# Patient Record
Sex: Male | Born: 1945 | Race: White | Hispanic: No | Marital: Married | Smoking: Never smoker
Health system: Southern US, Community
[De-identification: ages and names within clinical notes are randomized; demographics above are authoritative.]

## PROBLEM LIST (undated history)

## (undated) DIAGNOSIS — Z87898 Personal history of other specified conditions: Secondary | ICD-10-CM

## (undated) DIAGNOSIS — E785 Hyperlipidemia, unspecified: Secondary | ICD-10-CM

## (undated) DIAGNOSIS — Z9861 Coronary angioplasty status: Secondary | ICD-10-CM

## (undated) DIAGNOSIS — Z9889 Other specified postprocedural states: Secondary | ICD-10-CM

## (undated) DIAGNOSIS — R011 Cardiac murmur, unspecified: Secondary | ICD-10-CM

## (undated) DIAGNOSIS — R112 Nausea with vomiting, unspecified: Secondary | ICD-10-CM

## (undated) DIAGNOSIS — I10 Essential (primary) hypertension: Secondary | ICD-10-CM

## (undated) DIAGNOSIS — Z955 Presence of coronary angioplasty implant and graft: Secondary | ICD-10-CM

## (undated) DIAGNOSIS — R7303 Prediabetes: Secondary | ICD-10-CM

## (undated) DIAGNOSIS — I2119 ST elevation (STEMI) myocardial infarction involving other coronary artery of inferior wall: Secondary | ICD-10-CM

## (undated) DIAGNOSIS — K469 Unspecified abdominal hernia without obstruction or gangrene: Secondary | ICD-10-CM

## (undated) DIAGNOSIS — G4733 Obstructive sleep apnea (adult) (pediatric): Secondary | ICD-10-CM

## (undated) DIAGNOSIS — Z889 Allergy status to unspecified drugs, medicaments and biological substances status: Secondary | ICD-10-CM

## (undated) DIAGNOSIS — N401 Enlarged prostate with lower urinary tract symptoms: Secondary | ICD-10-CM

## (undated) DIAGNOSIS — E119 Type 2 diabetes mellitus without complications: Secondary | ICD-10-CM

## (undated) DIAGNOSIS — I2510 Atherosclerotic heart disease of native coronary artery without angina pectoris: Secondary | ICD-10-CM

## (undated) DIAGNOSIS — M1990 Unspecified osteoarthritis, unspecified site: Secondary | ICD-10-CM

## (undated) HISTORY — DX: Unspecified osteoarthritis, unspecified site: M19.90

## (undated) HISTORY — DX: Hyperlipidemia, unspecified: E78.5

## (undated) HISTORY — PX: INGUINAL HERNIA REPAIR: SUR1180

## (undated) HISTORY — DX: Atherosclerotic heart disease of native coronary artery without angina pectoris: I25.10

## (undated) HISTORY — DX: ST elevation (STEMI) myocardial infarction involving other coronary artery of inferior wall: I21.19

## (undated) HISTORY — DX: Essential (primary) hypertension: I10

## (undated) HISTORY — DX: Atherosclerotic heart disease of native coronary artery without angina pectoris: Z98.61

## (undated) HISTORY — DX: Allergy status to unspecified drugs, medicaments and biological substances: Z88.9

---

## 1980-05-25 HISTORY — PX: LUMBAR DISC SURGERY: SHX700

## 1999-09-01 ENCOUNTER — Encounter: Payer: Self-pay | Admitting: General Surgery

## 1999-09-02 ENCOUNTER — Ambulatory Visit (HOSPITAL_COMMUNITY): Admission: RE | Admit: 1999-09-02 | Discharge: 1999-09-02 | Payer: Self-pay | Admitting: General Surgery

## 1999-09-02 HISTORY — PX: INGUINAL HERNIA REPAIR: SHX194

## 1999-11-23 ENCOUNTER — Ambulatory Visit (HOSPITAL_COMMUNITY): Admission: RE | Admit: 1999-11-23 | Discharge: 1999-11-23 | Payer: Self-pay | Admitting: Specialist

## 1999-11-23 ENCOUNTER — Encounter: Payer: Self-pay | Admitting: Specialist

## 1999-12-19 ENCOUNTER — Encounter: Payer: Self-pay | Admitting: Specialist

## 1999-12-19 ENCOUNTER — Ambulatory Visit (HOSPITAL_COMMUNITY): Admission: RE | Admit: 1999-12-19 | Discharge: 1999-12-19 | Payer: Self-pay | Admitting: Specialist

## 2004-05-22 ENCOUNTER — Encounter: Admission: RE | Admit: 2004-05-22 | Discharge: 2004-05-22 | Payer: Self-pay | Admitting: Cardiology

## 2004-05-25 DIAGNOSIS — I2510 Atherosclerotic heart disease of native coronary artery without angina pectoris: Secondary | ICD-10-CM

## 2004-05-25 HISTORY — DX: Atherosclerotic heart disease of native coronary artery without angina pectoris: I25.10

## 2004-05-25 HISTORY — PX: CORONARY ANGIOPLASTY WITH STENT PLACEMENT: SHX49

## 2004-05-27 HISTORY — PX: CARDIAC CATHETERIZATION: SHX172

## 2004-06-18 ENCOUNTER — Ambulatory Visit (HOSPITAL_COMMUNITY): Admission: RE | Admit: 2004-06-18 | Discharge: 2004-06-19 | Payer: Self-pay | Admitting: Cardiology

## 2004-06-18 DIAGNOSIS — I2510 Atherosclerotic heart disease of native coronary artery without angina pectoris: Secondary | ICD-10-CM | POA: Insufficient documentation

## 2004-06-18 DIAGNOSIS — Z9861 Coronary angioplasty status: Secondary | ICD-10-CM

## 2004-06-18 HISTORY — PX: CORONARY ANGIOPLASTY WITH STENT PLACEMENT: SHX49

## 2005-12-18 HISTORY — PX: KNEE ARTHROSCOPY W/ MENISCAL REPAIR: SHX1877

## 2006-12-09 ENCOUNTER — Inpatient Hospital Stay (HOSPITAL_COMMUNITY): Admission: EM | Admit: 2006-12-09 | Discharge: 2006-12-15 | Payer: Self-pay | Admitting: Emergency Medicine

## 2006-12-22 ENCOUNTER — Encounter: Admission: RE | Admit: 2006-12-22 | Discharge: 2006-12-22 | Payer: Self-pay | Admitting: Orthopedic Surgery

## 2008-08-24 IMAGING — CR DG THORACIC SPINE 2V
3 series · 3 of 3 positions shown · non-contrast
Comparison: None.

Exam: Thoracic spine, 2 views.

HISTORY: Fell from ladder.

[t t-spine a.p.]
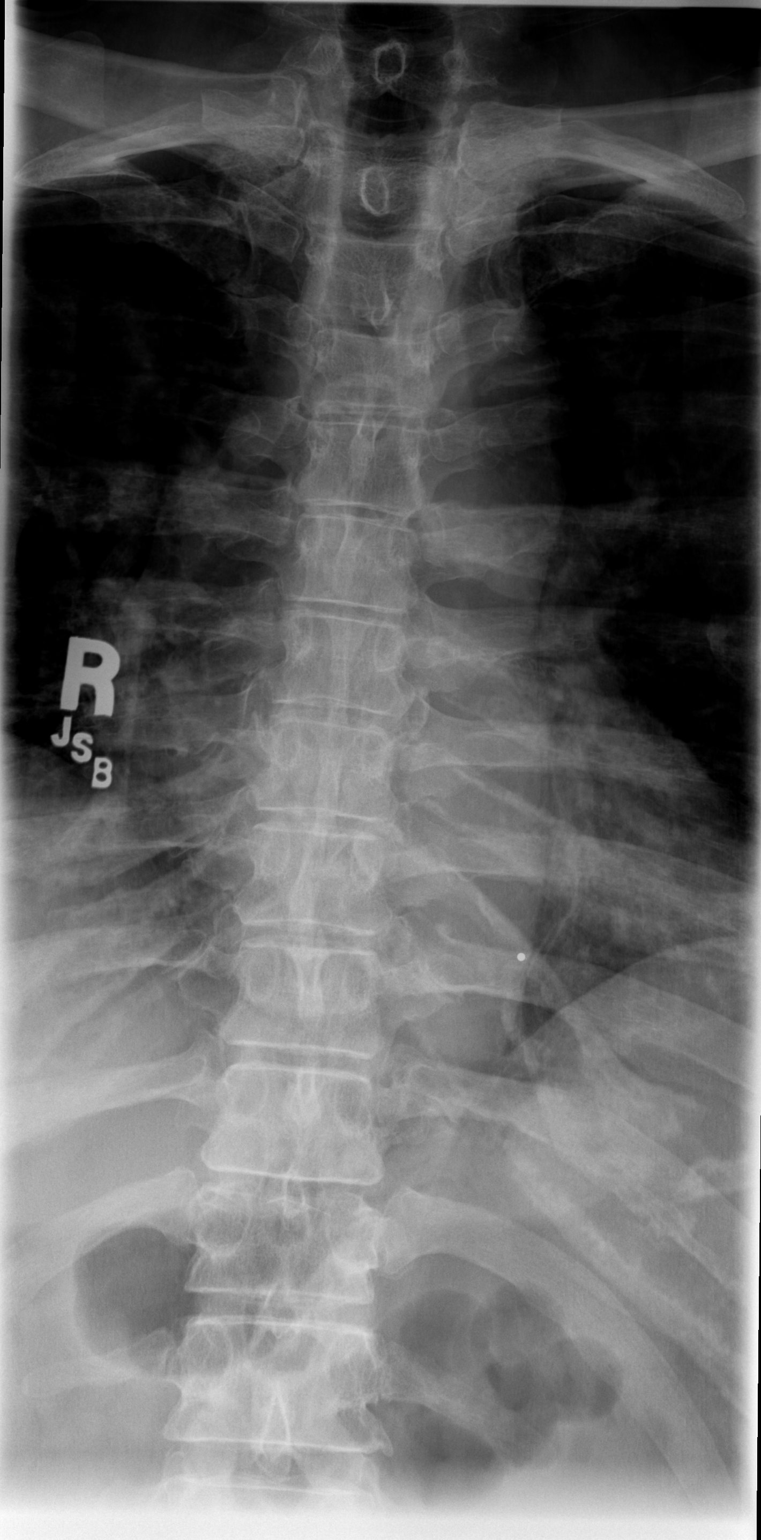

[t t-spine lat]
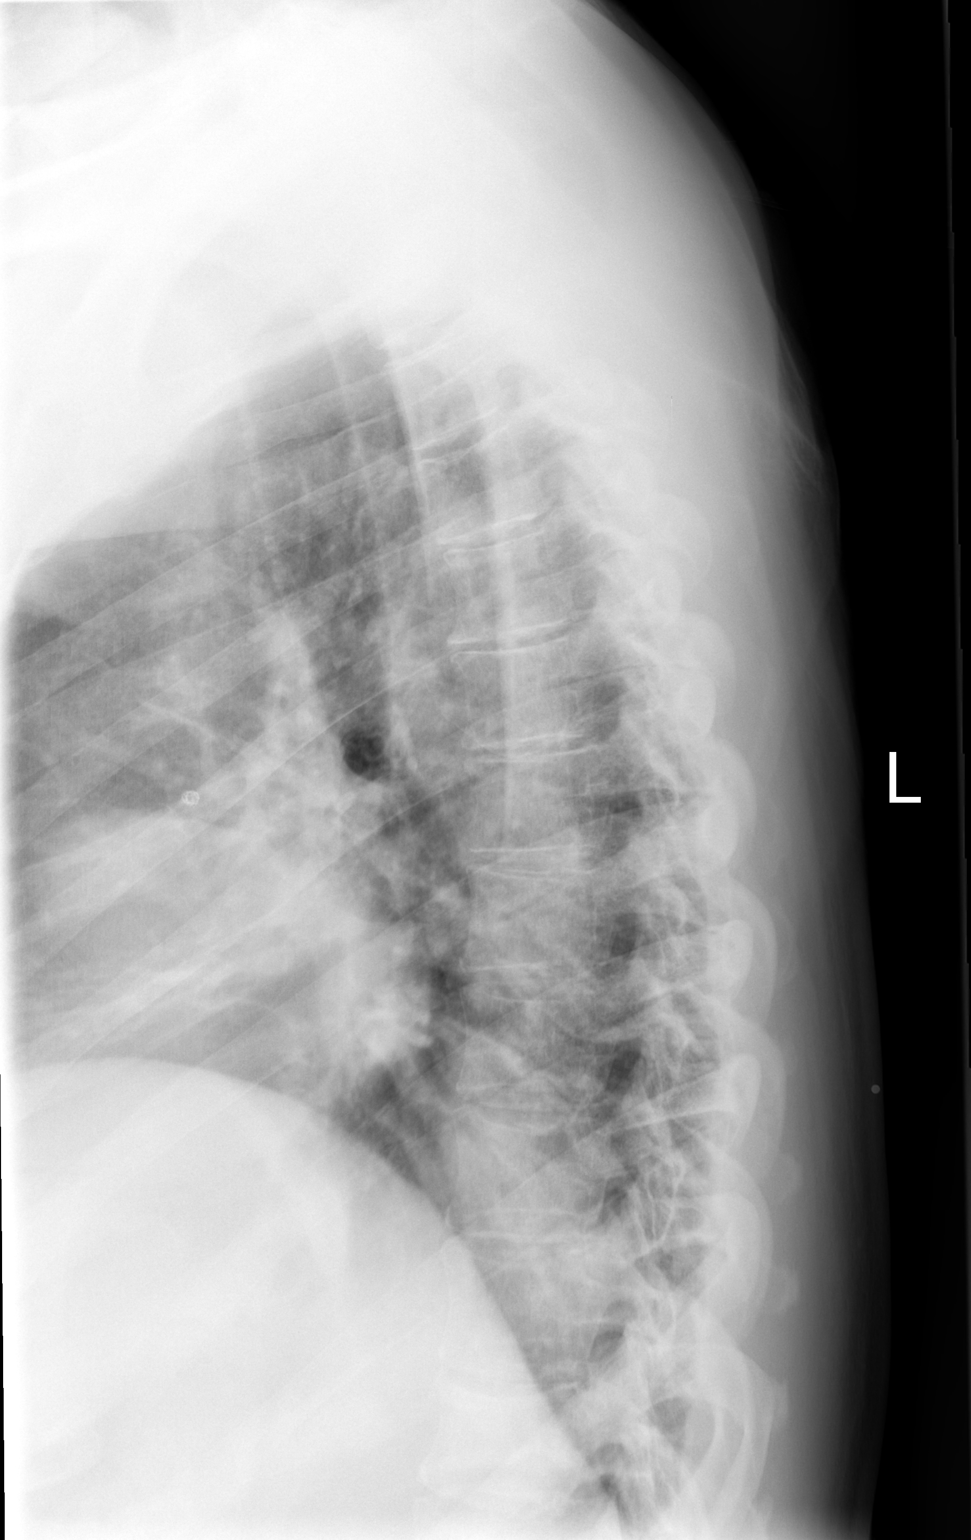

[t swimmers]
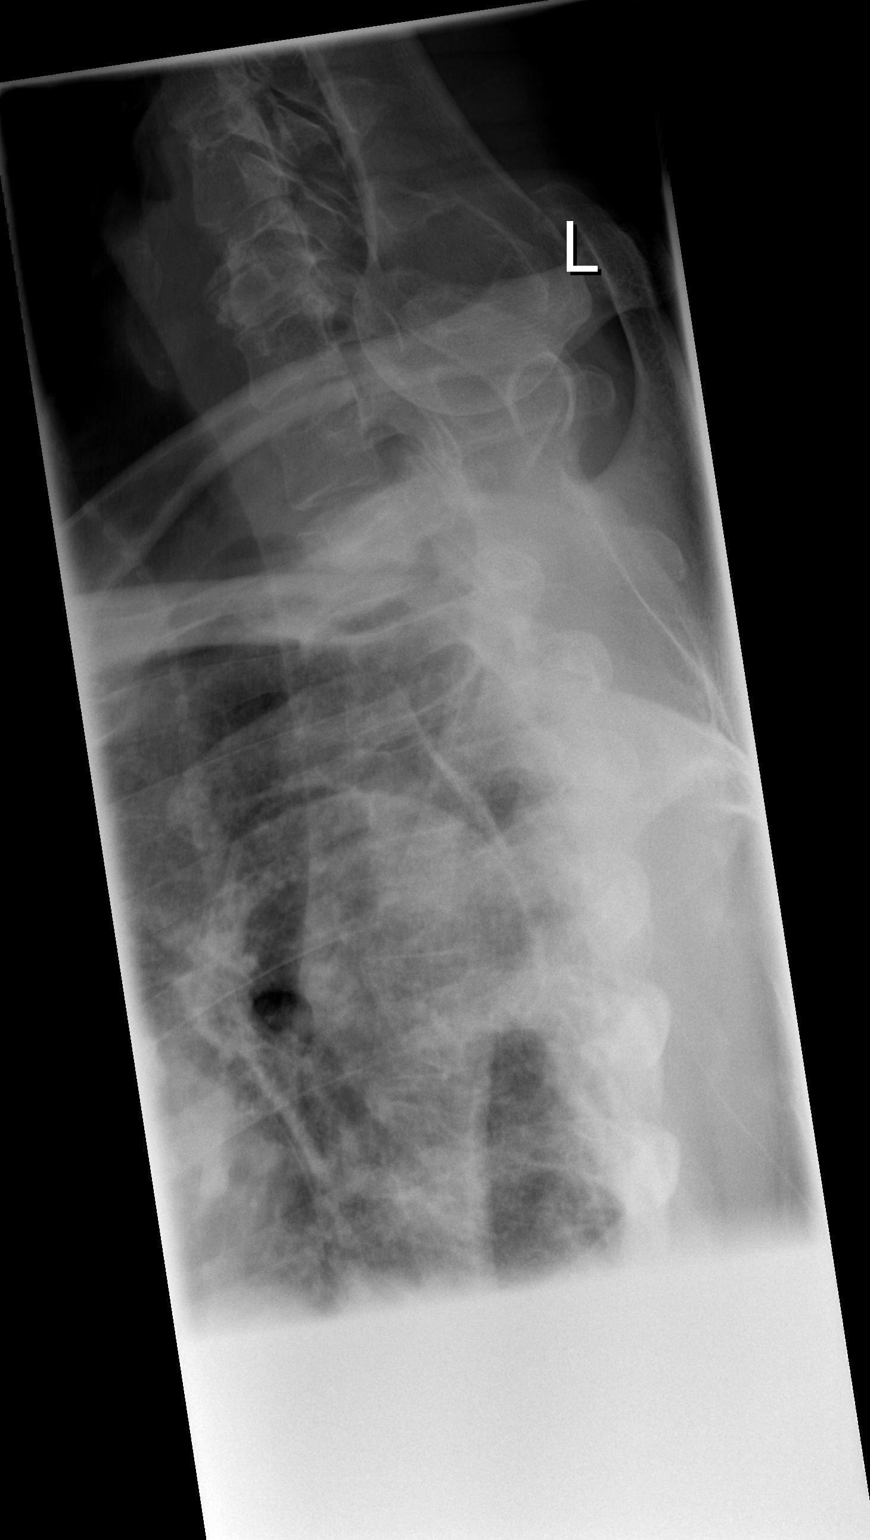

[3 of 3 positions shown; findings below may reference images not displayed]

FINDINGS: The alignment of the thoracic spine is normal.

The vertebral body heights and disc spaces are well-preserved.

The pedicles and spinous processes appear intact.
IMPRESSION: 1. No acute findings.

## 2008-08-24 IMAGING — CR DG PELVIS 3+V JUDET
3 series · 3 of 3 positions shown · non-contrast
Comparison: CT 12/08/2006

CLINICAL DATA: 61-year-old, transforaminal sacral fracture on the right.
 PELVIS - 3 VIEW:

[t pelvis a.p.]
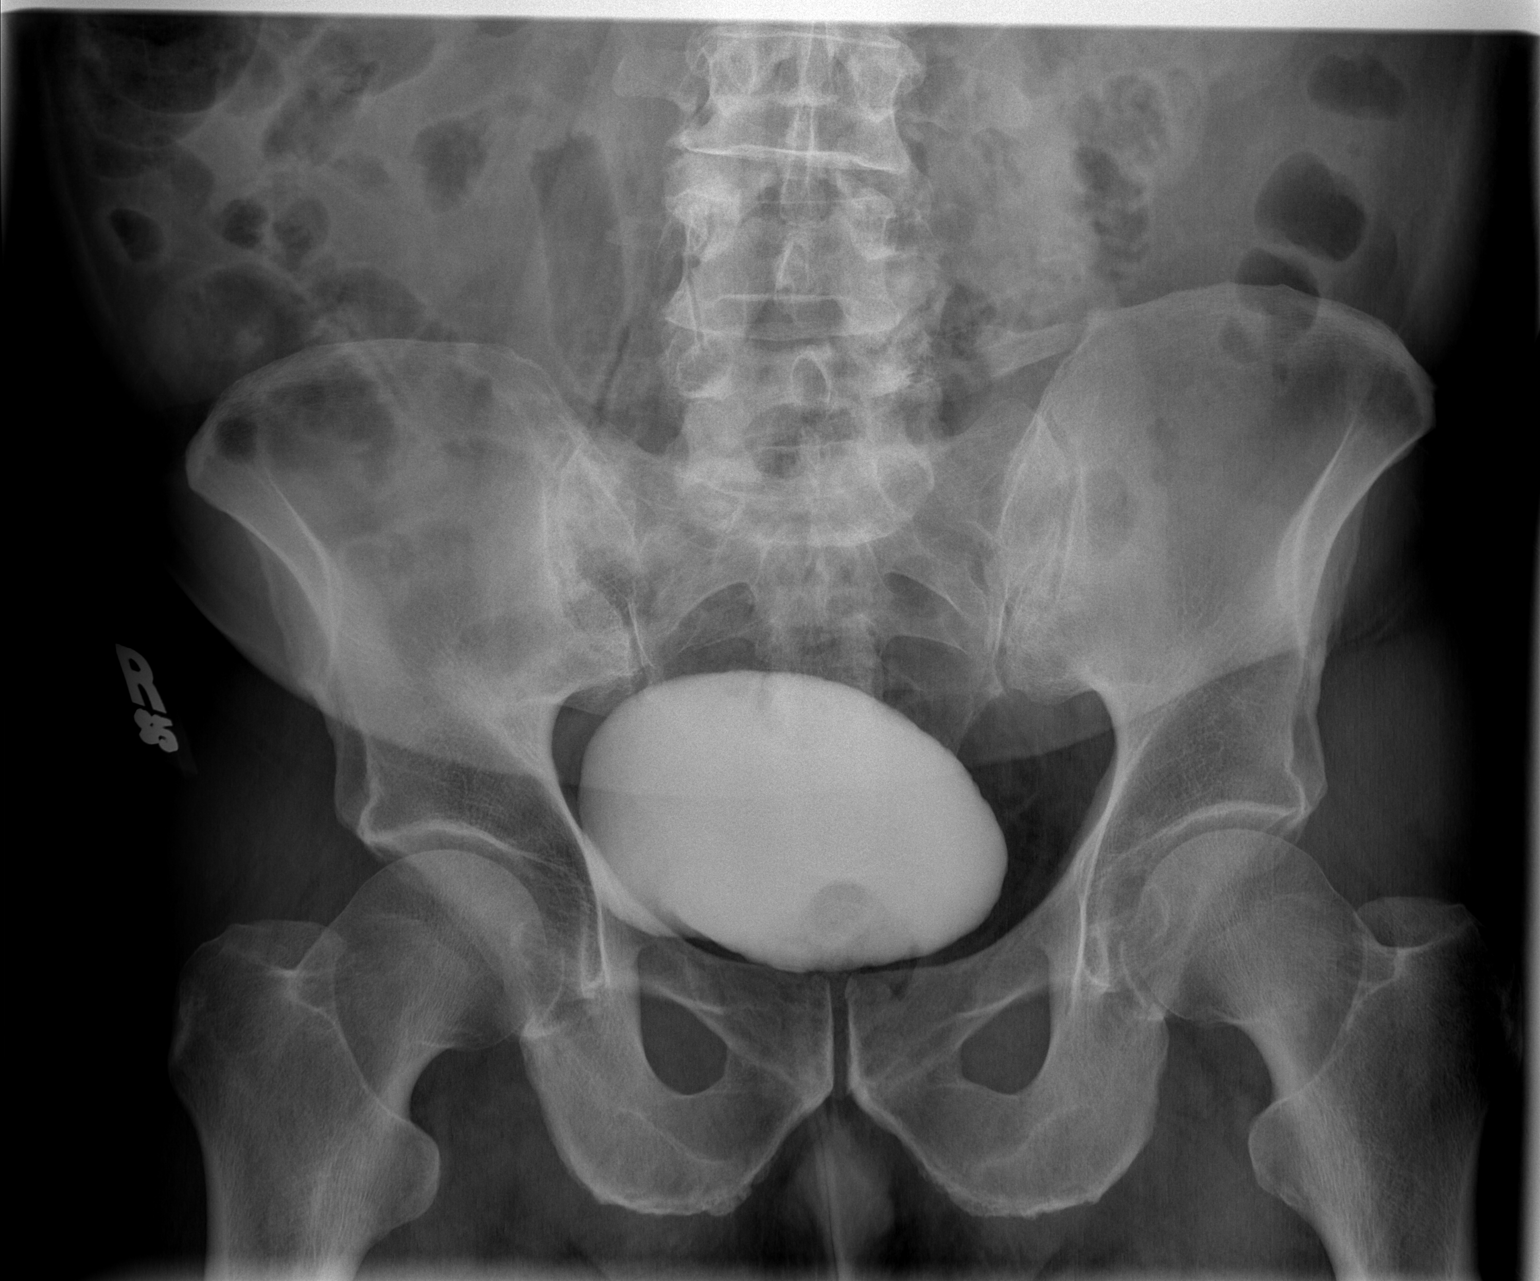

[t judet (1 of 2)]
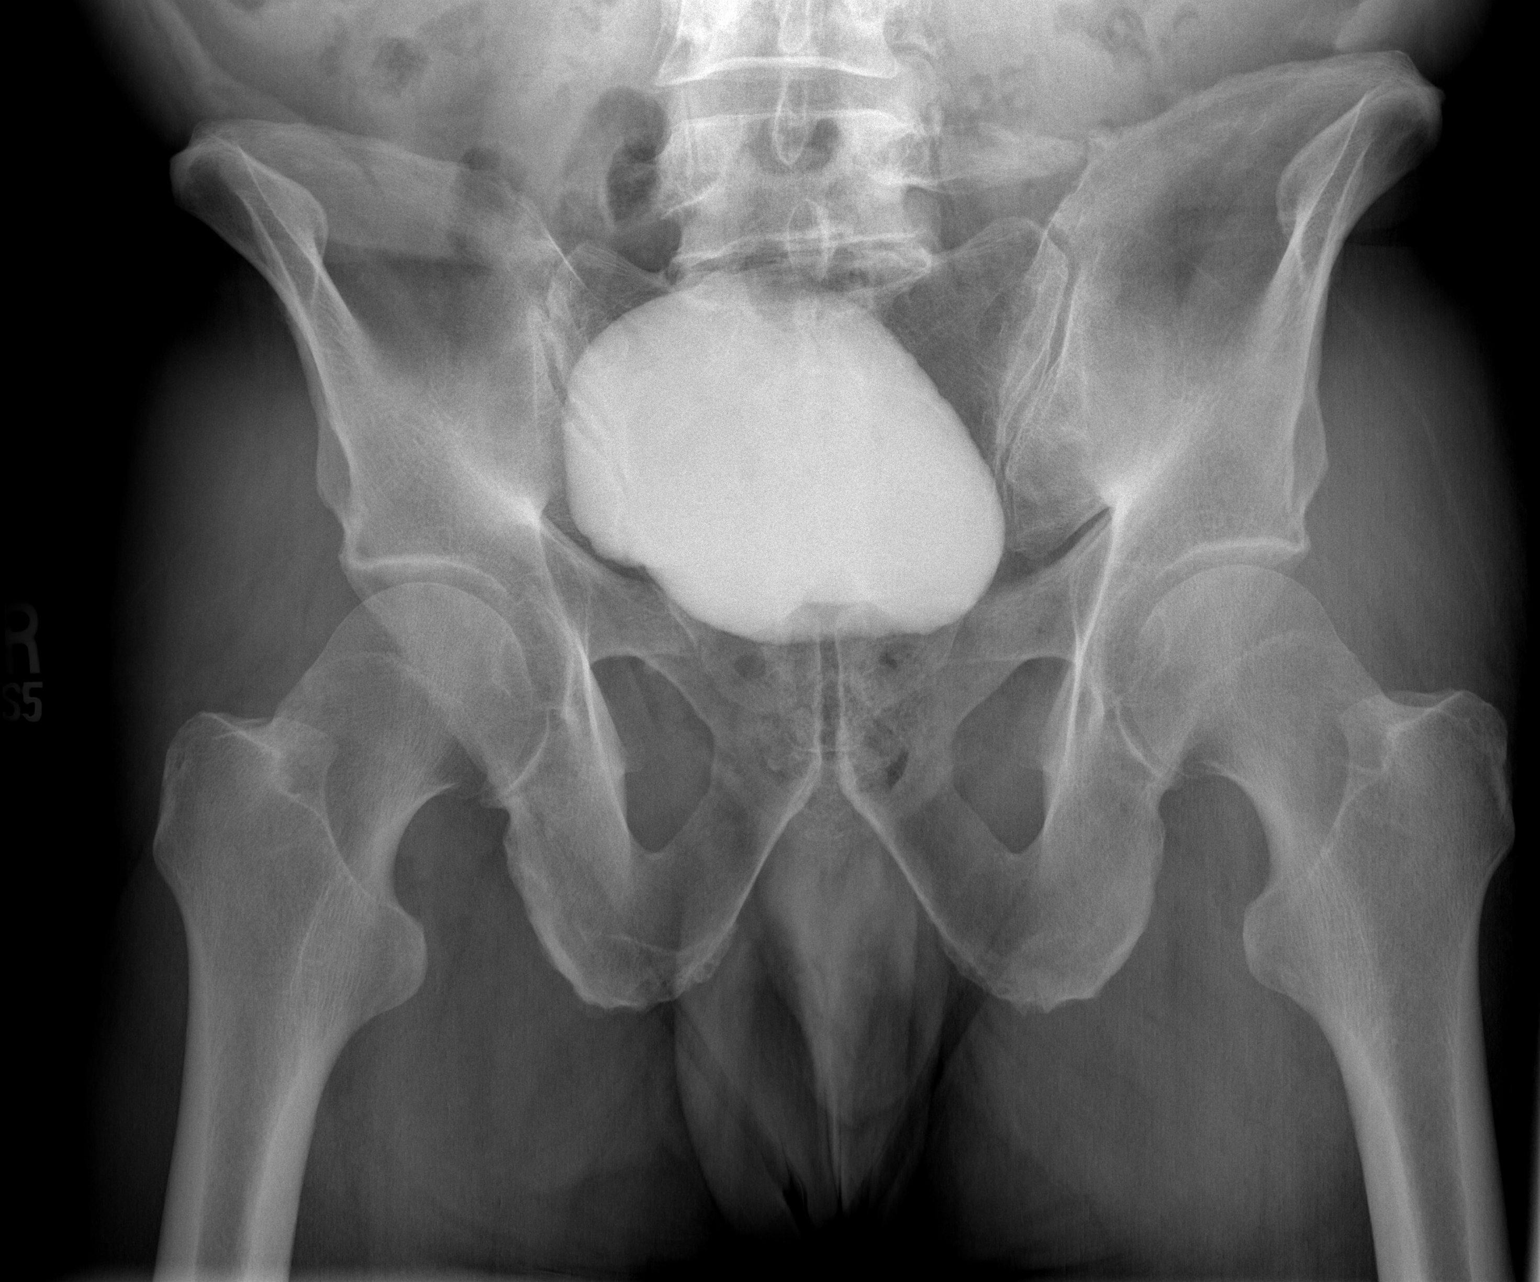

[t judet (2 of 2)]
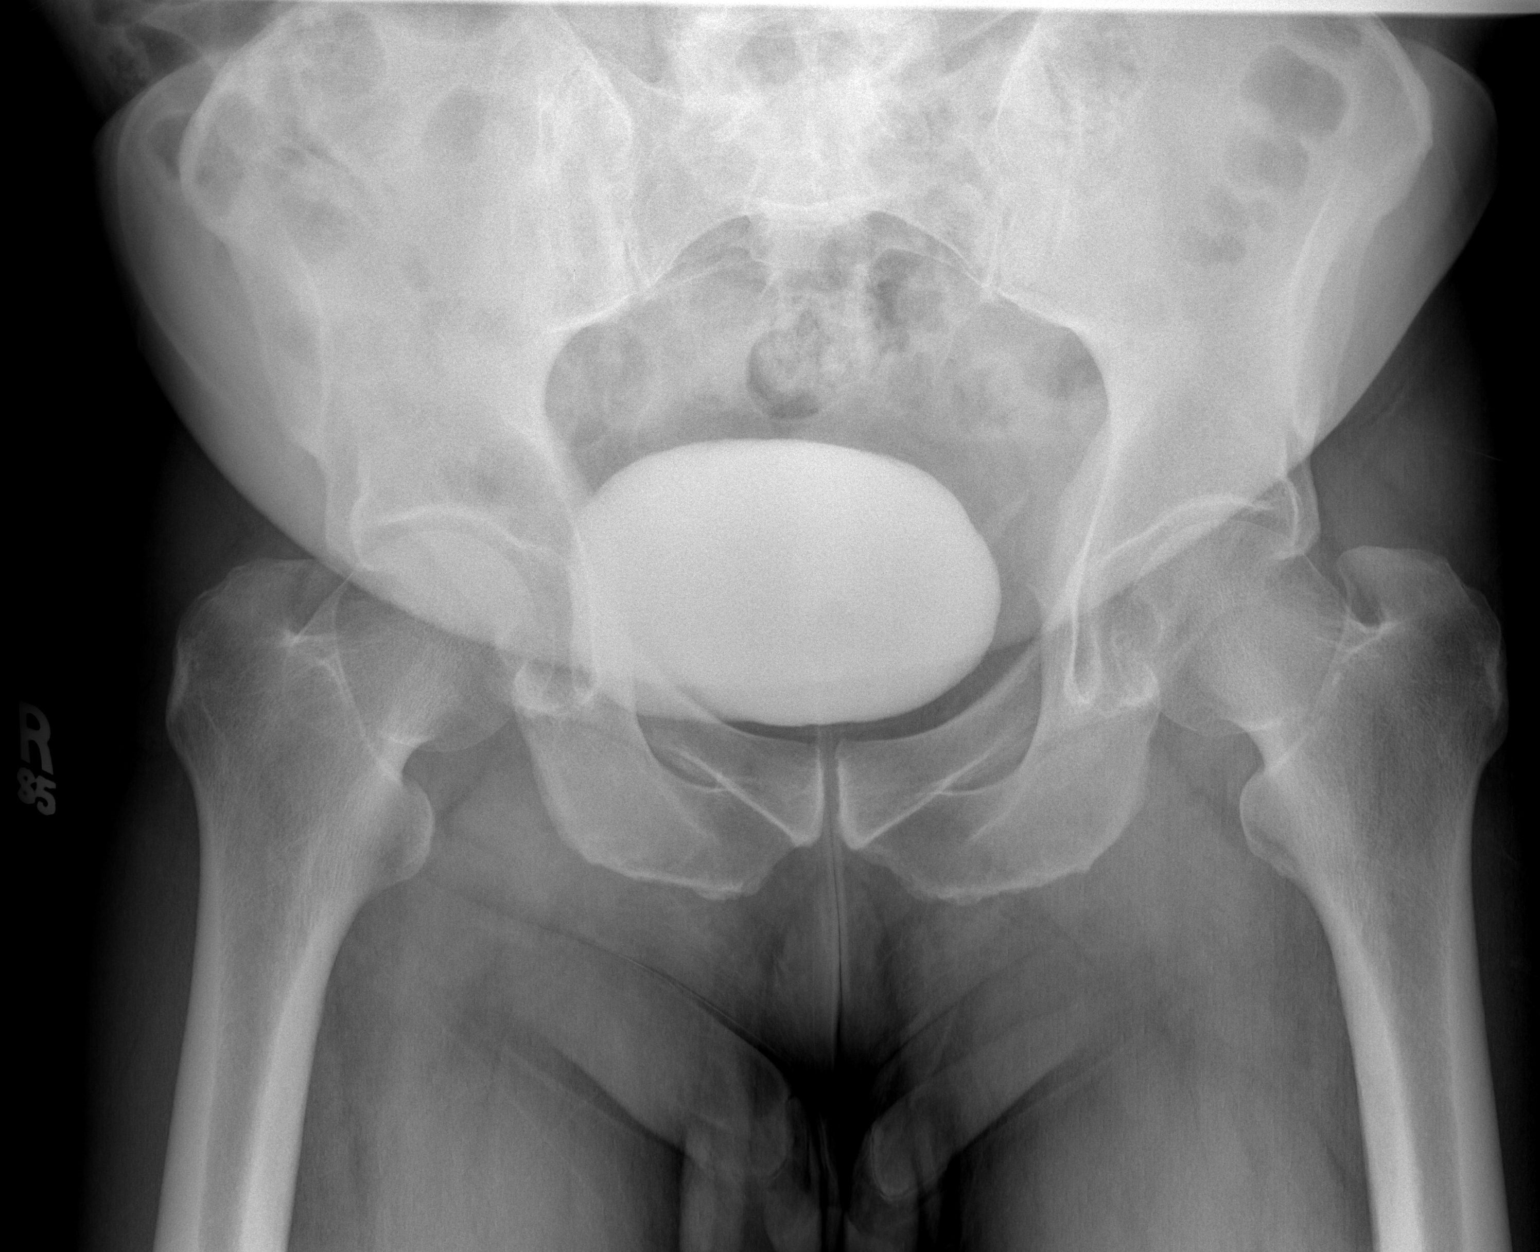

[3 of 3 positions shown; findings below may reference images not displayed]

FINDINGS: SI joints and pubic symphysis are intact.  The right sacral fractures are not demonstrated.  Both hips are located.  Contrast noted in the bladder.
IMPRESSION: Right sacral fractures seen on CT are not demonstrated on plain films.

## 2008-08-24 IMAGING — CT CT PELVIS W/ CM
2 of 5 series · 16 of 46 positions shown, 18 images · IV contrast (75ML OMNI 300)
Comparison: None

ABDOMEN CT WITH CONTRAST

CLINICAL DATA: Fall
TECHNIQUE: Multidetector CT imaging of the abdomen and pelvis was performed
following the standard protocol during bolus administration of intravenous
contrast.

Contrast:  75 cc Omnipaque 300

[Series 2: routine abdomen · axial · 0.89mm/px · z∈[-535,-45]mm · 13 of 110 slices shown, 15 images]
[im 6/110  soft-tissue]
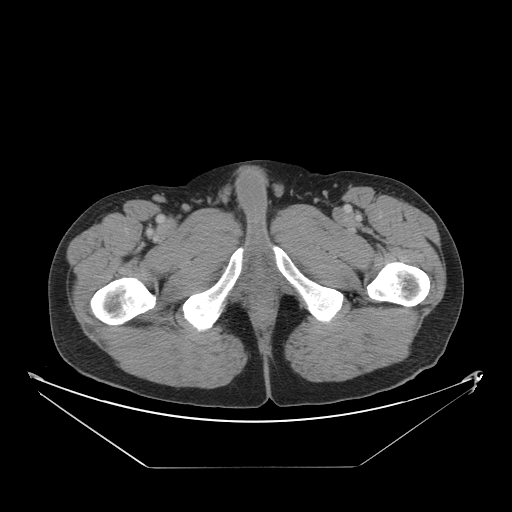
[im 6/110  bone]
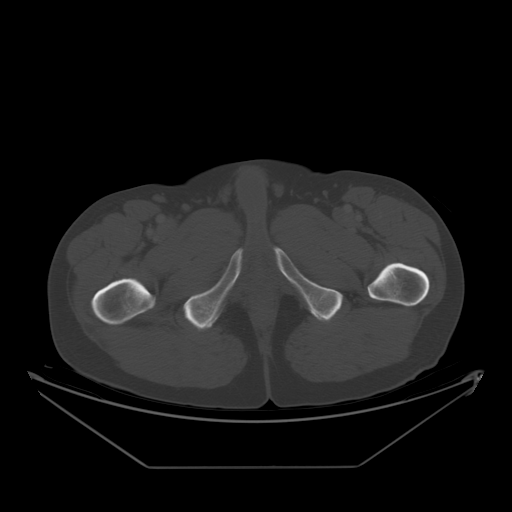
[im 17/110  soft-tissue]
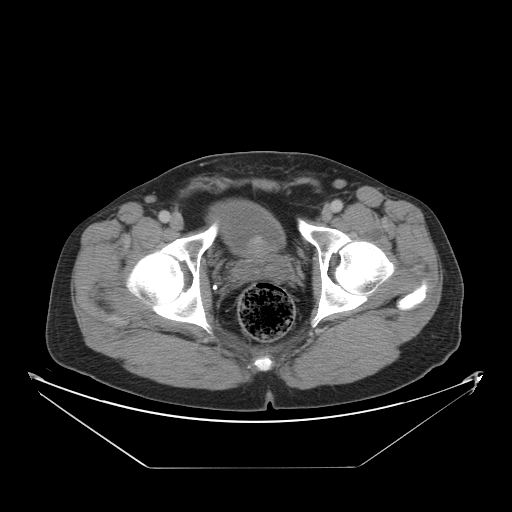
[im 22/110  soft-tissue]
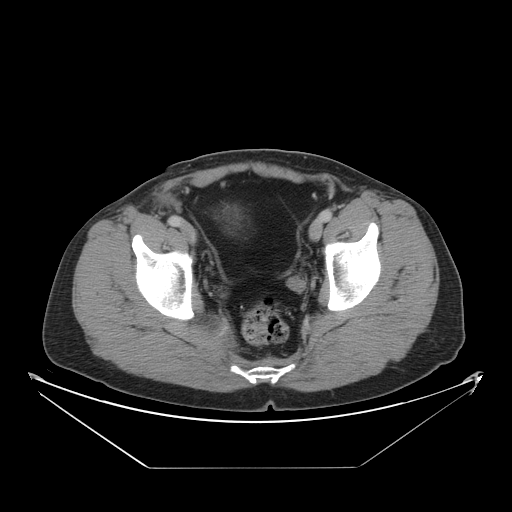
[im 33/110  soft-tissue]
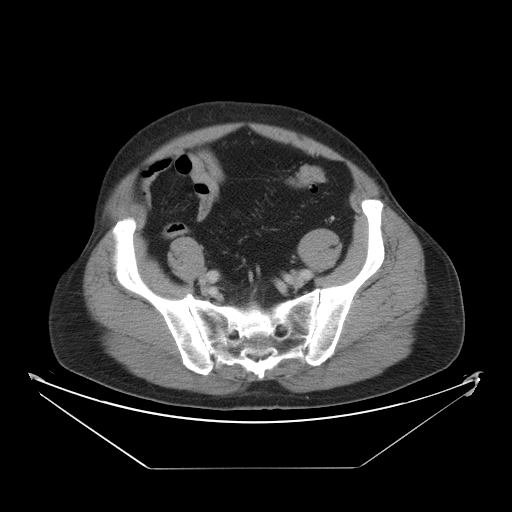
[im 39/110  soft-tissue]
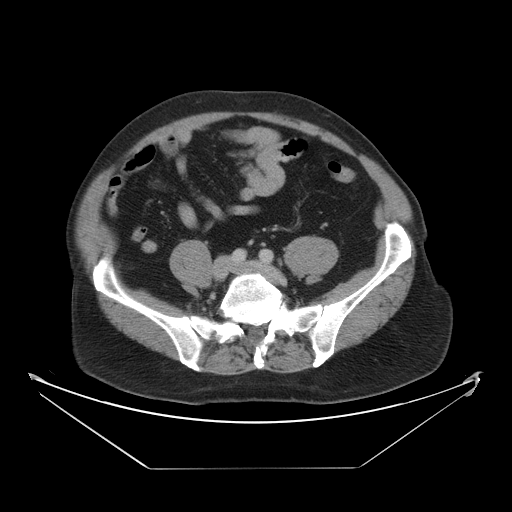
[im 50/110  soft-tissue]
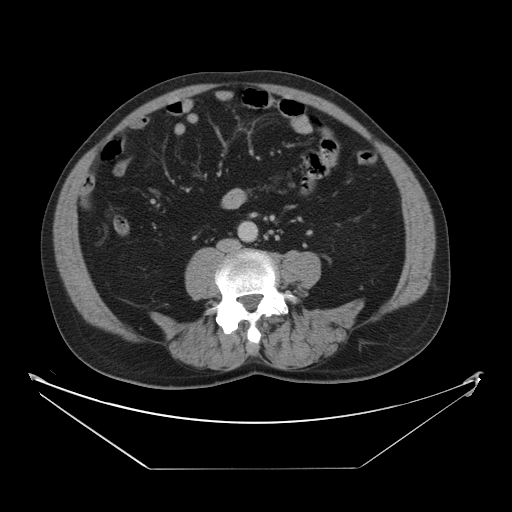
[im 55/110  soft-tissue]
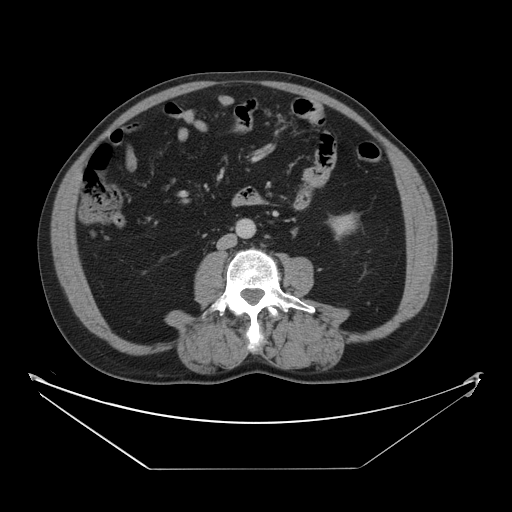
[im 60/110  soft-tissue]
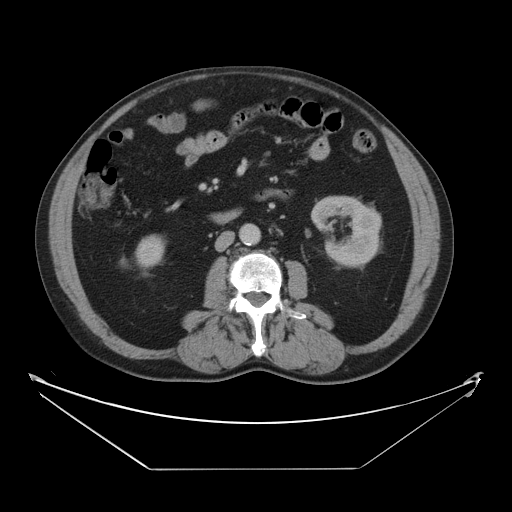
[im 71/110  soft-tissue]
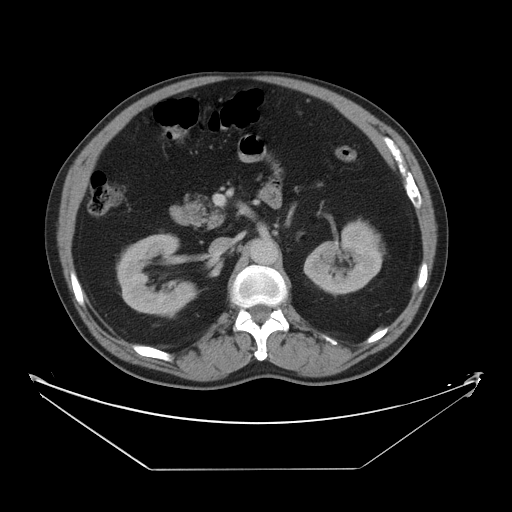
[im 71/110  bone]
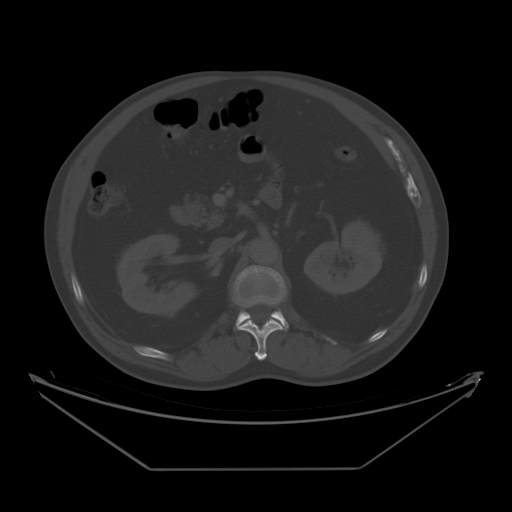
[im 77/110  soft-tissue]
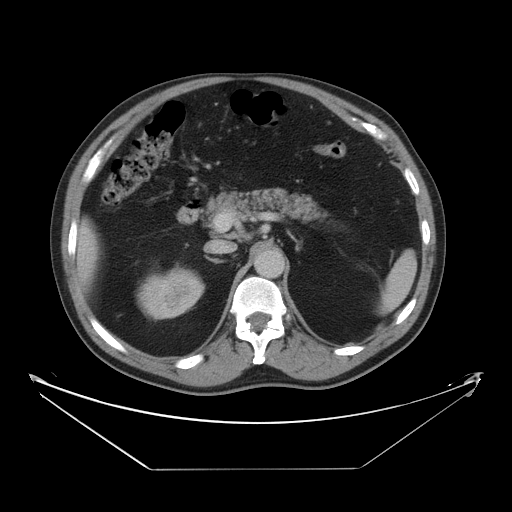
[im 88/110  soft-tissue]
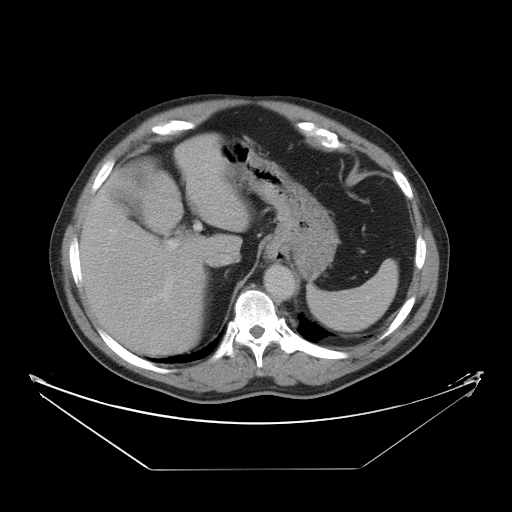
[im 93/110  soft-tissue]
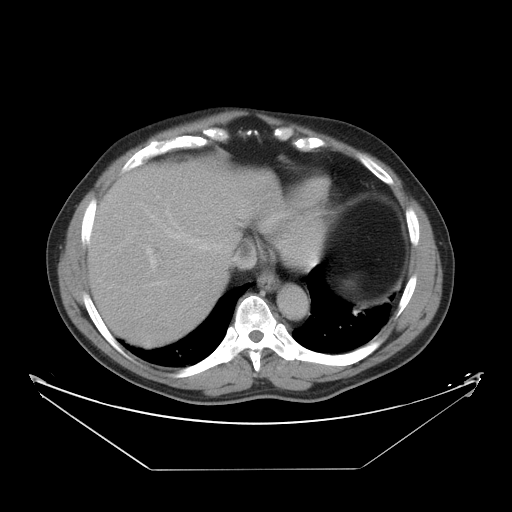
[im 104/110  soft-tissue]
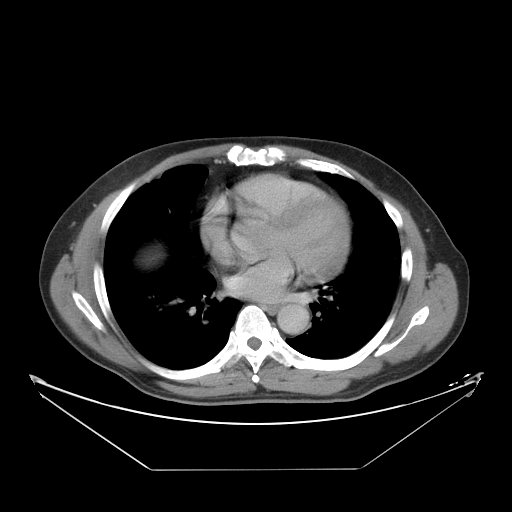

[Series 401: reformatted · coronal · 1.09mm/px · 3 of 151 slices shown]
[im 51/151  soft-tissue]
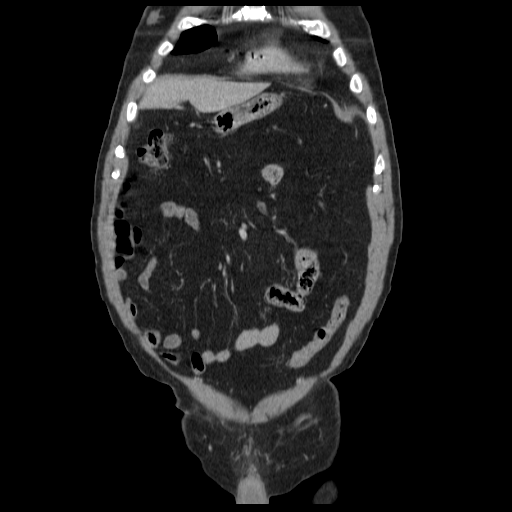
[im 67/151  soft-tissue]
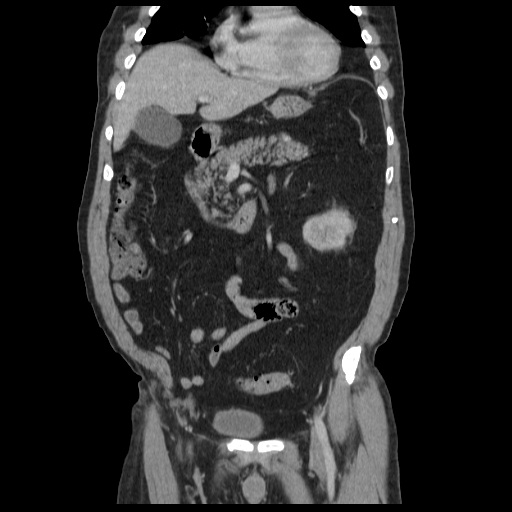
[im 84/151  soft-tissue]
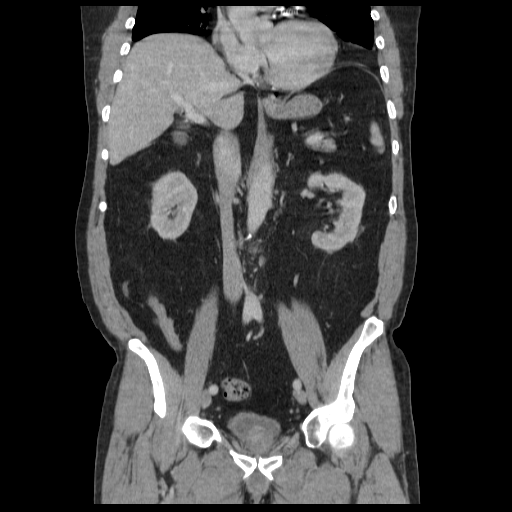

[16 of 46 positions shown; findings below may reference images not displayed]

FINDINGS: Atelectasis within the right middle lobe and both lower lobes is
noted.

The liver is normal in attenuation and morphology.

Gallbladder negative.

Pancreas negative.

Adrenal glands are negative.

Fluid density nodule within the right kidney measures 11 mm, image 42. This
likely represents a simple cyst. There is a cyst within a cortex of the upper
pole of the right kidney, image 36. Small cyst arises from the upper pole of the
left kidney. There is no evidence for renal contusion.

Spleen is intact.

No free fluid.

Negative for a large retroperitoneal, or small bowel mesenteric adenopathy.

The visualized bowel loops of the upper abdomen are negative. 

There is a thin fracture involving the right anterior lower rib. 

There is a fracture which extends through the anterior and superior aspect of
T11 vertebral body. There is no loss of vertebral body height. No retropulsion
of fracture fragments into the spinal canal noted. 

There is a fracture which extends from the right SI joint through the right
sacral wing.

IMPRESSION

1. No evidence for solid organ injury.
2. Nondisplaced fractures involve the right ~10th rib, T11 vertebral body, and
the right sacral wing fracture.

PELVIS CT WITH CONTRAST
FINDINGS: Within the base of the urinary bladder there is a focal area of high
density measuring 2.3 x 2.2 cm. This appears to extend from the prostate gland.
This may represent prostate gland hypertrophy. However given its high density
measurements I cannot rule out blood clot. Careful clinical correlation is
recommended. 

There is no free fluid could negative for adenopathy. 

Small fat containing right inguinal hernia is noted. 

Pelvic bowel loops are significant for diverticulosis.
IMPRESSION: 1.  Focal area of high density within the base of the bladder to  appears to
extend from the prostate gland. I suspect this likely represents prostate gland
hypertrophy. Blood clot is not excluded. Careful clinical correlation
recommended.

## 2008-12-17 ENCOUNTER — Observation Stay (HOSPITAL_COMMUNITY): Admission: AD | Admit: 2008-12-17 | Discharge: 2008-12-19 | Payer: Self-pay | Admitting: Family Medicine

## 2008-12-17 ENCOUNTER — Ambulatory Visit: Payer: Self-pay | Admitting: Family Medicine

## 2009-01-23 HISTORY — PX: CAROTID STENT: SHX1301

## 2009-01-23 HISTORY — PX: CORONARY ANGIOPLASTY WITH STENT PLACEMENT: SHX49

## 2009-02-16 ENCOUNTER — Ambulatory Visit: Payer: Self-pay | Admitting: Cardiovascular Disease

## 2009-02-16 ENCOUNTER — Inpatient Hospital Stay (HOSPITAL_COMMUNITY): Admission: RE | Admit: 2009-02-16 | Discharge: 2009-02-19 | Payer: Self-pay | Admitting: Cardiology

## 2009-02-16 DIAGNOSIS — I2101 ST elevation (STEMI) myocardial infarction involving left main coronary artery: Secondary | ICD-10-CM | POA: Insufficient documentation

## 2009-02-16 DIAGNOSIS — I214 Non-ST elevation (NSTEMI) myocardial infarction: Secondary | ICD-10-CM | POA: Insufficient documentation

## 2009-02-16 DIAGNOSIS — I2102 ST elevation (STEMI) myocardial infarction involving left anterior descending coronary artery: Secondary | ICD-10-CM | POA: Insufficient documentation

## 2009-02-16 DIAGNOSIS — I2119 ST elevation (STEMI) myocardial infarction involving other coronary artery of inferior wall: Secondary | ICD-10-CM | POA: Insufficient documentation

## 2009-02-16 DIAGNOSIS — I252 Old myocardial infarction: Secondary | ICD-10-CM

## 2009-02-16 HISTORY — DX: ST elevation (STEMI) myocardial infarction involving other coronary artery of inferior wall: I21.19

## 2009-02-16 HISTORY — PX: CORONARY ANGIOPLASTY WITH STENT PLACEMENT: SHX49

## 2009-02-16 HISTORY — DX: Old myocardial infarction: I25.2

## 2009-02-16 HISTORY — PX: ERCP W/ METAL STENT PLACEMENT: SHX1521

## 2010-03-12 ENCOUNTER — Ambulatory Visit (HOSPITAL_COMMUNITY): Admission: RE | Admit: 2010-03-12 | Discharge: 2010-03-12 | Payer: Self-pay | Admitting: Orthopedic Surgery

## 2010-05-22 ENCOUNTER — Encounter
Admission: RE | Admit: 2010-05-22 | Discharge: 2010-05-22 | Payer: Self-pay | Source: Home / Self Care | Attending: Neurology | Admitting: Neurology

## 2010-08-29 LAB — URINALYSIS, ROUTINE W REFLEX MICROSCOPIC
Ketones, ur: NEGATIVE mg/dL
Nitrite: NEGATIVE
Protein, ur: NEGATIVE mg/dL
Urobilinogen, UA: 0.2 mg/dL (ref 0.0–1.0)

## 2010-08-29 LAB — COMPREHENSIVE METABOLIC PANEL
ALT: 23 U/L (ref 0–53)
AST: 47 U/L — ABNORMAL HIGH (ref 0–37)
AST: 50 U/L — ABNORMAL HIGH (ref 0–37)
Albumin: 3.1 g/dL — ABNORMAL LOW (ref 3.5–5.2)
Albumin: 3.4 g/dL — ABNORMAL LOW (ref 3.5–5.2)
Alkaline Phosphatase: 59 U/L (ref 39–117)
Alkaline Phosphatase: 65 U/L (ref 39–117)
CO2: 23 mEq/L (ref 19–32)
Calcium: 8.7 mg/dL (ref 8.4–10.5)
Chloride: 108 mEq/L (ref 96–112)
GFR calc Af Amer: >60 mL/min (ref >60)
GFR calc Af Amer: >60 mL/min (ref >60)
Glucose, Bld: 100 mg/dL — ABNORMAL HIGH (ref 70–99)
Potassium: 3.7 mEq/L (ref 3.5–5.1)
Potassium: 3.8 mEq/L (ref 3.5–5.1)
Sodium: 136 mEq/L (ref 135–145)
Total Bilirubin: 0.5 mg/dL (ref 0.3–1.2)
Total Protein: 6.2 g/dL (ref 6.0–8.3)

## 2010-08-29 LAB — BASIC METABOLIC PANEL
BUN: 11 mg/dL (ref 6–23)
Calcium: 8.9 mg/dL (ref 8.4–10.5)
Creatinine, Ser: 0.74 mg/dL (ref 0.4–1.5)
GFR calc Af Amer: >60 mL/min (ref >60)
GFR calc non Af Amer: >60 mL/min (ref >60)

## 2010-08-29 LAB — POCT I-STAT, CHEM 8
HCT: 31 % — ABNORMAL LOW (ref 39.0–52.0)
Hemoglobin: 10.5 g/dL — ABNORMAL LOW (ref 13.0–17.0)
Potassium: 2.8 mEq/L — ABNORMAL LOW (ref 3.5–5.1)
Sodium: 149 mEq/L — ABNORMAL HIGH (ref 135–145)
TCO2: 17 mmol/L (ref 0–100)

## 2010-08-29 LAB — CBC
HCT: 36.4 % — ABNORMAL LOW (ref 39.0–52.0)
Hemoglobin: 13.3 g/dL (ref 13.0–17.0)
MCHC: 34 g/dL (ref 30.0–36.0)
MCHC: 34.2 g/dL (ref 30.0–36.0)
MCV: 93 fL (ref 78.0–100.0)
MCV: 94.1 fL (ref 78.0–100.0)
Platelets: 227 10*3/uL (ref 150–400)
Platelets: 248 10*3/uL (ref 150–400)
RBC: 3.87 MIL/uL — ABNORMAL LOW (ref 4.22–5.81)
RBC: 4.16 MIL/uL — ABNORMAL LOW (ref 4.22–5.81)
RBC: 4.2 MIL/uL — ABNORMAL LOW (ref 4.22–5.81)
RBC: 4.29 MIL/uL (ref 4.22–5.81)
RDW: 13.5 % (ref 11.5–15.5)
RDW: 13.6 % (ref 11.5–15.5)
WBC: 8.2 10*3/uL (ref 4.0–10.5)
WBC: 8.9 10*3/uL (ref 4.0–10.5)

## 2010-08-29 LAB — LIPID PANEL
Cholesterol: 88 mg/dL (ref 0–200)
HDL: 29 mg/dL — ABNORMAL LOW (ref >39)
HDL: 33 mg/dL — ABNORMAL LOW (ref >39)
Total CHOL/HDL Ratio: 2.7 RATIO
Total CHOL/HDL Ratio: 2.8 RATIO
Triglycerides: 49 mg/dL (ref <150)
VLDL: 10 mg/dL (ref 0–40)
VLDL: 13 mg/dL (ref 0–40)

## 2010-08-29 LAB — CARDIAC PANEL(CRET KIN+CKTOT+MB+TROPI)
CK, MB: 63.8 ng/mL — ABNORMAL HIGH (ref 0.3–4.0)
Relative Index: 15.4 — ABNORMAL HIGH (ref 0.0–2.5)
Relative Index: 17.7 — ABNORMAL HIGH (ref 0.0–2.5)
Relative Index: 20.9 — ABNORMAL HIGH (ref 0.0–2.5)
Relative Index: INVALID (ref 0.0–2.5)
Total CK: 413 U/L — ABNORMAL HIGH (ref 7–232)
Total CK: 514 U/L — ABNORMAL HIGH (ref 7–232)
Total CK: 64 U/L (ref 7–232)
Troponin I: 0.03 ng/mL (ref 0.00–0.06)
Troponin I: 16.84 ng/mL (ref 0.00–0.06)

## 2010-08-29 LAB — DIFFERENTIAL
Basophils Absolute: 0 10*3/uL (ref 0.0–0.1)
Basophils Relative: 0 % (ref 0–1)
Eosinophils Absolute: 0 10*3/uL (ref 0.0–0.7)
Monocytes Relative: 7 % (ref 3–12)
Neutro Abs: 6.9 10*3/uL (ref 1.7–7.7)
Neutrophils Relative %: 75 % (ref 43–77)

## 2010-08-29 LAB — HEMOGLOBIN A1C
Hgb A1c MFr Bld: 5.8 % (ref 4.6–6.1)
Mean Plasma Glucose: 120 mg/dL
Mean Plasma Glucose: 123 mg/dL

## 2010-08-31 LAB — BASIC METABOLIC PANEL
BUN: 18 mg/dL (ref 6–23)
Calcium: 8.2 mg/dL — ABNORMAL LOW (ref 8.4–10.5)
Chloride: 108 mEq/L (ref 96–112)
Creatinine, Ser: 0.82 mg/dL (ref 0.4–1.5)
GFR calc Af Amer: >60 mL/min (ref >60)

## 2010-08-31 LAB — DIFFERENTIAL
Basophils Relative: 0 % (ref 0–1)
Eosinophils Absolute: 0.1 10*3/uL (ref 0.0–0.7)
Lymphs Abs: 1.1 10*3/uL (ref 0.7–4.0)
Neutro Abs: 10.3 10*3/uL — ABNORMAL HIGH (ref 1.7–7.7)
Neutrophils Relative %: 83 % — ABNORMAL HIGH (ref 43–77)

## 2010-08-31 LAB — CBC
MCHC: 33.8 g/dL (ref 30.0–36.0)
MCHC: 33.9 g/dL (ref 30.0–36.0)
MCV: 94.3 fL (ref 78.0–100.0)
MCV: 94.5 fL (ref 78.0–100.0)
Platelets: 223 10*3/uL (ref 150–400)
Platelets: 259 10*3/uL (ref 150–400)
Platelets: 273 10*3/uL (ref 150–400)
RBC: 3.88 MIL/uL — ABNORMAL LOW (ref 4.22–5.81)
RBC: 4.01 MIL/uL — ABNORMAL LOW (ref 4.22–5.81)
RDW: 13.2 % (ref 11.5–15.5)
WBC: 12.4 10*3/uL — ABNORMAL HIGH (ref 4.0–10.5)
WBC: 6.5 10*3/uL (ref 4.0–10.5)

## 2010-08-31 LAB — CULTURE, BLOOD (ROUTINE X 2): Culture: NO GROWTH

## 2010-10-07 NOTE — Consult Note (Signed)
NAME:  NAJAE, RATHERT NO.:  [id number]   MEDICAL RECORD NO.:  [id number]          PATIENT TYPE:  EMS   LOCATION:  MAJO                         FACILITY:  [facility]   PHYSICIAN:  Doralee Albino. Carola Frost, M.D. DATE OF BIRTH:  July 22, 1945   DATE OF CONSULTATION:  12/08/2006  DATE OF DISCHARGE:                                 CONSULTATION   REASON FOR CONSULTATION:  Pelvic ring fracture.   BRIEF HISTORY AND PRESENTATION:  Chase Combs is a 65 year old white  male who fell about 14 feet sustaining sacral pain.  He was subsequently  found to have hematuria.  We are asked to consult regarding his pelvic  ring injury.  He denied motor loss in his foot and ankle but did report  some slight increase in difficulty lifting his right leg which, he  thought, may be secondary to pain.  He denied frank numbness or tingling  in his leg, but he states that toward the end of the day, his legs do  deep tend to go numb bilaterally.  His pain has been fairly constant but  mild to moderate.  No associated dysuria.   PAST MEDICAL HISTORY:  Hypertension, CAD.   PAST SURGICAL HISTORY:  Cardiac stent x2, coccyx removal, knee surgery,  bilateral inguinal hernia repairs.   SOCIAL HISTORY:  No tobacco.  No alcohol.   MEDICATIONS:  Plavix, Diovan, Norvasc, enteric-coated aspirin, vitamins,  Crestor.   ALLERGIES:  CODEINE.   REVIEW OF SYSTEMS:  Notable, again, for back pain, paresthesias in the  lower extremity toward the end of the day and now some mild paresthesia  in the right thumb after placement of the IV line.   PHYSICAL EXAMINATION:  GENERAL:  Mr. Roggenkamp is quite pleasant.  He  appears older than stated age.  He is not in any acute distress.  He is  conversant, alert and oriented x4.  MUSCULOSKELETAL:  Upper extremities, shoulders, elbows, wrists and hands  notable for the absence of focal tenderness, ecchymosis or crepitus.  Radial, median, ulnar sensory motor function is intact  bilaterally.  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  No  abnormality of the pulses appreciated.  Pelvis is minimally tender to  compression.  There is no instability.  He has absolutely no anterior  tenderness of his pelvic ring.  No major ecchymosis.  ABDOMEN:  His abdomen is protuberant but nontender.  LOWER EXTREMITIES:  Examination of the lower extremities is notable for  the absence of focal crepitus or ecchymosis or tenderness involving the  hips, knees, ankles and feet.  Superficial peroneal, deep peroneal and  tibial nerve sensory motor function are intact and symmetric  bilaterally.  L3 through S1 intact and symmetric bilaterally.  Dorsalis  pedis pulses palpable bilaterally.  Posterior tib 2+ bilaterally.  EHL,  ankle extension, flexion and eversion all 5/5 bilaterally.   A CT scan was reviewed of the pelvis which demonstrates a right-sided  transforaminal fracture.  No anterior ring injury is appreciated.  There  is no significant change in appearance of the  foramina and no  contralateral injury is visualized either.  There is no vertical or  other type of displacement.   ASSESSMENT:  Right-sided transforaminal sacral fracture with apparent  ring stability.   PLAN:  Mr. Fason should be able to weight bear as tolerated with PT and  OT.  I would like to obtain a baseline AP and inlet/outlet x-rays to  assess his fracture position and then these should be repeated after  mobilization for 1-2 days.  Remainder of his workup is deferred to  trauma and urology.  Incidental note of course was made of the renal  cyst and abdominal fluid collections.      Doralee Albino. Carola Frost, M.D.  Electronically Signed     MHH/MEDQ  D:  12/08/2006  T:  12/09/2006  Job:  [id number]

## 2010-10-07 NOTE — Consult Note (Signed)
NAME:  Chase Combs, Chase Combs NO.:  [id number]   MEDICAL RECORD NO.:  [id number]          PATIENT TYPE:  INP   LOCATION:  5007                         FACILITY:  [facility]   PHYSICIAN:  Cherylynn Ridges, M.D.    DATE OF BIRTH:  [birth year]   DATE OF CONSULTATION:  12/18/2008  DATE OF DISCHARGE:                                 CONSULTATION   HISTORY OF PRESENT ILLNESS:  We were asked to consult on this 65-year-  old white male who developed cellulitis on his right arm.  The patient  states that approximately a week ago, he has developed small red bump on  his right forearm.  He went to the [facility], subsequently  received p.o. antibiotics, and had an incision and drainage with  culture.  Apparently, the culture came back, a staph aureus that was  sensitive to both doxycycline and Bactrim.  The patient was placed on  p.o. antibiotics.  However, the patient's erythema, tenderness, and  drainage has picked up, and he subsequently returned to the urgent care  center yesterday.  They felt that the patient needed admission for IV  antibiotics, subsequently consulted the Hospitalist Service.  The  patient was admitted yesterday with right forearm abscess, status post  I&D and proximal ascending cellulitis.  The patient was admitted to a  monitor bed and placed on IV antibiotics including ceftriaxone and  Ancef.  His cultures showed sensitivity.  We have been consulted for the  possibility of further incision and drainage and debridement of this  forearm abscess.  The patient denies any significant fevers.  He has  felt well otherwise and feels as though there actually has been some  improvement in the arm since his admission yesterday.   PAST MEDICAL HISTORY:  Significant for coronary artery disease with  history of MI, status post 2 coronary stents in 2006.  He remains on  Plavix for that.  He also has a history of hypertension and  hyperlipidemia for which he takes  medications.   PAST SURGICAL HISTORY:  Pertinent for right knee arthroscopy and  bilateral inguinal hernia surgery.   SOCIAL HISTORY:  The patient lives with his wife and has 2 children.  He  is self-employed.  He denies any history of alcohol, tobacco, or illicit  drug use.   FAMILY HISTORY:  Not pertinent to this current case.   REVIEW OF SYSTEMS:  The patient denies any fevers, chills, sweats, or  weight changes.  Denies any chest pain, shortness of breath.  Denies any  nausea, vomiting, abdominal pain, changes in bowel habits.  Denies  myalgias, headaches, or fatigue.   PHYSICAL EXAMINATION:  VITAL SIGNS:  The patient's vitals are stable.  T-  max is 98.2, pulse 83, respirations 20, blood pressure 131/74.  GENERAL:  A 65 year old male in no acute distress.  ENT:  Unremarkable.  NECK:  No cervical or supraclavicular lymphadenopathy.  Neck is supple.  HEART:  Regular rate and rhythm.  No murmurs, gallops, or rubs.  LUNGS:  Clear to auscultation.  No wheezes, rhonchi, or rales.  ABDOMEN:  Soft, nontender.  No mass effect or organomegaly.  EXTREMITIES:  The right upper extremity demonstrates 1+ edema with  confluent erythema extending from proximal forearm up to the proximal  upper arm.  There is no extension of erythema into the axilla, however,  there is minimally present but nontender lymphadenopathy.  On the dorsal  aspect of the proximal forearm approximately 6 cm away from the  olecranon process, is a punctate opening at the site of the previous  incision and drainage.  On compression and manipulation of this area, I  was able to express purulent material, however, no deep abscess pocket  was identified.  The area is minimally tender to palpation.  The patient  demonstrates full range of motion both active and passive without  discomfort.  At the elbow, he has no tenderness over the olecranon  bursa.  The patient has a palpable radial and ulnar pulse and normal  capillary  refill.  Remainder of the extremity exam is within normal  limits.  NEUROLOGIC:  The patient is grossly intact without deficits.   DIAGNOSTIC STUDIES:  The patient's white blood cell count was 12.4, upon  admission it is currently 11.4.  Remainder of labs are within normal  limits.   ASSESSMENT:  1. Right forearm and upper arm cellulitis with underlying forearm      abscess, status post incision and drainage.  2. Coronary artery disease.  3. Hypertension.  4. Hyperlipidemia.   PLAN:  Agreed with the patient's plan for IV antibiotics at present with  local heat to the area and dry dressing to absorb any drainage at  present time.  I feel at the present time since the patient is noting  clinical improvement, we will continue to monitor this without further  need for surgical intervention.  We will follow him closely as certainly  progressing for worsening abscess and cellulitis as a possibility and  the patient may need further surgical intervention.      Brayton El, PA-C      Cherylynn Ridges, M.D.  Electronically Signed    KB/MEDQ  D:  12/18/2008  T:  12/18/2008  Job:  [id number]

## 2010-10-07 NOTE — H&P (Signed)
NAME:  Chase Combs, Chase Combs NO.:  [id number]   MEDICAL RECORD NO.:  [id number]          PATIENT TYPE:  INP   LOCATION:  5007                         FACILITY:  [facility]   PHYSICIAN:  Paula Compton, MD        DATE OF BIRTH:  1945-09-26   DATE OF ADMISSION:  12/17/2008  DATE OF DISCHARGE:                              HISTORY & PHYSICAL   PRIMARY CARE PHYSICIAN:  [place].   CHIEF COMPLAINT:  Cellulitis.   HISTORY OF PRESENT ILLNESS:  This is a 65 year old male with a 1-week  history of progressive swelling, redness, and purulent discharge in the  right arm.  Patient states that his symptoms began 1 week ago with a  bump on the distal elbow.  Patient states that over the next 3 to 4  days, the area became progressively more red and swollen.  Patient  states that on December 14, 2008, the arm was extremely tight with  difficulty moving the fingers and flexing it as well as extending the  right arm.  The patient then went to [facility], where he was  given ceftriaxone, Bactrim, and doxycycline over a 3-day period with  some relief in the redness and swelling.  Patient noticed seepage of  purulent fluid from the bumps from which the patient subsequently  squeezed them up to release approximately an adult palm-sized amount of  purulent fluid.  Patient was seen earlier today at [facility]  and was sent to [facility] for inpatient management of  the infection.  Of note, cultures were obtained at [facility],  indicating Staphylococcus aureus, penicillin-resistant, cefazolin-  sensitive, Bactrim-sensitive, tetracycline-sensitive.  Patient denies  any rashes, fevers, worsening headache, chest pain, abdominal or joint  pain throughout the progression of the infection.   PAST MEDICAL HISTORY:  1. MI, status post 2 stents.  2. Coronary artery disease.  3. Hypertension.   PAST SURGICAL HISTORY:  1. Right knee arthroscopy.  2. Bilateral groin  hernia surgeries.   ALLERGIES:  None.   MEDICATIONS:  1. Crestor 10 mg p.o. daily.  2. Plavix 75 mg daily.  3. Diovan 320 mg p.o. daily.  4. Amlodipine 10 mg p.o. daily.  5. Aspirin 325 mg p.o. daily.   SOCIAL HISTORY:  Patient lives with his wife as well as his son and his  daughter.  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  Patient denies any tobacco,  alcohol, or illegal drug use.   FAMILY HISTORY:  Mother died of lung cancer.  Father had COPD as well as  emphysema with a positive smoking history.  Brother had COPD as well as  emphysema with a positive smoking history.  Sister with no significant  past medical history.   REVIEW OF SYSTEMS:  Negative except as noted above in the HPI.   PHYSICAL EXAMINATION:  Temperature 97.6, pulse 95, respirations 20,  blood pressure 130/77.  Patient is satting at greater than 96% on room  air.  GENERAL:  Alert and oriented x3 in no acute distress.  HEENT:  Normocephalic and atraumatic.  Extraocular movements are intact.  NECK:  Supple without lymphadenopathy.  CARDIOVASCULAR:  Regular rate and rhythm without murmurs, rubs or  gallops.  LUNGS:  Clear to auscultation  bilaterally without rales, rhonchi or  wheezes.  ABDOMEN:  Soft, nontender, moderately distended.  EXTREMITIES:  Positive redness extending from the right mid forearm to  the level of the biceps in the right arm in a diffuse pattern, swelling  is moderate in the left arm.  Some swelling is moderate in the right arm  versus comparison to the left arm.  Extremities are otherwise normal.  NEURO:  Cranial nerves II-XII are grossly intact.  Overall, neurological  exam is grossly normal without motor or sensory deficits noted in the  right arm.  MUSCULOSKELETAL:  Normal strength throughout.   LABS/STUDIES:  CBC, BMET, as well as blood cultures x2 are  pending upon  direct admission.   ASSESSMENT/PLAN:  This is a 65 year old with upper extremity abscess as  well as cellulitis.  1. Infection:  This is a Staphylococcus aureus cellulitis and abscess      by [facility] cultures.  This was I and D'd at [facility] with      drainage of purulent yellow material.  Patient states that the      symptomatology was improving prior to admission.  The patient was      on ceftriaxone in the outpatient setting.  Sensitivities from      [facility] indicates sensitivities to ceftriaxone as well      as Cefazolin.  We will continue ceftriaxone IV, and when the      redness and swelling improve, we will transition the patient to      p.o. oral antibiotics.  Per [place] culture, Staphylococcus aureus      is sensitive to doxycycline and Bactrim.  We will also check a CBC      for leukocytosis as well as blood cultures x2 for systemic      infection.  2. Hypertension:  This is stable.  We will continue the patient on his      current home medical regimen.  We will also check a BMET for any      type of abnormalities noted.  3. Coronary artery disease:  Stable.  We will continue the patient on      Crestor, Plavix, and aspirin.  4. Prophylaxis:  There is no current indication for gastrointestinal      prophylaxis.  We will place the patient on      DVT prophylaxis with heparin t.i.d. prophylactic dose per pharmacy.  5. Disposition:  Patient is likely to have a 24 to 48 hour      hospitalization with IV antibiotics.  We will transition the      patient to p.o. antibiotic coverage when erythema improves.      Doree Albee, MD  Electronically Signed      Paula Compton, MD  Electronically Signed    SN/MEDQ  D:  12/17/2008  T:  12/18/2008  Job:  [id number]

## 2010-10-07 NOTE — Discharge Summary (Signed)
NAME:  Chase Combs, EADS NO.:  [id number]   MEDICAL RECORD NO.:  [id number]          PATIENT TYPE:  INP   LOCATION:  5702                         FACILITY:  [facility]   PHYSICIAN:  Thornton Park. Daphine Deutscher, MD  DATE OF BIRTH:  01/20/1946   DATE OF ADMISSION:  12/09/2006  DATE OF DISCHARGE:  12/15/2006                               DISCHARGE SUMMARY   DISCHARGE DIAGNOSES:  1. Fall.  2. Sacral fracture.  3. T11 fracture.  4. Obesity.  5. Coronary artery disease.  6. Hypertension.  7. Rib fracture.   CONSULTANTS:  Dr. Newell Coral for neurosurgery and Dr. Carola Frost for orthopedic  surgery.   PROCEDURES:  NONE.   HISTORY OF PRESENT ILLNESS:  This is a 65 year old white male who fell  about 14 feet.  He came into the ER by private vehicle and was a non-  trauma code.  He complained of back pain and pelvic pain.  There was no  head trauma associated with this fall.  Workup demonstrated the pelvic  fracture and the T11 compression fracture.  Neurosurgery and orthopedic  surgery were consulted, and the patient was admitted for pain control  and specialist treatment.   HOSPITAL COURSE:  The patient was initially on bedrest for 4 days per  neurosurgery because of his T11 fracture, then he was allowed to  mobilize.  He initially was weightbearing as tolerated.  However, there  is some question on his x-ray when he was upright, and so he was made  non-weightbearing on his right lower extremity, but was still able to  mobilize well without restriction and was at home in good condition in  the care of his wife.   DISCHARGE MEDICATIONS:  1. Norco 10/325, take 1 to 2 p.o. q.4 h. p.r.n. pain #60 with one      refill.  2. Robaxin 5 mg tablets, take 1 to 2 p.o. q.6 h p.r.n. muscle spasm      #100 with no refill.  In addition, he is to resume all of his home medications which include  Plavix, Crestor, Diovan, amlodipine, aspirin and a multivitamin as  directed.   FOLLOW UP:  The patient  will follow-up with Dr. Carola Frost and with Dr.  Newell Coral and will call their offices for appointments.  If he has any  questions or concerns other than that, he may call trauma.  Otherwise,  followup with [place] will be on an as-needed basis.      Earney Hamburg, P.A.      Thornton Park Daphine Deutscher, MD  Electronically Signed    MJ/MEDQ  D:  12/15/2006  T:  12/15/2006  Job:  [id number]   cc:   Hewitt Shorts, M.D.  Doralee Albino. Carola Frost, M.D.

## 2010-10-07 NOTE — Discharge Summary (Signed)
NAME:  Chase Combs, Chase Combs NO.:  [id number]   MEDICAL RECORD NO.:  [id number]          PATIENT TYPE:  INP   LOCATION:  5007                         FACILITY:  [facility]   PHYSICIAN:  Paula Compton, MD        DATE OF BIRTH:  1945-08-16   DATE OF ADMISSION:  12/17/2008  DATE OF DISCHARGE:  12/19/2008                               DISCHARGE SUMMARY   PRIMARY CARE PHYSICIAN:  Dr. Hal Hope at [facility] urgent care practice.   DISCHARGE DIAGNOSES:  1. Abscess.  2. Cellulitis.   DISCHARGE MEDICATIONS:  1. Crestor 10 mg p.o. daily.  2. Plavix 75 mg p.o. daily.  3. Norvasc 10 mg p.o. daily.  4. Aspirin 325 mg p.o. daily.  5. Diovan 320 mg p.o. daily.  6. Doxycycline 100 mg p.o. b.i.d. x7 days.   CONSULTATIONS:  General surgery.   PROCEDURES:  None.   LABS:  CBC - white count 12.4, hemoglobin 12.3, hematocrit 36.5,  platelet count 223.  BMET - sodium 136, potassium 3.4, chloride 108, CO2  22, BUN 18, creatinine 0.82, glucose 166.  Labs at discharge, CBC -  white count 6.5, hemoglobin 13.1, hematocrit 38.4, platelet count of  273.  Blood cultures negative x2.   BRIEF HOSPITAL COURSE:  This is a 65 year old male with a 1 week history  of progressive swelling, redness and purulent discharge in the right arm  which was found to be cellulitis and treated at [facility] urgent care where  outpatient treatment failed and was admitted for inpatient management of  cellulitis and abscess of the right arm.  1. Infection/abscess/cellulitis.  This is a Staphylococcus aureus      cellulitis that was diagnosed by culture from [facility] healthcare.      Sensitivities from cultures from [facility] urgent care showed      sensitivity to ceftriaxone as well as cefazolin.  The patient was      treated with cefazolin 1 g t.i.d. x2 days in inpatient treatment      with subsequent resolution of redness, swelling as well as      discharge.  General surgery was also contacted given the nature of      the  presentation being that there was excessive purulent discharge      with I and D at [facility] urgent care before admission.  Surgery      evaluated the patient and saw no need for surgical intervention at      the time with subsequent followup.  At the time of discharge the      patient's cellulitic wound showed marked decrease in erythema,      swelling as well as fluid drainage. The patient states that the      area of inflammation felt markedly better.  There was also      resolution of the white count being that the patient presented with      a white count of 12.4 on admission with a white count upon      discharge of 6.5.  2. Hypertension.  This is stable.  The patient was  continued on his      home medication regimen.  BMETs were also checked without any      abnormalities found during hospitalization.  3. Coronary artery disease.  This is clinically stable.  The patient      was continued on his home Crestor, Plavix and aspirin regimen      during hospitalization.   DISCHARGE INSTRUCTIONS:  The patient will be placed on p.o. doxycycline  100 mg p.o. b.i.d. x7 days given that the culture showed sensitivities  to doxycycline as well as the patient being prescribed doxycycline from  [place] urgent care.  This will need to be continued at home to complete  the antibiotic course.   FOLLOWUP APPOINTMENT:  The patient will return to Dr. Hal Hope at urgent  care [place] on July 30 at 2:15 with a number that can be reached at [phone]  [phone].   DISCHARGE CONDITION:  The patient was discharged to home in stable  medical condition.      Doree Albee, MD  Electronically Signed      Paula Compton, MD  Electronically Signed    SN/MEDQ  D:  12/19/2008  T:  12/19/2008  Job:  [id number]   cc:   Paula Compton, MD

## 2010-10-07 NOTE — Consult Note (Signed)
NAME:  Chase Combs, Chase Combs NO.:  [id number]   MEDICAL RECORD NO.:  [id number]          PATIENT TYPE:  INP   LOCATION:  5702                         FACILITY:  [facility]   PHYSICIAN:  Hewitt Shorts, M.D.DATE OF BIRTH:  Apr 14, 1946   DATE OF CONSULTATION:  12/08/2006  DATE OF DISCHARGE:                                 CONSULTATION   HISTORY OF PRESENT ILLNESS:  The patient is a 65 year old white male who  was working replacing a portion of a roof.  He was standing on a freezer  and stepped off onto what turned out to be a ceiling tile and fell  through the ceiling.  He was brought to [facility]  emergency room apparently by private vehicle and was evaluated by the  emergency room physician, Dr. Benjiman Core.  Extensive trauma workup  was performed and he was thought to have sustained a multiple trauma.  The patient is being admitted to the trauma surgical service by Dr. Marcille Blanco.  He has been seen in orthopedic trauma surgery consultation by  Dr. Carola Frost for a right transforaminal sacral fracture.  He was also found  to have a T11 compression fracture and neurosurgical consultation was  requested.   Symptomatically, the patient does complain of lower thoracic back pain.  He denies any radicular pain, numbness, paresthesias, or weakness.   PAST MEDICAL HISTORY:  Notable for a history of hypertension, as well as  atherosclerotic coronary vascular disease and myocardial infarction.  He  is followed by Dr. Chanda Busing who treats him with amlodipine,  aspirin, Crestor, Diovan, and Plavix.  His stenting was over 2 years  ago.  He denies any history of cancer, stroke, diabetes, peptic ulcer  disease, or lung disease.   PREVIOUS SURGERY:  Includes bilateral inguinal herniorrhaphies, left  knee surgery, and a coccygectomy.   ALLERGIES:  HE HAS AN ALLERGY TO CODEINE.   CURRENT MEDICATIONS:  Do include amlodipine, aspirin, Crestor, Diovan,  and  Plavix.   FAMILY HISTORY:  Parents both died of cancer.   SOCIAL HISTORY:  The patient works as a Product/process development scientist.  He does not  smoke.   REVIEW OF SYSTEMS:  Notable for those symptoms described in the history  of present illness and past medical history.  He does complain of some  lower thoracic discomfort.  Other review of systems unremarkable from a  neurosurgical perspective.   PHYSICAL EXAMINATION:  GENERAL:  The patient is a well-developed, well-  nourished white male in no acute distress.  VITAL SIGNS:  Temperature 97.1, pulse 84, blood pressure 135/81.  MUSCULOSKELETAL EXAMINATION:  Shows moderate discomfort on mobility,  particularly rolling from side to side.  However, he does have  tenderness to palpation in the lower thoracic region.  NEUROLOGIC EXAMINATION:  Shows 5/5 strength of the lower extremities.  However, testing in the proximal lower extremities is difficult due to  pain, particularly with the proximal right lower extremity.  However,  the strength seems to be 5/5 in the iliopsoas, quadriceps, dorsiflexor,  extensor hallucis longus, and plantar flexor bilaterally.  Sensation  is  intact to pinprick of the lower extremities.  Reflexes are 1 in the  quadriceps, absent in the gastrocnemius, they are all  symmetrical.  Toes are downgoing bilaterally.  Gait and stance were not tested.   IMPRESSION:  Multiple trauma with T11 compression fracture as well as  other fractures and injuries being attended to by the trauma surgical  service and orthopedic service.   RECOMMENDATIONS:  From a neurosurgical perspective include admission to  the trauma surgical service.  Bed rest for 4 days with the head of bed  flat.  Then mobilize in a Jewett brace from [employer] [phone].  The  patient will need to follow up in my office in about 1 month with x-  rays.  I have spoken with the patient and his wife regarding calcium and  vitamin D.  I have stressed to them the importance of  wearing his brace  and that the patient and his wife will need to learn how to don and doff  the brace while in brace while in bed, and he needs to be wearing it  whenever he is up and about.  I explained to them the concerns about  progressive deformity with kyphosis and possible eventual need for  surgery, but at this time, there is no will for surgery.  I have  stressed to them that he needs to be started on Caltrate with vitamin D,  1 tablet b.i.d. and needs to be consuming milk products on a regular  basis.  They understand all recommendations.      Hewitt Shorts, M.D.  Electronically Signed     RWN/MEDQ  D:  12/08/2006  T:  12/09/2006  Job:  [id number]

## 2010-10-10 NOTE — Op Note (Signed)
[facility]  Patient:    Chase Combs, Chase Combs                       MRN: [id number] Proc. Date: 12/19/99 Adm. Date:  [id number] Disc. Date: [id number] Attending:  Montez Morita, Philips John                           Operative Report  PREOPERATIVE DIAGNOSIS:  Torn medial meniscus.  POSTOPERATIVE DIAGNOSIS:  Torn medial meniscus.  PROCEDURE:  Partial medial meniscectomy.  DESCRIPTION OF PROCEDURE:  After suitable general anesthesia, the leg was prepped and draped routinely and an upper thigh tourniquet inflated to 400 mmHg. Arthroscopy was carried through anterolateral portal after lateral parapatellar inflow portal was created. Instruments were brought in through an anteromedial portal. Pertinent pathology confirmed the medial joint side where there was a large flap tear which was trimmed out with a rotary meniscotome nice smooth meniscal border on the left. Routine postoperative closure. 0.5% Marcaine in the knee at the end. He goes to recovery in good condition. Tourniquet up at 30 minutes. DD:  12/19/99 TD:  12/22/99 Job: [id number]

## 2010-10-10 NOTE — Cardiovascular Report (Signed)
NAME:  Chase Combs, REBSTOCK NO.:  [id number]   MEDICAL RECORD NO.:  [id number]          PATIENT TYPE:  OIB   LOCATION:  2899                         FACILITY:  [facility]   PHYSICIAN:  Madaline Savage, M.D.DATE OF BIRTH:  [birth year]   DATE OF PROCEDURE:  06/18/2004  DATE OF DISCHARGE:                              CARDIAC CATHETERIZATION   PROCEDURES PERFORMED:  1.  Left coronary arteriography in multiple views.  2.  Percutaneous coronary stenting of the proximal portion of the      intermediate ramus branch to the left main coronary artery.   COMPLICATIONS:  None.   ENTRY SITE:  Right femoral.   DYE USED:  Omnipaque.   CATHETERS USED:  6-French sheath, 6-French guide catheter which was a Cordis  6-French XB 3.5 without side holes.   GUIDE WIRES USED:  The Forte wire was used to size the vessel. The [employer]ahi  soft was used to deliver the stent.  ACT achieved during procedure 270  seconds.   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 Tresa Endo, on  May 27, 2004, showing a stenosis in the proximal intermediate ramus  branch of 70%.  Other vessels showed trivial luminal irregularities but no  significant lesions.  LV function was normal.  The patient had undergone a  stress Cardiolite test previously which showed lateral wall ischemia in a  patient with risk factors including treated but uncontrolled hypertension,  family history and, of course, his abnormal Cardiolite.  Today, we brought  the patient to the cath lab after giving him pretreatment with Plavix for  the last 2 weeks, 75 mg daily and proceeded with this intended stenting of  his intermediate ramus branch.   The nurses at [facility] randomized the patient into the CoStar II  trial, and we kept with the study protocol during the  procedure.   The patient underwent left coronary angiography in multiple views including  orthogonal angles which showed that there was a widely patent left main  coronary artery, a normal circumflex, a normal LAD, and there was a  proximally stenosed intermediate ramus branch, medium in size with a  bifurcating distal branch.  There was no ostial stenosis in this vessel.  No  further views were obtained since the patient had previously had right  coronary angiography within the last several weeks.   The guide catheter was situated.  A measurement was made with the Forte  wire, and then the [place] wire was used to direct the stent after the Forte  wire had developed some crimping in the tip of the wire.   The patient's ACT was 270.  A predilatation of the eccentric 80% stenosis in  the proximal ramus was dilated with a 2.5 x 12 mm Maverick balloon taken up  to 6 atmospheres of pressure, just enough to reduce the dimpling or dumb-  belling of the lesion.   I then proceeded with the  CoStar II study stent which was a 2.5 x 16 mm  stent placed strategically in the lesion and placed centrally over the area  of previous tight stenosis. There was some difficulty in achieving that  result, but continued attempts at near perfect positioning proved to be  worthwhile.  My first inflation was to 2 atmospheres for approximately 5-10  seconds, 4 atmospheres for 5-10 seconds, and then I saw a dumbbell appear in  the stent balloon contour at approximately 6 atmospheres.  I took it up to  12 atmospheres and left it there for 1 minute and then after deflation, we  confirmed that there had been an excellent result with a very mild step-up  into the stented portion of the vessel and then a step down into the outflow  portion of the stent.  A second balloon inflation was made, again to 12  atmospheres, and preservation of TIMI III flow was noted.  No complications  occurred including any hemodynamic rhythm  or bleeding complications.  The  patient did report mild chest discomfort during balloon inflations, but this  discomfort was gone by time of cath lab exit.   FINAL IMPRESSIONS:  This lesion of 80% was reduced to 0% residual with  preservation of TIMI III flow with a CoStar II study stent which was 2.5 mm  in diameter and 16 mm in length.   DIAGNOSIS:  1.  Coronary artery disease with positive Cardiolite.  2.  An 80% proximal intermediate ramus branch stenosis.  3.  Successful percutaneous stenting with reduction of 80% lesion to 0%      residual.      WHG/MEDQ  D:  06/18/2004  T:  06/18/2004  Job:  [id number]   cc:   [employer]   [facility]   Madaline Savage, M.D.  [address]  [place]  [place] [address]  Fax: [phone]

## 2010-10-10 NOTE — Op Note (Signed)
[place]  Patient:    Chase Combs, Chase Combs                       MRN: [id number] Proc. Date: 09/02/99 Adm. Date:  [id number] Disc. Date: [id number] Attending:  Arlis Porta CC:         Fayne Norrie, M.D.                           Operative Report  PREOPERATIVE DIAGNOSIS:  Recurrent incarcerated right inguinal hernia.  POSTOPERATIVE DIAGNOSIS:  Recurrent incarcerated right inguinal hernia.  PROCEDURE:  Repair of recurrent incarcerated right inguinal hernia with plug and mesh.  SURGEON:  Adolph Pollack, M.D.  ASSISTANT:  Michael Litter, PA-student.  ANESTHESIA:  General.  INDICATIONS:  This is a 65 year old man, who underwent a bilateral inguinal hernia repair approximately 7 or 8 years ago.  He said postoperatively, he had some severe vomiting and he noted pain immediately in the recovery room.  About one year ago, he noticed a bulge in the right inguinal area that was enlarging and then became acutely worse.  I saw him in the office and he was not in acute distress at that time, but I could not reduce the hernia and so he has been setup for repair. We discussed a laparoscopic procedure if I could reduce it after he had been anesthetized versus the open procedure.  TECHNIQUE:  He was placed supine Chase Combs the operating table and a general anesthetic was administered.  After he was anesthetized, I attempted to gently reduce the hernia and was unsuccessful.  So, I decided to proceed with the open repair.  I  marked the previous incision and the groin area was shaved and sterilely prepped and draped.  I reincised the right groin scar and carried this down to the level of what looked like the external oblique aponeurosis.  Subsequently made a small incision in this and noted a firm mass that was not reducible underneath it.  I  divided the external aponeurosis in direction of its fibers, being careful not injure underlying  cord structures or ilioinguinal nerve.  Once I had divided the external oblique aponeurosis, I exposed the internal oblique aponeurosis using sharp and blunt dissection.  I subsequently attempted to expose the shelving edge of inguinal ligament with some difficulty.  I then brought the hernia contents p into the incision and it looked like incarcerated preperitoneal fat.  I separated the cord structures away from this.  There was just a vein, artery and the vas deferens.  Once I opened up the sac and this appeared to be an indirect defect, as I traced it back towards the spermatic cord.  Once I had opened up the sac, I noticed this was incarcerated preperitoneal fat and I excised it.  I then closed the sac with a 2-0 silk suture.  Next, I brought a 3 x 6 inch piece of mesh into the field and anchored it 1 cm medial to the pubic tubercle.  I then anchored the inferior edge of the mesh to the shelving edge of the inguinal ligament with a running 2-0 Prolene suture. Before I had placed the mesh patch in, there was such a patulous defect in the internal ring, that I put a mesh plug in that area.  I cut two tails in the mesh and wrapped them around the spermatic cord.  I anchored the  superior aspect of the esh up to the internal oblique aponeurosis and muscle with interrupted 2-0 Vicryl sutures.  I then crossed the tails of the mesh creating a new internal ring and  anchored into the shelving edge of the inguinal ligament with a single 2-0 Prolene suture.  I subsequently closed the external oblique aponeurosis over the mesh with a running 2-0 Vicryl suture.  Scarpas fascia was approximated with a running 3-0 Vicryl suture.  The skin was closed with 4-0 Monocryl subcuticular stitch, followed by  Steri-Strips and sterile dressings.  At this time, the right testicle remained own in the scrotum.  He tolerated the procedure well without any apparent complications and was taken  to the recovery room in satisfactory condition. DD:  09/02/99 TD:  09/03/99 Job: [id number]

## 2011-03-09 LAB — URINE MICROSCOPIC-ADD ON

## 2011-03-09 LAB — SAMPLE TO BLOOD BANK

## 2011-03-09 LAB — URINALYSIS, ROUTINE W REFLEX MICROSCOPIC
Ketones, ur: 15 — AB
Nitrite: NEGATIVE
Specific Gravity, Urine: 1.027
pH: 6

## 2011-03-09 LAB — BASIC METABOLIC PANEL
CO2: 25
Calcium: 8.8
Creatinine, Ser: 1.18
GFR calc Af Amer: >60
Glucose, Bld: 130 — ABNORMAL HIGH

## 2011-03-09 LAB — I-STAT 8, (EC8 V) (CONVERTED LAB)
Chloride: 107
Glucose, Bld: 92
Hemoglobin: 16.7
Potassium: 4.5
Sodium: 139
TCO2: 27

## 2011-03-09 LAB — EXPECTORATED SPUTUM ASSESSMENT W GRAM STAIN, RFLX TO RESP C

## 2011-03-09 LAB — CBC
MCHC: 34.7
RBC: 4.17 — ABNORMAL LOW
RDW: 12.7

## 2011-03-09 LAB — CULTURE, RESPIRATORY W GRAM STAIN

## 2012-03-15 HISTORY — PX: NM MYOVIEW LTD: HXRAD82

## 2012-06-01 ENCOUNTER — Ambulatory Visit (INDEPENDENT_AMBULATORY_CARE_PROVIDER_SITE_OTHER): Payer: BC Managed Care – PPO | Admitting: Family Medicine

## 2012-06-01 VITALS — BP 135/85 | HR 70 | Temp 97.4°F | Resp 16 | Ht 71.0 in | Wt 207.0 lb

## 2012-06-01 DIAGNOSIS — R972 Elevated prostate specific antigen [PSA]: Secondary | ICD-10-CM

## 2012-06-01 DIAGNOSIS — I213 ST elevation (STEMI) myocardial infarction of unspecified site: Secondary | ICD-10-CM

## 2012-06-01 DIAGNOSIS — I2510 Atherosclerotic heart disease of native coronary artery without angina pectoris: Secondary | ICD-10-CM

## 2012-06-01 DIAGNOSIS — Z0000 Encounter for general adult medical examination without abnormal findings: Secondary | ICD-10-CM

## 2012-06-01 LAB — POCT CBC
Granulocyte percent: 59.6 %G (ref 37–80)
MID (cbc): 0.6 (ref 0–0.9)
MPV: 9.4 fL (ref 0–99.8)
POC MID %: 8.3 %M (ref 0–12)
Platelet Count, POC: 395 10*3/uL (ref 142–424)
RBC: 5.13 M/uL (ref 4.69–6.13)

## 2012-06-01 LAB — PSA: PSA: 4.65 ng/mL — ABNORMAL HIGH (ref <=4.00)

## 2012-06-01 LAB — COMPREHENSIVE METABOLIC PANEL
AST: 25 U/L (ref 0–37)
Albumin: 4.4 g/dL (ref 3.5–5.2)
Alkaline Phosphatase: 66 U/L (ref 39–117)
Potassium: 4.5 mEq/L (ref 3.5–5.3)
Sodium: 138 mEq/L (ref 135–145)
Total Protein: 7.1 g/dL (ref 6.0–8.3)

## 2012-06-01 LAB — LIPID PANEL: LDL Cholesterol: 80 mg/dL (ref 0–99)

## 2012-06-01 NOTE — Patient Instructions (Addendum)
I will be in touch with the rest of your labs.  Try to find the date of your last colonoscopy to be sure you are not due- generally repeated every 10 years if normal.    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

## 2012-06-01 NOTE — Progress Notes (Signed)
[facility] [address], [place] [address] [phone]  Date:  06/01/2012   Name:  Chase Combs   DOB:  1945/07/22   MRN:  [id number]  PCP:  No primary provider on file.    Chief Complaint: Annual Exam   History of Present Illness:  Chase Combs is a 67 y.o. 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  He has a history of CAD- he had 3 stents in 2007 and 2010.  He is a pt at [facility]- he had some CP a few months ago that turned out to be non- cardiac, he had a negative stress test recently. Reviews these notes and stress test report- EF is over 50%. He is no longer having any CP.  He feels sure that his last tetanus shot was within 10 years, as was his colonoscopy.  He declines a flu shot today  He is fasting today  There is no problem list on file for this patient.   Past Medical History  Diagnosis Date  . Allergy   . Arthritis   . CAD (coronary artery disease) 2010, 2007    3 stents.  [facility]    History reviewed. No pertinent past surgical history.  History  Substance Use Topics  . Smoking status: Never Smoker   . Smokeless tobacco: Not on file  . Alcohol Use: No    Family History  Problem Relation Age of Onset  . Cancer Mother   . Cancer Father     Allergies not on file  Medication list has been reviewed and updated.  Current Outpatient Prescriptions on File Prior to Visit  Medication Sig Dispense Refill  . amLODipine (NORVASC) 10 MG tablet Take 10 mg by mouth daily.      . metoprolol succinate (TOPROL-XL) 25 MG 24 hr tablet Take 25 mg by mouth daily.        Review of Systems:  As per HPI- otherwise negative.   Physical Examination: Filed Vitals:   06/01/12 1029  BP: 135/85  Pulse:   Temp:   Resp:    Filed Vitals:   06/01/12 1011  Height: 5\' 11"  (1.803 m)  Weight: 207 lb (93.895 kg)   Body mass index is 28.87 kg/(m^2). Ideal Body Weight: Weight in (lb) to have BMI  = 25: 178.9   GEN: WDWN, NAD, Non-toxic, A & O x 3 HEENT: Atraumatic, Normocephalic. Neck supple. No masses, No LAD. Bilateral TM wnl, oropharynx normal.  PEERL,EOMI.   Ears and Nose: No external deformity. CV: RRR, No M/G/R. No JVD. No thrill. No extra heart sounds. PULM: CTA B, no wheezes, crackles, rhonchi. No retractions. No resp. distress. No accessory muscle use. ABD: S, NT, ND, +BS. No rebound. No HSM. EXTR: No c/c/e NEURO Normal gait.  PSYCH: Normally interactive. Conversant. Not depressed or anxious appearing.  Calm demeanor.  GU: no masses or inguinal hernia.   Rectal: normal, no prostate enlargement or masses noted  Results for orders placed in visit on 06/01/12  POCT CBC      Component Value Range   WBC 7.4  4.6 - 10.2 K/uL   Lymph, poc 2.4  0.6 - 3.4   POC LYMPH PERCENT 32.1  10 - 50 %L   MID (cbc) 0.6  0 - 0.9   POC MID % 8.3  0 - 12 %M   POC Granulocyte 4.4  2 - 6.9  Granulocyte percent 59.6  37 - 80 %G   RBC 5.13  4.69 - 6.13 M/uL   Hemoglobin 15.3  14.1 - 18.1 g/dL   HCT, POC 16.1  09.6 - 53.7 %   MCV 95.5  80 - 97 fL   MCH, POC 29.8  27 - 31.2 pg   MCHC 31.2 (*) 31.8 - 35.4 g/dL   RDW, POC 04.5     Platelet Count, POC 395  142 - 424 K/uL   MPV 9.4  0 - 99.8 fL  IFOBT (OCCULT BLOOD)      Component Value Range   IFOBT Negative      Assessment and Plan: 1. Physical exam, annual  POCT CBC, IFOBT POC (occult bld, rslt in office), Comprehensive metabolic panel, Lipid panel, PSA  2. CAD (coronary artery disease)    3. 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   Paper rx or zostavax to have at his convenience.    Will plan further follow- up pending labs.   Abbe Amsterdam, MD

## 2012-06-02 ENCOUNTER — Encounter: Payer: Self-pay | Admitting: Family Medicine

## 2012-06-02 NOTE — Addendum Note (Signed)
Addended by: Abbe Amsterdam C on: 06/02/2012 08:37 AM   Modules accepted: Orders

## 2012-06-03 NOTE — Progress Notes (Signed)
Reviewed paper chart- no old PSA values found

## 2012-10-13 ENCOUNTER — Other Ambulatory Visit: Payer: Self-pay | Admitting: Cardiology

## 2012-10-13 NOTE — Telephone Encounter (Signed)
Sent refill

## 2012-10-18 ENCOUNTER — Other Ambulatory Visit: Payer: Self-pay | Admitting: Cardiology

## 2012-10-20 NOTE — Telephone Encounter (Signed)
Refills sent to pharmacy. 

## 2013-02-17 ENCOUNTER — Other Ambulatory Visit: Payer: Self-pay | Admitting: Cardiology

## 2013-02-17 NOTE — Telephone Encounter (Signed)
Rx was sent to pharmacy electronically. 

## 2013-02-24 ENCOUNTER — Encounter: Payer: Self-pay | Admitting: Cardiology

## 2013-02-24 DIAGNOSIS — I10 Essential (primary) hypertension: Secondary | ICD-10-CM | POA: Insufficient documentation

## 2013-03-03 ENCOUNTER — Ambulatory Visit (INDEPENDENT_AMBULATORY_CARE_PROVIDER_SITE_OTHER): Payer: Medicare Other | Admitting: Cardiology

## 2013-03-03 ENCOUNTER — Encounter: Payer: Self-pay | Admitting: Cardiology

## 2013-03-03 VITALS — BP 140/88 | HR 57 | Ht 72.0 in | Wt 204.9 lb

## 2013-03-03 DIAGNOSIS — E782 Mixed hyperlipidemia: Secondary | ICD-10-CM

## 2013-03-03 DIAGNOSIS — I2510 Atherosclerotic heart disease of native coronary artery without angina pectoris: Secondary | ICD-10-CM

## 2013-03-03 DIAGNOSIS — Z79899 Other long term (current) drug therapy: Secondary | ICD-10-CM

## 2013-03-03 DIAGNOSIS — Z9861 Coronary angioplasty status: Secondary | ICD-10-CM

## 2013-03-03 DIAGNOSIS — I2119 ST elevation (STEMI) myocardial infarction involving other coronary artery of inferior wall: Secondary | ICD-10-CM

## 2013-03-03 DIAGNOSIS — I10 Essential (primary) hypertension: Secondary | ICD-10-CM

## 2013-03-03 DIAGNOSIS — E785 Hyperlipidemia, unspecified: Secondary | ICD-10-CM | POA: Insufficient documentation

## 2013-03-03 DIAGNOSIS — G4700 Insomnia, unspecified: Secondary | ICD-10-CM | POA: Insufficient documentation

## 2013-03-03 DIAGNOSIS — E1169 Type 2 diabetes mellitus with other specified complication: Secondary | ICD-10-CM | POA: Insufficient documentation

## 2013-03-03 MED ORDER — ROSUVASTATIN CALCIUM 20 MG PO TABS: ORAL_TABLET | ORAL | Status: DC

## 2013-03-03 NOTE — Progress Notes (Signed)
PATIENT: Chase Combs MRN: [id number]  DOB: 03/24/1946   DOV:03/05/2013 PCP: No PCP Per Patient  Clinic Note: Chief Complaint  Patient presents with  . Annual Exam    No chest pain, SOB, edema, dizziness or claudication symptoms.  He does complain of pain in the bottom of his feet after sitting for a while over the past 1 1/2 months.   HPI: Chase Combs is a 67 y.o. male with a PMH below who presents today for one-year followup. He is a very pleasant gentleman who I last saw in November of 2013. He has stress test performed which is negative for ischemia. Certainly doing fairly well with no real significant chest discomfort symptoms. No chest pain with rest or exertion. No shortness of breath with exertion. He is very active with his poor cardiac company. He denies any heart failure symptoms of PND, orthopnea or edema. No lightheadedness no dizziness no weakness or syncope/near syncope no TIA or amaurosis fugax symptoms.  The remainder of Cardiovascular ROS: no chest pain or dyspnea on exertion negative for - edema, irregular heartbeat, loss of consciousness, murmur, orthopnea, palpitations, paroxysmal nocturnal dyspnea, rapid heart rate or shortness of breath: he does occasionally get short of breath when lying down -- states that he was told he has OSA & was supposed to be on CPAP. Additional cardiac review of systems: Lightheadedness - no, dizziness - no, syncope/near-syncope - no; TIA/amaurosis fugax - no Melena - no, hematochezia no; hematuria - no; nosebleeds - no; claudication - no  Past Medical History  Diagnosis Date  . CAD S/P percutaneous coronary angioplasty 06/18/2004    CoStar Study Stent 2.5 mm x 16 mm - prox RI  . S/p ST elevation myocardial infarction (STEMI) of inferior wall 02/16/2009    Occluded RCA --> PCI after thrombectomy 2.5 mm x 20 mm vision BMs as (4.0 mm)  . History of nuclear stress test 03/16/2012    [place]: 7:20, 8 METS;; Mild to moderate scar with HK in  basal-mid inferior and basal inferolateral wall.;  No ischemia; EF 51%  . Hypertension   . Hyperlipidemia   . Multiple allergies   . Arthritis    Prior Cardiac Evaluation and Past Surgical History: Past Surgical History  Procedure Laterality Date  . Coronary angioplasty with stent placement  January 2006    PCI of the proximal Ramus Intermedius -- CoStar study stent 2.5 mm x 16 mm  . Coronary angioplasty with stent placement  September 2010    Inferior STEMI: Proximal RCA 3.5 mm 23 mm vision BMS (4.0 mm)    Allergies  Allergen Reactions  . Codeine Nausea And Vomiting    Current Outpatient Prescriptions  Medication Sig Dispense Refill  . amLODipine (NORVASC) 10 MG tablet Take 1 tablet (10 mg total) by mouth daily.  30 tablet  2  . aspirin 325 MG tablet Take 325 mg by mouth daily.      . clopidogrel (PLAVIX) 75 MG tablet Take 75 mg by mouth daily.      . metoprolol succinate (TOPROL-XL) 25 MG 24 hr tablet Take 25 mg by mouth daily.      . Multiple Vitamin (MULTIVITAMIN) tablet Take 1 tablet by mouth daily.      . niacin (NIASPAN) 500 MG CR tablet Take 500 mg by mouth at bedtime.      . rosuvastatin (CRESTOR) 20 MG tablet take 1 tablet by mouth once daily  90 tablet  3  . valsartan (DIOVAN) 320  MG tablet Take 320 mg by mouth daily.       No current facility-administered medications for this visit.   Has not been taking Crestor - Rx ran out & not refilled.  History   Social History Narrative   He is married, father of 3, grandfather of 5. Never smoked. Does not drink.    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    He does lots of lifting, pulling with ropes, using upper extremities.     ROS: A comprehensive Review of Systems - Negative except GERD, bilateral feet & hip pain -- ever since his fractured pelvis. Exercise is limited by foot pain with ambulation.  PHYSICAL EXAM BP 140/88   Pulse 57  Ht 6' (1.829 m)  Wt 204 lb 14.4 oz (92.942 kg)  BMI 27.78 kg/m2 General appearance: alert, cooperative, appears stated age, no distress and borderline obese Neck: no adenopathy, no carotid bruit and no JVD Lungs: clear to auscultation bilaterally, normal percussion bilaterally and non-labored Heart: regular rate and rhythm, S1, S2 normal, no murmur, click, rub or gallo; non-displaced PMI Abdomen: soft, non-tender; bowel sounds normal; no masses,  no organomegaly; mild HJR; protuberant abdomen with truncal obesity Extremities: extremities normal, atraumatic, no cyanosis, and no edema Pulses: 2+ and symmetric;  Neurologic: Mental status: Alert, oriented, thought content appropriate Cranial nerves: normal (II-XII grossly intact) HEENT: [place]/AT, EOMI, MMM, anicteric sclera  [id number] today: Yes Rate:57 , Rhythm: S Bradycardia; LAD - no significnat change (just no PACs)  Recent Labs: None since January (Reviewed on Epic)  ASSESSMENT / PLAN: We need to keep an eye on your cholesterol levels & BP.   Please check your BP every now & then -- 2-3 x per week -- we are looking for readings > 140/90 mmHg to see if we may need to increase your BP medications.  You were supposed to be on Crestor - the prescription may not have been properly filled.  We will restart @ 20 mg daily.    Check Fasting Cholesterol Levels in ~56months (January) to see how the numbers look   CAD S/P percutaneous coronary angioplasty No active angina or heart failure symptoms.negative Myoview 1 year ago.  Plan: Continue DAPT (dual antiplatelet therapy) with aspirin plus Plavix as he is not having any significant symptoms. The Plavix can be stopped for any procedures if need be. Continue beta blocker, ARB and statin. Also on calcium channel blocker for blood pressure and it angina effect.  S/p ST elevation myocardial infarction (STEMI) of inferior wall (September 2010) No untoward symptoms. He did have mild  reduction in EF of 45-50 %, but no heart failure symptoms. On stable regimen as noted above.   Hypertension Borderline pressures today. I did recheck his blood pressure is lower at 135/80 mmHg, but still you can see running at the higher end of his blood pressure range. He does not routinely check blood pressures at home, I have asked that he check it couple times a week off and on the morning and evening this to see how his blood pressure control actually is. He is on pretty good doses of this current medications, and we went at an additional medication for better control if needed.   He is on low-dose Toprol, with a low heart rate, we can't increase that much.  Dyslipidemia, goal LDL below 70 Currently not taking his statin, do to  some confusion as his prescription. Last lab check was quite good, and should be fine for annual followup.  Plan: Recheck fasting lipid panel and chemistry panel in January. Refill prescription for Crestor 20 mg daily  Insomnia I think the main reason for his fatigue and feeling tired is because he is not getting enough sleep at night. I suggested he try something like a simple over-the-counter Tylenol PM or Benadryl at night onthat evening when he does not have to go to work the next day, to avoidthe possibility of feeling overly groggy. I think heis able to get a good night sleep for at least a couple nights out of the week, he should feel much better as far as energy level goes.    Orders Placed This Encounter  Procedures  . Lipid panel    Order Specific Question:  Has the patient fasted?    Answer:  Yes  . Comprehensive metabolic panel    Order Specific Question:  Has the patient fasted?    Answer:  Yes  . EKG 12-Lead  . EKG 12-Lead    This order was created through External Result Entry   Meds ordered this encounter  Medications  . valsartan (DIOVAN) 320 MG tablet    Sig: Take 320 mg by mouth daily.  . niacin (NIASPAN) 500 MG CR tablet    Sig: Take  500 mg by mouth at bedtime.  . rosuvastatin (CRESTOR) 20 MG tablet    Sig: take 1 tablet by mouth once daily    Dispense:  90 tablet    Refill:  3   Followup: 1 yr follow-up  DAVID W. Herbie Baltimore, M.D., M.S. [facility] [address] [place], [place]  [address]  [phone] Pager # [phone]

## 2013-03-03 NOTE — Patient Instructions (Signed)
You seem to be doing well.  We need to keep an eye on your cholesterol levels & BP.   Please check your BP every now & then -- 2-3 x per week -- we are looking for readings > 140/90 mmHg to see if we may need to increase your BP medications.  You were supposed to be on Crestor - the prescription may not have been properly filled.  We will restart @ 20 mg daily.    Check Fasting Cholesterol Levels in ~73months (January) to see how the numbers look   Otherwise, I think you are fine for 1 yr follow-up.  Marykay Lex, MD  Your physician wants you to follow-up in: 1 yr. You will receive a reminder letter in the mail two months in advance. If you don't receive a letter, please call our office to schedule the follow-up appointment.

## 2013-03-05 NOTE — Assessment & Plan Note (Signed)
I think the main reason for his fatigue and feeling tired is because he is not getting enough sleep at night. I suggested he try something like a simple over-the-counter Tylenol PM or Benadryl at night onthat evening when he does not have to go to work the next day, to avoidthe possibility of feeling overly groggy. I think heis able to get a good night sleep for at least a couple nights out of the week, he should feel much better as far as energy level goes.

## 2013-03-05 NOTE — Assessment & Plan Note (Signed)
Borderline pressures today. I did recheck his blood pressure is lower at 135/80 mmHg, but still you can see running at the higher end of his blood pressure range. He does not routinely check blood pressures at home, I have asked that he check it couple times a week off and on the morning and evening this to see how his blood pressure control actually is. He is on pretty good doses of this current medications, and we went at an additional medication for better control if needed.   He is on low-dose Toprol, with a low heart rate, we can't increase that much.

## 2013-03-05 NOTE — Assessment & Plan Note (Signed)
Currently not taking his statin, do to some confusion as his prescription. Last lab check was quite good, and should be fine for annual followup.  Plan: Recheck fasting lipid panel and chemistry panel in January. Refill prescription for Crestor 20 mg daily

## 2013-03-05 NOTE — Assessment & Plan Note (Signed)
No untoward symptoms. He did have mild reduction in EF of 45-50 %, but no heart failure symptoms. On stable regimen as noted above.

## 2013-03-05 NOTE — Assessment & Plan Note (Addendum)
No active angina or heart failure symptoms.negative Myoview 1 year ago.  Plan: Continue DAPT (dual antiplatelet therapy) with aspirin plus Plavix as he is not having any significant symptoms. The Plavix can be stopped for any procedures if need be. Continue beta blocker, ARB and statin. Also on calcium channel blocker for blood pressure and it angina effect.

## 2013-05-03 ENCOUNTER — Other Ambulatory Visit: Payer: Self-pay | Admitting: Cardiology

## 2013-05-03 NOTE — Telephone Encounter (Signed)
Rx was sent to pharmacy electronically. 

## 2013-05-15 ENCOUNTER — Other Ambulatory Visit: Payer: Self-pay | Admitting: Cardiology

## 2013-05-15 NOTE — Telephone Encounter (Signed)
Rx was sent to pharmacy electronically. 

## 2013-05-29 ENCOUNTER — Telehealth: Payer: Self-pay | Admitting: Cardiology

## 2013-05-29 NOTE — Telephone Encounter (Signed)
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

## 2013-05-29 NOTE — Telephone Encounter (Signed)
Message forwarded to Dr. Sharlyn Bologna, RN.

## 2013-05-29 NOTE — Telephone Encounter (Signed)
Returned call and spoke w/ Jon Billings.  Informed Dr. Ellyn Hack will work on letter on Thursday.  Verbalized understanding.

## 2013-05-29 NOTE — Telephone Encounter (Signed)
Will work on it on my 1/2 day this week.  Leonie Man, MD

## 2013-06-01 ENCOUNTER — Encounter: Payer: Self-pay | Admitting: Cardiology

## 2013-06-01 ENCOUNTER — Telehealth: Payer: Self-pay | Admitting: Cardiology

## 2013-06-01 NOTE — Telephone Encounter (Signed)
Spoke to [employer] that Jon Billings is not at office at the present time.   Informed Joelene Millin at [facility] - the letter is available concerning Chase Combs (01-31-1946)  Per Lenise Herald via fax [phone] , she will place on [name] desk.  Letter faxed.

## 2013-06-01 NOTE — Telephone Encounter (Signed)
Spoke to wife and informed her that the letter has been faxed to [employer].  Wife verbalized understanding.

## 2013-06-01 NOTE — Telephone Encounter (Signed)
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 Would like you to call her when letter has been completed.

## 2013-06-01 NOTE — Telephone Encounter (Signed)
Letter has been completed.  Trixie Dredge has signed copy.  Leonie Man, MD

## 2013-06-16 ENCOUNTER — Other Ambulatory Visit: Payer: Self-pay | Admitting: *Deleted

## 2013-06-22 LAB — LIPID PANEL
Cholesterol: 118 mg/dL (ref 0–200)
HDL: 42 mg/dL (ref >39)
LDL Cholesterol: 62 mg/dL (ref 0–99)
Total CHOL/HDL Ratio: 2.8 Ratio
Triglycerides: 72 mg/dL (ref <150)
VLDL: 14 mg/dL (ref 0–40)

## 2013-06-22 LAB — COMPREHENSIVE METABOLIC PANEL
ALT: 26 U/L (ref 0–53)
AST: 22 U/L (ref 0–37)
Albumin: 4.2 g/dL (ref 3.5–5.2)
Alkaline Phosphatase: 62 U/L (ref 39–117)
BUN: 17 mg/dL (ref 6–23)
CO2: 28 mEq/L (ref 19–32)
Calcium: 9.4 mg/dL (ref 8.4–10.5)
Chloride: 104 mEq/L (ref 96–112)
Creat: 0.71 mg/dL (ref 0.50–1.35)
Glucose, Bld: 111 mg/dL — ABNORMAL HIGH (ref 70–99)
Potassium: 4.9 mEq/L (ref 3.5–5.3)
Sodium: 140 mEq/L (ref 135–145)
Total Bilirubin: 0.6 mg/dL (ref 0.2–1.2)
Total Protein: 7 g/dL (ref 6.0–8.3)

## 2013-06-23 ENCOUNTER — Other Ambulatory Visit: Payer: Self-pay

## 2013-06-23 MED ORDER — METOPROLOL TARTRATE 25 MG PO TABS: 25.0000 mg | ORAL_TABLET | Freq: Two times a day (BID) | ORAL | Status: DC

## 2013-06-26 ENCOUNTER — Telehealth: Payer: Self-pay | Admitting: *Deleted

## 2013-06-26 NOTE — Telephone Encounter (Signed)
Spoke to patient.CMP,LIPIDS Result given. RN informed patient to watch out for diet - carbs (rice  Bread, potatoes,sugars)  Follow up with ;Primary  Verbalized understanding.

## 2013-06-26 NOTE — Telephone Encounter (Signed)
Message copied by Raiford Simmonds on Mon Jun 26, 2013  9:08 AM ------      Message from: Leonie Man      Created: Fri Jun 23, 2013  7:27 PM       Excellent lipid control.  HDL looks better, now at goal. LDL it is now at goal. Chemistry panel is good with the exception of glucose being elevated.            Leonie Man, MD       ------

## 2013-08-19 ENCOUNTER — Other Ambulatory Visit: Payer: Self-pay | Admitting: Cardiology

## 2014-01-30 ENCOUNTER — Telehealth: Payer: Self-pay | Admitting: Cardiology

## 2014-01-30 NOTE — Telephone Encounter (Signed)
Please call about his Amlodipine Besylaye 10 mg. The one he is getting from [facility] is making him sick.

## 2014-01-31 NOTE — Telephone Encounter (Signed)
Wife called:  Pharmacy switched manufacturers for Amlodipine to [employer] about 3 weeks ago.  Since Mr. Gorka has had stomach upset, diarrhea and occas. Abdominal pain.  He stopped the Amlodipine 3 days ago and still has symptoms.  Suggested they contact his PCP - sounds like he has something else going on and to check his BP at home and restart Amlodipine ASAP.  Wife voiced understanding.

## 2014-03-03 ENCOUNTER — Other Ambulatory Visit: Payer: Self-pay | Admitting: Cardiology

## 2014-03-05 NOTE — Telephone Encounter (Signed)
Rx was sent to pharmacy electronically. 

## 2014-03-06 ENCOUNTER — Other Ambulatory Visit: Payer: Self-pay | Admitting: Cardiology

## 2014-03-14 ENCOUNTER — Other Ambulatory Visit: Payer: Self-pay | Admitting: Cardiology

## 2014-03-14 NOTE — Telephone Encounter (Signed)
E-SENT PHARMACY  

## 2014-04-02 ENCOUNTER — Other Ambulatory Visit: Payer: Self-pay | Admitting: Cardiology

## 2014-04-03 NOTE — Telephone Encounter (Signed)
Rx refill sent to patient pharmacy   

## 2014-05-03 ENCOUNTER — Other Ambulatory Visit: Payer: Self-pay | Admitting: Cardiology

## 2014-05-03 NOTE — Telephone Encounter (Signed)
Rx was sent to pharmacy electronically. OV 06/08/14

## 2014-05-15 ENCOUNTER — Other Ambulatory Visit: Payer: Self-pay | Admitting: Cardiology

## 2014-05-15 NOTE — Telephone Encounter (Signed)
Rx refill sent to patient pharmacy   

## 2014-05-20 ENCOUNTER — Other Ambulatory Visit: Payer: Self-pay | Admitting: Cardiology

## 2014-05-21 NOTE — Telephone Encounter (Signed)
Rx refill denied to patient pharmacy  Rx requested too soon

## 2014-05-28 ENCOUNTER — Other Ambulatory Visit: Payer: Self-pay | Admitting: Cardiology

## 2014-05-28 NOTE — Telephone Encounter (Signed)
Rx(s) sent to pharmacy electronically. OV 06/08/14

## 2014-05-29 ENCOUNTER — Other Ambulatory Visit: Payer: Self-pay | Admitting: Cardiology

## 2014-06-08 ENCOUNTER — Ambulatory Visit (INDEPENDENT_AMBULATORY_CARE_PROVIDER_SITE_OTHER): Payer: Medicare Other | Admitting: Cardiology

## 2014-06-08 VITALS — BP 160/82 | HR 56 | Ht 72.0 in | Wt 208.7 lb

## 2014-06-08 DIAGNOSIS — I2119 ST elevation (STEMI) myocardial infarction involving other coronary artery of inferior wall: Secondary | ICD-10-CM

## 2014-06-08 DIAGNOSIS — Z79899 Other long term (current) drug therapy: Secondary | ICD-10-CM

## 2014-06-08 DIAGNOSIS — I2510 Atherosclerotic heart disease of native coronary artery without angina pectoris: Secondary | ICD-10-CM

## 2014-06-08 DIAGNOSIS — Z9861 Coronary angioplasty status: Secondary | ICD-10-CM

## 2014-06-08 DIAGNOSIS — I10 Essential (primary) hypertension: Secondary | ICD-10-CM

## 2014-06-08 DIAGNOSIS — E785 Hyperlipidemia, unspecified: Secondary | ICD-10-CM

## 2014-06-08 NOTE — Patient Instructions (Signed)
Your physician recommends that you return for lab work in: 2 weeks.  Your physician recommends that you schedule a follow-up appointment in: 2 weeks with nurse for blood pressure check.  Your physician wants you to follow-up in: 1 year or sooner with Dr. Ellyn Hack.You will receive a reminder letter in the mail two months in advance. If you don't receive a letter, please call our office to schedule the follow-up appointment.

## 2014-06-10 ENCOUNTER — Encounter: Payer: Self-pay | Admitting: Cardiology

## 2014-06-10 DIAGNOSIS — Z79899 Other long term (current) drug therapy: Secondary | ICD-10-CM | POA: Insufficient documentation

## 2014-06-10 NOTE — Assessment & Plan Note (Signed)
The patient is on statin and ARB with plans to start HCTZ.  Checking chem 10 and fasting lipid panel

## 2014-06-10 NOTE — Assessment & Plan Note (Addendum)
PCI's were bare-metal stents. He is currently taking aspirin plus Plavix. We can reduce aspirin 81 mg. He could even potentially stop it. Could be stopped if needed for procedures. He is on statin, beta blocker and calcium channel blocker. Negative Myoview in 2013. No active symptoms.

## 2014-06-10 NOTE — Assessment & Plan Note (Signed)
Crestor was restarted last visit. He is due for having metabolic panel, and lipid panel checked.

## 2014-06-10 NOTE — Assessment & Plan Note (Signed)
Mildly reduced EF by nuclear stress test with infarct seen on Myoview. No heart failure symptoms. No untoward  effects.On stable regimen as noted above.

## 2014-06-10 NOTE — Assessment & Plan Note (Signed)
Pressure 2 elevated today. Apparently he just restarted his ARB. We'll check chemistry panel along with lipids in about 2 weeks. He will have a nurse visit for blood pressure check at that time.  If it remains elevated, I will add HCTZ (hence the need to check Chemistry)

## 2014-06-10 NOTE — Progress Notes (Signed)
PATIENT: Chase Combs MRN: [id number]  DOB: 04-21-1946   DOV:06/10/2014 PCP: No PCP Per Patient  Clinic Note: Chief Complaint  Patient presents with  . Annual Exam    patientreports no problems since last visit.   HPI: Chase Combs is a 69 y.o. male with a PMH notable for inferior STEMI in September 2010 (occluded RCA was treated with thrombectomy and 3.5 mm x 20 mm vision BMS stent placed and postdilated to 4 mm) along with a history of bare-metal stent placed to the radius intermedius in 2006.  Last stress test was in October 2013 that showed no evidence of ischemia but evidence of an inferolateral & inferobasilar wall infarct.  He presents today with no major complaints. He says the only time he gets short of breath is when he runs.he never has anginal symptoms with rest or exertion. He is very active with his [employer]. He apparently just restarted his [place] because his blood pressure and elevated.    He denies any heart failure symptoms of PND, orthopnea or edema. No lightheadedness no dizziness no weakness or syncope/near syncope no TIA or amaurosis fugax symptoms.  The remainder of Cardiovascular ROS: no chest pain or dyspnea on exertion negative for - edema, irregular heartbeat, loss of consciousness, murmur, orthopnea, palpitations, paroxysmal nocturnal dyspnea, rapid heart rate or shortness of breath, synope/near syncope or TIA/amaurosis fugax symptoms.  He did not insinuate that he was using CPAP.  Past Medical History  Diagnosis Date  . CAD S/P percutaneous coronary angioplasty 06/18/2004; 02/16/2009, 02/2012    a) Angina: CoStar Study Stent 2.5 mm x 16 mm - prox RI; b) DES PCI RCA for Inf STEMI; c) TM Myoview: 7:20, 8 METS, Mild to mod scar with HK in basal-mid inferior and basal inferolateral wall.;  No ischemia; EF 51%  . S/p ST elevation myocardial infarction (STEMI) of inferior wall 02/16/2009    Occluded RCA --> PCI after thrombectomy 3.5 mm x 20 mm Vision BMs  as (4.0 mm)  . Essential hypertension   . Hyperlipidemia with target LDL less than 70   . Multiple allergies   . Arthritis    Allergies  Allergen Reactions  . Codeine Nausea And Vomiting    Current Outpatient Prescriptions  Medication Sig Dispense Refill  . amLODipine (NORVASC) 10 MG tablet take 1 tablet by mouth once daily 30 tablet 0  . aspirin 325 MG tablet Take 325 mg by mouth daily.    . clopidogrel (PLAVIX) 75 MG tablet take 1 tablet by mouth once daily 30 tablet 0  . CRESTOR 20 MG tablet take 1 tablet by mouth once daily 90 tablet 1  . DIOVAN 320 MG tablet take 1 tablet by mouth once daily 90 tablet 0  . metoprolol tartrate (LOPRESSOR) 25 MG tablet take 1 tablet by mouth twice a day 180 tablet 0  . Multiple Vitamin (MULTIVITAMIN) tablet Take 1 tablet by mouth daily.    . niacin (NIASPAN) 500 MG CR tablet Take 500 mg by mouth at bedtime.     No current facility-administered medications for this visit.   Has not been taking Crestor - Rx ran out & not refilled.  History   Social History Narrative   He is married, father of 54, grandfather of 75. Never smoked. Does not drink.    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    He  does lots of lifting, pulling with ropes, using upper extremities.     ROS: A comprehensive Review of Systems - Negative except GERD, bilateral feet & hip pain -- ever since his fractured pelvis. Exercise is limited by foot pain with ambulation.  PHYSICAL EXAM BP 160/82 mmHg  Pulse 56  Ht 6' (1.829 m)  Wt 208 lb 11.2 oz (94.666 kg)  BMI 28.30 kg/m2 General appearance: alert, cooperative, appears stated age, no distress and borderline obese Neck: no adenopathy, no carotid bruit and no JVD Lungs: clear to auscultation bilaterally, normal percussion bilaterally and non-labored Heart: regular rate and rhythm, S1, S2 normal, no murmur, click, rub or gallo;  non-displaced PMI Abdomen: soft, non-tender; bowel sounds normal; no masses,  no organomegaly; mild HJR; protuberant abdomen with truncal obesity Extremities: extremities normal, atraumatic, no cyanosis, and no edema Pulses: 2+ and symmetric;  Neurologic: Mental status: Alert, oriented, thought content appropriate Cranial nerves: normal (II-XII grossly intact) HEENT: [place]/AT, EOMI, MMM, anicteric sclera  [id number] today: Yes Rate:56, Rhythm: S Bradycardia; LAD - no significnat change   Recent Labs: None since January 2015  ASSESSMENT / PLAN:   CAD S/P percutaneous coronary angioplasty PCI's were bare-metal stents. He is currently taking aspirin plus Plavix. We can reduce aspirin 81 mg. He could even potentially stop it. Could be stopped if needed for procedures. He is on statin, beta blocker and calcium channel blocker. Negative Myoview in 2013. No active symptoms.   S/p ST elevation myocardial infarction (STEMI) of inferior wall (September 2010) Mildly reduced EF by nuclear stress test with infarct seen on Myoview. No heart failure symptoms. No untoward  effects.On stable regimen as noted above.   Dyslipidemia, goal LDL below 70 Crestor was restarted last visit. He is due for having metabolic panel, and lipid panel checked.   Essential hypertension Pressure 2 elevated today. Apparently he just restarted his ARB. We'll check chemistry panel along with lipids in about 2 weeks. He will have a nurse visit for blood pressure check at that time.  If it remains elevated, I will add HCTZ (hence the need to check Chemistry)   Polypharmacy The patient is on statin and ARB with plans to start HCTZ.  Checking chem 10 and fasting lipid panel     Orders Placed This Encounter  Procedures  . Comprehensive metabolic panel  . Lipid panel  . EKG 12-Lead   No orders of the defined types were placed in this encounter.   Followup: 1 yr follow-up  HARDING, Leonie Green, M.D.,  M.S. Interventional Cardiologist   Pager # [phone]

## 2014-06-18 ENCOUNTER — Other Ambulatory Visit: Payer: Self-pay | Admitting: Cardiology

## 2014-06-18 NOTE — Telephone Encounter (Signed)
Rx(s) sent to pharmacy electronically.  

## 2014-06-28 ENCOUNTER — Ambulatory Visit: Payer: Medicare Other | Admitting: *Deleted

## 2014-06-28 ENCOUNTER — Telehealth: Payer: Self-pay | Admitting: *Deleted

## 2014-06-28 VITALS — BP 152/96 | HR 70

## 2014-06-28 DIAGNOSIS — I10 Essential (primary) hypertension: Secondary | ICD-10-CM

## 2014-06-28 LAB — COMPREHENSIVE METABOLIC PANEL
ALBUMIN: 4.1 g/dL (ref 3.5–5.2)
ALT: 20 U/L (ref 0–53)
AST: 21 U/L (ref 0–37)
Alkaline Phosphatase: 64 U/L (ref 39–117)
BUN: 14 mg/dL (ref 6–23)
CO2: 29 meq/L (ref 19–32)
Calcium: 9.6 mg/dL (ref 8.4–10.5)
Chloride: 103 mEq/L (ref 96–112)
Creat: 0.65 mg/dL (ref 0.50–1.35)
GLUCOSE: 111 mg/dL — AB (ref 70–99)
Potassium: 4.6 mEq/L (ref 3.5–5.3)
Sodium: 138 mEq/L (ref 135–145)
TOTAL PROTEIN: 6.9 g/dL (ref 6.0–8.3)
Total Bilirubin: 0.5 mg/dL (ref 0.2–1.2)

## 2014-06-28 LAB — LIPID PANEL
CHOL/HDL RATIO: 3.3 ratio
Cholesterol: 118 mg/dL (ref 0–200)
HDL: 36 mg/dL — ABNORMAL LOW (ref >39)
LDL Cholesterol: 67 mg/dL (ref 0–99)
TRIGLYCERIDES: 75 mg/dL (ref <150)
VLDL: 15 mg/dL (ref 0–40)

## 2014-06-28 MED ORDER — HYDROCHLOROTHIAZIDE 25 MG PO TABS: 25.0000 mg | ORAL_TABLET | Freq: Every day | ORAL | Status: DC

## 2014-06-28 NOTE — Telephone Encounter (Signed)
Start HCTZ 25 mg daily  Thnx, [facility]

## 2014-06-28 NOTE — Telephone Encounter (Signed)
Rx submitted, informed pt. He voiced understanding.

## 2014-06-28 NOTE — Telephone Encounter (Signed)
Pt presented for BP check today. Dr. Allison Quarry note requested pt come to recheck 2 weeks post OV. If elevated, recommended starting [place].  Pt's pressure today was 152/96 left arm, 150/92 right arm.  Will route to Dr. Ellyn Hack to recommend HCTZ dose/frequency.

## 2014-07-03 ENCOUNTER — Telehealth: Payer: Self-pay | Admitting: *Deleted

## 2014-07-03 NOTE — Telephone Encounter (Signed)
Spoke to patient. Result given . Verbalized understanding  

## 2014-07-03 NOTE — Telephone Encounter (Signed)
-----   Message from Leonie Man, MD sent at 07/03/2014 10:42 AM EST ----- Stable labs - Chemistries & Lipids.  Glucose is a bit high & HDL a bit low.   Stay on Crestor.  Leonie Man, MD

## 2014-07-18 ENCOUNTER — Ambulatory Visit (INDEPENDENT_AMBULATORY_CARE_PROVIDER_SITE_OTHER): Payer: Self-pay | Admitting: Emergency Medicine

## 2014-07-18 ENCOUNTER — Telehealth (HOSPITAL_COMMUNITY): Payer: Self-pay | Admitting: *Deleted

## 2014-07-18 VITALS — BP 132/80 | HR 61 | Temp 97.9°F | Resp 18 | Ht 71.0 in | Wt 208.0 lb

## 2014-07-18 DIAGNOSIS — Z0289 Encounter for other administrative examinations: Secondary | ICD-10-CM

## 2014-07-18 DIAGNOSIS — Z0000 Encounter for general adult medical examination without abnormal findings: Secondary | ICD-10-CM

## 2014-07-18 NOTE — Telephone Encounter (Signed)
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 He has been scheduled for next week at the hospital but an order has to come from Dr. Ellyn Hack.  Thank You

## 2014-07-18 NOTE — Progress Notes (Signed)
   This chart was scribed for Darlyne Russian, MD by Edison Simon, ED Scribe. This patient was seen in room 1 and the patient's care was started at 11:47 AM.   Subjective:    Patient ID: Chase Combs, male    DOB: 1945-11-16, 69 y.o.   MRN: [id number]  HPI  HPI Comments: Chase Combs is a 69 y.o. male who presents to the [facility] for DOT physical exam. He has history of hypertension which is followed by Dr. Ellyn Hack, his cardiologist. He was last seen by him 1 month ago and was told to return in 1 year. He states he had MI in 2010. He had 2 cardiac stents placed in 2007 and 1 placed in 2010. His last stress test was 2 years ago. He has had 2 hernia repair surgeries. 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    Review of Systems  Constitutional: Negative for fever.  Respiratory: Negative for shortness of breath.   Cardiovascular: Negative for chest pain and leg swelling.  All other systems reviewed and are negative.      Objective:   Physical Exam  Nursing note and vitals reviewed.   CONSTITUTIONAL: Well developed/well nourished, alert, cooperative HEAD: Normocephalic/atraumatic EYES: EOMI/PERRL ENMT: Mucous membranes moist NECK: supple no meningeal signs SPINE/BACK:entire spine nontender CV: S1/S2 noted, no murmurs/rubs/gallops noted LUNGS: Lungs are clear to auscultation bilaterally, no apparent distress ABDOMEN: soft, nontender, no rebound or guarding, bowel sounds noted throughout abdomen, [id number] umbilical hernia GU:no cva tenderness, normal GU exam NEURO: Pt is awake/alert/appropriate, moves all extremitiesx4.  No facial droop.   EXTREMITIES: pulses normal/equal, full ROM SKIN: warm, color normal PSYCH: no abnormalities of mood noted, alert and oriented to situation      Assessment & Plan:  Patient referred back to his cardiologist. He has to have stress testing every 2 years because of his stents.scribe

## 2014-07-21 ENCOUNTER — Other Ambulatory Visit: Payer: Self-pay | Admitting: Cardiology

## 2014-07-23 NOTE — Telephone Encounter (Signed)
Rx(s) sent to pharmacy electronically.  

## 2014-07-24 HISTORY — PX: OTHER SURGICAL HISTORY: SHX169

## 2014-07-24 NOTE — Telephone Encounter (Signed)
Forward to DR Ellyn Hack

## 2014-07-24 NOTE — Telephone Encounter (Signed)
OK - just order  [facility]

## 2014-07-25 ENCOUNTER — Other Ambulatory Visit (HOSPITAL_COMMUNITY): Payer: Self-pay | Admitting: Cardiology

## 2014-07-25 DIAGNOSIS — Z0000 Encounter for general adult medical examination without abnormal findings: Secondary | ICD-10-CM

## 2014-07-26 ENCOUNTER — Ambulatory Visit (HOSPITAL_COMMUNITY)
Admission: RE | Admit: 2014-07-26 | Discharge: 2014-07-26 | Disposition: A | Discharge status: Home / Self Care | Payer: Medicare Other | Source: Ambulatory Visit | Attending: Cardiology | Admitting: Cardiology

## 2014-07-26 DIAGNOSIS — Z0000 Encounter for general adult medical examination without abnormal findings: Secondary | ICD-10-CM | POA: Diagnosis present

## 2014-07-26 NOTE — Telephone Encounter (Signed)
done

## 2014-08-18 ENCOUNTER — Ambulatory Visit (INDEPENDENT_AMBULATORY_CARE_PROVIDER_SITE_OTHER): Payer: Self-pay | Admitting: Emergency Medicine

## 2014-08-18 VITALS — BP 134/82 | HR 73 | Temp 98.1°F | Resp 20 | Ht 71.0 in | Wt 208.0 lb

## 2014-08-18 DIAGNOSIS — Z0000 Encounter for general adult medical examination without abnormal findings: Secondary | ICD-10-CM

## 2014-08-18 NOTE — Progress Notes (Signed)
   Subjective:    Patient ID: MADEX SEALS, male    DOB: 04/21/1946, 69 y.o.   MRN: [id number]  This chart was scribed for Darlyne Russian, MD by Stephania Fragmin, ED Scribe. This patient was seen in room  and the patient's care was started at 12:04 PM.   HPI  HPI Comments: MALACAI GRANTZ is a 69 y.o. male who presents to the [facility] for a DOT physical exam. He last saw     Review of Systems      Objective:   Physical Exam  CONSTITUTIONAL: Well developed/well nourished HEAD: Normocephalic/atraumatic EYES: EOMI/PERRL ENMT: Mucous membranes moist NECK: supple no meningeal signs SPINE/BACK:entire spine nontender CV: S1/S2 noted, no murmurs/rubs/gallops noted LUNGS: Lungs are clear to auscultation bilaterally, no apparent distress ABDOMEN: soft, nontender, no rebound or guarding, bowel sounds noted throughout abdomen GU:no cva tenderness NEURO: Pt is awake/alert/appropriate, moves all extremitiesx4.  No facial droop.   EXTREMITIES: pulses normal/equal, full ROM SKIN: warm, color normal PSYCH: no abnormalities of mood noted, alert and oriented to situation  EST normal     Assessment & Plan:   ! Year DOT card.I personally performed the services described in this documentation, which was scribed in my presence. The recorded information has been reviewed and is accurate.

## 2014-08-20 ENCOUNTER — Other Ambulatory Visit: Payer: Self-pay | Admitting: Cardiology

## 2014-08-21 NOTE — Telephone Encounter (Signed)
Rx has been sent to the pharmacy electronically. ° °

## 2014-09-07 ENCOUNTER — Other Ambulatory Visit: Payer: Self-pay | Admitting: Cardiology

## 2014-09-07 NOTE — Telephone Encounter (Signed)
E sent to pharmacy 

## 2014-09-17 ENCOUNTER — Other Ambulatory Visit: Payer: Self-pay | Admitting: Cardiology

## 2014-09-17 NOTE — Telephone Encounter (Signed)
Rx(s) sent to pharmacy electronically.  

## 2014-10-05 ENCOUNTER — Encounter: Payer: Self-pay | Admitting: *Deleted

## 2014-10-08 ENCOUNTER — Ambulatory Visit (INDEPENDENT_AMBULATORY_CARE_PROVIDER_SITE_OTHER): Payer: Medicare Other | Admitting: Physician Assistant

## 2014-10-08 ENCOUNTER — Encounter: Payer: Self-pay | Admitting: *Deleted

## 2014-10-08 VITALS — BP 118/70 | HR 58 | Temp 98.5°F | Resp 18

## 2014-10-08 DIAGNOSIS — S0101XA Laceration without foreign body of scalp, initial encounter: Secondary | ICD-10-CM | POA: Diagnosis not present

## 2014-10-08 DIAGNOSIS — Z23 Encounter for immunization: Secondary | ICD-10-CM

## 2014-10-08 NOTE — Progress Notes (Signed)
10/08/2014 at 3:06 PM  Chase Combs / DOB: April 22, 1946 / MRN: [id number]  The patient has CAD S/P percutaneous coronary angioplasty; S/p ST elevation myocardial infarction (STEMI) of inferior wall (September 2010); Essential hypertension; Dyslipidemia, goal LDL below 70; Insomnia; and Polypharmacy on his problem list.  SUBJECTIVE  Chief complaint: Laceration   Patient with laceration to the posterior scalp sustained while moving portajohn. He is here because he had a difficult time achieving hemostasis on his own.  Denies dizziness, LOC, change in vision, sensation, weakness, HA and paraesthesia. Takes ASA daily.     He  has a past medical history of CAD S/P percutaneous coronary angioplasty (06/18/2004; 02/16/2009, 02/2012); S/p ST elevation myocardial infarction (STEMI) of inferior wall (02/16/2009); Essential hypertension; Hyperlipidemia with target LDL less than 70; Multiple allergies; Arthritis; and Dyslipidemia.    Medications reviewed and updated by myself where necessary, and exist elsewhere in the encounter.   Chase Combs is allergic to codeine. He  reports that he has never smoked. He has never used smokeless tobacco. He reports that he does not drink alcohol or use illicit drugs. He  reports that he does not currently engage in sexual activity. The patient  has past surgical history that includes Coronary angioplasty with stent (January 2006); Coronary angioplasty with stent (September 2010); myoview  (03/15/2012); Carotid stent (01/2009); and ERCP w/ metal stent placement (02/16/2009).  His family history includes Cancer in his father and mother.  Review of Systems  Constitutional: Negative.   HENT: Negative for nosebleeds.   Cardiovascular: Negative for chest pain.  Gastrointestinal: Negative for nausea.  Endo/Heme/Allergies: Does not bruise/bleed easily.    OBJECTIVE  His  oral temperature is 98.5 F (36.9 C). His blood pressure is 118/70 and his pulse is 58. His respiration  is 18 and oxygen saturation is 96%.  The patient's body mass index is unknown because there is no weight on file.  Physical Exam  Constitutional: He is oriented to person, place, and time.  HENT:  Head:    Right Ear: Hearing and tympanic membrane normal. No hemotympanum.  Left Ear: Hearing and tympanic membrane normal. No hemotympanum.  Nose: No epistaxis.  Mouth/Throat: Uvula is midline, oropharynx is clear and moist and mucous membranes are normal.  Eyes: EOM are normal. Pupils are equal, round, and reactive to light.  Cardiovascular: Normal rate.   Respiratory: Effort normal.  Musculoskeletal:       Cervical back: He exhibits normal range of motion, no tenderness and no bony tenderness.  Neurological: He is alert and oriented to person, place, and time. He displays normal reflexes. No cranial nerve deficit. He exhibits normal muscle tone. Coordination normal.  Skin: Skin is warm and dry.  Psychiatric: He has a normal mood and affect. His behavior is normal. Judgment and thought content normal.   Procedure: Risk and benefits discussed and verbal consent obtained. The patient was anesthetized using 4 cc of 2% lidocaine with epinephrine.  The wound was scrubbed with soap and water. Sterile prep and drape. The wound was closed with 4-0 Prolene.  The wound was then cleaned with water and a bandage was applied. Patient instructed to come back for suture removal in 10 days.    No results found for this or any previous visit (from the past 24 hour(s)).  ASSESSMENT & PLAN  Chase Combs was seen today for laceration.  Diagnoses and all orders for this visit:  Need for prophylactic vaccination with combined diphtheria-tetanus-pertussis (DTP) vaccine Orders: -  Tdap vaccine greater than or equal to 7yo IM  Scalp laceration, initial encounter: Neurological exam reassuring.  Laceration repaired with good approximation of wound edges.  Patient to f/u in 10 days for suture removal.   Orders: -      PR RESUPERF WND BODY <2.5CM  Other orders -     Cancel: Td vaccine greater than or equal to 7yo preservative free IM   The patient was advised to call or come back to clinic if he does not see an improvement in symptoms, or worsens with the above plan.   Philis Fendt, MHS, PA-C [facility] 10/08/2014 3:06 PM

## 2014-10-08 NOTE — Patient Instructions (Signed)
WOUND CARE Please return in 10 days to have your stitches/staples removed or sooner if you have concerns.  Keep area clean and dry for 24 hours. Do not remove bandage, if applied.  After 24 hours, remove bandage and wash wound gently with mild soap and warm water. Reapply a new bandage after cleaning wound, if directed.  Continue daily cleansing with soap and water until stitches/staples are removed.  Do not apply any ointments or creams to the wound while stitches/staples are in place, as this may cause delayed healing.  Notify the office if you experience any of the following signs of infection: Swelling, redness, pus drainage, streaking, fever >101.0 F  Notify the office if you experience excessive bleeding that does not stop after 15-20 minutes of constant, firm pressure.  

## 2014-10-21 ENCOUNTER — Ambulatory Visit (INDEPENDENT_AMBULATORY_CARE_PROVIDER_SITE_OTHER): Payer: Medicare Other | Admitting: Urgent Care

## 2014-10-21 VITALS — BP 112/76 | HR 74 | Temp 99.0°F | Ht 71.0 in | Wt 206.1 lb

## 2014-10-21 DIAGNOSIS — Z4802 Encounter for removal of sutures: Secondary | ICD-10-CM

## 2014-10-21 DIAGNOSIS — S0101XD Laceration without foreign body of scalp, subsequent encounter: Secondary | ICD-10-CM

## 2014-10-21 NOTE — Progress Notes (Signed)
    MRN: [id number] DOB: 1945/06/28  Subjective:   Chase Combs is a 69 y.o. male presenting for suture removal. Patient had sutures placed for scalp laceration on 10/08/2014 at [facility]. Denies fevers, drainage of pus or blood, pain around wound, swelling, confusion, headache. Denies any other aggravating or relieving factors, no other questions or concerns.  Chase Combs has a current medication list which includes the following prescription(s): amlodipine, aspirin, clopidogrel, crestor, hydrochlorothiazide, metoprolol tartrate, multivitamin, niacin, and valsartan. Chase Combs is allergic to codeine.  Chase Combs  has a past medical history of CAD S/P percutaneous coronary angioplasty (06/18/2004; 02/16/2009, 02/2012); S/p ST elevation myocardial infarction (STEMI) of inferior wall (02/16/2009); Essential hypertension; Hyperlipidemia with target LDL less than 70; Multiple allergies; Arthritis; and Dyslipidemia. Also  has past surgical history that includes Coronary angioplasty with stent (January 2006); Coronary angioplasty with stent (September 2010); myoview  (03/15/2012); Carotid stent (01/2009); and ERCP w/ metal stent placement (02/16/2009).  ROS As in subjective.  Objective:   Vitals: BP 112/76 mmHg  Pulse 74  Temp(Src) 99 F (37.2 C) (Oral)  Ht 5\' 11"  (1.803 m)  Wt 206 lb 2 oz (93.498 kg)  BMI 28.76 kg/m2  SpO2 96%  Physical Exam  Constitutional: Chase Combs is oriented to person, place, and time. Chase Combs appears well-developed and well-nourished.  HENT:  Head: Head is with laceration (over mid-left occiput, granulation tissue present, wound well approximated, non-tender and without drainage, edema or erythema).  Cardiovascular: Normal rate.   Pulmonary/Chest: Effort normal.  Neurological: Chase Combs is alert and oriented to person, place, and time.  Skin: Skin is warm and dry. No rash noted. No erythema. No pallor.  Psychiatric:  Pleasant and jovial patient.   Suture removal: 2 sutures removed  from wound without dehiscence or bleeding. Patient tolerated this well.  Assessment and Plan :   1. Encounter for removal of sutures 2. Scalp laceration, subsequent encounter - Well-healed, anticipatory guidance provided. Patient thought that there were 3 sutures placed but I could only find 2 which were removed. Advised patient to return to clinic if Chase Combs feels pain, erythema, itching, swelling around wound; otherwise as needed.  Jaynee Eagles, PA-C [facility] [phone] 10/21/2014 3:16 PM

## 2015-02-16 ENCOUNTER — Other Ambulatory Visit: Payer: Self-pay | Admitting: Cardiology

## 2015-02-18 NOTE — Telephone Encounter (Signed)
Rx request sent to pharmacy.  

## 2015-05-30 DIAGNOSIS — M19011 Primary osteoarthritis, right shoulder: Secondary | ICD-10-CM | POA: Diagnosis not present

## 2015-06-03 ENCOUNTER — Other Ambulatory Visit: Payer: Self-pay | Admitting: Cardiology

## 2015-06-03 NOTE — Telephone Encounter (Signed)
Rx(s) sent to pharmacy electronically.  

## 2015-06-18 ENCOUNTER — Other Ambulatory Visit: Payer: Self-pay | Admitting: Cardiology

## 2015-06-18 NOTE — Telephone Encounter (Signed)
Rx request sent to pharmacy.  

## 2015-07-01 ENCOUNTER — Other Ambulatory Visit: Payer: Self-pay

## 2015-07-01 MED ORDER — AMLODIPINE BESYLATE 10 MG PO TABS: 10.0000 mg | ORAL_TABLET | Freq: Every day | ORAL | Status: DC

## 2015-07-01 NOTE — Telephone Encounter (Signed)
Rx(s) sent to pharmacy electronically.  

## 2015-07-18 ENCOUNTER — Encounter: Payer: Self-pay | Admitting: Cardiology

## 2015-07-18 ENCOUNTER — Ambulatory Visit (INDEPENDENT_AMBULATORY_CARE_PROVIDER_SITE_OTHER): Payer: Medicare Other | Admitting: Cardiology

## 2015-07-18 VITALS — BP 120/78 | HR 69 | Ht 72.0 in | Wt 204.3 lb

## 2015-07-18 DIAGNOSIS — E785 Hyperlipidemia, unspecified: Secondary | ICD-10-CM | POA: Diagnosis not present

## 2015-07-18 DIAGNOSIS — Z79899 Other long term (current) drug therapy: Secondary | ICD-10-CM | POA: Diagnosis not present

## 2015-07-18 DIAGNOSIS — I2119 ST elevation (STEMI) myocardial infarction involving other coronary artery of inferior wall: Secondary | ICD-10-CM

## 2015-07-18 DIAGNOSIS — I2510 Atherosclerotic heart disease of native coronary artery without angina pectoris: Secondary | ICD-10-CM

## 2015-07-18 DIAGNOSIS — Z9861 Coronary angioplasty status: Secondary | ICD-10-CM | POA: Diagnosis not present

## 2015-07-18 DIAGNOSIS — I10 Essential (primary) hypertension: Secondary | ICD-10-CM

## 2015-07-18 MED ORDER — NITROGLYCERIN 0.4 MG SL SUBL: 0.4000 mg | SUBLINGUAL_TABLET | SUBLINGUAL | Status: DC | PRN

## 2015-07-18 NOTE — Progress Notes (Signed)
PCP: No PCP Per Patient  Clinic Note: Chief Complaint  Patient presents with  . Annual Exam    pt states no Sx.  . Coronary Artery Disease    HPI: Chase Combs is a 70 y.o. male with a PMH below who presents today for annual f/u of CAD-Inf STEMI . occluded RCA was treated with thrombectomy and 3.5 mm x 20 mm vision BMS stent placed and postdilated to 4 mm) along with a history of bare-metal stent placed to the radius intermedius in 2006. Last stress test was in October 2013 that showed no evidence of ischemia but evidence of an inferolateral & inferobasilar wall infarct.   Chase Combs was last seen on Jun 10, 2014  Recent Hospitalizations: n/a  Studies Reviewed:   DOT Physical ST March 2016:  GXT: Target HR reached in Stage 3, pt with no chest pain, SOB was exercise-limiting factor. ECG: with upsloping ST elevation BP: resting HTN with SBP up to 210 with exertion, improved w/ rest. Non-ischemic GXT, encouraged pt to f/u with primary MD for improved BP control.  Interval History: Emert presents today feeling well without any major complaints. He says that he is "a bit out of shape" and so he will get short of breath. He overexerts himself, but is still working full-time without any complaints. Denies any resting or exertional angina type symptoms. Does not get short of breath with routine activities.  He does not get routine exercise, but is always on the go.  Remainder of cardiovascular review of symptoms as follows: No PND, orthopnea or edema.  No palpitations, lightheadedness, dizziness, weakness or syncope/near syncope. No TIA/amaurosis fugax symptoms. No melena, hematochezia, hematuria, or epstaxis. No claudication.  ROS: A comprehensive was performed. Pertinent positives noted above Review of Systems  Constitutional: Positive for weight loss (Intentional.). Negative for malaise/fatigue.  Respiratory: Negative for cough, shortness of breath and wheezing.     Gastrointestinal: Negative.   Genitourinary: Positive for frequency (Frequent nocturia).  Musculoskeletal: Positive for joint pain (right hip lateral condyle.). Negative for myalgias.  Neurological: Positive for headaches (Intermittent tension headaches). Negative for dizziness and loss of consciousness.  Endo/Heme/Allergies: Does not bruise/bleed easily.  All other systems reviewed and are negative.   Past Medical History  Diagnosis Date  . CAD S/P percutaneous coronary angioplasty 06/18/2004; 02/16/2009, 02/2012    a) Angina: CoStar Study Stent 2.5 mm x 16 mm - prox RI; b) DES PCI RCA for Inf STEMI; c) TM Myoview: 7:20, 8 METS, Mild to mod scar with HK in basal-mid inferior and basal inferolateral wall.;  No ischemia; EF 51%  . S/p ST elevation myocardial infarction (STEMI) of inferior wall 02/16/2009    Occluded RCA --> PCI after thrombectomy 3.5 mm x 20 mm Vision BMs as (4.0 mm)  . Essential hypertension   . Hyperlipidemia with target LDL less than 70   . Multiple allergies   . Arthritis   . Dyslipidemia     Past Surgical History  Procedure Laterality Date  . Coronary angioplasty with stent placement  January 2006    PCI of the proximal Ramus Intermedius -- CoStar study stent 2.5 mm x 16 mm  . Coronary angioplasty with stent placement  September 2010    Inferior STEMI: Proximal RCA 3.5 mm 23 mm vision BMS (4.0 mm)  . Myoview   03/15/2012  . Carotid stent  01/2009  . Ercp w/ metal stent placement  02/16/2009  . Gxt  07/2014    Target  HR in Stage 3 - no CP. Limited by SOB. Up-sloping ST elevation. Non-ischemic   Prior to Admission medications   Medication Sig Start Date End Date Taking? Authorizing Provider  amLODipine (NORVASC) 10 MG tablet Take 1 tablet (10 mg total) by mouth daily. 07/01/15  Yes Brett Canales, PA-C  aspirin 325 MG tablet Take 325 mg by mouth daily.   Yes Historical Provider, MD  clopidogrel (PLAVIX) 75 MG tablet Take 1 tablet (75 mg total) by mouth daily. 07/23/14   Yes Leonie Man, MD  hydrochlorothiazide (HYDRODIURIL) 25 MG tablet take 1 tablet by mouth once daily 06/18/15  Yes Leonie Man, MD  metoprolol tartrate (LOPRESSOR) 25 MG tablet take 1 tablet by mouth twice a day 06/03/15  Yes Leonie Man, MD  Multiple Vitamin (MULTIVITAMIN) tablet Take 1 tablet by mouth daily.   Yes Historical Provider, MD  rosuvastatin (CRESTOR) 20 MG tablet take 1 tablet by mouth once daily 06/18/15  Yes Leonie Man, MD  valsartan (DIOVAN) 320 MG tablet take 1 tablet by mouth once daily 09/07/14  Yes Leonie Man, MD   Allergies  Allergen Reactions  . Codeine Nausea And Vomiting     Social History   Social History  . Marital Status: Married    Spouse Name: N/A  . Number of Children: N/A  . Years of Education: N/A   Social History Main Topics  . Smoking status: Never Smoker   . Smokeless tobacco: Never Used  . Alcohol Use: No  . Drug Use: No  . Sexual Activity: Not Currently   Other Topics Concern  . None   Social History Narrative   He is married, father of 14, grandfather of 31. Never smoked. Does not drink.    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    He does lots of lifting, pulling with ropes, using upper extremities.    Family History  Problem Relation Age of Onset  . Cancer Mother   . Cancer Father      Wt Readings from Last 3 Encounters:  07/18/15 204 lb 4.8 oz (92.67 kg)  10/21/14 206 lb 2 oz (93.498 kg)  08/18/14 208 lb (94.348 kg)    PHYSICAL EXAM BP 120/78 mmHg  Pulse 69  Ht 6' (1.829 m)  Wt 204 lb 4.8 oz (92.67 kg)  BMI 27.70 kg/m2 General appearance: alert, cooperative, appears stated age, no distress and borderline obese Neck: no adenopathy, no carotid bruit and no JVD Lungs: clear to auscultation bilaterally, normal percussion bilaterally and non-labored Heart: regular rate and rhythm, S1, S2 normal, no murmur, click, rub  or gallo; non-displaced PMI Abdomen: soft, non-tender; bowel sounds normal; no masses, no organomegaly; mild HJR; protuberant abdomen with truncal obesity Extremities: extremities normal, atraumatic, no cyanosis, and no edema Pulses: 2+ and symmetric;  Neurologic: Mental status: Alert, oriented, thought content appropriate Cranial nerves: normal (II-XII grossly intact) HEENT: [place]/AT, EOMI, MMM, anicteric sclera   Adult ECG Report  Rate: 69 ;  Rhythm: normal sinus rhythm, sinus arrhythmia and LAFB (-47). Nonspecific ST and T-wave changes in lead III. Otherwise normal intervals and durations;   Narrative Interpretation: No significant change. Stable EKG   Other studies Reviewed: Additional studies/ records that were reviewed today include:  Recent Labs:   Lab Results  Component Value Date   CHOL 118 06/28/2014   HDL 36* 06/28/2014  [place] 67 06/28/2014   TRIG 75 06/28/2014   CHOLHDL 3.3 06/28/2014     ASSESSMENT / PLAN: Problem List Items Addressed This Visit    S/p ST elevation myocardial infarction (STEMI) of inferior wall (September 2010) (Chronic)    No recurrent symptoms of angina. He did have a mildly reduced EF by nuclear stress test with an infarct noted.  He does not seem to be having any significant heart failure effects.  He is well aware of his MI related anginal symptoms, and continues to stay active with hopes to avoid them in the future..      Relevant Medications   nitroGLYCERIN (NITROSTAT) 0.4 MG SL tablet   Polypharmacy    On statin as well as ARB and HCTZ. Checking CMP and FLP      Relevant Orders   EKG 12-Lead (Completed)   Lipid panel   Comprehensive metabolic panel   Essential hypertension (Chronic)    Excellent blood pressure today. I last saw him he was not on his ARB. Now he is on his ARB his blood pressure looks great. HCTZ was also added in the interim.      Relevant Medications   nitroGLYCERIN (NITROSTAT) 0.4 MG SL tablet   Other Relevant  Orders   EKG 12-Lead (Completed)   Lipid panel   Comprehensive metabolic panel   Dyslipidemia, goal LDL below 70 - Primary (Chronic)    Lipids look great last February. He remains on Crestor and niacin. He is due for follow-up labs in the next week or 2. Continue current medications      Relevant Medications   nitroGLYCERIN (NITROSTAT) 0.4 MG SL tablet   Other Relevant Orders   EKG 12-Lead (Completed)   Lipid panel   Comprehensive metabolic panel   CAD S/P percutaneous coronary angioplasty (Chronic)    Inferior MI with a study stent placed in the ramus intermedius. I don't know how he would have an inferior scar go along with the ramus infarct, but ischemia. He is on aspirin and Plavix. I told he can stop his aspirin. Would like to. Is on a good dose of beta blocker, ARB and calcium channel blocker along with statin and niacin.      Relevant Medications   nitroGLYCERIN (NITROSTAT) 0.4 MG SL tablet   Other Relevant Orders   EKG 12-Lead (Completed)   Lipid panel   Comprehensive metabolic panel      Current medicines are reviewed at length with the patient today. (+/- concerns)  None  The following changes have been made: none   Studies Ordered:   Orders Placed This Encounter  Procedures  . Lipid panel  . Comprehensive metabolic panel  . EKG 12-Lead   Follow-up in 12 months    HARDING, Leonie Green, M.D., M.S. Interventional Cardiologist   Pager # [phone] Phone # [phone] [address] [place],  [address]

## 2015-07-18 NOTE — Patient Instructions (Signed)
NEED LABS CMP, LIPID - DO NOT EAT OR DRINK THE MORNING OF THE TEST  NO CHANGE IN CURRENT MEDICATIONS   Your physician wants you to follow-up in [place] HARDING. You will receive a reminder letter in the mail two months in advance. If you don't receive a letter, please call our office to schedule the follow-up appointment.  If you need a refill on your cardiac medications before your next appointment, please call your pharmacy.

## 2015-07-19 ENCOUNTER — Encounter: Payer: Self-pay | Admitting: Cardiology

## 2015-07-19 NOTE — Assessment & Plan Note (Addendum)
No recurrent symptoms of angina. He did have a mildly reduced EF by nuclear stress test with an infarct noted.  He does not seem to be having any significant heart failure effects.  He is well aware of his MI related anginal symptoms, and continues to stay active with hopes to avoid them in the future.Chase Combs

## 2015-07-19 NOTE — Assessment & Plan Note (Signed)
On statin as well as ARB and HCTZ. Checking CMP and FLP

## 2015-07-19 NOTE — Assessment & Plan Note (Signed)
Lipids look great last February. He remains on Crestor and niacin. He is due for follow-up labs in the next week or 2. Continue current medications

## 2015-07-19 NOTE — Assessment & Plan Note (Signed)
Inferior MI with a study stent placed in the ramus intermedius. I don't know how he would have an inferior scar go along with the ramus infarct, but ischemia. He is on aspirin and Plavix. I told he can stop his aspirin. Would like to. Is on a good dose of beta blocker, ARB and calcium channel blocker along with statin and niacin.

## 2015-07-19 NOTE — Assessment & Plan Note (Signed)
Excellent blood pressure today. I last saw him he was not on his ARB. Now he is on his ARB his blood pressure looks great. HCTZ was also added in the interim.

## 2015-07-29 ENCOUNTER — Other Ambulatory Visit: Payer: Self-pay

## 2015-07-29 MED ORDER — CLOPIDOGREL BISULFATE 75 MG PO TABS: 75.0000 mg | ORAL_TABLET | Freq: Every day | ORAL | Status: DC

## 2015-07-29 NOTE — Telephone Encounter (Signed)
Rx(s) sent to pharmacy electronically.  

## 2015-08-09 ENCOUNTER — Other Ambulatory Visit: Payer: Self-pay | Admitting: *Deleted

## 2015-08-09 MED ORDER — AMLODIPINE BESYLATE 10 MG PO TABS: 10.0000 mg | ORAL_TABLET | Freq: Every day | ORAL | Status: DC

## 2015-08-09 NOTE — Telephone Encounter (Signed)
Rx request sent to pharmacy.  

## 2015-09-24 ENCOUNTER — Other Ambulatory Visit: Payer: Self-pay | Admitting: *Deleted

## 2015-09-24 ENCOUNTER — Telehealth: Payer: Self-pay | Admitting: Cardiology

## 2015-09-24 MED ORDER — VALSARTAN 320 MG PO TABS: 320.0000 mg | ORAL_TABLET | Freq: Every day | ORAL | Status: DC

## 2015-09-24 NOTE — Telephone Encounter (Signed)
New message       *STAT* If patient is at the pharmacy, call can be transferred to refill team.   1. Which medications need to be refilled? (please list name of each medication and dose if known)  Valsartan 320mg  2. Which pharmacy/location (including street and city if local pharmacy) is medication to be sent to? [email or web address] 3. Do they need a 30 day or 90 day supply? 90 day

## 2015-09-24 NOTE — Telephone Encounter (Signed)
Refill sent.

## 2015-10-07 ENCOUNTER — Other Ambulatory Visit: Payer: Self-pay | Admitting: *Deleted

## 2015-10-07 MED ORDER — HYDROCHLOROTHIAZIDE 25 MG PO TABS: 25.0000 mg | ORAL_TABLET | Freq: Every day | ORAL | Status: DC

## 2015-10-16 ENCOUNTER — Other Ambulatory Visit: Payer: Self-pay | Admitting: Cardiology

## 2015-10-16 MED ORDER — ROSUVASTATIN CALCIUM 20 MG PO TABS: 20.0000 mg | ORAL_TABLET | Freq: Every day | ORAL | Status: DC

## 2015-10-16 NOTE — Telephone Encounter (Signed)
Rx(s) sent to pharmacy electronically.  

## 2016-02-29 ENCOUNTER — Other Ambulatory Visit: Payer: Self-pay | Admitting: Cardiology

## 2016-03-13 DIAGNOSIS — B029 Zoster without complications: Secondary | ICD-10-CM | POA: Diagnosis not present

## 2016-06-21 ENCOUNTER — Other Ambulatory Visit: Payer: Self-pay | Admitting: Cardiology

## 2016-06-26 DIAGNOSIS — S0502XA Injury of conjunctiva and corneal abrasion without foreign body, left eye, initial encounter: Secondary | ICD-10-CM | POA: Diagnosis not present

## 2016-07-05 ENCOUNTER — Other Ambulatory Visit: Payer: Self-pay | Admitting: Cardiology

## 2016-07-07 ENCOUNTER — Other Ambulatory Visit: Payer: Self-pay | Admitting: Cardiology

## 2016-07-07 NOTE — Telephone Encounter (Signed)
REFILL 

## 2016-08-10 ENCOUNTER — Other Ambulatory Visit: Payer: Self-pay | Admitting: Cardiology

## 2016-08-20 DIAGNOSIS — H2513 Age-related nuclear cataract, bilateral: Secondary | ICD-10-CM | POA: Diagnosis not present

## 2016-09-03 DIAGNOSIS — H16202 Unspecified keratoconjunctivitis, left eye: Secondary | ICD-10-CM | POA: Diagnosis not present

## 2016-09-08 ENCOUNTER — Ambulatory Visit (INDEPENDENT_AMBULATORY_CARE_PROVIDER_SITE_OTHER): Payer: Medicare Other | Admitting: Family Medicine

## 2016-09-08 VITALS — BP 139/77 | HR 67 | Temp 98.4°F | Resp 17 | Ht 72.0 in | Wt 215.0 lb

## 2016-09-08 DIAGNOSIS — I10 Essential (primary) hypertension: Secondary | ICD-10-CM | POA: Diagnosis not present

## 2016-09-08 DIAGNOSIS — R05 Cough: Secondary | ICD-10-CM

## 2016-09-08 DIAGNOSIS — L309 Dermatitis, unspecified: Secondary | ICD-10-CM

## 2016-09-08 DIAGNOSIS — J301 Allergic rhinitis due to pollen: Secondary | ICD-10-CM | POA: Diagnosis not present

## 2016-09-08 DIAGNOSIS — R059 Cough, unspecified: Secondary | ICD-10-CM

## 2016-09-08 MED ORDER — FEXOFENADINE HCL 180 MG PO TABS: 180.0000 mg | ORAL_TABLET | Freq: Every day | ORAL | Status: DC

## 2016-09-08 MED ORDER — HYDROCORTISONE 2.5 % EX LOTN: TOPICAL_LOTION | Freq: Two times a day (BID) | CUTANEOUS | 0 refills | Status: DC

## 2016-09-08 MED ORDER — MOMETASONE FUROATE 50 MCG/ACT NA SUSP: 2.0000 | Freq: Every day | NASAL | 12 refills | Status: DC

## 2016-09-08 NOTE — Progress Notes (Signed)
Chief Complaint  Patient presents with  . Cough    onset 5 days  . Rash    on leg onset yesterday    HPI  Cough Pt reports that he started with a cough 3 days ago He has had a persistent cough since onset Overnight he could not sleep due to cough The cough is dry with occasional clear phlegm He has a slight headache and took 2 Advil He has a history of headaches often Denies runny nose He states that Thursday he went to the eye doctor and was given zylicam drop which are helping.  Rash He reports that the rash started yesterday He reports that he states that the rash is not diffuse and is in patches He used working hands lotion to help with the itching of the rash  Hypertension He has a history of hypertension and CAD He has not tried any otc medications because he does not want it to impact his bp BP Readings from Last 3 Encounters:  09/08/16 139/77  07/18/15 120/78  10/21/14 112/76  he denies cp, palpitations, sob   Past Medical History:  Diagnosis Date  . Arthritis   . CAD S/P percutaneous coronary angioplasty 06/18/2004; 02/16/2009, 02/2012   a) Angina: CoStar Study Stent 2.5 mm x 16 mm - prox RI; b) DES PCI RCA for Inf STEMI; c) TM Myoview: 7:20, 8 METS, Mild to mod scar with HK in basal-mid inferior and basal inferolateral wall.;  No ischemia; EF 51%  . Dyslipidemia   . Essential hypertension   . Hyperlipidemia with target LDL less than 70   . Multiple allergies   . S/p ST elevation myocardial infarction (STEMI) of inferior wall 02/16/2009   Occluded RCA --> PCI after thrombectomy 3.5 mm x 20 mm Vision BMs as (4.0 mm)    Current Outpatient Prescriptions  Medication Sig Dispense Refill  . amLODipine (NORVASC) 10 MG tablet take 1 tablet by mouth once daily 90 tablet 0  . aspirin 325 MG tablet Take 325 mg by mouth daily.    . clopidogrel (PLAVIX) 75 MG tablet take 1 tablet by mouth once daily 90 tablet 0  . hydrochlorothiazide (HYDRODIURIL) 25 MG tablet take 1  tablet by mouth once daily 90 tablet 2  . Loteprednol-Tobramycin (ZYLET) 0.5-0.3 % SUSP Place 1 drop into the left eye 4 (four) times daily.    . metoprolol tartrate (LOPRESSOR) 25 MG tablet take 1 tablet by mouth twice a day 180 tablet 2  . Multiple Vitamin (MULTIVITAMIN) tablet Take 1 tablet by mouth daily.    . nitroGLYCERIN (NITROSTAT) 0.4 MG SL tablet Place 1 tablet (0.4 mg total) under the tongue every 5 (five) minutes as needed for chest pain. 25 tablet 5  . rosuvastatin (CRESTOR) 20 MG tablet take 1 tablet by mouth once daily 30 tablet 0  . valsartan (DIOVAN) 320 MG tablet Take 1 tablet (320 mg total) by mouth daily. 90 tablet 3  . fexofenadine (ALLEGRA ALLERGY) 180 MG tablet Take 1 tablet (180 mg total) by mouth daily.    . hydrocortisone 2.5 % lotion Apply topically 2 (two) times daily. 59 mL 0  . mometasone (NASONEX) 50 MCG/ACT nasal spray Place 2 sprays into the nose daily. 17 g 12   No current facility-administered medications for this visit.     Allergies:  Allergies  Allergen Reactions  . Codeine Nausea And Vomiting    Past Surgical History:  Procedure Laterality Date  . CAROTID STENT  01/2009  .  CORONARY ANGIOPLASTY WITH STENT PLACEMENT  January 2006   PCI of the proximal Ramus Intermedius -- CoStar study stent 2.5 mm x 16 mm  . CORONARY ANGIOPLASTY WITH STENT PLACEMENT  September 2010   Inferior STEMI: Proximal RCA 3.5 mm 23 mm vision BMS (4.0 mm)  . ERCP W/ METAL STENT PLACEMENT  02/16/2009  . GXT  07/2014   Target HR in Stage 3 - no CP. Limited by SOB. Up-sloping ST elevation. Non-ischemic  . myoview   03/15/2012    Social History   Social History  . Marital status: Married    Spouse name: N/A  . Number of children: N/A  . Years of education: N/A   Social History Main Topics  . Smoking status: Never Smoker  . Smokeless tobacco: Never Used  . Alcohol use No  . Drug use: No  . Sexual activity: Not Currently   Other Topics Concern  . None   Social  History Narrative   He is married, father of 69, grandfather of 38. Never smoked. Does not drink.    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    He does lots of lifting, pulling with ropes, using upper extremities.     ROS  Objective: Vitals:   09/08/16 0810 09/08/16 0817  BP:  139/77  Pulse:  67  Resp: 17 17  Temp:  98.4 F (36.9 C)  TempSrc:  Oral  SpO2:  95%  Weight:  215 lb (97.5 kg)  Height: 6' (1.829 m) 6' (1.829 m)    Physical Exam General: alert, oriented, in NAD Head: normocephalic, atraumatic, no sinus tenderness Eyes: EOM intact, no scleral icterus or conjunctival injection Ears: TM clear bilaterally Nose: mucosa nonerythematous, nonedematous Throat: no pharyngeal exudate or erythema Lymph: no posterior auricular, submental or cervical lymph adenopathy Heart: normal rate, normal sinus rhythm, no murmurs Lungs: clear to auscultation bilaterally, no wheezing Skin: discrete erythematous flat papular lesions on each thigh starting from the groin down each leg in 3 discrete symmetric spots    Assessment and Plan Chase Combs was seen today for cough and rash.  Diagnoses and all orders for this visit:  Dermatitis- trial of hydrocortisone Lesions are concerning for insect bite  Cough- discussed that since lungs are clear and his symptoms are mild would recommend treating with nasonex and allegra  Especially since he works outdoors and the cough started acutely over the weekend when the pollen count was extremely high  Seasonal allergic rhinitis due to pollen- nasonex and allegra daily  Other orders -     mometasone (NASONEX) 50 MCG/ACT nasal spray; Place 2 sprays into the nose daily. -     fexofenadine (ALLEGRA ALLERGY) 180 MG tablet; Take 1 tablet (180 mg total) by mouth daily. -     hydrocortisone 2.5 % lotion; Apply topically 2 (two) times daily.  Hypertension- continue current  bp medications Avoid any medications with [place]

## 2016-09-08 NOTE — Patient Instructions (Addendum)
If you skin rash is not any better let me know. If it is spreading and itching more at night it might be work trying permethrin shampoo as a one time treatment   IF you received an x-ray today, you will receive an invoice from [facility]. Please contact [facility] at [phone] with questions or concerns regarding your invoice.   IF you received labwork today, you will receive an invoice from [facility]. Please contact [facility] at [phone] with questions or concerns regarding your invoice.   Our billing staff will not be able to assist you with questions regarding bills from these companies.  You will be contacted with the lab results as soon as they are available. The fastest way to get your results is to activate your My Chart account. Instructions are located on the last page of this paperwork. If you have not heard from [place] regarding the results in 2 weeks, please contact this office.    Allergic Rhinitis Allergic rhinitis is when the mucous membranes in the nose respond to allergens. Allergens are particles in the air that cause your body to have an allergic reaction. This causes you to release allergic antibodies. Through a chain of events, these eventually cause you to release histamine into the blood stream. Although meant to protect the body, it is this release of histamine that causes your discomfort, such as frequent sneezing, congestion, and an itchy, runny nose. What are the causes? Seasonal allergic rhinitis (hay fever) is caused by pollen allergens that may come from grasses, trees, and weeds. Year-round allergic rhinitis (perennial allergic rhinitis) is caused by allergens such as house dust mites, pet dander, and mold spores. What are the signs or symptoms?  Nasal stuffiness (congestion).  Itchy, runny nose with sneezing and tearing of the eyes. How is this diagnosed? Your health care provider can help you determine the allergen or allergens that  trigger your symptoms. If you and your health care provider are unable to determine the allergen, skin or blood testing may be used. Your health care provider will diagnose your condition after taking your health history and performing a physical exam. Your health care provider may assess you for other related conditions, such as asthma, pink eye, or an ear infection. How is this treated? Allergic rhinitis does not have a cure, but it can be controlled by:  Medicines that block allergy symptoms. These may include allergy shots, nasal sprays, and oral antihistamines.  Avoiding the allergen. Hay fever may often be treated with antihistamines in pill or nasal spray forms. Antihistamines block the effects of histamine. There are over-the-counter medicines that may help with nasal congestion and swelling around the eyes. Check with your health care provider before taking or giving this medicine. If avoiding the allergen or the medicine prescribed do not work, there are many new medicines your health care provider can prescribe. Stronger medicine may be used if initial measures are ineffective. Desensitizing injections can be used if medicine and avoidance does not work. Desensitization is when a patient is given ongoing shots until the body becomes less sensitive to the allergen. Make sure you follow up with your health care provider if problems continue. Follow these instructions at home: It is not possible to completely avoid allergens, but you can reduce your symptoms by taking steps to limit your exposure to them. It helps to know exactly what you are allergic to so that you can avoid your specific triggers. Contact a health care provider if:  You have  a fever.  You develop a cough that does not stop easily (persistent).  You have shortness of breath.  You start wheezing.  Symptoms interfere with normal daily activities. This information is not intended to replace advice given to you by your  health care provider. Make sure you discuss any questions you have with your health care provider. Document Released: 02/03/2001 Document Revised: 01/10/2016 Document Reviewed: 01/16/2013 Elsevier Interactive Patient Education  2017 [employer].

## 2016-09-11 DIAGNOSIS — J069 Acute upper respiratory infection, unspecified: Secondary | ICD-10-CM | POA: Diagnosis not present

## 2016-09-16 ENCOUNTER — Other Ambulatory Visit: Payer: Self-pay | Admitting: Cardiology

## 2016-09-18 ENCOUNTER — Other Ambulatory Visit: Payer: Self-pay | Admitting: Cardiology

## 2016-09-21 ENCOUNTER — Other Ambulatory Visit: Payer: Self-pay | Admitting: Cardiology

## 2016-09-21 NOTE — Telephone Encounter (Signed)
Pt overdue for follow up appt, please call and schedule appt.

## 2016-09-24 ENCOUNTER — Ambulatory Visit (INDEPENDENT_AMBULATORY_CARE_PROVIDER_SITE_OTHER): Payer: Medicare Other | Admitting: Cardiology

## 2016-09-24 ENCOUNTER — Encounter: Payer: Self-pay | Admitting: Cardiology

## 2016-09-24 VITALS — BP 142/90 | HR 60 | Ht 72.0 in | Wt 206.4 lb

## 2016-09-24 DIAGNOSIS — I2119 ST elevation (STEMI) myocardial infarction involving other coronary artery of inferior wall: Secondary | ICD-10-CM | POA: Diagnosis not present

## 2016-09-24 DIAGNOSIS — I10 Essential (primary) hypertension: Secondary | ICD-10-CM | POA: Diagnosis not present

## 2016-09-24 DIAGNOSIS — I2510 Atherosclerotic heart disease of native coronary artery without angina pectoris: Secondary | ICD-10-CM | POA: Diagnosis not present

## 2016-09-24 DIAGNOSIS — E785 Hyperlipidemia, unspecified: Secondary | ICD-10-CM

## 2016-09-24 DIAGNOSIS — Z79899 Other long term (current) drug therapy: Secondary | ICD-10-CM

## 2016-09-24 DIAGNOSIS — Z9861 Coronary angioplasty status: Secondary | ICD-10-CM

## 2016-09-24 NOTE — Patient Instructions (Addendum)
PLEASE HAVE LABS DONE AT [place] 104 LIPIDS CMP DO NOT EAT OR DRINK THE MORNING OF THE TEST   NO OTHER CHANGES   Your physician wants you to follow-up in [place] DR HARDING. You will receive a reminder letter in the mail two months in advance. If you don't receive a letter, please call our office to schedule the follow-up appointment.   If you need a refill on your cardiac medications before your next appointment, please call your pharmacy.

## 2016-09-24 NOTE — Progress Notes (Signed)
PCP: Patient, No Pcp Per  Clinic Note: Chief Complaint  Patient presents with  . Follow-up    Pt.states no complaints  . Coronary Artery Disease    Status post PCI    HPI: Chase Combs is a 70 y.o. male with a PMH below who presents Combs for Delayed annual follow-up for CAD-inferior STEMI in 2006. CAD - .occluded RCA was treated with thrombectomy and 3.5 mm x 20 mm vision BMS stent placed and postdilated to 4 mm) along with a history of bare-metal stent placed to the radius intermedius in 2006. Last stress test was in October 2013 that showed no evidence of ischemia but evidence of an inferolateral & inferobasilar wall infarct. 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  Chase Combs was last seen on 07/18/2015  Recent Hospitalizations: None  Studies Personally Reviewed - (if available, images/films reviewed: From Epic Chart or Care Everywhere) - none  Interval History: Chase Combs overall doing quite well. He has no major complaints. He says he still remains active with his work. He does lots of heavy lifting and exertion. He does note that he can't get out and do the same things he was able to do several years ago, but denies any of his MI related anginal symptoms. He does have some exertional dyspnea if he gets out of overexerts himself, but not with routine activity. Often times, his hips full bother him more than breathing if he is trying to walk too fast.  Cardiac Review of Symptoms As Follows: No chest pain / pressure with rest or exertion.  May get a bit SOB with heavy exertion No PND, orthopnea or edema. No palpitations, lightheadedness, dizziness, weakness or syncope/near syncope. No TIA/amaurosis fugax symptoms. No melena, hematochezia, hematuria, or epstaxis. No claudication.  ROS: A comprehensive was performed. Review of Systems  Constitutional: Negative for malaise/fatigue (a little less Energy than a few years ago).  HENT:  Positive for congestion (sinus congestion). Negative for nosebleeds.   Eyes:       Needs glasses  Respiratory: Positive for cough (nagging cough from sinus drainage).   Cardiovascular: Negative for chest pain, palpitations, orthopnea, claudication, leg swelling and PND.  Gastrointestinal: Negative for blood in stool and melena.  Genitourinary: Negative for hematuria.  Musculoskeletal: Positive for joint pain (hips bother him if walking a lot (since pelvic fracture in 2010)).  Neurological: Negative for dizziness, focal weakness and headaches.  Endo/Heme/Allergies: Positive for environmental allergies. Bruises/bleeds easily.  Psychiatric/Behavioral: Negative for depression and memory loss. The patient is not nervous/anxious and does not have insomnia.   All other systems reviewed and are negative.  I have reviewed and (if needed) personally updated the patient's problem list, medications, allergies, past medical and surgical history, social and family history.   Past Medical History:  Diagnosis Date  . Arthritis   . CAD S/P percutaneous coronary angioplasty 06/18/2004; 02/16/2009, 02/2012   a) Angina: CoStar Study Stent 2.5 mm x 16 mm - prox RI; b) DES PCI RCA for Inf STEMI; c) TM Myoview: 7:20, 8 METS, Mild to mod scar with HK in basal-mid inferior and basal inferolateral wall.;  No ischemia; EF 51%  . Dyslipidemia   . Essential hypertension   . Hyperlipidemia with target LDL less than 70   . Multiple allergies   . S/p ST elevation myocardial infarction (STEMI) of inferior wall 02/16/2009   Occluded RCA --> PCI after thrombectomy 3.5 mm x 20 mm Vision  BMs as (4.0 mm)    Past Surgical History:  Procedure Laterality Date  . CAROTID STENT  01/2009  . CORONARY ANGIOPLASTY WITH STENT PLACEMENT  January 2006   PCI of the proximal Ramus Intermedius -- CoStar study stent 2.5 mm x 16 mm  . CORONARY ANGIOPLASTY WITH STENT PLACEMENT  September 2010   Inferior STEMI: Proximal RCA 3.5 mm 23 mm  vision BMS (4.0 mm)  . ERCP W/ METAL STENT PLACEMENT  02/16/2009  . GXT  07/2014   Target HR in Stage 3 - no CP. Limited by SOB. Up-sloping ST elevation. Non-ischemic  . NM MYOVIEW LTD  03/15/2012   7:20, 8 METS, Mild to mod scar with HK in basal-mid inferior and basal inferolateral wall.;  No ischemia; EF 51%    Current Meds  Medication Sig  . amLODipine (NORVASC) 10 MG tablet take 1 tablet by mouth once daily  . aspirin 325 MG tablet Take 325 mg by mouth daily.  . clopidogrel (PLAVIX) 75 MG tablet take 1 tablet by mouth once daily  . hydrochlorothiazide (HYDRODIURIL) 25 MG tablet take 1 tablet by mouth once daily  . hydrocortisone 2.5 % lotion Apply topically 2 (two) times daily.  . metoprolol tartrate (LOPRESSOR) 25 MG tablet take 1 tablet by mouth twice a day  . Multiple Vitamin (MULTIVITAMIN) tablet Take 1 tablet by mouth daily.  . rosuvastatin (CRESTOR) 20 MG tablet take 1 tablet by mouth once daily    Allergies  Allergen Reactions  . Codeine Nausea And Vomiting    Social History   Social History  . Marital status: Married    Spouse name: N/A  . Number of children: N/A  . Years of education: N/A   Social History Main Topics  . Smoking status: Never Smoker  . Smokeless tobacco: Never Used  . Alcohol use No  . Drug use: No  . Sexual activity: Not Currently   Other Topics Concern  . None   Social History Narrative   He is married, father of 32, grandfather of 38. Never smoked. Does not drink.    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    He does lots of lifting, pulling with ropes, using upper extremities.     family history includes Cancer in his father and mother.  Wt Readings from Last 3 Encounters:  09/24/16 206 lb 6.4 oz (93.6 kg)  09/08/16 215 lb (97.5 kg)  07/18/15 204 lb 4.8 oz (92.7 kg)    PHYSICAL EXAM BP (!) 142/90   Pulse 60   Ht 6' (1.829 m)   Wt 206 lb  6.4 oz (93.6 kg)   BMI 27.99 kg/m  General appearance: alert, cooperative, appears stated age, no distress and healthy appearing.  Well groomed Neck: no adenopathy, no carotid bruit and no JVD Lungs: clear to auscultation bilaterally, normal percussion bilaterally and non-labored Heart: regular rate and rhythm, S1 & S2 normal, no murmur, click, rub or gallop; non-displaced PMI Abdomen: soft, non-tender; bowel sounds normal; no masses,  no organomegaly; truncal obesity Extremities: extremities normal, atraumatic, no cyanosis, or edema  Pulses: 2+ and symmetric;  Skin: mobility and turgor normal, no edema, no evidence of bleeding or bruising, no lesions noted, temperature normal and texture normal  Neurologic: Mental status: Alert, oriented, thought content appropriate; pleasant mood & affect   Adult ECG Report n/a  Other studies Reviewed: Additional studies/ records  that were reviewed Combs include:  Recent Labs:   Lab Results  Component Value Date   CHOL 118 06/28/2014   HDL 36 (L) 06/28/2014   LDLCALC 67 06/28/2014   TRIG 75 06/28/2014   CHOLHDL 3.3 06/28/2014     ASSESSMENT / PLAN: Problem List Items Addressed This Visit    CAD S/P percutaneous coronary angioplasty (Chronic)    He has had PCI to both the ramus intermedius as well as the RCA. I'm not sure what type of stent the costar study stent was, but the RCA stent is bare-metal.  No further angina. He continues to be on aspirin and Plavix. He has been on 325 aspirin which we have tried to cut down.  He is on statin as well as beta blocker and amlodipine with no anginal symptoms.  Plan: DC aspirin and continue other medications.       Relevant Orders   Comprehensive metabolic panel   Dyslipidemia, goal LDL below 70 - Primary (Chronic)    I don't have any recent labs since February 2016. At that time his labs look good. He is on stable dose of rosuvastatin without any adverse symptoms.  Plan: Continue statin and check  lipid panel --> further adjustments based on results.      Relevant Orders   Lipid panel   Comprehensive metabolic panel   Essential hypertension (Chronic)    His blood pressure is borderline Combs. But his PCPs office was normal just recently. He is on relatively low-dose of beta blocker and HCTZ. He is also on max dose of amlodipine.  Plan for now will be to monitor his pressures through his PCP indeterminate if there is actually a trend toward higher pressures. If so we would consider either an ACE inhibitor or ARB.      S/p ST elevation myocardial infarction (STEMI) of inferior wall (September 2010) (Chronic)    No recurrent symptoms of angina or heart failure. Nuclear stress test showed slightly reduced EF with basal to mid inferior and inferolateral infarct. No ischemia.  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 His last GXT was in 2016       Other Visit Diagnoses    Medication management       Relevant Orders   Comprehensive metabolic panel      Current medicines are reviewed at length with the patient Combs. (+/- concerns) n/a The following changes have been made: n/a  Patient Instructions  [place] 104 LIPIDS CMP DO NOT EAT OR DRINK THE MORNING OF THE TEST   NO OTHER CHANGES   Your physician wants you to follow-up in 12 MONTHS WITH DR HARDING. You will receive a reminder letter in the mail two months in advance. If you don't receive a letter, please call our office to schedule the follow-up appointment.   If you need a refill on your cardiac medications before your next appointment, please call your pharmacy.    Studies Ordered:   Orders Placed This Encounter  Procedures  . Lipid panel  . Comprehensive metabolic panel      Glenetta Hew, M.D., M.S. Interventional Cardiologist   Pager # [phone] Phone # [phone] [address] [place], [place] [address]

## 2016-09-26 ENCOUNTER — Encounter: Payer: Self-pay | Admitting: Cardiology

## 2016-09-26 NOTE — Assessment & Plan Note (Addendum)
He has had PCI to both the ramus intermedius as well as the RCA. I'm not sure what type of stent the costar study stent was, but the RCA stent is bare-metal.  No further angina. He continues to be on aspirin and Plavix. He has been on 325 aspirin which we have tried to cut down.  He is on statin as well as beta blocker and amlodipine with no anginal symptoms.  Plan: DC aspirin and continue other medications.

## 2016-09-26 NOTE — Assessment & Plan Note (Signed)
I don't have any recent labs since February 2016. At that time his labs look good. He is on stable dose of rosuvastatin without any adverse symptoms.  Plan: Continue statin and check lipid panel --> further adjustments based on results.

## 2016-09-26 NOTE — Assessment & Plan Note (Signed)
His blood pressure is borderline today. But his PCPs office was normal just recently. He is on relatively low-dose of beta blocker and HCTZ. He is also on max dose of amlodipine.  Plan for now will be to monitor his pressures through his PCP indeterminate if there is actually a trend toward higher pressures. If so we would consider either an ACE inhibitor or ARB.

## 2016-09-26 NOTE — Assessment & Plan Note (Signed)
No recurrent symptoms of angina or heart failure. Nuclear stress test showed slightly reduced EF with basal to mid inferior and inferolateral infarct. No ischemia.  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 His last GXT was in 2016

## 2016-10-06 ENCOUNTER — Other Ambulatory Visit: Payer: Self-pay | Admitting: Cardiology

## 2016-10-14 ENCOUNTER — Other Ambulatory Visit: Payer: Self-pay | Admitting: Cardiology

## 2016-10-14 NOTE — Telephone Encounter (Signed)
REFILL 

## 2016-10-29 DIAGNOSIS — I2510 Atherosclerotic heart disease of native coronary artery without angina pectoris: Secondary | ICD-10-CM | POA: Diagnosis not present

## 2016-10-29 DIAGNOSIS — Z79899 Other long term (current) drug therapy: Secondary | ICD-10-CM | POA: Diagnosis not present

## 2016-10-29 DIAGNOSIS — Z9861 Coronary angioplasty status: Secondary | ICD-10-CM | POA: Diagnosis not present

## 2016-10-29 DIAGNOSIS — E785 Hyperlipidemia, unspecified: Secondary | ICD-10-CM | POA: Diagnosis not present

## 2016-10-30 LAB — LIPID PANEL
Chol/HDL Ratio: 3 ratio (ref 0.0–5.0)
Cholesterol, Total: 114 mg/dL (ref 100–199)
HDL: 38 mg/dL — ABNORMAL LOW (ref >39)
LDL Calculated: 63 mg/dL (ref 0–99)
Triglycerides: 65 mg/dL (ref 0–149)
VLDL Cholesterol Cal: 13 mg/dL (ref 5–40)

## 2016-10-30 LAB — COMPREHENSIVE METABOLIC PANEL
ALBUMIN: 4.2 g/dL (ref 3.6–4.8)
ALT: 44 IU/L (ref 0–44)
AST: 27 IU/L (ref 0–40)
Albumin/Globulin Ratio: 1.8 (ref 1.2–2.2)
Alkaline Phosphatase: 71 IU/L (ref 39–117)
BUN/Creatinine Ratio: 27 — ABNORMAL HIGH (ref 10–24)
BUN: 20 mg/dL (ref 8–27)
Bilirubin Total: 0.5 mg/dL (ref 0.0–1.2)
CALCIUM: 9.1 mg/dL (ref 8.6–10.2)
CO2: 23 mmol/L (ref 18–29)
Chloride: 102 mmol/L (ref 96–106)
Creatinine, Ser: 0.75 mg/dL — ABNORMAL LOW (ref 0.76–1.27)
GFR calc Af Amer: 108 mL/min/{1.73_m2} (ref >59)
GFR, EST NON AFRICAN AMERICAN: 94 mL/min/{1.73_m2} (ref >59)
Globulin, Total: 2.4 g/dL (ref 1.5–4.5)
Glucose: 109 mg/dL — ABNORMAL HIGH (ref 65–99)
Potassium: 4.1 mmol/L (ref 3.5–5.2)
Sodium: 141 mmol/L (ref 134–144)
Total Protein: 6.6 g/dL (ref 6.0–8.5)

## 2016-11-23 ENCOUNTER — Telehealth: Payer: Self-pay | Admitting: *Deleted

## 2016-11-23 NOTE — Telephone Encounter (Signed)
-----   Message from Leonie Man, MD sent at 11/19/2016  8:05 PM EDT ----- Lab results look good. Liver and kidney function are stable along with electrolytes/salts Cholesterol panel looks good also. Stable from 2 years ago: Total cholesterol 114, triglycerides 65, HDL 38 and LDL of 63. All in goal. Continue current dose of Crestor.  Glenetta Hew, MD

## 2016-11-23 NOTE — Telephone Encounter (Signed)
Spoke to patien'ts wife. Result given . Verbalized understanding  

## 2016-11-27 ENCOUNTER — Other Ambulatory Visit: Payer: Self-pay | Admitting: Cardiology

## 2017-01-11 ENCOUNTER — Other Ambulatory Visit: Payer: Self-pay | Admitting: Cardiology

## 2017-02-21 ENCOUNTER — Other Ambulatory Visit: Payer: Self-pay | Admitting: Cardiology

## 2017-02-22 NOTE — Telephone Encounter (Signed)
REFILL 

## 2017-03-12 ENCOUNTER — Other Ambulatory Visit: Payer: Self-pay | Admitting: Cardiology

## 2017-03-16 DIAGNOSIS — S0502XA Injury of conjunctiva and corneal abrasion without foreign body, left eye, initial encounter: Secondary | ICD-10-CM | POA: Diagnosis not present

## 2017-04-30 DIAGNOSIS — R972 Elevated prostate specific antigen [PSA]: Secondary | ICD-10-CM | POA: Diagnosis not present

## 2017-04-30 DIAGNOSIS — N401 Enlarged prostate with lower urinary tract symptoms: Secondary | ICD-10-CM | POA: Diagnosis not present

## 2017-05-29 DIAGNOSIS — S61212A Laceration without foreign body of right middle finger without damage to nail, initial encounter: Secondary | ICD-10-CM | POA: Diagnosis not present

## 2017-05-29 DIAGNOSIS — S61210A Laceration without foreign body of right index finger without damage to nail, initial encounter: Secondary | ICD-10-CM | POA: Diagnosis not present

## 2017-06-24 ENCOUNTER — Other Ambulatory Visit: Payer: Self-pay | Admitting: Cardiology

## 2017-07-09 ENCOUNTER — Other Ambulatory Visit: Payer: Self-pay | Admitting: Cardiology

## 2017-07-09 NOTE — Telephone Encounter (Signed)
REFILL 

## 2017-09-23 ENCOUNTER — Other Ambulatory Visit: Payer: Self-pay | Admitting: Cardiology

## 2017-09-23 NOTE — Telephone Encounter (Signed)
REFILL 

## 2017-09-24 ENCOUNTER — Encounter: Payer: Self-pay | Admitting: Cardiology

## 2017-09-24 ENCOUNTER — Ambulatory Visit: Payer: Medicare Other | Admitting: Cardiology

## 2017-09-24 VITALS — BP 132/72 | HR 68 | Ht 72.0 in | Wt 209.6 lb

## 2017-09-24 DIAGNOSIS — Z9861 Coronary angioplasty status: Secondary | ICD-10-CM | POA: Diagnosis not present

## 2017-09-24 DIAGNOSIS — I10 Essential (primary) hypertension: Secondary | ICD-10-CM | POA: Diagnosis not present

## 2017-09-24 DIAGNOSIS — R5383 Other fatigue: Secondary | ICD-10-CM

## 2017-09-24 DIAGNOSIS — E785 Hyperlipidemia, unspecified: Secondary | ICD-10-CM

## 2017-09-24 DIAGNOSIS — I2510 Atherosclerotic heart disease of native coronary artery without angina pectoris: Secondary | ICD-10-CM

## 2017-09-24 DIAGNOSIS — I2119 ST elevation (STEMI) myocardial infarction involving other coronary artery of inferior wall: Secondary | ICD-10-CM

## 2017-09-24 DIAGNOSIS — Z0289 Encounter for other administrative examinations: Secondary | ICD-10-CM | POA: Insufficient documentation

## 2017-09-24 MED ORDER — ASPIRIN EC 81 MG PO TBEC: 81.0000 mg | DELAYED_RELEASE_TABLET | Freq: Every day | ORAL | 3 refills | Status: DC

## 2017-09-24 MED ORDER — METOPROLOL SUCCINATE ER 25 MG PO TB24: 25.0000 mg | ORAL_TABLET | Freq: Every day | ORAL | 3 refills | Status: DC

## 2017-09-24 NOTE — Progress Notes (Signed)
PCP: Patient, No Pcp Per  Clinic Note: Chief Complaint  Patient presents with  . Follow-up    annual  . Coronary Artery Disease    no Angina    HPI: Chase Combs is a 72 y.o. male with a PMH below who presents today for routine annual follow-up for CAD-inferior STEMI back in 2006.Marland Kitchen   CAD ->  history BMS PCI of RI w/ Co-Star BMS (2.5 x 16)  in 2006 - Dr. Melvern Banker.   Inf STEMI 01/2009 -->  occluded RCA was treated with thrombectomy and 3.5 mm x 20 mm vision BMS stent placed and postdilated to 4 mm) (Dr. Angelena Form)  Last stress test was in October 2013 that showed no evidence of ischemia but evidence of an inferolateral & inferobasilar wall infarct.  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  Chase Combs was last seen on 09/29/2016 -he was doing quite well at that time without any major cardiac complaints.  Just noted that he "tires out easily than he used to".  Recent Hospitalizations: n/a  Studies Personally Reviewed - (if available, images/films reviewed: From Epic Chart or Care Everywhere)  n/a  Interval History: Chase Combs returns today overall doing quite well.  He really denies any cardiac symptoms to speak of.  He occasionally has some indigestion and gas type symptoms, and if really pushes it, he may notice some exertional dyspnea.  He states that he does not have as much energy as he used to have.  I am in the day working, he is ready to stop now (of course he does still work a 14-hour workday).  He used to be able to go on until 9-10 PM, but now is ready to stop at 5-6 PM.  Otherwise he is essentially negative cardiac review of symptoms as follows: No chest pain or shortness of breath with rest or exertion. No PND, orthopnea or edema. No palpitations, lightheadedness, dizziness, weakness or syncope/near syncope. No TIA/amaurosis fugax symptoms.  No claudication.  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  ROS: A comprehensive was performed. Review of Systems  Constitutional: Negative for chills, fever and malaise/fatigue (Does not have much energy as he had 10 years ago).  HENT: Negative for congestion and nosebleeds.   Eyes: Negative.   Respiratory: Negative for cough, shortness of breath and wheezing.   Cardiovascular:       Per HPI  Gastrointestinal: Positive for heartburn. Negative for abdominal pain, blood in stool and melena.  Genitourinary: Negative for hematuria.  Musculoskeletal: Positive for back pain and joint pain (Hips and knees). Negative for falls.  Neurological: Negative for dizziness, focal weakness and weakness.  Endo/Heme/Allergies: Negative for environmental allergies. Does not bruise/bleed easily.  All other systems reviewed and are negative.   I have reviewed and (if needed) personally updated the patient's problem list, medications, allergies, past medical and surgical history, social and family history.   Past Medical History:  Diagnosis Date  . Arthritis   . CAD S/P percutaneous coronary angioplasty 06/18/2004; 02/16/2009, 02/2012   a) Angina: CoStar Study Stent 2.5 mm x 16 mm - prox RI; b) DES PCI RCA for Inf STEMI; c) TM Myoview: 7:20, 8 METS, Mild to mod scar with HK in basal-mid inferior and basal inferolateral wall.;  No ischemia;  EF 51%  . Dyslipidemia   . Essential hypertension   . Hyperlipidemia with target LDL less than 70   . Multiple allergies   . S/p ST elevation myocardial infarction (STEMI) of inferior wall 02/16/2009   Occluded RCA --> PCI after thrombectomy 3.5 mm x 20 mm Vision BMs as (4.0 mm)    Past Surgical History:  Procedure Laterality Date  . CAROTID STENT  01/2009  . CORONARY ANGIOPLASTY WITH STENT PLACEMENT  January 2006   PCI of the proximal Ramus Intermedius -- CoStar study stent 2.5 mm x 16 mm  . CORONARY ANGIOPLASTY WITH STENT PLACEMENT   September 2010   Inferior STEMI: Proximal RCA 3.5 mm 23 mm vision BMS (4.0 mm)  . ERCP W/ METAL STENT PLACEMENT  02/16/2009  . GXT  07/2014   Target HR in Stage 3 - no CP. Limited by SOB. Up-sloping ST elevation. Non-ischemic  . NM MYOVIEW LTD  03/15/2012   7:20, 8 METS, Mild to mod scar with HK in basal-mid inferior and basal inferolateral wall.;  No ischemia; EF 51%    Current Meds  Medication Sig  . amLODipine (NORVASC) 10 MG tablet take 1 tablet by mouth once daily  . finasteride (PROSCAR) 5 MG tablet Take 1 tablet by mouth daily.  . hydrochlorothiazide (HYDRODIURIL) 25 MG tablet take 1 tablet by mouth once daily  . hydrocortisone 2.5 % lotion Apply topically 2 (two) times daily.  . Multiple Vitamin (MULTIVITAMIN) tablet Take 1 tablet by mouth daily.  . rosuvastatin (CRESTOR) 20 MG tablet Take 1 tablet (20 mg total) by mouth daily. KEEP OV.  . tamsulosin (FLOMAX) 0.4 MG CAPS capsule Take 1 capsule by mouth daily.  . valsartan (DIOVAN) 320 MG tablet take 1 tablet by mouth once daily (KEEP APPOINTMENT FOR REFILLS)  . [DISCONTINUED] aspirin 325 MG tablet Take 325 mg by mouth daily.  . [DISCONTINUED] clopidogrel (PLAVIX) 75 MG tablet take 1 tablet by mouth once daily  . [DISCONTINUED] metoprolol tartrate (LOPRESSOR) 25 MG tablet take 1 tablet by mouth twice a day    Allergies  Allergen Reactions  . Codeine Nausea And Vomiting    Social History   Tobacco Use  . Smoking status: Never Smoker  . Smokeless tobacco: Never Used  Substance Use Topics  . Alcohol use: No    Alcohol/week: 0.0 oz  . Drug use: No   Social History   Social History Narrative   He is married, father of 38, grandfather of 56. Never smoked. Does not drink.    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    He does lots of lifting, pulling with ropes, using upper extremities.     family history includes Cancer in his father  and mother.  Wt Readings from Last 3 Encounters:  09/24/17 209 lb 9.6 oz (95.1 kg)  09/24/16 206 lb 6.4 oz (93.6 kg)  09/08/16 215 lb (97.5 kg)    PHYSICAL EXAM BP 132/72   Pulse 68   Ht 6' (1.829 m)   Wt 209 lb 9.6 oz (95.1 kg)   BMI 28.43 kg/m  Physical Exam  Constitutional: He is oriented to person, place, and time. He appears well-developed and well-nourished. No distress.  Healthy-appearing.  Well-groomed  HENT:  Head: Normocephalic and atraumatic.  Mouth/Throat: No oropharyngeal exudate.  Eyes: EOM are normal.  Neck: Normal range of motion. Neck supple. No hepatojugular reflux  and no JVD present. Carotid bruit is not present. No thyromegaly present.  Cardiovascular: Normal rate, regular rhythm, normal heart sounds, intact distal pulses and normal pulses.  No extrasystoles are present. PMI is not displaced. Exam reveals no gallop and no friction rub.  No murmur heard. Pulmonary/Chest: Effort normal and breath sounds normal. No respiratory distress. He has no wheezes. He has no rales.  Abdominal: Soft. Bowel sounds are normal. He exhibits no distension. There is no tenderness. There is no rebound.  No HSM  Musculoskeletal: Normal range of motion. He exhibits no edema (Trivial).  Neurological: He is alert and oriented to person, place, and time.  Psychiatric: He has a normal mood and affect. His behavior is normal. Judgment and thought content normal.  Vitals reviewed.    Adult ECG Report  Rate: 68 ;  Rhythm: normal sinus rhythm, sinus arrhythmia and Left axis deviation (-36 deg). IVCD.  ; Otherwise normal intervals and durations  Narrative Interpretation: Stable   Other studies Reviewed: Additional studies/ records that were reviewed today include:  Recent Labs:  Lab Results  Component Value Date   CHOL 114 10/29/2016   HDL 38 (L) 10/29/2016   LDLCALC 63 10/29/2016   TRIG 65 10/29/2016   CHOLHDL 3.0 10/29/2016   Lab Results  Component Value Date   CREATININE  0.75 (L) 10/29/2016   BUN 20 10/29/2016   NA 141 10/29/2016   K 4.1 10/29/2016   CL 102 10/29/2016   CO2 23 10/29/2016    ASSESSMENT / PLAN: Problem List Items Addressed This Visit    S/p ST elevation myocardial infarction (STEMI) of inferior wall (September 2010) - Primary (Chronic)    History of inferior MI with evidence of infarct noted on Myoview.  However there is no ischemia on Myoview, and therefore is not had any further evaluation.  He did have a GXT in 2016 which was negative for ischemia. 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 In the absence of any ongoing symptoms, I do not see any reason to do any further stress test.      Relevant Medications   aspirin EC 81 MG tablet   metoprolol succinate (TOPROL XL) 25 MG 24 hr tablet   Other Relevant Orders   EKG 12-Lead (Completed)   Comprehensive metabolic panel   Fatigue due to treatment    Switch Crestor to p.m.  Also switch beta-blocker to Toprol 25 mg nightly. Check CBC & TSH.       Relevant Orders   EKG 12-Lead (Completed)   CBC   TSH   Essential hypertension (Chronic)    Well-controlled with current meds.  He is on max dose amlodipine and Diovan as well as HCTZ with low-dose beta-blocker.      Relevant Medications   aspirin EC 81 MG tablet   metoprolol succinate (TOPROL XL) 25 MG 24 hr tablet   Dyslipidemia, goal LDL below 70 (Chronic)    Have not seen labs in some time now.  He remains on Crestor 20 mg daily which I did recommend that he take in the evening to avoid fatigue.  Plan: Check labs, continue current dose of Crestor  until we see results..      Relevant Medications   aspirin EC 81 MG tablet   metoprolol succinate (TOPROL XL) 25 MG 24 hr tablet   Other Relevant Orders   Lipid panel   Comprehensive metabolic panel   CAD S/P percutaneous coronary  angioplasty (Chronic)    Basically has had BMS stents to both the ramus intermedius and RCA with negative  ischemic evaluations in follow-up.  He remains on aspirin and Plavix which I think is probably more than he needs.  He can probably stop the Plavix and go to baby aspirin 81 mg. I recommend that he switch taking Crestor to the evening since he is noticing a lot of fatigue during the day.  I will also switch his beta-blocker to once daily Toprol 25 mg because he is actually taking 1 tablet of metoprolol tartrate 25 mg daily.  Otherwise he is also on ARB and amlodipine with no active angina.      Relevant Medications   aspirin EC 81 MG tablet   metoprolol succinate (TOPROL XL) 25 MG 24 hr tablet   Other Relevant Orders   EKG 12-Lead (Completed)   Comprehensive metabolic panel   CBC   TSH       I spent a total of 25 minutes with the patient and chart review. >  50% of the time was spent in direct patient consultation.   Current medicines are reviewed at length with the patient today.  (+/- concerns) n/a The following changes have been made:  n/a   Patient Instructions  MEDICATION  INSTRUCTIONS  STOP CLOPIDOGREL  STOP METOPROLOL TARTRATE  TWICE A DAY   START ASPIRIN 81 MG ONE TABELT A DAY  START TOPROL XL  ( METOPROLOL SUCCINATE) 25 MG ON TABLET AT BEDTIME    AND TAKE CRESTOR ( ROSUVASTATIN) AT BEDTIME    LABS   CMP  CBC  TSH LIPID     Your physician wants you to follow-up in [place] DR Kaely Hollan. You will receive a reminder letter in the mail two months in advance. If you don't receive a letter, please call our office to schedule the follow-up appointment.   If you need a refill on your cardiac medications before your next appointment, please call your pharmacy.    Studies Ordered:   Orders Placed This Encounter  Procedures  . Lipid panel  . Comprehensive metabolic panel  . CBC  . TSH  . EKG 12-Lead      Glenetta Hew, M.D., M.S. Interventional Cardiologist   Pager # [phone] Phone # [phone] [address] [place], [place] [address]   Thank you for choosing [facility] at [facility]!!

## 2017-09-24 NOTE — Patient Instructions (Addendum)
MEDICATION  INSTRUCTIONS  STOP CLOPIDOGREL  STOP METOPROLOL TARTRATE  TWICE A DAY   START ASPIRIN 81 MG ONE TABELT A DAY  START TOPROL XL  ( METOPROLOL SUCCINATE) 25 MG ON TABLET AT BEDTIME    AND TAKE CRESTOR ( ROSUVASTATIN) AT BEDTIME    LABS   CMP  CBC  TSH LIPID     Your physician wants you to follow-up in [place] DR HARDING. You will receive a reminder letter in the mail two months in advance. If you don't receive a letter, please call our office to schedule the follow-up appointment.   If you need a refill on your cardiac medications before your next appointment, please call your pharmacy.

## 2017-09-26 ENCOUNTER — Encounter: Payer: Self-pay | Admitting: Cardiology

## 2017-09-26 NOTE — Assessment & Plan Note (Signed)
History of inferior MI with evidence of infarct noted on Myoview.  However there is no ischemia on Myoview, and therefore is not had any further evaluation.  He did have a GXT in 2016 which was negative for ischemia. 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 In the absence of any ongoing symptoms, I do not see any reason to do any further stress test.

## 2017-09-26 NOTE — Assessment & Plan Note (Signed)
Have not seen labs in some time now.  He remains on Crestor 20 mg daily which I did recommend that he take in the evening to avoid fatigue.  Plan: Check labs, continue current dose of Crestor  until we see results.Chase Combs

## 2017-09-26 NOTE — Assessment & Plan Note (Signed)
Well-controlled with current meds.  He is on max dose amlodipine and Diovan as well as HCTZ with low-dose beta-blocker.

## 2017-09-26 NOTE — Assessment & Plan Note (Addendum)
Switch Crestor to p.m.  Also switch beta-blocker to Toprol 25 mg nightly. Check CBC & TSH.

## 2017-09-26 NOTE — Assessment & Plan Note (Signed)
Basically has had BMS stents to both the ramus intermedius and RCA with negative ischemic evaluations in follow-up.  He remains on aspirin and Plavix which I think is probably more than he needs.  He can probably stop the Plavix and go to baby aspirin 81 mg. I recommend that he switch taking Crestor to the evening since he is noticing a lot of fatigue during the day.  I will also switch his beta-blocker to once daily Toprol 25 mg because he is actually taking 1 tablet of metoprolol tartrate 25 mg daily.  Otherwise he is also on ARB and amlodipine with no active angina.

## 2017-10-22 ENCOUNTER — Other Ambulatory Visit: Payer: Self-pay | Admitting: Cardiology

## 2017-10-22 NOTE — Telephone Encounter (Signed)
Rx sent to pharmacy   

## 2017-11-12 DIAGNOSIS — H25813 Combined forms of age-related cataract, bilateral: Secondary | ICD-10-CM | POA: Diagnosis not present

## 2017-11-22 ENCOUNTER — Other Ambulatory Visit: Payer: Self-pay | Admitting: Cardiology

## 2017-11-22 NOTE — Telephone Encounter (Signed)
Rx(s) sent to pharmacy electronically.  

## 2017-12-03 DIAGNOSIS — R5383 Other fatigue: Secondary | ICD-10-CM | POA: Diagnosis not present

## 2017-12-03 DIAGNOSIS — I2510 Atherosclerotic heart disease of native coronary artery without angina pectoris: Secondary | ICD-10-CM | POA: Diagnosis not present

## 2017-12-03 DIAGNOSIS — I2119 ST elevation (STEMI) myocardial infarction involving other coronary artery of inferior wall: Secondary | ICD-10-CM | POA: Diagnosis not present

## 2017-12-03 DIAGNOSIS — E785 Hyperlipidemia, unspecified: Secondary | ICD-10-CM | POA: Diagnosis not present

## 2017-12-03 DIAGNOSIS — Z9861 Coronary angioplasty status: Secondary | ICD-10-CM | POA: Diagnosis not present

## 2017-12-03 LAB — COMPREHENSIVE METABOLIC PANEL
A/G RATIO: 1.8 (ref 1.2–2.2)
ALT: 25 IU/L (ref 0–44)
AST: 24 IU/L (ref 0–40)
Albumin: 4.4 g/dL (ref 3.5–4.8)
Alkaline Phosphatase: 75 IU/L (ref 39–117)
BUN/Creatinine Ratio: 30 — ABNORMAL HIGH (ref 10–24)
BUN: 21 mg/dL (ref 8–27)
Bilirubin Total: 0.4 mg/dL (ref 0.0–1.2)
CALCIUM: 9.5 mg/dL (ref 8.6–10.2)
CO2: 25 mmol/L (ref 20–29)
CREATININE: 0.69 mg/dL — AB (ref 0.76–1.27)
Chloride: 100 mmol/L (ref 96–106)
GFR, EST AFRICAN AMERICAN: 111 mL/min/{1.73_m2} (ref >59)
GFR, EST NON AFRICAN AMERICAN: 96 mL/min/{1.73_m2} (ref >59)
Globulin, Total: 2.5 g/dL (ref 1.5–4.5)
Glucose: 104 mg/dL — ABNORMAL HIGH (ref 65–99)
Potassium: 4.3 mmol/L (ref 3.5–5.2)
Sodium: 141 mmol/L (ref 134–144)
TOTAL PROTEIN: 6.9 g/dL (ref 6.0–8.5)

## 2017-12-03 LAB — LIPID PANEL
CHOL/HDL RATIO: 3.3 ratio (ref 0.0–5.0)
Cholesterol, Total: 117 mg/dL (ref 100–199)
HDL: 36 mg/dL — ABNORMAL LOW (ref >39)
LDL Calculated: 65 mg/dL (ref 0–99)
TRIGLYCERIDES: 80 mg/dL (ref 0–149)
VLDL Cholesterol Cal: 16 mg/dL (ref 5–40)

## 2017-12-03 LAB — CBC
HEMOGLOBIN: 15.1 g/dL (ref 13.0–17.7)
Hematocrit: 43.9 % (ref 37.5–51.0)
MCH: 31.3 pg (ref 26.6–33.0)
MCHC: 34.4 g/dL (ref 31.5–35.7)
MCV: 91 fL (ref 79–97)
Platelets: 337 10*3/uL (ref 150–450)
RBC: 4.83 x10E6/uL (ref 4.14–5.80)
RDW: 13.9 % (ref 12.3–15.4)
WBC: 7.1 10*3/uL (ref 3.4–10.8)

## 2017-12-03 LAB — TSH: TSH: 2.71 u[IU]/mL (ref 0.450–4.500)

## 2017-12-06 ENCOUNTER — Encounter: Payer: Self-pay | Admitting: *Deleted

## 2018-01-09 ENCOUNTER — Other Ambulatory Visit: Payer: Self-pay | Admitting: Cardiology

## 2018-01-10 NOTE — Telephone Encounter (Signed)
Rx sent to pharmacy   

## 2018-01-11 ENCOUNTER — Telehealth: Payer: Self-pay | Admitting: Cardiology

## 2018-01-11 MED ORDER — HYDROCHLOROTHIAZIDE 25 MG PO TABS: 25.0000 mg | ORAL_TABLET | Freq: Every day | ORAL | 3 refills | Status: DC

## 2018-01-11 NOTE — Telephone Encounter (Signed)
Spoke with pt's wife who states a refill request for HCTZ was denied. Inform that per last OV on 09/24/17 but is to continue taking medication. New prescription sent over to pharmacy.

## 2018-01-11 NOTE — Telephone Encounter (Signed)
New Message       Pt c/o medication issue:  1. Name of Medication: hydrochlorothiazide (HYDRODIURIL) 25 MG tablet  2. How are you currently taking this medication (dosage and times per day)? Once a day  3. Are you having a reaction (difficulty breathing--STAT)? No   4. What is your medication issue? Medication was denied

## 2018-03-04 ENCOUNTER — Other Ambulatory Visit: Payer: Self-pay | Admitting: Cardiology

## 2018-03-04 MED ORDER — VALSARTAN 320 MG PO TABS: ORAL_TABLET | ORAL | 1 refills | Status: DC

## 2018-03-04 NOTE — Telephone Encounter (Signed)
Spoke with patient's wife, Baker Janus, who is requesting a refill of valsartan 320 mg daily be sent to [facility] in [place]. Refill sent.

## 2018-03-04 NOTE — Telephone Encounter (Signed)
Pt c/o medication issue:  1. Name of Medication: Valsartan-Called Pharmacist and they her to contact the office  2. How are you currently taking this medication (dosage and times per day)?  3. Are you having a reaction (difficulty breathing--STAT)?  4. What is your medication issue?

## 2018-04-08 ENCOUNTER — Other Ambulatory Visit: Payer: Self-pay | Admitting: Cardiology

## 2018-04-18 ENCOUNTER — Other Ambulatory Visit: Payer: Self-pay | Admitting: Cardiology

## 2018-07-08 ENCOUNTER — Other Ambulatory Visit: Payer: Self-pay | Admitting: Cardiology

## 2018-08-30 ENCOUNTER — Other Ambulatory Visit: Payer: Self-pay | Admitting: Cardiology

## 2018-08-30 NOTE — Telephone Encounter (Signed)
Valsartan refilled.

## 2018-09-11 ENCOUNTER — Other Ambulatory Visit: Payer: Self-pay | Admitting: Cardiology

## 2018-09-12 NOTE — Telephone Encounter (Signed)
Rosuvastatin 20 mg refilled  

## 2018-09-18 ENCOUNTER — Other Ambulatory Visit: Payer: Self-pay | Admitting: Cardiology

## 2018-09-19 NOTE — Telephone Encounter (Signed)
Metoprolol succ 25 mg refilled. 

## 2018-09-20 ENCOUNTER — Telehealth: Payer: Self-pay | Admitting: *Deleted

## 2018-09-20 NOTE — Telephone Encounter (Signed)
09/20/18 LMOM @ 0956 am,re: follow appointment.

## 2018-10-07 ENCOUNTER — Other Ambulatory Visit: Payer: Self-pay | Admitting: Cardiology

## 2018-12-16 ENCOUNTER — Other Ambulatory Visit: Payer: Self-pay | Admitting: Cardiology

## 2018-12-16 NOTE — Telephone Encounter (Signed)
Rx(s) sent to pharmacy electronically.  

## 2019-01-04 ENCOUNTER — Other Ambulatory Visit: Payer: Self-pay | Admitting: Cardiology

## 2019-01-24 ENCOUNTER — Telehealth: Payer: Self-pay | Admitting: *Deleted

## 2019-01-24 NOTE — Telephone Encounter (Signed)
Spoke to patient - to switch to virtual appt 01/27/19.  patient decline. Patient prefer to  Have an in office appointment with dr harding. Patient wanted to know why the changes. RN explained due to covid  Pandemic and needing to social distance for appointments. Patient was in agreement to switch to 02/10/19 at 2:40 pm  request a reminder about appointment.

## 2019-01-27 ENCOUNTER — Ambulatory Visit: Payer: Medicare Other | Admitting: Cardiology

## 2019-02-10 ENCOUNTER — Other Ambulatory Visit: Payer: Self-pay

## 2019-02-10 ENCOUNTER — Ambulatory Visit (INDEPENDENT_AMBULATORY_CARE_PROVIDER_SITE_OTHER): Payer: Medicare Other | Admitting: Cardiology

## 2019-02-10 VITALS — BP 113/68 | HR 61 | Temp 97.9°F | Ht 67.0 in | Wt 206.2 lb

## 2019-02-10 DIAGNOSIS — E785 Hyperlipidemia, unspecified: Secondary | ICD-10-CM

## 2019-02-10 DIAGNOSIS — I10 Essential (primary) hypertension: Secondary | ICD-10-CM

## 2019-02-10 DIAGNOSIS — R5383 Other fatigue: Secondary | ICD-10-CM

## 2019-02-10 DIAGNOSIS — I2510 Atherosclerotic heart disease of native coronary artery without angina pectoris: Secondary | ICD-10-CM | POA: Diagnosis not present

## 2019-02-10 DIAGNOSIS — I2119 ST elevation (STEMI) myocardial infarction involving other coronary artery of inferior wall: Secondary | ICD-10-CM | POA: Diagnosis not present

## 2019-02-10 DIAGNOSIS — Z9861 Coronary angioplasty status: Secondary | ICD-10-CM

## 2019-02-10 MED ORDER — METOPROLOL SUCCINATE ER 25 MG PO TB24: 12.5000 mg | ORAL_TABLET | Freq: Every day | ORAL | 3 refills | Status: DC

## 2019-02-10 NOTE — Patient Instructions (Addendum)
Medication Instructions:  DECREASE  TOPROL  TO 1/2 TABLET  IN THE MORNING ALONG WITH TAKING AMLODIPINE  TAKE VALSARTAN AND HCT Z IN THE EVENING   If you need a refill on your cardiac medications before your next appointment, please call your pharmacy.   Lab work: Sometime this month CMP LIPID [id number]-- FASTING   If you have labs (blood work) drawn today and your tests are completely normal, you will receive your results only by: Marland Kitchen MyChart Message (if you have MyChart) OR . A paper copy in the mail If you have any lab test that is abnormal or we need to change your treatment, we will call you to review the results.  Testing/Procedures:  NOT NEEDED  Follow-Up: At [facility], you and your health needs are our priority.  As part of our continuing mission to provide you with exceptional heart care, we have created designated Provider Care Teams.  These Care Teams include your primary Cardiologist (physician) and Advanced Practice Providers (APPs -  Physician Assistants and Nurse Practitioners) who all work together to provide you with the care you need, when you need it. You will need a follow up appointment in 12  Months- Sept 2021.  Please call our office 2 months in advance to schedule this appointment.  You may see Glenetta Hew, MD  Any Other Special Instructions Will Be Listed Below (If Applicable).

## 2019-02-10 NOTE — Progress Notes (Signed)
PCP: Patient, No Pcp Per  Clinic Note: Chief Complaint  Patient presents with  . Annual Exam    No major issues  . Coronary Artery Disease    No angina    HPI: Chase Combs is a 73 y.o. male with a PMH of CAD-Inf STEMI - BMS PCI  RCA (2006) who presents today for delayed annual cardiology follow-up.  Delayed due to COVID-19 scheduled visit.   CAD ->  history BMS PCI of RI w/ Co-Star BMS (2.5 x 16)  in 2006 - Dr. Melvern Banker.   Inf STEMI 01/2009 -->  occluded RCA was treated with thrombectomy and 3.5 mm x 20 mm vision BMS stent placed and postdilated to 4 mm) (Dr. Angelena Form)  Last stress test was in October 2013 that showed no evidence of ischemia but evidence of an inferolateral & inferobasilar wall infarct.  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  Chase Combs was last seen on 09/24/2017 --> doing well.  Just noticing less energy than usual.  Exertional dyspnea if he overdoes it.  Tired at the end of the day.  Recent Hospitalizations: N/A  Studies Personally Reviewed - (if available, images/films reviewed: From Epic Chart or Care Everywhere)  n/a  Interval History: Chase Combs returns today for his delayed annual cardiac evaluation stating that he is feeling pretty much at his baseline.  He is able to exercise to do what he wants to do.  He may get short of breath if he goes up more than 2 flights of steps, or overdoes it carrying something up to steps.  Sometimes walking up a hill fast will make him a bit short of breath.  But otherwise no chest pain or pressure or dyspnea with routine activity.  Has not had any angina since his PCI.  He still indicates that his energy levels little bit down.  He has abnormal sleep patterns, and does not always get a good night sleep which contributes to his daytime fatigue.  He does feel tired at the end of the day of work, but has not got to the point where he has to stop what he is doing.  He is not having any issues with PND or  orthopnea.  Mild edema when he is on his feet all day long.  A little bit of orthostatic dizziness.  No rapid irregular heartbeats or palpitations.  No syncope/near syncope or TIA/amaurosis fugax.  No claudication.  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  ROS: A comprehensive was performed. Review of Systems  Constitutional: Negative for chills, fever and malaise/fatigue (Sleeps poorly at night.  Sometimes sleeps during the day.).  HENT: Negative for congestion and nosebleeds.   Eyes: Negative.   Respiratory: Positive for shortness of breath (Notes exertional dyspnea with increased activity at least 2 flights of stairs.). Negative for cough and wheezing.   Cardiovascular:       Per HPI  Gastrointestinal: Positive for heartburn. Negative for abdominal pain, blood in stool and melena.  Genitourinary: Negative for dysuria and hematuria.  Musculoskeletal: Positive for back pain and joint pain (Hips and knees -> knees are the worst.). Negative for falls.  Neurological: Negative for dizziness, focal weakness and weakness.  Endo/Heme/Allergies: Negative for environmental allergies. Does not bruise/bleed easily.  Psychiatric/Behavioral: Negative for memory loss. The patient does not have insomnia (Not really insomnia, but oftentimes wakes up to urinate and then go back to sleep very easily.).   All other systems reviewed and are  negative.   I have reviewed and (if needed) personally updated the patient's problem list, medications, allergies, past medical and surgical history, social and family history.   Past Medical History:  Diagnosis Date  . Arthritis   . CAD S/P percutaneous coronary angioplasty 06/18/2004; 02/16/2009, 02/2012   a) Angina: CoStar Study Stent 2.5 mm x 16 mm - prox RI; b) DES PCI RCA for Inf STEMI; c) TM Myoview: 7:20, 8 METS, Mild to mod scar with HK in basal-mid inferior and basal inferolateral wall.;  No ischemia; EF 51%  . Dyslipidemia   . Essential hypertension   .  Hyperlipidemia with target LDL less than 70   . Multiple allergies   . S/p ST elevation myocardial infarction (STEMI) of inferior wall 02/16/2009   Occluded RCA --> PCI after thrombectomy 3.5 mm x 20 mm Vision BMs as (4.0 mm)    Past Surgical History:  Procedure Laterality Date  . CAROTID STENT  01/2009  . CORONARY ANGIOPLASTY WITH STENT PLACEMENT  January 2006   PCI of the proximal Ramus Intermedius -- CoStar study stent 2.5 mm x 16 mm  . CORONARY ANGIOPLASTY WITH STENT PLACEMENT  September 2010   Inferior STEMI: Proximal RCA 3.5 mm 23 mm vision BMS (4.0 mm)  . ERCP W/ METAL STENT PLACEMENT  02/16/2009  . GXT  07/2014   Target HR in Stage 3 - no CP. Limited by SOB. Up-sloping ST elevation. Non-ischemic  . NM MYOVIEW LTD  03/15/2012   7:20, 8 METS, Mild to mod scar with HK in basal-mid inferior and basal inferolateral wall.;  No ischemia; EF 51%    Current Meds  Medication Sig  . amLODipine (NORVASC) 10 MG tablet Take 1 tablet (10 mg total) by mouth daily. KEEP OV.  Marland Kitchen aspirin EC 81 MG tablet Take 1 tablet (81 mg total) by mouth daily.  . finasteride (PROSCAR) 5 MG tablet Take 1 tablet by mouth daily.  . hydrochlorothiazide (HYDRODIURIL) 25 MG tablet Take 1 tablet (25 mg total) by mouth daily. NEED OV.  . hydrocortisone 2.5 % lotion Apply topically 2 (two) times daily.  . metoprolol succinate (TOPROL-XL) 25 MG 24 hr tablet Take 0.5 tablets (12.5 mg total) by mouth daily.  . Multiple Vitamin (MULTIVITAMIN) tablet Take 1 tablet by mouth daily.  . rosuvastatin (CRESTOR) 20 MG tablet TAKE 1 TABLET BY MOUTH DAILY  . valsartan (DIOVAN) 320 MG tablet TAKE 1 TABLET BY MOUTH EVERY DAY  . [DISCONTINUED] metoprolol succinate (TOPROL-XL) 25 MG 24 hr tablet Take 1 tablet (25 mg total) by mouth daily. MUST KEEP APPOINTMENT 01/27/19 WITH DR Minsa Weddington FOR FUTURE REFILLS  . [DISCONTINUED] rosuvastatin (CRESTOR) 20 MG tablet TAKE 1 TABLET BY MOUTH ONCE DAILY  . [DISCONTINUED] tamsulosin (FLOMAX) 0.4 MG CAPS  capsule Take 1 capsule by mouth daily.    Allergies  Allergen Reactions  . Codeine Nausea And Vomiting    Social History   Tobacco Use  . Smoking status: Never Smoker  . Smokeless tobacco: Never Used  Substance Use Topics  . Alcohol use: No    Alcohol/week: 0.0 standard drinks  . Drug use: No   Social History   Social History Narrative   He is married, father of 53, grandfather of 65. Never smoked. Does not drink.    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    He does lots of lifting, pulling with ropes,  using upper extremities.       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    family history includes Cancer in his father and mother.  Wt Readings from Last 3 Encounters:  02/10/19 206 lb 3.2 oz (93.5 kg)  09/24/17 209 lb 9.6 oz (95.1 kg)  09/24/16 206 lb 6.4 oz (93.6 kg)    PHYSICAL EXAM BP 113/68   Pulse 61   Temp 97.9 F (36.6 C)   Ht 5\' 7"  (1.702 m)   Wt 206 lb 3.2 oz (93.5 kg)   SpO2 94%   BMI 32.30 kg/m  Physical Exam  Constitutional: He is oriented to person, place, and time. He appears well-developed and well-nourished. No distress.  Healthy-appearing.  Well-groomed  HENT:  Head: Normocephalic and atraumatic.  Mouth/Throat: No oropharyngeal exudate.  Eyes: EOM are normal.  Neck: Normal range of motion. Neck supple. No hepatojugular reflux and no JVD present. Carotid bruit is not present. No thyromegaly present.  Cardiovascular: Normal rate, regular rhythm, normal heart sounds, intact distal pulses and normal pulses.  Occasional extrasystoles are present. PMI is not displaced. Exam reveals no gallop and no friction rub.  No murmur heard. CRO split S2  Pulmonary/Chest: Effort normal and breath sounds normal. No respiratory distress. He has no wheezes. He has no  rales.  Abdominal: Soft. Bowel sounds are normal. He exhibits no distension. There is no abdominal tenderness. There is no rebound.  No HSM  Musculoskeletal: Normal range of motion.        General: No edema (Trivial).  Neurological: He is alert and oriented to person, place, and time.  Psychiatric: He has a normal mood and affect. His behavior is normal. Judgment and thought content normal.  Vitals reviewed.    Adult ECG Report  Rate: 71;  Rhythm: normal sinus rhythm, sinus arrhythmia and PVCs; Has marked sinus arrhythmia with occasional PVCs.  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   Other studies Reviewed: Additional studies/ records that were reviewed today include:  Recent Labs:  Lab Results  Component Value Date   CHOL 117 12/03/2017   HDL 36 (L) 12/03/2017   LDLCALC 65 12/03/2017   TRIG 80 12/03/2017   CHOLHDL 3.3 12/03/2017   Lab Results  Component Value Date   CREATININE 0.69 (L) 12/03/2017   BUN 21 12/03/2017   NA 141 12/03/2017   K 4.3 12/03/2017   CL 100 12/03/2017   CO2 25 12/03/2017    ASSESSMENT / PLAN: Problem List Items Addressed This Visit    CAD S/P percutaneous coronary angioplasty - Primary (Chronic)    He had a bare-metal stent in 2006 in the setting of a angina episode.  Had been a inferior STEMI in 2010 with a second bare-metal stent placed in the RCA.  Most recent stress test was in 2013, negative.  Had mild scar.  He is doing well without any anginal symptoms.  He has with exertional dyspnea that is pretty stable.  At this point, he is only on aspirin along with beta-blocker ARB and statin.  I plan to reduce his beta-blocker dose and change dosing interval to taking it at nighttime.  Hopefully this will help some of his issues with sleep and daytime fatigue  Relevant  Medications   metoprolol succinate (TOPROL-XL) 25 MG 24 hr tablet   Other Relevant Orders   EKG 12-Lead   Lipid panel   Comprehensive metabolic panel   Hemoglobin A1c   S/p ST elevation myocardial infarction (STEMI) of inferior wall (September 2010) (Chronic)   Relevant Medications   metoprolol succinate (TOPROL-XL) 25 MG 24 hr tablet   Other Relevant Orders   EKG 12-Lead   Lipid panel   Comprehensive metabolic panel   Hemoglobin A1c   Essential hypertension (Chronic)    Blood pressure actually looks pretty good a little leery of having some dizziness. Plan will be to switch amlodipine and Toprol to PN and take the valsartan-HCTZ in the morning.      Relevant Medications   metoprolol succinate (TOPROL-XL) 25 MG 24 hr tablet   Other Relevant Orders   EKG 12-Lead   Lipid panel   Comprehensive metabolic panel   Hemoglobin A1c   Dyslipidemia, goal LDL below 70 (Chronic)    Lipids are pretty well controlled as of last July.  Is due for annual follow-up labs now. LDL was 65 last year on current dose of rosuvastatin.  We will plan to continue on this current dose      Relevant Medications   metoprolol succinate (TOPROL-XL) 25 MG 24 hr tablet   Other Relevant Orders   EKG 12-Lead   Lipid panel   Comprehensive metabolic panel   Hemoglobin A1c   Fatigue due to treatment    Last year I did recommend that he switch his Toprol to evening, but he did not.  About a switch to evening now and reduced by one half dose.          I spent a total of 25 minutes with the patient and chart review. >  50% of the time was spent in direct patient consultation.   Current medicines are reviewed at length with the patient today.  (+/- concerns) n/a The following changes have been made:  n/a   Patient Instructions  Medication Instructions:  DECREASE  TOPROL  TO 1/2 TABLET  IN THE MORNING ALONG WITH TAKING AMLODIPINE  TAKE VALSARTAN AND HCT Z IN THE EVENING   If you need a refill on your  cardiac medications before your next appointment, please call your pharmacy.   Lab work: Sometime this month CMP LIPID HGBA1C-- FASTING   If you have labs (blood work) drawn today and your tests are completely normal, you will receive your results only by: Marland Kitchen MyChart Message (if you have MyChart) OR . A paper copy in the mail If you have any lab test that is abnormal or we need to change your treatment, we will call you to review the results.  Testing/Procedures:  NOT NEEDED  Follow-Up: At [facility], you and your health needs are our priority.  As part of our continuing mission to provide you with exceptional heart care, we have created designated Provider Care Teams.  These Care Teams include your primary Cardiologist (physician) and Advanced Practice Providers (APPs -  Physician Assistants and Nurse Practitioners) who all work together to provide you with the care you need, when you need it. You will need a follow up appointment in 12  Months- Sept 2021.  Please call our office 2 months in advance to schedule this appointment.  You may see Glenetta Hew, MD  Any Other Special Instructions Will Be Listed Below (If Applicable).  Studies Ordered:   Orders Placed This Encounter  Procedures  . Lipid panel  . Comprehensive metabolic panel  . Hemoglobin A1c  . EKG 12-Lead      Glenetta Hew, M.D., M.S. Interventional Cardiologist   Pager # [phone] Phone # [phone] [address] [place], [place] [address]   Thank you for choosing Heartcare at [facility]!!

## 2019-02-11 DIAGNOSIS — H6123 Impacted cerumen, bilateral: Secondary | ICD-10-CM | POA: Diagnosis not present

## 2019-02-12 ENCOUNTER — Encounter: Payer: Self-pay | Admitting: Cardiology

## 2019-02-12 NOTE — Assessment & Plan Note (Signed)
Last year I did recommend that he switch his Toprol to evening, but he did not.  About a switch to evening now and reduced by one half dose.

## 2019-02-12 NOTE — Assessment & Plan Note (Signed)
Lipids are pretty well controlled as of last July.  Is due for annual follow-up labs now. LDL was 65 last year on current dose of rosuvastatin.  We will plan to continue on this current dose

## 2019-02-12 NOTE — Assessment & Plan Note (Signed)
Blood pressure actually looks pretty good a little leery of having some dizziness. Plan will be to switch amlodipine and Toprol to PN and take the valsartan-HCTZ in the morning.

## 2019-02-12 NOTE — Assessment & Plan Note (Signed)
He had a bare-metal stent in 2006 in the setting of a angina episode.  Had been a inferior STEMI in 2010 with a second bare-metal stent placed in the RCA.  Most recent stress test was in 2013, negative.  Had mild scar.  He is doing well without any anginal symptoms.  He has with exertional dyspnea that is pretty stable.  At this point, he is only on aspirin along with beta-blocker ARB and statin.  I plan to reduce his beta-blocker dose and change dosing interval to taking it at nighttime.  Hopefully this will help some of his issues with sleep and daytime fatigue

## 2019-02-22 ENCOUNTER — Other Ambulatory Visit: Payer: Self-pay | Admitting: Cardiology

## 2019-03-16 ENCOUNTER — Other Ambulatory Visit: Payer: Self-pay | Admitting: Cardiology

## 2019-03-28 ENCOUNTER — Other Ambulatory Visit: Payer: Self-pay | Admitting: Urology

## 2019-03-28 DIAGNOSIS — R972 Elevated prostate specific antigen [PSA]: Secondary | ICD-10-CM

## 2019-04-04 ENCOUNTER — Other Ambulatory Visit: Payer: Self-pay | Admitting: Cardiology

## 2019-05-24 DIAGNOSIS — E785 Hyperlipidemia, unspecified: Secondary | ICD-10-CM | POA: Diagnosis not present

## 2019-05-24 DIAGNOSIS — I2119 ST elevation (STEMI) myocardial infarction involving other coronary artery of inferior wall: Secondary | ICD-10-CM | POA: Diagnosis not present

## 2019-05-24 DIAGNOSIS — Z9861 Coronary angioplasty status: Secondary | ICD-10-CM | POA: Diagnosis not present

## 2019-05-24 DIAGNOSIS — I10 Essential (primary) hypertension: Secondary | ICD-10-CM | POA: Diagnosis not present

## 2019-05-24 DIAGNOSIS — I2510 Atherosclerotic heart disease of native coronary artery without angina pectoris: Secondary | ICD-10-CM | POA: Diagnosis not present

## 2019-05-25 LAB — COMPREHENSIVE METABOLIC PANEL WITH GFR
ALT: 33 IU/L (ref 0–44)
AST: 27 IU/L (ref 0–40)
Albumin/Globulin Ratio: 2 (ref 1.2–2.2)
Albumin: 4.6 g/dL (ref 3.7–4.7)
Alkaline Phosphatase: 77 IU/L (ref 39–117)
BUN/Creatinine Ratio: 27 — ABNORMAL HIGH (ref 10–24)
BUN: 20 mg/dL (ref 8–27)
Bilirubin Total: 0.5 mg/dL (ref 0.0–1.2)
CO2: 26 mmol/L (ref 20–29)
Calcium: 9.5 mg/dL (ref 8.6–10.2)
Chloride: 100 mmol/L (ref 96–106)
Creatinine, Ser: 0.73 mg/dL — ABNORMAL LOW (ref 0.76–1.27)
GFR calc Af Amer: 108 mL/min/1.73
GFR calc non Af Amer: 93 mL/min/1.73
Globulin, Total: 2.3 g/dL (ref 1.5–4.5)
Glucose: 114 mg/dL — ABNORMAL HIGH (ref 65–99)
Potassium: 4.4 mmol/L (ref 3.5–5.2)
Sodium: 139 mmol/L (ref 134–144)
Total Protein: 6.9 g/dL (ref 6.0–8.5)

## 2019-05-25 LAB — HEMOGLOBIN A1C
Est. average glucose Bld gHb Est-mCnc: 128 mg/dL
Hgb A1c MFr Bld: 6.1 % — ABNORMAL HIGH (ref 4.8–5.6)

## 2019-05-25 LAB — LIPID PANEL
Chol/HDL Ratio: 3.3 ratio (ref 0.0–5.0)
Cholesterol, Total: 129 mg/dL (ref 100–199)
HDL: 39 mg/dL — ABNORMAL LOW (ref >39)
LDL Chol Calc (NIH): 67 mg/dL (ref 0–99)
Triglycerides: 127 mg/dL (ref 0–149)
VLDL Cholesterol Cal: 23 mg/dL (ref 5–40)

## 2019-06-07 ENCOUNTER — Telehealth: Payer: Self-pay | Admitting: *Deleted

## 2019-06-07 NOTE — Telephone Encounter (Signed)
The patient has been notified of the result and verbalized understanding.  All questions (if any) were answered. Letter mailed  Raiford Simmonds, RN 06/07/2019 11:20 AM

## 2019-06-07 NOTE — Telephone Encounter (Signed)
-----   Message from Leonie Man, MD sent at 05/29/2019  8:05 AM EST ----- Overall labs look pretty stable.  LDL is 67.  Would like it to be a little lower than that but still is less than 70.  Otherwise kidney function was normal and electrolytes all look good.  Would like to see if he can increase his Crestor dose to 40 mg for at least 2 days out of the week and recheck in 6 months.  Glenetta Hew, MD

## 2019-06-08 ENCOUNTER — Other Ambulatory Visit: Payer: Self-pay | Admitting: Cardiology

## 2019-06-15 ENCOUNTER — Ambulatory Visit
Admission: RE | Admit: 2019-06-15 | Discharge: 2019-06-15 | Disposition: A | Discharge status: Home / Self Care | Payer: Medicare Other | Source: Ambulatory Visit | Attending: Urology | Admitting: Urology

## 2019-06-15 ENCOUNTER — Other Ambulatory Visit: Payer: Self-pay

## 2019-06-15 DIAGNOSIS — R972 Elevated prostate specific antigen [PSA]: Secondary | ICD-10-CM

## 2019-06-15 MED ORDER — GADOBENATE DIMEGLUMINE 529 MG/ML IV SOLN
19.0000 mL | Freq: Once | INTRAVENOUS | Status: AC | PRN
  Administered 2019-06-15: 19 mL via INTRAVENOUS

## 2019-07-06 ENCOUNTER — Other Ambulatory Visit: Payer: Self-pay | Admitting: Cardiology

## 2019-08-20 ENCOUNTER — Other Ambulatory Visit: Payer: Self-pay | Admitting: Cardiology

## 2019-11-18 ENCOUNTER — Other Ambulatory Visit: Payer: Self-pay | Admitting: Cardiology

## 2019-12-04 ENCOUNTER — Other Ambulatory Visit: Payer: Self-pay | Admitting: Cardiology

## 2020-01-02 ENCOUNTER — Telehealth: Payer: Self-pay | Admitting: *Deleted

## 2020-01-02 NOTE — Telephone Encounter (Signed)
A message was left, re: his follow up visit with Dr.Harding.

## 2020-01-03 ENCOUNTER — Other Ambulatory Visit: Payer: Self-pay | Admitting: Cardiology

## 2020-02-15 ENCOUNTER — Other Ambulatory Visit: Payer: Self-pay

## 2020-02-15 ENCOUNTER — Ambulatory Visit: Payer: Medicare Other | Admitting: Cardiology

## 2020-02-15 ENCOUNTER — Encounter: Payer: Self-pay | Admitting: Cardiology

## 2020-02-15 VITALS — BP 132/68 | HR 81 | Ht 72.0 in | Wt 211.0 lb

## 2020-02-15 DIAGNOSIS — I10 Essential (primary) hypertension: Secondary | ICD-10-CM | POA: Diagnosis not present

## 2020-02-15 DIAGNOSIS — E785 Hyperlipidemia, unspecified: Secondary | ICD-10-CM

## 2020-02-15 DIAGNOSIS — I2119 ST elevation (STEMI) myocardial infarction involving other coronary artery of inferior wall: Secondary | ICD-10-CM | POA: Diagnosis not present

## 2020-02-15 DIAGNOSIS — Z9861 Coronary angioplasty status: Secondary | ICD-10-CM

## 2020-02-15 DIAGNOSIS — R739 Hyperglycemia, unspecified: Secondary | ICD-10-CM | POA: Insufficient documentation

## 2020-02-15 DIAGNOSIS — R5383 Other fatigue: Secondary | ICD-10-CM

## 2020-02-15 DIAGNOSIS — I2510 Atherosclerotic heart disease of native coronary artery without angina pectoris: Secondary | ICD-10-CM

## 2020-02-15 NOTE — Patient Instructions (Addendum)
  Lab Work: Your physician recommends that you return for lab work.  If you have labs (blood work) drawn today and your tests are completely normal, you will receive your results only by: Marland Kitchen MyChart Message (if you have MyChart) OR . A paper copy in the mail If you have any lab test that is abnormal or we need to change your treatment, we will call you to review the results.    Follow-Up: At [facility], you and your health needs are our priority.  As part of our continuing mission to provide you with exceptional heart care, we have created designated Provider Care Teams.  These Care Teams include your primary Cardiologist (physician) and Advanced Practice Providers (APPs -  Physician Assistants and Nurse Practitioners) who all work together to provide you with the care you need, when you need it.  We recommend signing up for the patient portal called "MyChart".  Sign up information is provided on this After Visit Summary.  MyChart is used to connect with patients for Virtual Visits (Telemedicine).  Patients are able to view lab/test results, encounter notes, upcoming appointments, etc.  Non-urgent messages can be sent to your provider as well.   To learn more about what you can do with MyChart, go to [email or web address].    Your next appointment:   12 month(s)  The format for your next appointment:   In Person  Provider:   You may see DR. DAVID HARDING or one of the following Advanced Practice Providers on your designated Care Team:    Rosaria Ferries, PA-C  Jory Sims, DNP, ANP

## 2020-02-15 NOTE — Progress Notes (Signed)
Primary Care Provider: Patient, No Pcp Per Cardiologist: No primary care provider on file. Electrophysiologist: None  Clinic Note: Chief Complaint  Patient presents with  . Follow-up    Annual  . Coronary Artery Disease    No angina    HPI:    Chase Combs is a 74 y.o. male with a CAD history noted below who presents today for annual follow-up.   CAD HISTORY  2006 (chest pain) --> BMS PCI of RI w/ Co-Star BMS (2.5 x 16)  (Dr. Melvern Banker).   Inf STEMI 01/2009 -->  occluded RCA was treated with thrombectomy and 3.5 mm x 20 mm vision BMS stent placed and postdilated to 4 mm) (Dr. Angelena Form)  Last stress test was in October 2013 that showed no evidence of ischemia but evidence of an inferolateral & inferobasilar wall infarct.  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  Chase Combs was last seen on February 10, 2019 for delayed annual follow-up due to COVID-19.  He was pretty much exercising to the extent that he would like with no issues.  He gets short of breath if he goes up at least 2 flights of steps or overdoes it by carrying something up steps.  If he walks fast up a hill he may have to stop to catch his breath.  No chest pain or pressure.  Noted a little bit decrease in energy level.  But also partly related to abnormal sleep patterns--not sleeping well; finds it hard to go back to sleep after waking up at night for nocturia..  Feels tired at the end of the day of work. --> Exercise somewhat limited by back and joint pain in both the hips and knees.  Toprol dose was decreased to 1/2 tablet in the morning along with amlodipine and valsartan-HCTZ was switched to the evening.  Recent Hospitalizations: None  Reviewed  CV studies:    The following studies were reviewed today: (if available, images/films reviewed: From Epic Chart or Care Everywhere) . None:   Interval History:   Chase Combs  returns here today for annual follow-up pretty well.  Much like last year, he may get short of breath if he goes up stairs too fast there carry something but does get short of breath on flat ground He had a hard time with CPAP tolerating at nighttime.  When he does work, he does sleep better, but is having to get up all the time to go to the bathroom.  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  40 notes sometimes at work is that if he bends over he gets dizzy with bending over not necessarily with splenomegaly.  Otherwise no syncope or near syncope. He has not had any anginal chest pain or pressure with rest or exertion.  No heart failure symptoms..  CV Review of Symptoms (Summary): no chest pain or dyspnea on exertion positive for - irregular heartbeat, palpitations, rapid heart rate and Very infrequent skipped beats, but sometimes that will go up a bit faster than usual, when he is exercising. negative for - chest pain, edema, orthopnea, palpitations, rapid heart rate or shortness of breath; syncope/near syncope or TIA/amaurosis fugax,  claudication  The patient does not have symptoms concerning for COVID-19 infection (fever, chills, cough, or  new shortness of breath).   REVIEWED OF SYSTEMS   Review of Systems  Constitutional: Negative for malaise/fatigue and weight loss.  HENT: Negative for nosebleeds and sinus pain.   Respiratory: Negative for cough and shortness of breath (Only with vigorous exertion, or carrying something up steps.).   Cardiovascular: Negative for leg swelling.  Gastrointestinal: Negative for blood in stool and melena.  Genitourinary: Negative for dysuria and hematuria.  Musculoskeletal: Positive for joint pain (Mostly nights -> worse with left knee pain). Negative for back pain and falls.  Neurological: Positive for dizziness (Bending over) and weakness (Still not  sleeping well).  Psychiatric/Behavioral: The patient is nervous/anxious and has insomnia.    I have reviewed and (if needed) personally updated the patient's problem list, medications, allergies, past medical and surgical history, social and family history.   PAST MEDICAL HISTORY   Past Medical History:  Diagnosis Date  . Arthritis   . CAD S/P percutaneous coronary angioplasty 06/18/2004; 02/16/2009, 02/2012   a) Angina: CoStar Study Stent 2.5 mm x 16 mm - prox RI; b) DES PCI RCA for Inf STEMI; c) TM Myoview: 7:20, 8 METS, Mild to mod scar with HK in basal-mid inferior and basal inferolateral wall.;  No ischemia; EF 51%  . Dyslipidemia   . Essential hypertension   . Hyperlipidemia with target LDL less than 70   . Multiple allergies   . S/p ST elevation myocardial infarction (STEMI) of inferior wall 02/16/2009   Occluded RCA --> PCI after thrombectomy 3.5 mm x 20 mm Vision BMs as (4.0 mm)    PAST SURGICAL HISTORY   Past Surgical History:  Procedure Laterality Date  . CAROTID STENT  01/2009  . CORONARY ANGIOPLASTY WITH STENT PLACEMENT  January 2006   PCI of the proximal Ramus Intermedius -- CoStar study stent 2.5 mm x 16 mm  . CORONARY ANGIOPLASTY WITH STENT PLACEMENT  September 2010   Inferior STEMI: Proximal RCA 3.5 mm 23 mm vision BMS (4.0 mm)  . ERCP W/ METAL STENT PLACEMENT  02/16/2009  . GXT  07/2014   Target HR in Stage 3 - no CP. Limited by SOB. Up-sloping ST elevation. Non-ischemic  . NM MYOVIEW LTD  03/15/2012   7:20, 8 METS, Mild to mod scar with HK in basal-mid inferior and basal inferolateral wall.;  No ischemia; EF 51%    Immunization History  Administered Date(s) Administered  . Tdap 10/08/2014    MEDICATIONS/ALLERGIES   Current Meds  Medication Sig  . amLODipine (NORVASC) 10 MG tablet TAKE 1 TABLET BY MOUTH EVERY DAY  . amoxicillin (AMOXIL) 500 MG capsule TK 4 CS PO 1 HR PRIOR TO DENTAL APPOINTMENT  . aspirin EC 81 MG tablet Take 1 tablet (81 mg total) by mouth  daily.  . finasteride (PROSCAR) 5 MG tablet Take 1 tablet by mouth daily.  . hydrochlorothiazide (HYDRODIURIL) 25 MG tablet TAKE 1 TABLET(25 MG) BY MOUTH DAILY  . hydrocortisone 2.5 % lotion Apply topically 2 (two) times daily.  . metoprolol succinate (TOPROL-XL) 25 MG 24 hr tablet Take 0.5 tablets (12.5 mg total) by mouth daily.  . Multiple Vitamin (MULTIVITAMIN) tablet Take 1 tablet by mouth daily.  . rosuvastatin (CRESTOR) 20 MG tablet TAKE 1 TABLET(20 MG) BY MOUTH DAILY  . [DISCONTINUED] valsartan (DIOVAN) 320 MG tablet TAKE 1 TABLET BY MOUTH EVERY DAY    Allergies  Allergen Reactions  . Codeine Nausea And Vomiting    SOCIAL HISTORY/FAMILY HISTORY   Reviewed in Epic:  Pertinent findings: No  new findings.  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 Social History   Tobacco Use  . Smoking status: Never Smoker  . Smokeless tobacco: Never Used  Substance Use Topics  . Alcohol use: No    Alcohol/week: 0.0 standard drinks  . Drug use: No   Social History   Social History Narrative   He is married, father of 72, grandfather of 61. Never smoked. Does not drink.    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    He does lots of lifting, pulling with ropes, using upper extremities.       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     OBJCTIVE -PE, EKG, labs   Wt Readings from Last 3 Encounters:  02/15/20 211 lb (95.7 kg)  02/10/19 206 lb 3.2 oz (93.5 kg)  09/24/17 209 lb 9.6 oz (95.1 kg)    Physical Exam: BP 132/68   Pulse 81   Ht 6' (1.829 m)   Wt 211 lb (95.7 kg)   SpO2 98%   BMI 28.62 kg/m  Physical Exam Constitutional:      General: He is not in acute distress.    Appearance: Normal appearance. He is normal weight. He  is not ill-appearing or toxic-appearing.  HENT:     Head: Normocephalic and atraumatic.  Cardiovascular:     Rate and Rhythm: Normal rate and regular rhythm.     Pulses: Normal pulses.     Heart sounds: Normal heart sounds.  Pulmonary:     Effort: Pulmonary effort is normal. No respiratory distress.     Breath sounds: Normal breath sounds.  Chest:     Chest wall: No tenderness.  Abdominal:     Palpations: There is no mass.  Musculoskeletal:        General: No swelling. Normal range of motion.  Neurological:     General: No focal deficit present.     Mental Status: He is alert and oriented to person, place, and time.  Psychiatric:        Mood and Affect: Mood normal.        Thought Content: Thought content normal.        Judgment: Judgment normal.     Adult ECG Report  Rate: 81;  Rhythm: normal sinus rhythm, premature ventricular contractions (PVC) and And fusion beats; RBBB, LAFB-bifascicular block.  Narrative Interpretation: Relatively stable  Recent Labs:    Lab Results  Component Value Date   CHOL 129 05/24/2019   HDL 39 (L) 05/24/2019   LDLCALC 67 05/24/2019   TRIG 127 05/24/2019   CHOLHDL 3.3 05/24/2019   Lab Results  Component Value Date   CREATININE 0.73 (L) 05/24/2019   BUN 20 05/24/2019   NA 139 05/24/2019   K 4.4 05/24/2019   CL 100 05/24/2019   CO2 26 05/24/2019   Lab Results  Component Value Date   TSH 2.710 12/03/2017    ASSESSMENT/PLAN    Problem List Items Addressed This Visit    CAD S/P percutaneous coronary angioplasty - Primary (Chronic)    Bare-metal RCA stent in 2006 followed by second bare-metal stent to the occluded RCA for inferior STEMI 2010.  No anginal symptoms since.  Last Myoview was in 2013 and treadmill stress test was 2016.  He is not having any active anginal symptoms.  I will see any reason for retesting.  No active anginal symptoms.  Remains on amlodipine and Toprol along with losartan.  He is on aspirin alone without  other antiplatelet agents.  Relatively stable. On rosuvastatin, and his lipids have been very well controlled.       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  No further active angina or heart failure symptoms. He is on beta-blocker amlodipine and statin along with aspirin.      Relevant Orders   EKG 12-Lead (Completed)   Comprehensive Metabolic Panel (CMET)   CBC   Lipid Profile   TSH   HgB A1c   Essential hypertension (Chronic)    Stable blood pressures, borderline elevated on current dose of amlodipine and Toprol along with valsartan plus HCTZ.  Monitor for hypotension, and bradycardia, but may need to titrate up beta-blocker.      Relevant Orders   EKG 12-Lead (Completed)   Comprehensive Metabolic Panel (CMET)   CBC   Lipid Profile   TSH   HgB A1c   Dyslipidemia, goal LDL below 70 (Chronic)    Lipids were pretty well controlled as of 2020.  Should be due to have labs checked in the next couple months. Continue current dose of rosuvastatin for now.      Relevant Orders   EKG 12-Lead (Completed)   Comprehensive Metabolic Panel (CMET)   CBC   Lipid Profile   TSH   HgB A1c   Fatigue due to treatment (Chronic)    Recommend that he take his Toprol half dose and change the evening.  The he did not do that so I recommended again.  We will check TSH and CBC just to make sure no other abnormalities.      Relevant Orders   EKG 12-Lead (Completed)   Comprehensive Metabolic Panel (CMET)   CBC   Lipid Profile   TSH   HgB A1c   Hyperglycemia, unspecified    Has had intermittent fasting glucose levels that are elevated, will check an A1c along with CBC and CMP.      Relevant Orders   EKG 12-Lead (Completed)    Comprehensive Metabolic Panel (CMET)   CBC   Lipid Profile   TSH   HgB A1c      COVID-19 Education: The signs and symptoms of COVID-19 were discussed with the patient and how to seek care for testing (follow up with PCP or arrange E-visit).   The importance of social distancing and COVID-19 vaccination was discussed today. 4 min The patient is practicing social distancing & Masking.   I spent a total of 25 minutes with the patient spent in direct patient consultation.  Additional time spent with chart review  / charting (studies, outside notes, etc): 8 Total Time: 32 min   Current medicines are reviewed at length with the patient today.  (+/- concerns) none  Notice: This dictation was prepared with Dragon dictation along with smaller phrase technology. Any transcriptional errors that result from this process are unintentional and may not be corrected upon review.  Patient Instructions / Medication  Changes & Studies & Tests Ordered   Patient Instructions   Lab Work: Your physician recommends that you return for lab work.  If you have labs (blood work) drawn today and your tests are completely normal, you will receive your results only by: [facility] MyChart Message (if you have MyChart) OR . A paper copy in the mail If you have any lab test that is abnormal or we need to change your treatment, we will call you to review the results.    Follow-Up: At [facility], you and your health needs are our priority.  As part of our continuing mission to provide you with exceptional heart care, we have created designated Provider Care Teams.  These Care Teams include your primary Cardiologist (physician) and Advanced Practice Providers (APPs -  Physician Assistants and Nurse Practitioners) who all work together to provide you with the care you need, when you need it.  We recommend signing up for the patient portal called "MyChart".  Sign up information is provided on this After Visit Summary.   MyChart is used to connect with patients for Virtual Visits (Telemedicine).  Patients are able to view lab/test results, encounter notes, upcoming appointments, etc.  Non-urgent messages can be sent to your provider as well.   To learn more about what you can do with MyChart, go to [email or web address].    Your next appointment:   12 month(s)  The format for your next appointment:   In Person  Provider:   You may see DR. Alonnie Bieker or one of the following Advanced Practice Providers on your designated Care Team:    Rosaria Ferries, PA-C  Jory Sims, DNP, ANP      Studies Ordered:   Orders Placed This Encounter  Procedures  . Comprehensive Metabolic Panel (CMET)  . CBC  . Lipid Profile  . TSH  . HgB A1c  . EKG 12-Lead     Glenetta Hew, M.D., M.S. Interventional Cardiologist   Pager # [phone] Phone # [phone] [address] [place], [place] [address]   Thank you for choosing Heartcare at [facility]!!

## 2020-02-16 ENCOUNTER — Other Ambulatory Visit: Payer: Self-pay | Admitting: Cardiology

## 2020-02-22 ENCOUNTER — Encounter: Payer: Self-pay | Admitting: Cardiology

## 2020-02-22 NOTE — Assessment & Plan Note (Addendum)
Mild to moderate inferior scar noted on Myoview with mildly reduced EF.  No further active angina or heart failure symptoms. He is on beta-blocker amlodipine and statin along with aspirin.

## 2020-02-22 NOTE — Assessment & Plan Note (Signed)
Bare-metal RCA stent in 2006 followed by second bare-metal stent to the occluded RCA for inferior STEMI 2010.  No anginal symptoms since.  Last Myoview was in 2013 and treadmill stress test was 2016.  He is not having any active anginal symptoms.  I will see any reason for retesting.  No active anginal symptoms.  Remains on amlodipine and Toprol along with losartan.  He is on aspirin alone without other antiplatelet agents.  Relatively stable. On rosuvastatin, and his lipids have been very well controlled.

## 2020-02-22 NOTE — Assessment & Plan Note (Signed)
Has had intermittent fasting glucose levels that are elevated, will check an A1c along with CBC and CMP.

## 2020-02-22 NOTE — Assessment & Plan Note (Signed)
Stable blood pressures, borderline elevated on current dose of amlodipine and Toprol along with valsartan plus HCTZ.  Monitor for hypotension, and bradycardia, but may need to titrate up beta-blocker.

## 2020-02-22 NOTE — Assessment & Plan Note (Signed)
Lipids were pretty well controlled as of 2020.  Should be due to have labs checked in the next couple months. Continue current dose of rosuvastatin for now.

## 2020-02-22 NOTE — Assessment & Plan Note (Signed)
Recommend that he take his Toprol half dose and change the evening.  The he did not do that so I recommended again.  We will check TSH and CBC just to make sure no other abnormalities.

## 2020-03-02 LAB — COMPREHENSIVE METABOLIC PANEL
ALT: 25 IU/L (ref 0–44)
AST: 19 IU/L (ref 0–40)
Albumin/Globulin Ratio: 1.9 (ref 1.2–2.2)
Albumin: 4.3 g/dL (ref 3.7–4.7)
Alkaline Phosphatase: 76 IU/L (ref 44–121)
BUN/Creatinine Ratio: 26 — ABNORMAL HIGH (ref 10–24)
BUN: 20 mg/dL (ref 8–27)
Bilirubin Total: 0.4 mg/dL (ref 0.0–1.2)
CO2: 25 mmol/L (ref 20–29)
Calcium: 9.4 mg/dL (ref 8.6–10.2)
Chloride: 101 mmol/L (ref 96–106)
Creatinine, Ser: 0.76 mg/dL (ref 0.76–1.27)
GFR calc Af Amer: 105 mL/min/{1.73_m2} (ref >59)
GFR calc non Af Amer: 91 mL/min/{1.73_m2} (ref >59)
Globulin, Total: 2.3 g/dL (ref 1.5–4.5)
Glucose: 132 mg/dL — ABNORMAL HIGH (ref 65–99)
Potassium: 4.6 mmol/L (ref 3.5–5.2)
Sodium: 139 mmol/L (ref 134–144)
Total Protein: 6.6 g/dL (ref 6.0–8.5)

## 2020-03-02 LAB — TSH: TSH: 2.06 u[IU]/mL (ref 0.450–4.500)

## 2020-03-02 LAB — LIPID PANEL
Chol/HDL Ratio: 3.2 ratio (ref 0.0–5.0)
Cholesterol, Total: 111 mg/dL (ref 100–199)
HDL: 35 mg/dL — ABNORMAL LOW (ref >39)
LDL Chol Calc (NIH): 59 mg/dL (ref 0–99)
Triglycerides: 85 mg/dL (ref 0–149)
VLDL Cholesterol Cal: 17 mg/dL (ref 5–40)

## 2020-03-02 LAB — CBC
Hematocrit: 45.5 % (ref 37.5–51.0)
Hemoglobin: 15.3 g/dL (ref 13.0–17.7)
MCH: 31.2 pg (ref 26.6–33.0)
MCHC: 33.6 g/dL (ref 31.5–35.7)
MCV: 93 fL (ref 79–97)
Platelets: 282 10*3/uL (ref 150–450)
RBC: 4.91 x10E6/uL (ref 4.14–5.80)
RDW: 12.7 % (ref 11.6–15.4)
WBC: 6.2 10*3/uL (ref 3.4–10.8)

## 2020-03-02 LAB — HEMOGLOBIN A1C
Est. average glucose Bld gHb Est-mCnc: 151 mg/dL
Hgb A1c MFr Bld: 6.9 % — ABNORMAL HIGH (ref 4.8–5.6)

## 2020-03-06 ENCOUNTER — Telehealth: Payer: Self-pay | Admitting: *Deleted

## 2020-03-06 DIAGNOSIS — E119 Type 2 diabetes mellitus without complications: Secondary | ICD-10-CM

## 2020-03-06 DIAGNOSIS — R739 Hyperglycemia, unspecified: Secondary | ICD-10-CM

## 2020-03-06 MED ORDER — METFORMIN HCL 500 MG PO TABS: 500.0000 mg | ORAL_TABLET | Freq: Two times a day (BID) | ORAL | 4 refills | Status: DC

## 2020-03-06 NOTE — Telephone Encounter (Signed)
Called  Left message on voicemail to call back in regards to labs

## 2020-03-06 NOTE — Telephone Encounter (Signed)
Patient returning call.

## 2020-03-06 NOTE — Telephone Encounter (Signed)
The patient has been notified of the result and verbalized understanding.  All questions (if any) were answered. Patient states he does not have a primary doctor. Will send referral to [place]  [facility] . Patient states he had went there in the past. Medication e-sent pharmacy.  patient verbalized understanding   hgbA1c 6.9 Raiford Simmonds, [place] 03/06/2020 4:11 PM

## 2020-03-06 NOTE — Telephone Encounter (Signed)
-----   Message from Leonie Man, MD sent at 03/02/2020  1:22 PM EDT ----- Chemistry panel shows glucose level still elevated--need to evaluate and treat possible Diabetes. == > Hemoglobin A1c is 6.9 which does meet criteria for diabetes.  Really need to make sure that he has a primary care physician.  Otherwise normal chemistry levels normal kidney and liver function. Totally normal CBC-blood counts. Cholesterol panel also looks great.  Cholesterol 111, LDL 59.  HDL is a little on the low side at 35, but this is probably because the total cholesterol is so low.  TSH is normal.  New diagnosis of diabetes, I would recommend we start Metformin 500 mg twice daily.  He needs to establish primary care doctor to evaluate and treat this further.  Glenetta Hew, MD

## 2020-03-11 ENCOUNTER — Other Ambulatory Visit: Payer: Self-pay | Admitting: Cardiology

## 2020-03-21 ENCOUNTER — Telehealth: Payer: Self-pay | Admitting: Cardiology

## 2020-03-21 NOTE — Telephone Encounter (Signed)
Called [facility] to schedule patient's appointment to establish with a primary care provider.  Office states the patient will need to call to schedule his appointment.  Left message for patient to call me so I can relay this information.

## 2020-03-21 NOTE — Telephone Encounter (Signed)
Patient returned my call and states he is going to try and establish with his wife's PCP---it he cannot---he will call [facility] to schedule

## 2020-05-25 DIAGNOSIS — Z8616 Personal history of COVID-19: Secondary | ICD-10-CM

## 2020-05-25 HISTORY — DX: Personal history of COVID-19: Z86.16

## 2020-06-02 ENCOUNTER — Other Ambulatory Visit: Payer: Self-pay | Admitting: Cardiology

## 2020-06-30 ENCOUNTER — Other Ambulatory Visit: Payer: Self-pay | Admitting: Cardiology

## 2020-07-01 ENCOUNTER — Other Ambulatory Visit: Payer: Self-pay | Admitting: Cardiology

## 2020-08-02 ENCOUNTER — Other Ambulatory Visit: Payer: Self-pay | Admitting: Cardiology

## 2020-09-29 ENCOUNTER — Other Ambulatory Visit: Payer: Self-pay | Admitting: Cardiology

## 2020-10-10 ENCOUNTER — Other Ambulatory Visit: Payer: Self-pay | Admitting: Cardiology

## 2020-11-29 ENCOUNTER — Other Ambulatory Visit: Payer: Self-pay | Admitting: Cardiology

## 2021-01-05 ENCOUNTER — Other Ambulatory Visit: Payer: Self-pay | Admitting: Cardiology

## 2021-01-06 ENCOUNTER — Other Ambulatory Visit: Payer: Self-pay | Admitting: Cardiology

## 2021-01-08 ENCOUNTER — Other Ambulatory Visit: Payer: Self-pay | Admitting: Cardiology

## 2021-01-08 NOTE — Telephone Encounter (Signed)
Rx(s) sent to pharmacy electronically.  

## 2021-02-09 ENCOUNTER — Other Ambulatory Visit: Payer: Self-pay | Admitting: Cardiology

## 2021-02-10 ENCOUNTER — Other Ambulatory Visit: Payer: Self-pay | Admitting: Cardiology

## 2021-02-11 ENCOUNTER — Other Ambulatory Visit: Payer: Self-pay | Admitting: Cardiology

## 2021-03-13 ENCOUNTER — Other Ambulatory Visit: Payer: Self-pay | Admitting: Cardiology

## 2021-04-24 DIAGNOSIS — C61 Malignant neoplasm of prostate: Secondary | ICD-10-CM

## 2021-04-24 HISTORY — DX: Malignant neoplasm of prostate: C61

## 2021-05-12 ENCOUNTER — Other Ambulatory Visit: Payer: Self-pay | Admitting: Cardiology

## 2021-05-18 ENCOUNTER — Other Ambulatory Visit: Payer: Self-pay | Admitting: Cardiology

## 2021-06-04 ENCOUNTER — Other Ambulatory Visit (HOSPITAL_COMMUNITY): Payer: Self-pay | Admitting: Urology

## 2021-06-04 DIAGNOSIS — C61 Malignant neoplasm of prostate: Secondary | ICD-10-CM

## 2021-06-06 ENCOUNTER — Other Ambulatory Visit: Payer: Self-pay

## 2021-06-06 ENCOUNTER — Ambulatory Visit: Payer: Medicare Other | Admitting: Cardiology

## 2021-06-06 ENCOUNTER — Encounter: Payer: Self-pay | Admitting: Cardiology

## 2021-06-06 VITALS — BP 132/64 | HR 88 | Ht 72.0 in | Wt 208.0 lb

## 2021-06-06 DIAGNOSIS — I2510 Atherosclerotic heart disease of native coronary artery without angina pectoris: Secondary | ICD-10-CM | POA: Diagnosis not present

## 2021-06-06 DIAGNOSIS — E785 Hyperlipidemia, unspecified: Secondary | ICD-10-CM

## 2021-06-06 DIAGNOSIS — I2119 ST elevation (STEMI) myocardial infarction involving other coronary artery of inferior wall: Secondary | ICD-10-CM

## 2021-06-06 DIAGNOSIS — Z9861 Coronary angioplasty status: Secondary | ICD-10-CM

## 2021-06-06 DIAGNOSIS — R739 Hyperglycemia, unspecified: Secondary | ICD-10-CM

## 2021-06-06 DIAGNOSIS — R5383 Other fatigue: Secondary | ICD-10-CM

## 2021-06-06 DIAGNOSIS — I10 Essential (primary) hypertension: Secondary | ICD-10-CM | POA: Diagnosis not present

## 2021-06-06 NOTE — Patient Instructions (Signed)
Medication Instructions:  NO CHANGES  *If you need a refill on your cardiac medications before your next appointment, please call your pharmacy*   Lab Work: CMP Lipid fasting If you have labs (blood work) drawn today and your tests are completely normal, you will receive your results only by: [place] (if you have MyChart) OR A paper copy in the mail If you have any lab test that is abnormal or we need to change your treatment, we will call you to review the results.   Testing/Procedures:  NOT NEEDED  Follow-Up: At [facility], you and your health needs are our priority.  As part of our continuing mission to provide you with exceptional heart care, we have created designated Provider Care Teams.  These Care Teams include your primary Cardiologist (physician) and Advanced Practice Providers (APPs -  Physician Assistants and Nurse Practitioners) who all work together to provide you with the care you need, when you need it.     Your next appointment:   12 month(s)  The format for your next appointment:   In Person  Provider:   None

## 2021-06-06 NOTE — Progress Notes (Signed)
Primary Care Provider: Maude Leriche, PA-C Cardiologist: None Electrophysiologist: None  Clinic Note: Chief Complaint  Patient presents with   Follow-up    Delayed annual follow-up   Coronary Artery Disease    No active angina.   ===================================  ASSESSMENT/PLAN   Problem List Items Addressed This Visit       Cardiology Problems   CAD S/P percutaneous coronary angioplasty - Primary (Chronic)    First cardiac episode was in 2006 and then an inferior STEMI in 2010.  Most recent Myoview was in October 2013 which was negative for symptomatic standpoint and no ischemia.  But there was a significant inferior-inferolateral infarct.  This was previously noted as diaphragmatic attenuation, probably was infarct.  No active angina symptoms.  Stable only notes some mild fatigue which is 1 reason for not increasing beta-blocker dose. Able to achieve more than 5-6 METS with only mild exertional dyspnea but no chest pain.  Plan: For now follow-up from the infarct he is only on aspirin without Thienopyridine.   Would be okay to hold for procedures or surgeries. He is on low-dose beta-blocker which is high as he can can tolerate due to his fatigue issues. If he becomes symptomatic with the PVCs and PACs noted on exam, would have a low threshold to increase Toprol dose. On ARB and amlodipine for afterload reduction antianginal benefit. Excellent control lipids on statin..      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  Mild to moderate inferior scar noted on Myoview Allernix EF but no further angina or heart failure symptoms. He does have PVCs on EKG that have been present for a while now.  He is on a beta-blocker.  Has been doing great with no major  symptoms.      Relevant Orders   EKG 12-Lead (Completed)   Lipid panel (Completed)   Comprehensive metabolic panel (Completed)   Essential hypertension (Chronic)    Blood pressure is well controlled current meds.  No changes. Plan: Continue combination of valsartan HCTZ in the morning along with Toprol 12.5 mg every morning along with amlodipine 10 mg.  Has been fearful of titrating beta-blocker because of issues of fatigue and bradycardia with higher doses.      Relevant Orders   Comprehensive metabolic panel (Completed)   Dyslipidemia, goal LDL below 70 (Chronic)    He is fasting today.  We checked labs and LDL was 66.  Well-controlled on current dose of rosuvastatin 20 mg daily.  No myalgias.  No change.  Okay to recheck in a year.      Relevant Orders   EKG 12-Lead (Completed)   Lipid panel (Completed)   Comprehensive metabolic panel (Completed)     Other   Hyperglycemia, unspecified    Blood sugars been high on screening lab work.  A1c as of October 2021 was 6.9.  I do not see that he has been treated by.  I suspect that he probably should be on at a minimum metformin which had been listed as of August of last year, but is not listed today.  Defer to PCP for now, but will consider SGLT2 inhibitor for additional cardiovascular benefit.      Fatigue due to treatment (Chronic)    Fatigue is better with lower dose of  beta-blocker, but still present.  Was not anemic during last check.  Thyroid levels normal.       ===================================  HPI:    Chase Combs is a 76 y.o. male with a CAD History below who presents today for delayed annual follow-up.  CAD HISTORY 2006 (chest pain) --> BMS PCI of RI w/ Co-Star BMS (2.5 x 16)  (Dr. Melvern Combs).  Inf STEMI 01/2009 -->  occluded RCA was treated with thrombectomy and 3.5 mm x 20 mm vision BMS stent placed and postdilated to 4 mm) (Dr. Angelena Combs) Last stress test was in October 2013 that showed no evidence of  ischemia but evidence of an inferolateral & inferobasilar wall infarct.  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  Chase Combs was last seen on February 15, 2020: He was doing fairly well.  Noted that he gets short of breath because of stairs rapidly, or carrying something.  But not on flat ground.  Still having difficulty using CPAP at night.  If he uses it, he is able to sleep okay, but just has a hard time with comfort.  He has to get up to go to the bathroom and makes it very difficult.  Still working 40 to 50 hours a week at with his [employer] business.  No longer using CDL license.  Dizzy bending over.  Still has occasional irregular heartbeats and palpitations with very infrequent rapid heart rates.  No heart failure symptoms.  Recent Hospitalizations: None  Reviewed  CV studies:    The following studies were reviewed today: (if available, images/films reviewed: From Epic Chart or Care Everywhere) None:  Interval History:   Chase Combs presents for delayed annual follow-up overall doing okay.  He still limited more by his knee pain.  No longer make any big trips he still has a his [employer], but not as active as he used to be.  He says he feels tired a lot, has less get up and go, but this is pretty much stable.  Maybe a little better with reduced dose of beta-blocker.  He says he can get short of breath if he walks longer distances or walks too fast.  He is quite deconditioned.  He has not had any chest pain or pressure with rest or exertion.  No PND orthopnea and only trivial edema. He still has his dizziness when he bends over, with occasional indigestion and gas symptoms.  Limited sleep because of frequent nocturia.  CV Review of Symptoms (Summary) Cardiovascular ROS: positive for - dyspnea on exertion, irregular heartbeat, palpitations, and some positional dizziness-bending over  (chronic) negative for - chest pain, edema, orthopnea, paroxysmal nocturnal dyspnea, rapid heart rate, shortness of breath, or lightheadedness or dizziness unless bending over; syncope/near syncope or TIA/amaurosis fugax, claudication.  REVIEWED OF SYSTEMS   Review of Systems  Constitutional:  Positive for malaise/fatigue (He gets tired a lot.  Less get up and go that he used to have.  This has been a gradual thing that is been happening over the last couple years.). Negative for weight loss.  HENT:  Negative for congestion and nosebleeds.   Respiratory:  Positive for shortness of breath (Only exertional). Negative for cough and wheezing.   Cardiovascular:        Per HPI  Gastrointestinal:  Positive for heartburn (Occasional gas/indigestion and bloating.). Negative for abdominal pain, blood in stool and melena.  Genitourinary:  Positive for frequency (Frequent nocturia.  Makes it hard for him to sleep.). Negative for dysuria and hematuria.  Musculoskeletal:  Positive for joint pain (Bilateral knees). Negative for myalgias.  Neurological:  Positive for dizziness (With bending over.) and headaches. Negative for tingling, speech change, seizures, loss of consciousness and weakness.  Psychiatric/Behavioral:  Negative for depression and memory loss. The patient has insomnia. The patient is not nervous/anxious.   -> Recently diagnosed with prostate cancer with treatment plan pending.  I have reviewed and (if needed) personally updated the patient's problem list, medications, allergies, past medical and surgical history, social and family history.   PAST MEDICAL HISTORY   Past Medical History:  Diagnosis Date   Arthritis    CAD S/P percutaneous coronary angioplasty 06/18/2004; 02/16/2009, 02/2012   a) Angina: CoStar Study Stent 2.5 mm x 16 mm - prox RI; b) DES PCI RCA for Inf STEMI; c) TM Myoview: 7:20, 8 METS, Mild to mod scar with HK in basal-mid inferior and basal inferolateral wall.;  No  ischemia; EF 51%   Dyslipidemia    Essential hypertension    Hyperlipidemia with target LDL less than 70    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([place]) 06/17/2021   Multiple allergies    S/p ST elevation myocardial infarction (STEMI) of inferior wall 02/16/2009   Occluded RCA --> PCI after thrombectomy 3.5 mm x 20 mm Vision BMs as (4.0 mm)    PAST SURGICAL HISTORY   Past Surgical History:  Procedure Laterality Date   CAROTID STENT  01/2009   CORONARY ANGIOPLASTY WITH STENT PLACEMENT  January 2006   PCI of the proximal Ramus Intermedius -- CoStar study stent 2.5 mm x 16 mm   CORONARY ANGIOPLASTY WITH STENT PLACEMENT  September 2010   Inferior STEMI: Proximal RCA 3.5 mm 23 mm vision BMS (4.0 mm)   ERCP W/ METAL STENT PLACEMENT  02/16/2009   GXT  07/2014   Target HR in Stage 3 - no CP. Limited by SOB. Up-sloping ST elevation. Non-ischemic   NM MYOVIEW LTD  03/15/2012   7:20, 8 METS, Mild to mod scar with HK in basal-mid inferior and basal inferolateral wall.;  No ischemia; EF 51%    Immunization History  Administered Date(s) Administered   Tdap 10/08/2014    MEDICATIONS/ALLERGIES   Current Meds  Medication Sig   amLODipine (NORVASC) 10 MG tablet TAKE 1 TABLET BY MOUTH EVERY DAY   amoxicillin (AMOXIL) 500 MG capsule TK 4 CS PO 1 HR PRIOR TO DENTAL APPOINTMENT (Patient not taking: Reported on 06/17/2021)   aspirin EC 81 MG tablet Take 1 tablet (81 mg total) by mouth daily.   finasteride (PROSCAR) 5 MG tablet Take 1 tablet by mouth daily.   hydrochlorothiazide (HYDRODIURIL) 25 MG tablet TAKE 1 TABLET(25 MG) BY MOUTH DAILY   hydrocortisone 2.5 % lotion Apply topically 2 (two) times daily.   metoprolol succinate (TOPROL-XL) 25 MG 24 hr tablet TAKE 1/2 TABLET BY MOUTH EVERY DAY   Multiple Vitamin (MULTIVITAMIN) tablet Take 1 tablet by mouth daily.   rosuvastatin (CRESTOR) 20 MG tablet TAKE 1 TABLET(20 MG) BY MOUTH DAILY   valsartan (DIOVAN) 320 MG tablet TAKE 1 TABLET BY MOUTH EVERY DAY     Allergies  Allergen Reactions   Codeine Nausea And Vomiting    SOCIAL HISTORY/FAMILY HISTORY   Reviewed in Epic:  Pertinent findings:  Social History   Tobacco Use   Smoking status: Never   Smokeless tobacco: Never  Substance Use Topics  Alcohol use: No    Alcohol/week: 0.0 standard drinks   Drug use: No   Social History   Social History Narrative   He is married, father of 24, grandfather of 4. Never smoked. Does not drink.    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    He does lots of lifting, pulling with ropes, using upper extremities.       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    OBJCTIVE -PE, EKG, labs   Wt Readings from Last 3 Encounters:  06/17/21 208 lb 12.8 oz (94.7 kg)  06/06/21 208 lb (94.3 kg)  02/15/20 211 lb (95.7 kg)    Physical Exam: BP 132/64    Pulse 88    Ht 6' (1.829 m)    Wt 208 lb (94.3 kg)    SpO2 96%    BMI 28.21 kg/m  Physical Exam Constitutional:      General: He is not in acute distress.    Appearance: Normal appearance. He is normal weight. He is not toxic-appearing.     Comments: Well-nourished and well-groomed.  Mildly overweight.  Otherwise healthy-appearing.  HENT:     Head: Normocephalic and atraumatic.  Neck:     Vascular: No carotid bruit or JVD.  Cardiovascular:     Rate and Rhythm: Normal rate and regular rhythm. FrequentExtrasystoles are present.    Pulses: Normal pulses.     Heart sounds: Normal heart sounds, S1 normal and S2 normal. No murmur heard.   No friction rub. No gallop.  Pulmonary:     Effort: Pulmonary effort is normal. No respiratory distress.     Breath sounds: Normal breath sounds. No wheezing, rhonchi or rales.  Chest:     Chest wall: No tenderness.  Musculoskeletal:        General: No  swelling. Normal range of motion.     Cervical back: Normal range of motion and neck supple.  Skin:    General: Skin is warm and dry.  Neurological:     General: No focal deficit present.     Mental Status: He is alert and oriented to person, place, and time.  Psychiatric:        Mood and Affect: Mood normal.        Behavior: Behavior normal.        Thought Content: Thought content normal.        Judgment: Judgment normal.     Adult ECG Report  Rate: 88;  Rhythm: normal sinus rhythm, sinus arrhythmia, premature atrial contractions (PAC), premature ventricular contractions (PVC), and biatrial enlargement, fusion/aberrantly conducted PACs versus PVCs. ; LAFB.  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  Recent Labs: Labs ordered on day of visit.  Reviewed Lab Results  Component Value Date   CHOL 122 06/06/2021   HDL 40 06/06/2021   LDLCALC 66 06/06/2021   TRIG 81 06/06/2021   CHOLHDL 3.1 06/06/2021   Lab Results  Component Value Date   CREATININE 0.76 06/06/2021   BUN 16 06/06/2021   NA 142 06/06/2021   K 4.3 06/06/2021   CL 102 06/06/2021   CO2 25 06/06/2021   CBC Latest Ref Rng & Units 03/01/2020 12/03/2017 06/01/2012  WBC 3.4 - 10.8 x10E3/uL  6.2 7.1 7.4  Hemoglobin 13.0 - 17.7 g/dL 15.3 15.1 15.3  Hematocrit 37.5 - 51.0 % 45.5 43.9 49.0  Platelets 150 - 450 x10E3/uL 282 337 -    Lab Results  Component Value Date   HGBA1C 6.9 (H) 03/01/2020   Lab Results  Component Value Date   TSH 2.060 03/01/2020    ==================================================  COVID-19 Education: The signs and symptoms of COVID-19 were discussed with the patient and how to seek care for testing (follow up with PCP or arrange E-visit).    I spent a total of 20 minutes with the patient spent in direct patient consultation.  Additional time spent with chart review  / charting (studies, outside notes, etc): 12 min Total Time: 32 min  Current medicines  are reviewed at length with the patient today.  (+/- concerns) none  This visit occurred during the SARS-CoV-2 public health emergency.  Safety protocols were in place, including screening questions prior to the visit, additional usage of staff PPE, and extensive cleaning of exam room while observing appropriate contact time as indicated for disinfecting solutions.  Notice: This dictation was prepared with Dragon dictation along with smart phrase technology. Any transcriptional errors that result from this process are unintentional and may not be corrected upon review.  Studies Ordered:   Orders Placed This Encounter  Procedures   Lipid panel   Comprehensive metabolic panel   EKG 56-DJSH    Patient Instructions / Medication Changes & Studies & Tests Ordered   Patient Instructions  Medication Instructions:  NO CHANGES  *If you need a refill on your cardiac medications before your next appointment, please call your pharmacy*   Lab Work: CMP Lipid fasting If you have labs (blood work) drawn today and your tests are completely normal, you will receive your results only by: MyChart Message (if you have MyChart) OR A paper copy in the mail If you have any lab test that is abnormal or we need to change your treatment, we will call you to review the results.   Testing/Procedures:  NOT NEEDED  Follow-Up: At [facility], you and your health needs are our priority.  As part of our continuing mission to provide you with exceptional heart care, we have created designated Provider Care Teams.  These Care Teams include your primary Cardiologist (physician) and Advanced Practice Providers (APPs -  Physician Assistants and Nurse Practitioners) who all work together to provide you with the care you need, when you need it.     Your next appointment:   12 month(s)  The format for your next appointment:   In Person  Provider:   None      Glenetta Hew, M.D., M.S. Interventional  Cardiologist   Pager # [phone] Phone # [phone] [address] [place], [place] [address]   Thank you for choosing Heartcare at [facility]!!

## 2021-06-07 LAB — COMPREHENSIVE METABOLIC PANEL
ALT: 27 IU/L (ref 0–44)
AST: 21 IU/L (ref 0–40)
Albumin/Globulin Ratio: 2.1 (ref 1.2–2.2)
Albumin: 4.6 g/dL (ref 3.7–4.7)
Alkaline Phosphatase: 83 IU/L (ref 44–121)
BUN/Creatinine Ratio: 21 (ref 10–24)
BUN: 16 mg/dL (ref 8–27)
Bilirubin Total: 0.5 mg/dL (ref 0.0–1.2)
CO2: 25 mmol/L (ref 20–29)
Calcium: 9.7 mg/dL (ref 8.6–10.2)
Chloride: 102 mmol/L (ref 96–106)
Creatinine, Ser: 0.76 mg/dL (ref 0.76–1.27)
Globulin, Total: 2.2 g/dL (ref 1.5–4.5)
Glucose: 108 mg/dL — ABNORMAL HIGH (ref 70–99)
Potassium: 4.3 mmol/L (ref 3.5–5.2)
Sodium: 142 mmol/L (ref 134–144)
Total Protein: 6.8 g/dL (ref 6.0–8.5)
eGFR: 95 mL/min/{1.73_m2} (ref >59)

## 2021-06-07 LAB — LIPID PANEL
Chol/HDL Ratio: 3.1 ratio (ref 0.0–5.0)
Cholesterol, Total: 122 mg/dL (ref 100–199)
HDL: 40 mg/dL (ref >39)
LDL Chol Calc (NIH): 66 mg/dL (ref 0–99)
Triglycerides: 81 mg/dL (ref 0–149)
VLDL Cholesterol Cal: 16 mg/dL (ref 5–40)

## 2021-06-09 NOTE — Progress Notes (Signed)
I called pt to introduce myself as the Prostate Nurse Navigator and the Coordinator of the Prostate [place].   1. I confirmed with the patient he is aware of his referral to the clinic 1/24, arriving @ 12:30 pm.   2. I discussed the format of the clinic and the physicians he will be seeing that day.   3. I discussed where the clinic is located and how to contact me.   4. I confirmed his address and informed him I would be mailing a packet of information and forms to be completed. I asked him to bring them with him the day of his appointment.    He voiced understanding of the above. I asked him to call me if he has any questions or concerns regarding his appointments or the forms he needs to complete.

## 2021-06-12 ENCOUNTER — Ambulatory Visit (HOSPITAL_COMMUNITY)
Admission: RE | Admit: 2021-06-12 | Discharge: 2021-06-12 | Disposition: A | Discharge status: Home / Self Care | Payer: Medicare Other | Source: Ambulatory Visit | Attending: Urology | Admitting: Urology

## 2021-06-12 ENCOUNTER — Other Ambulatory Visit: Payer: Self-pay

## 2021-06-12 DIAGNOSIS — C61 Malignant neoplasm of prostate: Secondary | ICD-10-CM | POA: Insufficient documentation

## 2021-06-12 MED ORDER — PIFLIFOLASTAT F 18 (PYLARIFY) INJECTION
9.0000 | Freq: Once | INTRAVENOUS | Status: AC
  Administered 2021-06-12: 9.6 via INTRAVENOUS

## 2021-06-16 NOTE — Progress Notes (Signed)
Left message for patient reminding of upcoming PMDC information.  Contact information left for call back if needed.

## 2021-06-16 NOTE — Progress Notes (Addendum)
° °                              Care Plan Summary  Name: Cyan Moultrie DOB: [birth year]   Your Medical Team:   Urologist -  Dr. Raynelle Bring, [facility]  Radiation Oncologist - Dr. Tyler Pita, [facility]   Medical Oncologist - Dr. Zola Button, [facility]  Recommendations: 1) Long Term Androgen Depravation Therapy  2) Radiation  3) Possible systemic therapy (2-3 months after initiation of ADT)   * These recommendations are based on information available as of todays consult.  Recommendations may change depending on the results of further tests or exams.    Next Steps: 1) [facility] will contact you to set up start of Androgen Depravatio n Therapy  2) Dr. Johny Shears office will contact you with next steps in radiation.   When appointments need to be scheduled, you will be contacted by [facility] and/or [facility].  Questions?  Please do not hesitate to call Katheren Puller, BSN, RN at [phone] with any questions or concerns.  Kathlee Nations is your Oncology Nurse Navigator and is available to assist you while youre receiving your medical care at [facility].

## 2021-06-17 ENCOUNTER — Ambulatory Visit
Admission: RE | Admit: 2021-06-17 | Discharge: 2021-06-17 | Disposition: A | Discharge status: Home / Self Care | Payer: Medicare Other | Source: Ambulatory Visit | Attending: Radiation Oncology | Admitting: Radiation Oncology

## 2021-06-17 ENCOUNTER — Inpatient Hospital Stay (HOSPITAL_BASED_OUTPATIENT_CLINIC_OR_DEPARTMENT_OTHER): Payer: Medicare Other | Admitting: Oncology

## 2021-06-17 ENCOUNTER — Other Ambulatory Visit: Payer: Self-pay | Admitting: Genetic Counselor

## 2021-06-17 ENCOUNTER — Inpatient Hospital Stay: Payer: Medicare Other | Attending: Oncology

## 2021-06-17 ENCOUNTER — Encounter: Payer: Self-pay | Admitting: Radiation Oncology

## 2021-06-17 ENCOUNTER — Encounter: Payer: Self-pay | Admitting: Genetic Counselor

## 2021-06-17 ENCOUNTER — Ambulatory Visit (HOSPITAL_BASED_OUTPATIENT_CLINIC_OR_DEPARTMENT_OTHER): Payer: Medicare Other | Admitting: Genetic Counselor

## 2021-06-17 ENCOUNTER — Other Ambulatory Visit: Payer: Self-pay

## 2021-06-17 VITALS — BP 110/83 | HR 72 | Temp 97.7°F | Resp 20 | Ht 72.0 in | Wt 208.8 lb

## 2021-06-17 DIAGNOSIS — Z7982 Long term (current) use of aspirin: Secondary | ICD-10-CM | POA: Diagnosis not present

## 2021-06-17 DIAGNOSIS — I10 Essential (primary) hypertension: Secondary | ICD-10-CM | POA: Insufficient documentation

## 2021-06-17 DIAGNOSIS — C61 Malignant neoplasm of prostate: Secondary | ICD-10-CM | POA: Insufficient documentation

## 2021-06-17 DIAGNOSIS — Z1379 Encounter for other screening for genetic and chromosomal anomalies: Secondary | ICD-10-CM

## 2021-06-17 DIAGNOSIS — E876 Hypokalemia: Secondary | ICD-10-CM | POA: Insufficient documentation

## 2021-06-17 DIAGNOSIS — Z79899 Other long term (current) drug therapy: Secondary | ICD-10-CM | POA: Diagnosis not present

## 2021-06-17 HISTORY — DX: Malignant neoplasm of prostate: C61

## 2021-06-17 LAB — GENETIC SCREENING ORDER

## 2021-06-17 NOTE — Progress Notes (Signed)
Radiation Oncology         [phone] ________________________________  Multidisciplinary Prostate Cancer Clinic  Initial Radiation Oncology Consultation  Name: Chase Combs MRN: [id number]  Date: 06/17/2021  DOB: Apr 08, 1946  OI:[id number], Rolland Porter, MD   REFERRING PHYSICIAN: Raynelle Bring, MD  DIAGNOSIS: 76 y.o. 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([place])  [place] ILLNESS::Chase Combs is a 76 y.o. gentleman. He had previously been followed by Dr. Gaynelle Arabian for an elevated PSA. Prior prostate biopsy in 03/2007 was benign. Since that time, he continued with close monitoring of the PSA and eventually switched his care to Dr. Alinda Money with Dr. Arlyn Leak retirement. He has been on finasteride since 04/2017 for management of his LUTS.  His PSA increased to 7.66 in January 2021 so a prostate MRI was performed on 06/15/19 and did not reveal any concerning findings.  His PSA subsequently decreased to 5.92 in April 2021 and 5.39 in August 2021.  More recently, his PSA increased to 7.95 in February 2022 and up to 12.3 (24.6 adjusted for finasteride) in 02/2021; prior PSA had fluctuated but never gotten above 8. The patient proceeded to transrectal ultrasound with 12 biopsies of the prostate on 05/09/21.  The prostate volume measured 94.6 cc.  Out of 12 core biopsies, 5 were positive, all on the left side.  The maximum Gleason score was 4+5, and this was seen in the mid and mid lateral. Additionally, Gleason 4+4 was seen in the base, base lateral, and apex lateral (small focus).           He underwent PSMA scan on 06/12/21 for disease staging and this showed focal intense radiotracer activity within the left gland peripheral zone, consistent with primary prostate adenocarcinoma.  Additionally, two small (5  mm) left common iliac and a single left periaortic lymph node were noted to have increased radiotracer activity but without evidence of visceral or skeletal metastasis.    The patient reviewed the biopsy and imaging results with his urologist and he has kindly been referred today to the multidisciplinary prostate cancer clinic for presentation of pathology and radiology studies in our conference for discussion of potential radiation treatment options and clinical evaluation.  PREVIOUS RADIATION THERAPY: No  PAST MEDICAL HISTORY:  has a past medical history of Arthritis, CAD S/P percutaneous coronary angioplasty (06/18/2004; 02/16/2009, 02/2012), Dyslipidemia, Essential hypertension, Hyperlipidemia with target LDL less than 70, 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([place]) (06/17/2021), Multiple allergies, and S/p ST elevation myocardial infarction (STEMI) of inferior wall (02/16/2009).    PAST SURGICAL HISTORY: Past Surgical History:  Procedure Laterality Date   CAROTID STENT  01/2009   CORONARY ANGIOPLASTY WITH STENT PLACEMENT  January 2006   PCI of the proximal Ramus Intermedius -- CoStar study stent 2.5 mm x 16 mm   CORONARY ANGIOPLASTY WITH STENT PLACEMENT  September 2010   Inferior STEMI: Proximal RCA 3.5 mm 23 mm vision BMS (4.0 mm)   ERCP W/ METAL STENT PLACEMENT  02/16/2009   GXT  07/2014   Target HR in Stage 3 - no CP. Limited by SOB. Up-sloping ST elevation. Non-ischemic   NM MYOVIEW LTD  03/15/2012   7:20, 8 METS, Mild to mod scar with HK in basal-mid inferior and basal inferolateral wall.;  No ischemia; EF 51%  FAMILY HISTORY: family history includes Cancer in his father and mother.  SOCIAL HISTORY:  reports that he has never smoked. He has never used smokeless tobacco. He reports that he does not drink alcohol and does not use drugs.  ALLERGIES: Codeine  MEDICATIONS:  Current Outpatient Medications  Medication Sig Dispense Refill   amLODipine (NORVASC) 10 MG tablet TAKE 1 TABLET BY  MOUTH EVERY DAY 90 tablet 3   amoxicillin (AMOXIL) 500 MG capsule TK 4 CS PO 1 HR PRIOR TO DENTAL APPOINTMENT     aspirin EC 81 MG tablet Take 1 tablet (81 mg total) by mouth daily. 90 tablet 3   finasteride (PROSCAR) 5 MG tablet Take 1 tablet by mouth daily.     hydrochlorothiazide (HYDRODIURIL) 25 MG tablet TAKE 1 TABLET(25 MG) BY MOUTH DAILY 90 tablet 0   hydrocortisone 2.5 % lotion Apply topically 2 (two) times daily. 59 mL 0   metoprolol succinate (TOPROL-XL) 25 MG 24 hr tablet TAKE 1/2 TABLET BY MOUTH EVERY DAY 90 tablet 3   Multiple Vitamin (MULTIVITAMIN) tablet Take 1 tablet by mouth daily.     rosuvastatin (CRESTOR) 20 MG tablet TAKE 1 TABLET(20 MG) BY MOUTH DAILY 90 tablet 1   valsartan (DIOVAN) 320 MG tablet TAKE 1 TABLET BY MOUTH EVERY DAY 90 tablet 3   No current facility-administered medications for this encounter.    REVIEW OF SYSTEMS:  On review of systems, the patient reports that he is doing well overall. He denies any chest pain, shortness of breath, cough, fevers, chills, night sweats, unintended weight changes. He denies any bowel disturbances, and denies abdominal pain, nausea or vomiting. He denies any new musculoskeletal or joint aches or pains. His IPSS was 14, indicating moderate urinary symptoms despite being on the finasteride for several years. His SHIM was 13, indicating he has moderate erectile dysfunction. A complete review of systems is obtained and is otherwise negative.   PHYSICAL EXAM:  Wt Readings from Last 3 Encounters:  06/06/21 208 lb (94.3 kg)  02/15/20 211 lb (95.7 kg)  02/10/19 206 lb 3.2 oz (93.5 kg)   Temp Readings from Last 3 Encounters:  02/10/19 97.9 F (36.6 C)  09/08/16 98.4 F (36.9 C) (Oral)  10/21/14 99 F (37.2 C) (Oral)   BP Readings from Last 3 Encounters:  06/06/21 132/64  02/15/20 132/68  02/10/19 113/68   Pulse Readings from Last 3 Encounters:  06/06/21 88  02/15/20 81  02/10/19 61    /10  In general this is a well  appearing Caucasian male in no acute distress. He's alert and oriented x4 and appropriate throughout the examination. Cardiopulmonary assessment is negative for acute distress and he exhibits normal effort.    KPS = 100  100 - Normal; no complaints; no evidence of disease. 90   - Able to carry on normal activity; minor signs or symptoms of disease. 80   - Normal activity with effort; some signs or symptoms of disease. 40   - Cares for self; unable to carry on normal activity or to do active work. 60   - Requires occasional assistance, but is able to care for most of his personal needs. 50   - Requires considerable assistance and frequent medical care. 25   - Disabled; requires special care and assistance. 70   - Severely disabled; hospital admission is indicated although death not imminent. 42   - Very sick; hospital admission necessary; active supportive treatment necessary. 10   - Moribund; fatal processes  progressing rapidly. 0     - Dead  Karnofsky DA, Abelmann Callaway, Craver LS and Burchenal [facility] [phone]) The use of the nitrogen mustards in the palliative treatment of carcinoma: with particular reference to bronchogenic carcinoma Cancer 1 [id number]   LABORATORY DATA:  Lab Results  Component Value Date   WBC 6.2 03/01/2020   HGB 15.3 03/01/2020   HCT 45.5 03/01/2020   MCV 93 03/01/2020   PLT 282 03/01/2020   Lab Results  Component Value Date   NA 142 06/06/2021   K 4.3 06/06/2021   CL 102 06/06/2021   CO2 25 06/06/2021   Lab Results  Component Value Date   ALT 27 06/06/2021   AST 21 06/06/2021   ALKPHOS 83 06/06/2021   BILITOT 0.5 06/06/2021     RADIOGRAPHY: NM PET (PSMA) SKULL TO MID THIGH  Result Date: 06/13/2021 CLINICAL DATA:  Prostate carcinoma with biochemical recurrence. PSA equal 12.3 EXAM: NUCLEAR MEDICINE PET SKULL BASE TO THIGH TECHNIQUE: 9.6 mCi F18 Piflufolastat (Pylarify) was injected intravenously. Full-ring PET imaging was performed from the skull base to thigh  after the radiotracer. CT data was obtained and used for attenuation correction and anatomic localization. COMPARISON:  None. FINDINGS: NECK No radiotracer activity in neck lymph nodes. Incidental CT finding: None CHEST No radiotracer accumulation within mediastinal or hilar lymph nodes. No suspicious pulmonary nodules on the CT scan. Incidental CT finding: None ABDOMEN/PELVIS Prostate: Within the posterior aspect of the LEFT gland peripheral zone, there is a focus of intense radiotracer activity SUV max 19.4. This lesion measures approximately 2 cm x 1 cm. Lymph nodes: Small radiotracer avid LEFT common iliac lymph node measures only 5 mm (image 181/4) but has significant radiotracer activity for size with SUV max equal 5.6 (image 181). Second adjacent similar node on image 176 closer to the bifurcation with SUV max equal 4.0. Small LEFT periaortic lymph node measuring only 4 mm (image 164/4) has faint radiotracer activity SUV max equal 2.9 on image 164. Liver: No evidence of liver metastasis Incidental CT finding: Benign cyst in the mid RIGHT kidney LEFT colon diverticulosis SKELETON No focal  activity to suggest skeletal metastasis. IMPRESSION: 1. Focal intense radiotracer activity within the LEFT gland peripheral zone consistent primary prostate adenocarcinoma. 2. Two small (5 mm) LEFT common iliac and a single LEFT periaortic lymph node have radiotracer activity and are most consistent with prostate cancernodal metastasis 3. No evidence of visceral metastasis skeletal metastasis. Electronically Signed   By: Suzy Bouchard M.D.   On: 06/13/2021 12:14      IMPRESSION/PLAN: 76 y.o. 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 The patient's T stage, Gleason's score, and PSA put him into the high risk group. Accordingly, he is  eligible for a variety of potential treatment options including prostatectomy or LT-ADT in combination with either 8 weeks of external radiation or 5 weeks of external radiation with an upfront brachytherapy boost. We discussed the available radiation techniques, and focused on the details and logistics of delivery. The patient is not an ideal candidate for brachytherapy boost with a prostate volume of 94.6 cc.  We discussed and outlined the risks, benefits, short and long-term effects associated with radiotherapy and compared and contrasted these with prostatectomy. We discussed the role of SpaceOAR gel in reducing the rectal toxicity associated with radiotherapy.  We also detailed the role of ADT in the treatment of high risk prostate cancer and outlined the associated side effects that could be expected with this therapy. He was encouraged to ask questions that were answered to his/their stated satisfaction.  At the end of the conversation the patient is interested in moving forward with 8 weeks of external beam therapy in combination with ADT. We will share our discussion with Dr. Alinda Money and make arrangements for the start of ADT, first available.we will also coordinate for fiducial markers and SpaceOAR gel placement in April 2023, prior to simulation, to reduce rectal toxicity from radiotherapy. The patient appears to have a good understanding of his disease and our treatment recommendations which are of curative intent and is in agreement with the stated plan.  Therefore, we will move forward with treatment planning accordingly, in anticipation of beginning IMRT approximately 2 months after starting ADT.   We personally spent 60 minutes in this encounter including chart review, reviewing radiological studies, meeting face-to-face with the patient, entering orders and completing documentation.    Nicholos Johns, PA-C    Tyler Pita, MD  [facility] Direct Dial:  [phone]   Fax: [phone] [email or web address]   Skype   LinkedIn   This document serves as a record of services personally performed by Tyler Pita, MD and Freeman Caldron, PA-C. It was created on their behalf by Wilburn Mylar, a trained medical scribe. The creation of this record is based on the scribe's personal observations and the provider's statements to them. This document has been checked and approved by the attending provider.

## 2021-06-17 NOTE — Progress Notes (Signed)
Reason for the request:    Prostate cancer  HPI: I was asked by Dr. Alinda Money   to evaluate [name] for the evaluation of prostate cancer.  He is a 76 year old man with coronary artery disease was found to have elevated PSA in the setting of BPH.  His PSA was up to 7.66 in 2021 and MRI of the prostate at that time did not show any abnormalities but did have an enlarged prostate at that time.  He remained on observation and PSA went down slightly to 5.92.  His PSA was up to 7.95 in 2022 and subsequently underwent a prostate biopsy in December 2022.  At that time he was found to have a Gleason score 4+ 5 = 9 in 2 cores and a Gleason score 8 in 2 other cores.  PSMA PET scan obtained on January 2023.  Showed focal intense uptake in the left gland with 2 small left common iliac and a single left periaortic lymph node and a tracer activity consistent with prostate cancer.  Clinically, he still has some urinary symptoms including urinary frequency and nocturia.  No hematuria or dysuria.  He remains active and attends activities of daily living.  His performance status remains excellent.   He does not report any headaches, blurry vision, syncope or seizures. Does not report any fevers, chills or sweats.  Does not report any cough, wheezing or hemoptysis.  Does not report any chest pain, palpitation, orthopnea or leg edema.  Does not report any nausea, vomiting or abdominal pain.  Does not report any constipation or diarrhea.  Does not report any skeletal complaints.    Does not report frequency, urgency or hematuria.  Does not report any skin rashes or lesions. Does not report any heat or cold intolerance.  Does not report any lymphadenopathy or petechiae.  Does not report any anxiety or depression.  Remaining review of systems is negative.     Past Medical History:  Diagnosis Date   Arthritis    CAD S/P percutaneous coronary angioplasty 06/18/2004; 02/16/2009, 02/2012   a) Angina: CoStar Study Stent 2.5 mm x  16 mm - prox RI; b) DES PCI RCA for Inf STEMI; c) TM Myoview: 7:20, 8 METS, Mild to mod scar with HK in basal-mid inferior and basal inferolateral wall.;  No ischemia; EF 51%   Dyslipidemia    Essential hypertension    Hyperlipidemia with target LDL less than 70    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([place]) 06/17/2021   Multiple allergies    S/p ST elevation myocardial infarction (STEMI) of inferior wall 02/16/2009   Occluded RCA --> PCI after thrombectomy 3.5 mm x 20 mm Vision BMs as (4.0 mm)  :   Past Surgical History:  Procedure Laterality Date   CAROTID STENT  01/2009   CORONARY ANGIOPLASTY WITH STENT PLACEMENT  January 2006   PCI of the proximal Ramus Intermedius -- CoStar study stent 2.5 mm x 16 mm   CORONARY ANGIOPLASTY WITH STENT PLACEMENT  September 2010   Inferior STEMI: Proximal RCA 3.5 mm 23 mm vision BMS (4.0 mm)   ERCP W/ METAL STENT PLACEMENT  02/16/2009   GXT  07/2014   Target HR in Stage 3 - no CP. Limited by SOB. Up-sloping ST elevation. Non-ischemic   NM MYOVIEW LTD  03/15/2012   7:20, 8 METS, Mild to mod scar with HK in basal-mid inferior and basal inferolateral wall.;  No ischemia; EF 51%  :   Current Outpatient Medications:  amLODipine (NORVASC) 10 MG tablet, TAKE 1 TABLET BY MOUTH EVERY DAY, Disp: 90 tablet, Rfl: 3   aspirin EC 81 MG tablet, Take 1 tablet (81 mg total) by mouth daily., Disp: 90 tablet, Rfl: 3   finasteride (PROSCAR) 5 MG tablet, Take 1 tablet by mouth daily., Disp: , Rfl:    hydrochlorothiazide (HYDRODIURIL) 25 MG tablet, TAKE 1 TABLET(25 MG) BY MOUTH DAILY, Disp: 90 tablet, Rfl: 0   hydrocortisone 2.5 % lotion, Apply topically 2 (two) times daily., Disp: 59 mL, Rfl: 0   metoprolol succinate (TOPROL-XL) 25 MG 24 hr tablet, TAKE 1/2 TABLET BY MOUTH EVERY DAY, Disp: 90 tablet, Rfl: 3   Multiple Vitamin (MULTIVITAMIN) tablet, Take 1 tablet by mouth daily., Disp: , Rfl:    rosuvastatin (CRESTOR) 20 MG tablet, TAKE 1 TABLET(20 MG) BY MOUTH DAILY, Disp: 90  tablet, Rfl: 1   valsartan (DIOVAN) 320 MG tablet, TAKE 1 TABLET BY MOUTH EVERY DAY, Disp: 90 tablet, Rfl: 3   amoxicillin (AMOXIL) 500 MG capsule, TK 4 CS PO 1 HR PRIOR TO DENTAL APPOINTMENT (Patient not taking: Reported on 06/17/2021), Disp: , Rfl: :   Allergies  Allergen Reactions   Codeine Nausea And Vomiting  :   Family History  Problem Relation Age of Onset   Cancer Mother    Cancer Father   :   Social History   Socioeconomic History   Marital status: Married    Spouse name: Not on file   Number of children: Not on file   Years of education: Not on file   Highest education level: Not on file  Occupational History   Not on file  Tobacco Use   Smoking status: Never   Smokeless tobacco: Never  Substance and Sexual Activity   Alcohol use: No    Alcohol/week: 0.0 standard drinks   Drug use: No   Sexual activity: Not Currently  Other Topics Concern   Not on file  Social History Narrative   He is married, father of 21, grandfather of 21. Never smoked. Does not drink.    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    He does lots of lifting, pulling with ropes, using upper extremities.       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   Social Determinants of Health   Financial Resource Strain: Not on file  Food Insecurity: Not on file  Transportation Needs: Not on file  Physical Activity: Not on file  Stress: Not on file  Social Connections: Not on file  Intimate Partner Violence: Not on file  :  Pertinent items are noted in HPI.  Exam:  General appearance: alert and cooperative appeared without distress. Head: atraumatic without any abnormalities. Eyes: conjunctivae/corneas clear. PERRL.  Sclera anicteric. Throat: lips, mucosa, and tongue normal; without oral  thrush or ulcers. Resp: clear to auscultation bilaterally without rhonchi, wheezes or dullness to percussion. Cardio: regular rate and rhythm, S1, S2 normal, no murmur, click, rub or gallop GI: soft, non-tender; bowel sounds normal; no masses,  no organomegaly Skin: Skin color, texture, turgor normal. No rashes or lesions Lymph nodes: Cervical, supraclavicular, and axillary nodes normal. Neurologic: Grossly normal without any motor, sensory or deep tendon reflexes. Musculoskeletal: No joint deformity or effusion.   NM  PET (PSMA) SKULL TO MID THIGH  Result Date: 06/13/2021 CLINICAL DATA:  Prostate carcinoma with biochemical recurrence. PSA equal 12.3 EXAM: NUCLEAR MEDICINE PET SKULL BASE TO THIGH TECHNIQUE: 9.6 mCi F18 Piflufolastat (Pylarify) was injected intravenously. Full-ring PET imaging was performed from the skull base to thigh after the radiotracer. CT data was obtained and used for attenuation correction and anatomic localization. COMPARISON:  None. FINDINGS: NECK No radiotracer activity in neck lymph nodes. Incidental CT finding: None CHEST No radiotracer accumulation within mediastinal or hilar lymph nodes. No suspicious pulmonary nodules on the CT scan. Incidental CT finding: None ABDOMEN/PELVIS Prostate: Within the posterior aspect of the LEFT gland peripheral zone, there is a focus of intense radiotracer activity SUV max 19.4. This lesion measures approximately 2 cm x 1 cm. Lymph nodes: Small radiotracer avid LEFT common iliac lymph node measures only 5 mm (image 181/4) but has significant radiotracer activity for size with SUV max equal 5.6 (image 181). Second adjacent similar node on image 176 closer to the bifurcation with SUV max equal 4.0. Small LEFT periaortic lymph node measuring only 4 mm (image 164/4) has faint radiotracer activity SUV max equal 2.9 on image 164. Liver: No evidence of liver metastasis Incidental CT finding: Benign cyst in the mid RIGHT kidney LEFT colon  diverticulosis SKELETON No focal  activity to suggest skeletal metastasis. IMPRESSION: 1. Focal intense radiotracer activity within the LEFT gland peripheral zone consistent primary prostate adenocarcinoma. 2. Two small (5 mm) LEFT common iliac and a single LEFT periaortic lymph node have radiotracer activity and are most consistent with prostate cancernodal metastasis 3. No evidence of visceral metastasis skeletal metastasis. Electronically Signed   By: Suzy Bouchard M.D.   On: 06/13/2021 12:14    Assessment and Plan:   76 year old with prostate cancer diagnosed in December 2022.  He was found to have high risk disease with a Gleason score 4+ 5 = 9 in at least 2 cores this is in the setting of a PSA of 7.95 on finasteride.  Imaging studies including PSMA PET showed uptake in the left gland of the prostate in 2 small left common iliac and single left periaortic lymph node with radiotracer uptake could be consistent with metastatic disease.  His case was discussed today in the prostate cancer multidisciplinary clinic including reviewing his imaging studies with radiology and reviewing pathology results with the reviewing pathologist.  Treatment treatment options including primary surgical therapy versus radiation were debated today with the patient.  Given the high likelihood of advanced disease in these pelvic lymph nodes, I have recommended proceeding with radiation therapy and androgen deprivation as a definitive modality.  Complication associated with androgen deprivation were reviewed including hot flashes, weight gain among others.  The role for therapy escalation including androgen receptor pathway inhibitors in the setting were discussed.  This will be an extrapolation from trials that treated M0 disease with high risk metastasis.  Complication associated with specifically abiraterone including hypertension, edema, hypokalemia among others were reviewed.  He will consider these options and decide  in the near future.  If he decides to proceed with definitive therapy with radiation and androgen deprivation that I would consider adding abiraterone in the next 2 to 3 months.  Duration of therapy is close to 2 years at that time.  All his questions were answered to his satisfaction.  45  minutes were dedicated to this visit. The time was spent on reviewing laboratory data, imaging studies, discussing treatment options,  and answering questions regarding  future plan.     A copy of this consult has been forwarded to the requesting physician.

## 2021-06-17 NOTE — Consult Note (Signed)
[facility]     06/17/2021   --------------------------------------------------------------------------------   Chase Combs  MRN: [id number]  DOB: [birth year], 76 year old Male  SSN: [id number]   PRIMARY CARE:  Jessica Copland  REFERRING:  Luvenia Redden  PROVIDER:  Raynelle Bring, M.D.  LOCATION:  [facility] [phone]     --------------------------------------------------------------------------------   CC/HPI: CC: Prostate Cancer   PCP: Dr. Lamar Blinks  Location of consult: [facility] - Prostate Cancer Multidisciplinary Clinic   Chase Combs is a76 year old gentleman with a history of CAD s/p cardiac stent placement, hypertension, and hyperlipidemia. He has a long standing history of an elevated PSA and underwent a prostate biopsy in November 2008 for an elevated PSA of 6.47 that was benign. His prostate size was 57.1 cc at that time and he did have bothersome LUTS due to BPH. He was started on tamsulosin but had progression of his symptoms and he was started on finasteride in December 2018. His PSA at that time was 4.78. He was eventually able to discontinue his alpha blocker and continued his 5 ARI. His PSA initially decreased to 3.99 in July 2019 but then increased to 4.32 in July 2020. It was checked again 6 months later and further increased to 7.66 prompting a prostate MRI in January 2021. This indicated no concerning lesions but did indicate significant prostate volume increase to 110 cc as a possible explanation for his PSA even when corrected for 5 ARI use. I offered him a biopsy nonetheless but after discussion, we elected to follow his PSA which actually decreased to 5.32 in August 2021 before again increasing again up to 12.3 on 03/15/21. 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   Family history: None.   Imaging  studies: PSMA PET scan (06/12/21): He was noted to have focal but intense activity within the left prostate. In addition, there were 2 small left common iliac nodes and a single left periaortic lymph node that had radiotracer activity and consistent with probable metastatic disease.   PMH: He has a history of CAD s/p cardiac stent placement, hypertension, and hyperlipdemia.  PSH: Hernia repair.   TNM stage: cT1c N0 M0  PSA: 24.6 (corrected for 5 ARI use)  Gleason score: 4+5=9 (GG 5)  Biopsy (05/09/21): /12 cores positive  Left: L lateral apex (5%, 4+4=8), L lateral mid (50%, 4+5=9), L mid (10%, 4+5=9), L lateral base (70%, 4+4=8), L base (70%, 4+4=8)  Right: Benign  Prostate volume: 94.6 cc   Nomogram  OC disease: 5%  EPE: 93%  SVI: 52%  LNI: 48%  PFS (5 year, 10 year): 20%, 12%   Urinary function: IPSS is 14.  Erectile function: SHIM score is 13.     ALLERGIES: Codeine Derivatives    MEDICATIONS: Aspirin  Finasteride 5 mg tablet 1 tablet PO Daily  Finasteride 5 mg tablet 1 tablet PO Daily  Finasteride 5 mg tablet 1 tablet PO Daily  Hydrochlorothiazide 25 mg tablet  Levofloxacin 750 mg tablet Please take one tablet the morning of your biopsy.  Metoprolol Tartrate  Amlodipine Besylate 10 mg tablet Oral  Crestor 5 mg tablet 0 Oral  Multiple Vitamins tablet Oral  Rosuvastatin  Thiazide  Valsartan     GU PSH: Prostate Needle Biopsy - 05/09/2021       PSH Notes: Previous Stent Placement, Knee Surgery, Hernia Repair, Back  Surgery   NON-GU PSH: Back Surgery (Unspecified) Cardiac Stent Placement Hernia Repair - 2008 Surgical Pathology, Gross And Microscopic Examination For Prostate Needle - 05/09/2021     GU PMH: Elevated PSA - 05/09/2021, - 03/28/2021, - 07/26/2020, Elevated prostate specific antigen (PSA), - 2014 BPH w/LUTS - 03/28/2021, - 07/26/2020, Benign prostatic hyperplasia with urinary obstruction, - 2014 Nocturia - 03/28/2021, - 07/26/2020, - 2018 Acute Cystitis/UTI,  Acute Cystitis - 2014 Urinary Retention, Unspec, Acute Urinary Retention - 2014      PMH Notes:   1) Elevated PSA: He was noted to have a PSA of 6.47 in 2008 and underwent a prostate biopsy by Dr. Gaynelle Arabian that was benign.   Nov 2008: 12 core biopsy - benign, Vol 57.1 cc  Jan 2021: MRI (PSA 15.2 corrected) - no concerning lesions, Vol 110 cc   2) BPH/LUTS: He developed urinary retention in August 2008 and was started on alpha blocker therapy.   Current treatment: Finasteride 5 mg (started Dec 2018)  Prior treatment: Tamsulosin (able to stop without any change in his symptoms)   NON-GU PMH: Fracture of unspecified parts of lumbosacral spine and pelvis, initial encounter for closed fracture, Pelvic fracture - 2014 Hypertension, Hypertension - 2014 Arthritis Coronary Artery Disease Hypercholesterolemia Myocardial Infarction Sleep Apnea    FAMILY HISTORY: 2 daughters - Runs in Family 1 son - Runs in Family Chronic Obstructive Pulmonary Disease - Father Death In The Family Father - Father Death In The Family Mother - Father Family Health Status Number - Father Lung Cancer - Mother   SOCIAL HISTORY: Marital Status: Married Preferred Language: English; Ethnicity: Not Hispanic Or Latino; Race: White Current Smoking Status: Patient has never smoked.   Tobacco Use Assessment Completed: Used Tobacco in last 30 days? Does not drink anymore.  Drinks 1 caffeinated drink per day.     Notes: Never A Smoker, Caffeine Use, Tobacco Use, Alcohol Use, Marital History - Currently Married, Occupation:   REVIEW OF SYSTEMS:    GU Review Male:   Patient denies frequent urination, hard to postpone urination, burning/ pain with urination, get up at night to urinate, leakage of urine, stream starts and stops, trouble starting your streams, and have to strain to urinate .  Gastrointestinal (Upper):   Patient denies nausea and vomiting.  Gastrointestinal (Lower):   Patient denies diarrhea and  constipation.  Constitutional:   Patient denies fever, night sweats, weight loss, and fatigue.  Skin:   Patient denies skin rash/ lesion and itching.  Eyes:   Patient denies blurred vision and double vision.  Ears/ Nose/ Throat:   Patient denies sore throat and sinus problems.  Hematologic/Lymphatic:   Patient denies swollen glands and easy bruising.  Cardiovascular:   Patient denies leg swelling and chest pains.  Respiratory:   Patient denies cough and shortness of breath.  Endocrine:   Patient denies excessive thirst.  Musculoskeletal:   Patient denies back pain and joint pain.  Neurological:   Patient denies headaches and dizziness.  Psychologic:   Patient denies depression and anxiety.   VITAL SIGNS: None   MULTI-SYSTEM PHYSICAL EXAMINATION:    Constitutional: Well-nourished. No physical deformities. Normally developed. Good grooming.     Complexity of Data:  Lab Test Review:   PSA  Records Review:   Previous Patient Records  X-Ray Review: PET Scan: Reviewed Films.     03/21/21 07/19/20 12/29/19 09/21/19 06/17/19 12/11/18 12/03/17 04/30/17  PSA  Total PSA 12.30 ng/mL 7.95 ng/mL 5.39 ng/mL 5.92 ng/mL 7.66 ng/mL 4.32  ng/mL 3.99 ng/mL 4.79 ng/mL  Free PSA  1.24 ng/mL 0.60 ng/mL 0.61 ng/mL 0.77 ng/mL 0.58 ng/mL  1.61 ng/mL  % Free PSA  16 % PSA 11 % PSA 10 % PSA 10 % PSA 13 % PSA  34 % PSA   Notes:                     CLINICAL DATA: Prostate carcinoma with biochemical recurrence. PSA  equal 12.3   EXAM:  NUCLEAR MEDICINE PET SKULL BASE TO THIGH   TECHNIQUE:  9.6 mCi F18 Piflufolastat (Pylarify) was injected intravenously.  Full-ring PET imaging was performed from the skull base to thigh  after the radiotracer. CT data was obtained and used for attenuation  correction and anatomic localization.   COMPARISON: None.   FINDINGS:  NECK   No radiotracer activity in neck lymph nodes.   Incidental CT finding: None   CHEST   No radiotracer accumulation within mediastinal  or hilar lymph nodes.  No suspicious pulmonary nodules on the CT scan.   Incidental CT finding: None   ABDOMEN/PELVIS   Prostate: Within the posterior aspect of the LEFT gland peripheral  zone, there is a focus of intense radiotracer activity SUV max 19.4.  This lesion measures approximately 2 cm x 1 cm.   Lymph nodes: Small radiotracer avid LEFT common iliac lymph node  measures only 5 mm (image 181/4) but has significant radiotracer  activity for size with SUV max equal 5.6 (image 181). Second  adjacent similar node on image 176 closer to the bifurcation with  SUV max equal 4.0.   Small LEFT periaortic lymph node measuring only 4 mm (image 164/4)  has faint radiotracer activity SUV max equal 2.9 on image 164.   Liver: No evidence of liver metastasis   Incidental CT finding: Benign cyst in the mid RIGHT kidney LEFT  colon diverticulosis   SKELETON   No focal activity to suggest skeletal metastasis.   IMPRESSION:  1. Focal intense radiotracer activity within the LEFT gland  peripheral zone consistent primary prostate adenocarcinoma.  2. Two small (5 mm) LEFT common iliac and a single LEFT periaortic  lymph node have radiotracer activity and are most consistent with  prostate cancernodal metastasis  3. No evidence of visceral metastasis skeletal metastasis.    Electronically Signed  By: Suzy Bouchard M.D.  On: 06/13/2021 12:14   PROCEDURES: None   ASSESSMENT:      ICD-10 Details  1 GU:   Prostate Cancer - C61    PLAN:           Document Letter(s):  Created for Patient: Clinical Summary         Notes:   1. Prostate cancer: I had a detailed discussion with Chase Combs and his wife today regarding his prostate cancer situation. He has already seen Dr. Alen Blew earlier and is scheduled to see Dr. Tammi Klippel later this afternoon. We discussed his PSMA PET scan indicating possible pelvic and even early retroperitoneal lymph node metastases in the setting of high risk  prostate cancer. The patient was counseled about the natural history of prostate cancer and the standard treatment options that are available for prostate cancer. It was explained to him how his age and life expectancy, clinical stage, Gleason score/prognostic grade group, and PSA (and PSA density) affect his prognosis, the decision to proceed with additional staging studies, as well as how that information influences recommended treatment strategies. We discussed the roles for active surveillance, radiation  therapy, surgical therapy, androgen deprivation, as well as ablative therapy and other investigational options for the treatment of prostate cancer as appropriate to his individual cancer situation. We discussed the risks and benefits of these options with regard to their impact on cancer control and also in terms of potential adverse events, complications, and impact on quality of life particularly related to urinary and sexual function. The patient was encouraged to ask questions throughout the discussion today and all questions were answered to his stated satisfaction. In addition, the patient was provided with and/or directed to appropriate resources and literature for further education about prostate cancer and treatment options.   After reviewing treatment options, it was recommended that he strongly consider external beam radiation therapy along with long-term androgen deprivation. Furthermore, Dr. Alen Blew has discussed the option of therapy escalation with additional androgen directed treatment. I will further discuss this option with Dr. Alen Blew. In the meantime, he will begin androgen deprivation therapy and subsequently be scheduled for external beam radiation therapy in the next couple of months. If he tolerates androgen deprivation therapy well, that may be a time where therapy escalation can be further considered. He also has been counseled and does wish to proceed with genetic testing which will  be arranged.    2. BPH/LUTS: He is currently taking finasteride 5 mg and likely will be able to discontinue this while he is on androgen deprivation therapy assuming that his voiding symptoms remain stable. We will discuss this further at his next appointment.   CC: Dr. Janett Billow Copland  Dr. Zola Button  Dr. Tyler Pita

## 2021-06-17 NOTE — Progress Notes (Signed)
REFERRING PROVIDER: Zola Button, MD [place], [place] [address]  PRIMARY PROVIDER:  Scifres, Earlie Server, PA-C  PRIMARY REASON FOR VISIT:  Encounter Diagnosis  Name Primary?   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([place]) Yes    HISTORY OF PRESENT ILLNESS:   Mr. Vanatta, a 76 y.o. male, was seen for a [place] cancer genetics consultation at the request of Dr. Alen Blew due to a personal history of cancer.  Mr. Kilner presents to clinic today to discuss the possibility of a hereditary predisposition to cancer, to discuss genetic testing, and to further clarify his future cancer risks, as well as potential cancer risks for family members.   In December 2022, at the age of 34, Mr. Hedman was diagnosed with high risk prostate cancer.   CANCER HISTORY:  Oncology History  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([place])  05/09/2021 Cancer Staging   Staging form: Prostate, AJCC 8th Edition - Clinical stage from 05/09/2021: Stage IVA (cT1c, cN1, cM0, PSA: 24.6, Grade Group: 5) - Signed by Freeman Caldron, PA-C on 06/17/2021 Histopathologic type: Adenocarcinoma, NOS Prostate specific antigen (PSA) range: 20 or greater Gleason primary pattern: 4 Gleason secondary pattern: 5 Gleason score: 9 Histologic grading system: 5 grade system Number of biopsy cores examined: 12 Number of biopsy cores positive: 5 Location of positive needle core biopsies: One side    06/17/2021 Initial Diagnosis   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[facility])      Past Medical History:  Diagnosis Date   Arthritis    CAD S/P percutaneous coronary angioplasty 06/18/2004; 02/16/2009, 02/2012   a) Angina: CoStar Study Stent 2.5 mm x 16 mm - prox RI; b) DES PCI RCA for Inf STEMI; c) TM Myoview: 7:20, 8 METS, Mild to mod scar with HK in basal-mid inferior and basal inferolateral wall.;  No ischemia; EF 51%   Dyslipidemia    Essential hypertension    Hyperlipidemia with target LDL less than 70    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([place]) 06/17/2021   Multiple allergies    S/p ST elevation myocardial infarction (STEMI) of inferior wall 02/16/2009   Occluded RCA --> PCI after thrombectomy 3.5 mm x 20 mm Vision BMs as (4.0 mm)    Past Surgical History:  Procedure Laterality Date   CAROTID STENT  01/2009   CORONARY ANGIOPLASTY WITH STENT PLACEMENT  January 2006   PCI of the proximal Ramus Intermedius -- CoStar study stent 2.5 mm x 16 mm   CORONARY ANGIOPLASTY WITH STENT PLACEMENT  September 2010   Inferior STEMI: Proximal RCA 3.5 mm 23 mm vision BMS (4.0 mm)   ERCP W/ METAL STENT PLACEMENT  02/16/2009   GXT  07/2014   Target HR in Stage 3 - no CP. Limited by SOB. Up-sloping ST elevation. Non-ischemic   NM MYOVIEW LTD  03/15/2012   7:20, 8 METS, Mild to mod scar with HK in basal-mid inferior and basal inferolateral wall.;  No ischemia; EF 51%    Social History   Socioeconomic History   Marital status: Married    Spouse name: Not on file   Number of children: Not on file   Years of education: Not on file   Highest education level: Not on file  Occupational History   Not on file  Tobacco Use   Smoking status: Never   Smokeless tobacco: Never  Substance and Sexual Activity   Alcohol use: No    Alcohol/week: 0.0 standard drinks   Drug use: No   Sexual activity: Not Currently  Other Topics Concern   Not on file  Social History Narrative   He is married, father of 1, grandfather of 68. Never smoked. Does not drink.    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    He does lots of lifting, pulling with ropes, using upper extremities.       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   Social Determinants of Health   Financial Resource Strain: Not on file  Food Insecurity: Not on file  Transportation Needs: Not on  file  Physical Activity: Not on file  Stress: Not on file  Social Connections: Not on file     FAMILY HISTORY:  We obtained a detailed, 4-generation family history.  Significant diagnoses are listed below: Family History  Problem Relation Age of Onset   Lung cancer Mother     Mr. Cranshaw reports his mother was diagnosed with lung cancer in her 79s and died at age 61. He is unaware of previous family history of genetic testing for hereditary cancer risks.      GENETIC COUNSELING ASSESSMENT: Mr. Bohnenkamp is a 76 y.o. male with a personal history of cancer which is somewhat suggestive of a hereditary predisposition to cancer given his diagnosis of high risk prostate cancer. We, therefore, discussed and recommended the following at today's visit.   DISCUSSION: We discussed that 5 - 10% of cancer is hereditary, with most cases of prostate cancer associated with BRCA1/2 and HOXB13. There are other genes that can be associated with hereditary prostate cancer syndromes.  We discussed that testing is beneficial for several reasons including knowing how to follow individuals after completing their treatment, identifying whether potential treatment options would be beneficial, and understanding if other family members could be at risk for cancer and allowing them to undergo genetic testing.   We reviewed the characteristics, features and inheritance patterns of hereditary cancer syndromes. We also discussed genetic testing, including the appropriate family members to test, the process of testing, insurance coverage and turn-around-time for results. We discussed the implications of a negative, positive, carrier and/or variant of uncertain significant result. We recommended Mr. Schulke pursue genetic testing for a panel that includes genes associated with prostate cancer.   Mr. Berry elected to have [facility] CancerNext Panel+ the EGFR gene (37 genes). The CancerNext gene panel offered by [facility]  includes sequencing, rearrangement analysis, and RNA analysis for the following 36 genes:   APC, ATM, AXIN2, BARD1, BMPR1A, BRCA1, BRCA2, BRIP1, CDH1, CDK4, CDKN2A, CHEK2, DICER1, HOXB13, EPCAM, GREM1, MLH1, MSH2, MSH3, MSH6, MUTYH, NBN, NF1, NTHL1, PALB2, PMS2, POLD1, POLE, PTEN, RAD51C, RAD51D, RECQL, SMAD4, SMARCA4, STK11, and TP53.   Based on Mr. Filice's personal history of cancer, he meets medical criteria for genetic testing. Despite that he meets criteria, he may still have an out of pocket cost. We discussed that if his out of pocket cost for testing is over $100, the laboratory will call and confirm whether he wants to proceed with testing.  If the out of pocket cost of testing is less than $100 he will be billed by the genetic testing laboratory.   PLAN: After considering the risks, benefits, and limitations, Mr. Achille provided informed consent to pursue genetic testing and the blood sample was sent to  [facility] for analysis of the CustomNext Panel which includes CancerNext+EGFR. Results should be available within approximately 2-3 weeks' time, at which point they will be disclosed by telephone to Mr. Nedeau, as will any additional recommendations warranted by these results. Mr. Neas will receive a summary of his genetic counseling visit and a copy of his results once available. This information will also be available in Epic.   Mr. Mayhall questions were answered to his satisfaction today. Our contact information was provided should additional questions or concerns arise. Thank you for the referral and allowing [place] to share in the care of your patient.   Lucille Passy, MS, [facility] Genetic Counselor [email or web address] (P) [phone]  The patient was seen for a total of 20 minutes in face-to-face genetic counseling.  The patient brought his wife, Baker Janus.  Dr. Alen Blew was available to discuss this case as needed.    _______________________________________________________________________ For Office Staff:  Number of people involved in session: 2 Was an Intern/ student involved with case: no

## 2021-06-23 ENCOUNTER — Encounter: Payer: Self-pay | Admitting: General Practice

## 2021-06-23 NOTE — Progress Notes (Signed)
RN returned call to patient.  Patient inquiring about status of ADT injection.    RN updated patient that we are awaiting authorization/appointment.  Pt verbalized he was informed that this would happen this week.  RN reinforced, as was done in [facility], that authorization/appointment can take a week or two at times.

## 2021-06-23 NOTE — Progress Notes (Signed)
[facility] Psychosocial Distress Screening Spiritual Care  Left voicemail for Mr Freas [facility] to introduce [place] team/resources, reviewing distress screen per protocol and encouraging return call.  The patient scored a 5 on the Psychosocial Distress Thermometer which indicates moderate distress.   ONCBCN DISTRESS SCREENING 06/23/2021  Screening Type Initial Screening  Distress experienced in past week (1-10) 5  Referral to support programs Yes    Follow up needed: Yes.  Encouraged return call, but will try again later in the week if needed.   [place], [place], [facility] Pager [phone] Voicemail [phone]

## 2021-06-24 ENCOUNTER — Encounter: Payer: Self-pay | Admitting: General Practice

## 2021-06-24 NOTE — Progress Notes (Signed)
[facility] Spiritual Care Note  Returned voicemail, encouraging return call.   [place], [place], [facility] Pager [phone] Voicemail [phone]

## 2021-06-27 NOTE — Progress Notes (Signed)
RN called over to [facility] to follow up with status of ADT authorization/injection appointment.  Per Jenny Reichmann, still pending authorization.    RN left voicemail with patient providing update. Will continue to follow up to ensure ADT gets scheduled.

## 2021-07-01 ENCOUNTER — Telehealth: Payer: Self-pay | Admitting: *Deleted

## 2021-07-01 NOTE — Telephone Encounter (Signed)
CALLED PATIENT TO INFORM OF ADT APPT. FOR 07-04-21 @ 8 AM @ [facility], LVM FOR A RETURN CALL

## 2021-07-03 ENCOUNTER — Encounter: Payer: Self-pay | Admitting: Genetic Counselor

## 2021-07-03 ENCOUNTER — Telehealth: Payer: Self-pay | Admitting: *Deleted

## 2021-07-03 ENCOUNTER — Telehealth: Payer: Self-pay | Admitting: Genetic Counselor

## 2021-07-03 DIAGNOSIS — Z01810 Encounter for preprocedural cardiovascular examination: Secondary | ICD-10-CM | POA: Insufficient documentation

## 2021-07-03 DIAGNOSIS — Z1379 Encounter for other screening for genetic and chromosomal anomalies: Secondary | ICD-10-CM | POA: Insufficient documentation

## 2021-07-03 NOTE — Telephone Encounter (Signed)
Called patient to inform that appt. for ADT @ [facility] has changed to - arrival time- 9:15 am for 07-04-21, lvm for a return call

## 2021-07-03 NOTE — Telephone Encounter (Signed)
I contacted Mr. Plemmons to discuss his genetic testing results. No pathogenic variants were identified in the 37 genes analyzed. Detailed clinic note to follow.  The test report has been scanned into EPIC and is located under the Molecular Pathology section of the Results Review tab.  A portion of the result report is included below for reference.   Lucille Passy, MS, [facility] Genetic Counselor [email or web address] (P) [phone]

## 2021-07-09 ENCOUNTER — Ambulatory Visit: Payer: Self-pay | Admitting: Genetic Counselor

## 2021-07-09 DIAGNOSIS — Z1379 Encounter for other screening for genetic and chromosomal anomalies: Secondary | ICD-10-CM

## 2021-07-09 NOTE — Progress Notes (Signed)
HPI:   Mr. Wilhelmsen was previously seen in the [place] clinic due to a personal history of cancer and concerns regarding a hereditary predisposition to cancer. Please refer to our prior cancer genetics clinic note for more information regarding our discussion, assessment and recommendations, at the time. Mr. Smola recent genetic test results were disclosed to him, as were recommendations warranted by these results. These results and recommendations are discussed in more detail below.  CANCER HISTORY:  Oncology History  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([place])  05/09/2021 Cancer Staging   Staging form: Prostate, AJCC 8th Edition - Clinical stage from 05/09/2021: Stage IVA (cT1c, cN1, cM0, PSA: 24.6, Grade Group: 5) - Signed by Freeman Caldron, PA-C on 06/17/2021 Histopathologic type: Adenocarcinoma, NOS Prostate specific antigen (PSA) range: 20 or greater Gleason primary pattern: 4 Gleason secondary pattern: 5 Gleason score: 9 Histologic grading system: 5 grade system Number of biopsy cores examined: 12 Number of biopsy cores positive: 5 Location of positive needle core biopsies: One side    06/17/2021 Initial Diagnosis   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([place])    Genetic Testing   [facility] CustomNext Panel was Negative. Report date is 07/02/2021.  The CustomNext gene panel offered by [employer] includes sequencing, rearrangement analysis, and RNA analysis for the following 37 genes:   APC, ATM, AXIN2, BARD1, BMPR1A, BRCA1, BRCA2, BRIP1, CDH1, CDK4, CDKN2A, CHEK2, DICER1, HOXB13, EPCAM, EGFR, GREM1, MLH1, MSH2, MSH3, MSH6, MUTYH, NBN, NF1, NTHL1, PALB2, PMS2, POLD1, POLE, PTEN, RAD51C, RAD51D, RECQL, SMAD4, SMARCA4, STK11, and TP53.       FAMILY HISTORY:  We obtained a detailed, 4-generation family history.  Significant diagnoses are listed below:      Family History  Problem Relation Age of Onset   Lung cancer Mother        Mr. Altschuler reports his mother was diagnosed  with lung cancer in her 86s and died at age 62. He is unaware of previous family history of genetic testing for hereditary cancer risks.          GENETIC TEST RESULTS:  The [employer] CustomNext Panel found no pathogenic mutations.  The CustomNext gene panel offered by [employer] includes sequencing, rearrangement analysis, and RNA analysis for the following 37 genes:   APC, ATM, AXIN2, BARD1, BMPR1A, BRCA1, BRCA2, BRIP1, CDH1, CDK4, CDKN2A, CHEK2, DICER1, HOXB13, EGFR, EPCAM, GREM1, MLH1, MSH2, MSH3, MSH6, MUTYH, NBN, NF1, NTHL1, PALB2, PMS2, POLD1, POLE, PTEN, RAD51C, RAD51D, RECQL, SMAD4, SMARCA4, STK11, and TP53.     The test report has been scanned into EPIC and is located under the Molecular Pathology section of the Results Review tab.  A portion of the result report is included below for reference. Genetic testing reported out on 07/02/2021.       Even though a pathogenic variant was not identified, possible explanations for his personal history of cancer may include: There may be no hereditary risk for cancer in the family. Mr. Friedlander cancer may be due to other genetic or environmental factors. There may be a gene mutation in one of these genes that current testing methods cannot detect, but that chance is small. There could be another gene that has not yet been discovered, or that we have not yet tested, that is responsible for the cancer diagnoses in the family.   Therefore, it is important to remain in touch with cancer genetics in the future so that we can continue to offer Mr. Gittleman the most up to date genetic testing.  ADDITIONAL GENETIC TESTING:  We discussed with Mr. Maggio that his genetic testing was fairly extensive.  If there are genes identified to increase cancer risk that can be analyzed in the future, we would be happy to discuss and coordinate this testing at that time.    CANCER SCREENING RECOMMENDATIONS:  Mr. Gilkes test result is considered negative  (normal).  This means that we have not identified a hereditary cause for his personal history of cancer at this time. Most cancers happen by chance and this negative test suggests that his cancer may fall into this category.    An individual's cancer risk and medical management are not determined by genetic test results alone. Overall cancer risk assessment incorporates additional factors, including personal medical history, family history, and any available genetic information that may result in a personalized plan for cancer prevention and surveillance. Therefore, it is recommended he continue to follow the cancer management and screening guidelines provided by his oncology and primary healthcare provider.  RECOMMENDATIONS FOR FAMILY MEMBERS:   Since he did not inherit a mutation in a cancer predisposition gene included on this panel, his children could not have inherited a mutation from him in one of these genes.  FOLLOW-UP:  Cancer genetics is a rapidly advancing field and it is possible that new genetic tests will be appropriate for him and/or his family members in the future. We encouraged him to remain in contact with cancer genetics on an annual basis so we can update his personal and family histories and let him know of advances in cancer genetics that may benefit this family.   Our contact number was provided. Mr. Lamba questions were answered to his satisfaction, and he knows he is welcome to call us at anytime with additional questions or concerns.   Lucille Passy, MS, [facility] Genetic Counselor [email or web address] (P) [phone]

## 2021-07-16 ENCOUNTER — Other Ambulatory Visit: Payer: Self-pay | Admitting: Urology

## 2021-07-16 ENCOUNTER — Telehealth: Payer: Self-pay | Admitting: Cardiology

## 2021-07-16 NOTE — Telephone Encounter (Signed)
° ° ° °  Pre-operative Risk Assessment    Patient Name: Chase Combs  DOB: 1946-01-13 MRN: [id number]      Request for Surgical Clearance    Procedure:   fiducial marker placement   Date of Surgery:  Clearance 09/19/21                                 Surgeon:  Dr. Louis Meckel Surgeon's Group or Practice Name:  [facility] Phone number:  [phone] Fax number:  [phone]   Type of Clearance Requested:   - Medical    Type of Anesthesia:  MAC   Additional requests/questions:   pt is on aspirin but Dr. Alinda Money said pt doesn't need to hold the meds   Signed, Selinda Orion   07/16/2021, 3:47 PM

## 2021-07-16 NOTE — Telephone Encounter (Signed)
° °  Patient Name: Chase Combs  DOB: 1946/01/26 MRN: [id number]  Primary Cardiologist: None  Chart reviewed as part of pre-operative protocol coverage. 76 year old male with a history of CAD (s/p BMS-RI in 2006, s/p STEMI/ BMS-RCA in 2010, negative Myoview in 2013 and negative ETT in 2016), hypertension, hyperlipidemia, and prostate cancer.  He was last seen by Dr. Ellyn Hack on 06/06/2021.  AVS from that visit indicates follow-up recommended in 1 year, however, formal recommendations from Dr. Ellyn Hack from this visit are not available for review at this time.  Received surgical clearance request for fiducial marker placement per Dr. Louis Meckel with [facility] scheduled for 08/26/2021.  Dr. Ellyn Hack, please advise on surgical clearance for this patient as recommendations from most recent outpatient visit are not available at this time. Per surgical clearance request no need to hold medications for upcoming procedure.  Thank you.  Lenna Sciara, NP 07/16/2021, 4:53 PM

## 2021-07-18 NOTE — Progress Notes (Signed)
Patient chart reviewed meets [facility] guidelines for 09-19-2021 surgery barring any acute status changes.

## 2021-07-20 ENCOUNTER — Encounter: Payer: Self-pay | Admitting: Cardiology

## 2021-07-20 NOTE — Assessment & Plan Note (Signed)
Blood sugars been high on screening lab work.  A1c as of October 2021 was 6.9.  I do not see that he has been treated by.  I suspect that he probably should be on at a minimum metformin which had been listed as of August of last year, but is not listed today.  Defer to PCP for now, but will consider SGLT2 inhibitor for additional cardiovascular benefit.

## 2021-07-20 NOTE — Assessment & Plan Note (Signed)
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  Mild to moderate inferior scar noted on Myoview Allernix EF but no further angina or heart failure symptoms. He does have PVCs on EKG that have been present for a while now.  He is on a beta-blocker.  Has been doing great with no major symptoms.

## 2021-07-20 NOTE — Assessment & Plan Note (Signed)
Blood pressure is well controlled current meds.  No changes. Plan: Continue combination of valsartan HCTZ in the morning along with Toprol 12.5 mg every morning along with amlodipine 10 mg.  Has been fearful of titrating beta-blocker because of issues of fatigue and bradycardia with higher doses.

## 2021-07-20 NOTE — Assessment & Plan Note (Signed)
He is fasting today.  We checked labs and LDL was 66.  Well-controlled on current dose of rosuvastatin 20 mg daily.  No myalgias.  No change.  Okay to recheck in a year.

## 2021-07-20 NOTE — Assessment & Plan Note (Signed)
Fatigue is better with lower dose of beta-blocker, but still present.  Was not anemic during last check.  Thyroid levels normal.

## 2021-07-20 NOTE — Assessment & Plan Note (Signed)
First cardiac episode was in 2006 and then an inferior STEMI in 2010.  Most recent Myoview was in October 2013 which was negative for symptomatic standpoint and no ischemia.  But there was a significant inferior-inferolateral infarct.  This was previously noted as diaphragmatic attenuation, probably was infarct.  No active angina symptoms.  Stable only notes some mild fatigue which is 1 reason for not increasing beta-blocker dose. Able to achieve more than 5-6 METS with only mild exertional dyspnea but no chest pain.  Plan:  For now follow-up from the infarct he is only on aspirin without Thienopyridine.    Would be okay to hold for procedures or surgeries.  He is on low-dose beta-blocker which is high as he can can tolerate due to his fatigue issues.  If he becomes symptomatic with the PVCs and PACs noted on exam, would have a low threshold to increase Toprol dose.  On ARB and amlodipine for afterload reduction antianginal benefit.  Excellent control lipids on statin.Marland Kitchen

## 2021-07-21 NOTE — Telephone Encounter (Signed)
° °  Primary Cardiologist: Glenetta Hew, MD  Chart reviewed as part of pre-operative protocol coverage. Given past medical history and time since last visit, based on ACC/AHA guidelines, Chase Combs would be at acceptable risk for the planned procedure without further cardiovascular testing.   His aspirin may be held for 5 days prior to his procedure.  Please resume as soon as hemostasis is achieved.  I will route this recommendation to the requesting party via Epic fax function and remove from pre-op pool.  Please call with questions.  Jossie Ng. Joniyah Mallinger NP-C    07/21/2021, 8:20 AM [address] Office [phone] Fax [phone]

## 2021-07-21 NOTE — Telephone Encounter (Signed)
Stable procedure.  No need for any further evaluation.  Was doing well at last visit.  Okay to hold aspirin if necessary for 5 days.  No need for any further testing.  Glenetta Hew, MD

## 2021-07-31 ENCOUNTER — Other Ambulatory Visit: Payer: Self-pay | Admitting: Cardiology

## 2021-08-27 ENCOUNTER — Other Ambulatory Visit: Payer: Self-pay | Admitting: Cardiology

## 2021-09-17 ENCOUNTER — Encounter (HOSPITAL_BASED_OUTPATIENT_CLINIC_OR_DEPARTMENT_OTHER): Payer: Self-pay | Admitting: Urology

## 2021-09-17 ENCOUNTER — Other Ambulatory Visit: Payer: Self-pay

## 2021-09-17 NOTE — Progress Notes (Signed)
Spoke w/ via phone for pre-op interview--- pt ?Lab needs dos----  istat             ?Lab results------ current ekg in epic/  chart ?COVID test -----patient states asymptomatic no test needed ?Arrive at ------- 0730 on 09-19-2021 ?NPO after MN NO Solid Food.  Clear liquids from MN until--- 0630 ?Med rec completed ?Medications to take morning of surgery ----- toprol, norvasc ?Diabetic medication ----- n/a ?Patient instructed no nail polish to be worn day of surgery ?Patient instructed to bring photo id and insurance card day of surgery ?Patient aware to have Driver (ride ) / caregiver  for 24 hours after surgery --wife, gail ?Patient Special Instructions ----- will do one fleet enema morning of surgery ?Pre-Op special Istructions -----pt has telephone cardiac clearance by Coletta Memos NP on 07-21-2021 in epic/ chart ?Patient verbalized understanding of instructions that were given at this phone interview. ?Patient denies shortness of breath, chest pain, fever, cough at this phone interview.  ? ? ?Anesthesia Review: HTN;  CAD w/ PCI BMS to pRI in 01/ 2006 and hx STEMI 09/ 2010 w/ PCI BMS to pRCA;  OSA no cpap;   ?Pt denies any cardiac s&s and sob. ? ?PCP: Maude Leriche PA ?Cardiologist : Dr Ellyn Hack (lov 06-06-2021 epic) ?EKG : 06-06-2021 epic ?Echo : 05-01-2004 epic ?Stress test: nuclear 03-15-2012 epic ?Cardiac Cath :  02-16-2009 epic ?Activity level: denies sob w/ any activity ?Sleep Study/ CPAP : Yes/ no intolerant ?Blood Thinner/ Instructions /Last Dose: NO ?ASA / Instructions/ Last Dose :  ASA '81mg'$ /  pt given cardiac clearance to stop 5 days prior to surgery, however pt stopped sooner, last dose 09-09-2021 ?

## 2021-09-19 ENCOUNTER — Ambulatory Visit (HOSPITAL_BASED_OUTPATIENT_CLINIC_OR_DEPARTMENT_OTHER)
Admission: RE | Admit: 2021-09-19 | Discharge: 2021-09-19 | Disposition: A | Discharge status: Home / Self Care | Payer: Medicare Other | Attending: Urology | Admitting: Urology

## 2021-09-19 ENCOUNTER — Encounter (HOSPITAL_BASED_OUTPATIENT_CLINIC_OR_DEPARTMENT_OTHER)
Admission: RE | Disposition: A | Discharge status: Home / Self Care | Payer: Self-pay | Source: Home / Self Care | Attending: Urology

## 2021-09-19 ENCOUNTER — Ambulatory Visit (HOSPITAL_BASED_OUTPATIENT_CLINIC_OR_DEPARTMENT_OTHER): Payer: Medicare Other | Admitting: Anesthesiology

## 2021-09-19 ENCOUNTER — Encounter (HOSPITAL_BASED_OUTPATIENT_CLINIC_OR_DEPARTMENT_OTHER): Payer: Self-pay | Admitting: Urology

## 2021-09-19 ENCOUNTER — Telehealth: Payer: Self-pay | Admitting: *Deleted

## 2021-09-19 DIAGNOSIS — Z955 Presence of coronary angioplasty implant and graft: Secondary | ICD-10-CM | POA: Insufficient documentation

## 2021-09-19 DIAGNOSIS — I10 Essential (primary) hypertension: Secondary | ICD-10-CM | POA: Diagnosis not present

## 2021-09-19 DIAGNOSIS — C61 Malignant neoplasm of prostate: Secondary | ICD-10-CM | POA: Diagnosis present

## 2021-09-19 DIAGNOSIS — I2510 Atherosclerotic heart disease of native coronary artery without angina pectoris: Secondary | ICD-10-CM | POA: Diagnosis not present

## 2021-09-19 DIAGNOSIS — G4730 Sleep apnea, unspecified: Secondary | ICD-10-CM | POA: Insufficient documentation

## 2021-09-19 DIAGNOSIS — N400 Enlarged prostate without lower urinary tract symptoms: Secondary | ICD-10-CM | POA: Diagnosis not present

## 2021-09-19 DIAGNOSIS — Z79899 Other long term (current) drug therapy: Secondary | ICD-10-CM | POA: Diagnosis not present

## 2021-09-19 DIAGNOSIS — I252 Old myocardial infarction: Secondary | ICD-10-CM | POA: Insufficient documentation

## 2021-09-19 HISTORY — PX: SPACE OAR INSTILLATION: SHX6769

## 2021-09-19 HISTORY — DX: Benign prostatic hyperplasia with lower urinary tract symptoms: N40.1

## 2021-09-19 HISTORY — DX: Unspecified abdominal hernia without obstruction or gangrene: K46.9

## 2021-09-19 HISTORY — DX: Obstructive sleep apnea (adult) (pediatric): G47.33

## 2021-09-19 HISTORY — DX: Presence of coronary angioplasty implant and graft: Z95.5

## 2021-09-19 HISTORY — DX: Personal history of other specified conditions: Z87.898

## 2021-09-19 HISTORY — DX: Prediabetes: R73.03

## 2021-09-19 HISTORY — PX: GOLD SEED IMPLANT: SHX6343

## 2021-09-19 LAB — POCT I-STAT, CHEM 8
BUN: 12 mg/dL (ref 8–23)
Calcium, Ion: 1.25 mmol/L (ref 1.15–1.40)
Chloride: 99 mmol/L (ref 98–111)
Creatinine, Ser: 0.7 mg/dL (ref 0.61–1.24)
Glucose, Bld: 160 mg/dL — ABNORMAL HIGH (ref 70–99)
HCT: 43 % (ref 39.0–52.0)
Hemoglobin: 14.6 g/dL (ref 13.0–17.0)
Potassium: 3.8 mmol/L (ref 3.5–5.1)
Sodium: 140 mmol/L (ref 135–145)
TCO2: 29 mmol/L (ref 22–32)

## 2021-09-19 SURGERY — INSERTION, GOLD SEEDS
Anesthesia: Monitor Anesthesia Care | Site: Prostate

## 2021-09-19 MED ORDER — FENTANYL CITRATE (PF) 100 MCG/2ML IJ SOLN: 25.0000 ug | INTRAMUSCULAR | Status: DC | PRN

## 2021-09-19 MED ORDER — ONDANSETRON HCL 4 MG/2ML IJ SOLN
INTRAMUSCULAR | Status: DC | PRN
  Administered 2021-09-19: 4 mg via INTRAVENOUS

## 2021-09-19 MED ORDER — CEFAZOLIN SODIUM-DEXTROSE 2-4 GM/100ML-% IV SOLN
INTRAVENOUS | Status: AC
Start: 2021-09-19 — End: ?
  Filled 2021-09-19: qty 100

## 2021-09-19 MED ORDER — ACETAMINOPHEN 500 MG PO TABS
1000.0000 mg | ORAL_TABLET | Freq: Once | ORAL | Status: AC
Start: 2021-09-19 — End: 2021-09-19
  Administered 2021-09-19: 1000 mg via ORAL

## 2021-09-19 MED ORDER — ONDANSETRON HCL 4 MG/2ML IJ SOLN: 4.0000 mg | Freq: Once | INTRAMUSCULAR | Status: DC | PRN

## 2021-09-19 MED ORDER — MIDAZOLAM HCL 2 MG/2ML IJ SOLN
INTRAMUSCULAR | Status: AC
  Filled 2021-09-19: qty 2

## 2021-09-19 MED ORDER — EPHEDRINE 5 MG/ML INJ
INTRAVENOUS | Status: AC
  Filled 2021-09-19: qty 5

## 2021-09-19 MED ORDER — CEFAZOLIN SODIUM-DEXTROSE 2-4 GM/100ML-% IV SOLN
2.0000 g | Freq: Once | INTRAVENOUS | Status: AC
  Administered 2021-09-19: 2 g via INTRAVENOUS

## 2021-09-19 MED ORDER — PROPOFOL 1000 MG/100ML IV EMUL
INTRAVENOUS | Status: AC
  Filled 2021-09-19: qty 100

## 2021-09-19 MED ORDER — FENTANYL CITRATE (PF) 100 MCG/2ML IJ SOLN
INTRAMUSCULAR | Status: DC | PRN
  Administered 2021-09-19: 50 ug via INTRAVENOUS

## 2021-09-19 MED ORDER — FLEET ENEMA 7-19 GM/118ML RE ENEM: 1.0000 | ENEMA | Freq: Once | RECTAL | Status: DC

## 2021-09-19 MED ORDER — LACTATED RINGERS IV SOLN: INTRAVENOUS | Status: DC

## 2021-09-19 MED ORDER — AMISULPRIDE (ANTIEMETIC) 5 MG/2ML IV SOLN: 10.0000 mg | Freq: Once | INTRAVENOUS | Status: DC | PRN

## 2021-09-19 MED ORDER — PROPOFOL 500 MG/50ML IV EMUL
INTRAVENOUS | Status: DC | PRN
  Administered 2021-09-19: 200 ug/kg/min via INTRAVENOUS

## 2021-09-19 MED ORDER — LIDOCAINE HCL 1 % IJ SOLN
INTRAMUSCULAR | Status: DC | PRN
  Administered 2021-09-19: 9 mL

## 2021-09-19 MED ORDER — MIDAZOLAM HCL 5 MG/5ML IJ SOLN
INTRAMUSCULAR | Status: DC | PRN
  Administered 2021-09-19: 1 mg via INTRAVENOUS

## 2021-09-19 MED ORDER — SODIUM CHLORIDE (PF) 0.9 % IJ SOLN
INTRAMUSCULAR | Status: DC | PRN
  Administered 2021-09-19: 10 mL via INTRAVENOUS

## 2021-09-19 MED ORDER — FENTANYL CITRATE (PF) 100 MCG/2ML IJ SOLN
INTRAMUSCULAR | Status: AC
  Filled 2021-09-19: qty 2

## 2021-09-19 MED ORDER — ACETAMINOPHEN 500 MG PO TABS
ORAL_TABLET | ORAL | Status: AC
  Filled 2021-09-19: qty 2

## 2021-09-19 SURGICAL SUPPLY — 26 items
BLADE CLIPPER SENSICLIP SURGIC (BLADE) ×2 IMPLANT
CNTNR URN SCR LID CUP LEK RST (MISCELLANEOUS) ×1 IMPLANT
CONT SPEC 4OZ STRL OR WHT (MISCELLANEOUS) ×2
COVER BACK TABLE 60X90IN (DRAPES) ×2 IMPLANT
DRSG IV TEGADERM 3.5X4.5 STRL (GAUZE/BANDAGES/DRESSINGS) ×1 IMPLANT
DRSG TEGADERM 4X4.75 (GAUZE/BANDAGES/DRESSINGS) ×2 IMPLANT
DRSG TEGADERM 8X12 (GAUZE/BANDAGES/DRESSINGS) ×2 IMPLANT
GAUZE SPONGE 4X4 12PLY STRL (GAUZE/BANDAGES/DRESSINGS) ×2 IMPLANT
GAUZE SPONGE 4X4 8PLY NS (GAUZE/BANDAGES/DRESSINGS) ×1 IMPLANT
GLOVE BIO SURGEON STRL SZ7.5 (GLOVE) ×2 IMPLANT
GLOVE SURG ORTHO 8.5 STRL (GLOVE) ×2 IMPLANT
IMPL SPACEOAR VUE SYSTEM (Spacer) ×1 IMPLANT
IMPLANT SPACEOAR VUE SYSTEM (Spacer) ×2 IMPLANT
KIT TURNOVER CYSTO (KITS) ×2 IMPLANT
MARKER GOLD PRELOAD 1.2X3 (Urological Implant) ×1 IMPLANT
MARKER SKIN DUAL TIP RULER LAB (MISCELLANEOUS) ×2 IMPLANT
NDL SPNL 22GX3.5 QUINCKE BK (NEEDLE) ×1 IMPLANT
NEEDLE SPNL 22GX3.5 QUINCKE BK (NEEDLE) ×2 IMPLANT
SEED GOLD PRELOAD 1.2X3 (Urological Implant) ×2 IMPLANT
SHEATH ULTRASOUND LF (SHEATH) IMPLANT
SHEATH ULTRASOUND LTX NONSTRL (SHEATH) ×1 IMPLANT
SURGILUBE 2OZ TUBE FLIPTOP (MISCELLANEOUS) ×2 IMPLANT
SYR 10ML LL (SYRINGE) ×2 IMPLANT
SYR CONTROL 10ML LL (SYRINGE) ×2 IMPLANT
TOWEL OR 17X26 10 PK STRL BLUE (TOWEL DISPOSABLE) ×2 IMPLANT
UNDERPAD 30X36 HEAVY ABSORB (UNDERPADS AND DIAPERS) ×2 IMPLANT

## 2021-09-19 NOTE — Anesthesia Postprocedure Evaluation (Signed)
Anesthesia Post Note ? ?Patient: Chase Combs ? ?Procedure(s) Performed: GOLD SEED IMPLANT (Prostate) ?SPACE OAR INSTILLATION (Prostate) ? ?  ? ?Patient location during evaluation: PACU ?Anesthesia Type: MAC ?Level of consciousness: awake ?Pain management: pain level controlled ?Vital Signs Assessment: post-procedure vital signs reviewed and stable ?Respiratory status: spontaneous breathing, nonlabored ventilation, respiratory function stable and patient connected to nasal cannula oxygen ?Cardiovascular status: stable and blood pressure returned to baseline ?Postop Assessment: no apparent nausea or vomiting ?Anesthetic complications: no ? ? ?No notable events documented. ? ?Last Vitals:  ?Vitals:  ? 09/19/21 0953 09/19/21 1049  ?BP: 109/69 114/79  ?Pulse: 70 66  ?Resp: 16 15  ?Temp: (!) 36.3 ?C 36.6 ?C  ?SpO2: 96% 98%  ?  ?Last Pain:  ?Vitals:  ? 09/19/21 1049  ?TempSrc:   ?PainSc: 0-No pain  ? ? ?  ?  ?  ?  ?  ?  ? ?Jessamine Barcia P Levaeh Vice ? ? ? ? ?

## 2021-09-19 NOTE — Op Note (Signed)
Preoperative diagnosis: Clinically localized adenocarcinoma of the prostate ? ?Postoperative diagnosis: Clinically localized adenocarcinoma of the prostate ? ?Procedure: 1) Placement of fiducial markers into prostate ?                   2) Insertion of SpaceOAR hydrogel  ? ?Surgeon: Louis Meckel, M.D. ? ?Anesthesia: General ? ?EBL: Minimal ? ?Complications: None ? ?Indication: Chase Combs is a 76 y.o. gentleman with clinically localized prostate cancer. After discussing management options for treatment, he elected to proceed with radiotherapy. He presents today for the above procedures. The potential risks, complications, alternative options, and expected recovery course have been discussed in detail with the patient and he has provided informed consent to proceed. ? ?Description of procedure: The patient was administered preoperative antibiotics, placed in the dorsal lithotomy position, and prepped and draped in the usual sterile fashion. Next, transrectal ultrasonography was utilized to visualize the prostate. Three gold fiducial markers were then placed into the prostate via transperineal needles under ultrasound guidance at the right apex, right base, and left mid gland under direct ultrasound guidance. A site in the midline was then selected on the perineum for placement of an 18 g needle with saline. The needle was advanced above the rectum and below Denonvillier's fascia to the mid gland and confirmed to be in the midline on transverse imaging. One cc of saline was injected confirming appropriate expansion of this space. A total of 5 cc of saline was then injected to open the space further bilaterally. The saline syringe was then removed and the SpaceOAR hydrogel was injected with good distribution bilaterally. He tolerated the procedure well and without complications. He was given a voiding trial prior to discharge from the PACU.  ?

## 2021-09-19 NOTE — Transfer of Care (Signed)
Immediate Anesthesia Transfer of Care Note ? ?Patient: Chase Combs ? ?Procedure(s) Performed: GOLD SEED IMPLANT (Prostate) ?SPACE OAR INSTILLATION (Prostate) ? ?Patient Location: PACU ? ?Anesthesia Type:MAC ? ?Level of Consciousness: awake, alert , oriented and patient cooperative ? ?Airway & Oxygen Therapy: Patient Spontanous Breathing and Patient connected to face mask oxygen ? ?Post-op Assessment: Report given to RN and Post -op Vital signs reviewed and stable ? ?Post vital signs: Reviewed and stable ? ?Last Vitals:  ?Vitals Value Taken Time  ?BP 99/86 09/19/21 0930  ?Temp 37.4 ?C 09/19/21 0928  ?Pulse 71 09/19/21 0934  ?Resp 18 09/19/21 0934  ?SpO2 91 % 09/19/21 0934  ?Vitals shown include unvalidated device data. ? ?Last Pain:  ?Vitals:  ? 09/19/21 0928  ?TempSrc:   ?PainSc: 0-No pain  ?   ? ?Patients Stated Pain Goal: 5 (09/19/21 0740) ? ?Complications: No notable events documented. ?

## 2021-09-19 NOTE — Discharge Instructions (Addendum)
For several days the patient:  should increase his fluid intake and limit strenuous activity. he might have mild discomfort at the base of his penis or in his rectum. he might have blood in his urine or blood in his bowel movements.  For 2-3 months he might have blood in his ejaculate (semen).  Call the office immedicately:  for blood clots in the urine or bowel movements,  difficulty urinating,  inability to urinate,  urinary retention,  painful or frequent urination,  fever, chills,  nausea, vomiting, other illness.    [facility]:  [phone]    Post Anesthesia Home Care Instructions  Activity: Get plenty of rest for the remainder of the day. A responsible individual must stay with you for 24 hours following the procedure.  For the next 24 hours, DO NOT: -Drive a car -Operate machinery -Drink alcoholic beverages -Take any medication unless instructed by your physician -Make any legal decisions or sign important papers.  Meals: Start with liquid foods such as gelatin or soup. Progress to regular foods as tolerated. Avoid greasy, spicy, heavy foods. If nausea and/or vomiting occur, drink only clear liquids until the nausea and/or vomiting subsides. Call your physician if vomiting continues.  Special Instructions/Symptoms: Your throat may feel dry or sore from the anesthesia or the breathing tube placed in your throat during surgery. If this causes discomfort, gargle with warm salt water. The discomfort should disappear within 24 hours.       

## 2021-09-19 NOTE — Anesthesia Preprocedure Evaluation (Addendum)
Anesthesia Evaluation  ?Patient identified by MRN, date of birth, ID band ?Patient awake ? ? ? ?Reviewed: ?Allergy & Precautions, NPO status , Patient's Chart, lab work & pertinent test results ? ?Airway ?Mallampati: III ? ?TM Distance: >3 FB ?Neck ROM: Full ? ? ? Dental ? ?(+) Missing ?  ?Pulmonary ?sleep apnea ,  ?  ?Pulmonary exam normal ? ? ? ? ? ? ? Cardiovascular ?hypertension, Pt. on medications and Pt. on home beta blockers ?+ CAD, + Past MI and + Cardiac Stents  ?Normal cardiovascular exam ? ? ?  ?Neuro/Psych ?negative neurological ROS ? negative psych ROS  ? GI/Hepatic ?negative GI ROS, Neg liver ROS,   ?Endo/Other  ?negative endocrine ROS ? Renal/GU ?negative Renal ROS  ? ?  ?Musculoskeletal ? ?(+) Arthritis ,  ? Abdominal ?  ?Peds ? Hematology ?negative hematology ROS ?(+)   ?Anesthesia Other Findings ?PROSTATE CANCER ? Reproductive/Obstetrics ? ?  ? ? ? ? ? ? ? ? ? ? ? ? ? ?  ?  ? ? ? ? ? ? ? ?Anesthesia Physical ?Anesthesia Plan ? ?ASA: 3 ? ?Anesthesia Plan: MAC  ? ?Post-op Pain Management:   ? ?Induction: Intravenous ? ?PONV Risk Score and Plan: 1 and Ondansetron, Dexamethasone, Propofol infusion and Treatment may vary due to age or medical condition ? ?Airway Management Planned: Simple Face Mask ? ?Additional Equipment:  ? ?Intra-op Plan:  ? ?Post-operative Plan:  ? ?Informed Consent: I have reviewed the patients History and Physical, chart, labs and discussed the procedure including the risks, benefits and alternatives for the proposed anesthesia with the patient or authorized representative who has indicated his/her understanding and acceptance.  ? ? ? ?Dental advisory given ? ?Plan Discussed with: CRNA ? ?Anesthesia Plan Comments:   ? ? ? ? ? ?Anesthesia Quick Evaluation ? ?

## 2021-09-19 NOTE — Interval H&P Note (Signed)
History and Physical Interval Note: ? ?09/19/2021 ?8:37 AM ? ?Chase Combs  has presented today for surgery, with the diagnosis of PROSTATE CANCER.  The various methods of treatment have been discussed with the patient and family. After consideration of risks, benefits and other options for treatment, the patient has consented to  Procedure(s) with comments: ?GOLD SEED IMPLANT (N/A) - ONLY NEEDS 30 MIN FOR ALL ?SPACE OAR INSTILLATION (N/A) as a surgical intervention.  The patient's history has been reviewed, patient examined, no change in status, stable for surgery.  I have reviewed the patient's chart and labs.  Questions were answered to the patient's satisfaction.   ? ? ?Ardis Hughs ? ? ?

## 2021-09-19 NOTE — H&P (Signed)
CC: Prostate Cancer  ? ?PCP: Dr. Janett Billow Copland  ?Location of consult: [facility]  ? ?Chase Combs is a47 year old gentleman with a history of CAD s/p cardiac stent placement, hypertension, and hyperlipidemia. He has a long standing history of an elevated PSA and underwent a prostate biopsy in November 2008 for an elevated PSA of 6.47 that was benign. His prostate size was 57.1 cc at that time and he did have bothersome LUTS due to BPH. He was started on tamsulosin but had progression of his symptoms and he was started on finasteride in December 2018. His PSA at that time was 4.78. He was eventually able to discontinue his alpha blocker and continued his 5 ARI. His PSA initially decreased to 3.99 in July 2019 but then increased to 4.32 in July 2020. It was checked again 6 months later and further increased to 7.66 prompting a prostate MRI in January 2021. This indicated no concerning lesions but did indicate significant prostate volume increase to 110 cc as a possible explanation for his PSA even when corrected for 5 ARI use. I offered him a biopsy nonetheless but after discussion, we elected to follow his PSA which actually decreased to 5.32 in August 2021 before again increasing again up to 12.3 on 03/15/21. 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  ? ?Family history: None.  ? ?Imaging studies: PSMA PET scan (06/12/21): He was noted to have focal but intense activity within the left prostate. In addition, there were 2 small left common iliac nodes and a single left periaortic lymph node that had radiotracer activity and consistent with probable metastatic disease.  ? ?PMH: He has a history of CAD s/p cardiac stent placement, hypertension, and hyperlipdemia.  ?PSH: Hernia repair.  ? ?TNM stage: cT1c N0 M0  ?PSA: 24.6 (corrected for 5 ARI use)  ?Gleason score:  4+5=9 (GG 5)  ?Biopsy (05/09/21): /12 cores positive  ?Left: L lateral apex (5%, 4+4=8), L lateral mid (50%, 4+5=9), L mid (10%, 4+5=9), L lateral base (70%, 4+4=8), L base (70%, 4+4=8)  ?Right: Benign  ?Prostate volume: 94.6 cc  ? ?Nomogram  ?OC disease: 5%  ?EPE: 93%  ?SVI: 52%  ?LNI: 48%  ?PFS (5 year, 10 year): 20%, 12%  ? ?Urinary function: IPSS is 14.  ?Erectile function: SHIM score is 13.  ? ?  ?ALLERGIES: Codeine Derivatives ?  ? ?MEDICATIONS: Aspirin  ?Finasteride 5 mg tablet 1 tablet PO Daily  ?Finasteride 5 mg tablet 1 tablet PO Daily  ?Finasteride 5 mg tablet 1 tablet PO Daily  ?Hydrochlorothiazide 25 mg tablet  ?Levofloxacin 750 mg tablet Please take one tablet the morning of your biopsy.  ?Metoprolol Tartrate  ?Amlodipine Besylate 10 mg tablet Oral  ?Crestor 5 mg tablet 0 Oral  ?Multiple Vitamins tablet Oral  ?Rosuvastatin  ?Thiazide  ?Valsartan  ?  ? ?GU PSH: Prostate Needle Biopsy - 05/09/2021 ? ?  ?   ?PSH Notes: Previous Stent Placement, Knee Surgery, Hernia Repair, Back Surgery  ? ?NON-GU PSH: Back Surgery (Unspecified) ?Cardiac Stent Placement ?Hernia Repair - 2008 ?Surgical Pathology, Gross And Microscopic Examination For Prostate Needle - 05/09/2021 ? ?  ? ?GU PMH: Elevated PSA - 05/09/2021, - 03/28/2021, - 07/26/2020, Elevated prostate specific antigen (PSA), - 2014 ?BPH w/LUTS - 03/28/2021, - 07/26/2020, Benign prostatic hyperplasia with urinary obstruction, - 2014 ?Nocturia - 03/28/2021, - 07/26/2020, -  2018 ?Acute Cystitis/UTI, Acute Cystitis - 2014 ?Urinary Retention, Unspec, Acute Urinary Retention - 2014 ?  ?   ?PMH Notes:  ? ?1) Elevated PSA: He was noted to have a PSA of 6.47 in 2008 and underwent a prostate biopsy by Dr. Gaynelle Arabian that was benign.  ? ?Nov 2008: 12 core biopsy - benign, Vol 57.1 cc  ?Jan 2021: MRI (PSA 15.2 corrected) - no concerning lesions, Vol 110 cc  ? ?2) BPH/LUTS: He developed urinary retention in August 2008 and was started on alpha blocker therapy.  ? ?Current  treatment: Finasteride 5 mg (started Dec 2018)  ?Prior treatment: Tamsulosin (able to stop without any change in his symptoms)  ? ?NON-GU PMH: Fracture of unspecified parts of lumbosacral spine and pelvis, initial encounter for closed fracture, Pelvic fracture - 2014 ?Hypertension, Hypertension - 2014 ?Arthritis ?Coronary Artery Disease ?Hypercholesterolemia ?Myocardial Infarction ?Sleep Apnea ?  ? ?FAMILY HISTORY: 2 daughters - Runs in Family ?1 son - Runs in Family ?Chronic Obstructive Pulmonary Disease - Father ?Death In The Family Father - Father ?Death In The Family Mother - Father ?Family Health Status Number - Father ?Lung Cancer - Mother  ? ?SOCIAL HISTORY: Marital Status: Married ?Preferred Language: [place]; Ethnicity: Not Hispanic Or Latino; Race: White ?Current Smoking Status: Patient has never smoked.  ? ?Tobacco Use Assessment Completed: Used Tobacco in last 30 days? ?Does not drink anymore.  ?Drinks 1 caffeinated drink per day. ?  ?  Notes: Never A Smoker, Caffeine Use, Tobacco Use, Alcohol Use, Marital History - Currently Married, Occupation:  ? ?REVIEW OF SYSTEMS:    ?GU Review Male:   Patient denies frequent urination, hard to postpone urination, burning/ pain with urination, get up at night to urinate, leakage of urine, stream starts and stops, trouble starting your streams, and have to strain to urinate .  ?Gastrointestinal (Upper):   Patient denies nausea and vomiting.  ?Gastrointestinal (Lower):   Patient denies diarrhea and constipation.  ?Constitutional:   Patient denies fever, night sweats, weight loss, and fatigue.  ?Skin:   Patient denies skin rash/ lesion and itching.  ?Eyes:   Patient denies blurred vision and double vision.  ?Ears/ Nose/ Throat:   Patient denies sore throat and sinus problems.  ?Hematologic/Lymphatic:   Patient denies swollen glands and easy bruising.  ?Cardiovascular:   Patient denies leg swelling and chest pains.  ?Respiratory:   Patient denies cough and shortness of  breath.  ?Endocrine:   Patient denies excessive thirst.  ?Musculoskeletal:   Patient denies back pain and joint pain.  ?Neurological:   Patient denies headaches and dizziness.  ?Psychologic:   Patient denies depression and anxiety.  ? ?VITAL SIGNS: None  ? ?MULTI-SYSTEM PHYSICAL EXAMINATION:    ?Constitutional: Well-nourished. No physical deformities. Normally developed. Good grooming.  ? ?  ?Complexity of Data:  ?Lab Test Review:   PSA  ?Records Review:   Previous Patient Records  ?X-Ray Review: PET Scan: Reviewed Films.  ?  ? 03/21/21 07/19/20 12/29/19 09/21/19 06/17/19 12/11/18 12/03/17 04/30/17  ?PSA  ?Total PSA 12.30 ng/mL 7.95 ng/mL 5.39 ng/mL 5.92 ng/mL 7.66 ng/mL 4.32 ng/mL 3.99 ng/mL 4.79 ng/mL  ?Free PSA  1.24 ng/mL 0.60 ng/mL 0.61 ng/mL 0.77 ng/mL 0.58 ng/mL  1.61 ng/mL  ?% Free PSA  16 % PSA 11 % PSA 10 % PSA 10 % PSA 13 % PSA  34 % PSA  ? ?Notes:  CLINICAL DATA: Prostate carcinoma with biochemical recurrence. PSA  ?equal 12.3  ? ?EXAM:  ?NUCLEAR MEDICINE PET SKULL BASE TO THIGH  ? ?TECHNIQUE:  ?9.6 mCi F18 Piflufolastat (Pylarify) was injected intravenously.  ?Full-ring PET imaging was performed from the skull base to thigh  ?after the radiotracer. CT data was obtained and used for attenuation  ?correction and anatomic localization.  ? ?COMPARISON: None.  ? ?FINDINGS:  ?NECK  ? ?No radiotracer activity in neck lymph nodes.  ? ?Incidental CT finding: None  ? ?CHEST  ? ?No radiotracer accumulation within mediastinal or hilar lymph nodes.  ?No suspicious pulmonary nodules on the CT scan.  ? ?Incidental CT finding: None  ? ?ABDOMEN/PELVIS  ? ?Prostate: Within the posterior aspect of the LEFT gland peripheral  ?zone, there is a focus of intense radiotracer activity SUV max 19.4.  ?This lesion measures approximately 2 cm x 1 cm.  ? ?Lymph nodes: Small radiotracer avid LEFT common iliac lymph node  ?measures only 5 mm (image 181/4) but has significant radiotracer  ?activity for size with SUV  max equal 5.6 (image 181). Second  ?adjacent similar node on image 176 closer to the bifurcation with  ?SUV max equal 4.0.  ? ?Small LEFT periaortic lymph node measuring only 4 mm (image 164/4)  ?has faint ra

## 2021-09-19 NOTE — Telephone Encounter (Signed)
CALLED PATIENT TO REMIND OF SIM APPT. FOR 09-22-21- ARRIVAL TIME- 1:45 PM @ [facility], INFORMED PATIENT TO ARRIVE WITH A FULL BLADDER AND AN EMPTY BOWEL, SPOKE WITH PATIENT'S WIFE- Victoria  ?

## 2021-09-22 ENCOUNTER — Other Ambulatory Visit: Payer: Self-pay

## 2021-09-22 ENCOUNTER — Ambulatory Visit
Admission: RE | Admit: 2021-09-22 | Discharge: 2021-09-22 | Disposition: A | Discharge status: Home / Self Care | Payer: Medicare Other | Source: Ambulatory Visit | Attending: Radiation Oncology | Admitting: Radiation Oncology

## 2021-09-22 ENCOUNTER — Encounter (HOSPITAL_BASED_OUTPATIENT_CLINIC_OR_DEPARTMENT_OTHER): Payer: Self-pay | Admitting: Urology

## 2021-09-22 DIAGNOSIS — Z510 Encounter for antineoplastic radiation therapy: Secondary | ICD-10-CM | POA: Insufficient documentation

## 2021-09-22 DIAGNOSIS — C61 Malignant neoplasm of prostate: Secondary | ICD-10-CM | POA: Insufficient documentation

## 2021-09-23 NOTE — Progress Notes (Signed)
?  Radiation Oncology         (336) [phone] ?________________________________ ? ?Name: Chase Combs MRN: [id number]  ?Date: 09/22/2021  DOB: 1945-05-27 ? ?SIMULATION AND TREATMENT PLANNING NOTE ? ?  ICD-10-CM   ?1. 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([place])  C61   ?  ? ? ?DIAGNOSIS:  76 y.o. gentleman with stage T1c adenocarcinoma of the prostate with a Gleason's score of 4+5 and a PSA of 24.6, adjusted for finasteride ? ?NARRATIVE:  The patient was brought to the [facility].  Identity was confirmed.  All relevant records and images related to the planned course of therapy were reviewed.  The patient freely provided informed written consent to proceed with treatment after reviewing the details related to the planned course of therapy. The consent form was witnessed and verified by the simulation staff.  Then, the patient was set-up in a stable reproducible supine position for radiation therapy.  A vacuum lock pillow device was custom fabricated to position his legs in a reproducible immobilized position.  Then, I performed a urethrogram under sterile conditions to identify the prostatic bed.  CT images were obtained.  Surface markings were placed.  The CT images were loaded into the planning software.  Then the prostate bed target, pelvic lymph node target and avoidance structures including the rectum, bladder, bowel and hips were contoured.  Treatment planning then occurred.  The radiation prescription was entered and confirmed.  A total of one complex treatment devices were fabricated. I have requested : Intensity Modulated Radiotherapy (IMRT) is medically necessary for this case for the following reason:  Rectal sparing.. ? ?PLAN:  The patient will receive 45 Gy in 25 fractions of 1.8 Gy, followed by a boost to the prostate and PET positive nodes to a total dose of 75 Gy with 15 additional fractions of 2 Gy. ? ? ?________________________________ ? ?Sheral Apley Tammi Klippel, M.D. ? ?

## 2021-09-30 DIAGNOSIS — Z510 Encounter for antineoplastic radiation therapy: Secondary | ICD-10-CM | POA: Diagnosis not present

## 2021-10-02 ENCOUNTER — Other Ambulatory Visit: Payer: Self-pay

## 2021-10-02 ENCOUNTER — Ambulatory Visit
Admission: RE | Admit: 2021-10-02 | Discharge: 2021-10-02 | Disposition: A | Discharge status: Home / Self Care | Payer: Medicare Other | Source: Ambulatory Visit | Attending: Radiation Oncology | Admitting: Radiation Oncology

## 2021-10-02 DIAGNOSIS — Z510 Encounter for antineoplastic radiation therapy: Secondary | ICD-10-CM | POA: Diagnosis not present

## 2021-10-02 LAB — RAD ONC ARIA SESSION SUMMARY
Course Elapsed Days: 0
Plan Fractions Treated to Date: 1
Plan Prescribed Dose Per Fraction: 1.8 Gy
Plan Total Fractions Prescribed: 25
Plan Total Prescribed Dose: 45 Gy
Reference Point Dosage Given to Date: 1.8 Gy
Reference Point Session Dosage Given: 1.8 Gy
Session Number: 1

## 2021-10-03 ENCOUNTER — Ambulatory Visit
Admission: RE | Admit: 2021-10-03 | Discharge: 2021-10-03 | Disposition: A | Discharge status: Home / Self Care | Payer: Medicare Other | Source: Ambulatory Visit | Attending: Radiation Oncology | Admitting: Radiation Oncology

## 2021-10-03 ENCOUNTER — Other Ambulatory Visit: Payer: Self-pay

## 2021-10-03 DIAGNOSIS — Z510 Encounter for antineoplastic radiation therapy: Secondary | ICD-10-CM | POA: Diagnosis not present

## 2021-10-03 LAB — RAD ONC ARIA SESSION SUMMARY
Course Elapsed Days: 1
Plan Fractions Treated to Date: 2
Plan Prescribed Dose Per Fraction: 1.8 Gy
Plan Total Fractions Prescribed: 25
Plan Total Prescribed Dose: 45 Gy
Reference Point Dosage Given to Date: 3.6 Gy
Reference Point Session Dosage Given: 1.8 Gy
Session Number: 2

## 2021-10-06 ENCOUNTER — Ambulatory Visit
Admission: RE | Admit: 2021-10-06 | Discharge: 2021-10-06 | Disposition: A | Discharge status: Home / Self Care | Payer: Medicare Other | Source: Ambulatory Visit | Attending: Radiation Oncology | Admitting: Radiation Oncology

## 2021-10-06 ENCOUNTER — Other Ambulatory Visit: Payer: Self-pay

## 2021-10-06 DIAGNOSIS — Z510 Encounter for antineoplastic radiation therapy: Secondary | ICD-10-CM | POA: Diagnosis not present

## 2021-10-06 LAB — RAD ONC ARIA SESSION SUMMARY
Course Elapsed Days: 4
Plan Fractions Treated to Date: 3
Plan Prescribed Dose Per Fraction: 1.8 Gy
Plan Total Fractions Prescribed: 25
Plan Total Prescribed Dose: 45 Gy
Reference Point Dosage Given to Date: 5.4 Gy
Reference Point Session Dosage Given: 1.8 Gy
Session Number: 3

## 2021-10-07 ENCOUNTER — Other Ambulatory Visit: Payer: Self-pay

## 2021-10-07 ENCOUNTER — Ambulatory Visit
Admission: RE | Admit: 2021-10-07 | Discharge: 2021-10-07 | Disposition: A | Discharge status: Home / Self Care | Payer: Medicare Other | Source: Ambulatory Visit | Attending: Radiation Oncology | Admitting: Radiation Oncology

## 2021-10-07 DIAGNOSIS — Z510 Encounter for antineoplastic radiation therapy: Secondary | ICD-10-CM | POA: Diagnosis not present

## 2021-10-07 LAB — RAD ONC ARIA SESSION SUMMARY
Course Elapsed Days: 5
Plan Fractions Treated to Date: 4
Plan Prescribed Dose Per Fraction: 1.8 Gy
Plan Total Fractions Prescribed: 25
Plan Total Prescribed Dose: 45 Gy
Reference Point Dosage Given to Date: 7.2 Gy
Reference Point Session Dosage Given: 1.8 Gy
Session Number: 4

## 2021-10-08 ENCOUNTER — Other Ambulatory Visit: Payer: Self-pay

## 2021-10-08 ENCOUNTER — Ambulatory Visit
Admission: RE | Admit: 2021-10-08 | Discharge: 2021-10-08 | Disposition: A | Discharge status: Home / Self Care | Payer: Medicare Other | Source: Ambulatory Visit | Attending: Radiation Oncology | Admitting: Radiation Oncology

## 2021-10-08 DIAGNOSIS — Z510 Encounter for antineoplastic radiation therapy: Secondary | ICD-10-CM | POA: Diagnosis not present

## 2021-10-08 LAB — RAD ONC ARIA SESSION SUMMARY
Course Elapsed Days: 6
Plan Fractions Treated to Date: 5
Plan Prescribed Dose Per Fraction: 1.8 Gy
Plan Total Fractions Prescribed: 25
Plan Total Prescribed Dose: 45 Gy
Reference Point Dosage Given to Date: 9 Gy
Reference Point Session Dosage Given: 1.8 Gy
Session Number: 5

## 2021-10-09 ENCOUNTER — Other Ambulatory Visit: Payer: Self-pay

## 2021-10-09 ENCOUNTER — Ambulatory Visit
Admission: RE | Admit: 2021-10-09 | Discharge: 2021-10-09 | Disposition: A | Discharge status: Home / Self Care | Payer: Medicare Other | Source: Ambulatory Visit | Attending: Radiation Oncology | Admitting: Radiation Oncology

## 2021-10-09 DIAGNOSIS — Z510 Encounter for antineoplastic radiation therapy: Secondary | ICD-10-CM | POA: Diagnosis not present

## 2021-10-09 LAB — RAD ONC ARIA SESSION SUMMARY
Course Elapsed Days: 7
Plan Fractions Treated to Date: 6
Plan Prescribed Dose Per Fraction: 1.8 Gy
Plan Total Fractions Prescribed: 25
Plan Total Prescribed Dose: 45 Gy
Reference Point Dosage Given to Date: 10.8 Gy
Reference Point Session Dosage Given: 1.8 Gy
Session Number: 6

## 2021-10-10 ENCOUNTER — Other Ambulatory Visit: Payer: Self-pay

## 2021-10-10 ENCOUNTER — Ambulatory Visit
Admission: RE | Admit: 2021-10-10 | Discharge: 2021-10-10 | Disposition: A | Discharge status: Home / Self Care | Payer: Medicare Other | Source: Ambulatory Visit | Attending: Radiation Oncology | Admitting: Radiation Oncology

## 2021-10-10 DIAGNOSIS — Z510 Encounter for antineoplastic radiation therapy: Secondary | ICD-10-CM | POA: Diagnosis not present

## 2021-10-10 LAB — RAD ONC ARIA SESSION SUMMARY
Course Elapsed Days: 8
Plan Fractions Treated to Date: 7
Plan Prescribed Dose Per Fraction: 1.8 Gy
Plan Total Fractions Prescribed: 25
Plan Total Prescribed Dose: 45 Gy
Reference Point Dosage Given to Date: 12.6 Gy
Reference Point Session Dosage Given: 1.8 Gy
Session Number: 7

## 2021-10-13 ENCOUNTER — Other Ambulatory Visit: Payer: Self-pay

## 2021-10-13 ENCOUNTER — Ambulatory Visit
Admission: RE | Admit: 2021-10-13 | Discharge: 2021-10-13 | Disposition: A | Discharge status: Home / Self Care | Payer: Medicare Other | Source: Ambulatory Visit | Attending: Radiation Oncology | Admitting: Radiation Oncology

## 2021-10-13 DIAGNOSIS — Z510 Encounter for antineoplastic radiation therapy: Secondary | ICD-10-CM | POA: Diagnosis not present

## 2021-10-13 LAB — RAD ONC ARIA SESSION SUMMARY
Course Elapsed Days: 11
Plan Fractions Treated to Date: 8
Plan Prescribed Dose Per Fraction: 1.8 Gy
Plan Total Fractions Prescribed: 25
Plan Total Prescribed Dose: 45 Gy
Reference Point Dosage Given to Date: 14.4 Gy
Reference Point Session Dosage Given: 1.8 Gy
Session Number: 8

## 2021-10-14 ENCOUNTER — Other Ambulatory Visit: Payer: Self-pay

## 2021-10-14 ENCOUNTER — Ambulatory Visit
Admission: RE | Admit: 2021-10-14 | Discharge: 2021-10-14 | Disposition: A | Discharge status: Home / Self Care | Payer: Medicare Other | Source: Ambulatory Visit | Attending: Radiation Oncology | Admitting: Radiation Oncology

## 2021-10-14 DIAGNOSIS — Z510 Encounter for antineoplastic radiation therapy: Secondary | ICD-10-CM | POA: Diagnosis not present

## 2021-10-14 LAB — RAD ONC ARIA SESSION SUMMARY
Course Elapsed Days: 12
Plan Fractions Treated to Date: 9
Plan Prescribed Dose Per Fraction: 1.8 Gy
Plan Total Fractions Prescribed: 25
Plan Total Prescribed Dose: 45 Gy
Reference Point Dosage Given to Date: 16.2 Gy
Reference Point Session Dosage Given: 1.8 Gy
Session Number: 9

## 2021-10-15 ENCOUNTER — Other Ambulatory Visit: Payer: Self-pay

## 2021-10-15 ENCOUNTER — Ambulatory Visit
Admission: RE | Admit: 2021-10-15 | Discharge: 2021-10-15 | Disposition: A | Discharge status: Home / Self Care | Payer: Medicare Other | Source: Ambulatory Visit | Attending: Radiation Oncology | Admitting: Radiation Oncology

## 2021-10-15 DIAGNOSIS — Z510 Encounter for antineoplastic radiation therapy: Secondary | ICD-10-CM | POA: Diagnosis not present

## 2021-10-15 LAB — RAD ONC ARIA SESSION SUMMARY
Course Elapsed Days: 13
Plan Fractions Treated to Date: 10
Plan Prescribed Dose Per Fraction: 1.8 Gy
Plan Total Fractions Prescribed: 25
Plan Total Prescribed Dose: 45 Gy
Reference Point Dosage Given to Date: 18 Gy
Reference Point Session Dosage Given: 1.8 Gy
Session Number: 10

## 2021-10-16 ENCOUNTER — Ambulatory Visit
Admission: RE | Admit: 2021-10-16 | Discharge: 2021-10-16 | Disposition: A | Discharge status: Home / Self Care | Payer: Medicare Other | Source: Ambulatory Visit | Attending: Radiation Oncology | Admitting: Radiation Oncology

## 2021-10-16 ENCOUNTER — Other Ambulatory Visit: Payer: Self-pay

## 2021-10-16 DIAGNOSIS — Z510 Encounter for antineoplastic radiation therapy: Secondary | ICD-10-CM | POA: Diagnosis not present

## 2021-10-16 LAB — RAD ONC ARIA SESSION SUMMARY
Course Elapsed Days: 14
Plan Fractions Treated to Date: 11
Plan Prescribed Dose Per Fraction: 1.8 Gy
Plan Total Fractions Prescribed: 25
Plan Total Prescribed Dose: 45 Gy
Reference Point Dosage Given to Date: 19.8 Gy
Reference Point Session Dosage Given: 1.8 Gy
Session Number: 11

## 2021-10-17 ENCOUNTER — Ambulatory Visit
Admission: RE | Admit: 2021-10-17 | Discharge: 2021-10-17 | Disposition: A | Discharge status: Home / Self Care | Payer: Medicare Other | Source: Ambulatory Visit | Attending: Radiation Oncology | Admitting: Radiation Oncology

## 2021-10-17 ENCOUNTER — Other Ambulatory Visit: Payer: Self-pay

## 2021-10-17 DIAGNOSIS — Z510 Encounter for antineoplastic radiation therapy: Secondary | ICD-10-CM | POA: Diagnosis not present

## 2021-10-17 LAB — RAD ONC ARIA SESSION SUMMARY
Course Elapsed Days: 15
Plan Fractions Treated to Date: 12
Plan Prescribed Dose Per Fraction: 1.8 Gy
Plan Total Fractions Prescribed: 25
Plan Total Prescribed Dose: 45 Gy
Reference Point Dosage Given to Date: 21.6 Gy
Reference Point Session Dosage Given: 1.8 Gy
Session Number: 12

## 2021-10-21 ENCOUNTER — Other Ambulatory Visit: Payer: Self-pay

## 2021-10-21 ENCOUNTER — Ambulatory Visit
Admission: RE | Admit: 2021-10-21 | Discharge: 2021-10-21 | Disposition: A | Discharge status: Home / Self Care | Payer: Medicare Other | Source: Ambulatory Visit | Attending: Radiation Oncology | Admitting: Radiation Oncology

## 2021-10-21 DIAGNOSIS — Z510 Encounter for antineoplastic radiation therapy: Secondary | ICD-10-CM | POA: Diagnosis not present

## 2021-10-21 LAB — RAD ONC ARIA SESSION SUMMARY
Course Elapsed Days: 19
Plan Fractions Treated to Date: 13
Plan Prescribed Dose Per Fraction: 1.8 Gy
Plan Total Fractions Prescribed: 25
Plan Total Prescribed Dose: 45 Gy
Reference Point Dosage Given to Date: 23.4 Gy
Reference Point Session Dosage Given: 1.8 Gy
Session Number: 13

## 2021-10-22 ENCOUNTER — Other Ambulatory Visit: Payer: Self-pay

## 2021-10-22 ENCOUNTER — Ambulatory Visit
Admission: RE | Admit: 2021-10-22 | Discharge: 2021-10-22 | Disposition: A | Discharge status: Home / Self Care | Payer: Medicare Other | Source: Ambulatory Visit | Attending: Radiation Oncology | Admitting: Radiation Oncology

## 2021-10-22 DIAGNOSIS — Z510 Encounter for antineoplastic radiation therapy: Secondary | ICD-10-CM | POA: Diagnosis not present

## 2021-10-22 LAB — RAD ONC ARIA SESSION SUMMARY
Course Elapsed Days: 20
Plan Fractions Treated to Date: 14
Plan Prescribed Dose Per Fraction: 1.8 Gy
Plan Total Fractions Prescribed: 25
Plan Total Prescribed Dose: 45 Gy
Reference Point Dosage Given to Date: 25.2 Gy
Reference Point Session Dosage Given: 1.8 Gy
Session Number: 14

## 2021-10-23 ENCOUNTER — Other Ambulatory Visit: Payer: Self-pay

## 2021-10-23 ENCOUNTER — Ambulatory Visit
Admission: RE | Admit: 2021-10-23 | Discharge: 2021-10-23 | Disposition: A | Discharge status: Home / Self Care | Payer: Medicare Other | Source: Ambulatory Visit | Attending: Radiation Oncology | Admitting: Radiation Oncology

## 2021-10-23 DIAGNOSIS — C61 Malignant neoplasm of prostate: Secondary | ICD-10-CM | POA: Insufficient documentation

## 2021-10-23 DIAGNOSIS — Z510 Encounter for antineoplastic radiation therapy: Secondary | ICD-10-CM | POA: Insufficient documentation

## 2021-10-23 LAB — RAD ONC ARIA SESSION SUMMARY
Course Elapsed Days: 21
Plan Fractions Treated to Date: 15
Plan Prescribed Dose Per Fraction: 1.8 Gy
Plan Total Fractions Prescribed: 25
Plan Total Prescribed Dose: 45 Gy
Reference Point Dosage Given to Date: 27 Gy
Reference Point Session Dosage Given: 1.8 Gy
Session Number: 15

## 2021-10-24 ENCOUNTER — Other Ambulatory Visit: Payer: Self-pay

## 2021-10-24 ENCOUNTER — Ambulatory Visit
Admission: RE | Admit: 2021-10-24 | Discharge: 2021-10-24 | Disposition: A | Discharge status: Home / Self Care | Payer: Medicare Other | Source: Ambulatory Visit | Attending: Radiation Oncology | Admitting: Radiation Oncology

## 2021-10-24 DIAGNOSIS — Z510 Encounter for antineoplastic radiation therapy: Secondary | ICD-10-CM | POA: Diagnosis not present

## 2021-10-24 LAB — RAD ONC ARIA SESSION SUMMARY
Course Elapsed Days: 22
Plan Fractions Treated to Date: 16
Plan Prescribed Dose Per Fraction: 1.8 Gy
Plan Total Fractions Prescribed: 25
Plan Total Prescribed Dose: 45 Gy
Reference Point Dosage Given to Date: 28.8 Gy
Reference Point Session Dosage Given: 1.8 Gy
Session Number: 16

## 2021-10-27 ENCOUNTER — Ambulatory Visit
Admission: RE | Admit: 2021-10-27 | Discharge: 2021-10-27 | Disposition: A | Discharge status: Home / Self Care | Payer: Medicare Other | Source: Ambulatory Visit | Attending: Radiation Oncology | Admitting: Radiation Oncology

## 2021-10-27 ENCOUNTER — Other Ambulatory Visit: Payer: Self-pay

## 2021-10-27 DIAGNOSIS — Z510 Encounter for antineoplastic radiation therapy: Secondary | ICD-10-CM | POA: Diagnosis not present

## 2021-10-27 LAB — RAD ONC ARIA SESSION SUMMARY
Course Elapsed Days: 25
Plan Fractions Treated to Date: 17
Plan Prescribed Dose Per Fraction: 1.8 Gy
Plan Total Fractions Prescribed: 25
Plan Total Prescribed Dose: 45 Gy
Reference Point Dosage Given to Date: 30.6 Gy
Reference Point Session Dosage Given: 1.8 Gy
Session Number: 17

## 2021-10-28 ENCOUNTER — Ambulatory Visit
Admission: RE | Admit: 2021-10-28 | Discharge: 2021-10-28 | Disposition: A | Discharge status: Home / Self Care | Payer: Medicare Other | Source: Ambulatory Visit | Attending: Radiation Oncology | Admitting: Radiation Oncology

## 2021-10-28 ENCOUNTER — Other Ambulatory Visit: Payer: Self-pay

## 2021-10-28 DIAGNOSIS — Z510 Encounter for antineoplastic radiation therapy: Secondary | ICD-10-CM | POA: Diagnosis not present

## 2021-10-28 LAB — RAD ONC ARIA SESSION SUMMARY
Course Elapsed Days: 26
Plan Fractions Treated to Date: 18
Plan Prescribed Dose Per Fraction: 1.8 Gy
Plan Total Fractions Prescribed: 25
Plan Total Prescribed Dose: 45 Gy
Reference Point Dosage Given to Date: 32.4 Gy
Reference Point Session Dosage Given: 1.8 Gy
Session Number: 18

## 2021-10-29 ENCOUNTER — Other Ambulatory Visit: Payer: Self-pay

## 2021-10-29 ENCOUNTER — Ambulatory Visit
Admission: RE | Admit: 2021-10-29 | Discharge: 2021-10-29 | Disposition: A | Discharge status: Home / Self Care | Payer: Medicare Other | Source: Ambulatory Visit | Attending: Radiation Oncology | Admitting: Radiation Oncology

## 2021-10-29 DIAGNOSIS — Z510 Encounter for antineoplastic radiation therapy: Secondary | ICD-10-CM | POA: Diagnosis not present

## 2021-10-29 LAB — RAD ONC ARIA SESSION SUMMARY
Course Elapsed Days: 27
Plan Fractions Treated to Date: 19
Plan Prescribed Dose Per Fraction: 1.8 Gy
Plan Total Fractions Prescribed: 25
Plan Total Prescribed Dose: 45 Gy
Reference Point Dosage Given to Date: 34.2 Gy
Reference Point Session Dosage Given: 1.8 Gy
Session Number: 19

## 2021-10-30 ENCOUNTER — Ambulatory Visit
Admission: RE | Admit: 2021-10-30 | Discharge: 2021-10-30 | Disposition: A | Discharge status: Home / Self Care | Payer: Medicare Other | Source: Ambulatory Visit | Attending: Radiation Oncology | Admitting: Radiation Oncology

## 2021-10-30 ENCOUNTER — Other Ambulatory Visit: Payer: Self-pay

## 2021-10-30 DIAGNOSIS — Z510 Encounter for antineoplastic radiation therapy: Secondary | ICD-10-CM | POA: Diagnosis not present

## 2021-10-30 LAB — RAD ONC ARIA SESSION SUMMARY
Course Elapsed Days: 28
Plan Fractions Treated to Date: 20
Plan Prescribed Dose Per Fraction: 1.8 Gy
Plan Total Fractions Prescribed: 25
Plan Total Prescribed Dose: 45 Gy
Reference Point Dosage Given to Date: 36 Gy
Reference Point Session Dosage Given: 1.8 Gy
Session Number: 20

## 2021-10-31 ENCOUNTER — Ambulatory Visit
Admission: RE | Admit: 2021-10-31 | Discharge: 2021-10-31 | Disposition: A | Discharge status: Home / Self Care | Payer: Medicare Other | Source: Ambulatory Visit | Attending: Radiation Oncology | Admitting: Radiation Oncology

## 2021-10-31 ENCOUNTER — Other Ambulatory Visit: Payer: Self-pay | Admitting: Cardiology

## 2021-10-31 ENCOUNTER — Other Ambulatory Visit: Payer: Self-pay

## 2021-10-31 DIAGNOSIS — Z510 Encounter for antineoplastic radiation therapy: Secondary | ICD-10-CM | POA: Diagnosis not present

## 2021-10-31 LAB — RAD ONC ARIA SESSION SUMMARY
Course Elapsed Days: 29
Plan Fractions Treated to Date: 21
Plan Prescribed Dose Per Fraction: 1.8 Gy
Plan Total Fractions Prescribed: 25
Plan Total Prescribed Dose: 45 Gy
Reference Point Dosage Given to Date: 37.8 Gy
Reference Point Session Dosage Given: 1.8 Gy
Session Number: 21

## 2021-11-03 ENCOUNTER — Other Ambulatory Visit: Payer: Self-pay

## 2021-11-03 ENCOUNTER — Ambulatory Visit
Admission: RE | Admit: 2021-11-03 | Discharge: 2021-11-03 | Disposition: A | Discharge status: Home / Self Care | Payer: Medicare Other | Source: Ambulatory Visit | Attending: Radiation Oncology | Admitting: Radiation Oncology

## 2021-11-03 DIAGNOSIS — Z510 Encounter for antineoplastic radiation therapy: Secondary | ICD-10-CM | POA: Diagnosis not present

## 2021-11-03 LAB — RAD ONC ARIA SESSION SUMMARY
Course Elapsed Days: 32
Plan Fractions Treated to Date: 22
Plan Prescribed Dose Per Fraction: 1.8 Gy
Plan Total Fractions Prescribed: 25
Plan Total Prescribed Dose: 45 Gy
Reference Point Dosage Given to Date: 39.6 Gy
Reference Point Session Dosage Given: 1.8 Gy
Session Number: 22

## 2021-11-04 ENCOUNTER — Ambulatory Visit
Admission: RE | Admit: 2021-11-04 | Discharge: 2021-11-04 | Disposition: A | Discharge status: Home / Self Care | Payer: Medicare Other | Source: Ambulatory Visit | Attending: Radiation Oncology | Admitting: Radiation Oncology

## 2021-11-04 ENCOUNTER — Other Ambulatory Visit: Payer: Self-pay

## 2021-11-04 DIAGNOSIS — Z510 Encounter for antineoplastic radiation therapy: Secondary | ICD-10-CM | POA: Diagnosis not present

## 2021-11-04 LAB — RAD ONC ARIA SESSION SUMMARY
Course Elapsed Days: 33
Plan Fractions Treated to Date: 23
Plan Prescribed Dose Per Fraction: 1.8 Gy
Plan Total Fractions Prescribed: 25
Plan Total Prescribed Dose: 45 Gy
Reference Point Dosage Given to Date: 41.4 Gy
Reference Point Session Dosage Given: 1.8 Gy
Session Number: 23

## 2021-11-05 ENCOUNTER — Ambulatory Visit
Admission: RE | Admit: 2021-11-05 | Discharge: 2021-11-05 | Disposition: A | Discharge status: Home / Self Care | Payer: Medicare Other | Source: Ambulatory Visit | Attending: Radiation Oncology | Admitting: Radiation Oncology

## 2021-11-05 ENCOUNTER — Other Ambulatory Visit: Payer: Self-pay

## 2021-11-05 DIAGNOSIS — Z510 Encounter for antineoplastic radiation therapy: Secondary | ICD-10-CM | POA: Diagnosis not present

## 2021-11-05 LAB — RAD ONC ARIA SESSION SUMMARY
Course Elapsed Days: 34
Plan Fractions Treated to Date: 24
Plan Prescribed Dose Per Fraction: 1.8 Gy
Plan Total Fractions Prescribed: 25
Plan Total Prescribed Dose: 45 Gy
Reference Point Dosage Given to Date: 43.2 Gy
Reference Point Session Dosage Given: 1.8 Gy
Session Number: 24

## 2021-11-06 ENCOUNTER — Ambulatory Visit: Payer: Medicare Other

## 2021-11-06 ENCOUNTER — Ambulatory Visit
Admission: RE | Admit: 2021-11-06 | Discharge: 2021-11-06 | Disposition: A | Discharge status: Home / Self Care | Payer: Medicare Other | Source: Ambulatory Visit | Attending: Radiation Oncology | Admitting: Radiation Oncology

## 2021-11-06 ENCOUNTER — Other Ambulatory Visit: Payer: Self-pay

## 2021-11-06 DIAGNOSIS — Z510 Encounter for antineoplastic radiation therapy: Secondary | ICD-10-CM | POA: Diagnosis not present

## 2021-11-06 LAB — RAD ONC ARIA SESSION SUMMARY
Course Elapsed Days: 35
Plan Fractions Treated to Date: 25
Plan Prescribed Dose Per Fraction: 1.8 Gy
Plan Total Fractions Prescribed: 25
Plan Total Prescribed Dose: 45 Gy
Reference Point Dosage Given to Date: 45 Gy
Reference Point Session Dosage Given: 1.8 Gy
Session Number: 25

## 2021-11-07 ENCOUNTER — Other Ambulatory Visit: Payer: Self-pay

## 2021-11-07 ENCOUNTER — Ambulatory Visit: Payer: Medicare Other

## 2021-11-07 ENCOUNTER — Ambulatory Visit
Admission: RE | Admit: 2021-11-07 | Discharge: 2021-11-07 | Disposition: A | Discharge status: Home / Self Care | Payer: Medicare Other | Source: Ambulatory Visit | Attending: Radiation Oncology | Admitting: Radiation Oncology

## 2021-11-07 DIAGNOSIS — Z510 Encounter for antineoplastic radiation therapy: Secondary | ICD-10-CM | POA: Diagnosis not present

## 2021-11-07 LAB — RAD ONC ARIA SESSION SUMMARY
Course Elapsed Days: 36
Plan Fractions Treated to Date: 1
Plan Prescribed Dose Per Fraction: 2 Gy
Plan Total Fractions Prescribed: 15
Plan Total Prescribed Dose: 30 Gy
Reference Point Dosage Given to Date: 47 Gy
Reference Point Session Dosage Given: 2 Gy
Session Number: 26

## 2021-11-10 ENCOUNTER — Ambulatory Visit
Admission: RE | Admit: 2021-11-10 | Discharge: 2021-11-10 | Disposition: A | Discharge status: Home / Self Care | Payer: Medicare Other | Source: Ambulatory Visit | Attending: Radiation Oncology | Admitting: Radiation Oncology

## 2021-11-10 ENCOUNTER — Ambulatory Visit: Payer: Medicare Other

## 2021-11-10 ENCOUNTER — Other Ambulatory Visit: Payer: Self-pay

## 2021-11-10 DIAGNOSIS — Z510 Encounter for antineoplastic radiation therapy: Secondary | ICD-10-CM | POA: Diagnosis not present

## 2021-11-10 LAB — RAD ONC ARIA SESSION SUMMARY
Course Elapsed Days: 39
Plan Fractions Treated to Date: 2
Plan Prescribed Dose Per Fraction: 2 Gy
Plan Total Fractions Prescribed: 15
Plan Total Prescribed Dose: 30 Gy
Reference Point Dosage Given to Date: 49 Gy
Reference Point Session Dosage Given: 2 Gy
Session Number: 27

## 2021-11-11 ENCOUNTER — Ambulatory Visit
Admission: RE | Admit: 2021-11-11 | Discharge: 2021-11-11 | Disposition: A | Discharge status: Home / Self Care | Payer: Medicare Other | Source: Ambulatory Visit | Attending: Radiation Oncology | Admitting: Radiation Oncology

## 2021-11-11 ENCOUNTER — Other Ambulatory Visit: Payer: Self-pay

## 2021-11-11 DIAGNOSIS — Z510 Encounter for antineoplastic radiation therapy: Secondary | ICD-10-CM | POA: Diagnosis not present

## 2021-11-11 LAB — RAD ONC ARIA SESSION SUMMARY
Course Elapsed Days: 40
Plan Fractions Treated to Date: 3
Plan Prescribed Dose Per Fraction: 2 Gy
Plan Total Fractions Prescribed: 15
Plan Total Prescribed Dose: 30 Gy
Reference Point Dosage Given to Date: 51 Gy
Reference Point Session Dosage Given: 2 Gy
Session Number: 28

## 2021-11-12 ENCOUNTER — Ambulatory Visit
Admission: RE | Admit: 2021-11-12 | Discharge: 2021-11-12 | Disposition: A | Discharge status: Home / Self Care | Payer: Medicare Other | Source: Ambulatory Visit | Attending: Radiation Oncology | Admitting: Radiation Oncology

## 2021-11-12 ENCOUNTER — Other Ambulatory Visit: Payer: Self-pay

## 2021-11-12 ENCOUNTER — Other Ambulatory Visit: Payer: Self-pay | Admitting: Cardiology

## 2021-11-12 DIAGNOSIS — Z510 Encounter for antineoplastic radiation therapy: Secondary | ICD-10-CM | POA: Diagnosis not present

## 2021-11-12 LAB — RAD ONC ARIA SESSION SUMMARY
Course Elapsed Days: 41
Plan Fractions Treated to Date: 4
Plan Prescribed Dose Per Fraction: 2 Gy
Plan Total Fractions Prescribed: 15
Plan Total Prescribed Dose: 30 Gy
Reference Point Dosage Given to Date: 53 Gy
Reference Point Session Dosage Given: 2 Gy
Session Number: 29

## 2021-11-13 ENCOUNTER — Other Ambulatory Visit: Payer: Self-pay

## 2021-11-13 ENCOUNTER — Ambulatory Visit
Admission: RE | Admit: 2021-11-13 | Discharge: 2021-11-13 | Disposition: A | Discharge status: Home / Self Care | Payer: Medicare Other | Source: Ambulatory Visit | Attending: Radiation Oncology | Admitting: Radiation Oncology

## 2021-11-13 DIAGNOSIS — Z510 Encounter for antineoplastic radiation therapy: Secondary | ICD-10-CM | POA: Diagnosis not present

## 2021-11-13 LAB — RAD ONC ARIA SESSION SUMMARY
Course Elapsed Days: 42
Plan Fractions Treated to Date: 5
Plan Prescribed Dose Per Fraction: 2 Gy
Plan Total Fractions Prescribed: 15
Plan Total Prescribed Dose: 30 Gy
Reference Point Dosage Given to Date: 55 Gy
Reference Point Session Dosage Given: 2 Gy
Session Number: 30

## 2021-11-14 ENCOUNTER — Ambulatory Visit
Admission: RE | Admit: 2021-11-14 | Discharge: 2021-11-14 | Disposition: A | Discharge status: Home / Self Care | Payer: Medicare Other | Source: Ambulatory Visit | Attending: Radiation Oncology | Admitting: Radiation Oncology

## 2021-11-14 ENCOUNTER — Other Ambulatory Visit: Payer: Self-pay

## 2021-11-14 DIAGNOSIS — Z510 Encounter for antineoplastic radiation therapy: Secondary | ICD-10-CM | POA: Diagnosis not present

## 2021-11-14 LAB — RAD ONC ARIA SESSION SUMMARY
Course Elapsed Days: 43
Plan Fractions Treated to Date: 6
Plan Prescribed Dose Per Fraction: 2 Gy
Plan Total Fractions Prescribed: 15
Plan Total Prescribed Dose: 30 Gy
Reference Point Dosage Given to Date: 57 Gy
Reference Point Session Dosage Given: 2 Gy
Session Number: 31

## 2021-11-17 ENCOUNTER — Other Ambulatory Visit: Payer: Self-pay | Admitting: Radiation Oncology

## 2021-11-17 ENCOUNTER — Other Ambulatory Visit: Payer: Self-pay

## 2021-11-17 ENCOUNTER — Ambulatory Visit
Admission: RE | Admit: 2021-11-17 | Discharge: 2021-11-17 | Disposition: A | Discharge status: Home / Self Care | Payer: Medicare Other | Source: Ambulatory Visit | Attending: Radiation Oncology | Admitting: Radiation Oncology

## 2021-11-17 DIAGNOSIS — Z510 Encounter for antineoplastic radiation therapy: Secondary | ICD-10-CM | POA: Diagnosis not present

## 2021-11-17 LAB — RAD ONC ARIA SESSION SUMMARY
Course Elapsed Days: 46
Plan Fractions Treated to Date: 7
Plan Prescribed Dose Per Fraction: 2 Gy
Plan Total Fractions Prescribed: 15
Plan Total Prescribed Dose: 30 Gy
Reference Point Dosage Given to Date: 59 Gy
Reference Point Session Dosage Given: 2 Gy
Session Number: 32

## 2021-11-17 MED ORDER — TAMSULOSIN HCL 0.4 MG PO CAPS: 0.4000 mg | ORAL_CAPSULE | Freq: Every day | ORAL | 5 refills | Status: DC

## 2021-11-18 ENCOUNTER — Other Ambulatory Visit: Payer: Self-pay

## 2021-11-18 ENCOUNTER — Ambulatory Visit
Admission: RE | Admit: 2021-11-18 | Discharge: 2021-11-18 | Disposition: A | Discharge status: Home / Self Care | Payer: Medicare Other | Source: Ambulatory Visit | Attending: Radiation Oncology | Admitting: Radiation Oncology

## 2021-11-18 DIAGNOSIS — Z510 Encounter for antineoplastic radiation therapy: Secondary | ICD-10-CM | POA: Diagnosis not present

## 2021-11-18 LAB — RAD ONC ARIA SESSION SUMMARY
Course Elapsed Days: 47
Plan Fractions Treated to Date: 8
Plan Prescribed Dose Per Fraction: 2 Gy
Plan Total Fractions Prescribed: 15
Plan Total Prescribed Dose: 30 Gy
Reference Point Dosage Given to Date: 61 Gy
Reference Point Session Dosage Given: 2 Gy
Session Number: 33

## 2021-11-19 ENCOUNTER — Ambulatory Visit
Admission: RE | Admit: 2021-11-19 | Discharge: 2021-11-19 | Disposition: A | Discharge status: Home / Self Care | Payer: Medicare Other | Source: Ambulatory Visit | Attending: Radiation Oncology | Admitting: Radiation Oncology

## 2021-11-19 ENCOUNTER — Other Ambulatory Visit: Payer: Self-pay

## 2021-11-19 DIAGNOSIS — Z510 Encounter for antineoplastic radiation therapy: Secondary | ICD-10-CM | POA: Diagnosis not present

## 2021-11-19 LAB — RAD ONC ARIA SESSION SUMMARY
Course Elapsed Days: 48
Plan Fractions Treated to Date: 9
Plan Prescribed Dose Per Fraction: 2 Gy
Plan Total Fractions Prescribed: 15
Plan Total Prescribed Dose: 30 Gy
Reference Point Dosage Given to Date: 63 Gy
Reference Point Session Dosage Given: 2 Gy
Session Number: 34

## 2021-11-20 ENCOUNTER — Other Ambulatory Visit: Payer: Self-pay

## 2021-11-20 ENCOUNTER — Ambulatory Visit
Admission: RE | Admit: 2021-11-20 | Discharge: 2021-11-20 | Disposition: A | Discharge status: Home / Self Care | Payer: Medicare Other | Source: Ambulatory Visit | Attending: Radiation Oncology | Admitting: Radiation Oncology

## 2021-11-20 DIAGNOSIS — Z510 Encounter for antineoplastic radiation therapy: Secondary | ICD-10-CM | POA: Diagnosis not present

## 2021-11-20 LAB — RAD ONC ARIA SESSION SUMMARY
Course Elapsed Days: 49
Plan Fractions Treated to Date: 10
Plan Prescribed Dose Per Fraction: 2 Gy
Plan Total Fractions Prescribed: 15
Plan Total Prescribed Dose: 30 Gy
Reference Point Dosage Given to Date: 65 Gy
Reference Point Session Dosage Given: 2 Gy
Session Number: 35

## 2021-11-21 ENCOUNTER — Other Ambulatory Visit: Payer: Self-pay

## 2021-11-21 ENCOUNTER — Ambulatory Visit
Admission: RE | Admit: 2021-11-21 | Discharge: 2021-11-21 | Disposition: A | Discharge status: Home / Self Care | Payer: Medicare Other | Source: Ambulatory Visit | Attending: Radiation Oncology | Admitting: Radiation Oncology

## 2021-11-21 DIAGNOSIS — Z510 Encounter for antineoplastic radiation therapy: Secondary | ICD-10-CM | POA: Diagnosis not present

## 2021-11-21 LAB — RAD ONC ARIA SESSION SUMMARY
Course Elapsed Days: 50
Plan Fractions Treated to Date: 11
Plan Prescribed Dose Per Fraction: 2 Gy
Plan Total Fractions Prescribed: 15
Plan Total Prescribed Dose: 30 Gy
Reference Point Dosage Given to Date: 67 Gy
Reference Point Session Dosage Given: 2 Gy
Session Number: 36

## 2021-11-24 ENCOUNTER — Other Ambulatory Visit: Payer: Self-pay

## 2021-11-24 ENCOUNTER — Ambulatory Visit
Admission: RE | Admit: 2021-11-24 | Discharge: 2021-11-24 | Disposition: A | Discharge status: Home / Self Care | Payer: Medicare Other | Source: Ambulatory Visit | Attending: Radiation Oncology | Admitting: Radiation Oncology

## 2021-11-24 DIAGNOSIS — C61 Malignant neoplasm of prostate: Secondary | ICD-10-CM | POA: Insufficient documentation

## 2021-11-24 DIAGNOSIS — Z510 Encounter for antineoplastic radiation therapy: Secondary | ICD-10-CM | POA: Insufficient documentation

## 2021-11-24 LAB — RAD ONC ARIA SESSION SUMMARY
Course Elapsed Days: 53
Plan Fractions Treated to Date: 12
Plan Prescribed Dose Per Fraction: 2 Gy
Plan Total Fractions Prescribed: 15
Plan Total Prescribed Dose: 30 Gy
Reference Point Dosage Given to Date: 69 Gy
Reference Point Session Dosage Given: 2 Gy
Session Number: 37

## 2021-11-26 ENCOUNTER — Other Ambulatory Visit: Payer: Self-pay

## 2021-11-26 ENCOUNTER — Ambulatory Visit
Admission: RE | Admit: 2021-11-26 | Discharge: 2021-11-26 | Disposition: A | Discharge status: Home / Self Care | Payer: Medicare Other | Source: Ambulatory Visit | Attending: Radiation Oncology | Admitting: Radiation Oncology

## 2021-11-26 DIAGNOSIS — Z510 Encounter for antineoplastic radiation therapy: Secondary | ICD-10-CM | POA: Diagnosis not present

## 2021-11-26 LAB — RAD ONC ARIA SESSION SUMMARY
Course Elapsed Days: 55
Plan Fractions Treated to Date: 13
Plan Prescribed Dose Per Fraction: 2 Gy
Plan Total Fractions Prescribed: 15
Plan Total Prescribed Dose: 30 Gy
Reference Point Dosage Given to Date: 71 Gy
Reference Point Session Dosage Given: 2 Gy
Session Number: 38

## 2021-11-27 ENCOUNTER — Other Ambulatory Visit: Payer: Self-pay

## 2021-11-27 ENCOUNTER — Ambulatory Visit
Admission: RE | Admit: 2021-11-27 | Discharge: 2021-11-27 | Disposition: A | Discharge status: Home / Self Care | Payer: Medicare Other | Source: Ambulatory Visit | Attending: Radiation Oncology | Admitting: Radiation Oncology

## 2021-11-27 DIAGNOSIS — Z510 Encounter for antineoplastic radiation therapy: Secondary | ICD-10-CM | POA: Diagnosis not present

## 2021-11-27 LAB — RAD ONC ARIA SESSION SUMMARY
Course Elapsed Days: 56
Plan Fractions Treated to Date: 14
Plan Prescribed Dose Per Fraction: 2 Gy
Plan Total Fractions Prescribed: 15
Plan Total Prescribed Dose: 30 Gy
Reference Point Dosage Given to Date: 73 Gy
Reference Point Session Dosage Given: 2 Gy
Session Number: 39

## 2021-11-28 ENCOUNTER — Ambulatory Visit
Admission: RE | Admit: 2021-11-28 | Discharge: 2021-11-28 | Disposition: A | Discharge status: Home / Self Care | Payer: Medicare Other | Source: Ambulatory Visit | Attending: Radiation Oncology | Admitting: Radiation Oncology

## 2021-11-28 ENCOUNTER — Other Ambulatory Visit: Payer: Self-pay

## 2021-11-28 ENCOUNTER — Encounter: Payer: Self-pay | Admitting: Urology

## 2021-11-28 DIAGNOSIS — Z510 Encounter for antineoplastic radiation therapy: Secondary | ICD-10-CM | POA: Diagnosis not present

## 2021-11-28 DIAGNOSIS — C61 Malignant neoplasm of prostate: Secondary | ICD-10-CM

## 2021-11-28 LAB — RAD ONC ARIA SESSION SUMMARY
Course Elapsed Days: 57
Plan Fractions Treated to Date: 15
Plan Prescribed Dose Per Fraction: 2 Gy
Plan Total Fractions Prescribed: 15
Plan Total Prescribed Dose: 30 Gy
Reference Point Dosage Given to Date: 75 Gy
Reference Point Session Dosage Given: 2 Gy
Session Number: 40

## 2022-01-08 DIAGNOSIS — M1711 Unilateral primary osteoarthritis, right knee: Secondary | ICD-10-CM | POA: Diagnosis not present

## 2022-01-08 DIAGNOSIS — M25562 Pain in left knee: Secondary | ICD-10-CM | POA: Diagnosis not present

## 2022-01-14 ENCOUNTER — Encounter: Payer: Self-pay | Admitting: Urology

## 2022-01-14 NOTE — Progress Notes (Signed)
Telephone appointment. I verified patient's identity and began nursing interview. Patient reports extreme urinary urgency x every hr, which severely impacts patient's sleep. No other issues reported at this time.  Meaningful use complete. I-PSS score of 11-moderate. Flomax as directed. Urology appt- Aug 2023  Reminded patient of his 9:30am-01/15/22 telephone appointment w/ Ashlyn Bruning PA-C. I left my extension [phone] in case patient needs anything. Patient verbalized understanding.  Patient contact [phone]

## 2022-01-15 ENCOUNTER — Ambulatory Visit
Admission: RE | Admit: 2022-01-15 | Discharge: 2022-01-15 | Disposition: A | Discharge status: Home / Self Care | Payer: Medicare Other | Source: Ambulatory Visit | Attending: Urology | Admitting: Urology

## 2022-01-15 DIAGNOSIS — C61 Malignant neoplasm of prostate: Secondary | ICD-10-CM

## 2022-01-15 NOTE — Progress Notes (Signed)
  Radiation Oncology         (336) [phone] ________________________________  Name: Chase Combs MRN: [id number]  Date: 11/28/2021  DOB: 1945/09/20  End of Treatment Note  Diagnosis:   76 y.o. gentleman with stage T1c adenocarcinoma of the prostate with a Gleason's score of 4+5 and a PSA of 24.6, adjusted for finasteride     Indication for treatment:  Curative, Definitive Radiotherapy       Radiation treatment dates:   10/06/21 - 12/03/21  Site/dose:  1. The prostate, seminal vesicles, and pelvic lymph nodes were initially treated to 45 Gy in 25 fractions of 1.8 Gy  2. The prostate only was boosted to 75 Gy with 15 additional fractions of 2.0 Gy   Beams/energy:  1. The prostate, seminal vesicles, and pelvic lymph nodes were initially treated using VMAT intensity modulated radiotherapy delivering 6 megavolt photons. Image guidance was performed with CB-CT studies prior to each fraction. He was immobilized with a body fix lower extremity mold.  2. the prostate only was boosted using VMAT intensity modulated radiotherapy delivering 6 megavolt photons. Image guidance was performed with CB-CT studies prior to each fraction. He was immobilized with a body fix lower extremity mold.  Narrative: The patient tolerated radiation treatment relatively well with some minor urinary irritation and modest fatigue.  He did experience increased nocturia x5 as well as dysuria but these symptoms were improved with Flomax.  He continued taking Proscar throughout treatment as well.  He specifically denied any gross hematuria, abdominal pain or bowel issues.  Plan: The patient has completed radiation treatment. He will return to radiation oncology clinic for routine followup in one month. I advised him to call or return sooner if he has any questions or concerns related to his recovery or treatment. ________________________________  Sheral Apley. Tammi Klippel, M.D.

## 2022-01-15 NOTE — Progress Notes (Signed)
Radiation Oncology         [phone] ________________________________  Name: Chase Combs MRN: [id number]  Date: 01/15/2022  DOB: Dec 13, 1945  Post Treatment Note  CC: Scifres, Earlie Server, PA-C (Inactive)  Raynelle Bring, MD  Diagnosis:   76 y.o. gentleman with stage T1c adenocarcinoma of the prostate with a Gleason's score of 4+5 and a PSA of 24.6, adjusted for finasteride  Interval Since Last Radiation:  7 weeks  10/06/21 - 12/03/21: (Concurrent with LT-ADT) 1. The prostate, seminal vesicles, and pelvic lymph nodes were initially treated to 45 Gy in 25 fractions of 1.8 Gy  2. The prostate only was boosted to 75 Gy with 15 additional fractions of 2.0 Gy   Narrative:  I spoke with the patient to conduct his routine scheduled 1 month follow up visit via telephone to spare the patient unnecessary potential exposure in the healthcare setting during the current COVID-19 pandemic.  The patient was notified in advance and gave permission to proceed with this visit format.  He tolerated radiation treatment relatively well with some minor urinary irritation and modest fatigue.  He did experience increased nocturia x5 as well as dysuria but these symptoms were improved with Flomax.  He continued taking Proscar throughout treatment as well.  He specifically denied any gross hematuria, abdominal pain or bowel issues.                              On review of systems, the patient states that he is doing very well in general.  He feels like his LUTS are pretty much back to his baseline at this point although he does continue with nocturia every hour and 1/2 to 2 hours.  He specifically denies dysuria, gross hematuria, straining to void, incomplete bladder emptying or incontinence.  He has recently resumed taking the Flomax daily as he does notice improvement in his LUTS when taking.  He reports a healthy appetite and is maintaining his weight.  He denies abdominal pain, nausea, vomiting, diarrhea or  constipation.  He continues to tolerate the ADT fairly well despite some decreased stamina and occasional hot flashes.  He had a follow-up visit with Jiles Crocker, NP on 01/08/2022 and his PSA was noted to have decreased to 0.26, indicating an excellent response to treatment.  He was able to discontinue the daily Proscar and did get another 43-monthEligard injection at that time.  Overall, he is quite pleased with his progress to date.  ALLERGIES:  is allergic to codeine.  Meds: Current Outpatient Medications  Medication Sig Dispense Refill   amLODipine (NORVASC) 10 MG tablet TAKE 1 TABLET BY MOUTH EVERY DAY (Patient taking differently: Take 10 mg by mouth daily.) 90 tablet 3   APPLE CIDER VINEGAR PO Take 1 capsule by mouth daily.     aspirin EC 81 MG tablet Take 1 tablet (81 mg total) by mouth daily. 90 tablet 3   finasteride (PROSCAR) 5 MG tablet Take 1 tablet by mouth at bedtime. (Patient not taking: Reported on 01/14/2022)     hydrochlorothiazide (HYDRODIURIL) 25 MG tablet TAKE 1 TABLET(25 MG) BY MOUTH DAILY 90 tablet 3   metoprolol succinate (TOPROL-XL) 25 MG 24 hr tablet TAKE 1/2 TABLET BY MOUTH EVERY DAY (Patient taking differently: 12.5 mg daily.) 90 tablet 3   Multiple Vitamin (MULTIVITAMIN) tablet Take 1 tablet by mouth daily.     rosuvastatin (CRESTOR) 20 MG tablet TAKE 1 TABLET(20 MG) BY MOUTH  DAILY 90 tablet 1   tamsulosin (FLOMAX) 0.4 MG CAPS capsule Take 1 capsule (0.4 mg total) by mouth daily after supper. 30 capsule 5   valsartan (DIOVAN) 320 MG tablet TAKE 1 TABLET BY MOUTH EVERY DAY (Patient taking differently: Take 320 mg by mouth at bedtime. TAKE 1 TABLET BY MOUTH EVERY DAY) 90 tablet 3   No current facility-administered medications for this encounter.    Physical Findings:  vitals were not taken for this visit.  Pain Assessment Pain Score: 0-No pain/10 Unable to assess due to telephone follow-up visit format.  Lab Findings: Lab Results  Component Value Date   WBC 6.2  03/01/2020   HGB 14.6 09/19/2021   HCT 43.0 09/19/2021   MCV 93 03/01/2020   PLT 282 03/01/2020     Radiographic Findings: No results found.  Impression/Plan: 1. 76 y.o. gentleman with stage T1c adenocarcinoma of the prostate with a Gleason's score of 4+5 and a PSA of 24.6, adjusted for finasteride. He will continue to follow up with urology for ongoing PSA determinations and has an appointment scheduled for repeat labs on 08/07/2022 and will see Dr. Alinda Money the following week, when he will be due for his next ADT injection. He understands what to expect with regards to PSA monitoring going forward. I will look forward to following his response to treatment via correspondence with urology, and would be happy to continue to participate in his care if clinically indicated. I talked to the patient about what to expect in the future, including his risk for erectile dysfunction and rectal bleeding. I encouraged him to call or return to the office if he has any questions regarding his previous radiation or possible radiation side effects. He was comfortable with this plan and will follow up as needed.     Nicholos Johns, PA-C

## 2022-01-16 DIAGNOSIS — H2511 Age-related nuclear cataract, right eye: Secondary | ICD-10-CM | POA: Diagnosis not present

## 2022-01-16 DIAGNOSIS — H2512 Age-related nuclear cataract, left eye: Secondary | ICD-10-CM | POA: Diagnosis not present

## 2022-01-29 ENCOUNTER — Other Ambulatory Visit: Payer: Self-pay | Admitting: Cardiology

## 2022-02-26 ENCOUNTER — Encounter: Payer: Self-pay | Admitting: *Deleted

## 2022-03-02 ENCOUNTER — Encounter: Payer: Self-pay | Admitting: *Deleted

## 2022-03-05 ENCOUNTER — Other Ambulatory Visit: Payer: Self-pay | Admitting: *Deleted

## 2022-03-05 ENCOUNTER — Encounter: Payer: Self-pay | Admitting: *Deleted

## 2022-03-16 ENCOUNTER — Encounter: Payer: Self-pay | Admitting: *Deleted

## 2022-03-19 ENCOUNTER — Inpatient Hospital Stay: Payer: Medicare Other | Attending: Adult Health | Admitting: *Deleted

## 2022-03-19 DIAGNOSIS — C61 Malignant neoplasm of prostate: Secondary | ICD-10-CM

## 2022-03-19 NOTE — Progress Notes (Signed)
2 Identifiers used for verification purposes. No vital signs were taken as this was a telephone visit. Pt rates pain to both knees a 5/10 and to the right shoulder. Pt does still have urinary urgency and frequency. He wears Depends but was doing this before any treatment. He does have some bowel leakage as well. Pt says he gets up to bathroom about every 1-1/2 hours at night. He says it's a little better since he's been on Flomax. We discussed  diet and exercise, although he doesn't get much exercise because of his achy knees. 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 Pt says he has felt more fatigued than usual because of the hours he works and of course the treatments. He is able to get sleep on and off in between getting up to bathroom. He sleeps in a recliner because of his right shoulder pain. Pt has never had a colonoscopy but has had 2 Cologuard tests. Pt has not visited new PCP as of yet. His former doctor left the practice. Overall he feels like he is doing good. He was excited about decreased PSA in August. Pt does not take any vaccines, but Tdap is up-to date. SCP reviewed and completed.

## 2022-03-20 ENCOUNTER — Telehealth: Payer: Medicare Other | Admitting: *Deleted

## 2022-04-30 ENCOUNTER — Other Ambulatory Visit: Payer: Self-pay | Admitting: Cardiology

## 2022-06-09 NOTE — Progress Notes (Signed)
Primary Care Provider: Scifres, Durel Salts (Inactive) [place] Cardiologist: Glenetta Hew, MD Electrophysiologist: None  Clinic Note: Chief Complaint  Patient presents with   Follow-up    Annual.  Doing relatively well.  Stable.   ===================================  ASSESSMENT/PLAN   Problem List Items Addressed This Visit       Cardiology Problems   S/p ST elevation myocardial infarction (STEMI) of inferior wall (September 2010) (Chronic)    Over 13 years out from his most recent MI.  Myoview did show some moderate scar but no evidence of ischemia.  He has had frequent PACs and PVCs for a long time but not able to tolerate higher dose of beta-blocker.  Since they are relatively asymptomatic.  No need to change.  No active heart failure or angina symptoms.      Relevant Orders   EKG 12-Lead (Completed)   CAD S/P percutaneous coronary angioplasty - Primary (Chronic)    Doing well.  No recurrent anginal chest pain or pressure with rest or exertion.  He just has exertional dyspnea which has been stable for years.  He is however limited as far as lumbar activity is able to do, they will be difficult to assess any progression of disease based on his limited exertion.  Plan: Remains on aspirin monotherapy along with stable dose of beta-blocker-unable to titrate beyond 12.5 mg of Toprol. He is on stable dose of Crestor along with ARB. If he were to require knee surgery, would probably need to have a stress test done since he has not been evaluated in quite some time.  He would prefer to try to do the treadmill stress imaging test as opposed to chemical.  He did not enjoy, but stress test last time.      Relevant Orders   EKG 12-Lead (Completed)   Essential hypertension (Chronic)    Stable BP on low-dose beta-blocker and high-dose Diovan plus HCTZ.      Dyslipidemia, goal LDL below 70 (Chronic)    Due for lipid check.  Most recently stable in January 2023.   Labs ordered today.  Continue current dose of rosuvastatin 20 mg.        Other   Episode of dizziness    Intermittent episodes of dizziness that seem to be probably related to dehydration.  Recommend that if he does have these episodes he should increase hydration.  Could also potentially hold HCTZ dose.      Hyperglycemia, unspecified (Chronic)    Had an A1c of 6.9 back in October 2021.  Suspect that he will have his lipids and A1c checked by PCP.      Fatigue due to treatment (Chronic)    Doing better with reduced dose of beta-blocker.      ===================================  HPI:    Chase Combs is a 77 y.o. male with a PMH notable for CAD (BMS PCI RI for Progressive Anging, Inf STEMI -BMS PCI RCA in 01/2009), hypertension, and hyperlipidemia who presents today for annual f/u.   CAD:  2006 - Unstable Angina - BMS PCI RCA (Co-Star 2.5 x 16). (Dr. Melvern Banker) Inf STEMI 01/2009 occluded RCA was treated with thrombectomy and 3.5 mm x 20 mm vision BMS stent placed and postdilated to 4 mm) (Dr. Angelena Form) Last Myoview -. October 2013 that showed no evidence of ischemia but evidence of an inferolateral & inferobasilar wall infarct. His last DOT physical n March 2016 was negative GXT. 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  Chase Combs was last seen on June 06, 2021 for annual follow-up.  Doing well.  Limited by knee pain.  Not as active as he used to be.  Still runs the [employer]. Noted getting tired more easily than usual, but did note improvement after reducing beta-blocker.  Dyspnea with longer walks. No CP, PND/Orthopnea or edema.   Recent Hospitalizations: n/a Prostate Gold Seed Implant - 09/19/21  Prostate Ca - hormone shots.  Does have fatigue.  Started after XRT.   Reviewed  CV studies:    The following studies were reviewed today: (if available, images/films reviewed: From Epic Chart or Care Everywhere) none:  Interval History:    Chase Combs returns today for annual follow-up doing fairly well.  Pretty much still limited by his knee pain.  Both knees and ankles as well as right shoulder bother him but he is not yet ready for surgery. He says he is short of breath all the time, but is worse with exertion.  This is pretty much his baseline dyspnea.  Denies any chest pain or pressure with rest or exertion.  He says he gets tired and short of breath has to stop at about 100 feet more because of knee pain.  Is able to stop from rest that his knees rest and catch his breath and that he can go back to walk.  Despite the knee pain and exertional dyspnea, says that he is quite active and does not bother him.  He just keeps ongoing after taking with rest. No PND orthopnea But does have some mild end of day swelling that goes down when he puts his feet up.  Although he knows he has irregular heartbeats he does not actually feel them.  Not associate with lightheadedness dizziness. Time he feels dizzy as if he bends down and leans up and is only on occasion.  CV Review of Symptoms (Summary): Cardiovascular ROS: positive for - dyspnea on exertion, edema, irregular heartbeat, shortness of breath, and positional dizziness.  DOE more related to deconditioning & knee pain; edema is end of day & mild negative for - chest pain, orthopnea, paroxysmal nocturnal dyspnea, rapid heart rate, or syncope/near syncope, TIA/amaurosis fugax  REVIEWED OF SYSTEMS   Review of Systems  Constitutional:  Positive for malaise/fatigue. Negative for weight loss.  HENT:  Positive for congestion. Negative for nosebleeds.   Respiratory:  Positive for cough (daily - mild), sputum production (mild) and shortness of breath (at baseline - no change).   Gastrointestinal:  Negative for blood in stool, constipation, diarrhea and melena.  Genitourinary:  Positive for frequency (Frequent nocturia-difficult to sleep - every 2-3 hours). Negative for dysuria and  hematuria.  Musculoskeletal:  Positive for joint pain (bilateral knees, R shoulder -- not yet ready to consider surgery). Negative for falls and myalgias.  Neurological:  Positive for dizziness (bending over). Negative for focal weakness, weakness and headaches.  Psychiatric/Behavioral:  Negative for memory loss. The patient is not nervous/anxious and does not have insomnia (just doesn't sleep well 2/2 nocturia).   All other systems reviewed and are negative.  I have reviewed and (if needed) personally updated the patient's problem list, medications, allergies, past medical and surgical history, social and family history.   PAST MEDICAL HISTORY   Past Medical History:  Diagnosis Date   Abdominal hernia    per pt   Arthritis    hands   Benign localized prostatic hyperplasia with lower urinary tract symptoms (LUTS)  CAD (coronary artery disease) 05/2004   cardiologist--- dr Ellyn Hack;  positive stress test , had outpt cardiac cath 05-27-2004 stenosis RI;  06-18-2004 cath w/ PCI and BMS x1 to pRI;   STEMI 02-16-2009  s/p cath w/ PCI thrombectomy for total occluded RCA , BMS x1 to pRCA;  nuclear stress test 03-15-2012 low risk no ischemia but sig inferior-inferolateral infarct , ef 51%   Dyslipidemia    Essential hypertension    History of COVID-19 05/2020   per pt mild symptoms that resolved   History of ST elevation myocardial infarction (STEMI) 02/16/2009   inferior STEMI   cath w/ pci w/ BMS to pRCA   History of urinary retention    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[facility]) 04/2021   urologist--- dr borden/ radiation oncology-- dr Tammi Klippel;  dx 12/ 2022,  Gleason 4+5, PSA 24.6, volume 94.6cc;  plan to start IMRT   OSA (obstructive sleep apnea)    per pt dx approx 2008, no cpap intolerant   Pre-diabetes    S/p bare metal coronary artery stent    01/ 2006  x1 BMS to Center (CoStar study stent);  and 09/ 2010  x1 BMS to pRCA    PAST SURGICAL HISTORY   Past Surgical History:  Procedure  Laterality Date   CARDIAC CATHETERIZATION  05/27/2004   outpatient by dr Claiborne [facility] cardiology) for positive stress test;  80% stensis pRI   CORONARY ANGIOPLASTY WITH STENT PLACEMENT  06/18/2004   '@[facility]'$  by Dr Melvern Banker;  PCI w/ BMS to pRI  (CoStar study stent)   CORONARY ANGIOPLASTY WITH STENT PLACEMENT  02/16/2009   '@[facility]'$  by dr Angelena Form;   Inferior STEMI:  PCI w/ thrombectomy total occluded RCA, BMS x1 to pRCA , residual 40-50% pLAD, ef 45-50%   GOLD SEED IMPLANT N/A 09/19/2021   Procedure: GOLD SEED IMPLANT;  Surgeon: Ardis Hughs, MD;  Location: [facility];  Service: Urology;  Laterality: N/A;  ONLY NEEDS 30 MIN FOR ALL   INGUINAL HERNIA REPAIR Right 09/02/1999   '@[facility]'$ ;   recurrent repair right inguinal hernia;   previous repair approx 1990s   INGUINAL HERNIA REPAIR Left    1990s   KNEE ARTHROSCOPY W/ MENISCAL REPAIR Right 12/18/2005   '@[facility]'$    LUMBAR [place]   L5--S1   SPACE OAR INSTILLATION N/A 09/19/2021   Procedure: SPACE OAR INSTILLATION;  Surgeon: Ardis Hughs, MD;  Location: [facility];  Service: Urology;  Laterality: N/A;    Immunization History  Administered Date(s) Administered   Tdap 10/08/2014    MEDICATIONS/ALLERGIES   Current Meds  Medication Sig   amLODipine (NORVASC) 10 MG tablet TAKE 1 TABLET BY MOUTH EVERY DAY (Patient taking differently: Take 10 mg by mouth daily.)   APPLE CIDER VINEGAR PO Take 1 capsule by mouth once a week.   aspirin EC 81 MG tablet Take 1 tablet (81 mg total) by mouth daily.   hydrochlorothiazide (HYDRODIURIL) 25 MG tablet TAKE 1 TABLET(25 MG) BY MOUTH DAILY   metoprolol succinate (TOPROL-XL) 25 MG 24 hr tablet TAKE 1/2 TABLET BY MOUTH EVERY DAY   Multiple Vitamin (MULTIVITAMIN) tablet Take 1 tablet by mouth daily.   rosuvastatin (CRESTOR) 20 MG tablet TAKE 1 TABLET(20 MG) BY MOUTH DAILY   tamsulosin (FLOMAX) 0.4 MG CAPS capsule Take 1 capsule (0.4 mg total) by mouth daily after supper.    valsartan (DIOVAN) 320 MG tablet TAKE 1 TABLET BY MOUTH EVERY DAY   vitamin B-12 (CYANOCOBALAMIN)  50 MCG tablet Take 50 mcg by mouth daily.    Allergies  Allergen Reactions   Codeine Nausea And Vomiting    SOCIAL HISTORY/FAMILY HISTORY   Reviewed in Epic:  Pertinent findings:  Social History   Tobacco Use   Smoking status: Never   Smokeless tobacco: Never  Vaping Use   Vaping Use: Never used  Substance Use Topics   Alcohol use: No    Alcohol/week: 0.0 standard drinks of alcohol   Drug use: Never   Social History   Social History Narrative   He is married, father of 31, grandfather of 36. Never smoked. Does not drink.    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    He does lots of lifting, pulling with ropes, using upper extremities.       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    OBJCTIVE -PE, EKG, labs   Wt Readings from Last 3 Encounters:  06/10/22 216 lb 9.6 oz (98.2 kg)  09/19/21 203 lb 1.6 oz (92.1 kg)  06/17/21 208 lb 12.8 oz (94.7 kg)    Physical Exam: BP 132/60   Pulse 74   Ht 6' (1.829 m)   Wt 216 lb 9.6 oz (98.2 kg)   SpO2 95%   BMI 29.38 kg/m  Physical Exam Vitals reviewed.  Constitutional:      General: He is not in acute distress.    Appearance: Normal appearance. He is normal weight. He is not ill-appearing or toxic-appearing.     Comments: Well-nourished, well-groomed.  Healthy-appearing.  HENT:     Head: Normocephalic and atraumatic.  Neck:     Vascular: No carotid bruit or JVD.  Cardiovascular:     Rate and Rhythm: Normal rate and regular rhythm. Occasional Extrasystoles are present.    Chest Wall: PMI is not displaced.     Pulses: Normal pulses.     Heart sounds: Normal heart sounds, S1 normal and S2 normal. No murmur heard.    No  friction rub. No gallop.  Pulmonary:     Effort: Pulmonary effort is normal. No respiratory distress.     Breath sounds: Normal breath sounds. No wheezing, rhonchi or rales.  Chest:     Chest wall: No tenderness.  Musculoskeletal:        General: No swelling. Normal range of motion.     Cervical back: Normal range of motion and neck supple.  Skin:    General: Skin is warm and dry.     Coloration: Skin is not jaundiced or pale.  Neurological:     General: No focal deficit present.     Mental Status: He is alert. Mental status is at baseline.  Psychiatric:        Behavior: Behavior normal.        Thought Content: Thought content normal.        Judgment: Judgment normal.     Adult ECG Report  Rate: 74;  Rhythm: normal sinus rhythm, premature atrial contractions (PAC), premature ventricular contractions (PVC), and LAFB. ;   Narrative Interpretation: Essentially stable  Recent Labs:  due for labs  Lab Results  Component Value Date   CHOL 122 06/06/2021   HDL 40 06/06/2021   LDLCALC 66 06/06/2021   TRIG 81  06/06/2021   CHOLHDL 3.1 06/06/2021   Lab Results  Component Value Date   CREATININE 0.70 09/19/2021   BUN 12 09/19/2021   NA 140 09/19/2021   K 3.8 09/19/2021   CL 99 09/19/2021   CO2 25 06/06/2021      Latest Ref Rng & Units 09/19/2021    8:01 AM 03/01/2020    9:31 AM 12/03/2017    8:17 AM  CBC  WBC 3.4 - 10.8 x10E3/uL  6.2  7.1   Hemoglobin 13.0 - 17.0 g/dL 14.6  15.3  15.1   Hematocrit 39.0 - 52.0 % 43.0  45.5  43.9   Platelets 150 - 450 x10E3/uL  282  337     Lab Results  Component Value Date   HGBA1C 6.9 (H) 03/01/2020   Lab Results  Component Value Date   TSH 2.060 03/01/2020    ================================================== I spent a total of 25 minutes with the patient spent in direct patient consultation.  Additional time spent with chart review  / charting (studies, outside notes, etc): 16 min Total Time: 41 min  Current medicines are  reviewed at length with the patient today.  (+/- concerns) N/A  Notice: This dictation was prepared with Dragon dictation along with smart phrase technology. Any transcriptional errors that result from this process are unintentional and may not be corrected upon review.  Studies Ordered:   Orders Placed This Encounter  Procedures   EKG 12-Lead   No orders of the defined types were placed in this encounter.   Patient Instructions / Medication Changes & Studies & Tests Ordered   Patient Instructions  Medication Instructions:  No changes  *If you need a refill on your cardiac medications before your next appointment, please call your pharmacy*  Other Instructions    If you become dizzy - you are probably dehydrated -  -drink  water for hydration  Lab Work:  Not needed   Testing/Procedures: Not needed   Follow-Up: At [facility], you and your health needs are our priority.  As part of our continuing mission to provide you with exceptional heart care, we have created designated Provider Care Teams.  These Care Teams include your primary Cardiologist (physician) and Advanced Practice Providers (APPs -  Physician Assistants and Nurse Practitioners) who all work together to provide you with the care you need, when you need it.     Your next appointment:   12 month(s)  The format for your next appointment:   In Person  Provider:   Glenetta Hew, MD    Other Instructions    If you become dizzy - you are probably dehydrated -  -drink  water for hydration     Leonie Man, MD, MS Glenetta Hew, M.D., M.S. Interventional Cardiologist  [facility]  Pager # [phone] Phone # [phone] [address] [place], [place] [address]   Thank you for choosing [facility]!!

## 2022-06-10 ENCOUNTER — Ambulatory Visit: Payer: Medicare Other | Attending: Cardiology | Admitting: Cardiology

## 2022-06-10 ENCOUNTER — Encounter: Payer: Self-pay | Admitting: Cardiology

## 2022-06-10 VITALS — BP 132/60 | HR 74 | Ht 72.0 in | Wt 216.6 lb

## 2022-06-10 DIAGNOSIS — I2119 ST elevation (STEMI) myocardial infarction involving other coronary artery of inferior wall: Secondary | ICD-10-CM | POA: Diagnosis not present

## 2022-06-10 DIAGNOSIS — R5383 Other fatigue: Secondary | ICD-10-CM

## 2022-06-10 DIAGNOSIS — I10 Essential (primary) hypertension: Secondary | ICD-10-CM

## 2022-06-10 DIAGNOSIS — I2510 Atherosclerotic heart disease of native coronary artery without angina pectoris: Secondary | ICD-10-CM

## 2022-06-10 DIAGNOSIS — Z9861 Coronary angioplasty status: Secondary | ICD-10-CM

## 2022-06-10 DIAGNOSIS — R42 Dizziness and giddiness: Secondary | ICD-10-CM | POA: Diagnosis not present

## 2022-06-10 DIAGNOSIS — R739 Hyperglycemia, unspecified: Secondary | ICD-10-CM | POA: Diagnosis not present

## 2022-06-10 DIAGNOSIS — E785 Hyperlipidemia, unspecified: Secondary | ICD-10-CM | POA: Diagnosis not present

## 2022-06-10 NOTE — Patient Instructions (Signed)
Medication Instructions:  No changes  *If you need a refill on your cardiac medications before your next appointment, please call your pharmacy*  Other Instructions    If you become dizzy - you are probably dehydrated -  -drink  water for hydration  Lab Work:  Not needed   Testing/Procedures: Not needed   Follow-Up: At [facility], you and your health needs are our priority.  As part of our continuing mission to provide you with exceptional heart care, we have created designated Provider Care Teams.  These Care Teams include your primary Cardiologist (physician) and Advanced Practice Providers (APPs -  Physician Assistants and Nurse Practitioners) who all work together to provide you with the care you need, when you need it.     Your next appointment:   12 month(s)  The format for your next appointment:   In Person  Provider:   Glenetta Hew, MD    Other Instructions    If you become dizzy - you are probably dehydrated -  -drink  water for hydration

## 2022-06-13 ENCOUNTER — Encounter: Payer: Self-pay | Admitting: Cardiology

## 2022-06-13 DIAGNOSIS — R42 Dizziness and giddiness: Secondary | ICD-10-CM | POA: Insufficient documentation

## 2022-06-13 NOTE — Assessment & Plan Note (Signed)
Due for lipid check.  Most recently stable in January 2023.  Labs ordered today.  Continue current dose of rosuvastatin 20 mg.

## 2022-06-13 NOTE — Assessment & Plan Note (Signed)
Doing better with reduced dose of beta-blocker.

## 2022-06-13 NOTE — Assessment & Plan Note (Signed)
Over 13 years out from his most recent MI.  Myoview did show some moderate scar but no evidence of ischemia.  He has had frequent PACs and PVCs for a long time but not able to tolerate higher dose of beta-blocker.  Since they are relatively asymptomatic.  No need to change.  No active heart failure or angina symptoms.

## 2022-06-13 NOTE — Assessment & Plan Note (Signed)
Doing well.  No recurrent anginal chest pain or pressure with rest or exertion.  He just has exertional dyspnea which has been stable for years.  He is however limited as far as lumbar activity is able to do, they will be difficult to assess any progression of disease based on his limited exertion.  Plan: Remains on aspirin monotherapy along with stable dose of beta-blocker-unable to titrate beyond 12.5 mg of Toprol. He is on stable dose of Crestor along with ARB. If he were to require knee surgery, would probably need to have a stress test done since he has not been evaluated in quite some time.  He would prefer to try to do the treadmill stress imaging test as opposed to chemical.  He did not enjoy, but stress test last time.

## 2022-06-13 NOTE — Assessment & Plan Note (Signed)
Had an A1c of 6.9 back in October 2021.  Suspect that he will have his lipids and A1c checked by PCP.

## 2022-06-13 NOTE — Assessment & Plan Note (Signed)
Stable BP on low-dose beta-blocker and high-dose Diovan plus HCTZ.

## 2022-06-13 NOTE — Assessment & Plan Note (Signed)
Intermittent episodes of dizziness that seem to be probably related to dehydration.  Recommend that if he does have these episodes he should increase hydration.  Could also potentially hold HCTZ dose.

## 2022-06-29 DIAGNOSIS — R310 Gross hematuria: Secondary | ICD-10-CM | POA: Diagnosis not present

## 2022-07-09 DIAGNOSIS — M170 Bilateral primary osteoarthritis of knee: Secondary | ICD-10-CM | POA: Diagnosis not present

## 2022-07-15 ENCOUNTER — Other Ambulatory Visit: Payer: Self-pay | Admitting: Cardiology

## 2022-07-16 DIAGNOSIS — M170 Bilateral primary osteoarthritis of knee: Secondary | ICD-10-CM | POA: Diagnosis not present

## 2022-07-17 DIAGNOSIS — N3289 Other specified disorders of bladder: Secondary | ICD-10-CM | POA: Diagnosis not present

## 2022-07-17 DIAGNOSIS — R310 Gross hematuria: Secondary | ICD-10-CM | POA: Diagnosis not present

## 2022-07-17 DIAGNOSIS — N200 Calculus of kidney: Secondary | ICD-10-CM | POA: Diagnosis not present

## 2022-07-17 DIAGNOSIS — N281 Cyst of kidney, acquired: Secondary | ICD-10-CM | POA: Diagnosis not present

## 2022-07-22 DIAGNOSIS — R310 Gross hematuria: Secondary | ICD-10-CM | POA: Diagnosis not present

## 2022-07-23 DIAGNOSIS — M170 Bilateral primary osteoarthritis of knee: Secondary | ICD-10-CM | POA: Diagnosis not present

## 2022-07-27 ENCOUNTER — Other Ambulatory Visit: Payer: Self-pay | Admitting: Cardiology

## 2022-07-27 ENCOUNTER — Other Ambulatory Visit: Payer: Self-pay | Admitting: Urology

## 2022-07-27 DIAGNOSIS — D49512 Neoplasm of unspecified behavior of left kidney: Secondary | ICD-10-CM

## 2022-08-20 ENCOUNTER — Ambulatory Visit
Admission: RE | Admit: 2022-08-20 | Discharge: 2022-08-20 | Disposition: A | Discharge status: Home / Self Care | Payer: Medicare Other | Source: Ambulatory Visit | Attending: Urology | Admitting: Urology

## 2022-08-20 DIAGNOSIS — D49512 Neoplasm of unspecified behavior of left kidney: Secondary | ICD-10-CM | POA: Diagnosis not present

## 2022-08-20 MED ORDER — GADOPICLENOL 0.5 MMOL/ML IV SOLN
10.0000 mL | Freq: Once | INTRAVENOUS | Status: AC | PRN
  Administered 2022-08-20: 10 mL via INTRAVENOUS

## 2022-09-02 DIAGNOSIS — D49512 Neoplasm of unspecified behavior of left kidney: Secondary | ICD-10-CM | POA: Diagnosis not present

## 2022-09-17 DIAGNOSIS — H43393 Other vitreous opacities, bilateral: Secondary | ICD-10-CM | POA: Diagnosis not present

## 2022-09-17 DIAGNOSIS — H35033 Hypertensive retinopathy, bilateral: Secondary | ICD-10-CM | POA: Diagnosis not present

## 2022-09-17 DIAGNOSIS — H2513 Age-related nuclear cataract, bilateral: Secondary | ICD-10-CM | POA: Diagnosis not present

## 2022-09-17 DIAGNOSIS — H11153 Pinguecula, bilateral: Secondary | ICD-10-CM | POA: Diagnosis not present

## 2022-10-22 DIAGNOSIS — I10 Essential (primary) hypertension: Secondary | ICD-10-CM | POA: Diagnosis not present

## 2022-10-22 DIAGNOSIS — E119 Type 2 diabetes mellitus without complications: Secondary | ICD-10-CM | POA: Diagnosis not present

## 2022-10-22 DIAGNOSIS — Z9861 Coronary angioplasty status: Secondary | ICD-10-CM | POA: Diagnosis not present

## 2022-10-22 DIAGNOSIS — I25119 Atherosclerotic heart disease of native coronary artery with unspecified angina pectoris: Secondary | ICD-10-CM | POA: Diagnosis not present

## 2022-10-22 DIAGNOSIS — D649 Anemia, unspecified: Secondary | ICD-10-CM | POA: Diagnosis not present

## 2022-10-22 DIAGNOSIS — Z0000 Encounter for general adult medical examination without abnormal findings: Secondary | ICD-10-CM | POA: Diagnosis not present

## 2022-10-22 DIAGNOSIS — Z1211 Encounter for screening for malignant neoplasm of colon: Secondary | ICD-10-CM | POA: Diagnosis not present

## 2022-10-22 DIAGNOSIS — I2510 Atherosclerotic heart disease of native coronary artery without angina pectoris: Secondary | ICD-10-CM | POA: Diagnosis not present

## 2022-11-20 ENCOUNTER — Other Ambulatory Visit: Payer: Self-pay | Admitting: Cardiology

## 2023-01-14 ENCOUNTER — Other Ambulatory Visit: Payer: Self-pay | Admitting: Cardiology

## 2023-03-18 DIAGNOSIS — H2513 Age-related nuclear cataract, bilateral: Secondary | ICD-10-CM | POA: Diagnosis not present

## 2023-03-18 DIAGNOSIS — H43393 Other vitreous opacities, bilateral: Secondary | ICD-10-CM | POA: Diagnosis not present

## 2023-03-18 DIAGNOSIS — H35033 Hypertensive retinopathy, bilateral: Secondary | ICD-10-CM | POA: Diagnosis not present

## 2023-03-18 DIAGNOSIS — H11153 Pinguecula, bilateral: Secondary | ICD-10-CM | POA: Diagnosis not present

## 2023-04-05 DIAGNOSIS — H2512 Age-related nuclear cataract, left eye: Secondary | ICD-10-CM | POA: Diagnosis not present

## 2023-05-03 ENCOUNTER — Other Ambulatory Visit: Payer: Self-pay | Admitting: Cardiology

## 2023-05-04 ENCOUNTER — Other Ambulatory Visit: Payer: Self-pay | Admitting: Cardiology

## 2023-05-07 DIAGNOSIS — I25119 Atherosclerotic heart disease of native coronary artery with unspecified angina pectoris: Secondary | ICD-10-CM | POA: Diagnosis not present

## 2023-05-07 DIAGNOSIS — R0602 Shortness of breath: Secondary | ICD-10-CM | POA: Diagnosis not present

## 2023-05-07 DIAGNOSIS — R600 Localized edema: Secondary | ICD-10-CM | POA: Diagnosis not present

## 2023-05-07 DIAGNOSIS — E7800 Pure hypercholesterolemia, unspecified: Secondary | ICD-10-CM | POA: Diagnosis not present

## 2023-05-07 DIAGNOSIS — E119 Type 2 diabetes mellitus without complications: Secondary | ICD-10-CM | POA: Diagnosis not present

## 2023-05-07 DIAGNOSIS — I10 Essential (primary) hypertension: Secondary | ICD-10-CM | POA: Diagnosis not present

## 2023-05-11 DIAGNOSIS — H2181 Floppy iris syndrome: Secondary | ICD-10-CM | POA: Diagnosis not present

## 2023-05-11 DIAGNOSIS — H268 Other specified cataract: Secondary | ICD-10-CM | POA: Diagnosis not present

## 2023-05-11 DIAGNOSIS — H5703 Miosis: Secondary | ICD-10-CM | POA: Diagnosis not present

## 2023-05-11 DIAGNOSIS — H2512 Age-related nuclear cataract, left eye: Secondary | ICD-10-CM | POA: Diagnosis not present

## 2023-06-08 DIAGNOSIS — M1712 Unilateral primary osteoarthritis, left knee: Secondary | ICD-10-CM | POA: Diagnosis not present

## 2023-06-08 DIAGNOSIS — M25561 Pain in right knee: Secondary | ICD-10-CM | POA: Diagnosis not present

## 2023-06-09 ENCOUNTER — Telehealth: Payer: Self-pay | Admitting: Cardiology

## 2023-06-09 NOTE — Telephone Encounter (Signed)
   Name: Chase Combs  DOB: Jul 01, 1945  MRN: [id number]  Primary Cardiologist: Randene Bustard, MD  Chart reviewed as part of pre-operative protocol coverage. The patient has an upcoming visit scheduled with Dr. Addie Holstein on 1/27/2025at which time clearance can be addressed in case there are any issues that would impact surgical recommendations.  Left total knee arthoplasty Is not scheduled until TBDas below. I added preop FYI to appointment note so that provider is aware to address at time of outpatient visit.  Per office protocol the cardiology provider should forward their finalized clearance decision and recommendations regarding antiplatelet therapy to the requesting party below.    This message will also be routed to pharmacy pool and/or Dr Addie Holstein for input on holding ASA as requested below so that this information is available to the clearing provider at time of patient's appointment.   I will route this message as FYI to requesting party and remove this message from the preop box as separate preop APP input not needed at this time.   Please call with any questions.  Friddie Jetty, NP  06/09/2023, 4:49 PM

## 2023-06-09 NOTE — Telephone Encounter (Signed)
   Pre-operative Risk Assessment    Patient Name: Chase Combs  DOB: 03-17-1946 MRN: [id number]   Date of last office visit: 06/10/2022 Date of next office visit: 06/21/2023   Request for Surgical Clearance    Procedure:   left total knee arthroplasty  Date of Surgery:  Clearance TBD                                Surgeon:  Dr. Marionette Sick Surgeon's Group or Practice Name:  [facility] Phone number:  [phone] Fax number:  [phone]   Type of Clearance Requested:   - Pharmacy:  Hold Aspirin  how many days prior to surgery   Type of Anesthesia:   choice   Additional requests/questions:    [name]   06/09/2023, 10:41 AM

## 2023-06-11 DIAGNOSIS — E119 Type 2 diabetes mellitus without complications: Secondary | ICD-10-CM | POA: Diagnosis not present

## 2023-06-17 ENCOUNTER — Emergency Department (HOSPITAL_BASED_OUTPATIENT_CLINIC_OR_DEPARTMENT_OTHER)
Admission: EM | Admit: 2023-06-17 | Discharge: 2023-06-17 | Disposition: A | Discharge status: Home / Self Care | Payer: Medicare Other | Attending: Emergency Medicine | Admitting: Emergency Medicine

## 2023-06-17 ENCOUNTER — Other Ambulatory Visit: Payer: Self-pay

## 2023-06-17 ENCOUNTER — Emergency Department (HOSPITAL_BASED_OUTPATIENT_CLINIC_OR_DEPARTMENT_OTHER): Payer: Medicare Other | Admitting: Radiology

## 2023-06-17 ENCOUNTER — Encounter (HOSPITAL_BASED_OUTPATIENT_CLINIC_OR_DEPARTMENT_OTHER): Payer: Self-pay | Admitting: Emergency Medicine

## 2023-06-17 DIAGNOSIS — J101 Influenza due to other identified influenza virus with other respiratory manifestations: Secondary | ICD-10-CM | POA: Insufficient documentation

## 2023-06-17 DIAGNOSIS — Z7982 Long term (current) use of aspirin: Secondary | ICD-10-CM | POA: Diagnosis not present

## 2023-06-17 DIAGNOSIS — J111 Influenza due to unidentified influenza virus with other respiratory manifestations: Secondary | ICD-10-CM

## 2023-06-17 DIAGNOSIS — R9389 Abnormal findings on diagnostic imaging of other specified body structures: Secondary | ICD-10-CM | POA: Diagnosis not present

## 2023-06-17 DIAGNOSIS — Z8616 Personal history of COVID-19: Secondary | ICD-10-CM | POA: Insufficient documentation

## 2023-06-17 DIAGNOSIS — M791 Myalgia, unspecified site: Secondary | ICD-10-CM | POA: Diagnosis not present

## 2023-06-17 DIAGNOSIS — R051 Acute cough: Secondary | ICD-10-CM | POA: Diagnosis not present

## 2023-06-17 DIAGNOSIS — R059 Cough, unspecified: Secondary | ICD-10-CM | POA: Diagnosis not present

## 2023-06-17 DIAGNOSIS — I2510 Atherosclerotic heart disease of native coronary artery without angina pectoris: Secondary | ICD-10-CM | POA: Diagnosis not present

## 2023-06-17 DIAGNOSIS — R7989 Other specified abnormal findings of blood chemistry: Secondary | ICD-10-CM | POA: Diagnosis not present

## 2023-06-17 DIAGNOSIS — I10 Essential (primary) hypertension: Secondary | ICD-10-CM | POA: Insufficient documentation

## 2023-06-17 DIAGNOSIS — Z79899 Other long term (current) drug therapy: Secondary | ICD-10-CM | POA: Diagnosis not present

## 2023-06-17 DIAGNOSIS — R0981 Nasal congestion: Secondary | ICD-10-CM | POA: Diagnosis not present

## 2023-06-17 DIAGNOSIS — R778 Other specified abnormalities of plasma proteins: Secondary | ICD-10-CM | POA: Diagnosis not present

## 2023-06-17 DIAGNOSIS — I700 Atherosclerosis of aorta: Secondary | ICD-10-CM | POA: Diagnosis not present

## 2023-06-17 DIAGNOSIS — R509 Fever, unspecified: Secondary | ICD-10-CM | POA: Diagnosis not present

## 2023-06-17 LAB — BASIC METABOLIC PANEL
Anion gap: 11 (ref 5–15)
BUN: 18 mg/dL (ref 8–23)
CO2: 30 mmol/L (ref 22–32)
Calcium: 9.1 mg/dL (ref 8.9–10.3)
Chloride: 98 mmol/L (ref 98–111)
Creatinine, Ser: 0.92 mg/dL (ref 0.61–1.24)
GFR, Estimated: >60 mL/min (ref >60)
Glucose, Bld: 127 mg/dL — ABNORMAL HIGH (ref 70–99)
Potassium: 3.7 mmol/L (ref 3.5–5.1)
Sodium: 139 mmol/L (ref 135–145)

## 2023-06-17 LAB — CBC
HCT: 35.8 % — ABNORMAL LOW (ref 39.0–52.0)
Hemoglobin: 12.1 g/dL — ABNORMAL LOW (ref 13.0–17.0)
MCH: 30.9 pg (ref 26.0–34.0)
MCHC: 33.8 g/dL (ref 30.0–36.0)
MCV: 91.6 fL (ref 80.0–100.0)
Platelets: 221 10*3/uL (ref 150–400)
RBC: 3.91 MIL/uL — ABNORMAL LOW (ref 4.22–5.81)
RDW: 13 % (ref 11.5–15.5)
WBC: 8.5 10*3/uL (ref 4.0–10.5)
nRBC: 0 % (ref 0.0–0.2)

## 2023-06-17 LAB — RESP PANEL BY RT-PCR (RSV, FLU A&B, COVID)  RVPGX2
Influenza A by PCR: POSITIVE — AB
Influenza B by PCR: NEGATIVE
Resp Syncytial Virus by PCR: NEGATIVE
SARS Coronavirus 2 by RT PCR: NEGATIVE

## 2023-06-17 LAB — TROPONIN I (HIGH SENSITIVITY)
Troponin I (High Sensitivity): 43 ng/L — ABNORMAL HIGH (ref <18)
Troponin I (High Sensitivity): 59 ng/L — ABNORMAL HIGH (ref <18)

## 2023-06-17 MED ORDER — ACETAMINOPHEN 325 MG PO TABS
650.0000 mg | ORAL_TABLET | Freq: Once | ORAL | Status: AC
  Administered 2023-06-17: 650 mg via ORAL
  Filled 2023-06-17: qty 2

## 2023-06-17 MED ORDER — OSELTAMIVIR PHOSPHATE 75 MG PO CAPS: 75.0000 mg | ORAL_CAPSULE | Freq: Two times a day (BID) | ORAL | 0 refills | Status: DC

## 2023-06-17 NOTE — ED Provider Notes (Signed)
Chase Combs EMERGENCY DEPARTMENT AT [facility] Provider Note   CSN: [id number] Arrival date & time: 06/17/23  1742     History  Chief Complaint  Patient presents with   Cough    Chase Combs is a 78 y.o. male.   Cough  Patient shortness of breath and cough.  Began yesterday.  Had some mild sputum production.  No definite fevers.  Seen at urgent care and sent here.  Reportedly negative flu and COVID test there on the drive not have access to it.  No chest pain.  Had room air sats of 91%.  Patient does not smoke.  Denies history of COPD or lung issues.   Past Medical History:  Diagnosis Date   Abdominal hernia    per pt   Arthritis    hands   Benign localized prostatic hyperplasia with lower urinary tract symptoms (LUTS)    CAD (coronary artery disease) 05/2004   cardiologist--- dr Herbie [place];  positive stress test , had outpt cardiac cath 05-27-2004 stenosis RI;  06-18-2004 cath w/ PCI and BMS x1 to pRI;   STEMI 02-16-2009  s/p cath w/ PCI thrombectomy for total occluded RCA , BMS x1 to pRCA;  nuclear stress test 03-15-2012 low risk no ischemia but sig inferior-inferolateral infarct , ef 51%   Dyslipidemia    Essential hypertension    History of COVID-19 05/2020   per pt mild symptoms that resolved   History of ST elevation myocardial infarction (STEMI) 02/16/2009   inferior STEMI   cath w/ pci w/ BMS to pRCA   History of urinary retention    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[facility]) 04/2021   urologist--- dr borden/ radiation oncology-- dr Kathrynn Running;  dx 12/ 2022,  Gleason 4+5, PSA 24.6, volume 94.6cc;  plan to start IMRT   OSA (obstructive sleep apnea)    per pt dx approx 2008, no cpap intolerant   Pre-diabetes    S/p bare metal coronary artery stent    01/ 2006  x1 BMS to pRI (CoStar study stent);  and 09/ 2010  x1 BMS to pRCA    Home Medications Prior to Admission medications   Medication Sig Start Date End Date Taking? Authorizing Provider  oseltamivir (TAMIFLU)  75 MG capsule Take 1 capsule (75 mg total) by mouth every 12 (twelve) hours. 06/17/23  Yes Benjiman Core, MD  amLODipine (NORVASC) 10 MG tablet TAKE 1 TABLET BY MOUTH EVERY DAY 05/04/23   Marykay Lex, MD  APPLE CIDER VINEGAR PO Take 1 capsule by mouth once a week.    [provider]  aspirin EC 81 MG tablet Take 1 tablet (81 mg total) by mouth daily. 09/24/17   Marykay Lex, MD  hydrochlorothiazide (HYDRODIURIL) 25 MG tablet TAKE 1 TABLET(25 MG) BY MOUTH DAILY 11/20/22   Marykay Lex, MD  metoprolol succinate (TOPROL-XL) 25 MG 24 hr tablet Take 0.5 tablets (12.5 mg total) by mouth daily. Please keep scheduled appointment for future refills. Thank you. 05/03/23   Marykay Lex, MD  Multiple Vitamin (MULTIVITAMIN) tablet Take 1 tablet by mouth daily.    [provider]  rosuvastatin (CRESTOR) 20 MG tablet Take 1 tablet (20 mg total) by mouth daily. KEEP OV. 05/04/23   Marykay Lex, MD  tamsulosin (FLOMAX) 0.4 MG CAPS capsule Take 1 capsule (0.4 mg total) by mouth daily after supper. 11/17/21   Margaretmary Dys, MD  valsartan (DIOVAN) 320 MG tablet TAKE 1 TABLET BY MOUTH EVERY DAY 01/14/23  Marykay Lex, MD  vitamin B-12 (CYANOCOBALAMIN) 50 MCG tablet Take 50 mcg by mouth daily.    [provider]      Allergies    Codeine    Review of Systems   Review of Systems  Respiratory:  Positive for cough.     Physical Exam Updated Vital Signs BP (!) 116/57   Pulse 89   Temp 100.3 F (37.9 C) (Oral)   Resp (!) 28   SpO2 93%  Physical Exam Vitals and nursing note reviewed.  HENT:     Head: Normocephalic.  Cardiovascular:     Rate and Rhythm: Regular rhythm.  Pulmonary:     Breath sounds: No wheezing or rales.     Comments: Occasional cough. Abdominal:     Tenderness: There is no abdominal tenderness.  Musculoskeletal:     Comments: Trace edema bilateral lower extremities.  Neurological:     Mental Status: He is alert and oriented to  person, place, and time.     ED Results / Procedures / Treatments   Labs (all labs ordered are listed, but only abnormal results are displayed) Labs Reviewed  RESP PANEL BY RT-PCR (RSV, FLU A&B, COVID)  RVPGX2 - Abnormal; Notable for the following components:      Result Value   Influenza A by PCR POSITIVE (*)    All other components within normal limits  BASIC METABOLIC PANEL - Abnormal; Notable for the following components:   Glucose, Bld 127 (*)    All other components within normal limits  CBC - Abnormal; Notable for the following components:   RBC 3.91 (*)    Hemoglobin 12.1 (*)    HCT 35.8 (*)    All other components within normal limits  TROPONIN I (HIGH SENSITIVITY) - Abnormal; Notable for the following components:   Troponin I (High Sensitivity) 43 (*)    All other components within normal limits  TROPONIN I (HIGH SENSITIVITY) - Abnormal; Notable for the following components:   Troponin I (High Sensitivity) 59 (*)    All other components within normal limits    EKG None  Radiology DG Chest 2 View Result Date: 06/17/2023 CLINICAL DATA:  Cough and fever EXAM: CHEST - 2 VIEW COMPARISON:  02/17/2009 FINDINGS: Stable cardiomediastinal silhouette. Aortic atherosclerotic calcification. Left basilar atelectasis. Scarring in the right mid lung. No pleural effusion or pneumothorax. Elevated right hemidiaphragm. IMPRESSION: No active cardiopulmonary disease. Electronically Signed   By: Minerva Fester M.D.   On: 06/17/2023 18:48    Procedures Procedures    Medications Ordered in ED Medications  acetaminophen (TYLENOL) tablet 650 mg (650 mg Oral Given 06/17/23 2052)    ED Course/ Medical Decision Making/ A&P                                 Medical Decision Making Amount and/or Complexity of Data Reviewed Labs: ordered.  Risk OTC drugs. Prescription drug management.   Patient with cough.  Some shortness of breath.  States he has bad knees and is slow to ambulate  normally.  Does not feel much worse than his baseline.  Differential diagnose includes viral syndrome, pneumonia, less likely CHF.  X-ray reassuring.  No pneumonia seen.  Will get basic blood work.  EKG is some nausea changes laterally.  States he does have a dull ache in the chest.  States he thinks it is more from the coughing however.  Initial troponin  slightly elevated.  Will recheck as likely secondary to his respiratory status.  Will check pulse ox with ambulating.  Patient is influenza positive.  Second troponin is slightly increased from prior but still relatively close.  States he is having no chest pain no anginal symptoms.  Was ambulated and sats went down to 90.  I think likely the influenza and pulmonary issues are the reasons of the troponin being elevated.  Appears stable for discharge home.  Has short-term follow-up with cardiology on Monday with today being Thursday.  Will return for worsening symptoms.        Final Clinical Impression(s) / ED Diagnoses Final diagnoses:  Influenza  Elevated troponin    Rx / DC Orders ED Discharge Orders          Ordered    oseltamivir (TAMIFLU) 75 MG capsule  Every 12 hours        06/17/23 2125              Benjiman Core, MD 06/17/23 2127

## 2023-06-17 NOTE — Discharge Instructions (Addendum)
Follow-up with cardiology short-term as planned.  Return for shortness of breath that worsens or chest pain.  Your heart enzymes were little elevated but I think is likely secondary to your influenza.

## 2023-06-17 NOTE — ED Triage Notes (Signed)
Cough since yesterday. Sent by UC sent for concern for PNA- COVID/Flu negative.  HX cardiac stents.

## 2023-06-17 NOTE — Progress Notes (Signed)
Patient ambulated around the department while on room air.  His vitals before the walk were HR-103, SpO2-94%, RR=22.  His vitals at the end of the walk were HR-111, SpO2-90%, and RR=28.

## 2023-06-21 ENCOUNTER — Ambulatory Visit: Payer: Medicare Other | Attending: Cardiology | Admitting: Cardiology

## 2023-06-21 ENCOUNTER — Encounter: Payer: Self-pay | Admitting: Cardiology

## 2023-06-21 VITALS — BP 128/70 | HR 87 | Ht 72.0 in | Wt 216.0 lb

## 2023-06-21 DIAGNOSIS — I2119 ST elevation (STEMI) myocardial infarction involving other coronary artery of inferior wall: Secondary | ICD-10-CM | POA: Diagnosis not present

## 2023-06-21 DIAGNOSIS — I10 Essential (primary) hypertension: Secondary | ICD-10-CM

## 2023-06-21 DIAGNOSIS — I2510 Atherosclerotic heart disease of native coronary artery without angina pectoris: Secondary | ICD-10-CM

## 2023-06-21 DIAGNOSIS — Z01810 Encounter for preprocedural cardiovascular examination: Secondary | ICD-10-CM | POA: Diagnosis not present

## 2023-06-21 DIAGNOSIS — E1169 Type 2 diabetes mellitus with other specified complication: Secondary | ICD-10-CM | POA: Diagnosis not present

## 2023-06-21 DIAGNOSIS — R0609 Other forms of dyspnea: Secondary | ICD-10-CM | POA: Diagnosis not present

## 2023-06-21 DIAGNOSIS — I25118 Atherosclerotic heart disease of native coronary artery with other forms of angina pectoris: Secondary | ICD-10-CM

## 2023-06-21 DIAGNOSIS — I2089 Other forms of angina pectoris: Secondary | ICD-10-CM

## 2023-06-21 DIAGNOSIS — Z9861 Coronary angioplasty status: Secondary | ICD-10-CM

## 2023-06-21 DIAGNOSIS — E785 Hyperlipidemia, unspecified: Secondary | ICD-10-CM

## 2023-06-21 MED ORDER — ROSUVASTATIN CALCIUM 20 MG PO TABS: 20.0000 mg | ORAL_TABLET | Freq: Every day | ORAL | 3 refills | Status: DC

## 2023-06-21 MED ORDER — METOPROLOL SUCCINATE ER 25 MG PO TB24: 12.5000 mg | ORAL_TABLET | Freq: Every day | ORAL | 3 refills | Status: DC

## 2023-06-21 MED ORDER — FUROSEMIDE 20 MG PO TABS: 20.0000 mg | ORAL_TABLET | Freq: Every day | ORAL | 3 refills | Status: DC

## 2023-06-21 NOTE — Progress Notes (Addendum)
 Cardiology Office Note:  .   Date:  07/27/2023  ID:  Chase Combs, DOB Oct 26, 1945, MRN [id number] PCP: Scifres, Peter Minium (Inactive)  [facility] Providers Cardiologist:  Bryan Lemma, MD     Orthopedist: Dr. Ranell Patrick  Chief Complaint  Patient presents with   Follow-up    Annual   Coronary Artery Disease    DOE - no real Chest pain - just bloating discomfort    Patient Profile: .     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 provider found.  CAD:  2006 - Unstable Angina - BMS PCI RCA (Co-Star 2.5 x 16). (Dr. Elsie Lincoln) Inf STEMI 01/2009:  occluded RCA was treated with thrombectomy and 3.5 mm x 20 mm vision BMS stent placed and postdilated to 4 mm) (Dr. Clifton James) Last Myoview -. October 2013 that showed no evidence of ischemia but evidence of an inferolateral & inferobasilar wall infarct. His last DOT physical n March 2016 was negative GXT. 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   Chase Combs was last seen on about a year ago on June 10, 2022 for routine follow-up.  He was doing well overall, but is limited by his knee pain.  Both knees and ankles as well as right shoulder bothering him at this time.  Not yet considering surgery.  He did say that he was short of breath all the time and is worse with exertion.  At this time, he mentioned that that was a pretty much pretty much his baseline but he had not noticed any chest pain or pressure at rest or exertion.  Just noting that he gets short of breath and tired after walking 100 feet.  He was not sure if it was really his knees or true dyspnea that limits him. -He noted daily cough, chronic fatigue and exertional dyspnea.  He noted urinary frequency keeping him up at night,  dizziness bending over but also bilateral knee and shoulder pain.   Subjective  Discussed the use of AI scribe software for clinical note transcription with the patient, who gave verbal consent to proceed.  History of Present Illness   The patient, a 78 year old with a history of coronary artery disease (BMS PCI in 2006 & 2010), presents with increasing shortness of breath and dizziness. The patient reports feeling short-winded, particularly when walking briskly or standing up from a bent position. This has been ongoing but has worsened since a recent bout of influenza. The patient denies chest pain or pressure but occasionally experiences what he describes as gas-like discomfort in the lower chest.  The patient also reports a history of knee problems, which have limited his mobility and may contribute to his deconditioning. He has never smoked but admits to a weight gain of 15-20 pounds since retirement. The patient sleeps in a recliner due to shoulder discomfort and feeling short of breath when lying flat. He denies waking up due to breathlessness but does report frequent nocturia.  The patient also mentions occasional dizziness, particularly when standing up from a bent position. He attributes this to his knee problems, which limit his ability to stand up quickly. The patient denies any chest tightness or pressure, only shortness of breath and a  strange "bloating" sensation in his lower chest.  The patient's dyspnea and dizziness have been ongoing but have worsened since a recent bout of influenza. Despite these symptoms, the patient reports feeling in good shape before retirement, when he was more physically active. The patient has never smoked and has been trying to lose weight. He also reports leg swelling, which sometimes reduces when he elevates his feet. He takes furosemide 4-5 times a week as needed for this symptom.  The patient has a history of heart disease, with two stents placed in 2006  and 2010. He recalls that he mostly noted extreme fatigue with some dyspnea during these 2 episodes but does not really recall true chest pain or pressure.  The patient is also being treated for hyperlipidemia and has a slightly elevated A1C. He denies ever being treated for diabetes.   On review of symptoms from his visit last year - most of these complaints are not new, but he now claims more prominent dyspnea since reitring.  Coincidentally, since retiring, he has been caring for his wife who had been sick becoming more surgery, and then he himself got sick with flu.     Cardiovascular ROS: positive for - dyspnea on exertion, edema, irregular heartbeat, orthopnea, shortness of breath, and exercise intolerance with fatigue and dyspnea.  Some bloating like gas discomfort in his lower chest that occasionally occurs with direction. negative for - palpitations, paroxysmal nocturnal dyspnea, rapid heart rate, or syncope or near syncope, CVA/TIA or amaurosis fugax  claudication  ROS:  Review of Systems - Negative except musculoskeletal pains of shoulder and back and knee pain.  Dizziness bending over.  Chronic dyspnea but now seems to be worse.  Frequent nocturia => at this point, he is taking he is retired and he may want to actually go through with the surgery, and is asking if you be okay for surgery..   Objective   Studies Reviewed: Marland Kitchen   EKG Interpretation Date/Time:  Monday June 21 2023 17:03:59 EST Ventricular Rate:  80 PR Interval:    QRS Duration:  156 QT Interval:  436 QTC Calculation: 502 R Axis:   134  Text Interpretation: Wide QRS rhythm with occasional Premature ventricular complexes Right bundle branch block Left posterior fascicular block Bifascicular block When compared with ECG of 17-Jun-2023 18:56, No significant change since last tracing Confirmed by Bryan Lemma ([id number]) on 06/21/2023 5:24:38 PM    No new studies noted.  LABS Total cholesterol: 109 mg/dL  (62/13/0865) HDL: 37 mg/dL (78/46/9629) LDL: 56 mg/dL (52/84/1324) Triglycerides: 76 mg/dL (40/02/2724) DGU4Q: 0.3% (06/11/2023)  Risk Assessment/Calculations:        Physical Exam:   VS:  BP 128/70 (BP Location: Right Arm, Patient Position: Sitting, Cuff Size: Normal)   Pulse 87   Ht 6' (1.829 m)   Wt 216 lb (98 kg)   SpO2 92%   BMI 29.29 kg/m    Wt Readings from Last 3 Encounters:  06/21/23 216 lb (98 kg)  06/10/22 216 lb 9.6 oz (98.2 kg)  09/19/21 203 lb 1.6 oz (92.1 kg)    GEN: Well nourished, well developed in no acute distress; borderline obese (but notable wgt gain) NECK: No JVD; No carotid bruits CARDIAC: RRR with ectopy., Normal S1, S2; no murmurs, rubs, gallops RESPIRATORY:  Clear to auscultation without rales, wheezing or rhonchi ; nonlabored, good air movement. ABDOMEN: Soft, non-tender, non-distended EXTREMITIES:  No edema; No deformity     ASSESSMENT AND PLAN: .    Problem  List Items Addressed This Visit       Cardiology Problems   Atypical angina (HCC)   Difficult to tell what this bloating sensation is feeling in his chest, but with the progression of his dyspnea it is quite possible atypical angina.  As such we discussed ischemic evaluation.  He has had Myoview stress test in the past that have been nonischemic, simply showing inferior infarct. -Will plan for [employer] (see informed consent) -Continue beta-blocker and calcium channel blocker for antianginal benefit -Convert from HCTZ to daily furosemide for better volume control given possibility of diastolic dysfunction      Relevant Medications   furosemide (LASIX) 20 MG tablet   metoprolol succinate (TOPROL-XL) 25 MG 24 hr tablet   rosuvastatin (CRESTOR) 20 MG tablet   Other Relevant Orders   Cardiac Stress Test: Informed Consent Details: Physician/Practitioner Attestation; Transcribe to consent form and obtain patient signature   MYOCARDIAL PERFUSION IMAGING (Completed)   ECHOCARDIOGRAM  COMPLETE (Completed)   CAD S/P percutaneous coronary angioplasty (Chronic)   He had been pretty stable since his BMS PCI in 2010 and it really has not had any chest pain or pressure with rest or exertion, but is now noting progressively worsening dyspnea on exertion and a bloating sensation in the chest.  Reasonable to consider ischemic evaluation, especially in light of his desire to now go through with knee surgery. Currently managed on low-dose Toprol 25 mg daily along with amlodipine 10 mg daily for antianginal benefit.  This is combined with valsartan 320 mg and 25 mg HCTZ for afterload reduction. Is on stable dose of rosuvastatin 20 mg daily for lipid control which continues to be adequate. He is on aspirin antiplatelet monotherapy  Continue current medications with exception of stopping HCTZ to allow for full-time dosing of furosemide daily Ischemic evaluation with Lexiscan Myoview based on progression of exertional dyspnea, atypical chest discomfort and preop assessment.      Relevant Medications   furosemide (LASIX) 20 MG tablet   metoprolol succinate (TOPROL-XL) 25 MG 24 hr tablet   rosuvastatin (CRESTOR) 20 MG tablet   Other Relevant Orders   Cardiac Stress Test: Informed Consent Details: Physician/Practitioner Attestation; Transcribe to consent form and obtain patient signature   MYOCARDIAL PERFUSION IMAGING (Completed)   ECHOCARDIOGRAM COMPLETE (Completed)   Essential hypertension (Chronic)   BP well-controlled on current doses of valsartan, amlodipine and Toprol. He is also on HCTZ but is having more edema.  As such, we will convert from HCTZ to standing dose furosemide with additional doses PRN. -Continue to recommend foot elevation and support stockings --Discontinue Hydrochlorothiazide and take Furosemide daily.      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 -Continue current management..  A1C 6.9 as of 06/07/2023. -Encourage weight loss and increased physical activity. -Anticipate that he will likely need medication initiated by PCP -- consider SGLT2-Inhibitor      Relevant Medications   furosemide (LASIX) 20 MG tablet   metoprolol succinate (TOPROL-XL) 25 MG 24 hr tablet   rosuvastatin (CRESTOR) 20 MG tablet   S/p ST elevation myocardial infarction (STEMI) of inferior wall (September 2010) (Chronic)   Inferior STEMI back in 2010 with  BMS PCI to the RCA.  Myoview showed some scar but no ischemia in the past.  EF to be preserved.  Would expect to see similar infarct on follow-up Myoview now, but we need to look for reversibility.      Relevant Medications   furosemide (LASIX) 20 MG tablet   metoprolol succinate (TOPROL-XL) 25 MG 24 hr tablet   rosuvastatin (CRESTOR) 20 MG tablet   Other Relevant Orders   Cardiac Stress Test: Informed Consent Details: Physician/Practitioner Attestation; Transcribe to consent form and obtain patient signature   MYOCARDIAL PERFUSION IMAGING (Completed)   ECHOCARDIOGRAM COMPLETE (Completed)     Other   DOE (dyspnea on exertion) - Primary (Chronic)   Seems to be progressing per his report, however reading prior notes, this may just be continued progression of his underlying dyspnea.  He is probably more sedentary and has gained weight which is contributing as well. However, given his history of CAD with BMS stents in 2006 and 2010 with prior infarct, reasonable to reassess for ischemia and change in cardiac function.  Dyspnea is Not New - but seems to have worsened after recent flu (actually since retirement).  No "chest pain or pressure" - but does note a "gas-like bloating" sensation ~Orthopnea -  sleeps in a recliner due to shoulder discomfort.  Wheezing noted on exam. -Order pharmacologic stress test to evaluate for ischemia.  [facility] Myoview) -Order echocardiogram to assess cardiac function and rule out heart failure. -Discontinue Hydrochlorothiazide and take Furosemide daily for leg swelling.      Relevant Orders   Cardiac Stress Test: Informed Consent Details: Physician/Practitioner Attestation; Transcribe to consent form and obtain patient signature   MYOCARDIAL PERFUSION IMAGING (Completed)   ECHOCARDIOGRAM COMPLETE (Completed)   Preop cardiovascular exam   Preoperative Evaluation Patient planning knee replacement surgery in April.-Dr. Devonne Doughty ([facility]) -Complete cardiac workup (stress test and echocardiogram) to assess cardiac risk prior to surgery.        Follow-Up: Return in about 2 months (around 08/19/2023) for Routine Follow-up after testing ~ 1-2 months. After completion of stress test and echocardiogram. If results indicate need for cardiac catheterization, this may delay planned knee surgery.        Informed Consent   Shared Decision Making/Informed Consent The risks [chest pain, shortness of breath, cardiac arrhythmias, dizziness, blood pressure fluctuations, myocardial infarction, stroke/transient ischemic attack, nausea, vomiting, allergic reaction, radiation exposure, metallic taste sensation and life-threatening complications (estimated to be 1 in 10,000)], benefits (risk stratification, diagnosing coronary artery disease, treatment guidance) and alternatives of a nuclear stress test were discussed in detail with Chase Combs and he agrees to proceed.        I spent 48 minutes in the care of Chase Combs today including reviewing outside labs from [facility] (2 min), reviewing studies (5 min), face to face time discussing treatment options (25 min), reviewing records from prior visits & ER  (7 min), 9 min ditating, and documenting in the  encounter.   ADDENDUM:    Chase Combs perioperative risk of a major cardiac event is 0.9% according to the Revised Cardiac Risk Index (RCRI).  His echocardiogram stress test results were reviewed-EF mildly reduced as expected on echocardiogram, Myoview nonischemic. Therefore, he is at low risk for perioperative complications.   His functional capacity is good at 7.25 METs according to the Duke Activity Status Index (DASI). Recommendations: According to ACC/AHA guidelines, no further cardiovascular testing needed.  The patient may proceed to surgery at acceptable risk.   Antiplatelet  and/or Anticoagulation Recommendations: Aspirin can be held for 5 days prior to his surgery.  Please resume Aspirin post operatively when it is felt to be safe from a bleeding standpoint.  He is not on any anticoagulation      Signed, Marykay Lex, MD, MS Bryan Lemma, M.D., M.S. Interventional Cardiologist  [facility]  Pager # [phone] Phone # [phone] [address] [place], [place] [address]

## 2023-06-21 NOTE — Assessment & Plan Note (Addendum)
Difficult to tell what this bloating sensation is feeling in his chest, but with the progression of his dyspnea it is quite possible atypical angina.  As such we discussed ischemic evaluation.  He has had Myoview stress test in the past that have been nonischemic, simply showing inferior infarct. -Will plan for Lexiscan Myoview (see informed consent) -Continue beta-blocker and calcium channel blocker for antianginal benefit -Convert from HCTZ to daily furosemide for better volume control given possibility of diastolic dysfunction

## 2023-06-21 NOTE — Assessment & Plan Note (Signed)
Seems to be progressing per his report, however reading prior notes, this may just be continued progression of his underlying dyspnea.  He is probably more sedentary and has gained weight which is contributing as well. However, given his history of CAD with BMS stents in 2006 and 2010 with prior infarct, reasonable to reassess for ischemia and change in cardiac function.  Dyspnea is Not New - but seems to have worsened after recent flu (actually since retirement).  No "chest pain or pressure" - but does note a "gas-like bloating" sensation ~Orthopnea - sleeps in a recliner due to shoulder discomfort.  Wheezing noted on exam. -Order pharmacologic stress test to evaluate for ischemia.  [facility] Myoview) -Order echocardiogram to assess cardiac function and rule out heart failure. -Discontinue Hydrochlorothiazide and take Furosemide daily for leg swelling.

## 2023-06-21 NOTE — Assessment & Plan Note (Signed)
Preoperative Evaluation Patient planning knee replacement surgery in April.-Dr. Devonne Doughty ([facility]) -Complete cardiac workup (stress test and echocardiogram) to assess cardiac risk prior to surgery.

## 2023-06-21 NOTE — Assessment & Plan Note (Signed)
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  Myoview showed some scar but no ischemia in the past.  EF to be preserved.  Would expect to see similar infarct on follow-up Myoview now, but we need to look for reversibility.

## 2023-06-21 NOTE — Patient Instructions (Addendum)
Medication Instructions:    Stop taking Hydrochlorothiazide   Take furosemide  20 mg daily   *If you need a refill on your cardiac medications before your next appointment, please call your pharmacy*   Lab Work: Not needed    Testing/Procedures:   Will be schedule  at [address] Your physician has requested that you have an echocardiogram. Echocardiography is a painless test that uses sound waves to create images of your heart. It provides your doctor with information about the size and shape of your heart and how well your heart's chambers and valves are working. This procedure takes approximately one hour. There are no restrictions for this procedure. Please do NOT wear cologne, perfume, aftershave, or lotions (deodorant is allowed). Please arrive 15 minutes prior to your appointment time.  Please note: We ask at that you not bring children with you during ultrasound (echo/ vascular) testing. Due to room size and safety concerns, children are not allowed in the ultrasound rooms during exams. Our front office staff cannot provide observation of children in our lobby area while testing is being conducted. An adult accompanying a patient to their appointment will only be allowed in the ultrasound room at the discretion of the ultrasound technician under special circumstances. We apologize for any inconvenience.       Will be schedule  at [address] Your doctor has scheduled you for a Myocardial Perfusion scan on to obtain information about the blood flow to your heart. The test consists of taking pictures of your heart in two phases: while resting and after a stress test.  The stress test part , you will be given a drug intended to have a similar effect on the heart to that of exercise.  The test will take approximately 3 to 4  hours to complete.   How to prepare for your test: Do not eat or drink 2 hours prior to your test Do not consume  products containing caffeine 12 hours prior to your test (examples: coffee (regular OR decaf), chocolate, sodas, tea) Your doctor may need you to hold certain medications prior to the test.  If so, these are listed below and should not be taken for 24 hours prior to the test.  If not listed below, you may take your medications as normal.  You may resume taking held medications on your normal schedule once the test is complete.   Meds to hold:  Metoprolol Succinate 12.5 mg  Do bring a list of your current medications with you.  If you have held any meds in preparation for the test, please bring them, as you may be required to take them once the test is completed. Do wear comfortable clothes and walking shoes.  Do not wear dresses or overalls. Do NOT wear cologne, perfume, aftershave, or fragranced lotions the day of your test (deodorants okay). If these instructions are not followed your test will have to be rescheduled.   A nuclear cardiologist will review your test, prepare a report and send it to your physician.   If you have questions or concerns about your appointment, you can call the Nuclear Cardiology department at [phone] x [phone]. If you cannot keep your appointment, please provide 48 hours notification to avoid a possible $50.00 charge to your account.   Please arrive 15 minutes prior to your appointment time for registration and insurance purposes  Follow-Up: At [facility], you and your health needs are our priority.  As part of our continuing mission to provide you with exceptional heart care, we have created designated Provider Care Teams.  These Care Teams include your primary Cardiologist (physician) and Advanced Practice Providers (APPs -  Physician Assistants and Nurse Practitioners) who all work together to provide you with the care you need, when you need it.     Your next appointment:   2 month(s)  The format for your next appointment:   In Person  Provider:   Bryan Lemma, MD    Other Instructions

## 2023-06-21 NOTE — Assessment & Plan Note (Signed)
Hyperlipidemia Total cholesterol 109, HDL 37, LDL 56, Triglycerides 76 as of 05/06/2033. -Continue current management..  A1C 6.9 as of 06/07/2023. -Encourage weight loss and increased physical activity. -Anticipate that he will likely need medication initiated by PCP -- consider SGLT2-Inhibitor

## 2023-06-21 NOTE — Assessment & Plan Note (Addendum)
He had been pretty stable since his BMS PCI in 2010 and it really has not had any chest pain or pressure with rest or exertion, but is now noting progressively worsening dyspnea on exertion and a bloating sensation in the chest.  Reasonable to consider ischemic evaluation, especially in light of his desire to now go through with knee surgery. Currently managed on low-dose Toprol 25 mg daily along with amlodipine 10 mg daily for antianginal benefit.  This is combined with valsartan 320 mg and 25 mg HCTZ for afterload reduction. Is on stable dose of rosuvastatin 20 mg daily for lipid control which continues to be adequate. He is on aspirin antiplatelet monotherapy  Continue current medications with exception of stopping HCTZ to allow for full-time dosing of furosemide daily Ischemic evaluation with Lexiscan Myoview based on progression of exertional dyspnea, atypical chest discomfort and preop assessment.

## 2023-06-21 NOTE — Assessment & Plan Note (Signed)
BP well-controlled on current doses of valsartan, amlodipine and Toprol. He is also on HCTZ but is having more edema.  As such, we will convert from HCTZ to standing dose furosemide with additional doses PRN. -Continue to recommend foot elevation and support stockings --Discontinue Hydrochlorothiazide and take Furosemide daily.

## 2023-07-05 ENCOUNTER — Telehealth (HOSPITAL_COMMUNITY): Payer: Self-pay

## 2023-07-05 NOTE — Telephone Encounter (Signed)
 Spoke with the patient, detailed instructions given. He stated that he would be here for his test. Asked to call back with any questions. S.Melrose Kearse CCT

## 2023-07-12 ENCOUNTER — Ambulatory Visit (HOSPITAL_COMMUNITY): Payer: Medicare Other | Attending: Cardiology

## 2023-07-12 ENCOUNTER — Ambulatory Visit (HOSPITAL_BASED_OUTPATIENT_CLINIC_OR_DEPARTMENT_OTHER): Payer: Medicare Other

## 2023-07-12 ENCOUNTER — Encounter: Payer: Self-pay | Admitting: Cardiology

## 2023-07-12 DIAGNOSIS — I2119 ST elevation (STEMI) myocardial infarction involving other coronary artery of inferior wall: Secondary | ICD-10-CM | POA: Insufficient documentation

## 2023-07-12 DIAGNOSIS — I25118 Atherosclerotic heart disease of native coronary artery with other forms of angina pectoris: Secondary | ICD-10-CM | POA: Insufficient documentation

## 2023-07-12 DIAGNOSIS — I2510 Atherosclerotic heart disease of native coronary artery without angina pectoris: Secondary | ICD-10-CM | POA: Insufficient documentation

## 2023-07-12 DIAGNOSIS — I2089 Other forms of angina pectoris: Secondary | ICD-10-CM

## 2023-07-12 DIAGNOSIS — Z9861 Coronary angioplasty status: Secondary | ICD-10-CM | POA: Diagnosis not present

## 2023-07-12 DIAGNOSIS — R0609 Other forms of dyspnea: Secondary | ICD-10-CM | POA: Insufficient documentation

## 2023-07-12 LAB — MYOCARDIAL PERFUSION IMAGING
LV dias vol: 213 mL (ref 62–150)
LV sys vol: 147 mL
Nuc Stress EF: 31 %
Peak HR: 96 {beats}/min
Rest HR: 70 {beats}/min
Rest Nuclear Isotope Dose: 10.8 mCi
SDS: 2
SRS: 2
SSS: 4
ST Depression (mm): 0 mm
Stress Nuclear Isotope Dose: 32.7 mCi
TID: 1.1

## 2023-07-12 LAB — ECHOCARDIOGRAM COMPLETE
Area-P 1/2: 5.84 cm2
P 1/2 time: 484 ms
S' Lateral: 5.6 cm

## 2023-07-12 MED ORDER — TECHNETIUM TC 99M TETROFOSMIN IV KIT
10.8000 | PACK | Freq: Once | INTRAVENOUS | Status: AC | PRN
  Administered 2023-07-12: 10.8 via INTRAVENOUS

## 2023-07-12 MED ORDER — TECHNETIUM TC 99M TETROFOSMIN IV KIT
32.7000 | PACK | Freq: Once | INTRAVENOUS | Status: AC | PRN
  Administered 2023-07-12: 32.7 via INTRAVENOUS

## 2023-07-12 MED ORDER — REGADENOSON 0.4 MG/5ML IV SOLN
0.4000 mg | Freq: Once | INTRAVENOUS | Status: AC
  Administered 2023-07-12: 0.4 mg via INTRAVENOUS

## 2023-07-15 ENCOUNTER — Encounter: Payer: Self-pay | Admitting: Cardiology

## 2023-07-16 ENCOUNTER — Other Ambulatory Visit: Payer: Self-pay | Admitting: Urology

## 2023-07-16 DIAGNOSIS — D49512 Neoplasm of unspecified behavior of left kidney: Secondary | ICD-10-CM

## 2023-07-26 ENCOUNTER — Telehealth: Payer: Self-pay | Admitting: Cardiology

## 2023-07-26 NOTE — Telephone Encounter (Signed)
 Patient calling for his stress test results.

## 2023-07-26 NOTE — Telephone Encounter (Signed)
 Spoke to patient recent lexiscan results given.Stated he wanted to know if Dr.Hardening cleared him to have knee replacements.He will call surgeons office and have them fax a clearance.I will send message to Dr.Harding.

## 2023-07-26 NOTE — Telephone Encounter (Signed)
 Patient called stating he was already seen in January for his preop and only needed to have stress test. He doesn't understand why he needs to come in again in April for another clearance visit.

## 2023-07-27 ENCOUNTER — Telehealth: Payer: Self-pay | Admitting: *Deleted

## 2023-07-27 NOTE — Telephone Encounter (Signed)
 Open encounter in error.Chase Combs

## 2023-07-27 NOTE — Telephone Encounter (Signed)
 Called spoke to  patient .  Patient states he is not having any  unusual symptoms.  He states he actually is feeling a little better because he was just  getting over something in Jan 2025. He still   Patient states he would like to get the knee surgery over as soon as possible.    Patient states Dr Ranell Patrick office will not schedule knee surgery -until they have  clearance from cardiology . Patient is aware  will send  message to Dr Herbie Baltimore

## 2023-07-27 NOTE — Telephone Encounter (Signed)
 The reason for scheduling a visit was to be to go over his echocardiogram and stress test result, as well as reviewing his symptoms.  If he is doing better and feeling okay without significant exertional dyspnea or any more of the chest fullness, I think it is fine for him to proceed with his surgery.  His stress test was nonischemic and the echocardiogram showed relatively stable ejection fraction from back in 2005.  We would not need to do any additional testing for him to proceed.   Bryan Lemma, MD

## 2023-07-27 NOTE — Telephone Encounter (Signed)
 Patient is aware, RN  routed cardiac clearance to Dr Ranell Patrick . He is cleared to knee surgery.

## 2023-07-27 NOTE — Telephone Encounter (Signed)
 I addended his clinic note with a cardiac risk assessment.  Okay to proceed with surgery.  I think the surgeons office usually sends the prior authorization which can be seen by our APP's based on this note.

## 2023-07-27 NOTE — Telephone Encounter (Signed)
   Preop Risk Assessment:  Myoview Stress Test 07/12/2023: Read as "high risk due to reduced EF, that does not correlate with echo therefore not high risk.  No evidence of ischemia.  There is evidence of prior infarct previously documented. Echocardiogram 07/12/2023: EF 40 to 45% with moderately reduced EF.  (Relatively stable from 2005)  Chase Combs perioperative risk of a major cardiac event is 0.9% according to the Revised Cardiac Risk Index (RCRI).  His echocardiogram stress test results were reviewed-EF mildly reduced as expected on echocardiogram, Myoview nonischemic. Therefore, he is at low risk for perioperative complications.   His functional capacity is good at 7.25 METs according to the Duke Activity Status Index (DASI). Recommendations: According to ACC/AHA guidelines, no further cardiovascular testing needed.  The patient may proceed to surgery at acceptable risk.   Antiplatelet and/or Anticoagulation Recommendations: Aspirin can be held for 5 days prior to his surgery.  Please resume Aspirin post operatively when it is felt to be safe from a bleeding standpoint.  He is not on any anticoagulation    Bryan Lemma, MD

## 2023-07-27 NOTE — Telephone Encounter (Signed)
 Called spoke to  patient .  Patient states he is not having any  unusual symptoms.  He states he actually is feeling a little better because he was just  getting over something in Jan 2025. He still   Patient states he would like to get the knee surgery over as soon as possible.     Patient states Dr Ranell Patrick office will not schedule knee surgery -until they have  clearance from cardiology . Patient is aware  will send  message to Dr Herbie Baltimore        RN  made another encounter for  the above message/

## 2023-08-23 ENCOUNTER — Ambulatory Visit
Admission: RE | Admit: 2023-08-23 | Discharge: 2023-08-23 | Disposition: A | Discharge status: Home / Self Care | Payer: Medicare Other | Source: Ambulatory Visit | Attending: Urology | Admitting: Urology

## 2023-08-23 ENCOUNTER — Encounter (HOSPITAL_BASED_OUTPATIENT_CLINIC_OR_DEPARTMENT_OTHER): Payer: Self-pay

## 2023-08-23 DIAGNOSIS — N281 Cyst of kidney, acquired: Secondary | ICD-10-CM | POA: Diagnosis not present

## 2023-08-23 DIAGNOSIS — D3501 Benign neoplasm of right adrenal gland: Secondary | ICD-10-CM | POA: Diagnosis not present

## 2023-08-23 DIAGNOSIS — D49512 Neoplasm of unspecified behavior of left kidney: Secondary | ICD-10-CM

## 2023-08-23 MED ORDER — GADOPICLENOL 0.5 MMOL/ML IV SOLN
10.0000 mL | Freq: Once | INTRAVENOUS | Status: AC | PRN
  Administered 2023-08-23: 10 mL via INTRAVENOUS

## 2023-08-24 ENCOUNTER — Encounter: Payer: Self-pay | Admitting: Cardiology

## 2023-08-24 ENCOUNTER — Ambulatory Visit: Payer: Medicare Other | Attending: Cardiology | Admitting: Cardiology

## 2023-08-24 VITALS — BP 136/74 | HR 92 | Ht 69.0 in | Wt 216.0 lb

## 2023-08-24 DIAGNOSIS — I10 Essential (primary) hypertension: Secondary | ICD-10-CM | POA: Diagnosis not present

## 2023-08-24 DIAGNOSIS — Z9861 Coronary angioplasty status: Secondary | ICD-10-CM

## 2023-08-24 DIAGNOSIS — R0609 Other forms of dyspnea: Secondary | ICD-10-CM | POA: Diagnosis not present

## 2023-08-24 DIAGNOSIS — I2510 Atherosclerotic heart disease of native coronary artery without angina pectoris: Secondary | ICD-10-CM

## 2023-08-24 DIAGNOSIS — E785 Hyperlipidemia, unspecified: Secondary | ICD-10-CM | POA: Diagnosis not present

## 2023-08-24 DIAGNOSIS — I2119 ST elevation (STEMI) myocardial infarction involving other coronary artery of inferior wall: Secondary | ICD-10-CM | POA: Diagnosis not present

## 2023-08-24 DIAGNOSIS — E1169 Type 2 diabetes mellitus with other specified complication: Secondary | ICD-10-CM | POA: Diagnosis not present

## 2023-08-24 DIAGNOSIS — Z01810 Encounter for preprocedural cardiovascular examination: Secondary | ICD-10-CM

## 2023-08-24 NOTE — Progress Notes (Signed)
 Cardiology Office Note:  .   Date:  08/28/2023  ID:  TAREN TOOPS, DOB [birth year], MRN [id number] PCP: Scifres, Peter Minium (Inactive)  [facility] Providers Cardiologist:  Bryan Lemma, MD     Chief Complaint  Patient presents with   Follow-up    86-month follow-up to discuss results of the studies   Coronary Artery Disease    No active angina    Patient Profile: .     OLIE DIBERT is a 78 y.o. male with a PMH reviewed below who presents here for 59-month follow-up at the request of No ref. provider found.  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 PMH notable for CAD, HTN and HLD CAD:  2006 - Unstable Angina - BMS PCI RCA (Co-Star 2.5 x 16). (Dr. Elsie Lincoln) Inf STEMI 01/2009:  occluded RCA was treated with thrombectomy and 3.5 mm x 20 mm vision BMS stent placed and postdilated to 4 mm) (Dr. Clifton James) Last Myoview -. October 2013 that showed no evidence of ischemia but evidence of an inferolateral & inferobasilar wall infarct. His last DOT physical n March 2016 was negative GXT. 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    DMONI FORTSON was last seen on June 21, 2023 for routine follow-up, noted some exertional dyspnea and chest discomfort.  Plan was evaluated with Myoview and Echo.  Part of preop evaluation.  Subjective  Discussed the use of AI scribe software for clinical note transcription with the patient, who gave verbal consent to proceed.  History of Present Illness History of Present Illness Espn Zeman Paynter is a 78 year old male with coronary artery disease and prior myocardial infarction who presents for preoperative cardiac evaluation for knee surgery.  He has a history of coronary artery disease and a prior myocardial infarction in 2010. A recent stress test indicated an area of prior heart attack with scar tissue and a low ejection fraction of 31%. An echocardiogram performed on the same day showed a better ejection  fraction of 40-45%, with global hypokinesis and some dilation in the left atrium. His heart condition has been stable since 2013, with no significant changes noted.  He experiences shortness of breath, which he attributes to decreased physical activity due to knee pain. He used to walk 5-10 miles a day for work but has reduced his activity significantly. He feels out of shape and notes that his knees have limited his ability to walk. He experiences shortness of breath when walking but not when lying flat or waking up at night.  He mentions occasional chest discomfort, which he describes as a gas-like pain lasting 20-30 minutes, occurring infrequently, and not associated with exertion.. No active chest pain, squeezing, or heart rhythm issues. He reports extra heartbeats, which he has experienced for years, and no swelling in his legs except for some swelling around the knee, which he attributes to his knee condition.  His current medications include aspirin, metoprolol, atorvastatin, Diovan, and hydrochlorothiazide. He notes that his blood pressure was slightly elevated today, and his A1c levels fluctuate daily. He denies smoking and uses CBD cream for knee pain relief.  He finally retired from truck driving.  He states that he is notably reduced physical activity due to knee pain.  Cardiovascular ROS: positive for - chest pain, dyspnea on exertion, and chest pain is more gas-like pain and exertional dyspnea is stable mostly related to deconditioning and weight gain due to lack of  activity. negative for - edema, irregular heartbeat, orthopnea, palpitations, paroxysmal nocturnal dyspnea, rapid heart rate, shortness of breath, or syncope or near syncope or TIA/CVA/amaurosis fugax/or claudication      Objective    Studies Reviewed: Marland Kitchen        ECHO 07/12/2023: EF 40 to 45%.  Moderate severely dilated LV with global HK.  Only GR 1 DD.  Mild to moderate LA dilation.  Normal RV size and function.  Normal  RVP and RAP.  Aortic root measured 40 mm. Myoview 07/12/2023: Read As High Risk due to presence of fixed Inferior Perfusion Defect with Abnormal Wall Motion Consistent with Prior Infarction-Inferior Scar.  EF estimated at 30-44% (although echo more accurate).  Fixed Inferior Defect with NO ISCHEMIA.  Consistent with known anatomy and prior inferior infarct. => Inferior scar seen on previous Myoview in 2013.   Lipids from PCP via [facility]: (05/07/2023) TC 109, TG 76, HDL 37, LDL 56.  Hgb 12.1.;  (06/11/2023) A1c 6.9 Lab Results  Component Value Date   NA 139 06/17/2023   K 3.7 06/17/2023   CREATININE 0.92 06/17/2023   GFRNONAA >60 06/17/2023   GLUCOSE 127 (H) 06/17/2023    Risk Assessment/Calculations:         Physical Exam:   VS:  BP 136/74   Pulse 92   Ht 5\' 9"  (1.753 m)   Wt 216 lb (98 kg)   SpO2 93%   BMI 31.90 kg/m    Wt Readings from Last 3 Encounters:  08/24/23 216 lb (98 kg)  06/21/23 216 lb (98 kg)  06/10/22 216 lb 9.6 oz (98.2 kg)    GEN: Well nourished, well developed in no acute distress; mildly obese NECK: No JVD; No carotid bruits CARDIAC: Normal S1, S2; RRR  with frequent ectopy; no murmurs, rubs, gallops RESPIRATORY:  Clear to auscultation without rales, wheezing or rhonchi ; nonlabored, good air movement. ABDOMEN: Soft, non-tender, non-distended EXTREMITIES:  No edema; No deformity     ASSESSMENT AND PLAN: .    Problem List Items Addressed This Visit       Cardiology Problems   CAD S/P percutaneous coronary angioplasty (Chronic)   Coronary artery disease with inferior MI due to occluded RCA.  Fixed defect seen on Myoview dating back to 2013 also seen on current Myoview.  Slight reduction in EF on Echo from 45 to 50% 40 and 45% with the most recent echo being debrided back in 2005.  Suspect this just is mild progression of disease. No active anginal chest pain or progressive dyspnea.  Exertional dyspnea is easily explained by deconditioning and weight  gain. Lack of activity secondary to knee pain. - Continue current doses of amlodipine 10 mg daily and Toprol-XL 25 mg daily for antianginal benefit and BP control - Continue HCTZ 25 mg daily and 320 mg Diovan for afterload reduction - Continue rosuvastatin 20 mg daily with excellent lipid control - On aspirin monotherapy with no bleeding issues. Okay to hold aspirin 5 to 7 days preop      Relevant Medications   hydrochlorothiazide (HYDRODIURIL) 25 MG tablet   Essential hypertension (Chronic)   Blood pressure elevated, potentially due to pain or stress from knee issues. On antihypertensive medications: Toprol-XL 12.5 mg daily Diovan 320 mg daily, hydrochlorothiazide 25 mg daily and amlodipine 10 mg daily. - Continue current antihypertensive regimen. - Monitor blood pressure regularly, especially around the time of surgery.      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 -Continue rosuvastatin 20 mg daily  Most recent A1c 6.9 in January 2025.  He is not currently on any medications.  Will defer to PCP but suspect initiation of medication.  Will consider SGLT2 inhibitor but would wait till after his surgery as this will need to be held for surgery.      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  Fixed defect seen on Myoview dating back to 2013 also seen on current Myoview.  Slight reduction in EF on Echo from 45 to 50% 40 and 45% with the most recent echo being debrided back in 2005.  Suspect this just is mild progression of disease. No active anginal chest pain or progressive dyspnea.  Exertional dyspnea is easily explained by  deconditioning and weight gain.      Relevant Medications   hydrochlorothiazide (HYDRODIURIL) 25 MG tablet     Other   DOE (dyspnea on exertion) (Chronic)   Most likely related to deconditioning and lack of activity due to worsening knee pain and lack of mobility.  He also has been less active since retiring from work.  Echo does show mild reduction in EF from 2005 although I suspect that this is probably not that significant for prominent an issue.  He is on stable medications.  Continue to encourage dietary modification and once stable postoperatively gradually increase physical activity to improve conditioning.      Preop cardiovascular exam - Primary     Scheduled for knee replacement surgery due to significant knee pain and reduced mobility, contributing to decreased physical activity and deconditioning.  Mr. Paulding perioperative risk of a major cardiac event is 0.9% according to the Revised Cardiac Risk Index (RCRI).  Therefore, he is at low risk for perioperative complications.   His functional capacity is good at 7.01 METs according to the Duke Activity Status Index (DASI). Recommendations: According to ACC/AHA guidelines, no further cardiovascular testing needed.  The patient may proceed to surgery at acceptable risk.   Antiplatelet and/or Anticoagulation Recommendations: Aspirin can be held for 5 days prior to his surgery.  Please resume Aspirin post operatively when it is felt to be safe from a bleeding standpoint.  Not on anticoagulation   RECOMMENDATION: - Proceed with knee and shoulder surgeries as planned. - Hold aspirin and Advil 5 days prior to surgery. - Hold hydrochlorothiazide on the day of surgery. - Take Diovan the day before but not on the day of surgery. - Encourage gradual increase in physical activity post-surgery to improve conditioning.        Recording duration: 22 minutes Follow-Up: Return in about 9 months (around 05/25/2024) for Routine follow up  with me, [employer].  Total time spent: 22 min spent with patient + 22 min spent charting = 44 min I spent 44 minutes in the care of DAICHI MORIS today including reviewing labs (0.5 min ), reviewing outside labs from [facility]CP[facility] (0.5 min), reviewing studies (7 min reviewing Echo & Myoview comparing findings to prior studies), face to face time discussing treatment options (22), 5 min in clinical decision making - Pre-OP Exam, 9 min dictating, and documenting in the encounter.    Signed, Marykay Lex,  MD, MS Bryan Lemma, M.D., M.S. Interventional Cardiologist  [facility]  Pager # [phone] Phone # [phone] [address] [place], [place] [address]

## 2023-08-24 NOTE — Patient Instructions (Addendum)
 Medication Instructions:   No changes, except  do not  your Valsartan  and Hydrochlorothiazide the day of your knee surgery   *If you need a refill on your cardiac medications before your next appointment, please call your pharmacy*   Lab Work: Not needed    Testing/Procedures:  Not needed  Follow-Up: At [facility], you and your health needs are our priority.  As part of our continuing mission to provide you with exceptional heart care, we have created designated Provider Care Teams.  These Care Teams include your primary Cardiologist (physician) and Advanced Practice Providers (APPs -  Physician Assistants and Nurse Practitioners) who all work together to provide you with the care you need, when you need it.     Your next appointment:   9 month(s)  The format for your next appointment:   In Person  Provider:   Bryan Lemma, MD    Other Instructions   You are cleared to have knee surgery from a cardiac standpoint   s

## 2023-08-28 ENCOUNTER — Encounter: Payer: Self-pay | Admitting: Cardiology

## 2023-08-28 NOTE — Assessment & Plan Note (Signed)
 Coronary artery disease with inferior MI due to occluded RCA treated with PCI in 2010.  Fixed defect seen on Myoview dating back to 2013 also seen on current Myoview.  Slight reduction in EF on Echo from 45 to 50% 40 and 45% with the most recent echo being debrided back in 2005.  Suspect this just is mild progression of disease. No active anginal chest pain or progressive dyspnea.  Exertional dyspnea is easily explained by deconditioning and weight gain.

## 2023-08-28 NOTE — Assessment & Plan Note (Addendum)
 Excellent lipid control with TC 109 and LDL 56. -Continue rosuvastatin 20 mg daily  Most recent A1c 6.9 in January 2025.  He is not currently on any medications.  Will defer to PCP but suspect initiation of medication.  Will consider SGLT2 inhibitor but would wait till after his surgery as this will need to be held for surgery.

## 2023-08-28 NOTE — Assessment & Plan Note (Addendum)
 Most likely related to deconditioning and lack of activity due to worsening knee pain and lack of mobility.  He also has been less active since retiring from work.  Echo does show mild reduction in EF from 2005 although I suspect that this is probably not that significant for prominent an issue.  He is on stable medications.  Continue to encourage dietary modification and once stable postoperatively gradually increase physical activity to improve conditioning.

## 2023-08-28 NOTE — Assessment & Plan Note (Addendum)
 Coronary artery disease with inferior MI due to occluded RCA.  Fixed defect seen on Myoview dating back to 2013 also seen on current Myoview.  Slight reduction in EF on Echo from 45 to 50% 40 and 45% with the most recent echo being debrided back in 2005.  Suspect this just is mild progression of disease. No active anginal chest pain or progressive dyspnea.  Exertional dyspnea is easily explained by deconditioning and weight gain. Lack of activity secondary to knee pain. - Continue current doses of amlodipine 10 mg daily and Toprol-XL 25 mg daily for antianginal benefit and BP control - Continue HCTZ 25 mg daily and 320 mg Diovan for afterload reduction - Continue rosuvastatin 20 mg daily with excellent lipid control - On aspirin monotherapy with no bleeding issues. Okay to hold aspirin 5 to 7 days preop

## 2023-08-28 NOTE — Assessment & Plan Note (Addendum)
 Blood pressure elevated, potentially due to pain or stress from knee issues. On antihypertensive medications: Toprol-XL 12.5 mg daily Diovan 320 mg daily, hydrochlorothiazide 25 mg daily and amlodipine 10 mg daily. - Continue current antihypertensive regimen. - Monitor blood pressure regularly, especially around the time of surgery.

## 2023-08-28 NOTE — Assessment & Plan Note (Addendum)
 Scheduled for knee replacement surgery due to significant knee pain and reduced mobility, contributing to decreased physical activity and deconditioning.  Chase Combs perioperative risk of a major cardiac event is 0.9% according to the Revised Cardiac Risk Index (RCRI).  Therefore, he is at low risk for perioperative complications.   His functional capacity is good at 7.01 METs according to the Duke Activity Status Index (DASI). Recommendations: According to ACC/AHA guidelines, no further cardiovascular testing needed.  The patient may proceed to surgery at acceptable risk.   Antiplatelet and/or Anticoagulation Recommendations: Aspirin can be held for 5 days prior to his surgery.  Please resume Aspirin post operatively when it is felt to be safe from a bleeding standpoint.  Not on anticoagulation   RECOMMENDATION: - Proceed with knee and shoulder surgeries as planned. - Hold aspirin and Advil 5 days prior to surgery. - Hold hydrochlorothiazide on the day of surgery. - Take Diovan the day before but not on the day of surgery. - Encourage gradual increase in physical activity post-surgery to improve conditioning.

## 2023-09-07 NOTE — H&P (Signed)
 Patient's anticipated LOS is less than 2 midnights, meeting these requirements: - Younger than 14 - Lives within 1 hour of care - Has a competent adult at home to recover with post-op recover - NO history of  - Chronic pain requiring opiods  - Diabetes  - Coronary Artery Disease  - Heart failure  - Heart attack  - Stroke  - DVT/VTE  - Cardiac arrhythmia  - Respiratory Failure/COPD  - Renal failure  - Anemia  - Advanced Liver disease     Chase Combs is an 78 y.o. male.    Chief Complaint: left knee pain  HPI: Pt is a 78 y.o. male complaining of left knee pain for multiple years. Pain had continually increased since the beginning. X-rays in the clinic show end-stage arthritic changes of the left knee. Pt has tried various conservative treatments which have failed to alleviate their symptoms, including injections and therapy. Various options are discussed with the patient. Risks, benefits and expectations were discussed with the patient. Patient understand the risks, benefits and expectations and wishes to proceed with surgery.   PCP:  Scifres, Dorothy, PA-C (Inactive)  D/C Plans: Home  PMH: Past Medical History:  Diagnosis Date   Abdominal hernia    per pt   Arthritis    hands   Benign localized prostatic hyperplasia with lower urinary tract symptoms (LUTS)    CAD (coronary artery disease) 05/2004   cardiologist--- dr Herbie [place];  positive stress test , had outpt cardiac cath 05-27-2004 stenosis RI;  06-18-2004 cath w/ PCI and BMS x1 to pRI;   STEMI 02-16-2009  s/p cath w/ PCI thrombectomy for total occluded RCA , BMS x1 to pRCA;  nuclear stress test 03-15-2012 low risk no ischemia but sig inferior-inferolateral infarct , ef 51%   Dyslipidemia    Essential hypertension    History of COVID-19 05/2020   per pt mild symptoms that resolved   History of ST elevation myocardial infarction (STEMI) 02/16/2009   inferior STEMI   cath w/ pci w/ BMS to pRCA   History of urinary  retention    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   urologist--- dr borden/ radiation oncology-- dr Kathrynn Running;  dx 12/ 2022,  Gleason 4+5, PSA 24.6, volume 94.6cc;  plan to start IMRT   OSA (obstructive sleep apnea)    per pt dx approx 2008, no cpap intolerant   Pre-diabetes    S/p bare metal coronary artery stent    01/ 2006  x1 BMS to pRI (CoStar study stent);  and 09/ 2010  x1 BMS to pRCA    PSH: Past Surgical History:  Procedure Laterality Date   CARDIAC CATHETERIZATION  05/27/2004   outpatient by dr Tresa Endo [facility] cardiology) for positive stress test;  80% stensis pRI   CORONARY ANGIOPLASTY WITH STENT PLACEMENT  06/18/2004   @[facility]  by Dr Elsie Lincoln;  PCI w/ BMS to pRI  (CoStar study stent - 2.5 x 16)   CORONARY ANGIOPLASTY WITH STENT PLACEMENT  02/16/2009   @[facility]  by dr Clifton James;   Inferior STEMI:  PCI w/ thrombectomy total occluded RCA, BMS x1 to pRCA (Vision BMS 3.5 x 23 -> 4.0 mm), residual 40-50% pLAD, ef 45-50%   GOLD SEED IMPLANT N/A 09/19/2021   Procedure: GOLD SEED IMPLANT;  Surgeon: Crist Fat, MD;  Location: [facility];  Service: Urology;  Laterality: N/A;  ONLY NEEDS 30 MIN FOR ALL   INGUINAL HERNIA REPAIR Right 09/02/1999   @[facility] ;   recurrent repair  right inguinal hernia;   previous repair approx 1990s   INGUINAL HERNIA REPAIR Left    1990s   KNEE ARTHROSCOPY W/ MENISCAL REPAIR Right 12/18/2005   @[facility]    LUMBAR DISC SURGERY  1982   L5--S1   SPACE OAR INSTILLATION N/A 09/19/2021   Procedure: SPACE OAR INSTILLATION;  Surgeon: Crist Fat, MD;  Location: [facility];  Service: Urology;  Laterality: N/A;    Social History:  reports that he has never smoked. He has never used smokeless tobacco. He reports that he does not drink alcohol and does not use drugs. BMI: Estimated body mass index is 31.9 kg/m as calculated from the following:   Height as of 08/24/23: 5\' 9"  (1.753 m).   Weight as of 08/24/23: 98 kg.  Lab Results   Component Value Date   ALBUMIN 4.6 06/06/2021   Diabetes:   Patient has a diagnosis of diabetes,  Lab Results  Component Value Date   HGBA1C 6.9 (H) 03/01/2020   Smoking Status:   reports that he has never smoked. He has never used smokeless tobacco.    Allergies:  Allergies  Allergen Reactions   Codeine Nausea And Vomiting    Medications: No current facility-administered medications for this encounter.   Current Outpatient Medications  Medication Sig Dispense Refill   amLODipine (NORVASC) 10 MG tablet TAKE 1 TABLET BY MOUTH EVERY DAY 30 tablet 0   APPLE CIDER VINEGAR PO Take 1 capsule by mouth once a week.     aspirin EC 81 MG tablet Take 1 tablet (81 mg total) by mouth daily. 90 tablet 3   furosemide (LASIX) 20 MG tablet Take 1 tablet (20 mg total) by mouth daily. (Patient not taking: Reported on 08/24/2023) 90 tablet 3   hydrochlorothiazide (HYDRODIURIL) 25 MG tablet Take 25 mg by mouth daily.     metoprolol succinate (TOPROL-XL) 25 MG 24 hr tablet Take 0.5 tablets (12.5 mg total) by mouth daily. 45 tablet 3   Multiple Vitamin (MULTIVITAMIN) tablet Take 1 tablet by mouth daily.     oseltamivir (TAMIFLU) 75 MG capsule Take 1 capsule (75 mg total) by mouth every 12 (twelve) hours. 10 capsule 0   rosuvastatin (CRESTOR) 20 MG tablet Take 1 tablet (20 mg total) by mouth daily. 90 tablet 3   tamsulosin (FLOMAX) 0.4 MG CAPS capsule Take 1 capsule (0.4 mg total) by mouth daily after supper. 30 capsule 5   valsartan (DIOVAN) 320 MG tablet TAKE 1 TABLET BY MOUTH EVERY DAY 90 tablet 3   vitamin B-12 (CYANOCOBALAMIN) 50 MCG tablet Take 50 mcg by mouth daily.      No results found for this or any previous visit (from the past 48 hours). No results found.  ROS: Pain with rom of the left lower extremity  Physical Exam: Alert and oriented 78 y.o. male in no acute distress Cranial nerves 2-12 intact Cervical spine: full rom with no tenderness, nv intact distally Chest: active  breath sounds bilaterally, no wheeze rhonchi or rales Heart: regular rate and rhythm, no murmur Abd: non tender non distended with active bowel sounds Hip is stable with rom  Left knee painful rom with crepitus Nv intact distally No rashes or edema distally Antalgic gait   Assessment/Plan Assessment: left knee end stage osteoarthritis  Plan:  Patient will undergo a left total knee by Dr. Ranell Patrick at [place] Risks benefits and expectations were discussed with the patient. Patient understand risks, benefits and expectations and wishes to proceed. Preoperative templating  of the joint replacement has been completed, documented, and submitted to the Operating Room personnel in order to optimize intra-operative equipment management.   Lorina Roosevelt PA-C, MPAS [facility] is now [employer] [address], [place], [place] [address] Phone: [phone] [email or web address] Facebook  [employer]

## 2023-09-15 DIAGNOSIS — D49512 Neoplasm of unspecified behavior of left kidney: Secondary | ICD-10-CM | POA: Diagnosis not present

## 2023-09-21 NOTE — Patient Instructions (Signed)
 SURGICAL WAITING ROOM VISITATION  Patients having surgery or a procedure may have no more than 2 support people in the waiting area - these visitors may rotate.    Children under the age of 40 must have an adult with them who is not the patient.  Due to an increase in RSV and influenza rates and associated hospitalizations, children ages 74 and under may not visit patients in [facility] hospitals.  Visitors with respiratory illnesses are discouraged from visiting and should remain at home.  If the patient needs to stay at the hospital during part of their recovery, the visitor guidelines for inpatient rooms apply. Pre-op nurse will coordinate an appropriate time for 1 support person to accompany patient in pre-op.  This support person may not rotate.    Please refer to the [facility] website for the visitor guidelines for Inpatients (after your surgery is over and you are in a regular room).    Your procedure is scheduled on: 10/01/23   Report to [facility] Main Entrance    Report to admitting at 7:25 AM   Call this number if you have problems the morning of surgery [phone]   Do not eat food :After Midnight.   After Midnight you may have the following liquids until 6:55 AM DAY OF SURGERY  Water Non-Citrus Juices (without pulp, NO RED-Apple, White grape, White cranberry) Black Coffee (NO MILK/CREAM OR CREAMERS, sugar ok)  Clear Tea (NO MILK/CREAM OR CREAMERS, sugar ok) regular and decaf                             Plain Jell-O (NO RED)                                           Fruit ices (not with fruit pulp, NO RED)                                     Popsicles (NO RED)                                                               Sports drinks like Gatorade (NO RED)     The day of surgery:  Drink ONE (1) Pre-Surgery G2 at 6:55 AM the morning of surgery. Drink in one sitting. Do not sip.  This drink was given to you during your hospital  pre-op appointment  visit. Nothing else to drink after completing the  Pre-Surgery G2.          If you have questions, please contact your surgeon's office.   FOLLOW BOWEL PREP AND ANY ADDITIONAL PRE OP INSTRUCTIONS YOU RECEIVED FROM YOUR SURGEON'S OFFICE!!!     Oral Hygiene is also important to reduce your risk of infection.                                    Remember - BRUSH YOUR TEETH THE MORNING OF SURGERY WITH YOUR REGULAR TOOTHPASTE  DENTURES WILL  BE REMOVED PRIOR TO SURGERY PLEASE DO NOT APPLY "Poly grip" OR ADHESIVES!!!   Stop all vitamins and herbal supplements 7 days before surgery.   Take these medicines the morning of surgery with A SIP OF WATER: Amlodipine , Metoprolol                                You may not have any metal on your body including jewelry, and body piercing             Do not wear lotions, powders, cologne, or deodorant              Men may shave face and neck.   Do not bring valuables to the hospital. [facility] IS NOT             RESPONSIBLE   FOR VALUABLES.   Contacts, glasses, dentures or bridgework may not be worn into surgery.  DO NOT BRING YOUR HOME MEDICATIONS TO THE HOSPITAL. PHARMACY WILL DISPENSE MEDICATIONS LISTED ON YOUR MEDICATION LIST TO YOU DURING YOUR ADMISSION IN THE HOSPITAL!    Patients discharged on the day of surgery will not be allowed to drive home.  Someone NEEDS to stay with you for the first 24 hours after anesthesia.              Please read over the following fact sheets you were given: IF YOU HAVE QUESTIONS ABOUT YOUR PRE-OP INSTRUCTIONS PLEASE CALL [name]    If you received a COVID test during your pre-op visit  it is requested that you wear a mask when out in public, stay away from anyone that may not be feeling well and notify your surgeon if you develop symptoms. If you test positive for Covid or have been in contact with anyone that has tested positive in the last 10 days please notify you surgeon.      Pre-operative  5 CHG Bath Instructions   You can play a key role in reducing the risk of infection after surgery. Your skin needs to be as free of germs as possible. You can reduce the number of germs on your skin by washing with CHG (chlorhexidine gluconate) soap before surgery. CHG is an antiseptic soap that kills germs and continues to kill germs even after washing.   DO NOT use if you have an allergy to chlorhexidine/CHG or antibacterial soaps. If your skin becomes reddened or irritated, stop using the CHG and notify one of our RNs at [phone].   Please shower with the CHG soap starting 4 days before surgery using the following schedule:     Please keep in mind the following:  DO NOT shave, including legs and underarms, starting the day of your first shower.   You may shave your face at any point before/day of surgery.  Place clean sheets on your bed the day you start using CHG soap. Use a clean washcloth (not used since being washed) for each shower. DO NOT sleep with pets once you start using the CHG.   CHG Shower Instructions:  If you choose to wash your hair and private area, wash first with your normal shampoo/soap.  After you use shampoo/soap, rinse your hair and body thoroughly to remove shampoo/soap residue.  Turn the water OFF and apply about 3 tablespoons (45 ml) of CHG soap to a CLEAN washcloth.  Apply CHG soap ONLY FROM YOUR NECK DOWN TO YOUR TOES (washing for 3-5  minutes)  DO NOT use CHG soap on face, private areas, open wounds, or sores.  Pay special attention to the area where your surgery is being performed.  If you are having back surgery, having someone wash your back for you may be helpful. Wait 2 minutes after CHG soap is applied, then you may rinse off the CHG soap.  Pat dry with a clean towel  Put on clean clothes/pajamas   If you choose to wear lotion, please use ONLY the CHG-compatible lotions on the back of this paper.     Additional instructions for the day of  surgery: DO NOT APPLY any lotions, deodorants, cologne, or perfumes.   Put on clean/comfortable clothes.  Brush your teeth.  Ask your nurse before applying any prescription medications to the skin.      CHG Compatible Lotions   Aveeno Moisturizing lotion  Cetaphil Moisturizing Cream  Cetaphil Moisturizing Lotion  Clairol Herbal Essence Moisturizing Lotion, Dry Skin  Clairol Herbal Essence Moisturizing Lotion, Extra Dry Skin  Clairol Herbal Essence Moisturizing Lotion, Normal Skin  Curel Age Defying Therapeutic Moisturizing Lotion with Alpha Hydroxy  Curel Extreme Care Body Lotion  Curel Soothing Hands Moisturizing Hand Lotion  Curel Therapeutic Moisturizing Cream, Fragrance-Free  Curel Therapeutic Moisturizing Lotion, Fragrance-Free  Curel Therapeutic Moisturizing Lotion, Original Formula  Eucerin Daily Replenishing Lotion  Eucerin Dry Skin Therapy Plus Alpha Hydroxy Crme  Eucerin Dry Skin Therapy Plus Alpha Hydroxy Lotion  Eucerin Original Crme  Eucerin Original Lotion  Eucerin Plus Crme Eucerin Plus Lotion  Eucerin TriLipid Replenishing Lotion  Keri Anti-Bacterial Hand Lotion  Keri Deep Conditioning Original Lotion Dry Skin Formula Softly Scented  Keri Deep Conditioning Original Lotion, Fragrance Free Sensitive Skin Formula  Keri Lotion Fast Absorbing Fragrance Free Sensitive Skin Formula  Keri Lotion Fast Absorbing Softly Scented Dry Skin Formula  Keri Original Lotion  Keri Skin Renewal Lotion Keri Silky Smooth Lotion  Keri Silky Smooth Sensitive Skin Lotion  Nivea Body Creamy Conditioning Oil  Nivea Body Extra Enriched Lotion  Nivea Body Original Lotion  Nivea Body Sheer Moisturizing Lotion Nivea Crme  Nivea Skin Firming Lotion  NutraDerm 30 Skin Lotion  NutraDerm Skin Lotion  NutraDerm Therapeutic Skin Cream  NutraDerm Therapeutic Skin Lotion  ProShield Protective Hand Cream  Provon moisturizing lotion   View Pre-Surgery Education  Videos:  [email or web address]     Incentive Spirometer  An incentive spirometer is a tool that can help keep your lungs clear and active. This tool measures how well you are filling your lungs with each breath. Taking long deep breaths may help reverse or decrease the chance of developing breathing (pulmonary) problems (especially infection) following: A long period of time when you are unable to move or be active. BEFORE THE PROCEDURE  If the spirometer includes an indicator to show your best effort, your nurse or respiratory therapist will set it to a desired goal. If possible, sit up straight or lean slightly forward. Try not to slouch. Hold the incentive spirometer in an upright position. INSTRUCTIONS FOR USE  Sit on the edge of your bed if possible, or sit up as far as you can in bed or on a chair. Hold the incentive spirometer in an upright position. Breathe out normally. Place the mouthpiece in your mouth and seal your lips tightly around it. Breathe in slowly and as deeply as possible, raising the piston or the ball toward the top of the column. Hold your breath for 3-5 seconds or for as long as possible.  Allow the piston or ball to fall to the bottom of the column. Remove the mouthpiece from your mouth and breathe out normally. Rest for a few seconds and repeat Steps 1 through 7 at least 10 times every 1-2 hours when you are awake. Take your time and take a few normal breaths between deep breaths. The spirometer may include an indicator to show your best effort. Use the indicator as a goal to work toward during each repetition. After each set of 10 deep breaths, practice coughing to be sure your lungs are clear. If you have an incision (the cut made at the time of surgery), support your incision when coughing by placing a pillow or rolled up towels firmly against it. Once you are able to get out of bed, walk around indoors  and cough well. You may stop using the incentive spirometer when instructed by your caregiver.  RISKS AND COMPLICATIONS Take your time so you do not get dizzy or light-headed. If you are in pain, you may need to take or ask for pain medication before doing incentive spirometry. It is harder to take a deep breath if you are having pain. AFTER USE Rest and breathe slowly and easily. It can be helpful to keep track of a log of your progress. Your caregiver can provide you with a simple table to help with this. If you are using the spirometer at home, follow these instructions: SEEK MEDICAL CARE IF:  You are having difficultly using the spirometer. You have trouble using the spirometer as often as instructed. Your pain medication is not giving enough relief while using the spirometer. You develop fever of 100.5 F (38.1 C) or higher. SEEK IMMEDIATE MEDICAL CARE IF:  You cough up bloody sputum that had not been present before. You develop fever of 102 F (38.9 C) or greater. You develop worsening pain at or near the incision site. MAKE SURE YOU:  Understand these instructions. Will watch your condition. Will get help right away if you are not doing well or get worse. Document Released: 09/21/2006 Document Revised: 08/03/2011 Document Reviewed: 11/22/2006 [facility] Patient Information 2014 [place], [place].   ________________________________________________________________________

## 2023-09-21 NOTE — Progress Notes (Addendum)
 COVID Vaccine Completed:  Date of COVID positive in last 90 days:  PCP - Joneen Nelson, PA Cardiologist -  Randene Bustard, MD LOV 08/24/23  Cardiac clearance by Randene Bustard, MD 08/24/23 in Epic   Chest x-ray - 06/17/23 Epic EKG - 06/22/23 Epic Stress Test - 07/12/23 Epic ECHO - 07/12/23 Epic Cardiac Cath - 2006 Pacemaker/ICD device last checked: Spinal Cord Stimulator:  Bowel Prep -   Sleep Study -  CPAP -   Fasting Blood Sugar - DM2? Checks Blood Sugar _____ times a day  Last dose of GLP1 agonist-  N/A GLP1 instructions:  Hold 7 days before surgery    Last dose of SGLT-2 inhibitors-  N/A SGLT-2 instructions:  Hold 3 days before surgery    Blood Thinner Instructions:  Last dose:   Time: Aspirin  Instructions: ASA 81, hold 5-7 days Last Dose:  Activity level:  Can go up a flight of stairs and perform activities of daily living without stopping and without symptoms of chest pain or shortness of breath.  Able to exercise without symptoms  Unable to go up a flight of stairs without symptoms of     Anesthesia review: STEMI, HTN, DM2, DOE, CAD, stent x1  Patient denies shortness of breath, fever, cough and chest pain at PAT appointment  Patient verbalized understanding of instructions that were given to them at the PAT appointment. Patient was also instructed that they will need to review over the PAT instructions again at home before surgery.

## 2023-09-22 ENCOUNTER — Encounter (HOSPITAL_COMMUNITY): Payer: Self-pay

## 2023-09-22 ENCOUNTER — Encounter (HOSPITAL_COMMUNITY)
Admission: RE | Admit: 2023-09-22 | Discharge: 2023-09-22 | Disposition: A | Discharge status: Home / Self Care | Source: Ambulatory Visit | Attending: Orthopedic Surgery | Admitting: Orthopedic Surgery

## 2023-09-22 ENCOUNTER — Other Ambulatory Visit: Payer: Self-pay

## 2023-09-22 VITALS — BP 137/90 | HR 77 | Temp 98.2°F | Resp 16 | Ht 69.0 in | Wt 212.0 lb

## 2023-09-22 DIAGNOSIS — Z01818 Encounter for other preprocedural examination: Secondary | ICD-10-CM | POA: Diagnosis present

## 2023-09-22 DIAGNOSIS — Z8546 Personal history of malignant neoplasm of prostate: Secondary | ICD-10-CM | POA: Diagnosis not present

## 2023-09-22 DIAGNOSIS — M1712 Unilateral primary osteoarthritis, left knee: Secondary | ICD-10-CM | POA: Insufficient documentation

## 2023-09-22 DIAGNOSIS — E119 Type 2 diabetes mellitus without complications: Secondary | ICD-10-CM | POA: Diagnosis not present

## 2023-09-22 DIAGNOSIS — Z955 Presence of coronary angioplasty implant and graft: Secondary | ICD-10-CM | POA: Diagnosis not present

## 2023-09-22 DIAGNOSIS — I10 Essential (primary) hypertension: Secondary | ICD-10-CM

## 2023-09-22 DIAGNOSIS — Z01812 Encounter for preprocedural laboratory examination: Secondary | ICD-10-CM | POA: Insufficient documentation

## 2023-09-22 DIAGNOSIS — I110 Hypertensive heart disease with heart failure: Secondary | ICD-10-CM | POA: Diagnosis not present

## 2023-09-22 DIAGNOSIS — G4733 Obstructive sleep apnea (adult) (pediatric): Secondary | ICD-10-CM | POA: Insufficient documentation

## 2023-09-22 DIAGNOSIS — I2510 Atherosclerotic heart disease of native coronary artery without angina pectoris: Secondary | ICD-10-CM | POA: Insufficient documentation

## 2023-09-22 DIAGNOSIS — I252 Old myocardial infarction: Secondary | ICD-10-CM | POA: Diagnosis not present

## 2023-09-22 DIAGNOSIS — I509 Heart failure, unspecified: Secondary | ICD-10-CM | POA: Diagnosis not present

## 2023-09-22 DIAGNOSIS — R011 Cardiac murmur, unspecified: Secondary | ICD-10-CM | POA: Diagnosis not present

## 2023-09-22 HISTORY — DX: Nausea with vomiting, unspecified: R11.2

## 2023-09-22 HISTORY — DX: Cardiac murmur, unspecified: R01.1

## 2023-09-22 HISTORY — DX: Other specified postprocedural states: Z98.890

## 2023-09-22 LAB — BASIC METABOLIC PANEL WITH GFR
Anion gap: 10 (ref 5–15)
BUN: 14 mg/dL (ref 8–23)
CO2: 27 mmol/L (ref 22–32)
Calcium: 9.4 mg/dL (ref 8.9–10.3)
Chloride: 103 mmol/L (ref 98–111)
Creatinine, Ser: 0.6 mg/dL — ABNORMAL LOW (ref 0.61–1.24)
GFR, Estimated: >60 mL/min (ref >60)
Glucose, Bld: 150 mg/dL — ABNORMAL HIGH (ref 70–99)
Potassium: 3.9 mmol/L (ref 3.5–5.1)
Sodium: 140 mmol/L (ref 135–145)

## 2023-09-22 LAB — HEMOGLOBIN A1C
Hgb A1c MFr Bld: 6.4 % — ABNORMAL HIGH (ref 4.8–5.6)
Mean Plasma Glucose: 136.98 mg/dL

## 2023-09-22 LAB — CBC
HCT: 40 % (ref 39.0–52.0)
Hemoglobin: 13 g/dL (ref 13.0–17.0)
MCH: 30.4 pg (ref 26.0–34.0)
MCHC: 32.5 g/dL (ref 30.0–36.0)
MCV: 93.7 fL (ref 80.0–100.0)
Platelets: 271 10*3/uL (ref 150–400)
RBC: 4.27 MIL/uL (ref 4.22–5.81)
RDW: 14 % (ref 11.5–15.5)
WBC: 6 10*3/uL (ref 4.0–10.5)
nRBC: 0 % (ref 0.0–0.2)

## 2023-09-22 LAB — SURGICAL PCR SCREEN
MRSA, PCR: NEGATIVE
Staphylococcus aureus: NEGATIVE

## 2023-09-23 ENCOUNTER — Encounter (HOSPITAL_COMMUNITY): Payer: Self-pay

## 2023-09-23 NOTE — Anesthesia Preprocedure Evaluation (Addendum)
 Anesthesia Evaluation  Patient identified by MRN, date of birth, ID band Patient awake    Reviewed: Allergy & Precautions, NPO status , Patient's Chart, lab work & pertinent test results  History of Anesthesia Complications (+) PONV and history of anesthetic complications  Airway Mallampati: II  TM Distance: >3 FB Neck ROM: Full    Dental no notable dental hx. (+) Missing, Dental Advisory Given, Partial Upper,    Pulmonary sleep apnea (no cpap)    Pulmonary exam normal breath sounds clear to auscultation       Cardiovascular hypertension, Pt. on medications and Pt. on home beta blockers (-) angina + CAD and + Past MI (2006 2010)  Normal cardiovascular exam Rhythm:Regular Rate:Normal  07/12/2023 Echo  1. Left ventricular ejection fraction, by estimation, is 40 to 45%. The  left ventricle has mildly decreased function. The left ventricle  demonstrates global hypokinesis. The left ventricular internal cavity size  was moderately to severely dilated. There   is mild concentric left ventricular hypertrophy. Left ventricular  diastolic parameters are consistent with Grade I diastolic dysfunction  (impaired relaxation). The average left ventricular global longitudinal  strain is -15.2 %. The global longitudinal  strain is normal.   2. Right ventricular systolic function is normal. The right ventricular  size is normal.   3. Left atrial size was mild to moderately dilated.   4. The mitral valve is normal in structure. Trivial mitral valve  regurgitation. No evidence of mitral stenosis.   5. The aortic valve is normal in structure. Aortic valve regurgitation is  not visualized. No aortic stenosis is present.   6. There is mild dilatation of the aortic root, measuring 40 mm.   7. The inferior vena cava is normal in size with greater than 50%  respiratory variability, suggesting right atrial pressure of 3 mmHg.  07/12/2023 Myoview   Stress test    Findings are consistent with infarction. The study is high risk.   No ST deviation was noted.   LV perfusion is abnormal. There is no evidence of ischemia. There is evidence of infarction. Defect 1: There is a medium defect with moderate reduction in uptake present in the apical to basal inferior and apex location(s) that is fixed. There is abnormal wall motion in the defect area. Consistent with infarction.   Left ventricular function is abnormal. Nuclear stress EF: 31%. The left ventricular ejection fraction is moderately decreased (30-44%). End diastolic cavity size is severely enlarged. End systolic cavity size is severely enlarged.   Prior study available for comparison from 03/05/2012.   Fixed inferior perfusion defect with abnormal wall motion consistent with infarct LVEF 31% High risk study due to systolic dysfunction.  No ischemia     Neuro/Psych    GI/Hepatic negative GI ROS, Neg liver ROS,,,  Endo/Other  neg diabetes    Renal/GU negative Renal ROSLab Results      Component                Value               Date                            K                        3.9                 09/22/2023  CO2                      27                  09/22/2023                BUN                      14                  09/22/2023                CREATININE               0.60 (L)            09/22/2023                GFRNONAA                 >60                 09/22/2023                 GLUCOSE                  150 (H)             09/22/2023                Musculoskeletal  (+) Arthritis , Osteoarthritis,    Abdominal   Peds  Hematology Lab Results      Component                Value               Date                      WBC                      6.0                 09/22/2023                HGB                      13.0                09/22/2023                HCT                      40.0                09/22/2023                MCV                       93.7                09/22/2023                PLT                      271                 09/22/2023  Anesthesia Other Findings All: codeine  Reproductive/Obstetrics                             Anesthesia Physical Anesthesia Plan  ASA: 3  Anesthesia Plan: Spinal and Regional   Post-op Pain Management: Regional block*   Induction:   PONV Risk Score and Plan: 2 and Propofol  infusion, Treatment may vary due to age or medical condition and Ondansetron   Airway Management Planned: Nasal Cannula and Natural Airway  Additional Equipment: None  Intra-op Plan:   Post-operative Plan:   Informed Consent: I have reviewed the patients History and Physical, chart, labs and discussed the procedure including the risks, benefits and alternatives for the proposed anesthesia with the patient or authorized representative who has indicated his/her understanding and acceptance.     Dental advisory given  Plan Discussed with: CRNA and Surgeon  Anesthesia Plan Comments: (See PAT note from 4/30  Spinal w L adductor)        Anesthesia Quick Evaluation

## 2023-09-23 NOTE — Progress Notes (Signed)
 Case: [id number] Date/Time: 10/01/23 0855   Procedure: ARTHROPLASTY, KNEE, TOTAL (Left: Knee)   Anesthesia type: Choice   Diagnosis: Primary osteoarthritis of left knee [M17.12]   Pre-op diagnosis: Left knee osteoarthritis   Location: [facility] ROOM 06 / [facility]   Surgeons: Winston Hawking, MD       DISCUSSION: Damarr Blaske is a 78 yo male who presents to PAT prior to surgery above. PMH of HTN, hx of STEMI and CAD s/p PCI (2006, 2010), CHF, heart murmur, OSA (no CPAP), prediabetes, prostate cancer s/p brachytherapy (2023), arthritis  Prior anesthesia complications include PONV  Patient follows with Cardiology for hx of STEMI and CAD s/p PCI in 2006 and 2010 and CHF. Last seen in clinic on 08/24/23 for pre op visit. He reported chest pain and DOE. He has had this in the past and had a stress test and echo done on 07/12/23 which showed evidence of prior infarction but no ischemia. Was read as high risk due to EF calculated as 33%. Echo done the same day calculated EF to be 40-45% with grade I DD. Symptoms were felt to be due to deconditioning. Per Dr. Addie Holstein patient was cleared for surgery:  "Scheduled for knee replacement surgery due to significant knee pain and reduced mobility, contributing to decreased physical activity and deconditioning. Mr. Krapp perioperative risk of a major cardiac event is 0.9% according to the Revised Cardiac Risk Index (RCRI).  Therefore, he is at low risk for perioperative complications.   His functional capacity is good at 7.01 METs according to the Duke Activity Status Index (DASI). RECOMMENDATION: - Proceed with knee and shoulder surgeries as planned. - Hold aspirin  and Advil 5 days prior to surgery. - Hold hydrochlorothiazide  on the day of surgery. - Take Diovan  the day before but not on the day of surgery. - Encourage gradual increase in physical activity post-surgery to improve conditioning."  VS: BP (!) 137/90   Pulse 77   Temp 36.8 C (Oral)   Resp 16   Ht 5'  9" (1.753 m)   Wt 96.2 kg   SpO2 92%   BMI 31.31 kg/m   PROVIDERS: Turmel, Caleb P, PA   LABS: Labs reviewed: Acceptable for surgery. (all labs ordered are listed, but only abnormal results are displayed)  Labs Reviewed  HEMOGLOBIN A1C - Abnormal; Notable for the following components:      Result Value   Hgb A1c MFr Bld 6.4 (*)    All other components within normal limits  BASIC METABOLIC PANEL WITH GFR - Abnormal; Notable for the following components:   Glucose, Bld 150 (*)    Creatinine, Ser 0.60 (*)    All other components within normal limits  SURGICAL PCR SCREEN  CBC     IMAGES: CXR 06/17/23:  FINDINGS: Stable cardiomediastinal silhouette. Aortic atherosclerotic calcification. Left basilar atelectasis. Scarring in the right mid lung. No pleural effusion or pneumothorax. Elevated right hemidiaphragm.   IMPRESSION: No active cardiopulmonary disease.    EKG 06/17/23 SR, rate 98 Bifasicular block PVCs  CV:  Echo 07/12/23:  IMPRESSIONS     1. Left ventricular ejection fraction, by estimation, is 40 to 45%. The  left ventricle has mildly decreased function. The left ventricle  demonstrates global hypokinesis. The left ventricular internal cavity size  was moderately to severely dilated. There   is mild concentric left ventricular hypertrophy. Left ventricular  diastolic parameters are consistent with Grade I diastolic dysfunction  (impaired relaxation). The average left ventricular global longitudinal  strain  is -15.2 %. The global longitudinal  strain is normal.   2. Right ventricular systolic function is normal. The right ventricular  size is normal.   3. Left atrial size was mild to moderately dilated.   4. The mitral valve is normal in structure. Trivial mitral valve  regurgitation. No evidence of mitral stenosis.   5. The aortic valve is normal in structure. Aortic valve regurgitation is  not visualized. No aortic stenosis is present.   6. There is  mild dilatation of the aortic root, measuring 40 mm.   7. The inferior vena cava is normal in size with greater than 50%  respiratory variability, suggesting right atrial pressure of 3 mmHg.   Stress test 07/12/2023:    Findings are consistent with infarction. The study is high risk.   No ST deviation was noted.   LV perfusion is abnormal. There is no evidence of ischemia. There is evidence of infarction. Defect 1: There is a medium defect with moderate reduction in uptake present in the apical to basal inferior and apex location(s) that is fixed. There is abnormal wall motion in the defect area. Consistent with infarction.   Left ventricular function is abnormal. Nuclear stress EF: 31%. The left ventricular ejection fraction is moderately decreased (30-44%). End diastolic cavity size is severely enlarged. End systolic cavity size is severely enlarged.   Prior study available for comparison from 03/05/2012.   Fixed inferior perfusion defect with abnormal wall motion consistent with infarct LVEF 31% High risk study due to systolic dysfunction.  No ischemia Past Medical History:  Diagnosis Date   Abdominal hernia    per pt   Arthritis    hands   Benign localized prostatic hyperplasia with lower urinary tract symptoms (LUTS)    CAD (coronary artery disease) 05/2004   cardiologist--- dr Addie Holstein;  positive stress test , had outpt cardiac cath 05-27-2004 stenosis RI;  06-18-2004 cath w/ PCI and BMS x1 to pRI;   STEMI 02-16-2009  s/p cath w/ PCI thrombectomy for total occluded RCA , BMS x1 to pRCA;  nuclear stress test 03-15-2012 low risk no ischemia but sig inferior-inferolateral infarct , ef 51%   Dyslipidemia    Essential hypertension    Heart murmur    History of COVID-19 05/2020   per pt mild symptoms that resolved   History of ST elevation myocardial infarction (STEMI) 02/16/2009   inferior STEMI   cath w/ pci w/ BMS to pRCA   History of urinary retention    Malignant neoplasm of  prostate [facility]) 04/2021   urologist--- dr borden/ radiation oncology-- dr Lorri Rota;  dx 12/ 2022,  Gleason 4+5, PSA 24.6, volume 94.6cc;  plan to start IMRT   OSA (obstructive sleep apnea)    per pt dx approx 2008, no cpap intolerant   PONV (postoperative nausea and vomiting)    Pre-diabetes    S/p bare metal coronary artery stent    01/ 2006  x1 BMS to pRI (CoStar study stent);  and 09/ 2010  x1 BMS to [facility]    Past Surgical History:  Procedure Laterality Date   CARDIAC CATHETERIZATION  05/27/2004   outpatient by dr Loetta Ringer [facility] cardiology) for positive stress test;  80% stensis pRI   CORONARY ANGIOPLASTY WITH STENT PLACEMENT  06/18/2004   @[facility]  by Dr Ebbie Goldmann;  PCI w/ BMS to pRI  (CoStar study stent - 2.5 x 16)   CORONARY ANGIOPLASTY WITH STENT PLACEMENT  02/16/2009   @[facility]  by dr Abel Hoe;   Inferior  STEMI:  PCI w/ thrombectomy total occluded RCA, BMS x1 to pRCA (Vision BMS 3.5 x 23 -> 4.0 mm), residual 40-50% pLAD, ef 45-50%   GOLD SEED IMPLANT N/A 09/19/2021   Procedure: GOLD SEED IMPLANT;  Surgeon: Andrez Banker, MD;  Location: [facility];  Service: Urology;  Laterality: N/A;  ONLY NEEDS 30 MIN FOR ALL   INGUINAL HERNIA REPAIR Right 09/02/1999   @[facility] ;   recurrent repair right inguinal hernia;   previous repair approx 1990s   INGUINAL HERNIA REPAIR Left    1990s   KNEE ARTHROSCOPY W/ MENISCAL REPAIR Right 12/18/2005   @[facility]    LUMBAR DISC SURGERY  1982   L5--S1   SPACE OAR INSTILLATION N/A 09/19/2021   Procedure: SPACE OAR INSTILLATION;  Surgeon: Andrez Banker, MD;  Location: [facility];  Service: Urology;  Laterality: N/A;    MEDICATIONS:  amLODipine  (NORVASC ) 10 MG tablet   aspirin  EC 81 MG tablet   Cannabidiol POWD   furosemide  (LASIX ) 20 MG tablet   hydrochlorothiazide  (HYDRODIURIL ) 25 MG tablet   ibuprofen (ADVIL) 200 MG tablet   metoprolol  succinate (TOPROL -XL) 25 MG 24 hr tablet   Multiple Vitamin (MULTIVITAMIN WITH MINERALS)  TABS tablet   rosuvastatin  (CRESTOR ) 20 MG tablet   tamsulosin  (FLOMAX ) 0.4 MG CAPS capsule   valsartan  (DIOVAN ) 320 MG tablet   No current facility-administered medications for this encounter.   Antoinette Kirschner [facility] Surgical Short Stay/Anesthesiology [facility] Phone [phone] 09/23/2023 10:22 AM

## 2023-10-01 ENCOUNTER — Ambulatory Visit (HOSPITAL_COMMUNITY): Payer: Self-pay | Admitting: Anesthesiology

## 2023-10-01 ENCOUNTER — Ambulatory Visit (HOSPITAL_COMMUNITY): Payer: Self-pay | Admitting: Medical

## 2023-10-01 ENCOUNTER — Encounter (HOSPITAL_COMMUNITY)
Admission: RE | Disposition: A | Discharge status: Home / Self Care | Payer: Self-pay | Source: Ambulatory Visit | Attending: Orthopedic Surgery

## 2023-10-01 ENCOUNTER — Encounter (HOSPITAL_COMMUNITY): Payer: Self-pay | Admitting: Orthopedic Surgery

## 2023-10-01 ENCOUNTER — Other Ambulatory Visit: Payer: Self-pay

## 2023-10-01 ENCOUNTER — Observation Stay (HOSPITAL_COMMUNITY)
Admission: RE | Admit: 2023-10-01 | Discharge: 2023-10-04 | Disposition: A | Discharge status: Home / Self Care | Source: Ambulatory Visit | Attending: Orthopedic Surgery | Admitting: Orthopedic Surgery

## 2023-10-01 DIAGNOSIS — M1712 Unilateral primary osteoarthritis, left knee: Secondary | ICD-10-CM

## 2023-10-01 DIAGNOSIS — Z8616 Personal history of COVID-19: Secondary | ICD-10-CM | POA: Insufficient documentation

## 2023-10-01 DIAGNOSIS — E119 Type 2 diabetes mellitus without complications: Secondary | ICD-10-CM

## 2023-10-01 DIAGNOSIS — I2510 Atherosclerotic heart disease of native coronary artery without angina pectoris: Secondary | ICD-10-CM | POA: Diagnosis not present

## 2023-10-01 DIAGNOSIS — I10 Essential (primary) hypertension: Secondary | ICD-10-CM

## 2023-10-01 DIAGNOSIS — Z955 Presence of coronary angioplasty implant and graft: Secondary | ICD-10-CM | POA: Insufficient documentation

## 2023-10-01 DIAGNOSIS — Z7982 Long term (current) use of aspirin: Secondary | ICD-10-CM | POA: Diagnosis not present

## 2023-10-01 DIAGNOSIS — Z96652 Presence of left artificial knee joint: Secondary | ICD-10-CM

## 2023-10-01 DIAGNOSIS — Z8546 Personal history of malignant neoplasm of prostate: Secondary | ICD-10-CM | POA: Insufficient documentation

## 2023-10-01 DIAGNOSIS — G8918 Other acute postprocedural pain: Secondary | ICD-10-CM | POA: Diagnosis not present

## 2023-10-01 DIAGNOSIS — Z79899 Other long term (current) drug therapy: Secondary | ICD-10-CM | POA: Insufficient documentation

## 2023-10-01 HISTORY — PX: TOTAL KNEE ARTHROPLASTY: SHX125

## 2023-10-01 HISTORY — DX: Presence of left artificial knee joint: Z96.652

## 2023-10-01 SURGERY — ARTHROPLASTY, KNEE, TOTAL
Anesthesia: Regional | Site: Knee | Laterality: Left

## 2023-10-01 MED ORDER — PROPOFOL 10 MG/ML IV BOLUS
INTRAVENOUS | Status: DC | PRN
  Administered 2023-10-01 (×2): 20 mg via INTRAVENOUS

## 2023-10-01 MED ORDER — FENTANYL CITRATE PF 50 MCG/ML IJ SOSY
25.0000 ug | PREFILLED_SYRINGE | INTRAMUSCULAR | Status: DC | PRN
  Administered 2023-10-01 (×2): 50 ug via INTRAVENOUS

## 2023-10-01 MED ORDER — FUROSEMIDE 20 MG PO TABS: 20.0000 mg | ORAL_TABLET | Freq: Every day | ORAL | Status: DC

## 2023-10-01 MED ORDER — PHENOL 1.4 % MT LIQD: 1.0000 | OROMUCOSAL | Status: DC | PRN

## 2023-10-01 MED ORDER — ONDANSETRON HCL 4 MG/2ML IJ SOLN
INTRAMUSCULAR | Status: DC | PRN
  Administered 2023-10-01: 4 mg via INTRAVENOUS

## 2023-10-01 MED ORDER — ACETAMINOPHEN 325 MG PO TABS
325.0000 mg | ORAL_TABLET | Freq: Four times a day (QID) | ORAL | Status: DC | PRN
  Administered 2023-10-03 – 2023-10-04 (×2): 650 mg via ORAL
  Filled 2023-10-01 (×2): qty 2

## 2023-10-01 MED ORDER — DEXAMETHASONE SODIUM PHOSPHATE 10 MG/ML IJ SOLN
INTRAMUSCULAR | Status: AC
  Filled 2023-10-01: qty 1

## 2023-10-01 MED ORDER — SODIUM CHLORIDE 0.9% FLUSH: 3.0000 mL | INTRAVENOUS | Status: DC | PRN

## 2023-10-01 MED ORDER — SODIUM CHLORIDE (PF) 0.9 % IJ SOLN
INTRAMUSCULAR | Status: DC | PRN
  Administered 2023-10-01: 80 mL

## 2023-10-01 MED ORDER — PHENYLEPHRINE HCL-NACL 20-0.9 MG/250ML-% IV SOLN
INTRAVENOUS | Status: AC
  Filled 2023-10-01: qty 750

## 2023-10-01 MED ORDER — ACETAMINOPHEN 10 MG/ML IV SOLN: 1000.0000 mg | Freq: Once | INTRAVENOUS | Status: DC | PRN

## 2023-10-01 MED ORDER — PROPOFOL 500 MG/50ML IV EMUL
INTRAVENOUS | Status: AC
  Filled 2023-10-01: qty 50

## 2023-10-01 MED ORDER — METOCLOPRAMIDE HCL 5 MG/ML IJ SOLN: 5.0000 mg | Freq: Three times a day (TID) | INTRAMUSCULAR | Status: DC | PRN

## 2023-10-01 MED ORDER — CEFAZOLIN SODIUM-DEXTROSE 2-4 GM/100ML-% IV SOLN
2.0000 g | Freq: Three times a day (TID) | INTRAVENOUS | Status: DC
  Administered 2023-10-01 – 2023-10-04 (×9): 2 g via INTRAVENOUS
  Filled 2023-10-01 (×9): qty 100

## 2023-10-01 MED ORDER — METHOCARBAMOL 500 MG PO TABS
ORAL_TABLET | ORAL | Status: AC
  Administered 2023-10-01: 500 mg via ORAL
  Filled 2023-10-01: qty 1

## 2023-10-01 MED ORDER — METHOCARBAMOL 1000 MG/10ML IJ SOLN: 500.0000 mg | Freq: Four times a day (QID) | INTRAMUSCULAR | Status: DC | PRN

## 2023-10-01 MED ORDER — METOPROLOL SUCCINATE ER 25 MG PO TB24
12.5000 mg | ORAL_TABLET | Freq: Every day | ORAL | Status: DC
  Administered 2023-10-02 – 2023-10-04 (×3): 12.5 mg via ORAL
  Filled 2023-10-01 (×3): qty 1

## 2023-10-01 MED ORDER — HYDROMORPHONE HCL 1 MG/ML IJ SOLN
0.5000 mg | INTRAMUSCULAR | Status: DC | PRN
  Administered 2023-10-02 (×2): 1 mg via INTRAVENOUS
  Administered 2023-10-02: 0.5 mg via INTRAVENOUS
  Filled 2023-10-01 (×3): qty 1

## 2023-10-01 MED ORDER — PROPOFOL 500 MG/50ML IV EMUL
INTRAVENOUS | Status: DC | PRN
  Administered 2023-10-01: 100 ug/kg/min via INTRAVENOUS

## 2023-10-01 MED ORDER — BUPIVACAINE IN DEXTROSE 0.75-8.25 % IT SOLN
INTRATHECAL | Status: DC | PRN
  Administered 2023-10-01: 12 mg via INTRATHECAL

## 2023-10-01 MED ORDER — SODIUM CHLORIDE 0.9% FLUSH
3.0000 mL | Freq: Two times a day (BID) | INTRAVENOUS | Status: DC
  Administered 2023-10-01: 3 mL via INTRAVENOUS
  Administered 2023-10-02: 5 mL via INTRAVENOUS
  Administered 2023-10-02: 3 mL via INTRAVENOUS
  Administered 2023-10-03 (×2): 10 mL via INTRAVENOUS

## 2023-10-01 MED ORDER — BISACODYL 10 MG RE SUPP: 10.0000 mg | Freq: Every day | RECTAL | Status: DC | PRN

## 2023-10-01 MED ORDER — METHOCARBAMOL 500 MG PO TABS
500.0000 mg | ORAL_TABLET | Freq: Four times a day (QID) | ORAL | Status: DC | PRN
  Administered 2023-10-01 – 2023-10-04 (×6): 500 mg via ORAL
  Filled 2023-10-01 (×6): qty 1

## 2023-10-01 MED ORDER — FENTANYL CITRATE PF 50 MCG/ML IJ SOSY
PREFILLED_SYRINGE | INTRAMUSCULAR | Status: AC
  Filled 2023-10-01: qty 1

## 2023-10-01 MED ORDER — CANNABIDIOL POWD: 1.0000 | Freq: Three times a day (TID) | Status: DC | PRN

## 2023-10-01 MED ORDER — MENTHOL 3 MG MT LOZG: 1.0000 | LOZENGE | OROMUCOSAL | Status: DC | PRN

## 2023-10-01 MED ORDER — ONDANSETRON HCL 4 MG/2ML IJ SOLN
4.0000 mg | Freq: Once | INTRAMUSCULAR | Status: AC | PRN
  Administered 2023-10-01: 4 mg via INTRAVENOUS

## 2023-10-01 MED ORDER — OXYCODONE HCL 5 MG PO TABS: 5.0000 mg | ORAL_TABLET | ORAL | 0 refills | Status: DC | PRN

## 2023-10-01 MED ORDER — ONDANSETRON HCL 4 MG/2ML IJ SOLN
INTRAMUSCULAR | Status: AC
  Filled 2023-10-01: qty 2

## 2023-10-01 MED ORDER — HYDROCHLOROTHIAZIDE 25 MG PO TABS
25.0000 mg | ORAL_TABLET | Freq: Every day | ORAL | Status: DC
  Administered 2023-10-02 – 2023-10-04 (×3): 25 mg via ORAL
  Filled 2023-10-01 (×3): qty 1

## 2023-10-01 MED ORDER — TRANEXAMIC ACID-NACL 1000-0.7 MG/100ML-% IV SOLN
1000.0000 mg | Freq: Once | INTRAVENOUS | Status: AC
  Administered 2023-10-01: 1000 mg via INTRAVENOUS
  Filled 2023-10-01: qty 100

## 2023-10-01 MED ORDER — TAMSULOSIN HCL 0.4 MG PO CAPS
0.4000 mg | ORAL_CAPSULE | Freq: Every day | ORAL | Status: DC
  Administered 2023-10-01 – 2023-10-03 (×3): 0.4 mg via ORAL
  Filled 2023-10-01 (×3): qty 1

## 2023-10-01 MED ORDER — OXYCODONE HCL 5 MG PO TABS
5.0000 mg | ORAL_TABLET | ORAL | Status: DC | PRN
  Administered 2023-10-01 (×2): 10 mg via ORAL
  Administered 2023-10-01: 5 mg via ORAL
  Administered 2023-10-02 – 2023-10-04 (×9): 10 mg via ORAL
  Administered 2023-10-04: 5 mg via ORAL
  Filled 2023-10-01 (×12): qty 2

## 2023-10-01 MED ORDER — WATER FOR IRRIGATION, STERILE IR SOLN
Status: DC | PRN
  Administered 2023-10-01: 1000 mL

## 2023-10-01 MED ORDER — SODIUM CHLORIDE (PF) 0.9 % IJ SOLN
INTRAMUSCULAR | Status: AC
  Filled 2023-10-01: qty 30

## 2023-10-01 MED ORDER — CHLORHEXIDINE GLUCONATE 0.12 % MT SOLN
15.0000 mL | Freq: Once | OROMUCOSAL | Status: AC
  Administered 2023-10-01: 15 mL via OROMUCOSAL

## 2023-10-01 MED ORDER — ROPIVACAINE HCL 5 MG/ML IJ SOLN
INTRAMUSCULAR | Status: DC | PRN
  Administered 2023-10-01: 30 mL via PERINEURAL

## 2023-10-01 MED ORDER — FENTANYL CITRATE PF 50 MCG/ML IJ SOSY
PREFILLED_SYRINGE | INTRAMUSCULAR | Status: AC
  Administered 2023-10-01: 50 ug via INTRAVENOUS
  Filled 2023-10-01: qty 2

## 2023-10-01 MED ORDER — CLONIDINE HCL (ANALGESIA) 100 MCG/ML EP SOLN
EPIDURAL | Status: DC | PRN
  Administered 2023-10-01: 100 ug

## 2023-10-01 MED ORDER — ONDANSETRON HCL 4 MG PO TABS: 4.0000 mg | ORAL_TABLET | Freq: Three times a day (TID) | ORAL | 1 refills | Status: DC | PRN

## 2023-10-01 MED ORDER — ACETAMINOPHEN 10 MG/ML IV SOLN
INTRAVENOUS | Status: AC
  Administered 2023-10-01: 1000 mg via INTRAVENOUS
  Filled 2023-10-01: qty 100

## 2023-10-01 MED ORDER — ASPIRIN 81 MG PO CHEW
81.0000 mg | CHEWABLE_TABLET | Freq: Two times a day (BID) | ORAL | Status: DC
  Administered 2023-10-01 – 2023-10-04 (×6): 81 mg via ORAL
  Filled 2023-10-01 (×6): qty 1

## 2023-10-01 MED ORDER — SODIUM CHLORIDE 0.9 % IR SOLN
Status: DC | PRN
  Administered 2023-10-01: 1000 mL

## 2023-10-01 MED ORDER — OXYCODONE HCL 5 MG PO TABS
ORAL_TABLET | ORAL | Status: AC
  Filled 2023-10-01: qty 1

## 2023-10-01 MED ORDER — DOCUSATE SODIUM 100 MG PO CAPS
100.0000 mg | ORAL_CAPSULE | Freq: Two times a day (BID) | ORAL | Status: DC
  Administered 2023-10-01 – 2023-10-04 (×6): 100 mg via ORAL
  Filled 2023-10-01 (×6): qty 1

## 2023-10-01 MED ORDER — CEFAZOLIN SODIUM-DEXTROSE 2-4 GM/100ML-% IV SOLN
2.0000 g | INTRAVENOUS | Status: AC
  Administered 2023-10-01: 2 g via INTRAVENOUS
  Filled 2023-10-01: qty 100

## 2023-10-01 MED ORDER — TRANEXAMIC ACID-NACL 1000-0.7 MG/100ML-% IV SOLN
1000.0000 mg | INTRAVENOUS | Status: AC
  Administered 2023-10-01: 1000 mg via INTRAVENOUS
  Filled 2023-10-01: qty 100

## 2023-10-01 MED ORDER — BUPIVACAINE-EPINEPHRINE (PF) 0.25% -1:200000 IJ SOLN
INTRAMUSCULAR | Status: AC
  Filled 2023-10-01: qty 30

## 2023-10-01 MED ORDER — METHOCARBAMOL 500 MG PO TABS: 500.0000 mg | ORAL_TABLET | Freq: Three times a day (TID) | ORAL | 1 refills | Status: DC | PRN

## 2023-10-01 MED ORDER — BUPIVACAINE LIPOSOME 1.3 % IJ SUSP
INTRAMUSCULAR | Status: AC
  Filled 2023-10-01: qty 20

## 2023-10-01 MED ORDER — 0.9 % SODIUM CHLORIDE (POUR BTL) OPTIME
TOPICAL | Status: DC | PRN
  Administered 2023-10-01: 1000 mL

## 2023-10-01 MED ORDER — MIDAZOLAM HCL 2 MG/2ML IJ SOLN
1.0000 mg | Freq: Once | INTRAMUSCULAR | Status: AC
  Administered 2023-10-01: 1 mg via INTRAVENOUS
  Filled 2023-10-01: qty 2

## 2023-10-01 MED ORDER — FENTANYL CITRATE PF 50 MCG/ML IJ SOSY
50.0000 ug | PREFILLED_SYRINGE | Freq: Once | INTRAMUSCULAR | Status: AC
  Administered 2023-10-01: 50 ug via INTRAVENOUS
  Filled 2023-10-01: qty 2

## 2023-10-01 MED ORDER — BUPIVACAINE LIPOSOME 1.3 % IJ SUSP: 20.0000 mL | Freq: Once | INTRAMUSCULAR | Status: DC

## 2023-10-01 MED ORDER — ORAL CARE MOUTH RINSE: 15.0000 mL | Freq: Once | OROMUCOSAL | Status: AC

## 2023-10-01 MED ORDER — LACTATED RINGERS IV SOLN: INTRAVENOUS | Status: DC

## 2023-10-01 MED ORDER — POLYETHYLENE GLYCOL 3350 17 G PO PACK: 17.0000 g | PACK | Freq: Every day | ORAL | Status: DC | PRN

## 2023-10-01 MED ORDER — METOCLOPRAMIDE HCL 5 MG PO TABS: 5.0000 mg | ORAL_TABLET | Freq: Three times a day (TID) | ORAL | Status: DC | PRN

## 2023-10-01 MED ORDER — POVIDONE-IODINE 10 % EX SWAB
2.0000 | Freq: Once | CUTANEOUS | Status: AC
  Administered 2023-10-01: 2 via TOPICAL

## 2023-10-01 MED ORDER — PROPOFOL 1000 MG/100ML IV EMUL
INTRAVENOUS | Status: AC
  Filled 2023-10-01: qty 100

## 2023-10-01 MED ORDER — ADULT MULTIVITAMIN W/MINERALS CH
1.0000 | ORAL_TABLET | Freq: Every day | ORAL | Status: DC
Start: 2023-10-02 — End: 2023-10-04
  Administered 2023-10-02 – 2023-10-04 (×3): 1 via ORAL
  Filled 2023-10-01 (×3): qty 1

## 2023-10-01 MED ORDER — IBUPROFEN 400 MG PO TABS: 400.0000 mg | ORAL_TABLET | Freq: Three times a day (TID) | ORAL | Status: DC | PRN

## 2023-10-01 MED ORDER — PHENYLEPHRINE HCL-NACL 20-0.9 MG/250ML-% IV SOLN
INTRAVENOUS | Status: DC | PRN
  Administered 2023-10-01: 30 ug/min via INTRAVENOUS

## 2023-10-01 MED ORDER — ASPIRIN EC 81 MG PO TBEC: 81.0000 mg | DELAYED_RELEASE_TABLET | Freq: Two times a day (BID) | ORAL | Status: AC

## 2023-10-01 MED ORDER — ROSUVASTATIN CALCIUM 20 MG PO TABS
20.0000 mg | ORAL_TABLET | Freq: Every day | ORAL | Status: DC
  Administered 2023-10-01 – 2023-10-04 (×4): 20 mg via ORAL
  Filled 2023-10-01 (×4): qty 1

## 2023-10-01 MED ORDER — IRBESARTAN 150 MG PO TABS
300.0000 mg | ORAL_TABLET | Freq: Every day | ORAL | Status: DC
  Administered 2023-10-02 – 2023-10-04 (×3): 300 mg via ORAL
  Filled 2023-10-01 (×3): qty 2

## 2023-10-01 MED ORDER — AMLODIPINE BESYLATE 10 MG PO TABS
10.0000 mg | ORAL_TABLET | Freq: Every day | ORAL | Status: DC
  Administered 2023-10-02 – 2023-10-04 (×3): 10 mg via ORAL
  Filled 2023-10-01 (×3): qty 1

## 2023-10-01 SURGICAL SUPPLY — 46 items
ATTUNE MED DOME PAT 41 KNEE (Knees) IMPLANT
ATTUNE PS FEM LT SZ 7 CEM KNEE (Femur) IMPLANT
ATTUNE PSRP INSR SZ7 5 KNEE (Insert) IMPLANT
BAG COUNTER SPONGE SURGICOUNT (BAG) IMPLANT
BAG ZIPLOCK 12X15 (MISCELLANEOUS) ×1 IMPLANT
BASE TIBIAL ROT PLAT SZ 7 KNEE (Knees) IMPLANT
BLADE SAG 18X100X1.27 (BLADE) ×1 IMPLANT
BLADE SAW SGTL 13X75X1.27 (BLADE) ×1 IMPLANT
BNDG ELASTIC 6X10 VLCR STRL LF (GAUZE/BANDAGES/DRESSINGS) ×1 IMPLANT
BNDG GAUZE DERMACEA FLUFF 4 (GAUZE/BANDAGES/DRESSINGS) ×1 IMPLANT
BOWL SMART MIX CTS (DISPOSABLE) ×1 IMPLANT
CEMENT HV SMART SET (Cement) ×2 IMPLANT
CLSR STERI-STRIP ANTIMIC 1/2X4 (GAUZE/BANDAGES/DRESSINGS) IMPLANT
COVER SURGICAL LIGHT HANDLE (MISCELLANEOUS) ×1 IMPLANT
CUFF TRNQT CYL 34X4.125X (TOURNIQUET CUFF) ×1 IMPLANT
DRAPE SHEET LG 3/4 BI-LAMINATE (DRAPES) ×1 IMPLANT
DRAPE U-SHAPE 47X51 STRL (DRAPES) ×1 IMPLANT
DRSG ADAPTIC 3X8 NADH LF (GAUZE/BANDAGES/DRESSINGS) ×1 IMPLANT
DURAPREP 26ML APPLICATOR (WOUND CARE) ×1 IMPLANT
ELECT PENCIL ROCKER SW 15FT (MISCELLANEOUS) ×1 IMPLANT
ELECT REM PT RETURN 15FT ADLT (MISCELLANEOUS) ×1 IMPLANT
GAUZE PAD ABD 8X10 STRL (GAUZE/BANDAGES/DRESSINGS) ×1 IMPLANT
GAUZE SPONGE 4X4 12PLY STRL (GAUZE/BANDAGES/DRESSINGS) ×1 IMPLANT
GLOVE BIOGEL PI IND STRL 7.5 (GLOVE) ×1 IMPLANT
GLOVE BIOGEL PI IND STRL 8.5 (GLOVE) ×1 IMPLANT
GLOVE ORTHO TXT STRL SZ7.5 (GLOVE) ×1 IMPLANT
GLOVE SURG ORTHO 8.5 STRL (GLOVE) ×1 IMPLANT
GOWN STRL REUS W/ TWL XL LVL3 (GOWN DISPOSABLE) ×2 IMPLANT
IMMOBILIZER KNEE 20 (SOFTGOODS) ×1 IMPLANT
IMMOBILIZER KNEE 20 THIGH 36 (SOFTGOODS) ×1 IMPLANT
KIT TURNOVER KIT A (KITS) ×1 IMPLANT
MANIFOLD NEPTUNE II (INSTRUMENTS) ×1 IMPLANT
NS IRRIG 1000ML POUR BTL (IV SOLUTION) ×1 IMPLANT
PACK TOTAL KNEE CUSTOM (KITS) ×1 IMPLANT
PIN STEINMAN FIXATION KNEE (PIN) IMPLANT
PROTECTOR NERVE ULNAR (MISCELLANEOUS) ×1 IMPLANT
SET HNDPC FAN SPRY TIP SCT (DISPOSABLE) ×1 IMPLANT
STRIP CLOSURE SKIN 1/2X4 (GAUZE/BANDAGES/DRESSINGS) ×2 IMPLANT
SUT MNCRL AB 3-0 PS2 18 (SUTURE) ×1 IMPLANT
SUT VIC AB 0 CT1 36 (SUTURE) ×1 IMPLANT
SUT VIC AB 1 CT1 36 (SUTURE) ×2 IMPLANT
SUT VIC AB 2-0 CT1 TAPERPNT 27 (SUTURE) ×1 IMPLANT
TOWEL GREEN STERILE FF (TOWEL DISPOSABLE) ×1 IMPLANT
TRAY CATH INTERMITTENT SS 16FR (CATHETERS) ×1 IMPLANT
WATER STERILE IRR 1000ML POUR (IV SOLUTION) ×2 IMPLANT
YANKAUER SUCT BULB TIP NO VENT (SUCTIONS) ×1 IMPLANT

## 2023-10-01 NOTE — Interval H&P Note (Signed)
 History and Physical Interval Note:  10/01/2023 7:31 AM  Chase Combs  has presented today for surgery, with the diagnosis of Left knee osteoarthritis.  The various methods of treatment have been discussed with the patient and family. After consideration of risks, benefits and other options for treatment, the patient has consented to  Procedure(s): ARTHROPLASTY, KNEE, TOTAL (Left) as a surgical intervention.  The patient's history has been reviewed, patient examined, no change in status, stable for surgery.  I have reviewed the patient's chart and labs.  Questions were answered to the patient's satisfaction.     Lorriane Rote

## 2023-10-01 NOTE — Anesthesia Procedure Notes (Signed)
 Spinal  Patient location during procedure: OR Start time: 10/01/2023 9:22 AM End time: 10/01/2023 9:24 AM Reason for block: surgical anesthesia Staffing Performed: resident/CRNA  Anesthesiologist: [name], MD Resident/CRNA: Micky Albee, CRNA Performed by: Micky Albee, CRNA Authorized by: [name], MD   Preanesthetic Checklist Completed: patient identified, IV checked, site marked, risks and benefits discussed, surgical consent, monitors and equipment checked, pre-op evaluation and timeout performed Spinal Block Patient position: sitting Prep: DuraPrep Patient monitoring: heart rate, cardiac monitor, continuous pulse ox and blood pressure Approach: midline Location: L3-4 Needle Needle type: Pencan  Needle gauge: 24 G Needle length: 10 cm Needle insertion depth: 7 cm Assessment Sensory level: T6 Events: CSF return Additional Notes TImeout performed. Patient in sitting position. L3-4 identified. Cleansed with duraprep. SAB without difficulty. To supine positon.

## 2023-10-01 NOTE — Anesthesia Postprocedure Evaluation (Signed)
 Anesthesia Post Note  Patient: Chase Combs  Procedure(s) Performed: ARTHROPLASTY, KNEE, TOTAL (Left: Knee)     Patient location during evaluation: Nursing Unit Anesthesia Type: Regional and Spinal Level of consciousness: oriented and awake and alert Pain management: pain level controlled Vital Signs Assessment: post-procedure vital signs reviewed and stable Respiratory status: spontaneous breathing and respiratory function stable Cardiovascular status: blood pressure returned to baseline and stable Postop Assessment: no headache, no backache, no apparent nausea or vomiting and patient able to bend at knees Anesthetic complications: no  No notable events documented.  Last Vitals:  Vitals:   10/01/23 1145 10/01/23 1148  BP: 105/72   Pulse: (!) 58 (!) 59  Resp: 16 18  Temp: 36.6 C   SpO2: 94% 97%    Last Pain:  Vitals:   10/01/23 1148  TempSrc:   PainSc: 7     LLE Motor Response: Purposeful movement (10/01/23 1145) LLE Sensation: Decreased;Numbness (10/01/23 1145) RLE Motor Response: Purposeful movement (10/01/23 1145) RLE Sensation: Numbness;Decreased (10/01/23 1145) L Sensory Level: L3-Anterior knee, lower leg (10/01/23 1145) R Sensory Level: L3-Anterior knee, lower leg (10/01/23 1145)  [name]

## 2023-10-01 NOTE — Evaluation (Signed)
 Physical Therapy Evaluation Patient Details Name: Chase Combs MRN: [id number] DOB: Oct 10, 1945 Today's Date: 10/01/2023  History of Present Illness  Pt s/p L TKR and with hx of CAD, STEMI, lumbar back surgery, and pre-diabetes  Clinical Impression  Pt s/p L TKR and presents with decreased L LE strength/ROM; post op pain and nausea: and severe back pain limiting functional mobility.  Pt requesting to mobilize from post op gurney 2* unbearable back pain.  Pt requiring significant assist but assist to EOB, to standing and to step pvt with RW to recliner.  Pt states some relief to back pain but still significant and becoming more nauseous - then proceeded to vomit.  RN present.  Spouse also present and reports plan for first OP PT scheduled 10/04/23.      If plan is discharge home, recommend the following: A lot of help with walking and/or transfers;A lot of help with bathing/dressing/bathroom;Assistance with cooking/housework;Assist for transportation;Help with stairs or ramp for entrance   Can travel by private vehicle        Equipment Recommendations None recommended by PT  Recommendations for Other Services       Functional Status Assessment Patient has had a recent decline in their functional status and demonstrates the ability to make significant improvements in function in a reasonable and predictable amount of time.     Precautions / Restrictions Precautions Precautions: Knee;Fall Required Braces or Orthoses: Knee Immobilizer - Left Knee Immobilizer - Left: Discontinue once straight leg raise with < 10 degree lag Restrictions Weight Bearing Restrictions Per Provider Order: Yes RLE Weight Bearing Per Provider Order: Weight bearing as tolerated      Mobility  Bed Mobility Overal bed mobility: Needs Assistance Bed Mobility: Supine to Sit     Supine to sit: Min assist, +2 for physical assistance, +2 for safety/equipment     General bed mobility comments: Assist to manage L  LE and to bring trunk to upright    Transfers Overall transfer level: Needs assistance Equipment used: Rolling walker (2 wheels) Transfers: Sit to/from Stand, Bed to chair/wheelchair/BSC Sit to Stand: Min assist, +2 physical assistance, +2 safety/equipment, From elevated surface   Step pivot transfers: Min assist, Mod assist, +2 safety/equipment, From elevated surface       General transfer comment: cues for LE management and use of UEs to self assist;  Step pvt bed to recliner    Ambulation/Gait               General Gait Details: Step pvt bed to recliner only 2* pain, nausea and low BP  Stairs            Wheelchair Mobility     Tilt Bed    Modified Rankin (Stroke Patients Only)       Balance Overall balance assessment: Needs assistance Sitting-balance support: No upper extremity supported, Feet supported Sitting balance-Leahy Scale: Fair     Standing balance support: Bilateral upper extremity supported Standing balance-Leahy Scale: Poor                               Pertinent Vitals/Pain Pain Assessment Pain Assessment: 0-10 Pain Score: 10-Worst pain ever Pain Location: Back and L knee Pain Descriptors / Indicators: Aching, Grimacing, Guarding, Sore, Throbbing Pain Intervention(s): Limited activity within patient's tolerance, Monitored during session, Premedicated before session, Repositioned    Home Living Family/patient expects to be discharged to:: Private residence Living Arrangements: Spouse/significant other Available  Help at Discharge: Family;Available 24 hours/day Type of Home: House Home Access: Ramped entrance       Home Layout: One level Home Equipment: Agricultural consultant (2 wheels);Rollator (4 wheels);Cane - single point (youth level RW but pt insists its high enough)      Prior Function Prior Level of Function : Independent/Modified Independent             Mobility Comments: cane or walker as needed        Extremity/Trunk Assessment   Upper Extremity Assessment Upper Extremity Assessment: Overall WFL for tasks assessed    Lower Extremity Assessment Lower Extremity Assessment: LLE deficits/detail LLE: Unable to fully assess due to pain       Communication   Communication Communication: Impaired Factors Affecting Communication: Hearing impaired    Cognition Arousal: Alert Behavior During Therapy: WFL for tasks assessed/performed, Impulsive   PT - Cognitive impairments: No apparent impairments                         Following commands: Intact       Cueing Cueing Techniques: Verbal cues, Gestural cues, Tactile cues     General Comments      Exercises     Assessment/Plan    PT Assessment Patient needs continued PT services  PT Problem List Decreased strength;Decreased range of motion;Decreased activity tolerance;Decreased balance;Decreased mobility;Decreased knowledge of use of DME;Pain;Decreased safety awareness       PT Treatment Interventions DME instruction;Gait training;Functional mobility training;Therapeutic activities;Therapeutic exercise;Patient/family education    PT Goals (Current goals can be found in the Care Plan section)  Acute Rehab PT Goals Patient Stated Goal: Less pain PT Goal Formulation: With patient Time For Goal Achievement: 10/15/23 Potential to Achieve Goals: Good    Frequency 7X/week     Co-evaluation               AM-PAC PT "6 Clicks" Mobility  Outcome Measure Help needed turning from your back to your side while in a flat bed without using bedrails?: A Lot Help needed moving from lying on your back to sitting on the side of a flat bed without using bedrails?: A Lot Help needed moving to and from a bed to a chair (including a wheelchair)?: A Lot Help needed standing up from a chair using your arms (e.g., wheelchair or bedside chair)?: A Lot Help needed to walk in hospital room?: Total Help needed climbing 3-5  steps with a railing? : Total 6 Click Score: 10    End of Session Equipment Utilized During Treatment: Gait belt;Left knee immobilizer Activity Tolerance: Patient limited by pain;Other (comment) (nausea) Patient left: in chair;with call bell/phone within reach;with family/visitor present;with nursing/sitter in room Nurse Communication: Mobility status PT Visit Diagnosis: Difficulty in walking, not elsewhere classified (R26.2);Pain Pain - Right/Left: Left Pain - part of body: Knee (and back)    Time: 1610-9604 PT Time Calculation (min) (ACUTE ONLY): 14 min   Charges:   PT Evaluation $PT Eval Low Complexity: 1 Low   PT General Charges $$ ACUTE PT VISIT: 1 Visit         Thedora Finlay PT Acute Rehabilitation Services Pager [phone] Office [phone]   Tanette Chauca 10/01/2023, 2:42 PM

## 2023-10-01 NOTE — Transfer of Care (Signed)
 Immediate Anesthesia Transfer of Care Note  Patient: Chase Combs  Procedure(s) Performed: ARTHROPLASTY, KNEE, TOTAL (Left: Knee)  Patient Location: PACU  Anesthesia Type:Spinal  Level of Consciousness: sedated  Airway & Oxygen Therapy: Patient Spontanous Breathing and Patient connected to face mask oxygen  Post-op Assessment: Report given to RN and Post -op Vital signs reviewed and stable  Post vital signs: Reviewed and stable  Last Vitals:  Vitals Value Taken Time  BP 110/62 10/01/23 1116  Temp    Pulse 68 10/01/23 1117  Resp 20 10/01/23 1117  SpO2 96 % 10/01/23 1117  Vitals shown include unfiled device data.  Last Pain:  Vitals:   10/01/23 0815  TempSrc:   PainSc: 0-No pain         Complications: No notable events documented.

## 2023-10-01 NOTE — Brief Op Note (Signed)
 10/01/2023  11:11 AM  PATIENT:  Chase Combs  78 y.o. male  PRE-OPERATIVE DIAGNOSIS:  Left knee osteoarthritis, end stage  POST-OPERATIVE DIAGNOSIS:  Left knee osteoarthritis, end stage  PROCEDURE:  Procedure(s): ARTHROPLASTY, KNEE, TOTAL (Left)  DePuy Attune  SURGEON:  Surgeons and Role:    Winston Hawking, MD - Primary  PHYSICIAN ASSISTANT:   ASSISTANTS: Pandora Bogaert Dixon,PA-C   ANESTHESIA:   regional and spinal  EBL:  minimal   BLOOD ADMINISTERED:none  DRAINS: none   LOCAL MEDICATIONS USED:  MARCAINE     SPECIMEN:  No Specimen  DISPOSITION OF SPECIMEN:  N/A  COUNTS:  YES  TOURNIQUET:   Total Tourniquet Time Documented: Thigh (Left) - 90 minutes Total: Thigh (Left) - 90 minutes   DICTATION: .Other Dictation: Dictation Number [id number]  PLAN OF CARE: Discharge to home after PACU  PATIENT DISPOSITION:  PACU - hemodynamically stable.   Delay start of Pharmacological VTE agent (>24hrs) due to surgical blood loss or risk of bleeding: no

## 2023-10-01 NOTE — Care Plan (Signed)
 Ortho Bundle Case Management Note  Patient Details  Name: Chase Combs MRN: [id number] Date of Birth: [birth year]  L TKA on 10/01/23  DCP: Home with wife  DME: No needs. Has RW PT: EO 5/12                  DME Arranged:  N/A DME Agency:  NA  HH Arranged:    HH Agency:     Additional Comments: Please contact me with any questions of if this plan should need to change.  Bronwen Canon, CCM [facility] [phone]   10/01/2023, 8:04 AM

## 2023-10-01 NOTE — Discharge Instructions (Signed)
 Ice to the knee constantly.  Keep the incision covered and clean and dry for one week, then ok to get it wet in the shower.  Do exercise as instructed every hour, please to prevent stiffness.    DO NOT prop anything under the knee, it will make your knee stiff.  Prop under the ankle to encourage your knee to go straight.   Use the walker while you are up and around for balance.  Wear your support stockings 24/7 to prevent blood clots and take baby aspirin  twice daily for 30 days also to prevent blood clots  Start back on your Plavix  tomorrow (10/02/23)  Follow up with Dr Brunilda Capra in two weeks in the office, call [phone] for appt  Please call Dr Brunilda Capra (cell) [phone] with any questions or concerns  INSTRUCTIONS AFTER JOINT REPLACEMENT   Remove items at home which could result in a fall. This includes throw rugs or furniture in walking pathways ICE to the affected joint every three hours while awake for 30 minutes at a time, for at least the first 3-5 days, and then as needed for pain and swelling.  Continue to use ice for pain and swelling. You may notice swelling that will progress down to the foot and ankle.  This is normal after surgery.  Elevate your leg when you are not up walking on it.   Continue to use the breathing machine you got in the hospital (incentive spirometer) which will help keep your temperature down.  It is common for your temperature to cycle up and down following surgery, especially at night when you are not up moving around and exerting yourself.  The breathing machine keeps your lungs expanded and your temperature down.   DIET:  As you were doing prior to hospitalization, we recommend a well-balanced diet.  DRESSING / WOUND CARE / SHOWERING  Please keep your Aquacel dressing in place for one week then remove and leave the incision open to air. OK to get the incision wet at that point  ACTIVITY  Increase activity slowly as tolerated, but follow the weight  bearing instructions below.   No driving for 6 weeks or until further direction given by your physician.  You cannot drive while taking narcotics.  No lifting or carrying greater than 10 lbs. until further directed by your surgeon. Avoid periods of inactivity such as sitting longer than an hour when not asleep. This helps prevent blood clots.  You may return to work once you are authorized by your doctor.     WEIGHT BEARING   Weight bearing as tolerated with assist device (walker, cane, etc) as directed, use it as long as suggested by your surgeon or therapist, typically at least 4-6 weeks.   EXERCISES  Results after joint replacement surgery are often greatly improved when you follow the exercise, range of motion and muscle strengthening exercises prescribed by your doctor. Safety measures are also important to protect the joint from further injury. Any time any of these exercises cause you to have increased pain or swelling, decrease what you are doing until you are comfortable again and then slowly increase them. If you have problems or questions, call your caregiver or physical therapist for advice.   Rehabilitation is important following a joint replacement. After just a few days of immobilization, the muscles of the leg can become weakened and shrink (atrophy).  These exercises are designed to build up the tone and strength of the thigh and leg muscles  and to improve motion. Often times heat used for twenty to thirty minutes before working out will loosen up your tissues and help with improving the range of motion but do not use heat for the first two weeks following surgery (sometimes heat can increase post-operative swelling).   These exercises can be done on a training (exercise) mat, on the floor, on a table or on a bed. Use whatever works the best and is most comfortable for you.    Use music or television while you are exercising so that the exercises are a pleasant break in your day.  This will make your life better with the exercises acting as a break in your routine that you can look forward to.   Perform all exercises about fifteen times, three times per day or as directed.  You should exercise both the operative leg and the other leg as well.  Exercises include:   Quad Sets - Tighten up the muscle on the front of the thigh (Quad) and hold for 5-10 seconds.   Straight Leg Raises - With your knee straight (if you were given a brace, keep it on), lift the leg to 60 degrees, hold for 3 seconds, and slowly lower the leg.  Perform this exercise against resistance later as your leg gets stronger.  Leg Slides: Lying on your back, slowly slide your foot toward your buttocks, bending your knee up off the floor (only go as far as is comfortable). Then slowly slide your foot back down until your leg is flat on the floor again.  Angel Wings: Lying on your back spread your legs to the side as far apart as you can without causing discomfort.  Hamstring Strength:  Lying on your back, push your heel against the floor with your leg straight by tightening up the muscles of your buttocks.  Repeat, but this time bend your knee to a comfortable angle, and push your heel against the floor.  You may put a pillow under the heel to make it more comfortable if necessary.   A rehabilitation program following joint replacement surgery can speed recovery and prevent re-injury in the future due to weakened muscles. Contact your doctor or a physical therapist for more information on knee rehabilitation.    CONSTIPATION  Constipation is defined medically as fewer than three stools per week and severe constipation as less than one stool per week.  Even if you have a regular bowel pattern at home, your normal regimen is likely to be disrupted due to multiple reasons following surgery.  Combination of anesthesia, postoperative narcotics, change in appetite and fluid intake all can affect your bowels.   YOU MUST  use at least one of the following options; they are listed in order of increasing strength to get the job done.  They are all available over the counter, and you may need to use some, POSSIBLY even all of these options:    Drink plenty of fluids (prune juice may be helpful) and high fiber foods Colace 100 mg by mouth twice a day  Senokot for constipation as directed and as needed Dulcolax (bisacodyl), take with full glass of water  Miralax (polyethylene glycol) once or twice a day as needed.  If you have tried all these things and are unable to have a bowel movement in the first 3-4 days after surgery call either your surgeon or your primary doctor.    If you experience loose stools or diarrhea, hold the medications until you stool  forms back up.  If your symptoms do not get better within 1 week or if they get worse, check with your doctor.  If you experience "the worst abdominal pain ever" or develop nausea or vomiting, please contact the office immediately for further recommendations for treatment.   ITCHING:  If you experience itching with your medications, try taking only a single pain pill, or even half a pain pill at a time.  You can also use Benadryl over the counter for itching or also to help with sleep.   TED HOSE STOCKINGS:  Use stockings on both legs until for at least 2 weeks or as directed by physician office. They may be removed at night for sleeping.  MEDICATIONS:  See your medication summary on the "After Visit Summary" that nursing will review with you.  You may have some home medications which will be placed on hold until you complete the course of blood thinner medication.  It is important for you to complete the blood thinner medication as prescribed.  PRECAUTIONS:  If you experience chest pain or shortness of breath - call 911 immediately for transfer to the hospital emergency department.   If you develop a fever greater that 101 F, purulent drainage from wound, increased  redness or drainage from wound, foul odor from the wound/dressing, or calf pain - CONTACT YOUR SURGEON.                                                   FOLLOW-UP APPOINTMENTS:  If you do not already have a post-op appointment, please call the office for an appointment to be seen by your surgeon.  Guidelines for how soon to be seen are listed in your "After Visit Summary", but are typically between 1-4 weeks after surgery.  OTHER INSTRUCTIONS:   Knee Replacement:  Do not place pillow under knee, focus on keeping the knee straight while resting. CPM instructions: 0-90 degrees, 2 hours in the morning, 2 hours in the afternoon, and 2 hours in the evening. Place foam block, curve side up under heel at all times except when in CPM or when walking.  DO NOT modify, tear, cut, or change the foam block in any way.  POST-OPERATIVE OPIOID TAPER INSTRUCTIONS: It is important to wean off of your opioid medication as soon as possible. If you do not need pain medication after your surgery it is ok to stop day one. Opioids include: Codeine, Hydrocodone(Norco, Vicodin), Oxycodone(Percocet, oxycontin) and hydromorphone amongst others.  Long term and even short term use of opiods can cause: Increased pain response Dependence Constipation Depression Respiratory depression And more.  Withdrawal symptoms can include Flu like symptoms Nausea, vomiting And more Techniques to manage these symptoms Hydrate well Eat regular healthy meals Stay active Use relaxation techniques(deep breathing, meditating, yoga) Do Not substitute Alcohol to help with tapering If you have been on opioids for less than two weeks and do not have pain than it is ok to stop all together.  Plan to wean off of opioids This plan should start within one week post op of your joint replacement. Maintain the same interval or time between taking each dose and first decrease the dose.  Cut the total daily intake of opioids by one tablet each  day Next start to increase the time between doses. The last dose that should be  eliminated is the evening dose.   MAKE SURE YOU:  Understand these instructions.  Get help right away if you are not doing well or get worse.    Thank you for letting us  be a part of your medical care team.  It is a privilege we respect greatly.  We hope these instructions will help you stay on track for a fast and full recovery!

## 2023-10-01 NOTE — Plan of Care (Signed)
   Problem: Education: Goal: Knowledge of General Education information will improve Description Including pain rating scale, medication(s)/side effects and non-pharmacologic comfort measures Outcome: Progressing

## 2023-10-01 NOTE — Anesthesia Procedure Notes (Addendum)
 Anesthesia Regional Block: Adductor canal block   Pre-Anesthetic Checklist: , timeout performed,  Correct Patient, Correct Site, Correct Laterality,  Correct Procedure, Correct Position, site marked,  Risks and benefits discussed,  Surgical consent,  Pre-op evaluation,  At surgeon's request and post-op pain management  Laterality: Lower and Left  Prep: chloraprep       Needles:  Injection technique: Single-shot  Needle Type: Echogenic Needle     Needle Length: 9cm  Needle Gauge: 22     Additional Needles:   Procedures:,,,, ultrasound used (permanent image in chart),,    Narrative:  Start time: 10/01/2023 8:09 AM End time: 10/01/2023 8:14 AM Injection made incrementally with aspirations every 5 mL.  Performed by: Personally  Anesthesiologist: [name], MD  Additional Notes: Block assessed prior to surgery. Pt tolerated procedure well.

## 2023-10-01 NOTE — Progress Notes (Signed)
 Orthopedic Tech Progress Note Patient Details:  Chase Combs 1945-07-22 [id number]  Ortho Devices Type of Ortho Device: Bone foam zero knee Ortho Device/Splint Location: LLE Ortho Device/Splint Interventions: Application   Post Interventions Patient Tolerated: Well  Chase Combs 10/01/2023, 12:34 PM

## 2023-10-02 ENCOUNTER — Other Ambulatory Visit: Payer: Self-pay

## 2023-10-02 DIAGNOSIS — I2510 Atherosclerotic heart disease of native coronary artery without angina pectoris: Secondary | ICD-10-CM | POA: Diagnosis not present

## 2023-10-02 DIAGNOSIS — I10 Essential (primary) hypertension: Secondary | ICD-10-CM | POA: Diagnosis not present

## 2023-10-02 DIAGNOSIS — Z79899 Other long term (current) drug therapy: Secondary | ICD-10-CM | POA: Diagnosis not present

## 2023-10-02 DIAGNOSIS — Z8616 Personal history of COVID-19: Secondary | ICD-10-CM | POA: Diagnosis not present

## 2023-10-02 DIAGNOSIS — Z955 Presence of coronary angioplasty implant and graft: Secondary | ICD-10-CM | POA: Diagnosis not present

## 2023-10-02 DIAGNOSIS — Z7982 Long term (current) use of aspirin: Secondary | ICD-10-CM | POA: Diagnosis not present

## 2023-10-02 DIAGNOSIS — M1712 Unilateral primary osteoarthritis, left knee: Secondary | ICD-10-CM | POA: Diagnosis not present

## 2023-10-02 NOTE — Progress Notes (Signed)
    Subjective:  Patient reports pain as mild to moderate.  Currently denies N/V/CP/SOB/Abd pain. He had some N/V PO yesterday. Denies tingling in LE bilaterally. Reports pain is doing okay.   Objective:   VITALS:   Vitals:   10/01/23 1710 10/01/23 2241 10/02/23 0108 10/02/23 0543  BP: 121/81 119/67 137/82 120/73  Pulse: 60 74 87 86  Resp: 16 16 16 16   Temp:  98.4 F (36.9 C) 99 F (37.2 C) 97.7 F (36.5 C)  TempSrc:      SpO2: 94% 97% 98% 93%  Weight:      Height:        NAD Neurologically intact ABD soft Neurovascular intact Sensation intact distally Intact pulses distally Dorsiflexion/Plantar flexion intact Incision: dressing C/D/I No cellulitis present Compartment soft Aquacel dressing placed over incision.   Lab Results  Component Value Date   WBC 6.0 09/22/2023   HGB 13.0 09/22/2023   HCT 40.0 09/22/2023   MCV 93.7 09/22/2023   PLT 271 09/22/2023   BMET    Component Value Date/Time   NA 140 09/22/2023 0846   NA 142 06/06/2021 1010   K 3.9 09/22/2023 0846   CL 103 09/22/2023 0846   CO2 27 09/22/2023 0846   GLUCOSE 150 (H) 09/22/2023 0846   BUN 14 09/22/2023 0846   BUN 16 06/06/2021 1010   CREATININE 0.60 (L) 09/22/2023 0846   CREATININE 0.65 06/28/2014 0824   CALCIUM  9.4 09/22/2023 0846   EGFR 95 06/06/2021 1010   GFRNONAA >60 09/22/2023 0846     Assessment/Plan: 1 Day Post-Op   Principal Problem:   Status post total knee replacement, left   WBAT with walker DVT ppx: Aspirin , SCDs, TEDS PO pain control PT/OT: N/V with PT yesterday. Continue PT today.  Dispo:  - D/c home once cleared with PT. - Nurse to place TED stockings on LE bilaterally prior to discharge home.    Harman Lightning 10/02/2023, 8:52 AM   [facility]  [place] [address], [place], [place] [address] Phone: [phone] [email or web address] Facebook  [employer]

## 2023-10-02 NOTE — Progress Notes (Signed)
 PT Cancellation Note  Patient Details Name: Chase Combs MRN: [id number] DOB: 31-Jan-1946   Cancelled Treatment:    Reason Eval/Treat Not Completed: (P) Patient declined, no reason specified: pt electing to nap in recliner, reporting he didn't sleep well last night and would like to rest. We will continue to follow acutely.  Jerrye Mori, PT, DPT [facility] Office: [phone] Jerrye Mori 10/02/2023, 5:19 PM

## 2023-10-02 NOTE — Progress Notes (Signed)
 Physical Therapy Treatment Patient Details Name: Chase Combs MRN: [id number] DOB: [birth year] Today's Date: 10/02/2023   History of Present Illness Pt s/p L TKR and with hx of CAD, STEMI, lumbar back surgery, and pre-diabetes    PT Comments  Chase Combs is a 78 y.o. male POD1 s/p L-TKR, attempted SDDC but limited by nausea/pain/vomiting and requirement of supplemental O2. Patient is now limited by functional impairments (see PT problem list below) and requires minA for bed mobility and for transfers. Patient was able to ambulate 30 feet with RW and CGA+2 level of assist. Patient instructed in exercise to facilitate ROM and circulation to manage edema. Provided incentive spirometer and with Vcs pt able to achieve . Pt's SpO2 at rest on 2LO2viaNC 97% with HR94; post-ambulation SpO2 83% (2L), recovered quickly with cues for pursed lip breathing to 92%. Pt did not require oxygen prior to admission. Communicated oxygen requirement to nurse. Pt adamant he is ready to discharge, provided education that medical team will need to address oxygen requirement. PT will continue to follow acutely and if pt remains acutely will attempt second session.    If plan is discharge home, recommend the following: A lot of help with walking and/or transfers;A lot of help with bathing/dressing/bathroom;Assistance with cooking/housework;Assist for transportation;Help with stairs or ramp for entrance   Can travel by private vehicle        Equipment Recommendations  None recommended by PT    Recommendations for Other Services       Precautions / Restrictions Precautions Precautions: Knee;Fall Required Braces or Orthoses: Knee Immobilizer - Left Knee Immobilizer - Left: Discontinue once straight leg raise with < 10 degree lag Restrictions Weight Bearing Restrictions Per Provider Order: Yes RLE Weight Bearing Per Provider Order: Weight bearing as tolerated LLE Weight Bearing Per Provider Order: Weight  bearing as tolerated     Mobility  Bed Mobility Overal bed mobility: Needs Assistance Bed Mobility: Supine to Sit     Supine to sit: Min assist     General bed mobility comments: Assist to manage L LE and to bring trunk to upright    Transfers Overall transfer level: Needs assistance Equipment used: Rolling walker (2 wheels) Transfers: Sit to/from Stand, Bed to chair/wheelchair/BSC Sit to Stand: Min assist           General transfer comment: cues for LE management and use of UEs to self assist; min assist for lift assistance    Ambulation/Gait Ambulation/Gait assistance: Contact guard assist, +2 safety/equipment Gait Distance (Feet): 30 Feet Assistive device: Rolling walker (2 wheels) Gait Pattern/deviations: Step-to pattern, Antalgic, Trunk flexed, Decreased stance time - left Gait velocity: decreased     General Gait Details: Pt ambulated with RW and CGA with +2 for close recliner follow for safety and managment of O2 tank, 46ft with 1-180deg turn and return to recliner, antalgic gait pattern with standing recovery period provided; pt utilizing youth RW but stating "It's fine we will make it work" and declining PT recommending to switch out.   Stairs             Wheelchair Mobility     Tilt Bed    Modified Rankin (Stroke Patients Only)       Balance Overall balance assessment: Needs assistance Sitting-balance support: No upper extremity supported, Feet supported Sitting balance-Leahy Scale: Fair     Standing balance support: Bilateral upper extremity supported Standing balance-Leahy Scale: Poor  Communication Communication Communication: Impaired Factors Affecting Communication: Hearing impaired  Cognition Arousal: Alert Behavior During Therapy: WFL for tasks assessed/performed, Impulsive   PT - Cognitive impairments: No apparent impairments                         Following commands:  Intact      Cueing Cueing Techniques: Verbal cues, Gestural cues, Tactile cues  Exercises Total Joint Exercises Ankle Circles/Pumps: AROM, Both, 10 reps Quad Sets: AROM, Left, 5 reps Heel Slides: Left, 5 reps, AAROM Hip ABduction/ADduction: AAROM, Left, 5 reps    General Comments        Pertinent Vitals/Pain Pain Assessment Pain Assessment: 0-10 Pain Score: 10-Worst pain ever Pain Location: Back and L knee Pain Descriptors / Indicators: Aching, Grimacing, Guarding, Sore, Throbbing Pain Intervention(s): Monitored during session, Repositioned, Patient requesting pain meds-RN notified, Ice applied    Home Living                          Prior Function            PT Goals (current goals can now be found in the care plan section) Acute Rehab PT Goals Patient Stated Goal: Less pain PT Goal Formulation: With patient Time For Goal Achievement: 10/15/23 Potential to Achieve Goals: Good Progress towards PT goals: Progressing toward goals    Frequency    7X/week      PT Plan      Co-evaluation              AM-PAC PT "6 Clicks" Mobility   Outcome Measure  Help needed turning from your back to your side while in a flat bed without using bedrails?: A Little Help needed moving from lying on your back to sitting on the side of a flat bed without using bedrails?: A Little Help needed moving to and from a bed to a chair (including a wheelchair)?: A Little Help needed standing up from a chair using your arms (e.g., wheelchair or bedside chair)?: A Little Help needed to walk in hospital room?: A Little Help needed climbing 3-5 steps with a railing? : Total 6 Click Score: 16    End of Session Equipment Utilized During Treatment: Gait belt;Left knee immobilizer Activity Tolerance: Patient limited by pain;Other (comment) (nausea) Patient left: in chair;with call bell/phone within reach;with nursing/sitter in room;with chair alarm set Nurse Communication:  Mobility status PT Visit Diagnosis: Difficulty in walking, not elsewhere classified (R26.2);Pain Pain - Right/Left: Left Pain - part of body: Knee (and back)     Time: 8295-6213 PT Time Calculation (min) (ACUTE ONLY): 28 min  Charges:    $Gait Training: 8-22 mins $Therapeutic Exercise: 8-22 mins PT General Charges $$ ACUTE PT VISIT: 1 Visit                     Jerrye Mori, PT, DPT [facility] Office: [phone]   Jerrye Mori 10/02/2023, 10:37 AM

## 2023-10-02 NOTE — Op Note (Unsigned)
 NAME: Chase Combs, Chase Combs MEDICAL RECORD NO: [id number] ACCOUNT NO: [id number] DATE OF BIRTH: [birth year] FACILITY: [facility]ATION: [id number] PHYSICIAN: Loreta Rome. Brunilda Capra, MD  Operative Report   DATE OF PROCEDURE: 10/01/2023  PREOPERATIVE DIAGNOSIS:  Left knee end-stage arthritis.  POSTOPERATIVE DIAGNOSIS:  Left knee end-stage arthritis.  PROCEDURE PERFORMED:  Left total knee arthroplasty using DePuy Attune prosthesis.  ATTENDING SURGEON:  Loreta Rome. Brunilda Capra, MD  ASSISTANT:  Barton Like Dixon, [place], who was scrubbed during the entire procedure, and necessary for satisfactory completion of surgery.  ANESTHESIA:  Spinal anesthesia plus adductor canal block.  ESTIMATED BLOOD LOSS:  Minimal.  FLUID REPLACEMENT:  1000 mL of crystalloid.  INSTRUMENT COUNTS:  Correct.  COMPLICATIONS:  No complications.  ANTIBIOTICS:  Perioperative antibiotics were given.  INDICATIONS:  The patient is a 78 year old male who presents with worsening left knee pain secondary to bone-on-bone end-stage arthritis.  The patient has significant varus deformity and flexion contracture.  He has failed conservative management and  desires operative treatment to eliminate pain and restore function.  Informed consent obtained.  DESCRIPTION OF PROCEDURE:  After an adequate level of spinal anesthesia was achieved plus adductor canal block, the patient was positioned supine on the operating room table.  Nonsterile tourniquet was placed on the left proximal thigh.  The left leg was  sterilely prepped and draped in the usual manner.  A timeout called verifying correct patient and correct side.  The patient had a significant flexion contracture of about 10 degrees, but good flexion to about 120.  He had pseudolaxity to varus and  valgus stress and significant varus malalignment.  After we timed out we then elevated the leg and exsanguinated the limb with an Esmarch.  We then inflated the tourniquet to 275 mmHg.  We placed the knee in  flexion and performed a longitudinal midline  incision with a 10-blade scalpel.  Dissection down through subcutaneous tissues.  We used a fresh 10 blade for the medial parapatellar arthrotomy.  We divided the lateral patellofemoral ligaments.  We then everted the patella exposing the distal femur,  which was devoid of cartilage.  We entered the distal femur with a step-cut drill.  We then placed our intramedullary guide and resected 11 mm off the distal femur, set in 5 degrees of valgus.  With our distal femoral cut done, we sized our femur to a  size 7, anterior down, performing anterior, posterior, and chamfer cuts with a 4-in-1 block.  We then removed ACL, PCL meniscal tissue, subluxing the tibia anteriorly.  We then used our external jig to cut the tibia 90 degrees perpendicular to the long  axis of the tibia with minimal posterior slope for this posterior cruciate substituting prosthesis.  Once we had our tibial cut done, we used a laminar spreader and then removed posterior femoral condyle osteophytes with a curved osteotome.  We then  injected the posterior capsule with a combination of Marcaine, Exparel and saline.  Next, we checked our gaps, which were symmetric at 5 mm.  We then went ahead and completed our tibial preparation for the size 7 tibia with a modular drill and keel  punch, externally rotating the component as much as possible for patellar tracking purposes.  Once we had that completed, we went ahead and cut our box for the 7 left femur.  We then trialed with the 7 left femur in place with a size 5 poly trial.  We  were happy with our ability to get full extension and  had good flexion and stability.  We then resurfaced the patella going from a 27 mm thickness down to a 7 *** mm thickness drilling lug holes for the 41 patellar button.  We then trialed with the 41  patellar button in place and ranged the knee and had excellent patellar tracking with no touch technique.  Next, we went ahead  and removed all trial components.  We irrigated thoroughly.  We then dried the bone well and vacuum mixed high viscosity  cement.  We cemented the components into place, tibia, femur, and patella all in one step.  We placed the knee in extension for compression of the implants with a 5-mm poly while the cement set up we also had a patellar clamp on the patella holding  compression.  When all cement was hard on the back table, we removed excess cement with a quarter-inch curved osteotome.  We then went ahead and infiltrated the anterior capsule with Marcaine, Exparel and saline.  We then selected the real size 7, 5-mm  poly placed on the tibial tray after inspecting the posterior aspect of the knee for any additional cement and then reduced the knee.  We had a nice little pop as that reduced.  Had nice extension, excellent flexion, stability, and good tracking.  We  irrigated thoroughly.  We then went ahead and closed the parapatellar arthrotomy with #1 Vicryl suture followed by 0 and 2-0 layered subcutaneous closure and 4-0 running Monocryl for skin.  Steri-Strips were applied followed by a sterile dressing.  The  patient tolerated the surgery well.   PUS D: 10/01/2023 11:17:59 am T: 10/02/2023 4:02:00 am  JOB: [id number]/ [id number]

## 2023-10-02 NOTE — Progress Notes (Signed)
 SATURATION QUALIFICATIONS: (This note is used to comply with regulatory documentation for home oxygen)  Patient Saturations on Room Air at Rest = 88%  Patient Saturations on Room Air while Ambulating = 82%  Patient Saturations on 2 Liters of oxygen while Ambulating = 94%  Please briefly explain why patient needs home oxygen: pt requiring 2L oxygen while resting and ambulating. Noted dyspnea on exertion during physical activity and standing up.

## 2023-10-02 NOTE — Care Management Obs Status (Signed)
 MEDICARE OBSERVATION STATUS NOTIFICATION   Patient Details  Name: Chase Combs MRN: [id number] Date of Birth: [birth year]   Medicare Observation Status Notification Given:  Yes    Piera Downs I Alwin Baars, LCSW 10/02/2023, 9:22 AM

## 2023-10-02 NOTE — Progress Notes (Signed)
    Subjective:  Patient reports pain as mild to moderate.  Denies N/V/CP/SOB. No c/o  Objective:   VITALS:   Vitals:   10/01/23 1710 10/01/23 2241 10/02/23 0108 10/02/23 0543  BP: 121/81 119/67 137/82 120/73  Pulse: 60 74 87 86  Resp: 16 16 16 16   Temp:  98.4 F (36.9 C) 99 F (37.2 C) 97.7 F (36.5 C)  TempSrc:      SpO2: 94% 97% 98% 93%  Weight:      Height:        NAD ABD soft Sensation intact distally Intact pulses distally Dorsiflexion/Plantar flexion intact Incision: drsg taken down, incis c/d/I, Aquacel placed Compartment soft   Lab Results  Component Value Date   WBC 6.0 09/22/2023   HGB 13.0 09/22/2023   HCT 40.0 09/22/2023   MCV 93.7 09/22/2023   PLT 271 09/22/2023   BMET    Component Value Date/Time   NA 140 09/22/2023 0846   NA 142 06/06/2021 1010   K 3.9 09/22/2023 0846   CL 103 09/22/2023 0846   CO2 27 09/22/2023 0846   GLUCOSE 150 (H) 09/22/2023 0846   BUN 14 09/22/2023 0846   BUN 16 06/06/2021 1010   CREATININE 0.60 (L) 09/22/2023 0846   CREATININE 0.65 06/28/2014 0824   CALCIUM  9.4 09/22/2023 0846   EGFR 95 06/06/2021 1010   GFRNONAA >60 09/22/2023 0846     Assessment/Plan: 1 Day Post-Op   Principal Problem:   Status post total knee replacement, left   WBAT with walker DVT ppx: Aspirin , SCDs, TEDS PO pain control PT/OT Dispo: D/C home with OPPT    Margart Shears Matthew Cina 10/02/2023, 8:53 AM   Adonica Hoose, MD [phone] [facility] is now [facility]  [place] [address], [address], [place], [place] [address] Phone: [phone] [email or web address] Facebook  [employer]

## 2023-10-02 NOTE — Plan of Care (Signed)
   Problem: Education: Goal: Knowledge of General Education information will improve Description Including pain rating scale, medication(s)/side effects and non-pharmacologic comfort measures Outcome: Progressing   Problem: Health Behavior/Discharge Planning: Goal: Ability to manage health-related needs will improve Outcome: Progressing

## 2023-10-03 DIAGNOSIS — Z8616 Personal history of COVID-19: Secondary | ICD-10-CM | POA: Diagnosis not present

## 2023-10-03 DIAGNOSIS — M1712 Unilateral primary osteoarthritis, left knee: Secondary | ICD-10-CM | POA: Diagnosis not present

## 2023-10-03 DIAGNOSIS — Z7982 Long term (current) use of aspirin: Secondary | ICD-10-CM | POA: Diagnosis not present

## 2023-10-03 DIAGNOSIS — I10 Essential (primary) hypertension: Secondary | ICD-10-CM | POA: Diagnosis not present

## 2023-10-03 DIAGNOSIS — Z79899 Other long term (current) drug therapy: Secondary | ICD-10-CM | POA: Diagnosis not present

## 2023-10-03 DIAGNOSIS — I2510 Atherosclerotic heart disease of native coronary artery without angina pectoris: Secondary | ICD-10-CM | POA: Diagnosis not present

## 2023-10-03 DIAGNOSIS — Z955 Presence of coronary angioplasty implant and graft: Secondary | ICD-10-CM | POA: Diagnosis not present

## 2023-10-03 NOTE — Progress Notes (Signed)
 Physical Therapy Treatment Patient Details Name: Chase Combs MRN: [id number] DOB: Jun 11, 1945 Today's Date: 10/03/2023   History of Present Illness patient is a 78 year old male who is s/p L TKR and with hx of CAD, STEMI, lumbar back surgery, and pre-diabetes    PT Comments  Pt continues very cooperative but limited by pain with activity and inability to tolerated WB on L LE despite premed.    If plan is discharge home, recommend the following: A lot of help with walking and/or transfers;A lot of help with bathing/dressing/bathroom;Assistance with cooking/housework;Assist for transportation;Help with stairs or ramp for entrance   Can travel by private vehicle        Equipment Recommendations  None recommended by PT    Recommendations for Other Services       Precautions / Restrictions Precautions Precautions: Knee;Fall Required Braces or Orthoses: Knee Immobilizer - Left Knee Immobilizer - Left: Discontinue once straight leg raise with < 10 degree lag Restrictions Weight Bearing Restrictions Per Provider Order: No RLE Weight Bearing Per Provider Order: Weight bearing as tolerated LLE Weight Bearing Per Provider Order: Weight bearing as tolerated     Mobility  Bed Mobility Overal bed mobility: Needs Assistance Bed Mobility: Sit to Supine       Sit to supine: Mod assist   General bed mobility comments: patient needed physical A to get BLE back into bed.    Transfers Overall transfer level: Needs assistance Equipment used: Rolling walker (2 wheels) Transfers: Sit to/from Stand, Bed to chair/wheelchair/BSC Sit to Stand: Min assist, Mod assist   Step pivot transfers: Min assist, Mod assist, +2 safety/equipment, From elevated surface       General transfer comment: cues for LE management and use of UEs to self assist; min/mod assist to bring wt up and fwd and to balance in standing - pt reports use of lift chair at home.  Step pvt recliner to bedside     Ambulation/Gait Ambulation/Gait assistance: Min assist Gait Distance (Feet): 1 Feet Assistive device: Rolling walker (2 wheels) Gait Pattern/deviations: Step-to pattern, Antalgic, Trunk flexed, Decreased stance time - left Gait velocity: decr     General Gait Details: Ambulation attempted but pt unable to tolerate WB on L LE and unable to compensate with UEs.  Pt took two very small steps and unable to continue 2* pain - performed step pvt but had to pull bed in behind to allow safe transfer to sitting   Stairs             Wheelchair Mobility     Tilt Bed    Modified Rankin (Stroke Patients Only)       Balance Overall balance assessment: Needs assistance Sitting-balance support: No upper extremity supported, Feet supported Sitting balance-Leahy Scale: Good     Standing balance support: Bilateral upper extremity supported Standing balance-Leahy Scale: Poor                              Communication Communication Communication: Impaired Factors Affecting Communication: Hearing impaired  Cognition Arousal: Alert Behavior During Therapy: WFL for tasks assessed/performed, Impulsive   PT - Cognitive impairments: No apparent impairments                         Following commands: Intact      Cueing Cueing Techniques: Verbal cues, Gestural cues, Tactile cues  Exercises Total Joint Exercises Ankle Circles/Pumps: AROM, Both, 10  reps Quad Sets: AROM, Left, 10 reps, Supine Heel Slides: Left, AAROM, 10 reps, Supine Straight Leg Raises: AAROM, Left, 10 reps, Supine Goniometric ROM: L knee AAROM -5 - 20 pain limited    General Comments        Pertinent Vitals/Pain Pain Assessment Pain Assessment: 0-10 Pain Score: 10-Worst pain ever Pain Location: L knee with WB/activity Pain Descriptors / Indicators: Aching, Grimacing, Guarding, Sore, Throbbing Pain Intervention(s): Limited activity within patient's tolerance, Monitored during session,  Premedicated before session, Ice applied    Home Living Family/patient expects to be discharged to:: Private residence Living Arrangements: Spouse/significant other Available Help at Discharge: Family;Available 24 hours/day Type of Home: House Home Access: Ramped entrance       Home Layout: One level Home Equipment: Agricultural consultant (2 wheels);Rollator (4 wheels);Cane - single point      Prior Function            PT Goals (current goals can now be found in the care plan section) Acute Rehab PT Goals Patient Stated Goal: Less pain PT Goal Formulation: With patient Time For Goal Achievement: 10/15/23 Potential to Achieve Goals: Good Progress towards PT goals: Not progressing toward goals - comment (Pt limited by pain and inability to tolerate WB on L LE)    Frequency    7X/week      PT Plan      Co-evaluation PT/OT/SLP Co-Evaluation/Treatment: Yes Reason for Co-Treatment: To address functional/ADL transfers;For patient/therapist safety PT goals addressed during session: Mobility/safety with mobility OT goals addressed during session: ADL's and self-care      AM-PAC PT "6 Clicks" Mobility   Outcome Measure  Help needed turning from your back to your side while in a flat bed without using bedrails?: A Little Help needed moving from lying on your back to sitting on the side of a flat bed without using bedrails?: A Little Help needed moving to and from a bed to a chair (including a wheelchair)?: A Lot Help needed standing up from a chair using your arms (e.g., wheelchair or bedside chair)?: A Little Help needed to walk in hospital room?: Total Help needed climbing 3-5 steps with a railing? : Total 6 Click Score: 13    End of Session Equipment Utilized During Treatment: Gait belt;Left knee immobilizer Activity Tolerance: Patient limited by pain Patient left: in bed;with call bell/phone within reach;with bed alarm set Nurse Communication: Mobility status PT Visit  Diagnosis: Difficulty in walking, not elsewhere classified (R26.2);Pain Pain - Right/Left: Left Pain - part of body: Knee     Time: 1610-9604 PT Time Calculation (min) (ACUTE ONLY): 34 min  Charges:    $Therapeutic Activity: 8-22 mins PT General Charges $$ ACUTE PT VISIT: 1 Visit                     Thedora Finlay PT Acute Rehabilitation Services Pager [phone] Office [phone]    Abrish Erny 10/03/2023, 5:12 PM

## 2023-10-03 NOTE — Progress Notes (Signed)
 Physical Therapy Treatment Patient Details Name: Chase Combs MRN: [id number] DOB: [birth year] Today's Date: 10/03/2023   History of Present Illness Pt s/p L TKR and with hx of CAD, STEMI, lumbar back surgery, and pre-diabetes    PT Comments  Pt is cooperative but requiring increased time and assist for all tasks 2* increased pain level this am - pt premedicated ~90 minutes prior to PT.  This date, pt performed therex program with assist and with multiple rest breaks; and up to ambulate very limited distance in room.    If plan is discharge home, recommend the following: A lot of help with walking and/or transfers;A lot of help with bathing/dressing/bathroom;Assistance with cooking/housework;Assist for transportation;Help with stairs or ramp for entrance   Can travel by private vehicle        Equipment Recommendations  None recommended by PT    Recommendations for Other Services       Precautions / Restrictions Precautions Precautions: Knee;Fall Required Braces or Orthoses: Knee Immobilizer - Left Knee Immobilizer - Left: Discontinue once straight leg raise with < 10 degree lag Restrictions Weight Bearing Restrictions Per Provider Order: No RLE Weight Bearing Per Provider Order: Weight bearing as tolerated LLE Weight Bearing Per Provider Order: Weight bearing as tolerated     Mobility  Bed Mobility               General bed mobility comments: Pt in chair and requests back to same    Transfers Overall transfer level: Needs assistance Equipment used: Rolling walker (2 wheels) Transfers: Sit to/from Stand Sit to Stand: Min assist, Mod assist           General transfer comment: cues for LE management and use of UEs to self assist; min/mod assist to bring wt up and fwd and to balance in standing - pt reports use of lift chair at home    Ambulation/Gait Ambulation/Gait assistance: Min assist Gait Distance (Feet): 8 Feet Assistive device: Rolling walker (2  wheels) Gait Pattern/deviations: Step-to pattern, Antalgic, Trunk flexed, Decreased stance time - left Gait velocity: decr     General Gait Details: Pt ambulated with RW, min assist for stability, and +2 for close recliner follow for safety and managment of O2 tank.  Distance limited by pain   Stairs             Wheelchair Mobility     Tilt Bed    Modified Rankin (Stroke Patients Only)       Balance Overall balance assessment: Needs assistance Sitting-balance support: No upper extremity supported, Feet supported Sitting balance-Leahy Scale: Fair     Standing balance support: Bilateral upper extremity supported Standing balance-Leahy Scale: Poor                              Communication Communication Communication: Impaired Factors Affecting Communication: Hearing impaired  Cognition Arousal: Alert Behavior During Therapy: WFL for tasks assessed/performed, Impulsive   PT - Cognitive impairments: No apparent impairments                         Following commands: Intact      Cueing Cueing Techniques: Verbal cues, Gestural cues, Tactile cues  Exercises Total Joint Exercises Ankle Circles/Pumps: AROM, Both, 10 reps Quad Sets: AROM, Left, 10 reps, Supine Heel Slides: Left, AAROM, 10 reps, Supine Straight Leg Raises: AAROM, Left, 10 reps, Supine Goniometric ROM: L knee AAROM -5 -  20 pain limited    General Comments        Pertinent Vitals/Pain Pain Assessment Pain Assessment: 0-10 Pain Score: 10-Worst pain ever Pain Location: L knee with WB/activity Pain Descriptors / Indicators: Aching, Grimacing, Guarding, Sore, Throbbing Pain Intervention(s): Limited activity within patient's tolerance, Monitored during session, Premedicated before session, Ice applied    Home Living                          Prior Function            PT Goals (current goals can now be found in the care plan section) Acute Rehab PT  Goals Patient Stated Goal: Less pain PT Goal Formulation: With patient Time For Goal Achievement: 10/15/23 Potential to Achieve Goals: Good Progress towards PT goals: Progressing toward goals    Frequency    7X/week      PT Plan      Co-evaluation              AM-PAC PT "6 Clicks" Mobility   Outcome Measure  Help needed turning from your back to your side while in a flat bed without using bedrails?: A Little Help needed moving from lying on your back to sitting on the side of a flat bed without using bedrails?: A Little Help needed moving to and from a bed to a chair (including a wheelchair)?: A Little Help needed standing up from a chair using your arms (e.g., wheelchair or bedside chair)?: A Little Help needed to walk in hospital room?: Total Help needed climbing 3-5 steps with a railing? : Total 6 Click Score: 14    End of Session Equipment Utilized During Treatment: Gait belt;Left knee immobilizer Activity Tolerance: Patient limited by pain Patient left: in chair;with call bell/phone within reach;with nursing/sitter in room;with chair alarm set Nurse Communication: Mobility status PT Visit Diagnosis: Difficulty in walking, not elsewhere classified (R26.2);Pain Pain - Right/Left: Left Pain - part of body: Knee     Time: 1610-9604 PT Time Calculation (min) (ACUTE ONLY): 40 min  Charges:    $Gait Training: 8-22 mins $Therapeutic Exercise: 8-22 mins PT General Charges $$ ACUTE PT VISIT: 1 Visit                     Thedora Finlay PT Acute Rehabilitation Services Pager [phone] Office [phone]    Onesty Clair 10/03/2023, 12:53 PM

## 2023-10-03 NOTE — Progress Notes (Signed)
   Subjective:  Chase Combs is a 78 y.o. male, 2 Days Post-Op   s/p Procedure(s): ARTHROPLASTY, KNEE, TOTAL   Patient reports pain as moderate.  Some dizziness when standing. Issues with 02 requirement with PT. Denies n/t, chest pain.   Objective:   VITALS:   Vitals:   10/02/23 1352 10/02/23 2242 10/03/23 0543 10/03/23 0822  BP: 125/73 (!) 161/79 131/73 132/79  Pulse: 83 (!) 105 92 92  Resp: 19 16 16    Temp: 98.4 F (36.9 C) 99.4 F (37.4 C) 99.6 F (37.6 C)   TempSrc:  Oral Oral   SpO2: 93% 95% 98%   Weight:      Height:       In hospital chair NAD  LLE:  Neurovascular intact Sensation intact distally Intact pulses distally Dorsiflexion/Plantar flexion intact Incision: dressing C/D/I No cellulitis present Compartment soft   Lab Results  Component Value Date   WBC 6.0 09/22/2023   HGB 13.0 09/22/2023   HCT 40.0 09/22/2023   MCV 93.7 09/22/2023   PLT 271 09/22/2023   BMET    Component Value Date/Time   NA 140 09/22/2023 0846   NA 142 06/06/2021 1010   K 3.9 09/22/2023 0846   CL 103 09/22/2023 0846   CO2 27 09/22/2023 0846   GLUCOSE 150 (H) 09/22/2023 0846   BUN 14 09/22/2023 0846   BUN 16 06/06/2021 1010   CREATININE 0.60 (L) 09/22/2023 0846   CREATININE 0.65 06/28/2014 0824   CALCIUM  9.4 09/22/2023 0846   EGFR 95 06/06/2021 1010   GFRNONAA >60 09/22/2023 0846     Assessment/Plan: 2 Days Post-Op   Principal Problem:   Status post total knee replacement, left   Advance diet Up with therapy  Dispo: D/c when ambulating well and 02 sats stabilized. We will see how he does with PT today and gauge from there if further work up needed /more time inpatient  Weightbearing Status: WBAT in [place] and walker DVT Prophylaxis: aspirin    Izetta Marshall 10/03/2023, 8:40 AM  Karyl Paget PA-C  Physician Assistant with Dr. Link Rice [place]

## 2023-10-03 NOTE — Progress Notes (Addendum)
 Occupational Therapy Evaluation Patient Details Name: Chase Combs MRN: [id number] DOB: 05-04-1946 Today's Date: 10/03/2023   History of Present Illness   patient is a 78 year old male who is s/p L TKR and with hx of CAD, STEMI, lumbar back surgery, and pre-diabetes     Clinical Impressions Patient is a 78 year old male who was admitted for above. Patient was living at home with wife prior level. Currently, patient is +2 for transfers with increased pain in LLE preventing weight shifting even with RW. Patient was educated on incentive spirometer use during session with minimal cues needed to complete task. Patient was noted to have decreased functional activity tolerance, decreased endurance, decreased standing balance, decreased safety awareness, and decreased knowledge of AD/AE impacting participation in ADLs. Patient will need 24/7 caregiver support in next level of care. OT to continue to follow.     If plan is discharge home, recommend the following:   Two people to help with walking and/or transfers;A lot of help with bathing/dressing/bathroom;Assistance with cooking/housework;Direct supervision/assist for medications management;Assist for transportation;Help with stairs or ramp for entrance;Direct supervision/assist for financial management     Functional Status Assessment   Patient has had a recent decline in their functional status and demonstrates the ability to make significant improvements in function in a reasonable and predictable amount of time.     Equipment Recommendations   Other (comment) (total hip kit)      Precautions/Restrictions   Precautions Precautions: Knee;Fall Required Braces or Orthoses: Knee Immobilizer - Left Knee Immobilizer - Left: Discontinue once straight leg raise with < 10 degree lag Restrictions Weight Bearing Restrictions Per Provider Order: No RLE Weight Bearing Per Provider Order: Weight bearing as tolerated LLE Weight Bearing  Per Provider Order: Weight bearing as tolerated     Mobility Bed Mobility Overal bed mobility: Needs Assistance Bed Mobility: Sit to Supine       Sit to supine: Mod assist   General bed mobility comments: patient needed physical A to get BLE back into bed.          Balance Overall balance assessment: Needs assistance Sitting-balance support: No upper extremity supported, Feet supported Sitting balance-Leahy Scale: Fair     Standing balance support: Bilateral upper extremity supported Standing balance-Leahy Scale: Poor                             ADL either performed or assessed with clinical judgement   ADL Overall ADL's : Needs assistance/impaired Eating/Feeding: Modified independent;Sitting   Grooming: Sitting;Wash/dry face;Set up   Upper Body Bathing: Sitting;Minimal assistance   Lower Body Bathing: Sitting/lateral leans;Total assistance   Upper Body Dressing : Sitting;Minimal assistance   Lower Body Dressing: Sitting/lateral leans;Total assistance Lower Body Dressing Details (indicate cue type and reason): unable to manage KI himself. Toilet Transfer: Maximal assistance;+2 for safety/equipment;+2 for physical assistance;Rolling walker (2 wheels) Toilet Transfer Details (indicate cue type and reason): decreased ability to engage in SPT to bed with episode of incontinence of urine during movement. Toileting- Clothing Manipulation and Hygiene: Bed level;Moderate assistance Toileting - Clothing Manipulation Details (indicate cue type and reason): patient was able to wash penile area himself at bed level.             Vision Patient Visual Report: No change from baseline              Pertinent Vitals/Pain Pain Assessment Pain Assessment: 0-10 Pain Score: 10-Worst pain ever  Pain Location: L knee with WB/activity Pain Descriptors / Indicators: Aching, Grimacing, Guarding, Sore, Throbbing Pain Intervention(s): Limited activity within patient's  tolerance, Monitored during session, Premedicated before session, Ice applied     Extremity/Trunk Assessment Upper Extremity Assessment Upper Extremity Assessment: RUE deficits/detail RUE Deficits / Details: noted to have reduced ROM from fall off scafolding previous years. patient able to FF to about 60 degrees. hand wrist and elbow WFL RUE Coordination: decreased gross motor           Communication Communication Factors Affecting Communication: Hearing impaired   Cognition Arousal: Alert Behavior During Therapy: WFL for tasks assessed/performed, Impulsive               OT - Cognition Comments: patient self reporting being "hard headed" was not open to too many compensatory strategies on this date.                 Following commands: Intact       Cueing  General Comments   Cueing Techniques: Verbal cues;Gestural cues;Tactile cues              Home Living Family/patient expects to be discharged to:: Private residence Living Arrangements: Spouse/significant other Available Help at Discharge: Family;Available 24 hours/day Type of Home: House Home Access: Ramped entrance     Home Layout: One level               Home Equipment: Agricultural consultant (2 wheels);Rollator (4 wheels);Cane - single point          Prior Functioning/Environment Prior Level of Function : Independent/Modified Independent             Mobility Comments: cane or walker as needed      OT Problem List: Decreased activity tolerance;Impaired balance (sitting and/or standing);Decreased safety awareness;Decreased knowledge of precautions;Decreased knowledge of use of DME or AE;Pain   OT Treatment/Interventions: Self-care/ADL training;Energy conservation;Therapeutic exercise;DME and/or AE instruction;Patient/family education;Therapeutic activities;Balance training      OT Goals(Current goals can be found in the care plan section)   Acute Rehab OT Goals Patient Stated Goal:  to go home OT Goal Formulation: With patient Time For Goal Achievement: 10/17/23 Potential to Achieve Goals: Fair ADL Goals Pt Will Perform Lower Body Dressing: with min assist;with adaptive equipment;sitting/lateral leans Pt Will Transfer to Toilet: regular height toilet;ambulating;with mod assist Pt Will Perform Toileting - Clothing Manipulation and hygiene: with supervision;sitting/lateral leans Additional ADL Goal #1: patient to compelte KI don/doff with supervision to increase independence in ADLs.   OT Frequency:  Min 2X/week    Co-evaluation PT/OT/SLP Co-Evaluation/Treatment: Yes Reason for Co-Treatment: To address functional/ADL transfers PT goals addressed during session: Mobility/safety with mobility OT goals addressed during session: ADL's and self-care      AM-PAC OT "6 Clicks" Daily Activity     Outcome Measure Help from another person eating meals?: None Help from another person taking care of personal grooming?: A Little Help from another person toileting, which includes using toliet, bedpan, or urinal?: Total Help from another person bathing (including washing, rinsing, drying)?: A Lot Help from another person to put on and taking off regular upper body clothing?: A Little Help from another person to put on and taking off regular lower body clothing?: A Lot 6 Click Score: 15   End of Session Equipment Utilized During Treatment: Gait belt;Rolling walker (2 wheels);Left knee immobilizer  Activity Tolerance: Patient limited by fatigue;Patient limited by pain Patient left: in bed;with call bell/phone within reach;with bed alarm set  OT  Visit Diagnosis: Unsteadiness on feet (R26.81);Other abnormalities of gait and mobility (R26.89);Pain Pain - Right/Left: Left Pain - part of body: Knee                Time: 4098-1191 OT Time Calculation (min): 26 min Charges:  OT General Charges $OT Visit: 1 Visit OT Evaluation $OT Eval Moderate Complexity: 1 Mod  Chase Combs OTR/L,  MS Acute Rehabilitation Department Office# [phone]   Jame Maze 10/03/2023, 4:42 PM

## 2023-10-04 DIAGNOSIS — Z8616 Personal history of COVID-19: Secondary | ICD-10-CM | POA: Diagnosis not present

## 2023-10-04 DIAGNOSIS — Z79899 Other long term (current) drug therapy: Secondary | ICD-10-CM | POA: Diagnosis not present

## 2023-10-04 DIAGNOSIS — I2510 Atherosclerotic heart disease of native coronary artery without angina pectoris: Secondary | ICD-10-CM | POA: Diagnosis not present

## 2023-10-04 DIAGNOSIS — Z955 Presence of coronary angioplasty implant and graft: Secondary | ICD-10-CM | POA: Diagnosis not present

## 2023-10-04 DIAGNOSIS — M1712 Unilateral primary osteoarthritis, left knee: Secondary | ICD-10-CM | POA: Diagnosis not present

## 2023-10-04 DIAGNOSIS — I10 Essential (primary) hypertension: Secondary | ICD-10-CM | POA: Diagnosis not present

## 2023-10-04 DIAGNOSIS — Z7982 Long term (current) use of aspirin: Secondary | ICD-10-CM | POA: Diagnosis not present

## 2023-10-04 NOTE — Progress Notes (Signed)
 Physical Therapy Treatment Patient Details Name: Chase Combs MRN: [id number] DOB: Nov 21, 1945 Today's Date: 10/04/2023   History of Present Illness patient is a 78 year old male who is s/p L TKR and with hx of CAD, STEMI, lumbar back surgery, and pre-diabetes    PT Comments  Discussion regarding discharge home.  Wife educated on helping leg in for car transfer and to lower seat back to increase room for clearing the leg.  Patient able to perform seated exercise this pm and walk into hallway.  Some evidence of desaturation with monitor after ambulation though improves with pursed lip breathing.  Patient continues to report he was short of breath at times at baseline.  Feel stable for home though concern for lack of BM and tight abdomen.  Stable from PT standpoint and HEP issued as pt relates will cancel initial outpatient appointments till he feels stronger to get out of the house.      If plan is discharge home, recommend the following: Assistance with cooking/housework;Assist for transportation;Help with stairs or ramp for entrance;A little help with walking and/or transfers;A little help with bathing/dressing/bathroom   Can travel by private vehicle        Equipment Recommendations       Recommendations for Other Services       Precautions / Restrictions Precautions Precautions: Fall;Knee Required Braces or Orthoses: Knee Immobilizer - Left Knee Immobilizer - Left: Discontinue once straight leg raise with < 10 degree lag Restrictions Weight Bearing Restrictions Per Provider Order: No     Mobility  Bed Mobility Overal bed mobility: Needs Assistance Bed Mobility: Supine to Sit     Supine to sit: Min assist     General bed mobility comments: in recliner    Transfers Overall transfer level: Needs assistance Equipment used: Rolling walker (2 wheels) Transfers: Sit to/from Stand Sit to Stand: Contact guard assist           General transfer comment: increased time,  assist for balance    Ambulation/Gait Ambulation/Gait assistance: Contact guard assist, Supervision Gait Distance (Feet): 80 Feet Assistive device: Rolling walker (2 wheels) Gait Pattern/deviations: Decreased stride length, Shuffle, Antalgic, Decreased stance time - left       General Gait Details: stopped to stand to use urinal in the hallway, limited weight tolerance L LE   Stairs             Wheelchair Mobility     Tilt Bed    Modified Rankin (Stroke Patients Only)       Balance Overall balance assessment: Needs assistance Sitting-balance support: No upper extremity supported Sitting balance-Leahy Scale: Good     Standing balance support: Bilateral upper extremity supported Standing balance-Leahy Scale: Poor Standing balance comment: reliant on UE support, though can stand without PT support                            Communication Communication Communication: Impaired Factors Affecting Communication: Hearing impaired  Cognition Arousal: Alert Behavior During Therapy: WFL for tasks assessed/performed   PT - Cognitive impairments: No apparent impairments                         Following commands: Intact      Cueing Cueing Techniques: Verbal cues, Gestural cues, Tactile cues  Exercises Total Joint Exercises Ankle Circles/Pumps: AROM, 10 reps, Supine, Both Quad Sets: AROM, Left, 10 reps, Supine Towel Squeeze: AROM, Both, 10  reps, Supine Short Arc Quad: AROM, Left, 10 reps, Supine Heel Slides: Left, AAROM, 20 reps, Seated (foot on the floor) Hip ABduction/ADduction: 10 reps, AAROM, AROM, Left, Supine Straight Leg Raises: AAROM, Left, 10 reps, Supine Long Arc Quad: AROM, Left, 5 reps, Seated    General Comments General comments (skin integrity, edema, etc.): on RA at rest SpO2 91%, ambulation on RA, spot check 89%, down to 85% after seated from walk, cues for PLB up to 91%      Pertinent Vitals/Pain Pain Assessment Pain  Assessment: Faces Pain Score: 2  Faces Pain Scale: Hurts even more Pain Location: L knee with weight bearing Pain Descriptors / Indicators: Guarding, Grimacing, Sore Pain Intervention(s): Monitored during session, Repositioned    Home Living                          Prior Function            PT Goals (current goals can now be found in the care plan section) Progress towards PT goals: Progressing toward goals    Frequency    7X/week      PT Plan      Co-evaluation              AM-PAC PT "6 Clicks" Mobility   Outcome Measure  Help needed turning from your back to your side while in a flat bed without using bedrails?: A Little Help needed moving from lying on your back to sitting on the side of a flat bed without using bedrails?: A Little Help needed moving to and from a bed to a chair (including a wheelchair)?: A Little Help needed standing up from a chair using your arms (e.g., wheelchair or bedside chair)?: A Little Help needed to walk in hospital room?: A Little Help needed climbing 3-5 steps with a railing? : Total 6 Click Score: 16    End of Session Equipment Utilized During Treatment: Gait belt Activity Tolerance: Patient limited by fatigue Patient left: in chair;with call bell/phone within reach;with family/visitor present   PT Visit Diagnosis: Difficulty in walking, not elsewhere classified (R26.2);Pain Pain - Right/Left: Left Pain - part of body: Knee     Time: 1610-9604 PT Time Calculation (min) (ACUTE ONLY): 29 min  Charges:    $Gait Training: 8-22 mins $Therapeutic Activity: 8-22 mins PT General Charges $$ ACUTE PT VISIT: 1 Visit                     Abigail Hoff, PT Acute Rehabilitation Services Office:[phone] 10/04/2023    Chase Combs 10/04/2023, 4:24 PM

## 2023-10-04 NOTE — Progress Notes (Signed)
 Physical Therapy Treatment Patient Details Name: Chase Combs MRN: [id number] DOB: 1945-08-04 Today's Date: 10/04/2023   History of Present Illness patient is a 78 year old male who is s/p L TKR and with hx of CAD, STEMI, lumbar back surgery, and pre-diabetes    PT Comments  Patient progressing with in room ambulation and able to demonstrate SpO2 >90% while on RA for much of session.  Patient concerned about follow up outpatient PT feeling he will be too fatigued to participate with effort to get out of his home.  LCSW aware and plans to communicate with CM.  Patient with ramped entry and plans to sleep in lift chair.  Feel he does need at least one more session for ensuring no O2 needs for home and for activity progression.  Also with some stomach pain and states no BM since admission.  PT will follow up.   If plan is discharge home, recommend the following: Assistance with cooking/housework;Assist for transportation;Help with stairs or ramp for entrance;A little help with walking and/or transfers;A little help with bathing/dressing/bathroom   Can travel by private vehicle        Equipment Recommendations  None recommended by PT    Recommendations for Other Services       Precautions / Restrictions Precautions Precautions: Fall;Knee Required Braces or Orthoses: Knee Immobilizer - Left Knee Immobilizer - Left: Discontinue once straight leg raise with < 10 degree lag Restrictions Weight Bearing Restrictions Per Provider Order: No     Mobility  Bed Mobility Overal bed mobility: Needs Assistance Bed Mobility: Supine to Sit     Supine to sit: Min assist     General bed mobility comments: lifting help for trunk to sit    Transfers   Equipment used: Rolling walker (2 wheels) Transfers: Sit to/from Stand Sit to Stand: Min assist, From elevated surface           General transfer comment: cues for hand placement, reports has lift reclier at home so elevated bed height     Ambulation/Gait Ambulation/Gait assistance: Contact guard assist Gait Distance (Feet): 25 Feet Assistive device: Rolling walker (2 wheels)         General Gait Details: assist for balance, cues for UE use for off loading painful L LE.  Walked to bathroom then to Medical illustrator Bed    Modified Rankin (Stroke Patients Only)       Balance Overall balance assessment: Needs assistance Sitting-balance support: No upper extremity supported Sitting balance-Leahy Scale: Good     Standing balance support: Bilateral upper extremity supported Standing balance-Leahy Scale: Poor Standing balance comment: reliant on UE support, though can stand without PT support                            Communication Communication Communication: Impaired Factors Affecting Communication: Hearing impaired  Cognition Arousal: Alert Behavior During Therapy: WFL for tasks assessed/performed   PT - Cognitive impairments: No apparent impairments                         Following commands: Intact      Cueing Cueing Techniques: Verbal cues, Gestural cues, Tactile cues  Exercises Total Joint Exercises Ankle Circles/Pumps: AROM, 10 reps, Supine, Both Quad Sets: AROM, Left, 10 reps, Supine Towel Squeeze: AROM, Both, 10  reps, Supine Short Arc Quad: AROM, Left, 10 reps, Supine Heel Slides: Left, AAROM, 10 reps, Supine Hip ABduction/ADduction: 10 reps, AAROM, AROM, Left, Supine Straight Leg Raises: AAROM, Left, 10 reps, Supine    General Comments General comments (skin integrity, edema, etc.): on 2L O2 upon PT entry, SpO2 98%, placed on RA at rest SpO2 94%, cues for breathing throughout therex SpO2 90-96%; ambulated on RA SpO2 spot check 91%, after seated in recliner SpO2 86%, up to 91% with cue for pursed lip breathing, RN aware to check on pt and placed back on continuous monitoring.  Wife in the room.  Discussed with SW pt  concern for outpatient PT as feels he will be too fatigued for getting out of the home.      Pertinent Vitals/Pain Pain Assessment Pain Score: 2  Pain Location: in supine, increased with weight bearing Pain Descriptors / Indicators: Sore Pain Intervention(s): Repositioned, Monitored during session, Ice applied    Home Living                          Prior Function            PT Goals (current goals can now be found in the care plan section) Progress towards PT goals: Progressing toward goals    Frequency    7X/week      PT Plan      Co-evaluation              AM-PAC PT "6 Clicks" Mobility   Outcome Measure  Help needed turning from your back to your side while in a flat bed without using bedrails?: A Little Help needed moving from lying on your back to sitting on the side of a flat bed without using bedrails?: A Little Help needed moving to and from a bed to a chair (including a wheelchair)?: A Little Help needed standing up from a chair using your arms (e.g., wheelchair or bedside chair)?: A Little Help needed to walk in hospital room?: A Little Help needed climbing 3-5 steps with a railing? : Total 6 Click Score: 16    End of Session Equipment Utilized During Treatment: Gait belt;Left knee immobilizer Activity Tolerance: Patient limited by fatigue Patient left: in chair;with call bell/phone within reach;with family/visitor present   PT Visit Diagnosis: Difficulty in walking, not elsewhere classified (R26.2);Pain Pain - Right/Left: Left Pain - part of body: Knee     Time: 1610-9604 PT Time Calculation (min) (ACUTE ONLY): 39 min  Charges:    $Gait Training: 8-22 mins $Therapeutic Exercise: 8-22 mins $Therapeutic Activity: 8-22 mins PT General Charges $$ ACUTE PT VISIT: 1 Visit                     Abigail Hoff, PT Acute Rehabilitation Services Office:[phone] 10/04/2023    Chase Combs 10/04/2023, 11:38 AM

## 2023-10-04 NOTE — Progress Notes (Signed)
   Subjective: 3 Days Post-Op Procedure(s) (LRB): ARTHROPLASTY, KNEE, TOTAL (Left)  Pt doing well this morning, ambulated to restroom this morning Denies any new symptoms or issues Pain remains moderate with ambulation Otherwise doing well Patient reports pain as moderate.  Objective:   VITALS:   Vitals:   10/03/23 1335 10/03/23 2043  BP: 113/73 132/67  Pulse: 91 95  Resp: 16 16  Temp: (!) 100.9 F (38.3 C) 99.6 F (37.6 C)  SpO2: 99% 97%    Left knee: incision healing well Dressing in place Nv intact distally No rashes or edema  No signs of infection or dvt   LABS No results for input(s): "HGB", "HCT", "WBC", "PLT" in the last 72 hours.  No results for input(s): "NA", "K", "BUN", "CREATININE", "GLUCOSE" in the last 72 hours.   Assessment/Plan: 3 Days Post-Op Procedure(s) (LRB): ARTHROPLASTY, KNEE, TOTAL (Left) Continue to encourage mobilization and therapy Pain management If able to pass therapy today, he may be discharged this afternoon vs tomorrow Please call me for discharge order Pulmonary toilet   Brad Anner Kill, MPAS [facility] is now [facility] [employer] [facility] [address], [address], [place], [place] [address] Phone: [phone] [email or web address] Facebook  [employer]

## 2023-10-04 NOTE — Progress Notes (Signed)
 Occupational Therapy Treatment Patient Details Name: Chase Combs MRN: [id number] DOB: Sep 18, 1945 Today's Date: 10/04/2023   History of present illness patient is a 78 year old male who is s/p L TKR and with hx of CAD, STEMI, lumbar back surgery, and pre-diabetes   OT comments  Patient and wife were educated on on compensatory strategies for ADLs LB dressing/and bathing tasks. Patient was not receptive initially to education with wife encouraging patient to listen to it at least. Wife reported that she would help patient with all ADLs at home as needed. Patient did not want to engage in toileting or transfers on this date reporting he would use urinal at home. Patient was reminded that he had increased difficulty with movement and transfers during OT eval. Patient reported that he was better today and did not need to work on it. OT to continue to follow.        If plan is discharge home, recommend the following:  Two people to help with walking and/or transfers;A lot of help with bathing/dressing/bathroom;Assistance with cooking/housework;Direct supervision/assist for medications management;Assist for transportation;Help with stairs or ramp for entrance;Direct supervision/assist for financial management   Equipment Recommendations  Other (comment) (total hip kit)       Precautions / Restrictions Precautions Precautions: Fall;Knee Required Braces or Orthoses: Knee Immobilizer - Left Knee Immobilizer - Left: Discontinue once straight leg raise with < 10 degree lag Restrictions Weight Bearing Restrictions Per Provider Order: No RLE Weight Bearing Per Provider Order: Weight bearing as tolerated LLE Weight Bearing Per Provider Order: Weight bearing as tolerated       Mobility Bed Mobility               General bed mobility comments: patient was in recliner at start of session and returned to the same.                 ADL either performed or assessed with clinical  judgement   ADL Overall ADL's : Needs assistance/impaired               Lower Body Bathing Details (indicate cue type and reason): patient was educated on long handled sponge. patient       Lower Body Dressing Details (indicate cue type and reason): educated ok total hip kit and foot funnel. patient and wife verbalized understanding. patients wife in room reporting that she would complete LB Dressing/bathing tasks for him if needed while also endorsing that she had knee replacement completed in beginning of year herself. patient was able to demonstrate using reacher to doff sock and sock aid to don sock with min cues after education. patient was educated on foot funnel v.s. long handled shoe horn.   Toilet Transfer Details (indicate cue type and reason): declined needing to work on this at this time reporting he was going to use urinal at home.           General ADL Comments: patient was initally not receptive to education on compensatory strategies for ADLs but with increased time and encouragement from therapist and wife patient was agreeable to engage in session.      Cognition Arousal: Alert Behavior During Therapy: WFL for tasks assessed/performed               OT - Cognition Comments: patients wife was present during session. patient continues to have moments of agitation with compensatory strategy education.  Following commands: Intact                      Pertinent Vitals/ Pain       Pain Assessment Pain Assessment: 0-10 Pain Score: 2  Pain Descriptors / Indicators: Grimacing Pain Intervention(s): Monitored during session         Frequency  Min 2X/week        Progress Toward Goals  OT Goals(current goals can now be found in the care plan section)  Progress towards OT goals: Progressing toward goals     Plan         AM-PAC OT "6 Clicks" Daily Activity     Outcome Measure   Help from another person eating meals?:  None Help from another person taking care of personal grooming?: A Little Help from another person toileting, which includes using toliet, bedpan, or urinal?: Total Help from another person bathing (including washing, rinsing, drying)?: A Lot Help from another person to put on and taking off regular upper body clothing?: A Little Help from another person to put on and taking off regular lower body clothing?: A Lot 6 Click Score: 15    End of Session Equipment Utilized During Treatment: Gait belt;Rolling walker (2 wheels);Left knee immobilizer  OT Visit Diagnosis: Unsteadiness on feet (R26.81);Other abnormalities of gait and mobility (R26.89);Pain Pain - Right/Left: Left Pain - part of body: Knee   Activity Tolerance Patient limited by fatigue;Patient limited by pain   Patient Left in bed;with call bell/phone within reach;with bed alarm set   Nurse Communication          Time: 4098-1191 OT Time Calculation (min): 9 min  Charges: OT General Charges $OT Visit: 1 Visit OT Treatments $Self Care/Home Management : 8-22 mins  Wynette Heckler, MS Acute Rehabilitation Department Office# [phone]   Jame Maze 10/04/2023, 4:04 PM

## 2023-10-04 NOTE — TOC Transition Note (Signed)
 Transition of Care [facility]) - Discharge Note   Patient Details  Name: Chase Combs MRN: [id number] Date of Birth: [birth year]  [facility] CM/SW Contact:  Delilah Fend, LCSW Phone Number: 10/04/2023, 2:56 PM   Clinical Narrative:    Met with pt and spouse today and confirming pt has needed DME in the home.  OPPT already arranged with [facility].  No further TOC needs.   Final next level of care: OP Rehab Barriers to Discharge: Barriers Resolved   Patient Goals and CMS Choice Patient states their goals for this hospitalization and ongoing recovery are:: return home          Discharge Placement                       Discharge Plan and Services Additional resources added to the After Visit Summary for                  DME Arranged: N/A DME Agency: NA                  Social Drivers of Health (SDOH) Interventions SDOH Screenings   Food Insecurity: No Food Insecurity (10/01/2023)  Housing: Low Risk  (10/01/2023)  Transportation Needs: No Transportation Needs (10/01/2023)  Utilities: Not At Risk (10/01/2023)  Social Connections: Moderately Integrated (10/01/2023)  Tobacco Use: Low Risk  (10/01/2023)     Readmission Risk Interventions     No data to display

## 2023-10-04 NOTE — Progress Notes (Signed)
 Orthopedics Progress Note  Subjective: Patient feeling better this afternoon.   Objective:  Vitals:   10/04/23 1526 10/04/23 1527  BP:    Pulse:    Resp:    Temp:    SpO2: (!) 85% 96%    General: Awake and alert  Musculoskeletal: Left knee dressing CDI. Moderate swelling. Neg Homans Neurovascularly intact  Lab Results  Component Value Date   WBC 6.0 09/22/2023   HGB 13.0 09/22/2023   HCT 40.0 09/22/2023   MCV 93.7 09/22/2023   PLT 271 09/22/2023       Component Value Date/Time   NA 140 09/22/2023 0846   NA 142 06/06/2021 1010   K 3.9 09/22/2023 0846   CL 103 09/22/2023 0846   CO2 27 09/22/2023 0846   GLUCOSE 150 (H) 09/22/2023 0846   BUN 14 09/22/2023 0846   BUN 16 06/06/2021 1010   CREATININE 0.60 (L) 09/22/2023 0846   CREATININE 0.65 06/28/2014 0824   CALCIUM  9.4 09/22/2023 0846   GFRNONAA >60 09/22/2023 0846   GFRAA 105 03/01/2020 0931    Lab Results  Component Value Date   INR 1.2 02/16/2009    Assessment/Plan: POD #3 s/p Procedure(s): ARTHROPLASTY, KNEE, TOTAL Home this evening as he has cleared therapy BM yesterday.  DVT prophylaxis Follow up in two weeks  Loreta Rome. Brunilda Capra, MD 10/04/2023 4:15 PM  t

## 2023-10-04 NOTE — Progress Notes (Signed)
 Patient ambulated to the bathroom.

## 2023-10-04 NOTE — Discharge Summary (Signed)
 In most cases prophylactic antibiotics for Dental procdeures after total joint surgery are not necessary.  Exceptions are as follows:  1. History of prior total joint infection  2. Severely immunocompromised (Organ Transplant, cancer chemotherapy, Rheumatoid biologic meds such as Humera)  3. Poorly controlled diabetes (A1C &gt; 8.0, blood glucose over 200)  If you have one of these conditions, contact your surgeon for an antibiotic prescription, prior to your dental procedure. Orthopedic Discharge Summary        Physician Discharge Summary  Patient ID: Chase Combs MRN: [id number] DOB/AGE: 1945/08/15 78 y.o.  Admit date: 10/01/2023 Discharge date: 10/04/2023   Procedures:  Procedure(s) (LRB): ARTHROPLASTY, KNEE, TOTAL (Left)  Attending Physician:  Dr. Marionette Sick  Admission Diagnoses:   Left knee end stage OA  Discharge Diagnoses:  same   Past Medical History:  Diagnosis Date   Abdominal hernia    per pt   Arthritis    hands   Benign localized prostatic hyperplasia with lower urinary tract symptoms (LUTS)    CAD (coronary artery disease) 05/2004   cardiologist--- dr Addie Holstein;  positive stress test , had outpt cardiac cath 05-27-2004 stenosis RI;  06-18-2004 cath w/ PCI and BMS x1 to pRI;   STEMI 02-16-2009  s/p cath w/ PCI thrombectomy for total occluded RCA , BMS x1 to pRCA;  nuclear stress test 03-15-2012 low risk no ischemia but sig inferior-inferolateral infarct , ef 51%   Dyslipidemia    Essential hypertension    Heart murmur    History of COVID-19 05/2020   per pt mild symptoms that resolved   History of ST elevation myocardial infarction (STEMI) 02/16/2009   inferior STEMI   cath w/ pci w/ BMS to pRCA   History of urinary retention    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[facility]) 04/2021   urologist--- dr borden/ radiation oncology-- dr Lorri Rota;  dx 12/ 2022,  Gleason 4+5, PSA 24.6, volume 94.6cc;  plan to start IMRT   OSA (obstructive sleep apnea)    per  pt dx approx 2008, no cpap intolerant   PONV (postoperative nausea and vomiting)    Pre-diabetes    S/p bare metal coronary artery stent    01/ 2006  x1 BMS to pRI (CoStar study stent);  and 09/ 2010  x1 BMS to pRCA    PCP: Ilsa Maltese, PA   Discharged Condition: good  Hospital Course:  Patient underwent the above stated procedure on 10/01/2023. Patient tolerated the procedure well and brought to the recovery room in good condition and subsequently to the floor. Patient had an uncomplicated hospital course and was stable for discharge.   Disposition: Discharge disposition: 01-Home or Self Care      with follow up in 2 weeks    Follow-up Information     Belton Boy, PA-C. Go on 10/13/2023.   Specialty: Physician Assistant Why: You are scheduled for a post op appointment on Wednesday 10/13/23 at 3:30pm Contact information: [address] [place] [address] [phone]         Alan All.. Go on 10/04/2023.   Why: You are scheduled for physical therapy appointment on Monday 10/04/23 at 1:00pm; [address] Contact information: [address] [place] [address] [phone]                 Dental Antibiotics:  In most cases prophylactic antibiotics for Dental procdeures after total joint surgery are not necessary.  Exceptions are as follows:  1. History of prior total joint infection  2. Severely immunocompromised (Organ Transplant, cancer chemotherapy, Rheumatoid biologic meds such as Humera)  3. Poorly controlled diabetes (A1C &gt; 8.0, blood glucose over 200)  If you have one of these conditions, contact your surgeon for an antibiotic prescription, prior to your dental procedure.  Discharge Instructions     Call MD / Call 911   Complete by: As directed    If you experience chest pain or shortness of breath, CALL 911 and be transported to the hospital emergency room.  If you develope a fever above  101 F, pus (white drainage) or increased drainage or redness at the wound, or calf pain, call your surgeon's office.   Call MD / Call 911   Complete by: As directed    If you experience chest pain or shortness of breath, CALL 911 and be transported to the hospital emergency room.  If you develope a fever above 101 F, pus (white drainage) or increased drainage or redness at the wound, or calf pain, call your surgeon's office.   Change dressing   Complete by: As directed    Do not remove your dressing.   Constipation Prevention   Complete by: As directed    Drink plenty of fluids.  Prune juice may be helpful.  You may use a stool softener, such as Colace (over the counter) 100 mg twice a day.  Use MiraLax (over the counter) for constipation as needed.   Constipation Prevention   Complete by: As directed    Drink plenty of fluids.  Prune juice may be helpful.  You may use a stool softener, such as Colace (over the counter) 100 mg twice a day.  Use MiraLax (over the counter) for constipation as needed.   Diet - low sodium heart healthy   Complete by: As directed    Diet - low sodium heart healthy   Complete by: As directed    Discharge instructions   Complete by: As directed    Elevate toes above nose. Use cryotherapy as needed for pain and swelling.   Do not put a pillow under the knee. Place it under the heel.   Complete by: As directed    Driving restrictions   Complete by: As directed    No driving for 6 weeks   Increase activity slowly as tolerated   Complete by: As directed    Increase activity slowly as tolerated   Complete by: As directed    Lifting restrictions   Complete by: As directed    No lifting for 6 weeks   Post-operative opioid taper instructions:   Complete by: As directed    POST-OPERATIVE OPIOID TAPER INSTRUCTIONS: It is important to wean off of your opioid medication as soon as possible. If you do not need pain medication after your surgery it is ok to stop day  one. Opioids include: Codeine, Hydrocodone(Norco, Vicodin), Oxycodone(Percocet, oxycontin) and hydromorphone amongst others.  Long term and even short term use of opiods can cause: Increased pain response Dependence Constipation Depression Respiratory depression And more.  Withdrawal symptoms can include Flu like symptoms Nausea, vomiting And more Techniques to manage these symptoms Hydrate well Eat regular healthy meals Stay active Use relaxation techniques(deep breathing, meditating, yoga) Do Not substitute Alcohol to help with tapering If you have been on opioids for less than two weeks and do not have pain than it is ok to stop all together.  Plan to wean off of opioids This plan  should start within one week post op of your joint replacement. Maintain the same interval or time between taking each dose and first decrease the dose.  Cut the total daily intake of opioids by one tablet each day Next start to increase the time between doses. The last dose that should be eliminated is the evening dose.      Post-operative opioid taper instructions:   Complete by: As directed    POST-OPERATIVE OPIOID TAPER INSTRUCTIONS: It is important to wean off of your opioid medication as soon as possible. If you do not need pain medication after your surgery it is ok to stop day one. Opioids include: Codeine, Hydrocodone(Norco, Vicodin), Oxycodone(Percocet, oxycontin) and hydromorphone amongst others.  Long term and even short term use of opiods can cause: Increased pain response Dependence Constipation Depression Respiratory depression And more.  Withdrawal symptoms can include Flu like symptoms Nausea, vomiting And more Techniques to manage these symptoms Hydrate well Eat regular healthy meals Stay active Use relaxation techniques(deep breathing, meditating, yoga) Do Not substitute Alcohol to help with tapering If you have been on opioids for less than two weeks and do not have  pain than it is ok to stop all together.  Plan to wean off of opioids This plan should start within one week post op of your joint replacement. Maintain the same interval or time between taking each dose and first decrease the dose.  Cut the total daily intake of opioids by one tablet each day Next start to increase the time between doses. The last dose that should be eliminated is the evening dose.      TED hose   Complete by: As directed    Use stockings (TED hose) for 2 weeks on both leg(s).  You may remove them at night for sleeping.   Weight bearing as tolerated   Complete by: As directed        Allergies as of 10/04/2023       Reactions   Codeine Nausea And Vomiting   Latex Rash   "Oral blisters when used at dentist"        Medication List     TAKE these medications    amLODipine  10 MG tablet Commonly known as: NORVASC  TAKE 1 TABLET BY MOUTH EVERY DAY   aspirin  EC 81 MG tablet Take 1 tablet (81 mg total) by mouth in the morning and at bedtime. What changed: when to take this   Cannabidiol Powd Apply 1 Application topically 3 (three) times daily as needed (knee pain.). CBD cream   furosemide  20 MG tablet Commonly known as: LASIX  Take 1 tablet (20 mg total) by mouth daily.   hydrochlorothiazide  25 MG tablet Commonly known as: HYDRODIURIL  Take 25 mg by mouth daily.   ibuprofen 200 MG tablet Commonly known as: ADVIL Take 400 mg by mouth every 8 (eight) hours as needed (pain).   methocarbamol 500 MG tablet Commonly known as: ROBAXIN Take 1 tablet (500 mg total) by mouth every 8 (eight) hours as needed for muscle spasms.   metoprolol  succinate 25 MG 24 hr tablet Commonly known as: TOPROL -XL Take 0.5 tablets (12.5 mg total) by mouth daily.   multivitamin with minerals Tabs tablet Take 1 tablet by mouth in the morning.   ondansetron  4 MG tablet Commonly known as: Zofran  Take 1 tablet (4 mg total) by mouth every 8 (eight) hours as needed for nausea,  vomiting or refractory nausea / vomiting.   oxyCODONE 5 MG immediate release tablet Commonly  known as: Roxicodone Take 1-2 tablets (5-10 mg total) by mouth every 4 (four) hours as needed for severe pain (pain score 7-10).   rosuvastatin  20 MG tablet Commonly known as: CRESTOR  Take 1 tablet (20 mg total) by mouth daily. What changed: when to take this   tamsulosin  0.4 MG Caps capsule Commonly known as: Flomax  Take 1 capsule (0.4 mg total) by mouth daily after supper.   valsartan  320 MG tablet Commonly known as: DIOVAN  TAKE 1 TABLET BY MOUTH EVERY DAY               Discharge Care Instructions  (From admission, onward)           Start     Ordered   10/02/23 0000  Weight bearing as tolerated        10/02/23 0900   10/02/23 0000  Change dressing       Comments: Do not remove your dressing.   10/02/23 0900              Signed: Lorriane Rote 10/04/2023, 4:20 PM  [facility] is now [facility] [address], [address], [place], [place] [address] Phone: [phone] Facebook  Instagram  [employer]

## 2023-10-05 ENCOUNTER — Encounter (HOSPITAL_COMMUNITY): Payer: Self-pay | Admitting: Orthopedic Surgery

## 2023-10-06 DIAGNOSIS — I10 Essential (primary) hypertension: Secondary | ICD-10-CM | POA: Diagnosis not present

## 2023-10-06 DIAGNOSIS — M19041 Primary osteoarthritis, right hand: Secondary | ICD-10-CM | POA: Diagnosis not present

## 2023-10-06 DIAGNOSIS — I252 Old myocardial infarction: Secondary | ICD-10-CM | POA: Diagnosis not present

## 2023-10-06 DIAGNOSIS — M19042 Primary osteoarthritis, left hand: Secondary | ICD-10-CM | POA: Diagnosis not present

## 2023-10-06 DIAGNOSIS — Z79891 Long term (current) use of opiate analgesic: Secondary | ICD-10-CM | POA: Diagnosis not present

## 2023-10-06 DIAGNOSIS — I2510 Atherosclerotic heart disease of native coronary artery without angina pectoris: Secondary | ICD-10-CM | POA: Diagnosis not present

## 2023-10-06 DIAGNOSIS — R338 Other retention of urine: Secondary | ICD-10-CM | POA: Diagnosis not present

## 2023-10-06 DIAGNOSIS — I493 Ventricular premature depolarization: Secondary | ICD-10-CM | POA: Diagnosis not present

## 2023-10-06 DIAGNOSIS — N179 Acute kidney failure, unspecified: Secondary | ICD-10-CM | POA: Diagnosis not present

## 2023-10-06 DIAGNOSIS — Z7982 Long term (current) use of aspirin: Secondary | ICD-10-CM | POA: Diagnosis not present

## 2023-10-06 DIAGNOSIS — Z96652 Presence of left artificial knee joint: Secondary | ICD-10-CM | POA: Diagnosis not present

## 2023-10-06 DIAGNOSIS — G4733 Obstructive sleep apnea (adult) (pediatric): Secondary | ICD-10-CM | POA: Diagnosis not present

## 2023-10-06 DIAGNOSIS — E119 Type 2 diabetes mellitus without complications: Secondary | ICD-10-CM | POA: Diagnosis not present

## 2023-10-06 DIAGNOSIS — E785 Hyperlipidemia, unspecified: Secondary | ICD-10-CM | POA: Diagnosis not present

## 2023-10-06 DIAGNOSIS — Z9181 History of falling: Secondary | ICD-10-CM | POA: Diagnosis not present

## 2023-10-06 DIAGNOSIS — Z955 Presence of coronary angioplasty implant and graft: Secondary | ICD-10-CM | POA: Diagnosis not present

## 2023-10-06 DIAGNOSIS — R7303 Prediabetes: Secondary | ICD-10-CM | POA: Diagnosis not present

## 2023-10-06 DIAGNOSIS — Z471 Aftercare following joint replacement surgery: Secondary | ICD-10-CM | POA: Diagnosis not present

## 2023-10-09 ENCOUNTER — Emergency Department (HOSPITAL_BASED_OUTPATIENT_CLINIC_OR_DEPARTMENT_OTHER)

## 2023-10-09 ENCOUNTER — Other Ambulatory Visit: Payer: Self-pay

## 2023-10-09 ENCOUNTER — Encounter (HOSPITAL_BASED_OUTPATIENT_CLINIC_OR_DEPARTMENT_OTHER): Payer: Self-pay

## 2023-10-09 ENCOUNTER — Inpatient Hospital Stay (HOSPITAL_BASED_OUTPATIENT_CLINIC_OR_DEPARTMENT_OTHER)
Admission: EM | Admit: 2023-10-09 | Discharge: 2023-10-27 | DRG: 393 | Disposition: A | Discharge status: Home Health | Attending: Internal Medicine | Admitting: Internal Medicine

## 2023-10-09 DIAGNOSIS — G4700 Insomnia, unspecified: Secondary | ICD-10-CM | POA: Diagnosis not present

## 2023-10-09 DIAGNOSIS — K9189 Other postprocedural complications and disorders of digestive system: Principal | ICD-10-CM

## 2023-10-09 DIAGNOSIS — I472 Ventricular tachycardia, unspecified: Secondary | ICD-10-CM | POA: Diagnosis present

## 2023-10-09 DIAGNOSIS — R933 Abnormal findings on diagnostic imaging of other parts of digestive tract: Secondary | ICD-10-CM

## 2023-10-09 DIAGNOSIS — T85518A Breakdown (mechanical) of other gastrointestinal prosthetic devices, implants and grafts, initial encounter: Secondary | ICD-10-CM | POA: Diagnosis not present

## 2023-10-09 DIAGNOSIS — K567 Ileus, unspecified: Secondary | ICD-10-CM

## 2023-10-09 DIAGNOSIS — D72829 Elevated white blood cell count, unspecified: Secondary | ICD-10-CM | POA: Diagnosis not present

## 2023-10-09 DIAGNOSIS — K6389 Other specified diseases of intestine: Secondary | ICD-10-CM | POA: Diagnosis not present

## 2023-10-09 DIAGNOSIS — R638 Other symptoms and signs concerning food and fluid intake: Secondary | ICD-10-CM | POA: Diagnosis not present

## 2023-10-09 DIAGNOSIS — I252 Old myocardial infarction: Secondary | ICD-10-CM

## 2023-10-09 DIAGNOSIS — Z79899 Other long term (current) drug therapy: Secondary | ICD-10-CM

## 2023-10-09 DIAGNOSIS — K648 Other hemorrhoids: Secondary | ICD-10-CM | POA: Diagnosis present

## 2023-10-09 DIAGNOSIS — J9601 Acute respiratory failure with hypoxia: Secondary | ICD-10-CM | POA: Diagnosis present

## 2023-10-09 DIAGNOSIS — Z96652 Presence of left artificial knee joint: Secondary | ICD-10-CM | POA: Diagnosis present

## 2023-10-09 DIAGNOSIS — N179 Acute kidney failure, unspecified: Secondary | ICD-10-CM | POA: Diagnosis not present

## 2023-10-09 DIAGNOSIS — R0902 Hypoxemia: Secondary | ICD-10-CM | POA: Diagnosis not present

## 2023-10-09 DIAGNOSIS — Z951 Presence of aortocoronary bypass graft: Secondary | ICD-10-CM

## 2023-10-09 DIAGNOSIS — R509 Fever, unspecified: Secondary | ICD-10-CM

## 2023-10-09 DIAGNOSIS — I10 Essential (primary) hypertension: Secondary | ICD-10-CM | POA: Diagnosis not present

## 2023-10-09 DIAGNOSIS — E66811 Obesity, class 1: Secondary | ICD-10-CM | POA: Diagnosis present

## 2023-10-09 DIAGNOSIS — E876 Hypokalemia: Secondary | ICD-10-CM

## 2023-10-09 DIAGNOSIS — J690 Pneumonitis due to inhalation of food and vomit: Secondary | ICD-10-CM | POA: Diagnosis not present

## 2023-10-09 DIAGNOSIS — Z6831 Body mass index (BMI) 31.0-31.9, adult: Secondary | ICD-10-CM

## 2023-10-09 DIAGNOSIS — Z8616 Personal history of COVID-19: Secondary | ICD-10-CM

## 2023-10-09 DIAGNOSIS — R140 Abdominal distension (gaseous): Secondary | ICD-10-CM | POA: Diagnosis not present

## 2023-10-09 DIAGNOSIS — E785 Hyperlipidemia, unspecified: Secondary | ICD-10-CM | POA: Diagnosis present

## 2023-10-09 DIAGNOSIS — E872 Acidosis, unspecified: Secondary | ICD-10-CM | POA: Diagnosis present

## 2023-10-09 DIAGNOSIS — D6489 Other specified anemias: Secondary | ICD-10-CM | POA: Diagnosis present

## 2023-10-09 DIAGNOSIS — R7401 Elevation of levels of liver transaminase levels: Secondary | ICD-10-CM

## 2023-10-09 DIAGNOSIS — E43 Unspecified severe protein-calorie malnutrition: Secondary | ICD-10-CM | POA: Diagnosis not present

## 2023-10-09 DIAGNOSIS — E8809 Other disorders of plasma-protein metabolism, not elsewhere classified: Secondary | ICD-10-CM | POA: Diagnosis not present

## 2023-10-09 DIAGNOSIS — E46 Unspecified protein-calorie malnutrition: Secondary | ICD-10-CM | POA: Diagnosis not present

## 2023-10-09 DIAGNOSIS — I700 Atherosclerosis of aorta: Secondary | ICD-10-CM | POA: Diagnosis not present

## 2023-10-09 DIAGNOSIS — N170 Acute kidney failure with tubular necrosis: Secondary | ICD-10-CM | POA: Diagnosis present

## 2023-10-09 DIAGNOSIS — R112 Nausea with vomiting, unspecified: Secondary | ICD-10-CM | POA: Diagnosis not present

## 2023-10-09 DIAGNOSIS — R000 Tachycardia, unspecified: Secondary | ICD-10-CM | POA: Diagnosis not present

## 2023-10-09 DIAGNOSIS — E871 Hypo-osmolality and hyponatremia: Secondary | ICD-10-CM | POA: Diagnosis present

## 2023-10-09 DIAGNOSIS — Z8546 Personal history of malignant neoplasm of prostate: Secondary | ICD-10-CM

## 2023-10-09 DIAGNOSIS — D649 Anemia, unspecified: Secondary | ICD-10-CM | POA: Diagnosis not present

## 2023-10-09 DIAGNOSIS — R197 Diarrhea, unspecified: Secondary | ICD-10-CM | POA: Diagnosis not present

## 2023-10-09 DIAGNOSIS — R7303 Prediabetes: Secondary | ICD-10-CM | POA: Diagnosis present

## 2023-10-09 DIAGNOSIS — G4733 Obstructive sleep apnea (adult) (pediatric): Secondary | ICD-10-CM | POA: Diagnosis present

## 2023-10-09 DIAGNOSIS — R410 Disorientation, unspecified: Secondary | ICD-10-CM | POA: Diagnosis not present

## 2023-10-09 DIAGNOSIS — Z7982 Long term (current) use of aspirin: Secondary | ICD-10-CM | POA: Diagnosis not present

## 2023-10-09 DIAGNOSIS — J9811 Atelectasis: Secondary | ICD-10-CM | POA: Diagnosis not present

## 2023-10-09 DIAGNOSIS — K529 Noninfective gastroenteritis and colitis, unspecified: Secondary | ICD-10-CM | POA: Diagnosis not present

## 2023-10-09 DIAGNOSIS — K5730 Diverticulosis of large intestine without perforation or abscess without bleeding: Secondary | ICD-10-CM | POA: Diagnosis not present

## 2023-10-09 DIAGNOSIS — Y838 Other surgical procedures as the cause of abnormal reaction of the patient, or of later complication, without mention of misadventure at the time of the procedure: Secondary | ICD-10-CM | POA: Diagnosis present

## 2023-10-09 DIAGNOSIS — I2510 Atherosclerotic heart disease of native coronary artery without angina pectoris: Secondary | ICD-10-CM | POA: Diagnosis not present

## 2023-10-09 DIAGNOSIS — K429 Umbilical hernia without obstruction or gangrene: Secondary | ICD-10-CM | POA: Diagnosis present

## 2023-10-09 DIAGNOSIS — Z955 Presence of coronary angioplasty implant and graft: Secondary | ICD-10-CM

## 2023-10-09 DIAGNOSIS — N400 Enlarged prostate without lower urinary tract symptoms: Secondary | ICD-10-CM | POA: Diagnosis present

## 2023-10-09 DIAGNOSIS — I4729 Other ventricular tachycardia: Secondary | ICD-10-CM | POA: Diagnosis not present

## 2023-10-09 DIAGNOSIS — T402X5A Adverse effect of other opioids, initial encounter: Secondary | ICD-10-CM | POA: Diagnosis present

## 2023-10-09 DIAGNOSIS — R7989 Other specified abnormal findings of blood chemistry: Secondary | ICD-10-CM | POA: Diagnosis not present

## 2023-10-09 DIAGNOSIS — I493 Ventricular premature depolarization: Secondary | ICD-10-CM | POA: Diagnosis present

## 2023-10-09 DIAGNOSIS — A09 Infectious gastroenteritis and colitis, unspecified: Secondary | ICD-10-CM | POA: Diagnosis not present

## 2023-10-09 LAB — COMPREHENSIVE METABOLIC PANEL WITH GFR
ALT: 109 U/L — ABNORMAL HIGH (ref 0–44)
AST: 55 U/L — ABNORMAL HIGH (ref 15–41)
Albumin: 3.8 g/dL (ref 3.5–5.0)
Alkaline Phosphatase: 195 U/L — ABNORMAL HIGH (ref 38–126)
Anion gap: 17 — ABNORMAL HIGH (ref 5–15)
BUN: 40 mg/dL — ABNORMAL HIGH (ref 8–23)
CO2: 30 mmol/L (ref 22–32)
Calcium: 9.5 mg/dL (ref 8.9–10.3)
Chloride: 86 mmol/L — ABNORMAL LOW (ref 98–111)
Creatinine, Ser: 1.45 mg/dL — ABNORMAL HIGH (ref 0.61–1.24)
GFR, Estimated: 50 mL/min — ABNORMAL LOW (ref >60)
Glucose, Bld: 196 mg/dL — ABNORMAL HIGH (ref 70–99)
Potassium: 3.1 mmol/L — ABNORMAL LOW (ref 3.5–5.1)
Sodium: 134 mmol/L — ABNORMAL LOW (ref 135–145)
Total Bilirubin: 1.8 mg/dL — ABNORMAL HIGH (ref 0.0–1.2)
Total Protein: 7.5 g/dL (ref 6.5–8.1)

## 2023-10-09 LAB — RESP PANEL BY RT-PCR (RSV, FLU A&B, COVID)  RVPGX2
Influenza A by PCR: NEGATIVE
Influenza B by PCR: NEGATIVE
Resp Syncytial Virus by PCR: NEGATIVE
SARS Coronavirus 2 by RT PCR: NEGATIVE

## 2023-10-09 LAB — CBC WITH DIFFERENTIAL/PLATELET
Abs Immature Granulocytes: 0.01 10*3/uL (ref 0.00–0.07)
Basophils Absolute: 0 10*3/uL (ref 0.0–0.1)
Basophils Relative: 1 %
Eosinophils Absolute: 0 10*3/uL (ref 0.0–0.5)
Eosinophils Relative: 0 %
HCT: 35.3 % — ABNORMAL LOW (ref 39.0–52.0)
Hemoglobin: 12 g/dL — ABNORMAL LOW (ref 13.0–17.0)
Immature Granulocytes: 0 %
Lymphocytes Relative: 13 %
Lymphs Abs: 0.6 10*3/uL — ABNORMAL LOW (ref 0.7–4.0)
MCH: 29.9 pg (ref 26.0–34.0)
MCHC: 34 g/dL (ref 30.0–36.0)
MCV: 88 fL (ref 80.0–100.0)
Monocytes Absolute: 0.6 10*3/uL (ref 0.1–1.0)
Monocytes Relative: 14 %
Neutro Abs: 3.2 10*3/uL (ref 1.7–7.7)
Neutrophils Relative %: 72 %
Platelets: 551 10*3/uL — ABNORMAL HIGH (ref 150–400)
RBC: 4.01 MIL/uL — ABNORMAL LOW (ref 4.22–5.81)
RDW: 14.3 % (ref 11.5–15.5)
WBC: 4.5 10*3/uL (ref 4.0–10.5)
nRBC: 0 % (ref 0.0–0.2)

## 2023-10-09 LAB — TROPONIN T, HIGH SENSITIVITY: Troponin T High Sensitivity: 54 ng/L — ABNORMAL HIGH (ref <19)

## 2023-10-09 LAB — PROTIME-INR
INR: 1.2 (ref 0.8–1.2)
Prothrombin Time: 15.9 s — ABNORMAL HIGH (ref 11.4–15.2)

## 2023-10-09 LAB — LACTIC ACID, PLASMA: Lactic Acid, Venous: 2.1 mmol/L (ref 0.5–1.9)

## 2023-10-09 MED ORDER — ONDANSETRON HCL 4 MG/2ML IJ SOLN
4.0000 mg | Freq: Once | INTRAMUSCULAR | Status: AC
  Administered 2023-10-09: 4 mg via INTRAVENOUS
  Filled 2023-10-09: qty 2

## 2023-10-09 MED ORDER — LACTATED RINGERS IV BOLUS (SEPSIS)
1000.0000 mL | Freq: Once | INTRAVENOUS | Status: AC
  Administered 2023-10-09: 1000 mL via INTRAVENOUS

## 2023-10-09 MED ORDER — AMIODARONE HCL IN DEXTROSE 360-4.14 MG/200ML-% IV SOLN
60.0000 mg/h | INTRAVENOUS | Status: AC
  Administered 2023-10-09: 60 mg/h via INTRAVENOUS
  Filled 2023-10-09: qty 200

## 2023-10-09 MED ORDER — POTASSIUM CHLORIDE 10 MEQ/100ML IV SOLN
10.0000 meq | Freq: Once | INTRAVENOUS | Status: AC
  Administered 2023-10-09: 10 meq via INTRAVENOUS
  Filled 2023-10-09: qty 100

## 2023-10-09 MED ORDER — LACTATED RINGERS IV BOLUS: 1000.0000 mL | Freq: Once | INTRAVENOUS | Status: DC

## 2023-10-09 MED ORDER — PIPERACILLIN-TAZOBACTAM 3.375 G IVPB 30 MIN
3.3750 g | Freq: Once | INTRAVENOUS | Status: AC
  Administered 2023-10-09: 3.375 g via INTRAVENOUS
  Filled 2023-10-09: qty 50

## 2023-10-09 MED ORDER — LACTATED RINGERS IV BOLUS
1000.0000 mL | Freq: Once | INTRAVENOUS | Status: AC
  Administered 2023-10-09: 1000 mL via INTRAVENOUS

## 2023-10-09 MED ORDER — AMIODARONE HCL IN DEXTROSE 360-4.14 MG/200ML-% IV SOLN
30.0000 mg/h | INTRAVENOUS | Status: DC
  Administered 2023-10-10 – 2023-10-11 (×4): 30 mg/h via INTRAVENOUS
  Filled 2023-10-09 (×4): qty 200

## 2023-10-09 MED ORDER — IOHEXOL 300 MG/ML  SOLN
100.0000 mL | Freq: Once | INTRAMUSCULAR | Status: AC | PRN
  Administered 2023-10-09: 100 mL via INTRAVENOUS

## 2023-10-09 MED ORDER — VANCOMYCIN HCL IN DEXTROSE 1-5 GM/200ML-% IV SOLN
1000.0000 mg | Freq: Once | INTRAVENOUS | Status: AC
  Administered 2023-10-09: 1000 mg via INTRAVENOUS

## 2023-10-09 MED ORDER — LACTATED RINGERS IV BOLUS (SEPSIS): 1000.0000 mL | Freq: Once | INTRAVENOUS | Status: DC

## 2023-10-09 MED ORDER — VANCOMYCIN HCL IN DEXTROSE 1-5 GM/200ML-% IV SOLN
1000.0000 mg | Freq: Once | INTRAVENOUS | Status: DC
  Filled 2023-10-09: qty 200

## 2023-10-09 MED ORDER — LACTATED RINGERS IV SOLN: INTRAVENOUS | Status: AC

## 2023-10-09 MED ORDER — AMIODARONE IV BOLUS ONLY 150 MG/100ML
150.0000 mg | Freq: Once | INTRAVENOUS | Status: AC
  Administered 2023-10-09: 150 mg via INTRAVENOUS
  Filled 2023-10-09: qty 100

## 2023-10-09 MED ORDER — SODIUM CHLORIDE 0.9 % IV SOLN
500.0000 mg | Freq: Once | INTRAVENOUS | Status: AC
  Administered 2023-10-09: 500 mg via INTRAVENOUS

## 2023-10-09 NOTE — ED Notes (Signed)
 X2 sets of blood cultures obtained before any abx started

## 2023-10-09 NOTE — ED Provider Notes (Signed)
 [place] EMERGENCY DEPARTMENT AT [facility] Provider Note   CSN: [id number] Arrival date & time: 10/09/23  2125     History  Chief Complaint  Patient presents with   Fatigue   Emesis    ANGELES PAOLUCCI is a 78 y.o. male.  78 year old male presenting with nausea/vomiting/abdominal distention/cough.  Per patient's wife he has been experiencing nausea and vomiting for 1 day, "he is not acting like himself", he had surgery on his left knee last Friday May 9th, he has not had a solid bowel movement since this time but has had a few episodes of small volume diarrhea.  Patient has history of CAD/STEMI, on aspirin .  Patient complains of chest pain, shortness of breath, it is unknown if patient has been febrile at home.   Emesis      Home Medications Prior to Admission medications   Medication Sig Start Date End Date Taking? Authorizing Provider  amLODipine  (NORVASC ) 10 MG tablet TAKE 1 TABLET BY MOUTH EVERY DAY 05/04/23   Arleen Lacer, MD  aspirin  EC 81 MG tablet Take 1 tablet (81 mg total) by mouth in the morning and at bedtime. 10/01/23 10/31/23  Winston Hawking, MD  Cannabidiol  POWD Apply 1 Application topically 3 (three) times daily as needed (knee pain.). CBD cream    [provider]  furosemide  (LASIX ) 20 MG tablet Take 1 tablet (20 mg total) by mouth daily. Patient not taking: Reported on 08/24/2023 06/21/23   Arleen Lacer, MD  hydrochlorothiazide  (HYDRODIURIL ) 25 MG tablet Take 25 mg by mouth daily.    [provider]  ibuprofen  (ADVIL ) 200 MG tablet Take 400 mg by mouth every 8 (eight) hours as needed (pain).    [provider]  methocarbamol  (ROBAXIN ) 500 MG tablet Take 1 tablet (500 mg total) by mouth every 8 (eight) hours as needed for muscle spasms. 10/01/23   Winston Hawking, MD  metoprolol  succinate (TOPROL -XL) 25 MG 24 hr tablet Take 0.5 tablets (12.5 mg total) by mouth daily. 06/21/23   Arleen Lacer, MD  Multiple Vitamin (MULTIVITAMIN  WITH MINERALS) TABS tablet Take 1 tablet by mouth in the morning.    [provider]  ondansetron  (ZOFRAN ) 4 MG tablet Take 1 tablet (4 mg total) by mouth every 8 (eight) hours as needed for nausea, vomiting or refractory nausea / vomiting. 10/01/23 09/30/24  Winston Hawking, MD  oxyCODONE  (ROXICODONE ) 5 MG immediate release tablet Take 1-2 tablets (5-10 mg total) by mouth every 4 (four) hours as needed for severe pain (pain score 7-10). 10/01/23   Winston Hawking, MD  rosuvastatin  (CRESTOR ) 20 MG tablet Take 1 tablet (20 mg total) by mouth daily. Patient taking differently: Take 20 mg by mouth every evening. 06/21/23   Arleen Lacer, MD  tamsulosin  (FLOMAX ) 0.4 MG CAPS capsule Take 1 capsule (0.4 mg total) by mouth daily after supper. 11/17/21   Kenith Payer, MD  valsartan  (DIOVAN ) 320 MG tablet TAKE 1 TABLET BY MOUTH EVERY DAY 01/14/23   Arleen Lacer, MD      Allergies    Codeine and Latex    Review of Systems   Review of Systems  Gastrointestinal:  Positive for vomiting.    Physical Exam Updated Vital Signs BP 132/84   Pulse (!) 112   Temp 98.6 F (37 C) (Oral)   Resp (!) 26   Wt 94.3 kg   SpO2 93%   BMI 30.72 kg/m  Physical Exam Vitals and nursing note reviewed.  Constitutional:      Appearance: He is ill-appearing and toxic-appearing.  Eyes:     Extraocular Movements: Extraocular movements intact.     Pupils: Pupils are equal, round, and reactive to light.  Cardiovascular:     Rate and Rhythm: Tachycardia present.     Heart sounds: Normal heart sounds.  Pulmonary:     Breath sounds: Normal breath sounds.  Abdominal:     General: There is distension.     Tenderness: There is abdominal tenderness (generalized).     Comments: Severe abdominal distension, umbilical hernia, diffusely tender to palpation  Skin:    General: Skin is warm and dry.     Coloration: Skin is pale.  Neurological:     Mental Status: He is alert and oriented to person, place, and time.      ED Results / Procedures / Treatments   Labs (all labs ordered are listed, but only abnormal results are displayed) Labs Reviewed  COMPREHENSIVE METABOLIC PANEL WITH GFR - Abnormal; Notable for the following components:      Result Value   Sodium 134 (*)    Potassium 3.1 (*)    Chloride 86 (*)    Glucose, Bld 196 (*)    BUN 40 (*)    Creatinine, Ser 1.45 (*)    AST 55 (*)    ALT 109 (*)    Alkaline Phosphatase 195 (*)    Total Bilirubin 1.8 (*)    GFR, Estimated 50 (*)    Anion gap 17 (*)    All other components within normal limits  LACTIC ACID, PLASMA - Abnormal; Notable for the following components:   Lactic Acid, Venous 2.1 (*)    All other components within normal limits  CBC WITH DIFFERENTIAL/PLATELET - Abnormal; Notable for the following components:   RBC 4.01 (*)    Hemoglobin 12.0 (*)    HCT 35.3 (*)    Platelets 551 (*)    Lymphs Abs 0.6 (*)    All other components within normal limits  PROTIME-INR - Abnormal; Notable for the following components:   Prothrombin Time 15.9 (*)    All other components within normal limits  TROPONIN T, HIGH SENSITIVITY - Abnormal; Notable for the following components:   Troponin T High Sensitivity 54 (*)    All other components within normal limits  RESP PANEL BY RT-PCR (RSV, FLU A&B, COVID)  RVPGX2  CULTURE, BLOOD (ROUTINE X 2)  CULTURE, BLOOD (ROUTINE X 2) W REFLEX TO ID PANEL  LACTIC ACID, PLASMA  URINALYSIS, W/ REFLEX TO CULTURE (INFECTION SUSPECTED)  MAGNESIUM  I-STAT VENOUS BLOOD GAS, ED  TROPONIN T, HIGH SENSITIVITY    EKG None  Radiology No results found.  Procedures .Critical Care  Performed by: Kendrick Pax, PA-C Authorized by: Kendrick Pax, PA-C   Critical care provider statement:    Critical care time (minutes):  40   Critical care time was exclusive of:  Separately billable procedures and treating other patients and teaching time   Critical care was necessary to treat or prevent imminent or  life-threatening deterioration of the following conditions:  Renal failure, cardiac failure and dehydration   Critical care was time spent personally by me on the following activities:  Ordering and performing treatments and interventions, development of treatment plan with patient or surrogate, ordering and review of laboratory studies, discussions with consultants, ordering and review of radiographic studies, pulse oximetry, evaluation of patient's response to treatment, re-evaluation of patient's condition and examination of  patient   I assumed direction of critical care for this patient from another provider in my specialty: no     Care discussed with: admitting provider       Medications Ordered in ED Medications  lactated ringers  infusion ( Intravenous New Bag/Given 10/09/23 2220)  lactated ringers  bolus 1,000 mL (0 mLs Intravenous Stopped 10/09/23 2331)    And  lactated ringers  bolus 1,000 mL (0 mLs Intravenous Stopped 10/09/23 2331)    And  lactated ringers  bolus 1,000 mL (1,000 mLs Intravenous Not Given 10/09/23 2332)  vancomycin (VANCOCIN) IVPB 1000 mg/200 mL premix (0 mg Intravenous Stopped 10/09/23 2359)    And  vancomycin (VANCOCIN) 500 mg in sodium chloride  0.9 % 100 mL IVPB (500 mg Intravenous New Bag/Given 10/09/23 2355)  lactated ringers  bolus 1,000 mL (1,000 mLs Intravenous Not Given 10/09/23 2332)  amiodarone (NEXTERONE PREMIX) 360-4.14 MG/200ML-% (1.8 mg/mL) IV infusion (60 mg/hr Intravenous New Bag/Given 10/09/23 2343)  amiodarone (NEXTERONE PREMIX) 360-4.14 MG/200ML-% (1.8 mg/mL) IV infusion (has no administration in time range)  potassium chloride 10 mEq in 100 mL IVPB (10 mEq Intravenous New Bag/Given 10/09/23 2356)  ondansetron  (ZOFRAN ) injection 4 mg (4 mg Intravenous Given 10/09/23 2220)  lactated ringers  bolus 1,000 mL (0 mLs Intravenous Stopped 10/09/23 2330)  piperacillin-tazobactam (ZOSYN) IVPB 3.375 g (0 g Intravenous Stopped 10/09/23 2330)  iohexol (OMNIPAQUE) 300 MG/ML  solution 100 mL (100 mLs Intravenous Contrast Given 10/09/23 2246)  amiodarone (NEXTERONE) 1.5 mg/mL IV bolus only 150 mg (0 mg Intravenous Stopped 10/09/23 2334)  ondansetron  (ZOFRAN ) injection 4 mg (4 mg Intravenous Given 10/09/23 2339)    ED Course/ Medical Decision Making/ A&P                                 Medical Decision Making This patient presents to the ED for concern of cough/nausea/vomiting/abdominal distension, this involves an extensive number of treatment options, and is a complaint that carries with it a high risk of complications and morbidity.  The differential diagnosis includes sepsis, SBO/LBO, pneumonia, UTI, PE, ACS.    Co morbidities that complicate the patient evaluation  History of CAD/STEMI, recent left knee replacement, hypertension/dyslipidemia, T2DM   Additional history obtained:  Additional history obtained from record review External records from outside source obtained and reviewed including recent postoperative discharge summary   Lab Tests:  I Ordered, and personally interpreted labs.  The pertinent results include: Initial troponin of 54, CBC without leukocytosis, notable for thrombocytosis.  PT INR elevated.  CMP notable for mild hyponatremia/hypokalemia, hyperglycemia, elevated creatinine/BUN, transaminitis with ALT>AST , elevated anion gap.  Viral respiratory panel negative.  Initial lactic of 2.1.   Imaging Studies ordered:  I ordered imaging studies including CT chest/abdomen/pelvis  I independently visualized and interpreted imaging which showed   CT of the chest: No acute abnormality noted.   CT of the abdomen and pelvis:   Nonobstructing right renal stone.   Prominent prostate extending into the inferior aspect of the bladder. 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   Diverticulosis without diverticulitis.   Fluid-filled loops of large and small bowel without definitive obstructive change likely related to  a diarrheal state.  I agree with the radiologist interpretation  CTA PE study: pending   Cardiac Monitoring: / EKG:  The patient was maintained on a cardiac monitor.  I personally viewed and interpreted the cardiac monitored which showed an underlying rhythm of: tachycardia  with multiple/frequent PVC's, RBBB and LAFB   Consultations Obtained:  Dr. Florie Husband requested consultation with the intensivist,  and discussed lab and imaging findings as well as pertinent plan - they recommend: Admission to the intensive care unit for further evaluation and management of his condition   Problem List / ED Course / Critical interventions / Medication management  IV LR boluses initiated in line with emerging sepsis protocols I ordered medication including Vanc/Zosyn  for presumed underlying infectious process, Zofran  for nausea, amiodarone initiated by Dr. Florie Husband for PVC's/tachycardia, IV potassium given for hypokalemia Reevaluation of the patient after these medicines showed that the patient stayed the same I have reviewed the patients home medicines and have made adjustments as needed   Test / Admission - Considered:  Patient is septic appearing upon initial examination, notable for abdominal distention, tachycardia, hypoxia, multiple/frequent PVCs noted on cardiac monitor.  He is afebrile.  Given patient's appearance with significant cardiac history (CAD/STEMI) and recent surgery x 1 week ago, initiated septic workup due to concern for underlying infectious state.  Patient was started on vancomycin and Zosyn, as well as rehydrated with LR fluid boluses.  Zofran  given for nausea.  CT chest/abdomen/pelvis as above.  Labs as above.  Discussed case in depth with Dr. Florie Husband who initiated IV amiodarone due to patient's frequent PVCs/tachycardia, concern that patient symptoms are likely cardiac in origin, may be reflective of ventricular tachycardia however this rhythm is not sustained. Dr. Florie Husband spoke  with the intensivist who agrees that this patient is appropriate for ICU admission.  Staffed with Dr. Florie Husband    Amount and/or Complexity of Data Reviewed External Data Reviewed: notes. Labs: ordered. Radiology: ordered. ECG/medicine tests: ordered.  Risk Prescription drug management.           Final Clinical Impression(s) / ED Diagnoses Final diagnoses:  Acute kidney injury (HCC)  Transaminitis  Ventricular tachyarrhythmia (HCC)  Multifocal PVCs  Hypoxia  Lactic acidosis    Rx / DC Orders ED Discharge Orders     None         Kendrick Pax, PA-C 10/10/23 0001    Afton Horse T, DO 10/10/23 0005

## 2023-10-09 NOTE — ED Triage Notes (Addendum)
 Pt POV reporting fatigue, vomiting, and chills since yesterday, knee replacement surgery Friday. Also endorsing SOB since Monday.

## 2023-10-09 NOTE — ED Provider Notes (Incomplete)
 [place] EMERGENCY DEPARTMENT AT [facility] Provider Note   CSN: [id number] Arrival date & time: 10/09/23  2125     History {Add pertinent medical, surgical, social history, OB history to HPI:1} Chief Complaint  Patient presents with  . Fatigue  . Emesis    Chase Combs is a 78 y.o. male.  78 year old male presenting with nausea/vomiting/abdominal distention-cough.  Per patient's wife he has been experiencing nausea and vomiting for 1 day, "he is not acting like himself", he had surgery on his left knee last Friday May 9th, he has not had a solid bowel movement since this time.  Patient has history of CAD/STEMI, on aspirin .  Patient complains of chest pain, shortness of breath, it is unknown if patient has been febrile at home.   Emesis      Home Medications Prior to Admission medications   Medication Sig Start Date End Date Taking? Authorizing Provider  amLODipine  (NORVASC ) 10 MG tablet TAKE 1 TABLET BY MOUTH EVERY DAY 05/04/23   Arleen Lacer, MD  aspirin  EC 81 MG tablet Take 1 tablet (81 mg total) by mouth in the morning and at bedtime. 10/01/23 10/31/23  Winston Hawking, MD  Cannabidiol  POWD Apply 1 Application topically 3 (three) times daily as needed (knee pain.). CBD cream    [provider]  furosemide  (LASIX ) 20 MG tablet Take 1 tablet (20 mg total) by mouth daily. Patient not taking: Reported on 08/24/2023 06/21/23   Arleen Lacer, MD  hydrochlorothiazide  (HYDRODIURIL ) 25 MG tablet Take 25 mg by mouth daily.    [provider]  ibuprofen  (ADVIL ) 200 MG tablet Take 400 mg by mouth every 8 (eight) hours as needed (pain).    [provider]  methocarbamol  (ROBAXIN ) 500 MG tablet Take 1 tablet (500 mg total) by mouth every 8 (eight) hours as needed for muscle spasms. 10/01/23   Winston Hawking, MD  metoprolol  succinate (TOPROL -XL) 25 MG 24 hr tablet Take 0.5 tablets (12.5 mg total) by mouth daily. 06/21/23   Arleen Lacer, MD  Multiple  Vitamin (MULTIVITAMIN WITH MINERALS) TABS tablet Take 1 tablet by mouth in the morning.    [provider]  ondansetron  (ZOFRAN ) 4 MG tablet Take 1 tablet (4 mg total) by mouth every 8 (eight) hours as needed for nausea, vomiting or refractory nausea / vomiting. 10/01/23 09/30/24  Winston Hawking, MD  oxyCODONE  (ROXICODONE ) 5 MG immediate release tablet Take 1-2 tablets (5-10 mg total) by mouth every 4 (four) hours as needed for severe pain (pain score 7-10). 10/01/23   Winston Hawking, MD  rosuvastatin  (CRESTOR ) 20 MG tablet Take 1 tablet (20 mg total) by mouth daily. Patient taking differently: Take 20 mg by mouth every evening. 06/21/23   Arleen Lacer, MD  tamsulosin  (FLOMAX ) 0.4 MG CAPS capsule Take 1 capsule (0.4 mg total) by mouth daily after supper. 11/17/21   Kenith Payer, MD  valsartan  (DIOVAN ) 320 MG tablet TAKE 1 TABLET BY MOUTH EVERY DAY 01/14/23   Arleen Lacer, MD      Allergies    Codeine and Latex    Review of Systems   Review of Systems  Gastrointestinal:  Positive for vomiting.    Physical Exam Updated Vital Signs BP 132/84   Pulse (!) 112   Temp 98.6 F (37 C) (Oral)   Resp (!) 26   Wt 94.3 kg   SpO2 93%   BMI 30.72 kg/m  Physical Exam Vitals and nursing note reviewed.  Constitutional:  Appearance: He is ill-appearing and toxic-appearing.  Eyes:     Extraocular Movements: Extraocular movements intact.     Pupils: Pupils are equal, round, and reactive to light.  Cardiovascular:     Rate and Rhythm: Tachycardia present.     Heart sounds: Normal heart sounds.  Pulmonary:     Breath sounds: Normal breath sounds.  Abdominal:     General: There is distension.     Comments: Severe abdominal distension, umbilical hernia, diffusely tender to palpation  Skin:    General: Skin is warm and dry.     Coloration: Skin is pale.  Neurological:     Mental Status: He is alert and oriented to person, place, and time.     ED Results / Procedures /  Treatments   Labs (all labs ordered are listed, but only abnormal results are displayed) Labs Reviewed  COMPREHENSIVE METABOLIC PANEL WITH GFR - Abnormal; Notable for the following components:      Result Value   Sodium 134 (*)    Potassium 3.1 (*)    Chloride 86 (*)    Glucose, Bld 196 (*)    BUN 40 (*)    Creatinine, Ser 1.45 (*)    AST 55 (*)    ALT 109 (*)    Alkaline Phosphatase 195 (*)    Total Bilirubin 1.8 (*)    GFR, Estimated 50 (*)    Anion gap 17 (*)    All other components within normal limits  CBC WITH DIFFERENTIAL/PLATELET - Abnormal; Notable for the following components:   RBC 4.01 (*)    Hemoglobin 12.0 (*)    HCT 35.3 (*)    Platelets 551 (*)    All other components within normal limits  PROTIME-INR - Abnormal; Notable for the following components:   Prothrombin Time 15.9 (*)    All other components within normal limits  TROPONIN T, HIGH SENSITIVITY - Abnormal; Notable for the following components:   Troponin T High Sensitivity 54 (*)    All other components within normal limits  CULTURE, BLOOD (ROUTINE X 2)  RESP PANEL BY RT-PCR (RSV, FLU A&B, COVID)  RVPGX2  CULTURE, BLOOD (ROUTINE X 2) W REFLEX TO ID PANEL  LACTIC ACID, PLASMA  LACTIC ACID, PLASMA  URINALYSIS, W/ REFLEX TO CULTURE (INFECTION SUSPECTED)    EKG None  Radiology No results found.  Procedures Procedures  {Document cardiac monitor, telemetry assessment procedure when appropriate:1}  Medications Ordered in ED Medications  ondansetron  (ZOFRAN ) injection 4 mg (has no administration in time range)  lactated ringers  bolus 1,000 mL (has no administration in time range)  lactated ringers  infusion (has no administration in time range)  vancomycin (VANCOCIN) IVPB 1000 mg/200 mL premix (has no administration in time range)  piperacillin-tazobactam (ZOSYN) IVPB 3.375 g (has no administration in time range)  lactated ringers  bolus 1,000 mL (has no administration in time range)    And   lactated ringers  bolus 1,000 mL (has no administration in time range)    And  lactated ringers  bolus 1,000 mL (has no administration in time range)    ED Course/ Medical Decision Making/ A&P   {   Click here for ABCD2, HEART and other calculatorsREFRESH Note before signing :1}                              Medical Decision Making This patient presents to the ED for concern of cough/nausea/vomiting/abdominal distension, this involves an  extensive number of treatment options, and is a complaint that carries with it a high risk of complications and morbidity.  The differential diagnosis includes sepsis, SBO/LBO, pneumonia, UTI, PE, ACS.    Co morbidities that complicate the patient evaluation  History of CAD/STEMI, recent left knee replacement, hypertension/dyslipidemia   Additional history obtained:  Additional history obtained from record review External records from outside source obtained and reviewed including recent postoperative discharge summary   Lab Tests:  I Ordered, and personally interpreted labs.  The pertinent results include: Initial troponin of 54, CBC without leukocytosis, notable for thrombocytosis.  PT INR elevated.  CMP notable for mild hyponatremia/hypokalemia, hyperglycemia, elevated creatinine/BUN, transaminitis with ALT>AST , elevated anion gap.   Imaging Studies ordered:  I ordered imaging studies including ***  I independently visualized and interpreted imaging which showed *** I agree with the radiologist interpretation   Cardiac Monitoring: / EKG:  The patient was maintained on a cardiac monitor.  I personally viewed and interpreted the cardiac monitored which showed an underlying rhythm of: ***   Consultations Obtained:  I requested consultation with the ***,  and discussed lab and imaging findings as well as pertinent plan - they recommend: ***   Problem List / ED Course / Critical interventions / Medication management  *** I ordered  medication including ***  for ***  Reevaluation of the patient after these medicines showed that the patient {resolved/improved/worsened:23923::"improved"} I have reviewed the patients home medicines and have made adjustments as needed   Social Determinants of Health:  ***   Test / Admission - Considered:  Patient is septic appearing upon initial examination, notable for abdominal distention, tachycardia, afebrile.   Staffed with Dr. Florie Husband    Amount and/or Complexity of Data Reviewed Labs: ordered. Radiology: ordered.  Risk Prescription drug management.   ***  {Document critical care time when appropriate:1} {Document review of labs and clinical decision tools ie heart score, Chads2Vasc2 etc:1}  {Document your independent review of radiology images, and any outside records:1} {Document your discussion with family members, caretakers, and with consultants:1} {Document social determinants of health affecting pt's care:1} {Document your decision making why or why not admission, treatments were needed:1} Final Clinical Impression(s) / ED Diagnoses Final diagnoses:  None    Rx / DC Orders ED Discharge Orders     None

## 2023-10-10 ENCOUNTER — Emergency Department (HOSPITAL_BASED_OUTPATIENT_CLINIC_OR_DEPARTMENT_OTHER)

## 2023-10-10 DIAGNOSIS — K449 Diaphragmatic hernia without obstruction or gangrene: Secondary | ICD-10-CM | POA: Diagnosis not present

## 2023-10-10 DIAGNOSIS — R410 Disorientation, unspecified: Secondary | ICD-10-CM | POA: Diagnosis not present

## 2023-10-10 DIAGNOSIS — K9189 Other postprocedural complications and disorders of digestive system: Secondary | ICD-10-CM | POA: Diagnosis not present

## 2023-10-10 DIAGNOSIS — R918 Other nonspecific abnormal finding of lung field: Secondary | ICD-10-CM | POA: Diagnosis not present

## 2023-10-10 DIAGNOSIS — E878 Other disorders of electrolyte and fluid balance, not elsewhere classified: Secondary | ICD-10-CM

## 2023-10-10 DIAGNOSIS — J9601 Acute respiratory failure with hypoxia: Secondary | ICD-10-CM

## 2023-10-10 DIAGNOSIS — K429 Umbilical hernia without obstruction or gangrene: Secondary | ICD-10-CM | POA: Diagnosis not present

## 2023-10-10 DIAGNOSIS — E8809 Other disorders of plasma-protein metabolism, not elsewhere classified: Secondary | ICD-10-CM | POA: Diagnosis not present

## 2023-10-10 DIAGNOSIS — E876 Hypokalemia: Secondary | ICD-10-CM | POA: Diagnosis not present

## 2023-10-10 DIAGNOSIS — K648 Other hemorrhoids: Secondary | ICD-10-CM | POA: Diagnosis not present

## 2023-10-10 DIAGNOSIS — T85518A Breakdown (mechanical) of other gastrointestinal prosthetic devices, implants and grafts, initial encounter: Secondary | ICD-10-CM | POA: Diagnosis not present

## 2023-10-10 DIAGNOSIS — Z6831 Body mass index (BMI) 31.0-31.9, adult: Secondary | ICD-10-CM | POA: Diagnosis not present

## 2023-10-10 DIAGNOSIS — R933 Abnormal findings on diagnostic imaging of other parts of digestive tract: Secondary | ICD-10-CM | POA: Diagnosis not present

## 2023-10-10 DIAGNOSIS — Z452 Encounter for adjustment and management of vascular access device: Secondary | ICD-10-CM | POA: Diagnosis not present

## 2023-10-10 DIAGNOSIS — G4700 Insomnia, unspecified: Secondary | ICD-10-CM | POA: Diagnosis present

## 2023-10-10 DIAGNOSIS — K567 Ileus, unspecified: Secondary | ICD-10-CM | POA: Diagnosis not present

## 2023-10-10 DIAGNOSIS — R197 Diarrhea, unspecified: Secondary | ICD-10-CM | POA: Diagnosis not present

## 2023-10-10 DIAGNOSIS — N170 Acute kidney failure with tubular necrosis: Secondary | ICD-10-CM | POA: Diagnosis not present

## 2023-10-10 DIAGNOSIS — D649 Anemia, unspecified: Secondary | ICD-10-CM | POA: Diagnosis not present

## 2023-10-10 DIAGNOSIS — J9811 Atelectasis: Secondary | ICD-10-CM | POA: Diagnosis not present

## 2023-10-10 DIAGNOSIS — E871 Hypo-osmolality and hyponatremia: Secondary | ICD-10-CM | POA: Diagnosis not present

## 2023-10-10 DIAGNOSIS — R112 Nausea with vomiting, unspecified: Secondary | ICD-10-CM | POA: Diagnosis not present

## 2023-10-10 DIAGNOSIS — R7989 Other specified abnormal findings of blood chemistry: Secondary | ICD-10-CM | POA: Diagnosis not present

## 2023-10-10 DIAGNOSIS — I493 Ventricular premature depolarization: Secondary | ICD-10-CM | POA: Diagnosis not present

## 2023-10-10 DIAGNOSIS — Z8616 Personal history of COVID-19: Secondary | ICD-10-CM | POA: Diagnosis not present

## 2023-10-10 DIAGNOSIS — I2510 Atherosclerotic heart disease of native coronary artery without angina pectoris: Secondary | ICD-10-CM | POA: Diagnosis not present

## 2023-10-10 DIAGNOSIS — E785 Hyperlipidemia, unspecified: Secondary | ICD-10-CM | POA: Diagnosis present

## 2023-10-10 DIAGNOSIS — R509 Fever, unspecified: Secondary | ICD-10-CM | POA: Diagnosis not present

## 2023-10-10 DIAGNOSIS — E46 Unspecified protein-calorie malnutrition: Secondary | ICD-10-CM | POA: Diagnosis not present

## 2023-10-10 DIAGNOSIS — I700 Atherosclerosis of aorta: Secondary | ICD-10-CM | POA: Diagnosis not present

## 2023-10-10 DIAGNOSIS — J690 Pneumonitis due to inhalation of food and vomit: Secondary | ICD-10-CM | POA: Diagnosis not present

## 2023-10-10 DIAGNOSIS — Z4682 Encounter for fitting and adjustment of non-vascular catheter: Secondary | ICD-10-CM | POA: Diagnosis not present

## 2023-10-10 DIAGNOSIS — R638 Other symptoms and signs concerning food and fluid intake: Secondary | ICD-10-CM | POA: Diagnosis not present

## 2023-10-10 DIAGNOSIS — A09 Infectious gastroenteritis and colitis, unspecified: Secondary | ICD-10-CM

## 2023-10-10 DIAGNOSIS — K5730 Diverticulosis of large intestine without perforation or abscess without bleeding: Secondary | ICD-10-CM | POA: Diagnosis not present

## 2023-10-10 DIAGNOSIS — N400 Enlarged prostate without lower urinary tract symptoms: Secondary | ICD-10-CM | POA: Diagnosis present

## 2023-10-10 DIAGNOSIS — K529 Noninfective gastroenteritis and colitis, unspecified: Secondary | ICD-10-CM | POA: Diagnosis not present

## 2023-10-10 DIAGNOSIS — I10 Essential (primary) hypertension: Secondary | ICD-10-CM | POA: Diagnosis not present

## 2023-10-10 DIAGNOSIS — R140 Abdominal distension (gaseous): Secondary | ICD-10-CM | POA: Diagnosis not present

## 2023-10-10 DIAGNOSIS — R188 Other ascites: Secondary | ICD-10-CM | POA: Diagnosis not present

## 2023-10-10 DIAGNOSIS — R0902 Hypoxemia: Secondary | ICD-10-CM | POA: Diagnosis not present

## 2023-10-10 DIAGNOSIS — I252 Old myocardial infarction: Secondary | ICD-10-CM | POA: Diagnosis not present

## 2023-10-10 DIAGNOSIS — I472 Ventricular tachycardia, unspecified: Secondary | ICD-10-CM | POA: Diagnosis not present

## 2023-10-10 DIAGNOSIS — D72829 Elevated white blood cell count, unspecified: Secondary | ICD-10-CM | POA: Diagnosis not present

## 2023-10-10 DIAGNOSIS — E43 Unspecified severe protein-calorie malnutrition: Secondary | ICD-10-CM | POA: Diagnosis not present

## 2023-10-10 DIAGNOSIS — E872 Acidosis, unspecified: Secondary | ICD-10-CM | POA: Diagnosis not present

## 2023-10-10 DIAGNOSIS — Y838 Other surgical procedures as the cause of abnormal reaction of the patient, or of later complication, without mention of misadventure at the time of the procedure: Secondary | ICD-10-CM | POA: Diagnosis present

## 2023-10-10 DIAGNOSIS — K560 Paralytic ileus: Secondary | ICD-10-CM | POA: Diagnosis not present

## 2023-10-10 DIAGNOSIS — Z7982 Long term (current) use of aspirin: Secondary | ICD-10-CM | POA: Diagnosis not present

## 2023-10-10 DIAGNOSIS — N179 Acute kidney failure, unspecified: Secondary | ICD-10-CM | POA: Diagnosis not present

## 2023-10-10 LAB — LACTIC ACID, PLASMA: Lactic Acid, Venous: 1.8 mmol/L (ref 0.5–1.9)

## 2023-10-10 LAB — GLUCOSE, CAPILLARY
Glucose-Capillary: 103 mg/dL — ABNORMAL HIGH (ref 70–99)
Glucose-Capillary: 115 mg/dL — ABNORMAL HIGH (ref 70–99)
Glucose-Capillary: 124 mg/dL — ABNORMAL HIGH (ref 70–99)
Glucose-Capillary: 139 mg/dL — ABNORMAL HIGH (ref 70–99)
Glucose-Capillary: 155 mg/dL — ABNORMAL HIGH (ref 70–99)
Glucose-Capillary: 188 mg/dL — ABNORMAL HIGH (ref 70–99)

## 2023-10-10 LAB — URINALYSIS, W/ REFLEX TO CULTURE (INFECTION SUSPECTED)
Bacteria, UA: NONE SEEN
Bilirubin Urine: NEGATIVE
Glucose, UA: NEGATIVE mg/dL
Hgb urine dipstick: NEGATIVE
Ketones, ur: NEGATIVE mg/dL
Leukocytes,Ua: NEGATIVE
Nitrite: NEGATIVE
Protein, ur: 30 mg/dL — AB
Specific Gravity, Urine: >1.046 — ABNORMAL HIGH (ref 1.005–1.030)
pH: 5 (ref 5.0–8.0)

## 2023-10-10 LAB — GASTROINTESTINAL PANEL BY PCR, STOOL (REPLACES STOOL CULTURE)

## 2023-10-10 LAB — HEPATIC FUNCTION PANEL
ALT: 73 U/L — ABNORMAL HIGH (ref 0–44)
AST: 36 U/L (ref 15–41)
Albumin: 2.7 g/dL — ABNORMAL LOW (ref 3.5–5.0)
Alkaline Phosphatase: 116 U/L (ref 38–126)
Bilirubin, Direct: 0.6 mg/dL — ABNORMAL HIGH (ref 0.0–0.2)
Indirect Bilirubin: 0.9 mg/dL (ref 0.3–0.9)
Total Bilirubin: 1.5 mg/dL — ABNORMAL HIGH (ref 0.0–1.2)
Total Protein: 6 g/dL — ABNORMAL LOW (ref 6.5–8.1)

## 2023-10-10 LAB — POCT I-STAT 7, (LYTES, BLD GAS, ICA,H+H)
Acid-Base Excess: 8 mmol/L — ABNORMAL HIGH (ref 0.0–2.0)
Bicarbonate: 30.9 mmol/L — ABNORMAL HIGH (ref 20.0–28.0)
Calcium, Ion: 1.07 mmol/L — ABNORMAL LOW (ref 1.15–1.40)
HCT: 32 % — ABNORMAL LOW (ref 39.0–52.0)
Hemoglobin: 10.9 g/dL — ABNORMAL LOW (ref 13.0–17.0)
O2 Saturation: 100 %
Patient temperature: 99.2
Potassium: 2.5 mmol/L — CL (ref 3.5–5.1)
Sodium: 132 mmol/L — ABNORMAL LOW (ref 135–145)
TCO2: 32 mmol/L (ref 22–32)
pCO2 arterial: 37.2 mmHg (ref 32–48)
pH, Arterial: 7.529 — ABNORMAL HIGH (ref 7.35–7.45)
pO2, Arterial: 160 mmHg — ABNORMAL HIGH (ref 83–108)

## 2023-10-10 LAB — BASIC METABOLIC PANEL WITH GFR
Anion gap: 16 — ABNORMAL HIGH (ref 5–15)
BUN: 36 mg/dL — ABNORMAL HIGH (ref 8–23)
CO2: 28 mmol/L (ref 22–32)
Calcium: 8.3 mg/dL — ABNORMAL LOW (ref 8.9–10.3)
Chloride: 89 mmol/L — ABNORMAL LOW (ref 98–111)
Creatinine, Ser: 1.14 mg/dL (ref 0.61–1.24)
GFR, Estimated: >60 mL/min (ref >60)
Glucose, Bld: 175 mg/dL — ABNORMAL HIGH (ref 70–99)
Potassium: 3 mmol/L — ABNORMAL LOW (ref 3.5–5.1)
Sodium: 133 mmol/L — ABNORMAL LOW (ref 135–145)

## 2023-10-10 LAB — I-STAT VENOUS BLOOD GAS, ED
Acid-Base Excess: 8 mmol/L — ABNORMAL HIGH (ref 0.0–2.0)
Bicarbonate: 31.9 mmol/L — ABNORMAL HIGH (ref 20.0–28.0)
Calcium, Ion: 1.02 mmol/L — ABNORMAL LOW (ref 1.15–1.40)
HCT: 33 % — ABNORMAL LOW (ref 39.0–52.0)
Hemoglobin: 11.2 g/dL — ABNORMAL LOW (ref 13.0–17.0)
O2 Saturation: 61 %
Patient temperature: 98.6
Potassium: 2.8 mmol/L — ABNORMAL LOW (ref 3.5–5.1)
Sodium: 130 mmol/L — ABNORMAL LOW (ref 135–145)
TCO2: 33 mmol/L — ABNORMAL HIGH (ref 22–32)
pCO2, Ven: 42.9 mmHg — ABNORMAL LOW (ref 44–60)
pH, Ven: 7.479 — ABNORMAL HIGH (ref 7.25–7.43)
pO2, Ven: 29 mmHg — CL (ref 32–45)

## 2023-10-10 LAB — TROPONIN T, HIGH SENSITIVITY: Troponin T High Sensitivity: 38 ng/L — ABNORMAL HIGH (ref <19)

## 2023-10-10 LAB — PROCALCITONIN: Procalcitonin: 0.76 ng/mL

## 2023-10-10 LAB — ACETAMINOPHEN LEVEL: Acetaminophen (Tylenol), Serum: <10 ug/mL — ABNORMAL LOW (ref 10–30)

## 2023-10-10 LAB — HEPATITIS PANEL, ACUTE
HCV Ab: NONREACTIVE
Hep A IgM: NONREACTIVE
Hep B C IgM: NONREACTIVE
Hepatitis B Surface Ag: NONREACTIVE

## 2023-10-10 LAB — BRAIN NATRIURETIC PEPTIDE: B Natriuretic Peptide: 373.7 pg/mL — ABNORMAL HIGH (ref 0.0–100.0)

## 2023-10-10 LAB — TROPONIN I (HIGH SENSITIVITY)
Troponin I (High Sensitivity): 55 ng/L — ABNORMAL HIGH (ref <18)
Troponin I (High Sensitivity): 63 ng/L — ABNORMAL HIGH

## 2023-10-10 LAB — C DIFFICILE QUICK SCREEN W PCR REFLEX
C Diff antigen: NEGATIVE
C Diff interpretation: NOT DETECTED
C Diff toxin: NEGATIVE

## 2023-10-10 LAB — MRSA NEXT GEN BY PCR, NASAL: MRSA by PCR Next Gen: NOT DETECTED

## 2023-10-10 LAB — MAGNESIUM: Magnesium: 2.4 mg/dL (ref 1.7–2.4)

## 2023-10-10 LAB — SALICYLATE LEVEL: Salicylate Lvl: <7 mg/dL — ABNORMAL LOW (ref 7.0–30.0)

## 2023-10-10 MED ORDER — DOCUSATE SODIUM 100 MG PO CAPS: 100.0000 mg | ORAL_CAPSULE | Freq: Two times a day (BID) | ORAL | Status: DC | PRN

## 2023-10-10 MED ORDER — ORAL CARE MOUTH RINSE: 15.0000 mL | OROMUCOSAL | Status: DC | PRN

## 2023-10-10 MED ORDER — INSULIN ASPART 100 UNIT/ML IJ SOLN
0.0000 [IU] | INTRAMUSCULAR | Status: DC
  Administered 2023-10-10: 3 [IU] via SUBCUTANEOUS
  Administered 2023-10-10 (×2): 2 [IU] via SUBCUTANEOUS
  Administered 2023-10-10: 3 [IU] via SUBCUTANEOUS
  Administered 2023-10-11 – 2023-10-12 (×3): 2 [IU] via SUBCUTANEOUS
  Administered 2023-10-13: 3 [IU] via SUBCUTANEOUS
  Administered 2023-10-13: 1 [IU] via SUBCUTANEOUS
  Administered 2023-10-13: 3 [IU] via SUBCUTANEOUS
  Administered 2023-10-13: 2 [IU] via SUBCUTANEOUS
  Administered 2023-10-14 (×2): 5 [IU] via SUBCUTANEOUS

## 2023-10-10 MED ORDER — CHLORHEXIDINE GLUCONATE CLOTH 2 % EX PADS
6.0000 | MEDICATED_PAD | Freq: Every day | CUTANEOUS | Status: DC
  Administered 2023-10-10 – 2023-10-13 (×4): 6 via TOPICAL

## 2023-10-10 MED ORDER — ORAL CARE MOUTH RINSE
15.0000 mL | OROMUCOSAL | Status: DC
  Administered 2023-10-10 – 2023-10-13 (×12): 15 mL via OROMUCOSAL

## 2023-10-10 MED ORDER — ONDANSETRON HCL 4 MG/2ML IJ SOLN
4.0000 mg | Freq: Four times a day (QID) | INTRAMUSCULAR | Status: DC | PRN
  Administered 2023-10-10: 4 mg via INTRAVENOUS
  Filled 2023-10-10 (×2): qty 2

## 2023-10-10 MED ORDER — POTASSIUM CHLORIDE 10 MEQ/100ML IV SOLN
10.0000 meq | INTRAVENOUS | Status: AC
  Administered 2023-10-10 (×5): 10 meq via INTRAVENOUS
  Filled 2023-10-10 (×5): qty 100

## 2023-10-10 MED ORDER — POLYETHYLENE GLYCOL 3350 17 G PO PACK: 17.0000 g | PACK | Freq: Every day | ORAL | Status: DC | PRN

## 2023-10-10 MED ORDER — CHLORHEXIDINE GLUCONATE CLOTH 2 % EX PADS: 6.0000 | MEDICATED_PAD | Freq: Every day | CUTANEOUS | Status: DC

## 2023-10-10 MED ORDER — ACETAMINOPHEN 325 MG PO TABS
650.0000 mg | ORAL_TABLET | Freq: Four times a day (QID) | ORAL | Status: DC | PRN
  Administered 2023-10-10 – 2023-10-11 (×4): 650 mg via ORAL
  Filled 2023-10-10 (×4): qty 2

## 2023-10-10 MED ORDER — IOHEXOL 350 MG/ML SOLN
75.0000 mL | Freq: Once | INTRAVENOUS | Status: AC | PRN
  Administered 2023-10-10: 75 mL via INTRAVENOUS

## 2023-10-10 MED ORDER — HEPARIN SODIUM (PORCINE) 5000 UNIT/ML IJ SOLN
5000.0000 [IU] | Freq: Three times a day (TID) | INTRAMUSCULAR | Status: DC
  Administered 2023-10-10 – 2023-10-27 (×51): 5000 [IU] via SUBCUTANEOUS
  Filled 2023-10-10 (×51): qty 1

## 2023-10-10 MED ORDER — ONDANSETRON HCL 4 MG/2ML IJ SOLN
4.0000 mg | Freq: Once | INTRAMUSCULAR | Status: AC
  Administered 2023-10-10: 4 mg via INTRAVENOUS
  Filled 2023-10-10: qty 2

## 2023-10-10 MED ORDER — POTASSIUM CHLORIDE 10 MEQ/100ML IV SOLN
10.0000 meq | INTRAVENOUS | Status: AC
  Administered 2023-10-10 (×4): 10 meq via INTRAVENOUS
  Filled 2023-10-10 (×4): qty 100

## 2023-10-10 MED ORDER — ACETAMINOPHEN 650 MG RE SUPP: 325.0000 mg | Freq: Four times a day (QID) | RECTAL | Status: DC | PRN

## 2023-10-10 MED ORDER — SIMETHICONE 80 MG PO CHEW
80.0000 mg | CHEWABLE_TABLET | Freq: Four times a day (QID) | ORAL | Status: DC | PRN
  Administered 2023-10-11: 80 mg via ORAL
  Filled 2023-10-10 (×2): qty 1

## 2023-10-10 MED ORDER — PIPERACILLIN-TAZOBACTAM 3.375 G IVPB
3.3750 g | Freq: Three times a day (TID) | INTRAVENOUS | Status: DC
  Administered 2023-10-10 – 2023-10-11 (×4): 3.375 g via INTRAVENOUS
  Filled 2023-10-10 (×4): qty 50

## 2023-10-10 MED ORDER — FENTANYL CITRATE PF 50 MCG/ML IJ SOSY
50.0000 ug | PREFILLED_SYRINGE | Freq: Once | INTRAMUSCULAR | Status: AC
  Administered 2023-10-10: 50 ug via INTRAVENOUS
  Filled 2023-10-10: qty 1

## 2023-10-10 NOTE — Progress Notes (Signed)
   10/10/23 0556  Therapy Vitals  Pulse Rate (!) 105  Resp (!) 31  MEWS Score/Color  MEWS Score 3  MEWS Score Color Yellow  Oxygen Therapy/Pulse Ox  O2 Device HHFNC  O2 Therapy Oxygen humidified  O2 Flow Rate (L/min) 45 L/min  FiO2 (%) 60 %  SpO2 100 %   Decreased oxygen per abg results

## 2023-10-10 NOTE — Progress Notes (Signed)
 eLink Physician-Brief Progress Note Patient Name: BLASE BECKNER DOB: 02-27-1946 MRN: [id number]   Date of Service  10/10/2023  HPI/Events of Note  78 year old male with a history of coronary artery disease status post MI who presented to an outside hospital feeling unwell for 1 week since his TKR admitted to the ICU for management of nonsustained VT and PVCs in the setting of acute kidney injury and acute hypoxemia.  Patient noted to be tachypneic, tachycardic, but saturating 99% on heated high flow.  Currently on little dose amiodarone infusion after bolus and status post broad-spectrum antibiotics.  Results consistent with transaminitis, elevated creatinine, and hypokalemia.  eICU Interventions  Continue supportive care for his likely ileus, antiemetics, and continue aggressive electrolyte repletion.  Encourage early mobility and nonopiate analgesia for both his ileus and atelectatic hypoxemia.  C. difficile pending.  DVT prophylaxis with heparin GI prophylaxis not indicated     Intervention Category Evaluation Type: New Patient Evaluation  Jewelz Kobus 10/10/2023, 3:52 AM

## 2023-10-10 NOTE — ED Notes (Signed)
 Pt refused bipap

## 2023-10-10 NOTE — Progress Notes (Signed)
 NAME:  Chase Combs, MRN:  [id number], DOB:  Nov 11, 1945, LOS: 0 ADMISSION DATE:  10/09/2023, CONSULTATION DATE:  10/10/2023 REFERRING MD:  Cris Dollar, PA-C, CHIEF COMPLAINT:  SOB  History of Present Illness:  78 y/o make with PMH significant for Left TKR on 10/01/2023 for osteoarthritis, CAD S/p MI s/p coronary stent x 2, who presented to [facility] with c/o feeling "unwell" x 1 week since his TKR.  Patient noted to have distended abdomen, Tachypnic and Tachycardiac upon arrival to ED and he was hypoxic.  A CT abdomen/pelvis and CTA for Pulmonary emboli were done.  He was noted to have frequent PVCs and possible short runs of V Tach and Amiodarone drip was started.  CT scan abd-no definitive obstruction and CTA no Pulmonary emboli.  Patient noted to have abdominal distension and hypoxia post op as well during several days of recovery with one day where he had some vomiting and PT was canceled.  Pertinent  Medical History  Abdominal hernia, Arthritis, Benign localized prostatic hyperplasia with lower urinary tract symptoms (LUTS), CAD (coronary artery disease) (05/2004), Dyslipidemia, Essential hypertension, Heart murmur, History of COVID-19 (05/2020), History of ST elevation myocardial infarction (STEMI) (02/16/2009), History of urinary retention, 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(HCC) (04/2021), OSA (obstructive sleep apnea), PONV (postoperative nausea and vomiting), Pre-diabetes, and S/p bare metal coronary artery stent.   Significant Hospital Events: Including procedures, antibiotic start and stop dates in addition to other pertinent events   [facility]  Interim History / Subjective:  N/a  Objective    Blood pressure 124/75, pulse 94, temperature 99.2 F (37.3 C), temperature source Oral, resp. rate (!) 22, weight 98.1 kg, SpO2 98%.    FiO2 (%):  [45 %-100 %] 50 %   Intake/Output Summary (Last 24 hours) at 10/10/2023 0805 Last data filed at 10/10/2023  0600 Gross per 24 hour  Intake 3285.58 ml  Output 150 ml  Net 3135.58 ml   Filed Weights   10/09/23 2211 10/10/23 0500  Weight: 94.3 kg 98.1 kg    Examination: Awake, alert, oriented Breathing non labored Breath sounds clear, no wheezes On HHFNC Abdomen distended, nontender, bowel sounds present Tachycardic, regular No peripheral edema  Labs/imaging personally reviewed     Resolved problem list   Assessment and Plan   Acute hypoxemic respiratory failure - CTPE study reviewed, negative for PE no major parenchymal changes - suspect hypoventilation from abdominal distention - will rapidly wean to [place], discussed with RT  Gastroenteritis/diarrheal illness - c. Difficile negative, stool cultures and GI panel pending - continue enteric precautions - zosyn for now  AKI Hypokalemia Hypocalcemia Hyponatremia - secondary to decreased oral intake, diarrhea - replace, repeat BMET - MIVF until able to advance diet with LR  CAD s/p CABG Frequent PVCs and NSVT HTN - exacerbated by electrolyte abnormalities - can likely stop amiodarone gtt - holding lasix , hydrochlorothiazide , valsartan  for now - may be able to fold back in BB to help with PVCs/NSVT once he starts tolerating PO   Best Practice (right click and "Reselect all SmartList Selections" daily)   Diet/type: clear liquids and advance as tolerated DVT prophylaxis prophylactic heparin  Pressure ulcer(s): N/A GI prophylaxis: N/A Lines: N/A Foley:  N/A Code Status:  full code  I spent 50 minutes in total visit time for this patient, with more than 50% spent counseling/coordinating care.  Louie Rover, MD Pulmonary and Critical Care Medicine [facility], [facility], [facility] 10/10/2023 9:22 AM Pager: see AMION  If  no response to pager, please call critical care on call (see AMION) until 7pm After 7:00 pm call Elink

## 2023-10-10 NOTE — ED Notes (Signed)
Report called to receiving RT.  ?

## 2023-10-10 NOTE — ED Provider Notes (Signed)
 Care assumed at shift change pending ICU admission for SOB, weakness, frequent and consecutive PVCs on amiodarone drip. Physical Exam  BP (!) 146/76   Pulse (!) 113   Temp 98.6 F (37 C) (Oral)   Resp (!) 31   Wt 94.3 kg   SpO2 97%   BMI 30.72 kg/m   Physical Exam  Procedures  .Critical Care  Performed by: Charmayne Cooper, MD Authorized by: Charmayne Cooper, MD   Critical care provider statement:    Critical care time (minutes):  35   Critical care time was exclusive of:  Separately billable procedures and treating other patients   Critical care was necessary to treat or prevent imminent or life-threatening deterioration of the following conditions:  Cardiac failure and respiratory failure   I assumed direction of critical care for this patient from another provider in my specialty: yes     ED Course / MDM   Clinical Course as of 10/10/23 0324  Sun Oct 10, 2023  0027 Called to bedside by RN for increased WOB on return from his repeat CT scan. He is not hypoxic but has required additional oxygen, now up to 5L. No stridor or other signs of airway compromise. He has had significant IVF volume for presumed sepsis on arrival so may have some fluid overload. He has refused BiPAP, will begin HFNC and continue to watch closely.  [CS]  0117 Trop and lactic acid improving. I personally viewed the images from radiology studies and agree with radiologist interpretation: CTA neg for PE or pulm edema.  [CS]  0239 RR improved with HFNC. He has had some abdominal discomfort from bloating. Pain meds ordered.  [CS]    Clinical Course User Index [CS] Charmayne Cooper, MD   Medical Decision Making Amount and/or Complexity of Data Reviewed Labs: ordered. Radiology: ordered.  Risk Prescription drug management. Decision regarding hospitalization.          Charmayne Cooper, MD 10/10/23 [phone]

## 2023-10-10 NOTE — H&P (Signed)
 NAME:  Chase Combs, MRN:  [id number], DOB:  12/27/1945, LOS: 0 ADMISSION DATE:  10/09/2023, CONSULTATION DATE:  10/10/2023 REFERRING MD:  Cris Dollar, PA-C, CHIEF COMPLAINT:  SOB  History of Present Illness:  78 y/o make with PMH significant for Left TKR on 10/01/2023 for osteoarthritis, CAD S/p MI s/p coronary stent x 2, who presented to [facility] with c/o feeling "unwell" x 1 week since his TKR.  Patient noted to have distended abdomen, Tachypnic and Tachycardiac upon arrival to ED and he was hypoxic.  A CT abdomen/pelvis and CTA for Pulmonary emboli were done.  He was noted to have frequent PVCs and possible short runs of V Tach and Amiodarone drip was started.  CT scan abd-no definitive obstruction and CTA no Pulmonary emboli.  Patient noted to have abdominal distension and hypoxia post op as well during several days of recovery with one day where he had some vomiting and PT was canceled.  Pertinent  Medical History  Abdominal hernia, Arthritis, Benign localized prostatic hyperplasia with lower urinary tract symptoms (LUTS), CAD (coronary artery disease) (05/2004), Dyslipidemia, Essential hypertension, Heart murmur, History of COVID-19 (05/2020), History of ST elevation myocardial infarction (STEMI) (02/16/2009), History of urinary retention, 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(HCC) (04/2021), OSA (obstructive sleep apnea), PONV (postoperative nausea and vomiting), Pre-diabetes, and S/p bare metal coronary artery stent.   Significant Hospital Events: Including procedures, antibiotic start and stop dates in addition to other pertinent events   [facility] from [facility] to ICU [facility]  Interim History / Subjective:  N/a  Objective    Blood pressure (!) 130/50, pulse (!) 105, temperature 99.2 F (37.3 C), temperature source Oral, resp. rate (!) 25, weight 94.3 kg, SpO2 100%.    FiO2 (%):  [45 %-100 %] 100 %   Intake/Output Summary (Last 24 hours) at 10/10/2023 0442 Last data filed at  10/10/2023 0400 Gross per 24 hour  Intake 3153.44 ml  Output --  Net 3153.44 ml   Filed Weights   10/09/23 2211  Weight: 94.3 kg    Examination: General: Alert and awake, mild resp distress on high flow HENT: PERRLA no icterus EOMI, mmm Lungs: CTA no wheezes no rales no rhonchi Cardiovascular: Reg s1s2 with irregularity, no gallops or rubs, no murmurs Abdomen: distended, BS very sluggish, no guarding Extremities: trace pitting LE edema, Lt>Rt, no cyanosis, clubbing Neuro: AAOx3, CN II to Xii grossly intact   Resolved problem list   Assessment and Plan  Frequent PVCs and possible non-sustained V tach Possibly from electrolyte abnormality such has hypokalemia Will replace potassium Transition off Amiodarone drip once Potassium repleted Troponins and low and flat, not suspecting ACS CTA negative for PE Abdominal distension Possible mild ileus Could also be related to electrolyte abnormalities Again-repletion of electrolytes and IV fluids Having some diarrhea-send for C diff and stool cultures Mild transaminitis-taking 800mg  Tylenol  daily Lactic acid has cleared/Anion Gap Acidosis-repeat Labs, IV fluids Diarrhea Gastroenteritis Replete electrolytes and IV hydration Again send for C diff and stool cultures Electrolyte abnormality Hypokalemia-will replace Hyponatremia Hypocalcemia AKI Repletion of electrolytes IV fluids Monitor Is/O's Monitor serum BUN/Cr Mild ATN Acute hypoxic respiratory failure Secondary to Atelectasis PE ruled out by CTA Wean high flow as possible Also, the abdominal distension playing a role on pushing up diaphragm  Best Practice (right click and "Reselect all SmartList Selections" daily)   Diet/type: NPO w/ oral meds DVT prophylaxis prophylactic heparin  Pressure ulcer(s): N/A GI prophylaxis: N/A Lines: N/A Foley:  N/A Code Status:  full code   Labs   CBC: Recent Labs  Lab 10/09/23 2200 10/10/23 0008  WBC 4.5  --   NEUTROABS  3.2  --   HGB 12.0* 11.2*  HCT 35.3* 33.0*  MCV 88.0  --   PLT 551*  --     Basic Metabolic Panel: Recent Labs  Lab 10/09/23 2200 10/10/23 0008 10/10/23 0010  NA 134* 130*  --   K 3.1* 2.8*  --   CL 86*  --   --   CO2 30  --   --   GLUCOSE 196*  --   --   BUN 40*  --   --   CREATININE 1.45*  --   --   CALCIUM  9.5  --   --   MG  --   --  2.4   GFR: Estimated Creatinine Clearance: 49.9 mL/min (A) (by C-G formula based on SCr of 1.45 mg/dL (H)). Recent Labs  Lab 10/09/23 2142 10/09/23 2200 10/09/23 2351  WBC  --  4.5  --   LATICACIDVEN 2.1*  --  1.8    Liver Function Tests: Recent Labs  Lab 10/09/23 2200  AST 55*  ALT 109*  ALKPHOS 195*  BILITOT 1.8*  PROT 7.5  ALBUMIN 3.8   No results for input(s): "LIPASE", "AMYLASE" in the last 168 hours. No results for input(s): "AMMONIA" in the last 168 hours.  ABG    Component Value Date/Time   HCO3 31.9 (H) 10/10/2023 0008   TCO2 33 (H) 10/10/2023 0008   ACIDBASEDEF 1.0 12/08/2006 1859   O2SAT 61 10/10/2023 0008     Coagulation Profile: Recent Labs  Lab 10/09/23 2200  INR 1.2    Cardiac Enzymes: No results for input(s): "CKTOTAL", "CKMB", "CKMBINDEX", "TROPONINI" in the last 168 hours.  HbA1C: Hgb A1c MFr Bld  Date/Time Value Ref Range Status  09/22/2023 08:46 AM 6.4 (H) 4.8 - 5.6 % Final    Comment:    (NOTE) Pre diabetes:          5.7%-6.4%  Diabetes:              >6.4%  Glycemic control for   <7.0% adults with diabetes   03/01/2020 09:31 AM 6.9 (H) 4.8 - 5.6 % Final    Comment:             Prediabetes: 5.7 - 6.4          Diabetes: >6.4          Glycemic control for adults with diabetes: <7.0     CBG: Recent Labs  Lab 10/10/23 0332  GLUCAP 188*    Review of Systems:   C/o chest pains, abd discomfort, diarrhea and SOB  Past Medical History:  He,  has a past medical history of Abdominal hernia, Arthritis, Benign localized prostatic hyperplasia with lower urinary tract symptoms  (LUTS), CAD (coronary artery disease) (05/2004), Dyslipidemia, Essential hypertension, Heart murmur, History of COVID-19 (05/2020), History of ST elevation myocardial infarction (STEMI) (02/16/2009), History of urinary retention, 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(HCC) (04/2021), OSA (obstructive sleep apnea), PONV (postoperative nausea and vomiting), Pre-diabetes, and S/p bare metal coronary artery stent.   Surgical History:   Past Surgical History:  Procedure Laterality Date   CARDIAC CATHETERIZATION  05/27/2004   outpatient by dr Loetta Ringer [facility] cardiology) for positive stress test;  80% stensis pRI   CORONARY ANGIOPLASTY WITH STENT PLACEMENT  06/18/2004   @[facility]  by Dr Ebbie Goldmann;  PCI w/ BMS to pRI  (CoStar study  stent - 2.5 x 16)   CORONARY ANGIOPLASTY WITH STENT PLACEMENT  02/16/2009   @[facility]  by dr Abel Hoe;   Inferior STEMI:  PCI w/ thrombectomy total occluded RCA, BMS x1 to pRCA (Vision BMS 3.5 x 23 -> 4.0 mm), residual 40-50% pLAD, ef 45-50%   GOLD SEED IMPLANT N/A 09/19/2021   Procedure: GOLD SEED IMPLANT;  Surgeon: Andrez Banker, MD;  Location: [facility];  Service: Urology;  Laterality: N/A;  ONLY NEEDS 30 MIN FOR ALL   INGUINAL HERNIA REPAIR Right 09/02/1999   @[facility] ;   recurrent repair right inguinal hernia;   previous repair approx 1990s   INGUINAL HERNIA REPAIR Left    1990s   KNEE ARTHROSCOPY W/ MENISCAL REPAIR Right 12/18/2005   @[facility]    LUMBAR DISC SURGERY  1982   L5--S1   SPACE OAR INSTILLATION N/A 09/19/2021   Procedure: SPACE OAR INSTILLATION;  Surgeon: Andrez Banker, MD;  Location: [facility];  Service: Urology;  Laterality: N/A;   TOTAL KNEE ARTHROPLASTY Left 10/01/2023   Procedure: ARTHROPLASTY, KNEE, TOTAL;  Surgeon: Winston Hawking, MD;  Location: [facility];  Service: Orthopedics;  Laterality: Left;     Social History:   reports that he has never smoked. He has never used smokeless tobacco. He reports that he does not drink alcohol and  does not use drugs.   Family History:  His family history includes Lung cancer in his mother.   Allergies Allergies  Allergen Reactions   Codeine Nausea And Vomiting   Latex Rash    "Oral blisters when used at dentist"     Home Medications  Prior to Admission medications   Medication Sig Start Date End Date Taking? Authorizing Provider  amLODipine  (NORVASC ) 10 MG tablet TAKE 1 TABLET BY MOUTH EVERY DAY 05/04/23   Arleen Lacer, MD  aspirin  EC 81 MG tablet Take 1 tablet (81 mg total) by mouth in the morning and at bedtime. 10/01/23 10/31/23  Winston Hawking, MD  Cannabidiol  POWD Apply 1 Application topically 3 (three) times daily as needed (knee pain.). CBD cream    [provider]  furosemide  (LASIX ) 20 MG tablet Take 1 tablet (20 mg total) by mouth daily. Patient not taking: Reported on 08/24/2023 06/21/23   Arleen Lacer, MD  hydrochlorothiazide  (HYDRODIURIL ) 25 MG tablet Take 25 mg by mouth daily.    [provider]  ibuprofen  (ADVIL ) 200 MG tablet Take 400 mg by mouth every 8 (eight) hours as needed (pain).    [provider]  methocarbamol  (ROBAXIN ) 500 MG tablet Take 1 tablet (500 mg total) by mouth every 8 (eight) hours as needed for muscle spasms. 10/01/23   Winston Hawking, MD  metoprolol  succinate (TOPROL -XL) 25 MG 24 hr tablet Take 0.5 tablets (12.5 mg total) by mouth daily. 06/21/23   Arleen Lacer, MD  Multiple Vitamin (MULTIVITAMIN WITH MINERALS) TABS tablet Take 1 tablet by mouth in the morning.    [provider]  ondansetron  (ZOFRAN ) 4 MG tablet Take 1 tablet (4 mg total) by mouth every 8 (eight) hours as needed for nausea, vomiting or refractory nausea / vomiting. 10/01/23 09/30/24  Winston Hawking, MD  oxyCODONE  (ROXICODONE ) 5 MG immediate release tablet Take 1-2 tablets (5-10 mg total) by mouth every 4 (four) hours as needed for severe pain (pain score 7-10). 10/01/23   Winston Hawking, MD  rosuvastatin  (CRESTOR ) 20 MG tablet Take 1 tablet (20 mg  total) by mouth daily. Patient taking differently: Take 20  mg by mouth every evening. 06/21/23   Arleen Lacer, MD  tamsulosin  (FLOMAX ) 0.4 MG CAPS capsule Take 1 capsule (0.4 mg total) by mouth daily after supper. 11/17/21   Kenith Payer, MD  valsartan  (DIOVAN ) 320 MG tablet TAKE 1 TABLET BY MOUTH EVERY DAY 01/14/23   Arleen Lacer, MD     Critical care time: 67   The patient is critically ill with multiple organ system failure and requires high complexity decision making for assessment and support, frequent evaluation and titration of therapies, advanced monitoring, review of radiographic studies and interpretation of complex data.   Critical Care Time devoted to patient care services, exclusive of separately billable procedures, described in this note is 38 minutes.   Claven Cumming, MD [facility] See Amion for pager  If no response to pager , please call [phone] until 7pm After 7:00 pm call Elink  [phone] 10/10/2023, 4:43 AM

## 2023-10-10 NOTE — Progress Notes (Addendum)
 eLink Physician-Brief Progress Note Patient Name: Chase Combs DOB: 15-Jul-1945 MRN: [id number]   Date of Service  10/10/2023  HPI/Events of Note  Having on and off abdominal pain, sharp in nature associated with distention and bloating with several bowel movements today concerning for enteric disease.  Enteric precautions in place.  Currently with clear liquid diet and associated nausea.  eICU Interventions  Add simethicone   Maintain acetaminophen /Zofran .  No new intervention is currently indicated.   7062 -still having abdominal pain, hypoactive bowel sounds  in the setting of diverticulosis, one-time tramadol .  Intervention Category Minor Interventions: Clinical assessment - ordering diagnostic tests  Leshia Kope 10/10/2023, 9:19 PM

## 2023-10-10 NOTE — ED Notes (Signed)
 Reports given to inpatient RN

## 2023-10-11 LAB — GLUCOSE, CAPILLARY
Glucose-Capillary: 126 mg/dL — ABNORMAL HIGH (ref 70–99)
Glucose-Capillary: 135 mg/dL — ABNORMAL HIGH (ref 70–99)
Glucose-Capillary: 83 mg/dL (ref 70–99)
Glucose-Capillary: 87 mg/dL (ref 70–99)
Glucose-Capillary: 95 mg/dL (ref 70–99)
Glucose-Capillary: 99 mg/dL (ref 70–99)

## 2023-10-11 LAB — CBC
HCT: 30.5 % — ABNORMAL LOW (ref 39.0–52.0)
Hemoglobin: 10 g/dL — ABNORMAL LOW (ref 13.0–17.0)
MCH: 29.7 pg (ref 26.0–34.0)
MCHC: 32.8 g/dL (ref 30.0–36.0)
MCV: 90.5 fL (ref 80.0–100.0)
Platelets: 473 10*3/uL — ABNORMAL HIGH (ref 150–400)
RBC: 3.37 MIL/uL — ABNORMAL LOW (ref 4.22–5.81)
RDW: 14.5 % (ref 11.5–15.5)
WBC: 3.5 10*3/uL — ABNORMAL LOW (ref 4.0–10.5)
nRBC: 0 % (ref 0.0–0.2)

## 2023-10-11 LAB — BASIC METABOLIC PANEL WITH GFR
Anion gap: 13 (ref 5–15)
Anion gap: 13 (ref 5–15)
BUN: 39 mg/dL — ABNORMAL HIGH (ref 8–23)
BUN: 43 mg/dL — ABNORMAL HIGH (ref 8–23)
CO2: 28 mmol/L (ref 22–32)
CO2: 27 mmol/L (ref 22–32)
Calcium: 8.1 mg/dL — ABNORMAL LOW (ref 8.9–10.3)
Calcium: 8 mg/dL — ABNORMAL LOW (ref 8.9–10.3)
Chloride: 91 mmol/L — ABNORMAL LOW (ref 98–111)
Chloride: 92 mmol/L — ABNORMAL LOW (ref 98–111)
Creatinine, Ser: 1.12 mg/dL (ref 0.61–1.24)
Creatinine, Ser: 1.19 mg/dL (ref 0.61–1.24)
GFR, Estimated: >60 mL/min (ref >60)
GFR, Estimated: >60 mL/min (ref >60)
Glucose, Bld: 119 mg/dL — ABNORMAL HIGH (ref 70–99)
Glucose, Bld: 80 mg/dL (ref 70–99)
Potassium: 2.5 mmol/L — CL (ref 3.5–5.1)
Potassium: 3.1 mmol/L — ABNORMAL LOW (ref 3.5–5.1)
Sodium: 132 mmol/L — ABNORMAL LOW (ref 135–145)
Sodium: 132 mmol/L — ABNORMAL LOW (ref 135–145)

## 2023-10-11 LAB — MAGNESIUM
Magnesium: 2.2 mg/dL (ref 1.7–2.4)
Magnesium: 2.2 mg/dL (ref 1.7–2.4)

## 2023-10-11 MED ORDER — ACETAMINOPHEN 325 MG PO TABS
650.0000 mg | ORAL_TABLET | Freq: Four times a day (QID) | ORAL | Status: AC
  Filled 2023-10-11 (×2): qty 2

## 2023-10-11 MED ORDER — ACETAMINOPHEN 325 MG PO TABS
650.0000 mg | ORAL_TABLET | Freq: Four times a day (QID) | ORAL | Status: DC
  Administered 2023-10-11 (×2): 650 mg via ORAL
  Filled 2023-10-11: qty 2

## 2023-10-11 MED ORDER — POTASSIUM CHLORIDE CRYS ER 20 MEQ PO TBCR
40.0000 meq | EXTENDED_RELEASE_TABLET | ORAL | Status: AC
  Administered 2023-10-11 – 2023-10-12 (×2): 40 meq via ORAL
  Filled 2023-10-11 (×2): qty 2

## 2023-10-11 MED ORDER — POTASSIUM CHLORIDE 10 MEQ/100ML IV SOLN
10.0000 meq | INTRAVENOUS | Status: AC
  Administered 2023-10-11 (×4): 10 meq via INTRAVENOUS
  Filled 2023-10-11 (×4): qty 100

## 2023-10-11 MED ORDER — ACETAMINOPHEN 10 MG/ML IV SOLN
1000.0000 mg | Freq: Four times a day (QID) | INTRAVENOUS | Status: AC
  Administered 2023-10-12 (×4): 1000 mg via INTRAVENOUS
  Filled 2023-10-11 (×4): qty 100

## 2023-10-11 MED ORDER — POTASSIUM CHLORIDE 20 MEQ PO PACK
40.0000 meq | PACK | ORAL | Status: DC
  Administered 2023-10-11 (×2): 40 meq via ORAL
  Filled 2023-10-11 (×3): qty 2

## 2023-10-11 MED ORDER — METHYLNALTREXONE BROMIDE 12 MG/0.6ML ~~LOC~~ SOLN
12.0000 mg | Freq: Once | SUBCUTANEOUS | Status: AC
  Administered 2023-10-11: 12 mg via SUBCUTANEOUS
  Filled 2023-10-11: qty 0.6

## 2023-10-11 MED ORDER — TRAMADOL HCL 50 MG PO TABS
50.0000 mg | ORAL_TABLET | Freq: Once | ORAL | Status: AC
  Administered 2023-10-11: 50 mg via ORAL
  Filled 2023-10-11: qty 1

## 2023-10-11 MED ORDER — LACTATED RINGERS IV SOLN: INTRAVENOUS | Status: AC

## 2023-10-11 MED ORDER — SODIUM CHLORIDE 0.9 % IV SOLN
12.5000 mg | INTRAVENOUS | Status: DC | PRN
  Administered 2023-10-11 – 2023-10-12 (×3): 12.5 mg via INTRAVENOUS
  Filled 2023-10-11 (×2): qty 12.5
  Filled 2023-10-11: qty 0.5

## 2023-10-11 NOTE — Progress Notes (Signed)
 eLink Physician-Brief Progress Note Patient Name: Chase Combs DOB: 06-16-1945 MRN: [id number]   Date of Service  10/11/2023  HPI/Events of Note  Continues to have substantial abdominal distention, nausea, and generalized discomfort in the setting of opiate induced ileus.  Severely hypokalemic.  eICU Interventions  Requesting potential IV Tylenol  in place of p.o. for his discomfort.  For limited number of doses this is not unreasonable.  Maintain Kcl p.o. if he can tolerate it     Intervention Category Minor Interventions: Routine modifications to care plan (e.g. PRN medications for pain, fever)  Kyley Laurel 10/11/2023, 8:16 PM

## 2023-10-11 NOTE — Progress Notes (Signed)
 Readmit. Pt  presents with nonsustained VT and PVCs in the setting of acute kidney injury and acute hypoxemia .Pt s/p Left TKR on 10/01/2023 , hx of CAD S/p MI s/p coronary stent x 2.   10/11/23 0823  TOC Brief Assessment  Insurance and Status Reviewed  Patient has primary care physician Yes  Home environment has been reviewed From home with wife.  Prior level of function: PTA independent with ADL'S. Outpatient therapy with [facility]  Prior/Current Home Services No current home services  Social Drivers of Health Review SDOH reviewed no interventions necessary  Readmission risk has been reviewed Yes  Transition of care needs transition of care needs identified, TOC will continue to follow   [facility] team following and will assist with needs.Carlee Charters RN,BSN,CM

## 2023-10-11 NOTE — Progress Notes (Signed)
 NAME:  Chase Combs, MRN:  [id number], DOB:  November 21, 1945, LOS: 1 ADMISSION DATE:  10/09/2023, CONSULTATION DATE:  10/10/2023 REFERRING MD:  Cris Dollar, PA-C, CHIEF COMPLAINT:  SOB  History of Present Illness:  78 y/o make with PMH significant for Left TKR on 10/01/2023 for osteoarthritis, CAD S/p MI s/p coronary stent x 2, who presented to [facility] with c/o feeling "unwell" x 1 week since his TKR.  Patient noted to have distended abdomen, Tachypnic and Tachycardiac upon arrival to ED and he was hypoxic.  A CT abdomen/pelvis and CTA for Pulmonary emboli were done.  He was noted to have frequent PVCs and possible short runs of V Tach and Amiodarone  drip was started.  CT scan abd-no definitive obstruction and CTA no Pulmonary emboli.  Patient noted to have abdominal distension and hypoxia post op as well during several days of recovery with one day where he had some vomiting and PT was canceled.  Pertinent  Medical History  Abdominal hernia, Arthritis, Benign localized prostatic hyperplasia with lower urinary tract symptoms (LUTS), CAD (coronary artery disease) (05/2004), Dyslipidemia, Essential hypertension, Heart murmur, History of COVID-19 (05/2020), History of ST elevation myocardial infarction (STEMI) (02/16/2009), History of urinary retention, 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(HCC) (04/2021), OSA (obstructive sleep apnea), PONV (postoperative nausea and vomiting), Pre-diabetes, and S/p bare metal coronary artery stent.   Significant Hospital Events: Including procedures, antibiotic start and stop dates in addition to other pertinent events   Transfer [facility] to ICU [facility]  Interim History / Subjective:  N/a  Objective    Blood pressure 132/62, pulse 93, temperature 98.4 F (36.9 C), temperature source Oral, resp. rate (!) 30, weight 99.9 kg, SpO2 99%.        Intake/Output Summary (Last 24 hours) at 10/11/2023 1034 Last data filed at 10/11/2023 0800 Gross per 24 hour  Intake  2758.85 ml  Output 750 ml  Net 2008.85 ml   Filed Weights   10/09/23 2211 10/10/23 0500 10/11/23 0418  Weight: 94.3 kg 98.1 kg 99.9 kg    Examination: Awake, alert, oriented Breathing non labored Breath sounds clear, no wheezes On HHFNC Abdomen distended, nontender, hypoactive bowel sounds Tachycardic, regular No peripheral edema  Labs/imaging personally reviewed     Resolved problem list   Assessment and Plan   Acute hypoxemic respiratory failure Likely in the setting of hypoventilation from markedly distended abdomen. On 3L of [place], will attempt to wean.    Opioid Induced Ileus Driving factor behind all of his symptoms currently. Will treat with methylnaltrexone , and treat mild abdominal pain with tylenol . Allowing bowel rest for now, low threshold for NG tube but will see how he does with methylnaltrexone  first.    Hypokalemia Hypocalcemia Hyponatremia Secondary to decreased oral intake, diarrhea. Repleting electrolytes as needed - replace, repeat BMET - Restarting IVF now with LR  CAD s/p CABG Frequent PVCs and NSVT HTN Likely in the setting of electrolyte abnormalities as stated above. Will continue amio for now until electrolytes are stable. Once electrolytes are stable will need to restart his home medications.   Best Practice (right click and "Reselect all SmartList Selections" daily)   Diet/type: clear liquids and advance as tolerated DVT prophylaxis prophylactic heparin   Pressure ulcer(s): N/A GI prophylaxis: N/A Lines: N/A Foley:  N/A Code Status:  full code  I spent 50 minutes in total visit time for this patient, with more than 50% spent counseling/coordinating care.  Dyamond Tolosa, MD  [facility] Internal Medicine Resident, PGY-2  If no response to pager, please call critical care on call (see AMION) until 7pm After 7:00 pm call Elink

## 2023-10-12 ENCOUNTER — Inpatient Hospital Stay (HOSPITAL_COMMUNITY)

## 2023-10-12 DIAGNOSIS — R140 Abdominal distension (gaseous): Secondary | ICD-10-CM

## 2023-10-12 DIAGNOSIS — K567 Ileus, unspecified: Secondary | ICD-10-CM | POA: Diagnosis not present

## 2023-10-12 DIAGNOSIS — K9189 Other postprocedural complications and disorders of digestive system: Secondary | ICD-10-CM | POA: Diagnosis not present

## 2023-10-12 DIAGNOSIS — E46 Unspecified protein-calorie malnutrition: Secondary | ICD-10-CM

## 2023-10-12 DIAGNOSIS — R638 Other symptoms and signs concerning food and fluid intake: Secondary | ICD-10-CM

## 2023-10-12 LAB — BASIC METABOLIC PANEL WITH GFR
Anion gap: 9 (ref 5–15)
BUN: 45 mg/dL — ABNORMAL HIGH (ref 8–23)
CO2: 29 mmol/L (ref 22–32)
Calcium: 8.1 mg/dL — ABNORMAL LOW (ref 8.9–10.3)
Chloride: 94 mmol/L — ABNORMAL LOW (ref 98–111)
Creatinine, Ser: 1.13 mg/dL (ref 0.61–1.24)
GFR, Estimated: >60 mL/min (ref >60)
Glucose, Bld: 94 mg/dL (ref 70–99)
Potassium: 3.6 mmol/L (ref 3.5–5.1)
Sodium: 132 mmol/L — ABNORMAL LOW (ref 135–145)

## 2023-10-12 LAB — MAGNESIUM: Magnesium: 2.1 mg/dL (ref 1.7–2.4)

## 2023-10-12 LAB — GLUCOSE, CAPILLARY
Glucose-Capillary: 92 mg/dL (ref 70–99)
Glucose-Capillary: 112 mg/dL — ABNORMAL HIGH (ref 70–99)
Glucose-Capillary: 125 mg/dL — ABNORMAL HIGH (ref 70–99)
Glucose-Capillary: 96 mg/dL (ref 70–99)
Glucose-Capillary: 116 mg/dL — ABNORMAL HIGH (ref 70–99)

## 2023-10-12 MED ORDER — POTASSIUM CHLORIDE 10 MEQ/100ML IV SOLN
10.0000 meq | INTRAVENOUS | Status: AC
  Administered 2023-10-12 (×4): 10 meq via INTRAVENOUS
  Filled 2023-10-12 (×3): qty 100

## 2023-10-12 MED ORDER — METHYLNALTREXONE BROMIDE 12 MG/0.6ML ~~LOC~~ SOLN
12.0000 mg | Freq: Once | SUBCUTANEOUS | Status: AC
  Administered 2023-10-12: 12 mg via SUBCUTANEOUS
  Filled 2023-10-12: qty 0.6

## 2023-10-12 MED ORDER — SMOG ENEMA
400.0000 mL | Freq: Once | RECTAL | Status: AC
  Administered 2023-10-12: 400 mL via RECTAL
  Filled 2023-10-12: qty 960

## 2023-10-12 MED ORDER — SODIUM CHLORIDE 0.9 % IV SOLN
2.0000 mg | Freq: Once | INTRAVENOUS | Status: AC
  Administered 2023-10-12: 2 mg via INTRAVENOUS
  Filled 2023-10-12: qty 2

## 2023-10-12 MED ORDER — METHYLNALTREXONE BROMIDE 12 MG/0.6ML ~~LOC~~ SOLN
12.0000 mg | Freq: Once | SUBCUTANEOUS | Status: AC
  Administered 2023-10-13: 12 mg via SUBCUTANEOUS
  Filled 2023-10-12: qty 0.6

## 2023-10-12 MED ORDER — TRAMADOL HCL 50 MG PO TABS
50.0000 mg | ORAL_TABLET | Freq: Once | ORAL | Status: DC
  Filled 2023-10-12 (×2): qty 1

## 2023-10-12 MED ORDER — POTASSIUM CHLORIDE 10 MEQ/100ML IV SOLN
INTRAVENOUS | Status: AC
  Filled 2023-10-12: qty 100

## 2023-10-12 MED ORDER — METOCLOPRAMIDE HCL 5 MG/ML IJ SOLN
10.0000 mg | Freq: Four times a day (QID) | INTRAMUSCULAR | Status: AC
  Administered 2023-10-12 – 2023-10-15 (×14): 10 mg via INTRAVENOUS
  Filled 2023-10-12 (×14): qty 2

## 2023-10-12 MED ORDER — METOCLOPRAMIDE HCL 5 MG/ML IJ SOLN
10.0000 mg | Freq: Three times a day (TID) | INTRAMUSCULAR | Status: DC
  Administered 2023-10-12: 10 mg via INTRAVENOUS
  Filled 2023-10-12: qty 2

## 2023-10-12 NOTE — Consult Note (Addendum)
 Consultation  Referring Provider: CCM/ Agarwala Primary Care Physician:  Ilsa Maltese, PA Primary Gastroenterologist:unassigned  Reason for Consultation:  acute post operative ileus  HPI: Chase Combs is a 78 y.o. male, who underwent left total knee replacement on 10/01/2023.  He has history of coronary artery disease status post previous MI and stents x 2, history of hypertension, BPH, prostate cancer, sleep apnea. Patient and family state that he has not been doing well ever since the surgery and really has not had a bowel movement since surgery.  Patient's wife says he really has been unable to eat much at all since the time of surgery, had been feeling sleepy and lethargic for a couple of days prior to being brought to the ER on 10/09/2023.  Had developed a distended abdomen, nausea and vomiting and small volume diarrheal stools for 2 days prior to admission. Patient does not have any chronic GI issues, no chronic problems with constipation generally very regular. He had been taking oxycodone  postoperatively for knee pain. On arrival to the ER he was noted to be hypoxic and tachypneic somewhat tachycardic. CT of the chest abdomen and pelvis on 10/09/2023 no evidence for pulmonary embolus, nonobstructing right renal stone, fluid-filled loops of large and small bowel without definite obstructive change. Initial labs on admission- WBC 4.5/hemoglobin 12/hematocrit 35.3 Lactate 2.1 Potassium 3.1/BUN 40/creatinine 1.45 T. bili 1.8/AST 55/ALT 109/alk phos 195 INR 1.2 Troponin 54>38  Blood cultures no growth thus far, respiratory panel negative  Magnesium 2.4 C Diff-Quik screen and GI path panel both negative Acute hepatitis panel negative Acetaminophen  and salicylate levels negative  10/10/2023 potassium 2.5 this has been corrected and today potassium 3.6/BUN 45/creatinine 1.13/calcium  8.1  Patient says he does not feel any better since admission, may be worse, abdomen feels about  the same he says he is very uncomfortable very tight, not passing any gas but passing small amounts of liquid stool.  Just had NG tube placed within the past 30 minutes, no output as yet Received Relistor  yesterday and again today-no response thus far  KUB this a.m.-report pending-appears to have significant distention of the right colon, max diameter 10 cm also with dilated loops of small bowel.  His hypoxia has improved, currently still on 4 L nasal cannula Course also complicated by frequent PVCs and short runs of V. tach during this admission now on amiodarone .  Has not had any significant nutrition for several days.    Past Medical History:  Diagnosis Date   Abdominal hernia    per pt   Arthritis    hands   Benign localized prostatic hyperplasia with lower urinary tract symptoms (LUTS)    CAD (coronary artery disease) 05/2004   cardiologist--- dr Addie Holstein;  positive stress test , had outpt cardiac cath 05-27-2004 stenosis RI;  06-18-2004 cath w/ PCI and BMS x1 to pRI;   STEMI 02-16-2009  s/p cath w/ PCI thrombectomy for total occluded RCA , BMS x1 to pRCA;  nuclear stress test 03-15-2012 low risk no ischemia but sig inferior-inferolateral infarct , ef 51%   Dyslipidemia    Essential hypertension    Heart murmur    History of COVID-19 05/2020   per pt mild symptoms that resolved   History of ST elevation myocardial infarction (STEMI) 02/16/2009   inferior STEMI   cath w/ pci w/ BMS to pRCA   History of urinary retention    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[facility]) 04/2021   urologist--- dr borden/ radiation oncology--  dr Lorri Rota;  dx 12/ 2022,  Gleason 4+5, PSA 24.6, volume 94.6cc;  plan to start IMRT   OSA (obstructive sleep apnea)    per pt dx approx 2008, no cpap intolerant   PONV (postoperative nausea and vomiting)    Pre-diabetes    S/p bare metal coronary artery stent    01/ 2006  x1 BMS to pRI (CoStar study stent);  and 09/ 2010  x1 BMS to [facility]    Past Surgical History:   Procedure Laterality Date   CARDIAC CATHETERIZATION  05/27/2004   outpatient by dr Loetta Ringer [facility] cardiology) for positive stress test;  80% stensis pRI   CORONARY ANGIOPLASTY WITH STENT PLACEMENT  06/18/2004   @[facility]  by Dr Ebbie Goldmann;  PCI w/ BMS to pRI  (CoStar study stent - 2.5 x 16)   CORONARY ANGIOPLASTY WITH STENT PLACEMENT  02/16/2009   @[facility]  by dr Abel Hoe;   Inferior STEMI:  PCI w/ thrombectomy total occluded RCA, BMS x1 to pRCA (Vision BMS 3.5 x 23 -> 4.0 mm), residual 40-50% pLAD, ef 45-50%   GOLD SEED IMPLANT N/A 09/19/2021   Procedure: GOLD SEED IMPLANT;  Surgeon: Andrez Banker, MD;  Location: [facility];  Service: Urology;  Laterality: N/A;  ONLY NEEDS 30 MIN FOR ALL   INGUINAL HERNIA REPAIR Right 09/02/1999   @[facility] ;   recurrent repair right inguinal hernia;   previous repair approx 1990s   INGUINAL HERNIA REPAIR Left    1990s   KNEE ARTHROSCOPY W/ MENISCAL REPAIR Right 12/18/2005   @[facility]    LUMBAR DISC SURGERY  1982   L5--S1   SPACE OAR INSTILLATION N/A 09/19/2021   Procedure: SPACE OAR INSTILLATION;  Surgeon: Andrez Banker, MD;  Location: [facility];  Service: Urology;  Laterality: N/A;   TOTAL KNEE ARTHROPLASTY Left 10/01/2023   Procedure: ARTHROPLASTY, KNEE, TOTAL;  Surgeon: Winston Hawking, MD;  Location: [facility];  Service: Orthopedics;  Laterality: Left;    Prior to Admission medications   Medication Sig Start Date End Date Taking? Authorizing Provider  amLODipine  (NORVASC ) 10 MG tablet TAKE 1 TABLET BY MOUTH EVERY DAY 05/04/23  Yes Arleen Lacer, MD  aspirin  EC 81 MG tablet Take 1 tablet (81 mg total) by mouth in the morning and at bedtime. 10/01/23 10/31/23 Yes Winston Hawking, MD  Calcium  Carbonate Antacid (ALKA-SELTZER ANTACID PO) Take 1 tablet by mouth 2 (two) times daily as needed (Indigestion).   Yes [provider]  Cannabidiol  POWD Apply 1 Application topically 3 (three) times daily as needed (knee pain.). CBD cream   Yes  [provider]  furosemide  (LASIX ) 20 MG tablet Take 1 tablet (20 mg total) by mouth daily. 06/21/23  Yes Arleen Lacer, MD  ibuprofen  (ADVIL ) 200 MG tablet Take 400 mg by mouth every 8 (eight) hours as needed (pain).   Yes [provider]  methocarbamol  (ROBAXIN ) 500 MG tablet Take 1 tablet (500 mg total) by mouth every 8 (eight) hours as needed for muscle spasms. 10/01/23  Yes Winston Hawking, MD  metoprolol  succinate (TOPROL -XL) 25 MG 24 hr tablet Take 0.5 tablets (12.5 mg total) by mouth daily. 06/21/23  Yes Arleen Lacer, MD  Multiple Vitamin (MULTIVITAMIN WITH MINERALS) TABS tablet Take 1 tablet by mouth in the morning.   Yes [provider]  ondansetron  (ZOFRAN ) 4 MG tablet Take 1 tablet (4 mg total) by mouth every 8 (eight) hours as needed for nausea, vomiting or refractory nausea / vomiting. 10/01/23 09/30/24 Yes  Winston Hawking, MD  oxyCODONE  (ROXICODONE ) 5 MG immediate release tablet Take 1-2 tablets (5-10 mg total) by mouth every 4 (four) hours as needed for severe pain (pain score 7-10). 10/01/23  Yes Winston Hawking, MD  rosuvastatin  (CRESTOR ) 20 MG tablet Take 1 tablet (20 mg total) by mouth daily. Patient taking differently: Take 20 mg by mouth every evening. 06/21/23  Yes Arleen Lacer, MD  tamsulosin  (FLOMAX ) 0.4 MG CAPS capsule Take 1 capsule (0.4 mg total) by mouth daily after supper. 11/17/21  Yes Kenith Payer, MD  valsartan  (DIOVAN ) 320 MG tablet TAKE 1 TABLET BY MOUTH EVERY DAY 01/14/23  Yes Arleen Lacer, MD    Current Facility-Administered Medications  Medication Dose Route Frequency Provider Last Rate Last Admin   acetaminophen  (TYLENOL ) tablet 650 mg  650 mg Oral Q6H Paliwal, Aditya, MD       Or   acetaminophen  (OFIRMEV ) IV 1,000 mg  1,000 mg Intravenous Q6H Paliwal, Aditya, MD 400 mL/hr at 10/12/23 1352 1,000 mg at 10/12/23 1352   Chlorhexidine  Gluconate Cloth 2 % PADS 6 each  6 each Topical Daily Claven Cumming, MD   6 each at 10/11/23 1458    docusate sodium  (COLACE) capsule 100 mg  100 mg Oral BID PRN Autry, Lauren E, PA-C       heparin  injection 5,000 Units  5,000 Units Subcutaneous Q8H Autry, Lauren E, PA-C   5,000 Units at 10/12/23 1353   insulin  aspart (novoLOG ) injection 0-15 Units  0-15 Units Subcutaneous Q4H Autry, Lauren E, PA-C   2 Units at 10/12/23 1206   metoCLOPramide  (REGLAN ) injection 10 mg  10 mg Intravenous Q6H Agarwala, Ravi, MD       Oral care mouth rinse  15 mL Mouth Rinse 4 times per day Claven Cumming, MD   15 mL at 10/12/23 1207   Oral care mouth rinse  15 mL Mouth Rinse PRN Claven Cumming, MD       polyethylene glycol (MIRALAX  / GLYCOLAX ) packet 17 g  17 g Oral Daily PRN Autry, Lauren E, PA-C       simethicone  (MYLICON) chewable tablet 80 mg  80 mg Oral QID PRN Stacy Eagle, MD   80 mg at 10/11/23 0026    Allergies as of 10/09/2023 - Review Complete 10/09/2023  Allergen Reaction Noted   Codeine Nausea And Vomiting 03/03/2013   Latex Rash 10/01/2023    Family History  Problem Relation Age of Onset   Lung cancer Mother     Social History   Socioeconomic History   Marital status: Married    Spouse name: Not on file   Number of children: Not on file   Years of education: Not on file   Highest education level: Not on file  Occupational History   Not on file  Tobacco Use   Smoking status: Never   Smokeless tobacco: Never  Vaping Use   Vaping status: Never Used  Substance and Sexual Activity   Alcohol use: No    Alcohol/week: 0.0 standard drinks of alcohol   Drug use: Never   Sexual activity: Not on file  Other Topics Concern   Not on file  Social History Narrative   He is married, father of 3, grandfather of 5. Never smoked. Does not drink.    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    He does lots of lifting, pulling with ropes,  using upper extremities.       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   Social Drivers of Corporate investment banker Strain: Not on file  Food Insecurity: Patient Declined (10/11/2023)   Hunger Vital Sign    Worried About Running Out of Food in the Last Year: Patient declined    Ran Out of Food in the Last Year: Patient declined  Transportation Needs: Patient Declined (10/11/2023)   PRAPARE - Administrator, Civil Service (Medical): Patient declined    Lack of Transportation (Non-Medical): Patient declined  Physical Activity: Not on file  Stress: Not on file  Social Connections: Patient Declined (10/11/2023)   Social Connection and Isolation Panel [NHANES]    Frequency of Communication with Friends and Family: Patient declined    Frequency of Social Gatherings with Friends and Family: Patient declined    Attends Religious Services: Patient declined    Database administrator or Organizations: Patient declined    Attends Banker Meetings: Patient declined    Marital Status: Patient declined  Intimate Partner Violence: Unknown (10/11/2023)   Humiliation, Afraid, Rape, and Kick questionnaire    Fear of Current or Ex-Partner: Patient declined    Emotionally Abused: Patient declined    Physically Abused: No    Sexually Abused: Patient declined    Review of Systems: Pertinent positive and negative review of systems were noted in the above HPI section.  All other review of systems was otherwise negative.   Physical Exam: Vital signs in last 24 hours: Temp:  [98.1 F (36.7 C)-100.3 F (37.9 C)] 98.1 F (36.7 C) (05/20 1155) Pulse Rate:  [89-116] 89 (05/20 1400) Resp:  [17-31] 26 (05/20 1400) BP: (102-162)/(56-96) 116/76 (05/20 1400) SpO2:  [80 %-100 %] 96 % (05/20 1400) Weight:  [101.4 kg] 101.4 kg (05/20 0500) Last BM Date : 10/11/23 General:   Alert,  Well-developed, acutely ill-appearing, well-nourished,  pleasant and cooperative in NAD-, NG in place, family at bedside Head:  Normocephalic and atraumatic. Eyes:  Sclera clear, no icterus.   Conjunctiva pink. Ears:  Normal auditory acuity. Nose:  No deformity, discharge,  or lesions. Mouth:  No deformity or lesions.   Neck:  Supple; no masses or thyromegaly. Lungs:  Clear throughout to auscultation.  Decreased breath sounds bilateral bases . Heart:  Regular rate and rhythm; no murmurs, clicks, rubs,  or gallops. Abdomen: Obese, grossly distended tense tight tympanitic bowel sounds very quiet, diffusely tender umbilical hernia protuberant Rectal: Not done Msk:  Symmetrical without gross deformities. . Pulses:  Normal pulses noted. Extremities: Status post very recent left total knee replacement, not examined Neurologic:  Alert and  oriented x4;  grossly normal neurologically. Skin:  Intact without significant lesions or rashes.. Psych:  Alert and cooperative. Normal mood and affect.  Intake/Output from previous day: 05/19 0701 - 05/20 0700 In: 1627.5 [I.V.:1090.6; IV Piggyback:536.8] Out: 350 [Urine:350] Intake/Output this shift: Total I/O In: 2305.7 [I.V.:1950.1; IV Piggyback:355.6] Out: -   Lab Results: Recent Labs    10/09/23 2200 10/10/23 0008 10/10/23 0537 10/11/23 0847  WBC 4.5  --   --  3.5*  HGB 12.0* 11.2* 10.9* 10.0*  HCT 35.3* 33.0* 32.0* 30.5*  PLT 551*  --   --  473*   BMET Recent Labs    10/11/23 0847 10/11/23 1600 10/12/23 0326  NA  132* 132* 132*  K 2.5* 3.1* 3.6  CL 91* 92* 94*  CO2 28 27 29   GLUCOSE 119* 80 94  BUN 39* 43* 45*  CREATININE 1.12 1.19 1.13  CALCIUM  8.1* 8.0* 8.1*   LFT Recent Labs    10/10/23 0619  PROT 6.0*  ALBUMIN 2.7*  AST 36  ALT 73*  ALKPHOS 116  BILITOT 1.5*  BILIDIR 0.6*  IBILI 0.9   PT/INR Recent Labs    10/09/23 2200  LABPROT 15.9*  INR 1.2   Hepatitis Panel Recent Labs    10/10/23 0619  HEPBSAG NON REACTIVE  HCVAB NON REACTIVE  HEPAIGM NON REACTIVE   HEPBIGM NON REACTIVE     IMPRESSION:  #46 78 year old white male status post total left knee replacement 10/01/2023, describes not doing well in general since the knee replacement, then development of nausea and vomiting, inability to have a bowel movement other than very small volume liquid stools, Gross abdominal distention, poor oral intake prior to admission and admitted on 10/09/2023 Had been on hydrocodone postoperatively Tachypneic and hypoxic on admission, and also noted to have recurrent PVCs and short runs of V. tach requiring amiodarone  drip.  Initial CT scan did not show any evidence of obstruction, CTA no evidence of PE.  He was noted to have what appears to be a diffuse ileus with distended loops of small bowel colon/transverse and distended stomach  Most consistent with acute postop diffuse ileus  No response to Relistor  x 2 NG-tube just placed Reglan  initiated today  #2 electrolyte derangement secondary to above -corrected #3 coronary artery disease status post prior CABG-frequent PVCs and NSVT here-he is on amiodarone  #4 Nutrition -patient has not had good oral intake since surgery on 10/01/2023-May need to consider alternative nutrition if unable to get him to the point of taking p.o.'s in the next day or 2  Plan; NG to low continuous suction, n.p.o. except ice chips IV Reglan  10 mg every 6 hours Continue electrolyte replacement as needed, keep potassium level closer to 4 Continue Relistor   daily Patient needs to move side-to-side in bed, if able up to chair etc.  Will give smog enema this afternoon to see if we can stimulate his colon, if he has good result would give again in a.m.  Could consider colonic decompression, however he also has distended loops of small bowel. Consider neostigmine-some hesitancy given he has already been having issues with arrhythmia  Repeat films in am GI will follow with you          Chase Dupuy EsterwoodPA-C  10/12/2023, 2:40 PM

## 2023-10-12 NOTE — Progress Notes (Addendum)
 eLink Physician-Brief Progress Note Patient Name: Chase Combs DOB: Sep 18, 1945 MRN: [id number]   Date of Service  10/12/2023  HPI/Events of Note  78 year old male status post left knee replacement May 9, with ongoing issues of nausea, vomiting, poor nutrition/p.o. intake, admitted with tachypnea and hypoxia and found to have short runs of V. tach. Imaging notable for dilated loops of small bowel as well as right colon. He is being treated for opioid-induced/postop ileus with Relistor . He is passing liquid stool, but his abdominal distention has not improved.   Patient has pain despite scheduled Tylenol .  Not due for additional acetaminophen  due to maximal dosing  eICU Interventions  Tramadol  x 1   0013 -patient had been significantly nauseous but it appears that the NG tube had stopped functioning appropriately.  After increasing suction troubleshooting the tube, the patient has had 1.5 L of output which is substantially improved his underlying nausea and his abdominal discomfort.  One-time Zofran  but seems like things are generally improving 0206 - sore throat from vomiting earlier -> lozenges  Intervention Category Minor Interventions: Routine modifications to care plan (e.g. PRN medications for pain, fever)  Chase Combs 10/12/2023, 8:47 PM

## 2023-10-12 NOTE — Progress Notes (Signed)
 [facility] ADULT ICU REPLACEMENT PROTOCOL   The patient does apply for the [facility] Adult ICU Electrolyte Replacment Protocol based on the criteria listed below:   1.Exclusion criteria: TCTS, ECMO, Dialysis, and Myasthenia Gravis patients 2. Is GFR >/= 30 ml/min? Yes.    Patient's GFR today is >60 3. Is SCr </= 2? Yes.   Patient's SCr is 1.13 mg/dL 4. Did SCr increase >/= 0.5 in 24 hours? No. 5.Pt's weight >40kg  Yes.   6. Abnormal electrolyte(s): K+ 3.6  7. Electrolytes replaced per protocol 8.  Call MD STAT for K+ </= 2.5, Phos </= 1, or Mag </= 1 Physician:  Dr Silva Drone, Doloris Freund 10/12/2023 5:33 AM

## 2023-10-12 NOTE — Progress Notes (Signed)
 NAME:  Chase Combs, MRN:  [id number], DOB:  09-Jun-1945, LOS: 2 ADMISSION DATE:  10/09/2023, CONSULTATION DATE:  10/10/2023 REFERRING MD:  Cris Dollar, PA-C, CHIEF COMPLAINT:  SOB  History of Present Illness:  78 y/o make with PMH significant for Left TKR on 10/01/2023 for osteoarthritis, CAD S/p MI s/p coronary stent x 2, who presented to [facility] with c/o feeling "unwell" x 1 week since his TKR.  Patient noted to have distended abdomen, Tachypnic and Tachycardiac upon arrival to ED and he was hypoxic.  A CT abdomen/pelvis and CTA for Pulmonary emboli were done.  He was noted to have frequent PVCs and possible short runs of V Tach and Amiodarone  drip was started.  CT scan abd-no definitive obstruction and CTA no Pulmonary emboli.  Patient noted to have abdominal distension and hypoxia post op as well during several days of recovery with one day where he had some vomiting and PT was canceled.  Pertinent  Medical History  Abdominal hernia, Arthritis, Benign localized prostatic hyperplasia with lower urinary tract symptoms (LUTS), CAD (coronary artery disease) (05/2004), Dyslipidemia, Essential hypertension, Heart murmur, History of COVID-19 (05/2020), History of ST elevation myocardial infarction (STEMI) (02/16/2009), History of urinary retention, 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(HCC) (04/2021), OSA (obstructive sleep apnea), PONV (postoperative nausea and vomiting), Pre-diabetes, and S/p bare metal coronary artery stent.   Significant Hospital Events: Including procedures, antibiotic start and stop dates in addition to other pertinent events   Transfer from [facility] to ICU [facility]  Interim History / Subjective:  N/a  Objective    Blood pressure 136/65, pulse 99, temperature 98.4 F (36.9 C), temperature source Oral, resp. rate (!) 27, weight 101.4 kg, SpO2 99%.        Intake/Output Summary (Last 24 hours) at 10/12/2023 0941 Last data filed at 10/12/2023 0800 Gross per 24 hour   Intake 3908.1 ml  Output 250 ml  Net 3658.1 ml   Filed Weights   10/10/23 0500 10/11/23 0418 10/12/23 0500  Weight: 98.1 kg 99.9 kg 101.4 kg    Examination: Awake, alert, oriented Breathing non labored Breath sounds clear, no wheezes On HHFNC Abdomen distended, nontender, hypoactive bowel sounds Tachycardic, regular No peripheral edema  Labs/imaging personally reviewed     Resolved problem list   Assessment and Plan   Acute hypoxemic respiratory failure Likely in the setting of hypoventilation from markedly distended abdomen. On 5L of HFNC, will attempt to wean.   Opioid Induced Ileus Driving factor behind all of his symptoms currently. Will treat with methylnaltrexone , and treat mild abdominal pain with tylenol . Allowing bowel rest for now, low threshold for NG tube but will see how he does with methylnaltrexone  first.   Plan:  - Continue methylnaltrexone  today  - Start Reglan  today  - Still having bowel movements with diarrhea - KUB today   Hypokalemia Hypocalcemia Hyponatremia Secondary to decreased oral intake, diarrhea. Repleting electrolytes as needed - replace, repeat BMET - Restarting IVF now with LR  CAD s/p CABG Frequent PVCs and NSVT HTN Likely in the setting of electrolyte abnormalities as stated above. Will continue amio for now until electrolytes are stable. Once electrolytes are stable will need to restart his home medications.   Best Practice (right click and "Reselect all SmartList Selections" daily)   Diet/type: clear liquids and advance as tolerated DVT prophylaxis prophylactic heparin   Pressure ulcer(s): N/A GI prophylaxis: N/A Lines: N/A Foley:  N/A Code Status:  full code  I spent 50 minutes in total visit  time for this patient, with more than 50% spent counseling/coordinating care.  Teran Knittle, MD  [facility] Internal Medicine Resident, PGY-2  If no response to pager, please call critical care on call (see AMION) until  7pm After 7:00 pm call Elink

## 2023-10-13 ENCOUNTER — Other Ambulatory Visit: Payer: Self-pay

## 2023-10-13 ENCOUNTER — Inpatient Hospital Stay (HOSPITAL_COMMUNITY)

## 2023-10-13 DIAGNOSIS — K9189 Other postprocedural complications and disorders of digestive system: Secondary | ICD-10-CM | POA: Diagnosis not present

## 2023-10-13 DIAGNOSIS — K567 Ileus, unspecified: Secondary | ICD-10-CM | POA: Diagnosis not present

## 2023-10-13 DIAGNOSIS — R140 Abdominal distension (gaseous): Secondary | ICD-10-CM | POA: Diagnosis not present

## 2023-10-13 LAB — GLUCOSE, CAPILLARY
Glucose-Capillary: 112 mg/dL — ABNORMAL HIGH (ref 70–99)
Glucose-Capillary: 115 mg/dL — ABNORMAL HIGH (ref 70–99)
Glucose-Capillary: 119 mg/dL — ABNORMAL HIGH (ref 70–99)
Glucose-Capillary: 126 mg/dL — ABNORMAL HIGH (ref 70–99)
Glucose-Capillary: 132 mg/dL — ABNORMAL HIGH (ref 70–99)
Glucose-Capillary: 151 mg/dL — ABNORMAL HIGH (ref 70–99)
Glucose-Capillary: 155 mg/dL — ABNORMAL HIGH (ref 70–99)

## 2023-10-13 LAB — BASIC METABOLIC PANEL WITH GFR
Anion gap: 11 (ref 5–15)
BUN: 44 mg/dL — ABNORMAL HIGH (ref 8–23)
CO2: 31 mmol/L (ref 22–32)
Calcium: 8.7 mg/dL — ABNORMAL LOW (ref 8.9–10.3)
Chloride: 94 mmol/L — ABNORMAL LOW (ref 98–111)
Creatinine, Ser: 0.84 mg/dL (ref 0.61–1.24)
GFR, Estimated: >60 mL/min (ref >60)
Glucose, Bld: 125 mg/dL — ABNORMAL HIGH (ref 70–99)
Potassium: 3.8 mmol/L (ref 3.5–5.1)
Sodium: 136 mmol/L (ref 135–145)

## 2023-10-13 LAB — PHOSPHORUS: Phosphorus: 2.5 mg/dL (ref 2.5–4.6)

## 2023-10-13 LAB — MAGNESIUM: Magnesium: 2.3 mg/dL (ref 1.7–2.4)

## 2023-10-13 MED ORDER — METHYLNALTREXONE BROMIDE 12 MG/0.6ML ~~LOC~~ SOLN
12.0000 mg | Freq: Once | SUBCUTANEOUS | Status: AC
  Administered 2023-10-13: 12 mg via SUBCUTANEOUS
  Filled 2023-10-13: qty 0.6

## 2023-10-13 MED ORDER — LIDOCAINE 5 % EX PTCH
1.0000 | MEDICATED_PATCH | CUTANEOUS | Status: DC
  Administered 2023-10-13 – 2023-10-26 (×14): 1 via TRANSDERMAL
  Filled 2023-10-13 (×14): qty 1

## 2023-10-13 MED ORDER — POTASSIUM PHOSPHATES 15 MMOLE/5ML IV SOLN
15.0000 mmol | Freq: Once | INTRAVENOUS | Status: AC
  Administered 2023-10-13: 15 mmol via INTRAVENOUS
  Filled 2023-10-13: qty 5

## 2023-10-13 MED ORDER — SODIUM CHLORIDE 0.9% FLUSH
10.0000 mL | Freq: Two times a day (BID) | INTRAVENOUS | Status: DC
  Administered 2023-10-13 – 2023-10-27 (×23): 10 mL

## 2023-10-13 MED ORDER — SODIUM CHLORIDE 0.9% FLUSH: 10.0000 mL | INTRAVENOUS | Status: DC | PRN

## 2023-10-13 MED ORDER — POTASSIUM CHLORIDE 10 MEQ/100ML IV SOLN
10.0000 meq | INTRAVENOUS | Status: DC
  Administered 2023-10-13 (×2): 10 meq via INTRAVENOUS
  Filled 2023-10-13 (×4): qty 100

## 2023-10-13 MED ORDER — ONDANSETRON HCL 4 MG/2ML IJ SOLN
4.0000 mg | Freq: Once | INTRAMUSCULAR | Status: DC
  Filled 2023-10-13: qty 2

## 2023-10-13 MED ORDER — MENTHOL 3 MG MT LOZG
1.0000 | LOZENGE | OROMUCOSAL | Status: DC | PRN
  Administered 2023-10-13: 3 mg via ORAL
  Filled 2023-10-13: qty 9

## 2023-10-13 MED ORDER — TRAVASOL 10 % IV SOLN
INTRAVENOUS | Status: AC
Start: 2023-10-13 — End: 2023-10-14
  Filled 2023-10-13: qty 508.8

## 2023-10-13 MED ORDER — TRAVASOL 10 % IV SOLN: INTRAVENOUS | Status: DC

## 2023-10-13 MED ORDER — SMOG ENEMA
400.0000 mL | Freq: Once | RECTAL | Status: AC
  Administered 2023-10-13: 400 mL via RECTAL
  Filled 2023-10-13: qty 960

## 2023-10-13 MED ORDER — POTASSIUM CHLORIDE 10 MEQ/100ML IV SOLN
10.0000 meq | INTRAVENOUS | Status: AC
  Administered 2023-10-13 (×2): 10 meq via INTRAVENOUS

## 2023-10-13 NOTE — Progress Notes (Signed)
 Peripherally Inserted Central Catheter Placement  The IV Nurse has discussed with the patient and/or persons authorized to consent for the patient, the purpose of this procedure and the potential benefits and risks involved with this procedure.  The benefits include less needle sticks, lab draws from the catheter, and the patient may be discharged home with the catheter. Risks include, but not limited to, infection, bleeding, blood clot (thrombus formation), and puncture of an artery; nerve damage and irregular heartbeat and possibility to perform a PICC exchange if needed/ordered by physician.  Alternatives to this procedure were also discussed.  Bard Power PICC patient education guide, fact sheet on infection prevention and patient information card has been provided to patient /or left at bedside.    PICC Placement Documentation  PICC Double Lumen 10/13/23 Left Basilic 50 cm 0 cm (Active)  Indication for Insertion or Continuance of Line Administration of hyperosmolar/irritating solutions (i.e. TPN, Vancomycin , etc.) 10/13/23 1527  Site Assessment Clean, Dry, Intact 10/13/23 1527  Lumen #1 Status Flushed;Blood return noted 10/13/23 1527  Lumen #2 Status Flushed;Saline locked;Blood return noted 10/13/23 1527  Dressing Type Transparent;Securing device 10/13/23 1527  Dressing Status Antimicrobial disc/dressing in place;Clean, Dry, Intact 10/13/23 1527  Line Care Connections checked and tightened 10/13/23 1527  Line Adjustment (NICU/IV Team Only) No 10/13/23 1527  Dressing Intervention Adhesive placed at insertion site (IV team only) 10/13/23 1527  Dressing Change Due 10/20/23 10/13/23 1527       Chase Combs 10/13/2023, 3:35 PM

## 2023-10-13 NOTE — Plan of Care (Signed)
 Pt a+ox4. VSS multifocal PVCs. NGT output:1526ml . UOP: . stool in pouch:141ml + 2 unmeasured occurences(med& large) Weaned to RA then placed on 3-4L [place] d/t desat to 88% when asleep. Enema given with + results. K replaced. Double lumen PICC placed for TPN. Pt moved OOB to chair. Wife visited.   Problem: Education: Goal: Knowledge of General Education information will improve Description: Including pain rating scale, medication(s)/side effects and non-pharmacologic comfort measures Outcome: Progressing   Problem: Health Behavior/Discharge Planning: Goal: Ability to manage health-related needs will improve Outcome: Progressing   Problem: Clinical Measurements: Goal: Ability to maintain clinical measurements within normal limits will improve Outcome: Progressing Goal: Will remain free from infection Outcome: Progressing Goal: Diagnostic test results will improve Outcome: Progressing Goal: Respiratory complications will improve Outcome: Progressing Goal: Cardiovascular complication will be avoided Outcome: Progressing   Problem: Activity: Goal: Risk for activity intolerance will decrease Outcome: Progressing   Problem: Nutrition: Goal: Adequate nutrition will be maintained Outcome: Progressing   Problem: Coping: Goal: Level of anxiety will decrease Outcome: Progressing   Problem: Elimination: Goal: Will not experience complications related to bowel motility Outcome: Progressing Goal: Will not experience complications related to urinary retention Outcome: Progressing   Problem: Pain Managment: Goal: General experience of comfort will improve and/or be controlled Outcome: Progressing   Problem: Safety: Goal: Ability to remain free from injury will improve Outcome: Progressing   Problem: Skin Integrity: Goal: Risk for impaired skin integrity will decrease Outcome: Progressing   Problem: Education: Goal: Ability to describe self-care measures that may prevent or  decrease complications (Diabetes Survival Skills Education) will improve Outcome: Progressing Goal: Individualized Educational Video(s) Outcome: Progressing   Problem: Coping: Goal: Ability to adjust to condition or change in health will improve Outcome: Progressing   Problem: Fluid Volume: Goal: Ability to maintain a balanced intake and output will improve Outcome: Progressing   Problem: Health Behavior/Discharge Planning: Goal: Ability to identify and utilize available resources and services will improve Outcome: Progressing Goal: Ability to manage health-related needs will improve Outcome: Progressing   Problem: Metabolic: Goal: Ability to maintain appropriate glucose levels will improve Outcome: Progressing   Problem: Nutritional: Goal: Maintenance of adequate nutrition will improve Outcome: Progressing Goal: Progress toward achieving an optimal weight will improve Outcome: Progressing   Problem: Skin Integrity: Goal: Risk for impaired skin integrity will decrease Outcome: Progressing   Problem: Tissue Perfusion: Goal: Adequacy of tissue perfusion will improve Outcome: Progressing

## 2023-10-13 NOTE — Progress Notes (Signed)
 PHARMACY - TOTAL PARENTERAL NUTRITION CONSULT NOTE   Indication: Prolonged ileus  Patient Measurements: Weight: 98.3 kg (216 lb 11.4 oz)   Body mass index is 32 kg/m. Usual Weight: 98.3 kg  Assessment:  [name] who presents with abdominal distention in the setting of prolonged ileus. Patient had surgery on his knee on May 9 and was taking oxycodone  at home for pain. Endorses that intake has declined significantly since his procedure. Prior to procedure he was eating 3 meals a day which consisted of coffee and toast for breakfast, a veggie platter and meat for lunch, and a dinner prepared by his wife. Does not remember intake after surgery very well but states he was eating very little as nothing was appealing to him.   Has been receiving Relistor , Reglan , and neostigmine in combination with SMOG enemas, however has persistent bowel dilation. No bowel sounds present on physical exam. Pharmacy has been consulted to initiate TPN in setting of prolonged ileus and minimal intake.   Glucose / Insulin : CBG < 180, mSSI, no history of DM Electrolytes: K 3.8, phos 2.5, Mg 2.3, Cl 94 (low) Renal: Scr 0.84 Hepatic: LFTs WNL Intake / Output; MIVF: 600 mL stool output (diarrhea), 2.7 L NG output 5/20 GI Imaging: KUB 5/21 with progressive adynamic ileus GI Surgeries / Procedures: N/A  Central access: PICC (placed 5/21) TPN start date: 5/21  Nutritional Goals: Goal TPN rate is 90 mL/hr (provides 114.5 g of protein and 2330 kcals per day)  RD Assessment: Estimated Needs Total Energy Estimated Needs: 2200-2400 Total Protein Estimated Needs: 110-130 grams Total Fluid Estimated Needs: >/= 2 L  Current Nutrition:  NPO  Plan:  Start TPN at 40 mL/hr at 1800 Electrolytes in TPN: Na 58mEq/L, K 41mEq/L, Ca 7mEq/L, Mg 62mEq/L, and Phos 15mmol/L. Cl:Ac 2:1 Add standard MVI and trace elements to TPN Continue Moderate q4h SSI and adjust as needed  Monitor TPN labs on Mon/Thurs, daily for the first three  days  Will give 15 mmol of potassium phosphate IV x 1 dose   Chase Combs, PharmD PGY1 Pharmacy Resident 10/13/2023 11:40 AM

## 2023-10-13 NOTE — Progress Notes (Addendum)
 eLink Physician-Brief Progress Note Patient Name: Chase Combs DOB: [birth year] MRN: [id number]   Date of Service  10/13/2023  HPI/Events of Note  c/o back pain despite opiate induced ileus  eICU Interventions  Lidocaine  patch   2202 -Flexi-Seal for copious liquid stool  0527 - K 2.8, Phos 2.3, Cr 0.84. -> kPhos + Kcl  Intervention Category Minor Interventions: Routine modifications to care plan (e.g. PRN medications for pain, fever)  Nakema Fake 10/13/2023, 8:56 PM

## 2023-10-13 NOTE — Evaluation (Signed)
 Physical Therapy Evaluation Patient Details Name: Chase Combs MRN: [id number] DOB: Oct 16, 1945 Today's Date: 10/13/2023  History of Present Illness  Pt is 78 year old presented to [facility] ED 10/09/23 for N/V and abdominal distension. Pt with ileus, short runs of Vtach, and hypoxic respiratory failure.  Pt transferred to [facility] on 10/10/23. Pt with recent lt TKR (10/01/23).  PMH - CAD, coronary stent, obesity, htn, prostate CA, OSA  Clinical Impression  Pt admitted with above diagnosis and presents to PT with functional limitations due to deficits listed below (See PT problem list). Pt needs skilled PT to maximize independence and safety. Pt now rehospitalized after complications following TKR. Able to get up to a chair today but didn't ambulate due to leaking rectal pouch and unable to contain adequately. Expect should make steady progress and hopefully home with wife.           If plan is discharge home, recommend the following: A little help with walking and/or transfers;A little help with bathing/dressing/bathroom;Assistance with cooking/housework;Assist for transportation   Can travel by private vehicle        Equipment Recommendations None recommended by PT  Recommendations for Other Services       Functional Status Assessment Patient has had a recent decline in their functional status and demonstrates the ability to make significant improvements in function in a reasonable and predictable amount of time.     Precautions / Restrictions Precautions Precautions: Fall;Knee;Other (comment) Precaution/Restrictions Comments: NG tube, rectal pouch Restrictions LLE Weight Bearing Per Provider Order: Weight bearing as tolerated      Mobility  Bed Mobility Overal bed mobility: Needs Assistance Bed Mobility: Supine to Sit     Supine to sit: Min assist, HOB elevated     General bed mobility comments: Assist to bring legs off of bed and to pull trunk up into sitting     Transfers Overall transfer level: Needs assistance Equipment used: Rolling walker (2 wheels) Transfers: Sit to/from Stand, Bed to chair/wheelchair/BSC Sit to Stand: Min assist   Step pivot transfers: Min assist       General transfer comment: Assist for stability. Verbal cues for hand placement    Ambulation/Gait               General Gait Details: Did not attempt due to leaking rectal pouch  Stairs            Wheelchair Mobility     Tilt Bed    Modified Rankin (Stroke Patients Only)       Balance Overall balance assessment: Needs assistance Sitting-balance support: No upper extremity supported, Feet supported Sitting balance-Leahy Scale: Good     Standing balance support: Bilateral upper extremity supported Standing balance-Leahy Scale: Poor Standing balance comment: UE support and CGA for static standing                             Pertinent Vitals/Pain Pain Assessment Pain Assessment: No/denies pain    Home Living Family/patient expects to be discharged to:: Private residence Living Arrangements: Spouse/significant other Available Help at Discharge: Family;Available 24 hours/day Type of Home: House Home Access: Ramped entrance       Home Layout: One level Home Equipment: Agricultural consultant (2 wheels);Rollator (4 wheels);Cane - single point      Prior Function Prior Level of Function : Needs assist       Physical Assist : Mobility (physical) Mobility (physical): Bed mobility;Transfers;Gait   Mobility Comments:  Pt was supervision at home with mobility since recent TKR       Extremity/Trunk Assessment   Upper Extremity Assessment Upper Extremity Assessment: Defer to OT evaluation    Lower Extremity Assessment Lower Extremity Assessment: LLE deficits/detail LLE Deficits / Details: Good quad set. Able to straight leg raise with 5 degree lag. AROM knee 0-90       Communication   Communication Communication:  Impaired Factors Affecting Communication: Hearing impaired    Cognition Arousal: Alert Behavior During Therapy: WFL for tasks assessed/performed   PT - Cognitive impairments: No apparent impairments                         Following commands: Intact       Cueing Cueing Techniques: Verbal cues, Tactile cues     General Comments General comments (skin integrity, edema, etc.): VSS on 3L    Exercises Total Joint Exercises Quad Sets: AROM, Left, 10 reps, Supine   Assessment/Plan    PT Assessment Patient needs continued PT services  PT Problem List Decreased strength;Decreased range of motion;Decreased balance;Decreased mobility;Obesity       PT Treatment Interventions DME instruction;Gait training;Functional mobility training;Therapeutic activities;Therapeutic exercise;Patient/family education;Balance training    PT Goals (Current goals can be found in the Care Plan section)  Acute Rehab PT Goals Patient Stated Goal: go home PT Goal Formulation: With patient Time For Goal Achievement: 10/27/23 Potential to Achieve Goals: Good    Frequency Min 2X/week     Co-evaluation               AM-PAC PT "6 Clicks" Mobility  Outcome Measure Help needed turning from your back to your side while in a flat bed without using bedrails?: A Little Help needed moving from lying on your back to sitting on the side of a flat bed without using bedrails?: A Little Help needed moving to and from a bed to a chair (including a wheelchair)?: A Little Help needed standing up from a chair using your arms (e.g., wheelchair or bedside chair)?: A Little Help needed to walk in hospital room?: Total Help needed climbing 3-5 steps with a railing? : Total 6 Click Score: 14    End of Session Equipment Utilized During Treatment: Gait belt;Oxygen Activity Tolerance: Patient tolerated treatment well;Other (comment) (limited by leaking rectal pouch) Patient left: in chair;with call  bell/phone within reach;with family/visitor present Nurse Communication: Mobility status PT Visit Diagnosis: Difficulty in walking, not elsewhere classified (R26.2);Other abnormalities of gait and mobility (R26.89)    Time: 1610-9604 PT Time Calculation (min) (ACUTE ONLY): 30 min   Charges:   PT Evaluation $PT Eval Moderate Complexity: 1 Mod PT Treatments $Therapeutic Activity: 8-22 mins PT General Charges $$ ACUTE PT VISIT: 1 Visit         [facility] PT Acute Rehabilitation Services Office [phone]   [name] [facility] 10/13/2023, 3:17 PM

## 2023-10-13 NOTE — Progress Notes (Signed)
 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  Patient noted to have distended abdomen, Tachypnic and Tachycardiac upon arrival to ED and he was hypoxic.  A CT abdomen/pelvis and CTA for Pulmonary emboli were done.  He was noted to have frequent PVCs and possible short runs of V Tach and Amiodarone  drip was started.  CT scan abd-no definitive obstruction and CTA no Pulmonary emboli.  Patient noted to have abdominal distension and hypoxia post op as well during several days of recovery with one day where he had some vomiting and PT was canceled.  Pertinent  Medical History  Abdominal hernia, Arthritis, Benign localized prostatic hyperplasia with lower urinary tract symptoms (LUTS), CAD (coronary artery disease) (05/2004), Dyslipidemia, Essential hypertension, Heart murmur, History of COVID-19 (05/2020), History of ST elevation myocardial infarction (STEMI) (02/16/2009), History of urinary retention, 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(HCC) (04/2021), OSA (obstructive sleep apnea), PONV (postoperative nausea and vomiting), Pre-diabetes, and S/p bare metal coronary artery stent.   Significant Hospital Events: Including procedures, antibiotic start and stop dates in addition to other pertinent events   Transfer from [facility] to ICU [facility]  Interim History / Subjective:  N/a  Objective    Blood pressure 138/64, pulse 100, temperature 98.2 F (36.8 C), temperature source Oral, resp. rate (!) 24, weight 101.4 kg, SpO2 91%.        Intake/Output Summary (Last 24 hours) at 10/13/2023 8295 Last data filed at 10/13/2023 0600 Gross per 24 hour   Intake 957.79 ml  Output 4600 ml  Net -3642.21 ml   Filed Weights   10/10/23 0500 10/11/23 0418 10/12/23 0500  Weight: 98.1 kg 99.9 kg 101.4 kg    Examination: Awake, alert, oriented Breathing non labored Breath sounds clear, no wheezes On HHFNC Abdomen distended, nontender, hypoactive bowel sounds Tachycardic, regular No peripheral edema  Labs/imaging personally reviewed     Resolved problem list   Assessment and Plan   Acute hypoxemic respiratory failure Likely in the setting of hypoventilation from markedly distended abdomen. On 3L of HFNC, improving  Opioid Induced Ileus Driving factor behind all of his symptoms currently. NG tube placed yesterday with good amount of output, abdominal distension is improving as well. Still passing stools. Placed PT order for ambulation. Treating with reglan  and neostigmine. Discussed about nutrition as well, as he hasn't been eating well since he had surgery on 5/9, will place RD consult as recommendations per GI are currently clear liquid.   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 Will continue amio for now until electrolytes are stable. Once electrolytes are stable will need to restart his home medications.   Best Practice (right click and "Reselect all SmartList Selections" daily)   Diet/type: clear liquids and advance as tolerated DVT prophylaxis prophylactic heparin   Pressure ulcer(s): N/A GI prophylaxis: N/A Lines:  N/A Foley:  N/A Code Status:  full code  I spent 50 minutes in total visit time for this patient, with more than 50% spent counseling/coordinating care.  Frederica Chrestman, MD  [facility]  Internal Medicine Resident, PGY-2  If no response to pager, please call critical care on call (see AMION) until 7pm After 7:00 pm call Elink

## 2023-10-13 NOTE — Progress Notes (Signed)
 [facility] ROUNDING NOTE   Subjective: Patient feeling better this morning.  NG tube had malfunction last night, but is currently working and had 1.5 L of output overnight, 2.7 L total yesterday.  600 mL liquid stool recorded.  Patient received 2 mg of neostigmine yesterday evening, in addition to Relistor  and Reglan .  Plain film this morning showed diffuse dilated loops of small bowel, with some colonic dilation as well   Objective: Vital signs in last 24 hours: Temp:  [98.1 F (36.7 C)-99 F (37.2 C)] 98.2 F (36.8 C) (05/21 0735) Pulse Rate:  [75-107] 100 (05/21 0800) Resp:  [17-36] 24 (05/21 0800) BP: (102-149)/(51-134) 138/64 (05/21 0800) SpO2:  [80 %-98 %] 91 % (05/21 0800) Last BM Date : 10/13/23 General: NAD, pleasant Caucasian male Lungs:  CTA b/l, no w/r/r Heart:  RRR, no m/r/g Abdomen: Distended, but soft, significant improvement from yesterday's exam.  Nontender.  Hypoactive bowel sounds Ext:  No c/c/e    Intake/Output from previous day: 05/20 0701 - 05/21 0700 In: 3280.2 [P.O.:160; I.V.:2014.6; IV Piggyback:705.6] Out: 4600 [Urine:1300; Emesis/NG output:2700; Stool:600] Intake/Output this shift: No intake/output data recorded.   Lab Results: Recent Labs    10/11/23 0847  WBC 3.5*  HGB 10.0*  PLT 473*  MCV 90.5   BMET Recent Labs    10/11/23 1600 10/12/23 0326 10/13/23 0159  NA 132* 132* 136  K 3.1* 3.6 3.8  CL 92* 94* 94*  CO2 27 29 31   GLUCOSE 80 94 125*  BUN 43* 45* 44*  CREATININE 1.19 1.13 0.84  CALCIUM  8.0* 8.1* 8.7*   LFT No results for input(s): "PROT", "ALBUMIN", "AST", "ALT", "ALKPHOS", "BILITOT", "BILIDIR", "IBILI" in the last 72 hours. PT/INR No results for input(s): "INR" in the last 72 hours.    Imaging/Other results: DG Abd 1 View Result Date: 10/13/2023 CLINICAL DATA:  Ileus EXAM: ABDOMEN - 1 VIEW COMPARISON:  None Available. FINDINGS: Multiple gas-filled loops dilated small bowel are seen throughout the  abdomen. Gas is also seen throughout the colon and together the findings favor a progressive adynamic ileus. Nasogastric tube tip overlies the proximal body of the stomach. No gross free intraperitoneal gas. IMPRESSION: 1. Progressive adynamic ileus. 2. Nasogastric tube tip within the proximal body of the stomach. Electronically Signed   By: Worthy Heads M.D.   On: 10/13/2023 06:06   DG Abd Portable 1V Result Date: 10/12/2023 CLINICAL DATA:  Feeding tube placement. EXAM: PORTABLE ABDOMEN - 1 VIEW COMPARISON:  Earlier today. FINDINGS: Interval nasogastric tube with its tip and side hole in the mid stomach. The included portion of the bowel demonstrates a similar pattern of colon and small bowel dilatation. Dense left lower lobe opacity compatible with atelectasis or pneumonia. Lumbar spine degenerative changes. IMPRESSION: 1. Nasogastric tube tip and side hole in the mid stomach. 2. Similar pattern of colon and small bowel probable ileus. 3. Dense left lower lobe atelectasis or pneumonia. Electronically Signed   By: Catherin Closs M.D.   On: 10/12/2023 15:21   DG Abd 1 View Result Date: 10/12/2023 CLINICAL DATA:  Nausea and vomiting with fluid-filled large and small bowel on a recent chest, abdomen and pelvis CT. EXAM: ABDOMEN - 1 VIEW COMPARISON:  Chest, abdomen and pelvis CT dated 10/09/2023 FINDINGS: Multiple mildly dilated small bowel and colon loops with an appearance similar to the amount of dilatation seen on 10/09/2023. Lumbar and lower thoracic spine degenerative changes. IMPRESSION: Similar pattern of probable colon and small bowel ileus. Electronically Signed  By: Catherin Closs M.D.   On: 10/12/2023 15:19      Assessment and Plan:  78 year old male status post total left knee replacement on Oct 01, 2023, with persistent symptoms of nausea, vomiting, constipation following his surgery, admitted on May 17 with tachypnea and hypoxia, noted to have frequent PVCs and short runs of V. tach requiring  amiodarone  drip.  CT scan with dilated loops of small bowel, gastric distention and dilated right colon.  Symptoms minimally improved with Relistor .  Neostigmine was administered 5/20, and his exam today was improved in terms of distention.  Prolonged postop ileus Presentation not consistent with Ogilvie's, and colonic distention limited to right colon with maximal diameter of 10 to 11 cm.  Diffuse small bowel dilation more prominent today -Recommend continue therapy with Relistor  12mg  [place] every day and Reglan  10 mg IV Q6hrs -Given improvement in abdominal distention with neostigmine, would consider an additional dose this evening if his distention worsens over the course of the day - Agree with another smog enema for rectal stimulation - Please continue to maintain normal electrolyte levels, avoid narcotics and anticholinergic medications - Encourage ambulation, out of bed activity is much as possible - Okay with trial of clear liquids    Elois Hair, MD  10/13/2023, 9:01 AM [facility]

## 2023-10-13 NOTE — Progress Notes (Signed)
 Pharmacy Electrolyte Replacement  Recent Labs:  Recent Labs    10/13/23 0159  K 3.8  MG 2.3  CREATININE 0.84    Low Critical Values (K </= 2.5, Phos </= 1, Mg </= 1) Present: None  MD Contacted: N/A  Plan: Give Kcl 10 mEq IV x 4 doses and repeat labs in AM  Juleen Oakland, PharmD PGY1 Pharmacy Resident 10/13/2023 6:31 AM

## 2023-10-13 NOTE — Progress Notes (Signed)
 Initial Nutrition Assessment  DOCUMENTATION CODES:   Not applicable  INTERVENTION:  TPN management to meet 100% of estimated nutritional needs. Management per pharmacy Monitor magnesium, potassium, and phosphorus BID for at least 3 days, MD to replete as needed, as pt is at risk for refeeding syndrome given prolonged poor PO intake. Recommend Thiamine 100 mg daily for 7 days  NUTRITION DIAGNOSIS:   Inadequate oral intake related to inability to eat as evidenced by NPO status.  GOAL:   Patient will meet greater than or equal to 90% of their needs  MONITOR:   Weight trends, Labs, I & O's, Diet advancement  REASON FOR ASSESSMENT:   Consult Assessment of nutrition requirement/status, New TPN/TNA  ASSESSMENT:   78 y.o. male presented to the ED with SOB. 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 PMH includes CAD, HTN, OSA, and malignant neoplasm of prostate. Pt admitted with acute respiratory failure and gastroenteritis.   5/18 - Admitted; diet advanced to clear liquids 5/20 - NPO; NGT - LIWS  Met with pt and wife in room. Reports that appetite and PO intake has been very poor since surgery on knee. Shares that pt has just been really snacking and not eating much, reports appetite was good prior to that. Endorses nausea and vomiting as well at home. Currently had a little bit of nausea. Reports a UBW of 205# and does not think he had any weight loss prior to surgery, suspects maybe a little since 2/2 not eating as well. Reports using a walker at home after surgery and no assistance prior to. Per EMR, pt with no weight loss over the past year.  Plan to start TPN as pt still with output from NGT. RD discussed plan with pt and wife.   Nutrition Related Medications: NovoLog  0-15 units q4h, Reglan , IV potassium chloride  Labs: Sodium 136, Potassium 3.8, BUN 44, Creatinine 0.84, Magnesium 2.3, Hgb A1c 6.4 (09/22/23)  CBG: 96-135 mg/dL x 24 hrs    UOP: 4098 mL x 24 hrs NGT output: 2700  mL x 24 hrs Stool output: 600 mL + 1 unmeasured occurrence x 24 hrs  NUTRITION - FOCUSED PHYSICAL EXAM:  Flowsheet Row Most Recent Value  Orbital Region No depletion  Upper Arm Region No depletion  Thoracic and Lumbar Region No depletion  Buccal Region No depletion  Temple Region No depletion  Clavicle Bone Region Mild depletion  Clavicle and Acromion Bone Region Mild depletion  Scapular Bone Region Mild depletion  Dorsal Hand No depletion  Patellar Region No depletion  Anterior Thigh Region No depletion  Posterior Calf Region No depletion  Edema (RD Assessment) None  Hair Reviewed  Eyes Reviewed  Mouth Reviewed  Skin Reviewed  Nails Reviewed   Diet Order:   Diet Order             Diet NPO time specified Except for: Ice Chips  Diet effective now                   EDUCATION NEEDS:   Education needs have been addressed  Skin:  Skin Assessment: Reviewed RN Assessment  Last BM:  5/21 - Type 7  Height:  Ht Readings from Last 1 Encounters:  10/01/23 5\' 9"  (1.753 m)   Weight:  Wt Readings from Last 1 Encounters:  10/13/23 98.3 kg   Ideal Body Weight:  72.7 kg  BMI:  Body mass index is 32 kg/m.  Estimated Nutritional Needs:  Kcal:  2200-2400 Protein:  110-130 grams Fluid:  >/=  2 L   Doneta Furbish RD, LDN Clinical Dietitian

## 2023-10-14 ENCOUNTER — Inpatient Hospital Stay (HOSPITAL_COMMUNITY)

## 2023-10-14 DIAGNOSIS — E876 Hypokalemia: Secondary | ICD-10-CM

## 2023-10-14 DIAGNOSIS — K567 Ileus, unspecified: Secondary | ICD-10-CM | POA: Diagnosis not present

## 2023-10-14 DIAGNOSIS — R638 Other symptoms and signs concerning food and fluid intake: Secondary | ICD-10-CM | POA: Diagnosis not present

## 2023-10-14 DIAGNOSIS — R140 Abdominal distension (gaseous): Secondary | ICD-10-CM | POA: Diagnosis not present

## 2023-10-14 DIAGNOSIS — K9189 Other postprocedural complications and disorders of digestive system: Secondary | ICD-10-CM | POA: Diagnosis not present

## 2023-10-14 LAB — COMPREHENSIVE METABOLIC PANEL WITH GFR
ALT: 34 U/L (ref 0–44)
AST: 25 U/L (ref 15–41)
Albumin: 2.1 g/dL — ABNORMAL LOW (ref 3.5–5.0)
Alkaline Phosphatase: 71 U/L (ref 38–126)
Anion gap: 12 (ref 5–15)
BUN: 36 mg/dL — ABNORMAL HIGH (ref 8–23)
CO2: 33 mmol/L — ABNORMAL HIGH (ref 22–32)
Calcium: 8.3 mg/dL — ABNORMAL LOW (ref 8.9–10.3)
Chloride: 93 mmol/L — ABNORMAL LOW (ref 98–111)
Creatinine, Ser: 0.84 mg/dL (ref 0.61–1.24)
GFR, Estimated: >60 mL/min (ref >60)
Glucose, Bld: 228 mg/dL — ABNORMAL HIGH (ref 70–99)
Potassium: 2.8 mmol/L — ABNORMAL LOW (ref 3.5–5.1)
Sodium: 138 mmol/L (ref 135–145)
Total Bilirubin: 0.9 mg/dL (ref 0.0–1.2)
Total Protein: 5.1 g/dL — ABNORMAL LOW (ref 6.5–8.1)

## 2023-10-14 LAB — RENAL FUNCTION PANEL
Albumin: 2.1 g/dL — ABNORMAL LOW (ref 3.5–5.0)
Anion gap: 10 (ref 5–15)
BUN: 29 mg/dL — ABNORMAL HIGH (ref 8–23)
CO2: 34 mmol/L — ABNORMAL HIGH (ref 22–32)
Calcium: 8.3 mg/dL — ABNORMAL LOW (ref 8.9–10.3)
Chloride: 96 mmol/L — ABNORMAL LOW (ref 98–111)
Creatinine, Ser: 0.7 mg/dL (ref 0.61–1.24)
GFR, Estimated: >60 mL/min (ref >60)
Glucose, Bld: 201 mg/dL — ABNORMAL HIGH (ref 70–99)
Phosphorus: 2.8 mg/dL (ref 2.5–4.6)
Potassium: 3.3 mmol/L — ABNORMAL LOW (ref 3.5–5.1)
Sodium: 140 mmol/L (ref 135–145)

## 2023-10-14 LAB — MAGNESIUM
Magnesium: 2.4 mg/dL (ref 1.7–2.4)
Magnesium: 2.4 mg/dL (ref 1.7–2.4)

## 2023-10-14 LAB — PHOSPHORUS: Phosphorus: 2.3 mg/dL — ABNORMAL LOW (ref 2.5–4.6)

## 2023-10-14 LAB — GLUCOSE, CAPILLARY
Glucose-Capillary: 218 mg/dL — ABNORMAL HIGH (ref 70–99)
Glucose-Capillary: 244 mg/dL — ABNORMAL HIGH (ref 70–99)
Glucose-Capillary: 211 mg/dL — ABNORMAL HIGH (ref 70–99)
Glucose-Capillary: 199 mg/dL — ABNORMAL HIGH (ref 70–99)
Glucose-Capillary: 179 mg/dL — ABNORMAL HIGH (ref 70–99)

## 2023-10-14 MED ORDER — POTASSIUM CHLORIDE 20 MEQ PO PACK
60.0000 meq | PACK | Freq: Once | ORAL | Status: AC
  Administered 2023-10-14: 60 meq via ORAL
  Filled 2023-10-14: qty 3

## 2023-10-14 MED ORDER — ALTEPLASE 2 MG IJ SOLR
2.0000 mg | Freq: Once | INTRAMUSCULAR | Status: AC
  Administered 2023-10-15: 2 mg
  Filled 2023-10-14: qty 2

## 2023-10-14 MED ORDER — POTASSIUM CHLORIDE 10 MEQ/100ML IV SOLN
10.0000 meq | INTRAVENOUS | Status: AC
  Administered 2023-10-14 (×6): 10 meq via INTRAVENOUS
  Filled 2023-10-14 (×6): qty 100

## 2023-10-14 MED ORDER — CHLORHEXIDINE GLUCONATE CLOTH 2 % EX PADS
6.0000 | MEDICATED_PAD | CUTANEOUS | Status: DC
  Administered 2023-10-14 – 2023-10-26 (×13): 6 via TOPICAL

## 2023-10-14 MED ORDER — METHYLNALTREXONE BROMIDE 12 MG/0.6ML ~~LOC~~ SOLN
12.0000 mg | Freq: Every day | SUBCUTANEOUS | Status: AC
  Administered 2023-10-14 – 2023-10-15 (×2): 12 mg via SUBCUTANEOUS
  Filled 2023-10-14 (×2): qty 0.6

## 2023-10-14 MED ORDER — ALTEPLASE 2 MG IJ SOLR
2.0000 mg | Freq: Once | INTRAMUSCULAR | Status: AC
  Administered 2023-10-14: 2 mg
  Filled 2023-10-14: qty 2

## 2023-10-14 MED ORDER — ORAL CARE MOUTH RINSE: 15.0000 mL | OROMUCOSAL | Status: DC | PRN

## 2023-10-14 MED ORDER — POTASSIUM PHOSPHATES 15 MMOLE/5ML IV SOLN
30.0000 mmol | Freq: Once | INTRAVENOUS | Status: AC
  Administered 2023-10-14: 30 mmol via INTRAVENOUS
  Filled 2023-10-14: qty 10

## 2023-10-14 MED ORDER — TRAVASOL 10 % IV SOLN
INTRAVENOUS | Status: AC
  Filled 2023-10-14: qty 537.6

## 2023-10-14 MED ORDER — INSULIN ASPART 100 UNIT/ML IJ SOLN
0.0000 [IU] | INTRAMUSCULAR | Status: DC
  Administered 2023-10-14 (×2): 4 [IU] via SUBCUTANEOUS
  Administered 2023-10-14: 7 [IU] via SUBCUTANEOUS
  Administered 2023-10-15: 3 [IU] via SUBCUTANEOUS
  Administered 2023-10-15: 4 [IU] via SUBCUTANEOUS
  Administered 2023-10-15: 3 [IU] via SUBCUTANEOUS
  Administered 2023-10-15 (×2): 7 [IU] via SUBCUTANEOUS
  Administered 2023-10-16: 4 [IU] via SUBCUTANEOUS
  Administered 2023-10-16: 3 [IU] via SUBCUTANEOUS
  Administered 2023-10-16: 4 [IU] via SUBCUTANEOUS
  Administered 2023-10-16: 7 [IU] via SUBCUTANEOUS
  Administered 2023-10-16: 4 [IU] via SUBCUTANEOUS
  Administered 2023-10-17 (×5): 3 [IU] via SUBCUTANEOUS
  Administered 2023-10-17: 4 [IU] via SUBCUTANEOUS
  Administered 2023-10-18: 3 [IU] via SUBCUTANEOUS
  Administered 2023-10-18: 4 [IU] via SUBCUTANEOUS
  Administered 2023-10-18: 3 [IU] via SUBCUTANEOUS
  Administered 2023-10-18: 7 [IU] via SUBCUTANEOUS
  Administered 2023-10-18: 3 [IU] via SUBCUTANEOUS
  Administered 2023-10-18 – 2023-10-19 (×5): 4 [IU] via SUBCUTANEOUS
  Administered 2023-10-19 – 2023-10-20 (×4): 3 [IU] via SUBCUTANEOUS
  Administered 2023-10-20: 4 [IU] via SUBCUTANEOUS
  Administered 2023-10-20 (×2): 3 [IU] via SUBCUTANEOUS
  Administered 2023-10-21: 4 [IU] via SUBCUTANEOUS
  Administered 2023-10-21: 3 [IU] via SUBCUTANEOUS

## 2023-10-14 MED ORDER — POTASSIUM CHLORIDE 10 MEQ/50ML IV SOLN
10.0000 meq | INTRAVENOUS | Status: AC
  Administered 2023-10-14 – 2023-10-15 (×6): 10 meq via INTRAVENOUS
  Filled 2023-10-14 (×6): qty 50

## 2023-10-14 MED ORDER — POTASSIUM CHLORIDE 10 MEQ/50ML IV SOLN
10.0000 meq | INTRAVENOUS | Status: DC
  Filled 2023-10-14 (×4): qty 50

## 2023-10-14 NOTE — Progress Notes (Signed)
 Psychosocial Progressive/Outcome: ANOx4, calm and cooperative  Wife at bedside   Pain/Comfort Progression/Outcome: Pt reported that abdomen was sore when palpating   Clinical Progression/Outcome: NPO- except ice chips  Independent in positioning, but needs encouragement  X1 assist w/ walker  L PICC, TPN running  NG tube maintained, low continuous suction  BM to flexi seal  Slept in between care Maintained safety

## 2023-10-14 NOTE — Progress Notes (Signed)
 PHARMACY - TOTAL PARENTERAL NUTRITION CONSULT NOTE   Indication: Prolonged ileus  Patient Measurements: Weight: 96.7 kg (213 lb 3 oz)   Body mass index is 31.48 kg/m. Usual Weight: 98.3 kg  Assessment:  [name] who presents with abdominal distention in the setting of prolonged ileus. Patient had surgery on his knee on May 9 and was taking oxycodone  at home for pain. Endorses that intake has declined significantly since his procedure. Prior to procedure he was eating 3 meals a day which consisted of coffee and toast for breakfast, a veggie platter and meat for lunch, and a dinner prepared by his wife. Does not remember intake after surgery very well but states he was eating very little as nothing was appealing to him.   Has been receiving Relistor , Reglan , and neostigmine in combination with SMOG enemas, however has persistent bowel dilation. No bowel sounds present on physical exam. Pharmacy has been consulted to initiate TPN in setting of prolonged ileus and minimal intake.   Glucose / Insulin : CBGs now elevated, 218-244, 13 units of SSI utilized 5/21 Electrolytes: K 2.8 -- Kcl 10 mEq IV x 6 doses ordered, phos 2.3 -- Kphos 30 mmol IV x 1 dose ordered, Mg 2.1, Cl 93 (low), CO2 33 Renal: Scr 0.84 Hepatic: LFTs WNL Intake / Output; MIVF: 995 mL stool output (diarrhea), 2.75 L NG output 5/21, attempted sips of mountain dew and water  overnight, continued to have nausea/vomiting GI Imaging: KUB 5/21 with progressive adynamic ileus; no new imaging 5/22 GI Surgeries / Procedures: N/A  Central access: PICC (placed 5/21) TPN start date: 5/21  Nutritional Goals: Goal TPN rate is 90 mL/hr (provides 121 g of protein and 78469 kcals per day)  RD Assessment: Estimated Needs Total Energy Estimated Needs: 2200-2400 Total Protein Estimated Needs: 110-130 grams Total Fluid Estimated Needs: >/= 2 L  Current Nutrition:  Clear liquids and TPN, attempted sips of Mountain Dew and water  overnight but  didn't tolerate and continued to have N/V  Plan:  Continue TPN at 40 mL/hr at 1800 -- continue at current rate due to possible refeeding syndrome and also large amounts of loss via NG tube and stool output Electrolytes in TPN: Na 32mEq/L, K 54mEq/L, Ca 25mEq/L, Mg 86mEq/L, and Phos 15mmol/L. Cl:Ac -- maximize chloride Add standard MVI and trace elements to TPN Continue to add thiamine to TPN daily for 7 total days (5/27 stop date) Increase to Resistant q4h SSI and adjust as needed  Monitor TPN labs on Mon/Thurs, electrolytes BID for a few days in setting of losses and possible refeeding syndrome  Juleen Oakland, PharmD PGY1 Pharmacy Resident 10/14/2023 8:14 AM

## 2023-10-14 NOTE — Progress Notes (Addendum)
 NAME:  Chase Combs, MRN:  [id number], DOB:  Oct 26, 1945, LOS: 4 ADMISSION DATE:  10/09/2023, CONSULTATION DATE:  10/10/2023 REFERRING MD:  Cris Dollar, PA-C, CHIEF COMPLAINT:  SOB  History of Present Illness:  78 y/o make with PMH significant for Left TKR on 10/01/2023 for osteoarthritis, CAD S/p MI s/p coronary stent x 2, who presented to [facility] with c/o feeling "unwell" x 1 week since his TKR.  Patient noted to have distended abdomen, Tachypnic and Tachycardiac upon arrival to ED and he was hypoxic.  A CT abdomen/pelvis and CTA for Pulmonary emboli were done.  He was noted to have frequent PVCs and possible short runs of V Tach and Amiodarone  drip was started.  CT scan abd-no definitive obstruction and CTA no Pulmonary emboli.  Patient noted to have abdominal distension and hypoxia post op as well during several days of recovery with one day where he had some vomiting and PT was canceled.  Pertinent  Medical History  Abdominal hernia, Arthritis, Benign localized prostatic hyperplasia with lower urinary tract symptoms (LUTS), CAD (coronary artery disease) (05/2004), Dyslipidemia, Essential hypertension, Heart murmur, History of COVID-19 (05/2020), History of ST elevation myocardial infarction (STEMI) (02/16/2009), History of urinary retention, 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(HCC) (04/2021), OSA (obstructive sleep apnea), PONV (postoperative nausea and vomiting), Pre-diabetes, and S/p bare metal coronary artery stent.   Significant Hospital Events: Including procedures, antibiotic start and stop dates in addition to other pertinent events   Transfer from [facility] to ICU [facility]  Interim History / Subjective:  N/a  Objective    Blood pressure 117/87, pulse (!) 111, temperature 97.7 F (36.5 C), temperature source Oral, resp. rate (!) 26, weight 96.7 kg, SpO2 99%.        Intake/Output Summary (Last 24 hours) at 10/14/2023 0813 Last data filed at 10/14/2023 0700 Gross per 24 hour   Intake 1373.45 ml  Output 5295 ml  Net -3921.55 ml   Filed Weights   10/12/23 0500 10/13/23 8295 10/14/23 0500  Weight: 101.4 kg 98.3 kg 96.7 kg    Examination: Awake, alert, oriented Breathing non labored Breath sounds clear, no wheezes On HHFNC Abdomen distended, improving, nontender, hypoactive bowel sounds Tachycardic, regular No peripheral edema  Labs/imaging personally reviewed     Resolved problem list   Assessment and Plan   Acute hypoxemic respiratory failure Likely in the setting of hypoventilation from markedly distended abdomen. On 2L of HFNC, improving, may be able to wean completely off today.  Opioid Induced Ileus Risk of Refeeding Syndrome Driving factor behind all of his symptoms currently. NG tube placed two days ago still having a lot of output, as well as liquid stools. He has been ambulating and working with physical therapy, and is currently sitting up in his chair. Clinically, he has had improvement in his abdominal distension, with decreased tenderness and I can hear more bowel sounds. He has been tolerating his clear liquid diet well, and has been having increase in his bowel movements which may be due to the enemas, reglan , and neostigmine. PICC line was placed yesterday, will need to monitor electrolytes carefully, as he is at risk for refeeding syndrome.    Plan:  - Appreciate GI recommendations - Continue Relistor  12mg  daily and Reglan  10mg  Q6hrs - Monitor electrolytes carefully, replete as necessary - Stable for transfer out of the ICU   CAD s/p CABG Frequent PVCs and NSVT HTN Likely in the setting of electrolyte abnormalities as stated above. Currently NPO, if is able  to tolerate PO and advancing diet (clear liquid today), may restart his beta blocker.   Best Practice (right click and "Reselect all SmartList Selections" daily)   Diet/type: clear liquids and advance as tolerated DVT prophylaxis prophylactic heparin   Pressure ulcer(s):  N/A GI prophylaxis: N/A Lines: N/A Foley:  N/A Code Status:  full code  I spent 50 minutes in total visit time for this patient, with more than 50% spent counseling/coordinating care.  Addaleigh Nicholls, MD  [facility] Internal Medicine Resident, PGY-2  If no response to pager, please call critical care on call (see AMION) until 7pm After 7:00 pm call Elink

## 2023-10-14 NOTE — Progress Notes (Addendum)
 Arrived to remove alteplase from purple port LUA PICC line. IVF's infusing to purple port. Found dead end cap and label removed. Assessed for blood return. No blood return. Primary RN notified of need to have line cleared by VAST before using when alteplse instilled. Agarwala MD notified for additional dose of alteplase for purple lumen of PICC.

## 2023-10-14 NOTE — Progress Notes (Signed)
 Physical Therapy Treatment Patient Details Name: Chase Combs MRN: [id number] DOB: June 14, 1945 Today's Date: 10/14/2023   History of Present Illness 78 yo male presented to [facility] ED 10/09/23 for N/V and abdominal distension. Pt with ileus, short runs of Vtach, and hypoxic respiratory failure.  10/10/23 transfer to [facility]. PMhx: 10/01/23 Lt TKA, CAD, coronary stent, obesity, HTN, prostate CA, OSA    PT Comments  Pt pleasant and willing to move but limited by leaking flexiseal and fatigue. Pt educated for knee straight at rest and continued progression of HEP and gait daily. Will continue to follow.  HR 110-125 SPO2 95% on 3L    If plan is discharge home, recommend the following: A little help with walking and/or transfers;A little help with bathing/dressing/bathroom;Assistance with cooking/housework;Assist for transportation   Can travel by private vehicle        Equipment Recommendations  None recommended by PT    Recommendations for Other Services       Precautions / Restrictions Precautions Precautions: Fall;Knee;Other (comment) Precaution/Restrictions Comments: NG tube, flexiseal Restrictions LLE Weight Bearing Per Provider Order: Weight bearing as tolerated     Mobility  Bed Mobility Overal bed mobility: Needs Assistance Bed Mobility: Sit to Supine       Sit to supine: Min assist   General bed mobility comments: min assist to lift leg to surface with cues for sequence and safety    Transfers Overall transfer level: Needs assistance   Transfers: Sit to/from Stand Sit to Stand: Min assist           General transfer comment: min assist to rise from recliner x 2 trials with cues for hand placement. Initial stand with incontinent stool around flexiseal requiring seated rest for linen change prior to gait    Ambulation/Gait Ambulation/Gait assistance: Contact guard assist Gait Distance (Feet): 100 Feet Assistive device: Rolling walker (2 wheels) Gait  Pattern/deviations: Step-through pattern, Decreased stride length   Gait velocity interpretation: 1.31 - 2.62 ft/sec, indicative of limited community ambulator   General Gait Details: cues for posture and distance, limited by fatigue   Stairs             Wheelchair Mobility     Tilt Bed    Modified Rankin (Stroke Patients Only)       Balance Overall balance assessment: Needs assistance Sitting-balance support: No upper extremity supported Sitting balance-Leahy Scale: Fair     Standing balance support: Bilateral upper extremity supported, During functional activity, Reliant on assistive device for balance Standing balance-Leahy Scale: Poor Standing balance comment: Rw in standing                            Communication    Cognition Arousal: Alert Behavior During Therapy: WFL for tasks assessed/performed   PT - Cognitive impairments: Safety/Judgement                       PT - Cognition Comments: pt repeatedly grabbing lines despite cues for therapist management Following commands: Intact      Cueing Cueing Techniques: Verbal cues  Exercises Total Joint Exercises Long Arc Quad: AROM, Left, Seated, 20 reps Marching in Standing: AROM, Left, Seated, 20 reps    General Comments        Pertinent Vitals/Pain Pain Assessment Pain Assessment: No/denies pain    Home Living  Prior Function            PT Goals (current goals can now be found in the care plan section) Progress towards PT goals: Progressing toward goals    Frequency    Min 2X/week      PT Plan      Co-evaluation              AM-PAC PT "6 Clicks" Mobility   Outcome Measure  Help needed turning from your back to your side while in a flat bed without using bedrails?: A Little Help needed moving from lying on your back to sitting on the side of a flat bed without using bedrails?: A Little Help needed moving to and from a  bed to a chair (including a wheelchair)?: A Little Help needed standing up from a chair using your arms (e.g., wheelchair or bedside chair)?: A Little Help needed to walk in hospital room?: A Little Help needed climbing 3-5 steps with a railing? : A Lot 6 Click Score: 17    End of Session Equipment Utilized During Treatment: Gait belt;Oxygen Activity Tolerance: Patient tolerated treatment well Patient left: in bed;with call bell/phone within reach;with nursing/sitter in room Nurse Communication: Mobility status;Precautions;Weight bearing status PT Visit Diagnosis: Difficulty in walking, not elsewhere classified (R26.2);Other abnormalities of gait and mobility (R26.89)     Time: 1610-9604 PT Time Calculation (min) (ACUTE ONLY): 25 min  Charges:    $Gait Training: 8-22 mins $Therapeutic Activity: 8-22 mins PT General Charges $$ ACUTE PT VISIT: 1 Visit                     Annis Baseman, PT Acute Rehabilitation Services Office: [phone]    [name] 10/14/2023, 10:39 AM

## 2023-10-14 NOTE — Progress Notes (Addendum)
 Pharmacy Electrolyte Replacement (pt on TPN)  Recent Labs:  Recent Labs    10/14/23 1700  K 3.3*  MG 2.4  PHOS 2.8  CREATININE 0.70    Low Critical Values (K </= 2.5, Phos </= 1, Mg </= 1) Present: None   Plan:  Give KCl 10mEq/50ml q1h x 6 more doses F/u TPN labs in a.m.  Enrigue Harvard, PharmD, BCPS Please see amion for complete clinical pharmacist phone list 10/14/2023 7:49 PM

## 2023-10-14 NOTE — Progress Notes (Signed)
 Patient ID: Chase Combs, male   DOB: 1946/01/05, 78 y.o.   MRN: [id number]    Progress Note   Subjective   Day # 5 CC; post op diffuse ileus  PICC line placed/TPN initiating Reglan  IV every 6 Relistor  12 mg daily  Flexiseal with liquid stool -600 cc out thus fare today  NG to suction -about 800 out today    Labs today-potassium 2.8/glucose 228/BUN 36/creatinine 0.84 Albumin 2.1 LFTs within normal limits  Pt still miserable- says he doesn't feel he is getting better- frustrated as cannot sleep. No Nausea, abdomen does feel better , was up with PT this am.    Objective   Vital signs in last 24 hours: Temp:  [97.7 F (36.5 C)-99 F (37.2 C)] 97.7 F (36.5 C) (05/22 0711) Pulse Rate:  [76-122] 84 (05/22 0900) Resp:  [22-32] 23 (05/22 0900) BP: (114-160)/(46-90) 119/60 (05/22 0900) SpO2:  [88 %-99 %] 95 % (05/22 0900) Weight:  [96.7 kg] 96.7 kg (05/22 0500) Last BM Date : 10/14/23 General:    elderly WM  in NAD fatiqued  Heart:  Regular rate and rhythm; no murmurs Lungs: Respirations even and unlabored, lungs CTA bilaterally Abdomen:  much softer on exam today , still distended but not tense, non tender- BS still very quiet Extremities:  L knee. With bruising Neurologic:  Alert and oriented,  grossly normal neurologically. Psych:  Cooperative. Normal mood and affect.  Intake/Output from previous day: 05/21 0701 - 05/22 0700 In: 1373.5 [I.V.:549.9; NG/GT:60; IV Piggyback:763.6] Out: 5445 [Urine:1300; Emesis/NG output:3150; Stool:995] Intake/Output this shift: Total I/O In: 368.4 [I.V.:79.9; NG/GT:50; IV Piggyback:238.6] Out: 450 [Urine:50; Emesis/NG output:400]  Lab Results: No results for input(s): "WBC", "HGB", "HCT", "PLT" in the last 72 hours. BMET Recent Labs    10/12/23 0326 10/13/23 0159 10/14/23 0323  NA 132* 136 138  K 3.6 3.8 2.8*  CL 94* 94* 93*  CO2 29 31 33*  GLUCOSE 94 125* 228*  BUN 45* 44* 36*  CREATININE 1.13 0.84 0.84  CALCIUM  8.1*  8.7* 8.3*   LFT Recent Labs    10/14/23 0323  PROT 5.1*  ALBUMIN 2.1*  AST 25  ALT 34  ALKPHOS 71  BILITOT 0.9   PT/INR No results for input(s): "LABPROT", "INR" in the last 72 hours.  Studies/Results: US  EKG SITE RITE Result Date: 10/13/2023 If Site Rite image not attached, placement could not be confirmed due to current cardiac rhythm.  DG Abd 1 View Result Date: 10/13/2023 CLINICAL DATA:  Ileus EXAM: ABDOMEN - 1 VIEW COMPARISON:  None Available. FINDINGS: Multiple gas-filled loops dilated small bowel are seen throughout the abdomen. Gas is also seen throughout the colon and together the findings favor a progressive adynamic ileus. Nasogastric tube tip overlies the proximal body of the stomach. No gross free intraperitoneal gas. IMPRESSION: 1. Progressive adynamic ileus. 2. Nasogastric tube tip within the proximal body of the stomach. Electronically Signed   By: Worthy Heads M.D.   On: 10/13/2023 06:06   DG Abd Portable 1V Result Date: 10/12/2023 CLINICAL DATA:  Feeding tube placement. EXAM: PORTABLE ABDOMEN - 1 VIEW COMPARISON:  Earlier today. FINDINGS: Interval nasogastric tube with its tip and side hole in the mid stomach. The included portion of the bowel demonstrates a similar pattern of colon and small bowel dilatation. Dense left lower lobe opacity compatible with atelectasis or pneumonia. Lumbar spine degenerative changes. IMPRESSION: 1. Nasogastric tube tip and side hole in the mid stomach. 2. Similar pattern of colon and  small bowel probable ileus. 3. Dense left lower lobe atelectasis or pneumonia. Electronically Signed   By: Catherin Closs M.D.   On: 10/12/2023 15:21   DG Abd 1 View Result Date: 10/12/2023 CLINICAL DATA:  Nausea and vomiting with fluid-filled large and small bowel on a recent chest, abdomen and pelvis CT. EXAM: ABDOMEN - 1 VIEW COMPARISON:  Chest, abdomen and pelvis CT dated 10/09/2023 FINDINGS: Multiple mildly dilated small bowel and colon loops with an  appearance similar to the amount of dilatation seen on 10/09/2023. Lumbar and lower thoracic spine degenerative changes. IMPRESSION: Similar pattern of probable colon and small bowel ileus. Electronically Signed   By: Catherin Closs M.D.   On: 10/12/2023 15:19       Assessment / Plan:    #34  78 year old white male status post left total knee replacement on 10/01/2023, admitted with nausea vomiting obstipation and on admission noted to have tachypnea, hypoxia, frequent PVCs and short runs of V. tach requiring amiodarone  drip. CT imaging showing diffuse ileus with dilated loops of small bowel, gastric distention and dilated right colon.  He has required NG tube placement, daily Relistor , and neostigmine on 10/12/2023, smog enemas Now on IV Reglan  10 mg every 6 hours  He is making slow progress, stooling via Flexi-Seal with large amount of liquid stool.  Still having significant NG output and bowel sounds remain very quiet  #2 nutrition/malnutrition-PICC line placed yesterday TPN has been started  #3 recurrent hypokalemia-down to 2.8 again today, receiving IV potassium/magnesium pending  Plan; continue efforts with out of bed and ambulation Sips of clears for comfort Continue NG at low suction Repeat abdominal films in a.m. Continue IV Reglan  10 every 6 Will give another dose of Relistor  today and again tomorrow Continue electrolyte replacement, try to keep potassium closer to 4, replace magnesium and phosphorus as indicated     Principal Problem:   Hypoxia     LOS: 4 days   Jazariah Teall EsterwoodPA-C 10/14/2023, 10:10 AM

## 2023-10-15 ENCOUNTER — Inpatient Hospital Stay (HOSPITAL_COMMUNITY)

## 2023-10-15 DIAGNOSIS — R140 Abdominal distension (gaseous): Secondary | ICD-10-CM | POA: Diagnosis not present

## 2023-10-15 DIAGNOSIS — R509 Fever, unspecified: Secondary | ICD-10-CM

## 2023-10-15 DIAGNOSIS — R410 Disorientation, unspecified: Secondary | ICD-10-CM

## 2023-10-15 DIAGNOSIS — K567 Ileus, unspecified: Secondary | ICD-10-CM | POA: Diagnosis not present

## 2023-10-15 DIAGNOSIS — K9189 Other postprocedural complications and disorders of digestive system: Secondary | ICD-10-CM | POA: Diagnosis not present

## 2023-10-15 LAB — CBC
HCT: 31.2 % — ABNORMAL LOW (ref 39.0–52.0)
Hemoglobin: 10.2 g/dL — ABNORMAL LOW (ref 13.0–17.0)
MCH: 29.9 pg (ref 26.0–34.0)
MCHC: 32.7 g/dL (ref 30.0–36.0)
MCV: 91.5 fL (ref 80.0–100.0)
Platelets: 520 10*3/uL — ABNORMAL HIGH (ref 150–400)
RBC: 3.41 MIL/uL — ABNORMAL LOW (ref 4.22–5.81)
RDW: 15.2 % (ref 11.5–15.5)
WBC: 10.2 10*3/uL (ref 4.0–10.5)
nRBC: 0.7 % — ABNORMAL HIGH (ref 0.0–0.2)

## 2023-10-15 LAB — RENAL FUNCTION PANEL
Albumin: 2.1 g/dL — ABNORMAL LOW (ref 3.5–5.0)
Anion gap: 9 (ref 5–15)
BUN: 22 mg/dL (ref 8–23)
CO2: 32 mmol/L (ref 22–32)
Calcium: 8.3 mg/dL — ABNORMAL LOW (ref 8.9–10.3)
Chloride: 99 mmol/L (ref 98–111)
Creatinine, Ser: 0.68 mg/dL (ref 0.61–1.24)
GFR, Estimated: >60 mL/min (ref >60)
Glucose, Bld: 238 mg/dL — ABNORMAL HIGH (ref 70–99)
Phosphorus: 2.7 mg/dL (ref 2.5–4.6)
Potassium: 3.2 mmol/L — ABNORMAL LOW (ref 3.5–5.1)
Sodium: 140 mmol/L (ref 135–145)

## 2023-10-15 LAB — GLUCOSE, CAPILLARY
Glucose-Capillary: 216 mg/dL — ABNORMAL HIGH (ref 70–99)
Glucose-Capillary: 215 mg/dL — ABNORMAL HIGH (ref 70–99)
Glucose-Capillary: 201 mg/dL — ABNORMAL HIGH (ref 70–99)
Glucose-Capillary: 202 mg/dL — ABNORMAL HIGH (ref 70–99)
Glucose-Capillary: 170 mg/dL — ABNORMAL HIGH (ref 70–99)
Glucose-Capillary: 143 mg/dL — ABNORMAL HIGH (ref 70–99)
Glucose-Capillary: 149 mg/dL — ABNORMAL HIGH (ref 70–99)
Glucose-Capillary: 138 mg/dL — ABNORMAL HIGH (ref 70–99)

## 2023-10-15 LAB — CULTURE, BLOOD (ROUTINE X 2)
Culture: NO GROWTH
Culture: NO GROWTH
Special Requests: ADEQUATE
Special Requests: ADEQUATE

## 2023-10-15 LAB — MAGNESIUM: Magnesium: 2.5 mg/dL — ABNORMAL HIGH (ref 1.7–2.4)

## 2023-10-15 MED ORDER — POTASSIUM CHLORIDE 10 MEQ/100ML IV SOLN
10.0000 meq | INTRAVENOUS | Status: AC
  Administered 2023-10-15 – 2023-10-16 (×6): 10 meq via INTRAVENOUS
  Filled 2023-10-15 (×6): qty 100

## 2023-10-15 MED ORDER — DEXTROSE 10 % IV SOLN: INTRAVENOUS | Status: DC

## 2023-10-15 MED ORDER — POTASSIUM CHLORIDE 10 MEQ/100ML IV SOLN: 10.0000 meq | INTRAVENOUS | Status: DC

## 2023-10-15 MED ORDER — POTASSIUM CHLORIDE 10 MEQ/50ML IV SOLN
10.0000 meq | INTRAVENOUS | Status: AC
Start: 2023-10-15 — End: 2023-10-15
  Administered 2023-10-15 (×2): 10 meq via INTRAVENOUS
  Filled 2023-10-15 (×6): qty 50

## 2023-10-15 MED ORDER — TRAVASOL 10 % IV SOLN
INTRAVENOUS | Status: AC
  Filled 2023-10-15: qty 806.4

## 2023-10-15 MED ORDER — METOPROLOL TARTRATE 5 MG/5ML IV SOLN
5.0000 mg | Freq: Four times a day (QID) | INTRAVENOUS | Status: DC
  Administered 2023-10-15 – 2023-10-23 (×32): 5 mg via INTRAVENOUS
  Filled 2023-10-15 (×32): qty 5

## 2023-10-15 NOTE — Plan of Care (Signed)

## 2023-10-15 NOTE — Progress Notes (Signed)
 Picc tip is curled up slightly in the svc, power flush done to attempt to reposition the picc tip . Will need another chest xray to verify picc tip.

## 2023-10-15 NOTE — Progress Notes (Signed)
 Psychosocial Progressive/Outcome: ANOx4, calm and cooperative  Wife and friends at bedside    Pain/Comfort Progression/Outcome: Pt reported that abdomen was sore when palpating    Clinical Progression/Outcome: NPO- except ice chips  Independent in positioning, but needs encouragement  X1 assist w/ walker  L PICC, TPN running  NG tube maintained, tube clamped  BM to flexi seal - changed bag twice during shift  Slept in between care Maintained safety

## 2023-10-15 NOTE — Progress Notes (Signed)
 OT Cancellation Note  Patient Details Name: Chase Combs MRN: [id number] DOB: Oct 01, 1945   Cancelled Treatment:    Reason Eval/Treat Not Completed: Patient declined, no reason specified (Pt respectfully declined, states that "he needs to get over this hump" but reports performing BUE/BLE exercises while being in bed already. OT to follow-up with pt as able)  10/15/2023  AB, OTR/L  Acute Rehabilitation Services  Office: [phone]   Jorene New 10/15/2023, 5:22 PM

## 2023-10-15 NOTE — Progress Notes (Signed)
 Physical Therapy Treatment Patient Details Name: Chase Combs MRN: [id number] DOB: [birth year] Today's Date: 10/15/2023   History of Present Illness 78 yo male presented to [facility] 10/09/23 for N/V and abdominal distension. Pt with ileus, short runs of Vtach, and hypoxic respiratory failure.  10/10/23 transfer to [facility]. PICC placed 5/21. Pt required NG tube placement. PMhx: 10/01/23 Lt TKA, CAD, coronary stent, obesity, HTN, prostate CA, OSA    PT Comments  Pt received in supine, c/o fatigue and defers OOB mobility but agreeable to supine LLE exercises for ROM/strengthening. Pt needing Supervision for transfer to long sitting with use of bed rails but pt defers OOB due to fatigue. Good participation in LLE exercises for strengthening and ROM. Pt continues to benefit from PT services to progress toward functional mobility goals.    If plan is discharge home, recommend the following: A little help with walking and/or transfers;A little help with bathing/dressing/bathroom;Assistance with cooking/housework;Assist for transportation   Can travel by private vehicle        Equipment Recommendations  None recommended by PT    Recommendations for Other Services       Precautions / Restrictions Precautions Precautions: Fall;Knee;Other (comment) Precaution Booklet Issued: No (pt defers and states he has handout at home) Recall of Precautions/Restrictions: Intact Precaution/Restrictions Comments: NG tube, flexiseal, PICC Restrictions Weight Bearing Restrictions Per Provider Order: Yes LLE Weight Bearing Per Provider Order: Weight bearing as tolerated Other Position/Activity Restrictions: no pillow or bolster under bend of knee at rest, OK to have under ankle to promote L knee extension     Mobility  Bed Mobility Overal bed mobility: Needs Assistance Bed Mobility: Supine to Sit     Supine to sit: Supervision, Used rails, HOB elevated     General bed mobility comments: elevated HOB to  long sitting    Transfers Overall transfer level: Needs assistance                 General transfer comment: pt refused due to fatigue    Ambulation/Gait                   Stairs             Wheelchair Mobility     Tilt Bed    Modified Rankin (Stroke Patients Only)       Balance Overall balance assessment: Needs assistance Sitting-balance support: No upper extremity supported Sitting balance-Leahy Scale: Fair Sitting balance - Comments: long sitting in bed       Standing balance comment: defers standing today                            Communication Communication Communication: No apparent difficulties  Cognition Arousal: Alert Behavior During Therapy: WFL for tasks assessed/performed   PT - Cognitive impairments: Safety/Judgement                       PT - Cognition Comments: Pt reluctant to participate in OOB due to pain/fatigue, receptive to instruction and bed-level session only. Following commands: Intact      Cueing Cueing Techniques: Verbal cues  Exercises Total Joint Exercises Ankle Circles/Pumps: AROM, 10 reps, Supine, Both Quad Sets: AROM, Left, 5 reps, Supine Short Arc Quad: AROM, Left, 10 reps, Supine Heel Slides: AROM, Left, 10 reps, Supine Straight Leg Raises: AAROM, Left, 5 reps, Supine Knee Flexion: AROM, Left, 10 reps, Right, Seated (bed chair posture) Goniometric ROM:  (  AA needed to reach TKE with SAQ exercise but appears grossly 0 deg to 5 deg ext) Other Exercises Other Exercises: bed "crunches" x 5 reps to long sitting using bed rails    General Comments General comments (skin integrity, edema, etc.): 3L O2 [address]; reinforced keeping LLE extended at rest; LLE elevated/heel floated for comfort but bed is trended forward, RN notified in case it is a bed malfunction      Pertinent Vitals/Pain Pain Assessment Pain Assessment: Faces Faces Pain Scale: Hurts a little bit Pain Location: L knee wtih  HEP in supine; abdominal/back discomfort Pain Descriptors / Indicators: Discomfort, Grimacing, Sore Pain Intervention(s): Limited activity within patient's tolerance, Monitored during session, Repositioned    Home Living Family/patient expects to be discharged to:: Private residence Living Arrangements: Spouse/significant other Available Help at Discharge: Family;Available 24 hours/day Type of Home: House Home Access: Ramped entrance       Home Layout: One level Home Equipment: Agricultural consultant (2 wheels);Rollator (4 wheels);Cane - single point      Prior Function            PT Goals (current goals can now be found in the care plan section) Acute Rehab PT Goals Patient Stated Goal: go home PT Goal Formulation: With patient Time For Goal Achievement: 10/27/23 Progress towards PT goals: Progressing toward goals (slowly)    Frequency    Min 2X/week      PT Plan      Co-evaluation              AM-PAC PT "6 Clicks" Mobility   Outcome Measure  Help needed turning from your back to your side while in a flat bed without using bedrails?: A Little Help needed moving from lying on your back to sitting on the side of a flat bed without using bedrails?: A Little Help needed moving to and from a bed to a chair (including a wheelchair)?: A Little Help needed standing up from a chair using your arms (e.g., wheelchair or bedside chair)?: A Little Help needed to walk in hospital room?: A Little Help needed climbing 3-5 steps with a railing? : A Lot 6 Click Score: 17    End of Session Equipment Utilized During Treatment: Oxygen Activity Tolerance: Patient limited by fatigue Patient left: in bed;with call bell/phone within reach;with bed alarm set;Other (comment) (LLE elevated for comfort/heel floated) Nurse Communication: Mobility status;Precautions PT Visit Diagnosis: Difficulty in walking, not elsewhere classified (R26.2);Other abnormalities of gait and mobility (R26.89)      Time: 4098-1191 PT Time Calculation (min) (ACUTE ONLY): 13 min  Charges:    $Therapeutic Exercise: 8-22 mins PT General Charges $$ ACUTE PT VISIT: 1 Visit                     Vangie Henthorn P., PTA Acute Rehabilitation Services Secure Chat Preferred 9a-5:30pm Office: [phone]    Mariel Shope [facility] 10/15/2023, 6:14 PM

## 2023-10-15 NOTE — TOC Progression Note (Signed)
 Transition of Care [facility]) - Progression Note    Patient Details  Name: Chase Combs MRN: [id number] Date of Birth: 07-26-1945  Transition of Care [facility]) CM/SW Contact  Jonathan Neighbor, RN Phone Number: 10/15/2023, 3:05 PM  Clinical Narrative:     Not tolerating POs. Was going to [facility] for PT prior to admission per handoff.  TOC following.       Expected Discharge Plan and Services                                               Social Determinants of Health (SDOH) Interventions SDOH Screenings   Food Insecurity: Patient Declined (10/11/2023)  Housing: Patient Declined (10/11/2023)  Transportation Needs: Patient Declined (10/11/2023)  Utilities: Patient Declined (10/11/2023)  Social Connections: Patient Declined (10/11/2023)  Tobacco Use: Low Risk  (10/09/2023)    Readmission Risk Interventions     No data to display

## 2023-10-15 NOTE — Progress Notes (Signed)
  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  Patient noted to have distended abdomen, Tachypnic and Tachycardiac upon arrival to ED and he was hypoxic.  A CT abdomen/pelvis and CTA for Pulmonary emboli were done.  He was noted to have frequent PVCs and possible short runs of V Tach and Amiodarone  drip was started.  CT scan abd-no definitive obstruction and CTA no Pulmonary emboli.  Patient noted to have abdominal distension and hypoxia post op as well during several days of recovery with one day where he had some vomiting and PT was canceled.  Assessment and Plan:  Acute hypoxemic respiratory failure: Resolved Likely in the setting of hypoventilation from markedly distended abdomen.  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 - NG tube placed two days ago still having a lot of output, as well as liquid stools.  Patient has a fecal management system.   - Abdominal distention getting better, having more bowel sounds, Liver still shows quite distended bowel.   - GI following, advised to continue Relistor  and Reglan   - Patient receiving nutrition through TPN  - Correcting electrolyte abnormalities, hypokalemia replenished with 60 mEq of IV potassium with pharmacy.       CAD s/p CABG: Stable no symptoms, IV Lopressor  initiated Frequent PVCs and NSVT HTN: Blood pressure elevated - Patient n.p.o. unable to take p.o. hypertension I started the patient on IV scheduled Lopressor  q6h Once patient is able to tolerate p.o. can initiate p.o. beta-blocker.         Subjective: Patient was seen and examined. Denies any worsening of abdominal pain.  Patient has not stooled through the fecal management system.  Physical Exam: Vitals:   10/14/23 2102 10/15/23 0400 10/15/23 0753 10/15/23 1115  BP: (!) 117/55 137/85 (!) 149/82 (!) 149/89  Pulse: (!) 123 (!) 113 (!) 115 (!) 109  Resp: 18     Temp: 98.1 F (36.7 C) 98.9 F (37.2 C) 98.7 F (37.1 C) 97.7 F (36.5 C)  TempSrc: Oral Oral  Oral  SpO2: 93% 96% 91% 100%  Weight:       Awake, alert, oriented Breathing non labored Breath sounds clear, no wheezes On HHFNC Abdomen distended, improving, nontender, hypoactive bowel sounds Tachycardic, regular No peripheral edema Data Reviewed:    Family Communication: Patient is alert and oriented and family at bedside  Disposition: Status is: Inpatient Remains inpatient appropriate because: Still having ileus  Planned Discharge Destination: TBD    Time spent: 35 minutes  Author: Edwena Graham, MD 10/15/2023 11:36 AM  For on call review [email or web address].

## 2023-10-15 NOTE — Progress Notes (Signed)
 [facility] ROUNDING NOTE   Subjective: Patient was a little disoriented coming back from radiology.  Apparently pulled out his NG tube last night.  He said he was "burning up" but has been afebrile and was not warm to touch.  He denies abdominal pain, nausea/vomiting. Plain film this morning shows continued dilated small bowel, although somewhat improved, with numerous air-fluid levels.  Continues to have large volume stool output.   Objective: Vital signs in last 24 hours: Temp:  [97.8 F (36.6 C)-98.9 F (37.2 C)] 98.7 F (37.1 C) (05/23 0753) Pulse Rate:  [70-123] 115 (05/23 0753) Resp:  [17-31] 18 (05/22 2102) BP: (90-155)/(52-93) 149/82 (05/23 0753) SpO2:  [91 %-99 %] 91 % (05/23 0753) Last BM Date : 10/14/23 General: NAD, elderly white male Lungs:  CTA b/l, bilateral crackles Heart:  RRR, no m/r/g Abdomen:  Soft, NT, notably distended +BS Ext:  No c/c/e    Intake/Output from previous day: 05/22 0701 - 05/23 0700 In: 1432.4 [I.V.:317.5; NG/GT:50; IV Piggyback:1014.8] Out: 3492 [Urine:400; Emesis/NG output:1300; Stool:1792] Intake/Output this shift: No intake/output data recorded.   Lab Results: No results for input(s): "WBC", "HGB", "PLT", "MCV" in the last 72 hours. BMET Recent Labs    10/14/23 0323 10/14/23 1700 10/15/23 0611  NA 138 140 140  K 2.8* 3.3* 3.2*  CL 93* 96* 99  CO2 33* 34* 32  GLUCOSE 228* 201* 238*  BUN 36* 29* 22  CREATININE 0.84 0.70 0.68  CALCIUM  8.3* 8.3* 8.3*   LFT Recent Labs    10/14/23 0323 10/14/23 1700 10/15/23 0611  PROT 5.1*  --   --   ALBUMIN 2.1* 2.1* 2.1*  AST 25  --   --   ALT 34  --   --   ALKPHOS 71  --   --   BILITOT 0.9  --   --    PT/INR No results for input(s): "INR" in the last 72 hours.    Imaging/Other results: DG Abd 1 View Result Date: 10/14/2023 CLINICAL DATA:  NG tube EXAM: ABDOMEN - 1 VIEW COMPARISON:  10/13/2023 FINDINGS: Enteric tube tip over the mid gastric region, side-port in  the region of GE junction. Dilated air-filled bowel in the upper abdomen IMPRESSION: Enteric tube tip over the mid gastric region, side-port in the region of GE junction. Further advancement by about 5 to 10 cm is suggested for more optimal position Electronically Signed   By: Esmeralda Hedge M.D.   On: 10/14/2023 22:27   US  EKG SITE RITE Result Date: 10/13/2023 If Site Rite image not attached, placement could not be confirmed due to current cardiac rhythm.     Assessment and Plan:  78 year old male status post left total knee replacement on 5/9, admitted with nausea, vomiting, obstipation as well as tachypnea and hypoxia with frequent PVCs and short runs of V. tach requiring amiodarone  drip.  Abdominal imaging has shown persistent ileus, involving both small and large intestine.  He has been treated with daily Relistor , Reglan  10 mg IV every 6, and received 1 dose of neostigmine on May 20. He has shown significant improvement in his abdominal distention in the past several days, and today has a minimally distended abdomen with positive bowel sounds. He does seem a little confused today, and is complaining of subjective fevers.  Query whether he may have an underlying infection. C diff tested earlier this admission and was negative.  Prolonged postop ileus secondary to immobility and narcotics Abdominal exam with continued to improvement in terms  of abdominal distention, and now with active bowel sounds today - Clamp NG tube - Continue sips/ice chips, considering advancing to clear liquids today if tolerating with NGT clamped - Continue Relistor  for now; hopefully discontinue tomorrow - Continue Reglan  for now; hopefully discontinue tomorrow - Continue TPN - Continue electrolyte replacement per protocol  Confusion, subjective fevers - Possibly related to prolonged hospitalization and insomnia - Obtain CBC to ensure no new leukocytosis - Continue to monitor for fevers - Consider infectious  workup (cultures, chest x-ray, repeat C diff) based on clinical status today      Elois Hair, MD  10/15/2023, 9:18 AM [facility]

## 2023-10-15 NOTE — Progress Notes (Addendum)
 TPN Consult - PM Follow Up  Unable to hang TPN since unable to use PICC - D10W at 60 ml/hr Cancel PM labs given difficulty with collection KCL x 6 runs F/u AM labs  Dnasia Gauna D. Marikay Show, PharmD, BCPS, BCCCP 10/15/2023, 9:26 PM

## 2023-10-15 NOTE — Progress Notes (Signed)
 PHARMACY - TOTAL PARENTERAL NUTRITION CONSULT NOTE   Indication: Prolonged ileus  Patient Measurements: Weight: 96.7 kg (213 lb 3 oz)  Ht 69 in Body mass index is 31.48 kg/m. Usual Weight: 98.3 kg  Assessment:  [name] who presents with abdominal distention in the setting of prolonged ileus. Patient had surgery on his knee on May 9 and was taking oxycodone  at home for pain. Endorses that intake has declined significantly since his procedure. Prior to procedure he was eating 3 meals a day which consisted of coffee and toast for breakfast, a veggie platter and meat for lunch, and a dinner prepared by his wife. Does not remember intake after surgery very well but states he was eating very little as nothing was appealing to him.   Has been receiving Relistor , Reglan , and neostigmine in combination with SMOG enemas, however has persistent bowel dilation. No bowel sounds present on physical exam. Pharmacy has been consulted to initiate TPN in setting of prolonged ileus and minimal intake.   Glucose / Insulin : CBGs elevated, 179-216, 22 units of SSI utilized past 24 hours Electrolytes: K 3.2. (goal >4 with ileus). Pt requiring lots of K - likely due to losses. Others wnl. Renal: Scr <1 Hepatic: LFTs WNL Intake / Output; MIVF: 1892 mL stool output (diarrhea), 2150 ml NG output. UOP not recorded accurately. GI Imaging: KUB 5/21 with progressive adynamic ileus GI Surgeries / Procedures: N/A  Central access: PICC (placed 5/21) TPN start date: 5/21  Nutritional Goals: Goal TPN rate is 90 mL/hr (provides 121 g of protein and 2200 kcals per day)  RD Assessment: Estimated Needs Total Energy Estimated Needs: 2200-2400 Total Protein Estimated Needs: 110-130 grams Total Fluid Estimated Needs: >/= 2 L  Current Nutrition:  Clear liquids and TPN  Plan:  Increase TPN to 60 mL/hr at 1800 -- advancing to goal slowly with low potassium Electrolytes in TPN: Na 50mEq/L, increase K to 75 mEq/L, Ca  79mEq/L, Mg 51mEq/L, and Phos 15mmol/L. Cl:Ac -- maximize chloride Add standard MVI and trace elements to TPN Continue to add thiamine to TPN daily for 7 total days (5/27 stop date) Will add regular insulin  10 units daily to TPN Continue Resistant q4h SSI and adjust as needed  Monitor TPN labs on Mon/Thur - twice daily today and then daily until TPN at goal rate  Enrigue Harvard, PharmD, BCPS Please see amion for complete clinical pharmacist phone list 10/15/2023 7:33 AM

## 2023-10-16 ENCOUNTER — Inpatient Hospital Stay (HOSPITAL_COMMUNITY)

## 2023-10-16 ENCOUNTER — Other Ambulatory Visit: Payer: Self-pay

## 2023-10-16 DIAGNOSIS — K567 Ileus, unspecified: Secondary | ICD-10-CM | POA: Diagnosis not present

## 2023-10-16 DIAGNOSIS — R0902 Hypoxemia: Secondary | ICD-10-CM | POA: Diagnosis not present

## 2023-10-16 DIAGNOSIS — R509 Fever, unspecified: Secondary | ICD-10-CM

## 2023-10-16 DIAGNOSIS — R140 Abdominal distension (gaseous): Secondary | ICD-10-CM | POA: Diagnosis not present

## 2023-10-16 DIAGNOSIS — N179 Acute kidney failure, unspecified: Secondary | ICD-10-CM

## 2023-10-16 DIAGNOSIS — E876 Hypokalemia: Secondary | ICD-10-CM

## 2023-10-16 DIAGNOSIS — D72829 Elevated white blood cell count, unspecified: Secondary | ICD-10-CM

## 2023-10-16 DIAGNOSIS — K9189 Other postprocedural complications and disorders of digestive system: Secondary | ICD-10-CM | POA: Diagnosis not present

## 2023-10-16 LAB — CBC
HCT: 30.3 % — ABNORMAL LOW (ref 39.0–52.0)
Hemoglobin: 10.1 g/dL — ABNORMAL LOW (ref 13.0–17.0)
MCH: 30 pg (ref 26.0–34.0)
MCHC: 33.3 g/dL (ref 30.0–36.0)
MCV: 89.9 fL (ref 80.0–100.0)
Platelets: 473 10*3/uL — ABNORMAL HIGH (ref 150–400)
RBC: 3.37 MIL/uL — ABNORMAL LOW (ref 4.22–5.81)
RDW: 15 % (ref 11.5–15.5)
WBC: 16.5 10*3/uL — ABNORMAL HIGH (ref 4.0–10.5)
nRBC: 0.5 % — ABNORMAL HIGH (ref 0.0–0.2)

## 2023-10-16 LAB — C DIFFICILE (CDIFF) QUICK SCRN (NO PCR REFLEX)
C Diff antigen: NEGATIVE
C Diff interpretation: NOT DETECTED
C Diff toxin: NEGATIVE

## 2023-10-16 LAB — URINALYSIS, ROUTINE W REFLEX MICROSCOPIC
Bacteria, UA: NONE SEEN
Bilirubin Urine: NEGATIVE
Glucose, UA: NEGATIVE mg/dL
Hgb urine dipstick: NEGATIVE
Ketones, ur: NEGATIVE mg/dL
Leukocytes,Ua: NEGATIVE
Nitrite: NEGATIVE
Protein, ur: 30 mg/dL — AB
Specific Gravity, Urine: 1.024 (ref 1.005–1.030)
pH: 5 (ref 5.0–8.0)

## 2023-10-16 LAB — BASIC METABOLIC PANEL WITH GFR
Anion gap: 10 (ref 5–15)
BUN: 21 mg/dL (ref 8–23)
CO2: 31 mmol/L (ref 22–32)
Calcium: 8.1 mg/dL — ABNORMAL LOW (ref 8.9–10.3)
Chloride: 97 mmol/L — ABNORMAL LOW (ref 98–111)
Creatinine, Ser: 0.99 mg/dL (ref 0.61–1.24)
GFR, Estimated: >60 mL/min (ref >60)
Glucose, Bld: 165 mg/dL — ABNORMAL HIGH (ref 70–99)
Potassium: 3.2 mmol/L — ABNORMAL LOW (ref 3.5–5.1)
Sodium: 138 mmol/L (ref 135–145)

## 2023-10-16 LAB — MAGNESIUM: Magnesium: 2 mg/dL (ref 1.7–2.4)

## 2023-10-16 LAB — GLUCOSE, CAPILLARY
Glucose-Capillary: 111 mg/dL — ABNORMAL HIGH (ref 70–99)
Glucose-Capillary: 125 mg/dL — ABNORMAL HIGH (ref 70–99)
Glucose-Capillary: 158 mg/dL — ABNORMAL HIGH (ref 70–99)
Glucose-Capillary: 167 mg/dL — ABNORMAL HIGH (ref 70–99)
Glucose-Capillary: 189 mg/dL — ABNORMAL HIGH (ref 70–99)
Glucose-Capillary: 203 mg/dL — ABNORMAL HIGH (ref 70–99)

## 2023-10-16 LAB — PHOSPHORUS: Phosphorus: 2.9 mg/dL (ref 2.5–4.6)

## 2023-10-16 MED ORDER — POTASSIUM CHLORIDE 10 MEQ/50ML IV SOLN
10.0000 meq | INTRAVENOUS | Status: AC
  Administered 2023-10-16 (×6): 10 meq via INTRAVENOUS
  Filled 2023-10-16 (×6): qty 50

## 2023-10-16 MED ORDER — ACETAMINOPHEN 325 MG PO TABS
650.0000 mg | ORAL_TABLET | Freq: Four times a day (QID) | ORAL | Status: DC | PRN
  Administered 2023-10-16 – 2023-10-26 (×5): 650 mg via ORAL
  Filled 2023-10-16 (×6): qty 2

## 2023-10-16 MED ORDER — TRAVASOL 10 % IV SOLN
INTRAVENOUS | Status: AC
  Filled 2023-10-16: qty 806.4

## 2023-10-16 NOTE — Plan of Care (Signed)
  Problem: Clinical Measurements: Goal: Ability to maintain clinical measurements within normal limits will improve Outcome: Progressing Goal: Will remain free from infection Outcome: Progressing Goal: Diagnostic test results will improve Outcome: Progressing Goal: Respiratory complications will improve Outcome: Progressing Goal: Cardiovascular complication will be avoided Outcome: Progressing   Problem: Coping: Goal: Level of anxiety will decrease Outcome: Progressing   Problem: Elimination: Goal: Will not experience complications related to urinary retention Outcome: Progressing   Problem: Pain Managment: Goal: General experience of comfort will improve and/or be controlled Outcome: Progressing   Problem: Safety: Goal: Ability to remain free from injury will improve Outcome: Progressing   Problem: Skin Integrity: Goal: Risk for impaired skin integrity will decrease Outcome: Progressing

## 2023-10-16 NOTE — Plan of Care (Signed)
   Problem: Education: Goal: Knowledge of General Education information will improve Description Including pain rating scale, medication(s)/side effects and non-pharmacologic comfort measures Outcome: Progressing   Problem: Health Behavior/Discharge Planning: Goal: Ability to manage health-related needs will improve Outcome: Progressing

## 2023-10-16 NOTE — Progress Notes (Signed)
 PROGRESS NOTE    Chase Combs  ZOX:[id number] DOB: 1945-08-26 DOA: 10/09/2023 PCP: Ilsa Maltese, PA   Brief Narrative:    78 y/o make with PMH significant for Left TKR on 10/01/2023 for osteoarthritis, CAD S/p MI s/p coronary stent x 2, who presented to [facility] with c/o feeling "unwell" x 1 week since his TKR.  Patient noted to have distended abdomen, Tachypnic and Tachycardiac upon arrival to ED and he was hypoxic.  A CT abdomen/pelvis and CTA for Pulmonary emboli were done.  He was noted to have frequent PVCs and possible short runs of V Tach and Amiodarone  drip was started.  CT scan abd-no definitive obstruction and CTA no Pulmonary emboli.  Patient noted to have abdominal distension and hypoxia post op as well during several days of recovery with one day where he had some vomiting and PT was canceled.  He was admitted for acute hypoxemic respiratory failure secondary type of ventilation due to markedly distended abdomen related to opiate induced ileus.  PICC line placed 5/24 and patient noted to have fever.  Assessment & Plan:   Principal Problem:   Hypoxia  Assessment and Plan:   Acute hypoxemic respiratory failure: Resolved Likely in the setting of hypoventilation from markedly distended abdomen.  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 - NG tube placed two days ago still having a lot of output, as well as liquid stools.  Patient has a fecal management system.   - Abdominal distention getting better, having more bowel sounds, Liver still shows quite distended bowel.   - GI following, advised to continue Relistor  and Reglan   - Patient receiving nutrition through TPN, PICC line replaced today  Hypokalemia -Replete and reevaluate in a.m.  Fever -Chest x-ray with atelectasis, but no infiltrate -C. difficile quick screen per GI -Plan to  check urine analysis     CAD s/p CABG: Stable no symptoms, IV Lopressor  initiated Frequent PVCs and NSVT HTN: Blood pressure elevated - Patient n.p.o. unable to take p.o. hypertension I started the patient on IV scheduled Lopressor  q6h Once patient is able to tolerate p.o. can initiate p.o. beta-blocker.   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   Consultants:  GI  Procedures:  None  Antimicrobials:  Anti-infectives (From admission, onward)    Start     Dose/Rate Route Frequency Ordered Stop   10/10/23 0600  piperacillin -tazobactam (ZOSYN ) IVPB 3.375 g  Status:  Discontinued        3.375 g 12.5 mL/hr over 240 Minutes Intravenous Every 8 hours 10/10/23 0445 10/11/23 0951   10/09/23 2345  vancomycin  (VANCOCIN ) 500 mg in sodium chloride  0.9 % 100 mL IVPB       Placed in "And" Linked Group   500 mg 100 mL/hr over 60 Minutes Intravenous  Once 10/09/23 2231 10/10/23 0103   10/09/23 2245  vancomycin  (VANCOCIN ) IVPB 1000 mg/200 mL premix       Placed in "And" Linked Group   1,000 mg 200 mL/hr over 60 Minutes Intravenous  Once 10/09/23 2231 10/09/23 2359   10/09/23 2215  vancomycin  (VANCOCIN ) IVPB 1000 mg/200 mL premix  Status:  Discontinued        1,000 mg 200 mL/hr over 60 Minutes Intravenous  Once 10/09/23 2212 10/09/23 2227   10/09/23 2215  piperacillin -tazobactam (ZOSYN ) IVPB 3.375 g        3.375 g 100 mL/hr over 30 Minutes Intravenous  Once 10/09/23 2212 10/09/23 2330       Subjective: Patient seen and evaluated today with no new acute complaints or concerns. No acute concerns or events noted overnight.  He is noted to have fever of 102.7 Fahrenheit this morning and PICC line has been placed this morning.  He continues to have intermittent abdominal pain along with some vomiting last  night.  Objective: Vitals:   10/16/23 0756 10/16/23 0952 10/16/23 1209 10/16/23 1212  BP: 125/83 (!) 100/57 (!) 116/55 (!) 116/55  Pulse: 64 (!) 58 (!) 104   Resp: (!) 21 (!) 24 (!) 22   Temp: (!) 102.7 F (39.3 C) (!) 101.1 F (38.4 C) 99 F (37.2 C) 99 F (37.2 C)  TempSrc:  Oral Oral   SpO2: 93% 98% 97%   Weight:        Intake/Output Summary (Last 24 hours) at 10/16/2023 1310 Last data filed at 10/16/2023 4098 Gross per 24 hour  Intake 10 ml  Output 700 ml  Net -690 ml   Filed Weights   10/13/23 0712 10/14/23 0500 10/16/23 0500  Weight: 98.3 kg 96.7 kg 95.3 kg    Examination:  General exam: Appears mildly agitated/uncomfortable Respiratory system: Decreased to auscultation, nasal cannula present Cardiovascular system: S1 & S2 heard, RRR.  Gastrointestinal system: Abdomen is distended and tympanic.  NG tube clamped. Central nervous system: Alert and awake Extremities: No edema Skin: No significant lesions noted Psychiatry: Flat affect.    Data Reviewed: I have personally reviewed following labs and imaging studies  CBC: Recent Labs  Lab 10/09/23 2200 10/10/23 0008 10/10/23 0537 10/11/23 0847 10/15/23 2149 10/16/23 1054  WBC 4.5  --   --  3.5* 10.2 16.5*  NEUTROABS 3.2  --   --   --   --   --   HGB 12.0* 11.2* 10.9* 10.0* 10.2* 10.1*  HCT 35.3* 33.0* 32.0* 30.5* 31.2* 30.3*  MCV 88.0  --   --  90.5 91.5 89.9  PLT 551*  --   --  473* 520* 473*   Basic Metabolic Panel: Recent Labs  Lab 10/13/23 0159 10/14/23 0323 10/14/23 1700 10/15/23 0611 10/16/23 1054  NA 136 138 140 140 138  K 3.8 2.8* 3.3* 3.2* 3.2*  CL 94* 93* 96* 99 97*  CO2 31 33* 34* 32 31  GLUCOSE 125* 228* 201* 238* 165*  BUN 44* 36* 29* 22 21  CREATININE 0.84 0.84 0.70 0.68 0.99  CALCIUM  8.7* 8.3* 8.3* 8.3* 8.1*  MG 2.3 2.4 2.4 2.5* 2.0  PHOS 2.5 2.3* 2.8 2.7 2.9   GFR: Estimated Creatinine Clearance: 73.4 mL/min (by C-G formula based on SCr of 0.99 mg/dL). Liver Function  Tests: Recent Labs  Lab 10/09/23 2200 10/10/23 0619 10/14/23 0323 10/14/23 1700 10/15/23 0611  AST 55* 36 25  --   --   ALT 109* 73* 34  --   --   ALKPHOS 195* 116 71  --   --   BILITOT 1.8* 1.5* 0.9  --   --   PROT 7.5 6.0* 5.1*  --   --   ALBUMIN 3.8 2.7* 2.1* 2.1* 2.1*   No results for input(s): "LIPASE", "AMYLASE" in the last 168 hours. No results for input(s): "AMMONIA" in the last 168 hours. Coagulation Profile: Recent Labs  Lab 10/09/23 2200  INR 1.2   Cardiac Enzymes: No results for input(s): "CKTOTAL", "CKMB", "CKMBINDEX", "TROPONINI" in the last 168 hours. BNP (last 3 results) No results for input(s): "PROBNP" in the last 8760 hours. HbA1C: No results for input(s): "HGBA1C" in the last 72 hours. CBG: Recent Labs  Lab 10/15/23 2128 10/16/23 0011 10/16/23 0517 10/16/23 0825 10/16/23 1232  GLUCAP 138* 158* 203* 111* 189*   Lipid Profile: No results for input(s): "CHOL", "HDL", "LDLCALC", "TRIG", "CHOLHDL", "LDLDIRECT" in the last 72 hours. Thyroid  Function Tests: No results for input(s): "TSH", "T4TOTAL", "FREET4", "T3FREE", "THYROIDAB" in the last 72 hours. Anemia Panel: No results for input(s): "VITAMINB12", "FOLATE", "FERRITIN", "TIBC", "IRON", "RETICCTPCT" in the last 72 hours. Sepsis Labs: Recent Labs  Lab 10/09/23 2142 10/09/23 2351 10/10/23 0619  PROCALCITON  --   --  0.76  LATICACIDVEN 2.1* 1.8  --     Recent Results (from the past 240 hours)  Culture, blood (Routine x 2)     Status: None   Collection Time: 10/09/23  9:42 PM   Specimen: BLOOD  Result Value Ref Range Status   Specimen Description   Final    BLOOD RIGHT ANTECUBITAL Performed at [facility], [address], [place], [place] [address]    Special Requests   Final    BOTTLES DRAWN AEROBIC AND ANAEROBIC Blood Culture adequate volume Performed at [facility], [address] [place], [place] [address]    Culture   Final    NO GROWTH  5 DAYS Performed at [facility], [address], [place], [place] [address]    Report Status 10/15/2023 FINAL  Final  Resp panel by RT-PCR (RSV, Flu A&B, Covid) Anterior Nasal Swab     Status: None   Collection Time: 10/09/23 10:07 PM   Specimen: Anterior Nasal Swab  Result Value Ref Range Status   SARS Coronavirus 2 by RT PCR NEGATIVE NEGATIVE Final    Comment: (NOTE) SARS-CoV-2 target nucleic acids are NOT DETECTED.  The SARS-CoV-2 RNA is generally detectable in upper respiratory specimens during the acute phase of infection. The lowest concentration of SARS-CoV-2 viral copies this assay can detect is 138 copies/mL. A negative result does not preclude SARS-Cov-2 infection and should not be used as the sole basis for treatment or other patient management decisions. A negative result may occur with  improper specimen collection/handling, submission of specimen other than nasopharyngeal swab, presence of viral mutation(s) within the areas targeted by this assay, and inadequate number of viral copies(<138 copies/mL). A negative result must be combined with clinical observations, patient history, and epidemiological information. The expected result is Negative.  Fact Sheet for Patients:  BloggerCourse.com  Fact Sheet for Healthcare Providers:  SeriousBroker.it  This test is no t yet approved or cleared by the [place]  FDA and  has been authorized for detection and/or diagnosis of SARS-CoV-2 by FDA under an Emergency Use Authorization (EUA). This EUA will remain  in effect (meaning this test can be used) for the duration of the COVID-19 declaration under Section 564(b)(1) of the Act, 21 U.S.C.section 360bbb-3(b)(1), unless the authorization is terminated  or revoked sooner.       Influenza A by PCR NEGATIVE NEGATIVE Final   Influenza B by PCR NEGATIVE NEGATIVE Final    Comment: (NOTE) The Xpert Xpress  SARS-CoV-2/FLU/RSV plus assay is intended as an aid in the diagnosis of influenza from Nasopharyngeal swab specimens and should not be used as a sole basis for treatment. Nasal washings and aspirates  are unacceptable for Xpert Xpress SARS-CoV-2/FLU/RSV testing.  Fact Sheet for Patients: BloggerCourse.com  Fact Sheet for Healthcare Providers: SeriousBroker.it  This test is not yet approved or cleared by the [place]  FDA and has been authorized for detection and/or diagnosis of SARS-CoV-2 by FDA under an Emergency Use Authorization (EUA). This EUA will remain in effect (meaning this test can be used) for the duration of the COVID-19 declaration under Section 564(b)(1) of the Act, 21 U.S.C. section 360bbb-3(b)(1), unless the authorization is terminated or revoked.     Resp Syncytial Virus by PCR NEGATIVE NEGATIVE Final    Comment: (NOTE) Fact Sheet for Patients: BloggerCourse.com  Fact Sheet for Healthcare Providers: SeriousBroker.it  This test is not yet approved or cleared by the [place]  FDA and has been authorized for detection and/or diagnosis of SARS-CoV-2 by FDA under an Emergency Use Authorization (EUA). This EUA will remain in effect (meaning this test can be used) for the duration of the COVID-19 declaration under Section 564(b)(1) of the Act, 21 U.S.C. section 360bbb-3(b)(1), unless the authorization is terminated or revoked.  Performed at [facility], [address] [place], [place] [address]   Culture, blood (Routine X 2) w Reflex to ID Panel     Status: None   Collection Time: 10/09/23 10:20 PM   Specimen: BLOOD LEFT WRIST  Result Value Ref Range Status   Specimen Description   Final    BLOOD LEFT WRIST Performed at [facility], [address], [place], [place] [address]    Special Requests   Final    BOTTLES DRAWN AEROBIC  AND ANAEROBIC Blood Culture adequate volume Performed at [facility], [address] [place], [place] [address]    Culture   Final    NO GROWTH 5 DAYS Performed at [facility], [address], [place], [place] [address]    Report Status 10/15/2023 FINAL  Final  MRSA Next Gen by PCR, Nasal     Status: None   Collection Time: 10/10/23  3:37 AM   Specimen: Nasal Mucosa; Nasal Swab  Result Value Ref Range Status   MRSA by PCR Next Gen NOT DETECTED NOT DETECTED Final    Comment: (NOTE) The GeneXpert MRSA Assay (FDA approved for NASAL specimens only), is one component of a comprehensive MRSA colonization surveillance program. It is not intended to diagnose MRSA infection nor to guide or monitor treatment for MRSA infections. Test performance is not FDA approved in patients less than 58 years old. Performed at [facility], [address], [place], [place] [address]   C Difficile Quick Screen w PCR reflex     Status: None   Collection Time: 10/10/23  6:41 AM   Specimen: STOOL  Result Value Ref Range Status   C Diff antigen NEGATIVE NEGATIVE Final   C Diff toxin NEGATIVE NEGATIVE Final   C Diff interpretation No C. difficile detected.  Final    Comment: Performed at [facility], [address], [place], [place] [address]  Gastrointestinal Panel by PCR , Stool     Status: None   Collection Time: 10/10/23  6:41 AM   Specimen: STOOL  Result Value Ref Range Status   Campylobacter species NOT DETECTED NOT DETECTED Final   Plesimonas shigelloides NOT DETECTED NOT DETECTED Final   Salmonella species NOT DETECTED NOT DETECTED Final   Yersinia enterocolitica NOT DETECTED NOT DETECTED Final   Vibrio species NOT DETECTED NOT DETECTED Final   Vibrio  cholerae NOT DETECTED NOT DETECTED Final   Enteroaggregative E coli (EAEC) NOT DETECTED NOT DETECTED Final   Enteropathogenic E coli (EPEC) NOT DETECTED NOT DETECTED Final   Enterotoxigenic E coli (ETEC) NOT  DETECTED NOT DETECTED Final   Shiga like toxin producing E coli (STEC) NOT DETECTED NOT DETECTED Final   Shigella/Enteroinvasive E coli (EIEC) NOT DETECTED NOT DETECTED Final   Cryptosporidium NOT DETECTED NOT DETECTED Final   Cyclospora cayetanensis NOT DETECTED NOT DETECTED Final   Entamoeba histolytica NOT DETECTED NOT DETECTED Final   Giardia lamblia NOT DETECTED NOT DETECTED Final   Adenovirus F40/41 NOT DETECTED NOT DETECTED Final   Astrovirus NOT DETECTED NOT DETECTED Final   Norovirus GI/GII NOT DETECTED NOT DETECTED Final   Rotavirus A NOT DETECTED NOT DETECTED Final   Sapovirus (I, II, IV, and V) NOT DETECTED NOT DETECTED Final    Comment: Performed at [facility], [address], [place], [place] [address]         Radiology Studies: DG CHEST PORT 1 VIEW Result Date: 10/16/2023 CLINICAL DATA:  Fever. EXAM: PORTABLE CHEST 1 VIEW COMPARISON:  Chest radiograph yesterday FINDINGS: Stable positioning of left upper extremity PICC which is looped in the region of the SVC azygous confluence. Enteric tube remains in place. Stable heart size and mediastinal contours. Stable appearance of bibasilar atelectasis. No progressive airspace disease. No pneumothorax. Normal pulmonary vasculature. IMPRESSION: 1. Stable positioning of left upper extremity PICC which is looped in the region of the SVC azygous confluence. Repositioning is again recommended. 2. Stable bibasilar atelectasis. Electronically Signed   By: Chadwick Colonel M.D.   On: 10/16/2023 11:31   US  EKG SITE RITE Result Date: 10/16/2023 If Site Rite image not attached, placement could not be confirmed due to current cardiac rhythm.  DG Chest Port 1 View Result Date: 10/15/2023 CLINICAL DATA:  PICC placement EXAM: PORTABLE CHEST 1 VIEW COMPARISON:  X-ray 10/15/2023 FINDINGS: As seen on the previous the left-sided PICC has a tip curled as the catheter reaches the expected location of the SVC azygous region. Recommend  repositioning. Enteric tube in place with tip extending beneath the diaphragm. Underinflation with persistent basilar bandlike opacities. Atelectasis is favored. No pneumothorax, effusion or edema. Stable cardiopericardial silhouette. IMPRESSION: Underinflation with basilar atelectasis.  Enteric tube. Stable appearance to the left-sided PICC with curving of the tip of the catheter. Recommend repositioning as previously stated. Electronically Signed   By: Adrianna Horde M.D.   On: 10/15/2023 20:35   DG CHEST PORT 1 VIEW Result Date: 10/15/2023 CLINICAL DATA:  PICC in place. EXAM: PORTABLE CHEST 1 VIEW COMPARISON:  Chest CTA 10/10/2023 FINDINGS: Left upper extremity PICC in place, there is a loop in the distal catheter with tip in the region of the upper SVC directed superolaterally. Recommend repositioning. The heart is upper normal in size. Aortic atherosclerosis. Subsegmental atelectasis/scarring in both lung bases. No pulmonary edema. No pleural fluid or pneumothorax. IMPRESSION: 1. Left upper extremity PICC in place. There is a loop in the distal catheter with tip in the region of the upper SVC directed superolaterally. Recommend repositioning. 2. Subsegmental atelectasis/scarring in both lung bases. Electronically Signed   By: Chadwick Colonel M.D.   On: 10/15/2023 19:18   DG Abd 2 Views Result Date: 10/15/2023 CLINICAL DATA:  Ileus.  Abdominal distension. EXAM: ABDOMEN - 2 VIEW COMPARISON:  Most recent radiograph yesterday, CT 10/09/2023 FINDINGS: Unchanged diffuse gaseous small bowel distension centrally with air-fluid levels on the upright view. Enteric tube remains  in place with tip and side-port below the diaphragm. Small amount of gas is seen in the colon without significant formed stool. IMPRESSION: Unchanged diffuse gaseous small bowel distension, likely ileus without obstruction on prior CT. Electronically Signed   By: Chadwick Colonel M.D.   On: 10/15/2023 11:17   DG Abd 1 View Result Date:  10/14/2023 CLINICAL DATA:  NG tube EXAM: ABDOMEN - 1 VIEW COMPARISON:  10/13/2023 FINDINGS: Enteric tube tip over the mid gastric region, side-port in the region of GE junction. Dilated air-filled bowel in the upper abdomen IMPRESSION: Enteric tube tip over the mid gastric region, side-port in the region of GE junction. Further advancement by about 5 to 10 cm is suggested for more optimal position Electronically Signed   By: Esmeralda Hedge M.D.   On: 10/14/2023 22:27        Scheduled Meds:  Chlorhexidine  Gluconate Cloth  6 each Topical Daily   heparin   5,000 Units Subcutaneous Q8H   insulin  aspart  0-20 Units Subcutaneous Q4H   lidocaine   1 patch Transdermal Q24H   metoprolol  tartrate  5 mg Intravenous Q6H   ondansetron  (ZOFRAN ) IV  4 mg Intravenous Once   sodium chloride  flush  10-40 mL Intracatheter Q12H   traMADol   50 mg Oral Once   Continuous Infusions:  potassium chloride      TPN ADULT (ION) 60 mL/hr at 10/16/23 0939   TPN ADULT (ION)       LOS: 6 days    Time spent: 55 minutes    Fayetta Sorenson Loran [place], DO [facility]  If 7PM-7AM, please contact night-coverage [email or web address] 10/16/2023, 1:10 PM

## 2023-10-16 NOTE — Progress Notes (Signed)
 Patient ID: Chase Combs, male   DOB: April 24, 1946, 78 y.o.   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  Tmax 102.7 this a.m.  No new labs today-pending Chest x-ray -stable bibasilar atelectasis, PICC looped in the region of the SVC azygous confluence repositioning again recommended  Patient continues to complain of mild abdominal pain off and on, nurse reports Flexi-Seal was leaking this morning had a lot of stool in the bed Patient had some vomiting last night NG remains clamped    Objective   Vital signs in last 24 hours: Temp:  [97.5 F (36.4 C)-102.7 F (39.3 C)] 101.1 F (38.4 C) (05/24 0952) Pulse Rate:  [53-129] 58 (05/24 0952) Resp:  [18-24] 24 (05/24 0952) BP: (100-148)/(57-98) 100/57 (05/24 0952) SpO2:  [93 %-98 %] 98 % (05/24 0952) Weight:  [95.3 kg] 95.3 kg (05/24 0500) Last BM Date : 10/15/23 General:    Elderly white male in NAD, uncomfortable appearing Heart:  Regular rate and rhythm; no murmurs Lungs: Respirations even and unlabored, decreased breath sounds at the bases Abdomen: Remains distended and tympanitic he does have a few bowel sounds no focal tenderness Extremities:  Without edema.  Left knee fresh incisional scar Neurologic:  Alert and oriented,  grossly normal neurologically. Psych:  Cooperative. Normal mood and affect.  Intake/Output from previous day: 05/23 0701 - 05/24 0700 In: -  Out: 700 [Emesis/NG output:200; Stool:500] Intake/Output this shift: Total I/O In: 10 [I.V.:10] Out: -   Lab Results: Recent Labs    10/15/23 2149  WBC 10.2  HGB 10.2*  HCT 31.2*  PLT 520*   BMET Recent Labs    10/14/23 0323 10/14/23 1700 10/15/23 0611  NA 138 140 140  K 2.8* 3.3* 3.2*  CL 93* 96* 99  CO2 33* 34* 32  GLUCOSE 228* 201* 238*  BUN 36* 29* 22  CREATININE 0.84 0.70 0.68  CALCIUM  8.3* 8.3* 8.3*   LFT Recent Labs     10/14/23 0323 10/14/23 1700 10/15/23 0611  PROT 5.1*  --   --   ALBUMIN 2.1*   < > 2.1*  AST 25  --   --   ALT 34  --   --   ALKPHOS 71  --   --   BILITOT 0.9  --   --    < > = values in this interval not displayed.   PT/INR No results for input(s): "LABPROT", "INR" in the last 72 hours.  Studies/Results: US  EKG SITE RITE Result Date: 10/16/2023 If Site Rite image not attached, placement could not be confirmed due to current cardiac rhythm.  DG Chest Port 1 View Result Date: 10/15/2023 CLINICAL DATA:  PICC placement EXAM: PORTABLE CHEST 1 VIEW COMPARISON:  X-ray 10/15/2023 FINDINGS: As seen on the previous the left-sided PICC has a tip curled as the catheter reaches the expected location of the SVC azygous region. Recommend repositioning. Enteric tube in place with tip extending beneath the diaphragm. Underinflation with persistent basilar bandlike opacities. Atelectasis is favored. No pneumothorax, effusion or edema. Stable cardiopericardial silhouette. IMPRESSION: Underinflation with basilar atelectasis.  Enteric tube. Stable appearance to the left-sided PICC with curving of the tip of the catheter. Recommend repositioning as previously stated. Electronically Signed   By: Adrianna Horde M.D.   On: 10/15/2023 20:35   DG CHEST PORT 1 VIEW Result Date: 10/15/2023 CLINICAL  DATA:  PICC in place. EXAM: PORTABLE CHEST 1 VIEW COMPARISON:  Chest CTA 10/10/2023 FINDINGS: Left upper extremity PICC in place, there is a loop in the distal catheter with tip in the region of the upper SVC directed superolaterally. Recommend repositioning. The heart is upper normal in size. Aortic atherosclerosis. Subsegmental atelectasis/scarring in both lung bases. No pulmonary edema. No pleural fluid or pneumothorax. IMPRESSION: 1. Left upper extremity PICC in place. There is a loop in the distal catheter with tip in the region of the upper SVC directed superolaterally. Recommend repositioning. 2. Subsegmental  atelectasis/scarring in both lung bases. Electronically Signed   By: Chadwick Colonel M.D.   On: 10/15/2023 19:18   DG Abd 2 Views Result Date: 10/15/2023 CLINICAL DATA:  Ileus.  Abdominal distension. EXAM: ABDOMEN - 2 VIEW COMPARISON:  Most recent radiograph yesterday, CT 10/09/2023 FINDINGS: Unchanged diffuse gaseous small bowel distension centrally with air-fluid levels on the upright view. Enteric tube remains in place with tip and side-port below the diaphragm. Small amount of gas is seen in the colon without significant formed stool. IMPRESSION: Unchanged diffuse gaseous small bowel distension, likely ileus without obstruction on prior CT. Electronically Signed   By: Chadwick Colonel M.D.   On: 10/15/2023 11:17   DG Abd 1 View Result Date: 10/14/2023 CLINICAL DATA:  NG tube EXAM: ABDOMEN - 1 VIEW COMPARISON:  10/13/2023 FINDINGS: Enteric tube tip over the mid gastric region, side-port in the region of GE junction. Dilated air-filled bowel in the upper abdomen IMPRESSION: Enteric tube tip over the mid gastric region, side-port in the region of GE junction. Further advancement by about 5 to 10 cm is suggested for more optimal position Electronically Signed   By: Esmeralda Hedge M.D.   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 Difficile-have added C. difficile quick screen  #3 hypokalemia continue to aggressively replace #3 acute kidney injury #4 malnutrition-receiving TPN  Plan; repeat  abdominal film portable-if any increasing gaseous distention on films will need to reconnect NG to low intermittent suction Stool for C. difficile quick screen Leave Flexi-Seal in place Continue IV Reglan  Hold on further Relistor   today      Principal Problem:   Hypoxia     LOS: 6 days   Lakima Dona EsterwoodPA-C  10/16/2023, 11:29 AM

## 2023-10-16 NOTE — Progress Notes (Signed)
 PHARMACY - TOTAL PARENTERAL NUTRITION CONSULT NOTE   Indication: Prolonged ileus  Patient Measurements: Weight: 95.3 kg (210 lb)  Ht 69 in Body mass index is 31.01 kg/m. Usual Weight: 98.3 kg  Assessment:  [name] who presents with abdominal distention in the setting of prolonged ileus. Patient had surgery on his knee on May 9 and was taking oxycodone  at home for pain. Endorses that intake has declined significantly since his procedure. Prior to procedure he was eating 3 meals a day which consisted of coffee and toast for breakfast, a veggie platter and meat for lunch, and a dinner prepared by his wife. Does not remember intake after surgery very well but states he was eating very little as nothing was appealing to him.   Has been receiving Relistor , Reglan , and neostigmine in combination with SMOG enemas, however has persistent bowel dilation. No bowel sounds present on physical exam. Pharmacy has been consulted to initiate TPN in setting of prolonged ileus and minimal intake.   5/23 pm - unable to use PICC due to placement. TPN not given, D10 @ 31ml/hr overnight. 5/24 - New PICC placed and TPN started at 1000.  Glucose / Insulin : CBGs 111-189 (noted off TPN last night, D10 only). 28 units of SSI utilized past 24 hours Electrolytes: K remains 3.2 (goal >4 with ileus). Pt requiring lots of K - likely due to losses. Others wnl. Renal: Scr <1, BUN wnl Hepatic: LFTs WNL Intake / Output; MIVF: 700 mL stool output (diarrhea), 600 ml NG output. UOP not recorded accurately. GI Imaging:  KUB 5/21 with progressive adynamic ileus Xray 5/23 with unchanged diffuse gaseous small bowel distension, likely ileus  GI Surgeries / Procedures: N/A  Central access: PICC (placed 5/21), replaced 5/24 TPN start date: 5/21  Nutritional Goals: Goal TPN rate is 90 mL/hr (provides 121 g of protein and 2200 kcals per day)  RD Assessment: Estimated Needs Total Energy Estimated Needs: 2200-2400 Total Protein  Estimated Needs: 110-130 grams Total Fluid Estimated Needs: >/= 2 L  Current Nutrition:  Clear liquids and TPN  Plan:  Yesterday's TPN was hung this a.m. ~1000 post PICC placement. D/c D10 now. Continue TPN at 60 mL/hr at 1800. Will not advance to goal rate today due to PICC issue overnight. Electrolytes in TPN: Na 70mEq/L, K to 75 mEq/L, Ca 89mEq/L, Mg 43mEq/L, and Phos 15mmol/L. Cl:Ac -- maximize chloride Potassium chloride  10mEq/50ml q1h x 6 Add standard MVI and trace elements to TPN Continue to add thiamine to TPN daily for 7 total days (5/27 stop date) Continue regular insulin  10 units daily in TPN Continue Resistant q4h SSI and adjust as needed  Monitor TPN labs on Mon/Thur - daily until TPN at goal rate  Enrigue Harvard, PharmD, BCPS Please see amion for complete clinical pharmacist phone list 10/16/2023 12:36 PM

## 2023-10-16 NOTE — Progress Notes (Signed)
 PT Cancellation Note  Patient Details Name: Chase Combs MRN: [id number] DOB: 09-15-1945   Cancelled Treatment:    Reason Eval/Treat Not Completed: Patient at procedure or test/unavailable;Other (comment). Attempted in AM and pt getting PICC line placed. In PM went in and noted NG tube position precarious and nursing notified. Will continue attempts.   [name] [facility] 10/16/2023, 4:14 PM Angelina Kempf PT Acute [employer] [phone]

## 2023-10-16 NOTE — Progress Notes (Signed)
 Peripherally Inserted Central Catheter Placement  The IV Nurse has discussed with the patient and/or persons authorized to consent for the patient, the purpose of this procedure and the potential benefits and risks involved with this procedure.  The benefits include less needle sticks, lab draws from the catheter, and the patient may be discharged home with the catheter. Risks include, but not limited to, infection, bleeding, blood clot (thrombus formation), and puncture of an artery; nerve damage and irregular heartbeat and possibility to perform a PICC exchange if needed/ordered by physician.  Alternatives to this procedure were also discussed.  Bard Power PICC patient education guide, fact sheet on infection prevention and patient information card has been provided to patient /or left at bedside.    PICC Placement Documentation  PICC Double Lumen 10/16/23 Left Basilic 53 cm 1 cm (Active)  Indication for Insertion or Continuance of Line Administration of hyperosmolar/irritating solutions (i.e. TPN, Vancomycin , etc.) 10/16/23 0935  Exposed Catheter (cm) 1 cm 10/16/23 0935  Site Assessment Clean, Dry, Intact 10/16/23 0935  Lumen #1 Status Flushed;Saline locked;Blood return noted 10/16/23 0935  Lumen #2 Status Flushed;Infusing;Blood return noted 10/16/23 0935  Dressing Type Transparent;Securing device 10/16/23 0935  Dressing Status Antimicrobial disc/dressing in place;Clean, Dry, Intact 10/16/23 0935  Line Care Connections checked and tightened 10/16/23 0935  Line Adjustment (NICU/IV Team Only) No 10/16/23 0935  Dressing Intervention Adhesive placed at insertion site (IV team only);New dressing 10/16/23 0935  Dressing Change Due 10/23/23 10/16/23 0935       Persais Ethridge Haywood Lisle 10/16/2023, 10:06 AM

## 2023-10-16 NOTE — Progress Notes (Signed)
 At bedside for PICC placement.  Pt nude and soiled.  Staff to notify once clean and ready for PICC placement.

## 2023-10-16 NOTE — Plan of Care (Signed)

## 2023-10-16 NOTE — Evaluation (Signed)
 Occupational Therapy Evaluation Patient Details Name: Chase Combs MRN: [id number] DOB: 08-05-1945 Today's Date: 10/16/2023   History of Present Illness   78 yo male presented to [facility] ED 10/09/23 for N/V and abdominal distension. Pt with ileus, short runs of Vtach, and hypoxic respiratory failure.  10/10/23 transfer to [facility]. PICC placed 5/21. Pt required NG tube placement. PMhx: 10/01/23 Lt TKA, CAD, coronary stent, obesity, HTN, prostate CA, OSA     Clinical Impressions Pt admitted for above, Prior to his Lt TKA pt reports being independent with ADLs and mobility. He currently presents as generally weak and lethargic with decreased activity tolerance. Pt declined EOB/OOB, reports too much nausea and pain but was accepting of UE strengthening education with use of therabands. Supplied pt with two red bands, reviewed exercises and provided handout. Pt limited to about 5 reps at a time at his current level. Educated pt on the risks of being sedentary in bed, OT to continue efforts to progress pt as able. Will determine post acute needs in further session to determine pt capacity to return home.       If plan is discharge home, recommend the following:   Assistance with cooking/housework;Direct supervision/assist for medications management;Assist for transportation;Help with stairs or ramp for entrance;Direct supervision/assist for financial management;A lot of help with bathing/dressing/bathroom;A lot of help with walking and/or transfers     Functional Status Assessment   Patient has had a recent decline in their functional status and demonstrates the ability to make significant improvements in function in a reasonable and predictable amount of time.     Equipment Recommendations   Other (comment) (TBD)     Recommendations for Other Services         Precautions/Restrictions   Precautions Precautions: Fall;Knee;Other (comment) Precaution Booklet Issued: No Recall of  Precautions/Restrictions: Intact Precaution/Restrictions Comments: NG tube, flexiseal, PICC Restrictions Weight Bearing Restrictions Per Provider Order: No LLE Weight Bearing Per Provider Order: Weight bearing as tolerated Other Position/Activity Restrictions: no pillow or bolster under bend of knee at rest, OK to have under ankle to promote L knee extension     Mobility Bed Mobility               General bed mobility comments: pt refused efforts of mobility    Transfers                   General transfer comment: pt refused due to fatigue      Balance       Sitting balance - Comments: NT                                   ADL either performed or assessed with clinical judgement   ADL   Eating/Feeding: NPO   Grooming: Bed level;Set up   Upper Body Bathing: Bed level;Minimal assistance   Lower Body Bathing: Bed level;Total assistance   Upper Body Dressing : Moderate assistance;Bed level   Lower Body Dressing: Bed level;Total assistance       Toileting- Clothing Manipulation and Hygiene: Total assistance Toileting - Clothing Manipulation Details (indicate cue type and reason): flexi seal       General ADL Comments: pt declining bed mobility, educated on red theraband exercises.     Vision         Perception         Praxis         Pertinent  Vitals/Pain Pain Assessment Pain Assessment: Faces Faces Pain Scale: Hurts even more Pain Location: abdomen at rest Pain Descriptors / Indicators: Discomfort, Grimacing, Sore Pain Intervention(s): Monitored during session     Extremity/Trunk Assessment Upper Extremity Assessment Upper Extremity Assessment: Generalized weakness RUE Deficits / Details: hx of R soulder injury, shoudler flexion 70 deg roughly. other joints WFL> RUE Coordination: decreased gross motor           Communication Communication Communication: No apparent difficulties   Cognition Arousal:  Lethargic Behavior During Therapy: WFL for tasks assessed/performed Cognition: No apparent impairments                               Following commands: Intact       Cueing  General Comments   Cueing Techniques: Verbal cues  VSS on 3L, pt feeling generally unwell and nauseous and per RN pt temp noted to be 103.   Exercises General Exercises - Upper Extremity Shoulder Flexion: AROM, Both, 10 reps, Limitations, Theraband, Supine Theraband Level (Shoulder Flexion): Level 2 (Red) Shoulder Flexion Limitations: needs to break down reps to 5 at a time given level of exertion Elbow Flexion: AROM, 10 reps, Theraband, Both Theraband Level (Elbow Flexion): Level 2 (Red) Elbow Extension: AROM, Both, 10 reps, Theraband Theraband Level (Elbow Extension): Level 2 (Red)   Shoulder Instructions      Home Living                                          Prior Functioning/Environment                      OT Problem List: Decreased activity tolerance;Impaired balance (sitting and/or standing);Pain;Decreased strength;Obesity   OT Treatment/Interventions: Self-care/ADL training;Energy conservation;Therapeutic exercise;DME and/or AE instruction;Patient/family education;Therapeutic activities;Balance training      OT Goals(Current goals can be found in the care plan section)   Acute Rehab OT Goals Patient Stated Goal: to get better OT Goal Formulation: With patient Time For Goal Achievement: 10/30/23 Potential to Achieve Goals: Fair ADL Goals Pt/caregiver will Perform Home Exercise Program: Increased strength;Both right and left upper extremity;With theraband;Independently;With written HEP provided Additional ADL Goal #1: Pt will demonstrate understanding of 2 pressure relief strategies to reduce onset of wounds Additional ADL Goal #2: Pt will complete bed mobility assessement to determine capacity for EOB/OOB activity   OT Frequency:  Min 2X/week     Co-evaluation              AM-PAC OT "6 Clicks" Daily Activity     Outcome Measure Help from another person eating meals?: Total Help from another person taking care of personal grooming?: A Little Help from another person toileting, which includes using toliet, bedpan, or urinal?: Total Help from another person bathing (including washing, rinsing, drying)?: A Lot Help from another person to put on and taking off regular upper body clothing?: A Lot Help from another person to put on and taking off regular lower body clothing?: Total 6 Click Score: 10   End of Session Equipment Utilized During Treatment: Other (comment) (theraband) Nurse Communication: Mobility status  Activity Tolerance: Patient limited by fatigue;Patient limited by pain Patient left: in bed;with call bell/phone within reach;with bed alarm set  OT Visit Diagnosis: Muscle weakness (generalized) (M62.81);Other abnormalities of gait and mobility (R26.89);Pain Pain - Right/Left: Left (  abdomen and L knee) Pain - part of body: Knee                Time: 1610-9604 OT Time Calculation (min): 13 min Charges:  OT General Charges $OT Visit: 1 Visit OT Evaluation $OT Eval Moderate Complexity: 1 Mod  10/16/2023  AB, OTR/L  Acute Rehabilitation Services  Office: [phone]   Jorene New 10/16/2023, 3:35 PM

## 2023-10-17 ENCOUNTER — Inpatient Hospital Stay (HOSPITAL_COMMUNITY)

## 2023-10-17 DIAGNOSIS — R509 Fever, unspecified: Secondary | ICD-10-CM | POA: Diagnosis not present

## 2023-10-17 DIAGNOSIS — R140 Abdominal distension (gaseous): Secondary | ICD-10-CM | POA: Diagnosis not present

## 2023-10-17 DIAGNOSIS — R0902 Hypoxemia: Secondary | ICD-10-CM | POA: Diagnosis not present

## 2023-10-17 DIAGNOSIS — K567 Ileus, unspecified: Secondary | ICD-10-CM | POA: Diagnosis not present

## 2023-10-17 DIAGNOSIS — K9189 Other postprocedural complications and disorders of digestive system: Secondary | ICD-10-CM | POA: Diagnosis not present

## 2023-10-17 LAB — BASIC METABOLIC PANEL WITH GFR
Anion gap: 8 (ref 5–15)
Anion gap: 9 (ref 5–15)
BUN: 29 mg/dL — ABNORMAL HIGH (ref 8–23)
BUN: 26 mg/dL — ABNORMAL HIGH (ref 8–23)
CO2: 33 mmol/L — ABNORMAL HIGH (ref 22–32)
CO2: 30 mmol/L (ref 22–32)
Calcium: 8.4 mg/dL — ABNORMAL LOW (ref 8.9–10.3)
Calcium: 8.4 mg/dL — ABNORMAL LOW (ref 8.9–10.3)
Chloride: 97 mmol/L — ABNORMAL LOW (ref 98–111)
Chloride: 101 mmol/L (ref 98–111)
Creatinine, Ser: 0.85 mg/dL (ref 0.61–1.24)
Creatinine, Ser: 0.66 mg/dL (ref 0.61–1.24)
GFR, Estimated: >60 mL/min (ref >60)
GFR, Estimated: >60 mL/min (ref >60)
Glucose, Bld: 164 mg/dL — ABNORMAL HIGH (ref 70–99)
Glucose, Bld: 124 mg/dL — ABNORMAL HIGH (ref 70–99)
Potassium: 3.3 mmol/L — ABNORMAL LOW (ref 3.5–5.1)
Potassium: 3.7 mmol/L (ref 3.5–5.1)
Sodium: 138 mmol/L (ref 135–145)
Sodium: 140 mmol/L (ref 135–145)

## 2023-10-17 LAB — GLUCOSE, CAPILLARY
Glucose-Capillary: 135 mg/dL — ABNORMAL HIGH (ref 70–99)
Glucose-Capillary: 142 mg/dL — ABNORMAL HIGH (ref 70–99)
Glucose-Capillary: 149 mg/dL — ABNORMAL HIGH (ref 70–99)
Glucose-Capillary: 150 mg/dL — ABNORMAL HIGH (ref 70–99)
Glucose-Capillary: 150 mg/dL — ABNORMAL HIGH (ref 70–99)
Glucose-Capillary: 167 mg/dL — ABNORMAL HIGH (ref 70–99)

## 2023-10-17 LAB — CBC
HCT: 29.8 % — ABNORMAL LOW (ref 39.0–52.0)
Hemoglobin: 9.7 g/dL — ABNORMAL LOW (ref 13.0–17.0)
MCH: 29.8 pg (ref 26.0–34.0)
MCHC: 32.6 g/dL (ref 30.0–36.0)
MCV: 91.7 fL (ref 80.0–100.0)
Platelets: 443 10*3/uL — ABNORMAL HIGH (ref 150–400)
RBC: 3.25 MIL/uL — ABNORMAL LOW (ref 4.22–5.81)
RDW: 15 % (ref 11.5–15.5)
WBC: 12.6 10*3/uL — ABNORMAL HIGH (ref 4.0–10.5)
nRBC: 0.4 % — ABNORMAL HIGH (ref 0.0–0.2)

## 2023-10-17 LAB — MAGNESIUM: Magnesium: 2.6 mg/dL — ABNORMAL HIGH (ref 1.7–2.4)

## 2023-10-17 MED ORDER — POTASSIUM CHLORIDE 10 MEQ/50ML IV SOLN
10.0000 meq | INTRAVENOUS | Status: AC
  Administered 2023-10-17 (×6): 10 meq via INTRAVENOUS
  Filled 2023-10-17 (×6): qty 50

## 2023-10-17 MED ORDER — POTASSIUM CHLORIDE 10 MEQ/50ML IV SOLN
10.0000 meq | INTRAVENOUS | Status: AC
Start: 2023-10-17 — End: 2023-10-18
  Administered 2023-10-17 – 2023-10-18 (×4): 10 meq via INTRAVENOUS
  Filled 2023-10-17 (×4): qty 50

## 2023-10-17 MED ORDER — IOPAMIDOL (ISOVUE-370) INJECTION 76%
75.0000 mL | Freq: Once | INTRAVENOUS | Status: AC | PRN
  Administered 2023-10-17: 75 mL via INTRAVENOUS

## 2023-10-17 MED ORDER — POTASSIUM CHLORIDE 2 MEQ/ML IV SOLN
INTRAVENOUS | Status: AC
  Filled 2023-10-17: qty 1209.6

## 2023-10-17 MED ORDER — LACTATED RINGERS IV BOLUS
1000.0000 mL | Freq: Once | INTRAVENOUS | Status: AC
  Administered 2023-10-17: 999 mL via INTRAVENOUS

## 2023-10-17 NOTE — Progress Notes (Signed)
 PROGRESS NOTE    Chase Combs  ZOX:[id number] DOB: Aug 15, 1945 DOA: 10/09/2023 PCP: Ilsa Maltese, PA   Brief Narrative:    78 y/o make with PMH significant for Left TKR on 10/01/2023 for osteoarthritis, CAD S/p MI s/p coronary stent x 2, who presented to [facility] with c/o feeling "unwell" x 1 week since his TKR.  Patient noted to have distended abdomen, Tachypnic and Tachycardiac upon arrival to ED and he was hypoxic.  A CT abdomen/pelvis and CTA for Pulmonary emboli were done.  He was noted to have frequent PVCs and possible short runs of V Tach and Amiodarone  drip was started.  CT scan abd-no definitive obstruction and CTA no Pulmonary emboli.  Patient noted to have abdominal distension and hypoxia post op as well during several days of recovery with one day where he had some vomiting and PT was canceled.  He was admitted for acute hypoxemic respiratory failure secondary type of ventilation due to markedly distended abdomen related to opiate induced ileus.  PICC line placed 5/24 and patient noted to have fever which has now improved.  Assessment & Plan:   Principal Problem:   Hypoxia Active Problems:   Ileus, postoperative (HCC)   Fever, unspecified   Leukocytosis   Hypokalemia  Assessment and Plan:   Acute hypoxemic respiratory failure: Resolved Likely in the setting of hypoventilation from markedly distended abdomen.  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 - NG tube placed two days ago still having a lot of output, as well as liquid stools.  Patient has a fecal management system.   - Abdominal distention getting better, having more bowel sounds, Liver still shows quite distended bowel.   - GI following, advised to continue Relistor  and Reglan   - Patient receiving nutrition through TPN, PICC line replaced 5/24 -Appreciate GI recs  with possible need for CT vs Gen Surgery eval. -C diff negative  Hypokalemia-persistent -Due to GI losses -Replete and reevaluate in a.m.  Fever-resolved -Chest x-ray with atelectasis, but no infiltrate -C. difficile quick screen per GI -UA neg     CAD s/p CABG: Stable no symptoms, IV Lopressor  initiated Frequent PVCs and NSVT HTN: Blood pressure elevated - Patient n.p.o. unable to take p.o. hypertension I started the patient on IV scheduled Lopressor  q6h Once patient is able to tolerate p.o. can initiate p.o. beta-blocker.   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   Consultants:  GI  Procedures:  None  Antimicrobials:  Anti-infectives (From admission, onward)    Start     Dose/Rate Route Frequency Ordered Stop   10/10/23 0600  piperacillin -tazobactam (ZOSYN ) IVPB 3.375 g  Status:  Discontinued        3.375 g 12.5 mL/hr over 240 Minutes Intravenous Every 8 hours 10/10/23 0445 10/11/23 0951   10/09/23 2345  vancomycin  (VANCOCIN ) 500 mg in sodium chloride  0.9 % 100 mL IVPB       Placed in "And" Linked Group   500 mg 100 mL/hr over 60 Minutes Intravenous  Once 10/09/23 2231 10/10/23 0103   10/09/23 2245  vancomycin  (VANCOCIN ) IVPB 1000 mg/200 mL premix       Placed in "And" Linked Group   1,000 mg 200 mL/hr over 60  Minutes Intravenous  Once 10/09/23 2231 10/09/23 2359   10/09/23 2215  vancomycin  (VANCOCIN ) IVPB 1000 mg/200 mL premix  Status:  Discontinued        1,000 mg 200 mL/hr over 60 Minutes Intravenous  Once 10/09/23 2212 10/09/23 2227   10/09/23 2215  piperacillin -tazobactam (ZOSYN ) IVPB 3.375 g        3.375 g 100 mL/hr over 30 Minutes Intravenous  Once 10/09/23 2212 10/09/23 2330       Subjective: Patient seen and evaluated today with no new acute complaints or concerns. No  acute concerns or events noted overnight. Fever has improved along with his abdominal pain. He continues to have high stool ouput as well as NG tube output.  Objective: Vitals:   10/17/23 0003 10/17/23 0351 10/17/23 0500 10/17/23 0812  BP: 106/72 108/71  117/76  Pulse: 65 100  92  Resp: (!) 24 20    Temp: (!) 100.4 F (38 C) 99 F (37.2 C)  98.1 F (36.7 C)  TempSrc: Oral Oral    SpO2: 96% 96%  97%  Weight:   95.2 kg     Intake/Output Summary (Last 24 hours) at 10/17/2023 1144 Last data filed at 10/17/2023 0600 Gross per 24 hour  Intake 1089.77 ml  Output 2850 ml  Net -1760.23 ml   Filed Weights   10/14/23 0500 10/16/23 0500 10/17/23 0500  Weight: 96.7 kg 95.3 kg 95.2 kg    Examination:  General exam: Appears mildly agitated/uncomfortable Respiratory system: Decreased to auscultation, nasal cannula present Cardiovascular system: S1 & S2 heard, RRR.  Gastrointestinal system: Abdomen is soft.  NG tube to LIS Central nervous system: Alert and awake Extremities: No edema Skin: No significant lesions noted Psychiatry: Flat affect. Rectal tube with high liquid stool output.    Data Reviewed: I have personally reviewed following labs and imaging studies  CBC: Recent Labs  Lab 10/11/23 0847 10/15/23 2149 10/16/23 1054 10/17/23 0300  WBC 3.5* 10.2 16.5* 12.6*  HGB 10.0* 10.2* 10.1* 9.7*  HCT 30.5* 31.2* 30.3* 29.8*  MCV 90.5 91.5 89.9 91.7  PLT 473* 520* 473* 443*   Basic Metabolic Panel: Recent Labs  Lab 10/13/23 0159 10/14/23 0323 10/14/23 1700 10/15/23 0611 10/16/23 1054 10/17/23 0300  NA 136 138 140 140 138 138  K 3.8 2.8* 3.3* 3.2* 3.2* 3.3*  CL 94* 93* 96* 99 97* 97*  CO2 31 33* 34* 32 31 33*  GLUCOSE 125* 228* 201* 238* 165* 164*  BUN 44* 36* 29* 22 21 29*  CREATININE 0.84 0.84 0.70 0.68 0.99 0.85  CALCIUM  8.7* 8.3* 8.3* 8.3* 8.1* 8.4*  MG 2.3 2.4 2.4 2.5* 2.0 2.6*  PHOS 2.5 2.3* 2.8 2.7 2.9  --    GFR: Estimated Creatinine Clearance: 85.5  mL/min (by C-G formula based on SCr of 0.85 mg/dL). Liver Function Tests: Recent Labs  Lab 10/14/23 0323 10/14/23 1700 10/15/23 0611  AST 25  --   --   ALT 34  --   --   ALKPHOS 71  --   --   BILITOT 0.9  --   --   PROT 5.1*  --   --   ALBUMIN 2.1* 2.1* 2.1*   No results for input(s): "LIPASE", "AMYLASE" in the last 168 hours. No results for input(s): "AMMONIA" in the last 168 hours. Coagulation Profile: No results for input(s): "INR", "PROTIME" in the last 168 hours.  Cardiac Enzymes: No results for input(s): "CKTOTAL", "CKMB", "CKMBINDEX", "TROPONINI" in the last 168 hours. BNP (last  3 results) No results for input(s): "PROBNP" in the last 8760 hours. HbA1C: No results for input(s): "HGBA1C" in the last 72 hours. CBG: Recent Labs  Lab 10/16/23 1628 10/16/23 2001 10/17/23 0000 10/17/23 0349 10/17/23 0735  GLUCAP 167* 125* 150* 167* 149*   Lipid Profile: No results for input(s): "CHOL", "HDL", "LDLCALC", "TRIG", "CHOLHDL", "LDLDIRECT" in the last 72 hours. Thyroid  Function Tests: No results for input(s): "TSH", "T4TOTAL", "FREET4", "T3FREE", "THYROIDAB" in the last 72 hours. Anemia Panel: No results for input(s): "VITAMINB12", "FOLATE", "FERRITIN", "TIBC", "IRON", "RETICCTPCT" in the last 72 hours. Sepsis Labs: No results for input(s): "PROCALCITON", "LATICACIDVEN" in the last 168 hours.   Recent Results (from the past 240 hours)  Culture, blood (Routine x 2)     Status: None   Collection Time: 10/09/23  9:42 PM   Specimen: BLOOD  Result Value Ref Range Status   Specimen Description   Final    BLOOD RIGHT ANTECUBITAL Performed at [facility], [address], [place], [place] [address]    Special Requests   Final    BOTTLES DRAWN AEROBIC AND ANAEROBIC Blood Culture adequate volume Performed at [facility], [address] [place], [place] [address]    Culture   Final    NO GROWTH 5 DAYS Performed at [facility], [address], [place], [place] [address]    Report Status 10/15/2023 FINAL  Final  Resp panel by RT-PCR (RSV, Flu A&B, Covid) Anterior Nasal Swab     Status: None   Collection Time: 10/09/23 10:07 PM   Specimen: Anterior Nasal Swab  Result Value Ref Range Status   SARS Coronavirus 2 by RT PCR NEGATIVE NEGATIVE Final    Comment: (NOTE) SARS-CoV-2 target nucleic acids are NOT DETECTED.  The SARS-CoV-2 RNA is generally detectable in upper respiratory specimens during the acute phase of infection. The lowest concentration of SARS-CoV-2 viral copies this assay can detect is 138 copies/mL. A negative result does not preclude SARS-Cov-2 infection and should not be used as the sole basis for treatment or other patient management decisions. A negative result may occur with  improper specimen collection/handling, submission of specimen other than nasopharyngeal swab, presence of viral mutation(s) within the areas targeted by this assay, and inadequate number of viral copies(<138 copies/mL). A negative result must be combined with clinical observations, patient history, and epidemiological information. The expected result is Negative.  Fact Sheet for Patients:  BloggerCourse.com  Fact Sheet for Healthcare Providers:  SeriousBroker.it  This test is no t yet approved or cleared by the [place]  FDA and  has been authorized for detection and/or diagnosis of SARS-CoV-2 by FDA under an Emergency Use Authorization (EUA). This EUA will remain  in effect (meaning this test can be used) for the duration of the COVID-19 declaration under Section 564(b)(1) of the Act, 21 U.S.C.section 360bbb-3(b)(1), unless the authorization is terminated  or revoked sooner.       Influenza A by PCR NEGATIVE NEGATIVE Final   Influenza B by PCR NEGATIVE NEGATIVE Final    Comment: (NOTE) The Xpert Xpress SARS-CoV-2/FLU/RSV plus assay is intended as an  aid in the diagnosis of influenza from Nasopharyngeal swab specimens and should not be used as a sole basis for treatment. Nasal washings and aspirates are unacceptable for Xpert Xpress SARS-CoV-2/FLU/RSV testing.  Fact Sheet for Patients: BloggerCourse.com  Fact Sheet for Healthcare Providers: SeriousBroker.it  This test is not yet approved or cleared by the [place]  FDA and has been  authorized for detection and/or diagnosis of SARS-CoV-2 by FDA under an Emergency Use Authorization (EUA). This EUA will remain in effect (meaning this test can be used) for the duration of the COVID-19 declaration under Section 564(b)(1) of the Act, 21 U.S.C. section 360bbb-3(b)(1), unless the authorization is terminated or revoked.     Resp Syncytial Virus by PCR NEGATIVE NEGATIVE Final    Comment: (NOTE) Fact Sheet for Patients: BloggerCourse.com  Fact Sheet for Healthcare Providers: SeriousBroker.it  This test is not yet approved or cleared by the [place]  FDA and has been authorized for detection and/or diagnosis of SARS-CoV-2 by FDA under an Emergency Use Authorization (EUA). This EUA will remain in effect (meaning this test can be used) for the duration of the COVID-19 declaration under Section 564(b)(1) of the Act, 21 U.S.C. section 360bbb-3(b)(1), unless the authorization is terminated or revoked.  Performed at [facility], [address] [place], [place] [address]   Culture, blood (Routine X 2) w Reflex to ID Panel     Status: None   Collection Time: 10/09/23 10:20 PM   Specimen: BLOOD LEFT WRIST  Result Value Ref Range Status   Specimen Description   Final    BLOOD LEFT WRIST Performed at [facility], [address] [place], [place] [address]    Special Requests   Final    BOTTLES DRAWN AEROBIC AND ANAEROBIC Blood Culture adequate  volume Performed at [facility], [address], [place], [place] [address]    Culture   Final    NO GROWTH 5 DAYS Performed at [facility], [address], [place], [place] [address]    Report Status 10/15/2023 FINAL  Final  MRSA Next Gen by PCR, Nasal     Status: None   Collection Time: 10/10/23  3:37 AM   Specimen: Nasal Mucosa; Nasal Swab  Result Value Ref Range Status   MRSA by PCR Next Gen NOT DETECTED NOT DETECTED Final    Comment: (NOTE) The GeneXpert MRSA Assay (FDA approved for NASAL specimens only), is one component of a comprehensive MRSA colonization surveillance program. It is not intended to diagnose MRSA infection nor to guide or monitor treatment for MRSA infections. Test performance is not FDA approved in patients less than 39 years old. Performed at [facility], [address], [place], [place] [address]   C Difficile Quick Screen w PCR reflex     Status: None   Collection Time: 10/10/23  6:41 AM   Specimen: STOOL  Result Value Ref Range Status   C Diff antigen NEGATIVE NEGATIVE Final   C Diff toxin NEGATIVE NEGATIVE Final   C Diff interpretation No C. difficile detected.  Final    Comment: Performed at [facility], [address], [place], [place] [address]  Gastrointestinal Panel by PCR , Stool     Status: None   Collection Time: 10/10/23  6:41 AM   Specimen: STOOL  Result Value Ref Range Status   Campylobacter species NOT DETECTED NOT DETECTED Final   Plesimonas shigelloides NOT DETECTED NOT DETECTED Final   Salmonella species NOT DETECTED NOT DETECTED Final   Yersinia enterocolitica NOT DETECTED NOT DETECTED Final   Vibrio species NOT DETECTED NOT DETECTED Final   Vibrio cholerae NOT DETECTED NOT DETECTED Final   Enteroaggregative E coli (EAEC) NOT DETECTED NOT DETECTED Final   Enteropathogenic E coli (EPEC) NOT DETECTED NOT DETECTED Final   Enterotoxigenic E coli (ETEC) NOT DETECTED NOT  DETECTED Final   Shiga  like toxin producing E coli (STEC) NOT DETECTED NOT DETECTED Final   Shigella/Enteroinvasive E coli (EIEC) NOT DETECTED NOT DETECTED Final   Cryptosporidium NOT DETECTED NOT DETECTED Final   Cyclospora cayetanensis NOT DETECTED NOT DETECTED Final   Entamoeba histolytica NOT DETECTED NOT DETECTED Final   Giardia lamblia NOT DETECTED NOT DETECTED Final   Adenovirus F40/41 NOT DETECTED NOT DETECTED Final   Astrovirus NOT DETECTED NOT DETECTED Final   Norovirus GI/GII NOT DETECTED NOT DETECTED Final   Rotavirus A NOT DETECTED NOT DETECTED Final   Sapovirus (I, II, IV, and V) NOT DETECTED NOT DETECTED Final    Comment: Performed at [facility], [address] [place], [place] [address]  C Difficile Quick Screen (NO PCR Reflex)     Status: None   Collection Time: 10/16/23 12:31 PM   Specimen: STOOL  Result Value Ref Range Status   C Diff antigen NEGATIVE NEGATIVE Final   C Diff toxin NEGATIVE NEGATIVE Final   C Diff interpretation No C. difficile detected.  Final    Comment: Performed at [facility], [address] [place], [place] [address]         Radiology Studies: DG Abd 1 View Result Date: 10/16/2023 CLINICAL DATA:  NG tube placement. EXAM: ABDOMEN - 1 VIEW COMPARISON:  10/16/2023. FINDINGS: Multiple loops of distended small bowel are noted in the upper abdomen measuring up to 5 cm. An enteric tube terminates in the stomach. Atelectasis is seen at the lung bases bilaterally. IMPRESSION: 1. NG tube terminates in the stomach. 2. Distended loops of small bowel in the upper abdomen measuring up to 5 cm, improved the prior exam. Electronically Signed   By: Wyvonnia Heimlich M.D.   On: 10/16/2023 16:53   DG Abd 1 View Result Date: 10/16/2023 CLINICAL DATA:  Ileus EXAM: ABDOMEN-supine views 4 COMPARISON:  X-ray 10/15/2023. FINDINGS: Enteric tube along the stomach. The stomach is distended with air. There are several dilated loops of small bowel throughout the mid abdomen again  measuring up to 6.6 cm today. Minimal colonic gas. Surgical changes in the pelvis. No obvious free air on the supine radiographs. Multiple overlapping artifacts. Degenerative changes. IMPRESSION: Severely dilated loops of air-filled small bowel throughout the mid abdomen with minimal colonic gas. Please correlate for developing obstruction. Please correlate with specific clinical presentation and if needed repeat CT scan is recommended to further delineate and confirm etiology. Electronically Signed   By: Adrianna Horde M.D.   On: 10/16/2023 13:47   DG CHEST PORT 1 VIEW Result Date: 10/16/2023 CLINICAL DATA:  Fever. EXAM: PORTABLE CHEST 1 VIEW COMPARISON:  Chest radiograph yesterday FINDINGS: Stable positioning of left upper extremity PICC which is looped in the region of the SVC azygous confluence. Enteric tube remains in place. Stable heart size and mediastinal contours. Stable appearance of bibasilar atelectasis. No progressive airspace disease. No pneumothorax. Normal pulmonary vasculature. IMPRESSION: 1. Stable positioning of left upper extremity PICC which is looped in the region of the SVC azygous confluence. Repositioning is again recommended. 2. Stable bibasilar atelectasis. Electronically Signed   By: Chadwick Colonel M.D.   On: 10/16/2023 11:31   US  EKG SITE RITE Result Date: 10/16/2023 If Site Rite image not attached, placement could not be confirmed due to current cardiac rhythm.  DG Chest Port 1 View Result Date: 10/15/2023 CLINICAL DATA:  PICC placement EXAM: PORTABLE CHEST 1 VIEW COMPARISON:  X-ray 10/15/2023 FINDINGS: As seen on the previous  the left-sided PICC has a tip curled as the catheter reaches the expected location of the SVC azygous region. Recommend repositioning. Enteric tube in place with tip extending beneath the diaphragm. Underinflation with persistent basilar bandlike opacities. Atelectasis is favored. No pneumothorax, effusion or edema. Stable cardiopericardial silhouette.  IMPRESSION: Underinflation with basilar atelectasis.  Enteric tube. Stable appearance to the left-sided PICC with curving of the tip of the catheter. Recommend repositioning as previously stated. Electronically Signed   By: Adrianna Horde M.D.   On: 10/15/2023 20:35   DG CHEST PORT 1 VIEW Result Date: 10/15/2023 CLINICAL DATA:  PICC in place. EXAM: PORTABLE CHEST 1 VIEW COMPARISON:  Chest CTA 10/10/2023 FINDINGS: Left upper extremity PICC in place, there is a loop in the distal catheter with tip in the region of the upper SVC directed superolaterally. Recommend repositioning. The heart is upper normal in size. Aortic atherosclerosis. Subsegmental atelectasis/scarring in both lung bases. No pulmonary edema. No pleural fluid or pneumothorax. IMPRESSION: 1. Left upper extremity PICC in place. There is a loop in the distal catheter with tip in the region of the upper SVC directed superolaterally. Recommend repositioning. 2. Subsegmental atelectasis/scarring in both lung bases. Electronically Signed   By: Chadwick Colonel M.D.   On: 10/15/2023 19:18        Scheduled Meds:  Chlorhexidine  Gluconate Cloth  6 each Topical Daily   heparin   5,000 Units Subcutaneous Q8H   insulin  aspart  0-20 Units Subcutaneous Q4H   lidocaine   1 patch Transdermal Q24H   metoprolol  tartrate  5 mg Intravenous Q6H   ondansetron  (ZOFRAN ) IV  4 mg Intravenous Once   sodium chloride  flush  10-40 mL Intracatheter Q12H   traMADol   50 mg Oral Once   Continuous Infusions:  potassium chloride  10 mEq (10/17/23 1141)   TPN ADULT (ION) 60 mL/hr at 10/16/23 1716   TPN ADULT (ION)       LOS: 7 days    Time spent: 55 minutes    Javone Ybanez D Mason Sole, DO [facility]  If 7PM-7AM, please contact night-coverage [email or web address] 10/17/2023, 11:44 AM

## 2023-10-17 NOTE — Progress Notes (Signed)
 Patient ID: Chase Combs, male   DOB: 1946-05-16, 78 y.o.   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 difficile quick screen negative UA negative Chest x-ray yesterday stable bibasilar atelectasis PICC looped in the region of the SVC azygous confluence repositioning recommended  KUB yesterday-severely dilated loops of air-filled small bowel throughout the mid abdomen with minimal colonic gas rule out elevating obstruction  Today WBC 12.6/hemoglobin 9.7/hematocrit 29.8 Potassium 3.3 chloride 97/CO2 33/BUN 29/creatinine 0.85  Mg 2.6  Patient says he is not sure how he feels, no nausea no vomiting, says his abdomen feels flatter to him mild lower abdominal discomfort, nothing severe starting to talk about food, tolerating ice chips asking if he is going to get better and be able to get out of here.    Objective   Vital signs in last 24 hours: Temp:  [97.9 F (36.6 C)-100.4 F (38 C)] 98.2 F (36.8 C) (05/25 1219) Pulse Rate:  [60-100] 91 (05/25 1219) Resp:  [16-24] 20 (05/25 0351) BP: (106-164)/(70-131) 117/79 (05/25 1219) SpO2:  [92 %-100 %] 99 % (05/25 1219) Weight:  [95.2 kg] 95.2 kg (05/25 0500) Last BM Date : 10/16/23 General:    Elderly white male in NAD looks better today, brighter, visiting with family ,NG in place Heart:  Regular rate and rhythm; no murmurs Lungs: Respirations even and unlabored, creased breath sounds bilateral bases Abdomen: Definitely softer than yesterday, no focal tenderness kind of mild diffuse tenderness across the lower abdomen, bowel sounds are more active than last exam Extremities:  Without edema. Neurologic:  Alert and oriented,  grossly normal neurologically. Psych:  Cooperative. Normal mood  and affect.  Intake/Output from previous day: 05/24 0701 - 05/25 0700 In: 1099.8 [I.V.:1099.7; IV Piggyback:0.1] Out: 2850 [Urine:200; Emesis/NG output:2150; Stool:500] Intake/Output this shift: Total I/O In: -  Out: 800 [Stool:800]  Lab Results: Recent Labs    10/15/23 2149 10/16/23 1054 10/17/23 0300  WBC 10.2 16.5* 12.6*  HGB 10.2* 10.1* 9.7*  HCT 31.2* 30.3* 29.8*  PLT 520* 473* 443*   BMET Recent Labs    10/15/23 0611 10/16/23 1054 10/17/23 0300  NA 140 138 138  K 3.2* 3.2* 3.3*  CL 99 97* 97*  CO2 32 31 33*  GLUCOSE 238* 165* 164*  BUN 22 21 29*  CREATININE 0.68 0.99 0.85  CALCIUM  8.3* 8.1* 8.4*   LFT Recent Labs    10/15/23 0611  ALBUMIN 2.1*   PT/INR No results for input(s): "LABPROT", "INR" in the last 72 hours.  Studies/Results: DG Abd 1 View Result Date: 10/16/2023 CLINICAL DATA:  NG tube placement. EXAM: ABDOMEN - 1 VIEW COMPARISON:  10/16/2023. FINDINGS: Multiple loops of distended small bowel are noted in the upper abdomen measuring up to 5 cm. An enteric tube terminates in the stomach. Atelectasis is seen at the lung bases bilaterally. IMPRESSION: 1. NG tube terminates in the stomach. 2. Distended loops of small bowel in the upper abdomen measuring up to 5 cm, improved the prior exam. Electronically Signed   By: Wyvonnia Heimlich M.D.   On: 10/16/2023 16:53   DG Abd 1 View Result Date: 10/16/2023 CLINICAL DATA:  Ileus EXAM: ABDOMEN-supine views 4 COMPARISON:  X-ray 10/15/2023. FINDINGS: Enteric tube along the  stomach. The stomach is distended with air. There are several dilated loops of small bowel throughout the mid abdomen again measuring up to 6.6 cm today. Minimal colonic gas. Surgical changes in the pelvis. No obvious free air on the supine radiographs. Multiple overlapping artifacts. Degenerative changes. IMPRESSION: Severely dilated loops of air-filled small bowel throughout the mid abdomen with minimal colonic gas. Please correlate for developing  obstruction. Please correlate with specific clinical presentation and if needed repeat CT scan is recommended to further delineate and confirm etiology. Electronically Signed   By: Adrianna Horde M.D.   On: 10/16/2023 13:47   DG CHEST PORT 1 VIEW Result Date: 10/16/2023 CLINICAL DATA:  Fever. EXAM: PORTABLE CHEST 1 VIEW COMPARISON:  Chest radiograph yesterday FINDINGS: Stable positioning of left upper extremity PICC which is looped in the region of the SVC azygous confluence. Enteric tube remains in place. Stable heart size and mediastinal contours. Stable appearance of bibasilar atelectasis. No progressive airspace disease. No pneumothorax. Normal pulmonary vasculature. IMPRESSION: 1. Stable positioning of left upper extremity PICC which is looped in the region of the SVC azygous confluence. Repositioning is again recommended. 2. Stable bibasilar atelectasis. Electronically Signed   By: Chadwick Colonel M.D.   On: 10/16/2023 11:31   US  EKG SITE RITE Result Date: 10/16/2023 If Site Rite image not attached, placement could not be confirmed due to current cardiac rhythm.  DG Chest Port 1 View Result Date: 10/15/2023 CLINICAL DATA:  PICC placement EXAM: PORTABLE CHEST 1 VIEW COMPARISON:  X-ray 10/15/2023 FINDINGS: As seen on the previous the left-sided PICC has a tip curled as the catheter reaches the expected location of the SVC azygous region. Recommend repositioning. Enteric tube in place with tip extending beneath the diaphragm. Underinflation with persistent basilar bandlike opacities. Atelectasis is favored. No pneumothorax, effusion or edema. Stable cardiopericardial silhouette. IMPRESSION: Underinflation with basilar atelectasis.  Enteric tube. Stable appearance to the left-sided PICC with curving of the tip of the catheter. Recommend repositioning as previously stated. Electronically Signed   By: Adrianna Horde M.D.   On: 10/15/2023 20:35   DG CHEST PORT 1 VIEW Result Date: 10/15/2023 CLINICAL DATA:   PICC in place. EXAM: PORTABLE CHEST 1 VIEW COMPARISON:  Chest CTA 10/10/2023 FINDINGS: Left upper extremity PICC in place, there is a loop in the distal catheter with tip in the region of the upper SVC directed superolaterally. Recommend repositioning. The heart is upper normal in size. Aortic atherosclerosis. Subsegmental atelectasis/scarring in both lung bases. No pulmonary edema. No pleural fluid or pneumothorax. IMPRESSION: 1. Left upper extremity PICC in place. There is a loop in the distal catheter with tip in the region of the upper SVC directed superolaterally. Recommend repositioning. 2. Subsegmental atelectasis/scarring in both lung bases. Electronically Signed   By: Chadwick Colonel M.D.   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  Discharged home but then developed problems with abdominal distention nausea vomiting and admitted 01/10/2024 with acute severe ileus  Patient has had a prolonged course, despite NG decompression, neostigmine, Relistor , IV Reglan  and smog enemas  Yesterday he developed fever to 102-workup thus far unrevealing, Stool for C. difficile negative Blood cultures were not done  Abdominal films yesterday showed progressive small bowel distention and improved colonic distention, some concern for developing small bowel obstruction.  Labs today improved, patient looks better today and abdomen significantly softer with bowel sounds present Decreased stool  output, good NG output  #2 persistent hypokalemia needs continued aggressive replacement to keep potassium closer to 4 for good bowel function #3 nutrition-on TPN  Plan; Have ordered CT of the abdomen and pelvis to reassess and rule out any component of small bowel obstruction or inflammatory process  Defer question of blood cultures to hospitalist  Continue NG to low intermittent suction Replace potassium Continue IV  Reglan  Recommendations pending results of CT  Encouraged that abdominal exam has improved today.       Principal Problem:   Hypoxia Active Problems:   Ileus, postoperative (HCC)   Fever, unspecified   Leukocytosis   Hypokalemia     LOS: 7 days   Treina Arscott EsterwoodPA-C  10/17/2023, 1:18 PM

## 2023-10-17 NOTE — Progress Notes (Incomplete)
 Progress Note  Primary GI: {acdocs:27040}  LOS: 7 days   Chief Complaint:***   Subjective    {ACfamilyinroom:28877} ***     Objective   Vital signs in last 24 hours: Temp:  [97.7 F (36.5 C)-100.4 F (38 C)] 97.7 F (36.5 C) (05/25 1607) Pulse Rate:  [65-100] 95 (05/25 1607) Resp:  [20-24] 20 (05/25 0351) BP: (106-133)/(65-79) 133/65 (05/25 1607) SpO2:  [96 %-99 %] 99 % (05/25 1607) Weight:  [95.2 kg] 95.2 kg (05/25 0500) Last BM Date : 10/17/23 Last BM recorded by nurses in past 5 days Stool Type: Type 7 (Liquid consistency with no solid pieces) (10/17/2023  6:00 AM)  General:   male in no acute distress *** Heart:  Regular rate and rhythm; no murmurs Pulm: Clear anteriorly; no wheezing Abdomen: soft, nondistended, normal bowel sounds in all quadrants. Nontender without guarding. No organomegaly appreciated. Extremities:  No edema Neurologic:  Alert and  oriented x4;  No focal deficits.  Psych:  Cooperative. Normal mood and affect.  Intake/Output from previous day: 05/24 0701 - 05/25 0700 In: 1099.8 [I.V.:1099.7; IV Piggyback:0.1] Out: 2850 [Urine:200; Emesis/NG output:2150; Stool:500] Intake/Output this shift: Total I/O In: -  Out: 45 [Stool:45]  Studies/Results: CT ABDOMEN PELVIS W CONTRAST Result Date: 10/17/2023 CLINICAL DATA:  Prolonged ileus, abnormal x-ray EXAM: CT ABDOMEN AND PELVIS WITH CONTRAST TECHNIQUE: Multidetector CT imaging of the abdomen and pelvis was performed using the standard protocol following bolus administration of intravenous contrast. RADIATION DOSE REDUCTION: This exam was performed according to the departmental dose-optimization program which includes automated exposure control, adjustment of the mA and/or kV according to patient size and/or use of iterative reconstruction technique. CONTRAST:  75mL ISOVUE-370 IOPAMIDOL (ISOVUE-370) INJECTION 76% COMPARISON:  10/09/2023, 10/06/2023 FINDINGS: Lower chest: There is patchy airspace  disease seen throughout the right lung, new since prior study, consistent with infection or aspiration. Areas of linear consolidation within the inferior dependent lower lobes bilaterally may reflect superimposed atelectasis. No effusion or pneumothorax. Central venous catheter tip at the atriocaval junction. Hepatobiliary: No focal liver abnormality is seen. No gallstones, gallbladder wall thickening, or biliary dilatation. Pancreas: Unremarkable. No pancreatic ductal dilatation or surrounding inflammatory changes. Spleen: Normal in size without focal abnormality. Adrenals/Urinary Tract: Stable appearance of the kidneys. No urinary tract calculi or obstructive uropathy. The adrenals and bladder are stable. Stomach/Bowel: Distended fluid-filled loops of small bowel are identified, measuring up to 4.2 cm within the mid jejunum. Gas fluid levels are seen throughout the distended small bowel. No evidence of abrupt transition. The colon is decompressed, with diffuse colonic wall thickening compatible with inflammatory or infectious colitis. This has developed in the interim since prior study. Superimposed diverticulosis of the descending and sigmoid colon. Rectal tube is in place. Enteric catheter tip within the lumen of the gastric antrum. Vascular/Lymphatic: Aortic atherosclerosis. No enlarged abdominal or pelvic lymph nodes. Reproductive: Stable enlargement of the prostate. Other: No free fluid or free intraperitoneal gas. Small fat containing umbilical hernia. Musculoskeletal: No acute or destructive bony abnormalities. Reconstructed images demonstrate no additional findings. IMPRESSION: 1. Diffuse colonic wall thickening which has developed since the prior study, consistent with inflammatory or infectious colitis. 2. Distended fluid-filled loops of small bowel with multiple gas fluid levels, most pronounced within the jejunum. There is no evidence of abrupt transition point to suggest obstruction, and findings  could reflect sequela of ileus or diarrheal illness. 3. Interval development of multifocal right-sided airspace disease consistent with aspiration or infection. 4. Stable fat containing umbilical hernia.  5. Enlarged prostate. 6. Stable distal colonic diverticulosis. 7.  Aortic Atherosclerosis (ICD10-I70.0). Electronically Signed   By: Bobbye Burrow M.D.   On: 10/17/2023 20:10   DG Abd 1 View Result Date: 10/16/2023 CLINICAL DATA:  NG tube placement. EXAM: ABDOMEN - 1 VIEW COMPARISON:  10/16/2023. FINDINGS: Multiple loops of distended small bowel are noted in the upper abdomen measuring up to 5 cm. An enteric tube terminates in the stomach. Atelectasis is seen at the lung bases bilaterally. IMPRESSION: 1. NG tube terminates in the stomach. 2. Distended loops of small bowel in the upper abdomen measuring up to 5 cm, improved the prior exam. Electronically Signed   By: Wyvonnia Heimlich M.D.   On: 10/16/2023 16:53   DG Abd 1 View Result Date: 10/16/2023 CLINICAL DATA:  Ileus EXAM: ABDOMEN-supine views 4 COMPARISON:  X-ray 10/15/2023. FINDINGS: Enteric tube along the stomach. The stomach is distended with air. There are several dilated loops of small bowel throughout the mid abdomen again measuring up to 6.6 cm today. Minimal colonic gas. Surgical changes in the pelvis. No obvious free air on the supine radiographs. Multiple overlapping artifacts. Degenerative changes. IMPRESSION: Severely dilated loops of air-filled small bowel throughout the mid abdomen with minimal colonic gas. Please correlate for developing obstruction. Please correlate with specific clinical presentation and if needed repeat CT scan is recommended to further delineate and confirm etiology. Electronically Signed   By: Adrianna Horde M.D.   On: 10/16/2023 13:47   DG CHEST PORT 1 VIEW Result Date: 10/16/2023 CLINICAL DATA:  Fever. EXAM: PORTABLE CHEST 1 VIEW COMPARISON:  Chest radiograph yesterday FINDINGS: Stable positioning of left upper  extremity PICC which is looped in the region of the SVC azygous confluence. Enteric tube remains in place. Stable heart size and mediastinal contours. Stable appearance of bibasilar atelectasis. No progressive airspace disease. No pneumothorax. Normal pulmonary vasculature. IMPRESSION: 1. Stable positioning of left upper extremity PICC which is looped in the region of the SVC azygous confluence. Repositioning is again recommended. 2. Stable bibasilar atelectasis. Electronically Signed   By: Chadwick Colonel M.D.   On: 10/16/2023 11:31   US  EKG SITE RITE Result Date: 10/16/2023 If Site Rite image not attached, placement could not be confirmed due to current cardiac rhythm.   Lab Results: Recent Labs    10/15/23 2149 10/16/23 1054 10/17/23 0300  WBC 10.2 16.5* 12.6*  HGB 10.2* 10.1* 9.7*  HCT 31.2* 30.3* 29.8*  PLT 520* 473* 443*   BMET Recent Labs    10/16/23 1054 10/17/23 0300 10/17/23 1835  NA 138 138 140  K 3.2* 3.3* 3.7  CL 97* 97* 101  CO2 31 33* 30  GLUCOSE 165* 164* 124*  BUN 21 29* 26*  CREATININE 0.99 0.85 0.66  CALCIUM  8.1* 8.4* 8.4*   LFT Recent Labs    10/15/23 0611  ALBUMIN 2.1*   PT/INR No results for input(s): "LABPROT", "INR" in the last 72 hours.   Scheduled Meds:  Chlorhexidine  Gluconate Cloth  6 each Topical Daily   heparin   5,000 Units Subcutaneous Q8H   insulin  aspart  0-20 Units Subcutaneous Q4H   lidocaine   1 patch Transdermal Q24H   metoprolol  tartrate  5 mg Intravenous Q6H   ondansetron  (ZOFRAN ) IV  4 mg Intravenous Once   sodium chloride  flush  10-40 mL Intracatheter Q12H   traMADol   50 mg Oral Once   Continuous Infusions:  potassium chloride      TPN ADULT (ION) 90 mL/hr at 10/17/23 1816  Patient profile:   78 year old male s/p left knee replacement 10/01/2023 presenting with abdominal distention, nausea, vomiting with admission 10/10/2023 for postop acute severe ileus    Impression:   Postop ileus S/p NG decompression,  neostigmine, relistor , IV Reglan , and smog enemas Abdominal x-ray 5/24: with progressive small bowel distention improved colonic distention with some concern for developing small bowel obstruction CTAP with contrast 5/25: Diffuse colonic wall thickening consistent with infectious/inflammatory colitis.  Distended fluid-filled loops of small bowel most pronounced in jejunum but no clear transition point  Hypokalemia  Fever C. difficile negative Fever up to 102 this started 5/2     Plan:     Principal Problem:   Hypoxia Active Problems:   Ileus, postoperative (HCC)   Fever, unspecified   Leukocytosis   Hypokalemia   Lasundra Hascall M Courtland [place]  10/17/2023, 9:27 PM

## 2023-10-17 NOTE — Progress Notes (Signed)
 PHARMACY - TOTAL PARENTERAL NUTRITION CONSULT NOTE   Indication: Prolonged ileus  Patient Measurements: Weight: 95.2 kg (209 lb 14.1 oz)  Ht 69 in Body mass index is 30.99 kg/m. Usual Weight: 98.3 kg  Assessment:  [name] who presents with abdominal distention in the setting of prolonged ileus. Patient had surgery on his knee on May 9 and was taking oxycodone  at home for pain. Endorses that intake has declined significantly since his procedure. Prior to procedure he was eating 3 meals a day which consisted of coffee and toast for breakfast, a veggie platter and meat for lunch, and a dinner prepared by his wife. Does not remember intake after surgery very well but states he was eating very little as nothing was appealing to him.   Has been receiving Relistor , Reglan , and neostigmine in combination with SMOG enemas, however has persistent bowel dilation. No bowel sounds present on physical exam. Pharmacy has been consulted to initiate TPN in setting of prolonged ileus and minimal intake.   5/23 pm - unable to use PICC due to placement. TPN not given, D10 @ 78ml/hr overnight. 5/24 - New PICC placed and TPN started at 1000  Pt continues with ileus - multiple episodes of bilious emesis with significant liquid stool outpt. May need repeat CT and surgery consult.  Glucose / Insulin : CBGs 125-164. 18 units of SSI utilized past 24 hours. rSSI and 10 units insulin  in TPN Electrolytes: K remains low at 3.3 (goal >4 with ileus). Pt requiring lots of K - likely due to losses. Mg 2.6, Cl 97 (max Cl in TPN). Renal: Scr <1, BUN 29 Hepatic: LFTs WNL Intake / Output; MIVF: 500 mL stool output (flexiseal), 1750 ml NG output. UOP not recorded accurately. GI Imaging:  5/21 KUB: progressive adynamic ileus 5/23 Abd xray: unchanged diffuse gaseous small bowel distension, likely ileus  5/24 Abd xray: Severely dilated loops of air-filled small bowel throughout the mid abdomen with minimal colonic gas  GI  Surgeries / Procedures: N/A  Central access: PICC (placed 5/21), replaced 5/24 TPN start date: 5/21  Nutritional Goals: Goal TPN rate is 90 mL/hr (provides 121 g of protein and 2200 kcals per day)  RD Assessment: Estimated Needs Total Energy Estimated Needs: 2200-2400 Total Protein Estimated Needs: 110-130 grams Total Fluid Estimated Needs: >/= 2 L  Current Nutrition:  TPN  Plan:  Advance TPN to goal rate of 90 mL/hr at 1800. This will provide 100% of pt's estimated needs. Electrolytes in TPN: Na 101mEq/L, increase K to 80 mEq/L (max), Ca 2mEq/L, decrease Mg to 3 mEq/L, and Phos 24mmol/L. Cl:Ac -- maximize chloride Potassium chloride  10mEq/50ml q1h x 6 Add standard MVI and trace elements to TPN Continue to add thiamine to TPN daily for 7 total days (5/27 stop date) Increase regular insulin  to 16 units daily in TPN - keeping ~same ratio as yesterday with CBGs more well controlled Continue Resistant q4h SSI and adjust as needed  Monitor TPN labs on Mon/Thur - BMET at 1800 and then daily until TPN at goal rate  Enrigue Harvard, PharmD, BCPS Please see amion for complete clinical pharmacist phone list 10/17/2023 7:18 AM

## 2023-10-17 NOTE — Progress Notes (Signed)
 Pharmacy Electrolyte Replacement  Recent Labs:  Recent Labs    10/16/23 1054 10/17/23 0300 10/17/23 1835  K 3.2* 3.3* 3.7  MG 2.0 2.6*  --   PHOS 2.9  --   --   CREATININE 0.99 0.85 0.66    Low Critical Values (K </= 2.5, Phos </= 1, Mg </= 1) Present: K = 3.7   Plan: KCL x4  Ivery Marking, PharmD, BCIDP, AAHIVP, CPP Infectious Disease Pharmacist 10/17/2023 9:10 PM

## 2023-10-17 NOTE — Plan of Care (Signed)

## 2023-10-18 DIAGNOSIS — K567 Ileus, unspecified: Secondary | ICD-10-CM | POA: Diagnosis not present

## 2023-10-18 DIAGNOSIS — K9189 Other postprocedural complications and disorders of digestive system: Secondary | ICD-10-CM | POA: Diagnosis not present

## 2023-10-18 DIAGNOSIS — K529 Noninfective gastroenteritis and colitis, unspecified: Secondary | ICD-10-CM

## 2023-10-18 DIAGNOSIS — R509 Fever, unspecified: Secondary | ICD-10-CM | POA: Diagnosis not present

## 2023-10-18 DIAGNOSIS — R0902 Hypoxemia: Secondary | ICD-10-CM | POA: Diagnosis not present

## 2023-10-18 DIAGNOSIS — D72829 Elevated white blood cell count, unspecified: Secondary | ICD-10-CM

## 2023-10-18 LAB — COMPREHENSIVE METABOLIC PANEL WITH GFR
ALT: 58 U/L — ABNORMAL HIGH (ref 0–44)
AST: 49 U/L — ABNORMAL HIGH (ref 15–41)
Albumin: 2.1 g/dL — ABNORMAL LOW (ref 3.5–5.0)
Alkaline Phosphatase: 85 U/L (ref 38–126)
Anion gap: 8 (ref 5–15)
BUN: 25 mg/dL — ABNORMAL HIGH (ref 8–23)
CO2: 27 mmol/L (ref 22–32)
Calcium: 8.5 mg/dL — ABNORMAL LOW (ref 8.9–10.3)
Chloride: 103 mmol/L (ref 98–111)
Creatinine, Ser: 0.66 mg/dL (ref 0.61–1.24)
GFR, Estimated: >60 mL/min (ref >60)
Glucose, Bld: 203 mg/dL — ABNORMAL HIGH (ref 70–99)
Potassium: 4.1 mmol/L (ref 3.5–5.1)
Sodium: 138 mmol/L (ref 135–145)
Total Bilirubin: 0.7 mg/dL (ref 0.0–1.2)
Total Protein: 5.5 g/dL — ABNORMAL LOW (ref 6.5–8.1)

## 2023-10-18 LAB — GLUCOSE, CAPILLARY
Glucose-Capillary: 126 mg/dL — ABNORMAL HIGH (ref 70–99)
Glucose-Capillary: 135 mg/dL — ABNORMAL HIGH (ref 70–99)
Glucose-Capillary: 146 mg/dL — ABNORMAL HIGH (ref 70–99)
Glucose-Capillary: 164 mg/dL — ABNORMAL HIGH (ref 70–99)
Glucose-Capillary: 188 mg/dL — ABNORMAL HIGH (ref 70–99)
Glucose-Capillary: 216 mg/dL — ABNORMAL HIGH (ref 70–99)

## 2023-10-18 LAB — PHOSPHORUS: Phosphorus: 3.8 mg/dL (ref 2.5–4.6)

## 2023-10-18 LAB — CBC
HCT: 29.9 % — ABNORMAL LOW (ref 39.0–52.0)
Hemoglobin: 9.4 g/dL — ABNORMAL LOW (ref 13.0–17.0)
MCH: 29.5 pg (ref 26.0–34.0)
MCHC: 31.4 g/dL (ref 30.0–36.0)
MCV: 93.7 fL (ref 80.0–100.0)
Platelets: 410 10*3/uL — ABNORMAL HIGH (ref 150–400)
RBC: 3.19 MIL/uL — ABNORMAL LOW (ref 4.22–5.81)
RDW: 15.4 % (ref 11.5–15.5)
WBC: 7.3 10*3/uL (ref 4.0–10.5)
nRBC: 0 % (ref 0.0–0.2)

## 2023-10-18 LAB — MAGNESIUM: Magnesium: 2.4 mg/dL (ref 1.7–2.4)

## 2023-10-18 LAB — TRIGLYCERIDES: Triglycerides: 186 mg/dL — ABNORMAL HIGH (ref <150)

## 2023-10-18 MED ORDER — PIPERACILLIN-TAZOBACTAM 3.375 G IVPB
3.3750 g | Freq: Three times a day (TID) | INTRAVENOUS | Status: DC
  Administered 2023-10-18 – 2023-10-21 (×11): 3.375 g via INTRAVENOUS
  Filled 2023-10-18 (×11): qty 50

## 2023-10-18 MED ORDER — METOCLOPRAMIDE HCL 5 MG/ML IJ SOLN
10.0000 mg | Freq: Three times a day (TID) | INTRAMUSCULAR | Status: DC
  Administered 2023-10-18 – 2023-10-21 (×10): 10 mg via INTRAVENOUS
  Filled 2023-10-18 (×10): qty 2

## 2023-10-18 MED ORDER — TRAVASOL 10 % IV SOLN
INTRAVENOUS | Status: AC
  Filled 2023-10-18: qty 1209.6

## 2023-10-18 NOTE — Plan of Care (Signed)

## 2023-10-18 NOTE — Progress Notes (Addendum)
 PROGRESS NOTE    Chase Combs  WUJ:[id number] DOB: 03-24-1946 DOA: 10/09/2023 PCP: Ilsa Maltese, PA   Brief Narrative:    78 y/o make with PMH significant for Left TKR on 10/01/2023 for osteoarthritis, CAD S/p MI s/p coronary stent x 2, who presented to [facility] with c/o feeling "unwell" x 1 week since his TKR.  Patient noted to have distended abdomen, Tachypnic and Tachycardiac upon arrival to ED and he was hypoxic.  A CT abdomen/pelvis and CTA for Pulmonary emboli were done.  He was noted to have frequent PVCs and possible short runs of V Tach and Amiodarone  drip was started.  CT scan abd-no definitive obstruction and CTA no Pulmonary emboli.  Patient noted to have abdominal distension and hypoxia post op as well during several days of recovery with one day where he had some vomiting and PT was canceled.  He was admitted for acute hypoxemic respiratory failure secondary type of ventilation due to markedly distended abdomen related to opiate induced ileus.  PICC line placed 5/24 and patient remains on TPN.  He is currently frustrated with his lack of progress.  CT abdomen performed 5/25 with diffuse small bowel dilation without transition point and some concern for right lung aspiration.  Leukocytosis improving and GI following.  Assessment & Plan:   Principal Problem:   Hypoxia Active Problems:   Ileus, postoperative (HCC)   Fever, unspecified   Leukocytosis   Hypokalemia  Assessment and Plan:   Acute hypoxemic respiratory failure: Resolved, but now with concerns for aspiration pneumonia Likely in the setting of hypoventilation from markedly distended abdomen.  Currently on room air, denies having any shortness of breath -Findings of aspiration noted on CT performed 5/25 -Started on Zosyn  empirically   Opioid Induced Ileus vs SBO Risk of Refeeding Syndrome Possible colitis Driving factor behind all of his symptoms currently. - NG tube placed two days ago still  having a lot of output, as well as liquid stools.  Patient has a fecal management system.   - Abdominal distention getting better, having more bowel sounds, Liver still shows quite distended bowel.   - GI following, advised to continue Relistor  and Reglan   - Patient receiving nutrition through TPN, PICC line replaced 5/24 -Appreciate ongoing GI recommendations with noted colonic wall thickening for which a Zosyn  will assist with management and has been started 5/26 -C diff negative as well as GI panel negative -Continue NG tube to LIS as well as rectal tube -Encourage activity and aggressive PT -Avoid narcotics     CAD s/p CABG: Stable no symptoms, IV Lopressor  initiated Frequent PVCs and NSVT HTN: Blood pressure elevated - Patient n.p.o. unable to take p.o. hypertension I started the patient on IV scheduled Lopressor  q6h Once patient is able to tolerate p.o. can initiate p.o. beta-blocker.   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   Consultants:  GI  Procedures:  None  Antimicrobials:  Anti-infectives (From admission, onward)    Start     Dose/Rate Route Frequency Ordered Stop   10/18/23 0915  piperacillin -tazobactam (ZOSYN ) IVPB 3.375 g        3.375 g 12.5 mL/hr over 240 Minutes Intravenous Every 8 hours 10/18/23 0819     10/10/23 0600  piperacillin -tazobactam (ZOSYN ) IVPB 3.375 g  Status:  Discontinued        3.375  g 12.5 mL/hr over 240 Minutes Intravenous Every 8 hours 10/10/23 0445 10/11/23 0951   10/09/23 2345  vancomycin  (VANCOCIN ) 500 mg in sodium chloride  0.9 % 100 mL IVPB       Placed in "And" Linked Group   500 mg 100 mL/hr over 60 Minutes Intravenous  Once 10/09/23 2231 10/10/23 0103   10/09/23 2245  vancomycin  (VANCOCIN ) IVPB 1000 mg/200 mL premix       Placed in "And" Linked Group    1,000 mg 200 mL/hr over 60 Minutes Intravenous  Once 10/09/23 2231 10/09/23 2359   10/09/23 2215  vancomycin  (VANCOCIN ) IVPB 1000 mg/200 mL premix  Status:  Discontinued        1,000 mg 200 mL/hr over 60 Minutes Intravenous  Once 10/09/23 2212 10/09/23 2227   10/09/23 2215  piperacillin -tazobactam (ZOSYN ) IVPB 3.375 g        3.375 g 100 mL/hr over 30 Minutes Intravenous  Once 10/09/23 2212 10/09/23 2330       Subjective: Patient seen and evaluated today with no new acute complaints or concerns, but overall feels frustrated with his lack of progress.  He is overall tired and weak.  Continues to have high NG tube output but is improved from the day prior.  Stool output is significant as well.  CT performed 5/25 with diffuse mild dilation right lung findings suggestive of aspiration.  Objective: Vitals:   10/18/23 0417 10/18/23 0448 10/18/23 0500 10/18/23 0757  BP: 116/88 105/63  129/87  Pulse: 91 91  89  Resp: (!) 21 (!) 22  17  Temp: 98.6 F (37 C) (!) 97.4 F (36.3 C)  (!) 97.4 F (36.3 C)  TempSrc: Oral Oral  Oral  SpO2: 93% 100%  98%  Weight:   93 kg     Intake/Output Summary (Last 24 hours) at 10/18/2023 1009 Last data filed at 10/18/2023 0439 Gross per 24 hour  Intake 2177.37 ml  Output 2690 ml  Net -512.63 ml   Filed Weights   10/16/23 0500 10/17/23 0500 10/18/23 0500  Weight: 95.3 kg 95.2 kg 93 kg    Examination:  General exam: Appears mildly agitated/uncomfortable Respiratory system: Decreased to auscultation, nasal cannula present Cardiovascular system: S1 & S2 heard, RRR.  Gastrointestinal system: Abdomen is soft.  NG tube to LIS Central nervous system: Alert and awake Extremities: No edema Skin: No significant lesions noted Psychiatry: Flat affect. Rectal tube with high liquid stool output.    Data Reviewed: I have personally reviewed following labs and imaging studies  CBC: Recent Labs  Lab 10/15/23 2149 10/16/23 1054 10/17/23 0300  10/18/23 0510  WBC 10.2 16.5* 12.6* 7.3  HGB 10.2* 10.1* 9.7* 9.4*  HCT 31.2* 30.3* 29.8* 29.9*  MCV 91.5 89.9 91.7 93.7  PLT 520* 473* 443* 410*   Basic Metabolic Panel: Recent Labs  Lab 10/14/23 0323 10/14/23 1700 10/15/23 0611 10/16/23 1054 10/17/23 0300 10/17/23 1835 10/18/23 0510  NA 138 140 140 138 138 140 138  K 2.8* 3.3* 3.2* 3.2* 3.3* 3.7 4.1  CL 93* 96* 99 97* 97* 101 103  CO2 33* 34* 32 31 33* 30 27  GLUCOSE 228* 201* 238* 165* 164* 124* 203*  BUN 36* 29* 22 21 29* 26* 25*  CREATININE 0.84 0.70 0.68 0.99 0.85 0.66 0.66  CALCIUM  8.3* 8.3* 8.3* 8.1* 8.4* 8.4* 8.5*  MG 2.4 2.4 2.5* 2.0 2.6*  --  2.4  PHOS 2.3* 2.8 2.7 2.9  --   --  3.8   GFR:  Estimated Creatinine Clearance: 89.8 mL/min (by C-G formula based on SCr of 0.66 mg/dL). Liver Function Tests: Recent Labs  Lab 10/14/23 0323 10/14/23 1700 10/15/23 0611 10/18/23 0510  AST 25  --   --  49*  ALT 34  --   --  58*  ALKPHOS 71  --   --  85  BILITOT 0.9  --   --  0.7  PROT 5.1*  --   --  5.5*  ALBUMIN 2.1* 2.1* 2.1* 2.1*   No results for input(s): "LIPASE", "AMYLASE" in the last 168 hours. No results for input(s): "AMMONIA" in the last 168 hours. Coagulation Profile: No results for input(s): "INR", "PROTIME" in the last 168 hours.  Cardiac Enzymes: No results for input(s): "CKTOTAL", "CKMB", "CKMBINDEX", "TROPONINI" in the last 168 hours. BNP (last 3 results) No results for input(s): "PROBNP" in the last 8760 hours. HbA1C: No results for input(s): "HGBA1C" in the last 72 hours. CBG: Recent Labs  Lab 10/17/23 1609 10/17/23 1946 10/18/23 0031 10/18/23 0415 10/18/23 0755  GLUCAP 142* 135* 164* 188* 216*   Lipid Profile: Recent Labs    10/18/23 0510  TRIG 186*   Thyroid  Function Tests: No results for input(s): "TSH", "T4TOTAL", "FREET4", "T3FREE", "THYROIDAB" in the last 72 hours. Anemia Panel: No results for input(s): "VITAMINB12", "FOLATE", "FERRITIN", "TIBC", "IRON", "RETICCTPCT" in  the last 72 hours. Sepsis Labs: No results for input(s): "PROCALCITON", "LATICACIDVEN" in the last 168 hours.   Recent Results (from the past 240 hours)  Culture, blood (Routine x 2)     Status: None   Collection Time: 10/09/23  9:42 PM   Specimen: BLOOD  Result Value Ref Range Status   Specimen Description   Final    BLOOD RIGHT ANTECUBITAL Performed at [facility], [address], [place], [place] [address]    Special Requests   Final    BOTTLES DRAWN AEROBIC AND ANAEROBIC Blood Culture adequate volume Performed at [facility], [address] [place], [place] [address]    Culture   Final    NO GROWTH 5 DAYS Performed at [facility], [address] [place], [place] [address]    Report Status 10/15/2023 FINAL  Final  Resp panel by RT-PCR (RSV, Flu A&B, Covid) Anterior Nasal Swab     Status: None   Collection Time: 10/09/23 10:07 PM   Specimen: Anterior Nasal Swab  Result Value Ref Range Status   SARS Coronavirus 2 by RT PCR NEGATIVE NEGATIVE Final    Comment: (NOTE) SARS-CoV-2 target nucleic acids are NOT DETECTED.  The SARS-CoV-2 RNA is generally detectable in upper respiratory specimens during the acute phase of infection. The lowest concentration of SARS-CoV-2 viral copies this assay can detect is 138 copies/mL. A negative result does not preclude SARS-Cov-2 infection and should not be used as the sole basis for treatment or other patient management decisions. A negative result may occur with  improper specimen collection/handling, submission of specimen other than nasopharyngeal swab, presence of viral mutation(s) within the areas targeted by this assay, and inadequate number of viral copies(<138 copies/mL). A negative result must be combined with clinical observations, patient history, and epidemiological information. The expected result is Negative.  Fact Sheet for Patients:   BloggerCourse.com  Fact Sheet for Healthcare Providers:  SeriousBroker.it  This test is no t yet approved or cleared by the [place]  FDA and  has been authorized for detection and/or diagnosis of SARS-CoV-2 by FDA under an Emergency Use Authorization (EUA). This  EUA will remain  in effect (meaning this test can be used) for the duration of the COVID-19 declaration under Section 564(b)(1) of the Act, 21 U.S.C.section 360bbb-3(b)(1), unless the authorization is terminated  or revoked sooner.       Influenza A by PCR NEGATIVE NEGATIVE Final   Influenza B by PCR NEGATIVE NEGATIVE Final    Comment: (NOTE) The Xpert Xpress SARS-CoV-2/FLU/RSV plus assay is intended as an aid in the diagnosis of influenza from Nasopharyngeal swab specimens and should not be used as a sole basis for treatment. Nasal washings and aspirates are unacceptable for Xpert Xpress SARS-CoV-2/FLU/RSV testing.  Fact Sheet for Patients: BloggerCourse.com  Fact Sheet for Healthcare Providers: SeriousBroker.it  This test is not yet approved or cleared by the [place]  FDA and has been authorized for detection and/or diagnosis of SARS-CoV-2 by FDA under an Emergency Use Authorization (EUA). This EUA will remain in effect (meaning this test can be used) for the duration of the COVID-19 declaration under Section 564(b)(1) of the Act, 21 U.S.C. section 360bbb-3(b)(1), unless the authorization is terminated or revoked.     Resp Syncytial Virus by PCR NEGATIVE NEGATIVE Final    Comment: (NOTE) Fact Sheet for Patients: BloggerCourse.com  Fact Sheet for Healthcare Providers: SeriousBroker.it  This test is not yet approved or cleared by the [place]  FDA and has been authorized for detection and/or diagnosis of SARS-CoV-2 by FDA under an Emergency Use  Authorization (EUA). This EUA will remain in effect (meaning this test can be used) for the duration of the COVID-19 declaration under Section 564(b)(1) of the Act, 21 U.S.C. section 360bbb-3(b)(1), unless the authorization is terminated or revoked.  Performed at [facility], [address], [place], [place] [address]   Culture, blood (Routine X 2) w Reflex to ID Panel     Status: None   Collection Time: 10/09/23 10:20 PM   Specimen: BLOOD LEFT WRIST  Result Value Ref Range Status   Specimen Description   Final    BLOOD LEFT WRIST Performed at [facility], [address], [place], [place] [address]    Special Requests   Final    BOTTLES DRAWN AEROBIC AND ANAEROBIC Blood Culture adequate volume Performed at [facility], [address], [place], [place] [address]    Culture   Final    NO GROWTH 5 DAYS Performed at [facility], [address], [place], [place] [address]    Report Status 10/15/2023 FINAL  Final  MRSA Next Gen by PCR, Nasal     Status: None   Collection Time: 10/10/23  3:37 AM   Specimen: Nasal Mucosa; Nasal Swab  Result Value Ref Range Status   MRSA by PCR Next Gen NOT DETECTED NOT DETECTED Final    Comment: (NOTE) The GeneXpert MRSA Assay (FDA approved for NASAL specimens only), is one component of a comprehensive MRSA colonization surveillance program. It is not intended to diagnose MRSA infection nor to guide or monitor treatment for MRSA infections. Test performance is not FDA approved in patients less than 49 years old. Performed at [facility], [address], [place], [place] [address]   C Difficile Quick Screen w PCR reflex     Status: None   Collection Time: 10/10/23  6:41 AM   Specimen: STOOL  Result Value Ref Range Status   C Diff antigen NEGATIVE NEGATIVE Final   C Diff toxin NEGATIVE NEGATIVE Final   C Diff interpretation No C. difficile  detected.  Final    Comment: Performed at  [facility], [address] [place], [place] [address]  Gastrointestinal Panel by PCR , Stool     Status: None   Collection Time: 10/10/23  6:41 AM   Specimen: STOOL  Result Value Ref Range Status   Campylobacter species NOT DETECTED NOT DETECTED Final   Plesimonas shigelloides NOT DETECTED NOT DETECTED Final   Salmonella species NOT DETECTED NOT DETECTED Final   Yersinia enterocolitica NOT DETECTED NOT DETECTED Final   Vibrio species NOT DETECTED NOT DETECTED Final   Vibrio cholerae NOT DETECTED NOT DETECTED Final   Enteroaggregative E coli (EAEC) NOT DETECTED NOT DETECTED Final   Enteropathogenic E coli (EPEC) NOT DETECTED NOT DETECTED Final   Enterotoxigenic E coli (ETEC) NOT DETECTED NOT DETECTED Final   Shiga like toxin producing E coli (STEC) NOT DETECTED NOT DETECTED Final   Shigella/Enteroinvasive E coli (EIEC) NOT DETECTED NOT DETECTED Final   Cryptosporidium NOT DETECTED NOT DETECTED Final   Cyclospora cayetanensis NOT DETECTED NOT DETECTED Final   Entamoeba histolytica NOT DETECTED NOT DETECTED Final   Giardia lamblia NOT DETECTED NOT DETECTED Final   Adenovirus F40/41 NOT DETECTED NOT DETECTED Final   Astrovirus NOT DETECTED NOT DETECTED Final   Norovirus GI/GII NOT DETECTED NOT DETECTED Final   Rotavirus A NOT DETECTED NOT DETECTED Final   Sapovirus (I, II, IV, and V) NOT DETECTED NOT DETECTED Final    Comment: Performed at [facility], [address] [place], [place] [address]  C Difficile Quick Screen (NO PCR Reflex)     Status: None   Collection Time: 10/16/23 12:31 PM   Specimen: STOOL  Result Value Ref Range Status   C Diff antigen NEGATIVE NEGATIVE Final   C Diff toxin NEGATIVE NEGATIVE Final   C Diff interpretation No C. difficile detected.  Final    Comment: Performed at [facility], [address] [place], [place] [address]         Radiology Studies: CT ABDOMEN PELVIS W CONTRAST Result Date: 10/17/2023 CLINICAL DATA:   Prolonged ileus, abnormal x-ray EXAM: CT ABDOMEN AND PELVIS WITH CONTRAST TECHNIQUE: Multidetector CT imaging of the abdomen and pelvis was performed using the standard protocol following bolus administration of intravenous contrast. RADIATION DOSE REDUCTION: This exam was performed according to the departmental dose-optimization program which includes automated exposure control, adjustment of the mA and/or kV according to patient size and/or use of iterative reconstruction technique. CONTRAST:  75mL ISOVUE-370 IOPAMIDOL (ISOVUE-370) INJECTION 76% COMPARISON:  10/09/2023, 10/06/2023 FINDINGS: Lower chest: There is patchy airspace disease seen throughout the right lung, new since prior study, consistent with infection or aspiration. Areas of linear consolidation within the inferior dependent lower lobes bilaterally may reflect superimposed atelectasis. No effusion or pneumothorax. Central venous catheter tip at the atriocaval junction. Hepatobiliary: No focal liver abnormality is seen. No gallstones, gallbladder wall thickening, or biliary dilatation. Pancreas: Unremarkable. No pancreatic ductal dilatation or surrounding inflammatory changes. Spleen: Normal in size without focal abnormality. Adrenals/Urinary Tract: Stable appearance of the kidneys. No urinary tract calculi or obstructive uropathy. The adrenals and bladder are stable. Stomach/Bowel: Distended fluid-filled loops of small bowel are identified, measuring up to 4.2 cm within the mid jejunum. Gas fluid levels are seen throughout the distended small bowel. No evidence of abrupt transition. The colon is decompressed, with diffuse colonic wall thickening compatible with inflammatory or infectious colitis. This has developed in the interim since prior study. Superimposed diverticulosis of the  descending and sigmoid colon. Rectal tube is in place. Enteric catheter tip within the lumen of the gastric antrum. Vascular/Lymphatic: Aortic atherosclerosis. No  enlarged abdominal or pelvic lymph nodes. Reproductive: Stable enlargement of the prostate. Other: No free fluid or free intraperitoneal gas. Small fat containing umbilical hernia. Musculoskeletal: No acute or destructive bony abnormalities. Reconstructed images demonstrate no additional findings. IMPRESSION: 1. Diffuse colonic wall thickening which has developed since the prior study, consistent with inflammatory or infectious colitis. 2. Distended fluid-filled loops of small bowel with multiple gas fluid levels, most pronounced within the jejunum. There is no evidence of abrupt transition point to suggest obstruction, and findings could reflect sequela of ileus or diarrheal illness. 3. Interval development of multifocal right-sided airspace disease consistent with aspiration or infection. 4. Stable fat containing umbilical hernia. 5. Enlarged prostate. 6. Stable distal colonic diverticulosis. 7.  Aortic Atherosclerosis (ICD10-I70.0). Electronically Signed   By: Bobbye Burrow M.D.   On: 10/17/2023 20:10   DG Abd 1 View Result Date: 10/16/2023 CLINICAL DATA:  NG tube placement. EXAM: ABDOMEN - 1 VIEW COMPARISON:  10/16/2023. FINDINGS: Multiple loops of distended small bowel are noted in the upper abdomen measuring up to 5 cm. An enteric tube terminates in the stomach. Atelectasis is seen at the lung bases bilaterally. IMPRESSION: 1. NG tube terminates in the stomach. 2. Distended loops of small bowel in the upper abdomen measuring up to 5 cm, improved the prior exam. Electronically Signed   By: Wyvonnia Heimlich M.D.   On: 10/16/2023 16:53   DG Abd 1 View Result Date: 10/16/2023 CLINICAL DATA:  Ileus EXAM: ABDOMEN-supine views 4 COMPARISON:  X-ray 10/15/2023. FINDINGS: Enteric tube along the stomach. The stomach is distended with air. There are several dilated loops of small bowel throughout the mid abdomen again measuring up to 6.6 cm today. Minimal colonic gas. Surgical changes in the pelvis. No obvious free  air on the supine radiographs. Multiple overlapping artifacts. Degenerative changes. IMPRESSION: Severely dilated loops of air-filled small bowel throughout the mid abdomen with minimal colonic gas. Please correlate for developing obstruction. Please correlate with specific clinical presentation and if needed repeat CT scan is recommended to further delineate and confirm etiology. Electronically Signed   By: Adrianna Horde M.D.   On: 10/16/2023 13:47        Scheduled Meds:  Chlorhexidine  Gluconate Cloth  6 each Topical Daily   heparin   5,000 Units Subcutaneous Q8H   insulin  aspart  0-20 Units Subcutaneous Q4H   lidocaine   1 patch Transdermal Q24H   metoCLOPramide  (REGLAN ) injection  10 mg Intravenous Q8H   metoprolol  tartrate  5 mg Intravenous Q6H   ondansetron  (ZOFRAN ) IV  4 mg Intravenous Once   sodium chloride  flush  10-40 mL Intracatheter Q12H   traMADol   50 mg Oral Once   Continuous Infusions:  piperacillin -tazobactam (ZOSYN )  IV 3.375 g (10/18/23 0925)   TPN ADULT (ION) 90 mL/hr at 10/18/23 0439   TPN ADULT (ION)       LOS: 8 days    Time spent: 55 minutes    Markesia Crilly Loran Rock, DO [facility]  If 7PM-7AM, please contact night-coverage [email or web address] 10/18/2023, 10:09 AM

## 2023-10-18 NOTE — Progress Notes (Incomplete)
 PHARMACY - TOTAL PARENTERAL NUTRITION CONSULT NOTE   Indication: Prolonged ileus  Patient Measurements: Weight: 93 kg (205 lb 1.6 oz)  Ht 69 in Body mass index is 30.29 kg/m. Usual Weight: 98.3 kg  Assessment:  [name] who presents with abdominal distention in the setting of prolonged ileus. Patient had surgery on his knee on May 9 and was taking oxycodone  at home for pain. Endorses that intake has declined significantly since his procedure. Prior to procedure he was eating 3 meals a day which consisted of coffee and toast for breakfast, a veggie platter and meat for lunch, and a dinner prepared by his wife. Does not remember intake after surgery very well but states he was eating very little as nothing was appealing to him.   Has been receiving Relistor , Reglan , and neostigmine in combination with SMOG enemas, however has persistent bowel dilation. No bowel sounds present on physical exam. Pharmacy has been consulted to initiate TPN in setting of prolonged ileus and minimal intake.   5/23 pm - unable to use PICC due to placement. TPN not given, D10 @ 15ml/hr overnight. 5/24 - New PICC placed and TPN started at 1000  Pt continues with ileus - multiple episodes of bilious emesis with significant liquid stool outpt. May need repeat CT and surgery consult.  Glucose / Insulin : CBGs 126-216. 23 units of SSI utilized past 24 hours. rSSI and 24 units insulin  in TPN Electrolytes: K 4.8 (goal >4 with ileus). Pt requiring lots of K - likely due to losses. Mg 2.2 (Goal >2 w/ ileus), Cl 108 (max Cl in TPN), CoCa 10.44 Renal: Scr <1, BUN 23 Hepatic: AlkPhos / Tbili wnl; ALT/AST 99/78. TG 186, albumin 2.2 Intake / Output; MIVF: 1600 mL stool output (flexiseal), 45 mL FMS, 1300 ml NG output. UOP 1+ charted GI Imaging:  5/21 KUB: progressive adynamic ileus 5/23 Abd xray: unchanged diffuse gaseous small bowel distension, likely ileus  5/24 Abd xray: Severely dilated loops of air-filled small bowel  throughout the mid abdomen with minimal colonic gas 5/25 CT Abd: distended fluid-filled loops of small bowel w/ multiple gas fluid levels, most pronounced within jejunum. No abrupt transition points suggestive of obstruction- findings could reflect sequela of ileus or diarrheal illness.   GI Surgeries / Procedures: N/A  Central access: PICC (placed 5/21), replaced 5/24 TPN start date: 5/21  Nutritional Goals: Goal TPN rate is 90 mL/hr (provides 121 g of protein and 2200 kcals per day)  RD Assessment: Estimated Needs Total Energy Estimated Needs: 2200-2400 Total Protein Estimated Needs: 110-130 grams Total Fluid Estimated Needs: >/= 2 L  Current Nutrition:  TPN  Plan:  Continue TPN at goal rate of 90 mL/hr at 1800. This will provide 100% of pt's estimated needs. Electrolytes in TPN: Na 58mEq/L, K 75 mEq/L, Ca 72mEq/L, Mg 3 mEq/L, and Phos 31mmol/L. Cl:Ac 2:1 Add standard MVI and trace elements to TPN Thiamine 100 mg iv (last addition today) Increase regular insulin  to 30 units daily in TPN Continue Resistant q4h SSI and adjust as needed  Bmet AM labs  Monitor TPN labs on Mon/Thur - BMET at 1800 and then daily until TPN at goal rate   Marleta Simmer BS, PharmD, BCPS Clinical Pharmacist 10/19/2023 7:59 AM  Contact: [phone] after 3 PM  "Be curious, not judgmental..." -Rumalda Counter

## 2023-10-18 NOTE — Progress Notes (Signed)
 Occupational Therapy Treatment Patient Details Name: Chase Combs MRN: [id number] DOB: Nov 19, 1945 Today's Date: 10/18/2023   History of present illness 78 yo male presented to [facility] 10/09/23 for N/V and abdominal distension. Pt with ileus, short runs of Vtach, and hypoxic respiratory failure.  10/10/23 transfer to [facility]. PICC placed 5/21. Pt required NG tube placement. PMhx: 10/01/23 Lt TKA, CAD, coronary stent, obesity, HTN, prostate CA, OSA   OT comments  Pt making slow progression toward goals this session, able to sit EOB x15 min for grooming task and UB bathing. Pt with SpO2 92-94% on RA, but requesting supplemental O2 for comfort. Pt able to briefly stand and take few side steps toward [facility] before returning to supine. Pt presenting with impairments listed below, will follow acutely. Recommend HHOT at d/c given pt has support at home and progresses with mobility/ADLs.       If plan is discharge home, recommend the following:  Assistance with cooking/housework;Direct supervision/assist for medications management;Assist for transportation;Help with stairs or ramp for entrance;Direct supervision/assist for financial management;A lot of help with bathing/dressing/bathroom;A lot of help with walking and/or transfers   Equipment Recommendations  BSC/3in1    Recommendations for Other Services      Precautions / Restrictions Precautions Precautions: Fall;Knee;Other (comment) Precaution Booklet Issued: No Recall of Precautions/Restrictions: Intact Precaution/Restrictions Comments: NG tube, flexiseal, PICC Restrictions Weight Bearing Restrictions Per Provider Order: Yes RLE Weight Bearing Per Provider Order: Weight bearing as tolerated LLE Weight Bearing Per Provider Order: Weight bearing as tolerated Other Position/Activity Restrictions: no pillow or bolster under bend of knee at rest, OK to have under ankle to promote L knee extension       Mobility Bed Mobility Overal bed  mobility: Needs Assistance Bed Mobility: Supine to Sit     Supine to sit: Min assist Sit to supine: Supervision   General bed mobility comments: min A for trunk elevation    Transfers Overall transfer level: Needs assistance   Transfers: Sit to/from Stand Sit to Stand: Contact guard assist           General transfer comment: squats to stand/step toward [facility]     Balance Overall balance assessment: Needs assistance Sitting-balance support: No upper extremity supported Sitting balance-Leahy Scale: Good     Standing balance support: Bilateral upper extremity supported, During functional activity, Reliant on assistive device for balance Standing balance-Leahy Scale: Fair                             ADL either performed or assessed with clinical judgement   ADL Overall ADL's : Needs assistance/impaired Eating/Feeding: NPO   Grooming: Set up;Sitting;Oral care;Wash/dry face   Upper Body Bathing: Moderate assistance Upper Body Bathing Details (indicate cue type and reason): washing and applying lotion to back             Toilet Transfer: Contact guard assist Toilet Transfer Details (indicate cue type and reason): stands briefly to lateral step toward [facility] Toileting- Clothing Manipulation and Hygiene: Total assistance Toileting - Clothing Manipulation Details (indicate cue type and reason): flexi seal            Extremity/Trunk Assessment Upper Extremity Assessment Upper Extremity Assessment: Generalized weakness RUE Deficits / Details: hx of R soulder injury, shoudler flexion 70 deg roughly. other joints WFL> RUE Coordination: decreased gross motor            Vision   Vision Assessment?: No apparent visual deficits  Perception Perception Perception: Not tested   Praxis Praxis Praxis: Not tested   Communication Communication Communication: No apparent difficulties   Cognition Arousal: Alert Behavior During Therapy: WFL for tasks  assessed/performed Cognition: No apparent impairments                               Following commands: Intact        Cueing   Cueing Techniques: Verbal cues  Exercises      Shoulder Instructions       General Comments SpO2 92-94% on RA, pt requesting 3L O2 be donned    Pertinent Vitals/ Pain       Pain Assessment Pain Assessment: Faces Pain Score: 3  Faces Pain Scale: Hurts little more Pain Location: abdomen at rest Pain Descriptors / Indicators: Discomfort, Grimacing, Sore Pain Intervention(s): Limited activity within patient's tolerance, Monitored during session, Repositioned  Home Living                                          Prior Functioning/Environment              Frequency  Min 2X/week        Progress Toward Goals  OT Goals(current goals can now be found in the care plan section)  Progress towards OT goals: Progressing toward goals  Acute Rehab OT Goals Patient Stated Goal: none stated OT Goal Formulation: With patient Time For Goal Achievement: 10/30/23 Potential to Achieve Goals: Fair ADL Goals Pt Will Perform Grooming: with set-up;standing Pt Will Perform Upper Body Dressing: with min assist;sitting Pt Will Perform Lower Body Dressing: with min assist;sitting/lateral leans;sit to/from stand Pt Will Transfer to Toilet: with min assist;ambulating;regular height toilet Pt Will Perform Toileting - Clothing Manipulation and hygiene: with supervision;sitting/lateral leans Pt/caregiver will Perform Home Exercise Program: Increased strength;Both right and left upper extremity;With theraband;Independently;With written HEP provided Additional ADL Goal #1: Pt will demonstrate understanding of 2 pressure relief strategies to reduce onset of wounds Additional ADL Goal #2: Pt will complete bed mobility assessement to determine capacity for EOB/OOB activity  Plan      Co-evaluation                 AM-PAC OT "6  Clicks" Daily Activity     Outcome Measure   Help from another person eating meals?: Total (has mobility but is NPO) Help from another person taking care of personal grooming?: A Little Help from another person toileting, which includes using toliet, bedpan, or urinal?: A Lot Help from another person bathing (including washing, rinsing, drying)?: A Lot Help from another person to put on and taking off regular upper body clothing?: A Little Help from another person to put on and taking off regular lower body clothing?: A Lot 6 Click Score: 13    End of Session Equipment Utilized During Treatment: Oxygen  OT Visit Diagnosis: Muscle weakness (generalized) (M62.81);Other abnormalities of gait and mobility (R26.89);Pain Pain - Right/Left: Left Pain - part of body: Knee   Activity Tolerance Patient limited by fatigue;Patient limited by pain   Patient Left in bed;with call bell/phone within reach;with bed alarm set;with nursing/sitter in room   Nurse Communication Mobility status        Time: 9528-4132 OT Time Calculation (min): 33 min  Charges: OT General Charges $OT Visit: 1 Visit OT Treatments $Self  Care/Home Management : 23-37 mins  Adilen Pavelko K, OTD, OTR/L SecureChat Preferred Acute Rehab [phone]   Antionette Kirks 10/18/2023, 9:32 AM

## 2023-10-18 NOTE — Plan of Care (Signed)
   Problem: Health Behavior/Discharge Planning: Goal: Ability to manage health-related needs will improve Outcome: Progressing

## 2023-10-18 NOTE — Progress Notes (Addendum)
 Pharmacy Antibiotic Note  Chase Combs is a 78 y.o. male admitted on 10/09/2023 with abdominal distention in the setting of prolonged ileus. Now with suspected aspiration pneumonia and colitis. Pharmacy has been consulted for Zosyn  dosing.  Scr 0.66 stable (eCrCl 90) WBC 7.3 WNL, afebrile, vitals WNL Stable on 3L nasal cannula  5/25 CT abdomen consistent with colitis  Plan: Zosyn  3.375g IV q8h (4 hour infusion) Monitor cultures, CBC, renal function, and signs of clinical improvement Follow up for length of treatment and/or de escalation plans  Weight: 93 kg (205 lb 1.6 oz)  Temp (24hrs), Avg:97.9 F (36.6 C), Min:97.4 F (36.3 C), Max:98.6 F (37 C)  Recent Labs  Lab 10/11/23 0847 10/11/23 1600 10/15/23 0611 10/15/23 2149 10/16/23 1054 10/17/23 0300 10/17/23 1835 10/18/23 0510  WBC 3.5*  --   --  10.2 16.5* 12.6*  --  7.3  CREATININE 1.12   < > 0.68  --  0.99 0.85 0.66 0.66   < > = values in this interval not displayed.    Estimated Creatinine Clearance: 89.8 mL/min (by C-G formula based on SCr of 0.66 mg/dL).    Allergies  Allergen Reactions   Codeine Nausea And Vomiting   Latex Rash    "Oral blisters when used at dentist"    Antimicrobials this admission: 5/26 Zosyn  >>   Microbiology results: 5/17 BCx: negative 5/18 CDIF screen negative 5/18 GI panel negative 5/18 MRSA PCR: negative 5/17 RVP negative  Thank you for allowing pharmacy to be a part of this patient's care.  Jerrel Mor, PharmD PGY1 Pharmacy Resident 10/18/2023 8:09 AM

## 2023-10-18 NOTE — Progress Notes (Signed)
 [facility] ROUNDING NOTE   Subjective: Patient feels about the same today.  Very frustrated with his lack of progress.  Feels very tired and weak.  Unable to sleep at night, frustrated by interruptions and staff leaving lights on in the middle of the night.  NG tube output 1 L, improved from 2 L the day before.  Stool output continues to be significant, with 1600 cc yesterday. CT scan showed diffuse small bowel dilation without transition point.  Numerous air-fluid levels.  No colonic dilation, but diffuse colon wall thickening noted.  Patchy airspace disease in the right lung, suggestive of aspiration.   Repeat C. difficile test negative.  White blood cell count improved to 7, from 16 2 days ago.   Objective: Vital signs in last 24 hours: Temp:  [97.4 F (36.3 C)-98.6 F (37 C)] 97.4 F (36.3 C) (05/26 0757) Pulse Rate:  [89-95] 89 (05/26 0757) Resp:  [17-22] 17 (05/26 0757) BP: (105-133)/(63-88) 129/87 (05/26 0757) SpO2:  [93 %-100 %] 98 % (05/26 0757) Weight:  [93 kg] 93 kg (05/26 0500) Last BM Date : 10/17/23 General: NAD, Caucasian male, appears tired Lungs:  CTA b/l, no w/r/r Heart:  RRR, no m/r/g Abdomen:  Soft, mild diffuse tenderness to palpation, minimally distended, hypoactive but normal bowel sounds appreciated today, new from previous exams Ext:  No c/c/e    Intake/Output from previous day: 05/25 0701 - 05/26 0700 In: 2297.4 [P.O.:120; I.V.:1590.3; IV Piggyback:587.1] Out: 2690 [Urine:100; Emesis/NG output:1000; Stool:1590] Intake/Output this shift: No intake/output data recorded.   Lab Results: Recent Labs    10/16/23 1054 10/17/23 0300 10/18/23 0510  WBC 16.5* 12.6* 7.3  HGB 10.1* 9.7* 9.4*  PLT 473* 443* 410*  MCV 89.9 91.7 93.7   BMET Recent Labs    10/17/23 0300 10/17/23 1835 10/18/23 0510  NA 138 140 138  K 3.3* 3.7 4.1  CL 97* 101 103  CO2 33* 30 27  GLUCOSE 164* 124* 203*  BUN 29* 26* 25*  CREATININE 0.85 0.66 0.66   CALCIUM  8.4* 8.4* 8.5*   LFT Recent Labs    10/18/23 0510  PROT 5.5*  ALBUMIN 2.1*  AST 49*  ALT 58*  ALKPHOS 85  BILITOT 0.7   PT/INR No results for input(s): "INR" in the last 72 hours.    Imaging/Other results: CT ABDOMEN PELVIS W CONTRAST Result Date: 10/17/2023 CLINICAL DATA:  Prolonged ileus, abnormal x-ray EXAM: CT ABDOMEN AND PELVIS WITH CONTRAST TECHNIQUE: Multidetector CT imaging of the abdomen and pelvis was performed using the standard protocol following bolus administration of intravenous contrast. RADIATION DOSE REDUCTION: This exam was performed according to the departmental dose-optimization program which includes automated exposure control, adjustment of the mA and/or kV according to patient size and/or use of iterative reconstruction technique. CONTRAST:  75mL ISOVUE-370 IOPAMIDOL (ISOVUE-370) INJECTION 76% COMPARISON:  10/09/2023, 10/06/2023 FINDINGS: Lower chest: There is patchy airspace disease seen throughout the right lung, new since prior study, consistent with infection or aspiration. Areas of linear consolidation within the inferior dependent lower lobes bilaterally may reflect superimposed atelectasis. No effusion or pneumothorax. Central venous catheter tip at the atriocaval junction. Hepatobiliary: No focal liver abnormality is seen. No gallstones, gallbladder wall thickening, or biliary dilatation. Pancreas: Unremarkable. No pancreatic ductal dilatation or surrounding inflammatory changes. Spleen: Normal in size without focal abnormality. Adrenals/Urinary Tract: Stable appearance of the kidneys. No urinary tract calculi or obstructive uropathy. The adrenals and bladder are stable. Stomach/Bowel: Distended fluid-filled loops of small bowel are identified, measuring up  to 4.2 cm within the mid jejunum. Gas fluid levels are seen throughout the distended small bowel. No evidence of abrupt transition. The colon is decompressed, with diffuse colonic wall thickening  compatible with inflammatory or infectious colitis. This has developed in the interim since prior study. Superimposed diverticulosis of the descending and sigmoid colon. Rectal tube is in place. Enteric catheter tip within the lumen of the gastric antrum. Vascular/Lymphatic: Aortic atherosclerosis. No enlarged abdominal or pelvic lymph nodes. Reproductive: Stable enlargement of the prostate. Other: No free fluid or free intraperitoneal gas. Small fat containing umbilical hernia. Musculoskeletal: No acute or destructive bony abnormalities. Reconstructed images demonstrate no additional findings. IMPRESSION: 1. Diffuse colonic wall thickening which has developed since the prior study, consistent with inflammatory or infectious colitis. 2. Distended fluid-filled loops of small bowel with multiple gas fluid levels, most pronounced within the jejunum. There is no evidence of abrupt transition point to suggest obstruction, and findings could reflect sequela of ileus or diarrheal illness. 3. Interval development of multifocal right-sided airspace disease consistent with aspiration or infection. 4. Stable fat containing umbilical hernia. 5. Enlarged prostate. 6. Stable distal colonic diverticulosis. 7.  Aortic Atherosclerosis (ICD10-I70.0). Electronically Signed   By: Bobbye Burrow M.D.   On: 10/17/2023 20:10   DG Abd 1 View Result Date: 10/16/2023 CLINICAL DATA:  NG tube placement. EXAM: ABDOMEN - 1 VIEW COMPARISON:  10/16/2023. FINDINGS: Multiple loops of distended small bowel are noted in the upper abdomen measuring up to 5 cm. An enteric tube terminates in the stomach. Atelectasis is seen at the lung bases bilaterally. IMPRESSION: 1. NG tube terminates in the stomach. 2. Distended loops of small bowel in the upper abdomen measuring up to 5 cm, improved the prior exam. Electronically Signed   By: Wyvonnia Heimlich M.D.   On: 10/16/2023 16:53   DG Abd 1 View Result Date: 10/16/2023 CLINICAL DATA:  Ileus EXAM:  ABDOMEN-supine views 4 COMPARISON:  X-ray 10/15/2023. FINDINGS: Enteric tube along the stomach. The stomach is distended with air. There are several dilated loops of small bowel throughout the mid abdomen again measuring up to 6.6 cm today. Minimal colonic gas. Surgical changes in the pelvis. No obvious free air on the supine radiographs. Multiple overlapping artifacts. Degenerative changes. IMPRESSION: Severely dilated loops of air-filled small bowel throughout the mid abdomen with minimal colonic gas. Please correlate for developing obstruction. Please correlate with specific clinical presentation and if needed repeat CT scan is recommended to further delineate and confirm etiology. Electronically Signed   By: Adrianna Horde M.D.   On: 10/16/2023 13:47   DG CHEST PORT 1 VIEW Result Date: 10/16/2023 CLINICAL DATA:  Fever. EXAM: PORTABLE CHEST 1 VIEW COMPARISON:  Chest radiograph yesterday FINDINGS: Stable positioning of left upper extremity PICC which is looped in the region of the SVC azygous confluence. Enteric tube remains in place. Stable heart size and mediastinal contours. Stable appearance of bibasilar atelectasis. No progressive airspace disease. No pneumothorax. Normal pulmonary vasculature. IMPRESSION: 1. Stable positioning of left upper extremity PICC which is looped in the region of the SVC azygous confluence. Repositioning is again recommended. 2. Stable bibasilar atelectasis. Electronically Signed   By: Chadwick Colonel M.D.   On: 10/16/2023 11:31      Assessment and Plan:  78 year old white male status post left knee replacement 10/01/2023. Discharged home but then developed problems with abdominal distention nausea vomiting and admitted 10/10/2023 with severe ileus. Ileus has been refractory to supportive measures thus far which have included bowel rest,  NG tube suction, avoidance of narcotics and sedatives, Relistor , Reglan .  He developed a fever 5/24 with a new leukocytosis chest x-ray was  unrevealing, repeat C. difficile negative, but repeat CT scan 5/25 showed right lung opacities, suggestive of aspiration, as well as new diffuse colon wall thickening suggestive of colitis.  He has had profuse liquid stool output, which has increased over the past few days.  He had a negative GI PCR pathogen panel on admission and 2 negative C. difficile tests  Postop ileus, refractory Abdominal exam today showed somewhat normal bowel sounds, which is new for him and is reassuring.  NG tube output decreased from yesterday - Continue NG tube to intermittent low suction, but consider clamping again tomorrow if NG tube output continues to decrease and abdominal exam with active bowel sounds - Continue ice chips for now - Continue to aggressively replete potassium - Encourage out of bed activity, aggressive PT - Continue TPN - Continue to avoid narcotics, anticholinergics/sedatives - Metoclopramide  order apparently fell off yesterday.  Will restart today  Fever, leukocytosis, improved - CT with findings suggestive of aspiration pneumonia as well as new diffuse colon wall thickening -- Zosyn  started  Colon wall thickening, profuse watery stool output; new from CT on 5/17 -- Unclear etiology,  C diff suspected, but negative PCR -- Negative C Diff and GI PCR panel on admission, repeat C diff 5/24 again negative -- Continue to monitor stool output now that on empiric antibiotics; hopefully will slow down  Dr. Dominic Friendly will take over inpatient [place] [facility] service tomorrow    Elois Hair, MD  10/18/2023, 8:49 AM [facility]

## 2023-10-18 NOTE — Progress Notes (Signed)
 Physical Therapy Treatment Patient Details Name: Chase Combs MRN: [id number] DOB: August 23, 1945 Today's Date: 10/18/2023   History of Present Illness 78 yo male presented to [facility] ED 10/09/23 for N/V and abdominal distension. Pt with ileus, short runs of Vtach, and hypoxic respiratory failure.  10/10/23 transfer to [facility]. PICC placed 5/21. Pt required NG tube placement. PMhx: 10/01/23 Lt TKA, CAD, coronary stent, obesity, HTN, prostate CA, OSA    PT Comments  Pt requires encouragement for all mobility and reports feeling light headed and like he is going to pass out when up. Checked orthostatic BP's and they were negative. After standing bedside for BP's pt declined sitting in recliner or amb. Stressed importance of mobility to get stronger and improvement of GI symptoms. Pt plans to return home with wife but he will need to improve on mobility and activity tolerance for that.     If plan is discharge home, recommend the following: A little help with walking and/or transfers;A little help with bathing/dressing/bathroom;Assist for transportation;Help with stairs or ramp for entrance   Can travel by private vehicle        Equipment Recommendations  None recommended by PT    Recommendations for Other Services       Precautions / Restrictions Precautions Precautions: Fall;Knee;Other (comment) Precaution Booklet Issued: No Recall of Precautions/Restrictions: Intact Precaution/Restrictions Comments: NG tube, flexiseal, PICC Restrictions Weight Bearing Restrictions Per Provider Order: Yes LLE Weight Bearing Per Provider Order: Weight bearing as tolerated Other Position/Activity Restrictions: no pillow or bolster under bend of knee at rest, OK to have under ankle to promote L knee extension     Mobility  Bed Mobility Overal bed mobility: Needs Assistance Bed Mobility: Supine to Sit     Supine to sit: Min assist Sit to supine: Min assist   General bed mobility comments: min A for  trunk elevation and to bring legs back up into bed    Transfers Overall transfer level: Needs assistance Equipment used: Rolling walker (2 wheels) Transfers: Sit to/from Stand Sit to Stand: Min assist           General transfer comment: Assist to power up    Ambulation/Gait               General Gait Details: Pt refused to attempt due to lightheadedness   Stairs             Wheelchair Mobility     Tilt Bed    Modified Rankin (Stroke Patients Only)       Balance Overall balance assessment: Needs assistance Sitting-balance support: No upper extremity supported Sitting balance-Leahy Scale: Good     Standing balance support: Bilateral upper extremity supported, During functional activity, Reliant on assistive device for balance Standing balance-Leahy Scale: Poor Standing balance comment: walker and CGA for static standing                            Communication Communication Communication: No apparent difficulties  Cognition Arousal: Alert Behavior During Therapy: WFL for tasks assessed/performed   PT - Cognitive impairments: Other                       PT - Cognition Comments: Self limiting Following commands: Intact      Cueing Cueing Techniques: Verbal cues  Exercises Total Joint Exercises Ankle Circles/Pumps: AROM, Both, 10 reps, Supine Quad Sets: AROM, Left, 10 reps, Supine Short Arc Quad: AROM, Left, 10  reps, Supine Heel Slides: AROM, Left, 10 reps, Supine Hip ABduction/ADduction: AAROM, Left, 10 reps, Supine Straight Leg Raises: AROM, Left, 10 reps, Supine (cues to tighten quad prior to lift) Long Arc Quad: AROM, Left, 10 reps, Seated Knee Flexion: AROM, Left, 10 reps, Seated Goniometric ROM: 0-95 degrees with assist    General Comments General comments (skin integrity, edema, etc.): VSS on 2L, See assessment for orthostatic BP      Pertinent Vitals/Pain Pain Assessment Pain Assessment: Faces Faces Pain  Scale: Hurts little more Pain Location: abdomen at rest Pain Descriptors / Indicators: Discomfort, Grimacing, Sore    Home Living                          Prior Function            PT Goals (current goals can now be found in the care plan section) Progress towards PT goals: Not progressing toward goals - comment (self limiting)    Frequency    Min 2X/week      PT Plan      Co-evaluation              AM-PAC PT "6 Clicks" Mobility   Outcome Measure  Help needed turning from your back to your side while in a flat bed without using bedrails?: A Little Help needed moving from lying on your back to sitting on the side of a flat bed without using bedrails?: A Little Help needed moving to and from a bed to a chair (including a wheelchair)?: A Little Help needed standing up from a chair using your arms (e.g., wheelchair or bedside chair)?: A Little Help needed to walk in hospital room?: Total Help needed climbing 3-5 steps with a railing? : Total 6 Click Score: 14    End of Session Equipment Utilized During Treatment: Oxygen Activity Tolerance: Patient limited by fatigue;Other (comment) (feels lightheaded) Patient left: in bed;with call bell/phone within reach;with bed alarm set Nurse Communication: Mobility status;Other (comment) (flexiseal not draining into bag) PT Visit Diagnosis: Difficulty in walking, not elsewhere classified (R26.2);Other abnormalities of gait and mobility (R26.89)     Time: 6213-0865 PT Time Calculation (min) (ACUTE ONLY): 24 min  Charges:    $Therapeutic Exercise: 8-22 mins $Therapeutic Activity: 8-22 mins PT General Charges $$ ACUTE PT VISIT: 1 Visit                     [facility] PT Acute Rehabilitation Services Office [phone]    [name] [facility] 10/18/2023, 2:56 PM

## 2023-10-18 NOTE — Progress Notes (Signed)
 PHARMACY - TOTAL PARENTERAL NUTRITION CONSULT NOTE   Indication: Prolonged ileus  Patient Measurements: Weight: 93 kg (205 lb 1.6 oz)  Ht 69 in Body mass index is 30.29 kg/m. Usual Weight: 98.3 kg  Assessment:  [name] who presents with abdominal distention in the setting of prolonged ileus. Patient had surgery on his knee on May 9 and was taking oxycodone  at home for pain. Endorses that intake has declined significantly since his procedure. Prior to procedure he was eating 3 meals a day which consisted of coffee and toast for breakfast, a veggie platter and meat for lunch, and a dinner prepared by his wife. Does not remember intake after surgery very well but states he was eating very little as nothing was appealing to him.   Has been receiving Relistor , Reglan , and neostigmine in combination with SMOG enemas, however has persistent bowel dilation. No bowel sounds present on physical exam. Pharmacy has been consulted to initiate TPN in setting of prolonged ileus and minimal intake.   5/23 pm - unable to use PICC due to placement. TPN not given, D10 @ 66ml/hr overnight. 5/24 - New PICC placed and TPN started at 1000  Pt continues with ileus - multiple episodes of bilious emesis with significant liquid stool outpt. May need repeat CT and surgery consult.  Glucose / Insulin : CBGs 125-164. 20 units of SSI utilized past 24 hours. rSSI and 16 units insulin  in TPN Electrolytes: K 4.1 s/p Kcl x6 yesterday (goal >4 with ileus). Pt requiring lots of K - likely due to losses. Mg 2.4, Cl 103 (max Cl in TPN). Renal: Scr <1, BUN 25 Hepatic: LFTs WNL. TG 186 Intake / Output; MIVF: 1590 mL stool output (flexiseal), 1000 ml NG output. UOP charted GI Imaging:  5/21 KUB: progressive adynamic ileus 5/23 Abd xray: unchanged diffuse gaseous small bowel distension, likely ileus  5/24 Abd xray: Severely dilated loops of air-filled small bowel throughout the mid abdomen with minimal colonic gas  GI  Surgeries / Procedures: N/A  Central access: PICC (placed 5/21), replaced 5/24 TPN start date: 5/21  Nutritional Goals: Goal TPN rate is 90 mL/hr (provides 121 g of protein and 2200 kcals per day)  RD Assessment: Estimated Needs Total Energy Estimated Needs: 2200-2400 Total Protein Estimated Needs: 110-130 grams Total Fluid Estimated Needs: >/= 2 L  Current Nutrition:  TPN  Plan:  Advance TPN to goal rate of 90 mL/hr at 1800. This will provide 100% of pt's estimated needs. Electrolytes in TPN: Na 19mEq/L, K 80 mEq/L (max), Ca 71mEq/L, Mg 3 mEq/L, and Phos 85mmol/L. Cl:Ac -- maximize chloride Add standard MVI and trace elements to TPN Continue to add thiamine to TPN daily for 7 total days (5/27 stop date) Increase regular insulin  to 24 units daily in TPN Continue Resistant q4h SSI and adjust as needed  Monitor TPN labs on Mon/Thur - BMET at 1800 and then daily until TPN at goal rate  Oralee Billow, PharmD, [facility] Clinical Pharmacist 10/18/2023 6:59 AM

## 2023-10-19 DIAGNOSIS — R197 Diarrhea, unspecified: Secondary | ICD-10-CM

## 2023-10-19 DIAGNOSIS — K567 Ileus, unspecified: Secondary | ICD-10-CM | POA: Diagnosis not present

## 2023-10-19 DIAGNOSIS — R933 Abnormal findings on diagnostic imaging of other parts of digestive tract: Secondary | ICD-10-CM | POA: Diagnosis not present

## 2023-10-19 DIAGNOSIS — R0902 Hypoxemia: Secondary | ICD-10-CM | POA: Diagnosis not present

## 2023-10-19 DIAGNOSIS — K9189 Other postprocedural complications and disorders of digestive system: Secondary | ICD-10-CM | POA: Diagnosis not present

## 2023-10-19 LAB — RENAL FUNCTION PANEL
Albumin: 2.1 g/dL — ABNORMAL LOW (ref 3.5–5.0)
Anion gap: 8 (ref 5–15)
BUN: 23 mg/dL (ref 8–23)
CO2: 25 mmol/L (ref 22–32)
Calcium: 8.9 mg/dL (ref 8.9–10.3)
Chloride: 108 mmol/L (ref 98–111)
Creatinine, Ser: 0.7 mg/dL (ref 0.61–1.24)
GFR, Estimated: >60 mL/min (ref >60)
Glucose, Bld: 158 mg/dL — ABNORMAL HIGH (ref 70–99)
Phosphorus: 4.1 mg/dL (ref 2.5–4.6)
Potassium: 4.8 mmol/L (ref 3.5–5.1)
Sodium: 141 mmol/L (ref 135–145)

## 2023-10-19 LAB — GLUCOSE, CAPILLARY
Glucose-Capillary: 137 mg/dL — ABNORMAL HIGH (ref 70–99)
Glucose-Capillary: 143 mg/dL — ABNORMAL HIGH (ref 70–99)
Glucose-Capillary: 147 mg/dL — ABNORMAL HIGH (ref 70–99)
Glucose-Capillary: 148 mg/dL — ABNORMAL HIGH (ref 70–99)
Glucose-Capillary: 151 mg/dL — ABNORMAL HIGH (ref 70–99)
Glucose-Capillary: 155 mg/dL — ABNORMAL HIGH (ref 70–99)
Glucose-Capillary: 158 mg/dL — ABNORMAL HIGH (ref 70–99)

## 2023-10-19 LAB — CBC
HCT: 30.9 % — ABNORMAL LOW (ref 39.0–52.0)
Hemoglobin: 9.7 g/dL — ABNORMAL LOW (ref 13.0–17.0)
MCH: 29.3 pg (ref 26.0–34.0)
MCHC: 31.4 g/dL (ref 30.0–36.0)
MCV: 93.4 fL (ref 80.0–100.0)
Platelets: 427 10*3/uL — ABNORMAL HIGH (ref 150–400)
RBC: 3.31 MIL/uL — ABNORMAL LOW (ref 4.22–5.81)
RDW: 15.6 % — ABNORMAL HIGH (ref 11.5–15.5)
WBC: 7.1 10*3/uL (ref 4.0–10.5)
nRBC: 0.3 % — ABNORMAL HIGH (ref 0.0–0.2)

## 2023-10-19 LAB — COMPREHENSIVE METABOLIC PANEL WITH GFR
ALT: 99 U/L — ABNORMAL HIGH (ref 0–44)
AST: 78 U/L — ABNORMAL HIGH (ref 15–41)
Albumin: 2.2 g/dL — ABNORMAL LOW (ref 3.5–5.0)
Alkaline Phosphatase: 115 U/L (ref 38–126)
Anion gap: 11 (ref 5–15)
BUN: 24 mg/dL — ABNORMAL HIGH (ref 8–23)
CO2: 24 mmol/L (ref 22–32)
Calcium: 9 mg/dL (ref 8.9–10.3)
Chloride: 107 mmol/L (ref 98–111)
Creatinine, Ser: 0.71 mg/dL (ref 0.61–1.24)
GFR, Estimated: >60 mL/min (ref >60)
Glucose, Bld: 158 mg/dL — ABNORMAL HIGH (ref 70–99)
Potassium: 4.7 mmol/L (ref 3.5–5.1)
Sodium: 142 mmol/L (ref 135–145)
Total Bilirubin: 0.6 mg/dL (ref 0.0–1.2)
Total Protein: 5.8 g/dL — ABNORMAL LOW (ref 6.5–8.1)

## 2023-10-19 LAB — MAGNESIUM: Magnesium: 2.2 mg/dL (ref 1.7–2.4)

## 2023-10-19 MED ORDER — TRAVASOL 10 % IV SOLN
INTRAVENOUS | Status: DC
  Filled 2023-10-19: qty 1209.6

## 2023-10-19 MED ORDER — PANTOPRAZOLE SODIUM 40 MG IV SOLR
40.0000 mg | Freq: Every day | INTRAVENOUS | Status: DC
  Administered 2023-10-19 – 2023-10-22 (×4): 40 mg via INTRAVENOUS
  Filled 2023-10-19 (×4): qty 10

## 2023-10-19 MED ORDER — KETOROLAC TROMETHAMINE 15 MG/ML IJ SOLN
15.0000 mg | Freq: Once | INTRAMUSCULAR | Status: AC
  Administered 2023-10-19: 15 mg via INTRAVENOUS
  Filled 2023-10-19: qty 1

## 2023-10-19 NOTE — Progress Notes (Signed)
 Nutrition Follow-up  DOCUMENTATION CODES:   Not applicable  INTERVENTION:   TPN per pharmacy to meet 100% of estimated nutritional needs. Pharmacy to add MVI to TPN Monitor for diet advancement   NUTRITION DIAGNOSIS:   Inadequate oral intake related to inability to eat as evidenced by NPO status.  - Still applicable   GOAL:   Patient will meet greater than or equal to 90% of their needs  - Meeting via TPN   MONITOR:   Weight trends, Labs, I & O's, Diet advancement  REASON FOR ASSESSMENT:   Consult Assessment of nutrition requirement/status, New TPN/TNA  ASSESSMENT:   78 y.o. male presented to the ED with SOB. 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 PMH includes CAD, HTN, OSA, and malignant neoplasm of prostate. Pt admitted with acute respiratory failure, Ileus, nasuea, vomiting, and gastroenteritis.  5/18 - Admitted; diet advanced to clear liquids 5/20 - NPO; NGT - LIWS 5/21 - TPN started 5/23 pm - unable to use PICC due to placement. TPN not given, D10 @ 33ml/hr overnight. 5/24 - New PICC placed and TPN started at 1000 5/26 - CT scan showed diffuse small bowel dilation. Numerous air-fluid levels. No colonic dilation, but diffuse colon wall thickening suggestive of colitis,  Patchy airspace disease in the right lung, suggestive of aspiration.   TPN continues. Pt still with prolonged ileus and profound liquid stool from FMS, 1600 ml yesterday. Negative for GI PCR pathogen panel and 2 negative C. Diffcile tests.  Now pt with new concerns of possible colitis and aspiration pneumonia. NGT to suction continuing, 1300 ml of bilious output x 24 hours. Pt consuming a significant amount of ice. Pt Is frustrated he cannot eat. Per GI, bowel sounds heard today, passed some flatus today.   Per GI, etiology of prolonged ileus unclear. Does not suspect SBO. Wants to explore colitis that was found on CT scan. Sigmoidoscopy tomorrow.    Per pharmacy: Goal TPN rate is 90 mL/hr  (provides 121 g of protein and 2200 kcals per day)   Admit weight: 94.3 kg  Current weight: 95.8 kg   Edema: Generalized edema, LLE non-pitting   Intake/Output Summary (Last 24 hours) at 10/19/2023 1523 Last data filed at 10/19/2023 0600 Gross per 24 hour  Intake 2444.46 ml  Output 2500 ml  Net -55.54 ml   Drains/Lines: NGT: 1300 ml bilious output x 24 hours   Nutritionally Relevant Medications: Scheduled Meds:  Chlorhexidine  Gluconate Cloth  6 each Topical Daily   heparin   5,000 Units Subcutaneous Q8H   insulin  aspart  0-20 Units Subcutaneous Q4H   lidocaine   1 patch Transdermal Q24H   metoCLOPramide  (REGLAN ) injection  10 mg Intravenous Q8H   metoprolol  tartrate  5 mg Intravenous Q6H   ondansetron  (ZOFRAN ) IV  4 mg Intravenous Once   sodium chloride  flush  10-40 mL Intracatheter Q12H   traMADol   50 mg Oral Once   Continuous Infusions:  piperacillin -tazobactam (ZOSYN )  IV 3.375 g (10/19/23 1453)   TPN ADULT (ION) 90 mL/hr at 10/18/23 1806   TPN ADULT (ION)     Labs Reviewed: BUN 24 Albumin 2.2 TG 186 CBG ranges from 126-216 mg/dL over the last 24 hours HgbA1c 6.4  Diet Order:   Diet Order             Diet NPO time specified Except for: Sips with Meds  Diet effective midnight           Diet NPO time specified Except for: [employer],  Other (See Comments)  Diet effective now                   EDUCATION NEEDS:   Education needs have been addressed  Skin:  Skin Assessment: Reviewed RN Assessment  Last BM:  5/26 - type 7 1600 ml via FMS  Height:   Ht Readings from Last 1 Encounters:  10/01/23 5\' 9"  (1.753 m)    Weight:   Wt Readings from Last 1 Encounters:  10/19/23 95.8 kg    Ideal Body Weight:  72.7 kg  BMI:  Body mass index is 31.19 kg/m.  Estimated Nutritional Needs:   Kcal:  2200-2400  Protein:  110-130 grams  Fluid:  >/= 2 L   Frederik Jansky, RD Registered Dietitian  See Amion for more information

## 2023-10-19 NOTE — Progress Notes (Addendum)
 Daily Progress Note  DOA: 10/09/2023 Hospital Day: 17  Cc:   ileus, diarrhea  Brief History:  78 y.o. 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 Readmitted 5/18  with ileus, nausea, vomiting , electrolyte abnormalities and hypoxia ( 2/2 to abdominal distention). See 5/20 GI consult note for generalized  ileus.    ASSESSMENT    78 yo male admitted with Ileus following orthopedic surgery ( likely 2/2 to decreased mobility and opioids). Now with diarrhea and new colon wall thickening  Improvement in distention with electrolyte repletion, NGT, neostigmine, Reglan , Relistor ,  SMOG enemas and bowel rest but has had persistent diarrhea despite stopping Relistor . CT scan 5/25 showing diffuse small bowel dilation without transition point  and no distention of colon. NGT output not documented in I+0 but reading the notes it appears to have been high. However, patient says he has been consuming a significant amount of ice every day.  Today:  About 300 ml watery fluid in NGT cannister. A few bowel sounds today, K+ normal. No opioid use  Diffuse colon wall thickening, new Diarrhea  Etiology unclear. Negative C-diff and GI panel on admission and negative repeat C-diff on 5/24.  Today:  Yesterday's total output was 1645 ml.  Stool collection bag emptied just prior to day shift today. Since then , only a small amount of stool in tubing / nothing in collection bag. Per nursing he has had some minor leakage to stool from rectum.   Fever / leukocytosis Possible R lung aspiration Stool studies negative. UA negative WBC has normalized   PVCs / short runs of V.tach  Principal Problem:   Hypoxia Active Problems:   Ileus, postoperative (HCC)   Fever, unspecified   Leukocytosis   Hypokalemia   PLAN   --On empiric zosyn . --reglan  could be contributed to diarrhea but understand that it is needed for small  bowel ileus.  --He is very frustrated about prolonged hospitalization. He does have a few bowel sounds today so maybe starting to turn corner. Possibly give trial of clamping tube tomorrow.    Subjective   Frustrated about prolonged admission. Wants to eat. Wants to know what else can be done to speed up recovery   Objective   Recent Labs    10/17/23 0300 10/18/23 0510 10/19/23 0011  WBC 12.6* 7.3 7.1  HGB 9.7* 9.4* 9.7*  HCT 29.8* 29.9* 30.9*  MCV 91.7 93.7 93.4  PLT 443* 410* 427*   Recent Labs    10/17/23 1835 10/18/23 0510 10/19/23 0011  NA 140 138 142  141  K 3.7 4.1 4.7  4.8  CL 101 103 107  108  CO2 30 27 24  25   GLUCOSE 124* 203* 158*  158*  BUN 26* 25* 24*  23  CREATININE 0.66 0.66 0.71  0.70  CALCIUM  8.4* 8.5* 9.0  8.9   Recent Labs    10/18/23 0510 10/19/23 0011  PROT 5.5* 5.8*  ALBUMIN 2.1* 2.2*  2.1*  AST 49* 78*  ALT 58* 99*  ALKPHOS 85 115  BILITOT 0.7 0.6      Imaging:  CT ABDOMEN PELVIS W CONTRAST CLINICAL DATA:  Prolonged ileus, abnormal x-ray  EXAM: CT ABDOMEN AND PELVIS WITH CONTRAST  TECHNIQUE: Multidetector CT imaging of the abdomen and pelvis was performed using the standard protocol following bolus administration of intravenous contrast.  RADIATION DOSE REDUCTION: This exam was  performed according to the departmental dose-optimization program which includes automated exposure control, adjustment of the mA and/or kV according to patient size and/or use of iterative reconstruction technique.  CONTRAST:  75mL ISOVUE-370 IOPAMIDOL (ISOVUE-370) INJECTION 76%  COMPARISON:  10/09/2023, 10/06/2023  FINDINGS: Lower chest: There is patchy airspace disease seen throughout the right lung, new since prior study, consistent with infection or aspiration. Areas of linear consolidation within the inferior dependent lower lobes bilaterally may reflect superimposed atelectasis. No effusion or pneumothorax. Central venous  catheter tip at the atriocaval junction.  Hepatobiliary: No focal liver abnormality is seen. No gallstones, gallbladder wall thickening, or biliary dilatation.  Pancreas: Unremarkable. No pancreatic ductal dilatation or surrounding inflammatory changes.  Spleen: Normal in size without focal abnormality.  Adrenals/Urinary Tract: Stable appearance of the kidneys. No urinary tract calculi or obstructive uropathy. The adrenals and bladder are stable.  Stomach/Bowel: Distended fluid-filled loops of small bowel are identified, measuring up to 4.2 cm within the mid jejunum. Gas fluid levels are seen throughout the distended small bowel. No evidence of abrupt transition. The colon is decompressed, with diffuse colonic wall thickening compatible with inflammatory or infectious colitis. This has developed in the interim since prior study. Superimposed diverticulosis of the descending and sigmoid colon. Rectal tube is in place. Enteric catheter tip within the lumen of the gastric antrum.  Vascular/Lymphatic: Aortic atherosclerosis. No enlarged abdominal or pelvic lymph nodes.  Reproductive: Stable enlargement of the prostate.  Other: No free fluid or free intraperitoneal gas. Small fat containing umbilical hernia.  Musculoskeletal: No acute or destructive bony abnormalities. Reconstructed images demonstrate no additional findings.  IMPRESSION: 1. Diffuse colonic wall thickening which has developed since the prior study, consistent with inflammatory or infectious colitis. 2. Distended fluid-filled loops of small bowel with multiple gas fluid levels, most pronounced within the jejunum. There is no evidence of abrupt transition point to suggest obstruction, and findings could reflect sequela of ileus or diarrheal illness. 3. Interval development of multifocal right-sided airspace disease consistent with aspiration or infection. 4. Stable fat containing umbilical hernia. 5. Enlarged  prostate. 6. Stable distal colonic diverticulosis. 7.  Aortic Atherosclerosis (ICD10-I70.0).  Electronically Signed   By: Bobbye Burrow M.D.   On: 10/17/2023 20:10     Scheduled inpatient medications:   Chlorhexidine  Gluconate Cloth  6 each Topical Daily   heparin   5,000 Units Subcutaneous Q8H   insulin  aspart  0-20 Units Subcutaneous Q4H   lidocaine   1 patch Transdermal Q24H   metoCLOPramide  (REGLAN ) injection  10 mg Intravenous Q8H   metoprolol  tartrate  5 mg Intravenous Q6H   ondansetron  (ZOFRAN ) IV  4 mg Intravenous Once   sodium chloride  flush  10-40 mL Intracatheter Q12H   traMADol   50 mg Oral Once   Continuous inpatient infusions:   piperacillin -tazobactam (ZOSYN )  IV 3.375 g (10/19/23 0516)   TPN ADULT (ION) 90 mL/hr at 10/18/23 1806   TPN ADULT (ION)     PRN inpatient medications: acetaminophen , docusate sodium , menthol -cetylpyridinium, mouth rinse, polyethylene glycol, simethicone , sodium chloride  flush  Vital signs in last 24 hours: Temp:  [97.5 F (36.4 C)-99.3 F (37.4 C)] 98 F (36.7 C) (05/27 0745) Pulse Rate:  [84-93] 85 (05/27 0745) Resp:  [17-18] 18 (05/27 0745) BP: (122-133)/(66-82) 133/74 (05/27 0745) SpO2:  [95 %-100 %] 100 % (05/27 0745) Weight:  [95.8 kg] 95.8 kg (05/27 0417) Last BM Date : 10/18/23  Intake/Output Summary (Last 24 hours) at 10/19/2023 1047 Last data filed at 10/19/2023 0600 Gross per 24  hour  Intake 2444.46 ml  Output 2500 ml  Net -55.54 ml    Intake/Output from previous day: 05/26 0701 - 05/27 0700 In: 2444.5 [P.O.:100; I.V.:2199.8; IV Piggyback:144.7] Out: 2945 [Emesis/NG output:1300; Stool:1645] Intake/Output this shift: No intake/output data recorded.   Physical Exam:  General: Alert male in NAD Heart:  Regular rate and rhythm.  Pulmonary: Normal respiratory effort Abdomen: Soft, moderately distended, nontender. Umbilical hernia. A fewl bowel sounds. Extremities: No lower extremity edema  Neurologic: Alert and  oriented Psych: Pleasant. Cooperative     LOS: 9 days   Mai Chase Combs ,NP 10/19/2023, 10:47 AM  I have taken an interval history, thoroughly reviewed the chart and examined the patient. I agree with the Advanced Practitioner's note, impression and recommendations, and have recorded additional findings, impressions and recommendations below. I performed a substantive portion of this encounter (>50% time spent), including a complete performance of the medical decision making.  My additional thoughts are as follows:  Signout received from Dr. Cherryl Corona, chart reviewed, most recent CTAP images also personally reviewed.  He still has significant NG tube drainage, it is bilious suggesting that it is not just from ice consumption. He also has ongoing watery brown stool of unclear cause. Infectious studies negative thus far, C. difficile negative x 2  Abdomen softly distended and tympanitic with scant bowel sounds.  He says he passed a little gas earlier today.  Not clear why this ileus has been so protracted, but I believe everything that can be done from medical management has and is being done.  He is understandably frustrated by the time it is taking for this ileus to improve. Lack of reported small bowel transition point and continued passage of loose stool speaks against this being a mechanical small bowel obstruction.  He and I talked about how an apparent colitis on CT scan can at times be an artifact of imaging, and may even be edema from hypoalbuminemia.  Nevertheless, I think we should understand this abnormal GI imaging finding better with a sigmoidoscopy tomorrow.  He was agreeable after discussion procedure and risks.  The benefits and risks of the planned procedure(s) were described in detail with the patient or (when appropriate) their health care proxy.  Risks were outlined as including, but not limited to, bleeding, infection, perforation, adverse medication reaction leading to  cardiac or pulmonary decompensation, pancreatitis (if ERCP).  The limitation of incomplete mucosal visualization was also discussed.  No guarantees or warranties were given. Patient at increased risk for cardiopulmonary complications of procedure due to medical comorbidities.  Recent leukocytosis and acute worsening of his condition were probably from the pneumonia that is now improved with declining WBC.   Kerby Pearson III [phone]

## 2023-10-19 NOTE — Progress Notes (Signed)
 PROGRESS NOTE  Chase Combs ZOX:[id number] DOB: Sep 30, 1945 DOA: 10/09/2023 PCP: Ilsa Maltese, PA   LOS: 9 days   Brief narrative:  78 years old male with PMH significant for Left TKR on 10/01/2023 for osteoarthritis, CAD S/p MI s/p coronary stent x 2, who presented to [facility] with complaints of feeling unwell for 1 week since his total knee replacement.  He was noted to have distended abdomen and was tachycardic and tachypneic with distended abdomen and was hypoxic.    A CT abdomen/pelvis and CTA for Pulmonary emboli were done.  He was noted to have frequent PVCs and possible short runs of V Tach and Amiodarone  drip was started.  CT scan abd wihout definitive obstruction and CTA no Pulmonary emboli.  He was admitted for acute hypoxemic respiratory failure secondary to restricted ventilation due to markedly distended abdomen related to opiate induced ileus.  PICC line placed 5/24 and patient remains on TPN.   CT abdomen performed 5/25 with diffuse small bowel dilation without transition point and some concern for right lung aspiration.  Leukocytosis improving and GI following.  Assessment/Plan: Principal Problem:   Hypoxia Active Problems:   Ileus, postoperative (HCC)   Fever, unspecified   Leukocytosis   Hypokalemia   Acute hypoxemic respiratory failure: Aspiration pneumonia. Likely secondary to above hypoventilation from distended abdomen with possible aspiration.  CT scan showed some signs of aspiration.  On Zosyn  empirically.  Currently on room air.  Was on supplemental oxygen..  Will continue incentive spirometry.  Opioid Induced Ileus vs SBO Risk of Refeeding Syndrome Possible colitis NG tube and rectal tube in place.  Distention is slightly better.  Initially required Relistor  and Reglan .  Currently on docusate and Reglan  with MiraLAX .  Currently on TPN.  PICC line placed 10/16/2023. Colonic wall thickening was noted show on Zosyn .  C. difficile and GI panel  negative.  Continue PT OT.  Patient is still NPO.    CAD s/p CABG:  Frequent PVCs and NSVT HTN Currently on IV metoprolol .  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  Would benefit from lifestyle modification and weight loss as outpatient.   DVT prophylaxis: heparin  injection 5,000 Units Start: 10/10/23 1400 SCDs Start: 10/10/23 0437   Disposition: Home with home health likely in 2 to 3 days  Status is: Inpatient Remains inpatient appropriate because: pending clinical improvement, TPN, persistent ileus   Code Status:     Code Status: Full Code  Family Communication: None at bedside  Consultants: GI  Procedures: NG tube placement, rectal tube  Anti-infectives:  Zosyn  IV  Anti-infectives (From admission, onward)    Start     Dose/Rate Route Frequency Ordered Stop   10/18/23 0915  piperacillin -tazobactam (ZOSYN ) IVPB 3.375 g        3.375 g 12.5 mL/hr over 240 Minutes Intravenous Every 8 hours 10/18/23 0819     10/10/23 0600  piperacillin -tazobactam (ZOSYN ) IVPB 3.375 g  Status:  Discontinued        3.375 g 12.5 mL/hr over 240 Minutes Intravenous Every 8 hours 10/10/23 0445 10/11/23 0951   10/09/23 2345  vancomycin  (VANCOCIN ) 500 mg in sodium chloride  0.9 % 100 mL IVPB       Placed in "And" Linked Group   500 mg 100 mL/hr over 60 Minutes Intravenous  Once 10/09/23 2231 10/10/23 0103   10/09/23 2245  vancomycin  (VANCOCIN ) IVPB 1000 mg/200 mL premix       Placed in "And" Linked Group   1,000 mg 200 mL/hr  over 60 Minutes Intravenous  Once 10/09/23 2231 10/09/23 2359   10/09/23 2215  vancomycin  (VANCOCIN ) IVPB 1000 mg/200 mL premix  Status:  Discontinued        1,000 mg 200 mL/hr over 60 Minutes Intravenous  Once 10/09/23 2212 10/09/23 2227   10/09/23 2215  piperacillin -tazobactam (ZOSYN ) IVPB 3.375 g        3.375 g 100 mL/hr over 30 Minutes Intravenous  Once 10/09/23 2212 10/09/23 2330      Subjective: Today, patient was seen and examined at bedside.  Patient states that he  could not sleep in the nighttime.  Has been having some ice chips.  Has had some gas.  Denies overt bowel movements.  Objective: Vitals:   10/19/23 0417 10/19/23 0745  BP: 122/70 133/74  Pulse: 84 85  Resp: 18 18  Temp: 99.3 F (37.4 C) 98 F (36.7 C)  SpO2: 98% 100%    Intake/Output Summary (Last 24 hours) at 10/19/2023 0955 Last data filed at 10/19/2023 0600 Gross per 24 hour  Intake 2444.46 ml  Output 2500 ml  Net -55.54 ml   Filed Weights   10/17/23 0500 10/18/23 0500 10/19/23 0417  Weight: 95.2 kg 93 kg 95.8 kg   Body mass index is 31.19 kg/m.   Physical Exam:  GENERAL: Patient is alert awake and oriented. Not in obvious distress.  Obese build, NG tube in place.  On nasal cannula oxygen, HENT: No scleral pallor or icterus. Pupils equally reactive to light. Oral mucosa is moist NECK: is supple, no gross swelling noted. CHEST:   Diminished breath sounds bilaterally. CVS: S1 and S2 heard, no murmur. Regular rate and rhythm.  ABDOMEN: Soft, non-tender, bowel sounds are present.  Rectal tube in place. EXTREMITIES: No edema.  PICC line in place. CNS: Cranial nerves are intact. No focal motor deficits. SKIN: warm and dry without rashes.  Data Review: I have personally reviewed the following laboratory data and studies,  CBC: Recent Labs  Lab 10/15/23 2149 10/16/23 1054 10/17/23 0300 10/18/23 0510 10/19/23 0011  WBC 10.2 16.5* 12.6* 7.3 7.1  HGB 10.2* 10.1* 9.7* 9.4* 9.7*  HCT 31.2* 30.3* 29.8* 29.9* 30.9*  MCV 91.5 89.9 91.7 93.7 93.4  PLT 520* 473* 443* 410* 427*   Basic Metabolic Panel: Recent Labs  Lab 10/14/23 1700 10/15/23 0611 10/16/23 1054 10/17/23 0300 10/17/23 1835 10/18/23 0510 10/19/23 0011  NA 140 140 138 138 140 138 142  141  K 3.3* 3.2* 3.2* 3.3* 3.7 4.1 4.7  4.8  CL 96* 99 97* 97* 101 103 107  108  CO2 34* 32 31 33* 30 27 24  25   GLUCOSE 201* 238* 165* 164* 124* 203* 158*  158*  BUN 29* 22 21 29* 26* 25* 24*  23  CREATININE 0.70  0.68 0.99 0.85 0.66 0.66 0.71  0.70  CALCIUM  8.3* 8.3* 8.1* 8.4* 8.4* 8.5* 9.0  8.9  MG 2.4 2.5* 2.0 2.6*  --  2.4 2.2  PHOS 2.8 2.7 2.9  --   --  3.8 4.1   Liver Function Tests: Recent Labs  Lab 10/14/23 0323 10/14/23 1700 10/15/23 0611 10/18/23 0510 10/19/23 0011  AST 25  --   --  49* 78*  ALT 34  --   --  58* 99*  ALKPHOS 71  --   --  85 115  BILITOT 0.9  --   --  0.7 0.6  PROT 5.1*  --   --  5.5* 5.8*  ALBUMIN 2.1* 2.1* 2.1*  2.1* 2.2*  2.1*   No results for input(s): "LIPASE", "AMYLASE" in the last 168 hours. No results for input(s): "AMMONIA" in the last 168 hours. Cardiac Enzymes: No results for input(s): "CKTOTAL", "CKMB", "CKMBINDEX", "TROPONINI" in the last 168 hours. BNP (last 3 results) Recent Labs    10/10/23 0619  BNP 373.7*    ProBNP (last 3 results) No results for input(s): "PROBNP" in the last 8760 hours.  CBG: Recent Labs  Lab 10/18/23 2005 10/18/23 2343 10/19/23 0032 10/19/23 0419 10/19/23 0741  GLUCAP 146* 143* 148* 155* 151*   Recent Results (from the past 240 hours)  Culture, blood (Routine x 2)     Status: None   Collection Time: 10/09/23  9:42 PM   Specimen: BLOOD  Result Value Ref Range Status   Specimen Description   Final    BLOOD RIGHT ANTECUBITAL Performed at [facility], [address], [place], [place] [address]    Special Requests   Final    BOTTLES DRAWN AEROBIC AND ANAEROBIC Blood Culture adequate volume Performed at [facility], [address] [place], [place] [address]    Culture   Final    NO GROWTH 5 DAYS Performed at [facility], [address], [place], [place] [address]    Report Status 10/15/2023 FINAL  Final  Resp panel by RT-PCR (RSV, Flu A&B, Covid) Anterior Nasal Swab     Status: None   Collection Time: 10/09/23 10:07 PM   Specimen: Anterior Nasal Swab  Result Value Ref Range Status   SARS Coronavirus 2 by RT PCR NEGATIVE NEGATIVE Final    Comment:  (NOTE) SARS-CoV-2 target nucleic acids are NOT DETECTED.  The SARS-CoV-2 RNA is generally detectable in upper respiratory specimens during the acute phase of infection. The lowest concentration of SARS-CoV-2 viral copies this assay can detect is 138 copies/mL. A negative result does not preclude SARS-Cov-2 infection and should not be used as the sole basis for treatment or other patient management decisions. A negative result may occur with  improper specimen collection/handling, submission of specimen other than nasopharyngeal swab, presence of viral mutation(s) within the areas targeted by this assay, and inadequate number of viral copies(<138 copies/mL). A negative result must be combined with clinical observations, patient history, and epidemiological information. The expected result is Negative.  Fact Sheet for Patients:  BloggerCourse.com  Fact Sheet for Healthcare Providers:  SeriousBroker.it  This test is no t yet approved or cleared by the [place]  FDA and  has been authorized for detection and/or diagnosis of SARS-CoV-2 by FDA under an Emergency Use Authorization (EUA). This EUA will remain  in effect (meaning this test can be used) for the duration of the COVID-19 declaration under Section 564(b)(1) of the Act, 21 U.S.C.section 360bbb-3(b)(1), unless the authorization is terminated  or revoked sooner.       Influenza A by PCR NEGATIVE NEGATIVE Final   Influenza B by PCR NEGATIVE NEGATIVE Final    Comment: (NOTE) The Xpert Xpress SARS-CoV-2/FLU/RSV plus assay is intended as an aid in the diagnosis of influenza from Nasopharyngeal swab specimens and should not be used as a sole basis for treatment. Nasal washings and aspirates are unacceptable for Xpert Xpress SARS-CoV-2/FLU/RSV testing.  Fact Sheet for Patients: BloggerCourse.com  Fact Sheet for Healthcare  Providers: SeriousBroker.it  This test is not yet approved or cleared by the [place]  FDA and has been authorized for detection and/or diagnosis of SARS-CoV-2 by FDA under an Emergency Use Authorization (EUA).  This EUA will remain in effect (meaning this test can be used) for the duration of the COVID-19 declaration under Section 564(b)(1) of the Act, 21 U.S.C. section 360bbb-3(b)(1), unless the authorization is terminated or revoked.     Resp Syncytial Virus by PCR NEGATIVE NEGATIVE Final    Comment: (NOTE) Fact Sheet for Patients: BloggerCourse.com  Fact Sheet for Healthcare Providers: SeriousBroker.it  This test is not yet approved or cleared by the [place]  FDA and has been authorized for detection and/or diagnosis of SARS-CoV-2 by FDA under an Emergency Use Authorization (EUA). This EUA will remain in effect (meaning this test can be used) for the duration of the COVID-19 declaration under Section 564(b)(1) of the Act, 21 U.S.C. section 360bbb-3(b)(1), unless the authorization is terminated or revoked.  Performed at [facility], [address] [place], [place] [address]   Culture, blood (Routine X 2) w Reflex to ID Panel     Status: None   Collection Time: 10/09/23 10:20 PM   Specimen: BLOOD LEFT WRIST  Result Value Ref Range Status   Specimen Description   Final    BLOOD LEFT WRIST Performed at [facility], [address] [place], [place] [address]    Special Requests   Final    BOTTLES DRAWN AEROBIC AND ANAEROBIC Blood Culture adequate volume Performed at [facility], [address], [place], [place] [address]    Culture   Final    NO GROWTH 5 DAYS Performed at [facility], [address] [place], [place] [address]    Report Status 10/15/2023 FINAL  Final  MRSA Next Gen by PCR, Nasal     Status: None   Collection Time:  10/10/23  3:37 AM   Specimen: Nasal Mucosa; Nasal Swab  Result Value Ref Range Status   MRSA by PCR Next Gen NOT DETECTED NOT DETECTED Final    Comment: (NOTE) The GeneXpert MRSA Assay (FDA approved for NASAL specimens only), is one component of a comprehensive MRSA colonization surveillance program. It is not intended to diagnose MRSA infection nor to guide or monitor treatment for MRSA infections. Test performance is not FDA approved in patients less than 5 years old. Performed at [facility], [address] [place], [place] [address]   C Difficile Quick Screen w PCR reflex     Status: None   Collection Time: 10/10/23  6:41 AM   Specimen: STOOL  Result Value Ref Range Status   C Diff antigen NEGATIVE NEGATIVE Final   C Diff toxin NEGATIVE NEGATIVE Final   C Diff interpretation No C. difficile detected.  Final    Comment: Performed at [facility], [address], [place], [place] [address]  Gastrointestinal Panel by PCR , Stool     Status: None   Collection Time: 10/10/23  6:41 AM   Specimen: STOOL  Result Value Ref Range Status   Campylobacter species NOT DETECTED NOT DETECTED Final   Plesimonas shigelloides NOT DETECTED NOT DETECTED Final   Salmonella species NOT DETECTED NOT DETECTED Final   Yersinia enterocolitica NOT DETECTED NOT DETECTED Final   Vibrio species NOT DETECTED NOT DETECTED Final   Vibrio cholerae NOT DETECTED NOT DETECTED Final   Enteroaggregative E coli (EAEC) NOT DETECTED NOT DETECTED Final   Enteropathogenic E coli (EPEC) NOT DETECTED NOT DETECTED Final   Enterotoxigenic E coli (ETEC) NOT DETECTED NOT DETECTED Final   Shiga like toxin producing E coli (STEC) NOT DETECTED NOT DETECTED  Final   Shigella/Enteroinvasive E coli (EIEC) NOT DETECTED NOT DETECTED Final   Cryptosporidium NOT DETECTED NOT DETECTED Final   Cyclospora cayetanensis NOT DETECTED NOT DETECTED Final   Entamoeba histolytica NOT DETECTED NOT DETECTED Final   Giardia lamblia  NOT DETECTED NOT DETECTED Final   Adenovirus F40/41 NOT DETECTED NOT DETECTED Final   Astrovirus NOT DETECTED NOT DETECTED Final   Norovirus GI/GII NOT DETECTED NOT DETECTED Final   Rotavirus A NOT DETECTED NOT DETECTED Final   Sapovirus (I, II, IV, and V) NOT DETECTED NOT DETECTED Final    Comment: Performed at [facility], [address], [place], [place] [address]  C Difficile Quick Screen (NO PCR Reflex)     Status: None   Collection Time: 10/16/23 12:31 PM   Specimen: STOOL  Result Value Ref Range Status   C Diff antigen NEGATIVE NEGATIVE Final   C Diff toxin NEGATIVE NEGATIVE Final   C Diff interpretation No C. difficile detected.  Final    Comment: Performed at [facility], [address], [place], [place] [address]     Studies: CT ABDOMEN PELVIS W CONTRAST Result Date: 10/17/2023 CLINICAL DATA:  Prolonged ileus, abnormal x-ray EXAM: CT ABDOMEN AND PELVIS WITH CONTRAST TECHNIQUE: Multidetector CT imaging of the abdomen and pelvis was performed using the standard protocol following bolus administration of intravenous contrast. RADIATION DOSE REDUCTION: This exam was performed according to the departmental dose-optimization program which includes automated exposure control, adjustment of the mA and/or kV according to patient size and/or use of iterative reconstruction technique. CONTRAST:  75mL ISOVUE-370 IOPAMIDOL (ISOVUE-370) INJECTION 76% COMPARISON:  10/09/2023, 10/06/2023 FINDINGS: Lower chest: There is patchy airspace disease seen throughout the right lung, new since prior study, consistent with infection or aspiration. Areas of linear consolidation within the inferior dependent lower lobes bilaterally may reflect superimposed atelectasis. No effusion or pneumothorax. Central venous catheter tip at the atriocaval junction. Hepatobiliary: No focal liver abnormality is seen. No gallstones, gallbladder wall thickening, or biliary dilatation. Pancreas: Unremarkable. No  pancreatic ductal dilatation or surrounding inflammatory changes. Spleen: Normal in size without focal abnormality. Adrenals/Urinary Tract: Stable appearance of the kidneys. No urinary tract calculi or obstructive uropathy. The adrenals and bladder are stable. Stomach/Bowel: Distended fluid-filled loops of small bowel are identified, measuring up to 4.2 cm within the mid jejunum. Gas fluid levels are seen throughout the distended small bowel. No evidence of abrupt transition. The colon is decompressed, with diffuse colonic wall thickening compatible with inflammatory or infectious colitis. This has developed in the interim since prior study. Superimposed diverticulosis of the descending and sigmoid colon. Rectal tube is in place. Enteric catheter tip within the lumen of the gastric antrum. Vascular/Lymphatic: Aortic atherosclerosis. No enlarged abdominal or pelvic lymph nodes. Reproductive: Stable enlargement of the prostate. Other: No free fluid or free intraperitoneal gas. Small fat containing umbilical hernia. Musculoskeletal: No acute or destructive bony abnormalities. Reconstructed images demonstrate no additional findings. IMPRESSION: 1. Diffuse colonic wall thickening which has developed since the prior study, consistent with inflammatory or infectious colitis. 2. Distended fluid-filled loops of small bowel with multiple gas fluid levels, most pronounced within the jejunum. There is no evidence of abrupt transition point to suggest obstruction, and findings could reflect sequela of ileus or diarrheal illness. 3. Interval development of multifocal right-sided airspace disease consistent with aspiration or infection. 4. Stable fat containing umbilical hernia. 5. Enlarged prostate. 6. Stable distal colonic diverticulosis. 7.  Aortic Atherosclerosis (ICD10-I70.0). Electronically Signed   By: Bambi Lever  Bevin Bucks M.D.   On: 10/17/2023 20:10      Rosena Conradi, MD  [facility] 10/19/2023  If 7PM-7AM,  please contact night-coverage

## 2023-10-19 NOTE — Progress Notes (Signed)
 Physical Therapy Treatment Patient Details Name: Chase Combs MRN: [id number] DOB: 1946-03-28 Today's Date: 10/19/2023   History of Present Illness 78 yo male presented to [facility] 10/09/23 for N/V and abdominal distension. Pt with ileus, short runs of Vtach, and hypoxic respiratory failure.  10/10/23 transfer to [facility]. PICC placed 5/21. Pt required NG tube placement. PMhx: 10/01/23 Lt TKA, CAD, coronary stent, obesity, HTN, prostate CA, OSA.    PT Comments  Pt received in supine, agreeable to therapy session with encouragement, pt reluctant and slow to initiate. Pt able to perform multiple STS from EOB but standing tolerance limited due to increased lightheadedness in standing at RW. Attempted orthostatic BP reading with notable 20pt drop in SBP from supine to sitting but unable to complete full standing BP due to pt fatigue needing to sit about halfway through check. Hopefully with diet advancement and increased PO intake, BP will stabilize, he may benefit from BLE compression socks in the meantime to see if this improves his hemodynamic stability. Pt continues to benefit from PT services to progress toward functional mobility goals, currently recommend HHPT pending progress acutely.    10/19/23 1430  Vital Signs  Patient Position (if appropriate) Orthostatic Vitals  Orthostatic Lying   BP- Lying 138/81  Pulse- Lying 87  Orthostatic Sitting  BP- Sitting 118/70  Pulse- Sitting 88  Orthostatic Standing at 0 minutes  BP- Standing at 0 minutes  (unable to maintain standing)     If plan is discharge home, recommend the following: A little help with walking and/or transfers;A little help with bathing/dressing/bathroom;Assist for transportation;Help with stairs or ramp for entrance;Assistance with cooking/housework   Can travel by private vehicle        Equipment Recommendations  None recommended by PT    Recommendations for Other Services       Precautions / Restrictions  Precautions Precautions: Fall;Knee;Other (comment) Precaution Booklet Issued: No Recall of Precautions/Restrictions: Intact Precaution/Restrictions Comments: NG tube, flexiseal, PICC Restrictions Weight Bearing Restrictions Per Provider Order: Yes LLE Weight Bearing Per Provider Order: Weight bearing as tolerated Other Position/Activity Restrictions: no pillow or bolster under bend of knee at rest, OK to have under ankle to promote L knee extension     Mobility  Bed Mobility Overal bed mobility: Needs Assistance Bed Mobility: Supine to Sit     Supine to sit: Min assist, Used rails, HOB elevated Sit to supine: Min assist, HOB elevated   General bed mobility comments: min A for trunk elevation and to bring legs back up into bed, increased time to perform; use of bed features/rails    Transfers Overall transfer level: Needs assistance Equipment used: Rolling walker (2 wheels) Transfers: Sit to/from Stand, Bed to chair/wheelchair/BSC Sit to Stand: Min assist, From elevated surface, Contact guard assist   Step pivot transfers: Min assist       General transfer comment: EOB<>RW x3 trials, minA first attempt, CGA for second/third attempt after bed height elevated. x2 pivotal steps toward [employer] on his L on two standing trials to simulate step pivot to chair; pt defers OOB to chair as he sat in chair ~3 hours earlier in the day and now fatigued.    Ambulation/Gait               General Gait Details: Pt refused to attempt due to lightheadedness, likely orthostatic hypotension as SBP dropped 20 pts from supine to sitting.   Stairs             Psychologist, prison and probation services  Tilt Bed    Modified Rankin (Stroke Patients Only)       Balance Overall balance assessment: Needs assistance Sitting-balance support: No upper extremity supported Sitting balance-Leahy Scale: Fair     Standing balance support: Bilateral upper extremity supported, During functional activity,  Reliant on assistive device for balance Standing balance-Leahy Scale: Poor Standing balance comment: RW and CGA to minA for dynamic standing tasks; limited to <3 mins standing at a time.                            Communication Communication Communication: No apparent difficulties  Cognition Arousal: Alert Behavior During Therapy: WFL for tasks assessed/performed   PT - Cognitive impairments: Other                       PT - Cognition Comments: Self limiting Following commands: Intact      Cueing Cueing Techniques: Verbal cues  Exercises Total Joint Exercises Goniometric ROM: grossly 0-90 deg sitting EOB with AA Marching in Standing: AROM, Both, 5 reps, Standing (x2 trials) Other Exercises Other Exercises: STS x 3 reps and static standing 1-3 mins each attempt for BLE strengthening. Pt not able to do more than pre-gait marching and sidesteps in standing before dizziness increased.    General Comments General comments (skin integrity, edema, etc.): SpO2 WFL on 3L O2 [place], pt often doffing [place] due to "nose feeling drippy" and pt wiping at his nose, RN notified in case NGT needs to be checked      Pertinent Vitals/Pain Pain Assessment Faces Pain Scale: Hurts little more Pain Location: abdomen at rest Pain Descriptors / Indicators: Discomfort, Grimacing, Sore    Home Living                          Prior Function            PT Goals (current goals can now be found in the care plan section) Acute Rehab PT Goals Patient Stated Goal: To go home PT Goal Formulation: With patient Time For Goal Achievement: 10/27/23 Progress towards PT goals: Progressing toward goals    Frequency    Min 2X/week      PT Plan      Co-evaluation              AM-PAC PT "6 Clicks" Mobility   Outcome Measure  Help needed turning from your back to your side while in a flat bed without using bedrails?: A Little Help needed moving from lying on your back  to sitting on the side of a flat bed without using bedrails?: A Little Help needed moving to and from a bed to a chair (including a wheelchair)?: A Little Help needed standing up from a chair using your arms (e.g., wheelchair or bedside chair)?: A Little Help needed to walk in hospital room?: Total Help needed climbing 3-5 steps with a railing? : Total 6 Click Score: 14    End of Session Equipment Utilized During Treatment: Oxygen;Other (comment) (defer gait belt due to pt multiple lines and discomfort; pt at EOB throughout did not walk away from bed for his safety given symptoms) Activity Tolerance: Patient limited by fatigue;Other (comment);Treatment limited secondary to medical complications (Comment) (feels lightheaded/"swimmy headed", "not like the room is spinning") Patient left: in bed;with call bell/phone within reach;with bed alarm set;with family/visitor present;Other (comment) (HOB locked at 30 deg and above  given he has NGT in place; spouse present) Nurse Communication: Mobility status;Other (comment);Patient requests pain meds;Precautions (pt asking RN to come "to talk about my pain meds/options") PT Visit Diagnosis: Difficulty in walking, not elsewhere classified (R26.2);Other abnormalities of gait and mobility (R26.89) Pain - part of body: Hip ("my nose and both hips")     Time: 1357-1435 PT Time Calculation (min) (ACUTE ONLY): 38 min  Charges:    $Therapeutic Exercise: 8-22 mins $Therapeutic Activity: 23-37 mins PT General Charges $$ ACUTE PT VISIT: 1 Visit                     Amely Voorheis P., PTA Acute Rehabilitation Services Secure Chat Preferred 9a-5:30pm Office: [phone]    Mariel Shope [facility] 10/19/2023, 4:03 PM

## 2023-10-19 NOTE — H&P (View-Only) (Signed)
 Daily Progress Note  DOA: 10/09/2023 Hospital Day: 17  Cc:   ileus, diarrhea  Brief History:  78 y.o. 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 Readmitted 5/18  with ileus, nausea, vomiting , electrolyte abnormalities and hypoxia ( 2/2 to abdominal distention). See 5/20 GI consult note for generalized  ileus.    ASSESSMENT    78 yo male admitted with Ileus following orthopedic surgery ( likely 2/2 to decreased mobility and opioids). Now with diarrhea and new colon wall thickening  Improvement in distention with electrolyte repletion, NGT, neostigmine, Reglan , Relistor ,  SMOG enemas and bowel rest but has had persistent diarrhea despite stopping Relistor . CT scan 5/25 showing diffuse small bowel dilation without transition point  and no distention of colon. NGT output not documented in I+0 but reading the notes it appears to have been high. However, patient says he has been consuming a significant amount of ice every day.  Today:  About 300 ml watery fluid in NGT cannister. A few bowel sounds today, K+ normal. No opioid use  Diffuse colon wall thickening, new Diarrhea  Etiology unclear. Negative C-diff and GI panel on admission and negative repeat C-diff on 5/24.  Today:  Yesterday's total output was 1645 ml.  Stool collection bag emptied just prior to day shift today. Since then , only a small amount of stool in tubing / nothing in collection bag. Per nursing he has had some minor leakage to stool from rectum.   Fever / leukocytosis Possible R lung aspiration Stool studies negative. UA negative WBC has normalized   PVCs / short runs of V.tach  Principal Problem:   Hypoxia Active Problems:   Ileus, postoperative (HCC)   Fever, unspecified   Leukocytosis   Hypokalemia   PLAN   --On empiric zosyn . --reglan  could be contributed to diarrhea but understand that it is needed for small  bowel ileus.  --He is very frustrated about prolonged hospitalization. He does have a few bowel sounds today so maybe starting to turn corner. Possibly give trial of clamping tube tomorrow.    Subjective   Frustrated about prolonged admission. Wants to eat. Wants to know what else can be done to speed up recovery   Objective   Recent Labs    10/17/23 0300 10/18/23 0510 10/19/23 0011  WBC 12.6* 7.3 7.1  HGB 9.7* 9.4* 9.7*  HCT 29.8* 29.9* 30.9*  MCV 91.7 93.7 93.4  PLT 443* 410* 427*   Recent Labs    10/17/23 1835 10/18/23 0510 10/19/23 0011  NA 140 138 142  141  K 3.7 4.1 4.7  4.8  CL 101 103 107  108  CO2 30 27 24  25   GLUCOSE 124* 203* 158*  158*  BUN 26* 25* 24*  23  CREATININE 0.66 0.66 0.71  0.70  CALCIUM  8.4* 8.5* 9.0  8.9   Recent Labs    10/18/23 0510 10/19/23 0011  PROT 5.5* 5.8*  ALBUMIN 2.1* 2.2*  2.1*  AST 49* 78*  ALT 58* 99*  ALKPHOS 85 115  BILITOT 0.7 0.6      Imaging:  CT ABDOMEN PELVIS W CONTRAST CLINICAL DATA:  Prolonged ileus, abnormal x-ray  EXAM: CT ABDOMEN AND PELVIS WITH CONTRAST  TECHNIQUE: Multidetector CT imaging of the abdomen and pelvis was performed using the standard protocol following bolus administration of intravenous contrast.  RADIATION DOSE REDUCTION: This exam was  performed according to the departmental dose-optimization program which includes automated exposure control, adjustment of the mA and/or kV according to patient size and/or use of iterative reconstruction technique.  CONTRAST:  75mL ISOVUE-370 IOPAMIDOL (ISOVUE-370) INJECTION 76%  COMPARISON:  10/09/2023, 10/06/2023  FINDINGS: Lower chest: There is patchy airspace disease seen throughout the right lung, new since prior study, consistent with infection or aspiration. Areas of linear consolidation within the inferior dependent lower lobes bilaterally may reflect superimposed atelectasis. No effusion or pneumothorax. Central venous  catheter tip at the atriocaval junction.  Hepatobiliary: No focal liver abnormality is seen. No gallstones, gallbladder wall thickening, or biliary dilatation.  Pancreas: Unremarkable. No pancreatic ductal dilatation or surrounding inflammatory changes.  Spleen: Normal in size without focal abnormality.  Adrenals/Urinary Tract: Stable appearance of the kidneys. No urinary tract calculi or obstructive uropathy. The adrenals and bladder are stable.  Stomach/Bowel: Distended fluid-filled loops of small bowel are identified, measuring up to 4.2 cm within the mid jejunum. Gas fluid levels are seen throughout the distended small bowel. No evidence of abrupt transition. The colon is decompressed, with diffuse colonic wall thickening compatible with inflammatory or infectious colitis. This has developed in the interim since prior study. Superimposed diverticulosis of the descending and sigmoid colon. Rectal tube is in place. Enteric catheter tip within the lumen of the gastric antrum.  Vascular/Lymphatic: Aortic atherosclerosis. No enlarged abdominal or pelvic lymph nodes.  Reproductive: Stable enlargement of the prostate.  Other: No free fluid or free intraperitoneal gas. Small fat containing umbilical hernia.  Musculoskeletal: No acute or destructive bony abnormalities. Reconstructed images demonstrate no additional findings.  IMPRESSION: 1. Diffuse colonic wall thickening which has developed since the prior study, consistent with inflammatory or infectious colitis. 2. Distended fluid-filled loops of small bowel with multiple gas fluid levels, most pronounced within the jejunum. There is no evidence of abrupt transition point to suggest obstruction, and findings could reflect sequela of ileus or diarrheal illness. 3. Interval development of multifocal right-sided airspace disease consistent with aspiration or infection. 4. Stable fat containing umbilical hernia. 5. Enlarged  prostate. 6. Stable distal colonic diverticulosis. 7.  Aortic Atherosclerosis (ICD10-I70.0).  Electronically Signed   By: Bobbye Burrow M.D.   On: 10/17/2023 20:10     Scheduled inpatient medications:   Chlorhexidine  Gluconate Cloth  6 each Topical Daily   heparin   5,000 Units Subcutaneous Q8H   insulin  aspart  0-20 Units Subcutaneous Q4H   lidocaine   1 patch Transdermal Q24H   metoCLOPramide  (REGLAN ) injection  10 mg Intravenous Q8H   metoprolol  tartrate  5 mg Intravenous Q6H   ondansetron  (ZOFRAN ) IV  4 mg Intravenous Once   sodium chloride  flush  10-40 mL Intracatheter Q12H   traMADol   50 mg Oral Once   Continuous inpatient infusions:   piperacillin -tazobactam (ZOSYN )  IV 3.375 g (10/19/23 0516)   TPN ADULT (ION) 90 mL/hr at 10/18/23 1806   TPN ADULT (ION)     PRN inpatient medications: acetaminophen , docusate sodium , menthol -cetylpyridinium, mouth rinse, polyethylene glycol, simethicone , sodium chloride  flush  Vital signs in last 24 hours: Temp:  [97.5 F (36.4 C)-99.3 F (37.4 C)] 98 F (36.7 C) (05/27 0745) Pulse Rate:  [84-93] 85 (05/27 0745) Resp:  [17-18] 18 (05/27 0745) BP: (122-133)/(66-82) 133/74 (05/27 0745) SpO2:  [95 %-100 %] 100 % (05/27 0745) Weight:  [95.8 kg] 95.8 kg (05/27 0417) Last BM Date : 10/18/23  Intake/Output Summary (Last 24 hours) at 10/19/2023 1047 Last data filed at 10/19/2023 0600 Gross per 24  hour  Intake 2444.46 ml  Output 2500 ml  Net -55.54 ml    Intake/Output from previous day: 05/26 0701 - 05/27 0700 In: 2444.5 [P.O.:100; I.V.:2199.8; IV Piggyback:144.7] Out: 2945 [Emesis/NG output:1300; Stool:1645] Intake/Output this shift: No intake/output data recorded.   Physical Exam:  General: Alert male in NAD Heart:  Regular rate and rhythm.  Pulmonary: Normal respiratory effort Abdomen: Soft, moderately distended, nontender. Umbilical hernia. A fewl bowel sounds. Extremities: No lower extremity edema  Neurologic: Alert and  oriented Psych: Pleasant. Cooperative     LOS: 9 days   Mai Schwalbe ,NP 10/19/2023, 10:47 AM  I have taken an interval history, thoroughly reviewed the chart and examined the patient. I agree with the Advanced Practitioner's note, impression and recommendations, and have recorded additional findings, impressions and recommendations below. I performed a substantive portion of this encounter (>50% time spent), including a complete performance of the medical decision making.  My additional thoughts are as follows:  Signout received from Dr. Cherryl Corona, chart reviewed, most recent CTAP images also personally reviewed.  He still has significant NG tube drainage, it is bilious suggesting that it is not just from ice consumption. He also has ongoing watery brown stool of unclear cause. Infectious studies negative thus far, C. difficile negative x 2  Abdomen softly distended and tympanitic with scant bowel sounds.  He says he passed a little gas earlier today.  Not clear why this ileus has been so protracted, but I believe everything that can be done from medical management has and is being done.  He is understandably frustrated by the time it is taking for this ileus to improve. Lack of reported small bowel transition point and continued passage of loose stool speaks against this being a mechanical small bowel obstruction.  He and I talked about how an apparent colitis on CT scan can at times be an artifact of imaging, and may even be edema from hypoalbuminemia.  Nevertheless, I think we should understand this abnormal GI imaging finding better with a sigmoidoscopy tomorrow.  He was agreeable after discussion procedure and risks.  The benefits and risks of the planned procedure(s) were described in detail with the patient or (when appropriate) their health care proxy.  Risks were outlined as including, but not limited to, bleeding, infection, perforation, adverse medication reaction leading to  cardiac or pulmonary decompensation, pancreatitis (if ERCP).  The limitation of incomplete mucosal visualization was also discussed.  No guarantees or warranties were given. Patient at increased risk for cardiopulmonary complications of procedure due to medical comorbidities.  Recent leukocytosis and acute worsening of his condition were probably from the pneumonia that is now improved with declining WBC.   Kerby Pearson III [phone]

## 2023-10-20 ENCOUNTER — Inpatient Hospital Stay (HOSPITAL_COMMUNITY): Admitting: Certified Registered"

## 2023-10-20 ENCOUNTER — Encounter (HOSPITAL_COMMUNITY)
Admission: EM | Disposition: A | Discharge status: Home Health | Payer: Self-pay | Source: Home / Self Care | Attending: Internal Medicine

## 2023-10-20 ENCOUNTER — Encounter (HOSPITAL_COMMUNITY): Payer: Self-pay

## 2023-10-20 DIAGNOSIS — K648 Other hemorrhoids: Secondary | ICD-10-CM | POA: Diagnosis not present

## 2023-10-20 DIAGNOSIS — K5730 Diverticulosis of large intestine without perforation or abscess without bleeding: Secondary | ICD-10-CM

## 2023-10-20 DIAGNOSIS — R0902 Hypoxemia: Secondary | ICD-10-CM | POA: Diagnosis not present

## 2023-10-20 DIAGNOSIS — R197 Diarrhea, unspecified: Secondary | ICD-10-CM

## 2023-10-20 DIAGNOSIS — R933 Abnormal findings on diagnostic imaging of other parts of digestive tract: Secondary | ICD-10-CM

## 2023-10-20 HISTORY — PX: FLEXIBLE SIGMOIDOSCOPY: SHX5431

## 2023-10-20 LAB — MAGNESIUM: Magnesium: 2.3 mg/dL (ref 1.7–2.4)

## 2023-10-20 LAB — GLUCOSE, CAPILLARY
Glucose-Capillary: 115 mg/dL — ABNORMAL HIGH (ref 70–99)
Glucose-Capillary: 128 mg/dL — ABNORMAL HIGH (ref 70–99)
Glucose-Capillary: 132 mg/dL — ABNORMAL HIGH (ref 70–99)
Glucose-Capillary: 137 mg/dL — ABNORMAL HIGH (ref 70–99)
Glucose-Capillary: 139 mg/dL — ABNORMAL HIGH (ref 70–99)
Glucose-Capillary: 161 mg/dL — ABNORMAL HIGH (ref 70–99)

## 2023-10-20 LAB — BASIC METABOLIC PANEL WITH GFR
Anion gap: 6 (ref 5–15)
BUN: 26 mg/dL — ABNORMAL HIGH (ref 8–23)
CO2: 22 mmol/L (ref 22–32)
Calcium: 8.4 mg/dL — ABNORMAL LOW (ref 8.9–10.3)
Chloride: 112 mmol/L — ABNORMAL HIGH (ref 98–111)
Creatinine, Ser: 0.74 mg/dL (ref 0.61–1.24)
GFR, Estimated: >60 mL/min (ref >60)
Glucose, Bld: 158 mg/dL — ABNORMAL HIGH (ref 70–99)
Potassium: 5.3 mmol/L — ABNORMAL HIGH (ref 3.5–5.1)
Sodium: 140 mmol/L (ref 135–145)

## 2023-10-20 SURGERY — SIGMOIDOSCOPY, FLEXIBLE
Anesthesia: Monitor Anesthesia Care

## 2023-10-20 MED ORDER — PROPOFOL 500 MG/50ML IV EMUL
INTRAVENOUS | Status: DC | PRN
  Administered 2023-10-20: 100 ug/kg/min via INTRAVENOUS

## 2023-10-20 MED ORDER — TRAVASOL 10 % IV SOLN
INTRAVENOUS | Status: AC
  Filled 2023-10-20: qty 1209.6

## 2023-10-20 MED ORDER — TROLAMINE SALICYLATE 10 % EX CREA: TOPICAL_CREAM | Freq: Two times a day (BID) | CUTANEOUS | Status: DC | PRN

## 2023-10-20 MED ORDER — SODIUM CHLORIDE 0.9 % IV SOLN: INTRAVENOUS | Status: DC

## 2023-10-20 MED ORDER — MUSCLE RUB 10-15 % EX CREA
TOPICAL_CREAM | CUTANEOUS | Status: DC | PRN
  Administered 2023-10-20 – 2023-10-21 (×2): 1 via TOPICAL
  Filled 2023-10-20: qty 85

## 2023-10-20 MED ORDER — SUCCINYLCHOLINE CHLORIDE 200 MG/10ML IV SOSY
PREFILLED_SYRINGE | INTRAVENOUS | Status: DC | PRN
  Administered 2023-10-20: 140 mg via INTRAVENOUS

## 2023-10-20 MED ORDER — PROPOFOL 10 MG/ML IV BOLUS
INTRAVENOUS | Status: DC | PRN
  Administered 2023-10-20: 100 mg via INTRAVENOUS

## 2023-10-20 MED ORDER — FENTANYL CITRATE (PF) 100 MCG/2ML IJ SOLN
INTRAMUSCULAR | Status: AC
Start: 2023-10-20 — End: ?
  Filled 2023-10-20: qty 2

## 2023-10-20 MED ORDER — ONDANSETRON HCL 4 MG/2ML IJ SOLN
INTRAMUSCULAR | Status: DC | PRN
  Administered 2023-10-20: 4 mg via INTRAVENOUS

## 2023-10-20 MED ORDER — DEXTROSE 10 % IV SOLN
INTRAVENOUS | Status: AC
  Filled 2023-10-20: qty 1000

## 2023-10-20 MED ORDER — LIDOCAINE 2% (20 MG/ML) 5 ML SYRINGE
INTRAMUSCULAR | Status: DC | PRN
  Administered 2023-10-20: 40 mg via INTRAVENOUS

## 2023-10-20 NOTE — Progress Notes (Addendum)
 PROGRESS NOTE  Chase Combs:[id number] DOB: 06/04/1945 DOA: 10/09/2023 PCP: Ilsa Maltese, PA   LOS: 10 days   Brief narrative:  78 years old male with PMH significant for Left TKR on 10/01/2023 for osteoarthritis, CAD S/p MI s/p coronary stent x 2, who presented to [facility] with complaints of feeling unwell for 1 week since his total knee replacement.  He was noted to have distended abdomen and was tachycardic and tachypneic with distended abdomen and was hypoxic.    A CT abdomen/pelvis and CTA for Pulmonary emboli were done.  He was noted to have frequent PVCs and possible short runs of V Tach and Amiodarone  drip was started.  CT scan abd wihout definitive obstruction and CTA no Pulmonary emboli.  He was admitted for acute hypoxemic respiratory failure secondary to restricted ventilation due to markedly distended abdomen related to opiate induced ileus.  PICC line placed 5/24 and patient remains on TPN.   CT abdomen performed 5/25 with diffuse small bowel dilation without transition point and some concern for right lung aspiration.  Leukocytosis improving and GI following.  Assessment/Plan: Principal Problem:   Hypoxia Active Problems:   Ileus, postoperative (HCC)   Fever, unspecified   Leukocytosis   Hypokalemia   Acute diarrhea   Abnormal finding on GI tract imaging   Acute hypoxemic respiratory failure: Aspiration pneumonia. Likely secondary to above hypoventilation from distended abdomen with possible aspiration.  CT scan showed some signs of aspiration.  On Zosyn  IV empirically.  Currently on room air.  On  supplemental oxygen..  Will continue incentive spirometry.  Opioid Induced Ileus vs SBO Risk of Refeeding Syndrome Possible colitis NG tube and rectal tube in place.  Initially required Relistor  and Reglan .  Currently on docusate and Reglan  with MiraLAX .  Currently on TPN.  PICC line placed 10/16/2023. Colonic wall thickening was noted,  on Zosyn .  C.  difficile and GI panel negative.  GI following and patient underwent flexible sigmoidoscopy today with findings of internal hemorrhoids, diverticulosis in the left colon and congested mucosa in the entire examined colon.  Biopsies were taken.  Plan for clamping NG tube tomorrow morning between 5 and 6 then placed NG tube back to low intermittent suction.  If patient is vomiting then return NG tube to suction immediately.  Keep head of bed elevated at 30 degrees..  Encourage ambulation.  Follow GI recommendations.      CAD s/p CABG:  Frequent PVCs and NSVT HTN Currently on IV metoprolol .  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  Would benefit from lifestyle modification and weight loss as outpatient.   DVT prophylaxis: heparin  injection 5,000 Units Start: 10/10/23 1400 SCDs Start: 10/10/23 0437   Disposition: Home with home health likely in 2 to 3 days  Status is: Inpatient Remains inpatient appropriate because: pending clinical improvement, TPN,  ileus, status post sigmoidoscopy   Code Status:     Code Status: Full Code  Family Communication: None at bedside. I tried to reach out to the patient's spouse few times today but was unable to reach her.  Consultants: GI  Procedures: NG tube placement, rectal tube Flexible sigmoidoscopy 10/20/2023  Anti-infectives:  Zosyn  IV  Anti-infectives (From admission, onward)    Start     Dose/Rate Route Frequency Ordered Stop   10/18/23 0915  piperacillin -tazobactam (ZOSYN ) IVPB 3.375 g        3.375 g 12.5 mL/hr over 240 Minutes Intravenous Every 8 hours 10/18/23 0819     10/10/23 0600  piperacillin -tazobactam (ZOSYN ) IVPB 3.375 g  Status:  Discontinued        3.375 g 12.5 mL/hr over 240 Minutes Intravenous Every 8 hours 10/10/23 0445 10/11/23 0951   10/09/23 2345  vancomycin  (VANCOCIN ) 500 mg in sodium chloride  0.9 % 100 mL IVPB       Placed in "And" Linked Group   500 mg 100 mL/hr over 60 Minutes Intravenous  Once 10/09/23 2231 10/10/23 0103    10/09/23 2245  vancomycin  (VANCOCIN ) IVPB 1000 mg/200 mL premix       Placed in "And" Linked Group   1,000 mg 200 mL/hr over 60 Minutes Intravenous  Once 10/09/23 2231 10/09/23 2359   10/09/23 2215  vancomycin  (VANCOCIN ) IVPB 1000 mg/200 mL premix  Status:  Discontinued        1,000 mg 200 mL/hr over 60 Minutes Intravenous  Once 10/09/23 2212 10/09/23 2227   10/09/23 2215  piperacillin -tazobactam (ZOSYN ) IVPB 3.375 g        3.375 g 100 mL/hr over 30 Minutes Intravenous  Once 10/09/23 2212 10/09/23 2330      Subjective: Today, patient was seen and examined at bedside.  Patient seen for flexible sigmoidoscopy.  Still complains of mild intermittent abdominal discomfort.  No nausea vomiting fever chills.  Has had bowel movements.  Objective: Vitals:   10/20/23 1110 10/20/23 1115  BP: 128/77 121/75  Pulse: 90 91  Resp: (!) 22 (!) 23  Temp:    SpO2: 98% 98%    Intake/Output Summary (Last 24 hours) at 10/20/2023 1139 Last data filed at 10/20/2023 1038 Gross per 24 hour  Intake 200 ml  Output 3250 ml  Net -3050 ml   Filed Weights   10/17/23 0500 10/18/23 0500 10/19/23 0417  Weight: 95.2 kg 93 kg 95.8 kg   Body mass index is 31.19 kg/m.   Physical Exam:  GENERAL: Patient is alert awake and oriented. Not in obvious distress.  Obese build, NG tube in place.  On nasal cannula oxygen, HENT: No scleral pallor or icterus. Pupils equally reactive to light. Oral mucosa is moist NECK: is supple, no gross swelling noted. CHEST:   Diminished breath sounds bilaterally. CVS: S1 and S2 heard, no murmur. Regular rate and rhythm.  ABDOMEN: Soft, distended abdomen, bowel sounds are present.  EXTREMITIES: No edema.  PICC line in place. CNS: Cranial nerves are intact. No focal motor deficits. SKIN: warm and dry without rashes.  Data Review: I have personally reviewed the following laboratory data and studies,  CBC: Recent Labs  Lab 10/15/23 2149 10/16/23 1054 10/17/23 0300 10/18/23 0510  10/19/23 0011  WBC 10.2 16.5* 12.6* 7.3 7.1  HGB 10.2* 10.1* 9.7* 9.4* 9.7*  HCT 31.2* 30.3* 29.8* 29.9* 30.9*  MCV 91.5 89.9 91.7 93.7 93.4  PLT 520* 473* 443* 410* 427*   Basic Metabolic Panel: Recent Labs  Lab 10/14/23 1700 10/15/23 0611 10/16/23 1054 10/17/23 0300 10/17/23 1835 10/18/23 0510 10/19/23 0011 10/20/23 0330  NA 140 140 138 138 140 138 142  141 140  K 3.3* 3.2* 3.2* 3.3* 3.7 4.1 4.7  4.8 5.3*  CL 96* 99 97* 97* 101 103 107  108 112*  CO2 34* 32 31 33* 30 27 24  25 22   GLUCOSE 201* 238* 165* 164* 124* 203* 158*  158* 158*  BUN 29* 22 21 29* 26* 25* 24*  23 26*  CREATININE 0.70 0.68 0.99 0.85 0.66 0.66 0.71  0.70 0.74  CALCIUM  8.3* 8.3* 8.1* 8.4* 8.4* 8.5* 9.0  8.9  8.4*  MG 2.4 2.5* 2.0 2.6*  --  2.4 2.2 2.3  PHOS 2.8 2.7 2.9  --   --  3.8 4.1  --    Liver Function Tests: Recent Labs  Lab 10/14/23 0323 10/14/23 1700 10/15/23 0611 10/18/23 0510 10/19/23 0011  AST 25  --   --  49* 78*  ALT 34  --   --  58* 99*  ALKPHOS 71  --   --  85 115  BILITOT 0.9  --   --  0.7 0.6  PROT 5.1*  --   --  5.5* 5.8*  ALBUMIN 2.1* 2.1* 2.1* 2.1* 2.2*  2.1*   No results for input(s): "LIPASE", "AMYLASE" in the last 168 hours. No results for input(s): "AMMONIA" in the last 168 hours. Cardiac Enzymes: No results for input(s): "CKTOTAL", "CKMB", "CKMBINDEX", "TROPONINI" in the last 168 hours. BNP (last 3 results) Recent Labs    10/10/23 0619  BNP 373.7*    ProBNP (last 3 results) No results for input(s): "PROBNP" in the last 8760 hours.  CBG: Recent Labs  Lab 10/19/23 1211 10/19/23 1700 10/19/23 2024 10/20/23 0032 10/20/23 0406  GLUCAP 158* 137* 147* 139* 161*   Recent Results (from the past 240 hours)  C Difficile Quick Screen (NO PCR Reflex)     Status: None   Collection Time: 10/16/23 12:31 PM   Specimen: STOOL  Result Value Ref Range Status   C Diff antigen NEGATIVE NEGATIVE Final   C Diff toxin NEGATIVE NEGATIVE Final   C Diff  interpretation No C. difficile detected.  Final    Comment: Performed at [facility], [address], [place], [place] [address]     Studies: No results found.     Tupac Jeffus, MD  [facility] 10/20/2023  If 7PM-7AM, please contact night-coverage

## 2023-10-20 NOTE — Progress Notes (Signed)
 PHARMACY - TOTAL PARENTERAL NUTRITION CONSULT NOTE   Indication: Prolonged ileus  Patient Measurements: Weight: 95.8 kg (211 lb 3.2 oz)  Ht 69 in Body mass index is 31.19 kg/m. Usual Weight: 98.3 kg  Assessment:  [name] who presents with abdominal distention in the setting of prolonged ileus. Patient had surgery on his knee on May 9 and was taking oxycodone  at home for pain. Endorses that intake has declined significantly since his procedure. Prior to procedure he was eating 3 meals a day which consisted of coffee and toast for breakfast, a veggie platter and meat for lunch, and a dinner prepared by his wife. Does not remember intake after surgery very well but states he was eating very little as nothing was appealing to him.   Has been receiving Relistor , Reglan , and neostigmine in combination with SMOG enemas, however has persistent bowel dilation. No bowel sounds present on physical exam. Pharmacy has been consulted to initiate TPN in setting of prolonged ileus and minimal intake.   5/23 pm - unable to use PICC due to placement. TPN not given, D10 @ 8ml/hr overnight. 5/24 - New PICC placed and TPN started at 1000  Pt continues with ileus - multiple episodes of bilious emesis with significant liquid stool outpt. May need repeat CT and surgery consult.  Glucose / Insulin : CBGs <180 22 units of SSI utilized past 24 hours. rSSI and 30 units insulin  in TPN Electrolytes: Na 140- up, K 5.3- up (goal >4 with ileus), Mg 2.3- up (Goal >2 w/ ileus), Cl 112- up, CoCa 9.84 Renal: Scr <1, BUN 26 Hepatic: AlkPhos / Tbili wnl; ALT/AST 99/78. TG 186, albumin 2.2 Intake / Output; MIVF: 1400 mL FMS, 1550 ml NG output. UOP 0.47 ml/kg/hr + 2 charted, LBM 5/27 GI Imaging:  5/21 KUB: progressive adynamic ileus 5/23 Abd xray: unchanged diffuse gaseous small bowel distension, likely ileus  5/24 Abd xray: Severely dilated loops of air-filled small bowel throughout the mid abdomen with minimal colonic  gas 5/25 CT Abd: distended fluid-filled loops of small bowel w/ multiple gas fluid levels, most pronounced within jejunum. No abrupt transition points suggestive of obstruction- findings could reflect sequela of ileus or diarrheal illness.   GI Surgeries / Procedures:  5/28- sigmoidoscopy  Central access: PICC (placed 5/21), replaced 5/24 TPN start date: 5/21  Nutritional Goals: Goal TPN rate is 90 mL/hr (provides 121 g of protein and 2200 kcals per day)  RD Assessment: Estimated Needs Total Energy Estimated Needs: 2200-2400 Total Protein Estimated Needs: 110-130 grams Total Fluid Estimated Needs: >/= 2 L  Current Nutrition:  TPN  Plan:  Stop current TPN (High K- 5.3) and start D10 at 90 ml/hr Starting at 1800 on 5/28; TPN at goal rate of 90 mL/hr at 1800. This will provide 100% of pt's estimated needs. Electrolytes in TPN: Na 30mEq/L, K 50 mEq/L, Ca 27mEq/L, Mg 2 mEq/L, and Phos 47mmol/L. Cl:Ac maximize acetate for this bag- re-evaluate 5/29 Add standard MVI and trace elements to TPN Increase regular insulin  to 35 units daily in TPN Continue Resistant q4h SSI and adjust as needed  Monitor TPN labs on Mon/Thur - BMET at 1800 and then daily until TPN at goal rate   Marleta Simmer BS, PharmD, BCPS Clinical Pharmacist 10/20/2023 10:03 AM  Contact: [phone] after 3 PM  "Be curious, not judgmental..." -Rumalda Counter

## 2023-10-20 NOTE — Progress Notes (Signed)
 Events this Shift:  Patient had a 14 bt run of vtac. VS obtained. Patient with no complaint. On call MD notified. No new orders. Instructed to continue to give IV Metoprolol . Will continue to monitor this shift.

## 2023-10-20 NOTE — Progress Notes (Signed)
 PT Cancellation Note  Patient Details Name: AMEDEE CERRONE MRN: [id number] DOB: 02-07-1946   Cancelled Treatment:    Reason Eval/Treat Not Completed: (P) Patient at procedure or test/unavailable (pt off unit for Flexible Sigmoidoscopy.) Will continue efforts per PT plan of care as schedule permits.   Mariel Shope Rashida Ladouceur 10/20/2023, 8:13 AM

## 2023-10-20 NOTE — Op Note (Signed)
 [facility] Patient Name: Chase Combs Procedure Date : 10/20/2023 MRN: [id number] Attending MD: Roel Clarity. Dominic Friendly , MD, [id number] Date of Birth: 03/06/1946 CSN: [id number] Age: 78 Admit Type: Inpatient Procedure:                Flexible Sigmoidoscopy Indications:              Clinically significant diarrhea of unexplained                            origin, Abnormal CT of the GI tract Providers:                Ace Abu L. Dominic Friendly, MD, Nikki Barters RN, RN, Rinda Cheers, Technician Referring MD:              Medicines:                General Anesthesia Complications:            No immediate complications. Estimated Blood Loss:     Estimated blood loss was minimal. Procedure:                Pre-Anesthesia Assessment:                           - Prior to the procedure, a History and Physical                            was performed, and patient medications and                            allergies were reviewed. The patient's tolerance of                            previous anesthesia was also reviewed. The risks                            and benefits of the procedure and the sedation                            options and risks were discussed with the patient.                            All questions were answered, and informed consent                            was obtained. Prior Anticoagulants: The patient has                            taken SQ heparin , last dose was day of procedure.                            ASA Grade Assessment: IV - A patient with severe  systemic disease that is a constant threat to life.                            After reviewing the risks and benefits, the patient                            was deemed in satisfactory condition to undergo the                            procedure.                           After obtaining informed consent, the scope was                            passed under direct  vision. The CF-HQ190L ([id number])                            Olympus coloscope was introduced through the anus                            and advanced to the the left transverse colon. The                            flexible sigmoidoscopy was accomplished without                            difficulty. The patient tolerated the procedure                            well. The quality of the bowel preparation was fair. Scope In: 10:30:25 AM Scope Out: 10:37:47 AM Total Procedure Duration: 0 hours 7 minutes 22 seconds  Findings:      The perianal and digital rectal examinations were normal.      Multiple diverticula were found in the left colon. In a short segment of       the sigmoid colon (several centimeters), there was some associated       mucosal erythema      Congested/cobblestoned mucosa was found in the entire examined colon.       Biopsies were taken with a cold forceps for histology (from the distal       transverse and sigmoid).      Internal hemorrhoids were found.      The exam was otherwise without abnormality. Impression:               - Preparation of the colon was fair.                           - Diverticulosis in the left colon.                           - Congested mucosa in the entire examined colon.                            Biopsied.                           -  Internal hemorrhoids.                           - The examination was otherwise normal.                           No overt colitis as was suggested by CTAP.                            Visualized mucosal edema that is most likely due to                            hypoalbuminemia. Biopsies were taken to rule out                            microscopic colitis. Recommendation:           - Return patient to hospital ward for ongoing care.                           - Await pathology results.                           - Replace rectal tube and closely monitor and                            record I's and O's                            - Sips of water  and ice chips only (patient still                            has an ileus, though clinically improved on                            preprocedure examination today)                           Recommend clamping NG tube until tomorrow morning                            between 5 and 6 AM, then place NG tube back to low                            intermittent suction with a fresh canister. If                            patient develops vomiting, then return NG tube to                            suction immediately.                           Keep head of bed elevated at least 30 degrees at  all times to decrease the risk of aspiration. Procedure Code(s):        --- Professional ---                           [phone], Sigmoidoscopy, flexible; with biopsy, single                            or multiple Diagnosis Code(s):        --- Professional ---                           K64.8, Other hemorrhoids                           K63.89, Other specified diseases of intestine                           R19.7, Diarrhea, unspecified                           K57.30, Diverticulosis of large intestine without                            perforation or abscess without bleeding                           R93.3, Abnormal findings on diagnostic imaging of                            other parts of digestive tract CPT copyright 2022 American Medical Association. All rights reserved. The codes documented in this report are preliminary and upon coder review may  be revised to meet current compliance requirements. Vona Whiters L. Dominic Friendly, MD 10/20/2023 10:45:46 AM This report has been signed electronically. Number of Addenda: 0

## 2023-10-20 NOTE — Progress Notes (Signed)
 Physical Therapy Treatment Patient Details Name: Chase Combs MRN: [id number] DOB: [birth year] Today's Date: 10/20/2023   History of Present Illness 78 yo male presented to [facility] 10/09/23 for N/V and abdominal distension. Pt with ileus, short runs of Vtach, and hypoxic respiratory failure.  10/10/23 transfer to [facility]. PICC placed 5/21. Pt required NG tube placement. PMhx: 10/01/23 Lt TKA, CAD, coronary stent, obesity, HTN, prostate CA, OSA.    PT Comments  Pt with improved activity tolerance and able to amb in hallway with assist. Encouraged pt to continue to mobilize in order to be able to return home with wife at time of dc.     If plan is discharge home, recommend the following: A little help with walking and/or transfers;A little help with bathing/dressing/bathroom;Assist for transportation;Help with stairs or ramp for entrance;Assistance with cooking/housework   Can travel by private vehicle        Equipment Recommendations  None recommended by PT    Recommendations for Other Services       Precautions / Restrictions Precautions Precautions: Fall;Knee;Other (comment) Precaution Booklet Issued: No Recall of Precautions/Restrictions: Intact Precaution/Restrictions Comments: NG tube, PICC Restrictions LLE Weight Bearing Per Provider Order: Weight bearing as tolerated Other Position/Activity Restrictions: no pillow or bolster under bend of knee at rest, OK to have under ankle to promote L knee extension     Mobility  Bed Mobility Overal bed mobility: Needs Assistance Bed Mobility: Supine to Sit     Supine to sit: Min assist, HOB elevated     General bed mobility comments: Assist to pull trunk into sitting    Transfers Overall transfer level: Needs assistance Equipment used: Rolling walker (2 wheels) Transfers: Sit to/from Stand, Bed to chair/wheelchair/BSC Sit to Stand: Contact guard assist   Step pivot transfers: Contact guard assist             Ambulation/Gait Ambulation/Gait assistance: Contact guard assist Gait Distance (Feet): 100 Feet Assistive device: Rolling walker (2 wheels) Gait Pattern/deviations: Step-through pattern, Decreased stride length Gait velocity: decr Gait velocity interpretation: 1.31 - 2.62 ft/sec, indicative of limited community ambulator   General Gait Details: Assist for safety   Stairs             Wheelchair Mobility     Tilt Bed    Modified Rankin (Stroke Patients Only)       Balance Overall balance assessment: Needs assistance Sitting-balance support: No upper extremity supported Sitting balance-Leahy Scale: Fair     Standing balance support: Bilateral upper extremity supported, During functional activity, Reliant on assistive device for balance Standing balance-Leahy Scale: Poor Standing balance comment: RW and CGA for static standing                            Communication Communication Communication: Impaired Factors Affecting Communication: Hearing impaired  Cognition Arousal: Alert Behavior During Therapy: WFL for tasks assessed/performed   PT - Cognitive impairments: No apparent impairments                         Following commands: Intact      Cueing Cueing Techniques: Verbal cues  Exercises      General Comments General comments (skin integrity, edema, etc.): SpO2 97% on RA at rest and with amb. Left O2 off.      Pertinent Vitals/Pain Pain Assessment Pain Assessment: Faces Faces Pain Scale: Hurts a little bit Pain Location: lt hip Pain Descriptors /  Indicators: Grimacing, Guarding Pain Intervention(s): Limited activity within patient's tolerance    Home Living                          Prior Function            PT Goals (current goals can now be found in the care plan section) Acute Rehab PT Goals Patient Stated Goal: To go home Progress towards PT goals: Progressing toward goals    Frequency    Min  2X/week      PT Plan      Co-evaluation              AM-PAC PT "6 Clicks" Mobility   Outcome Measure  Help needed turning from your back to your side while in a flat bed without using bedrails?: A Little Help needed moving from lying on your back to sitting on the side of a flat bed without using bedrails?: A Little Help needed moving to and from a bed to a chair (including a wheelchair)?: A Little Help needed standing up from a chair using your arms (e.g., wheelchair or bedside chair)?: A Little Help needed to walk in hospital room?: A Little Help needed climbing 3-5 steps with a railing? : A Little 6 Click Score: 18    End of Session Equipment Utilized During Treatment: Gait belt Activity Tolerance: Patient tolerated treatment well Patient left: in chair;with call bell/phone within reach;with chair alarm set Nurse Communication: Mobility status;Other (comment) (Left O2 off) PT Visit Diagnosis: Difficulty in walking, not elsewhere classified (R26.2);Other abnormalities of gait and mobility (R26.89) Pain - Right/Left: Left Pain - part of body: Hip     Time: 9811-9147 PT Time Calculation (min) (ACUTE ONLY): 28 min  Charges:    $Gait Training: 23-37 mins PT General Charges $$ ACUTE PT VISIT: 1 Visit                     [facility] PT Acute Rehabilitation Services Office [phone]    [name] [facility] 10/20/2023, 4:22 PM

## 2023-10-20 NOTE — Plan of Care (Signed)

## 2023-10-20 NOTE — Progress Notes (Signed)
 MAR reflecting new bag of TPN hung at 1001. TPN disconnected and NOT infusing upon arrival to bedside for 1800 TPN change.  Line care performed, cap changed, new TPN hung at 1752.

## 2023-10-20 NOTE — Interval H&P Note (Signed)
 History and Physical Interval Note:  10/20/2023 9:57 AM  Chase Combs  has presented today for surgery, with the diagnosis of Diarrhea and abnormal colon on CT scan.  The various methods of treatment have been discussed with the patient and family. After consideration of risks, benefits and other options for treatment, the patient has consented to  Procedure(s): SIGMOIDOSCOPY, FLEXIBLE (N/A) as a surgical intervention.  The patient's history has been reviewed, patient examined, no change in status, stable for surgery.  I have reviewed the patient's chart and labs.  Questions were answered to the patient's satisfaction.    Patient's abdomen softer and less distended today.  Mildly tympanitic with more active bowel sounds than yesterday.  Kerby Pearson III

## 2023-10-20 NOTE — Progress Notes (Signed)
 OT Cancellation Note  Patient Details Name: Chase Combs MRN: [id number] DOB: 1945/07/12   Cancelled Treatment:    Reason Eval/Treat Not Completed: Patient at procedure or test/ unavailable (off unit)  Nancey Kreitz K, OTD, OTR/L SecureChat Preferred Acute Rehab [phone]   Antionette Kirks 10/20/2023, 8:31 AM

## 2023-10-20 NOTE — Anesthesia Procedure Notes (Signed)
 Procedure Name: Intubation Date/Time: 10/20/2023 10:23 AM  Performed by: Bennett Brass, CRNAPre-anesthesia Checklist: Patient identified, Emergency Drugs available, Suction available and Patient being monitored Patient Re-evaluated:Patient Re-evaluated prior to induction Oxygen Delivery Method: Circle system utilized Preoxygenation: Pre-oxygenation with 100% oxygen Induction Type: IV induction Ventilation: Mask ventilation without difficulty Laryngoscope Size: Miller and 2 Grade View: Grade II Tube type: Oral Tube size: 7.0 mm Number of attempts: 2 Airway Equipment and Method: Stylet and Oral airway Placement Confirmation: ETT inserted through vocal cords under direct vision, positive ETCO2 and breath sounds checked- equal and bilateral Secured at: 24 cm Tube secured with: Tape Dental Injury: Teeth and Oropharynx as per pre-operative assessment  Comments: Second attempt by Dr. Brain Cahill

## 2023-10-20 NOTE — Transfer of Care (Signed)
 Immediate Anesthesia Transfer of Care Note  Patient: Chase Combs  Procedure(s) Performed: Marlynn Singer  Patient Location: PACU and Endoscopy Unit  Anesthesia Type:General  Level of Consciousness: awake and alert   Airway & Oxygen Therapy: Patient Spontanous Breathing and Patient connected to face mask oxygen  Post-op Assessment: Report given to RN and Post -op Vital signs reviewed and stable  Post vital signs: Reviewed and stable  Last Vitals:  Vitals Value Taken Time  BP    Temp    Pulse    Resp    SpO2      Last Pain:  Vitals:   10/20/23 0751  TempSrc: Temporal  PainSc: 0-No pain      Patients Stated Pain Goal: 3 (10/17/23 2034)  Complications: No notable events documented.

## 2023-10-21 DIAGNOSIS — R7401 Elevation of levels of liver transaminase levels: Secondary | ICD-10-CM

## 2023-10-21 DIAGNOSIS — R0902 Hypoxemia: Secondary | ICD-10-CM | POA: Diagnosis not present

## 2023-10-21 DIAGNOSIS — R7989 Other specified abnormal findings of blood chemistry: Secondary | ICD-10-CM | POA: Diagnosis not present

## 2023-10-21 DIAGNOSIS — E43 Unspecified severe protein-calorie malnutrition: Secondary | ICD-10-CM

## 2023-10-21 DIAGNOSIS — R197 Diarrhea, unspecified: Secondary | ICD-10-CM | POA: Diagnosis not present

## 2023-10-21 DIAGNOSIS — K567 Ileus, unspecified: Secondary | ICD-10-CM | POA: Diagnosis not present

## 2023-10-21 LAB — COMPREHENSIVE METABOLIC PANEL WITH GFR
ALT: 91 U/L — ABNORMAL HIGH (ref 0–44)
AST: 45 U/L — ABNORMAL HIGH (ref 15–41)
Albumin: 2.2 g/dL — ABNORMAL LOW (ref 3.5–5.0)
Alkaline Phosphatase: 161 U/L — ABNORMAL HIGH (ref 38–126)
Anion gap: 8 (ref 5–15)
BUN: 20 mg/dL (ref 8–23)
CO2: 22 mmol/L (ref 22–32)
Calcium: 8.8 mg/dL — ABNORMAL LOW (ref 8.9–10.3)
Chloride: 109 mmol/L (ref 98–111)
Creatinine, Ser: 0.66 mg/dL (ref 0.61–1.24)
GFR, Estimated: >60 mL/min (ref >60)
Glucose, Bld: 152 mg/dL — ABNORMAL HIGH (ref 70–99)
Potassium: 4.8 mmol/L (ref 3.5–5.1)
Sodium: 139 mmol/L (ref 135–145)
Total Bilirubin: 0.4 mg/dL (ref 0.0–1.2)
Total Protein: 5.7 g/dL — ABNORMAL LOW (ref 6.5–8.1)

## 2023-10-21 LAB — CBC
HCT: 30 % — ABNORMAL LOW (ref 39.0–52.0)
Hemoglobin: 9.2 g/dL — ABNORMAL LOW (ref 13.0–17.0)
MCH: 29.5 pg (ref 26.0–34.0)
MCHC: 30.7 g/dL (ref 30.0–36.0)
MCV: 96.2 fL (ref 80.0–100.0)
Platelets: 451 10*3/uL — ABNORMAL HIGH (ref 150–400)
RBC: 3.12 MIL/uL — ABNORMAL LOW (ref 4.22–5.81)
RDW: 15.9 % — ABNORMAL HIGH (ref 11.5–15.5)
WBC: 7.5 10*3/uL (ref 4.0–10.5)
nRBC: 0 % (ref 0.0–0.2)

## 2023-10-21 LAB — MAGNESIUM: Magnesium: 1.9 mg/dL (ref 1.7–2.4)

## 2023-10-21 LAB — GLUCOSE, CAPILLARY
Glucose-Capillary: 149 mg/dL — ABNORMAL HIGH (ref 70–99)
Glucose-Capillary: 151 mg/dL — ABNORMAL HIGH (ref 70–99)
Glucose-Capillary: 140 mg/dL — ABNORMAL HIGH (ref 70–99)
Glucose-Capillary: 144 mg/dL — ABNORMAL HIGH (ref 70–99)
Glucose-Capillary: 134 mg/dL — ABNORMAL HIGH (ref 70–99)

## 2023-10-21 LAB — SURGICAL PATHOLOGY

## 2023-10-21 LAB — PHOSPHORUS: Phosphorus: 3.5 mg/dL (ref 2.5–4.6)

## 2023-10-21 MED ORDER — BOOST / RESOURCE BREEZE PO LIQD CUSTOM
1.0000 | Freq: Three times a day (TID) | ORAL | Status: DC
  Administered 2023-10-21 – 2023-10-25 (×8): 1 via ORAL

## 2023-10-21 MED ORDER — INSULIN ASPART 100 UNIT/ML IJ SOLN
0.0000 [IU] | Freq: Four times a day (QID) | INTRAMUSCULAR | Status: DC
  Administered 2023-10-21 – 2023-10-22 (×4): 3 [IU] via SUBCUTANEOUS

## 2023-10-21 MED ORDER — TRAVASOL 10 % IV SOLN
INTRAVENOUS | Status: AC
  Filled 2023-10-21: qty 1209.6

## 2023-10-21 NOTE — TOC Progression Note (Addendum)
 Transition of Care [facility]) - Progression Note    Patient Details  Name: Chase Combs MRN: [id number] Date of Birth: Oct 05, 1945  Transition of Care [facility]) CM/SW Contact  Dane Dung, RN Phone Number: 10/21/2023, 11:37 AM  Clinical Narrative:    CM met with the patient at the bedside to discuss TOC needs.  Patient is frustrated regarding his hospitalization and looks forward to getting better and going home.  NG tube remains clamped at this time. Rectal tube is in place.  Patient is up on the chair on room air at this time.  TPN  continues while patient is waiting for bowel function to return.  Occlusive dressing noted to left knee at this time - post Left knee replacement by Dr. Brunilda Capra prior to this admission.  Patient has DME at the home including RW, Rolator, Scooter, and lift chair at home.    Patient was offered Medicare choice regarding home health agency and patient deferred preference of agency to be discussed with his wife.  Wife will be at the bedside this afternoon and CM will follow up.  10/21/23 1418 - CM called and spoke with the patient's wife by phone and patient is active with [facility].  HH orders are in place for PT/ OT.  No other TOC needs at this time.          Expected Discharge Plan and Services                                               Social Determinants of Health (SDOH) Interventions SDOH Screenings   Food Insecurity: Patient Declined (10/11/2023)  Housing: Patient Declined (10/11/2023)  Transportation Needs: Patient Declined (10/11/2023)  Utilities: Patient Declined (10/11/2023)  Social Connections: Patient Declined (10/11/2023)  Tobacco Use: Low Risk  (10/20/2023)    Readmission Risk Interventions    10/21/2023   11:36 AM  Readmission Risk Prevention Plan  Transportation Screening Complete  PCP or Specialist Appt within 5-7 Days Complete  Home Care Screening Complete  Medication Review (RN CM) Complete

## 2023-10-21 NOTE — Progress Notes (Signed)
 [facility] [facility] Progress Note  Chief Complaint: Ileus and acute diarrhea  History:  No clinical events overnight He is understandably frustrated by his lack of progress and both the NG tube and rectal tube are uncomfortable. It is not entirely clear if the NG tube management orders that I placed after yesterday's procedure were completely followed.  I spoke with his nurse who has been on shift and 7 AM.  She notes that NG tube was clamped after yesterday's procedure and then was put back to suction sometime overnight.  She has not changed the suction canister since arriving today.  Because she was also uncertain on the tubes management, she contacted the hospitalist about 3 hours ago and was given orders to clamp the tube.  ROS: Cardiovascular: Denies chest pain Respiratory: Has cough, producing little or no sputum Urinary: Denies dysuria  Objective:   Current Facility-Administered Medications:    acetaminophen  (TYLENOL ) tablet 650 mg, 650 mg, Oral, Q6H PRN, Kerby Pearson III, MD, 650 mg at 10/16/23 8469   Chlorhexidine  Gluconate Cloth 2 % PADS 6 each, 6 each, Topical, Daily, Kerby Pearson III, MD, 6 each at 10/20/23 2129   docusate sodium  (COLACE) capsule 100 mg, 100 mg, Oral, BID PRN, Kerby Pearson III, MD   heparin  injection 5,000 Units, 5,000 Units, Subcutaneous, Q8H, Danis, Roel Clarity III, MD, 5,000 Units at 10/21/23 1236   insulin  aspart (novoLOG ) injection 0-20 Units, 0-20 Units, Subcutaneous, Q6H, Pokhrel, Laxman, MD, 4 Units at 10/21/23 1308   lidocaine  (LIDODERM ) 5 % 1 patch, 1 patch, Transdermal, Q24H, Danis, Roel Clarity III, MD, 1 patch at 10/20/23 2030   menthol -cetylpyridinium (CEPACOL) lozenge 3 mg, 1 lozenge, Oral, PRN, Kerby Pearson III, MD, 3 mg at 10/13/23 0224   metoCLOPramide  (REGLAN ) injection 10 mg, 10 mg, Intravenous, Q8H, Danis, Altair Appenzeller L III, MD, 10 mg at 10/21/23 1236   metoprolol  tartrate (LOPRESSOR ) injection 5 mg, 5 mg, Intravenous, Q6H, Danis, Haely Leyland L III, MD, 5 mg at  10/21/23 1237   Muscle Rub CREA, , Topical, PRN, Chen, Lydia D, RPH, 1 Application at 10/20/23 2231   ondansetron  (ZOFRAN ) injection 4 mg, 4 mg, Intravenous, Once, Danis, Cordelia Dessert, MD   Oral care mouth rinse, 15 mL, Mouth Rinse, PRN, Danis, Roel Clarity III, MD   pantoprazole (PROTONIX) injection 40 mg, 40 mg, Intravenous, QHS, Danis, Roel Clarity III, MD, 40 mg at 10/20/23 2131   piperacillin -tazobactam (ZOSYN ) IVPB 3.375 g, 3.375 g, Intravenous, Q8H, Danis, Buena Boehm L III, MD, Last Rate: 12.5 mL/hr at 10/21/23 1246, 3.375 g at 10/21/23 1246   polyethylene glycol (MIRALAX  / GLYCOLAX ) packet 17 g, 17 g, Oral, Daily PRN, Kerby Pearson III, MD   simethicone  (MYLICON) chewable tablet 80 mg, 80 mg, Oral, QID PRN, Kerby Pearson III, MD, 80 mg at 10/11/23 0026   sodium chloride  flush (NS) 0.9 % injection 10-40 mL, 10-40 mL, Intracatheter, Q12H, Danis, Rennee Coyne L III, MD, 10 mL at 10/21/23 0902   sodium chloride  flush (NS) 0.9 % injection 10-40 mL, 10-40 mL, Intracatheter, PRN, Danis, Cordelia Dessert, MD   TPN ADULT (ION), , Intravenous, Continuous TPN, Kerby Pearson III, MD, Last Rate: 90 mL/hr at 10/20/23 1832, Infusion Verify at 10/20/23 1832   TPN ADULT (ION), , Intravenous, Continuous TPN, Pokhrel, Laxman, MD   traMADol  (ULTRAM ) tablet 50 mg, 50 mg, Oral, Once, Danis, Roel Clarity III, MD   piperacillin -tazobactam (ZOSYN )  IV 3.375 g (10/21/23 1246)   TPN ADULT (ION) 90  mL/hr at 10/20/23 1832   TPN ADULT (ION)       Vital signs in last 24 hrs: Vitals:   10/21/23 0403 10/21/23 0753  BP: 129/78 136/72  Pulse: 79 79  Resp: 18 18  Temp: 98.2 F (36.8 C) 97.7 F (36.5 C)  SpO2: 95% 97%    Intake/Output Summary (Last 24 hours) at 10/21/2023 1545 Last data filed at 10/21/2023 1000 Gross per 24 hour  Intake 573.11 ml  Output 600 ml  Net -26.89 ml     Physical Exam Ill-appearing man.  NG tube in left nostril, rectal tube in draining dark liquid stool as before He is alert and conversational TPN is  running HEENT: sclera anicteric, oral mucosa without lesions Neck: supple, no thyromegaly, JVD or lymphadenopathy Cardiac: RRR without murmurs, S1S2 heard, no peripheral edema Pulm: clear to auscultation bilaterally, normal RR and effort noted Abdomen: Soft and slightly distended and tympanitic, no tenderness with more active bowel sounds than yesterday.  Skin; warm and dry, no jaundice  Recent Labs:     Latest Ref Rng & Units 10/21/2023    3:45 AM 10/19/2023   12:11 AM 10/18/2023    5:10 AM  CBC  WBC 4.0 - 10.5 K/uL 7.5  7.1  7.3   Hemoglobin 13.0 - 17.0 g/dL 9.2  9.7  9.4   Hematocrit 39.0 - 52.0 % 30.0  30.9  29.9   Platelets 150 - 400 K/uL 451  427  410     No results for input(s): "INR" in the last 168 hours.    Latest Ref Rng & Units 10/21/2023    3:45 AM 10/20/2023    3:30 AM 10/19/2023   12:11 AM  CMP  Glucose 70 - 99 mg/dL 102  725  366    440   BUN 8 - 23 mg/dL 20  26  23    24    Creatinine 0.61 - 1.24 mg/dL 3.47  4.25  9.56    3.87   Sodium 135 - 145 mmol/L 139  140  141    142   Potassium 3.5 - 5.1 mmol/L 4.8  5.3  4.8    4.7   Chloride 98 - 111 mmol/L 109  112  108    107   CO2 22 - 32 mmol/L 22  22  25    24    Calcium  8.9 - 10.3 mg/dL 8.8  8.4  8.9    9.0   Total Protein 6.5 - 8.1 g/dL 5.7   5.8   Total Bilirubin 0.0 - 1.2 mg/dL 0.4   0.6   Alkaline Phos 38 - 126 U/L 161   115   AST 15 - 41 U/L 45   78   ALT 0 - 44 U/L 91   99      Radiologic studies:   Assessment & Plan  Assessment: Refractory ileus, showing signs of improvement in last 24 to 36 hours  Still has acute diarrhea, no colitis seen on sigmoidoscopy.  Some nonspecific mucosal edema probably from his hypoalbuminemia.  Biopsies pending to rule out microscopic colitis, and he has rule out for infectious causes including C. difficile on 2 occasions  Acute severe protein calorie malnutrition from this recent illness, on TPN  Elevated LFTs, multifactorial from acute illness, medicines,  TPN  I think the Zosyn  and Reglan  are probably worsening his diarrhea.  I messaged the hospitalist to see if they would feel he has had a sufficient course of Zosyn  for  the pneumonia and could stop it now. I am going to stop the metoclopramide .  I also would like the NG tube kept clamped unless the patient begins vomiting, at which time it should then be put immediately back to low intermittent suction. Starting him on a clear liquid diet with some additional boost breeze liquid protein calorie supplements If no vomiting by the time I see him tomorrow, and if little NG tube output when I put it briefly to suction at that time, I will remove it.  Please continue as much PT and activity as this patient can do to help his ileus completely resolved.   40 minutes were spent on this encounter, including in depth chart review, independent review of results as outlined above, communicating results with the patient directly, face-to-face time with the patient, coordinating care, ordering studies and medications as appropriate, and documentation.     Kerby Pearson III Office: [phone]

## 2023-10-21 NOTE — Progress Notes (Signed)
 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  He was noted to have distended abdomen and was tachycardic and tachypneic with distended abdomen and was hypoxic.    A CT abdomen/pelvis and CTA for Pulmonary emboli were done.  He was noted to have frequent PVCs and possible short runs of V Tach and Amiodarone  drip was started.  CT scan abd wihout definitive obstruction and CTA no Pulmonary emboli.  He was admitted for acute hypoxemic respiratory failure secondary to restricted ventilation due to markedly distended abdomen related to opiate induced ileus.  PICC line placed 5/24 and patient remains on TPN.   CT abdomen performed 5/25 with diffuse small bowel dilation without transition point and some concern for right lung aspiration.   Assessment/Plan: Principal Problem:   Hypoxia Active Problems:   Ileus, postoperative (HCC)   Fever, unspecified   Leukocytosis   Hypokalemia   Acute diarrhea   Abnormal finding on GI tract imaging   Acute hypoxemic respiratory failure: Aspiration pneumonia. Likely secondary to above hypoventilation from distended abdomen with possible aspiration.  CT scan showed some signs of aspiration.  On Zosyn  IV empirically.  Currently on room air.  On  supplemental oxygen..  Will continue incentive spirometry.  Opioid Induced Ileus vs SBO Risk of Refeeding Syndrome Possible colitis NG tube and rectal tube in place.  Initially required Relistor  and Reglan .  Currently on docusate and Reglan  with MiraLAX .  Currently on TPN.  PICC line placed 10/16/2023. Colonic wall thickening was noted,  on Zosyn .  C. difficile and GI panel negative.  GI following  and patient underwent flexible sigmoidoscopy with findings of internal hemorrhoids, diverticulosis in the left colon and congested mucosa in the entire examined colon.  Biopsies were taken.  GI following.   Keep head of bed elevated at 30 degrees..  Encourage ambulation.  Follow GI recommendations.      CAD s/p CABG:  Frequent PVCs and NSVT HTN Currently on IV metoprolol .  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  Would benefit from lifestyle modification and weight loss as outpatient.   DVT prophylaxis: heparin  injection 5,000 Units Start: 10/10/23 1400 SCDs Start: 10/10/23 0437   Disposition: Home with home health likely in 2 to 3 days  Status is: Inpatient Remains inpatient appropriate because: pending clinical improvement, TPN,  ileus, status post sigmoidoscopy   Code Status:     Code Status: Full Code  Family Communication: None at bedside.   Consultants: GI  Procedures: NG tube placement, rectal tube Flexible sigmoidoscopy 10/20/2023  Anti-infectives:  Zosyn  IV  Anti-infectives (From admission, onward)    Start     Dose/Rate Route Frequency Ordered Stop   10/18/23 0915  piperacillin -tazobactam (ZOSYN ) IVPB 3.375 g        3.375 g 12.5 mL/hr over 240 Minutes Intravenous Every 8 hours 10/18/23 0819     10/10/23 0600  piperacillin -tazobactam (ZOSYN ) IVPB 3.375 g  Status:  Discontinued        3.375 g 12.5 mL/hr over 240 Minutes Intravenous Every 8 hours 10/10/23 0445 10/11/23 0951   10/09/23 2345  vancomycin  (VANCOCIN ) 500 mg in sodium chloride  0.9 % 100 mL IVPB  Placed in "And" Linked Group   500 mg 100 mL/hr over 60 Minutes Intravenous  Once 10/09/23 2231 10/10/23 0103   10/09/23 2245  vancomycin  (VANCOCIN ) IVPB 1000 mg/200 mL premix       Placed in "And" Linked Group   1,000 mg 200 mL/hr over 60 Minutes Intravenous  Once 10/09/23 2231 10/09/23 2359   10/09/23 2215  vancomycin  (VANCOCIN ) IVPB 1000 mg/200 mL premix  Status:  Discontinued        1,000 mg 200 mL/hr over  60 Minutes Intravenous  Once 10/09/23 2212 10/09/23 2227   10/09/23 2215  piperacillin -tazobactam (ZOSYN ) IVPB 3.375 g        3.375 g 100 mL/hr over 30 Minutes Intravenous  Once 10/09/23 2212 10/09/23 2330      Subjective: Today, patient was seen and examined at bedside.  Patient is upset that he is still in the hospital and not improving inquiring about oral intake.  Has mild abdominal discomfort but no nausea vomiting.  Has NG tube and rectal tube in place.    Objective: Vitals:   10/21/23 0403 10/21/23 0753  BP: 129/78 136/72  Pulse: 79 79  Resp: 18 18  Temp: 98.2 F (36.8 C) 97.7 F (36.5 C)  SpO2: 95% 97%    Intake/Output Summary (Last 24 hours) at 10/21/2023 1310 Last data filed at 10/21/2023 1000 Gross per 24 hour  Intake 573.11 ml  Output 600 ml  Net -26.89 ml   Filed Weights   10/19/23 0417 10/21/23 0500 10/21/23 1018  Weight: 95.8 kg 89.4 kg 89.4 kg   Body mass index is 29.12 kg/m.   Physical Exam:  GENERAL: Patient is alert awake and oriented. Not in obvious distress.  Obese build, NG tube in place.  On nasal cannula oxygen, HENT: No scleral pallor or icterus. Pupils equally reactive to light. Oral mucosa is moist NECK: is supple, no gross swelling noted. CHEST:   Diminished breath sounds bilaterally. CVS: S1 and S2 heard, no murmur. Regular rate and rhythm.  ABDOMEN: Soft, distended abdomen, nontender. EXTREMITIES: No edema.  PICC line in place. CNS: Cranial nerves are intact. No focal motor deficits. SKIN: warm and dry without rashes.  Data Review: I have personally reviewed the following laboratory data and studies,  CBC: Recent Labs  Lab 10/16/23 1054 10/17/23 0300 10/18/23 0510 10/19/23 0011 10/21/23 0345  WBC 16.5* 12.6* 7.3 7.1 7.5  HGB 10.1* 9.7* 9.4* 9.7* 9.2*  HCT 30.3* 29.8* 29.9* 30.9* 30.0*  MCV 89.9 91.7 93.7 93.4 96.2  PLT 473* 443* 410* 427* 451*   Basic Metabolic Panel: Recent Labs  Lab 10/15/23 0611 10/16/23 1054  10/17/23 0300 10/17/23 1835 10/18/23 0510 10/19/23 0011 10/20/23 0330 10/21/23 0345  NA 140 138 138 140 138 142  141 140 139  K 3.2* 3.2* 3.3* 3.7 4.1 4.7  4.8 5.3* 4.8  CL 99 97* 97* 101 103 107  108 112* 109  CO2 32 31 33* 30 27 24  25 22 22   GLUCOSE 238* 165* 164* 124* 203* 158*  158* 158* 152*  BUN 22 21 29* 26* 25* 24*  23 26* 20  CREATININE 0.68 0.99 0.85 0.66 0.66 0.71  0.70 0.74 0.66  CALCIUM  8.3* 8.1* 8.4* 8.4* 8.5* 9.0  8.9 8.4* 8.8*  MG 2.5* 2.0 2.6*  --  2.4 2.2 2.3 1.9  PHOS 2.7 2.9  --   --  3.8 4.1  --  3.5   Liver Function Tests: Recent Labs  Lab 10/14/23 1700 10/15/23  4132 10/18/23 0510 10/19/23 0011 10/21/23 0345  AST  --   --  49* 78* 45*  ALT  --   --  58* 99* 91*  ALKPHOS  --   --  85 115 161*  BILITOT  --   --  0.7 0.6 0.4  PROT  --   --  5.5* 5.8* 5.7*  ALBUMIN 2.1* 2.1* 2.1* 2.2*  2.1* 2.2*   No results for input(s): "LIPASE", "AMYLASE" in the last 168 hours. No results for input(s): "AMMONIA" in the last 168 hours. Cardiac Enzymes: No results for input(s): "CKTOTAL", "CKMB", "CKMBINDEX", "TROPONINI" in the last 168 hours. BNP (last 3 results) Recent Labs    10/10/23 0619  BNP 373.7*    ProBNP (last 3 results) No results for input(s): "PROBNP" in the last 8760 hours.  CBG: Recent Labs  Lab 10/20/23 2002 10/20/23 2352 10/21/23 0400 10/21/23 0836 10/21/23 1217  GLUCAP 132* 137* 149* 151* 140*   Recent Results (from the past 240 hours)  C Difficile Quick Screen (NO PCR Reflex)     Status: None   Collection Time: 10/16/23 12:31 PM   Specimen: STOOL  Result Value Ref Range Status   C Diff antigen NEGATIVE NEGATIVE Final   C Diff toxin NEGATIVE NEGATIVE Final   C Diff interpretation No C. difficile detected.  Final    Comment: Performed at [facility], [address], [place], [place] [address]     Studies: No results found.     Rosena Conradi, MD  [facility] 10/21/2023  If 7PM-7AM, please contact  night-coverage

## 2023-10-21 NOTE — Care Management Important Message (Signed)
 Important Message  Patient Details  Name: Chase Combs MRN: [id number] Date of Birth: 1945/09/22   Important Message Given:        Wynonia Hedges 10/21/2023, 3:13 PM

## 2023-10-21 NOTE — Progress Notes (Signed)
 Occupational Therapy Treatment Patient Details Name: Chase Combs MRN: [id number] DOB: [birth year] Today's Date: 10/21/2023   History of present illness 78 yo male presented to [facility] 10/09/23 for N/V and abdominal distension. Pt with ileus, short runs of Vtach, and hypoxic respiratory failure.  10/10/23 transfer to [facility]. PICC placed 5/21. Pt required NG tube placement. PMhx: 10/01/23 Lt TKA, CAD, coronary stent, obesity, HTN, prostate CA, OSA.   OT comments  Pt c/o minimal pain, feeling good overall. Pt able to fully participate in therapy, CGA for transfers and ambulation around room with RW, help with line management only. Pt able to stand unsupported to use urinal, no LOB. Pt reports he has sock aide at home and knows how to use it, has all DME needed, denies need for [facility]. Pt able to sit EOB and complete UB ADLs with set up, min A for UB dressing, max A for LB dressing. Pt able to perform sit to supine with supervision for safety with line management. HHOT follow up still recommended, will continue to follow Pt acutely to progress as able and maximize independence with ADLs.       If plan is discharge home, recommend the following:  Assistance with cooking/housework;Direct supervision/assist for medications management;Assist for transportation;Help with stairs or ramp for entrance;Direct supervision/assist for financial management;A lot of help with bathing/dressing/bathroom;A lot of help with walking and/or transfers   Equipment Recommendations  None recommended by OT    Recommendations for Other Services      Precautions / Restrictions Precautions Precautions: Fall;Knee;Other (comment) Precaution Booklet Issued: No Recall of Precautions/Restrictions: Intact Precaution/Restrictions Comments: NG tube, PICC, fecal management system Required Braces or Orthoses: Knee Immobilizer - Left Knee Immobilizer - Left: Discontinue once straight leg raise with < 10 degree  lag Restrictions Weight Bearing Restrictions Per Provider Order: Yes RLE Weight Bearing Per Provider Order: Weight bearing as tolerated LLE Weight Bearing Per Provider Order: Weight bearing as tolerated Other Position/Activity Restrictions: no pillow or bolster under bend of knee at rest, OK to have under ankle to promote L knee extension       Mobility Bed Mobility Overal bed mobility: Needs Assistance Bed Mobility: Sit to Supine       Sit to supine: Supervision   General bed mobility comments: supervision for safety    Transfers Overall transfer level: Needs assistance Equipment used: Rolling walker (2 wheels) Transfers: Sit to/from Stand, Bed to chair/wheelchair/BSC Sit to Stand: Contact guard assist     Step pivot transfers: Contact guard assist     General transfer comment: CGA for safety     Balance Overall balance assessment: Needs assistance Sitting-balance support: No upper extremity supported Sitting balance-Leahy Scale: Fair     Standing balance support: No upper extremity supported, During functional activity Standing balance-Leahy Scale: Fair Standing balance comment: able to static stand unsupported using urinal, no LOB                           ADL either performed or assessed with clinical judgement   ADL Overall ADL's : Needs assistance/impaired Eating/Feeding: NPO               Upper Body Dressing : Minimal assistance;Sitting   Lower Body Dressing: Maximal assistance;Sit to/from stand   Toilet Transfer: Contact guard assist;Rolling walker (2 wheels)   Toileting- Clothing Manipulation and Hygiene: Set up;Sit to/from stand (with urinal) Toileting - Clothing Manipulation Details (indicate cue type and reason): with urinal  Functional mobility during ADLs: Contact guard assist;Rolling walker (2 wheels) General ADL Comments: Pt CGA for transfers with RW, stands unsupported with urinal to complete toileting with set up.     Extremity/Trunk Assessment Upper Extremity Assessment Upper Extremity Assessment: Overall WFL for tasks assessed RUE Deficits / Details: hx of R soulder injury, shoudler flexion 70 deg roughly. other joints WFL> RUE Coordination: decreased gross motor            Vision       Perception     Praxis     Communication Communication Communication: No apparent difficulties Factors Affecting Communication: Hearing impaired   Cognition Arousal: Alert Behavior During Therapy: WFL for tasks assessed/performed Cognition: No apparent impairments                               Following commands: Intact        Cueing   Cueing Techniques: Verbal cues  Exercises      Shoulder Instructions       General Comments on room air during session, VSS despite pt c/o dizziness (more in L eye) - also reports this has been ongoing since admission    Pertinent Vitals/ Pain       Pain Assessment Pain Assessment: 0-10 Pain Score: 1  Pain Location: L hip Pain Descriptors / Indicators: Discomfort Pain Intervention(s): Monitored during session  Home Living                                          Prior Functioning/Environment              Frequency  Min 2X/week        Progress Toward Goals  OT Goals(current goals can now be found in the care plan section)  Progress towards OT goals: Progressing toward goals  Acute Rehab OT Goals Patient Stated Goal: to improve activity tolerance OT Goal Formulation: With patient Time For Goal Achievement: 10/30/23 Potential to Achieve Goals: Fair ADL Goals Pt Will Perform Grooming: with set-up;standing Pt Will Perform Upper Body Dressing: with min assist;sitting Pt Will Perform Lower Body Dressing: with min assist;sitting/lateral leans;sit to/from stand Pt Will Transfer to Toilet: with min assist;ambulating;regular height toilet Pt Will Perform Toileting - Clothing Manipulation and hygiene: with  supervision;sitting/lateral leans Pt/caregiver will Perform Home Exercise Program: Increased strength;Both right and left upper extremity;With theraband;Independently;With written HEP provided Additional ADL Goal #1: Pt will demonstrate understanding of 2 pressure relief strategies to reduce onset of wounds Additional ADL Goal #2: Pt will complete bed mobility assessement to determine capacity for EOB/OOB activity  Plan      Co-evaluation                 AM-PAC OT "6 Clicks" Daily Activity     Outcome Measure   Help from another person eating meals?: Total Help from another person taking care of personal grooming?: A Little Help from another person toileting, which includes using toliet, bedpan, or urinal?: A Lot Help from another person bathing (including washing, rinsing, drying)?: A Lot Help from another person to put on and taking off regular upper body clothing?: A Little Help from another person to put on and taking off regular lower body clothing?: A Lot 6 Click Score: 13    End of Session Equipment Utilized During Treatment: Gait belt;Rolling walker (2  wheels)  OT Visit Diagnosis: Muscle weakness (generalized) (M62.81);Other abnormalities of gait and mobility (R26.89);Pain Pain - Right/Left: Left Pain - part of body: Knee   Activity Tolerance Patient tolerated treatment well   Patient Left in bed;with call bell/phone within reach   Nurse Communication Mobility status        Time: 6578-4696 OT Time Calculation (min): 26 min  Charges: OT General Charges $OT Visit: 1 Visit OT Treatments $Self Care/Home Management : 8-22 mins $Therapeutic Activity: 8-22 mins  Emmaclaire Switala, OTR/L   Genola Yuille R Rashon Rezek 10/21/2023, 1:02 PM

## 2023-10-21 NOTE — Plan of Care (Signed)

## 2023-10-21 NOTE — Progress Notes (Signed)
 Physical Therapy Treatment Patient Details Name: Chase Combs MRN: [id number] DOB: 1945/10/05 Today's Date: 10/21/2023   History of Present Illness 78 yo male presented to [facility] ED 10/09/23 for N/V and abdominal distension. Pt with ileus, short runs of Vtach, and hypoxic respiratory failure.  10/10/23 transfer to [facility]. PICC placed 5/21. Pt required NG tube placement. PMhx: 10/01/23 Lt TKA, CAD, coronary stent, obesity, HTN, prostate CA, OSA.    PT Comments  Pt seen for PT tx with pt agreeable to tx. Pt requires min assist for supine>sit, close supervision for sit<>stand from EOB & recliner with extra time to power up to standing. Pt is able to walk to door & back with RW & CGA with gait pattern as noted below. Gait tolerance limited by dizziness throughout session although pt reports this has been ongoing since admission & VSS. PT educated pt on importance of promoting L knee extension at rest, educated him on need to perform LLE quad sets & placed pillow under distal LLE to further promote extension. Will continue to follow pt acutely & progress mobility as able.    If plan is discharge home, recommend the following: A little help with walking and/or transfers;A little help with bathing/dressing/bathroom;Assist for transportation;Help with stairs or ramp for entrance;Assistance with cooking/housework   Can travel by private vehicle        Equipment Recommendations  None recommended by PT    Recommendations for Other Services       Precautions / Restrictions Precautions Precautions: Fall;Knee;Other (comment) Precaution/Restrictions Comments: NG tube, PICC, fecal management system Restrictions Weight Bearing Restrictions Per Provider Order: Yes LLE Weight Bearing Per Provider Order: Weight bearing as tolerated Other Position/Activity Restrictions: no pillow or bolster under bend of knee at rest, OK to have under ankle to promote L knee extension     Mobility  Bed Mobility Overal  bed mobility: Needs Assistance Bed Mobility: Supine to Sit     Supine to sit: Min assist, HOB elevated, Used rails (cuing to use bed rails but strongly requests to hold to PT's hand to pull himself up to sitting, extra time, exits L side of bed)          Transfers Overall transfer level: Needs assistance Equipment used: Rolling walker (2 wheels) Transfers: Sit to/from Stand Sit to Stand: Supervision   Step pivot transfers: Contact guard assist (bed>recliner on L)       General transfer comment: sit>stand from low EOB, recliner, does well at pushing to standing with extra time to power up    Ambulation/Gait Ambulation/Gait assistance: Contact guard assist Gait Distance (Feet): 28 Feet Assistive device: Rolling walker (2 wheels) Gait Pattern/deviations: Decreased step length - left, Decreased step length - right, Decreased stride length Gait velocity: decreased     General Gait Details: decreased LLE terminal knee extension in stance phase despite cuing to focus on it if possible, decreased heel strike BLE, decreased step length BLE   Stairs             Wheelchair Mobility     Tilt Bed    Modified Rankin (Stroke Patients Only)       Balance Overall balance assessment: Needs assistance Sitting-balance support: No upper extremity supported Sitting balance-Leahy Scale: Fair Sitting balance - Comments: supervision static sitting EOB   Standing balance support: Bilateral upper extremity supported, Reliant on assistive device for balance, During functional activity Standing balance-Leahy Scale: Fair  Communication Communication Communication: Impaired Factors Affecting Communication: Hearing impaired  Cognition Arousal: Alert Behavior During Therapy: WFL for tasks assessed/performed   PT - Cognitive impairments: No apparent impairments                       PT - Cognition Comments: irritable re:  situation Following commands: Intact      Cueing Cueing Techniques: Verbal cues  Exercises Total Joint Exercises Quad Sets: AROM, Left, 5 reps    General Comments General comments (skin integrity, edema, etc.): on room air during session, VSS despite pt c/o dizziness (more in L eye) - also reports this has been ongoing since admission      Pertinent Vitals/Pain Pain Assessment Pain Assessment: Faces Faces Pain Scale: Hurts little more Pain Location: L hip Pain Descriptors / Indicators: Discomfort Pain Intervention(s): Monitored during session, Limited activity within patient's tolerance    Home Living                          Prior Function            PT Goals (current goals can now be found in the care plan section) Acute Rehab PT Goals Patient Stated Goal: To go home PT Goal Formulation: With patient Time For Goal Achievement: 10/27/23 Potential to Achieve Goals: Good Progress towards PT goals: Progressing toward goals    Frequency    Min 2X/week      PT Plan      Co-evaluation              AM-PAC PT "6 Clicks" Mobility   Outcome Measure  Help needed turning from your back to your side while in a flat bed without using bedrails?: None Help needed moving from lying on your back to sitting on the side of a flat bed without using bedrails?: A Lot Help needed moving to and from a bed to a chair (including a wheelchair)?: A Little Help needed standing up from a chair using your arms (e.g., wheelchair or bedside chair)?: A Little Help needed to walk in hospital room?: A Little Help needed climbing 3-5 steps with a railing? : A Little 6 Click Score: 18    End of Session   Activity Tolerance: Patient limited by fatigue (limited by dizziness) Patient left: in chair;with chair alarm set;with call bell/phone within reach Nurse Communication: Mobility status PT Visit Diagnosis: Difficulty in walking, not elsewhere classified (R26.2);Other  abnormalities of gait and mobility (R26.89);Pain Pain - Right/Left: Left Pain - part of body: Hip     Time: 1610-9604 PT Time Calculation (min) (ACUTE ONLY): 24 min  Charges:    $Therapeutic Activity: 23-37 mins PT General Charges $$ ACUTE PT VISIT: 1 Visit                     Emaline Handsome, PT, DPT 10/21/23, 10:30 AM   Venetta Gill 10/21/2023, 10:28 AM

## 2023-10-21 NOTE — Progress Notes (Signed)
 Nutrition Follow-up  DOCUMENTATION CODES:   Not applicable  INTERVENTION:   TPN per pharmacy to meet 100% of estimated nutritional needs. Pharmacy to add MVI to TPN Monitor for diet advancement   NUTRITION DIAGNOSIS:   Inadequate oral intake related to inability to eat as evidenced by NPO status.  - Still applicable   GOAL:   Patient will meet greater than or equal to 90% of their needs  - Meeting via TPN  MONITOR:   Weight trends, Labs, I & O's, Diet advancement  REASON FOR ASSESSMENT:   Consult Assessment of nutrition requirement/status, New TPN/TNA  ASSESSMENT:   78 y.o. male presented to the ED with SOB. 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 PMH includes CAD, HTN, OSA, and malignant neoplasm of prostate. Pt admitted with acute respiratory failure, Ileus, nasuea, vomiting, and gastroenteritis.  5/18 - Admitted; diet advanced to clear liquids 5/20 - NPO; NGT - LIWS 5/21 - TPN started 5/23 pm - unable to use PICC due to placement. TPN not given, D10 @ 32ml/hr overnight. 5/24 - New PICC placed and TPN started at 1000 5/26 - CT scan showed diffuse small bowel dilation. Numerous air-fluid levels. No colonic dilation, but diffuse colon wall thickening suggestive of colitis,  Patchy airspace disease in the right lung, suggestive of aspiration.  5/28 - s/p sigmoidoscopy findings of internal hemorrhoids, diverticulosis, and congested mucosa in colon 5/29 - NGT clamping trials    Pt continues to be NPO, TPN continuing. Pt still with prolonged ileus and profound liquid stool from FMS, only 425 ml yesterday. Negative for GI PCR pathogen panel and 2 negative C. Diffcile tests.   Sigmoidoscopy with findings of internal hemorrhoids, diverticulosis, and congested mucosa in colon. Per MD, plan for clamping trials of NGT today. NGT with 150 ml output charted x 24 hours. Pt still with mild abdominal discomfort, wants to know when he can eat. Is eating ice chips. Pt continuing to  ambulate with PT/OT.    Per pharmacy:  TPN stopped yesterday d/t high K. D10 was started at 90 ml/hr. New bag hung last night.   TPN at goal rate of 90 mL/hr at 1800. This will provide 100% of pt's estimated needs.   Admit weight: 94.3 kg  Current weight: 89.4 kg     Intake/Output Summary (Last 24 hours) at 10/21/2023 0818 Last data filed at 10/21/2023 0651 Gross per 24 hour  Intake 773.11 ml  Output 575 ml  Net 198.11 ml   Drains/Lines: NGT: 150 ml  output x 24 hours   Nutritionally Relevant Medications: Scheduled Meds:  Chlorhexidine  Gluconate Cloth  6 each Topical Daily   heparin   5,000 Units Subcutaneous Q8H   insulin  aspart  0-20 Units Subcutaneous Q4H   lidocaine   1 patch Transdermal Q24H   metoCLOPramide  (REGLAN ) injection  10 mg Intravenous Q8H   metoprolol  tartrate  5 mg Intravenous Q6H   ondansetron  (ZOFRAN ) IV  4 mg Intravenous Once   pantoprazole  (PROTONIX ) IV  40 mg Intravenous QHS   sodium chloride  flush  10-40 mL Intracatheter Q12H   traMADol   50 mg Oral Once   Continuous Infusions:  piperacillin -tazobactam (ZOSYN )  IV 3.375 g (10/21/23 0611)   TPN ADULT (ION) 90 mL/hr at 10/20/23 1832    Labs Reviewed: Calcium  8.8 AP 161 Albumin 2.2 AST 45 ALT 91 Total protein 5.7 TG 186 CBG ranges from 115-161 mg/dL over the last 24 hours HgbA1c 6.4  Diet Order:   Diet Order  Diet NPO time specified Except for: Sips with Meds  Diet effective midnight                   EDUCATION NEEDS:   Education needs have been addressed  Skin:  Skin Assessment: Reviewed RN Assessment  Last BM:  5/8 - type 7, 425 ml x 24 hours via FMS  Height:   Ht Readings from Last 1 Encounters:  10/01/23 5\' 9"  (1.753 m)    Weight:   Wt Readings from Last 1 Encounters:  10/21/23 89.4 kg    Ideal Body Weight:  72.7 kg  BMI:  Body mass index is 29.12 kg/m.  Estimated Nutritional Needs:   Kcal:  2200-2400  Protein:  110-130 grams  Fluid:  >/= 2  L   Frederik Jansky, RD Registered Dietitian  See Amion for more information

## 2023-10-21 NOTE — Progress Notes (Signed)
 Per verbal order from MD, NG tube is clamped at 1300. Patient tolerating well, no nausea or vomiting. GI following progress. Suction is set up at bedside incase patient starts to vomit per GI note.

## 2023-10-21 NOTE — Progress Notes (Signed)
 PHARMACY - TOTAL PARENTERAL NUTRITION CONSULT NOTE   Indication: Prolonged ileus  Patient Measurements: Weight: 89.4 kg (197 lb 3.2 oz)  Ht 69 in Body mass index is 29.12 kg/m. Usual Weight: 98.3 kg  Assessment:  [name] who presents with abdominal distention in the setting of prolonged ileus. Patient had surgery on his knee on May 9 and was taking oxycodone  at home for pain. Endorses that intake has declined significantly since his procedure. Prior to procedure he was eating 3 meals a day which consisted of coffee and toast for breakfast, a veggie platter and meat for lunch, and a dinner prepared by his wife. Does not remember intake after surgery very well but states he was eating very little as nothing was appealing to him.   Has been receiving Relistor , Reglan , and neostigmine in combination with SMOG enemas, however has persistent bowel dilation. No bowel sounds present on physical exam. Pharmacy has been consulted to initiate TPN in setting of prolonged ileus and minimal intake.   5/23 - unable to use PICC due to placement. TPN not given, D10 @ 6ml/hr overnight. 5/24 - New PICC placed and TPN started at 1000  Pt continues with ileus - multiple episodes of bilious emesis with significant liquid stool outpt. Potassium up 5.3 on 5/28 AM. TPN held from 10AM-6PM, started D10 at 90 ml/hr. TPN with reduced potassium started 5/28 @6PM .  Glucose / Insulin : CBGs <180, 12units of SSI utilized past 24 hours. rSSI and 35 units insulin  in TPN Electrolytes: Na 140- up, K 4.8 (goal >4 with ileus), Mg 1.9 (Goal >2 w/ ileus), Cl 109, CoCa 9.84 Renal: Scr <1, BUN 26 Hepatic: AlkPhos / Tbili wnl; ALT/AST 99/78. TG 186, albumin 2.2 Intake / Output; MIVF: 1400 mL FMS, 1550 ml NG output. UOP 0.47 ml/kg/hr + 2 charted, LBM 5/27  GI Imaging:  5/21 KUB: progressive adynamic ileus 5/23 Abd xray: unchanged diffuse gaseous small bowel distension, likely ileus  5/24 Abd xray: Severely dilated loops of  air-filled small bowel throughout the mid abdomen with minimal colonic gas 5/25 CT Abd: distended fluid-filled loops of small bowel w/ multiple gas fluid levels, most pronounced within jejunum. No abrupt transition points suggestive of obstruction- findings could reflect sequela of ileus or diarrheal illness.  GI Surgeries / Procedures:  5/28- sigmoidoscopy  Central access: PICC (placed 5/21), replaced 5/24 TPN start date: 5/21  Nutritional Goals: Goal TPN rate is 90 mL/hr (provides 121 g of protein and 2200 kcals per day)  RD Assessment: Estimated Needs Total Energy Estimated Needs: 2200-2400 Total Protein Estimated Needs: 110-130 grams Total Fluid Estimated Needs: >/= 2 L  Current Nutrition:  TPN  Plan:  Continue TPN at goal rate of 90 mL/hr at 1800. This will provide 100% of pt's estimated needs. Electrolytes in TPN: Na 49mEq/L, K decrease to 40 mEq/L, Ca 5 mEq/L, Mg 2 mEq/L, and Phos 59mmol/L. Cl:Ac max acetate Add standard MVI and trace elements to TPN Increase regular insulin  in TPN to 40 units daily Change to Resistant q6h SSI and adjust as needed  Monitor TPN labs on Mon/Thur - BMET at 1800 and then daily until TPN at goal rate   Thank you for allowing pharmacy to be a part of this patient's care.  Claudia Cuff, PharmD, BCPS Clinical Pharmacist

## 2023-10-22 ENCOUNTER — Encounter (HOSPITAL_COMMUNITY): Payer: Self-pay | Admitting: Gastroenterology

## 2023-10-22 DIAGNOSIS — K567 Ileus, unspecified: Secondary | ICD-10-CM | POA: Diagnosis not present

## 2023-10-22 DIAGNOSIS — R197 Diarrhea, unspecified: Secondary | ICD-10-CM | POA: Diagnosis not present

## 2023-10-22 DIAGNOSIS — R0902 Hypoxemia: Secondary | ICD-10-CM | POA: Diagnosis not present

## 2023-10-22 LAB — BASIC METABOLIC PANEL WITH GFR
Anion gap: 8 (ref 5–15)
BUN: 16 mg/dL (ref 8–23)
CO2: 24 mmol/L (ref 22–32)
Calcium: 8.6 mg/dL — ABNORMAL LOW (ref 8.9–10.3)
Chloride: 105 mmol/L (ref 98–111)
Creatinine, Ser: 0.55 mg/dL — ABNORMAL LOW (ref 0.61–1.24)
GFR, Estimated: >60 mL/min (ref >60)
Glucose, Bld: 122 mg/dL — ABNORMAL HIGH (ref 70–99)
Potassium: 4.4 mmol/L (ref 3.5–5.1)
Sodium: 137 mmol/L (ref 135–145)

## 2023-10-22 LAB — GLUCOSE, CAPILLARY
Glucose-Capillary: 110 mg/dL — ABNORMAL HIGH (ref 70–99)
Glucose-Capillary: 122 mg/dL — ABNORMAL HIGH (ref 70–99)
Glucose-Capillary: 124 mg/dL — ABNORMAL HIGH (ref 70–99)
Glucose-Capillary: 128 mg/dL — ABNORMAL HIGH (ref 70–99)
Glucose-Capillary: 140 mg/dL — ABNORMAL HIGH (ref 70–99)

## 2023-10-22 MED ORDER — CHOLESTYRAMINE 4 G PO PACK
4.0000 g | PACK | Freq: Every day | ORAL | Status: DC
  Administered 2023-10-22 – 2023-10-23 (×2): 4 g via ORAL
  Filled 2023-10-22 (×2): qty 1

## 2023-10-22 MED ORDER — TRAVASOL 10 % IV SOLN
INTRAVENOUS | Status: AC
  Filled 2023-10-22: qty 1209.6

## 2023-10-22 MED ORDER — INSULIN ASPART 100 UNIT/ML IJ SOLN
0.0000 [IU] | Freq: Four times a day (QID) | INTRAMUSCULAR | Status: DC
  Administered 2023-10-22 – 2023-10-23 (×5): 2 [IU] via SUBCUTANEOUS
  Administered 2023-10-24: 3 [IU] via SUBCUTANEOUS
  Administered 2023-10-24: 2 [IU] via SUBCUTANEOUS
  Administered 2023-10-24: 5 [IU] via SUBCUTANEOUS
  Administered 2023-10-24: 2 [IU] via SUBCUTANEOUS
  Administered 2023-10-25: 3 [IU] via SUBCUTANEOUS
  Administered 2023-10-25 – 2023-10-26 (×3): 2 [IU] via SUBCUTANEOUS
  Administered 2023-10-26: 3 [IU] via SUBCUTANEOUS

## 2023-10-22 NOTE — Progress Notes (Addendum)
 PHARMACY - TOTAL PARENTERAL NUTRITION CONSULT NOTE   Indication: Prolonged ileus  Patient Measurements: Height: 5\' 9"  (175.3 cm) Weight: 89.4 kg (197 lb 3.2 oz) IBW/kg (Calculated) : 70.7 TPN AdjBW (KG): 75.4Ht 69 in Body mass index is 29.12 kg/m. Usual Weight: 98.3 kg  Assessment:  [name] who presents with abdominal distention in the setting of prolonged ileus. Patient had surgery on his knee on May 9 and was taking oxycodone  at home for pain. Endorses that intake has declined significantly since his procedure. Prior to procedure he was eating 3 meals a day which consisted of coffee and toast for breakfast, a veggie platter and meat for lunch, and a dinner prepared by his wife. Does not remember intake after surgery very well but states he was eating very little as nothing was appealing to him.   Has been receiving Relistor , Reglan , and neostigmine  in combination with SMOG enemas, however has persistent bowel dilation. No bowel sounds present on physical exam. Pharmacy has been consulted to initiate TPN in setting of prolonged ileus and minimal intake.   5/23 - unable to use PICC due to placement. TPN not given, D10 @ 63ml/hr overnight. 5/24 - New PICC placed and TPN started at 1000  Potassium up 5.3 on 5/28 AM. TPN held from 10AM-6PM, started D10 at 90 ml/hr. TPN with reduced potassium started 5/28 @6PM .   Glucose / Insulin : CBGs <180, 16 units of SSI utilized past 24 hours. rSSI and 40 units insulin  in TPN Electrolytes: Na 137, K 4.4 (goal >4 with ileus), Mg 1.9 (Goal >2 w/ ileus), Cl 105, CoCa ~10 Renal: Scr <1, BUN 26 Hepatic: AlkPhos up 161, Tbili wnl; AST/ALST 45/91. TG 186, albumin 2.2 Intake / Output; MIVF: 25 ml NG output. UOP 0.1 ml/kg/hr charted  GI Imaging:  5/21 KUB: progressive adynamic ileus 5/23 Abd xray: unchanged diffuse gaseous small bowel distension, likely ileus  5/24 Abd xray: Severely dilated loops of air-filled small bowel throughout the mid abdomen with  minimal colonic gas 5/25 CT Abd: distended fluid-filled loops of small bowel w/ multiple gas fluid levels, most pronounced within jejunum. No abrupt transition points suggestive of obstruction- findings could reflect sequela of ileus or diarrheal illness.  GI Surgeries / Procedures:  5/28- sigmoidoscopy  Central access: PICC (placed 5/21), replaced 5/24 TPN start date: 5/21  Nutritional Goals: Goal TPN rate is 90 mL/hr (provides 121 g of protein and 2200 kcals per day)  RD Assessment: Estimated Needs Total Energy Estimated Needs: 2200-2400 Total Protein Estimated Needs: 110-130 grams Total Fluid Estimated Needs: >/= 2 L  Current Nutrition:  5/21 TPN 5/29 LCD  Plan:  Continue TPN at goal rate of 90 mL/hr at 1800. This will provide 100% of pt's estimated needs. Electrolytes in TPN: Na 69mEq/L, K decrease to 40 mEq/L, Ca 5 mEq/L, Mg 2 mEq/L, and Phos 21mmol/L. Cl:Ac max acetate Add standard MVI and trace elements to TPN Continue regular insulin  in TPN to 40 units daily Change to Moderate q6h SSI and adjust as needed  Monitor TPN labs on Mon/Thurs, PRN  F/u toleration of diet, wean TPN as able   Thank you for allowing pharmacy to be a part of this patient's care.  Claudia Cuff, PharmD, BCPS Clinical Pharmacist

## 2023-10-22 NOTE — Progress Notes (Signed)
 Physical Therapy Treatment Patient Details Name: Chase Combs MRN: [id number] DOB: 02/11/1946 Today's Date: 10/22/2023   History of Present Illness 78 yo male presented to [facility] 10/09/23 for N/V and abdominal distension. Pt with ileus, short runs of Vtach, and hypoxic respiratory failure.  10/10/23 transfer to [facility]. PICC placed 5/21. Pt required NG tube placement. PMhx: 10/01/23 Lt TKA, CAD, coronary stent, obesity, HTN, prostate CA, OSA.    PT Comments  Pt seen for PT tx with pt agreeable. Pt with some improvement with mobility on this date, able to perform bed mobility with supervision, sit<>stand with supervision, and gait into hallway x 2 with RW & CGA. Pt presents with decreased endurance, requiring seated rest break between each gait trial. Pt requires ongoing cuing re: gait pattern with LLE. Will continue to follow pt acutely.   If plan is discharge home, recommend the following: A little help with walking and/or transfers;A little help with bathing/dressing/bathroom;Assist for transportation;Help with stairs or ramp for entrance;Assistance with cooking/housework   Can travel by private vehicle        Equipment Recommendations  None recommended by PT    Recommendations for Other Services       Precautions / Restrictions Precautions Precautions: Fall;Knee;Other (comment) Recall of Precautions/Restrictions: Intact Precaution/Restrictions Comments: NG tube, PICC, fecal management system Restrictions Weight Bearing Restrictions Per Provider Order: No LLE Weight Bearing Per Provider Order: Weight bearing as tolerated     Mobility  Bed Mobility Overal bed mobility: Needs Assistance Bed Mobility: Supine to Sit     Supine to sit: Supervision, HOB elevated, Used rails (exit L side of bed)          Transfers Overall transfer level: Needs assistance Equipment used: Rolling walker (2 wheels) Transfers: Sit to/from Stand Sit to Stand: Supervision           General  transfer comment: sit<>Stand from EOB, recliner, cuing to reach back for chair prior to sitting    Ambulation/Gait Ambulation/Gait assistance: Contact guard assist Gait Distance (Feet): 50 Feet (+ 50 ft) Assistive device: Rolling walker (2 wheels) Gait Pattern/deviations: Decreased step length - left, Decreased step length - right, Decreased stride length, Step-to pattern Gait velocity: decreased     General Gait Details: Pt maintains L knee flexed throughout gait, no terminal L knee extension in stance phase despite cuing.   Stairs             Wheelchair Mobility     Tilt Bed    Modified Rankin (Stroke Patients Only)       Balance Overall balance assessment: Needs assistance Sitting-balance support: No upper extremity supported Sitting balance-Leahy Scale: Fair     Standing balance support: During functional activity, No upper extremity supported, Reliant on assistive device for balance Standing balance-Leahy Scale: Fair Standing balance comment: able to stand to void with urinal with CGA                            Communication Communication Communication: No apparent difficulties Factors Affecting Communication: Hearing impaired  Cognition Arousal: Alert Behavior During Therapy: WFL for tasks assessed/performed   PT - Cognitive impairments: No apparent impairments                       PT - Cognition Comments: irritable re: situation Following commands: Intact      Cueing Cueing Techniques: Verbal cues  Exercises      General Comments  General comments (skin integrity, edema, etc.): Rectal tube leaking throughout session - nurse aware. Pt with c/o slight dizziness with mobility, reports it does not get worse with gait, reports it's better than yesterday.      Pertinent Vitals/Pain Pain Assessment Pain Assessment: Faces Faces Pain Scale: Hurts a little bit Pain Location: L hip Pain Descriptors / Indicators: Discomfort Pain  Intervention(s): Monitored during session, Limited activity within patient's tolerance    Home Living                          Prior Function            PT Goals (current goals can now be found in the care plan section) Acute Rehab PT Goals Patient Stated Goal: To go home PT Goal Formulation: With patient Time For Goal Achievement: 10/27/23 Potential to Achieve Goals: Good Progress towards PT goals: Progressing toward goals    Frequency    Min 2X/week      PT Plan      Co-evaluation              AM-PAC PT "6 Clicks" Mobility   Outcome Measure  Help needed turning from your back to your side while in a flat bed without using bedrails?: None Help needed moving from lying on your back to sitting on the side of a flat bed without using bedrails?: A Little Help needed moving to and from a bed to a chair (including a wheelchair)?: A Little Help needed standing up from a chair using your arms (e.g., wheelchair or bedside chair)?: A Little Help needed to walk in hospital room?: A Little Help needed climbing 3-5 steps with a railing? : A Little 6 Click Score: 19    End of Session   Activity Tolerance: Patient limited by fatigue;Patient tolerated treatment well Patient left: in chair;with call bell/phone within reach;with chair alarm set Nurse Communication: Mobility status PT Visit Diagnosis: Difficulty in walking, not elsewhere classified (R26.2);Other abnormalities of gait and mobility (R26.89);Pain Pain - Right/Left: Left Pain - part of body: Hip     Time: 8657-8469 PT Time Calculation (min) (ACUTE ONLY): 25 min  Charges:    $Therapeutic Activity: 23-37 mins PT General Charges $$ ACUTE PT VISIT: 1 Visit                     Emaline Handsome, PT, DPT 10/22/23, 11:50 AM   Venetta Gill 10/22/2023, 11:49 AM

## 2023-10-22 NOTE — Progress Notes (Signed)
 PROGRESS NOTE  Chase Combs FAO:[id number] DOB: [birth year] DOA: 10/09/2023 PCP: Ilsa Maltese, PA   LOS: 12 days   Brief narrative:  78 years old male with PMH significant for Left TKR on 10/01/2023 for osteoarthritis, CAD S/p MI s/p coronary stent x 2, who presented to [facility] with complaints of feeling unwell for 1 week since his total knee replacement.  He was noted to have distended abdomen and was tachycardic and tachypneic with distended abdomen and was hypoxic.    A CT abdomen/pelvis and CTA for Pulmonary emboli were done.  He was noted to have frequent PVCs and possible short runs of V Tach and Amiodarone  drip was started.  CT scan abd wihout definitive obstruction and CTA no Pulmonary emboli.  He was admitted for acute hypoxemic respiratory failure secondary to restricted ventilation due to markedly distended abdomen related to opiate induced ileus.  PICC line placed 5/24 and patient remains on TPN.   CT abdomen performed on 5/25 with diffuse small bowel dilation without transition point and some concern for right lung aspiration.   Assessment/Plan: Principal Problem:   Hypoxia Active Problems:   Ileus, postoperative (HCC)   Fever, unspecified   Leukocytosis   Hypokalemia   Acute diarrhea   Abnormal finding on GI tract imaging   Transaminitis   Acute hypoxemic respiratory failure: Aspiration pneumonia. Likely secondary to  hypoventilation from distended abdomen with possible aspiration.  CT scan showed some signs of aspiration.  Was on Zosyn  IV empirically which has been discontinued at this time..  Currently on room air.   continue incentive spirometry.  Opioid Induced Ileus vs SBO Risk of Refeeding Syndrome Possible colitis NG tube and rectal tube in place.  NG tube has been clamped at this time.  Initially required Relistor  and Reglan .  Currently off prokinetics and bowel regimen, on TPN.  PICC line placed 10/16/2023. Colonic wall thickening was noted, so  patient underwent flexible colonoscopy with findings of internal hemorrhoids diverticulosis and congested mucosa.  At this time Zosyn  has been discontinued..  Still has some loose stools on rectal tube.  Keep head of bed elevated at 30 degrees..  Encourage ambulation.  Follow GI recommendations.      CAD s/p CABG:  Frequent PVCs and NSVT HTN Currently on IV metoprolol .  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  Would benefit from lifestyle modification and weight loss as outpatient.   DVT prophylaxis: heparin  injection 5,000 Units Start: 10/10/23 1400 SCDs Start: 10/10/23 0437   Disposition: Home with home health likely in 1 to 2 days.  Status is: Inpatient Remains inpatient appropriate because: pending clinical improvement, TPN,  ileus, status post sigmoidoscopy   Code Status:     Code Status: Full Code  Family Communication: None at bedside.   Consultants: GI  Procedures: NG tube placement, rectal tube placement Flexible sigmoidoscopy 10/20/2023  Anti-infectives:  Zosyn  IV-completed  Anti-infectives (From admission, onward)    Start     Dose/Rate Route Frequency Ordered Stop   10/18/23 0915  piperacillin -tazobactam (ZOSYN ) IVPB 3.375 g  Status:  Discontinued        3.375 g 12.5 mL/hr over 240 Minutes Intravenous Every 8 hours 10/18/23 0819 10/21/23 1551   10/10/23 0600  piperacillin -tazobactam (ZOSYN ) IVPB 3.375 g  Status:  Discontinued        3.375 g 12.5 mL/hr over 240 Minutes Intravenous Every 8 hours 10/10/23 0445 10/11/23 0951   10/09/23 2345  vancomycin  (VANCOCIN ) 500 mg in sodium chloride  0.9 % 100  mL IVPB       Placed in "And" Linked Group   500 mg 100 mL/hr over 60 Minutes Intravenous  Once 10/09/23 2231 10/10/23 0103   10/09/23 2245  vancomycin  (VANCOCIN ) IVPB 1000 mg/200 mL premix       Placed in "And" Linked Group   1,000 mg 200 mL/hr over 60 Minutes Intravenous  Once 10/09/23 2231 10/09/23 2359   10/09/23 2215  vancomycin  (VANCOCIN ) IVPB 1000 mg/200 mL premix   Status:  Discontinued        1,000 mg 200 mL/hr over 60 Minutes Intravenous  Once 10/09/23 2212 10/09/23 2227   10/09/23 2215  piperacillin -tazobactam (ZOSYN ) IVPB 3.375 g        3.375 g 100 mL/hr over 30 Minutes Intravenous  Once 10/09/23 2212 10/09/23 2330      Subjective: Today, patient was seen and examined at bedside.  NG tube has been clamped since yesterday on clears but denies any nausea vomiting but has mild discomfort in the abdomen.  Still has loose stools.  No fever chills or rigor.  Objective: Vitals:   10/22/23 0527 10/22/23 0813  BP: 139/70 130/73  Pulse: 82 83  Resp: 20 19  Temp: 98.5 F (36.9 C) 98.1 F (36.7 C)  SpO2: 95% 96%    Intake/Output Summary (Last 24 hours) at 10/22/2023 0942 Last data filed at 10/22/2023 0550 Gross per 24 hour  Intake 2583.41 ml  Output 280 ml  Net 2303.41 ml   Filed Weights   10/19/23 0417 10/21/23 0500 10/21/23 1018  Weight: 95.8 kg 89.4 kg 89.4 kg   Body mass index is 29.12 kg/m.   Physical Exam:  GENERAL: Patient is alert awake and oriented. Not in obvious distress.  Obese build, NG tube in place.  Elderly male. HENT: No scleral pallor or icterus. Pupils equally reactive to light. Oral mucosa is moist NECK: is supple, no gross swelling noted. CHEST:   Diminished breath sounds bilaterally.  No obvious crackles or wheezes. CVS: S1 and S2 heard, no murmur. Regular rate and rhythm.  ABDOMEN: Soft, distended abdomen, nontender.  Rectal tube in place. EXTREMITIES: No edema.  PICC line in place. CNS: Cranial nerves are intact. No focal motor deficits. SKIN: warm and dry without rashes.  Data Review: I have personally reviewed the following laboratory data and studies,  CBC: Recent Labs  Lab 10/16/23 1054 10/17/23 0300 10/18/23 0510 10/19/23 0011 10/21/23 0345  WBC 16.5* 12.6* 7.3 7.1 7.5  HGB 10.1* 9.7* 9.4* 9.7* 9.2*  HCT 30.3* 29.8* 29.9* 30.9* 30.0*  MCV 89.9 91.7 93.7 93.4 96.2  PLT 473* 443* 410* 427* 451*    Basic Metabolic Panel: Recent Labs  Lab 10/16/23 1054 10/17/23 0300 10/17/23 1835 10/18/23 0510 10/19/23 0011 10/20/23 0330 10/21/23 0345 10/22/23 0459  NA 138 138   < > 138 142  141 140 139 137  K 3.2* 3.3*   < > 4.1 4.7  4.8 5.3* 4.8 4.4  CL 97* 97*   < > 103 107  108 112* 109 105  CO2 31 33*   < > 27 24  25 22 22 24   GLUCOSE 165* 164*   < > 203* 158*  158* 158* 152* 122*  BUN 21 29*   < > 25* 24*  23 26* 20 16  CREATININE 0.99 0.85   < > 0.66 0.71  0.70 0.74 0.66 0.55*  CALCIUM  8.1* 8.4*   < > 8.5* 9.0  8.9 8.4* 8.8* 8.6*  MG 2.0 2.6*  --  2.4 2.2 2.3 1.9  --   PHOS 2.9  --   --  3.8 4.1  --  3.5  --    < > = values in this interval not displayed.   Liver Function Tests: Recent Labs  Lab 10/18/23 0510 10/19/23 0011 10/21/23 0345  AST 49* 78* 45*  ALT 58* 99* 91*  ALKPHOS 85 115 161*  BILITOT 0.7 0.6 0.4  PROT 5.5* 5.8* 5.7*  ALBUMIN 2.1* 2.2*  2.1* 2.2*   No results for input(s): "LIPASE", "AMYLASE" in the last 168 hours. No results for input(s): "AMMONIA" in the last 168 hours. Cardiac Enzymes: No results for input(s): "CKTOTAL", "CKMB", "CKMBINDEX", "TROPONINI" in the last 168 hours. BNP (last 3 results) Recent Labs    10/10/23 0619  BNP 373.7*    ProBNP (last 3 results) No results for input(s): "PROBNP" in the last 8760 hours.  CBG: Recent Labs  Lab 10/21/23 1708 10/21/23 2024 10/22/23 0006 10/22/23 0519 10/22/23 0811  GLUCAP 144* 134* 140* 124* 122*   Recent Results (from the past 240 hours)  C Difficile Quick Screen (NO PCR Reflex)     Status: None   Collection Time: 10/16/23 12:31 PM   Specimen: STOOL  Result Value Ref Range Status   C Diff antigen NEGATIVE NEGATIVE Final   C Diff toxin NEGATIVE NEGATIVE Final   C Diff interpretation No C. difficile detected.  Final    Comment: Performed at [facility], [address], [place], [place] [address]     Studies: No results found.     Ashanna Heinsohn, MD  [facility] 10/22/2023  If 7PM-7AM, please contact night-coverage

## 2023-10-22 NOTE — Care Management Important Message (Signed)
 Important Message  Patient Details  Name: Chase Combs MRN: [id number] Date of Birth: [birth year]   Important Message Given:  Yes - Medicare IM     Wynonia Hedges 10/22/2023, 3:24 PM

## 2023-10-22 NOTE — Progress Notes (Signed)
 [facility] Progress Note  Chief Complaint: Ileus, acute diarrhea  History:  Tolerating clear liquid diet since I saw him yesterday, no nausea vomiting or abdominal pain. Reports passing a little more gas today Still has loose stool and fecal collection bag, INO recordings show that the volume is significantly dropping off in the last few days.  ROS: Cardiovascular: No chest pain Respiratory: Still has a cough Urinary: Denies dysuria  Objective:   Current Facility-Administered Medications:    acetaminophen  (TYLENOL ) tablet 650 mg, 650 mg, Oral, Q6H PRN, Kerby Pearson III, MD, 650 mg at 10/22/23 0541   Chlorhexidine  Gluconate Cloth 2 % PADS 6 each, 6 each, Topical, Daily, Kerby Pearson III, MD, 6 each at 10/21/23 2143   feeding supplement (BOOST / RESOURCE BREEZE) liquid 1 Container, 1 Container, Oral, TID BM, Kerby Pearson III, MD, 1 Container at 10/22/23 0834   heparin  injection 5,000 Units, 5,000 Units, Subcutaneous, Q8H, Danis, Roel Clarity III, MD, 5,000 Units at 10/22/23 0543   insulin  aspart (novoLOG ) injection 0-15 Units, 0-15 Units, Subcutaneous, Q6H, Pokhrel, Laxman, MD, 2 Units at 10/22/23 1227   lidocaine  (LIDODERM ) 5 % 1 patch, 1 patch, Transdermal, Q24H, Danis, Roel Clarity III, MD, 1 patch at 10/21/23 2230   menthol -cetylpyridinium (CEPACOL) lozenge 3 mg, 1 lozenge, Oral, PRN, Kerby Pearson III, MD, 3 mg at 10/13/23 0224   metoprolol  tartrate (LOPRESSOR ) injection 5 mg, 5 mg, Intravenous, Q6H, Danis, Roel Clarity III, MD, 5 mg at 10/22/23 1151   Muscle Rub CREA, , Topical, PRN, Corrin Dimes, [facility], Given at 10/22/23 1155   ondansetron  (ZOFRAN ) injection 4 mg, 4 mg, Intravenous, Once, Danis, Cordelia Dessert, MD   Oral care mouth rinse, 15 mL, Mouth Rinse, PRN, Danis, Roel Clarity III, MD   pantoprazole (PROTONIX) injection 40 mg, 40 mg, Intravenous, QHS, Kerby Pearson III, MD, 40 mg at 10/21/23 2142   simethicone  (MYLICON) chewable tablet 80 mg, 80 mg, Oral, QID PRN, Kerby Pearson III, MD, 80  mg at 10/11/23 0026   sodium chloride  flush (NS) 0.9 % injection 10-40 mL, 10-40 mL, Intracatheter, Q12H, Danis, Roel Clarity III, MD, 10 mL at 10/22/23 0836   sodium chloride  flush (NS) 0.9 % injection 10-40 mL, 10-40 mL, Intracatheter, PRN, Danis, Cordelia Dessert, MD   TPN ADULT (ION), , Intravenous, Continuous TPN, Pokhrel, Laxman, MD, Last Rate: 90 mL/hr at 10/21/23 1823, Infusion Verify at 10/21/23 1823   TPN ADULT (ION), , Intravenous, Continuous TPN, Pokhrel, Laxman, MD   traMADol  (ULTRAM ) tablet 50 mg, 50 mg, Oral, Once, Danis, Roel Clarity III, MD   TPN ADULT (ION) 90 mL/hr at 10/21/23 1823   TPN ADULT (ION)       Vital signs in last 24 hrs: Vitals:   10/22/23 0813 10/22/23 1242  BP: 130/73 125/71  Pulse: 83 77  Resp: 19 18  Temp: 98.1 F (36.7 C) (!) 97.3 F (36.3 C)  SpO2: 96% 99%    Intake/Output Summary (Last 24 hours) at 10/22/2023 1338 Last data filed at 10/22/2023 1224 Gross per 24 hour  Intake 2583.41 ml  Output 355 ml  Net 2228.41 ml     Physical Exam NG tube was put to suction and nothing came out of it HEENT: sclera anicteric, oral mucosa without lesions Neck: supple, no thyromegaly, JVD or lymphadenopathy Cardiac: RRR without murmurs, S1S2 heard, no peripheral edema Pulm: Scattered rhonchi bilaterally, normal RR and effort noted Abdomen: soft, minimal distention, no tenderness, with more active  bowel sounds than yesterday. No guarding or palpable hepatosplenomegaly Skin; warm and dry, no jaundice  Recent Labs:     Latest Ref Rng & Units 10/21/2023    3:45 AM 10/19/2023   12:11 AM 10/18/2023    5:10 AM  CBC  WBC 4.0 - 10.5 K/uL 7.5  7.1  7.3   Hemoglobin 13.0 - 17.0 g/dL 9.2  9.7  9.4   Hematocrit 39.0 - 52.0 % 30.0  30.9  29.9   Platelets 150 - 400 K/uL 451  427  410     No results for input(s): "INR" in the last 168 hours.    Latest Ref Rng & Units 10/22/2023    4:59 AM 10/21/2023    3:45 AM 10/20/2023    3:30 AM  CMP  Glucose 70 - 99 mg/dL 161  096  045    BUN 8 - 23 mg/dL 16  20  26    Creatinine 0.61 - 1.24 mg/dL 4.09  8.11  9.14   Sodium 135 - 145 mmol/L 137  139  140   Potassium 3.5 - 5.1 mmol/L 4.4  4.8  5.3   Chloride 98 - 111 mmol/L 105  109  112   CO2 22 - 32 mmol/L 24  22  22    Calcium  8.9 - 10.3 mg/dL 8.6  8.8  8.4   Total Protein 6.5 - 8.1 g/dL  5.7    Total Bilirubin 0.0 - 1.2 mg/dL  0.4    Alkaline Phos 38 - 126 U/L  161    AST 15 - 41 U/L  45    ALT 0 - 44 U/L  91      Colon biopsies have returned normal   Assessment & Plan  Assessment: Protracted ileus, slowly improving in last few days Acute diarrhea, probably multifactorial due to acute illness, multiple medications.  Slowing down last few days, antibiotics stopped yesterday  I removed his NG tube as well.  Plan: Advance to full liquid diet, continue protein calorie supplements Continue TPN for now Going to add Questran once daily hoping to give some more formed of the stool.  Avoiding antidiarrheals as he is still recovering from ileus  Continue working with PT T to improve his mobility is much as possible   Kerby Pearson III Office: [phone]

## 2023-10-22 NOTE — Plan of Care (Signed)

## 2023-10-23 DIAGNOSIS — D649 Anemia, unspecified: Secondary | ICD-10-CM | POA: Diagnosis not present

## 2023-10-23 DIAGNOSIS — R0902 Hypoxemia: Secondary | ICD-10-CM | POA: Diagnosis not present

## 2023-10-23 DIAGNOSIS — R197 Diarrhea, unspecified: Secondary | ICD-10-CM | POA: Diagnosis not present

## 2023-10-23 LAB — BASIC METABOLIC PANEL WITH GFR
Anion gap: 8 (ref 5–15)
BUN: 17 mg/dL (ref 8–23)
CO2: 26 mmol/L (ref 22–32)
Calcium: 8.6 mg/dL — ABNORMAL LOW (ref 8.9–10.3)
Chloride: 100 mmol/L (ref 98–111)
Creatinine, Ser: 0.57 mg/dL — ABNORMAL LOW (ref 0.61–1.24)
GFR, Estimated: >60 mL/min (ref >60)
Glucose, Bld: 141 mg/dL — ABNORMAL HIGH (ref 70–99)
Potassium: 4.3 mmol/L (ref 3.5–5.1)
Sodium: 134 mmol/L — ABNORMAL LOW (ref 135–145)

## 2023-10-23 LAB — GLUCOSE, CAPILLARY
Glucose-Capillary: 125 mg/dL — ABNORMAL HIGH (ref 70–99)
Glucose-Capillary: 137 mg/dL — ABNORMAL HIGH (ref 70–99)
Glucose-Capillary: 141 mg/dL — ABNORMAL HIGH (ref 70–99)
Glucose-Capillary: 147 mg/dL — ABNORMAL HIGH (ref 70–99)

## 2023-10-23 MED ORDER — TRAVASOL 10 % IV SOLN
INTRAVENOUS | Status: AC
  Filled 2023-10-23: qty 1209.6

## 2023-10-23 MED ORDER — CHOLESTYRAMINE 4 G PO PACK
4.0000 g | PACK | Freq: Two times a day (BID) | ORAL | Status: DC
  Administered 2023-10-23 – 2023-10-25 (×4): 4 g via ORAL
  Filled 2023-10-23 (×5): qty 1

## 2023-10-23 MED ORDER — METOPROLOL SUCCINATE ER 25 MG PO TB24
12.5000 mg | ORAL_TABLET | Freq: Every day | ORAL | Status: DC
  Administered 2023-10-23 – 2023-10-27 (×5): 12.5 mg via ORAL
  Filled 2023-10-23 (×5): qty 1

## 2023-10-23 MED ORDER — AMLODIPINE BESYLATE 10 MG PO TABS
10.0000 mg | ORAL_TABLET | Freq: Every day | ORAL | Status: DC
  Administered 2023-10-23 – 2023-10-27 (×5): 10 mg via ORAL
  Filled 2023-10-23 (×5): qty 1

## 2023-10-23 NOTE — Progress Notes (Addendum)
 Daily Progress Note  DOA: 10/09/2023 Hospital Day: 15  Cc:  post-op ileus, diarrhea  Brief History:  78 y.o. 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 Readmitted 5/18 with ileus, nausea, vomiting , electrolyte abnormalities and hypoxia ( 2/2 to abdominal distention). See 5/20 GI consult note for generalized ileus.   ASSESSMENT    78 yo male admitted with Ileus following orthopedic surgery  Today:  Tolerating full liquids after NGT removed yesterday.    Diarrhea  Etiology unclear. Negative C-diff  x2, GI panel and colon biopsies.   Today:  Yesterday's total stool output was 500 ml but he has been having fecal leakage around tube. There is still about 500 ml in collection bag this am but that is because is was not emptied on evening shift. He is still having leakage around tube.  Reluctantly taking the questran  ( doesn't like taste).   [place] anemia Probably post -op anemia in part. Hgb stable at 9.2. Had knee surgery on 5/9.   Principal Problem:   Hypoxia Active Problems:   Ileus, postoperative (HCC)   Fever, unspecified   Leukocytosis   Hypokalemia   Acute diarrhea   Abnormal finding on GI tract imaging   Transaminitis   PLAN   --Continue full liquid diet for now. He has tried much on full liquid diet beyond Ensure.  --Continue Questran .  --I don't know if the flexiseal is really helping us  keep track of stool output given frequent rectal leaking around the tube. Maybe it should just come out soon  Subjective   No N/V or abdominal pain. Drinking clear and Ensure. Has a full liquid diet ordered   Objective   Recent Labs    10/21/23 0345  WBC 7.5  HGB 9.2*  HCT 30.0*  MCV 96.2  PLT 451*   No results for input(s): "FOLATE", "VITAMINB12", "FERRITIN", "TIBC", "IRONPCTSAT" in the last 72 hours. Recent Labs    10/21/23 0345 10/22/23 0459 10/23/23 0316  NA 139 137 134*   K 4.8 4.4 4.3  CL 109 105 100  CO2 22 24 26   GLUCOSE 152* 122* 141*  BUN 20 16 17   CREATININE 0.66 0.55* 0.57*  CALCIUM  8.8* 8.6* 8.6*   Recent Labs    10/21/23 0345  PROT 5.7*  ALBUMIN 2.2*  AST 45*  ALT 91*  ALKPHOS 161*  BILITOT 0.4      Imaging:  CT ABDOMEN PELVIS W CONTRAST CLINICAL DATA:  Prolonged ileus, abnormal x-ray  EXAM: CT ABDOMEN AND PELVIS WITH CONTRAST  TECHNIQUE: Multidetector CT imaging of the abdomen and pelvis was performed using the standard protocol following bolus administration of intravenous contrast.  RADIATION DOSE REDUCTION: This exam was performed according to the departmental dose-optimization program which includes automated exposure control, adjustment of the mA and/or kV according to patient size and/or use of iterative reconstruction technique.  CONTRAST:  75mL ISOVUE -370 IOPAMIDOL  (ISOVUE -370) INJECTION 76%  COMPARISON:  10/09/2023, 10/06/2023  FINDINGS: Lower chest: There is patchy airspace disease seen throughout the right lung, new since prior study, consistent with infection or aspiration. Areas of linear consolidation within the inferior dependent lower lobes bilaterally may reflect superimposed atelectasis. No effusion or pneumothorax. Central venous catheter tip at the atriocaval junction.  Hepatobiliary: No focal liver abnormality is seen. No gallstones, gallbladder wall thickening, or biliary dilatation.  Pancreas: Unremarkable. No pancreatic ductal dilatation or surrounding inflammatory changes.  Spleen: Normal in size without focal abnormality.  Adrenals/Urinary Tract: Stable appearance of the kidneys. No urinary tract calculi or obstructive uropathy. The adrenals and bladder are stable.  Stomach/Bowel: Distended fluid-filled loops of small bowel are identified, measuring up to 4.2 cm within the mid jejunum. Gas fluid levels are seen throughout the distended small bowel. No evidence of abrupt transition.  The colon is decompressed, with diffuse colonic wall thickening compatible with inflammatory or infectious colitis. This has developed in the interim since prior study. Superimposed diverticulosis of the descending and sigmoid colon. Rectal tube is in place. Enteric catheter tip within the lumen of the gastric antrum.  Vascular/Lymphatic: Aortic atherosclerosis. No enlarged abdominal or pelvic lymph nodes.  Reproductive: Stable enlargement of the prostate.  Other: No free fluid or free intraperitoneal gas. Small fat containing umbilical hernia.  Musculoskeletal: No acute or destructive bony abnormalities. Reconstructed images demonstrate no additional findings.  IMPRESSION: 1. Diffuse colonic wall thickening which has developed since the prior study, consistent with inflammatory or infectious colitis. 2. Distended fluid-filled loops of small bowel with multiple gas fluid levels, most pronounced within the jejunum. There is no evidence of abrupt transition point to suggest obstruction, and findings could reflect sequela of ileus or diarrheal illness. 3. Interval development of multifocal right-sided airspace disease consistent with aspiration or infection. 4. Stable fat containing umbilical hernia. 5. Enlarged prostate. 6. Stable distal colonic diverticulosis. 7.  Aortic Atherosclerosis (ICD10-I70.0).  Electronically Signed   By: Bobbye Burrow M.D.   On: 10/17/2023 20:10     Scheduled inpatient medications:   amLODipine   10 mg Oral Daily   Chlorhexidine  Gluconate Cloth  6 each Topical Daily   cholestyramine   4 g Oral Daily   feeding supplement  1 Container Oral TID BM   heparin   5,000 Units Subcutaneous Q8H   insulin  aspart  0-15 Units Subcutaneous Q6H   lidocaine   1 patch Transdermal Q24H   metoprolol  succinate  12.5 mg Oral Daily   ondansetron  (ZOFRAN ) IV  4 mg Intravenous Once   pantoprazole  (PROTONIX ) IV  40 mg Intravenous QHS   sodium chloride  flush  10-40 mL  Intracatheter Q12H   traMADol   50 mg Oral Once   Continuous inpatient infusions:   TPN ADULT (ION) 90 mL/hr at 10/23/23 0500   PRN inpatient medications: acetaminophen , menthol -cetylpyridinium, Muscle Rub, mouth rinse, simethicone , sodium chloride  flush  Vital signs in last 24 hours: Temp:  [97.3 F (36.3 C)-98 F (36.7 C)] 98 F (36.7 C) (05/31 0938) Pulse Rate:  [77-90] 90 (05/31 0938) Resp:  [18-19] 19 (05/30 1610) BP: (125-143)/(70-75) 141/70 (05/31 0938) SpO2:  [94 %-99 %] 97 % (05/31 0938) Weight:  [89.2 kg] 89.2 kg (05/31 0500) Last BM Date : 10/22/23  Intake/Output Summary (Last 24 hours) at 10/23/2023 0946 Last data filed at 10/23/2023 0900 Gross per 24 hour  Intake 2066.04 ml  Output 1095 ml  Net 971.04 ml    Intake/Output from previous day: 05/30 0701 - 05/31 0700 In: 2066 [I.V.:2066] Out: 920 [Urine:420; Stool:500] Intake/Output this shift: Total I/O In: -  Out: 175 [Urine:175]   Physical Exam:  General: Alert male in NAD Heart:  Regular rate and rhythm.  Pulmonary: Normal respiratory effort Abdomen: Soft, nondistended, nontender. Normal bowel sounds. Extremities: No lower extremity edema  Neurologic: Alert and oriented Psych: Pleasant. Cooperative     LOS: 13 days   Mai Schwalbe ,NP 10/23/2023, 9:46 AM   I have taken an interval history, thoroughly reviewed the chart and examined  the patient. I agree with the Advanced Practitioner's note, impression and recommendations, and have recorded additional findings, impressions and recommendations below. I performed a substantive portion of this encounter (>50% time spent), including a complete performance of the medical decision making.  My additional thoughts are as follows:  Abdominal exam benign, soft and nontender with increasingly active bowel sounds. Ileus resolved  Still having liquid stool in a fecal collection system, volume unclear since I's and O's do not seem to be accurately  recorded.  I offered removal of the rectal tube today, but he would like to keep it until tomorrow because it is particularly difficult for him if he has diarrhea overnight and cannot get assistance quickly to get to the bathroom. When the tube comes out (probably tomorrow, he would need a bedside commode.  Increasing him to a soft diet and increasing Questran  to twice daily   Kerby Pearson III Office:[phone]

## 2023-10-23 NOTE — Plan of Care (Signed)

## 2023-10-23 NOTE — Progress Notes (Signed)
 Mobility Specialist Progress Note   10/23/23 0830  Mobility  Activity Moved into chair position in bed (Bed mobility and exercise)  Level of Assistance Standby assist, set-up cues, supervision of patient - no hands on  Range of Motion/Exercises Active;All extremities  Activity Response Tolerated well  Mobility Specialist Start Time (ACUTE ONLY) 0830  Mobility Specialist Stop Time (ACUTE ONLY) 0840  Mobility Specialist Time Calculation (min) (ACUTE ONLY) 10 min   Patient received in supine and agreeable to participate. Deferred ambulation for unspecified reasons but agreeable to bed level exercise. Requested return later to complete hallway ambulation. Completed UE bed level exercises with resistance bands and LE ROM to stimulate muscle activation.Tolerated without complaint or incident. Was left in supine with all needs met, call bell in reach. Will return later as time permits.  [place] Madell Heino, BS EXP Mobility Specialist Please contact via SecureChat or Rehab office at [phone]

## 2023-10-23 NOTE — Progress Notes (Signed)
 PHARMACY - TOTAL PARENTERAL NUTRITION CONSULT NOTE   Indication: Prolonged ileus  Patient Measurements: Height: 5\' 9"  (175.3 cm) Weight: 89.2 kg (196 lb 10.4 oz) IBW/kg (Calculated) : 70.7 TPN AdjBW (KG): 75.4Ht 69 in Body mass index is 29.04 kg/m. Usual Weight: 98.3 kg  Assessment:  [name] who presents with abdominal distention in the setting of prolonged ileus. Patient had surgery on his knee on May 9 and was taking oxycodone  at home for pain. Endorses that intake has declined significantly since his procedure. Prior to procedure he was eating 3 meals a day which consisted of coffee and toast for breakfast, a veggie platter and meat for lunch, and a dinner prepared by his wife. Does not remember intake after surgery very well but states he was eating very little as nothing was appealing to him.   Has been receiving Relistor , Reglan , and neostigmine  in combination with SMOG enemas, however has persistent bowel dilation. No bowel sounds present on physical exam. Pharmacy has been consulted to initiate TPN in setting of prolonged ileus and minimal intake.   5/23 - unable to use PICC due to placement. TPN not given, D10 @ 74ml/hr overnight. 5/24 - New PICC placed and TPN started at 1000  Potassium up 5.3 on 5/28 AM. TPN held from 10AM-6PM, started D10 at 90 ml/hr. TPN with reduced potassium started 5/28 @6PM .   Glucose / Insulin : CBGs <180, 6 units of SSI utilized past 24 hours and 40 units insulin  in TPN Electrolytes: Na 134, K 4.3 (goal >4 with ileus), Mg 1.9 (Goal >2 w/ ileus), Cl 100, CoCa ~10 Renal: Scr <1, BUN 17 Hepatic: AlkPhos up 161, Tbili wnl; AST/ALST 45/91. TG 186, albumin 2.2 Intake / Output; MIVF: 0 ml NG output. UOP 0.2 ml/kg/hr charted, 300 mL stool  GI Imaging:  5/21 KUB: progressive adynamic ileus 5/23 Abd xray: unchanged diffuse gaseous small bowel distension, likely ileus  5/24 Abd xray: Severely dilated loops of air-filled small bowel throughout the mid abdomen  with minimal colonic gas 5/25 CT Abd: distended fluid-filled loops of small bowel w/ multiple gas fluid levels, most pronounced within jejunum. No abrupt transition points suggestive of obstruction- findings could reflect sequela of ileus or diarrheal illness.  GI Surgeries / Procedures:  5/28- sigmoidoscopy  Central access: PICC (placed 5/21), replaced 5/24 TPN start date: 5/21  Nutritional Goals: Goal TPN rate is 90 mL/hr (provides 121 g of protein and 2200 kcals per day)  RD Assessment: Estimated Needs Total Energy Estimated Needs: 2200-2400 Total Protein Estimated Needs: 110-130 grams Total Fluid Estimated Needs: >/= 2 L  Current Nutrition:  5/21 TPN 5/29 LCD 5/30 FLD 5/31 regular diet  Plan:  Continue TPN at goal rate of 90 mL/hr at 1800. This will provide 100% of pt's estimated needs. Electrolytes in TPN: Na 89mEq/L, K decrease to 40 mEq/L, Ca 5 mEq/L, Mg 2 mEq/L, and Phos 42mmol/L. Cl:Ac max acetate Add standard MVI and trace elements to TPN Continue regular insulin  in TPN to 40 units daily Change to Moderate q6h SSI and adjust as needed  Monitor TPN labs on Mon/Thurs, PRN  F/u toleration of diet, wean TPN as able (possibly 6/01 per GI)   Thank you for allowing pharmacy to be a part of this patient's care.  Claudia Cuff, PharmD, BCPS Clinical Pharmacist

## 2023-10-23 NOTE — Progress Notes (Addendum)
 PROGRESS NOTE  Chase Combs ZOX:[id number] DOB: 1945/08/31 DOA: 10/09/2023 PCP: Ilsa Maltese, PA   LOS: 13 days   Brief narrative:  78 years old male with PMH significant for Left TKR on 10/01/2023 for osteoarthritis, CAD S/p MI s/p coronary stent x 2, who presented to [facility] with complaints of feeling unwell for 1 week since his total knee replacement.  He was noted to have distended abdomen and was tachycardic and tachypneic with distended abdomen and was hypoxic.    A CT abdomen/pelvis and CTA for Pulmonary emboli were done.  He was noted to have frequent PVCs and possible short runs of V Tach and Amiodarone  drip was started.  CT scan abd wihout definitive obstruction and CTA no Pulmonary emboli.  He was admitted for acute hypoxemic respiratory failure secondary to restricted ventilation due to markedly distended abdomen related to opiate induced ileus.  PICC line placed 5/24 and patient remains on TPN.   CT abdomen performed on 5/25 with diffuse small bowel dilation without transition point and some concern for right lung aspiration.   Assessment/Plan: Principal Problem:   Hypoxia Active Problems:   Ileus, postoperative (HCC)   Fever, unspecified   Leukocytosis   Hypokalemia   Acute diarrhea   Abnormal finding on GI tract imaging   Transaminitis   Acute hypoxemic respiratory failure: Aspiration pneumonia. Likely secondary to  hypoventilation from distended abdomen with possible aspiration.  CT scan showed some signs of aspiration.  Was on Zosyn  IV empirically and has completed the course.  Currently on room air.   continue incentive spirometry.  Opioid Induced Ileus vs SBO Risk of Refeeding Syndrome Possible colitis NG tube will has been removed at this time after clamping attempt.  Has been started on clears followed by full liquids and has been tolerating.  Initially required Relistor  and Reglan  currently off.  Patient is still on on TPN.  PICC line placed  10/16/2023. Colonic wall thickening was noted, so patient underwent flexible colonoscopy with findings of internal hemorrhoids diverticulosis and congested mucosa.    Still has some loose stools on rectal tube.    CAD s/p CABG:  Frequent PVCs and NSVT HTN Currently on IV metoprolol .  Will restart amlodipine  and metoprolol  from home.  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  Would benefit from lifestyle modification and weight loss as outpatient.   DVT prophylaxis: heparin  injection 5,000 Units Start: 10/10/23 1400 SCDs Start: 10/10/23 0437   Disposition: Home with home health likely in 1 to 2 days.  Status is: Inpatient Remains inpatient appropriate because: pending clinical improvement, TPN, resolving ileus.   Code Status:     Code Status: Full Code  Family Communication: None at bedside.  Spoke with the patient's spouse on the phone and updated her about the clinical condition of the patient.  Consultants: GI  Procedures: NG tube placement and removal  rectal tube placement Flexible sigmoidoscopy 10/20/2023  Anti-infectives:  Zosyn  IV-completed  Anti-infectives (From admission, onward)    Start     Dose/Rate Route Frequency Ordered Stop   10/18/23 0915  piperacillin -tazobactam (ZOSYN ) IVPB 3.375 g  Status:  Discontinued        3.375 g 12.5 mL/hr over 240 Minutes Intravenous Every 8 hours 10/18/23 0819 10/21/23 1551   10/10/23 0600  piperacillin -tazobactam (ZOSYN ) IVPB 3.375 g  Status:  Discontinued        3.375 g 12.5 mL/hr over 240 Minutes Intravenous Every 8 hours 10/10/23 0445 10/11/23 0951   10/09/23 2345  vancomycin  (VANCOCIN ) 500 mg in sodium chloride  0.9 % 100 mL IVPB       Placed in "And" Linked Group   500 mg 100 mL/hr over 60 Minutes Intravenous  Once 10/09/23 2231 10/10/23 0103   10/09/23 2245  vancomycin  (VANCOCIN ) IVPB 1000 mg/200 mL premix       Placed in "And" Linked Group   1,000 mg 200 mL/hr over 60 Minutes Intravenous  Once 10/09/23 2231 10/09/23 2359    10/09/23 2215  vancomycin  (VANCOCIN ) IVPB 1000 mg/200 mL premix  Status:  Discontinued        1,000 mg 200 mL/hr over 60 Minutes Intravenous  Once 10/09/23 2212 10/09/23 2227   10/09/23 2215  piperacillin -tazobactam (ZOSYN ) IVPB 3.375 g        3.375 g 100 mL/hr over 30 Minutes Intravenous  Once 10/09/23 2212 10/09/23 2330      Subjective: Today, patient was seen and examined at bedside.  Patient states that he feels hot and sweaty in the room denies any nausea vomiting abdominal pain.  Was able to tolerate full liquids.  Has been having loose stools.  Denies any abdominal pain.    Objective: Vitals:   10/23/23 0009 10/23/23 0426  BP: (!) 140/70 (!) 143/75  Pulse: 88 90  Resp:    Temp:    SpO2: 94% 94%    Intake/Output Summary (Last 24 hours) at 10/23/2023 0928 Last data filed at 10/23/2023 0900 Gross per 24 hour  Intake 2066.04 ml  Output 1095 ml  Net 971.04 ml   Filed Weights   10/21/23 0500 10/21/23 1018 10/23/23 0500  Weight: 89.4 kg 89.4 kg 89.2 kg   Body mass index is 29.04 kg/m.   Physical Exam:  GENERAL: Patient is alert awake and oriented. Not in obvious distress.  Obese build.  Elderly male. HENT: No scleral pallor or icterus. Pupils equally reactive to light. Oral mucosa is moist NECK: is supple, no gross swelling noted. CHEST:   Diminished breath sounds bilaterally.  No obvious crackles or wheezes. CVS: S1 and S2 heard, no murmur. Regular rate and rhythm.  ABDOMEN: Soft, distended abdomen, nontender.  Rectal tube in place. EXTREMITIES: No edema.  PICC line in place. CNS: Cranial nerves are intact. No focal motor deficits. SKIN: warm and dry without rashes.  Data Review: I have personally reviewed the following laboratory data and studies,  CBC: Recent Labs  Lab 10/16/23 1054 10/17/23 0300 10/18/23 0510 10/19/23 0011 10/21/23 0345  WBC 16.5* 12.6* 7.3 7.1 7.5  HGB 10.1* 9.7* 9.4* 9.7* 9.2*  HCT 30.3* 29.8* 29.9* 30.9* 30.0*  MCV 89.9 91.7 93.7 93.4  96.2  PLT 473* 443* 410* 427* 451*   Basic Metabolic Panel: Recent Labs  Lab 10/16/23 1054 10/17/23 0300 10/17/23 1835 10/18/23 0510 10/19/23 0011 10/20/23 0330 10/21/23 0345 10/22/23 0459 10/23/23 0316  NA 138 138   < > 138 142  141 140 139 137 134*  K 3.2* 3.3*   < > 4.1 4.7  4.8 5.3* 4.8 4.4 4.3  CL 97* 97*   < > 103 107  108 112* 109 105 100  CO2 31 33*   < > 27 24  25 22 22 24 26   GLUCOSE 165* 164*   < > 203* 158*  158* 158* 152* 122* 141*  BUN 21 29*   < > 25* 24*  23 26* 20 16 17   CREATININE 0.99 0.85   < > 0.66 0.71  0.70 0.74 0.66 0.55* 0.57*  CALCIUM  8.1*  8.4*   < > 8.5* 9.0  8.9 8.4* 8.8* 8.6* 8.6*  MG 2.0 2.6*  --  2.4 2.2 2.3 1.9  --   --   PHOS 2.9  --   --  3.8 4.1  --  3.5  --   --    < > = values in this interval not displayed.   Liver Function Tests: Recent Labs  Lab 10/18/23 0510 10/19/23 0011 10/21/23 0345  AST 49* 78* 45*  ALT 58* 99* 91*  ALKPHOS 85 115 161*  BILITOT 0.7 0.6 0.4  PROT 5.5* 5.8* 5.7*  ALBUMIN 2.1* 2.2*  2.1* 2.2*   No results for input(s): "LIPASE", "AMYLASE" in the last 168 hours. No results for input(s): "AMMONIA" in the last 168 hours. Cardiac Enzymes: No results for input(s): "CKTOTAL", "CKMB", "CKMBINDEX", "TROPONINI" in the last 168 hours. BNP (last 3 results) Recent Labs    10/10/23 0619  BNP 373.7*    ProBNP (last 3 results) No results for input(s): "PROBNP" in the last 8760 hours.  CBG: Recent Labs  Lab 10/22/23 0811 10/22/23 1204 10/22/23 1607 10/23/23 0020 10/23/23 0519  GLUCAP 122* 128* 110* 137* 147*   Recent Results (from the past 240 hours)  C Difficile Quick Screen (NO PCR Reflex)     Status: None   Collection Time: 10/16/23 12:31 PM   Specimen: STOOL  Result Value Ref Range Status   C Diff antigen NEGATIVE NEGATIVE Final   C Diff toxin NEGATIVE NEGATIVE Final   C Diff interpretation No C. difficile detected.  Final    Comment: Performed at [facility], [address],  [place], [place] [address]     Studies: No results found.     Manroop Jakubowicz, MD  [facility] 10/23/2023  If 7PM-7AM, please contact night-coverage

## 2023-10-24 DIAGNOSIS — D649 Anemia, unspecified: Secondary | ICD-10-CM | POA: Diagnosis not present

## 2023-10-24 DIAGNOSIS — R197 Diarrhea, unspecified: Secondary | ICD-10-CM | POA: Diagnosis not present

## 2023-10-24 DIAGNOSIS — R0902 Hypoxemia: Secondary | ICD-10-CM | POA: Diagnosis not present

## 2023-10-24 LAB — BASIC METABOLIC PANEL WITH GFR
Anion gap: 11 (ref 5–15)
BUN: 16 mg/dL (ref 8–23)
CO2: 25 mmol/L (ref 22–32)
Calcium: 8.8 mg/dL — ABNORMAL LOW (ref 8.9–10.3)
Chloride: 97 mmol/L — ABNORMAL LOW (ref 98–111)
Creatinine, Ser: 0.53 mg/dL — ABNORMAL LOW (ref 0.61–1.24)
GFR, Estimated: >60 mL/min (ref >60)
Glucose, Bld: 134 mg/dL — ABNORMAL HIGH (ref 70–99)
Potassium: 4.3 mmol/L (ref 3.5–5.1)
Sodium: 133 mmol/L — ABNORMAL LOW (ref 135–145)

## 2023-10-24 LAB — CBC
HCT: 29.4 % — ABNORMAL LOW (ref 39.0–52.0)
Hemoglobin: 9.5 g/dL — ABNORMAL LOW (ref 13.0–17.0)
MCH: 30.2 pg (ref 26.0–34.0)
MCHC: 32.3 g/dL (ref 30.0–36.0)
MCV: 93.3 fL (ref 80.0–100.0)
Platelets: 386 10*3/uL (ref 150–400)
RBC: 3.15 MIL/uL — ABNORMAL LOW (ref 4.22–5.81)
RDW: 16.5 % — ABNORMAL HIGH (ref 11.5–15.5)
WBC: 6.6 10*3/uL (ref 4.0–10.5)
nRBC: 0.5 % — ABNORMAL HIGH (ref 0.0–0.2)

## 2023-10-24 LAB — GLUCOSE, CAPILLARY
Glucose-Capillary: 114 mg/dL — ABNORMAL HIGH (ref 70–99)
Glucose-Capillary: 134 mg/dL — ABNORMAL HIGH (ref 70–99)
Glucose-Capillary: 137 mg/dL — ABNORMAL HIGH (ref 70–99)
Glucose-Capillary: 149 mg/dL — ABNORMAL HIGH (ref 70–99)
Glucose-Capillary: 166 mg/dL — ABNORMAL HIGH (ref 70–99)
Glucose-Capillary: 206 mg/dL — ABNORMAL HIGH (ref 70–99)

## 2023-10-24 LAB — MAGNESIUM: Magnesium: 1.7 mg/dL (ref 1.7–2.4)

## 2023-10-24 MED ORDER — TRAVASOL 10 % IV SOLN
INTRAVENOUS | Status: AC
  Filled 2023-10-24: qty 604.8

## 2023-10-24 MED ORDER — METRONIDAZOLE 500 MG PO TABS
500.0000 mg | ORAL_TABLET | Freq: Two times a day (BID) | ORAL | Status: DC
  Administered 2023-10-24 – 2023-10-27 (×7): 500 mg via ORAL
  Filled 2023-10-24 (×7): qty 1

## 2023-10-24 MED ORDER — MAGNESIUM SULFATE 2 GM/50ML IV SOLN
2.0000 g | Freq: Once | INTRAVENOUS | Status: AC
  Administered 2023-10-24: 2 g via INTRAVENOUS
  Filled 2023-10-24: qty 50

## 2023-10-24 NOTE — Progress Notes (Signed)
 PHARMACY - TOTAL PARENTERAL NUTRITION CONSULT NOTE   Indication: Prolonged ileus  Patient Measurements: Height: 5\' 9"  (175.3 cm) Weight: 90.3 kg (199 lb 1.2 oz) IBW/kg (Calculated) : 70.7 TPN AdjBW (KG): 75.4Ht 69 in Body mass index is 29.4 kg/m. Usual Weight: 98.3 kg  Assessment:  [name] who presents with abdominal distention in the setting of prolonged ileus. Patient had surgery on his knee on May 9 and was taking oxycodone  at home for pain. Endorses that intake has declined significantly since his procedure. Prior to procedure he was eating 3 meals a day which consisted of coffee and toast for breakfast, a veggie platter and meat for lunch, and a dinner prepared by his wife. Does not remember intake after surgery very well but states he was eating very little as nothing was appealing to him.   Has been receiving Relistor , Reglan , and neostigmine  in combination with SMOG enemas, however has persistent bowel dilation. No bowel sounds present on physical exam. Pharmacy has been consulted to initiate TPN in setting of prolonged ileus and minimal intake.   5/23 - unable to use PICC due to placement. TPN not given, D10 @ 62ml/hr overnight. 5/24 - New PICC placed and TPN started at 1000  Potassium up 5.3 on 5/28 AM. TPN held from 10AM-6PM, started D10 at 90 ml/hr. TPN with reduced potassium started 5/28 @6PM .   Glucose / Insulin : CBGs <180, 6 units of SSI utilized past 24 hours and 40 units insulin  in TPN Electrolytes: Na 133, K 4.3 (goal >4 with ileus), Mg 1.7 (Goal >2 w/ ileus), Cl 97, CoCa ~10 Renal: Scr <1, BUN 17 Hepatic: AlkPhos up 161, Tbili wnl; AST/ALST 45/91. TG 186, albumin 2.2 Intake / Output; MIVF:no charted NG output. UOP 0.7 ml/kg/hr, 500 mL stool from 5/30-5/31 (leaking reported from rectal tube)  GI Imaging:  5/21 KUB: progressive adynamic ileus 5/23 Abd xray: unchanged diffuse gaseous small bowel distension, likely ileus  5/24 Abd xray: Severely dilated loops of  air-filled small bowel throughout the mid abdomen with minimal colonic gas 5/25 CT Abd: distended fluid-filled loops of small bowel w/ multiple gas fluid levels, most pronounced within jejunum. No abrupt transition points suggestive of obstruction- findings could reflect sequela of ileus or diarrheal illness.  GI Surgeries / Procedures:  5/28- sigmoidoscopy  Central access: PICC (placed 5/21), replaced 5/24 TPN start date: 5/21  Nutritional Goals: Goal TPN rate is 90 mL/hr (provides 121 g of protein and 2200 kcals per day)  RD Assessment: Estimated Needs Total Energy Estimated Needs: 2200-2400 Total Protein Estimated Needs: 110-130 grams Total Fluid Estimated Needs: >/= 2 L  Current Nutrition:  5/21 TPN 5/29 LCD 5/30 FLD 5/31 regular diet  Plan:  Decrease to  TPN at goal rate of 45 mL/hr at 1800. This will provide 50% of pt's estimated needs. Electrolytes in TPN: Na 35mEq/L, K 40 mEq/L, Ca 5 mEq/L, Mg 5 mEq/L, and Phos 2mmol/L. Cl:Ac max acetate Add standard MVI and trace elements to TPN Adjust to 1/2 insulin  in TPN > 20 units daily Change to Moderate q6h SSI and adjust as needed  Monitor TPN labs on Mon/Thurs, PRN  F/u toleration of diet, wean TPN as able (possibly 6/01 per GI)   Thank you for allowing pharmacy to be a part of this patient's care.  Claudia Cuff, PharmD, BCPS Clinical Pharmacist

## 2023-10-24 NOTE — Progress Notes (Addendum)
 Daily Progress Note  DOA: 10/09/2023 Hospital Day: 47   Cc:  Ileus, acute diarrhea   Brief History:  78 y.o. 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 Readmitted 5/18 with ileus, nausea, vomiting , electrolyte abnormalities and hypoxia ( 2/2 to abdominal distention). See 5/20 GI consult note for generalized ileus   ASSESSMENT    Post op Ileus.  Resolved.    Diarrhea  Etiology unclear. Negative C-diff  x2, GI panel and colon biopsies.   Today:  Yesterday's total stool output unknown but currently about 400 unformed stool in collection bag. Stool is malodorous.  We increased Questran  to BID yesterday   [place] anemia Probably post -op anemia in part. Hgb stable at 9.5. Had knee surgery on 5/9.   Principal Problem:   Hypoxia Active Problems:   Ileus, postoperative (HCC)   Fever, unspecified   Leukocytosis   Hypokalemia   Acute diarrhea   Abnormal finding on GI tract imaging   Transaminitis   PLAN   --Fecal calprotectin --Trial of empiric flagyl 500 mg BID to treat possible GI infection not detected by stool studies. Hopefully he will be able to tolerate PO flagyl without too much nausea.   Subjective   Mild nausea today but tolerating diet. No abdominal pain    Objective   GI Studies:    Recent Labs    10/24/23 0142  WBC 6.6  HGB 9.5*  HCT 29.4*  MCV 93.3  PLT 386   No results for input(s): "FOLATE", "VITAMINB12", "FERRITIN", "TIBC", "IRONPCTSAT" in the last 72 hours. Recent Labs    10/22/23 0459 10/23/23 0316 10/24/23 0142  NA 137 134* 133*  K 4.4 4.3 4.3  CL 105 100 97*  CO2 24 26 25   GLUCOSE 122* 141* 134*  BUN 16 17 16   CREATININE 0.55* 0.57* 0.53*  CALCIUM  8.6* 8.6* 8.8*   No results for input(s): "PROT", "ALBUMIN", "AST", "ALT", "ALKPHOS", "BILITOT", "BILIDIR", "IBILI" in the last 72 hours.    Imaging:  CT ABDOMEN PELVIS W CONTRAST CLINICAL  DATA:  Prolonged ileus, abnormal x-ray  EXAM: CT ABDOMEN AND PELVIS WITH CONTRAST  TECHNIQUE: Multidetector CT imaging of the abdomen and pelvis was performed using the standard protocol following bolus administration of intravenous contrast.  RADIATION DOSE REDUCTION: This exam was performed according to the departmental dose-optimization program which includes automated exposure control, adjustment of the mA and/or kV according to patient size and/or use of iterative reconstruction technique.  CONTRAST:  75mL ISOVUE -370 IOPAMIDOL  (ISOVUE -370) INJECTION 76%  COMPARISON:  10/09/2023, 10/06/2023  FINDINGS: Lower chest: There is patchy airspace disease seen throughout the right lung, new since prior study, consistent with infection or aspiration. Areas of linear consolidation within the inferior dependent lower lobes bilaterally may reflect superimposed atelectasis. No effusion or pneumothorax. Central venous catheter tip at the atriocaval junction.  Hepatobiliary: No focal liver abnormality is seen. No gallstones, gallbladder wall thickening, or biliary dilatation.  Pancreas: Unremarkable. No pancreatic ductal dilatation or surrounding inflammatory changes.  Spleen: Normal in size without focal abnormality.  Adrenals/Urinary Tract: Stable appearance of the kidneys. No urinary tract calculi or obstructive uropathy. The adrenals and bladder are stable.  Stomach/Bowel: Distended fluid-filled loops of small bowel are identified, measuring up to 4.2 cm within the mid jejunum. Gas fluid levels are seen throughout the distended small bowel. No evidence of abrupt transition. The colon is decompressed, with diffuse  colonic wall thickening compatible with inflammatory or infectious colitis. This has developed in the interim since prior study. Superimposed diverticulosis of the descending and sigmoid colon. Rectal tube is in place. Enteric catheter tip within the lumen of the  gastric antrum.  Vascular/Lymphatic: Aortic atherosclerosis. No enlarged abdominal or pelvic lymph nodes.  Reproductive: Stable enlargement of the prostate.  Other: No free fluid or free intraperitoneal gas. Small fat containing umbilical hernia.  Musculoskeletal: No acute or destructive bony abnormalities. Reconstructed images demonstrate no additional findings.  IMPRESSION: 1. Diffuse colonic wall thickening which has developed since the prior study, consistent with inflammatory or infectious colitis. 2. Distended fluid-filled loops of small bowel with multiple gas fluid levels, most pronounced within the jejunum. There is no evidence of abrupt transition point to suggest obstruction, and findings could reflect sequela of ileus or diarrheal illness. 3. Interval development of multifocal right-sided airspace disease consistent with aspiration or infection. 4. Stable fat containing umbilical hernia. 5. Enlarged prostate. 6. Stable distal colonic diverticulosis. 7.  Aortic Atherosclerosis (ICD10-I70.0).  Electronically Signed   By: Bobbye Burrow M.D.   On: 10/17/2023 20:10     Scheduled inpatient medications:   amLODipine   10 mg Oral Daily   Chlorhexidine  Gluconate Cloth  6 each Topical Daily   cholestyramine   4 g Oral BID   feeding supplement  1 Container Oral TID BM   heparin   5,000 Units Subcutaneous Q8H   insulin  aspart  0-15 Units Subcutaneous Q6H   lidocaine   1 patch Transdermal Q24H   metoprolol  succinate  12.5 mg Oral Daily   ondansetron  (ZOFRAN ) IV  4 mg Intravenous Once   sodium chloride  flush  10-40 mL Intracatheter Q12H   traMADol   50 mg Oral Once   Continuous inpatient infusions:   TPN ADULT (ION) 90 mL/hr at 10/24/23 0400   TPN ADULT (ION)     PRN inpatient medications: acetaminophen , menthol -cetylpyridinium, Muscle Rub, mouth rinse, simethicone , sodium chloride  flush  Vital signs in last 24 hours: Temp:  [97.3 F (36.3 C)-98.7 F (37.1 C)] 97.3  F (36.3 C) (06/01 0900) Pulse Rate:  [91-93] 93 (06/01 0900) Resp:  [17] 17 (06/01 0044) BP: (118-144)/(64-97) 144/89 (06/01 0900) SpO2:  [94 %-96 %] 94 % (06/01 0900) Weight:  [90.3 kg] 90.3 kg (06/01 0437) Last BM Date : 10/23/23  Intake/Output Summary (Last 24 hours) at 10/24/2023 1324 Last data filed at 10/24/2023 1000 Gross per 24 hour  Intake 2489.48 ml  Output 1650 ml  Net 839.48 ml    Intake/Output from previous day: 05/31 0701 - 06/01 0700 In: 2489.5 [P.O.:400; I.V.:2089.5] Out: 1575 [Urine:1575] Intake/Output this shift: Total I/O In: -  Out: 350 [Stool:350]   Physical Exam:  General: Alert male in NAD Heart:  Regular rate and rhythm.  Pulmonary: Normal respiratory effort Abdomen: Soft, nondistended, nontender. Normal bowel sounds. Extremities: No lower extremity edema  Neurologic: Alert and oriented Psych: Pleasant. Cooperative     LOS: 14 days   Mai Schwalbe ,NP 10/24/2023, 1:24 PM   I have taken an interval history, thoroughly reviewed the chart and examined the patient. I agree with the Advanced Practitioner's note, impression and recommendations, and have recorded additional findings, impressions and recommendations below. I performed a substantive portion of this encounter (>50% time spent), including a complete performance of the medical decision making.  My additional thoughts are as follows:  Still with watery diarrhea, not being accurately recorded in the eyes and nose.  Foul-smelling as before Remains puzzling, cause unclear. As  abruptly as it started in with its volume it must be secretory diarrhea and I am concerned it could still be some infection just not picked up on the stool studies that were done. We are stopping the Questran  because it is not helping. Starting empiric metronidazole If does not help within a couple of days?  Vancomycin .  C. difficile with negative toxin and antigen would be admittedly very unusual.  Ileus resolved  patient now on soft diet. Abdomen soft nontender with good bowel sounds today  Keep on TPN until we can get control of this diarrhea and the fluid losses and until patient is more consistently taking solid food.  Dr. Yvone Herd will pick up the consult service tomorrow.   Kerby Pearson III Office:[phone]

## 2023-10-24 NOTE — Plan of Care (Signed)

## 2023-10-24 NOTE — Progress Notes (Signed)
 PROGRESS NOTE  Chase Combs:[id number] DOB: 05-Mar-1946 DOA: 10/09/2023 PCP: Ilsa Maltese, PA   LOS: 14 days   Brief narrative:  78 years old male with PMH significant for Left TKR on 10/01/2023 for osteoarthritis, CAD S/p MI s/p coronary stent x 2, who presented to [facility] with complaints of feeling unwell for 1 week since his total knee replacement.  He was noted to have distended abdomen and was tachycardic and tachypneic with distended abdomen and was hypoxic.    A CT abdomen/pelvis and CTA for Pulmonary emboli were done.  He was noted to have frequent PVCs and possible short runs of V Tach and Amiodarone  drip was started.  CT scan abd wihout definitive obstruction and CTA no Pulmonary emboli.  He was admitted for acute hypoxemic respiratory failure secondary to restricted ventilation due to markedly distended abdomen related to opiate induced ileus.  PICC line placed 5/24 and patient remains on TPN.   CT abdomen performed on 5/25 with diffuse small bowel dilation without transition point and some concern for right lung aspiration.   Assessment/Plan: Principal Problem:   Hypoxia Active Problems:   Ileus, postoperative (HCC)   Fever, unspecified   Leukocytosis   Hypokalemia   Acute diarrhea   Abnormal finding on GI tract imaging   Transaminitis   Acute hypoxemic respiratory failure: Aspiration pneumonia. Likely secondary to  hypoventilation from distended abdomen with possible aspiration.  CT scan showed some signs of aspiration.  Was on Zosyn  IV empirically and has completed the course.  Currently on room air.   continue incentive spirometry.  Opioid Induced Ileus vs SBO Risk of Refeeding Syndrome Possible colitis Tolerating Oral diet has been advanced to soft.  Initially required Relistor  and Reglan  currently off.  Patient is still on on TPN.  PICC line placed 10/16/2023. Colonic wall thickening was noted, so patient underwent flexible colonoscopy with findings  of internal hemorrhoids diverticulosis and congested mucosa.    Still has some loose stools on rectal tube.  On questran  to improve consistency.  If oral intake is adequate will likely to discontinue TPN.  CAD s/p CABG:  Frequent PVCs and NSVT HTN On  amlodipine  and metoprolol  from home.  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  Would benefit from lifestyle modification and weight loss as outpatient.   DVT prophylaxis: heparin  injection 5,000 Units Start: 10/10/23 1400 SCDs Start: 10/10/23 0437   Disposition: Home with home health likely in 1 to 2 days.  Status is: Inpatient Remains inpatient appropriate because: pending clinical improvement, TPN, resolving ileus, still with diarrhea   Code Status:     Code Status: Full Code  Family Communication:  Spoke with the patient's spouse on on 5/31  Consultants: GI  Procedures: NG tube placement and removal  rectal tube placement Flexible sigmoidoscopy 10/20/2023  Anti-infectives:  Zosyn  IV-completed  Subjective  Today, patient was seen and examined at bedside.  Patient tolerated oral diet.  Still has loose stools.  Denies any nausea vomiting fever shortness of breath dyspnea.   Objective: Vitals:   10/24/23 0044 10/24/23 0453  BP: (!) 118/97 (!) 140/68  Pulse: 91 92  Resp: 17   Temp: 98.4 F (36.9 C) 98.4 F (36.9 C)  SpO2: 94% 94%    Intake/Output Summary (Last 24 hours) at 10/24/2023 0918 Last data filed at 10/24/2023 0437 Gross per 24 hour  Intake 2489.48 ml  Output 1400 ml  Net 1089.48 ml   Filed Weights   10/21/23 1018 10/23/23 0500 10/24/23  1610  Weight: 89.4 kg 89.2 kg 90.3 kg   Body mass index is 29.4 kg/m.   Physical Exam:  General:   not in obvious distress, obese built, elderly male.   HENT:   No scleral pallor or icterus noted. Oral mucosa is moist.  Chest:  Clear breath sounds. No crackles or wheezes.  CVS: S1 &S2 heard. No murmur.  Regular rate and rhythm. Abdomen: Soft, nontender, slightly distended..   Bowel sounds are heard.  Rectal tube in place Extremities: No cyanosis, clubbing or edema.  Peripheral pulses are palpable.  PICC line in place. Psych: Alert, awake and oriented, normal mood CNS:  No cranial nerve deficits.  Power equal in all extremities.   Skin: Warm and dry.  No rashes noted.   Data Review: I have personally reviewed the following laboratory data and studies,  CBC: Recent Labs  Lab 10/18/23 0510 10/19/23 0011 10/21/23 0345 10/24/23 0142  WBC 7.3 7.1 7.5 6.6  HGB 9.4* 9.7* 9.2* 9.5*  HCT 29.9* 30.9* 30.0* 29.4*  MCV 93.7 93.4 96.2 93.3  PLT 410* 427* 451* 386   Basic Metabolic Panel: Recent Labs  Lab 10/18/23 0510 10/19/23 0011 10/20/23 0330 10/21/23 0345 10/22/23 0459 10/23/23 0316 10/24/23 0142  NA 138 142  141 140 139 137 134* 133*  K 4.1 4.7  4.8 5.3* 4.8 4.4 4.3 4.3  CL 103 107  108 112* 109 105 100 97*  CO2 27 24  25 22 22 24 26 25   GLUCOSE 203* 158*  158* 158* 152* 122* 141* 134*  BUN 25* 24*  23 26* 20 16 17 16   CREATININE 0.66 0.71  0.70 0.74 0.66 0.55* 0.57* 0.53*  CALCIUM  8.5* 9.0  8.9 8.4* 8.8* 8.6* 8.6* 8.8*  MG 2.4 2.2 2.3 1.9  --   --  1.7  PHOS 3.8 4.1  --  3.5  --   --   --    Liver Function Tests: Recent Labs  Lab 10/18/23 0510 10/19/23 0011 10/21/23 0345  AST 49* 78* 45*  ALT 58* 99* 91*  ALKPHOS 85 115 161*  BILITOT 0.7 0.6 0.4  PROT 5.5* 5.8* 5.7*  ALBUMIN 2.1* 2.2*  2.1* 2.2*   No results for input(s): "LIPASE", "AMYLASE" in the last 168 hours. No results for input(s): "AMMONIA" in the last 168 hours. Cardiac Enzymes: No results for input(s): "CKTOTAL", "CKMB", "CKMBINDEX", "TROPONINI" in the last 168 hours. BNP (last 3 results) Recent Labs    10/10/23 0619  BNP 373.7*    ProBNP (last 3 results) No results for input(s): "PROBNP" in the last 8760 hours.  CBG: Recent Labs  Lab 10/23/23 1250 10/23/23 1657 10/24/23 0040 10/24/23 0651 10/24/23 0816  GLUCAP 125* 141* 137* 166* 149*   Recent  Results (from the past 240 hours)  C Difficile Quick Screen (NO PCR Reflex)     Status: None   Collection Time: 10/16/23 12:31 PM   Specimen: STOOL  Result Value Ref Range Status   C Diff antigen NEGATIVE NEGATIVE Final   C Diff toxin NEGATIVE NEGATIVE Final   C Diff interpretation No C. difficile detected.  Final    Comment: Performed at [facility], [address], [place], [place] [address]     Studies: No results found.     Victory Dresden, MD  [facility] 10/24/2023  If 7PM-7AM, please contact night-coverage

## 2023-10-25 DIAGNOSIS — R197 Diarrhea, unspecified: Secondary | ICD-10-CM | POA: Diagnosis not present

## 2023-10-25 DIAGNOSIS — E46 Unspecified protein-calorie malnutrition: Secondary | ICD-10-CM | POA: Diagnosis not present

## 2023-10-25 DIAGNOSIS — R0902 Hypoxemia: Secondary | ICD-10-CM | POA: Diagnosis not present

## 2023-10-25 LAB — COMPREHENSIVE METABOLIC PANEL WITH GFR
ALT: 86 U/L — ABNORMAL HIGH (ref 0–44)
AST: 46 U/L — ABNORMAL HIGH (ref 15–41)
Albumin: 2.2 g/dL — ABNORMAL LOW (ref 3.5–5.0)
Alkaline Phosphatase: 250 U/L — ABNORMAL HIGH (ref 38–126)
Anion gap: 9 (ref 5–15)
BUN: 14 mg/dL (ref 8–23)
CO2: 25 mmol/L (ref 22–32)
Calcium: 8.6 mg/dL — ABNORMAL LOW (ref 8.9–10.3)
Chloride: 97 mmol/L — ABNORMAL LOW (ref 98–111)
Creatinine, Ser: 0.57 mg/dL — ABNORMAL LOW (ref 0.61–1.24)
GFR, Estimated: >60 mL/min (ref >60)
Glucose, Bld: 116 mg/dL — ABNORMAL HIGH (ref 70–99)
Potassium: 4.3 mmol/L (ref 3.5–5.1)
Sodium: 131 mmol/L — ABNORMAL LOW (ref 135–145)
Total Bilirubin: 0.5 mg/dL (ref 0.0–1.2)
Total Protein: 5.7 g/dL — ABNORMAL LOW (ref 6.5–8.1)

## 2023-10-25 LAB — CBC
HCT: 29.7 % — ABNORMAL LOW (ref 39.0–52.0)
Hemoglobin: 9.4 g/dL — ABNORMAL LOW (ref 13.0–17.0)
MCH: 29.5 pg (ref 26.0–34.0)
MCHC: 31.6 g/dL (ref 30.0–36.0)
MCV: 93.1 fL (ref 80.0–100.0)
Platelets: 422 10*3/uL — ABNORMAL HIGH (ref 150–400)
RBC: 3.19 MIL/uL — ABNORMAL LOW (ref 4.22–5.81)
RDW: 17.1 % — ABNORMAL HIGH (ref 11.5–15.5)
WBC: 6.5 10*3/uL (ref 4.0–10.5)
nRBC: 0.3 % — ABNORMAL HIGH (ref 0.0–0.2)

## 2023-10-25 LAB — GLUCOSE, CAPILLARY
Glucose-Capillary: 115 mg/dL — ABNORMAL HIGH (ref 70–99)
Glucose-Capillary: 121 mg/dL — ABNORMAL HIGH (ref 70–99)
Glucose-Capillary: 123 mg/dL — ABNORMAL HIGH (ref 70–99)
Glucose-Capillary: 181 mg/dL — ABNORMAL HIGH (ref 70–99)

## 2023-10-25 LAB — TRIGLYCERIDES: Triglycerides: 221 mg/dL — ABNORMAL HIGH (ref <150)

## 2023-10-25 LAB — PHOSPHORUS: Phosphorus: 3.9 mg/dL (ref 2.5–4.6)

## 2023-10-25 LAB — MAGNESIUM: Magnesium: 2.1 mg/dL (ref 1.7–2.4)

## 2023-10-25 MED ORDER — ONDANSETRON HCL 4 MG/2ML IJ SOLN
4.0000 mg | Freq: Four times a day (QID) | INTRAMUSCULAR | Status: DC | PRN
  Administered 2023-10-26 – 2023-10-27 (×2): 4 mg via INTRAVENOUS
  Filled 2023-10-25 (×2): qty 2

## 2023-10-25 MED ORDER — ENSURE PLUS HIGH PROTEIN PO LIQD
237.0000 mL | Freq: Two times a day (BID) | ORAL | Status: DC
  Administered 2023-10-25 – 2023-10-27 (×3): 237 mL via ORAL
  Filled 2023-10-25: qty 237

## 2023-10-25 MED ORDER — TRAVASOL 10 % IV SOLN
INTRAVENOUS | Status: AC
  Filled 2023-10-25: qty 604.8

## 2023-10-25 NOTE — Progress Notes (Signed)
 PROGRESS NOTE  Lemmon DUCRE WGN:[id number] DOB: 1945-10-13 DOA: 10/09/2023 PCP: Ilsa Maltese, PA   LOS: 15 days   Brief narrative:  78 years old male with PMH significant for Left TKR on 10/01/2023 for osteoarthritis, CAD S/p MI s/p coronary stent x 2, who presented to [facility] with complaints of feeling unwell for 1 week since his total knee replacement.  He was noted to have distended abdomen and was tachycardic and tachypneic with distended abdomen and was hypoxic.    A CT abdomen/pelvis and CTA for Pulmonary emboli were done.  He was noted to have frequent PVCs and possible short runs of V Tach and Amiodarone  drip was started.  CT scan abd wihout definitive obstruction and CTA no Pulmonary emboli.  He was admitted for acute hypoxemic respiratory failure secondary to restricted ventilation due to markedly distended abdomen related to opiate induced ileus.  PICC line placed 5/24 and patient remains on TPN.   CT abdomen performed on 5/25 with diffuse small bowel dilation without transition point and some concern for right lung aspiration.   Assessment/Plan: Principal Problem:   Hypoxia Active Problems:   Ileus, postoperative (HCC)   Fever, unspecified   Leukocytosis   Hypokalemia   Acute diarrhea   Abnormal finding on GI tract imaging   Transaminitis   Acute hypoxemic respiratory failure: Aspiration pneumonia. Resolved.  Likely secondary to  hypoventilation from distended abdomen with possible aspiration.  CT scan showed some signs of aspiration.  Completed course of Zosyn .  On room air at this time.  Continue incentive spirometer.  Opioid Induced Ileus vs SBO Possible colitis Had prolonged ileus and was on TPN.  Gradually advancing diet and as tolerated.  Still on TPN due to ongoing diarrhea.. patient underwent flexible colonoscopy with findings of internal hemorrhoids diverticulosis and congested mucosa.   Will DC TPN when oral intake is adequate and diarrhea resolved.   Decrease half dose of TPN from today.  CAD s/p CABG:  Frequent PVCs and NSVT HTN On  amlodipine  and metoprolol  from home.  Blood pressure seems to be stable.  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  Would benefit from lifestyle modification and weight loss as outpatient.   DVT prophylaxis: heparin  injection 5,000 Units Start: 10/10/23 1400 SCDs Start: 10/10/23 0437   Disposition: Home with home health likely in 1 to 2 days.  Status is: Inpatient Remains inpatient appropriate because: pending clinical improvement, TPN, resolving ileus, still with diarrhea   Code Status:     Code Status: Full Code  Family Communication:  Spoke with the patient's spouse on on 5/31  Consultants: GI  Procedures: NG tube placement and removal  rectal tube placement Flexible sigmoidoscopy 10/20/2023  Anti-infectives:  Zosyn  IV-completed  Subjective  Today, patient was seen and examined at bedside.  Patient has tolerated diet.  Ambulating in the hallway.  Still has diarrhea on a rectal tube.  Has been tolerating oral diet.  Objective: Vitals:   10/25/23 0036 10/25/23 1133  BP: 124/77 128/75  Pulse: 95 96  Resp: 19 18  Temp: 97.8 F (36.6 C) 98.5 F (36.9 C)  SpO2: 93% 97%    Intake/Output Summary (Last 24 hours) at 10/25/2023 1308 Last data filed at 10/25/2023 1140 Gross per 24 hour  Intake 1141.88 ml  Output 1500 ml  Net -358.12 ml   Filed Weights   10/23/23 0500 10/24/23 0437 10/25/23 0500  Weight: 89.2 kg 90.3 kg 89.1 kg   Body mass index is 29.01 kg/m.  Physical Exam:  General: Obese built, not in obvious distress elderly male, Communicative HENT:   No scleral pallor or icterus noted. Oral mucosa is moist.  Chest:  Clear breath sounds.  Diminished breath sounds bilaterally. No crackles or wheezes.  CVS: S1 &S2 heard. No murmur.  Regular rate and rhythm. Abdomen: Soft, nontender, slightly distended.  Bowel sounds are heard.  Rectal tube in place. Extremities: No cyanosis,  clubbing or edema.  Peripheral pulses are palpable.  PICC line in place. Psych: Alert, awake and oriented, normal mood CNS:  No cranial nerve deficits.  Power equal in all extremities.   Skin: Warm and dry.  No rashes noted.    Data Review: I have personally reviewed the following laboratory data and studies,  CBC: Recent Labs  Lab 10/19/23 0011 10/21/23 0345 10/24/23 0142 10/25/23 0251  WBC 7.1 7.5 6.6 6.5  HGB 9.7* 9.2* 9.5* 9.4*  HCT 30.9* 30.0* 29.4* 29.7*  MCV 93.4 96.2 93.3 93.1  PLT 427* 451* 386 422*   Basic Metabolic Panel: Recent Labs  Lab 10/19/23 0011 10/20/23 0330 10/21/23 0345 10/22/23 0459 10/23/23 0316 10/24/23 0142 10/25/23 0251  NA 142  141 140 139 137 134* 133* 131*  K 4.7  4.8 5.3* 4.8 4.4 4.3 4.3 4.3  CL 107  108 112* 109 105 100 97* 97*  CO2 24  25 22 22 24 26 25 25   GLUCOSE 158*  158* 158* 152* 122* 141* 134* 116*  BUN 24*  23 26* 20 16 17 16 14   CREATININE 0.71  0.70 0.74 0.66 0.55* 0.57* 0.53* 0.57*  CALCIUM  9.0  8.9 8.4* 8.8* 8.6* 8.6* 8.8* 8.6*  MG 2.2 2.3 1.9  --   --  1.7 2.1  PHOS 4.1  --  3.5  --   --   --  3.9   Liver Function Tests: Recent Labs  Lab 10/19/23 0011 10/21/23 0345 10/25/23 0251  AST 78* 45* 46*  ALT 99* 91* 86*  ALKPHOS 115 161* 250*  BILITOT 0.6 0.4 0.5  PROT 5.8* 5.7* 5.7*  ALBUMIN 2.2*  2.1* 2.2* 2.2*   No results for input(s): "LIPASE", "AMYLASE" in the last 168 hours. No results for input(s): "AMMONIA" in the last 168 hours. Cardiac Enzymes: No results for input(s): "CKTOTAL", "CKMB", "CKMBINDEX", "TROPONINI" in the last 168 hours. BNP (last 3 results) Recent Labs    10/10/23 0619  BNP 373.7*    ProBNP (last 3 results) No results for input(s): "PROBNP" in the last 8760 hours.  CBG: Recent Labs  Lab 10/24/23 1700 10/24/23 2043 10/25/23 0036 10/25/23 0635 10/25/23 1135  GLUCAP 134* 114* 121* 123* 181*   Recent Results (from the past 240 hours)  C Difficile Quick Screen (NO PCR  Reflex)     Status: None   Collection Time: 10/16/23 12:31 PM   Specimen: STOOL  Result Value Ref Range Status   C Diff antigen NEGATIVE NEGATIVE Final   C Diff toxin NEGATIVE NEGATIVE Final   C Diff interpretation No C. difficile detected.  Final    Comment: Performed at [facility], [address] [place], [place] [address]     Studies: No results found.     Rejeana Fadness, MD  [facility] 10/25/2023  If 7PM-7AM, please contact night-coverage

## 2023-10-25 NOTE — Progress Notes (Signed)
 Nutrition Follow-up  DOCUMENTATION CODES:  Not applicable  INTERVENTION:  Recommend continuing TPN to meet 100% of estimated needs until pt is able to consume adequate PO nutrition. Discontinue Boost Breeze, replace with Ensure Enlive po BID, each supplement provides 350 kcal and 20 grams of protein.   NUTRITION DIAGNOSIS:  Inadequate oral intake related to inability to eat as evidenced by NPO status. - Progressing, now on diet and TPN  GOAL:  Patient will meet greater than or equal to 90% of their needs - Being addressed   MONITOR:  PO intake, Supplement acceptance, I & O's, Labs, Weight trends  REASON FOR ASSESSMENT:  Consult Assessment of nutrition requirement/status, New TPN/TNA  ASSESSMENT:  78 y.o. male presented to the ED with SOB. 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 PMH includes CAD, HTN, OSA, and malignant neoplasm of prostate. Pt admitted with acute respiratory failure, Ileus, nasuea, vomiting, and gastroenteritis.  5/18 - Admitted; diet advanced to clear liquids 5/20 - NPO; NGT - LIWS 5/21 - TPN started 5/23 pm - unable to use PICC due to placement. TPN not given, D10 @ 41ml/hr overnight. 5/24 - New PICC placed and TPN started at 1000 5/26 - CT scan showed diffuse small bowel dilation. Numerous air-fluid levels. No colonic dilation, but diffuse colon wall thickening suggestive of colitis,  Patchy airspace disease in the right lung, suggestive of aspiration.  5/28 - s/p sigmoidoscopy findings of internal hemorrhoids, diverticulosis, and congested mucosa in colon 5/29 - NGT clamping trials; diet advanced to clear liquids 5/30 - NGT removed; diet advanced to full liquids  5/31 - diet advanced to Soft  TPN was decreased to half rate on 10/24/23 per pharmacy note.  Pt sitting up in chair at time of RD visit. Reports that he had fruit cocktail, half a banana, and milk this morning for breakfast. Had the roast beef last night for supper. Currently drinking a Boost  Breeze during RD visit. Shares that the food is bland and appetite is not great possibly 2/2 to own taste buds. He often is skipping lunch due to being full from supplements & meds needed between breakfast and lunch. Is having dizzy headaches and phlegm each night from laying flat.   Discussed utilizing Ensure to contain more calories and protein. Pt believes there are none on the unit; RD checked unit fridge, plenty are supplied on unit.   Meal Intake 6/01: 100% dinner  Admission Weight: 98.1 kg  Current Weight: 89.1 kg (6/02)  Nutrition Related Medications: Questran , NovoLog  0-15 units q6h, Flagyl Labs: Sodium 131, Potassium 4.3, BUN 14, Creatinine 0.57, Phosphorus 3.9, Magnesium 2.1  CBG: 114-206 mg/dL x 24 hrs   UOP: 784 mL x 24 hrs  Diet Order:   Diet Order             DIET SOFT Fluid consistency: Thin  Diet effective now                  EDUCATION NEEDS: No education needs have been identified at this time  Skin:  Skin Assessment: Reviewed RN Assessment  Last BM:  6/1 - 900 mL via FMS  Height:  Ht Readings from Last 1 Encounters:  10/21/23 5\' 9"  (1.753 m)   Weight:  Wt Readings from Last 1 Encounters:  10/25/23 89.1 kg   Ideal Body Weight:  72.7 kg  BMI:  Body mass index is 29.01 kg/m.  Estimated Nutritional Needs:  Kcal:  2200-2400 Protein:  110-130 grams Fluid:  >/= 2 L  Doneta Furbish RD, LDN Clinical Dietitian

## 2023-10-25 NOTE — Plan of Care (Signed)

## 2023-10-25 NOTE — Anesthesia Postprocedure Evaluation (Signed)
 Anesthesia Post Note  Patient: Chase Combs  Procedure(s) Performed: Marlynn Singer     Patient location during evaluation: Endoscopy Anesthesia Type: General Level of consciousness: awake and alert Pain management: pain level controlled Vital Signs Assessment: post-procedure vital signs reviewed and stable Respiratory status: spontaneous breathing, nonlabored ventilation and respiratory function stable Cardiovascular status: stable and blood pressure returned to baseline Postop Assessment: no apparent nausea or vomiting Anesthetic complications: no   No notable events documented.                  Edie Vallandingham

## 2023-10-25 NOTE — Progress Notes (Signed)
 Patient ID: Chase Combs, male   DOB: 1945-09-06, 78 y.o.   MRN: [id number]    Progress Note   Subjective   Day # 15 CC; prolonged diffuse postop ileus now resolved and complicated by diarrhea  TPN Questran  stopped 10/25/2023 Metronidazole  10/24/2023  Status post flexible sigmoidoscopy 10/20/2023-multiple diverticuli and congested/cobblestoned mucosa throughout the examined colon Biopsies benign colonic mucosa  Fecal calprotectin pending Labs today-WBC 6.5/hemoglobin 9.4/hematocrit 29.7/platelets 422 Sodium 131/potassium 4.3/BUN 14/creatinine 0.57 Alk phos 250/AST 46/ALT 86  Patient says he does feel better over the past week and is glad that he is able to eat though still not eating a whole lot and says everything continues to taste bad no current complaints of abdominal pain-still has Flexi-Seal in place-noticed significant stool in the bag currently  I am not sure the output documentation is accurate-no stool recorded since yesterday via fecal management system   Objective   Vital signs in last 24 hours: Temp:  [97.8 F (36.6 C)-98.4 F (36.9 C)] 97.8 F (36.6 C) (06/02 0036) Pulse Rate:  [94-95] 95 (06/02 0036) Resp:  [16-19] 19 (06/02 0036) BP: (124-129)/(69-77) 124/77 (06/02 0036) SpO2:  [93 %-96 %] 93 % (06/02 0036) Weight:  [89.1 kg] 89.1 kg (06/02 0500) Last BM Date : 10/24/23 General:    Elderly white male in NAD, sitting up in the chair, wife at bedside Heart:  Regular rate and rhythm; no murmurs Lungs: Respirations even and unlabored, lungs CTA bilaterally Abdomen:  Soft, large, bowel sounds present, few high-pitched tinkles Extremities:  Without edema. Neurologic:  Alert and oriented,  grossly normal neurologically. Psych:  Cooperative. Normal mood and affect.  Intake/Output from previous day: 06/01 0701 - 06/02 0700 In: 786.2 [P.O.:340; I.V.:446.2] Out: 1850 [Urine:950; Stool:900] Intake/Output this shift: No intake/output data recorded.  Lab  Results: Recent Labs    10/24/23 0142 10/25/23 0251  WBC 6.6 6.5  HGB 9.5* 9.4*  HCT 29.4* 29.7*  PLT 386 422*   BMET Recent Labs    10/23/23 0316 10/24/23 0142 10/25/23 0251  NA 134* 133* 131*  K 4.3 4.3 4.3  CL 100 97* 97*  CO2 26 25 25   GLUCOSE 141* 134* 116*  BUN 17 16 14   CREATININE 0.57* 0.53* 0.57*  CALCIUM  8.6* 8.8* 8.6*   LFT Recent Labs    10/25/23 0251  PROT 5.7*  ALBUMIN 2.2*  AST 46*  ALT 86*  ALKPHOS 250*  BILITOT 0.5   PT/INR No results for input(s): "LABPROT", "INR" in the last 72 hours.       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  He required multiple enemas, multiple doses of Relistor , neostigmine  etc. and ileus finally resolved however he developed diarrhea which has now been persistent over the past week  C. difficile negative x 2 Etiology of the diarrhea is not clear-fecal calprotectin pending, question if he could have developed SIBO, or have other  infectious etiology despite negative stool studies  #2 malnutrition-now tolerating solid food/soft diet but not eating entire trays TPN continues, decrease to half rate today  Plan; stop Questran  Continue metronidazole If no improvement by tomorrow, repeat stool studies, and consider switching to vancomycin  Strict output documentation needed      Principal Problem:   Hypoxia Active Problems:   Ileus, postoperative (HCC)   Fever, unspecified   Leukocytosis   Hypokalemia   Acute diarrhea  Abnormal finding on GI tract imaging   Transaminitis     LOS: 15 days   Rhenda Oregon EsterwoodPA-C  10/25/2023, 10:09 AM

## 2023-10-25 NOTE — Anesthesia Preprocedure Evaluation (Addendum)
 Anesthesia Evaluation  Patient identified by MRN, date of birth, ID band Patient awake    Reviewed: Allergy & Precautions, NPO status , Patient's Chart, lab work & pertinent test results  History of Anesthesia Complications (+) PONV and history of anesthetic complications  Airway Mallampati: II  TM Distance: >3 FB Neck ROM: Full    Dental  (+) Missing, Dental Advisory Given, Partial Upper,    Pulmonary sleep apnea (no cpap)    breath sounds clear to auscultation       Cardiovascular hypertension, Pt. on medications and Pt. on home beta blockers (-) angina + CAD, + Past MI (2006 2010) and + DOE   Rhythm:Regular  07/12/2023 Echo  1. Left ventricular ejection fraction, by estimation, is 40 to 45%. The  left ventricle has mildly decreased function. The left ventricle  demonstrates global hypokinesis. The left ventricular internal cavity size  was moderately to severely dilated. There   is mild concentric left ventricular hypertrophy. Left ventricular  diastolic parameters are consistent with Grade I diastolic dysfunction  (impaired relaxation). The average left ventricular global longitudinal  strain is -15.2 %. The global longitudinal  strain is normal.   2. Right ventricular systolic function is normal. The right ventricular  size is normal.   3. Left atrial size was mild to moderately dilated.   4. The mitral valve is normal in structure. Trivial mitral valve  regurgitation. No evidence of mitral stenosis.   5. The aortic valve is normal in structure. Aortic valve regurgitation is  not visualized. No aortic stenosis is present.   6. There is mild dilatation of the aortic root, measuring 40 mm.   7. The inferior vena cava is normal in size with greater than 50%  respiratory variability, suggesting right atrial pressure of 3 mmHg.  07/12/2023 Myoview  Stress test    Findings are consistent with infarction. The study is high  risk.   No ST deviation was noted.   LV perfusion is abnormal. There is no evidence of ischemia. There is evidence of infarction. Defect 1: There is a medium defect with moderate reduction in uptake present in the apical to basal inferior and apex location(s) that is fixed. There is abnormal wall motion in the defect area. Consistent with infarction.   Left ventricular function is abnormal. Nuclear stress EF: 31%. The left ventricular ejection fraction is moderately decreased (30-44%). End diastolic cavity size is severely enlarged. End systolic cavity size is severely enlarged.   Prior study available for comparison from 03/05/2012.   Fixed inferior perfusion defect with abnormal wall motion consistent with infarct LVEF 31% High risk study due to systolic dysfunction.  No ischemia     Neuro/Psych negative neurological ROS  negative psych ROS   GI/Hepatic negative GI ROS, Neg liver ROS,,,  Endo/Other  neg diabetes    Renal/GU negative Renal ROSLab Results      Component                Value               Date                            K                        3.9                 09/22/2023  CO2                      27                  09/22/2023                BUN                      14                  09/22/2023                CREATININE               0.60 (L)            09/22/2023                GFRNONAA                 >60                 09/22/2023                 GLUCOSE                  150 (H)             09/22/2023                Musculoskeletal  (+) Arthritis , Osteoarthritis,    Abdominal   Peds  Hematology Lab Results      Component                Value               Date                      WBC                      6.0                 09/22/2023                HGB                      13.0                09/22/2023                HCT                      40.0                09/22/2023                MCV                      93.7                 09/22/2023                PLT                      271                 09/22/2023  Anesthesia Other Findings All: codeine  Reproductive/Obstetrics                             Anesthesia Physical Anesthesia Plan  ASA: 3  Anesthesia Plan: General   Post-op Pain Management:    Induction: Intravenous, Rapid sequence and Cricoid pressure planned  PONV Risk Score and Plan: 2 and Treatment may vary due to age or medical condition, Propofol  infusion and Ondansetron   Airway Management Planned: Oral ETT and Mask  Additional Equipment: None  Intra-op Plan:   Post-operative Plan: Extubation in OR  Informed Consent: I have reviewed the patients History and Physical, chart, labs and discussed the procedure including the risks, benefits and alternatives for the proposed anesthesia with the patient or authorized representative who has indicated his/her understanding and acceptance.     Dental advisory given  Plan Discussed with: CRNA  Anesthesia Plan Comments:         Anesthesia Quick Evaluation

## 2023-10-25 NOTE — Progress Notes (Signed)
 Occupational Therapy Treatment Patient Details Name: Chase Combs MRN: [id number] DOB: 04/03/1946 Today's Date: 10/25/2023   History of present illness 78 yo male presented to [facility] ED 10/09/23 for N/V and abdominal distension. Pt with ileus, short runs of Vtach, and hypoxic respiratory failure.  10/10/23 transfer to [facility] PICC placed 5/21. Pt required NG tube placement. PMhx: 10/01/23 Lt TKA, CAD, coronary stent, obesity, HTN, prostate CA, OSA.   OT comments  Pt progressing towards goals this session, reports mild n/v and dizziness, BP WNL. Pt still with difficulty completing LB ADLs, overall needs mod I for bed mobility and supervision for transfers with RW. Pt presenting with impairments listed below, will follow acutely. Continue to recommend HHOT at d/c.      If plan is discharge home, recommend the following:  Assistance with cooking/housework;Direct supervision/assist for medications management;Assist for transportation;Help with stairs or ramp for entrance;Direct supervision/assist for financial management;A lot of help with bathing/dressing/bathroom;A lot of help with walking and/or transfers   Equipment Recommendations  None recommended by OT    Recommendations for Other Services      Precautions / Restrictions Precautions Precautions: Fall;Knee;Other (comment) Precaution Booklet Issued: No Recall of Precautions/Restrictions: Intact Precaution/Restrictions Comments: flexi seal Restrictions Weight Bearing Restrictions Per Provider Order: No Other Position/Activity Restrictions: no pillow or bolster under bend of knee at rest, OK to have under ankle to promote L knee extension       Mobility Bed Mobility Overal bed mobility: Modified Independent                  Transfers Overall transfer level: Needs assistance Equipment used: Rolling walker (2 wheels) Transfers: Sit to/from Stand Sit to Stand: Supervision                 Balance Overall balance  assessment: Needs assistance Sitting-balance support: No upper extremity supported Sitting balance-Leahy Scale: Fair     Standing balance support: During functional activity, Reliant on assistive device for balance, Bilateral upper extremity supported Standing balance-Leahy Scale: Poor Standing balance comment: RW in standing                           ADL either performed or assessed with clinical judgement   ADL Overall ADL's : Needs assistance/impaired Eating/Feeding: Set up;Sitting                   Lower Body Dressing: Moderate assistance;Sitting/lateral leans;Sit to/from stand   Toilet Transfer: Contact guard assist;Ambulation;Rolling walker (2 wheels)           Functional mobility during ADLs: Contact guard assist;Rolling walker (2 wheels)      Extremity/Trunk Assessment Upper Extremity Assessment Upper Extremity Assessment: Generalized weakness   Lower Extremity Assessment Lower Extremity Assessment: Defer to PT evaluation        Vision   Vision Assessment?: No apparent visual deficits   Perception Perception Perception: Not tested   Praxis Praxis Praxis: Not tested   Communication Communication Communication: No apparent difficulties Factors Affecting Communication: Hearing impaired   Cognition   Behavior During Therapy: WFL for tasks assessed/performed Cognition: No apparent impairments                               Following commands: Intact        Cueing   Cueing Techniques: Verbal cues  Exercises      Shoulder Instructions  General Comments VSS    Pertinent Vitals/ Pain       Pain Assessment Pain Assessment: Faces Pain Score: 4  Faces Pain Scale: Hurts little more Pain Location: L knee Pain Descriptors / Indicators: Discomfort Pain Intervention(s): Limited activity within patient's tolerance, Monitored during session, Repositioned  Home Living                                           Prior Functioning/Environment              Frequency  Min 2X/week        Progress Toward Goals  OT Goals(current goals can now be found in the care plan section)  Progress towards OT goals: Progressing toward goals  Acute Rehab OT Goals Patient Stated Goal: did not state OT Goal Formulation: With patient Time For Goal Achievement: 10/30/23 Potential to Achieve Goals: Fair ADL Goals Pt Will Perform Grooming: with set-up;standing Pt Will Perform Upper Body Dressing: with min assist;sitting Pt Will Perform Lower Body Dressing: with min assist;sitting/lateral leans;sit to/from stand Pt Will Transfer to Toilet: with min assist;ambulating;regular height toilet Pt Will Perform Toileting - Clothing Manipulation and hygiene: with supervision;sitting/lateral leans Pt/caregiver will Perform Home Exercise Program: Increased strength;Both right and left upper extremity;With theraband;Independently;With written HEP provided Additional ADL Goal #1: Pt will demonstrate understanding of 2 pressure relief strategies to reduce onset of wounds Additional ADL Goal #2: Pt will complete bed mobility assessement to determine capacity for EOB/OOB activity  Plan      Co-evaluation                 AM-PAC OT "6 Clicks" Daily Activity     Outcome Measure   Help from another person eating meals?: A Little Help from another person taking care of personal grooming?: A Little Help from another person toileting, which includes using toliet, bedpan, or urinal?: A Little Help from another person bathing (including washing, rinsing, drying)?: A Lot Help from another person to put on and taking off regular upper body clothing?: A Little Help from another person to put on and taking off regular lower body clothing?: A Lot 6 Click Score: 16    End of Session Equipment Utilized During Treatment: Gait belt;Rolling walker (2 wheels)  OT Visit Diagnosis: Muscle weakness (generalized)  (M62.81);Other abnormalities of gait and mobility (R26.89);Pain Pain - Right/Left: Left Pain - part of body: Knee   Activity Tolerance Patient tolerated treatment well   Patient Left in bed;with call bell/phone within reach;with bed alarm set;with family/visitor present   Nurse Communication Mobility status        Time: 1443-1501 OT Time Calculation (min): 18 min  Charges: OT General Charges $OT Visit: 1 Visit OT Treatments $Therapeutic Activity: 8-22 mins  Petula Rotolo K, OTD, OTR/L SecureChat Preferred Acute Rehab [phone]   Benedict Brain Koonce 10/25/2023, 4:23 PM

## 2023-10-25 NOTE — Progress Notes (Addendum)
 PHARMACY - TOTAL PARENTERAL NUTRITION CONSULT NOTE   Indication: Prolonged ileus  Patient Measurements: Height: 5\' 9"  (175.3 cm) Weight: 89.1 kg (196 lb 6.9 oz) IBW/kg (Calculated) : 70.7 TPN AdjBW (KG): 75.4Ht 69 in Body mass index is 29.01 kg/m. Usual Weight: 98.3 kg  Assessment:  [name] who presents with abdominal distention in the setting of prolonged ileus. Patient had surgery on his knee on May 9 and was taking oxycodone  at home for pain. Endorses that intake has declined significantly since his procedure. Prior to procedure he was eating 3 meals a day which consisted of coffee and toast for breakfast, a veggie platter and meat for lunch, and a dinner prepared by his wife. Does not remember intake after surgery very well but states he was eating very little as nothing was appealing to him.   Has been receiving Relistor , Reglan , and neostigmine  in combination with SMOG enemas, however has persistent bowel dilation. No bowel sounds present on physical exam. Pharmacy has been consulted to initiate TPN in setting of prolonged ileus and minimal intake.   5/23 - unable to use PICC due to placement. TPN not given, D10 @ 76ml/hr overnight. 5/24 - New PICC placed and TPN started at 1000  Potassium up 5.3 on 5/28 AM. TPN held from 10AM-6PM, started D10 at 90 ml/hr. TPN with reduced potassium started 5/28 @6PM .   Glucose / Insulin : CBGs <180, 11 units of SSI utilized past 24 hours and 20 units insulin  in TPN Electrolytes: Na 131, K 4.3 (goal >4 with ileus), Mg 2.1 (Goal >2 w/ ileus), Cl 97, CoCa ~10 Renal: Scr <1, BUN 17 Hepatic: AlkPhos up 250, Tbili wnl; AST/ALST 46/86. TG 221, albumin 2.2 Intake / Output; MIVF:no charted NG output. UOP 0.4 ml/kg/hr, 900 mL stool   GI Imaging:  5/21 KUB: progressive adynamic ileus 5/23 Abd xray: unchanged diffuse gaseous small bowel distension, likely ileus  5/24 Abd xray: Severely dilated loops of air-filled small bowel throughout the mid abdomen with  minimal colonic gas 5/25 CT Abd: distended fluid-filled loops of small bowel w/ multiple gas fluid levels, most pronounced within jejunum. No abrupt transition points suggestive of obstruction- findings could reflect sequela of ileus or diarrheal illness.  GI Surgeries / Procedures:  5/28- sigmoidoscopy  Central access: PICC (placed 5/21), replaced 5/24 TPN start date: 5/21  Nutritional Goals: Goal TPN rate is 90 mL/hr (provides 121 g of protein and 2200 kcals per day)  RD Assessment: Estimated Needs Total Energy Estimated Needs: 2200-2400 Total Protein Estimated Needs: 110-130 grams Total Fluid Estimated Needs: >/= 2 L  Current Nutrition:  5/21 TPN 5/29 LCD 5/30 FLD 5/31 regular diet  Plan:  Continue at TPN at goal rate of 45 mL/hr at 1800 as discussed w MD this morning. This will provide 50% of pt's estimated needs. Electrolytes in TPN: Na 60 mEq/L, K 40 mEq/L, Ca 5 mEq/L, Mg 5 mEq/L, and Phos 12mmol/L. Cl:Ac 2:1 Add standard MVI and trace elements to TPN Continue insulin  22 units in TPN Change to Moderate q6h SSI and adjust as needed  Monitor TPN labs on Mon/Thurs, PRN  F/u toleration of diet, wean TPN as able (possibly 6/01 per GI)   Thank you for allowing pharmacy to be a part of this patient's care.  Oralee Billow, PharmD, BCCCP Clinical Pharmacist 10/25/2023 7:22 AM

## 2023-10-25 NOTE — Progress Notes (Signed)
 Physical Therapy Treatment Patient Details Name: Chase Combs MRN: [id number] DOB: 02-Jan-1946 Today's Date: 10/25/2023   History of Present Illness 78 yo male presented to [facility] ED 10/09/23 for N/V and abdominal distension. Pt with ileus, short runs of Vtach, and hypoxic respiratory failure.  10/10/23 transfer to [facility]. PICC placed 5/21. Pt required NG tube placement. PMhx: 10/01/23 Lt TKA, CAD, coronary stent, obesity, HTN, prostate CA, OSA.    PT Comments  PT pleasant with BM contained in flexiseal this session and pt able to progress gait distance. Pt continues to have LT knee flexion in stance and at rest with cues for ankle roll (pt with roll end of session) and working on SLR. Pt educated for HEP, transfers and gait.    If plan is discharge home, recommend the following: A little help with walking and/or transfers;A little help with bathing/dressing/bathroom;Assist for transportation;Help with stairs or ramp for entrance;Assistance with cooking/housework   Can travel by private vehicle        Equipment Recommendations  None recommended by PT    Recommendations for Other Services       Precautions / Restrictions Precautions Precautions: Fall;Knee;Other (comment) Precaution/Restrictions Comments: flexi seal Restrictions LLE Weight Bearing Per Provider Order: Weight bearing as tolerated Other Position/Activity Restrictions: no pillow or bolster under bend of knee at rest, OK to have under ankle to promote L knee extension     Mobility  Bed Mobility Overal bed mobility: Modified Independent             General bed mobility comments: HOB 30 degrees, increased time    Transfers Overall transfer level: Needs assistance   Transfers: Sit to/from Stand Sit to Stand: Supervision           General transfer comment: cue for hand placement from bed and to recliner    Ambulation/Gait Ambulation/Gait assistance: Supervision Gait Distance (Feet): 112 Feet Assistive  device: Rolling walker (2 wheels) Gait Pattern/deviations: Step-through pattern, Decreased stride length, Knee flexed in stance - left   Gait velocity interpretation: 1.31 - 2.62 ft/sec, indicative of limited community ambulator   General Gait Details: cues or Lt knee extension, heel strike and increased distance, reliant on RW , limited by fatigue   Stairs             Wheelchair Mobility     Tilt Bed    Modified Rankin (Stroke Patients Only)       Balance Overall balance assessment: Needs assistance   Sitting balance-Leahy Scale: Fair     Standing balance support: During functional activity, Reliant on assistive device for balance, Bilateral upper extremity supported Standing balance-Leahy Scale: Poor Standing balance comment: RW in standing                            Communication Communication Communication: No apparent difficulties  Cognition Arousal: Alert Behavior During Therapy: WFL for tasks assessed/performed   PT - Cognitive impairments: No apparent impairments                         Following commands: Intact      Cueing Cueing Techniques: Verbal cues  Exercises Total Joint Exercises Hip ABduction/ADduction: AROM, Left, 20 reps, Seated, Strengthening Straight Leg Raises: AROM, Left, 10 reps, Seated Long Arc Quad: AROM, Left, Seated, 20 reps Marching in Standing: AROM, 20 reps, Seated, Left, Strengthening    General Comments  Pertinent Vitals/Pain Pain Assessment Pain Score: 3  Pain Location: left knee and HA Pain Descriptors / Indicators: Aching Pain Intervention(s): Limited activity within patient's tolerance, Monitored during session, Repositioned    Home Living                          Prior Function            PT Goals (current goals can now be found in the care plan section) Progress towards PT goals: Progressing toward goals    Frequency    Min 2X/week      PT Plan       Co-evaluation              AM-PAC PT "6 Clicks" Mobility   Outcome Measure  Help needed turning from your back to your side while in a flat bed without using bedrails?: None Help needed moving from lying on your back to sitting on the side of a flat bed without using bedrails?: None Help needed moving to and from a bed to a chair (including a wheelchair)?: A Little Help needed standing up from a chair using your arms (e.g., wheelchair or bedside chair)?: A Little Help needed to walk in hospital room?: A Little Help needed climbing 3-5 steps with a railing? : A Lot 6 Click Score: 19    End of Session   Activity Tolerance: Patient tolerated treatment well Patient left: in chair;with call bell/phone within reach;with chair alarm set Nurse Communication: Mobility status PT Visit Diagnosis: Difficulty in walking, not elsewhere classified (R26.2);Other abnormalities of gait and mobility (R26.89)     Time: 1610-9604 PT Time Calculation (min) (ACUTE ONLY): 17 min  Charges:    $Therapeutic Activity: 8-22 mins PT General Charges $$ ACUTE PT VISIT: 1 Visit                     Annis Baseman, PT Acute Rehabilitation Services Office: [phone]    Jackey Mary Conny Situ 10/25/2023, 9:56 AM

## 2023-10-26 DIAGNOSIS — R197 Diarrhea, unspecified: Secondary | ICD-10-CM | POA: Diagnosis not present

## 2023-10-26 LAB — BASIC METABOLIC PANEL WITH GFR
Anion gap: 6 (ref 5–15)
BUN: 14 mg/dL (ref 8–23)
CO2: 25 mmol/L (ref 22–32)
Calcium: 8.5 mg/dL — ABNORMAL LOW (ref 8.9–10.3)
Chloride: 102 mmol/L (ref 98–111)
Creatinine, Ser: 0.55 mg/dL — ABNORMAL LOW (ref 0.61–1.24)
GFR, Estimated: >60 mL/min (ref >60)
Glucose, Bld: 117 mg/dL — ABNORMAL HIGH (ref 70–99)
Potassium: 4.6 mmol/L (ref 3.5–5.1)
Sodium: 133 mmol/L — ABNORMAL LOW (ref 135–145)

## 2023-10-26 LAB — GLUCOSE, CAPILLARY
Glucose-Capillary: 165 mg/dL — ABNORMAL HIGH (ref 70–99)
Glucose-Capillary: 125 mg/dL — ABNORMAL HIGH (ref 70–99)

## 2023-10-26 MED ORDER — TRAVASOL 10 % IV SOLN
INTRAVENOUS | Status: DC
  Filled 2023-10-26: qty 604.8

## 2023-10-26 NOTE — Plan of Care (Addendum)
  Problem: Education: Goal: Knowledge of General Education information will improve Description: Including pain rating scale, medication(s)/side effects and non-pharmacologic comfort measures Outcome: Progressing   Problem: Health Behavior/Discharge Planning: Goal: Ability to manage health-related needs will improve Outcome: Progressing   Problem: Clinical Measurements: Goal: Ability to maintain clinical measurements within normal limits will improve Outcome: Progressing   Problem: Health Behavior/Discharge Planning: Goal: Ability to manage health-related needs will improve Outcome: Progressing   Problem: Nutritional: Goal: Maintenance of adequate nutrition will improve Outcome: Progressing

## 2023-10-26 NOTE — Progress Notes (Signed)
 Patient ID: Chase Combs, male   DOB: 03-21-1946, 78 y.o.   MRN: [id number]    Progress Note   Subjective   Day # 16 CC; prolonged diffuse postop ileus now resolved complicated by diarrhea persistent  Metronidazole started 10/24/2023 TPN  Patient sitting up in chair, says he has felt a little bit lightheaded over the past couple of days, vague headache today, mild nausea last night, no vomiting Trying to eat solid food No complaints of abdominal pain  0 stool output via fecal management system over the past 2 shifts    Objective   Vital signs in last 24 hours: Temp:  [98 F (36.7 C)-98.7 F (37.1 C)] 98 F (36.7 C) (06/03 0720) Pulse Rate:  [87-97] 97 (06/03 1158) Resp:  [18] 18 (06/03 0547) BP: (112-129)/(73-79) 112/73 (06/03 1158) SpO2:  [94 %-98 %] 98 % (06/03 1158) Last BM Date : 10/25/23 General: Elderly white male in NAD pleasant Heart:  Regular rate and rhythm; no murmurs Lungs: Respirations even and unlabored, lungs CTA bilaterally Abdomen:  Soft, mild distention, nontender ,bowel sounds are present Extremities:  Without edema. Neurologic:  Alert and oriented,  grossly normal neurologically. Psych:  Cooperative. Normal mood and affect.  Intake/Output from previous day: 06/02 0701 - 06/03 0700 In: 1089 [P.O.:300; I.V.:789] Out: 450 [Urine:450] Intake/Output this shift: Total I/O In: -  Out: 500 [Urine:500]  Lab Results: Recent Labs    10/24/23 0142 10/25/23 0251  WBC 6.6 6.5  HGB 9.5* 9.4*  HCT 29.4* 29.7*  PLT 386 422*   BMET Recent Labs    10/24/23 0142 10/25/23 0251 10/26/23 0407  NA 133* 131* 133*  K 4.3 4.3 4.6  CL 97* 97* 102  CO2 25 25 25   GLUCOSE 134* 116* 117*  BUN 16 14 14   CREATININE 0.53* 0.57* 0.55*  CALCIUM  8.8* 8.6* 8.5*   LFT Recent Labs    10/25/23 0251  PROT 5.7*  ALBUMIN 2.2*  AST 46*  ALT 86*  ALKPHOS 250*  BILITOT 0.5   PT/INR No results for input(s): "LABPROT", "INR" in the last 72 hours.        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 with eventual improvement in ileus then developed diarrhea which has been prolonged over the past week  C. difficile negative x 2 Small sigmoidoscopy-edematous congested mucosa-biopsies with no diagnostic abnormality  Possible SIBO, versus other infectious etiology despite negative stool studies  Significant improvement in diarrhea over the past 2 days, no output over the past couple of shifts  #2 malnutrition secondary to above-tolerating solid diet though still not much appetite TPN being weaned  Plan; continue metronidazole 500 mg p.o. twice daily Soft diet Will discontinue fecal management system and continue to monitor       Principal Problem:   Hypoxia Active Problems:   Ileus, postoperative (HCC)   Fever, unspecified   Leukocytosis   Hypokalemia   Acute diarrhea   Abnormal finding on GI tract imaging   Transaminitis     LOS: 16 days   Ethyn Schetter PA-C 10/26/2023, 12:16 PM

## 2023-10-26 NOTE — Plan of Care (Signed)

## 2023-10-26 NOTE — Progress Notes (Addendum)
 PROGRESS NOTE  Chase Combs:[id number] DOB: [birth year] DOA: 10/09/2023 PCP: Ilsa Maltese, PA   LOS: 16 days   Brief narrative:  78 years old male with PMH significant for Left TKR on 10/01/2023 for osteoarthritis, CAD S/p MI s/p coronary stent x 2, who presented to [facility] with complaints of feeling unwell for 1 week since his total knee replacement.  He was noted to have distended abdomen and was tachycardic and tachypneic with distended abdomen and was hypoxic.    A CT abdomen/pelvis and CTA for Pulmonary emboli were done.  He was noted to have frequent PVCs and possible short runs of V Tach and Amiodarone  drip was started.  CT scan abd wihout definitive obstruction and CTA no Pulmonary emboli.  He was admitted for acute hypoxemic respiratory failure secondary to restricted ventilation due to markedly distended abdomen related to opiate induced ileus.  PICC line placed 5/24 and patient remains on TPN.   CT abdomen performed on 5/25 with diffuse small bowel dilation without transition point and some concern for right lung aspiration.   Assessment/Plan: Principal Problem:   Hypoxia Active Problems:   Ileus, postoperative (HCC)   Fever, unspecified   Leukocytosis   Hypokalemia   Acute diarrhea   Abnormal finding on GI tract imaging   Transaminitis   Acute hypoxemic respiratory failure: Aspiration pneumonia. Resolved.  Likely secondary to  hypoventilation from distended abdomen with possible aspiration.  CT scan showed some signs of aspiration.  Completed course of Zosyn .  On room air at this time.  Continue incentive spirometer.  Opioid Induced Ileus vs SBO Possible colitis Had prolonged ileus and  on TPN.  Gradually advancing diet and as tolerated.  Still on TPN due to ongoing diarrhea.. patient underwent flexible colonoscopy with findings of internal hemorrhoids diverticulosis and congested mucosa.   Will DC TPN when oral intake is adequate and diarrhea resolved.   Decrease half dose of TPN from yesterday.  Has been started on Flagyl for possible SIBO.  Volume of diarrhea has gone down so we will discontinue rectal tube today.  Patient was encouraged to take orally but he states that he does not feel like eating today.  New TPN for 1 more day.  CAD s/p CABG:  Frequent PVCs and NSVT HTN On  amlodipine  and metoprolol  from home.  Blood pressure seems to be stable.  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  Would benefit from lifestyle modification and weight loss as outpatient.   DVT prophylaxis: heparin  injection 5,000 Units Start: 10/10/23 1400 SCDs Start: 10/10/23 0437   Disposition: Home with home health likely on 10/27/2023 if continues to improve.  Status is: Inpatient Remains inpatient appropriate because: pending clinical improvement, TPN, resolving ileus, still with diarrhea   Code Status:     Code Status: Full Code  Family Communication:  Spoke with the patient's spouse on 10/26/23 and updated her about the clinical condition of the patient.  Consultants: GI  Procedures: NG tube placement and removal  rectal tube placement and removal of Flexible sigmoidoscopy 10/20/2023  Anti-infectives:  Zosyn  IV-completed  Subjective  Today, patient was seen and examined at bedside.  Patient does not even have appetite and does not feel like eating.  Has tolerated oral diet though.  Has had some diarrhea but has lessened.  Ambulating in the hallway.  Objective: Vitals:   10/26/23 0547 10/26/23 0720  BP: 123/79 117/75  Pulse: 89 87  Resp: 18   Temp:  98 F (36.7 C)  SpO2: 94% 97%    Intake/Output Summary (Last 24 hours) at 10/26/2023 1121 Last data filed at 10/26/2023 1000 Gross per 24 hour  Intake 1088.95 ml  Output 950 ml  Net 138.95 ml   Filed Weights   10/23/23 0500 10/24/23 0437 10/25/23 0500  Weight: 89.2 kg 90.3 kg 89.1 kg   Body mass index is 29.01 kg/m.   Physical Exam:  General: Obese built, not in obvious distress elderly male,  Communicative, oriented. HENT:   No scleral pallor or icterus noted. Oral mucosa is moist.  Chest:  Clear breath sounds.  Diminished breath sounds bilaterally. No crackles or wheezes.  CVS: S1 &S2 heard. No murmur.  Regular rate and rhythm. Abdomen: Soft,  slightly distended.  Nontender.  Bowel sounds heard, ectal tube in place. Extremities: No cyanosis, clubbing or edema.  Peripheral pulses are palpable.  PICC line in place. Psych: Alert, awake and oriented, normal mood CNS:  No cranial nerve deficits.  Power equal in all extremities.   Skin: Warm and dry.  No rashes noted.    Data Review: I have personally reviewed the following laboratory data and studies,  CBC: Recent Labs  Lab 10/21/23 0345 10/24/23 0142 10/25/23 0251  WBC 7.5 6.6 6.5  HGB 9.2* 9.5* 9.4*  HCT 30.0* 29.4* 29.7*  MCV 96.2 93.3 93.1  PLT 451* 386 422*   Basic Metabolic Panel: Recent Labs  Lab 10/20/23 0330 10/21/23 0345 10/22/23 0459 10/23/23 0316 10/24/23 0142 10/25/23 0251 10/26/23 0407  NA 140 139 137 134* 133* 131* 133*  K 5.3* 4.8 4.4 4.3 4.3 4.3 4.6  CL 112* 109 105 100 97* 97* 102  CO2 22 22 24 26 25 25 25   GLUCOSE 158* 152* 122* 141* 134* 116* 117*  BUN 26* 20 16 17 16 14 14   CREATININE 0.74 0.66 0.55* 0.57* 0.53* 0.57* 0.55*  CALCIUM  8.4* 8.8* 8.6* 8.6* 8.8* 8.6* 8.5*  MG 2.3 1.9  --   --  1.7 2.1  --   PHOS  --  3.5  --   --   --  3.9  --    Liver Function Tests: Recent Labs  Lab 10/21/23 0345 10/25/23 0251  AST 45* 46*  ALT 91* 86*  ALKPHOS 161* 250*  BILITOT 0.4 0.5  PROT 5.7* 5.7*  ALBUMIN 2.2* 2.2*   No results for input(s): "LIPASE", "AMYLASE" in the last 168 hours. No results for input(s): "AMMONIA" in the last 168 hours. Cardiac Enzymes: No results for input(s): "CKTOTAL", "CKMB", "CKMBINDEX", "TROPONINI" in the last 168 hours. BNP (last 3 results) Recent Labs    10/10/23 0619  BNP 373.7*    ProBNP (last 3 results) No results for input(s): "PROBNP" in the last  8760 hours.  CBG: Recent Labs  Lab 10/24/23 2043 10/25/23 0036 10/25/23 0635 10/25/23 1135 10/25/23 2121  GLUCAP 114* 121* 123* 181* 115*   Recent Results (from the past 240 hours)  C Difficile Quick Screen (NO PCR Reflex)     Status: None   Collection Time: 10/16/23 12:31 PM   Specimen: STOOL  Result Value Ref Range Status   C Diff antigen NEGATIVE NEGATIVE Final   C Diff toxin NEGATIVE NEGATIVE Final   C Diff interpretation No C. difficile detected.  Final    Comment: Performed at [facility], [address] [place], [place] [address]     Studies: No results found.     Reanne Nellums, MD  [facility] 10/26/2023  If 7PM-7AM, please contact  night-coverage

## 2023-10-26 NOTE — Progress Notes (Signed)
 PHARMACY - TOTAL PARENTERAL NUTRITION CONSULT NOTE   Indication: Prolonged ileus  Patient Measurements: Height: 5\' 9"  (175.3 cm) Weight: 89.1 kg (196 lb 6.9 oz) IBW/kg (Calculated) : 70.7 TPN AdjBW (KG): 75.4Ht 69 in Body mass index is 29.01 kg/m. Usual Weight: 98.3 kg  Assessment:  [name] who presents with abdominal distention in the setting of prolonged ileus. Patient had surgery on his knee on May 9 and was taking oxycodone  at home for pain. Endorses that intake has declined significantly since his procedure. Prior to procedure he was eating 3 meals a day which consisted of coffee and toast for breakfast, a veggie platter and meat for lunch, and a dinner prepared by his wife. Does not remember intake after surgery very well but states he was eating very little as nothing was appealing to him.   Has been receiving Relistor , Reglan , and neostigmine  in combination with SMOG enemas, however has persistent bowel dilation. No bowel sounds present on physical exam. Pharmacy has been consulted to initiate TPN in setting of prolonged ileus and minimal intake.   5/23 - unable to use PICC due to placement. TPN not given, D10 @ 61ml/hr overnight. 5/24 - New PICC placed and TPN started at 1000  Potassium up 5.3 on 5/28 AM. TPN held from 10AM-6PM, started D10 at 90 ml/hr. TPN with reduced potassium started 5/28 @6PM .   Glucose / Insulin : CBGs <180, 3 units of SSI utilized past 24 hours and 20 units insulin  in TPN Electrolytes: Na 133, K 4.6 (goal >4 with ileus), Mg 2.1 (Goal >2 w/ ileus), Cl 97, CoCa ~10 Renal: Scr <1, BUN 14 Hepatic: AlkPhos up 250, Tbili wnl; AST/ALST 46/86. TG 221, albumin 2.2 Intake / Output; MIVF:no charted NG output. UOP 0.2 ml/kg/hr, 900 mL stool   GI Imaging:  5/21 KUB: progressive adynamic ileus 5/23 Abd xray: unchanged diffuse gaseous small bowel distension, likely ileus  5/24 Abd xray: Severely dilated loops of air-filled small bowel throughout the mid abdomen with  minimal colonic gas 5/25 CT Abd: distended fluid-filled loops of small bowel w/ multiple gas fluid levels, most pronounced within jejunum. No abrupt transition points suggestive of obstruction- findings could reflect sequela of ileus or diarrheal illness.  GI Surgeries / Procedures:  5/28- sigmoidoscopy  Central access: PICC (placed 5/21), replaced 5/24 TPN start date: 5/21  Nutritional Goals: Goal TPN rate is 90 mL/hr (provides 121 g of protein and 2200 kcals per day)  RD Assessment: Estimated Needs Total Energy Estimated Needs: 2200-2400 Total Protein Estimated Needs: 110-130 grams Total Fluid Estimated Needs: >/= 2 L  Current Nutrition:  5/21 TPN 5/29 LCD 5/30 FLD 5/31 soft diet  Plan:  Continue at TPN at goal rate of 45 mL/hr at 1800 as discussed w MD this morning. This will provide 50% of pt's estimated needs. Electrolytes in TPN: Na 65 mEq/L, K 30 mEq/L, Ca 5 mEq/L, Mg 5 mEq/L, and Phos 48mmol/L. Cl:Ac 1:1 Add standard MVI and trace elements to TPN Continue insulin  22 units in TPN Change to Moderate q6h SSI and adjust as needed  Monitor TPN labs on Mon/Thurs, PRN  F/u toleration of diet, wean TPN as able   Thank you for allowing pharmacy to be a part of this patient's care.  Oralee Billow, PharmD, BCCCP Clinical Pharmacist 10/26/2023 7:48 AM

## 2023-10-26 NOTE — Progress Notes (Signed)
 Mobility Specialist: Progress Note   10/26/23 1220  Mobility  Activity Ambulated with assistance in hallway  Level of Assistance Contact guard assist, steadying assist  Assistive Device Front wheel walker  Distance Ambulated (ft) 90 ft  RLE Weight Bearing Per Provider Order WBAT  LLE Weight Bearing Per Provider Order WBAT  Activity Response Tolerated well  Mobility Referral Yes  Mobility visit 1 Mobility  Mobility Specialist Start Time (ACUTE ONLY) 1208  Mobility Specialist Stop Time (ACUTE ONLY) 1215  Mobility Specialist Time Calculation (min) (ACUTE ONLY) 7 min    Pt agreeable to mobility session, received in chair. CG throughout. C/o dizziness rated 5/10 which limited distance of ambulation. Also c/o aching knee pain. Returned to room without fault. Left in chair with all needs met, call bell in reach.   Deloria Fetch Mobility Specialist Please contact via SecureChat or Rehab office at [phone]

## 2023-10-27 DIAGNOSIS — R0902 Hypoxemia: Secondary | ICD-10-CM | POA: Diagnosis not present

## 2023-10-27 LAB — CBC
HCT: 31.2 % — ABNORMAL LOW (ref 39.0–52.0)
Hemoglobin: 10 g/dL — ABNORMAL LOW (ref 13.0–17.0)
MCH: 30.3 pg (ref 26.0–34.0)
MCHC: 32.1 g/dL (ref 30.0–36.0)
MCV: 94.5 fL (ref 80.0–100.0)
Platelets: 382 10*3/uL (ref 150–400)
RBC: 3.3 MIL/uL — ABNORMAL LOW (ref 4.22–5.81)
RDW: 17.2 % — ABNORMAL HIGH (ref 11.5–15.5)
WBC: 5.2 10*3/uL (ref 4.0–10.5)
nRBC: 0 % (ref 0.0–0.2)

## 2023-10-27 LAB — BASIC METABOLIC PANEL WITH GFR
Anion gap: 8 (ref 5–15)
BUN: 19 mg/dL (ref 8–23)
CO2: 23 mmol/L (ref 22–32)
Calcium: 8.9 mg/dL (ref 8.9–10.3)
Chloride: 102 mmol/L (ref 98–111)
Creatinine, Ser: 0.55 mg/dL — ABNORMAL LOW (ref 0.61–1.24)
GFR, Estimated: >60 mL/min (ref >60)
Glucose, Bld: 121 mg/dL — ABNORMAL HIGH (ref 70–99)
Potassium: 4.6 mmol/L (ref 3.5–5.1)
Sodium: 133 mmol/L — ABNORMAL LOW (ref 135–145)

## 2023-10-27 LAB — GLUCOSE, CAPILLARY
Glucose-Capillary: 110 mg/dL — ABNORMAL HIGH (ref 70–99)
Glucose-Capillary: 114 mg/dL — ABNORMAL HIGH (ref 70–99)

## 2023-10-27 MED ORDER — MUSCLE RUB 10-15 % EX CREA: 1.0000 | TOPICAL_CREAM | CUTANEOUS | 0 refills | Status: DC | PRN

## 2023-10-27 MED ORDER — TRAMADOL HCL 50 MG PO TABS: 50.0000 mg | ORAL_TABLET | Freq: Four times a day (QID) | ORAL | 0 refills | Status: DC | PRN

## 2023-10-27 MED ORDER — METRONIDAZOLE 500 MG PO TABS: 500.0000 mg | ORAL_TABLET | Freq: Two times a day (BID) | ORAL | 0 refills | Status: AC

## 2023-10-27 MED ORDER — INSULIN ASPART 100 UNIT/ML IJ SOLN: 0.0000 [IU] | Freq: Three times a day (TID) | INTRAMUSCULAR | Status: DC

## 2023-10-27 MED ORDER — SIMETHICONE 80 MG PO CHEW: 80.0000 mg | CHEWABLE_TABLET | Freq: Four times a day (QID) | ORAL | 0 refills | Status: DC | PRN

## 2023-10-27 MED ORDER — ENSURE PLUS HIGH PROTEIN PO LIQD: 237.0000 mL | Freq: Two times a day (BID) | ORAL | Status: DC

## 2023-10-27 NOTE — Progress Notes (Signed)
PICC line was removed per order with no complications.  Pt supine and suspended inspiration during removal with instructions to remain supine for 30 minutes after removal.  Pressure held to achieve hemostasis.  Vaseline/gauze/tegaderm applied.  Patient education provided regarding lifting restrictions, site care, and signs of infection.  Pt verbalized understanding and had no questions.

## 2023-10-27 NOTE — TOC Transition Note (Signed)
 Transition of Care [facility]) - Discharge Note   Patient Details  Name: Chase Combs MRN: [id number] Date of Birth: [birth year]  Transition of [facility]) CM/SW Contact:  Dane Dung, RN Phone Number: 10/27/2023, 11:06 AM   Clinical Narrative:    CM met with the patient at the bedside to discuss TOC to return home with wife today.  PICC line and TPN have been discontinued.  I called and spoke with [facility] and they are aware that patient is being discharged home today and will reach out to schedule start of [facility] services.  HH orders are in place and signed by the MD.         Patient Goals and CMS Choice            Discharge Placement                       Discharge Plan and Services Additional resources added to the After Visit Summary for                                       Social Drivers of Health (SDOH) Interventions SDOH Screenings   Food Insecurity: Patient Declined (10/11/2023)  Housing: Patient Declined (10/11/2023)  Transportation Needs: Patient Declined (10/11/2023)  Utilities: Patient Declined (10/11/2023)  Social Connections: Patient Declined (10/11/2023)  Tobacco Use: Low Risk  (10/20/2023)     Readmission Risk Interventions    10/21/2023   11:36 AM  Readmission Risk Prevention Plan  Transportation Screening Complete  PCP or Specialist Appt within 5-7 Days Complete  Home Care Screening Complete  Medication Review (RN CM) Complete

## 2023-10-27 NOTE — Plan of Care (Signed)

## 2023-10-27 NOTE — Discharge Summary (Signed)
 Physician Discharge Summary  Chase Combs:[id number] DOB: [birth year] DOA: 10/09/2023  PCP: Ilsa Maltese, PA  Admit date: 10/09/2023 Discharge date: 10/27/2023  Admitted From: Home  Discharge disposition: Home with home health   Recommendations for Outpatient Follow-Up:   Follow up with your primary care provider in one week.  Check CBC, BMP, magnesium in the next visit Follow-up with GI as outpatient as has been scheduled on 11/29/2023. Continue follow-up with orthopedics as outpatient for knee surgery and continue PT as outpatient.  Discharge Diagnosis:   Principal Problem:   Hypoxia Active Problems:   Ileus, postoperative (HCC)   Fever, unspecified   Leukocytosis   Hypokalemia   Acute diarrhea   Abnormal finding on GI tract imaging   Transaminitis   Discharge Condition: Improved.  Diet recommendation:  soft diet  Wound care: None.  Code status: Full.   History of Present Illness:   78 years old male with PMH significant for Left TKR on 10/01/2023 for osteoarthritis, CAD S/p MI s/p coronary stent x 2, who presented to [facility] with complaints of feeling unwell for 1 week since his total knee replacement. He was noted to have distended abdomen and was tachycardic and tachypneic with distended abdomen and was hypoxic. A CT abdomen/pelvis and CTA for Pulmonary emboli were done. He was noted to have frequent PVCs and possible short runs of V Tach and Amiodarone  drip was started. CT scan abd wihout definitive obstruction and CTA no Pulmonary emboli. He was admitted for acute hypoxemic respiratory failure secondary to restricted ventilation due to markedly distended abdomen related to opiate induced ileus. PICC line placed 5/24 and patient remains on TPN. CT abdomen performed on 5/25 with diffuse small bowel dilation without transition point and some concern for right lung aspiration.    Hospital Course:   Following conditions were addressed during  hospitalization as listed below,  Acute hypoxemic respiratory failure: Aspiration pneumonia. Resolved.  Likely secondary to  hypoventilation from distended abdomen with possible aspiration.  CT scan showed some signs of aspiration.  Completed course of Zosyn .  On room air at this time   Opioid Induced Ileus vs SBO Possible colitis Had prolonged ileus and received TPN.  GI followed the patient during hospitalization and patient was gradually advanced on diet which he has tolerated at this time. Patient underwent flexible colonoscopy with findings of internal hemorrhoids diverticulosis and congested mucosa.   He continued to have diarrhea so GI recommended Flagyl for possible SIBO.  At this time diarrhea has significantly decreased and patient has been tolerating oral diet.  TPN was discontinued and patient has been considered stable for disposition home.  Communicated with GI and recommendation is for total 14-day course of Flagyl and GI to follow-up as outpatient.  Advised against narcotics though he has tolerated tramadol  well.  History of recent knee surgery.  Follow-up with orthopedics as outpatient.  CAD s/p CABG:  Frequent PVCs and NSVT HTN On  amlodipine  and metoprolol , continue discharge.   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  Would benefit from lifestyle modification and weight loss as outpatient.   Disposition.  At this time, patient is stable for disposition home with outpatient GI and PCP follow-up.  Medical Consultants:   GI  Procedures:    NG tube placement and removal  rectal tube placement and removal  Flexible sigmoidoscopy 10/20/2023  Subjective:   Today, patient was seen and examined at bedside.  States that he was able to tolerate his dinner and  breakfast this morning.  Had a little smear of loose stool yesterday but otherwise feels fine.  Wants to go home.  Discharge Exam:   Vitals:   10/27/23 0558 10/27/23 0736  BP: 132/89 132/73  Pulse: 88 91  Resp: 18    Temp: 97.7 F (36.5 C) 98.3 F (36.8 C)  SpO2: 95% 93%   Vitals:   10/26/23 1653 10/26/23 2018 10/27/23 0558 10/27/23 0736  BP: 114/74 129/63 132/89 132/73  Pulse: 86 88 88 91  Resp:  18 18   Temp: 98.6 F (37 C) 98.2 F (36.8 C) 97.7 F (36.5 C) 98.3 F (36.8 C)  TempSrc:  Oral Oral   SpO2: 94% 95% 95% 93%  Weight:      Height:       Body mass index is 29.01 kg/m.   General: Alert awake, not in obvious distress HENT: pupils equally reacting to light,  No scleral pallor or icterus noted. Oral mucosa is moist.  Chest:    Diminished breath sounds bilaterally. No crackles or wheezes.  CVS: S1 &S2 heard. No murmur.  Regular rate and rhythm. Abdomen: Soft, nontender, mild distended.  Bowel sounds are heard.   Extremities: No cyanosis, clubbing or edema.  Peripheral pulses are palpable.  Knee scar from recent surgery. Psych: Alert, awake and oriented, normal mood CNS:  No cranial nerve deficits.  Power equal in all extremities.   Skin: Warm and dry.  No rashes noted.  The results of significant diagnostics from this hospitalization (including imaging, microbiology, ancillary and laboratory) are listed below for reference.     Diagnostic Studies:   CT Angio Chest PE W and/or Wo Contrast Result Date: 10/10/2023 CLINICAL DATA:  Suspected pulmonary embolism. EXAM: CT ANGIOGRAPHY CHEST WITH CONTRAST TECHNIQUE: Multidetector CT imaging of the chest was performed using the standard protocol during bolus administration of intravenous contrast. Multiplanar CT image reconstructions and MIPs were obtained to evaluate the vascular anatomy. RADIATION DOSE REDUCTION: This exam was performed according to the departmental dose-optimization program which includes automated exposure control, adjustment of the mA and/or kV according to patient size and/or use of iterative reconstruction technique. CONTRAST:  75mL OMNIPAQUE  IOHEXOL  350 MG/ML SOLN COMPARISON:  Oct 09, 2023 FINDINGS: Cardiovascular:  There is moderate to marked severity calcification of the thoracic aorta. Satisfactory opacification of the pulmonary arteries to the segmental level. No evidence of pulmonary embolism. Normal heart size with marked severity coronary artery calcification. No pericardial effusion. Mediastinum/Nodes: No enlarged mediastinal, hilar, or axillary lymph nodes. Thyroid  gland, trachea, and esophagus demonstrate no significant findings. Lungs/Pleura: Mild to moderate severity lingular, right middle lobe and bilateral lower lobe scarring and/or atelectasis is noted. No pleural effusion or pneumothorax is identified. Upper Abdomen: There is a small hiatal hernia. Musculoskeletal: No chest wall abnormality. No acute or significant osseous findings. Review of the MIP images confirms the above findings. IMPRESSION: 1. No evidence of pulmonary embolism. 2. Mild to moderate severity lingular, right middle lobe and bilateral lower lobe scarring and/or atelectasis. 3. Small hiatal hernia. 4. Aortic atherosclerosis. Electronically Signed   By: Virgle Grime M.D.   On: 10/10/2023 00:28   CT CHEST ABDOMEN PELVIS W CONTRAST Result Date: 10/09/2023 CLINICAL DATA:  Possible sepsis with nausea and vomiting for 2 days EXAM: CT CHEST, ABDOMEN, AND PELVIS WITH CONTRAST TECHNIQUE: Multidetector CT imaging of the chest, abdomen and pelvis was performed following the standard protocol during bolus administration of intravenous contrast. RADIATION DOSE REDUCTION: This exam was performed according to the departmental  dose-optimization program which includes automated exposure control, adjustment of the mA and/or kV according to patient size and/or use of iterative reconstruction technique. CONTRAST:  OMNIPAQUE  IOHEXOL  300 MG/ML  SOLN COMPARISON:  None Available. FINDINGS: CT CHEST FINDINGS Cardiovascular: Atherosclerotic calcifications of the thoracic aorta are noted. No aneurysmal dilatation is seen. Heart is at the upper limits of  normal in size. Coronary calcifications are noted. Pulmonary artery shows no large central pulmonary embolus. Mediastinum/Nodes: Thoracic inlet is within normal limits. The esophagus is unremarkable. No hilar or mediastinal adenopathy is seen. Lungs/Pleura: The lungs are well aerated bilaterally. No focal infiltrate or effusion is noted. Minimal right basilar atelectasis is seen. Musculoskeletal: No chest wall mass or suspicious bone lesions identified. CT ABDOMEN PELVIS FINDINGS Hepatobiliary: No focal liver abnormality is seen. No gallstones, gallbladder wall thickening, or biliary dilatation. Pancreas: Unremarkable. No pancreatic ductal dilatation or surrounding inflammatory changes. Spleen: Normal in size without focal abnormality. Adrenals/Urinary Tract: Adrenal glands are within normal limits. Kidneys demonstrate bilateral renal cystic change stable in appearance. No follow-up is recommended. Tiny nonobstructing right renal stone is seen. Ureters are within normal limits. The bladder is predominately decompressed. Indentation upon the inferior aspect of the bladder is noted secondary to the prostate gland. 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 Stomach/Bowel: Mild diverticular change of the colon is noted. Fluid is noted throughout the colon likely related to a diarrheal state. The transverse colon is somewhat distended although no true obstructive changes are seen. The appendix is not well visualized and may have been surgically removed. Stomach is also significantly distended with fluid. Small bowel demonstrates a few mildly dilated fluid-filled distal small bowel loops without definitive obstructing lesion. This may also be related to some enteritis and a diarrheal state. Vascular/Lymphatic: Aortic atherosclerosis. No enlarged abdominal or pelvic lymph nodes. Reproductive: Fiducial markers are noted within the prostate. Other: Small fat containing umbilical hernia is noted stable  from the prior exam. No bowel is noted within. Musculoskeletal: No acute or significant osseous findings. IMPRESSION: CT of the chest: No acute abnormality noted. CT of the abdomen and pelvis: Nonobstructing right renal stone. Prominent prostate extending into the inferior aspect of the bladder. 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 Diverticulosis without diverticulitis. Fluid-filled loops of large and small bowel without definitive obstructive change likely related to a diarrheal state. Electronically Signed   By: Violeta Grey M.D.   On: 10/09/2023 22:59     Labs:   Basic Metabolic Panel: Recent Labs  Lab 10/21/23 0345 10/22/23 0459 10/23/23 0316 10/24/23 0142 10/25/23 0251 10/26/23 0407 10/27/23 0338  NA 139   < > 134* 133* 131* 133* 133*  K 4.8   < > 4.3 4.3 4.3 4.6 4.6  CL 109   < > 100 97* 97* 102 102  CO2 22   < > 26 25 25 25 23   GLUCOSE 152*   < > 141* 134* 116* 117* 121*  BUN 20   < > 17 16 14 14 19   CREATININE 0.66   < > 0.57* 0.53* 0.57* 0.55* 0.55*  CALCIUM  8.8*   < > 8.6* 8.8* 8.6* 8.5* 8.9  MG 1.9  --   --  1.7 2.1  --   --   PHOS 3.5  --   --   --  3.9  --   --    < > = values in this interval not displayed.   GFR Estimated Creatinine Clearance: 86.8  mL/min (A) (by C-G formula based on SCr of 0.55 mg/dL (L)). Liver Function Tests: Recent Labs  Lab 10/21/23 0345 10/25/23 0251  AST 45* 46*  ALT 91* 86*  ALKPHOS 161* 250*  BILITOT 0.4 0.5  PROT 5.7* 5.7*  ALBUMIN 2.2* 2.2*   No results for input(s): "LIPASE", "AMYLASE" in the last 168 hours. No results for input(s): "AMMONIA" in the last 168 hours. Coagulation profile No results for input(s): "INR", "PROTIME" in the last 168 hours.  CBC: Recent Labs  Lab 10/21/23 0345 10/24/23 0142 10/25/23 0251 10/27/23 0338  WBC 7.5 6.6 6.5 5.2  HGB 9.2* 9.5* 9.4* 10.0*  HCT 30.0* 29.4* 29.7* 31.2*  MCV 96.2 93.3 93.1 94.5  PLT 451* 386 422* 382   Cardiac Enzymes: No results for  input(s): "CKTOTAL", "CKMB", "CKMBINDEX", "TROPONINI" in the last 168 hours. BNP: Invalid input(s): "POCBNP" CBG: Recent Labs  Lab 10/25/23 2121 10/26/23 1321 10/26/23 1652 10/27/23 0030 10/27/23 0810  GLUCAP 115* 165* 125* 114* 110*   D-Dimer No results for input(s): "DDIMER" in the last 72 hours. Hgb A1c No results for input(s): "HGBA1C" in the last 72 hours. Lipid Profile Recent Labs    10/25/23 0251  TRIG 221*   Thyroid  function studies No results for input(s): "TSH", "T4TOTAL", "T3FREE", "THYROIDAB" in the last 72 hours.  Invalid input(s): "FREET3" Anemia work up No results for input(s): "VITAMINB12", "FOLATE", "FERRITIN", "TIBC", "IRON", "RETICCTPCT" in the last 72 hours. Microbiology No results found for this or any previous visit (from the past 240 hours).   Discharge Instructions:   Discharge Instructions     Call MD for:  persistant nausea and vomiting   Complete by: As directed    Call MD for:  severe uncontrolled pain   Complete by: As directed    Call MD for:  temperature >100.4   Complete by: As directed    Discharge instructions   Complete by: As directed    Follow-up with your primary care provider in 1 week.  Check blood work at that time.  Seek medical attention for worsening symptoms. Follow up with GI as scheduled by the clinic.   Increase activity slowly   Complete by: As directed       Allergies as of 10/27/2023       Reactions   Codeine Nausea And Vomiting   Latex Rash   "Oral blisters when used at dentist"        Medication List     STOP taking these medications    furosemide  20 MG tablet Commonly known as: LASIX    ibuprofen  200 MG tablet Commonly known as: ADVIL    methocarbamol  500 MG tablet Commonly known as: ROBAXIN    oxyCODONE  5 MG immediate release tablet Commonly known as: Roxicodone        TAKE these medications    ALKA-SELTZER ANTACID PO Take 1 tablet by mouth 2 (two) times daily as needed  (Indigestion).   amLODipine  10 MG tablet Commonly known as: NORVASC  TAKE 1 TABLET BY MOUTH EVERY DAY   aspirin  EC 81 MG tablet Take 1 tablet (81 mg total) by mouth in the morning and at bedtime.   Cannabidiol  Powd Apply 1 Application topically 3 (three) times daily as needed (knee pain.). CBD cream   feeding supplement Liqd Take 237 mLs by mouth 2 (two) times daily between meals.   metoprolol  succinate 25 MG 24 hr tablet Commonly known as: TOPROL -XL Take 0.5 tablets (12.5 mg total) by mouth daily.   metroNIDAZOLE 500 MG tablet  Commonly known as: FLAGYL Take 1 tablet (500 mg total) by mouth 2 (two) times daily for 11 days.   multivitamin with minerals Tabs tablet Take 1 tablet by mouth in the morning.   Muscle Rub 10-15 % Crea Apply 1 Application topically as needed for muscle pain.   ondansetron  4 MG tablet Commonly known as: Zofran  Take 1 tablet (4 mg total) by mouth every 8 (eight) hours as needed for nausea, vomiting or refractory nausea / vomiting.   rosuvastatin  20 MG tablet Commonly known as: CRESTOR  Take 1 tablet (20 mg total) by mouth daily. What changed: when to take this   simethicone  80 MG chewable tablet Commonly known as: MYLICON Chew 1 tablet (80 mg total) by mouth 4 (four) times daily as needed for flatulence.   tamsulosin  0.4 MG Caps capsule Commonly known as: Flomax  Take 1 capsule (0.4 mg total) by mouth daily after supper.   traMADol  50 MG tablet Commonly known as: ULTRAM  Take 1 tablet (50 mg total) by mouth every 6 (six) hours as needed for moderate pain (pain score 4-6) or severe pain (pain score 7-10).   valsartan  320 MG tablet Commonly known as: DIOVAN  TAKE 1 TABLET BY MOUTH EVERY DAY        Follow-up Information     Turmel, Caleb P, PA Follow up.   Specialty: Physician Assistant Contact information: [address] [place] [address] [phone]         Care, [facility] Follow up.   Specialty: Home  Health Services Why: [facility] health will provide home health services once you are discharged to home. Contact information: [address] [place] [address] [phone]         Brigitte Canard, PA-C. Go to.   Specialty: Physician Assistant Why: 11/29/23 @ 8:20 with Brigitte Canard, PA-C Contact information: [address] [place] [address] [phone]                  Time coordinating discharge: 39 minutes  Signed:  Josetta Wigal  [facility] 10/27/2023, 3:45 PM

## 2023-10-27 NOTE — Progress Notes (Addendum)
 PHARMACY - TOTAL PARENTERAL NUTRITION CONSULT NOTE   Indication: Prolonged ileus  Patient Measurements: Height: 5\' 9"  (175.3 cm) Weight: 89.1 kg (196 lb 6.9 oz) IBW/kg (Calculated) : 70.7 TPN AdjBW (KG): 75.4Ht 69 in Body mass index is 29.01 kg/m. Usual Weight: 98.3 kg  Assessment:  [name] who presents with abdominal distention in the setting of prolonged ileus. Patient had surgery on his knee on May 9 and was taking oxycodone  at home for pain. Endorses that intake has declined significantly since his procedure. Prior to procedure he was eating 3 meals a day which consisted of coffee and toast for breakfast, a veggie platter and meat for lunch, and a dinner prepared by his wife. Does not remember intake after surgery very well but states he was eating very little as nothing was appealing to him.   Has been receiving Relistor , Reglan , and neostigmine  in combination with SMOG enemas, however has persistent bowel dilation. No bowel sounds present on physical exam. Pharmacy has been consulted to initiate TPN in setting of prolonged ileus and minimal intake.   5/23 - unable to use PICC due to placement. TPN not given, D10 @ 74ml/hr overnight. 5/24 - New PICC placed and TPN started at 1000  Potassium up 5.3 on 5/28 AM. TPN held from 10AM-6PM, started D10 at 90 ml/hr. TPN with reduced potassium started 5/28 @6PM .   Glucose / Insulin : CBGs <180, A1C 6.4, used 5 units of SSI/24 hours and 22 units insulin  in TPN Electrolytes: Na 133, K 4.6 (goal >4 with ileus), last Mg 2.1 (Goal >2 w/ ileus),  CoCa 10.3, others wnl Renal: Scr 0.55, BUN wnl Hepatic: AlkPhos up 250, Tbili wnl; AST/ALST 46/86. TG 221, albumin 2.2 Intake / Output; MIVF: UOP 0.5 ml/kg/hr, FMS > removed  GI Imaging:  5/21 KUB: progressive adynamic ileus 5/23 Abd xray: unchanged diffuse gaseous small bowel distension, likely ileus  5/24 Abd xray: Severely dilated loops of air-filled small bowel throughout the mid abdomen with  minimal colonic gas 5/25 CT Abd: distended fluid-filled loops of small bowel w/ multiple gas fluid levels. No abrupt transition points suggestive of obstruction. Likely ileus or diarrheal illness.  GI Surgeries / Procedures:  5/28- sigmoidoscopy  Central access: PICC (placed 5/21), replaced 5/24 TPN start date: 5/21  Nutritional Goals: Goal TPN rate is 90 mL/hr (provides 121 g of protein and 2200 kcals per day)  RD Assessment: Estimated Needs Total Energy Estimated Needs: 2200-2400 Total Protein Estimated Needs: 110-130 grams Total Fluid Estimated Needs: >/= 2 L  Current Nutrition:  5/21 TPN 5/29 LCD 5/30 FLD 5/31 soft diet 6/3 ate half portion broccoli and mashed potatoes, did not like the chicken salad or roast beef, 1 Ensure 6/4 ate 3 fruit cups, chocolate milk. No pain or pressure. Would eat more but does not like food. Encouraged family to bring in foods he likes.   Plan:  Stop TPN per team  Change to Moderate  SSI TID [facility]       Dorene Gang, PharmD, BCPS, [facility] Clinical Pharmacist  Please check AMION for all [facility] Pharmacy phone numbers After 10:00 PM, call Main Pharmacy [phone]

## 2023-10-29 DIAGNOSIS — R338 Other retention of urine: Secondary | ICD-10-CM | POA: Diagnosis not present

## 2023-10-29 DIAGNOSIS — Z9181 History of falling: Secondary | ICD-10-CM | POA: Diagnosis not present

## 2023-10-29 DIAGNOSIS — R7303 Prediabetes: Secondary | ICD-10-CM | POA: Diagnosis not present

## 2023-10-29 DIAGNOSIS — M19042 Primary osteoarthritis, left hand: Secondary | ICD-10-CM | POA: Diagnosis not present

## 2023-10-29 DIAGNOSIS — M19041 Primary osteoarthritis, right hand: Secondary | ICD-10-CM | POA: Diagnosis not present

## 2023-10-29 DIAGNOSIS — G4733 Obstructive sleep apnea (adult) (pediatric): Secondary | ICD-10-CM | POA: Diagnosis not present

## 2023-10-29 DIAGNOSIS — I493 Ventricular premature depolarization: Secondary | ICD-10-CM | POA: Diagnosis not present

## 2023-10-29 DIAGNOSIS — I2510 Atherosclerotic heart disease of native coronary artery without angina pectoris: Secondary | ICD-10-CM | POA: Diagnosis not present

## 2023-10-29 DIAGNOSIS — E785 Hyperlipidemia, unspecified: Secondary | ICD-10-CM | POA: Diagnosis not present

## 2023-10-29 DIAGNOSIS — Z955 Presence of coronary angioplasty implant and graft: Secondary | ICD-10-CM | POA: Diagnosis not present

## 2023-10-29 DIAGNOSIS — I252 Old myocardial infarction: Secondary | ICD-10-CM | POA: Diagnosis not present

## 2023-10-29 DIAGNOSIS — N179 Acute kidney failure, unspecified: Secondary | ICD-10-CM | POA: Diagnosis not present

## 2023-10-29 DIAGNOSIS — I10 Essential (primary) hypertension: Secondary | ICD-10-CM | POA: Diagnosis not present

## 2023-10-29 DIAGNOSIS — E119 Type 2 diabetes mellitus without complications: Secondary | ICD-10-CM | POA: Diagnosis not present

## 2023-10-29 DIAGNOSIS — Z7982 Long term (current) use of aspirin: Secondary | ICD-10-CM | POA: Diagnosis not present

## 2023-10-29 DIAGNOSIS — Z96652 Presence of left artificial knee joint: Secondary | ICD-10-CM | POA: Diagnosis not present

## 2023-10-29 DIAGNOSIS — Z79891 Long term (current) use of opiate analgesic: Secondary | ICD-10-CM | POA: Diagnosis not present

## 2023-10-29 DIAGNOSIS — Z471 Aftercare following joint replacement surgery: Secondary | ICD-10-CM | POA: Diagnosis not present

## 2023-11-02 DIAGNOSIS — Z9181 History of falling: Secondary | ICD-10-CM | POA: Diagnosis not present

## 2023-11-02 DIAGNOSIS — R338 Other retention of urine: Secondary | ICD-10-CM | POA: Diagnosis not present

## 2023-11-02 DIAGNOSIS — I2510 Atherosclerotic heart disease of native coronary artery without angina pectoris: Secondary | ICD-10-CM | POA: Diagnosis not present

## 2023-11-02 DIAGNOSIS — I493 Ventricular premature depolarization: Secondary | ICD-10-CM | POA: Diagnosis not present

## 2023-11-02 DIAGNOSIS — M19042 Primary osteoarthritis, left hand: Secondary | ICD-10-CM | POA: Diagnosis not present

## 2023-11-02 DIAGNOSIS — Z471 Aftercare following joint replacement surgery: Secondary | ICD-10-CM | POA: Diagnosis not present

## 2023-11-02 DIAGNOSIS — Z96652 Presence of left artificial knee joint: Secondary | ICD-10-CM | POA: Diagnosis not present

## 2023-11-02 DIAGNOSIS — M19041 Primary osteoarthritis, right hand: Secondary | ICD-10-CM | POA: Diagnosis not present

## 2023-11-02 DIAGNOSIS — N179 Acute kidney failure, unspecified: Secondary | ICD-10-CM | POA: Diagnosis not present

## 2023-11-02 DIAGNOSIS — G4733 Obstructive sleep apnea (adult) (pediatric): Secondary | ICD-10-CM | POA: Diagnosis not present

## 2023-11-02 DIAGNOSIS — Z955 Presence of coronary angioplasty implant and graft: Secondary | ICD-10-CM | POA: Diagnosis not present

## 2023-11-02 DIAGNOSIS — I10 Essential (primary) hypertension: Secondary | ICD-10-CM | POA: Diagnosis not present

## 2023-11-02 DIAGNOSIS — E119 Type 2 diabetes mellitus without complications: Secondary | ICD-10-CM | POA: Diagnosis not present

## 2023-11-02 DIAGNOSIS — E785 Hyperlipidemia, unspecified: Secondary | ICD-10-CM | POA: Diagnosis not present

## 2023-11-02 DIAGNOSIS — I252 Old myocardial infarction: Secondary | ICD-10-CM | POA: Diagnosis not present

## 2023-11-02 DIAGNOSIS — Z79891 Long term (current) use of opiate analgesic: Secondary | ICD-10-CM | POA: Diagnosis not present

## 2023-11-02 DIAGNOSIS — Z7982 Long term (current) use of aspirin: Secondary | ICD-10-CM | POA: Diagnosis not present

## 2023-11-05 DIAGNOSIS — J690 Pneumonitis due to inhalation of food and vomit: Secondary | ICD-10-CM | POA: Diagnosis not present

## 2023-11-05 DIAGNOSIS — L8990 Pressure ulcer of unspecified site, unspecified stage: Secondary | ICD-10-CM | POA: Diagnosis not present

## 2023-11-05 DIAGNOSIS — R0902 Hypoxemia: Secondary | ICD-10-CM | POA: Diagnosis not present

## 2023-11-05 DIAGNOSIS — Z742 Need for assistance at home and no other household member able to render care: Secondary | ICD-10-CM | POA: Diagnosis not present

## 2023-11-05 DIAGNOSIS — I10 Essential (primary) hypertension: Secondary | ICD-10-CM | POA: Diagnosis not present

## 2023-11-22 DIAGNOSIS — Z96652 Presence of left artificial knee joint: Secondary | ICD-10-CM | POA: Diagnosis not present

## 2023-11-26 ENCOUNTER — Other Ambulatory Visit: Payer: Self-pay | Admitting: Cardiology

## 2023-11-27 NOTE — Progress Notes (Unsigned)
 Chase Console, Chase Combs [address] [place], [place]  [address] Phone: [phone]   Primary Care Physician: Wonda Worth [place], [place]  Primary Gastroenterologist:  Chase Console, Chase Combs / Glendia Holt, MD   Chief Complaint:  Hospital Followup Ileus, Diarrhea, Transaminitis       HPI:   Chase Combs is a 78 y.o. male presents for hospital follow-up of postoperative ileus, acute diarrhea, and transaminitis.  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  He was taking opioids for pain control and developed postoperative ileus.  Was hospitalized 10/09/2023 until 10/27/2023 at [facility] for postop ileus, hypoxia, fever, leukocytosis, hypokalemia, acute diarrhea, transaminitis.  C. difficile stool test negative.  GI pathogen panel negative.  Acute viral hepatitis A, B, C labs negative.  Acetaminophen  and salicylate levels negative.  He was treated with Zosyn  for aspiration pneumonia.  He had prolonged ileus and received TPN.  He had NG tube placed and removed.  Rectal tube placed and removed.  Was treated with Flagyl  for possible SIBO.  Diarrhea significantly Decreased.  He was treated with a total of 14 day course of Flagyl .  Advised against narcotic use.  He was seen initially by Dr. Holt and Greig Corti in hospital for GI consult.  10/17/2023 CT abdomen pelvis with contrast: 1. Diffuse colonic wall thickening which has developed since the prior study, consistent with inflammatory or infectious colitis. 2. Distended fluid-filled loops of small bowel with multiple gas fluid levels, most pronounced within the jejunum. There is no evidence of abrupt transition point to suggest obstruction, and findings could reflect sequela of ileus or diarrheal illness. 3. Interval development of multifocal right-sided airspace disease consistent with aspiration or infection. 4. Stable fat containing umbilical hernia. 5. Enlarged prostate. 6. Stable distal colonic  diverticulosis.  10/20/2023 flexible sigmoidoscopy by Dr. Legrand: Fair prep.  Left colon diverticulosis.  Congested erythematous mucosa in the entire examined colon (thought secondary to hypoalbuminemia).  Internal hemorrhoids.  Colon biopsies showed benign colon mucosa with no abnormality.  No evidence of IBD or microscopic colitis.  Current symptoms: He is currently feeling a lot better.  He has no more abdominal pain.  Currently having normal bowel movement 2 or 3 times per day.  Denies diarrhea, constipation, heartburn, dysphagia, melena, or hematochezia.  No current GI symptoms.  He is on vitamin B12.  Not taking iron.  He has never had a screening colonoscopy.  PMH: CAD s/p MI w/ coronary stent x 2 (in 2010).  History of prostate cancer (2022).  Takes low-dose aspirin  daily.  Prior to Admission medications   Medication Sig Start Date End Date Taking? Authorizing Provider  acetaminophen  (TYLENOL ) 500 MG tablet Take 500 mg by mouth every 6 (six) hours as needed.   Yes [provider]  amLODipine  (NORVASC ) 10 MG tablet TAKE 1 TABLET BY MOUTH EVERY DAY 05/04/23  Yes Anner Alm ORN, MD  aspirin  EC 81 MG tablet Take 81 mg by mouth 2 (two) times daily.   Yes [provider]  Cannabidiol  POWD Apply 1 Application topically 3 (three) times daily as needed (knee pain.). CBD cream   Yes [provider]  metoprolol  succinate (TOPROL -XL) 25 MG 24 hr tablet Take 0.5 tablets (12.5 mg total) by mouth daily. 06/21/23  Yes Anner Alm ORN, MD  Multiple Vitamin (MULTIVITAMIN WITH MINERALS) TABS tablet Take 1 tablet by mouth in the morning.   Yes [provider]  rosuvastatin  (CRESTOR ) 20 MG tablet Take 1  tablet (20 mg total) by mouth daily. Patient taking differently: Take 20 mg by mouth every evening. 06/21/23  Yes Anner Alm ORN, MD  tamsulosin  (FLOMAX ) 0.4 MG CAPS capsule Take 1 capsule (0.4 mg total) by mouth daily after supper. 11/17/21  Yes Patrcia Cough, MD   traMADol  (ULTRAM ) 50 MG tablet Take 1 tablet (50 mg total) by mouth every 6 (six) hours as needed for moderate pain (pain score 4-6) or severe pain (pain score 7-10). 10/27/23  Yes Pokhrel, Laxman, MD  valsartan  (DIOVAN ) 320 MG tablet TAKE 1 TABLET BY MOUTH EVERY DAY 01/14/23  Yes Anner Alm ORN, MD      Allergies as of 11/29/2023 - Review Complete 11/29/2023  Allergen Reaction Noted   Codeine Nausea And Vomiting 03/03/2013   Latex Rash 10/01/2023    Past Medical History:  Diagnosis Date   Abdominal hernia    per pt   Arthritis    hands   Benign localized prostatic hyperplasia with lower urinary tract symptoms (LUTS)    CAD (coronary artery disease) 05/2004   cardiologist--- dr anner;  positive stress test , had outpt cardiac cath 05-27-2004 stenosis RI;  06-18-2004 cath w/ PCI and BMS x1 to pRI;   STEMI 02-16-2009  s/p cath w/ PCI thrombectomy for total occluded RCA , BMS x1 to pRCA;  nuclear stress test 03-15-2012 low risk no ischemia but sig inferior-inferolateral infarct , ef 51%   Dyslipidemia    Essential hypertension    Heart murmur    History of COVID-19 05/2020   per pt mild symptoms that resolved   History of ST elevation myocardial infarction (STEMI) 02/16/2009   inferior STEMI   cath w/ pci w/ BMS to pRCA   History of urinary retention    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[facility]) 04/2021   urologist--- dr borden/ radiation oncology-- dr patrcia;  dx 12/ 2022,  Gleason 4+5, PSA 24.6, volume 94.6cc;  plan to start IMRT   OSA (obstructive sleep apnea)    per pt dx approx 2008, no cpap intolerant   PONV (postoperative nausea and vomiting)    Pre-diabetes    S/p bare metal coronary artery stent    01/ 2006  x1 BMS to pRI (CoStar study stent);  and 09/ 2010  x1 BMS to [facility]    Past Surgical History:  Procedure Laterality Date   CARDIAC CATHETERIZATION  05/27/2004   outpatient by dr burnard [facility] cardiology) for positive stress test;  80% stensis pRI   CORONARY  ANGIOPLASTY WITH STENT PLACEMENT  06/18/2004   @[facility]  by Dr Lavon;  PCI w/ BMS to pRI  (CoStar study stent - 2.5 x 16)   CORONARY ANGIOPLASTY WITH STENT PLACEMENT  02/16/2009   @[facility]  by dr verlin;   Inferior STEMI:  PCI w/ thrombectomy total occluded RCA, BMS x1 to pRCA (Vision BMS 3.5 x 23 -> 4.0 mm), residual 40-50% pLAD, ef 45-50%   FLEXIBLE SIGMOIDOSCOPY N/A 10/20/2023   Procedure: SIGMOIDOSCOPY, FLEXIBLE;  Surgeon: Legrand Victory LITTIE DOUGLAS, MD;  Location: [facility];  Service: Gastroenterology;  Laterality: N/A;   GOLD SEED IMPLANT N/A 09/19/2021   Procedure: GOLD SEED IMPLANT;  Surgeon: Cam Morene ORN, MD;  Location: [facility];  Service: Urology;  Laterality: N/A;  ONLY NEEDS 30 MIN FOR ALL   INGUINAL HERNIA REPAIR Right 09/02/1999   @[facility] ;   recurrent repair right inguinal hernia;   previous repair approx 1990s   INGUINAL HERNIA REPAIR Left    1990s   KNEE  ARTHROSCOPY W/ MENISCAL REPAIR Right 12/18/2005   @[facility]    LUMBAR DISC SURGERY  1982   L5--S1   SPACE OAR INSTILLATION N/A 09/19/2021   Procedure: SPACE OAR INSTILLATION;  Surgeon: Cam Morene ORN, MD;  Location[facility];  Service: Urology;  Laterality: N/A;   TOTAL KNEE ARTHROPLASTY Left 10/01/2023   Procedure: ARTHROPLASTY, KNEE, TOTAL;  Surgeon: Kay Kemps, MD;  Location: [facility];  Service: Orthopedics;  Laterality: Left;    Review of Systems:    All systems reviewed and negative except where noted in HPI.    Physical Exam:  BP 138/62   Pulse 88   Ht 6' (1.829 m)   Wt 211 lb (95.7 kg)   BMI 28.62 kg/m  No LMP for male patient.  General: Well-nourished, well-developed in no acute distress.  Lungs: Clear to auscultation bilaterally. Non-labored. Heart: Regular rate and rhythm, no murmurs rubs or gallops.  Abdomen: Bowel sounds are normal; Abdomen is Soft; No hepatosplenomegaly, masses or hernias;  No Abdominal Tenderness; No guarding or rebound tenderness. Neuro: Alert and oriented x  3.  Grossly intact.  Psych: Alert and cooperative, normal mood and affect.   Imaging Studies: DG Abd 1 View Result Date: 11/29/2023 CLINICAL DATA:  Follow up SBO. EXAM: ABDOMEN - 1 VIEW COMPARISON:  10/16/2023. FINDINGS: The bowel gas pattern is normal. No radio-opaque calculi or other significant radiographic abnormality are seen. IMPRESSION: Negative. Electronically Signed   By: Fonda Field M.D.   On: 11/29/2023 10:35    Labs: CBC    Component Value Date/Time   WBC 6.8 11/29/2023 0927   RBC 4.08 (L) 11/29/2023 0927   HGB 12.2 (L) 11/29/2023 0927   HGB 15.3 03/01/2020 0931   HCT 36.8 (L) 11/29/2023 0927   HCT 45.5 03/01/2020 0931   PLT 357.0 11/29/2023 0927   PLT 282 03/01/2020 0931   MCV 90.4 11/29/2023 0927   MCV 93 03/01/2020 0931   MCH 30.3 10/27/2023 0338   MCHC 33.1 11/29/2023 0927   RDW 16.7 (H) 11/29/2023 0927   RDW 12.7 03/01/2020 0931   LYMPHSABS 1.9 11/29/2023 0927   MONOABS 0.6 11/29/2023 0927   EOSABS 0.1 11/29/2023 0927   BASOSABS 0.0 11/29/2023 0927    CMP     Component Value Date/Time   NA 139 11/29/2023 0927   NA 142 06/06/2021 1010   K 3.8 11/29/2023 0927   CL 104 11/29/2023 0927   CO2 27 11/29/2023 0927   GLUCOSE 133 (H) 11/29/2023 0927   BUN 14 11/29/2023 0927   BUN 16 06/06/2021 1010   CREATININE 0.58 11/29/2023 0927   CREATININE 0.65 06/28/2014 0824   CALCIUM  9.3 11/29/2023 0927   PROT 7.0 11/29/2023 0927   PROT 6.8 06/06/2021 1010   ALBUMIN 4.0 11/29/2023 0927   ALBUMIN 4.6 06/06/2021 1010   AST 22 11/29/2023 0927   ALT 33 11/29/2023 0927   ALKPHOS 77 11/29/2023 0927   BILITOT 0.4 11/29/2023 0927   BILITOT 0.5 06/06/2021 1010   GFRNONAA >60 10/27/2023 0338   GFRAA 105 03/01/2020 0931    Assessment and Plan:   Chase Combs is a 78 y.o. y/o male presents for hospital follow-up of:  1.  Opioid-induced ileus versus small bowel obstruction s/p left total knee replacement 10/01/2023.  Appears to be resolved.  Currently  asymptomatic. - Repeat Abdominal x-ray for follow-up. - Avoid opiates  2.  Acute colitis: Most likely infectious - Resolved s/p treatment with Flagyl .  Flexible sigmoidoscopy in hospital showed  biopsies negative for IBD or microscopic colitis.  GI Pathogen Panal and C. Diff stool tests were negative.  He has no more diarrhea at present.                      3.  Anemia: Suspect Acute Blood Loss Anemia from recent knee replacement surgery.  History of B12 Deficiency. - Labs: CBC, iron panel, ferritin, B12, folate. - He is currently taking B12.  Not on Iron.  4.  Elevated LFTs: Transaminases and alkaline phosphatase: Likely due to recent knee replacement surgery (shock liver?) versus opioid pain medication. - Labs: CMP - If LFTs remain elevated, then I will order more liver labs. - If LFTs have returned to normal, then no further workup needed.  5.  Hypoalbuminemia - Lab: CMP  6.  Colon Cancer Screening - I discussed colon cancer screening options with patient at length.  Risks and benefits of Cologuard versus traditional colonoscopy were discussed.  Patient would like to proceed with screening Cologuard test.  Patient declined traditional colonoscopy.  Patient is made aware If Cologuard is positive, then we will need to schedule a diagnostic colonoscopy.  Patient expressed understanding. - Ordering screening Cologuard - Patient refused Colonoscopy  Chase Console, Chase Combs  Follow up based on above test results and GI symptoms.

## 2023-11-29 ENCOUNTER — Ambulatory Visit (INDEPENDENT_AMBULATORY_CARE_PROVIDER_SITE_OTHER)
Admission: RE | Admit: 2023-11-29 | Discharge: 2023-11-29 | Disposition: A | Discharge status: Home / Self Care | Source: Ambulatory Visit | Attending: Physician Assistant | Admitting: Physician Assistant

## 2023-11-29 ENCOUNTER — Other Ambulatory Visit (INDEPENDENT_AMBULATORY_CARE_PROVIDER_SITE_OTHER)

## 2023-11-29 ENCOUNTER — Ambulatory Visit: Payer: Self-pay | Admitting: Physician Assistant

## 2023-11-29 ENCOUNTER — Ambulatory Visit: Admitting: Physician Assistant

## 2023-11-29 ENCOUNTER — Encounter: Payer: Self-pay | Admitting: Physician Assistant

## 2023-11-29 VITALS — BP 138/62 | HR 88 | Ht 72.0 in | Wt 211.0 lb

## 2023-11-29 DIAGNOSIS — E119 Type 2 diabetes mellitus without complications: Secondary | ICD-10-CM | POA: Diagnosis not present

## 2023-11-29 DIAGNOSIS — R7989 Other specified abnormal findings of blood chemistry: Secondary | ICD-10-CM | POA: Diagnosis not present

## 2023-11-29 DIAGNOSIS — N179 Acute kidney failure, unspecified: Secondary | ICD-10-CM | POA: Diagnosis not present

## 2023-11-29 DIAGNOSIS — Z7982 Long term (current) use of aspirin: Secondary | ICD-10-CM | POA: Diagnosis not present

## 2023-11-29 DIAGNOSIS — E8809 Other disorders of plasma-protein metabolism, not elsewhere classified: Secondary | ICD-10-CM | POA: Diagnosis not present

## 2023-11-29 DIAGNOSIS — E785 Hyperlipidemia, unspecified: Secondary | ICD-10-CM | POA: Diagnosis not present

## 2023-11-29 DIAGNOSIS — M19041 Primary osteoarthritis, right hand: Secondary | ICD-10-CM | POA: Diagnosis not present

## 2023-11-29 DIAGNOSIS — D649 Anemia, unspecified: Secondary | ICD-10-CM

## 2023-11-29 DIAGNOSIS — R7303 Prediabetes: Secondary | ICD-10-CM | POA: Diagnosis not present

## 2023-11-29 DIAGNOSIS — R197 Diarrhea, unspecified: Secondary | ICD-10-CM

## 2023-11-29 DIAGNOSIS — M19042 Primary osteoarthritis, left hand: Secondary | ICD-10-CM | POA: Diagnosis not present

## 2023-11-29 DIAGNOSIS — K567 Ileus, unspecified: Secondary | ICD-10-CM

## 2023-11-29 DIAGNOSIS — Z79891 Long term (current) use of opiate analgesic: Secondary | ICD-10-CM | POA: Diagnosis not present

## 2023-11-29 DIAGNOSIS — I493 Ventricular premature depolarization: Secondary | ICD-10-CM | POA: Diagnosis not present

## 2023-11-29 DIAGNOSIS — Z8719 Personal history of other diseases of the digestive system: Secondary | ICD-10-CM

## 2023-11-29 DIAGNOSIS — K56609 Unspecified intestinal obstruction, unspecified as to partial versus complete obstruction: Secondary | ICD-10-CM | POA: Diagnosis not present

## 2023-11-29 DIAGNOSIS — I252 Old myocardial infarction: Secondary | ICD-10-CM | POA: Diagnosis not present

## 2023-11-29 DIAGNOSIS — R338 Other retention of urine: Secondary | ICD-10-CM | POA: Diagnosis not present

## 2023-11-29 DIAGNOSIS — Z96652 Presence of left artificial knee joint: Secondary | ICD-10-CM | POA: Diagnosis not present

## 2023-11-29 DIAGNOSIS — I2510 Atherosclerotic heart disease of native coronary artery without angina pectoris: Secondary | ICD-10-CM | POA: Diagnosis not present

## 2023-11-29 DIAGNOSIS — Z9181 History of falling: Secondary | ICD-10-CM | POA: Diagnosis not present

## 2023-11-29 DIAGNOSIS — D509 Iron deficiency anemia, unspecified: Secondary | ICD-10-CM

## 2023-11-29 DIAGNOSIS — I10 Essential (primary) hypertension: Secondary | ICD-10-CM | POA: Diagnosis not present

## 2023-11-29 DIAGNOSIS — Z471 Aftercare following joint replacement surgery: Secondary | ICD-10-CM | POA: Diagnosis not present

## 2023-11-29 DIAGNOSIS — G4733 Obstructive sleep apnea (adult) (pediatric): Secondary | ICD-10-CM | POA: Diagnosis not present

## 2023-11-29 DIAGNOSIS — Z1211 Encounter for screening for malignant neoplasm of colon: Secondary | ICD-10-CM

## 2023-11-29 DIAGNOSIS — Z955 Presence of coronary angioplasty implant and graft: Secondary | ICD-10-CM | POA: Diagnosis not present

## 2023-11-29 LAB — CBC WITH DIFFERENTIAL/PLATELET
Basophils Absolute: 0 K/uL (ref 0.0–0.1)
Basophils Relative: 0.3 % (ref 0.0–3.0)
Eosinophils Absolute: 0.1 K/uL (ref 0.0–0.7)
Eosinophils Relative: 2 % (ref 0.0–5.0)
HCT: 36.8 % — ABNORMAL LOW (ref 39.0–52.0)
Hemoglobin: 12.2 g/dL — ABNORMAL LOW (ref 13.0–17.0)
Lymphocytes Relative: 27.5 % (ref 12.0–46.0)
Lymphs Abs: 1.9 K/uL (ref 0.7–4.0)
MCHC: 33.1 g/dL (ref 30.0–36.0)
MCV: 90.4 fl (ref 78.0–100.0)
Monocytes Absolute: 0.6 K/uL (ref 0.1–1.0)
Monocytes Relative: 9.5 % (ref 3.0–12.0)
Neutro Abs: 4.1 K/uL (ref 1.4–7.7)
Neutrophils Relative %: 60.7 % (ref 43.0–77.0)
Platelets: 357 K/uL (ref 150.0–400.0)
RBC: 4.08 Mil/uL — ABNORMAL LOW (ref 4.22–5.81)
RDW: 16.7 % — ABNORMAL HIGH (ref 11.5–15.5)
WBC: 6.8 K/uL (ref 4.0–10.5)

## 2023-11-29 LAB — COMPREHENSIVE METABOLIC PANEL WITH GFR
ALT: 33 U/L (ref 0–53)
AST: 22 U/L (ref 0–37)
Albumin: 4 g/dL (ref 3.5–5.2)
Alkaline Phosphatase: 77 U/L (ref 39–117)
BUN: 14 mg/dL (ref 6–23)
CO2: 27 meq/L (ref 19–32)
Calcium: 9.3 mg/dL (ref 8.4–10.5)
Chloride: 104 meq/L (ref 96–112)
Creatinine, Ser: 0.58 mg/dL (ref 0.40–1.50)
GFR: 95 mL/min (ref >60.00)
Glucose, Bld: 133 mg/dL — ABNORMAL HIGH (ref 70–99)
Potassium: 3.8 meq/L (ref 3.5–5.1)
Sodium: 139 meq/L (ref 135–145)
Total Bilirubin: 0.4 mg/dL (ref 0.2–1.2)
Total Protein: 7 g/dL (ref 6.0–8.3)

## 2023-11-29 LAB — B12 AND FOLATE PANEL
Folate: 16.5 ng/mL (ref >5.9)
Vitamin B-12: 727 pg/mL (ref 211–911)

## 2023-11-29 NOTE — Patient Instructions (Addendum)
 Your provider has requested that you go to the basement level for lab work before leaving today. Press B on the elevator. The lab is located at the first door on the left as you exit the elevator.  Please go to the X-ray department.  Your provider has ordered Cologuard testing as an option for colon cancer screening. This is performed by [employer] and may be out of network with your insurance. PRIOR to completing the test, it is YOUR responsibility to contact your insurance about covered benefits for this test. Your out of pocket expense could be anywhere from $0.00 to $649.00.   When you call to check coverage with your insurer, please provide the following information:   -The ONLY provider of Cologuard is Opt[facility], dispensing  - CPT code for Cologuard is [phone].  [employer] NPI # [id number]  -[employer] Tax ID # [id number]   We have already sent your demographic and insurance information to [employer] (phone number [phone]) and they should contact you within the next week regarding your test. If you have not heard from them within the next week, please call our office at [phone].  Please follow up sooner if symptoms increase or worsen  Thank you for trusting me with your gastrointestinal care!   Ellouise Console, PA-C  Due to recent changes in healthcare laws, you may see the results of your imaging and laboratory studies on MyChart before your provider has had a chance to review them.  We understand that in some cases there may be results that are confusing or concerning to you. Not all laboratory results come back in the same time frame and the provider may be waiting for multiple results in order to interpret others.  Please give us  48 hours in order for your provider to thoroughly review all the results before contacting the office for clarification of your results.  _______________________________________________________  If  your blood pressure at your visit was 140/90 or greater, please contact your primary care physician to follow up on this.  _______________________________________________________  If you are age 26 or older, your body mass index should be between 23-30. Your Body mass index is 28.62 kg/m. If this is out of the aforementioned range listed, please consider follow up with your Primary Care Provider.  If you are age 49 or younger, your body mass index should be between 19-25. Your Body mass index is 28.62 kg/m. If this is out of the aformentioned range listed, please consider follow up with your Primary Care Provider.   ________________________________________________________  The [facility] providers would like to encourage you to use MYCHART to communicate with providers for non-urgent requests or questions.  Due to long hold times on the telephone, sending your provider a message by [facility] may be a faster and more efficient way to get a response.  Please allow 48 business hours for a response.  Please remember that this is for non-urgent requests.  _______________________________________________________

## 2023-11-30 LAB — IRON,TIBC AND FERRITIN PANEL
%SAT: 15 % — ABNORMAL LOW (ref 20–48)
Ferritin: 92 ng/mL (ref 24–380)
Iron: 48 ug/dL — ABNORMAL LOW (ref 50–180)
TIBC: 317 ug/dL (ref 250–425)

## 2023-12-01 NOTE — Progress Notes (Signed)
 Agree with the assessment and plan as outlined by Brigitte Canard, PA-C.

## 2023-12-06 DIAGNOSIS — D649 Anemia, unspecified: Secondary | ICD-10-CM | POA: Diagnosis not present

## 2023-12-06 DIAGNOSIS — R748 Abnormal levels of other serum enzymes: Secondary | ICD-10-CM | POA: Diagnosis not present

## 2023-12-16 ENCOUNTER — Telehealth: Payer: Self-pay | Admitting: Physician Assistant

## 2023-12-16 ENCOUNTER — Other Ambulatory Visit: Payer: Self-pay

## 2023-12-16 DIAGNOSIS — Z1211 Encounter for screening for malignant neoplasm of colon: Secondary | ICD-10-CM

## 2023-12-16 DIAGNOSIS — I25119 Atherosclerotic heart disease of native coronary artery with unspecified angina pectoris: Secondary | ICD-10-CM | POA: Diagnosis not present

## 2023-12-16 DIAGNOSIS — R600 Localized edema: Secondary | ICD-10-CM | POA: Diagnosis not present

## 2023-12-16 DIAGNOSIS — R0602 Shortness of breath: Secondary | ICD-10-CM | POA: Diagnosis not present

## 2023-12-16 DIAGNOSIS — E7800 Pure hypercholesterolemia, unspecified: Secondary | ICD-10-CM | POA: Diagnosis not present

## 2023-12-16 DIAGNOSIS — Z9861 Coronary angioplasty status: Secondary | ICD-10-CM | POA: Diagnosis not present

## 2023-12-16 DIAGNOSIS — Z0000 Encounter for general adult medical examination without abnormal findings: Secondary | ICD-10-CM | POA: Diagnosis not present

## 2023-12-16 DIAGNOSIS — I10 Essential (primary) hypertension: Secondary | ICD-10-CM | POA: Diagnosis not present

## 2023-12-16 DIAGNOSIS — E119 Type 2 diabetes mellitus without complications: Secondary | ICD-10-CM | POA: Diagnosis not present

## 2023-12-16 NOTE — Telephone Encounter (Signed)
 COLOGUARD order was in for future. Order fixed and pt knows he should now receive the kit in the mail.

## 2023-12-16 NOTE — Telephone Encounter (Signed)
 Patient called and stated that he has not received his colo guard kit and is requesting that we give him a call back .Please advise.

## 2023-12-22 DIAGNOSIS — E119 Type 2 diabetes mellitus without complications: Secondary | ICD-10-CM | POA: Diagnosis not present

## 2023-12-22 DIAGNOSIS — I10 Essential (primary) hypertension: Secondary | ICD-10-CM | POA: Diagnosis not present

## 2023-12-22 DIAGNOSIS — E7800 Pure hypercholesterolemia, unspecified: Secondary | ICD-10-CM | POA: Diagnosis not present

## 2023-12-28 DIAGNOSIS — Z1211 Encounter for screening for malignant neoplasm of colon: Secondary | ICD-10-CM | POA: Diagnosis not present

## 2023-12-28 NOTE — Telephone Encounter (Signed)
 Contacted patient to remind him he is due for repeat labs. Patient states he will come after returning from vacation on Monday, 01/10/24.

## 2023-12-29 DIAGNOSIS — R310 Gross hematuria: Secondary | ICD-10-CM | POA: Diagnosis not present

## 2023-12-29 DIAGNOSIS — D49512 Neoplasm of unspecified behavior of left kidney: Secondary | ICD-10-CM | POA: Diagnosis not present

## 2023-12-30 ENCOUNTER — Telehealth: Payer: Self-pay | Admitting: Cardiology

## 2023-12-30 NOTE — Telephone Encounter (Signed)
 Pt c/o of Chest Pain: STAT if active (IN THIS MOMENT) CP, including tightness, pressure, jaw pain, shoulder/upper arm/back pain, SOB, nausea, and vomiting.  1. Are you having CP right now (tightness, pressure, or discomfort)? No, but last felt it yesterday afternoon   2. Are you experiencing any other symptoms (ex. SOB, nausea, vomiting, sweating)? SOB when it happens but since yesterday and pt can't sleep at night  3. How long have you been experiencing CP? 1 week   4. Is your CP continuous or coming and going? Coming and going   5. Have you taken Nitroglycerin ? No   6. If CP returns before callback, please consider calling 911. ?

## 2023-12-30 NOTE — Telephone Encounter (Signed)
 Patient identification verified by 2 forms.   Called and spoke to patient  Patient states:  -When walking hard and fast the chest pain and SOB is worse -It's like lung pain.  -Productive cough - not an all the time thing  -He can call me if he wants  Patient denies:  -Swelling from his heart (below knee from knee issues)  Interventions/Plan: -Recommended to see PCP.   Reviewed ED warning signs/precautions  Patient agrees with plan, no questions at this time

## 2023-12-31 DIAGNOSIS — J209 Acute bronchitis, unspecified: Secondary | ICD-10-CM | POA: Diagnosis not present

## 2024-01-04 LAB — COLOGUARD: COLOGUARD: NEGATIVE

## 2024-01-05 ENCOUNTER — Ambulatory Visit: Payer: Self-pay | Admitting: Physician Assistant

## 2024-01-05 ENCOUNTER — Other Ambulatory Visit: Payer: Self-pay | Admitting: Cardiology

## 2024-01-06 ENCOUNTER — Telehealth: Payer: Self-pay | Admitting: Cardiology

## 2024-01-06 NOTE — Telephone Encounter (Signed)
 Pt returning nurse's call. Please advise

## 2024-01-06 NOTE — Telephone Encounter (Signed)
 Pt c/o Shortness Of Breath: STAT if SOB developed within the last 24 hours or pt is noticeably SOB on the phone  1. Are you currently SOB (can you hear that pt is SOB on the phone)? no  2. How long have you been experiencing SOB? 1 week  3. Are you SOB when sitting or when up moving around? both  4. Are you currently experiencing any other symptoms? Pt is having anxiety. Also wants to know if he can use a primatene  Inhaler with his heart conditions

## 2024-01-06 NOTE — Telephone Encounter (Signed)
 Primatene  mist is used for relief of asthma symptoms. It contains epinephrine  which could increase BP and heart rate. Do not recommend due to history of MI. Recommend he see PCP if he believes he has bronchitis

## 2024-01-06 NOTE — Telephone Encounter (Signed)
 Called patient's wife (DPR) about message. She stated patient is having [place], but currently not having SOB. Patient got a Primatene  Inhaler, and stated it is like a mist to help with his bronchitis. Informed them that Dr. Anner is out of the office the next two days, and we could ask our pharmacy about it. Also asked if he has talked to his PCP. Then call was dropped. Tried to call back, no answer, left voicemail. Will send to PharmD for advisement.

## 2024-01-06 NOTE — Telephone Encounter (Signed)
 Called patient with the PharmD's advisement. Patient verbalized understanding and would like to see Dr. Anner as soon as possible. Made patient an appointment with Dr. Anner, first available.

## 2024-01-07 ENCOUNTER — Other Ambulatory Visit: Payer: Self-pay

## 2024-01-07 ENCOUNTER — Inpatient Hospital Stay (HOSPITAL_BASED_OUTPATIENT_CLINIC_OR_DEPARTMENT_OTHER)
Admission: EM | Admit: 2024-01-07 | Discharge: 2024-01-19 | DRG: 853 | Disposition: A | Discharge status: Home / Self Care | Attending: Internal Medicine | Admitting: Internal Medicine

## 2024-01-07 ENCOUNTER — Emergency Department (HOSPITAL_BASED_OUTPATIENT_CLINIC_OR_DEPARTMENT_OTHER)

## 2024-01-07 ENCOUNTER — Encounter (HOSPITAL_BASED_OUTPATIENT_CLINIC_OR_DEPARTMENT_OTHER): Payer: Self-pay

## 2024-01-07 DIAGNOSIS — I472 Ventricular tachycardia, unspecified: Secondary | ICD-10-CM | POA: Diagnosis not present

## 2024-01-07 DIAGNOSIS — M19042 Primary osteoarthritis, left hand: Secondary | ICD-10-CM | POA: Diagnosis not present

## 2024-01-07 DIAGNOSIS — E860 Dehydration: Secondary | ICD-10-CM | POA: Diagnosis present

## 2024-01-07 DIAGNOSIS — J9601 Acute respiratory failure with hypoxia: Secondary | ICD-10-CM | POA: Diagnosis not present

## 2024-01-07 DIAGNOSIS — E663 Overweight: Secondary | ICD-10-CM | POA: Diagnosis present

## 2024-01-07 DIAGNOSIS — E876 Hypokalemia: Secondary | ICD-10-CM | POA: Diagnosis not present

## 2024-01-07 DIAGNOSIS — E8809 Other disorders of plasma-protein metabolism, not elsewhere classified: Secondary | ICD-10-CM | POA: Diagnosis not present

## 2024-01-07 DIAGNOSIS — I2489 Other forms of acute ischemic heart disease: Secondary | ICD-10-CM | POA: Diagnosis not present

## 2024-01-07 DIAGNOSIS — I5023 Acute on chronic systolic (congestive) heart failure: Secondary | ICD-10-CM

## 2024-01-07 DIAGNOSIS — I700 Atherosclerosis of aorta: Secondary | ICD-10-CM | POA: Diagnosis not present

## 2024-01-07 DIAGNOSIS — Z8546 Personal history of malignant neoplasm of prostate: Secondary | ICD-10-CM

## 2024-01-07 DIAGNOSIS — E785 Hyperlipidemia, unspecified: Secondary | ICD-10-CM | POA: Diagnosis present

## 2024-01-07 DIAGNOSIS — I255 Ischemic cardiomyopathy: Secondary | ICD-10-CM | POA: Diagnosis present

## 2024-01-07 DIAGNOSIS — I5022 Chronic systolic (congestive) heart failure: Secondary | ICD-10-CM

## 2024-01-07 DIAGNOSIS — I5043 Acute on chronic combined systolic (congestive) and diastolic (congestive) heart failure: Secondary | ICD-10-CM | POA: Diagnosis not present

## 2024-01-07 DIAGNOSIS — J189 Pneumonia, unspecified organism: Secondary | ICD-10-CM | POA: Diagnosis not present

## 2024-01-07 DIAGNOSIS — I272 Pulmonary hypertension, unspecified: Secondary | ICD-10-CM | POA: Diagnosis not present

## 2024-01-07 DIAGNOSIS — I110 Hypertensive heart disease with heart failure: Secondary | ICD-10-CM | POA: Diagnosis not present

## 2024-01-07 DIAGNOSIS — R000 Tachycardia, unspecified: Secondary | ICD-10-CM | POA: Diagnosis not present

## 2024-01-07 DIAGNOSIS — I252 Old myocardial infarction: Secondary | ICD-10-CM

## 2024-01-07 DIAGNOSIS — A419 Sepsis, unspecified organism: Secondary | ICD-10-CM | POA: Diagnosis not present

## 2024-01-07 DIAGNOSIS — J123 Human metapneumovirus pneumonia: Secondary | ICD-10-CM | POA: Diagnosis present

## 2024-01-07 DIAGNOSIS — J90 Pleural effusion, not elsewhere classified: Secondary | ICD-10-CM | POA: Diagnosis not present

## 2024-01-07 DIAGNOSIS — I340 Nonrheumatic mitral (valve) insufficiency: Secondary | ICD-10-CM | POA: Diagnosis present

## 2024-01-07 DIAGNOSIS — Z96652 Presence of left artificial knee joint: Secondary | ICD-10-CM | POA: Diagnosis present

## 2024-01-07 DIAGNOSIS — R079 Chest pain, unspecified: Secondary | ICD-10-CM

## 2024-01-07 DIAGNOSIS — D638 Anemia in other chronic diseases classified elsewhere: Secondary | ICD-10-CM | POA: Diagnosis present

## 2024-01-07 DIAGNOSIS — R6520 Severe sepsis without septic shock: Secondary | ICD-10-CM | POA: Diagnosis present

## 2024-01-07 DIAGNOSIS — M19041 Primary osteoarthritis, right hand: Secondary | ICD-10-CM | POA: Diagnosis present

## 2024-01-07 DIAGNOSIS — Z885 Allergy status to narcotic agent status: Secondary | ICD-10-CM

## 2024-01-07 DIAGNOSIS — E871 Hypo-osmolality and hyponatremia: Secondary | ICD-10-CM | POA: Diagnosis not present

## 2024-01-07 DIAGNOSIS — I2510 Atherosclerotic heart disease of native coronary artery without angina pectoris: Secondary | ICD-10-CM

## 2024-01-07 DIAGNOSIS — Z79899 Other long term (current) drug therapy: Secondary | ICD-10-CM

## 2024-01-07 DIAGNOSIS — Z91040 Latex allergy status: Secondary | ICD-10-CM

## 2024-01-07 DIAGNOSIS — I509 Heart failure, unspecified: Secondary | ICD-10-CM

## 2024-01-07 DIAGNOSIS — R0602 Shortness of breath: Secondary | ICD-10-CM | POA: Diagnosis not present

## 2024-01-07 DIAGNOSIS — R042 Hemoptysis: Secondary | ICD-10-CM | POA: Diagnosis present

## 2024-01-07 DIAGNOSIS — Z7982 Long term (current) use of aspirin: Secondary | ICD-10-CM

## 2024-01-07 DIAGNOSIS — G4733 Obstructive sleep apnea (adult) (pediatric): Secondary | ICD-10-CM | POA: Diagnosis present

## 2024-01-07 DIAGNOSIS — Z955 Presence of coronary angioplasty implant and graft: Secondary | ICD-10-CM

## 2024-01-07 DIAGNOSIS — R918 Other nonspecific abnormal finding of lung field: Secondary | ICD-10-CM | POA: Diagnosis not present

## 2024-01-07 DIAGNOSIS — Z1152 Encounter for screening for COVID-19: Secondary | ICD-10-CM

## 2024-01-07 DIAGNOSIS — R7401 Elevation of levels of liver transaminase levels: Secondary | ICD-10-CM | POA: Diagnosis present

## 2024-01-07 DIAGNOSIS — Z713 Dietary counseling and surveillance: Secondary | ICD-10-CM

## 2024-01-07 DIAGNOSIS — Z6829 Body mass index (BMI) 29.0-29.9, adult: Secondary | ICD-10-CM

## 2024-01-07 DIAGNOSIS — Z79891 Long term (current) use of opiate analgesic: Secondary | ICD-10-CM

## 2024-01-07 LAB — TROPONIN T, HIGH SENSITIVITY: Troponin T High Sensitivity: 51 ng/L — ABNORMAL HIGH (ref 0–19)

## 2024-01-07 LAB — BASIC METABOLIC PANEL WITH GFR
Anion gap: 12 (ref 5–15)
BUN: 21 mg/dL (ref 8–23)
CO2: 25 mmol/L (ref 22–32)
Calcium: 8.8 mg/dL — ABNORMAL LOW (ref 8.9–10.3)
Chloride: 101 mmol/L (ref 98–111)
Creatinine, Ser: 0.76 mg/dL (ref 0.61–1.24)
GFR, Estimated: >60 mL/min (ref >60)
Glucose, Bld: 122 mg/dL — ABNORMAL HIGH (ref 70–99)
Potassium: 3.6 mmol/L (ref 3.5–5.1)
Sodium: 139 mmol/L (ref 135–145)

## 2024-01-07 LAB — I-STAT ARTERIAL BLOOD GAS, ED
Acid-Base Excess: 2 mmol/L (ref 0.0–2.0)
Bicarbonate: 26.9 mmol/L (ref 20.0–28.0)
Calcium, Ion: 1.2 mmol/L (ref 1.15–1.40)
HCT: 34 % — ABNORMAL LOW (ref 39.0–52.0)
Hemoglobin: 11.6 g/dL — ABNORMAL LOW (ref 13.0–17.0)
O2 Saturation: 95 %
Patient temperature: 98.4
Potassium: 3.4 mmol/L — ABNORMAL LOW (ref 3.5–5.1)
Sodium: 139 mmol/L (ref 135–145)
TCO2: 28 mmol/L (ref 22–32)
pCO2 arterial: 40.2 mmHg (ref 32–48)
pH, Arterial: 7.434 (ref 7.35–7.45)
pO2, Arterial: 71 mmHg — ABNORMAL LOW (ref 83–108)

## 2024-01-07 LAB — CBC
HCT: 34.6 % — ABNORMAL LOW (ref 39.0–52.0)
Hemoglobin: 11 g/dL — ABNORMAL LOW (ref 13.0–17.0)
MCH: 29.3 pg (ref 26.0–34.0)
MCHC: 31.8 g/dL (ref 30.0–36.0)
MCV: 92 fL (ref 80.0–100.0)
Platelets: 320 K/uL (ref 150–400)
RBC: 3.76 MIL/uL — ABNORMAL LOW (ref 4.22–5.81)
RDW: 16.6 % — ABNORMAL HIGH (ref 11.5–15.5)
WBC: 6.2 K/uL (ref 4.0–10.5)
nRBC: 0 % (ref 0.0–0.2)

## 2024-01-07 LAB — PRO BRAIN NATRIURETIC PEPTIDE: Pro Brain Natriuretic Peptide: 15245 pg/mL — ABNORMAL HIGH (ref <300.0)

## 2024-01-07 NOTE — ED Provider Notes (Signed)
 [place] EMERGENCY DEPARTMENT AT [facility] Provider Note   CSN: [id number] Arrival date & time: 01/07/24  2143     Patient presents with: Shortness of Breath and Chest Pain   Chase Combs is a 78 y.o. male.   Patient presents to the emergency department for evaluation of shortness of breath.  Patient reports that symptoms have progressively worsened over a period of 5 or 6 days.  He was told that he has bronchitis by his doctor.  He reports that he has not been able to sleep because his breathing worsens when he lies flat.  He does have swelling of his legs but reports that his legs have been swollen since he had knee surgery in May.       Prior to Admission medications   Medication Sig Start Date End Date Taking? Authorizing Provider  acetaminophen  (TYLENOL ) 500 MG tablet Take 500 mg by mouth every 6 (six) hours as needed.    [provider]  amLODipine  (NORVASC ) 10 MG tablet TAKE 1 TABLET BY MOUTH EVERY DAY 01/07/24   Anner Alm ORN, MD  aspirin  EC 81 MG tablet Take 81 mg by mouth 2 (two) times daily.    [provider]  Cannabidiol  POWD Apply 1 Application topically 3 (three) times daily as needed (knee pain.). CBD cream    [provider]  cyanocobalamin  (VITAMIN B12) 500 MCG tablet Take 500 mcg by mouth daily.    [provider]  metoprolol  succinate (TOPROL -XL) 25 MG 24 hr tablet Take 0.5 tablets (12.5 mg total) by mouth daily. 06/21/23   Anner Alm ORN, MD  Multiple Vitamin (MULTIVITAMIN WITH MINERALS) TABS tablet Take 1 tablet by mouth in the morning.    [provider]  rosuvastatin  (CRESTOR ) 20 MG tablet TAKE 1 TABLET(20 MG) BY MOUTH DAILY 11/29/23   Anner Alm ORN, MD  tamsulosin  (FLOMAX ) 0.4 MG CAPS capsule Take 1 capsule (0.4 mg total) by mouth daily after supper. 11/17/21   Patrcia Cough, MD  traMADol  (ULTRAM ) 50 MG tablet Take 1 tablet (50 mg total) by mouth every 6 (six) hours as needed for moderate pain  (pain score 4-6) or severe pain (pain score 7-10). 10/27/23   Pokhrel, Vernal, MD  valsartan  (DIOVAN ) 320 MG tablet TAKE 1 TABLET BY MOUTH EVERY DAY 01/14/23   Anner Alm ORN, MD    Allergies: Codeine and Latex    Review of Systems  Updated Vital Signs BP (!) 146/112   Pulse (!) 39   Temp 98.4 F (36.9 C) (Oral)   Resp (!) 34   Ht 6' (1.829 m)   Wt 96.2 kg   SpO2 93%   BMI 28.75 kg/m   Physical Exam Vitals and nursing note reviewed.  Constitutional:      General: He is not in acute distress.    Appearance: He is well-developed.  HENT:     Head: Normocephalic and atraumatic.     Mouth/Throat:     Mouth: Mucous membranes are moist.  Eyes:     General: Vision grossly intact. Gaze aligned appropriately.     Extraocular Movements: Extraocular movements intact.     Conjunctiva/sclera: Conjunctivae normal.  Cardiovascular:     Rate and Rhythm: Normal rate and regular rhythm.     Pulses: Normal pulses.     Heart sounds: Normal heart sounds, S1 normal and S2 normal. No murmur heard.    No friction rub. No gallop.  Pulmonary:     Effort: Pulmonary effort is  normal. Tachypnea and prolonged expiration present. No respiratory distress.     Breath sounds: Decreased breath sounds, rhonchi and rales present.  Abdominal:     Palpations: Abdomen is soft.     Tenderness: There is no abdominal tenderness. There is no guarding or rebound.     Hernia: No hernia is present.  Musculoskeletal:        General: No swelling.     Cervical back: Full passive range of motion without pain, normal range of motion and neck supple. No pain with movement, spinous process tenderness or muscular tenderness. Normal range of motion.     Right lower leg: 2+ Pitting Edema present.     Left lower leg: 2+ Pitting Edema present.  Skin:    General: Skin is warm and dry.     Capillary Refill: Capillary refill takes less than 2 seconds.     Findings: No ecchymosis, erythema, lesion or wound.  Neurological:      Mental Status: He is alert and oriented to person, place, and time.     GCS: GCS eye subscore is 4. GCS verbal subscore is 5. GCS motor subscore is 6.     Cranial Nerves: Cranial nerves 2-12 are intact.     Sensory: Sensation is intact.     Motor: Motor function is intact. No weakness or abnormal muscle tone.     Coordination: Coordination is intact.  Psychiatric:        Mood and Affect: Mood normal.        Speech: Speech normal.        Behavior: Behavior normal.     (all labs ordered are listed, but only abnormal results are displayed) Labs Reviewed  BASIC METABOLIC PANEL WITH GFR - Abnormal; Notable for the following components:      Result Value   Glucose, Bld 122 (*)    Calcium  8.8 (*)    All other components within normal limits  CBC - Abnormal; Notable for the following components:   RBC 3.76 (*)    Hemoglobin 11.0 (*)    HCT 34.6 (*)    RDW 16.6 (*)    All other components within normal limits  PRO BRAIN NATRIURETIC PEPTIDE - Abnormal; Notable for the following components:   Pro Brain Natriuretic Peptide 15,245.0 (*)    All other components within normal limits  I-STAT ARTERIAL BLOOD GAS, ED - Abnormal; Notable for the following components:   pO2, Arterial 71 (*)    Potassium 3.4 (*)    HCT 34.0 (*)    Hemoglobin 11.6 (*)    All other components within normal limits  TROPONIN T, HIGH SENSITIVITY - Abnormal; Notable for the following components:   Troponin T High Sensitivity 51 (*)    All other components within normal limits  CULTURE, BLOOD (ROUTINE X 2)  CULTURE, BLOOD (ROUTINE X 2)  BLOOD GAS, ARTERIAL  LACTIC ACID, PLASMA  TROPONIN T, HIGH SENSITIVITY    EKG: EKG Interpretation Date/Time:  Friday January 07 2024 21:54:28 EDT Ventricular Rate:  116 PR Interval:  182 QRS Duration:  114 QT Interval:  374 QTC Calculation: 519 R Axis:   143  Text Interpretation: Sinus tachycardia with Premature atrial complexes Left posterior fascicular block Possible  Anterior infarct , age undetermined Abnormal ECG When compared with ECG of 12-Oct-2023 16:58, Premature ventricular complexes are no longer Present Premature atrial complexes are now Present Left posterior fascicular block is now Present Right bundle branch block is no longer Present Borderline criteria  for Anterior infarct are now Present Confirmed by Zackowski, Scott [phone]) on 01/07/2024 9:59:55 PM  Radiology: CT Angio Chest Pulmonary Embolism (PE) W or WO Contrast Result Date: 01/08/2024 CLINICAL DATA:  Pulmonary embolism (PE) suspected, high prob. Shortness of breath EXAM: CT ANGIOGRAPHY CHEST WITH CONTRAST TECHNIQUE: Multidetector CT imaging of the chest was performed using the standard protocol during bolus administration of intravenous contrast. Multiplanar CT image reconstructions and MIPs were obtained to evaluate the vascular anatomy. RADIATION DOSE REDUCTION: This exam was performed according to the departmental dose-optimization program which includes automated exposure control, adjustment of the mA and/or kV according to patient size and/or use of iterative reconstruction technique. CONTRAST:  75mL OMNIPAQUE  IOHEXOL  350 MG/ML SOLN COMPARISON:  10/10/2023.  Chest x-ray today. FINDINGS: Cardiovascular: Cardiomegaly. Three-vessel coronary artery disease and moderate aortic atherosclerosis. No evidence of aortic aneurysm. Mediastinum/Nodes: No mediastinal, hilar, or axillary adenopathy. Trachea and esophagus are unremarkable. Lungs/Pleura: Patchy airspace disease throughout the right lung most compatible with pneumonia. Small left pleural effusion. Compressive atelectasis in the left lower lobe. Upper Abdomen: No acute findings Musculoskeletal: Chest wall soft tissues are unremarkable. No acute bony abnormality. Review of the MIP images confirms the above findings. IMPRESSION: Extensive patchy airspace disease throughout the right lung most compatible with pneumonia. Small left pleural effusion with  left lower lobe atelectasis. Cardiomegaly, diffuse 3 vessel coronary artery disease. No evidence of pulmonary embolus. Aortic Atherosclerosis (ICD10-I70.0). Electronically Signed   By: Franky Crease M.D.   On: 01/08/2024 00:53   DG Chest Port 1 View Result Date: 01/07/2024 CLINICAL DATA:  Shortness of breath. EXAM: PORTABLE CHEST 1 VIEW COMPARISON:  10/16/2023 FINDINGS: Stable cardiomediastinal silhouette. Patchy airspace opacities in the right lung. Central lucency in the opacities in the right upper lung. Left basilar atelectasis or pneumonia. Small bilateral pleural effusions. No pneumothorax. IMPRESSION: 1. Multifocal pneumonia in the right lung. Central lucency in the opacities in the right upper lung concerning for cavitation. CT is recommended for further evaluation. 2. Left basilar atelectasis or pneumonia. 3. Small bilateral pleural effusions. Electronically Signed   By: Norman Gatlin M.D.   On: 01/07/2024 23:28     Procedures   Medications Ordered in the ED  iohexol  (OMNIPAQUE ) 350 MG/ML injection 75 mL (75 mLs Intravenous Contrast Given 01/08/24 0027)                                    Medical Decision Making Amount and/or Complexity of Data Reviewed External Data Reviewed: radiology, ECG and notes. Labs: ordered. Decision-making details documented in ED Course. Radiology: ordered and independent interpretation performed. Decision-making details documented in ED Course. ECG/medicine tests: ordered and independent interpretation performed. Decision-making details documented in ED Course.  Risk Prescription drug management. Decision regarding hospitalization.   Differential Diagnosis considered includes, but not limited to: CHF exacerbation; ACS/MI; Bronchitis/URI; Pneumonia; PE      Final diagnoses:  Multifocal pneumonia    Presents to the emergency department for evaluation of shortness of breath.  He has been sick for nearly a week.  Patient does report cough and  chest congestion, was told that he had bronchitis.  He does report that he worsens at night when he tries to lie flat.  Examination reveals significant bilateral leg swelling.  He reports that his leg swelling is chronic.  Patient with new oxygen requirement.  He is not in any significant distress, however.  Blood gas does not show  any CO2 retention, adequate oxygenation on O2.  proBNP is elevated.  Chest x-ray, however, more consistent with multifocal pneumonia.  There was concern for cavitary lesion on plain film x-ray.  CT chest, however, does not show any evidence of cavitary lesion.  No PE.  Patient will be treated with broad-spectrum antibiotics, admitted to hospital.  ED Discharge Orders     None          Sharif Rendell, Lonni PARAS, MD 01/08/24 [phone]

## 2024-01-07 NOTE — ED Notes (Signed)
 Pt placed on 2lt Thermal due to low SATS

## 2024-01-07 NOTE — ED Triage Notes (Signed)
 Pt reports being dx with Bronchitis x1 week. Pt reports increased SOB when sitting back and reported nausea and anxiety.

## 2024-01-08 ENCOUNTER — Emergency Department (HOSPITAL_BASED_OUTPATIENT_CLINIC_OR_DEPARTMENT_OTHER)

## 2024-01-08 DIAGNOSIS — I4719 Other supraventricular tachycardia: Secondary | ICD-10-CM | POA: Diagnosis not present

## 2024-01-08 DIAGNOSIS — A419 Sepsis, unspecified organism: Secondary | ICD-10-CM | POA: Diagnosis not present

## 2024-01-08 DIAGNOSIS — I472 Ventricular tachycardia, unspecified: Secondary | ICD-10-CM | POA: Diagnosis not present

## 2024-01-08 DIAGNOSIS — I25118 Atherosclerotic heart disease of native coronary artery with other forms of angina pectoris: Secondary | ICD-10-CM | POA: Diagnosis not present

## 2024-01-08 DIAGNOSIS — I509 Heart failure, unspecified: Secondary | ICD-10-CM

## 2024-01-08 DIAGNOSIS — E876 Hypokalemia: Secondary | ICD-10-CM | POA: Diagnosis not present

## 2024-01-08 DIAGNOSIS — I2510 Atherosclerotic heart disease of native coronary artery without angina pectoris: Secondary | ICD-10-CM | POA: Diagnosis not present

## 2024-01-08 DIAGNOSIS — R918 Other nonspecific abnormal finding of lung field: Secondary | ICD-10-CM | POA: Diagnosis not present

## 2024-01-08 DIAGNOSIS — I700 Atherosclerosis of aorta: Secondary | ICD-10-CM | POA: Diagnosis not present

## 2024-01-08 DIAGNOSIS — R0609 Other forms of dyspnea: Secondary | ICD-10-CM | POA: Diagnosis not present

## 2024-01-08 DIAGNOSIS — J9811 Atelectasis: Secondary | ICD-10-CM | POA: Diagnosis not present

## 2024-01-08 DIAGNOSIS — Z1152 Encounter for screening for COVID-19: Secondary | ICD-10-CM | POA: Diagnosis not present

## 2024-01-08 DIAGNOSIS — E860 Dehydration: Secondary | ICD-10-CM | POA: Diagnosis present

## 2024-01-08 DIAGNOSIS — R079 Chest pain, unspecified: Secondary | ICD-10-CM | POA: Diagnosis not present

## 2024-01-08 DIAGNOSIS — R059 Cough, unspecified: Secondary | ICD-10-CM | POA: Diagnosis not present

## 2024-01-08 DIAGNOSIS — I255 Ischemic cardiomyopathy: Secondary | ICD-10-CM | POA: Diagnosis not present

## 2024-01-08 DIAGNOSIS — R7401 Elevation of levels of liver transaminase levels: Secondary | ICD-10-CM | POA: Diagnosis present

## 2024-01-08 DIAGNOSIS — E871 Hypo-osmolality and hyponatremia: Secondary | ICD-10-CM | POA: Diagnosis not present

## 2024-01-08 DIAGNOSIS — I2489 Other forms of acute ischemic heart disease: Secondary | ICD-10-CM | POA: Diagnosis not present

## 2024-01-08 DIAGNOSIS — J9601 Acute respiratory failure with hypoxia: Secondary | ICD-10-CM | POA: Diagnosis not present

## 2024-01-08 DIAGNOSIS — G4733 Obstructive sleep apnea (adult) (pediatric): Secondary | ICD-10-CM | POA: Diagnosis present

## 2024-01-08 DIAGNOSIS — R6520 Severe sepsis without septic shock: Secondary | ICD-10-CM | POA: Diagnosis present

## 2024-01-08 DIAGNOSIS — J189 Pneumonia, unspecified organism: Secondary | ICD-10-CM | POA: Diagnosis present

## 2024-01-08 DIAGNOSIS — R0989 Other specified symptoms and signs involving the circulatory and respiratory systems: Secondary | ICD-10-CM | POA: Diagnosis not present

## 2024-01-08 DIAGNOSIS — E785 Hyperlipidemia, unspecified: Secondary | ICD-10-CM | POA: Diagnosis not present

## 2024-01-08 DIAGNOSIS — I272 Pulmonary hypertension, unspecified: Secondary | ICD-10-CM | POA: Diagnosis not present

## 2024-01-08 DIAGNOSIS — J984 Other disorders of lung: Secondary | ICD-10-CM | POA: Diagnosis not present

## 2024-01-08 DIAGNOSIS — R9389 Abnormal findings on diagnostic imaging of other specified body structures: Secondary | ICD-10-CM | POA: Diagnosis not present

## 2024-01-08 DIAGNOSIS — Z9861 Coronary angioplasty status: Secondary | ICD-10-CM | POA: Diagnosis not present

## 2024-01-08 DIAGNOSIS — I517 Cardiomegaly: Secondary | ICD-10-CM | POA: Diagnosis not present

## 2024-01-08 DIAGNOSIS — I5023 Acute on chronic systolic (congestive) heart failure: Secondary | ICD-10-CM | POA: Diagnosis not present

## 2024-01-08 DIAGNOSIS — I493 Ventricular premature depolarization: Secondary | ICD-10-CM | POA: Diagnosis not present

## 2024-01-08 DIAGNOSIS — D638 Anemia in other chronic diseases classified elsewhere: Secondary | ICD-10-CM | POA: Diagnosis not present

## 2024-01-08 DIAGNOSIS — R042 Hemoptysis: Secondary | ICD-10-CM | POA: Diagnosis present

## 2024-01-08 DIAGNOSIS — E8809 Other disorders of plasma-protein metabolism, not elsewhere classified: Secondary | ICD-10-CM | POA: Diagnosis present

## 2024-01-08 DIAGNOSIS — I252 Old myocardial infarction: Secondary | ICD-10-CM | POA: Diagnosis not present

## 2024-01-08 DIAGNOSIS — I110 Hypertensive heart disease with heart failure: Secondary | ICD-10-CM | POA: Diagnosis not present

## 2024-01-08 DIAGNOSIS — J90 Pleural effusion, not elsewhere classified: Secondary | ICD-10-CM | POA: Diagnosis not present

## 2024-01-08 DIAGNOSIS — I5043 Acute on chronic combined systolic (congestive) and diastolic (congestive) heart failure: Secondary | ICD-10-CM | POA: Diagnosis not present

## 2024-01-08 DIAGNOSIS — I4729 Other ventricular tachycardia: Secondary | ICD-10-CM | POA: Diagnosis not present

## 2024-01-08 DIAGNOSIS — M19042 Primary osteoarthritis, left hand: Secondary | ICD-10-CM | POA: Diagnosis present

## 2024-01-08 DIAGNOSIS — J9691 Respiratory failure, unspecified with hypoxia: Secondary | ICD-10-CM | POA: Diagnosis not present

## 2024-01-08 DIAGNOSIS — I340 Nonrheumatic mitral (valve) insufficiency: Secondary | ICD-10-CM | POA: Diagnosis not present

## 2024-01-08 DIAGNOSIS — J123 Human metapneumovirus pneumonia: Secondary | ICD-10-CM | POA: Diagnosis present

## 2024-01-08 DIAGNOSIS — R0602 Shortness of breath: Secondary | ICD-10-CM | POA: Diagnosis not present

## 2024-01-08 DIAGNOSIS — I10 Essential (primary) hypertension: Secondary | ICD-10-CM | POA: Diagnosis not present

## 2024-01-08 LAB — RESPIRATORY PANEL BY PCR

## 2024-01-08 LAB — RESP PANEL BY RT-PCR (RSV, FLU A&B, COVID)  RVPGX2
Influenza A by PCR: NEGATIVE
Influenza B by PCR: NEGATIVE
Resp Syncytial Virus by PCR: NEGATIVE
SARS Coronavirus 2 by RT PCR: NEGATIVE

## 2024-01-08 LAB — LACTIC ACID, PLASMA: Lactic Acid, Venous: 1.7 mmol/L (ref 0.5–1.9)

## 2024-01-08 LAB — PROCALCITONIN: Procalcitonin: <0.1 ng/mL

## 2024-01-08 LAB — TROPONIN I (HIGH SENSITIVITY): Troponin I (High Sensitivity): 65 ng/L — ABNORMAL HIGH (ref <18)

## 2024-01-08 MED ORDER — DM-GUAIFENESIN ER 30-600 MG PO TB12
1.0000 | ORAL_TABLET | Freq: Two times a day (BID) | ORAL | Status: DC
  Administered 2024-01-08 – 2024-01-11 (×8): 1 via ORAL
  Filled 2024-01-08 (×8): qty 1

## 2024-01-08 MED ORDER — METOPROLOL SUCCINATE ER 25 MG PO TB24
12.5000 mg | ORAL_TABLET | Freq: Every day | ORAL | Status: DC
Start: 2024-01-08 — End: 2024-01-16
  Administered 2024-01-08 – 2024-01-15 (×8): 12.5 mg via ORAL
  Filled 2024-01-08 (×8): qty 1

## 2024-01-08 MED ORDER — ENOXAPARIN SODIUM 40 MG/0.4ML IJ SOSY
40.0000 mg | PREFILLED_SYRINGE | INTRAMUSCULAR | Status: DC
  Administered 2024-01-08 – 2024-01-16 (×9): 40 mg via SUBCUTANEOUS
  Filled 2024-01-08 (×9): qty 0.4

## 2024-01-08 MED ORDER — ACETAMINOPHEN 325 MG PO TABS: 650.0000 mg | ORAL_TABLET | Freq: Four times a day (QID) | ORAL | Status: DC | PRN

## 2024-01-08 MED ORDER — IOHEXOL 350 MG/ML SOLN
75.0000 mL | Freq: Once | INTRAVENOUS | Status: AC | PRN
  Administered 2024-01-08: 75 mL via INTRAVENOUS

## 2024-01-08 MED ORDER — IRBESARTAN 300 MG PO TABS
300.0000 mg | ORAL_TABLET | Freq: Every day | ORAL | Status: DC
  Administered 2024-01-08 – 2024-01-14 (×7): 300 mg via ORAL
  Filled 2024-01-08 (×7): qty 1

## 2024-01-08 MED ORDER — TAMSULOSIN HCL 0.4 MG PO CAPS
0.4000 mg | ORAL_CAPSULE | Freq: Every day | ORAL | Status: DC
  Administered 2024-01-08 – 2024-01-18 (×11): 0.4 mg via ORAL
  Filled 2024-01-08 (×11): qty 1

## 2024-01-08 MED ORDER — ASPIRIN 81 MG PO TBEC
81.0000 mg | DELAYED_RELEASE_TABLET | Freq: Every day | ORAL | Status: DC
  Administered 2024-01-09 – 2024-01-19 (×10): 81 mg via ORAL
  Filled 2024-01-08 (×10): qty 1

## 2024-01-08 MED ORDER — FUROSEMIDE 10 MG/ML IJ SOLN
20.0000 mg | Freq: Two times a day (BID) | INTRAMUSCULAR | Status: DC
  Administered 2024-01-08: 20 mg via INTRAVENOUS
  Filled 2024-01-08: qty 2

## 2024-01-08 MED ORDER — LINEZOLID 600 MG/300ML IV SOLN
600.0000 mg | Freq: Two times a day (BID) | INTRAVENOUS | Status: DC
  Administered 2024-01-08 (×2): 600 mg via INTRAVENOUS
  Filled 2024-01-08 (×2): qty 300

## 2024-01-08 MED ORDER — IPRATROPIUM BROMIDE 0.02 % IN SOLN
RESPIRATORY_TRACT | Status: AC
  Filled 2024-01-08: qty 2.5

## 2024-01-08 MED ORDER — IPRATROPIUM BROMIDE 0.02 % IN SOLN
0.5000 mg | RESPIRATORY_TRACT | Status: AC
  Administered 2024-01-08: 0.5 mg via RESPIRATORY_TRACT
  Filled 2024-01-08: qty 2.5

## 2024-01-08 MED ORDER — SODIUM CHLORIDE 0.9 % IV SOLN
2.0000 g | Freq: Three times a day (TID) | INTRAVENOUS | Status: DC
  Administered 2024-01-08 (×2): 2 g via INTRAVENOUS
  Filled 2024-01-08 (×2): qty 12.5

## 2024-01-08 MED ORDER — BENZONATATE 100 MG PO CAPS
200.0000 mg | ORAL_CAPSULE | Freq: Three times a day (TID) | ORAL | Status: DC
  Administered 2024-01-08 – 2024-01-19 (×32): 200 mg via ORAL
  Filled 2024-01-08 (×33): qty 2

## 2024-01-08 MED ORDER — ROSUVASTATIN CALCIUM 20 MG PO TABS
20.0000 mg | ORAL_TABLET | Freq: Every day | ORAL | Status: DC
  Administered 2024-01-08 – 2024-01-19 (×11): 20 mg via ORAL
  Filled 2024-01-08 (×12): qty 1

## 2024-01-08 MED ORDER — ACETAMINOPHEN 500 MG PO TABS
500.0000 mg | ORAL_TABLET | Freq: Four times a day (QID) | ORAL | Status: DC | PRN
  Administered 2024-01-08 – 2024-01-17 (×9): 500 mg via ORAL
  Filled 2024-01-08 (×8): qty 1

## 2024-01-08 MED ORDER — ACETAMINOPHEN 650 MG RE SUPP: 650.0000 mg | Freq: Four times a day (QID) | RECTAL | Status: DC | PRN

## 2024-01-08 MED ORDER — TRAMADOL HCL 50 MG PO TABS
50.0000 mg | ORAL_TABLET | Freq: Four times a day (QID) | ORAL | Status: DC | PRN
  Administered 2024-01-08 – 2024-01-12 (×4): 50 mg via ORAL
  Filled 2024-01-08 (×4): qty 1

## 2024-01-08 NOTE — Plan of Care (Signed)

## 2024-01-08 NOTE — Progress Notes (Deleted)
 [name]

## 2024-01-08 NOTE — H&P (Signed)
 History and Physical    Chase Combs FMW:[id number] DOB: 05/06/1946 DOA: 01/07/2024  PCP: Wonda Worth SQUIBB, PA  Patient coming from: [facility] ED  Chief Complaint: Shortness of breath  HPI: Chase Combs is a 78 y.o. 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  He was admitted to the hospital 5/17-10/27/2023 for opioid induced ileus versus SBO, possible colitis, and aspiration pneumonia.  Patient is presenting with a chief complaint of shortness of breath.  Reporting 3-day history of shortness of breath, cough, and rhinorrhea.  Also reporting swelling of his legs for the past 3 months which has gotten progressively worse.  He does not take any diuretics at home.  He is reporting mild substernal chest pressure unclear when this started.  Denies vomiting, abdominal pain, or diarrhea.  ED Course: Tachycardic, tachypneic, and hypoxemic requiring 4 L [place] to maintain sats in the 90s.  Afebrile.  Not hypotensive.  Labs showing no leukocytosis, hemoglobin 11.0 (stable compared to labs done over the past few months), initial troponin 51 and repeat pending, proBNP 15,245, ABG with pH 7.43/pCO2 40.2/PO2 71, blood cultures in process, lactic acid normal, COVID/influenza/RSV PCR negative.  CTA chest negative for PE.  Showing extensive patchy airspace disease throughout the right lung most compatible with pneumonia.  Showing small left pleural effusion with left lower lobe atelectasis.  EKG showing sinus tachycardia with PACs and no STEMI.  Patient was given ipratropium neb, cefepime , and linezolid .  Review of Systems:  Review of Systems  All other systems reviewed and are negative.   Past Medical History:  Diagnosis Date   Abdominal hernia    per pt   Arthritis    hands    Benign localized prostatic hyperplasia with lower urinary tract symptoms (LUTS)    CAD (coronary artery disease) 05/2004   cardiologist--- dr anner;  positive stress test , had outpt cardiac cath 05-27-2004 stenosis RI;  06-18-2004 cath w/ PCI and BMS x1 to pRI;   STEMI 02-16-2009  s/p cath w/ PCI thrombectomy for total occluded RCA , BMS x1 to pRCA;  nuclear stress test 03-15-2012 low risk no ischemia but sig inferior-inferolateral infarct , ef 51%   Dyslipidemia    Essential hypertension    Heart murmur    History of COVID-19 05/2020   per pt mild symptoms that resolved   History of ST elevation myocardial infarction (STEMI) 02/16/2009   inferior STEMI   cath w/ pci w/ BMS to pRCA   History of urinary retention    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[facility]) 04/2021   urologist--- dr borden/ radiation oncology-- dr patrcia;  dx 12/ 2022,  Gleason 4+5, PSA 24.6, volume 94.6cc;  plan to start IMRT   OSA (obstructive sleep apnea)    per pt dx approx 2008, no cpap intolerant   PONV (postoperative nausea and vomiting)    Pre-diabetes    S/p bare metal coronary artery stent    01/ 2006  x1 BMS to pRI (CoStar study stent);  and 09/ 2010  x1 BMS to [facility]    Past Surgical History:  Procedure Laterality Date   CARDIAC CATHETERIZATION  05/27/2004   outpatient by dr burnard [facility] cardiology) for positive stress test;  80% stensis pRI   CORONARY ANGIOPLASTY WITH STENT PLACEMENT  06/18/2004   @[facility]  by  Dr Lavon;  PCI w/ BMS to pRI  (CoStar study stent - 2.5 x 16)   CORONARY ANGIOPLASTY WITH STENT PLACEMENT  02/16/2009   @[facility]  by dr verlin;   Inferior STEMI:  PCI w/ thrombectomy total occluded RCA, BMS x1 to pRCA (Vision BMS 3.5 x 23 -> 4.0 mm), residual 40-50% pLAD, ef 45-50%   FLEXIBLE SIGMOIDOSCOPY N/A 10/20/2023   Procedure: SIGMOIDOSCOPY, FLEXIBLE;  Surgeon: Legrand Victory LITTIE DOUGLAS, MD;  Location: [facility];  Service: Gastroenterology;  Laterality: N/A;   GOLD SEED IMPLANT N/A 09/19/2021    Procedure: GOLD SEED IMPLANT;  Surgeon: Cam Morene ORN, MD;  Location: [facility];  Service: Urology;  Laterality: N/A;  ONLY NEEDS 30 MIN FOR ALL   INGUINAL HERNIA REPAIR Right 09/02/1999   @[facility] ;   recurrent repair right inguinal hernia;   previous repair approx 1990s   INGUINAL HERNIA REPAIR Left    1990s   KNEE ARTHROSCOPY W/ MENISCAL REPAIR Right 12/18/2005   @[facility]    LUMBAR DISC SURGERY  1982   L5--S1   SPACE OAR INSTILLATION N/A 09/19/2021   Procedure: SPACE OAR INSTILLATION;  Surgeon: Cam Morene ORN, MD;  Location: [facility];  Service: Urology;  Laterality: N/A;   TOTAL KNEE ARTHROPLASTY Left 10/01/2023   Procedure: ARTHROPLASTY, KNEE, TOTAL;  Surgeon: Kay Kemps, MD;  Location: [facility];  Service: Orthopedics;  Laterality: Left;     reports that he has never smoked. He has never used smokeless tobacco. He reports that he does not drink alcohol and does not use drugs.  Allergies  Allergen Reactions   Codeine Nausea And Vomiting   Latex Rash    Oral blisters when used at dentist    Family History  Problem Relation Age of Onset   Lung cancer Mother    Colon cancer Neg Hx    Esophageal cancer Neg Hx     Prior to Admission medications   Medication Sig Start Date End Date Taking? Authorizing Provider  acetaminophen  (TYLENOL ) 500 MG tablet Take 500 mg by mouth every 6 (six) hours as needed.    [provider]  amLODipine  (NORVASC ) 10 MG tablet TAKE 1 TABLET BY MOUTH EVERY DAY 01/07/24   Anner Alm ORN, MD  aspirin  EC 81 MG tablet Take 81 mg by mouth 2 (two) times daily.    [provider]  Cannabidiol  POWD Apply 1 Application topically 3 (three) times daily as needed (knee pain.). CBD cream    [provider]  cyanocobalamin  (VITAMIN B12) 500 MCG tablet Take 500 mcg by mouth daily.    [provider]  metoprolol  succinate (TOPROL -XL) 25 MG 24 hr tablet Take 0.5 tablets (12.5 mg total) by mouth daily.  06/21/23   Anner Alm ORN, MD  Multiple Vitamin (MULTIVITAMIN WITH MINERALS) TABS tablet Take 1 tablet by mouth in the morning.    [provider]  rosuvastatin  (CRESTOR ) 20 MG tablet TAKE 1 TABLET(20 MG) BY MOUTH DAILY 11/29/23   Anner Alm ORN, MD  tamsulosin  (FLOMAX ) 0.4 MG CAPS capsule Take 1 capsule (0.4 mg total) by mouth daily after supper. 11/17/21   Patrcia Cough, MD  traMADol  (ULTRAM ) 50 MG tablet Take 1 tablet (50 mg total) by mouth every 6 (six) hours as needed for moderate pain (pain score 4-6) or severe pain (pain score 7-10). 10/27/23   Pokhrel, Vernal, MD  valsartan  (DIOVAN ) 320 MG tablet TAKE 1 TABLET BY MOUTH EVERY DAY 01/14/23   Anner Alm ORN, MD  Physical Exam: Vitals:   01/08/24 0400 01/08/24 0440 01/08/24 0441 01/08/24 0456  BP:      Pulse:      Resp: 18 (!) 26 (!) 23 (!) 22  Temp:      TempSrc:      SpO2:      Weight:      Height:        Physical Exam Vitals reviewed.  Constitutional:      General: He is not in acute distress. HENT:     Head: Normocephalic and atraumatic.  Eyes:     Extraocular Movements: Extraocular movements intact.  Cardiovascular:     Rate and Rhythm: Regular rhythm. Tachycardia present.     Pulses: Normal pulses.  Pulmonary:     Effort: Pulmonary effort is normal. No respiratory distress.     Breath sounds: Rhonchi present.  Abdominal:     General: Bowel sounds are normal.     Palpations: Abdomen is soft.     Tenderness: There is no abdominal tenderness. There is no guarding.  Musculoskeletal:     Cervical back: Normal range of motion.     Right lower leg: Edema present.     Left lower leg: Edema present.     Comments: 3+ pitting edema of bilateral lower legs  Skin:    General: Skin is warm and dry.  Neurological:     General: No focal deficit present.     Mental Status: He is alert and oriented to person, place, and time.     Labs on Admission: I have personally reviewed following labs and imaging  studies  CBC: Recent Labs  Lab 01/07/24 2154 01/07/24 2330  WBC 6.2  --   HGB 11.0* 11.6*  HCT 34.6* 34.0*  MCV 92.0  --   PLT 320  --    Basic Metabolic Panel: Recent Labs  Lab 01/07/24 2154 01/07/24 2330  NA 139 139  K 3.6 3.4*  CL 101  --   CO2 25  --   GLUCOSE 122*  --   BUN 21  --   CREATININE 0.76  --   CALCIUM  8.8*  --    GFR: Estimated Creatinine Clearance: 94.4 mL/min (by C-G formula based on SCr of 0.76 mg/dL). Liver Function Tests: No results for input(s): AST, ALT, ALKPHOS, BILITOT, PROT, ALBUMIN in the last 168 hours. No results for input(s): LIPASE, AMYLASE in the last 168 hours. No results for input(s): AMMONIA in the last 168 hours. Coagulation Profile: No results for input(s): INR, PROTIME in the last 168 hours. Cardiac Enzymes: No results for input(s): CKTOTAL, CKMB, CKMBINDEX, TROPONINI in the last 168 hours. BNP (last 3 results) Recent Labs    01/07/24 2301  PROBNP 15,245.0*   HbA1C: No results for input(s): HGBA1C in the last 72 hours. CBG: No results for input(s): GLUCAP in the last 168 hours. Lipid Profile: No results for input(s): CHOL, HDL, LDLCALC, TRIG, CHOLHDL, LDLDIRECT in the last 72 hours. Thyroid  Function Tests: No results for input(s): TSH, T4TOTAL, FREET4, T3FREE, THYROIDAB in the last 72 hours. Anemia Panel: No results for input(s): VITAMINB12, FOLATE, FERRITIN, TIBC, IRON, RETICCTPCT in the last 72 hours. Urine analysis:    Component Value Date/Time   COLORURINE AMBER (A) 10/16/2023 1533   APPEARANCEUR HAZY (A) 10/16/2023 1533   LABSPEC 1.024 10/16/2023 1533   PHURINE 5.0 10/16/2023 1533   GLUCOSEU NEGATIVE 10/16/2023 1533   HGBUR NEGATIVE 10/16/2023 1533   BILIRUBINUR NEGATIVE 10/16/2023 1533   KETONESUR NEGATIVE 10/16/2023  1533   PROTEINUR 30 (A) 10/16/2023 1533   UROBILINOGEN 0.2 02/17/2009 0645   NITRITE NEGATIVE 10/16/2023 1533   LEUKOCYTESUR  NEGATIVE 10/16/2023 1533    Radiological Exams on Admission: CT Angio Chest Pulmonary Embolism (PE) W or WO Contrast Result Date: 01/08/2024 CLINICAL DATA:  Pulmonary embolism (PE) suspected, high prob. Shortness of breath EXAM: CT ANGIOGRAPHY CHEST WITH CONTRAST TECHNIQUE: Multidetector CT imaging of the chest was performed using the standard protocol during bolus administration of intravenous contrast. Multiplanar CT image reconstructions and MIPs were obtained to evaluate the vascular anatomy. RADIATION DOSE REDUCTION: This exam was performed according to the departmental dose-optimization program which includes automated exposure control, adjustment of the mA and/or kV according to patient size and/or use of iterative reconstruction technique. CONTRAST:  75mL OMNIPAQUE  IOHEXOL  350 MG/ML SOLN COMPARISON:  10/10/2023.  Chest x-ray today. FINDINGS: Cardiovascular: Cardiomegaly. Three-vessel coronary artery disease and moderate aortic atherosclerosis. No evidence of aortic aneurysm. Mediastinum/Nodes: No mediastinal, hilar, or axillary adenopathy. Trachea and esophagus are unremarkable. Lungs/Pleura: Patchy airspace disease throughout the right lung most compatible with pneumonia. Small left pleural effusion. Compressive atelectasis in the left lower lobe. Upper Abdomen: No acute findings Musculoskeletal: Chest wall soft tissues are unremarkable. No acute bony abnormality. Review of the MIP images confirms the above findings. IMPRESSION: Extensive patchy airspace disease throughout the right lung most compatible with pneumonia. Small left pleural effusion with left lower lobe atelectasis. Cardiomegaly, diffuse 3 vessel coronary artery disease. No evidence of pulmonary embolus. Aortic Atherosclerosis (ICD10-I70.0). Electronically Signed   By: Franky Crease M.D.   On: 01/08/2024 00:53   DG Chest Port 1 View Result Date: 01/07/2024 CLINICAL DATA:  Shortness of breath. EXAM: PORTABLE CHEST 1 VIEW COMPARISON:   10/16/2023 FINDINGS: Stable cardiomediastinal silhouette. Patchy airspace opacities in the right lung. Central lucency in the opacities in the right upper lung. Left basilar atelectasis or pneumonia. Small bilateral pleural effusions. No pneumothorax. IMPRESSION: 1. Multifocal pneumonia in the right lung. Central lucency in the opacities in the right upper lung concerning for cavitation. CT is recommended for further evaluation. 2. Left basilar atelectasis or pneumonia. 3. Small bilateral pleural effusions. Electronically Signed   By: Norman Gatlin M.D.   On: 01/07/2024 23:28    Assessment and Plan  Acute hypoxemic respiratory failure secondary to community-acquired pneumonia SIRS/ ?sepsis Tachycardic, tachypneic, and hypoxemic requiring 4 L [place] to maintain sats in the 90s.  No fever, leukocytosis, lactic acidosis, or hypotension.  CTA chest negative for PE.  Showing extensive patchy airspace disease throughout the right lung most compatible with pneumonia.  COVID/influenza/RSV PCR negative.  ?Bacterial or aspiration pneumonia (given previous history) but no leukocytosis. Continue linezolid  and cefepime .  MRSA PCR screen.  Check procalcitonin level.  Full respiratory viral panel ordered.  Follow-up blood cultures.  Continue supplemental oxygen, wean as tolerated.  Acute on chronic HFmrEF proBNP 15,245 and has peripheral edema on exam.  CT chest showing small left pleural effusion but no pulmonary edema.  Patient is not on diuretics at home.  Echo done 07/12/2023 showing EF 40 to 45%, grade 1 diastolic dysfunction, trivial mitral regurgitation.  Start IV Lasix  20 mg twice daily.  Monitor intake and output, daily weights.  Chest pain CAD with history of MI s/p coronary stent x 2 Patient is endorsing mild substernal chest pressure but unclear when this started.  Troponin 51 and EKG showing no STEMI.  He did have a high risk stress test back in February 2025 due to systolic dysfunction but  no ischemia seen  on the study.  Follow-up repeat troponin which is currently in process and consult cardiology if there is concern for ACS.  Normocytic anemia Hemoglobin stable compared to labs done over the past few months.  Hypertension: Continue IV Lasix  as above. Hyperlipidemia Pharmacy med rec pending.  DVT prophylaxis: Lovenox  Code Status: Full Code (discussed with the patient) Family Communication: No family available at this time. Level of care: Progressive Care Unit Admission status: It is my clinical opinion that admission to INPATIENT is reasonable and necessary because of the expectation that this patient will require hospital care that crosses at least 2 midnights to treat this condition based on the medical complexity of the problems presented.  Given the aforementioned information, the predictability of an adverse outcome is felt to be significant.   Editha Ram MD [facility]  If 7PM-7AM, please contact night-coverage [email or web address]  01/08/2024, 5:55 AM

## 2024-01-08 NOTE — Progress Notes (Signed)
 Plan of Care Note for accepted transfer   Patient: Chase Combs MRN: [id number]   DOA: 01/07/2024  Facility requesting transfer: [facility]   Requesting Provider: Dr. Haze  Reason for transfer: Pneumonia, hypoxia   Facility course: 78 yr old man with HTN, CAD, and HFmrEF presenting with 5-6 days SOB.   He is tachypneic and hypoxemic. CT demonstrates pneumonia.   He was treated with cefepime  and linezolid .   Plan of care: The patient is accepted for admission to Progressive unit, at [facility]   Author: Evalene GORMAN Sprinkles, MD 01/08/2024  Check [email or web address] for on-call coverage.  Nursing staff, Please call [facility] Admits & Consults System-Wide number on Amion as soon as patient's arrival, so appropriate admitting provider can evaluate the pt.

## 2024-01-08 NOTE — Plan of Care (Signed)

## 2024-01-08 NOTE — ED Notes (Signed)
-  Called [facility] at 150am for transportation to [facility].

## 2024-01-08 NOTE — Progress Notes (Signed)
 [facility] Progress Note  Patient: Chase Combs     FMW:[id number]  DOA: 01/07/2024   PCP: Wonda Worth SQUIBB, PA       Brief hospital course: Chase Combs is a 78 y.o. 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  He was admitted to the hospital 5/17-10/27/2023 for opioid induced ileus versus SBO, possible colitis, and aspiration pneumonia.   Patient is presenting with a chief complaint of shortness of breath.  Reporting 3-day history of shortness of breath, cough, and rhinorrhea.  Also reporting swelling of his legs for the past 3 months which has gotten progressively worse.  He does not take any diuretics at home.  He is reporting mild substernal chest pressure unclear when this started.  Denies vomiting, abdominal pain, or diarrhea.  Subjective:  Continues to have cough and shortness of breath. Dyspnea not as severe as yesterday. Pedal edema still present. Oral intake including fluid intake poor today.   Assessment and Plan: Principal Problem:   Pneumonia with sepsis  Acute hypoxemic respiratory failure   - right sided infiltrate (similar to last admission in May) - has persistent tachycardia and tachypnea  - Procalcitonin < 0.10 - Metapneumovirus positive today - stop antibiotics - cont Oxygen and add Guaifenesin / Dextromethorphan  and Tussionex- he is allergic to Codeine.   Active Problems: Chronic HFrEF - Echo done 07/12/2023 showing EF 40 to 45%, grade 1 diastolic dysfunction, trivial mitral regurgitation.  - hold IV Lasix  as he does not appear to have pulmonary edema on exam today - cont ARB and Toprol    CAD S/P percutaneous coronary angioplasty - troponin 51, likely due to demand ischemia  Pedal edema - elevated LE   HTN - hold  Amlodipine  for now      Code Status: Full Code Total time on patient care: 35 min DVT prophylaxis:  enoxaparin  (LOVENOX ) injection 40 mg Start: 01/08/24 1000   Objective:   Vitals:   01/08/24 0456 01/08/24 0600 01/08/24 0819 01/08/24 1328  BP:   (!) 136/97 (!) 123/90  Pulse:      Resp: (!) 22  (!) 23 (!) 24  Temp:   97.7 F (36.5 C) 98.8 F (37.1 C)  TempSrc:   Oral Oral  SpO2:   95% 98%  Weight:  95.1 kg    Height:  6' (1.829 m)     Filed Weights   01/07/24 2150 01/08/24 0600  Weight: 96.2 kg 95.1 kg   Exam: General exam: Appears comfortable  HEENT: oral mucosa moist Respiratory system: extensive coarse crackles in right lung field, cough present, tachypnea noted  Cardiovascular system: S1 & S2 heard - tachycardic Gastrointestinal system: Abdomen soft, non-tender, nondistended. Normal bowel sounds   Extremities: No cyanosis, clubbing - 2 + pedal edema noted Psychiatry:  Mood & affect appropriate.      CBC: Recent Labs  Lab 01/07/24 2154 01/07/24 2330  WBC 6.2  --   HGB 11.0* 11.6*  HCT 34.6* 34.0*  MCV 92.0  --   PLT 320  --    Basic Metabolic Panel: Recent Labs  Lab 01/07/24 2154 01/07/24 2330  NA 139 139  K 3.6 3.4*  CL 101  --   CO2 25  --   GLUCOSE 122*  --   BUN 21  --  CREATININE 0.76  --   CALCIUM  8.8*  --      Scheduled Meds:  benzonatate   200 mg Oral TID   dextromethorphan -guaiFENesin   1 tablet Oral BID   enoxaparin  (LOVENOX ) injection  40 mg Subcutaneous Q24H   irbesartan   300 mg Oral Daily   metoprolol  succinate  12.5 mg Oral Daily   rosuvastatin   20 mg Oral Daily   tamsulosin   0.4 mg Oral QPC supper    Imaging and lab data personally reviewed   Author: Denny Mccree  01/08/2024 6:22 PM  To contact [facility]>   Check the care team in [facility] and look for the attending/consulting [facility] provider listed  Log into [email or web address] and use [place]'s universal password   Go to> [facility]  and find provider  If  you still have difficulty reaching the provider, please page the [facility] (Director on Call) for the Hospitalists listed on amion

## 2024-01-09 DIAGNOSIS — J189 Pneumonia, unspecified organism: Secondary | ICD-10-CM | POA: Diagnosis not present

## 2024-01-09 LAB — BASIC METABOLIC PANEL WITH GFR
Anion gap: 13 (ref 5–15)
Anion gap: 12 (ref 5–15)
BUN: 22 mg/dL (ref 8–23)
BUN: 23 mg/dL (ref 8–23)
CO2: 26 mmol/L (ref 22–32)
CO2: 27 mmol/L (ref 22–32)
Calcium: 8.7 mg/dL — ABNORMAL LOW (ref 8.9–10.3)
Calcium: 8.6 mg/dL — ABNORMAL LOW (ref 8.9–10.3)
Chloride: 97 mmol/L — ABNORMAL LOW (ref 98–111)
Chloride: 96 mmol/L — ABNORMAL LOW (ref 98–111)
Creatinine, Ser: 0.61 mg/dL (ref 0.61–1.24)
Creatinine, Ser: 0.68 mg/dL (ref 0.61–1.24)
GFR, Estimated: >60 mL/min (ref >60)
GFR, Estimated: >60 mL/min (ref >60)
Glucose, Bld: 143 mg/dL — ABNORMAL HIGH (ref 70–99)
Glucose, Bld: 128 mg/dL — ABNORMAL HIGH (ref 70–99)
Potassium: 3.6 mmol/L (ref 3.5–5.1)
Potassium: 3.6 mmol/L (ref 3.5–5.1)
Sodium: 136 mmol/L (ref 135–145)
Sodium: 135 mmol/L (ref 135–145)

## 2024-01-09 MED ORDER — ACETYLCYSTEINE 20 % IN SOLN
600.0000 mg | Freq: Two times a day (BID) | RESPIRATORY_TRACT | Status: AC
  Administered 2024-01-09 – 2024-01-10 (×3): 600 mg via RESPIRATORY_TRACT
  Filled 2024-01-09 (×4): qty 4

## 2024-01-09 MED ORDER — ALBUTEROL SULFATE (2.5 MG/3ML) 0.083% IN NEBU
2.5000 mg | INHALATION_SOLUTION | Freq: Two times a day (BID) | RESPIRATORY_TRACT | Status: DC
  Administered 2024-01-09 – 2024-01-11 (×6): 2.5 mg via RESPIRATORY_TRACT
  Filled 2024-01-09 (×6): qty 3

## 2024-01-09 NOTE — Progress Notes (Signed)
   01/09/24 1034  BiPAP/CPAP/SIPAP  Reason BIPAP/CPAP not in use (S)   (BIPAP PRN not indicated at this time, 96% 5 L [place], continous pulse ox @ bedside. Will continue to monitor.)  FiO2 (%) 40 %  BiPAP/CPAP /SiPAP Vitals  Resp 20  SpO2 96 %  MEWS Score/Color  MEWS Score 0  MEWS Score Color Green

## 2024-01-09 NOTE — Plan of Care (Signed)
  Problem: Clinical Measurements: Goal: Ability to maintain clinical measurements within normal limits will improve Outcome: Progressing   Problem: Activity: Goal: Risk for activity intolerance will decrease Outcome: Progressing   Problem: Pain Managment: Goal: General experience of comfort will improve and/or be controlled Outcome: Progressing   Problem: Safety: Goal: Ability to remain free from injury will improve Outcome: Progressing

## 2024-01-09 NOTE — Progress Notes (Addendum)
 [facility] Progress Note  Patient: Chase Combs     FMW:[id number]  DOA: 01/07/2024   PCP: Wonda Worth SQUIBB, PA       Brief hospital course: [name] is a 78 y.o. 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  He was admitted to the hospital 5/17-10/27/2023 for opioid induced ileus versus SBO, possible colitis, and aspiration pneumonia.   Patient is presenting with a chief complaint of shortness of breath.  Reporting 3-day history of shortness of breath, cough, and rhinorrhea.  Also reporting swelling of his legs for the past 3 months which has gotten progressively worse.  He does not take any diuretics at home.  He is reporting mild substernal chest pressure unclear when this started.  Denies vomiting, abdominal pain, or diarrhea.  Subjective:  Not much change in symptoms today. He said he received something yesterday that helped his cough. He remains short of breath today. He is having bouts of severe cough and has to sit up on the side of his bed when he has these episodes. He is coughing up blood but is not as severe as a couple of days ago.   Assessment and Plan: Principal Problem:   Pneumonia due to metapneumovirus with sepsis  Acute hypoxemic respiratory failure   - right sided infiltrate (similar to last admission in May) - has persistent tachycardia and tachypnea  - Procalcitonin < 0.10 - 8/16> Metapneumovirus positive  - antibiotics stopped  - cont Oxygen, Guaifenesin / Dextromethorphan  and Tussionex- he is allergic to Codeine.  - pulse ox 81% on room air today - add PRN BiPAP for his tachypnea today- he is in agreement with using it - add Mucomyst  nebs and chest PT - confirmed code status with him today- he is a full code  Active  Problems: Chronic HFrEF - Echo done 07/12/2023 showing EF 40 to 45%, grade 1 diastolic dysfunction, trivial mitral regurgitation.  - holding IV Lasix  as he does not appear to have pulmonary edema - urine is concentrated   - cont ARB and Toprol    CAD S/P percutaneous coronary angioplasty - troponin 51, likely due to demand ischemia  Pedal edema - improved  HTN - hold Amlodipine  for now-      Code Status: Full Code Total time on patient care: 35 min DVT prophylaxis:  enoxaparin  (LOVENOX ) injection 40 mg Start: 01/08/24 1000   Objective:   Vitals:   01/09/24 1006 01/09/24 1034 01/09/24 1046 01/09/24 1343  BP: (!) 134/98   (!) 114/90  Pulse: 93   89  Resp:  20    Temp:    98.4 F (36.9 C)  TempSrc:    Oral  SpO2:  96% 96% 98%  Weight:      Height:       Filed Weights   01/07/24 2150 01/08/24 0600 01/09/24 0536  Weight: 96.2 kg 95.1 kg 100.4 kg   Exam: General exam: Appears comfortable  HEENT: oral mucosa moist Respiratory system: rhonchi throughout b/l lung fields, tachypnea and cough- able to speak in full sentences Cardiovascular system: S1 & S2 heard - tachycardic Gastrointestinal system: Abdomen soft, non-tender, nondistended. Normal bowel sounds   Extremities: No cyanosis, clubbing or edema Psychiatry:  Mood & affect appropriate.      CBC: Recent Labs  Lab 01/07/24  2154 01/07/24 2330  WBC 6.2  --   HGB 11.0* 11.6*  HCT 34.6* 34.0*  MCV 92.0  --   PLT 320  --    Basic Metabolic Panel: Recent Labs  Lab 01/07/24 2154 01/07/24 2330 01/09/24 0530  NA 139 139 136  K 3.6 3.4* 3.6  CL 101  --  97*  CO2 25  --  26  GLUCOSE 122*  --  143*  BUN 21  --  22  CREATININE 0.76  --  0.61  CALCIUM  8.8*  --  8.7*     Scheduled Meds:  acetylcysteine   600 mg Inhalation BID   albuterol   2.5 mg Nebulization BID   aspirin  EC  81 mg Oral Daily   benzonatate   200 mg Oral TID   dextromethorphan -guaiFENesin   1 tablet Oral BID   enoxaparin  (LOVENOX ) injection  40  mg Subcutaneous Q24H   irbesartan   300 mg Oral Daily   metoprolol  succinate  12.5 mg Oral Daily   rosuvastatin   20 mg Oral Daily   tamsulosin   0.4 mg Oral QPC supper    Imaging and lab data personally reviewed   Author: Chelby Salata  01/09/2024 2:12 PM  To contact [facility]>   Check the care team in [facility] and look for the attending/consulting TRH provider listed  Log into [email or web address] and use [place]'s universal password   Go to> [facility]  and find provider  If you still have difficulty reaching the provider, please page the [facility] (Director on Call) for the Hospitalists listed on amion

## 2024-01-10 DIAGNOSIS — J189 Pneumonia, unspecified organism: Secondary | ICD-10-CM | POA: Diagnosis not present

## 2024-01-10 LAB — CBC
HCT: 35.2 % — ABNORMAL LOW (ref 39.0–52.0)
Hemoglobin: 10.8 g/dL — ABNORMAL LOW (ref 13.0–17.0)
MCH: 28.8 pg (ref 26.0–34.0)
MCHC: 30.7 g/dL (ref 30.0–36.0)
MCV: 93.9 fL (ref 80.0–100.0)
Platelets: 264 K/uL (ref 150–400)
RBC: 3.75 MIL/uL — ABNORMAL LOW (ref 4.22–5.81)
RDW: 16.2 % — ABNORMAL HIGH (ref 11.5–15.5)
WBC: 7 K/uL (ref 4.0–10.5)
nRBC: 0 % (ref 0.0–0.2)

## 2024-01-10 MED ORDER — SODIUM CHLORIDE 0.9 % IV SOLN: INTRAVENOUS | Status: DC

## 2024-01-10 NOTE — TOC Initial Note (Signed)
 Transition of Care [facility]) - Initial/Assessment Note   Patient Details  Name: Chase Combs MRN: [id number] Date of Birth: [birth year]  [facility]) CM/SW Contact:    Duwaine GORMAN Aran, LCSW Phone Number: 01/10/2024, 9:20 AM  Clinical Narrative: Care management consulted for heart failure screening. Patient has been referred to heart failure navigation team, so consult will be cleared at this time.  Expected Discharge Plan: Home/Self Care Barriers to Discharge: Continued Medical Work up  Expected Discharge Plan and Services In-house Referral: Clinical Social Work Post Acute Care Choice: NA Living arrangements for the past 2 months: Single Family Home           DME Arranged: N/A DME Agency: NA  Prior Living Arrangements/Services Living arrangements for the past 2 months: Single Family Home Lives with:: Spouse Patient language and need for interpreter reviewed:: Yes Do you feel safe going back to the place where you live?: Yes      Need for Family Participation in Patient Care: No (Comment) Care giver support system in place?: Yes (comment) Criminal Activity/Legal Involvement Pertinent to Current Situation/Hospitalization: No - Comment as needed  Activities of Daily Living ADL Screening (condition at time of admission) Independently performs ADLs?: Yes (appropriate for developmental age) Is the patient deaf or have difficulty hearing?: Yes Does the patient have difficulty seeing, even when wearing glasses/contacts?: Yes Does the patient have difficulty concentrating, remembering, or making decisions?: No  Emotional Assessment Alcohol / Substance Use: Not Applicable Psych Involvement: No (comment)  Admission diagnosis:  Pneumonia [J18.9] Multifocal pneumonia [J18.9] Patient Active Problem List   Diagnosis Date Noted   Pneumonia 01/08/2024   Acute hypoxemic respiratory failure (HCC) 01/08/2024   Acute exacerbation of CHF (congestive heart failure) (HCC) 01/08/2024    Chest pain 01/08/2024   Transaminitis 10/21/2023   Acute diarrhea 10/19/2023   Abnormal finding on GI tract imaging 10/19/2023   Ileus, postoperative (HCC) 10/16/2023   Fever, unspecified 10/16/2023   Leukocytosis 10/16/2023   Hypokalemia 10/16/2023   Hypoxia 10/10/2023   Status post total knee replacement, 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   DOE (dyspnea on exertion) 06/21/2023   Atypical angina (HCC) 06/21/2023   Episode of dizziness 06/13/2022   Preop cardiovascular exam 07/03/2021   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(HCC) 06/17/2021   Hyperglycemia, unspecified 02/15/2020   Fatigue due to treatment 09/24/2017   Polypharmacy 06/10/2014   Hyperlipidemia associated with type 2 diabetes mellitus (HCC) 03/03/2013   Insomnia 03/03/2013   Essential hypertension    S/p ST elevation myocardial infarction (STEMI) of inferior wall (September 2010) 02/16/2009    Class: History of   CAD S/P percutaneous coronary angioplasty 06/18/2004   PCP:  Wonda Worth SQUIBB, PA Pharmacy:   [facility] [id number] [place], [place] - [address] AT [address] [place] [address] Phone: [phone] Fax: [phone]  [facility] #[id number] [place], [place] - [address] AT [facility] OF [facility] & [address] [place] [address] Phone: [phone] Fax: [phone]  Social Drivers of Health (SDOH) Social History: SDOH Screenings   Food Insecurity: No Food Insecurity (01/08/2024)  Housing: Low Risk  (01/08/2024)  Transportation Needs: No Transportation Needs (01/08/2024)  Utilities: Not At Risk (01/08/2024)  Social Connections: Socially Integrated (01/08/2024)  Tobacco Use: Low Risk  (01/07/2024)   SDOH Interventions:    Readmission Risk Interventions    10/21/2023   11:36 AM  Readmission Risk Prevention Plan  Transportation Screening Complete  PCP  or Specialist Appt within 5-7 Days Complete  Home Care Screening Complete  Medication Review (RN CM)  Complete

## 2024-01-10 NOTE — Progress Notes (Signed)
 [facility] Progress Note  Patient: Chase Combs     FMW:[id number]  DOA: 01/07/2024   PCP: Wonda Worth SQUIBB, PA       Brief hospital course: Chase Combs is a 78 y.o. 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  He was admitted to the hospital 5/17-10/27/2023 for opioid induced ileus versus SBO, possible colitis, and aspiration pneumonia.   Patient is presenting with a chief complaint of shortness of breath.  Reporting 3-day history of shortness of breath, cough, and rhinorrhea.  Also reporting swelling of his legs for the past 3 months which has gotten progressively worse.  He does not take any diuretics at home.  He is reporting mild substernal chest pressure unclear when this started.  Denies vomiting, abdominal pain, or diarrhea.  Subjective:  Cough is improving.  He continues to have hemoptysis.  Continues to be short of breath.  Assessment and Plan: Principal Problem:   Pneumonia due to metapneumovirus with sepsis  Acute hypoxemic respiratory failure   - right sided infiltrate (similar to last admission in May) - Procalcitonin < 0.10 - 8/16> Metapneumovirus positive  - antibiotics stopped  - cont Oxygen, Guaifenesin / Dextromethorphan  and Tussionex- he is allergic to Codeine.  - pulse ox 83% on room air again today - added Mucomyst  nebs and chest PT - confirmed code status with him - he is a full code  Active Problems: Chronic HFrEF - Echo done 07/12/2023 showing EF 40 to 45%, grade 1 diastolic dysfunction, trivial mitral regurgitation.  - holding IV Lasix  as he does not appear to have pulmonary edema - urine is concentrated   - cont ARB and Toprol    CAD S/P percutaneous coronary angioplasty - troponin 51, likely due to demand  ischemia  Dehydration - Start IV fluids today as urine remains concentrated and urine output is dropping-normal saline at 60 cc an hour ordered - Continuous pulse ox also ordered  Pedal edema - Ankle edema noted again today - Elevated lower extremities myself-TED hose ordered  HTN - hold Amlodipine  for now as BP has been either normal or low    Code Status: Full Code Total time on patient care: 35 min DVT prophylaxis:  Place TED hose Start: 01/10/24 1026 enoxaparin  (LOVENOX ) injection 40 mg Start: 01/08/24 1000   Objective:   Vitals:   01/10/24 0507 01/10/24 0907 01/10/24 1041 01/10/24 1351  BP: (!) 118/98  124/86 100/81  Pulse: 92  81 92  Resp:    20  Temp: 98 F (36.7 C)   98.1 F (36.7 C)  TempSrc: Oral   Oral  SpO2: 100% 97%  96%  Weight:      Height:       Filed Weights   01/08/24 0600 01/09/24 0536 01/10/24 0500  Weight: 95.1 kg 100.4 kg 100.8 kg   Exam: General exam: Appears comfortable  HEENT: oral mucosa moist Respiratory system: rhonchi throughout b/l lung fields, tachypnea and cough- able to speak in full sentences Cardiovascular system: S1 & S2 heard  Gastrointestinal system: Abdomen soft, non-tender, nondistended. Normal bowel sounds   Extremities: No cyanosis, clubbing or edema Psychiatry:  Mood & affect appropriate.    CBC: Recent Labs  Lab 01/07/24 2154 01/07/24 2330 01/10/24 0445  WBC 6.2  --  7.0  HGB 11.0* 11.6*  10.8*  HCT 34.6* 34.0* 35.2*  MCV 92.0  --  93.9  PLT 320  --  264   Basic Metabolic Panel: Recent Labs  Lab 01/07/24 2154 01/07/24 2330 01/09/24 0530 01/09/24 1436  NA 139 139 136 135  K 3.6 3.4* 3.6 3.6  CL 101  --  97* 96*  CO2 25  --  26 27  GLUCOSE 122*  --  143* 128*  BUN 21  --  22 23  CREATININE 0.76  --  0.61 0.68  CALCIUM  8.8*  --  8.7* 8.6*     Scheduled Meds:  acetylcysteine   600 mg Inhalation BID   albuterol   2.5 mg Nebulization BID   aspirin  EC  81 mg Oral Daily   benzonatate   200 mg Oral TID    dextromethorphan -guaiFENesin   1 tablet Oral BID   enoxaparin  (LOVENOX ) injection  40 mg Subcutaneous Q24H   irbesartan   300 mg Oral Daily   metoprolol  succinate  12.5 mg Oral Daily   rosuvastatin   20 mg Oral Daily   tamsulosin   0.4 mg Oral QPC supper    Imaging and lab data personally reviewed   Author: Harmony Sandell  01/10/2024 7:01 PM  To contact [facility]>   Check the care team in [facility] and look for the attending/consulting TR[facility] provider listed  Log into [email or web address] and use [place]'s universal password   Go to> [facility]  and find provider  If you still have difficulty reaching the provider, please page the [facility] (Director on Call) for the Hospitalists listed on amion

## 2024-01-10 NOTE — Progress Notes (Signed)
 Heart Failure Navigator Progress Note  Assessed for Heart & Vascular TOC clinic readiness.  Patient does not meet criteria due to patient has a scheduled [facility] appointment on 01/19/2024. No HF TOC. .   Navigator will sign off at this time.   Stephane Haddock, BSN, Scientist, clinical (histocompatibility and immunogenetics) Only

## 2024-01-11 ENCOUNTER — Inpatient Hospital Stay (HOSPITAL_COMMUNITY)

## 2024-01-11 DIAGNOSIS — I509 Heart failure, unspecified: Secondary | ICD-10-CM | POA: Diagnosis not present

## 2024-01-11 LAB — BASIC METABOLIC PANEL WITH GFR
Anion gap: 8 (ref 5–15)
BUN: 18 mg/dL (ref 8–23)
CO2: 28 mmol/L (ref 22–32)
Calcium: 8.3 mg/dL — ABNORMAL LOW (ref 8.9–10.3)
Chloride: 98 mmol/L (ref 98–111)
Creatinine, Ser: 0.51 mg/dL — ABNORMAL LOW (ref 0.61–1.24)
GFR, Estimated: >60 mL/min (ref >60)
Glucose, Bld: 121 mg/dL — ABNORMAL HIGH (ref 70–99)
Potassium: 4 mmol/L (ref 3.5–5.1)
Sodium: 134 mmol/L — ABNORMAL LOW (ref 135–145)

## 2024-01-11 LAB — BRAIN NATRIURETIC PEPTIDE: B Natriuretic Peptide: 842.8 pg/mL — ABNORMAL HIGH (ref 0.0–100.0)

## 2024-01-11 MED ORDER — ORAL CARE MOUTH RINSE: 15.0000 mL | OROMUCOSAL | Status: DC | PRN

## 2024-01-11 MED ORDER — FUROSEMIDE 10 MG/ML IJ SOLN
40.0000 mg | Freq: Once | INTRAMUSCULAR | Status: AC
  Administered 2024-01-11: 40 mg via INTRAVENOUS
  Filled 2024-01-11: qty 4

## 2024-01-11 NOTE — Progress Notes (Signed)
 [facility] Progress Note  Patient: Chase Combs     FMW:[id number]  DOA: 01/07/2024   PCP: Wonda Worth SQUIBB, PA       Brief hospital course: Chase Combs is a 78 y.o. 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  He was admitted to the hospital 5/17-10/27/2023 for opioid induced ileus versus SBO, possible colitis, and aspiration pneumonia.   Patient is presenting with a chief complaint of shortness of breath.  Reporting 3-day history of shortness of breath, cough, and rhinorrhea.  Also reporting swelling of his legs for the past 3 months which has gotten progressively worse.  He does not take any diuretics at home.  He is reporting mild substernal chest pressure unclear when this started.  Denies vomiting, abdominal pain, or diarrhea.  Over course of hospitalization, competed abx for PNA but continues to be SOB. Low UOP so lasix  was held. 01/11/24 elevated BNP, rales,   Subjective:  Cough is about same as yesterday.  He continues to have hemoptysis  / blood streaked sputum.  Continues to be short of breath. Lower extremity edema some better w/ compression hose in place   Assessment and Plan:  Pneumonia due to metapneumovirus with sepsis cute hypoxemic respiratory failure   - right sided infiltrate (similar to last admission in May) - Procalcitonin < 0.10 - 8/16> Metapneumovirus positive  - antibiotics stopped  - cont Oxygen, Guaifenesin / Dextromethorphan  and Tussionex- he is allergic to Codeine.  - pulse ox 83% on room air again today - added Mucomyst  nebs and chest PT - confirmed code status with him - he is a full code  Acute on Chronic HFrEF - Echo done 07/12/2023 showing EF 40 to 45%, grade 1 diastolic dysfunction, trivial mitral  regurgitation.  - held IV Lasix  as he did not appear to have pulmonary edema - urine is concentrated, however I suspect instersitital/pulmaonry/pleural fluid overload and gave lasix  today given rales and edema and not improving - CXR read pending but on personal review certainly edema and question effusion - CT chest to confirm in case needs thoracentesis  - 1 dose Lasix , monitor response  - low threshold for cardiology consult / repeat Echo  - cont ARB and Toprol    CAD S/P percutaneous coronary angioplasty - troponin 51, likely due to demand ischemia  HTN - hold Amlodipine  for now as BP has been either normal or low      Code Status: Full Code DVT prophylaxis:  Place TED hose Start: 01/10/24 1026 enoxaparin  (LOVENOX ) injection 40 mg Start: 01/08/24 1000   Objective:   Vitals:   01/11/24 0437 01/11/24 0734 01/11/24 0942 01/11/24 1313  BP: 133/89  (!) 130/90 117/77  Pulse: 97  (!) 106 97  Resp: (!) 21  20   Temp: 97.9 F (36.6 C)   97.9 F (36.6 C)  TempSrc: Oral   Oral  SpO2: 99% 93% 92% 94%  Weight:      Height:       Filed Weights   01/09/24 0536 01/10/24 0500 01/11/24 0434  Weight: 100.4 kg 100.8 kg 101.3 kg   Exam: General exam: Appears comfortable  HEENT: oral mucosa moist Respiratory system: rales throughout b/l lung fields - able to speak in full sentences Cardiovascular system: S1 & S2 heard  Gastrointestinal system: Abdomen  soft, non-tender, nondistended. Normal bowel sounds   Extremities: No cyanosis, (+)1-2 edema Psychiatry:  Mood & affect appropriate.    CBC: Recent Labs  Lab 01/07/24 2154 01/07/24 2330 01/10/24 0445  WBC 6.2  --  7.0  HGB 11.0* 11.6* 10.8*  HCT 34.6* 34.0* 35.2*  MCV 92.0  --  93.9  PLT 320  --  264   Basic Metabolic Panel: Recent Labs  Lab 01/07/24 2154 01/07/24 2330 01/09/24 0530 01/09/24 1436 01/11/24 0539  NA 139 139 136 135 134*  K 3.6 3.4* 3.6 3.6 4.0  CL 101  --  97* 96* 98  CO2 25  --  26 27 28   GLUCOSE 122*   --  143* 128* 121*  BUN 21  --  22 23 18   CREATININE 0.76  --  0.61 0.68 0.51*  CALCIUM  8.8*  --  8.7* 8.6* 8.3*     Scheduled Meds:  albuterol   2.5 mg Nebulization BID   aspirin  EC  81 mg Oral Daily   benzonatate   200 mg Oral TID   dextromethorphan -guaiFENesin   1 tablet Oral BID   enoxaparin  (LOVENOX ) injection  40 mg Subcutaneous Q24H   furosemide   40 mg Intravenous Once   irbesartan   300 mg Oral Daily   metoprolol  succinate  12.5 mg Oral Daily   rosuvastatin   20 mg Oral Daily   tamsulosin   0.4 mg Oral QPC supper    Imaging and lab data personally reviewed   Author: Laneta Blunt  01/11/2024 3:09 PM  To contact [facility]>   Check the care team in [facility] and look for the attending/consulting TRH provider listed  Log into [email or web address] and use [place]'s universal password   Go to> [facility]  and find provider  If you still have difficulty reaching the provider, please page the [facility] (Director on Call) for the Hospitalists listed on amion

## 2024-01-12 ENCOUNTER — Inpatient Hospital Stay (HOSPITAL_COMMUNITY)

## 2024-01-12 DIAGNOSIS — J9601 Acute respiratory failure with hypoxia: Secondary | ICD-10-CM

## 2024-01-12 DIAGNOSIS — J189 Pneumonia, unspecified organism: Secondary | ICD-10-CM | POA: Diagnosis not present

## 2024-01-12 DIAGNOSIS — Z9861 Coronary angioplasty status: Secondary | ICD-10-CM

## 2024-01-12 DIAGNOSIS — I509 Heart failure, unspecified: Secondary | ICD-10-CM | POA: Diagnosis not present

## 2024-01-12 DIAGNOSIS — I2510 Atherosclerotic heart disease of native coronary artery without angina pectoris: Secondary | ICD-10-CM | POA: Diagnosis not present

## 2024-01-12 LAB — HEPATIC FUNCTION PANEL
ALT: 41 U/L (ref 0–44)
AST: 38 U/L (ref 15–41)
Albumin: 2.9 g/dL — ABNORMAL LOW (ref 3.5–5.0)
Alkaline Phosphatase: 55 U/L (ref 38–126)
Bilirubin, Direct: 0.1 mg/dL (ref 0.0–0.2)
Indirect Bilirubin: 0.5 mg/dL (ref 0.3–0.9)
Total Bilirubin: 0.6 mg/dL (ref 0.0–1.2)
Total Protein: 5.7 g/dL — ABNORMAL LOW (ref 6.5–8.1)

## 2024-01-12 LAB — BASIC METABOLIC PANEL WITH GFR
Anion gap: 9 (ref 5–15)
BUN: 15 mg/dL (ref 8–23)
CO2: 28 mmol/L (ref 22–32)
Calcium: 8.2 mg/dL — ABNORMAL LOW (ref 8.9–10.3)
Chloride: 95 mmol/L — ABNORMAL LOW (ref 98–111)
Creatinine, Ser: 0.48 mg/dL — ABNORMAL LOW (ref 0.61–1.24)
GFR, Estimated: >60 mL/min (ref >60)
Glucose, Bld: 145 mg/dL — ABNORMAL HIGH (ref 70–99)
Potassium: 3.7 mmol/L (ref 3.5–5.1)
Sodium: 132 mmol/L — ABNORMAL LOW (ref 135–145)

## 2024-01-12 LAB — CBC WITH DIFFERENTIAL/PLATELET
Abs Immature Granulocytes: 0.02 K/uL (ref 0.00–0.07)
Basophils Absolute: 0 K/uL (ref 0.0–0.1)
Basophils Relative: 0 %
Eosinophils Absolute: 0 K/uL (ref 0.0–0.5)
Eosinophils Relative: 0 %
HCT: 35.5 % — ABNORMAL LOW (ref 39.0–52.0)
Hemoglobin: 10.9 g/dL — ABNORMAL LOW (ref 13.0–17.0)
Immature Granulocytes: 0 %
Lymphocytes Relative: 10 %
Lymphs Abs: 0.7 K/uL (ref 0.7–4.0)
MCH: 29.1 pg (ref 26.0–34.0)
MCHC: 30.7 g/dL (ref 30.0–36.0)
MCV: 94.7 fL (ref 80.0–100.0)
Monocytes Absolute: 0.5 K/uL (ref 0.1–1.0)
Monocytes Relative: 7 %
Neutro Abs: 6 K/uL (ref 1.7–7.7)
Neutrophils Relative %: 83 %
Platelets: 267 K/uL (ref 150–400)
RBC: 3.75 MIL/uL — ABNORMAL LOW (ref 4.22–5.81)
RDW: 15.9 % — ABNORMAL HIGH (ref 11.5–15.5)
WBC: 7.3 K/uL (ref 4.0–10.5)
nRBC: 0 % (ref 0.0–0.2)

## 2024-01-12 LAB — PHOSPHORUS: Phosphorus: 3.6 mg/dL (ref 2.5–4.6)

## 2024-01-12 LAB — MAGNESIUM: Magnesium: 2 mg/dL (ref 1.7–2.4)

## 2024-01-12 MED ORDER — IPRATROPIUM BROMIDE 0.02 % IN SOLN
0.5000 mg | Freq: Three times a day (TID) | RESPIRATORY_TRACT | Status: DC
  Administered 2024-01-13 – 2024-01-19 (×18): 0.5 mg via RESPIRATORY_TRACT
  Filled 2024-01-12 (×18): qty 2.5

## 2024-01-12 MED ORDER — BUDESONIDE 0.25 MG/2ML IN SUSP
0.2500 mg | Freq: Two times a day (BID) | RESPIRATORY_TRACT | Status: DC
  Administered 2024-01-12 – 2024-01-19 (×15): 0.25 mg via RESPIRATORY_TRACT
  Filled 2024-01-12 (×15): qty 2

## 2024-01-12 MED ORDER — IPRATROPIUM BROMIDE 0.02 % IN SOLN
0.5000 mg | Freq: Four times a day (QID) | RESPIRATORY_TRACT | Status: DC
  Administered 2024-01-12 (×3): 0.5 mg via RESPIRATORY_TRACT
  Filled 2024-01-12 (×3): qty 2.5

## 2024-01-12 MED ORDER — LEVALBUTEROL HCL 0.63 MG/3ML IN NEBU
0.6300 mg | INHALATION_SOLUTION | Freq: Three times a day (TID) | RESPIRATORY_TRACT | Status: DC
  Administered 2024-01-13 – 2024-01-18 (×16): 0.63 mg via RESPIRATORY_TRACT
  Filled 2024-01-12 (×16): qty 3

## 2024-01-12 MED ORDER — LEVALBUTEROL HCL 0.63 MG/3ML IN NEBU
0.6300 mg | INHALATION_SOLUTION | Freq: Four times a day (QID) | RESPIRATORY_TRACT | Status: DC
  Administered 2024-01-12 (×3): 0.63 mg via RESPIRATORY_TRACT
  Filled 2024-01-12 (×3): qty 3

## 2024-01-12 MED ORDER — FUROSEMIDE 10 MG/ML IJ SOLN
40.0000 mg | Freq: Once | INTRAMUSCULAR | Status: AC
Start: 2024-01-12 — End: 2024-01-12
  Administered 2024-01-12: 40 mg via INTRAVENOUS
  Filled 2024-01-12: qty 4

## 2024-01-12 MED ORDER — ARFORMOTEROL TARTRATE 15 MCG/2ML IN NEBU
15.0000 ug | INHALATION_SOLUTION | Freq: Two times a day (BID) | RESPIRATORY_TRACT | Status: DC
  Administered 2024-01-12 – 2024-01-19 (×15): 15 ug via RESPIRATORY_TRACT
  Filled 2024-01-12 (×15): qty 2

## 2024-01-12 MED ORDER — METHYLPREDNISOLONE SODIUM SUCC 125 MG IJ SOLR
60.0000 mg | Freq: Two times a day (BID) | INTRAMUSCULAR | Status: DC
  Administered 2024-01-12 – 2024-01-16 (×9): 60 mg via INTRAVENOUS
  Filled 2024-01-12 (×9): qty 2

## 2024-01-12 MED ORDER — ALBUTEROL SULFATE (2.5 MG/3ML) 0.083% IN NEBU: 2.5000 mg | INHALATION_SOLUTION | RESPIRATORY_TRACT | Status: DC | PRN

## 2024-01-12 MED ORDER — GUAIFENESIN ER 600 MG PO TB12
1200.0000 mg | ORAL_TABLET | Freq: Two times a day (BID) | ORAL | Status: DC
  Administered 2024-01-12 – 2024-01-19 (×14): 1200 mg via ORAL
  Filled 2024-01-12 (×15): qty 2

## 2024-01-12 NOTE — Hospital Course (Addendum)
 Chase Combs is a 78 y.o. 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  He was admitted to the hospital 5/17-10/27/2023 for opioid induced ileus versus SBO, possible colitis, and aspiration pneumonia.   Patient is presenting with a chief complaint of shortness of breath.  Reporting 3-day history of shortness of breath, cough, and rhinorrhea.  Also was reporting swelling of his legs for the past 3 months which has gotten progressively worse.  He does not take any diuretics at home.  He is reporting mild substernal chest pressure unclear when this started.  Denies vomiting, abdominal pain, or diarrhea.   Over course of hospitalization, He was found to have MetaPneumovirus but continued to be SOB. Lasix  was resumed and now he is on BID IV 40 mg.  Respiratory medications adjustments have been made. Repeat ECHO was done and showed worsening LVEF. Cardiology consulted and have adjusted some of his medications and he is now on amiodarone  and Entresto .  Plan is for RIGHT and LEFT heart cath on Monday, 01/17/2024 in the afternoon.  Assessment and Plan:  Pneumonia due to metapneumovirus with sepsis Acute Hypoxemic respiratory failure  in the setting of below -Right sided infiltrate (similar to last admission in May) - Procalcitonin < 0.10 - 8/16> Metapneumovirus positive  - antibiotics stopped  -Continue Oxygen, Guaifenesin / Dextromethorphan  and Tussionex- he is allergic to Codeine.  - added Mucomyst  nebs and chest PT; adjusted breathing treatments and will place on Xopenex , Atrovent , Brovana  and budesonide ; will also give him IV Solu-Medrol  60 mg every 12h and wean to 40 mg q12h given his improvement  - Does not wear oxygen at home will need to continue to monitor respiratory  status carefully and continue with flutter valve, incentive spirometry and guaifenesin  1200 mg twice daily -Repeat CXR this AM done and showed Similar patchy airspace disease in the right lung base with progressive clearing in the right upper lung zone. Small left pleural effusion with streaky atelectasis in the left lung base. -CT scan without contrast done yesterday and showed Airspace disease throughout the right lung with some improvement in the right upper lobe, unchanged in the right middle lobe and right lower lobe. Increasing airspace disease in the left lower lobe. Findings compatible with multifocal pneumonia. Small left pleural effusion and trace right pleural effusion, stable since prior study. Cardiomegaly, three-vessel coronary artery disease. Aortic Atherosclerosis -CTA PE Protocol as below ruled out PE -Obtained SLP evaluation and they are recommending regular diet with thin liquids.   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  -Checked BNP and went from 842.8 -> 1,747.3. Repeat Echocardiogram showed LVEF of 25-30% with severely decreased Fxn, LV Global Hypokinesis, and LVDP consistent w/ Grade 1 DD and RV was moderately reduced with Severely elevated Pulmonary Artery Systolic Pressure. -Checked CTA Chest PE protocol to assess Decreased RV Fxn to r/o PE and showed no evidence of significant pulmonary embolus but did show persistent diffuse right lung infiltrates with mild improvement since the prior study as well as persistent left pleural effusion and bilateral basilar consolidation or atelectasis which was similar.  Patient continues to have cardiac enlargement and aortic atherosclerosis. -Continue  Diuresis w/ IV Furosemide  40 mg BID  -Patient had a nuclear stress test on 07/12/23 that was reassuring and  negative for ischemia -Cardiology was consulted for further evaluation and Recc's and there are scheduling the patient for a R and L heart cath on Monday 01/17/24 after diuresis and starting Entresto  low-dose -Irbesartan  300 mg po daily changed to Entresto  24-26 mg/tab. 1 tab p.o. twice daily.  Continue Toprol  12.5 mg po daily; also continue with aspirin  81 mg p.o. daily and Rosuvastatin  20 mg p.o. daily -Troponin 51, likely due to demand ischemia -Strict I's and O's and daily weights;  Intake/Output Summary (Last 24 hours) at 01/16/2024 1325 Last data filed at 01/16/2024 1000 Gross per 24 hour  Intake --  Output 3350 ml  Net -3350 ml  -CXR this AM showed Slight increase in right perihilar and bibasilar airspace disease . -Will need Ambulatory home O2 screen and Repeat CXR prior to discharge  NSVT, PVCs and PACs: Continue w/ Metoprolol  Succinate 25 mg po Daily and correct electrolytes as needed. Patient had noted ectopy on telemetry with PVCs and NSVT.  Cardiology added low-dose Amiodarone  200 mg p.o. daily as well. CTM on Telemetry   Essential HTN: Held Amlodipine  for now as BP has been either normal or low; he was continued on Metoprolol  Succinate 25 mg p.o. daily; Irbesartan  300 mg daily is now being changed to a Sacubitril -Valsartan  24-26 mg/tab. 1 tab p.o. twice daily.  Continue monitor blood pressures per protocol.  Last blood pressure reading was 129/53.  Hyperlipidemia: C/w Rosuvastatin  20 mg po daily but may need to hold if LFTs continue to worsen  Elevated ALT: Mild and likely 2/2 to Hepatic Congestion from Volume Overload vs Steroid Demargination. ALT went from 35 -> 50 -> 60. CTM and Trend and if necessary will obtain RUQ U/S and Acute Hepatitis panel. Repeat CMP in the AM  BPH: C/w Tamsulosin  0.4 mg po Daily    Normocytic Anemia: Hgb/Hct Trend has been fairly stable. Last Hgb/Hct was 11.5/37.1 Checked Anemia Panel noted an iron level of 31, UIBC of 308, TIBC of 339, saturation  ration of 9%, ferritin level 113, folate level 14.3 and vitamin B12 1019. CTM for S/Sx of Bleeding; No overt bleeding noted. Repeat CBC in the AM  Hypoalbuminemia: Patient's Albumin Lvl is now 2.8. CTM and Trend and repeat CMP in the AM  Overweight: Complicates overall prognosis and care. Estimated body mass index is 28.84 kg/m as calculated from the following:   Height as of this encounter: 6' (1.829 m).   Weight as of this encounter: 96.5 kg. Weight Loss and Dietary Counseling given

## 2024-01-12 NOTE — Evaluation (Addendum)
 Clinical/Bedside Swallow Evaluation Patient Details  Name: Chase Combs MRN: [id number] Date of Birth: [birth year]  Today's Date: 01/12/2024 Time: SLP Start Time (ACUTE ONLY): 1210 SLP Stop Time (ACUTE ONLY): 1250 SLP Time Calculation (min) (ACUTE ONLY): 40 min  Past Medical History:  Past Medical History:  Diagnosis Date   Abdominal hernia    per pt   Arthritis    hands   Benign localized prostatic hyperplasia with lower urinary tract symptoms (LUTS)    CAD (coronary artery disease) 05/2004   cardiologist--- dr anner;  positive stress test , had outpt cardiac cath 05-27-2004 stenosis RI;  06-18-2004 cath w/ PCI and BMS x1 to pRI;   STEMI 02-16-2009  s/p cath w/ PCI thrombectomy for total occluded RCA , BMS x1 to pRCA;  nuclear stress test 03-15-2012 low risk no ischemia but sig inferior-inferolateral infarct , ef 51%   Dyslipidemia    Essential hypertension    Heart murmur    History of COVID-19 05/2020   per pt mild symptoms that resolved   History of ST elevation myocardial infarction (STEMI) 02/16/2009   inferior STEMI   cath w/ pci w/ BMS to pRCA   History of urinary retention    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[facility]) 04/2021   urologist--- dr borden/ radiation oncology-- dr patrcia;  dx 12/ 2022,  Gleason 4+5, PSA 24.6, volume 94.6cc;  plan to start IMRT   OSA (obstructive sleep apnea)    per pt dx approx 2008, no cpap intolerant   PONV (postoperative nausea and vomiting)    Pre-diabetes    S/p bare metal coronary artery stent    01/ 2006  x1 BMS to pRI (CoStar study stent);  and 09/ 2010  x1 BMS to [facility]   Past Surgical History:  Past Surgical History:  Procedure Laterality Date   CARDIAC CATHETERIZATION  05/27/2004   outpatient by dr burnard [facility] cardiology) for positive stress test;  80% stensis pRI   CORONARY ANGIOPLASTY WITH STENT PLACEMENT  06/18/2004   @[facility]  by Dr Lavon;  PCI w/ BMS to pRI  (CoStar study stent - 2.5 x 16)   CORONARY ANGIOPLASTY WITH STENT  PLACEMENT  02/16/2009   @[facility]  by dr verlin;   Inferior STEMI:  PCI w/ thrombectomy total occluded RCA, BMS x1 to pRCA (Vision BMS 3.5 x 23 -> 4.0 mm), residual 40-50% pLAD, ef 45-50%   FLEXIBLE SIGMOIDOSCOPY N/A 10/20/2023   Procedure: SIGMOIDOSCOPY, FLEXIBLE;  Surgeon: Legrand Victory LITTIE DOUGLAS, MD;  Location: [facility];  Service: Gastroenterology;  Laterality: N/A;   GOLD SEED IMPLANT N/A 09/19/2021   Procedure: GOLD SEED IMPLANT;  Surgeon: Cam Morene ORN, MD;  Location: [facility];  Service: Urology;  Laterality: N/A;  ONLY NEEDS 30 MIN FOR ALL   INGUINAL HERNIA REPAIR Right 09/02/1999   @[facility] ;   recurrent repair right inguinal hernia;   previous repair approx 1990s   INGUINAL HERNIA REPAIR Left    1990s   KNEE ARTHROSCOPY W/ MENISCAL REPAIR Right 12/18/2005   @[facility]    LUMBAR DISC SURGERY  1982   L5--S1   SPACE OAR INSTILLATION N/A 09/19/2021   Procedure: SPACE OAR INSTILLATION;  Surgeon: Cam Morene ORN, MD;  Location: [facility];  Service: Urology;  Laterality: N/A;   TOTAL KNEE ARTHROPLASTY Left 10/01/2023   Procedure: ARTHROPLASTY, KNEE, TOTAL;  Surgeon: Kay Kemps, MD;  Location: [facility];  Service: Orthopedics;  Laterality: Left;   HPI:  78 y.o. male with medical history significant of  CAD with history of MI status post coronary stent x 2, HFmrEF, hypertension, hyperlipidemia, history of prostate cancer, OSA with history of CPAP intolerance, prediabetes, history of total left knee arthroplasty on 10/01/2023, chronic anemia.  He was admitted to the hospital 5/17-10/27/2023 for opioid induced ileus versus SBO, possible colitis, and aspiration pneumonia. CXR Patchy airspace opacities throughout the right lung and streaky  left basilar opacities, concerning for multifocal pneumonia. 2. Similar trace left effusion. CT chest irspace disease throughout the right lung with some improvement in the right upper lobe, unchanged in the right middle lobe and right  lower  lobe. Increasing airspace disease in the left lower lobe. Findings compatible with multifocal pneumonia.  CXR similar to May admit per radiologist.  Pt reports he went to [place] with his family for a week and was admitted to the hospital the day he returned.    Assessment / Plan / Recommendation  Clinical Impression  Pt with functional oropharyngeal swallow based on clinical swallow evaluation. CN exam unremarkable except tip of uvula slight deviation to the right.  Pt with slight erythema -of tongue and oral cavity- admits to xerostomia.  Denies any reflux symptoms.  Observed him consuming thin water  3 ounce Yale - not passing due to needing rest break half way through but no s/s of aspiration.   His swallow was functional for peaches and liquids. He politely declined to consume solids offered and reports his food tastes bad causing him to occasionally choke on food.  He reports he ate sausage this am and had to bring it back up after it stuck - pointing to distal pharynx.  States food he expectorates is just the food and not accompanied by frothy secretions.  He denies this being an issue PTA and does not coorelate this to medical issue or oxygen needs.    Suspect pt may be some subtle esophageal issues - given frequent eructation however he denies GERD or any esophageal symptoms.    Recommend continue diet as tolerated with general precautions.  Educated pt to importance of getting protein to help recover as he is mainly only drinking liquids - he is drinking Ensure - vanilla and willing to consume more.  Advised he start intake with liquids due to his severe xerostomia.  With dental brushing, pt coughing and expectorated viscous secretions - reporting he did this to prevent it from going the wrong way.  Advised him that cannot rule out subtle silent aspiration of liquids with his chronic pulmonary disease - to which he verbalized understanding.   Pt has Life Vac (anti-choking apparatus) in his  truck.  Thanks for this consult. SLP Visit Diagnosis: Dysphagia, unspecified (R13.10)    Aspiration Risk    mild   Diet Recommendation Regular;Thin liquid    Liquid Administration via: Cup;Straw Medication Administration: Other (Comment) (as tolerated) Postural Changes: Seated upright at 90 degrees;Remain upright for at least 30 minutes after po intake    Other  Recommendations Oral Care Recommendations: Oral care BID       Swallow Study   General Date of Onset: 01/12/24 HPI: 78 y.o. 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  He was admitted to the hospital 5/17-10/27/2023 for opioid induced ileus versus SBO, possible colitis, and aspiration pneumonia. CXR Patchy airspace opacities throughout the right lung  and streaky  left basilar opacities, concerning for multifocal pneumonia. 2. Similar trace left effusion. CT chest irspace disease throughout the right lung with some improvement in the right upper lobe, unchanged in the right middle lobe and right  lower lobe. Increasing airspace disease in the left lower lobe. Findings compatible with multifocal pneumonia.  CXR similar to May admit per radiologist.  Pt reports he went to [place] with his family for a week and was admitted to the hospital the day he returned. Diet Prior to this Study: Regular;Thin liquids (Level 0) Temperature Spikes Noted: No Respiratory Status: Nasal cannula History of Recent Intubation: No Behavior/Cognition: Alert;Cooperative;Pleasant mood Oral Cavity Assessment: Erythema Oral Care Completed by SLP: No Oral Cavity - Dentition: Adequate natural dentition;Other (Comment);Missing dentition (some missing, pt reports  he is to have implants) Vision: Functional for  self-feeding Self-Feeding Abilities: Able to feed self Patient Positioning: Upright in bed Baseline Vocal Quality: Normal Volitional Cough: Strong Volitional Swallow: Able to elicit    Oral/Motor/Sensory Function Overall Oral Motor/Sensory Function: Within functional limits   Ice Chips Ice chips: Not tested   Thin Liquid Thin Liquid: Within functional limits Presentation: Cup;Self Fed;Straw Other Comments: WFL except when pt extended head up to drink juice from cup of peaches - likely due to opening airway and spilling into open larynx, he didnot pass 3 ounce yale swallow screen due to requiring rest break but demonstrated protective exhalation post-swallow    Nectar Thick Nectar Thick Liquid: Not tested   Honey Thick Honey Thick Liquid: Not tested   Puree Puree: Within functional limits Presentation: Self Fed;Spoon   Solid     Solid: Within functional limits Presentation: Self Fed      Nicolas Emmie Caldron 01/12/2024,1:15 PM  erythema   Madelin POUR, MS [facility] SLP Acute Rehab Services Office [phone]

## 2024-01-12 NOTE — Progress Notes (Signed)
 PROGRESS NOTE    Chase Combs  FMW:[id number] DOB: [birth year] DOA: 01/07/2024 PCP: Wonda Worth SQUIBB, PA   Brief Narrative:  Chase Combs is a 78 y.o. 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  He was admitted to the hospital 5/17-10/27/2023 for opioid induced ileus versus SBO, possible colitis, and aspiration pneumonia.   Patient is presenting with a chief complaint of shortness of breath.  Reporting 3-day history of shortness of breath, cough, and rhinorrhea.  Also reporting swelling of his legs for the past 3 months which has gotten progressively worse.  He does not take any diuretics at home.  He is reporting mild substernal chest pressure unclear when this started.  Denies vomiting, abdominal pain, or diarrhea.   Over course of hospitalization, competed abx for PNA but continues to be SOB. Low UOP so lasix  was held.  Lasix  was resumed now and will give him another dose.  Respiratory medications adjustments have been made  Assessment and Plan:  Pneumonia due to metapneumovirus with sepsis Acute hypoxemic respiratory failure   - right sided infiltrate (similar to last admission in May) - Procalcitonin < 0.10 - 8/16> Metapneumovirus positive  - antibiotics stopped  - cont Oxygen, Guaifenesin / Dextromethorphan  and Tussionex- he is allergic to Codeine.  - pulse ox 83% on room air again today - added Mucomyst  nebs and chest PT; adjusted breathing treatments and will place on Xopenex , Atrovent , Brovana  and budesonide ; will also give him IV Solu-Medrol  60 mg every 12 - Does not wear oxygen at home will need to continue to monitor respiratory status carefully and continue with flutter valve, incentive spirometry and guaifenesin  1200 mg twice daily -Repeat CXR  this AM done and showed Patchy airspace opacities throughout the right lung and streaky left basilar opacities, concerning for multifocal pneumonia.  Similar trace left effusion. Cardiomegaly with central pulmonary vascular congestion. -Obtain SLP evaluation and they are recommending regular diet with thin liquids.   Acute on Chronic HFrEF Hx of CAD s/p Coronary Angioplasty - Echo done 07/12/2023 showing EF 40 to 45%, grade 1 diastolic dysfunction, trivial mitral regurgitation.  - Initially held IV Lasix  as he did not appear to have pulmonary edema - urine is concentrated, however it was suspected he had instersitital/pulmaonry/pleural fluid overload and because of this he was given IV Lasix  for his rales and edema since he is not improving - CXR as above - CT chest to confirm in case needs thoracentesis  - Given a dose of IV Lasix  yesterday and repeated again today, monitor response  - low threshold for cardiology consult / repeat Echo  - cont ARB and Toprol ; also continue with aspirin  81 mg p.o. daily and rosuvastatin  20 mg p.o. daily - troponin 51, likely due to demand ischemia -Strict I's and O's and daily weights; will need repeat chest x-ray in the a.m. and an amatory home O2 screen prior to discharge   Essential HTN: Held Amlodipine  for now as BP has been either normal or low; he was continued on metoprolol  succinate 12.5 mg p.o. daily as well as irbesartan  300 mg daily.  Continue monitor blood pressures per protocol.  Last blood pressure reading was 123/89.  Hyperlipidemia: C/w Rosuvastatin  20 mg po daily   BPH: C/w Tamsulosin  0.4 mg po Daily    Normocytic Anemia:  Hgb/Hct Trend: Recent Labs  Lab 01/07/24 2154 01/07/24 2330 01/10/24 0445 01/12/24 0517  HGB 11.0* 11.6* 10.8* 10.9*  HCT 34.6* 34.0* 35.2* 35.5*  MCV 92.0  --  93.9 94.7  -Check Anemia Panel in the AM. CTM for S/Sx of Bleeding; No overt bleeding noted. Repeat CBC in the AM  Hypoalbuminemia: Patient's Albumin Lvl is  now 2.9. CTM and Trend and repeat CMP in the AM  Overweight: Complicates overall prognosis and care. Estimated body mass index is 29.78 kg/m as calculated from the following:   Height as of this encounter: 6' (1.829 m).   Weight as of this encounter: 99.6 kg. Weight Loss and Dietary Counseling given   DVT prophylaxis: Place TED hose Start: 01/10/24 1026 enoxaparin  (LOVENOX ) injection 40 mg Start: 01/08/24 1000    Code Status: Full Code Family Communication: No family at bedside  Disposition Plan:  Level of care: Progressive Status is: Inpatient Remains inpatient appropriate because: His further clinical improvement in his respiratory status prior to discharging   Consultants:  None  Procedures:  As delineated as above  Antimicrobials:  Anti-infectives (From admission, onward)    Start     Dose/Rate Route Frequency Ordered Stop   01/08/24 0115  ceFEPIme  (MAXIPIME ) 2 g in sodium chloride  0.9 % 100 mL IVPB  Status:  Discontinued        2 g 200 mL/hr over 30 Minutes Intravenous Every 8 hours 01/08/24 0114 01/08/24 1425   01/08/24 0115  linezolid  (ZYVOX ) IVPB 600 mg  Status:  Discontinued        600 mg 300 mL/hr over 60 Minutes Intravenous Every 12 hours 01/08/24 0114 01/08/24 1425       Subjective: Seen and examined at bedside and states that he still feeling little bit short of breath.  States he does not wear oxygen at home.  No nausea or vomiting.  Denies any lightheadedness or dizziness.  No other concerns or complaints at this time.  Objective: Vitals:   01/12/24 0903 01/12/24 1100 01/12/24 1326 01/12/24 1355  BP:   123/89   Pulse:   62   Resp:   20   Temp:   97.8 F (36.6 C)   TempSrc:   Oral   SpO2: 97% 94% 99% 98%  Weight:      Height:        Intake/Output Summary (Last 24 hours) at 01/12/2024 1844 Last data filed at 01/12/2024 1750 Gross per 24 hour  Intake 280 ml  Output 3650 ml  Net -3370 ml   Filed Weights   01/10/24 0500 01/11/24 0434 01/12/24 0500   Weight: 100.8 kg 101.3 kg 99.6 kg   Examination: Physical Exam:  Constitutional: Overweight elderly Caucasian male in no acute distress appears calm Respiratory: Diminished to auscultation bilaterally with some coarse breath sounds and some slight rhonchi and crackles but no appreciable wheezing or rales. Normal respiratory effort and patient is not tachypenic. No accessory muscle use.  Wearing supplemental oxygen via nasal cannula Cardiovascular: RRR, no murmurs / rubs / gallops. S1 and S2 auscultated.  1+ lower extremity edema Abdomen: Soft, non-tender, distended secondary to body habitus. Bowel sounds positive.  GU: Deferred. Musculoskeletal: No clubbing / cyanosis of digits/nails. No joint deformity upper and lower extremities. Good ROM, no contractures. Normal strength and muscle tone.  Skin: No rashes, lesions, ulcers. No induration; Warm and dry.  Neurologic: CN 2-12 grossly intact with no focal deficits. Romberg sign and cerebellar reflexes not assessed.  Psychiatric: Normal judgment and  insight. Alert and oriented x 3. Normal mood and appropriate affect.   Data Reviewed: I have personally reviewed following labs and imaging studies  CBC: Recent Labs  Lab 01/07/24 2154 01/07/24 2330 01/10/24 0445 01/12/24 0517  WBC 6.2  --  7.0 7.3  NEUTROABS  --   --   --  6.0  HGB 11.0* 11.6* 10.8* 10.9*  HCT 34.6* 34.0* 35.2* 35.5*  MCV 92.0  --  93.9 94.7  PLT 320  --  264 267   Basic Metabolic Panel: Recent Labs  Lab 01/07/24 2154 01/07/24 2330 01/09/24 0530 01/09/24 1436 01/11/24 0539 01/12/24 0517  NA 139 139 136 135 134* 132*  K 3.6 3.4* 3.6 3.6 4.0 3.7  CL 101  --  97* 96* 98 95*  CO2 25  --  26 27 28 28   GLUCOSE 122*  --  143* 128* 121* 145*  BUN 21  --  22 23 18 15   CREATININE 0.76  --  0.61 0.68 0.51* 0.48*  CALCIUM  8.8*  --  8.7* 8.6* 8.3* 8.2*  MG  --   --   --   --   --  2.0  PHOS  --   --   --   --   --  3.6   GFR: Estimated Creatinine Clearance: 96 mL/min  (A) (by C-G formula based on SCr of 0.48 mg/dL (L)). Liver Function Tests: Recent Labs  Lab 01/12/24 0517  AST 38  ALT 41  ALKPHOS 55  BILITOT 0.6  PROT 5.7*  ALBUMIN 2.9*   No results for input(s): LIPASE, AMYLASE in the last 168 hours. No results for input(s): AMMONIA in the last 168 hours. Coagulation Profile: No results for input(s): INR, PROTIME in the last 168 hours. Cardiac Enzymes: No results for input(s): CKTOTAL, CKMB, CKMBINDEX, TROPONINI in the last 168 hours. BNP (last 3 results) Recent Labs    01/07/24 2301  PROBNP 15,245.0*   HbA1C: No results for input(s): HGBA1C in the last 72 hours. CBG: No results for input(s): GLUCAP in the last 168 hours. Lipid Profile: No results for input(s): CHOL, HDL, LDLCALC, TRIG, CHOLHDL, LDLDIRECT in the last 72 hours. Thyroid  Function Tests: No results for input(s): TSH, T4TOTAL, FREET4, T3FREE, THYROIDAB in the last 72 hours. Anemia Panel: No results for input(s): VITAMINB12, FOLATE, FERRITIN, TIBC, IRON, RETICCTPCT in the last 72 hours. Sepsis Labs: Recent Labs  Lab 01/08/24 0126 01/08/24 0435  PROCALCITON  --  <0.10  LATICACIDVEN 1.7  --    Recent Results (from the past 240 hours)  Culture, blood (Routine X 2) w Reflex to ID Panel     Status: None (Preliminary result)   Collection Time: 01/08/24  1:10 AM   Specimen: BLOOD LEFT FOREARM  Result Value Ref Range Status   Specimen Description   Final    BLOOD LEFT FOREARM Performed at [facility], [address] [place], [place] [address]    Special Requests   Final    BOTTLES DRAWN AEROBIC AND ANAEROBIC Blood Culture adequate volume Performed at [facility], [address] [place], [place] [address]    Culture   Final    NO GROWTH 4 DAYS Performed at [facility], [address] [place], [place] [address]    Report Status PENDING  Incomplete  Culture, blood  (Routine X 2) w Reflex to ID Panel     Status: None (Preliminary result)   Collection Time: 01/08/24  1:20 AM   Specimen: BLOOD  LEFT HAND  Result Value Ref Range Status   Specimen Description   Final    BLOOD LEFT HAND Performed at [facility], [address] [place], [place] [address]    Special Requests   Final    BOTTLES DRAWN AEROBIC AND ANAEROBIC Blood Culture results may not be optimal due to an inadequate volume of blood received in culture bottles Performed at [facility], [address], [place], [place] [address]    Culture   Final    NO GROWTH 4 DAYS Performed at [facility], [address], [place], [place] [address]    Report Status PENDING  Incomplete  Resp panel by RT-PCR (RSV, Flu A&B, Covid) Anterior Nasal Swab     Status: None   Collection Time: 01/08/24  1:29 AM   Specimen: Anterior Nasal Swab  Result Value Ref Range Status   SARS Coronavirus 2 by RT PCR NEGATIVE NEGATIVE Final    Comment: (NOTE) SARS-CoV-2 target nucleic acids are NOT DETECTED.  The SARS-CoV-2 RNA is generally detectable in upper respiratory specimens during the acute phase of infection. The lowest concentration of SARS-CoV-2 viral copies this assay can detect is 138 copies/mL. A negative result does not preclude SARS-Cov-2 infection and should not be used as the sole basis for treatment or other patient management decisions. A negative result may occur with  improper specimen collection/handling, submission of specimen other than nasopharyngeal swab, presence of viral mutation(s) within the areas targeted by this assay, and inadequate number of viral copies(<138 copies/mL). A negative result must be combined with clinical observations, patient history, and epidemiological information. The expected result is Negative.  Fact Sheet for Patients:  BloggerCourse.com  Fact Sheet for Healthcare Providers:   SeriousBroker.it  This test is no t yet approved or cleared by the [place]  FDA and  has been authorized for detection and/or diagnosis of SARS-CoV-2 by FDA under an Emergency Use Authorization (EUA). This EUA will remain  in effect (meaning this test can be used) for the duration of the COVID-19 declaration under Section 564(b)(1) of the Act, 21 U.S.C.section 360bbb-3(b)(1), unless the authorization is terminated  or revoked sooner.       Influenza A by PCR NEGATIVE NEGATIVE Final   Influenza B by PCR NEGATIVE NEGATIVE Final    Comment: (NOTE) The Xpert Xpress SARS-CoV-2/FLU/RSV plus assay is intended as an aid in the diagnosis of influenza from Nasopharyngeal swab specimens and should not be used as a sole basis for treatment. Nasal washings and aspirates are unacceptable for Xpert Xpress SARS-CoV-2/FLU/RSV testing.  Fact Sheet for Patients: BloggerCourse.com  Fact Sheet for Healthcare Providers: SeriousBroker.it  This test is not yet approved or cleared by the [place]  FDA and has been authorized for detection and/or diagnosis of SARS-CoV-2 by FDA under an Emergency Use Authorization (EUA). This EUA will remain in effect (meaning this test can be used) for the duration of the COVID-19 declaration under Section 564(b)(1) of the Act, 21 U.S.C. section 360bbb-3(b)(1), unless the authorization is terminated or revoked.     Resp Syncytial Virus by PCR NEGATIVE NEGATIVE Final    Comment: (NOTE) Fact Sheet for Patients: BloggerCourse.com  Fact Sheet for Healthcare Providers: SeriousBroker.it  This test is not yet approved or cleared by the [place]  FDA and has been authorized for detection and/or diagnosis of SARS-CoV-2 by FDA under an Emergency Use Authorization (EUA). This EUA will remain in effect (meaning this test can be used) for  the duration of  the COVID-19 declaration under Section 564(b)(1) of the Act, 21 U.S.C. section 360bbb-3(b)(1), unless the authorization is terminated or revoked.  Performed at [facility], [address], [place], [place] [address]   Respiratory (~20 pathogens) panel by PCR     Status: Abnormal   Collection Time: 01/08/24  9:54 AM   Specimen: Nasopharyngeal Swab; Respiratory  Result Value Ref Range Status   Adenovirus NOT DETECTED NOT DETECTED Final   Coronavirus 229E NOT DETECTED NOT DETECTED Final    Comment: (NOTE) The Coronavirus on the Respiratory Panel, DOES NOT test for the novel  Coronavirus (2019 nCoV)    Coronavirus HKU1 NOT DETECTED NOT DETECTED Final   Coronavirus NL63 NOT DETECTED NOT DETECTED Final   Coronavirus OC43 NOT DETECTED NOT DETECTED Final   Metapneumovirus DETECTED (A) NOT DETECTED Final   Rhinovirus / Enterovirus NOT DETECTED NOT DETECTED Final   Influenza A NOT DETECTED NOT DETECTED Final   Influenza B NOT DETECTED NOT DETECTED Final   Parainfluenza Virus 1 NOT DETECTED NOT DETECTED Final   Parainfluenza Virus 2 NOT DETECTED NOT DETECTED Final   Parainfluenza Virus 3 NOT DETECTED NOT DETECTED Final   Parainfluenza Virus 4 NOT DETECTED NOT DETECTED Final   Respiratory Syncytial Virus NOT DETECTED NOT DETECTED Final   Bordetella pertussis NOT DETECTED NOT DETECTED Final   Bordetella Parapertussis NOT DETECTED NOT DETECTED Final   Chlamydophila pneumoniae NOT DETECTED NOT DETECTED Final   Mycoplasma pneumoniae NOT DETECTED NOT DETECTED Final    Comment: Performed at [facility], [address], [place], [place] [address]    Radiology Studies: DG CHEST PORT 1 VIEW Result Date: 01/12/2024 CLINICAL DATA:  Shortness of breath. EXAM: PORTABLE CHEST 1 VIEW COMPARISON:  01/11/2024. FINDINGS: Cardiomegaly with central pulmonary vascular congestion. Patchy airspace opacities throughout the right lung, most pronounced at the right mid to  lower lung zone. Streaky left basilar opacities. Similar trace left effusion. No pneumothorax. Advanced degenerative arthropathy of the right glenohumeral joint. No acute osseous abnormality. IMPRESSION: 1. Patchy airspace opacities throughout the right lung and streaky left basilar opacities, concerning for multifocal pneumonia. 2. Similar trace left effusion. 3. Cardiomegaly with central pulmonary vascular congestion. Electronically Signed   By: Harrietta Sherry M.D.   On: 01/12/2024 09:53   CT CHEST WO CONTRAST Result Date: 01/12/2024 CLINICAL DATA:  Shortness of breath, cough. Recent pneumonia. Evaluate for pleural effusion. EXAM: CT CHEST WITHOUT CONTRAST TECHNIQUE: Multidetector CT imaging of the chest was performed following the standard protocol without IV contrast. RADIATION DOSE REDUCTION: This exam was performed according to the departmental dose-optimization program which includes automated exposure control, adjustment of the mA and/or kV according to patient size and/or use of iterative reconstruction technique. COMPARISON:  01/08/2024 FINDINGS: Cardiovascular: Heart is mildly enlarged. Diffuse coronary artery and moderate aortic atherosclerosis. No evidence of aortic aneurysm. Mediastinum/Nodes: Small borderline is crash that borderline sized mediastinal lymph nodes, likely reactive. No axillary or visible hilar adenopathy. Trachea and esophagus are unremarkable. Thyroid  unremarkable. Lungs/Pleura: Airspace disease again noted throughout the right lung, slightly improved in the right upper lobe, not significantly changed in the right middle lobe or right lower lobe. Increasing airspace disease in the left lower lobe. Small left pleural effusion is stable since prior study. Trace right pleural effusion, also stable. Upper Abdomen: No acute findings Musculoskeletal: Chest wall soft tissues are unremarkable. No acute bony abnormality. IMPRESSION: Airspace disease throughout the right lung with some  improvement in the right upper lobe, unchanged in the  right middle lobe and right lower lobe. Increasing airspace disease in the left lower lobe. Findings compatible with multifocal pneumonia. Small left pleural effusion and trace right pleural effusion, stable since prior study. Cardiomegaly, three-vessel coronary artery disease. Aortic Atherosclerosis (ICD10-I70.0). Electronically Signed   By: Franky Crease M.D.   On: 01/12/2024 02:35   DG CHEST PORT 1 VIEW Result Date: 01/11/2024 CLINICAL DATA:  Cough.  Pneumonia. EXAM: PORTABLE CHEST 1 VIEW COMPARISON:  Chest radiograph dated 01/07/2024. FINDINGS: Mild eventration of the right hemidiaphragm. Bibasilar atelectasis/scarring. No interval new is knee right perihilar densities which may represent atelectasis or pneumonia. No large pleural effusion. No pneumothorax. Stable cardiac silhouette. No acute osseous pathology. IMPRESSION: Right perihilar atelectasis or pneumonia. Electronically Signed   By: Vanetta Chou M.D.   On: 01/11/2024 15:44     Scheduled Meds:  arformoterol   15 mcg Nebulization BID   aspirin  EC  81 mg Oral Daily   benzonatate   200 mg Oral TID   budesonide  (PULMICORT ) nebulizer solution  0.25 mg Nebulization BID   enoxaparin  (LOVENOX ) injection  40 mg Subcutaneous Q24H   guaiFENesin   1,200 mg Oral BID   ipratropium  0.5 mg Nebulization Q6H   irbesartan   300 mg Oral Daily   levalbuterol   0.63 mg Nebulization Q6H   methylPREDNISolone  (SOLU-MEDROL ) injection  60 mg Intravenous Q12H   metoprolol  succinate  12.5 mg Oral Daily   rosuvastatin   20 mg Oral Daily   tamsulosin   0.4 mg Oral QPC supper   Continuous Infusions:   LOS: 4 days   Alejandro Marker, DO [facility] Available via Epic secure chat 7am-7pm After these hours, please refer to coverage provider listed on [email or web address] 01/12/2024, 6:44 PM

## 2024-01-13 ENCOUNTER — Inpatient Hospital Stay (HOSPITAL_COMMUNITY)

## 2024-01-13 DIAGNOSIS — I5043 Acute on chronic combined systolic (congestive) and diastolic (congestive) heart failure: Secondary | ICD-10-CM | POA: Diagnosis not present

## 2024-01-13 DIAGNOSIS — J189 Pneumonia, unspecified organism: Secondary | ICD-10-CM | POA: Diagnosis not present

## 2024-01-13 DIAGNOSIS — R0609 Other forms of dyspnea: Secondary | ICD-10-CM | POA: Diagnosis not present

## 2024-01-13 DIAGNOSIS — I2510 Atherosclerotic heart disease of native coronary artery without angina pectoris: Secondary | ICD-10-CM | POA: Diagnosis not present

## 2024-01-13 DIAGNOSIS — J9601 Acute respiratory failure with hypoxia: Secondary | ICD-10-CM | POA: Diagnosis not present

## 2024-01-13 LAB — CULTURE, BLOOD (ROUTINE X 2)
Culture: NO GROWTH
Culture: NO GROWTH
Special Requests: ADEQUATE

## 2024-01-13 LAB — COMPREHENSIVE METABOLIC PANEL WITH GFR
ALT: 31 U/L (ref 0–44)
AST: 26 U/L (ref 15–41)
Albumin: 2.8 g/dL — ABNORMAL LOW (ref 3.5–5.0)
Alkaline Phosphatase: 55 U/L (ref 38–126)
Anion gap: 10 (ref 5–15)
BUN: 13 mg/dL (ref 8–23)
CO2: 30 mmol/L (ref 22–32)
Calcium: 8.5 mg/dL — ABNORMAL LOW (ref 8.9–10.3)
Chloride: 95 mmol/L — ABNORMAL LOW (ref 98–111)
Creatinine, Ser: 0.56 mg/dL — ABNORMAL LOW (ref 0.61–1.24)
GFR, Estimated: >60 mL/min (ref >60)
Glucose, Bld: 200 mg/dL — ABNORMAL HIGH (ref 70–99)
Potassium: 4.4 mmol/L (ref 3.5–5.1)
Sodium: 135 mmol/L (ref 135–145)
Total Bilirubin: 0.8 mg/dL (ref 0.0–1.2)
Total Protein: 5.8 g/dL — ABNORMAL LOW (ref 6.5–8.1)

## 2024-01-13 LAB — ECHOCARDIOGRAM COMPLETE
AR max vel: 2.73 cm2
AV Area VTI: 2.86 cm2
AV Area mean vel: 2.83 cm2
AV Mean grad: 4.5 mmHg
AV Peak grad: 8.1 mmHg
Ao pk vel: 1.43 m/s
Area-P 1/2: 6.37 cm2
Calc EF: 33.5 %
Height: 72 in
MV M vel: 4.34 m/s
MV Peak grad: 75.3 mmHg
S' Lateral: 5.6 cm
Single Plane A2C EF: 30.4 %
Single Plane A4C EF: 36.5 %
Weight: 3414.48 [oz_av]

## 2024-01-13 LAB — CBC WITH DIFFERENTIAL/PLATELET
Abs Immature Granulocytes: 0.02 K/uL (ref 0.00–0.07)
Basophils Absolute: 0 K/uL (ref 0.0–0.1)
Basophils Relative: 0 %
Eosinophils Absolute: 0 K/uL (ref 0.0–0.5)
Eosinophils Relative: 0 %
HCT: 37.6 % — ABNORMAL LOW (ref 39.0–52.0)
Hemoglobin: 11 g/dL — ABNORMAL LOW (ref 13.0–17.0)
Immature Granulocytes: 1 %
Lymphocytes Relative: 10 %
Lymphs Abs: 0.4 K/uL — ABNORMAL LOW (ref 0.7–4.0)
MCH: 27.9 pg (ref 26.0–34.0)
MCHC: 29.3 g/dL — ABNORMAL LOW (ref 30.0–36.0)
MCV: 95.4 fL (ref 80.0–100.0)
Monocytes Absolute: 0.1 K/uL (ref 0.1–1.0)
Monocytes Relative: 1 %
Neutro Abs: 3.8 K/uL (ref 1.7–7.7)
Neutrophils Relative %: 88 %
Platelets: 257 K/uL (ref 150–400)
RBC: 3.94 MIL/uL — ABNORMAL LOW (ref 4.22–5.81)
RDW: 15.9 % — ABNORMAL HIGH (ref 11.5–15.5)
WBC: 4.3 K/uL (ref 4.0–10.5)
nRBC: 0 % (ref 0.0–0.2)

## 2024-01-13 LAB — PHOSPHORUS: Phosphorus: 3.9 mg/dL (ref 2.5–4.6)

## 2024-01-13 LAB — MAGNESIUM: Magnesium: 2.1 mg/dL (ref 1.7–2.4)

## 2024-01-13 LAB — BRAIN NATRIURETIC PEPTIDE: B Natriuretic Peptide: 1747.3 pg/mL — ABNORMAL HIGH (ref 0.0–100.0)

## 2024-01-13 MED ORDER — MENTHOL 3 MG MT LOZG
1.0000 | LOZENGE | OROMUCOSAL | Status: DC | PRN
  Administered 2024-01-13: 3 mg via ORAL
  Filled 2024-01-13: qty 9

## 2024-01-13 MED ORDER — FUROSEMIDE 10 MG/ML IJ SOLN
40.0000 mg | Freq: Two times a day (BID) | INTRAMUSCULAR | Status: DC
  Administered 2024-01-13 – 2024-01-17 (×9): 40 mg via INTRAVENOUS
  Filled 2024-01-13 (×9): qty 4

## 2024-01-13 NOTE — Progress Notes (Signed)
*  PRELIMINARY RESULTS* Echocardiogram 2D Echocardiogram has been performed.  Benard FORBES Stallion 01/13/2024, 11:36 AM

## 2024-01-13 NOTE — Progress Notes (Signed)
 PROGRESS NOTE    Chase Combs  FMW:[id number] DOB: 11/23/1945 DOA: 01/07/2024 PCP: Wonda Worth SQUIBB, PA   Brief Narrative:  Chase Combs is a 78 y.o. 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  He was admitted to the hospital 5/17-10/27/2023 for opioid induced ileus versus SBO, possible colitis, and aspiration pneumonia.   Patient is presenting with a chief complaint of shortness of breath.  Reporting 3-day history of shortness of breath, cough, and rhinorrhea.  Also reporting swelling of his legs for the past 3 months which has gotten progressively worse.  He does not take any diuretics at home.  He is reporting mild substernal chest pressure unclear when this started.  Denies vomiting, abdominal pain, or diarrhea.   Over course of hospitalization, competed abx for PNA but continues to be SOB. Low UOP so lasix  was held.  Lasix  was resumed now and will give him another dose.  Respiratory medications adjustments have been made  Assessment and Plan:  Pneumonia due to metapneumovirus with sepsis Acute hypoxemic respiratory failure   - right sided infiltrate (similar to last admission in May) - Procalcitonin < 0.10 - 8/16> Metapneumovirus positive  - antibiotics stopped  - cont Oxygen, Guaifenesin / Dextromethorphan  and Tussionex- he is allergic to Codeine.  - pulse ox 83% on room air again today - added Mucomyst  nebs and chest PT; adjusted breathing treatments and will place on Xopenex , Atrovent , Brovana  and budesonide ; will also give him IV Solu-Medrol  60 mg every 12 - Does not wear oxygen at home will need to continue to monitor respiratory status carefully and continue with flutter valve, incentive spirometry and guaifenesin  1200 mg twice daily -Repeat CXR  this AM done and showed Patchy airspace opacities throughout the right lung and streaky left basilar opacities, concerning for multifocal pneumonia.  Similar trace left effusion. Cardiomegaly with central pulmonary vascular congestion. -CT scan without contrast done yesterday and showed Airspace disease throughout the right lung with some improvement in the right upper lobe, unchanged in the right middle lobe and right lower lobe. Increasing airspace disease in the left lower lobe. Findings compatible with multifocal pneumonia. Small left pleural effusion and trace right pleural effusion, stable since prior study. Cardiomegaly, three-vessel coronary artery disease. Aortic Atherosclerosis -Obtain SLP evaluation and they are recommending regular diet with thin liquids.   Acute on Chronic HFrEF Hx of CAD s/p Coronary Angioplasty - Echo done 07/12/2023 showing EF 40 to 45%, grade 1 diastolic dysfunction, trivial mitral regurgitation.  -Check BNP in the a.m. and repeat echocardiogram - Initially held IV Lasix  as he did not appear to have pulmonary edema - urine is concentrated, however it was suspected he had instersitital/pulmaonry/pleural fluid overload and because of this he was given IV Lasix  for his rales and edema since he is not improving - CXR as above - CT chest to confirm in case needs thoracentesis  - Given a dose of IV Lasix  yesterday and repeated again today, monitor response  - low threshold for cardiology consult / repeat Echo  - cont ARB and Toprol ; also continue with aspirin  81 mg p.o. daily and rosuvastatin  20 mg p.o. daily - troponin 51, likely due to demand ischemia -Strict I's and O's and daily weights; will need repeat chest x-ray in the a.m. and an amatory home O2  screen prior to discharge   Essential HTN: Held Amlodipine  for now as BP has been either normal or low; he was continued on metoprolol  succinate 12.5 mg p.o. daily as well as irbesartan  300 mg daily.  Continue monitor  blood pressures per protocol.  Last blood pressure reading was 123/89.  Hyperlipidemia: C/w Rosuvastatin  20 mg po daily   BPH: C/w Tamsulosin  0.4 mg po Daily    Normocytic Anemia: Hgb/Hct Trend: Recent Labs  Lab 01/07/24 2154 01/07/24 2330 01/10/24 0445 01/12/24 0517 01/13/24 0537  HGB 11.0* 11.6* 10.8* 10.9* 11.0*  HCT 34.6* 34.0* 35.2* 35.5* 37.6*  MCV 92.0  --  93.9 94.7 95.4  -Check Anemia Panel in the AM. CTM for S/Sx of Bleeding; No overt bleeding noted. Repeat CBC in the AM  Hypoalbuminemia: Patient's Albumin Lvl is now 2.8. CTM and Trend and repeat CMP in the AM  Overweight: Complicates overall prognosis and care. Estimated body mass index is 28.94 kg/m as calculated from the following:   Height as of this encounter: 6' (1.829 m).   Weight as of this encounter: 96.8 kg. Weight Loss and Dietary Counseling given   DVT prophylaxis: Place TED hose Start: 01/10/24 1026 enoxaparin  (LOVENOX ) injection 40 mg Start: 01/08/24 1000    Code Status: Full Code Family Communication: No family present @ bedside   Disposition Plan:  Level of care: Progressive Status is: Inpatient Remains inpatient appropriate because: Needs further clinical improvement in his Respiratory Status   Consultants:  Cardiology  Procedures:  As delineated as above  Antimicrobials:  Anti-infectives (From admission, onward)    Start     Dose/Rate Route Frequency Ordered Stop   01/08/24 0115  ceFEPIme  (MAXIPIME ) 2 g in sodium chloride  0.9 % 100 mL IVPB  Status:  Discontinued        2 g 200 mL/hr over 30 Minutes Intravenous Every 8 hours 01/08/24 0114 01/08/24 1425   01/08/24 0115  linezolid  (ZYVOX ) IVPB 600 mg  Status:  Discontinued        600 mg 300 mL/hr over 60 Minutes Intravenous Every 12 hours 01/08/24 0114 01/08/24 1425       Subjective: Seen and examined at bedside and still feels SOB. No nausea or vomiting. Denied any CP. Thinks he is slowly getting better.  Patient is still pretty  fatigued.  No other concerns or complaints this time.  Objective: Vitals:   01/13/24 0500 01/13/24 0820 01/13/24 1452 01/13/24 1507  BP:   (!) 121/109   Pulse:   95   Resp:      Temp:   98.1 F (36.7 C)   TempSrc:   Oral   SpO2:  97% 97% 96%  Weight: 96.8 kg     Height:        Intake/Output Summary (Last 24 hours) at 01/13/2024 1747 Last data filed at 01/13/2024 1740 Gross per 24 hour  Intake 840 ml  Output 2500 ml  Net -1660 ml   Filed Weights   01/11/24 0434 01/12/24 0500 01/13/24 0500  Weight: 101.3 kg 99.6 kg 96.8 kg   Examination: Physical Exam:  Constitutional: WN/WD overweight elderly chronically ill-appearing Caucasian male in no acute distress Respiratory: Diminished to auscultation bilaterally with some coarse breath sounds and does have some slight rhonchi and crackles but no appreciable rales or wheezing. Normal respiratory effort and patient is not tachypenic. No accessory muscle use.  Unlabored breathing but is wearing supplemental oxygen via nasal cannula at 3 L Cardiovascular: RRR, no murmurs / rubs /  gallops. S1 and S2 auscultated.  1+ extremity edema Abdomen: Soft, non-tender, distended secondary body habitus.  Bowel sounds positive.  GU: Deferred. Musculoskeletal: No clubbing / cyanosis of digits/nails. No joint deformity upper and lower extremities.  Skin: No rashes, lesions, ulcers on limited skin evaluation. No induration; Warm and dry.  Neurologic: CN 2-12 grossly intact with no focal deficits. Romberg sign and cerebellar reflexes not assessed.  Psychiatric: Normal judgment and insight. Alert and oriented x 3. Normal mood and appropriate affect.   Data Reviewed: I have personally reviewed following labs and imaging studies  CBC: Recent Labs  Lab 01/07/24 2154 01/07/24 2330 01/10/24 0445 01/12/24 0517 01/13/24 0537  WBC 6.2  --  7.0 7.3 4.3  NEUTROABS  --   --   --  6.0 3.8  HGB 11.0* 11.6* 10.8* 10.9* 11.0*  HCT 34.6* 34.0* 35.2* 35.5* 37.6*   MCV 92.0  --  93.9 94.7 95.4  PLT 320  --  264 267 257   Basic Metabolic Panel: Recent Labs  Lab 01/09/24 0530 01/09/24 1436 01/11/24 0539 01/12/24 0517 01/13/24 0537  NA 136 135 134* 132* 135  K 3.6 3.6 4.0 3.7 4.4  CL 97* 96* 98 95* 95*  CO2 26 27 28 28 30   GLUCOSE 143* 128* 121* 145* 200*  BUN 22 23 18 15 13   CREATININE 0.61 0.68 0.51* 0.48* 0.56*  CALCIUM  8.7* 8.6* 8.3* 8.2* 8.5*  MG  --   --   --  2.0 2.1  PHOS  --   --   --  3.6 3.9   GFR: Estimated Creatinine Clearance: 94.8 mL/min (A) (by C-G formula based on SCr of 0.56 mg/dL (L)). Liver Function Tests: Recent Labs  Lab 01/12/24 0517 01/13/24 0537  AST 38 26  ALT 41 31  ALKPHOS 55 55  BILITOT 0.6 0.8  PROT 5.7* 5.8*  ALBUMIN 2.9* 2.8*   No results for input(s): LIPASE, AMYLASE in the last 168 hours. No results for input(s): AMMONIA in the last 168 hours. Coagulation Profile: No results for input(s): INR, PROTIME in the last 168 hours. Cardiac Enzymes: No results for input(s): CKTOTAL, CKMB, CKMBINDEX, TROPONINI in the last 168 hours. BNP (last 3 results) Recent Labs    01/07/24 2301  PROBNP 15,245.0*   HbA1C: No results for input(s): HGBA1C in the last 72 hours. CBG: No results for input(s): GLUCAP in the last 168 hours. Lipid Profile: No results for input(s): CHOL, HDL, LDLCALC, TRIG, CHOLHDL, LDLDIRECT in the last 72 hours. Thyroid  Function Tests: No results for input(s): TSH, T4TOTAL, FREET4, T3FREE, THYROIDAB in the last 72 hours. Anemia Panel: No results for input(s): VITAMINB12, FOLATE, FERRITIN, TIBC, IRON, RETICCTPCT in the last 72 hours. Sepsis Labs: Recent Labs  Lab 01/08/24 0126 01/08/24 0435  PROCALCITON  --  <0.10  LATICACIDVEN 1.7  --    Recent Results (from the past 240 hours)  Culture, blood (Routine X 2) w Reflex to ID Panel     Status: None   Collection Time: 01/08/24  1:10 AM   Specimen: BLOOD LEFT FOREARM   Result Value Ref Range Status   Specimen Description   Final    BLOOD LEFT FOREARM Performed at [facility], [address], [place], [place] [address]    Special Requests   Final    BOTTLES DRAWN AEROBIC AND ANAEROBIC Blood Culture adequate volume Performed at [facility], [address] [place], [place] [address]    Culture   Final  NO GROWTH 5 DAYS Performed at [facility], [address] [place], [place] [address]    Report Status 01/13/2024 FINAL  Final  Culture, blood (Routine X 2) w Reflex to ID Panel     Status: None   Collection Time: 01/08/24  1:20 AM   Specimen: BLOOD LEFT HAND  Result Value Ref Range Status   Specimen Description   Final    BLOOD LEFT HAND Performed at [facility], [address] [place], [place] [address]    Special Requests   Final    BOTTLES DRAWN AEROBIC AND ANAEROBIC Blood Culture results may not be optimal due to an inadequate volume of blood received in culture bottles Performed at [facility], [address], [place], [place] [address]    Culture   Final    NO GROWTH 5 DAYS Performed at [facility], [address] [place], [place] [address]    Report Status 01/13/2024 FINAL  Final  Resp panel by RT-PCR (RSV, Flu A&B, Covid) Anterior Nasal Swab     Status: None   Collection Time: 01/08/24  1:29 AM   Specimen: Anterior Nasal Swab  Result Value Ref Range Status   SARS Coronavirus 2 by RT PCR NEGATIVE NEGATIVE Final    Comment: (NOTE) SARS-CoV-2 target nucleic acids are NOT DETECTED.  The SARS-CoV-2 RNA is generally detectable in upper respiratory specimens during the acute phase of infection. The lowest concentration of SARS-CoV-2 viral copies this assay can detect is 138 copies/mL. A negative result does not preclude SARS-Cov-2 infection and should not be used as the sole basis for treatment or other patient management decisions. A  negative result may occur with  improper specimen collection/handling, submission of specimen other than nasopharyngeal swab, presence of viral mutation(s) within the areas targeted by this assay, and inadequate number of viral copies(<138 copies/mL). A negative result must be combined with clinical observations, patient history, and epidemiological information. The expected result is Negative.  Fact Sheet for Patients:  BloggerCourse.com  Fact Sheet for Healthcare Providers:  SeriousBroker.it  This test is no t yet approved or cleared by the [place]  FDA and  has been authorized for detection and/or diagnosis of SARS-CoV-2 by FDA under an Emergency Use Authorization (EUA). This EUA will remain  in effect (meaning this test can be used) for the duration of the COVID-19 declaration under Section 564(b)(1) of the Act, 21 U.S.C.section 360bbb-3(b)(1), unless the authorization is terminated  or revoked sooner.       Influenza A by PCR NEGATIVE NEGATIVE Final   Influenza B by PCR NEGATIVE NEGATIVE Final    Comment: (NOTE) The Xpert Xpress SARS-CoV-2/FLU/RSV plus assay is intended as an aid in the diagnosis of influenza from Nasopharyngeal swab specimens and should not be used as a sole basis for treatment. Nasal washings and aspirates are unacceptable for Xpert Xpress SARS-CoV-2/FLU/RSV testing.  Fact Sheet for Patients: BloggerCourse.com  Fact Sheet for Healthcare Providers: SeriousBroker.it  This test is not yet approved or cleared by the [place]  FDA and has been authorized for detection and/or diagnosis of SARS-CoV-2 by FDA under an Emergency Use Authorization (EUA). This EUA will remain in effect (meaning this test can be used) for the duration of the COVID-19 declaration under Section 564(b)(1) of the Act, 21 U.S.C. section 360bbb-3(b)(1), unless the authorization  is terminated or revoked.     Resp Syncytial Virus by PCR NEGATIVE NEGATIVE Final    Comment: (NOTE) Fact Sheet for Patients:  BloggerCourse.com  Fact Sheet for Healthcare Providers: SeriousBroker.it  This test is not yet approved or cleared by the [place]  FDA and has been authorized for detection and/or diagnosis of SARS-CoV-2 by FDA under an Emergency Use Authorization (EUA). This EUA will remain in effect (meaning this test can be used) for the duration of the COVID-19 declaration under Section 564(b)(1) of the Act, 21 U.S.C. section 360bbb-3(b)(1), unless the authorization is terminated or revoked.  Performed at [facility], [address], [place], [place] [address]   Respiratory (~20 pathogens) panel by PCR     Status: Abnormal   Collection Time: 01/08/24  9:54 AM   Specimen: Nasopharyngeal Swab; Respiratory  Result Value Ref Range Status   Adenovirus NOT DETECTED NOT DETECTED Final   Coronavirus 229E NOT DETECTED NOT DETECTED Final    Comment: (NOTE) The Coronavirus on the Respiratory Panel, DOES NOT test for the novel  Coronavirus (2019 nCoV)    Coronavirus HKU1 NOT DETECTED NOT DETECTED Final   Coronavirus NL63 NOT DETECTED NOT DETECTED Final   Coronavirus OC43 NOT DETECTED NOT DETECTED Final   Metapneumovirus DETECTED (A) NOT DETECTED Final   Rhinovirus / Enterovirus NOT DETECTED NOT DETECTED Final   Influenza A NOT DETECTED NOT DETECTED Final   Influenza B NOT DETECTED NOT DETECTED Final   Parainfluenza Virus 1 NOT DETECTED NOT DETECTED Final   Parainfluenza Virus 2 NOT DETECTED NOT DETECTED Final   Parainfluenza Virus 3 NOT DETECTED NOT DETECTED Final   Parainfluenza Virus 4 NOT DETECTED NOT DETECTED Final   Respiratory Syncytial Virus NOT DETECTED NOT DETECTED Final   Bordetella pertussis NOT DETECTED NOT DETECTED Final   Bordetella Parapertussis NOT DETECTED NOT DETECTED Final    Chlamydophila pneumoniae NOT DETECTED NOT DETECTED Final   Mycoplasma pneumoniae NOT DETECTED NOT DETECTED Final    Comment: Performed at [facility], [address], [place], [place] [address]    Radiology Studies: ECHOCARDIOGRAM COMPLETE Result Date: 01/13/2024    ECHOCARDIOGRAM REPORT   Patient Name:   RANJIT ASHURST Date of Exam: 01/13/2024 Medical Rec #:  [id number]      Height:       72.0 in Accession #:    [id number]     Weight:       213.4 lb Date of Birth:  1946/02/11      BSA:          2.190 m Patient Age:    78 years       BP:           134/89 mmHg Patient Gender: M              HR:           104 bpm. Exam Location:  Inpatient Procedure: 2D Echo, Color Doppler and Cardiac Doppler (Both Spectral and Color            Flow Doppler were utilized during procedure). Indications:    Dyspnea  History:        Patient has prior history of Echocardiogram examinations, most                 recent 07/12/2023. CAD.  Sonographer:    Benard Stallion Referring Phys: [id number] Seann Genther LATIF [facility] IMPRESSIONS  1. Left ventricular ejection fraction, by estimation, is 25 to 30%. The left ventricle has severely decreased function. The left ventricle demonstrates global hypokinesis. The left ventricular internal cavity size was moderately dilated. Left ventricular diastolic parameters are consistent  with Grade I diastolic dysfunction (impaired relaxation).  2. Right ventricular systolic function is moderately reduced. The right ventricular size is normal. There is severely elevated pulmonary artery systolic pressure.  3. Left atrial size was moderately dilated.  4. Right atrial size was mildly dilated.  5. The mitral valve is normal in structure. Mild mitral valve regurgitation. No evidence of mitral stenosis.  6. The aortic valve is normal in structure. Aortic valve regurgitation is mild. No aortic stenosis is present.  7. The inferior vena cava is dilated in size with <50% respiratory variability, suggesting right  atrial pressure of 15 mmHg. FINDINGS  Left Ventricle: Left ventricular ejection fraction, by estimation, is 25 to 30%. The left ventricle has severely decreased function. The left ventricle demonstrates global hypokinesis. The left ventricular internal cavity size was moderately dilated. There is no left ventricular hypertrophy. Abnormal (paradoxical) septal motion, consistent with left bundle branch block. Left ventricular diastolic function could not be evaluated due to atrial fibrillation. Left ventricular diastolic parameters are consistent with Grade I diastolic dysfunction (impaired relaxation). Right Ventricle: The right ventricular size is normal. No increase in right ventricular wall thickness. Right ventricular systolic function is moderately reduced. There is severely elevated pulmonary artery systolic pressure. The tricuspid regurgitant velocity is 3.65 m/s, and with an assumed right atrial pressure of 15 mmHg, the estimated right ventricular systolic pressure is 68.3 mmHg. Left Atrium: Left atrial size was moderately dilated. Right Atrium: Right atrial size was mildly dilated. Pericardium: There is no evidence of pericardial effusion. Mitral Valve: The mitral valve is normal in structure. Mild mitral valve regurgitation. No evidence of mitral valve stenosis. Tricuspid Valve: The tricuspid valve is normal in structure. Tricuspid valve regurgitation is mild . No evidence of tricuspid stenosis. Aortic Valve: The aortic valve is normal in structure. Aortic valve regurgitation is mild. No aortic stenosis is present. Aortic valve mean gradient measures 4.5 mmHg. Aortic valve peak gradient measures 8.1 mmHg. Aortic valve area, by VTI measures 2.86 cm. Pulmonic Valve: The pulmonic valve was normal in structure. Pulmonic valve regurgitation is not visualized. No evidence of pulmonic stenosis. Aorta: The aortic root is normal in size and structure. Venous: The inferior vena cava is dilated in size with less than  50% respiratory variability, suggesting right atrial pressure of 15 mmHg. IAS/Shunts: No atrial level shunt detected by color flow Doppler.  LEFT VENTRICLE PLAX 2D LVIDd:         6.90 cm      Diastology LVIDs:         5.60 cm      LV e' medial:    5.77 cm/s LV PW:         1.00 cm      LV E/e' medial:  19.1 LV IVS:        1.00 cm      LV e' lateral:   7.40 cm/s LVOT diam:     2.30 cm      LV E/e' lateral: 14.9 LV SV:         64 LV SV Index:   29 LVOT Area:     4.15 cm  LV Volumes (MOD) LV vol d, MOD A2C: 207.0 ml LV vol d, MOD A4C: 178.0 ml LV vol s, MOD A2C: 144.0 ml LV vol s, MOD A4C: 113.0 ml LV SV MOD A2C:     63.0 ml LV SV MOD A4C:     178.0 ml LV SV MOD BP:      64.2  ml RIGHT VENTRICLE RV Basal diam:  4.50 cm RV Mid diam:    4.00 cm RV S prime:     14.60 cm/s TAPSE (M-mode): 2.5 cm LEFT ATRIUM              Index        RIGHT ATRIUM           Index LA diam:        4.60 cm  2.10 cm/m   RA Area:     16.80 cm LA Vol (A2C):   108.0 ml 49.31 ml/m  RA Volume:   42.50 ml  19.41 ml/m LA Vol (A4C):   82.7 ml  37.76 ml/m LA Biplane Vol: 100.0 ml 45.66 ml/m  AORTIC VALVE AV Area (Vmax):    2.73 cm AV Area (Vmean):   2.83 cm AV Area (VTI):     2.86 cm AV Vmax:           142.50 cm/s AV Vmean:          98.800 cm/s AV VTI:            0.224 m AV Peak Grad:      8.1 mmHg AV Mean Grad:      4.5 mmHg LVOT Vmax:         93.60 cm/s LVOT Vmean:        67.300 cm/s LVOT VTI:          0.154 m LVOT/AV VTI ratio: 0.69  AORTA Ao Root diam: 3.20 cm Ao Asc diam:  3.65 cm MITRAL VALVE                TRICUSPID VALVE MV Area (PHT): 6.37 cm     TR Peak grad:   53.3 mmHg MV Decel Time: 119 msec     TR Vmax:        365.00 cm/s MR Peak grad: 75.3 mmHg MR Vmax:      434.00 cm/s   SHUNTS MV E velocity: 110.00 cm/s  Systemic VTI:  0.15 m MV A velocity: 60.20 cm/s   Systemic Diam: 2.30 cm MV E/A ratio:  1.83 Toribio Fuel MD Electronically signed by Toribio Fuel MD Signature Date/Time: 01/13/2024/11:56:11 AM    Final    DG CHEST PORT  1 VIEW Result Date: 01/13/2024 CLINICAL DATA:  [id number] SOB (shortness of breath) [id number] EXAM: DG CHEST 1V PORT COMPARISON:  01/12/2024 FINDINGS: Elevation of the right hemidiaphragm. Progressive clearing in the right upper lung zone. Patchy airspace opacities in the right lung base, unchanged. Streaky left basilar airspace opacities, likely atelectasis, unchanged. The left costophrenic sulcus is excluded from the field of view. No pneumothorax. No cardiomegaly. Tortuous aorta with aortic atherosclerosis. No acute fracture or destructive lesions. Multilevel thoracic osteophytosis. IMPRESSION: 1. Similar patchy airspace disease in the right lung base with progressive clearing in the right upper lung zone. 2. Small left pleural effusion with streaky atelectasis in the left lung base. Electronically Signed   By: Rogelia Myers M.D.   On: 01/13/2024 08:47   DG CHEST PORT 1 VIEW Result Date: 01/12/2024 CLINICAL DATA:  Shortness of breath. EXAM: PORTABLE CHEST 1 VIEW COMPARISON:  01/11/2024. FINDINGS: Cardiomegaly with central pulmonary vascular congestion. Patchy airspace opacities throughout the right lung, most pronounced at the right mid to lower lung zone. Streaky left basilar opacities. Similar trace left effusion. No pneumothorax. Advanced degenerative arthropathy of the right glenohumeral joint. No acute osseous abnormality. IMPRESSION: 1. Patchy airspace opacities throughout the right lung and  streaky left basilar opacities, concerning for multifocal pneumonia. 2. Similar trace left effusion. 3. Cardiomegaly with central pulmonary vascular congestion. Electronically Signed   By: Harrietta Sherry M.D.   On: 01/12/2024 09:53   CT CHEST WO CONTRAST Result Date: 01/12/2024 CLINICAL DATA:  Shortness of breath, cough. Recent pneumonia. Evaluate for pleural effusion. EXAM: CT CHEST WITHOUT CONTRAST TECHNIQUE: Multidetector CT imaging of the chest was performed following the standard protocol without IV contrast.  RADIATION DOSE REDUCTION: This exam was performed according to the departmental dose-optimization program which includes automated exposure control, adjustment of the mA and/or kV according to patient size and/or use of iterative reconstruction technique. COMPARISON:  01/08/2024 FINDINGS: Cardiovascular: Heart is mildly enlarged. Diffuse coronary artery and moderate aortic atherosclerosis. No evidence of aortic aneurysm. Mediastinum/Nodes: Small borderline is crash that borderline sized mediastinal lymph nodes, likely reactive. No axillary or visible hilar adenopathy. Trachea and esophagus are unremarkable. Thyroid  unremarkable. Lungs/Pleura: Airspace disease again noted throughout the right lung, slightly improved in the right upper lobe, not significantly changed in the right middle lobe or right lower lobe. Increasing airspace disease in the left lower lobe. Small left pleural effusion is stable since prior study. Trace right pleural effusion, also stable. Upper Abdomen: No acute findings Musculoskeletal: Chest wall soft tissues are unremarkable. No acute bony abnormality. IMPRESSION: Airspace disease throughout the right lung with some improvement in the right upper lobe, unchanged in the right middle lobe and right lower lobe. Increasing airspace disease in the left lower lobe. Findings compatible with multifocal pneumonia. Small left pleural effusion and trace right pleural effusion, stable since prior study. Cardiomegaly, three-vessel coronary artery disease. Aortic Atherosclerosis (ICD10-I70.0). Electronically Signed   By: Franky Crease M.D.   On: 01/12/2024 02:35   Scheduled Meds:  arformoterol   15 mcg Nebulization BID   aspirin  EC  81 mg Oral Daily   benzonatate   200 mg Oral TID   budesonide  (PULMICORT ) nebulizer solution  0.25 mg Nebulization BID   enoxaparin  (LOVENOX ) injection  40 mg Subcutaneous Q24H   furosemide   40 mg Intravenous BID   guaiFENesin   1,200 mg Oral BID   ipratropium  0.5 mg  Nebulization TID   irbesartan   300 mg Oral Daily   levalbuterol   0.63 mg Nebulization TID   methylPREDNISolone  (SOLU-MEDROL ) injection  60 mg Intravenous Q12H   metoprolol  succinate  12.5 mg Oral Daily   rosuvastatin   20 mg Oral Daily   tamsulosin   0.4 mg Oral QPC supper   Continuous Infusions:   LOS: 5 days   Alejandro Marker, DO [facility] Available via Epic secure chat 7am-7pm After these hours, please refer to coverage provider listed on [email or web address] 01/13/2024, 5:47 PM

## 2024-01-14 ENCOUNTER — Inpatient Hospital Stay (HOSPITAL_COMMUNITY)

## 2024-01-14 DIAGNOSIS — J9601 Acute respiratory failure with hypoxia: Secondary | ICD-10-CM | POA: Diagnosis not present

## 2024-01-14 DIAGNOSIS — I4719 Other supraventricular tachycardia: Secondary | ICD-10-CM

## 2024-01-14 DIAGNOSIS — I2510 Atherosclerotic heart disease of native coronary artery without angina pectoris: Secondary | ICD-10-CM | POA: Diagnosis not present

## 2024-01-14 DIAGNOSIS — I10 Essential (primary) hypertension: Secondary | ICD-10-CM

## 2024-01-14 DIAGNOSIS — J9691 Respiratory failure, unspecified with hypoxia: Secondary | ICD-10-CM

## 2024-01-14 DIAGNOSIS — J189 Pneumonia, unspecified organism: Secondary | ICD-10-CM | POA: Diagnosis not present

## 2024-01-14 DIAGNOSIS — I5023 Acute on chronic systolic (congestive) heart failure: Secondary | ICD-10-CM | POA: Diagnosis not present

## 2024-01-14 DIAGNOSIS — I5043 Acute on chronic combined systolic (congestive) and diastolic (congestive) heart failure: Secondary | ICD-10-CM | POA: Diagnosis not present

## 2024-01-14 DIAGNOSIS — I493 Ventricular premature depolarization: Secondary | ICD-10-CM | POA: Diagnosis not present

## 2024-01-14 DIAGNOSIS — I255 Ischemic cardiomyopathy: Secondary | ICD-10-CM

## 2024-01-14 DIAGNOSIS — J123 Human metapneumovirus pneumonia: Secondary | ICD-10-CM

## 2024-01-14 DIAGNOSIS — R079 Chest pain, unspecified: Secondary | ICD-10-CM

## 2024-01-14 LAB — FERRITIN: Ferritin: 113 ng/mL (ref 24–336)

## 2024-01-14 LAB — RETICULOCYTES
Immature Retic Fract: 34.6 % — ABNORMAL HIGH (ref 2.3–15.9)
RBC.: 3.87 MIL/uL — ABNORMAL LOW (ref 4.22–5.81)
Retic Count, Absolute: 71.6 K/uL (ref 19.0–186.0)
Retic Ct Pct: 1.9 % (ref 0.4–3.1)

## 2024-01-14 LAB — COMPREHENSIVE METABOLIC PANEL WITH GFR
ALT: 35 U/L (ref 0–44)
AST: 26 U/L (ref 15–41)
Albumin: 3.1 g/dL — ABNORMAL LOW (ref 3.5–5.0)
Alkaline Phosphatase: 59 U/L (ref 38–126)
Anion gap: 12 (ref 5–15)
BUN: 18 mg/dL (ref 8–23)
CO2: 31 mmol/L (ref 22–32)
Calcium: 8.8 mg/dL — ABNORMAL LOW (ref 8.9–10.3)
Chloride: 92 mmol/L — ABNORMAL LOW (ref 98–111)
Creatinine, Ser: 0.66 mg/dL (ref 0.61–1.24)
GFR, Estimated: >60 mL/min (ref >60)
Glucose, Bld: 204 mg/dL — ABNORMAL HIGH (ref 70–99)
Potassium: 3.7 mmol/L (ref 3.5–5.1)
Sodium: 135 mmol/L (ref 135–145)
Total Bilirubin: 0.6 mg/dL (ref 0.0–1.2)
Total Protein: 6.3 g/dL — ABNORMAL LOW (ref 6.5–8.1)

## 2024-01-14 LAB — CBC WITH DIFFERENTIAL/PLATELET
Abs Immature Granulocytes: 0.04 K/uL (ref 0.00–0.07)
Basophils Absolute: 0 K/uL (ref 0.0–0.1)
Basophils Relative: 0 %
Eosinophils Absolute: 0 K/uL (ref 0.0–0.5)
Eosinophils Relative: 0 %
HCT: 37.1 % — ABNORMAL LOW (ref 39.0–52.0)
Hemoglobin: 11.1 g/dL — ABNORMAL LOW (ref 13.0–17.0)
Immature Granulocytes: 1 %
Lymphocytes Relative: 7 %
Lymphs Abs: 0.6 K/uL — ABNORMAL LOW (ref 0.7–4.0)
MCH: 27.9 pg (ref 26.0–34.0)
MCHC: 29.9 g/dL — ABNORMAL LOW (ref 30.0–36.0)
MCV: 93.2 fL (ref 80.0–100.0)
Monocytes Absolute: 0.2 K/uL (ref 0.1–1.0)
Monocytes Relative: 3 %
Neutro Abs: 7.5 K/uL (ref 1.7–7.7)
Neutrophils Relative %: 89 %
Platelets: 312 K/uL (ref 150–400)
RBC: 3.98 MIL/uL — ABNORMAL LOW (ref 4.22–5.81)
RDW: 16 % — ABNORMAL HIGH (ref 11.5–15.5)
WBC: 8.3 K/uL (ref 4.0–10.5)
nRBC: 0 % (ref 0.0–0.2)

## 2024-01-14 LAB — PHOSPHORUS: Phosphorus: 4.4 mg/dL (ref 2.5–4.6)

## 2024-01-14 LAB — MAGNESIUM: Magnesium: 2.1 mg/dL (ref 1.7–2.4)

## 2024-01-14 LAB — VITAMIN B12: Vitamin B-12: 1019 pg/mL — ABNORMAL HIGH (ref 180–914)

## 2024-01-14 LAB — FOLATE: Folate: 14.3 ng/mL (ref >5.9)

## 2024-01-14 LAB — IRON AND TIBC
Iron: 31 ug/dL — ABNORMAL LOW (ref 45–182)
Saturation Ratios: 9 % — ABNORMAL LOW (ref 17.9–39.5)
TIBC: 339 ug/dL (ref 250–450)
UIBC: 308 ug/dL

## 2024-01-14 MED ORDER — AMIODARONE HCL 200 MG PO TABS
200.0000 mg | ORAL_TABLET | Freq: Every day | ORAL | Status: DC
  Administered 2024-01-14 – 2024-01-19 (×5): 200 mg via ORAL
  Filled 2024-01-14 (×6): qty 1

## 2024-01-14 MED ORDER — SACUBITRIL-VALSARTAN 24-26 MG PO TABS
1.0000 | ORAL_TABLET | Freq: Two times a day (BID) | ORAL | Status: DC
  Administered 2024-01-15 – 2024-01-16 (×4): 1 via ORAL
  Filled 2024-01-14 (×5): qty 1

## 2024-01-14 MED ORDER — IOHEXOL 350 MG/ML SOLN
75.0000 mL | Freq: Once | INTRAVENOUS | Status: AC | PRN
  Administered 2024-01-14: 75 mL via INTRAVENOUS

## 2024-01-14 NOTE — Consult Note (Signed)
 Cardiology Consultation   Patient ID: Chase Combs MRN: [id number]; DOB: 1946/05/03  Admit date: 01/07/2024 Date of Consult: 01/14/2024  PCP:  Wonda Worth SQUIBB, PA   [facility] Providers Cardiologist:  Chase Clay, MD      Patient Profile: Chase Combs is a 78 y.o. 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 Sherrill.  History of Present Illness: Chase Combs has a history of CAD that dates back to 2006 with BMS-RCA (2.5 x 16 mm, Dr. 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 Most recent evaluation 06/2023 with nuclear stress test with no evidence of ischemia but reduced LVEF of 31%. Echo on the same day showed LVEF 40-45% with global hypokinesis and no RWMA, grade 1 DD, mild LAE. He has OSA intolerant to CPAP.   He underwent left TKA on 10/01/2023.  He has a history of chronic anemia.  Baseline hemoglobin has been in the 11-12 range.  He was hospitalized May 2025 with opioid-induced ileus versus SBO, possible colitis, and aspiration pneumonia.  He presented back to [facility] ED 01/08/2024 with shortness of breath x 3 days.  He also reported lower extremity swelling, not on standing diuretic at home.  He also reported substernal chest pressure but could not report when this started.  He was hypoxic requiring 4 L .  He was tachycardic, tachypneic, and hypoxemic requiring nasal cannula.  He was treated for pneumonia plus or minus SIRS/sepsis.  CTA chest negative for PE.  Empiric antibiotics per primary.  Procalcitonin negative.  COVID/influenza negative.  Respiratory panel positive for metapneumovirus.  Troponin 65 BNP 843  Echo yesterday with LVEF 25-30% with global hypokinesis, grade 1 DD, moderately reduced RV function, severely elevated PASP,  moderate LAE, mild MR.    Past Medical History:  Diagnosis Date   Abdominal hernia    per pt   Arthritis    hands   Benign localized prostatic hyperplasia with lower urinary tract symptoms (LUTS)    CAD (coronary artery disease) 05/2004   cardiologist--- dr Combs;  positive stress test , had outpt cardiac cath 05-27-2004 stenosis RI;  06-18-2004 cath w/ PCI and BMS x1 to pRI;   STEMI 02-16-2009  s/p cath w/ PCI thrombectomy for total occluded RCA , BMS x1 to pRCA;  nuclear stress test 03-15-2012 low risk no ischemia but sig inferior-inferolateral infarct , ef 51%   Dyslipidemia    Essential hypertension    Heart murmur    History of COVID-19 05/2020   per pt mild symptoms that resolved   History of ST elevation myocardial infarction (STEMI) 02/16/2009   inferior STEMI   cath w/ pci w/ BMS to pRCA   History of urinary retention    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[facility]) 04/2021   urologist--- dr borden/ radiation oncology-- dr patrcia;  dx 12/ 2022,  Gleason 4+5, PSA 24.6, volume 94.6cc;  plan to start IMRT   OSA (obstructive sleep apnea)    per pt dx approx 2008, no cpap intolerant   PONV (postoperative nausea and vomiting)    Pre-diabetes    S/p bare metal coronary artery stent    01/ 2006  x1 BMS to pRI (CoStar study stent);  and 09/ 2010  x1 BMS  to [facility]    Past Surgical History:  Procedure Laterality Date   CARDIAC CATHETERIZATION  05/27/2004   outpatient by dr burnard [facility] cardiology) for positive stress test;  80% stensis pRI   CORONARY ANGIOPLASTY WITH STENT PLACEMENT  06/18/2004   @[facility]  by Dr Lavon;  PCI w/ BMS to pRI  (CoStar study stent - 2.5 x 16)   CORONARY ANGIOPLASTY WITH STENT PLACEMENT  02/16/2009   @[facility]  by dr verlin;   Inferior STEMI:  PCI w/ thrombectomy total occluded RCA, BMS x1 to pRCA (Vision BMS 3.5 x 23 -> 4.0 mm), residual 40-50% pLAD, ef 45-50%   FLEXIBLE SIGMOIDOSCOPY N/A 10/20/2023   Procedure: SIGMOIDOSCOPY, FLEXIBLE;  Surgeon: Legrand Victory LITTIE DOUGLAS, MD;  Location: [facility] END[facility];  Service: Gastroenterology;  Laterality: N/A;   GOLD SEED IMPLANT N/A 09/19/2021   Procedure: GOLD SEED IMPLANT;  Surgeon: Cam Morene ORN, MD;  Location: [facility];  Service: Urology;  Laterality: N/A;  ONLY NEEDS 30 MIN FOR ALL   INGUINAL HERNIA REPAIR Right 09/02/1999   @[facility] ;   recurrent repair right inguinal hernia;   previous repair approx 1990s   INGUINAL HERNIA REPAIR Left    1990s   KNEE ARTHROSCOPY W/ MENISCAL REPAIR Right 12/18/2005   @[facility]    LUMBAR DISC SURGERY  1982   L5--S1   SPACE OAR INSTILLATION N/A 09/19/2021   Procedure: SPACE OAR INSTILLATION;  Surgeon: Cam Morene ORN, MD;  Location: [facility];  Service: Urology;  Laterality: N/A;   TOTAL KNEE ARTHROPLASTY Left 10/01/2023   Procedure: ARTHROPLASTY, KNEE, TOTAL;  Surgeon: Kay Kemps, MD;  Location: [facility] OR[facility];  Service: Orthopedics;  Laterality: Left;     Home Medications:  Prior to Admission medications   Medication Sig Start Date End Date Taking? Authorizing Provider  amLODipine  (NORVASC ) 5 MG tablet Take 5 mg by mouth daily.   Yes [provider]  aspirin  EC 81 MG tablet Take 81 mg by mouth 2 (two) times daily.   Yes [provider]  calcium  carbonate (OSCAL) 1500 (600 Ca) MG TABS tablet Take 600 mg of elemental calcium  by mouth every evening.   Yes [provider]  clotrimazole-betamethasone (LOTRISONE) cream Apply 1 Application topically 2 (two) times daily.   Yes [provider]  cyanocobalamin  (VITAMIN B12) 500 MCG tablet Take 500 mcg by mouth daily.   Yes [provider]  furosemide  (LASIX ) 20 MG tablet Take 20 mg by mouth daily.   Yes [provider]  ibuprofen  (ADVIL ) 100 MG tablet Take 100 mg by mouth every 6 (six) hours as needed for fever.   Yes [provider]  metoprolol  succinate (TOPROL -XL) 25 MG 24 hr tablet Take 0.5 tablets (12.5 mg total) by mouth daily. 06/21/23  Yes  Anner Chase ORN, MD  Multiple Vitamin (MULTIVITAMIN WITH MINERALS) TABS tablet Take 1 tablet by mouth in the morning.   Yes [provider]  Probiotic Product (CULTURELLE PROBIOTICS) CHEW Chew 1 tablet by mouth daily.   Yes [provider]  rosuvastatin  (CRESTOR ) 20 MG tablet TAKE 1 TABLET(20 MG) BY MOUTH DAILY 11/29/23  Yes Anner Chase ORN, MD  tamsulosin  (FLOMAX ) 0.4 MG CAPS capsule Take 1 capsule (0.4 mg total) by mouth daily after supper. 11/17/21  Yes Patrcia Cough, MD  valsartan  (DIOVAN ) 320 MG tablet TAKE 1 TABLET BY MOUTH EVERY DAY 01/14/23  Yes Anner Chase ORN, MD  acetaminophen  (TYLENOL ) 500 MG tablet Take 500 mg by mouth every 6 (six) hours as  needed. Patient not taking: Reported on 01/08/2024    [provider]  amLODipine  (NORVASC ) 10 MG tablet TAKE 1 TABLET BY MOUTH EVERY DAY Patient not taking: Reported on 01/08/2024 01/07/24   Anner Chase ORN, MD  benzonatate  (TESSALON ) 200 MG capsule Take 200 mg by mouth 3 (three) times daily as needed. Patient not taking: Reported on 01/08/2024 01/07/24 01/20/24  [provider]  Cannabidiol  POWD Apply 1 Application topically 3 (three) times daily as needed (knee pain.). CBD cream Patient not taking: Reported on 01/08/2024    [provider]  traMADol  (ULTRAM ) 50 MG tablet Take 1 tablet (50 mg total) by mouth every 6 (six) hours as needed for moderate pain (pain score 4-6) or severe pain (pain score 7-10). Patient not taking: Reported on 01/08/2024 10/27/23   Pokhrel, Laxman, MD    Scheduled Meds:  arformoterol   15 mcg Nebulization BID   aspirin  EC  81 mg Oral Daily   benzonatate   200 mg Oral TID   budesonide  (PULMICORT ) nebulizer solution  0.25 mg Nebulization BID   enoxaparin  (LOVENOX ) injection  40 mg Subcutaneous Q24H   furosemide   40 mg Intravenous BID   guaiFENesin   1,200 mg Oral BID   ipratropium  0.5 mg Nebulization TID   irbesartan   300 mg Oral Daily   levalbuterol   0.63 mg Nebulization TID    methylPREDNISolone  (SOLU-MEDROL ) injection  60 mg Intravenous Q12H   metoprolol  succinate  12.5 mg Oral Daily   rosuvastatin   20 mg Oral Daily   tamsulosin   0.4 mg Oral QPC supper   Continuous Infusions:  PRN Meds: acetaminophen , albuterol , menthol -cetylpyridinium, mouth rinse, traMADol   Allergies:    Allergies  Allergen Reactions   Codeine Nausea And Vomiting   Latex Rash    Oral blisters when used at dentist    Social History:   Social History   Socioeconomic History   Marital status: Married    Spouse name: [name]   Number of children: 3   Years of education: Not on file   Highest education level: Not on file  Occupational History   Not on file  Tobacco Use   Smoking status: Never   Smokeless tobacco: Never  Vaping Use   Vaping status: Never Used  Substance and Sexual Activity   Alcohol use: No    Alcohol/week: 0.0 standard drinks of alcohol   Drug use: Never   Sexual activity: Not on file  Other Topics Concern   Not on file  Social History Narrative   He is married, father of 3, grandfather of 5. Never smoked. Does not drink.    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    He does lots of lifting, pulling with ropes, using upper extremities.       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   Social Drivers of Corporate investment banker Strain: Not on file  Food Insecurity: No Food Insecurity (01/08/2024)   Hunger Vital Sign    Worried About Running Out of Food in the Last Year: Never true    Ran Out of Food in the Last Year: Never true  Transportation Needs: No Transportation Needs (01/08/2024)   PRAPARE - Transportation    Lack of Transportation (Medical): No    Lack  of Transportation (Non-Medical): No  Physical Activity: Not on file  Stress: Not on  file  Social Connections: Socially Integrated (01/08/2024)   Social Connection and Isolation Panel    Frequency of Communication with Friends and Family: More than three times a week    Frequency of Social Gatherings with Friends and Family: Once a week    Attends Religious Services: More than 4 times per year    Active Member of [employer] or Organizations: No    Attends Engineer, structural: More than 4 times per year    Marital Status: Married  Catering manager Violence: Not At Risk (01/08/2024)   Humiliation, Afraid, Rape, and Kick questionnaire    Fear of Current or Ex-Partner: No    Emotionally Abused: No    Physically Abused: No    Sexually Abused: No    Family History:    Family History  Problem Relation Age of Onset   Lung cancer Mother    Colon cancer Neg Hx    Esophageal cancer Neg Hx      ROS:  Please see the history of present illness.   All other ROS reviewed and negative.     Physical Exam/Data: Vitals:   01/13/24 2039 01/14/24 0500 01/14/24 0520 01/14/24 0738  BP: 126/69  (!) 144/95   Pulse: (!) 102  (!) 55   Resp: 18  18   Temp: 98.6 F (37 C)  98.1 F (36.7 C)   TempSrc: Oral  Oral   SpO2: 98%  97% 96%  Weight:  96.5 kg    Height:        Intake/Output Summary (Last 24 hours) at 01/14/2024 1017 Last data filed at 01/14/2024 0841 Gross per 24 hour  Intake 836 ml  Output 3550 ml  Net -2714 ml      01/14/2024    5:00 AM 01/13/2024    5:00 AM 01/12/2024    5:00 AM  Last 3 Weights  Weight (lbs) 212 lb 11.9 oz 213 lb 6.5 oz 219 lb 9.3 oz  Weight (kg) 96.5 kg 96.8 kg 99.6 kg     Body mass index is 28.85 kg/m.   Elderly white male Abnormal lung exam egophony right lung with rhonchi And exp wheezing  Abdomen benign Post right TKR Plus 2 bilateral edema No murmur  EKG:  The EKG was personally reviewed and demonstrates:  ST with HR 116, PACs Telemetry:  Telemetry was personally reviewed and demonstrates:  SR to ST with PVCs and PACs, HR in  the 80-110s  Relevant CV Studies:  Echo 01/13/24:  1. Left ventricular ejection fraction, by estimation, is 25 to 30%. The  left ventricle has severely decreased function. The left ventricle  demonstrates global hypokinesis. The left ventricular internal cavity size  was moderately dilated. Left  ventricular diastolic parameters are consistent with Grade I diastolic  dysfunction (impaired relaxation).   2. Right ventricular systolic function is moderately reduced. The right  ventricular size is normal. There is severely elevated pulmonary artery  systolic pressure.   3. Left atrial size was moderately dilated.   4. Right atrial size was mildly dilated.   5. The mitral valve is normal in structure. Mild mitral valve  regurgitation. No evidence of mitral stenosis.   6. The aortic valve is normal in structure. Aortic valve regurgitation is  mild. No aortic stenosis is present.   7. The inferior vena cava is dilated in size with <50% respiratory  variability, suggesting right atrial pressure of  15 mmHg.   ECHO 07/12/2023: EF 40 to 45%.  Moderate severely dilated LV with global HK.  Only GR 1 DD.  Mild to moderate LA dilation.  Normal RV size and function.  Normal RVP and RAP.  Aortic root measured 40 mm.  Myoview  07/12/2023: Read As High Risk due to presence of fixed Inferior Perfusion Defect with Abnormal Wall Motion Consistent with Prior Infarction-Inferior Scar.  EF estimated at 30-44% (although echo more accurate).  Fixed Inferior Defect with NO ISCHEMIA.  Consistent with known anatomy and prior inferior infarct. => Inferior scar seen on previous Myoview  in 2013.  Laboratory Data: High Sensitivity Troponin:   Recent Labs  Lab 01/08/24 0435  TROPONINIHS 65*     Chemistry Recent Labs  Lab 01/12/24 0517 01/13/24 0537 01/14/24 0526  NA 132* 135 135  K 3.7 4.4 3.7  CL 95* 95* 92*  CO2 28 30 31   GLUCOSE 145* 200* 204*  BUN 15 13 18   CREATININE 0.48* 0.56* 0.66  CALCIUM  8.2* 8.5*  8.8*  MG 2.0 2.1 2.1  GFRNONAA >60 >60 >60  ANIONGAP 9 10 12     Recent Labs  Lab 01/12/24 0517 01/13/24 0537 01/14/24 0526  PROT 5.7* 5.8* 6.3*  ALBUMIN 2.9* 2.8* 3.1*  AST 38 26 26  ALT 41 31 35  ALKPHOS 55 55 59  BILITOT 0.6 0.8 0.6   Lipids No results for input(s): CHOL, TRIG, HDL, LABVLDL, LDLCALC, CHOLHDL in the last 168 hours.  Hematology Recent Labs  Lab 01/12/24 0517 01/13/24 0537 01/14/24 0526  WBC 7.3 4.3 8.3  RBC 3.75* 3.94* 3.98*  3.87*  HGB 10.9* 11.0* 11.1*  HCT 35.5* 37.6* 37.1*  MCV 94.7 95.4 93.2  MCH 29.1 27.9 27.9  MCHC 30.7 29.3* 29.9*  RDW 15.9* 15.9* 16.0*  PLT 267 257 312   Thyroid  No results for input(s): TSH, FREET4 in the last 168 hours.  BNP Recent Labs  Lab 01/07/24 2301 01/11/24 0539 01/13/24 0537  BNP  --  842.8* 1,747.3*  PROBNP 15,245.0*  --   --     DDimer No results for input(s): DDIMER in the last 168 hours.  Radiology/Studies:  DG CHEST PORT 1 VIEW Result Date: 01/14/2024 CLINICAL DATA:  Shortness of breath. EXAM: PORTABLE CHEST 1 VIEW COMPARISON:  01/13/2024 FINDINGS: Stable asymmetric elevation right hemidiaphragm with similar right base streaky opacity compatible with atelectasis or infiltrate. The cardio pericardial silhouette is enlarged. No acute bony abnormality. Degenerative changes noted in the right shoulder. IMPRESSION: Similar right base atelectasis or infiltrate. Electronically Signed   By: Camellia Candle M.D.   On: 01/14/2024 09:21   ECHOCARDIOGRAM COMPLETE Result Date: 01/13/2024    ECHOCARDIOGRAM REPORT   Patient Name:   Chase Combs Date of Exam: 01/13/2024 Medical Rec #:  [id number]      Height:       72.0 in Accession #:    [id number]     Weight:       213.4 lb Date of Birth:  01/20/1946      BSA:          2.190 m Patient Age:    78 years       BP:           134/89 mmHg Patient Gender: M              HR:           104 bpm. Exam Location:  Inpatient Procedure: 2D Echo, Color Doppler and Cardiac  Doppler (Both Spectral and Color            Flow Doppler were utilized during procedure). Indications:    Dyspnea  History:        Patient has prior history of Echocardiogram examinations, most                 recent 07/12/2023. CAD.  Sonographer:    Benard Stallion Referring Phys: [id number] OMAIR LATIF [facility] IMPRESSIONS  1. Left ventricular ejection fraction, by estimation, is 25 to 30%. The left ventricle has severely decreased function. The left ventricle demonstrates global hypokinesis. The left ventricular internal cavity size was moderately dilated. Left ventricular diastolic parameters are consistent with Grade I diastolic dysfunction (impaired relaxation).  2. Right ventricular systolic function is moderately reduced. The right ventricular size is normal. There is severely elevated pulmonary artery systolic pressure.  3. Left atrial size was moderately dilated.  4. Right atrial size was mildly dilated.  5. The mitral valve is normal in structure. Mild mitral valve regurgitation. No evidence of mitral stenosis.  6. The aortic valve is normal in structure. Aortic valve regurgitation is mild. No aortic stenosis is present.  7. The inferior vena cava is dilated in size with <50% respiratory variability, suggesting right atrial pressure of 15 mmHg. FINDINGS  Left Ventricle: Left ventricular ejection fraction, by estimation, is 25 to 30%. The left ventricle has severely decreased function. The left ventricle demonstrates global hypokinesis. The left ventricular internal cavity size was moderately dilated. There is no left ventricular hypertrophy. Abnormal (paradoxical) septal motion, consistent with left bundle branch block. Left ventricular diastolic function could not be evaluated due to atrial fibrillation. Left ventricular diastolic parameters are consistent with Grade I diastolic dysfunction (impaired relaxation). Right Ventricle: The right ventricular size is normal. No increase in right ventricular wall  thickness. Right ventricular systolic function is moderately reduced. There is severely elevated pulmonary artery systolic pressure. The tricuspid regurgitant velocity is 3.65 m/s, and with an assumed right atrial pressure of 15 mmHg, the estimated right ventricular systolic pressure is 68.3 mmHg. Left Atrium: Left atrial size was moderately dilated. Right Atrium: Right atrial size was mildly dilated. Pericardium: There is no evidence of pericardial effusion. Mitral Valve: The mitral valve is normal in structure. Mild mitral valve regurgitation. No evidence of mitral valve stenosis. Tricuspid Valve: The tricuspid valve is normal in structure. Tricuspid valve regurgitation is mild . No evidence of tricuspid stenosis. Aortic Valve: The aortic valve is normal in structure. Aortic valve regurgitation is mild. No aortic stenosis is present. Aortic valve mean gradient measures 4.5 mmHg. Aortic valve peak gradient measures 8.1 mmHg. Aortic valve area, by VTI measures 2.86 cm. Pulmonic Valve: The pulmonic valve was normal in structure. Pulmonic valve regurgitation is not visualized. No evidence of pulmonic stenosis. Aorta: The aortic root is normal in size and structure. Venous: The inferior vena cava is dilated in size with less than 50% respiratory variability, suggesting right atrial pressure of 15 mmHg. IAS/Shunts: No atrial level shunt detected by color flow Doppler.  LEFT VENTRICLE PLAX 2D LVIDd:         6.90 cm      Diastology LVIDs:         5.60 cm      LV e' medial:    5.77 cm/s LV PW:         1.00 cm      LV E/e' medial:  19.1 LV IVS:        1.00 cm  LV e' lateral:   7.40 cm/s LVOT diam:     2.30 cm      LV E/e' lateral: 14.9 LV SV:         64 LV SV Index:   29 LVOT Area:     4.15 cm  LV Volumes (MOD) LV vol d, MOD A2C: 207.0 ml LV vol d, MOD A4C: 178.0 ml LV vol s, MOD A2C: 144.0 ml LV vol s, MOD A4C: 113.0 ml LV SV MOD A2C:     63.0 ml LV SV MOD A4C:     178.0 ml LV SV MOD BP:      64.2 ml RIGHT VENTRICLE  RV Basal diam:  4.50 cm RV Mid diam:    4.00 cm RV S prime:     14.60 cm/s TAPSE (M-mode): 2.5 cm LEFT ATRIUM              Index        RIGHT ATRIUM           Index LA diam:        4.60 cm  2.10 cm/m   RA Area:     16.80 cm LA Vol (A2C):   108.0 ml 49.31 ml/m  RA Volume:   42.50 ml  19.41 ml/m LA Vol (A4C):   82.7 ml  37.76 ml/m LA Biplane Vol: 100.0 ml 45.66 ml/m  AORTIC VALVE AV Area (Vmax):    2.73 cm AV Area (Vmean):   2.83 cm AV Area (VTI):     2.86 cm AV Vmax:           142.50 cm/s AV Vmean:          98.800 cm/s AV VTI:            0.224 m AV Peak Grad:      8.1 mmHg AV Mean Grad:      4.5 mmHg LVOT Vmax:         93.60 cm/s LVOT Vmean:        67.300 cm/s LVOT VTI:          0.154 m LVOT/AV VTI ratio: 0.69  AORTA Ao Root diam: 3.20 cm Ao Asc diam:  3.65 cm MITRAL VALVE                TRICUSPID VALVE MV Area (PHT): 6.37 cm     TR Peak grad:   53.3 mmHg MV Decel Time: 119 msec     TR Vmax:        365.00 cm/s MR Peak grad: 75.3 mmHg MR Vmax:      434.00 cm/s   SHUNTS MV E velocity: 110.00 cm/s  Systemic VTI:  0.15 m MV A velocity: 60.20 cm/s   Systemic Diam: 2.30 cm MV E/A ratio:  1.83 Toribio Fuel MD Electronically signed by Toribio Fuel MD Signature Date/Time: 01/13/2024/11:56:11 AM    Final    DG CHEST PORT 1 VIEW Result Date: 01/13/2024 CLINICAL DATA:  [id number] SOB (shortness of breath) [id number] EXAM: DG CHEST 1V PORT COMPARISON:  01/12/2024 FINDINGS: Elevation of the right hemidiaphragm. Progressive clearing in the right upper lung zone. Patchy airspace opacities in the right lung base, unchanged. Streaky left basilar airspace opacities, likely atelectasis, unchanged. The left costophrenic sulcus is excluded from the field of view. No pneumothorax. No cardiomegaly. Tortuous aorta with aortic atherosclerosis. No acute fracture or destructive lesions. Multilevel thoracic osteophytosis. IMPRESSION: 1. Similar patchy airspace disease in the right lung base with progressive clearing in the  right upper  lung zone. 2. Small left pleural effusion with streaky atelectasis in the left lung base. Electronically Signed   By: Rogelia Myers M.D.   On: 01/13/2024 08:47   DG CHEST PORT 1 VIEW Result Date: 01/12/2024 CLINICAL DATA:  Shortness of breath. EXAM: PORTABLE CHEST 1 VIEW COMPARISON:  01/11/2024. FINDINGS: Cardiomegaly with central pulmonary vascular congestion. Patchy airspace opacities throughout the right lung, most pronounced at the right mid to lower lung zone. Streaky left basilar opacities. Similar trace left effusion. No pneumothorax. Advanced degenerative arthropathy of the right glenohumeral joint. No acute osseous abnormality. IMPRESSION: 1. Patchy airspace opacities throughout the right lung and streaky left basilar opacities, concerning for multifocal pneumonia. 2. Similar trace left effusion. 3. Cardiomegaly with central pulmonary vascular congestion. Electronically Signed   By: Harrietta Sherry M.D.   On: 01/12/2024 09:53   CT CHEST WO CONTRAST Result Date: 01/12/2024 CLINICAL DATA:  Shortness of breath, cough. Recent pneumonia. Evaluate for pleural effusion. EXAM: CT CHEST WITHOUT CONTRAST TECHNIQUE: Multidetector CT imaging of the chest was performed following the standard protocol without IV contrast. RADIATION DOSE REDUCTION: This exam was performed according to the departmental dose-optimization program which includes automated exposure control, adjustment of the mA and/or kV according to patient size and/or use of iterative reconstruction technique. COMPARISON:  01/08/2024 FINDINGS: Cardiovascular: Heart is mildly enlarged. Diffuse coronary artery and moderate aortic atherosclerosis. No evidence of aortic aneurysm. Mediastinum/Nodes: Small borderline is crash that borderline sized mediastinal lymph nodes, likely reactive. No axillary or visible hilar adenopathy. Trachea and esophagus are unremarkable. Thyroid  unremarkable. Lungs/Pleura: Airspace disease again noted throughout the right  lung, slightly improved in the right upper lobe, not significantly changed in the right middle lobe or right lower lobe. Increasing airspace disease in the left lower lobe. Small left pleural effusion is stable since prior study. Trace right pleural effusion, also stable. Upper Abdomen: No acute findings Musculoskeletal: Chest wall soft tissues are unremarkable. No acute bony abnormality. IMPRESSION: Airspace disease throughout the right lung with some improvement in the right upper lobe, unchanged in the right middle lobe and right lower lobe. Increasing airspace disease in the left lower lobe. Findings compatible with multifocal pneumonia. Small left pleural effusion and trace right pleural effusion, stable since prior study. Cardiomegaly, three-vessel coronary artery disease. Aortic Atherosclerosis (ICD10-I70.0). Electronically Signed   By: Franky Crease M.D.   On: 01/12/2024 02:35   DG CHEST PORT 1 VIEW Result Date: 01/11/2024 CLINICAL DATA:  Cough.  Pneumonia. EXAM: PORTABLE CHEST 1 VIEW COMPARISON:  Chest radiograph dated 01/07/2024. FINDINGS: Mild eventration of the right hemidiaphragm. Bibasilar atelectasis/scarring. No interval new is knee right perihilar densities which may represent atelectasis or pneumonia. No large pleural effusion. No pneumothorax. Stable cardiac silhouette. No acute osseous pathology. IMPRESSION: Right perihilar atelectasis or pneumonia. Electronically Signed   By: Vanetta Chou M.D.   On: 01/11/2024 15:44     Assessment and Plan:  Acute on chronic systolic heart failure Pulmonary hypertension, reduced RV function - historically with LVEF 40-45% range, most recent echo 06/2023 - presented with hypoxic respiratory failure, dyspnea, and LE edema - echo now with further reduction in LVEF to 25-30% along with incomplete description of chest pain - on 12.5 mg toprol  - will monitor output with diuresis - continue 300 mg irbesartan  - albumin 2.8-3.1 - on 40 mg IV lasix  BID  - continue this - given hx of CAD, will schedule for right and left heart cath on Monday after diuresis Start entresto  low  dose    NSVT, PVCs, PACs - ectopy on telemetry with PVCs and NSVT - Mg 2.1 this morning, K 3.7 - receiving 12.5 mg toprol    - add low dose amiodarone  PO   Metapneumovirus Acute hypoxic respiratory failure - Receiving breathing treatments -- remains on O2 2 L - CXR still with infiltrate right lung and abnormal exam    CAD - prior BMS stenting to RCA with restenosis requiring thrombectomy, last intervention 2010 - recent nuclear stress test reassuring, negative for ischemia - given further reduction in LVEF, chest pain, and mild troponin elevation, will plan for R/L HC on Monday - continue statin - see above regarding cath on Monday   Hypertension - managed in the context of CHF   Risk Assessment/Risk Scores:    TIMI Risk Score for Unstable Angina or Non-ST Elevation MI:   The patient's TIMI risk score is 6, which indicates a 41% risk of all cause mortality, new or recurrent myocardial infarction or need for urgent revascularization in the next 14 days.  [place]  [employer] Association (NYHA) Functional Class NYHA Class IV       For questions or updates, please contact [facility] Please consult [email or web address] for contact info under   Maude Emmer MD [facility]

## 2024-01-14 NOTE — Progress Notes (Signed)
   01/14/24 2230  BiPAP/CPAP/SIPAP  Reason BIPAP/CPAP not in use Non-compliant (PATIENT REFUSES. NOT IN ROOM)  BiPAP/CPAP /SiPAP Vitals  Resp 20  MEWS Score/Color  MEWS Score 1  MEWS Score Color Green

## 2024-01-14 NOTE — Evaluation (Signed)
 Occupational Therapy Evaluation Patient Details Name: Chase Combs MRN: [id number] DOB: [birth year] Today's Date: 01/14/2024   History of Present Illness   Chase Combs is a 78 yr old male admitted to the hospital with shortness of breath, cough, and rhinorrhea. He was found to have acute hypoxemic respiratory failure due to CAP. PMH: CAD, MI s/p stent x2, HTN. HLD, prostate CA, OSA, prediabetes, L TKA in May 2025, chronic anemia     Clinical Impressions The pt is currently presenting slightly below his baseline level of functioning for self-care management. During the session, he required SBA for most tasks, including simulated lower body dressing, sit to stand, and for functional ambulation using a RW. He was on room air and his O2 saturation was 96% on room air at rest, and 90% on room air with activity. He reported feeling mildly weaker than normal and he also reported lightheadedness with activity. OT will follow him for services in the acute care setting to maximize his independence with self care tasks and to facilitate his safe return home. Anticipate no post-acute OT needs.      If plan is discharge home, recommend the following:   Assistance with cooking/housework;Assist for transportation     Functional Status Assessment   Patient has had a recent decline in their functional status and demonstrates the ability to make significant improvements in function in a reasonable and predictable amount of time.     Equipment Recommendations   None recommended by OT     Recommendations for Other Services         Precautions/Restrictions   Restrictions Weight Bearing Restrictions Per Provider Order: No Other Position/Activity Restrictions: monitor O2     Mobility Bed Mobility               General bed mobility comments: pt was received seated EOB    Transfers Overall transfer level: Needs assistance Equipment used: Rolling walker (2 wheels) Transfers: Sit  to/from Stand Sit to Stand: Supervision                  Balance Overall balance assessment: Mild deficits observed, not formally tested                ADL either performed or assessed with clinical judgement   ADL Overall ADL's : Needs assistance/impaired Eating/Feeding: Independent;Sitting   Grooming: Set up;Standing           Upper Body Dressing : Set up;Sitting   Lower Body Dressing: Set up;Sitting/lateral leans   Toilet Transfer: Set up;Supervision/safety;Rolling walker (2 wheels);Ambulation                    Pertinent Vitals/Pain Pain Assessment Pain Assessment: No/denies pain     Extremity/Trunk Assessment Upper Extremity Assessment Upper Extremity Assessment: Overall WFL for tasks assessed;Right hand dominant   Lower Extremity Assessment Lower Extremity Assessment: Overall WFL for tasks assessed       Communication Communication Communication: No apparent difficulties   Cognition Arousal: Alert Behavior During Therapy: WFL for tasks assessed/performed               OT - Cognition Comments: oriented x4                 Following commands: Intact       Cueing  General Comments   Cueing Techniques: Verbal cues      Exercises   Shoulder Instructions     Home Living Family/patient expects to be discharged to::  Private residence Living Arrangements: Spouse/significant other Available Help at Discharge: Family Type of Home: House Home Access: Ramped entrance     Home Layout: One level     Bathroom Shower/Tub: Tub/shower unit         Home Equipment: Agricultural consultant (2 wheels);Rollator (4 wheels);Cane - single point;Shower seat;Lift chair;Wheelchair - power          Prior Functioning/Environment Prior Level of Function : Driving             Mobility Comments: He used a rollator for longer distances. ADLs Comments: Modified independent to independent with ADLs. His spouse managed the cooking and  cleaning.    OT Problem List: Decreased strength;Impaired balance (sitting and/or standing)   OT Treatment/Interventions: Self-care/ADL training;Therapeutic exercise;Therapeutic activities;Energy conservation;DME and/or AE instruction;Patient/family education      OT Goals(Current goals can be found in the care plan section)   Acute Rehab OT Goals OT Goal Formulation: With patient Time For Goal Achievement: 01/28/24 Potential to Achieve Goals: Good ADL Goals Pt Will Perform Grooming: with modified independence;standing Pt Will Perform Lower Body Dressing: with modified independence;sit to/from stand Pt Will Transfer to Toilet: with modified independence;ambulating Pt Will Perform Toileting - Clothing Manipulation and hygiene: with modified independence;sit to/from stand   OT Frequency:  Min 1X/week       AM-PAC OT 6 Clicks Daily Activity     Outcome Measure Help from another person eating meals?: None Help from another person taking care of personal grooming?: A Little Help from another person toileting, which includes using toliet, bedpan, or urinal?: A Little Help from another person bathing (including washing, rinsing, drying)?: A Little Help from another person to put on and taking off regular upper body clothing?: None Help from another person to put on and taking off regular lower body clothing?: A Little 6 Click Score: 20   End of Session Equipment Utilized During Treatment: Rolling walker (2 wheels) Nurse Communication: Mobility status  Activity Tolerance: Patient tolerated treatment well Patient left: in bed;with call bell/phone within reach;with family/visitor present  OT Visit Diagnosis: Unsteadiness on feet (R26.81);Muscle weakness (generalized) (M62.81)                Time: 8369-8352 OT Time Calculation (min): 17 min Charges:  OT General Charges $OT Visit: 1 Visit OT Evaluation $OT Eval Low Complexity: 1 Low    Chase Combs, OTR/L 01/14/2024, 5:37  PM

## 2024-01-14 NOTE — Progress Notes (Signed)
 PROGRESS NOTE    Chase Combs  FMW:[id number] DOB: [birth year] DOA: 01/07/2024 PCP: Wonda Worth SQUIBB, PA   Brief Narrative:  Chase Combs is a 78 y.o. 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  He was admitted to the hospital 5/17-10/27/2023 for opioid induced ileus versus SBO, possible colitis, and aspiration pneumonia.   Patient is presenting with a chief complaint of shortness of breath.  Reporting 3-day history of shortness of breath, cough, and rhinorrhea.  Also reporting swelling of his legs for the past 3 months which has gotten progressively worse.  He does not take any diuretics at home.  He is reporting mild substernal chest pressure unclear when this started.  Denies vomiting, abdominal pain, or diarrhea.   Over course of hospitalization, competed abx for PNA but continues to be SOB. Lasix  was resumed and now he is on BID.  Respiratory medications adjustments have been made. Repeat ECHO showed worsening LVEF and cardiology consulted and have adjusted some of his medications and he is now on amiodarone  and Entresto .  Plan is for right and left heart cath on Monday, 01/17/2024.  Assessment and Plan:  Pneumonia due to metapneumovirus with sepsis Acute Hypoxemic respiratory failure  in the setting of below -Right sided infiltrate (similar to last admission in May) - Procalcitonin < 0.10 - 8/16> Metapneumovirus positive  - antibiotics stopped  -Continue Oxygen, Guaifenesin / Dextromethorphan  and Tussionex- he is allergic to Codeine.  - added Mucomyst  nebs and chest PT; adjusted breathing treatments and will place on Xopenex , Atrovent , Brovana  and budesonide ; will also give him IV Solu-Medrol  60 mg every 12h - Does not wear oxygen at home will need to  continue to monitor respiratory status carefully and continue with flutter valve, incentive spirometry and guaifenesin  1200 mg twice daily -Repeat CXR this AM done and showed Similar patchy airspace disease in the right lung base with progressive clearing in the right upper lung zone. Small left pleural effusion with streaky atelectasis in the left lung base. -CT scan without contrast done yesterday and showed Airspace disease throughout the right lung with some improvement in the right upper lobe, unchanged in the right middle lobe and right lower lobe. Increasing airspace disease in the left lower lobe. Findings compatible with multifocal pneumonia. Small left pleural effusion and trace right pleural effusion, stable since prior study. Cardiomegaly, three-vessel coronary artery disease. Aortic Atherosclerosis -Will obtain CTA PE Protocol given Right Sided Moderate Dysfunction but Respiratory Status is slowly improving w/ Diuresis. This is pending  -Obtain SLP evaluation and they are recommending regular diet with thin liquids.   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  -Checked BNP and went from 842.8 -> 1,747.3. Repeat Echocardiogram showed LVEF of 25-30% with severely decreased Fxn, LV Global Hypokinesis, and LVDP consistent w/ Grade 1 DD and RV was moderately reduced with Severely elevated Pulmonary Artery Systolic Pressure. -CXR as above - Check CTA Chest PE protocol to assess Decreased RV Fxn to r/o PE;  -Continue Diuresis w/ IV Furosemide  40 mg BID  -Patient recently had a nuclear stress test that was reassuring and negative for ischemia -Will Consult Cardiology for further  evaluation and Recc's and there are scheduling the patient for a right and left heart cath on Monday after diuresis and starting Entresto   low-dose -Irbesartan  300 mg po daily is currently being changed to Entresto  24-26 mg/tab. 1 tab p.o. twice daily.  Continue Toprol  12.5 mg po daily; also continue with aspirin  81 mg p.o. daily and rosuvastatin  20 mg p.o. daily -Troponin 51, likely due to demand ischemia -Strict I's and O's and daily weights;  Intake/Output Summary (Last 24 hours) at 01/14/2024 1559 Last data filed at 01/14/2024 1200 Gross per 24 hour  Intake 956 ml  Output 3150 ml  Net -2194 ml  -Will need repeat chest x-ray in the a.m. and an Ambulatory home O2 screen prior to discharge  NSVT, PVCs and PACs: Continue with beta-blocker and correct electrolytes.  Patient had noted ectopy on telemetry with PVCs and NSVT.  Cardiology is adding low-dose amiodarone  p.o.   Essential HTN: Held Amlodipine  for now as BP has been either normal or low; he was continued on metoprolol  succinate 12.5 mg p.o. daily; Irbesartan  300 mg daily is now being changed to a subacute show-valsartan  24-26 mg/tab. 1 tab p.o. twice daily.  Continue monitor blood pressures per protocol.  Last blood pressure reading was 138/85.  Hyperlipidemia: C/w Rosuvastatin  20 mg po daily   BPH: C/w Tamsulosin  0.4 mg po Daily    Normocytic Anemia: Hgb/Hct Trend: Recent Labs  Lab 01/07/24 2154 01/07/24 2330 01/10/24 0445 01/12/24 0517 01/13/24 0537 01/14/24 0526  HGB 11.0* 11.6* 10.8* 10.9* 11.0* 11.1*  HCT 34.6* 34.0* 35.2* 35.5* 37.6* 37.1*  MCV 92.0  --  93.9 94.7 95.4 93.2  -Checked Anemia Panel noted an iron level of 31, UIBC of 308, TIBC of 339, saturation ration of 9%, ferritin level 113, folate level 14.3 and vitamin B12 1019. CTM for S/Sx of Bleeding; No overt bleeding noted. Repeat CBC in the AM  Hypoalbuminemia: Patient's Albumin Lvl is now 3.1. CTM and Trend and repeat CMP in the AM  Overweight: Complicates overall prognosis and care. Estimated body mass index is 28.85 kg/m as calculated from the following:   Height as of this encounter: 6'  (1.829 m).   Weight as of this encounter: 96.5 kg. Weight Loss and Dietary Counseling given   DVT prophylaxis: Place TED hose Start: 01/10/24 1026 enoxaparin  (LOVENOX ) injection 40 mg Start: 01/08/24 1000    Code Status: Full Code Family Communication: No family currently at bedside  Disposition Plan:  Level of care: Progressive Status is: Inpatient Remains inpatient appropriate because: Needs further clinical improvement and clearance by the specialists   Consultants:  Cardiology  Procedures:  Plan is for right and left heart cath 01/17/2024 Echocardiogram CTA of the chest PE protocol pending  Antimicrobials:  Anti-infectives (From admission, onward)    Start     Dose/Rate Route Frequency Ordered Stop   01/08/24 0115  ceFEPIme  (MAXIPIME ) 2 g in sodium chloride  0.9 % 100 mL IVPB  Status:  Discontinued        2 g 200 mL/hr over 30 Minutes Intravenous Every 8 hours 01/08/24 0114 01/08/24 1425   01/08/24 0115  linezolid  (ZYVOX ) IVPB 600 mg  Status:  Discontinued        600 mg 300 mL/hr over 60 Minutes Intravenous Every 12 hours 01/08/24 0114 01/08/24 1425       Subjective: Seen and examined at bedside and is still feeling short of breath but thinks he is doing little bit better.  No nausea or vomiting.  States that he has difficulty coughing up his phlegm in his chest has a little bit discomfort doing that.  Understands that he will be getting a heart cath on Monday.  No other concerns or complaints at this time.  Objective: Vitals:   01/14/24 0738 01/14/24 1308 01/14/24 1342 01/14/24 1404  BP:  138/85    Pulse:  (!) 59    Resp:      Temp:  98 F (36.7 C)    TempSrc:  Oral    SpO2: 96% 96% 97% 95%  Weight:      Height:        Intake/Output Summary (Last 24 hours) at 01/14/2024 1606 Last data filed at 01/14/2024 1200 Gross per 24 hour  Intake 956 ml  Output 3150 ml  Net -2194 ml   Filed Weights   01/12/24 0500 01/13/24 0500 01/14/24 0500  Weight: 99.6 kg 96.8 kg  96.5 kg   Examination: Physical Exam:  Constitutional: WN/WD overweight chronically ill-appearing elderly Caucasian male in no acute distress Respiratory: Diminished to auscultation bilaterally with coarse breath sounds and does have some rhonchi and crackles but no appreciable wheezing or rales.  Has a normal respiratory effort and is not tachypneic or using a accessory muscles to breathe but is wearing supplemental oxygen via nasal cannula Cardiovascular: RRR, no murmurs / rubs / gallops. S1 and S2 auscultated.  Has 1+ extremity edema Abdomen: Soft, non-tender, distended secondary to body. Bowel sounds positive.  GU: Deferred. Musculoskeletal: No clubbing / cyanosis of digits/nails. No joint deformity upper and lower extremities.  Skin: No rashes, lesions, ulcers on limited skin evaluation. No induration; Warm and dry.  Neurologic: CN 2-12 grossly intact with no focal deficits. Romberg sign and cerebellar reflexes not assessed.  Psychiatric: Normal judgment and insight. Alert and oriented x 3. Normal mood and appropriate affect.   Data Reviewed: I have personally reviewed following labs and imaging studies  CBC: Recent Labs  Lab 01/07/24 2154 01/07/24 2330 01/10/24 0445 01/12/24 0517 01/13/24 0537 01/14/24 0526  WBC 6.2  --  7.0 7.3 4.3 8.3  NEUTROABS  --   --   --  6.0 3.8 7.5  HGB 11.0* 11.6* 10.8* 10.9* 11.0* 11.1*  HCT 34.6* 34.0* 35.2* 35.5* 37.6* 37.1*  MCV 92.0  --  93.9 94.7 95.4 93.2  PLT 320  --  264 267 257 312   Basic Metabolic Panel: Recent Labs  Lab 01/09/24 1436 01/11/24 0539 01/12/24 0517 01/13/24 0537 01/14/24 0526  NA 135 134* 132* 135 135  K 3.6 4.0 3.7 4.4 3.7  CL 96* 98 95* 95* 92*  CO2 27 28 28 30 31   GLUCOSE 128* 121* 145* 200* 204*  BUN 23 18 15 13 18   CREATININE 0.68 0.51* 0.48* 0.56* 0.66  CALCIUM  8.6* 8.3* 8.2* 8.5* 8.8*  MG  --   --  2.0 2.1 2.1  PHOS  --   --  3.6 3.9 4.4   GFR: Estimated Creatinine Clearance: 94.7 mL/min (by C-G  formula based on SCr of 0.66 mg/dL). Liver Function Tests: Recent Labs  Lab 01/12/24 0517 01/13/24 0537 01/14/24 0526  AST 38 26 26  ALT 41 31 35  ALKPHOS 55 55 59  BILITOT 0.6 0.8 0.6  PROT 5.7* 5.8* 6.3*  ALBUMIN 2.9* 2.8* 3.1*   No results for input(s): LIPASE, AMYLASE in the last 168 hours. No results for input(s): AMMONIA in the last 168 hours. Coagulation Profile: No results for input(s): INR, PROTIME in the last 168  hours. Cardiac Enzymes: No results for input(s): CKTOTAL, CKMB, CKMBINDEX, TROPONINI in the last 168 hours. BNP (last 3 results) Recent Labs    01/07/24 2301  PROBNP 15,245.0*   HbA1C: No results for input(s): HGBA1C in the last 72 hours. CBG: No results for input(s): GLUCAP in the last 168 hours. Lipid Profile: No results for input(s): CHOL, HDL, LDLCALC, TRIG, CHOLHDL, LDLDIRECT in the last 72 hours. Thyroid  Function Tests: No results for input(s): TSH, T4TOTAL, FREET4, T3FREE, THYROIDAB in the last 72 hours. Anemia Panel: Recent Labs    01/14/24 0526  VITAMINB12 1,019*  FOLATE 14.3  FERRITIN 113  TIBC 339  IRON 31*  RETICCTPCT 1.9   Sepsis Labs: Recent Labs  Lab 01/08/24 0126 01/08/24 0435  PROCALCITON  --  <0.10  LATICACIDVEN 1.7  --    Recent Results (from the past 240 hours)  Culture, blood (Routine X 2) w Reflex to ID Panel     Status: None   Collection Time: 01/08/24  1:10 AM   Specimen: BLOOD LEFT FOREARM  Result Value Ref Range Status   Specimen Description   Final    BLOOD LEFT FOREARM Performed at [facility], [address] [place], [place] [address]    Special Requests   Final    BOTTLES DRAWN AEROBIC AND ANAEROBIC Blood Culture adequate volume Performed at [facility], [address], [place], [place] [address]    Culture   Final    NO GROWTH 5 DAYS Performed at [facility], [address] [place], [place] [address]     Report Status 01/13/2024 FINAL  Final  Culture, blood (Routine X 2) w Reflex to ID Panel     Status: None   Collection Time: 01/08/24  1:20 AM   Specimen: BLOOD LEFT HAND  Result Value Ref Range Status   Specimen Description   Final    BLOOD LEFT HAND Performed at [facility], [address] [place], [place] [address]    Special Requests   Final    BOTTLES DRAWN AEROBIC AND ANAEROBIC Blood Culture results may not be optimal due to an inadequate volume of blood received in culture bottles Performed at [facility], [address], [place], [place] [address]    Culture   Final    NO GROWTH 5 DAYS Performed at [facility], [address], [place], [place] [address]    Report Status 01/13/2024 FINAL  Final  Resp panel by RT-PCR (RSV, Flu A&B, Covid) Anterior Nasal Swab     Status: None   Collection Time: 01/08/24  1:29 AM   Specimen: Anterior Nasal Swab  Result Value Ref Range Status   SARS Coronavirus 2 by RT PCR NEGATIVE NEGATIVE Final    Comment: (NOTE) SARS-CoV-2 target nucleic acids are NOT DETECTED.  The SARS-CoV-2 RNA is generally detectable in upper respiratory specimens during the acute phase of infection. The lowest concentration of SARS-CoV-2 viral copies this assay can detect is 138 copies/mL. A negative result does not preclude SARS-Cov-2 infection and should not be used as the sole basis for treatment or other patient management decisions. A negative result may occur with  improper specimen collection/handling, submission of specimen other than nasopharyngeal swab, presence of viral mutation(s) within the areas targeted by this assay, and inadequate number of viral copies(<138 copies/mL). A negative result must be combined with clinical observations, patient history, and epidemiological information. The expected result is Negative.  Fact Sheet for Patients:  BloggerCourse.com  Fact Sheet for  Healthcare Providers:  SeriousBroker.it  This test is no t yet approved or cleared by the [place]  FDA and  has been authorized for detection and/or diagnosis of SARS-CoV-2 by FDA under an Emergency Use Authorization (EUA). This EUA will remain  in effect (meaning this test can be used) for the duration of the COVID-19 declaration under Section 564(b)(1) of the Act, 21 U.S.C.section 360bbb-3(b)(1), unless the authorization is terminated  or revoked sooner.       Influenza A by PCR NEGATIVE NEGATIVE Final   Influenza B by PCR NEGATIVE NEGATIVE Final    Comment: (NOTE) The Xpert Xpress SARS-CoV-2/FLU/RSV plus assay is intended as an aid in the diagnosis of influenza from Nasopharyngeal swab specimens and should not be used as a sole basis for treatment. Nasal washings and aspirates are unacceptable for Xpert Xpress SARS-CoV-2/FLU/RSV testing.  Fact Sheet for Patients: BloggerCourse.com  Fact Sheet for Healthcare Providers: SeriousBroker.it  This test is not yet approved or cleared by the [place]  FDA and has been authorized for detection and/or diagnosis of SARS-CoV-2 by FDA under an Emergency Use Authorization (EUA). This EUA will remain in effect (meaning this test can be used) for the duration of the COVID-19 declaration under Section 564(b)(1) of the Act, 21 U.S.C. section 360bbb-3(b)(1), unless the authorization is terminated or revoked.     Resp Syncytial Virus by PCR NEGATIVE NEGATIVE Final    Comment: (NOTE) Fact Sheet for Patients: BloggerCourse.com  Fact Sheet for Healthcare Providers: SeriousBroker.it  This test is not yet approved or cleared by the [place]  FDA and has been authorized for detection and/or diagnosis of SARS-CoV-2 by FDA under an Emergency Use Authorization (EUA). This EUA will remain in effect (meaning this  test can be used) for the duration of the COVID-19 declaration under Section 564(b)(1) of the Act, 21 U.S.C. section 360bbb-3(b)(1), unless the authorization is terminated or revoked.  Performed at [facility], [address] [place], [place] [address]   Respiratory (~20 pathogens) panel by PCR     Status: Abnormal   Collection Time: 01/08/24  9:54 AM   Specimen: Nasopharyngeal Swab; Respiratory  Result Value Ref Range Status   Adenovirus NOT DETECTED NOT DETECTED Final   Coronavirus 229E NOT DETECTED NOT DETECTED Final    Comment: (NOTE) The Coronavirus on the Respiratory Panel, DOES NOT test for the novel  Coronavirus (2019 nCoV)    Coronavirus HKU1 NOT DETECTED NOT DETECTED Final   Coronavirus NL63 NOT DETECTED NOT DETECTED Final   Coronavirus OC43 NOT DETECTED NOT DETECTED Final   Metapneumovirus DETECTED (A) NOT DETECTED Final   Rhinovirus / Enterovirus NOT DETECTED NOT DETECTED Final   Influenza A NOT DETECTED NOT DETECTED Final   Influenza B NOT DETECTED NOT DETECTED Final   Parainfluenza Virus 1 NOT DETECTED NOT DETECTED Final   Parainfluenza Virus 2 NOT DETECTED NOT DETECTED Final   Parainfluenza Virus 3 NOT DETECTED NOT DETECTED Final   Parainfluenza Virus 4 NOT DETECTED NOT DETECTED Final   Respiratory Syncytial Virus NOT DETECTED NOT DETECTED Final   Bordetella pertussis NOT DETECTED NOT DETECTED Final   Bordetella Parapertussis NOT DETECTED NOT DETECTED Final   Chlamydophila pneumoniae NOT DETECTED NOT DETECTED Final   Mycoplasma pneumoniae NOT DETECTED NOT DETECTED Final    Comment: Performed at [facility], [address], [place], [place] [address]    Radiology Studies: DG CHEST PORT 1 VIEW Result Date: 01/14/2024 CLINICAL DATA:  Shortness of breath. EXAM:  PORTABLE CHEST 1 VIEW COMPARISON:  01/13/2024 FINDINGS: Stable asymmetric elevation right hemidiaphragm with similar right base streaky opacity compatible with atelectasis or  infiltrate. The cardio pericardial silhouette is enlarged. No acute bony abnormality. Degenerative changes noted in the right shoulder. IMPRESSION: Similar right base atelectasis or infiltrate. Electronically Signed   By: Camellia Candle M.D.   On: 01/14/2024 09:21   ECHOCARDIOGRAM COMPLETE Result Date: 01/13/2024    ECHOCARDIOGRAM REPORT   Patient Name:   Chase Combs Date of Exam: 01/13/2024 Medical Rec #:  [id number]      Height:       72.0 in Accession #:    [id number]     Weight:       213.4 lb Date of Birth:  21-Sep-1945      BSA:          2.190 m Patient Age:    78 years       BP:           134/89 mmHg Patient Gender: M              HR:           104 bpm. Exam Location:  Inpatient Procedure: 2D Echo, Color Doppler and Cardiac Doppler (Both Spectral and Color            Flow Doppler were utilized during procedure). Indications:    Dyspnea  History:        Patient has prior history of Echocardiogram examinations, most                 recent 07/12/2023. CAD.  Sonographer:    Benard Stallion Referring Phys: [id number] Yesenia Fontenette LATIF [facility] IMPRESSIONS  1. Left ventricular ejection fraction, by estimation, is 25 to 30%. The left ventricle has severely decreased function. The left ventricle demonstrates global hypokinesis. The left ventricular internal cavity size was moderately dilated. Left ventricular diastolic parameters are consistent with Grade I diastolic dysfunction (impaired relaxation).  2. Right ventricular systolic function is moderately reduced. The right ventricular size is normal. There is severely elevated pulmonary artery systolic pressure.  3. Left atrial size was moderately dilated.  4. Right atrial size was mildly dilated.  5. The mitral valve is normal in structure. Mild mitral valve regurgitation. No evidence of mitral stenosis.  6. The aortic valve is normal in structure. Aortic valve regurgitation is mild. No aortic stenosis is present.  7. The inferior vena cava is dilated in size with <50%  respiratory variability, suggesting right atrial pressure of 15 mmHg. FINDINGS  Left Ventricle: Left ventricular ejection fraction, by estimation, is 25 to 30%. The left ventricle has severely decreased function. The left ventricle demonstrates global hypokinesis. The left ventricular internal cavity size was moderately dilated. There is no left ventricular hypertrophy. Abnormal (paradoxical) septal motion, consistent with left bundle branch block. Left ventricular diastolic function could not be evaluated due to atrial fibrillation. Left ventricular diastolic parameters are consistent with Grade I diastolic dysfunction (impaired relaxation). Right Ventricle: The right ventricular size is normal. No increase in right ventricular wall thickness. Right ventricular systolic function is moderately reduced. There is severely elevated pulmonary artery systolic pressure. The tricuspid regurgitant velocity is 3.65 m/s, and with an assumed right atrial pressure of 15 mmHg, the estimated right ventricular systolic pressure is 68.3 mmHg. Left Atrium: Left atrial size was moderately dilated. Right Atrium: Right atrial size was mildly dilated. Pericardium: There is no evidence of pericardial effusion. Mitral Valve: The mitral valve is normal  in structure. Mild mitral valve regurgitation. No evidence of mitral valve stenosis. Tricuspid Valve: The tricuspid valve is normal in structure. Tricuspid valve regurgitation is mild . No evidence of tricuspid stenosis. Aortic Valve: The aortic valve is normal in structure. Aortic valve regurgitation is mild. No aortic stenosis is present. Aortic valve mean gradient measures 4.5 mmHg. Aortic valve peak gradient measures 8.1 mmHg. Aortic valve area, by VTI measures 2.86 cm. Pulmonic Valve: The pulmonic valve was normal in structure. Pulmonic valve regurgitation is not visualized. No evidence of pulmonic stenosis. Aorta: The aortic root is normal in size and structure. Venous: The inferior  vena cava is dilated in size with less than 50% respiratory variability, suggesting right atrial pressure of 15 mmHg. IAS/Shunts: No atrial level shunt detected by color flow Doppler.  LEFT VENTRICLE PLAX 2D LVIDd:         6.90 cm      Diastology LVIDs:         5.60 cm      LV e' medial:    5.77 cm/s LV PW:         1.00 cm      LV E/e' medial:  19.1 LV IVS:        1.00 cm      LV e' lateral:   7.40 cm/s LVOT diam:     2.30 cm      LV E/e' lateral: 14.9 LV SV:         64 LV SV Index:   29 LVOT Area:     4.15 cm  LV Volumes (MOD) LV vol d, MOD A2C: 207.0 ml LV vol d, MOD A4C: 178.0 ml LV vol s, MOD A2C: 144.0 ml LV vol s, MOD A4C: 113.0 ml LV SV MOD A2C:     63.0 ml LV SV MOD A4C:     178.0 ml LV SV MOD BP:      64.2 ml RIGHT VENTRICLE RV Basal diam:  4.50 cm RV Mid diam:    4.00 cm RV S prime:     14.60 cm/s TAPSE (M-mode): 2.5 cm LEFT ATRIUM              Index        RIGHT ATRIUM           Index LA diam:        4.60 cm  2.10 cm/m   RA Area:     16.80 cm LA Vol (A2C):   108.0 ml 49.31 ml/m  RA Volume:   42.50 ml  19.41 ml/m LA Vol (A4C):   82.7 ml  37.76 ml/m LA Biplane Vol: 100.0 ml 45.66 ml/m  AORTIC VALVE AV Area (Vmax):    2.73 cm AV Area (Vmean):   2.83 cm AV Area (VTI):     2.86 cm AV Vmax:           142.50 cm/s AV Vmean:          98.800 cm/s AV VTI:            0.224 m AV Peak Grad:      8.1 mmHg AV Mean Grad:      4.5 mmHg LVOT Vmax:         93.60 cm/s LVOT Vmean:        67.300 cm/s LVOT VTI:          0.154 m LVOT/AV VTI ratio: 0.69  AORTA Ao Root diam: 3.20 cm Ao Asc diam:  3.65 cm MITRAL  VALVE                TRICUSPID VALVE MV Area (PHT): 6.37 cm     TR Peak grad:   53.3 mmHg MV Decel Time: 119 msec     TR Vmax:        365.00 cm/s MR Peak grad: 75.3 mmHg MR Vmax:      434.00 cm/s   SHUNTS MV E velocity: 110.00 cm/s  Systemic VTI:  0.15 m MV A velocity: 60.20 cm/s   Systemic Diam: 2.30 cm MV E/A ratio:  1.83 Toribio Fuel MD Electronically signed by Toribio Fuel MD Signature Date/Time:  01/13/2024/11:56:11 AM    Final    DG CHEST PORT 1 VIEW Result Date: 01/13/2024 CLINICAL DATA:  [id number] SOB (shortness of breath) [id number] EXAM: DG CHEST 1V PORT COMPARISON:  01/12/2024 FINDINGS: Elevation of the right hemidiaphragm. Progressive clearing in the right upper lung zone. Patchy airspace opacities in the right lung base, unchanged. Streaky left basilar airspace opacities, likely atelectasis, unchanged. The left costophrenic sulcus is excluded from the field of view. No pneumothorax. No cardiomegaly. Tortuous aorta with aortic atherosclerosis. No acute fracture or destructive lesions. Multilevel thoracic osteophytosis. IMPRESSION: 1. Similar patchy airspace disease in the right lung base with progressive clearing in the right upper lung zone. 2. Small left pleural effusion with streaky atelectasis in the left lung base. Electronically Signed   By: Rogelia Myers M.D.   On: 01/13/2024 08:47   Scheduled Meds:  amiodarone   200 mg Oral Daily   arformoterol   15 mcg Nebulization BID   aspirin  EC  81 mg Oral Daily   benzonatate   200 mg Oral TID   budesonide  (PULMICORT ) nebulizer solution  0.25 mg Nebulization BID   enoxaparin  (LOVENOX ) injection  40 mg Subcutaneous Q24H   furosemide   40 mg Intravenous BID   guaiFENesin   1,200 mg Oral BID   ipratropium  0.5 mg Nebulization TID   levalbuterol   0.63 mg Nebulization TID   methylPREDNISolone  (SOLU-MEDROL ) injection  60 mg Intravenous Q12H   metoprolol  succinate  12.5 mg Oral Daily   rosuvastatin   20 mg Oral Daily   [START ON 01/15/2024] sacubitril -valsartan   1 tablet Oral BID   tamsulosin   0.4 mg Oral QPC supper   Continuous Infusions:   LOS: 6 days   Alejandro Marker, DO [facility] Available via Epic secure chat 7am-7pm After these hours, please refer to coverage provider listed on [email or web address] 01/14/2024, 4:06 PM

## 2024-01-15 ENCOUNTER — Inpatient Hospital Stay (HOSPITAL_COMMUNITY)

## 2024-01-15 DIAGNOSIS — J9601 Acute respiratory failure with hypoxia: Secondary | ICD-10-CM | POA: Diagnosis not present

## 2024-01-15 DIAGNOSIS — J984 Other disorders of lung: Secondary | ICD-10-CM | POA: Diagnosis not present

## 2024-01-15 DIAGNOSIS — I5023 Acute on chronic systolic (congestive) heart failure: Secondary | ICD-10-CM | POA: Diagnosis not present

## 2024-01-15 DIAGNOSIS — J189 Pneumonia, unspecified organism: Secondary | ICD-10-CM | POA: Diagnosis not present

## 2024-01-15 DIAGNOSIS — I5043 Acute on chronic combined systolic (congestive) and diastolic (congestive) heart failure: Secondary | ICD-10-CM | POA: Diagnosis not present

## 2024-01-15 DIAGNOSIS — R0602 Shortness of breath: Secondary | ICD-10-CM | POA: Diagnosis not present

## 2024-01-15 DIAGNOSIS — I700 Atherosclerosis of aorta: Secondary | ICD-10-CM | POA: Diagnosis not present

## 2024-01-15 DIAGNOSIS — J90 Pleural effusion, not elsewhere classified: Secondary | ICD-10-CM | POA: Diagnosis not present

## 2024-01-15 DIAGNOSIS — I2510 Atherosclerotic heart disease of native coronary artery without angina pectoris: Secondary | ICD-10-CM | POA: Diagnosis not present

## 2024-01-15 LAB — CBC WITH DIFFERENTIAL/PLATELET
Abs Immature Granulocytes: 0.05 K/uL (ref 0.00–0.07)
Basophils Absolute: 0 K/uL (ref 0.0–0.1)
Basophils Relative: 0 %
Eosinophils Absolute: 0 K/uL (ref 0.0–0.5)
Eosinophils Relative: 0 %
HCT: 36.9 % — ABNORMAL LOW (ref 39.0–52.0)
Hemoglobin: 11.1 g/dL — ABNORMAL LOW (ref 13.0–17.0)
Immature Granulocytes: 1 %
Lymphocytes Relative: 5 %
Lymphs Abs: 0.5 K/uL — ABNORMAL LOW (ref 0.7–4.0)
MCH: 28 pg (ref 26.0–34.0)
MCHC: 30.1 g/dL (ref 30.0–36.0)
MCV: 92.9 fL (ref 80.0–100.0)
Monocytes Absolute: 0.2 K/uL (ref 0.1–1.0)
Monocytes Relative: 3 %
Neutro Abs: 8.5 K/uL — ABNORMAL HIGH (ref 1.7–7.7)
Neutrophils Relative %: 91 %
Platelets: 361 K/uL (ref 150–400)
RBC: 3.97 MIL/uL — ABNORMAL LOW (ref 4.22–5.81)
RDW: 16 % — ABNORMAL HIGH (ref 11.5–15.5)
WBC: 9.3 K/uL (ref 4.0–10.5)
nRBC: 0 % (ref 0.0–0.2)

## 2024-01-15 LAB — COMPREHENSIVE METABOLIC PANEL WITH GFR
ALT: 50 U/L — ABNORMAL HIGH (ref 0–44)
AST: 37 U/L (ref 15–41)
Albumin: 3.3 g/dL — ABNORMAL LOW (ref 3.5–5.0)
Alkaline Phosphatase: 62 U/L (ref 38–126)
Anion gap: 11 (ref 5–15)
BUN: 21 mg/dL (ref 8–23)
CO2: 37 mmol/L — ABNORMAL HIGH (ref 22–32)
Calcium: 8.9 mg/dL (ref 8.9–10.3)
Chloride: 88 mmol/L — ABNORMAL LOW (ref 98–111)
Creatinine, Ser: 0.75 mg/dL (ref 0.61–1.24)
GFR, Estimated: >60 mL/min (ref >60)
Glucose, Bld: 235 mg/dL — ABNORMAL HIGH (ref 70–99)
Potassium: 3.7 mmol/L (ref 3.5–5.1)
Sodium: 136 mmol/L (ref 135–145)
Total Bilirubin: 0.5 mg/dL (ref 0.0–1.2)
Total Protein: 6.3 g/dL — ABNORMAL LOW (ref 6.5–8.1)

## 2024-01-15 LAB — PHOSPHORUS: Phosphorus: 4.7 mg/dL — ABNORMAL HIGH (ref 2.5–4.6)

## 2024-01-15 LAB — MAGNESIUM: Magnesium: 2.2 mg/dL (ref 1.7–2.4)

## 2024-01-15 NOTE — Progress Notes (Signed)
 PROGRESS NOTE    Chase Combs  FMW:[id number] DOB: Mar 12, 1946 DOA: 01/07/2024 PCP: Wonda Worth SQUIBB, PA   Brief Narrative:  Chase Combs is a 78 y.o. 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  He was admitted to the hospital 5/17-10/27/2023 for opioid induced ileus versus SBO, possible colitis, and aspiration pneumonia.   Patient is presenting with a chief complaint of shortness of breath.  Reporting 3-day history of shortness of breath, cough, and rhinorrhea.  Also reporting swelling of his legs for the past 3 months which has gotten progressively worse.  He does not take any diuretics at home.  He is reporting mild substernal chest pressure unclear when this started.  Denies vomiting, abdominal pain, or diarrhea.   Over course of hospitalization, competed abx for PNA but continues to be SOB. Lasix  was resumed and now he is on BID.  Respiratory medications adjustments have been made. Repeat ECHO showed worsening LVEF and cardiology consulted and have adjusted some of his medications and he is now on amiodarone  and Entresto .  Plan is for right and left heart cath on Monday, 01/17/2024.  Assessment and Plan:  Pneumonia due to metapneumovirus with sepsis Acute Hypoxemic respiratory failure  in the setting of below -Right sided infiltrate (similar to last admission in May) - Procalcitonin < 0.10 - 8/16> Metapneumovirus positive  - antibiotics stopped  -Continue Oxygen, Guaifenesin / Dextromethorphan  and Tussionex- he is allergic to Codeine.  - added Mucomyst  nebs and chest PT; adjusted breathing treatments and will place on Xopenex , Atrovent , Brovana  and budesonide ; will also give him IV Solu-Medrol  60 mg every 12h - Does not wear oxygen at home will need to  continue to monitor respiratory status carefully and continue with flutter valve, incentive spirometry and guaifenesin  1200 mg twice daily -Repeat CXR this AM done and showed Similar patchy airspace disease in the right lung base with progressive clearing in the right upper lung zone. Small left pleural effusion with streaky atelectasis in the left lung base. -CT scan without contrast done yesterday and showed Airspace disease throughout the right lung with some improvement in the right upper lobe, unchanged in the right middle lobe and right lower lobe. Increasing airspace disease in the left lower lobe. Findings compatible with multifocal pneumonia. Small left pleural effusion and trace right pleural effusion, stable since prior study. Cardiomegaly, three-vessel coronary artery disease. Aortic Atherosclerosis -CTA PE Protocol as below ruled out PE -Obtain SLP evaluation and they are recommending regular diet with thin liquids.   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  -Checked BNP and went from 842.8 -> 1,747.3. Repeat Echocardiogram showed LVEF of 25-30% with severely decreased Fxn, LV Global Hypokinesis, and LVDP consistent w/ Grade 1 DD and RV was moderately reduced with Severely elevated Pulmonary Artery Systolic Pressure. -CXR as above - Checked CTA Chest PE protocol to assess Decreased RV Fxn to r/o PE and showed no evidence of significant pulmonary embolus but did show persistent diffuse right lung infiltrates with mild improvement since the prior study as well as persistent left pleural effusion and bilateral basilar consolidation or atelectasis which was similar.  Patient continues to  have cardiac enlargement and aortic atherosclerosis. -Continue Diuresis w/ IV Furosemide  40 mg BID  -Patient had a nuclear stress  test on 07/12/23 that was reassuring and negative for ischemia -Cardiology was consulted for further evaluation and Recc's and there are scheduling the patient for a R and L heart cath on Monday after diuresis and starting Entresto  low-dose -Irbesartan  300 mg po daily changed to Entresto  24-26 mg/tab. 1 tab p.o. twice daily.  Continue Toprol  12.5 mg po daily; also continue with aspirin  81 mg p.o. daily and rosuvastatin  20 mg p.o. daily -Troponin 51, likely due to demand ischemia -Strict I's and O's and daily weights;  Intake/Output Summary (Last 24 hours) at 01/15/2024 1401 Last data filed at 01/15/2024 1100 Gross per 24 hour  Intake 420 ml  Output 3850 ml  Net -3430 ml  -CXR this AM showed Stable linear opacities in the right mid lung and in both lung bases.  Small pleural effusions. -Will need Ambulatory home O2 screen and Repeat CXR prior to discharge  NSVT, PVCs and PACs: Continue w/ Metoprolol  Succinate 12.5 mg po Daily and correct electrolytes.  Patient had noted ectopy on telemetry with PVCs and NSVT.  Cardiology added low-dose Amiodarone  200 mg p.o. daily    Essential HTN: Held Amlodipine  for now as BP has been either normal or low; he was continued on metoprolol  succinate 12.5 mg p.o. daily; Irbesartan  300 mg daily is now being changed to a subacute show-valsartan  24-26 mg/tab. 1 tab p.o. twice daily.  Continue monitor blood pressures per protocol.  Last blood pressure reading was 150/90.  Hyperlipidemia: C/w Rosuvastatin  20 mg po daily   BPH: C/w Tamsulosin  0.4 mg po Daily    Normocytic Anemia: Hgb/Hct Trend has been fairly stable. Last Hgb/Hct was 11.1/36/9. Checked Anemia Panel noted an iron level of 31, UIBC of 308, TIBC of 339, saturation ration of 9%, ferritin level 113, folate level 14.3 and vitamin B12 1019. CTM for S/Sx of Bleeding; No overt bleeding noted. Repeat CBC in the AM  Hypoalbuminemia: Patient's Albumin Lvl is now 3.3. CTM and Trend and repeat CMP in the  AM  Overweight: Complicates overall prognosis and care. Estimated body mass index is 28.84 kg/m as calculated from the following:   Height as of this encounter: 6' (1.829 m).   Weight as of this encounter: 96.5 kg. Weight Loss and Dietary Counseling given   DVT prophylaxis: Place TED hose Start: 01/10/24 1026 enoxaparin  (LOVENOX ) injection 40 mg Start: 01/08/24 1000    Code Status: Full Code Family Communication: No family present @ bedside   Disposition Plan:  Level of care: Progressive Status is: Inpatient Remains inpatient appropriate because: Needs further clinical improvement and clearance by cardiology as she is getting a right and left heart cath on Monday   Consultants:  Cardiology  Procedures:  As delineated as above  Antimicrobials:  Anti-infectives (From admission, onward)    Start     Dose/Rate Route Frequency Ordered Stop   01/08/24 0115  ceFEPIme  (MAXIPIME ) 2 g in sodium chloride  0.9 % 100 mL IVPB  Status:  Discontinued        2 g 200 mL/hr over 30 Minutes Intravenous Every 8 hours 01/08/24 0114 01/08/24 1425   01/08/24 0115  linezolid  (ZYVOX ) IVPB 600 mg  Status:  Discontinued        600 mg 300 mL/hr over 60 Minutes Intravenous Every 12 hours 01/08/24 0114 01/08/24 1425       Subjective: Seen and examined at bedside and  still feels about the same and still feels a little short of breath with difficulty coughing up some of the sputum.  No nausea vomiting.  Was weaned off of supplemental oxygen but desaturated nocturnally yesterday.  Continues to feel fatigued and hopes he starts improving soon.  No other concerns or complaints at this time.  Objective: Vitals:   01/15/24 0530 01/15/24 0815 01/15/24 1229 01/15/24 1348  BP: (!) 135/96  (!) 150/90   Pulse: 99  (!) 41   Resp: 18  17   Temp: 97.9 F (36.6 C)  98.4 F (36.9 C)   TempSrc: Oral     SpO2: 97% 92% 97% 95%  Weight:      Height:        Intake/Output Summary (Last 24 hours) at 01/15/2024  1407 Last data filed at 01/15/2024 1100 Gross per 24 hour  Intake 420 ml  Output 3850 ml  Net -3430 ml   Filed Weights   01/13/24 0500 01/14/24 0500 01/15/24 0500  Weight: 96.8 kg 96.5 kg 96.5 kg   Examination: Physical Exam:  Constitutional: WN/WD, overweight elderly chronically ill-appearing Caucasian male in no acute distress Respiratory: Diminished to auscultation bilaterally with some coarse breath sounds and does have some slight rhonchi mild crackles but no appreciable wheezing or rales. Normal respiratory effort and patient is not tachypenic. No accessory muscle use.  Unlabored breathing.  Weaned off of supplemental oxygen with nasal cannula Cardiovascular: RRR, no murmurs / rubs / gallops. S1 and S2 auscultated. 1+ LE Edema  Abdomen: Soft, non-tender, Distended 2/2 body habitus. Bowel sounds positive.  GU: Deferred. Musculoskeletal: No clubbing / cyanosis of digits/nails. No joint deformity upper and lower extremities.  Skin: No rashes, lesions, ulcers on a limited skin evaluation No induration; Warm and dry.  Neurologic: CN 2-12 grossly intact with no focal deficits. Romberg sign and cerebellar reflexes not assessed.  Psychiatric: Normal judgment and insight. Alert and oriented x 3. Normal mood and appropriate affect.   Data Reviewed: I have personally reviewed following labs and imaging studies  CBC: Recent Labs  Lab 01/10/24 0445 01/12/24 0517 01/13/24 0537 01/14/24 0526 01/15/24 0633  WBC 7.0 7.3 4.3 8.3 9.3  NEUTROABS  --  6.0 3.8 7.5 8.5*  HGB 10.8* 10.9* 11.0* 11.1* 11.1*  HCT 35.2* 35.5* 37.6* 37.1* 36.9*  MCV 93.9 94.7 95.4 93.2 92.9  PLT 264 267 257 312 361   Basic Metabolic Panel: Recent Labs  Lab 01/11/24 0539 01/12/24 0517 01/13/24 0537 01/14/24 0526 01/15/24 0633  NA 134* 132* 135 135 136  K 4.0 3.7 4.4 3.7 3.7  CL 98 95* 95* 92* 88*  CO2 28 28 30 31  37*  GLUCOSE 121* 145* 200* 204* 235*  BUN 18 15 13 18 21   CREATININE 0.51* 0.48* 0.56*  0.66 0.75  CALCIUM  8.3* 8.2* 8.5* 8.8* 8.9  MG  --  2.0 2.1 2.1 2.2  PHOS  --  3.6 3.9 4.4 4.7*   GFR: Estimated Creatinine Clearance: 94.7 mL/min (by C-G formula based on SCr of 0.75 mg/dL). Liver Function Tests: Recent Labs  Lab 01/12/24 0517 01/13/24 0537 01/14/24 0526 01/15/24 0633  AST 38 26 26 37  ALT 41 31 35 50*  ALKPHOS 55 55 59 62  BILITOT 0.6 0.8 0.6 0.5  PROT 5.7* 5.8* 6.3* 6.3*  ALBUMIN 2.9* 2.8* 3.1* 3.3*   No results for input(s): LIPASE, AMYLASE in the last 168 hours. No results for input(s): AMMONIA in the last 168 hours. Coagulation Profile: No results  for input(s): INR, PROTIME in the last 168 hours. Cardiac Enzymes: No results for input(s): CKTOTAL, CKMB, CKMBINDEX, TROPONINI in the last 168 hours. BNP (last 3 results) Recent Labs    01/07/24 2301  PROBNP 15,245.0*   HbA1C: No results for input(s): HGBA1C in the last 72 hours. CBG: No results for input(s): GLUCAP in the last 168 hours. Lipid Profile: No results for input(s): CHOL, HDL, LDLCALC, TRIG, CHOLHDL, LDLDIRECT in the last 72 hours. Thyroid  Function Tests: No results for input(s): TSH, T4TOTAL, FREET4, T3FREE, THYROIDAB in the last 72 hours. Anemia Panel: Recent Labs    01/14/24 0526  VITAMINB12 1,019*  FOLATE 14.3  FERRITIN 113  TIBC 339  IRON 31*  RETICCTPCT 1.9   Sepsis Labs: No results for input(s): PROCALCITON, LATICACIDVEN in the last 168 hours.  Recent Results (from the past 240 hours)  Culture, blood (Routine X 2) w Reflex to ID Panel     Status: None   Collection Time: 01/08/24  1:10 AM   Specimen: BLOOD LEFT FOREARM  Result Value Ref Range Status   Specimen Description   Final    BLOOD LEFT FOREARM Performed at [facility], [address], [place], [place] [address]    Special Requests   Final    BOTTLES DRAWN AEROBIC AND ANAEROBIC Blood Culture adequate volume Performed at [facility], [address] [place], [place] [address]    Culture   Final    NO GROWTH 5 DAYS Performed at [facility], [address], [place], [place] [address]    Report Status 01/13/2024 FINAL  Final  Culture, blood (Routine X 2) w Reflex to ID Panel     Status: None   Collection Time: 01/08/24  1:20 AM   Specimen: BLOOD LEFT HAND  Result Value Ref Range Status   Specimen Description   Final    BLOOD LEFT HAND Performed at [facility], [address], [place], [place] [address]    Special Requests   Final    BOTTLES DRAWN AEROBIC AND ANAEROBIC Blood Culture results may not be optimal due to an inadequate volume of blood received in culture bottles Performed at [facility], [address], [place], [place] [address]    Culture   Final    NO GROWTH 5 DAYS Performed at [facility], [address], [place], [place] [address]    Report Status 01/13/2024 FINAL  Final  Resp panel by RT-PCR (RSV, Flu A&B, Covid) Anterior Nasal Swab     Status: None   Collection Time: 01/08/24  1:29 AM   Specimen: Anterior Nasal Swab  Result Value Ref Range Status   SARS Coronavirus 2 by RT PCR NEGATIVE NEGATIVE Final    Comment: (NOTE) SARS-CoV-2 target nucleic acids are NOT DETECTED.  The SARS-CoV-2 RNA is generally detectable in upper respiratory specimens during the acute phase of infection. The lowest concentration of SARS-CoV-2 viral copies this assay can detect is 138 copies/mL. A negative result does not preclude SARS-Cov-2 infection and should not be used as the sole basis for treatment or other patient management decisions. A negative result may occur with  improper specimen collection/handling, submission of specimen other than nasopharyngeal swab, presence of viral mutation(s) within the areas targeted by this assay, and inadequate number of viral copies(<138 copies/mL). A negative result must be combined with clinical  observations, patient history, and epidemiological information. The expected result is Negative.  Fact Sheet for Patients:  BloggerCourse.com  Fact Sheet for Healthcare Providers:  SeriousBroker.it  This test is no t yet approved or cleared by the [place]  FDA and  has been authorized for detection and/or diagnosis of SARS-CoV-2 by FDA under an Emergency Use Authorization (EUA). This EUA will remain  in effect (meaning this test can be used) for the duration of the COVID-19 declaration under Section 564(b)(1) of the Act, 21 U.S.C.section 360bbb-3(b)(1), unless the authorization is terminated  or revoked sooner.       Influenza A by PCR NEGATIVE NEGATIVE Final   Influenza B by PCR NEGATIVE NEGATIVE Final    Comment: (NOTE) The Xpert Xpress SARS-CoV-2/FLU/RSV plus assay is intended as an aid in the diagnosis of influenza from Nasopharyngeal swab specimens and should not be used as a sole basis for treatment. Nasal washings and aspirates are unacceptable for Xpert Xpress SARS-CoV-2/FLU/RSV testing.  Fact Sheet for Patients: BloggerCourse.com  Fact Sheet for Healthcare Providers: SeriousBroker.it  This test is not yet approved or cleared by the [place]  FDA and has been authorized for detection and/or diagnosis of SARS-CoV-2 by FDA under an Emergency Use Authorization (EUA). This EUA will remain in effect (meaning this test can be used) for the duration of the COVID-19 declaration under Section 564(b)(1) of the Act, 21 U.S.C. section 360bbb-3(b)(1), unless the authorization is terminated or revoked.     Resp Syncytial Virus by PCR NEGATIVE NEGATIVE Final    Comment: (NOTE) Fact Sheet for Patients: BloggerCourse.com  Fact Sheet for Healthcare Providers: SeriousBroker.it  This test is not yet approved or cleared by  the [place]  FDA and has been authorized for detection and/or diagnosis of SARS-CoV-2 by FDA under an Emergency Use Authorization (EUA). This EUA will remain in effect (meaning this test can be used) for the duration of the COVID-19 declaration under Section 564(b)(1) of the Act, 21 U.S.C. section 360bbb-3(b)(1), unless the authorization is terminated or revoked.  Performed at [facility], [address] [place], [place] [address]   Respiratory (~20 pathogens) panel by PCR     Status: Abnormal   Collection Time: 01/08/24  9:54 AM   Specimen: Nasopharyngeal Swab; Respiratory  Result Value Ref Range Status   Adenovirus NOT DETECTED NOT DETECTED Final   Coronavirus 229E NOT DETECTED NOT DETECTED Final    Comment: (NOTE) The Coronavirus on the Respiratory Panel, DOES NOT test for the novel  Coronavirus (2019 nCoV)    Coronavirus HKU1 NOT DETECTED NOT DETECTED Final   Coronavirus NL63 NOT DETECTED NOT DETECTED Final   Coronavirus OC43 NOT DETECTED NOT DETECTED Final   Metapneumovirus DETECTED (A) NOT DETECTED Final   Rhinovirus / Enterovirus NOT DETECTED NOT DETECTED Final   Influenza A NOT DETECTED NOT DETECTED Final   Influenza B NOT DETECTED NOT DETECTED Final   Parainfluenza Virus 1 NOT DETECTED NOT DETECTED Final   Parainfluenza Virus 2 NOT DETECTED NOT DETECTED Final   Parainfluenza Virus 3 NOT DETECTED NOT DETECTED Final   Parainfluenza Virus 4 NOT DETECTED NOT DETECTED Final   Respiratory Syncytial Virus NOT DETECTED NOT DETECTED Final   Bordetella pertussis NOT DETECTED NOT DETECTED Final   Bordetella Parapertussis NOT DETECTED NOT DETECTED Final   Chlamydophila pneumoniae NOT DETECTED NOT DETECTED Final   Mycoplasma pneumoniae NOT DETECTED NOT DETECTED Final    Comment: Performed at [facility], [address], [place], [place] [address]    Radiology Studies: DG CHEST PORT 1 VIEW Result Date: 01/15/2024 EXAM: 1 VIEW XRAY OF THE CHEST  01/15/2024 05:50:00 AM COMPARISON: None available. CLINICAL HISTORY: [id number] SOB (shortness of breath) [id number]. Fever sob FINDINGS: LUNGS AND PLEURA: Stable linear opacities in the right mid lung and in both lung bases. Small pleural effusions. HEART AND MEDIASTINUM: No acute abnormality of the cardiac and mediastinal silhouettes. Aortic atherosclerosis (ICD10-i70.0). BONES AND SOFT TISSUES: No acute osseous abnormality. IMPRESSION: 1. Stable linear opacities in the right mid lung and in both lung bases. 2. Small pleural effusions. Electronically signed by: Katheleen Faes MD 01/15/2024 09:47 AM EDT RP Workstation: [id number]   CT Angio Chest Pulmonary Embolism (PE) W or WO Contrast Result Date: 01/14/2024 CLINICAL DATA:  Chronic dyspnea. Chest wall or pleural disease suspected. Shortness of breath and chest pain. EXAM: CT ANGIOGRAPHY CHEST WITH CONTRAST TECHNIQUE: Multidetector CT imaging of the chest was performed using the standard protocol during bolus administration of intravenous contrast. Multiplanar CT image reconstructions and MIPs were obtained to evaluate the vascular anatomy. RADIATION DOSE REDUCTION: This exam was performed according to the departmental dose-optimization program which includes automated exposure control, adjustment of the mA and/or kV according to patient size and/or use of iterative reconstruction technique. CONTRAST:  75mL OMNIPAQUE  IOHEXOL  350 MG/ML SOLN COMPARISON:  Chest radiograph 01/14/2024.  CT chest 01/12/2024 FINDINGS: Cardiovascular: Cardiac enlargement. No pericardial effusions. Normal caliber thoracic aorta. Calcification of the aorta and coronary arteries. Central pulmonary arteries are well opacified. No evidence of significant pulmonary embolus. Mediastinum/Nodes: Thyroid  gland is unremarkable. Esophagus is decompressed. Scattered mediastinal lymph nodes are not pathologically enlarged, likely reactive. Lungs/Pleura: Small left pleural effusion. This is similar to prior  study. Atelectasis or consolidation seen in both lung bases also similar. Patchy airspace infiltration throughout the right lung remains present but is mildly improved since the prior CT. Motion artifact limits examination. No pneumothorax. Upper Abdomen: No acute abnormalities. Musculoskeletal: Degenerative changes in the spine and shoulders. No acute bony abnormalities. Review of the MIP images confirms the above findings. IMPRESSION: 1. Persistent diffuse right lung infiltrates with mild improvement since prior study. 2. Persistent left pleural effusion and bilateral basilar consolidation or atelectasis, similar to prior study. 3. Cardiac enlargement. 4. Aortic atherosclerosis. 5. No evidence of significant pulmonary embolus. Electronically Signed   By: Elsie Gravely M.D.   On: 01/14/2024 18:12   DG CHEST PORT 1 VIEW Result Date: 01/14/2024 CLINICAL DATA:  Shortness of breath. EXAM: PORTABLE CHEST 1 VIEW COMPARISON:  01/13/2024 FINDINGS: Stable asymmetric elevation right hemidiaphragm with similar right base streaky opacity compatible with atelectasis or infiltrate. The cardio pericardial silhouette is enlarged. No acute bony abnormality. Degenerative changes noted in the right shoulder. IMPRESSION: Similar right base atelectasis or infiltrate. Electronically Signed   By: Camellia Candle M.D.   On: 01/14/2024 09:21   Scheduled Meds:  amiodarone   200 mg Oral Daily   arformoterol   15 mcg Nebulization BID   aspirin  EC  81 mg Oral Daily   benzonatate   200 mg Oral TID   budesonide  (PULMICORT ) nebulizer solution  0.25 mg Nebulization BID   enoxaparin  (LOVENOX ) injection  40 mg Subcutaneous Q24H   furosemide   40 mg Intravenous BID   guaiFENesin   1,200 mg Oral BID   ipratropium  0.5 mg Nebulization TID   levalbuterol   0.63 mg Nebulization TID   methylPREDNISolone  (SOLU-MEDROL ) injection  60 mg Intravenous Q12H   metoprolol  succinate  12.5 mg Oral Daily   rosuvastatin   20 mg Oral Daily    sacubitril -valsartan   1 tablet Oral BID   tamsulosin   0.4 mg Oral QPC supper  Continuous Infusions:   LOS: 7 days   Alejandro Marker, DO [facility] Available via Epic secure chat 7am-7pm After these hours, please refer to coverage provider listed on [email or web address] 01/15/2024, 2:07 PM

## 2024-01-15 NOTE — Evaluation (Signed)
 Physical Therapy Evaluation Patient Details Name: Chase Combs MRN: [id number] DOB: [birth year] Today's Date: 01/15/2024  History of Present Illness  [name] is a 78 yr old male admitted to the hospital with shortness of breath, cough, and rhinorrhea. He was found to have acute hypoxemic respiratory failure due to CAP. PMH: CAD, MI s/p stent x2, HTN. HLD, prostate CA, OSA, prediabetes, L TKA in May 2025, chronic anemia  Clinical Impression  Pt admitted with above diagnosis. Pt agreeable to session, in bed when PT arrives. He is mod I for bed mobility and small mobility tasks using RW, supervision with longer distances, able to complete 226ft amb this session on RA. Pt feels that he is 80% of his baseline with endurance deficits noted. Returns to bed and O2 reapplied. Pt currently with functional limitations due to the deficits listed below (see PT Problem List).     Post activity vitals: BP 142/93, 93-97% O2 on RA, 109bpm       If plan is discharge home, recommend the following: Assistance with cooking/housework;Assist for transportation   Can travel by private vehicle        Equipment Recommendations None recommended by PT  Recommendations for Other Services       Functional Status Assessment Patient has had a recent decline in their functional status and demonstrates the ability to make significant improvements in function in a reasonable and predictable amount of time.     Precautions / Restrictions Precautions Precautions: Fall Recall of Precautions/Restrictions: Intact Restrictions Weight Bearing Restrictions Per Provider Order: No Other Position/Activity Restrictions: monitor O2      Mobility  Bed Mobility Overal bed mobility: Modified Independent                  Transfers Overall transfer level: Modified independent Equipment used: Rolling walker (2 wheels) Transfers: Sit to/from Stand Sit to Stand: Modified independent (Device/Increase time)                 Ambulation/Gait Ambulation/Gait assistance: Supervision Gait Distance (Feet): 200 Feet Assistive device: Rolling walker (2 wheels) Gait Pattern/deviations: Step-through pattern Gait velocity: dec     General Gait Details: Pt amb with decreased cadence and velocity, uses RW x244ft with cues on directional changes to keep walker on the ground. Pt able to complete functional distances related to home setup with good safety and tolerance  Stairs            Wheelchair Mobility     Tilt Bed    Modified Rankin (Stroke Patients Only)       Balance Overall balance assessment: Needs assistance Sitting-balance support: Feet supported Sitting balance-Leahy Scale: Good     Standing balance support: Reliant on assistive device for balance, During functional activity Standing balance-Leahy Scale: Good                               Pertinent Vitals/Pain Pain Assessment Pain Assessment: No/denies pain    Home Living Family/patient expects to be discharged to:: Private residence Living Arrangements: Spouse/significant other Available Help at Discharge: Family Type of Home: House Home Access: Ramped entrance       Home Layout: One level Home Equipment: Agricultural consultant (2 wheels);Rollator (4 wheels);Cane - single point;Shower seat;Lift chair;Wheelchair - power;Grab bars - tub/shower;Grab bars - toilet      Prior Function Prior Level of Function : Driving  Mobility Comments: He used a rollator for longer distances. ADLs Comments: Modified independent to independent with ADLs. His spouse managed the cooking and cleaning.     Extremity/Trunk Assessment   Upper Extremity Assessment Upper Extremity Assessment: Defer to OT evaluation    Lower Extremity Assessment Lower Extremity Assessment: Generalized weakness       Communication   Communication Communication: No apparent difficulties    Cognition Arousal: Alert Behavior  During Therapy: WFL for tasks assessed/performed                             Following commands: Intact       Cueing Cueing Techniques: Verbal cues     General Comments General comments (skin integrity, edema, etc.): Pt reports 80% of baseline function limited by endurance/activity tolerance    Exercises     Assessment/Plan    PT Assessment Patient does not need any further PT services  PT Problem List         PT Treatment Interventions      PT Goals (Current goals can be found in the Care Plan section)  Acute Rehab PT Goals Patient Stated Goal: return home and improve endurance PT Goal Formulation: With patient Time For Goal Achievement: 01/29/24 Potential to Achieve Goals: Good    Frequency       Co-evaluation               AM-PAC PT 6 Clicks Mobility  Outcome Measure Help needed turning from your back to your side while in a flat bed without using bedrails?: None Help needed moving from lying on your back to sitting on the side of a flat bed without using bedrails?: None Help needed moving to and from a bed to a chair (including a wheelchair)?: None Help needed standing up from a chair using your arms (e.g., wheelchair or bedside chair)?: None Help needed to walk in hospital room?: None Help needed climbing 3-5 steps with a railing? : A Little 6 Click Score: 23    End of Session Equipment Utilized During Treatment: Gait belt Activity Tolerance: Patient tolerated treatment well Patient left: in bed;with call bell/phone within reach Nurse Communication: Mobility status PT Visit Diagnosis: Muscle weakness (generalized) (M62.81);Difficulty in walking, not elsewhere classified (R26.2)    Time: 1420-1440 PT Time Calculation (min) (ACUTE ONLY): 20 min   Charges:   PT Evaluation $PT Eval Low Complexity: 1 Low   PT General Charges $$ ACUTE PT VISIT: 1 Visit         Chase, PT Acute Rehabilitation Services Office: [phone] 01/15/2024   Chase Combs 01/15/2024, 2:49 PM

## 2024-01-15 NOTE — Progress Notes (Signed)
 Pt O2 sat has been sustaining in 86-89% at rest and on room air. Attached to o2 @1  L/M via [place]. O2 sat went to 92%.

## 2024-01-15 NOTE — Progress Notes (Signed)
 Cardiologist:  Anner  Subjective:   No angina Breathing better   Objective:  Vitals:   01/14/24 2230 01/15/24 0500 01/15/24 0530 01/15/24 0815  BP:   (!) 135/96   Pulse:   99   Resp: 20  18   Temp:   97.9 F (36.6 C)   TempSrc:   Oral   SpO2:   97% 92%  Weight:  96.5 kg    Height:        Intake/Output from previous day:  Intake/Output Summary (Last 24 hours) at 01/15/2024 0933 Last data filed at 01/15/2024 0534 Gross per 24 hour  Intake 420 ml  Output 4250 ml  Net -3830 ml    Physical Exam:  Elderly white male Abnormal lung exam egophony right lung with rhonchi And exp wheezing  Abdomen benign Post right TKR Plus 2 bilateral edema No murmur  Lab Results: Basic Metabolic Panel: Recent Labs    01/14/24 0526 01/15/24 0633  NA 135 136  K 3.7 3.7  CL 92* 88*  CO2 31 37*  GLUCOSE 204* 235*  BUN 18 21  CREATININE 0.66 0.75  CALCIUM  8.8* 8.9  MG 2.1 2.2  PHOS 4.4 4.7*   Liver Function Tests: Recent Labs    01/14/24 0526 01/15/24 0633  AST 26 37  ALT 35 50*  ALKPHOS 59 62  BILITOT 0.6 0.5  PROT 6.3* 6.3*  ALBUMIN 3.1* 3.3*   CBC: Recent Labs    01/14/24 0526 01/15/24 0633  WBC 8.3 9.3  NEUTROABS 7.5 8.5*  HGB 11.1* 11.1*  HCT 37.1* 36.9*  MCV 93.2 92.9  PLT 312 361    Anemia Panel: Recent Labs    01/14/24 0526  VITAMINB12 1,019*  FOLATE 14.3  FERRITIN 113  TIBC 339  IRON 31*  RETICCTPCT 1.9    Imaging: CT Angio Chest Pulmonary Embolism (PE) W or WO Contrast Result Date: 01/14/2024 CLINICAL DATA:  Chronic dyspnea. Chest wall or pleural disease suspected. Shortness of breath and chest pain. EXAM: CT ANGIOGRAPHY CHEST WITH CONTRAST TECHNIQUE: Multidetector CT imaging of the chest was performed using the standard protocol during bolus administration of intravenous contrast. Multiplanar CT image reconstructions and MIPs were obtained to evaluate the vascular anatomy. RADIATION DOSE REDUCTION: This exam was performed according to  the departmental dose-optimization program which includes automated exposure control, adjustment of the mA and/or kV according to patient size and/or use of iterative reconstruction technique. CONTRAST:  75mL OMNIPAQUE  IOHEXOL  350 MG/ML SOLN COMPARISON:  Chest radiograph 01/14/2024.  CT chest 01/12/2024 FINDINGS: Cardiovascular: Cardiac enlargement. No pericardial effusions. Normal caliber thoracic aorta. Calcification of the aorta and coronary arteries. Central pulmonary arteries are well opacified. No evidence of significant pulmonary embolus. Mediastinum/Nodes: Thyroid  gland is unremarkable. Esophagus is decompressed. Scattered mediastinal lymph nodes are not pathologically enlarged, likely reactive. Lungs/Pleura: Small left pleural effusion. This is similar to prior study. Atelectasis or consolidation seen in both lung bases also similar. Patchy airspace infiltration throughout the right lung remains present but is mildly improved since the prior CT. Motion artifact limits examination. No pneumothorax. Upper Abdomen: No acute abnormalities. Musculoskeletal: Degenerative changes in the spine and shoulders. No acute bony abnormalities. Review of the MIP images confirms the above findings. IMPRESSION: 1. Persistent diffuse right lung infiltrates with mild improvement since prior study. 2. Persistent left pleural effusion and bilateral basilar consolidation or atelectasis, similar to prior study. 3. Cardiac enlargement. 4. Aortic atherosclerosis. 5. No evidence of significant pulmonary embolus. Electronically Signed   By: Elsie Gravely  M.D.   On: 01/14/2024 18:12   DG CHEST PORT 1 VIEW Result Date: 01/14/2024 CLINICAL DATA:  Shortness of breath. EXAM: PORTABLE CHEST 1 VIEW COMPARISON:  01/13/2024 FINDINGS: Stable asymmetric elevation right hemidiaphragm with similar right base streaky opacity compatible with atelectasis or infiltrate. The cardio pericardial silhouette is enlarged. No acute bony abnormality.  Degenerative changes noted in the right shoulder. IMPRESSION: Similar right base atelectasis or infiltrate. Electronically Signed   By: Camellia Candle M.D.   On: 01/14/2024 09:21   ECHOCARDIOGRAM COMPLETE Result Date: 01/13/2024    ECHOCARDIOGRAM REPORT   Patient Name:   Chase Combs Date of Exam: 01/13/2024 Medical Rec #:  [id number]      Height:       72.0 in Accession #:    [id number]     Weight:       213.4 lb Date of Birth:  [birth year]      BSA:          2.190 m Patient Age:    78 years       BP:           134/89 mmHg Patient Gender: M              HR:           104 bpm. Exam Location:  Inpatient Procedure: 2D Echo, Color Doppler and Cardiac Doppler (Both Spectral and Color            Flow Doppler were utilized during procedure). Indications:    Dyspnea  History:        Patient has prior history of Echocardiogram examinations, most                 recent 07/12/2023. CAD.  Sonographer:    Benard Stallion Referring Phys: [id number] OMAIR LATIF [facility] IMPRESSIONS  1. Left ventricular ejection fraction, by estimation, is 25 to 30%. The left ventricle has severely decreased function. The left ventricle demonstrates global hypokinesis. The left ventricular internal cavity size was moderately dilated. Left ventricular diastolic parameters are consistent with Grade I diastolic dysfunction (impaired relaxation).  2. Right ventricular systolic function is moderately reduced. The right ventricular size is normal. There is severely elevated pulmonary artery systolic pressure.  3. Left atrial size was moderately dilated.  4. Right atrial size was mildly dilated.  5. The mitral valve is normal in structure. Mild mitral valve regurgitation. No evidence of mitral stenosis.  6. The aortic valve is normal in structure. Aortic valve regurgitation is mild. No aortic stenosis is present.  7. The inferior vena cava is dilated in size with <50% respiratory variability, suggesting right atrial pressure of 15 mmHg. FINDINGS  Left  Ventricle: Left ventricular ejection fraction, by estimation, is 25 to 30%. The left ventricle has severely decreased function. The left ventricle demonstrates global hypokinesis. The left ventricular internal cavity size was moderately dilated. There is no left ventricular hypertrophy. Abnormal (paradoxical) septal motion, consistent with left bundle branch block. Left ventricular diastolic function could not be evaluated due to atrial fibrillation. Left ventricular diastolic parameters are consistent with Grade I diastolic dysfunction (impaired relaxation). Right Ventricle: The right ventricular size is normal. No increase in right ventricular wall thickness. Right ventricular systolic function is moderately reduced. There is severely elevated pulmonary artery systolic pressure. The tricuspid regurgitant velocity is 3.65 m/s, and with an assumed right atrial pressure of 15 mmHg, the estimated right ventricular systolic pressure is 68.3 mmHg. Left Atrium: Left atrial size was moderately dilated.  Right Atrium: Right atrial size was mildly dilated. Pericardium: There is no evidence of pericardial effusion. Mitral Valve: The mitral valve is normal in structure. Mild mitral valve regurgitation. No evidence of mitral valve stenosis. Tricuspid Valve: The tricuspid valve is normal in structure. Tricuspid valve regurgitation is mild . No evidence of tricuspid stenosis. Aortic Valve: The aortic valve is normal in structure. Aortic valve regurgitation is mild. No aortic stenosis is present. Aortic valve mean gradient measures 4.5 mmHg. Aortic valve peak gradient measures 8.1 mmHg. Aortic valve area, by VTI measures 2.86 cm. Pulmonic Valve: The pulmonic valve was normal in structure. Pulmonic valve regurgitation is not visualized. No evidence of pulmonic stenosis. Aorta: The aortic root is normal in size and structure. Venous: The inferior vena cava is dilated in size with less than 50% respiratory variability, suggesting  right atrial pressure of 15 mmHg. IAS/Shunts: No atrial level shunt detected by color flow Doppler.  LEFT VENTRICLE PLAX 2D LVIDd:         6.90 cm      Diastology LVIDs:         5.60 cm      LV e' medial:    5.77 cm/s LV PW:         1.00 cm      LV E/e' medial:  19.1 LV IVS:        1.00 cm      LV e' lateral:   7.40 cm/s LVOT diam:     2.30 cm      LV E/e' lateral: 14.9 LV SV:         64 LV SV Index:   29 LVOT Area:     4.15 cm  LV Volumes (MOD) LV vol d, MOD A2C: 207.0 ml LV vol d, MOD A4C: 178.0 ml LV vol s, MOD A2C: 144.0 ml LV vol s, MOD A4C: 113.0 ml LV SV MOD A2C:     63.0 ml LV SV MOD A4C:     178.0 ml LV SV MOD BP:      64.2 ml RIGHT VENTRICLE RV Basal diam:  4.50 cm RV Mid diam:    4.00 cm RV S prime:     14.60 cm/s TAPSE (M-mode): 2.5 cm LEFT ATRIUM              Index        RIGHT ATRIUM           Index LA diam:        4.60 cm  2.10 cm/m   RA Area:     16.80 cm LA Vol (A2C):   108.0 ml 49.31 ml/m  RA Volume:   42.50 ml  19.41 ml/m LA Vol (A4C):   82.7 ml  37.76 ml/m LA Biplane Vol: 100.0 ml 45.66 ml/m  AORTIC VALVE AV Area (Vmax):    2.73 cm AV Area (Vmean):   2.83 cm AV Area (VTI):     2.86 cm AV Vmax:           142.50 cm/s AV Vmean:          98.800 cm/s AV VTI:            0.224 m AV Peak Grad:      8.1 mmHg AV Mean Grad:      4.5 mmHg LVOT Vmax:         93.60 cm/s LVOT Vmean:        67.300 cm/s LVOT VTI:  0.154 m LVOT/AV VTI ratio: 0.69  AORTA Ao Root diam: 3.20 cm Ao Asc diam:  3.65 cm MITRAL VALVE                TRICUSPID VALVE MV Area (PHT): 6.37 cm     TR Peak grad:   53.3 mmHg MV Decel Time: 119 msec     TR Vmax:        365.00 cm/s MR Peak grad: 75.3 mmHg MR Vmax:      434.00 cm/s   SHUNTS MV E velocity: 110.00 cm/s  Systemic VTI:  0.15 m MV A velocity: 60.20 cm/s   Systemic Diam: 2.30 cm MV E/A ratio:  1.83 Toribio Fuel MD Electronically signed by Toribio Fuel MD Signature Date/Time: 01/13/2024/11:56:11 AM    Final     Cardiac Studies:  ECG: ST HR 116 PAC   Telemetry:   SR PAC/PVC  Echo: EF 25-30% moderate RV reduction mild MR mild AR  Medications:    amiodarone   200 mg Oral Daily   arformoterol   15 mcg Nebulization BID   aspirin  EC  81 mg Oral Daily   benzonatate   200 mg Oral TID   budesonide  (PULMICORT ) nebulizer solution  0.25 mg Nebulization BID   enoxaparin  (LOVENOX ) injection  40 mg Subcutaneous Q24H   furosemide   40 mg Intravenous BID   guaiFENesin   1,200 mg Oral BID   ipratropium  0.5 mg Nebulization TID   levalbuterol   0.63 mg Nebulization TID   methylPREDNISolone  (SOLU-MEDROL ) injection  60 mg Intravenous Q12H   metoprolol  succinate  12.5 mg Oral Daily   rosuvastatin   20 mg Oral Daily   sacubitril -valsartan   1 tablet Oral BID   tamsulosin   0.4 mg Oral QPC supper      Assessment/Plan:  Acute on chronic systolic heart failure Pulmonary hypertension, reduced RV function - historically with LVEF 40-45% range, most recent echo 06/2023 - presented with hypoxic respiratory failure, dyspnea, and LE edema - echo now with further reduction in LVEF to 25-30% along with incomplete description of chest pain - on 12.5 mg toprol  - will monitor output with diuresis - change ARB to entresto   - albumin 2.8-3.1 - on 40 mg IV lasix  BID - continue this - given hx of CAD, will schedule for right and left heart cath on Monday after diuresis    Shared Decision Making/Informed Consent The risks [stroke (1 in 1000), death (1 in 1000), kidney failure [usually temporary] (1 in 500), bleeding (1 in 200), allergic reaction [possibly serious] (1 in 200)], benefits (diagnostic support and management of coronary artery disease) and alternatives of a cardiac catheterization were discussed in detail with [name] and she is willing to proceed.      NSVT, PVCs, PACs - ectopy on telemetry with PVCs and NSVT - Mg 2.2 this morning, K 3.7 - receiving 12.5 mg toprol    - add low dose amiodarone  PO     Metapneumovirus Acute hypoxic respiratory failure - Receiving  breathing treatments -- remains on O2 2 L - CXR still with infiltrate right lung and abnormal exam      CAD - prior BMS stenting to RCA with restenosis requiring thrombectomy, last intervention 2010 - recent 07/12/23  nuclear stress test reassuring, negative for ischemia - given further reduction in LVEF, chest pain, and mild troponin elevation, will plan for R/L HC on Monday - continue statin - see above regarding cath on Monday     Hypertension - managed in the context of CHF  Maude Emmer 01/15/2024, 9:33 AM

## 2024-01-16 ENCOUNTER — Inpatient Hospital Stay (HOSPITAL_COMMUNITY)

## 2024-01-16 DIAGNOSIS — I493 Ventricular premature depolarization: Secondary | ICD-10-CM | POA: Diagnosis not present

## 2024-01-16 DIAGNOSIS — I4719 Other supraventricular tachycardia: Secondary | ICD-10-CM | POA: Diagnosis not present

## 2024-01-16 DIAGNOSIS — J189 Pneumonia, unspecified organism: Secondary | ICD-10-CM | POA: Diagnosis not present

## 2024-01-16 DIAGNOSIS — I2510 Atherosclerotic heart disease of native coronary artery without angina pectoris: Secondary | ICD-10-CM | POA: Diagnosis not present

## 2024-01-16 DIAGNOSIS — I5043 Acute on chronic combined systolic (congestive) and diastolic (congestive) heart failure: Secondary | ICD-10-CM | POA: Diagnosis not present

## 2024-01-16 DIAGNOSIS — J9601 Acute respiratory failure with hypoxia: Secondary | ICD-10-CM | POA: Diagnosis not present

## 2024-01-16 DIAGNOSIS — J9691 Respiratory failure, unspecified with hypoxia: Secondary | ICD-10-CM | POA: Diagnosis not present

## 2024-01-16 LAB — COMPREHENSIVE METABOLIC PANEL WITH GFR
ALT: 60 U/L — ABNORMAL HIGH (ref 0–44)
AST: 35 U/L (ref 15–41)
Albumin: 2.8 g/dL — ABNORMAL LOW (ref 3.5–5.0)
Alkaline Phosphatase: 51 U/L (ref 38–126)
Anion gap: 11 (ref 5–15)
BUN: 20 mg/dL (ref 8–23)
CO2: 35 mmol/L — ABNORMAL HIGH (ref 22–32)
Calcium: 8.4 mg/dL — ABNORMAL LOW (ref 8.9–10.3)
Chloride: 88 mmol/L — ABNORMAL LOW (ref 98–111)
Creatinine, Ser: 0.71 mg/dL (ref 0.61–1.24)
GFR, Estimated: >60 mL/min (ref >60)
Glucose, Bld: 243 mg/dL — ABNORMAL HIGH (ref 70–99)
Potassium: 3.7 mmol/L (ref 3.5–5.1)
Sodium: 134 mmol/L — ABNORMAL LOW (ref 135–145)
Total Bilirubin: 0.6 mg/dL (ref 0.0–1.2)
Total Protein: 5.6 g/dL — ABNORMAL LOW (ref 6.5–8.1)

## 2024-01-16 LAB — CBC WITH DIFFERENTIAL/PLATELET
Abs Immature Granulocytes: 0.07 K/uL (ref 0.00–0.07)
Basophils Absolute: 0 K/uL (ref 0.0–0.1)
Basophils Relative: 0 %
Eosinophils Absolute: 0 K/uL (ref 0.0–0.5)
Eosinophils Relative: 0 %
HCT: 37.1 % — ABNORMAL LOW (ref 39.0–52.0)
Hemoglobin: 11.5 g/dL — ABNORMAL LOW (ref 13.0–17.0)
Immature Granulocytes: 1 %
Lymphocytes Relative: 5 %
Lymphs Abs: 0.5 K/uL — ABNORMAL LOW (ref 0.7–4.0)
MCH: 28.5 pg (ref 26.0–34.0)
MCHC: 31 g/dL (ref 30.0–36.0)
MCV: 91.8 fL (ref 80.0–100.0)
Monocytes Absolute: 0.3 K/uL (ref 0.1–1.0)
Monocytes Relative: 3 %
Neutro Abs: 8.5 K/uL — ABNORMAL HIGH (ref 1.7–7.7)
Neutrophils Relative %: 91 %
Platelets: 349 K/uL (ref 150–400)
RBC: 4.04 MIL/uL — ABNORMAL LOW (ref 4.22–5.81)
RDW: 15.9 % — ABNORMAL HIGH (ref 11.5–15.5)
WBC: 9.4 K/uL (ref 4.0–10.5)
nRBC: 0 % (ref 0.0–0.2)

## 2024-01-16 LAB — PHOSPHORUS: Phosphorus: 3.6 mg/dL (ref 2.5–4.6)

## 2024-01-16 LAB — MAGNESIUM: Magnesium: 2.1 mg/dL (ref 1.7–2.4)

## 2024-01-16 MED ORDER — METHYLPREDNISOLONE SODIUM SUCC 40 MG IJ SOLR
40.0000 mg | Freq: Two times a day (BID) | INTRAMUSCULAR | Status: DC
  Administered 2024-01-16: 40 mg via INTRAVENOUS
  Filled 2024-01-16: qty 1

## 2024-01-16 MED ORDER — METOPROLOL SUCCINATE ER 25 MG PO TB24
25.0000 mg | ORAL_TABLET | Freq: Every day | ORAL | Status: DC
  Administered 2024-01-16 – 2024-01-19 (×3): 25 mg via ORAL
  Filled 2024-01-16 (×4): qty 1

## 2024-01-16 NOTE — Progress Notes (Signed)
 PROGRESS NOTE    Chase Combs  FMW:[id number] DOB: 17-Mar-1946 DOA: 01/07/2024 PCP: Wonda Worth SQUIBB, PA   Brief Narrative:  Chase Combs is a 78 y.o. 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  He was admitted to the hospital 5/17-10/27/2023 for opioid induced ileus versus SBO, possible colitis, and aspiration pneumonia.   Patient is presenting with a chief complaint of shortness of breath.  Reporting 3-day history of shortness of breath, cough, and rhinorrhea.  Also was reporting swelling of his legs for the past 3 months which has gotten progressively worse.  He does not take any diuretics at home.  He is reporting mild substernal chest pressure unclear when this started.  Denies vomiting, abdominal pain, or diarrhea.   Over course of hospitalization, He was found to have MetaPneumovirus but continued to be SOB. Lasix  was resumed and now he is on BID IV 40 mg.  Respiratory medications adjustments have been made. Repeat ECHO was done and showed worsening LVEF. Cardiology consulted and have adjusted some of his medications and he is now on amiodarone  and Entresto .  Plan is for RIGHT and LEFT heart cath on Monday, 01/17/2024 in the afternoon.  Assessment and Plan:  Pneumonia due to metapneumovirus with sepsis Acute Hypoxemic respiratory failure  in the setting of below -Right sided infiltrate (similar to last admission in May) - Procalcitonin < 0.10 - 8/16> Metapneumovirus positive  - antibiotics stopped  -Continue Oxygen, Guaifenesin / Dextromethorphan  and Tussionex- he is allergic to Codeine.  - added Mucomyst  nebs and chest PT; adjusted breathing treatments and will place on Xopenex , Atrovent , Brovana  and budesonide ; will also give him IV Solu-Medrol  60 mg  every 12h and wean to 40 mg q12h given his improvement  - Does not wear oxygen at home will need to continue to monitor respiratory status carefully and continue with flutter valve, incentive spirometry and guaifenesin  1200 mg twice daily -Repeat CXR this AM done and showed Similar patchy airspace disease in the right lung base with progressive clearing in the right upper lung zone. Small left pleural effusion with streaky atelectasis in the left lung base. -CT scan without contrast done yesterday and showed Airspace disease throughout the right lung with some improvement in the right upper lobe, unchanged in the right middle lobe and right lower lobe. Increasing airspace disease in the left lower lobe. Findings compatible with multifocal pneumonia. Small left pleural effusion and trace right pleural effusion, stable since prior study. Cardiomegaly, three-vessel coronary artery disease. Aortic Atherosclerosis -CTA PE Protocol as below ruled out PE -Obtained SLP evaluation and they are recommending regular diet with thin liquids.   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  -Checked BNP and went from 842.8 -> 1,747.3. Repeat Echocardiogram showed LVEF of 25-30% with severely decreased Fxn, LV Global Hypokinesis, and LVDP consistent w/ Grade 1 DD and RV was moderately reduced with Severely elevated Pulmonary Artery Systolic Pressure. -Checked CTA Chest PE protocol to assess Decreased RV Fxn to r/o PE and showed no evidence of significant pulmonary embolus but did show persistent diffuse right lung infiltrates with mild improvement since the prior study as well as  persistent left pleural effusion and bilateral basilar consolidation or atelectasis which was similar.  Patient continues to have cardiac enlargement and aortic  atherosclerosis. -Continue Diuresis w/ IV Furosemide  40 mg BID  -Patient had a nuclear stress test on 07/12/23 that was reassuring and negative for ischemia -Cardiology was consulted for further evaluation and Recc's and there are scheduling the patient for a R and L heart cath on Monday 01/17/24 after diuresis and starting Entresto  low-dose -Irbesartan  300 mg po daily changed to Entresto  24-26 mg/tab. 1 tab p.o. twice daily.  Continue Toprol  12.5 mg po daily; also continue with aspirin  81 mg p.o. daily and Rosuvastatin  20 mg p.o. daily -Troponin 51, likely due to demand ischemia -Strict I's and O's and daily weights;  Intake/Output Summary (Last 24 hours) at 01/16/2024 1325 Last data filed at 01/16/2024 1000 Gross per 24 hour  Intake --  Output 3350 ml  Net -3350 ml  -CXR this AM showed Slight increase in right perihilar and bibasilar airspace disease . -Will need Ambulatory home O2 screen and Repeat CXR prior to discharge  NSVT, PVCs and PACs: Continue w/ Metoprolol  Succinate 25 mg po Daily and correct electrolytes as needed. Patient had noted ectopy on telemetry with PVCs and NSVT.  Cardiology added low-dose Amiodarone  200 mg p.o. daily as well. CTM on Telemetry   Essential HTN: Held Amlodipine  for now as BP has been either normal or low; he was continued on Metoprolol  Succinate 25 mg p.o. daily; Irbesartan  300 mg daily is now being changed to a Sacubitril -Valsartan  24-26 mg/tab. 1 tab p.o. twice daily.  Continue monitor blood pressures per protocol.  Last blood pressure reading was 129/53.  Hyperlipidemia: C/w Rosuvastatin  20 mg po daily but may need to hold if LFTs continue to worsen  Elevated ALT: Mild and likely 2/2 to Hepatic Congestion from Volume Overload vs Steroid Demargination. ALT went from 35 -> 50 -> 60. CTM and Trend and if necessary will obtain RUQ U/S and Acute Hepatitis panel. Repeat CMP in the AM  BPH: C/w Tamsulosin  0.4 mg po Daily    Normocytic Anemia: Hgb/Hct Trend has  been fairly stable. Last Hgb/Hct was 11.5/37.1 Checked Anemia Panel noted an iron level of 31, UIBC of 308, TIBC of 339, saturation ration of 9%, ferritin level 113, folate level 14.3 and vitamin B12 1019. CTM for S/Sx of Bleeding; No overt bleeding noted. Repeat CBC in the AM  Hypoalbuminemia: Patient's Albumin Lvl is now 2.8. CTM and Trend and repeat CMP in the AM  Overweight: Complicates overall prognosis and care. Estimated body mass index is 28.84 kg/m as calculated from the following:   Height as of this encounter: 6' (1.829 m).   Weight as of this encounter: 96.5 kg. Weight Loss and Dietary Counseling given   DVT prophylaxis: Place TED hose Start: 01/10/24 1026 enoxaparin  (LOVENOX ) injection 40 mg Start: 01/08/24 1000    Code Status: Full Code Family Communication: No family present @ bedside  Disposition Plan:  Level of care: Progressive Status is: Inpatient Remains inpatient appropriate because: Needs further clinical improvement and clearance by cardiology as he is going Cath   Consultants:  Cardiology  Procedures:  As delineated as above   Antimicrobials:  Anti-infectives (From admission, onward)    Start     Dose/Rate Route Frequency Ordered Stop   01/08/24 0115  ceFEPIme  (MAXIPIME ) 2 g in sodium chloride  0.9 % 100 mL IVPB  Status:  Discontinued        2 g 200 mL/hr over  30 Minutes Intravenous Every 8 hours 01/08/24 0114 01/08/24 1425   01/08/24 0115  linezolid  (ZYVOX ) IVPB 600 mg  Status:  Discontinued        600 mg 300 mL/hr over 60 Minutes Intravenous Every 12 hours 01/08/24 0114 01/08/24 1425       Subjective: Seen and examined at bedside and states that he is feeling little bit better today.  Was weaned off of oxygen and had no nausea or vomiting.  Denied any lightheadedness or dizziness.  Understands that his cath is tomorrow afternoon.  No other concerns or complaints at this time.  Objective: Vitals:   01/16/24 0543 01/16/24 0823 01/16/24 0848 01/16/24  1006  BP: (!) 116/101 139/74  (!) 129/53  Pulse: (!) 42 (!) 105  97  Resp: 20     Temp: 97.8 F (36.6 C)     TempSrc: Oral     SpO2: 97% 95% 92%   Weight:      Height:        Intake/Output Summary (Last 24 hours) at 01/16/2024 1326 Last data filed at 01/16/2024 1000 Gross per 24 hour  Intake --  Output 3350 ml  Net -3350 ml   Filed Weights   01/13/24 0500 01/14/24 0500 01/15/24 0500  Weight: 96.8 kg 96.5 kg 96.5 kg   Examination: Physical Exam:  Constitutional: WN/WD overweight elderly chronically ill-appearing Caucasian male no acute distress Respiratory: Diminished to auscultation bilaterally with some coarse breath sounds and has some slight crackles and mild rhonchi on the right compared to left but no appreciable wheezing or rales. Unlabored breathing and he has a normal respiratory effort and patient is not tachypenic. No accessory muscle use.  Not wearing supplemental oxygen nasal cannula Cardiovascular: RRR, no murmurs / rubs / gallops. S1 and S2 auscultated.  Has some lower extremity edema Abdomen: Soft, non-tender, distended secondary to body habitus. Bowel sounds positive.  GU: Deferred. Musculoskeletal: No clubbing / cyanosis of digits/nails. No joint deformity upper and lower extremities.  Skin: No rashes, lesions, ulcers on a limited skin evaluation. No induration; Warm and dry.  Neurologic: CN 2-12 grossly intact with no focal deficits. Romberg sign and cerebellar reflexes not assessed.  Psychiatric: Normal judgment and insight. Alert and oriented x 3. Normal mood and appropriate affect.   Data Reviewed: I have personally reviewed following labs and imaging studies  CBC: Recent Labs  Lab 01/12/24 0517 01/13/24 0537 01/14/24 0526 01/15/24 0633 01/16/24 0525  WBC 7.3 4.3 8.3 9.3 9.4  NEUTROABS 6.0 3.8 7.5 8.5* 8.5*  HGB 10.9* 11.0* 11.1* 11.1* 11.5*  HCT 35.5* 37.6* 37.1* 36.9* 37.1*  MCV 94.7 95.4 93.2 92.9 91.8  PLT 267 257 312 361 349   Basic  Metabolic Panel: Recent Labs  Lab 01/12/24 0517 01/13/24 0537 01/14/24 0526 01/15/24 0633 01/16/24 0525  NA 132* 135 135 136 134*  K 3.7 4.4 3.7 3.7 3.7  CL 95* 95* 92* 88* 88*  CO2 28 30 31  37* 35*  GLUCOSE 145* 200* 204* 235* 243*  BUN 15 13 18 21 20   CREATININE 0.48* 0.56* 0.66 0.75 0.71  CALCIUM  8.2* 8.5* 8.8* 8.9 8.4*  MG 2.0 2.1 2.1 2.2 2.1  PHOS 3.6 3.9 4.4 4.7* 3.6   GFR: Estimated Creatinine Clearance: 94.7 mL/min (by C-G formula based on SCr of 0.71 mg/dL). Liver Function Tests: Recent Labs  Lab 01/12/24 0517 01/13/24 0537 01/14/24 0526 01/15/24 0633 01/16/24 0525  AST 38 26 26 37 35  ALT 41 31 35 50* 60*  ALKPHOS 55 55 59 62 51  BILITOT 0.6 0.8 0.6 0.5 0.6  PROT 5.7* 5.8* 6.3* 6.3* 5.6*  ALBUMIN 2.9* 2.8* 3.1* 3.3* 2.8*   No results for input(s): LIPASE, AMYLASE in the last 168 hours. No results for input(s): AMMONIA in the last 168 hours. Coagulation Profile: No results for input(s): INR, PROTIME in the last 168 hours. Cardiac Enzymes: No results for input(s): CKTOTAL, CKMB, CKMBINDEX, TROPONINI in the last 168 hours. BNP (last 3 results) Recent Labs    01/07/24 2301  PROBNP 15,245.0*   HbA1C: No results for input(s): HGBA1C in the last 72 hours. CBG: No results for input(s): GLUCAP in the last 168 hours. Lipid Profile: No results for input(s): CHOL, HDL, LDLCALC, TRIG, CHOLHDL, LDLDIRECT in the last 72 hours. Thyroid  Function Tests: No results for input(s): TSH, T4TOTAL, FREET4, T3FREE, THYROIDAB in the last 72 hours. Anemia Panel: Recent Labs    01/14/24 0526  VITAMINB12 1,019*  FOLATE 14.3  FERRITIN 113  TIBC 339  IRON 31*  RETICCTPCT 1.9   Sepsis Labs: No results for input(s): PROCALCITON, LATICACIDVEN in the last 168 hours.  Recent Results (from the past 240 hours)  Culture, blood (Routine X 2) w Reflex to ID Panel     Status: None   Collection Time: 01/08/24  1:10 AM   Specimen:  BLOOD LEFT FOREARM  Result Value Ref Range Status   Specimen Description   Final    BLOOD LEFT FOREARM Performed at [facility], [address] [place], [place] [address]    Special Requests   Final    BOTTLES DRAWN AEROBIC AND ANAEROBIC Blood Culture adequate volume Performed at [facility], [address], [place], [place] [address]    Culture   Final    NO GROWTH 5 DAYS Performed at [facility], [address] [place], [place] [address]    Report Status 01/13/2024 FINAL  Final  Culture, blood (Routine X 2) w Reflex to ID Panel     Status: None   Collection Time: 01/08/24  1:20 AM   Specimen: BLOOD LEFT HAND  Result Value Ref Range Status   Specimen Description   Final    BLOOD LEFT HAND Performed at [facility], [address], [place], [place] [address]    Special Requests   Final    BOTTLES DRAWN AEROBIC AND ANAEROBIC Blood Culture results may not be optimal due to an inadequate volume of blood received in culture bottles Performed at [facility], [address], [place], [place] [address]    Culture   Final    NO GROWTH 5 DAYS Performed at [facility], [address] [place], [place] [address]    Report Status 01/13/2024 FINAL  Final  Resp panel by RT-PCR (RSV, Flu A&B, Covid) Anterior Nasal Swab     Status: None   Collection Time: 01/08/24  1:29 AM   Specimen: Anterior Nasal Swab  Result Value Ref Range Status   SARS Coronavirus 2 by RT PCR NEGATIVE NEGATIVE Final    Comment: (NOTE) SARS-CoV-2 target nucleic acids are NOT DETECTED.  The SARS-CoV-2 RNA is generally detectable in upper respiratory specimens during the acute phase of infection. The lowest concentration of SARS-CoV-2 viral copies this assay can detect is 138 copies/mL. A negative result does not preclude SARS-Cov-2 infection and should not be used as the sole basis for treatment or other patient  management decisions. A negative result may occur with  improper  specimen collection/handling, submission of specimen other than nasopharyngeal swab, presence of viral mutation(s) within the areas targeted by this assay, and inadequate number of viral copies(<138 copies/mL). A negative result must be combined with clinical observations, patient history, and epidemiological information. The expected result is Negative.  Fact Sheet for Patients:  BloggerCourse.com  Fact Sheet for Healthcare Providers:  SeriousBroker.it  This test is no t yet approved or cleared by the [place]  FDA and  has been authorized for detection and/or diagnosis of SARS-CoV-2 by FDA under an Emergency Use Authorization (EUA). This EUA will remain  in effect (meaning this test can be used) for the duration of the COVID-19 declaration under Section 564(b)(1) of the Act, 21 U.S.C.section 360bbb-3(b)(1), unless the authorization is terminated  or revoked sooner.       Influenza A by PCR NEGATIVE NEGATIVE Final   Influenza B by PCR NEGATIVE NEGATIVE Final    Comment: (NOTE) The Xpert Xpress SARS-CoV-2/FLU/RSV plus assay is intended as an aid in the diagnosis of influenza from Nasopharyngeal swab specimens and should not be used as a sole basis for treatment. Nasal washings and aspirates are unacceptable for Xpert Xpress SARS-CoV-2/FLU/RSV testing.  Fact Sheet for Patients: BloggerCourse.com  Fact Sheet for Healthcare Providers: SeriousBroker.it  This test is not yet approved or cleared by the [place]  FDA and has been authorized for detection and/or diagnosis of SARS-CoV-2 by FDA under an Emergency Use Authorization (EUA). This EUA will remain in effect (meaning this test can be used) for the duration of the COVID-19 declaration under Section 564(b)(1) of the Act, 21 U.S.C. section 360bbb-3(b)(1),  unless the authorization is terminated or revoked.     Resp Syncytial Virus by PCR NEGATIVE NEGATIVE Final    Comment: (NOTE) Fact Sheet for Patients: BloggerCourse.com  Fact Sheet for Healthcare Providers: SeriousBroker.it  This test is not yet approved or cleared by the [place]  FDA and has been authorized for detection and/or diagnosis of SARS-CoV-2 by FDA under an Emergency Use Authorization (EUA). This EUA will remain in effect (meaning this test can be used) for the duration of the COVID-19 declaration under Section 564(b)(1) of the Act, 21 U.S.C. section 360bbb-3(b)(1), unless the authorization is terminated or revoked.  Performed at [facility], [address], [place], [place] [address]   Respiratory (~20 pathogens) panel by PCR     Status: Abnormal   Collection Time: 01/08/24  9:54 AM   Specimen: Nasopharyngeal Swab; Respiratory  Result Value Ref Range Status   Adenovirus NOT DETECTED NOT DETECTED Final   Coronavirus 229E NOT DETECTED NOT DETECTED Final    Comment: (NOTE) The Coronavirus on the Respiratory Panel, DOES NOT test for the novel  Coronavirus (2019 nCoV)    Coronavirus HKU1 NOT DETECTED NOT DETECTED Final   Coronavirus NL63 NOT DETECTED NOT DETECTED Final   Coronavirus OC43 NOT DETECTED NOT DETECTED Final   Metapneumovirus DETECTED (A) NOT DETECTED Final   Rhinovirus / Enterovirus NOT DETECTED NOT DETECTED Final   Influenza A NOT DETECTED NOT DETECTED Final   Influenza B NOT DETECTED NOT DETECTED Final   Parainfluenza Virus 1 NOT DETECTED NOT DETECTED Final   Parainfluenza Virus 2 NOT DETECTED NOT DETECTED Final   Parainfluenza Virus 3 NOT DETECTED NOT DETECTED Final   Parainfluenza Virus 4 NOT DETECTED NOT DETECTED Final   Respiratory Syncytial Virus NOT DETECTED NOT DETECTED Final   Bordetella pertussis NOT DETECTED NOT DETECTED Final   Bordetella Parapertussis NOT DETECTED  NOT DETECTED Final  Chlamydophila pneumoniae NOT DETECTED NOT DETECTED Final   Mycoplasma pneumoniae NOT DETECTED NOT DETECTED Final    Comment: Performed at [facility], [address] [place], [place] [address]    Radiology Studies: DG CHEST PORT 1 VIEW Result Date: 01/16/2024 CLINICAL DATA:  141880 SOB (shortness of breath) 141880 EXAM: PORTABLE CHEST - 1 VIEW COMPARISON:  01/15/2024 FINDINGS: Right perihilar and bibasilar airspace opacities slightly increased from previous. Blunting of lateral costophrenic angles suggesting effusions. Right glenohumeral and left AC DJD. Heart size and mediastinal contours are within normal limits. IMPRESSION: Slight increase in right perihilar and bibasilar airspace disease. Electronically Signed   By: JONETTA Faes M.D.   On: 01/16/2024 11:03   DG CHEST PORT 1 VIEW Result Date: 01/15/2024 EXAM: 1 VIEW XRAY OF THE CHEST 01/15/2024 05:50:00 AM COMPARISON: None available. CLINICAL HISTORY: 141880 SOB (shortness of breath) 141880. Fever sob FINDINGS: LUNGS AND PLEURA: Stable linear opacities in the right mid lung and in both lung bases. Small pleural effusions. HEART AND MEDIASTINUM: No acute abnormality of the cardiac and mediastinal silhouettes. Aortic atherosclerosis (ICD10-i70.0). BONES AND SOFT TISSUES: No acute osseous abnormality. IMPRESSION: 1. Stable linear opacities in the right mid lung and in both lung bases. 2. Small pleural effusions. Electronically signed by: Katheleen Faes MD 01/15/2024 09:47 AM EDT RP Workstation: [id number]   CT Angio Chest Pulmonary Embolism (PE) W or WO Contrast Result Date: 01/14/2024 CLINICAL DATA:  Chronic dyspnea. Chest wall or pleural disease suspected. Shortness of breath and chest pain. EXAM: CT ANGIOGRAPHY CHEST WITH CONTRAST TECHNIQUE: Multidetector CT imaging of the chest was performed using the standard protocol during bolus administration of intravenous contrast. Multiplanar CT image reconstructions and MIPs were  obtained to evaluate the vascular anatomy. RADIATION DOSE REDUCTION: This exam was performed according to the departmental dose-optimization program which includes automated exposure control, adjustment of the mA and/or kV according to patient size and/or use of iterative reconstruction technique. CONTRAST:  75mL OMNIPAQUE  IOHEXOL  350 MG/ML SOLN COMPARISON:  Chest radiograph 01/14/2024.  CT chest 01/12/2024 FINDINGS: Cardiovascular: Cardiac enlargement. No pericardial effusions. Normal caliber thoracic aorta. Calcification of the aorta and coronary arteries. Central pulmonary arteries are well opacified. No evidence of significant pulmonary embolus. Mediastinum/Nodes: Thyroid  gland is unremarkable. Esophagus is decompressed. Scattered mediastinal lymph nodes are not pathologically enlarged, likely reactive. Lungs/Pleura: Small left pleural effusion. This is similar to prior study. Atelectasis or consolidation seen in both lung bases also similar. Patchy airspace infiltration throughout the right lung remains present but is mildly improved since the prior CT. Motion artifact limits examination. No pneumothorax. Upper Abdomen: No acute abnormalities. Musculoskeletal: Degenerative changes in the spine and shoulders. No acute bony abnormalities. Review of the MIP images confirms the above findings. IMPRESSION: 1. Persistent diffuse right lung infiltrates with mild improvement since prior study. 2. Persistent left pleural effusion and bilateral basilar consolidation or atelectasis, similar to prior study. 3. Cardiac enlargement. 4. Aortic atherosclerosis. 5. No evidence of significant pulmonary embolus. Electronically Signed   By: Elsie Gravely M.D.   On: 01/14/2024 18:12   Scheduled Meds:  amiodarone   200 mg Oral Daily   arformoterol   15 mcg Nebulization BID   aspirin  EC  81 mg Oral Daily   benzonatate   200 mg Oral TID   budesonide  (PULMICORT ) nebulizer solution  0.25 mg Nebulization BID   enoxaparin   (LOVENOX ) injection  40 mg Subcutaneous Q24H   furosemide   40 mg Intravenous BID   guaiFENesin   1,200 mg Oral BID  ipratropium  0.5 mg Nebulization TID   levalbuterol   0.63 mg Nebulization TID   methylPREDNISolone  (SOLU-MEDROL ) injection  40 mg Intravenous Q12H   metoprolol  succinate  25 mg Oral Daily   rosuvastatin   20 mg Oral Daily   sacubitril -valsartan   1 tablet Oral BID   tamsulosin   0.4 mg Oral QPC supper   Continuous Infusions:   LOS: 8 days   Alejandro Marker, DO [facility] Available via Epic secure chat 7am-7pm After these hours, please refer to coverage provider listed on [email or web address] 01/16/2024, 1:26 PM

## 2024-01-16 NOTE — Progress Notes (Signed)
 Cardiologist:  Anner  Subjective:   No angina Breathing better getting Rx now  Objective:  Vitals:   01/15/24 2116 01/16/24 0543 01/16/24 0823 01/16/24 0848  BP: 115/84 (!) 116/101 139/74   Pulse: (!) 101 (!) 42 (!) 105   Resp: 20 20    Temp: 98.2 F (36.8 C) 97.8 F (36.6 C)    TempSrc: Oral Oral    SpO2: 95% 97% 95% 92%  Weight:      Height:        Intake/Output from previous day:  Intake/Output Summary (Last 24 hours) at 01/16/2024 0906 Last data filed at 01/16/2024 0600 Gross per 24 hour  Intake 240 ml  Output 3250 ml  Net -3010 ml    Physical Exam:  Elderly white male Abnormal lung exam  egophony/crackles right base only Abdomen benign Post right TKR Plus 2 bilateral edema No murmur  Lab Results: Basic Metabolic Panel: Recent Labs    01/15/24 0633 01/16/24 0525  NA 136 134*  K 3.7 3.7  CL 88* 88*  CO2 37* 35*  GLUCOSE 235* 243*  BUN 21 20  CREATININE 0.75 0.71  CALCIUM  8.9 8.4*  MG 2.2 2.1  PHOS 4.7* 3.6   Liver Function Tests: Recent Labs    01/15/24 0633 01/16/24 0525  AST 37 35  ALT 50* 60*  ALKPHOS 62 51  BILITOT 0.5 0.6  PROT 6.3* 5.6*  ALBUMIN 3.3* 2.8*   CBC: Recent Labs    01/15/24 0633 01/16/24 0525  WBC 9.3 9.4  NEUTROABS 8.5* 8.5*  HGB 11.1* 11.5*  HCT 36.9* 37.1*  MCV 92.9 91.8  PLT 361 349    Anemia Panel: Recent Labs    01/14/24 0526  VITAMINB12 1,019*  FOLATE 14.3  FERRITIN 113  TIBC 339  IRON 31*  RETICCTPCT 1.9    Imaging: DG CHEST PORT 1 VIEW Result Date: 01/15/2024 EXAM: 1 VIEW XRAY OF THE CHEST 01/15/2024 05:50:00 AM COMPARISON: None available. CLINICAL HISTORY: 141880 SOB (shortness of breath) 141880. Fever sob FINDINGS: LUNGS AND PLEURA: Stable linear opacities in the right mid lung and in both lung bases. Small pleural effusions. HEART AND MEDIASTINUM: No acute abnormality of the cardiac and mediastinal silhouettes. Aortic atherosclerosis (ICD10-i70.0). BONES AND SOFT TISSUES: No acute  osseous abnormality. IMPRESSION: 1. Stable linear opacities in the right mid lung and in both lung bases. 2. Small pleural effusions. Electronically signed by: Katheleen Faes MD 01/15/2024 09:47 AM EDT RP Workstation: [id number]   CT Angio Chest Pulmonary Embolism (PE) W or WO Contrast Result Date: 01/14/2024 CLINICAL DATA:  Chronic dyspnea. Chest wall or pleural disease suspected. Shortness of breath and chest pain. EXAM: CT ANGIOGRAPHY CHEST WITH CONTRAST TECHNIQUE: Multidetector CT imaging of the chest was performed using the standard protocol during bolus administration of intravenous contrast. Multiplanar CT image reconstructions and MIPs were obtained to evaluate the vascular anatomy. RADIATION DOSE REDUCTION: This exam was performed according to the departmental dose-optimization program which includes automated exposure control, adjustment of the mA and/or kV according to patient size and/or use of iterative reconstruction technique. CONTRAST:  75mL OMNIPAQUE  IOHEXOL  350 MG/ML SOLN COMPARISON:  Chest radiograph 01/14/2024.  CT chest 01/12/2024 FINDINGS: Cardiovascular: Cardiac enlargement. No pericardial effusions. Normal caliber thoracic aorta. Calcification of the aorta and coronary arteries. Central pulmonary arteries are well opacified. No evidence of significant pulmonary embolus. Mediastinum/Nodes: Thyroid  gland is unremarkable. Esophagus is decompressed. Scattered mediastinal lymph nodes are not pathologically enlarged, likely reactive. Lungs/Pleura: Small left pleural effusion. This is similar  to prior study. Atelectasis or consolidation seen in both lung bases also similar. Patchy airspace infiltration throughout the right lung remains present but is mildly improved since the prior CT. Motion artifact limits examination. No pneumothorax. Upper Abdomen: No acute abnormalities. Musculoskeletal: Degenerative changes in the spine and shoulders. No acute bony abnormalities. Review of the MIP images  confirms the above findings. IMPRESSION: 1. Persistent diffuse right lung infiltrates with mild improvement since prior study. 2. Persistent left pleural effusion and bilateral basilar consolidation or atelectasis, similar to prior study. 3. Cardiac enlargement. 4. Aortic atherosclerosis. 5. No evidence of significant pulmonary embolus. Electronically Signed   By: Elsie Gravely M.D.   On: 01/14/2024 18:12    Cardiac Studies:  ECG: ST HR 116 PAC   Telemetry:  SR PAC/PVC  Echo: EF 25-30% moderate RV reduction mild MR mild AR  Medications:    amiodarone   200 mg Oral Daily   arformoterol   15 mcg Nebulization BID   aspirin  EC  81 mg Oral Daily   benzonatate   200 mg Oral TID   budesonide  (PULMICORT ) nebulizer solution  0.25 mg Nebulization BID   enoxaparin  (LOVENOX ) injection  40 mg Subcutaneous Q24H   furosemide   40 mg Intravenous BID   guaiFENesin   1,200 mg Oral BID   ipratropium  0.5 mg Nebulization TID   levalbuterol   0.63 mg Nebulization TID   methylPREDNISolone  (SOLU-MEDROL ) injection  60 mg Intravenous Q12H   metoprolol  succinate  12.5 mg Oral Daily   rosuvastatin   20 mg Oral Daily   sacubitril -valsartan   1 tablet Oral BID   tamsulosin   0.4 mg Oral QPC supper      Assessment/Plan:  Acute on chronic systolic heart failure Pulmonary hypertension, reduced RV function - historically with LVEF 40-45% range, most recent echo 06/2023 - presented with hypoxic respiratory failure, dyspnea, and LE edema - echo now with further reduction in LVEF to 25-30% along with incomplete description of chest pain - on 12.5 mg toprol  - will monitor output with diuresis - change ARB to entresto   - albumin 2.8-3.1 - on 40 mg IV lasix  BID - continue this - given hx of CAD, will schedule for right and left heart cath tomorrow   Shared Decision Making/Informed Consent The risks [stroke (1 in 1000), death (1 in 1000), kidney failure [usually temporary] (1 in 500), bleeding (1 in 200), allergic  reaction [possibly serious] (1 in 200)], benefits (diagnostic support and management of coronary artery disease) and alternatives of a cardiac catheterization were discussed in detail with Chase Combs and she is willing to proceed.      NSVT, PVCs, PACs - ectopy on telemetry with PVCs and NSVT - Mg 2.2 this morning, K 3.7 - receiving 12.5 mg toprol   will increase to 25 mg daily - add low dose amiodarone  PO     Metapneumovirus Acute hypoxic respiratory failure - Receiving breathing treatments -- remains on O2 2 L - CXR still with infiltrate right lung and abnormal exam  - no active wheezing RLL exam still abnormal      CAD - prior BMS stenting to RCA with restenosis requiring thrombectomy, last intervention 2010 - recent 07/12/23  nuclear stress test reassuring, negative for ischemia - given further reduction in LVEF, chest pain, and mild troponin elevation, will plan for R/L HC on Monday - continue statin - see above regarding cath tomorrow     Hypertension - managed in the context of CHF see meds above  Chase Combs 01/16/2024, 9:06 AM

## 2024-01-16 NOTE — H&P (View-Only) (Signed)
 Cardiologist:  Chase Combs  Subjective:   No angina Breathing better getting Rx now  Objective:  Vitals:   01/15/24 2116 01/16/24 0543 01/16/24 0823 01/16/24 0848  BP: 115/84 (!) 116/101 139/74   Pulse: (!) 101 (!) 42 (!) 105   Resp: 20 20    Temp: 98.2 F (36.8 C) 97.8 F (36.6 C)    TempSrc: Oral Oral    SpO2: 95% 97% 95% 92%  Weight:      Height:        Intake/Output from previous day:  Intake/Output Summary (Last 24 hours) at 01/16/2024 0906 Last data filed at 01/16/2024 0600 Gross per 24 hour  Intake 240 ml  Output 3250 ml  Net -3010 ml    Physical Exam:  Elderly white male Abnormal lung exam  egophony/crackles right base only Abdomen benign Post right TKR Plus 2 bilateral edema No murmur  Lab Results: Basic Metabolic Panel: Recent Labs    01/15/24 0633 01/16/24 0525  NA 136 134*  K 3.7 3.7  CL 88* 88*  CO2 37* 35*  GLUCOSE 235* 243*  BUN 21 20  CREATININE 0.75 0.71  CALCIUM  8.9 8.4*  MG 2.2 2.1  PHOS 4.7* 3.6   Liver Function Tests: Recent Labs    01/15/24 0633 01/16/24 0525  AST 37 35  ALT 50* 60*  ALKPHOS 62 51  BILITOT 0.5 0.6  PROT 6.3* 5.6*  ALBUMIN 3.3* 2.8*   CBC: Recent Labs    01/15/24 0633 01/16/24 0525  WBC 9.3 9.4  NEUTROABS 8.5* 8.5*  HGB 11.1* 11.5*  HCT 36.9* 37.1*  MCV 92.9 91.8  PLT 361 349    Anemia Panel: Recent Labs    01/14/24 0526  VITAMINB12 1,019*  FOLATE 14.3  FERRITIN 113  TIBC 339  IRON 31*  RETICCTPCT 1.9    Imaging: DG CHEST PORT 1 VIEW Result Date: 01/15/2024 EXAM: 1 VIEW XRAY OF THE CHEST 01/15/2024 05:50:00 AM COMPARISON: None available. CLINICAL HISTORY: 141880 SOB (shortness of breath) 141880. Fever sob FINDINGS: LUNGS AND PLEURA: Stable linear opacities in the right mid lung and in both lung bases. Small pleural effusions. HEART AND MEDIASTINUM: No acute abnormality of the cardiac and mediastinal silhouettes. Aortic atherosclerosis (ICD10-i70.0). BONES AND SOFT TISSUES: No acute  osseous abnormality. IMPRESSION: 1. Stable linear opacities in the right mid lung and in both lung bases. 2. Small pleural effusions. Electronically signed by: Chase Faes MD 01/15/2024 09:47 AM EDT RP Workstation: [id number]   CT Angio Chest Pulmonary Embolism (PE) W or WO Contrast Result Date: 01/14/2024 CLINICAL DATA:  Chronic dyspnea. Chest wall or pleural disease suspected. Shortness of breath and chest pain. EXAM: CT ANGIOGRAPHY CHEST WITH CONTRAST TECHNIQUE: Multidetector CT imaging of the chest was performed using the standard protocol during bolus administration of intravenous contrast. Multiplanar CT image reconstructions and MIPs were obtained to evaluate the vascular anatomy. RADIATION DOSE REDUCTION: This exam was performed according to the departmental dose-optimization program which includes automated exposure control, adjustment of the mA and/or kV according to patient size and/or use of iterative reconstruction technique. CONTRAST:  75mL OMNIPAQUE  IOHEXOL  350 MG/ML SOLN COMPARISON:  Chest radiograph 01/14/2024.  CT chest 01/12/2024 FINDINGS: Cardiovascular: Cardiac enlargement. No pericardial effusions. Normal caliber thoracic aorta. Calcification of the aorta and coronary arteries. Central pulmonary arteries are well opacified. No evidence of significant pulmonary embolus. Mediastinum/Nodes: Thyroid  gland is unremarkable. Esophagus is decompressed. Scattered mediastinal lymph nodes are not pathologically enlarged, likely reactive. Lungs/Pleura: Small left pleural effusion. This is similar  to prior study. Atelectasis or consolidation seen in both lung bases also similar. Patchy airspace infiltration throughout the right lung remains present but is mildly improved since the prior CT. Motion artifact limits examination. No pneumothorax. Upper Abdomen: No acute abnormalities. Musculoskeletal: Degenerative changes in the spine and shoulders. No acute bony abnormalities. Review of the MIP images  confirms the above findings. IMPRESSION: 1. Persistent diffuse right lung infiltrates with mild improvement since prior study. 2. Persistent left pleural effusion and bilateral basilar consolidation or atelectasis, similar to prior study. 3. Cardiac enlargement. 4. Aortic atherosclerosis. 5. No evidence of significant pulmonary embolus. Electronically Signed   By: Chase Combs M.D.   On: 01/14/2024 18:12    Cardiac Studies:  ECG: ST HR 116 PAC   Telemetry:  SR PAC/PVC  Echo: EF 25-30% moderate RV reduction mild MR mild AR  Medications:    amiodarone   200 mg Oral Daily   arformoterol   15 mcg Nebulization BID   aspirin  EC  81 mg Oral Daily   benzonatate   200 mg Oral TID   budesonide  (PULMICORT ) nebulizer solution  0.25 mg Nebulization BID   enoxaparin  (LOVENOX ) injection  40 mg Subcutaneous Q24H   furosemide   40 mg Intravenous BID   guaiFENesin   1,200 mg Oral BID   ipratropium  0.5 mg Nebulization TID   levalbuterol   0.63 mg Nebulization TID   methylPREDNISolone  (SOLU-MEDROL ) injection  60 mg Intravenous Q12H   metoprolol  succinate  12.5 mg Oral Daily   rosuvastatin   20 mg Oral Daily   sacubitril -valsartan   1 tablet Oral BID   tamsulosin   0.4 mg Oral QPC supper      Assessment/Plan:  Acute on chronic systolic heart failure Pulmonary hypertension, reduced RV function - historically with LVEF 40-45% range, most recent echo 06/2023 - presented with hypoxic respiratory failure, dyspnea, and LE edema - echo now with further reduction in LVEF to 25-30% along with incomplete description of chest pain - on 12.5 mg toprol  - will monitor output with diuresis - change ARB to entresto   - albumin 2.8-3.1 - on 40 mg IV lasix  BID - continue this - given hx of CAD, will schedule for right and left heart cath tomorrow   Shared Decision Making/Informed Consent The risks [stroke (1 in 1000), death (1 in 1000), kidney failure [usually temporary] (1 in 500), bleeding (1 in 200), allergic  reaction [possibly serious] (1 in 200)], benefits (diagnostic support and management of coronary artery disease) and alternatives of a cardiac catheterization were discussed in detail with Chase Combs and she is willing to proceed.      NSVT, PVCs, PACs - ectopy on telemetry with PVCs and NSVT - Mg 2.2 this morning, K 3.7 - receiving 12.5 mg toprol   will increase to 25 mg daily - add low dose amiodarone  PO     Metapneumovirus Acute hypoxic respiratory failure - Receiving breathing treatments -- remains on O2 2 L - CXR still with infiltrate right lung and abnormal exam  - no active wheezing RLL exam still abnormal      CAD - prior BMS stenting to RCA with restenosis requiring thrombectomy, last intervention 2010 - recent 07/12/23  nuclear stress test reassuring, negative for ischemia - given further reduction in LVEF, chest pain, and mild troponin elevation, will plan for R/L HC on Monday - continue statin - see above regarding cath tomorrow     Hypertension - managed in the context of CHF see meds above  Chase Combs 01/16/2024, 9:06 AM

## 2024-01-17 ENCOUNTER — Encounter (HOSPITAL_COMMUNITY)
Admission: EM | Disposition: A | Discharge status: Home / Self Care | Payer: Self-pay | Source: Home / Self Care | Attending: Internal Medicine

## 2024-01-17 ENCOUNTER — Inpatient Hospital Stay (HOSPITAL_COMMUNITY)

## 2024-01-17 DIAGNOSIS — I5022 Chronic systolic (congestive) heart failure: Secondary | ICD-10-CM

## 2024-01-17 DIAGNOSIS — I5043 Acute on chronic combined systolic (congestive) and diastolic (congestive) heart failure: Secondary | ICD-10-CM | POA: Diagnosis not present

## 2024-01-17 DIAGNOSIS — I4729 Other ventricular tachycardia: Secondary | ICD-10-CM | POA: Diagnosis not present

## 2024-01-17 DIAGNOSIS — I2510 Atherosclerotic heart disease of native coronary artery without angina pectoris: Secondary | ICD-10-CM | POA: Diagnosis not present

## 2024-01-17 DIAGNOSIS — I25118 Atherosclerotic heart disease of native coronary artery with other forms of angina pectoris: Secondary | ICD-10-CM | POA: Diagnosis not present

## 2024-01-17 DIAGNOSIS — I255 Ischemic cardiomyopathy: Secondary | ICD-10-CM | POA: Diagnosis not present

## 2024-01-17 DIAGNOSIS — I5023 Acute on chronic systolic (congestive) heart failure: Secondary | ICD-10-CM | POA: Diagnosis not present

## 2024-01-17 HISTORY — PX: CORONARY ULTRASOUND/IVUS: CATH118244

## 2024-01-17 HISTORY — PX: CORONARY PRESSURE/FFR STUDY: CATH118243

## 2024-01-17 HISTORY — PX: RIGHT/LEFT HEART CATH AND CORONARY ANGIOGRAPHY: CATH118266

## 2024-01-17 HISTORY — PX: CORONARY STENT INTERVENTION: CATH118234

## 2024-01-17 LAB — CBC WITH DIFFERENTIAL/PLATELET
Abs Immature Granulocytes: 0.11 K/uL — ABNORMAL HIGH (ref 0.00–0.07)
Basophils Absolute: 0 K/uL (ref 0.0–0.1)
Basophils Relative: 0 %
Eosinophils Absolute: 0 K/uL (ref 0.0–0.5)
Eosinophils Relative: 0 %
HCT: 45.1 % (ref 39.0–52.0)
Hemoglobin: 13.3 g/dL (ref 13.0–17.0)
Immature Granulocytes: 1 %
Lymphocytes Relative: 6 %
Lymphs Abs: 0.7 K/uL (ref 0.7–4.0)
MCH: 28.9 pg (ref 26.0–34.0)
MCHC: 29.5 g/dL — ABNORMAL LOW (ref 30.0–36.0)
MCV: 97.8 fL (ref 80.0–100.0)
Monocytes Absolute: 0.5 K/uL (ref 0.1–1.0)
Monocytes Relative: 4 %
Neutro Abs: 10.6 K/uL — ABNORMAL HIGH (ref 1.7–7.7)
Neutrophils Relative %: 89 %
Platelets: 420 K/uL — ABNORMAL HIGH (ref 150–400)
RBC: 4.61 MIL/uL (ref 4.22–5.81)
RDW: 16.2 % — ABNORMAL HIGH (ref 11.5–15.5)
WBC: 11.8 K/uL — ABNORMAL HIGH (ref 4.0–10.5)
nRBC: 0 % (ref 0.0–0.2)

## 2024-01-17 LAB — COMPREHENSIVE METABOLIC PANEL WITH GFR
ALT: 75 U/L — ABNORMAL HIGH (ref 0–44)
AST: 37 U/L (ref 15–41)
Albumin: 3.2 g/dL — ABNORMAL LOW (ref 3.5–5.0)
Alkaline Phosphatase: 66 U/L (ref 38–126)
Anion gap: 13 (ref 5–15)
BUN: 23 mg/dL (ref 8–23)
CO2: 30 mmol/L (ref 22–32)
Calcium: 8.7 mg/dL — ABNORMAL LOW (ref 8.9–10.3)
Chloride: 89 mmol/L — ABNORMAL LOW (ref 98–111)
Creatinine, Ser: 0.59 mg/dL — ABNORMAL LOW (ref 0.61–1.24)
GFR, Estimated: >60 mL/min (ref >60)
Glucose, Bld: 243 mg/dL — ABNORMAL HIGH (ref 70–99)
Potassium: 3.6 mmol/L (ref 3.5–5.1)
Sodium: 132 mmol/L — ABNORMAL LOW (ref 135–145)
Total Bilirubin: 0.7 mg/dL (ref 0.0–1.2)
Total Protein: 6.2 g/dL — ABNORMAL LOW (ref 6.5–8.1)

## 2024-01-17 LAB — POCT ACTIVATED CLOTTING TIME
Activated Clotting Time: 205 s
Activated Clotting Time: 233 s
Activated Clotting Time: 262 s
Activated Clotting Time: 245 s
Activated Clotting Time: 285 s
Activated Clotting Time: 291 s

## 2024-01-17 LAB — POCT I-STAT EG7
Acid-Base Excess: 13 mmol/L — ABNORMAL HIGH (ref 0.0–2.0)
Bicarbonate: 38.5 mmol/L — ABNORMAL HIGH (ref 20.0–28.0)
Calcium, Ion: 1.08 mmol/L — ABNORMAL LOW (ref 1.15–1.40)
HCT: 41 % (ref 39.0–52.0)
Hemoglobin: 13.9 g/dL (ref 13.0–17.0)
O2 Saturation: 69 %
Potassium: 3 mmol/L — ABNORMAL LOW (ref 3.5–5.1)
Sodium: 133 mmol/L — ABNORMAL LOW (ref 135–145)
TCO2: 40 mmol/L — ABNORMAL HIGH (ref 22–32)
pCO2, Ven: 49.6 mmHg (ref 44–60)
pH, Ven: 7.498 — ABNORMAL HIGH (ref 7.25–7.43)
pO2, Ven: 34 mmHg (ref 32–45)

## 2024-01-17 LAB — POCT I-STAT 7, (LYTES, BLD GAS, ICA,H+H)
Acid-Base Excess: 15 mmol/L — ABNORMAL HIGH (ref 0.0–2.0)
Bicarbonate: 40 mmol/L — ABNORMAL HIGH (ref 20.0–28.0)
Calcium, Ion: 1.1 mmol/L — ABNORMAL LOW (ref 1.15–1.40)
HCT: 41 % (ref 39.0–52.0)
Hemoglobin: 13.9 g/dL (ref 13.0–17.0)
O2 Saturation: 93 %
Potassium: 3.1 mmol/L — ABNORMAL LOW (ref 3.5–5.1)
Sodium: 133 mmol/L — ABNORMAL LOW (ref 135–145)
TCO2: 41 mmol/L — ABNORMAL HIGH (ref 22–32)
pCO2 arterial: 46.9 mmHg (ref 32–48)
pH, Arterial: 7.539 — ABNORMAL HIGH (ref 7.35–7.45)
pO2, Arterial: 61 mmHg — ABNORMAL LOW (ref 83–108)

## 2024-01-17 LAB — MAGNESIUM: Magnesium: 2.2 mg/dL (ref 1.7–2.4)

## 2024-01-17 LAB — PHOSPHORUS: Phosphorus: 3.7 mg/dL (ref 2.5–4.6)

## 2024-01-17 MED ORDER — HEPARIN SODIUM (PORCINE) 1000 UNIT/ML IJ SOLN
INTRAMUSCULAR | Status: AC
  Filled 2024-01-17: qty 10

## 2024-01-17 MED ORDER — HEPARIN (PORCINE) IN NACL 1000-0.9 UT/500ML-% IV SOLN
INTRAVENOUS | Status: DC | PRN
  Administered 2024-01-17 (×3): 500 mL

## 2024-01-17 MED ORDER — METHYLPREDNISOLONE SODIUM SUCC 125 MG IJ SOLR
40.0000 mg | Freq: Every day | INTRAMUSCULAR | Status: DC
  Administered 2024-01-18: 40 mg via INTRAVENOUS
  Filled 2024-01-17: qty 2

## 2024-01-17 MED ORDER — ACETAMINOPHEN 325 MG PO TABS
ORAL_TABLET | ORAL | Status: AC
  Filled 2024-01-17: qty 2

## 2024-01-17 MED ORDER — HYDRALAZINE HCL 20 MG/ML IJ SOLN: 10.0000 mg | INTRAMUSCULAR | Status: AC | PRN

## 2024-01-17 MED ORDER — MIDAZOLAM HCL 2 MG/2ML IJ SOLN
INTRAMUSCULAR | Status: AC
  Filled 2024-01-17: qty 2

## 2024-01-17 MED ORDER — ONDANSETRON HCL 4 MG/2ML IJ SOLN
4.0000 mg | Freq: Four times a day (QID) | INTRAMUSCULAR | Status: DC | PRN
  Administered 2024-01-17 – 2024-01-18 (×2): 4 mg via INTRAVENOUS
  Filled 2024-01-17: qty 2

## 2024-01-17 MED ORDER — GUAIFENESIN-DM 100-10 MG/5ML PO SYRP
5.0000 mL | ORAL_SOLUTION | ORAL | Status: DC | PRN
  Administered 2024-01-18: 5 mL via ORAL
  Filled 2024-01-17: qty 5

## 2024-01-17 MED ORDER — ASPIRIN 81 MG PO CHEW
81.0000 mg | CHEWABLE_TABLET | ORAL | Status: DC
  Filled 2024-01-17: qty 1

## 2024-01-17 MED ORDER — NITROGLYCERIN 1 MG/10 ML FOR IR/CATH LAB
INTRA_ARTERIAL | Status: DC | PRN
  Administered 2024-01-17: 100 ug via INTRACORONARY
  Administered 2024-01-17: 200 ug via INTRACORONARY

## 2024-01-17 MED ORDER — TICAGRELOR 90 MG PO TABS
ORAL_TABLET | ORAL | Status: DC | PRN
  Administered 2024-01-17: 180 mg via ORAL

## 2024-01-17 MED ORDER — VERAPAMIL HCL 2.5 MG/ML IV SOLN
INTRAVENOUS | Status: DC | PRN
  Administered 2024-01-17: 10 mL via INTRA_ARTERIAL

## 2024-01-17 MED ORDER — VERAPAMIL HCL 2.5 MG/ML IV SOLN
INTRAVENOUS | Status: AC
  Filled 2024-01-17: qty 2

## 2024-01-17 MED ORDER — NITROGLYCERIN 1 MG/10 ML FOR IR/CATH LAB
INTRA_ARTERIAL | Status: AC
  Filled 2024-01-17: qty 10

## 2024-01-17 MED ORDER — LABETALOL HCL 5 MG/ML IV SOLN: 10.0000 mg | INTRAVENOUS | Status: AC | PRN

## 2024-01-17 MED ORDER — FREE WATER
250.0000 mL | Freq: Once | Status: AC
  Administered 2024-01-17: 250 mL via ORAL

## 2024-01-17 MED ORDER — FUROSEMIDE 10 MG/ML IJ SOLN
40.0000 mg | Freq: Every day | INTRAMUSCULAR | Status: DC
  Administered 2024-01-18: 40 mg via INTRAVENOUS
  Filled 2024-01-17: qty 4

## 2024-01-17 MED ORDER — ASPIRIN 81 MG PO CHEW
81.0000 mg | CHEWABLE_TABLET | ORAL | Status: AC
  Administered 2024-01-17: 81 mg via ORAL
  Filled 2024-01-17: qty 1

## 2024-01-17 MED ORDER — ONDANSETRON HCL 4 MG/2ML IJ SOLN
INTRAMUSCULAR | Status: AC
  Filled 2024-01-17: qty 2

## 2024-01-17 MED ORDER — FENTANYL CITRATE (PF) 100 MCG/2ML IJ SOLN
INTRAMUSCULAR | Status: DC | PRN
  Administered 2024-01-17: 12.5 ug via INTRAVENOUS

## 2024-01-17 MED ORDER — SODIUM CHLORIDE 0.9% FLUSH
3.0000 mL | Freq: Two times a day (BID) | INTRAVENOUS | Status: DC
  Administered 2024-01-17 – 2024-01-19 (×4): 3 mL via INTRAVENOUS

## 2024-01-17 MED ORDER — SODIUM CHLORIDE 0.9 % IV SOLN: 250.0000 mL | INTRAVENOUS | Status: AC | PRN

## 2024-01-17 MED ORDER — ACETAMINOPHEN 500 MG PO TABS
ORAL_TABLET | ORAL | Status: AC
  Filled 2024-01-17: qty 1

## 2024-01-17 MED ORDER — HEPARIN SODIUM (PORCINE) 1000 UNIT/ML IJ SOLN
INTRAMUSCULAR | Status: DC | PRN
  Administered 2024-01-17: 5000 [IU] via INTRAVENOUS
  Administered 2024-01-17: 4000 [IU] via INTRAVENOUS
  Administered 2024-01-17: 2000 [IU] via INTRAVENOUS
  Administered 2024-01-17 (×2): 3000 [IU] via INTRAVENOUS
  Administered 2024-01-17 (×2): 5000 [IU] via INTRAVENOUS

## 2024-01-17 MED ORDER — GUAIFENESIN 100 MG/5ML PO LIQD
15.0000 mL | Freq: Once | ORAL | Status: DC
  Filled 2024-01-17: qty 20

## 2024-01-17 MED ORDER — TICAGRELOR 90 MG PO TABS
ORAL_TABLET | ORAL | Status: AC
  Filled 2024-01-17: qty 2

## 2024-01-17 MED ORDER — LIDOCAINE HCL (PF) 1 % IJ SOLN
INTRAMUSCULAR | Status: AC
  Filled 2024-01-17: qty 30

## 2024-01-17 MED ORDER — MIDAZOLAM HCL 2 MG/2ML IJ SOLN
INTRAMUSCULAR | Status: DC | PRN
Start: 2024-01-17 — End: 2024-01-17
  Administered 2024-01-17: .5 mg via INTRAVENOUS

## 2024-01-17 MED ORDER — FENTANYL CITRATE (PF) 100 MCG/2ML IJ SOLN
INTRAMUSCULAR | Status: AC
  Filled 2024-01-17: qty 2

## 2024-01-17 MED ORDER — LIDOCAINE HCL (PF) 1 % IJ SOLN
INTRAMUSCULAR | Status: DC | PRN
Start: 2024-01-17 — End: 2024-01-17
  Administered 2024-01-17: 5 mL

## 2024-01-17 MED ORDER — TICAGRELOR 90 MG PO TABS
90.0000 mg | ORAL_TABLET | Freq: Two times a day (BID) | ORAL | Status: DC
  Administered 2024-01-17 – 2024-01-19 (×4): 90 mg via ORAL
  Filled 2024-01-17 (×4): qty 1

## 2024-01-17 MED ORDER — IOHEXOL 350 MG/ML SOLN
INTRAVENOUS | Status: DC | PRN
  Administered 2024-01-17: 200 mL

## 2024-01-17 MED ORDER — SODIUM CHLORIDE 0.9% FLUSH: 3.0000 mL | INTRAVENOUS | Status: DC | PRN

## 2024-01-17 MED ORDER — SACUBITRIL-VALSARTAN 49-51 MG PO TABS
1.0000 | ORAL_TABLET | Freq: Two times a day (BID) | ORAL | Status: DC
  Administered 2024-01-17 – 2024-01-19 (×4): 1 via ORAL
  Filled 2024-01-17 (×4): qty 1

## 2024-01-17 MED ORDER — ENOXAPARIN SODIUM 40 MG/0.4ML IJ SOSY
40.0000 mg | PREFILLED_SYRINGE | INTRAMUSCULAR | Status: DC
  Administered 2024-01-18 – 2024-01-19 (×2): 40 mg via SUBCUTANEOUS
  Filled 2024-01-17 (×2): qty 0.4

## 2024-01-17 NOTE — Interval H&P Note (Signed)
 History and Physical Interval Note:  01/17/2024 10:52 AM  Chase Combs  has presented today for surgery, with the diagnosis of acute on chronic HFrEF.  The various methods of treatment have been discussed with the patient and family. After consideration of risks, benefits and other options for treatment, the patient has consented to  Procedure(s): RIGHT/LEFT HEART CATH AND CORONARY ANGIOGRAPHY (N/A) as a surgical intervention.  The patient's history has been reviewed, patient examined, no change in status, stable for surgery.  I have reviewed the patient's chart and labs.  Questions were answered to the patient's satisfaction.    Cath Lab Visit (complete for each Cath Lab visit)  Clinical Evaluation Leading to the Procedure:   ACS: No.  Non-ACS:    Anginal/Heart Failure Classification: NYHA class IV  Anti-ischemic medical therapy: Maximal Therapy (2 or more classes of medications)  Non-Invasive Test Results: LVEF 25-30% -> high risk  Prior CABG: No previous CABG  Chase Combs

## 2024-01-17 NOTE — Plan of Care (Signed)

## 2024-01-17 NOTE — Progress Notes (Signed)
 Mobility Specialist - Progress Note  During mobility: 130 bpm HR,  Post-mobility: 106 bpm HR,    01/17/24 0907  Mobility  Activity Ambulated with assistance  Level of Assistance Standby assist, set-up cues, supervision of patient - no hands on  Assistive Device Front wheel walker  Distance Ambulated (ft) 200 ft  Range of Motion/Exercises Active  Activity Response Tolerated well  Mobility Referral Yes  Mobility visit 1 Mobility  Mobility Specialist Start Time (ACUTE ONLY) 0845  Mobility Specialist Stop Time (ACUTE ONLY) 0907  Mobility Specialist Time Calculation (min) (ACUTE ONLY) 22 min   Pt was found in bed and agreeable to ambulate. C/o LLE stiffness. At EOS returned to use [facility] for BM prior to returning to bed. At EOS was left with all needs met. Call bell in reach.  Erminio Leos,  Mobility Specialist Can be reached via Secure Chat

## 2024-01-17 NOTE — Progress Notes (Addendum)
 Progress Note  Patient Name: Chase Combs Date of Encounter: 01/17/2024 [facility] Cardiologist: Alm Clay, MD   Interval Summary   Reports he is making some improvement, swelling has gone down.  Plan for right and left heart catheterization today.  Vital Signs Vitals:   01/16/24 1939 01/16/24 2119 01/17/24 0526 01/17/24 0735  BP:  137/89 (!) 126/93   Pulse:  (!) 52 66   Resp:  15 15   Temp:  98.2 F (36.8 C) 98.1 F (36.7 C)   TempSrc:  Oral Oral   SpO2: 92% 92% 91% 93%  Weight:      Height:        Intake/Output Summary (Last 24 hours) at 01/17/2024 0945 Last data filed at 01/17/2024 0529 Gross per 24 hour  Intake 250 ml  Output 2550 ml  Net -2300 ml      01/15/2024    5:00 AM 01/14/2024    5:00 AM 01/13/2024    5:00 AM  Last 3 Weights  Weight (lbs) 212 lb 10.1 oz 212 lb 11.9 oz 213 lb 6.5 oz  Weight (kg) 96.45 kg 96.5 kg 96.8 kg      Telemetry/ECG  Sinus rhythm with frequent ectopy PACs/PVCs, short bursts NSVT- Personally Reviewed  Physical Exam  GEN: No acute distress.   Neck: mild JVD Cardiac: RRR, no murmurs, rubs, or gallops.  Respiratory: +crackles GI: Soft, distended  MS: mild edema  Patient Profile Patient with past medical history significant for CAD with inferior STEMI 2010, ischemic cardiomyopathy, hypertension, hyperlipidemia, type 2 diabetes, OSA not on CPAP.  Currently here with metapneumovirus.  Cardiology seen for drop in EF and chest pain with mildly elevated troponins.  Assessment & Plan   Ischemic cardiomyopathy with reduced EF - EF 40 to 45%, RV function normal 06/2023 - Lexiscan  06/2023 fixed inferior defect consistent with prior infarct. Presented with nonspecific chest pain and CHF exacerbation.  Troponins 51-65.  Echocardiogram with further reduction of EF 25 to 30% with moderately reduced RV function.  Severely elevated PASP, 68.3 mmHg.   This is new.  Mild MR.  Still volume up, -2.5 L.  Plan for right and left heart  catheterization today. Continue with IV Lasix  40 mg twice daily, right heart cath results will help guide diuresis and give further details on his pulmonary hypertension.  Maybe related to drop in EF and/or OSA not on CPAP. GDMT: Continue with Toprol -XL 25 mg, Entresto  24-26 mg, plan to start SGLT2 inhibitor post catheterization. No PE on CTA  CAD - Unstable angina BMS to RCA 2006 - Inferior STEMI 2010 with ISR with BMS to RCA  Had reassuring stress test earlier this year but with drop in EF now planned for cardiac catheterization. Not having chest pain currently.  Continue with aspirin , Toprol -XL, rosuvastatin  20 mg. Will update LDL.  Goal should be less than 55.   NSVT, PVCs, PACs Frequent ectopy on telemetry.  Would like to see how he does after sufficient diuresis and once he improves from a viral standpoint.  May consider heart monitor at discharge. Continue with amiodarone  200 mg daily (new this admission), Toprol -XL.  Amiodarone  monitoring: LFTs, chest x-ray obtained this admission. Get TSH. If using long-term needs to get PFTs  Metapneumovirus/pneumonia  Per primary.  Informed Consent   Shared Decision Making/Informed Consent{  The risks [stroke (1 in 1000), death (1 in 1000), kidney failure [usually temporary] (1 in 500), bleeding (1 in 200), allergic reaction [possibly serious] (1 in 200)], benefits (  diagnostic support and management of coronary artery disease) and alternatives of a cardiac catheterization were discussed in detail with Mr. Wolke and he is willing to proceed.     For questions or updates, please contact [facility] Please consult [email or web address] for contact info under       Signed, Thom LITTIE Sluder, PA-C     I have seen and examined the patient along with Thom LITTIE Sluder, PA-C.  I have reviewed the chart, notes and new data.  I agree with PA/NP's note.  Key new complaints: Seen immediately following his cardiac catheterization.  He does not have  angina.  His shortness of breath is markedly improved compared to admission and he no longer requires oxygen. Key examination changes: A little oozing at the radial artery access site, TR band still on.  Otherwise normal cardiovascular examination. Key new findings / data: Right heart catheterization showed only mildly pulmonary artery pressure and upper normal left heart filling pressures with normal cardiac output suggesting that the PAP abnormalities on echo were related to hypervolemia.  He had a severe stenosis in the proximal LAD artery but the previously placed stents in the ramus intermedius and right coronary artery had only moderate in-stent restenosis.  Believe we will see improvement in LV function following revascularization.  PLAN: DAPT for 12 months. Aggressive lipid-lowering therapy to target LDL less than 55. 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  Discontinued amlodipine  and plan to maximize the dose of Entresto  he takes He has shown rapid improvement and might be ready for discharge as early as tomorrow.  Reassess in the morning.  Jerita Wimbush, MD, FACC [facility] [phone] 01/17/2024, 3:37 PM

## 2024-01-17 NOTE — Progress Notes (Addendum)
 PROGRESS NOTE    LAURIS SERVISS  FMW:[id number] DOB: Nov 20, 1945 DOA: 01/07/2024 PCP: Wonda Worth SQUIBB, PA  Brief Narrative:  78 y.o. 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  He was admitted to the hospital 5/17-10/27/2023 for opioid induced ileus versus SBO, possible colitis, and aspiration pneumonia.   Patient is presenting with a chief complaint of shortness of breath.  Reporting 3-day history of shortness of breath, cough, and rhinorrhea.  Also was reporting swelling of his legs for the past 3 months which has gotten progressively worse.  He does not take any diuretics at home.  He is reporting mild substernal chest pressure unclear when this started.  Denies vomiting, abdominal pain, or diarrhea.   Over course of hospitalization, He was found to have MetaPneumovirus but continued to be SOB. Lasix  was resumed and now he is on BID IV 40 mg.  Respiratory medications adjustments have been made. Repeat ECHO was done and showed worsening LVEF. Cardiology consulted and have adjusted some of his medications and he is now on amiodarone  and Entresto .   Patient is status post right and left cardiac cath 01/17/2024  Conclusions: Severe single-vessel coronary artery disease with 70-80% proximal LAD stenosis that is highly significant by hemodynamic assessment (RFR = 0.67).  Moderate LMCA disease (MLA 6.5 mm) and 40% proximal/mid RCA Denovo disease also noted. Patent ramus intermedius stent with 50% in-stent restenosis. Patent proximal RCA stent with 40-50% in-stent restenosis. Upper normal left heart filling pressures (PCWP and LVEDP 15 mmHg). Mildly elevated right heart and pulmonary artery pressures (mean RA 7, RV 47/7, mean PA 25 mmHg). Normal Fick cardiac output/index  (CO 6.7 L/min, CI 3.1 L/min/m). Successful IVUS-guided PCI to ostial/proximal LAD using Synergy XD 3.0 x 20 mm drug-eluting stent with 0% residual stenosis and TIMI-3 flow.   Recommendations: Dual antiplatelet therapy with aspirin  and ticagrelor  for at least 12 months. Aggressive secondary prevention of coronary artery disease. Maintain net even to slightly negative fluid balance. Continue escalation of goal-directed medical therapy for mixed ischemic and nonischemic cardiomyopathy. Keep at [facility] for ongoing management of CAD/CHF as well as pneumonia/bronchitis.  Assessment & Plan:   Principal Problem:   Pneumonia Active Problems:   CAD S/P percutaneous coronary angioplasty   Acute hypoxemic respiratory failure (HCC)   Acute exacerbation of CHF (congestive heart failure) (HCC)   Chest pain   Ischemic cardiomyopathy  Pneumonia due to metapneumovirus with sepsis Acute Hypoxemic respiratory failure  in the setting of below -Right sided infiltrate (similar to last admission in May) - Procalcitonin < 0.10 - 8/16> Metapneumovirus positive  - antibiotics stopped  -Continue Oxygen, Guaifenesin / Dextromethorphan  and Tussionex- he is allergic to Codeine.  - added Mucomyst  nebs and chest PT; adjusted breathing treatments and will place on Xopenex , Atrovent , Brovana  and budesonide ;taper steroids. - Does not wear oxygen at home will need to continue to monitor respiratory status carefully and continue with flutter valve, incentive spirometry and guaifenesin  1200 mg twice daily NOW ON RA -Repeat CXR this AM done and showed Similar patchy airspace disease in the right lung base with progressive clearing in the right upper lung zone. Small left pleural effusion with streaky atelectasis in the left lung base. -CT scan without contrast done yesterday and showed Airspace disease throughout the right lung with some improvement in  the right upper lobe, unchanged in the right middle lobe and right  lower lobe. Increasing airspace disease in the left lower lobe. Findings compatible with multifocal pneumonia. Small left pleural effusion and trace right pleural effusion, stable since prior study. Cardiomegaly, three-vessel coronary artery disease. Aortic Atherosclerosis -CTA PE Protocol as below ruled out PE -Obtained SLP evaluation and they are recommending regular diet with thin liquids.   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  -Repeat Echocardiogram showed LVEF of 25-30% with severely decreased Fxn, LV Global Hypokinesis, and LVDP consistent w/ Grade 1 DD and RV was moderately reduced with Severely elevated Pulmonary Artery Systolic Pressure. -Checked CTA Chest PE protocol to assess Decreased RV Fxn to r/o PE and showed no evidence of significant pulmonary embolus but did show persistent diffuse right lung infiltrates with mild improvement since the prior study as well as persistent left pleural effusion and bilateral basilar consolidation or atelectasis which was similar.  Patient continues to have cardiac enlargement and aortic atherosclerosis. -Continue Diuresis w/ IV Furosemide  40 mg BID  -Patient had a nuclear stress test on 07/12/23 that was reassuring and negative for ischemia -Cardiology was consulted s/p  R and L heart cath on Monday 01/17/24 after diuresis and starting Entresto  low-dose.results as above.  -Irbesartan  300 mg po daily changed to Entresto  24-26 mg/tab. 1 tab p.o. twice daily.  Continue Toprol  12.5 mg po daily; also continue with aspirin  81 mg p.o. daily and Rosuvastatin  20 mg p.o. daily -Troponin 51, likely due to demand ischemia Negative 2400 last 24 hrs -Will need Ambulatory home O2 screen and Repeat CXR prior to discharge   NSVT, PVCs and PACs: Continue w/ Metoprolol  Succinate 25 mg po  Daily and correct electrolytes as needed. Patient had noted ectopy on telemetry with PVCs and NSVT.  Cardiology added low-dose Amiodarone  200 mg p.o. daily as well. CTM on Telemetry   Essential HTN: Held Amlodipine  for now as BP has been either normal or low; he was continued on Metoprolol  Succinate 25 mg p.o. daily; Irbesartan  300 mg daily is now being changed to a Sacubitril -Valsartan  24-26 mg/tab. 1 tab p.o. twice daily.  Continue monitor blood pressures per protocol.  Last blood pressure reading was 119/68   Hyperlipidemia: C/w Rosuvastatin  20 mg po daily but may need to hold if LFTs continue to worsen   Elevated ALT: . Repeat CMP in the AM  BPH: C/w Tamsulosin  0.4 mg po Daily    Normocytic Anemia: stable   Overweight: Complicates overall prognosis and care. Estimated body mass index is 28.84 kg/m as calculated from the following:   Height as of this encounter: 6' (1.829 m).   Weight as of this encounter: 96.5 kg. Weight Loss and Dietary Counseling given     Estimated body mass index is 28.84 kg/m as calculated from the following:   Height as of this encounter: 6' (1.829 m).   Weight as of this encounter: 96.5 kg.  DVT prophylaxis: Lovenox   code Status: Full code  family Communication: None at bedside  disposition Plan:  Status is: Inpatient Remains inpatient appropriate because: Acute illness   Consultants:  Cardiology  Procedures: Heart cath 01/17/2024 Antimicrobials: Anti-infectives (From admission, onward)    Start     Dose/Rate Route Frequency Ordered Stop   01/08/24 0115  ceFEPIme  (MAXIPIME ) 2 g in sodium chloride  0.9 %  100 mL IVPB  Status:  Discontinued        2 g 200 mL/hr over 30 Minutes Intravenous Every 8 hours 01/08/24 0114 01/08/24 1425   01/08/24 0115  linezolid  (ZYVOX ) IVPB 600 mg  Status:  Discontinued        600 mg 300 mL/hr over 60 Minutes Intravenous Every 12 hours 01/08/24 0114 01/08/24 1425        Subjective: I saw him prior to the cath he was  sitting up by the side of the bed slept okay had a bowel movement this morning does have a cough with white phlegm on room air  Objective: Vitals:   01/17/24 1430 01/17/24 1500 01/17/24 1510 01/17/24 1600  BP: (!) 135/94 118/89  119/68  Pulse: (!) 103 82 68 87  Resp: 17 16 16 19   Temp:      TempSrc:      SpO2: 95% 95% 90% 95%  Weight:      Height:        Intake/Output Summary (Last 24 hours) at 01/17/2024 1633 Last data filed at 01/17/2024 1237 Gross per 24 hour  Intake 250 ml  Output 2650 ml  Net -2400 ml   Filed Weights   01/13/24 0500 01/14/24 0500 01/15/24 0500  Weight: 96.8 kg 96.5 kg 96.5 kg    Examination:  General exam: Appears in nad Respiratory system: crackles at bases Respiratory effort normal. Cardiovascular system: reg Gastrointestinal system: Abdomen is ndistended, soft and nontender. No organomegaly or masses felt. Normal bowel sounds heard. Central nervous system: Alert and oriented. No focal neurological deficits. Extremities: 1 plus edema    Data Reviewed: I have personally reviewed following labs and imaging studies  CBC: Recent Labs  Lab 01/13/24 0537 01/14/24 0526 01/15/24 0633 01/16/24 0525 01/17/24 0541  WBC 4.3 8.3 9.3 9.4 11.8*  NEUTROABS 3.8 7.5 8.5* 8.5* 10.6*  HGB 11.0* 11.1* 11.1* 11.5* 13.3  HCT 37.6* 37.1* 36.9* 37.1* 45.1  MCV 95.4 93.2 92.9 91.8 97.8  PLT 257 312 361 349 420*   Basic Metabolic Panel: Recent Labs  Lab 01/13/24 0537 01/14/24 0526 01/15/24 0633 01/16/24 0525 01/17/24 0541  NA 135 135 136 134* 132*  K 4.4 3.7 3.7 3.7 3.6  CL 95* 92* 88* 88* 89*  CO2 30 31 37* 35* 30  GLUCOSE 200* 204* 235* 243* 243*  BUN 13 18 21 20 23   CREATININE 0.56* 0.66 0.75 0.71 0.59*  CALCIUM  8.5* 8.8* 8.9 8.4* 8.7*  MG 2.1 2.1 2.2 2.1 2.2  PHOS 3.9 4.4 4.7* 3.6 3.7   GFR: Estimated Creatinine Clearance: 94.7 mL/min (A) (by C-G formula based on SCr of 0.59 mg/dL (L)). Liver Function Tests: Recent Labs  Lab 01/13/24 0537  01/14/24 0526 01/15/24 0633 01/16/24 0525 01/17/24 0541  AST 26 26 37 35 37  ALT 31 35 50* 60* 75*  ALKPHOS 55 59 62 51 66  BILITOT 0.8 0.6 0.5 0.6 0.7  PROT 5.8* 6.3* 6.3* 5.6* 6.2*  ALBUMIN 2.8* 3.1* 3.3* 2.8* 3.2*   No results for input(s): LIPASE, AMYLASE in the last 168 hours. No results for input(s): AMMONIA in the last 168 hours. Coagulation Profile: No results for input(s): INR, PROTIME in the last 168 hours. Cardiac Enzymes: No results for input(s): CKTOTAL, CKMB, CKMBINDEX, TROPONINI in the last 168 hours. BNP (last 3 results) Recent Labs    01/07/24 2301  PROBNP 15,245.0*   HbA1C: No results for input(s): HGBA1C in the last 72 hours. CBG: No results for input(s):  GLUCAP in the last 168 hours. Lipid Profile: No results for input(s): CHOL, HDL, LDLCALC, TRIG, CHOLHDL, LDLDIRECT in the last 72 hours. Thyroid  Function Tests: No results for input(s): TSH, T4TOTAL, FREET4, T3FREE, THYROIDAB in the last 72 hours. Anemia Panel: No results for input(s): VITAMINB12, FOLATE, FERRITIN, TIBC, IRON, RETICCTPCT in the last 72 hours. Sepsis Labs: No results for input(s): PROCALCITON, LATICACIDVEN in the last 168 hours.  Recent Results (from the past 240 hours)  Culture, blood (Routine X 2) w Reflex to ID Panel     Status: None   Collection Time: 01/08/24  1:10 AM   Specimen: BLOOD LEFT FOREARM  Result Value Ref Range Status   Specimen Description   Final    BLOOD LEFT FOREARM Performed at [facility], [address] [place], [place] [address]    Special Requests   Final    BOTTLES DRAWN AEROBIC AND ANAEROBIC Blood Culture adequate volume Performed at [facility], [address], [place], [place] [address]    Culture   Final    NO GROWTH 5 DAYS Performed at [facility], [address] [place], [place] [address]    Report Status 01/13/2024 FINAL  Final   Culture, blood (Routine X 2) w Reflex to ID Panel     Status: None   Collection Time: 01/08/24  1:20 AM   Specimen: BLOOD LEFT HAND  Result Value Ref Range Status   Specimen Description   Final    BLOOD LEFT HAND Performed at [facility], [address], [place], [place] [address]    Special Requests   Final    BOTTLES DRAWN AEROBIC AND ANAEROBIC Blood Culture results may not be optimal due to an inadequate volume of blood received in culture bottles Performed at [facility], [address], [place], [place] [address]    Culture   Final    NO GROWTH 5 DAYS Performed at [facility], [address], [place], [place] [address]    Report Status 01/13/2024 FINAL  Final  Resp panel by RT-PCR (RSV, Flu A&B, Covid) Anterior Nasal Swab     Status: None   Collection Time: 01/08/24  1:29 AM   Specimen: Anterior Nasal Swab  Result Value Ref Range Status   SARS Coronavirus 2 by RT PCR NEGATIVE NEGATIVE Final    Comment: (NOTE) SARS-CoV-2 target nucleic acids are NOT DETECTED.  The SARS-CoV-2 RNA is generally detectable in upper respiratory specimens during the acute phase of infection. The lowest concentration of SARS-CoV-2 viral copies this assay can detect is 138 copies/mL. A negative result does not preclude SARS-Cov-2 infection and should not be used as the sole basis for treatment or other patient management decisions. A negative result may occur with  improper specimen collection/handling, submission of specimen other than nasopharyngeal swab, presence of viral mutation(s) within the areas targeted by this assay, and inadequate number of viral copies(<138 copies/mL). A negative result must be combined with clinical observations, patient history, and epidemiological information. The expected result is Negative.  Fact Sheet for Patients:  BloggerCourse.com  Fact Sheet for Healthcare Providers:   SeriousBroker.it  This test is no t yet approved or cleared by the [place]  FDA and  has been authorized for detection and/or diagnosis of SARS-CoV-2 by FDA under an Emergency Use Authorization (EUA). This EUA will remain  in effect (meaning this test can be used) for the duration of the COVID-19 declaration under Section 564(b)(1) of the Act, 21  U.S.C.section 360bbb-3(b)(1), unless the authorization is terminated  or revoked sooner.       Influenza A by PCR NEGATIVE NEGATIVE Final   Influenza B by PCR NEGATIVE NEGATIVE Final    Comment: (NOTE) The Xpert Xpress SARS-CoV-2/FLU/RSV plus assay is intended as an aid in the diagnosis of influenza from Nasopharyngeal swab specimens and should not be used as a sole basis for treatment. Nasal washings and aspirates are unacceptable for Xpert Xpress SARS-CoV-2/FLU/RSV testing.  Fact Sheet for Patients: BloggerCourse.com  Fact Sheet for Healthcare Providers: SeriousBroker.it  This test is not yet approved or cleared by the [place]  FDA and has been authorized for detection and/or diagnosis of SARS-CoV-2 by FDA under an Emergency Use Authorization (EUA). This EUA will remain in effect (meaning this test can be used) for the duration of the COVID-19 declaration under Section 564(b)(1) of the Act, 21 U.S.C. section 360bbb-3(b)(1), unless the authorization is terminated or revoked.     Resp Syncytial Virus by PCR NEGATIVE NEGATIVE Final    Comment: (NOTE) Fact Sheet for Patients: BloggerCourse.com  Fact Sheet for Healthcare Providers: SeriousBroker.it  This test is not yet approved or cleared by the [place]  FDA and has been authorized for detection and/or diagnosis of SARS-CoV-2 by FDA under an Emergency Use Authorization (EUA). This EUA will remain in effect (meaning this test can be used) for  the duration of the COVID-19 declaration under Section 564(b)(1) of the Act, 21 U.S.C. section 360bbb-3(b)(1), unless the authorization is terminated or revoked.  Performed at [facility], [address] [place], [place] [address]   Respiratory (~20 pathogens) panel by PCR     Status: Abnormal   Collection Time: 01/08/24  9:54 AM   Specimen: Nasopharyngeal Swab; Respiratory  Result Value Ref Range Status   Adenovirus NOT DETECTED NOT DETECTED Final   Coronavirus 229E NOT DETECTED NOT DETECTED Final    Comment: (NOTE) The Coronavirus on the Respiratory Panel, DOES NOT test for the novel  Coronavirus (2019 nCoV)    Coronavirus HKU1 NOT DETECTED NOT DETECTED Final   Coronavirus NL63 NOT DETECTED NOT DETECTED Final   Coronavirus OC43 NOT DETECTED NOT DETECTED Final   Metapneumovirus DETECTED (A) NOT DETECTED Final   Rhinovirus / Enterovirus NOT DETECTED NOT DETECTED Final   Influenza A NOT DETECTED NOT DETECTED Final   Influenza B NOT DETECTED NOT DETECTED Final   Parainfluenza Virus 1 NOT DETECTED NOT DETECTED Final   Parainfluenza Virus 2 NOT DETECTED NOT DETECTED Final   Parainfluenza Virus 3 NOT DETECTED NOT DETECTED Final   Parainfluenza Virus 4 NOT DETECTED NOT DETECTED Final   Respiratory Syncytial Virus NOT DETECTED NOT DETECTED Final   Bordetella pertussis NOT DETECTED NOT DETECTED Final   Bordetella Parapertussis NOT DETECTED NOT DETECTED Final   Chlamydophila pneumoniae NOT DETECTED NOT DETECTED Final   Mycoplasma pneumoniae NOT DETECTED NOT DETECTED Final    Comment: Performed at [facility], [address], [place], [place] [address]         Radiology Studies: CARDIAC CATHETERIZATION Result Date: 01/17/2024 Conclusions: Severe single-vessel coronary artery disease with 70-80% proximal LAD stenosis that is highly significant by hemodynamic assessment (RFR = 0.67).  Moderate LMCA disease (MLA 6.5 mm) and 40% proximal/mid RCA Denovo  disease also noted. Patent ramus intermedius stent with 50% in-stent restenosis. Patent proximal RCA stent with 40-50% in-stent restenosis. Upper normal left heart filling pressures (PCWP and LVEDP 15 mmHg). Mildly elevated right heart and pulmonary artery pressures (mean RA  7, RV 47/7, mean PA 25 mmHg). Normal Fick cardiac output/index (CO 6.7 L/min, CI 3.1 L/min/m). Successful IVUS-guided PCI to ostial/proximal LAD using Synergy XD 3.0 x 20 mm drug-eluting stent with 0% residual stenosis and TIMI-3 flow. Recommendations: Dual antiplatelet therapy with aspirin  and ticagrelor  for at least 12 months. Aggressive secondary prevention of coronary artery disease. Maintain net even to slightly negative fluid balance. Continue escalation of goal-directed medical therapy for mixed ischemic and nonischemic cardiomyopathy. Keep at [facility] for ongoing management of CAD/CHF as well as pneumonia/bronchitis. Lonni Hanson, MD [facility]  DG CHEST PORT 1 VIEW Result Date: 01/17/2024 CLINICAL DATA:  Short of breath EXAM: PORTABLE CHEST 1 VIEW COMPARISON:  None Available. FINDINGS: Normal cardiac silhouette. Bibasilar atelectasis similar to prior. Small bilateral pleural effusions. Slight improvement aeration of the lung bases. No pneumothorax IMPRESSION: Mild improvement in basilar atelectasis. Electronically Signed   By: Jackquline Boxer M.D.   On: 01/17/2024 09:09   DG CHEST PORT 1 VIEW Result Date: 01/16/2024 CLINICAL DATA:  [id number] SOB (shortness of breath) [id number] EXAM: PORTABLE CHEST - 1 VIEW COMPARISON:  01/15/2024 FINDINGS: Right perihilar and bibasilar airspace opacities slightly increased from previous. Blunting of lateral costophrenic angles suggesting effusions. Right glenohumeral and left AC DJD. Heart size and mediastinal contours are within normal limits. IMPRESSION: Slight increase in right perihilar and bibasilar airspace disease. Electronically Signed   By: JONETTA Faes M.D.   On: 01/16/2024 11:03         Scheduled Meds:  acetaminophen        [MAR Hold] amiodarone   200 mg Oral Daily   [MAR Hold] arformoterol   15 mcg Nebulization BID   [MAR Hold] aspirin  EC  81 mg Oral Daily   [MAR Hold] benzonatate   200 mg Oral TID   [MAR Hold] budesonide  (PULMICORT ) nebulizer solution  0.25 mg Nebulization BID   [MAR Hold] enoxaparin  (LOVENOX ) injection  40 mg Subcutaneous Q24H   [MAR Hold] furosemide   40 mg Intravenous BID   [MAR Hold] guaiFENesin   1,200 mg Oral BID   [MAR Hold] guaiFENesin   15 mL Oral Once   [MAR Hold] ipratropium  0.5 mg Nebulization TID   [MAR Hold] levalbuterol   0.63 mg Nebulization TID   [MAR Hold] methylPREDNISolone  (SOLU-MEDROL ) injection  40 mg Intravenous Q12H   [MAR Hold] metoprolol  succinate  25 mg Oral Daily   nitroGLYCERIN        ondansetron        [MAR Hold] rosuvastatin   20 mg Oral Daily   sacubitril -valsartan   1 tablet Oral BID   [MAR Hold] tamsulosin   0.4 mg Oral QPC supper   ticagrelor   90 mg Oral BID   Continuous Infusions:  sodium chloride        LOS: 9 days    Almarie KANDICE Hoots, MD8/25/2025, 4:33 PM

## 2024-01-18 ENCOUNTER — Encounter (HOSPITAL_COMMUNITY): Payer: Self-pay | Admitting: Internal Medicine

## 2024-01-18 ENCOUNTER — Other Ambulatory Visit (HOSPITAL_COMMUNITY): Payer: Self-pay

## 2024-01-18 DIAGNOSIS — E876 Hypokalemia: Secondary | ICD-10-CM | POA: Diagnosis not present

## 2024-01-18 DIAGNOSIS — I25118 Atherosclerotic heart disease of native coronary artery with other forms of angina pectoris: Secondary | ICD-10-CM

## 2024-01-18 DIAGNOSIS — I5043 Acute on chronic combined systolic (congestive) and diastolic (congestive) heart failure: Secondary | ICD-10-CM | POA: Diagnosis not present

## 2024-01-18 DIAGNOSIS — I4729 Other ventricular tachycardia: Secondary | ICD-10-CM

## 2024-01-18 DIAGNOSIS — E785 Hyperlipidemia, unspecified: Secondary | ICD-10-CM

## 2024-01-18 LAB — CBC
HCT: 37.9 % — ABNORMAL LOW (ref 39.0–52.0)
Hemoglobin: 12.3 g/dL — ABNORMAL LOW (ref 13.0–17.0)
MCH: 28.9 pg (ref 26.0–34.0)
MCHC: 32.5 g/dL (ref 30.0–36.0)
MCV: 89 fL (ref 80.0–100.0)
Platelets: 406 K/uL — ABNORMAL HIGH (ref 150–400)
RBC: 4.26 MIL/uL (ref 4.22–5.81)
RDW: 16.1 % — ABNORMAL HIGH (ref 11.5–15.5)
WBC: 10.3 K/uL (ref 4.0–10.5)
nRBC: 0 % (ref 0.0–0.2)

## 2024-01-18 LAB — BASIC METABOLIC PANEL WITH GFR
Anion gap: 11 (ref 5–15)
BUN: 24 mg/dL — ABNORMAL HIGH (ref 8–23)
CO2: 33 mmol/L — ABNORMAL HIGH (ref 22–32)
Calcium: 8.4 mg/dL — ABNORMAL LOW (ref 8.9–10.3)
Chloride: 88 mmol/L — ABNORMAL LOW (ref 98–111)
Creatinine, Ser: 0.78 mg/dL (ref 0.61–1.24)
GFR, Estimated: >60 mL/min (ref >60)
Glucose, Bld: 169 mg/dL — ABNORMAL HIGH (ref 70–99)
Potassium: 3.2 mmol/L — ABNORMAL LOW (ref 3.5–5.1)
Sodium: 132 mmol/L — ABNORMAL LOW (ref 135–145)

## 2024-01-18 LAB — LIPID PANEL
Cholesterol: 109 mg/dL (ref 0–200)
HDL: 37 mg/dL — ABNORMAL LOW (ref >40)
LDL Cholesterol: 49 mg/dL (ref 0–99)
Total CHOL/HDL Ratio: 2.9 ratio
Triglycerides: 115 mg/dL (ref <150)
VLDL: 23 mg/dL (ref 0–40)

## 2024-01-18 LAB — TSH: TSH: 2.402 u[IU]/mL (ref 0.350–4.500)

## 2024-01-18 MED ORDER — SPIRONOLACTONE 12.5 MG HALF TABLET
12.5000 mg | ORAL_TABLET | Freq: Every day | ORAL | Status: DC
  Administered 2024-01-18 – 2024-01-19 (×2): 12.5 mg via ORAL
  Filled 2024-01-18 (×2): qty 1

## 2024-01-18 MED ORDER — POTASSIUM CHLORIDE CRYS ER 20 MEQ PO TBCR
40.0000 meq | EXTENDED_RELEASE_TABLET | Freq: Once | ORAL | Status: AC
  Administered 2024-01-18: 40 meq via ORAL
  Filled 2024-01-18: qty 2

## 2024-01-18 NOTE — Plan of Care (Signed)

## 2024-01-18 NOTE — Progress Notes (Signed)
 Mobility Specialist Progress Note;   01/18/24 1007  Mobility  Activity Ambulated with assistance  Level of Assistance Standby assist, set-up cues, supervision of patient - no hands on  Assistive Device Front wheel walker  Distance Ambulated (ft) 250 ft  Activity Response Tolerated well  Mobility Referral Yes  Mobility visit 1 Mobility  Mobility Specialist Start Time (ACUTE ONLY) 1007  Mobility Specialist Stop Time (ACUTE ONLY) 1019  Mobility Specialist Time Calculation (min) (ACUTE ONLY) 12 min   Pt agreeable to mobility. On 2LO2 upon arrival. Required no physical assistance during ambulation, SV for safety. Attempted ambulation on RA, however SPO2 desat to 86% requiring 2LO2 to maintain SPO2 92%>. No c/o during session. Pt returned to sitting on EoB with all needs met. RN in room.   Lauraine Erm Mobility Specialist Please contact via SecureChat or [employer] [phone]

## 2024-01-18 NOTE — Progress Notes (Signed)
 Progress Note  Patient Name: Chase Combs Date of Encounter: 01/18/2024 [facility] Cardiologist: Alm Clay, MD   Interval Summary   Denies chest pain and shortness of breath at rest.  Still has a productive cough, lots of secretions. On O2 2 L by nasal cannula his oxygen saturation is 97%, but after a few minutes off O2 his oxygen saturation is in the 89-91% range. Small bruise at wrist area, but no hematoma and normal perfusion of the right hand. Very frequent PVCs, moderate hypokalemia.  Vital Signs Vitals:   01/18/24 0728 01/18/24 0730 01/18/24 0735 01/18/24 0800  BP:    128/88  Pulse:      Resp:    18  Temp:    98.8 F (37.1 C)  TempSrc:    Oral  SpO2: 98% 98% 97% 97%  Weight:      Height:        Intake/Output Summary (Last 24 hours) at 01/18/2024 0836 Last data filed at 01/18/2024 0513 Gross per 24 hour  Intake --  Output 1700 ml  Net -1700 ml      01/18/2024    5:01 AM 01/15/2024    5:00 AM 01/14/2024    5:00 AM  Last 3 Weights  Weight (lbs) 193 lb 8 oz 212 lb 10.1 oz 212 lb 11.9 oz  Weight (kg) 87.771 kg 96.45 kg 96.5 kg      Telemetry/ECG  Sinus rhythm with very frequent PVCs, ventricular couplets and occasional 3-4 beat runs of nonsustained ventricular tachycardia- Personally Reviewed  Physical Exam  GEN: No acute distress.   Neck: No JVD Cardiac: Frequent ectopy on a background of RRR, no murmurs, rubs, or gallops.  Respiratory: Crackles in right lung base. GI: Soft, nontender, non-distended  MS: No edema.  Small hemostasis extending across distal third of the right forearm, without palpable hematoma and with excellent perfusion of the right hand  Assessment & Plan  CHF: Newly reduced LVEF from previous baseline of roughly 45% down to 25-30% including moderately reduced RV systolic function by echocardiography.  The right heart catheterization today showed mild pulmonary hypertension and PAWP at the upper limit of normal following  diuresis.  After we correct his hypokalemia I think we'll place him on a low-dose of oral loop diuretic and he would also benefit from SGLT2 inhibitor (but this can wait until he has an outpatient).  He is on sacubitril -valsartan  and metoprolol  succinate.  Will add mineralocorticoid antagonist and a very low-dose since his blood pressure was fairly soft overnight.  Recheck LVEF once we have finished titrating heart failure medications. CAD: s/p PCI-DES to the proximal LAD artery this admission 01/17/2024.  Understands the need for uninterrupted dual antiplatelet therapy for at least the next 6 months.  He will have to delay planned knee replacement surgery until the end of next February.  On high-dose statin, on beta-blocker.  Reevaluate LVEF down the road. NSVT: Aggressively correct electrolyte imbalances.  Continue beta-blocker and amiodarone . HLP: Target LDL less than 75, was 49 on this admission.  Chronically low HDL. Pneumonia/acute human metapneumovirus infection: He had normal wedge pressure on heart catheterization yesterday.  The residual hypoxemia cannot be attributed to heart failure and is likely due to infection and probably some degree of atelectasis.  Encourage incentive spirometry and mobilization.  Hopefully we can get him off the oxygen supplementation.  If oxygenation normalizes then he can transition to room air.  From a cardiology point of view he would be ready for discharge  later today.   For questions or updates, please contact [facility] Please consult [email or web address] for contact info under       Signed, Jerel Balding, MD

## 2024-01-18 NOTE — TOC Benefit Eligibility Note (Signed)
 Pharmacy Patient Advocate Encounter  Insurance verification completed.    The patient is insured through [employer] Part D. Patient has Medicare and is not eligible for a copay card, but may be able to apply for patient assistance or Medicare RX Payment Plan (Patient Must reach out to their plan, if eligible for payment plan), if available.    Ran test claim for Brilinta  90mg  and the current 30 day co-pay is $170.69 ($123.69 to deductible and $47 copay) .  Ran test claim for Eliquis 5mg  and the current 30 day co-pay is $170.69 ($123.69 to deductible and $47 copay).  Ran test claim for Jardiance 10mg  and the current 30 day co-pay is $170.69 ($123.69 to deductible and $47 copay).  Ran test claim for Entresto  24-26mg  and the current 30 day co-pay is $170.69 ($123.69 to deductible and $47 copay).  Ran test claim for Farxiga 10mg  and the current 30 day co-pay is $170.69 ($123.69 to deductible and $47 copay).   This test claim was processed through [facility]- copay amounts may vary at other pharmacies due to pharmacy/plan contracts, or as the patient moves through the different stages of their insurance plan.

## 2024-01-18 NOTE — Progress Notes (Signed)
  Progress Note   Patient: Chase Combs FMW:[id number] DOB: 1945-11-18 DOA: 01/07/2024     10 DOS: the patient was seen and examined on 01/18/2024 at 11:30AM      Brief hospital course: 78 y.o. 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     Please see much longer summary by Dr. Sherrill from 8/24       Assessment and Plan: Acute respiratory failure due to metapneumovirus and acute CHF   Acute on chronic systolic and diastolic CHF Coronary artery disease Admitted and found to have rising BNP, persistent infiltrates on chest imaging.  Diuresed with IV Lasix , echocardiogram showed worsening EF, cardiology consulted.  Underwent RHC/LHC on 8/25 that revealed high normal left heart filling pressures after diuresis.  Also found to have severe stenosis proximal LAD that was stented. - Continue aspirin , Brilinta  - Continue Crestor  - Continue Entresto , spironolactone , metoprolol  - IV Lasix  started by Cardiology, defer transition to oral to Dr. Therese   Metapneumovirus pneumonia Seems to be weaning from last week (was on 5-7L O2 at [facility]).  Down to 0-1L O2 at rest today, but desaturated to 86% with ambulating on room air.  - Stop steroids and bronchodilators - IS/flutter - Supportive care - Attempt to wean O2   Hypertension BP soft on new GDMT - See above - Stop amlodipine , valsartan   Hyperlipidemia LDL at goal - Continue Crestor   Transaminitis ALT slightly up the other day.   - Repeat LFTs  Normocytic anemia Hgb improving, no clinical bleeding  BPH - Continue Flomax   NSVT - Continue amiodarone  - Supplement K, monitor mag  Hyponatremia Mild, asymptomatic -Trend BMP  Hypokalemia - Supplement potassium        Subjective:  Feeling improvement.  Still on O2 this morning.  Weaned off by cardiology but desaturated again with ambualtion.  No chest pain.     Physical Exam: BP 103/74 (BP Location: Left Arm)   Pulse 93   Temp 98.4 F (36.9 C) (Oral)   Resp 18   Ht 6' (1.829 m)   Wt 87.8 kg   SpO2 91%   BMI 26.24 kg/m   Adult male, lying in bed, nasal cannula in place, pleasant demeanor JVP not visible RRR, no murmurs, no pitting in the extremities Respiratory rate normal, rales at bases, no wheezing Abdomen protuberant, soft, no guarding or rebound Attention normal, affect pleasant, judgment and insight appear normal, face symmetric, speech fluent    Data Reviewed: Basic metabolic panel shows mild hyponatremia, hypokalemia, stable renal function CBC unremarkable     Family Communication:     Disposition: Status is: Inpatient 78 yo M admitted with metapneumovirus pneumonia, respiratory failure, found to have CHF.  Underwent RHC, LHC this week, found to have high-grade LAD stenosis, now status post stent.  He is close to weaned off of oxygen, but still desaturating today.  I suspect in the next 24 hours, he will improve, cardiology can transition to oral Lasix  and he can discharge home        Author: Lonni SHAUNNA Dalton, MD 01/18/2024 6:20 PM  For on call review [email or web address].

## 2024-01-18 NOTE — Discharge Instructions (Signed)
 Information about your medication: Brilinta (anti-platelet agent)  Generic Name (Brand): ticagrelor (Brilinta), twice daily medication  PURPOSE: You are taking this medication along with aspirin to lower your chance of having a heart attack, stroke, or blood clots in your heart stent. These can be fatal. Brilinta and aspirin help prevent platelets from sticking together and forming a clot that can block an artery or your stent.   Common SIDE EFFECTS you may experience include: bruising or bleeding more easily, shortness of breath  Do not stop taking BRILINTA without talking to the doctor who prescribes it for you. People who are treated with a stent and stop taking Brilinta too soon, have a higher risk of getting a blood clot in the stent, having a heart attack, or dying. If you stop Brilinta because of bleeding, or for other reasons, your risk of a heart attack or stroke may increase.   Avoid taking NSAID agents or anti-inflammatory medications such as ibuprofen, naproxen given increased bleed risk with Brilinta - can use acetaminophen (Tylenol) if needed for pain.  Tell all of your doctors and dentists that you are taking Brilinta. They should talk to the doctor who prescribed Brilinta for you before you have any surgery or invasive procedure.   Contact your health care provider if you experience: severe or uncontrollable bleeding, pink/red/brown urine, vomiting blood or vomit that looks like "coffee grounds", red or black stools (looks like tar), coughing up blood or blood clots ----------------------------------------------------------------------------------------------------------------------

## 2024-01-18 NOTE — Plan of Care (Signed)

## 2024-01-18 NOTE — Progress Notes (Signed)
 Pt seen walking in hallway with MS.  Pt received HF book and was educated on LE edema and other concerning s/s, recording daily weights and when to call provider, sodium reduction (printout provided), importance of taking meds and physical activity, and fluid restriction.   Pt is unsure if he will do cardiac rehab.   [name] MS, ACSM-CEP  01/18/2024 10:44 AM

## 2024-01-19 ENCOUNTER — Ambulatory Visit: Admitting: Cardiology

## 2024-01-19 ENCOUNTER — Other Ambulatory Visit (HOSPITAL_COMMUNITY): Payer: Self-pay

## 2024-01-19 DIAGNOSIS — J189 Pneumonia, unspecified organism: Secondary | ICD-10-CM | POA: Diagnosis not present

## 2024-01-19 LAB — BASIC METABOLIC PANEL WITH GFR
Anion gap: 11 (ref 5–15)
BUN: 21 mg/dL (ref 8–23)
CO2: 34 mmol/L — ABNORMAL HIGH (ref 22–32)
Calcium: 8.5 mg/dL — ABNORMAL LOW (ref 8.9–10.3)
Chloride: 88 mmol/L — ABNORMAL LOW (ref 98–111)
Creatinine, Ser: 0.76 mg/dL (ref 0.61–1.24)
GFR, Estimated: >60 mL/min (ref >60)
Glucose, Bld: 169 mg/dL — ABNORMAL HIGH (ref 70–99)
Potassium: 3.5 mmol/L (ref 3.5–5.1)
Sodium: 133 mmol/L — ABNORMAL LOW (ref 135–145)

## 2024-01-19 LAB — LIPOPROTEIN A (LPA): Lipoprotein (a): 110.1 nmol/L — ABNORMAL HIGH (ref <75.0)

## 2024-01-19 LAB — MAGNESIUM: Magnesium: 2.2 mg/dL (ref 1.7–2.4)

## 2024-01-19 MED ORDER — SPIRONOLACTONE 25 MG PO TABS
12.5000 mg | ORAL_TABLET | Freq: Every day | ORAL | 1 refills | Status: DC
  Filled 2024-01-19: qty 30, 60d supply, fill #0

## 2024-01-19 MED ORDER — TICAGRELOR 90 MG PO TABS
90.0000 mg | ORAL_TABLET | Freq: Two times a day (BID) | ORAL | 1 refills | Status: DC
  Filled 2024-01-19: qty 60, 30d supply, fill #0

## 2024-01-19 MED ORDER — FUROSEMIDE 20 MG PO TABS
20.0000 mg | ORAL_TABLET | Freq: Every day | ORAL | 3 refills | Status: DC
  Filled 2024-01-19: qty 90, 90d supply, fill #0

## 2024-01-19 MED ORDER — POTASSIUM CHLORIDE CRYS ER 20 MEQ PO TBCR
40.0000 meq | EXTENDED_RELEASE_TABLET | Freq: Once | ORAL | Status: AC
  Administered 2024-01-19: 40 meq via ORAL
  Filled 2024-01-19: qty 2

## 2024-01-19 MED ORDER — SACUBITRIL-VALSARTAN 49-51 MG PO TABS
1.0000 | ORAL_TABLET | Freq: Two times a day (BID) | ORAL | 1 refills | Status: DC
Start: 2024-01-19 — End: 2024-02-25
  Filled 2024-01-19: qty 60, 30d supply, fill #0

## 2024-01-19 MED ORDER — METOPROLOL SUCCINATE ER 25 MG PO TB24
25.0000 mg | ORAL_TABLET | Freq: Every day | ORAL | 0 refills | Status: DC
  Filled 2024-01-19: qty 30, 30d supply, fill #0

## 2024-01-19 MED ORDER — AMIODARONE HCL 200 MG PO TABS
200.0000 mg | ORAL_TABLET | Freq: Every day | ORAL | 0 refills | Status: DC
  Filled 2024-01-19: qty 30, 30d supply, fill #0

## 2024-01-19 MED ORDER — FUROSEMIDE 20 MG PO TABS
20.0000 mg | ORAL_TABLET | Freq: Every day | ORAL | Status: DC
  Filled 2024-01-19: qty 1

## 2024-01-19 MED ORDER — NITROGLYCERIN 0.4 MG SL SUBL
0.4000 mg | SUBLINGUAL_TABLET | SUBLINGUAL | 3 refills | Status: DC | PRN
  Filled 2024-01-19: qty 25, 5d supply, fill #0

## 2024-01-19 NOTE — Progress Notes (Signed)
 SATURATION QUALIFICATIONS: (This note is used to comply with regulatory documentation for home oxygen)  Patient Saturations on Room Air at Rest = 93%  Patient Saturations on Room Air while Ambulating = 86%  Patient Saturations on 2 Liters of oxygen while Ambulating = 92%  Please briefly explain why patient needs home oxygen:  Patient's O2 drops when walking

## 2024-01-19 NOTE — TOC Transition Note (Signed)
 Transition of Care [facility]) - Discharge Note   Patient Details  Name: Chase Combs MRN: [id number] Date of Birth: 11/17/1945  Transition of Care [facility]) CM/SW Contact:  Sudie Erminio Deems, RN Phone Number: 01/19/2024, 10:43 AM   Clinical Narrative: Patient to transition home today. Order submitted for home oxygen. Patient did not have an agency preference. Referral submitted to [facility] for Oxygen. DME to be delivered to the room prior to transition home. No further home needs identified. Patient has transportation home.   Final next level of care: Home/Self Care Barriers to Discharge: No Barriers Identified   Patient Goals and CMS Choice Patient states their goals for this hospitalization and ongoing recovery are:: Plan to transition home  Discharge Plan and Services Additional resources added to the After Visit Summary for   In-house Referral: Clinical Social Work Discharge Planning Services: CM Consult Post Acute Care Choice: Durable Medical Equipment          DME Arranged: Oxygen DME Agency: [facility] Date DME Agency Contacted: 01/19/24 Time DME Agency Contacted: 1042 Representative spoke with at DME Agency: Ryan CHEADLE Arranged: NA   Social Drivers of Health (SDOH) Interventions SDOH Screenings   Food Insecurity: No Food Insecurity (01/08/2024)  Housing: Low Risk  (01/08/2024)  Transportation Needs: No Transportation Needs (01/08/2024)  Utilities: Not At Risk (01/08/2024)  Social Connections: Socially Integrated (01/08/2024)  Tobacco Use: Low Risk  (01/07/2024)   Readmission Risk Interventions    10/21/2023   11:36 AM  Readmission Risk Prevention Plan  Transportation Screening Complete  PCP or Specialist Appt within 5-7 Days Complete  Home Care Screening Complete  Medication Review (RN CM) Complete

## 2024-01-19 NOTE — Progress Notes (Signed)
 Occupational Therapy Treatment Patient Details Name: Chase Combs MRN: [id number] DOB: 01-10-1946 Today's Date: 01/19/2024   History of present illness Mr. Kroh is a 78 yr old male admitted to the hospital with shortness of breath, cough, and rhinorrhea. He was found to have acute hypoxemic respiratory failure due to CAP. PMH: CAD, MI s/p stent x2, HTN. HLD, prostate CA, OSA, prediabetes, L TKA in May 2025, chronic anemia   OT comments  Pt progressing well towards goals. Progressed to complete UB/LB bathing and dressing with s for safety. OT verbally educated pt on energy conservation strategies and use of flutter valve at home upon d/c, pt verbalized understanding. No follow up OT needs, will continue to follow acutely.       If plan is discharge home, recommend the following:  Assistance with cooking/housework;Assist for transportation   Equipment Recommendations  None recommended by OT       Precautions / Restrictions Precautions Precautions: Fall Recall of Precautions/Restrictions: Intact Restrictions Weight Bearing Restrictions Per Provider Order: No Other Position/Activity Restrictions: monitor O2       Mobility Bed Mobility Overal bed mobility: Modified Independent      Transfers Overall transfer level: Modified independent Equipment used: Rolling walker (2 wheels) Transfers: Sit to/from Stand Sit to Stand: Modified independent (Device/Increase time)           General transfer comment: Mod I STS, S for safety for mobility with RW     Balance Overall balance assessment: Needs assistance Sitting-balance support: Feet supported Sitting balance-Leahy Scale: Good     Standing balance support: Reliant on assistive device for balance, During functional activity Standing balance-Leahy Scale: Good         ADL either performed or assessed with clinical judgement   ADL Overall ADL's : Needs assistance/impaired         Upper Body Bathing: Set  up;Sitting   Lower Body Bathing: Supervison/ safety;Sit to/from stand   Upper Body Dressing : Set up;Sitting   Lower Body Dressing: Supervision/safety;Sit to/from stand   Toilet Transfer: Supervision/safety;Ambulation;BSC/3in1;Rolling walker (2 wheels) Toilet Transfer Details (indicate cue type and reason): Short step pivot BSC Toileting- Clothing Manipulation and Hygiene: Supervision/safety;Sit to/from stand Toileting - Clothing Manipulation Details (indicate cue type and reason): S for safety     Functional mobility during ADLs: Supervision/safety;Rolling walker (2 wheels) General ADL Comments: Began verbal education for energy conservation strategies    Extremity/Trunk Assessment Upper Extremity Assessment Upper Extremity Assessment: Generalized weakness   Lower Extremity Assessment Lower Extremity Assessment: Defer to PT evaluation        Vision   Vision Assessment?: No apparent visual deficits         Communication Communication Communication: No apparent difficulties Factors Affecting Communication: Hearing impaired   Cognition Arousal: Alert Behavior During Therapy: WFL for tasks assessed/performed Cognition: No apparent impairments         OT - Cognition Comments: Mild cog deficits including problem solving and STM but overall WFL       Following commands: Intact        Cueing   Cueing Techniques: Verbal cues        General Comments VSS on RA, loose BM    Pertinent Vitals/ Pain       Pain Assessment Pain Assessment: No/denies pain   Frequency  Min 1X/week        Progress Toward Goals  OT Goals(current goals can now be found in the care plan section)  Progress towards OT goals: Progressing toward  goals  Acute Rehab OT Goals OT Goal Formulation: With patient Time For Goal Achievement: 01/28/24 Potential to Achieve Goals: Good ADL Goals Pt Will Perform Grooming: with modified independence;standing Pt Will Perform Lower Body  Dressing: with modified independence;sit to/from stand Pt Will Transfer to Toilet: with modified independence;ambulating Pt Will Perform Toileting - Clothing Manipulation and hygiene: with modified independence;sit to/from stand  Plan         AM-PAC OT 6 Clicks Daily Activity     Outcome Measure   Help from another person eating meals?: None Help from another person taking care of personal grooming?: A Little Help from another person toileting, which includes using toliet, bedpan, or urinal?: A Little Help from another person bathing (including washing, rinsing, drying)?: A Little Help from another person to put on and taking off regular upper body clothing?: None Help from another person to put on and taking off regular lower body clothing?: A Little 6 Click Score: 20    End of Session Equipment Utilized During Treatment: Rolling walker (2 wheels)  OT Visit Diagnosis: Unsteadiness on feet (R26.81);Muscle weakness (generalized) (M62.81)   Activity Tolerance Patient tolerated treatment well   Patient Left in chair;with call bell/phone within reach   Nurse Communication Mobility status        Time: 1136-1203 OT Time Calculation (min): 27 min  Charges: OT General Charges $OT Visit: 1 Visit OT Treatments $Self Care/Home Management : 23-37 mins  Adrianne BROCKS, OT  Acute Rehabilitation Services Office [phone] Secure chat preferred   Adrianne GORMAN Savers 01/19/2024, 12:18 PM

## 2024-01-19 NOTE — Progress Notes (Signed)
 Progress Note  Patient Name: Chase Combs Date of Encounter: 01/19/2024 [facility] Cardiologist: Alm Clay, MD   Interval Summary   Generally feeling okay.  Did require oxygen with walking.  Still has quite a bit of ventricular ectopy, but no serious arrhythmia.  Denies chest pain.  Vital Signs Vitals:   01/18/24 2028 01/19/24 0438 01/19/24 0819 01/19/24 0827  BP: 111/60 101/78 (!) 142/115   Pulse: 96 (!) 101 (!) 105 94  Resp: 17 18 18 18   Temp: 99 F (37.2 C) 98.6 F (37 C) 98.4 F (36.9 C)   TempSrc: Oral Oral Oral   SpO2: 94% 91% 93%   Weight:      Height:        Intake/Output Summary (Last 24 hours) at 01/19/2024 1017 Last data filed at 01/19/2024 0841 Gross per 24 hour  Intake 1083 ml  Output 1500 ml  Net -417 ml      01/18/2024    5:01 AM 01/15/2024    5:00 AM 01/14/2024    5:00 AM  Last 3 Weights  Weight (lbs) 193 lb 8 oz 212 lb 10.1 oz 212 lb 11.9 oz  Weight (kg) 87.771 kg 96.45 kg 96.5 kg      Telemetry/ECG  NSR with very frequent PVCs/couplets, occasional 3-5 beat NSVT (slow)  - Personally Reviewed  Physical Exam  GEN: No acute distress.   Neck: No JVD Cardiac: ectopy on background of RRR, no murmurs, rubs, or gallops.  Respiratory: Clear to auscultation bilaterally. GI: Soft, nontender, non-distended  MS: No edema  Assessment & Plan  CHF: Reportedly net negative 16.7 liters since admission, weight down 30 lb from this admission peak, but unsure of accuracy of yesterday's weight. No weight today yet. Newly reduced LVEF from previous baseline of roughly 45% down to 25-30% including moderately reduced RV systolic function by echocardiography.  The right heart catheterization on this admission showed mild pulmonary hypertension and PAWP at the upper limit of normal following diuresis. Now on sacubitril -valsartan , metoprolol  succinate, spironolactone  and BP relatively soft.  Recheck LVEF once we have finished titrating heart failure  medications. Also plan SGLT2 inhibitor, but he has some groin irritation from purewick condom cath. Will wait until after DC. Waiting for K and Mg labs today, but plan to start oral loop diuretic. CAD: s/p PCI-DES to the proximal LAD artery this admission 01/17/2024.  Understands the need for uninterrupted dual antiplatelet therapy for at least the next 6 months.  He will have to delay planned knee replacement surgery until the end of next February.  On high-dose statin, on beta-blocker.  Reevaluate LVEF down the road. NSVT:  K low yesterday, no labs yet today. Aggressively correct electrolyte imbalances.  Continue beta-blocker and amiodarone . HLP: Target LDL less than 75, was 49 on this admission.  Chronically low HDL. LP(a) is mildly elevated. Pneumonia/acute human metapneumovirus infection: He had normal wedge pressure on heart catheterization .  The residual hypoxemia cannot be attributed to heart failure and is likely due to infection and probably some degree of atelectasis.  Encourage incentive spirometry and mobilization.  Ready for DC otherwise, from Cardiology point of view.   [facility] will sign off.   The patient is ready for discharge today from a cardiac standpoint. Medication Recommendations:   Aspirin  81 mg daily Brilinta  90 mg twice daily Rosuvastatin  20 mg daily Entresto  49-51 mg twice daily Spironolactone  12.5 mg once daily Furosemide  20 mg once daily Metoprolol  succinate 25 mg daily Stop amlodipine   and valsartan  Other recommendations (labs, testing, etc): Repeat basic metabolic panel as an outpatient at follow-up Follow up as an outpatient: Arrange TOC follow-up in 1-2 weeks For questions or updates, please contact [facility] Please consult [email or web address] for contact info under       Signed, Jerel Balding, MD

## 2024-01-19 NOTE — Progress Notes (Signed)
 Pt's purewick leaked & pt's bed saturated w/ urine. Pt bathed, bed sanitized & full linen change completed. Pt's groin appears irritated & red. Pt educated that replacing purewick could further irritate skin. Urinal offered since pt able to mobilize to EOB & ambulate w/ stand by assistance but pt adamant on having some sort of external urinary device d/t order for BID diuretic. Condom cath placed

## 2024-01-19 NOTE — Care Management Important Message (Signed)
 Important Message  Patient Details  Name: Chase Combs MRN: [id number] Date of Birth: 17-Dec-1945   Important Message Given:        [name] 01/19/2024, 12:19 PM

## 2024-01-21 ENCOUNTER — Encounter: Payer: Self-pay | Admitting: Cardiology

## 2024-01-21 MED ORDER — ONDANSETRON 4 MG PO TBDP: 4.0000 mg | ORAL_TABLET | Freq: Three times a day (TID) | ORAL | 0 refills | Status: DC | PRN

## 2024-01-21 NOTE — Telephone Encounter (Signed)
 I would say give a trial of trying to stay off amiodarone  for the weekend and see how that does.  I cannot think of any other medicines that would make you feel nauseated.  Unfortunately, I am having to learn about the hospital stay from read in the chart.  I did not seem that nausea was an issue while you are in the hospital.  We can provide Zofran  4 mg every 8 hours as needed for nausea.  Alm Clay, MD

## 2024-01-22 ENCOUNTER — Emergency Department (HOSPITAL_COMMUNITY)

## 2024-01-22 ENCOUNTER — Encounter (HOSPITAL_COMMUNITY): Payer: Self-pay

## 2024-01-22 ENCOUNTER — Other Ambulatory Visit: Payer: Self-pay

## 2024-01-22 ENCOUNTER — Inpatient Hospital Stay (HOSPITAL_COMMUNITY)
Admission: EM | Admit: 2024-01-22 | Discharge: 2024-02-25 | DRG: 853 | Disposition: A | Discharge status: Home Health | Attending: Internal Medicine | Admitting: Internal Medicine

## 2024-01-22 DIAGNOSIS — K559 Vascular disorder of intestine, unspecified: Secondary | ICD-10-CM | POA: Diagnosis present

## 2024-01-22 DIAGNOSIS — R570 Cardiogenic shock: Secondary | ICD-10-CM | POA: Diagnosis not present

## 2024-01-22 DIAGNOSIS — I4510 Unspecified right bundle-branch block: Secondary | ICD-10-CM | POA: Diagnosis not present

## 2024-01-22 DIAGNOSIS — I420 Dilated cardiomyopathy: Secondary | ICD-10-CM | POA: Diagnosis not present

## 2024-01-22 DIAGNOSIS — Z7189 Other specified counseling: Secondary | ICD-10-CM | POA: Diagnosis not present

## 2024-01-22 DIAGNOSIS — K921 Melena: Secondary | ICD-10-CM

## 2024-01-22 DIAGNOSIS — N179 Acute kidney failure, unspecified: Secondary | ICD-10-CM | POA: Diagnosis not present

## 2024-01-22 DIAGNOSIS — R339 Retention of urine, unspecified: Secondary | ICD-10-CM | POA: Diagnosis present

## 2024-01-22 DIAGNOSIS — I2102 ST elevation (STEMI) myocardial infarction involving left anterior descending coronary artery: Secondary | ICD-10-CM | POA: Diagnosis not present

## 2024-01-22 DIAGNOSIS — E119 Type 2 diabetes mellitus without complications: Secondary | ICD-10-CM

## 2024-01-22 DIAGNOSIS — R1013 Epigastric pain: Secondary | ICD-10-CM | POA: Diagnosis not present

## 2024-01-22 DIAGNOSIS — Y831 Surgical operation with implant of artificial internal device as the cause of abnormal reaction of the patient, or of later complication, without mention of misadventure at the time of the procedure: Secondary | ICD-10-CM | POA: Diagnosis present

## 2024-01-22 DIAGNOSIS — G4733 Obstructive sleep apnea (adult) (pediatric): Secondary | ICD-10-CM | POA: Diagnosis present

## 2024-01-22 DIAGNOSIS — Z8546 Personal history of malignant neoplasm of prostate: Secondary | ICD-10-CM

## 2024-01-22 DIAGNOSIS — I5020 Unspecified systolic (congestive) heart failure: Secondary | ICD-10-CM | POA: Diagnosis not present

## 2024-01-22 DIAGNOSIS — I255 Ischemic cardiomyopathy: Secondary | ICD-10-CM | POA: Diagnosis not present

## 2024-01-22 DIAGNOSIS — I2101 ST elevation (STEMI) myocardial infarction involving left main coronary artery: Secondary | ICD-10-CM | POA: Diagnosis not present

## 2024-01-22 DIAGNOSIS — Z7902 Long term (current) use of antithrombotics/antiplatelets: Secondary | ICD-10-CM

## 2024-01-22 DIAGNOSIS — E1169 Type 2 diabetes mellitus with other specified complication: Secondary | ICD-10-CM | POA: Diagnosis present

## 2024-01-22 DIAGNOSIS — Z66 Do not resuscitate: Secondary | ICD-10-CM | POA: Diagnosis present

## 2024-01-22 DIAGNOSIS — R140 Abdominal distension (gaseous): Secondary | ICD-10-CM | POA: Diagnosis not present

## 2024-01-22 DIAGNOSIS — E785 Hyperlipidemia, unspecified: Secondary | ICD-10-CM | POA: Insufficient documentation

## 2024-01-22 DIAGNOSIS — D62 Acute posthemorrhagic anemia: Secondary | ICD-10-CM | POA: Diagnosis not present

## 2024-01-22 DIAGNOSIS — I4891 Unspecified atrial fibrillation: Secondary | ICD-10-CM | POA: Diagnosis not present

## 2024-01-22 DIAGNOSIS — D72810 Lymphocytopenia: Secondary | ICD-10-CM | POA: Diagnosis present

## 2024-01-22 DIAGNOSIS — K558 Other vascular disorders of intestine: Secondary | ICD-10-CM | POA: Diagnosis not present

## 2024-01-22 DIAGNOSIS — K55059 Acute (reversible) ischemia of intestine, part and extent unspecified: Secondary | ICD-10-CM | POA: Diagnosis not present

## 2024-01-22 DIAGNOSIS — J40 Bronchitis, not specified as acute or chronic: Secondary | ICD-10-CM | POA: Diagnosis not present

## 2024-01-22 DIAGNOSIS — I340 Nonrheumatic mitral (valve) insufficiency: Secondary | ICD-10-CM | POA: Diagnosis present

## 2024-01-22 DIAGNOSIS — K279 Peptic ulcer, site unspecified, unspecified as acute or chronic, without hemorrhage or perforation: Secondary | ICD-10-CM

## 2024-01-22 DIAGNOSIS — R1084 Generalized abdominal pain: Secondary | ICD-10-CM | POA: Diagnosis not present

## 2024-01-22 DIAGNOSIS — E1165 Type 2 diabetes mellitus with hyperglycemia: Secondary | ICD-10-CM | POA: Diagnosis present

## 2024-01-22 DIAGNOSIS — R579 Shock, unspecified: Secondary | ICD-10-CM | POA: Diagnosis not present

## 2024-01-22 DIAGNOSIS — Z7984 Long term (current) use of oral hypoglycemic drugs: Secondary | ICD-10-CM

## 2024-01-22 DIAGNOSIS — E7849 Other hyperlipidemia: Secondary | ICD-10-CM | POA: Diagnosis present

## 2024-01-22 DIAGNOSIS — Z452 Encounter for adjustment and management of vascular access device: Secondary | ICD-10-CM | POA: Diagnosis not present

## 2024-01-22 DIAGNOSIS — R935 Abnormal findings on diagnostic imaging of other abdominal regions, including retroperitoneum: Secondary | ICD-10-CM | POA: Diagnosis not present

## 2024-01-22 DIAGNOSIS — I252 Old myocardial infarction: Secondary | ICD-10-CM

## 2024-01-22 DIAGNOSIS — K5939 Other megacolon: Secondary | ICD-10-CM | POA: Diagnosis not present

## 2024-01-22 DIAGNOSIS — R918 Other nonspecific abnormal finding of lung field: Secondary | ICD-10-CM | POA: Diagnosis not present

## 2024-01-22 DIAGNOSIS — N200 Calculus of kidney: Secondary | ICD-10-CM | POA: Diagnosis present

## 2024-01-22 DIAGNOSIS — R059 Cough, unspecified: Secondary | ICD-10-CM | POA: Diagnosis not present

## 2024-01-22 DIAGNOSIS — K648 Other hemorrhoids: Secondary | ICD-10-CM | POA: Diagnosis present

## 2024-01-22 DIAGNOSIS — K259 Gastric ulcer, unspecified as acute or chronic, without hemorrhage or perforation: Secondary | ICD-10-CM | POA: Diagnosis present

## 2024-01-22 DIAGNOSIS — E876 Hypokalemia: Secondary | ICD-10-CM | POA: Diagnosis present

## 2024-01-22 DIAGNOSIS — I2109 ST elevation (STEMI) myocardial infarction involving other coronary artery of anterior wall: Secondary | ICD-10-CM | POA: Diagnosis not present

## 2024-01-22 DIAGNOSIS — K567 Ileus, unspecified: Secondary | ICD-10-CM

## 2024-01-22 DIAGNOSIS — I214 Non-ST elevation (NSTEMI) myocardial infarction: Secondary | ICD-10-CM | POA: Diagnosis present

## 2024-01-22 DIAGNOSIS — R571 Hypovolemic shock: Secondary | ICD-10-CM | POA: Diagnosis present

## 2024-01-22 DIAGNOSIS — R739 Hyperglycemia, unspecified: Secondary | ICD-10-CM | POA: Diagnosis not present

## 2024-01-22 DIAGNOSIS — Z515 Encounter for palliative care: Secondary | ICD-10-CM | POA: Diagnosis not present

## 2024-01-22 DIAGNOSIS — Z79899 Other long term (current) drug therapy: Secondary | ICD-10-CM

## 2024-01-22 DIAGNOSIS — K5731 Diverticulosis of large intestine without perforation or abscess with bleeding: Secondary | ICD-10-CM | POA: Diagnosis not present

## 2024-01-22 DIAGNOSIS — N170 Acute kidney failure with tubular necrosis: Secondary | ICD-10-CM | POA: Diagnosis not present

## 2024-01-22 DIAGNOSIS — K429 Umbilical hernia without obstruction or gangrene: Secondary | ICD-10-CM | POA: Diagnosis not present

## 2024-01-22 DIAGNOSIS — K5981 Ogilvie syndrome: Secondary | ICD-10-CM | POA: Diagnosis present

## 2024-01-22 DIAGNOSIS — K6389 Other specified diseases of intestine: Secondary | ICD-10-CM | POA: Diagnosis not present

## 2024-01-22 DIAGNOSIS — Z4682 Encounter for fitting and adjustment of non-vascular catheter: Secondary | ICD-10-CM | POA: Diagnosis not present

## 2024-01-22 DIAGNOSIS — I5022 Chronic systolic (congestive) heart failure: Secondary | ICD-10-CM | POA: Diagnosis not present

## 2024-01-22 DIAGNOSIS — Z7982 Long term (current) use of aspirin: Secondary | ICD-10-CM

## 2024-01-22 DIAGNOSIS — I5023 Acute on chronic systolic (congestive) heart failure: Secondary | ICD-10-CM | POA: Diagnosis not present

## 2024-01-22 DIAGNOSIS — B369 Superficial mycosis, unspecified: Secondary | ICD-10-CM | POA: Diagnosis not present

## 2024-01-22 DIAGNOSIS — Z91040 Latex allergy status: Secondary | ICD-10-CM

## 2024-01-22 DIAGNOSIS — Z96652 Presence of left artificial knee joint: Secondary | ICD-10-CM | POA: Diagnosis present

## 2024-01-22 DIAGNOSIS — K2970 Gastritis, unspecified, without bleeding: Secondary | ICD-10-CM | POA: Diagnosis not present

## 2024-01-22 DIAGNOSIS — E440 Moderate protein-calorie malnutrition: Secondary | ICD-10-CM | POA: Diagnosis not present

## 2024-01-22 DIAGNOSIS — I5021 Acute systolic (congestive) heart failure: Secondary | ICD-10-CM | POA: Diagnosis not present

## 2024-01-22 DIAGNOSIS — Z741 Need for assistance with personal care: Secondary | ICD-10-CM | POA: Diagnosis present

## 2024-01-22 DIAGNOSIS — M21162 Varus deformity, not elsewhere classified, left knee: Secondary | ICD-10-CM | POA: Diagnosis present

## 2024-01-22 DIAGNOSIS — E860 Dehydration: Secondary | ICD-10-CM | POA: Diagnosis present

## 2024-01-22 DIAGNOSIS — J9601 Acute respiratory failure with hypoxia: Secondary | ICD-10-CM | POA: Diagnosis not present

## 2024-01-22 DIAGNOSIS — Z6830 Body mass index (BMI) 30.0-30.9, adult: Secondary | ICD-10-CM | POA: Diagnosis not present

## 2024-01-22 DIAGNOSIS — K269 Duodenal ulcer, unspecified as acute or chronic, without hemorrhage or perforation: Secondary | ICD-10-CM | POA: Diagnosis present

## 2024-01-22 DIAGNOSIS — M24511 Contracture, right shoulder: Secondary | ICD-10-CM | POA: Diagnosis not present

## 2024-01-22 DIAGNOSIS — R0989 Other specified symptoms and signs involving the circulatory and respiratory systems: Secondary | ICD-10-CM | POA: Diagnosis not present

## 2024-01-22 DIAGNOSIS — K922 Gastrointestinal hemorrhage, unspecified: Secondary | ICD-10-CM | POA: Diagnosis present

## 2024-01-22 DIAGNOSIS — Z8616 Personal history of COVID-19: Secondary | ICD-10-CM | POA: Diagnosis not present

## 2024-01-22 DIAGNOSIS — I5082 Biventricular heart failure: Secondary | ICD-10-CM | POA: Diagnosis present

## 2024-01-22 DIAGNOSIS — G4700 Insomnia, unspecified: Secondary | ICD-10-CM | POA: Diagnosis present

## 2024-01-22 DIAGNOSIS — I428 Other cardiomyopathies: Secondary | ICD-10-CM | POA: Diagnosis not present

## 2024-01-22 DIAGNOSIS — I2510 Atherosclerotic heart disease of native coronary artery without angina pectoris: Secondary | ICD-10-CM | POA: Diagnosis not present

## 2024-01-22 DIAGNOSIS — E8721 Acute metabolic acidosis: Secondary | ICD-10-CM | POA: Diagnosis not present

## 2024-01-22 DIAGNOSIS — I4819 Other persistent atrial fibrillation: Secondary | ICD-10-CM | POA: Diagnosis not present

## 2024-01-22 DIAGNOSIS — K3189 Other diseases of stomach and duodenum: Secondary | ICD-10-CM | POA: Diagnosis present

## 2024-01-22 DIAGNOSIS — I071 Rheumatic tricuspid insufficiency: Secondary | ICD-10-CM | POA: Diagnosis present

## 2024-01-22 DIAGNOSIS — I2119 ST elevation (STEMI) myocardial infarction involving other coronary artery of inferior wall: Secondary | ICD-10-CM | POA: Diagnosis present

## 2024-01-22 DIAGNOSIS — R6521 Severe sepsis with septic shock: Secondary | ICD-10-CM | POA: Diagnosis present

## 2024-01-22 DIAGNOSIS — K638219 Small intestinal bacterial overgrowth, unspecified: Secondary | ICD-10-CM | POA: Diagnosis not present

## 2024-01-22 DIAGNOSIS — K626 Ulcer of anus and rectum: Secondary | ICD-10-CM | POA: Diagnosis present

## 2024-01-22 DIAGNOSIS — R5381 Other malaise: Secondary | ICD-10-CM | POA: Diagnosis not present

## 2024-01-22 DIAGNOSIS — K562 Volvulus: Secondary | ICD-10-CM | POA: Diagnosis not present

## 2024-01-22 DIAGNOSIS — L89302 Pressure ulcer of unspecified buttock, stage 2: Secondary | ICD-10-CM | POA: Diagnosis not present

## 2024-01-22 DIAGNOSIS — E8809 Other disorders of plasma-protein metabolism, not elsewhere classified: Secondary | ICD-10-CM | POA: Diagnosis present

## 2024-01-22 DIAGNOSIS — E7800 Pure hypercholesterolemia, unspecified: Secondary | ICD-10-CM | POA: Diagnosis present

## 2024-01-22 DIAGNOSIS — I351 Nonrheumatic aortic (valve) insufficiency: Secondary | ICD-10-CM | POA: Diagnosis not present

## 2024-01-22 DIAGNOSIS — I10 Essential (primary) hypertension: Secondary | ICD-10-CM | POA: Diagnosis not present

## 2024-01-22 DIAGNOSIS — Z801 Family history of malignant neoplasm of trachea, bronchus and lung: Secondary | ICD-10-CM

## 2024-01-22 DIAGNOSIS — A419 Sepsis, unspecified organism: Secondary | ICD-10-CM | POA: Diagnosis not present

## 2024-01-22 DIAGNOSIS — K625 Hemorrhage of anus and rectum: Secondary | ICD-10-CM | POA: Diagnosis not present

## 2024-01-22 DIAGNOSIS — M47816 Spondylosis without myelopathy or radiculopathy, lumbar region: Secondary | ICD-10-CM | POA: Diagnosis present

## 2024-01-22 DIAGNOSIS — K2210 Ulcer of esophagus without bleeding: Secondary | ICD-10-CM | POA: Diagnosis present

## 2024-01-22 DIAGNOSIS — K2100 Gastro-esophageal reflux disease with esophagitis, without bleeding: Secondary | ICD-10-CM | POA: Diagnosis not present

## 2024-01-22 DIAGNOSIS — J90 Pleural effusion, not elsewhere classified: Secondary | ICD-10-CM | POA: Diagnosis not present

## 2024-01-22 DIAGNOSIS — I4892 Unspecified atrial flutter: Secondary | ICD-10-CM | POA: Diagnosis not present

## 2024-01-22 DIAGNOSIS — J9811 Atelectasis: Secondary | ICD-10-CM | POA: Diagnosis not present

## 2024-01-22 DIAGNOSIS — M21161 Varus deformity, not elsewhere classified, right knee: Secondary | ICD-10-CM | POA: Diagnosis present

## 2024-01-22 DIAGNOSIS — Z7901 Long term (current) use of anticoagulants: Secondary | ICD-10-CM

## 2024-01-22 DIAGNOSIS — K5989 Other specified functional intestinal disorders: Secondary | ICD-10-CM | POA: Diagnosis not present

## 2024-01-22 DIAGNOSIS — Z794 Long term (current) use of insulin: Secondary | ICD-10-CM

## 2024-01-22 DIAGNOSIS — Z923 Personal history of irradiation: Secondary | ICD-10-CM

## 2024-01-22 DIAGNOSIS — J690 Pneumonitis due to inhalation of food and vomit: Secondary | ICD-10-CM | POA: Diagnosis not present

## 2024-01-22 DIAGNOSIS — N401 Enlarged prostate with lower urinary tract symptoms: Secondary | ICD-10-CM | POA: Diagnosis present

## 2024-01-22 DIAGNOSIS — E871 Hypo-osmolality and hyponatremia: Secondary | ICD-10-CM | POA: Diagnosis present

## 2024-01-22 DIAGNOSIS — R000 Tachycardia, unspecified: Secondary | ICD-10-CM | POA: Diagnosis not present

## 2024-01-22 DIAGNOSIS — K8689 Other specified diseases of pancreas: Secondary | ICD-10-CM | POA: Diagnosis not present

## 2024-01-22 DIAGNOSIS — M24561 Contracture, right knee: Secondary | ICD-10-CM | POA: Diagnosis not present

## 2024-01-22 DIAGNOSIS — I700 Atherosclerosis of aorta: Secondary | ICD-10-CM | POA: Diagnosis present

## 2024-01-22 DIAGNOSIS — E875 Hyperkalemia: Secondary | ICD-10-CM | POA: Diagnosis present

## 2024-01-22 DIAGNOSIS — Z8701 Personal history of pneumonia (recurrent): Secondary | ICD-10-CM

## 2024-01-22 DIAGNOSIS — I361 Nonrheumatic tricuspid (valve) insufficiency: Secondary | ICD-10-CM | POA: Diagnosis not present

## 2024-01-22 DIAGNOSIS — K5730 Diverticulosis of large intestine without perforation or abscess without bleeding: Secondary | ICD-10-CM | POA: Diagnosis not present

## 2024-01-22 DIAGNOSIS — K838 Other specified diseases of biliary tract: Secondary | ICD-10-CM | POA: Diagnosis not present

## 2024-01-22 DIAGNOSIS — R932 Abnormal findings on diagnostic imaging of liver and biliary tract: Secondary | ICD-10-CM | POA: Diagnosis not present

## 2024-01-22 DIAGNOSIS — Z955 Presence of coronary angioplasty implant and graft: Secondary | ICD-10-CM | POA: Diagnosis not present

## 2024-01-22 DIAGNOSIS — K560 Paralytic ileus: Secondary | ICD-10-CM | POA: Diagnosis present

## 2024-01-22 DIAGNOSIS — L8990 Pressure ulcer of unspecified site, unspecified stage: Secondary | ICD-10-CM | POA: Diagnosis present

## 2024-01-22 DIAGNOSIS — D75839 Thrombocytosis, unspecified: Secondary | ICD-10-CM | POA: Diagnosis not present

## 2024-01-22 DIAGNOSIS — Z9861 Coronary angioplasty status: Secondary | ICD-10-CM

## 2024-01-22 DIAGNOSIS — E46 Unspecified protein-calorie malnutrition: Secondary | ICD-10-CM | POA: Diagnosis not present

## 2024-01-22 DIAGNOSIS — I951 Orthostatic hypotension: Secondary | ICD-10-CM | POA: Diagnosis present

## 2024-01-22 DIAGNOSIS — T82867A Thrombosis of cardiac prosthetic devices, implants and grafts, initial encounter: Secondary | ICD-10-CM | POA: Diagnosis present

## 2024-01-22 DIAGNOSIS — Z5320 Procedure and treatment not carried out because of patient's decision for unspecified reasons: Secondary | ICD-10-CM | POA: Diagnosis present

## 2024-01-22 DIAGNOSIS — I110 Hypertensive heart disease with heart failure: Secondary | ICD-10-CM | POA: Diagnosis present

## 2024-01-22 DIAGNOSIS — M25561 Pain in right knee: Secondary | ICD-10-CM | POA: Diagnosis present

## 2024-01-22 DIAGNOSIS — E861 Hypovolemia: Secondary | ICD-10-CM | POA: Diagnosis present

## 2024-01-22 DIAGNOSIS — R197 Diarrhea, unspecified: Secondary | ICD-10-CM | POA: Diagnosis not present

## 2024-01-22 LAB — TROPONIN I (HIGH SENSITIVITY)
Troponin I (High Sensitivity): 126 ng/L (ref <18)
Troponin I (High Sensitivity): 108 ng/L (ref <18)

## 2024-01-22 LAB — I-STAT CG4 LACTIC ACID, ED
Lactic Acid, Venous: 3.4 mmol/L (ref 0.5–1.9)
Lactic Acid, Venous: 1.9 mmol/L (ref 0.5–1.9)

## 2024-01-22 LAB — CBC WITH DIFFERENTIAL/PLATELET
Abs Immature Granulocytes: 0.1 K/uL — ABNORMAL HIGH (ref 0.00–0.07)
Basophils Absolute: 0 K/uL (ref 0.0–0.1)
Basophils Relative: 0 %
Eosinophils Absolute: 0 K/uL (ref 0.0–0.5)
Eosinophils Relative: 0 %
HCT: 43.2 % (ref 39.0–52.0)
Hemoglobin: 13.9 g/dL (ref 13.0–17.0)
Lymphocytes Relative: 12 %
Lymphs Abs: 0.3 K/uL — ABNORMAL LOW (ref 0.7–4.0)
MCH: 28.5 pg (ref 26.0–34.0)
MCHC: 32.2 g/dL (ref 30.0–36.0)
MCV: 88.5 fL (ref 80.0–100.0)
Metamyelocytes Relative: 4 %
Monocytes Absolute: 0.3 K/uL (ref 0.1–1.0)
Monocytes Relative: 12 %
Myelocytes: 1 %
Neutro Abs: 1.7 K/uL (ref 1.7–7.7)
Neutrophils Relative %: 71 %
Platelets: 489 K/uL — ABNORMAL HIGH (ref 150–400)
RBC: 4.88 MIL/uL (ref 4.22–5.81)
RDW: 16.9 % — ABNORMAL HIGH (ref 11.5–15.5)
WBC: 2.4 K/uL — ABNORMAL LOW (ref 4.0–10.5)
nRBC: 0 % (ref 0.0–0.2)

## 2024-01-22 LAB — URINALYSIS, W/ REFLEX TO CULTURE (INFECTION SUSPECTED)
Bilirubin Urine: NEGATIVE
Glucose, UA: NEGATIVE mg/dL
Hgb urine dipstick: NEGATIVE
Ketones, ur: NEGATIVE mg/dL
Leukocytes,Ua: NEGATIVE
Nitrite: NEGATIVE
Protein, ur: NEGATIVE mg/dL
Specific Gravity, Urine: 1.02 (ref 1.005–1.030)
pH: 5 (ref 5.0–8.0)

## 2024-01-22 LAB — MAGNESIUM: Magnesium: 2.1 mg/dL (ref 1.7–2.4)

## 2024-01-22 LAB — GLUCOSE, CAPILLARY
Glucose-Capillary: 168 mg/dL — ABNORMAL HIGH (ref 70–99)
Glucose-Capillary: 187 mg/dL — ABNORMAL HIGH (ref 70–99)
Glucose-Capillary: 90 mg/dL (ref 70–99)

## 2024-01-22 LAB — PROCALCITONIN: Procalcitonin: 1.23 ng/mL

## 2024-01-22 LAB — COMPREHENSIVE METABOLIC PANEL WITH GFR
ALT: 31 U/L (ref 0–44)
AST: 29 U/L (ref 15–41)
Albumin: 3 g/dL — ABNORMAL LOW (ref 3.5–5.0)
Alkaline Phosphatase: 64 U/L (ref 38–126)
Anion gap: 20 — ABNORMAL HIGH (ref 5–15)
BUN: 64 mg/dL — ABNORMAL HIGH (ref 8–23)
CO2: 31 mmol/L (ref 22–32)
Calcium: 8.6 mg/dL — ABNORMAL LOW (ref 8.9–10.3)
Chloride: 81 mmol/L — ABNORMAL LOW (ref 98–111)
Creatinine, Ser: 2.55 mg/dL — ABNORMAL HIGH (ref 0.61–1.24)
GFR, Estimated: 25 mL/min — ABNORMAL LOW (ref >60)
Glucose, Bld: 197 mg/dL — ABNORMAL HIGH (ref 70–99)
Potassium: 4.5 mmol/L (ref 3.5–5.1)
Sodium: 132 mmol/L — ABNORMAL LOW (ref 135–145)
Total Bilirubin: 1.4 mg/dL — ABNORMAL HIGH (ref 0.0–1.2)
Total Protein: 6.2 g/dL — ABNORMAL LOW (ref 6.5–8.1)

## 2024-01-22 LAB — I-STAT CHEM 8, ED
BUN: 65 mg/dL — ABNORMAL HIGH (ref 8–23)
Calcium, Ion: 0.96 mmol/L — ABNORMAL LOW (ref 1.15–1.40)
Chloride: 87 mmol/L — ABNORMAL LOW (ref 98–111)
Creatinine, Ser: 2.5 mg/dL — ABNORMAL HIGH (ref 0.61–1.24)
Glucose, Bld: 199 mg/dL — ABNORMAL HIGH (ref 70–99)
HCT: 45 % (ref 39.0–52.0)
Hemoglobin: 15.3 g/dL (ref 13.0–17.0)
Potassium: 4.3 mmol/L (ref 3.5–5.1)
Sodium: 131 mmol/L — ABNORMAL LOW (ref 135–145)
TCO2: 34 mmol/L — ABNORMAL HIGH (ref 22–32)

## 2024-01-22 LAB — C DIFFICILE QUICK SCREEN W PCR REFLEX
C Diff antigen: NEGATIVE
C Diff interpretation: NOT DETECTED
C Diff toxin: NEGATIVE

## 2024-01-22 LAB — MRSA NEXT GEN BY PCR, NASAL: MRSA by PCR Next Gen: NOT DETECTED

## 2024-01-22 LAB — BRAIN NATRIURETIC PEPTIDE: B Natriuretic Peptide: 220.4 pg/mL — ABNORMAL HIGH (ref 0.0–100.0)

## 2024-01-22 MED ORDER — SODIUM CHLORIDE 0.9 % IV SOLN
0.7500 ug/kg/min | INTRAVENOUS | Status: DC
  Administered 2024-01-22 – 2024-02-01 (×18): 0.75 ug/kg/min via INTRAVENOUS
  Filled 2024-01-22 (×19): qty 50

## 2024-01-22 MED ORDER — LACTATED RINGERS IV BOLUS
1000.0000 mL | Freq: Once | INTRAVENOUS | Status: AC
  Administered 2024-01-22: 1000 mL via INTRAVENOUS

## 2024-01-22 MED ORDER — VANCOMYCIN HCL 1750 MG/350ML IV SOLN
1750.0000 mg | Freq: Once | INTRAVENOUS | Status: DC
  Administered 2024-01-22: 1750 mg via INTRAVENOUS
  Filled 2024-01-22: qty 350

## 2024-01-22 MED ORDER — SODIUM CHLORIDE 0.9 % IV SOLN
250.0000 mL | INTRAVENOUS | Status: DC
  Administered 2024-01-22: 250 mL via INTRAVENOUS

## 2024-01-22 MED ORDER — METRONIDAZOLE 500 MG/100ML IV SOLN: 500.0000 mg | Freq: Once | INTRAVENOUS | Status: DC

## 2024-01-22 MED ORDER — PIPERACILLIN-TAZOBACTAM 3.375 G IVPB
3.3750 g | Freq: Three times a day (TID) | INTRAVENOUS | Status: AC
  Administered 2024-01-22 – 2024-01-28 (×19): 3.375 g via INTRAVENOUS
  Filled 2024-01-22 (×20): qty 50

## 2024-01-22 MED ORDER — INSULIN ASPART 100 UNIT/ML IJ SOLN
0.0000 [IU] | INTRAMUSCULAR | Status: DC
  Administered 2024-01-22 – 2024-01-23 (×2): 2 [IU] via SUBCUTANEOUS
  Administered 2024-01-23: 1 [IU] via SUBCUTANEOUS
  Administered 2024-01-24: 2 [IU] via SUBCUTANEOUS
  Administered 2024-01-24 – 2024-01-26 (×2): 1 [IU] via SUBCUTANEOUS
  Administered 2024-01-27: 2 [IU] via SUBCUTANEOUS
  Administered 2024-01-27: 3 [IU] via SUBCUTANEOUS
  Administered 2024-01-27 (×2): 1 [IU] via SUBCUTANEOUS
  Administered 2024-01-27: 3 [IU] via SUBCUTANEOUS
  Administered 2024-01-28: 1 [IU] via SUBCUTANEOUS
  Administered 2024-01-28: 2 [IU] via SUBCUTANEOUS
  Administered 2024-01-28: 1 [IU] via SUBCUTANEOUS

## 2024-01-22 MED ORDER — SODIUM CHLORIDE 0.9 % IV BOLUS
500.0000 mL | Freq: Once | INTRAVENOUS | Status: AC
  Administered 2024-01-22: 500 mL via INTRAVENOUS

## 2024-01-22 MED ORDER — SODIUM CHLORIDE 0.9 % IV SOLN: 100.0000 mg | Freq: Once | INTRAVENOUS | Status: DC

## 2024-01-22 MED ORDER — SODIUM CHLORIDE 0.9 % IV SOLN: 500.0000 mg | Freq: Once | INTRAVENOUS | Status: DC

## 2024-01-22 MED ORDER — METRONIDAZOLE 500 MG/100ML IV SOLN
500.0000 mg | Freq: Three times a day (TID) | INTRAVENOUS | Status: DC
  Administered 2024-01-22 – 2024-01-23 (×2): 500 mg via INTRAVENOUS
  Filled 2024-01-22 (×3): qty 100

## 2024-01-22 MED ORDER — POLYETHYLENE GLYCOL 3350 17 G PO PACK: 17.0000 g | PACK | Freq: Every day | ORAL | Status: DC | PRN

## 2024-01-22 MED ORDER — NOREPINEPHRINE 4 MG/250ML-% IV SOLN
0.0000 ug/min | INTRAVENOUS | Status: DC
  Administered 2024-01-22: 2 ug/min via INTRAVENOUS
  Administered 2024-01-23: 6 ug/min via INTRAVENOUS
  Administered 2024-01-23: 9 ug/min via INTRAVENOUS
  Administered 2024-01-24: 8 ug/min via INTRAVENOUS
  Filled 2024-01-22 (×4): qty 250

## 2024-01-22 MED ORDER — CHLORHEXIDINE GLUCONATE CLOTH 2 % EX PADS
6.0000 | MEDICATED_PAD | Freq: Every day | CUTANEOUS | Status: DC
  Administered 2024-01-22 – 2024-01-30 (×9): 6 via TOPICAL

## 2024-01-22 MED ORDER — SODIUM CHLORIDE 0.9 % IV SOLN
2.0000 g | Freq: Once | INTRAVENOUS | Status: AC
  Administered 2024-01-22: 2 g via INTRAVENOUS
  Filled 2024-01-22: qty 12.5

## 2024-01-22 MED ORDER — DOCUSATE SODIUM 100 MG PO CAPS: 100.0000 mg | ORAL_CAPSULE | Freq: Two times a day (BID) | ORAL | Status: DC | PRN

## 2024-01-22 MED ORDER — ACETAMINOPHEN 500 MG PO TABS
1000.0000 mg | ORAL_TABLET | Freq: Once | ORAL | Status: AC
  Administered 2024-01-22: 1000 mg via ORAL
  Filled 2024-01-22: qty 2

## 2024-01-22 MED ORDER — POTASSIUM CHLORIDE 10 MEQ/100ML IV SOLN
10.0000 meq | INTRAVENOUS | Status: AC
  Administered 2024-01-22 – 2024-01-23 (×10): 10 meq via INTRAVENOUS
  Filled 2024-01-22 (×10): qty 100

## 2024-01-22 NOTE — Progress Notes (Signed)
 PHARMACY - Miscellaneous CONSULT NOTE  Pharmacy Consult for Cangrelor  Indication: recent stent 01/17/24 and unable to take po ticagrelor   Allergies  Allergen Reactions   Codeine Nausea And Vomiting   Latex Rash    Oral blisters when used at dentist    Patient Measurements:    Vital Signs: Temp: 99.8 F (37.7 C) (08/30 1703) Temp Source: Oral (08/30 1703) BP: 111/79 (08/30 1600) Pulse Rate: 44 (08/30 1600)  Labs: Recent Labs    01/22/24 1243 01/22/24 1259 01/22/24 1443  HGB 13.9 15.3  --   HCT 43.2 45.0  --   PLT 489*  --   --   CREATININE 2.55* 2.50*  --   TROPONINIHS 126*  --  108*    Estimated Creatinine Clearance: 27.6 mL/min (A) (by C-G formula based on SCr of 2.5 mg/dL (H)).   Medical History: Past Medical History:  Diagnosis Date   Abdominal hernia    per pt   Arthritis    hands   Benign localized prostatic hyperplasia with lower urinary tract symptoms (LUTS)    CAD (coronary artery disease) 05/2004   cardiologist--- dr anner;  positive stress test , had outpt cardiac cath 05-27-2004 stenosis RI;  06-18-2004 cath w/ PCI and BMS x1 to pRI;   STEMI 02-16-2009  s/p cath w/ PCI thrombectomy for total occluded RCA , BMS x1 to pRCA;  nuclear stress test 03-15-2012 low risk no ischemia but sig inferior-inferolateral infarct , ef 51%   Dyslipidemia    Essential hypertension    Heart murmur    History of COVID-19 05/2020   per pt mild symptoms that resolved   History of ST elevation myocardial infarction (STEMI) 02/16/2009   inferior STEMI   cath w/ pci w/ BMS to pRCA   History of urinary retention    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   urologist--- dr borden/ radiation oncology-- dr patrcia;  dx 12/ 2022,  Gleason 4+5, PSA 24.6, volume 94.6cc;  plan to start IMRT   OSA (obstructive sleep apnea)    per pt dx approx 2008, no cpap intolerant   PONV (postoperative nausea and vomiting)    Pre-diabetes    S/p bare metal coronary artery stent    01/  2006  x1 BMS to pRI (CoStar study stent);  and 09/ 2010  x1 BMS to pRCA    Medications:  Medications Prior to Admission  Medication Sig Dispense Refill Last Dose/Taking   acetaminophen  (TYLENOL ) 500 MG tablet Take 500 mg by mouth every 6 (six) hours as needed. (Patient not taking: Reported on 01/08/2024)      amiodarone  (PACERONE ) 200 MG tablet Take 1 tablet (200 mg total) by mouth daily. 30 tablet 0    aspirin  EC 81 MG tablet Take 81 mg by mouth 2 (two) times daily.      calcium  carbonate (OSCAL) 1500 (600 Ca) MG TABS tablet Take 600 mg of elemental calcium  by mouth every evening.      clotrimazole-betamethasone (LOTRISONE) cream Apply 1 Application topically 2 (two) times daily.      cyanocobalamin  (VITAMIN B12) 500 MCG tablet Take 500 mcg by mouth daily.      furosemide  (LASIX ) 20 MG tablet Take 20 mg by mouth daily.      furosemide  (LASIX ) 20 MG tablet Take 1 tablet (20 mg total) by mouth daily. 90 tablet 3    metoprolol  succinate (TOPROL -XL) 25 MG 24 hr tablet Take 1 tablet (25 mg total) by mouth daily. 30 tablet 0  Multiple Vitamin (MULTIVITAMIN WITH MINERALS) TABS tablet Take 1 tablet by mouth in the morning.      nitroGLYCERIN  (NITROSTAT ) 0.4 MG SL tablet Place 1 tablet (0.4 mg total) under the tongue every 5 (five) minutes as needed for chest pain. 25 tablet 3    ondansetron  (ZOFRAN -ODT) 4 MG disintegrating tablet Take 1 tablet (4 mg total) by mouth every 8 (eight) hours as needed for nausea or vomiting. 20 tablet 0    Probiotic Product (CULTURELLE PROBIOTICS) CHEW Chew 1 tablet by mouth daily.      rosuvastatin  (CRESTOR ) 20 MG tablet TAKE 1 TABLET(20 MG) BY MOUTH DAILY 30 tablet 11    sacubitril -valsartan  (ENTRESTO ) 49-51 MG Take 1 tablet by mouth 2 (two) times daily. 60 tablet 1    spironolactone  (ALDACTONE ) 25 MG tablet Take 0.5 tablets (12.5 mg total) by mouth daily. 30 tablet 1    tamsulosin  (FLOMAX ) 0.4 MG CAPS capsule Take 1 capsule (0.4 mg total) by mouth daily after supper.  30 capsule 5    ticagrelor  (BRILINTA ) 90 MG TABS tablet Take 1 tablet (90 mg total) by mouth 2 (two) times daily. 60 tablet 1    traMADol  (ULTRAM ) 50 MG tablet Take 1 tablet (50 mg total) by mouth every 6 (six) hours as needed for moderate pain (pain score 4-6) or severe pain (pain score 7-10). (Patient not taking: Reported on 01/08/2024) 20 tablet 0     Assessment: 78 y.o. 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 Pt with recent PCI to proximal LAD and pt sent home with ticagrelor . Pt with N/V and concern if pt has been absorbing ticagrelor . Plan to use IV cangrelor  until pt can take ticagrelor .  Goal of Therapy:  Prevention of stent thrombosis   Plan:  Cangrelor  0.75 mcg/kg/min Will f/u CBC Plan back to ticagrelor  when able  Vito Ralph, PharmD, BCPS Please see amion for complete clinical pharmacist phone list 01/22/2024,5:37 PM

## 2024-01-22 NOTE — Consult Note (Signed)
 Reason for Consult:?pneumotosis Referring Physician: Dr. claudene Sherida Chase Combs is an 78 y.o. male.  HPI: 33M with n/v/d with recent LAD Stent and on kangrelor this was 8/27.  Pt with CT scan for distention and show concern for ischemic colitis.  Pt states he has had some diarrhea that he states was not bloody since his stent.  He has had some nausea, but has held some food down.  Last diarrhea episode was last night.  Past Medical History:  Diagnosis Date   Abdominal hernia    per pt   Arthritis    hands   Benign localized prostatic hyperplasia with lower urinary tract symptoms (LUTS)    CAD (coronary artery disease) 05/2004   cardiologist--- dr anner;  positive stress test , had outpt cardiac cath 05-27-2004 stenosis RI;  06-18-2004 cath w/ PCI and BMS x1 to pRI;   STEMI 02-16-2009  s/p cath w/ PCI thrombectomy for total occluded RCA , BMS x1 to pRCA;  nuclear stress test 03-15-2012 low risk no ischemia but sig inferior-inferolateral infarct , ef 51%   Dyslipidemia    Essential hypertension    Heart murmur    History of COVID-19 05/2020   per pt mild symptoms that resolved   History of ST elevation myocardial infarction (STEMI) 02/16/2009   inferior STEMI   cath w/ pci w/ BMS to pRCA   History of urinary retention    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[facility]) 04/2021   urologist--- dr borden/ radiation oncology-- dr patrcia;  dx 12/ 2022,  Gleason 4+5, PSA 24.6, volume 94.6cc;  plan to start IMRT   OSA (obstructive sleep apnea)    per pt dx approx 2008, no cpap intolerant   PONV (postoperative nausea and vomiting)    Pre-diabetes    S/p bare metal coronary artery stent    01/ 2006  x1 BMS to pRI (CoStar study stent);  and 09/ 2010  x1 BMS to [facility]    Past Surgical History:  Procedure Laterality Date   CARDIAC CATHETERIZATION  05/27/2004   outpatient by dr burnard [facility]) for positive stress test;  80% stensis pRI   CORONARY ANGIOPLASTY WITH STENT PLACEMENT   06/18/2004   @[facility]  by Dr Lavon;  PCI w/ BMS to pRI  (CoStar study stent - 2.5 x 16)   CORONARY ANGIOPLASTY WITH STENT PLACEMENT  02/16/2009   @[facility]  by dr verlin;   Inferior STEMI:  PCI w/ thrombectomy total occluded RCA, BMS x1 to pRCA (Vision BMS 3.5 x 23 -> 4.0 mm), residual 40-50% pLAD, ef 45-50%   CORONARY PRESSURE/FFR STUDY N/A 01/17/2024   Procedure: CORONARY PRESSURE/FFR STUDY;  Surgeon: Mady Bruckner, MD;  Location: [facility];  Service: Cardiovascular;  Laterality: N/A;   CORONARY STENT INTERVENTION N/A 01/17/2024   Procedure: CORONARY STENT INTERVENTION;  Surgeon: Mady Bruckner, MD;  Location: [facility];  Service: Cardiovascular;  Laterality: N/A;   CORONARY ULTRASOUND/IVUS N/A 01/17/2024   Procedure: Coronary Ultrasound/IVUS;  Surgeon: Mady Bruckner, MD;  Location: [facility];  Service: Cardiovascular;  Laterality: N/A;   FLEXIBLE SIGMOIDOSCOPY N/A 10/20/2023   Procedure: KINGSTON SIDE;  Surgeon: Legrand Victory LITTIE DOUGLAS, MD;  Location: [facility];  Service: Gastroenterology;  Laterality: N/A;   GOLD SEED IMPLANT N/A 09/19/2021   Procedure: GOLD SEED IMPLANT;  Surgeon: Cam Morene ORN, MD;  Location: [facility];  Service: Urology;  Laterality: N/A;  ONLY NEEDS 30 MIN FOR ALL   INGUINAL HERNIA REPAIR Right  09/02/1999   @[facility] ;   recurrent repair right inguinal hernia;   previous repair approx 1990s   INGUINAL HERNIA REPAIR Left    1990s   KNEE ARTHROSCOPY W/ MENISCAL REPAIR Right 12/18/2005   @[facility]    LUMBAR DISC SURGERY  1982   L5--S1   RIGHT/LEFT HEART CATH AND CORONARY ANGIOGRAPHY N/A 01/17/2024   Procedure: RIGHT/LEFT HEART CATH AND CORONARY ANGIOGRAPHY;  Surgeon: Mady Bruckner, MD;  Location: [facility];  Service: Cardiovascular;  Laterality: N/A;   SPACE OAR INSTILLATION N/A 09/19/2021   Procedure: SPACE OAR INSTILLATION;  Surgeon: Cam Morene ORN, MD;  Location: [facility];  Service: Urology;   Laterality: N/A;   TOTAL KNEE ARTHROPLASTY Left 10/01/2023   Procedure: ARTHROPLASTY, KNEE, TOTAL;  Surgeon: Kay Kemps, MD;  Location: [facility];  Service: Orthopedics;  Laterality: Left;    Family History  Problem Relation Age of Onset   Lung cancer Mother    Colon cancer Neg Hx    Esophageal cancer Neg Hx     Social History:  reports that he has never smoked. He has never used smokeless tobacco. He reports that he does not drink alcohol and does not use drugs.  Allergies:  Allergies  Allergen Reactions   Codeine Nausea And Vomiting   Latex Rash    Oral blisters when used at dentist    Medications: I have reviewed the patient's current medications.  Results for orders placed or performed during the hospital encounter of 01/22/24 (from the past 48 hours)  Comprehensive metabolic panel     Status: Abnormal   Collection Time: 01/22/24 12:43 PM  Result Value Ref Range   Sodium 132 (L) 135 - 145 mmol/L   Potassium 4.5 3.5 - 5.1 mmol/L    Comment: HEMOLYSIS AT THIS LEVEL MAY AFFECT RESULT   Chloride 81 (L) 98 - 111 mmol/L   CO2 31 22 - 32 mmol/L   Glucose, Bld 197 (H) 70 - 99 mg/dL    Comment: Glucose reference range applies only to samples taken after fasting for at least 8 hours.   BUN 64 (H) 8 - 23 mg/dL   Creatinine, Ser 7.44 (H) 0.61 - 1.24 mg/dL   Calcium  8.6 (L) 8.9 - 10.3 mg/dL   Total Protein 6.2 (L) 6.5 - 8.1 g/dL   Albumin 3.0 (L) 3.5 - 5.0 g/dL   AST 29 15 - 41 U/L    Comment: HEMOLYSIS AT THIS LEVEL MAY AFFECT RESULT   ALT 31 0 - 44 U/L    Comment: HEMOLYSIS AT THIS LEVEL MAY AFFECT RESULT   Alkaline Phosphatase 64 38 - 126 U/L   Total Bilirubin 1.4 (H) 0.0 - 1.2 mg/dL    Comment: HEMOLYSIS AT THIS LEVEL MAY AFFECT RESULT   GFR, Estimated 25 (L) >60 mL/min    Comment: (NOTE) Calculated using the CKD-EPI Creatinine Equation (2021)    Anion gap 20 (H) 5 - 15    Comment: Performed at [facility], [address] [place], [place] [address]  CBC with  Differential     Status: Abnormal   Collection Time: 01/22/24 12:43 PM  Result Value Ref Range   WBC 2.4 (L) 4.0 - 10.5 K/uL   RBC 4.88 4.22 - 5.81 MIL/uL   Hemoglobin 13.9 13.0 - 17.0 g/dL   HCT 56.7 60.9 - 47.9 %   MCV 88.5 80.0 - 100.0 fL   MCH 28.5 26.0 - 34.0 pg   MCHC 32.2 30.0 - 36.0 g/dL  RDW 16.9 (H) 11.5 - 15.5 %   Platelets 489 (H) 150 - 400 K/uL   nRBC 0.0 0.0 - 0.2 %   Neutrophils Relative % 71 %   Neutro Abs 1.7 1.7 - 7.7 K/uL   Lymphocytes Relative 12 %   Lymphs Abs 0.3 (L) 0.7 - 4.0 K/uL   Monocytes Relative 12 %   Monocytes Absolute 0.3 0.1 - 1.0 K/uL   Eosinophils Relative 0 %   Eosinophils Absolute 0.0 0.0 - 0.5 K/uL   Basophils Relative 0 %   Basophils Absolute 0.0 0.0 - 0.1 K/uL   WBC Morphology See Note     Comment:  Mild Left Shift. 1 to 5% Metas, occ myelo   RBC Morphology MORPHOLOGY UNREMARKABLE     Comment:  Morphology unremarkable   Smear Review See Note     Comment:  Normal Platelet Morphology   Metamyelocytes Relative 4 %   Myelocytes 1 %   Abs Immature Granulocytes 0.10 (H) 0.00 - 0.07 K/uL    Comment: Performed at [facility], [address] [place], [place] [address]  Brain natriuretic peptide     Status: Abnormal   Collection Time: 01/22/24 12:43 PM  Result Value Ref Range   B Natriuretic Peptide 220.4 (H) 0.0 - 100.0 pg/mL    Comment: Performed at [facility], [address] [place], [place] [address]  Troponin I (High Sensitivity)     Status: Abnormal   Collection Time: 01/22/24 12:43 PM  Result Value Ref Range   Troponin I (High Sensitivity) 126 (HH) <18 ng/L    Comment: CRITICAL RESULT CALLED TO, READ BACK BY AND VERIFIED WITH APPLEGATE SARA RN @1526  01/22/2024 [email or web address].P (NOTE) Elevated high sensitivity troponin I (hsTnI) values and significant  changes across serial measurements may suggest ACS but many other  chronic and acute conditions are known to elevate hsTnI results.  Refer to the Links section for chest  pain algorithms and additional  guidance. Performed at [facility], [address], [place], [place] [address]   Procalcitonin     Status: None   Collection Time: 01/22/24 12:43 PM  Result Value Ref Range   Procalcitonin 1.23 ng/mL    Comment:        Interpretation: PCT > 0.5 ng/mL and <= 2 ng/mL: Systemic infection (sepsis) is possible, but other conditions are known to elevate PCT as well. (NOTE)       Sepsis PCT Algorithm           Lower Respiratory Tract                                      Infection PCT Algorithm    ----------------------------     ----------------------------         PCT < 0.25 ng/mL                PCT < 0.10 ng/mL          Strongly encourage             Strongly discourage   discontinuation of antibiotics    initiation of antibiotics    ----------------------------     -----------------------------       PCT 0.25 - 0.50 ng/mL            PCT 0.10 - 0.25 ng/mL  OR       >80% decrease in PCT            Discourage initiation of                                            antibiotics      Encourage discontinuation           of antibiotics    ----------------------------     -----------------------------         PCT >= 0.50 ng/mL              PCT 0.26 - 0.50 ng/mL                AND       <80% decrease in PCT             Encourage initiation of                                             antibiotics       Encourage continuation           of antibiotics    ----------------------------     -----------------------------        PCT >= 0.50 ng/mL                  PCT > 0.50 ng/mL               AND         increase in PCT                  Strongly encourage                                      initiation of antibiotics    Strongly encourage escalation           of antibiotics                                     -----------------------------                                           PCT <= 0.25 ng/mL                                                  OR                                        > 80% decrease in PCT                                      Discontinue / Do not initiate  antibiotics  Performed at [facility], [address], [place], [place] [address]   I-stat chem 8, ED     Status: Abnormal   Collection Time: 01/22/24 12:59 PM  Result Value Ref Range   Sodium 131 (L) 135 - 145 mmol/L   Potassium 4.3 3.5 - 5.1 mmol/L   Chloride 87 (L) 98 - 111 mmol/L   BUN 65 (H) 8 - 23 mg/dL   Creatinine, Ser 7.49 (H) 0.61 - 1.24 mg/dL   Glucose, Bld 800 (H) 70 - 99 mg/dL    Comment: Glucose reference range applies only to samples taken after fasting for at least 8 hours.   Calcium , Ion 0.96 (L) 1.15 - 1.40 mmol/L   TCO2 34 (H) 22 - 32 mmol/L   Hemoglobin 15.3 13.0 - 17.0 g/dL   HCT 54.9 60.9 - 47.9 %  I-Stat Lactic Acid     Status: Abnormal   Collection Time: 01/22/24  1:00 PM  Result Value Ref Range   Lactic Acid, Venous 3.4 (HH) 0.5 - 1.9 mmol/L   Comment NOTIFIED PHYSICIAN   C Difficile Quick Screen w PCR reflex     Status: None   Collection Time: 01/22/24  1:10 PM   Specimen: STOOL  Result Value Ref Range   C Diff antigen NEGATIVE NEGATIVE   C Diff toxin NEGATIVE NEGATIVE   C Diff interpretation No C. difficile detected.     Comment: Performed at [facility], [address] [place], [place] [address]  MRSA Next Gen by PCR, Nasal     Status: None   Collection Time: 01/22/24  1:10 PM   Specimen: Nasal Mucosa; Nasal Swab  Result Value Ref Range   MRSA by PCR Next Gen NOT DETECTED NOT DETECTED    Comment: (NOTE) The GeneXpert MRSA Assay (FDA approved for NASAL specimens only), is one component of a comprehensive MRSA colonization surveillance program. It is not intended to diagnose MRSA infection nor to guide or monitor treatment for MRSA infections. Test performance is not FDA approved in patients less than 69 years old. Performed at [facility],  [address], [place], [place] [address]   Troponin I (High Sensitivity)     Status: Abnormal   Collection Time: 01/22/24  2:43 PM  Result Value Ref Range   Troponin I (High Sensitivity) 108 (HH) <18 ng/L    Comment: CRITICAL RESULT CALLED TO, READ BACK BY AND VERIFIED WITH M,HYLTON RN @1619  01/22/24 E,BENTON (NOTE) Elevated high sensitivity troponin I (hsTnI) values and significant  changes across serial measurements may suggest ACS but many other  chronic and acute conditions are known to elevate hsTnI results.  Refer to the Links section for chest pain algorithms and additional  guidance. Performed at [facility], [address], [place], [place] [address]   I-Stat Lactic Acid     Status: None   Collection Time: 01/22/24  3:00 PM  Result Value Ref Range   Lactic Acid, Venous 1.9 0.5 - 1.9 mmol/L  Glucose, capillary     Status: Abnormal   Collection Time: 01/22/24  4:43 PM  Result Value Ref Range   Glucose-Capillary 187 (H) 70 - 99 mg/dL    Comment: Glucose reference range applies only to samples taken after fasting for at least 8 hours.  Urinalysis, w/ Reflex to Culture (Infection Suspected) -Urine, Clean Catch     Status: Abnormal   Collection Time: 01/22/24  5:00 PM  Result Value Ref Range  Specimen Source URINE, CLEAN CATCH    Color, Urine AMBER (A) YELLOW    Comment: BIOCHEMICALS MAY BE AFFECTED BY COLOR   APPearance HAZY (A) CLEAR   Specific Gravity, Urine 1.020 1.005 - 1.030   pH 5.0 5.0 - 8.0   Glucose, UA NEGATIVE NEGATIVE mg/dL   Hgb urine dipstick NEGATIVE NEGATIVE   Bilirubin Urine NEGATIVE NEGATIVE   Ketones, ur NEGATIVE NEGATIVE mg/dL   Protein, ur NEGATIVE NEGATIVE mg/dL   Nitrite NEGATIVE NEGATIVE   Leukocytes,Ua NEGATIVE NEGATIVE   RBC / HPF 0-5 0 - 5 RBC/hpf   WBC, UA 0-5 0 - 5 WBC/hpf    Comment:        Reflex urine culture not performed if WBC <=10, OR if Squamous epithelial cells >5. If Squamous epithelial cells >5 suggest recollection.     Bacteria, UA RARE (A) NONE SEEN   Squamous Epithelial / HPF 0-5 0 - 5 /HPF   Mucus PRESENT    Hyaline Casts, UA PRESENT     Comment: Performed at [facility], [address], [place], [place] [address]    CT CHEST ABDOMEN PELVIS WO CONTRAST Result Date: 01/22/2024 EXAM: CT CHEST, ABDOMEN AND PELVIS WITHOUT CONTRAST 01/22/2024 02:21:05 PM TECHNIQUE: CT of the chest, abdomen and pelvis was performed without the administration of intravenous contrast. Multiplanar reformatted images are provided for review. Automated exposure control, iterative reconstruction, and/or weight based adjustment of the mA/kV was utilized to reduce the radiation dose to as low as reasonably achievable. COMPARISON: Chest radiograph of earlier today. CTA chest of 01/14/24. Most recent abdominal pelvic CT of 10/17/23. CLINICAL HISTORY: Sepsis. FINDINGS: CHEST: MEDIASTINUM AND LYMPH NODES: Moderate cardiomegaly. 3-vessel coronary artery calcification. Pulmonary artery enlargement, outflow tract 3.4 cm. LUNGS AND PLEURA: Significantly improved multifocal, bilateral pneumonia since 08/22. Right lower lobe, left lower lobe, and posterior left upper lobe/lingular airspace and ground glass opacities persist. Persistent scarring in the right middle lobe. Moderate right hemidiaphragm elevation. ABDOMEN AND PELVIS: LIVER: Fatty replaced pancreatic head and uncinate process. GALLBLADDER AND BILE DUCTS: No biliary ductal dilatation. SPLEEN: No acute abnormality. PANCREAS: Fatty replaced pancreatic head and uncinate process. Minimal pancreatic parenchymal calcification. ADRENAL GLANDS: Minimal right adrenal nodularity. KIDNEYS, URETERS AND BLADDER: Bilateral fluid density renal lesions are likely cysts. interpolar right renal exophytic 2.7 cm lesion favored to represent a complex cyst when correlated with 10/17/23 contrast-enhanced CT. No specific follow up indicated. Tiny interpolar left renal lesion is likely a hemorrhagic/proteinaceous  cyst. Interpolar punctate right renal collecting system calculus. Symmetric perinephric interstitial thickening is nonspecific. Median lobe impression into the urinary bladder. GI AND BOWEL: Fluid throughout the colon suggest a diarrheal state. Scattered colonic diverticula. Diffuse mild colonic distention including up to 7.7 cm. Lucency along the ascending colonic and cecal wall, suspicious for pneumatosis including an 81/3. Normal terminal ileum and a diminutive appendix. Fluid-filled mildly dilated small bowel throughout including a 4.6 cm on image 81/3. No transition point identified. 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 PERITONEUM AND RETROPERITONEUM: Small pelvic fluid is increased. Trace fluid in the right paracolic gutter. No free intraperitoneal or mesenteric air. VASCULATURE: Advanced aortic and branch vessel atherosclerosis. ABDOMINAL AND PELVIS LYMPH NODES: No lymphadenopathy. 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 BONES AND SOFT TISSUES: Fat-containing small ventral abdominal wall hernia. IMPRESSION: 1. Significantly improved multifocal, bilateral pneumonia since 01/14/24. Persistent airspace and ground glass opacities in the right lower lobe, left lower lobe, and posterior left upper lobe/lingula, likely residual pneumonia or  aspiration. 2. Gas along the wall of the ascending colon and cecum, suspicious for pneumatosis. Findings called to PA [place] at 3:19 pm. 3. Fluid-filled colon suggests a diarrheal state. Diffuse colonic and small bowel distention likely represent Ileus. 4. Increased small volume pelvic and new right-sided pericolonic trace fluid, likely secondary to the colonic proces. 5. Incidental findings, including: Pulmonary artery enlargement suggests pulmonary arterial hypertension. Aortic and coronary artery atherosclerosis. Right nephrolithiasis. Suspicion of minimal chronic calcific pancreatitis. Electronically signed by: Rockey Kilts MD  01/22/2024 03:21 PM EDT RP Workstation: [id number]   DG Chest Portable 1 View Result Date: 01/22/2024 EXAM: 1 VIEW XRAY OF THE CHEST 01/22/2024 12:58:00 PM COMPARISON: 01/17/2024 CLINICAL HISTORY: Cough. Per ED triage notes: Pt c.o generalized weakness and emesis this morning. Pt had cardiac stent placed on Monday and hasn't been feeling well since. Pt ill appearing in triage. Hypotensive in the 60s, pt taken straight back to RM 23, EDP at bedside. Best obtainable images due to pt condition. FINDINGS: LUNGS AND PLEURA: Low lung volumes. Bibasilar linear and patchy opacities. Small stable bilateral pleural effusion. HEART AND MEDIASTINUM: Elevated right hemidiaphragm. BONES AND SOFT TISSUES: Severe right shoulder degenerative changes. IMPRESSION: 1. Low lung volumes with bibasilar linear and patchy opacities. 2. Small stable bilateral pleural effusion. 3. Elevated right hemidiaphragm. Electronically signed by: Lonni Necessary MD 01/22/2024 01:16 PM EDT RP Workstation: [id number]    Review of Systems  Constitutional:  Negative for chills and fever.  HENT:  Negative for ear discharge, hearing loss and sore throat.   Eyes:  Negative for discharge.  Respiratory:  Negative for cough and shortness of breath.   Cardiovascular:  Negative for chest pain and leg swelling.  Gastrointestinal:  Positive for abdominal pain and diarrhea. Negative for constipation, nausea and vomiting.  Musculoskeletal:  Negative for myalgias and neck pain.  Skin:  Negative for rash.  Allergic/Immunologic: Negative for environmental allergies.  Neurological:  Negative for dizziness and seizures.  Hematological:  Does not bruise/bleed easily.  Psychiatric/Behavioral:  Negative for suicidal ideas.   All other systems reviewed and are negative.  Blood pressure 93/73, pulse 96, temperature 99.8 F (37.7 C), temperature source Oral, resp. rate (!) 26, SpO2 100%. Physical Exam Constitutional:      Appearance: He is  well-developed.     Comments: Conversant No acute distress  HENT:     Head: Normocephalic and atraumatic.  Eyes:     General: Lids are normal. No scleral icterus.    Pupils: Pupils are equal, round, and reactive to light.     Comments: Pupils are equal round and reactive No lid lag Moist conjunctiva  Neck:     Thyroid : No thyromegaly.     Trachea: No tracheal tenderness.     Comments: No cervical lymphadenopathy Cardiovascular:     Rate and Rhythm: Normal rate and regular rhythm.     Heart sounds: No murmur heard. Pulmonary:     Effort: Pulmonary effort is normal.     Breath sounds: Normal breath sounds. No wheezing or rales.  Abdominal:     General: Abdomen is protuberant. There is distension.     Tenderness: There is abdominal tenderness in the suprapubic area. There is no guarding or rebound.     Hernia: No hernia is present.  Musculoskeletal:     Cervical back: Normal range of motion and neck supple.  Skin:    General: Skin is warm.     Findings: No rash.     Nails: There is no  clubbing.     Comments: Normal skin turgor  Neurological:     Mental Status: He is alert and oriented to person, place, and time.     Comments: Normal gait and station  Psychiatric:        Mood and Affect: Mood normal.        Thought Content: Thought content normal.        Judgment: Judgment normal.     Comments: Appropriate affect     Assessment/Plan: 30 M with ileus and possible ischemic colitis Recent stent on cangrelor .  No real abd pain, states he is not more distended then normal. No n/v at this time.  IF he does begin with n/v would rec NGT, can hold off for now. No plans for surgery at this time. Will follow along  Chase Combs 01/22/2024, 7:49 PM

## 2024-01-22 NOTE — Progress Notes (Signed)
 CRITICAL VALUE STICKER  CRITICAL VALUE: Potassium 2.6  RECEIVER (on-site recipient of call):Minerva, RN  DATE & TIME NOTIFIED: 01/22/24 at 2303  MD NOTIFIED: Dr. Haze through [facility] RN  TIME OF NOTIFICATION:2304

## 2024-01-22 NOTE — Progress Notes (Signed)
 ED Pharmacy Antibiotic Sign Off An antibiotic consult was received from an ED provider for vancomycin  per pharmacy dosing for pneumonia. A chart review was completed to assess appropriateness.  A single dose of cefepime  and azithromycin was placed by the ED provider.   The following one time order(s) were placed per pharmacy consult:  vancomycin  1750 mg x 1 dose  Further antibiotic and/or antibiotic pharmacy consults should be ordered by the admitting provider if indicated.   Thank you for allowing pharmacy to be a part of this patient's care.   Dorn Buttner, PharmD, BCPS 01/22/2024 1:11 PM ED Clinical Pharmacist -  [phone]

## 2024-01-22 NOTE — ED Triage Notes (Signed)
 Pt c.o generalized weakness and emesis this morning. Pt had cardiac stent placed on Monday and hasn't been feeling well since. Pt ill appearing in triage. Hypotensive in the 60s, pt taken straight back to RM 23, EDP at bedside.

## 2024-01-22 NOTE — Consult Note (Signed)
   Cardiology Consultation   Patient ID: Chase Combs MRN: [id number]; DOB: 03/12/1946  Admit date: 01/22/2024 Date of Consult: 01/22/2024  PCP:  Wonda Worth SQUIBB, PA   History of Present Illness:   Chase Combs is a 78 year old man who I am seeing today for an evaluation of coronary artery disease with recent PCI at the request of Dr. Claudene.  The patient has a history of severe coronary artery disease with Synergy stent to the proximal LAD January 17, 2024.  Also with a history of diabetes, prostate cancer, sleep apnea.  His most recent hospitalization with PCI to the LAD was complicated by acute heart failure.  He came in today to the emergency department with worsening weakness, nausea, vomiting.  He also reports diarrhea.  Denies fevers.  He reports mild abdominal pain.  Workup in the emergency department was significant for pneumatosis given gas along the ascending colon, cecum.  His lactate was initially elevated.  His blood pressures are stable.   Past medical, surgical, social and family history reviewed.  ROS:  Please see the history of present illness.  All other ROS reviewed and negative.     Physical Exam/Data:   Vitals:   01/22/24 1446 01/22/24 1530 01/22/24 1600 01/22/24 1614  BP: (!) 94/51 (!) 107/49 111/79   Pulse:  (!) 102 (!) 44   Resp: (!) 30 (!) 30 (!) 23   Temp:    99.3 F (37.4 C)  TempSrc:      SpO2:  94% 94%     General: Chronically ill-appearing, elderly, laying flat in bed Cardiac: Warm extremities; regular rhythm, tachycardic Abdomen: Distended, tender to palpation right greater than left quadrants Lungs:  clear to auscultation bilaterally, no wheezing, rhonchi or rales  Psych:  Normal affect   EKG:  The EKG was personally reviewed and demonstrates: Sinus tachycardia Telemetry:  Telemetry was personally reviewed and demonstrates: Sinus tachycardia     Assessment and Plan:   #Coronary artery disease #Recent PCI to the proximal LAD The patient  had a recent PCI to the proximal LAD using a Synergy 3 mm x 20 mm drug-eluting stent.  There was a good post PCI result.  He presents with abdominal pain and was found to have gas along the ascending colon on CT scan and massively dilated loops of bowel.  Over the last few days he has had nausea and vomiting and there is concern that he has been unable to absorb his antiplatelet medication.  I discussed his case with the interventional cardiologist on-call, Dr. Peter [place], who recommended using IV cangrelor  while his treatment begins for ischemic colitis.  Plan discusses with Dr Claudene from ICU team.  #Ischemic colitis Management per MICU team.  Cardiology will follow along.   Ole T. Cindie, MD, [facility], [facility] Cardiac Electrophysiology

## 2024-01-22 NOTE — Progress Notes (Signed)
 Pharmacy Antibiotic Note  Chase Combs is a 78 y.o. male for which pharmacy has been consulted for zosyn  dosing for IAI.  SCr 2.5 - AKI WBC 2.4; LA 1.9; T 103.5; HR 50; RR 30   Cefepime , vancomycin , doxycycline, and flagyl  given in the ED  Plan: Zosyn  3.375g IV q8h (4 hour infusion) Monitor WBC, fever, renal function, cultures De-escalate when able     Temp (24hrs), Avg:100.7 F (38.2 C), Min:97.9 F (36.6 C), Max:103.5 F (39.7 C)  Recent Labs  Lab 01/16/24 0525 01/17/24 0541 01/18/24 0615 01/19/24 1129 01/22/24 1243 01/22/24 1259 01/22/24 1300 01/22/24 1500  WBC 9.4 11.8* 10.3  --  2.4*  --   --   --   CREATININE 0.71 0.59* 0.78 0.76 2.55* 2.50*  --   --   LATICACIDVEN  --   --   --   --   --   --  3.4* 1.9    Estimated Creatinine Clearance: 27.6 mL/min (A) (by C-G formula based on SCr of 2.5 mg/dL (H)).    Allergies  Allergen Reactions   Codeine Nausea And Vomiting   Latex Rash    Oral blisters when used at dentist   Microbiology results: Pending  Thank you for allowing pharmacy to be a part of this patient's care.  Dorn Buttner, PharmD, BCPS 01/22/2024 4:00 PM ED Clinical Pharmacist -  [phone]

## 2024-01-22 NOTE — ED Provider Notes (Signed)
 [place] [facility] DEPARTMENT AT [facility] Provider Note  CSN: [id number] Arrival date & time: 01/22/24 1214  Chief Complaint(s) Weakness and Emesis  HPI Chase Combs is a 78 y.o. male history of hypertension, coronary artery disease, diabetes, recent admission for pneumonia complicated by diagnosis of acute CHF also with coronary stent placement presenting with weakness.  Patient reports weakness since the hospital discharge but worsening today.  Reports associated nausea and vomiting today.  Has had some diarrhea.  No fevers.  He denies any abdominal pain.  No blood in the stool.  No hematemesis.  Has had some ongoing cough since hospital discharge.  Denies any chest pain   Past Medical History Past Medical History:  Diagnosis Date   Abdominal hernia    per pt   Arthritis    hands   Benign localized prostatic hyperplasia with lower urinary tract symptoms (LUTS)    CAD (coronary artery disease) 05/2004   cardiologist--- dr anner;  positive stress test , had outpt cardiac cath 05-27-2004 stenosis RI;  06-18-2004 cath w/ PCI and BMS x1 to pRI;   STEMI 02-16-2009  s/p cath w/ PCI thrombectomy for total occluded RCA , BMS x1 to pRCA;  nuclear stress test 03-15-2012 low risk no ischemia but sig inferior-inferolateral infarct , ef 51%   Dyslipidemia    Essential hypertension    Heart murmur    History of COVID-19 05/2020   per pt mild symptoms that resolved   History of ST elevation myocardial infarction (STEMI) 02/16/2009   inferior STEMI   cath w/ pci w/ BMS to pRCA   History of urinary retention    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   urologist--- dr borden/ radiation oncology-- dr patrcia;  dx 12/ 2022,  Gleason 4+5, PSA 24.6, volume 94.6cc;  plan to start IMRT   OSA (obstructive sleep apnea)    per pt dx approx 2008, no cpap intolerant   PONV (postoperative nausea and vomiting)    Pre-diabetes    S/p bare metal coronary artery stent    01/ 2006  x1 BMS  to pRI (CoStar study stent);  and 09/ 2010  x1 BMS to pRCA   Patient Active Problem List   Diagnosis Date Noted   Intestinal ischemia (HCC) 01/22/2024   Acute on chronic systolic CHF (congestive heart failure) (HCC) 01/17/2024   Ischemic cardiomyopathy 01/14/2024   Multifocal pneumonia 01/08/2024   Acute hypoxemic respiratory failure (HCC) 01/08/2024   Acute exacerbation of CHF (congestive heart failure) (HCC) 01/08/2024   Chest pain 01/08/2024   Transaminitis 10/21/2023   Acute diarrhea 10/19/2023   Abnormal finding on GI tract imaging 10/19/2023   Ileus, postoperative (HCC) 10/16/2023   Fever, unspecified 10/16/2023   Leukocytosis 10/16/2023   Hypokalemia 10/16/2023   Hypoxia 10/10/2023   Status post total knee replacement, 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   DOE (dyspnea on exertion) 06/21/2023   Atypical angina (HCC) 06/21/2023   Episode of dizziness 06/13/2022   Preop cardiovascular exam 07/03/2021   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(HCC) 06/17/2021   Hyperglycemia, unspecified 02/15/2020   Fatigue due to treatment 09/24/2017   Polypharmacy 06/10/2014   Hyperlipidemia associated with type 2 diabetes mellitus (HCC) 03/03/2013   Insomnia 03/03/2013   Essential hypertension    S/p ST elevation myocardial infarction (STEMI) of inferior wall (September 2010) 02/16/2009    Class: History of   CAD S/P percutaneous coronary angioplasty 06/18/2004   Home Medication(s) Prior to Admission medications   Medication Sig Start Date End  Date Taking? Authorizing Provider  acetaminophen  (TYLENOL ) 500 MG tablet Take 500 mg by mouth every 6 (six) hours as needed. Patient not taking: Reported on 01/08/2024    [provider]  amiodarone  (PACERONE ) 200 MG tablet Take 1 tablet (200 mg total) by mouth daily. 01/19/24   Fairy Frames, MD  aspirin  EC 81 MG tablet Take 81 mg by mouth 2 (two) times daily.    [provider]  calcium  carbonate (OSCAL) 1500 (600 Ca) MG TABS tablet Take 600 mg of  elemental calcium  by mouth every evening.    [provider]  clotrimazole-betamethasone (LOTRISONE) cream Apply 1 Application topically 2 (two) times daily.    [provider]  cyanocobalamin  (VITAMIN B12) 500 MCG tablet Take 500 mcg by mouth daily.    [provider]  furosemide  (LASIX ) 20 MG tablet Take 20 mg by mouth daily.    [provider]  furosemide  (LASIX ) 20 MG tablet Take 1 tablet (20 mg total) by mouth daily. 01/19/24   Croitoru, Mihai, MD  metoprolol  succinate (TOPROL -XL) 25 MG 24 hr tablet Take 1 tablet (25 mg total) by mouth daily. 01/19/24   Fairy Frames, MD  Multiple Vitamin (MULTIVITAMIN WITH MINERALS) TABS tablet Take 1 tablet by mouth in the morning.    [provider]  nitroGLYCERIN  (NITROSTAT ) 0.4 MG SL tablet Place 1 tablet (0.4 mg total) under the tongue every 5 (five) minutes as needed for chest pain. 01/19/24 01/18/25  Fairy Frames, MD  ondansetron  (ZOFRAN -ODT) 4 MG disintegrating tablet Take 1 tablet (4 mg total) by mouth every 8 (eight) hours as needed for nausea or vomiting. 01/21/24   Anner Alm ORN, MD  Probiotic Product (CULTURELLE PROBIOTICS) CHEW Chew 1 tablet by mouth daily.    [provider]  rosuvastatin  (CRESTOR ) 20 MG tablet TAKE 1 TABLET(20 MG) BY MOUTH DAILY 11/29/23   Anner Alm ORN, MD  sacubitril -valsartan  (ENTRESTO ) 49-51 MG Take 1 tablet by mouth 2 (two) times daily. 01/19/24   Fairy Frames, MD  spironolactone  (ALDACTONE ) 25 MG tablet Take 0.5 tablets (12.5 mg total) by mouth daily. 01/19/24   Fairy Frames, MD  tamsulosin  (FLOMAX ) 0.4 MG CAPS capsule Take 1 capsule (0.4 mg total) by mouth daily after supper. 11/17/21   Patrcia Cough, MD  ticagrelor  (BRILINTA ) 90 MG TABS tablet Take 1 tablet (90 mg total) by mouth 2 (two) times daily. 01/19/24   Fairy Frames, MD  traMADol  (ULTRAM ) 50 MG tablet Take 1 tablet (50 mg total) by mouth every 6 (six) hours as needed for moderate pain (pain score  4-6) or severe pain (pain score 7-10). Patient not taking: Reported on 01/08/2024 10/27/23   Sonjia Held, MD                                                                                                                                    Past Surgical History Past Surgical History:  Procedure Laterality  Date   CARDIAC CATHETERIZATION  05/27/2004   outpatient by dr burnard [facility] cardiology) for positive stress test;  80% stensis pRI   CORONARY ANGIOPLASTY WITH STENT PLACEMENT  06/18/2004   @[facility]  by Dr Lavon;  PCI w/ BMS to pRI  (CoStar study stent - 2.5 x 16)   CORONARY ANGIOPLASTY WITH STENT PLACEMENT  02/16/2009   @[facility]  by dr verlin;   Inferior STEMI:  PCI w/ thrombectomy total occluded RCA, BMS x1 to pRCA (Vision BMS 3.5 x 23 -> 4.0 mm), residual 40-50% pLAD, ef 45-50%   CORONARY PRESSURE/FFR STUDY N/A 01/17/2024   Procedure: CORONARY PRESSURE/FFR STUDY;  Surgeon: Mady Bruckner, MD;  Location: [facility];  Service: Cardiovascular;  Laterality: N/A;   CORONARY STENT INTERVENTION N/A 01/17/2024   Procedure: CORONARY STENT INTERVENTION;  Surgeon: Mady Bruckner, MD;  Location: [facility];  Service: Cardiovascular;  Laterality: N/A;   CORONARY ULTRASOUND/IVUS N/A 01/17/2024   Procedure: Coronary Ultrasound/IVUS;  Surgeon: Mady Bruckner, MD;  Location: [facility];  Service: Cardiovascular;  Laterality: N/A;   FLEXIBLE SIGMOIDOSCOPY N/A 10/20/2023   Procedure: KINGSTON SIDE;  Surgeon: Legrand Victory LITTIE DOUGLAS, MD;  Location: [facility];  Service: Gastroenterology;  Laterality: N/A;   GOLD SEED IMPLANT N/A 09/19/2021   Procedure: GOLD SEED IMPLANT;  Surgeon: Cam Morene ORN, MD;  Location: [facility];  Service: Urology;  Laterality: N/A;  ONLY NEEDS 30 MIN FOR ALL   INGUINAL HERNIA REPAIR Right 09/02/1999   @[facility] ;   recurrent repair right inguinal hernia;   previous repair approx 1990s   INGUINAL HERNIA REPAIR Left    1990s   KNEE  ARTHROSCOPY W/ MENISCAL REPAIR Right 12/18/2005   @[facility]    LUMBAR DISC SURGERY  1982   L5--S1   RIGHT/LEFT HEART CATH AND CORONARY ANGIOGRAPHY N/A 01/17/2024   Procedure: RIGHT/LEFT HEART CATH AND CORONARY ANGIOGRAPHY;  Surgeon: Mady Bruckner, MD;  Location: [facility];  Service: Cardiovascular;  Laterality: N/A;   SPACE OAR INSTILLATION N/A 09/19/2021   Procedure: SPACE OAR INSTILLATION;  Surgeon: Cam Morene ORN, MD;  [facility];  Service: Urology;  Laterality: N/A;   TOTAL KNEE ARTHROPLASTY Left 10/01/2023   Procedure: ARTHROPLASTY, KNEE, TOTAL;  Surgeon: Kay Kemps, MD;  Location: [facility];  Service: Orthopedics;  Laterality: Left;   Family History Family History  Problem Relation Age of Onset   Lung cancer Mother    Colon cancer Neg Hx    Esophageal cancer Neg Hx     Social History Social History   Tobacco Use   Smoking status: Never   Smokeless tobacco: Never  Vaping Use   Vaping status: Never Used  Substance Use Topics   Alcohol use: No    Alcohol/week: 0.0 standard drinks of alcohol   Drug use: Never   Allergies Codeine and Latex  Review of Systems Review of Systems  All other systems reviewed and are negative.   Physical Exam Vital Signs  I have reviewed the triage vital signs BP (!) 94/51   Pulse (!) 50   Temp (!) 103.5 F (39.7 C) (Rectal)   Resp (!) 30   SpO2 94%  Physical Exam Vitals and nursing note reviewed.  Constitutional:      General: He is not in acute distress.    Appearance: Normal appearance. He is ill-appearing.  HENT:     Mouth/Throat:     Mouth: Mucous membranes are dry.  Eyes:     Conjunctiva/sclera:  Conjunctivae normal.  Cardiovascular:     Rate and Rhythm: Normal rate and regular rhythm.  Pulmonary:     Effort: Pulmonary effort is normal. No respiratory distress.     Breath sounds: Rales (bibasilar) present.  Abdominal:     General: Abdomen is flat. There is distension.     Palpations:  Abdomen is soft.     Tenderness: There is no abdominal tenderness.     Hernia: A hernia (umbilical) is present.  Musculoskeletal:     Right lower leg: Edema present.     Left lower leg: Edema present.  Skin:    General: Skin is warm and dry.     Capillary Refill: Capillary refill takes less than 2 seconds.  Neurological:     Mental Status: He is alert and oriented to person, place, and time. Mental status is at baseline.  Psychiatric:        Mood and Affect: Mood normal.        Behavior: Behavior normal.     ED Results and Treatments Labs (all labs ordered are listed, but only abnormal results are displayed) Labs Reviewed  COMPREHENSIVE METABOLIC PANEL WITH GFR - Abnormal; Notable for the following components:      Result Value   Sodium 132 (*)    Chloride 81 (*)    Glucose, Bld 197 (*)    BUN 64 (*)    Creatinine, Ser 2.55 (*)    Calcium  8.6 (*)    Total Protein 6.2 (*)    Albumin 3.0 (*)    Total Bilirubin 1.4 (*)    GFR, Estimated 25 (*)    Anion gap 20 (*)    All other components within normal limits  CBC WITH DIFFERENTIAL/PLATELET - Abnormal; Notable for the following components:   WBC 2.4 (*)    RDW 16.9 (*)    Platelets 489 (*)    Lymphs Abs 0.3 (*)    Abs Immature Granulocytes 0.10 (*)    All other components within normal limits  BRAIN NATRIURETIC PEPTIDE - Abnormal; Notable for the following components:   B Natriuretic Peptide 220.4 (*)    All other components within normal limits  I-STAT CHEM 8, ED - Abnormal; Notable for the following components:   Sodium 131 (*)    Chloride 87 (*)    BUN 65 (*)    Creatinine, Ser 2.50 (*)    Glucose, Bld 199 (*)    Calcium , Ion 0.96 (*)    TCO2 34 (*)    All other components within normal limits  I-STAT CG4 LACTIC ACID, ED - Abnormal; Notable for the following components:   Lactic Acid, Venous 3.4 (*)    All other components within normal limits  TROPONIN I (HIGH SENSITIVITY) - Abnormal; Notable for the following  components:   Troponin I (High Sensitivity) 126 (*)    All other components within normal limits  CULTURE, BLOOD (ROUTINE X 2)  CULTURE, BLOOD (ROUTINE X 2)  C DIFFICILE QUICK SCREEN W PCR REFLEX    MRSA NEXT GEN BY PCR, NASAL  MRSA NEXT GEN BY PCR, NASAL  PROCALCITONIN  URINALYSIS, W/ REFLEX TO CULTURE (INFECTION SUSPECTED)  I-STAT CG4 LACTIC ACID, ED  TROPONIN I (HIGH SENSITIVITY)  Radiology CT CHEST ABDOMEN PELVIS WO CONTRAST Result Date: 01/22/2024 EXAM: CT CHEST, ABDOMEN AND PELVIS WITHOUT CONTRAST 01/22/2024 02:21:05 PM TECHNIQUE: CT of the chest, abdomen and pelvis was performed without the administration of intravenous contrast. Multiplanar reformatted images are provided for review. Automated exposure control, iterative reconstruction, and/or weight based adjustment of the mA/kV was utilized to reduce the radiation dose to as low as reasonably achievable. COMPARISON: Chest radiograph of earlier today. CTA chest of 01/14/24. Most recent abdominal pelvic CT of 10/17/23. CLINICAL HISTORY: Sepsis. FINDINGS: CHEST: MEDIASTINUM AND LYMPH NODES: Moderate cardiomegaly. 3-vessel coronary artery calcification. Pulmonary artery enlargement, outflow tract 3.4 cm. LUNGS AND PLEURA: Significantly improved multifocal, bilateral pneumonia since 08/22. Right lower lobe, left lower lobe, and posterior left upper lobe/lingular airspace and ground glass opacities persist. Persistent scarring in the right middle lobe. Moderate right hemidiaphragm elevation. ABDOMEN AND PELVIS: LIVER: Fatty replaced pancreatic head and uncinate process. GALLBLADDER AND BILE DUCTS: No biliary ductal dilatation. SPLEEN: No acute abnormality. PANCREAS: Fatty replaced pancreatic head and uncinate process. Minimal pancreatic parenchymal calcification. ADRENAL GLANDS: Minimal right adrenal nodularity. KIDNEYS, URETERS  AND BLADDER: Bilateral fluid density renal lesions are likely cysts. interpolar right renal exophytic 2.7 cm lesion favored to represent a complex cyst when correlated with 10/17/23 contrast-enhanced CT. No specific follow up indicated. Tiny interpolar left renal lesion is likely a hemorrhagic/proteinaceous cyst. Interpolar punctate right renal collecting system calculus. Symmetric perinephric interstitial thickening is nonspecific. Median lobe impression into the urinary bladder. GI AND BOWEL: Fluid throughout the colon suggest a diarrheal state. Scattered colonic diverticula. Diffuse mild colonic distention including up to 7.7 cm. Lucency along the ascending colonic and cecal wall, suspicious for pneumatosis including an 81/3. Normal terminal ileum and a diminutive appendix. Fluid-filled mildly dilated small bowel throughout including a 4.6 cm on image 81/3. No transition point identified. 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 PERITONEUM AND RETROPERITONEUM: Small pelvic fluid is increased. Trace fluid in the right paracolic gutter. No free intraperitoneal or mesenteric air. VASCULATURE: Advanced aortic and branch vessel atherosclerosis. ABDOMINAL AND PELVIS LYMPH NODES: No lymphadenopathy. 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 BONES AND SOFT TISSUES: Fat-containing small ventral abdominal wall hernia. IMPRESSION: 1. Significantly improved multifocal, bilateral pneumonia since 01/14/24. Persistent airspace and ground glass opacities in the right lower lobe, left lower lobe, and posterior left upper lobe/lingula, likely residual pneumonia or aspiration. 2. Gas along the wall of the ascending colon and cecum, suspicious for pneumatosis. Findings called to PA [place] at 3:19 pm. 3. Fluid-filled colon suggests a diarrheal state. Diffuse colonic and small bowel distention likely represent Ileus. 4. Increased small volume pelvic and new right-sided pericolonic trace fluid,  likely secondary to the colonic proces. 5. Incidental findings, including: Pulmonary artery enlargement suggests pulmonary arterial hypertension. Aortic and coronary artery atherosclerosis. Right nephrolithiasis. Suspicion of minimal chronic calcific pancreatitis. Electronically signed by: Rockey Kilts MD 01/22/2024 03:21 PM EDT RP Workstation: [id number]   DG Chest Portable 1 View Result Date: 01/22/2024 EXAM: 1 VIEW XRAY OF THE CHEST 01/22/2024 12:58:00 PM COMPARISON: 01/17/2024 CLINICAL HISTORY: Cough. Per ED triage notes: Pt c.o generalized weakness and emesis this morning. Pt had cardiac stent placed on Monday and hasn't been feeling well since. Pt ill appearing in triage. Hypotensive in the 60s, pt taken straight back to RM 23, EDP at bedside. Best obtainable images due to pt condition. FINDINGS: LUNGS AND PLEURA: Low lung volumes. Bibasilar linear and patchy opacities. Small stable bilateral pleural effusion. HEART AND MEDIASTINUM: Elevated right  hemidiaphragm. BONES AND SOFT TISSUES: Severe right shoulder degenerative changes. IMPRESSION: 1. Low lung volumes with bibasilar linear and patchy opacities. 2. Small stable bilateral pleural effusion. 3. Elevated right hemidiaphragm. Electronically signed by: Lonni Necessary MD 01/22/2024 01:16 PM EDT RP Workstation: [id number]    Pertinent labs & imaging results that were available during my care of the patient were reviewed by me and considered in my medical decision making (see MDM for details).  Medications Ordered in ED Medications  vancomycin  (VANCOREADY) IVPB 1750 mg/350 mL (1,750 mg Intravenous New Bag/Given 01/22/24 1538)  doxycycline (VIBRAMYCIN) 100 mg in sodium chloride  0.9 % 250 mL IVPB (has no administration in time range)  metroNIDAZOLE  (FLAGYL ) IVPB 500 mg (has no administration in time range)  Chlorhexidine  Gluconate Cloth 2 % PADS 6 each (has no administration in time range)  docusate sodium  (COLACE) capsule 100 mg (has no  administration in time range)  polyethylene glycol (MIRALAX  / GLYCOLAX ) packet 17 g (has no administration in time range)  piperacillin -tazobactam (ZOSYN ) IVPB 3.375 g (has no administration in time range)  acetaminophen  (TYLENOL ) tablet 1,000 mg (1,000 mg Oral Given 01/22/24 1322)  lactated ringers  bolus 1,000 mL (0 mLs Intravenous Stopped 01/22/24 1539)  ceFEPIme  (MAXIPIME ) 2 g in sodium chloride  0.9 % 100 mL IVPB (0 g Intravenous Stopped 01/22/24 1515)  sodium chloride  0.9 % bolus 500 mL (500 mLs Intravenous New Bag/Given 01/22/24 1539)                                                                                                                                     Procedures Procedures  (including critical care time)  Medical Decision Making / ED Course   MDM:  78 year old male presenting with weakness.  Patient ill-appearing.  Initially hypotensive in triage which on recheck was improved.  Was hypoxic placed on 2 L.  Also found to be febrile with tachycardia and tachypnea.  Suspect likely sepsis.  Initially considered pneumonia given recent hospitalization pulmonary crackles.  Patient also had evidence of abdominal distention.  He has no tenderness.  He did have diarrhea so we will send C. difficile given recent hospitalization.  CT chest abdomen pelvis was obtained to evaluate for intra-abdominal source for infection which showed that the pneumonia was significantly from hospitalization previously.  However, CT abdomen did not show distention of the colon with some pneumatosis and fluid-filled bowel loops.  Given pneumatosis discussed with general surgery Dr. Aron, will consult.  Labs initially notable for elevated lactate to 3.4.  Patient received fluids and this is improved.  Not give 30 cc/kg fluid bolus given CHF but will continue to monitor lactic acid is repeat lactic is normalized.  He is covered with broad-spectrum antibiotics his troponin is elevated but he denies any chest pain  suspect this is due to his illness.  Given his critical illness discussed with ICU physician Dr. Claudene will admit  patient.  Additional history obtained: -Additional history obtained from spouse -External records from outside source obtained and reviewed including: Chart review including previous notes, labs, imaging, consultation notes including prior hospitalization notes    Lab Tests: -I ordered, reviewed, and interpreted labs.   The pertinent results include:   Labs Reviewed  COMPREHENSIVE METABOLIC PANEL WITH GFR - Abnormal; Notable for the following components:      Result Value   Sodium 132 (*)    Chloride 81 (*)    Glucose, Bld 197 (*)    BUN 64 (*)    Creatinine, Ser 2.55 (*)    Calcium  8.6 (*)    Total Protein 6.2 (*)    Albumin 3.0 (*)    Total Bilirubin 1.4 (*)    GFR, Estimated 25 (*)    Anion gap 20 (*)    All other components within normal limits  CBC WITH DIFFERENTIAL/PLATELET - Abnormal; Notable for the following components:   WBC 2.4 (*)    RDW 16.9 (*)    Platelets 489 (*)    Lymphs Abs 0.3 (*)    Abs Immature Granulocytes 0.10 (*)    All other components within normal limits  BRAIN NATRIURETIC PEPTIDE - Abnormal; Notable for the following components:   B Natriuretic Peptide 220.4 (*)    All other components within normal limits  I-STAT CHEM 8, ED - Abnormal; Notable for the following components:   Sodium 131 (*)    Chloride 87 (*)    BUN 65 (*)    Creatinine, Ser 2.50 (*)    Glucose, Bld 199 (*)    Calcium , Ion 0.96 (*)    TCO2 34 (*)    All other components within normal limits  I-STAT CG4 LACTIC ACID, ED - Abnormal; Notable for the following components:   Lactic Acid, Venous 3.4 (*)    All other components within normal limits  TROPONIN I (HIGH SENSITIVITY) - Abnormal; Notable for the following components:   Troponin I (High Sensitivity) 126 (*)    All other components within normal limits  CULTURE, BLOOD (ROUTINE X 2)  CULTURE, BLOOD  (ROUTINE X 2)  C DIFFICILE QUICK SCREEN W PCR REFLEX    MRSA NEXT GEN BY PCR, NASAL  MRSA NEXT GEN BY PCR, NASAL  PROCALCITONIN  URINALYSIS, W/ REFLEX TO CULTURE (INFECTION SUSPECTED)  I-STAT CG4 LACTIC ACID, ED  TROPONIN I (HIGH SENSITIVITY)    Notable for AKI, elevatedBNP, elevated troponin, leukopenia   EKG   EKG Interpretation Date/Time:  Saturday January 22 2024 12:40:17 EDT Ventricular Rate:  120 PR Interval:    QRS Duration:  142 QT Interval:  357 QTC Calculation: 501 R Axis:   -73  Text Interpretation: Normal sinus rhythm frequent pvc Nonspecific IVCD with LAD Left ventricular hypertrophy Confirmed by Francesca Fallow ([id number]) on 01/22/2024 12:48:59 PM         Imaging Studies ordered: I ordered imaging studies including CT abdomen/chest On my interpretation imaging demonstrates pneumatosis in colon I independently visualized and interpreted imaging. I agree with the radiologist interpretation   Medicines ordered and prescription drug management: Meds ordered this encounter  Medications   acetaminophen  (TYLENOL ) tablet 1,000 mg   lactated ringers  bolus 1,000 mL   DISCONTD: azithromycin (ZITHROMAX) 500 mg in sodium chloride  0.9 % 250 mL IVPB   ceFEPIme  (MAXIPIME ) 2 g in sodium chloride  0.9 % 100 mL IVPB    Antibiotic Indication::   HCAP   vancomycin  (VANCOREADY) IVPB 1750 mg/350 mL    Indication::  Pneumonia   doxycycline (VIBRAMYCIN) 100 mg in sodium chloride  0.9 % 250 mL IVPB    Antibiotic Indication::   CAP   sodium chloride  0.9 % bolus 500 mL   metroNIDAZOLE  (FLAGYL ) IVPB 500 mg    Antibiotic Indication::   Intra-abdominal Infection   Chlorhexidine  Gluconate Cloth 2 % PADS 6 each   docusate sodium  (COLACE) capsule 100 mg   polyethylene glycol (MIRALAX  / GLYCOLAX ) packet 17 g   piperacillin -tazobactam (ZOSYN ) IVPB 3.375 g    -I have reviewed the patients home medicines and have made adjustments as needed   Consultations Obtained: I requested  consultation with the general surgeon,  and discussed lab and imaging findings as well as pertinent plan - they recommend: they will consult   Cardiac Monitoring: The patient was maintained on a cardiac monitor.  I personally viewed and interpreted the cardiac monitored which showed an underlying rhythm of: sinus tachycardia    Reevaluation: After the interventions noted above, I reevaluated the patient and found that their symptoms have improved  Co morbidities that complicate the patient evaluation  Past Medical History:  Diagnosis Date   Abdominal hernia    per pt   Arthritis    hands   Benign localized prostatic hyperplasia with lower urinary tract symptoms (LUTS)    CAD (coronary artery disease) 05/2004   cardiologist--- dr anner;  positive stress test , had outpt cardiac cath 05-27-2004 stenosis RI;  06-18-2004 cath w/ PCI and BMS x1 to pRI;   STEMI 02-16-2009  s/p cath w/ PCI thrombectomy for total occluded RCA , BMS x1 to pRCA;  nuclear stress test 03-15-2012 low risk no ischemia but sig inferior-inferolateral infarct , ef 51%   Dyslipidemia    Essential hypertension    Heart murmur    History of COVID-19 05/2020   per pt mild symptoms that resolved   History of ST elevation myocardial infarction (STEMI) 02/16/2009   inferior STEMI   cath w/ pci w/ BMS to pRCA   History of urinary retention    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   urologist--- dr borden/ radiation oncology-- dr patrcia;  dx 12/ 2022,  Gleason 4+5, PSA 24.6, volume 94.6cc;  plan to start IMRT   OSA (obstructive sleep apnea)    per pt dx approx 2008, no cpap intolerant   PONV (postoperative nausea and vomiting)    Pre-diabetes    S/p bare metal coronary artery stent    01/ 2006  x1 BMS to pRI (CoStar study stent);  and 09/ 2010  x1 BMS to pRCA      Dispostion: Disposition decision including need for hospitalization was considered, and patient admitted to the hospital.    Final Clinical  Impression(s) / ED Diagnoses Final diagnoses:  Sepsis, due to unspecified organism, unspecified whether acute organ dysfunction present (HCC)  Pneumatosis intestinalis of large intestine     This chart was dictated using voice recognition software.  Despite best efforts to proofread,  errors can occur which can change the documentation meaning.    Francesca Elsie CROME, MD 01/22/24 [phone]

## 2024-01-22 NOTE — H&P (Signed)
 NAME:  Chase Combs, MRN:  [id number], DOB:  [birth year], LOS: 0 ADMISSION DATE:  01/22/2024, CONSULTATION DATE:  01/22/24 REFERRING MD:  EDP, CHIEF COMPLAINT:  abd pain   History of Present Illness:  78 year old man w/ hx of ischemic cardiomyopathy and very recent LAD stent placement for NSTEMI discharged 8/27 (no d/c summary so unclear) with progressive nausea vomiting with CT images concerning for ischemic colitis.  Feeling weak and unwell since discharge 3 days ago.  Progressive nausea and vomiting not keeping down his antiplatelet medicines.  CT images reveal intestinal air in the wall of the intestine as well as concerning findings of ileus.  His lactate was elevated.  He was volume resuscitated.  Remained hypotensive.  Placed on broad-spectrum antibiotics he received vancomycin  Zosyn  doxycycline and Flagyl .  Labs notable for acute renal failure.  Anion gap metabolic acidosis.  Leukopenia, lymphopenia.  Pertinent  Medical History  CAD, CHF  Significant Hospital Events: Including procedures, antibiotic start and stop dates in addition to other pertinent events     Interim History / Subjective:    Objective    Blood pressure 111/79, pulse (!) 44, temperature 99.3 F (37.4 C), resp. rate (!) 23, SpO2 94%.       No intake or output data in the 24 hours ending 01/22/24 1618 There were no vitals filed for this visit.  Examination: General: Lying in stretcher, ill-appearing HENT: Atraumatic normocephalic Lungs: Tachypneic, no accessory muscle use, on 2 L nasal cannula Cardiovascular: Tachycardic, no edema Abdomen: Mild tender to palpation diffusely more in the lower quadrants, right greater than left Extremities: No edema noted Neuro: Alert conversant no focal deficits   Resolved problem list   Assessment and Plan   Intestinal pneumatosis concerning for ischemic colitis: Severe arthrosclerotic disease as evidenced by severe CAD.  Lactic acidosis.  Nausea vomiting. -- PCCM  team discussed with surgery, not a surgical candidate currently, if this is ischemic colitis high likelihood of mortality without surgical correction -- Narrow antibiotics to Zosyn  given intra-abdominal source of presumed sepsis continue Flagyl  for now await C. difficile testing  Severe sepsis/septic shock/hypovolemic shock: Due to intra-abdominal source for ischemic colitis.  Hypovolemia with nausea vomiting. --Broad-spectrum antibiotics as above --Status post 1.5 L, additional 1 L crystalloid to achieve 30 cc/kg -- MAP goal greater than 65, pressor support with norepinephrine  if needed  Previous pneumonia: Significant proved on repeat cross-sectional imaging. -- Stop tailored towards pneumonia, narrowed to intra-abdominal source  Acute renal failure: Suspect hypovolemia, potentially ATN from low blood flow with presumed ischemic colitis. -- Hold home Entresto , Lasix , Toprol  Aldactone  tamsulosin   CAD: With fresh stent to LAD. -- Unable to tolerate orals, appreciate cardiology assistance with antiplatelet agents  Leukopenia: Likely sequela of severe sepsis -- Supportive care  Labs   CBC: Recent Labs  Lab 01/16/24 0525 01/17/24 0541 01/17/24 1131 01/17/24 1132 01/18/24 0615 01/22/24 1243 01/22/24 1259  WBC 9.4 11.8*  --   --  10.3 2.4*  --   NEUTROABS 8.5* 10.6*  --   --   --  1.7  --   HGB 11.5* 13.3 13.9 13.9 12.3* 13.9 15.3  HCT 37.1* 45.1 41.0 41.0 37.9* 43.2 45.0  MCV 91.8 97.8  --   --  89.0 88.5  --   PLT 349 420*  --   --  406* 489*  --     Basic Metabolic Panel: Recent Labs  Lab 01/16/24 0525 01/17/24 0541 01/17/24 1131 01/17/24 1132 01/18/24 0615 01/19/24  1129 01/22/24 1243 01/22/24 1259  NA 134* 132*   < > 133* 132* 133* 132* 131*  K 3.7 3.6   < > 3.1* 3.2* 3.5 4.5 4.3  CL 88* 89*  --   --  88* 88* 81* 87*  CO2 35* 30  --   --  33* 34* 31  --   GLUCOSE 243* 243*  --   --  169* 169* 197* 199*  BUN 20 23  --   --  24* 21 64* 65*  CREATININE 0.71 0.59*   --   --  0.78 0.76 2.55* 2.50*  CALCIUM  8.4* 8.7*  --   --  8.4* 8.5* 8.6*  --   MG 2.1 2.2  --   --   --  2.2  --   --   PHOS 3.6 3.7  --   --   --   --   --   --    < > = values in this interval not displayed.   GFR: Estimated Creatinine Clearance: 27.6 mL/min (A) (by C-G formula based on SCr of 2.5 mg/dL (H)). Recent Labs  Lab 01/16/24 0525 01/17/24 0541 01/18/24 0615 01/22/24 1243 01/22/24 1300 01/22/24 1500  PROCALCITON  --   --   --  1.23  --   --   WBC 9.4 11.8* 10.3 2.4*  --   --   LATICACIDVEN  --   --   --   --  3.4* 1.9    Liver Function Tests: Recent Labs  Lab 01/16/24 0525 01/17/24 0541 01/22/24 1243  AST 35 37 29  ALT 60* 75* 31  ALKPHOS 51 66 64  BILITOT 0.6 0.7 1.4*  PROT 5.6* 6.2* 6.2*  ALBUMIN 2.8* 3.2* 3.0*   No results for input(s): LIPASE, AMYLASE in the last 168 hours. No results for input(s): AMMONIA in the last 168 hours.  ABG    Component Value Date/Time   PHART 7.539 (H) 01/17/2024 1132   PCO2ART 46.9 01/17/2024 1132   PO2ART 61 (L) 01/17/2024 1132   HCO3 40.0 (H) 01/17/2024 1132   TCO2 34 (H) 01/22/2024 1259   ACIDBASEDEF 1.0 12/08/2006 1859   O2SAT 93 01/17/2024 1132     Coagulation Profile: No results for input(s): INR, PROTIME in the last 168 hours.  Cardiac Enzymes: No results for input(s): CKTOTAL, CKMB, CKMBINDEX, TROPONINI in the last 168 hours.  HbA1C: Hgb A1c MFr Bld  Date/Time Value Ref Range Status  09/22/2023 08:46 AM 6.4 (H) 4.8 - 5.6 % Final    Comment:    (NOTE) Pre diabetes:          5.7%-6.4%  Diabetes:              >6.4%  Glycemic control for   <7.0% adults with diabetes   03/01/2020 09:31 AM 6.9 (H) 4.8 - 5.6 % Final    Comment:             Prediabetes: 5.7 - 6.4          Diabetes: >6.4          Glycemic control for adults with diabetes: <7.0     CBG: No results for input(s): GLUCAP in the last 168 hours.  Review of Systems:   No chest pain, comprehensive review of systems  otherwise negative.  Past Medical History:  He,  has a past medical history of Abdominal hernia, Arthritis, Benign localized prostatic hyperplasia with lower urinary tract symptoms (LUTS), CAD (coronary artery disease) (05/2004),  Dyslipidemia, Essential hypertension, Heart murmur, History of COVID-19 (05/2020), History of ST elevation myocardial infarction (STEMI) (02/16/2009), History of urinary retention, 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(HCC) (04/2021), OSA (obstructive sleep apnea), PONV (postoperative nausea and vomiting), Pre-diabetes, and S/p bare metal coronary artery stent.   Surgical History:   Past Surgical History:  Procedure Laterality Date   CARDIAC CATHETERIZATION  05/27/2004   outpatient by dr [facility] cardiology) for positive stress test;  80% stensis pRI   CORONARY ANGIOPLASTY WITH STENT PLACEMENT  06/18/2004   @[facility]  by Dr Lavon;  PCI w/ BMS to pRI  (CoStar study stent - 2.5 x 16)   CORONARY ANGIOPLASTY WITH STENT PLACEMENT  02/16/2009   @[facility]  by dr verlin;   Inferior STEMI:  PCI w/ thrombectomy total occluded RCA, BMS x1 to pRCA (Vision BMS 3.5 x 23 -> 4.0 mm), residual 40-50% pLAD, ef 45-50%   CORONARY PRESSURE/FFR STUDY N/A 01/17/2024   Procedure: CORONARY PRESSURE/FFR STUDY;  Surgeon: Mady Bruckner, MD;  Location: [facility];  Service: Cardiovascular;  Laterality: N/A;   CORONARY STENT INTERVENTION N/A 01/17/2024   Procedure: CORONARY STENT INTERVENTION;  Surgeon: Mady Bruckner, MD;  Location: [facility];  Service: Cardiovascular;  Laterality: N/A;   CORONARY ULTRASOUND/IVUS N/A 01/17/2024   Procedure: Coronary Ultrasound/IVUS;  Surgeon: Mady Bruckner, MD;  Location: [facility];  Service: Cardiovascular;  Laterality: N/A;   FLEXIBLE SIGMOIDOSCOPY N/A 10/20/2023   Procedure: KINGSTON SIDE;  Surgeon: Legrand Victory LITTIE DOUGLAS, MD;  Location: [facility];  Service: Gastroenterology;  Laterality: N/A;   GOLD SEED IMPLANT N/A 09/19/2021    Procedure: GOLD SEED IMPLANT;  Surgeon: Cam Morene ORN, MD;  Location: [facility];  Service: Urology;  Laterality: N/A;  ONLY NEEDS 30 MIN FOR ALL   INGUINAL HERNIA REPAIR Right 09/02/1999   @[facility] ;   recurrent repair right inguinal hernia;   previous repair approx 1990s   INGUINAL HERNIA REPAIR Left    1990s   KNEE ARTHROSCOPY W/ MENISCAL REPAIR Right 12/18/2005   @[facility]    LUMBAR DISC SURGERY  1982   L5--S1   RIGHT/LEFT HEART CATH AND CORONARY ANGIOGRAPHY N/A 01/17/2024   Procedure: RIGHT/LEFT HEART CATH AND CORONARY ANGIOGRAPHY;  Surgeon: Mady Bruckner, MD;  Location: [facility];  Service: Cardiovascular;  Laterality: N/A;   SPACE OAR INSTILLATION N/A 09/19/2021   Procedure: SPACE OAR INSTILLATION;  Surgeon: Cam Morene ORN, MD;  Location: [facility];  Service: Urology;  Laterality: N/A;   TOTAL KNEE ARTHROPLASTY Left 10/01/2023   Procedure: ARTHROPLASTY, KNEE, TOTAL;  Surgeon: Kay Kemps, MD;  Location: [facility];  Service: Orthopedics;  Laterality: Left;     Social History:   reports that he has never smoked. He has never used smokeless tobacco. He reports that he does not drink alcohol and does not use drugs.   Family History:  His family history includes Lung cancer in his mother. There is no history of Colon cancer or Esophageal cancer.   Allergies Allergies  Allergen Reactions   Codeine Nausea And Vomiting   Latex Rash    Oral blisters when used at dentist     Home Medications  Prior to Admission medications   Medication Sig Start Date End Date Taking? Authorizing Provider  acetaminophen  (TYLENOL ) 500 MG tablet Take 500 mg by mouth every 6 (six) hours as needed. Patient not taking: Reported on 01/08/2024    [provider]  amiodarone  (PACERONE ) 200 MG tablet Take 1 tablet (200  mg total) by mouth daily. 01/19/24   Fairy Frames, MD  aspirin  EC 81 MG tablet Take 81 mg by mouth 2 (two) times daily.    [provider]  calcium  carbonate (OSCAL) 1500 (600 Ca) MG TABS tablet Take 600 mg of elemental calcium  by mouth every evening.    [provider]  clotrimazole-betamethasone (LOTRISONE) cream Apply 1 Application topically 2 (two) times daily.    [provider]  cyanocobalamin  (VITAMIN B12) 500 MCG tablet Take 500 mcg by mouth daily.    [provider]  furosemide  (LASIX ) 20 MG tablet Take 20 mg by mouth daily.    [provider]  furosemide  (LASIX ) 20 MG tablet Take 1 tablet (20 mg total) by mouth daily. 01/19/24   Croitoru, Mihai, MD  metoprolol  succinate (TOPROL -XL) 25 MG 24 hr tablet Take 1 tablet (25 mg total) by mouth daily. 01/19/24   Fairy Frames, MD  Multiple Vitamin (MULTIVITAMIN WITH MINERALS) TABS tablet Take 1 tablet by mouth in the morning.    [provider]  nitroGLYCERIN  (NITROSTAT ) 0.4 MG SL tablet Place 1 tablet (0.4 mg total) under the tongue every 5 (five) minutes as needed for chest pain. 01/19/24 01/18/25  Fairy Frames, MD  ondansetron  (ZOFRAN -ODT) 4 MG disintegrating tablet Take 1 tablet (4 mg total) by mouth every 8 (eight) hours as needed for nausea or vomiting. 01/21/24   Anner Alm ORN, MD  Probiotic Product (CULTURELLE PROBIOTICS) CHEW Chew 1 tablet by mouth daily.    [provider]  rosuvastatin  (CRESTOR ) 20 MG tablet TAKE 1 TABLET(20 MG) BY MOUTH DAILY 11/29/23   Anner Alm ORN, MD  sacubitril -valsartan  (ENTRESTO ) 49-51 MG Take 1 tablet by mouth 2 (two) times daily. 01/19/24   Fairy Frames, MD  spironolactone  (ALDACTONE ) 25 MG tablet Take 0.5 tablets (12.5 mg total) by mouth daily. 01/19/24   Fairy Frames, MD  tamsulosin  (FLOMAX ) 0.4 MG CAPS capsule Take 1 capsule (0.4 mg total) by mouth daily after supper. 11/17/21   Patrcia Cough, MD  ticagrelor  (BRILINTA ) 90 MG TABS tablet Take 1 tablet (90 mg total) by mouth 2 (two) times daily. 01/19/24   Fairy Frames, MD  traMADol  (ULTRAM ) 50 MG tablet Take 1  tablet (50 mg total) by mouth every 6 (six) hours as needed for moderate pain (pain score 4-6) or severe pain (pain score 7-10). Patient not taking: Reported on 01/08/2024 10/27/23   Sonjia Held, MD     Critical care time:     CRITICAL CARE Performed by: Cough JONELLE Beals   Total critical care time: 38 minutes  Critical care time was exclusive of separately billable procedures and treating other patients.  Critical care was necessary to treat or prevent imminent or life-threatening deterioration.  Critical care was time spent personally by me on the following activities: development of treatment plan with patient and/or surrogate as well as nursing, discussions with consultants, evaluation of patient's response to treatment, examination of patient, obtaining history from patient or surrogate, ordering and performing treatments and interventions, ordering and review of laboratory studies, ordering and review of radiographic studies, pulse oximetry and re-evaluation of patient's condition.

## 2024-01-22 NOTE — Progress Notes (Addendum)
 eLink Physician-Brief Progress Note Patient Name: JUELL RADNEY DOB: 02/25/1946 MRN: [id number]   Date of Service  01/22/2024  HPI/Events of Note  Blood pressure marginal with MAP of 56, heart rate elevated at 102.  eICU Interventions  Repeat NS bolus    2153 - freq PVC's,   STE at times on the monitor, BSRN trying to get 7pm ECG to roll over in epic.  Suspected normal repolarization defects.  ECG and telemetry reviewed with evidence of repolarization injury.  Continue monitoring, last troponin downtrending, replete electrolytes with a.m. labs  2317 -persistent hypotension despite bolus.  Add norepinephrine  as needed, KCl scheduled   Intervention Category Intermediate Interventions: Hypertension - evaluation and management  Kristine Chahal 01/22/2024, 9:34 PM

## 2024-01-23 DIAGNOSIS — A419 Sepsis, unspecified organism: Secondary | ICD-10-CM | POA: Diagnosis not present

## 2024-01-23 DIAGNOSIS — K567 Ileus, unspecified: Secondary | ICD-10-CM

## 2024-01-23 DIAGNOSIS — E871 Hypo-osmolality and hyponatremia: Secondary | ICD-10-CM

## 2024-01-23 DIAGNOSIS — K6389 Other specified diseases of intestine: Secondary | ICD-10-CM

## 2024-01-23 DIAGNOSIS — K559 Vascular disorder of intestine, unspecified: Secondary | ICD-10-CM | POA: Diagnosis not present

## 2024-01-23 DIAGNOSIS — R6521 Severe sepsis with septic shock: Secondary | ICD-10-CM | POA: Diagnosis not present

## 2024-01-23 DIAGNOSIS — E876 Hypokalemia: Secondary | ICD-10-CM

## 2024-01-23 LAB — CBC
HCT: 36 % — ABNORMAL LOW (ref 39.0–52.0)
Hemoglobin: 11.6 g/dL — ABNORMAL LOW (ref 13.0–17.0)
MCH: 28.6 pg (ref 26.0–34.0)
MCHC: 32.2 g/dL (ref 30.0–36.0)
MCV: 88.9 fL (ref 80.0–100.0)
Platelets: 355 K/uL (ref 150–400)
RBC: 4.05 MIL/uL — ABNORMAL LOW (ref 4.22–5.81)
RDW: 16.6 % — ABNORMAL HIGH (ref 11.5–15.5)
WBC: 4.4 K/uL (ref 4.0–10.5)
nRBC: 0.5 % — ABNORMAL HIGH (ref 0.0–0.2)

## 2024-01-23 LAB — BASIC METABOLIC PANEL WITH GFR
Anion gap: 15 (ref 5–15)
Anion gap: 11 (ref 5–15)
BUN: 69 mg/dL — ABNORMAL HIGH (ref 8–23)
BUN: 58 mg/dL — ABNORMAL HIGH (ref 8–23)
CO2: 29 mmol/L (ref 22–32)
CO2: 26 mmol/L (ref 22–32)
Calcium: 7.8 mg/dL — ABNORMAL LOW (ref 8.9–10.3)
Calcium: 7.6 mg/dL — ABNORMAL LOW (ref 8.9–10.3)
Chloride: 91 mmol/L — ABNORMAL LOW (ref 98–111)
Chloride: 96 mmol/L — ABNORMAL LOW (ref 98–111)
Creatinine, Ser: 2.14 mg/dL — ABNORMAL HIGH (ref 0.61–1.24)
Creatinine, Ser: 1.5 mg/dL — ABNORMAL HIGH (ref 0.61–1.24)
GFR, Estimated: 31 mL/min — ABNORMAL LOW (ref >60)
GFR, Estimated: 48 mL/min — ABNORMAL LOW (ref >60)
Glucose, Bld: 137 mg/dL — ABNORMAL HIGH (ref 70–99)
Glucose, Bld: 106 mg/dL — ABNORMAL HIGH (ref 70–99)
Potassium: 2.6 mmol/L — CL (ref 3.5–5.1)
Potassium: 3 mmol/L — ABNORMAL LOW (ref 3.5–5.1)
Sodium: 135 mmol/L (ref 135–145)
Sodium: 133 mmol/L — ABNORMAL LOW (ref 135–145)

## 2024-01-23 LAB — GLUCOSE, CAPILLARY
Glucose-Capillary: 118 mg/dL — ABNORMAL HIGH (ref 70–99)
Glucose-Capillary: 151 mg/dL — ABNORMAL HIGH (ref 70–99)
Glucose-Capillary: 103 mg/dL — ABNORMAL HIGH (ref 70–99)
Glucose-Capillary: 113 mg/dL — ABNORMAL HIGH (ref 70–99)
Glucose-Capillary: 122 mg/dL — ABNORMAL HIGH (ref 70–99)
Glucose-Capillary: 125 mg/dL — ABNORMAL HIGH (ref 70–99)

## 2024-01-23 LAB — MAGNESIUM: Magnesium: 2 mg/dL (ref 1.7–2.4)

## 2024-01-23 MED ORDER — LACTATED RINGERS IV SOLN: INTRAVENOUS | Status: AC

## 2024-01-23 MED ORDER — POTASSIUM CHLORIDE 10 MEQ/100ML IV SOLN
10.0000 meq | INTRAVENOUS | Status: AC
  Administered 2024-01-23 (×4): 10 meq via INTRAVENOUS
  Filled 2024-01-23 (×4): qty 100

## 2024-01-23 MED ORDER — POTASSIUM CHLORIDE 10 MEQ/100ML IV SOLN
10.0000 meq | INTRAVENOUS | Status: AC
  Administered 2024-01-23 (×6): 10 meq via INTRAVENOUS
  Filled 2024-01-23 (×2): qty 100

## 2024-01-23 MED ORDER — ONDANSETRON HCL 4 MG/2ML IJ SOLN
4.0000 mg | Freq: Four times a day (QID) | INTRAMUSCULAR | Status: DC | PRN
  Administered 2024-01-23 – 2024-02-12 (×14): 4 mg via INTRAVENOUS
  Filled 2024-01-23 (×17): qty 2

## 2024-01-23 MED ORDER — HEPARIN SODIUM (PORCINE) 5000 UNIT/ML IJ SOLN
5000.0000 [IU] | Freq: Three times a day (TID) | INTRAMUSCULAR | Status: DC
  Administered 2024-01-23 – 2024-01-30 (×21): 5000 [IU] via SUBCUTANEOUS
  Filled 2024-01-23 (×21): qty 1

## 2024-01-23 MED ORDER — LACTATED RINGERS IV BOLUS
1000.0000 mL | Freq: Once | INTRAVENOUS | Status: AC
  Administered 2024-01-23: 1000 mL via INTRAVENOUS

## 2024-01-23 MED ORDER — HYDROMORPHONE HCL 1 MG/ML IJ SOLN
0.5000 mg | INTRAMUSCULAR | Status: DC | PRN
  Administered 2024-01-23 – 2024-01-25 (×4): 0.5 mg via INTRAVENOUS
  Filled 2024-01-23 (×4): qty 1

## 2024-01-23 NOTE — Progress Notes (Signed)
 Intestinal ischemia (HCC)  Subjective: Awake and alert.  Denies any abd pain currently  Objective: Vital signs in last 24 hours: Temp:  [97.9 F (36.6 C)-103.5 F (39.7 C)] 99.1 F (37.3 C) (08/31 0739) Pulse Rate:  [44-118] 101 (08/31 0915) Resp:  [15-37] 25 (08/31 0915) BP: (64-143)/(45-116) 118/67 (08/31 0915) SpO2:  [85 %-100 %] 100 % (08/31 0915) Weight:  [88.1 kg] 88.1 kg (08/31 0500) Last BM Date : 01/23/24  Intake/Output from previous day: 08/30 0701 - 08/31 0700 In: 4855 [I.V.:1139.6; IV Piggyback:3715.3] Out: 700 [Urine:700] Intake/Output this shift: Total I/O In: 482.6 [I.V.:302.9; IV Piggyback:179.7] Out: 100 [Urine:100]  General appearance: alert and cooperative GI: soft, distended, no TTP  Lab Results:  Results for orders placed or performed during the hospital encounter of 01/22/24 (from the past 24 hours)  Comprehensive metabolic panel     Status: Abnormal   Collection Time: 01/22/24 12:43 PM  Result Value Ref Range   Sodium 132 (L) 135 - 145 mmol/L   Potassium 4.5 3.5 - 5.1 mmol/L   Chloride 81 (L) 98 - 111 mmol/L   CO2 31 22 - 32 mmol/L   Glucose, Bld 197 (H) 70 - 99 mg/dL   BUN 64 (H) 8 - 23 mg/dL   Creatinine, Ser 7.44 (H) 0.61 - 1.24 mg/dL   Calcium  8.6 (L) 8.9 - 10.3 mg/dL   Total Protein 6.2 (L) 6.5 - 8.1 g/dL   Albumin 3.0 (L) 3.5 - 5.0 g/dL   AST 29 15 - 41 U/L   ALT 31 0 - 44 U/L   Alkaline Phosphatase 64 38 - 126 U/L   Total Bilirubin 1.4 (H) 0.0 - 1.2 mg/dL   GFR, Estimated 25 (L) >60 mL/min   Anion gap 20 (H) 5 - 15  CBC with Differential     Status: Abnormal   Collection Time: 01/22/24 12:43 PM  Result Value Ref Range   WBC 2.4 (L) 4.0 - 10.5 K/uL   RBC 4.88 4.22 - 5.81 MIL/uL   Hemoglobin 13.9 13.0 - 17.0 g/dL   HCT 56.7 60.9 - 47.9 %   MCV 88.5 80.0 - 100.0 fL   MCH 28.5 26.0 - 34.0 pg   MCHC 32.2 30.0 - 36.0 g/dL   RDW 83.0 (H) 88.4 - 84.4 %   Platelets 489 (H) 150 - 400 K/uL   nRBC 0.0 0.0 - 0.2 %   Neutrophils Relative  % 71 %   Neutro Abs 1.7 1.7 - 7.7 K/uL   Lymphocytes Relative 12 %   Lymphs Abs 0.3 (L) 0.7 - 4.0 K/uL   Monocytes Relative 12 %   Monocytes Absolute 0.3 0.1 - 1.0 K/uL   Eosinophils Relative 0 %   Eosinophils Absolute 0.0 0.0 - 0.5 K/uL   Basophils Relative 0 %   Basophils Absolute 0.0 0.0 - 0.1 K/uL   WBC Morphology See Note    RBC Morphology MORPHOLOGY UNREMARKABLE    Smear Review See Note    Metamyelocytes Relative 4 %   Myelocytes 1 %   Abs Immature Granulocytes 0.10 (H) 0.00 - 0.07 K/uL  Brain natriuretic peptide     Status: Abnormal   Collection Time: 01/22/24 12:43 PM  Result Value Ref Range   B Natriuretic Peptide 220.4 (H) 0.0 - 100.0 pg/mL  Troponin I (High Sensitivity)     Status: Abnormal   Collection Time: 01/22/24 12:43 PM  Result Value Ref Range   Troponin I (High Sensitivity) 126 (HH) <18 ng/L  Culture, blood (  routine x 2)     Status: None (Preliminary result)   Collection Time: 01/22/24 12:43 PM   Specimen: BLOOD RIGHT ARM  Result Value Ref Range   Specimen Description BLOOD RIGHT ARM    Special Requests      BOTTLES DRAWN AEROBIC AND ANAEROBIC Blood Culture adequate volume   Culture      NO GROWTH < 24 HOURS Performed at [facility], [address] [place], [place] [address]    Report Status PENDING   Procalcitonin     Status: None   Collection Time: 01/22/24 12:43 PM  Result Value Ref Range   Procalcitonin 1.23 ng/mL  Culture, blood (routine x 2)     Status: None (Preliminary result)   Collection Time: 01/22/24 12:48 PM   Specimen: BLOOD RIGHT HAND  Result Value Ref Range   Specimen Description BLOOD RIGHT HAND    Special Requests      BOTTLES DRAWN AEROBIC ONLY Blood Culture results may not be optimal due to an inadequate volume of blood received in culture bottles   Culture      NO GROWTH < 24 HOURS Performed at [facility], [address] [place], [place] [address]    Report Status PENDING   I-stat chem 8, ED     Status:  Abnormal   Collection Time: 01/22/24 12:59 PM  Result Value Ref Range   Sodium 131 (L) 135 - 145 mmol/L   Potassium 4.3 3.5 - 5.1 mmol/L   Chloride 87 (L) 98 - 111 mmol/L   BUN 65 (H) 8 - 23 mg/dL   Creatinine, Ser 7.49 (H) 0.61 - 1.24 mg/dL   Glucose, Bld 800 (H) 70 - 99 mg/dL   Calcium , Ion 0.96 (L) 1.15 - 1.40 mmol/L   TCO2 34 (H) 22 - 32 mmol/L   Hemoglobin 15.3 13.0 - 17.0 g/dL   HCT 54.9 60.9 - 47.9 %  I-Stat Lactic Acid     Status: Abnormal   Collection Time: 01/22/24  1:00 PM  Result Value Ref Range   Lactic Acid, Venous 3.4 (HH) 0.5 - 1.9 mmol/L   Comment NOTIFIED PHYSICIAN   C Difficile Quick Screen w PCR reflex     Status: None   Collection Time: 01/22/24  1:10 PM   Specimen: STOOL  Result Value Ref Range   C Diff antigen NEGATIVE NEGATIVE   C Diff toxin NEGATIVE NEGATIVE   C Diff interpretation No C. difficile detected.   MRSA Next Gen by PCR, Nasal     Status: None   Collection Time: 01/22/24  1:10 PM   Specimen: Nasal Mucosa; Nasal Swab  Result Value Ref Range   MRSA by PCR Next Gen NOT DETECTED NOT DETECTED  Troponin I (High Sensitivity)     Status: Abnormal   Collection Time: 01/22/24  2:43 PM  Result Value Ref Range   Troponin I (High Sensitivity) 108 (HH) <18 ng/L  I-Stat Lactic Acid     Status: None   Collection Time: 01/22/24  3:00 PM  Result Value Ref Range   Lactic Acid, Venous 1.9 0.5 - 1.9 mmol/L  Glucose, capillary     Status: Abnormal   Collection Time: 01/22/24  4:43 PM  Result Value Ref Range   Glucose-Capillary 187 (H) 70 - 99 mg/dL  Urinalysis, w/ Reflex to Culture (Infection Suspected) -Urine, Clean Catch     Status: Abnormal   Collection Time: 01/22/24  5:00 PM  Result Value Ref Range  Specimen Source URINE, CLEAN CATCH    Color, Urine AMBER (A) YELLOW   APPearance HAZY (A) CLEAR   Specific Gravity, Urine 1.020 1.005 - 1.030   pH 5.0 5.0 - 8.0   Glucose, UA NEGATIVE NEGATIVE mg/dL   Hgb urine dipstick NEGATIVE NEGATIVE   Bilirubin  Urine NEGATIVE NEGATIVE   Ketones, ur NEGATIVE NEGATIVE mg/dL   Protein, ur NEGATIVE NEGATIVE mg/dL   Nitrite NEGATIVE NEGATIVE   Leukocytes,Ua NEGATIVE NEGATIVE   RBC / HPF 0-5 0 - 5 RBC/hpf   WBC, UA 0-5 0 - 5 WBC/hpf   Bacteria, UA RARE (A) NONE SEEN   Squamous Epithelial / HPF 0-5 0 - 5 /HPF   Mucus PRESENT    Hyaline Casts, UA PRESENT   Glucose, capillary     Status: Abnormal   Collection Time: 01/22/24  8:18 PM  Result Value Ref Range   Glucose-Capillary 168 (H) 70 - 99 mg/dL  Basic metabolic panel     Status: Abnormal   Collection Time: 01/22/24  9:59 PM  Result Value Ref Range   Sodium 135 135 - 145 mmol/L   Potassium 2.6 (LL) 3.5 - 5.1 mmol/L   Chloride 91 (L) 98 - 111 mmol/L   CO2 29 22 - 32 mmol/L   Glucose, Bld 137 (H) 70 - 99 mg/dL   BUN 69 (H) 8 - 23 mg/dL   Creatinine, Ser 7.85 (H) 0.61 - 1.24 mg/dL   Calcium  7.8 (L) 8.9 - 10.3 mg/dL   GFR, Estimated 31 (L) >60 mL/min   Anion gap 15 5 - 15  Magnesium      Status: None   Collection Time: 01/22/24  9:59 PM  Result Value Ref Range   Magnesium  2.1 1.7 - 2.4 mg/dL  Glucose, capillary     Status: None   Collection Time: 01/22/24 11:40 PM  Result Value Ref Range   Glucose-Capillary 90 70 - 99 mg/dL  Glucose, capillary     Status: Abnormal   Collection Time: 01/23/24  3:18 AM  Result Value Ref Range   Glucose-Capillary 118 (H) 70 - 99 mg/dL  Glucose, capillary     Status: Abnormal   Collection Time: 01/23/24  7:38 AM  Result Value Ref Range   Glucose-Capillary 151 (H) 70 - 99 mg/dL     Studies/Results Radiology     MEDS, Scheduled  Chlorhexidine  Gluconate Cloth  6 each Topical Daily   insulin  aspart  0-9 Units Subcutaneous Q4H     Assessment: Intestinal ischemia (HCC)   Plan: Cont NPO Cont IV Zosyn  Will recheck wbc today No clear indication for surgery at this time Medical management per CCM   LOS: 1 day    Bernarda JAYSON Ned, MD [facility]  Patient's medical decision  making was moderate    01/23/2024 10:40 AM

## 2024-01-23 NOTE — Plan of Care (Signed)
  Problem: Education: Goal: Knowledge of General Education information will improve Description: Including pain rating scale, medication(s)/side effects and non-pharmacologic comfort measures Outcome: Progressing   Problem: Clinical Measurements: Goal: Diagnostic test results will improve Outcome: Progressing Goal: Respiratory complications will improve Outcome: Progressing Goal: Cardiovascular complication will be avoided Outcome: Progressing   Problem: Activity: Goal: Risk for activity intolerance will decrease Outcome: Progressing   

## 2024-01-23 NOTE — Progress Notes (Signed)
   Rounding Note    Patient Name: Chase Combs Date of Encounter: 01/23/2024  [facility] Cardiologist: Alm Clay, MD   Subjective   No acute events overnight.  Patient reports feeling poorly this morning.  No focal symptoms.  Vital Signs    Vitals:   01/23/24 0630 01/23/24 0645 01/23/24 0700 01/23/24 0739  BP: 119/68 113/69 (!) 101/57   Pulse: (!) 106 90 97   Resp: (!) 25 (!) 25 (!) 26   Temp:    99.1 F (37.3 C)  TempSrc:    Oral  SpO2: 98% 95% 100%   Weight:        Intake/Output Summary (Last 24 hours) at 01/23/2024 0810 Last data filed at 01/23/2024 0741 Gross per 24 hour  Intake 4889.73 ml  Output 800 ml  Net 4089.73 ml      01/23/2024    5:00 AM 01/18/2024    5:01 AM 01/15/2024    5:00 AM  Last 3 Weights  Weight (lbs) 194 lb 3.6 oz 193 lb 8 oz 212 lb 10.1 oz  Weight (kg) 88.1 kg 87.771 kg 96.45 kg       Physical Exam   GEN: No acute distress.   Cardiac: RRR, no murmurs, rubs, or gallops.  Abdomen: Distended Psych: Normal affect   Assessment & Plan    #Coronary artery disease #Recent PCI to the proximal LAD Continue IV cangrelor  given inability to take p.o. with very recent Synergy stent to the proximal LAD  #Possible ischemic colitis Management per MICU team  Cardiology will follow along.    Ole T. Cindie, MD, [facility], [facility] Cardiac Electrophysiology

## 2024-01-23 NOTE — H&P (Signed)
 NAME:  Chase Combs, MRN:  [id number], DOB:  12-Jul-1945, LOS: 1 ADMISSION DATE:  01/22/2024, CONSULTATION DATE:  01/22/24 REFERRING MD:  EDP, CHIEF COMPLAINT:  abd pain   History of Present Illness:  78 year old man w/ hx of ischemic cardiomyopathy and very recent LAD stent placement for NSTEMI discharged 8/27 (no d/c summary so unclear) with progressive nausea vomiting with CT images concerning for ischemic colitis.  Feeling weak and unwell since discharge 3 days ago.  Progressive nausea and vomiting not keeping down his antiplatelet medicines.  CT images reveal intestinal air in the wall of the intestine as well as concerning findings of ileus.  His lactate was elevated.  He was volume resuscitated.  Remained hypotensive.  Placed on broad-spectrum antibiotics he received vancomycin  Zosyn  doxycycline and Flagyl .  Labs notable for acute renal failure.  Anion gap metabolic acidosis.  Leukopenia, lymphopenia.  Pertinent  Medical History  CAD, CHF  Significant Hospital Events: Including procedures, antibiotic start and stop dates in addition to other pertinent events   8/30 admitted with pneumatosis intestinalis/ileus.  General surgery recommend conservative management  Interim History / Subjective:  Patient continued to complain of abdominal pain and distention, associated with nausea Had a few liquid bowel movements last night Requiring Levophed  currently at 9 mics  Objective    Blood pressure (!) 101/57, pulse 97, temperature 99.1 F (37.3 C), temperature source Oral, resp. rate (!) 26, weight 88.1 kg, SpO2 100%.        Intake/Output Summary (Last 24 hours) at 01/23/2024 0846 Last data filed at 01/23/2024 0741 Gross per 24 hour  Intake 4889.73 ml  Output 800 ml  Net 4089.73 ml   Filed Weights   01/23/24 0500  Weight: 88.1 kg    Examination: General: Acute on chronically ill-appearing elderly male, lying on the bed HEENT: [place]/AT, eyes anicteric.  Dry mucus membranes Neuro:  Sleepy, opens eyes with vocal stimuli, following simple commands Chest: Coarse breath sounds, no wheezes or rhonchi Heart: Regular rate and rhythm, no murmurs or gallops Abdomen: Soft, distended, mildly tender all over, sluggish bowel sounds present  Labs and images reviewed  Patient Lines/Drains/Airways Status     Active Line/Drains/Airways     Name Placement date Placement time Site Days   Peripheral IV 01/22/24 20 G Left Antecubital 01/22/24  1227  Antecubital  1   Peripheral IV 01/22/24 20 G Right Antecubital 01/22/24  1315  Antecubital  1   Peripheral IV 01/22/24 20 G Distal;Left;Posterior Forearm 01/22/24  2200  Forearm  1   Flatus Tube/Pouch 01/22/24  1700  --  1   External Urinary Catheter 01/22/24  1700  --  1         Resolved problem list   Assessment and Plan  Severe sepsis with septic shock due to intra-abdominal source, POA Intestinal pneumatosis concerning for ischemic colitis: Severe arthrosclerotic disease as evidenced by severe CAD Severe ileus Lactic acidosis, improved Patient continued to complain of nausea and abdominal pain Had a few liquidy bowel movements yesterday General Surgery is following, recommend conservative management Monitor electrolytes and supplement to keep potassium 4 - 5 and magnesium  about 2 Continue IV Zosyn  to cover for intra-abdominal source Stop Flagyl  as C. difficile testing is negative Lactate has cleared He has sluggish bowel sounds Continue IV fluid resuscitation as patient looks dehydrated  Previous pneumonia: Significant improved on repeat imaging On antibiotics  AKI due to septic ATN  Hypokalemia/hyponatremia Serum creatinine is downtrending Baseline serum creatinine is  0.7, currently at 2.1 Continue IV fluid resuscitation Hold Entresto , Lasix , Toprol  and Aldactone  Avoid nephrotoxic agent Monitor intake and output Continue aggressive electrolyte replacement  Coronary artery disease with recent LAD drug-eluting  stent Chronic systolic CHF Patient is not able to tolerate oral antiplatelet agents in the setting of ileus/ischemic colitis Continue IV cangrelor  infusion Appreciate cardiology input  Leukopenia: Likely sequela of severe sepsis Continue supportive care   The patient is critically ill due to severe sepsis with septic shock due to intra-abdominal source/pneumatosis intestinalis/possible bowel ischemia.  Critical care was necessary to treat or prevent imminent or life-threatening deterioration.  Critical care was time spent personally by me on the following activities: development of treatment plan with patient and/or surrogate as well as nursing, discussions with consultants, evaluation of patient's response to treatment, examination of patient, obtaining history from patient or surrogate, ordering and performing treatments and interventions, ordering and review of laboratory studies, ordering and review of radiographic studies, pulse oximetry, re-evaluation of patient's condition and participation in multidisciplinary rounds.   During this encounter critical care time was devoted to patient care services described in this note for 40 minutes.     Valinda Novas, MD [facility] See Amion for pager If no response to pager, please call [phone] until 7pm After 7pm, Please call E-link [phone]

## 2024-01-24 ENCOUNTER — Inpatient Hospital Stay (HOSPITAL_COMMUNITY)

## 2024-01-24 DIAGNOSIS — R918 Other nonspecific abnormal finding of lung field: Secondary | ICD-10-CM | POA: Diagnosis not present

## 2024-01-24 DIAGNOSIS — R197 Diarrhea, unspecified: Secondary | ICD-10-CM | POA: Diagnosis not present

## 2024-01-24 DIAGNOSIS — I4891 Unspecified atrial fibrillation: Secondary | ICD-10-CM

## 2024-01-24 DIAGNOSIS — J90 Pleural effusion, not elsewhere classified: Secondary | ICD-10-CM | POA: Diagnosis not present

## 2024-01-24 DIAGNOSIS — K559 Vascular disorder of intestine, unspecified: Secondary | ICD-10-CM | POA: Diagnosis not present

## 2024-01-24 DIAGNOSIS — K6389 Other specified diseases of intestine: Secondary | ICD-10-CM | POA: Diagnosis not present

## 2024-01-24 DIAGNOSIS — Z4682 Encounter for fitting and adjustment of non-vascular catheter: Secondary | ICD-10-CM | POA: Diagnosis not present

## 2024-01-24 DIAGNOSIS — Z452 Encounter for adjustment and management of vascular access device: Secondary | ICD-10-CM | POA: Diagnosis not present

## 2024-01-24 DIAGNOSIS — R0989 Other specified symptoms and signs involving the circulatory and respiratory systems: Secondary | ICD-10-CM | POA: Diagnosis not present

## 2024-01-24 LAB — LACTIC ACID, PLASMA
Lactic Acid, Venous: 1.1 mmol/L (ref 0.5–1.9)
Lactic Acid, Venous: 1.1 mmol/L (ref 0.5–1.9)

## 2024-01-24 LAB — BASIC METABOLIC PANEL WITH GFR
Anion gap: 14 (ref 5–15)
Anion gap: 12 (ref 5–15)
BUN: 56 mg/dL — ABNORMAL HIGH (ref 8–23)
BUN: 61 mg/dL — ABNORMAL HIGH (ref 8–23)
CO2: 26 mmol/L (ref 22–32)
CO2: 23 mmol/L (ref 22–32)
Calcium: 8.3 mg/dL — ABNORMAL LOW (ref 8.9–10.3)
Calcium: 7.6 mg/dL — ABNORMAL LOW (ref 8.9–10.3)
Chloride: 98 mmol/L (ref 98–111)
Chloride: 98 mmol/L (ref 98–111)
Creatinine, Ser: 1.46 mg/dL — ABNORMAL HIGH (ref 0.61–1.24)
Creatinine, Ser: 1.94 mg/dL — ABNORMAL HIGH (ref 0.61–1.24)
GFR, Estimated: 50 mL/min — ABNORMAL LOW
GFR, Estimated: 35 mL/min — ABNORMAL LOW (ref >60)
Glucose, Bld: 127 mg/dL — ABNORMAL HIGH (ref 70–99)
Glucose, Bld: 104 mg/dL — ABNORMAL HIGH (ref 70–99)
Potassium: 3.3 mmol/L — ABNORMAL LOW (ref 3.5–5.1)
Potassium: 3.2 mmol/L — ABNORMAL LOW (ref 3.5–5.1)
Sodium: 138 mmol/L (ref 135–145)
Sodium: 133 mmol/L — ABNORMAL LOW (ref 135–145)

## 2024-01-24 LAB — GLUCOSE, CAPILLARY
Glucose-Capillary: 113 mg/dL — ABNORMAL HIGH (ref 70–99)
Glucose-Capillary: 112 mg/dL — ABNORMAL HIGH (ref 70–99)
Glucose-Capillary: 155 mg/dL — ABNORMAL HIGH (ref 70–99)
Glucose-Capillary: 144 mg/dL — ABNORMAL HIGH (ref 70–99)
Glucose-Capillary: 104 mg/dL — ABNORMAL HIGH (ref 70–99)
Glucose-Capillary: 96 mg/dL (ref 70–99)

## 2024-01-24 LAB — CBC
HCT: 35.4 % — ABNORMAL LOW (ref 39.0–52.0)
Hemoglobin: 11.2 g/dL — ABNORMAL LOW (ref 13.0–17.0)
MCH: 28.5 pg (ref 26.0–34.0)
MCHC: 31.6 g/dL (ref 30.0–36.0)
MCV: 90.1 fL (ref 80.0–100.0)
Platelets: 344 K/uL (ref 150–400)
RBC: 3.93 MIL/uL — ABNORMAL LOW (ref 4.22–5.81)
RDW: 16.5 % — ABNORMAL HIGH (ref 11.5–15.5)
WBC: 4.8 K/uL (ref 4.0–10.5)
nRBC: 0 % (ref 0.0–0.2)

## 2024-01-24 MED ORDER — POTASSIUM CHLORIDE 10 MEQ/50ML IV SOLN
10.0000 meq | INTRAVENOUS | Status: AC
  Administered 2024-01-24 – 2024-01-25 (×6): 10 meq via INTRAVENOUS
  Filled 2024-01-24 (×6): qty 50

## 2024-01-24 MED ORDER — AMIODARONE HCL IN DEXTROSE 360-4.14 MG/200ML-% IV SOLN
60.0000 mg/h | INTRAVENOUS | Status: DC
  Administered 2024-01-24 (×2): 60 mg/h via INTRAVENOUS
  Filled 2024-01-24 (×2): qty 200

## 2024-01-24 MED ORDER — MAGNESIUM SULFATE 4 GM/100ML IV SOLN
4.0000 g | Freq: Once | INTRAVENOUS | Status: AC
  Administered 2024-01-24: 4 g via INTRAVENOUS
  Filled 2024-01-24: qty 100

## 2024-01-24 MED ORDER — POTASSIUM CHLORIDE 10 MEQ/100ML IV SOLN
10.0000 meq | INTRAVENOUS | Status: AC
  Administered 2024-01-24 (×9): 10 meq via INTRAVENOUS
  Filled 2024-01-24 (×7): qty 100

## 2024-01-24 MED ORDER — SODIUM CHLORIDE 0.9% FLUSH
10.0000 mL | INTRAVENOUS | Status: DC | PRN
  Administered 2024-01-24 – 2024-02-01 (×3): 10 mL

## 2024-01-24 MED ORDER — SODIUM CHLORIDE 0.9% FLUSH
10.0000 mL | Freq: Two times a day (BID) | INTRAVENOUS | Status: DC
  Administered 2024-01-24 (×2): 10 mL
  Administered 2024-01-25: 40 mL
  Administered 2024-01-25: 10 mL
  Administered 2024-01-26: 40 mL
  Administered 2024-01-26 – 2024-01-27 (×2): 10 mL
  Administered 2024-01-27: 30 mL
  Administered 2024-01-28 – 2024-02-05 (×17): 10 mL
  Administered 2024-02-06: 20 mL
  Administered 2024-02-06 – 2024-02-11 (×10): 10 mL
  Administered 2024-02-11: 20 mL
  Administered 2024-02-12 (×2): 10 mL
  Administered 2024-02-13 – 2024-02-14 (×4): 20 mL
  Administered 2024-02-15 – 2024-02-17 (×5): 10 mL
  Administered 2024-02-17: 20 mL
  Administered 2024-02-18: 30 mL
  Administered 2024-02-18: 20 mL
  Administered 2024-02-19 – 2024-02-21 (×4): 10 mL
  Administered 2024-02-21: 20 mL
  Administered 2024-02-22: 30 mL
  Administered 2024-02-22 – 2024-02-24 (×4): 10 mL

## 2024-01-24 MED ORDER — AMIODARONE LOAD VIA INFUSION
150.0000 mg | Freq: Once | INTRAVENOUS | Status: AC
  Administered 2024-01-24: 150 mg via INTRAVENOUS
  Filled 2024-01-24: qty 83.34

## 2024-01-24 MED ORDER — AMIODARONE HCL IN DEXTROSE 360-4.14 MG/200ML-% IV SOLN
30.0000 mg/h | INTRAVENOUS | Status: DC
  Administered 2024-01-24 – 2024-02-01 (×15): 30 mg/h via INTRAVENOUS
  Filled 2024-01-24 (×13): qty 200

## 2024-01-24 NOTE — Plan of Care (Signed)
  Problem: Clinical Measurements: Goal: Ability to maintain clinical measurements within normal limits will improve Outcome: Progressing   Problem: Clinical Measurements: Goal: Will remain free from infection Outcome: Progressing   Problem: Clinical Measurements: Goal: Diagnostic test results will improve Outcome: Progressing   Problem: Clinical Measurements: Goal: Respiratory complications will improve Outcome: Progressing   Problem: Clinical Measurements: Goal: Cardiovascular complication will be avoided Outcome: Progressing   Problem: Activity: Goal: Risk for activity intolerance will decrease Outcome: Progressing   Problem: Elimination: Goal: Will not experience complications related to bowel motility Outcome: Progressing   Problem: Coping: Goal: Level of anxiety will decrease Outcome: Progressing   Problem: Pain Managment: Goal: General experience of comfort will improve and/or be controlled Outcome: Progressing   Problem: Skin Integrity: Goal: Risk for impaired skin integrity will decrease Outcome: Progressing   Problem: Tissue Perfusion: Goal: Adequacy of tissue perfusion will improve Outcome: Progressing    Plan of care, assessment, monitoring, treatment, and intervention (s) ongoing, see MAR see flowsheet

## 2024-01-24 NOTE — Progress Notes (Signed)
 Intestinal ischemia (HCC)  Subjective: Awake and alert. Having diffuse abd pain  Objective: Vital signs in last 24 hours: Temp:  [98.3 F (36.8 C)-99.7 F (37.6 C)] 98.3 F (36.8 C) (09/01 0727) Pulse Rate:  [60-119] 65 (09/01 0930) Resp:  [15-32] 21 (09/01 0930) BP: (70-142)/(42-131) 95/57 (09/01 0930) SpO2:  [84 %-100 %] 95 % (09/01 0930) Weight:  [92.1 kg] 92.1 kg (09/01 0423) Last BM Date : 01/24/24  Intake/Output from previous day: 08/31 0701 - 09/01 0700 In: 4329.9 [I.V.:2802.5; IV Piggyback:1527.4] Out: 1050 [Urine:1050] Intake/Output this shift: Total I/O In: 364.1 [I.V.:351.7; IV Piggyback:12.3] Out: -   General appearance: alert and cooperative GI: soft, more distended, no TTP  Lab Results:  Results for orders placed or performed during the hospital encounter of 01/22/24 (from the past 24 hours)  Basic metabolic panel     Status: Abnormal   Collection Time: 01/23/24 11:11 AM  Result Value Ref Range   Sodium 133 (L) 135 - 145 mmol/L   Potassium 3.0 (L) 3.5 - 5.1 mmol/L   Chloride 96 (L) 98 - 111 mmol/L   CO2 26 22 - 32 mmol/L   Glucose, Bld 106 (H) 70 - 99 mg/dL   BUN 58 (H) 8 - 23 mg/dL   Creatinine, Ser 8.49 (H) 0.61 - 1.24 mg/dL   Calcium  7.6 (L) 8.9 - 10.3 mg/dL   GFR, Estimated 48 (L) >60 mL/min   Anion gap 11 5 - 15  Magnesium      Status: None   Collection Time: 01/23/24 11:11 AM  Result Value Ref Range   Magnesium  2.0 1.7 - 2.4 mg/dL  CBC     Status: Abnormal   Collection Time: 01/23/24 11:11 AM  Result Value Ref Range   WBC 4.4 4.0 - 10.5 K/uL   RBC 4.05 (L) 4.22 - 5.81 MIL/uL   Hemoglobin 11.6 (L) 13.0 - 17.0 g/dL   HCT 63.9 (L) 60.9 - 47.9 %   MCV 88.9 80.0 - 100.0 fL   MCH 28.6 26.0 - 34.0 pg   MCHC 32.2 30.0 - 36.0 g/dL   RDW 83.3 (H) 88.4 - 84.4 %   Platelets 355 150 - 400 K/uL   nRBC 0.5 (H) 0.0 - 0.2 %  Glucose, capillary     Status: Abnormal   Collection Time: 01/23/24 11:22 AM  Result Value Ref Range   Glucose-Capillary 103  (H) 70 - 99 mg/dL  Glucose, capillary     Status: Abnormal   Collection Time: 01/23/24  3:23 PM  Result Value Ref Range   Glucose-Capillary 113 (H) 70 - 99 mg/dL  Glucose, capillary     Status: Abnormal   Collection Time: 01/23/24  7:35 PM  Result Value Ref Range   Glucose-Capillary 122 (H) 70 - 99 mg/dL  Glucose, capillary     Status: Abnormal   Collection Time: 01/23/24 11:19 PM  Result Value Ref Range   Glucose-Capillary 125 (H) 70 - 99 mg/dL  Glucose, capillary     Status: Abnormal   Collection Time: 01/24/24  3:23 AM  Result Value Ref Range   Glucose-Capillary 113 (H) 70 - 99 mg/dL  CBC     Status: Abnormal   Collection Time: 01/24/24  3:36 AM  Result Value Ref Range   WBC 4.8 4.0 - 10.5 K/uL   RBC 3.93 (L) 4.22 - 5.81 MIL/uL   Hemoglobin 11.2 (L) 13.0 - 17.0 g/dL   HCT 64.5 (L) 60.9 - 47.9 %   MCV 90.1 80.0 - 100.0  fL   MCH 28.5 26.0 - 34.0 pg   MCHC 31.6 30.0 - 36.0 g/dL   RDW 83.4 (H) 88.4 - 84.4 %   Platelets 344 150 - 400 K/uL   nRBC 0.0 0.0 - 0.2 %  Glucose, capillary     Status: Abnormal   Collection Time: 01/24/24  7:25 AM  Result Value Ref Range   Glucose-Capillary 112 (H) 70 - 99 mg/dL  Basic metabolic panel     Status: Abnormal   Collection Time: 01/24/24  7:55 AM  Result Value Ref Range   Sodium 138 135 - 145 mmol/L   Potassium 3.3 (L) 3.5 - 5.1 mmol/L   Chloride 98 98 - 111 mmol/L   CO2 26 22 - 32 mmol/L   Glucose, Bld 127 (H) 70 - 99 mg/dL   BUN 56 (H) 8 - 23 mg/dL   Creatinine, Ser 8.53 (H) 0.61 - 1.24 mg/dL   Calcium  8.3 (L) 8.9 - 10.3 mg/dL   GFR, Estimated 50 (L) >60 mL/min   Anion gap 14 5 - 15     Studies/Results Radiology     MEDS, Scheduled  Chlorhexidine  Gluconate Cloth  6 each Topical Daily   heparin  injection (subcutaneous)  5,000 Units Subcutaneous Q8H   insulin  aspart  0-9 Units Subcutaneous Q4H     Assessment: Intestinal ischemia (HCC)   Plan: Cont NPO, may benefit from gastric decompression.  Rectal tube ok as  well Cont IV Zosyn  No clear indication for surgery at this time, appears to be adynamic ileus of the colon Medical management per CCM   LOS: 2 days    Chase Combs Ned, MD [facility]  Patient's medical decision making was moderate    01/24/2024 9:53 AM

## 2024-01-24 NOTE — Progress Notes (Signed)
 eLink Physician-Brief Progress Note Patient Name: Chase Combs DOB: [birth year] MRN: [id number]   Date of Service  01/24/2024  HPI/Events of Note  K 3.2  eICU Interventions  Kcl     Intervention Category Minor Interventions: Electrolytes abnormality - evaluation and management  Whitney Hillegass 01/24/2024, 11:37 PM

## 2024-01-24 NOTE — Progress Notes (Signed)
 Peripherally Inserted Central Catheter Placement  The IV Nurse has discussed with the patient and/or persons authorized to consent for the patient, the purpose of this procedure and the potential benefits and risks involved with this procedure.  The benefits include less needle sticks, lab draws from the catheter, and the patient may be discharged home with the catheter. Risks include, but not limited to, infection, bleeding, blood clot (thrombus formation), and puncture of an artery; nerve damage and irregular heartbeat and possibility to perform a PICC exchange if needed/ordered by physician.  Alternatives to this procedure were also discussed.  Bard Power PICC patient education guide, fact sheet on infection prevention and patient information card has been provided to patient /or left at bedside.    PICC Placement Documentation  PICC Triple Lumen 01/24/24 Left Basilic 46 cm 0 cm (Active)  Indication for Insertion or Continuance of Line Vasoactive infusions 01/24/24 1208  Exposed Catheter (cm) 0 cm 01/24/24 1208  Site Assessment Clean, Dry, Intact 01/24/24 1208  Lumen #1 Status Flushed;Saline locked;Blood return noted 01/24/24 1208  Lumen #2 Status Flushed;Saline locked;Blood return noted 01/24/24 1208  Lumen #3 Status Flushed;Saline locked;Blood return noted 01/24/24 1208  Dressing Type Transparent;Securing device 01/24/24 1208  Dressing Status Antimicrobial disc/dressing in place;Clean, Dry, Intact 01/24/24 1208  Line Care Connections checked and tightened 01/24/24 1208  Line Adjustment (NICU/IV Team Only) No 01/24/24 1208  Dressing Intervention New dressing;Adhesive placed at insertion site (IV team only) 01/24/24 1208  Dressing Change Due 01/31/24 01/24/24 1208    Consent signed by daughter at bedside per patient request   Renaee Neville Skillern 01/24/2024, 12:09 PM

## 2024-01-24 NOTE — Progress Notes (Signed)
 NAME:  Chase Combs, MRN:  [id number], DOB:  04/18/1946, LOS: 2 ADMISSION DATE:  01/22/2024, CONSULTATION DATE:  01/24/2024 REFERRING MD:  ED, CHIEF COMPLAINT:  abdominal pain   History of Present Illness:  This is a pleasant 78 yo man w/ hx of ischemic cardiomyopathy and LAD stent placement for NSTEMI discharged on 8/27 with progressive n/v with CT imaging concerning for ischemic colitis.   Patient was feeling unwell and weak since discharge 3 days ago. Nausea and vomiting were worsening progressively and he was unable to hold down his antiplatelet medicines. Presented to ED where CT images showed bowel wall gas and findings c/f ileus. Patient had elevated lactate, and was hypotensive, received volume resuscitation, placed on broad spectrum abx. Also found to have acute renal failure, anion gap metabolic acidosis, leukopenia and lymphopenia.  Pertinent  Medical History  CAD, CHF  Significant Hospital Events: Including procedures, antibiotic start and stop dates in addition to other pertinent events   8/30 admitted with pneumatosis intestinalis/ileus. Gen surg consulted, recommend conservative management  Interim History / Subjective:  Continues to have abdominal pain and distention with nausea Continues to have liquid bowel movements.  Requiring levophed  to 8 mics HR irregular- sinus, PVCs, Afib, possible block. Cardiology feels it is related to inflammation/infection  Objective    Blood pressure (!) 108/56, pulse (!) 102, temperature 98.3 F (36.8 C), temperature source Oral, resp. rate (!) 22, weight 92.1 kg, SpO2 93%.        Intake/Output Summary (Last 24 hours) at 01/24/2024 0835 Last data filed at 01/24/2024 0800 Gross per 24 hour  Intake 4190.46 ml  Output 950 ml  Net 3240.46 ml   Filed Weights   01/23/24 0500 01/24/24 0423  Weight: 88.1 kg 92.1 kg    Examination: General: Elderly, ill appearing HENT: PERRLA, EOMI. Moist mucous membranes Lungs: CTAB, no increased  WOB Cardiovascular: Tachycardic, regular. No m/r/g Abdomen: Distended, tender, no guarding. Loops of bowel palpable through abdominal wall. Bowel sounds sluggish Extremities: Warm, well perfused.   Resolved problem list  Lactic Acidosis Assessment and Plan  Severe sepsis with septic shock due to intra-abdominal source, POA Intestinal pneumatosis with ischemic vs infectious colitis: Severe Atherosclerotic disease as evidenced by severe CAD Severe Ileus General surgery following, continue to recommend conservative management Continue NPO Continue aggressive electrolyte management, goal K 4-5, mag 2 IV zosyn  for intra-abdominal infection coverage Continue IV fluid resuscitation Repeat lactate Antinausea medicine as needed, currently on zofran , but will need to change given prolonged QT Place rectal tube if lactate is normal Consider NG tube if distension on KUB or worsening nausea Surgical intervention as a last resort given high risk of mortality  Pneumonia Continue above abx  AKI due to Septic ATN Electrolyte derangement Continues to improve, baseline 0.7, currently 1.5 Continue IV fluids Hold Entresto , lasix , toprol , and aldactone  Avoid nephrotoxic agents Strict Is and Os Electrolyte replacement as above  CAD w/ LAD drug eluting stent Chronic systolic CHF 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  Labs   CBC: Recent Labs  Lab 01/18/24 0615 01/22/24 1243 01/22/24 1259 01/23/24 1111 01/24/24 0336  WBC 10.3 2.4*  --  4.4 4.8  NEUTROABS  --  1.7  --   --   --   HGB 12.3* 13.9 15.3 11.6* 11.2*  HCT 37.9* 43.2 45.0 36.0* 35.4*  MCV 89.0 88.5  --  88.9 90.1  PLT 406* 489*  --  355 344    Basic Metabolic Panel: Recent Labs  Lab 01/18/24 0615 01/19/24 1129  01/22/24 1243 01/22/24 1259 01/22/24 2159 01/23/24 1111  NA 132* 133* 132* 131* 135 133*  K 3.2* 3.5 4.5 4.3 2.6* 3.0*  CL 88* 88* 81* 87* 91* 96*  CO2 33* 34* 31  --  29 26  GLUCOSE 169* 169* 197* 199* 137* 106*  BUN 24* 21 64* 65* 69* 58*  CREATININE 0.78 0.76 2.55* 2.50* 2.14* 1.50*  CALCIUM  8.4* 8.5* 8.6*  --  7.8* 7.6*  MG  --  2.2  --   --  2.1 2.0   GFR: Estimated Creatinine Clearance: 46 mL/min (A) (by C-G formula based on SCr of 1.5 mg/dL (H)). Recent Labs  Lab 01/18/24 0615 01/22/24 1243 01/22/24 1300 01/22/24 1500 01/23/24 1111 01/24/24 0336  PROCALCITON  --  1.23  --   --   --   --   WBC 10.3 2.4*  --   --  4.4 4.8  LATICACIDVEN  --   --  3.4* 1.9  --   --     Liver Function Tests: Recent Labs  Lab 01/22/24 1243  AST 29  ALT 31  ALKPHOS 64  BILITOT 1.4*  PROT 6.2*  ALBUMIN 3.0*   No results for input(s): LIPASE, AMYLASE in the last 168 hours. No results for input(s): AMMONIA in the last 168 hours.  ABG    Component Value Date/Time   PHART 7.539 (H) 01/17/2024 1132   PCO2ART 46.9 01/17/2024 1132   PO2ART 61 (L) 01/17/2024 1132   HCO3 40.0 (H) 01/17/2024 1132   TCO2 34 (H) 01/22/2024 1259   ACIDBASEDEF 1.0 12/08/2006 1859   O2SAT 93 01/17/2024 1132     Coagulation Profile: No results for input(s): INR, PROTIME in the last 168 hours.  Cardiac Enzymes: No results for input(s): CKTOTAL, CKMB, CKMBINDEX, TROPONINI in the last 168 hours.  HbA1C: Hgb A1c MFr Bld  Date/Time Value Ref Range Status  09/22/2023 08:46 AM 6.4 (H) 4.8 - 5.6 % Final    Comment:    (NOTE) Pre diabetes:          5.7%-6.4%  Diabetes:              >6.4%  Glycemic control for   <7.0% adults with diabetes   03/01/2020 09:31 AM 6.9 (H) 4.8 - 5.6 % Final    Comment:             Prediabetes: 5.7 - 6.4          Diabetes: >6.4          Glycemic control for adults with diabetes: <7.0     CBG: Recent Labs  Lab 01/23/24 1523 01/23/24 1935  01/23/24 2319 01/24/24 0323 01/24/24 0725  GLUCAP 113* 122* 125* 113* 112*    Review of Systems:   Abdominal pain, overall malaise. Nausea  Past Medical History:  He,  has a past medical history of Abdominal hernia, Arthritis, Benign localized prostatic hyperplasia with lower urinary tract symptoms (LUTS), CAD (coronary artery disease) (05/2004), Dyslipidemia, Essential hypertension, Heart murmur, History of COVID-19 (05/2020), History of ST elevation myocardial infarction (STEMI) (02/16/2009), History of urinary retention, 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(HCC) (04/2021), OSA (obstructive sleep apnea), PONV (postoperative nausea and vomiting), Pre-diabetes, and S/p bare metal coronary artery stent.   Surgical History:   Past Surgical History:  Procedure Laterality Date   CARDIAC CATHETERIZATION  05/27/2004  outpatient by dr burnard [facility] cardiology) for positive stress test;  80% stensis pRI   CORONARY ANGIOPLASTY WITH STENT PLACEMENT  06/18/2004   @[facility]  by Dr Lavon;  PCI w/ BMS to pRI  (CoStar study stent - 2.5 x 16)   CORONARY ANGIOPLASTY WITH STENT PLACEMENT  02/16/2009   @[facility]  by dr verlin;   Inferior STEMI:  PCI w/ thrombectomy total occluded RCA, BMS x1 to pRCA (Vision BMS 3.5 x 23 -> 4.0 mm), residual 40-50% pLAD, ef 45-50%   CORONARY PRESSURE/FFR STUDY N/A 01/17/2024   Procedure: CORONARY PRESSURE/FFR STUDY;  Surgeon: Mady Bruckner, MD;  Location: [facility];  Service: Cardiovascular;  Laterality: N/A;   CORONARY STENT INTERVENTION N/A 01/17/2024   Procedure: CORONARY STENT INTERVENTION;  Surgeon: Mady Bruckner, MD;  Location: [facility];  Service: Cardiovascular;  Laterality: N/A;   CORONARY ULTRASOUND/IVUS N/A 01/17/2024   Procedure: Coronary Ultrasound/IVUS;  Surgeon: Mady Bruckner, MD;  Location: [facility];  Service: Cardiovascular;  Laterality: N/A;   FLEXIBLE SIGMOIDOSCOPY N/A 10/20/2023   Procedure: KINGSTON SIDE;  Surgeon:  Legrand Victory LITTIE DOUGLAS, MD;  Location: [facility];  Service: Gastroenterology;  Laterality: N/A;   GOLD SEED IMPLANT N/A 09/19/2021   Procedure: GOLD SEED IMPLANT;  Surgeon: Cam Morene ORN, MD;  Location: [facility];  Service: Urology;  Laterality: N/A;  ONLY NEEDS 30 MIN FOR ALL   INGUINAL HERNIA REPAIR Right 09/02/1999   @[facility] ;   recurrent repair right inguinal hernia;   previous repair approx 1990s   INGUINAL HERNIA REPAIR Left    1990s   KNEE ARTHROSCOPY W/ MENISCAL REPAIR Right 12/18/2005   @[facility]    LUMBAR DISC SURGERY  1982   L5--S1   RIGHT/LEFT HEART CATH AND CORONARY ANGIOGRAPHY N/A 01/17/2024   Procedure: RIGHT/LEFT HEART CATH AND CORONARY ANGIOGRAPHY;  Surgeon: Mady Bruckner, MD;  Location: [facility];  Service: Cardiovascular;  Laterality: N/A;   SPACE OAR INSTILLATION N/A 09/19/2021   Procedure: SPACE OAR INSTILLATION;  Surgeon: Cam Morene ORN, MD;  Location: [facility];  Service: Urology;  Laterality: N/A;   TOTAL KNEE ARTHROPLASTY Left 10/01/2023   Procedure: ARTHROPLASTY, KNEE, TOTAL;  Surgeon: Kay Kemps, MD;  Location: [facility];  Service: Orthopedics;  Laterality: Left;     Social History:   reports that he has never smoked. He has never used smokeless tobacco. He reports that he does not drink alcohol and does not use drugs.   Family History:  His family history includes Lung cancer in his mother. There is no history of Colon cancer or Esophageal cancer.   Allergies Allergies  Allergen Reactions   Codeine Nausea And Vomiting   Latex Rash    Oral blisters when used at dentist     Home Medications  Prior to Admission medications   Medication Sig Start Date End Date Taking? Authorizing Provider  amiodarone  (PACERONE ) 200 MG tablet Take 1 tablet (200 mg total) by mouth daily. 01/19/24  Yes Fairy Frames, MD  aspirin  EC 81 MG tablet Take 81 mg by mouth 2 (two) times daily.   Yes [provider]  CALCIUM  PO Take  1 tablet by mouth daily.   Yes [provider]  clotrimazole-betamethasone (LOTRISONE) cream Apply 1 Application topically 2 (two) times daily as needed (skin irritation).   Yes [provider]  Cyanocobalamin  (VITAMIN B-12 PO) Take 1 tablet by mouth daily.   Yes [provider]  furosemide  (LASIX ) 20 MG tablet Take  1 tablet (20 mg total) by mouth daily. 01/19/24  Yes Croitoru, Mihai, MD  metoprolol  succinate (TOPROL -XL) 25 MG 24 hr tablet Take 1 tablet (25 mg total) by mouth daily. 01/19/24  Yes Fairy Frames, MD  Multiple Vitamins-Minerals (MULTIVITAMIN MEN 50+) TABS Take 1 tablet by mouth daily.   Yes [provider]  Probiotic Product (PROBIOTIC PO) Take 1 capsule by mouth daily.   Yes [provider]  rosuvastatin  (CRESTOR ) 20 MG tablet TAKE 1 TABLET(20 MG) BY MOUTH DAILY 11/29/23  Yes Anner Alm ORN, MD  sacubitril -valsartan  (ENTRESTO ) 49-51 MG Take 1 tablet by mouth 2 (two) times daily. 01/19/24  Yes Fairy Frames, MD  spironolactone  (ALDACTONE ) 25 MG tablet Take 0.5 tablets (12.5 mg total) by mouth daily. 01/19/24  Yes Fairy Frames, MD  tamsulosin  (FLOMAX ) 0.4 MG CAPS capsule Take 1 capsule (0.4 mg total) by mouth daily after supper. 11/17/21  Yes Patrcia Cough, MD  ticagrelor  (BRILINTA ) 90 MG TABS tablet Take 1 tablet (90 mg total) by mouth 2 (two) times daily. 01/19/24  Yes Fairy Frames, MD  nitroGLYCERIN  (NITROSTAT ) 0.4 MG SL tablet Place 1 tablet (0.4 mg total) under the tongue every 5 (five) minutes as needed for chest pain. Patient not taking: Reported on 01/23/2024 01/19/24 01/18/25  Fairy Frames, MD  ondansetron  (ZOFRAN -ODT) 4 MG disintegrating tablet Take 1 tablet (4 mg total) by mouth every 8 (eight) hours as needed for nausea or vomiting. Patient not taking: Reported on 01/23/2024 01/21/24   Anner Alm ORN, MD     Critical care time:

## 2024-01-24 NOTE — Progress Notes (Signed)
   Rounding Note    Patient Name: Chase Combs Date of Encounter: 01/24/2024  [facility] Cardiologist: Alm Clay, MD   Subjective   Went into AF last night. Feels more abdominal pain this AM. Nauseous.  Vital Signs    Vitals:   01/24/24 0700 01/24/24 0701 01/24/24 0715 01/24/24 0727  BP: (!) 142/131  (!) 96/47   Pulse: 100 98 79   Resp: 20 18 (!) 24   Temp:    98.3 F (36.8 C)  TempSrc:    Oral  SpO2: 93% 93% 94%   Weight:        Intake/Output Summary (Last 24 hours) at 01/24/2024 0733 Last data filed at 01/24/2024 0700 Gross per 24 hour  Intake 4295.18 ml  Output 1050 ml  Net 3245.18 ml      01/24/2024    4:23 AM 01/23/2024    5:00 AM 01/18/2024    5:01 AM  Last 3 Weights  Weight (lbs) 203 lb 0.7 oz 194 lb 3.6 oz 193 lb 8 oz  Weight (kg) 92.1 kg 88.1 kg 87.771 kg       Physical Exam   GEN: Uncomfortable appearing Cardiac: irregularly irregular, no murmurs, rubs, or gallops.  Abdomen: Distended Psych: Normal affect   Assessment & Plan    #Coronary artery disease #Recent PCI to the proximal LAD Continue IV cangrelor  given inability to take p.o. with very recent Synergy stent to the proximal LAD  #AF Likely 2/2 inflammation, infection Given hemodynamic impact and now requiring pressors, I would like to try to restore normal rhythm.  Start amiodarone  bolus/gtt.  #Possible ischemic colitis Management per MICU team  Cardiology will follow along.    Ole T. Cindie, MD, [facility], [facility] Cardiac Electrophysiology

## 2024-01-24 NOTE — Hospital Course (Addendum)
 9/1 Admitted for sepsis 2/2 ischemic bowel following proximal LAD stent.   HR in the 120s, had overnight irregularity possible bundle branch block but sinus  Pressure soft 96/47, up 4L from yesterday, UOP 0.82ml/kg/h  Potassium 3.0, Mag 2.0, Cr 1.5/GFR 48  Hg stable  CV- continue cangrelor  for stent, levo for pressor support  Resp- stable, on abx for bowel  GI- continue aggressive fluid and electrolytes, zofran  for nausea, zosyn , dilaudid +bowel reg  Renal- UOP stable, cr improving. Continue resus, hold nephrotoxic agents.

## 2024-01-24 NOTE — Progress Notes (Signed)
 eLink Physician-Brief Progress Note Patient Name: MILBERN DOESCHER DOB: December 20, 1945 MRN: [id number]   Date of Service  01/24/2024  HPI/Events of Note  Irregular rhythm, tachy to 140s.  eICU Interventions  ECG difficult to interpret, but appears to be sinus with BBB, stable BP. No intervention for now     Intervention Category Intermediate Interventions: Arrhythmia - evaluation and management  Rikayla Demmon 01/24/2024, 4:54 AM

## 2024-01-25 ENCOUNTER — Inpatient Hospital Stay (HOSPITAL_COMMUNITY)

## 2024-01-25 ENCOUNTER — Other Ambulatory Visit: Payer: Self-pay

## 2024-01-25 DIAGNOSIS — R571 Hypovolemic shock: Secondary | ICD-10-CM | POA: Diagnosis not present

## 2024-01-25 DIAGNOSIS — I2510 Atherosclerotic heart disease of native coronary artery without angina pectoris: Secondary | ICD-10-CM

## 2024-01-25 DIAGNOSIS — I4510 Unspecified right bundle-branch block: Secondary | ICD-10-CM

## 2024-01-25 DIAGNOSIS — K567 Ileus, unspecified: Secondary | ICD-10-CM | POA: Diagnosis not present

## 2024-01-25 DIAGNOSIS — K6389 Other specified diseases of intestine: Secondary | ICD-10-CM | POA: Diagnosis not present

## 2024-01-25 DIAGNOSIS — I420 Dilated cardiomyopathy: Secondary | ICD-10-CM

## 2024-01-25 DIAGNOSIS — K559 Vascular disorder of intestine, unspecified: Secondary | ICD-10-CM | POA: Diagnosis not present

## 2024-01-25 DIAGNOSIS — I4891 Unspecified atrial fibrillation: Secondary | ICD-10-CM | POA: Diagnosis not present

## 2024-01-25 DIAGNOSIS — Z955 Presence of coronary angioplasty implant and graft: Secondary | ICD-10-CM | POA: Diagnosis not present

## 2024-01-25 LAB — COMPREHENSIVE METABOLIC PANEL WITH GFR
ALT: 24 U/L (ref 0–44)
AST: 17 U/L (ref 15–41)
Albumin: 1.7 g/dL — ABNORMAL LOW (ref 3.5–5.0)
Alkaline Phosphatase: 55 U/L (ref 38–126)
Anion gap: 14 (ref 5–15)
BUN: 62 mg/dL — ABNORMAL HIGH (ref 8–23)
CO2: 20 mmol/L — ABNORMAL LOW (ref 22–32)
Calcium: 7.5 mg/dL — ABNORMAL LOW (ref 8.9–10.3)
Chloride: 98 mmol/L (ref 98–111)
Creatinine, Ser: 1.96 mg/dL — ABNORMAL HIGH (ref 0.61–1.24)
GFR, Estimated: 35 mL/min — ABNORMAL LOW (ref >60)
Glucose, Bld: 176 mg/dL — ABNORMAL HIGH (ref 70–99)
Potassium: 4.6 mmol/L (ref 3.5–5.1)
Sodium: 132 mmol/L — ABNORMAL LOW (ref 135–145)
Total Bilirubin: 0.8 mg/dL (ref 0.0–1.2)
Total Protein: 4.3 g/dL — ABNORMAL LOW (ref 6.5–8.1)

## 2024-01-25 LAB — CBC
HCT: 33.7 % — ABNORMAL LOW (ref 39.0–52.0)
Hemoglobin: 10.7 g/dL — ABNORMAL LOW (ref 13.0–17.0)
MCH: 28.4 pg (ref 26.0–34.0)
MCHC: 31.8 g/dL (ref 30.0–36.0)
MCV: 89.4 fL (ref 80.0–100.0)
Platelets: 297 K/uL (ref 150–400)
RBC: 3.77 MIL/uL — ABNORMAL LOW (ref 4.22–5.81)
RDW: 16.5 % — ABNORMAL HIGH (ref 11.5–15.5)
WBC: 4.7 K/uL (ref 4.0–10.5)
nRBC: 0 % (ref 0.0–0.2)

## 2024-01-25 LAB — GLUCOSE, CAPILLARY
Glucose-Capillary: 95 mg/dL (ref 70–99)
Glucose-Capillary: 97 mg/dL (ref 70–99)
Glucose-Capillary: 94 mg/dL (ref 70–99)
Glucose-Capillary: 102 mg/dL — ABNORMAL HIGH (ref 70–99)
Glucose-Capillary: 99 mg/dL (ref 70–99)
Glucose-Capillary: 87 mg/dL (ref 70–99)

## 2024-01-25 MED ORDER — SODIUM CHLORIDE 0.9 % IV SOLN
0.5000 mg | Freq: Once | INTRAVENOUS | Status: DC
  Filled 2024-01-25: qty 0.5

## 2024-01-25 MED ORDER — HYDROMORPHONE HCL 1 MG/ML IJ SOLN
0.5000 mg | INTRAMUSCULAR | Status: DC | PRN
  Administered 2024-01-25 – 2024-02-01 (×13): 0.5 mg via INTRAVENOUS
  Filled 2024-01-25: qty 0.5
  Filled 2024-01-25: qty 1
  Filled 2024-01-25: qty 0.5
  Filled 2024-01-25: qty 1
  Filled 2024-01-25 (×3): qty 0.5
  Filled 2024-01-25 (×2): qty 1
  Filled 2024-01-25: qty 0.5
  Filled 2024-01-25: qty 1
  Filled 2024-01-25 (×2): qty 0.5
  Filled 2024-01-25: qty 1

## 2024-01-25 NOTE — Progress Notes (Addendum)
 Rounding Note   Patient Name: Chase Combs Date of Encounter: 01/25/2024  [facility] Cardiologist: Alm Clay, MD   Subjective 3/10 abdominal pain. No CP or SOB.  -> Just says he does not feel well.  Scheduled Meds:  Chlorhexidine  Gluconate Cloth  6 each Topical Daily   heparin  injection (subcutaneous)  5,000 Units Subcutaneous Q8H   insulin  aspart  0-9 Units Subcutaneous Q4H   sodium chloride  flush  10-40 mL Intracatheter Q12H   Continuous Infusions:  amiodarone  30 mg/hr (01/25/24 0749)   cangrelor  (KENGREAL ) 50 mg in sodium chloride  0.9 % 250 mL (0.2 mg/mL) infusion 0.75 mcg/kg/min (01/25/24 0700)   norepinephrine  (LEVOPHED ) Adult infusion Stopped (01/24/24 1737)   piperacillin -tazobactam 12.5 mL/hr at 01/25/24 0700   PRN Meds: docusate sodium , HYDROmorphone  (DILAUDID ) injection, ondansetron  (ZOFRAN ) IV, polyethylene glycol, sodium chloride  flush   Vital Signs  Vitals:   01/25/24 0645 01/25/24 0700 01/25/24 0703 01/25/24 0721  BP:  (!) 109/55    Pulse:  82 68   Resp: 17 16 15    Temp:    (!) 97.4 F (36.3 C)  TempSrc:    Oral  SpO2:  97% 98%   Weight:        Intake/Output Summary (Last 24 hours) at 01/25/2024 0909 Last data filed at 01/25/2024 0700 Gross per 24 hour  Intake 2514.02 ml  Output 2000 ml  Net 514.02 ml      01/25/2024    5:00 AM 01/24/2024    4:23 AM 01/23/2024    5:00 AM  Last 3 Weights  Weight (lbs) 207 lb 7.3 oz 203 lb 0.7 oz 194 lb 3.6 oz  Weight (kg) 94.1 kg 92.1 kg 88.1 kg      Telemetry Atrial fibrillation with HR 100-110s - Personally Reviewed  ECG  Atrial fibrillation with RBBB - Personally Reviewed  Physical Exam  GEN: No acute distress.   Neck: No JVD Cardiac: irregularly irregular, no murmurs, rubs, or gallops.  Respiratory: Clear to auscultation bilaterally. GI: Soft, nontender, non-distended  MS: No edema; No deformity. Neuro:  Nonfocal  Psych: Normal affect   Labs High Sensitivity Troponin:   Recent Labs   Lab 01/08/24 0435 01/22/24 1243 01/22/24 1443  TROPONINIHS 65* 126* 108*     Chemistry Recent Labs  Lab 01/19/24 1129 01/22/24 1243 01/22/24 1259 01/22/24 2159 01/23/24 1111 01/24/24 0755 01/24/24 2221 01/25/24 0752  NA 133* 132*   < > 135 133* 138 133* 132*  K 3.5 4.5   < > 2.6* 3.0* 3.3* 3.2* 4.6  CL 88* 81*   < > 91* 96* 98 98 98  CO2 34* 31  --  29 26 26 23  20*  GLUCOSE 169* 197*   < > 137* 106* 127* 104* 176*  BUN 21 64*   < > 69* 58* 56* 61* 62*  CREATININE 0.76 2.55*   < > 2.14* 1.50* 1.46* 1.94* 1.96*  CALCIUM  8.5* 8.6*  --  7.8* 7.6* 8.3* 7.6* 7.5*  MG 2.2  --   --  2.1 2.0  --   --   --   PROT  --  6.2*  --   --   --   --   --  4.3*  ALBUMIN  --  3.0*  --   --   --   --   --  1.7*  AST  --  29  --   --   --   --   --  17  ALT  --  31  --   --   --   --   --  24  ALKPHOS  --  64  --   --   --   --   --  55  BILITOT  --  1.4*  --   --   --   --   --  0.8  GFRNONAA >60 25*  --  31* 48* 50* 35* 35*  ANIONGAP 11 20*  --  15 11 14 12 14    < > = values in this interval not displayed.    Lipids No results for input(s): CHOL, TRIG, HDL, LABVLDL, LDLCALC, CHOLHDL in the last 168 hours.  Hematology Recent Labs  Lab 01/23/24 1111 01/24/24 0336 01/25/24 0752  WBC 4.4 4.8 4.7  RBC 4.05* 3.93* 3.77*  HGB 11.6* 11.2* 10.7*  HCT 36.0* 35.4* 33.7*  MCV 88.9 90.1 89.4  MCH 28.6 28.5 28.4  MCHC 32.2 31.6 31.8  RDW 16.6* 16.5* 16.5*  PLT 355 344 297   Thyroid  No results for input(s): TSH, FREET4 in the last 168 hours.  BNP Recent Labs  Lab 01/22/24 1243  BNP 220.4*    DDimer No results for input(s): DDIMER in the last 168 hours.   Radiology  DG Abd Portable 1V Result Date: 01/24/2024 EXAM: 1 VIEW XRAY OF THE ABDOMEN 01/24/2024 01:00:00 PM COMPARISON: 01/24/2024 CLINICAL HISTORY: Encounter for imaging study to confirm orogastric (OG) tube placement FINDINGS: BOWEL: Diffuse gaseous distension of the colon, unchanged. SOFT TISSUES: No opaque  urinary calculi. Enteric tube in place with tip and side port projecting over the expected region of the gastric fundus. BONES: No acute osseous abnormality. IMPRESSION: 1. Enteric tube in place with tip and side port projecting over the expected region of the gastric fundus. 2. Diffuse gaseous distension of the colon, unchanged, presumably colonic ileus. Electronically signed by: Selinda Blue MD 01/24/2024 01:42 PM EDT RP Workstation: [id number]   DG Chest Port 1 View Result Date: 01/24/2024 EXAM: 1 VIEW XRAY OF THE CHEST 01/24/2024 01:00:00 PM COMPARISON: 01/22/2024 CLINICAL HISTORY: S/P PICC central line placement FINDINGS: LUNGS AND PLEURA: Low lung volumes. Elevated right hemidiaphragm. Bibasilar airspace opacities, likely atelectasis. Trace bilateral pleural effusions. HEART AND MEDIASTINUM: No acute abnormality of the cardiac and mediastinal silhouettes. BONES AND SOFT TISSUES: Advanced degenerative changes of right shoulder. LINES AND TUBES: Left upper extremity PICC with tip overlying the cavoatrial junction. Enteric tube courses into the stomach below the edge of the image with tip not seen. IMPRESSION: 1. Left PICC tip overlying the cavoatrial junction. 2. Low lung volumes, elevated right hemidiaphragm, and bibasilar airspace opacities, likely atelectasis. 3. Trace bilateral pleural effusions. Electronically signed by: Selinda Blue MD 01/24/2024 01:41 PM EDT RP Workstation: [id number]   US  EKG SITE RITE Result Date: 01/24/2024 If Site Rite image not attached, placement could not be confirmed due to current cardiac rhythm.  DG Abd 1 View Result Date: 01/24/2024 CLINICAL DATA:  Ileus. EXAM: ABDOMEN - 1 VIEW COMPARISON:  11/29/2023.  CT scan 01/22/2024. FINDINGS: Marked gaseous distention of colon identified, similar to the scout film from yesterday's CT scan. Fiducial markers overlie the prostate region. IMPRESSION: Marked gaseous distention of colon, similar to the scout film from yesterday's CT scan.  Electronically Signed   By: Camellia Candle M.D.   On: 01/24/2024 09:29    Cardiac Studies  Echo 01/13/2024: LVEF 25 to 30% with severely decreased function and global HK.  G1 DD. Moderate LA dilation. Mildly dilated RV with severely elevated  PAP and RAP estimated 15 mmHg..   Mild RA dilation.  Mostly normal aortic and mitral valves. Echo 07/12/2023: EF 40 to 45% global HK and no RWMA.  GR 1 DD.  Mild-moderate LA dilation.  Normal RAP R&L Cardiac Cath-PCI 01/17/2024: 70-80% proximal LAD (RFR 0.67)=> DES PCI with Synergy XD 3.0 x 20 mm deployed to 3.2 mm..  Moderate LMCA (MLA 6.5 mm).  40% proximal to mid RCA.  50% ISR in RI stent and 43% ISR in proximal RCA stent    Patient Profile   78 y.o. male with PMH of CAD (recent DES to ost/prox LAD 01/17/2024), OSA, DM II, prostate CA who presented with weakness and N/V. Workup showed pneumatosis and massively dilated loops of bowel. Cardiology initially consulted due to recent PCI, plan was to hold Brilinta  and bridge with IV cangrelor  given inability to take PO. He went into new afib on 01/24/2024 and was started on IV amiodarone .   Assessment & Plan   CAD: Recent ost/prox LAD DES (01/17/2024), current NPO due to ileus, home Brilinta  switched to IV cangrelor .  Thankfully no ongoing angina symptoms. Once stable to restart p.o. meds, can switch back to Plavix  with Plavix  load since he would likely be on DOAC with new diagnosis of A-fib.  PAF: went into afib around 4AM on 01/24/2024, started on IV amiodarone .  Afib likely driven by ileus. HR 100-110s, borderline controlled HR given underlying ileus Hesitant to do IV heparin  on top of IV cangrelor  due to bleeding risk, currently on subcu heparin .  For now continue IV amiodarone  for rate control Once able to take p.o. can switch from IV Cangrelor  to Plavix , and add DOAC  RBBB: new RBBB during this admission, initial EKG obtained on 8/30 did not showed RBBB, repeat EKG on 8/30 night showed RBBB. Still in afib with  RBBB at this time. ?rate driven? No sign of stent thrombosis.  Ileus: seen by general surgery, felt no clear indication for surgery at this time.   AKI: presented with Cr 2.5 on 8/30, improved to 1.5 by 8/31, Cr 1.46 on 9/1,  worsened to 1.96 on 9/2. Likely related underlying GI issue.   Cardiomyopathy with most recent EF 25 to 30%.  Has not been rechecked post PCI but this was in the setting of significant pneumonia with metapneumovirus and LAD stent.  Recent Myoview  as of February was nonischemic but EF was low despite echo EF being normal.  Not able to use GDMT as he remains NPO. =>  Would not recheck an echocardiogram now since he is in A-fib with rates in the 100s, would not like to get a good estimation of his EF.  Can reassess in the outpatient setting. Once able to take p.o., in addition to converting amiodarone  to p.o., can consider afterload reduction with ARB versus hydralazine /nitrate pending renal function Seems to be relatively euvolemic with no signs of CHF.     SignedScot Ford, PA  01/25/2024, 9:09 AM      ATTENDING ATTESTATION  I have seen, examined and evaluated the patient this AM on rounds along with Hao Meng, PA.  After reviewing all the available data and chart, we discussed the patients laboratory, study & physical findings as well as symptoms in detail.  I agree with his findings, examination as well as impression recommendations as per our discussion.    Attending adjustments noted in italics.   Continues to have issues with significant ileus.  Notable discomfort.  This is probably driving the  A-fib however rate is relatively well-controlled especially in light of his acute illness. With recent DES stent to the LAD is now on cangrelor  which precludes us  being able to use IV heparin  for new onset A-fib.  Once able to take p.o. we can convert from IV cangrelor  to oral Plavix  and DOAC.  We can also convert from oral amiodarone  to p.o. Right bundle branch block is  likely rate related although cannot be sure.  Does not appear to be ischemic and that he is not actively having chest pain.  With his reduced EF during recent hospitalization I suspect that is likely related to acute illness and it would not likely improve at this point in time.  Since he is not having active heart failure symptoms, no acute need to reassess EF especially in setting of A-fib with rates in the 100s and frequent ectopy.  Would not be very accurate study.    Cardiology will follow along peripherally as there is not much new to add until he is able to start taking p.o.  Expect that his heart rate will stay in the 90s to low 100s with his ongoing GI issues.  For questions or updates, please contact [facility] Please consult [email or web address] for contact info under       Alm MICAEL Clay, MD, MS Alm Clay, M.D., M.S. Interventional Chartered certified accountant  Pager # [phone]

## 2024-01-25 NOTE — Progress Notes (Addendum)
 Intestinal ischemia (HCC)  Subjective: Awake and alert. Rates abdominal pain as 2/10 after dilaudid . Prior to dilaudid  he says he was having pain all across his lower abdomen. NG tube was placed yesterday and is low output.   Afebrile, went into afib with RVR overnight, on IV amio w/ HR 140 WBC 4.7 Hgb 10.7 (from 11.2)  Objective: Vital signs in last 24 hours: Temp:  [97.4 F (36.3 C)-98.2 F (36.8 C)] 97.4 F (36.3 C) (09/02 0721) Pulse Rate:  [60-140] 140 (09/02 0900) Resp:  [15-33] 22 (09/02 0900) BP: (97-158)/(45-137) 111/54 (09/02 0900) SpO2:  [81 %-100 %] 93 % (09/02 0900) Weight:  [94.1 kg] 94.1 kg (09/02 0500) Last BM Date : 01/24/24  Intake/Output from previous day: 09/01 0701 - 09/02 0700 In: 2878.1 [I.V.:1463.2; IV Piggyback:1414.9] Out: 2000 [Urine:650; Emesis/NG output:100; Stool:1250] Intake/Output this shift: No intake/output data recorded.  Gen: alert, cooperative NAD CV: irregularly irregular  Pulm: normal effort  Abd: soft, moderately distended, non-tender on my exam but did get dilaudid , no guarding, rectal pouch in place draining non-bloody liquid stool. NGT in place with minimal output   Lab Results:  Results for orders placed or performed during the hospital encounter of 01/22/24 (from the past 24 hours)  Glucose, capillary     Status: Abnormal   Collection Time: 01/24/24 11:22 AM  Result Value Ref Range   Glucose-Capillary 155 (H) 70 - 99 mg/dL  Glucose, capillary     Status: Abnormal   Collection Time: 01/24/24  3:20 PM  Result Value Ref Range   Glucose-Capillary 144 (H) 70 - 99 mg/dL  Lactic acid, plasma     Status: None   Collection Time: 01/24/24  6:57 PM  Result Value Ref Range   Lactic Acid, Venous 1.1 0.5 - 1.9 mmol/L  Glucose, capillary     Status: Abnormal   Collection Time: 01/24/24  7:45 PM  Result Value Ref Range   Glucose-Capillary 104 (H) 70 - 99 mg/dL  Basic metabolic panel with GFR     Status: Abnormal   Collection Time:  01/24/24 10:21 PM  Result Value Ref Range   Sodium 133 (L) 135 - 145 mmol/L   Potassium 3.2 (L) 3.5 - 5.1 mmol/L   Chloride 98 98 - 111 mmol/L   CO2 23 22 - 32 mmol/L   Glucose, Bld 104 (H) 70 - 99 mg/dL   BUN 61 (H) 8 - 23 mg/dL   Creatinine, Ser 8.05 (H) 0.61 - 1.24 mg/dL   Calcium  7.6 (L) 8.9 - 10.3 mg/dL   GFR, Estimated 35 (L) >60 mL/min   Anion gap 12 5 - 15  Glucose, capillary     Status: None   Collection Time: 01/24/24 11:22 PM  Result Value Ref Range   Glucose-Capillary 96 70 - 99 mg/dL  Glucose, capillary     Status: None   Collection Time: 01/25/24  3:41 AM  Result Value Ref Range   Glucose-Capillary 95 70 - 99 mg/dL  Glucose, capillary     Status: None   Collection Time: 01/25/24  7:20 AM  Result Value Ref Range   Glucose-Capillary 97 70 - 99 mg/dL  CBC     Status: Abnormal   Collection Time: 01/25/24  7:52 AM  Result Value Ref Range   WBC 4.7 4.0 - 10.5 K/uL   RBC 3.77 (L) 4.22 - 5.81 MIL/uL   Hemoglobin 10.7 (L) 13.0 - 17.0 g/dL   HCT 66.2 (L) 60.9 - 47.9 %   MCV 89.4  80.0 - 100.0 fL   MCH 28.4 26.0 - 34.0 pg   MCHC 31.8 30.0 - 36.0 g/dL   RDW 83.4 (H) 88.4 - 84.4 %   Platelets 297 150 - 400 K/uL   nRBC 0.0 0.0 - 0.2 %  Comprehensive metabolic panel with GFR     Status: Abnormal   Collection Time: 01/25/24  7:52 AM  Result Value Ref Range   Sodium 132 (L) 135 - 145 mmol/L   Potassium 4.6 3.5 - 5.1 mmol/L   Chloride 98 98 - 111 mmol/L   CO2 20 (L) 22 - 32 mmol/L   Glucose, Bld 176 (H) 70 - 99 mg/dL   BUN 62 (H) 8 - 23 mg/dL   Creatinine, Ser 8.03 (H) 0.61 - 1.24 mg/dL   Calcium  7.5 (L) 8.9 - 10.3 mg/dL   Total Protein 4.3 (L) 6.5 - 8.1 g/dL   Albumin 1.7 (L) 3.5 - 5.0 g/dL   AST 17 15 - 41 U/L   ALT 24 0 - 44 U/L   Alkaline Phosphatase 55 38 - 126 U/L   Total Bilirubin 0.8 0.0 - 1.2 mg/dL   GFR, Estimated 35 (L) >60 mL/min   Anion gap 14 5 - 15     Studies/Results Radiology     MEDS, Scheduled  Chlorhexidine  Gluconate Cloth  6 each  Topical Daily   heparin  injection (subcutaneous)  5,000 Units Subcutaneous Q8H   insulin  aspart  0-9 Units Subcutaneous Q4H   sodium chloride  flush  10-40 mL Intracatheter Q12H     Assessment: Intestinal ischemia (HCC) Colonic ileus, possible ischemic colitis in the setting of recent cardiac stent to LAD on Brilinta  (held)   Plan: Agree with NG tube placement for additional decompression  Has a rectal pouch in place  Cont IV Zosyn  On IV cangrelor  right now for stent  SQH for DVT ppx, currently in afib with RVR. Agree with cardiology and would not start heparin  gtt at this time due to high bleeding risk in combination with IV antiplatelet therapy and colitis. No clear indication for surgery at this time, appears to be adynamic ileus of the colon; Medical management - consider GI consult for endoscopic decompression and evaluation of ileus/colitis.   Per primary team --  AKI  CAD s/p DES to LAD - IV cangrelor   Afib/RVR - amio gtt  Chronic systolic CHF Leukopenia - WBC 4    LOS: 3 days    Almarie Pringle, [facility] Please see Amion for pager number during day hours 7:00am-4:30pm       Patient's medical decision making was moderate    01/25/2024 10:31 AM

## 2024-01-25 NOTE — Plan of Care (Signed)
  Problem: Education: Goal: Knowledge of General Education information will improve Description: Including pain rating scale, medication(s)/side effects and non-pharmacologic comfort measures Outcome: Progressing   Problem: Clinical Measurements: Goal: Ability to maintain clinical measurements within normal limits will improve Outcome: Progressing   Problem: Clinical Measurements: Goal: Will remain free from infection Outcome: Progressing   Problem: Clinical Measurements: Goal: Diagnostic test results will improve Outcome: Progressing   Problem: Clinical Measurements: Goal: Respiratory complications will improve Outcome: Progressing   Problem: Clinical Measurements: Goal: Cardiovascular complication will be avoided Outcome: Progressing   Problem: Activity: Goal: Risk for activity intolerance will decrease Outcome: Progressing   Problem: Nutrition: Goal: Adequate nutrition will be maintained Outcome: Progressing   Problem: Pain Managment: Goal: General experience of comfort will improve and/or be controlled Outcome: Progressing   Problem: Safety: Goal: Ability to remain free from injury will improve Outcome: Progressing   Problem: Skin Integrity: Goal: Risk for impaired skin integrity will decrease Outcome: Progressing   Problem: Metabolic: Goal: Ability to maintain appropriate glucose levels will improve Outcome: Progressing   Problem: Nutritional: Goal: Maintenance of adequate nutrition will improve Outcome: Progressing   Problem: Skin Integrity: Goal: Risk for impaired skin integrity will decrease Outcome: Progressing   Problem: Tissue Perfusion: Goal: Adequacy of tissue perfusion will improve Outcome: Progressing    Plan of care, assessment, monitoring, treatment, and intervention (s) ongoing, see MAR see flowsheet

## 2024-01-25 NOTE — Progress Notes (Signed)
 NAME:  Chase Combs, MRN:  [id number], DOB:  1945/10/14, LOS: 3 ADMISSION DATE:  01/22/2024, CONSULTATION DATE:  01/25/2024 REFERRING MD:  ED, CHIEF COMPLAINT:  abdominal pain   History of Present Illness:  This is a pleasant 78 yo man w/ hx of ischemic cardiomyopathy and LAD stent placement for NSTEMI discharged on 8/27 with progressive n/v with CT imaging concerning for ischemic colitis.    Patient was feeling unwell and weak since discharge 3 days ago. Nausea and vomiting were worsening progressively and he was unable to hold down his antiplatelet medicines. Presented to ED where CT images showed bowel wall gas and findings c/f ileus. Patient had elevated lactate, and was hypotensive, received volume resuscitation, placed on broad spectrum abx. Also found to have acute renal failure, anion gap metabolic acidosis, leukopenia and lymphopenia.  Pertinent  Medical History  CAD, CHF   Significant Hospital Events: Including procedures, antibiotic start and stop dates in addition to other pertinent events   8/30 admitted with pneumatosis intestinalis/ileus. Gen surg consulted, recommend conservative management  Interim History / Subjective:  Patient had increased nausea yesterday and overnight. Lactate was 1.1, rectal tube and nasogastric tube placed for decompression. Surgical intervention deferred due to high risk for mortality. PICC line placed for ongoing access.   Objective    Blood pressure (!) 109/55, pulse 68, temperature 98.2 F (36.8 C), temperature source Axillary, resp. rate 15, weight 94.1 kg, SpO2 98%. CVP:  [2 mmHg-8 mmHg] 5 mmHg      Intake/Output Summary (Last 24 hours) at 01/25/2024 9287 Last data filed at 01/25/2024 0700 Gross per 24 hour  Intake 2878.08 ml  Output 2000 ml  Net 878.08 ml   Filed Weights   01/23/24 0500 01/24/24 0423 01/25/24 0500  Weight: 88.1 kg 92.1 kg 94.1 kg    Examination: General: Ill appearing, elderly gentleman, no distress HENT: PERRLA  EOMI Lungs: CTAB, no increased WOB, nasal canula in place Cardiovascular: Tachycardic, no m/r/g Abdomen: Distended, diffusely tender. Less tense from yesterday. Improved bowel sounds, more dynamic, still tympanitic Extremities: Warm, well perfused  Resolved problem list  Lactic Acidosis Assessment and Plan  Severe sepsis with septic shock due to intra-abdominal source, POA Intestinal pneumatosis with ischemic vs infectious colitis: Severe Atherosclerotic disease as evidenced by severe CAD Severe Ileus General surgery following, continue to recommend conservative management Continue NPO Continue aggressive electrolyte management, goal K 4-5, mag 2 IV zosyn  for intra-abdominal infection coverage Rectal and Nasogastric decompression Surgical intervention as a last resort given high risk of mortality Daily KUB to monitor dilation Consider neostigmine  this PM if worsening Manage pain as needed with dilaudid    Pneumonia Continue above abx   AKI due to Septic ATN Electrolyte derangement Slight bump from yesterday, baseline 0.7, currently 1.94, likely related to starting amio yesterday Fluid orders expired from yesterday, holding maintenance fluids in setting of CHF Hold Entresto , lasix , toprol , and aldactone  Avoid nephrotoxic agents Strict Is and Os Electrolyte replacement as above Calcium  low today, checking ionized calcium  to guide repletion   CAD w/ LAD drug eluting stent Chronic systolic CHF 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 Management per cardiology Aggressive electrolyte repletion   Leukopenia d/t sepsis Continue supportive management.   Labs   CBC: Recent Labs  Lab 01/22/24 1243 01/22/24 1259 01/23/24 1111 01/24/24 0336  WBC 2.4*  --  4.4 4.8  NEUTROABS 1.7  --   --   --  HGB 13.9 15.3 11.6* 11.2*  HCT 43.2 45.0 36.0* 35.4*  MCV 88.5  --  88.9 90.1   PLT 489*  --  355 344    Basic Metabolic Panel: Recent Labs  Lab 01/19/24 1129 01/22/24 1243 01/22/24 1259 01/22/24 2159 01/23/24 1111 01/24/24 0755 01/24/24 2221  NA 133* 132* 131* 135 133* 138 133*  K 3.5 4.5 4.3 2.6* 3.0* 3.3* 3.2*  CL 88* 81* 87* 91* 96* 98 98  CO2 34* 31  --  29 26 26 23   GLUCOSE 169* 197* 199* 137* 106* 127* 104*  BUN 21 64* 65* 69* 58* 56* 61*  CREATININE 0.76 2.55* 2.50* 2.14* 1.50* 1.46* 1.94*  CALCIUM  8.5* 8.6*  --  7.8* 7.6* 8.3* 7.6*  MG 2.2  --   --  2.1 2.0  --   --    GFR: Estimated Creatinine Clearance: 38.6 mL/min (A) (by C-G formula based on SCr of 1.94 mg/dL (H)). Recent Labs  Lab 01/22/24 1243 01/22/24 1300 01/22/24 1500 01/23/24 1111 01/24/24 0336 01/24/24 1002 01/24/24 1857  PROCALCITON 1.23  --   --   --   --   --   --   WBC 2.4*  --   --  4.4 4.8  --   --   LATICACIDVEN  --  3.4* 1.9  --   --  1.1 1.1    Liver Function Tests: Recent Labs  Lab 01/22/24 1243  AST 29  ALT 31  ALKPHOS 64  BILITOT 1.4*  PROT 6.2*  ALBUMIN 3.0*   No results for input(s): LIPASE, AMYLASE in the last 168 hours. No results for input(s): AMMONIA in the last 168 hours.  ABG    Component Value Date/Time   PHART 7.539 (H) 01/17/2024 1132   PCO2ART 46.9 01/17/2024 1132   PO2ART 61 (L) 01/17/2024 1132   HCO3 40.0 (H) 01/17/2024 1132   TCO2 34 (H) 01/22/2024 1259   ACIDBASEDEF 1.0 12/08/2006 1859   O2SAT 93 01/17/2024 1132     Coagulation Profile: No results for input(s): INR, PROTIME in the last 168 hours.  Cardiac Enzymes: No results for input(s): CKTOTAL, CKMB, CKMBINDEX, TROPONINI in the last 168 hours.  HbA1C: Hgb A1c MFr Bld  Date/Time Value Ref Range Status  09/22/2023 08:46 AM 6.4 (H) 4.8 - 5.6 % Final    Comment:    (NOTE) Pre diabetes:          5.7%-6.4%  Diabetes:              >6.4%  Glycemic control for   <7.0% adults with diabetes   03/01/2020 09:31 AM 6.9 (H) 4.8 - 5.6 % Final    Comment:              Prediabetes: 5.7 - 6.4          Diabetes: >6.4          Glycemic control for adults with diabetes: <7.0     CBG: Recent Labs  Lab 01/24/24 1122 01/24/24 1520 01/24/24 1945 01/24/24 2322 01/25/24 0341  GLUCAP 155* 144* 104* 96 95    Review of Systems:   Denies CP, SOB. Continued nausea, general body pain  Past Medical History:  He,  has a past medical history of Abdominal hernia, Arthritis, Benign localized prostatic hyperplasia with lower urinary tract symptoms (LUTS), CAD (coronary artery disease) (05/2004), Dyslipidemia, Essential hypertension, Heart murmur, History of COVID-19 (05/2020), History of ST elevation myocardial infarction (STEMI) (02/16/2009), History of urinary retention, Malignant neoplasm  of prostate (HCC) (04/2021), OSA (obstructive sleep apnea), PONV (postoperative nausea and vomiting), Pre-diabetes, and S/p bare metal coronary artery stent.   Surgical History:   Past Surgical History:  Procedure Laterality Date   CARDIAC CATHETERIZATION  05/27/2004   outpatient by dr burnard [facility] cardiology) for positive stress test;  80% stensis pRI   CORONARY ANGIOPLASTY WITH STENT PLACEMENT  06/18/2004   @[facility]  by Dr Lavon;  PCI w/ BMS to pRI  (CoStar study stent - 2.5 x 16)   CORONARY ANGIOPLASTY WITH STENT PLACEMENT  02/16/2009   @[facility]  by dr verlin;   Inferior STEMI:  PCI w/ thrombectomy total occluded RCA, BMS x1 to pRCA (Vision BMS 3.5 x 23 -> 4.0 mm), residual 40-50% pLAD, ef 45-50%   CORONARY PRESSURE/FFR STUDY N/A 01/17/2024   Procedure: CORONARY PRESSURE/FFR STUDY;  Surgeon: Mady Bruckner, MD;  Location: [facility];  Service: Cardiovascular;  Laterality: N/A;   CORONARY STENT INTERVENTION N/A 01/17/2024   Procedure: CORONARY STENT INTERVENTION;  Surgeon: Mady Bruckner, MD;  Location: [facility];  Service: Cardiovascular;  Laterality: N/A;   CORONARY ULTRASOUND/IVUS N/A 01/17/2024   Procedure: Coronary Ultrasound/IVUS;  Surgeon:  Mady Bruckner, MD;  Location: [facility];  Service: Cardiovascular;  Laterality: N/A;   FLEXIBLE SIGMOIDOSCOPY N/A 10/20/2023   Procedure: KINGSTON SIDE;  Surgeon: Legrand Victory LITTIE DOUGLAS, MD;  Location: [facility];  Service: Gastroenterology;  Laterality: N/A;   GOLD SEED IMPLANT N/A 09/19/2021   Procedure: GOLD SEED IMPLANT;  Surgeon: Cam Morene ORN, MD;  [facility];  Service: Urology;  Laterality: N/A;  ONLY NEEDS 30 MIN FOR ALL   INGUINAL HERNIA REPAIR Right 09/02/1999   @[facility] ;   recurrent repair right inguinal hernia;   previous repair approx 1990s   INGUINAL HERNIA REPAIR Left    1990s   KNEE ARTHROSCOPY W/ MENISCAL REPAIR Right 12/18/2005   @[facility]    LUMBAR DISC SURGERY  1982   L5--S1   RIGHT/LEFT HEART CATH AND CORONARY ANGIOGRAPHY N/A 01/17/2024   Procedure: RIGHT/LEFT HEART CATH AND CORONARY ANGIOGRAPHY;  Surgeon: Mady Bruckner, MD;  Location: [facility];  Service: Cardiovascular;  Laterality: N/A;   SPACE OAR INSTILLATION N/A 09/19/2021   Procedure: SPACE OAR INSTILLATION;  Surgeon: Cam Morene ORN, MD;  Location: [facility];  Service: Urology;  Laterality: N/A;   TOTAL KNEE ARTHROPLASTY Left 10/01/2023   Procedure: ARTHROPLASTY, KNEE, TOTAL;  Surgeon: Kay Kemps, MD;  Location: [facility];  Service: Orthopedics;  Laterality: Left;     Social History:   reports that he has never smoked. He has never used smokeless tobacco. He reports that he does not drink alcohol and does not use drugs.   Family History:  His family history includes Lung cancer in his mother. There is no history of Colon cancer or Esophageal cancer.   Allergies Allergies  Allergen Reactions   Codeine Nausea And Vomiting   Latex Rash    Oral blisters when used at dentist     Home Medications  Prior to Admission medications   Medication Sig Start Date End Date Taking? Authorizing Provider  amiodarone  (PACERONE ) 200 MG tablet Take 1  tablet (200 mg total) by mouth daily. 01/19/24  Yes Fairy Frames, MD  aspirin  EC 81 MG tablet Take 81 mg by mouth 2 (two) times daily.   Yes [provider]  CALCIUM  PO Take 1 tablet by mouth daily.   Yes [provider]  clotrimazole-betamethasone (LOTRISONE) cream  Apply 1 Application topically 2 (two) times daily as needed (skin irritation).   Yes [provider]  Cyanocobalamin  (VITAMIN B-12 PO) Take 1 tablet by mouth daily.   Yes [provider]  furosemide  (LASIX ) 20 MG tablet Take 1 tablet (20 mg total) by mouth daily. 01/19/24  Yes Croitoru, Mihai, MD  metoprolol  succinate (TOPROL -XL) 25 MG 24 hr tablet Take 1 tablet (25 mg total) by mouth daily. 01/19/24  Yes Fairy Frames, MD  Multiple Vitamins-Minerals (MULTIVITAMIN MEN 50+) TABS Take 1 tablet by mouth daily.   Yes [provider]  Probiotic Product (PROBIOTIC PO) Take 1 capsule by mouth daily.   Yes [provider]  rosuvastatin  (CRESTOR ) 20 MG tablet TAKE 1 TABLET(20 MG) BY MOUTH DAILY 11/29/23  Yes Anner Alm ORN, MD  sacubitril -valsartan  (ENTRESTO ) 49-51 MG Take 1 tablet by mouth 2 (two) times daily. 01/19/24  Yes Fairy Frames, MD  spironolactone  (ALDACTONE ) 25 MG tablet Take 0.5 tablets (12.5 mg total) by mouth daily. 01/19/24  Yes Fairy Frames, MD  tamsulosin  (FLOMAX ) 0.4 MG CAPS capsule Take 1 capsule (0.4 mg total) by mouth daily after supper. 11/17/21  Yes Patrcia Cough, MD  ticagrelor  (BRILINTA ) 90 MG TABS tablet Take 1 tablet (90 mg total) by mouth 2 (two) times daily. 01/19/24  Yes Fairy Frames, MD  nitroGLYCERIN  (NITROSTAT ) 0.4 MG SL tablet Place 1 tablet (0.4 mg total) under the tongue every 5 (five) minutes as needed for chest pain. Patient not taking: Reported on 01/23/2024 01/19/24 01/18/25  Fairy Frames, MD  ondansetron  (ZOFRAN -ODT) 4 MG disintegrating tablet Take 1 tablet (4 mg total) by mouth every 8 (eight) hours as needed for nausea or vomiting. Patient  not taking: Reported on 01/23/2024 01/21/24   Anner Alm ORN, MD     Critical care time:

## 2024-01-26 ENCOUNTER — Other Ambulatory Visit (HOSPITAL_BASED_OUTPATIENT_CLINIC_OR_DEPARTMENT_OTHER): Payer: Self-pay

## 2024-01-26 DIAGNOSIS — K559 Vascular disorder of intestine, unspecified: Secondary | ICD-10-CM | POA: Diagnosis not present

## 2024-01-26 LAB — COMPREHENSIVE METABOLIC PANEL WITH GFR
ALT: 21 U/L (ref 0–44)
AST: 14 U/L — ABNORMAL LOW (ref 15–41)
Albumin: 1.7 g/dL — ABNORMAL LOW (ref 3.5–5.0)
Alkaline Phosphatase: 52 U/L (ref 38–126)
Anion gap: 10 (ref 5–15)
BUN: 68 mg/dL — ABNORMAL HIGH (ref 8–23)
CO2: 22 mmol/L (ref 22–32)
Calcium: 7.6 mg/dL — ABNORMAL LOW (ref 8.9–10.3)
Chloride: 102 mmol/L (ref 98–111)
Creatinine, Ser: 1.99 mg/dL — ABNORMAL HIGH (ref 0.61–1.24)
GFR, Estimated: 34 mL/min — ABNORMAL LOW (ref >60)
Glucose, Bld: 92 mg/dL (ref 70–99)
Potassium: 2.7 mmol/L — CL (ref 3.5–5.1)
Sodium: 134 mmol/L — ABNORMAL LOW (ref 135–145)
Total Bilirubin: 0.9 mg/dL (ref 0.0–1.2)
Total Protein: 4.4 g/dL — ABNORMAL LOW (ref 6.5–8.1)

## 2024-01-26 LAB — POTASSIUM: Potassium: 3.7 mmol/L (ref 3.5–5.1)

## 2024-01-26 LAB — CBC
HCT: 33.2 % — ABNORMAL LOW (ref 39.0–52.0)
Hemoglobin: 10.7 g/dL — ABNORMAL LOW (ref 13.0–17.0)
MCH: 28.5 pg (ref 26.0–34.0)
MCHC: 32.2 g/dL (ref 30.0–36.0)
MCV: 88.5 fL (ref 80.0–100.0)
Platelets: 299 K/uL (ref 150–400)
RBC: 3.75 MIL/uL — ABNORMAL LOW (ref 4.22–5.81)
RDW: 16.8 % — ABNORMAL HIGH (ref 11.5–15.5)
WBC: 5.5 K/uL (ref 4.0–10.5)
nRBC: 0 % (ref 0.0–0.2)

## 2024-01-26 LAB — GLUCOSE, CAPILLARY
Glucose-Capillary: 76 mg/dL (ref 70–99)
Glucose-Capillary: 102 mg/dL — ABNORMAL HIGH (ref 70–99)
Glucose-Capillary: 91 mg/dL (ref 70–99)
Glucose-Capillary: 122 mg/dL — ABNORMAL HIGH (ref 70–99)
Glucose-Capillary: 100 mg/dL — ABNORMAL HIGH (ref 70–99)
Glucose-Capillary: 104 mg/dL — ABNORMAL HIGH (ref 70–99)

## 2024-01-26 LAB — PHOSPHORUS: Phosphorus: 3.3 mg/dL (ref 2.5–4.6)

## 2024-01-26 LAB — MAGNESIUM: Magnesium: 3 mg/dL — ABNORMAL HIGH (ref 1.7–2.4)

## 2024-01-26 LAB — CALCIUM, IONIZED: Calcium, Ionized, Serum: 4.8 mg/dL (ref 4.5–5.6)

## 2024-01-26 MED ORDER — POTASSIUM CHLORIDE 10 MEQ/50ML IV SOLN
10.0000 meq | INTRAVENOUS | Status: AC
  Administered 2024-01-26 (×6): 10 meq via INTRAVENOUS
  Filled 2024-01-26 (×6): qty 50

## 2024-01-26 MED ORDER — POTASSIUM CHLORIDE 20 MEQ PO PACK
40.0000 meq | PACK | Freq: Once | ORAL | Status: AC
  Administered 2024-01-26: 40 meq
  Filled 2024-01-26: qty 2

## 2024-01-26 NOTE — Progress Notes (Signed)
 PROGRESS NOTE    Chase Combs  FMW:[id number] DOB: August 17, 1945 DOA: 01/22/2024 PCP: Wonda Worth SQUIBB, PA   Brief Narrative:  78 year old man w/ hx of ischemic cardiomyopathy and very recent LAD stent placement for NSTEMI discharged 8/27, was doing well for few days, presented back to the ED on 01/23/2024 with progressive nausea vomiting with CT images concerning for Intestinal pneumatosis with ischemic vs infectious colitis.  Patient was in septic shock, admitted under ICU/PCCM, general surgery and cardiology consulted.  Complicated hospitalization for which refer to details below.  Patient eventually was transferred to [facility] on 01/26/2024.  Assessment & Plan:   Principal Problem:   Intestinal ischemia (HCC) Active Problems:   Sepsis (HCC)   Pneumatosis intestinalis of large intestine  septic shock due to intra-abdominal source, POA Severe intestinal pneumatosis with ischemic vs infectious colitis/ileus General surgery following, continue to recommend conservative management, patient remains n.p.o. with rectal tube and NG tube for decompression.  Abdominal x-ray 01/25/2024 shows diffuse gaseous distention of colon.  Patient required vasopressors but has been off of vasopressors since 01/24/2024.  Management per general surgery.   Possible aspiration pneumonia: Chest x-ray shows bibasilar airspace opacities, patient on Zosyn , doing well, not hypoxic.   Nonoliguric AKI due to Septic ATN: Had normal creatinine and was discharged with creatinine of 0.76 on 01/19/2024.  Presented with creatinine of 2.55, likely due to ATN in the setting of septic shock.  Creatinine actually improved to 1.46 but worse to 1.99 today.  Has sluggish urine output.  Blood pressure fairly stable.  Monitor and treat underlying cause.  Avoid nephrotoxic agents.  Severe hypokalemia: 2.7.  60 mEq KCl replenishment has been ordered by critical care team.  Monitor on telemetry.  Repeat potassium later today and replenish as  needed.   CAD w/ LAD drug eluting stent/Chronic combined systolic and diastolic CHF: Echo on 01/13/2024 shows 25 to 30% ejection fraction with grade 1 diastolic dysfunction.  PTA medications include Lasix , Toprol -XL, Aldactone  and Entresto .  All of them on hold due to low blood pressure and AKI.  Patient unable to take p.o. medications, for which he has been transition to cangrelor  in the meanwhile.  Cardiology following and managing.  Appreciate their help.  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  Went into atrial fibrillation around 4 AM on 01/24/2024, started on IV amiodarone .  Currently remains on amiodarone  and cardiology managing.  Cardiology and general surgery avoiding IV heparin  due to high risk of bleeding while he is on cangrelor .  Mild hyponatremia: Stable.  Patient asymptomatic.  Leukopenia: Was suspected to be secondary to sepsis.  Resolved.  DVT prophylaxis: heparin  injection 5,000 Units Start: 01/23/24 1400 Place and maintain sequential compression device Start: 01/22/24 1650 SCDs Start: 01/22/24 1548   Code Status: Full Code  Family Communication:  None present at bedside however he called his wife who was on the speaker phone during the whole encounter.  Plan of care discussed with patient in length and he/she verbalized understanding and agreed with it.  Status is: Inpatient Remains inpatient appropriate because: Still with NG tube and NPO.   Estimated body mass index is 27.27 kg/m as calculated from the following:   Height as of 01/08/24: 6' (1.829 m).   Weight as of this encounter: 91.2 kg.    Nutritional Assessment: Body mass index is 27.27 kg/m.[name] Seen by dietician.  I agree with the assessment and plan as outlined below: Nutrition Status:        .  Skin Assessment: I have examined the patient's skin and I agree with the wound assessment as performed by the wound care RN as outlined below:    Consultants:   General Surgery and cardiology  Procedures:  As above  Antimicrobials:  Anti-infectives (From admission, onward)    Start     Dose/Rate Route Frequency Ordered Stop   01/22/24 2200  piperacillin -tazobactam (ZOSYN ) IVPB 3.375 g        3.375 g 12.5 mL/hr over 240 Minutes Intravenous Every 8 hours 01/22/24 1602     01/22/24 1800  metroNIDAZOLE  (FLAGYL ) IVPB 500 mg  Status:  Discontinued        500 mg 100 mL/hr over 60 Minutes Intravenous Every 8 hours 01/22/24 1653 01/23/24 0857   01/22/24 1545  metroNIDAZOLE  (FLAGYL ) IVPB 500 mg  Status:  Discontinued        500 mg 100 mL/hr over 60 Minutes Intravenous  Once 01/22/24 1533 01/22/24 1634   01/22/24 1330  doxycycline (VIBRAMYCIN) 100 mg in sodium chloride  0.9 % 250 mL IVPB  Status:  Discontinued        100 mg 125 mL/hr over 120 Minutes Intravenous  Once 01/22/24 1320 01/22/24 1649   01/22/24 1315  azithromycin (ZITHROMAX) 500 mg in sodium chloride  0.9 % 250 mL IVPB  Status:  Discontinued        500 mg 250 mL/hr over 60 Minutes Intravenous  Once 01/22/24 1309 01/22/24 1319   01/22/24 1315  ceFEPIme  (MAXIPIME ) 2 g in sodium chloride  0.9 % 100 mL IVPB        2 g 200 mL/hr over 30 Minutes Intravenous  Once 01/22/24 1309 01/22/24 1515   01/22/24 1315  vancomycin  (VANCOREADY) IVPB 1750 mg/350 mL  Status:  Discontinued        1,750 mg 175 mL/hr over 120 Minutes Intravenous  Once 01/22/24 1311 01/22/24 1756         Subjective: Patient seen and examined, fully alert and awake.  Complains of intermittent nausea but no vomiting.  No abdominal pain and also endorsed passing flatus earlier today.  No new complaint.  Objective: Vitals:   01/26/24 0600 01/26/24 0601 01/26/24 0615 01/26/24 0757  BP: (!) 116/54     Pulse: 86 70 75   Resp: 13 16 13    Temp:    97.7 F (36.5 C)  TempSrc:    Oral  SpO2: 94% 95% 95%   Weight:        Intake/Output Summary (Last 24 hours) at 01/26/2024 0824 Last data filed at 01/26/2024 0600 Gross per 24 hour   Intake 1102.14 ml  Output 1720 ml  Net -617.86 ml   Filed Weights   01/24/24 0423 01/25/24 0500 01/26/24 0500  Weight: 92.1 kg 94.1 kg 91.2 kg    Examination:  General exam: Appears calm and comfortable with NG tube in place. Respiratory system: Clear to auscultation. Respiratory effort normal. Cardiovascular system: S1 & S2 heard, irregularly irregular RR. No JVD, murmurs, rubs, gallops or clicks. No pedal edema. Gastrointestinal system: Abdomen is nondistended, soft and nontender. No organomegaly or masses felt. Normal bowel sounds heard. Central nervous system: Alert and oriented. No focal neurological deficits. Extremities: Symmetric 5 x 5 power. Skin: No rashes, lesions or ulcers Psychiatry: Judgement and insight appear normal. Mood & affect appropriate.    Data Reviewed: I have personally reviewed following labs and imaging studies  CBC: Recent Labs  Lab 01/22/24 1243 01/22/24 1259 01/23/24 1111 01/24/24 0336 01/25/24 0752 01/26/24 0439  WBC  2.4*  --  4.4 4.8 4.7 5.5  NEUTROABS 1.7  --   --   --   --   --   HGB 13.9 15.3 11.6* 11.2* 10.7* 10.7*  HCT 43.2 45.0 36.0* 35.4* 33.7* 33.2*  MCV 88.5  --  88.9 90.1 89.4 88.5  PLT 489*  --  355 344 297 299   Basic Metabolic Panel: Recent Labs  Lab 01/19/24 1129 01/22/24 1243 01/22/24 2159 01/23/24 1111 01/24/24 0755 01/24/24 2221 01/25/24 0752 01/26/24 0439  NA 133*   < > 135 133* 138 133* 132* 134*  K 3.5   < > 2.6* 3.0* 3.3* 3.2* 4.6 2.7*  CL 88*   < > 91* 96* 98 98 98 102  CO2 34*   < > 29 26 26 23  20* 22  GLUCOSE 169*   < > 137* 106* 127* 104* 176* 92  BUN 21   < > 69* 58* 56* 61* 62* 68*  CREATININE 0.76   < > 2.14* 1.50* 1.46* 1.94* 1.96* 1.99*  CALCIUM  8.5*   < > 7.8* 7.6* 8.3* 7.6* 7.5* 7.6*  MG 2.2  --  2.1 2.0  --   --   --   --    < > = values in this interval not displayed.   GFR: Estimated Creatinine Clearance: 34.7 mL/min (A) (by C-G formula based on SCr of 1.99 mg/dL (H)). Liver Function  Tests: Recent Labs  Lab 01/22/24 1243 01/25/24 0752 01/26/24 0439  AST 29 17 14*  ALT 31 24 21   ALKPHOS 64 55 52  BILITOT 1.4* 0.8 0.9  PROT 6.2* 4.3* 4.4*  ALBUMIN 3.0* 1.7* 1.7*   No results for input(s): LIPASE, AMYLASE in the last 168 hours. No results for input(s): AMMONIA in the last 168 hours. Coagulation Profile: No results for input(s): INR, PROTIME in the last 168 hours. Cardiac Enzymes: No results for input(s): CKTOTAL, CKMB, CKMBINDEX, TROPONINI in the last 168 hours. BNP (last 3 results) Recent Labs    01/07/24 2301  PROBNP 15,245.0*   HbA1C: No results for input(s): HGBA1C in the last 72 hours. CBG: Recent Labs  Lab 01/25/24 1532 01/25/24 1936 01/25/24 2326 01/26/24 0328 01/26/24 0755  GLUCAP 102* 99 87 76 102*   Lipid Profile: No results for input(s): CHOL, HDL, LDLCALC, TRIG, CHOLHDL, LDLDIRECT in the last 72 hours. Thyroid  Function Tests: No results for input(s): TSH, T4TOTAL, FREET4, T3FREE, THYROIDAB in the last 72 hours. Anemia Panel: No results for input(s): VITAMINB12, FOLATE, FERRITIN, TIBC, IRON, RETICCTPCT in the last 72 hours. Sepsis Labs: Recent Labs  Lab 01/22/24 1243 01/22/24 1300 01/22/24 1500 01/24/24 1002 01/24/24 1857  PROCALCITON 1.23  --   --   --   --   LATICACIDVEN  --  3.4* 1.9 1.1 1.1    Recent Results (from the past 240 hours)  Culture, blood (routine x 2)     Status: None (Preliminary result)   Collection Time: 01/22/24 12:43 PM   Specimen: BLOOD RIGHT ARM  Result Value Ref Range Status   Specimen Description BLOOD RIGHT ARM  Final   Special Requests   Final    BOTTLES DRAWN AEROBIC AND ANAEROBIC Blood Culture adequate volume   Culture   Final    NO GROWTH 4 DAYS Performed at [facility], [address] [place], [place] [address]    Report Status PENDING  Incomplete  Culture, blood (routine x 2)     Status: None (Preliminary result)   Collection  Time: 01/22/24 12:48 PM   Specimen: BLOOD RIGHT HAND  Result Value Ref Range Status   Specimen Description BLOOD RIGHT HAND  Final   Special Requests   Final    BOTTLES DRAWN AEROBIC ONLY Blood Culture results may not be optimal due to an inadequate volume of blood received in culture bottles   Culture   Final    NO GROWTH 4 DAYS Performed at [facility], [address], [place], [place] [address]    Report Status PENDING  Incomplete  C Difficile Quick Screen w PCR reflex     Status: None   Collection Time: 01/22/24  1:10 PM   Specimen: STOOL  Result Value Ref Range Status   C Diff antigen NEGATIVE NEGATIVE Final   C Diff toxin NEGATIVE NEGATIVE Final   C Diff interpretation No C. difficile detected.  Final    Comment: Performed at [facility], [address] [place], [place] [address]  MRSA Next Gen by PCR, Nasal     Status: None   Collection Time: 01/22/24  1:10 PM   Specimen: Nasal Mucosa; Nasal Swab  Result Value Ref Range Status   MRSA by PCR Next Gen NOT DETECTED NOT DETECTED Final    Comment: (NOTE) The GeneXpert MRSA Assay (FDA approved for NASAL specimens only), is one component of a comprehensive MRSA colonization surveillance program. It is not intended to diagnose MRSA infection nor to guide or monitor treatment for MRSA infections. Test performance is not FDA approved in patients less than 52 years old. Performed at [facility], [address], [place], [place] [address]      Radiology Studies: DG Abd 1 View Result Date: 01/25/2024 CLINICAL DATA:  Pneumatosis intestinalis EXAM: ABDOMEN - 1 VIEW COMPARISON:  Abdominal radiograph dated 01/24/2024. FINDINGS: Evaluation is limited due to body habitus. An enteric tube with tip over the epigastric area in the proximal stomach. Diffuse gaseous distension of the colon relatively similar to prior radiograph. No definite free air. No acute osseous pathology. IMPRESSION: 1. Enteric tube with tip over the  proximal stomach. 2. Diffuse gaseous distension of the colon. Electronically Signed   By: Vanetta Chou M.D.   On: 01/25/2024 14:37   DG Abd Portable 1V Result Date: 01/24/2024 EXAM: 1 VIEW XRAY OF THE ABDOMEN 01/24/2024 01:00:00 PM COMPARISON: 01/24/2024 CLINICAL HISTORY: Encounter for imaging study to confirm orogastric (OG) tube placement FINDINGS: BOWEL: Diffuse gaseous distension of the colon, unchanged. SOFT TISSUES: No opaque urinary calculi. Enteric tube in place with tip and side port projecting over the expected region of the gastric fundus. BONES: No acute osseous abnormality. IMPRESSION: 1. Enteric tube in place with tip and side port projecting over the expected region of the gastric fundus. 2. Diffuse gaseous distension of the colon, unchanged, presumably colonic ileus. Electronically signed by: Selinda Blue MD 01/24/2024 01:42 PM EDT RP Workstation: [id number]   DG Chest Port 1 View Result Date: 01/24/2024 EXAM: 1 VIEW XRAY OF THE CHEST 01/24/2024 01:00:00 PM COMPARISON: 01/22/2024 CLINICAL HISTORY: S/P PICC central line placement FINDINGS: LUNGS AND PLEURA: Low lung volumes. Elevated right hemidiaphragm. Bibasilar airspace opacities, likely atelectasis. Trace bilateral pleural effusions. HEART AND MEDIASTINUM: No acute abnormality of the cardiac and mediastinal silhouettes. BONES AND SOFT TISSUES: Advanced degenerative changes of right shoulder. LINES AND TUBES: Left upper extremity PICC with tip overlying the cavoatrial junction. Enteric tube courses into the stomach below the edge of the image with tip not seen. IMPRESSION: 1. Left  PICC tip overlying the cavoatrial junction. 2. Low lung volumes, elevated right hemidiaphragm, and bibasilar airspace opacities, likely atelectasis. 3. Trace bilateral pleural effusions. Electronically signed by: Selinda Blue MD 01/24/2024 01:41 PM EDT RP Workstation: [id number]   US  EKG SITE RITE Result Date: 01/24/2024 If Site Rite image not attached, placement  could not be confirmed due to current cardiac rhythm.  DG Abd 1 View Result Date: 01/24/2024 CLINICAL DATA:  Ileus. EXAM: ABDOMEN - 1 VIEW COMPARISON:  11/29/2023.  CT scan 01/22/2024. FINDINGS: Marked gaseous distention of colon identified, similar to the scout film from yesterday's CT scan. Fiducial markers overlie the prostate region. IMPRESSION: Marked gaseous distention of colon, similar to the scout film from yesterday's CT scan. Electronically Signed   By: Camellia Candle M.D.   On: 01/24/2024 09:29    Scheduled Meds:  Chlorhexidine  Gluconate Cloth  6 each Topical Daily   heparin  injection (subcutaneous)  5,000 Units Subcutaneous Q8H   insulin  aspart  0-9 Units Subcutaneous Q4H   sodium chloride  flush  10-40 mL Intracatheter Q12H   Continuous Infusions:  amiodarone  30 mg/hr (01/26/24 0754)   cangrelor  (KENGREAL ) 50 mg in sodium chloride  0.9 % 250 mL (0.2 mg/mL) infusion 0.75 mcg/kg/min (01/26/24 0600)   norepinephrine  (LEVOPHED ) Adult infusion Stopped (01/24/24 1737)   piperacillin -tazobactam 12.5 mL/hr at 01/26/24 0600   potassium chloride        LOS: 4 days   Fredia Skeeter, MD [facility]  01/26/2024, 8:24 AM   *Please note that this is a verbal dictation therefore any spelling or grammatical errors are due to the Dragon Medical One system interpretation.  Please page via Amion and do not message via secure chat for urgent patient care matters. Secure chat can be used for non urgent patient care matters.  How to contact the [facility] Attending or Consulting provider 7A - 7P or covering provider during after hours 7P -7A, for this patient?  Check the care team in [facility] and look for a) attending/consulting [facility] provider listed and b) the [facility] team listed. Page or secure chat 7A-7P. Log into [email or web address] and use [facility] universal password to access. If you do not have the password, please contact the hospital operator. Locate the [facility] provider you are looking for under [facility] and page to a number that you can be directly reached. If you still have difficulty reaching the provider, please page the [facility] (Director on Call) for the Hospitalists listed on amion for assistance.

## 2024-01-26 NOTE — Progress Notes (Signed)
 Order to remove pt's NGT. Pt refused to let nurse remove. Pt states  I would like to keep it in, until morning. I am scared I will start vomiting, once I start to eat. I just want to get through the night.[name]

## 2024-01-26 NOTE — Progress Notes (Signed)
 Intestinal ischemia (HCC)  Subjective: Awake and alert. Rates abdominal pain as 2/10. Reports phlegm overnight.   Afebrile, HR 90's this morning WBC 5.5 Hgb 10.7 (from 10.7) K 2.7 this AM  Objective: Vital signs in last 24 hours: Temp:  [96.6 F (35.9 C)-98.1 F (36.7 C)] 96.6 F (35.9 C) (09/03 1202) Pulse Rate:  [56-168] 81 (09/03 1000) Resp:  [12-24] 16 (09/03 1000) BP: (90-144)/(54-103) 134/56 (09/03 1000) SpO2:  [91 %-100 %] 97 % (09/03 1000) FiO2 (%):  [40 %] 40 % (09/03 1000) Weight:  [91.2 kg] 91.2 kg (09/03 0500) Last BM Date : 01/24/24  Intake/Output from previous day: 09/02 0701 - 09/03 0700 In: 1155.1 [P.O.:100; I.V.:833.4; IV Piggyback:221.7] Out: 1720 [Urine:520; Emesis/NG output:650; Stool:550] Intake/Output this shift: Total I/O In: 260.5 [I.V.:145.8; IV Piggyback:114.8] Out: -   Gen: alert, cooperative NAD CV: RRR Pulm: normal effort  Abd: soft, mild to moderate distention - improved compared to yetserday's exam, mild TTP SP region and RLQ, no guarding, rectal pouch in place draining non-bloody liquid stool. NGT in place with minimal output    NGT - 650 mL/24h  Rectal pouch - 550 mL/24h   Lab Results:  Results for orders placed or performed during the hospital encounter of 01/22/24 (from the past 24 hours)  Glucose, capillary     Status: Abnormal   Collection Time: 01/25/24  3:32 PM  Result Value Ref Range   Glucose-Capillary 102 (H) 70 - 99 mg/dL  Glucose, capillary     Status: None   Collection Time: 01/25/24  7:36 PM  Result Value Ref Range   Glucose-Capillary 99 70 - 99 mg/dL  Glucose, capillary     Status: None   Collection Time: 01/25/24 11:26 PM  Result Value Ref Range   Glucose-Capillary 87 70 - 99 mg/dL  Glucose, capillary     Status: None   Collection Time: 01/26/24  3:28 AM  Result Value Ref Range   Glucose-Capillary 76 70 - 99 mg/dL  CBC     Status: Abnormal   Collection Time: 01/26/24  4:39 AM  Result Value Ref Range   WBC 5.5  4.0 - 10.5 K/uL   RBC 3.75 (L) 4.22 - 5.81 MIL/uL   Hemoglobin 10.7 (L) 13.0 - 17.0 g/dL   HCT 66.7 (L) 60.9 - 47.9 %   MCV 88.5 80.0 - 100.0 fL   MCH 28.5 26.0 - 34.0 pg   MCHC 32.2 30.0 - 36.0 g/dL   RDW 83.1 (H) 88.4 - 84.4 %   Platelets 299 150 - 400 K/uL   nRBC 0.0 0.0 - 0.2 %  Comprehensive metabolic panel with GFR     Status: Abnormal   Collection Time: 01/26/24  4:39 AM  Result Value Ref Range   Sodium 134 (L) 135 - 145 mmol/L   Potassium 2.7 (LL) 3.5 - 5.1 mmol/L   Chloride 102 98 - 111 mmol/L   CO2 22 22 - 32 mmol/L   Glucose, Bld 92 70 - 99 mg/dL   BUN 68 (H) 8 - 23 mg/dL   Creatinine, Ser 8.00 (H) 0.61 - 1.24 mg/dL   Calcium  7.6 (L) 8.9 - 10.3 mg/dL   Total Protein 4.4 (L) 6.5 - 8.1 g/dL   Albumin 1.7 (L) 3.5 - 5.0 g/dL   AST 14 (L) 15 - 41 U/L   ALT 21 0 - 44 U/L   Alkaline Phosphatase 52 38 - 126 U/L   Total Bilirubin 0.9 0.0 - 1.2 mg/dL   GFR, Estimated  34 (L) >60 mL/min   Anion gap 10 5 - 15  Magnesium      Status: Abnormal   Collection Time: 01/26/24  4:39 AM  Result Value Ref Range   Magnesium  3.0 (H) 1.7 - 2.4 mg/dL  Phosphorus     Status: None   Collection Time: 01/26/24  4:39 AM  Result Value Ref Range   Phosphorus 3.3 2.5 - 4.6 mg/dL  Glucose, capillary     Status: Abnormal   Collection Time: 01/26/24  7:55 AM  Result Value Ref Range   Glucose-Capillary 102 (H) 70 - 99 mg/dL  Glucose, capillary     Status: None   Collection Time: 01/26/24 12:03 PM  Result Value Ref Range   Glucose-Capillary 91 70 - 99 mg/dL     Studies/Results Radiology     MEDS, Scheduled  Chlorhexidine  Gluconate Cloth  6 each Topical Daily   heparin  injection (subcutaneous)  5,000 Units Subcutaneous Q8H   insulin  aspart  0-9 Units Subcutaneous Q4H   potassium chloride   40 mEq Per Tube Once   sodium chloride  flush  10-40 mL Intracatheter Q12H     Assessment: Intestinal ischemia (HCC) Colonic ileus, possible ischemic colitis in the setting of recent cardiac stent  to LAD on Brilinta  (held)   Plan: Clinically improving with less abdominal pain and distention  NGT has been in place for 48 hours and is low output; clamp trial. Possibly remove and start liquid diet today. Has a rectal pouch in place  Cont IV Zosyn  On IV cangrelor  right now for stent  SQH for DVT ppx, Agree with cardiology and would not start heparin  gtt at this time due to high bleeding risk in combination with IV antiplatelet therapy and colitis. No clear indication for surgery at this time, appears to be adynamic ileus of the colon that is slowly improving. Clamp NGT and consider removal/CLD this afternoon.  Per primary team --  AKI  CAD s/p DES to LAD - IV cangrelor   Afib/RVR - amio gtt  Chronic systolic CHF Leukopenia - WBC 4    LOS: 4 days    Almarie Pringle, [facility] Please see Amion for pager number during day hours 7:00am-4:30pm       Patient's medical decision making was moderate    01/26/2024 12:33 PM

## 2024-01-26 NOTE — Progress Notes (Signed)
 eLink Physician-Brief Progress Note Patient Name: Chase Combs DOB: [birth year] MRN: [id number]   Date of Service  01/26/2024  HPI/Events of Note  K2.7  eICU Interventions  Kcl     Intervention Category Minor Interventions: Electrolytes abnormality - evaluation and management  Ajit Errico 01/26/2024, 6:37 AM

## 2024-01-26 NOTE — Care Management Important Message (Signed)
 Important Message  Patient Details  Name: Chase Combs MRN: [id number] Date of Birth: 06-04-1945   Important Message Given:  Yes - Medicare IM     Claretta Deed 01/26/2024, 4:15 PM

## 2024-01-26 NOTE — Plan of Care (Signed)
  Problem: Clinical Measurements: Goal: Ability to maintain clinical measurements within normal limits will improve Outcome: Progressing   Problem: Clinical Measurements: Goal: Will remain free from infection Outcome: Progressing   Problem: Clinical Measurements: Goal: Diagnostic test results will improve Outcome: Progressing   Problem: Clinical Measurements: Goal: Respiratory complications will improve Outcome: Progressing   Problem: Clinical Measurements: Goal: Cardiovascular complication will be avoided Outcome: Progressing   Problem: Activity: Goal: Risk for activity intolerance will decrease Outcome: Progressing   Problem: Elimination: Goal: Will not experience complications related to bowel motility Outcome: Progressing   Problem: Elimination: Goal: Will not experience complications related to urinary retention Outcome: Progressing   Problem: Safety: Goal: Ability to remain free from injury will improve Outcome: Progressing   Problem: Skin Integrity: Goal: Risk for impaired skin integrity will decrease Outcome: Progressing   Problem: Tissue Perfusion: Goal: Adequacy of tissue perfusion will improve Outcome: Progressing   Problem: Nutritional: Goal: Maintenance of adequate nutrition will improve Outcome: Progressing    Plan of care, assessment, monitoring, treatment, and intervention (s) ongoing, see  MAR see flowsheet

## 2024-01-26 NOTE — Progress Notes (Signed)
    Brief Note --> Rates notably improved on Amiodarone  Remains on IV Kangreal until able to take PO meds.   Will continue to follow along peripherally.  Alm MICAEL Clay, MD, MS Alm Clay, M.D., M.S. Interventional Cardiologist  [facility] Pager # [phone]

## 2024-01-26 NOTE — TOC CM/SW Note (Signed)
 Transition of Care [facility]) - Inpatient Brief Assessment   Patient Details  Name: Chase Combs MRN: [id number] Date of Birth: October 28, 1945  Transition of Care [facility]) CM/SW Contact:    Tom-Johnson, Harvest Muskrat, RN Phone Number: 01/26/2024, 12:58 PM   Clinical Narrative:  Patient presented to the ED with worsening Generalized Weakness and Emesis and Hypotension in the 60's. Patient recently had Cardiac Stent placed on 01/17/24. Patient was started on IV abx. CT showed Air in the wall of the Intestine and concerning for ileus. Admitted with Pneumatosis Intestinalis/Ileus, General Sx following, recommended Conservative Management. Patient continues NGT and Rectal Tube for Decompression. Cardiology consulted for PCI, patient went into new A-Fib on 01/24/24 and started on IV Amiodarone .    CM spoke with patient at bedside and wife, [name] via phone. Has three sisters and a supportive sister. Modified independent, has all necessary DME's at bedside. Able to drive self.  PCP is Tur[place], Worth SQUIBB, PA and uses [facility] on [address]   No ICM needs or recommendations noted at this time.  Patient not Medically ready for discharge.  CM will continue to follow as patient progresses with care towards discharge.            Transition of Care Asessment: Insurance and Status: Insurance coverage has been reviewed Patient has primary care physician: Yes Home environment has been reviewed: Yes Prior level of function:: Modified Indeendent Prior/Current Home Services: No current home services Social Drivers of Health Review: SDOH reviewed no interventions necessary Readmission risk has been reviewed: Yes Transition of care needs: no transition of care needs at this time

## 2024-01-27 DIAGNOSIS — K559 Vascular disorder of intestine, unspecified: Secondary | ICD-10-CM | POA: Diagnosis not present

## 2024-01-27 LAB — GLUCOSE, CAPILLARY
Glucose-Capillary: 138 mg/dL — ABNORMAL HIGH (ref 70–99)
Glucose-Capillary: 147 mg/dL — ABNORMAL HIGH (ref 70–99)
Glucose-Capillary: 189 mg/dL — ABNORMAL HIGH (ref 70–99)
Glucose-Capillary: 201 mg/dL — ABNORMAL HIGH (ref 70–99)
Glucose-Capillary: 202 mg/dL — ABNORMAL HIGH (ref 70–99)

## 2024-01-27 LAB — CBC
HCT: 31.1 % — ABNORMAL LOW (ref 39.0–52.0)
Hemoglobin: 10.2 g/dL — ABNORMAL LOW (ref 13.0–17.0)
MCH: 28.7 pg (ref 26.0–34.0)
MCHC: 32.8 g/dL (ref 30.0–36.0)
MCV: 87.6 fL (ref 80.0–100.0)
Platelets: 298 K/uL (ref 150–400)
RBC: 3.55 MIL/uL — ABNORMAL LOW (ref 4.22–5.81)
RDW: 17.2 % — ABNORMAL HIGH (ref 11.5–15.5)
WBC: 6.2 K/uL (ref 4.0–10.5)
nRBC: 0 % (ref 0.0–0.2)

## 2024-01-27 LAB — COMPREHENSIVE METABOLIC PANEL WITH GFR
ALT: 20 U/L (ref 0–44)
AST: 14 U/L — ABNORMAL LOW (ref 15–41)
Albumin: 1.8 g/dL — ABNORMAL LOW (ref 3.5–5.0)
Alkaline Phosphatase: 59 U/L (ref 38–126)
Anion gap: 16 — ABNORMAL HIGH (ref 5–15)
BUN: 47 mg/dL — ABNORMAL HIGH (ref 8–23)
CO2: 21 mmol/L — ABNORMAL LOW (ref 22–32)
Calcium: 7.5 mg/dL — ABNORMAL LOW (ref 8.9–10.3)
Chloride: 103 mmol/L (ref 98–111)
Creatinine, Ser: 1.46 mg/dL — ABNORMAL HIGH (ref 0.61–1.24)
GFR, Estimated: 50 mL/min — ABNORMAL LOW (ref >60)
Glucose, Bld: 164 mg/dL — ABNORMAL HIGH (ref 70–99)
Potassium: 3.3 mmol/L — ABNORMAL LOW (ref 3.5–5.1)
Sodium: 140 mmol/L (ref 135–145)
Total Bilirubin: 0.9 mg/dL (ref 0.0–1.2)
Total Protein: 5.4 g/dL — ABNORMAL LOW (ref 6.5–8.1)

## 2024-01-27 LAB — CULTURE, BLOOD (ROUTINE X 2)
Culture: NO GROWTH
Culture: NO GROWTH
Special Requests: ADEQUATE

## 2024-01-27 MED ORDER — POTASSIUM CHLORIDE 20 MEQ PO PACK
40.0000 meq | PACK | Freq: Two times a day (BID) | ORAL | Status: AC
  Administered 2024-01-27 (×2): 40 meq via ORAL
  Filled 2024-01-27 (×2): qty 2

## 2024-01-27 MED ORDER — POTASSIUM CHLORIDE CRYS ER 20 MEQ PO TBCR
40.0000 meq | EXTENDED_RELEASE_TABLET | Freq: Two times a day (BID) | ORAL | Status: DC
  Filled 2024-01-27: qty 2

## 2024-01-27 MED ORDER — POTASSIUM CHLORIDE 10 MEQ/50ML IV SOLN
10.0000 meq | INTRAVENOUS | Status: AC
  Administered 2024-01-27 (×6): 10 meq via INTRAVENOUS
  Filled 2024-01-27 (×6): qty 50

## 2024-01-27 MED ORDER — BOOST / RESOURCE BREEZE PO LIQD CUSTOM
1.0000 | Freq: Three times a day (TID) | ORAL | Status: DC
  Administered 2024-01-27 – 2024-01-31 (×5): 1 via ORAL

## 2024-01-27 NOTE — Progress Notes (Signed)
 Intestinal ischemia (HCC)  Subjective: Sleeping comfortably, initially confused/dreaming but A&Ox4. Denies abdominal pain.  Ongoing bowel movements that are nonbloody with rectal pouch in place.  NG tube removed overnight, tolerating sips and ice chips.  Objective: Vital signs in last 24 hours: Temp:  [96.6 F (35.9 C)-99 F (37.2 C)] 97.9 F (36.6 C) (09/04 0740) Pulse Rate:  [60-158] 97 (09/04 0500) Resp:  [14-33] 17 (09/04 0500) BP: (112-162)/(54-92) 151/92 (09/04 0500) SpO2:  [90 %-99 %] 97 % (09/04 0500) Last BM Date : 01/26/24  Intake/Output from previous day: 09/03 0701 - 09/04 0700 In: 1470.7 [P.O.:170; I.V.:837.8; IV Piggyback:462.9] Out: 1395 [Urine:1195; Emesis/NG output:100; Stool:100] Intake/Output this shift: No intake/output data recorded.  Gen: alert, cooperative NAD CV: RRR Pulm: normal effort  Abd: soft, mild to moderate distention, overall nontender, no rebound tenderness or guarding Rectal pouch - 100 mL/24h   Lab Results:  Results for orders placed or performed during the hospital encounter of 01/22/24 (from the past 24 hours)  Glucose, capillary     Status: None   Collection Time: 01/26/24 12:03 PM  Result Value Ref Range   Glucose-Capillary 91 70 - 99 mg/dL  Glucose, capillary     Status: Abnormal   Collection Time: 01/26/24  3:34 PM  Result Value Ref Range   Glucose-Capillary 122 (H) 70 - 99 mg/dL  Potassium     Status: None   Collection Time: 01/26/24  6:32 PM  Result Value Ref Range   Potassium 3.7 3.5 - 5.1 mmol/L  Glucose, capillary     Status: Abnormal   Collection Time: 01/26/24  7:20 PM  Result Value Ref Range   Glucose-Capillary 100 (H) 70 - 99 mg/dL  Glucose, capillary     Status: Abnormal   Collection Time: 01/26/24 11:18 PM  Result Value Ref Range   Glucose-Capillary 104 (H) 70 - 99 mg/dL  Glucose, capillary     Status: Abnormal   Collection Time: 01/27/24  3:12 AM  Result Value Ref Range   Glucose-Capillary 138 (H) 70 - 99  mg/dL  CBC     Status: Abnormal   Collection Time: 01/27/24  5:39 AM  Result Value Ref Range   WBC 6.2 4.0 - 10.5 K/uL   RBC 3.55 (L) 4.22 - 5.81 MIL/uL   Hemoglobin 10.2 (L) 13.0 - 17.0 g/dL   HCT 68.8 (L) 60.9 - 47.9 %   MCV 87.6 80.0 - 100.0 fL   MCH 28.7 26.0 - 34.0 pg   MCHC 32.8 30.0 - 36.0 g/dL   RDW 82.7 (H) 88.4 - 84.4 %   Platelets 298 150 - 400 K/uL   nRBC 0.0 0.0 - 0.2 %  Comprehensive metabolic panel with GFR     Status: Abnormal   Collection Time: 01/27/24  5:39 AM  Result Value Ref Range   Sodium 140 135 - 145 mmol/L   Potassium 3.3 (L) 3.5 - 5.1 mmol/L   Chloride 103 98 - 111 mmol/L   CO2 21 (L) 22 - 32 mmol/L   Glucose, Bld 164 (H) 70 - 99 mg/dL   BUN 47 (H) 8 - 23 mg/dL   Creatinine, Ser 8.53 (H) 0.61 - 1.24 mg/dL   Calcium  7.5 (L) 8.9 - 10.3 mg/dL   Total Protein 5.4 (L) 6.5 - 8.1 g/dL   Albumin 1.8 (L) 3.5 - 5.0 g/dL   AST 14 (L) 15 - 41 U/L   ALT 20 0 - 44 U/L   Alkaline Phosphatase 59 38 - 126 U/L  Total Bilirubin 0.9 0.0 - 1.2 mg/dL   GFR, Estimated 50 (L) >60 mL/min   Anion gap 16 (H) 5 - 15  Glucose, capillary     Status: Abnormal   Collection Time: 01/27/24  7:36 AM  Result Value Ref Range   Glucose-Capillary 147 (H) 70 - 99 mg/dL     Studies/Results Radiology     MEDS, Scheduled  Chlorhexidine  Gluconate Cloth  6 each Topical Daily   heparin  injection (subcutaneous)  5,000 Units Subcutaneous Q8H   insulin  aspart  0-9 Units Subcutaneous Q4H   sodium chloride  flush  10-40 mL Intracatheter Q12H     Assessment: Intestinal ischemia (HCC) Colonic ileus, possible ischemic colitis in the setting of recent cardiac stent to LAD on Brilinta  (held)   Plan: -  Adynamic ileus is clinically improving nonoperatively with less abdominal pain and distention  -  Tolerated NG clamp trial 9/3, NG tube removed, on clear liquid diet; if unable to advance diet tomorrow will plan to start -  -  TPN, he already has a left upper extremity PICC line in  place. -  Has a rectal pouch in place  -  On IV cangrelor  right now for stent  -  SQH for DVT ppx, hemoglobin stable at 10.2 from 10.7.  I will discuss timing of resumption of p.o. antiplatelet therapy and, if necessary, oral anticoagulation with my attending.  -  On IV Zosyn  for possible aspiration pneumonia  Per primary team --  AKI  CAD s/p DES to LAD - IV cangrelor   Afib/RVR - amio gtt  Chronic systolic CHF Leukopenia - WBC 4    LOS: 5 days    Almarie Pringle, [facility] Please see Amion for pager number during day hours 7:00am-4:30pm  Patient's medical decision making was moderate    01/27/2024 8:19 AM

## 2024-01-27 NOTE — Progress Notes (Signed)
 Initial Nutrition Assessment  DOCUMENTATION CODES:   Non-severe (moderate) malnutrition in context of acute illness/injury - although he meets criteria for malnutrition in the context of acute illness, likely has underlying chronic contributors to nutrition-related decline (i.e reported inactivity, multiple recurrent hospitalizations, and compromised cardiac status and likely prolonged inadequate energy intake r/t increased needs)  INTERVENTION:  Monitor diet advancement/tolerance to assess need for nutrition support  If TPN indicated- titrate to meet estimated nutrition needs.  Kcal: 1900-2100 kcals Protein: 95-110g   Add MVI w/ minerals Thiamine  100mg  x5 days  Monitor magnesium , potassium, and phosphorus BID for at least 3 days s/p initiation of nutrition support, MD to replete as needed, as pt is at risk for refeeding   Add Boost Breeze po TID, each supplement provides 250 kcal and 9 grams of protein   When diet advanced, transition to Boost Plus (vanilla) po TID, each supplement provides 360 kcal and 14 grams of protein   NUTRITION DIAGNOSIS:  Moderate Malnutrition related to acute illness as evidenced by energy intake < 75% for > 7 days, moderate muscle depletion, mild muscle depletion.  GOAL:  Patient will meet greater than or equal to 90% of their needs  MONITOR:  PO intake, Supplement acceptance, Diet advancement, I & O's  REASON FOR ASSESSMENT:  NPO/Clear Liquid Diet, Rounds    ASSESSMENT:  Pt with PMH significant for: ischemic cardiomyopathy and recent LAD w/ stent placement for NSTEMI, HTN, arthritis, prostate cancer. Presented to ED with c/o nausea and vomiting. CT c/f intestinal pneumatosis w/ ischemic versus infectious colitis. Admitted w/ septic shock.   8/31 admitted 9/03 transitioned to TRH care; diet advanced to CLD 9/04 NGT removed  Of note, patient with multiple admissions since May when he had knee surgery. Bowels were sluggish to return after that  surgery and he was admitted in mid May (5/18 - 6/04) for opioid induced ileus vs SBO and possible colitis as well as aspiration PNA. Required TPN that admission. Readmitted in August (8/15 - 8/27) for PNA, CHF exacerbation, acute respiratory failure.   He endorses poor intake since 4 days PTA. Does not like the food at the hospital. No issues chewing or swallowing regular texture diet at baseline. Seems to be tolerating clear liquid diet today. Plan to start TPN tomorrow, per surgery, if he does not tolerate. He has had multiple BMs today.   Will add oral nutrition supplement to augment intake. He may not accept, but encouraged him to do so to prevent further loss of lean body mass. He does drink Boost Plus at baseline. If/when he is advanced to full liquid diet or above, add Boost Plus (vanilla).   Admit Weight: 88.1 kg Current Weight: 91.2 kg +generalized non-pitting edema   Reports UBW around 205lbs and last weighed this three weeks ago. Current body weight 200lbs. Per chart review, has shown 6.9% weight loss in last five months, which is not considered clinically significant for the time frame. Some edema noted on exam, however not significant.    Intake/Output Summary (Last 24 hours) at 01/27/2024 1740 Last data filed at 01/27/2024 1700 Gross per 24 hour  Intake 1449.82 ml  Output 1900 ml  Net -450.18 ml    BUN and Crt trending down. Potassium remains low. Repletion ongoing. Sodium has stabilized and blood sugars controlled on current insulin  regimen. Last A1c <7%, however five months ago.  Drains/Lines: L basilic: PICC (placed 9/01) UOP: 1195 ml x24 hours  Meds: SSI Novolog  q4h, KCl BID, IV ABX,  Labs:  Na+ 132>134>140 (wdl) K+ 2.7>3.7>3.3 (wdl) BUN 62>68>47 (H) Crt 8.03>8.00>8.53 (H) CBGs 92-164 x24 hours A1c 6.4 (08/2023)    NUTRITION - FOCUSED PHYSICAL EXAM:  Noted with more pronounced muscle and fat depletions compared to last admission. Even though no significant weight  loss observed, his report of being less active as well as multiple admission over the last four months along with poor PO intake in days leading up to admission, he does meet criteria for non-severe malnutrition in the context of acute illness. It is likely underlying chronic issues are contributing to his nutrition-related decline.  Flowsheet Row Most Recent Value  Orbital Region No depletion  Upper Arm Region Mild depletion  Thoracic and Lumbar Region No depletion  Buccal Region No depletion  Temple Region Mild depletion  Clavicle Bone Region Moderate depletion  Clavicle and Acromion Bone Region Moderate depletion  Scapular Bone Region Moderate depletion  Dorsal Hand Mild depletion  Patellar Region No depletion  Anterior Thigh Region Moderate depletion  Posterior Calf Region Mild depletion  Edema (RD Assessment) Mild  Hair Reviewed  Eyes Reviewed  Mouth Reviewed  Skin Reviewed  Nails Reviewed    Diet Order:   Diet Order             Diet clear liquid Room service appropriate? Yes; Fluid consistency: Thin  Diet effective now            EDUCATION NEEDS:   No education needs have been identified at this time  Skin:  Skin Assessment: Reviewed RN Assessment  Last BM:  9/4 - type 7 x2  Height:  Ht Readings from Last 1 Encounters:  01/08/24 6' (1.829 m)   Weight:  Wt Readings from Last 1 Encounters:  01/26/24 91.2 kg    Ideal Body Weight:  80.9 kg  BMI:  Body mass index is 27.27 kg/m.  Estimated Nutritional Needs:   Kcal:  1900-2100 kcals  Protein:  95-110g  Fluid:  1.9-2.1L/day  Blair Deaner MS, RD, LDN Registered Dietitian Clinical Nutrition RD Inpatient Contact Info in Amion

## 2024-01-27 NOTE — TOC Initial Note (Addendum)
 Transition of Care [facility]) - Initial/Assessment Note    Patient Details  Name: Chase Combs MRN: [id number] Date of Birth: 06-Mar-1946  Transition of [facility]) CM/SW Contact:    Lauraine FORBES Saa, LCSWA Phone Number: 01/27/2024, 9:24 AM  Clinical Narrative:                  9:24 AM Per chart review, patient resides at home with spouse. Patient has a PCP and insurance. Patient does not have SNF history. Patient has HH history with [facility]. Patient has DME (oxygen, rolling walker, rollator, scooter, lift chair) history with [employer]. Patient's preferred pharmacy's are [facility], [facility] [address], and [place] [address] [address]. Per progressions, patient has a PICC. TOC will continue to follow and be available to assist.  Expected Discharge Plan: Home/Self Care Barriers to Discharge: Continued Medical Work up   Patient Goals and CMS Choice            Expected Discharge Plan and Services       Living arrangements for the past 2 months: Single Family Home                                      Prior Living Arrangements/Services Living arrangements for the past 2 months: Single Family Home Lives with:: Spouse Patient language and need for interpreter reviewed:: Yes        Need for Family Participation in Patient Care: No (Comment)   Current home services: DME Criminal Activity/Legal Involvement Pertinent to Current Situation/Hospitalization: No - Comment as needed  Activities of Daily Living      Permission Sought/Granted Permission sought to share information with : Family Supports Permission granted to share information with : No (Contact information on chart)  Share Information with NAME: Juana Montini     Permission granted to share info w Relationship: Spouse  Permission granted to share info w Contact Information: [phone]  Emotional Assessment       Orientation: : Oriented to Self, Oriented to Place, Oriented to  Time, Oriented  to Situation Alcohol / Substance Use: Not Applicable Psych Involvement: No (comment)  Admission diagnosis:  Intestinal ischemia (HCC) [K55.9] Pneumatosis intestinalis of large intestine [K63.89] Sepsis, due to unspecified organism, unspecified whether acute organ dysfunction present [facility]) [A41.9] Patient Active Problem List   Diagnosis Date Noted   Sepsis (HCC) 01/23/2024   Pneumatosis intestinalis of large intestine 01/23/2024   Intestinal ischemia (HCC) 01/22/2024   Acute on chronic systolic CHF (congestive heart failure) (HCC) 01/17/2024   Ischemic cardiomyopathy 01/14/2024   Multifocal pneumonia 01/08/2024   Acute hypoxemic respiratory failure (HCC) 01/08/2024   Acute exacerbation of CHF (congestive heart failure) (HCC) 01/08/2024   Chest pain 01/08/2024   Transaminitis 10/21/2023   Acute diarrhea 10/19/2023   Abnormal finding on GI tract imaging 10/19/2023   Ileus, postoperative (HCC) 10/16/2023   Fever, unspecified 10/16/2023   Leukocytosis 10/16/2023   Hypokalemia 10/16/2023   Hypoxia 10/10/2023   Status post total knee replacement, 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   DOE (dyspnea on exertion) 06/21/2023   Atypical angina (HCC) 06/21/2023   Episode of dizziness 06/13/2022   Preop cardiovascular exam 07/03/2021   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(HCC) 06/17/2021   Hyperglycemia, unspecified 02/15/2020   Fatigue due to treatment 09/24/2017   Polypharmacy 06/10/2014   Hyperlipidemia associated with type 2 diabetes mellitus (HCC) 03/03/2013   Insomnia 03/03/2013   Essential hypertension  S/p ST elevation myocardial infarction (STEMI) of inferior wall (September 2010) 02/16/2009    Class: History of   CAD S/P percutaneous coronary angioplasty 06/18/2004   PCP:  Wonda Worth SQUIBB, PA Pharmacy:   [facility] [phone] [address], [place] - [address] AT [address]  [place] [address] Phone: [phone] Fax: [phone]  [id number] [address], [place] - [phone] AT [facility] & [address] [place] [address] Phone: [phone] Fax: [phone]  [facility] [address] [place] [address] Phone: [phone] Fax: [phone]     Social Drivers of Health (SDOH) Social History: SDOH Screenings   Food Insecurity: No Food Insecurity (01/08/2024)  Housing: Low Risk  (01/08/2024)  Transportation Needs: No Transportation Needs (01/08/2024)  Utilities: Not At Risk (01/08/2024)  Social Connections: Socially Integrated (01/08/2024)  Tobacco Use: Low Risk  (01/22/2024)   SDOH Interventions: Transportation Interventions: Intervention Not Indicated, Inpatient TOC, Patient Resources (Friends/Family)   Readmission Risk Interventions    01/26/2024   12:46 PM 10/21/2023   11:36 AM  Readmission Risk Prevention Plan  Transportation Screening Complete Complete  PCP or Specialist Appt within 5-7 Days  Complete  Home Care Screening  Complete  Medication Review (RN CM)  Complete  Medication Review (RN Care Manager) Referral to Pharmacy   PCP or Specialist appointment within 3-5 days of discharge Complete   HRI or Home Care Consult Complete   SW Recovery Care/Counseling Consult Complete   Palliative Care Screening Not Applicable   Skilled Nursing Facility Not Applicable

## 2024-01-27 NOTE — Progress Notes (Signed)
 PROGRESS NOTE    Chase Combs  FMW:[id number] DOB: January 06, 1946 DOA: 01/22/2024 PCP: Wonda Worth SQUIBB, PA   Brief Narrative:  78 year old man w/ hx of ischemic cardiomyopathy and very recent LAD stent placement for NSTEMI discharged 8/27, was doing well for few days, presented back to the ED on 01/23/2024 with progressive nausea vomiting with CT images concerning for Intestinal pneumatosis with ischemic vs infectious colitis.  Patient was in septic shock, admitted under ICU/PCCM, general surgery and cardiology consulted.  Complicated hospitalization for which refer to details below.  Patient eventually was transferred to [facility] on 01/26/2024.  Assessment & Plan:   Principal Problem:   Intestinal ischemia (HCC) Active Problems:   Hyperlipidemia associated with type 2 diabetes mellitus (HCC)   CAD S/P percutaneous coronary angioplasty   S/p ST elevation myocardial infarction (STEMI) of inferior wall (September 2010)   Essential hypertension   Sepsis (HCC)   Pneumatosis intestinalis of large intestine  septic shock due to intra-abdominal source, POA Severe intestinal pneumatosis with ischemic vs infectious colitis/ileus General surgery following, continue to recommend conservative management, Abdominal x-ray 01/25/2024 shows diffuse gaseous distention of colon.  Patient required vasopressors but has been off of vasopressors since 01/24/2024.  NG tube clamp was trialed yesterday, general surgery recommended removing NG tube and starting on clear liquid diet but patient was reluctant and did not let the staff remove NG tube yesterday, he did allow them today.  Management per general surgery.   Possible aspiration pneumonia: Chest x-ray shows bibasilar airspace opacities, patient on Zosyn , doing well, not hypoxic.   Nonoliguric AKI due to Septic ATN: Had normal creatinine and was discharged with creatinine of 0.76 on 01/19/2024.  Presented with creatinine of 2.55, likely due to ATN in the setting of  septic shock.  Creatinine actually improved to 1.46 but worse to 1.99 yesterday with sluggish urine output, provided him with more IV fluids, creatinine has improved to 1.5 today with improved urine output.  Continue to avoid nephrotoxic agents.  Unable to provide with IV fluid due to severe systolic CHF.  Severe hypokalemia: Better than yesterday but still low 3.3.  Will replenish.   CAD w/ LAD drug eluting stent/Chronic combined systolic and diastolic CHF: Echo on 01/13/2024 shows 25 to 30% ejection fraction with grade 1 diastolic dysfunction.  PTA medications include Lasix , Toprol -XL, Aldactone  and Entresto .  All of them on hold due to low blood pressure and AKI.  Patient unable to take p.o. medications, for which he has been transition to cangrelor  in the meanwhile.  Cardiology following and managing.  Appreciate their help.  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  Went into atrial fibrillation around 4 AM on 01/24/2024, started on IV amiodarone .  Currently remains on amiodarone  and cardiology managing.  Cardiology and general surgery avoiding IV heparin  due to high risk of bleeding while he is on cangrelor .  Mild hyponatremia: Stable.  Patient asymptomatic.  Leukopenia: Was suspected to be secondary to sepsis.  Resolved.  DVT prophylaxis: heparin  injection 5,000 Units Start: 01/23/24 1400 Place and maintain sequential compression device Start: 01/22/24 1650 SCDs Start: 01/22/24 1548   Code Status: Full Code  Family Communication:  None present at bedside however he called his wife who was on the speaker phone during the whole encounter.  Plan of care discussed with patient in length and he/she verbalized understanding and agreed with it.  Status is: Inpatient Remains inpatient appropriate because: Still with NG tube and NPO.  Estimated body mass index is 27.27 kg/m as calculated from the following:   Height as of 01/08/24: 6' (1.829 m).    Weight as of this encounter: 91.2 kg.    Nutritional Assessment: Body mass index is 27.27 kg/m.[name] Seen by dietician.  I agree with the assessment and plan as outlined below: Nutrition Status:        . Skin Assessment: I have examined the patient's skin and I agree with the wound assessment as performed by the wound care RN as outlined below:    Consultants:  General Surgery and cardiology  Procedures:  As above  Antimicrobials:  Anti-infectives (From admission, onward)    Start     Dose/Rate Route Frequency Ordered Stop   01/22/24 2200  piperacillin -tazobactam (ZOSYN ) IVPB 3.375 g        3.375 g 12.5 mL/hr over 240 Minutes Intravenous Every 8 hours 01/22/24 1602     01/22/24 1800  metroNIDAZOLE  (FLAGYL ) IVPB 500 mg  Status:  Discontinued        500 mg 100 mL/hr over 60 Minutes Intravenous Every 8 hours 01/22/24 1653 01/23/24 0857   01/22/24 1545  metroNIDAZOLE  (FLAGYL ) IVPB 500 mg  Status:  Discontinued        500 mg 100 mL/hr over 60 Minutes Intravenous  Once 01/22/24 1533 01/22/24 1634   01/22/24 1330  doxycycline (VIBRAMYCIN) 100 mg in sodium chloride  0.9 % 250 mL IVPB  Status:  Discontinued        100 mg 125 mL/hr over 120 Minutes Intravenous  Once 01/22/24 1320 01/22/24 1649   01/22/24 1315  azithromycin (ZITHROMAX) 500 mg in sodium chloride  0.9 % 250 mL IVPB  Status:  Discontinued        500 mg 250 mL/hr over 60 Minutes Intravenous  Once 01/22/24 1309 01/22/24 1319   01/22/24 1315  ceFEPIme  (MAXIPIME ) 2 g in sodium chloride  0.9 % 100 mL IVPB        2 g 200 mL/hr over 30 Minutes Intravenous  Once 01/22/24 1309 01/22/24 1515   01/22/24 1315  vancomycin  (VANCOREADY) IVPB 1750 mg/350 mL  Status:  Discontinued        1,750 mg 175 mL/hr over 120 Minutes Intravenous  Once 01/22/24 1311 01/22/24 1756         Subjective: Patient seen and examined with the nurse at the bedside.  He has no complaints.  No nausea vomiting or abdominal pain.  He has had a bowel movement  as well.  His heart rate was around 110 with atrial fibrillation when I saw him but he was asymptomatic.  Objective: Vitals:   01/27/24 0313 01/27/24 0400 01/27/24 0500 01/27/24 0740  BP:  (!) 151/63 (!) 151/92   Pulse: 99 85 97   Resp: (!) 33 17 17   Temp: 99 F (37.2 C)   97.9 F (36.6 C)  TempSrc: Oral   Axillary  SpO2: 90% 93% 97%   Weight:        Intake/Output Summary (Last 24 hours) at 01/27/2024 0745 Last data filed at 01/27/2024 0500 Gross per 24 hour  Intake 1470.71 ml  Output 1395 ml  Net 75.71 ml   Filed Weights   01/24/24 0423 01/25/24 0500 01/26/24 0500  Weight: 92.1 kg 94.1 kg 91.2 kg    Examination:  General exam: Appears calm and comfortable  Respiratory system: Clear to auscultation. Respiratory effort normal. Cardiovascular system: S1 & S2 heard, irregularly irregular RR. No JVD, murmurs, rubs, gallops or clicks. No pedal  edema. Gastrointestinal system: Abdomen is nondistended, soft and nontender. No organomegaly or masses felt. Normal bowel sounds heard. Central nervous system: Alert and oriented. No focal neurological deficits. Extremities: Symmetric 5 x 5 power. Skin: No rashes, lesions or ulcers.  Psychiatry: Judgement and insight appear normal. Mood & affect appropriate.   Data Reviewed: I have personally reviewed following labs and imaging studies  CBC: Recent Labs  Lab 01/22/24 1243 01/22/24 1259 01/23/24 1111 01/24/24 0336 01/25/24 0752 01/26/24 0439 01/27/24 0539  WBC 2.4*  --  4.4 4.8 4.7 5.5 6.2  NEUTROABS 1.7  --   --   --   --   --   --   HGB 13.9   < > 11.6* 11.2* 10.7* 10.7* 10.2*  HCT 43.2   < > 36.0* 35.4* 33.7* 33.2* 31.1*  MCV 88.5  --  88.9 90.1 89.4 88.5 87.6  PLT 489*  --  355 344 297 299 298   < > = values in this interval not displayed.   Basic Metabolic Panel: Recent Labs  Lab 01/22/24 2159 01/23/24 1111 01/24/24 0755 01/24/24 2221 01/25/24 0752 01/26/24 0439 01/26/24 1832 01/27/24 0539  NA 135 133* 138 133*  132* 134*  --  140  K 2.6* 3.0* 3.3* 3.2* 4.6 2.7* 3.7 3.3*  CL 91* 96* 98 98 98 102  --  103  CO2 29 26 26 23  20* 22  --  21*  GLUCOSE 137* 106* 127* 104* 176* 92  --  164*  BUN 69* 58* 56* 61* 62* 68*  --  47*  CREATININE 2.14* 1.50* 1.46* 1.94* 1.96* 1.99*  --  1.46*  CALCIUM  7.8* 7.6* 8.3* 7.6* 7.5* 7.6*  --  7.5*  MG 2.1 2.0  --   --   --  3.0*  --   --   PHOS  --   --   --   --   --  3.3  --   --    GFR: Estimated Creatinine Clearance: 47.2 mL/min (A) (by C-G formula based on SCr of 1.46 mg/dL (H)). Liver Function Tests: Recent Labs  Lab 01/22/24 1243 01/25/24 0752 01/26/24 0439 01/27/24 0539  AST 29 17 14* 14*  ALT 31 24 21 20   ALKPHOS 64 55 52 59  BILITOT 1.4* 0.8 0.9 0.9  PROT 6.2* 4.3* 4.4* 5.4*  ALBUMIN 3.0* 1.7* 1.7* 1.8*   No results for input(s): LIPASE, AMYLASE in the last 168 hours. No results for input(s): AMMONIA in the last 168 hours. Coagulation Profile: No results for input(s): INR, PROTIME in the last 168 hours. Cardiac Enzymes: No results for input(s): CKTOTAL, CKMB, CKMBINDEX, TROPONINI in the last 168 hours. BNP (last 3 results) Recent Labs    01/07/24 2301  PROBNP 15,245.0*   HbA1C: No results for input(s): HGBA1C in the last 72 hours. CBG: Recent Labs  Lab 01/26/24 1534 01/26/24 1920 01/26/24 2318 01/27/24 0312 01/27/24 0736  GLUCAP 122* 100* 104* 138* 147*   Lipid Profile: No results for input(s): CHOL, HDL, LDLCALC, TRIG, CHOLHDL, LDLDIRECT in the last 72 hours. Thyroid  Function Tests: No results for input(s): TSH, T4TOTAL, FREET4, T3FREE, THYROIDAB in the last 72 hours. Anemia Panel: No results for input(s): VITAMINB12, FOLATE, FERRITIN, TIBC, IRON, RETICCTPCT in the last 72 hours. Sepsis Labs: Recent Labs  Lab 01/22/24 1243 01/22/24 1300 01/22/24 1500 01/24/24 1002 01/24/24 1857  PROCALCITON 1.23  --   --   --   --   LATICACIDVEN  --  3.4* 1.9 1.1 1.1  Recent  Results (from the past 240 hours)  Culture, blood (routine x 2)     Status: None   Collection Time: 01/22/24 12:43 PM   Specimen: BLOOD RIGHT ARM  Result Value Ref Range Status   Specimen Description BLOOD RIGHT ARM  Final   Special Requests   Final    BOTTLES DRAWN AEROBIC AND ANAEROBIC Blood Culture adequate volume   Culture   Final    NO GROWTH 5 DAYS Performed at [facility], [address], [place], [place] [address]    Report Status 01/27/2024 FINAL  Final  Culture, blood (routine x 2)     Status: None   Collection Time: 01/22/24 12:48 PM   Specimen: BLOOD RIGHT HAND  Result Value Ref Range Status   Specimen Description BLOOD RIGHT HAND  Final   Special Requests   Final    BOTTLES DRAWN AEROBIC ONLY Blood Culture results may not be optimal due to an inadequate volume of blood received in culture bottles   Culture   Final    NO GROWTH 5 DAYS Performed at [facility], [address], [place], [place] [address]    Report Status 01/27/2024 FINAL  Final  C Difficile Quick Screen w PCR reflex     Status: None   Collection Time: 01/22/24  1:10 PM   Specimen: STOOL  Result Value Ref Range Status   C Diff antigen NEGATIVE NEGATIVE Final   C Diff toxin NEGATIVE NEGATIVE Final   C Diff interpretation No C. difficile detected.  Final    Comment: Performed at [facility], [address], [place], [place] [address]  MRSA Next Gen by PCR, Nasal     Status: None   Collection Time: 01/22/24  1:10 PM   Specimen: Nasal Mucosa; Nasal Swab  Result Value Ref Range Status   MRSA by PCR Next Gen NOT DETECTED NOT DETECTED Final    Comment: (NOTE) The GeneXpert MRSA Assay (FDA approved for NASAL specimens only), is one component of a comprehensive MRSA colonization surveillance program. It is not intended to diagnose MRSA infection nor to guide or monitor treatment for MRSA infections. Test performance is not FDA approved in patients less than 62 years old. Performed at  [facility], [address] [place], [place] [address]      Radiology Studies: DG Abd 1 View Result Date: 01/25/2024 CLINICAL DATA:  Pneumatosis intestinalis EXAM: ABDOMEN - 1 VIEW COMPARISON:  Abdominal radiograph dated 01/24/2024. FINDINGS: Evaluation is limited due to body habitus. An enteric tube with tip over the epigastric area in the proximal stomach. Diffuse gaseous distension of the colon relatively similar to prior radiograph. No definite free air. No acute osseous pathology. IMPRESSION: 1. Enteric tube with tip over the proximal stomach. 2. Diffuse gaseous distension of the colon. Electronically Signed   By: Vanetta Chou M.D.   On: 01/25/2024 14:37    Scheduled Meds:  Chlorhexidine  Gluconate Cloth  6 each Topical Daily   heparin  injection (subcutaneous)  5,000 Units Subcutaneous Q8H   insulin  aspart  0-9 Units Subcutaneous Q4H   sodium chloride  flush  10-40 mL Intracatheter Q12H   Continuous Infusions:  amiodarone  30 mg/hr (01/27/24 0657)   cangrelor  (KENGREAL ) 50 mg in sodium chloride  0.9 % 250 mL (0.2 mg/mL) infusion 0.75 mcg/kg/min (01/27/24 0657)   piperacillin -tazobactam 3.375 g (01/27/24 0532)   potassium chloride        LOS: 5 days   Fredia Skeeter, MD [facility]  01/27/2024, 7:45 AM   *Please note that this is a verbal dictation therefore any spelling or grammatical errors are due to the Dragon Medical One system interpretation.  Please page via Amion and do not message via secure chat for urgent patient care matters. Secure chat can be used for non urgent patient care matters.  How to contact the [facility] Attending or Consulting provider 7A - 7P or covering provider during after hours 7P -7A, for this patient?  Check the care team in [facility] and look for a) attending/consulting [facility] provider listed and b) the [facility] team listed. Page or secure chat 7A-7P. Log into [email or web address] and use [facility]'s universal password to access. If you do not have the  password, please contact the hospital operator. Locate the [facility] provider you are looking for under [facility] and page to a number that you can be directly reached. If you still have difficulty reaching the provider, please page the [facility] (Director on Call) for the Hospitalists listed on amion for assistance.

## 2024-01-28 ENCOUNTER — Inpatient Hospital Stay (HOSPITAL_COMMUNITY)

## 2024-01-28 DIAGNOSIS — K5981 Ogilvie syndrome: Secondary | ICD-10-CM | POA: Diagnosis present

## 2024-01-28 DIAGNOSIS — K6389 Other specified diseases of intestine: Secondary | ICD-10-CM | POA: Diagnosis not present

## 2024-01-28 DIAGNOSIS — K559 Vascular disorder of intestine, unspecified: Secondary | ICD-10-CM | POA: Diagnosis not present

## 2024-01-28 DIAGNOSIS — I4891 Unspecified atrial fibrillation: Secondary | ICD-10-CM | POA: Diagnosis not present

## 2024-01-28 DIAGNOSIS — E440 Moderate protein-calorie malnutrition: Secondary | ICD-10-CM | POA: Diagnosis present

## 2024-01-28 DIAGNOSIS — Z9861 Coronary angioplasty status: Secondary | ICD-10-CM | POA: Diagnosis not present

## 2024-01-28 DIAGNOSIS — I428 Other cardiomyopathies: Secondary | ICD-10-CM

## 2024-01-28 DIAGNOSIS — I2510 Atherosclerotic heart disease of native coronary artery without angina pectoris: Secondary | ICD-10-CM | POA: Diagnosis not present

## 2024-01-28 LAB — COMPREHENSIVE METABOLIC PANEL WITH GFR
ALT: 18 U/L (ref 0–44)
AST: 14 U/L — ABNORMAL LOW (ref 15–41)
Albumin: 1.8 g/dL — ABNORMAL LOW (ref 3.5–5.0)
Alkaline Phosphatase: 58 U/L (ref 38–126)
Anion gap: 9 (ref 5–15)
BUN: 31 mg/dL — ABNORMAL HIGH (ref 8–23)
CO2: 24 mmol/L (ref 22–32)
Calcium: 7.6 mg/dL — ABNORMAL LOW (ref 8.9–10.3)
Chloride: 106 mmol/L (ref 98–111)
Creatinine, Ser: 1.07 mg/dL (ref 0.61–1.24)
GFR, Estimated: >60 mL/min (ref >60)
Glucose, Bld: 128 mg/dL — ABNORMAL HIGH (ref 70–99)
Potassium: 3.9 mmol/L (ref 3.5–5.1)
Sodium: 139 mmol/L (ref 135–145)
Total Bilirubin: 0.6 mg/dL (ref 0.0–1.2)
Total Protein: 5 g/dL — ABNORMAL LOW (ref 6.5–8.1)

## 2024-01-28 LAB — MAGNESIUM: Magnesium: 2.4 mg/dL (ref 1.7–2.4)

## 2024-01-28 LAB — CBC
HCT: 31.8 % — ABNORMAL LOW (ref 39.0–52.0)
Hemoglobin: 10.4 g/dL — ABNORMAL LOW (ref 13.0–17.0)
MCH: 28.8 pg (ref 26.0–34.0)
MCHC: 32.7 g/dL (ref 30.0–36.0)
MCV: 88.1 fL (ref 80.0–100.0)
Platelets: 300 K/uL (ref 150–400)
RBC: 3.61 MIL/uL — ABNORMAL LOW (ref 4.22–5.81)
RDW: 17.3 % — ABNORMAL HIGH (ref 11.5–15.5)
WBC: 6.8 K/uL (ref 4.0–10.5)
nRBC: 0 % (ref 0.0–0.2)

## 2024-01-28 LAB — GLUCOSE, CAPILLARY
Glucose-Capillary: 122 mg/dL — ABNORMAL HIGH (ref 70–99)
Glucose-Capillary: 157 mg/dL — ABNORMAL HIGH (ref 70–99)
Glucose-Capillary: 158 mg/dL — ABNORMAL HIGH (ref 70–99)
Glucose-Capillary: 165 mg/dL — ABNORMAL HIGH (ref 70–99)
Glucose-Capillary: 202 mg/dL — ABNORMAL HIGH (ref 70–99)
Glucose-Capillary: 218 mg/dL — ABNORMAL HIGH (ref 70–99)

## 2024-01-28 LAB — PHOSPHORUS: Phosphorus: 1.9 mg/dL — ABNORMAL LOW (ref 2.5–4.6)

## 2024-01-28 LAB — TRIGLYCERIDES: Triglycerides: 98 mg/dL (ref <150)

## 2024-01-28 MED ORDER — THIAMINE HCL 100 MG/ML IJ SOLN
100.0000 mg | Freq: Every day | INTRAMUSCULAR | Status: AC
  Administered 2024-01-28 – 2024-02-01 (×5): 100 mg via INTRAVENOUS
  Filled 2024-01-28 (×5): qty 2

## 2024-01-28 MED ORDER — POTASSIUM & SODIUM PHOSPHATES 280-160-250 MG PO PACK
1.0000 | PACK | Freq: Three times a day (TID) | ORAL | Status: DC
  Filled 2024-01-28: qty 1

## 2024-01-28 MED ORDER — SODIUM CHLORIDE 0.9 % IV SOLN
30.0000 mmol | Freq: Once | INTRAVENOUS | Status: AC
  Administered 2024-01-28: 30 mmol via INTRAVENOUS
  Filled 2024-01-28: qty 10

## 2024-01-28 MED ORDER — TRAVASOL 10 % IV SOLN
INTRAVENOUS | Status: AC
  Filled 2024-01-28: qty 374.4

## 2024-01-28 MED ORDER — POTASSIUM CHLORIDE 10 MEQ/50ML IV SOLN
10.0000 meq | INTRAVENOUS | Status: AC
  Administered 2024-01-28 (×2): 10 meq via INTRAVENOUS
  Filled 2024-01-28 (×2): qty 50

## 2024-01-28 NOTE — Plan of Care (Signed)

## 2024-01-28 NOTE — Progress Notes (Signed)
 Patient Name: Chase Combs Date of Encounter: 01/28/2024 [facility] Cardiologist: Alm Clay, MD  01/22/2024 .admit Length of stay: 6  Interval Summary  .    Chase Combs is a 78 y.o. Caucasian male patient with coronary artery disease and history of angioplasty to proximal/ostial LAD on 01/17/2024, has prior stents to RCA and ramus intermediate which had mild disease, admitted to the hospital on 01/22/2024 with ischemic colitis.  Patient n.p.o. status, started on antiplatelet therapy with cangrelor .  Past medical history significant for ischemic cardiomyopathy, recent decrease in EF from 40% to 25% by recent echocardiogram on 01/13/2024 compared to 07/12/2023, new onset A-fib with RVR, hypertension, hypercholesterolemia, diabetes mellitus, obesity.  Physical Exam    Vitals:   01/28/24 0404 01/28/24 0500 01/28/24 0840 01/28/24 0907  BP: 117/69  (!) 132/94   Pulse:   88   Resp: 18  20   Temp: 97.7 F (36.5 C)  (!) 97.5 F (36.4 C)   TempSrc: Oral  Oral   SpO2:   91%   Weight:  91.1 kg  91.1 kg  Height:    6' (1.829 m)   Physical Exam Constitutional:      Appearance: He is obese.  Neck:     Vascular: No carotid bruit or JVD.  Cardiovascular:     Rate and Rhythm: Normal rate and regular rhythm.     Pulses:          Dorsalis pedis pulses are 0 on the right side and 0 on the left side.       Posterior tibial pulses are 0 on the right side and 0 on the left side.     Heart sounds: Heart sounds are distant. No murmur heard.    No gallop.  Pulmonary:     Effort: Pulmonary effort is normal.     Breath sounds: Normal breath sounds.  Abdominal:     General: Bowel sounds are absent. There is distension.  Musculoskeletal:     Right lower leg: No edema.     Left lower leg: No edema.        01/28/2024    9:07 AM 01/28/2024    5:00 AM 01/26/2024    5:00 AM  Last 3 Weights  Weight (lbs) 200 lb 14.5 oz 200 lb 14.5 oz 201 lb 1 oz  Weight (kg) 91.13 kg 91.13 kg 91.2 kg      Labs   Lab Results  Component Value Date   NA 139 01/28/2024   K 3.9 01/28/2024   CO2 24 01/28/2024   GLUCOSE 128 (H) 01/28/2024   BUN 31 (H) 01/28/2024   CREATININE 1.07 01/28/2024   CALCIUM  7.6 (L) 01/28/2024   GFR 95.00 11/29/2023   EGFR 95 06/06/2021   GFRNONAA >60 01/28/2024       Latest Ref Rng & Units 01/28/2024    4:00 AM 01/27/2024    5:39 AM 01/26/2024    6:32 PM  BMP  Glucose 70 - 99 mg/dL 871  835    BUN 8 - 23 mg/dL 31  47    Creatinine 9.38 - 1.24 mg/dL 8.92  8.53    Sodium 864 - 145 mmol/L 139  140    Potassium 3.5 - 5.1 mmol/L 3.9  3.3  3.7   Chloride 98 - 111 mmol/L 106  103    CO2 22 - 32 mmol/L 24  21    Calcium  8.9 - 10.3 mg/dL 7.6  7.5  Latest Ref Rng & Units 01/28/2024    4:00 AM 01/27/2024    5:39 AM 01/26/2024    4:39 AM  CBC  WBC 4.0 - 10.5 K/uL 6.8  6.2  5.5   Hemoglobin 13.0 - 17.0 g/dL 89.5  89.7  89.2   Hematocrit 39.0 - 52.0 % 31.8  31.1  33.2   Platelets 150 - 400 K/uL 300  298  299     Lab Results  Component Value Date   CHOL 109 01/18/2024   HDL 37 (L) 01/18/2024   LDLCALC 49 01/18/2024   TRIG 98 01/28/2024   CHOLHDL 2.9 01/18/2024    Lab Results  Component Value Date   TSH 2.402 01/18/2024    Lab Results  Component Value Date   HGBA1C 6.4 (H) 09/22/2023    Cardiac Panel (last 3 results) No results for input(s): CKTOTAL, CKMB, TROPONINIHS, RELINDX in the last 72 hours.  BNP (last 3 results) Recent Labs    01/11/24 0539 01/13/24 0537 01/22/24 1243  BNP 842.8* 1,747.3* 220.4*    ProBNP (last 3 results) Recent Labs    01/07/24 2301  PROBNP 15,245.0*     Intake/Output Summary (Last 24 hours) at 01/28/2024 1131 Last data filed at 01/28/2024 0500 Gross per 24 hour  Intake 958.11 ml  Output 1000 ml  Net -41.89 ml    Net IO Since Admission: 8,167.56 mL [01/28/24 1131]  Tele/EKG/Cardiac studies    Telemetry 01/28/24 : A-fib with RVR  EKG:  EKG 01/24/2024: A-fib with RVR at rate of 132 bpm, left  anterior fascicular block.  Right bundle branch block.  Nonspecific T abnormality.  Compared to 01/17/2024, atrial fibrillation is new, IRBBB is no complete RBBB.  ECHOCARDIOGRAM COMPLETE 01/13/2024:  1. Left ventricular ejection fraction, by estimation, is 25 to 30%. The left ventricle has severely decreased function. The left ventricle demonstrates global hypokinesis. The left ventricular internal cavity size was moderately dilated. Left  ventricular diastolic parameters are consistent with Grade I diastolic dysfunction (impaired relaxation).  2. Right ventricular systolic function is moderately reduced. The right ventricular size is normal. There is severely elevated pulmonary artery systolic pressure.  3. Left atrial size was moderately dilated.  4. Right atrial size was mildly dilated.  5. The mitral valve is normal in structure. Mild mitral valve regurgitation. No evidence of mitral stenosis.  6. The aortic valve is normal in structure. Aortic valve regurgitation is mild. No aortic stenosis is present.  7. The inferior vena cava is dilated in size with <50% respiratory variability, suggesting right atrial pressure of 15 mmHg.  CARDIAC CATHETERIZATION 01/17/2024 PCI to Prox LAD with Synergy XD 3.0 x 20 mm drug-eluting stent  Prior stents patent in RCA and RI    Radiology   Imaging results have been reviewed Current Meds:     Current Facility-Administered Medications:    [COMPLETED] amiodarone  (NEXTERONE ) 1.8 mg/mL load via infusion 150 mg, 150 mg, Intravenous, Once, 150 mg at 01/24/24 0810 **FOLLOWED BY** [EXPIRED] amiodarone  (NEXTERONE  PREMIX) 360-4.14 MG/200ML-% (1.8 mg/mL) IV infusion, 60 mg/hr, Intravenous, Continuous, Stopped at 01/25/24 0700 **FOLLOWED BY** amiodarone  (NEXTERONE  PREMIX) 360-4.14 MG/200ML-% (1.8 mg/mL) IV infusion, 30 mg/hr, Intravenous, Continuous, Cindie Ole DASEN, MD, Last Rate: 16.67 mL/hr at 01/28/24 0547, 30 mg/hr at 01/28/24 0547   cangrelor  (KENGREAL ) 50  mg in sodium chloride  0.9 % 250 mL (0.2 mg/mL) infusion, 0.75 mcg/kg/min, Intravenous, Continuous, Hershal Vito MATSU, RPH, Last Rate: 19.76 mL/hr at 01/28/24 0856, 0.75 mcg/kg/min at 01/28/24 0856   Chlorhexidine   Gluconate Cloth 2 % PADS 6 each, 6 each, Topical, Daily, Claudene Toribio BROCKS, MD, 6 each at 01/28/24 1043   docusate sodium  (COLACE) capsule 100 mg, 100 mg, Oral, BID PRN, Claudene Toribio BROCKS, MD   feeding supplement (BOOST / RESOURCE BREEZE) liquid 1 Container, 1 Container, Oral, TID BM, Pahwani, Ravi, MD, 1 Container at 01/27/24 2005   heparin  injection 5,000 Units, 5,000 Units, Subcutaneous, Q8H, Chand, Sudham, MD, 5,000 Units at 01/28/24 0541   HYDROmorphone  (DILAUDID ) injection 0.5 mg, 0.5 mg, Intravenous, Q4H PRN, Pham, Minh Q, RPH-CPP, 0.5 mg at 01/28/24 0010   insulin  aspart (novoLOG ) injection 0-9 Units, 0-9 Units, Subcutaneous, Q4H, Hunsucker, Donnice SAUNDERS, MD, 1 Units at 01/28/24 0855   ondansetron  (ZOFRAN ) injection 4 mg, 4 mg, Intravenous, Q6H PRN, Harold Scholz, MD, 4 mg at 01/28/24 0010   piperacillin -tazobactam (ZOSYN ) IVPB 3.375 g, 3.375 g, Intravenous, Q8H, Claudene Toribio BROCKS, MD, Last Rate: 12.5 mL/hr at 01/28/24 0542, 3.375 g at 01/28/24 0542   polyethylene glycol (MIRALAX  / GLYCOLAX ) packet 17 g, 17 g, Oral, Daily PRN, Claudene Toribio BROCKS, MD   potassium & sodium phosphates  (PHOS-NAK) 280-160-250 MG packet 1 packet, 1 packet, Oral, TID WC & HS, Sreeram, Narendranath, MD   sodium chloride  flush (NS) 0.9 % injection 10-40 mL, 10-40 mL, Intracatheter, Q12H, Claudene Toribio BROCKS, MD, 10 mL at 01/28/24 1045   sodium chloride  flush (NS) 0.9 % injection 10-40 mL, 10-40 mL, Intracatheter, PRN, Claudene Toribio BROCKS, MD, 10 mL at 01/25/24 2121  Assessment & Plan .     1.  Coronary artery disease native vessel without angina pectoris, patient with recent ostial/proximal LAD DES placed on 01/17/2024. 2.  A-fib with RVR, new onset. 3.  New onset heart failure with reduced ejection fraction, suspect nonischemic  cardiomyopathy in view of A-fib with RVR.  Recent echocardiogram prior to this hospitalization had revealed EF around 45%.  No clinical evidence of heart failure presently, in fact soft blood pressure related to hypovolemia and third spacing due to GI issues. 4.  Ischemic colitis, presently n.p.o. status.  Recommendations: I would recommend that we discontinue cangrelor  and switch him to Aggrastat in view of recent stent, half-life is relatively short in case he needs emergent surgery which can be performed within 2 to 4 hours after discontinuing Aggrastat.  I have placed a call for surgery to see whether surgical intervention is planned as he is not making significant progress.  With A-fib with RVR, his cardioembolic risk would be extremely high as well.  He probably should be treated with IV heparin  as his hemoglobin is stable.  Will wait upon surgical team to make patient) made, make anticoagulation/antiplatelet therapy recommendations.  Will continue to follow along.  For questions or updates, please contact [facility] Please consult [email or web address] for contact info under        Signed,   Gordy Bergamo, MD, [facility] 01/28/2024, 11:31 AM [facility] [address] [place], [place] [address] Phone: [phone]. Fax:  [phone]

## 2024-01-28 NOTE — Progress Notes (Signed)
 Progress Note   Patient: Chase Combs FMW:[id number] DOB: 07-14-1945 DOA: 01/22/2024     6 DOS: the patient was seen and examined on 01/28/2024   Brief hospital course: 78 year old man w/ hx of ischemic cardiomyopathy and very recent LAD stent placement for NSTEMI discharged 8/27, was doing well for few days, presented back to the ED on 01/23/2024 with progressive nausea vomiting with CT images concerning for Intestinal pneumatosis with ischemic vs infectious colitis.  Patient was in septic shock, admitted under ICU/PCCM, general surgery and cardiology consulted.  Complicated hospitalization for which refer to details below.   Patient eventually was transferred to [facility] on 01/26/2024.   Assessment and Plan: Septic shock due to intra-abdominal source, POA Severe intestinal pneumatosis with ischemic vs infectious colitis/ileus General surgery following, continue to recommend conservative management, Abdominal x-ray 01/25/2024 shows diffuse gaseous distention of colon.  Patient required vasopressors but has been off of vasopressors since 01/24/2024.  NG tube clamp and removed 01/27/24. Today KUB concern for ileus. GI consulted, TPN started per surgery recommendations. He is on clear liquids and is more than 7 days with poor nutrition. TPN per pharmacy consult ordered.   Possible aspiration pneumonia: Chest x-ray shows bibasilar airspace opacities. Patient got total 7 days of Zosyn , doing well, not hypoxic. Stopped Zosyn  today.   Nonoliguric AKI due to Septic ATN: Presented with creatinine of 2.55, likely due to ATN in the setting of septic shock.  Creatinine improved with gentle IV fluids. Monitor urine output.   Avoid nephrotoxic agents. Caution with fluid overload due to systolic CHF.   Severe hypokalemia: improved with supplements.   CAD w/ LAD drug eluting stent/Chronic combined systolic and diastolic CHF:  Echo on 01/13/2024 shows 25 to 30% ejection fraction with grade 1 diastolic dysfunction.   PTA medications include Lasix , Toprol -XL, Aldactone  and Entresto , all on hold due to low blood pressure and AKI.   Patient unable to take p.o. medications, for which he has been transitioned to cangrelor  pending surgery recommendations. Cardiology follow up appreciated.   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  Started IV amiodarone .  Currently remains on amiodarone  and cardiology managing.  Cardiology and general surgery avoiding IV heparin  due to high risk of bleeding while he is on cangrelor . Further anticoagulation recommendations per cardiology pending surgery plan.   Mild hyponatremia: improved.   Leukopenia: Was suspected to be secondary to sepsis.  Resolved.      Out of bed to chair. Incentive spirometry. Nursing supportive care. Fall, aspiration precautions. Diet:  Diet Orders (From admission, onward)     Start     Ordered   01/26/24 1723  Diet clear liquid Room service appropriate? Yes; Fluid consistency: Thin  Diet effective now       Question Answer Comment  Room service appropriate? Yes   Fluid consistency: Thin      01/26/24 1722           DVT prophylaxis: heparin  injection 5,000 Units Start: 01/23/24 1400 Place and maintain sequential compression device Start: 01/22/24 1650 SCDs Start: 01/22/24 1548  Level of care: Progressive   Code Status: Full Code  Subjective: Patient is seen and examined today morning. He is lying comfortably, states he got KUB today. RN noted rectal tube leak. Tolerating clears.  Physical Exam: Vitals:   01/28/24 0500 01/28/24 0840 01/28/24 0907 01/28/24 1141  BP:  (!) 132/94    Pulse:  88    Resp:  20  (!) (  P) 23  Temp:  (!) 97.5 F (36.4 C)  (P) 98.6 F (37 C)  TempSrc:  Oral  (P) Oral  SpO2:  91%    Weight: 91.1 kg  91.1 kg   Height:   6' (1.829 m)     General - Elderly Caucasian male, no apparent distress HEENT - PERRLA, EOMI, atraumatic head, non tender  sinuses. Lung - Clear, basal rales, rhonchi, no wheezes. Heart - S1, S2 heard, no murmurs, rubs, trace pedal edema. Abdomen - Soft, non tender, distended, bowel sounds poor, rectal tube noted. Neuro - Alert, awake and oriented x 3, non focal exam. Skin - Warm and dry.  Data Reviewed:      Latest Ref Rng & Units 01/28/2024    4:00 AM 01/27/2024    5:39 AM 01/26/2024    4:39 AM  CBC  WBC 4.0 - 10.5 K/uL 6.8  6.2  5.5   Hemoglobin 13.0 - 17.0 g/dL 89.5  89.7  89.2   Hematocrit 39.0 - 52.0 % 31.8  31.1  33.2   Platelets 150 - 400 K/uL 300  298  299       Latest Ref Rng & Units 01/28/2024    4:00 AM 01/27/2024    5:39 AM 01/26/2024    6:32 PM  BMP  Glucose 70 - 99 mg/dL 871  835    BUN 8 - 23 mg/dL 31  47    Creatinine 9.38 - 1.24 mg/dL 8.92  8.53    Sodium 864 - 145 mmol/L 139  140    Potassium 3.5 - 5.1 mmol/L 3.9  3.3  3.7   Chloride 98 - 111 mmol/L 106  103    CO2 22 - 32 mmol/L 24  21    Calcium  8.9 - 10.3 mg/dL 7.6  7.5     DG Abd 1 View Result Date: 01/28/2024 CLINICAL DATA:  [id number] Ileus [facility]) [id number] EXAM: ABDOMEN - 1 VIEW COMPARISON:  January 25, 2024, January 22, 2024 FINDINGS: The stomach is not evaluated.Similar diffuse gaseous distension of the entire colon with minimal rectal gas present. Generalized paucity of small bowel gas throughout the central abdomen.No pneumoperitoneum. Multilevel degenerative disc disease of the spine. IMPRESSION: Similar diffuse gaseous distension of the entire colon. Electronically Signed   By: Rogelia Myers M.D.   On: 01/28/2024 10:01    Family Communication: Discussed with patient, understand and agree. All questions answered.  Disposition: Status is: Inpatient Remains inpatient appropriate because: on TPN for ileus  Planned Discharge Destination: Rehab     Time spent: 51 minutes  Author: Concepcion Riser, MD 01/28/2024 1:04 PM Secure chat 7am to 7pm For on call review [email or web address].

## 2024-01-28 NOTE — Progress Notes (Addendum)
 Intestinal ischemia (HCC)  Subjective: Sleeping initially. States he tolerated his CLD trays yesterday with no nausea or emesis but no PO charted. 300cc of stool out yesterday, nonbloody. Denies pain  Objective: Vital signs in last 24 hours: Temp:  [97.4 F (36.3 C)-97.9 F (36.6 C)] 97.7 F (36.5 C) (09/05 0404) Pulse Rate:  [71-113] 102 (09/04 2200) Resp:  [18-24] 18 (09/05 0404) BP: (117-159)/(68-117) 117/69 (09/05 0404) SpO2:  [93 %-100 %] 95 % (09/04 2200) Weight:  [91.1 kg] 91.1 kg (09/05 0500) Last BM Date : 01/27/24  Intake/Output from previous day: 09/04 0701 - 09/05 0700 In: 1252.9 [I.V.:811.4; IV Piggyback:441.5] Out: 1000 [Urine:700; Stool:300] Intake/Output this shift: No intake/output data recorded.  Gen: alert, cooperative NAD CV: RRR Pulm: normal effort  Abd: soft, moderate to significant distension, patient states this is his baseline. Fat containing umbilical hernia  Lab Results:  Reviewed personally   Studies/Results No new imaging  MEDS, Scheduled  Chlorhexidine  Gluconate Cloth  6 each Topical Daily   feeding supplement  1 Container Oral TID BM   heparin  injection (subcutaneous)  5,000 Units Subcutaneous Q8H   insulin  aspart  0-9 Units Subcutaneous Q4H   sodium chloride  flush  10-40 mL Intracatheter Q12H     Assessment: Intestinal ischemia (HCC) Colonic ileus, possible ischemic colitis in the setting of recent cardiac stent to LAD on Brilinta  (held)   Plan: -  Adynamic ileus is clinically improving in some ways, decreased pain, and slightly improved distension but still quite distended clinically and on KUB -  No PO charted yesterday but patient states he tolerated all his CLD trays without nausea.  - KUB this AM with continued gaseous distension of colon.  - Recommend involving GI for evaluation of colonic ileus, possible ischemic colitis - Patient with poor nutrition, would consider starting TPN.  -  Has a rectal pouch in place  -  On IV  cangrelor  right now for stent  -  SQH for DVT ppx, hemoglobin stable at 10.4 from 10.2.  -  On IV Zosyn  for possible aspiration pneumonia - Recommend K > 4, Mg > 2 Phos > 3  Per primary team --  AKI  CAD s/p DES to LAD - IV cangrelor   Afib/RVR - amio gtt  Chronic systolic CHF Leukopenia - WBC 4    LOS: 6 days    Orie Silversmith, MD [facility]   Patient's medical decision making was moderate    01/28/2024 8:37 AM

## 2024-01-28 NOTE — Progress Notes (Addendum)
 PHARMACY - TOTAL PARENTERAL NUTRITION CONSULT NOTE  Indication: Prolonged ileus  Patient Measurements: Height: 6' (182.9 cm) Weight: 91.1 kg (200 lb 14.5 oz) IBW/kg (Calculated) : 77.6   Body mass index is 27.25 kg/m.  Assessment:  35 YOM presented on 8/30 with abdominal pain, found to have adynamic ileus and possible ischemic colitis.  Patient stated his last meal was on 8/30 and was likely losing weight PTA, but unable to quantify.  He was started on a clear liquid diet on 9/3 PM and hasn't been able to advance.  Pharmacy consulted to manage TPN.  He is at risk for refeeding syndrome.  Glucose / Insulin : no hx DM - CBGs variable (120-200s) Used 10 units SSI in the past 24 hrs Electrolytes: K 3.9 post KCL PO and KCL x 6 runs (goal >/= 4, has required a significant amount of KCL runs since admission, up to 20 runs on day one), Phos down to 1.9, others WNL Renal: SCr 1.07, BUN 31 Hepatic: LFTs / tbili / TG WNL, albumin 1.8 Intake / Output; MIVF: UOP 0.3 ml/kg/hr, stool GI Imaging: 9/5 KUB: diffuse gaseous distension of entire colon GI Surgeries / Procedures: none since TPN initiation   Central access: PICC placed 01/24/24 TPN start date: 01/28/24  Nutritional Goals: Goal TPN rate is 80 mL/hr (provides 100g AA and 1965 kCal per day)  RD Estimated Needs Total Energy Estimated Needs: 1900-2100 kcals Total Protein Estimated Needs: 95-110g Total Fluid Estimated Needs: 1.9-2.1L/day  Current Nutrition:  TPN CLD started 9/3 Resource Breeze TID - 1 charted on 9/4  Plan:  Start TPN at 30 mL/hr at 1800 (goal rate 80 ml/hr) Electrolytes in TPN: Na 86mEq/L, K 82mEq/L, Ca 83mEq/L, Mg 21mEq/L, Phos 15mmol/L, Cl:Ac 1:1 Add standard MVI and trace elements to TPN Increase SSI to moderate Q4H  Phos NAK TID x 8 doses >> change to KPhos 30mmol IV per discussion with MD KCL x 2 runs Thiamine  100mg  IV daily x 5 (last dose 9/9) Monitor TPN labs on Mon/Thurs - TPN labs x 3 days  Kenneth Cuaresma  D. Lendell, PharmD, BCPS, BCCCP 01/28/2024, 11:32 AM

## 2024-01-28 NOTE — Plan of Care (Signed)
  Problem: Education: Goal: Knowledge of General Education information will improve Description: Including pain rating scale, medication(s)/side effects and non-pharmacologic comfort measures Outcome: Progressing   Problem: Health Behavior/Discharge Planning: Goal: Ability to manage health-related needs will improve Outcome: Progressing   Problem: Clinical Measurements: Goal: Ability to maintain clinical measurements within normal limits will improve Outcome: Progressing Goal: Will remain free from infection Outcome: Progressing Goal: Diagnostic test results will improve Outcome: Progressing   Problem: Activity: Goal: Risk for activity intolerance will decrease Outcome: Progressing   Problem: Nutrition: Goal: Adequate nutrition will be maintained Outcome: Progressing   Problem: Elimination: Goal: Will not experience complications related to bowel motility Outcome: Progressing   Problem: Pain Managment: Goal: General experience of comfort will improve and/or be controlled Outcome: Progressing   Problem: Safety: Goal: Ability to remain free from injury will improve Outcome: Progressing

## 2024-01-28 NOTE — Consult Note (Addendum)
 Consultation Note   Referring Provider:  [facility] PCP: Wonda Worth SQUIBB, PA Primary Gastroenterologist:   Glendia Holt, MD     Reason for Consultation:  Ileus DOA: 01/22/2024         Hospital Day: 7   ASSESSMENT    78 year old male admitted with severe sepsis , intestinal pneumatosis with possible ischemic versus infectious colitis  and ileus .  No surgical intervention needed per surgery . Sepsis resolved but has persistent loose stool and persistent colonic ileus with poor p.o. intake requiring TNA.  C. difficile negative.  Similar presentation following knee surgery in May when he had a prolonged refractory postop ileus with loose stool.  Eventually responded to trial of metronidazole   Malnutrition in setting of recent hospitalizations with prolonged ileus  CAD / NSTEMI s/p stenting x 2 mid Aug 2025  Sleep apnea  Hypertension  Principal Problem:   Intestinal ischemia (HCC) Active Problems:   CAD S/P percutaneous coronary angioplasty   S/p ST elevation myocardial infarction (STEMI) of inferior wall (September 2010)   Essential hypertension   Hyperlipidemia associated with type 2 diabetes mellitus (HCC)   Sepsis (HCC)   Pneumatosis intestinalis of large intestine   Malnutrition of moderate degree      PLAN:   -- Please keep electrolytes within normal limits -- Discussed frequent position changes with patient.  Also if he is able to ambulate this would be helpful -- Discussed minimization of narcotics when able -- Empiric metronidazole  BID (may not tolerate due to nausea but will try) --If no improvement will consider trial of Mestinon (high risk for neostigmine )   HPI   GI history Mr. Kutzer was followed by us  for several days for postop ileus in May 2025 . At that time ileus involve mainly the small bowel and right colon .  Symptoms consisted of nausea, vomiting, abdominal distention and diarrhea .  Symptoms eventually improved with NG tube decompression , neostigmine , Relistor  , IV Reglan  , and smog enemas .  However a few days later he had worsening of small bowel dilation on imaging, increased NG tube output , increased amounts of liquid stool output and he developed fever.  C. difficile was negative.  GI path panel was negative . CT scan was repeated on 10/17/2023 to look evaluate for possible SBO.  There was diffuse small bowel dilation without transition point.  There was new diffuse colon wall thickening suggestive of colitis .  There was also concern for right lung aspiration . He was started on Zosyn .  He underwent a flexible sigmoidoscopy and there was congested mucosa in the entire examined colon.  Colon biopsies were benign.  Relistor  was held for the diarrhea.  Eventually Reglan  and Zosyn  were also held due to the diarrhea.  He eventually had to be started on TNA due to inability to maintain nutrition.  He was started on Questran  and stools did start to solidify.  The stool was noted to be malodorous.  We decided to treat empirically for infectious etiology or SIBO with metronidazole .  Symptoms significantly improved and eventually resolved.  He was seen in our office for posthospital follow-up and was doing well at that time.  Advised to avoid opiates.  He was being  worked up for anemia.  Colon cancer screening was discussed, he wanted to proceed with Cologuard.  He refused colonoscopy.  Please see 7/7 office visit for further details.   Interval history; Patient was admitted mid August 2025 with an NSTEMI.  He underwent PCI with DES on 01/17/2024.  Started on dual antiplatelet therapy.  He was discharged 01/19/2024 and felt okay for day or 2 before developing nausea, vomiting and diarrhea.  He was readmitted on 8/30 with severe sepsis /septic shock.  CT scan showed intestinal pneumatosis concerning for ischemic colitis.  He had AKI.  General surgery was consulted, surgical intervention not  needed but they followed along.  Abdominal pain and sepsis improved with antibiotics.  Patient has continued to have abdominal distention and nausea.  He continues to have loose stool.  Abdominal films today show persistent gaseous distention of the entire colon.   Potassium 3.9, sodium 139 Phosphorus low at 1.9.  Albumin 1.8 WBC 6.8 Hemoglobin 10.4  Pertinent GI Studies   10/20/23 sigmoidoscopy for diarrhea and abnormal CT scan of the GI tract - Preparation of the colon was fair.  - Diverticulosis in the left colon.  - Congested mucosa in the entire examined colon. Biopsied.  - Internal hemorrhoids.  - The examination was otherwise normal.  Biopsies -benign colonic mucosa   Labs and Imaging:  Recent Labs    01/26/24 0439 01/27/24 0539 01/28/24 0400  PROT 4.4* 5.4* 5.0*  ALBUMIN 1.7* 1.8* 1.8*  AST 14* 14* 14*  ALT 21 20 18   ALKPHOS 52 59 58  BILITOT 0.9 0.9 0.6   Recent Labs    01/26/24 0439 01/27/24 0539 01/28/24 0400  WBC 5.5 6.2 6.8  HGB 10.7* 10.2* 10.4*  HCT 33.2* 31.1* 31.8*  MCV 88.5 87.6 88.1  PLT 299 298 300   Recent Labs    01/26/24 0439 01/26/24 1832 01/27/24 0539 01/28/24 0400  NA 134*  --  140 139  K 2.7* 3.7 3.3* 3.9  CL 102  --  103 106  CO2 22  --  21* 24  GLUCOSE 92  --  164* 128*  BUN 68*  --  47* 31*  CREATININE 1.99*  --  1.46* 1.07  CALCIUM  7.6*  --  7.5* 7.6*     DG Abd 1 View CLINICAL DATA:  [id number] Ileus (HCC) [id number]  EXAM: ABDOMEN - 1 VIEW  COMPARISON:  January 25, 2024, January 22, 2024  FINDINGS: The stomach is not evaluated.Similar diffuse gaseous distension of the entire colon with minimal rectal gas present. Generalized paucity of small bowel gas throughout the central abdomen.No pneumoperitoneum. Multilevel degenerative disc disease of the spine.  IMPRESSION: Similar diffuse gaseous distension of the entire colon.  Electronically Signed   By: Rogelia Myers M.D.   On: 01/28/2024 10:01   Past Medical  History:  Diagnosis Date   Abdominal hernia    per pt   Arthritis    hands   Benign localized prostatic hyperplasia with lower urinary tract symptoms (LUTS)    CAD (coronary artery disease) 05/2004   cardiologist--- dr anner;  positive stress test , had outpt cardiac cath 05-27-2004 stenosis RI;  06-18-2004 cath w/ PCI and BMS x1 to pRI;   STEMI 02-16-2009  s/p cath w/ PCI thrombectomy for total occluded RCA , BMS x1 to pRCA;  nuclear stress test 03-15-2012 low risk no ischemia but sig inferior-inferolateral infarct , ef 51%   Dyslipidemia    Essential hypertension    Heart murmur  History of COVID-19 05/2020   per pt mild symptoms that resolved   History of ST elevation myocardial infarction (STEMI) 02/16/2009   inferior STEMI   cath w/ pci w/ BMS to pRCA   History of urinary retention    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[facility]) 04/2021   urologist--- dr borden/ radiation oncology-- dr patrcia;  dx 12/ 2022,  Gleason 4+5, PSA 24.6, volume 94.6cc;  plan to start IMRT   OSA (obstructive sleep apnea)    per pt dx approx 2008, no cpap intolerant   PONV (postoperative nausea and vomiting)    Pre-diabetes    S/p bare metal coronary artery stent    01/ 2006  x1 BMS to pRI (CoStar study stent);  and 09/ 2010  x1 BMS to [facility]    Past Surgical History:  Procedure Laterality Date   CARDIAC CATHETERIZATION  05/27/2004   outpatient by dr burnard [facility] cardiology) for positive stress test;  80% stensis pRI   CORONARY ANGIOPLASTY WITH STENT PLACEMENT  06/18/2004   @[facility]  by Dr Lavon;  PCI w/ BMS to pRI  (CoStar study stent - 2.5 x 16)   CORONARY ANGIOPLASTY WITH STENT PLACEMENT  02/16/2009   @[facility]  by dr verlin;   Inferior STEMI:  PCI w/ thrombectomy total occluded RCA, BMS x1 to pRCA (Vision BMS 3.5 x 23 -> 4.0 mm), residual 40-50% pLAD, ef 45-50%   CORONARY PRESSURE/FFR STUDY N/A 01/17/2024   Procedure: CORONARY PRESSURE/FFR STUDY;  Surgeon: Mady Bruckner, MD;  Location: [facility] INVASIVE CV  LAB;  Service: Cardiovascular;  Laterality: N/A;   CORONARY STENT INTERVENTION N/A 01/17/2024   Procedure: CORONARY STENT INTERVENTION;  Surgeon: Mady Bruckner, MD;  Location: [facility] INV[facility] CV LAB;  Service: Cardiovascular;  Laterality: N/A;   CORONARY ULTRASOUND/IVUS N/A 01/17/2024   Procedure: Coronary Ultrasound/IVUS;  Surgeon: Mady Bruckner, MD;  Location: [facility] INVASIVE CV LAB;  Service: Cardiovascular;  Laterality: N/A;   FLEXIBLE SIGMOIDOSCOPY N/A 10/20/2023   Procedure: KINGSTON SIDE;  Surgeon: Legrand Victory LITTIE DOUGLAS, MD;  Location: [facility] ENDOSCOPY;  Service: Gastroenterology;  Laterality: N/A;   GOLD SEED IMPLANT N/A 09/19/2021   Procedure: GOLD SEED IMPLANT;  Surgeon: Cam Morene ORN, MD;  Location: [facility];  Service: Urology;  Laterality: N/A;  ONLY NEEDS 30 MIN FOR ALL   INGUINAL HERNIA REPAIR Right 09/02/1999   @[facility] ;   recurrent repair right inguinal hernia;   previous repair approx 1990s   INGUINAL HERNIA REPAIR Left    1990s   KNEE ARTHROSCOPY W/ MENISCAL REPAIR Right 12/18/2005   @[facility]    LUMBAR DISC SURGERY  1982   L5--S1   RIGHT/LEFT HEART CATH AND CORONARY ANGIOGRAPHY N/A 01/17/2024   Procedure: RIGHT/LEFT HEART CATH AND CORONARY ANGIOGRAPHY;  Surgeon: Mady Bruckner, MD;  Location: [facility] INVAS[facility] CV LAB;  Service: Cardiovascular;  Laterality: N/A;   SPACE OAR INSTILLATION N/A 09/19/2021   Procedure: SPACE OAR INSTILLATION;  Surgeon: Cam Morene ORN, MD;  Location: [facility];  Service: Urology;  Laterality: N/A;   TOTAL KNEE ARTHROPLASTY Left 10/01/2023   Procedure: ARTHROPLASTY, KNEE, TOTAL;  Surgeon: Kay Kemps, MD;  Location: [facility] ORS;  Service: Orthopedics;  Laterality: Left;    Family History  Problem Relation Age of Onset   Lung cancer Mother    Colon cancer Neg Hx    Esophageal cancer Neg Hx     Prior to Admission medications   Medication Sig Start Date End Date Taking? Authorizing Provider  amiodarone  (PACERONE ) 200 MG  tablet Take  1 tablet (200 mg total) by mouth daily. 01/19/24  Yes Fairy Frames, MD  aspirin  EC 81 MG tablet Take 81 mg by mouth 2 (two) times daily.   Yes [provider]  CALCIUM  PO Take 1 tablet by mouth daily.   Yes [provider]  clotrimazole-betamethasone (LOTRISONE) cream Apply 1 Application topically 2 (two) times daily as needed (skin irritation).   Yes [provider]  Cyanocobalamin  (VITAMIN B-12 PO) Take 1 tablet by mouth daily.   Yes [provider]  furosemide  (LASIX ) 20 MG tablet Take 1 tablet (20 mg total) by mouth daily. 01/19/24  Yes Croitoru, Mihai, MD  metoprolol  succinate (TOPROL -XL) 25 MG 24 hr tablet Take 1 tablet (25 mg total) by mouth daily. 01/19/24  Yes Fairy Frames, MD  Multiple Vitamins-Minerals (MULTIVITAMIN MEN 50+) TABS Take 1 tablet by mouth daily.   Yes [provider]  Probiotic Product (PROBIOTIC PO) Take 1 capsule by mouth daily.   Yes [provider]  rosuvastatin  (CRESTOR ) 20 MG tablet TAKE 1 TABLET(20 MG) BY MOUTH DAILY 11/29/23  Yes Anner Alm ORN, MD  sacubitril -valsartan  (ENTRESTO ) 49-51 MG Take 1 tablet by mouth 2 (two) times daily. 01/19/24  Yes Fairy Frames, MD  spironolactone  (ALDACTONE ) 25 MG tablet Take 0.5 tablets (12.5 mg total) by mouth daily. 01/19/24  Yes Fairy Frames, MD  tamsulosin  (FLOMAX ) 0.4 MG CAPS capsule Take 1 capsule (0.4 mg total) by mouth daily after supper. 11/17/21  Yes Patrcia Cough, MD  ticagrelor  (BRILINTA ) 90 MG TABS tablet Take 1 tablet (90 mg total) by mouth 2 (two) times daily. 01/19/24  Yes Fairy Frames, MD  nitroGLYCERIN  (NITROSTAT ) 0.4 MG SL tablet Place 1 tablet (0.4 mg total) under the tongue every 5 (five) minutes as needed for chest pain. Patient not taking: Reported on 01/23/2024 01/19/24 01/18/25  Fairy Frames, MD  ondansetron  (ZOFRAN -ODT) 4 MG disintegrating tablet Take 1 tablet (4 mg total) by mouth every 8 (eight) hours as needed for nausea or  vomiting. Patient not taking: Reported on 01/23/2024 01/21/24   Anner Alm ORN, MD    Current Facility-Administered Medications  Medication Dose Route Frequency Provider Last Rate Last Admin   amiodarone  (NEXTERONE  PREMIX) 360-4.14 MG/200ML-% (1.8 mg/mL) IV infusion  30 mg/hr Intravenous Continuous Cindie Ole DASEN, MD 16.67 mL/hr at 01/28/24 0547 30 mg/hr at 01/28/24 0547   cangrelor  (KENGREAL ) 50 mg in sodium chloride  0.9 % 250 mL (0.2 mg/mL) infusion  0.75 mcg/kg/min Intravenous Continuous Hershal Vito MATSU, RPH 19.76 mL/hr at 01/28/24 0856 0.75 mcg/kg/min at 01/28/24 9143   Chlorhexidine  Gluconate Cloth 2 % PADS 6 each  6 each Topical Daily Claudene Toribio BROCKS, MD   6 each at 01/28/24 1043   docusate sodium  (COLACE) capsule 100 mg  100 mg Oral BID PRN Smith, Daniel C, MD       feeding supplement (BOOST / RESOURCE BREEZE) liquid 1 Container  1 Container Oral TID BM Vernon Ranks, MD   1 Container at 01/27/24 2005   heparin  injection 5,000 Units  5,000 Units Subcutaneous Q8H Harold Scholz, MD   5,000 Units at 01/28/24 1528   HYDROmorphone  (DILAUDID ) injection 0.5 mg  0.5 mg Intravenous Q4H PRN Pham, Minh Q, RPH-CPP   0.5 mg at 01/28/24 1538   ondansetron  (ZOFRAN ) injection 4 mg  4 mg Intravenous Q6H PRN Harold Scholz, MD   4 mg at 01/28/24 0010   piperacillin -tazobactam (ZOSYN ) IVPB 3.375 g  3.375 g Intravenous Q8H Darci Pore, MD 12.5 mL/hr at 01/28/24 1526  3.375 g at 01/28/24 1526   polyethylene glycol (MIRALAX  / GLYCOLAX ) packet 17 g  17 g Oral Daily PRN Claudene Toribio BROCKS, MD       potassium PHOSPHATE  30 mmol in sodium chloride  0.9 % 500 mL infusion  30 mmol Intravenous Once Dang, Thuy D, [facility]       sodium chloride  flush (NS) 0.9 % injection 10-40 mL  10-40 mL Intracatheter Q12H Claudene Toribio BROCKS, MD   10 mL at 01/28/24 1045   sodium chloride  flush (NS) 0.9 % injection 10-40 mL  10-40 mL Intracatheter PRN Claudene Toribio BROCKS, MD   10 mL at 01/25/24 2121   thiamine  (VITAMIN B1) injection 100 mg   100 mg Intravenous Daily Dang, Thuy D, RPH   100 mg at 01/28/24 1249   TPN ADULT (ION)   Intravenous Continuous TPN Lendell Latanya BIRCH, RPH        Allergies as of 01/22/2024 - Review Complete 01/22/2024  Allergen Reaction Noted   Codeine Nausea And Vomiting 03/03/2013   Latex Rash 10/01/2023    Social History   Socioeconomic History   Marital status: Married    Spouse name: [name]   Number of children: 3   Years of education: Not on file   Highest education level: Not on file  Occupational History   Not on file  Tobacco Use   Smoking status: Never   Smokeless tobacco: Never  Vaping Use   Vaping status: Never Used  Substance and Sexual Activity   Alcohol use: No    Alcohol/week: 0.0 standard drinks of alcohol   Drug use: Never   Sexual activity: Not on file  Other Topics Concern   Not on file  Social History Narrative   He is married, father of 3, grandfather of 5. Never smoked. Does not drink.    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    He does lots of lifting, pulling with ropes, using upper extremities.       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   Social Drivers of Corporate investment banker Strain: Not on file  Food Insecurity: No Food Insecurity (01/27/2024)   Hunger Vital Sign    Worried About Running Out of Food in the Last Year: Never true    Ran Out of Food in the Last Year: Never true  Transportation Needs: No Transportation Needs (01/27/2024)   PRAPARE - Administrator, Civil Service (Medical): No    Lack of Transportation (Non-Medical): No  Physical Activity: Not on file  Stress: Not on file  Social Connections: Socially Integrated (01/27/2024)   Social Connection and Isolation Panel    Frequency of Communication with Friends and Family: More than  three times a week    Frequency of Social Gatherings with Friends and Family: Once a week    Attends Religious Services: More than 4 times per year    Active Member of [employer] or Organizations: No    Attends Engineer, structural: More than 4 times per year    Marital Status: Married  Catering manager Violence: Not At Risk (01/27/2024)   Humiliation, Afraid, Rape, and Kick questionnaire    Fear of Current or Ex-Partner: No    Emotionally Abused: No  Physically Abused: No    Sexually Abused: No     Code Status   Code Status: Full Code  Review of Systems: All systems reviewed and negative except where noted in HPI.  Physical Exam: Vital signs in last 24 hours: Temp:  [97.5 F (36.4 C)-98.6 F (37 C)] 97.9 F (36.6 C) (09/05 1514) Pulse Rate:  [71-109] 81 (09/05 1514) Resp:  [18-24] 19 (09/05 1514) BP: (117-157)/(69-117) 128/74 (09/05 1514) SpO2:  [91 %-98 %] 98 % (09/05 1514) Weight:  [91.1 kg] 91.1 kg (09/05 0907) Last BM Date : 01/27/24  General:  Pleasant male in NAD Psych:  Cooperative. Normal mood and affect Eyes: Pupils equal Ears:  Normal auditory acuity Nose: No deformity, discharge or lesions Neck:  Supple, no masses felt Lungs:  Clear to auscultation.  Heart:  Regular rate  Abdomen:  Soft, largely distended nondistended, nontender, metallic  bowel sounds, no masses felt Rectal :  Deferred Msk: Symmetrical without gross deformities.  Neurologic:  Alert, oriented, grossly normal neurologically Extremities : No edema Skin:  Intact without significant lesions.    Intake/Output from previous day: 09/04 0701 - 09/05 0700 In: 1252.9 [I.V.:811.4; IV Piggyback:441.5] Out: 1000 [Urine:700; Stool:300] Intake/Output this shift:  Total I/O In: -  Out: 300 [Stool:300]   Vina Dasen, NP-C   01/28/2024, 4:19 PM

## 2024-01-28 NOTE — Progress Notes (Signed)
 Mobility Specialist Progress Note:    01/28/24 1142  Mobility  Activity Ambulated with assistance  Level of Assistance Minimal assist, patient does 75% or more  Assistive Device Front wheel walker  Distance Ambulated (ft) 60 ft  Activity Response Tolerated well  Mobility Referral Yes  Mobility visit 1 Mobility  Mobility Specialist Start Time (ACUTE ONLY) 1133  Mobility Specialist Stop Time (ACUTE ONLY) 1142  Mobility Specialist Time Calculation (min) (ACUTE ONLY) 9 min   Pt received in bed, agreeable to mobility session. MinA to stand, CGA for safety while ambulating. Tolerated well, c/o dizziness when first standing, which resolved. Max HR 130 bpm. Ambulated in hallway, activity limited by fecal management system coming loose. Returned pt to room, sitting up at EOB. RN and NT assisting pt, left with all needs met.    Bader Stubblefield Mobility Specialist Please contact via Special educational needs teacher or  Rehab office at [phone]

## 2024-01-29 DIAGNOSIS — Z7189 Other specified counseling: Secondary | ICD-10-CM | POA: Diagnosis not present

## 2024-01-29 DIAGNOSIS — K562 Volvulus: Secondary | ICD-10-CM | POA: Diagnosis not present

## 2024-01-29 DIAGNOSIS — Z515 Encounter for palliative care: Secondary | ICD-10-CM

## 2024-01-29 DIAGNOSIS — K638219 Small intestinal bacterial overgrowth, unspecified: Secondary | ICD-10-CM | POA: Insufficient documentation

## 2024-01-29 DIAGNOSIS — K559 Vascular disorder of intestine, unspecified: Secondary | ICD-10-CM | POA: Diagnosis not present

## 2024-01-29 DIAGNOSIS — K6389 Other specified diseases of intestine: Secondary | ICD-10-CM | POA: Diagnosis not present

## 2024-01-29 LAB — GLUCOSE, CAPILLARY
Glucose-Capillary: 171 mg/dL — ABNORMAL HIGH (ref 70–99)
Glucose-Capillary: 193 mg/dL — ABNORMAL HIGH (ref 70–99)
Glucose-Capillary: 188 mg/dL — ABNORMAL HIGH (ref 70–99)
Glucose-Capillary: 147 mg/dL — ABNORMAL HIGH (ref 70–99)
Glucose-Capillary: 186 mg/dL — ABNORMAL HIGH (ref 70–99)
Glucose-Capillary: 176 mg/dL — ABNORMAL HIGH (ref 70–99)

## 2024-01-29 LAB — CBC
HCT: 31.9 % — ABNORMAL LOW (ref 39.0–52.0)
Hemoglobin: 10.4 g/dL — ABNORMAL LOW (ref 13.0–17.0)
MCH: 28.7 pg (ref 26.0–34.0)
MCHC: 32.6 g/dL (ref 30.0–36.0)
MCV: 87.9 fL (ref 80.0–100.0)
Platelets: 285 K/uL (ref 150–400)
RBC: 3.63 MIL/uL — ABNORMAL LOW (ref 4.22–5.81)
RDW: 17.4 % — ABNORMAL HIGH (ref 11.5–15.5)
WBC: 6.3 K/uL (ref 4.0–10.5)
nRBC: 0 % (ref 0.0–0.2)

## 2024-01-29 LAB — COMPREHENSIVE METABOLIC PANEL WITH GFR
ALT: 16 U/L (ref 0–44)
AST: 14 U/L — ABNORMAL LOW (ref 15–41)
Albumin: 1.9 g/dL — ABNORMAL LOW (ref 3.5–5.0)
Alkaline Phosphatase: 61 U/L (ref 38–126)
Anion gap: 12 (ref 5–15)
BUN: 19 mg/dL (ref 8–23)
CO2: 21 mmol/L — ABNORMAL LOW (ref 22–32)
Calcium: 7.5 mg/dL — ABNORMAL LOW (ref 8.9–10.3)
Chloride: 108 mmol/L (ref 98–111)
Creatinine, Ser: 0.83 mg/dL (ref 0.61–1.24)
GFR, Estimated: >60 mL/min (ref >60)
Glucose, Bld: 176 mg/dL — ABNORMAL HIGH (ref 70–99)
Potassium: 4 mmol/L (ref 3.5–5.1)
Sodium: 141 mmol/L (ref 135–145)
Total Bilirubin: 0.5 mg/dL (ref 0.0–1.2)
Total Protein: 4.7 g/dL — ABNORMAL LOW (ref 6.5–8.1)

## 2024-01-29 LAB — MAGNESIUM: Magnesium: 2.2 mg/dL (ref 1.7–2.4)

## 2024-01-29 LAB — PHOSPHORUS: Phosphorus: 3.4 mg/dL (ref 2.5–4.6)

## 2024-01-29 MED ORDER — METRONIDAZOLE 500 MG PO TABS
500.0000 mg | ORAL_TABLET | Freq: Two times a day (BID) | ORAL | Status: DC
  Administered 2024-01-29 – 2024-02-02 (×8): 500 mg via ORAL
  Filled 2024-01-29 (×10): qty 1

## 2024-01-29 MED ORDER — TRAVASOL 10 % IV SOLN
INTRAVENOUS | Status: AC
  Filled 2024-01-29: qty 624

## 2024-01-29 NOTE — Consult Note (Cosign Needed Addendum)
 Palliative Care Consult Note                                  Date: 01/29/2024   Patient Name: Chase Combs  DOB: May 12, 1946  MRN: [id number]  Age / Sex: 78 y.o., male  PCP: Wonda Worth SQUIBB, PA Referring Physician: Cheryle Page, MD  Reason for Consultation: Establishing goals of care  HPI/Patient Profile: 78 y.o. male  with past medical history of CAD/recent non-STEMI requiring LAD stent placement and discharged from the hospital on 01/19/2024, ischemic cardiomyopathy, recent decrease in EF from 40% to 25% by recent echocardiogram on 01/13/2024 compared to 07/12/2023, new onset A-fib with RVR, hypertension, hypercholesterolemia, diabetes mellitus, obesity admitted on 01/22/2024 with nausea and vomiting.   CT images concerning for intestinal pneumatosis with ischemic versus infectious colitis.  Patient was in septic shock, admitted to ICU under PCCM service.  He was started on broad-spectrum antibiotics.  Cardiology and general surgery consulted.  Patient was started on TPN. GI seeing. 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    Past Medical History:  Diagnosis Date   Abdominal hernia    per pt   Arthritis    hands   Benign localized prostatic hyperplasia with lower urinary tract symptoms (LUTS)    CAD (coronary artery disease) 05/2004   cardiologist--- dr anner;  positive stress test , had outpt cardiac cath 05-27-2004 stenosis RI;  06-18-2004 cath w/ PCI and BMS x1 to pRI;   STEMI 02-16-2009  s/p cath w/ PCI thrombectomy for total occluded RCA , BMS x1 to pRCA;  nuclear stress test 03-15-2012 low risk no ischemia but sig inferior-inferolateral infarct , ef 51%   Dyslipidemia    Essential hypertension    Heart murmur    History of COVID-19 05/2020   per pt mild symptoms that resolved   History of ST elevation myocardial infarction (STEMI) 02/16/2009   inferior STEMI   cath w/ pci w/ BMS to pRCA   History of  urinary retention    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[facility]) 04/2021   urologist--- dr borden/ radiation oncology-- dr patrcia;  dx 12/ 2022,  Gleason 4+5, PSA 24.6, volume 94.6cc;  plan to start IMRT   OSA (obstructive sleep apnea)    per pt dx approx 2008, no cpap intolerant   PONV (postoperative nausea and vomiting)    Pre-diabetes    S/p bare metal coronary artery stent    01/ 2006  x1 BMS to pRI (CoStar study stent);  and 09/ 2010  x1 BMS to pRCA    Subjective:   I have reviewed medical records including EPIC notes, labs and imaging, assessed the patient and then met with the patient and his wife Chase Combs by phone to discuss diagnosis prognosis, GOC, EOL wishes, disposition and options.  Today's Discussion: I introduced Palliative Medicine as specialized medical care for people living with serious illness. It focuses on providing relief from symptoms and stress of a serious illness. The goal is to improve quality of life for both the patient and the family. Patient was initially upset that PMT was consulted to be a part of his care team. We called his wife Chase Combs who encouraged him to meet with us . Discussed patient's chronic conditions, recent hospitalizations, and current hospitalization. Patient shared that his health has declined since his knee replacement 4 months ago. He shares his difficulty with ileus since his postoperative ileus in May.  Prior to his knee replacement the patient was living at home independently with his wife Chase Combs. They have been married 52 years. They have children and grandchildren. The patient worked until very recently. 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 He was looking forward to retirement but has not been able to enjoy it due to being sick. He would like to be able to do simple things like fix things around the house and enjoy his time with Chase Combs.  A discussion was had today regarding advanced  directives. Patient has ACP documents completed at home. He does not want to provide these to the hospital. We discussed code status. Recommended consideration of DNR status, understanding evidenced-based poor outcomes in similar hospitalized patients, as the cause of the arrest is likely associated with chronic/terminal disease rather than a reversible acute cardio-pulmonary event. Patient states he would want to pass away naturally and would not want an attempted resuscitation. Changed to DNR. Patient states he wants to continue treating the treatable and remains full scope of care.  Discussed the importance of continued conversation with family and the medical providers regarding overall plan of care and treatment options, ensuring decisions are within the context of the patient's values and GOCs.  Questions and concerns were addressed. Hard Choices booklet left for review. The family was encouraged to call with questions or concerns. PMT will continue to support holistically.  Review of Systems  Constitutional:  Positive for fatigue.  Gastrointestinal:  Positive for nausea and vomiting.    Objective:   Primary Diagnoses: Present on Admission:  Intestinal ischemia (HCC)  S/p ST elevation myocardial infarction (STEMI) of inferior wall (September 2010)  Essential hypertension  Hyperlipidemia associated with type 2 diabetes mellitus [facility])   Physical Exam Vitals reviewed.  Constitutional:      General: He is not in acute distress. HENT:     Head: Normocephalic and atraumatic.  Cardiovascular:     Rate and Rhythm: Normal rate.  Pulmonary:     Effort: Pulmonary effort is normal.  Skin:    General: Skin is warm and dry.  Neurological:     Mental Status: He is alert and oriented to person, place, and time.  Psychiatric:        Mood and Affect: Affect is angry.     Vital Signs:  BP (!) 156/82 (BP Location: Right Leg)   Pulse (!) 165   Temp 98.6 F (37 C) (Oral)   Resp 20   Ht  6' (1.829 m)   Wt 95.5 kg   SpO2 97%   BMI 28.55 kg/m     Advanced Care Planning:   Existing Vynca/ACP Documentation: None  Primary Decision Maker: PATIENT  Code Status/Advance Care Planning: DNR  Assessment & Plan:   SUMMARY OF RECOMMENDATIONS   Changed to DNR- will have form scanned into Vynca Full scope of care PMT to support as needed.  Discussed with: bedside RN and Dr. Cheryle  Time Total: 75 minutes    Thank you for allowing us  to participate in the care of AGASTYA MEISTER PMT will continue to support holistically.   Signed by: Stephane Palin, NP Palliative Medicine Team  Team Phone # [phone] (Nights/Weekends)  01/29/2024, 3:55 PM

## 2024-01-29 NOTE — Plan of Care (Signed)
  Problem: Education: Goal: Knowledge of General Education information will improve Description: Including pain rating scale, medication(s)/side effects and non-pharmacologic comfort measures Outcome: Progressing   Problem: Clinical Measurements: Goal: Ability to maintain clinical measurements within normal limits will improve Outcome: Progressing Goal: Diagnostic test results will improve Outcome: Progressing Goal: Respiratory complications will improve Outcome: Progressing Goal: Cardiovascular complication will be avoided Outcome: Progressing   Problem: Nutrition: Goal: Adequate nutrition will be maintained Outcome: Progressing   Problem: Coping: Goal: Level of anxiety will decrease Outcome: Progressing   Problem: Elimination: Goal: Will not experience complications related to urinary retention Outcome: Progressing   Problem: Pain Managment: Goal: General experience of comfort will improve and/or be controlled Outcome: Progressing   Problem: Safety: Goal: Ability to remain free from injury will improve Outcome: Progressing   Problem: Skin Integrity: Goal: Risk for impaired skin integrity will decrease Outcome: Progressing   Problem: Coping: Goal: Ability to adjust to condition or change in health will improve Outcome: Progressing   Problem: Fluid Volume: Goal: Ability to maintain a balanced intake and output will improve Outcome: Progressing   Problem: Metabolic: Goal: Ability to maintain appropriate glucose levels will improve Outcome: Progressing   Problem: Nutritional: Goal: Maintenance of adequate nutrition will improve Outcome: Progressing   Problem: Skin Integrity: Goal: Risk for impaired skin integrity will decrease Outcome: Progressing   Problem: Tissue Perfusion: Goal: Adequacy of tissue perfusion will improve Outcome: Progressing

## 2024-01-29 NOTE — Progress Notes (Signed)
 PROGRESS NOTE    Chase Combs  FMW:[id number] DOB: [birth year] DOA: 01/22/2024 PCP: Wonda Worth SQUIBB, PA   Brief Narrative:  78 year old male with history of ischemic cardiomyopathy with recent EF of 25%, CAD/recent non-STEMI requiring LAD stent placement and discharged from the hospital on 01/19/2024, hypertension, hyperlipidemia, diabetes mellitus type 2, obesity and paroxysmal presented with nausea, vomiting with CT images concerning for intestinal pneumatosis with ischemic versus infectious colitis.  Patient was in septic shock, admitted to ICU under PCCM service.  He was started on broad-spectrum antibiotics.  Cardiology and general surgery consulted.  Patient was started on TPN.  Subsequently, GI was consulted on 01/28/2024.  Care transferred to TRH service from 01/29/2024 onwards.  Assessment & Plan:   Septic shock: Present on admission: Due to intra-abdominal source; resolved Severe intestinal pneumatosis with ischemic versus infectious colitis/ileus - General Surgery following: Recommending conservative management.  X-ray of abdomen on 01/28/2024 showed diffuse gaseous distention of the entire colon.  GI was subsequently consulted. - Off pressors.  Care transferred to TRH service from 01/29/2024 onwards. - Patient has completed 7 days of IV Zosyn ; antibiotic discontinued on 01/28/2024 - Currently on TPN.  Diet advancement as per GI and general surgery  Possible aspiration pneumonia - Currently on room air.  Completed 1 week course of antibiotics as discussed above.  Nonoliguric AKI due to septic ATN -Creatinine 2.55 on presentation.  Improved with IV fluids.  Creatinine 0.83 this morning.  New onset systolic heart failure/suspected ischemic cardiomyopathy CAD with recent LAD stent - Echo on 01/13/2024 during last hospitalization had shown EF of 25 to 30%.  Strict input and output.  Daily weights.  Fluid restriction. - Cardiology following.  Currently on Aggrastat.  Not having any chest  pain.  Paroxysmal A-fib with RVR - Currently rate controlled.  Currently on amiodarone  drip.  Anticoagulation on hold in case patient needs surgical intervention.  Hyponatremia -Resolved  Hypokalemia -Resolved  Acute metabolic acidosis - Mild.  Monitor.  Anemia of chronic disease - From chronic illnesses.  Hemoglobin stable.  Monitor intermittently  Goals of care - Overall prognosis is guarded to poor.  Remains full code.  Consult palliative care for goals of care discussion   DVT prophylaxis: Heparin  subcutaneous Code Status: Full Family Communication: None at bedside Disposition Plan: Status is: Inpatient Remains inpatient appropriate because: Of severity of illness  Consultants: Cardiology/EP/general surgery/GI.  Consult palliative care  Procedures: None  Antimicrobials:  Anti-infectives (From admission, onward)    Start     Dose/Rate Route Frequency Ordered Stop   01/22/24 2200  piperacillin -tazobactam (ZOSYN ) IVPB 3.375 g        3.375 g 12.5 mL/hr over 240 Minutes Intravenous Every 8 hours 01/22/24 1602 01/28/24 2359   01/22/24 1800  metroNIDAZOLE  (FLAGYL ) IVPB 500 mg  Status:  Discontinued        500 mg 100 mL/hr over 60 Minutes Intravenous Every 8 hours 01/22/24 1653 01/23/24 0857   01/22/24 1545  metroNIDAZOLE  (FLAGYL ) IVPB 500 mg  Status:  Discontinued        500 mg 100 mL/hr over 60 Minutes Intravenous  Once 01/22/24 1533 01/22/24 1634   01/22/24 1330  doxycycline (VIBRAMYCIN) 100 mg in sodium chloride  0.9 % 250 mL IVPB  Status:  Discontinued        100 mg 125 mL/hr over 120 Minutes Intravenous  Once 01/22/24 1320 01/22/24 1649   01/22/24 1315  azithromycin (ZITHROMAX) 500 mg in sodium chloride  0.9 % 250 mL IVPB  Status:  Discontinued        500 mg 250 mL/hr over 60 Minutes Intravenous  Once 01/22/24 1309 01/22/24 1319   01/22/24 1315  ceFEPIme  (MAXIPIME ) 2 g in sodium chloride  0.9 % 100 mL IVPB        2 g 200 mL/hr over 30 Minutes Intravenous  Once  01/22/24 1309 01/22/24 1515   01/22/24 1315  vancomycin  (VANCOREADY) IVPB 1750 mg/350 mL  Status:  Discontinued        1,750 mg 175 mL/hr over 120 Minutes Intravenous  Once 01/22/24 1311 01/22/24 1756         Subjective: Patient seen and examined at bedside.  Denies worsening Abrol pain, vomiting.  Passing gas.  Objective: Vitals:   01/28/24 2011 01/28/24 2316 01/29/24 0244 01/29/24 0748  BP: (!) 151/72 (!) 154/108 (!) 154/65 (!) 162/95  Pulse: 74 62 83 91  Resp: 20 20 19 13   Temp: 97.8 F (36.6 C) (!) 97.4 F (36.3 C) 97.9 F (36.6 C) 98.5 F (36.9 C)  TempSrc: Oral Axillary Oral Oral  SpO2: 96% 100% 96% 95%  Weight:   95.5 kg   Height:        Intake/Output Summary (Last 24 hours) at 01/29/2024 0820 Last data filed at 01/29/2024 0546 Gross per 24 hour  Intake 318.44 ml  Output 900 ml  Net -581.56 ml   Filed Weights   01/28/24 0500 01/28/24 0907 01/29/24 0244  Weight: 91.1 kg 91.1 kg 95.5 kg    Examination:  General exam: Appears calm and comfortable.  Chronically ill and deconditioned looking Respiratory system: Bilateral decreased breath sounds at bases Cardiovascular system: S1 & S2 heard, Rate controlled Gastrointestinal system: Abdomen is distended, soft and nontender.  Bowel sounds sluggish  extremities: No cyanosis, clubbing; trace lower extremity edema present Central nervous system: Alert and oriented.  Slow to respond.  No focal neurological deficits. Moving extremities Skin: No rashes, lesions or ulcers Psychiatry: Flat affect.  Not agitated    Data Reviewed: I have personally reviewed following labs and imaging studies  CBC: Recent Labs  Lab 01/22/24 1243 01/22/24 1259 01/25/24 0752 01/26/24 0439 01/27/24 0539 01/28/24 0400 01/29/24 0339  WBC 2.4*   < > 4.7 5.5 6.2 6.8 6.3  NEUTROABS 1.7  --   --   --   --   --   --   HGB 13.9   < > 10.7* 10.7* 10.2* 10.4* 10.4*  HCT 43.2   < > 33.7* 33.2* 31.1* 31.8* 31.9*  MCV 88.5   < > 89.4 88.5 87.6  88.1 87.9  PLT 489*   < > 297 299 298 300 285   < > = values in this interval not displayed.   Basic Metabolic Panel: Recent Labs  Lab 01/22/24 2159 01/23/24 1111 01/24/24 0755 01/25/24 0752 01/26/24 0439 01/26/24 1832 01/27/24 0539 01/28/24 0400 01/29/24 0339  NA 135 133*   < > 132* 134*  --  140 139 141  K 2.6* 3.0*   < > 4.6 2.7* 3.7 3.3* 3.9 4.0  CL 91* 96*   < > 98 102  --  103 106 108  CO2 29 26   < > 20* 22  --  21* 24 21*  GLUCOSE 137* 106*   < > 176* 92  --  164* 128* 176*  BUN 69* 58*   < > 62* 68*  --  47* 31* 19  CREATININE 2.14* 1.50*   < > 1.96* 1.99*  --  1.46* 1.07  0.83  CALCIUM  7.8* 7.6*   < > 7.5* 7.6*  --  7.5* 7.6* 7.5*  MG 2.1 2.0  --   --  3.0*  --   --  2.4 2.2  PHOS  --   --   --   --  3.3  --   --  1.9* 3.4   < > = values in this interval not displayed.   GFR: Estimated Creatinine Clearance: 90.8 mL/min (by C-G formula based on SCr of 0.83 mg/dL). Liver Function Tests: Recent Labs  Lab 01/25/24 0752 01/26/24 0439 01/27/24 0539 01/28/24 0400 01/29/24 0339  AST 17 14* 14* 14* 14*  ALT 24 21 20 18 16   ALKPHOS 55 52 59 58 61  BILITOT 0.8 0.9 0.9 0.6 0.5  PROT 4.3* 4.4* 5.4* 5.0* 4.7*  ALBUMIN 1.7* 1.7* 1.8* 1.8* 1.9*   No results for input(s): LIPASE, AMYLASE in the last 168 hours. No results for input(s): AMMONIA in the last 168 hours. Coagulation Profile: No results for input(s): INR, PROTIME in the last 168 hours. Cardiac Enzymes: No results for input(s): CKTOTAL, CKMB, CKMBINDEX, TROPONINI in the last 168 hours. BNP (last 3 results) Recent Labs    01/07/24 2301  PROBNP 15,245.0*   HbA1C: No results for input(s): HGBA1C in the last 72 hours. CBG: Recent Labs  Lab 01/28/24 1512 01/28/24 2009 01/28/24 2314 01/29/24 0246 01/29/24 0749  GLUCAP 202* 218* 158* 171* 193*   Lipid Profile: Recent Labs    01/28/24 0400  TRIG 98   Thyroid  Function Tests: No results for input(s): TSH, T4TOTAL, FREET4,  T3FREE, THYROIDAB in the last 72 hours. Anemia Panel: No results for input(s): VITAMINB12, FOLATE, FERRITIN, TIBC, IRON, RETICCTPCT in the last 72 hours. Sepsis Labs: Recent Labs  Lab 01/22/24 1243 01/22/24 1300 01/22/24 1500 01/24/24 1002 01/24/24 1857  PROCALCITON 1.23  --   --   --   --   LATICACIDVEN  --  3.4* 1.9 1.1 1.1    Recent Results (from the past 240 hours)  Culture, blood (routine x 2)     Status: None   Collection Time: 01/22/24 12:43 PM   Specimen: BLOOD RIGHT ARM  Result Value Ref Range Status   Specimen Description BLOOD RIGHT ARM  Final   Special Requests   Final    BOTTLES DRAWN AEROBIC AND ANAEROBIC Blood Culture adequate volume   Culture   Final    NO GROWTH 5 DAYS Performed at [facility], [address] [place], [place] [address]    Report Status 01/27/2024 FINAL  Final  Culture, blood (routine x 2)     Status: None   Collection Time: 01/22/24 12:48 PM   Specimen: BLOOD RIGHT HAND  Result Value Ref Range Status   Specimen Description BLOOD RIGHT HAND  Final   Special Requests   Final    BOTTLES DRAWN AEROBIC ONLY Blood Culture results may not be optimal due to an inadequate volume of blood received in culture bottles   Culture   Final    NO GROWTH 5 DAYS Performed at [facility], [address], [place], [place] [address]    Report Status 01/27/2024 FINAL  Final  C Difficile Quick Screen w PCR reflex     Status: None   Collection Time: 01/22/24  1:10 PM   Specimen: STOOL  Result Value Ref Range Status   C Diff antigen NEGATIVE NEGATIVE Final   C Diff toxin NEGATIVE NEGATIVE Final   C Diff  interpretation No C. difficile detected.  Final    Comment: Performed at [facility], [address], [place], [place] [address]  MRSA Next Gen by PCR, Nasal     Status: None   Collection Time: 01/22/24  1:10 PM   Specimen: Nasal Mucosa; Nasal Swab  Result Value Ref Range Status   MRSA by PCR Next Gen NOT DETECTED NOT  DETECTED Final    Comment: (NOTE) The GeneXpert MRSA Assay (FDA approved for NASAL specimens only), is one component of a comprehensive MRSA colonization surveillance program. It is not intended to diagnose MRSA infection nor to guide or monitor treatment for MRSA infections. Test performance is not FDA approved in patients less than 40 years old. Performed at [facility], [address] [place], [place] [address]          Radiology Studies: DG Abd 1 View Result Date: 01/28/2024 CLINICAL DATA:  [address] [facility]) [phone] EXAM: ABDOMEN - 1 VIEW COMPARISON:  January 25, 2024, January 22, 2024 FINDINGS: The stomach is not evaluated.Similar diffuse gaseous distension of the entire colon with minimal rectal gas present. Generalized paucity of small bowel gas throughout the central abdomen.No pneumoperitoneum. Multilevel degenerative disc disease of the spine. IMPRESSION: Similar diffuse gaseous distension of the entire colon. Electronically Signed   By: Rogelia Myers M.D.   On: 01/28/2024 10:01        Scheduled Meds:  Chlorhexidine  Gluconate Cloth  6 each Topical Daily   feeding supplement  1 Container Oral TID BM   heparin  injection (subcutaneous)  5,000 Units Subcutaneous Q8H   sodium chloride  flush  10-40 mL Intracatheter Q12H   thiamine  (VITAMIN B1) injection  100 mg Intravenous Daily   Continuous Infusions:  amiodarone  30 mg/hr (01/29/24 0739)   cangrelor  (KENGREAL ) 50 mg in sodium chloride  0.9 % 250 mL (0.2 mg/mL) infusion 0.75 mcg/kg/min (01/28/24 2155)   TPN ADULT (ION) 30 mL/hr at 01/28/24 1800   TPN ADULT (ION)            Sophie Mao, MD [facility] 01/29/2024, 8:20 AM

## 2024-01-29 NOTE — Progress Notes (Signed)
  Progress Note  Patient Name: Chase Combs Date of Encounter: 01/29/2024 [facility] Cardiologist: Alm Clay, MD   Interval Summary   Feeling well without acute complaint.  Vital Signs Vitals:   01/28/24 2011 01/28/24 2316 01/29/24 0244 01/29/24 0748  BP: (!) 151/72 (!) 154/108 (!) 154/65 (!) 162/95  Pulse: 74 62 83 91  Resp: 20 20 19 13   Temp: 97.8 F (36.6 C) (!) 97.4 F (36.3 C) 97.9 F (36.6 C) 98.5 F (36.9 C)  TempSrc: Oral Axillary Oral Oral  SpO2: 96% 100% 96% 95%  Weight:   95.5 kg   Height:        Intake/Output Summary (Last 24 hours) at 01/29/2024 1017 Last data filed at 01/29/2024 0546 Gross per 24 hour  Intake 318.44 ml  Output 900 ml  Net -581.56 ml      01/29/2024    2:44 AM 01/28/2024    9:07 AM 01/28/2024    5:00 AM  Last 3 Weights  Weight (lbs) 210 lb 8.6 oz 200 lb 14.5 oz 200 lb 14.5 oz  Weight (kg) 95.5 kg 91.13 kg 91.13 kg      Telemetry/ECG  Atrial fibrillation- Personally Reviewed  Physical Exam  GEN: No acute distress.   Neck: No JVD Cardiac: Irregular, no murmurs, rubs, or gallops.  Respiratory: Clear to auscultation bilaterally. GI: Soft, nontender, non-distended  MS: No edema  Assessment & Plan  1.  Coronary artery disease with recent stent 2.  Atrial fibrillation with rapid response.   3.  New onset systolic heart failure 4.  Ischemic colitis  Patient remains on Aggrastat.  Not having any chest pain.  [name] need to be switched to Plavix .  Would continue IV amiodarone  for rate control at this point.  [name] likely need anticoagulation again once taking oral medications.   For questions or updates, please contact [facility] Please consult [email or web address] for contact info under       Signed, [name], MD

## 2024-01-29 NOTE — Plan of Care (Signed)
  Problem: Education: Goal: Knowledge of General Education information will improve Description: Including pain rating scale, medication(s)/side effects and non-pharmacologic comfort measures Outcome: Progressing   Problem: Clinical Measurements: Goal: Ability to maintain clinical measurements within normal limits will improve Outcome: Progressing Goal: Will remain free from infection Outcome: Progressing Goal: Diagnostic test results will improve Outcome: Progressing Goal: Respiratory complications will improve Outcome: Progressing Goal: Cardiovascular complication will be avoided Outcome: Progressing   Problem: Activity: Goal: Risk for activity intolerance will decrease Outcome: Progressing   Problem: Nutrition: Goal: Adequate nutrition will be maintained Outcome: Progressing   Problem: Coping: Goal: Level of anxiety will decrease Outcome: Progressing   Problem: Pain Managment: Goal: General experience of comfort will improve and/or be controlled Outcome: Progressing   Problem: Skin Integrity: Goal: Risk for impaired skin integrity will decrease Outcome: Progressing

## 2024-01-29 NOTE — Progress Notes (Signed)
 Inpatient Progress Note     Patient Profile/Chief Complaint  78 year old gentleman with history of prolonged postop ileus 09/2023 status post knee replacement and NSTEMI 01/17/2024 status post PCI and DES admitted to the hospital 01/22/2024 with severe septic/septic shock.  CTAP with intestinal pneumatosis concerning for ischemic colitis -no surgical intervention required.  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   Interval History   -- Abdomen remains soft with minimal clinical distention on physical exam -- Flexi-Seal in place with brown stool -300 cc recorded yesterday -- No abdominal pain, nausea or vomiting    Objective   Vital signs in last 24 hours: Temp:  [97.4 F (36.3 C)-98.6 F (37 C)] 98.3 F (36.8 C) (09/06 1605) Pulse Rate:  [62-165] 97 (09/06 1605) Resp:  [13-20] 20 (09/06 1605) BP: (147-162)/(65-108) 147/89 (09/06 1605) SpO2:  [95 %-100 %] 98 % (09/06 1605) Weight:  [95.5 kg] 95.5 kg (09/06 0244) Last BM Date : 01/28/24 General:    Elderly gentleman resting quietly in bed Heart:  Regular rate and rhythm; no murmurs Lungs: Respirations even and unlabored, lungs CTA bilaterally Abdomen:  Soft, nontender minimal distention, hypoactive bowel sounds Extremities:  Without edema. Neurologic:  Alert and oriented,  grossly normal neurologically. Psych:  Cooperative. Normal mood and affect.  Intake/Output from previous day: 09/05 0701 - 09/06 0700 In: 318.4 [I.V.:318.4] Out: 900 [Urine:600; Stool:300] Intake/Output this shift: No intake/output data recorded.  Lab Results: Recent Labs    01/27/24 0539 01/28/24 0400 01/29/24 0339  WBC 6.2 6.8 6.3  HGB 10.2* 10.4* 10.4*  HCT 31.1* 31.8* 31.9*  PLT 298 300 285   BMET Recent Labs    01/27/24 0539 01/28/24 0400 01/29/24 0339  NA 140 139 141  K 3.3* 3.9 4.0  CL 103 106 108  CO2 21* 24 21*  GLUCOSE 164* 128* 176*  BUN 47* 31* 19   CREATININE 1.46* 1.07 0.83  CALCIUM  7.5* 7.6* 7.5*   LFT Recent Labs    01/29/24 0339  PROT 4.7*  ALBUMIN 1.9*  AST 14*  ALT 16  ALKPHOS 61  BILITOT 0.5   PT/INR No results for input(s): LABPROT, INR in the last 72 hours.  Studies/Results: DG Abd 1 View Result Date: 01/28/2024 CLINICAL DATA:  [id number] Ileus [facility]) [id number] EXAM: ABDOMEN - 1 VIEW COMPARISON:  January 25, 2024, January 22, 2024 FINDINGS: The stomach is not evaluated.Similar diffuse gaseous distension of the entire colon with minimal rectal gas present. Generalized paucity of small bowel gas throughout the central abdomen.No pneumoperitoneum. Multilevel degenerative disc disease of the spine. IMPRESSION: Similar diffuse gaseous distension of the entire colon. Electronically Signed   By: Rogelia Myers M.D.   On: 01/28/2024 10:01    Endoscopic Studies: None   Clinical Impression   78 year old gentleman with history of prolonged postop ileus 09/2023 status post knee replacement and NSTEMI 01/17/2024 status post PCI and DES admitted to the hospital 01/22/2024 with severe septic/septic shock.  CTAP with intestinal pneumatosis concerning for ischemic colitis -no surgical intervention required.  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  Dilated loops of bowel are seen on CT with gaseous distention and loose stool.  During previous hospitalization in May 2025 he received numerous treatments including Relistor , Questran  and metronidazole  for possible SIBO.  He had substantial improvement in his symptoms after trial of metronidazole  at that time.  He appears overall clinically stable today.  Electrolytes are within normal limits.  Taking minimal narcotic medication in the form of Dilaudid .  No anticholinergics.  Given his previous dramatic clinical improvement with metronidazole  for presumed SIBO contributing to gaseous distention/dilated bowel loops last  admission we have recommended a repeat trial of this to see how he may fare.  If this is not effective would consider potential trial of Mestinon to augment colonic motility.    Plan  Maintain stable electrolytes-potassium, phosphorus, magnesium  Start metronidazole  500 mg p.o. twice daily Limit narcotics as possible If no improvement will consider trial of Mestinon to augment colonic motility.     LOS: 7 days   Chase Combs  01/29/2024, 4:17 PM  Chase Hausen, MD [facility]

## 2024-01-29 NOTE — Progress Notes (Signed)
 Chase Combs is noted to be having bloody bowel movements, both Dr Cheryle and Dr Johnita Cruz has been notified.

## 2024-01-29 NOTE — Progress Notes (Signed)
 PHARMACY - TOTAL PARENTERAL NUTRITION CONSULT NOTE  Indication: Prolonged ileus  Patient Measurements: Height: 6' (182.9 cm) Weight: 95.5 kg (210 lb 8.6 oz) IBW/kg (Calculated) : 77.6 TPN AdjBW (KG): 91.1 Body mass index is 28.55 kg/m.  Assessment:  72 YOM presented on 8/30 with abdominal pain, found to have adynamic ileus and possible ischemic colitis.  Patient stated his last meal was on 8/30 and was likely losing weight PTA, but unable to quantify.  He was started on a clear liquid diet on 9/3 PM and hasn't been able to advance.  Pharmacy consulted to manage TPN.  He is at risk for refeeding syndrome.  Glucose / Insulin : no hx DM - CBGs variable (120-200s) Used 1 units SSI charted in the past 24 hrs Electrolytes: K 4.0 s/p KCL x2 runs (goal >/= 4, has required a significant amount of KCL runs since admission, up to 20 runs on day one), Phos 3.4 , others WNL Renal: SCr 0.83, BUN 19 Hepatic: LFTs / tbili / TG WNL, albumin 1.9 Intake / Output; MIVF: UOP , stool GI Imaging: 9/5 KUB: diffuse gaseous distension of entire colon GI Surgeries / Procedures: none since TPN initiation   Central access: PICC placed 01/24/24 TPN start date: 01/28/24  Nutritional Goals: Goal TPN rate is 80 mL/hr (provides 100g AA and 1965 kCal per day)  RD Estimated Needs Total Energy Estimated Needs: 1900-2100 kcals Total Protein Estimated Needs: 95-110g Total Fluid Estimated Needs: 1.9-2.1L/day  Current Nutrition:  TPN CLD started 9/3  Plan:  Advance TPN to 50 mL/hr at 1800 (goal rate 80 ml/hr) Electrolytes in TPN: Na 32 mEq/L, K 48 mEq/L, Ca 3 mEq/L, Mg 3 mEq/L, Phos 9 mmol/L, Cl:Ac 1:2 to keep consistent w rate advancement Continue standard MVI and trace elements to TPN Continue SSI moderate Q4H  Thiamine  100mg  IV daily x 5 (last dose 9/9) Monitor TPN labs on Mon/Thurs -  will obtain labs in AM   Sharyne Glatter, PharmD, BCCCP Critical Care Clinical Pharmacist 01/29/2024 6:59 AM

## 2024-01-29 NOTE — Progress Notes (Addendum)
 Intestinal ischemia (HCC)  Subjective: Alert. NAD. Having BMs. Tolerating clears. Does not like broth.    Objective: Vital signs in last 24 hours: Temp:  [97.4 F (36.3 C)-98.6 F (37 C)] 98.5 F (36.9 C) (09/06 0748) Pulse Rate:  [62-91] 91 (09/06 0748) Resp:  [13-23] 13 (09/06 0748) BP: (128-162)/(65-108) 162/95 (09/06 0748) SpO2:  [95 %-100 %] 95 % (09/06 0748) Weight:  [95.5 kg] 95.5 kg (09/06 0244) Last BM Date : 01/28/24  Intake/Output from previous day: 09/05 0701 - 09/06 0700 In: 318.4 [I.V.:318.4] Out: 900 [Urine:600; Stool:300] Intake/Output this shift: No intake/output data recorded.  Gen: alert, cooperative NAD CV: RRR Pulm: normal effort  Abd: soft, moderate distension, patient states this is improving. Fat containing umbilical hernia  Lab Results:  Reviewed personally   Studies/Results No new imaging  MEDS, Scheduled  Chlorhexidine  Gluconate Cloth  6 each Topical Daily   feeding supplement  1 Container Oral TID BM   heparin  injection (subcutaneous)  5,000 Units Subcutaneous Q8H   sodium chloride  flush  10-40 mL Intracatheter Q12H   thiamine  (VITAMIN B1) injection  100 mg Intravenous Daily     Assessment: Intestinal ischemia (HCC) Colonic ileus, possible ischemic colitis in the setting of recent cardiac stent to LAD on Brilinta  (held)   Plan: -  Adynamic ileus is clinically improving in some ways, decreased pain, and slightly improved distension but still moderately distended clinically and on KUB -  CLD - KUB 9/5 with ongoing distention and GI was consulted. No emergent surgical needs. No clinical evidence of bowel ischemia. Mgmt of ileus and diet advancement per GI. CCS will sign off. Call as needed.    Per primary team --  AKI  CAD s/p DES to LAD - IV cangrelor   Afib/RVR - amio gtt  Chronic systolic CHF Leukopenia - WBC 4    LOS: 7 days    Chase Combs, [facility] Please see Amion for pager number during day  hours 7:00am-4:30pm       Patient's medical decision making was moderate    01/29/2024 10:39 AM

## 2024-01-30 ENCOUNTER — Inpatient Hospital Stay (HOSPITAL_COMMUNITY)

## 2024-01-30 DIAGNOSIS — D62 Acute posthemorrhagic anemia: Secondary | ICD-10-CM | POA: Insufficient documentation

## 2024-01-30 DIAGNOSIS — K567 Ileus, unspecified: Secondary | ICD-10-CM | POA: Diagnosis not present

## 2024-01-30 DIAGNOSIS — K922 Gastrointestinal hemorrhage, unspecified: Secondary | ICD-10-CM | POA: Insufficient documentation

## 2024-01-30 DIAGNOSIS — K559 Vascular disorder of intestine, unspecified: Secondary | ICD-10-CM | POA: Diagnosis not present

## 2024-01-30 DIAGNOSIS — Z7189 Other specified counseling: Secondary | ICD-10-CM | POA: Diagnosis not present

## 2024-01-30 DIAGNOSIS — R197 Diarrhea, unspecified: Secondary | ICD-10-CM | POA: Diagnosis not present

## 2024-01-30 LAB — BASIC METABOLIC PANEL WITH GFR
Anion gap: 5 (ref 5–15)
BUN: 16 mg/dL (ref 8–23)
CO2: 26 mmol/L (ref 22–32)
Calcium: 7.4 mg/dL — ABNORMAL LOW (ref 8.9–10.3)
Chloride: 106 mmol/L (ref 98–111)
Creatinine, Ser: 0.7 mg/dL (ref 0.61–1.24)
GFR, Estimated: >60 mL/min (ref >60)
Glucose, Bld: 188 mg/dL — ABNORMAL HIGH (ref 70–99)
Potassium: 3.7 mmol/L (ref 3.5–5.1)
Sodium: 137 mmol/L (ref 135–145)

## 2024-01-30 LAB — MAGNESIUM: Magnesium: 1.9 mg/dL (ref 1.7–2.4)

## 2024-01-30 LAB — CBC
HCT: 23.4 % — ABNORMAL LOW (ref 39.0–52.0)
Hemoglobin: 7.8 g/dL — ABNORMAL LOW (ref 13.0–17.0)
MCH: 30.6 pg (ref 26.0–34.0)
MCHC: 33.3 g/dL (ref 30.0–36.0)
MCV: 91.8 fL (ref 80.0–100.0)
Platelets: 257 K/uL (ref 150–400)
RBC: 2.55 MIL/uL — ABNORMAL LOW (ref 4.22–5.81)
RDW: 18.1 % — ABNORMAL HIGH (ref 11.5–15.5)
WBC: 7.3 K/uL (ref 4.0–10.5)
nRBC: 0 % (ref 0.0–0.2)

## 2024-01-30 LAB — GLUCOSE, CAPILLARY
Glucose-Capillary: 179 mg/dL — ABNORMAL HIGH (ref 70–99)
Glucose-Capillary: 181 mg/dL — ABNORMAL HIGH (ref 70–99)
Glucose-Capillary: 155 mg/dL — ABNORMAL HIGH (ref 70–99)
Glucose-Capillary: 150 mg/dL — ABNORMAL HIGH (ref 70–99)
Glucose-Capillary: 171 mg/dL — ABNORMAL HIGH (ref 70–99)

## 2024-01-30 LAB — CBC WITH DIFFERENTIAL/PLATELET
Abs Immature Granulocytes: 0.09 K/uL — ABNORMAL HIGH (ref 0.00–0.07)
Basophils Absolute: 0 K/uL (ref 0.0–0.1)
Basophils Relative: 0 %
Eosinophils Absolute: 0.1 K/uL (ref 0.0–0.5)
Eosinophils Relative: 2 %
HCT: 23.5 % — ABNORMAL LOW (ref 39.0–52.0)
Hemoglobin: 7.7 g/dL — ABNORMAL LOW (ref 13.0–17.0)
Immature Granulocytes: 1 %
Lymphocytes Relative: 11 %
Lymphs Abs: 0.7 K/uL (ref 0.7–4.0)
MCH: 30.2 pg (ref 26.0–34.0)
MCHC: 32.8 g/dL (ref 30.0–36.0)
MCV: 92.2 fL (ref 80.0–100.0)
Monocytes Absolute: 0.3 K/uL (ref 0.1–1.0)
Monocytes Relative: 5 %
Neutro Abs: 5.3 K/uL (ref 1.7–7.7)
Neutrophils Relative %: 81 %
Platelets: 251 K/uL (ref 150–400)
RBC: 2.55 MIL/uL — ABNORMAL LOW (ref 4.22–5.81)
RDW: 18.1 % — ABNORMAL HIGH (ref 11.5–15.5)
WBC: 6.5 K/uL (ref 4.0–10.5)
nRBC: 0 % (ref 0.0–0.2)

## 2024-01-30 LAB — HEMOGLOBIN AND HEMATOCRIT, BLOOD
HCT: 24.7 % — ABNORMAL LOW (ref 39.0–52.0)
Hemoglobin: 8 g/dL — ABNORMAL LOW (ref 13.0–17.0)

## 2024-01-30 LAB — PHOSPHORUS: Phosphorus: 2.9 mg/dL (ref 2.5–4.6)

## 2024-01-30 MED ORDER — TRAVASOL 10 % IV SOLN
INTRAVENOUS | Status: AC
  Filled 2024-01-30: qty 998.4

## 2024-01-30 MED ORDER — POTASSIUM CHLORIDE 10 MEQ/50ML IV SOLN
10.0000 meq | INTRAVENOUS | Status: AC
  Administered 2024-01-30 (×2): 10 meq via INTRAVENOUS
  Filled 2024-01-30 (×4): qty 50

## 2024-01-30 MED ORDER — IOHEXOL 350 MG/ML SOLN
100.0000 mL | Freq: Once | INTRAVENOUS | Status: AC | PRN
  Administered 2024-01-30: 100 mL via INTRAVENOUS

## 2024-01-30 MED ORDER — PANTOPRAZOLE SODIUM 40 MG IV SOLR
40.0000 mg | Freq: Two times a day (BID) | INTRAVENOUS | Status: DC
  Administered 2024-01-30 – 2024-02-10 (×24): 40 mg via INTRAVENOUS
  Filled 2024-01-30 (×25): qty 10

## 2024-01-30 MED ORDER — MAGNESIUM SULFATE 2 GM/50ML IV SOLN
2.0000 g | Freq: Once | INTRAVENOUS | Status: AC
  Administered 2024-01-30: 2 g via INTRAVENOUS
  Filled 2024-01-30: qty 50

## 2024-01-30 MED ORDER — INSULIN ASPART 100 UNIT/ML IJ SOLN
0.0000 [IU] | INTRAMUSCULAR | Status: DC
  Administered 2024-01-30 (×2): 2 [IU] via SUBCUTANEOUS
  Administered 2024-01-30: 1 [IU] via SUBCUTANEOUS
  Administered 2024-01-30: 2 [IU] via SUBCUTANEOUS
  Administered 2024-01-31: 1 [IU] via SUBCUTANEOUS
  Administered 2024-01-31 (×3): 2 [IU] via SUBCUTANEOUS
  Administered 2024-01-31: 1 [IU] via SUBCUTANEOUS
  Administered 2024-01-31: 2 [IU] via SUBCUTANEOUS
  Administered 2024-02-01: 3 [IU] via SUBCUTANEOUS
  Administered 2024-02-01 (×2): 2 [IU] via SUBCUTANEOUS
  Administered 2024-02-01: 3 [IU] via SUBCUTANEOUS
  Administered 2024-02-01 – 2024-02-02 (×3): 2 [IU] via SUBCUTANEOUS
  Administered 2024-02-02: 3 [IU] via SUBCUTANEOUS
  Administered 2024-02-02: 2 [IU] via SUBCUTANEOUS
  Administered 2024-02-02: 3 [IU] via SUBCUTANEOUS
  Administered 2024-02-02 (×2): 2 [IU] via SUBCUTANEOUS
  Administered 2024-02-03: 3 [IU] via SUBCUTANEOUS
  Administered 2024-02-03: 1 [IU] via SUBCUTANEOUS

## 2024-01-30 NOTE — Progress Notes (Signed)
 Patient having ongoing blood in Flexi-Seal with coinciding drop in hemoglobin.  Looks melenic but no bump in hgb.   Getting Cangrelor . Discussed with Cardiology, risky to hold right now with recent intervention but may be able to cut back dose if needed. Will hold off on that for now.    He is hemodynamically stable  Obtaining CT angio. Renal function is normal  Empiric BID IV PPI  Monitor H/H, transfuse if needed  Further reccs to follows.

## 2024-01-30 NOTE — Plan of Care (Signed)
  Problem: Education: Goal: Knowledge of General Education information will improve Description: Including pain rating scale, medication(s)/side effects and non-pharmacologic comfort measures Outcome: Progressing   Problem: Clinical Measurements: Goal: Ability to maintain clinical measurements within normal limits will improve Outcome: Progressing Goal: Will remain free from infection Outcome: Progressing Goal: Diagnostic test results will improve Outcome: Progressing Goal: Respiratory complications will improve Outcome: Progressing Goal: Cardiovascular complication will be avoided Outcome: Progressing   Problem: Activity: Goal: Risk for activity intolerance will decrease Outcome: Progressing   Problem: Nutrition: Goal: Adequate nutrition will be maintained Outcome: Progressing   Problem: Coping: Goal: Level of anxiety will decrease Outcome: Progressing   Problem: Elimination: Goal: Will not experience complications related to urinary retention Outcome: Progressing   Problem: Pain Managment: Goal: General experience of comfort will improve and/or be controlled Outcome: Progressing   Problem: Safety: Goal: Ability to remain free from injury will improve Outcome: Progressing   Problem: Skin Integrity: Goal: Risk for impaired skin integrity will decrease Outcome: Progressing   Problem: Coping: Goal: Ability to adjust to condition or change in health will improve Outcome: Progressing   Problem: Fluid Volume: Goal: Ability to maintain a balanced intake and output will improve Outcome: Progressing   Problem: Metabolic: Goal: Ability to maintain appropriate glucose levels will improve Outcome: Progressing   Problem: Nutritional: Goal: Maintenance of adequate nutrition will improve Outcome: Progressing   Problem: Skin Integrity: Goal: Risk for impaired skin integrity will decrease Outcome: Progressing   Problem: Tissue Perfusion: Goal: Adequacy of tissue  perfusion will improve Outcome: Progressing

## 2024-01-30 NOTE — Progress Notes (Signed)
  Progress Note  Patient Name: Chase Combs Date of Encounter: 01/30/2024 [facility] Cardiologist: Chase Clay, MD   Interval Summary   Overnight has developed bloody stools.  No acute complaints at this time.  No chest pain or shortness of breath.  Vital Signs Vitals:   01/29/24 1939 01/29/24 2305 01/30/24 0434 01/30/24 0449  BP: (!) 159/103 (!) 138/101 (!) 145/76   Pulse: (!) 110 (!) 114 95   Resp: (!) 22 20 19    Temp: 98.6 F (37 C) 98.2 F (36.8 C) 97.9 F (36.6 C)   TempSrc: Axillary Axillary Axillary   SpO2: 97% 95% 96%   Weight:    95.8 kg  Height:        Intake/Output Summary (Last 24 hours) at 01/30/2024 0801 Last data filed at 01/30/2024 0444 Gross per 24 hour  Intake 990.33 ml  Output 400 ml  Net 590.33 ml      01/30/2024    4:49 AM 01/29/2024    2:44 AM 01/28/2024    9:07 AM  Last 3 Weights  Weight (lbs) 211 lb 3.2 oz 210 lb 8.6 oz 200 lb 14.5 oz  Weight (kg) 95.8 kg 95.5 kg 91.13 kg      Telemetry/ECG  Atrial fibrillation- Personally Reviewed  Physical Exam  GEN: No acute distress.   Neck: No JVD Cardiac: Irregular .  Respiratory: Normal work of breathing. GI: Soft, nontender, non-distended  MS: No edema  Assessment & Plan   Coronary artery disease with recent stent Atrial fibrillation New onset systolic heart failure Ischemic colitis  Patient remains on Aggrastat.  Not having chest pain.  Remains in atrial fibrillation on IV amiodarone .  Chase Combs continue these current medications.  He Chase Zapata need anticoagulation once his bloody stools have been diagnosed and have stopped.  Once taking p.o., Demarques Pilz switch to aspirin  and Plavix .   For questions or updates, please contact [facility] Please consult [email or web address] for contact info under       Signed, Chase Kulak Gladis Norton, MD

## 2024-01-30 NOTE — Progress Notes (Addendum)
 Daily Progress Note  DOA: 01/22/2024 Hospital Day: 9  Cc: Diarrhea with blood, ileus (resolving)  ASSESSMENT    78 year old male admitted with severe sepsis , intestinal pneumatosis with possible ischemic versus infectious colitis and ileus .  No surgical intervention needed per surgery . Sepsis resolved but has persistent loose stool and persistent colonic ileus with poor p.o. intake requiring TNA.  C. difficile negative.   Similar presentation following knee surgery in May when he had a prolonged refractory postop ileus with loose stool.  Symptoms eventually improved with metronidazole .  We started metronidazole  twice daily 24 hours ago.   TODAY: Difficult to evaluate for any improvement in diarrhea as patient now passing blood in Flexi-Seal.  WBC normal.  Abdomen still distended but soft and bowel sounds are more normal today.  He is tolerating a lot of clear liquids  Acute GI bleed ( presumably lower) with worsening anemia.  On Cangrelor . Dark red bloody liquid in flexiseal with decline in hgb overnight from 10.4 to 7.7.  Briefly discussed with cardiologist this morning.  Risky to hold cangrelor  at this point but could reduce dose if necessary.  We decided to hold off for the time being.  CT angio obtained and negative for active bleeding.  There is some mild wall thickening of the ascending colon suggesting possible colitis which may or may not be source of bleeding.  Wonder if he has developed ulceration at site of Flexi-Seal balloon since he is having some discomfort in his rectum.   Malnutrition in setting of recent hospitalizations with prolonged ileus Getting TNA. Still on clear for now.  Should be able to advance.  Did not advance diet today due to new onset gastrointestinal bleeding.    CAD / NSTEMI s/p stenting x 2 mid Aug 2025   Sleep apnea   Hypertension    Principal Problem:   Intestinal ischemia (HCC) Active Problems:   CAD S/P percutaneous coronary angioplasty    S/p ST elevation myocardial infarction (STEMI) of inferior wall (September 2010)   Essential hypertension   Hyperlipidemia associated with type 2 diabetes mellitus (HCC)   Sepsis (HCC)   Pneumatosis intestinalis of large intestine   Malnutrition of moderate degree   Ogilvie syndrome   Small intestinal bacterial overgrowth (SIBO)   PLAN   --Continue twice daily metronidazole  for now -- Will discontinue Flexi-Seal -- Okay to reapply an adhesive rectal pouch to collect loose stool if needed -- Continue to monitor H/H -- On the likely this is an upper GI bleed but will empirically start twice daily IV PPI -- If bleeding persists will need lower endoscopy, +/- EGD -- Ileus seems to be resolving but for now given GI bleed would like to hold off on advancing his diet  Subjective   Some nausea but no vomiting.  Tolerating a lot of clear liquids and interested in advancing diet.  Had some discomfort this morning as he felt like he was passing a stool ( even though he has a flexiseal)  Objective   Recent Labs    01/28/24 0400 01/29/24 0339 01/30/24 0830  WBC 6.8 6.3 6.5  HGB 10.4* 10.4* 7.7*  HCT 31.8* 31.9* 23.5*  MCV 88.1 87.9 92.2  PLT 300 285 251   No results for input(s): FOLATE, VITAMINB12, FERRITIN, TIBC, IRONPCTSAT in the last 72 hours. Recent Labs    01/28/24 0400 01/29/24 0339 01/30/24 0535  NA 139 141 137  K 3.9 4.0 3.7  CL 106 108 106  CO2 24 21* 26  GLUCOSE 128* 176* 188*  BUN 31* 19 16  CREATININE 1.07 0.83 0.70  CALCIUM  7.6* 7.5* 7.4*   Recent Labs    01/28/24 0400 01/29/24 0339  PROT 5.0* 4.7*  ALBUMIN 1.8* 1.9*  AST 14* 14*  ALT 18 16  ALKPHOS 58 61  BILITOT 0.6 0.5     Imaging:  CT ANGIO GI BLEED CLINICAL DATA:  Lower gastrointestinal bleeding.  EXAM: CTA ABDOMEN AND PELVIS WITHOUT AND WITH CONTRAST  TECHNIQUE: Multidetector CT imaging of the abdomen and pelvis was performed using the standard protocol during bolus  administration of intravenous contrast. Multiplanar reconstructed images and MIPs were obtained and reviewed to evaluate the vascular anatomy.  RADIATION DOSE REDUCTION: This exam was performed according to the departmental dose-optimization program which includes automated exposure control, adjustment of the mA and/or kV according to patient size and/or use of iterative reconstruction technique.  CONTRAST:  OMNIPAQUE  IOHEXOL  350 MG/ML SOLN  COMPARISON:  January 22, 2024.  FINDINGS: VASCULAR  Aorta: Atherosclerosis of abdominal aorta is noted without aneurysm or dissection.  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 No thrombus is noted.  SMA: Patent without evidence of aneurysm, dissection, vasculitis or significant stenosis.  Renals: Bilateral renal arteries are patent without evidence of aneurysm, dissection, vasculitis, fibromuscular dysplasia or significant stenosis.  IMA: Patent without evidence of aneurysm, dissection, vasculitis or significant stenosis.  Inflow: Patent without evidence of aneurysm, dissection, vasculitis or significant stenosis.  Proximal Outflow: Bilateral common femoral and visualized portions of the superficial and profunda femoral arteries are patent without evidence of aneurysm, dissection, vasculitis or significant stenosis.  Veins: No obvious venous abnormality within the limitations of this arterial phase study.  Review of the MIP images confirms the above findings.  NON-VASCULAR  Lower chest: Minimal bilateral pleural effusions are noted with adjacent subsegmental atelectasis.  Hepatobiliary: No focal liver abnormality is seen. No gallstones, gallbladder wall thickening, or biliary dilatation.  Pancreas: Unremarkable. No pancreatic ductal dilatation or surrounding inflammatory changes.  Spleen: Normal in size without focal abnormality.  Adrenals/Urinary Tract: Probable  right adrenal adenoma. Left adrenal gland is unremarkable. Stable bilateral renal cystic abnormalities as noted on prior exam. No hydronephrosis or renal obstruction is noted. Urinary bladder is unremarkable.  Stomach/Bowel: The stomach is unremarkable. There is no evidence of bowel obstruction. Sigmoid diverticulosis is noted. The appendix is unremarkable. Mild wall and fold thickening of ascending colon is noted suggesting possible infectious or inflammatory colitis. No definite evidence of contrast extravasation to suggest gastrointestinal bleeding. Rectal tube is noted.  Lymphatic: No adenopathy is noted.  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  Other: Small fat containing periumbilical hernia.  No ascites.  Musculoskeletal: No acute or significant osseous findings.  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  No definite evidence of contrast extravasation to suggest gastrointestinal bleeding.  Mild wall and fold thickening of ascending colon is noted suggesting possible infectious or inflammatory colitis.  Aortic Atherosclerosis (ICD10-I70.0).  Electronically Signed   By: Lynwood Landy Raddle M.D.   On: 01/30/2024 14:25     Scheduled inpatient medications:   Chlorhexidine  Gluconate Cloth  6 each Topical Daily   feeding supplement  1 Container Oral TID BM   heparin  injection (subcutaneous)  5,000 Units Subcutaneous Q8H   insulin  aspart  0-9 Units Subcutaneous Q4H   metroNIDAZOLE   500 mg Oral Q12H  pantoprazole  (PROTONIX ) IV  40 mg Intravenous Q12H   sodium chloride  flush  10-40 mL Intracatheter Q12H   thiamine  (VITAMIN B1) injection  100 mg Intravenous Daily   Continuous inpatient infusions:   amiodarone  30 mg/hr (01/30/24 0738)   cangrelor  (KENGREAL ) 50 mg in sodium chloride  0.9 % 250 mL (0.2 mg/mL) infusion 0.75 mcg/kg/min (01/30/24 1210)   TPN ADULT (ION) 50  mL/hr at 01/29/24 1740   TPN ADULT (ION) 80 mL/hr at 01/30/24 1721   PRN inpatient medications: docusate sodium , HYDROmorphone  (DILAUDID ) injection, ondansetron  (ZOFRAN ) IV, polyethylene glycol, sodium chloride  flush  Vital signs in last 24 hours: Temp:  [97.3 F (36.3 C)-98.6 F (37 C)] 97.6 F (36.4 C) (09/07 1619) Pulse Rate:  [92-114] 95 (09/07 1619) Resp:  [19-25] 25 (09/07 1619) BP: (114-159)/(76-116) 151/116 (09/07 1619) SpO2:  [95 %-98 %] 96 % (09/07 1619) Weight:  [95.8 kg] 95.8 kg (09/07 0449) Last BM Date : 01/30/24  Intake/Output Summary (Last 24 hours) at 01/30/2024 1725 Last data filed at 01/30/2024 1300 Gross per 24 hour  Intake 1600.33 ml  Output 675 ml  Net 925.33 ml    Intake/Output from previous day: 09/06 0701 - 09/07 0700 In: 990.3 [P.O.:60; I.V.:930.3] Out: 400 [Urine:400] Intake/Output this shift: Total I/O In: 610 [P.O.:600; I.V.:10] Out: 275 [Urine:275]   Physical Exam:  General: Alert male in NAD Heart:  Regular rate and rhythm.  Pulmonary: Normal respiratory effort Abdomen: Soft, moderately distended with tympany but normal bowel sounds,  nontender.. Extremities: No lower extremity edema  Neurologic: Alert and oriented Psych: Pleasant. Cooperative     LOS: 8 days   Vina Dasen ,NP 01/30/2024, 5:25 PM

## 2024-01-30 NOTE — Progress Notes (Addendum)
 PROGRESS NOTE    Chase Combs  FMW:[id number] DOB: Oct 11, 1945 DOA: 01/22/2024 PCP: Wonda Worth SQUIBB, PA   Brief Narrative:  78 year old male with history of ischemic cardiomyopathy with recent EF of 25%, CAD/recent non-STEMI requiring LAD stent placement and discharged from the hospital on 01/19/2024, hypertension, hyperlipidemia, diabetes mellitus type 2, obesity and paroxysmal presented with nausea, vomiting with CT images concerning for intestinal pneumatosis with ischemic versus infectious colitis.  Patient was in septic shock, admitted to ICU under PCCM service.  He was started on broad-spectrum antibiotics.  Cardiology and general surgery consulted.  Patient was started on TPN.  Subsequently, GI was consulted on 01/28/2024.  Care transferred to TRH service from 01/29/2024 onwards.  Assessment & Plan:   Septic shock: Present on admission: Due to intra-abdominal source; resolved Severe intestinal pneumatosis with ischemic versus infectious colitis/ileus - General Surgery signed off on 01/29/2024.  Recommending conservative management.  X-ray of abdomen on 01/28/2024 showed diffuse gaseous distention of the entire colon.  GI was subsequently consulted. - Off pressors.  Care transferred to TRH service from 01/29/2024 onwards. - Patient has completed 7 days of IV Zosyn ; antibiotic discontinued on 01/28/2024 - Currently on TPN.  Diet advancement as per GI  - Patient had bloody bowel movements on 01/29/2024.  GI is aware.  CBC pending for today.  Possible aspiration pneumonia - Currently on room air.  Completed 1 week course of antibiotics as discussed above.  Nonoliguric AKI due to septic ATN -Creatinine 2.55 on presentation.  Improved with IV fluids.  Creatinine 0.70 this morning.  New onset systolic heart failure/suspected ischemic cardiomyopathy CAD with recent LAD stent - Echo on 01/13/2024 during last hospitalization had shown EF of 25 to 30%.  Strict input and output.  Daily weights.  Fluid  restriction. - Cardiology following.  Currently on Aggrastat.  Not having any chest pain.  Paroxysmal A-fib with RVR - Currently rate controlled.  Currently on amiodarone  drip.  Anticoagulation on hold for now.  Hyponatremia -Resolved  Hypokalemia -Resolved  Acute metabolic acidosis - Improved  Anemia of chronic disease - From chronic illnesses.  Check CBC for today  Goals of care - Overall prognosis is guarded to poor.  Palliative care following.  CODE STATUS has been changed to DNR  DVT prophylaxis: Heparin  subcutaneous Code Status: DNR Family Communication: None at bedside Disposition Plan: Status is: Inpatient Remains inpatient appropriate because: Of severity of illness  Consultants: Cardiology/EP/general surgery/GI.  palliative care  Procedures: None  Antimicrobials:  Anti-infectives (From admission, onward)    Start     Dose/Rate Route Frequency Ordered Stop   01/29/24 2200  metroNIDAZOLE  (FLAGYL ) tablet 500 mg        500 mg Oral Every 12 hours 01/29/24 1627     01/22/24 2200  piperacillin -tazobactam (ZOSYN ) IVPB 3.375 g        3.375 g 12.5 mL/hr over 240 Minutes Intravenous Every 8 hours 01/22/24 1602 01/28/24 2359   01/22/24 1800  metroNIDAZOLE  (FLAGYL ) IVPB 500 mg  Status:  Discontinued        500 mg 100 mL/hr over 60 Minutes Intravenous Every 8 hours 01/22/24 1653 01/23/24 0857   01/22/24 1545  metroNIDAZOLE  (FLAGYL ) IVPB 500 mg  Status:  Discontinued        500 mg 100 mL/hr over 60 Minutes Intravenous  Once 01/22/24 1533 01/22/24 1634   01/22/24 1330  doxycycline (VIBRAMYCIN) 100 mg in sodium chloride  0.9 % 250 mL IVPB  Status:  Discontinued  100 mg 125 mL/hr over 120 Minutes Intravenous  Once 01/22/24 1320 01/22/24 1649   01/22/24 1315  azithromycin (ZITHROMAX) 500 mg in sodium chloride  0.9 % 250 mL IVPB  Status:  Discontinued        500 mg 250 mL/hr over 60 Minutes Intravenous  Once 01/22/24 1309 01/22/24 1319   01/22/24 1315  ceFEPIme   (MAXIPIME ) 2 g in sodium chloride  0.9 % 100 mL IVPB        2 g 200 mL/hr over 30 Minutes Intravenous  Once 01/22/24 1309 01/22/24 1515   01/22/24 1315  vancomycin  (VANCOREADY) IVPB 1750 mg/350 mL  Status:  Discontinued        1,750 mg 175 mL/hr over 120 Minutes Intravenous  Once 01/22/24 1311 01/22/24 1756         Subjective: Patient seen and examined at bedside.  Patient started having bloody bowel movements from yesterday.  No fever, chest pain, worsening shortness breath reported. Objective: Vitals:   01/29/24 1939 01/29/24 2305 01/30/24 0434 01/30/24 0449  BP: (!) 159/103 (!) 138/101 (!) 145/76   Pulse: (!) 110 (!) 114 95   Resp: (!) 22 20 19    Temp: 98.6 F (37 C) 98.2 F (36.8 C) 97.9 F (36.6 C)   TempSrc: Axillary Axillary Axillary   SpO2: 97% 95% 96%   Weight:    95.8 kg  Height:        Intake/Output Summary (Last 24 hours) at 01/30/2024 0808 Last data filed at 01/30/2024 0444 Gross per 24 hour  Intake 990.33 ml  Output 400 ml  Net 590.33 ml   Filed Weights   01/28/24 0907 01/29/24 0244 01/30/24 0449  Weight: 91.1 kg 95.5 kg 95.8 kg    Examination:  General: On room air.  No distress ENT/neck: No thyromegaly.  JVD is not elevated  respiratory: Decreased breath sounds at bases bilaterally with some crackles; no wheezing  CVS: S1-S2 heard, rate controlled currently Abdominal: Soft, nontender, slightly distended; no organomegaly, bowel sounds are sluggish.  Rectal tube present with dark bloody drainage extremities: Mild lower extremity edema; no cyanosis  CNS: Awake and alert.  Still slow to respond.  No focal neurologic deficit.  Moves extremities Lymph: No obvious lymphadenopathy Skin: No obvious ecchymosis/lesions  psych: Flat affect.  Not agitated.   musculoskeletal: No obvious joint swelling/deformity     Data Reviewed: I have personally reviewed following labs and imaging studies  CBC: Recent Labs  Lab 01/25/24 0752 01/26/24 0439 01/27/24 0539  01/28/24 0400 01/29/24 0339  WBC 4.7 5.5 6.2 6.8 6.3  HGB 10.7* 10.7* 10.2* 10.4* 10.4*  HCT 33.7* 33.2* 31.1* 31.8* 31.9*  MCV 89.4 88.5 87.6 88.1 87.9  PLT 297 299 298 300 285   Basic Metabolic Panel: Recent Labs  Lab 01/23/24 1111 01/24/24 0755 01/26/24 0439 01/26/24 1832 01/27/24 0539 01/28/24 0400 01/29/24 0339 01/30/24 0535  NA 133*   < > 134*  --  140 139 141 137  K 3.0*   < > 2.7* 3.7 3.3* 3.9 4.0 3.7  CL 96*   < > 102  --  103 106 108 106  CO2 26   < > 22  --  21* 24 21* 26  GLUCOSE 106*   < > 92  --  164* 128* 176* 188*  BUN 58*   < > 68*  --  47* 31* 19 16  CREATININE 1.50*   < > 1.99*  --  1.46* 1.07 0.83 0.70  CALCIUM  7.6*   < >  7.6*  --  7.5* 7.6* 7.5* 7.4*  MG 2.0  --  3.0*  --   --  2.4 2.2 1.9  PHOS  --   --  3.3  --   --  1.9* 3.4 2.9   < > = values in this interval not displayed.   GFR: Estimated Creatinine Clearance: 94.3 mL/min (by C-G formula based on SCr of 0.7 mg/dL). Liver Function Tests: Recent Labs  Lab 01/25/24 0752 01/26/24 0439 01/27/24 0539 01/28/24 0400 01/29/24 0339  AST 17 14* 14* 14* 14*  ALT 24 21 20 18 16   ALKPHOS 55 52 59 58 61  BILITOT 0.8 0.9 0.9 0.6 0.5  PROT 4.3* 4.4* 5.4* 5.0* 4.7*  ALBUMIN 1.7* 1.7* 1.8* 1.8* 1.9*   No results for input(s): LIPASE, AMYLASE in the last 168 hours. No results for input(s): AMMONIA in the last 168 hours. Coagulation Profile: No results for input(s): INR, PROTIME in the last 168 hours. Cardiac Enzymes: No results for input(s): CKTOTAL, CKMB, CKMBINDEX, TROPONINI in the last 168 hours. BNP (last 3 results) Recent Labs    01/07/24 2301  PROBNP 15,245.0*   HbA1C: No results for input(s): HGBA1C in the last 72 hours. CBG: Recent Labs  Lab 01/29/24 1602 01/29/24 1959 01/29/24 2328 01/30/24 0409 01/30/24 0737  GLUCAP 147* 186* 176* 179* 181*   Lipid Profile: Recent Labs    01/28/24 0400  TRIG 98   Thyroid  Function Tests: No results for input(s): TSH,  T4TOTAL, FREET4, T3FREE, THYROIDAB in the last 72 hours. Anemia Panel: No results for input(s): VITAMINB12, FOLATE, FERRITIN, TIBC, IRON, RETICCTPCT in the last 72 hours. Sepsis Labs: Recent Labs  Lab 01/24/24 1002 01/24/24 1857  LATICACIDVEN 1.1 1.1    Recent Results (from the past 240 hours)  Culture, blood (routine x 2)     Status: None   Collection Time: 01/22/24 12:43 PM   Specimen: BLOOD RIGHT ARM  Result Value Ref Range Status   Specimen Description BLOOD RIGHT ARM  Final   Special Requests   Final    BOTTLES DRAWN AEROBIC AND ANAEROBIC Blood Culture adequate volume   Culture   Final    NO GROWTH 5 DAYS Performed at [facility], [address], [place], [place] [address]    Report Status 01/27/2024 FINAL  Final  Culture, blood (routine x 2)     Status: None   Collection Time: 01/22/24 12:48 PM   Specimen: BLOOD RIGHT HAND  Result Value Ref Range Status   Specimen Description BLOOD RIGHT HAND  Final   Special Requests   Final    BOTTLES DRAWN AEROBIC ONLY Blood Culture results may not be optimal due to an inadequate volume of blood received in culture bottles   Culture   Final    NO GROWTH 5 DAYS Performed at [facility], [address], [place], [place] [address]    Report Status 01/27/2024 FINAL  Final  C Difficile Quick Screen w PCR reflex     Status: None   Collection Time: 01/22/24  1:10 PM   Specimen: STOOL  Result Value Ref Range Status   C Diff antigen NEGATIVE NEGATIVE Final   C Diff toxin NEGATIVE NEGATIVE Final   C Diff interpretation No C. difficile detected.  Final    Comment: Performed at [facility], [address], [place], [place] [address]  MRSA Next Gen by PCR, Nasal     Status: None   Collection Time: 01/22/24  1:10 PM   Specimen: Nasal Mucosa; Nasal Swab  Result Value Ref Range Status   MRSA by PCR Next Gen NOT DETECTED NOT DETECTED Final    Comment: (NOTE) The GeneXpert MRSA Assay (FDA approved for  NASAL specimens only), is one component of a comprehensive MRSA colonization surveillance program. It is not intended to diagnose MRSA infection nor to guide or monitor treatment for MRSA infections. Test performance is not FDA approved in patients less than 68 years old. Performed at [facility], [address], [place], [place] [address]          Radiology Studies: DG Abd 1 View Result Date: 01/28/2024 CLINICAL DATA:  [id number] Ileus [facility]) [phone] EXAM: ABDOMEN - 1 VIEW COMPARISON:  January 25, 2024, January 22, 2024 FINDINGS: The stomach is not evaluated.Similar diffuse gaseous distension of the entire colon with minimal rectal gas present. Generalized paucity of small bowel gas throughout the central abdomen.No pneumoperitoneum. Multilevel degenerative disc disease of the spine. IMPRESSION: Similar diffuse gaseous distension of the entire colon. Electronically Signed   By: Rogelia Myers M.D.   On: 01/28/2024 10:01        Scheduled Meds:  Chlorhexidine  Gluconate Cloth  6 each Topical Daily   feeding supplement  1 Container Oral TID BM   heparin  injection (subcutaneous)  5,000 Units Subcutaneous Q8H   insulin  aspart  0-9 Units Subcutaneous Q4H   metroNIDAZOLE   500 mg Oral Q12H   sodium chloride  flush  10-40 mL Intracatheter Q12H   thiamine  (VITAMIN B1) injection  100 mg Intravenous Daily   Continuous Infusions:  amiodarone  30 mg/hr (01/30/24 0738)   cangrelor  (KENGREAL ) 50 mg in sodium chloride  0.9 % 250 mL (0.2 mg/mL) infusion 0.75 mcg/kg/min (01/29/24 2303)   magnesium  sulfate bolus IVPB     potassium chloride      TPN ADULT (ION) 50 mL/hr at 01/29/24 1740   TPN ADULT (ION)            Sophie Mao, MD [facility] 01/30/2024, 8:08 AM

## 2024-01-30 NOTE — Progress Notes (Signed)
 PHARMACY - TOTAL PARENTERAL NUTRITION CONSULT NOTE  Indication: Prolonged ileus  Patient Measurements: Height: 6' (182.9 cm) Weight: 95.8 kg (211 lb 3.2 oz) IBW/kg (Calculated) : 77.6 TPN AdjBW (KG): 91.1 Body mass index is 28.64 kg/m.  Assessment:  9 YOM presented on 8/30 with abdominal pain, found to have adynamic ileus and possible ischemic colitis.  Patient stated his last meal was on 8/30 and was likely losing weight PTA, but unable to quantify.  He was started on a clear liquid diet on 9/3 PM and hasn't been able to advance.  Pharmacy consulted to manage TPN.  He is at risk for refeeding syndrome.  Glucose / Insulin : no hx DM - CBGs variable (120-200s) Used 1 units SSI charted in the past 24 hrs Electrolytes: K 3.7 (goal >/= 4, has required a significant amount of KCL runs since admission, up to 20 runs on day one), Mag 1.9, Phos 2.9 , CoCa 9.1 Renal: SCr 0.7, BUN 16 Hepatic: LFTs / tbili / TG WNL, albumin 1.9 Intake / Output; MIVF: UOP , stool 0mL charted GI Imaging: 9/5 KUB: diffuse gaseous distension of entire colon  GI Surgeries / Procedures: none since TPN initiation   Central access: PICC placed 01/24/24 TPN start date: 01/28/24  Nutritional Goals: Goal TPN rate is 80 mL/hr (provides 100g AA and 1965 kCal per day)  RD Estimated Needs Total Energy Estimated Needs: 1900-2100 kcals Total Protein Estimated Needs: 95-110g Total Fluid Estimated Needs: 1.9-2.1L/day  Current Nutrition:  TPN CLD started 9/3  Plan:  Advance TPN to 80 mL/hr at 1800 (goal rate 80 ml/hr) to provide 100% of estimated needs. Electrolytes in TPN: Na 20 mEq/L, K 35 mEq/L, Ca 4 mEq/L, Mg 6 mEq/L, Phos 10 mmol/L, Cl:Ac 1:2  Continue standard MVI and trace elements to TPN Initiate SSI sensitive Q4H  Thiamine  100mg  IV daily x 5 (last dose 9/9) Monitor TPN labs on Mon/Thurs  KCL 10meq IV x2 and Mag 2g IV x1 outside of TPN this AM  Sharyne Glatter, PharmD, BCCCP Critical Care Clinical  Pharmacist 01/30/2024 7:03 AM

## 2024-01-30 NOTE — Progress Notes (Signed)
 Daily Progress Note   Patient Name: Chase Combs       Date: 01/30/2024 DOB: 01-Aug-1945  Age: 78 y.o. MRN#: [id number] Attending Physician: Cheryle Page, MD Primary Care Physician: Chase Worth SQUIBB, PA Admit Date: 01/22/2024  Reason for Consultation/Follow-up: Establishing goals of care  Length of Stay: 8  Current Medications: Scheduled Meds:   Chlorhexidine  Gluconate Cloth  6 each Topical Daily   feeding supplement  1 Container Oral TID BM   heparin  injection (subcutaneous)  5,000 Units Subcutaneous Q8H   insulin  aspart  0-9 Units Subcutaneous Q4H   metroNIDAZOLE   500 mg Oral Q12H   pantoprazole  (PROTONIX ) IV  40 mg Intravenous Q12H   sodium chloride  flush  10-40 mL Intracatheter Q12H   thiamine  (VITAMIN B1) injection  100 mg Intravenous Daily    Continuous Infusions:  amiodarone  30 mg/hr (01/30/24 0738)   cangrelor  (KENGREAL ) 50 mg in sodium chloride  0.9 % 250 mL (0.2 mg/mL) infusion 0.75 mcg/kg/min (01/30/24 1210)   TPN ADULT (ION) 50 mL/hr at 01/29/24 1740   TPN ADULT (ION)      PRN Meds: docusate sodium , HYDROmorphone  (DILAUDID ) injection, ondansetron  (ZOFRAN ) IV, polyethylene glycol, sodium chloride  flush  Physical Exam Vitals reviewed.  Constitutional:      General: He is not in acute distress.    Appearance: He is ill-appearing.  HENT:     Head: Normocephalic and atraumatic.  Cardiovascular:     Rate and Rhythm: Normal rate.  Pulmonary:     Effort: Tachypnea present.  Skin:    General: Skin is warm and dry.  Neurological:     Mental Status: He is alert and oriented to person, place, and time.             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  Sepsis (HCC) 01/23/2024   Pneumatosis intestinalis of large intestine 01/23/2024   Intestinal ischemia (HCC) 01/22/2024   Acute on chronic systolic CHF (congestive heart failure) (HCC) 01/17/2024   Ischemic cardiomyopathy 01/14/2024   Multifocal pneumonia 01/08/2024   Acute hypoxemic respiratory failure (HCC) 01/08/2024   Acute exacerbation of CHF (congestive heart failure) (HCC) 01/08/2024   Chest pain 01/08/2024   Transaminitis 10/21/2023   Acute diarrhea 10/19/2023   Abnormal finding on GI tract imaging 10/19/2023   Ileus, postoperative (HCC) 10/16/2023   Fever, unspecified 10/16/2023   Leukocytosis 10/16/2023   Hypokalemia 10/16/2023   Hypoxia 10/10/2023   Status post total knee replacement, 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   DOE (dyspnea on exertion) 06/21/2023   Atypical angina (HCC) 06/21/2023   Episode of dizziness 06/13/2022   Preop cardiovascular exam 07/03/2021   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(HCC) 06/17/2021   Hyperglycemia, unspecified 02/15/2020   Fatigue due to treatment 09/24/2017   Polypharmacy 06/10/2014   Hyperlipidemia associated with type 2 diabetes mellitus (HCC) 03/03/2013   Insomnia 03/03/2013   Essential hypertension    S/p ST elevation myocardial infarction (STEMI) of inferior wall (September 2010) 02/16/2009   CAD S/P percutaneous coronary angioplasty 06/18/2004    Palliative Care Assessment & Plan   Patient Profile: 78 y.o. male  with past medical history of CAD/recent non-STEMI requiring LAD stent placement and discharged from the hospital on 01/19/2024, ischemic cardiomyopathy, recent decrease in EF from 40% to 25% by recent echocardiogram on 01/13/2024  compared to 07/12/2023, new onset A-fib with RVR, hypertension, hypercholesterolemia, diabetes mellitus, obesity admitted on 01/22/2024 with nausea and vomiting.    CT images concerning for intestinal pneumatosis with ischemic versus infectious colitis.  Patient was in septic shock, admitted to ICU under PCCM service.  He was started on broad-spectrum antibiotics.  Cardiology and general surgery consulted.  Patient was started on TPN. GI seeing. 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   Today's Discussion: Chart reviewed and updates received from nursing. Patient had blood in his stools overnight. Patient sitting up in bed with family at bedside (son, daughter-in-law, and grandsons). Patient shares he is getting ready to go for a CT scan and his wife has not arrived. He asked me to check back in a little while so I can talk with his wife Chase Combs.  Called patient's wife Chase Combs. We met in the patient's room with another family member to discuss/review yesterday's discussion. We discussed code status and the patient's change to DNR yesterday. Discussed the evidenced-based poor outcomes in similar hospitalized patients, as the cause of the arrest is likely associated with chronic/terminal disease rather than a reversible acute cardio-pulmonary event. Family shared the patient had previously indicated he would not want to be on life support. Encouraged family to further explore this with patient since he shared yesterday he would want full scope of care. I encouraged them to see what his limitations on life support might be.   Discussed the option of having outpatient palliative care follow the patient. Discussed the likelihood of increased exacerbations and symptoms as heart failure progresses. Family will reach out to PMT if they want this service after discharge.  Discussed the importance of continued conversation with family and the medical providers regarding overall plan of care and  treatment options, ensuring decisions are within the context of the patient's values and GOCs.   Questions and concerns were addressed. Hard Choices booklet left for review. The family was encouraged to call with questions or concerns. PMT will continue to  support holistically as needed.   Recommendations/Plan: DNR Full scope of care PMT support as needed- Please reach out to PMT with needs.  Code Status:    Code Status Orders  (From admission, onward)           Start     Ordered   01/29/24 1554  Do not attempt resuscitation (DNR) Pre-Arrest Interventions Desired  (Code Status)  Continuous       Question Answer Comment  If pulseless and not breathing No CPR or chest compressions.   In Pre-Arrest Conditions (Patient Has Pulse and Is Breathing) May intubate, use advanced airway interventions and cardioversion/ACLS medications if appropriate or indicated. May transfer to ICU.   Consent: Discussion documented in EHR or advanced directives reviewed      01/29/24 1553         Extensive chart review has been completed prior to seeing the patient including labs, vital signs, imaging, progress/consult notes, orders, medications, and available advance directive documents.  Care plan was discussed with bedside RN  Time spent: 50 minutes  Thank you for allowing the Palliative Medicine Team to assist in the care of this patient.   Stephane CHRISTELLA Palin, NP  Please contact Palliative Medicine Team phone at [phone] for questions and concerns.

## 2024-01-31 DIAGNOSIS — K559 Vascular disorder of intestine, unspecified: Secondary | ICD-10-CM | POA: Diagnosis not present

## 2024-01-31 DIAGNOSIS — I5020 Unspecified systolic (congestive) heart failure: Secondary | ICD-10-CM | POA: Diagnosis not present

## 2024-01-31 DIAGNOSIS — K625 Hemorrhage of anus and rectum: Secondary | ICD-10-CM | POA: Insufficient documentation

## 2024-01-31 DIAGNOSIS — K567 Ileus, unspecified: Secondary | ICD-10-CM | POA: Insufficient documentation

## 2024-01-31 DIAGNOSIS — D62 Acute posthemorrhagic anemia: Secondary | ICD-10-CM

## 2024-01-31 DIAGNOSIS — I4819 Other persistent atrial fibrillation: Secondary | ICD-10-CM | POA: Diagnosis not present

## 2024-01-31 DIAGNOSIS — K2970 Gastritis, unspecified, without bleeding: Secondary | ICD-10-CM | POA: Diagnosis not present

## 2024-01-31 DIAGNOSIS — I2510 Atherosclerotic heart disease of native coronary artery without angina pectoris: Secondary | ICD-10-CM | POA: Diagnosis not present

## 2024-01-31 LAB — CBC WITH DIFFERENTIAL/PLATELET
Abs Immature Granulocytes: 0.16 K/uL — ABNORMAL HIGH (ref 0.00–0.07)
Basophils Absolute: 0 K/uL (ref 0.0–0.1)
Basophils Relative: 0 %
Eosinophils Absolute: 0.2 K/uL (ref 0.0–0.5)
Eosinophils Relative: 3 %
HCT: 23.3 % — ABNORMAL LOW (ref 39.0–52.0)
Hemoglobin: 7.5 g/dL — ABNORMAL LOW (ref 13.0–17.0)
Immature Granulocytes: 2 %
Lymphocytes Relative: 10 %
Lymphs Abs: 0.7 K/uL (ref 0.7–4.0)
MCH: 28.7 pg (ref 26.0–34.0)
MCHC: 32.2 g/dL (ref 30.0–36.0)
MCV: 89.3 fL (ref 80.0–100.0)
Monocytes Absolute: 0.4 K/uL (ref 0.1–1.0)
Monocytes Relative: 5 %
Neutro Abs: 5.5 K/uL (ref 1.7–7.7)
Neutrophils Relative %: 80 %
Platelets: 246 K/uL (ref 150–400)
RBC: 2.61 MIL/uL — ABNORMAL LOW (ref 4.22–5.81)
RDW: 17.5 % — ABNORMAL HIGH (ref 11.5–15.5)
WBC: 6.9 K/uL (ref 4.0–10.5)
nRBC: 0 % (ref 0.0–0.2)

## 2024-01-31 LAB — GLUCOSE, CAPILLARY
Glucose-Capillary: 131 mg/dL — ABNORMAL HIGH (ref 70–99)
Glucose-Capillary: 148 mg/dL — ABNORMAL HIGH (ref 70–99)
Glucose-Capillary: 160 mg/dL — ABNORMAL HIGH (ref 70–99)
Glucose-Capillary: 162 mg/dL — ABNORMAL HIGH (ref 70–99)
Glucose-Capillary: 176 mg/dL — ABNORMAL HIGH (ref 70–99)
Glucose-Capillary: 200 mg/dL — ABNORMAL HIGH (ref 70–99)

## 2024-01-31 LAB — COMPREHENSIVE METABOLIC PANEL WITH GFR
ALT: 14 U/L (ref 0–44)
AST: 13 U/L — ABNORMAL LOW (ref 15–41)
Albumin: 1.8 g/dL — ABNORMAL LOW (ref 3.5–5.0)
Alkaline Phosphatase: 48 U/L (ref 38–126)
Anion gap: 4 — ABNORMAL LOW (ref 5–15)
BUN: 12 mg/dL (ref 8–23)
CO2: 25 mmol/L (ref 22–32)
Calcium: 7.6 mg/dL — ABNORMAL LOW (ref 8.9–10.3)
Chloride: 107 mmol/L (ref 98–111)
Creatinine, Ser: 0.61 mg/dL (ref 0.61–1.24)
GFR, Estimated: >60 mL/min (ref >60)
Glucose, Bld: 177 mg/dL — ABNORMAL HIGH (ref 70–99)
Potassium: 3.8 mmol/L (ref 3.5–5.1)
Sodium: 136 mmol/L (ref 135–145)
Total Bilirubin: 0.3 mg/dL (ref 0.0–1.2)
Total Protein: 4.3 g/dL — ABNORMAL LOW (ref 6.5–8.1)

## 2024-01-31 LAB — HEMOGLOBIN AND HEMATOCRIT, BLOOD
HCT: 24.1 % — ABNORMAL LOW (ref 39.0–52.0)
Hemoglobin: 7.8 g/dL — ABNORMAL LOW (ref 13.0–17.0)

## 2024-01-31 LAB — TRIGLYCERIDES: Triglycerides: 101 mg/dL (ref <150)

## 2024-01-31 LAB — PREPARE RBC (CROSSMATCH)

## 2024-01-31 LAB — PHOSPHORUS: Phosphorus: 2.8 mg/dL (ref 2.5–4.6)

## 2024-01-31 LAB — ABO/RH: ABO/RH(D): O POS

## 2024-01-31 LAB — MAGNESIUM: Magnesium: 1.8 mg/dL (ref 1.7–2.4)

## 2024-01-31 MED ORDER — TRAVASOL 10 % IV SOLN
INTRAVENOUS | Status: AC
  Filled 2024-01-31: qty 998.4

## 2024-01-31 MED ORDER — METOPROLOL SUCCINATE ER 25 MG PO TB24
12.5000 mg | ORAL_TABLET | Freq: Every day | ORAL | Status: DC
  Administered 2024-01-31 – 2024-02-01 (×2): 12.5 mg via ORAL
  Filled 2024-01-31 (×2): qty 1

## 2024-01-31 MED ORDER — GERHARDT'S BUTT CREAM
TOPICAL_CREAM | Freq: Two times a day (BID) | CUTANEOUS | Status: DC
  Administered 2024-02-03 – 2024-02-11 (×2): 1 via TOPICAL
  Filled 2024-01-31 (×7): qty 60

## 2024-01-31 MED ORDER — MAGNESIUM SULFATE 2 GM/50ML IV SOLN
2.0000 g | Freq: Once | INTRAVENOUS | Status: AC
  Administered 2024-01-31: 2 g via INTRAVENOUS
  Filled 2024-01-31: qty 50

## 2024-01-31 MED ORDER — POTASSIUM CHLORIDE 10 MEQ/50ML IV SOLN
10.0000 meq | INTRAVENOUS | Status: AC
  Administered 2024-01-31 (×2): 10 meq via INTRAVENOUS
  Filled 2024-01-31 (×2): qty 50

## 2024-01-31 MED ORDER — MELATONIN 5 MG PO TABS
5.0000 mg | ORAL_TABLET | Freq: Every evening | ORAL | Status: DC | PRN
Start: 2024-01-31 — End: 2024-02-03
  Administered 2024-02-01: 5 mg via ORAL
  Filled 2024-01-31: qty 1

## 2024-01-31 MED ORDER — CHLORHEXIDINE GLUCONATE CLOTH 2 % EX PADS
6.0000 | MEDICATED_PAD | Freq: Every day | CUTANEOUS | Status: DC
  Administered 2024-02-01 – 2024-02-09 (×8): 6 via TOPICAL

## 2024-01-31 MED ORDER — SODIUM CHLORIDE 0.9% IV SOLUTION: Freq: Once | INTRAVENOUS | Status: AC

## 2024-01-31 MED ORDER — FUROSEMIDE 10 MG/ML IJ SOLN
40.0000 mg | Freq: Once | INTRAMUSCULAR | Status: AC
  Administered 2024-01-31: 40 mg via INTRAVENOUS
  Filled 2024-01-31: qty 4

## 2024-01-31 MED ORDER — SPIRONOLACTONE 12.5 MG HALF TABLET
12.5000 mg | ORAL_TABLET | Freq: Every day | ORAL | Status: DC
Start: 2024-01-31 — End: 2024-02-01
  Administered 2024-01-31 – 2024-02-01 (×2): 12.5 mg via ORAL
  Filled 2024-01-31 (×2): qty 1

## 2024-01-31 NOTE — Progress Notes (Addendum)
 [facility] ROUNDING NOTE   Subjective:  No further bleeding, no abdominal pain He is tolerating full liquid diet Hemoglobin is overall stable   Objective: Vital signs in last 24 hours: Temp:  [97.5 F (36.4 C)-98.7 F (37.1 C)] 97.5 F (36.4 C) (09/08 1448) Pulse Rate:  [79-113] 95 (09/08 1448) Resp:  [18-25] 20 (09/08 1448) BP: (103-151)/(72-116) 117/79 (09/08 1448) SpO2:  [95 %-98 %] 98 % (09/08 1448) Weight:  [96.8 kg] 96.8 kg (09/08 0401) Last BM Date : 01/30/24 General: NAD Abdomen: No distention, no tenderness, soft, normal bowel sounds     Intake/Output from previous day: 09/07 0701 - 09/08 0700 In: 2670.2 [P.O.:600; I.V.:2070.2] Out: 2225 [Urine:1225; Stool:1000] Intake/Output this shift: No intake/output data recorded.   Lab Results: Recent Labs    01/30/24 0830 01/30/24 1215 01/30/24 2027 01/31/24 0530  WBC 6.5 7.3  --  6.9  HGB 7.7* 7.8* 8.0* 7.5*  PLT 251 257  --  246  MCV 92.2 91.8  --  89.3   BMET Recent Labs    01/29/24 0339 01/30/24 0535 01/31/24 0530  NA 141 137 136  K 4.0 3.7 3.8  CL 108 106 107  CO2 21* 26 25  GLUCOSE 176* 188* 177*  BUN 19 16 12   CREATININE 0.83 0.70 0.61  CALCIUM  7.5* 7.4* 7.6*   LFT Recent Labs    01/29/24 0339 01/31/24 0530  PROT 4.7* 4.3*  ALBUMIN 1.9* 1.8*  AST 14* 13*  ALT 16 14  ALKPHOS 61 48  BILITOT 0.5 0.3   PT/INR No results for input(s): INR in the last 72 hours.    Imaging/Other results: CT ANGIO GI BLEED Result Date: 01/30/2024 CLINICAL DATA:  Lower gastrointestinal bleeding. EXAM: CTA ABDOMEN AND PELVIS WITHOUT AND WITH CONTRAST TECHNIQUE: Multidetector CT imaging of the abdomen and pelvis was performed using the standard protocol during bolus administration of intravenous contrast. Multiplanar reconstructed images and MIPs were obtained and reviewed to evaluate the vascular anatomy. RADIATION DOSE REDUCTION: This exam was performed according to the departmental  dose-optimization program which includes automated exposure control, adjustment of the mA and/or kV according to patient size and/or use of iterative reconstruction technique. CONTRAST:  OMNIPAQUE  IOHEXOL  350 MG/ML SOLN COMPARISON:  January 22, 2024. FINDINGS: VASCULAR Aorta: Atherosclerosis of abdominal aorta is noted without aneurysm or dissection. 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 No thrombus is noted. SMA: Patent without evidence of aneurysm, dissection, vasculitis or significant stenosis. Renals: Bilateral renal arteries are patent without evidence of aneurysm, dissection, vasculitis, fibromuscular dysplasia or significant stenosis. IMA: Patent without evidence of aneurysm, dissection, vasculitis or significant stenosis. Inflow: Patent without evidence of aneurysm, dissection, vasculitis or significant stenosis. Proximal Outflow: Bilateral common femoral and visualized portions of the superficial and profunda femoral arteries are patent without evidence of aneurysm, dissection, vasculitis or significant stenosis. Veins: No obvious venous abnormality within the limitations of this arterial phase study. Review of the MIP images confirms the above findings. NON-VASCULAR Lower chest: Minimal bilateral pleural effusions are noted with adjacent subsegmental atelectasis. Hepatobiliary: No focal liver abnormality is seen. No gallstones, gallbladder wall thickening, or biliary dilatation. Pancreas: Unremarkable. No pancreatic ductal dilatation or surrounding inflammatory changes. Spleen: Normal in size without focal abnormality. Adrenals/Urinary Tract: Probable right adrenal adenoma. Left adrenal gland is unremarkable. Stable bilateral renal cystic abnormalities as noted on prior exam. No hydronephrosis or renal obstruction is noted. Urinary bladder is unremarkable. Stomach/Bowel: The stomach is unremarkable. There is no  evidence of bowel obstruction. Sigmoid  diverticulosis is noted. The appendix is unremarkable. Mild wall and fold thickening of ascending colon is noted suggesting possible infectious or inflammatory colitis. No definite evidence of contrast extravasation to suggest gastrointestinal bleeding. Rectal tube is noted. Lymphatic: No adenopathy is noted. 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 Other: Small fat containing periumbilical hernia.  No ascites. Musculoskeletal: No acute or significant osseous findings. 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 No definite evidence of contrast extravasation to suggest gastrointestinal bleeding. Mild wall and fold thickening of ascending colon is noted suggesting possible infectious or inflammatory colitis. Aortic Atherosclerosis (ICD10-I70.0). Electronically Signed   By: Lynwood Landy Raddle M.D.   On: 01/30/2024 14:25      Assessment &Plan  78 year old male admitted with sepsis, intestinal pneumatosis, possible ischemic colitis and ileus, rectal bleeding likely secondary to ischemic colitis and also possible stercoral ulcer secondary to rectal tube, is resolving Hemoglobin is stable, no hemodynamic instability Okay to restart Plavix  tomorrow if hemoglobin continues to remain stable with no evidence of ongoing GI bleed Advance diet to low residue soft diet as tolerated If no further bleeding, can restart Eliquis later this week or next week  GI signing off, available if have any questions   This visit required >35 minutes of patient care (this includes precharting, chart review, review of results, face-to-face time used for counseling as well as treatment plan and follow-up. The patient was provided an opportunity to ask questions and all were answered. The patient agreed with the plan and demonstrated an understanding of the instructions.    LOIS Wilkie Mcgee , MD [phone]  [facility]  Gastroenterology

## 2024-01-31 NOTE — Progress Notes (Signed)
 PHARMACY - TOTAL PARENTERAL NUTRITION CONSULT NOTE  Indication: Prolonged ileus  Patient Measurements: Height: 6' (182.9 cm) Weight: 96.8 kg (213 lb 6.5 oz) IBW/kg (Calculated) : 77.6 TPN AdjBW (KG): 91.1 Body mass index is 28.94 kg/m.  Assessment:  15 YOM presented on 8/30 with abdominal pain, found to have adynamic ileus and possible ischemic colitis.  Patient stated his last meal was on 8/30 and was likely losing weight PTA, but unable to quantify.  He was started on a clear liquid diet on 9/3 PM and hasn't been able to advance.  Pharmacy consulted to manage TPN.  He is at risk for refeeding syndrome.  Glucose / Insulin : no hx DM - CBGs variable (131-200s) Used 3 units SSI charted in the past 24 hrs Electrolytes: Na 136, K 3.8 (goal >/= 4, has required a significant amount of KCL runs since admission, up to 20 runs on day one), Cl 107, Mag 1.8, Phos 2.8 , Ca 7.6 [CoCa 9.36] Renal: SCr 0.61, BUN 12 Hepatic: AST 13 / ALT, Alk Phos, Tbili wnl, TG 101, albumin 1.8 Intake / Output; MIVF: UOP 0.53 mL/kg/hr + 1 unmeasured, stool 1000 mL GI Imaging: 9/5 KUB: diffuse gaseous distension of entire colon  GI Surgeries / Procedures: none since TPN initiation   Central access: PICC placed 01/24/24 TPN start date: 01/28/24  Nutritional Goals: Goal TPN rate is 80 mL/hr (provides 100g AA and 1965 kCal per day)  RD Estimated Needs Total Energy Estimated Needs: 1900-2100 kcals Total Protein Estimated Needs: 95-110g Total Fluid Estimated Needs: 1.9-2.1L/day  Current Nutrition:  TPN CLD started 9/3  Plan:  Continue TPN to 80 mL/hr at 1800 (goal rate 80 ml/hr) to provide 100% of estimated needs. Electrolytes in TPN: Na 25 mEq/L, K 40 mEq/L, Ca 4 mEq/L, Mg 8 mEq/L, Phos 15 mmol/L, Cl:Ac 1:2  Continue standard MVI and trace elements to TPN Continue SSI sensitive Q4H  Thiamine  100mg  IV daily x 5 (last dose 9/9) Monitor TPN labs on Mon/Thurs; PRN KCL 10meq IV x2 and Mag 2g IV x1 outside of TPN  this AM F/up on advancing diet and discontinuing TPN when clinically appropriate    Benedetta Heath BS, PharmD, BCPS Clinical Pharmacist 01/31/2024 7:05 AM  Contact: [phone] after 3 PM

## 2024-01-31 NOTE — Progress Notes (Signed)
 PROGRESS NOTE    Chase Combs  FMW:[id number] DOB: [birth year] DOA: 01/22/2024 PCP: Wonda Worth SQUIBB, PA   Brief Narrative:  78 year old male with history of ischemic cardiomyopathy with recent EF of 25%, CAD/recent non-STEMI requiring LAD stent placement and discharged from the hospital on 01/19/2024, hypertension, hyperlipidemia, diabetes mellitus type 2, obesity and paroxysmal presented with nausea, vomiting with CT images concerning for intestinal pneumatosis with ischemic versus infectious colitis.  Patient was in septic shock, admitted to ICU under PCCM service.  He was started on broad-spectrum antibiotics.  Cardiology and general surgery consulted.  Patient was started on TPN.  Subsequently, GI was consulted on 01/28/2024.  Care transferred to [facility] service from 01/29/2024 onwards.  Assessment & Plan:   Septic shock: Present on admission: Due to intra-abdominal source; resolved Severe intestinal pneumatosis with ischemic versus infectious colitis/ileus - General Surgery signed off on 01/29/2024.  Recommending conservative management.  X-ray of abdomen on 01/28/2024 showed diffuse gaseous distention of the entire colon.  GI was subsequently consulted. - Off pressors.  Care transferred to [facility] service from 01/29/2024 onwards. - Patient has completed 7 days of IV Zosyn ; antibiotic discontinued on 01/28/2024 - Currently on TPN.  Diet advancement as per GI  - Patient had bloody bowel movements in his rectal tube since 01/29/2024.  Rectal tube discontinued on 01/30/2024 as per GI recommendations.  Monitor H&H.  Hemoglobin 7.5 this morning.  Subcutaneous heparin  has been discontinued.  Possible aspiration pneumonia - Currently on room air.  Completed 1 week course of antibiotics as discussed above.  Nonoliguric AKI due to septic ATN -Creatinine 2.55 on presentation.  Improved with IV fluids.   New onset systolic heart failure/suspected ischemic cardiomyopathy CAD with recent LAD stent - Echo on 01/13/2024  during last hospitalization had shown EF of 25 to 30%.  Strict input and output.  Daily weights.  Fluid restriction. - Cardiology following.  Currently on cangrelor .  Not having any chest pain.  Paroxysmal A-fib with RVR - Currently rate controlled.  Currently on amiodarone  drip.  Anticoagulation on hold for now.  Hyponatremia -Resolved  Hypokalemia -Resolved  Acute metabolic acidosis - Improved  Acute blood loss anemia on top of anemia of chronic disease - Due to rectal bleeding.  Monitor H&H.  Follow GI recommendations.  Hypoalbuminemia Moderate malnutrition - From poor oral intake.  Follow nutrition recommendations  Goals of care - Overall prognosis is guarded to poor.  Palliative care following.  CODE STATUS has been changed to DNR  DVT prophylaxis: SCDs Code Status: DNR Family Communication: None at bedside Disposition Plan: Status is: Inpatient Remains inpatient appropriate because: Of severity of illness  Consultants: Cardiology/EP/general surgery/GI.  palliative care  Procedures: None  Antimicrobials:  Anti-infectives (From admission, onward)    Start     Dose/Rate Route Frequency Ordered Stop   01/29/24 2200  metroNIDAZOLE  (FLAGYL ) tablet 500 mg        500 mg Oral Every 12 hours 01/29/24 1627     01/22/24 2200  piperacillin -tazobactam (ZOSYN ) IVPB 3.375 g        3.375 g 12.5 mL/hr over 240 Minutes Intravenous Every 8 hours 01/22/24 1602 01/28/24 2359   01/22/24 1800  metroNIDAZOLE  (FLAGYL ) IVPB 500 mg  Status:  Discontinued        500 mg 100 mL/hr over 60 Minutes Intravenous Every 8 hours 01/22/24 1653 01/23/24 0857   01/22/24 1545  metroNIDAZOLE  (FLAGYL ) IVPB 500 mg  Status:  Discontinued        500 mg  100 mL/hr over 60 Minutes Intravenous  Once 01/22/24 1533 01/22/24 1634   01/22/24 1330  doxycycline (VIBRAMYCIN) 100 mg in sodium chloride  0.9 % 250 mL IVPB  Status:  Discontinued        100 mg 125 mL/hr over 120 Minutes Intravenous  Once 01/22/24 1320  01/22/24 1649   01/22/24 1315  azithromycin (ZITHROMAX) 500 mg in sodium chloride  0.9 % 250 mL IVPB  Status:  Discontinued        500 mg 250 mL/hr over 60 Minutes Intravenous  Once 01/22/24 1309 01/22/24 1319   01/22/24 1315  ceFEPIme  (MAXIPIME ) 2 g in sodium chloride  0.9 % 100 mL IVPB        2 g 200 mL/hr over 30 Minutes Intravenous  Once 01/22/24 1309 01/22/24 1515   01/22/24 1315  vancomycin  (VANCOREADY) IVPB 1750 mg/350 mL  Status:  Discontinued        1,750 mg 175 mL/hr over 120 Minutes Intravenous  Once 01/22/24 1311 01/22/24 1756         Subjective: Patient seen and examined at bedside.  Denies worsening abdominal pain, vomiting, fever. No rectal bleeding reported after rectal tube removal yesterday. objective: Vitals:   01/30/24 2000 01/31/24 0019 01/31/24 0401 01/31/24 0420  BP: 112/76 (!) 103/91  114/72  Pulse: (!) 113 79  100  Resp: 20 18  19   Temp: 98.6 F (37 C) 98.4 F (36.9 C)  97.8 F (36.6 C)  TempSrc: Oral Oral  Axillary  SpO2: 95% 96%  95%  Weight:   96.8 kg   Height:        Intake/Output Summary (Last 24 hours) at 01/31/2024 0754 Last data filed at 01/31/2024 0500 Gross per 24 hour  Intake 2670.2 ml  Output 2225 ml  Net 445.2 ml   Filed Weights   01/29/24 0244 01/30/24 0449 01/31/24 0401  Weight: 95.5 kg 95.8 kg 96.8 kg    Examination:  General: No acute distress.  Currently on room air.   ENT/neck: No JVD elevation or palpable neck masses noted respiratory: Bilateral decreased breath sounds at bases with scattered crackles CVS: Rate mostly controlled; S1 and S2 heard Abdominal: Soft, nontender, remains distended; no organomegaly, sounds heard.   extremities: No clubbing; trace lower extremity edema present bilaterally CNS: Alert and awake.  Remains slow to respond.  No focal neurologic deficit.  Able to move extremities Lymph: No obvious palpable lymphadenopathy Skin: No obvious petechiae/rashes psych: Showing no signs of agitation.  Flat  affect mostly. musculoskeletal: No obvious joint tenderness/erythema    Data Reviewed: I have personally reviewed following labs and imaging studies  CBC: Recent Labs  Lab 01/28/24 0400 01/29/24 0339 01/30/24 0830 01/30/24 1215 01/30/24 2027 01/31/24 0530  WBC 6.8 6.3 6.5 7.3  --  6.9  NEUTROABS  --   --  5.3  --   --  5.5  HGB 10.4* 10.4* 7.7* 7.8* 8.0* 7.5*  HCT 31.8* 31.9* 23.5* 23.4* 24.7* 23.3*  MCV 88.1 87.9 92.2 91.8  --  89.3  PLT 300 285 251 257  --  246   Basic Metabolic Panel: Recent Labs  Lab 01/26/24 0439 01/26/24 1832 01/27/24 0539 01/28/24 0400 01/29/24 0339 01/30/24 0535 01/31/24 0530  NA 134*  --  140 139 141 137 136  K 2.7*   < > 3.3* 3.9 4.0 3.7 3.8  CL 102  --  103 106 108 106 107  CO2 22  --  21* 24 21* 26 25  GLUCOSE 92  --  164* 128* 176* 188* 177*  BUN 68*  --  47* 31* 19 16 12   CREATININE 1.99*  --  1.46* 1.07 0.83 0.70 0.61  CALCIUM  7.6*  --  7.5* 7.6* 7.5* 7.4* 7.6*  MG 3.0*  --   --  2.4 2.2 1.9 1.8  PHOS 3.3  --   --  1.9* 3.4 2.9 2.8   < > = values in this interval not displayed.   GFR: Estimated Creatinine Clearance: 94.8 mL/min (by C-G formula based on SCr of 0.61 mg/dL). Liver Function Tests: Recent Labs  Lab 01/26/24 0439 01/27/24 0539 01/28/24 0400 01/29/24 0339 01/31/24 0530  AST 14* 14* 14* 14* 13*  ALT 21 20 18 16 14   ALKPHOS 52 59 58 61 48  BILITOT 0.9 0.9 0.6 0.5 0.3  PROT 4.4* 5.4* 5.0* 4.7* 4.3*  ALBUMIN 1.7* 1.8* 1.8* 1.9* 1.8*   No results for input(s): LIPASE, AMYLASE in the last 168 hours. No results for input(s): AMMONIA in the last 168 hours. Coagulation Profile: No results for input(s): INR, PROTIME in the last 168 hours. Cardiac Enzymes: No results for input(s): CKTOTAL, CKMB, CKMBINDEX, TROPONINI in the last 168 hours. BNP (last 3 results) Recent Labs    01/07/24 2301  PROBNP 15,245.0*   HbA1C: No results for input(s): HGBA1C in the last 72 hours. CBG: Recent Labs  Lab  01/30/24 1258 01/30/24 1621 01/30/24 2009 01/31/24 0000 01/31/24 0358  GLUCAP 155* 150* 171* 131* 160*   Lipid Profile: Recent Labs    01/31/24 0530  TRIG 101   Thyroid  Function Tests: No results for input(s): TSH, T4TOTAL, FREET4, T3FREE, THYROIDAB in the last 72 hours. Anemia Panel: No results for input(s): VITAMINB12, FOLATE, FERRITIN, TIBC, IRON, RETICCTPCT in the last 72 hours. Sepsis Labs: Recent Labs  Lab 01/24/24 1002 01/24/24 1857  LATICACIDVEN 1.1 1.1    Recent Results (from the past 240 hours)  Culture, blood (routine x 2)     Status: None   Collection Time: 01/22/24 12:43 PM   Specimen: BLOOD RIGHT ARM  Result Value Ref Range Status   Specimen Description BLOOD RIGHT ARM  Final   Special Requests   Final    BOTTLES DRAWN AEROBIC AND ANAEROBIC Blood Culture adequate volume   Culture   Final    NO GROWTH 5 DAYS Performed at [facility], [address], [place], [place] [address]    Report Status 01/27/2024 FINAL  Final  Culture, blood (routine x 2)     Status: None   Collection Time: 01/22/24 12:48 PM   Specimen: BLOOD RIGHT HAND  Result Value Ref Range Status   Specimen Description BLOOD RIGHT HAND  Final   Special Requests   Final    BOTTLES DRAWN AEROBIC ONLY Blood Culture results may not be optimal due to an inadequate volume of blood received in culture bottles   Culture   Final    NO GROWTH 5 DAYS Performed at [facility], [address] [place], [place] [address]    Report Status 01/27/2024 FINAL  Final  C Difficile Quick Screen w PCR reflex     Status: None   Collection Time: 01/22/24  1:10 PM   Specimen: STOOL  Result Value Ref Range Status   C Diff antigen NEGATIVE NEGATIVE Final   C Diff toxin NEGATIVE NEGATIVE Final   C Diff interpretation No C. difficile detected.  Final    Comment: Performed at [facility], [address]  [place], [place] [address]  MRSA Next Gen by PCR, Nasal     Status:  None   Collection Time: 01/22/24  1:10 PM   Specimen: Nasal Mucosa; Nasal Swab  Result Value Ref Range Status   MRSA by PCR Next Gen NOT DETECTED NOT DETECTED Final    Comment: (NOTE) The GeneXpert MRSA Assay (FDA approved for NASAL specimens only), is one component of a comprehensive MRSA colonization surveillance program. It is not intended to diagnose MRSA infection nor to guide or monitor treatment for MRSA infections. Test performance is not FDA approved in patients less than 76 years old. Performed at [facility], [address], [place], [place] [address]          Radiology Studies: CT ANGIO GI BLEED Result Date: 01/30/2024 CLINICAL DATA:  Lower gastrointestinal bleeding. EXAM: CTA ABDOMEN AND PELVIS WITHOUT AND WITH CONTRAST TECHNIQUE: Multidetector CT imaging of the abdomen and pelvis was performed using the standard protocol during bolus administration of intravenous contrast. Multiplanar reconstructed images and MIPs were obtained and reviewed to evaluate the vascular anatomy. RADIATION DOSE REDUCTION: This exam was performed according to the departmental dose-optimization program which includes automated exposure control, adjustment of the mA and/or kV according to patient size and/or use of iterative reconstruction technique. CONTRAST:  OMNIPAQUE  IOHEXOL  350 MG/ML SOLN COMPARISON:  January 22, 2024. FINDINGS: VASCULAR Aorta: Atherosclerosis of abdominal aorta is noted without aneurysm or dissection. 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 No thrombus is noted. SMA: Patent without evidence of aneurysm, dissection, vasculitis or significant stenosis. Renals: Bilateral renal arteries are patent without evidence of aneurysm, dissection, vasculitis, fibromuscular dysplasia or significant stenosis. IMA: Patent without evidence of aneurysm, dissection, vasculitis or significant stenosis. Inflow: Patent without evidence of  aneurysm, dissection, vasculitis or significant stenosis. Proximal Outflow: Bilateral common femoral and visualized portions of the superficial and profunda femoral arteries are patent without evidence of aneurysm, dissection, vasculitis or significant stenosis. Veins: No obvious venous abnormality within the limitations of this arterial phase study. Review of the MIP images confirms the above findings. NON-VASCULAR Lower chest: Minimal bilateral pleural effusions are noted with adjacent subsegmental atelectasis. Hepatobiliary: No focal liver abnormality is seen. No gallstones, gallbladder wall thickening, or biliary dilatation. Pancreas: Unremarkable. No pancreatic ductal dilatation or surrounding inflammatory changes. Spleen: Normal in size without focal abnormality. Adrenals/Urinary Tract: Probable right adrenal adenoma. Left adrenal gland is unremarkable. Stable bilateral renal cystic abnormalities as noted on prior exam. No hydronephrosis or renal obstruction is noted. Urinary bladder is unremarkable. Stomach/Bowel: The stomach is unremarkable. There is no evidence of bowel obstruction. Sigmoid diverticulosis is noted. The appendix is unremarkable. Mild wall and fold thickening of ascending colon is noted suggesting possible infectious or inflammatory colitis. No definite evidence of contrast extravasation to suggest gastrointestinal bleeding. Rectal tube is noted. Lymphatic: No adenopathy is noted. 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 Other: Small fat containing periumbilical hernia.  No ascites. Musculoskeletal: No acute or significant osseous findings. 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 No definite evidence of contrast extravasation to suggest gastrointestinal bleeding. Mild wall and fold thickening of ascending colon is noted suggesting possible infectious or inflammatory colitis. Aortic  Atherosclerosis (ICD10-I70.0). Electronically Signed   By: Lynwood Landy Raddle M.D.   On: 01/30/2024 14:25        Scheduled Meds:  [START ON 02/01/2024] Chlorhexidine  Gluconate Cloth  6 each Topical  Q0600   feeding supplement  1 Container Oral TID BM   insulin  aspart  0-9 Units Subcutaneous Q4H   metroNIDAZOLE   500 mg Oral Q12H   pantoprazole  (PROTONIX ) IV  40 mg Intravenous Q12H   sodium chloride  flush  10-40 mL Intracatheter Q12H   thiamine  (VITAMIN B1) injection  100 mg Intravenous Daily   Continuous Infusions:  amiodarone  30 mg/hr (01/30/24 2005)   cangrelor  (KENGREAL ) 50 mg in sodium chloride  0.9 % 250 mL (0.2 mg/mL) infusion 0.75 mcg/kg/min (01/31/24 0117)   TPN ADULT (ION) 80 mL/hr at 01/30/24 1721          Leolia Vinzant Cheryle, MD [facility] 01/31/2024, 7:54 AM

## 2024-01-31 NOTE — Progress Notes (Signed)
 [facility] night cross cover note:   I was notified by RN of the patient's request for a sleep aid. I subsequently placed order for prn melatonin for insomnia.     Newton Pigg, DO Hospitalist

## 2024-01-31 NOTE — Progress Notes (Signed)
 Nutrition Follow Up  DOCUMENTATION CODES:   Non-severe (moderate) malnutrition in context of acute illness/injury - although he meets criteria for malnutrition in the context of acute illness, likely has underlying chronic contributors to nutrition-related decline (i.e reported inactivity, multiple recurrent hospitalizations, and compromised cardiac status and likely prolonged inadequate energy intake r/t increased needs)  INTERVENTION:  Monitor diet advancement/tolerance to assess need for nutrition support  Continue TPN- titrate to meet estimated nutrition needs.  Kcal: 1900-2100 kcals Protein: 95-110g   Add MVI w/ minerals Thiamine  100mg  x5 days   Per pharmacy: TPN to 80 mL/hr at 1800 (goal rate 80 ml/hr) to provide 100% of estimated needs. Electrolytes in TPN: Na 20 mEq/L, K 35 mEq/L, Ca 4 mEq/L, Mg 6 mEq/L, Phos 10 mmol/L, Cl:Ac 1:2 Continue standard MVI and trace elements to TPN Thiamine  100mg  IV daily x 5 (last dose 9/9)   Continue Boost Breeze po TID, each supplement provides 250 kcal and 9 grams of protein (would like Boost Plus vanilla once advanced)  NUTRITION DIAGNOSIS:  Moderate Malnutrition related to acute illness as evidenced by energy intake < 75% for > 7 days, moderate muscle depletion, mild muscle depletion. Remains applicable  GOAL:  Patient will meet greater than or equal to 90% of their needs Met with TPN at goal rate  MONITOR:  PO intake, Supplement acceptance, Diet advancement, I & O's  REASON FOR ASSESSMENT:  NPO/Clear Liquid Diet, Rounds    ASSESSMENT:  Pt with PMH significant for: ischemic cardiomyopathy and recent LAD w/ stent placement for NSTEMI, HTN, arthritis, prostate cancer. Presented to ED with c/o nausea and vomiting. CT c/f intestinal pneumatosis w/ ischemic versus infectious colitis. Admitted w/ septic shock.   8/31 admitted 9/03 transitioned to [facility] care; diet advanced to CLD 9/04 NGT removed 9/07 FlexiSeal removed  Pt resting in bed  at time of assessment. Pt frustrated with hospital course and reports he is very eager to leave and go home. When asked about nutrition intake, pt stated he will not drink the broth, ginerale, or coffee from hospital because he does not like the taste. Diet summary documentation only shows 1 recorded meal from 9/7 which was 100%, but per pt report, he is likely not eating 100% at each meal and nutrient density of meals is low. Pt reports he did drink Ensure and eat jello which was tolerated well. Pt's fecal output 1 L yesterday prior to removal of FlexiSeal. GI continues to follow and are monitoring GI bleed before advancing pt's diet further. Reassured pt he is receiving all he needs through TPN. Will continue to monitor for diet advancement and tolerance.   Admit Weight: 88.1 kg Current Weight: 96.8 kg  Reports UBW around 205lbs and last weighed this three weeks ago. Current body weight 200lbs. Per chart review, has shown 6.9% weight loss in last five months, which is not considered clinically significant for the time frame. Per nursing, mild pitting edema BLE and generalized edema that is non-pitting. Wt had previously been stable between 88-92 kg then increased 4.4 kg over night, suspect error in current wt due to rapid increase in 24 hr. Scale may need to be zeroed out to obtain accurate wt.    Intake/Output Summary (Last 24 hours) at 01/31/2024 0850 Last data filed at 01/31/2024 0500 Gross per 24 hour  Intake 2430.2 ml  Output 2100 ml  Net 330.2 ml   Drains/Lines: L basilic: PICC (placed 9/01) UOP: 1225 ml x24 hours FlexiSeal: (prior to removal 9/7)  Meds:  SSI Novolog  q4 hr Protonix  Thiamine  100 mg  Labs:  CBGs 131-200 x24 hours A1c 6.4 (08/2023)    Diet Order:   Diet Order             Diet clear liquid Room service appropriate? Yes; Fluid consistency: Thin  Diet effective now            EDUCATION NEEDS:   No education needs have been identified at this time  Skin:   Skin Assessment: Reviewed RN Assessment  Last BM:  9/7 type 7  Height:  Ht Readings from Last 1 Encounters:  01/28/24 6' (1.829 m)   Weight:  Wt Readings from Last 1 Encounters:  01/31/24 96.8 kg    Ideal Body Weight:  80.9 kg  BMI:  Body mass index is 28.94 kg/m.  Estimated Nutritional Needs:   Kcal:  1900-2100 kcals  Protein:  95-110g  Fluid:  1.9-2.1L/day   Josette Glance, MS, RDN, LDN Clinical Dietitian I Please reach out via secure chat

## 2024-01-31 NOTE — Progress Notes (Addendum)
  Progress Note  Patient Name: Chase Combs Date of Encounter: 01/31/2024 [facility] Cardiologist: Alm Clay, MD   Interval Summary    Reports no bleeding overnight or this morning. On clear liquid diet.   Vital Signs Vitals:   01/30/24 2000 01/31/24 0019 01/31/24 0401 01/31/24 0420  BP: 112/76 (!) 103/91  114/72  Pulse: (!) 113 79  100  Resp: 20 18  19   Temp: 98.6 F (37 C) 98.4 F (36.9 C)  97.8 F (36.6 C)  TempSrc: Oral Oral  Axillary  SpO2: 95% 96%  95%  Weight:   96.8 kg   Height:        Intake/Output Summary (Last 24 hours) at 01/31/2024 0813 Last data filed at 01/31/2024 0500 Gross per 24 hour  Intake 2670.2 ml  Output 2225 ml  Net 445.2 ml      01/31/2024    4:01 AM 01/30/2024    4:49 AM 01/29/2024    2:44 AM  Last 3 Weights  Weight (lbs) 213 lb 6.5 oz 211 lb 3.2 oz 210 lb 8.6 oz  Weight (kg) 96.8 kg 95.8 kg 95.5 kg      Telemetry/ECG   Atrial fibrillation, RVR at times - Personally Reviewed  Physical Exam  GEN: No acute distress.   Neck: No JVD Cardiac: Irreg Irreg, no murmurs, rubs, or gallops.  Respiratory: Clear to auscultation bilaterally. GI: mildly distended, decreased BS MS: 1+ bilateral LE edema  Assessment & Plan   78 y.o. Caucasian male patient with coronary artery disease and history of angioplasty to proximal/ostial LAD on 01/17/2024, has prior stents to RCA and ramus intermediate which had mild disease, admitted to the hospital on 01/22/2024 with ischemic colitis.  Patient n.p.o. status, started on antiplatelet therapy with cangrelor .   CAD -- recent cardiac cath with PCI/DES to the o/pLAD on 01/17/2024. Was on DAPT with ASA/Brilinta  PTA -- has been treated with cangrelor  since admission  -- review of notes suggest possible transition to aggrastat, but given issues with GI bleed and quick half life of cangrelor  will continue for now.  -- check PRU level and can adjust doing based on findings, discussed with  pharmD  Ischemic/Inflammatory Colitis  Acute GI bleed -- gen surgery has signed off -- Hgb dropped from 10.4 (9/6) to 7.5 -- CT angio negative for acute bleed, mild wall and fold thickening of ascending colon for possible infectious/inflammatory colitis  -- rectal tube removed yesterday without further bleeding since  -- GI following with possible endoscopy +/- EGD, though difficult scenario given the need for cangrelor  -- given his CAD hx, would consider transfusion to maintain Hgb >8  New onset atrial fibrillation  -- continues in atrial fib, rates 80-100s -- on IV amiodarone , unable to anticoagulate at this time given anemia   HFrEF -- echo 01/13/2024 with LVEF of 25-30%, g1DD, moderately reduced RV, moderate LAE, mild MR -- volume stable on exam -- GDMT on hold at this time with GI issues  For questions or updates, please contact [facility] Please consult [email or web address] for contact info under       Signed, Manuelita Rummer, NP

## 2024-01-31 NOTE — Plan of Care (Signed)
  Problem: Elimination: Goal: Will not experience complications related to urinary retention Outcome: Progressing   Problem: Education: Goal: Knowledge of General Education information will improve Description: Including pain rating scale, medication(s)/side effects and non-pharmacologic comfort measures Outcome: Not Progressing   Problem: Health Behavior/Discharge Planning: Goal: Ability to manage health-related needs will improve Outcome: Not Progressing   Problem: Clinical Measurements: Goal: Ability to maintain clinical measurements within normal limits will improve Outcome: Not Progressing Goal: Diagnostic test results will improve Outcome: Not Progressing Goal: Respiratory complications will improve Outcome: Not Progressing Goal: Cardiovascular complication will be avoided Outcome: Not Progressing   Problem: Activity: Goal: Risk for activity intolerance will decrease Outcome: Not Progressing   Problem: Nutrition: Goal: Adequate nutrition will be maintained Outcome: Not Progressing   Problem: Coping: Goal: Level of anxiety will decrease Outcome: Not Progressing   Problem: Elimination: Goal: Will not experience complications related to bowel motility Outcome: Not Progressing   Problem: Pain Managment: Goal: General experience of comfort will improve and/or be controlled Outcome: Not Progressing   Problem: Skin Integrity: Goal: Risk for impaired skin integrity will decrease Outcome: Not Progressing   Problem: Coping: Goal: Ability to adjust to condition or change in health will improve Outcome: Not Progressing   Problem: Fluid Volume: Goal: Ability to maintain a balanced intake and output will improve Outcome: Not Progressing   Problem: Metabolic: Goal: Ability to maintain appropriate glucose levels will improve Outcome: Not Progressing   Problem: Tissue Perfusion: Goal: Adequacy of tissue perfusion will improve Outcome: Not Progressing

## 2024-02-01 ENCOUNTER — Inpatient Hospital Stay (HOSPITAL_COMMUNITY)
Admission: EM | Disposition: A | Discharge status: Home Health | Payer: Self-pay | Source: Home / Self Care | Attending: Internal Medicine

## 2024-02-01 ENCOUNTER — Encounter (HOSPITAL_COMMUNITY): Payer: Self-pay | Admitting: Cardiology

## 2024-02-01 ENCOUNTER — Other Ambulatory Visit (HOSPITAL_COMMUNITY): Payer: Self-pay

## 2024-02-01 ENCOUNTER — Telehealth (HOSPITAL_COMMUNITY): Payer: Self-pay | Admitting: Pharmacy Technician

## 2024-02-01 DIAGNOSIS — I5022 Chronic systolic (congestive) heart failure: Secondary | ICD-10-CM

## 2024-02-01 DIAGNOSIS — I2510 Atherosclerotic heart disease of native coronary artery without angina pectoris: Secondary | ICD-10-CM | POA: Diagnosis not present

## 2024-02-01 DIAGNOSIS — I2102 ST elevation (STEMI) myocardial infarction involving left anterior descending coronary artery: Secondary | ICD-10-CM | POA: Diagnosis not present

## 2024-02-01 DIAGNOSIS — I10 Essential (primary) hypertension: Secondary | ICD-10-CM

## 2024-02-01 DIAGNOSIS — K559 Vascular disorder of intestine, unspecified: Secondary | ICD-10-CM | POA: Diagnosis not present

## 2024-02-01 DIAGNOSIS — D62 Acute posthemorrhagic anemia: Secondary | ICD-10-CM | POA: Diagnosis not present

## 2024-02-01 DIAGNOSIS — I2109 ST elevation (STEMI) myocardial infarction involving other coronary artery of anterior wall: Secondary | ICD-10-CM | POA: Diagnosis not present

## 2024-02-01 DIAGNOSIS — I4891 Unspecified atrial fibrillation: Secondary | ICD-10-CM

## 2024-02-01 HISTORY — PX: CORONARY/GRAFT ACUTE MI REVASCULARIZATION: CATH118305

## 2024-02-01 LAB — CBC WITH DIFFERENTIAL/PLATELET
Abs Immature Granulocytes: 0.27 K/uL — ABNORMAL HIGH (ref 0.00–0.07)
Basophils Absolute: 0 K/uL (ref 0.0–0.1)
Basophils Relative: 0 %
Eosinophils Absolute: 0.2 K/uL (ref 0.0–0.5)
Eosinophils Relative: 2 %
HCT: 27.5 % — ABNORMAL LOW (ref 39.0–52.0)
Hemoglobin: 9.2 g/dL — ABNORMAL LOW (ref 13.0–17.0)
Immature Granulocytes: 3 %
Lymphocytes Relative: 8 %
Lymphs Abs: 0.7 K/uL (ref 0.7–4.0)
MCH: 29.2 pg (ref 26.0–34.0)
MCHC: 33.5 g/dL (ref 30.0–36.0)
MCV: 87.3 fL (ref 80.0–100.0)
Monocytes Absolute: 0.5 K/uL (ref 0.1–1.0)
Monocytes Relative: 5 %
Neutro Abs: 8.1 K/uL — ABNORMAL HIGH (ref 1.7–7.7)
Neutrophils Relative %: 82 %
Platelets: 291 K/uL (ref 150–400)
RBC: 3.15 MIL/uL — ABNORMAL LOW (ref 4.22–5.81)
RDW: 18.2 % — ABNORMAL HIGH (ref 11.5–15.5)
WBC: 9.8 K/uL (ref 4.0–10.5)
nRBC: 0.8 % — ABNORMAL HIGH (ref 0.0–0.2)

## 2024-02-01 LAB — CBC
HCT: 26.3 % — ABNORMAL LOW (ref 39.0–52.0)
Hemoglobin: 8.5 g/dL — ABNORMAL LOW (ref 13.0–17.0)
MCH: 28.7 pg (ref 26.0–34.0)
MCHC: 32.3 g/dL (ref 30.0–36.0)
MCV: 88.9 fL (ref 80.0–100.0)
Platelets: 267 K/uL (ref 150–400)
RBC: 2.96 MIL/uL — ABNORMAL LOW (ref 4.22–5.81)
RDW: 18.1 % — ABNORMAL HIGH (ref 11.5–15.5)
WBC: 12.5 K/uL — ABNORMAL HIGH (ref 4.0–10.5)
nRBC: 0.2 % (ref 0.0–0.2)

## 2024-02-01 LAB — RENAL FUNCTION PANEL
Albumin: 1.9 g/dL — ABNORMAL LOW (ref 3.5–5.0)
Anion gap: 9 (ref 5–15)
BUN: 15 mg/dL (ref 8–23)
CO2: 24 mmol/L (ref 22–32)
Calcium: 7.9 mg/dL — ABNORMAL LOW (ref 8.9–10.3)
Chloride: 97 mmol/L — ABNORMAL LOW (ref 98–111)
Creatinine, Ser: 0.69 mg/dL (ref 0.61–1.24)
GFR, Estimated: >60 mL/min (ref >60)
Glucose, Bld: 721 mg/dL (ref 70–99)
Phosphorus: 5.3 mg/dL — ABNORMAL HIGH (ref 2.5–4.6)
Potassium: 5.4 mmol/L — ABNORMAL HIGH (ref 3.5–5.1)
Sodium: 130 mmol/L — ABNORMAL LOW (ref 135–145)

## 2024-02-01 LAB — BASIC METABOLIC PANEL WITH GFR
Anion gap: 9 (ref 5–15)
BUN: 16 mg/dL (ref 8–23)
CO2: 24 mmol/L (ref 22–32)
Calcium: 8.1 mg/dL — ABNORMAL LOW (ref 8.9–10.3)
Chloride: 103 mmol/L (ref 98–111)
Creatinine, Ser: 0.64 mg/dL (ref 0.61–1.24)
GFR, Estimated: >60 mL/min (ref >60)
Glucose, Bld: 178 mg/dL — ABNORMAL HIGH (ref 70–99)
Potassium: 3.8 mmol/L (ref 3.5–5.1)
Sodium: 136 mmol/L (ref 135–145)

## 2024-02-01 LAB — PHOSPHORUS: Phosphorus: 3.6 mg/dL (ref 2.5–4.6)

## 2024-02-01 LAB — POCT ACTIVATED CLOTTING TIME
Activated Clotting Time: 308 s
Activated Clotting Time: 210 s
Activated Clotting Time: 245 s
Activated Clotting Time: 400 s

## 2024-02-01 LAB — GLUCOSE, CAPILLARY
Glucose-Capillary: 165 mg/dL — ABNORMAL HIGH (ref 70–99)
Glucose-Capillary: 172 mg/dL — ABNORMAL HIGH (ref 70–99)
Glucose-Capillary: 173 mg/dL — ABNORMAL HIGH (ref 70–99)
Glucose-Capillary: 174 mg/dL — ABNORMAL HIGH (ref 70–99)
Glucose-Capillary: 205 mg/dL — ABNORMAL HIGH (ref 70–99)
Glucose-Capillary: 250 mg/dL — ABNORMAL HIGH (ref 70–99)

## 2024-02-01 LAB — COOXEMETRY PANEL
Carboxyhemoglobin: 2.7 % — ABNORMAL HIGH (ref 0.5–1.5)
Methemoglobin: <0.7 % (ref 0.0–1.5)
O2 Saturation: 43.7 %
Total hemoglobin: 7.8 g/dL — ABNORMAL LOW (ref 12.0–16.0)

## 2024-02-01 LAB — PREPARE RBC (CROSSMATCH)

## 2024-02-01 LAB — MAGNESIUM: Magnesium: 2.3 mg/dL (ref 1.7–2.4)

## 2024-02-01 LAB — CG4 I-STAT (LACTIC ACID)
Lactic Acid, Venous: 0.7 mmol/L (ref 0.5–1.9)
Lactic Acid, Venous: 0.6 mmol/L (ref 0.5–1.9)

## 2024-02-01 SURGERY — CORONARY/GRAFT ACUTE MI REVASCULARIZATION
Anesthesia: LOCAL

## 2024-02-01 MED ORDER — FENTANYL CITRATE (PF) 100 MCG/2ML IJ SOLN
INTRAMUSCULAR | Status: DC | PRN
  Administered 2024-02-01 (×4): 25 ug via INTRAVENOUS

## 2024-02-01 MED ORDER — LIDOCAINE HCL (PF) 1 % IJ SOLN
INTRAMUSCULAR | Status: DC | PRN
  Administered 2024-02-01: 2 mL via INTRADERMAL

## 2024-02-01 MED ORDER — POTASSIUM CHLORIDE CRYS ER 20 MEQ PO TBCR
40.0000 meq | EXTENDED_RELEASE_TABLET | Freq: Once | ORAL | Status: AC
  Administered 2024-02-01: 40 meq via ORAL
  Filled 2024-02-01: qty 2

## 2024-02-01 MED ORDER — ASPIRIN 325 MG PO TABS
ORAL_TABLET | ORAL | Status: DC | PRN
  Administered 2024-02-01: 325 mg via ORAL

## 2024-02-01 MED ORDER — FUROSEMIDE 10 MG/ML IJ SOLN
40.0000 mg | Freq: Once | INTRAMUSCULAR | Status: AC
  Administered 2024-02-01: 40 mg via INTRAVENOUS
  Filled 2024-02-01: qty 4

## 2024-02-01 MED ORDER — SODIUM CHLORIDE 0.9% FLUSH: 3.0000 mL | INTRAVENOUS | Status: DC | PRN

## 2024-02-01 MED ORDER — MIDAZOLAM HCL 2 MG/2ML IJ SOLN
INTRAMUSCULAR | Status: DC | PRN
  Administered 2024-02-01: 1 mg via INTRAVENOUS

## 2024-02-01 MED ORDER — FUROSEMIDE 10 MG/ML IJ SOLN
80.0000 mg | Freq: Two times a day (BID) | INTRAMUSCULAR | Status: DC
Start: 2024-02-01 — End: 2024-02-03
  Administered 2024-02-01 – 2024-02-03 (×4): 80 mg via INTRAVENOUS
  Filled 2024-02-01 (×4): qty 8

## 2024-02-01 MED ORDER — ONDANSETRON HCL 4 MG/2ML IJ SOLN
INTRAMUSCULAR | Status: DC | PRN
  Administered 2024-02-01: 4 mg via INTRAVENOUS

## 2024-02-01 MED ORDER — SODIUM CHLORIDE 0.9 % IV SOLN
INTRAVENOUS | Status: AC | PRN
  Administered 2024-02-01: 4 ug/kg/min via INTRAVENOUS

## 2024-02-01 MED ORDER — SODIUM CHLORIDE 0.9 % IV SOLN: 250.0000 mL | INTRAVENOUS | Status: AC | PRN

## 2024-02-01 MED ORDER — AMIODARONE HCL IN DEXTROSE 360-4.14 MG/200ML-% IV SOLN
60.0000 mg/h | INTRAVENOUS | Status: AC
  Administered 2024-02-01 (×2): 60 mg/h via INTRAVENOUS
  Filled 2024-02-01: qty 400

## 2024-02-01 MED ORDER — CANGRELOR TETRASODIUM 50 MG IV SOLR
INTRAVENOUS | Status: AC
  Filled 2024-02-01: qty 50

## 2024-02-01 MED ORDER — FREE WATER: 500.0000 mL | Freq: Once | Status: DC

## 2024-02-01 MED ORDER — VERAPAMIL HCL 2.5 MG/ML IV SOLN
INTRAVENOUS | Status: DC | PRN
  Administered 2024-02-01: 10 mL via INTRA_ARTERIAL

## 2024-02-01 MED ORDER — ORAL CARE MOUTH RINSE: 15.0000 mL | OROMUCOSAL | Status: DC | PRN

## 2024-02-01 MED ORDER — HEPARIN SODIUM (PORCINE) 1000 UNIT/ML IJ SOLN
INTRAMUSCULAR | Status: AC
  Filled 2024-02-01: qty 10

## 2024-02-01 MED ORDER — HEPARIN SODIUM (PORCINE) 1000 UNIT/ML IJ SOLN
INTRAMUSCULAR | Status: DC | PRN
  Administered 2024-02-01: 4000 [IU] via INTRAVENOUS
  Administered 2024-02-01: 5000 [IU] via INTRAVENOUS
  Administered 2024-02-01: 4000 [IU] via INTRAVENOUS

## 2024-02-01 MED ORDER — HYDROMORPHONE HCL 1 MG/ML IJ SOLN
1.0000 mg | INTRAMUSCULAR | Status: DC | PRN
  Administered 2024-02-01: 1 mg via INTRAVENOUS
  Filled 2024-02-01: qty 1

## 2024-02-01 MED ORDER — SODIUM CHLORIDE 0.9 % IV SOLN
2.0000 g | INTRAVENOUS | Status: DC
  Administered 2024-02-01 – 2024-02-02 (×2): 2 g via INTRAVENOUS
  Filled 2024-02-01 (×2): qty 20

## 2024-02-01 MED ORDER — HYDRALAZINE HCL 20 MG/ML IJ SOLN: 10.0000 mg | INTRAMUSCULAR | Status: AC | PRN

## 2024-02-01 MED ORDER — SODIUM CHLORIDE 0.9% FLUSH
3.0000 mL | Freq: Two times a day (BID) | INTRAVENOUS | Status: DC
Start: 2024-02-01 — End: 2024-02-23
  Administered 2024-02-01 – 2024-02-23 (×33): 3 mL via INTRAVENOUS

## 2024-02-01 MED ORDER — ONDANSETRON HCL 40 MG/20ML IJ SOLN
8.0000 mg | Freq: Once | INTRAMUSCULAR | Status: DC
  Filled 2024-02-01: qty 4

## 2024-02-01 MED ORDER — AMIODARONE HCL IN DEXTROSE 360-4.14 MG/200ML-% IV SOLN
30.0000 mg/h | INTRAVENOUS | Status: DC
  Administered 2024-02-02: 30 mg/h via INTRAVENOUS
  Administered 2024-02-02 (×3): 60 mg/h via INTRAVENOUS
  Administered 2024-02-02: 30 mg/h via INTRAVENOUS
  Administered 2024-02-03 (×4): 60 mg/h via INTRAVENOUS
  Administered 2024-02-04: 30 mg/h via INTRAVENOUS
  Administered 2024-02-04: 60 mg/h via INTRAVENOUS
  Administered 2024-02-05 – 2024-02-11 (×13): 30 mg/h via INTRAVENOUS
  Filled 2024-02-01 (×21): qty 200

## 2024-02-01 MED ORDER — IOHEXOL 350 MG/ML SOLN
INTRAVENOUS | Status: DC | PRN
  Administered 2024-02-01: 150 mL

## 2024-02-01 MED ORDER — VERAPAMIL HCL 2.5 MG/ML IV SOLN
INTRAVENOUS | Status: AC
  Filled 2024-02-01: qty 2

## 2024-02-01 MED ORDER — HYDROMORPHONE HCL 1 MG/ML IJ SOLN: 0.5000 mg | Freq: Once | INTRAMUSCULAR | Status: AC

## 2024-02-01 MED ORDER — MIDAZOLAM HCL 2 MG/2ML IJ SOLN
INTRAMUSCULAR | Status: AC
  Filled 2024-02-01: qty 2

## 2024-02-01 MED ORDER — CLOPIDOGREL BISULFATE 75 MG PO TABS: 75.0000 mg | ORAL_TABLET | Freq: Every day | ORAL | Status: DC

## 2024-02-01 MED ORDER — AMIODARONE HCL 200 MG PO TABS
200.0000 mg | ORAL_TABLET | Freq: Two times a day (BID) | ORAL | Status: DC
  Administered 2024-02-01: 200 mg via ORAL
  Filled 2024-02-01: qty 1

## 2024-02-01 MED ORDER — SODIUM CHLORIDE 0.9 % IV SOLN
0.7500 ug/kg/min | INTRAVENOUS | Status: DC
  Administered 2024-02-01 – 2024-02-03 (×5): 0.75 ug/kg/min via INTRAVENOUS
  Filled 2024-02-01 (×7): qty 50

## 2024-02-01 MED ORDER — ACETAMINOPHEN 325 MG PO TABS
650.0000 mg | ORAL_TABLET | ORAL | Status: DC | PRN
  Administered 2024-02-01 – 2024-02-20 (×5): 650 mg via ORAL
  Filled 2024-02-01 (×6): qty 2

## 2024-02-01 MED ORDER — ONDANSETRON HCL 4 MG/2ML IJ SOLN
INTRAMUSCULAR | Status: AC
  Filled 2024-02-01: qty 2

## 2024-02-01 MED ORDER — MILRINONE LACTATE IN DEXTROSE 20-5 MG/100ML-% IV SOLN
0.2500 ug/kg/min | INTRAVENOUS | Status: DC
  Administered 2024-02-01 – 2024-02-04 (×6): 0.25 ug/kg/min via INTRAVENOUS
  Filled 2024-02-01 (×5): qty 100

## 2024-02-01 MED ORDER — ALPRAZOLAM 0.25 MG PO TABS
0.2500 mg | ORAL_TABLET | Freq: Three times a day (TID) | ORAL | Status: DC | PRN
  Administered 2024-02-01 – 2024-02-24 (×18): 0.25 mg via ORAL
  Filled 2024-02-01 (×20): qty 1

## 2024-02-01 MED ORDER — SPIRONOLACTONE 25 MG PO TABS: 25.0000 mg | ORAL_TABLET | Freq: Every day | ORAL | Status: DC

## 2024-02-01 MED ORDER — MILRINONE LACTATE IN DEXTROSE 20-5 MG/100ML-% IV SOLN
INTRAVENOUS | Status: AC
  Filled 2024-02-01: qty 100

## 2024-02-01 MED ORDER — CLOPIDOGREL BISULFATE 75 MG PO TABS
300.0000 mg | ORAL_TABLET | Freq: Once | ORAL | Status: AC
  Administered 2024-02-01: 300 mg via ORAL
  Filled 2024-02-01: qty 4

## 2024-02-01 MED ORDER — FUROSEMIDE 10 MG/ML IJ SOLN: 40.0000 mg | Freq: Once | INTRAMUSCULAR | Status: DC

## 2024-02-01 MED ORDER — SODIUM CHLORIDE 0.9% IV SOLUTION: Freq: Once | INTRAVENOUS | Status: DC

## 2024-02-01 MED ORDER — EMPAGLIFLOZIN 10 MG PO TABS
10.0000 mg | ORAL_TABLET | Freq: Every day | ORAL | Status: DC
  Administered 2024-02-01: 10 mg via ORAL
  Filled 2024-02-01: qty 1

## 2024-02-01 MED ORDER — FENTANYL CITRATE (PF) 100 MCG/2ML IJ SOLN
INTRAMUSCULAR | Status: AC
  Filled 2024-02-01: qty 2

## 2024-02-01 MED ORDER — IBUPROFEN 600 MG PO TABS
600.0000 mg | ORAL_TABLET | Freq: Once | ORAL | Status: AC
  Administered 2024-02-01: 600 mg via ORAL
  Filled 2024-02-01: qty 1

## 2024-02-01 MED ORDER — TRAVASOL 10 % IV SOLN
INTRAVENOUS | Status: AC
  Filled 2024-02-01: qty 998.4

## 2024-02-01 MED ORDER — ENSURE PLUS HIGH PROTEIN PO LIQD
237.0000 mL | Freq: Three times a day (TID) | ORAL | Status: DC
  Administered 2024-02-01 (×2): 237 mL via ORAL

## 2024-02-01 MED ORDER — CLOPIDOGREL BISULFATE 300 MG PO TABS
ORAL_TABLET | ORAL | Status: AC
  Filled 2024-02-01: qty 1

## 2024-02-01 MED ORDER — LABETALOL HCL 5 MG/ML IV SOLN: 10.0000 mg | INTRAVENOUS | Status: AC | PRN

## 2024-02-01 MED ORDER — ASPIRIN 81 MG PO CHEW
CHEWABLE_TABLET | ORAL | Status: AC
  Filled 2024-02-01: qty 4

## 2024-02-01 SURGICAL SUPPLY — 20 items
BALLOON EMERGE MR 2.0X8 (BALLOONS) IMPLANT
BALLOON SAPPHIRE 2.0X12 (BALLOONS) IMPLANT
BALLOON TAKERU 1.5X6 (BALLOONS) IMPLANT
BALLOON ~~LOC~~ EUPHORA RX 3.0X12 (BALLOONS) IMPLANT
CATH EXTRAC PRONTO 5.5F 138CM (CATHETERS) IMPLANT
CATH GUIDELINER COAST (CATHETERS) IMPLANT
CATH LAUNCHER 6FR EBU 3 (CATHETERS) IMPLANT
CATH LAUNCHER 6FR EBU 3.75 (CATHETERS) IMPLANT
CATH OPTICROSS HD (CATHETERS) IMPLANT
DEVICE RAD COMP TR BAND LRG (VASCULAR PRODUCTS) IMPLANT
DRAPE IVUS SLED (BAG) IMPLANT
ELECT DEFIB PAD ADLT CADENCE (PAD) IMPLANT
GLIDESHEATH SLEND A-KIT 6F 22G (SHEATH) IMPLANT
GUIDEWIRE INQWIRE 1.5J.035X260 (WIRE) IMPLANT
KIT ENCORE 26 ADVANTAGE (KITS) IMPLANT
KIT PV (KITS) IMPLANT
PACK CARDIAC CATHETERIZATION (CUSTOM PROCEDURE TRAY) ×1 IMPLANT
WIRE ASAHI GRAND SLAM 180CM (WIRE) IMPLANT
WIRE HI TORQ WIGGLE 190CM (WIRE) IMPLANT
WIRE RUNTHROUGH .014X180CM (WIRE) IMPLANT

## 2024-02-01 NOTE — Progress Notes (Signed)
 Patient heart rate started racing up, upon assessment pt was restless,diaphoretic complained of rt shoulder pain says that is his chromic pain ,iv dilaudid  0.5 mg given,vitals taken 12 lead EKG done shows STEMI,NP Manuelita paged both MD and NP at bedside,Pt taken to cath lab for procedure

## 2024-02-01 NOTE — Progress Notes (Signed)
 Chaplain responded to Code STEMI. I escorted Tay's wife Sharman to the 2H waiting area. She shared some of her emotions and we prayed together. Chaplains remain available.

## 2024-02-01 NOTE — Progress Notes (Addendum)
 78 year old male with hypertension, hyperlipidemia, OSA, CAD, HFrEF, persistent A-fib, GI bleed  PCI on 01/17/2024 to ostial-proximal LAD, complicated by probable ischemic colitis and GI bleeding requiring interruption of DAPT.  Patient was on IV cangrelor  for several days, transition to p.o. Lasix  with 300 mg load this morning, following stabilization of his GI bleed with medical management and PRBC transfusion.  At around 1:15 PM today, patient started having right shoulder pain and started feeling diaphoretic.  EKG showed new anterolateral ST elevation.  Patient is a mildly labored breathing, has minimal Rales.  He is tachycardic, blood pressure is normal.  I suspect he has likely occluded his recently placed LAD stent with several days of likely suboptimal platelet inhibition.  I discussed with the patient and wife regarding the urgency of the situation and need to perform cardiac catheterization and possible intervention, hopefully with only aspiration thrombectomy and balloon angioplasty to potentially occluded LAD stent.  Risk of bleeding remains.  However, fortunately, his GI bleeding is stabilized and last 24-48 hours.  Hopefully, he will tolerate Plavix  better now than before, we may even need to consider adding aspirin  for adequate platelet inhibition.  Given his low EF 25-30% and possible stent thrombosis, he is at risk for further decompensation, including heart failure, cardiogenic shock, death.  Patient was previously DNR, but has agreed to rescind his DNR status for this procedure.  Will proceed with urgent cardiac catheterization.  CRITICAL CARE Performed by: Newman Lawrence   Total critical care time: 30 minutes   Critical care time was exclusive of separately billable procedures and treating other patients.   Critical care was necessary to treat or prevent imminent or life-threatening deterioration.   Critical care was time spent personally by me on the following activities:  development of treatment plan with patient and/or surrogate as well as nursing, discussions with consultants, evaluation of patient's response to treatment, examination of patient, obtaining history from patient or surrogate, ordering and performing treatments and interventions, ordering and review of laboratory studies, ordering and review of radiographic studies, pulse oximetry and re-evaluation of patient's condition.     Newman JINNY Lawrence, MD

## 2024-02-01 NOTE — TOC Progression Note (Signed)
 Transition of Care [facility]) - Progression Note    Patient Details  Name: Chase Combs MRN: [id number] Date of Birth: [birth year]  [facility] CM/SW Contact  Justina Delcia Czar, RN Phone Number: [phone] 02/01/2024, 5:42 PM  Clinical Narrative:  Readmission   Chart reviewed for discharge readiness, patient not medically stable for d/c. Inpatient CM/CSW will continue to monitor pt's advancement through interdisciplinary progression rounds. If new pt transition needs arise, MD please place a TOC consult.     Eliquis 5 mg and the current 30 day co-pay is $47.00   Expected Discharge Plan: Home/Self Care Barriers to Discharge: Continued Medical Work up    Expected Discharge Plan and Services   Living arrangements for the past 2 months: Single Family Home    Social Drivers of Health (SDOH) Interventions SDOH Screenings   Food Insecurity: No Food Insecurity (01/27/2024)  Housing: Low Risk  (01/27/2024)  Transportation Needs: No Transportation Needs (01/27/2024)  Utilities: Not At Risk (01/27/2024)  Social Connections: Socially Integrated (01/27/2024)  Tobacco Use: Low Risk  (01/22/2024)    Readmission Risk Interventions    01/26/2024   12:46 PM 10/21/2023   11:36 AM  Readmission Risk Prevention Plan  Transportation Screening Complete Complete  PCP or Specialist Appt within 5-7 Days  Complete  Home Care Screening  Complete  Medication Review (RN CM)  Complete  Medication Review (RN Care Manager) Referral to Pharmacy   PCP or Specialist appointment within 3-5 days of discharge Complete   HRI or Home Care Consult Complete   SW Recovery Care/Counseling Consult Complete   Palliative Care Screening Not Applicable   Skilled Nursing Facility Not Applicable

## 2024-02-01 NOTE — Progress Notes (Signed)
 NAME:  KRISTINA BERTONE, MRN:  [id number], DOB:  1945-09-02, LOS: 10 ADMISSION DATE:  01/22/2024, CONSULTATION DATE:  01/22/24 REFERRING MD:  EDP, CHIEF COMPLAINT:  recurrent STEMI  History of Present Illness:  78 year old man w/ hx of ischemic cardiomyopathy and very recent LAD stent placement for NSTEMI discharged 8/27 (no d/c summary so unclear) with progressive nausea vomiting with CT images concerning for ischemic colitis.  Feeling weak and unwell since discharge 3 days ago.  Progressive nausea and vomiting not keeping down his antiplatelet medicines.  CT images reveal intestinal air in the wall of the intestine as well as concerning findings of ileus.  His lactate was elevated.  He was volume resuscitated.  Remained hypotensive.  Placed on broad-spectrum antibiotics he received vancomycin  Zosyn  doxycycline and Flagyl .  Labs notable for acute renal failure.  Anion gap metabolic acidosis.  Leukopenia, lymphopenia.  Had previously been sent out of ICU. Had ileus requiring TPN, now on clears. Had GIB requiring 2 units pRBC in the past few days, never able to be scoped due to need for need for ongoing AP therapy. This morning his cangrelor  was d/c and plavix  was started; several hours later he developed sudden onset shoulder pain, diaphoresis. EKG with anterior ST elevations. Taken for [facility]. Transferring to the ICU post-procedure. Afib with RVR in LHC, no on amiodarone . LVEDP 28. Heparin  1200 units during cath.  Pertinent  Medical History  CAD, CHF  Significant Hospital Events: Including procedures, antibiotic start and stop dates in addition to other pertinent events   8/30 admitted with pneumatosis intestinalis/ileus.  General surgery recommend conservative management 9/8 1 unit pRBC 9/9 1 unit pRBC, switched off kangrelor to plavix > recurrent STEMI with in-stent stenosis  Interim History / Subjective:    Objective    Blood pressure 99/79, pulse 96, temperature 98.2 F (36.8 C), temperature  source Oral, resp. rate (!) 26, height 6' (1.829 m), weight 95.9 kg, SpO2 97%.        Intake/Output Summary (Last 24 hours) at 02/01/2024 1533 Last data filed at 02/01/2024 9346 Gross per 24 hour  Intake 3777.01 ml  Output 2550 ml  Net 1227.01 ml   Filed Weights   01/30/24 0449 01/31/24 0401 02/01/24 0500  Weight: 95.8 kg 96.8 kg 95.9 kg    Examination: General: chronically ill appearing man sitting up in bed in NAD HEENT: [place]/AT, eyes anicteric, lip pallor Neuro: awake, alert, moving all extremities, answering questions appropriately Chest: breathing comfortably on RA, CTAB Heart: S1S2, RRR Abdomen: soft, NT Extremities: mild RUE, R radial TR band  BUN 16 Cr 0.64 WBC 9.8 H/H 9.2/27.5 Platelets 291   Resolved problem list  Severe sepsis Lactic acidosis AKI due to septic ATN  Hypokalemia hyponatremia  Assessment and Plan   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; concern for plavix  failure Chronic HFrEF 2/2 ICM -s/p LHC with balloon dilation -back on cangrelor  -holding aspirin  with recent GIB -statin -tele monitoring -appreciate cardiology's management  Afib with RVR- new onset this admission -amiodarone  -Hold full dose AC due to concern for bleeding; per GI would consider restarting later this week or next week  Intestinal pneumatosis concerning for ischemic vs infectious colitis: Severe arthrosclerotic disease as evidenced by severe CAD Severe ileus -TPN -con't PPI -CBC q8h with heparin  given in cath lab -con't cangrelor  -con't flagyl , adding ceftriaxone  with worsening leukocytosis  ABLA, GIB of unknown origin-- potentially due to rectal tube -don't replace rectal tube -high dose PPI    Ansley Mangiapane  SHAUNNA Gaskins, DO 02/01/24 4:00 PM [facility]  For contact information, see Amion. If no response to pager, please call PCCM consult pager. After hours, 7PM- 7AM, please call Elink.

## 2024-02-01 NOTE — Telephone Encounter (Signed)

## 2024-02-01 NOTE — Progress Notes (Signed)
 PROGRESS NOTE    Chase Combs  FMW:[id number] DOB: 1945-10-03 DOA: 01/22/2024 PCP: Wonda Worth SQUIBB, PA   Brief Narrative:  78 year old male with history of ischemic cardiomyopathy with recent EF of 25%, CAD/recent non-STEMI requiring LAD stent placement and discharged from the hospital on 01/19/2024, hypertension, hyperlipidemia, diabetes mellitus type 2, obesity and paroxysmal presented with nausea, vomiting with CT images concerning for intestinal pneumatosis with ischemic versus infectious colitis.  Patient was in septic shock, admitted to ICU under PCCM service.  He was started on broad-spectrum antibiotics.  Cardiology and general surgery consulted.  Patient was started on TPN.  Subsequently, GI was consulted on 01/28/2024.  Care transferred to TRH service from 01/29/2024 onwards.  Subsequently, general surgery signed off on 01/29/2024.  Patient had rectal bleeding in the rectal tube; rectal tube was removed as per GI recommendations; rectal bleeding improving.  GI signed off on 01/31/2024.  Assessment & Plan:   Septic shock: Present on admission: Due to intra-abdominal source; resolved Severe intestinal pneumatosis with ischemic versus infectious colitis/ileus - General Surgery signed off on 01/29/2024.  Recommending conservative management.  X-ray of abdomen on 01/28/2024 showed diffuse gaseous distention of the entire colon.  GI was subsequently consulted. - Off pressors.  Care transferred to TRH service from 01/29/2024 onwards. - Patient has completed 7 days of IV Zosyn ; antibiotic discontinued on 01/28/2024 - Currently on TPN.  Diet advancement as per GI: GI recommended on 01/30/2024 to advance diet to low residue soft diet as tolerated - Patient had bloody bowel movements in his rectal tube since 01/29/2024.  Rectal tube discontinued on 01/30/2024 as per GI recommendations.  Monitor H&H.  Hemoglobin 7.5 on 01/31/2024.  Status post 2 units packed red cell transfusion.  Hemoglobin 9.2 this morning.  Subcutaneous  heparin  has been discontinued. GI signed off on 01/31/2024 and recommended to restart Plavix  today 8 hemoglobin continued to remain stable with no evidence of ongoing GI bleed and if no further bleeding, can restart Eliquis later this week or next week.  Possible aspiration pneumonia - Currently on room air.  Completed 1 week course of antibiotics as discussed above.  Nonoliguric AKI due to septic ATN -Creatinine 2.55 on presentation.  Improved with IV fluids.   New onset systolic heart failure/suspected ischemic cardiomyopathy CAD with recent LAD stent - Echo on 01/13/2024 during last hospitalization had shown EF of 25 to 30%.  Strict input and output.  Daily weights.  Fluid restriction. - Cardiology following.  Currently on cangrelor  and cardiology is planning to switch to oral Plavix  today if no more rectal bleeding.  Not having any chest pain.  Paroxysmal A-fib with RVR - Currently rate controlled.  Currently on amiodarone  drip.  Anticoagulation on hold for now.  Hyponatremia - Mild.  Monitor  Hyperkalemia - Labs this morning show potassium of 5.4 and glucose of 721: May be error.  Will repeat BMP again this morning.  Acute metabolic acidosis - Improved  Acute blood loss anemia on top of anemia of chronic disease - Due to rectal bleeding.  History send as above.  Monitor H&H.    Hypoalbuminemia Moderate malnutrition - From poor oral intake.  Continue TPN.  Follow nutrition recommendations  Goals of care - Overall prognosis is guarded to poor.  Palliative care following.  CODE STATUS has been changed to DNR  DVT prophylaxis: SCDs Code Status: DNR Family Communication: None at bedside Disposition Plan: Status is: Inpatient Remains inpatient appropriate because: Of severity of illness  Consultants: Cardiology/EP/general surgery/GI.  palliative care  Procedures: None  Antimicrobials:  Anti-infectives (From admission, onward)    Start     Dose/Rate Route Frequency  Ordered Stop   01/29/24 2200  metroNIDAZOLE  (FLAGYL ) tablet 500 mg        500 mg Oral Every 12 hours 01/29/24 1627     01/22/24 2200  piperacillin -tazobactam (ZOSYN ) IVPB 3.375 g        3.375 g 12.5 mL/hr over 240 Minutes Intravenous Every 8 hours 01/22/24 1602 01/28/24 2359   01/22/24 1800  metroNIDAZOLE  (FLAGYL ) IVPB 500 mg  Status:  Discontinued        500 mg 100 mL/hr over 60 Minutes Intravenous Every 8 hours 01/22/24 1653 01/23/24 0857   01/22/24 1545  metroNIDAZOLE  (FLAGYL ) IVPB 500 mg  Status:  Discontinued        500 mg 100 mL/hr over 60 Minutes Intravenous  Once 01/22/24 1533 01/22/24 1634   01/22/24 1330  doxycycline (VIBRAMYCIN) 100 mg in sodium chloride  0.9 % 250 mL IVPB  Status:  Discontinued        100 mg 125 mL/hr over 120 Minutes Intravenous  Once 01/22/24 1320 01/22/24 1649   01/22/24 1315  azithromycin (ZITHROMAX) 500 mg in sodium chloride  0.9 % 250 mL IVPB  Status:  Discontinued        500 mg 250 mL/hr over 60 Minutes Intravenous  Once 01/22/24 1309 01/22/24 1319   01/22/24 1315  ceFEPIme  (MAXIPIME ) 2 g in sodium chloride  0.9 % 100 mL IVPB        2 g 200 mL/hr over 30 Minutes Intravenous  Once 01/22/24 1309 01/22/24 1515   01/22/24 1315  vancomycin  (VANCOREADY) IVPB 1750 mg/350 mL  Status:  Discontinued        1,750 mg 175 mL/hr over 120 Minutes Intravenous  Once 01/22/24 1311 01/22/24 1756         Subjective: Patient seen and examined at bedside.  Does not feel well.  Feels anxious.  Hoping for more solid food today.  Had 3 bowel movements last evening but no frank blood noted.  No chest pain, worsening abdominal, or vomiting reported. objective: Vitals:   02/01/24 0500 02/01/24 0559 02/01/24 0730 02/01/24 0740  BP:  114/80 123/86   Pulse:   89 (!) 101  Resp:  (!) 25 (!) 22 (!) 30  Temp:  97.9 F (36.6 C) 98 F (36.7 C)   TempSrc:  Oral Oral   SpO2:   99% 98%  Weight: 95.9 kg     Height:        Intake/Output Summary (Last 24 hours) at 02/01/2024  0814 Last data filed at 02/01/2024 0653 Gross per 24 hour  Intake 4817.01 ml  Output 2550 ml  Net 2267.01 ml   Filed Weights   01/30/24 0449 01/31/24 0401 02/01/24 0500  Weight: 95.8 kg 96.8 kg 95.9 kg    Examination:  General: Remains on room air and in no distress.  Looks chronically ill and deconditioned.   ENT/neck: No obvious thyromegaly or JVD elevation noted  respiratory: Breath sounds at bases bilaterally with some crackles and intermittent tachypnea  CVS: S1-S2 heard; currently rate controlled  abdominal: Soft, nontender, distended slightly; no organomegaly, bowel sounds are normally heard  extremities: Mild lower extremity edema present; no cyanosis CNS: Awake and oriented..  Still slow to respond.  No obvious focal deficits noted  lymph: No lymphadenopathy palpable Skin: No obvious ecchymosis/lesions  psych: Currently flat affect.  Not agitated  musculoskeletal: No obvious joint swelling/erythema  Data Reviewed: I have personally reviewed following labs and imaging studies  CBC: Recent Labs  Lab 01/29/24 0339 01/30/24 0830 01/30/24 1215 01/30/24 2027 01/31/24 0530 01/31/24 2300 02/01/24 0458  WBC 6.3 6.5 7.3  --  6.9  --  9.8  NEUTROABS  --  5.3  --   --  5.5  --  8.1*  HGB 10.4* 7.7* 7.8* 8.0* 7.5* 7.8* 9.2*  HCT 31.9* 23.5* 23.4* 24.7* 23.3* 24.1* 27.5*  MCV 87.9 92.2 91.8  --  89.3  --  87.3  PLT 285 251 257  --  246  --  291   Basic Metabolic Panel: Recent Labs  Lab 01/28/24 0400 01/29/24 0339 01/30/24 0535 01/31/24 0530 02/01/24 0458 02/01/24 0615  NA 139 141 137 136  --  130*  K 3.9 4.0 3.7 3.8  --  5.4*  CL 106 108 106 107  --  97*  CO2 24 21* 26 25  --  24  GLUCOSE 128* 176* 188* 177*  --  721*  BUN 31* 19 16 12   --  15  CREATININE 1.07 0.83 0.70 0.61  --  0.69  CALCIUM  7.6* 7.5* 7.4* 7.6*  --  7.9*  MG 2.4 2.2 1.9 1.8 2.3  --   PHOS 1.9* 3.4 2.9 2.8  --  5.3*   GFR: Estimated Creatinine Clearance: 94.3 mL/min (by C-G formula  based on SCr of 0.69 mg/dL). Liver Function Tests: Recent Labs  Lab 01/26/24 0439 01/27/24 0539 01/28/24 0400 01/29/24 0339 01/31/24 0530 02/01/24 0615  AST 14* 14* 14* 14* 13*  --   ALT 21 20 18 16 14   --   ALKPHOS 52 59 58 61 48  --   BILITOT 0.9 0.9 0.6 0.5 0.3  --   PROT 4.4* 5.4* 5.0* 4.7* 4.3*  --   ALBUMIN 1.7* 1.8* 1.8* 1.9* 1.8* 1.9*   No results for input(s): LIPASE, AMYLASE in the last 168 hours. No results for input(s): AMMONIA in the last 168 hours. Coagulation Profile: No results for input(s): INR, PROTIME in the last 168 hours. Cardiac Enzymes: No results for input(s): CKTOTAL, CKMB, CKMBINDEX, TROPONINI in the last 168 hours. BNP (last 3 results) Recent Labs    01/07/24 2301  PROBNP 15,245.0*   HbA1C: No results for input(s): HGBA1C in the last 72 hours. CBG: Recent Labs  Lab 01/31/24 1547 01/31/24 2055 02/01/24 0042 02/01/24 0505 02/01/24 0731  GLUCAP 162* 176* 165* 205* 172*   Lipid Profile: Recent Labs    01/31/24 0530  TRIG 101   Thyroid  Function Tests: No results for input(s): TSH, T4TOTAL, FREET4, T3FREE, THYROIDAB in the last 72 hours. Anemia Panel: No results for input(s): VITAMINB12, FOLATE, FERRITIN, TIBC, IRON, RETICCTPCT in the last 72 hours. Sepsis Labs: No results for input(s): PROCALCITON, LATICACIDVEN in the last 168 hours.   Recent Results (from the past 240 hours)  Culture, blood (routine x 2)     Status: None   Collection Time: 01/22/24 12:43 PM   Specimen: BLOOD RIGHT ARM  Result Value Ref Range Status   Specimen Description BLOOD RIGHT ARM  Final   Special Requests   Final    BOTTLES DRAWN AEROBIC AND ANAEROBIC Blood Culture adequate volume   Culture   Final    NO GROWTH 5 DAYS Performed at [facility], [address], [place], [place] [address]    Report Status 01/27/2024 FINAL  Final  Culture, blood (routine x 2)     Status: None  Collection Time: 01/22/24  12:48 PM   Specimen: BLOOD RIGHT HAND  Result Value Ref Range Status   Specimen Description BLOOD RIGHT HAND  Final   Special Requests   Final    BOTTLES DRAWN AEROBIC ONLY Blood Culture results may not be optimal due to an inadequate volume of blood received in culture bottles   Culture   Final    NO GROWTH 5 DAYS Performed at [facility], [address], [place], [place] [address]    Report Status 01/27/2024 FINAL  Final  C Difficile Quick Screen w PCR reflex     Status: None   Collection Time: 01/22/24  1:10 PM   Specimen: STOOL  Result Value Ref Range Status   C Diff antigen NEGATIVE NEGATIVE Final   C Diff toxin NEGATIVE NEGATIVE Final   C Diff interpretation No C. difficile detected.  Final    Comment: Performed at [facility], [address], [place], [place] [address]  MRSA Next Gen by PCR, Nasal     Status: None   Collection Time: 01/22/24  1:10 PM   Specimen: Nasal Mucosa; Nasal Swab  Result Value Ref Range Status   MRSA by PCR Next Gen NOT DETECTED NOT DETECTED Final    Comment: (NOTE) The GeneXpert MRSA Assay (FDA approved for NASAL specimens only), is one component of a comprehensive MRSA colonization surveillance program. It is not intended to diagnose MRSA infection nor to guide or monitor treatment for MRSA infections. Test performance is not FDA approved in patients less than 35 years old. Performed at [facility], [address], [place], [place] [address]          Radiology Studies: CT ANGIO GI BLEED Result Date: 01/30/2024 CLINICAL DATA:  Lower gastrointestinal bleeding. EXAM: CTA ABDOMEN AND PELVIS WITHOUT AND WITH CONTRAST TECHNIQUE: Multidetector CT imaging of the abdomen and pelvis was performed using the standard protocol during bolus administration of intravenous contrast. Multiplanar reconstructed images and MIPs were obtained and reviewed to evaluate the vascular anatomy. RADIATION DOSE REDUCTION: This exam was performed  according to the departmental dose-optimization program which includes automated exposure control, adjustment of the mA and/or kV according to patient size and/or use of iterative reconstruction technique. CONTRAST:  OMNIPAQUE  IOHEXOL  350 MG/ML SOLN COMPARISON:  January 22, 2024. FINDINGS: VASCULAR Aorta: Atherosclerosis of abdominal aorta is noted without aneurysm or dissection. 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 No thrombus is noted. SMA: Patent without evidence of aneurysm, dissection, vasculitis or significant stenosis. Renals: Bilateral renal arteries are patent without evidence of aneurysm, dissection, vasculitis, fibromuscular dysplasia or significant stenosis. IMA: Patent without evidence of aneurysm, dissection, vasculitis or significant stenosis. Inflow: Patent without evidence of aneurysm, dissection, vasculitis or significant stenosis. Proximal Outflow: Bilateral common femoral and visualized portions of the superficial and profunda femoral arteries are patent without evidence of aneurysm, dissection, vasculitis or significant stenosis. Veins: No obvious venous abnormality within the limitations of this arterial phase study. Review of the MIP images confirms the above findings. NON-VASCULAR Lower chest: Minimal bilateral pleural effusions are noted with adjacent subsegmental atelectasis. Hepatobiliary: No focal liver abnormality is seen. No gallstones, gallbladder wall thickening, or biliary dilatation. Pancreas: Unremarkable. No pancreatic ductal dilatation or surrounding inflammatory changes. Spleen: Normal in size without focal abnormality. Adrenals/Urinary Tract: Probable right adrenal adenoma. Left adrenal gland is unremarkable. Stable bilateral renal cystic abnormalities as noted on prior exam. No hydronephrosis or renal obstruction is  noted. Urinary bladder is unremarkable. Stomach/Bowel: The stomach is unremarkable. There is no evidence  of bowel obstruction. Sigmoid diverticulosis is noted. The appendix is unremarkable. Mild wall and fold thickening of ascending colon is noted suggesting possible infectious or inflammatory colitis. No definite evidence of contrast extravasation to suggest gastrointestinal bleeding. Rectal tube is noted. Lymphatic: No adenopathy is noted. 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 Other: Small fat containing periumbilical hernia.  No ascites. Musculoskeletal: No acute or significant osseous findings. 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 No definite evidence of contrast extravasation to suggest gastrointestinal bleeding. Mild wall and fold thickening of ascending colon is noted suggesting possible infectious or inflammatory colitis. Aortic Atherosclerosis (ICD10-I70.0). Electronically Signed   By: Lynwood Landy Raddle M.D.   On: 01/30/2024 14:25        Scheduled Meds:  sodium chloride    Intravenous Once   Chlorhexidine  Gluconate Cloth  6 each Topical Q0600   feeding supplement  237 mL Oral TID BM   Gerhardt's butt cream   Topical BID   insulin  aspart  0-9 Units Subcutaneous Q4H   metoprolol  succinate  12.5 mg Oral Daily   metroNIDAZOLE   500 mg Oral Q12H   pantoprazole  (PROTONIX ) IV  40 mg Intravenous Q12H   sodium chloride  flush  10-40 mL Intracatheter Q12H   spironolactone   12.5 mg Oral Daily   thiamine  (VITAMIN B1) injection  100 mg Intravenous Daily   Continuous Infusions:  amiodarone  30 mg/hr (02/01/24 0653)   cangrelor  (KENGREAL ) 50 mg in sodium chloride  0.9 % 250 mL (0.2 mg/mL) infusion 0.75 mcg/kg/min (02/01/24 0653)   TPN ADULT (ION) 80 mL/hr at 02/01/24 9346          Sophie Mao, MD [facility] 02/01/2024, 8:14 AM

## 2024-02-01 NOTE — Progress Notes (Signed)
 eLink Physician-Brief Progress Note Patient Name: Chase Combs DOB: Nov 10, 1945 MRN: [id number]   Date of Service  02/01/2024  HPI/Events of Note  Patient with non-cardiac reproducible chest wall pain, EKG without evidence of ischemia. He has received Tylenol  within the past hour without complete relief. No renal or bleeding issues.  eICU Interventions  Ibuprofen  600 mg PO x 1 ordered.        Dericka Ostenson U Yosiel Thieme 02/01/2024, 9:15 PM

## 2024-02-01 NOTE — Progress Notes (Signed)
 Progress Note  Patient Name: Chase Combs Date of Encounter: 02/01/2024 [facility] Cardiologist: Alm Clay, MD   Interval Summary    Frustrated about being in the hospital for so long, so epigastric pain this morning. Hoping for more solid food today. Had 3 BM last evening. Says RN noted last one was dark but no frank blood.   Vital Signs Vitals:   02/01/24 0500 02/01/24 0559 02/01/24 0730 02/01/24 0740  BP:  114/80 123/86   Pulse:   89 (!) 101  Resp:  (!) 25 (!) 22 (!) 30  Temp:  97.9 F (36.6 C) 98 F (36.7 C)   TempSrc:  Oral Oral   SpO2:   99% 98%  Weight: 95.9 kg     Height:        Intake/Output Summary (Last 24 hours) at 02/01/2024 0841 Last data filed at 02/01/2024 0653 Gross per 24 hour  Intake 4817.01 ml  Output 2550 ml  Net 2267.01 ml      02/01/2024    5:00 AM 01/31/2024    4:01 AM 01/30/2024    4:49 AM  Last 3 Weights  Weight (lbs) 211 lb 6.7 oz 213 lb 6.5 oz 211 lb 3.2 oz  Weight (kg) 95.9 kg 96.8 kg 95.8 kg      Telemetry/ECG   Atrial fibrillation 80-100s - Personally Reviewed  Physical Exam  GEN: No acute distress.   Neck: No JVD Cardiac: Irreg Irreg, no murmurs, rubs, or gallops.  Respiratory: Crackles on the left  GI: Soft, non-distended  MS: No edema  Assessment & Plan   78 y.o. Caucasian male patient with coronary artery disease and history of angioplasty to proximal/ostial LAD on 01/17/2024, has prior stents to RCA and ramus intermediate which had mild disease, admitted to the hospital on 01/22/2024 with ischemic colitis.  Patient n.p.o. status, started on antiplatelet therapy with cangrelor .    CAD -- recent cardiac cath with PCI/DES to the o/pLAD on 01/17/2024. Was on DAPT with ASA/Brilinta  PTA -- has been treated with cangrelor  since admission  -- review of notes suggest possible transition to aggrastat, but given issues with GI bleed and quick half life he was continued on cangrelor   -- after discussion with attending/GI  yesterday will plan to transition to plavix  this morning with stable Hgb. Will have to forego ASA given the need for Eliquis as noted below     Ischemic/Inflammatory Colitis  Acute GI bleed -- gen surgery has signed off -- Hgb dropped from 10.4 (9/6) to 7.5 -- seen by GI and CT angio negative for acute bleed, mild wall and fold thickening of ascending colon for possible infectious/inflammatory colitis  -- rectal tube removed  without further bleeding since  -- s/p 2 units PRBCs, Hgb up 9.2   New onset atrial fibrillation  -- continues in atrial fib, rates 80-100s -- on IV amiodarone , difficult to anticoagulate given his anemia. Will ultimately need anticoagulation, but defer for at least several days, maybe need to be started outpatient.  -- switch to PO amiodarone  200mg  BID today x1 week then 200mg  daily  -- continue Toprol  12.5mg  daily   HFrEF -- echo 01/13/2024 with LVEF of 25-30%, g1DD, moderately reduced RV, moderate LAE, mild MR -- s/p IV lasix  after blood transfusion, UOP 2.5L. Still feels short of breath this morning. Re-dose IV lasix  40mg  x1 -- GDMT resumed yesterday with Toprol  12.5mg  daily, spiro 12.5mg  daily. Glucose on labs of 721, suspect lab error? Repeat pending. Holding on addition  of jardiance /entresto  for now. May be able to optimize further inpatient   For questions or updates, please contact [facility] Please consult [email or web address] for contact info under       Signed, Manuelita Rummer, NP

## 2024-02-01 NOTE — Telephone Encounter (Signed)
 Patient Product/process development scientist completed.    The patient is insured through [facility]. Patient has Medicare and is not eligible for a copay card, but may be able to apply for patient assistance or Medicare RX Payment Plan (Patient Must reach out to their plan, if eligible for payment plan), if available.    Ran test claim for Jardiance 10 mg and the current 30 day co-pay is $47.00.   This test claim was processed through [facility]- copay amounts may vary at other pharmacies due to pharmacy/plan contracts, or as the patient moves through the different stages of their insurance plan.     Roland Earl, CPHT Pharmacy Technician III Certified Patient Advocate [facility] Patient Advocate Team Direct Number: [phone]  Fax: [phone]

## 2024-02-01 NOTE — Progress Notes (Signed)
 PHARMACY - TOTAL PARENTERAL NUTRITION CONSULT NOTE  Indication: Prolonged ileus  Patient Measurements: Height: 6' (182.9 cm) Weight: 95.9 kg (211 lb 6.7 oz) IBW/kg (Calculated) : 77.6 TPN AdjBW (KG): 91.1 Body mass index is 28.67 kg/m.  Assessment:  89 YOM presented on 8/30 with abdominal pain, found to have adynamic ileus and possible ischemic colitis.  Patient stated his last meal was on 8/30 and was likely losing weight PTA, but unable to quantify.  He was started on a clear liquid diet on 9/3 PM and hasn't been able to advance.  Pharmacy consulted to manage TPN.  He is at risk for refeeding syndrome.   9/9 AM update: He will receive lasix  40 mg IV x2 doses today [9/9]  Glucose / Insulin : no hx DM - CBGs variable (148-205s) Used 12 units SSI charted in the past 24 hrs Electrolytes: Na 136, K 3.8 (goal >/= 4, has required a significant amount of KCL runs since admission, up to 20 runs on day one), Cl 103, Mag 2.3, Phos 3.6 , Ca 8.1 [CoCa 9.78] Renal: SCr 0.64, BUN 16 Hepatic: AST 13 / ALT, Alk Phos, Tbili wnl, TG 101, albumin 1.8 Intake / Output; MIVF: UOP 1.11 mL/kg/hr, stool 3+ unmeasured GI Imaging: 9/5 KUB: diffuse gaseous distension of entire colon  GI Surgeries / Procedures: none since TPN initiation   Central access: PICC placed 01/24/24 TPN start date: 01/28/24  Nutritional Goals: Goal TPN rate is 80 mL/hr (provides 100g AA and 1965 kCal per day)  RD Estimated Needs Total Energy Estimated Needs: 1900-2100 kcals Total Protein Estimated Needs: 95-110g Total Fluid Estimated Needs: 1.9-2.1L/day  Current Nutrition:  TPN CLD started 9/3  Plan:  Continue TPN to 80 mL/hr at 1800 (goal rate 80 ml/hr) to provide 100% of estimated needs. Electrolytes in TPN: Na 25 mEq/L, K 50 mEq/L, Ca 4 mEq/L, Mg 8 mEq/L, Phos 15 mmol/L , Cl:Ac 1:2  Continue standard MVI and trace elements to TPN Continue SSI sensitive Q4H  K-Dur 40 meq po x1 Thiamine  100mg  IV daily x 5 (last dose  9/9) Monitor TPN labs on Mon/Thurs; PRN F/up on advancing diet and discontinuing TPN when clinically appropriate    Benedetta Heath BS, PharmD, BCPS Clinical Pharmacist 02/01/2024 6:47 AM  Contact: [phone] after 3 PM

## 2024-02-01 NOTE — Progress Notes (Signed)
 TRH night cross cover note:   This patient's updated hemoglobin tonight is 7.8, which has increased from 7.5 following interval transfusion of 1 unit PRBC.  His most recent bowel movement this evening, was reported to be associate with small amount of bright red blood.  In terms of his vital signs, he is mildly tachycardic, but with normotensive blood pressures, most recent blood pressure noted to be 116/74.  He is afebrile, maintaining oxygen saturations in the high 90s on room air.  In the setting of a less than anticipated interval increase in hemoglobin level and response to interval transfusion of 1 unit PRBC as well as the presence of tachycardia and evidence of a small amount of bright red blood on most recent bowel movement, will proceed with transfusion of an additional 1 unit PRBC over 4 hours.  I have also placed orders for a repeat hemoglobin to be checked following completion of transfusion of this 1 unit PBC as well as for the patient to receive a dose of Lasix  40 mg IV x 1 following completion of this transfusion.    Eva Pore, DO Hospitalist

## 2024-02-01 NOTE — Consult Note (Addendum)
 Advanced Heart Failure Team Consult Note   Primary Physician: Wonda Worth SQUIBB, PA Cardiologist:  Alm Clay, MD  Reason for Consultation: A/C HFrEF, CAD  HPI:    Chase Combs is seen today for evaluation of A/C HFrEF/AFib RVR/CAD at the request of Dr Link.   Chase Combs is a 78 year old with a history of ICM, and chronic biventricular HFrEF.     Echo 01/13/24 LVEF 20-25% RV moderately reduced. August 25th had NSTEMI with DES to LAD. Discharged to home a few days later on DAPT.   Readmitted 01/22/24 with abdominal pain./nausea/vomiting. Hospital complicated by ischemic colitis and septic shock/GI bleed. He has been on  Cangrelor  since admit. Treated with antibiotics. GI/General surgery consulted. Briefly on Norepi. Gradually improved and transferred to progressive care. Over the weekend he developed GI bleed.Aspirin  stopped. Placed on IV PPI.  GI bleeding resolved 9/7. 9/8 Hgb 7.5. Given 2UPRBCs. Hgb with appropriate rise.   Today was loaded with plavix . He later developed acute shoulder pain radiating to his chest. Acutely short of breath at rest. Taken urgently to the cath lab.  Cath PTCA ostial prox LAD prox LAD. Given heparin , 325 aspirin  + IV Cangrelor . Transferred to ICU.    Home Medications Prior to Admission medications   Medication Sig Start Date End Date Taking? Authorizing Provider  amiodarone  (PACERONE ) 200 MG tablet Take 1 tablet (200 mg total) by mouth daily. 01/19/24  Yes Fairy Frames, MD  aspirin  EC 81 MG tablet Take 81 mg by mouth 2 (two) times daily.   Yes [provider]  CALCIUM  PO Take 1 tablet by mouth daily.   Yes [provider]  clotrimazole-betamethasone (LOTRISONE) cream Apply 1 Application topically 2 (two) times daily as needed (skin irritation).   Yes [provider]  Cyanocobalamin  (VITAMIN B-12 PO) Take 1 tablet by mouth daily.   Yes [provider]  furosemide  (LASIX ) 20 MG tablet Take 1 tablet (20 mg  total) by mouth daily. 01/19/24  Yes Croitoru, Mihai, MD  metoprolol  succinate (TOPROL -XL) 25 MG 24 hr tablet Take 1 tablet (25 mg total) by mouth daily. 01/19/24  Yes Fairy Frames, MD  Multiple Vitamins-Minerals (MULTIVITAMIN MEN 50+) TABS Take 1 tablet by mouth daily.   Yes [provider]  Probiotic Product (PROBIOTIC PO) Take 1 capsule by mouth daily.   Yes [provider]  rosuvastatin  (CRESTOR ) 20 MG tablet TAKE 1 TABLET(20 MG) BY MOUTH DAILY 11/29/23  Yes Clay Alm ORN, MD  sacubitril -valsartan  (ENTRESTO ) 49-51 MG Take 1 tablet by mouth 2 (two) times daily. 01/19/24  Yes Fairy Frames, MD  spironolactone  (ALDACTONE ) 25 MG tablet Take 0.5 tablets (12.5 mg total) by mouth daily. 01/19/24  Yes Fairy Frames, MD  tamsulosin  (FLOMAX ) 0.4 MG CAPS capsule Take 1 capsule (0.4 mg total) by mouth daily after supper. 11/17/21  Yes Patrcia Cough, MD  ticagrelor  (BRILINTA ) 90 MG TABS tablet Take 1 tablet (90 mg total) by mouth 2 (two) times daily. 01/19/24  Yes Fairy Frames, MD  nitroGLYCERIN  (NITROSTAT ) 0.4 MG SL tablet Place 1 tablet (0.4 mg total) under the tongue every 5 (five) minutes as needed for chest pain. Patient not taking: Reported on 01/23/2024 01/19/24 01/18/25  Fairy Frames, MD  ondansetron  (ZOFRAN -ODT) 4 MG disintegrating tablet Take 1 tablet (4 mg total) by mouth every 8 (eight) hours as needed for nausea or vomiting. Patient not taking: Reported on 01/23/2024 01/21/24   Clay Alm ORN, MD    Past Medical History: Past  Medical History:  Diagnosis Date   Abdominal hernia    per pt   Arthritis    hands   Benign localized prostatic hyperplasia with lower urinary tract symptoms (LUTS)    CAD (coronary artery disease) 05/2004   cardiologist--- dr anner;  positive stress test , had outpt cardiac cath 05-27-2004 stenosis RI;  06-18-2004 cath w/ PCI and BMS x1 to pRI;   STEMI 02-16-2009  s/p cath w/ PCI thrombectomy for total occluded RCA , BMS x1 to pRCA;   nuclear stress test 03-15-2012 low risk no ischemia but sig inferior-inferolateral infarct , ef 51%   Dyslipidemia    Essential hypertension    Heart murmur    History of COVID-19 05/2020   per pt mild symptoms that resolved   History of ST elevation myocardial infarction (STEMI) 02/16/2009   inferior STEMI   cath w/ pci w/ BMS to pRCA   History of urinary retention    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[facility]) 04/2021   urologist--- dr borden/ radiation oncology-- dr patrcia;  dx 12/ 2022,  Gleason 4+5, PSA 24.6, volume 94.6cc;  plan to start IMRT   OSA (obstructive sleep apnea)    per pt dx approx 2008, no cpap intolerant   PONV (postoperative nausea and vomiting)    Pre-diabetes    S/p bare metal coronary artery stent    01/ 2006  x1 BMS to pRI (CoStar study stent);  and 09/ 2010  x1 BMS to [facility]    Past Surgical History: Past Surgical History:  Procedure Laterality Date   CARDIAC CATHETERIZATION  05/27/2004   outpatient by dr burnard [facility] cardiology) for positive stress test;  80% stensis pRI   CORONARY ANGIOPLASTY WITH STENT PLACEMENT  06/18/2004   @[facility]  by Dr Lavon;  PCI w/ BMS to pRI  (CoStar study stent - 2.5 x 16)   CORONARY ANGIOPLASTY WITH STENT PLACEMENT  02/16/2009   @[facility]  by dr verlin;   Inferior STEMI:  PCI w/ thrombectomy total occluded RCA, BMS x1 to pRCA (Vision BMS 3.5 x 23 -> 4.0 mm), residual 40-50% pLAD, ef 45-50%   CORONARY PRESSURE/FFR STUDY N/A 01/17/2024   Procedure: CORONARY PRESSURE/FFR STUDY;  Surgeon: Mady Bruckner, MD;  Location: [facility];  Service: Cardiovascular;  Laterality: N/A;   CORONARY STENT INTERVENTION N/A 01/17/2024   Procedure: CORONARY STENT INTERVENTION;  Surgeon: Mady Bruckner, MD;  Location: [facility];  Service: Cardiovascular;  Laterality: N/A;   CORONARY ULTRASOUND/IVUS N/A 01/17/2024   Procedure: Coronary Ultrasound/IVUS;  Surgeon: Mady Bruckner, MD;  Location: [facility];  Service: Cardiovascular;   Laterality: N/A;   FLEXIBLE SIGMOIDOSCOPY N/A 10/20/2023   Procedure: KINGSTON SIDE;  Surgeon: Legrand Victory LITTIE DOUGLAS, MD;  Location: [facility] ENDOS[facility];  Service: Gastroenterology;  Laterality: N/A;   GOLD SEED IMPLANT N/A 09/19/2021   Procedure: GOLD SEED IMPLANT;  Surgeon: Cam Morene ORN, MD;  Location: [facility];  Service: Urology;  Laterality: N/A;  ONLY NEEDS 30 MIN FOR ALL   INGUINAL HERNIA REPAIR Right 09/02/1999   @[facility] ;   recurrent repair right inguinal hernia;   previous repair approx 1990s   INGUINAL HERNIA REPAIR Left    1990s   KNEE ARTHROSCOPY W/ MENISCAL REPAIR Right 12/18/2005   @[facility]    LUMBAR DISC SURGERY  1982   L5--S1   RIGHT/LEFT HEART CATH AND CORONARY ANGIOGRAPHY N/A 01/17/2024   Procedure: RIGHT/LEFT HEART CATH AND CORONARY ANGIOGRAPHY;  Surgeon: Mady Bruckner, MD;  Location: [facility] CV  [facility];  Service: Cardiovascular;  Laterality: N/A;   SPACE OAR INSTILLATION N/A 09/19/2021   Procedure: SPACE OAR INSTILLATION;  Surgeon: Cam Morene ORN, MD;  Location: [facility];  Service: Urology;  Laterality: N/A;   TOTAL KNEE ARTHROPLASTY Left 10/01/2023   Procedure: ARTHROPLASTY, KNEE, TOTAL;  Surgeon: Kay Kemps, MD;  Location: [facility];  Service: Orthopedics;  Laterality: Left;    Family History: Family History  Problem Relation Age of Onset   Lung cancer Mother    Colon cancer Neg Hx    Esophageal cancer Neg Hx     Social History: Social History   Socioeconomic History   Marital status: Married    Spouse name: [name]   Number of children: 3   Years of education: Not on file   Highest education level: Not on file  Occupational History   Not on file  Tobacco Use   Smoking status: Never   Smokeless tobacco: Never  Vaping Use   Vaping status: Never Used  Substance and Sexual Activity   Alcohol use: No    Alcohol/week: 0.0 standard drinks of alcohol   Drug use: Never   Sexual activity: Not on file  Other Topics Concern    Not on file  Social History Narrative   He is married, father of 3, grandfather of 5. Never smoked. Does not drink.    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    He does lots of lifting, pulling with ropes, using upper extremities.       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   Social Drivers of Corporate investment banker Strain: Not on file  Food Insecurity: No Food Insecurity (01/27/2024)   Hunger Vital Sign    Worried About Running Out of Food in the Last Year: Never true    Ran Out of Food in the Last Year: Never true  Transportation Needs: No Transportation Needs (01/27/2024)   PRAPARE - Administrator, Civil Service (Medical): No    Lack of Transportation (Non-Medical): No  Physical Activity: Not on file  Stress: Not on file  Social Connections: Socially Integrated (01/27/2024)   Social Connection and Isolation Panel    Frequency of Communication with Friends and Family: More than three times a week    Frequency of Social Gatherings with Friends and Family: Once a week    Attends Religious Services: More than 4 times per year    Active Member of [employer] or Organizations: No    Attends Engineer, structural: More than 4 times per year    Marital Status: Married    Allergies:  Allergies  Allergen Reactions   Codeine Nausea And Vomiting   Latex Rash    Oral blisters when used at dentist    Objective:    Vital Signs:   Temp:  [97.5 F (36.4 C)-98.2 F (36.8 C)] 98.2 F (36.8 C) (09/09 1125) Pulse Rate:  [31-145] 60 (09/09 1250) Resp:  [19-32] 26 (09/09 1250) BP: (114-129)/(62-86) 129/62 (09/09 1125) SpO2:  [91 %-99 %] 98 % (09/09 1250) Weight:  [95.9 kg] 95.9 kg (09/09 0500) Last BM Date : 02/01/24  Weight change: Filed Weights   01/30/24  0449 01/31/24 0401 02/01/24 0500  Weight: 95.8  kg 96.8 kg 95.9 kg    Intake/Output:   Intake/Output Summary (Last 24 hours) at 02/01/2024 1408 Last data filed at 02/01/2024 0653 Gross per 24 hour  Intake 3777.01 ml  Output 2550 ml  Net 1227.01 ml      Physical Exam   General:   No resp difficulty Neck: JVP 10-11  Cor: Tachy Irregular rate & rhythm.  Lungs: clear Abdomen: soft, nontender, nondistended.  Extremities: R and LLE 2+  edema + SCDs LUPE PICC  Neuro: alert & oriented x3   Telemetry   A Fib RVR 100-120s   EKG      Labs   Basic Metabolic Panel: Recent Labs  Lab 01/28/24 0400 01/29/24 0339 01/30/24 0535 01/31/24 0530 02/01/24 0458 02/01/24 0615 02/01/24 0830  NA 139 141 137 136  --  130* 136  K 3.9 4.0 3.7 3.8  --  5.4* 3.8  CL 106 108 106 107  --  97* 103  CO2 24 21* 26 25  --  24 24  GLUCOSE 128* 176* 188* 177*  --  721* 178*  BUN 31* 19 16 12   --  15 16  CREATININE 1.07 0.83 0.70 0.61  --  0.69 0.64  CALCIUM  7.6* 7.5* 7.4* 7.6*  --  7.9* 8.1*  MG 2.4 2.2 1.9 1.8 2.3  --   --   PHOS 1.9* 3.4 2.9 2.8  --  5.3* 3.6    Liver Function Tests: Recent Labs  Lab 01/26/24 0439 01/27/24 0539 01/28/24 0400 01/29/24 0339 01/31/24 0530 02/01/24 0615  AST 14* 14* 14* 14* 13*  --   ALT 21 20 18 16 14   --   ALKPHOS 52 59 58 61 48  --   BILITOT 0.9 0.9 0.6 0.5 0.3  --   PROT 4.4* 5.4* 5.0* 4.7* 4.3*  --   ALBUMIN 1.7* 1.8* 1.8* 1.9* 1.8* 1.9*   No results for input(s): LIPASE, AMYLASE in the last 168 hours. No results for input(s): AMMONIA in the last 168 hours.  CBC: Recent Labs  Lab 01/29/24 0339 01/30/24 0830 01/30/24 1215 01/30/24 2027 01/31/24 0530 01/31/24 2300 02/01/24 0458  WBC 6.3 6.5 7.3  --  6.9  --  9.8  NEUTROABS  --  5.3  --   --  5.5  --  8.1*  HGB 10.4* 7.7* 7.8* 8.0* 7.5* 7.8* 9.2*  HCT 31.9* 23.5* 23.4* 24.7* 23.3* 24.1* 27.5*  MCV 87.9 92.2 91.8  --  89.3  --  87.3  PLT 285 251 257  --  246  --  291    Cardiac  Enzymes: No results for input(s): CKTOTAL, CKMB, CKMBINDEX, TROPONINI in the last 168 hours.  BNP: BNP (last 3 results) Recent Labs    01/11/24 0539 01/13/24 0537 01/22/24 1243  BNP 842.8* 1,747.3* 220.4*    ProBNP (last 3 results) Recent Labs    01/07/24 2301  PROBNP 15,245.0*     CBG: Recent Labs  Lab 01/31/24 2055 02/01/24 0042 02/01/24 0505 02/01/24 0731 02/01/24 1122  GLUCAP 176* 165* 205* 172* 174*    Coagulation Studies: No results for input(s): LABPROT, INR in the last 72 hours.   Imaging   No results found.   Medications:     Current Medications:  [MAR Hold] sodium chloride    Intravenous Once   [MAR Hold] amiodarone   200 mg Oral BID   [MAR Hold] Chlorhexidine  Gluconate Cloth  6 each Topical Q0600   [MAR Hold] clopidogrel   75 mg Oral Daily   [  MAR Hold] empagliflozin   10 mg Oral Daily   [MAR Hold] feeding supplement  237 mL Oral TID BM   [MAR Hold] Gerhardt's butt cream   Topical BID   [MAR Hold]  HYDROmorphone  (DILAUDID ) injection  0.5 mg Intravenous Once   [MAR Hold] insulin  aspart  0-9 Units Subcutaneous Q4H   [MAR Hold] metoprolol  succinate  12.5 mg Oral Daily   [MAR Hold] metroNIDAZOLE   500 mg Oral Q12H   [MAR Hold] pantoprazole  (PROTONIX ) IV  40 mg Intravenous Q12H   [MAR Hold] sodium chloride  flush  10-40 mL Intracatheter Q12H   [MAR Hold] spironolactone   12.5 mg Oral Daily    Infusions:  [MAR Hold] ondansetron  (ZOFRAN ) IV     TPN ADULT (ION) 80 mL/hr at 02/01/24 0653   TPN ADULT (ION)        Patient Profile   Chase Dai is a 78 year old with a history of ICM, and chronic biventricular HFrEF.     Readmitted 01/22/24 with abdominal pain./nausea/vomiting. Hospital complicated by ischemic colitis and septic shock/GI bleed.   Assessment/Plan   CAD, Instent Stenosis 01/17/24- PCI/Des LAD  Cangrelor  stopped earlier today. Plavix  load. Later developed acute chest pain. Returned urgent to cath lab. PTCA LAD. Lactic acid 0.7   -Continue high intensity statin -Continue Cangrelor  overnight.  - Off aspirin  with GI bleed.   New A fib RVR - Uncontrolled rate.  - Stop oral amio. Restart amio drip.  - Add SCDs. Anticipate starting eliquis in few days if no further bleeding occurs.   A/C HFrEF, ICM   01/13/24 Echo EF  20-25% RV moderately reduced. Repeat ECHO   - Volume status elevated. Give 40 mg IV lasix  now.  - Set up CVP. Check CO-OX  -Continue jardiance  10 mg daily -Continue spironolactone  25 mg dialy  -Follow renal function.   GI Bleed -Possible bleeding from rectal tube. Off aspirin   -Transfused 9/8 with appropriate rise.  -Continue IV Protonix .   Septic Shock  On admit, ischemic colitis. Treated with antibiotics. Resolved. GI/Gen Surgery signed off.   DNR   CVP 17-18  CO-oX 44%. Start milrinone  0.25 mcg   Length of Stay: 10  [name], NP  02/01/2024, 2:08 PM    Advanced Heart Failure Team Pager [phone] (M-F; 7a - 5p)  Please contact [facility] for night-coverage after hours (4p -7a ) and weekends on [email or web address]

## 2024-02-02 ENCOUNTER — Inpatient Hospital Stay (HOSPITAL_COMMUNITY)

## 2024-02-02 ENCOUNTER — Encounter (HOSPITAL_COMMUNITY)
Admission: EM | Disposition: A | Discharge status: Home Health | Payer: Self-pay | Source: Home / Self Care | Attending: Internal Medicine

## 2024-02-02 ENCOUNTER — Encounter (HOSPITAL_COMMUNITY): Payer: Self-pay | Admitting: Internal Medicine

## 2024-02-02 DIAGNOSIS — I2510 Atherosclerotic heart disease of native coronary artery without angina pectoris: Secondary | ICD-10-CM

## 2024-02-02 DIAGNOSIS — D62 Acute posthemorrhagic anemia: Secondary | ICD-10-CM | POA: Diagnosis not present

## 2024-02-02 DIAGNOSIS — R579 Shock, unspecified: Secondary | ICD-10-CM | POA: Diagnosis not present

## 2024-02-02 DIAGNOSIS — K2100 Gastro-esophageal reflux disease with esophagitis, without bleeding: Secondary | ICD-10-CM

## 2024-02-02 DIAGNOSIS — I2101 ST elevation (STEMI) myocardial infarction involving left main coronary artery: Secondary | ICD-10-CM

## 2024-02-02 DIAGNOSIS — K626 Ulcer of anus and rectum: Secondary | ICD-10-CM | POA: Diagnosis not present

## 2024-02-02 DIAGNOSIS — K5730 Diverticulosis of large intestine without perforation or abscess without bleeding: Secondary | ICD-10-CM

## 2024-02-02 DIAGNOSIS — I5022 Chronic systolic (congestive) heart failure: Secondary | ICD-10-CM | POA: Diagnosis not present

## 2024-02-02 DIAGNOSIS — I5023 Acute on chronic systolic (congestive) heart failure: Secondary | ICD-10-CM

## 2024-02-02 DIAGNOSIS — I2102 ST elevation (STEMI) myocardial infarction involving left anterior descending coronary artery: Secondary | ICD-10-CM | POA: Diagnosis not present

## 2024-02-02 DIAGNOSIS — R1013 Epigastric pain: Secondary | ICD-10-CM

## 2024-02-02 DIAGNOSIS — L8990 Pressure ulcer of unspecified site, unspecified stage: Secondary | ICD-10-CM | POA: Diagnosis present

## 2024-02-02 DIAGNOSIS — K559 Vascular disorder of intestine, unspecified: Secondary | ICD-10-CM | POA: Diagnosis not present

## 2024-02-02 DIAGNOSIS — I5020 Unspecified systolic (congestive) heart failure: Secondary | ICD-10-CM | POA: Diagnosis not present

## 2024-02-02 DIAGNOSIS — I4819 Other persistent atrial fibrillation: Secondary | ICD-10-CM | POA: Diagnosis not present

## 2024-02-02 HISTORY — PX: FLEXIBLE SIGMOIDOSCOPY: SHX5431

## 2024-02-02 HISTORY — PX: ESOPHAGOGASTRODUODENOSCOPY: SHX5428

## 2024-02-02 LAB — TYPE AND SCREEN
ABO/RH(D): O POS
Antibody Screen: NEGATIVE
Unit division: 0
Unit division: 0
Unit division: 0
Unit division: 0

## 2024-02-02 LAB — CBC
HCT: 22.2 % — ABNORMAL LOW (ref 39.0–52.0)
HCT: 24.7 % — ABNORMAL LOW (ref 39.0–52.0)
HCT: 28.9 % — ABNORMAL LOW (ref 39.0–52.0)
Hemoglobin: 7.3 g/dL — ABNORMAL LOW (ref 13.0–17.0)
Hemoglobin: 7.9 g/dL — ABNORMAL LOW (ref 13.0–17.0)
Hemoglobin: 9.5 g/dL — ABNORMAL LOW (ref 13.0–17.0)
MCH: 29.3 pg (ref 26.0–34.0)
MCH: 29.9 pg (ref 26.0–34.0)
MCH: 30.8 pg (ref 26.0–34.0)
MCHC: 32 g/dL (ref 30.0–36.0)
MCHC: 32.9 g/dL (ref 30.0–36.0)
MCHC: 32.9 g/dL (ref 30.0–36.0)
MCV: 89.2 fL (ref 80.0–100.0)
MCV: 93.6 fL (ref 80.0–100.0)
MCV: 93.7 fL (ref 80.0–100.0)
Platelets: 251 K/uL (ref 150–400)
Platelets: 266 K/uL (ref 150–400)
Platelets: 294 K/uL (ref 150–400)
RBC: 2.37 MIL/uL — ABNORMAL LOW (ref 4.22–5.81)
RBC: 2.64 MIL/uL — ABNORMAL LOW (ref 4.22–5.81)
RBC: 3.24 MIL/uL — ABNORMAL LOW (ref 4.22–5.81)
RDW: 18.5 % — ABNORMAL HIGH (ref 11.5–15.5)
RDW: 19.6 % — ABNORMAL HIGH (ref 11.5–15.5)
RDW: 19.6 % — ABNORMAL HIGH (ref 11.5–15.5)
WBC: 10.9 K/uL — ABNORMAL HIGH (ref 4.0–10.5)
WBC: 12.6 K/uL — ABNORMAL HIGH (ref 4.0–10.5)
WBC: 12.6 K/uL — ABNORMAL HIGH (ref 4.0–10.5)
nRBC: 0.2 % (ref 0.0–0.2)
nRBC: 0.3 % — ABNORMAL HIGH (ref 0.0–0.2)
nRBC: 0.4 % — ABNORMAL HIGH (ref 0.0–0.2)

## 2024-02-02 LAB — COOXEMETRY PANEL
Carboxyhemoglobin: 1.3 % (ref 0.5–1.5)
Carboxyhemoglobin: 1.5 % (ref 0.5–1.5)
Methemoglobin: <0.7 % (ref 0.0–1.5)
Methemoglobin: <0.7 % (ref 0.0–1.5)
O2 Saturation: 50.4 %
O2 Saturation: 51.7 %
Total hemoglobin: 9.9 g/dL — ABNORMAL LOW (ref 12.0–16.0)
Total hemoglobin: 8.1 g/dL — ABNORMAL LOW (ref 12.0–16.0)

## 2024-02-02 LAB — COMPREHENSIVE METABOLIC PANEL WITH GFR
ALT: 30 U/L (ref 0–44)
AST: 165 U/L — ABNORMAL HIGH (ref 15–41)
Albumin: 2 g/dL — ABNORMAL LOW (ref 3.5–5.0)
Alkaline Phosphatase: 57 U/L (ref 38–126)
Anion gap: 11 (ref 5–15)
BUN: 26 mg/dL — ABNORMAL HIGH (ref 8–23)
CO2: 25 mmol/L (ref 22–32)
Calcium: 7.8 mg/dL — ABNORMAL LOW (ref 8.9–10.3)
Chloride: 102 mmol/L (ref 98–111)
Creatinine, Ser: 0.97 mg/dL (ref 0.61–1.24)
GFR, Estimated: >60 mL/min (ref >60)
Glucose, Bld: 201 mg/dL — ABNORMAL HIGH (ref 70–99)
Potassium: 4.3 mmol/L (ref 3.5–5.1)
Sodium: 138 mmol/L (ref 135–145)
Total Bilirubin: 0.4 mg/dL (ref 0.0–1.2)
Total Protein: 4.5 g/dL — ABNORMAL LOW (ref 6.5–8.1)

## 2024-02-02 LAB — BPAM RBC
Blood Product Expiration Date: 202509292359
Blood Product Expiration Date: 202510042359
Blood Product Expiration Date: 202510042359
Blood Product Expiration Date: 202509152359
ISSUE DATE / TIME: 202509090147
ISSUE DATE / TIME: 202509081416
Unit Type and Rh: 5100
Unit Type and Rh: 5100
Unit Type and Rh: 5100
Unit Type and Rh: 5100

## 2024-02-02 LAB — ECHOCARDIOGRAM COMPLETE
AR max vel: 2.12 cm2
AV Area VTI: 2.17 cm2
AV Area mean vel: 1.99 cm2
AV Mean grad: 5 mmHg
AV Peak grad: 8 mmHg
Ao pk vel: 1.41 m/s
Area-P 1/2: 2.93 cm2
Height: 72 in
S' Lateral: 5.7 cm
Weight: 3615.54 [oz_av]

## 2024-02-02 LAB — GLUCOSE, CAPILLARY
Glucose-Capillary: 114 mg/dL — ABNORMAL HIGH (ref 70–99)
Glucose-Capillary: 176 mg/dL — ABNORMAL HIGH (ref 70–99)
Glucose-Capillary: 181 mg/dL — ABNORMAL HIGH (ref 70–99)
Glucose-Capillary: 190 mg/dL — ABNORMAL HIGH (ref 70–99)
Glucose-Capillary: 193 mg/dL — ABNORMAL HIGH (ref 70–99)
Glucose-Capillary: 202 mg/dL — ABNORMAL HIGH (ref 70–99)
Glucose-Capillary: 207 mg/dL — ABNORMAL HIGH (ref 70–99)

## 2024-02-02 LAB — MAGNESIUM: Magnesium: 1.8 mg/dL (ref 1.7–2.4)

## 2024-02-02 LAB — PREPARE RBC (CROSSMATCH)

## 2024-02-02 LAB — CG4 I-STAT (LACTIC ACID): Lactic Acid, Venous: 0.8 mmol/L (ref 0.5–1.9)

## 2024-02-02 SURGERY — EGD (ESOPHAGOGASTRODUODENOSCOPY)
Anesthesia: Monitor Anesthesia Care

## 2024-02-02 MED ORDER — ALUM & MAG HYDROXIDE-SIMETH 200-200-20 MG/5ML PO SUSP: 30.0000 mL | Freq: Once | ORAL | Status: DC

## 2024-02-02 MED ORDER — SODIUM CHLORIDE 0.9 % IV SOLN: INTRAVENOUS | Status: DC | PRN

## 2024-02-02 MED ORDER — SODIUM CHLORIDE 0.9% IV SOLUTION: Freq: Once | INTRAVENOUS | Status: AC

## 2024-02-02 MED ORDER — PROPOFOL 10 MG/ML IV BOLUS
INTRAVENOUS | Status: DC | PRN
  Administered 2024-02-02: 20 mg via INTRAVENOUS
  Administered 2024-02-02: 40 ug/kg/min via INTRAVENOUS

## 2024-02-02 MED ORDER — SUCRALFATE 1 G PO TABS
1.0000 g | ORAL_TABLET | Freq: Three times a day (TID) | ORAL | Status: DC
  Administered 2024-02-02 – 2024-02-03 (×3): 1 g via ORAL
  Filled 2024-02-02 (×6): qty 1

## 2024-02-02 MED ORDER — NYSTATIN 100000 UNIT/GM EX POWD
Freq: Two times a day (BID) | CUTANEOUS | Status: DC
  Administered 2024-02-03 – 2024-02-16 (×3): 1 via TOPICAL
  Filled 2024-02-02 (×2): qty 15

## 2024-02-02 MED ORDER — DILTIAZEM LOAD VIA INFUSION
5.0000 mg | Freq: Once | INTRAVENOUS | Status: AC
  Administered 2024-02-02: 5 mg via INTRAVENOUS
  Filled 2024-02-02: qty 5

## 2024-02-02 MED ORDER — SODIUM CHLORIDE 0.9 % IV SOLN: INTRAVENOUS | Status: DC

## 2024-02-02 MED ORDER — DILTIAZEM HCL-DEXTROSE 125-5 MG/125ML-% IV SOLN (PREMIX)
5.0000 mg/h | INTRAVENOUS | Status: DC
  Administered 2024-02-02: 5 mg/h via INTRAVENOUS
  Filled 2024-02-02: qty 125

## 2024-02-02 MED ORDER — FAMOTIDINE 20 MG PO TABS: 20.0000 mg | ORAL_TABLET | Freq: Two times a day (BID) | ORAL | Status: DC

## 2024-02-02 MED ORDER — EPINEPHRINE 1 MG/10ML IJ SOSY
PREFILLED_SYRINGE | INTRAMUSCULAR | Status: DC | PRN
  Administered 2024-02-02: 5 ug via INTRAVENOUS
  Administered 2024-02-02 (×2): 10 ug via INTRAVENOUS

## 2024-02-02 MED ORDER — CALCIUM GLUCONATE-NACL 1-0.675 GM/50ML-% IV SOLN
1.0000 g | Freq: Once | INTRAVENOUS | Status: AC
  Administered 2024-02-02: 1000 mg via INTRAVENOUS
  Filled 2024-02-02: qty 50

## 2024-02-02 MED ORDER — FAMOTIDINE IN NACL 20-0.9 MG/50ML-% IV SOLN
20.0000 mg | Freq: Two times a day (BID) | INTRAVENOUS | Status: DC
  Administered 2024-02-02 – 2024-02-08 (×13): 20 mg via INTRAVENOUS
  Filled 2024-02-02 (×14): qty 50

## 2024-02-02 MED ORDER — PHENYLEPHRINE HCL-NACL 20-0.9 MG/250ML-% IV SOLN
INTRAVENOUS | Status: DC | PRN
  Administered 2024-02-02: 40 ug/min via INTRAVENOUS
  Administered 2024-02-02: 20 ug/min via INTRAVENOUS
  Administered 2024-02-02: 15 ug via INTRAVENOUS
  Administered 2024-02-02: 160 ug via INTRAVENOUS

## 2024-02-02 MED ORDER — SUCRALFATE 1 G PO TABS
1.0000 g | ORAL_TABLET | Freq: Three times a day (TID) | ORAL | Status: DC
  Filled 2024-02-02 (×2): qty 1

## 2024-02-02 MED ORDER — TRAVASOL 10 % IV SOLN
INTRAVENOUS | Status: AC
  Filled 2024-02-02: qty 998.4

## 2024-02-02 MED ORDER — MAGNESIUM SULFATE 2 GM/50ML IV SOLN
2.0000 g | Freq: Once | INTRAVENOUS | Status: AC
  Administered 2024-02-02: 2 g via INTRAVENOUS
  Filled 2024-02-02: qty 50

## 2024-02-02 MED FILL — Clopidogrel Bisulfate Tab 300 MG (Base Equiv): ORAL | Qty: 1 | Status: AC

## 2024-02-02 NOTE — Discharge Summary (Signed)
 Physician Discharge Summary   Patient: Chase Combs MRN: [id number] DOB: 24-Jun-1945  Admit date:     01/07/2024  Discharge date: {dischdate:26783}  Discharge Physician: Sigurd Pac   PCP: Wonda Worth SQUIBB, PA   Recommendations at discharge:  {Tip this will not be part of the note when signed- Example include specific recommendations for outpatient follow-up, pending tests to follow-up on. (Optional):26781}  ***  Discharge Diagnoses: Principal Problem:   Multifocal pneumonia Active Problems:   Coronary artery disease involving native coronary artery of native heart without angina pectoris   Acute hypoxemic respiratory failure (HCC)   Acute exacerbation of CHF (congestive heart failure) (HCC)   Chest pain   Ischemic cardiomyopathy   Acute on chronic systolic CHF (congestive heart failure) (HCC)  Resolved Problems:   * No resolved hospital problems. *  78 y.o. 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       Hospital Course: No notes on file      DISCHARGE MEDICATION: Allergies as of 01/19/2024       Reactions   Codeine Nausea And Vomiting   Latex Rash   Oral blisters when used at dentist        Medication List     STOP taking these medications    amLODipine  10 MG tablet Commonly known as: NORVASC    amLODipine  5 MG tablet Commonly known as: NORVASC    benzonatate  200 MG capsule Commonly known as: TESSALON    Cannabidiol  Powd   ibuprofen  100 MG tablet Commonly known as: ADVIL    valsartan  320 MG tablet Commonly known as: DIOVAN        TAKE these medications    amiodarone  200 MG tablet Commonly known as: PACERONE  Take 1 tablet (200 mg total) by mouth daily.   aspirin  EC 81 MG tablet Take 81 mg by mouth 2 (two)  times daily.   clotrimazole-betamethasone cream Commonly known as: LOTRISONE Apply 1 Application topically 2 (two) times daily as needed (skin irritation).   Entresto  49-51 MG Generic drug: sacubitril -valsartan  Take 1 tablet by mouth 2 (two) times daily.   furosemide  20 MG tablet Commonly known as: LASIX  Take 1 tablet (20 mg total) by mouth daily.   metoprolol  succinate 25 MG 24 hr tablet Commonly known as: TOPROL -XL Take 1 tablet (25 mg total) by mouth daily. What changed: how much to take   nitroGLYCERIN  0.4 MG SL tablet Commonly known as: Nitrostat  Place 1 tablet (0.4 mg total) under the tongue every 5 (five) minutes as needed for chest pain.   rosuvastatin  20 MG tablet Commonly known as: CRESTOR  TAKE 1 TABLET(20 MG) BY MOUTH DAILY   spironolactone  25 MG tablet Commonly known as: ALDACTONE  Take 0.5 tablets (12.5 mg total) by mouth daily.   tamsulosin  0.4 MG Caps capsule Commonly known as: Flomax  Take 1 capsule (0.4 mg total) by mouth daily after supper.   ticagrelor  90 MG Tabs tablet Commonly known as: BRILINTA  Take 1 tablet (90 mg total) by mouth 2 (two) times daily.        Follow-up Information     [facility] Follow up.   Why: Oxygen for home-portable to be delivered to the room Contact information: [address] [place] [address] [phone]  Discharge Exam: Filed Weights   01/14/24 0500 01/15/24 0500 01/18/24 0501  Weight: 96.5 kg 96.5 kg 87.8 kg   ***  Condition at discharge: {DC Condition:26389}  The results of significant diagnostics from this hospitalization (including imaging, microbiology, ancillary and laboratory) are listed below for reference.   Imaging Studies: ECHOCARDIOGRAM COMPLETE Result Date: 02/02/2024    ECHOCARDIOGRAM REPORT   Patient Name:   Chase Combs Date of Exam: 02/02/2024 Medical Rec #:  [id number]      Height:       72.0 in Accession #:    [id number]     Weight:       226.0  lb Date of Birth:  1945-10-05      BSA:          2.244 m Patient Age:    78 years       BP:           102/69 mmHg Patient Gender: M              HR:           102 bpm. Exam Location:  Inpatient Procedure: 2D Echo, Cardiac Doppler and Color Doppler (Both Spectral and Color            Flow Doppler were utilized during procedure). Indications:    Congestive Heart Failure I50.9  History:        Patient has prior history of Echocardiogram examinations, most                 recent 01/13/2024. CAD; Risk Factors:Hypertension and Sleep                 Apnea.  Sonographer:    Jayson Gaskins Referring Phys: [phone] AMY D CLEGG IMPRESSIONS  1. Left ventricular ejection fraction, by estimation, is 20 to 25%. The left ventricle has severely decreased function. The left ventricle demonstrates global hypokinesis. There is moderate concentric left ventricular hypertrophy. Indeterminate diastolic filling due to E-A fusion. There is abnormal (paradoxical) septal motion, consistent with right ventricular volume overload.  2. Right ventricular systolic function is moderately reduced. The right ventricular size is mildly enlarged. Tricuspid regurgitation signal is inadequate for assessing PA pressure. The estimated right ventricular systolic pressure is 30.7 mmHg.  3. The mitral valve is degenerative. Moderate mitral valve regurgitation.  4. Tricuspid valve regurgitation is mild to moderate.  5. The aortic valve is tricuspid. Aortic valve regurgitation is trivial. Aortic valve sclerosis is present, with no evidence of aortic valve stenosis.  6. The inferior vena cava is dilated in size with <50% respiratory variability, suggesting right atrial pressure of 15 mmHg. Comparison(s): Changes from prior study are noted. 01/13/2024: LVEF 25-30%, moderate RV systolic dysfunction. FINDINGS  Left Ventricle: Left ventricular ejection fraction, by estimation, is 20 to 25%. The left ventricle has severely decreased function. The left ventricle  demonstrates global hypokinesis. The left ventricular internal cavity size was normal in size. There is moderate concentric left ventricular hypertrophy. Abnormal (paradoxical) septal motion, consistent with right ventricular volume overload. Indeterminate diastolic filling due to E-A fusion. Right Ventricle: The right ventricular size is mildly enlarged. No increase in right ventricular wall thickness. Right ventricular systolic function is moderately reduced. Tricuspid regurgitation signal is inadequate for assessing PA pressure. The tricuspid regurgitant velocity is 1.98 m/s, and with an assumed right atrial pressure of 15 mmHg, the estimated right ventricular systolic pressure is 30.7 mmHg. Left Atrium: Left atrial size was normal in size. Right Atrium: Right  atrial size was normal in size. Pericardium: There is no evidence of pericardial effusion. Mitral Valve: The mitral valve is degenerative in appearance. Moderate mitral valve regurgitation. Tricuspid Valve: The tricuspid valve is normal in structure. Tricuspid valve regurgitation is mild to moderate. Aortic Valve: The aortic valve is tricuspid. Aortic valve regurgitation is trivial. Aortic valve sclerosis is present, with no evidence of aortic valve stenosis. Aortic valve mean gradient measures 5.0 mmHg. Aortic valve peak gradient measures 8.0 mmHg. Aortic valve area, by VTI measures 2.17 cm. Pulmonic Valve: The pulmonic valve was normal in structure. Pulmonic valve regurgitation is trivial. Aorta: The aortic root is normal in size and structure. Venous: The inferior vena cava is dilated in size with less than 50% respiratory variability, suggesting right atrial pressure of 15 mmHg. IAS/Shunts: No atrial level shunt detected by color flow Doppler.  LEFT VENTRICLE PLAX 2D LVIDd:         6.10 cm LVIDs:         5.70 cm LV PW:         1.20 cm LV IVS:        1.30 cm LVOT diam:     2.00 cm LV SV:         47 LV SV Index:   21 LVOT Area:     3.14 cm  RIGHT  VENTRICLE RV S prime:     14.00 cm/s TAPSE (M-mode): 1.8 cm LEFT ATRIUM             Index        RIGHT ATRIUM           Index LA Vol (A2C):   93.1 ml 41.49 ml/m  RA Area:     15.90 cm LA Vol (A4C):   30.5 ml 13.59 ml/m  RA Volume:   40.70 ml  18.14 ml/m LA Biplane Vol: 56.5 ml 25.18 ml/m  AORTIC VALVE AV Area (Vmax):    2.12 cm AV Area (Vmean):   1.99 cm AV Area (VTI):     2.17 cm AV Vmax:           141.00 cm/s AV Vmean:          101.000 cm/s AV VTI:            0.216 m AV Peak Grad:      8.0 mmHg AV Mean Grad:      5.0 mmHg LVOT Vmax:         95.00 cm/s LVOT Vmean:        63.900 cm/s LVOT VTI:          0.149 m LVOT/AV VTI ratio: 0.69  AORTA Ao Root diam: 2.90 cm MITRAL VALVE               TRICUSPID VALVE MV Area (PHT): 2.93 cm    TR Peak grad:   15.7 mmHg MV Decel Time: 259 msec    TR Vmax:        198.00 cm/s MV E velocity: 87.50 cm/s                            SHUNTS                            Systemic VTI:  0.15 m  Systemic Diam: 2.00 cm Vinie Maxcy MD Electronically signed by Vinie Maxcy MD Signature Date/Time: 02/02/2024/3:33:36 PM    Final    CARDIAC CATHETERIZATION Result Date: 02/01/2024 Images from the original result were not included. Coronary angiography and intervention 02/01/2024: LM: Distal 45% stenosis LAD: Ostially occluded stent with acute stent thrombosis (Culprit vessel) Ramus: 50% in-stent restenosis Lcx: Mild diffuse disease RCA: Not engaged today LVEDP 27 mmHg Successful percutaneous coronary intervention ostial prox LAD        PTCA with serial balloon dilatation, up to 3.0  X 12 mm Hodges balloon up to 20 atm Newman JINNY Lawrence, MD  CT ANGIO GI BLEED Result Date: 01/30/2024 CLINICAL DATA:  Lower gastrointestinal bleeding. EXAM: CTA ABDOMEN AND PELVIS WITHOUT AND WITH CONTRAST TECHNIQUE: Multidetector CT imaging of the abdomen and pelvis was performed using the standard protocol during bolus administration of intravenous contrast. Multiplanar reconstructed  images and MIPs were obtained and reviewed to evaluate the vascular anatomy. RADIATION DOSE REDUCTION: This exam was performed according to the departmental dose-optimization program which includes automated exposure control, adjustment of the mA and/or kV according to patient size and/or use of iterative reconstruction technique. CONTRAST:  OMNIPAQUE  IOHEXOL  350 MG/ML SOLN COMPARISON:  January 22, 2024. FINDINGS: VASCULAR Aorta: Atherosclerosis of abdominal aorta is noted without aneurysm or dissection. 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 No thrombus is noted. SMA: Patent without evidence of aneurysm, dissection, vasculitis or significant stenosis. Renals: Bilateral renal arteries are patent without evidence of aneurysm, dissection, vasculitis, fibromuscular dysplasia or significant stenosis. IMA: Patent without evidence of aneurysm, dissection, vasculitis or significant stenosis. Inflow: Patent without evidence of aneurysm, dissection, vasculitis or significant stenosis. Proximal Outflow: Bilateral common femoral and visualized portions of the superficial and profunda femoral arteries are patent without evidence of aneurysm, dissection, vasculitis or significant stenosis. Veins: No obvious venous abnormality within the limitations of this arterial phase study. Review of the MIP images confirms the above findings. NON-VASCULAR Lower chest: Minimal bilateral pleural effusions are noted with adjacent subsegmental atelectasis. Hepatobiliary: No focal liver abnormality is seen. No gallstones, gallbladder wall thickening, or biliary dilatation. Pancreas: Unremarkable. No pancreatic ductal dilatation or surrounding inflammatory changes. Spleen: Normal in size without focal abnormality. Adrenals/Urinary Tract: Probable right adrenal adenoma. Left adrenal gland is unremarkable. Stable bilateral renal cystic abnormalities as noted on prior exam. No hydronephrosis  or renal obstruction is noted. Urinary bladder is unremarkable. Stomach/Bowel: The stomach is unremarkable. There is no evidence of bowel obstruction. Sigmoid diverticulosis is noted. The appendix is unremarkable. Mild wall and fold thickening of ascending colon is noted suggesting possible infectious or inflammatory colitis. No definite evidence of contrast extravasation to suggest gastrointestinal bleeding. Rectal tube is noted. Lymphatic: No adenopathy is noted. 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 Other: Small fat containing periumbilical hernia.  No ascites. Musculoskeletal: No acute or significant osseous findings. 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 No definite evidence of contrast extravasation to suggest gastrointestinal bleeding. Mild wall and fold thickening of ascending colon is noted suggesting possible infectious or inflammatory colitis. Aortic Atherosclerosis (ICD10-I70.0). Electronically Signed   By: Lynwood Landy Raddle M.D.   On: 01/30/2024 14:25   DG Abd 1 View Result Date: 01/28/2024 CLINICAL DATA:  [id number] Ileus [facility]) [id number] EXAM: ABDOMEN - 1 VIEW COMPARISON:  January 25, 2024, January 22, 2024 FINDINGS: The stomach is not evaluated.Similar diffuse gaseous distension of the entire  colon with minimal rectal gas present. Generalized paucity of small bowel gas throughout the central abdomen.No pneumoperitoneum. Multilevel degenerative disc disease of the spine. IMPRESSION: Similar diffuse gaseous distension of the entire colon. Electronically Signed   By: Rogelia Myers M.D.   On: 01/28/2024 10:01   DG Abd 1 View Result Date: 01/25/2024 CLINICAL DATA:  Pneumatosis intestinalis EXAM: ABDOMEN - 1 VIEW COMPARISON:  Abdominal radiograph dated 01/24/2024. FINDINGS: Evaluation is limited due to body habitus. An enteric tube with tip over the epigastric area in the proximal stomach. Diffuse  gaseous distension of the colon relatively similar to prior radiograph. No definite free air. No acute osseous pathology. IMPRESSION: 1. Enteric tube with tip over the proximal stomach. 2. Diffuse gaseous distension of the colon. Electronically Signed   By: Vanetta Chou M.D.   On: 01/25/2024 14:37   DG Abd Portable 1V Result Date: 01/24/2024 EXAM: 1 VIEW XRAY OF THE ABDOMEN 01/24/2024 01:00:00 PM COMPARISON: 01/24/2024 CLINICAL HISTORY: Encounter for imaging study to confirm orogastric (OG) tube placement FINDINGS: BOWEL: Diffuse gaseous distension of the colon, unchanged. SOFT TISSUES: No opaque urinary calculi. Enteric tube in place with tip and side port projecting over the expected region of the gastric fundus. BONES: No acute osseous abnormality. IMPRESSION: 1. Enteric tube in place with tip and side port projecting over the expected region of the gastric fundus. 2. Diffuse gaseous distension of the colon, unchanged, presumably colonic ileus. Electronically signed by: Selinda Blue MD 01/24/2024 01:42 PM EDT RP Workstation: [id number]   DG Chest Port 1 View Result Date: 01/24/2024 EXAM: 1 VIEW XRAY OF THE CHEST 01/24/2024 01:00:00 PM COMPARISON: 01/22/2024 CLINICAL HISTORY: S/P PICC central line placement FINDINGS: LUNGS AND PLEURA: Low lung volumes. Elevated right hemidiaphragm. Bibasilar airspace opacities, likely atelectasis. Trace bilateral pleural effusions. HEART AND MEDIASTINUM: No acute abnormality of the cardiac and mediastinal silhouettes. BONES AND SOFT TISSUES: Advanced degenerative changes of right shoulder. LINES AND TUBES: Left upper extremity PICC with tip overlying the cavoatrial junction. Enteric tube courses into the stomach below the edge of the image with tip not seen. IMPRESSION: 1. Left PICC tip overlying the cavoatrial junction. 2. Low lung volumes, elevated right hemidiaphragm, and bibasilar airspace opacities, likely atelectasis. 3. Trace bilateral pleural effusions.  Electronically signed by: Selinda Blue MD 01/24/2024 01:41 PM EDT RP Workstation: [id number]   US  EKG SITE RITE Result Date: 01/24/2024 If Site Rite image not attached, placement could not be confirmed due to current cardiac rhythm.  DG Abd 1 View Result Date: 01/24/2024 CLINICAL DATA:  Ileus. EXAM: ABDOMEN - 1 VIEW COMPARISON:  11/29/2023.  CT scan 01/22/2024. FINDINGS: Marked gaseous distention of colon identified, similar to the scout film from yesterday's CT scan. Fiducial markers overlie the prostate region. IMPRESSION: Marked gaseous distention of colon, similar to the scout film from yesterday's CT scan. Electronically Signed   By: Camellia Candle M.D.   On: 01/24/2024 09:29   CT CHEST ABDOMEN PELVIS WO CONTRAST Result Date: 01/22/2024 EXAM: CT CHEST, ABDOMEN AND PELVIS WITHOUT CONTRAST 01/22/2024 02:21:05 PM TECHNIQUE: CT of the chest, abdomen and pelvis was performed without the administration of intravenous contrast. Multiplanar reformatted images are provided for review. Automated exposure control, iterative reconstruction, and/or weight based adjustment of the mA/kV was utilized to reduce the radiation dose to as low as reasonably achievable. COMPARISON: Chest radiograph of earlier today. CTA chest of 01/14/24. Most recent abdominal pelvic CT of 10/17/23. CLINICAL HISTORY: Sepsis. FINDINGS: CHEST: MEDIASTINUM AND LYMPH NODES: Moderate cardiomegaly. 3-vessel coronary artery  calcification. Pulmonary artery enlargement, outflow tract 3.4 cm. LUNGS AND PLEURA: Significantly improved multifocal, bilateral pneumonia since 08/22. Right lower lobe, left lower lobe, and posterior left upper lobe/lingular airspace and ground glass opacities persist. Persistent scarring in the right middle lobe. Moderate right hemidiaphragm elevation. ABDOMEN AND PELVIS: LIVER: Fatty replaced pancreatic head and uncinate process. GALLBLADDER AND BILE DUCTS: No biliary ductal dilatation. SPLEEN: No acute abnormality. PANCREAS:  Fatty replaced pancreatic head and uncinate process. Minimal pancreatic parenchymal calcification. ADRENAL GLANDS: Minimal right adrenal nodularity. KIDNEYS, URETERS AND BLADDER: Bilateral fluid density renal lesions are likely cysts. interpolar right renal exophytic 2.7 cm lesion favored to represent a complex cyst when correlated with 10/17/23 contrast-enhanced CT. No specific follow up indicated. Tiny interpolar left renal lesion is likely a hemorrhagic/proteinaceous cyst. Interpolar punctate right renal collecting system calculus. Symmetric perinephric interstitial thickening is nonspecific. Median lobe impression into the urinary bladder. GI AND BOWEL: Fluid throughout the colon suggest a diarrheal state. Scattered colonic diverticula. Diffuse mild colonic distention including up to 7.7 cm. Lucency along the ascending colonic and cecal wall, suspicious for pneumatosis including an 81/3. Normal terminal ileum and a diminutive appendix. Fluid-filled mildly dilated small bowel throughout including a 4.6 cm on image 81/3. No transition point identified. 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 PERITONEUM AND RETROPERITONEUM: Small pelvic fluid is increased. Trace fluid in the right paracolic gutter. No free intraperitoneal or mesenteric air. VASCULATURE: Advanced aortic and branch vessel atherosclerosis. ABDOMINAL AND PELVIS LYMPH NODES: No lymphadenopathy. 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 BONES AND SOFT TISSUES: Fat-containing small ventral abdominal wall hernia. IMPRESSION: 1. Significantly improved multifocal, bilateral pneumonia since 01/14/24. Persistent airspace and ground glass opacities in the right lower lobe, left lower lobe, and posterior left upper lobe/lingula, likely residual pneumonia or aspiration. 2. Gas along the wall of the ascending colon and cecum, suspicious for pneumatosis. Findings called to [place] at 3:19 pm. 3. Fluid-filled colon suggests  a diarrheal state. Diffuse colonic and small bowel distention likely represent Ileus. 4. Increased small volume pelvic and new right-sided pericolonic trace fluid, likely secondary to the colonic proces. 5. Incidental findings, including: Pulmonary artery enlargement suggests pulmonary arterial hypertension. Aortic and coronary artery atherosclerosis. Right nephrolithiasis. Suspicion of minimal chronic calcific pancreatitis. Electronically signed by: Rockey Kilts MD 01/22/2024 03:21 PM EDT RP Workstation: [id number]   DG Chest Portable 1 View Result Date: 01/22/2024 EXAM: 1 VIEW XRAY OF THE CHEST 01/22/2024 12:58:00 PM COMPARISON: 01/17/2024 CLINICAL HISTORY: Cough. Per ED triage notes: Pt c.o generalized weakness and emesis this morning. Pt had cardiac stent placed on Monday and hasn't been feeling well since. Pt ill appearing in triage. Hypotensive in the 60s, pt taken straight back to RM 23, EDP at bedside. Best obtainable images due to pt condition. FINDINGS: LUNGS AND PLEURA: Low lung volumes. Bibasilar linear and patchy opacities. Small stable bilateral pleural effusion. HEART AND MEDIASTINUM: Elevated right hemidiaphragm. BONES AND SOFT TISSUES: Severe right shoulder degenerative changes. IMPRESSION: 1. Low lung volumes with bibasilar linear and patchy opacities. 2. Small stable bilateral pleural effusion. 3. Elevated right hemidiaphragm. Electronically signed by: Lonni Necessary MD 01/22/2024 01:16 PM EDT RP Workstation: [id number]   CARDIAC CATHETERIZATION Result Date: 01/17/2024 Conclusions: Severe single-vessel coronary artery disease with 70-80% proximal LAD stenosis that is highly significant by hemodynamic assessment (RFR = 0.67).  Moderate LMCA disease (MLA 6.5 mm) and 40% proximal/mid RCA Denovo disease also noted. Patent ramus intermedius stent with 50% in-stent restenosis. Patent proximal RCA stent with  40-50% in-stent restenosis. Upper normal left heart filling pressures (PCWP and  LVEDP 15 mmHg). Mildly elevated right heart and pulmonary artery pressures (mean RA 7, RV 47/7, mean PA 25 mmHg). Normal Fick cardiac output/index (CO 6.7 L/min, CI 3.1 L/min/m). Successful IVUS-guided PCI to ostial/proximal LAD using Synergy XD 3.0 x 20 mm drug-eluting stent with 0% residual stenosis and TIMI-3 flow. Recommendations: Dual antiplatelet therapy with aspirin  and ticagrelor  for at least 12 months. Aggressive secondary prevention of coronary artery disease. Maintain net even to slightly negative fluid balance. Continue escalation of goal-directed medical therapy for mixed ischemic and nonischemic cardiomyopathy. Keep at [facility] for ongoing management of CAD/CHF as well as pneumonia/bronchitis. Lonni Hanson, MD [facility]  DG CHEST PORT 1 VIEW Result Date: 01/17/2024 CLINICAL DATA:  Short of breath EXAM: PORTABLE CHEST 1 VIEW COMPARISON:  None Available. FINDINGS: Normal cardiac silhouette. Bibasilar atelectasis similar to prior. Small bilateral pleural effusions. Slight improvement aeration of the lung bases. No pneumothorax IMPRESSION: Mild improvement in basilar atelectasis. Electronically Signed   By: Jackquline Boxer M.D.   On: 01/17/2024 09:09   DG CHEST PORT 1 VIEW Result Date: 01/16/2024 CLINICAL DATA:  [id number] SOB (shortness of breath) [id number] EXAM: PORTABLE CHEST - 1 VIEW COMPARISON:  01/15/2024 FINDINGS: Right perihilar and bibasilar airspace opacities slightly increased from previous. Blunting of lateral costophrenic angles suggesting effusions. Right glenohumeral and left AC DJD. Heart size and mediastinal contours are within normal limits. IMPRESSION: Slight increase in right perihilar and bibasilar airspace disease. Electronically Signed   By: JONETTA Faes M.D.   On: 01/16/2024 11:03   DG CHEST PORT 1 VIEW Result Date: 01/15/2024 EXAM: 1 VIEW XRAY OF THE CHEST 01/15/2024 05:50:00 AM COMPARISON: None available. CLINICAL HISTORY: [id number] SOB (shortness of breath) [id number]. Fever  sob FINDINGS: LUNGS AND PLEURA: Stable linear opacities in the right mid lung and in both lung bases. Small pleural effusions. HEART AND MEDIASTINUM: No acute abnormality of the cardiac and mediastinal silhouettes. Aortic atherosclerosis (ICD10-i70.0). BONES AND SOFT TISSUES: No acute osseous abnormality. IMPRESSION: 1. Stable linear opacities in the right mid lung and in both lung bases. 2. Small pleural effusions. Electronically signed by: Katheleen Faes MD 01/15/2024 09:47 AM EDT RP Workstation: [id number]   CT Angio Chest Pulmonary Embolism (PE) W or WO Contrast Result Date: 01/14/2024 CLINICAL DATA:  Chronic dyspnea. Chest wall or pleural disease suspected. Shortness of breath and chest pain. EXAM: CT ANGIOGRAPHY CHEST WITH CONTRAST TECHNIQUE: Multidetector CT imaging of the chest was performed using the standard protocol during bolus administration of intravenous contrast. Multiplanar CT image reconstructions and MIPs were obtained to evaluate the vascular anatomy. RADIATION DOSE REDUCTION: This exam was performed according to the departmental dose-optimization program which includes automated exposure control, adjustment of the mA and/or kV according to patient size and/or use of iterative reconstruction technique. CONTRAST:  75mL OMNIPAQUE  IOHEXOL  350 MG/ML SOLN COMPARISON:  Chest radiograph 01/14/2024.  CT chest 01/12/2024 FINDINGS: Cardiovascular: Cardiac enlargement. No pericardial effusions. Normal caliber thoracic aorta. Calcification of the aorta and coronary arteries. Central pulmonary arteries are well opacified. No evidence of significant pulmonary embolus. Mediastinum/Nodes: Thyroid  gland is unremarkable. Esophagus is decompressed. Scattered mediastinal lymph nodes are not pathologically enlarged, likely reactive. Lungs/Pleura: Small left pleural effusion. This is similar to prior study. Atelectasis or consolidation seen in both lung bases also similar. Patchy airspace infiltration throughout the  right lung remains present but is mildly improved since the prior CT. Motion artifact limits examination. No pneumothorax. Upper Abdomen: No acute  abnormalities. Musculoskeletal: Degenerative changes in the spine and shoulders. No acute bony abnormalities. Review of the MIP images confirms the above findings. IMPRESSION: 1. Persistent diffuse right lung infiltrates with mild improvement since prior study. 2. Persistent left pleural effusion and bilateral basilar consolidation or atelectasis, similar to prior study. 3. Cardiac enlargement. 4. Aortic atherosclerosis. 5. No evidence of significant pulmonary embolus. Electronically Signed   By: Elsie Gravely M.D.   On: 01/14/2024 18:12   DG CHEST PORT 1 VIEW Result Date: 01/14/2024 CLINICAL DATA:  Shortness of breath. EXAM: PORTABLE CHEST 1 VIEW COMPARISON:  01/13/2024 FINDINGS: Stable asymmetric elevation right hemidiaphragm with similar right base streaky opacity compatible with atelectasis or infiltrate. The cardio pericardial silhouette is enlarged. No acute bony abnormality. Degenerative changes noted in the right shoulder. IMPRESSION: Similar right base atelectasis or infiltrate. Electronically Signed   By: Camellia Candle M.D.   On: 01/14/2024 09:21   ECHOCARDIOGRAM COMPLETE Result Date: 01/13/2024    ECHOCARDIOGRAM REPORT   Patient Name:   Chase Combs Date of Exam: 01/13/2024 Medical Rec #:  [id number]      Height:       72.0 in Accession #:    [id number]     Weight:       213.4 lb Date of Birth:  [birth year]      BSA:          2.190 m Patient Age:    78 years       BP:           134/89 mmHg Patient Gender: M              HR:           104 bpm. Exam Location:  Inpatient Procedure: 2D Echo, Color Doppler and Cardiac Doppler (Both Spectral and Color            Flow Doppler were utilized during procedure). Indications:    Dyspnea  History:        Patient has prior history of Echocardiogram examinations, most                 recent 07/12/2023. CAD.   Sonographer:    Benard Stallion Referring Phys: [id number] OMAIR LATIF [facility] IMPRESSIONS  1. Left ventricular ejection fraction, by estimation, is 25 to 30%. The left ventricle has severely decreased function. The left ventricle demonstrates global hypokinesis. The left ventricular internal cavity size was moderately dilated. Left ventricular diastolic parameters are consistent with Grade I diastolic dysfunction (impaired relaxation).  2. Right ventricular systolic function is moderately reduced. The right ventricular size is normal. There is severely elevated pulmonary artery systolic pressure.  3. Left atrial size was moderately dilated.  4. Right atrial size was mildly dilated.  5. The mitral valve is normal in structure. Mild mitral valve regurgitation. No evidence of mitral stenosis.  6. The aortic valve is normal in structure. Aortic valve regurgitation is mild. No aortic stenosis is present.  7. The inferior vena cava is dilated in size with <50% respiratory variability, suggesting right atrial pressure of 15 mmHg. FINDINGS  Left Ventricle: Left ventricular ejection fraction, by estimation, is 25 to 30%. The left ventricle has severely decreased function. The left ventricle demonstrates global hypokinesis. The left ventricular internal cavity size was moderately dilated. There is no left ventricular hypertrophy. Abnormal (paradoxical) septal motion, consistent with left bundle branch block. Left ventricular diastolic function could not be evaluated due to atrial fibrillation. Left ventricular diastolic parameters are consistent with  Grade I diastolic dysfunction (impaired relaxation). Right Ventricle: The right ventricular size is normal. No increase in right ventricular wall thickness. Right ventricular systolic function is moderately reduced. There is severely elevated pulmonary artery systolic pressure. The tricuspid regurgitant velocity is 3.65 m/s, and with an assumed right atrial pressure of 15 mmHg,  the estimated right ventricular systolic pressure is 68.3 mmHg. Left Atrium: Left atrial size was moderately dilated. Right Atrium: Right atrial size was mildly dilated. Pericardium: There is no evidence of pericardial effusion. Mitral Valve: The mitral valve is normal in structure. Mild mitral valve regurgitation. No evidence of mitral valve stenosis. Tricuspid Valve: The tricuspid valve is normal in structure. Tricuspid valve regurgitation is mild . No evidence of tricuspid stenosis. Aortic Valve: The aortic valve is normal in structure. Aortic valve regurgitation is mild. No aortic stenosis is present. Aortic valve mean gradient measures 4.5 mmHg. Aortic valve peak gradient measures 8.1 mmHg. Aortic valve area, by VTI measures 2.86 cm. Pulmonic Valve: The pulmonic valve was normal in structure. Pulmonic valve regurgitation is not visualized. No evidence of pulmonic stenosis. Aorta: The aortic root is normal in size and structure. Venous: The inferior vena cava is dilated in size with less than 50% respiratory variability, suggesting right atrial pressure of 15 mmHg. IAS/Shunts: No atrial level shunt detected by color flow Doppler.  LEFT VENTRICLE PLAX 2D LVIDd:         6.90 cm      Diastology LVIDs:         5.60 cm      LV e' medial:    5.77 cm/s LV PW:         1.00 cm      LV E/e' medial:  19.1 LV IVS:        1.00 cm      LV e' lateral:   7.40 cm/s LVOT diam:     2.30 cm      LV E/e' lateral: 14.9 LV SV:         64 LV SV Index:   29 LVOT Area:     4.15 cm  LV Volumes (MOD) LV vol d, MOD A2C: 207.0 ml LV vol d, MOD A4C: 178.0 ml LV vol s, MOD A2C: 144.0 ml LV vol s, MOD A4C: 113.0 ml LV SV MOD A2C:     63.0 ml LV SV MOD A4C:     178.0 ml LV SV MOD BP:      64.2 ml RIGHT VENTRICLE RV Basal diam:  4.50 cm RV Mid diam:    4.00 cm RV S prime:     14.60 cm/s TAPSE (M-mode): 2.5 cm LEFT ATRIUM              Index        RIGHT ATRIUM           Index LA diam:        4.60 cm  2.10 cm/m   RA Area:     16.80 cm LA Vol  (A2C):   108.0 ml 49.31 ml/m  RA Volume:   42.50 ml  19.41 ml/m LA Vol (A4C):   82.7 ml  37.76 ml/m LA Biplane Vol: 100.0 ml 45.66 ml/m  AORTIC VALVE AV Area (Vmax):    2.73 cm AV Area (Vmean):   2.83 cm AV Area (VTI):     2.86 cm AV Vmax:           142.50 cm/s AV Vmean:  98.800 cm/s AV VTI:            0.224 m AV Peak Grad:      8.1 mmHg AV Mean Grad:      4.5 mmHg LVOT Vmax:         93.60 cm/s LVOT Vmean:        67.300 cm/s LVOT VTI:          0.154 m LVOT/AV VTI ratio: 0.69  AORTA Ao Root diam: 3.20 cm Ao Asc diam:  3.65 cm MITRAL VALVE                TRICUSPID VALVE MV Area (PHT): 6.37 cm     TR Peak grad:   53.3 mmHg MV Decel Time: 119 msec     TR Vmax:        365.00 cm/s MR Peak grad: 75.3 mmHg MR Vmax:      434.00 cm/s   SHUNTS MV E velocity: 110.00 cm/s  Systemic VTI:  0.15 m MV A velocity: 60.20 cm/s   Systemic Diam: 2.30 cm MV E/A ratio:  1.83 Toribio Fuel MD Electronically signed by Toribio Fuel MD Signature Date/Time: 01/13/2024/11:56:11 AM    Final    DG CHEST PORT 1 VIEW Result Date: 01/13/2024 CLINICAL DATA:  [id number] SOB (shortness of breath) [id number] EXAM: DG CHEST 1V PORT COMPARISON:  01/12/2024 FINDINGS: Elevation of the right hemidiaphragm. Progressive clearing in the right upper lung zone. Patchy airspace opacities in the right lung base, unchanged. Streaky left basilar airspace opacities, likely atelectasis, unchanged. The left costophrenic sulcus is excluded from the field of view. No pneumothorax. No cardiomegaly. Tortuous aorta with aortic atherosclerosis. No acute fracture or destructive lesions. Multilevel thoracic osteophytosis. IMPRESSION: 1. Similar patchy airspace disease in the right lung base with progressive clearing in the right upper lung zone. 2. Small left pleural effusion with streaky atelectasis in the left lung base. Electronically Signed   By: Rogelia Myers M.D.   On: 01/13/2024 08:47   DG CHEST PORT 1 VIEW Result Date: 01/12/2024 CLINICAL DATA:   Shortness of breath. EXAM: PORTABLE CHEST 1 VIEW COMPARISON:  01/11/2024. FINDINGS: Cardiomegaly with central pulmonary vascular congestion. Patchy airspace opacities throughout the right lung, most pronounced at the right mid to lower lung zone. Streaky left basilar opacities. Similar trace left effusion. No pneumothorax. Advanced degenerative arthropathy of the right glenohumeral joint. No acute osseous abnormality. IMPRESSION: 1. Patchy airspace opacities throughout the right lung and streaky left basilar opacities, concerning for multifocal pneumonia. 2. Similar trace left effusion. 3. Cardiomegaly with central pulmonary vascular congestion. Electronically Signed   By: Harrietta Sherry M.D.   On: 01/12/2024 09:53   CT CHEST WO CONTRAST Result Date: 01/12/2024 CLINICAL DATA:  Shortness of breath, cough. Recent pneumonia. Evaluate for pleural effusion. EXAM: CT CHEST WITHOUT CONTRAST TECHNIQUE: Multidetector CT imaging of the chest was performed following the standard protocol without IV contrast. RADIATION DOSE REDUCTION: This exam was performed according to the departmental dose-optimization program which includes automated exposure control, adjustment of the mA and/or kV according to patient size and/or use of iterative reconstruction technique. COMPARISON:  01/08/2024 FINDINGS: Cardiovascular: Heart is mildly enlarged. Diffuse coronary artery and moderate aortic atherosclerosis. No evidence of aortic aneurysm. Mediastinum/Nodes: Small borderline is crash that borderline sized mediastinal lymph nodes, likely reactive. No axillary or visible hilar adenopathy. Trachea and esophagus are unremarkable. Thyroid  unremarkable. Lungs/Pleura: Airspace disease again noted throughout the right lung, slightly improved in the right upper lobe, not significantly changed in  the right middle lobe or right lower lobe. Increasing airspace disease in the left lower lobe. Small left pleural effusion is stable since prior study.  Trace right pleural effusion, also stable. Upper Abdomen: No acute findings Musculoskeletal: Chest wall soft tissues are unremarkable. No acute bony abnormality. IMPRESSION: Airspace disease throughout the right lung with some improvement in the right upper lobe, unchanged in the right middle lobe and right lower lobe. Increasing airspace disease in the left lower lobe. Findings compatible with multifocal pneumonia. Small left pleural effusion and trace right pleural effusion, stable since prior study. Cardiomegaly, three-vessel coronary artery disease. Aortic Atherosclerosis (ICD10-I70.0). Electronically Signed   By: Franky Crease M.D.   On: 01/12/2024 02:35   DG CHEST PORT 1 VIEW Result Date: 01/11/2024 CLINICAL DATA:  Cough.  Pneumonia. EXAM: PORTABLE CHEST 1 VIEW COMPARISON:  Chest radiograph dated 01/07/2024. FINDINGS: Mild eventration of the right hemidiaphragm. Bibasilar atelectasis/scarring. No interval new is knee right perihilar densities which may represent atelectasis or pneumonia. No large pleural effusion. No pneumothorax. Stable cardiac silhouette. No acute osseous pathology. IMPRESSION: Right perihilar atelectasis or pneumonia. Electronically Signed   By: Vanetta Chou M.D.   On: 01/11/2024 15:44   CT Angio Chest Pulmonary Embolism (PE) W or WO Contrast Result Date: 01/08/2024 CLINICAL DATA:  Pulmonary embolism (PE) suspected, high prob. Shortness of breath EXAM: CT ANGIOGRAPHY CHEST WITH CONTRAST TECHNIQUE: Multidetector CT imaging of the chest was performed using the standard protocol during bolus administration of intravenous contrast. Multiplanar CT image reconstructions and MIPs were obtained to evaluate the vascular anatomy. RADIATION DOSE REDUCTION: This exam was performed according to the departmental dose-optimization program which includes automated exposure control, adjustment of the mA and/or kV according to patient size and/or use of iterative reconstruction technique. CONTRAST:   75mL OMNIPAQUE  IOHEXOL  350 MG/ML SOLN COMPARISON:  10/10/2023.  Chest x-ray today. FINDINGS: Cardiovascular: Cardiomegaly. Three-vessel coronary artery disease and moderate aortic atherosclerosis. No evidence of aortic aneurysm. Mediastinum/Nodes: No mediastinal, hilar, or axillary adenopathy. Trachea and esophagus are unremarkable. Lungs/Pleura: Patchy airspace disease throughout the right lung most compatible with pneumonia. Small left pleural effusion. Compressive atelectasis in the left lower lobe. Upper Abdomen: No acute findings Musculoskeletal: Chest wall soft tissues are unremarkable. No acute bony abnormality. Review of the MIP images confirms the above findings. IMPRESSION: Extensive patchy airspace disease throughout the right lung most compatible with pneumonia. Small left pleural effusion with left lower lobe atelectasis. Cardiomegaly, diffuse 3 vessel coronary artery disease. No evidence of pulmonary embolus. Aortic Atherosclerosis (ICD10-I70.0). Electronically Signed   By: Franky Crease M.D.   On: 01/08/2024 00:53   DG Chest Port 1 View Result Date: 01/07/2024 CLINICAL DATA:  Shortness of breath. EXAM: PORTABLE CHEST 1 VIEW COMPARISON:  10/16/2023 FINDINGS: Stable cardiomediastinal silhouette. Patchy airspace opacities in the right lung. Central lucency in the opacities in the right upper lung. Left basilar atelectasis or pneumonia. Small bilateral pleural effusions. No pneumothorax. IMPRESSION: 1. Multifocal pneumonia in the right lung. Central lucency in the opacities in the right upper lung concerning for cavitation. CT is recommended for further evaluation. 2. Left basilar atelectasis or pneumonia. 3. Small bilateral pleural effusions. Electronically Signed   By: Norman Gatlin M.D.   On: 01/07/2024 23:28    Microbiology: Results for orders placed or performed during the hospital encounter of 01/07/24  Culture, blood (Routine X 2) w Reflex to ID Panel     Status: None   Collection Time:  01/08/24  1:10 AM   Specimen: BLOOD  LEFT FOREARM  Result Value Ref Range Status   Specimen Description   Final    BLOOD LEFT FOREARM Performed at [facility], [address] [place], [place] [address]    Special Requests   Final    BOTTLES DRAWN AEROBIC AND ANAEROBIC Blood Culture adequate volume Performed at [facility], [address] [place], [place] [address]    Culture   Final    NO GROWTH 5 DAYS Performed at [facility], [address] [place], [place] [address]    Report Status 01/13/2024 FINAL  Final  Culture, blood (Routine X 2) w Reflex to ID Panel     Status: None   Collection Time: 01/08/24  1:20 AM   Specimen: BLOOD LEFT HAND  Result Value Ref Range Status   Specimen Description   Final    BLOOD LEFT HAND Performed at [facility], [address] [place], [place] [address]    Special Requests   Final    BOTTLES DRAWN AEROBIC AND ANAEROBIC Blood Culture results may not be optimal due to an inadequate volume of blood received in culture bottles Performed at [facility], [address] [place], [place] [address]    Culture   Final    NO GROWTH 5 DAYS Performed at [facility], [address], [place], [place] [address]    Report Status 01/13/2024 FINAL  Final  Resp panel by RT-PCR (RSV, Flu A&B, Covid) Anterior Nasal Swab     Status: None   Collection Time: 01/08/24  1:29 AM   Specimen: Anterior Nasal Swab  Result Value Ref Range Status   SARS Coronavirus 2 by RT PCR NEGATIVE NEGATIVE Final    Comment: (NOTE) SARS-CoV-2 target nucleic acids are NOT DETECTED.  The SARS-CoV-2 RNA is generally detectable in upper respiratory specimens during the acute phase of infection. The lowest concentration of SARS-CoV-2 viral copies this assay can detect is 138 copies/mL. A negative result does not preclude SARS-Cov-2 infection and should not be used as the sole basis for  treatment or other patient management decisions. A negative result may occur with  improper specimen collection/handling, submission of specimen other than nasopharyngeal swab, presence of viral mutation(s) within the areas targeted by this assay, and inadequate number of viral copies(<138 copies/mL). A negative result must be combined with clinical observations, patient history, and epidemiological information. The expected result is Negative.  Fact Sheet for Patients:  BloggerCourse.com  Fact Sheet for Healthcare Providers:  SeriousBroker.it  This test is no t yet approved or cleared by the [place]  FDA and  has been authorized for detection and/or diagnosis of SARS-CoV-2 by FDA under an Emergency Use Authorization (EUA). This EUA will remain  in effect (meaning this test can be used) for the duration of the COVID-19 declaration under Section 564(b)(1) of the Act, 21 U.S.C.section 360bbb-3(b)(1), unless the authorization is terminated  or revoked sooner.       Influenza A by PCR NEGATIVE NEGATIVE Final   Influenza B by PCR NEGATIVE NEGATIVE Final    Comment: (NOTE) The Xpert Xpress SARS-CoV-2/FLU/RSV plus assay is intended as an aid in the diagnosis of influenza from Nasopharyngeal swab specimens and should not be used as a sole basis for treatment. Nasal washings and aspirates are unacceptable for Xpert Xpress SARS-CoV-2/FLU/RSV testing.  Fact Sheet for Patients: BloggerCourse.com  Fact Sheet for Healthcare Providers: SeriousBroker.it  This test is not yet approved or cleared by the [place]  FDA and  has been authorized for detection and/or diagnosis of SARS-CoV-2 by FDA under an Emergency Use Authorization (EUA). This EUA will remain in effect (meaning this test can be used) for the duration of the COVID-19 declaration under Section 564(b)(1) of the Act, 21  U.S.C. section 360bbb-3(b)(1), unless the authorization is terminated or revoked.     Resp Syncytial Virus by PCR NEGATIVE NEGATIVE Final    Comment: (NOTE) Fact Sheet for Patients: BloggerCourse.com  Fact Sheet for Healthcare Providers: SeriousBroker.it  This test is not yet approved or cleared by the [place]  FDA and has been authorized for detection and/or diagnosis of SARS-CoV-2 by FDA under an Emergency Use Authorization (EUA). This EUA will remain in effect (meaning this test can be used) for the duration of the COVID-19 declaration under Section 564(b)(1) of the Act, 21 U.S.C. section 360bbb-3(b)(1), unless the authorization is terminated or revoked.  Performed at [facility], [address] [place], [place] [address]   Respiratory (~20 pathogens) panel by PCR     Status: Abnormal   Collection Time: 01/08/24  9:54 AM   Specimen: Nasopharyngeal Swab; Respiratory  Result Value Ref Range Status   Adenovirus NOT DETECTED NOT DETECTED Final   Coronavirus 229E NOT DETECTED NOT DETECTED Final    Comment: (NOTE) The Coronavirus on the Respiratory Panel, DOES NOT test for the novel  Coronavirus (2019 nCoV)    Coronavirus HKU1 NOT DETECTED NOT DETECTED Final   Coronavirus NL63 NOT DETECTED NOT DETECTED Final   Coronavirus OC43 NOT DETECTED NOT DETECTED Final   Metapneumovirus DETECTED (A) NOT DETECTED Final   Rhinovirus / Enterovirus NOT DETECTED NOT DETECTED Final   Influenza A NOT DETECTED NOT DETECTED Final   Influenza B NOT DETECTED NOT DETECTED Final   Parainfluenza Virus 1 NOT DETECTED NOT DETECTED Final   Parainfluenza Virus 2 NOT DETECTED NOT DETECTED Final   Parainfluenza Virus 3 NOT DETECTED NOT DETECTED Final   Parainfluenza Virus 4 NOT DETECTED NOT DETECTED Final   Respiratory Syncytial Virus NOT DETECTED NOT DETECTED Final   Bordetella pertussis NOT DETECTED NOT DETECTED Final    Bordetella Parapertussis NOT DETECTED NOT DETECTED Final   Chlamydophila pneumoniae NOT DETECTED NOT DETECTED Final   Mycoplasma pneumoniae NOT DETECTED NOT DETECTED Final    Comment: Performed at [facility], [address], [place], [place] [address]    Labs: CBC: Recent Labs  Lab 01/30/24 0830 01/30/24 1215 01/31/24 0530 01/31/24 2300 02/01/24 0458 02/01/24 1706 02/01/24 2358 02/02/24 0525  WBC 6.5   < > 6.9  --  9.8 12.5* 12.6* 10.9*  NEUTROABS 5.3  --  5.5  --  8.1*  --   --   --   HGB 7.7*   < > 7.5* 7.8* 9.2* 8.5* 7.9* 7.3*  HCT 23.5*   < > 23.3* 24.1* 27.5* 26.3* 24.7* 22.2*  MCV 92.2   < > 89.3  --  87.3 88.9 93.6 93.7  PLT 251   < > 246  --  291 267 266 251   < > = values in this interval not displayed.   Basic Metabolic Panel: Recent Labs  Lab 01/29/24 0339 01/30/24 0535 01/31/24 0530 02/01/24 0458 02/01/24 0615 02/01/24 0830 02/02/24 0737  NA 141 137 136  --  130* 136 138  K 4.0 3.7 3.8  --  5.4* 3.8 4.3  CL 108 106 107  --  97* 103 102  CO2 21* 26 25  --  24 24 25   GLUCOSE  176* 188* 177*  --  721* 178* 201*  BUN 19 16 12   --  15 16 26*  CREATININE 0.83 0.70 0.61  --  0.69 0.64 0.97  CALCIUM  7.5* 7.4* 7.6*  --  7.9* 8.1* 7.8*  MG 2.2 1.9 1.8 2.3  --   --  1.8  PHOS 3.4 2.9 2.8  --  5.3* 3.6  --    Liver Function Tests: Recent Labs  Lab 01/27/24 0539 01/28/24 0400 01/29/24 0339 01/31/24 0530 02/01/24 0615 02/02/24 0737  AST 14* 14* 14* 13*  --  165*  ALT 20 18 16 14   --  30  ALKPHOS 59 58 61 48  --  57  BILITOT 0.9 0.6 0.5 0.3  --  0.4  PROT 5.4* 5.0* 4.7* 4.3*  --  4.5*  ALBUMIN 1.8* 1.8* 1.9* 1.8* 1.9* 2.0*   CBG: Recent Labs  Lab 02/01/24 1951 02/02/24 0000 02/02/24 0414 02/02/24 0730 02/02/24 1132  GLUCAP 250* 181* 202* 193* 207*    Discharge time spent: {LESS THAN/GREATER THAN:26388} 30 minutes.  Signed: Sigurd Pac, MD [facility] 02/02/2024

## 2024-02-02 NOTE — Anesthesia Preprocedure Evaluation (Addendum)
 Anesthesia Evaluation  Patient identified by MRN, date of birth, ID band Patient awake    Reviewed: Allergy & Precautions, NPO status , Patient's Chart, lab work & pertinent test results  History of Anesthesia Complications (+) PONV and history of anesthetic complications  Airway Mallampati: II  TM Distance: >3 FB Neck ROM: Full    Dental  (+) Missing, Dental Advisory Given, Partial Upper,    Pulmonary sleep apnea (no cpap)    breath sounds clear to auscultation       Cardiovascular hypertension, Pt. on medications and Pt. on home beta blockers (-) angina + CAD, + Past MI (2006 2010), +CHF and + DOE   Rhythm:Regular  LV function severely reduced. RV mildly reduced on todays echo On milrinone  0.5 Amio for afib   Neuro/Psych negative neurological ROS  negative psych ROS   GI/Hepatic Neg liver ROS,,,GI hemorrhage, epigastric pain, large volume rectal bleeding   Endo/Other  neg diabetes    Renal/GU negative Renal ROS     Musculoskeletal  (+) Arthritis , Osteoarthritis,    Abdominal   Peds  Hematology  (+) Blood dyscrasia, anemia Hg 7.3, 1pRBC actively being transfused   Anesthesia Other Findings   Reproductive/Obstetrics                              Anesthesia Physical Anesthesia Plan  ASA: 4  Anesthesia Plan: MAC   Post-op Pain Management:    Induction:   PONV Risk Score and Plan: 2 and Propofol  infusion and TIVA  Airway Management Planned: Natural Airway and Simple Face Mask  Additional Equipment: None  Intra-op Plan:   Post-operative Plan:   Informed Consent: I have reviewed the patients History and Physical, chart, labs and discussed the procedure including the risks, benefits and alternatives for the proposed anesthesia with the patient or authorized representative who has indicated his/her understanding and acceptance.     Dental advisory given  Plan Discussed  with: CRNA  Anesthesia Plan Comments:          Anesthesia Quick Evaluation

## 2024-02-02 NOTE — H&P (View-Only) (Signed)
 [facility] ROUNDING NOTE   Subjective: Patient had an episode of epigastric and lower chest pain last night, was given 600 mg of ibuprofen  overnight Epigastric pain is improving right now He had 1 dark burgundy appearing bowel movement last night, 2 additional large bloody bowel movements this morning  Hemoglobin trended down from 9-7   Objective: Vital signs in last 24 hours: Temp:  [97.7 F (36.5 C)-98.2 F (36.8 C)] 97.7 F (36.5 C) (09/10 0900) Pulse Rate:  [60-145] 94 (09/10 0900) Resp:  [12-38] 28 (09/10 0900) BP: (82-133)/(48-121) 106/67 (09/10 0900) SpO2:  [78 %-100 %] 97 % (09/10 0900) Weight:  [102.5 kg] 102.5 kg (09/10 0421) Last BM Date : 02/01/24 General: NAD Lungs: Bilateral clear Heart: Irregular heart rate, tachycardic, no murmurs Abdomen: Soft, no distention no tenderness or rebound Ext: No edema    Intake/Output from previous day: 09/09 0701 - 09/10 0700 In: 2961.3 [I.V.:2861.3; IV Piggyback:100] Out: 2250 [Urine:2250] Intake/Output this shift: Total I/O In: 282.2 [I.V.:282.2] Out: 300 [Urine:300]   Lab Results: Recent Labs    02/01/24 1706 02/01/24 2358 02/02/24 0525  WBC 12.5* 12.6* 10.9*  HGB 8.5* 7.9* 7.3*  PLT 267 266 251  MCV 88.9 93.6 93.7   BMET Recent Labs    02/01/24 0615 02/01/24 0830 02/02/24 0737  NA 130* 136 138  K 5.4* 3.8 4.3  CL 97* 103 102  CO2 24 24 25   GLUCOSE 721* 178* 201*  BUN 15 16 26*  CREATININE 0.69 0.64 0.97  CALCIUM  7.9* 8.1* 7.8*   LFT Recent Labs    01/31/24 0530 02/01/24 0615 02/02/24 0737  PROT 4.3*  --  4.5*  ALBUMIN 1.8* 1.9* 2.0*  AST 13*  --  165*  ALT 14  --  30  ALKPHOS 48  --  57  BILITOT 0.3  --  0.4   PT/INR No results for input(s): INR in the last 72 hours.    Imaging/Other results: CARDIAC CATHETERIZATION Result Date: 02/01/2024 Images from the original result were not included. Coronary angiography and intervention 02/01/2024: LM: Distal 45% stenosis LAD:  Ostially occluded stent with acute stent thrombosis (Culprit vessel) Ramus: 50% in-stent restenosis Lcx: Mild diffuse disease RCA: Not engaged today LVEDP 27 mmHg Successful percutaneous coronary intervention ostial prox LAD        PTCA with serial balloon dilatation, up to 3.0  X 12 mm Marion balloon up to 20 atm Newman JINNY Lawrence, MD     Assessment &Plan  78 year old male with history of CAD, STEMI s/p PCI to LAD 01/17/2024 with subsequent acute stent thrombosis 02/01/2024 s/p urgent PTCA to LAD History of persistent A-fib and heart failure He is currently on IV cangrelor  He received 325 ASA yesterday in Cath Lab and received additional 600 mg ibuprofen  overnight in the ICU  On admission he had hematochezia which was thought to be secondary to ischemic colitis and subsequently had rectal bleeding thought secondary to stercoral ulcer in the setting of rectal tube.  He is having recurrent large-volume hematochezia, initially was dark blood associated with epigastric pain will need to exclude gastroduodenal ulcer Will plan for urgent EGD and flexible sigmoidoscopy to isolate source of bleed and intervene endoscopically if feasible  If continues to have large-volume hematochezia with hemodynamic instability, will need urgent CTA to isolate source and embolization by IR  N.p.o. IV PPI Avoid NSAIDs Continue IV cangrelor  Tap water  enema X 2, 2 hours and 1 hour prior to procedure  He is also on IV milrinone   and IV amiodarone   The risks and benefits as well as alternatives of endoscopic procedure(s) have been discussed and reviewed. All questions answered. The patient agrees to proceed.   This visit required >50 minutes of patient care (this includes precharting, chart review, review of results, face-to-face time used for counseling as well as treatment plan and follow-up.  Family at bedside and the patient were provided an opportunity to ask questions and all were answered. The patient agreed with  the plan and demonstrated an understanding of the instructions.       LOIS Wilkie Mcgee , MD [phone]  [facility]

## 2024-02-02 NOTE — Op Note (Signed)
 [facility] Patient Name: Chase Combs Procedure Date : 02/02/2024 MRN: [id number] Attending MD: Gustav ALONSO Mcgee , MD, [id number] Date of Birth: 1945-09-08 CSN: [id number] Age: 78 Admit Type: Inpatient Procedure:                Flexible Sigmoidoscopy Indications:              Rectal hemorrhage, Hematochezia Providers:                Gustav ALONSO Mcgee, MD, [name] Bare, RN, Coye Bade, Technician Referring MD:              Medicines:                Monitored Anesthesia Care Complications:            No immediate complications. Estimated Blood Loss:     Estimated blood loss was minimal. Procedure:                Pre-Anesthesia Assessment:                           - Prior to the procedure, a History and Physical                            was performed, and patient medications and                            allergies were reviewed. The patient's tolerance of                            previous anesthesia was also reviewed. The risks                            and benefits of the procedure and the sedation                            options and risks were discussed with the patient.                            All questions were answered, and informed consent                            was obtained. Prior Anticoagulants: The patient has                            taken no anticoagulant or antiplatelet agents. ASA                            Grade Assessment: IV - A patient with severe                            systemic disease that is a constant threat to life.  After reviewing the risks and benefits, the patient                            was deemed in satisfactory condition to undergo the                            procedure.                           After obtaining informed consent, the scope was                            passed under direct vision. The [id number]-[id number] ([id number])                            Olympus  endoscope was introduced through the anus                            and advanced to the the left transverse colon. The                            flexible sigmoidoscopy was accomplished without                            difficulty. The patient tolerated the procedure                            well. The quality of the bowel preparation was                            adequate. Scope In: 2:58:53 PM Scope Out: 3:14:53 PM Total Procedure Duration: 0 hours 16 minutes 0 seconds  Findings:      Red blood was found in the rectum, in the sigmoid colon, in the       descending colon and in the transverse colon. Underlying mucosa appeared       normal with no visible source of bleed after extensive wash and suction      A few medium-mouthed and small-mouthed diverticula were found in the       sigmoid colon and descending colon. No active bleeding      A single non-bleeding superficial ulcer was found in the rectum. No       stigmata of recent bleeding were seen. Impression:               - Blood in the rectum, in the sigmoid colon, in the                            descending colon and in the transverse colon. No                            obvious source of bleed                           - Diverticulosis in the sigmoid colon and in the  descending colon. No active bleeding                           - A single superficial ulcer in the rectum. No                            active bleeding Recommendation:           - Clear liquid diet.                           - Bowel prep                           - Will plan for repeat colonsocopy tomorrow if                            continues to have active GI bleed to exclude right                            colon lesion, please order stat CTA if has                            recurrent large volume hematochezia, diverticular                            hemorrhage in the differential                           - Continue present  medications.                           - No ibuprofen , naproxen, or other non-steroidal                            anti-inflammatory drugs. Procedure Code(s):        --- Professional ---                           [phone], Sigmoidoscopy, flexible; diagnostic,                            including collection of specimen(s) by brushing or                            washing, when performed (separate procedure) Diagnosis Code(s):        --- Professional ---                           K62.5, Hemorrhage of anus and rectum                           K92.2, Gastrointestinal hemorrhage, unspecified                           K62.6, Ulcer of anus and rectum  K92.1, Melena (includes Hematochezia)                           K57.30, Diverticulosis of large intestine without                            perforation or abscess without bleeding CPT copyright 2022 [employer]. All rights reserved. The codes documented in this report are preliminary and upon coder review may  be revised to meet current compliance requirements. Colsen Modi V. Denita Lun, MD 02/02/2024 3:46:26 PM This report has been signed electronically. Number of Addenda: 0

## 2024-02-02 NOTE — Progress Notes (Signed)
 NAME:  Chase Combs, MRN:  [id number], DOB:  21-Sep-1945, LOS: 11 ADMISSION DATE:  01/22/2024, CONSULTATION DATE:  01/22/24 REFERRING MD:  EDP, CHIEF COMPLAINT:  recurrent STEMI  History of Present Illness:  78 year old man w/ hx of ischemic cardiomyopathy and very recent LAD stent placement for NSTEMI discharged 8/27 (no d/c summary so unclear) with progressive nausea vomiting with CT images concerning for ischemic colitis.  Feeling weak and unwell since discharge 3 days ago.  Progressive nausea and vomiting not keeping down his antiplatelet medicines.  CT images reveal intestinal air in the wall of the intestine as well as concerning findings of ileus.  His lactate was elevated.  He was volume resuscitated.  Remained hypotensive.  Placed on broad-spectrum antibiotics he received vancomycin  Zosyn  doxycycline and Flagyl .  Labs notable for acute renal failure.  Anion gap metabolic acidosis.  Leukopenia, lymphopenia.  Had previously been sent out of ICU. Had ileus requiring TPN, now on clears. Had GIB requiring 2 units pRBC in the past few days, never able to be scoped due to need for need for ongoing AP therapy. This morning his cangrelor  was d/c and plavix  was started; several hours later he developed sudden onset shoulder pain, diaphoresis. EKG with anterior ST elevations. Taken for [facility]. Transferring to the ICU post-procedure. Afib with RVR in LHC, no on amiodarone . LVEDP 28. Heparin  1200 units during cath.  Pertinent  Medical History  CAD, CHF  Significant Hospital Events: Including procedures, antibiotic start and stop dates in addition to other pertinent events   8/30 admitted with pneumatosis intestinalis/ileus.  General surgery recommend conservative management 9/8 1 unit pRBC 9/9 1 unit pRBC, switched off kangrelor to plavix > recurrent STEMI with in-stent stenosis. Started milrinone  for low coox, high CVP & LVEDP after.  Interim History / Subjective:  Bloody BM this morning. Has had  epigastric pain today.   Objective    Blood pressure 96/63, pulse 92, temperature 97.7 F (36.5 C), temperature source Oral, resp. rate 17, height 6' (1.829 m), weight 102.5 kg, SpO2 98%. CVP:  [2 mmHg-53 mmHg] 4 mmHg      Intake/Output Summary (Last 24 hours) at 02/02/2024 0757 Last data filed at 02/02/2024 0500 Gross per 24 hour  Intake 2690.29 ml  Output 2250 ml  Net 440.29 ml   Filed Weights   01/31/24 0401 02/01/24 0500 02/02/24 0421  Weight: 96.8 kg 95.9 kg 102.5 kg    Examination: General: ill appearing elderly man lying in bed in NAD HEENT: [place]/AT, eyes anicteric Neuro: awake, alert, moving all extremities Chest:  breathing comfortably on Carl, reduced basilar breath sounds. CTA anteriorly.  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 -con't cangrelor  with ongoing evidence of GIB today -statin -holding aspirin  -tele monitoring -appreciate cardiology's management -serial coox, con't milrinone  -avoid CCB due to advanced heart failure  Afib with RVR- new onset this admission; more rate controlled after trasnfusions today -con't amiodarone  -correct/ address anemia -con't holding full dose AC due to active GIB  Intestinal pneumatosis concerning for ischemic vs infectious colitis: Severe arthrosclerotic disease as evidenced by severe CAD Severe  ileus -con't TPN and PPI -con't cangrelor  -con't ceftriaxone  and flagyl   ABLA, GIB of unknown origin-- potentially due to rectal tube. With rise in BUN & 2g drop overnight in Hb, worry about upper source. EGD with  nonbleeding esophageal and gastric ulcers. -don't replace rectal tube -EGD & flex sig today; appreciate GI's management -PPI BID, added H2blocker -hold DVT prophylaxis and full dose AC -unfortunately can't stop cangreolor -avoid NSAIDS -CBC q8h overnight; transfuse for Hb <7 or hemodynamically significant bleeding  This patient is critically ill with multiple organ system failure which requires frequent high complexity decision making, assessment, support, evaluation, and titration of therapies. This was completed through the application of advanced monitoring technologies and extensive interpretation of multiple databases. During this encounter critical care time was devoted to patient care services described in this note for 37 minutes.   [name], DO 02/02/24 8:07 PM [facility]  For contact information, see Amion. If no response to pager, please call PCCM consult pager. After hours, 7PM- 7AM, please call Elink.

## 2024-02-02 NOTE — Interval H&P Note (Signed)
 History and Physical Interval Note:  02/02/2024 1:29 PM  Chase Combs  has presented today for surgery, with the diagnosis of GI hemorrhage, epigastric pain, large volume rectal bleeding.  The various methods of treatment have been discussed with the patient and family. After consideration of risks, benefits and other options for treatment, the patient has consented to  Procedure(s): EGD (ESOPHAGOGASTRODUODENOSCOPY) (N/A) SIGMOIDOSCOPY, FLEXIBLE (N/A) as a surgical intervention.  The patient's history has been reviewed, patient examined, no change in status, stable for surgery.  I have reviewed the patient's chart and labs.  Questions were answered to the patient's satisfaction.     Cj Edgell

## 2024-02-02 NOTE — Progress Notes (Signed)
 Patient Name: Chase Combs Date of Encounter: 02/02/2024 [facility] Cardiologist: Alm Clay, MD   Interval Summary  .    S/p  emergent PTCA to LAD after acute stent thrombosis 9/92025 afternoon Lower chest/epigastric pain overnight. This pain was different from chest/right shoulder pain he had at the time of STEMI 02/01/2024 afternoon. Received 600 mg ibuprofen  overnight. Hb 9.2-->8.5-->7.9-->7.3 BM this morning with dark red mucous and blood Mixed venous O2 50%  Vital Signs .    Vitals:   02/02/24 0630 02/02/24 0700 02/02/24 0740 02/02/24 0800  BP: 96/63 (!) 131/121  110/74  Pulse: 92 87  81  Resp: 17 (!) 23  (!) 38  Temp:   97.7 F (36.5 C)   TempSrc:   Oral   SpO2: 98% 97%  97%  Weight:      Height:        Intake/Output Summary (Last 24 hours) at 02/02/2024 9082 Last data filed at 02/02/2024 0800 Gross per 24 hour  Intake 3098.98 ml  Output 2250 ml  Net 848.98 ml      02/02/2024    4:21 AM 02/01/2024    5:00 AM 01/31/2024    4:01 AM  Last 3 Weights  Weight (lbs) 225 lb 15.5 oz 211 lb 6.7 oz 213 lb 6.5 oz  Weight (kg) 102.5 kg 95.9 kg 96.8 kg      Telemetry/ECG    02/02/2024 - Personally Reviewed Afib with controlled ventricular rate this morning  EKG 02/02/2024: Atrial fibrillation with rapid ventricular response Left axis deviation Right bundle branch block Anteroseptal infarct , age undetermined T wave abnormality, consider lateral ischemia  Physical Exam .   Physical Exam Vitals and nursing note reviewed.  Constitutional:      General: He is not in acute distress. Neck:     Vascular: No JVD.  Cardiovascular:     Rate and Rhythm: Tachycardia present. Rhythm irregular.     Heart sounds: Normal heart sounds. No murmur heard. Pulmonary:     Effort: Pulmonary effort is normal.     Breath sounds: Normal breath sounds. No wheezing or rales.  Abdominal:     Tenderness: There is abdominal tenderness (Epigastric).  Musculoskeletal:      Right lower leg: No edema.     Left lower leg: No edema.      Assessment & Plan .     78 year old male with hypertension, hyperlipidemia, OSA, CAD (LAD PCI 01/17/2024), complicated by recurrent GI bleed and subsequent acute stent thrombosis 02/01/2024 requiring urgent PTCA to LAD, HFrEF, persistent A-fib  CAD: Multivessel moderate CAD + obstructive prox-mid LAD disease-->LAD PCI 01/17/2024 Acute stent thrombosis, likely due to inadequate platelet inhibition in the setting of recurrent GI bleed. He is now on IV cangrelor , received ASA 325 mg on 9/9 in caht lab Unable to transition to oral Brilinta  given recurrent GI bleed this morning. Continue IV cangrelor  pending further management of GI bleeding. Scheduled to receive 2 units of PRBC transfusion.  HFrEF/cardiogenic shock: Secondary to STEMI as well as recurrent GI bleeding. Currently on IV milrinone  as per heart failure team recommendations. Hold oral heart failure medications for now. Appears stable this morning with increasing mixed venous O2 sats, MAP >65, A-fib rate in low 100s.  Recurrent GI bleed: Ischemic colitis, rectal ulcers thought to be initial cause. Bleeding had stabilized until 02/02/2024 morning when he had further drop in hemoglobin and bowel movement with dark red blood. Currently on IV Protonix , getting Maalox and Carafate  this  morning with some concern about epigastric pain and question of ulcers. Will touch base with GI for further recommendations, and check if he would benefit from endoscopic evaluation. Unfortunately, holding all antiplatelet therapy is not an option given his risk of recurrent stent thrombosis.  Persistent atrial fibrillation: Rate controlled.  Continue IV amiodarone .  Unable to anticoagulate given recurrent GI bleed at this time.  Multidisciplinary discussion with critical care, heart failure team, as well as GI.  CRITICAL CARE Performed by: Newman Lawrence   Total critical care time: 40  minutes   Critical care time was exclusive of separately billable procedures and treating other patients.   Critical care was necessary to treat or prevent imminent or life-threatening deterioration.   Critical care was time spent personally by me on the following activities: development of treatment plan with patient and/or surrogate as well as nursing, discussions with consultants, evaluation of patient's response to treatment, examination of patient, obtaining history from patient or surrogate, ordering and performing treatments and interventions, ordering and review of laboratory studies, ordering and review of radiographic studies, pulse oximetry and re-evaluation of patient's condition.     For questions or updates, please contact  [facility] Please consult [email or web address] for contact info under        Signed, Newman JINNY Lawrence, MD

## 2024-02-02 NOTE — Progress Notes (Addendum)
 [facility] ROUNDING NOTE   Subjective: Patient had an episode of epigastric and lower chest pain last night, was given 600 mg of ibuprofen  overnight Epigastric pain is improving right now He had 1 dark burgundy appearing bowel movement last night, 2 additional large bloody bowel movements this morning  Hemoglobin trended down from 9-7   Objective: Vital signs in last 24 hours: Temp:  [97.7 F (36.5 C)-98.2 F (36.8 C)] 97.7 F (36.5 C) (09/10 0900) Pulse Rate:  [60-145] 94 (09/10 0900) Resp:  [12-38] 28 (09/10 0900) BP: (82-133)/(48-121) 106/67 (09/10 0900) SpO2:  [78 %-100 %] 97 % (09/10 0900) Weight:  [102.5 kg] 102.5 kg (09/10 0421) Last BM Date : 02/01/24 General: NAD Lungs: Bilateral clear Heart: Irregular heart rate, tachycardic, no murmurs Abdomen: Soft, no distention no tenderness or rebound Ext: No edema    Intake/Output from previous day: 09/09 0701 - 09/10 0700 In: 2961.3 [I.V.:2861.3; IV Piggyback:100] Out: 2250 [Urine:2250] Intake/Output this shift: Total I/O In: 282.2 [I.V.:282.2] Out: 300 [Urine:300]   Lab Results: Recent Labs    02/01/24 1706 02/01/24 2358 02/02/24 0525  WBC 12.5* 12.6* 10.9*  HGB 8.5* 7.9* 7.3*  PLT 267 266 251  MCV 88.9 93.6 93.7   BMET Recent Labs    02/01/24 0615 02/01/24 0830 02/02/24 0737  NA 130* 136 138  K 5.4* 3.8 4.3  CL 97* 103 102  CO2 24 24 25   GLUCOSE 721* 178* 201*  BUN 15 16 26*  CREATININE 0.69 0.64 0.97  CALCIUM  7.9* 8.1* 7.8*   LFT Recent Labs    01/31/24 0530 02/01/24 0615 02/02/24 0737  PROT 4.3*  --  4.5*  ALBUMIN 1.8* 1.9* 2.0*  AST 13*  --  165*  ALT 14  --  30  ALKPHOS 48  --  57  BILITOT 0.3  --  0.4   PT/INR No results for input(s): INR in the last 72 hours.    Imaging/Other results: CARDIAC CATHETERIZATION Result Date: 02/01/2024 Images from the original result were not included. Coronary angiography and intervention 02/01/2024: LM: Distal 45% stenosis LAD:  Ostially occluded stent with acute stent thrombosis (Culprit vessel) Ramus: 50% in-stent restenosis Lcx: Mild diffuse disease RCA: Not engaged today LVEDP 27 mmHg Successful percutaneous coronary intervention ostial prox LAD        PTCA with serial balloon dilatation, up to 3.0  X 12 mm [place] balloon up to 20 atm Newman JINNY Lawrence, MD     Assessment &Plan  78 year old male with history of CAD, STEMI s/p PCI to LAD 01/17/2024 with subsequent acute stent thrombosis 02/01/2024 s/p urgent PTCA to LAD History of persistent A-fib and heart failure He is currently on IV cangrelor  He received 325 ASA yesterday in Cath Lab and received additional 600 mg ibuprofen  overnight in the ICU  On admission he had hematochezia which was thought to be secondary to ischemic colitis and subsequently had rectal bleeding thought secondary to stercoral ulcer in the setting of rectal tube.  He is having recurrent large-volume hematochezia, initially was dark blood associated with epigastric pain will need to exclude gastroduodenal ulcer Will plan for urgent EGD and flexible sigmoidoscopy to isolate source of bleed and intervene endoscopically if feasible  If continues to have large-volume hematochezia with hemodynamic instability, will need urgent CTA to isolate source and embolization by IR  N.p.o. IV PPI Avoid NSAIDs Continue IV cangrelor  Tap water  enema X 2, 2 hours and 1 hour prior to procedure  He is also on IV milrinone   and IV amiodarone   The risks and benefits as well as alternatives of endoscopic procedure(s) have been discussed and reviewed. All questions answered. The patient agrees to proceed.   This visit required >50 minutes of patient care (this includes precharting, chart review, review of results, face-to-face time used for counseling as well as treatment plan and follow-up.  Family at bedside and the patient were provided an opportunity to ask questions and all were answered. The patient agreed with  the plan and demonstrated an understanding of the instructions.       LOIS Wilkie Mcgee , MD [phone]  [facility]

## 2024-02-02 NOTE — Progress Notes (Signed)
 eLink Physician-Brief Progress Note Patient Name: Chase Combs DOB: 04-11-1946 MRN: [id number]   Date of Service  02/02/2024  HPI/Events of Note  Patient with atrial fib with RVR (rate 130-150) despite being on Amiodarone  gtt. BP 112/89, (MAP 94), HR 99.  eICU Interventions  Diltiazem  gtt ordered.        Dhanvin Szeto U Branndon Tuite 02/02/2024, 12:25 AM

## 2024-02-02 NOTE — Progress Notes (Signed)
 Advanced Heart Failure Rounding Note  Cardiologist: Alm Clay, MD  Chief Complaint: Abdominal Discomfort Subjective:   9/9 PTCA LAD. A fib RVR. Placed on Amio drip. CO-OX 44 %. Started on milrinone  0.25 mcg.   Overnight placed on diltiazem  drip and given 600 mg Ibuprophen per [facility] MD.   Had large bloody BM this morning. Complaining of abdominal discomfort 4/10. Denies shortness of breath.    Objective:   Weight Range: 102.5 kg Body mass index is 30.65 kg/m.   Vital Signs:   Temp:  [97.7 F (36.5 C)-98.2 F (36.8 C)] 97.7 F (36.5 C) (09/10 0740) Pulse Rate:  [60-145] 92 (09/10 0630) Resp:  [12-37] 17 (09/10 0630) BP: (82-133)/(48-97) 96/63 (09/10 0630) SpO2:  [78 %-100 %] 98 % (09/10 0630) Weight:  [102.5 kg] 102.5 kg (09/10 0421) Last BM Date : 02/01/24  Weight change: Filed Weights   01/31/24 0401 02/01/24 0500 02/02/24 0421  Weight: 96.8 kg 95.9 kg 102.5 kg    Intake/Output:   Intake/Output Summary (Last 24 hours) at 02/02/2024 0818 Last data filed at 02/02/2024 0500 Gross per 24 hour  Intake 2690.29 ml  Output 2250 ml  Net 440.29 ml    CVP 17-18  Physical Exam    General:  Appears weak  No resp difficulty Neck: JVP to jaw  Cor: Irregular rate & rhythm.  Lungs: clear Abdomen: soft, nontender, distended.  Extremities: R and LLE 2+  edema Neuro: alert & oriented x3 Skin: Red rash with satellite lesions. groin   Telemetry  A fib RVR   EKG   N/A  Labs    CBC Recent Labs    01/31/24 0530 01/31/24 2300 02/01/24 0458 02/01/24 1706 02/01/24 2358 02/02/24 0525  WBC 6.9  --  9.8   < > 12.6* 10.9*  NEUTROABS 5.5  --  8.1*  --   --   --   HGB 7.5*   < > 9.2*   < > 7.9* 7.3*  HCT 23.3*   < > 27.5*   < > 24.7* 22.2*  MCV 89.3  --  87.3   < > 93.6 93.7  PLT 246  --  291   < > 266 251   < > = values in this interval not displayed.   Basic Metabolic Panel Recent Labs    90/90/74 0458 02/01/24 0615 02/01/24 0830 02/02/24 0737  NA   --  130* 136 138  K  --  5.4* 3.8 4.3  CL  --  97* 103 102  CO2  --  24 24 25   GLUCOSE  --  721* 178* 201*  BUN  --  15 16 26*  CREATININE  --  0.69 0.64 0.97  CALCIUM   --  7.9* 8.1* 7.8*  MG 2.3  --   --  1.8  PHOS  --  5.3* 3.6  --    Liver Function Tests Recent Labs    01/31/24 0530 02/01/24 0615 02/02/24 0737  AST 13*  --  165*  ALT 14  --  30  ALKPHOS 48  --  57  BILITOT 0.3  --  0.4  PROT 4.3*  --  4.5*  ALBUMIN 1.8* 1.9* 2.0*   No results for input(s): LIPASE, AMYLASE in the last 72 hours. Cardiac Enzymes No results for input(s): CKTOTAL, CKMB, CKMBINDEX, TROPONINI in the last 72 hours.  BNP: BNP (last 3 results) Recent Labs    01/11/24 0539 01/13/24 0537 01/22/24 1243  BNP 842.8* 1,747.3* 220.4*  ProBNP (last 3 results) Recent Labs    01/07/24 2301  PROBNP 15,245.0*     D-Dimer No results for input(s): DDIMER in the last 72 hours. Hemoglobin A1C No results for input(s): HGBA1C in the last 72 hours. Fasting Lipid Panel Recent Labs    01/31/24 0530  TRIG 101   Thyroid  Function Tests No results for input(s): TSH, T4TOTAL, T3FREE, THYROIDAB in the last 72 hours.  Invalid input(s): FREET3  Other results:   Imaging    CARDIAC CATHETERIZATION Result Date: 02/01/2024 Images from the original result were not included. Coronary angiography and intervention 02/01/2024: LM: Distal 45% stenosis LAD: Ostially occluded stent with acute stent thrombosis (Culprit vessel) Ramus: 50% in-stent restenosis Lcx: Mild diffuse disease RCA: Not engaged today LVEDP 27 mmHg Successful percutaneous coronary intervention ostial prox LAD        PTCA with serial balloon dilatation, up to 3.0  X 12 mm [place] balloon up to 20 atm Manish JINNY Lawrence, MD    Medications:     Scheduled Medications:  sodium chloride    Intravenous Once   Chlorhexidine  Gluconate Cloth  6 each Topical Q0600   empagliflozin   10 mg Oral Daily   feeding supplement  237  mL Oral TID BM   furosemide   80 mg Intravenous BID   Gerhardt's butt cream   Topical BID   insulin  aspart  0-9 Units Subcutaneous Q4H   metroNIDAZOLE   500 mg Oral Q12H   pantoprazole  (PROTONIX ) IV  40 mg Intravenous Q12H   sodium chloride  flush  10-40 mL Intracatheter Q12H   sodium chloride  flush  3 mL Intravenous Q12H   spironolactone   25 mg Oral Daily    Infusions:  sodium chloride      amiodarone  60 mg/hr (02/02/24 0702)   cangrelor  (KENGREAL ) 50 mg in sodium chloride  0.9 % 250 mL (0.2 mg/mL) infusion 0.75 mcg/kg/min (02/02/24 0732)   cefTRIAXone  (ROCEPHIN )  IV Stopped (02/01/24 1641)   milrinone  0.25 mcg/kg/min (02/02/24 0500)   ondansetron  (ZOFRAN ) IV     TPN ADULT (ION) 80 mL/hr at 02/02/24 0500    PRN Medications: sodium chloride , acetaminophen , ALPRAZolam , HYDROmorphone  (DILAUDID ) injection, iohexol , melatonin, ondansetron  (ZOFRAN ) IV, mouth rinse, polyethylene glycol, sodium chloride  flush, sodium chloride  flush    Patient Profile   Chase Combs is a 78 year old with a history of ICM, and chronic biventricular HFrEF.      Readmitted 01/22/24 with abdominal pain./nausea/vomiting. Hospital complicated by ischemic colitis and septic shock/GI bleed.    Assessment/Plan  CAD, Instent Stenosis 01/17/24- PCI/Des LAD  9/9 Cangrelor  stopped given Plavix  load. Later developed acute chest pain. Returned urgently to cath lab. PTCA LAD. - Plavix  Genetic Testing collected.  - Chest sore  -Continue high intensity statin -Continue Cangrelor  - Discussed anticoagulation plan with Pharmacy , CCM, Dr Zenaida.  - Off aspirin  with GI bleed.    New A fib RVR - Uncontrolled rate.  Continue amio drip.  - Continue SCDs.  - Unable to restart eliquis with  starting eliquis in few days if no further bleeding occurs.    A/C HFrEF, ICM   01/13/24 Echo EF  20-25% RV moderately reduced. Repeat ECHO   - CVP 17-18. Continue lasix  80 mg twice a day.  -Check CO-OX. Check Lactic Acid.  - Continue  milrinone  0.25 mcg.   - Stop SGLT2i with fungal rash.  -Continue spironolactone  25 mg dialy  -Follow renal function.    Anemia in the stting of GI Bleed -Possible bleeding from rectal tube. Off  aspirin   -Transfused 9/8 with appropriate rise.  -Today he had large bloody BM.Given 600 mg Ibuprophen this morning.  - Hgb trending down 7.3. Give 2 UPRBCs.  -Continue IV Protonix . Add pepcid  and carafate .  - May need CT abd to assess for bleeding source.   - Will need to ask GI  come back by.    Septic Shock  On admit, ischemic colitis. Treated with antibiotics. Resolved. GI/Gen Surgery signed off.    DNR    Watch closely.  Length of Stay: 11  Pattye Meda, NP  02/02/2024, 8:18 AM  Advanced Heart Failure Team Pager [phone] (M-F; 7a - 5p)  Please contact [facility] for night-coverage after hours (5p -7a ) and weekends on [email or web address]

## 2024-02-02 NOTE — Anesthesia Postprocedure Evaluation (Signed)
 Anesthesia Post Note  Patient: Chase Combs  Procedure(s) Performed: EGD (ESOPHAGOGASTRODUODENOSCOPY) SIGMOIDOSCOPY, FLEXIBLE     Patient location during evaluation: PACU Anesthesia Type: MAC Level of consciousness: awake and alert Pain management: pain level controlled Vital Signs Assessment: post-procedure vital signs reviewed and stable Respiratory status: spontaneous breathing, nonlabored ventilation, respiratory function stable and patient connected to nasal cannula oxygen Cardiovascular status: stable and blood pressure returned to baseline Postop Assessment: no apparent nausea or vomiting Anesthetic complications: no   No notable events documented.  Last Vitals:  Vitals:   02/02/24 1845 02/02/24 1900  BP: 117/85 (!) 113/91  Pulse: (!) 164 (!) 106  Resp: (!) 24 (!) 30  Temp:    SpO2: 91% 93%    Last Pain:  Vitals:   02/02/24 1615  TempSrc: Axillary  PainSc:                  Thom JONELLE Peoples

## 2024-02-02 NOTE — Op Note (Signed)
 [facility] Patient Name: Chase Combs Procedure Date : 02/02/2024 MRN: [id number] Attending MD: Gustav ALONSO Mcgee , MD, [id number] Date of Birth: 1946-04-12 CSN: [id number] Age: 78 Admit Type: Inpatient Procedure:                Upper GI endoscopy Indications:              Active gastrointestinal bleeding, Suspected upper                            gastrointestinal bleeding, Gastrointestinal                            bleeding of unknown origin, Epigastric abdominal                            pain Providers:                Gustav ALONSO Mcgee, MD, [name] Bare, RN, Coye Bade, Technician Referring MD:              Medicines:                Monitored Anesthesia Care Complications:            No immediate complications. Estimated Blood Loss:     Estimated blood loss was minimal. Procedure:                Pre-Anesthesia Assessment:                           - Prior to the procedure, a History and Physical                            was performed, and patient medications and                            allergies were reviewed. The patient's tolerance of                            previous anesthesia was also reviewed. The risks                            and benefits of the procedure and the sedation                            options and risks were discussed with the patient.                            All questions were answered, and informed consent                            was obtained. Prior Anticoagulants: The patient has                            taken no anticoagulant or antiplatelet  agents. ASA                            Grade Assessment: IV - A patient with severe                            systemic disease that is a constant threat to life.                            After reviewing the risks and benefits, the patient                            was deemed in satisfactory condition to undergo the                             procedure.                           After obtaining informed consent, the endoscope was                            passed under direct vision. Throughout the                            procedure, the patient's blood pressure, pulse, and                            oxygen saturations were monitored continuously. The                            [id number]-H190 ([id number]) Olympus endoscope was introduced                            through the mouth, and advanced to the second part                            of duodenum. The upper GI endoscopy was                            accomplished without difficulty. The patient                            tolerated the procedure well. Scope In: Scope Out: Findings:      One superficial esophageal ulcer with no bleeding and no stigmata of       recent bleeding was found 35 to 36 cm from the incisors. The lesion was       4 mm in largest dimension.      LA Grade C (one or more mucosal breaks continuous between tops of 2 or       more mucosal folds, less than 75% circumference) esophagitis with no       bleeding was found 34 to 38 cm from the incisors.      The stomach was normal.      The cardia and gastric fundus were normal on retroflexion.  A few erosions without bleeding were found in the first portion of the       duodenum. Impression:               - Esophageal ulcer with no bleeding and no stigmata                            of recent bleeding.                           - LA Grade C reflux esophagitis with no bleeding.                           - Normal stomach.                           - Duodenal erosions without bleeding.                           - No specimens collected. Recommendation:           - Patient has a contact number available for                            emergencies. The signs and symptoms of potential                            delayed complications were discussed with the                            patient. Return to normal  activities tomorrow.                            Written discharge instructions were provided to the                            patient.                           - Clears liquid diet                           - Continue present medications.                           - IV Pantoprazole  40mg  BID                           - See the other procedure note for documentation of                            additional recommendations. Procedure Code(s):        --- Professional ---                           [phone], Esophagogastroduodenoscopy, flexible,  transoral; diagnostic, including collection of                            specimen(s) by brushing or washing, when performed                            (separate procedure) Diagnosis Code(s):        --- Professional ---                           K22.10, Ulcer of esophagus without bleeding                           K21.00, Gastro-esophageal reflux disease with                            esophagitis, without bleeding                           K26.9, Duodenal ulcer, unspecified as acute or                            chronic, without hemorrhage or perforation                           K92.2, Gastrointestinal hemorrhage, unspecified                           R10.13, Epigastric pain CPT copyright 2022 American Medical Association. All rights reserved. The codes documented in this report are preliminary and upon coder review may  be revised to meet current compliance requirements. Andre Gallego V. Corayma Cashatt, MD 02/02/2024 3:32:08 PM This report has been signed electronically. Number of Addenda: 0

## 2024-02-02 NOTE — Transfer of Care (Signed)
 Immediate Anesthesia Transfer of Care Note  Patient: Chase Combs  Procedure(s) Performed: EGD (ESOPHAGOGASTRODUODENOSCOPY) SIGMOIDOSCOPY, FLEXIBLE  Patient Location: PACU  Anesthesia Type:MAC  Level of Consciousness: awake  Airway & Oxygen Therapy: Patient Spontanous Breathing  Post-op Assessment: Report given to RN  Post vital signs: stable  Last Vitals:  Vitals Value Taken Time  BP 107/66 02/02/24 15:30  Temp    Pulse    Resp    SpO2    Vitals shown include unfiled device data.  Last Pain:  Vitals:   02/02/24 1345  TempSrc: Temporal  PainSc: 0-No pain      Patients Stated Pain Goal: 0 (02/02/24 0417)  Complications: No notable events documented.

## 2024-02-02 NOTE — Progress Notes (Signed)
 PHARMACY - TOTAL PARENTERAL NUTRITION CONSULT NOTE  Indication: Prolonged ileus  Patient Measurements: Height: 6' (182.9 cm) Weight: 102.5 kg (225 lb 15.5 oz) IBW/kg (Calculated) : 77.6 TPN AdjBW (KG): 91.1 Body mass index is 30.65 kg/m.  Assessment:  29 YOM presented on 8/30 with abdominal pain, found to have adynamic ileus and possible ischemic colitis.  Patient stated his last meal was on 8/30 and was likely losing weight PTA, but unable to quantify.  He was started on a clear liquid diet on 9/3 PM and hasn't been able to advance.  Pharmacy consulted to manage TPN.  He is at risk for refeeding syndrome.   9/9 AM update: He will receive lasix  40 mg IV x2 doses today [9/9]  Glucose / Insulin : no hx DM - CBGs variable (173-250 s) Used 10 units SSI charted in the past 24 hrs + empagliflozin  10 mg started 9/9 Electrolytes: Na 138, K 4.3 (goal >/= 4, has required a significant amount of KCL runs since admission, up to 20 runs on day one), Cl 102, Mag 1.8 (goal >/= 2), Phos 3.6 , Ca 7.8 [CoCa 9.4] Renal: SCr 0.97, BUN 26 Hepatic: AST 165 / ALT, Alk Phos, Tbili wnl, TG 101, albumin 2.0 Intake / Output; MIVF: UOP 0.9 mL/kg/hr, LBM 9/9 GI Imaging: 9/5 KUB: diffuse gaseous distension of entire colon  GI Surgeries / Procedures: none since TPN initiation   Central access: PICC placed 01/24/24 TPN start date: 01/28/24  Nutritional Goals: Goal TPN rate is 80 mL/hr (provides 100g AA and 1965 kCal per day)  RD Estimated Needs Total Energy Estimated Needs: 1900-2100 kcals Total Protein Estimated Needs: 95-110g Total Fluid Estimated Needs: 1.9-2.1L/day  Current Nutrition:  NPO and TPN   Plan:  Continue TPN to 80 mL/hr at 1800 (goal rate 80 ml/hr) to provide 100% of estimated needs. Electrolytes in TPN: Na 25 mEq/L, K 50 mEq/L, Ca 6 mEq/L, Mg 12 mEq/L, Phos 15 mmol/L , Cl:Ac 1:1  Continue standard MVI and trace elements to TPN Continue SSI sensitive Q4H  Mag 2 g iv x1 Monitor TPN labs  on Mon/Thurs F/up on advancing diet and discontinuing TPN when clinically appropriate    Francisca Harbuck BS, PharmD, BCPS Clinical Pharmacist 02/02/2024 7:13 AM  Contact: [phone] after 3 PM

## 2024-02-02 NOTE — Progress Notes (Signed)
   CO-OX 52%. On Milrinone  0.25 mcg. CVP 16.  Continue to diurese with IV lasix .   Needs to complete 2nd unit of blood. Check CBC once blood completed.   Tahlia Deamer NP-C  5:11 PM

## 2024-02-03 DIAGNOSIS — K2210 Ulcer of esophagus without bleeding: Secondary | ICD-10-CM

## 2024-02-03 DIAGNOSIS — K921 Melena: Secondary | ICD-10-CM

## 2024-02-03 DIAGNOSIS — R570 Cardiogenic shock: Secondary | ICD-10-CM | POA: Diagnosis not present

## 2024-02-03 DIAGNOSIS — R739 Hyperglycemia, unspecified: Secondary | ICD-10-CM

## 2024-02-03 DIAGNOSIS — I2510 Atherosclerotic heart disease of native coronary artery without angina pectoris: Secondary | ICD-10-CM | POA: Diagnosis not present

## 2024-02-03 DIAGNOSIS — K559 Vascular disorder of intestine, unspecified: Secondary | ICD-10-CM | POA: Diagnosis not present

## 2024-02-03 DIAGNOSIS — I2101 ST elevation (STEMI) myocardial infarction involving left main coronary artery: Secondary | ICD-10-CM | POA: Diagnosis not present

## 2024-02-03 DIAGNOSIS — I5022 Chronic systolic (congestive) heart failure: Secondary | ICD-10-CM | POA: Diagnosis not present

## 2024-02-03 DIAGNOSIS — K279 Peptic ulcer, site unspecified, unspecified as acute or chronic, without hemorrhage or perforation: Secondary | ICD-10-CM

## 2024-02-03 DIAGNOSIS — I4819 Other persistent atrial fibrillation: Secondary | ICD-10-CM | POA: Diagnosis not present

## 2024-02-03 DIAGNOSIS — D62 Acute posthemorrhagic anemia: Secondary | ICD-10-CM | POA: Diagnosis not present

## 2024-02-03 LAB — BPAM RBC
Blood Product Expiration Date: 202510042359
Blood Product Expiration Date: 202510042359
ISSUE DATE / TIME: 202509101140
ISSUE DATE / TIME: 202509101617
Unit Type and Rh: 5100
Unit Type and Rh: 5100

## 2024-02-03 LAB — CBC
HCT: 27.8 % — ABNORMAL LOW (ref 39.0–52.0)
HCT: 28 % — ABNORMAL LOW (ref 39.0–52.0)
HCT: 28.5 % — ABNORMAL LOW (ref 39.0–52.0)
Hemoglobin: 9 g/dL — ABNORMAL LOW (ref 13.0–17.0)
Hemoglobin: 9 g/dL — ABNORMAL LOW (ref 13.0–17.0)
Hemoglobin: 9.3 g/dL — ABNORMAL LOW (ref 13.0–17.0)
MCH: 27.8 pg (ref 26.0–34.0)
MCH: 28.4 pg (ref 26.0–34.0)
MCH: 29.8 pg (ref 26.0–34.0)
MCHC: 31.6 g/dL (ref 30.0–36.0)
MCHC: 32.4 g/dL (ref 30.0–36.0)
MCHC: 33.2 g/dL (ref 30.0–36.0)
MCV: 87.7 fL (ref 80.0–100.0)
MCV: 88 fL (ref 80.0–100.0)
MCV: 89.7 fL (ref 80.0–100.0)
Platelets: 270 K/uL (ref 150–400)
Platelets: 275 K/uL (ref 150–400)
Platelets: 325 K/uL (ref 150–400)
RBC: 3.12 MIL/uL — ABNORMAL LOW (ref 4.22–5.81)
RBC: 3.17 MIL/uL — ABNORMAL LOW (ref 4.22–5.81)
RBC: 3.24 MIL/uL — ABNORMAL LOW (ref 4.22–5.81)
RDW: 18.7 % — ABNORMAL HIGH (ref 11.5–15.5)
RDW: 18.9 % — ABNORMAL HIGH (ref 11.5–15.5)
RDW: 19 % — ABNORMAL HIGH (ref 11.5–15.5)
WBC: 10.3 K/uL (ref 4.0–10.5)
WBC: 10.5 K/uL (ref 4.0–10.5)
WBC: 10.7 K/uL — ABNORMAL HIGH (ref 4.0–10.5)
nRBC: 0.7 % — ABNORMAL HIGH (ref 0.0–0.2)
nRBC: 0.7 % — ABNORMAL HIGH (ref 0.0–0.2)
nRBC: 0.7 % — ABNORMAL HIGH (ref 0.0–0.2)

## 2024-02-03 LAB — GLUCOSE, CAPILLARY
Glucose-Capillary: 159 mg/dL — ABNORMAL HIGH (ref 70–99)
Glucose-Capillary: 209 mg/dL — ABNORMAL HIGH (ref 70–99)
Glucose-Capillary: 199 mg/dL — ABNORMAL HIGH (ref 70–99)
Glucose-Capillary: 235 mg/dL — ABNORMAL HIGH (ref 70–99)
Glucose-Capillary: 172 mg/dL — ABNORMAL HIGH (ref 70–99)
Glucose-Capillary: 194 mg/dL — ABNORMAL HIGH (ref 70–99)

## 2024-02-03 LAB — TYPE AND SCREEN
ABO/RH(D): O POS
Antibody Screen: NEGATIVE
Unit division: 0
Unit division: 0

## 2024-02-03 LAB — COMPREHENSIVE METABOLIC PANEL WITH GFR
ALT: 24 U/L (ref 0–44)
AST: 52 U/L — ABNORMAL HIGH (ref 15–41)
Albumin: 1.9 g/dL — ABNORMAL LOW (ref 3.5–5.0)
Alkaline Phosphatase: 58 U/L (ref 38–126)
Anion gap: 15 (ref 5–15)
BUN: 32 mg/dL — ABNORMAL HIGH (ref 8–23)
CO2: 22 mmol/L (ref 22–32)
Calcium: 7.5 mg/dL — ABNORMAL LOW (ref 8.9–10.3)
Chloride: 94 mmol/L — ABNORMAL LOW (ref 98–111)
Creatinine, Ser: 1.22 mg/dL (ref 0.61–1.24)
GFR, Estimated: >60 mL/min (ref >60)
Glucose, Bld: 409 mg/dL — ABNORMAL HIGH (ref 70–99)
Potassium: 3.8 mmol/L (ref 3.5–5.1)
Sodium: 131 mmol/L — ABNORMAL LOW (ref 135–145)
Total Bilirubin: 0.5 mg/dL (ref 0.0–1.2)
Total Protein: 4.8 g/dL — ABNORMAL LOW (ref 6.5–8.1)

## 2024-02-03 LAB — COOXEMETRY PANEL
Carboxyhemoglobin: 1.6 % — ABNORMAL HIGH (ref 0.5–1.5)
Methemoglobin: <0.7 % (ref 0.0–1.5)
O2 Saturation: 53.2 %
Total hemoglobin: 8.9 g/dL — ABNORMAL LOW (ref 12.0–16.0)

## 2024-02-03 LAB — PHOSPHORUS: Phosphorus: 8.5 mg/dL — ABNORMAL HIGH (ref 2.5–4.6)

## 2024-02-03 LAB — MAGNESIUM: Magnesium: 2.9 mg/dL — ABNORMAL HIGH (ref 1.7–2.4)

## 2024-02-03 MED ORDER — INSULIN ASPART 100 UNIT/ML IJ SOLN
0.0000 [IU] | INTRAMUSCULAR | Status: DC
  Administered 2024-02-03 (×3): 3 [IU] via SUBCUTANEOUS
  Administered 2024-02-03: 5 [IU] via SUBCUTANEOUS
  Administered 2024-02-04 (×3): 3 [IU] via SUBCUTANEOUS
  Administered 2024-02-04: 2 [IU] via SUBCUTANEOUS
  Administered 2024-02-04 – 2024-02-05 (×2): 3 [IU] via SUBCUTANEOUS
  Administered 2024-02-05: 2 [IU] via SUBCUTANEOUS

## 2024-02-03 MED ORDER — TRAZODONE HCL 50 MG PO TABS
50.0000 mg | ORAL_TABLET | Freq: Every day | ORAL | Status: DC
  Administered 2024-02-03 – 2024-02-14 (×12): 50 mg via ORAL
  Filled 2024-02-03 (×12): qty 1

## 2024-02-03 MED ORDER — SUCRALFATE 1 GM/10ML PO SUSP
1.0000 g | Freq: Three times a day (TID) | ORAL | Status: DC
  Administered 2024-02-03 – 2024-02-04 (×4): 1 g via ORAL
  Filled 2024-02-03 (×5): qty 10

## 2024-02-03 MED ORDER — MELATONIN 5 MG PO TABS
5.0000 mg | ORAL_TABLET | Freq: Every day | ORAL | Status: DC
  Administered 2024-02-03 – 2024-02-24 (×21): 5 mg via ORAL
  Filled 2024-02-03 (×22): qty 1

## 2024-02-03 MED ORDER — TRACE MINERALS CU-MN-SE-ZN 300-55-60-3000 MCG/ML IV SOLN
INTRAVENOUS | Status: AC
  Filled 2024-02-03: qty 657.6

## 2024-02-03 MED ORDER — INSULIN GLARGINE 100 UNIT/ML ~~LOC~~ SOLN
5.0000 [IU] | Freq: Every day | SUBCUTANEOUS | Status: DC
  Administered 2024-02-03 – 2024-02-04 (×2): 5 [IU] via SUBCUTANEOUS
  Filled 2024-02-03 (×2): qty 0.05

## 2024-02-03 NOTE — Progress Notes (Signed)
 PHARMACY - TOTAL PARENTERAL NUTRITION CONSULT NOTE  Indication: Prolonged ileus  Patient Measurements: Height: 6' (182.9 cm) Weight: 102.5 kg (225 lb 15.5 oz) IBW/kg (Calculated) : 77.6 TPN AdjBW (KG): 91.1 Body mass index is 30.65 kg/m.  Assessment:  75 YOM presented on 8/30 with abdominal pain, found to have adynamic ileus and possible ischemic colitis.  Patient stated his last meal was on 8/30 and was likely losing weight PTA, but unable to quantify.  He was started on a clear liquid diet on 9/3 PM and hasn't been able to advance.  Pharmacy consulted to manage TPN.  He is at risk for refeeding syndrome.   9/9 AM update: He will receive lasix  40 mg IV x2 doses today [9/9]  Glucose / Insulin : no hx DM - CBGs variable (114-209) Used 10 units SSI charted in the past 24 hrs + empagliflozin  10 mg started 9/9 Electrolytes: Na 138, K 4.3 (goal >/= 4, has required a significant amount of KCL runs since admission, up to 20 runs on day one), Cl 102, Mag 1.8 (goal >/= 2), Phos 3.6 , Ca 7.8 [CoCa 9.4] Renal: SCr 0.97, BUN 26 Hepatic: AST 165 / ALT, Alk Phos, Tbili wnl, TG 101, albumin 2.0 Intake / Output; MIVF: UOP 0.48 mL/kg/hr, LBM 9/11 [2+ unmeasured 9/10 and another 9/11] GI Imaging: 9/5 KUB: diffuse gaseous distension of entire colon  GI Surgeries / Procedures: none since TPN initiation   Central access: PICC placed 01/24/24 TPN start date: 01/28/24  Nutritional Goals: Goal TPN rate is 80 mL/hr (provides 100g AA and 1965 kCal per day)  Concentrated TPN 9/11 to address fluid.  Gaol TPN rate 60 ml/hr (provides 98.6 g AA and 1922 Kcal per day)  RD Estimated Needs Total Energy Estimated Needs: 1900-2100 kcals Total Protein Estimated Needs: 95-110g Total Fluid Estimated Needs: 1.9-2.1L/day  Current Nutrition:  NPO and TPN   Plan:  Change TPN to 60 mL/hr at 1800 (goal rate 60 ml/hr) to provide 100% of estimated needs. Electrolytes in TPN: Na 34 mEq/L, K 67 mEq/L, Ca 8 mEq/L, Mg 15  mEq/L [maxed out for TPN], Phos 20 mmol/L , Cl:Ac 1:1  Continue standard MVI and trace elements to TPN Continue SSI sensitive Q4H  Mag 2 g iv x1 Monitor TPN labs on Mon/Thurs. Mag + RFP 9/12 AM labs F/up on advancing diet and discontinuing TPN when clinically appropriate    Prosper Paff BS, PharmD, BCPS Clinical Pharmacist 02/03/2024 7:25 AM  Contact: [phone] after 3 PM

## 2024-02-03 NOTE — Progress Notes (Addendum)
 Patient ID: Chase Combs, male   DOB: 1946-04-04, 78 y.o.   MRN: [id number]    Progress Note   Subjective   Day # 12 CC; recurrent GI bleeding in setting of very recent ST MI status post PCI/LAD 01/17/2024 with subsequent acute stent thrombosis 02/01/2024 with urgent PTCA to LAD, also with atrial fibrillation and severe heart failure   Required transfusion yesterday  IV cangrelor  Endoscopic evaluation yesterday with flexible sigmoidoscopy showing bright red blood in the colon from the rectum to the transverse colon underlying mucosa appeared normal few diverticuli noted in the left and sigmoid no active bleeding, single nonbleeding superficial ulcer in the rectum EGD-1 superficial esophageal ulcer with no bleeding, grade C esophagitis with no bleeding and a few erosions in the first portion of the duodenum.  Hemoglobin has been stable since last p.m.-hemoglobin 9.3 this a.m.>9.0. no active bleeding overnight or today, nurse reports he had a bowel movement earlier this afternoon that was greenish color no obvious blood Patient says he feels better today, says he had a lot of pain last night after the procedures which he attributes to gas, made it difficult for him to sleep but better today    Objective   Vital signs in last 24 hours: Temp:  [96.8 F (36 C)-97.7 F (36.5 C)] 97.6 F (36.4 C) (09/11 1131) Pulse Rate:  [84-210] 135 (09/11 1215) Resp:  [16-34] 32 (09/11 1215) BP: (69-163)/(42-150) 107/79 (09/11 1215) SpO2:  [76 %-100 %] 91 % (09/11 1200) Last BM Date : 02/03/24 General:    Elderly white male in NAD Heart: Tachycardic regular rate and rhythm; no murmurs Lungs: Respirations even and unlabored, lungs CTA bilaterally Abdomen:  Soft, obese, nontender and nondistended. Normal bowel sounds. Extremities:  Without edema. Neurologic:  Alert and oriented,  grossly normal neurologically. Psych:  Cooperative. Normal mood and affect.  Intake/Output from previous day: 09/10 0701 -  09/11 0700 In: 4395 [P.O.:240; I.V.:3197.4; Blood:657.3; IV Piggyback:300.2] Out: 1175 [Urine:1175] Intake/Output this shift: Total I/O In: 13 [I.V.:13] Out: -   Lab Results: Recent Labs    02/02/24 1837 02/03/24 0000 02/03/24 0816  WBC 12.6* 10.5 10.3  HGB 9.5* 9.3* 9.0*  HCT 28.9* 28.0* 28.5*  PLT 294 275 270   BMET Recent Labs    02/01/24 0830 02/02/24 0737 02/03/24 0816  NA 136 138 131*  K 3.8 4.3 3.8  CL 103 102 94*  CO2 24 25 22   GLUCOSE 178* 201* 409*  BUN 16 26* 32*  CREATININE 0.64 0.97 1.22  CALCIUM  8.1* 7.8* 7.5*   LFT Recent Labs    02/03/24 0816  PROT 4.8*  ALBUMIN 1.9*  AST 52*  ALT 24  ALKPHOS 58  BILITOT 0.5   PT/INR No results for input(s): LABPROT, INR in the last 72 hours.  Studies/Results: ECHOCARDIOGRAM COMPLETE Result Date: 02/02/2024    ECHOCARDIOGRAM REPORT   Patient Name:   Chase Combs Date of Exam: 02/02/2024 Medical Rec #:  [id number]      Height:       72.0 in Accession #:    [id number]     Weight:       226.0 lb Date of Birth:  [birth year]      BSA:          2.244 m Patient Age:    78 years       BP:           102/69 mmHg Patient Gender: M  HR:           102 bpm. Exam Location:  Inpatient Procedure: 2D Echo, Cardiac Doppler and Color Doppler (Both Spectral and Color            Flow Doppler were utilized during procedure). Indications:    Congestive Heart Failure I50.9  History:        Patient has prior history of Echocardiogram examinations, most                 recent 01/13/2024. CAD; Risk Factors:Hypertension and Sleep                 Apnea.  Sonographer:    Jayson Gaskins Referring Phys: [phone] AMY D CLEGG IMPRESSIONS  1. Left ventricular ejection fraction, by estimation, is 20 to 25%. The left ventricle has severely decreased function. The left ventricle demonstrates global hypokinesis. There is moderate concentric left ventricular hypertrophy. Indeterminate diastolic filling due to E-A fusion. There is abnormal  (paradoxical) septal motion, consistent with right ventricular volume overload.  2. Right ventricular systolic function is moderately reduced. The right ventricular size is mildly enlarged. Tricuspid regurgitation signal is inadequate for assessing PA pressure. The estimated right ventricular systolic pressure is 30.7 mmHg.  3. The mitral valve is degenerative. Moderate mitral valve regurgitation.  4. Tricuspid valve regurgitation is mild to moderate.  5. The aortic valve is tricuspid. Aortic valve regurgitation is trivial. Aortic valve sclerosis is present, with no evidence of aortic valve stenosis.  6. The inferior vena cava is dilated in size with <50% respiratory variability, suggesting right atrial pressure of 15 mmHg. Comparison(s): Changes from prior study are noted. 01/13/2024: LVEF 25-30%, moderate RV systolic dysfunction. FINDINGS  Left Ventricle: Left ventricular ejection fraction, by estimation, is 20 to 25%. The left ventricle has severely decreased function. The left ventricle demonstrates global hypokinesis. The left ventricular internal cavity size was normal in size. There is moderate concentric left ventricular hypertrophy. Abnormal (paradoxical) septal motion, consistent with right ventricular volume overload. Indeterminate diastolic filling due to E-A fusion. Right Ventricle: The right ventricular size is mildly enlarged. No increase in right ventricular wall thickness. Right ventricular systolic function is moderately reduced. Tricuspid regurgitation signal is inadequate for assessing PA pressure. The tricuspid regurgitant velocity is 1.98 m/s, and with an assumed right atrial pressure of 15 mmHg, the estimated right ventricular systolic pressure is 30.7 mmHg. Left Atrium: Left atrial size was normal in size. Right Atrium: Right atrial size was normal in size. Pericardium: There is no evidence of pericardial effusion. Mitral Valve: The mitral valve is degenerative in appearance. Moderate mitral  valve regurgitation. Tricuspid Valve: The tricuspid valve is normal in structure. Tricuspid valve regurgitation is mild to moderate. Aortic Valve: The aortic valve is tricuspid. Aortic valve regurgitation is trivial. Aortic valve sclerosis is present, with no evidence of aortic valve stenosis. Aortic valve mean gradient measures 5.0 mmHg. Aortic valve peak gradient measures 8.0 mmHg. Aortic valve area, by VTI measures 2.17 cm. Pulmonic Valve: The pulmonic valve was normal in structure. Pulmonic valve regurgitation is trivial. Aorta: The aortic root is normal in size and structure. Venous: The inferior vena cava is dilated in size with less than 50% respiratory variability, suggesting right atrial pressure of 15 mmHg. IAS/Shunts: No atrial level shunt detected by color flow Doppler.  LEFT VENTRICLE PLAX 2D LVIDd:         6.10 cm LVIDs:         5.70 cm LV PW:  1.20 cm LV IVS:        1.30 cm LVOT diam:     2.00 cm LV SV:         47 LV SV Index:   21 LVOT Area:     3.14 cm  RIGHT VENTRICLE RV S prime:     14.00 cm/s TAPSE (M-mode): 1.8 cm LEFT ATRIUM             Index        RIGHT ATRIUM           Index LA Vol (A2C):   93.1 ml 41.49 ml/m  RA Area:     15.90 cm LA Vol (A4C):   30.5 ml 13.59 ml/m  RA Volume:   40.70 ml  18.14 ml/m LA Biplane Vol: 56.5 ml 25.18 ml/m  AORTIC VALVE AV Area (Vmax):    2.12 cm AV Area (Vmean):   1.99 cm AV Area (VTI):     2.17 cm AV Vmax:           141.00 cm/s AV Vmean:          101.000 cm/s AV VTI:            0.216 m AV Peak Grad:      8.0 mmHg AV Mean Grad:      5.0 mmHg LVOT Vmax:         95.00 cm/s LVOT Vmean:        63.900 cm/s LVOT VTI:          0.149 m LVOT/AV VTI ratio: 0.69  AORTA Ao Root diam: 2.90 cm MITRAL VALVE               TRICUSPID VALVE MV Area (PHT): 2.93 cm    TR Peak grad:   15.7 mmHg MV Decel Time: 259 msec    TR Vmax:        198.00 cm/s MV E velocity: 87.50 cm/s                            SHUNTS                            Systemic VTI:  0.15 m                             Systemic Diam: 2.00 cm Vinie Maxcy MD Electronically signed by Vinie Maxcy MD Signature Date/Time: 02/02/2024/3:33:36 PM    Final        Assessment / Plan:     #65 78 year old white male with recurrent GI bleeding yesterday in the setting of cangrelor   EGD and flexible sigmoidoscopy with finding of red blood in the colon but no active bleeding, scattered diverticulosis.  Grade C esophagitis, 1 superficial esophageal ulcer with no bleeding and a few erosions in the duodenum  Bleeding source not found, likely that he was bleeding from right sided diverticuli, right sided colonic sided ulceration (given history of fairly recent right sided ischemic colitis, and CT on 01/30/2024 showing mild wall and fold thickening of the ascending colon, or small bowel source Fortunately bleeding has subsided and he has not had any further bleeding overnight or today  Hemoglobin stable in the 9 range  Remains on Cangrelor   #2 Status post very recent ST EMI status post drug-eluting stent PTCA LAD 01/17/2024.  Complicated by in-stent stenosis  requiring PTCA LAD 02/01/2024  #3 severe heart failure with EF 20 to 25% He is on milrinone , twice daily Lasix   #4 new A-fib with RVR-on amiodarone  #5 recent septic shock on admission with ischemic colitis-clinically resolved  Plan; heart healthy diet Continue serial hemoglobins every 8 hours and transfuse as needed for hemoglobin 7.5 or less Continue IV Protonix   If he has recurrent major hemorrhage would send back for CT angio  GI will continue to follow along       Principal Problem:   Intestinal ischemia (HCC) Active Problems:   Coronary artery disease involving native coronary artery of native heart without angina pectoris   ST elevation myocardial infarction involving left anterior descending (LAD) coronary artery (HCC)   Essential hypertension   Hyperlipidemia associated with type 2 diabetes mellitus (HCC)   Sepsis (HCC)    Pneumatosis intestinalis of large intestine   Malnutrition of moderate degree   Ogilvie syndrome   Small intestinal bacterial overgrowth (SIBO)   Gastrointestinal hemorrhage   Acute blood loss anemia   Persistent atrial fibrillation (HCC)   Systolic heart failure (HCC)   Rectal bleeding   Ileus (HCC)   Atrial fibrillation with RVR (HCC)   ABLA (acute blood loss anemia)   Pressure injury of skin     LOS: 12 days   Amy Esterwood PA-C 02/03/2024, 2:53 PM   Attending physician's note   I personally saw the patient and performed a substantive portion of the medical decision making process for this encounter (including a complete performance of the key components : MDM, Hx and Exam), in conjunction with the APP.  I agree with the APP's note, impression, and  the management plan for the number and complexity of problems addressed at the encounter for the patient and take responsibility for that plan with its inherent risk of complications, morbidity, or mortality with additional input as follows.      No further bleeding.  Hemoglobin has remained stable On exam abdomen soft with no distention or tenderness  78 year old male status post STEMI PCI to LAD 01/17/2024, stent thrombosis with urgent PTCA to LAD, A-fib, heart failure with recurrent large-volume hematochezia with hemodynamic instability, status post EGD and flex sig with findings of gastritis, esophageal ulcer, diverticulosis and rectal ulcer, no active bleeding.  Cannot rule out recurrent right-sided ischemic colitis, will hold off colonoscopy given he is no longer having hematochezia and hemoglobin is stable Recommend outpatient colonoscopy once acute cardiac issues resolve in 3 to 6 months Continue antiplatelet therapy Monitor hemoglobin and transfuse if below 7  Please call GI back if needed, if develops signs of recurrent bleed  Moderate complex decision making ( chart review, review of results, face-to-face time used for  counseling as well as treatment plan and follow-up. The patient was provided an opportunity to ask questions and all were answered. The patient agreed with the plan and demonstrated an understanding of the instructions.   LOIS Wilkie Mcgee , MD [phone]

## 2024-02-03 NOTE — Progress Notes (Signed)
 Advanced Heart Failure Rounding Note  Cardiologist: Alm Clay, MD  Chief Complaint: Abdominal Discomfort Subjective:   9/9 PTCA LAD. A fib RVR. Placed on Amio drip. CO-OX 44 %. Started on milrinone  0.25 mcg.   S/p 2uPRBCs yesterday. Hgb 9.   S/p EGD and flex sig yesterday.  EGD with 1 superficial esophageal ulcer, LA grade C reflux esophagitis with no bleeding. Flex sig: Red blood in the rectum, sigmoid colon, descending colon and transverse colon.few diverticula found. No obvious source of bleeding. Plan for repeat colonoscopy.   Feels better this morning. CVP 12.  Objective:   Weight Range: 102.5 kg Body mass index is 30.65 kg/m.   Vital Signs:   Temp:  [96.8 F (36 C)-97.9 F (36.6 C)] 96.8 F (36 C) (09/11 0700) Pulse Rate:  [81-210] 101 (09/11 0700) Resp:  [16-35] 34 (09/11 0700) BP: (60-163)/(42-150) 118/98 (09/11 0700) SpO2:  [76 %-100 %] 90 % (09/11 0700) Last BM Date : 02/02/24  Weight change: Filed Weights   01/31/24 0401 02/01/24 0500 02/02/24 0421  Weight: 96.8 kg 95.9 kg 102.5 kg    Intake/Output:   Intake/Output Summary (Last 24 hours) at 02/03/2024 0834 Last data filed at 02/03/2024 0700 Gross per 24 hour  Intake 4017.25 ml  Output 1175 ml  Net 2842.25 ml    CVP 12   Physical Exam    General:  elderly / weak appearing.  No respiratory difficulty Neck: JVD ~10 cm.  Cor: Regular rate & rhythm. No murmurs. Lungs: clear Extremities: +1 BLE edema  Neuro: alert & oriented x 3. Affect pleasant.   Telemetry  A fib RVR 120s (Personally reviewed)   EKG   N/A  Labs    CBC Recent Labs    02/01/24 0458 02/01/24 1706 02/02/24 1837 02/03/24 0000  WBC 9.8   < > 12.6* 10.5  NEUTROABS 8.1*  --   --   --   HGB 9.2*   < > 9.5* 9.3*  HCT 27.5*   < > 28.9* 28.0*  MCV 87.3   < > 89.2 89.7  PLT 291   < > 294 275   < > = values in this interval not displayed.   Basic Metabolic Panel Recent Labs    90/90/74 0458 02/01/24 0615  02/01/24 0615 02/01/24 0830 02/02/24 0737  NA  --  130*   < > 136 138  K  --  5.4*   < > 3.8 4.3  CL  --  97*   < > 103 102  CO2  --  24   < > 24 25  GLUCOSE  --  721*   < > 178* 201*  BUN  --  15   < > 16 26*  CREATININE  --  0.69   < > 0.64 0.97  CALCIUM   --  7.9*   < > 8.1* 7.8*  MG 2.3  --   --   --  1.8  PHOS  --  5.3*  --  3.6  --    < > = values in this interval not displayed.   Liver Function Tests Recent Labs    02/01/24 0615 02/02/24 0737  AST  --  165*  ALT  --  30  ALKPHOS  --  57  BILITOT  --  0.4  PROT  --  4.5*  ALBUMIN 1.9* 2.0*   No results for input(s): LIPASE, AMYLASE in the last 72 hours. Cardiac Enzymes No results for input(s): CKTOTAL, CKMB,  CKMBINDEX, TROPONINI in the last 72 hours.  BNP: BNP (last 3 results) Recent Labs    01/11/24 0539 01/13/24 0537 01/22/24 1243  BNP 842.8* 1,747.3* 220.4*   ProBNP (last 3 results) Recent Labs    01/07/24 2301  PROBNP 15,245.0*   D-Dimer No results for input(s): DDIMER in the last 72 hours. Hemoglobin A1C No results for input(s): HGBA1C in the last 72 hours. Fasting Lipid Panel No results for input(s): CHOL, HDL, LDLCALC, TRIG, CHOLHDL, LDLDIRECT in the last 72 hours.  Thyroid  Function Tests No results for input(s): TSH, T4TOTAL, T3FREE, THYROIDAB in the last 72 hours.  Invalid input(s): FREET3  Other results:  Imaging   ECHOCARDIOGRAM COMPLETE Result Date: 02/02/2024    ECHOCARDIOGRAM REPORT   Patient Name:   Chase Combs Date of Exam: 02/02/2024 Medical Rec #:  [id number]      Height:       72.0 in Accession #:    [id number]     Weight:       226.0 lb Date of Birth:  1945/12/17      BSA:          2.244 m Patient Age:    78 years       BP:           102/69 mmHg Patient Gender: M              HR:           102 bpm. Exam Location:  Inpatient Procedure: 2D Echo, Cardiac Doppler and Color Doppler (Both Spectral and Color            Flow Doppler were utilized  during procedure). Indications:    Congestive Heart Failure I50.9  History:        Patient has prior history of Echocardiogram examinations, most                 recent 01/13/2024. CAD; Risk Factors:Hypertension and Sleep                 Apnea.  Sonographer:    Jayson Gaskins Referring Phys: [phone] AMY D CLEGG IMPRESSIONS  1. Left ventricular ejection fraction, by estimation, is 20 to 25%. The left ventricle has severely decreased function. The left ventricle demonstrates global hypokinesis. There is moderate concentric left ventricular hypertrophy. Indeterminate diastolic filling due to E-A fusion. There is abnormal (paradoxical) septal motion, consistent with right ventricular volume overload.  2. Right ventricular systolic function is moderately reduced. The right ventricular size is mildly enlarged. Tricuspid regurgitation signal is inadequate for assessing PA pressure. The estimated right ventricular systolic pressure is 30.7 mmHg.  3. The mitral valve is degenerative. Moderate mitral valve regurgitation.  4. Tricuspid valve regurgitation is mild to moderate.  5. The aortic valve is tricuspid. Aortic valve regurgitation is trivial. Aortic valve sclerosis is present, with no evidence of aortic valve stenosis.  6. The inferior vena cava is dilated in size with <50% respiratory variability, suggesting right atrial pressure of 15 mmHg. Comparison(s): Changes from prior study are noted. 01/13/2024: LVEF 25-30%, moderate RV systolic dysfunction. FINDINGS  Left Ventricle: Left ventricular ejection fraction, by estimation, is 20 to 25%. The left ventricle has severely decreased function. The left ventricle demonstrates global hypokinesis. The left ventricular internal cavity size was normal in size. There is moderate concentric left ventricular hypertrophy. Abnormal (paradoxical) septal motion, consistent with right ventricular volume overload. Indeterminate diastolic filling due to E-A fusion. Right Ventricle: The right  ventricular size is  mildly enlarged. No increase in right ventricular wall thickness. Right ventricular systolic function is moderately reduced. Tricuspid regurgitation signal is inadequate for assessing PA pressure. The tricuspid regurgitant velocity is 1.98 m/s, and with an assumed right atrial pressure of 15 mmHg, the estimated right ventricular systolic pressure is 30.7 mmHg. Left Atrium: Left atrial size was normal in size. Right Atrium: Right atrial size was normal in size. Pericardium: There is no evidence of pericardial effusion. Mitral Valve: The mitral valve is degenerative in appearance. Moderate mitral valve regurgitation. Tricuspid Valve: The tricuspid valve is normal in structure. Tricuspid valve regurgitation is mild to moderate. Aortic Valve: The aortic valve is tricuspid. Aortic valve regurgitation is trivial. Aortic valve sclerosis is present, with no evidence of aortic valve stenosis. Aortic valve mean gradient measures 5.0 mmHg. Aortic valve peak gradient measures 8.0 mmHg. Aortic valve area, by VTI measures 2.17 cm. Pulmonic Valve: The pulmonic valve was normal in structure. Pulmonic valve regurgitation is trivial. Aorta: The aortic root is normal in size and structure. Venous: The inferior vena cava is dilated in size with less than 50% respiratory variability, suggesting right atrial pressure of 15 mmHg. IAS/Shunts: No atrial level shunt detected by color flow Doppler.  LEFT VENTRICLE PLAX 2D LVIDd:         6.10 cm LVIDs:         5.70 cm LV PW:         1.20 cm LV IVS:        1.30 cm LVOT diam:     2.00 cm LV SV:         47 LV SV Index:   21 LVOT Area:     3.14 cm  RIGHT VENTRICLE RV S prime:     14.00 cm/s TAPSE (M-mode): 1.8 cm LEFT ATRIUM             Index        RIGHT ATRIUM           Index LA Vol (A2C):   93.1 ml 41.49 ml/m  RA Area:     15.90 cm LA Vol (A4C):   30.5 ml 13.59 ml/m  RA Volume:   40.70 ml  18.14 ml/m LA Biplane Vol: 56.5 ml 25.18 ml/m  AORTIC VALVE AV Area (Vmax):     2.12 cm AV Area (Vmean):   1.99 cm AV Area (VTI):     2.17 cm AV Vmax:           141.00 cm/s AV Vmean:          101.000 cm/s AV VTI:            0.216 m AV Peak Grad:      8.0 mmHg AV Mean Grad:      5.0 mmHg LVOT Vmax:         95.00 cm/s LVOT Vmean:        63.900 cm/s LVOT VTI:          0.149 m LVOT/AV VTI ratio: 0.69  AORTA Ao Root diam: 2.90 cm MITRAL VALVE               TRICUSPID VALVE MV Area (PHT): 2.93 cm    TR Peak grad:   15.7 mmHg MV Decel Time: 259 msec    TR Vmax:        198.00 cm/s MV E velocity: 87.50 cm/s  SHUNTS                            Systemic VTI:  0.15 m                            Systemic Diam: 2.00 cm Vinie Maxcy MD Electronically signed by Vinie Maxcy MD Signature Date/Time: 02/02/2024/3:33:36 PM    Final     Medications:   Scheduled Medications:  Chlorhexidine  Gluconate Cloth  6 each Topical Q0600   furosemide   80 mg Intravenous BID   Gerhardt's butt cream   Topical BID   insulin  aspart  0-9 Units Subcutaneous Q4H   metroNIDAZOLE   500 mg Oral Q12H   nystatin    Topical BID   pantoprazole  (PROTONIX ) IV  40 mg Intravenous Q12H   sodium chloride  flush  10-40 mL Intracatheter Q12H   sodium chloride  flush  3 mL Intravenous Q12H   sucralfate   1 g Oral TID WC & HS    Infusions:  amiodarone  60 mg/hr (02/03/24 0700)   cangrelor  (KENGREAL ) 50 mg in sodium chloride  0.9 % 250 mL (0.2 mg/mL) infusion 0.75 mcg/kg/min (02/03/24 0719)   cefTRIAXone  (ROCEPHIN )  IV Stopped (02/02/24 1757)   famotidine  (PEPCID ) IV Stopped (02/02/24 2222)   milrinone  0.25 mcg/kg/min (02/03/24 0700)   ondansetron  (ZOFRAN ) IV     TPN ADULT (ION) 80 mL/hr at 02/03/24 0700    PRN Medications: acetaminophen , ALPRAZolam , HYDROmorphone  (DILAUDID ) injection, melatonin, ondansetron  (ZOFRAN ) IV, mouth rinse, polyethylene glycol, sodium chloride  flush, sodium chloride  flush  Patient Profile  Mr Jutte is a 78 year old with a history of ICM, and chronic biventricular HFrEF.       Readmitted 01/22/24 with abdominal pain/nausea/vomiting. Hospital complicated by ischemic colitis and septic shock/GI bleed.  Assessment/Plan  CAD, Instent Stenosis 01/17/24- PCI/Des LAD  9/9 Cangrelor  stopped given Plavix  load. Later developed acute chest pain. Returned urgently to cath lab. PTCA LAD. - Plavix  Genetic Testing collected.  - Continue high intensity statin - Continue Cangrelor  - Discussed anticoagulation plan with Pharmacy , CCM, Dr Zenaida.  - Off aspirin  with GI bleed.    New A fib RVR - Uncontrolled rate.  - Continue amio drip.  - Continue SCDs.  - Unable to restart eliquis. Plan for eliquis in few days if no further bleeding occurs.    A/C HFrEF, ICM   01/13/24 Echo EF  20-25% RV moderately reduced. Repeat ECHO   - CVP 17-18. Continue lasix  80 mg twice a day.  - CO-OX 53%. Lactic Acid WNL.  - Continue milrinone  0.25 mcg.  Plan to wean tomorrow - Off SGLT2i with fungal rash.  - Now off spiro - Follow renal function.    Anemia in the setting of GI Bleed -Possible bleeding from rectal tube. Off aspirin   -Transfused 9/8 with appropriate rise.  - 9 /10 he had large bloody BM. Unfortunately given 600 mg Ibuprofen .  - Hgb 9 today. S/p 2uPRBCs - Continue IV Protonix . Continue pepcid  and carafate .  - abd CT with no GIB. Possible infectious/inflammatory colitis - GI following.  - S/p EGD and flex sig yesterday.  EGD with 1 superficial esophageal ulcer, LA grade C reflux esophagitis with no bleeding. Flex sig: Red blood in the rectum, sigmoid colon, descending colon and transverse colon. Few diverticula found. No obvious source of bleeding. Plan for repeat colonoscopy if bleeding persists.    Septic Shock  On admit, ischemic  colitis. Treated with antibiotics. Resolved. GI/Gen Surgery signed off. Now back on board with GIB.    DNR     Length of Stay: 12  Beckey LITTIE Coe, NP  02/03/2024, 8:34 AM  Advanced Heart Failure Team Pager [phone] (M-F; 7a - 5p)  Please  contact [facility] for night-coverage after hours (5p -7a ) and weekends on [email or web address]

## 2024-02-03 NOTE — Progress Notes (Signed)
 NAME:  Chase Combs, MRN:  [id number], DOB:  05-Nov-1945, LOS: 12 ADMISSION DATE:  01/22/2024, CONSULTATION DATE:  01/22/24 REFERRING MD:  EDP, CHIEF COMPLAINT:  recurrent STEMI  History of Present Illness:  78 year old man w/ hx of ischemic cardiomyopathy and very recent LAD stent placement for NSTEMI discharged 8/27 (no d/c summary so unclear) with progressive nausea vomiting with CT images concerning for ischemic colitis.  Feeling weak and unwell since discharge 3 days ago.  Progressive nausea and vomiting not keeping down his antiplatelet medicines.  CT images reveal intestinal air in the wall of the intestine as well as concerning findings of ileus.  His lactate was elevated.  He was volume resuscitated.  Remained hypotensive.  Placed on broad-spectrum antibiotics he received vancomycin  Zosyn  doxycycline and Flagyl .  Labs notable for acute renal failure.  Anion gap metabolic acidosis.  Leukopenia, lymphopenia.  Had previously been sent out of ICU. Had ileus requiring TPN, now on clears. Had GIB requiring 2 units pRBC in the past few days, never able to be scoped due to need for need for ongoing AP therapy. This morning his cangrelor  was d/c and plavix  was started; several hours later he developed sudden onset shoulder pain, diaphoresis. EKG with anterior ST elevations. Taken for [facility] Transferring to the ICU post-procedure. Afib with RVR in LHC, no on amiodarone . LVEDP 28. Heparin  1200 units during cath.  Pertinent  Medical History  CAD, CHF  Significant Hospital Events: Including procedures, antibiotic start and stop dates in addition to other pertinent events   8/30 admitted with pneumatosis intestinalis/ileus.  General surgery recommend conservative management 9/8 1 unit pRBC 9/9 1 unit pRBC, switched off kangrelor to plavix > recurrent STEMI with in-stent stenosis. Started milrinone  for low coox, high CVP & LVEDP after. 9/11 No further bleeding overnight, Hgb stable, GI eval without clear  source of bleeding   Interim History / Subjective:  Main complaint is insomnia  No further evidence of GIB since yesterday Hgb stable, denies abdominal pain   Objective    Blood pressure (!) 118/98, pulse (!) 101, temperature (!) 96.8 F (36 C), temperature source Axillary, resp. rate (!) 34, height 6' (1.829 m), weight 102.5 kg, SpO2 90%. CVP:  [12 mmHg-38 mmHg] 29 mmHg      Intake/Output Summary (Last 24 hours) at 02/03/2024 0834 Last data filed at 02/03/2024 0700 Gross per 24 hour  Intake 4017.25 ml  Output 1175 ml  Net 2842.25 ml   Filed Weights   01/31/24 0401 02/01/24 0500 02/02/24 0421  Weight: 96.8 kg 95.9 kg 102.5 kg   General:  pale, ill appearing M resting in bed in NAD HEENT: MM pink/moist Neuro: alert and oriented  CV: s1s2 tachycardic, irregular, no m/r/g PULM:  clear bilaterally without rhonchi or wheezing no distress GI: soft, non-tender to palpation  Extremities: warm/dry, no edema     Coox 53% BUN 32 Cr 1.22 Glu 409 WBC 9.8 H/H 10.3/22.2 Platelets 251   Resolved problem list  Severe sepsis Lactic acidosis AKI due to septic ATN  Hypokalemia hyponatremia  Assessment and Plan   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; concern for plavix  failure, s/p LHC with balloon dilation 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 -plan per cardiology is to continue cangrelor  and possibly transition to Brilinta  9/12 or 9/15 (not over the weekend) -statin -holding aspirin  -tele monitoring -appreciate cardiology's management -serial coox, con't milrinone   Afib with RVR- new onset this admission; more rate controlled after trasnfusions today -con't amiodarone  -  correct/ address anemia -con't holding full dose AC due to active GIB  Intestinal pneumatosis concerning for ischemic vs infectious colitis: Severe arthrosclerotic disease as evidenced by severe CAD Severe ileus -con't TPN and PPI -con't cangrelor  -suspicion for acute colitis or other source of  infection is decreasing, d/c abx today  ABLA, GIB of unknown origin-- potentially due to rectal tube. With rise in BUN & 2g drop overnight in Hb, worry about upper source. EGD with nonbleeding esophageal and gastric ulcers. -don't replace rectal tube -EGD & flex sig done 9/10 with esophageal ulcer, superficial ulcer of the rectum without stigmata of recent bleeding  -if re-bleeds consider repeat colonoscopy and CTA  -PPI BID and cont H2 blocker  -hold DVT prophylaxis and full dose AC -unfortunately can't stop cangreolor -avoid NSAIDS -CBC q8h overnight; transfuse for Hb <7 or hemodynamically significant bleeding   Hyperglycemia  -metabolic panel maybe drawn from TPN line today, finger stick 200 -increase SSI from sensitive to resistant  -A1c 6.4%  CRITICAL CARE Performed by: Leita SAUNDERS Isaac Dubie   Total critical care time: 35 minutes  Critical care time was exclusive of separately billable procedures and treating other patients.  Critical care was necessary to treat or prevent imminent or life-threatening deterioration.  Critical care was time spent personally by me on the following activities: development of treatment plan with patient and/or surrogate as well as nursing, discussions with consultants, evaluation of patient's response to treatment, examination of patient, obtaining history from patient or surrogate, ordering and performing treatments and interventions, ordering and review of laboratory studies, ordering and review of radiographic studies, pulse oximetry and re-evaluation of patient's condition.   Leita SAUNDERS Kesa Birky, PA-C [facility] See Amion for pager If no response to pager , please call [phone] until 7pm After 7:00 pm call Elink  [phone]

## 2024-02-03 NOTE — Progress Notes (Signed)
 Patient Name: Chase Combs Date of Encounter: 02/03/2024 [facility] Cardiologist: Alm Clay, MD   Interval Summary  .    S/p  emergent PTCA to LAD after acute stent thrombosis 9/92025  Recurrent GI bleeding requiring PRBC transfusion and urgent EGD and colonoscopy 02/02/2024 Esophageal ulcer, esophagitis, rectal ulcer, but no active bleeding. Current bleeding, stable hemoglobin this morning. Mixed venous O2 52%  Vital Signs .    Vitals:   02/03/24 0600 02/03/24 0615 02/03/24 0630 02/03/24 0700  BP: 122/82 (!) 163/150 109/81 (!) 118/98  Pulse: (!) 107 (!) 113 (!) 101 (!) 101  Resp: (!) 21 (!) 29 (!) 21 (!) 34  Temp:    (!) 96.8 F (36 C)  TempSrc:    Axillary  SpO2: 99% 97% 94% 90%  Weight:      Height:        Intake/Output Summary (Last 24 hours) at 02/03/2024 0855 Last data filed at 02/03/2024 0700 Gross per 24 hour  Intake 4017.25 ml  Output 1175 ml  Net 2842.25 ml      02/02/2024    4:21 AM 02/01/2024    5:00 AM 01/31/2024    4:01 AM  Last 3 Weights  Weight (lbs) 225 lb 15.5 oz 211 lb 6.7 oz 213 lb 6.5 oz  Weight (kg) 102.5 kg 95.9 kg 96.8 kg      Telemetry/ECG    02/02/2024 - Personally Reviewed Afib with controlled ventricular rate this morning  EKG 02/02/2024: Atrial fibrillation with rapid ventricular response Left axis deviation Right bundle branch block Anteroseptal infarct , age undetermined T wave abnormality, consider lateral ischemia  Physical Exam .   Physical Exam Vitals and nursing note reviewed.  Constitutional:      General: He is not in acute distress. Neck:     Vascular: No JVD.  Cardiovascular:     Rate and Rhythm: Tachycardia present. Rhythm irregular.     Heart sounds: Normal heart sounds. No murmur heard. Pulmonary:     Effort: Pulmonary effort is normal.     Breath sounds: Normal breath sounds. No wheezing or rales.  Abdominal:     Tenderness: There is abdominal tenderness (Epigastric).  Musculoskeletal:      Right lower leg: No edema.     Left lower leg: No edema.      Assessment & Plan .     78 year old male with hypertension, hyperlipidemia, OSA, CAD (LAD PCI 01/17/2024), complicated by recurrent GI bleed and subsequent acute stent thrombosis 02/01/2024 requiring urgent PTCA to LAD, HFrEF, persistent A-fib  CAD: Multivessel moderate CAD + obstructive prox-mid LAD disease-->LAD PCI 01/17/2024 Acute stent thrombosis 02/01/2024, likely due to inadequate platelet inhibition in the setting of recurrent GI bleed. Continue IV cangrelor  today again.  If no recurrent GI bleeding and hemoglobin stable, could consider switching to Brilinta  on 02/04/2024.  HFrEF/cardiogenic shock: Secondary to STEMI as well as recurrent GI bleeding. Currently on IV milrinone  as per heart failure team recommendations. Hold oral heart failure medications for now. Appears stable this morning with increasing mixed venous O2 sats, MAP >65, A-fib rate in low 100s.  Recurrent GI bleed: Recurrent GI bleed, last on 02/02/2024. Appreciate GI input, underwent EGD and colonoscopy with multiple findings, but no active bleeding. If he has any recurrent bleeding with hematochezia, will consider repeat colonoscopy and CT angiogram. For now, continue to monitor H/H.  Persistent atrial fibrillation: Rate controlled.  Continue IV amiodarone .  Unable to anticoagulate given recurrent GI bleed at this time.  For questions or updates, please contact [facility] Please consult [email or web address] for contact info under        Signed, Newman JINNY Lawrence, MD

## 2024-02-04 ENCOUNTER — Inpatient Hospital Stay: Admitting: Physician Assistant

## 2024-02-04 ENCOUNTER — Other Ambulatory Visit: Payer: Self-pay | Admitting: Cardiology

## 2024-02-04 ENCOUNTER — Inpatient Hospital Stay (HOSPITAL_COMMUNITY)

## 2024-02-04 DIAGNOSIS — R570 Cardiogenic shock: Secondary | ICD-10-CM | POA: Insufficient documentation

## 2024-02-04 DIAGNOSIS — D62 Acute posthemorrhagic anemia: Secondary | ICD-10-CM | POA: Diagnosis not present

## 2024-02-04 DIAGNOSIS — I4891 Unspecified atrial fibrillation: Secondary | ICD-10-CM | POA: Diagnosis not present

## 2024-02-04 DIAGNOSIS — I5022 Chronic systolic (congestive) heart failure: Secondary | ICD-10-CM | POA: Diagnosis not present

## 2024-02-04 DIAGNOSIS — K6389 Other specified diseases of intestine: Secondary | ICD-10-CM | POA: Diagnosis not present

## 2024-02-04 DIAGNOSIS — K625 Hemorrhage of anus and rectum: Secondary | ICD-10-CM

## 2024-02-04 DIAGNOSIS — K567 Ileus, unspecified: Secondary | ICD-10-CM | POA: Diagnosis not present

## 2024-02-04 DIAGNOSIS — I2510 Atherosclerotic heart disease of native coronary artery without angina pectoris: Secondary | ICD-10-CM | POA: Diagnosis not present

## 2024-02-04 DIAGNOSIS — K559 Vascular disorder of intestine, unspecified: Secondary | ICD-10-CM | POA: Diagnosis not present

## 2024-02-04 DIAGNOSIS — I4819 Other persistent atrial fibrillation: Secondary | ICD-10-CM | POA: Diagnosis not present

## 2024-02-04 LAB — BASIC METABOLIC PANEL WITH GFR
Anion gap: 10 (ref 5–15)
BUN: 43 mg/dL — ABNORMAL HIGH (ref 8–23)
CO2: 25 mmol/L (ref 22–32)
Calcium: 8.3 mg/dL — ABNORMAL LOW (ref 8.9–10.3)
Chloride: 96 mmol/L — ABNORMAL LOW (ref 98–111)
Creatinine, Ser: 1.17 mg/dL (ref 0.61–1.24)
GFR, Estimated: >60 mL/min (ref >60)
Glucose, Bld: 104 mg/dL — ABNORMAL HIGH (ref 70–99)
Potassium: 3.7 mmol/L (ref 3.5–5.1)
Sodium: 131 mmol/L — ABNORMAL LOW (ref 135–145)

## 2024-02-04 LAB — CBC
HCT: 27.8 % — ABNORMAL LOW (ref 39.0–52.0)
HCT: 29 % — ABNORMAL LOW (ref 39.0–52.0)
Hemoglobin: 9.1 g/dL — ABNORMAL LOW (ref 13.0–17.0)
Hemoglobin: 9.5 g/dL — ABNORMAL LOW (ref 13.0–17.0)
MCH: 28.7 pg (ref 26.0–34.0)
MCH: 28.9 pg (ref 26.0–34.0)
MCHC: 32.7 g/dL (ref 30.0–36.0)
MCHC: 32.8 g/dL (ref 30.0–36.0)
MCV: 87.6 fL (ref 80.0–100.0)
MCV: 88.3 fL (ref 80.0–100.0)
Platelets: 335 K/uL (ref 150–400)
Platelets: 341 K/uL (ref 150–400)
RBC: 3.15 MIL/uL — ABNORMAL LOW (ref 4.22–5.81)
RBC: 3.31 MIL/uL — ABNORMAL LOW (ref 4.22–5.81)
RDW: 19.2 % — ABNORMAL HIGH (ref 11.5–15.5)
RDW: 19.2 % — ABNORMAL HIGH (ref 11.5–15.5)
WBC: 10.6 K/uL — ABNORMAL HIGH (ref 4.0–10.5)
WBC: 9.6 K/uL (ref 4.0–10.5)
nRBC: 1 % — ABNORMAL HIGH (ref 0.0–0.2)
nRBC: 1.4 % — ABNORMAL HIGH (ref 0.0–0.2)

## 2024-02-04 LAB — GLUCOSE, CAPILLARY
Glucose-Capillary: 196 mg/dL — ABNORMAL HIGH (ref 70–99)
Glucose-Capillary: 196 mg/dL — ABNORMAL HIGH (ref 70–99)
Glucose-Capillary: 184 mg/dL — ABNORMAL HIGH (ref 70–99)
Glucose-Capillary: 185 mg/dL — ABNORMAL HIGH (ref 70–99)
Glucose-Capillary: 139 mg/dL — ABNORMAL HIGH (ref 70–99)
Glucose-Capillary: 103 mg/dL — ABNORMAL HIGH (ref 70–99)

## 2024-02-04 LAB — COMPREHENSIVE METABOLIC PANEL WITH GFR
ALT: 25 U/L (ref 0–44)
AST: 35 U/L (ref 15–41)
Albumin: 2 g/dL — ABNORMAL LOW (ref 3.5–5.0)
Alkaline Phosphatase: 54 U/L (ref 38–126)
Anion gap: 12 (ref 5–15)
BUN: 41 mg/dL — ABNORMAL HIGH (ref 8–23)
CO2: 23 mmol/L (ref 22–32)
Calcium: 8 mg/dL — ABNORMAL LOW (ref 8.9–10.3)
Chloride: 97 mmol/L — ABNORMAL LOW (ref 98–111)
Creatinine, Ser: 1.2 mg/dL (ref 0.61–1.24)
GFR, Estimated: >60 mL/min (ref >60)
Glucose, Bld: 202 mg/dL — ABNORMAL HIGH (ref 70–99)
Potassium: 4.1 mmol/L (ref 3.5–5.1)
Sodium: 132 mmol/L — ABNORMAL LOW (ref 135–145)
Total Bilirubin: 0.2 mg/dL (ref 0.0–1.2)
Total Protein: 5 g/dL — ABNORMAL LOW (ref 6.5–8.1)

## 2024-02-04 LAB — COOXEMETRY PANEL
Carboxyhemoglobin: 1.6 % — ABNORMAL HIGH (ref 0.5–1.5)
Carboxyhemoglobin: 1.6 % — ABNORMAL HIGH (ref 0.5–1.5)
Carboxyhemoglobin: 1.6 % — ABNORMAL HIGH (ref 0.5–1.5)
Methemoglobin: <0.7 % (ref 0.0–1.5)
Methemoglobin: 1.2 % (ref 0.0–1.5)
Methemoglobin: <0.7 % (ref 0.0–1.5)
O2 Saturation: 49.7 %
O2 Saturation: 51.3 %
O2 Saturation: 59.5 %
Total hemoglobin: 8.6 g/dL — ABNORMAL LOW (ref 12.0–16.0)
Total hemoglobin: 8.1 g/dL — ABNORMAL LOW (ref 12.0–16.0)
Total hemoglobin: 9.7 g/dL — ABNORMAL LOW (ref 12.0–16.0)

## 2024-02-04 LAB — RENAL FUNCTION PANEL
Albumin: 2 g/dL — ABNORMAL LOW (ref 3.5–5.0)
Anion gap: 12 (ref 5–15)
BUN: 42 mg/dL — ABNORMAL HIGH (ref 8–23)
CO2: 24 mmol/L (ref 22–32)
Calcium: 8.1 mg/dL — ABNORMAL LOW (ref 8.9–10.3)
Chloride: 96 mmol/L — ABNORMAL LOW (ref 98–111)
Creatinine, Ser: 1.2 mg/dL (ref 0.61–1.24)
GFR, Estimated: >60 mL/min (ref >60)
Glucose, Bld: 181 mg/dL — ABNORMAL HIGH (ref 70–99)
Phosphorus: 5.8 mg/dL — ABNORMAL HIGH (ref 2.5–4.6)
Potassium: 4.1 mmol/L (ref 3.5–5.1)
Sodium: 132 mmol/L — ABNORMAL LOW (ref 135–145)

## 2024-02-04 LAB — MAGNESIUM: Magnesium: 2.1 mg/dL (ref 1.7–2.4)

## 2024-02-04 MED ORDER — INSULIN GLARGINE 100 UNIT/ML ~~LOC~~ SOLN
8.0000 [IU] | Freq: Every day | SUBCUTANEOUS | Status: DC
  Filled 2024-02-04: qty 0.08

## 2024-02-04 MED ORDER — SPIRONOLACTONE 25 MG PO TABS
25.0000 mg | ORAL_TABLET | Freq: Every day | ORAL | Status: DC
  Administered 2024-02-04 – 2024-02-12 (×9): 25 mg via ORAL
  Filled 2024-02-04 (×9): qty 1

## 2024-02-04 MED ORDER — MILRINONE LACTATE IN DEXTROSE 20-5 MG/100ML-% IV SOLN
0.2500 ug/kg/min | INTRAVENOUS | Status: DC
  Administered 2024-02-04 – 2024-02-05 (×4): 0.375 ug/kg/min via INTRAVENOUS
  Administered 2024-02-06: 0.25 ug/kg/min via INTRAVENOUS
  Administered 2024-02-06: 0.375 ug/kg/min via INTRAVENOUS
  Administered 2024-02-07 – 2024-02-09 (×5): 0.25 ug/kg/min via INTRAVENOUS
  Filled 2024-02-04 (×11): qty 100

## 2024-02-04 MED ORDER — FUROSEMIDE 10 MG/ML IJ SOLN
120.0000 mg | Freq: Two times a day (BID) | INTRAVENOUS | Status: DC
  Administered 2024-02-04 (×2): 120 mg via INTRAVENOUS
  Filled 2024-02-04: qty 12
  Filled 2024-02-04 (×2): qty 120

## 2024-02-04 MED ORDER — TRACE MINERALS CU-MN-SE-ZN 300-55-60-3000 MCG/ML IV SOLN
INTRAVENOUS | Status: AC
  Filled 2024-02-04: qty 656.87

## 2024-02-04 MED ORDER — METOLAZONE 5 MG PO TABS
5.0000 mg | ORAL_TABLET | Freq: Once | ORAL | Status: AC
Start: 2024-02-04 — End: 2024-02-04
  Administered 2024-02-04: 5 mg via ORAL
  Filled 2024-02-04: qty 1

## 2024-02-04 MED ORDER — INSULIN ASPART 100 UNIT/ML IJ SOLN
2.0000 [IU] | INTRAMUSCULAR | Status: DC
  Administered 2024-02-04 – 2024-02-05 (×5): 2 [IU] via SUBCUTANEOUS

## 2024-02-04 MED ORDER — DIGOXIN 125 MCG PO TABS
0.0625 mg | ORAL_TABLET | Freq: Every day | ORAL | Status: DC
  Administered 2024-02-04 – 2024-02-25 (×22): 0.0625 mg via ORAL
  Filled 2024-02-04 (×22): qty 1

## 2024-02-04 MED ORDER — TICAGRELOR 90 MG PO TABS
90.0000 mg | ORAL_TABLET | Freq: Two times a day (BID) | ORAL | Status: DC
  Administered 2024-02-04 – 2024-02-21 (×35): 90 mg via ORAL
  Filled 2024-02-04 (×35): qty 1

## 2024-02-04 MED ORDER — POTASSIUM CHLORIDE CRYS ER 20 MEQ PO TBCR
40.0000 meq | EXTENDED_RELEASE_TABLET | ORAL | Status: AC
Start: 2024-02-04 — End: 2024-02-04
  Administered 2024-02-04 (×2): 40 meq via ORAL
  Filled 2024-02-04 (×2): qty 2

## 2024-02-04 MED ORDER — TICAGRELOR 90 MG PO TABS
180.0000 mg | ORAL_TABLET | Freq: Once | ORAL | Status: AC
  Administered 2024-02-04: 180 mg via ORAL
  Filled 2024-02-04: qty 2

## 2024-02-04 MED ORDER — FUROSEMIDE 10 MG/ML IJ SOLN
120.0000 mg | Freq: Once | INTRAVENOUS | Status: DC
  Filled 2024-02-04: qty 12

## 2024-02-04 MED ORDER — FUROSEMIDE 10 MG/ML IJ SOLN
20.0000 mg/h | INTRAVENOUS | Status: DC
  Administered 2024-02-04 – 2024-02-08 (×9): 20 mg/h via INTRAVENOUS
  Filled 2024-02-04 (×12): qty 20

## 2024-02-04 MED ORDER — ENSURE PLUS HIGH PROTEIN PO LIQD
237.0000 mL | Freq: Three times a day (TID) | ORAL | Status: DC
  Administered 2024-02-05 – 2024-02-06 (×3): 237 mL via ORAL

## 2024-02-04 NOTE — Progress Notes (Addendum)
 NAME:  Chase Combs, MRN:  [id number], DOB:  [birth year], LOS: 13 ADMISSION DATE:  01/22/2024, CONSULTATION DATE:  01/22/24 REFERRING MD:  EDP, CHIEF COMPLAINT:  recurrent STEMI  History of Present Illness:  78 year old man w/ hx of ischemic cardiomyopathy and very recent LAD stent placement for NSTEMI discharged 8/27 (no d/c summary so unclear) with progressive nausea vomiting with CT images concerning for ischemic colitis.  Feeling weak and unwell since discharge 3 days ago.  Progressive nausea and vomiting not keeping down his antiplatelet medicines.  CT images reveal intestinal air in the wall of the intestine as well as concerning findings of ileus.  His lactate was elevated.  He was volume resuscitated.  Remained hypotensive.  Placed on broad-spectrum antibiotics he received vancomycin  Zosyn  doxycycline and Flagyl .  Labs notable for acute renal failure.  Anion gap metabolic acidosis.  Leukopenia, lymphopenia.  Had previously been sent out of ICU. Had ileus requiring TPN, now on clears. Had GIB requiring 2 units pRBC in the past few days, never able to be scoped due to need for need for ongoing AP therapy. This morning his cangrelor  was d/c and plavix  was started; several hours later he developed sudden onset shoulder pain, diaphoresis. EKG with anterior ST elevations. Taken for [facility]. Transferring to the ICU post-procedure. Afib with RVR in LHC, no on amiodarone . LVEDP 28. Heparin  1200 units during cath.  Pertinent  Medical History  CAD, CHF  Significant Hospital Events: Including procedures, antibiotic start and stop dates in addition to other pertinent events   8/30 admitted with pneumatosis intestinalis/ileus.  General surgery recommend conservative management 9/8 1 unit pRBC 9/9 1 unit pRBC, switched off kangrelor to plavix > recurrent STEMI with in-stent stenosis. Started milrinone  for low coox, high CVP & LVEDP after. 9/11 No further bleeding overnight, Hgb stable, GI eval without clear  source of bleeding  9/12 hemodynamics stable. Blood work stable Adjusting GDMT. Advancing diet to full liq.   Interim History / Subjective:  Stable wants to eat gets a little SOB w/ activity room air sats high 90s   Objective    Blood pressure 118/76, pulse 85, temperature (!) 97.1 F (36.2 C), temperature source Axillary, resp. rate 18, height 6' (1.829 m), weight 102.5 kg, SpO2 94%. CVP:  [9 mmHg-30 mmHg] 30 mmHg      Intake/Output Summary (Last 24 hours) at 02/04/2024 1113 Last data filed at 02/04/2024 1100 Gross per 24 hour  Intake 3343.17 ml  Output 800 ml  Net 2543.17 ml   Filed Weights   01/31/24 0401 02/01/24 0500 02/02/24 0421  Weight: 96.8 kg 95.9 kg 102.5 kg   General sitting up in chair no distress HENT NCAT no JVD  Pulm clear. Reports exertional SOB but his room air sats high 90s Card af  Abd distended tympanic percussion  + bowel sounds. 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 Felt more ischemic  Severe ileus + bowel sounds. Had BM.  Plan Abx stopped 9/11 Repeat 1 view abd Will adv to full liquid.  con't TPN and PPI con't cangrelor   Ileus  Plan Adv diet  Repeat 1  view  ABLA, GIB of unknown origin-- potentially due to rectal tube. With rise in BUN & 2g drop overnight in Hb, worry about upper source. EGD & flex sig 9/10 with nonbleeding esophageal and gastric ulcers. Superficial ulcer on rectum w/out stigmta of bleeding Hgb stable  Plan PPI BID and cont H2 blocker  don't replace rectal tube unfortunately can't stop cangreolor avoid NSAIDS Change cbc daily CT angio if sudden bleeding    Hyperglycemia  -metabolic panel maybe drawn from TPN line today, finger stick 200 Plan Ssi  Increase basal to 8 units  CRITICAL CARE NA My time 28 min

## 2024-02-04 NOTE — Progress Notes (Signed)
 Advanced Heart Failure Rounding Note  Cardiologist: Alm Clay, MD  Chief Complaint: Abdominal Discomfort Subjective:   9/9 PTCA LAD. A fib RVR. Placed on Amio drip. CO-OX 44 %. Started on milrinone  0.25 mcg.  9/10:  EGD and flex sig 9/10.  EGD with 1 superficial esophageal ulcer, LA grade C reflux esophagitis with no bleeding. Flex sig: Red blood in the rectum, sigmoid colon, descending colon and transverse colon. Few diverticula found. No obvious source of bleeding.   S/p 2uPRBCs 9/10. Hgb 9.5. BM yesterday, no melena.   Co-ox 50% on 0.25 milrinone .   CVP up to 22 this morning.    Feels ok, resting in bed. Denies CP/SOB.  Objective:   Weight Range: 102.5 kg Body mass index is 30.65 kg/m.   Vital Signs:   Temp:  [96.9 F (36.1 C)-98.4 F (36.9 C)] 97.1 F (36.2 C) (09/12 0700) Pulse Rate:  [84-179] 96 (09/12 0700) Resp:  [19-39] 19 (09/12 0700) BP: (81-135)/(60-106) 95/66 (09/12 0700) SpO2:  [72 %-98 %] 95 % (09/12 0700) Last BM Date : 02/03/24  Weight change: Filed Weights   01/31/24 0401 02/01/24 0500 02/02/24 0421  Weight: 96.8 kg 95.9 kg 102.5 kg    Intake/Output:   Intake/Output Summary (Last 24 hours) at 02/04/2024 0748 Last data filed at 02/04/2024 0700 Gross per 24 hour  Intake 2847.29 ml  Output 800 ml  Net 2047.29 ml    CVP 22  Physical Exam    General:  elderly appearing.  No respiratory difficulty Neck: JVD to ear.  Cor: Irregular rate & rhythm. Lungs: diminished Extremities: +2 BLE edema to thigh Neuro: alert & oriented x 3. Affect pleasant.   Telemetry  A fib low 100s (Personally reviewed)    Labs    CBC Recent Labs    02/03/24 2342 02/04/24 0504  WBC 10.6* 9.6  HGB 9.1* 9.5*  HCT 27.8* 29.0*  MCV 88.3 87.6  PLT 341 335   Basic Metabolic Panel Recent Labs    90/88/74 0655 02/03/24 0816 02/04/24 0504  NA  --  131* 132*  132*  K  --  3.8 4.1  4.1  CL  --  94* 97*  96*  CO2  --  22 23  24   GLUCOSE  --  409*  202*  181*  BUN  --  32* 41*  42*  CREATININE  --  1.22 1.20  1.20  CALCIUM   --  7.5* 8.0*  8.1*  MG 2.9*  --  2.1  PHOS 8.5*  --  5.8*   Liver Function Tests Recent Labs    02/03/24 0816 02/04/24 0504  AST 52* 35  ALT 24 25  ALKPHOS 58 54  BILITOT 0.5 0.2  PROT 4.8* 5.0*  ALBUMIN 1.9* 2.0*  2.0*   No results for input(s): LIPASE, AMYLASE in the last 72 hours. Cardiac Enzymes No results for input(s): CKTOTAL, CKMB, CKMBINDEX, TROPONINI in the last 72 hours.  BNP: BNP (last 3 results) Recent Labs    01/11/24 0539 01/13/24 0537 01/22/24 1243  BNP 842.8* 1,747.3* 220.4*   ProBNP (last 3 results) Recent Labs    01/07/24 2301  PROBNP 15,245.0*   D-Dimer No results for input(s): DDIMER in the last 72 hours. Hemoglobin A1C No results for input(s): HGBA1C in the last 72 hours. Fasting Lipid Panel No results for input(s): CHOL, HDL, LDLCALC, TRIG, CHOLHDL, LDLDIRECT in the last 72 hours.  Thyroid  Function Tests No results for input(s): TSH, T4TOTAL, T3FREE, THYROIDAB in  the last 72 hours.  Invalid input(s): FREET3  Other results:  Imaging   No results found.   Medications:   Scheduled Medications:  Chlorhexidine  Gluconate Cloth  6 each Topical Q0600   Gerhardt's butt cream   Topical BID   insulin  aspart  0-15 Units Subcutaneous Q4H   insulin  glargine  5 Units Subcutaneous Daily   melatonin  5 mg Oral QHS   nystatin    Topical BID   pantoprazole  (PROTONIX ) IV  40 mg Intravenous Q12H   sodium chloride  flush  10-40 mL Intracatheter Q12H   sodium chloride  flush  3 mL Intravenous Q12H   sucralfate   1 g Oral TID WC & HS   traZODone   50 mg Oral QHS    Infusions:  amiodarone  60 mg/hr (02/04/24 0700)   cangrelor  (KENGREAL ) 50 mg in sodium chloride  0.9 % 250 mL (0.2 mg/mL) infusion 0.75 mcg/kg/min (02/04/24 0700)   famotidine  (PEPCID ) IV Stopped (02/03/24 2315)   milrinone  0.25 mcg/kg/min (02/04/24 0700)   ondansetron   (ZOFRAN ) IV     TPN ADULT (ION) 60 mL/hr at 02/04/24 0700    PRN Medications: acetaminophen , ALPRAZolam , HYDROmorphone  (DILAUDID ) injection, ondansetron  (ZOFRAN ) IV, mouth rinse, polyethylene glycol, sodium chloride  flush, sodium chloride  flush  Patient Profile  Mr Chase Combs is a 78 year old with a history of ICM, and chronic biventricular HFrEF.      Readmitted 01/22/24 with abdominal pain/nausea/vomiting. Hospital complicated by ischemic colitis and septic shock/GI bleed.  Assessment/Plan  CAD, Instent Stenosis 01/17/24- PCI/Des LAD  9/9 Cangrelor  stopped given Plavix  load. Later developed acute chest pain. Returned urgently to cath lab. PTCA LAD. - Plavix  Genetic Testing collected, pending.  - Continue high intensity statin - Stop Cangrelor . Start Ticagrelor  90 mg BID.  - Off aspirin  with GI bleed.    New A fib RVR - Uncontrolled rate.  - Continue amio drip.  - Continue SCDs.  - Plan for eventual Eliquis - Start digoxin  0.0625   A/C HFrEF, ICM   01/13/24 Echo EF  20-25% RV moderately reduced. Repeat ECHO   - CVP 22. Start lasix  120 IV BID. Follow response.  - CO-OX 50%. Lactic Acid WNL.  - Continue milrinone  0.25 mcg.   - Off SGLT2i with fungal rash.  - Start spiro 25 mg daily - Start digoxin  0.0625 mcg daily - Follow renal function.    Anemia in the setting of GI Bleed -Possible bleeding from rectal tube. Off aspirin   -Transfused 9/8 with appropriate rise.  - 9 /10 he had large bloody BM. Unfortunately given 600 mg Ibuprofen .  - Hgb 9.5 today. S/p 2uPRBCs 9/10 - Continue IV Protonix . Continue pepcid  and carafate .  - abd CT with no GIB. Possible infectious/inflammatory colitis - GI following.  - S/p EGD and flex sig yesterday.  EGD with 1 superficial esophageal ulcer, LA grade C reflux esophagitis with no bleeding. Flex sig: Red blood in the rectum, sigmoid colon, descending colon and transverse colon. Few diverticula found. No obvious source of bleeding. Plan for repeat  colonoscopy if bleeding persists.  - Hgb stable. No further bleeding.    Septic Shock  On admit, ischemic colitis. Treated with antibiotics. Resolved. GI/Gen Surgery signed off. Now back on board with GIB.    DNR     Length of Stay: [address], NP  02/04/2024, 7:48 AM  Advanced Heart Failure Team Pager [phone] (M-F; 7a - 5p)  Please contact [facility] for night-coverage after hours (5p -7a ) and weekends on [email or web address]

## 2024-02-04 NOTE — TOC Progression Note (Signed)
 Transition of Care [facility]) - Progression Note    Patient Details  Name: Chase Combs MRN: [id number] Date of Birth: Mar 17, 1946  Transition of Care [facility]) CM/SW Contact  Justina Delcia Czar, RN Phone Number: [phone] 02/04/2024, 4:31 PM  Clinical Narrative:     Spoke to pt and wife at bedside. Patient states he hopes to return home with wife. Waiting PT/OT evaluation, IP rehab vs SNF rehab.  Chart reviewed for discharge readiness, patient not medically stable for d/c. Inpatient CM/CSW will continue to monitor pt's advancement through interdisciplinary progression rounds. Will continue to follow for dc needs.   Expected Discharge Plan: Home w Home Health Services Barriers to Discharge: Continued Medical Work up               Expected Discharge Plan and Services   Discharge Planning Services: CM Consult   Living arrangements for the past 2 months: Single Family Home                                       Social Drivers of Health (SDOH) Interventions SDOH Screenings   Food Insecurity: No Food Insecurity (01/27/2024)  Housing: Low Risk  (01/27/2024)  Transportation Needs: No Transportation Needs (01/27/2024)  Utilities: Not At Risk (01/27/2024)  Social Connections: Socially Integrated (01/27/2024)  Tobacco Use: Low Risk  (02/02/2024)    Readmission Risk Interventions    01/26/2024   12:46 PM 10/21/2023   11:36 AM  Readmission Risk Prevention Plan  Transportation Screening Complete Complete  PCP or Specialist Appt within 5-7 Days  Complete  Home Care Screening  Complete  Medication Review (RN CM)  Complete  Medication Review (RN Care Manager) Referral to Pharmacy   PCP or Specialist appointment within 3-5 days of discharge Complete   HRI or Home Care Consult Complete   SW Recovery Care/Counseling Consult Complete   Palliative Care Screening Not Applicable   Skilled Nursing Facility Not Applicable

## 2024-02-04 NOTE — Progress Notes (Signed)
 Daily Progress Note   Patient Name: Chase Combs       Date: 02/04/2024 DOB: Aug 26, 1945  Age: 78 y.o. MRN#: [id number] Attending Physician: Gretta Leita SQUIBB, DO Primary Care Physician: Wonda Worth SQUIBB, PA Admit Date: 01/22/2024  Reason for Consultation/Follow-up: Establishing goals of care  Subjective: Medical records reviewed including progress notes, labs and imaging. Noted events since last PMT discussion with patient and family on 9/7, including acute stent thrombosis 02/01/2024, recurrent GI bleeding requiring PRBC transfusion and urgent EGD and colonoscopy 02/02/2024. No active bleeding was identified, possibly was bleeding from right-sided diverticuli/colonic ulceration. Hb now stable. Cardiology attempting to wean from IV milrinone  today.  Patient assessed at the bedside. He denies any concerns or questions regarding his plan of his. He is rate-controlled. He looks forward to progressing his diet and confirms that he continues to desire full scope treatment. He is agreeable to a call to his wife for palliative support and to ensure she has PMT contact information.  Called patient's wife Chase Combs and provided with PMT contact information. She confirms that she feels the same as Chase Combs when I provided her with update on the above interaction.  Length of Stay: 13  Physical Exam Vitals and nursing note reviewed.  Constitutional:      General: He is not in acute distress.    Appearance: He is ill-appearing.  HENT:     Head: Normocephalic and atraumatic.  Cardiovascular:     Rate and Rhythm: Normal rate.  Pulmonary:     Effort: Pulmonary effort is normal.  Skin:    General: Skin is warm and dry.  Neurological:     Mental Status: He is alert and oriented to person, place, and time.              Vital Signs: BP 113/68 (BP Location: Right Arm)   Pulse (!) 134   Temp (!) 97.1 F (36.2 C) (Axillary)   Resp (!) 29   Ht 6' (1.829 m)   Wt 102.5 kg   SpO2 (!) 87%   BMI 30.65 kg/m  SpO2: SpO2: (!) 87 % O2 Device: O2 Device: Nasal Cannula O2 Flow Rate: O2 Flow Rate (L/min): 4 L/min   Palliative Care Assessment & Plan   Patient Profile: 78 y.o. male  with past medical history of CAD/recent non-STEMI requiring LAD stent placement and discharged from the hospital on 01/19/2024, ischemic cardiomyopathy, recent decrease in EF from 40% to 25% by recent echocardiogram on 01/13/2024 compared to 07/12/2023, new onset A-fib with RVR, hypertension, hypercholesterolemia, diabetes mellitus, obesity admitted on 01/22/2024 with nausea and vomiting.    CT images concerning for intestinal pneumatosis with ischemic versus infectious colitis.  Patient was in septic shock, admitted to ICU under PCCM service.  He was started on broad-spectrum antibiotics.  Cardiology and general surgery consulted.  Patient was started on TPN. GI seeing. 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   Recommendations/Plan: Continue DNR Goals remain clear for full scope of care PMT will continue to follow peripherally and support as needed  Care plan was discussed with patient and his wife   Izzabell Klasen, PA-C Palliative Medicine Team Team phone # [phone]  Thank you for allowing  the Palliative Medicine Team to assist in the care of this patient. Please utilize secure chat with additional questions, if there is no response within 30 minutes please call the above phone number.  Palliative Medicine Team providers are available by phone from 7am to 7pm daily and can be reached through the team cell phone.  Should this patient require assistance outside of these hours, please call the patient's attending physician.  Portions of this note are a verbal dictation therefore any spelling and/or  grammatical errors are due to the Dragon Medical One system interpretation.    Time Total: 35  Visit consisted of counseling and education dealing with the complex and emotionally intense issues of symptom management and palliative care in the setting of serious and potentially life-threatening illness. Greater than 50% of this time was spent counseling and coordinating care related to the above assessment and plan.  Personally spent 35 minutes in patient care including extensive chart review (labs, imaging, progress/consult notes, vital signs), medically appropraite exam, discussed with treatment team, education to patient, family, and staff, documenting clinical information, medication review and management, coordination of care, and available advanced directive documents.

## 2024-02-04 NOTE — Progress Notes (Signed)
 Patient Name: Chase Combs Date of Encounter: 02/04/2024 [facility] Cardiologist: Alm Clay, MD   Interval Summary  .    S/p  emergent PTCA to LAD after acute stent thrombosis [id number]/92025  Recurrent GI bleeding requiring PRBC transfusion and urgent EGD and colonoscopy 02/02/2024 Esophageal ulcer, esophagitis, rectal ulcer, but no active bleeding. Source possibly diverticular bleed.  No recurrent bleeding, stable hemoglobin this morning.  Vital Signs .    Vitals:   02/04/24 0500 02/04/24 0600 02/04/24 0700 02/04/24 0800  BP: 104/73 109/76 95/66 113/68  Pulse: 92 (!) 118 96 (!) 134  Resp: (!) 24 (!) 21 19 (!) 29  Temp:   (!) 97.1 F (36.2 C)   TempSrc:   Axillary   SpO2: 97% (!) 76% 95% (!) 87%  Weight:      Height:        Intake/Output Summary (Last 24 hours) at 02/04/2024 0857 Last data filed at 02/04/2024 0800 Gross per 24 hour  Intake 2968.15 ml  Output 800 ml  Net 2168.15 ml      02/02/2024    4:21 AM 02/01/2024    5:00 AM 01/31/2024    4:01 AM  Last 3 Weights  Weight (lbs) 225 lb 15.5 oz 211 lb 6.7 oz 213 lb 6.5 oz  Weight (kg) 102.5 kg 95.9 kg 96.8 kg      Telemetry/ECG    02/02/2024 - Personally Reviewed Afib with controlled ventricular rate this morning  EKG 02/02/2024: Atrial fibrillation with rapid ventricular response Left axis deviation Right bundle branch block Anteroseptal infarct , age undetermined T wave abnormality, consider lateral ischemia  Physical Exam .   Physical Exam Vitals and nursing note reviewed.  Constitutional:      General: He is not in acute distress. Neck:     Vascular: No JVD.  Cardiovascular:     Rate and Rhythm: Tachycardia present. Rhythm irregular.     Heart sounds: Normal heart sounds. No murmur heard. Pulmonary:     Effort: Pulmonary effort is normal.     Breath sounds: Normal breath sounds. No wheezing or rales.  Abdominal:     Tenderness: There is no abdominal tenderness.  Musculoskeletal:      Right lower leg: No edema.     Left lower leg: No edema.      Assessment & Plan .     78 year old male with hypertension, hyperlipidemia, OSA, CAD (LAD PCI 01/17/2024), complicated by recurrent GI bleed and subsequent acute stent thrombosis 02/01/2024 requiring urgent PTCA to LAD, HFrEF, persistent A-fib  CAD: Multivessel moderate CAD + obstructive prox-mid LAD disease-->LAD PCI 01/17/2024 Acute stent thrombosis 02/01/2024, likely due to inadequate platelet inhibition in the setting of recurrent GI bleed. Hemoglobin stable. If okay with GI, will transition from IV Congello to p.o. Brilinta  today.  HFrEF/cardiogenic shock: Secondary to STEMI as well as recurrent GI bleeding. Currently on IV milrinone  as per heart failure team recommendations. Hold oral heart failure medications for now. Hopefully, we can wean him off IV milrinone  today.  Recurrent GI bleed: Recurrent GI bleed, last on 02/02/2024. Appreciate GI input, underwent EGD and colonoscopy with multiple findings, but no active bleeding. If he has any recurrent bleeding with hematochezia, will consider repeat colonoscopy and CT angiogram. For now, continue to monitor H/H.  Persistent atrial fibrillation: Rate controlled.  Continue IV amiodarone .  Unable to anticoagulate given recurrent GI bleed at this time.  For questions or updates, please contact [facility] Please consult [email or web address] for contact  info under        Signed, Newman JINNY Lawrence, MD

## 2024-02-04 NOTE — Progress Notes (Signed)
 PHARMACY - TOTAL PARENTERAL NUTRITION CONSULT NOTE  Indication: Prolonged ileus  Patient Measurements: Height: 6' (182.9 cm) Weight: 102.5 kg (225 lb 15.5 oz) IBW/kg (Calculated) : 77.6 TPN AdjBW (KG): 91.1 Body mass index is 30.65 kg/m.  Assessment:  52 YOM presented on 8/30 with abdominal pain, found to have adynamic ileus and possible ischemic colitis.  Patient stated his last meal was on 8/30 and was likely losing weight PTA, but unable to quantify.  He was started on a clear liquid diet on 9/3 PM and hasn't been able to advance.  Pharmacy consulted to manage TPN.    Glucose / Insulin : no hx DM - CBGs variable (172-235) Used 20 units SSI in the past 24 hrs + Lantus  5 units started 9/11 Electrolytes: low Na/CL, K 4.1 (goal >/= 4, has required a significant amount of KCL runs upon admission), Phos elevated (none in TPN) Renal: SCr 1.20, BUN 40s Hepatic: LFTs normalized, tbili / TG WNL, albumin 2 Intake / Output; MIVF: UOP 0.33 mL/kg/hr (Lasix  increased to 120mg  IV BID on 9/11), LBM 9/11 GI Imaging: 9/5 KUB: diffuse gaseous distension of entire colon GI Surgeries / Procedures: none since TPN initiation   Central access: PICC placed 01/24/24 TPN start date: 01/28/24  Nutritional Goals: Goal TPN is 80 mL/hr (= 100g AA and 1965 kCal per day) Goal concentrated TPN is 61 ml/hr (= 99g AA and 1922 kCal per day)  RD Estimated Needs Total Energy Estimated Needs: 1900-2100 kcals Total Protein Estimated Needs: 95-110g Total Fluid Estimated Needs: 1.9-2.1L/day  Current Nutrition:  TPN CLD started 9/10  Plan:  Continue concentrated TPN at 61 mL/hr to provide 100% of estimated needs. Electrolytes in TPN: Na 40 mEq/L, K 68 mEq/L, reduce Ca to 18mEq/L given Mag needs, Mg 15 mEq/L (max in TPN), Phos 0 mmol/L since 9/11, max CL  Continue standard MVI and trace elements to TPN Continue moderate SSI Q4H and 20 units regular insulin  in TPN Lantus  5 units SQ daily per MD Monitor TPN labs on  Mon/Thurs. RFP 9/13 AM labs F/u on advancing diet and discontinuing TPN when clinically appropriate

## 2024-02-04 NOTE — Progress Notes (Signed)
 PHARMACY - TOTAL PARENTERAL NUTRITION CONSULT NOTE  Indication: Prolonged ileus  Patient Measurements: Height: 6' (182.9 cm) Weight: 102.5 kg (225 lb 15.5 oz) IBW/kg (Calculated) : 77.6 TPN AdjBW (KG): 91.1 Body mass index is 30.65 kg/m.  Assessment:  54 YOM presented on 8/30 with abdominal pain, found to have adynamic ileus and possible ischemic colitis.  Patient stated his last meal was on 8/30 and was likely losing weight PTA, but unable to quantify.  He was started on a clear liquid diet on 9/3 PM and hasn't been able to advance.  Pharmacy consulted to manage TPN.  He is at risk for refeeding syndrome.   9/12 AM update: He will receive lasix  120 mg IV q12h 9/12 >>  Glucose / Insulin : no hx DM - CBGs variable (172-235) Used 20 units SSI charted in the past 24 hrs + empagliflozin  10 mg started 9/9 + Lantus  5 units started 9/11 Electrolytes: Na 132, K 4.1 (goal >/= 4, has required a significant amount of KCL runs since admission, up to 20 runs on day one), Cl 97, Mag 2.1 (goal >/= 2), Phos 5.8 , Ca 8.0 [CoCa 9.6] Renal: SCr 1.20, BUN 41 Hepatic: AST 165 / ALT, Alk Phos, Tbili wnl, TG 101, albumin 2.0 Intake / Output; MIVF: UOP 0.33 mL/kg/hr, 1+ unmeasured, LBM 9/11 [2+ unmeasured 9/10 and another 9/11] GI Imaging: 9/5 KUB: diffuse gaseous distension of entire colon  GI Surgeries / Procedures: none since TPN initiation   Central access: PICC placed 01/24/24 TPN start date: 01/28/24  Nutritional Goals: Goal TPN rate is 80 mL/hr (provides 100g AA and 1965 kCal per day)  Concentrated TPN 9/11 to address fluid.  Gaol TPN rate 60 ml/hr (provides 98.6 g AA and 1922 Kcal per day)  RD Estimated Needs Total Energy Estimated Needs: 1900-2100 kcals Total Protein Estimated Needs: 95-110g Total Fluid Estimated Needs: 1.9-2.1L/day  Current Nutrition:  NPO and TPN   Plan:  Change TPN to 61 mL/hr at 1800 (goal rate 60 ml/hr) to provide 100% of estimated needs. Adjusted hourly rate to  accommodate electrolyte adjustments Electrolytes in TPN: Na 40 mEq/L, K 68 mEq/L, Ca 7.9 mEq/L, Mg 15 mEq/L [maxed out for TPN], Phos 0 mmol/L , Cl:Ac maximize Cl  Continue standard MVI and trace elements to TPN Add 20 units regular insulin  to TPN Continue SSI sensitive Q4H  Monitor TPN labs on Mon/Thurs. RFP 9/13 AM labs F/up on advancing diet and discontinuing TPN when clinically appropriate    Benedetta Heath BS, PharmD, BCPS Clinical Pharmacist 02/04/2024 6:54 AM  Contact: [phone] after 3 PM

## 2024-02-04 NOTE — Progress Notes (Addendum)
 Co-ox remains low. CVP still ~20. Will increase milrinone  to 0.375 mcg/kg/min as previously discussed with Dr. Zenaida to help with volume removal.   Beckey Coe, AGACNP-BC  Advanced Heart Failure Team 02/04/24

## 2024-02-05 DIAGNOSIS — I2510 Atherosclerotic heart disease of native coronary artery without angina pectoris: Secondary | ICD-10-CM | POA: Diagnosis not present

## 2024-02-05 DIAGNOSIS — I5022 Chronic systolic (congestive) heart failure: Secondary | ICD-10-CM | POA: Diagnosis not present

## 2024-02-05 DIAGNOSIS — K559 Vascular disorder of intestine, unspecified: Secondary | ICD-10-CM | POA: Diagnosis not present

## 2024-02-05 LAB — CBC
HCT: 25.9 % — ABNORMAL LOW (ref 39.0–52.0)
Hemoglobin: 8.5 g/dL — ABNORMAL LOW (ref 13.0–17.0)
MCH: 34.3 pg — ABNORMAL HIGH (ref 26.0–34.0)
MCHC: 32.8 g/dL (ref 30.0–36.0)
MCV: 104.4 fL — ABNORMAL HIGH (ref 80.0–100.0)
Platelets: 345 K/uL (ref 150–400)
RBC: 2.48 MIL/uL — ABNORMAL LOW (ref 4.22–5.81)
RDW: 20.4 % — ABNORMAL HIGH (ref 11.5–15.5)
WBC: 6.5 K/uL (ref 4.0–10.5)
nRBC: 1.2 % — ABNORMAL HIGH (ref 0.0–0.2)

## 2024-02-05 LAB — BASIC METABOLIC PANEL WITH GFR
Anion gap: 14 (ref 5–15)
BUN: 46 mg/dL — ABNORMAL HIGH (ref 8–23)
CO2: 24 mmol/L (ref 22–32)
Calcium: 8.7 mg/dL — ABNORMAL LOW (ref 8.9–10.3)
Chloride: 93 mmol/L — ABNORMAL LOW (ref 98–111)
Creatinine, Ser: 1.2 mg/dL (ref 0.61–1.24)
GFR, Estimated: >60 mL/min (ref >60)
Glucose, Bld: 123 mg/dL — ABNORMAL HIGH (ref 70–99)
Potassium: 3.8 mmol/L (ref 3.5–5.1)
Sodium: 131 mmol/L — ABNORMAL LOW (ref 135–145)

## 2024-02-05 LAB — GLUCOSE, CAPILLARY
Glucose-Capillary: 98 mg/dL (ref 70–99)
Glucose-Capillary: 121 mg/dL — ABNORMAL HIGH (ref 70–99)
Glucose-Capillary: 152 mg/dL — ABNORMAL HIGH (ref 70–99)
Glucose-Capillary: 154 mg/dL — ABNORMAL HIGH (ref 70–99)
Glucose-Capillary: 141 mg/dL — ABNORMAL HIGH (ref 70–99)
Glucose-Capillary: 119 mg/dL — ABNORMAL HIGH (ref 70–99)

## 2024-02-05 LAB — COMPREHENSIVE METABOLIC PANEL WITH GFR
ALT: 26 U/L (ref 0–44)
AST: 29 U/L (ref 15–41)
Albumin: 2.1 g/dL — ABNORMAL LOW (ref 3.5–5.0)
Alkaline Phosphatase: 56 U/L (ref 38–126)
Anion gap: 11 (ref 5–15)
BUN: 43 mg/dL — ABNORMAL HIGH (ref 8–23)
CO2: 26 mmol/L (ref 22–32)
Calcium: 8.2 mg/dL — ABNORMAL LOW (ref 8.9–10.3)
Chloride: 92 mmol/L — ABNORMAL LOW (ref 98–111)
Creatinine, Ser: 1.2 mg/dL (ref 0.61–1.24)
GFR, Estimated: >60 mL/min (ref >60)
Glucose, Bld: 322 mg/dL — ABNORMAL HIGH (ref 70–99)
Potassium: 3.7 mmol/L (ref 3.5–5.1)
Sodium: 129 mmol/L — ABNORMAL LOW (ref 135–145)
Total Bilirubin: 0.4 mg/dL (ref 0.0–1.2)
Total Protein: 5.3 g/dL — ABNORMAL LOW (ref 6.5–8.1)

## 2024-02-05 LAB — COOXEMETRY PANEL
Carboxyhemoglobin: 1.3 % (ref 0.5–1.5)
Methemoglobin: <0.7 % (ref 0.0–1.5)
O2 Saturation: 66.5 %
Total hemoglobin: 7.3 g/dL — ABNORMAL LOW (ref 12.0–16.0)

## 2024-02-05 LAB — PHOSPHORUS
Phosphorus: 3.8 mg/dL (ref 2.5–4.6)
Phosphorus: 4 mg/dL (ref 2.5–4.6)

## 2024-02-05 LAB — MAGNESIUM: Magnesium: 2 mg/dL (ref 1.7–2.4)

## 2024-02-05 MED ORDER — TRACE MINERALS CU-MN-SE-ZN 300-55-60-3000 MCG/ML IV SOLN
INTRAVENOUS | Status: AC
  Filled 2024-02-05: qty 656.87

## 2024-02-05 MED ORDER — INSULIN ASPART 100 UNIT/ML IJ SOLN
0.0000 [IU] | INTRAMUSCULAR | Status: DC
  Administered 2024-02-05: 4 [IU] via SUBCUTANEOUS
  Administered 2024-02-05: 3 [IU] via SUBCUTANEOUS
  Administered 2024-02-06 (×2): 4 [IU] via SUBCUTANEOUS
  Administered 2024-02-06: 3 [IU] via SUBCUTANEOUS
  Administered 2024-02-06 (×3): 4 [IU] via SUBCUTANEOUS
  Administered 2024-02-07: 7 [IU] via SUBCUTANEOUS
  Administered 2024-02-07 (×2): 3 [IU] via SUBCUTANEOUS
  Administered 2024-02-07: 4 [IU] via SUBCUTANEOUS
  Administered 2024-02-07: 3 [IU] via SUBCUTANEOUS
  Administered 2024-02-08: 7 [IU] via SUBCUTANEOUS
  Administered 2024-02-08 – 2024-02-09 (×2): 4 [IU] via SUBCUTANEOUS
  Administered 2024-02-09: 7 [IU] via SUBCUTANEOUS

## 2024-02-05 MED ORDER — POTASSIUM CHLORIDE CRYS ER 20 MEQ PO TBCR
40.0000 meq | EXTENDED_RELEASE_TABLET | Freq: Four times a day (QID) | ORAL | Status: AC
  Administered 2024-02-05 (×2): 40 meq via ORAL
  Filled 2024-02-05 (×2): qty 2

## 2024-02-05 NOTE — Progress Notes (Signed)
 NAME:  Chase Combs, MRN:  [id number], DOB:  [birth year], LOS: 14 ADMISSION DATE:  01/22/2024, CONSULTATION DATE:  01/22/24 REFERRING MD:  EDP, CHIEF COMPLAINT:  recurrent STEMI  History of Present Illness:  78 year old man w/ hx of ischemic cardiomyopathy and very recent LAD stent placement for NSTEMI discharged 8/27 (no d/c summary so unclear) with progressive nausea vomiting with CT images concerning for ischemic colitis.  Feeling weak and unwell since discharge 3 days ago.  Progressive nausea and vomiting not keeping down his antiplatelet medicines.  CT images reveal intestinal air in the wall of the intestine as well as concerning findings of ileus.  His lactate was elevated.  He was volume resuscitated.  Remained hypotensive.  Placed on broad-spectrum antibiotics he received vancomycin  Zosyn  doxycycline and Flagyl .  Labs notable for acute renal failure.  Anion gap metabolic acidosis.  Leukopenia, lymphopenia.  Had previously been sent out of ICU. Had ileus requiring TPN, now on clears. Had GIB requiring 2 units pRBC in the past few days, never able to be scoped due to need for need for ongoing AP therapy. This morning his cangrelor  was d/c and plavix  was started; several hours later he developed sudden onset shoulder pain, diaphoresis. EKG with anterior ST elevations. Taken for [facility]. Transferring to the ICU post-procedure. Afib with RVR in LHC, no on amiodarone . LVEDP 28. Heparin  1200 units during cath.  Pertinent  Medical History  CAD, CHF  Significant Hospital Events: Including procedures, antibiotic start and stop dates in addition to other pertinent events   8/30 admitted with pneumatosis intestinalis/ileus.  General surgery recommend conservative management 9/8 1 unit pRBC 9/9 1 unit pRBC, switched off kangrelor to plavix > recurrent STEMI with in-stent stenosis. Started milrinone  for low coox, high CVP & LVEDP after. 9/11 No further bleeding overnight, Hgb stable, GI eval without clear  source of bleeding  9/12 hemodynamics stable. Blood work stable Adjusting GDMT. Advancing diet to full liq.   Interim History / Subjective:  Improved UOP with lasix  gtt. Has been drinking most of his liquids, no nausea. Had nonbloody BM.  Objective    Blood pressure (!) 83/72, pulse (!) 107, temperature 97.6 F (36.4 C), temperature source Oral, resp. rate (!) 23, height 6' (1.829 m), weight 98.4 kg, SpO2 99%. CVP:  [14 mmHg-32 mmHg] 14 mmHg      Intake/Output Summary (Last 24 hours) at 02/05/2024 0949 Last data filed at 02/05/2024 0800 Gross per 24 hour  Intake 2592.17 ml  Output 6050 ml  Net -3457.83 ml  Net for admission +14L  Filed Weights   02/01/24 0500 02/02/24 0421 02/05/24 0600  Weight: 95.9 kg 102.5 kg 98.4 kg   General chronically ill appearing man sitting up in the chair in NAD HENT /AT, eyes anicteric Pulm faint bibasilar rhales, breathing comfortably on RA Cardiac: S1S2, irreg rhythm, reg rate Abd soft, NT, hypoactive bowel sounds Gu  light yellow urine Neuro awake, alert, answering questions appropriately  Ext improving LE edema, no cyanosis  Na+ 129 K+ 3.7 BUN 43 Cr 1.2 WBC 6.5 H/H 8.5/25.9 Platelets 345 KUB: still has gaseous distention of colon   Resolved problem list  Severe sepsis Lactic acidosis AKI due to septic ATN  Hypokalemia hyponatremia  Assessment and Plan   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; concern for plavix  failure, s/p LHC with balloon dilation 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 with cardiogenic shock -Brilinta  BID; assuming previous plavix  failure -con't statin -holding aspirin  with GIB -serial coox, con't  milrinone  per AHF -spironolactone , digoxin  -GDMT limited due to cardiogenic shock & borderline Bps -con't aggressive diuresis with lasix  gtt  Afib with RVR- new onset this admission; more rate controlled after transfusions previously -amiodarone  -holding AC due to bleeding; hopefully can resume in a week  or two after ulcers have healed  Intestinal pneumatosis concerning for ischemic vs infectious colitis: Severe arthrosclerotic disease as evidenced by severe CAD. Felt more ischemic than infectious; potentially post-MI related cardiomyopathy. Severe ileus -advancing diet- trial of mechanical soft diet -maintain adequate perfusion -con't full TPN -kcal count as diet advances; has had low enteral protein intake this week  ABLA, GIB - most likely due to esophageal and gastric ulcers on EGD, although nonbleeding by the time of the scope. Stool nonbloody now.  -transfuse for Hb <7 or hemodynamically significant bleeding -avoid NSAIDs -hold AC; likely can start safely in 1-2 weeks -PPI & H2 blocker -needs OP c-scope in about 6 months  Hyperglycemia  -insulin  in TPN; stopping [place] long-acting insulin  -SSI PRN -goal BG 140-180  Hyponatremia, hypervolemic> suspect labs were diluted with TPN -diuresis -monitor  Deconditioning -PT, OT -OOB mobility stressed; he is reluctant   [name], DO 02/05/24 10:04 AM [facility]  For contact information, see Amion. If no response to pager, please call PCCM consult pager. After hours, 7PM- 7AM, please call Elink.

## 2024-02-05 NOTE — Plan of Care (Signed)
  Problem: Clinical Measurements: Goal: Respiratory complications will improve Outcome: Progressing   Problem: Clinical Measurements: Goal: Cardiovascular complication will be avoided Outcome: Progressing   Problem: Clinical Measurements: Goal: Diagnostic test results will improve Outcome: Progressing   Problem: Activity: Goal: Risk for activity intolerance will decrease Outcome: Progressing

## 2024-02-05 NOTE — Evaluation (Signed)
 Physical Therapy Evaluation Patient Details Name: Chase Combs MRN: [id number] DOB: 26-Mar-1946 Today's Date: 02/05/2024  History of Present Illness  78 year old man admitted 8/30 with progressive nausea vomiting with CT images concerning for ischemic colitis; pneumatosis intestinalis/ileus. 9/9  recurrent STEMI with in-stent stenosis.[name]  PMH: ischemic cardiomyopathy and very recent LAD stent placement for NSTEMI discharged 8/27.  Clinical Impression  Pt admitted with above diagnosis. Independent with a rollator PTA. Lives with wife but has several medical issues so won't be able to provide much physical assist at home. Pt states he has not been able to ambulate in about 5 days. Today he required mod assist for boost to stand from recliner, min assist from [facility] and to pivot safely with RW for support (posterior loss of balance on several occasions.) Min assist to ambulate up to 38 feet with +2 for chair follow. HR increased to upper 130s before he fatigued and needed to sit. Hopeful he will start to progress more quickly. Encouraged OOB and to ambulate with staff as tolerated several times per day. Patient will benefit from intensive inpatient follow-up therapy, >3 hours/day.  Pt currently with functional limitations due to the deficits listed below (see PT Problem List). Pt will benefit from acute skilled PT to increase their independence and safety with mobility to allow discharge.           If plan is discharge home, recommend the following: A lot of help with walking and/or transfers;A lot of help with bathing/dressing/bathroom;Assistance with cooking/housework;Assist for transportation   Can travel by private vehicle        Equipment Recommendations None recommended by PT  Recommendations for Other Services  Rehab consult    Functional Status Assessment Patient has had a recent decline in their functional status and demonstrates the ability to make significant improvements in function in a  reasonable and predictable amount of time.     Precautions / Restrictions Precautions Precautions: Fall Recall of Precautions/Restrictions: Intact Restrictions Weight Bearing Restrictions Per Provider Order: No      Mobility  Bed Mobility               General bed mobility comments: in recliner    Transfers Overall transfer level: Needs assistance Equipment used: Rolling walker (2 wheels) Transfers: Sit to/from Stand, Bed to chair/wheelchair/BSC Sit to Stand: Mod assist   Step pivot transfers: Min assist       General transfer comment: Mod assist for boost and balance to stand from recliner with posterior instability. Min assist from [facility] but still looses balance backwards needing assist to correct. RW to steady, cues for anterior weight shift and placement of RW for support. Min assist for RW control to step pivot.    Ambulation/Gait Ambulation/Gait assistance: Min assist, +2 safety/equipment Gait Distance (Feet): 38 Feet Assistive device: Rolling walker (2 wheels) Gait Pattern/deviations: Step-through pattern, Decreased stride length, Leaning posteriorly, Trunk flexed Gait velocity: dec Gait velocity interpretation: <1.31 ft/sec, indicative of household ambulator   General Gait Details: Cues for anterior weight shift and RW placement to maximize support. Min assist for balance and RW control initially but this improved as his distance progressed. +2 for chair follow. Fatigued at 38 feet. HR to upper 130s with exertion.  Stairs            Wheelchair Mobility     Tilt Bed    Modified Rankin (Stroke Patients Only)       Balance Overall balance assessment: Needs assistance Sitting-balance support:  No upper extremity supported, Feet supported Sitting balance-Leahy Scale: Fair     Standing balance support: Bilateral upper extremity supported, During functional activity, Reliant on assistive device for balance Standing balance-Leahy Scale: Poor                                Pertinent Vitals/Pain Pain Assessment Pain Assessment: Faces Faces Pain Scale: Hurts a little bit Pain Location: buttocks Pain Descriptors / Indicators: Burning Pain Intervention(s): Limited activity within patient's tolerance, Monitored during session, Repositioned    Home Living Family/patient expects to be discharged to:: Private residence Living Arrangements: Spouse/significant other Available Help at Discharge: Family;Available 24 hours/day Type of Home: House Home Access: Ramped entrance       Home Layout: One level Home Equipment: Agricultural consultant (2 wheels);Rollator (4 wheels);Cane - single point;Lift chair;Grab bars - toilet;Tub bench;Wheelchair - Pharmacist, hospital Comments: States wife with PD and recent TKA but able to care for herself. Hx of equipment a bit off during my evaluation compared to recent admission recordings. Would double check.    Prior Function Prior Level of Function : Driving;Independent/Modified Independent             Mobility Comments: Rollator use ADLs Comments: reports mod I PTA     Extremity/Trunk Assessment   Upper Extremity Assessment Upper Extremity Assessment: Defer to OT evaluation    Lower Extremity Assessment Lower Extremity Assessment: Generalized weakness       Communication   Communication Communication: No apparent difficulties    Cognition Arousal: Alert Behavior During Therapy: WFL for tasks assessed/performed   PT - Cognitive impairments: No family/caregiver present to determine baseline, Awareness, Problem solving                         Following commands: Intact       Cueing Cueing Techniques: Verbal cues     General Comments General comments (skin integrity, edema, etc.): Loose stool in BSC, assisted with peri-care. BP 102/67. HR 109 at rest, SpO2 100% on 3L.    Exercises General Exercises - Lower Extremity Ankle Circles/Pumps: AROM,  Both, 10 reps, Seated   Assessment/Plan    PT Assessment Patient needs continued PT services  PT Problem List Decreased strength;Decreased activity tolerance;Decreased balance;Decreased mobility;Decreased knowledge of use of DME;Decreased cognition;Decreased safety awareness;Decreased knowledge of precautions;Cardiopulmonary status limiting activity;Obesity       PT Treatment Interventions DME instruction;Gait training;Stair training;Functional mobility training;Therapeutic activities;Therapeutic exercise;Balance training;Neuromuscular re-education;Patient/family education;Cognitive remediation    PT Goals (Current goals can be found in the Care Plan section)  Acute Rehab PT Goals Patient Stated Goal: get well, return home PT Goal Formulation: With patient Time For Goal Achievement: 02/19/24 Potential to Achieve Goals: Good    Frequency Min 3X/week     Co-evaluation               AM-PAC PT 6 Clicks Mobility  Outcome Measure Help needed turning from your back to your side while in a flat bed without using bedrails?: A Little Help needed moving from lying on your back to sitting on the side of a flat bed without using bedrails?: A Little Help needed moving to and from a bed to a chair (including a wheelchair)?: A Lot Help needed standing up from a chair using your arms (e.g., wheelchair or bedside chair)?: A Little Help needed to walk in hospital room?: A Lot Help needed climbing 3-5  steps with a railing? : Total 6 Click Score: 14    End of Session Equipment Utilized During Treatment: Gait belt;Oxygen Activity Tolerance: Patient tolerated treatment well;Patient limited by fatigue Patient left: in chair;with call bell/phone within reach;with chair alarm set;with SCD's reapplied Nurse Communication: Mobility status PT Visit Diagnosis: Unsteadiness on feet (R26.81);Other abnormalities of gait and mobility (R26.89);Muscle weakness (generalized) (M62.81);Difficulty in walking,  not elsewhere classified (R26.2)    Time: 8795-8767 PT Time Calculation (min) (ACUTE ONLY): 28 min   Charges:   PT Evaluation $PT Eval Moderate Complexity: 1 Mod PT Treatments $Therapeutic Activity: 8-22 mins PT General Charges $$ ACUTE PT VISIT: 1 Visit         Leontine Roads, PT, DPT [facility]  Rehabilitation Services Physical Therapist Office: [phone] Website: [email or web address]   Leontine GORMAN Roads 02/05/2024, 2:32 PM

## 2024-02-05 NOTE — Progress Notes (Signed)
 Nutrition Brief Note    RD consulted for 48 hour calorie count. Please hang an envelope on the patient's door and document the percentage of each meal item consumed on the meal ticket and place in the envelope. Please include supplements and food brought from outside the hospital. Calorie count will not result until next week.   Augustin Shams MS, RD, LDN If unable to be reached, please send secure chat to RD inpatient available from 8:00a-4:00p daily

## 2024-02-05 NOTE — Progress Notes (Signed)
 Advanced Heart Failure Rounding Note  Cardiologist: Alm Clay, MD  Chief Complaint: Abdominal Discomfort Subjective:   9/9 PTCA LAD. A fib RVR. Placed on Amio drip. CO-OX 44 %. Started on milrinone  0.25 mcg.  9/10:  EGD and flex sig 9/10.  EGD with 1 superficial esophageal ulcer, LA grade C reflux esophagitis with no bleeding. Flex sig: Red blood in the rectum, sigmoid colon, descending colon and transverse colon. Few diverticula found. No obvious source of bleeding.   CVP improving, urine output picked up nicely after starting IV lasix  gtt and increasing milrinone . Coox up to 66 this morning. Increasing diet to softs.  Objective:   Weight Range: 98.4 kg Body mass index is 29.42 kg/m.   Vital Signs:   Temp:  [97 F (36.1 C)-98.4 F (36.9 C)] 97.6 F (36.4 C) (09/13 0700) Pulse Rate:  [85-168] 88 (09/13 1100) Resp:  [16-30] 23 (09/13 1100) BP: (83-130)/(54-81) 93/71 (09/13 1100) SpO2:  [82 %-100 %] 100 % (09/13 1100) Weight:  [98.4 kg] 98.4 kg (09/13 0600) Last BM Date : 02/04/24  Weight change: Filed Weights   02/01/24 0500 02/02/24 0421 02/05/24 0600  Weight: 95.9 kg 102.5 kg 98.4 kg    Intake/Output:   Intake/Output Summary (Last 24 hours) at 02/05/2024 1133 Last data filed at 02/05/2024 1100 Gross per 24 hour  Intake 2630.44 ml  Output 6950 ml  Net -4319.56 ml    CVP 22  Physical Exam    General:  elderly appearing.  No respiratory difficulty Neck: JVD to mid neck Cor: Irregular rate & rhythm. Tachycardic Lungs: diminished Extremities: +2 BLE edema to thigh Neuro: alert & oriented x 3. Affect pleasant.   Telemetry  A fib low 100s (Personally reviewed)    Labs    CBC Recent Labs    02/04/24 0504 02/05/24 0554  WBC 9.6 6.5  HGB 9.5* 8.5*  HCT 29.0* 25.9*  MCV 87.6 104.4*  PLT 335 345   Basic Metabolic Panel Recent Labs    90/88/74 0655 02/03/24 0816 02/04/24 0504 02/04/24 1724 02/05/24 0835  NA  --    < > 132*  132* 131* 129*  K   --    < > 4.1  4.1 3.7 3.7  CL  --    < > 97*  96* 96* 92*  CO2  --    < > 23  24 25 26   GLUCOSE  --    < > 202*  181* 104* 322*  BUN  --    < > 41*  42* 43* 43*  CREATININE  --    < > 1.20  1.20 1.17 1.20  CALCIUM   --    < > 8.0*  8.1* 8.3* 8.2*  MG 2.9*  --  2.1  --   --   PHOS 8.5*  --  5.8*  --  3.8   < > = values in this interval not displayed.   Liver Function Tests Recent Labs    02/04/24 0504 02/05/24 0835  AST 35 29  ALT 25 26  ALKPHOS 54 56  BILITOT 0.2 0.4  PROT 5.0* 5.3*  ALBUMIN 2.0*  2.0* 2.1*    Medications:   Scheduled Medications:  Chlorhexidine  Gluconate Cloth  6 each Topical Q0600   digoxin   0.0625 mg Oral Daily   feeding supplement  237 mL Oral TID BM   Gerhardt's butt cream   Topical BID   insulin  aspart  0-15 Units Subcutaneous Q4H   melatonin  5 mg Oral QHS   nystatin    Topical BID   pantoprazole  (PROTONIX ) IV  40 mg Intravenous Q12H   potassium chloride   40 mEq Oral Q6H   sodium chloride  flush  10-40 mL Intracatheter Q12H   sodium chloride  flush  3 mL Intravenous Q12H   spironolactone   25 mg Oral Daily   ticagrelor   90 mg Oral BID   traZODone   50 mg Oral QHS    Infusions:  amiodarone  30 mg/hr (02/05/24 1100)   famotidine  (PEPCID ) IV Stopped (02/05/24 1058)   furosemide      furosemide  (LASIX ) 200 mg in dextrose  5 % 100 mL (2 mg/mL) infusion Stopped (02/05/24 1025)   milrinone  0.375 mcg/kg/min (02/05/24 1100)   ondansetron  (ZOFRAN ) IV     TPN ADULT (ION) 61 mL/hr at 02/05/24 1100    PRN Medications: acetaminophen , ALPRAZolam , HYDROmorphone  (DILAUDID ) injection, ondansetron  (ZOFRAN ) IV, mouth rinse, polyethylene glycol, sodium chloride  flush, sodium chloride  flush  Patient Profile  Chase Combs is a 78 year old with a history of ICM, and chronic biventricular HFrEF.      Readmitted 01/22/24 with abdominal pain/nausea/vomiting. Hospital complicated by ischemic colitis and septic shock/GI bleed.  Assessment/Plan  CAD, Instent  Stenosis 01/17/24- PCI/Des LAD  9/9 Cangrelor  stopped given Plavix  load. Later developed acute chest pain. Returned urgently to cath lab. PTCA LAD. - Plavix  Genetic Testing collected, pending still - Continue high intensity statin - Doing well on ticagrelor  90mg  BID - Off aspirin  with GI bleed. May need to transition to plavix  in the future pending genetic testing and OAC plan   New A fib RVR - Rate better controlled.  - Continue amio drip at 30 - Continue SCDs.  - Plan for eventual heparin  drip as tolerated - Continue digoxin  0.0625   A/C HFrEF, ICM   01/13/24 Echo EF  20-25% RV moderately reduced. Repeat ECHO   - CVP improving, continue lasix  gtt at 20 - CO-OX 66%. Lactic Acid WNL.  - Continue milrinone  0.375 mcg.   - Off SGLT2i with fungal rash.  - Continue spiro 25 mg daily - Continue digoxin  0.0625 mcg daily - Follow renal function.    Anemia in the setting of GI Bleed -Possible bleeding from rectal tube. Off aspirin   -Transfused 9/8 with appropriate rise.  - 9 /10 he had large bloody BM. Unfortunately given 600 mg Ibuprofen .  - hgb down today to 8.5 but MCV increased? Repeat later today - Continue IV Protonix . Continue pepcid  and carafate .  - abd CT with no GIB. Possible infectious/inflammatory colitis - GI following.  - S/p EGD and flex sig.  EGD with 1 superficial esophageal ulcer, LA grade C reflux esophagitis with no bleeding. Flex sig: Red blood in the rectum, sigmoid colon, descending colon and transverse colon. Few diverticula found. No obvious source of bleeding. Plan for repeat colonoscopy if bleeding persists.  - Hgb stable. No further bleeding.    Septic Shock  On admit, ischemic colitis. Treated with antibiotics. Improved   DNR     Length of Stay: 14  Chase JINNY Brownie, MD  02/05/2024, 11:33 AM  Advanced Heart Failure Team Pager [phone] (M-F; 7a - 5p)  Please contact [facility] for night-coverage after hours (5p -7a ) and weekends on [email or web address]

## 2024-02-05 NOTE — Progress Notes (Signed)

## 2024-02-06 DIAGNOSIS — K559 Vascular disorder of intestine, unspecified: Secondary | ICD-10-CM | POA: Diagnosis not present

## 2024-02-06 DIAGNOSIS — I5022 Chronic systolic (congestive) heart failure: Secondary | ICD-10-CM | POA: Diagnosis not present

## 2024-02-06 DIAGNOSIS — I2510 Atherosclerotic heart disease of native coronary artery without angina pectoris: Secondary | ICD-10-CM | POA: Diagnosis not present

## 2024-02-06 LAB — GLUCOSE, CAPILLARY
Glucose-Capillary: 160 mg/dL — ABNORMAL HIGH (ref 70–99)
Glucose-Capillary: 160 mg/dL — ABNORMAL HIGH (ref 70–99)
Glucose-Capillary: 177 mg/dL — ABNORMAL HIGH (ref 70–99)
Glucose-Capillary: 127 mg/dL — ABNORMAL HIGH (ref 70–99)
Glucose-Capillary: 166 mg/dL — ABNORMAL HIGH (ref 70–99)
Glucose-Capillary: 164 mg/dL — ABNORMAL HIGH (ref 70–99)

## 2024-02-06 LAB — COMPREHENSIVE METABOLIC PANEL WITH GFR
ALT: 31 U/L (ref 0–44)
AST: 35 U/L (ref 15–41)
Albumin: 2.3 g/dL — ABNORMAL LOW (ref 3.5–5.0)
Alkaline Phosphatase: 64 U/L (ref 38–126)
Anion gap: 14 (ref 5–15)
BUN: 47 mg/dL — ABNORMAL HIGH (ref 8–23)
CO2: 31 mmol/L (ref 22–32)
Calcium: 8.7 mg/dL — ABNORMAL LOW (ref 8.9–10.3)
Chloride: 91 mmol/L — ABNORMAL LOW (ref 98–111)
Creatinine, Ser: 1.23 mg/dL (ref 0.61–1.24)
GFR, Estimated: >60 mL/min (ref >60)
Glucose, Bld: 153 mg/dL — ABNORMAL HIGH (ref 70–99)
Potassium: 3.6 mmol/L (ref 3.5–5.1)
Sodium: 136 mmol/L (ref 135–145)
Total Bilirubin: 0.5 mg/dL (ref 0.0–1.2)
Total Protein: 5.4 g/dL — ABNORMAL LOW (ref 6.5–8.1)

## 2024-02-06 LAB — CBC
HCT: 28.8 % — ABNORMAL LOW (ref 39.0–52.0)
Hemoglobin: 9.4 g/dL — ABNORMAL LOW (ref 13.0–17.0)
MCH: 28.1 pg (ref 26.0–34.0)
MCHC: 32.6 g/dL (ref 30.0–36.0)
MCV: 86 fL (ref 80.0–100.0)
Platelets: 460 K/uL — ABNORMAL HIGH (ref 150–400)
RBC: 3.35 MIL/uL — ABNORMAL LOW (ref 4.22–5.81)
RDW: 19.2 % — ABNORMAL HIGH (ref 11.5–15.5)
WBC: 6.5 K/uL (ref 4.0–10.5)
nRBC: 0.8 % — ABNORMAL HIGH (ref 0.0–0.2)

## 2024-02-06 LAB — COOXEMETRY PANEL
Carboxyhemoglobin: 1.5 % (ref 0.5–1.5)
Methemoglobin: <0.7 % (ref 0.0–1.5)
O2 Saturation: 56.2 %
Total hemoglobin: 9.7 g/dL — ABNORMAL LOW (ref 12.0–16.0)

## 2024-02-06 LAB — PHOSPHORUS: Phosphorus: 3.8 mg/dL (ref 2.5–4.6)

## 2024-02-06 LAB — MAGNESIUM: Magnesium: 1.9 mg/dL (ref 1.7–2.4)

## 2024-02-06 MED ORDER — ALTEPLASE 2 MG IJ SOLR
2.0000 mg | Freq: Once | INTRAMUSCULAR | Status: AC
  Administered 2024-02-06: 2 mg
  Filled 2024-02-06: qty 2

## 2024-02-06 MED ORDER — MAGNESIUM SULFATE IN D5W 1-5 GM/100ML-% IV SOLN
1.0000 g | Freq: Once | INTRAVENOUS | Status: AC
  Administered 2024-02-06: 1 g via INTRAVENOUS
  Filled 2024-02-06: qty 100

## 2024-02-06 MED ORDER — POTASSIUM CHLORIDE CRYS ER 20 MEQ PO TBCR
40.0000 meq | EXTENDED_RELEASE_TABLET | ORAL | Status: AC
  Administered 2024-02-06 (×2): 40 meq via ORAL
  Filled 2024-02-06 (×2): qty 2

## 2024-02-06 MED ORDER — POTASSIUM CHLORIDE CRYS ER 20 MEQ PO TBCR
40.0000 meq | EXTENDED_RELEASE_TABLET | Freq: Once | ORAL | Status: AC
Start: 2024-02-06 — End: 2024-02-06
  Administered 2024-02-06: 40 meq via ORAL
  Filled 2024-02-06: qty 2

## 2024-02-06 MED ORDER — TRACE MINERALS CU-MN-SE-ZN 300-55-60-3000 MCG/ML IV SOLN
INTRAVENOUS | Status: AC
  Filled 2024-02-06: qty 323.07

## 2024-02-06 MED ORDER — MAGNESIUM SULFATE 2 GM/50ML IV SOLN
2.0000 g | Freq: Once | INTRAVENOUS | Status: AC
  Administered 2024-02-06: 2 g via INTRAVENOUS
  Filled 2024-02-06: qty 50

## 2024-02-06 NOTE — Progress Notes (Signed)
 PHARMACY - TOTAL PARENTERAL NUTRITION CONSULT NOTE  Indication: Prolonged ileus  Patient Measurements: Height: 6' (182.9 cm) Weight: 91.4 kg (201 lb 8 oz) IBW/kg (Calculated) : 77.6 TPN AdjBW (KG): 91.1 Body mass index is 27.33 kg/m.  Assessment:  [name] presented on 8/30 with abdominal pain, found to have adynamic ileus and possible ischemic colitis.  Patient stated his last meal was on 8/30 and was likely losing weight PTA, but unable to quantify.  He was started on a clear liquid diet on 9/3 PM and hasn't been able to advance.  Pharmacy consulted to manage TPN.    Glucose / Insulin : no hx DM - CBGs < 180 Used 12 units SSI in the past 24 hrs and 20 units in TPN Electrolytes: low CL (max CL in TPN), K 3.6 post KCL x2 (goal >/= 4, has required a significant amount of KCL runs upon admission), Phos down to 3.8 (none in TPN), Mag down to 1.9 (goal >/= 2), others WNL Renal: SCr 1.23, BUN up to 47 Hepatic: LFTs normalized, tbili / TG WNL, albumin 2.3 Intake / Output; MIVF: UOP 4.4 mL/kg/hr (Lasix  changed to infusion at 10 ml/hr on 9/12, metolazone  5mg  PO x1 on 9/12), LBM 9/13 GI Imaging: 9/5 KUB: diffuse gaseous distension of entire colon GI Surgeries / Procedures: none since TPN initiation   Central access: PICC placed 01/24/24 TPN start date: 01/28/24  Nutritional Goals: Goal TPN is 80 mL/hr (= 100g AA and 1965 kCal per day) Goal concentrated TPN is 61 ml/hr (= 99g AA and 1922 kCal per day)  RD Estimated Needs Total Energy Estimated Needs: 1900-2100 kcals Total Protein Estimated Needs: 95-110g Total Fluid Estimated Needs: 1.9-2.1L/day  Current Nutrition:  TPN Dysphagia 3 diet started 9/13 - ate 50% of dinner and 90% of breakfast Ensure Plus High Protein TID - 1 charted given 9/13  Plan:  Half TPN per CCM - TPN at 30 ml/hr at 1800  Electrolytes in TPN: Na 75 mEq/L, K 68 mEq/L, reduce Ca to 70mEq/L given less TPN volume, Mg 15 mEq/L (max in TPN), Phos 0 mmol/L since 9/11, max  CL  Continue standard MVI and trace elements to TPN Continue resistant SSI Q4H to cover for PO intake Reduce regular insulin  in TPN to 10 units with reduce TPN rate KCL 40mEq PO x 3 and Mag sulfate 3gm IV to account for Lasix  and reduced electrolytes in TPN with reduced TPN rate -Lasix  may stop 9/14 evening Monitor TPN labs on Mon/Thurs F/u PO intake and D/C TPN when clinically appropriate   Jnae Thomaston D. Lendell, PharmD, BCPS, BCCCP 02/06/2024, 10:53 AM

## 2024-02-06 NOTE — Progress Notes (Signed)
 NAME:  Chase Combs, MRN:  [id number], DOB:  Dec 01, 1945, LOS: 15 ADMISSION DATE:  01/22/2024, CONSULTATION DATE:  01/22/24 REFERRING MD:  EDP, CHIEF COMPLAINT:  recurrent STEMI  History of Present Illness:  78 year old man w/ hx of ischemic cardiomyopathy and very recent LAD stent placement for NSTEMI discharged 8/27 (no d/c summary so unclear) with progressive nausea vomiting with CT images concerning for ischemic colitis.  Feeling weak and unwell since discharge 3 days ago.  Progressive nausea and vomiting not keeping down his antiplatelet medicines.  CT images reveal intestinal air in the wall of the intestine as well as concerning findings of ileus.  His lactate was elevated.  He was volume resuscitated.  Remained hypotensive.  Placed on broad-spectrum antibiotics he received vancomycin  Zosyn  doxycycline and Flagyl .  Labs notable for acute renal failure.  Anion gap metabolic acidosis.  Leukopenia, lymphopenia.  Had previously been sent out of ICU. Had ileus requiring TPN, now on clears. Had GIB requiring 2 units pRBC in the past few days, never able to be scoped due to need for need for ongoing AP therapy. This morning his cangrelor  was d/c and plavix  was started; several hours later he developed sudden onset shoulder pain, diaphoresis. EKG with anterior ST elevations. Taken for [facility]. Transferring to the ICU post-procedure. Afib with RVR in LHC, no on amiodarone . LVEDP 28. Heparin  1200 units during cath.  Pertinent  Medical History  CAD, CHF  Significant Hospital Events: Including procedures, antibiotic start and stop dates in addition to other pertinent events   8/30 admitted with pneumatosis intestinalis/ileus.  General surgery recommend conservative management 9/8 1 unit pRBC 9/9 1 unit pRBC, switched off kangrelor to plavix > recurrent STEMI with in-stent stenosis. Started milrinone  for low coox, high CVP & LVEDP after. 9/11 No further bleeding overnight, Hgb stable, GI eval without clear  source of bleeding  9/12 hemodynamics stable. Blood work stable Adjusting GDMT. Advancing diet to full liq.   Interim History / Subjective:  Has been eating more, drinking an Ensure now. No nausea. Has walked 70 feet yesterday, no walking today. 1 non-bloody BM overnight.   Objective    Blood pressure 97/70, pulse 91, temperature (!) 97.2 F (36.2 C), temperature source Axillary, resp. rate (!) 21, height 6' (1.829 m), weight 91.4 kg, SpO2 100%. CVP:  [9 mmHg-13 mmHg] 9 mmHg      Intake/Output Summary (Last 24 hours) at 02/06/2024 1059 Last data filed at 02/06/2024 1000 Gross per 24 hour  Intake 3091.07 ml  Output 8550 ml  Net -5458.93 ml  Net for admission +14L  Filed Weights   02/02/24 0421 02/05/24 0600 02/06/24 0500  Weight: 102.5 kg 98.4 kg 91.4 kg   General chronically ill appearing man sitting up in the chair in NAD HENT [place]/AT, eyes anicteric Pulm breathing comfortably on RA, no basilar rhales Cardiac: S1S2, RRR Abd more distended, hypoactive bowel sounds. NT to palpation. Neuro awake, alert, moving all extremities Extremities improving edema, no cyanosis  Na+ 136 K+ 3.6 BUN 47 Cr 1.23 WBC 6.5 H/H 9.4/28.8 Platelets 460    Resolved problem list  Severe sepsis Lactic acidosis AKI due to septic ATN  Hypokalemia hyponatremia  Assessment and Plan   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; concern for plavix  failure, s/p LHC with balloon dilation 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 with cardiogenic shock -Brilinta  BID; presumed previous plavix  failure -holding aspirin  -con't statin -serial coox; decreasing milrinone  per AHF -switching back to bolus diuretics -con't spiro,  digoxin  -GDMT limited due to shock, soft Bps -tele monitoring -monitor electrolytes and replete as needed  Afib with RVR- rate controlled -con't amidarone -optimize volume status -holding AC due to bleeding, recommend rechallenging after about 2 weeks  Intestinal pneumatosis  concerning for ischemic vs infectious colitis: Severe arthrosclerotic disease as evidenced by severe CAD. Felt more ischemic than infectious; potentially post-MI related cardiomyopathy. Severe ileus> improving, but more distended today -con't mechanical soft diet -kcal count -decrease TPN to half dose; if tolerating diet today without vomiting would stop TPN tomorrow  ABLA, GIB - most likely due to esophageal and gastric ulcers on EGD, although nonbleeding by the time of the scope. Stool nonbloody now.  -transfuse for Hb <7 or hemodynamically significant bleeding -avoid NSAIDs -con't holding AC for about 2 weeks after bleeding is controlled -PPI & H2 blocker -needs OP colonoscopy in about 6 months   Hyperglycemia; controlled. Prediabetes, A1c 6.4. -con't insulin  in TPN per pharmacy's recommendations; need to transition back to basal bolus once TPN rate decreases and diet increases -SSI PRN -goal BG 140-180  Deconditioning -PT, OT, con't OOB mobility  -CIR consulted for evaluation  Family at bedside updated during rounds.   [name], DO 02/06/24 11:22 AM [facility]  For contact information, see Amion. If no response to pager, please call PCCM consult pager. After hours, 7PM- 7AM, please call Elink.

## 2024-02-06 NOTE — Progress Notes (Signed)
 Advanced Heart Failure Rounding Note  Cardiologist: Alm Clay, MD  Chief Complaint: Abdominal Discomfort Subjective:   9/9 PTCA LAD. A fib RVR. Placed on Amio drip. CO-OX 44 %. Started on milrinone  0.25 mcg.  9/10:  EGD and flex sig 9/10.  EGD with 1 superficial esophageal ulcer, LA grade C reflux esophagitis with no bleeding. Flex sig: Red blood in the rectum, sigmoid colon, descending colon and transverse colon. Few diverticula found. No obvious source of bleeding.   Excellent urine output yesterday on lasix  gtt, BUN and creatinine largely stable, CO2 up to 31. Still with bloating, slow gastric transit, but tolerated transition to soft diet. Hopefully off TPN tomorrow. Floor status.  Objective:   Weight Range: 91.4 kg Body mass index is 27.33 kg/m.   Vital Signs:   Temp:  [96.9 F (36.1 C)-97.7 F (36.5 C)] 97.6 F (36.4 C) (09/14 1115) Pulse Rate:  [79-120] 97 (09/14 1200) Resp:  [15-32] 29 (09/14 1200) BP: (96-124)/(64-89) 124/75 (09/14 1200) SpO2:  [95 %-100 %] 100 % (09/14 1200) Weight:  [91.4 kg] 91.4 kg (09/14 0500) Last BM Date : 02/05/24  Weight change: Filed Weights   02/02/24 0421 02/05/24 0600 02/06/24 0500  Weight: 102.5 kg 98.4 kg 91.4 kg    Intake/Output:   Intake/Output Summary (Last 24 hours) at 02/06/2024 1509 Last data filed at 02/06/2024 1400 Gross per 24 hour  Intake 3066.33 ml  Output 7650 ml  Net -4583.67 ml    CVP 22  Physical Exam    General:  elderly appearing.  No respiratory difficulty Neck: JVD mildly elevated Cor: Irregular rate & rhythm. Tachycardic, no murmur,  Lungs: diminished Extremities: Trace BLE edema to thigh Neuro: alert & oriented x 3. Affect pleasant.   Telemetry  A fib low 100s (Personally reviewed)    Labs    CBC Recent Labs    02/05/24 0554 02/06/24 0413  WBC 6.5 6.5  HGB 8.5* 9.4*  HCT 25.9* 28.8*  MCV 104.4* 86.0  PLT 345 460*   Basic Metabolic Panel Recent Labs    90/86/74 1120  02/06/24 0413  NA 131* 136  K 3.8 3.6  CL 93* 91*  CO2 24 31  GLUCOSE 123* 153*  BUN 46* 47*  CREATININE 1.20 1.23  CALCIUM  8.7* 8.7*  MG 2.0 1.9  PHOS 4.0 3.8   Liver Function Tests Recent Labs    02/05/24 0835 02/06/24 0413  AST 29 35  ALT 26 31  ALKPHOS 56 64  BILITOT 0.4 0.5  PROT 5.3* 5.4*  ALBUMIN 2.1* 2.3*    Medications:   Scheduled Medications:  Chlorhexidine  Gluconate Cloth  6 each Topical Q0600   digoxin   0.0625 mg Oral Daily   feeding supplement  237 mL Oral TID BM   Gerhardt's butt cream   Topical BID   insulin  aspart  0-20 Units Subcutaneous Q4H   melatonin  5 mg Oral QHS   nystatin    Topical BID   pantoprazole  (PROTONIX ) IV  40 mg Intravenous Q12H   potassium chloride   40 mEq Oral Q4H   sodium chloride  flush  10-40 mL Intracatheter Q12H   sodium chloride  flush  3 mL Intravenous Q12H   spironolactone   25 mg Oral Daily   ticagrelor   90 mg Oral BID   traZODone   50 mg Oral QHS    Infusions:  amiodarone  30 mg/hr (02/06/24 1400)   famotidine  (PEPCID ) IV Stopped (02/06/24 1021)   furosemide      furosemide  (LASIX ) 200 mg in  dextrose  5 % 100 mL (2 mg/mL) infusion 20 mg/hr (02/06/24 1400)   milrinone  0.25 mcg/kg/min (02/06/24 1400)   ondansetron  (ZOFRAN ) IV     TPN ADULT (ION) 61 mL/hr at 02/06/24 1400   TPN ADULT (ION)      PRN Medications: acetaminophen , ALPRAZolam , HYDROmorphone  (DILAUDID ) injection, ondansetron  (ZOFRAN ) IV, mouth rinse, polyethylene glycol, sodium chloride  flush, sodium chloride  flush  Patient Profile  Chase Combs is a 78 year old with a history of ICM, and chronic biventricular HFrEF.      Readmitted 01/22/24 with abdominal pain/nausea/vomiting. Hospital complicated by ischemic colitis and septic shock/GI bleed.  Assessment/Plan  CAD, Instent Stenosis 01/17/24- PCI/Des LAD  9/9 Cangrelor  stopped given Plavix  load. Later developed acute chest pain. Returned urgently to cath lab. PTCA LAD. - Plavix  Genetic Testing collected,  pending - Continue high intensity statin - Continue ticagrelor  90mg  BID - Further OAC and plavix  plan pending GI recovery, genotyping - Off aspirin  with GI bleed. May need to transition to plavix  in the future pending genetic testing and OAC plan   New A fib RVR - Rate better controlled.  - Continue amio drip at 30 while on amiodarone , can hopefully transition to PO soon - No DCCV with recent bleed - Continue SCDs.  - Plan for eventual heparin  drip, hopefully next week - Continue digoxin  0.0625   A/C HFrEF, ICM   01/13/24 Echo EF  20-25% RV moderately reduced. Repeat ECHO   - CVP improving, now 12-13, continue lasix  gtt at 20 - CO-OX 53%. He appears warm on exam, otherwise doing well. Will decrease to 0.25 today.  - Off SGLT2i with fungal rash.  - Continue spiro 25 mg daily - Continue digoxin  0.0625 mcg daily - Follow renal function.    Anemia in the setting of GI Bleed -Possible bleeding from rectal tube. Off aspirin   -Transfused 9/8 with appropriate rise.  - 9 /10 he had large bloody BM. Unfortunately given 600 mg Ibuprofen .  - hgb stable - Continue IV Protonix . Continue pepcid  and carafate .  - abd CT with no GIB. Possible infectious/inflammatory colitis - GI following.  - S/p EGD and flex sig.  EGD with 1 superficial esophageal ulcer, LA grade C reflux esophagitis with no bleeding. Flex sig: Red blood in the rectum, sigmoid colon, descending colon and transverse colon. Few diverticula found. No obvious source of bleeding. Plan for repeat colonoscopy if bleeding persists.    Septic Shock  On admit, ischemic colitis. Treated with antibiotics. Improved   DNR     Length of Stay: 15  Chase JINNY Brownie, MD  02/06/2024, 3:09 PM  Advanced Heart Failure Team Pager [phone] (M-F; 7a - 5p)  Please contact [facility] for night-coverage after hours (5p -7a ) and weekends on [email or web address]

## 2024-02-06 NOTE — Progress Notes (Signed)
 eLink Physician-Brief Progress Note Patient Name: HIRO VIPOND DOB: 02-23-1946 MRN: [id number]   Date of Service  02/06/2024  HPI/Events of Note  K3.6, Mg 1.9  eICU Interventions  Kcl, magnesium  sulfate TPN adjustment per pharmacy/dietary     Intervention Category Minor Interventions: Electrolytes abnormality - evaluation and management  Briggette Najarian 02/06/2024, 5:47 AM

## 2024-02-07 ENCOUNTER — Inpatient Hospital Stay (HOSPITAL_COMMUNITY)

## 2024-02-07 DIAGNOSIS — M24561 Contracture, right knee: Secondary | ICD-10-CM | POA: Diagnosis not present

## 2024-02-07 DIAGNOSIS — I5023 Acute on chronic systolic (congestive) heart failure: Secondary | ICD-10-CM | POA: Diagnosis not present

## 2024-02-07 DIAGNOSIS — I2510 Atherosclerotic heart disease of native coronary artery without angina pectoris: Secondary | ICD-10-CM | POA: Diagnosis not present

## 2024-02-07 DIAGNOSIS — R570 Cardiogenic shock: Secondary | ICD-10-CM

## 2024-02-07 DIAGNOSIS — M24511 Contracture, right shoulder: Secondary | ICD-10-CM | POA: Diagnosis not present

## 2024-02-07 DIAGNOSIS — K5981 Ogilvie syndrome: Secondary | ICD-10-CM

## 2024-02-07 DIAGNOSIS — K559 Vascular disorder of intestine, unspecified: Secondary | ICD-10-CM | POA: Diagnosis not present

## 2024-02-07 DIAGNOSIS — R5381 Other malaise: Secondary | ICD-10-CM

## 2024-02-07 DIAGNOSIS — I4891 Unspecified atrial fibrillation: Secondary | ICD-10-CM | POA: Diagnosis not present

## 2024-02-07 DIAGNOSIS — I5021 Acute systolic (congestive) heart failure: Secondary | ICD-10-CM

## 2024-02-07 LAB — COOXEMETRY PANEL
Carboxyhemoglobin: 1.4 % (ref 0.5–1.5)
Carboxyhemoglobin: 0.8 % (ref 0.5–1.5)
Methemoglobin: <0.7 % (ref 0.0–1.5)
Methemoglobin: 0.8 % (ref 0.0–1.5)
O2 Saturation: 53.4 %
O2 Saturation: 42.6 %
Total hemoglobin: 10.6 g/dL — ABNORMAL LOW (ref 12.0–16.0)
Total hemoglobin: 11.8 g/dL — ABNORMAL LOW (ref 12.0–16.0)

## 2024-02-07 LAB — GLUCOSE, CAPILLARY
Glucose-Capillary: 169 mg/dL — ABNORMAL HIGH (ref 70–99)
Glucose-Capillary: 142 mg/dL — ABNORMAL HIGH (ref 70–99)
Glucose-Capillary: 125 mg/dL — ABNORMAL HIGH (ref 70–99)
Glucose-Capillary: 87 mg/dL (ref 70–99)
Glucose-Capillary: 218 mg/dL — ABNORMAL HIGH (ref 70–99)
Glucose-Capillary: 125 mg/dL — ABNORMAL HIGH (ref 70–99)

## 2024-02-07 LAB — CBC
HCT: 31.2 % — ABNORMAL LOW (ref 39.0–52.0)
Hemoglobin: 10.1 g/dL — ABNORMAL LOW (ref 13.0–17.0)
MCH: 28 pg (ref 26.0–34.0)
MCHC: 32.4 g/dL (ref 30.0–36.0)
MCV: 86.4 fL (ref 80.0–100.0)
Platelets: 536 K/uL — ABNORMAL HIGH (ref 150–400)
RBC: 3.61 MIL/uL — ABNORMAL LOW (ref 4.22–5.81)
RDW: 19.4 % — ABNORMAL HIGH (ref 11.5–15.5)
WBC: 7.7 K/uL (ref 4.0–10.5)
nRBC: 0 % (ref 0.0–0.2)

## 2024-02-07 LAB — COMPREHENSIVE METABOLIC PANEL WITH GFR
ALT: 32 U/L (ref 0–44)
AST: 30 U/L (ref 15–41)
Albumin: 2.4 g/dL — ABNORMAL LOW (ref 3.5–5.0)
Alkaline Phosphatase: 71 U/L (ref 38–126)
Anion gap: 16 — ABNORMAL HIGH (ref 5–15)
BUN: 52 mg/dL — ABNORMAL HIGH (ref 8–23)
CO2: 33 mmol/L — ABNORMAL HIGH (ref 22–32)
Calcium: 8.7 mg/dL — ABNORMAL LOW (ref 8.9–10.3)
Chloride: 88 mmol/L — ABNORMAL LOW (ref 98–111)
Creatinine, Ser: 1.25 mg/dL — ABNORMAL HIGH (ref 0.61–1.24)
GFR, Estimated: 60 mL/min — ABNORMAL LOW (ref >60)
Glucose, Bld: 204 mg/dL — ABNORMAL HIGH (ref 70–99)
Potassium: 4.1 mmol/L (ref 3.5–5.1)
Sodium: 137 mmol/L (ref 135–145)
Total Bilirubin: 0.5 mg/dL (ref 0.0–1.2)
Total Protein: 5.6 g/dL — ABNORMAL LOW (ref 6.5–8.1)

## 2024-02-07 LAB — TRIGLYCERIDES: Triglycerides: 97 mg/dL (ref <150)

## 2024-02-07 LAB — MAGNESIUM: Magnesium: 2.1 mg/dL (ref 1.7–2.4)

## 2024-02-07 LAB — PHOSPHORUS: Phosphorus: 4 mg/dL (ref 2.5–4.6)

## 2024-02-07 MED ORDER — HYDRALAZINE HCL 20 MG/ML IJ SOLN: 10.0000 mg | INTRAMUSCULAR | Status: DC | PRN

## 2024-02-07 MED ORDER — ADULT MULTIVITAMIN W/MINERALS CH
1.0000 | ORAL_TABLET | Freq: Every day | ORAL | Status: DC
  Administered 2024-02-07 – 2024-02-25 (×19): 1 via ORAL
  Filled 2024-02-07 (×20): qty 1

## 2024-02-07 MED ORDER — METOPROLOL TARTRATE 5 MG/5ML IV SOLN: 5.0000 mg | INTRAVENOUS | Status: DC | PRN

## 2024-02-07 MED ORDER — ENSURE PLUS HIGH PROTEIN PO LIQD
237.0000 mL | Freq: Three times a day (TID) | ORAL | Status: DC
  Administered 2024-02-08 – 2024-02-16 (×11): 237 mL via ORAL

## 2024-02-07 MED ORDER — INFLUENZA VAC SPLIT HIGH-DOSE 0.5 ML IM SUSY
0.5000 mL | PREFILLED_SYRINGE | INTRAMUSCULAR | Status: DC
  Filled 2024-02-07: qty 0.5

## 2024-02-07 MED ORDER — BOOST PLUS PO LIQD
237.0000 mL | Freq: Three times a day (TID) | ORAL | Status: DC
  Filled 2024-02-07: qty 237

## 2024-02-07 MED ORDER — ACETAZOLAMIDE 250 MG PO TABS
500.0000 mg | ORAL_TABLET | Freq: Two times a day (BID) | ORAL | Status: DC
  Administered 2024-02-07 – 2024-02-09 (×5): 500 mg via ORAL
  Filled 2024-02-07 (×5): qty 2

## 2024-02-07 MED ORDER — PNEUMOCOCCAL 20-VAL CONJ VACC 0.5 ML IM SUSY
0.5000 mL | PREFILLED_SYRINGE | INTRAMUSCULAR | Status: DC
Start: 2024-02-08 — End: 2024-02-11
  Filled 2024-02-07: qty 0.5

## 2024-02-07 MED ORDER — IPRATROPIUM-ALBUTEROL 0.5-2.5 (3) MG/3ML IN SOLN: 3.0000 mL | RESPIRATORY_TRACT | Status: DC | PRN

## 2024-02-07 NOTE — Progress Notes (Signed)
 Inpatient Rehab Coordinator Note:  I met with patient at bedside to discuss CIR recommendations and goals/expectations of CIR stay.  We reviewed 3 hrs/day of therapy, physician follow up, and average length of stay 2 weeks (dependent upon progress) with goals of tbd.  Currently mobilizing well with little need for physical assist, but demonstrates decreased cardiovascular and muscular endurance.  Still with multiple medical barriers to CIR (see PMR consult from today).  We will follow over the next few days to continue to assess candidacy.    Reche Lowers, PT, DPT Admissions Coordinator [phone] 02/07/24  3:33 PM

## 2024-02-07 NOTE — Progress Notes (Signed)
 Advanced Heart Failure Rounding Note  Cardiologist: Alm Clay, MD  Chief Complaint: Abdominal Discomfort Subjective:   9/9 PTCA LAD. A fib RVR. Placed on Amio drip. CO-OX 44 %. Started on milrinone  0.25 mcg.  9/10:  EGD and flex sig 9/10.  EGD with 1 superficial esophageal ulcer, LA grade C reflux esophagitis with no bleeding. Flex sig: Red blood in the rectum, sigmoid colon, descending colon and transverse colon. Few diverticula found. No obvious source of bleeding.   Brisk diuresis noted on lasix  drip + milrinone  0.25 mcg.  Complaining of fatigue. Had BM today RN ? Reddish tinge.    Objective:   Weight Range: 88 kg Body mass index is 26.31 kg/m.   Vital Signs:   Temp:  [97.6 F (36.4 C)-98 F (36.7 C)] 97.6 F (36.4 C) (09/15 0734) Pulse Rate:  [79-108] 103 (09/15 0734) Resp:  [16-33] 16 (09/15 0734) BP: (98-124)/(67-82) 104/72 (09/15 0734) SpO2:  [93 %-100 %] 100 % (09/15 0734) Weight:  [88 kg] 88 kg (09/15 0434) Last BM Date : 02/07/24  Weight change: Filed Weights   02/05/24 0600 02/06/24 0500 02/07/24 0434  Weight: 98.4 kg 91.4 kg 88 kg    Intake/Output:   Intake/Output Summary (Last 24 hours) at 02/07/2024 0850 Last data filed at 02/07/2024 0615 Gross per 24 hour  Intake 1646.81 ml  Output 7250 ml  Net -5603.19 ml    CVP 16   Physical Exam   General:   No resp difficulty. In the chair.  Neck: CVP to jaw   Cor: Irregular rate & rhythm.  Lungs: clear Abdomen: soft, nontender, nondistended.  Extremities: no  edema Neuro: alert & oriented x3   Telemetry   A fib 90-110s   Labs    CBC Recent Labs    02/06/24 0413 02/07/24 0445  WBC 6.5 7.7  HGB 9.4* 10.1*  HCT 28.8* 31.2*  MCV 86.0 86.4  PLT 460* 536*   Basic Metabolic Panel Recent Labs    90/85/74 0413 02/07/24 0445  NA 136 137  K 3.6 4.1  CL 91* 88*  CO2 31 33*  GLUCOSE 153* 204*  BUN 47* 52*  CREATININE 1.23 1.25*  CALCIUM  8.7* 8.7*  MG 1.9 2.1  PHOS 3.8 4.0   Liver  Function Tests Recent Labs    02/06/24 0413 02/07/24 0445  AST 35 30  ALT 31 32  ALKPHOS 64 71  BILITOT 0.5 0.5  PROT 5.4* 5.6*  ALBUMIN 2.3* 2.4*    Medications:   Scheduled Medications:  Chlorhexidine  Gluconate Cloth  6 each Topical Q0600   digoxin   0.0625 mg Oral Daily   feeding supplement  237 mL Oral TID BM   Gerhardt's butt cream   Topical BID   [START ON 02/08/2024] Influenza vac split trivalent PF  0.5 mL Intramuscular Tomorrow-1000   insulin  aspart  0-20 Units Subcutaneous Q4H   melatonin  5 mg Oral QHS   nystatin    Topical BID   pantoprazole  (PROTONIX ) IV  40 mg Intravenous Q12H   [START ON 02/08/2024] pneumococcal 20-valent conjugate vaccine  0.5 mL Intramuscular Tomorrow-1000   sodium chloride  flush  10-40 mL Intracatheter Q12H   sodium chloride  flush  3 mL Intravenous Q12H   spironolactone   25 mg Oral Daily   ticagrelor   90 mg Oral BID   traZODone   50 mg Oral QHS    Infusions:  amiodarone  30 mg/hr (02/07/24 0559)   famotidine  (PEPCID ) IV Stopped (02/06/24 2330)   furosemide   furosemide  (LASIX ) 200 mg in dextrose  5 % 100 mL (2 mg/mL) infusion 20 mg/hr (02/07/24 0439)   milrinone  0.25 mcg/kg/min (02/07/24 0436)   ondansetron  (ZOFRAN ) IV     TPN ADULT (ION) 30 mL/hr at 02/07/24 0615    PRN Medications: acetaminophen , ALPRAZolam , hydrALAZINE , HYDROmorphone  (DILAUDID ) injection, ipratropium-albuterol , metoprolol  tartrate, ondansetron  (ZOFRAN ) IV, mouth rinse, polyethylene glycol, sodium chloride  flush, sodium chloride  flush  Patient Profile  Mr Naramore is a 78 year old with a history of ICM, and chronic biventricular HFrEF.      Readmitted 01/22/24 with abdominal pain/nausea/vomiting. Hospital complicated by ischemic colitis and septic shock/GI bleed.  Assessment/Plan  CAD, Instent Stenosis 01/17/24- PCI/Des LAD  9/9 Cangrelor  stopped given Plavix  load. Later developed acute chest pain. Returned urgently to cath lab. PTCA LAD. - Plavix  Genetic Testing  collected, pending - Continue high intensity statin - Continue ticagrelor  90mg  BID - Further OAC and plavix  plan pending GI recovery, genotyping - Off aspirin  with GI bleed. May need to transition to plavix  in the future pending genetic testing and OAC plan   New A fib RVR - Rate better controlled.  - Continue amio drip at 30 while on amiodarone , can hopefully transition to PO soon - No DCCV with recent bleed - Continue SCDs.  - Plan for eventual heparin  drip, hopefully next week - Continue digoxin  0.0625   A/C HFrEF, ICM   01/13/24 Echo EF  20-25% RV moderately reduced. Repeat ECHO   - CVP ~ 16.  Continue lasix  drip.  - CO-OX 53%. - Off SGLT2i with fungal rash.  - Continue spiro 25 mg daily - Continue digoxin  0.0625 mcg daily - Follow renal function.    Anemia in the setting of GI Bleed -Possible bleeding from rectal tube. Off aspirin   -Transfused 9/8 with appropriate rise.  - 9 /10 he had large bloody BM. Unfortunately given 600 mg Ibuprofen .  - Hgb stable. Had BM this morning ? Redish tinge.  - Continue IV Protonix . Continue pepcid  and carafate .  - abd CT with no GIB. Possible infectious/inflammatory colitis - GI following.  - S/p EGD and flex sig.  EGD with 1 superficial esophageal ulcer, LA grade C reflux esophagitis with no bleeding. Flex sig: Red blood in the rectum, sigmoid colon, descending colon and transverse colon. Few diverticula found. No obvious source of bleeding. Plan for repeat colonoscopy if bleeding persists.    Septic Shock  On admit, ischemic colitis. Treated with antibiotics. Improved   DNR     Length of Stay: 16  Bobette Leyh, NP  02/07/2024, 8:50 AM  Advanced Heart Failure Team Pager [phone] (M-F; 7a - 5p)  Please contact [facility] for night-coverage after hours (5p -7a ) and weekends on [email or web address]

## 2024-02-07 NOTE — Evaluation (Signed)
 Occupational Therapy Evaluation Patient Details Name: Chase Combs MRN: [id number] DOB: 1945-07-07 Today's Date: 02/07/2024   History of Present Illness   78 year old man admitted 8/30 with progressive nausea vomiting with CT images concerning for ischemic colitis; pneumatosis intestinalis/ileus. 9/9  recurrent STEMI with in-stent stenosis.[name]  PMH: ischemic cardiomyopathy and very recent LAD stent placement for NSTEMI discharged 8/27.     Clinical Impressions Pt agreeable to complete ADLs at this time while sitting at EOB with set up for UE and peri area. Pt then reported needing to have a BM and was able to step pivot with CGA and RW. Pt then required max assist with hygiene in standing position. Pt then wanted to go back to bed to rest. Patient will benefit from intensive inpatient follow-up therapy, >3 hours/day.     If plan is discharge home, recommend the following:   A little help with walking and/or transfers;A little help with bathing/dressing/bathroom;Assistance with cooking/housework;Assist for transportation;Help with stairs or ramp for entrance     Functional Status Assessment         Equipment Recommendations   Other (comment) (TBD at next venue but do not think any needs at this time)     Recommendations for Other Services         Precautions/Restrictions   Precautions Precautions: Fall Recall of Precautions/Restrictions: Intact Restrictions Weight Bearing Restrictions Per Provider Order: No Other Position/Activity Restrictions: monitor O2     Mobility Bed Mobility Overal bed mobility: Needs Assistance Bed Mobility: Supine to Sit, Sit to Supine     Supine to sit: Supervision Sit to supine: Supervision        Transfers Overall transfer level: Needs assistance Equipment used: Rolling walker (2 wheels) Transfers: Sit to/from Stand Sit to Stand: Contact guard assist     Step pivot transfers: Contact guard assist            Balance  Overall balance assessment: Needs assistance Sitting-balance support: Feet supported Sitting balance-Leahy Scale: Good     Standing balance support: Bilateral upper extremity supported Standing balance-Leahy Scale: Poor Standing balance comment: relies on UE support for balance bilaterally and use of RW.                           ADL either performed or assessed with clinical judgement   ADL Overall ADL's : Needs assistance/impaired Eating/Feeding: Independent;Sitting   Grooming: Set up;Sitting   Upper Body Bathing: Set up;Sitting   Lower Body Bathing: Minimal assistance;Sit to/from stand;Sitting/lateral leans;Contact guard assist   Upper Body Dressing : Set up;Sitting   Lower Body Dressing: Contact guard assist;Minimal assistance;Sit to/from stand   Toilet Transfer: Contact guard assist;Stand-pivot;Rolling walker (2 wheels);BSC/3in1   Toileting- Clothing Manipulation and Hygiene: Maximal assistance;Sit to/from stand       Functional mobility during ADLs: Contact guard assist;Rolling walker (2 wheels);Cueing for sequencing;Cueing for safety       Vision         Perception         Praxis         Pertinent Vitals/Pain Pain Assessment Pain Assessment: 0-10 Pain Score: 1  Facial Expression: Relaxed, neutral Body Movements: Absence of movements Muscle Tension: Relaxed Compliance with ventilator (intubated pts.): N/A Vocalization (extubated pts.): N/A CPOT Total: 0 Pain Location: buttocks Pain Descriptors / Indicators: Aching Pain Intervention(s): Limited activity within patient's tolerance, Monitored during session     Extremity/Trunk Assessment Upper Extremity Assessment Upper Extremity Assessment: Generalized  weakness   Lower Extremity Assessment Lower Extremity Assessment: Defer to PT evaluation       Communication Communication Communication: No apparent difficulties Factors Affecting Communication: Hearing impaired   Cognition  Arousal: Alert Behavior During Therapy: WFL for tasks assessed/performed Cognition: No apparent impairments                               Following commands: Intact       Cueing  General Comments   Cueing Techniques: Verbal cues  had loose BM   Exercises     Shoulder Instructions      Home Living                                          Prior Functioning/Environment                      OT Problem List:     OT Treatment/Interventions:        OT Goals(Current goals can be found in the care plan section)   Acute Rehab OT Goals OT Goal Formulation: With patient Time For Goal Achievement: 02/21/24 Potential to Achieve Goals: Good ADL Goals Pt Will Perform Grooming: with modified independence;sitting Pt Will Perform Lower Body Dressing: with modified independence;sit to/from stand Pt Will Transfer to Toilet: with modified independence;ambulating Pt Will Perform Toileting - Clothing Manipulation and hygiene: with modified independence;sit to/from stand   OT Frequency:  Min 2X/week    Co-evaluation              AM-PAC OT 6 Clicks Daily Activity     Outcome Measure Help from another person eating meals?: None Help from another person taking care of personal grooming?: A Little Help from another person toileting, which includes using toliet, bedpan, or urinal?: A Little Help from another person bathing (including washing, rinsing, drying)?: A Little Help from another person to put on and taking off regular upper body clothing?: A Little Help from another person to put on and taking off regular lower body clothing?: A Little 6 Click Score: 19   End of Session Equipment Utilized During Treatment: Gait belt;Rolling walker (2 wheels) Nurse Communication: Mobility status  Activity Tolerance: Patient tolerated treatment well Patient left: in bed;with call bell/phone within reach;with bed alarm set  OT Visit Diagnosis:  Unsteadiness on feet (R26.81);Muscle weakness (generalized) (M62.81)                Time: 8699-8667 OT Time Calculation (min): 32 min Charges:  OT General Charges $OT Visit: 1 Visit OT Evaluation $OT Eval Low Complexity: 1 Low OT Treatments $Self Care/Home Management : 8-22 mins  Warrick POUR OTR/L  Acute Rehab Services  [phone] office number   Warrick Berber 02/07/2024, 1:42 PM

## 2024-02-07 NOTE — Plan of Care (Signed)
   Medical records reviewed including progress, labs and imaging. Patient slowly improving. Transferred out of ICU and on soft diet with stable Cr.   Goals of care are clear for full scope treatment. Patient and family have been encouraged to reach out for needs if they arise.   PMT will continue to follow peripherally. Thank you for your referral and allowing PMT to assist in [name]'s care.   Chase Knape, PA-C Palliative Medicine Team  Team Phone # [phone]   NO CHARGE

## 2024-02-07 NOTE — Consult Note (Signed)
 Physical Medicine and Rehabilitation Consult Reason for Consult: weakness due to septic shock Referring Physician: Caleen   HPI: Chase Combs is a 78 y.o. male with prior history of coronary artery disease, STEMI in 2010, PCI and thrombectomy right coronary artery and recent LAD stent placed 01/16/2024, prostate cancer status post radiation therapy in 2022, with seed implant, TKR 10/01/2023,recent acute respiratory failure due to viral pneumonia during hospitalization in August 2025.  Her recent hospitalization for above was from 01/07/2024 till 01/19/2024.  She was then readmitted on 01/22/2024 with nausea/vomiting/diarrhea.  Imaging studies were concerning for ischemic colitis.  The patient was hypotensive and was placed on broad-spectrum antibiotics.  The patient received fluid resuscitation for hypovolemic septic shock.  The patient also had acute on chronic renal failure.  The patient was evaluated by general surgery not felt to be a surgical candidate.  The patient developed atrial fibrillation started on amiodarone  drip.  Antihypertensive medications were held.  The patient was placed on cangrelor  and then switched to Aggrastat back to Plavix  on 02/01/2024.  Rectal tube was placed for severe diarrhea.  Patient had episodes of hypokalemia requiring supplementation.  After rehydration, renal function improved.  NG tube utilized for abdominal decompression due to colonic ileus.  Patient was on clear liquids for approximately 7 days prior to TPN initiation.  Gastroenterology was contacted consulted for colonic ileus, patient prescribed metronidazole  500 p.o. twice daily as well as twice daily IV PPI.  GI bleed with hemoglobin drop from 10.4-7.5.  This was felt to be due to ischemic colitis versus stercoral ulcer.  The patient was transfused with 2 units of packed red blood cells, last unit on 02/01/2024.  At that same day the patient experienced right shoulder pain diaphoresis and EKG changes showing  anterolateral ST elevations.  He was taken emergently to the Cath Lab.  Found to have in-stent stenosis, felt to be Plavix  failure and placed back on cangrelor .  Post procedure on 02/02/2024 developed a rapid ventricular rate despite amiodarone , diltiazem  drip added Post recent echocardiogram showed echocardiogram of 20 to 25% ejection fraction, has been on IV milrinone . Had a large volume bloody stool urgent EGD and flex sig on 02/02/2024, no obvious source of bleeding was identified. Palliative care was consulted for goals of care on 01/30/2024, DNR with prearrest interventions desired PT evaluation on 02/05/2024.  The patient required mod assist to boost to stand from her recliner.  Min assist ambulate 38 feet.  The patient had heart rate in the upper 130s during ambulation. Right knee pain, was planning to have right total knee replacement Right shoulder pain was planning to have a right shoulder replacement IVs Lasix  drip Amiodarone  drip Milrinone  Home: Home Living Family/patient expects to be discharged to:: Private residence Living Arrangements: Spouse/significant other Available Help at Discharge: Family, Available 24 hours/day Type of Home: House Home Access: Ramped entrance Home Layout: One level Bathroom Shower/Tub: Engineer, manufacturing systems: Handicapped height Home Equipment: Agricultural consultant (2 wheels), Rollator (4 wheels), [employer] - single point, Lift chair, Grab bars - toilet, Tub bench, Wheelchair - manual, Art gallery manager Additional Comments: States wife with PD and recent TKA but able to care for herself. Hx of equipment a bit off during my evaluation compared to recent admission recordings. Would double check.  Functional History: Prior Function Prior Level of Function : Driving, Independent/Modified Independent Mobility Comments: Rollator use ADLs Comments: reports mod I PTA Functional Status:  Mobility: Bed Mobility General bed mobility comments: in  recliner Transfers Overall transfer level: Needs assistance Equipment used: Rolling walker (2 wheels) Transfers: Sit to/from Stand, Bed to chair/wheelchair/BSC Sit to Stand: Mod assist Bed to/from chair/wheelchair/BSC transfer type:: Step pivot Step pivot transfers: Min assist General transfer comment: Mod assist for boost and balance to stand from recliner with posterior instability. Min assist from [facility] but still looses balance backwards needing assist to correct. RW to steady, cues for anterior weight shift and placement of RW for support. Min assist for RW control to step pivot. Ambulation/Gait Ambulation/Gait assistance: Min assist, +2 safety/equipment Gait Distance (Feet): 38 Feet Assistive device: Rolling walker (2 wheels) Gait Pattern/deviations: Step-through pattern, Decreased stride length, Leaning posteriorly, Trunk flexed General Gait Details: Cues for anterior weight shift and RW placement to maximize support. Min assist for balance and RW control initially but this improved as his distance progressed. +2 for chair follow. Fatigued at 38 feet. HR to upper 130s with exertion. Gait velocity: dec Gait velocity interpretation: <1.31 ft/sec, indicative of household ambulator    ADL:    Cognition: Cognition Orientation Level: Oriented X4 Cognition Arousal: Alert Behavior During Therapy: WFL for tasks assessed/performed   Review of Systems  Constitutional:  Positive for malaise/fatigue. Negative for chills and fever.  HENT:  Negative for nosebleeds.   Eyes:  Negative for discharge and redness.  Respiratory:  Negative for cough, shortness of breath and stridor.   Cardiovascular:  Negative for chest pain and leg swelling.  Gastrointestinal:  Positive for blood in stool, diarrhea and melena. Negative for nausea and vomiting.  Genitourinary:  Negative for flank pain and hematuria.  Musculoskeletal:  Positive for joint pain.  Skin:  Negative for itching.   Past Medical  History:  Diagnosis Date   Abdominal hernia    per pt   Arthritis    hands   Benign localized prostatic hyperplasia with lower urinary tract symptoms (LUTS)    CAD (coronary artery disease) 05/2004   cardiologist--- dr anner;  positive stress test , had outpt cardiac cath 05-27-2004 stenosis RI;  06-18-2004 cath w/ PCI and BMS x1 to pRI;   STEMI 02-16-2009  s/p cath w/ PCI thrombectomy for total occluded RCA , BMS x1 to pRCA;  nuclear stress test 03-15-2012 low risk no ischemia but sig inferior-inferolateral infarct , ef 51%   Dyslipidemia    Essential hypertension    Heart murmur    History of COVID-19 05/2020   per pt mild symptoms that resolved   History of ST elevation myocardial infarction (STEMI) 02/16/2009   inferior STEMI   cath w/ pci w/ BMS to pRCA   History of urinary retention    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[facility]) 04/2021   urologist--- dr borden/ radiation oncology-- dr patrcia;  dx 12/ 2022,  Gleason 4+5, PSA 24.6, volume 94.6cc;  plan to start IMRT   OSA (obstructive sleep apnea)    per pt dx approx 2008, no cpap intolerant   PONV (postoperative nausea and vomiting)    Pre-diabetes    S/p bare metal coronary artery stent    01/ 2006  x1 BMS to pRI (CoStar study stent);  and 09/ 2010  x1 BMS to [facility]   Past Surgical History:  Procedure Laterality Date   CARDIAC CATHETERIZATION  05/27/2004   outpatient by dr burnard [facility] cardiology) for positive stress test;  80% stensis pRI   CORONARY ANGIOPLASTY WITH STENT PLACEMENT  06/18/2004   @[facility]  by Dr Lavon;  PCI w/ BMS to pRI  (CoStar study stent -  2.5 x 16)   CORONARY ANGIOPLASTY WITH STENT PLACEMENT  02/16/2009   @[facility]  by dr verlin;   Inferior STEMI:  PCI w/ thrombectomy total occluded RCA, BMS x1 to pRCA (Vision BMS 3.5 x 23 -> 4.0 mm), residual 40-50% pLAD, ef 45-50%   CORONARY PRESSURE/FFR STUDY N/A 01/17/2024   Procedure: CORONARY PRESSURE/FFR STUDY;  Surgeon: Mady Bruckner, MD;  Location: [facility] INVASIVE CV LAB;   Service: Cardiovascular;  Laterality: N/A;   CORONARY STENT INTERVENTION N/A 01/17/2024   Procedure: CORONARY STENT INTERVENTION;  Surgeon: Mady Bruckner, MD;  Location: [facility] INVASIVE CV LAB;  Service: Cardiovascular;  Laterality: N/A;   CORONARY ULTRASOUND/IVUS N/A 01/17/2024   Procedure: Coronary Ultrasound/IVUS;  Surgeon: Mady Bruckner, MD;  Location: [facility] INVASIVE CV LAB;  Service: Cardiovascular;  Laterality: N/A;   CORONARY/GRAFT ACUTE MI REVASCULARIZATION N/A 02/01/2024   Procedure: Coronary/Graft Acute MI Revascularization;  Surgeon: Elmira Newman PARAS, MD;  Location: [facility] INV[facility]IVE CV LAB;  Service: Cardiovascular;  Laterality: N/A;   ESOPHAGOGASTRODUODENOSCOPY N/A 02/02/2024   Procedure: EGD (ESOPHAGOGASTRODUODENOSCOPY);  Surgeon: Nandigam, Kavitha V, MD;  Location: [facility] ENDOSCOPY;  Service: Gastroenterology;  Laterality: N/A;   FLEXIBLE SIGMOIDOSCOPY N/A 10/20/2023   Procedure: KINGSTON SIDE;  Surgeon: Legrand Victory LITTIE DOUGLAS, MD;  Location: [facility] ENDOSCOPY;  Service: Gastroenterology;  Laterality: N/A;   FLEXIBLE SIGMOIDOSCOPY N/A 02/02/2024   Procedure: SIGMOIDOSCOPY, FLEXIBLE;  Surgeon: Shila Gustav GAILS, MD;  Location: [facility] ENDOSCOPY;  Service: Gastroenterology;  Laterality: N/A;   GOLD SEED IMPLANT N/A 09/19/2021   Procedure: GOLD SEED IMPLANT;  Surgeon: Cam Morene ORN, MD;  Location: [facility];  Service: Urology;  Laterality: N/A;  ONLY NEEDS 30 MIN FOR ALL   INGUINAL HERNIA REPAIR Right 09/02/1999   @[facility] ;   recurrent repair right inguinal hernia;   previous repair approx 1990s   INGUINAL HERNIA REPAIR Left    1990s   KNEE ARTHROSCOPY W/ MENISCAL REPAIR Right 12/18/2005   @[facility]    LUMBAR DISC SURGERY  1982   L5--S1   RIGHT/LEFT HEART CATH AND CORONARY ANGIOGRAPHY N/A 01/17/2024   Procedure: RIGHT/LEFT HEART CATH AND CORONARY ANGIOGRAPHY;  Surgeon: Mady Bruckner, MD;  Location: [facility] INVASIVE CV LAB;  Service: Cardiovascular;  Laterality: N/A;   SPACE OAR  INSTILLATION N/A 09/19/2021   Procedure: SPACE OAR INSTILLATION;  Surgeon: Cam Morene ORN, MD;  Location: [facility];  Service: Urology;  Laterality: N/A;   TOTAL KNEE ARTHROPLASTY Left 10/01/2023   Procedure: ARTHROPLASTY, KNEE, TOTAL;  Surgeon: Kay Kemps, MD;  Location: [facility] ORS;  Service: Orthopedics;  Laterality: Left;   Family History  Problem Relation Age of Onset   Lung cancer Mother    Colon cancer Neg Hx    Esophageal cancer Neg Hx    Social History:  reports that he has never smoked. He has never used smokeless tobacco. He reports that he does not drink alcohol and does not use drugs. Allergies:  Allergies  Allergen Reactions   Codeine Nausea And Vomiting   Latex Rash    Oral blisters when used at dentist   Medications Prior to Admission  Medication Sig Dispense Refill   amiodarone  (PACERONE ) 200 MG tablet Take 1 tablet (200 mg total) by mouth daily. 30 tablet 0   aspirin  EC 81 MG tablet Take 81 mg by mouth 2 (two) times daily.     CALCIUM  PO Take 1 tablet by mouth daily.     clotrimazole-betamethasone (LOTRISONE) cream Apply 1 Application topically 2 (two) times daily as needed (skin irritation).  Cyanocobalamin  (VITAMIN B-12 PO) Take 1 tablet by mouth daily.     furosemide  (LASIX ) 20 MG tablet Take 1 tablet (20 mg total) by mouth daily. 90 tablet 3   metoprolol  succinate (TOPROL -XL) 25 MG 24 hr tablet Take 1 tablet (25 mg total) by mouth daily. 30 tablet 0   Multiple Vitamins-Minerals (MULTIVITAMIN MEN 50+) TABS Take 1 tablet by mouth daily.     Probiotic Product (PROBIOTIC PO) Take 1 capsule by mouth daily.     rosuvastatin  (CRESTOR ) 20 MG tablet TAKE 1 TABLET(20 MG) BY MOUTH DAILY 30 tablet 11   sacubitril -valsartan  (ENTRESTO ) 49-51 MG Take 1 tablet by mouth 2 (two) times daily. 60 tablet 1   spironolactone  (ALDACTONE ) 25 MG tablet Take 0.5 tablets (12.5 mg total) by mouth daily. 30 tablet 1   tamsulosin  (FLOMAX ) 0.4 MG CAPS capsule Take 1  capsule (0.4 mg total) by mouth daily after supper. 30 capsule 5   ticagrelor  (BRILINTA ) 90 MG TABS tablet Take 1 tablet (90 mg total) by mouth 2 (two) times daily. 60 tablet 1   nitroGLYCERIN  (NITROSTAT ) 0.4 MG SL tablet Place 1 tablet (0.4 mg total) under the tongue every 5 (five) minutes as needed for chest pain. (Patient not taking: Reported on 01/23/2024) 25 tablet 3   ondansetron  (ZOFRAN -ODT) 4 MG disintegrating tablet Take 1 tablet (4 mg total) by mouth every 8 (eight) hours as needed for nausea or vomiting. (Patient not taking: Reported on 01/23/2024) 20 tablet 0     Blood pressure 104/72, pulse (!) 103, temperature 97.6 F (36.4 C), temperature source Axillary, resp. rate 16, height 6' (1.829 m), weight 88 kg, SpO2 100%. Physical Exam Vitals and nursing note reviewed.  Constitutional:      Appearance: He is obese.  HENT:     Head: Normocephalic and atraumatic.     Mouth/Throat:     Pharynx: Oropharynx is clear.  Eyes:     General: No scleral icterus.    Extraocular Movements: Extraocular movements intact.     Conjunctiva/sclera: Conjunctivae normal.  Cardiovascular:     Rate and Rhythm: Tachycardia present.     Heart sounds: Normal heart sounds. No murmur heard. Pulmonary:     Effort: No respiratory distress.     Breath sounds: No stridor. No wheezing or rales.  Abdominal:     General: There is distension.     Palpations: Abdomen is soft.     Tenderness: There is no abdominal tenderness. There is no guarding or rebound.  Musculoskeletal:     Cervical back: Neck supple.     Comments: Right shoulder flexion limited to 70 degrees Right shoulder abduction limited to 45 degrees Right knee with varus deformity lacks 10 degree of extension Left knee with good alignment and full extension  Skin:    General: Skin is warm and dry.  Neurological:     Mental Status: He is alert and oriented to person, place, and time.     Cranial Nerves: No dysarthria or facial asymmetry.      Sensory: Sensation is intact.     Motor: No tremor or abnormal muscle tone.     Coordination: Coordination is intact.     Gait: Gait abnormal.     Comments: Motor strength within range of motion deficits in the right upper extremity is 5/5 deltoid bicep tricep grip 4 - bilateral hip flexors knee extensors ankle dorsi flexion and plantarflexion is 4/5  Psychiatric:        Mood and Affect: Mood normal.  Behavior: Behavior normal.     Results for orders placed or performed during the hospital encounter of 01/22/24 (from the past 24 hours)  Glucose, capillary     Status: Abnormal   Collection Time: 02/06/24 11:14 AM  Result Value Ref Range   Glucose-Capillary 177 (H) 70 - 99 mg/dL  Glucose, capillary     Status: Abnormal   Collection Time: 02/06/24  3:21 PM  Result Value Ref Range   Glucose-Capillary 127 (H) 70 - 99 mg/dL  Glucose, capillary     Status: Abnormal   Collection Time: 02/06/24  7:35 PM  Result Value Ref Range   Glucose-Capillary 166 (H) 70 - 99 mg/dL  Glucose, capillary     Status: Abnormal   Collection Time: 02/06/24 10:57 PM  Result Value Ref Range   Glucose-Capillary 164 (H) 70 - 99 mg/dL  Glucose, capillary     Status: Abnormal   Collection Time: 02/07/24  4:37 AM  Result Value Ref Range   Glucose-Capillary 169 (H) 70 - 99 mg/dL  Magnesium      Status: None   Collection Time: 02/07/24  4:45 AM  Result Value Ref Range   Magnesium  2.1 1.7 - 2.4 mg/dL  Phosphorus     Status: None   Collection Time: 02/07/24  4:45 AM  Result Value Ref Range   Phosphorus 4.0 2.5 - 4.6 mg/dL  Triglycerides     Status: None   Collection Time: 02/07/24  4:45 AM  Result Value Ref Range   Triglycerides 97 <150 mg/dL  Comprehensive metabolic panel     Status: Abnormal   Collection Time: 02/07/24  4:45 AM  Result Value Ref Range   Sodium 137 135 - 145 mmol/L   Potassium 4.1 3.5 - 5.1 mmol/L   Chloride 88 (L) 98 - 111 mmol/L   CO2 33 (H) 22 - 32 mmol/L   Glucose, Bld 204 (H)  70 - 99 mg/dL   BUN 52 (H) 8 - 23 mg/dL   Creatinine, Ser 8.74 (H) 0.61 - 1.24 mg/dL   Calcium  8.7 (L) 8.9 - 10.3 mg/dL   Total Protein 5.6 (L) 6.5 - 8.1 g/dL   Albumin 2.4 (L) 3.5 - 5.0 g/dL   AST 30 15 - 41 U/L   ALT 32 0 - 44 U/L   Alkaline Phosphatase 71 38 - 126 U/L   Total Bilirubin 0.5 0.0 - 1.2 mg/dL   GFR, Estimated 60 (L) >60 mL/min   Anion gap 16 (H) 5 - 15  CBC     Status: Abnormal   Collection Time: 02/07/24  4:45 AM  Result Value Ref Range   WBC 7.7 4.0 - 10.5 K/uL   RBC 3.61 (L) 4.22 - 5.81 MIL/uL   Hemoglobin 10.1 (L) 13.0 - 17.0 g/dL   HCT 68.7 (L) 60.9 - 47.9 %   MCV 86.4 80.0 - 100.0 fL   MCH 28.0 26.0 - 34.0 pg   MCHC 32.4 30.0 - 36.0 g/dL   RDW 80.5 (H) 88.4 - 84.4 %   Platelets 536 (H) 150 - 400 K/uL   nRBC 0.0 0.0 - 0.2 %  Cooxemetry Panel (carboxy, met, total hgb, O2 sat)     Status: Abnormal   Collection Time: 02/07/24  4:45 AM  Result Value Ref Range   Total hemoglobin 10.6 (L) 12.0 - 16.0 g/dL   O2 Saturation 46.5 %   Carboxyhemoglobin 1.4 0.5 - 1.5 %   Methemoglobin <0.7 0.0 - 1.5 %  Glucose, capillary  Status: Abnormal   Collection Time: 02/07/24  7:33 AM  Result Value Ref Range   Glucose-Capillary 142 (H) 70 - 99 mg/dL   No results found.  Assessment/Plan: Diagnosis: Debility secondary to septic shock Does the need for close, 24 hr/day medical supervision in concert with the patient's rehab needs make it unreasonable for this patient to be served in a less intensive setting? Yes Co-Morbidities requiring supervision/potential complications:  - Congestive heart failure, persistent atrial fibrillation, GI bleed requiring transfusion, probable ischemic colitis, malnutrition, right shoulder contracture, right knee contracture, Due to bladder management, bowel management, safety, skin/wound care, disease management, medication administration, pain management, and patient education, does the patient require 24 hr/day rehab nursing?  Potentially Does the patient require coordinated care of a physician, rehab nurse, therapy disciplines of PT, OT to address physical and functional deficits in the context of the above medical diagnosis(es)? Yes Addressing deficits in the following areas: balance, endurance, locomotion, strength, transferring, bowel/bladder control, bathing, dressing, feeding, grooming, toileting, and psychosocial support Can the patient actively participate in an intensive therapy program of at least 3 hrs of therapy per day at least 5 days per week? No The potential for patient to make measurable gains while on inpatient rehab is currently is poor Anticipated functional outcomes upon discharge from inpatient rehab are n/a  with PT, n/a with OT, n/a with SLP. Estimated rehab length of stay to reach the above functional goals is: Not applicable Anticipated discharge destination: Home Overall Rehab/Functional Prognosis: fair  POST ACUTE RECOMMENDATIONS: This patient's condition is appropriate for continued rehabilitative care in the following setting: Continue PT and OT in the acute care setting Patient has agreed to participate in recommended program. Yes Note that insurance prior authorization may be required for reimbursement for recommended care.  Comment: Patient will need to maintain nutrition without TPN, needs to be off milrinone , furosemide , and amiodarone  drips prior to reconsideration for intensive inpatient rehabilitation   MEDICAL RECOMMENDATIONS: Would recommend Voltaren gel to the right knee 4 times daily  2.  Still needs OT evaluation  3.  See comments I have personally performed a face to face diagnostic evaluation of this patient. Additionally, I have examined the patient's medical record including any pertinent labs and radiographic images.    Thanks,  Prentice FORBES Compton, MD 02/07/2024

## 2024-02-07 NOTE — Hospital Course (Addendum)
 [name] was admitted to the hospital with the working diagnosis of ileitis/ ischemic colitis.  Prolonged hospitalization complicated with NSTEMI and heart failure.    78 year old with history of ischemic cardiomyopathy/LAD and heart failure  admitted to the hospital for nausea and vomiting.  Recent hospitalization 08/15 tp 01/19/24 for heart failure, he was diagnosed with severe proximal LAD stenosis which was stented with drug eluding stent. Discharged on dual antiplatelet therapy, goal directed medical therapy for heart failure and 20 mg furosemide  for diuresis.  At home patient had progressive nausea and vomiting, not able to maintain oral medications including antiplatelet therapy.  [name] he was hypotensive with systolic blood pressure 94/51, HR 50, RR 23 and 02 saturation 94% temp 103.5  Lungs with increased work of breathing with no wheezing, heart with S1 and S2 present and tachycardic, abdomen with diffuse tenderness to palpation in the lower quadrants more right than left, no lower extremity edema.    Na 132, K 4.5 Cl 81 bicarbonate 31, glucose 197 bun 64 cr 2,55 AST 29 ALT 31  BNP 220  High sensitive troponin 126 and 108  Lactic acid 3,4  Wbc 2.4 hgb 13.9 plt 489  Urine analysis SG 1,020, protein negative, negative leukocytes and negative hgb  C diff negative    Chest radiograph with hypoinflation, positive cardiomegaly, bilateral basal atelectasis and small bilateral pleural effusions.    EKG 120 bpm, left axis deviation, qtc 501, sinus rhythm with multiple PVC (right bundle branch morphology), with no significant ST segment or T wave changes, (noisy baseline)    CT chest abdomen and pelvis. Bilateral faint ground glass opacities,  Gas along the wall of the ascending colon and cecum, suspicious for pneumoatosis.  Fluid filled colon suggests a diarrheal state. Diffuse colonic and small bowel distention likely represent ileus.  Increased small volume  pelvic and new right sided pericolonic trace fluid, likely secondary to colonic process.  Right nephrolithiasis Suspicion for minimal calcific pancreatitis.    General surgery recommended conservative management.   Concern patient not able to absorb antiplatelet therapy, he was placed on IV cangrelor .  Patient continue to be hypotensive despite IV fluids and was placed on norepinephrine  infusion, consistent with septic shock due to abdominal source/ ischemic colitis.    During hospitalization required PRBC transfusion due to blood loss anemia.   There was concerns of in-stent thrombosis from recent PCI therefore antiplatelets were switched from Cangrelor  to Plavix  and started on milrinone  drip due to cardiogenic shock.   Developed atrial fibrillation with rapid ventricular response, placed on IV amiodarone .  Transition to Aggrastat for anticoagulation.  09/05 GI consulted, clinical picture consistent with colonic ileus, Ogilvie syndrome.  09/08 PRBC transfusion x1, transitioned to clopidogrel .  09/09 chest pain with new antero lateral ST elevations. Underwent urgent cardiac catheterization, LAD with ostially occluded stent with acute stent thrombosis (Culprit vessel). Successful percutaneous coronary intervention ostial proximal LAD, PTCA with balloon dilatation.  Significant volume overloaded with low SV02 and CVP very high, placed on IV furosemide  and milrinone .  09/10 completed antibiotic course.  09/10 EGD with esophageal ulcer with no bleeding and no stigmata of recent bleeding.  LA grade C reflux esophagitis with no bleeding.  Duodenal erosions without bleeding.  Flexible sigmoidoscopy positive blood in the rectum, in the sigmoid colon, in the descending colon and in the transverse colon. No obvious source of bleed.  Diverticulosis in the sigmoid colon and in the descending colon. No active bleeding.  A single  superficial ulcer in the rectum. No active bleeding.  09/12 transition to  ticagrelor .  Slowly weaned off TPN, diet as tolerated.   09/19 continue colonic pseudo obstruction, imaging with progression of colonic distention. Started on rifaximin .  Pending cardioversion until bowel function improves.  09/21 pending direct current cardioversion.  09/22 CT abdomen and pelvis dilated loops of small bowel and colon with air fluid levels in the distal colon and rectum. No obstruction. Appearance consistent with ileus or functional bowel disturbance.  09/23 trial of neostigmine .  02-18-2024 persistent colonic pseudo obstruction despite several days of SQ neostigmine  and 1 dose of IV neostigmine . All without improvement in his symptoms. Abd XR continue to show dilated colon.  Pt taken for decompression colonoscopy with colonic decompression tube placed 02-20-2024 significant improvement in abd distension. Pt now having multiple liquid BM.  Pt remains on IV amiodarone  and eliquis  for rapid afib. 02-21-2024 pt successfully cardioverted back to NSR. Pt loaded with 300 mg plavix  after his plavix  metabolism test showed he is a high metabolizer of plavix . 10/01 transitioned to po amiodarone , continue sinus rhythm.  10/02 removed rectal tube, possible transfer to CIR

## 2024-02-07 NOTE — Progress Notes (Addendum)
 Daily Progress Note  DOA: 01/22/2024 Hospital Day: 66  Cc: Colonic ileus  ASSESSMENT    78 year old male admitted with admitted with severe sepsis , intestinal pneumatosis / colitis and ileus.  Hospital course has been complicated by multiple issues  Persistent pseudoobstruction. TODAY: Today's KUB shows persistent but stable gaseous distention of the colon. But clinically he seems to be doing fairly okay. Having bowel movements.  No abdominal pain.  No vomiting.  He is struggling with nausea but attributes that to food choices at the hospital.   Esophagitis / esophageal ulcer ( this admission)  CAD STEMI, status post PCI to LAD Aug 2025 with subsequent thrombosis 02/01/2024 status post PTCA to LAD.   New A-fib with RVR  Acute on chronic HFrEF / ICM 01/13/24 Echo EF  20-25% RV moderately reduced    Principal Problem:   Intestinal ischemia (HCC) Active Problems:   Coronary artery disease involving native coronary artery of native heart without angina pectoris   ST elevation myocardial infarction involving left main coronary artery (HCC)   Essential hypertension   Hyperlipidemia associated with type 2 diabetes mellitus (HCC)   Sepsis (HCC)   Pneumatosis intestinalis of large intestine   Malnutrition of moderate degree   Ogilvie syndrome   Small intestinal bacterial overgrowth (SIBO)   Gastrointestinal hemorrhage   Acute blood loss anemia   Persistent atrial fibrillation (HCC)   Systolic heart failure (HCC)   Rectal bleeding   Ileus (HCC)   Atrial fibrillation with RVR (HCC)   Anemia due to acute blood loss   Pressure injury of skin   Hematochezia   PUD (peptic ulcer disease)   ABLA (acute blood loss anemia)   Hyperglycemia   Cardiogenic shock (HCC)   PLAN   --Continue twice daily IV pantoprazole  --Continue to mobilize as much as possible.  --Continue to keep electrolytes within normal range --For now we are holding off on Neostigmine  given cardiac history.  If necessary then we may consider Mestinon which still carries some cardiac risk.   Subjective   Overall feels okay, having BMs. Having some nausea but no vomiting. Tolerating some solids, says he mainly isn't eating due to food choices.   Objective   02/02/24 EGD for gastrointestinal bleeding - Esophageal ulcer with no bleeding and no stigmata of recent bleeding.  - LA Grade C reflux esophagitis with no bleeding.  - Normal stomach. - Duodenal erosions without bleeding. - No specimens collected.  02/02/24 Flexible sigmoidoscopy for hematochezia - Blood in the rectum, in the sigmoid colon, in the descending colon and in the transverse colon. No obvious source of bleed - Diverticulosis in the sigmoid colon and in the descending colon. No active bleeding  - A single superficial ulcer in the rectum. No active bleeding    Recent Labs    02/05/24 0554 02/06/24 0413 02/07/24 0445  WBC 6.5 6.5 7.7  HGB 8.5* 9.4* 10.1*  HCT 25.9* 28.8* 31.2*  MCV 104.4* 86.0 86.4  PLT 345 460* 536*   No results for input(s): FOLATE, VITAMINB12, FERRITIN, TIBC, IRONPCTSAT in the last 72 hours. Recent Labs    02/05/24 1120 02/06/24 0413 02/07/24 0445  NA 131* 136 137  K 3.8 3.6 4.1  CL 93* 91* 88*  CO2 24 31 33*  GLUCOSE 123* 153* 204*  BUN 46* 47* 52*  CREATININE 1.20 1.23 1.25*  CALCIUM  8.7* 8.7* 8.7*   Recent Labs    02/05/24 0835 02/06/24 0413 02/07/24 0445  PROT 5.3* 5.4*  5.6*  ALBUMIN 2.1* 2.3* 2.4*  AST 29 35 30  ALT 26 31 32  ALKPHOS 56 64 71  BILITOT 0.4 0.5 0.5      Imaging:  DG Abd 1 View CLINICAL DATA:  Abdominal distention  EXAM: ABDOMEN - 1 VIEW  COMPARISON:  February 04, 2024  FINDINGS: Gaseous distention of the large bowel loops, stable to prior exam suggestive of ileus/obstruction. No radio-opaque calculi or other significant radiographic abnormality are seen. No pneumoperitoneum. Multilevel degenerative changes of the  spine.  IMPRESSION: Gaseous distention of the colon, stable to prior.  Electronically Signed   By: Megan  Zare M.D.   On: 02/07/2024 11:33     Scheduled inpatient medications:   acetaZOLAMIDE   500 mg Oral BID   Chlorhexidine  Gluconate Cloth  6 each Topical Q0600   digoxin   0.0625 mg Oral Daily   feeding supplement  237 mL Oral TID BM   Gerhardt's butt cream   Topical BID   [START ON 02/08/2024] Influenza vac split trivalent PF  0.5 mL Intramuscular Tomorrow-1000   insulin  aspart  0-20 Units Subcutaneous Q4H   melatonin  5 mg Oral QHS   multivitamin with minerals  1 tablet Oral Daily   nystatin    Topical BID   pantoprazole  (PROTONIX ) IV  40 mg Intravenous Q12H   [START ON 02/08/2024] pneumococcal 20-valent conjugate vaccine  0.5 mL Intramuscular Tomorrow-1000   sodium chloride  flush  10-40 mL Intracatheter Q12H   sodium chloride  flush  3 mL Intravenous Q12H   spironolactone   25 mg Oral Daily   ticagrelor   90 mg Oral BID   traZODone   50 mg Oral QHS   Continuous inpatient infusions:   amiodarone  30 mg/hr (02/07/24 1615)   famotidine  (PEPCID ) IV 20 mg (02/07/24 0913)   furosemide      furosemide  (LASIX ) 200 mg in dextrose  5 % 100 mL (2 mg/mL) infusion 20 mg/hr (02/07/24 1350)   milrinone  0.25 mcg/kg/min (02/07/24 1615)   ondansetron  (ZOFRAN ) IV     TPN ADULT (ION) 15 mL/hr at 02/07/24 1616   PRN inpatient medications: acetaminophen , ALPRAZolam , hydrALAZINE , HYDROmorphone  (DILAUDID ) injection, ipratropium-albuterol , metoprolol  tartrate, ondansetron  (ZOFRAN ) IV, mouth rinse, polyethylene glycol, sodium chloride  flush, sodium chloride  flush  Vital signs in last 24 hours: Temp:  [97.6 F (36.4 C)-98 F (36.7 C)] 97.9 F (36.6 C) (09/15 1608) Pulse Rate:  [89-108] 89 (09/15 1608) Resp:  [16-29] 20 (09/15 1231) BP: (98-114)/(59-79) 98/69 (09/15 1608) SpO2:  [98 %-100 %] 98 % (09/15 1608) Weight:  [88 kg] 88 kg (09/15 0434) Last BM Date : 02/07/24  Intake/Output Summary (Last  24 hours) at 02/07/2024 1658 Last data filed at 02/07/2024 1613 Gross per 24 hour  Intake 1839.2 ml  Output 7200 ml  Net -5360.8 ml    Intake/Output from previous day: 09/14 0701 - 09/15 0700 In: 1765.8 [P.O.:240; I.V.:1349.4; IV Piggyback:176.4] Out: 7450 [Urine:7450] Intake/Output this shift: Total I/O In: 1012.7 [P.O.:360; I.V.:652.7] Out: 1650 [Urine:1650]   Physical Exam:  General: Alert male in NAD Heart:  Regular rate and rhythm.  Pulmonary: Normal respiratory effort Abdomen: Soft but largely distended, nontender, abnormal bowel sounds.  Neurologic: Alert and oriented Psych: Pleasant. Cooperative     LOS: 16 days   Vina Dasen ,NP 02/07/2024, 4:58 PM  --------------------------------------------------------------------------  I have taken a history, reviewed the chart and examined the patient. I performed a substantive portion of this encounter, including complete performance of at least one of the key components, in conjunction with the APP. I agree  with the APP's note, impression and recommendations  78 year old male with CAD, ischemic cardiomyopathy hospitalization in May for prolonged ileus (small and large bowel) following orthopedic surgery, readmitted with presentation consistent with ischemic colitis.  Hospitalization complicated by in-stent stenosis, a-fib with RVR, acute heart failure and persistent colonic ileus.   GI reconsulted for persistent colonic ileus.  The patient's plain films have been stable for the past 2 weeks.  His symptoms are minimal compared to his imaging.  He is eating and thinks he would eat more if the food were more palatable.  Nausea, but no vomiting.  Passing loose stools daily. His ileus symptoms had completely resolved following his hospitalization in May, but returned once again during this hospitalization.  His abdomen is quite distended and tympanic, but not tense and nontender.  Although treatment with neostigmine  should  certainly be considered, given the patient's minimal symptoms and complex cardiac issues, I'm not sure that it is absolutely necessary.  Endoscopic decompression typically reserved for more severe, progressive cases, as the risks of perforation in the setting of pseudo-obstruction are not trivial and results are typically transient.  I am hopeful that his pseudo-obstruction will again resolve with conservative therapy.  Increasing his mobility and out of bed activity should help. Continue to maintain normal electrolytes and avoid narcotics/anti-histamines If not improving over the course of the week, will reconsider neostigmine  or pyridostigmine   Moderate complex decision making ( chart review, review of results, face-to-face time used for counseling as well as treatment plan and follow-up. The patient was provided an opportunity to ask questions and all were answered. The patient agreed with the plan and demonstrated an understanding of the instructions   Garnell Begeman E. Stacia, MD [facility]

## 2024-02-07 NOTE — Progress Notes (Signed)
 OT Cancellation Note  Patient Details Name: Chase Combs MRN: [id number] DOB: July 07, 1945   Cancelled Treatment:    Reason Eval/Treat Not Completed: Other (comment) (Pt reported wanting to eat lunch. Will follow up.) Erina Hamme K OTR/L  Acute Rehab Services  [phone] office number   Warrick Berber 02/07/2024, 12:15 PM

## 2024-02-07 NOTE — Plan of Care (Signed)

## 2024-02-07 NOTE — Progress Notes (Addendum)
 Nutrition Follow-up  DOCUMENTATION CODES:   Non-severe (moderate) malnutrition in context of acute illness/injury  INTERVENTION:   Continue to wean TPN Kcal: 1900-2100 kcals Protein: 95-110g    Modify to Boost Plus po TID, each supplement provides 360 kcal and 14 grams of protein   Transition to oral MVI w/ minerals  Monitor diet advancement and tolerance   NUTRITION DIAGNOSIS:  Moderate Malnutrition related to acute illness as evidenced by energy intake < 75% for > 7 days, moderate muscle depletion, mild muscle depletion.  GOAL:  Patient will meet greater than or equal to 90% of their needs  MONITOR:  PO intake, Supplement acceptance, Diet advancement, I & O's  REASON FOR ASSESSMENT:   Consult Calorie Count, Assessment of nutrition requirement/status, Diet education  ASSESSMENT:  Pt with PMH significant for: ischemic cardiomyopathy and recent LAD w/ stent placement for NSTEMI, HTN, arthritis, prostate cancer. Presented to ED with c/o nausea and vomiting. CT c/f intestinal pneumatosis w/ ischemic versus infectious colitis. Admitted w/ septic shock.  8/31 admitted 9/03 transitioned to [facility] care; diet advanced to CLD 9/04 NGT removed 9/05 - TPN initiated  9/07 FlexiSeal removed  9/08 - 1 unit PRBC transfused 9/09 - 1 unit PRBC transfused; downgraded to clear liquid diet 9/12 - advanced to full liquid diet 9/13 - advanced to DYS3/thin liquid diet 9/14 - TPN reduced to 1/2 9/15 - TPN stopped  Met with patient and wife at bedside. Patient frustrated that he is still admitted, but seems more optimistic about discharging. Discussed slow weaning of his TPN and acknowledged the effort he put in to his calorie count.   48 hour calorie count ordered and now complete. Results are below and suggest patient consistently meeting >50% of estimated calorie and protein needs. Appropriate to start weaning TPN. Discussed this with patient and the importance of consuming adequate calories  and protein when TPN discontinued to prevent decline and loss of lean body mass as well as heal new stage 2 pressure injury to his buttocks. He does not prefer hospital food, but verbalizes understanding.  Day 1  Breakfast: No documentation received (0 kcals, 0g protein) Lunch: 100% applesauce, Ensure, Jell-O, apple juice x2 (542 kcals, 23g protein) Dinner: 50% braised beef pot roast, mashed potatoes, green beans, 100% cottage cheese place 100% grape juice x2 (503 kcals, 27g protein)   DAY 1 Total intake: 1045 kcal (55% of minimum estimated needs)  50g protein (53% of minimum estimated needs)  Day 2 Breakfast: 100% cheerios w/ chilled peaches and whole milk (100 kcals, 3g protein) Lunch: 25% spaghetti, chilled peaches, apple juice x2 (143 kcals, 6g protein) Dinner: 50% omelet, 100% cottage cheese place, 100% chocolate milk (419 kcals, 32g protein) Supplements: 100% Ensure x1 (350 kcals, 20g protein)  DAY 2 Total intake: 1012 kcal (54% of minimum estimated needs)  61 protein (64% of minimum estimated needs)  Requesting his diet be advanced to regular so food from outside facility can be brought in. Spoke with attending about this and waiting on GI to weigh in. Will continue to monitor.   Admit Weight: 88.1 kg Current Weight: 88 kg +2-3 pitting edema BLEs   Reports UBW around 205lbs and last weighed this three weeks ago. Current body weight 194lbs. Weight stable throughout admission. Per heart failure NP, patient volume overloaded with CVP 16 and 2-3+ pitting edema.  Bowels stable. UOP desirable. New stage 2 pressure injury to buttocks.     Intake/Output Summary (Last 24 hours) at 02/07/2024 1644 Last data filed  at 02/07/2024 1613 Gross per 24 hour  Intake 1839.2 ml  Output 7200 ml  Net -5360.8 ml      Drains/Lines: L basilic: PICC, triple lumen (placed 9/01) UOP: 7450 ml x24 hours  Meds:  SSI Novolog  q4 hr Melatonin  Protonix  Spironolactone    Drips: Famotidine  Lasix  Milrinone    Labs:  Na+ 131>136>137 (wdl) CBGs 153-204 x24 hours A1c 6.4 (08/2023)  Diet Order:   Diet Order             DIET DYS 3 Room service appropriate? Yes; Fluid consistency: Thin  Diet effective now            EDUCATION NEEDS:  No education needs have been identified at this time  Skin:  Skin Assessment: Reviewed RN Assessment  Last BM:  9/15 - type 6 x1  Height:  Ht Readings from Last 1 Encounters:  01/28/24 6' (1.829 m)   Weight:  Wt Readings from Last 1 Encounters:  02/07/24 88 kg   Ideal Body Weight:  80.9 kg  BMI:  Body mass index is 26.31 kg/m.  Estimated Nutritional Needs:   Kcal:  1900-2100 kcals  Protein:  95-110g  Fluid:  1.9-2.1L/day  Blair Deaner MS, RD, LDN Registered Dietitian Clinical Nutrition RD Inpatient Contact Info in Amion

## 2024-02-07 NOTE — Progress Notes (Signed)
 PROGRESS NOTE    Chase Combs  FMW:[id number] DOB: 1945-08-18 DOA: 01/22/2024 PCP: Wonda Worth SQUIBB, PA    Brief Narrative:  78 year old with history of ischemic cardiomyopathy/LAD with PCI on 8/21, CHF, A-fib admitted to the hospital for nausea vomiting and CT suggestive of ischemic colitis, pneumatosis intestinalis/ileus.  General surgery recommended conservative management.  During hospitalization required PRBC transfusion due to blood loss anemia.  There was concerns of in-stent thrombosis from recent PCI therefore antiplatelets were switched from Cangrelor  to Plavix  and started on milrinone  drip due to cardiogenic shock.  Positive inotropes and diuretics were adjusted by CHF team.  EGD and flexible sigmoidoscopy with finding of red blood in the colon but no active bleeding, scattered diverticulosis. Grade C esophagitis, 1 superficial esophageal ulcer with no bleeding and a few erosions in the duodenum.  Recommended antiplatelet therapy and outpatient follow-up for colonoscopy.   Assessment & Plan:  Recent NSTEMI with acute in-stent thrombosis, CAD Acute congestive heart failure with reduced EF, ischemic cardiomyopathy, EF 20% Cardiogenic shock -Patient has been seen by cardiology and CHF team.  Ongoing antiplatelet management along with diabetes per their service.  Atrial fibrillation RVR; slow improvement.  -Appears to be rate controlled at this time.  Continue amiodarone  drip with plans to transition to p.o.[name]  Cardioversion is not recommended due to recent GI bleed.  Intestinal pneumatosis/colonic adynamic ileus -Initial concerns of septic shock as well.  Treated with vancomycin  and thereafter transition to Zosyn  > Rocephin /Flagyl .  Completed antibiotic course 9/10. -Optimize electrolytes, ambulation and frequent repositioning -Slowly weaning off TPN, dysphagia 3 diet - General Surgery recommended conservative management per -Repeat abdominal x-ray shows persistent ileus.  Will  discuss with GI regarding decompression  Acute blood loss anemia/GI bleed -EGD and flexible sigmoidoscopy with finding of red blood in the colon but no active bleeding, scattered diverticulosis. Grade C esophagitis, 1 superficial esophageal ulcer with no bleeding and a few erosions in the duodenum.  Recommended antiplatelet therapy and outpatient follow-up for colonoscopy. -PPI twice daily -Received total of 4 units of PRBC.  Hemoglobin now stable around 10.0.  Deconditioning -PT/OT   DVT prophylaxis: SCDs    Code Status: Do not attempt resuscitation (DNR) PRE-ARREST INTERVENTIONS DESIRED Family Communication:   Status is: Inpatient Remains inpatient appropriate because: Ongoing multiple medical issues   PT Follow up Recs: Acute Inpatient Rehab (3hours/Day)02/05/2024 1400  Subjective: Patient seen at bedside.  Attempted some p.o. intake this morning thereafter had some nausea Reports of some diarrhea this morning  Examination:  General exam: Appears calm and comfortable  Respiratory system: Clear to auscultation. Respiratory effort normal. Cardiovascular system: S1 & S2 heard, RRR. No JVD, murmurs, rubs, gallops or clicks. No pedal edema. Gastrointestinal system: Diminished bowel sounds Central nervous system: Alert and oriented. No focal neurological deficits. Extremities: Symmetric 5 x 5 power. Skin: No rashes, lesions or ulcers Psychiatry: Judgement and insight appear normal. Mood & affect appropriate. PICC line in place in the left upper extremity               Diet Orders (From admission, onward)     Start     Ordered   02/05/24 0959  DIET DYS 3 Room service appropriate? Yes; Fluid consistency: Thin  Diet effective now       Question Answer Comment  Room service appropriate? Yes   Fluid consistency: Thin      02/05/24 0958            Objective: Vitals:   02/06/24  1926 02/06/24 2252 02/07/24 0434 02/07/24 0734  BP: 104/77 114/79 102/69 104/72   Pulse: (!) 102 (!) 108 100 (!) 103  Resp: (!) 29 16 18 16   Temp: 97.8 F (36.6 C) 97.8 F (36.6 C) 98 F (36.7 C) 97.6 F (36.4 C)  TempSrc: Oral Axillary Axillary Axillary  SpO2: 99% 98% 100% 100%  Weight:   88 kg   Height:        Intake/Output Summary (Last 24 hours) at 02/07/2024 1205 Last data filed at 02/07/2024 0615 Gross per 24 hour  Intake 1187.7 ml  Output 6750 ml  Net -5562.3 ml   Filed Weights   02/05/24 0600 02/06/24 0500 02/07/24 0434  Weight: 98.4 kg 91.4 kg 88 kg    Scheduled Meds:  acetaZOLAMIDE   500 mg Oral BID   Chlorhexidine  Gluconate Cloth  6 each Topical Q0600   digoxin   0.0625 mg Oral Daily   feeding supplement  237 mL Oral TID BM   Gerhardt's butt cream   Topical BID   [START ON 02/08/2024] Influenza vac split trivalent PF  0.5 mL Intramuscular Tomorrow-1000   insulin  aspart  0-20 Units Subcutaneous Q4H   melatonin  5 mg Oral QHS   nystatin    Topical BID   pantoprazole  (PROTONIX ) IV  40 mg Intravenous Q12H   [START ON 02/08/2024] pneumococcal 20-valent conjugate vaccine  0.5 mL Intramuscular Tomorrow-1000   sodium chloride  flush  10-40 mL Intracatheter Q12H   sodium chloride  flush  3 mL Intravenous Q12H   spironolactone   25 mg Oral Daily   ticagrelor   90 mg Oral BID   traZODone   50 mg Oral QHS   Continuous Infusions:  amiodarone  30 mg/hr (02/07/24 0559)   famotidine  (PEPCID ) IV 20 mg (02/07/24 0913)   furosemide      furosemide  (LASIX ) 200 mg in dextrose  5 % 100 mL (2 mg/mL) infusion 20 mg/hr (02/07/24 0439)   milrinone  0.25 mcg/kg/min (02/07/24 0436)   ondansetron  (ZOFRAN ) IV     TPN ADULT (ION) 30 mL/hr at 02/07/24 0615    Nutritional status Signs/Symptoms: energy intake < 75% for > 7 days, moderate muscle depletion, mild muscle depletion Interventions: Boost Breeze, MVI, Refer to RD note for recommendations Body mass index is 26.31 kg/m.  Data Reviewed:   CBC: Recent Labs  Lab 02/01/24 0458 02/01/24 1706 02/03/24 2342  02/04/24 0504 02/05/24 0554 02/06/24 0413 02/07/24 0445  WBC 9.8   < > 10.6* 9.6 6.5 6.5 7.7  NEUTROABS 8.1*  --   --   --   --   --   --   HGB 9.2*   < > 9.1* 9.5* 8.5* 9.4* 10.1*  HCT 27.5*   < > 27.8* 29.0* 25.9* 28.8* 31.2*  MCV 87.3   < > 88.3 87.6 104.4* 86.0 86.4  PLT 291   < > 341 335 345 460* 536*   < > = values in this interval not displayed.   Basic Metabolic Panel: Recent Labs  Lab 02/03/24 0655 02/03/24 0816 02/04/24 0504 02/04/24 1724 02/05/24 0835 02/05/24 1120 02/06/24 0413 02/07/24 0445  NA  --    < > 132*  132* 131* 129* 131* 136 137  K  --    < > 4.1  4.1 3.7 3.7 3.8 3.6 4.1  CL  --    < > 97*  96* 96* 92* 93* 91* 88*  CO2  --    < > 23  24 25 26 24 31  33*  GLUCOSE  --    < >  202*  181* 104* 322* 123* 153* 204*  BUN  --    < > 41*  42* 43* 43* 46* 47* 52*  CREATININE  --    < > 1.20  1.20 1.17 1.20 1.20 1.23 1.25*  CALCIUM   --    < > 8.0*  8.1* 8.3* 8.2* 8.7* 8.7* 8.7*  MG 2.9*  --  2.1  --   --  2.0 1.9 2.1  PHOS 8.5*  --  5.8*  --  3.8 4.0 3.8 4.0   < > = values in this interval not displayed.   GFR: Estimated Creatinine Clearance: 55.2 mL/min (A) (by C-G formula based on SCr of 1.25 mg/dL (H)). Liver Function Tests: Recent Labs  Lab 02/03/24 0816 02/04/24 0504 02/05/24 0835 02/06/24 0413 02/07/24 0445  AST 52* 35 29 35 30  ALT 24 25 26 31  32  ALKPHOS 58 54 56 64 71  BILITOT 0.5 0.2 0.4 0.5 0.5  PROT 4.8* 5.0* 5.3* 5.4* 5.6*  ALBUMIN 1.9* 2.0*  2.0* 2.1* 2.3* 2.4*   No results for input(s): LIPASE, AMYLASE in the last 168 hours. No results for input(s): AMMONIA in the last 168 hours. Coagulation Profile: No results for input(s): INR, PROTIME in the last 168 hours. Cardiac Enzymes: No results for input(s): CKTOTAL, CKMB, CKMBINDEX, TROPONINI in the last 168 hours. BNP (last 3 results) Recent Labs    01/07/24 2301  PROBNP 15,245.0*   HbA1C: No results for input(s): HGBA1C in the last 72  hours. CBG: Recent Labs  Lab 02/06/24 1521 02/06/24 1935 02/06/24 2257 02/07/24 0437 02/07/24 0733  GLUCAP 127* 166* 164* 169* 142*   Lipid Profile: Recent Labs    02/07/24 0445  TRIG 97   Thyroid  Function Tests: No results for input(s): TSH, T4TOTAL, FREET4, T3FREE, THYROIDAB in the last 72 hours. Anemia Panel: No results for input(s): VITAMINB12, FOLATE, FERRITIN, TIBC, IRON, RETICCTPCT in the last 72 hours. Sepsis Labs: Recent Labs  Lab 02/01/24 1401 02/01/24 1514 02/02/24 0928  LATICACIDVEN 0.7 0.6 0.8    No results found for this or any previous visit (from the past 240 hours).       Radiology Studies: DG Abd 1 View Result Date: 02/07/2024 CLINICAL DATA:  Abdominal distention EXAM: ABDOMEN - 1 VIEW COMPARISON:  February 04, 2024 FINDINGS: Gaseous distention of the large bowel loops, stable to prior exam suggestive of ileus/obstruction. No radio-opaque calculi or other significant radiographic abnormality are seen. No pneumoperitoneum. Multilevel degenerative changes of the spine. IMPRESSION: Gaseous distention of the colon, stable to prior. Electronically Signed   By: Megan  Zare M.D.   On: 02/07/2024 11:33           LOS: 16 days   Time spent= 35 mins    Chase JAYSON Dare, MD [facility]  If 7PM-7AM, please contact night-coverage  02/07/2024, 12:05 PM

## 2024-02-07 NOTE — Progress Notes (Signed)
 Physical Therapy Treatment Patient Details Name: Chase Combs MRN: [id number] DOB: [birth year] Today's Date: 02/07/2024   History of Present Illness 78 year old man admitted 8/30 with progressive nausea vomiting with CT images concerning for ischemic colitis; pneumatosis intestinalis/ileus. 9/9  recurrent STEMI with in-stent stenosis.[name]  PMH: ischemic cardiomyopathy and very recent LAD stent placement for NSTEMI discharged 8/27.    PT Comments  Pt admitted with above diagnosis. Pt able to incr distance today with ambulation and needed less assist. Fatigues quickly and did not want to perform any other activities after the walk.  Pt reports he recently got back into bed after sitting up and was tired but was agreeable to walk with RW.  Needs continued PT to progress and maximize strength and endurance. Pt is a good candidate for post acute rehab > 3 hours day.   Pt currently with functional limitations due to the deficits listed below (see PT Problem List). Pt will benefit from acute skilled PT to increase their independence and safety with mobility to allow discharge.       If plan is discharge home, recommend the following: A lot of help with walking and/or transfers;A lot of help with bathing/dressing/bathroom;Assistance with cooking/housework;Assist for transportation   Can travel by private vehicle        Equipment Recommendations  None recommended by PT    Recommendations for Other Services Rehab consult     Precautions / Restrictions Precautions Precautions: Fall Recall of Precautions/Restrictions: Intact Restrictions Weight Bearing Restrictions Per Provider Order: No Other Position/Activity Restrictions: monitor O2     Mobility  Bed Mobility Overal bed mobility: Needs Assistance Bed Mobility: Supine to Sit, Sit to Supine     Supine to sit: Supervision Sit to supine: Supervision   General bed mobility comments: No assist getting to EOB or back into bed.     Transfers Overall transfer level: Needs assistance Equipment used: Rolling walker (2 wheels) Transfers: Sit to/from Stand, Bed to chair/wheelchair/BSC Sit to Stand: Min assist, +2 safety/equipment, From elevated surface           General transfer comment: Min assist for boost and balance to stand from elevated bed with cues for hand placement. Needs RW to steady, cues for placement of RW for support and to stay close to rW.    Ambulation/Gait Ambulation/Gait assistance: Min assist, +2 safety/equipment Gait Distance (Feet): 70 Feet Assistive device: Rolling walker (2 wheels) Gait Pattern/deviations: Step-through pattern, Decreased stride length, Trunk flexed, Drifts right/left Gait velocity: dec Gait velocity interpretation: <1.31 ft/sec, indicative of household ambulator   General Gait Details: min Cues for RW placement to maximize support. min assist for balance and RW control initially but this improved to CGA as his distance progressed. +2 for chair follow for pt didnt need to sit and was able to walk to hallway and back to bed 70 feet total. Fatigued at 70. HR to upper 115 with exertion.   Stairs             Wheelchair Mobility     Tilt Bed    Modified Rankin (Stroke Patients Only)       Balance Overall balance assessment: Needs assistance Sitting-balance support: No upper extremity supported, Feet supported Sitting balance-Leahy Scale: Fair     Standing balance support: Bilateral upper extremity supported, During functional activity, Reliant on assistive device for balance Standing balance-Leahy Scale: Poor Standing balance comment: relies on UE support for balance bilaterally and use of RW.  Communication Communication Communication: No apparent difficulties Factors Affecting Communication: Hearing impaired  Cognition Arousal: Alert Behavior During Therapy: WFL for tasks assessed/performed   PT - Cognitive  impairments: No family/caregiver present to determine baseline, Awareness, Problem solving                         Following commands: Intact      Cueing Cueing Techniques: Verbal cues  Exercises General Exercises - Lower Extremity Ankle Circles/Pumps: AROM, Both, 10 reps, Seated Long Arc Quad: AROM, Both, 10 reps, Seated    General Comments        Pertinent Vitals/Pain Pain Assessment Pain Assessment: Faces Faces Pain Scale: Hurts a little bit Pain Location: buttocks Pain Descriptors / Indicators: Burning, Discomfort Pain Intervention(s): Limited activity within patient's tolerance, Monitored during session, Repositioned    Home Living                          Prior Function            PT Goals (current goals can now be found in the care plan section) Acute Rehab PT Goals Patient Stated Goal: get well, return home Progress towards PT goals: Progressing toward goals    Frequency    Min 3X/week      PT Plan      Co-evaluation              AM-PAC PT 6 Clicks Mobility   Outcome Measure  Help needed turning from your back to your side while in a flat bed without using bedrails?: A Little Help needed moving from lying on your back to sitting on the side of a flat bed without using bedrails?: A Little Help needed moving to and from a bed to a chair (including a wheelchair)?: A Little Help needed standing up from a chair using your arms (e.g., wheelchair or bedside chair)?: A Little Help needed to walk in hospital room?: Total Help needed climbing 3-5 steps with a railing? : Total 6 Click Score: 14    End of Session Equipment Utilized During Treatment: Gait belt Activity Tolerance: Patient tolerated treatment well;Patient limited by fatigue Patient left: with call bell/phone within reach;in bed;with bed alarm set Nurse Communication: Mobility status PT Visit Diagnosis: Unsteadiness on feet (R26.81);Other abnormalities of gait and  mobility (R26.89);Muscle weakness (generalized) (M62.81);Difficulty in walking, not elsewhere classified (R26.2)     Time: 9061-9043 PT Time Calculation (min) (ACUTE ONLY): 18 min  Charges:    $Gait Training: 8-22 mins PT General Charges $$ ACUTE PT VISIT: 1 Visit                     Cera Rorke M,PT Acute Rehab Services [phone]    [name] 02/07/2024, 10:39 AM

## 2024-02-08 ENCOUNTER — Inpatient Hospital Stay (HOSPITAL_COMMUNITY)

## 2024-02-08 DIAGNOSIS — I2510 Atherosclerotic heart disease of native coronary artery without angina pectoris: Secondary | ICD-10-CM | POA: Diagnosis not present

## 2024-02-08 DIAGNOSIS — I4891 Unspecified atrial fibrillation: Secondary | ICD-10-CM | POA: Diagnosis not present

## 2024-02-08 DIAGNOSIS — K5989 Other specified functional intestinal disorders: Secondary | ICD-10-CM

## 2024-02-08 DIAGNOSIS — K5981 Ogilvie syndrome: Secondary | ICD-10-CM | POA: Diagnosis not present

## 2024-02-08 DIAGNOSIS — I5023 Acute on chronic systolic (congestive) heart failure: Secondary | ICD-10-CM | POA: Diagnosis not present

## 2024-02-08 DIAGNOSIS — K559 Vascular disorder of intestine, unspecified: Secondary | ICD-10-CM | POA: Diagnosis not present

## 2024-02-08 DIAGNOSIS — K922 Gastrointestinal hemorrhage, unspecified: Secondary | ICD-10-CM | POA: Diagnosis not present

## 2024-02-08 LAB — GLUCOSE, CAPILLARY
Glucose-Capillary: 116 mg/dL — ABNORMAL HIGH (ref 70–99)
Glucose-Capillary: 189 mg/dL — ABNORMAL HIGH (ref 70–99)
Glucose-Capillary: 118 mg/dL — ABNORMAL HIGH (ref 70–99)
Glucose-Capillary: 208 mg/dL — ABNORMAL HIGH (ref 70–99)
Glucose-Capillary: 84 mg/dL (ref 70–99)

## 2024-02-08 LAB — CBC
HCT: 31.8 % — ABNORMAL LOW (ref 39.0–52.0)
Hemoglobin: 10.2 g/dL — ABNORMAL LOW (ref 13.0–17.0)
MCH: 27.9 pg (ref 26.0–34.0)
MCHC: 32.1 g/dL (ref 30.0–36.0)
MCV: 87.1 fL (ref 80.0–100.0)
Platelets: 530 K/uL — ABNORMAL HIGH (ref 150–400)
RBC: 3.65 MIL/uL — ABNORMAL LOW (ref 4.22–5.81)
RDW: 19.1 % — ABNORMAL HIGH (ref 11.5–15.5)
WBC: 5.7 K/uL (ref 4.0–10.5)
nRBC: 0.3 % — ABNORMAL HIGH (ref 0.0–0.2)

## 2024-02-08 LAB — MAGNESIUM: Magnesium: 2.2 mg/dL (ref 1.7–2.4)

## 2024-02-08 LAB — COMPREHENSIVE METABOLIC PANEL WITH GFR
ALT: 29 U/L (ref 0–44)
AST: 26 U/L (ref 15–41)
Albumin: 2.5 g/dL — ABNORMAL LOW (ref 3.5–5.0)
Alkaline Phosphatase: 76 U/L (ref 38–126)
Anion gap: 14 (ref 5–15)
BUN: 48 mg/dL — ABNORMAL HIGH (ref 8–23)
CO2: 35 mmol/L — ABNORMAL HIGH (ref 22–32)
Calcium: 8.3 mg/dL — ABNORMAL LOW (ref 8.9–10.3)
Chloride: 85 mmol/L — ABNORMAL LOW (ref 98–111)
Creatinine, Ser: 1.51 mg/dL — ABNORMAL HIGH (ref 0.61–1.24)
GFR, Estimated: 48 mL/min — ABNORMAL LOW (ref >60)
Glucose, Bld: 175 mg/dL — ABNORMAL HIGH (ref 70–99)
Potassium: 3.4 mmol/L — ABNORMAL LOW (ref 3.5–5.1)
Sodium: 134 mmol/L — ABNORMAL LOW (ref 135–145)
Total Bilirubin: 0.6 mg/dL (ref 0.0–1.2)
Total Protein: 5.7 g/dL — ABNORMAL LOW (ref 6.5–8.1)

## 2024-02-08 LAB — COOXEMETRY PANEL
Carboxyhemoglobin: 1.5 % (ref 0.5–1.5)
Carboxyhemoglobin: 0.8 % (ref 0.5–1.5)
Methemoglobin: <0.7 % (ref 0.0–1.5)
Methemoglobin: <0.7 % (ref 0.0–1.5)
O2 Saturation: 51.4 %
O2 Saturation: 53.7 %
Total hemoglobin: 10 g/dL — ABNORMAL LOW (ref 12.0–16.0)
Total hemoglobin: 10.3 g/dL — ABNORMAL LOW (ref 12.0–16.0)

## 2024-02-08 LAB — BASIC METABOLIC PANEL WITH GFR
Anion gap: 16 — ABNORMAL HIGH (ref 5–15)
BUN: 48 mg/dL — ABNORMAL HIGH (ref 8–23)
CO2: 32 mmol/L (ref 22–32)
Calcium: 8 mg/dL — ABNORMAL LOW (ref 8.9–10.3)
Chloride: 86 mmol/L — ABNORMAL LOW (ref 98–111)
Creatinine, Ser: 1.62 mg/dL — ABNORMAL HIGH (ref 0.61–1.24)
GFR, Estimated: 44 mL/min — ABNORMAL LOW (ref >60)
Glucose, Bld: 225 mg/dL — ABNORMAL HIGH (ref 70–99)
Potassium: 4 mmol/L (ref 3.5–5.1)
Sodium: 134 mmol/L — ABNORMAL LOW (ref 135–145)

## 2024-02-08 LAB — PHOSPHORUS: Phosphorus: 6.2 mg/dL — ABNORMAL HIGH (ref 2.5–4.6)

## 2024-02-08 MED ORDER — FAMOTIDINE 20 MG PO TABS
20.0000 mg | ORAL_TABLET | Freq: Two times a day (BID) | ORAL | Status: DC
  Administered 2024-02-08: 20 mg via ORAL
  Filled 2024-02-08: qty 1

## 2024-02-08 MED ORDER — ALTEPLASE 2 MG IJ SOLR
2.0000 mg | Freq: Once | INTRAMUSCULAR | Status: DC
  Filled 2024-02-08: qty 2

## 2024-02-08 MED ORDER — POTASSIUM CHLORIDE CRYS ER 20 MEQ PO TBCR
40.0000 meq | EXTENDED_RELEASE_TABLET | Freq: Once | ORAL | Status: AC
Start: 2024-02-08 — End: 2024-02-08
  Administered 2024-02-08: 40 meq via ORAL
  Filled 2024-02-08: qty 2

## 2024-02-08 MED ORDER — ALTEPLASE 2 MG IJ SOLR
2.0000 mg | Freq: Once | INTRAMUSCULAR | Status: AC
Start: 2024-02-08 — End: 2024-02-08
  Administered 2024-02-08: 2 mg
  Filled 2024-02-08: qty 2

## 2024-02-08 MED ORDER — METOCLOPRAMIDE HCL 5 MG/ML IJ SOLN
5.0000 mg | Freq: Three times a day (TID) | INTRAMUSCULAR | Status: DC
  Administered 2024-02-08 – 2024-02-10 (×6): 5 mg via INTRAVENOUS
  Filled 2024-02-08 (×6): qty 2

## 2024-02-08 NOTE — Progress Notes (Addendum)
 Advanced Heart Failure Rounding Note  Cardiologist: Chase Clay, MD  Chief Complaint: Heart Fialure Subjective:   9/9 PTCA LAD. A fib RVR. Placed on Amio drip. CO-OX 44 %. Started on milrinone  0.25 mcg.  9/10:  EGD and flex sig 9/10.  EGD with 1 superficial esophageal ulcer, LA grade C reflux esophagitis with no bleeding. Flex sig: Red blood in the rectum, sigmoid colon, descending colon and transverse colon. Few diverticula found. No obvious source of bleeding.   Brisk diuresis noted on lasix  drip + milrinone  0.25 mcg. CO-OX 51%.   Remains on amio drip.    Feeling better today. Denies abd pain.    Objective:   Weight Range: 85.7 kg Body mass index is 25.63 kg/m.   Vital Signs:   Temp:  [97.6 F (36.4 C)-98.5 F (36.9 C)] 98 F (36.7 C) (09/16 0745) Pulse Rate:  [85-100] 85 (09/16 0745) Resp:  [18-20] 19 (09/16 0745) BP: (90-101)/(58-69) 90/58 (09/16 0745) SpO2:  [92 %-98 %] 96 % (09/16 0745) Weight:  [85.7 kg] 85.7 kg (09/16 0640) Last BM Date : 02/07/24  Weight change: Filed Weights   02/06/24 0500 02/07/24 0434 02/08/24 0640  Weight: 91.4 kg 88 kg 85.7 kg    Intake/Output:   Intake/Output Summary (Last 24 hours) at 02/08/2024 0955 Last data filed at 02/08/2024 0700 Gross per 24 hour  Intake 2047.3 ml  Output 3850 ml  Net -1802.7 ml    CVP CVP 6-7  Physical Exam   General:   No resp difficulty Neck: JVD 10-11  Cor: Irregular rate & rhythm. Lungs: clear Abdomen: soft, nontender, nondistended.  Extremities: no  edema  PICC line Neuro: alert & oriented x3   Telemetry   A fib 90-100s   Labs    CBC Recent Labs    02/07/24 0445 02/08/24 0832  WBC 7.7 5.7  HGB 10.1* 10.2*  HCT 31.2* 31.8*  MCV 86.4 87.1  PLT 536* 530*   Basic Metabolic Panel Recent Labs    90/84/74 0445 02/08/24 0832  NA 137 134*  K 4.1 3.4*  CL 88* 85*  CO2 33* 35*  GLUCOSE 204* 175*  BUN 52* 48*  CREATININE 1.25* 1.51*  CALCIUM  8.7* 8.3*  MG 2.1 2.2  PHOS  4.0 6.2*   Liver Function Tests Recent Labs    02/07/24 0445 02/08/24 0832  AST 30 26  ALT 32 29  ALKPHOS 71 76  BILITOT 0.5 0.6  PROT 5.6* 5.7*  ALBUMIN 2.4* 2.5*    Medications:   Scheduled Medications:  acetaZOLAMIDE   500 mg Oral BID   Chlorhexidine  Gluconate Cloth  6 each Topical Q0600   digoxin   0.0625 mg Oral Daily   feeding supplement  237 mL Oral TID BM   Gerhardt's butt cream   Topical BID   Influenza vac split trivalent PF  0.5 mL Intramuscular Tomorrow-1000   insulin  aspart  0-20 Units Subcutaneous Q4H   melatonin  5 mg Oral QHS   multivitamin with minerals  1 tablet Oral Daily   nystatin    Topical BID   pantoprazole  (PROTONIX ) IV  40 mg Intravenous Q12H   pneumococcal 20-valent conjugate vaccine  0.5 mL Intramuscular Tomorrow-1000   sodium chloride  flush  10-40 mL Intracatheter Q12H   sodium chloride  flush  3 mL Intravenous Q12H   spironolactone   25 mg Oral Daily   ticagrelor   90 mg Oral BID   traZODone   50 mg Oral QHS    Infusions:  amiodarone  30 mg/hr (02/08/24  9568)   famotidine  (PEPCID ) IV 20 mg (02/08/24 0827)   furosemide      furosemide  (LASIX ) 200 mg in dextrose  5 % 100 mL (2 mg/mL) infusion 20 mg/hr (02/08/24 0346)   milrinone  0.25 mcg/kg/min (02/08/24 0430)   ondansetron  (ZOFRAN ) IV      PRN Medications: acetaminophen , ALPRAZolam , hydrALAZINE , HYDROmorphone  (DILAUDID ) injection, ipratropium-albuterol , metoprolol  tartrate, ondansetron  (ZOFRAN ) IV, mouth rinse, polyethylene glycol, sodium chloride  flush, sodium chloride  flush  Patient Profile  Chase Combs is a 78 year old with a history of ICM, and chronic biventricular HFrEF.      Readmitted 01/22/24 with abdominal pain/nausea/vomiting. Hospital complicated by ischemic colitis and septic shock/GI bleed.  Assessment/Plan  CAD, Instent Stenosis 01/17/24- PCI/Des LAD  9/9 Cangrelor  stopped given Plavix  load. Later developed acute chest pain. Returned urgently to cath lab. PTCA LAD. - Plavix  Genetic  Testing collected, pending - Continue high intensity statin - Continue ticagrelor  90mg  BID - Further OAC and plavix  plan pending GI recovery, genotyping - Off aspirin  with GI bleed. May need to transition to plavix  in the future pending genetic testing and OAC plan   New A fib RVR - Rate 80-100 - Continue amio drip at 30 while on amiodarone , can hopefully transition to PO soon once off milrinone  - No DCCV with recent bleed - Continue SCDs.  - Plan for eventual heparin  drip, hopefully next week - Continue digoxin  0.0625   A/C HFrEF, ICM   01/13/24 Echo EF  20-25% RV moderately reduced.  ECHO LVEF 20-25% RV moderately reduced.    - CVP 5-6 with ongoing lower extremity edema. Continue lasix  drip and give a couple doses of diamox  today.  - CO-OX 51% . Repeat now.  - Off SGLT2i with fungal rash.  - Continue spiro 25 mg daily - Continue digoxin  0.0625 mcg daily. Check dig level in am.  -Worsening renal function. May need to back off lasix . Follow renal function.    Anemia in the setting of GI Bleed -Possible bleeding from rectal tube. Off aspirin   -Transfused 9/8 with appropriate rise.  - 9 /10 he had large bloody BM. Unfortunately given 600 mg Ibuprofen .  - Hgb stable. No further bleeding.  - Continue IV Protonix . Continue pepcid  and carafate .  - abd CT with no GIB. Possible infectious/inflammatory colitis - GI following.  - S/p EGD and flex sig.  EGD with 1 superficial esophageal ulcer, LA grade C reflux esophagitis with no bleeding. Flex sig: Red blood in the rectum, sigmoid colon, descending colon and transverse colon. Few diverticula found. No obvious source of bleeding. Plan for repeat colonoscopy if bleeding persists.    Septic Shock  On admit, ischemic colitis. Treated with antibiotics. Improved   DNR    Check BMET this afternoon.   Length of Stay: 17  Chase Mosses, NP  02/08/2024, 9:55 AM  Advanced Heart Failure Team Pager [phone] (M-F; 7a - 5p)  Please contact [facility] for night-coverage after hours (5p -7a ) and weekends on [email or web address]

## 2024-02-08 NOTE — Progress Notes (Signed)
 Physical Therapy Treatment Patient Details Name: Chase Combs MRN: [id number] DOB: [birth year] Today's Date: 02/08/2024   History of Present Illness 78 year old man admitted 8/30 with progressive nausea vomiting with CT images concerning for ischemic colitis; pneumatosis intestinalis/ileus. 9/9  recurrent STEMI with in-stent stenosis.[name]  PMH: ischemic cardiomyopathy and very recent LAD stent placement for NSTEMI discharged 8/27.    PT Comments  Pt received in supine, c/o fatigue after getting up to [facility] frequently but agreeable to therapy session with encouragement. Pt needing up to light minA for sit<>stand stability and CGA for gait with close chair follow for safety and x1 episode of minA due to slight R imbalance with pt c/o R chronic shoulder pain/weakness contributing to his R lean. Pt BP soft but only mild dizziness/lightheadedness and MAP improved in sitting compared with earlier BP readings taken in bed. Patient will benefit from intensive inpatient follow-up therapy, >3 hours/day, pt motivated to return home once able but currently requiring increased supervision/assist compared with baseline.    If plan is discharge home, recommend the following: A lot of help with walking and/or transfers;A lot of help with bathing/dressing/bathroom;Assistance with cooking/housework;Assist for transportation   Can travel by private vehicle        Equipment Recommendations  None recommended by PT    Recommendations for Other Services       Precautions / Restrictions Precautions Precautions: Fall Recall of Precautions/Restrictions: Intact Precaution/Restrictions Comments: watch BP, runs soft Restrictions Weight Bearing Restrictions Per Provider Order: No     Mobility  Bed Mobility Overal bed mobility: Needs Assistance Bed Mobility: Supine to Sit     Supine to sit: Supervision, HOB elevated, Used rails     General bed mobility comments: Increased time/effort to perform; pt up on BSC at  end of session due to need for BM, RN/NT notified.    Transfers Overall transfer level: Needs assistance Equipment used: Rolling walker (2 wheels) Transfers: Sit to/from Stand Sit to Stand: Min assist, Contact guard assist           General transfer comment: from EOB>RW with light minA for steadying and RW>BSC with CGA and cues for UE placement    Ambulation/Gait Ambulation/Gait assistance: Contact guard assist, +2 safety/equipment, Min assist Gait Distance (Feet): 65 Feet Assistive device: Rolling walker (2 wheels) Gait Pattern/deviations: Step-through pattern, Decreased stride length, Trunk flexed, Drifts right/left Gait velocity: dec     General Gait Details: Cues for energy conservation, RW proximity, heavy BUE reliance on RW. Slight R lean in stance as pt reporting chronic R shoulder pain with x1 episode minA to prevent R imbalance. +2 for chair follow for safety; pt didnt need to sit and was able to walk to hallway and back to bed 65 feet total. HR 100-110 bpm with exertion; SpO2 100% on RA sitting EOB but noisy signal with exertion due to pt using hands, no significant dypsnea.   Stairs             Wheelchair Mobility     Tilt Bed    Modified Rankin (Stroke Patients Only)       Balance Overall balance assessment: Needs assistance Sitting-balance support: Feet supported Sitting balance-Leahy Scale: Good     Standing balance support: Bilateral upper extremity supported, During functional activity, Reliant on assistive device for balance Standing balance-Leahy Scale: Poor Standing balance comment: heavy reliance on RW, R imbalance  Communication Communication Communication: No apparent difficulties Factors Affecting Communication: Hearing impaired  Cognition Arousal: Alert Behavior During Therapy: WFL for tasks assessed/performed   PT - Cognitive impairments: No apparent impairments                        PT - Cognition Comments: Cooperative but needs encouragement to start Following commands: Intact      Cueing Cueing Techniques: Verbal cues  Exercises      General Comments General comments (skin integrity, edema, etc.): see gait comments; BP 92/63 (72) HR 93 bpm sitting EOB, SpO2 100% on RA; pt denies dizziness sitting or standing, has TED hose donned; BP MAP has been <70 past two readings taken.      Pertinent Vitals/Pain Pain Assessment Pain Assessment: Faces Faces Pain Scale: Hurts a little bit Pain Location: generalized Pain Descriptors / Indicators: Discomfort, Grimacing Pain Intervention(s): Limited activity within patient's tolerance, Monitored during session, Repositioned    Home Living                          Prior Function            PT Goals (current goals can now be found in the care plan section) Acute Rehab PT Goals Patient Stated Goal: get well, return home, maybe after rehab PT Goal Formulation: With patient Time For Goal Achievement: 02/19/24 Progress towards PT goals: Progressing toward goals    Frequency    Min 3X/week      PT Plan      Co-evaluation              AM-PAC PT 6 Clicks Mobility   Outcome Measure  Help needed turning from your back to your side while in a flat bed without using bedrails?: A Little Help needed moving from lying on your back to sitting on the side of a flat bed without using bedrails?: A Little Help needed moving to and from a bed to a chair (including a wheelchair)?: A Little Help needed standing up from a chair using your arms (e.g., wheelchair or bedside chair)?: A Little Help needed to walk in hospital room?: A Lot (+2 for safety) Help needed climbing 3-5 steps with a railing? : Total 6 Click Score: 15    End of Session Equipment Utilized During Treatment:  (close chair follow as pt defers gait belt) Activity Tolerance: Patient tolerated treatment well;Patient limited by fatigue Patient  left: with call bell/phone within reach;with family/visitor present;Other (comment) (pt up on [facility] for BM, requests a few more mins, spouse in room, both agreeable to press call bell when he is done) Nurse Communication: Mobility status;Other (comment) (pt up on [facility]) PT Visit Diagnosis: Unsteadiness on feet (R26.81);Other abnormalities of gait and mobility (R26.89);Muscle weakness (generalized) (M62.81);Difficulty in walking, not elsewhere classified (R26.2)     Time: 8447-8390 PT Time Calculation (min) (ACUTE ONLY): 17 min  Charges:    $Gait Training: 8-22 mins PT General Charges $$ ACUTE PT VISIT: 1 Visit                     Pinkie Manger P., PTA Acute Rehabilitation Services Secure Chat Preferred 9a-5:30pm Office: [phone]    [name] [facility] 02/08/2024, 4:30 PM

## 2024-02-08 NOTE — Progress Notes (Signed)
 Chase Combs GASTROENTEROLOGY ROUNDING NOTE   Subjective: Patient feeling about the same as yesterday.  He ate all of his breakfast this morning.  He had a loose bowel movement this morning.  Denies nausea, vomiting or abdominal pain. Diuresing well  Objective: Vital signs in last 24 hours: Temp:  [97.6 F (36.4 C)-98.5 F (36.9 C)] 98 F (36.7 C) (09/16 1047) Pulse Rate:  [72-100] 72 (09/16 1047) Resp:  [15-20] 15 (09/16 1047) BP: (90-101)/(55-69) 97/55 (09/16 1047) SpO2:  [92 %-98 %] 95 % (09/16 1047) Weight:  [85.7 kg] 85.7 kg (09/16 0640) Last BM Date : 02/07/24 General: NAD, pleasant Caucasian elderly male Lungs:  CTA b/l, no w/r/r Heart: Irregular rate, no m/r/g Abdomen:  Soft, distended, tympanic, tinkling bowel sounds Ext:  No c/c/e    Intake/Output from previous day: 09/15 0701 - 09/16 0700 In: 2287.3 [P.O.:1080; I.V.:1057.3; IV Piggyback:150] Out: 3850 [Urine:3850] Intake/Output this shift: No intake/output data recorded.   Lab Results: Recent Labs    02/06/24 0413 02/07/24 0445 02/08/24 0832  WBC 6.5 7.7 5.7  HGB 9.4* 10.1* 10.2*  PLT 460* 536* 530*  MCV 86.0 86.4 87.1   BMET Recent Labs    02/06/24 0413 02/07/24 0445 02/08/24 0832  NA 136 137 134*  K 3.6 4.1 3.4*  CL 91* 88* 85*  CO2 31 33* 35*  GLUCOSE 153* 204* 175*  BUN 47* 52* 48*  CREATININE 1.23 1.25* 1.51*  CALCIUM  8.7* 8.7* 8.3*   LFT Recent Labs    02/06/24 0413 02/07/24 0445 02/08/24 0832  PROT 5.4* 5.6* 5.7*  ALBUMIN 2.3* 2.4* 2.5*  AST 35 30 26  ALT 31 32 29  ALKPHOS 64 71 76  BILITOT 0.5 0.5 0.6   PT/INR No results for input(s): INR in the last 72 hours.    Imaging/Other results: DG Abd 1 View Result Date: 02/07/2024 CLINICAL DATA:  Abdominal distention EXAM: ABDOMEN - 1 VIEW COMPARISON:  February 04, 2024 FINDINGS: Gaseous distention of the large bowel loops, stable to prior exam suggestive of ileus/obstruction. No radio-opaque calculi or other significant  radiographic abnormality are seen. No pneumoperitoneum. Multilevel degenerative changes of the spine. IMPRESSION: Gaseous distention of the colon, stable to prior. Electronically Signed   By: Megan  Zare M.D.   On: 02/07/2024 11:33      Assessment and Plan:  78 year old male with complicated recent medical history to include coronary artery disease with stent placement and subsequent in-stent stenosis, acute systolic heart failure with hypervolemia, A-fib with RVR, admitted with clinical presentation suggestive of ischemic colitis/intestinal pneumatosis, with ongoing issues of colonic pseudoobstruction.  He also experienced GI bleed earlier in this admission prompting cessation of antiplatelet agents.  Colonic pseudoobstruction Patient remains minimally symptomatic.  Tolerating regular diet and passing stools daily. Hopefully, this will resolve with increased physical activity and improvement in comorbidities.  We again discussed potentially adding neostigmine , but given his lack of significant symptoms, we we will continue to hold off on this for now. - Continue to maintain normal electrolytes, avoid narcotics/antihistamines/sedatives - Continue regular diet - Will reconsider neostigmine  if patient symptoms worsen - Anticipate pseudoobstruction will resolve spontaneously with improvement in mobility/acute cardiac issues  GI bleed/acute blood loss anemia Etiology unclear, but may have been diverticular.  He has not had any bleeding for about 5 days now and his hemoglobin has been stable.  Currently on Brilinta , aspirin  being held.  Oral anticoagulation also not started. - Probably safe to resume anticoagulation with heparin  drip -On Brilinta , aspirin  being held  Coronary artery disease status post stent with subsequent in-stent stenosis - On Brilinta   Systolic heart failure/cardiogenic shock, EF 20-25% - On digoxin , milrinone  drip, Lasix  drip  A-fib with RVR - On amiodarone   drip   Chase FORBES Holt, MD  02/08/2024, 11:55 AM  Gastroenterology

## 2024-02-08 NOTE — Progress Notes (Signed)
 IVT consulted for PICC line not giving blood return despite interventions. Line TPA'ed less than 48 hours ago. CXR obtained and tip noted to be malpositioned. Line powerflushed and pt left in high fowler position x 30 mintues followed by repeat cxr.  Tip noted to be in better position however blood return on all lumens not improved. Red lumen tpa'ed with success. All lumens flushed with good blood return. Primary RN notified

## 2024-02-08 NOTE — Progress Notes (Signed)
 PROGRESS NOTE    Chase Combs  FMW:[id number] DOB: 1945-10-25 DOA: 01/22/2024 PCP: Wonda Worth SQUIBB, PA    Brief Narrative:  78 year old with history of ischemic cardiomyopathy/LAD with PCI on 8/21, CHF, A-fib admitted to the hospital for nausea vomiting and CT suggestive of ischemic colitis, pneumatosis intestinalis/ileus.  General surgery recommended conservative management.  During hospitalization required PRBC transfusion due to blood loss anemia.  There was concerns of in-stent thrombosis from recent PCI therefore antiplatelets were switched from Cangrelor  to Plavix  and started on milrinone  drip due to cardiogenic shock.  Positive inotropes and diuretics were adjusted by CHF team.  EGD and flexible sigmoidoscopy with finding of red blood in the colon but no active bleeding, scattered diverticulosis. Grade C esophagitis, 1 superficial esophageal ulcer with no bleeding and a few erosions in the duodenum.  Recommended antiplatelet therapy and outpatient follow-up for colonoscopy. Slowly weaned off TPN, diet as tolerated.  GI recommending conservative management for colonic pseudoobstruction.  Cardiology managing CHF and atrial fibrillation.   Assessment & Plan:  Recent NSTEMI with acute in-stent thrombosis, CAD Acute congestive heart failure with reduced EF, ischemic cardiomyopathy, EF 20% Cardiogenic shock -Patient has been seen by cardiology and CHF team.  Ongoing antiplatelet management along with diabetes per their service.  Atrial fibrillation RVR; slow improvement.  -Appears to be rate controlled at this time.  Continue amiodarone  drip with plans to transition to p.o.[name]  Cardioversion is not recommended due to recent GI bleed.  Intestinal pneumatosis/colonic adynamic ileus Pseudoobstruction -Initial concerns of septic shock as well.  Treated with vancomycin  and thereafter transition to Zosyn  > Rocephin /Flagyl .  Completed antibiotic course 9/10. Reglan  q8hrs. May end up needing  neostigmine .  -Optimize electrolytes, ambulation and frequent repositioning - Weaned off TPN for now, dysphagia 3 diet - Seen by general surgery and GI  Acute blood loss anemia/GI bleed -EGD and flexible sigmoidoscopy with finding of red blood in the colon but no active bleeding, scattered diverticulosis. Grade C esophagitis, 1 superficial esophageal ulcer with no bleeding and a few erosions in the duodenum.  Recommended antiplatelet therapy and outpatient follow-up for colonoscopy. -PPI twice daily -Received total of 4 units of PRBC.  Hemoglobin now stable around 10.0.  Deconditioning -PT/OT-CIR, appreciate their input  DVT prophylaxis: SCDs  DNR/DNI Family Communication:  Florence Kubas; uptodate.  Status is: Inpatient Remains inpatient appropriate because: Ongoing multiple medical issues   PT Follow up Recs: Acute Inpatient Rehab (3hours/Day)02/05/2024 1400  Subjective: Doing ok Had a small BM this morning.  Examination:  General exam: Appears calm and comfortable  Respiratory system: Clear to auscultation. Respiratory effort normal. Cardiovascular system: S1 & S2 heard, RRR. No JVD, murmurs, rubs, gallops or clicks. No pedal edema. Gastrointestinal system: Diminished bowel sounds, Distended abd Central nervous system: Alert and oriented. No focal neurological deficits. Extremities: Symmetric 5 x 5 power. Skin: No rashes, lesions or ulcers Psychiatry: Judgement and insight appear normal. Mood & affect appropriate. PICC line in place in the left upper extremity               Diet Orders (From admission, onward)     Start     Ordered   02/05/24 0959  DIET DYS 3 Room service appropriate? Yes; Fluid consistency: Thin  Diet effective now       Question Answer Comment  Room service appropriate? Yes   Fluid consistency: Thin      02/05/24 0958            Objective: Vitals:  02/08/24 0403 02/08/24 0640 02/08/24 0745 02/08/24 1047  BP: 101/68  (!) 90/58 (!)  97/55  Pulse: 85  85 72  Resp: 20  19 15   Temp: 98 F (36.7 C)  98 F (36.7 C) 98 F (36.7 C)  TempSrc: Oral  Oral Oral  SpO2: 92%  96% 95%  Weight:  85.7 kg    Height:        Intake/Output Summary (Last 24 hours) at 02/08/2024 1233 Last data filed at 02/08/2024 1158 Gross per 24 hour  Intake 2007.09 ml  Output 3500 ml  Net -1492.91 ml   Filed Weights   02/06/24 0500 02/07/24 0434 02/08/24 0640  Weight: 91.4 kg 88 kg 85.7 kg    Scheduled Meds:  acetaZOLAMIDE   500 mg Oral BID   alteplase   2 mg Intracatheter Once   alteplase   2 mg Intracatheter Once   Chlorhexidine  Gluconate Cloth  6 each Topical Q0600   digoxin   0.0625 mg Oral Daily   feeding supplement  237 mL Oral TID BM   Gerhardt's butt cream   Topical BID   Influenza vac split trivalent PF  0.5 mL Intramuscular Tomorrow-1000   insulin  aspart  0-20 Units Subcutaneous Q4H   melatonin  5 mg Oral QHS   metoCLOPramide  (REGLAN ) injection  5 mg Intravenous Q8H   multivitamin with minerals  1 tablet Oral Daily   nystatin    Topical BID   pantoprazole  (PROTONIX ) IV  40 mg Intravenous Q12H   pneumococcal 20-valent conjugate vaccine  0.5 mL Intramuscular Tomorrow-1000   potassium chloride   40 mEq Oral Once   sodium chloride  flush  10-40 mL Intracatheter Q12H   sodium chloride  flush  3 mL Intravenous Q12H   spironolactone   25 mg Oral Daily   ticagrelor   90 mg Oral BID   traZODone   50 mg Oral QHS   Continuous Infusions:  amiodarone  30 mg/hr (02/08/24 0431)   famotidine  (PEPCID ) IV 20 mg (02/08/24 0827)   furosemide      furosemide  (LASIX ) 200 mg in dextrose  5 % 100 mL (2 mg/mL) infusion 20 mg/hr (02/08/24 1158)   milrinone  0.25 mcg/kg/min (02/08/24 0430)   ondansetron  (ZOFRAN ) IV      Nutritional status Signs/Symptoms: energy intake < 75% for > 7 days, moderate muscle depletion, mild muscle depletion Interventions: Boost Breeze, MVI, Refer to RD note for recommendations Body mass index is 25.63 kg/m.  Data Reviewed:    CBC: Recent Labs  Lab 02/04/24 0504 02/05/24 0554 02/06/24 0413 02/07/24 0445 02/08/24 0832  WBC 9.6 6.5 6.5 7.7 5.7  HGB 9.5* 8.5* 9.4* 10.1* 10.2*  HCT 29.0* 25.9* 28.8* 31.2* 31.8*  MCV 87.6 104.4* 86.0 86.4 87.1  PLT 335 345 460* 536* 530*   Basic Metabolic Panel: Recent Labs  Lab 02/04/24 0504 02/04/24 1724 02/05/24 0835 02/05/24 1120 02/06/24 0413 02/07/24 0445 02/08/24 0832  NA 132*  132*   < > 129* 131* 136 137 134*  K 4.1  4.1   < > 3.7 3.8 3.6 4.1 3.4*  CL 97*  96*   < > 92* 93* 91* 88* 85*  CO2 23  24   < > 26 24 31  33* 35*  GLUCOSE 202*  181*   < > 322* 123* 153* 204* 175*  BUN 41*  42*   < > 43* 46* 47* 52* 48*  CREATININE 1.20  1.20   < > 1.20 1.20 1.23 1.25* 1.51*  CALCIUM  8.0*  8.1*   < > 8.2* 8.7* 8.7* 8.7*  8.3*  MG 2.1  --   --  2.0 1.9 2.1 2.2  PHOS 5.8*  --  3.8 4.0 3.8 4.0 6.2*   < > = values in this interval not displayed.   GFR: Estimated Creatinine Clearance: 45.7 mL/min (A) (by C-G formula based on SCr of 1.51 mg/dL (H)). Liver Function Tests: Recent Labs  Lab 02/04/24 0504 02/05/24 0835 02/06/24 0413 02/07/24 0445 02/08/24 0832  AST 35 29 35 30 26  ALT 25 26 31  32 29  ALKPHOS 54 56 64 71 76  BILITOT 0.2 0.4 0.5 0.5 0.6  PROT 5.0* 5.3* 5.4* 5.6* 5.7*  ALBUMIN 2.0*  2.0* 2.1* 2.3* 2.4* 2.5*   No results for input(s): LIPASE, AMYLASE in the last 168 hours. No results for input(s): AMMONIA in the last 168 hours. Coagulation Profile: No results for input(s): INR, PROTIME in the last 168 hours. Cardiac Enzymes: No results for input(s): CKTOTAL, CKMB, CKMBINDEX, TROPONINI in the last 168 hours. BNP (last 3 results) Recent Labs    01/07/24 2301  PROBNP 15,245.0*   HbA1C: No results for input(s): HGBA1C in the last 72 hours. CBG: Recent Labs  Lab 02/07/24 1920 02/07/24 2305 02/08/24 0359 02/08/24 0743 02/08/24 1152  GLUCAP 218* 125* 116* 189* 118*   Lipid Profile: Recent Labs     02/07/24 0445  TRIG 97   Thyroid  Function Tests: No results for input(s): TSH, T4TOTAL, FREET4, T3FREE, THYROIDAB in the last 72 hours. Anemia Panel: No results for input(s): VITAMINB12, FOLATE, FERRITIN, TIBC, IRON, RETICCTPCT in the last 72 hours. Sepsis Labs: Recent Labs  Lab 02/01/24 1401 02/01/24 1514 02/02/24 0928  LATICACIDVEN 0.7 0.6 0.8    No results found for this or any previous visit (from the past 240 hours).       Radiology Studies: DG Abd 1 View Result Date: 02/07/2024 CLINICAL DATA:  Abdominal distention EXAM: ABDOMEN - 1 VIEW COMPARISON:  February 04, 2024 FINDINGS: Gaseous distention of the large bowel loops, stable to prior exam suggestive of ileus/obstruction. No radio-opaque calculi or other significant radiographic abnormality are seen. No pneumoperitoneum. Multilevel degenerative changes of the spine. IMPRESSION: Gaseous distention of the colon, stable to prior. Electronically Signed   By: Megan  Zare M.D.   On: 02/07/2024 11:33           LOS: 17 days   Time spent= 35 mins    Burgess JAYSON Dare, MD [facility]  If 7PM-7AM, please contact night-coverage  02/08/2024, 12:33 PM

## 2024-02-08 NOTE — Plan of Care (Signed)

## 2024-02-09 DIAGNOSIS — K5989 Other specified functional intestinal disorders: Secondary | ICD-10-CM | POA: Diagnosis not present

## 2024-02-09 DIAGNOSIS — K559 Vascular disorder of intestine, unspecified: Secondary | ICD-10-CM | POA: Diagnosis not present

## 2024-02-09 DIAGNOSIS — K5981 Ogilvie syndrome: Secondary | ICD-10-CM | POA: Diagnosis not present

## 2024-02-09 DIAGNOSIS — I2510 Atherosclerotic heart disease of native coronary artery without angina pectoris: Secondary | ICD-10-CM | POA: Diagnosis not present

## 2024-02-09 DIAGNOSIS — I4891 Unspecified atrial fibrillation: Secondary | ICD-10-CM | POA: Diagnosis not present

## 2024-02-09 DIAGNOSIS — K922 Gastrointestinal hemorrhage, unspecified: Secondary | ICD-10-CM | POA: Diagnosis not present

## 2024-02-09 DIAGNOSIS — I5023 Acute on chronic systolic (congestive) heart failure: Secondary | ICD-10-CM | POA: Diagnosis not present

## 2024-02-09 LAB — COMPREHENSIVE METABOLIC PANEL WITH GFR
ALT: 27 U/L (ref 0–44)
AST: 22 U/L (ref 15–41)
Albumin: 2.5 g/dL — ABNORMAL LOW (ref 3.5–5.0)
Alkaline Phosphatase: 77 U/L (ref 38–126)
Anion gap: 20 — ABNORMAL HIGH (ref 5–15)
BUN: 45 mg/dL — ABNORMAL HIGH (ref 8–23)
CO2: 29 mmol/L (ref 22–32)
Calcium: 8.3 mg/dL — ABNORMAL LOW (ref 8.9–10.3)
Chloride: 87 mmol/L — ABNORMAL LOW (ref 98–111)
Creatinine, Ser: 1.54 mg/dL — ABNORMAL HIGH (ref 0.61–1.24)
GFR, Estimated: 46 mL/min — ABNORMAL LOW (ref >60)
Glucose, Bld: 104 mg/dL — ABNORMAL HIGH (ref 70–99)
Potassium: 2.8 mmol/L — ABNORMAL LOW (ref 3.5–5.1)
Sodium: 136 mmol/L (ref 135–145)
Total Bilirubin: 0.6 mg/dL (ref 0.0–1.2)
Total Protein: 5.7 g/dL — ABNORMAL LOW (ref 6.5–8.1)

## 2024-02-09 LAB — HEPARIN LEVEL (UNFRACTIONATED)
Heparin Unfractionated: >1.1 [IU]/mL — ABNORMAL HIGH (ref 0.30–0.70)
Heparin Unfractionated: <0.1 [IU]/mL — ABNORMAL LOW (ref 0.30–0.70)

## 2024-02-09 LAB — BLOOD GAS, VENOUS
Acid-Base Excess: 13.5 mmol/L — ABNORMAL HIGH (ref 0.0–2.0)
Bicarbonate: 39.8 mmol/L — ABNORMAL HIGH (ref 20.0–28.0)
O2 Saturation: 36.8 %
Patient temperature: 37.7
pCO2, Ven: 58 mmHg (ref 44–60)
pH, Ven: 7.45 — ABNORMAL HIGH (ref 7.25–7.43)
pO2, Ven: <31 mmHg — CL (ref 32–45)

## 2024-02-09 LAB — LACTIC ACID, PLASMA
Lactic Acid, Venous: 1.8 mmol/L (ref 0.5–1.9)
Lactic Acid, Venous: 1.8 mmol/L (ref 0.5–1.9)

## 2024-02-09 LAB — CBC
HCT: 31.9 % — ABNORMAL LOW (ref 39.0–52.0)
Hemoglobin: 10 g/dL — ABNORMAL LOW (ref 13.0–17.0)
MCH: 27.6 pg (ref 26.0–34.0)
MCHC: 31.3 g/dL (ref 30.0–36.0)
MCV: 88.1 fL (ref 80.0–100.0)
Platelets: 543 K/uL — ABNORMAL HIGH (ref 150–400)
RBC: 3.62 MIL/uL — ABNORMAL LOW (ref 4.22–5.81)
RDW: 19.1 % — ABNORMAL HIGH (ref 11.5–15.5)
WBC: 5.8 K/uL (ref 4.0–10.5)
nRBC: 0 % (ref 0.0–0.2)

## 2024-02-09 LAB — MAGNESIUM: Magnesium: 2.1 mg/dL (ref 1.7–2.4)

## 2024-02-09 LAB — COOXEMETRY PANEL
Carboxyhemoglobin: 2 % — ABNORMAL HIGH (ref 0.5–1.5)
Methemoglobin: <0.7 % (ref 0.0–1.5)
O2 Saturation: 62.9 %
Total hemoglobin: 10.5 g/dL — ABNORMAL LOW (ref 12.0–16.0)

## 2024-02-09 LAB — GLUCOSE, CAPILLARY
Glucose-Capillary: 117 mg/dL — ABNORMAL HIGH (ref 70–99)
Glucose-Capillary: 153 mg/dL — ABNORMAL HIGH (ref 70–99)
Glucose-Capillary: 205 mg/dL — ABNORMAL HIGH (ref 70–99)
Glucose-Capillary: 228 mg/dL — ABNORMAL HIGH (ref 70–99)
Glucose-Capillary: 92 mg/dL (ref 70–99)
Glucose-Capillary: 95 mg/dL (ref 70–99)

## 2024-02-09 LAB — DIGOXIN LEVEL: Digoxin Level: <0.6 ng/mL — ABNORMAL LOW (ref 0.8–2.0)

## 2024-02-09 MED ORDER — HEPARIN (PORCINE) 25000 UT/250ML-% IV SOLN
1200.0000 [IU]/h | INTRAVENOUS | Status: DC
Start: 2024-02-09 — End: 2024-02-13
  Administered 2024-02-09: 600 [IU]/h via INTRAVENOUS
  Administered 2024-02-10: 900 [IU]/h via INTRAVENOUS
  Administered 2024-02-11: 1200 [IU]/h via INTRAVENOUS
  Filled 2024-02-09 (×3): qty 250

## 2024-02-09 MED ORDER — FAMOTIDINE 20 MG PO TABS
20.0000 mg | ORAL_TABLET | Freq: Every day | ORAL | Status: DC
  Administered 2024-02-09 – 2024-02-20 (×12): 20 mg via ORAL
  Filled 2024-02-09 (×12): qty 1

## 2024-02-09 MED ORDER — POTASSIUM CHLORIDE 10 MEQ/50ML IV SOLN
10.0000 meq | INTRAVENOUS | Status: AC
  Administered 2024-02-09 (×4): 10 meq via INTRAVENOUS
  Filled 2024-02-09 (×4): qty 50

## 2024-02-09 MED ORDER — INSULIN ASPART 100 UNIT/ML IJ SOLN
0.0000 [IU] | Freq: Three times a day (TID) | INTRAMUSCULAR | Status: DC
  Administered 2024-02-09: 3 [IU] via SUBCUTANEOUS
  Administered 2024-02-10: 1 [IU] via SUBCUTANEOUS
  Administered 2024-02-10 – 2024-02-11 (×4): 2 [IU] via SUBCUTANEOUS
  Administered 2024-02-12: 1 [IU] via SUBCUTANEOUS
  Administered 2024-02-12 – 2024-02-13 (×3): 2 [IU] via SUBCUTANEOUS
  Administered 2024-02-13 – 2024-02-14 (×2): 1 [IU] via SUBCUTANEOUS
  Administered 2024-02-14 – 2024-02-15 (×3): 2 [IU] via SUBCUTANEOUS
  Administered 2024-02-15: 1 [IU] via SUBCUTANEOUS

## 2024-02-09 MED ORDER — MILRINONE LACTATE IN DEXTROSE 20-5 MG/100ML-% IV SOLN
0.1250 ug/kg/min | INTRAVENOUS | Status: DC
  Administered 2024-02-09 – 2024-02-10 (×2): 0.125 ug/kg/min via INTRAVENOUS
  Filled 2024-02-09: qty 100

## 2024-02-09 MED ORDER — INSULIN ASPART 100 UNIT/ML IJ SOLN: 0.0000 [IU] | Freq: Every day | INTRAMUSCULAR | Status: DC

## 2024-02-09 MED ORDER — HEPARIN (PORCINE) 25000 UT/250ML-% IV SOLN
600.0000 [IU]/h | INTRAVENOUS | Status: DC
Start: 2024-02-09 — End: 2024-02-09
  Filled 2024-02-09: qty 250

## 2024-02-09 MED ORDER — CHLORHEXIDINE GLUCONATE CLOTH 2 % EX PADS
6.0000 | MEDICATED_PAD | Freq: Every day | CUTANEOUS | Status: DC
  Administered 2024-02-10 – 2024-02-24 (×15): 6 via TOPICAL

## 2024-02-09 MED ORDER — TORSEMIDE 20 MG PO TABS
20.0000 mg | ORAL_TABLET | Freq: Every day | ORAL | Status: DC
  Administered 2024-02-10 – 2024-02-12 (×3): 20 mg via ORAL
  Filled 2024-02-09 (×3): qty 1

## 2024-02-09 MED ORDER — INSULIN ASPART 100 UNIT/ML IJ SOLN: 0.0000 [IU] | Freq: Three times a day (TID) | INTRAMUSCULAR | Status: DC

## 2024-02-09 MED ORDER — SIMETHICONE 80 MG PO CHEW
80.0000 mg | CHEWABLE_TABLET | Freq: Four times a day (QID) | ORAL | Status: DC | PRN
  Administered 2024-02-13 – 2024-02-20 (×2): 80 mg via ORAL
  Filled 2024-02-09 (×3): qty 1

## 2024-02-09 MED ORDER — POTASSIUM CHLORIDE 10 MEQ/50ML IV SOLN
10.0000 meq | INTRAVENOUS | Status: DC
Start: 2024-02-09 — End: 2024-02-09

## 2024-02-09 MED ORDER — POTASSIUM CHLORIDE CRYS ER 20 MEQ PO TBCR
40.0000 meq | EXTENDED_RELEASE_TABLET | ORAL | Status: AC
  Administered 2024-02-09 (×2): 40 meq via ORAL
  Filled 2024-02-09 (×2): qty 2

## 2024-02-09 NOTE — Progress Notes (Signed)
 PROGRESS NOTE    Chase Combs  FMW:[id number] DOB: [birth year] DOA: 01/22/2024 PCP: Wonda Worth SQUIBB, PA    Brief Narrative:  78 year old with history of ischemic cardiomyopathy/LAD with PCI on 8/21, CHF, A-fib admitted to the hospital for nausea vomiting and CT suggestive of ischemic colitis, pneumatosis intestinalis/ileus.  General surgery recommended conservative management.  During hospitalization required PRBC transfusion due to blood loss anemia.  There was concerns of in-stent thrombosis from recent PCI therefore antiplatelets were switched from Cangrelor  to Plavix  and started on milrinone  drip due to cardiogenic shock.  Positive inotropes and diuretics were adjusted by CHF team.  EGD and flexible sigmoidoscopy with finding of red blood in the colon but no active bleeding, scattered diverticulosis. Grade C esophagitis, 1 superficial esophageal ulcer with no bleeding and a few erosions in the duodenum.  Recommended antiplatelet therapy and outpatient follow-up for colonoscopy. Slowly weaned off TPN, diet as tolerated.  GI recommending conservative management for colonic pseudoobstruction.  Cardiology managing CHF and atrial fibrillation.   Assessment & Plan:  Recent NSTEMI with acute in-stent thrombosis, CAD Acute congestive heart failure with reduced EF, ischemic cardiomyopathy, EF 20% Cardiogenic shock -Patient has been seen by cardiology and CHF team.  Ongoing antiplatelet management along with diabetes per their service.  Transition to p.o. torsemide   Atrial fibrillation RVR; slow improvement.  -Appears to be rate controlled at this time.  Continue amiodarone  drip with plans to transition to p.o.[name]  Cardioversion is not recommended due to recent GI bleed.  Intestinal pneumatosis/colonic adynamic ileus Pseudoobstruction -Initial concerns of septic shock as well.  Treated with vancomycin  and thereafter transition to Zosyn  > Rocephin /Flagyl .  Completed antibiotic course on 9/10.  On  Reglan  every 8 hours.  Seen by GI and general surgery who is recommending conservative management.  May end up requiring neostigmine  -Optimize electrolytes, ambulation and frequent repositioning - Weaned off TPN for now, dysphagia 3 diet  Metabolic acidosis Mild Renal Insuff, Likely prerenal - Lactic acid and VBG are normal -Will discuss backing off on diuretics with CHF team.   Hypokalemia - Aggressive repletion  Acute blood loss anemia/GI bleed -EGD and flexible sigmoidoscopy with finding of red blood in the colon but no active bleeding, scattered diverticulosis. Grade C esophagitis, 1 superficial esophageal ulcer with no bleeding and a few erosions in the duodenum.  Recommended antiplatelet therapy and outpatient follow-up for colonoscopy. -PPI twice daily -Received total of 4 units of PRBC.  Hemoglobin now stable around 10.0.  Deconditioning -PT/OT-CIR, appreciate their input  DVT prophylaxis: SCDs  DNR/DNI Family Communication:  Florence Kubas; uptodate.  Status is: Inpatient Remains inpatient appropriate because: Ongoing multiple medical issues   PT Follow up Recs: Acute Inpatient Rehab (3hours/Day)02/05/2024 1400  Subjective: Doing okay abdomen is still distended Examination:  General exam: Appears calm and comfortable  Respiratory system: Clear to auscultation. Respiratory effort normal. Cardiovascular system: S1 & S2 heard, RRR. No JVD, murmurs, rubs, gallops or clicks. No pedal edema. Gastrointestinal system: Diminished bowel sounds, Distended abd Central nervous system: Alert and oriented. No focal neurological deficits. Extremities: Symmetric 5 x 5 power. Skin: No rashes, lesions or ulcers Psychiatry: Judgement and insight appear normal. Mood & affect appropriate. PICC line in place in the left upper extremity           Wound 02/01/24 1600 Pressure Injury Buttocks Stage 2 -  Partial thickness loss of dermis presenting as a shallow open injury with a red,  pink wound bed without slough. (Active)  Diet Orders (From admission, onward)     Start     Ordered   02/05/24 0959  DIET DYS 3 Room service appropriate? Yes; Fluid consistency: Thin  Diet effective now       Question Answer Comment  Room service appropriate? Yes   Fluid consistency: Thin      02/05/24 0958            Objective: Vitals:   02/09/24 0353 02/09/24 0713 02/09/24 0906 02/09/24 1051  BP: 95/66 90/66  100/67  Pulse: 84 83 85 99  Resp: 20 20 18 17   Temp: 98.4 F (36.9 C) 98.4 F (36.9 C) 97.7 F (36.5 C) 97.8 F (36.6 C)  TempSrc: Oral Oral Oral Oral  SpO2: 100% 100% 100% 99%  Weight: 83.2 kg     Height:        Intake/Output Summary (Last 24 hours) at 02/09/2024 1146 Last data filed at 02/09/2024 1052 Gross per 24 hour  Intake 2077.64 ml  Output 3800 ml  Net -1722.36 ml   Filed Weights   02/07/24 0434 02/08/24 0640 02/09/24 0353  Weight: 88 kg 85.7 kg 83.2 kg    Scheduled Meds:  alteplase   2 mg Intracatheter Once   Chlorhexidine  Gluconate Cloth  6 each Topical Q0600   digoxin   0.0625 mg Oral Daily   famotidine   20 mg Oral Daily   feeding supplement  237 mL Oral TID BM   Gerhardt's butt cream   Topical BID   Influenza vac split trivalent PF  0.5 mL Intramuscular Tomorrow-1000   insulin  aspart  0-20 Units Subcutaneous Q4H   melatonin  5 mg Oral QHS   metoCLOPramide  (REGLAN ) injection  5 mg Intravenous Q8H   multivitamin with minerals  1 tablet Oral Daily   nystatin    Topical BID   pantoprazole  (PROTONIX ) IV  40 mg Intravenous Q12H   pneumococcal 20-valent conjugate vaccine  0.5 mL Intramuscular Tomorrow-1000   potassium chloride   40 mEq Oral Q4H   sodium chloride  flush  10-40 mL Intracatheter Q12H   sodium chloride  flush  3 mL Intravenous Q12H   spironolactone   25 mg Oral Daily   ticagrelor   90 mg Oral BID   [START ON 02/10/2024] torsemide   20 mg Oral Daily   traZODone   50 mg Oral QHS   Continuous Infusions:  amiodarone  30 mg/hr  (02/09/24 0401)   heparin      milrinone  0.125 mcg/kg/min (02/09/24 1113)   ondansetron  (ZOFRAN ) IV     potassium chloride  10 mEq (02/09/24 1031)    Nutritional status Signs/Symptoms: energy intake < 75% for > 7 days, moderate muscle depletion, mild muscle depletion Interventions: Boost Breeze, MVI, Refer to RD note for recommendations Body mass index is 24.87 kg/m.  Data Reviewed:   CBC: Recent Labs  Lab 02/05/24 0554 02/06/24 0413 02/07/24 0445 02/08/24 0832 02/09/24 0540  WBC 6.5 6.5 7.7 5.7 5.8  HGB 8.5* 9.4* 10.1* 10.2* 10.0*  HCT 25.9* 28.8* 31.2* 31.8* 31.9*  MCV 104.4* 86.0 86.4 87.1 88.1  PLT 345 460* 536* 530* 543*   Basic Metabolic Panel: Recent Labs  Lab 02/05/24 0835 02/05/24 1120 02/06/24 0413 02/07/24 0445 02/08/24 0832 02/08/24 1539 02/09/24 0540  NA 129* 131* 136 137 134* 134* 136  K 3.7 3.8 3.6 4.1 3.4* 4.0 2.8*  CL 92* 93* 91* 88* 85* 86* 87*  CO2 26 24 31  33* 35* 32 29  GLUCOSE 322* 123* 153* 204* 175* 225* 104*  BUN 43* 46* 47* 52* 48* 48*  45*  CREATININE 1.20 1.20 1.23 1.25* 1.51* 1.62* 1.54*  CALCIUM  8.2* 8.7* 8.7* 8.7* 8.3* 8.0* 8.3*  MG  --  2.0 1.9 2.1 2.2  --  2.1  PHOS 3.8 4.0 3.8 4.0 6.2*  --   --    GFR: Estimated Creatinine Clearance: 44.8 mL/min (A) (by C-G formula based on SCr of 1.54 mg/dL (H)). Liver Function Tests: Recent Labs  Lab 02/05/24 0835 02/06/24 0413 02/07/24 0445 02/08/24 0832 02/09/24 0540  AST 29 35 30 26 22   ALT 26 31 32 29 27  ALKPHOS 56 64 71 76 77  BILITOT 0.4 0.5 0.5 0.6 0.6  PROT 5.3* 5.4* 5.6* 5.7* 5.7*  ALBUMIN 2.1* 2.3* 2.4* 2.5* 2.5*   No results for input(s): LIPASE, AMYLASE in the last 168 hours. No results for input(s): AMMONIA in the last 168 hours. Coagulation Profile: No results for input(s): INR, PROTIME in the last 168 hours. Cardiac Enzymes: No results for input(s): CKTOTAL, CKMB, CKMBINDEX, TROPONINI in the last 168 hours. BNP (last 3 results) Recent Labs     01/07/24 2301  PROBNP 15,245.0*   HbA1C: No results for input(s): HGBA1C in the last 72 hours. CBG: Recent Labs  Lab 02/08/24 1949 02/09/24 0021 02/09/24 0352 02/09/24 0831 02/09/24 1103  GLUCAP 84 153* 95 205* 92   Lipid Profile: Recent Labs    02/07/24 0445  TRIG 97   Thyroid  Function Tests: No results for input(s): TSH, T4TOTAL, FREET4, T3FREE, THYROIDAB in the last 72 hours. Anemia Panel: No results for input(s): VITAMINB12, FOLATE, FERRITIN, TIBC, IRON, RETICCTPCT in the last 72 hours. Sepsis Labs: Recent Labs  Lab 02/09/24 0909  LATICACIDVEN 1.8    No results found for this or any previous visit (from the past 240 hours).       Radiology Studies: DG CHEST PORT 1 VIEW Result Date: 02/08/2024 CLINICAL DATA:  Status post PICC placement. EXAM: PORTABLE CHEST 1 VIEW COMPARISON:  Earlier today, 1-1/2 hours prior. FINDINGS: Left upper extremity PICC in place. The previous distal coil is different in position, likely facing anteriorly or posteriorly, but still appears angulated. Tip is in the region of the upper SVC. Unchanged bibasilar atelectasis. Stable heart size and mediastinal contours. No pneumothorax. IMPRESSION: Left upper extremity PICC in place. The previous distal coil is different in position, likely facing anteriorly or posteriorly, but still appears angulated. Tip is in the region of the upper SVC. Electronically Signed   By: Andrea Gasman M.D.   On: 02/08/2024 16:43   DG CHEST PORT 1 VIEW Result Date: 02/08/2024 CLINICAL DATA:  PICC line placement EXAM: PORTABLE CHEST 1 VIEW COMPARISON:  01/24/2024 FINDINGS: Left PICC line in place. The tip coils, possibly entering the azygous vein. Bibasilar atelectasis. No effusions. Heart mediastinal contours within normal limits. IMPRESSION: PICC line tip coils in the region of the SVC, possibly in the azygous vein. Bibasilar atelectasis. Electronically Signed   By: Franky Crease M.D.   On:  02/08/2024 13:03           LOS: 18 days   Time spent= 35 mins    Burgess JAYSON Dare, MD [facility]  If 7PM-7AM, please contact night-coverage  02/09/2024, 11:46 AM

## 2024-02-09 NOTE — Plan of Care (Signed)

## 2024-02-09 NOTE — Progress Notes (Signed)
 Consult for PICC assessment/ no blood return: Red lumen cap changed/ flushed x2. Other Brisk blood return noted from hub and safe set. Discussed with [place], RN. Recommend raising arm for better blood return. RN voiced understanding.

## 2024-02-09 NOTE — Progress Notes (Signed)
 PHARMACY - ANTICOAGULATION CONSULT NOTE  Pharmacy Consult for heparin  infusion Indication: atrial fibrillation  Allergies  Allergen Reactions   Codeine Nausea And Vomiting   Latex Rash    Oral blisters when used at dentist    Patient Measurements: Height: 6' (182.9 cm) Weight: 83.2 kg (183 lb 6.4 oz) IBW/kg (Calculated) : 77.6 HEPARIN  DW (KG): 91.1  Vital Signs: Temp: 97.9 F (36.6 C) (09/17 1928) Temp Source: Oral (09/17 1928) BP: 98/71 (09/17 1928) Pulse Rate: 91 (09/17 1928)  Labs: Recent Labs    02/07/24 0445 02/08/24 0832 02/08/24 1539 02/09/24 0540 02/09/24 1900 02/09/24 2152  HGB 10.1* 10.2*  --  10.0*  --   --   HCT 31.2* 31.8*  --  31.9*  --   --   PLT 536* 530*  --  543*  --   --   HEPARINUNFRC  --   --   --   --  >1.10* <0.10*  CREATININE 1.25* 1.51* 1.62* 1.54*  --   --     Estimated Creatinine Clearance: 44.8 mL/min (A) (by C-G formula based on SCr of 1.54 mg/dL (H)).   Medical History: Past Medical History:  Diagnosis Date   Abdominal hernia    per pt   Arthritis    hands   Benign localized prostatic hyperplasia with lower urinary tract symptoms (LUTS)    CAD (coronary artery disease) 05/2004   cardiologist--- dr anner;  positive stress test , had outpt cardiac cath 05-27-2004 stenosis RI;  06-18-2004 cath w/ PCI and BMS x1 to pRI;   STEMI 02-16-2009  s/p cath w/ PCI thrombectomy for total occluded RCA , BMS x1 to pRCA;  nuclear stress test 03-15-2012 low risk no ischemia but sig inferior-inferolateral infarct , ef 51%   Dyslipidemia    Essential hypertension    Heart murmur    History of COVID-19 05/2020   per pt mild symptoms that resolved   History of ST elevation myocardial infarction (STEMI) 02/16/2009   inferior STEMI   cath w/ pci w/ BMS to pRCA   History of urinary retention    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   urologist--- dr borden/ radiation oncology-- dr patrcia;  dx 12/ 2022,  Gleason 4+5, PSA 24.6, volume  94.6cc;  plan to start IMRT   OSA (obstructive sleep apnea)    per pt dx approx 2008, no cpap intolerant   PONV (postoperative nausea and vomiting)    Pre-diabetes    S/p bare metal coronary artery stent    01/ 2006  x1 BMS to pRI (CoStar study stent);  and 09/ 2010  x1 BMS to pRCA    Medications:  Scheduled:   [START ON 02/10/2024] Chlorhexidine  Gluconate Cloth  6 each Topical Q0600   digoxin   0.0625 mg Oral Daily   famotidine   20 mg Oral Daily   feeding supplement  237 mL Oral TID BM   Gerhardt's butt cream   Topical BID   Influenza vac split trivalent PF  0.5 mL Intramuscular Tomorrow-1000   insulin  aspart  0-5 Units Subcutaneous QHS   insulin  aspart  0-9 Units Subcutaneous TID WC   melatonin  5 mg Oral QHS   metoCLOPramide  (REGLAN ) injection  5 mg Intravenous Q8H   multivitamin with minerals  1 tablet Oral Daily   nystatin    Topical BID   pantoprazole  (PROTONIX ) IV  40 mg Intravenous Q12H   pneumococcal 20-valent conjugate vaccine  0.5 mL Intramuscular Tomorrow-1000   sodium chloride  flush  10-40 mL Intracatheter Q12H   sodium chloride  flush  3 mL Intravenous Q12H   spironolactone   25 mg Oral Daily   ticagrelor   90 mg Oral BID   [START ON 02/10/2024] torsemide   20 mg Oral Daily   traZODone   50 mg Oral QHS   Infusions:   amiodarone  30 mg/hr (02/09/24 1346)   heparin  600 Units/hr (02/09/24 1211)   milrinone  0.125 mcg/kg/min (02/09/24 1113)    Assessment: 78 yo M who presented with chest pain. Recently had coronary stent placed (August 2025), found to have in stent thrombosis. Thought to be clopidogrel  failure, transitioned to ticagrelor , clopidogrel  genetic testing in process. Developed a GIB. Last bloody bowel movement 9/10. PMH of Afib, HF. Pharmacy consulted for heparin  management.  Per GI, okay to trial anticoagulation. Hgb (10.0) and plts (543) stable. Multiple non-bloody bowel movements yesterday. Will start infusion without a bolus and target low goal with recent GIB,  conservative dosing.  9/17 PM update: Disregard 1900 Heparin  Level>1.10, dosing based on redrawn level resulting at <0.10. Will increase cautiously given recent GIB.  Goal of Therapy:  Heparin  level 0.3-0.5 units/ml Monitor platelets by anticoagulation protocol: Yes   Plan:  Increase heparin  infusion to 750 units/hr Check 8 hour heparin  level Monitor daily heparin  level, CBC, and signs/symptoms of bleeding   Thank you for allowing pharmacy to be a part of this patient's care.   Larraine Brazier, PharmD Clinical Pharmacist 02/09/2024  10:19 PM **Pharmacist phone directory can now be found on [email or web address] ([email or web address]).  Listed under [facility].

## 2024-02-09 NOTE — Progress Notes (Signed)
 Physical Therapy Treatment Patient Details Name: Chase Combs MRN: [id number] DOB: 01-15-1946 Today's Date: 02/09/2024   History of Present Illness 78 year old man admitted 8/30 with progressive nausea vomiting with CT images concerning for ischemic colitis; pneumatosis intestinalis/ileus. 9/9  recurrent STEMI with in-stent stenosis.[name]  PMH: ischemic cardiomyopathy and very recent LAD stent placement for NSTEMI discharged 8/27.    PT Comments  Pt received in supine, agreeable to therapy session, pt A&O and cooperative, eager to progress standing/gait tolerance. Pt able to perform x3 short gait trials with chair follow for safety and quick to fatigue after short household distances. Pt needing up to CGA for sit<>stand from elevated bed height and heavy use of arms for boosting, and CGA for gait with RW and mod cues for RW safety/proximity, but pt mostly did not work on turning as he would request to sit and have staff turn chair for him before getting back up to return to room. Pt reluctant to sit up in chair but agreeable with heavy encouragement. Patient will benefit from intensive inpatient follow-up therapy, >3 hours/day, he is making progress toward goals and remains motivated to build endurance to return home as soon as possible once he's able to manage with +1 assist.   If plan is discharge home, recommend the following: A lot of help with bathing/dressing/bathroom;Assistance with cooking/housework;Assist for transportation;A lot of help with walking and/or transfers (+2 safety-hallway amb)   Can travel by private vehicle        Equipment Recommendations  None recommended by PT    Recommendations for Other Services       Precautions / Restrictions Precautions Precautions: Fall Recall of Precautions/Restrictions: Intact Precaution/Restrictions Comments: watch BP/O2 Restrictions Weight Bearing Restrictions Per Provider Order: No     Mobility  Bed Mobility Overal bed mobility:  Needs Assistance Bed Mobility: Supine to Sit     Supine to sit: Supervision, HOB elevated, Used rails (HOB lowered to ~20 deg)     General bed mobility comments: Increased time/effort to perform; to R EOB    Transfers Overall transfer level: Needs assistance Equipment used: Rolling walker (2 wheels) Transfers: Sit to/from Stand Sit to Stand: Contact guard assist, Supervision, From elevated surface   Step pivot transfers: Contact guard assist, From elevated surface       General transfer comment: from EOB>RW with CGA and to/from [facility] and recliner chair surfaces to RW with CGA for safety for tactile cues for reaching back 50% of the time. Pt states he sleeps in a lift chair mostly and chair can be elevated to stand    Ambulation/Gait Ambulation/Gait assistance: Contact guard assist, +2 safety/equipment Gait Distance (Feet): 30 Feet (59ft, seated break, 6ft x2 with seated breaks (~67ft total)) Assistive device: Rolling walker (2 wheels) Gait Pattern/deviations: Step-through pattern, Decreased stride length, Trunk flexed, Drifts right/left Gait velocity: dec     General Gait Details: Cues for energy conservation, RW proximity, heavy BUE reliance on RW. +2 for chair follow for safety, pt needing seated break with c/o moderate to severe fatigue after second gait trial, but eager to perform additional trial to make it back to room, pt able to stand 3-5 mins total including standing for hygiene assist after toileting and ambulation before needing to sit. HR WFL, SpO2 92% and above on RA when good signal obtained, often noisy signal while mobilizing with sensor on his earlobe. DOE 3/4 with standing activity   Stairs  Wheelchair Mobility     Tilt Bed    Modified Rankin (Stroke Patients Only)       Balance Overall balance assessment: Needs assistance Sitting-balance support: Feet supported Sitting balance-Leahy Scale: Good     Standing balance support:  Bilateral upper extremity supported, During functional activity, Reliant on assistive device for balance Standing balance-Leahy Scale: Poor Standing balance comment: heavy reliance on RW                            Communication Communication Communication: No apparent difficulties Factors Affecting Communication: Hearing impaired  Cognition Arousal: Alert Behavior During Therapy: WFL for tasks assessed/performed   PT - Cognitive impairments: No apparent impairments                       PT - Cognition Comments: Cooperative but needs encouragement to start Following commands: Intact      Cueing Cueing Techniques: Verbal cues, Gestural cues  Exercises      General Comments General comments (skin integrity, edema, etc.): BP >100/60's sitting EOB initially, pt c/o dizziness but BP stable in standing as well. Pt reports history of vertigo      Pertinent Vitals/Pain Pain Assessment Pain Assessment: Faces Faces Pain Scale: Hurts a little bit Pain Location: generalized Pain Descriptors / Indicators: Discomfort, Grimacing Pain Intervention(s): Limited activity within patient's tolerance, Monitored during session, Repositioned    Home Living                          Prior Function            PT Goals (current goals can now be found in the care plan section) Acute Rehab PT Goals Patient Stated Goal: get well, return home, maybe rehab before home PT Goal Formulation: With patient Time For Goal Achievement: 02/19/24 Progress towards PT goals: Progressing toward goals    Frequency    Min 3X/week      PT Plan      Co-evaluation              AM-PAC PT 6 Clicks Mobility   Outcome Measure  Help needed turning from your back to your side while in a flat bed without using bedrails?: A Little Help needed moving from lying on your back to sitting on the side of a flat bed without using bedrails?: A Little Help needed moving to and from  a bed to a chair (including a wheelchair)?: A Little Help needed standing up from a chair using your arms (e.g., wheelchair or bedside chair)?: A Little Help needed to walk in hospital room?: A Lot (+2 for safety) Help needed climbing 3-5 steps with a railing? : Total 6 Click Score: 15    End of Session Equipment Utilized During Treatment: Gait belt Activity Tolerance: Patient tolerated treatment well;Patient limited by fatigue Patient left: with call bell/phone within reach;Other (comment);in chair;with chair alarm set (pt needs nurse call cord for chair alarm, has pad hooked up to green box and remains activated for safety under him in chair) Nurse Communication: Mobility status PT Visit Diagnosis: Unsteadiness on feet (R26.81);Other abnormalities of gait and mobility (R26.89);Muscle weakness (generalized) (M62.81);Difficulty in walking, not elsewhere classified (R26.2)     Time: 8750-8676 PT Time Calculation (min) (ACUTE ONLY): 34 min  Charges:    $Gait Training: 8-22 mins $Therapeutic Activity: 8-22 mins PT General Charges $$ ACUTE PT VISIT: 1 Visit  Connell SQUIBB., PTA Acute Rehabilitation Services Secure Chat Preferred 9a-5:30pm Office: [phone]    [name] [facility] 02/09/2024, 2:14 PM

## 2024-02-09 NOTE — Progress Notes (Signed)
 PHARMACY - ANTICOAGULATION CONSULT NOTE  Pharmacy Consult for heparin  infusion Indication: atrial fibrillation  Allergies  Allergen Reactions   Codeine Nausea And Vomiting   Latex Rash    Oral blisters when used at dentist    Patient Measurements: Height: 6' (182.9 cm) Weight: 83.2 kg (183 lb 6.4 oz) IBW/kg (Calculated) : 77.6 HEPARIN  DW (KG): 91.1  Vital Signs: Temp: 97.7 F (36.5 C) (09/17 0906) Temp Source: Oral (09/17 0906) BP: 90/66 (09/17 0713) Pulse Rate: 85 (09/17 0906)  Labs: Recent Labs    02/07/24 0445 02/08/24 0832 02/08/24 1539 02/09/24 0540  HGB 10.1* 10.2*  --  10.0*  HCT 31.2* 31.8*  --  31.9*  PLT 536* 530*  --  543*  CREATININE 1.25* 1.51* 1.62* 1.54*    Estimated Creatinine Clearance: 44.8 mL/min (A) (by C-G formula based on SCr of 1.54 mg/dL (H)).   Medical History: Past Medical History:  Diagnosis Date   Abdominal hernia    per pt   Arthritis    hands   Benign localized prostatic hyperplasia with lower urinary tract symptoms (LUTS)    CAD (coronary artery disease) 05/2004   cardiologist--- dr anner;  positive stress test , had outpt cardiac cath 05-27-2004 stenosis RI;  06-18-2004 cath w/ PCI and BMS x1 to pRI;   STEMI 02-16-2009  s/p cath w/ PCI thrombectomy for total occluded RCA , BMS x1 to pRCA;  nuclear stress test 03-15-2012 low risk no ischemia but sig inferior-inferolateral infarct , ef 51%   Dyslipidemia    Essential hypertension    Heart murmur    History of COVID-19 05/2020   per pt mild symptoms that resolved   History of ST elevation myocardial infarction (STEMI) 02/16/2009   inferior STEMI   cath w/ pci w/ BMS to pRCA   History of urinary retention    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   urologist--- dr borden/ radiation oncology-- dr patrcia;  dx 12/ 2022,  Gleason 4+5, PSA 24.6, volume 94.6cc;  plan to start IMRT   OSA (obstructive sleep apnea)    per pt dx approx 2008, no cpap intolerant   PONV  (postoperative nausea and vomiting)    Pre-diabetes    S/p bare metal coronary artery stent    01/ 2006  x1 BMS to pRI (CoStar study stent);  and 09/ 2010  x1 BMS to pRCA    Medications:  Scheduled:   alteplase   2 mg Intracatheter Once   Chlorhexidine  Gluconate Cloth  6 each Topical Q0600   digoxin   0.0625 mg Oral Daily   famotidine   20 mg Oral Daily   feeding supplement  237 mL Oral TID BM   Gerhardt's butt cream   Topical BID   Influenza vac split trivalent PF  0.5 mL Intramuscular Tomorrow-1000   insulin  aspart  0-20 Units Subcutaneous Q4H   melatonin  5 mg Oral QHS   metoCLOPramide  (REGLAN ) injection  5 mg Intravenous Q8H   multivitamin with minerals  1 tablet Oral Daily   nystatin    Topical BID   pantoprazole  (PROTONIX ) IV  40 mg Intravenous Q12H   pneumococcal 20-valent conjugate vaccine  0.5 mL Intramuscular Tomorrow-1000   potassium chloride   40 mEq Oral Q4H   sodium chloride  flush  10-40 mL Intracatheter Q12H   sodium chloride  flush  3 mL Intravenous Q12H   spironolactone   25 mg Oral Daily   ticagrelor   90 mg Oral BID   [START ON 02/10/2024] torsemide   20 mg Oral  Daily   traZODone   50 mg Oral QHS   Infusions:   amiodarone  30 mg/hr (02/09/24 0401)   milrinone      ondansetron  (ZOFRAN ) IV     potassium chloride  10 mEq (02/09/24 0905)    Assessment: 78 yo M who presented with chest pain. Recently had coronary stent placed (August 2025), found to have in stent thrombosis. Thought to be clopidogrel  failure, transitioned to ticagrelor , clopidogrel  genetic testing in process. Developed a GIB. Last bloody bowel movement 9/10. PMH of Afib, HF. Pharmacy consulted for heparin  management.  Per GI, okay to trial anticoagulation. Hgb (10.0) and plts (543) stable. Multiple non-bloody bowel movements yesterday. Will start infusion without a bolus and target low goal with recent GIB, conservative dosing.  Goal of Therapy:  Heparin  level 0.3-0.5 units/ml Monitor platelets by  anticoagulation protocol: Yes   Plan:  Start heparin  infusion at 600 units/hr Check 8 hour heparin  level Monitor daily heparin  level, CBC, and signs/symptoms of bleeding   Thank you for allowing pharmacy to be a part of this patient's care.   Nidia Schaffer, PharmD PGY2 Cardiology Pharmacy Resident  Please check AMION for all [facility] Pharmacy phone numbers After 10:00 PM, call Main Pharmacy [phone] 02/09/2024,10:12 AM

## 2024-02-09 NOTE — Progress Notes (Signed)
 [facility] ROUNDING NOTE   Subjective: Patient feeling about the same today.  Eating well without nausea or vomiting.  Had a liquid brown bowel movement early this morning.  No abdominal pain.   Objective: Vital signs in last 24 hours: Temp:  [97.7 F (36.5 C)-98.7 F (37.1 C)] 97.7 F (36.5 C) (09/17 0906) Pulse Rate:  [72-93] 85 (09/17 0906) Resp:  [15-20] 18 (09/17 0906) BP: (90-108)/(55-66) 90/66 (09/17 0713) SpO2:  [90 %-100 %] 100 % (09/17 0906) Weight:  [83.2 kg] 83.2 kg (09/17 0353) Last BM Date : 02/08/24 General: NAD, Caucasian male Lungs:  CTA b/l, no w/r/r Heart:  RRR, no m/r/g Abdomen:  Soft, distended, hypoactive, high pitched tinkling bowel sounds, nontender Ext:  No c/c/e    Intake/Output from previous day: 09/16 0701 - 09/17 0700 In: 1837.6 [P.O.:960; I.V.:827.6; IV Piggyback:50] Out: 2900 [Urine:2900] Intake/Output this shift: Total I/O In: 240 [P.O.:240] Out: 800 [Urine:800]   Lab Results: Recent Labs    02/07/24 0445 02/08/24 0832 02/09/24 0540  WBC 7.7 5.7 5.8  HGB 10.1* 10.2* 10.0*  PLT 536* 530* 543*  MCV 86.4 87.1 88.1   BMET Recent Labs    02/08/24 0832 02/08/24 1539 02/09/24 0540  NA 134* 134* 136  K 3.4* 4.0 2.8*  CL 85* 86* 87*  CO2 35* 32 29  GLUCOSE 175* 225* 104*  BUN 48* 48* 45*  CREATININE 1.51* 1.62* 1.54*  CALCIUM  8.3* 8.0* 8.3*   LFT Recent Labs    02/07/24 0445 02/08/24 0832 02/09/24 0540  PROT 5.6* 5.7* 5.7*  ALBUMIN 2.4* 2.5* 2.5*  AST 30 26 22   ALT 32 29 27  ALKPHOS 71 76 77  BILITOT 0.5 0.6 0.6   PT/INR No results for input(s): INR in the last 72 hours.    Imaging/Other results: DG CHEST PORT 1 VIEW Result Date: 02/08/2024 CLINICAL DATA:  Status post PICC placement. EXAM: PORTABLE CHEST 1 VIEW COMPARISON:  Earlier today, 1-1/2 hours prior. FINDINGS: Left upper extremity PICC in place. The previous distal coil is different in position, likely facing anteriorly or posteriorly, but  still appears angulated. Tip is in the region of the upper SVC. Unchanged bibasilar atelectasis. Stable heart size and mediastinal contours. No pneumothorax. IMPRESSION: Left upper extremity PICC in place. The previous distal coil is different in position, likely facing anteriorly or posteriorly, but still appears angulated. Tip is in the region of the upper SVC. Electronically Signed   By: Andrea Gasman M.D.   On: 02/08/2024 16:43   DG CHEST PORT 1 VIEW Result Date: 02/08/2024 CLINICAL DATA:  PICC line placement EXAM: PORTABLE CHEST 1 VIEW COMPARISON:  01/24/2024 FINDINGS: Left PICC line in place. The tip coils, possibly entering the azygous vein. Bibasilar atelectasis. No effusions. Heart mediastinal contours within normal limits. IMPRESSION: PICC line tip coils in the region of the SVC, possibly in the azygous vein. Bibasilar atelectasis. Electronically Signed   By: Franky Crease M.D.   On: 02/08/2024 13:03      Assessment and Plan:   78 year old male with complicated recent medical history to include coronary artery disease with stent placement and subsequent in-stent stenosis, acute systolic heart failure with hypervolemia, A-fib with RVR, admitted with clinical presentation suggestive of ischemic colitis/intestinal pneumatosis, with ongoing issues of colonic pseudoobstruction.  He also experienced GI bleed earlier in this admission prompting cessation of antiplatelet agents.   Colonic pseudoobstruction Patient remains minimally symptomatic.  Tolerating regular diet and passing stools daily. Hopefully, this will resolve with increased  physical activity and improvement in comorbidities.  We again discussed potentially adding neostigmine , but given his lack of significant symptoms, we we will continue to hold off on this for now. - Continue to maintain normal electrolytes, avoid narcotics/antihistamines/sedatives - Continue regular diet - Will reconsider neostigmine  if patient symptoms  worsen - Anticipate colonic pseudoobstruction will resolve spontaneously with improvement in mobility/acute cardiac issues   GI bleed/acute blood loss anemia Etiology unclear, but may have been diverticular.  He has not had any bleeding for about 5 days now and his hemoglobin has been stable.  Currently on Brilinta , aspirin  being held.  Oral anticoagulation also not started. - Probably safe to resume anticoagulation with heparin  drip -On Brilinta , aspirin  being held   Coronary artery disease status post stent with subsequent in-stent stenosis - On Brilinta    Systolic heart failure/cardiogenic shock, EF 20-25% - On digoxin , milrinone  drip, Lasix  drip   A-fib with RVR - On amiodarone  drip -- Okay to start anticoagulation from GI standpoint.  GI will follow peripherally.  Please contact if patient's symptoms worsen    [name], MD  02/09/2024, 10:24 AM [facility]

## 2024-02-09 NOTE — Progress Notes (Signed)
 Advanced Heart Failure Rounding Note  Cardiologist: Alm Clay, MD  HF Consulting Cardiologist: Dr. Zenaida  Chief Complaint: Acute on chronic HFrEF  Subjective:   9/9 PTCA LAD d/t stent thrombosis. A fib w/ RVR. CO-OX 44 %. Started on milrinone  0.25 mcg.  9/10:  EGD and flex sig 9/10.  EGD with 1 superficial esophageal ulcer, LA grade C reflux esophagitis with no bleeding. Flex sig: Red blood in the rectum, sigmoid colon, descending colon and transverse colon. Few diverticula found. No obvious source of bleeding.   CO-OX 63% on 0.25 milrinone .  CVP 7. Remains on lasix  gtt at 20 mg/hr+ 500 mg diamox  BID. 2.9L UOP charted last 24 hrs. Weight down another 6 lb.   No bleeding > 5 days. Hgb stable in 10s.  Occasional shortness of breath but currently comfortable. Reports good appetite.   Objective:   Weight Range: 83.2 kg Body mass index is 24.87 kg/m.   Vital Signs:   Temp:  [97.7 F (36.5 C)-98.7 F (37.1 C)] 98.4 F (36.9 C) (09/17 0713) Pulse Rate:  [72-93] 83 (09/17 0713) Resp:  [15-20] 20 (09/17 0713) BP: (90-108)/(55-66) 90/66 (09/17 0713) SpO2:  [90 %-100 %] 100 % (09/17 0713) Weight:  [83.2 kg] 83.2 kg (09/17 0353) Last BM Date : 02/08/24  Weight change: Filed Weights   02/07/24 0434 02/08/24 0640 02/09/24 0353  Weight: 88 kg 85.7 kg 83.2 kg    Intake/Output:   Intake/Output Summary (Last 24 hours) at 02/09/2024 0823 Last data filed at 02/09/2024 9281 Gross per 24 hour  Intake 1837.64 ml  Output 3700 ml  Net -1862.36 ml      Physical Exam  General:  Chronically ill appearing elderly male Cor: JVP to midneck. Irregular rhythm. No murmurs. Lungs: clear Abdomen: soft, nontender, + distended Extremities: 1+ edema, + TED hose Neuro: alert & orientedx3. Affect pleasant    Telemetry  Afib 80s, ~ 5 PVCs/min  Labs    CBC Recent Labs    02/08/24 0832 02/09/24 0540  WBC 5.7 5.8  HGB 10.2* 10.0*  HCT 31.8* 31.9*  MCV 87.1 88.1  PLT 530*  543*   Basic Metabolic Panel Recent Labs    90/84/74 0445 02/08/24 0832 02/08/24 1539 02/09/24 0540  NA 137 134* 134* 136  K 4.1 3.4* 4.0 2.8*  CL 88* 85* 86* 87*  CO2 33* 35* 32 29  GLUCOSE 204* 175* 225* 104*  BUN 52* 48* 48* 45*  CREATININE 1.25* 1.51* 1.62* 1.54*  CALCIUM  8.7* 8.3* 8.0* 8.3*  MG 2.1 2.2  --  2.1  PHOS 4.0 6.2*  --   --    Liver Function Tests Recent Labs    02/08/24 0832 02/09/24 0540  AST 26 22  ALT 29 27  ALKPHOS 76 77  BILITOT 0.6 0.6  PROT 5.7* 5.7*  ALBUMIN 2.5* 2.5*    Medications:   Scheduled Medications:  acetaZOLAMIDE   500 mg Oral BID   alteplase   2 mg Intracatheter Once   Chlorhexidine  Gluconate Cloth  6 each Topical Q0600   digoxin   0.0625 mg Oral Daily   famotidine   20 mg Oral Daily   feeding supplement  237 mL Oral TID BM   Gerhardt's butt cream   Topical BID   Influenza vac split trivalent PF  0.5 mL Intramuscular Tomorrow-1000   insulin  aspart  0-20 Units Subcutaneous Q4H   melatonin  5 mg Oral QHS   metoCLOPramide  (REGLAN ) injection  5 mg Intravenous Q8H   multivitamin with  minerals  1 tablet Oral Daily   nystatin    Topical BID   pantoprazole  (PROTONIX ) IV  40 mg Intravenous Q12H   pneumococcal 20-valent conjugate vaccine  0.5 mL Intramuscular Tomorrow-1000   potassium chloride   40 mEq Oral Q4H   sodium chloride  flush  10-40 mL Intracatheter Q12H   sodium chloride  flush  3 mL Intravenous Q12H   spironolactone   25 mg Oral Daily   ticagrelor   90 mg Oral BID   traZODone   50 mg Oral QHS    Infusions:  amiodarone  30 mg/hr (02/09/24 0401)   furosemide  (LASIX ) 200 mg in dextrose  5 % 100 mL (2 mg/mL) infusion 20 mg/hr (02/09/24 0401)   milrinone  0.25 mcg/kg/min (02/09/24 0401)   ondansetron  (ZOFRAN ) IV     potassium chloride       PRN Medications: acetaminophen , ALPRAZolam , HYDROmorphone  (DILAUDID ) injection, ipratropium-albuterol , ondansetron  (ZOFRAN ) IV, mouth rinse, polyethylene glycol, sodium chloride  flush, sodium  chloride flush  Patient Profile  Mr Sponaugle is a 78 year old with a history of ICM, chronic biventricular HFrEF, CAD s/p PCI to LAD in 08/25.[name]      Readmitted 01/22/24 with abdominal pain/nausea/vomiting. Hospital course complicated by ischemic colitis w/ septic shock/GI bleed, stent thrombosis involving recent LAD stent and low-output HF.   Assessment/Plan  CAD, recent STEMI d/t stent thrombosis - 01/17/24- PCI/DES LAD  - 02/01/24 Cangrelor  stopped given Plavix  load and ST elevation. Acute stent thrombosis involving LAD stent treated with PTCA. - Plavix  Genetic Testing collected, pending - Continue ticagrelor  90mg  BID.  - Further OAC and plavix  plan pending GI recovery, genotyping - Off aspirin  with GI bleed.  - Continue high-intensity statin.   New A fib RVR - Continue amio drip at 30 mg/hr while on milrinone , rate controlled. Can hopefully transition to PO soon once off milrinone  - Start low dose heparin  gtt today, if tolerates anticoagulation will plan for eventual TEE/DCCV. - Continue SCDs.  - Continue digoxin  0.0625   A/C HFrEF, ICM   -01/13/24 Echo EF  20-25% RV moderately reduced.   -02/01/24 Echo LVEF 20-25% RV moderately reduced.    - CO-OX 63% on 0.25 milrinone . Wean milrinone  to 0.125. Continue to follow CO-OX. - CVP 7. Stop lasix  gtt. Start 20 mg po Torsemide  tomorrow - Off SGLT2i with fungal rash.  - Continue spiro 25 mg daily - Continue digoxin  0.0625 mcg daily. Dig level < 0.6. - No BP room to further titrate GDMT   Anemia in the setting of GI Bleed -Transfused 9/8 with appropriate rise.  - 9/10 he had large bloody BM. Unfortunately given 600 mg Ibuprofen .  - Continue IV Protonix  and Pepcid  per GI - GI following.  - S/p EGD and flex sig.  EGD with 1 superficial esophageal ulcer, LA grade C reflux esophagitis with no bleeding. Flex sig: Red blood in the rectum, sigmoid colon, descending colon and transverse colon. Few diverticula found. No obvious source of bleeding,  possibly diverticular per GI. - Hgb stable at 10.   Septic Shock  - Intestinal pneumatosis w/ concern for ischemic vs infectious colitis (more likely ischemic) + ileus. Gen surgery recommended conservative management - Treated with antibiotics.  Hypokalemia - K 2.8 - Supp aggressively   Code status: DNR    Debility: Very weak, deconditioned. PT/OT recommending inpatient rehab.   Length of Stay: 18  Kaysey Berndt N, PA-C  02/09/2024, 8:23 AM  Advanced Heart Failure Team Pager [phone] (M-F; 7a - 5p)  Please contact [facility] for night-coverage after hours (5p -7a )  and weekends on [email or web address]

## 2024-02-09 NOTE — Progress Notes (Signed)
 Inpatient Rehab Admissions Coordinator:   Chart reviewed.  Pt improving with mobilization, increasing distance with decreased assist per last PT note.  He remains on amio gtt, milrinone  gtt, and starting on heparin  gtt with eventual plans for DCCV.  Reached out to cards to see what timing may be on that.  There remain several medical barriers to CIR at this time, and pt progressing with mobility.  We will continue to follow, but he is quickly approaching a level of mobility that I think would be difficult to get insurance approval with.    Reche Lowers, PT, DPT Admissions Coordinator [phone] 02/09/24  2:08 PM

## 2024-02-10 ENCOUNTER — Inpatient Hospital Stay (HOSPITAL_COMMUNITY)

## 2024-02-10 DIAGNOSIS — I5023 Acute on chronic systolic (congestive) heart failure: Secondary | ICD-10-CM | POA: Diagnosis not present

## 2024-02-10 DIAGNOSIS — I4891 Unspecified atrial fibrillation: Secondary | ICD-10-CM | POA: Diagnosis not present

## 2024-02-10 DIAGNOSIS — K559 Vascular disorder of intestine, unspecified: Secondary | ICD-10-CM | POA: Diagnosis not present

## 2024-02-10 LAB — COMPREHENSIVE METABOLIC PANEL WITH GFR
ALT: 21 U/L (ref 0–44)
AST: 17 U/L (ref 15–41)
Albumin: 2.2 g/dL — ABNORMAL LOW (ref 3.5–5.0)
Alkaline Phosphatase: 68 U/L (ref 38–126)
Anion gap: 20 — ABNORMAL HIGH (ref 5–15)
BUN: 34 mg/dL — ABNORMAL HIGH (ref 8–23)
CO2: 22 mmol/L (ref 22–32)
Calcium: 7 mg/dL — ABNORMAL LOW (ref 8.9–10.3)
Chloride: 83 mmol/L — ABNORMAL LOW (ref 98–111)
Creatinine, Ser: 1.21 mg/dL (ref 0.61–1.24)
GFR, Estimated: >60 mL/min (ref >60)
Glucose, Bld: 662 mg/dL (ref 70–99)
Potassium: 2.8 mmol/L — ABNORMAL LOW (ref 3.5–5.1)
Sodium: 125 mmol/L — ABNORMAL LOW (ref 135–145)
Total Bilirubin: 0.5 mg/dL (ref 0.0–1.2)
Total Protein: 4.8 g/dL — ABNORMAL LOW (ref 6.5–8.1)

## 2024-02-10 LAB — CBC
HCT: 28 % — ABNORMAL LOW (ref 39.0–52.0)
HCT: 28.1 % — ABNORMAL LOW (ref 39.0–52.0)
HCT: 33.4 % — ABNORMAL LOW (ref 39.0–52.0)
Hemoglobin: 8.8 g/dL — ABNORMAL LOW (ref 13.0–17.0)
Hemoglobin: 8.9 g/dL — ABNORMAL LOW (ref 13.0–17.0)
Hemoglobin: 10.7 g/dL — ABNORMAL LOW (ref 13.0–17.0)
MCH: 27.9 pg (ref 26.0–34.0)
MCH: 28 pg (ref 26.0–34.0)
MCH: 28.1 pg (ref 26.0–34.0)
MCHC: 31.4 g/dL (ref 30.0–36.0)
MCHC: 31.7 g/dL (ref 30.0–36.0)
MCHC: 32 g/dL (ref 30.0–36.0)
MCV: 88.9 fL (ref 80.0–100.0)
MCV: 88.4 fL (ref 80.0–100.0)
MCV: 87.7 fL (ref 80.0–100.0)
Platelets: 503 K/uL — ABNORMAL HIGH (ref 150–400)
Platelets: 502 K/uL — ABNORMAL HIGH (ref 150–400)
Platelets: 605 K/uL — ABNORMAL HIGH (ref 150–400)
RBC: 3.15 MIL/uL — ABNORMAL LOW (ref 4.22–5.81)
RBC: 3.18 MIL/uL — ABNORMAL LOW (ref 4.22–5.81)
RBC: 3.81 MIL/uL — ABNORMAL LOW (ref 4.22–5.81)
RDW: 19.1 % — ABNORMAL HIGH (ref 11.5–15.5)
RDW: 18.8 % — ABNORMAL HIGH (ref 11.5–15.5)
RDW: 18.9 % — ABNORMAL HIGH (ref 11.5–15.5)
WBC: 4.6 K/uL (ref 4.0–10.5)
WBC: 4.5 K/uL (ref 4.0–10.5)
WBC: 5.4 K/uL (ref 4.0–10.5)
nRBC: 0 % (ref 0.0–0.2)
nRBC: 0 % (ref 0.0–0.2)
nRBC: 0 % (ref 0.0–0.2)

## 2024-02-10 LAB — BASIC METABOLIC PANEL WITH GFR
Anion gap: 12 (ref 5–15)
BUN: 36 mg/dL — ABNORMAL HIGH (ref 8–23)
CO2: 27 mmol/L (ref 22–32)
Calcium: 8.4 mg/dL — ABNORMAL LOW (ref 8.9–10.3)
Chloride: 94 mmol/L — ABNORMAL LOW (ref 98–111)
Creatinine, Ser: 1.24 mg/dL (ref 0.61–1.24)
GFR, Estimated: >60 mL/min (ref >60)
Glucose, Bld: 227 mg/dL — ABNORMAL HIGH (ref 70–99)
Potassium: 3.1 mmol/L — ABNORMAL LOW (ref 3.5–5.1)
Sodium: 133 mmol/L — ABNORMAL LOW (ref 135–145)

## 2024-02-10 LAB — GLUCOSE, CAPILLARY
Glucose-Capillary: 133 mg/dL — ABNORMAL HIGH (ref 70–99)
Glucose-Capillary: 165 mg/dL — ABNORMAL HIGH (ref 70–99)
Glucose-Capillary: 186 mg/dL — ABNORMAL HIGH (ref 70–99)
Glucose-Capillary: 112 mg/dL — ABNORMAL HIGH (ref 70–99)

## 2024-02-10 LAB — COOXEMETRY PANEL
Carboxyhemoglobin: 1.2 % (ref 0.5–1.5)
Carboxyhemoglobin: 1.3 % (ref 0.5–1.5)
Methemoglobin: <0.7 % (ref 0.0–1.5)
Methemoglobin: <0.7 % (ref 0.0–1.5)
O2 Saturation: 53.3 %
O2 Saturation: 60 %
Total hemoglobin: 9.1 g/dL — ABNORMAL LOW (ref 12.0–16.0)
Total hemoglobin: 10.3 g/dL — ABNORMAL LOW (ref 12.0–16.0)

## 2024-02-10 LAB — BLOOD GAS, VENOUS
Acid-Base Excess: 5.3 mmol/L — ABNORMAL HIGH (ref 0.0–2.0)
Bicarbonate: 31.1 mmol/L — ABNORMAL HIGH (ref 20.0–28.0)
Drawn by: 64724
O2 Saturation: 58.3 %
Patient temperature: 36.6
pCO2, Ven: 48 mmHg (ref 44–60)
pH, Ven: 7.42 (ref 7.25–7.43)
pO2, Ven: 34 mmHg (ref 32–45)

## 2024-02-10 LAB — HEPARIN LEVEL (UNFRACTIONATED): Heparin Unfractionated: <0.1 [IU]/mL — ABNORMAL LOW (ref 0.30–0.70)

## 2024-02-10 LAB — LACTIC ACID, PLASMA: Lactic Acid, Venous: 1.2 mmol/L (ref 0.5–1.9)

## 2024-02-10 LAB — MAGNESIUM: Magnesium: 2 mg/dL (ref 1.7–2.4)

## 2024-02-10 MED ORDER — ALTEPLASE 2 MG IJ SOLR
2.0000 mg | Freq: Once | INTRAMUSCULAR | Status: AC
  Administered 2024-02-10: 2 mg
  Filled 2024-02-10: qty 2

## 2024-02-10 MED ORDER — POTASSIUM CHLORIDE CRYS ER 20 MEQ PO TBCR
40.0000 meq | EXTENDED_RELEASE_TABLET | ORAL | Status: DC
Start: 2024-02-10 — End: 2024-02-10
  Filled 2024-02-10: qty 2

## 2024-02-10 MED ORDER — POTASSIUM CHLORIDE 10 MEQ/50ML IV SOLN
10.0000 meq | INTRAVENOUS | Status: DC
  Filled 2024-02-10: qty 50

## 2024-02-10 MED ORDER — POTASSIUM CHLORIDE 10 MEQ/100ML IV SOLN
10.0000 meq | INTRAVENOUS | Status: AC
  Administered 2024-02-10 (×4): 10 meq via INTRAVENOUS
  Filled 2024-02-10 (×4): qty 100

## 2024-02-10 MED ORDER — POTASSIUM CHLORIDE 20 MEQ PO PACK
40.0000 meq | PACK | Freq: Once | ORAL | Status: AC
  Administered 2024-02-10: 40 meq via ORAL
  Filled 2024-02-10: qty 2

## 2024-02-10 MED ORDER — POTASSIUM CHLORIDE CRYS ER 20 MEQ PO TBCR
40.0000 meq | EXTENDED_RELEASE_TABLET | Freq: Once | ORAL | Status: AC
  Administered 2024-02-10: 40 meq via ORAL
  Filled 2024-02-10: qty 2

## 2024-02-10 NOTE — Progress Notes (Signed)
 More issues with obtaining blood return from PICC this morning. IV team consult added again.

## 2024-02-10 NOTE — Progress Notes (Signed)
 Physical Therapy Treatment Patient Details Name: Chase Combs MRN: [id number] DOB: [birth year] Today's Date: 02/10/2024   History of Present Illness 78 year old man admitted 8/30 with progressive nausea vomiting with CT images concerning for ischemic colitis; pneumatosis intestinalis/ileus. 9/9  recurrent STEMI with in-stent stenosis.[name]  PMH: ischemic cardiomyopathy and very recent LAD stent placement for NSTEMI discharged 8/27.    PT Comments  Pt received in recliner, agreeable to therapy session, spouse present and able to provide chair follow per pt request for safety. Pt verbalizes fear of falls. Pt needing up to CGA for transfers to/from chair<>RW and RW<>toilet with wall rail. Pt needing up to minA for turning/RW management but mostly CGA during gait trial, +2 for safety due to pt c/o increased fatigue but did not need to sit in chair before making it back to bedside. Pt c/o moderate to severe fatigue after ambulating to/from bedside/toilet in bathroom, with increased fatigue this date compared with previous session. SpO2 92-100% on RA when good signal able to be obtained. Patient will benefit from intensive inpatient follow-up therapy, >3 hours/day.    If plan is discharge home, recommend the following: A lot of help with bathing/dressing/bathroom;Assistance with cooking/housework;Assist for transportation;A lot of help with walking and/or transfers (+2 safety-hallway amb)   Can travel by private vehicle        Equipment Recommendations  None recommended by PT    Recommendations for Other Services       Precautions / Restrictions Precautions Precautions: Fall Recall of Precautions/Restrictions: Intact Precaution/Restrictions Comments: fear of falls; not on O2 at baseline Restrictions Weight Bearing Restrictions Per Provider Order: No     Mobility  Bed Mobility Overal bed mobility: Needs Assistance Bed Mobility: Supine to Sit           General bed mobility comments:  received in chair    Transfers Overall transfer level: Needs assistance Equipment used: Rolling walker (2 wheels) Transfers: Sit to/from Stand Sit to Stand: Contact guard assist, Supervision, From elevated surface           General transfer comment: chair<>RW, low toilet seat<>RW with L wall rail (pt has wall rail at home per spouse)    Ambulation/Gait Ambulation/Gait assistance: +2 safety/equipment, Min assist Gait Distance (Feet): 20 Feet (x2 with seated break) Assistive device: Rolling walker (2 wheels) Gait Pattern/deviations: Step-through pattern, Decreased stride length, Trunk flexed Gait velocity: dec     General Gait Details: Cues for energy conservation, RW proximity, +2 chair follow for safety per pt request although only planning to walk a short distance. Intermittent minA for turning and backward stepping due to need for AD mgmt/light stabiltiy assist; CGA for forward stepping in RW.   Stairs             Wheelchair Mobility     Tilt Bed    Modified Rankin (Stroke Patients Only)       Balance Overall balance assessment: Needs assistance Sitting-balance support: Feet supported Sitting balance-Leahy Scale: Good     Standing balance support: Bilateral upper extremity supported, During functional activity, Reliant on assistive device for balance Standing balance-Leahy Scale: Poor Standing balance comment: heavy reliance on RW                            Communication Communication Communication: No apparent difficulties Factors Affecting Communication: Hearing impaired  Cognition Arousal: Alert Behavior During Therapy: WFL for tasks assessed/performed   PT - Cognitive impairments: No apparent  impairments                       PT - Cognition Comments: Cooperative but needs encouragement to start, verbalizes fear of falls and requests +2 for ambulation even for short distances. Following commands: Intact (HoH)      Cueing  Cueing Techniques: Verbal cues, Gestural cues  Exercises      General Comments General comments (skin integrity, edema, etc.): totalA for posterior hygiene after toileting; loose stool in toilet, NT notified (not flushed in case RN needs to chart it, NT/RN notified)      Pertinent Vitals/Pain Pain Assessment Pain Assessment: Faces Faces Pain Scale: Hurts a little bit Pain Location: generalized Pain Descriptors / Indicators: Discomfort Pain Intervention(s): Limited activity within patient's tolerance, Monitored during session, Repositioned    Home Living                          Prior Function            PT Goals (current goals can now be found in the care plan section) Acute Rehab PT Goals Patient Stated Goal: get well, return home, maybe rehab before home PT Goal Formulation: With patient Time For Goal Achievement: 02/19/24 Progress towards PT goals: Progressing toward goals    Frequency    Min 3X/week      PT Plan      Co-evaluation              AM-PAC PT 6 Clicks Mobility   Outcome Measure  Help needed turning from your back to your side while in a flat bed without using bedrails?: A Little Help needed moving from lying on your back to sitting on the side of a flat bed without using bedrails?: A Little Help needed moving to and from a bed to a chair (including a wheelchair)?: A Little Help needed standing up from a chair using your arms (e.g., wheelchair or bedside chair)?: A Little Help needed to walk in hospital room?: A Lot (+2 for safety) Help needed climbing 3-5 steps with a railing? : Total 6 Click Score: 15    End of Session Equipment Utilized During Treatment: Gait belt Activity Tolerance: Patient tolerated treatment well;Patient limited by fatigue Patient left: with call bell/phone within reach;Other (comment);in chair;with chair alarm set (pt needs nurse call cord for chair alarm, has pad hooked up to green box and remains  activated for safety under him in chair) Nurse Communication: Mobility status;Other (comment) (pt needs grey cord from green chair alarm box to secretary call panel on wall) PT Visit Diagnosis: Unsteadiness on feet (R26.81);Other abnormalities of gait and mobility (R26.89);Muscle weakness (generalized) (M62.81);Difficulty in walking, not elsewhere classified (R26.2)     Time: 8583-8560 PT Time Calculation (min) (ACUTE ONLY): 23 min  Charges:    $Gait Training: 8-22 mins $Therapeutic Activity: 8-22 mins PT General Charges $$ ACUTE PT VISIT: 1 Visit                     Kaili Castille P., PTA Acute Rehabilitation Services Secure Chat Preferred 9a-5:30pm Office: [phone]    Connell CHRISTELLA Blue 02/10/2024, 3:35 PM

## 2024-02-10 NOTE — Progress Notes (Signed)
 PHARMACY - ANTICOAGULATION CONSULT NOTE  Pharmacy Consult for heparin  infusion Indication: atrial fibrillation  Allergies  Allergen Reactions   Codeine Nausea And Vomiting   Latex Rash    Oral blisters when used at dentist    Patient Measurements: Height: 6' (182.9 cm) Weight: 83.3 kg (183 lb 9.6 oz) IBW/kg (Calculated) : 77.6 HEPARIN  DW (KG): 91.1  Vital Signs: Temp: 97.9 F (36.6 C) (09/18 0404) Temp Source: Oral (09/18 0404) BP: 95/73 (09/18 0404) Pulse Rate: 88 (09/18 0422)  Labs: Recent Labs    02/08/24 0832 02/08/24 1539 02/09/24 0540 02/09/24 1900 02/09/24 2152 02/10/24 0709 02/10/24 0710  HGB 10.2*  --  10.0*  --   --  8.9*  --   HCT 31.8*  --  31.9*  --   --  28.1*  --   PLT 530*  --  543*  --   --  502*  --   HEPARINUNFRC  --   --   --  >1.10* <0.10*  --  <0.10*  CREATININE 1.51* 1.62* 1.54*  --   --   --   --     Estimated Creatinine Clearance: 44.8 mL/min (A) (by C-G formula based on SCr of 1.54 mg/dL (H)).   Medical History: Past Medical History:  Diagnosis Date   Abdominal hernia    per pt   Arthritis    hands   Benign localized prostatic hyperplasia with lower urinary tract symptoms (LUTS)    CAD (coronary artery disease) 05/2004   cardiologist--- dr anner;  positive stress test , had outpt cardiac cath 05-27-2004 stenosis RI;  06-18-2004 cath w/ PCI and BMS x1 to pRI;   STEMI 02-16-2009  s/p cath w/ PCI thrombectomy for total occluded RCA , BMS x1 to pRCA;  nuclear stress test 03-15-2012 low risk no ischemia but sig inferior-inferolateral infarct , ef 51%   Dyslipidemia    Essential hypertension    Heart murmur    History of COVID-19 05/2020   per pt mild symptoms that resolved   History of ST elevation myocardial infarction (STEMI) 02/16/2009   inferior STEMI   cath w/ pci w/ BMS to pRCA   History of urinary retention    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   urologist--- dr borden/ radiation oncology-- dr patrcia;  dx 12/  2022,  Gleason 4+5, PSA 24.6, volume 94.6cc;  plan to start IMRT   OSA (obstructive sleep apnea)    per pt dx approx 2008, no cpap intolerant   PONV (postoperative nausea and vomiting)    Pre-diabetes    S/p bare metal coronary artery stent    01/ 2006  x1 BMS to pRI (CoStar study stent);  and 09/ 2010  x1 BMS to pRCA    Medications:  Scheduled:   Chlorhexidine  Gluconate Cloth  6 each Topical Q0600   digoxin   0.0625 mg Oral Daily   famotidine   20 mg Oral Daily   feeding supplement  237 mL Oral TID BM   Gerhardt's butt cream   Topical BID   Influenza vac split trivalent PF  0.5 mL Intramuscular Tomorrow-1000   insulin  aspart  0-5 Units Subcutaneous QHS   insulin  aspart  0-9 Units Subcutaneous TID WC   melatonin  5 mg Oral QHS   metoCLOPramide  (REGLAN ) injection  5 mg Intravenous Q8H   multivitamin with minerals  1 tablet Oral Daily   nystatin    Topical BID   pantoprazole  (PROTONIX ) IV  40 mg Intravenous Q12H   pneumococcal  20-valent conjugate vaccine  0.5 mL Intramuscular Tomorrow-1000   sodium chloride  flush  10-40 mL Intracatheter Q12H   sodium chloride  flush  3 mL Intravenous Q12H   spironolactone   25 mg Oral Daily   ticagrelor   90 mg Oral BID   torsemide   20 mg Oral Daily   traZODone   50 mg Oral QHS   Infusions:   amiodarone  30 mg/hr (02/10/24 0210)   heparin  750 Units/hr (02/09/24 2227)   milrinone  0.125 mcg/kg/min (02/10/24 0209)    Assessment: 78 yo M who presented with chest pain. Recently had coronary stent placed (August 2025), found to have in stent thrombosis. Thought to be clopidogrel  failure, transitioned to ticagrelor , clopidogrel  genetic testing in process. Developed a GIB. Last bloody bowel movement 9/10. PMH of Afib, HF. Pharmacy consulted for heparin  management.  Heparin  level < 0.1 is subtherapeutic with heparin  running at 750 units/hr. Hgb (8.9) is down slightly. PLTs (502) are stable. Per RN, no report of pauses, issues with the line, or signs of  bleeding. Bowel movements with no blood or dark tarry stool. Holding off on heparin  titration until repeat CBC this afternoon, if stable then will target full-dose heparin  per AHF MD.   Goal of Therapy:  Heparin  level 0.3-0.5 units/ml Monitor platelets by anticoagulation protocol: Yes   Plan:  F/u CBC, if hemoglobin is stable will discuss heparin  titration with Dr. Zenaida Monitor daily heparin  level, CBC, and signs/symptoms of bleeding   Thank you for allowing pharmacy to be a part of this patient's care.   Nidia Schaffer, PharmD PGY2 Cardiology Pharmacy Resident  Please check AMION for all [facility] Pharmacy phone numbers After 10:00 PM, call Main Pharmacy [phone] 02/10/2024,7:54 AM

## 2024-02-10 NOTE — Progress Notes (Addendum)
 Advanced Heart Failure Rounding Note  Cardiologist: Alm Clay, MD  HF Consulting Cardiologist: Dr. Zenaida  Chief Complaint: Acute on chronic HFrEF  Subjective:   9/9 PTCA LAD d/t stent thrombosis. A fib w/ RVR. CO-OX 44 %. Started on milrinone  0.25 mcg.  9/10:  EGD and flex sig 9/10.  EGD with 1 superficial esophageal ulcer, LA grade C reflux esophagitis with no bleeding. Flex sig: Red blood in the rectum, sigmoid colon, descending colon and transverse colon. Few diverticula found. No obvious source of bleeding.   CO-OX 53% on 0.125 milrinone  with Fick CI 2.2.   CVP 7.   No bleeding after restarting low dose heparin  gtt yesterday. Hgb 10>8.9.   Multiple watery bowel movements this am.     Objective:   Weight Range: 83.3 kg Body mass index is 24.9 kg/m.   Vital Signs:   Temp:  [97.7 F (36.5 C)-97.9 F (36.6 C)] 97.8 F (36.6 C) (09/18 0820) Pulse Rate:  [88-96] 88 (09/18 0422) Resp:  [13-24] 24 (09/18 0820) BP: (95-109)/(59-73) 97/59 (09/18 0820) SpO2:  [97 %-100 %] 97 % (09/18 0820) Weight:  [83.3 kg] 83.3 kg (09/18 0404) Last BM Date : 02/10/24  Weight change: Filed Weights   02/08/24 0640 02/09/24 0353 02/10/24 0404  Weight: 85.7 kg 83.2 kg 83.3 kg    Intake/Output:   Intake/Output Summary (Last 24 hours) at 02/10/2024 1136 Last data filed at 02/10/2024 0911 Gross per 24 hour  Intake 1243.43 ml  Output 1000 ml  Net 243.43 ml      Physical Exam  General:  Fatigued appearing. Sitting up in chair. Cor: JVP 7-8 cm. Irregular rhythm. No murmurs. Lungs: breathing nonlabored Abdomen: soft, nontender, + distended Extremities: 1+ edema, + TED hose Neuro: alert & orientedx3. Affect flat.  Telemetry  Afib 80s   Labs    CBC Recent Labs    02/09/24 0540 02/10/24 0709  WBC 5.8 4.5  HGB 10.0* 8.9*  HCT 31.9* 28.1*  MCV 88.1 88.4  PLT 543* 502*   Basic Metabolic Panel Recent Labs    90/83/74 0832 02/08/24 1539 02/09/24 0540  02/10/24 0709  NA 134*   < > 136 125*  K 3.4*   < > 2.8* 2.8*  CL 85*   < > 87* 83*  CO2 35*   < > 29 22  GLUCOSE 175*   < > 104* 662*  BUN 48*   < > 45* 34*  CREATININE 1.51*   < > 1.54* 1.21  CALCIUM  8.3*   < > 8.3* 7.0*  MG 2.2  --  2.1 2.0  PHOS 6.2*  --   --   --    < > = values in this interval not displayed.   Liver Function Tests Recent Labs    02/09/24 0540 02/10/24 0709  AST 22 17  ALT 27 21  ALKPHOS 77 68  BILITOT 0.6 0.5  PROT 5.7* 4.8*  ALBUMIN 2.5* 2.2*    Medications:   Scheduled Medications:  Chlorhexidine  Gluconate Cloth  6 each Topical Q0600   digoxin   0.0625 mg Oral Daily   famotidine   20 mg Oral Daily   feeding supplement  237 mL Oral TID BM   Gerhardt's butt cream   Topical BID   Influenza vac split trivalent PF  0.5 mL Intramuscular Tomorrow-1000   insulin  aspart  0-5 Units Subcutaneous QHS   insulin  aspart  0-9 Units Subcutaneous TID WC   melatonin  5 mg Oral QHS  metoCLOPramide  (REGLAN ) injection  5 mg Intravenous Q8H   multivitamin with minerals  1 tablet Oral Daily   nystatin    Topical BID   pantoprazole  (PROTONIX ) IV  40 mg Intravenous Q12H   pneumococcal 20-valent conjugate vaccine  0.5 mL Intramuscular Tomorrow-1000   potassium chloride   40 mEq Oral Q4H   sodium chloride  flush  10-40 mL Intracatheter Q12H   sodium chloride  flush  3 mL Intravenous Q12H   spironolactone   25 mg Oral Daily   ticagrelor   90 mg Oral BID   torsemide   20 mg Oral Daily   traZODone   50 mg Oral QHS    Infusions:  amiodarone  30 mg/hr (02/10/24 0210)   heparin  750 Units/hr (02/09/24 2227)   milrinone  0.125 mcg/kg/min (02/10/24 0209)   potassium chloride       PRN Medications: acetaminophen , ALPRAZolam , HYDROmorphone  (DILAUDID ) injection, ipratropium-albuterol , ondansetron  (ZOFRAN ) IV, mouth rinse, polyethylene glycol, simethicone , sodium chloride  flush, sodium chloride  flush  Patient Profile  Chase Combs is a 78 year old with a history of ICM, chronic  biventricular HFrEF, CAD s/p PCI to LAD in 08/25.[name]      Readmitted 01/22/24 with abdominal pain/nausea/vomiting. Hospital course complicated by ischemic colitis w/ septic shock/GI bleed, stent thrombosis involving recent LAD stent and low-output HF.   Assessment/Plan  CAD, recent STEMI d/t stent thrombosis - 01/17/24- PCI/DES LAD  - 02/01/24 Cangrelor  stopped given Plavix  load and ST elevation. Acute stent thrombosis involving LAD stent treated with PTCA. - Plavix  Genetic Testing collected, pending - Continue ticagrelor  90mg  BID.  - Off aspirin  with GI bleed.  - Continue high-intensity statin.   New A fib RVR - Continue amio drip at 30 mg/hr while on milrinone , rate controlled. Can hopefully transition to PO soon once off milrinone  - Hgb down slightly. Recheck CBC. If hemoglobin stable will increase heparin  gtt and plan for TEE/DCCV tomorrow. Will keep NPO after midnight. - Continue SCDs.  - Continue digoxin  0.0625   A/C HFrEF, ICM   -01/13/24 Echo EF  20-25% RV moderately reduced.   -02/01/24 Echo LVEF 20-25% RV moderately reduced.    - CO-OX 53% with Fick CI 2.2 on 0.125 milrinone . Recheck CO-OX. If > 55%, will plan to stop milrinone . - CVP 7. Continue Torsemide  20 mg daily - Off SGLT2i with fungal rash.  - Continue spiro 25 mg daily - Continue digoxin  0.0625 mcg daily. Dig level < 0.6. - No BP room to further titrate GDMT   Anemia in the setting of GI Bleed -Transfused 9/8 with appropriate rise.  - 9/10 he had large bloody BM. Unfortunately given 600 mg Ibuprofen .  - Protonix  and Pepcid  per GI - GI following.  - S/p EGD and flex sig.  EGD with 1 superficial esophageal ulcer, LA grade C reflux esophagitis with no bleeding. Flex sig: Red blood in the rectum, sigmoid colon, descending colon and transverse colon. Few diverticula found. No obvious source of bleeding, possibly diverticular per GI. - Hgb down slightly, see above  Intestinal pneumatosis w/ concern for ischemic  colitis Ileus Psuedoobstruction - Gen surgery recommended conservative management - Treated with antibiotics for suspected septic shock. - Abdomen distended. Transverse colon measures 9.3 cm on xray today  Hypokalemia - K 2.8 w/ multiple other derangements on AM labs including glucose 662. Suspect sample error, gtt running through line. Recheck. - Supp ordered   Code status: DNR    Debility: Very weak, deconditioned. PT/OT recommending inpatient rehab.   Length of Stay: 19  Chase Dewilde N, PA-C  02/10/2024, 11:36 AM  Advanced Heart Failure Team Pager [phone] (M-F; 7a - 5p)  Please contact [facility] for night-coverage after hours (5p -7a ) and weekends on [email or web address]

## 2024-02-10 NOTE — Progress Notes (Signed)
 PHARMACY - ANTICOAGULATION CONSULT NOTE  Pharmacy Consult for heparin  infusion Indication: atrial fibrillation  Allergies  Allergen Reactions   Codeine Nausea And Vomiting   Latex Rash    Oral blisters when used at dentist    Patient Measurements: Height: 6' (182.9 cm) Weight: 83.3 kg (183 lb 9.6 oz) IBW/kg (Calculated) : 77.6 HEPARIN  DW (KG): 91.1  Vital Signs: Temp: 97.8 F (36.6 C) (09/18 1136) Temp Source: Oral (09/18 1136) BP: 108/74 (09/18 1136) Pulse Rate: 91 (09/18 1136)  Labs: Recent Labs    02/09/24 0540 02/09/24 1900 02/09/24 2152 02/10/24 0500 02/10/24 0709 02/10/24 0710 02/10/24 1130 02/10/24 1504  HGB 10.0*  --   --  8.8* 8.9*  --   --  10.7*  HCT 31.9*  --   --  28.0* 28.1*  --   --  33.4*  PLT 543*  --   --  503* 502*  --   --  605*  HEPARINUNFRC  --  >1.10* <0.10*  --   --  <0.10*  --   --   CREATININE 1.54*  --   --   --  1.21  --  1.24  --     Estimated Creatinine Clearance: 55.6 mL/min (by C-G formula based on SCr of 1.24 mg/dL).   Medical History: Past Medical History:  Diagnosis Date   Abdominal hernia    per pt   Arthritis    hands   Benign localized prostatic hyperplasia with lower urinary tract symptoms (LUTS)    CAD (coronary artery disease) 05/2004   cardiologist--- dr anner;  positive stress test , had outpt cardiac cath 05-27-2004 stenosis RI;  06-18-2004 cath w/ PCI and BMS x1 to pRI;   STEMI 02-16-2009  s/p cath w/ PCI thrombectomy for total occluded RCA , BMS x1 to pRCA;  nuclear stress test 03-15-2012 low risk no ischemia but sig inferior-inferolateral infarct , ef 51%   Dyslipidemia    Essential hypertension    Heart murmur    History of COVID-19 05/2020   per pt mild symptoms that resolved   History of ST elevation myocardial infarction (STEMI) 02/16/2009   inferior STEMI   cath w/ pci w/ BMS to pRCA   History of urinary retention    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   urologist--- dr borden/ radiation  oncology-- dr patrcia;  dx 12/ 2022,  Gleason 4+5, PSA 24.6, volume 94.6cc;  plan to start IMRT   OSA (obstructive sleep apnea)    per pt dx approx 2008, no cpap intolerant   PONV (postoperative nausea and vomiting)    Pre-diabetes    S/p bare metal coronary artery stent    01/ 2006  x1 BMS to pRI (CoStar study stent);  and 09/ 2010  x1 BMS to pRCA    Medications:  Scheduled:   Chlorhexidine  Gluconate Cloth  6 each Topical Q0600   digoxin   0.0625 mg Oral Daily   famotidine   20 mg Oral Daily   feeding supplement  237 mL Oral TID BM   Gerhardt's butt cream   Topical BID   Influenza vac split trivalent PF  0.5 mL Intramuscular Tomorrow-1000   insulin  aspart  0-5 Units Subcutaneous QHS   insulin  aspart  0-9 Units Subcutaneous TID WC   melatonin  5 mg Oral QHS   multivitamin with minerals  1 tablet Oral Daily   nystatin    Topical BID   pantoprazole  (PROTONIX ) IV  40 mg Intravenous Q12H   pneumococcal 20-valent  conjugate vaccine  0.5 mL Intramuscular Tomorrow-1000   potassium chloride   40 mEq Oral Once   sodium chloride  flush  10-40 mL Intracatheter Q12H   sodium chloride  flush  3 mL Intravenous Q12H   spironolactone   25 mg Oral Daily   ticagrelor   90 mg Oral BID   torsemide   20 mg Oral Daily   traZODone   50 mg Oral QHS   Infusions:   amiodarone  30 mg/hr (02/10/24 1357)   heparin  750 Units/hr (02/09/24 2227)   potassium chloride  10 mEq (02/10/24 1526)    Assessment: 78 yo M who presented with chest pain. Recently had coronary stent placed (August 2025), found to have in stent thrombosis. Thought to be clopidogrel  failure, transitioned to ticagrelor , clopidogrel  genetic testing in process. Developed a GIB. Last bloody bowel movement 9/10. PMH of Afib, HF. Pharmacy consulted for heparin  management.  Heparin  level < 0.1 is subtherapeutic with heparin  running at 750 units/hr. Hgb (8.9) is down slightly. PLTs (502) are stable. Per RN, no report of pauses, issues with the line, or signs  of bleeding. Bowel movements with no blood or dark tarry stool. Holding off on heparin  titration until repeat CBC this afternoon, if stable then will target full-dose heparin  per AHF MD.   9/18 PM update: Hemoglobin 10.7, updated plan to try and get to therapeutic on heparin  now. Plan to increase dose and check level in 8 hours.  Goal of Therapy:  Heparin  level 0.3-0.5 units/ml Monitor platelets by anticoagulation protocol: Yes   Plan:  Increase heparin  to 900 units/hr. Heparin  level in 8 hours. Monitor daily heparin  level, CBC, and signs/symptoms of bleeding.  Larraine Brazier, PharmD Clinical Pharmacist 02/10/2024  4:27 PM **Pharmacist phone directory can now be found on [email or web address] ([email or web address]).  Listed under [facility].

## 2024-02-10 NOTE — Progress Notes (Signed)
 PROGRESS NOTE    Chase Combs  FMW:[id number] DOB: 06/17/1945 DOA: 01/22/2024 PCP: Wonda Worth SQUIBB, PA    Brief Narrative:  78 year old with history of ischemic cardiomyopathy/LAD with PCI on 8/21, CHF, A-fib admitted to the hospital for nausea vomiting and CT suggestive of ischemic colitis, pneumatosis intestinalis/ileus.  General surgery recommended conservative management.  During hospitalization required PRBC transfusion due to blood loss anemia.  There was concerns of in-stent thrombosis from recent PCI therefore antiplatelets were switched from Cangrelor  to Plavix  and started on milrinone  drip due to cardiogenic shock.  Positive inotropes and diuretics were adjusted by CHF team.  EGD and flexible sigmoidoscopy with finding of red blood in the colon but no active bleeding, scattered diverticulosis. Grade C esophagitis, 1 superficial esophageal ulcer with no bleeding and a few erosions in the duodenum.  Recommended antiplatelet therapy and outpatient follow-up for colonoscopy. Slowly weaned off TPN, diet as tolerated.  GI recommending conservative management for colonic pseudoobstruction.  Cardiology managing CHF and atrial fibrillation.   Assessment & Plan:  Recent NSTEMI with acute in-stent thrombosis, CAD Acute congestive heart failure with reduced EF, ischemic cardiomyopathy, EF 20% Cardiogenic shock -Patient has been seen by cardiology and CHF team.  Ongoing antiplatelet management along with diabetes per their service.  Transition to p.o. torsemide   Atrial fibrillation RVR; slow improvement.  -Appears to be rate controlled at this time.  Management per cardiology team  Intestinal pneumatosis/colonic adynamic ileus Pseudoobstruction -Initial concerns of septic shock as well.  Treated with vancomycin  and thereafter transition to Zosyn  > Rocephin /Flagyl .  Completed antibiotic course on 9/10.  On Reglan  every 8 hours.  Seen by GI and general surgery who is recommending conservative  management.  May end up requiring neostigmine .  Repeat x-ray shows stable transverse colon dilatation at 9.3 cm. Discussed with [facility] -Optimize electrolytes, ambulation and frequent repositioning - Patient has been weaned off TPN  Metabolic acidosis Mild Renal Insuff, Likely prerenal - Multiple abnormalities including elevated anion gap, hypokalemia, hyponatremia, hyperglycemia.  Will repeat stat labs, lactic acid and VBG  Hypokalemia - Aggressive repletion  Acute blood loss anemia/GI bleed -EGD and flexible sigmoidoscopy with finding of red blood in the colon but no active bleeding, scattered diverticulosis. Grade C esophagitis, 1 superficial esophageal ulcer with no bleeding and a few erosions in the duodenum.  Recommended antiplatelet therapy and outpatient follow-up for colonoscopy. -PPI twice daily -Received total of 4 units of PRBC.  Hemoglobin now stable around 10.0.  Deconditioning -PT/OT-CIR, appreciate their input  DVT prophylaxis: SCDs  DNR/DNI Family Communication:  Status is: Inpatient Remains inpatient appropriate because: Ongoing multiple medical issues   PT Follow up Recs: Acute Inpatient Rehab (3hours/Day)02/05/2024 1400  Subjective: Seen at bedside, does not any complaints at this time Resting comfortably  Examination:  General exam: Appears calm and comfortable  Respiratory system: Clear to auscultation. Respiratory effort normal. Cardiovascular system: S1 & S2 heard, RRR. No JVD, murmurs, rubs, gallops or clicks. No pedal edema. Gastrointestinal system: Diminished bowel sounds, Distended abd Central nervous system: Alert and oriented. No focal neurological deficits. Extremities: Symmetric 5 x 5 power. Skin: No rashes, lesions or ulcers Psychiatry: Judgement and insight appear normal. Mood & affect appropriate. PICC line in place in the left upper extremity           Wound 02/01/24 1600 Pressure Injury Buttocks Stage 2 -  Partial thickness loss of  dermis presenting as a shallow open injury with a red, pink wound bed without slough. (Active)  Diet Orders (From admission, onward)     Start     Ordered   02/05/24 0959  DIET DYS 3 Room service appropriate? Yes; Fluid consistency: Thin  Diet effective now       Question Answer Comment  Room service appropriate? Yes   Fluid consistency: Thin      02/05/24 0958            Objective: Vitals:   02/09/24 2300 02/10/24 0404 02/10/24 0422 02/10/24 0820  BP: 105/61 95/73  (!) 97/59  Pulse: 88 88 88   Resp: 19 18 13  (!) 24  Temp: 97.9 F (36.6 C) 97.9 F (36.6 C)  97.8 F (36.6 C)  TempSrc: Oral Oral  Oral  SpO2: 100% 99% 100% 97%  Weight:  83.3 kg    Height:        Intake/Output Summary (Last 24 hours) at 02/10/2024 1121 Last data filed at 02/10/2024 0911 Gross per 24 hour  Intake 1243.43 ml  Output 1000 ml  Net 243.43 ml   Filed Weights   02/08/24 0640 02/09/24 0353 02/10/24 0404  Weight: 85.7 kg 83.2 kg 83.3 kg    Scheduled Meds:  Chlorhexidine  Gluconate Cloth  6 each Topical Q0600   digoxin   0.0625 mg Oral Daily   famotidine   20 mg Oral Daily   feeding supplement  237 mL Oral TID BM   Gerhardt's butt cream   Topical BID   Influenza vac split trivalent PF  0.5 mL Intramuscular Tomorrow-1000   insulin  aspart  0-5 Units Subcutaneous QHS   insulin  aspart  0-9 Units Subcutaneous TID WC   melatonin  5 mg Oral QHS   metoCLOPramide  (REGLAN ) injection  5 mg Intravenous Q8H   multivitamin with minerals  1 tablet Oral Daily   nystatin    Topical BID   pantoprazole  (PROTONIX ) IV  40 mg Intravenous Q12H   pneumococcal 20-valent conjugate vaccine  0.5 mL Intramuscular Tomorrow-1000   sodium chloride  flush  10-40 mL Intracatheter Q12H   sodium chloride  flush  3 mL Intravenous Q12H   spironolactone   25 mg Oral Daily   ticagrelor   90 mg Oral BID   torsemide   20 mg Oral Daily   traZODone   50 mg Oral QHS   Continuous Infusions:  amiodarone  30 mg/hr (02/10/24 0210)    heparin  750 Units/hr (02/09/24 2227)   milrinone  0.125 mcg/kg/min (02/10/24 0209)    Nutritional status Signs/Symptoms: energy intake < 75% for > 7 days, moderate muscle depletion, mild muscle depletion Interventions: Boost Breeze, MVI, Refer to RD note for recommendations Body mass index is 24.9 kg/m.  Data Reviewed:   CBC: Recent Labs  Lab 02/06/24 0413 02/07/24 0445 02/08/24 0832 02/09/24 0540 02/10/24 0709  WBC 6.5 7.7 5.7 5.8 4.5  HGB 9.4* 10.1* 10.2* 10.0* 8.9*  HCT 28.8* 31.2* 31.8* 31.9* 28.1*  MCV 86.0 86.4 87.1 88.1 88.4  PLT 460* 536* 530* 543* 502*   Basic Metabolic Panel: Recent Labs  Lab 02/05/24 0835 02/05/24 1120 02/06/24 0413 02/07/24 0445 02/08/24 0832 02/08/24 1539 02/09/24 0540 02/10/24 0709  NA 129* 131* 136 137 134* 134* 136 125*  K 3.7 3.8 3.6 4.1 3.4* 4.0 2.8* 2.8*  CL 92* 93* 91* 88* 85* 86* 87* 83*  CO2 26 24 31  33* 35* 32 29 22  GLUCOSE 322* 123* 153* 204* 175* 225* 104* 662*  BUN 43* 46* 47* 52* 48* 48* 45* 34*  CREATININE 1.20 1.20 1.23 1.25* 1.51* 1.62* 1.54* 1.21  CALCIUM  8.2* 8.7* 8.7*  8.7* 8.3* 8.0* 8.3* 7.0*  MG  --  2.0 1.9 2.1 2.2  --  2.1 2.0  PHOS 3.8 4.0 3.8 4.0 6.2*  --   --   --    GFR: Estimated Creatinine Clearance: 57 mL/min (by C-G formula based on SCr of 1.21 mg/dL). Liver Function Tests: Recent Labs  Lab 02/06/24 0413 02/07/24 0445 02/08/24 0832 02/09/24 0540 02/10/24 0709  AST 35 30 26 22 17   ALT 31 32 29 27 21   ALKPHOS 64 71 76 77 68  BILITOT 0.5 0.5 0.6 0.6 0.5  PROT 5.4* 5.6* 5.7* 5.7* 4.8*  ALBUMIN 2.3* 2.4* 2.5* 2.5* 2.2*   No results for input(s): LIPASE, AMYLASE in the last 168 hours. No results for input(s): AMMONIA in the last 168 hours. Coagulation Profile: No results for input(s): INR, PROTIME in the last 168 hours. Cardiac Enzymes: No results for input(s): CKTOTAL, CKMB, CKMBINDEX, TROPONINI in the last 168 hours. BNP (last 3 results) Recent Labs    01/07/24 2301   PROBNP 15,245.0*   HbA1C: No results for input(s): HGBA1C in the last 72 hours. CBG: Recent Labs  Lab 02/09/24 0831 02/09/24 1103 02/09/24 1636 02/09/24 2007 02/10/24 0553  GLUCAP 205* 92 228* 117* 133*   Lipid Profile: No results for input(s): CHOL, HDL, LDLCALC, TRIG, CHOLHDL, LDLDIRECT in the last 72 hours. Thyroid  Function Tests: No results for input(s): TSH, T4TOTAL, FREET4, T3FREE, THYROIDAB in the last 72 hours. Anemia Panel: No results for input(s): VITAMINB12, FOLATE, FERRITIN, TIBC, IRON, RETICCTPCT in the last 72 hours. Sepsis Labs: Recent Labs  Lab 02/09/24 0909 02/09/24 1353  LATICACIDVEN 1.8 1.8    No results found for this or any previous visit (from the past 240 hours).       Radiology Studies: DG Abd 1 View Result Date: 02/10/2024 CLINICAL DATA:  Abdominal distension. EXAM: ABDOMEN - 1 VIEW COMPARISON:  February 07, 2024. FINDINGS: Stable colonic distention is noted. Transverse colon measures 9.3 cm in diameter. No small bowel dilatation is noted. IMPRESSION: Stable colonic distention is noted. Electronically Signed   By: Lynwood Landy Raddle M.D.   On: 02/10/2024 10:49   DG CHEST PORT 1 VIEW Result Date: 02/08/2024 CLINICAL DATA:  Status post PICC placement. EXAM: PORTABLE CHEST 1 VIEW COMPARISON:  Earlier today, 1-1/2 hours prior. FINDINGS: Left upper extremity PICC in place. The previous distal coil is different in position, likely facing anteriorly or posteriorly, but still appears angulated. Tip is in the region of the upper SVC. Unchanged bibasilar atelectasis. Stable heart size and mediastinal contours. No pneumothorax. IMPRESSION: Left upper extremity PICC in place. The previous distal coil is different in position, likely facing anteriorly or posteriorly, but still appears angulated. Tip is in the region of the upper SVC. Electronically Signed   By: Andrea Gasman M.D.   On: 02/08/2024 16:43   DG CHEST PORT 1  VIEW Result Date: 02/08/2024 CLINICAL DATA:  PICC line placement EXAM: PORTABLE CHEST 1 VIEW COMPARISON:  01/24/2024 FINDINGS: Left PICC line in place. The tip coils, possibly entering the azygous vein. Bibasilar atelectasis. No effusions. Heart mediastinal contours within normal limits. IMPRESSION: PICC line tip coils in the region of the SVC, possibly in the azygous vein. Bibasilar atelectasis. Electronically Signed   By: Franky Crease M.D.   On: 02/08/2024 13:03           LOS: 19 days   Time spent= 35 mins    Shoichi Mielke JAYSON Dare, MD [facility]  If 7PM-7AM, please contact  night-coverage  02/10/2024, 11:21 AM

## 2024-02-11 ENCOUNTER — Telehealth (HOSPITAL_COMMUNITY): Payer: Self-pay | Admitting: Pharmacy Technician

## 2024-02-11 ENCOUNTER — Other Ambulatory Visit (HOSPITAL_COMMUNITY): Payer: Self-pay

## 2024-02-11 DIAGNOSIS — I5023 Acute on chronic systolic (congestive) heart failure: Secondary | ICD-10-CM | POA: Diagnosis not present

## 2024-02-11 DIAGNOSIS — E1169 Type 2 diabetes mellitus with other specified complication: Secondary | ICD-10-CM

## 2024-02-11 DIAGNOSIS — K559 Vascular disorder of intestine, unspecified: Secondary | ICD-10-CM | POA: Diagnosis not present

## 2024-02-11 DIAGNOSIS — I2510 Atherosclerotic heart disease of native coronary artery without angina pectoris: Secondary | ICD-10-CM | POA: Diagnosis not present

## 2024-02-11 DIAGNOSIS — R570 Cardiogenic shock: Secondary | ICD-10-CM | POA: Diagnosis not present

## 2024-02-11 DIAGNOSIS — K5981 Ogilvie syndrome: Secondary | ICD-10-CM | POA: Diagnosis not present

## 2024-02-11 DIAGNOSIS — K5989 Other specified functional intestinal disorders: Secondary | ICD-10-CM | POA: Diagnosis not present

## 2024-02-11 DIAGNOSIS — I10 Essential (primary) hypertension: Secondary | ICD-10-CM | POA: Diagnosis not present

## 2024-02-11 DIAGNOSIS — E119 Type 2 diabetes mellitus without complications: Secondary | ICD-10-CM

## 2024-02-11 DIAGNOSIS — N179 Acute kidney failure, unspecified: Secondary | ICD-10-CM | POA: Diagnosis present

## 2024-02-11 DIAGNOSIS — E785 Hyperlipidemia, unspecified: Secondary | ICD-10-CM

## 2024-02-11 DIAGNOSIS — I4891 Unspecified atrial fibrillation: Secondary | ICD-10-CM | POA: Diagnosis not present

## 2024-02-11 LAB — CBC
HCT: 33.7 % — ABNORMAL LOW (ref 39.0–52.0)
Hemoglobin: 10.6 g/dL — ABNORMAL LOW (ref 13.0–17.0)
MCH: 27.8 pg (ref 26.0–34.0)
MCHC: 31.5 g/dL (ref 30.0–36.0)
MCV: 88.5 fL (ref 80.0–100.0)
Platelets: 614 K/uL — ABNORMAL HIGH (ref 150–400)
RBC: 3.81 MIL/uL — ABNORMAL LOW (ref 4.22–5.81)
RDW: 19.4 % — ABNORMAL HIGH (ref 11.5–15.5)
WBC: 5.3 K/uL (ref 4.0–10.5)
nRBC: 0 % (ref 0.0–0.2)

## 2024-02-11 LAB — CALCIUM, IONIZED: Calcium, Ionized, Serum: 4.8 mg/dL (ref 4.5–5.6)

## 2024-02-11 LAB — HEPARIN LEVEL (UNFRACTIONATED)
Heparin Unfractionated: 0.12 [IU]/mL — ABNORMAL LOW (ref 0.30–0.70)
Heparin Unfractionated: 0.16 [IU]/mL — ABNORMAL LOW (ref 0.30–0.70)
Heparin Unfractionated: 0.3 [IU]/mL (ref 0.30–0.70)

## 2024-02-11 LAB — COMPREHENSIVE METABOLIC PANEL WITH GFR
ALT: 23 U/L (ref 0–44)
AST: 21 U/L (ref 15–41)
Albumin: 2.7 g/dL — ABNORMAL LOW (ref 3.5–5.0)
Alkaline Phosphatase: 78 U/L (ref 38–126)
Anion gap: 14 (ref 5–15)
BUN: 31 mg/dL — ABNORMAL HIGH (ref 8–23)
CO2: 22 mmol/L (ref 22–32)
Calcium: 8.6 mg/dL — ABNORMAL LOW (ref 8.9–10.3)
Chloride: 99 mmol/L (ref 98–111)
Creatinine, Ser: 1.06 mg/dL (ref 0.61–1.24)
GFR, Estimated: >60 mL/min (ref >60)
Glucose, Bld: 143 mg/dL — ABNORMAL HIGH (ref 70–99)
Potassium: 3.8 mmol/L (ref 3.5–5.1)
Sodium: 135 mmol/L (ref 135–145)
Total Bilirubin: 0.4 mg/dL (ref 0.0–1.2)
Total Protein: 6 g/dL — ABNORMAL LOW (ref 6.5–8.1)

## 2024-02-11 LAB — GLUCOSE, CAPILLARY
Glucose-Capillary: 117 mg/dL — ABNORMAL HIGH (ref 70–99)
Glucose-Capillary: 184 mg/dL — ABNORMAL HIGH (ref 70–99)
Glucose-Capillary: 158 mg/dL — ABNORMAL HIGH (ref 70–99)
Glucose-Capillary: 114 mg/dL — ABNORMAL HIGH (ref 70–99)

## 2024-02-11 LAB — PHOSPHORUS: Phosphorus: 3.2 mg/dL (ref 2.5–4.6)

## 2024-02-11 LAB — MAGNESIUM: Magnesium: 2.4 mg/dL (ref 1.7–2.4)

## 2024-02-11 LAB — COOXEMETRY PANEL
Carboxyhemoglobin: 1.3 % (ref 0.5–1.5)
Methemoglobin: <0.7 % (ref 0.0–1.5)
O2 Saturation: 55.6 %
Total hemoglobin: 11.2 g/dL — ABNORMAL LOW (ref 12.0–16.0)

## 2024-02-11 MED ORDER — RIFAXIMIN 550 MG PO TABS
550.0000 mg | ORAL_TABLET | Freq: Three times a day (TID) | ORAL | Status: AC
  Administered 2024-02-11 – 2024-02-24 (×38): 550 mg via ORAL
  Filled 2024-02-11 (×42): qty 1

## 2024-02-11 MED ORDER — SODIUM CHLORIDE (PF) 0.9 % IJ SOLN
INTRAMUSCULAR | Status: AC
  Filled 2024-02-11: qty 10

## 2024-02-11 MED ORDER — AMIODARONE HCL 200 MG PO TABS
400.0000 mg | ORAL_TABLET | Freq: Every day | ORAL | Status: DC
  Administered 2024-02-11 – 2024-02-14 (×4): 400 mg via ORAL
  Filled 2024-02-11 (×4): qty 2

## 2024-02-11 MED ORDER — PANTOPRAZOLE SODIUM 40 MG PO TBEC
40.0000 mg | DELAYED_RELEASE_TABLET | Freq: Two times a day (BID) | ORAL | Status: DC
  Administered 2024-02-11 – 2024-02-25 (×29): 40 mg via ORAL
  Filled 2024-02-11 (×29): qty 1

## 2024-02-11 MED ORDER — POTASSIUM CHLORIDE 20 MEQ PO PACK
20.0000 meq | PACK | Freq: Once | ORAL | Status: AC
  Administered 2024-02-11: 20 meq via ORAL
  Filled 2024-02-11: qty 1

## 2024-02-11 NOTE — Telephone Encounter (Signed)
 Pharmacy Patient Advocate Encounter  Insurance verification completed.    The patient is insured through [facility].     Ran test claim for Xifaxan  550mg  tablets and the drug requires a prior authorization.   This test claim was processed through [facility]- copay amounts may vary at other pharmacies due to pharmacy/plan contracts, or as the patient moves through the different stages of their insurance plan.

## 2024-02-11 NOTE — Assessment & Plan Note (Deleted)
 Nutrition Status: Nutrition Problem: Moderate Malnutrition Etiology: acute illness Signs/Symptoms: energy intake < 75% for > 7 days, moderate muscle depletion, mild muscle depletion Interventions: Boost Breeze, MVI, Refer to RD note for recommendations

## 2024-02-11 NOTE — Assessment & Plan Note (Deleted)
 01-22-2024 to 01-29-2024.  admitted to ICU by PCCM. General surgery consulted. CT abd showed pneumatosis. Surgery did not feel that pt was a operative candidate. IV abx started. Pt developed gastric distension and ileus.  Due to N/V and ileus, NG tube was placed for gastric decompression. By 01-26-2024, abd distension improved, NG tube removed and clear liquid diet started.

## 2024-02-11 NOTE — Assessment & Plan Note (Deleted)
 02-17-2024 unable to start HTN meds due to low BP  02/18/24 BP still borderline low due to rapid afib.  02/19/24 BP low today. Cannot be on any further HTN meds due to low BP.  02/20/24 BP too low to add any additional meds. Maybe if he is back in NSR, his BP will increase  02/21/24 unable to add any additional HTN meds due to low BP.

## 2024-02-11 NOTE — Assessment & Plan Note (Deleted)
 02-16-2024 CBG stable without SSI  02/17/24 CBG stable  02/18/24 stable CBG  02/19/24 CBG stable.  02/20/24 stable  02/21/24 stable

## 2024-02-11 NOTE — Progress Notes (Addendum)
 Advanced Heart Failure Rounding Note  Cardiologist: Alm Clay, MD  HF Consulting Cardiologist: Dr. Zenaida  Chief Complaint: Acute on chronic HFrEF  Subjective:   9/9 PTCA LAD d/t stent thrombosis. A fib w/ RVR. CO-OX 44 %. Started on milrinone  0.25 mcg.  9/10:  EGD and flex sig 9/10.  EGD with 1 superficial esophageal ulcer, LA grade C reflux esophagitis with no bleeding. Flex sig: Red blood in the rectum, sigmoid colon, descending colon and transverse colon. Few diverticula found. No obvious source of bleeding.  9/18 Milrinone  stopped.     Denies SOB. Having 2-3 BM per day.    Objective:   Weight Range: 77.9 kg Body mass index is 23.29 kg/m.   Vital Signs:   Temp:  [97.7 F (36.5 C)-97.9 F (36.6 C)] 97.9 F (36.6 C) (09/19 0737) Pulse Rate:  [91-98] 94 (09/19 0737) Resp:  [15-24] 21 (09/19 0737) BP: (107-130)/(71-97) 113/77 (09/19 0737) SpO2:  [92 %-100 %] 98 % (09/19 0737) Weight:  [77.9 kg] 77.9 kg (09/19 0336) Last BM Date : 02/11/24  Weight change: Filed Weights   02/09/24 0353 02/10/24 0404 02/11/24 0336  Weight: 83.2 kg 83.3 kg 77.9 kg    Intake/Output:   Intake/Output Summary (Last 24 hours) at 02/11/2024 1030 Last data filed at 02/11/2024 0500 Gross per 24 hour  Intake 1296.09 ml  Output 1200 ml  Net 96.09 ml    CVP 3-5  Physical Exam  General:   No resp difficulty Neck: no JVD.  Cor: Irregular rate & rhythm.  Lungs: clear Abdomen: soft, nontender, distended.  Extremities: no  edema Neuro: alert & oriented x3  Telemetry   Afib 80s    Labs    CBC Recent Labs    02/10/24 1504 02/11/24 0500  WBC 5.4 5.3  HGB 10.7* 10.6*  HCT 33.4* 33.7*  MCV 87.7 88.5  PLT 605* 614*   Basic Metabolic Panel Recent Labs    90/81/74 0709 02/10/24 1130 02/11/24 0500  NA 125* 133* 135  K 2.8* 3.1* 3.8  CL 83* 94* 99  CO2 22 27 22   GLUCOSE 662* 227* 143*  BUN 34* 36* 31*  CREATININE 1.21 1.24 1.06  CALCIUM  7.0* 8.4* 8.6*  MG 2.0  --   2.4  PHOS  --   --  3.2   Liver Function Tests Recent Labs    02/10/24 0709 02/11/24 0500  AST 17 21  ALT 21 23  ALKPHOS 68 78  BILITOT 0.5 0.4  PROT 4.8* 6.0*  ALBUMIN 2.2* 2.7*    Medications:   Scheduled Medications:  Chlorhexidine  Gluconate Cloth  6 each Topical Q0600   digoxin   0.0625 mg Oral Daily   famotidine   20 mg Oral Daily   feeding supplement  237 mL Oral TID BM   Gerhardt's butt cream   Topical BID   Influenza vac split trivalent PF  0.5 mL Intramuscular Tomorrow-1000   insulin  aspart  0-5 Units Subcutaneous QHS   insulin  aspart  0-9 Units Subcutaneous TID WC   melatonin  5 mg Oral QHS   multivitamin with minerals  1 tablet Oral Daily   nystatin    Topical BID   pantoprazole  (PROTONIX ) IV  40 mg Intravenous Q12H   pneumococcal 20-valent conjugate vaccine  0.5 mL Intramuscular Tomorrow-1000   potassium chloride   20 mEq Oral Once   rifaximin   550 mg Oral TID   sodium chloride  flush  10-40 mL Intracatheter Q12H   sodium chloride  flush  3 mL  Intravenous Q12H   spironolactone   25 mg Oral Daily   ticagrelor   90 mg Oral BID   torsemide   20 mg Oral Daily   traZODone   50 mg Oral QHS    Infusions:  amiodarone  30 mg/hr (02/11/24 0500)   heparin  1,050 Units/hr (02/11/24 0500)    PRN Medications: acetaminophen , ALPRAZolam , HYDROmorphone  (DILAUDID ) injection, ipratropium-albuterol , ondansetron  (ZOFRAN ) IV, mouth rinse, polyethylene glycol, simethicone , sodium chloride  flush, sodium chloride  flush  Patient Profile  Chase Combs is a 78 year old with a history of ICM, chronic biventricular HFrEF, CAD s/p PCI to LAD in 08/25.[name]      Readmitted 01/22/24 with abdominal pain/nausea/vomiting. Hospital course complicated by ischemic colitis w/ septic shock/GI bleed, stent thrombosis involving recent LAD stent and low-output HF.   Assessment/Plan  CAD, recent STEMI d/t stent thrombosis - 01/17/24- PCI/DES LAD  - 02/01/24 Cangrelor  stopped given Plavix  load and ST elevation.  Acute stent thrombosis involving LAD stent treated with PTCA. - Plavix  Genetic Testing collected, pending -No chest pain.  - Continue ticagrelor  90mg  BID.  - Off aspirin  with GI bleed.  - Continue high-intensity statin.   New A fib RVR - Continue amio drip at 30 mg/hr while on milrinone , rate controlled.  - Hgb down slightly.  Cancel TEE/DC-CV for now given GI issues. Stop amio drip. Start amio 400 mg daily. .  - Continue SCDs.  - Continue digoxin  0.0625   A/C HFrEF, ICM   -01/13/24 Echo EF  20-25% RV moderately reduced.   -02/01/24 Echo LVEF 20-25% RV moderately reduced.    - CO-OX 53% with Fick CI 2.2 on 0.125 milrinone . Recheck CO-OX. If > 55%, will plan to stop milrinone . - CVP stable. Continue Torsemide  20 mg daily - Off SGLT2i with fungal rash.  - Continue spiro 25 mg daily - Continue digoxin  0.0625 mcg daily. Dig level < 0.6. - No BP room to further titrate GDMT - Renal function stable.    Anemia in the setting of GI Bleed -Transfused 9/8 with appropriate rise.  - 9/10 he had large bloody BM. Unfortunately given 600 mg Ibuprofen .  - Protonix  and Pepcid  per GI - GI following.  - S/p EGD and flex sig.  EGD with 1 superficial esophageal ulcer, LA grade C reflux esophagitis with no bleeding. Flex sig: Red blood in the rectum, sigmoid colon, descending colon and transverse colon. Few diverticula found. No obvious source of bleeding, possibly diverticular per GI. - Hgb stable.   Intestinal pneumatosis w/ concern for ischemic colitis Ileus Psuedoobstruction - Gen surgery recommended conservative management - Treated with antibiotics for suspected septic shock. - Abdomen distended. Transverse colon measures 9.3 cm  -GI following.   Hypokalemia - K 3.8    Code status: DNR   Continue to mobilize.     Length of Stay: 20  Prakash Kimberling, NP  02/11/2024, 10:30 AM  Advanced Heart Failure Team Pager [phone] (M-F; 7a - 5p)  Please contact [facility] for night-coverage  after hours (5p -7a ) and weekends on [email or web address]

## 2024-02-11 NOTE — Assessment & Plan Note (Deleted)
 02-02-2024 pt develops cardiogenic shock after STEMI requiring PTCA. Pt placed on milrinone  for cardiovascular support.  02-04-2024 through 02-09-2024 pt started on IV lasix  gtts for diuresis while he has cardiogenic shock to help remove intravascular volume  02-14-2024 po demadex  and aldactone  started.  02/17/24 stable on po demadex  and aldactone . Scr 1.4-1.5 over the last 4-5 days.  02/18/24 stable. On demadex  and aldactone .  02/19/24 Scr up to 1.59. cards wants to continue demadex /aldactone  despite rising Scr.  02/20/24 Scr 1.39, BUN 34 today. Pt received multiple runs of IV KCL and IV Mg yesterday which were given with carrier  IVF. Cards wants to continue demadex /aldactone .  02/21/24 Scr at 1.55 today. Consistent with mild AKI. Cards continues to want demadex /aldactone .  02/21/24 AKI persistent with Scr 1.55. management of GDMT per cards.  02/22/24 Scr 1.37, BUN 25, stable. Remains on demadex /aldactone . Cards to decide on GDMT. Right now, BP still too low to add any additional meds. Remains on digoxin .

## 2024-02-11 NOTE — Assessment & Plan Note (Addendum)
 Intestinal pneumatosis/ colonic adynamic ileus  Pseudo obstruction.  Complicated with sepsis, present on admission.  Completed antibiotic therapy on 09/10 TPN has been weaned off.   Trial of rifaximin  550 mg po tid.  Continue supportive care. Tolerating po well.  Repeating CT abdomen and pelvis today.

## 2024-02-11 NOTE — Progress Notes (Signed)
 PHARMACY - ANTICOAGULATION CONSULT NOTE  Pharmacy Consult for heparin  infusion Indication: atrial fibrillation  Allergies  Allergen Reactions   Codeine Nausea And Vomiting   Latex Rash    Oral blisters when used at dentist    Patient Measurements: Height: 6' (182.9 cm) Weight: 77.9 kg (171 lb 11.8 oz) IBW/kg (Calculated) : 77.6 HEPARIN  DW (KG): 91.1  Vital Signs: Temp: 97.9 F (36.6 C) (09/19 1100) Temp Source: Oral (09/19 1100) BP: 117/84 (09/19 1100) Pulse Rate: 97 (09/19 1203)  Labs: Recent Labs    02/10/24 0709 02/10/24 0710 02/10/24 1130 02/10/24 1504 02/11/24 0100 02/11/24 0500 02/11/24 1227  HGB 8.9*  --   --  10.7*  --  10.6*  --   HCT 28.1*  --   --  33.4*  --  33.7*  --   PLT 502*  --   --  605*  --  614*  --   HEPARINUNFRC  --  <0.10*  --   --  0.12*  --  0.16*  CREATININE 1.21  --  1.24  --   --  1.06  --     Estimated Creatinine Clearance: 65.1 mL/min (by C-G formula based on SCr of 1.06 mg/dL).   Medical History: Past Medical History:  Diagnosis Date   Abdominal hernia    per pt   Arthritis    hands   Benign localized prostatic hyperplasia with lower urinary tract symptoms (LUTS)    CAD (coronary artery disease) 05/2004   cardiologist--- dr anner;  positive stress test , had outpt cardiac cath 05-27-2004 stenosis RI;  06-18-2004 cath w/ PCI and BMS x1 to pRI;   STEMI 02-16-2009  s/p cath w/ PCI thrombectomy for total occluded RCA , BMS x1 to pRCA;  nuclear stress test 03-15-2012 low risk no ischemia but sig inferior-inferolateral infarct , ef 51%   Dyslipidemia    Essential hypertension    Heart murmur    History of COVID-19 05/2020   per pt mild symptoms that resolved   History of ST elevation myocardial infarction (STEMI) 02/16/2009   inferior STEMI   cath w/ pci w/ BMS to pRCA   History of urinary retention    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[facility] [place]) 04/2021   urologist--- dr borden/ radiation oncology-- dr patrcia;  dx 12/ 2022,   Gleason 4+5, PSA 24.6, volume 94.6cc;  plan to start IMRT   OSA (obstructive sleep apnea)    per pt dx approx 2008, no cpap intolerant   PONV (postoperative nausea and vomiting)    Pre-diabetes    S/p bare metal coronary artery stent    01/ 2006  x1 BMS to pRI (CoStar study stent);  and 09/ 2010  x1 BMS to pRCA    Medications:  Infusions:   heparin  1,050 Units/hr (02/11/24 0500)    Assessment: 77 yo M who presented with chest pain. Recently had coronary stent placed (August 2025), found to have in stent thrombosis. Thought to be clopidogrel  failure, transitioned to ticagrelor , clopidogrel  genetic testing in process. Developed a GIB. Last bloody bowel movement 9/10. PMH of Afib, HF. Pharmacy consulted for heparin  management.  Heparin  level 0.16 is below goal on heparin  1050 units/hr.  Hgb 10.6 (improved from 8.9 yesterday), pltc 614 stable.  Last PRBC 9/10; 4u PRBC total this admission.  Now that Hgb is stable, OK to target full-dose heparin  per AHF MD.  No plans for DCCV yet until bowel issues resolve - tentative next week.   Goal of Therapy:  Heparin  level 0.3-0.5 units/ml Monitor platelets by anticoagulation protocol: Yes   Plan:  Increase heparin  to 1200 units/h 8h heparin  level Monitor daily heparin  level, CBC, and signs/symptoms of bleeding. Follow plans for DCCV  Maurilio Fila, PharmD Clinical Pharmacist 02/11/2024  1:48 PM

## 2024-02-11 NOTE — Progress Notes (Signed)
 Progress Note   Patient: Chase Combs FMW:[id number] DOB: 1945/08/04 DOA: 01/22/2024     20 DOS: the patient was seen and examined on 02/11/2024   Brief hospital course: [name] was admitted to the hospital with the working diagnosis of ileitis/ ischemic colitis.   78 year old with history of ischemic cardiomyopathy/LAD with PCI on 8/21, CHF, A-fib admitted to the hospital for nausea vomiting and CT suggestive of ischemic colitis, pneumatosis intestinalis/ileus.   General surgery recommended conservative management.  During hospitalization required PRBC transfusion due to blood loss anemia.   There was concerns of in-stent thrombosis from recent PCI therefore antiplatelets were switched from Cangrelor  to Plavix  and started on milrinone  drip due to cardiogenic shock.    EGD and flexible sigmoidoscopy with finding of red blood in the colon but no active bleeding, scattered diverticulosis. Grade C esophagitis, 1 superficial esophageal ulcer with no bleeding and a few erosions in the duodenum.   Recommended antiplatelet therapy and outpatient follow-up for colonoscopy. Slowly weaned off TPN, diet as tolerated.   GI recommending conservative management for colonic pseudoobstruction.  Cardiology managing CHF and atrial fibrillation.  Assessment and Plan: * Intestinal ischemia (HCC) Intestinal pneumatosis/ colonic adynamic ileus  Pseudo obstruction.  Complicated with sepsis, present on admission.  Completed antibiotic therapy on 09/10 TPN has been weaned off.   Trial of rifaximin  550 mg po tid.   Acute on chronic systolic CHF (congestive heart failure) (HCC) Echocardiogram with reduced LV systolic function EF 20 to 25%, global hypokinesis, moderate LVH, diastolic dysfunction with EA fusion,  RV systolic function moderately reduced, mild RV enlargement, RVSP 30.7 mmHg, moderate mitral valve regurgitation, mild to moderate TR,   Urine output is 1200 ml Systolic blood pressure 110 mmHg  range  Off milrinone   Medical therapy with digoxin  and spironolactone . Diuresis with torsemide .   Coronary artery disease involving native coronary artery of native heart without angina pectoris Recent STEMI with stent thrombosis.  Continue with ticagrelor , holding asa due to GI bleeding.  Continue statin.   Essential hypertension Continue blood pressure monitoring Diuresis with torsemide  and spironolactone .   Atrial fibrillation with RVR (HCC) Rate control with amiodarone , continue anticoagulation with IV heparin  Pending cardioversion when GI status has improved.   AKI (acute kidney injury) (HCC) Hyponatremia, hypokalemia.   Renal function with serum cr 1,0 Na 135, K 3.8 with serum bicarbonate 22  Na 135   Continue close follow up renal function and electrolytes Continue diuresis with torsemide   Anemia due to acute blood loss GI bleed.   SP 4 units PRBC transfusion.   EGD with flexible sigmoidoscopy with red blood in the colon.  Grade 1 esophagitis.  Continue pantoprazole .   Malnutrition of moderate degree Continue nutritional supplements.   Type 2 diabetes mellitus with hyperlipidemia (HCC) Continue insulin  sliding scale for glucose cover and monitoring        Subjective: Patient continue to have abdominal distention and diarrhea, dyspnea has been stable, improved edema   Physical Exam: Vitals:   02/11/24 0336 02/11/24 0737 02/11/24 1100 02/11/24 1203  BP: 107/71 113/77 117/84   Pulse: 94 94 93 97  Resp: 15 (!) 21 16   Temp: 97.7 F (36.5 C) 97.9 F (36.6 C) 97.9 F (36.6 C)   TempSrc: Oral Oral Oral   SpO2: 97% 98% 100%   Weight: 77.9 kg     Height:       Neurology awake and alert ENT with mild pallor  Cardiovascular with S1 and S2 present, irregularly  irregular with no gallops or rubs Respiratory with no rales or wheezing, no rhonchi  Abdomen mild distended, not tender No lower extremity edema Ted hose in place  Data Reviewed:    Family  Communication: I spoke with patient's wife at the bedside, we talked in detail about patient's condition, plan of care and prognosis and all questions were addressed.   Disposition: Status is: Inpatient Remains inpatient appropriate because: recovering heart failure   Planned Discharge Destination: Home     Author: Elidia Toribio Furnace, MD 02/11/2024 3:43 PM  For on call review [email or web address].

## 2024-02-11 NOTE — Assessment & Plan Note (Deleted)
 09-09--2025 pt has in-stent thrombosis causing STEMI. Taken to cath lab for [facility] to PTCA.  02-16-2024 pt on Brilinta . ASA on hold due to GI bleed on 02-02-2024.  Restart crestor  20 mg daily.  02/17/24 remains on Brilinta  and Eliquis . No ASA due to GI bleeding on 02-02-2024.  02/18/24 stable on Eliquis  and Brilinta . Cards holding on ASA due to GI bleeding on 02-02-2024  02/19/24 on Eliquis  and Brilinta . ASA on hold due to GI bleeding on 02-02-2024.  02/20/24 on Brilinta  and Eliquis . Off ASA due to GI bleeding on 02-02-2024.  02/21/24 pt's plavix  assay came back as he is a rapid metabolizer of plavix . Cards to determine how that impacts his CAD.    02/22/24 appears cardiology has loaded pt with 300 mg plavix  and then 75 mg daily plavix . Off Brilinta  now. Continues on Eliquis  and crestor . No betablocker due to low BP.

## 2024-02-11 NOTE — Progress Notes (Signed)
 PHARMACY - ANTICOAGULATION CONSULT NOTE  Pharmacy Consult for heparin  infusion Indication: atrial fibrillation  Allergies  Allergen Reactions   Codeine Nausea And Vomiting   Latex Rash    Oral blisters when used at dentist    Patient Measurements: Height: 6' (182.9 cm) Weight: 83.3 kg (183 lb 9.6 oz) IBW/kg (Calculated) : 77.6 HEPARIN  DW (KG): 91.1  Vital Signs: Temp: 97.8 F (36.6 C) (09/18 2326) Temp Source: Oral (09/18 2326) BP: 130/97 (09/18 2326) Pulse Rate: 98 (09/18 2326)  Labs: Recent Labs    02/09/24 0540 02/09/24 1900 02/09/24 2152 02/10/24 0500 02/10/24 0709 02/10/24 0710 02/10/24 1130 02/10/24 1504 02/11/24 0100  HGB 10.0*  --   --  8.8* 8.9*  --   --  10.7*  --   HCT 31.9*  --   --  28.0* 28.1*  --   --  33.4*  --   PLT 543*  --   --  503* 502*  --   --  605*  --   HEPARINUNFRC  --    < > <0.10*  --   --  <0.10*  --   --  0.12*  CREATININE 1.54*  --   --   --  1.21  --  1.24  --   --    < > = values in this interval not displayed.    Estimated Creatinine Clearance: 55.6 mL/min (by C-G formula based on SCr of 1.24 mg/dL).   Medical History: Past Medical History:  Diagnosis Date   Abdominal hernia    per pt   Arthritis    hands   Benign localized prostatic hyperplasia with lower urinary tract symptoms (LUTS)    CAD (coronary artery disease) 05/2004   cardiologist--- dr anner;  positive stress test , had outpt cardiac cath 05-27-2004 stenosis RI;  06-18-2004 cath w/ PCI and BMS x1 to pRI;   STEMI 02-16-2009  s/p cath w/ PCI thrombectomy for total occluded RCA , BMS x1 to pRCA;  nuclear stress test 03-15-2012 low risk no ischemia but sig inferior-inferolateral infarct , ef 51%   Dyslipidemia    Essential hypertension    Heart murmur    History of COVID-19 05/2020   per pt mild symptoms that resolved   History of ST elevation myocardial infarction (STEMI) 02/16/2009   inferior STEMI   cath w/ pci w/ BMS to pRCA   History of urinary  retention    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   urologist--- dr borden/ radiation oncology-- dr patrcia;  dx 12/ 2022,  Gleason 4+5, PSA 24.6, volume 94.6cc;  plan to start IMRT   OSA (obstructive sleep apnea)    per pt dx approx 2008, no cpap intolerant   PONV (postoperative nausea and vomiting)    Pre-diabetes    S/p bare metal coronary artery stent    01/ 2006  x1 BMS to pRI (CoStar study stent);  and 09/ 2010  x1 BMS to pRCA    Medications:  Scheduled:   Chlorhexidine  Gluconate Cloth  6 each Topical Q0600   digoxin   0.0625 mg Oral Daily   famotidine   20 mg Oral Daily   feeding supplement  237 mL Oral TID BM   Gerhardt's butt cream   Topical BID   Influenza vac split trivalent PF  0.5 mL Intramuscular Tomorrow-1000   insulin  aspart  0-5 Units Subcutaneous QHS   insulin  aspart  0-9 Units Subcutaneous TID WC   melatonin  5 mg Oral QHS  multivitamin with minerals  1 tablet Oral Daily   nystatin    Topical BID   pantoprazole  (PROTONIX ) IV  40 mg Intravenous Q12H   pneumococcal 20-valent conjugate vaccine  0.5 mL Intramuscular Tomorrow-1000   sodium chloride  flush  10-40 mL Intracatheter Q12H   sodium chloride  flush  3 mL Intravenous Q12H   spironolactone   25 mg Oral Daily   ticagrelor   90 mg Oral BID   torsemide   20 mg Oral Daily   traZODone   50 mg Oral QHS   Infusions:   amiodarone  30 mg/hr (02/11/24 0000)   heparin  900 Units/hr (02/11/24 0000)    Assessment: 78 yo M who presented with chest pain. Recently had coronary stent placed (August 2025), found to have in stent thrombosis. Thought to be clopidogrel  failure, transitioned to ticagrelor , clopidogrel  genetic testing in process. Developed a GIB. Last bloody bowel movement 9/10. PMH of Afib, HF. Pharmacy consulted for heparin  management.  Heparin  level < 0.1 is subtherapeutic with heparin  running at 750 units/hr. Hgb (8.9) is down slightly. PLTs (502) are stable. Per RN, no report of pauses, issues with the  line, or signs of bleeding. Bowel movements with no blood or dark tarry stool. Holding off on heparin  titration until repeat CBC this afternoon, if stable then will target full-dose heparin  per AHF MD.   Heparin  came back subtherapeutic again tonight. We will increase again and check another level in AM.   Goal of Therapy:  Heparin  level 0.3-0.5 units/ml Monitor platelets by anticoagulation protocol: Yes   Plan:  Increase heparin  to 1050 units/hr. Heparin  level in 8 hours. Monitor daily heparin  level, CBC, and signs/symptoms of bleeding.  Sergio Batch, PharmD, BCIDP, AAHIVP, CPP Infectious Disease Pharmacist 02/11/2024 2:21 AM

## 2024-02-11 NOTE — Assessment & Plan Note (Deleted)
 02-04-2024 pt started on digoxin  and IV amiodarone  for rate control as pt's blood pressure too low to start any other rate controlled meds due to cardiogenic shock.  02-05-2024 through 02-15-2024 afib rate remains uncontrolled. Due to ongoing colonic ileus, cardiology does not want to cardiovert.  02-16-2024 remains on IV amiodarone  gtts. Rate still in the mid 100s. SBP borderline low at 90-100s  02/17/24 pt remains on IV amiodarone  and eliquis . HR still uncontrolled. Cardiology managing his rapid afib.  02/18/24 on digoxin .  remains on IV amiodarone  and eliquis . Cards managing afib. Potential for DCCV after colonic issues resolved.  02/19/24 remains on IV amiodarone  gtts and eliquis . Cards thinking about possible DCCV on Monday. Attempt to keep serum K >4.0 and Mg > 2.0  02/20/24 continue IV amiodarone . With his frequent liquid Bms, having a hard time keeping up with his potassium losses. Will give more IV and po kcl today. Mg level looks ok. Cards looking to see if pt can get DCCV tomorrow. Will make him NPO after MN in case.  02/21/24 cards to decide if pt needs DCCV today. Colonic ileus seems to have resolved. Remains on Eliquis  and IV amiodarone .  02/22/24 s/p DCCV on 02-22-2024. Cardiology to decide on IV amiodarone . Remains on Eliquis  and digoxin .

## 2024-02-11 NOTE — Progress Notes (Signed)
 Occupational Therapy Treatment Patient Details Name: Chase Combs MRN: [id number] DOB: [birth year] Today's Date: 02/11/2024   History of present illness 78 year old man admitted 8/30 with progressive nausea vomiting with CT images concerning for ischemic colitis; pneumatosis intestinalis/ileus. 9/9  recurrent STEMI with in-stent stenosis.[name]  PMH: ischemic cardiomyopathy and very recent LAD stent placement for NSTEMI discharged 8/27.   OT comments  Patient continues to make good gains with OT treatment with bed mobility, self care, and functional transfers. Patient requires max assist for toilet hygiene while standing due to patient having heavy reliance on UE support. Patient will benefit from intensive inpatient follow-up therapy, >3 hours/day.  Acute OT to continue to follow to address established goals to facilitate DC to next venue of care.        If plan is discharge home, recommend the following:  A little help with walking and/or transfers;A little help with bathing/dressing/bathroom;Assistance with cooking/housework;Assist for transportation;Help with stairs or ramp for entrance   Equipment Recommendations  Other (comment) (TBD at next venue but do not think any needs at this time)    Recommendations for Other Services      Precautions / Restrictions Precautions Precautions: Fall Recall of Precautions/Restrictions: Intact Precaution/Restrictions Comments: fear of falls; not on O2 at baseline Restrictions Weight Bearing Restrictions Per Provider Order: No       Mobility Bed Mobility Overal bed mobility: Needs Assistance Bed Mobility: Supine to Sit     Supine to sit: Min assist, HOB elevated, Used rails     General bed mobility comments: assistance to raise trunk    Transfers Overall transfer level: Needs assistance Equipment used: Rolling walker (2 wheels) Transfers: Sit to/from Stand Sit to Stand: Contact guard assist, Supervision, From elevated surface            General transfer comment: patient attempts without assitance but requires CGA to power up and for safety     Balance Overall balance assessment: Needs assistance Sitting-balance support: Feet supported Sitting balance-Leahy Scale: Good     Standing balance support: Bilateral upper extremity supported, During functional activity, Reliant on assistive device for balance Standing balance-Leahy Scale: Poor Standing balance comment: heavy reliance on RW                           ADL either performed or assessed with clinical judgement   ADL Overall ADL's : Needs assistance/impaired     Grooming: Set up;Sitting               Lower Body Dressing: Contact guard assist;Minimal assistance;Sit to/from stand   Toilet Transfer: Contact guard assist;Stand-pivot;Rolling walker (2 wheels);BSC/3in1   Toileting- Clothing Manipulation and Hygiene: Maximal assistance;Sit to/from stand Toileting - Clothing Manipulation Details (indicate cue type and reason): required assistance for toilet hygiene while standing            Extremity/Trunk Assessment              Vision       Perception     Praxis     Communication Communication Communication: No apparent difficulties Factors Affecting Communication: Hearing impaired   Cognition Arousal: Alert Behavior During Therapy: WFL for tasks assessed/performed Cognition: No apparent impairments                               Following commands: Intact (HoH)        Cueing  Cueing Techniques: Verbal cues, Gestural cues  Exercises      Shoulder Instructions       General Comments      Pertinent Vitals/ Pain       Pain Assessment Pain Assessment: Faces Faces Pain Scale: Hurts a little bit Pain Location: generalized Pain Descriptors / Indicators: Discomfort Pain Intervention(s): Limited activity within patient's tolerance, Monitored during session, Repositioned  Home Living                                           Prior Functioning/Environment              Frequency  Min 2X/week        Progress Toward Goals  OT Goals(current goals can now be found in the care plan section)  Progress towards OT goals: Progressing toward goals  Acute Rehab OT Goals OT Goal Formulation: With patient Time For Goal Achievement: 02/21/24 Potential to Achieve Goals: Good ADL Goals Pt Will Perform Grooming: with modified independence;sitting Pt Will Perform Lower Body Bathing: with modified independence;with adaptive equipment;sit to/from stand Pt Will Perform Lower Body Dressing: with modified independence;with adaptive equipment;sit to/from stand;sitting/lateral leans Pt Will Transfer to Toilet: with modified independence;ambulating Pt Will Perform Toileting - Clothing Manipulation and hygiene: with supervision;sit to/from stand  Plan      Co-evaluation                 AM-PAC OT 6 Clicks Daily Activity     Outcome Measure   Help from another person eating meals?: None Help from another person taking care of personal grooming?: A Little Help from another person toileting, which includes using toliet, bedpan, or urinal?: A Little Help from another person bathing (including washing, rinsing, drying)?: A Little Help from another person to put on and taking off regular upper body clothing?: A Little Help from another person to put on and taking off regular lower body clothing?: A Little 6 Click Score: 19    End of Session Equipment Utilized During Treatment: Gait belt;Rolling walker (2 wheels)  OT Visit Diagnosis: Unsteadiness on feet (R26.81);Muscle weakness (generalized) (M62.81)   Activity Tolerance Patient tolerated treatment well   Patient Left in chair;with call bell/phone within reach;with chair alarm set;with family/visitor present   Nurse Communication Mobility status        Time: 9175-9143 OT Time Calculation (min): 32 min  Charges:  OT General Charges $OT Visit: 1 Visit OT Treatments $Self Care/Home Management : 23-37 mins  Dick Laine, OTA Acute Rehabilitation Services  Office [phone]   Jeb LITTIE Laine 02/11/2024, 11:08 AM

## 2024-02-11 NOTE — Assessment & Plan Note (Deleted)
 01-22-2024 through 01-28-2024. Admitted with AKI with Scr of 2.55. with IVF and treatment of his ileus/intestinal pneumatosis, his scr return to 1.07 by 01-28-2024  02-08-2024 through 02-11-2024 he develops another AKI during his cardiogenic shock. By 02-10-2024, scr back to 1.06  02-13-2024 scr back on the rise again after uncontrolled afib and starting of aldcatone/demadex .  02/17/24 Scr stable at 1.4 - 1.5 on demadex  and aldactone .  02/18/24 Scr stable at 1.4.  02/19/24 Scr up to 1.59. cards wants to continue demadex /aldactone  despite rising Scr.   02/20/24 Scr improved to 1.39 today. Was 1.59 yesterday.  Pt received multiple runs of IV KCL and IV Mg yesterday which were given with carrier   02/21/24 Scr 1.55 today.  His baseline Scr of 0.7-0.8. AKI cause is multifactorial from cardiogenic shock, acute CHF, rapid afib. More recently, his Scr has been uptrending with continue demadex /aldactone . Cardiology continues to recommend continued treatment with demadex /aldactone . Defer to cards on diuretic management and how it impacts his AKI.  02/22/24 Scr 1.37, BUN 25. Stable. Will likely stay at this level since inability to stop diuretics given EF of 20%.

## 2024-02-11 NOTE — Progress Notes (Signed)
 PHARMACY - ANTICOAGULATION CONSULT NOTE  Pharmacy Consult for heparin  infusion Indication: atrial fibrillation  Allergies  Allergen Reactions   Codeine Nausea And Vomiting   Latex Rash    Oral blisters when used at dentist    Patient Measurements: Height: 6' (182.9 cm) Weight: 77.9 kg (171 lb 11.8 oz) IBW/kg (Calculated) : 77.6 HEPARIN  DW (KG): 91.1  Vital Signs: Temp: 97.7 F (36.5 C) (09/19 2158) Temp Source: Oral (09/19 2158) BP: 125/76 (09/19 2158) Pulse Rate: 95 (09/19 2158)  Labs: Recent Labs    02/10/24 0709 02/10/24 0710 02/10/24 1130 02/10/24 1504 02/11/24 0100 02/11/24 0500 02/11/24 1227 02/11/24 2200  HGB 8.9*  --   --  10.7*  --  10.6*  --   --   HCT 28.1*  --   --  33.4*  --  33.7*  --   --   PLT 502*  --   --  605*  --  614*  --   --   HEPARINUNFRC  --    < >  --   --  0.12*  --  0.16* 0.30  CREATININE 1.21  --  1.24  --   --  1.06  --   --    < > = values in this interval not displayed.    Estimated Creatinine Clearance: 65.1 mL/min (by C-G formula based on SCr of 1.06 mg/dL).   Medical History: Past Medical History:  Diagnosis Date   Abdominal hernia    per pt   Arthritis    hands   Benign localized prostatic hyperplasia with lower urinary tract symptoms (LUTS)    CAD (coronary artery disease) 05/2004   cardiologist--- dr anner;  positive stress test , had outpt cardiac cath 05-27-2004 stenosis RI;  06-18-2004 cath w/ PCI and BMS x1 to pRI;   STEMI 02-16-2009  s/p cath w/ PCI thrombectomy for total occluded RCA , BMS x1 to pRCA;  nuclear stress test 03-15-2012 low risk no ischemia but sig inferior-inferolateral infarct , ef 51%   Dyslipidemia    Essential hypertension    Heart murmur    History of COVID-19 05/2020   per pt mild symptoms that resolved   History of ST elevation myocardial infarction (STEMI) 02/16/2009   inferior STEMI   cath w/ pci w/ BMS to pRCA   History of urinary retention    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[facility])  04/2021   urologist--- dr borden/ radiation oncology-- dr patrcia;  dx 12/ 2022,  Gleason 4+5, PSA 24.6, volume 94.6cc;  plan to start IMRT   OSA (obstructive sleep apnea)    per pt dx approx 2008, no cpap intolerant   PONV (postoperative nausea and vomiting)    Pre-diabetes    S/p bare metal coronary artery stent    01/ 2006  x1 BMS to pRI (CoStar study stent);  and 09/ 2010  x1 BMS to pRCA    Medications:  Infusions:   heparin  1,200 Units/hr (02/11/24 2123)    Assessment: 78 yo M who presented with chest pain. Recently had coronary stent placed (August 2025), found to have in stent thrombosis. Thought to be clopidogrel  failure, transitioned to ticagrelor , clopidogrel  genetic testing in process. Developed a GIB. Last bloody bowel movement 9/10. PMH of Afib, HF. Pharmacy consulted for heparin  management.  Heparin  level 0.16 is below goal on heparin  1050 units/hr.  Hgb 10.6 (improved from 8.9 yesterday), pltc 614 stable.  Last PRBC 9/10; 4u PRBC total this admission.  Now that Hgb is  stable, OK to target full-dose heparin  per AHF MD.  No plans for DCCV yet until bowel issues resolve - tentative next week.   Goal of Therapy:  Heparin  level 0.3-0.5 units/ml Monitor platelets by anticoagulation protocol: Yes   Plan:  Continue IV heparin  at 1200 units/h Monitor daily heparin  level, CBC, and signs/symptoms of bleeding. Follow plans for DCCV  Harlene Denna Berdine JONETTA ARABELLA, [facility] Clinical Pharmacist  02/11/2024 10:37 PM   [facility] pharmacy phone numbers are listed on [email or web address]

## 2024-02-11 NOTE — Progress Notes (Signed)
 Nutrition Follow-up  DOCUMENTATION CODES:   Non-severe (moderate) malnutrition in context of acute illness/injury  INTERVENTION:    Continue Ensure Plus High Protein po BID, each supplement provides 350 kcal and 20 grams of protein    Continue oral MVI w/ minerals   Monitor diet advancement and tolerance  Add Magic cup TID with meals, each supplement provides 290 kcal and 9 grams of protein    NUTRITION DIAGNOSIS:  Moderate Malnutrition related to acute illness as evidenced by energy intake < 75% for > 7 days, moderate muscle depletion, mild muscle depletion.  GOAL:  Patient will meet greater than or equal to 90% of their needs  MONITOR:  PO intake, Supplement acceptance, Diet advancement, I & O's  REASON FOR ASSESSMENT:  Consult Calorie Count, Assessment of nutrition requirement/status, Diet education  ASSESSMENT:   Pt with PMH significant for: ischemic cardiomyopathy and recent LAD w/ stent placement for NSTEMI, HTN, arthritis, prostate cancer. Presented to ED with c/o nausea and vomiting. CT c/f intestinal pneumatosis w/ ischemic versus infectious colitis. Admitted w/ septic shock.  8/31 admitted 9/03 transitioned to [facility] care; diet advanced to CLD 9/04 NGT removed 9/05 - TPN initiated  9/07 FlexiSeal removed  9/08 - 1 unit PRBC transfused 9/09 - 1 unit PRBC transfused; downgraded to clear liquid diet 9/10 - EGD:  9/12 - advanced to full liquid diet 9/13 - advanced to DYS3/thin liquid diet 9/14 - TPN reduced to 1/2 9/15 - TPN stopped 9/19 - advanced to regular texture diet   Patient progressing slowly. TEE tentatively scheduled for today and cardioversion next week.   Average Meal Intake 9/15: 25-100% x2 documented meals 9/16: 50-100% x3 documented meals 9/17: 90-100% x2 documented meals 9/18: 25% x1 documented meal   Intake improving and tolerating. Main barrier to intake is food preference versus lack of appetite. Encouraging patient wife bring in food from  outside facility now that his diet has been advanced. Bowels stable, but loose, per GI documentation. Patient reports this is recent baseline. GI suspects SIBO and interventions ordered. Of note, only documented with one BM yesterday, not four as he reported.    Admit Weight: 88.1 kg Current Weight: 77.9 kg - ? Accuracy +mild, non-pitting edema to BLEs   Weight stable throughout admission. Questionable weight trend as he lost almost 12 pounds overnight. Of note, volume status has improved. CVP of 5-7 today.  Edema down from last week's assessment from 2-3+ to mild, per documentation.     Intake/Output Summary (Last 24 hours) at 02/11/2024 1520 Last data filed at 02/11/2024 0500 Gross per 24 hour  Intake 1296.09 ml  Output 1200 ml  Net 96.09 ml    Net IO Since Admission: -1,404.21 mL [02/11/24 1520]    Drains/Lines: L basilic: PICC, triple lumen (placed 9/01) UOP: x24 hours  Labs stable. Has required potassium supplementation. Continues on MVI. Experienced a blood sugar of >600 on 9/18. Insulin  in place and regimen being adjusted.   Meds:  Famotidine  SSI Novolog  0-5 QHS  SSI Novolog  0-9 TID Melatonin  Nystatin  Protonix  Spironolactone  Torsemide   Drips: Famotidine  Lasix  Milrinone    Labs:  Na+ 135 (wdl) K+ 2.8>3.1>3.8 (wdl) Mg 2.4 (wdl) CBGs 143-227 x24 hours A1c 6.4 (08/2023)    Diet Order:   Diet Order             Diet regular Room service appropriate? Yes; Fluid consistency: Thin  Diet effective now             EDUCATION NEEDS:  No education needs have been identified at this time  Skin:  Skin Assessment: Reviewed RN Assessment  Last BM:  9/18 - type 7 x1  Height:  Ht Readings from Last 1 Encounters:  01/28/24 6' (1.829 m)   Weight:  Wt Readings from Last 1 Encounters:  02/11/24 77.9 kg   Ideal Body Weight:  80.9 kg  BMI:  Body mass index is 23.29 kg/m.  Estimated Nutritional Needs:   Kcal:  1900-2100 kcals  Protein:  95-110g  Fluid:   1.9-2.1L/day  Blair Deaner MS, RD, LDN Registered Dietitian Clinical Nutrition RD Inpatient Contact Info in Amion

## 2024-02-11 NOTE — Assessment & Plan Note (Deleted)
 GI bleed.   SP 4 units PRBC transfusion.   EGD with flexible sigmoidoscopy with red blood in the colon.  Grade 1 esophagitis.  Continue pantoprazole .  Follow up hgb is 11.3

## 2024-02-11 NOTE — Progress Notes (Signed)
 Chase Combs   Subjective: Patient feels about the same today.  Thinks he had 4 loose bowel movements yesterday which she says has been his norm for a while now.  Nonbloody.  Denies abdominal pain.  Appetite not great, but says a lot of it is the food that he is given.  No nausea or vomiting. Tentatively plan for TEE today.   Objective: Vital signs in last 24 hours: Temp:  [97.7 F (36.5 C)-97.9 F (36.6 C)] 97.9 F (36.6 C) (09/19 0737) Pulse Rate:  [91-98] 94 (09/19 0737) Resp:  [15-24] 21 (09/19 0737) BP: (107-130)/(71-97) 113/77 (09/19 0737) SpO2:  [92 %-100 %] 98 % (09/19 0737) Weight:  [77.9 kg] 77.9 kg (09/19 0336) Last BM Date : 02/11/24 General: NAD, pleasant Caucasian male, wife on phone during visit Lungs:  CTA b/l, no w/r/r Heart:  RRR, no m/r/g Abdomen:  Soft, NT, mildly distended, hypoactive tinkling bowel sounds Ext:  No c/c/e    Intake/Output from previous day: 09/18 0701 - 09/19 0700 In: 1416.1 [P.O.:360; I.V.:656.1; IV Piggyback:400] Out: 1200 [Urine:1200] Intake/Output this shift: No intake/output data recorded.   Lab Results: Recent Labs    02/10/24 0709 02/10/24 1504 02/11/24 0500  WBC 4.5 5.4 5.3  HGB 8.9* 10.7* 10.6*  PLT 502* 605* 614*  MCV 88.4 87.7 88.5   BMET Recent Labs    02/10/24 0709 02/10/24 1130 02/11/24 0500  NA 125* 133* 135  K 2.8* 3.1* 3.8  CL 83* 94* 99  CO2 22 27 22   GLUCOSE 662* 227* 143*  BUN 34* 36* 31*  CREATININE 1.21 1.24 1.06  CALCIUM  7.0* 8.4* 8.6*   LFT Recent Labs    02/09/24 0540 02/10/24 0709 02/11/24 0500  PROT 5.7* 4.8* 6.0*  ALBUMIN 2.5* 2.2* 2.7*  AST 22 17 21   ALT 27 21 23   ALKPHOS 77 68 78  BILITOT 0.6 0.5 0.4   PT/INR No results for input(s): INR in the last 72 hours.    Imaging/Other results: DG Abd 1 View Result Date: 02/10/2024 CLINICAL DATA:  Abdominal distension. EXAM: ABDOMEN - 1 VIEW COMPARISON:  February 07, 2024. FINDINGS: Stable colonic  distention is noted. Transverse colon measures 9.3 cm in diameter. No small bowel dilatation is noted. IMPRESSION: Stable colonic distention is noted. Electronically Signed   By: Chase Landy Raddle M.D.   On: 02/10/2024 10:49      Assessment and Plan:  78 year old male with complicated recent medical history to include coronary artery disease with stent placement and subsequent in-stent stenosis, acute systolic heart failure with hypervolemia, A-fib with RVR, admitted with clinical presentation suggestive of ischemic colitis/intestinal pneumatosis, with ongoing issues of colonic pseudoobstruction.  He also experienced GI bleed earlier in this admission prompting cessation of antiplatelet agents.   Colonic pseudoobstruction Patient remains minimally symptomatic in terms of discomfort.  Tolerating regular diet but appetite not great and passing liquid stools daily.  Imaging shows no progression of his colonic distention Hopefully, this will resolve with increased physical activity and improvement in comorbidities.  We again discussed potentially adding neostigmine , but given his lack of significant symptoms and relative contraindications (CAD, recent MI), we we will continue to hold off on this for now. - Continue to maintain normal electrolytes, avoid narcotics/antihistamines/sedatives - Continue regular diet after TEE, encouraged patient to bring in more palatable food from outside, if okay with cardiology/primary team - Anticipate colonic pseudoobstruction will eventually resolve with improvement in mobility/acute cardiac issues  Diarrhea, hypokalemia Patient has had issues  with diarrhea over the past few months going along with his colonic pseudoobstruction.  There is suspicion he may have SIBO, and was previously given Flagyl , which worked well in May, but did not have much of an effect when he was given it this admission.  He continues to have issues with hypokalemia, which may be affecting his  cardiac recovery.  I do not think that his colonic pseudoobstruction is necessarily causing his diarrhea.  It is not clear to me that colonic decompression or neostigmine  would fix his diarrhea.  I think different treatment for SIBO might be more effective to treat his diarrhea, and then hopefully help his hypokalemia. - Will start rifaximin  550 mg p.o. 3 times daily for 14 days   GI bleed/acute blood loss anemia Etiology unclear, but may have been diverticular.  He has not had any bleeding since Sept 11 and his hemoglobin has been stable.  Currently on Brilinta , aspirin  being held.  Oral anticoagulation also not started. - Probably safe to resume anticoagulation with heparin  drip -On Brilinta , aspirin  being held   Coronary artery disease status post stent with subsequent in-stent stenosis - On Brilinta    Systolic heart failure/cardiogenic shock, EF 20-25% - On digoxin , milrinone  drip, Lasix  drip   A-fib with RVR - On amiodarone  drip -- Okay to start anticoagulation from GI standpoint.    Chase FORBES Holt, MD  02/11/2024, 9:10 AM [facility]

## 2024-02-12 DIAGNOSIS — I5023 Acute on chronic systolic (congestive) heart failure: Secondary | ICD-10-CM | POA: Diagnosis not present

## 2024-02-12 DIAGNOSIS — I2510 Atherosclerotic heart disease of native coronary artery without angina pectoris: Secondary | ICD-10-CM | POA: Diagnosis not present

## 2024-02-12 DIAGNOSIS — K559 Vascular disorder of intestine, unspecified: Secondary | ICD-10-CM | POA: Diagnosis not present

## 2024-02-12 DIAGNOSIS — I4891 Unspecified atrial fibrillation: Secondary | ICD-10-CM | POA: Diagnosis not present

## 2024-02-12 DIAGNOSIS — I10 Essential (primary) hypertension: Secondary | ICD-10-CM | POA: Diagnosis not present

## 2024-02-12 DIAGNOSIS — I255 Ischemic cardiomyopathy: Secondary | ICD-10-CM

## 2024-02-12 LAB — COOXEMETRY PANEL
Carboxyhemoglobin: 1.8 % — ABNORMAL HIGH (ref 0.5–1.5)
Methemoglobin: <0.7 % (ref 0.0–1.5)
O2 Saturation: 67.2 %
Total hemoglobin: 11.5 g/dL — ABNORMAL LOW (ref 12.0–16.0)

## 2024-02-12 LAB — COMPREHENSIVE METABOLIC PANEL WITH GFR
ALT: 24 U/L (ref 0–44)
AST: 22 U/L (ref 15–41)
Albumin: 2.9 g/dL — ABNORMAL LOW (ref 3.5–5.0)
Alkaline Phosphatase: 80 U/L (ref 38–126)
Anion gap: 14 (ref 5–15)
BUN: 26 mg/dL — ABNORMAL HIGH (ref 8–23)
CO2: 20 mmol/L — ABNORMAL LOW (ref 22–32)
Calcium: 8.6 mg/dL — ABNORMAL LOW (ref 8.9–10.3)
Chloride: 103 mmol/L (ref 98–111)
Creatinine, Ser: 1.21 mg/dL (ref 0.61–1.24)
GFR, Estimated: >60 mL/min (ref >60)
Glucose, Bld: 135 mg/dL — ABNORMAL HIGH (ref 70–99)
Potassium: 3.6 mmol/L (ref 3.5–5.1)
Sodium: 137 mmol/L (ref 135–145)
Total Bilirubin: 0.8 mg/dL (ref 0.0–1.2)
Total Protein: 6.4 g/dL — ABNORMAL LOW (ref 6.5–8.1)

## 2024-02-12 LAB — CBC
HCT: 35.6 % — ABNORMAL LOW (ref 39.0–52.0)
Hemoglobin: 11.1 g/dL — ABNORMAL LOW (ref 13.0–17.0)
MCH: 27.6 pg (ref 26.0–34.0)
MCHC: 31.2 g/dL (ref 30.0–36.0)
MCV: 88.6 fL (ref 80.0–100.0)
Platelets: 725 K/uL — ABNORMAL HIGH (ref 150–400)
RBC: 4.02 MIL/uL — ABNORMAL LOW (ref 4.22–5.81)
RDW: 19.4 % — ABNORMAL HIGH (ref 11.5–15.5)
WBC: 6.1 K/uL (ref 4.0–10.5)
nRBC: 0 % (ref 0.0–0.2)

## 2024-02-12 LAB — HEPARIN LEVEL (UNFRACTIONATED): Heparin Unfractionated: 0.34 [IU]/mL (ref 0.30–0.70)

## 2024-02-12 LAB — GLUCOSE, CAPILLARY
Glucose-Capillary: 126 mg/dL — ABNORMAL HIGH (ref 70–99)
Glucose-Capillary: 160 mg/dL — ABNORMAL HIGH (ref 70–99)
Glucose-Capillary: 88 mg/dL (ref 70–99)
Glucose-Capillary: 123 mg/dL — ABNORMAL HIGH (ref 70–99)

## 2024-02-12 LAB — MAGNESIUM: Magnesium: 2.4 mg/dL (ref 1.7–2.4)

## 2024-02-12 MED ORDER — POTASSIUM CHLORIDE 20 MEQ PO PACK
40.0000 meq | PACK | Freq: Once | ORAL | Status: AC
  Administered 2024-02-12: 40 meq via ORAL
  Filled 2024-02-12: qty 2

## 2024-02-12 MED ORDER — METOCLOPRAMIDE HCL 5 MG/ML IJ SOLN
INTRAMUSCULAR | Status: AC
  Filled 2024-02-12: qty 2

## 2024-02-12 MED ORDER — POTASSIUM CHLORIDE CRYS ER 20 MEQ PO TBCR
40.0000 meq | EXTENDED_RELEASE_TABLET | Freq: Once | ORAL | Status: DC
  Filled 2024-02-12: qty 2

## 2024-02-12 MED ORDER — GUAIFENESIN-DM 100-10 MG/5ML PO SYRP
5.0000 mL | ORAL_SOLUTION | ORAL | Status: DC | PRN
  Administered 2024-02-14 – 2024-02-15 (×2): 5 mL via ORAL
  Filled 2024-02-12 (×2): qty 5

## 2024-02-12 MED ORDER — METOCLOPRAMIDE HCL 5 MG/ML IJ SOLN
5.0000 mg | Freq: Once | INTRAMUSCULAR | Status: AC
  Administered 2024-02-12: 5 mg via INTRAVENOUS

## 2024-02-12 NOTE — Progress Notes (Deleted)
 PHARMACY - ANTICOAGULATION CONSULT NOTE  Pharmacy Consult for heparin  infusion Indication: atrial fibrillation  Allergies  Allergen Reactions   Codeine Nausea And Vomiting   Latex Rash    Oral blisters when used at dentist    Patient Measurements: Height: 6' (182.9 cm) Weight: 80.5 kg (177 lb 7.5 oz) IBW/kg (Calculated) : 77.6 HEPARIN  DW (KG): 91.1  Vital Signs: Temp: 98.9 F (37.2 C) (09/20 0727) Temp Source: Oral (09/20 0727) BP: 98/68 (09/20 0727) Pulse Rate: 90 (09/20 0727)  Labs: Recent Labs    02/10/24 0709 02/10/24 0710 02/10/24 1130 02/10/24 1504 02/11/24 0100 02/11/24 0500 02/11/24 1227 02/11/24 2200 02/12/24 0500  HGB 8.9*  --   --  10.7*  --  10.6*  --   --  11.1*  HCT 28.1*  --   --  33.4*  --  33.7*  --   --  35.6*  PLT 502*  --   --  605*  --  614*  --   --  725*  HEPARINUNFRC  --    < >  --   --    < >  --  0.16* 0.30 0.34  CREATININE 1.21  --  1.24  --   --  1.06  --   --   --    < > = values in this interval not displayed.    Estimated Creatinine Clearance: 65.1 mL/min (by C-G formula based on SCr of 1.06 mg/dL).   Medical History: Past Medical History:  Diagnosis Date   Abdominal hernia    per pt   Arthritis    hands   Benign localized prostatic hyperplasia with lower urinary tract symptoms (LUTS)    CAD (coronary artery disease) 05/2004   cardiologist--- dr anner;  positive stress test , had outpt cardiac cath 05-27-2004 stenosis RI;  06-18-2004 cath w/ PCI and BMS x1 to pRI;   STEMI 02-16-2009  s/p cath w/ PCI thrombectomy for total occluded RCA , BMS x1 to pRCA;  nuclear stress test 03-15-2012 low risk no ischemia but sig inferior-inferolateral infarct , ef 51%   Dyslipidemia    Essential hypertension    Heart murmur    History of COVID-19 05/2020   per pt mild symptoms that resolved   History of ST elevation myocardial infarction (STEMI) 02/16/2009   inferior STEMI   cath w/ pci w/ BMS to pRCA   History of urinary retention     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[facility]) 04/2021   urologist--- dr borden/ radiation oncology-- dr patrcia;  dx 12/ 2022,  Gleason 4+5, PSA 24.6, volume 94.6cc;  plan to start IMRT   OSA (obstructive sleep apnea)    per pt dx approx 2008, no cpap intolerant   PONV (postoperative nausea and vomiting)    Pre-diabetes    S/p bare metal coronary artery stent    01/ 2006  x1 BMS to pRI (CoStar study stent);  and 09/ 2010  x1 BMS to pRCA    Medications:  Infusions:   heparin  1,200 Units/hr (02/11/24 2123)    Assessment: 78 yo M who presented with chest pain. Recently had coronary stent placed (August 2025), found to have in stent thrombosis. Thought to be clopidogrel  failure, transitioned to ticagrelor , clopidogrel  genetic testing in process. Developed a GIB. Last bloody bowel movement 9/10. PMH of Afib, HF. Pharmacy consulted for heparin  management.  Heparin  level 0.34 is therapeutic with heparin  running at 1200 units/hr. Hgb (11.1) and PLTs (725) are stable. Per RN, no report  of pauses, issues with the line, or signs of bleeding. Now that Hgb is stable, OK to target full-dose heparin  per AHF MD. No plans for DCCV yet until bowel issues resolve - tentative next week.   Goal of Therapy:  Heparin  level 0.3-0.5 units/ml Monitor platelets by anticoagulation protocol: Yes   Plan:  Continue IV heparin  at 1200 units/h Monitor daily heparin  level, CBC, and signs/symptoms of bleeding. Follow plans for DCCV  Thank you for allowing pharmacy to be a part of this patient's care.   Nidia Schaffer, PharmD PGY2 Cardiology Pharmacy Resident  Please check AMION for all [facility] Pharm[facility] phone numbers After 10:00 PM, call Main Pharmacy [phone]  02/12/2024 7:36 AM

## 2024-02-12 NOTE — Progress Notes (Signed)
 Progress Note  Patient Name: Chase Combs Date of Encounter: 02/12/2024 Primary Cardiologist: Alm Clay, MD  Covering for AHF team weekend of 02/12/24  Subjective   Overnight Hgb has improved. Patient notes No CP, SOB, Palpitations.  Eager to go home. Wife join us  via speaker phone.  Vital Signs    Vitals:   02/11/24 1927 02/11/24 2158 02/12/24 0448 02/12/24 0727  BP: 108/79 125/76 119/76 98/68  Pulse: 97 95 100 90  Resp: 13 19 20 19   Temp: 97.8 F (36.6 C) 97.7 F (36.5 C) 97.9 F (36.6 C) 98.9 F (37.2 C)  TempSrc: Oral Oral Oral Oral  SpO2: 96% 93% 92% 93%  Weight:   80.5 kg   Height:        Intake/Output Summary (Last 24 hours) at 02/12/2024 0830 Last data filed at 02/12/2024 0816 Gross per 24 hour  Intake 309.48 ml  Output 800 ml  Net -490.52 ml   Filed Weights   02/10/24 0404 02/11/24 0336 02/12/24 0448  Weight: 83.3 kg 77.9 kg 80.5 kg    Physical Exam   GEN: No acute distress.   Neck: No JVD Cardiac: iRRR, no murmurs, rubs, or gallops.  Respiratory: Clear to auscultation bilaterally. GI: Soft, nontender, non-distended  MS: No edema  Labs   Telemetry: AF largely rate controled with PVCs   Chemistry Recent Labs  Lab 02/10/24 0709 02/10/24 1130 02/11/24 0500 02/12/24 0500  NA 125* 133* 135 137  K 2.8* 3.1* 3.8 3.6  CL 83* 94* 99 103  CO2 22 27 22  20*  GLUCOSE 662* 227* 143* 135*  BUN 34* 36* 31* 26*  CREATININE 1.21 1.24 1.06 1.21  CALCIUM  7.0* 8.4* 8.6* 8.6*  PROT 4.8*  --  6.0* 6.4*  ALBUMIN 2.2*  --  2.7* 2.9*  AST 17  --  21 22  ALT 21  --  23 24  ALKPHOS 68  --  78 80  BILITOT 0.5  --  0.4 0.8  GFRNONAA >60 >60 >60 >60  ANIONGAP 20* 12 14 14      Hematology Recent Labs  Lab 02/10/24 1504 02/11/24 0500 02/12/24 0500  WBC 5.4 5.3 6.1  RBC 3.81* 3.81* 4.02*  HGB 10.7* 10.6* 11.1*  HCT 33.4* 33.7* 35.6*  MCV 87.7 88.5 88.6  MCH 28.1 27.8 27.6  MCHC 32.0 31.5 31.2  RDW 18.9* 19.4* 19.4*  PLT 605* 614* 725*      Cardiac Studies   Cardiac Studies & Procedures   ______________________________________________________________________________________________ CARDIAC CATHETERIZATION  CARDIAC CATHETERIZATION 02/01/2024  Conclusion Images from the original result were not included. Coronary angiography and intervention 02/01/2024: LM: Distal 45% stenosis LAD: Ostially occluded stent with acute stent thrombosis (Culprit vessel) Ramus: 50% in-stent restenosis Lcx: Mild diffuse disease RCA: Not engaged today  LVEDP 27 mmHg  Successful percutaneous coronary intervention ostial prox LAD PTCA with serial balloon dilatation, up to 3.0  X 12 mm [place] balloon up to 20 atm    Manish JINNY Lawrence, MD  Findings Coronary Findings Diagnostic  Dominance: Right  Left Main Vessel is large. Mid LM to Dist LM lesion is 45% stenosed. The lesion is severely calcified.  Left Anterior Descending Vessel is moderate in size. Ost LAD to Prox LAD lesion is 100% stenosed. The lesion was previously treated .  First Diagonal Branch Vessel is small in size.  Second Diagonal Branch Vessel is small in size.  Third Diagonal Branch Vessel is small in size.  Ramus Intermedius Vessel is moderate in size.  Ramus lesion is 50% stenosed. The lesion was previously treated .  Left Circumflex Vessel is large. The vessel exhibits minimal luminal irregularities.  First Obtuse Marginal Branch Vessel is small in size.  Second Obtuse Marginal Branch Vessel is large in size.  Third Obtuse Marginal Branch Vessel is small in size.  Right Coronary Artery Vessel is large. Prox RCA lesion is 45% stenosed. The lesion was previously treated . Prox RCA to Mid RCA lesion is 40% stenosed.  Right Posterior Descending Artery Vessel is large in size.  Right Posterior Atrioventricular Artery Vessel is moderate in size.  First Right Posterolateral Branch Vessel is small in size.  Second Right Posterolateral  Branch Vessel is small in size.  Third Right Posterolateral Branch Vessel is small in size.  Intervention  Ost LAD to Prox LAD lesion Angioplasty CATH LAUNCHER 6FR EBU 3.75 guide catheter was inserted. WIRE HI TORQ WIGGLE 190CM guidewire used to cross lesion. Balloon angioplasty was performed using a BALLOON TAKERU 1.5X6. Maximum pressure: 12 atm. Inflation time: 15 sec.  A second ballloon was used, using a standard  BALLOON EMERGE MR 2.0X8. Maximum pressure:  8 atm. Inflation time:  15 sec.  A third ballloon was used, using a semi-compliant BALLOON [place] EUPHORA RX 3.0X12. Maximum pressure:  20 atm.  Inflation time:  15 sec. A standard balloon was used in the side branch. Post-Intervention Lesion Assessment The intervention was successful. Pre-interventional TIMI flow is 0. Post-intervention TIMI flow is 3. Ultrasound (IVUS) was performed on the lesion post PCI. There is a 0% residual stenosis post intervention.   CARDIAC CATHETERIZATION  CARDIAC CATHETERIZATION 01/17/2024  Conclusion Conclusions: Severe single-vessel coronary artery disease with 70-80% proximal LAD stenosis that is highly significant by hemodynamic assessment (RFR = 0.67).  Moderate LMCA disease (MLA 6.5 mm) and 40% proximal/mid RCA [place]ovo disease also noted. Patent ramus intermedius stent with 50% in-stent restenosis. Patent proximal RCA stent with 40-50% in-stent restenosis. Upper normal left heart filling pressures (PCWP and LVEDP 15 mmHg). Mildly elevated right heart and pulmonary artery pressures (mean RA 7, RV 47/7, mean PA 25 mmHg). Normal Fick cardiac output/index (CO 6.7 L/min, CI 3.1 L/min/m). Successful IVUS-guided PCI to ostial/proximal LAD using Synergy XD 3.0 x 20 mm drug-eluting stent with 0% residual stenosis and TIMI-3 flow.  Recommendations: Dual antiplatelet therapy with aspirin  and ticagrelor  for at least 12 months. Aggressive secondary prevention of coronary artery disease. Maintain net even to  slightly negative fluid balance. Continue escalation of goal-directed medical therapy for mixed ischemic and nonischemic cardiomyopathy. Keep at [facility] for ongoing management of CAD/CHF as well as pneumonia/bronchitis.  Lonni Hanson, MD [facility]  Findings Coronary Findings Diagnostic  Dominance: Right  Left Main Vessel is large. Mid LM to Dist LM lesion is 45% stenosed. The lesion is severely calcified. Ultrasound (IVUS) was performed. Cross-sectional area: 6.5 mm.  Left Anterior Descending Vessel is moderate in size. Ost LAD to Prox LAD lesion is 75% stenosed. Pressure gradient was performed on the lesion using a GUIDEWIRE PRESSURE X 175 and CATH VISTA GUIDE 6FR XBLD 3.5. RFR: 0.67. Ultrasound (IVUS) was performed. Severe plaque burden was detected. IVUS has determined that the lesion is calcified.  First Diagonal Branch Vessel is small in size.  Second Diagonal Branch Vessel is small in size.  Third Diagonal Branch Vessel is small in size.  Ramus Intermedius Vessel is moderate in size. Ramus lesion is 50% stenosed. The lesion was previously treated .  Left Circumflex Vessel is large. The vessel  exhibits minimal luminal irregularities.  First Obtuse Marginal Branch Vessel is small in size.  Second Obtuse Marginal Branch Vessel is large in size.  Third Obtuse Marginal Branch Vessel is small in size.  Right Coronary Artery Vessel is large. Prox RCA lesion is 45% stenosed. The lesion was previously treated . Prox RCA to Mid RCA lesion is 40% stenosed.  Right Posterior Descending Artery Vessel is large in size.  Right Posterior Atrioventricular Artery Vessel is moderate in size.  First Right Posterolateral Branch Vessel is small in size.  Second Right Posterolateral Branch Vessel is small in size.  Third Right Posterolateral Branch Vessel is small in size.  Intervention  Ost LAD to Prox LAD lesion Stent CATH VISTA GUIDE 6FR XBLD 3.5 guide  catheter was inserted. Lesion crossed with guidewire using a GUIDEWIRE PRESSURE X 175. Pre-stent angioplasty was performed using a BALLOON EMERGE MR 2.5X15. Maximum pressure:  12 atm.  A second angioplasty balloon was used, using a BALLOON SCOREFLEX 2.75X10. Maximum pressure:  16 atm. A drug-eluting stent was successfully placed using a STENT SYNERGY XD 3.0X20. Maximum pressure: 11 atm. Stent strut is well apposed. Post-stent angioplasty was performed using a BALLOON [place] EUPHORA RX 3.0X15. Maximum pressure:  18 atm. Post-Intervention Lesion Assessment The intervention was successful. Pre-interventional TIMI flow is 3. Post-intervention TIMI flow is 3. No complications occurred at this lesion. There is a 0% residual stenosis post intervention.   STRESS TESTS  MYOCARDIAL PERFUSION IMAGING 07/12/2023  Interpretation Summary   Findings are consistent with infarction. The study is high risk.   No ST deviation was noted.   LV perfusion is abnormal. There is no evidence of ischemia. There is evidence of infarction. Defect 1: There is a medium defect with moderate reduction in uptake present in the apical to basal inferior and apex location(s) that is fixed. There is abnormal wall motion in the defect area. Consistent with infarction.   Left ventricular function is abnormal. Nuclear stress EF: 31%. The left ventricular ejection fraction is moderately decreased (30-44%). End diastolic cavity size is severely enlarged. End systolic cavity size is severely enlarged.   Prior study available for comparison from 03/05/2012.  Fixed inferior perfusion defect with abnormal wall motion consistent with infarct LVEF 31% High risk study due to systolic dysfunction.  No ischemia   ECHOCARDIOGRAM  ECHOCARDIOGRAM COMPLETE 02/02/2024  Narrative ECHOCARDIOGRAM REPORT    Patient Name:   TAYVIEN KANE Date of Exam: 02/02/2024 Medical Rec #:  [id number]      Height:       72.0 in Accession #:    [id number]      Weight:       226.0 lb Date of Birth:  1946-04-06      BSA:          2.244 m Patient Age:    78 years       BP:           102/69 mmHg Patient Gender: M              HR:           102 bpm. Exam Location:  Inpatient  Procedure: 2D Echo, Cardiac Doppler and Color Doppler (Both Spectral and Color Flow Doppler were utilized during procedure).  Indications:    Congestive Heart Failure I50.9  History:        Patient has prior history of Echocardiogram examinations, most recent 01/13/2024. CAD; Risk Factors:Hypertension and Sleep Apnea.  Sonographer:    Jayson Gaskins  Referring Phys: [id number] AMY D CLEGG  IMPRESSIONS   1. Left ventricular ejection fraction, by estimation, is 20 to 25%. The left ventricle has severely decreased function. The left ventricle demonstrates global hypokinesis. There is moderate concentric left ventricular hypertrophy. Indeterminate diastolic filling due to E-A fusion. There is abnormal (paradoxical) septal motion, consistent with right ventricular volume overload. 2. Right ventricular systolic function is moderately reduced. The right ventricular size is mildly enlarged. Tricuspid regurgitation signal is inadequate for assessing PA pressure. The estimated right ventricular systolic pressure is 30.7 mmHg. 3. The mitral valve is degenerative. Moderate mitral valve regurgitation. 4. Tricuspid valve regurgitation is mild to moderate. 5. The aortic valve is tricuspid. Aortic valve regurgitation is trivial. Aortic valve sclerosis is present, with no evidence of aortic valve stenosis. 6. The inferior vena cava is dilated in size with <50% respiratory variability, suggesting right atrial pressure of 15 mmHg.  Comparison(s): Changes from prior study are noted. 01/13/2024: LVEF 25-30%, moderate RV systolic dysfunction.  FINDINGS Left Ventricle: Left ventricular ejection fraction, by estimation, is 20 to 25%. The left ventricle has severely decreased function. The left ventricle  demonstrates global hypokinesis. The left ventricular internal cavity size was normal in size. There is moderate concentric left ventricular hypertrophy. Abnormal (paradoxical) septal motion, consistent with right ventricular volume overload. Indeterminate diastolic filling due to E-A fusion.  Right Ventricle: The right ventricular size is mildly enlarged. No increase in right ventricular wall thickness. Right ventricular systolic function is moderately reduced. Tricuspid regurgitation signal is inadequate for assessing PA pressure. The tricuspid regurgitant velocity is 1.98 m/s, and with an assumed right atrial pressure of 15 mmHg, the estimated right ventricular systolic pressure is 30.7 mmHg.  Left Atrium: Left atrial size was normal in size.  Right Atrium: Right atrial size was normal in size.  Pericardium: There is no evidence of pericardial effusion.  Mitral Valve: The mitral valve is degenerative in appearance. Moderate mitral valve regurgitation.  Tricuspid Valve: The tricuspid valve is normal in structure. Tricuspid valve regurgitation is mild to moderate.  Aortic Valve: The aortic valve is tricuspid. Aortic valve regurgitation is trivial. Aortic valve sclerosis is present, with no evidence of aortic valve stenosis. Aortic valve mean gradient measures 5.0 mmHg. Aortic valve peak gradient measures 8.0 mmHg. Aortic valve area, by VTI measures 2.17 cm.  Pulmonic Valve: The pulmonic valve was normal in structure. Pulmonic valve regurgitation is trivial.  Aorta: The aortic root is normal in size and structure.  Venous: The inferior vena cava is dilated in size with less than 50% respiratory variability, suggesting right atrial pressure of 15 mmHg.  IAS/Shunts: No atrial level shunt detected by color flow Doppler.   LEFT VENTRICLE PLAX 2D LVIDd:         6.10 cm LVIDs:         5.70 cm LV PW:         1.20 cm LV IVS:        1.30 cm LVOT diam:     2.00 cm LV SV:         47 LV SV  Index:   21 LVOT Area:     3.14 cm   RIGHT VENTRICLE RV S prime:     14.00 cm/s TAPSE (M-mode): 1.8 cm  LEFT ATRIUM             Index        RIGHT ATRIUM           Index LA Vol (A2C):  93.1 ml 41.49 ml/m  RA Area:     15.90 cm LA Vol (A4C):   30.5 ml 13.59 ml/m  RA Volume:   40.70 ml  18.14 ml/m LA Biplane Vol: 56.5 ml 25.18 ml/m AORTIC VALVE AV Area (Vmax):    2.12 cm AV Area (Vmean):   1.99 cm AV Area (VTI):     2.17 cm AV Vmax:           141.00 cm/s AV Vmean:          101.000 cm/s AV VTI:            0.216 m AV Peak Grad:      8.0 mmHg AV Mean Grad:      5.0 mmHg LVOT Vmax:         95.00 cm/s LVOT Vmean:        63.900 cm/s LVOT VTI:          0.149 m LVOT/AV VTI ratio: 0.69  AORTA Ao Root diam: 2.90 cm  MITRAL VALVE               TRICUSPID VALVE MV Area (PHT): 2.93 cm    TR Peak grad:   15.7 mmHg MV Decel Time: 259 msec    TR Vmax:        198.00 cm/s MV E velocity: 87.50 cm/s SHUNTS Systemic VTI:  0.15 m Systemic Diam: 2.00 cm  Vinie Maxcy MD Electronically signed by Vinie Maxcy MD Signature Date/Time: 02/02/2024/3:33:36 PM    Final          ______________________________________________________________________________________________      Assessment & Plan   CAD  - with Recent PCI 8/25 and Stent thrombsis 9/9 - on ticagrelor  with plavix  genetic testing pending; in review with Dr. 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 - complicated by fungal rash on SGLT2i and hypotension and inability to take ARB/ARNI (home med) - NYHA IV but minimally active - Continue MRA and dig; PO Torsemide  and now off milrinone   Anemia and GI bleed - for this reason patient is now on triple therapy - Hgb improved; planned for conservative w/u  New AF - on amiodarone  PO and digoxin , tolerating heparin  - AHF team plans DCCV at some point next week (Monday  or Tuesday potentially)  with PO AC transition 9/21 if doing well    For questions or updates, please contact [facility] Please consult [email or web address] for contact info under Cardiology/STEMI.      Stanly Leavens, MD FASE [facility] Cardiologist [facility]  [address] [place], [address] [place], [place] [address] [phone]  8:30 AM

## 2024-02-12 NOTE — Progress Notes (Signed)
 PHARMACY - ANTICOAGULATION CONSULT NOTE  Pharmacy Consult for heparin  infusion Indication: atrial fibrillation  Allergies  Allergen Reactions   Codeine Nausea And Vomiting   Latex Rash    Oral blisters when used at dentist    Patient Measurements: Height: 6' (182.9 cm) Weight: 80.5 kg (177 lb 7.5 oz) IBW/kg (Calculated) : 77.6 HEPARIN  DW (KG): 91.1  Vital Signs: Temp: 98.9 F (37.2 C) (09/20 0727) Temp Source: Oral (09/20 0727) BP: 98/68 (09/20 0727) Pulse Rate: 90 (09/20 0727)  Labs: Recent Labs    02/10/24 1130 02/10/24 1504 02/11/24 0100 02/11/24 0500 02/11/24 1227 02/11/24 2200 02/12/24 0500  HGB  --  10.7*  --  10.6*  --   --  11.1*  HCT  --  33.4*  --  33.7*  --   --  35.6*  PLT  --  605*  --  614*  --   --  725*  HEPARINUNFRC  --   --    < >  --  0.16* 0.30 0.34  CREATININE 1.24  --   --  1.06  --   --  1.21   < > = values in this interval not displayed.    Estimated Creatinine Clearance: 57 mL/min (by C-G formula based on SCr of 1.21 mg/dL).   Medical History: Past Medical History:  Diagnosis Date   Abdominal hernia    per pt   Arthritis    hands   Benign localized prostatic hyperplasia with lower urinary tract symptoms (LUTS)    CAD (coronary artery disease) 05/2004   cardiologist--- dr anner;  positive stress test , had outpt cardiac cath 05-27-2004 stenosis RI;  06-18-2004 cath w/ PCI and BMS x1 to pRI;   STEMI 02-16-2009  s/p cath w/ PCI thrombectomy for total occluded RCA , BMS x1 to pRCA;  nuclear stress test 03-15-2012 low risk no ischemia but sig inferior-inferolateral infarct , ef 51%   Dyslipidemia    Essential hypertension    Heart murmur    History of COVID-19 05/2020   per pt mild symptoms that resolved   History of ST elevation myocardial infarction (STEMI) 02/16/2009   inferior STEMI   cath w/ pci w/ BMS to pRCA   History of urinary retention    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[facility]) 04/2021   urologist--- dr borden/ radiation  oncology-- dr patrcia;  dx 12/ 2022,  Gleason 4+5, PSA 24.6, volume 94.6cc;  plan to start IMRT   OSA (obstructive sleep apnea)    per pt dx approx 2008, no cpap intolerant   PONV (postoperative nausea and vomiting)    Pre-diabetes    S/p bare metal coronary artery stent    01/ 2006  x1 BMS to pRI (CoStar study stent);  and 09/ 2010  x1 BMS to pRCA    Medications:  Infusions:   heparin  1,200 Units/hr (02/11/24 2123)    Assessment: 78 yo M who presented with chest pain. Recently had coronary stent placed (August 2025), found to have in stent thrombosis. Thought to be clopidogrel  failure, transitioned to ticagrelor , clopidogrel  genetic testing in process. Developed a GIB. Last bloody bowel movement 9/10. PMH of Afib, HF. Pharmacy consulted for heparin  management.  Heparin  level 0.34 therapeutic on 1200 units/hr. Hgb 10.6 > 11.1, pltc 725. Last PRBC 9/10; 4u PRBC total this admission.  Now that Hgb is stable, OK to target full-dose heparin  per AHF MD.  No plans for DCCV yet until bowel issues resolve - tentative next week.  Goal of Therapy:  Heparin  level 0.3-0.5 units/ml Monitor platelets by anticoagulation protocol: Yes   Plan:  Continue IV heparin  at 1200 units/h Monitor daily heparin  level, CBC, and signs/symptoms of bleeding. Follow plans for DCCV  Maurilio Fila, PharmD Clinical Pharmacist 02/12/2024  8:00 AM

## 2024-02-12 NOTE — Progress Notes (Signed)
 Progress Note   Patient: Chase Combs FMW:[id number] DOB: Jul 19, 1945 DOA: 01/22/2024     21 DOS: the patient was seen and examined on 02/12/2024   Brief hospital course: Chase Combs was admitted to the hospital with the working diagnosis of ileitis/ ischemic colitis.   78 year old with history of ischemic cardiomyopathy/LAD and heart failure  admitted to the hospital for nausea and vomiting.  Recent hospitalization 08/15 tp 01/19/24 for heart failure, he was diagnosed with severe proximal LAD stenosis which was stented with drug eluding stent. Discharged on dual antiplatelet therapy, goal directed medical therapy for heart failure and 20 mg furosemide  for diuresis.  At home patient had progressive nausea and vomiting, not able to maintain oral medications including antiplatelet therapy.  [name] he was hypotensive with systolic blood pressure 94/51, HR 50, RR 23 and 02 saturation 94% temp 103.5  Lungs with increased work of breathing with no wheezing, heart with S1 and S2 present and tachycardic, abdomen with diffuse tenderness to palpation in the lower quadrants more right than left, no lower extremity edema.   Na 132, K 4.5 Cl 81 bicarbonate 31, glucose 197 bun 64 cr 2,55 AST 29 ALT 31  BNP 220  High sensitive troponin 126 and 108  Lactic acid 3,4  Wbc 2.4 hgb 13.9 plt 489  Urine analysis SG 1,020, protein negative, negative leukocytes and negative hgb  C diff negative   Chest radiograph with hypoinflation, positive cardiomegaly, bilateral basal atelectasis and small bilateral pleural effusions.   EKG 120 bpm, left axis deviation, qtc 501, sinus rhythm with multiple PVC (right bundle branch morphology), with no significant ST segment or T wave changes, (noisy baseline)   CT chest abdomen and pelvis. Bilateral faint ground glass opacities,  Gas along the wall of the ascending colon and cecum, suspicious for pneumoatosis.  Fluid filled colon suggests a  diarrheal state. Diffuse colonic and small bowel distention likely represent ileus.  Increased small volume pelvic and new right sided pericolonic trace fluid, likely secondary to colonic process.  Right nephrolithiasis Suspicion for minimal calcific pancreatitis.   General surgery recommended conservative management.   Concern patient not able to absorb antiplatelet therapy, he was placed on IV cangrelor .  Patient continue to be hypotensive despite IV fluids and was placed on norepinephrine  infusion, consistent with septic shock due to abdominal source/ ischemic colitis.   During hospitalization required PRBC transfusion due to blood loss anemia.   There was concerns of in-stent thrombosis from recent PCI therefore antiplatelets were switched from Cangrelor  to Plavix  and started on milrinone  drip due to cardiogenic shock.   Developed atrial fibrillation with rapid ventricular response, placed on IV amiodarone .  Transition to Aggrastat for anticoagulation.  09/05 GI consulted, clinical picture consistent with colonic ileus, Ogilvie syndrome.  09/08 PRBC transfusion x1, transitioned to clopidogrel .  09/09 chest pain with new antero lateral ST elevations. Underwent urgent cardiac catheterization, LAD with ostially occluded stent with acute stent thrombosis (Culprit vessel). Successful percutaneous coronary intervention ostial proximal LAD, PTCA with balloon dilatation.  Significant volume overloaded with low SV02 and CVP very high, placed on IV furosemide  and milrinone .  09/10 completed antibiotic course.  09/10 EGD with esophageal ulcer with no bleeding and no stigmata of recent bleeding.  LA grade C reflux esophagitis with no bleeding.  Duodenal erosions without bleeding.  Flexible sigmoidoscopy positive blood in the rectum, in the sigmoid colon, in the descending colon and in the transverse colon. No obvious source of bleed.  Diverticulosis in the sigmoid colon and in the descending colon. No  active bleeding.  A single superficial ulcer in the rectum. No active bleeding.  09/12 transition to ticagrelor .  Slowly weaned off TPN, diet as tolerated.   09/19 continue colonic pseudo obstruction, imaging with progression of colonic distention. Started on rifaximin .  Pending cardioversion until bowel function improves.    Assessment and Plan: * Intestinal ischemia (HCC) Intestinal pneumatosis/ colonic adynamic ileus  Pseudo obstruction.  Complicated with sepsis, present on admission.  Completed antibiotic therapy on 09/10 TPN has been weaned off.   Trial of rifaximin  550 mg po tid.   Acute on chronic systolic CHF (congestive heart failure) (HCC) Echocardiogram with reduced LV systolic function EF 20 to 25%, global hypokinesis, moderate LVH, diastolic dysfunction with EA fusion,  RV systolic function moderately reduced, mild RV enlargement, RVSP 30.7 mmHg, moderate mitral valve regurgitation, mild to moderate TR,   Urine output is 1200 ml Systolic blood pressure 110 mmHg range  Off milrinone   Medical therapy with digoxin  and spironolactone . Diuresis with torsemide .   Coronary artery disease involving native coronary artery of native heart without angina pectoris Recent STEMI with stent thrombosis.  Continue with ticagrelor , holding asa due to GI bleeding.  Continue statin.   Essential hypertension Continue blood pressure monitoring Diuresis with torsemide  and spironolactone .   Atrial fibrillation with RVR (HCC) Rate control with amiodarone , continue anticoagulation with IV heparin  Pending cardioversion when GI status has improved.   AKI (acute kidney injury) (HCC) Hyponatremia, hypokalemia.   Renal function with serum cr 1,0 Na 135, K 3.8 with serum bicarbonate 22  Na 135   Continue close follow up renal function and electrolytes Continue diuresis with torsemide   Anemia due to acute blood loss GI bleed.   SP 4 units PRBC transfusion.   EGD with flexible  sigmoidoscopy with red blood in the colon.  Grade 1 esophagitis.  Continue pantoprazole .   Malnutrition of moderate degree Continue nutritional supplements.   Type 2 diabetes mellitus with hyperlipidemia (HCC) Continue insulin  sliding scale for glucose cover and monitoring        Subjective: Patient continue to have diarrhea and abdominal distention, mild nausea with no vomiting, tolerating po well.   Physical Exam: Vitals:   02/11/24 1927 02/11/24 2158 02/12/24 0448 02/12/24 0727  BP: 108/79 125/76 119/76 98/68  Pulse: 97 95 100 90  Resp: 13 19 20 19   Temp: 97.8 F (36.6 C) 97.7 F (36.5 C) 97.9 F (36.6 C) 98.9 F (37.2 C)  TempSrc: Oral Oral Oral Oral  SpO2: 96% 93% 92% 93%  Weight:   80.5 kg   Height:       Neurology awake and alert, deconditioned ENT with mild pallor with no icterus Cardiovascular with S1 and S2 present, and irregular with no gallops or rubs, positive systolic murmur at the apex Respiratory with no rales or wheezing, no rhonchi  Abdomen is distended and tympanic, soft and non tender Lower extremity trace edema. Unna boots in place  Data Reviewed:    Family Communication: no family at the bedside   Disposition: Status is: Inpatient Remains inpatient appropriate because: pending cardioversion   Planned Discharge Destination: Home    Author: Elidia Toribio Furnace, MD 02/12/2024 9:59 AM  For on call review [email or web address].

## 2024-02-12 NOTE — Plan of Care (Signed)
   Problem: Activity: Goal: Risk for activity intolerance will decrease Outcome: Progressing   Problem: Nutrition: Goal: Adequate nutrition will be maintained Outcome: Progressing   Problem: Coping: Goal: Level of anxiety will decrease Outcome: Progressing   Problem: Elimination: Goal: Will not experience complications related to bowel motility Outcome: Progressing Goal: Will not experience complications related to urinary retention Outcome: Progressing

## 2024-02-13 DIAGNOSIS — I255 Ischemic cardiomyopathy: Secondary | ICD-10-CM | POA: Diagnosis not present

## 2024-02-13 DIAGNOSIS — I4891 Unspecified atrial fibrillation: Secondary | ICD-10-CM | POA: Diagnosis not present

## 2024-02-13 DIAGNOSIS — I10 Essential (primary) hypertension: Secondary | ICD-10-CM | POA: Diagnosis not present

## 2024-02-13 DIAGNOSIS — I5023 Acute on chronic systolic (congestive) heart failure: Secondary | ICD-10-CM | POA: Diagnosis not present

## 2024-02-13 DIAGNOSIS — I2510 Atherosclerotic heart disease of native coronary artery without angina pectoris: Secondary | ICD-10-CM | POA: Diagnosis not present

## 2024-02-13 DIAGNOSIS — K559 Vascular disorder of intestine, unspecified: Secondary | ICD-10-CM | POA: Diagnosis not present

## 2024-02-13 LAB — HEPARIN LEVEL (UNFRACTIONATED)
Heparin Unfractionated: 0.23 [IU]/mL — ABNORMAL LOW (ref 0.30–0.70)
Heparin Unfractionated: 0.32 [IU]/mL (ref 0.30–0.70)

## 2024-02-13 LAB — CBC
HCT: 33.2 % — ABNORMAL LOW (ref 39.0–52.0)
Hemoglobin: 10.5 g/dL — ABNORMAL LOW (ref 13.0–17.0)
MCH: 28.2 pg (ref 26.0–34.0)
MCHC: 31.6 g/dL (ref 30.0–36.0)
MCV: 89.2 fL (ref 80.0–100.0)
Platelets: 649 K/uL — ABNORMAL HIGH (ref 150–400)
RBC: 3.72 MIL/uL — ABNORMAL LOW (ref 4.22–5.81)
RDW: 19.3 % — ABNORMAL HIGH (ref 11.5–15.5)
WBC: 5.5 K/uL (ref 4.0–10.5)
nRBC: 0 % (ref 0.0–0.2)

## 2024-02-13 LAB — COOXEMETRY PANEL
Carboxyhemoglobin: 1.9 % — ABNORMAL HIGH (ref 0.5–1.5)
Methemoglobin: <0.7 % (ref 0.0–1.5)
O2 Saturation: 72.4 %
Total hemoglobin: 10.9 g/dL — ABNORMAL LOW (ref 12.0–16.0)

## 2024-02-13 LAB — COMPREHENSIVE METABOLIC PANEL WITH GFR
ALT: 20 U/L (ref 0–44)
AST: 16 U/L (ref 15–41)
Albumin: 2.9 g/dL — ABNORMAL LOW (ref 3.5–5.0)
Alkaline Phosphatase: 70 U/L (ref 38–126)
Anion gap: 10 (ref 5–15)
BUN: 28 mg/dL — ABNORMAL HIGH (ref 8–23)
CO2: 22 mmol/L (ref 22–32)
Calcium: 8.8 mg/dL — ABNORMAL LOW (ref 8.9–10.3)
Chloride: 104 mmol/L (ref 98–111)
Creatinine, Ser: 1.43 mg/dL — ABNORMAL HIGH (ref 0.61–1.24)
GFR, Estimated: 51 mL/min — ABNORMAL LOW (ref >60)
Glucose, Bld: 141 mg/dL — ABNORMAL HIGH (ref 70–99)
Potassium: 4.1 mmol/L (ref 3.5–5.1)
Sodium: 136 mmol/L (ref 135–145)
Total Bilirubin: 0.7 mg/dL (ref 0.0–1.2)
Total Protein: 6.2 g/dL — ABNORMAL LOW (ref 6.5–8.1)

## 2024-02-13 LAB — GLUCOSE, CAPILLARY
Glucose-Capillary: 138 mg/dL — ABNORMAL HIGH (ref 70–99)
Glucose-Capillary: 174 mg/dL — ABNORMAL HIGH (ref 70–99)
Glucose-Capillary: 172 mg/dL — ABNORMAL HIGH (ref 70–99)
Glucose-Capillary: 165 mg/dL — ABNORMAL HIGH (ref 70–99)

## 2024-02-13 LAB — MAGNESIUM: Magnesium: 2.2 mg/dL (ref 1.7–2.4)

## 2024-02-13 MED ORDER — METOCLOPRAMIDE HCL 5 MG/ML IJ SOLN
5.0000 mg | Freq: Once | INTRAMUSCULAR | Status: AC
  Administered 2024-02-13: 5 mg via INTRAVENOUS
  Filled 2024-02-13: qty 2

## 2024-02-13 MED ORDER — METOPROLOL TARTRATE 5 MG/5ML IV SOLN
2.5000 mg | Freq: Once | INTRAVENOUS | Status: AC | PRN
  Administered 2024-02-13: 2.5 mg via INTRAVENOUS
  Filled 2024-02-13: qty 5

## 2024-02-13 MED ORDER — APIXABAN 5 MG PO TABS
5.0000 mg | ORAL_TABLET | Freq: Two times a day (BID) | ORAL | Status: DC
  Administered 2024-02-13 – 2024-02-25 (×25): 5 mg via ORAL
  Filled 2024-02-13 (×25): qty 1

## 2024-02-13 NOTE — Plan of Care (Signed)
  Problem: Education: Goal: Knowledge of General Education information will improve Description: Including pain rating scale, medication(s)/side effects and non-pharmacologic comfort measures Outcome: Progressing   Problem: Health Behavior/Discharge Planning: Goal: Ability to manage health-related needs will improve Outcome: Progressing   Problem: Clinical Measurements: Goal: Respiratory complications will improve Outcome: Progressing   Problem: Nutrition: Goal: Adequate nutrition will be maintained Outcome: Progressing   Problem: Coping: Goal: Level of anxiety will decrease Outcome: Progressing   Problem: Pain Managment: Goal: General experience of comfort will improve and/or be controlled Outcome: Progressing   Problem: Coping: Goal: Ability to adjust to condition or change in health will improve Outcome: Progressing   Problem: Health Behavior/Discharge Planning: Goal: Ability to manage health-related needs will improve Outcome: Progressing   Problem: Nutritional: Goal: Maintenance of adequate nutrition will improve Outcome: Progressing Goal: Progress toward achieving an optimal weight will improve Outcome: Progressing

## 2024-02-13 NOTE — Progress Notes (Signed)
 PHARMACY - ANTICOAGULATION CONSULT NOTE  Pharmacy Consult for heparin  infusion >> apixaban  Indication: atrial fibrillation  Allergies  Allergen Reactions   Codeine Nausea And Vomiting   Latex Rash    Oral blisters when used at dentist    Patient Measurements: Height: 6' (182.9 cm) Weight: 81.5 kg (179 lb 10.8 oz) IBW/kg (Calculated) : 77.6 HEPARIN  DW (KG): 91.1  Vital Signs: Temp: 98.7 F (37.1 C) (09/21 0456) Temp Source: Oral (09/21 0456) BP: 104/68 (09/21 0456) Pulse Rate: 99 (09/21 0456)  Labs: Recent Labs    02/11/24 0500 02/11/24 1227 02/11/24 2200 02/12/24 0500 02/13/24 0445  HGB 10.6*  --   --  11.1* 10.5*  HCT Chase.7*  --   --  35.6* Chase.2*  PLT 614*  --   --  725* 649*  HEPARINUNFRC  --    < > 0.30 0.34 0.23*  CREATININE 1.06  --   --  1.21 1.43*   < > = values in this interval not displayed.    Estimated Creatinine Clearance: 48.2 mL/min (A) (by C-G formula based on SCr of 1.43 mg/dL (H)).   Medical History: Past Medical History:  Diagnosis Date   Abdominal hernia    per pt   Arthritis    hands   Benign localized prostatic hyperplasia with lower urinary tract symptoms (LUTS)    CAD (coronary artery disease) 05/2004   cardiologist--- dr anner;  positive stress test , had outpt cardiac cath 05-27-2004 stenosis RI;  06-18-2004 cath w/ PCI and BMS x1 to pRI;   STEMI 02-16-2009  s/p cath w/ PCI thrombectomy for total occluded RCA , BMS x1 to pRCA;  nuclear stress test 03-15-2012 low risk no ischemia but sig inferior-inferolateral infarct , ef 51%   Dyslipidemia    Essential hypertension    Heart murmur    History of COVID-19 05/2020   per pt mild symptoms that resolved   History of ST elevation myocardial infarction (STEMI) 02/16/2009   inferior STEMI   cath w/ pci w/ BMS to pRCA   History of urinary retention    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[facility]) 04/2021   urologist--- dr borden/ radiation oncology-- dr patrcia;  dx 12/ 2022,  Gleason 4+5, PSA  24.6, volume 94.6cc;  plan to start IMRT   OSA (obstructive sleep apnea)    per pt dx approx 2008, no cpap intolerant   PONV (postoperative nausea and vomiting)    Pre-diabetes    S/p bare metal coronary artery stent    01/ 2006  x1 BMS to pRI ([employer] study stent);  and 09/ 2010  x1 BMS to pRCA    Medications:  Infusions:   heparin  1,200 Units/hr (02/13/24 0400)    Assessment: 78 yo Combs who presented with chest pain. Recently had coronary stent placed (August 2025), found to have in stent thrombosis. Thought to be clopidogrel  failure, transitioned to ticagrelor , clopidogrel  genetic testing in process. Developed a GIB. Last bloody bowel movement 9/10. PMH of Afib, HF. Pharmacy consulted for heparin  management.  Heparin  level 0.32 is therapeutic with heparin  running at 1200 units/hr. Hgb (10.5) and PLTs (649) are stable. Per RN, no report of pauses, issues with the line, or signs of bleeding.   Per Cardiology MD, to transition to apixaban  today.   Goal of Therapy:  Heparin  level 0.3-0.5 units/ml Monitor platelets by anticoagulation protocol: Yes   Plan:  Stop IV heparin  infusion Start apixaban  5 mg twice daily Monitor daily CBC and signs/symptoms of bleeding. Follow plans  for DCCV  Thank you for allowing pharmacy to be a part of this patient's care.   Nidia Schaffer, PharmD PGY2 Cardiology Pharmacy Resident  Please check AMION for all [facility] Pharmacy phone numbers After 10:00 PM, call Main Pharmacy [phone] 02/13/2024  7:43 AM

## 2024-02-13 NOTE — Plan of Care (Signed)
   Medical records reviewed including progress, labs and imaging. Patient is weaned off TPN, tolerating PO diet and pending cardioversion with further improvement of bowel function.  Goals of care are clear for full scope treatment. Patient and family have been encouraged to reach out for needs if they arise.   PMT will continue to follow peripherally. Thank you for your referral and allowing PMT to assist in [name]'s care.   Alfonso Shackett, PA-C Palliative Medicine Team  Team Phone # [phone]   NO CHARGE

## 2024-02-13 NOTE — Progress Notes (Signed)
 Progress Note  Patient Name: Chase Combs Date of Encounter: 02/13/2024 Primary Cardiologist: Alm Clay, MD  Covering for AHF team weekend of 02/12/24  Subjective   Overnight Hgb has improved. Patient notes No CP, SOB, Palpitations.  Eager to go home. Wife join us  via speaker phone.  Vital Signs    Vitals:   02/12/24 1933 02/12/24 2222 02/13/24 0456 02/13/24 0800  BP: 104/74 121/79 104/68 114/84  Pulse: 99 (!) 113 99 100  Resp: 19 20 20  (!) 21  Temp: 98.6 F (37 C) 98.2 F (36.8 C) 98.7 F (37.1 C) 98.1 F (36.7 C)  TempSrc: Oral Oral Oral Oral  SpO2: 96% 96% 95% 96%  Weight:   81.5 kg   Height:        Intake/Output Summary (Last 24 hours) at 02/13/2024 0844 Last data filed at 02/13/2024 0400 Gross per 24 hour  Intake 730.9 ml  Output 550 ml  Net 180.9 ml   Filed Weights   02/11/24 0336 02/12/24 0448 02/13/24 0456  Weight: 77.9 kg 80.5 kg 81.5 kg    Physical Exam   GEN: No acute distress.   Neck: No JVD Cardiac: iRRR, no murmurs, rubs, or gallops.  Respiratory: Clear to auscultation bilaterally. GI: Soft, nontender, non-distended  MS: No edema  Labs   Telemetry: AF largely rate controled with PVCs   Chemistry Recent Labs  Lab 02/11/24 0500 02/12/24 0500 02/13/24 0445  NA 135 137 136  K 3.8 3.6 4.1  CL 99 103 104  CO2 22 20* 22  GLUCOSE 143* 135* 141*  BUN 31* 26* 28*  CREATININE 1.06 1.21 1.43*  CALCIUM  8.6* 8.6* 8.8*  PROT 6.0* 6.4* 6.2*  ALBUMIN 2.7* 2.9* 2.9*  AST 21 22 16   ALT 23 24 20   ALKPHOS 78 80 70  BILITOT 0.4 0.8 0.7  GFRNONAA >60 >60 51*  ANIONGAP 14 14 10      Hematology Recent Labs  Lab 02/11/24 0500 02/12/24 0500 02/13/24 0445  WBC 5.3 6.1 5.5  RBC 3.81* 4.02* 3.72*  HGB 10.6* 11.1* 10.5*  HCT 33.7* 35.6* 33.2*  MCV 88.5 88.6 89.2  MCH 27.8 27.6 28.2  MCHC 31.5 31.2 31.6  RDW 19.4* 19.4* 19.3*  PLT 614* 725* 649*     Cardiac Studies   Cardiac Studies & Procedures    ______________________________________________________________________________________________ CARDIAC CATHETERIZATION  CARDIAC CATHETERIZATION 02/01/2024  Conclusion Images from the original result were not included. Coronary angiography and intervention 02/01/2024: LM: Distal 45% stenosis LAD: Ostially occluded stent with acute stent thrombosis (Culprit vessel) Ramus: 50% in-stent restenosis Lcx: Mild diffuse disease RCA: Not engaged today  LVEDP 27 mmHg  Successful percutaneous coronary intervention ostial prox LAD PTCA with serial balloon dilatation, up to 3.0  X 12 mm [place] balloon up to 20 atm    Manish JINNY Lawrence, MD  Findings Coronary Findings Diagnostic  Dominance: Right  Left Main Vessel is large. Mid LM to Dist LM lesion is 45% stenosed. The lesion is severely calcified.  Left Anterior Descending Vessel is moderate in size. Ost LAD to Prox LAD lesion is 100% stenosed. The lesion was previously treated .  First Diagonal Branch Vessel is small in size.  Second Diagonal Branch Vessel is small in size.  Third Diagonal Branch Vessel is small in size.  Ramus Intermedius Vessel is moderate in size. Ramus lesion is 50% stenosed. The lesion was previously treated .  Left Circumflex Vessel is large. The vessel exhibits minimal luminal irregularities.  First Biomedical engineer  Vessel is small in size.  Second Obtuse Marginal Branch Vessel is large in size.  Third Obtuse Marginal Branch Vessel is small in size.  Right Coronary Artery Vessel is large. Prox RCA lesion is 45% stenosed. The lesion was previously treated . Prox RCA to Mid RCA lesion is 40% stenosed.  Right Posterior Descending Artery Vessel is large in size.  Right Posterior Atrioventricular Artery Vessel is moderate in size.  First Right Posterolateral Branch Vessel is small in size.  Second Right Posterolateral Branch Vessel is small in size.  Third Right Posterolateral  Branch Vessel is small in size.  Intervention  Ost LAD to Prox LAD lesion Angioplasty CATH LAUNCHER 6FR EBU 3.75 guide catheter was inserted. WIRE HI TORQ WIGGLE 190CM guidewire used to cross lesion. Balloon angioplasty was performed using a BALLOON TAKERU 1.5X6. Maximum pressure: 12 atm. Inflation time: 15 sec.  A second ballloon was used, using a standard  BALLOON EMERGE MR 2.0X8. Maximum pressure:  8 atm. Inflation time:  15 sec.  A third ballloon was used, using a semi-compliant BALLOON [place] EUPHORA RX 3.0X12. Maximum pressure:  20 atm.  Inflation time:  15 sec. A standard balloon was used in the side branch. Post-Intervention Lesion Assessment The intervention was successful. Pre-interventional TIMI flow is 0. Post-intervention TIMI flow is 3. Ultrasound (IVUS) was performed on the lesion post PCI. There is a 0% residual stenosis post intervention.   CARDIAC CATHETERIZATION  CARDIAC CATHETERIZATION 01/17/2024  Conclusion Conclusions: Severe single-vessel coronary artery disease with 70-80% proximal LAD stenosis that is highly significant by hemodynamic assessment (RFR = 0.67).  Moderate LMCA disease (MLA 6.5 mm) and 40% proximal/mid RCA [place]ovo disease also noted. Patent ramus intermedius stent with 50% in-stent restenosis. Patent proximal RCA stent with 40-50% in-stent restenosis. Upper normal left heart filling pressures (PCWP and LVEDP 15 mmHg). Mildly elevated right heart and pulmonary artery pressures (mean RA 7, RV 47/7, mean PA 25 mmHg). Normal Fick cardiac output/index (CO 6.7 L/min, CI 3.1 L/min/m). Successful IVUS-guided PCI to ostial/proximal LAD using Synergy XD 3.0 x 20 mm drug-eluting stent with 0% residual stenosis and TIMI-3 flow.  Recommendations: Dual antiplatelet therapy with aspirin  and ticagrelor  for at least 12 months. Aggressive secondary prevention of coronary artery disease. Maintain net even to slightly negative fluid balance. Continue escalation of  goal-directed medical therapy for mixed ischemic and nonischemic cardiomyopathy. Keep at [facility] for ongoing management of CAD/CHF as well as pneumonia/bronchitis.  Lonni Hanson, MD [facility]  Findings Coronary Findings Diagnostic  Dominance: Right  Left Main Vessel is large. Mid LM to Dist LM lesion is 45% stenosed. The lesion is severely calcified. Ultrasound (IVUS) was performed. Cross-sectional area: 6.5 mm.  Left Anterior Descending Vessel is moderate in size. Ost LAD to Prox LAD lesion is 75% stenosed. Pressure gradient was performed on the lesion using a GUIDEWIRE PRESSURE X 175 and CATH VISTA GUIDE 6FR XBLD 3.5. RFR: 0.67. Ultrasound (IVUS) was performed. Severe plaque burden was detected. IVUS has determined that the lesion is calcified.  First Diagonal Branch Vessel is small in size.  Second Diagonal Branch Vessel is small in size.  Third Diagonal Branch Vessel is small in size.  Ramus Intermedius Vessel is moderate in size. Ramus lesion is 50% stenosed. The lesion was previously treated .  Left Circumflex Vessel is large. The vessel exhibits minimal luminal irregularities.  First Obtuse Marginal Branch Vessel is small in size.  Second Obtuse Marginal Branch Vessel is large in size.  Third Obtuse Marginal  Branch Vessel is small in size.  Right Coronary Artery Vessel is large. Prox RCA lesion is 45% stenosed. The lesion was previously treated . Prox RCA to Mid RCA lesion is 40% stenosed.  Right Posterior Descending Artery Vessel is large in size.  Right Posterior Atrioventricular Artery Vessel is moderate in size.  First Right Posterolateral Branch Vessel is small in size.  Second Right Posterolateral Branch Vessel is small in size.  Third Right Posterolateral Branch Vessel is small in size.  Intervention  Ost LAD to Prox LAD lesion Stent CATH VISTA GUIDE 6FR XBLD 3.5 guide catheter was inserted. Lesion crossed with guidewire  using a GUIDEWIRE PRESSURE X 175. Pre-stent angioplasty was performed using a BALLOON EMERGE MR 2.5X15. Maximum pressure:  12 atm.  A second angioplasty balloon was used, using a BALLOON SCOREFLEX 2.75X10. Maximum pressure:  16 atm. A drug-eluting stent was successfully placed using a STENT SYNERGY XD 3.0X20. Maximum pressure: 11 atm. Stent strut is well apposed. Post-stent angioplasty was performed using a BALLOON [place] EUPHORA RX 3.0X15. Maximum pressure:  18 atm. Post-Intervention Lesion Assessment The intervention was successful. Pre-interventional TIMI flow is 3. Post-intervention TIMI flow is 3. No complications occurred at this lesion. There is a 0% residual stenosis post intervention.   STRESS TESTS  MYOCARDIAL PERFUSION IMAGING 07/12/2023  Interpretation Summary   Findings are consistent with infarction. The study is high risk.   No ST deviation was noted.   LV perfusion is abnormal. There is no evidence of ischemia. There is evidence of infarction. Defect 1: There is a medium defect with moderate reduction in uptake present in the apical to basal inferior and apex location(s) that is fixed. There is abnormal wall motion in the defect area. Consistent with infarction.   Left ventricular function is abnormal. Nuclear stress EF: 31%. The left ventricular ejection fraction is moderately decreased (30-44%). End diastolic cavity size is severely enlarged. End systolic cavity size is severely enlarged.   Prior study available for comparison from 03/05/2012.  Fixed inferior perfusion defect with abnormal wall motion consistent with infarct LVEF 31% High risk study due to systolic dysfunction.  No ischemia   ECHOCARDIOGRAM  ECHOCARDIOGRAM COMPLETE 02/02/2024  Narrative ECHOCARDIOGRAM REPORT    Patient Name:   Chase Combs Date of Exam: 02/02/2024 Medical Rec #:  [id number]      Height:       72.0 in Accession #:    [id number]     Weight:       226.0 lb Date of Birth:  06/16/1945      BSA:           2.244 m Patient Age:    78 years       BP:           102/69 mmHg Patient Gender: M              HR:           102 bpm. Exam Location:  Inpatient  Procedure: 2D Echo, Cardiac Doppler and Color Doppler (Both Spectral and Color Flow Doppler were utilized during procedure).  Indications:    Congestive Heart Failure I50.9  History:        Patient has prior history of Echocardiogram examinations, most recent 01/13/2024. CAD; Risk Factors:Hypertension and Sleep Apnea.  Sonographer:    Jayson Gaskins Referring Phys: [phone] AMY D CLEGG  IMPRESSIONS   1. Left ventricular ejection fraction, by estimation, is 20 to 25%. The left ventricle has severely decreased function.  The left ventricle demonstrates global hypokinesis. There is moderate concentric left ventricular hypertrophy. Indeterminate diastolic filling due to E-A fusion. There is abnormal (paradoxical) septal motion, consistent with right ventricular volume overload. 2. Right ventricular systolic function is moderately reduced. The right ventricular size is mildly enlarged. Tricuspid regurgitation signal is inadequate for assessing PA pressure. The estimated right ventricular systolic pressure is 30.7 mmHg. 3. The mitral valve is degenerative. Moderate mitral valve regurgitation. 4. Tricuspid valve regurgitation is mild to moderate. 5. The aortic valve is tricuspid. Aortic valve regurgitation is trivial. Aortic valve sclerosis is present, with no evidence of aortic valve stenosis. 6. The inferior vena cava is dilated in size with <50% respiratory variability, suggesting right atrial pressure of 15 mmHg.  Comparison(s): Changes from prior study are noted. 01/13/2024: LVEF 25-30%, moderate RV systolic dysfunction.  FINDINGS Left Ventricle: Left ventricular ejection fraction, by estimation, is 20 to 25%. The left ventricle has severely decreased function. The left ventricle demonstrates global hypokinesis. The left ventricular internal  cavity size was normal in size. There is moderate concentric left ventricular hypertrophy. Abnormal (paradoxical) septal motion, consistent with right ventricular volume overload. Indeterminate diastolic filling due to E-A fusion.  Right Ventricle: The right ventricular size is mildly enlarged. No increase in right ventricular wall thickness. Right ventricular systolic function is moderately reduced. Tricuspid regurgitation signal is inadequate for assessing PA pressure. The tricuspid regurgitant velocity is 1.98 m/s, and with an assumed right atrial pressure of 15 mmHg, the estimated right ventricular systolic pressure is 30.7 mmHg.  Left Atrium: Left atrial size was normal in size.  Right Atrium: Right atrial size was normal in size.  Pericardium: There is no evidence of pericardial effusion.  Mitral Valve: The mitral valve is degenerative in appearance. Moderate mitral valve regurgitation.  Tricuspid Valve: The tricuspid valve is normal in structure. Tricuspid valve regurgitation is mild to moderate.  Aortic Valve: The aortic valve is tricuspid. Aortic valve regurgitation is trivial. Aortic valve sclerosis is present, with no evidence of aortic valve stenosis. Aortic valve mean gradient measures 5.0 mmHg. Aortic valve peak gradient measures 8.0 mmHg. Aortic valve area, by VTI measures 2.17 cm.  Pulmonic Valve: The pulmonic valve was normal in structure. Pulmonic valve regurgitation is trivial.  Aorta: The aortic root is normal in size and structure.  Venous: The inferior vena cava is dilated in size with less than 50% respiratory variability, suggesting right atrial pressure of 15 mmHg.  IAS/Shunts: No atrial level shunt detected by color flow Doppler.   LEFT VENTRICLE PLAX 2D LVIDd:         6.10 cm LVIDs:         5.70 cm LV PW:         1.20 cm LV IVS:        1.30 cm LVOT diam:     2.00 cm LV SV:         47 LV SV Index:   21 LVOT Area:     3.14 cm   RIGHT VENTRICLE RV S  prime:     14.00 cm/s TAPSE (M-mode): 1.8 cm  LEFT ATRIUM             Index        RIGHT ATRIUM           Index LA Vol (A2C):   93.1 ml 41.49 ml/m  RA Area:     15.90 cm LA Vol (A4C):   30.5 ml 13.59 ml/m  RA Volume:   40.70  ml  18.14 ml/m LA Biplane Vol: 56.5 ml 25.18 ml/m AORTIC VALVE AV Area (Vmax):    2.12 cm AV Area (Vmean):   1.99 cm AV Area (VTI):     2.17 cm AV Vmax:           141.00 cm/s AV Vmean:          101.000 cm/s AV VTI:            0.216 m AV Peak Grad:      8.0 mmHg AV Mean Grad:      5.0 mmHg LVOT Vmax:         95.00 cm/s LVOT Vmean:        63.900 cm/s LVOT VTI:          0.149 m LVOT/AV VTI ratio: 0.69  AORTA Ao Root diam: 2.90 cm  MITRAL VALVE               TRICUSPID VALVE MV Area (PHT): 2.93 cm    TR Peak grad:   15.7 mmHg MV Decel Time: 259 msec    TR Vmax:        198.00 cm/s MV E velocity: 87.50 cm/s SHUNTS Systemic VTI:  0.15 m Systemic Diam: 2.00 cm  Vinie Maxcy MD Electronically signed by Vinie Maxcy MD Signature Date/Time: 02/02/2024/3:33:36 PM    Final          ______________________________________________________________________________________________      Assessment & Plan   CAD  - with Recent PCI 8/25 and Stent thrombsis 9/9 - on ticagrelor  with plavix  genetic testing pending; in review with Dr. 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 - complicated by fungal rash on SGLT2i and hypotension and inability to take ARB/ARNI (home med) - NYHA IV but minimally active - Continue digoxin   now off milrinone  - increase in creatinine and appears euvolemic, I have held MRA and Torsemide  today, MRA likely returned tomorrow  Anemia and GI bleed - for this reason patient is now on triple therapy - Hgb stable; planned for conservative w/u  New AF - on amiodarone  PO and digoxin , tolerating heparin  - AHF team plans DCCV at  some point next week (Monday or Tuesday potentially)  transitioning to eliquis  today   For questions or updates, please contact [facility] Please consult [email or web address] for contact info under Cardiology/STEMI.      Stanly Leavens, MD FASE [facility] Cardiologist [facility]  [address] [place], [address] [place], [place] [address] [phone]  8:44 AM

## 2024-02-13 NOTE — Progress Notes (Addendum)
 Progress Note   Patient: Chase Combs FMW:[id number] DOB: 03-04-1946 DOA: 01/22/2024     22 DOS: the patient was seen and examined on 02/13/2024   Brief hospital course: [name] was admitted to the hospital with the working diagnosis of ileitis/ ischemic colitis.   78 year old with history of ischemic cardiomyopathy/LAD and heart failure  admitted to the hospital for nausea and vomiting.  Recent hospitalization 08/15 tp 01/19/24 for heart failure, he was diagnosed with severe proximal LAD stenosis which was stented with drug eluding stent. Discharged on dual antiplatelet therapy, goal directed medical therapy for heart failure and 20 mg furosemide  for diuresis.  At home patient had progressive nausea and vomiting, not able to maintain oral medications including antiplatelet therapy.  [name] he was hypotensive with systolic blood pressure 94/51, HR 50, RR 23 and 02 saturation 94% temp 103.5  Lungs with increased work of breathing with no wheezing, heart with S1 and S2 present and tachycardic, abdomen with diffuse tenderness to palpation in the lower quadrants more right than left, no lower extremity edema.   Na 132, K 4.5 Cl 81 bicarbonate 31, glucose 197 bun 64 cr 2,55 AST 29 ALT 31  BNP 220  High sensitive troponin 126 and 108  Lactic acid 3,4  Wbc 2.4 hgb 13.9 plt 489  Urine analysis SG 1,020, protein negative, negative leukocytes and negative hgb  C diff negative   Chest radiograph with hypoinflation, positive cardiomegaly, bilateral basal atelectasis and small bilateral pleural effusions.   EKG 120 bpm, left axis deviation, qtc 501, sinus rhythm with multiple PVC (right bundle branch morphology), with no significant ST segment or T wave changes, (noisy baseline)   CT chest abdomen and pelvis. Bilateral faint ground glass opacities,  Gas along the wall of the ascending colon and cecum, suspicious for pneumoatosis.  Fluid filled colon suggests a  diarrheal state. Diffuse colonic and small bowel distention likely represent ileus.  Increased small volume pelvic and new right sided pericolonic trace fluid, likely secondary to colonic process.  Right nephrolithiasis Suspicion for minimal calcific pancreatitis.   General surgery recommended conservative management.   Concern patient not able to absorb antiplatelet therapy, he was placed on IV cangrelor .  Patient continue to be hypotensive despite IV fluids and was placed on norepinephrine  infusion, consistent with septic shock due to abdominal source/ ischemic colitis.   During hospitalization required PRBC transfusion due to blood loss anemia.   There was concerns of in-stent thrombosis from recent PCI therefore antiplatelets were switched from Cangrelor  to Plavix  and started on milrinone  drip due to cardiogenic shock.   Developed atrial fibrillation with rapid ventricular response, placed on IV amiodarone .  Transition to Aggrastat for anticoagulation.  09/05 GI consulted, clinical picture consistent with colonic ileus, Ogilvie syndrome.  09/08 PRBC transfusion x1, transitioned to clopidogrel .  09/09 chest pain with new antero lateral ST elevations. Underwent urgent cardiac catheterization, LAD with ostially occluded stent with acute stent thrombosis (Culprit vessel). Successful percutaneous coronary intervention ostial proximal LAD, PTCA with balloon dilatation.  Significant volume overloaded with low SV02 and CVP very high, placed on IV furosemide  and milrinone .  09/10 completed antibiotic course.  09/10 EGD with esophageal ulcer with no bleeding and no stigmata of recent bleeding.  LA grade C reflux esophagitis with no bleeding.  Duodenal erosions without bleeding.  Flexible sigmoidoscopy positive blood in the rectum, in the sigmoid colon, in the descending colon and in the transverse colon. No obvious source of bleed.  Diverticulosis in the sigmoid colon and in the descending colon. No  active bleeding.  A single superficial ulcer in the rectum. No active bleeding.  09/12 transition to ticagrelor .  Slowly weaned off TPN, diet as tolerated.   09/19 continue colonic pseudo obstruction, imaging with progression of colonic distention. Started on rifaximin .  Pending cardioversion until bowel function improves.  09/21 pending direct current cardioversion.    Assessment and Plan: * Intestinal ischemia (HCC) Intestinal pneumatosis/ colonic adynamic ileus  Pseudo obstruction.  Complicated with sepsis, present on admission.  Completed antibiotic therapy on 09/10 TPN has been weaned off.   Trial of rifaximin  550 mg po tid.  Continue supportive care. Tolerating po well.   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 Diuresis with torsemide .   Coronary artery disease involving native coronary artery of native heart without angina pectoris Recent STEMI with stent thrombosis.  Continue with ticagrelor , holding asa due to GI bleeding.  Continue statin.   Essential hypertension Continue blood pressure monitoring Diuresis with torsemide  and spironolactone .   Atrial fibrillation with RVR (HCC) Telemetry with atrial flutter with variable block, with rate 100 bpm.  Rate control with amiodarone , continue anticoagulation with IV heparin  Pending cardioversion when GI status has improved.   AKI (acute kidney injury) (HCC) Hyponatremia, hypokalemia.   Renal function with serum cr at 1,43 with K at 4.1 and serum bicarbonate at 22  Na 136 and Mg 2.2    Continue close follow up renal function and electrolytes Continue diuresis with  low dose torsemide   Anemia due to acute blood loss GI bleed.   SP 4 units PRBC transfusion.   EGD with flexible sigmoidoscopy with red blood in the colon.  Grade 1 esophagitis.  Continue pantoprazole .  Follow up hgb is 11.1   Malnutrition of moderate degree Continue nutritional supplements.   Type 2 diabetes mellitus with hyperlipidemia (HCC) Continue insulin  sliding scale for glucose cover and monitoring        Subjective: Patient had mild nausea yesterday but not this morning, no chest pain and no dyspnea, no palpitations   Physical Exam: Vitals:   02/12/24 1933 02/12/24 2222 02/13/24 0456 02/13/24 0800  BP: 104/74 121/79 104/68 114/84  Pulse: 99 (!) 113 99 100  Resp: 19 20 20  (!) 21  Temp: 98.6 F (37 C) 98.2 F (36.8 C) 98.7 F (37.1 C) 98.1 F (36.7 C)  TempSrc: Oral Oral Oral Oral  SpO2: 96% 96% 95% 96%  Weight:   81.5 kg   Height:       Neurology awake and alert ENT with mild pallor Cardiovascular with S1 and S2 present, irregular with no gallops or rubs, positive systolic murmur at the left lower sternal border Respiratory with no rales or wheezing, no rhonchi  Abdomen is distended but soft, non tender, tympanic to percussion Lower extremity edema is trace ted hose in place  Data Reviewed:   Family Communication: no family at the bedside   Disposition: Status is: Inpatient Remains inpatient appropriate because: possible inpatient cardioversion   Planned Discharge Destination: Home     Author: Elidia Toribio Furnace, MD  02/13/2024 9:28 AM  For on call review [email or web address].

## 2024-02-13 NOTE — Progress Notes (Signed)
 HR 120's Afib, consistently.  Dr. Charlton notified.  Orders received for metoprolol  2.5 mg.

## 2024-02-13 NOTE — Progress Notes (Signed)
 [facility] ROUNDING NOTE   Subjective: Patient reports decreased appetite, eating only about 30% of his meals.  Denies abdominal pain, nausea or vomiting.  Just reports decreased interest in food.  Also mentions little dysphagia, which he says is a chronic problem for him.  Continues to have loose watery stools, usually 2/day.  No meaningful change since starting rifaximin .  Patient frustrated by lack of progress, wants to go home.   Objective: Vital signs in last 24 hours: Temp:  [97.8 F (36.6 C)-98.7 F (37.1 C)] 97.9 F (36.6 C) (09/21 1453) Pulse Rate:  [82-121] 121 (09/21 1453) Resp:  [18-23] 20 (09/21 1453) BP: (102-122)/(66-85) 122/85 (09/21 1453) SpO2:  [95 %-98 %] 98 % (09/21 1453) Weight:  [81.5 kg] 81.5 kg (09/21 0456) Last BM Date : 02/13/24 General: NAD, Caucasian male Lungs:  CTA b/l, no w/r/r Heart: Tachycardic, no m/r/g Abdomen:  Soft, but more distended and a little more tense today.  Nontender.  Bowel sounds active, but tinkling in quality Ext:  No c/c/e    Intake/Output from previous day: 09/20 0701 - 09/21 0700 In: 1280.4 [P.O.:600; I.V.:680.4] Out: 550 [Urine:550] Intake/Output this shift: Total I/O In: 155.4 [P.O.:120; I.V.:35.4] Out: -    Lab Results: Recent Labs    02/11/24 0500 02/12/24 0500 02/13/24 0445  WBC 5.3 6.1 5.5  HGB 10.6* 11.1* 10.5*  PLT 614* 725* 649*  MCV 88.5 88.6 89.2   BMET Recent Labs    02/11/24 0500 02/12/24 0500 02/13/24 0445  NA 135 137 136  K 3.8 3.6 4.1  CL 99 103 104  CO2 22 20* 22  GLUCOSE 143* 135* 141*  BUN 31* 26* 28*  CREATININE 1.06 1.21 1.43*  CALCIUM  8.6* 8.6* 8.8*   LFT Recent Labs    02/11/24 0500 02/12/24 0500 02/13/24 0445  PROT 6.0* 6.4* 6.2*  ALBUMIN 2.7* 2.9* 2.9*  AST 21 22 16   ALT 23 24 20   ALKPHOS 78 80 70  BILITOT 0.4 0.8 0.7   PT/INR No results for input(s): INR in the last 72 hours.    Imaging/Other results: No results found.    Assessment and  Plan:  78 year old male with complicated recent medical history to include coronary artery disease with stent placement and subsequent in-stent stenosis, acute systolic heart failure with hypervolemia, A-fib with RVR, admitted with clinical presentation suggestive of ischemic colitis/intestinal pneumatosis, with ongoing issues of colonic pseudoobstruction.  He also experienced GI bleed earlier in this admission prompting cessation of antiplatelet agents.   Colonic pseudoobstruction, subacute Patient remains minimally symptomatic in terms of discomfort, although today he does seem a little more distended.  Tolerating regular diet but appetite not great and passing liquid stools daily.  Imaging shows no progression of his colonic distention over 2 weeks Hopefully, this will resolve with increased physical activity and improvement in comorbidities.  We again discussed potentially adding neostigmine , but given his lack of significant symptoms and relative contraindications (CAD, recent MI), we we will continue to hold off on this for now. - Continue to maintain normal electrolytes, avoid narcotics/antihistamines/sedatives - Anticipate colonic pseudoobstruction will eventually resolve with improvement in mobility/acute cardiac issues - If worsening over the course of next week, we will reconsider neostigmine  versus endoscopic decompression    Diarrhea, hypokalemia Patient has had issues with diarrhea over the past few months going along with his colonic pseudoobstruction.  There is suspicion he may have SIBO, and was previously given Flagyl , which worked well in May, but did not have much  of an effect when he was given it this admission.  He continues to have issues with hypokalemia, which may be affecting his cardiac recovery. - Rifaximin  550 mg p.o. 3 times daily for 14 days (started 9/19) -So far no improvement with rifaximin  - See above regarding potential neostigmine  versus endoscopic decompression  next week   GI bleed/acute blood loss anemia Etiology unclear, but may have been diverticular.  He has not had any bleeding since Sept 11 and his hemoglobin has been stable.  Currently on Brilinta , aspirin  being held.   - Heparin  drip started 9/19 -On Brilinta , aspirin  being held   Coronary artery disease status post stent with subsequent in-stent stenosis - On Brilinta    Systolic heart failure/cardiogenic shock, EF 20-25% - On digoxin , torsemide , off milrinone  drip   A-fib with RVR - On amiodarone , heparin  drip -- Planning for cardioversion next week  Dr. Albertus will be taking over inpatient GI tomorrow    [name], MD  02/13/2024, 3:20 PM [facility]

## 2024-02-13 NOTE — Plan of Care (Signed)
  Problem: Clinical Measurements: Goal: Ability to maintain clinical measurements within normal limits will improve Outcome: Progressing Goal: Will remain free from infection Outcome: Progressing Goal: Diagnostic test results will improve Outcome: Progressing Goal: Respiratory complications will improve Outcome: Progressing Goal: Cardiovascular complication will be avoided Outcome: Progressing   Problem: Activity: Goal: Risk for activity intolerance will decrease Outcome: Progressing   Problem: Coping: Goal: Level of anxiety will decrease Outcome: Progressing   Problem: Elimination: Goal: Will not experience complications related to bowel motility Outcome: Progressing Goal: Will not experience complications related to urinary retention Outcome: Progressing   Problem: Pain Managment: Goal: General experience of comfort will improve and/or be controlled Outcome: Progressing   Problem: Safety: Goal: Ability to remain free from injury will improve Outcome: Progressing   Problem: Skin Integrity: Goal: Risk for impaired skin integrity will decrease Outcome: Progressing   Problem: Fluid Volume: Goal: Ability to maintain a balanced intake and output will improve Outcome: Progressing   Problem: Nutritional: Goal: Maintenance of adequate nutrition will improve Outcome: Progressing   Problem: Skin Integrity: Goal: Risk for impaired skin integrity will decrease Outcome: Progressing   Problem: Tissue Perfusion: Goal: Adequacy of tissue perfusion will improve Outcome: Progressing

## 2024-02-14 ENCOUNTER — Inpatient Hospital Stay (HOSPITAL_COMMUNITY)

## 2024-02-14 DIAGNOSIS — I2510 Atherosclerotic heart disease of native coronary artery without angina pectoris: Secondary | ICD-10-CM | POA: Diagnosis not present

## 2024-02-14 DIAGNOSIS — I10 Essential (primary) hypertension: Secondary | ICD-10-CM | POA: Diagnosis not present

## 2024-02-14 DIAGNOSIS — R197 Diarrhea, unspecified: Secondary | ICD-10-CM | POA: Diagnosis not present

## 2024-02-14 DIAGNOSIS — K55059 Acute (reversible) ischemia of intestine, part and extent unspecified: Secondary | ICD-10-CM | POA: Diagnosis not present

## 2024-02-14 DIAGNOSIS — K559 Vascular disorder of intestine, unspecified: Secondary | ICD-10-CM | POA: Diagnosis not present

## 2024-02-14 DIAGNOSIS — I5023 Acute on chronic systolic (congestive) heart failure: Secondary | ICD-10-CM | POA: Diagnosis not present

## 2024-02-14 DIAGNOSIS — I4891 Unspecified atrial fibrillation: Secondary | ICD-10-CM | POA: Diagnosis not present

## 2024-02-14 LAB — COOXEMETRY PANEL
Carboxyhemoglobin: 1.2 % (ref 0.5–1.5)
Carboxyhemoglobin: 2.2 % — ABNORMAL HIGH (ref 0.5–1.5)
Methemoglobin: <0.7 % (ref 0.0–1.5)
Methemoglobin: 0.7 % (ref 0.0–1.5)
O2 Saturation: 42.4 %
O2 Saturation: 60.1 %
Total hemoglobin: 11.8 g/dL — ABNORMAL LOW (ref 12.0–16.0)
Total hemoglobin: 11.6 g/dL — ABNORMAL LOW (ref 12.0–16.0)

## 2024-02-14 LAB — CBC
HCT: 35.7 % — ABNORMAL LOW (ref 39.0–52.0)
Hemoglobin: 11.3 g/dL — ABNORMAL LOW (ref 13.0–17.0)
MCH: 28.3 pg (ref 26.0–34.0)
MCHC: 31.7 g/dL (ref 30.0–36.0)
MCV: 89.3 fL (ref 80.0–100.0)
Platelets: 723 K/uL — ABNORMAL HIGH (ref 150–400)
RBC: 4 MIL/uL — ABNORMAL LOW (ref 4.22–5.81)
RDW: 19.7 % — ABNORMAL HIGH (ref 11.5–15.5)
WBC: 6.2 K/uL (ref 4.0–10.5)
nRBC: 0 % (ref 0.0–0.2)

## 2024-02-14 LAB — GLUCOSE, CAPILLARY
Glucose-Capillary: 160 mg/dL — ABNORMAL HIGH (ref 70–99)
Glucose-Capillary: 187 mg/dL — ABNORMAL HIGH (ref 70–99)
Glucose-Capillary: 137 mg/dL — ABNORMAL HIGH (ref 70–99)
Glucose-Capillary: 137 mg/dL — ABNORMAL HIGH (ref 70–99)

## 2024-02-14 LAB — COMPREHENSIVE METABOLIC PANEL WITH GFR
ALT: 20 U/L (ref 0–44)
AST: 17 U/L (ref 15–41)
Albumin: 3.1 g/dL — ABNORMAL LOW (ref 3.5–5.0)
Alkaline Phosphatase: 79 U/L (ref 38–126)
Anion gap: 10 (ref 5–15)
BUN: 30 mg/dL — ABNORMAL HIGH (ref 8–23)
CO2: 22 mmol/L (ref 22–32)
Calcium: 9.2 mg/dL (ref 8.9–10.3)
Chloride: 103 mmol/L (ref 98–111)
Creatinine, Ser: 1.45 mg/dL — ABNORMAL HIGH (ref 0.61–1.24)
GFR, Estimated: 50 mL/min — ABNORMAL LOW (ref >60)
Glucose, Bld: 144 mg/dL — ABNORMAL HIGH (ref 70–99)
Potassium: 4.2 mmol/L (ref 3.5–5.1)
Sodium: 135 mmol/L (ref 135–145)
Total Bilirubin: 0.4 mg/dL (ref 0.0–1.2)
Total Protein: 6.6 g/dL (ref 6.5–8.1)

## 2024-02-14 LAB — MAGNESIUM: Magnesium: 2.4 mg/dL (ref 1.7–2.4)

## 2024-02-14 MED ORDER — TORSEMIDE 20 MG PO TABS
20.0000 mg | ORAL_TABLET | Freq: Every day | ORAL | Status: DC
  Administered 2024-02-14 – 2024-02-17 (×4): 20 mg via ORAL
  Filled 2024-02-14 (×4): qty 1

## 2024-02-14 MED ORDER — AMIODARONE HCL IN DEXTROSE 360-4.14 MG/200ML-% IV SOLN
30.0000 mg/h | INTRAVENOUS | Status: DC
  Administered 2024-02-14 – 2024-02-22 (×13): 30 mg/h via INTRAVENOUS
  Filled 2024-02-14 (×14): qty 200

## 2024-02-14 MED ORDER — METOCLOPRAMIDE HCL 5 MG/ML IJ SOLN
5.0000 mg | Freq: Three times a day (TID) | INTRAMUSCULAR | Status: DC | PRN
  Administered 2024-02-14 – 2024-02-20 (×4): 5 mg via INTRAVENOUS
  Filled 2024-02-14 (×4): qty 2

## 2024-02-14 MED ORDER — AMIODARONE HCL IN DEXTROSE 360-4.14 MG/200ML-% IV SOLN
60.0000 mg/h | INTRAVENOUS | Status: AC
  Administered 2024-02-14: 60 mg/h via INTRAVENOUS
  Filled 2024-02-14: qty 200

## 2024-02-14 MED ORDER — OXYCODONE HCL 5 MG PO TABS
5.0000 mg | ORAL_TABLET | Freq: Once | ORAL | Status: DC | PRN
Start: 2024-02-14 — End: 2024-02-15

## 2024-02-14 MED ORDER — METOPROLOL TARTRATE 5 MG/5ML IV SOLN
2.5000 mg | Freq: Once | INTRAVENOUS | Status: AC | PRN
  Administered 2024-02-14: 2.5 mg via INTRAVENOUS

## 2024-02-14 MED ORDER — SPIRONOLACTONE 25 MG PO TABS
25.0000 mg | ORAL_TABLET | Freq: Every day | ORAL | Status: DC
  Administered 2024-02-14 – 2024-02-25 (×12): 25 mg via ORAL
  Filled 2024-02-14 (×12): qty 1

## 2024-02-14 NOTE — Plan of Care (Signed)
  Problem: Education: Goal: Knowledge of General Education information will improve Description: Including pain rating scale, medication(s)/side effects and non-pharmacologic comfort measures Outcome: Progressing   Problem: Health Behavior/Discharge Planning: Goal: Ability to manage health-related needs will improve Outcome: Progressing   Problem: Clinical Measurements: Goal: Respiratory complications will improve Outcome: Progressing   Problem: Nutrition: Goal: Adequate nutrition will be maintained Outcome: Progressing   Problem: Elimination: Goal: Will not experience complications related to bowel motility Outcome: Progressing Goal: Will not experience complications related to urinary retention Outcome: Progressing   Problem: Pain Managment: Goal: General experience of comfort will improve and/or be controlled Outcome: Progressing   Problem: Safety: Goal: Ability to remain free from injury will improve Outcome: Progressing   Problem: Skin Integrity: Goal: Risk for impaired skin integrity will decrease Outcome: Progressing

## 2024-02-14 NOTE — Progress Notes (Signed)
 Occupational Therapy Treatment Patient Details Name: Chase Combs MRN: [id number] DOB: 20-Jul-1945 Today's Date: 02/14/2024   History of present illness 78 year old man admitted 8/30 with progressive nausea vomiting with CT images concerning for ischemic colitis; pneumatosis intestinalis/ileus. 9/9  recurrent STEMI with in-stent stenosis.[name]  PMH: ischemic cardiomyopathy and very recent LAD stent placement for NSTEMI discharged 8/27.   OT comments  Pt at this time reported they complete ADLs this AM but willing to work with progression endurance with therapy. Pt however, was unaware of loose BM and needed max-total assist with peri care as reporting BUE support with completion even with cued on how to modify. He then completed ambulation with CGA x2 with chair follow as will want to sit quickly. Patient will benefit from intensive inpatient follow-up therapy, >3 hours/day.      If plan is discharge home, recommend the following:  A little help with walking and/or transfers;A little help with bathing/dressing/bathroom;Assistance with cooking/housework;Assist for transportation;Help with stairs or ramp for entrance   Equipment Recommendations  Other (comment) (TBD at next venue but not reporting DME needs if to go home)    Recommendations for Other Services      Precautions / Restrictions Precautions Precautions: Fall Recall of Precautions/Restrictions: Intact Precaution/Restrictions Comments: fear of falls; not on O2 at baseline Restrictions Weight Bearing Restrictions Per Provider Order: No Other Position/Activity Restrictions: monitor O2       Mobility Bed Mobility Overal bed mobility: Needs Assistance Bed Mobility: Supine to Sit     Supine to sit: Contact guard Sit to supine: Supervision   General bed mobility comments: CGA for safety to get to EOB with minimal use of bed rail.    Transfers Overall transfer level: Needs assistance Equipment used: Rolling walker (2  wheels) Transfers: Sit to/from Stand Sit to Stand: Contact guard assist, Supervision     Step pivot transfers: Contact guard assist     General transfer comment: supervision without assist to CGA at last movement into standing for balance/safety. Heavy cues with poor carry over for reaching back     Balance Overall balance assessment: Needs assistance Sitting-balance support: Feet supported Sitting balance-Leahy Scale: Good     Standing balance support: Bilateral upper extremity supported, During functional activity, Reliant on assistive device for balance Standing balance-Leahy Scale: Poor Standing balance comment: heavy reliance on RW, CGA for safety                           ADL either performed or assessed with clinical judgement   ADL Overall ADL's : Needs assistance/impaired Eating/Feeding: Independent;Sitting   Grooming: Set up;Sitting   Upper Body Bathing: Set up;Sitting       Upper Body Dressing : Set up;Sitting           Toileting- Clothing Manipulation and Hygiene: Maximal assistance;Sit to/from stand Toileting - Clothing Manipulation Details (indicate cue type and reason): pt reporting needs BUE support and can not wipe post bm at this time     Functional mobility during ADLs: Rolling walker (2 wheels);Cueing for sequencing;Cueing for safety      Extremity/Trunk Assessment Upper Extremity Assessment Upper Extremity Assessment: Generalized weakness   Lower Extremity Assessment Lower Extremity Assessment: Defer to PT evaluation        Vision   Vision Assessment?: No apparent visual deficits   Perception     Praxis     Communication Communication Communication: No apparent difficulties   Cognition Arousal: Alert  Behavior During Therapy: WFL for tasks assessed/performed Cognition: No apparent impairments                               Following commands: Intact        Cueing   Cueing Techniques: Verbal cues,  Gestural cues  Exercises      Shoulder Instructions       General Comments HR up to 124 with ambulation    Pertinent Vitals/ Pain       Pain Assessment Pain Assessment: Faces Faces Pain Scale: Hurts a little bit Facial Expression: Relaxed, neutral Body Movements: Absence of movements Muscle Tension: Relaxed Compliance with ventilator (intubated pts.): N/A Vocalization (extubated pts.): N/A CPOT Total: 0 Pain Location: generalized Pain Descriptors / Indicators: Discomfort Pain Intervention(s): Limited activity within patient's tolerance, Monitored during session, Repositioned  Home Living                                          Prior Functioning/Environment              Frequency  Min 2X/week        Progress Toward Goals  OT Goals(current goals can now be found in the care plan section)  Progress towards OT goals: Progressing toward goals  Acute Rehab OT Goals OT Goal Formulation: With patient Time For Goal Achievement: 02/21/24 Potential to Achieve Goals: Good ADL Goals Pt Will Perform Grooming: with modified independence;sitting Pt Will Perform Lower Body Bathing: with modified independence;with adaptive equipment;sit to/from stand Pt Will Perform Lower Body Dressing: with modified independence;with adaptive equipment;sit to/from stand;sitting/lateral leans Pt Will Transfer to Toilet: with modified independence;ambulating Pt Will Perform Toileting - Clothing Manipulation and hygiene: with supervision;sit to/from stand  Plan      Co-evaluation                 AM-PAC OT 6 Clicks Daily Activity     Outcome Measure   Help from another person eating meals?: None Help from another person taking care of personal grooming?: None Help from another person toileting, which includes using toliet, bedpan, or urinal?: A Little Help from another person bathing (including washing, rinsing, drying)?: A Little Help from another person to  put on and taking off regular upper body clothing?: None Help from another person to put on and taking off regular lower body clothing?: A Little 6 Click Score: 21    End of Session Equipment Utilized During Treatment: Gait belt;Rolling walker (2 wheels)  OT Visit Diagnosis: Unsteadiness on feet (R26.81);Muscle weakness (generalized) (M62.81)   Activity Tolerance Patient tolerated treatment well   Patient Left in chair;with call bell/phone within reach (MD in room PT finishing chair set up)   Nurse Communication Mobility status        Time: 8869-8847 OT Time Calculation (min): 22 min  Charges: OT General Charges $OT Visit: 1 Visit OT Treatments $Self Care/Home Management : 8-22 mins  Warrick POUR OTR/L  Acute Rehab Services  [phone] office number   Warrick Berber 02/14/2024, 12:30 PM

## 2024-02-14 NOTE — Progress Notes (Addendum)
 [facility] Progress Note  CC: Abdominal distention, colonic ileus/Ogilvie syndrome  Subjective: He stated having a rough night last night, frequently vomited up clear phlegm.  He continues to have significant abdominal distention. He ate applesauce and drink cranberry juice for breakfast this morning. He passed 2 nonbloody brown watery bowel movements thus far today.  He is also passing gas per the rectum.  He was ambulating in the hall with physical therapy.  He sometimes feels a little short winded, no chest pain.   Objective:   EGD 02/02/2024:\ - Esophageal ulcer with no bleeding and no stigmata of recent bleeding. - LA Grade C reflux esophagitis with no bleeding. - Normal stomach.  - Duodenal erosions without bleeding.  - No specimens collected.  Flexible sigmoidoscopy 02/02/2024: - Blood in the rectum, in the sigmoid colon, in the descending colon and in the transverse colon. No obvious source of bleed  - Diverticulosis in the sigmoid colon and in the descending colon. No active bleeding  - A single superficial ulcer in the rectum. No active bleeding - Will plan for repeat colonsocopy tomorrow if continues to have active GI bleed to exclude right colon lesion, please order stat CTA if has recurrent large volume hematochezia, diverticular hemorrhage in the differential  Vital signs in last 24 hours: Temp:  [97.8 F (36.6 C)-98 F (36.7 C)] 97.8 F (36.6 C) (09/22 0627) Pulse Rate:  [90-123] 123 (09/22 1026) Resp:  [19-20] 20 (09/22 0627) BP: (107-122)/(70-85) 107/78 (09/22 0627) SpO2:  [96 %-99 %] 97 % (09/22 0627) Weight:  [83.1 kg] 83.1 kg (09/22 0354) Last BM Date : 02/14/24 General: Alert fatigued appearing 78 year old male in no acute distress Heart: Irregular rhythm, no murmurs. Pulm: Breath sounds clear with a few crackles in the right lower base.  On room air. Abdomen: Abdomen is grossly distended and tense.  Dull to percussion.  Nontender.   Scattered tan-green bowel sounds to all 4 quadrants.  No bruit.  Protruding umbilical hernia. Extremities: No lower extremity edema.  Compression stockings in use. Neurologic:  Alert and oriented x 4. Grossly normal neurologically. Psych:  Alert and cooperative. Normal mood and affect.  Intake/Output from previous day: 09/21 0701 - 09/22 0700 In: 275.4 [P.O.:240; I.V.:35.4] Out: 550 [Urine:550] Intake/Output this shift: No intake/output data recorded.  Lab Results: Recent Labs    02/12/24 0500 02/13/24 0445 02/14/24 0420  WBC 6.1 5.5 6.2  HGB 11.1* 10.5* 11.3*  HCT 35.6* 33.2* 35.7*  PLT 725* 649* 723*   BMET Recent Labs    02/12/24 0500 02/13/24 0445 02/14/24 0420  NA 137 136 135  K 3.6 4.1 4.2  CL 103 104 103  CO2 20* 22 22  GLUCOSE 135* 141* 144*  BUN 26* 28* 30*  CREATININE 1.21 1.43* 1.45*  CALCIUM  8.6* 8.8* 9.2   LFT Recent Labs    02/14/24 0420  PROT 6.6  ALBUMIN 3.1*  AST 17  ALT 20  ALKPHOS 79  BILITOT 0.4   PT/INR No results for input(s): LABPROT, INR in the last 72 hours. Hepatitis Panel No results for input(s): HEPBSAG, HCVAB, HEPAIGM, HEPBIGM in the last 72 hours.  No results found.  Patient Profile: 78 year old male with complicated recent medical history to include coronary artery disease with stent placement and subsequent in-stent stenosis, acute systolic heart failure with hypervolemia, A-fib with RVR, admitted with clinical presentation suggestive of ischemic colitis/intestinal pneumatosis, with ongoing issues of colonic pseudoobstruction.  He also experienced GI bleed earlier in this  admission prompting cessation of antiplatelet agents.  Assessment / Plan:.  Colonic pseudo-obstruction, subacute. Abdomen remains grossly distended. Tolerating small quantities of a soft diet. Patient endorsed vomiting up clear phlegm numerous times overnight. Passing nonbloody liquid stools daily.  Abdominal x-ray 9/18 showed stable colonic  distention. Hopefully, this will resolve with increased physical activity and improvement in comorbidities. Neostigmine  deferred in setting of CAD with recent MI. Magnesium  level 2.4. K+ 4.2.  Received Oxycodone  on at 4 AM. -- Noncontrast CTAP - Continue to maintain normal electrolytes, avoid narcotics/antihistamines/sedatives - Anticipate colonic pseudoobstruction will eventually resolve with improvement in mobility/acute cardiac issues -- Limit narcotic use.  - Consider Relistor  versus endoscopic decompression  -- Ambulate as tolerated with physical therapy -- Await further recommendations per Dr. Albertus   Diarrhea, hypokalemia. Patient has had issues with diarrhea over the past few months going along with his colonic pseudoobstruction. There is suspicion he may have SIBO, and was previously given Flagyl , which worked well in May, but did not have much of an effect when he was given it this admission.  No significant improvement on Rifaximin . - Rifaximin  550 mg p.o. 3 times daily for 14 days (started 9/19)   GI bleed/acute blood loss anemia. Etiology unclear, but may have been diverticular. He has not had any bleeding since Sept 11 and his hemoglobin has been stable. EGD 9/10 showed an esophageal ulcer without stigmata of recent bleeding, grade C reflux esophagitis, a normal stomach and duodenal erosions. Flexible sigmoidoscopy 9/10 showed blood in the transverse, descending, sigmoid colon and rectum, diverticulosis in the sigmoid and descending colon without active bleeding and a single ulcer in the rectum without bleeding. Hg 10.5 -> 11.3.  Heparin  IV transitioned to Eliquis .  On Brilinta .  ASA on hold. - Continue to monitor patient closely for active GI bleeding - Continue Pantoprazole  40 mg twice daily - Transfuse for hemoglobin level less than 8 - CBC in a.m.  Thrombocytosis, etiology unclear.  PLT 649 -> 723.   AKI. Cr 1.21 -> 1.43 -> 1.45.   Coronary artery disease s/p PCI/ stent  placement 8/25 with subsequent in-stent stenosis 9/9 - On Brilinta     Systolic heart failure/cardiogenic shock, EF 20-25% - On digoxin , torsemide , off milrinone  drip   A-fib with RVR. On Amiodarone , Digoxin  and Eliquis  -- Cardiology planning for cardioversion potentially this week    Principal Problem:   Intestinal ischemia (HCC) Active Problems:   Coronary artery disease involving native coronary artery of native heart without angina pectoris   Essential hypertension   Acute on chronic systolic CHF (congestive heart failure) (HCC)   Malnutrition of moderate degree   Atrial fibrillation with RVR (HCC)   Anemia due to acute blood loss   AKI (acute kidney injury) (HCC)   Type 2 diabetes mellitus with hyperlipidemia (HCC)     LOS: 23 days   Colleen M Kennedy-Smith  02/14/2024, 12:22 PM  Addendum: I have taken a history, reviewed the chart and examined the patient. I performed a substantive portion of this encounter, including complete performance of at least one of the key components, in conjunction with the APP. I agree with the APP's note, impression and recommendations with additional input as follows.  Increasing abdominal distention today, diffusely distended with bowel sounds hypoactive.  Increased tympany on exam.  Mildly tender. Patient reports some flatus and loose liquid like stools Tachycardic  History of previous colonic pseudoobstruction noted back in May of this year.  Ongoing issues with acute on chronic HFrEF New  A-fib with RVR, cardiology following.  Getting amiodarone  On Eliquis  and Brilinta   Agree with stat CT abdomen pelvis; will consider neostigmine  but will need cardiology involvement if this is used.  He has not had narcotics and thus Relistor  will be held for now. May need endoscopic decompression depending on CT and clinical course  Okay to continue with rifaximin  targeting 14 days for possible concomitant SIBO; day 4 of 14  35 minutes total spent  today including patient facing time, coordination of care, reviewing medical history/procedures/pertinent radiology studies, and documentation of the encounter.

## 2024-02-14 NOTE — Progress Notes (Signed)
 Physical Therapy Treatment Patient Details Name: Chase Combs MRN: [id number] DOB: [birth year] Today's Date: 02/14/2024   History of Present Illness 78 year old man admitted 8/30 with progressive nausea vomiting with CT images concerning for ischemic colitis; pneumatosis intestinalis/ileus. 9/9  recurrent STEMI with in-stent stenosis.[name]  PMH: ischemic cardiomyopathy and very recent LAD stent placement for NSTEMI discharged 8/27.    PT Comments  Pt is progressing well towards goals. Pt was CGA for supine to sitting, supervision to CGA for sit to stand with cues for safety, and CGA with +2 chair follow due to poor endurance. Pt required 2 min seated rest break due to dyspnea after 100 ft of gait. Good O2 sats in sitting; difficult to get a good reading during gait due to heavy UE support on RW. Due to pt current functional status, home set up and available assistance at home recommending skilled physical therapy services > 3 hours/day in order to address strength, balance and functional mobility to decrease risk for falls, injury, immobility, skin break down and re-hospitalization.      If plan is discharge home, recommend the following: Assistance with cooking/housework;Assist for transportation;A lot of help with walking and/or transfers     Equipment Recommendations  None recommended by PT       Precautions / Restrictions Precautions Precautions: Fall Recall of Precautions/Restrictions: Intact Precaution/Restrictions Comments: fear of falls; not on O2 at baseline Restrictions Weight Bearing Restrictions Per Provider Order: No     Mobility  Bed Mobility Overal bed mobility: Needs Assistance Bed Mobility: Supine to Sit     Supine to sit: Contact guard     General bed mobility comments: CGA for safety to get to EOB with minimal use of bed rail.    Transfers Overall transfer level: Needs assistance Equipment used: Rolling walker (2 wheels) Transfers: Sit to/from Stand Sit to  Stand: Contact guard assist, Supervision   Step pivot transfers: Contact guard assist       General transfer comment: supervision without assist to CGA at last movement into standing for balance/safety. Heavy cues with poor carry over for reaching back    Ambulation/Gait Ambulation/Gait assistance: +2 safety/equipment, Contact guard assist Gait Distance (Feet): 200 Feet (seated rest break for ~2 min after 100 ft of gait.) Assistive device: Rolling walker (2 wheels) Gait Pattern/deviations: Step-through pattern, Decreased stride length, Trunk flexed Gait velocity: dec Gait velocity interpretation: <1.8 ft/sec, indicate of risk for recurrent falls   General Gait Details: Cues for energy conservation, RW proximity, +2 chair follow for endurance. CGA for forward stepping. Heavy cues for reaching back to sit to prevent falls.     Balance Overall balance assessment: Needs assistance Sitting-balance support: Feet supported Sitting balance-Leahy Scale: Good     Standing balance support: Bilateral upper extremity supported, During functional activity, Reliant on assistive device for balance Standing balance-Leahy Scale: Poor Standing balance comment: heavy reliance on RW, CGA for safety      Communication Communication Communication: No apparent difficulties  Cognition Arousal: Alert Behavior During Therapy: WFL for tasks assessed/performed   PT - Cognitive impairments: No apparent impairments   PT - Cognition Comments: Cooperative but needs encouragement to start Following commands: Intact      Cueing Cueing Techniques: Verbal cues, Gestural cues     General Comments General comments (skin integrity, edema, etc.): Pt assisted by OT with wiping in standing with heavy UE support on RW. HR up to 124 bpm during gait. Difficulty getting O2 sat reading with poor pleth  line during gait.      Pertinent Vitals/Pain Pain Assessment Pain Assessment: 0-10 Pain Score: 4  Pain  Location: generalized Pain Descriptors / Indicators: Discomfort Pain Intervention(s): Monitored during session, Limited activity within patient's tolerance           PT Goals (current goals can now be found in the care plan section) Acute Rehab PT Goals Patient Stated Goal: get well, return home, maybe rehab before home PT Goal Formulation: With patient Time For Goal Achievement: 02/19/24 Potential to Achieve Goals: Good Progress towards PT goals: Progressing toward goals    Frequency    Min 3X/week      PT Plan  Continue with current POC        AM-PAC PT 6 Clicks Mobility   Outcome Measure  Help needed turning from your back to your side while in a flat bed without using bedrails?: A Little Help needed moving from lying on your back to sitting on the side of a flat bed without using bedrails?: A Little Help needed moving to and from a bed to a chair (including a wheelchair)?: A Little Help needed standing up from a chair using your arms (e.g., wheelchair or bedside chair)?: A Little Help needed to walk in hospital room?: A Lot Help needed climbing 3-5 steps with a railing? : Total 6 Click Score: 15    End of Session Equipment Utilized During Treatment: Gait belt Activity Tolerance: Patient tolerated treatment well;Patient limited by fatigue Patient left: with call bell/phone within reach;with chair alarm set;in chair Nurse Communication: Mobility status PT Visit Diagnosis: Unsteadiness on feet (R26.81);Other abnormalities of gait and mobility (R26.89);Muscle weakness (generalized) (M62.81);Difficulty in walking, not elsewhere classified (R26.2)     Time: 8869-8847 PT Time Calculation (min) (ACUTE ONLY): 22 min  Charges:    $Therapeutic Activity: 8-22 mins PT General Charges $$ ACUTE PT VISIT: 1 Visit                     Chase Combs, DPT, CLT  Acute Rehabilitation Services Office: [phone] (Secure chat preferred)    Chase Combs 02/14/2024, 12:09 PM

## 2024-02-14 NOTE — Progress Notes (Addendum)
 Progress Note   Patient: Chase Combs FMW:[id number] DOB: 1945-06-26 DOA: 01/22/2024     23 DOS: the patient was seen and examined on 02/14/2024   Brief hospital course: Mr. Pumphrey was admitted to the hospital with the working diagnosis of ileitis/ ischemic colitis.   78 year old with history of ischemic cardiomyopathy/LAD and heart failure  admitted to the hospital for nausea and vomiting.  Recent hospitalization 08/15 tp 01/19/24 for heart failure, he was diagnosed with severe proximal LAD stenosis which was stented with drug eluding stent. Discharged on dual antiplatelet therapy, goal directed medical therapy for heart failure and 20 mg furosemide  for diuresis.  At home patient had progressive nausea and vomiting, not able to maintain oral medications including antiplatelet therapy.  [name] he was hypotensive with systolic blood pressure 94/51, HR 50, RR 23 and 02 saturation 94% temp 103.5  Lungs with increased work of breathing with no wheezing, heart with S1 and S2 present and tachycardic, abdomen with diffuse tenderness to palpation in the lower quadrants more right than left, no lower extremity edema.   Na 132, K 4.5 Cl 81 bicarbonate 31, glucose 197 bun 64 cr 2,55 AST 29 ALT 31  BNP 220  High sensitive troponin 126 and 108  Lactic acid 3,4  Wbc 2.4 hgb 13.9 plt 489  Urine analysis SG 1,020, protein negative, negative leukocytes and negative hgb  C diff negative   Chest radiograph with hypoinflation, positive cardiomegaly, bilateral basal atelectasis and small bilateral pleural effusions.   EKG 120 bpm, left axis deviation, qtc 501, sinus rhythm with multiple PVC (right bundle branch morphology), with no significant ST segment or T wave changes, (noisy baseline)   CT chest abdomen and pelvis. Bilateral faint ground glass opacities,  Gas along the wall of the ascending colon and cecum, suspicious for pneumoatosis.  Fluid filled colon suggests a  diarrheal state. Diffuse colonic and small bowel distention likely represent ileus.  Increased small volume pelvic and new right sided pericolonic trace fluid, likely secondary to colonic process.  Right nephrolithiasis Suspicion for minimal calcific pancreatitis.   General surgery recommended conservative management.   Concern patient not able to absorb antiplatelet therapy, he was placed on IV cangrelor .  Patient continue to be hypotensive despite IV fluids and was placed on norepinephrine  infusion, consistent with septic shock due to abdominal source/ ischemic colitis.   During hospitalization required PRBC transfusion due to blood loss anemia.   There was concerns of in-stent thrombosis from recent PCI therefore antiplatelets were switched from Cangrelor  to Plavix  and started on milrinone  drip due to cardiogenic shock.   Developed atrial fibrillation with rapid ventricular response, placed on IV amiodarone .  Transition to Aggrastat for anticoagulation.  09/05 GI consulted, clinical picture consistent with colonic ileus, Ogilvie syndrome.  09/08 PRBC transfusion x1, transitioned to clopidogrel .  09/09 chest pain with new antero lateral ST elevations. Underwent urgent cardiac catheterization, LAD with ostially occluded stent with acute stent thrombosis (Culprit vessel). Successful percutaneous coronary intervention ostial proximal LAD, PTCA with balloon dilatation.  Significant volume overloaded with low SV02 and CVP very high, placed on IV furosemide  and milrinone .  09/10 completed antibiotic course.  09/10 EGD with esophageal ulcer with no bleeding and no stigmata of recent bleeding.  LA grade C reflux esophagitis with no bleeding.  Duodenal erosions without bleeding.  Flexible sigmoidoscopy positive blood in the rectum, in the sigmoid colon, in the descending colon and in the transverse colon. No obvious source of bleed.  Diverticulosis in the sigmoid colon and in the descending colon. No  active bleeding.  A single superficial ulcer in the rectum. No active bleeding.  09/12 transition to ticagrelor .  Slowly weaned off TPN, diet as tolerated.   09/19 continue colonic pseudo obstruction, imaging with progression of colonic distention. Started on rifaximin .  Pending cardioversion until bowel function improves.  09/21 pending direct current cardioversion.   Assessment and Plan: * Intestinal ischemia Intestinal pneumatosis/ colonic adynamic ileus  Pseudo obstruction.  Complicated with sepsis, present on admission.  Completed antibiotic therapy on 09/10 TPN has been weaned off.   Trial of rifaximin  550 mg po tid.  Continue supportive care. Tolerating po well.  Repeating CT abdomen and pelvis today.   Acute on chronic systolic CHF (congestive heart failure) (HCC) Echocardiogram with reduced LV systolic function EF 20 to 25%, global hypokinesis, moderate LVH, diastolic dysfunction with EA fusion,  RV systolic function moderately reduced, mild RV enlargement, RVSP 30.7 mmHg, moderate mitral valve regurgitation, mild to moderate TR,   Urine output is 550 ml Systolic blood pressure 100 mmHg range Sv02 42 and 60  Off milrinone   Medical therapy with digoxin  and spironolactone . Holding diuretic therapy   Coronary artery disease involving native coronary artery of native heart without angina pectoris Recent STEMI with stent thrombosis.  Continue with ticagrelor , holding asa due to GI bleeding.  Continue statin.   Essential hypertension Continue blood pressure monitoring Diuresis with torsemide  and spironolactone .   Atrial fibrillation with RVR (HCC) Telemetry with atrial flutter with variable block, with rate 100 bpm.  Rate control with amiodarone , and digoxin , continue anticoagulation with IV heparin  Pending cardioversion when GI status has improved.   AKI (acute kidney injury) (HCC) Hyponatremia, hypokalemia.   Today renal function with serum cr at 1,45 with K at  4,2 and serum bicarbonate at 22 Na 135   Continue close follow up renal function and electrolytes.   Anemia due to acute blood loss GI bleed.   SP 4 units PRBC transfusion.   EGD with flexible sigmoidoscopy with red blood in the colon.  Grade 1 esophagitis.  Continue pantoprazole .  Follow up hgb is 11.3   Malnutrition of moderate degree Continue nutritional supplements.   Type 2 diabetes mellitus with hyperlipidemia (HCC) Continue insulin  sliding scale for glucose cover and monitoring        Subjective: Patient had intermittent nausea with no vomiting, no chest pain and no dyspnea, continue very weak and deconditioned  Physical Exam: Vitals:   02/13/24 2300 02/13/24 2338 02/14/24 0354 02/14/24 0627  BP: 107/82  107/73 107/78  Pulse: (!) 115 (!) 116 (!) 116 (!) 115  Resp: 20 20 19 20   Temp: 97.8 F (36.6 C)  98 F (36.7 C) 97.8 F (36.6 C)  TempSrc: Oral  Oral Oral  SpO2: 98% 99% 99% 97%  Weight:   83.1 kg   Height:       Neurology awake and alert ENT with mild pallor Cardiovascular with S1 and S2 present, irregular with no gallops or rubs, positive systolic murmur at the apex No JVD Respiratory with mild rales at bases with no wheezing or rhonchi  Abdomen with distention, tympanic to percussion, non tender, no guarding or rebound Lower extremity trace, ted hose in place   Data Reviewed:    Family Communication: no family at the bedside   Disposition: Status is: Inpatient Remains inpatient appropriate because: possible cardioversion   Planned Discharge Destination: Home    Author: Elidia Toribio Furnace, MD  02/14/2024 10:08 AM  For on call review [email or web address].

## 2024-02-14 NOTE — Progress Notes (Addendum)
 Advanced Heart Failure Rounding Note  Cardiologist: Alm Clay, MD  HF Consulting Cardiologist: Dr. Zenaida  Chief Complaint: Acute on chronic HFrEF  Subjective:   9/9 PTCA LAD d/t stent thrombosis. A fib w/ RVR. CO-OX 44 %. Started on milrinone  0.25 mcg.  9/10:  EGD and flex sig 9/10.  EGD with 1 superficial esophageal ulcer, LA grade C reflux esophagitis with no bleeding. Flex sig: Red blood in the rectum, sigmoid colon, descending colon and transverse colon. Few diverticula found. No obvious source of bleeding.  9/18 Milrinone  stopped.   CO-OX 42% this am. Recheck pending.  CVP 6-7 with flat waveform. Scr up slightly last couple of days, 1.45 this am.  More tachycardic, now ? AFL 120s. Got IV metoprolol  overnight for tachycardia.  Reports increased phlegm over the last day. Abdomen more distended and gets full quickly. Continues with watery bowel movement.  Walked around the unit this am.   Objective:   Weight Range: 83.1 kg Body mass index is 24.85 kg/m.   Vital Signs:   Temp:  [97.8 F (36.6 C)-98 F (36.7 C)] 98 F (36.7 C) (09/22 1220) Pulse Rate:  [90-123] 93 (09/22 1220) Resp:  [19-21] 21 (09/22 1220) BP: (107-122)/(70-85) 110/72 (09/22 1220) SpO2:  [96 %-99 %] 99 % (09/22 1220) Weight:  [83.1 kg] 83.1 kg (09/22 0354) Last BM Date : 02/14/24  Weight change: Filed Weights   02/12/24 0448 02/13/24 0456 02/14/24 0354  Weight: 80.5 kg 81.5 kg 83.1 kg    Intake/Output:   Intake/Output Summary (Last 24 hours) at 02/14/2024 1259 Last data filed at 02/14/2024 1220 Gross per 24 hour  Intake 240 ml  Output 550 ml  Net -310 ml     Physical Exam   General:  Elderly, chronically ill appearing Cor: JVP 7-8. Regular rate & rhythm, tachy. No murmurs. Lungs: clear Abdomen: Tense, distended. + umbilical hernia Extremities: Trace edema, + TED hose Neuro: alert & orientedx3. Affect flat.   Telemetry  AFL 120s?   Labs    CBC Recent Labs     02/13/24 0445 02/14/24 0420  WBC 5.5 6.2  HGB 10.5* 11.3*  HCT 33.2* 35.7*  MCV 89.2 89.3  PLT 649* 723*   Basic Metabolic Panel Recent Labs    90/78/74 0445 02/14/24 0420  NA 136 135  K 4.1 4.2  CL 104 103  CO2 22 22  GLUCOSE 141* 144*  BUN 28* 30*  CREATININE 1.43* 1.45*  CALCIUM  8.8* 9.2  MG 2.2 2.4   Liver Function Tests Recent Labs    02/13/24 0445 02/14/24 0420  AST 16 17  ALT 20 20  ALKPHOS 70 79  BILITOT 0.7 0.4  PROT 6.2* 6.6  ALBUMIN 2.9* 3.1*    Medications:   Scheduled Medications:  amiodarone   400 mg Oral Daily   apixaban   5 mg Oral BID   Chlorhexidine  Gluconate Cloth  6 each Topical Q0600   digoxin   0.0625 mg Oral Daily   famotidine   20 mg Oral Daily   feeding supplement  237 mL Oral TID BM   Gerhardt's butt cream   Topical BID   insulin  aspart  0-5 Units Subcutaneous QHS   insulin  aspart  0-9 Units Subcutaneous TID WC   melatonin  5 mg Oral QHS   multivitamin with minerals  1 tablet Oral Daily   nystatin    Topical BID   pantoprazole   40 mg Oral BID   rifaximin   550 mg Oral TID   sodium chloride   flush  10-40 mL Intracatheter Q12H   sodium chloride  flush  3 mL Intravenous Q12H   ticagrelor   90 mg Oral BID   traZODone   50 mg Oral QHS    Infusions:    PRN Medications: acetaminophen , ALPRAZolam , guaiFENesin -dextromethorphan , ipratropium-albuterol , ondansetron  (ZOFRAN ) IV, mouth rinse, oxyCODONE , polyethylene glycol, simethicone , sodium chloride  flush, sodium chloride  flush  Patient Profile  Chase Combs is a 78 year old with a history of ICM, chronic biventricular HFrEF, CAD s/p PCI to LAD in 08/25.[name]      Readmitted 01/22/24 with abdominal pain/nausea/vomiting. Hospital course complicated by ischemic colitis w/ septic shock/GI bleed, stent thrombosis involving recent LAD stent and low-output HF.   Assessment/Plan  CAD, recent STEMI d/t stent thrombosis - 01/17/24- PCI/DES LAD  - 02/01/24 Cangrelor  stopped given Plavix  load and ST elevation.  Acute stent thrombosis involving LAD stent treated with PTCA. - Plavix  Genetic Testing collected, pending (sent 9/10, will check with lab (may need to resubmit)) - No chest pain.  - Continue ticagrelor  90mg  BID.  - Off aspirin  with GI bleed.  - Continue high-intensity statin.   New A fib RVR - More tachycardic today. Rate 120s. Rhythm looks different than his afib, more regular. ? Atrial flutter. Check ECG. - Currently on amiodarone  400 mg daily - Given his low output HF, would avoid giving additional IV beta blocker. - Hold off on DCCV until GI issues have resolved - Continue Eliquis  5 BID. Hgb stable. - Continue digoxin  0.0625   A/C HFrEF, ICM   -01/13/24 Echo EF  20-25% RV moderately reduced.   -02/01/24 Echo LVEF 20-25% RV moderately reduced.    - CO-OX 53% with Fick CI 2.2 on 0.125 milrinone . Milrinone  stopped 9/18.  - CO-OX 42%. Repeat pending - CVP 6-7. Continue Torsemide  20 mg daily - Off SGLT2i with fungal rash.  - Continue spiro 25 mg daily - Continue digoxin  0.0625 mcg daily. Dig level < 0.6. - No BP room to further titrate GDMT - Renal function stable.    Anemia in the setting of GI Bleed -Transfused 9/8 with appropriate rise.  - 9/10 he had large bloody BM. Unfortunately given 600 mg Ibuprofen .  - Protonix  and Pepcid  per GI - GI following.  - S/p EGD and flex sig.  EGD with 1 superficial esophageal ulcer, LA grade C reflux esophagitis with no bleeding. Flex sig: Red blood in the rectum, sigmoid colon, descending colon and transverse colon. Few diverticula found. No obvious source of bleeding, possibly diverticular per GI. - Hgb stable.   Intestinal pneumatosis w/ concern for ischemic colitis Ileus Psuedoobstruction - Gen surgery recommended conservative management - Treated with antibiotics for suspected septic shock. - Transverse colon measures 9.3 cm on Xray last week. Abdomen is distended. GI obtaining noncontrast CT A/P  Hypokalemia - Resolved. Continue to  supp as needed    Code status: DNR      Length of Stay: [address], LINDSAY N, PA-C  02/14/2024, 12:59 PM  Advanced Heart Failure Team Pager [phone] (M-F; 7a - 5p)  Please contact [facility] for night-coverage after hours (5p -7a ) and weekends on [email or web address]  Patient seen with PA, I formulated the plan and agree with the above note.   He is in what appears to be atypical atrial flutter today with HR around 120.  BP stable.  CVP 8 on my read.  Repeat co-ox adequate off milrinone  at 60%.  Still with severely distended abdomen.  Taking pos.    General: NAD Neck:  JVP 8 cm, no thyromegaly or thyroid  nodule.  Lungs: Clear to auscultation bilaterally with normal respiratory effort. CV: Nondisplaced PMI.  Heart tachy, regular S1/S2, no S3/S4, no murmur.  Trace ankle edema  Abdomen: Soft, nontender, no hepatosplenomegaly, severe distention.  Skin: Intact without lesions or rashes.  Neurologic: Alert and oriented x 3.  Psych: Normal affect. Extremities: No clubbing or cyanosis.  HEENT: Normal.   Patient appears to be in rapid atypical flutter.  Transition from po amiodarone  to gtt, continue Eliquis .  Will need eventual DCCV if he does not convert spontaneously.   Volume status relatively stable with CVP 8, co-ox 60% off milrinone .  - Continue digoxin  - Add spironolactone  12.5 mg daily.  - Add torsemide  20 mg daily.   S/p stent thrombosis with PTCA.  He is on ticagrelor , no ASA given recent GI bleeding and use of Eliquis .  He is on statin.   Ileus present, going for CT abdomen/pelvis again today.  GI following.  If necessary, we can use neostigmine  with close monitoring.   Chase Combs 02/14/2024 3:36 PM

## 2024-02-14 NOTE — Plan of Care (Signed)
  Problem: Education: Goal: Knowledge of General Education information will improve Description: Including pain rating scale, medication(s)/side effects and non-pharmacologic comfort measures Outcome: Progressing   Problem: Health Behavior/Discharge Planning: Goal: Ability to manage health-related needs will improve Outcome: Progressing   Problem: Activity: Goal: Risk for activity intolerance will decrease Outcome: Progressing   Problem: Nutrition: Goal: Adequate nutrition will be maintained Outcome: Progressing   Problem: Elimination: Goal: Will not experience complications related to bowel motility Outcome: Progressing Goal: Will not experience complications related to urinary retention Outcome: Progressing   Problem: Pain Managment: Goal: General experience of comfort will improve and/or be controlled Outcome: Progressing   Problem: Safety: Goal: Ability to remain free from injury will improve Outcome: Progressing   Problem: Skin Integrity: Goal: Risk for impaired skin integrity will decrease Outcome: Progressing   Problem: Fluid Volume: Goal: Ability to maintain a balanced intake and output will improve Outcome: Progressing   Problem: Health Behavior/Discharge Planning: Goal: Ability to identify and utilize available resources and services will improve Outcome: Progressing Goal: Ability to manage health-related needs will improve Outcome: Progressing   Problem: Metabolic: Goal: Ability to maintain appropriate glucose levels will improve Outcome: Progressing   Problem: Nutritional: Goal: Maintenance of adequate nutrition will improve Outcome: Progressing Goal: Progress toward achieving an optimal weight will improve Outcome: Progressing

## 2024-02-14 NOTE — Progress Notes (Signed)
 Inpatient Rehab Admissions Coordinator:   Continue to follow distantly.  Ongoing medical barriers to CIR.    Reche Lowers, PT, DPT Admissions Coordinator [phone] 02/14/24  10:56 AM

## 2024-02-15 DIAGNOSIS — I10 Essential (primary) hypertension: Secondary | ICD-10-CM | POA: Diagnosis not present

## 2024-02-15 DIAGNOSIS — I5023 Acute on chronic systolic (congestive) heart failure: Secondary | ICD-10-CM | POA: Diagnosis not present

## 2024-02-15 DIAGNOSIS — I2510 Atherosclerotic heart disease of native coronary artery without angina pectoris: Secondary | ICD-10-CM | POA: Diagnosis not present

## 2024-02-15 DIAGNOSIS — K55059 Acute (reversible) ischemia of intestine, part and extent unspecified: Secondary | ICD-10-CM | POA: Diagnosis not present

## 2024-02-15 DIAGNOSIS — I4891 Unspecified atrial fibrillation: Secondary | ICD-10-CM | POA: Diagnosis not present

## 2024-02-15 DIAGNOSIS — K559 Vascular disorder of intestine, unspecified: Secondary | ICD-10-CM | POA: Diagnosis not present

## 2024-02-15 LAB — MAGNESIUM: Magnesium: 2.2 mg/dL (ref 1.7–2.4)

## 2024-02-15 LAB — CBC
HCT: 33.9 % — ABNORMAL LOW (ref 39.0–52.0)
Hemoglobin: 10.7 g/dL — ABNORMAL LOW (ref 13.0–17.0)
MCH: 28.1 pg (ref 26.0–34.0)
MCHC: 31.6 g/dL (ref 30.0–36.0)
MCV: 89 fL (ref 80.0–100.0)
Platelets: 706 K/uL — ABNORMAL HIGH (ref 150–400)
RBC: 3.81 MIL/uL — ABNORMAL LOW (ref 4.22–5.81)
RDW: 20 % — ABNORMAL HIGH (ref 11.5–15.5)
WBC: 3.3 K/uL — ABNORMAL LOW (ref 4.0–10.5)
nRBC: 0 % (ref 0.0–0.2)

## 2024-02-15 LAB — COMPREHENSIVE METABOLIC PANEL WITH GFR
ALT: 17 U/L (ref 0–44)
AST: 12 U/L — ABNORMAL LOW (ref 15–41)
Albumin: 2.8 g/dL — ABNORMAL LOW (ref 3.5–5.0)
Alkaline Phosphatase: 69 U/L (ref 38–126)
Anion gap: 14 (ref 5–15)
BUN: 37 mg/dL — ABNORMAL HIGH (ref 8–23)
CO2: 21 mmol/L — ABNORMAL LOW (ref 22–32)
Calcium: 9 mg/dL (ref 8.9–10.3)
Chloride: 98 mmol/L (ref 98–111)
Creatinine, Ser: 1.41 mg/dL — ABNORMAL HIGH (ref 0.61–1.24)
GFR, Estimated: 52 mL/min — ABNORMAL LOW (ref >60)
Glucose, Bld: 139 mg/dL — ABNORMAL HIGH (ref 70–99)
Potassium: 3.8 mmol/L (ref 3.5–5.1)
Sodium: 133 mmol/L — ABNORMAL LOW (ref 135–145)
Total Bilirubin: 0.6 mg/dL (ref 0.0–1.2)
Total Protein: 6 g/dL — ABNORMAL LOW (ref 6.5–8.1)

## 2024-02-15 LAB — GLUCOSE, CAPILLARY
Glucose-Capillary: 151 mg/dL — ABNORMAL HIGH (ref 70–99)
Glucose-Capillary: 146 mg/dL — ABNORMAL HIGH (ref 70–99)
Glucose-Capillary: 116 mg/dL — ABNORMAL HIGH (ref 70–99)
Glucose-Capillary: 131 mg/dL — ABNORMAL HIGH (ref 70–99)

## 2024-02-15 LAB — COOXEMETRY PANEL
Carboxyhemoglobin: 2.1 % — ABNORMAL HIGH (ref 0.5–1.5)
Methemoglobin: 1.1 % (ref 0.0–1.5)
O2 Saturation: 57.4 %
Total hemoglobin: 11.6 g/dL — ABNORMAL LOW (ref 12.0–16.0)

## 2024-02-15 MED ORDER — NEOSTIGMINE METHYLSULFATE 10 MG/10ML IV SOLN
0.2500 mg | Freq: Four times a day (QID) | INTRAVENOUS | Status: DC
  Administered 2024-02-15 – 2024-02-17 (×9): 0.25 mg via SUBCUTANEOUS
  Filled 2024-02-15 (×15): qty 0.25

## 2024-02-15 MED ORDER — SODIUM CHLORIDE 0.9 % IV SOLN: 1.5000 mg | Freq: Once | INTRAVENOUS | Status: DC

## 2024-02-15 MED ORDER — FUROSEMIDE 10 MG/ML IJ SOLN
80.0000 mg | Freq: Once | INTRAMUSCULAR | Status: AC
  Administered 2024-02-15: 80 mg via INTRAVENOUS
  Filled 2024-02-15: qty 8

## 2024-02-15 MED ORDER — DIPHENHYDRAMINE HCL 25 MG PO CAPS
25.0000 mg | ORAL_CAPSULE | Freq: Every evening | ORAL | Status: DC | PRN
  Administered 2024-02-15 – 2024-02-16 (×2): 25 mg via ORAL
  Filled 2024-02-15 (×2): qty 1

## 2024-02-15 MED ORDER — ATROPINE SULFATE 1 MG/ML IV SOLN: 0.5000 mg | Freq: Once | INTRAVENOUS | Status: DC | PRN

## 2024-02-15 MED ORDER — POTASSIUM CHLORIDE 20 MEQ PO PACK
40.0000 meq | PACK | Freq: Once | ORAL | Status: AC
Start: 2024-02-15 — End: 2024-02-15
  Administered 2024-02-15: 40 meq via ORAL
  Filled 2024-02-15: qty 2

## 2024-02-15 MED ORDER — POTASSIUM CHLORIDE 20 MEQ PO PACK
40.0000 meq | PACK | Freq: Once | ORAL | Status: AC
  Administered 2024-02-15: 40 meq via ORAL
  Filled 2024-02-15: qty 2

## 2024-02-15 MED ORDER — POTASSIUM CHLORIDE CRYS ER 20 MEQ PO TBCR: 40.0000 meq | EXTENDED_RELEASE_TABLET | Freq: Once | ORAL | Status: DC

## 2024-02-15 MED ORDER — GLYCOPYRROLATE 0.2 MG/ML IJ SOLN
0.1000 mg | Freq: Once | INTRAMUSCULAR | Status: AC
  Administered 2024-02-15: 0.1 mg via INTRAVENOUS
  Filled 2024-02-15: qty 0.5

## 2024-02-15 MED ORDER — ATROPINE SULFATE 1 MG/10ML IJ SOSY
0.5000 mg | PREFILLED_SYRINGE | INTRAMUSCULAR | Status: DC | PRN
  Filled 2024-02-15 (×2): qty 10

## 2024-02-15 MED ORDER — POTASSIUM CHLORIDE CRYS ER 20 MEQ PO TBCR
40.0000 meq | EXTENDED_RELEASE_TABLET | Freq: Once | ORAL | Status: DC
  Filled 2024-02-15: qty 2

## 2024-02-15 NOTE — Progress Notes (Signed)
 Inpatient Rehab Admissions Coordinator:    CIR following at as distance. Pt. Is not medically stable for admission at this time.  Leita Kleine, MS, CCC-SLP Rehab Admissions Coordinator  [phone] (celll) [phone] (office)

## 2024-02-15 NOTE — Progress Notes (Signed)
 Progress Note   Patient: Chase Combs FMW:[id number] DOB: Nov 22, 1945 DOA: 01/22/2024     24 DOS: the patient was seen and examined on 02/15/2024   Brief hospital course: Chase Combs was admitted to the hospital with the working diagnosis of ileitis/ ischemic colitis.   78 year old with history of ischemic cardiomyopathy/LAD and heart failure  admitted to the hospital for nausea and vomiting.  Recent hospitalization 08/15 tp 01/19/24 for heart failure, he was diagnosed with severe proximal LAD stenosis which was stented with drug eluding stent. Discharged on dual antiplatelet therapy, goal directed medical therapy for heart failure and 20 mg furosemide  for diuresis.  At home patient had progressive nausea and vomiting, not able to maintain oral medications including antiplatelet therapy.  [name] he was hypotensive with systolic blood pressure 94/51, HR 50, RR 23 and 02 saturation 94% temp 103.5  Lungs with increased work of breathing with no wheezing, heart with S1 and S2 present and tachycardic, abdomen with diffuse tenderness to palpation in the lower quadrants more right than left, no lower extremity edema.   Na 132, K 4.5 Cl 81 bicarbonate 31, glucose 197 bun 64 cr 2,55 AST 29 ALT 31  BNP 220  High sensitive troponin 126 and 108  Lactic acid 3,4  Wbc 2.4 hgb 13.9 plt 489  Urine analysis SG 1,020, protein negative, negative leukocytes and negative hgb  C diff negative   Chest radiograph with hypoinflation, positive cardiomegaly, bilateral basal atelectasis and small bilateral pleural effusions.   EKG 120 bpm, left axis deviation, qtc 501, sinus rhythm with multiple PVC (right bundle branch morphology), with no significant ST segment or T wave changes, (noisy baseline)   CT chest abdomen and pelvis. Bilateral faint ground glass opacities,  Gas along the wall of the ascending colon and cecum, suspicious for pneumoatosis.  Fluid filled colon suggests a  diarrheal state. Diffuse colonic and small bowel distention likely represent ileus.  Increased small volume pelvic and new right sided pericolonic trace fluid, likely secondary to colonic process.  Right nephrolithiasis Suspicion for minimal calcific pancreatitis.   General surgery recommended conservative management.   Concern patient not able to absorb antiplatelet therapy, he was placed on IV cangrelor .  Patient continue to be hypotensive despite IV fluids and was placed on norepinephrine  infusion, consistent with septic shock due to abdominal source/ ischemic colitis.   During hospitalization required PRBC transfusion due to blood loss anemia.   There was concerns of in-stent thrombosis from recent PCI therefore antiplatelets were switched from Cangrelor  to Plavix  and started on milrinone  drip due to cardiogenic shock.   Developed atrial fibrillation with rapid ventricular response, placed on IV amiodarone .  Transition to Aggrastat for anticoagulation.  09/05 GI consulted, clinical picture consistent with colonic ileus, Ogilvie syndrome.  09/08 PRBC transfusion x1, transitioned to clopidogrel .  09/09 chest pain with new antero lateral ST elevations. Underwent urgent cardiac catheterization, LAD with ostially occluded stent with acute stent thrombosis (Culprit vessel). Successful percutaneous coronary intervention ostial proximal LAD, PTCA with balloon dilatation.  Significant volume overloaded with low SV02 and CVP very high, placed on IV furosemide  and milrinone .  09/10 completed antibiotic course.  09/10 EGD with esophageal ulcer with no bleeding and no stigmata of recent bleeding.  LA grade C reflux esophagitis with no bleeding.  Duodenal erosions without bleeding.  Flexible sigmoidoscopy positive blood in the rectum, in the sigmoid colon, in the descending colon and in the transverse colon. No obvious source of bleed.  Diverticulosis in the sigmoid colon and in the descending colon. No  active bleeding.  A single superficial ulcer in the rectum. No active bleeding.  09/12 transition to ticagrelor .  Slowly weaned off TPN, diet as tolerated.   09/19 continue colonic pseudo obstruction, imaging with progression of colonic distention. Started on rifaximin .  Pending cardioversion until bowel function improves.  09/21 pending direct current cardioversion.  09/22 CT abdomen and pelvis dilated loops of small bowel and colon with air fluid levels in the distal colon and rectum. No obstruction. Appearance consistent with ileus or functional bowel disturbance.  09/23 trial of neostigmine     Assessment and Plan: * Intestinal ischemia Intestinal pneumatosis/ colonic adynamic ileus  Pseudo obstruction.  Complicated with sepsis, present on admission.  Completed antibiotic therapy on 09/10 TPN has been weaned off.   Trial of rifaximin  550 mg po tid.  Continue supportive care. Tolerating po well.  Follow up imaging with persistent small bowel and colonic dilatation. Plan for trial on neostigmine  today with close telemetry monitoring.   Acute on chronic systolic CHF (congestive heart failure) (HCC) Echocardiogram with reduced LV systolic function EF 20 to 25%, global hypokinesis, moderate LVH, diastolic dysfunction with EA fusion,  RV systolic function moderately reduced, mild RV enlargement, RVSP 30.7 mmHg, moderate mitral valve regurgitation, mild to moderate TR,   Urine output not documented.  Systolic blood pressure 100 mmHg range Sv02 57.4   Off milrinone   Medical therapy with digoxin  and spironolactone . Resume torsemide  for diuresis, low dose 20 mg.   Coronary artery disease involving native coronary artery of native heart without angina pectoris Recent STEMI with stent thrombosis.  Continue with ticagrelor , holding asa due to GI bleeding.  Continue statin.   Essential hypertension Continue blood pressure monitoring Diuresis with torsemide  and spironolactone .    Atrial fibrillation with RVR (HCC) Telemetry with atrial flutter with variable block, with rate 100 bpm.  Rate control with amiodarone , and digoxin , continue anticoagulation with IV heparin  Pending cardioversion when GI status has improved.   AKI (acute kidney injury) Hyponatremia, hypokalemia.   Stable renal function with serum cr at 1,41 with K at 3,8 and serum bicarbonate at 21  Na 133 and Mg 2.2   Add 40 meq Kcl today, to keep K at 4.  Continue close follow up renal function and electrolytes.   Anemia due to acute blood loss GI bleed.   SP 4 units PRBC transfusion.   EGD with flexible sigmoidoscopy with red blood in the colon.  Grade 1 esophagitis.  Continue pantoprazole .  Follow up hgb is 11.3   Malnutrition of moderate degree Continue nutritional supplements.   Type 2 diabetes mellitus with hyperlipidemia (HCC) Glucose has been stable, will hold on insulin  therapy and will check capillary glucose as needed.  Patient not on statin therapy      Subjective: Patient continue to have abdominal distention, not back to his baseline, intermittent nausea with no vomiting, very weak and deconditioned   Physical Exam: Vitals:   02/15/24 0543 02/15/24 0643 02/15/24 0859 02/15/24 1100  BP: 115/78 95/71  106/68  Pulse: (!) 119 84 (!) 103 76  Resp: 20 20  15   Temp:  97.6 F (36.4 C)  97.7 F (36.5 C)  TempSrc:  Oral  Oral  SpO2: 95% 97%  93%  Weight:      Height:       Neurology awake and alert ENT with mild pallor Cardiovascular with S1 and S2 present, irregular with no gallops or rubs,  positive systolic murmur at the left lower sternal border Respiratory with no rales or wheezing, no rhonchi  Abdomen distended and tympanic with no tenderness or guarding  Trace lower extremity edema, with ted hose in place  Data Reviewed:    Family Communication: no family at the bedside   Disposition: Status is: Inpatient Remains inpatient appropriate because: persistent  abdominal distention,   Planned Discharge Destination: Skilled nursing facility     Author: Elidia Toribio Furnace, MD 02/15/2024 1:03 PM  For on call review [email or web address].

## 2024-02-15 NOTE — Progress Notes (Signed)
 PT Cancellation Note  Patient Details Name: Chase Combs MRN: [id number] DOB: 11/13/1945   Cancelled Treatment:    Reason Eval/Treat Not Completed: Patient at procedure or test/unavailable  Spoke with RN and advised to hold PT visit at this time due to just receiving medications to help his bowels move.  Will plan to follow-up tomorrow.   Leontine Roads, PT, DPT [facility]  Rehabilitation Services Physical Therapist Office: [phone] Website: [email or web address]  Leontine GORMAN Roads 02/15/2024, 1:45 PM

## 2024-02-15 NOTE — Plan of Care (Signed)
   Medical records reviewed including progress, labs and imaging. Patient is unfortunately dealing with persistent small bowel and colonic dilatation per imaging, abdominal distension, N/V, shortness of breath, watery Bms, and afib. He remains high risk for decline.    Plan is for neostigmine  to hopefully clear out stool. Await recommendations for sigmoidoscopy for decompression if needed.   Goals of care are clear for full scope treatment. Patient and family have been encouraged to reach out for needs if they arise.   PMT will continue to follow peripherally. Thank you for your referral and allowing PMT to assist in Mr. Jru Pense Cleland's care.   Karole Oo, PA-C Palliative Medicine Team  Team Phone # [phone]   NO CHARGE

## 2024-02-15 NOTE — TOC Progression Note (Signed)
 Transition of Care [facility]) - Progression Note    Patient Details  Name: Chase Combs MRN: [id number] Date of Birth: 1945-06-11  Transition of [facility]) CM/SW Contact  Justina Delcia Czar, RN Phone Number: [phone] 02/15/2024, 12:27 PM  Clinical Narrative:    CIR following for IP rehab. Pt states he had [facility] in the past for [facility].   Chart reviewed for discharge readiness, patient not medically stable for d/c. Inpatient CM/CSW will continue to monitor pt's advancement through interdisciplinary progression rounds. If new pt transition needs arise, MD please place a TOC consult.     Expected Discharge Plan: Home w Home Health Services Barriers to Discharge: Continued Medical Work up    Expected Discharge Plan and Services   Discharge Planning Services: CM Consult   Living arrangements for the past 2 months: Single Family Home                      Social Drivers of Health (SDOH) Interventions SDOH Screenings   Food Insecurity: No Food Insecurity (02/06/2024)  Housing: Low Risk  (02/06/2024)  Transportation Needs: No Transportation Needs (02/06/2024)  Utilities: Not At Risk (02/06/2024)  Social Connections: Moderately Integrated (02/06/2024)  Tobacco Use: Low Risk  (02/02/2024)    Readmission Risk Interventions    01/26/2024   12:46 PM 10/21/2023   11:36 AM  Readmission Risk Prevention Plan  Transportation Screening Complete Complete  PCP or Specialist Appt within 5-7 Days  Complete  Home Care Screening  Complete  Medication Review (RN CM)  Complete  Medication Review (RN Care Manager) Referral to Pharmacy   PCP or Specialist appointment within 3-5 days of discharge Complete   HRI or Home Care Consult Complete   SW Recovery Care/Counseling Consult Complete   Palliative Care Screening Not Applicable   Skilled Nursing Facility Not Applicable

## 2024-02-15 NOTE — Plan of Care (Signed)
  Problem: Activity: Goal: Risk for activity intolerance will decrease Outcome: Progressing   Problem: Nutrition: Goal: Adequate nutrition will be maintained Outcome: Progressing   Problem: Coping: Goal: Level of anxiety will decrease Outcome: Progressing   Problem: Pain Managment: Goal: General experience of comfort will improve and/or be controlled Outcome: Progressing   Problem: Safety: Goal: Ability to remain free from injury will improve Outcome: Progressing

## 2024-02-15 NOTE — Progress Notes (Addendum)
 [facility] Progress Note  CC:  Abdominal distention, colonic ileus/Ogilvie syndrome   Subjective: He continues to have significant abdominal distention with generalized abdominal pain.  He has nausea.  No vomiting this morning.  He has passed 2 watery brown nonbloody stools earlier this morning.  He feels slightly short of breath without chest pain at this time.  RN at the bedside.   Objective:   EGD 02/02/2024: - Esophageal ulcer with no bleeding and no stigmata of recent bleeding. - LA Grade C reflux esophagitis with no bleeding. - Normal stomach.  - Duodenal erosions without bleeding.  - No specimens collected.   Flexible sigmoidoscopy 02/02/2024: - Blood in the rectum, in the sigmoid colon, in the descending colon and in the transverse colon. No obvious source of bleed  - Diverticulosis in the sigmoid colon and in the descending colon. No active bleeding  - A single superficial ulcer in the rectum. No active bleeding  Vital signs in last 24 hours: Temp:  [97.6 F (36.4 C)-98 F (36.7 C)] 97.6 F (36.4 C) (09/23 0643) Pulse Rate:  [84-123] 103 (09/23 0859) Resp:  [20-21] 20 (09/23 0643) BP: (95-115)/(71-82) 95/71 (09/23 0643) SpO2:  [94 %-99 %] 97 % (09/23 0643) Last BM Date : 02/14/24 General: Alert anxious appearing 78 year old male in no acute distress. Heart: Tachycardic, no murmurs. Pulm: Breath sounds clear, few crackles in the bases.  On room air. Abdomen: Abdomen is grossly distended and tense.  Generalized tenderness without rebound or guarding.  Few tinkling bowel sounds to all 4 quadrants.  Umbilical hernia protrudes. Extremities: No lower extremity edema.  Compression stockings in use. Neurologic:  Alert and oriented x 4. Speech is clear. Moves all extremities equally. Psych:  Alert and cooperative. Normal mood and affect.  Intake/Output from previous day: 09/22 0701 - 09/23 0700 In: 388.4 [P.O.:120; I.V.:268.4] Out: -  Intake/Output this  shift: Total I/O In: 46.1 [I.V.:46.1] Out: -   Lab Results: Recent Labs    02/13/24 0445 02/14/24 0420 02/15/24 0505  WBC 5.5 6.2 3.3*  HGB 10.5* 11.3* 10.7*  HCT 33.2* 35.7* 33.9*  PLT 649* 723* 706*   BMET Recent Labs    02/13/24 0445 02/14/24 0420 02/15/24 0505  NA 136 135 133*  K 4.1 4.2 3.8  CL 104 103 98  CO2 22 22 21*  GLUCOSE 141* 144* 139*  BUN 28* 30* 37*  CREATININE 1.43* 1.45* 1.41*  CALCIUM  8.8* 9.2 9.0   LFT Recent Labs    02/15/24 0505  PROT 6.0*  ALBUMIN 2.8*  AST 12*  ALT 17  ALKPHOS 69  BILITOT 0.6     CT ABDOMEN PELVIS WO CONTRAST Result Date: 02/14/2024 CLINICAL DATA:  Abdominal distension, colonic ileus EXAM: CT ABDOMEN AND PELVIS WITHOUT CONTRAST TECHNIQUE: Multidetector CT imaging of the abdomen and pelvis was performed following the standard protocol without IV contrast. RADIATION DOSE REDUCTION: This exam was performed according to the departmental dose-optimization program which includes automated exposure control, adjustment of the mA and/or kV according to patient size and/or use of iterative reconstruction technique. COMPARISON:  Radiographs 02/10/2024 and CT scan 01/30/2024 FINDINGS: Lower chest: Right middle lobe and right lower lobe atelectasis along the right hemidiaphragm. Mild lingular and left lower lobe atelectasis along the left hemidiaphragm. Prominent cardiomegaly. Coronary and aortic atherosclerosis. Possible central venous catheter tip the cavoatrial junction. Hepatobiliary: High density in the gallbladder probably from sludge. Otherwise unremarkable. Pancreas: Unremarkable Spleen: Unremarkable Adrenals/Urinary Tract: Stable renal cysts. 0.3 cm right  kidney lower pole calcification suspicious for nonobstructive stone. No hydronephrosis or hydroureter. Right adrenal nodularity without overt mass, stable. Stomach/Bowel: Moderately distended stomach with air-fluid level. Scattered dilated loops of small bowel, index lesion in the pelvis  4.1 cm diameter. Dilated colon with air-fluid levels. Air-fluid levels noted in the distal colon and rectum indicating diarrheal process. Sigmoid colon diverticulosis without active diverticulitis. No lead point for obstruction is identified. No obvious twisted bowel or volvulus. No pneumatosis observed. Vascular/Lymphatic: Atherosclerosis is present, including aortoiliac atherosclerotic disease. Atheromatous plaque noted proximally in the superior mesenteric artery. Vascular patency not assessed on today's noncontrast CT examination although the SMA was patent on a CT angiogram of 01/30/2024. Narrowing of the proximal celiac trunk possibly related to median arcuate ligament. Reproductive: Fiducials along the mildly enlarged prostate gland. Prominent median lobe indents the bladder base. Other: No supplemental non-categorized findings. Musculoskeletal: Grade 1 degenerative anterolisthesis L4-5. Multilevel lumbar degenerative facet arthropathy with foraminal impingement bilaterally at L3-4 and on the right at L1-2, L2-3, and L4-5 as well. Umbilical hernia contains adipose tissue. 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 IMPRESSION: 1. Dilated loops of small bowel and colon with air-fluid levels in the distal colon and rectum indicating diarrheal process. No lead point for obstruction is identified. No pneumatosis or free air. Presumably the appearance is due to ileus or functional bowel disturbance. 2. 0.3 cm right kidney lower pole nonobstructive stone. 3. Multilevel lumbar degenerative facet arthropathy with multilevel foraminal impingement. 4. Umbilical hernia contains adipose tissue. 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 5. High density in the gallbladder probably from sludge. 6. Mild bibasilar atelectasis. 7. Aortic Atherosclerosis (ICD10-I70.0). Coronary atherosclerosis. Prominent cardiomegaly. Electronically Signed   By: Ryan Salvage M.D.   On: 02/14/2024 16:42    Patient Profile: 78 year old male with complicated recent medical history to include coronary artery disease with stent placement and subsequent in-stent stenosis, acute systolic heart failure with hypervolemia, A-fib with RVR, admitted with clinical presentation suggestive of ischemic colitis/intestinal pneumatosis, with ongoing issues of colonic pseudoobstruction.  He also experienced GI bleed earlier in this admission prompting cessation of antiplatelet agents.   Assessment / Plan:  Colonic pseudo-obstruction, subacute. Abdomen remains grossly distended. Tolerating small quantities of a soft diet. Patient endorsed vomiting up clear phlegm numerous times overnight. Passing nonbloody liquid stools daily.  CTAP without contrast 9/22 showed dilated loops of small bowel and colon with air-fluid levels in the distal colon and rectum without obstruction or pneumatosis. -Robinul  0.1 mg IV to be administered 5 to 10 minutes prior to Neostigmine  to prevent bradycardia -Neostigmine  at 1.5 mg slow IV push over 5 minutes with continuous cardiac monitoring with RN at the bedside x 30 minutes. Have Atropine  at the bedside. Place patient supine on the bedpan prior to administering neostigmine  which typically results in a rapid stool output. -Defer recommendations regarding sigmoidoscopy for decompression to Dr. Albertus, await response from neostigmine  -Ambulate as tolerated with physical therapy   Diarrhea, hypokalemia. Patient has had issues with diarrhea over the past few months going along with his colonic pseudoobstruction. There is suspicion he may have SIBO, and was previously given Flagyl , which worked well in May, but did not have much of an effect when he was given it this admission.  No significant improvement on Rifaximin . -Rifaximin  550 mg p.o. 3 times daily for 14 days (started 9/19)   GI bleed/acute blood loss anemia. Etiology unclear, but may have been diverticular. He has not had any bleeding  since Sept 11 and  his hemoglobin has been stable. EGD 9/10 showed an esophageal ulcer without stigmata of recent bleeding, grade C reflux esophagitis, a normal stomach and duodenal erosions. Flexible sigmoidoscopy 9/10 showed blood in the transverse, descending, sigmoid colon and rectum, diverticulosis in the sigmoid and descending colon without active bleeding and a single ulcer in the rectum without bleeding. Hg 10.5 -> 11.3 -> today Hg 10.7. Heparin  IV transitioned to Eliquis .  On Brilinta .  ASA on hold. -Continue to monitor patient closely for active GI bleeding -Continue Pantoprazole  40 mg twice daily -Transfuse for hemoglobin level less than 8 -CBC in a.m.   Thrombocytosis, suspect acute reactant response. PLT 649 -> 723 -> 706.    AKI. Cr 1.21 -> 1.43 -> 1.45.   Coronary artery disease s/p PCI/ stent placement 8/25 with subsequent in-stent stenosis 9/9 -On Brilinta     Systolic heart failure/cardiogenic shock, EF 20-25% -On Digoxin , Torsemide , off Milrinone  drip   A-fib with RVR. On Amiodarone , Digoxin  and Eliquis  -Cardiology planning for cardioversion potentially this week      Principal Problem:   Intestinal ischemia Active Problems:   Coronary artery disease involving native coronary artery of native heart without angina pectoris   Essential hypertension   Acute on chronic systolic CHF (congestive heart failure) (HCC)   Malnutrition of moderate degree   Atrial fibrillation with RVR (HCC)   Anemia due to acute blood loss   AKI (acute kidney injury)   Type 2 diabetes mellitus with hyperlipidemia (HCC)     LOS: 24 days   Elida HERO Kennedy-Smith  02/15/2024, 10:11 AM  Addendum: I have taken a history, reviewed the chart and examined the patient. I performed a substantive portion of this encounter, including complete performance of at least one of the key components, in conjunction with the APP. I agree with the APP's note, impression and recommendations with additional  input as follows.  CT from yesterday reviewed which shows fairly diffuse ileus from small bowel to colon.  Abdomen still distended and tympanitic  Agree with neostigmine  initially ordered IV but pharmacy suggested trying subcutaneous administration first at 0.25 mg subcu every 6 hours.  Monitor on telemetry and for symptomatic bradycardia.  If subcutaneous neostigmine  ineffective we may consider IV at 1.5 mg to 2 mg thereafter.  Continue rifaximin  3 times daily for SIBO, started 02/11/2024 with plans for 14 days of therapy  55 minutes total spent today including patient facing time, coordination of care, reviewing medical history/procedures/pertinent radiology studies, and documentation of the encounter.

## 2024-02-15 NOTE — Progress Notes (Signed)
 Physical Therapy Treatment Patient Details Name: Chase Combs MRN: [id number] DOB: 12/05/1945 Today's Date: 02/15/2024   History of Present Illness 78 year old man admitted 8/30 with progressive nausea vomiting with CT images concerning for ischemic colitis; pneumatosis intestinalis/ileus. 9/9  recurrent STEMI with in-stent stenosis.[name]  PMH: ischemic cardiomyopathy and very recent LAD stent placement for NSTEMI discharged 8/27.    PT Comments  Limited by abdominal discomfort and dizziness with activity today. Pt tolerated short distance ambulation at CGA level assist with reliance on RW for support. Transfers with CGA, and Mod I with bed mobility today. Easily fatigued. SpO2 98% on RA while ambulating. BP noted to be 98/69 after sitting down following ambulatory bout. (126/80 prior to activity supine in bed.) Educated on safety and awareness. Suggest formal orthostatics be assessed next PT visit. Patient will continue to benefit from skilled physical therapy services to further improve independence with functional mobility.     If plan is discharge home, recommend the following: Assistance with cooking/housework;Assist for transportation;A little help with walking and/or transfers;A little help with bathing/dressing/bathroom   Can travel by private vehicle        Equipment Recommendations  None recommended by PT    Recommendations for Other Services       Precautions / Restrictions Precautions Precautions: Fall Recall of Precautions/Restrictions: Intact Precaution/Restrictions Comments: fear of falls; not on O2 at baseline Restrictions Weight Bearing Restrictions Per Provider Order: No Other Position/Activity Restrictions: monitor O2     Mobility  Bed Mobility Overal bed mobility: Modified Independent             General bed mobility comments: No assist needed with bed mobility. Extra time.    Transfers Overall transfer level: Needs assistance Equipment used: Rolling  walker (2 wheels) Transfers: Sit to/from Stand Sit to Stand: Contact guard assist           General transfer comment: CGA for safety. Cues for hand placement. Slow effortful rise but stable with RW once upright. Good control descending back onto EOB.    Ambulation/Gait Ambulation/Gait assistance: Contact guard assist Gait Distance (Feet): 50 Feet Assistive device: Rolling walker (2 wheels) Gait Pattern/deviations: Step-through pattern, Decreased stride length, Trunk flexed Gait velocity: dec Gait velocity interpretation: <1.31 ft/sec, indicative of household ambulator   General Gait Details: Grossly stable with light RW support. Pt a bit anxious but no buckling noted. Good RW control. Fatigues easily. Complains of dizziness - see BP below after pt sat down, suspect orthostatic. Declines to ambulate further or attempt second round after seated rest.   Stairs             Wheelchair Mobility     Tilt Bed    Modified Rankin (Stroke Patients Only)       Balance Overall balance assessment: Needs assistance Sitting-balance support: Feet supported Sitting balance-Leahy Scale: Good     Standing balance support: Bilateral upper extremity supported, During functional activity, Reliant on assistive device for balance Standing balance-Leahy Scale: Poor Standing balance comment: Reliant on RW                            Communication Communication Communication: No apparent difficulties Factors Affecting Communication: Hearing impaired  Cognition Arousal: Alert Behavior During Therapy: WFL for tasks assessed/performed   PT - Cognitive impairments: No apparent impairments  Following commands: Intact      Cueing Cueing Techniques: Verbal cues, Gestural cues  Exercises      General Comments General comments (skin integrity, edema, etc.): Pre-activity in bed: BP 126/80. After ambulating + dizziness seated EOB 98/69. SpO2  98% on RA with ambulation. HR 102.      Pertinent Vitals/Pain Pain Assessment Pain Assessment: Faces Faces Pain Scale: Hurts little more Pain Location: stomach Pain Descriptors / Indicators: Discomfort Pain Intervention(s): Monitored during session, Limited activity within patient's tolerance, Repositioned    Home Living                          Prior Function            PT Goals (current goals can now be found in the care plan section) Acute Rehab PT Goals Patient Stated Goal: get well, return home, maybe rehab before home PT Goal Formulation: With patient Time For Goal Achievement: 02/19/24 Potential to Achieve Goals: Good Progress towards PT goals: Progressing toward goals    Frequency    Min 3X/week      PT Plan      Co-evaluation              AM-PAC PT 6 Clicks Mobility   Outcome Measure  Help needed turning from your back to your side while in a flat bed without using bedrails?: A Little Help needed moving from lying on your back to sitting on the side of a flat bed without using bedrails?: A Little Help needed moving to and from a bed to a chair (including a wheelchair)?: A Little Help needed standing up from a chair using your arms (e.g., wheelchair or bedside chair)?: A Little Help needed to walk in hospital room?: A Little Help needed climbing 3-5 steps with a railing? : Total 6 Click Score: 16    End of Session Equipment Utilized During Treatment: Gait belt Activity Tolerance: Patient tolerated treatment well;Patient limited by fatigue Patient left: with call bell/phone within reach;in bed;with bed alarm set;with family/visitor present (declines sitting in recliner at this time)   PT Visit Diagnosis: Unsteadiness on feet (R26.81);Other abnormalities of gait and mobility (R26.89);Muscle weakness (generalized) (M62.81);Difficulty in walking, not elsewhere classified (R26.2)     Time: 8479-8464 PT Time Calculation (min) (ACUTE  ONLY): 15 min  Charges:    $Therapeutic Activity: 8-22 mins PT General Charges $$ ACUTE PT VISIT: 1 Visit                     Leontine Roads, PT, DPT [facility]  Rehabilitation Services Physical Therapist Office: [phone] Website: [email or web address]    Leontine GORMAN Roads 02/15/2024, 4:44 PM

## 2024-02-15 NOTE — Progress Notes (Addendum)
 Advanced Heart Failure Rounding Note  Cardiologist: Alm Clay, MD  HF Consulting Cardiologist: Dr. Zenaida  Chief Complaint: Acute on chronic HFrEF  Subjective:   9/9 PTCA LAD d/t stent thrombosis. A fib w/ RVR. CO-OX 44 %. Started on milrinone  0.25 mcg.  9/10:  EGD and flex sig 9/10.  EGD with 1 superficial esophageal ulcer, LA grade C reflux esophagitis with no bleeding. Flex sig: Red blood in the rectum, sigmoid colon, descending colon and transverse colon. Few diverticula found. No obvious source of bleeding.  9/18 Milrinone  stopped.   CT A/P w/o on 09/22 w/ dilated loops of small bowel and colon w/ air fluid levels in distal colon and rectum w/ obstruction or pneumatosis.  GI has ordered neostigmine .  CO-OX 57% this am.  CVP 10. Is/Os not recorded. Remains on po Torsemide .   More shortness of breath today. Abdomen remains distended and notes ongoing watery bowel movements.  Objective:   Weight Range: 83.1 kg Body mass index is 24.85 kg/m.   Vital Signs:   Temp:  [97.6 F (36.4 C)-98 F (36.7 C)] 97.7 F (36.5 C) (09/23 1100) Pulse Rate:  [76-121] 76 (09/23 1100) Resp:  [15-21] 15 (09/23 1100) BP: (95-115)/(68-82) 106/68 (09/23 1100) SpO2:  [93 %-98 %] 93 % (09/23 1100) Last BM Date : 02/15/24  Weight change: Filed Weights   02/12/24 0448 02/13/24 0456 02/14/24 0354  Weight: 80.5 kg 81.5 kg 83.1 kg    Intake/Output:   Intake/Output Summary (Last 24 hours) at 02/15/2024 1233 Last data filed at 02/15/2024 1021 Gross per 24 hour  Intake 351.41 ml  Output --  Net 351.41 ml     Physical Exam   General: Elderly, chronically ill appearing. Cor: JVP 10-12. Irregular rhythm, tachy. No murmurs. Lungs: breathing nonlabored Abdomen: taut, distended Extremities: no edema, + TED hose Neuro: alert & orientedx3. Affect flat.    Telemetry  AF 110s   Labs    CBC Recent Labs    02/14/24 0420 02/15/24 0505  WBC 6.2 3.3*  HGB 11.3* 10.7*  HCT 35.7*  33.9*  MCV 89.3 89.0  PLT 723* 706*   Basic Metabolic Panel Recent Labs    90/77/74 0420 02/15/24 0505  NA 135 133*  K 4.2 3.8  CL 103 98  CO2 22 21*  GLUCOSE 144* 139*  BUN 30* 37*  CREATININE 1.45* 1.41*  CALCIUM  9.2 9.0  MG 2.4 2.2   Liver Function Tests Recent Labs    02/14/24 0420 02/15/24 0505  AST 17 12*  ALT 20 17  ALKPHOS 79 69  BILITOT 0.4 0.6  PROT 6.6 6.0*  ALBUMIN 3.1* 2.8*    Medications:   Scheduled Medications:  apixaban   5 mg Oral BID   Chlorhexidine  Gluconate Cloth  6 each Topical Q0600   digoxin   0.0625 mg Oral Daily   famotidine   20 mg Oral Daily   feeding supplement  237 mL Oral TID BM   Gerhardt's butt cream   Topical BID   glycopyrrolate   0.1 mg Intravenous Once   insulin  aspart  0-5 Units Subcutaneous QHS   insulin  aspart  0-9 Units Subcutaneous TID WC   melatonin  5 mg Oral QHS   multivitamin with minerals  1 tablet Oral Daily   neostigmine   0.25 mg Subcutaneous Q6H   nystatin    Topical BID   pantoprazole   40 mg Oral BID   rifaximin   550 mg Oral TID   sodium chloride  flush  10-40 mL Intracatheter Q12H  sodium chloride  flush  3 mL Intravenous Q12H   spironolactone   25 mg Oral Daily   ticagrelor   90 mg Oral BID   torsemide   20 mg Oral Daily   traZODone   50 mg Oral QHS    Infusions:  amiodarone  30 mg/hr (02/15/24 1021)     PRN Medications: acetaminophen , ALPRAZolam , atropine , guaiFENesin -dextromethorphan , ipratropium-albuterol , metoCLOPramide  (REGLAN ) injection, mouth rinse, polyethylene glycol, simethicone , sodium chloride  flush, sodium chloride  flush  Patient Profile  Chase Combs is a 78 year old with a history of ICM, chronic biventricular HFrEF, CAD s/p PCI to LAD in 08/25.[name]      Readmitted 01/22/24 with abdominal pain/nausea/vomiting. Hospital course complicated by ischemic colitis w/ septic shock/GI bleed, stent thrombosis involving recent LAD stent and low-output HF.   Assessment/Plan  CAD, recent STEMI d/t stent  thrombosis - 01/17/24- PCI/DES LAD  - 02/01/24 Cangrelor  stopped given Plavix  load and ST elevation. Acute stent thrombosis involving LAD stent treated with PTCA. - Plavix  Genetic Testing collected, pending (sent 9/10, checked with lab 9/22 and still in process) - No chest pain.  - Continue ticagrelor  90mg  BID.  - Off aspirin  with GI bleed.  - Continue high-intensity statin.   New A fib RVR - Continue amiodarone  30/hr. Rate 110s today.  - Given his low output HF, would avoid giving additional IV beta blocker. - Hold off on DCCV until GI issues have resolved - Continue Eliquis  5 BID. Hgb stable. - Continue digoxin  0.0625   A/C HFrEF, ICM   -01/13/24 Echo EF  20-25% RV moderately reduced.   -02/01/24 Echo LVEF 20-25% RV moderately reduced.    - CO-OX 53% with Fick CI 2.2 on 0.125 milrinone . Milrinone  stopped 9/18.  - CO-OX 57% off milrinone . - CVP up to 10. Give 80 mg lasix  IV once in addition to po Torsemide  he already got today. - Off SGLT2i with fungal rash.  - Continue spiro 25 mg daily - Continue digoxin  0.0625 mcg daily. Dig level < 0.6. - No BP room to further titrate GDMT - Renal function stable.    Anemia in the setting of GI Bleed -Transfused 9/8 with appropriate rise.  - 9/10 he had large bloody BM. Unfortunately given 600 mg Ibuprofen .  - Protonix  and Pepcid  per GI - GI following.  - S/p EGD and flex sig.  EGD with 1 superficial esophageal ulcer, LA grade C reflux esophagitis with no bleeding. Flex sig: Red blood in the rectum, sigmoid colon, descending colon and transverse colon. Few diverticula found. No obvious source of bleeding, possibly diverticular per GI. - Hgb stable.   Intestinal pneumatosis w/ concern for ischemic colitis Ileus Psuedoobstruction - Gen surgery recommended conservative management - Treated with antibiotics for suspected septic shock. - Transverse colon measures 9.3 cm on Xray last week.  - CT A/P w/o on 09/22 w/ dilated loops of small bowel and  colon w/ air fluid levels in distal colon and rectum w/ obstruction or pneumatosis - Getting neostigmine  per GI today  Hypokalemia - K 3.8 - Supp with diuresis    Code status: DNR      Length of Stay: 24  [facility], LINDSAY N, PA-C  02/15/2024, 12:33 PM  Advanced Heart Failure Team Pager [phone] (M-F; 7a - 5p)  Please contact [facility] for night-coverage after hours (5p -7a ) and weekends on [email or web address]  Patient seen with PA, I formulated the plan and agree with the above note.   Patient remains in atrial fibrillation rate 100s on amiodarone  30 mg/hr. CVP  10, feels more short of breath than yesterday.   Still with distended abdomen, CT abdomen/pelvis yesterday again showed evidence for significant ileus from small bowel to colon.   General: NAD Neck: JVP 9-10 cm, no thyromegaly or thyroid  nodule.  Lungs: Clear to auscultation bilaterally with normal respiratory effort. CV: Nondisplaced PMI.  Heart irregular S1/S2, no S3/S4, no murmur.  No peripheral edema.   Abdomen: Soft, nontender, no hepatosplenomegaly, severe distention.  Skin: Intact without lesions or rashes.  Neurologic: Alert and oriented x 3.  Psych: Normal affect. Extremities: No clubbing or cyanosis.  HEENT: Normal.   Patient remains in atrial fibrillation. Continue amiodarone  gtt and Eliquis .  Will need eventual DCCV if he does not convert spontaneously. Would like to see resolution of his abdominal process before cardioverting him.    Patient feels more short of breath today, mildly higher at 10.  - Continue digoxin  - Continue spironolactone  25 mg daily.   - He had a dose of po torsemide  today, will give Lasix  80 mg IV x 1 this afternoon. .    S/p stent thrombosis with PTCA.  He is on ticagrelor , no ASA given recent GI bleeding and use of Eliquis .  He is on statin.    Still with ileus.  He will be getting  neostigmine  today.  GI following.   Ezra Shuck 02/15/2024

## 2024-02-15 NOTE — Progress Notes (Signed)
 Sen pt in bed, pt had received MI book from prev admission so I reviewed restrictions, [place], and CR at [facility] MS, ACSM-CEP 02/15/2024 11:29 AM  1040-1100

## 2024-02-15 NOTE — Plan of Care (Signed)
  Problem: Education: Goal: Knowledge of General Education information will improve Description: Including pain rating scale, medication(s)/side effects and non-pharmacologic comfort measures Outcome: Progressing   Problem: Health Behavior/Discharge Planning: Goal: Ability to manage health-related needs will improve Outcome: Progressing   Problem: Clinical Measurements: Goal: Ability to maintain clinical measurements within normal limits will improve Outcome: Progressing Goal: Will remain free from infection Outcome: Progressing Goal: Diagnostic test results will improve Outcome: Progressing Goal: Respiratory complications will improve Outcome: Progressing Goal: Cardiovascular complication will be avoided Outcome: Progressing   Problem: Activity: Goal: Risk for activity intolerance will decrease Outcome: Progressing   Problem: Nutrition: Goal: Adequate nutrition will be maintained Outcome: Progressing   Problem: Coping: Goal: Level of anxiety will decrease Outcome: Progressing   Problem: Elimination: Goal: Will not experience complications related to bowel motility Outcome: Progressing Goal: Will not experience complications related to urinary retention Outcome: Progressing   Problem: Pain Managment: Goal: General experience of comfort will improve and/or be controlled Outcome: Progressing   Problem: Safety: Goal: Ability to remain free from injury will improve Outcome: Progressing   Problem: Skin Integrity: Goal: Risk for impaired skin integrity will decrease Outcome: Progressing   Problem: Education: Goal: Ability to describe self-care measures that may prevent or decrease complications (Diabetes Survival Skills Education) will improve Outcome: Progressing Goal: Individualized Educational Video(s) Outcome: Progressing   Problem: Coping: Goal: Ability to adjust to condition or change in health will improve Outcome: Progressing   Problem: Fluid  Volume: Goal: Ability to maintain a balanced intake and output will improve Outcome: Progressing   Problem: Health Behavior/Discharge Planning: Goal: Ability to identify and utilize available resources and services will improve Outcome: Progressing Goal: Ability to manage health-related needs will improve Outcome: Progressing   Problem: Metabolic: Goal: Ability to maintain appropriate glucose levels will improve Outcome: Progressing   Problem: Nutritional: Goal: Maintenance of adequate nutrition will improve Outcome: Progressing Goal: Progress toward achieving an optimal weight will improve Outcome: Progressing   Problem: Skin Integrity: Goal: Risk for impaired skin integrity will decrease Outcome: Progressing   Problem: Education: Goal: Understanding of CV disease, CV risk reduction, and recovery process will improve Outcome: Progressing Goal: Individualized Educational Video(s) Outcome: Progressing   Problem: Activity: Goal: Ability to return to baseline activity level will improve Outcome: Progressing

## 2024-02-16 ENCOUNTER — Encounter (HOSPITAL_COMMUNITY): Payer: Self-pay | Admitting: Internal Medicine

## 2024-02-16 DIAGNOSIS — N179 Acute kidney failure, unspecified: Secondary | ICD-10-CM | POA: Diagnosis not present

## 2024-02-16 DIAGNOSIS — E876 Hypokalemia: Secondary | ICD-10-CM | POA: Diagnosis not present

## 2024-02-16 DIAGNOSIS — I5023 Acute on chronic systolic (congestive) heart failure: Secondary | ICD-10-CM | POA: Diagnosis not present

## 2024-02-16 DIAGNOSIS — I4891 Unspecified atrial fibrillation: Secondary | ICD-10-CM | POA: Diagnosis not present

## 2024-02-16 DIAGNOSIS — K638219 Small intestinal bacterial overgrowth, unspecified: Secondary | ICD-10-CM

## 2024-02-16 DIAGNOSIS — K5981 Ogilvie syndrome: Secondary | ICD-10-CM | POA: Diagnosis not present

## 2024-02-16 DIAGNOSIS — I2510 Atherosclerotic heart disease of native coronary artery without angina pectoris: Secondary | ICD-10-CM | POA: Diagnosis not present

## 2024-02-16 DIAGNOSIS — K567 Ileus, unspecified: Secondary | ICD-10-CM | POA: Diagnosis not present

## 2024-02-16 LAB — COMPREHENSIVE METABOLIC PANEL WITH GFR
ALT: 14 U/L (ref 0–44)
AST: 14 U/L — ABNORMAL LOW (ref 15–41)
Albumin: 2.7 g/dL — ABNORMAL LOW (ref 3.5–5.0)
Alkaline Phosphatase: 68 U/L (ref 38–126)
Anion gap: 12 (ref 5–15)
BUN: 44 mg/dL — ABNORMAL HIGH (ref 8–23)
CO2: 21 mmol/L — ABNORMAL LOW (ref 22–32)
Calcium: 8.6 mg/dL — ABNORMAL LOW (ref 8.9–10.3)
Chloride: 102 mmol/L (ref 98–111)
Creatinine, Ser: 1.53 mg/dL — ABNORMAL HIGH (ref 0.61–1.24)
GFR, Estimated: 47 mL/min — ABNORMAL LOW (ref >60)
Glucose, Bld: 110 mg/dL — ABNORMAL HIGH (ref 70–99)
Potassium: 3.8 mmol/L (ref 3.5–5.1)
Sodium: 135 mmol/L (ref 135–145)
Total Bilirubin: 0.6 mg/dL (ref 0.0–1.2)
Total Protein: 5.8 g/dL — ABNORMAL LOW (ref 6.5–8.1)

## 2024-02-16 LAB — CBC
HCT: 34.3 % — ABNORMAL LOW (ref 39.0–52.0)
Hemoglobin: 10.8 g/dL — ABNORMAL LOW (ref 13.0–17.0)
MCH: 28 pg (ref 26.0–34.0)
MCHC: 31.5 g/dL (ref 30.0–36.0)
MCV: 88.9 fL (ref 80.0–100.0)
Platelets: 694 K/uL — ABNORMAL HIGH (ref 150–400)
RBC: 3.86 MIL/uL — ABNORMAL LOW (ref 4.22–5.81)
RDW: 20.6 % — ABNORMAL HIGH (ref 11.5–15.5)
WBC: 2.9 K/uL — ABNORMAL LOW (ref 4.0–10.5)
nRBC: 0 % (ref 0.0–0.2)

## 2024-02-16 LAB — GLUCOSE, CAPILLARY
Glucose-Capillary: 116 mg/dL — ABNORMAL HIGH (ref 70–99)
Glucose-Capillary: 166 mg/dL — ABNORMAL HIGH (ref 70–99)

## 2024-02-16 LAB — COOXEMETRY PANEL
Carboxyhemoglobin: 1.9 % — ABNORMAL HIGH (ref 0.5–1.5)
Methemoglobin: 0.7 % (ref 0.0–1.5)
O2 Saturation: 56.2 %
Total hemoglobin: 11.4 g/dL — ABNORMAL LOW (ref 12.0–16.0)

## 2024-02-16 MED ORDER — ROSUVASTATIN CALCIUM 20 MG PO TABS
20.0000 mg | ORAL_TABLET | Freq: Every day | ORAL | Status: DC
  Administered 2024-02-17 – 2024-02-25 (×9): 20 mg via ORAL
  Filled 2024-02-16 (×9): qty 1

## 2024-02-16 NOTE — Progress Notes (Addendum)
 Advanced Heart Failure Rounding Note  Cardiologist: Alm Clay, MD  HF Consulting Cardiologist: Dr. Zenaida  Chief Complaint: Acute on chronic HFrEF  Subjective:   9/9 PTCA LAD d/t stent thrombosis. A fib w/ RVR. CO-OX 44 %. Started on milrinone  0.25 mcg.  9/10:  EGD and flex sig 9/10.  EGD with 1 superficial esophageal ulcer, LA grade C reflux esophagitis with no bleeding. Flex sig: Red blood in the rectum, sigmoid colon, descending colon and transverse colon. Few diverticula found. No obvious source of bleeding.  9/18 Milrinone  stopped.   CT A/P w/o on 09/22 w/ dilated loops of small bowel and colon w/ air fluid levels in distal colon and rectum w/ obstruction or pneumatosis.  GI has started neostigmine . Pt notes improvement. He feels abdomen is less distended. C/w watery BMs. Able to tolerate POs w/o N/V.   CO-OX 56% today off milrinone .   Got IV Lasix  yesterday. ? If I/Os accurate. CVP down to 8-9 today. Daily wts not recorded.   Remains in afib, 110s. On amio gtt at 30/hr. Back on a/c w/ Eliquis . Hgb stable at 10.8.    Objective:   Weight Range: 83.1 kg Body mass index is 24.85 kg/m.   Vital Signs:   Temp:  [97.2 F (36.2 C)-99.4 F (37.4 C)] 98 F (36.7 C) (09/24 0559) Pulse Rate:  [76-108] 96 (09/24 0820) Resp:  [15-20] 20 (09/24 0559) BP: (93-112)/(66-79) 93/74 (09/24 0559) SpO2:  [93 %-97 %] 93 % (09/24 0559) Last BM Date : 02/16/24  Weight change: Filed Weights   02/12/24 0448 02/13/24 0456 02/14/24 0354  Weight: 80.5 kg 81.5 kg 83.1 kg    Intake/Output:   Intake/Output Summary (Last 24 hours) at 02/16/2024 1052 Last data filed at 02/16/2024 0821 Gross per 24 hour  Intake 303.33 ml  Output 600 ml  Net -296.67 ml     Physical Exam   Vitals:   02/16/24 0820 02/16/24 1109  BP:  (!) 106/59  Pulse: 96 (!) 108  Resp:  20  Temp:  98 F (36.7 C)  SpO2:  99%   CVP 8-9  GENERAL: NAD Lungs- decreased BS at the bases R>L CARDIAC:  JVP: 8-9  cm          Irregularly irregular rhythm and rate. Trace b/l pretibial edema  ABDOMEN: markedly distended w/ hypoactive BS, NT   EXTREMITIES: Warm and well perfused.  NEUROLOGIC: No obvious FND  Telemetry   Afib low 100s-110s w/ frequent PVCs, personally reviewed    Labs    CBC Recent Labs    02/15/24 0505 02/16/24 0515  WBC 3.3* 2.9*  HGB 10.7* 10.8*  HCT 33.9* 34.3*  MCV 89.0 88.9  PLT 706* 694*   Basic Metabolic Panel Recent Labs    90/77/74 0420 02/15/24 0505 02/16/24 0515  NA 135 133* 135  K 4.2 3.8 3.8  CL 103 98 102  CO2 22 21* 21*  GLUCOSE 144* 139* 110*  BUN 30* 37* 44*  CREATININE 1.45* 1.41* 1.53*  CALCIUM  9.2 9.0 8.6*  MG 2.4 2.2  --    Liver Function Tests Recent Labs    02/15/24 0505 02/16/24 0515  AST 12* 14*  ALT 17 14  ALKPHOS 69 68  BILITOT 0.6 0.6  PROT 6.0* 5.8*  ALBUMIN 2.8* 2.7*    Medications:   Scheduled Medications:  apixaban   5 mg Oral BID   Chlorhexidine  Gluconate Cloth  6 each Topical Q0600   digoxin   0.0625 mg Oral Daily  famotidine   20 mg Oral Daily   feeding supplement  237 mL Oral TID BM   Gerhardt's butt cream   Topical BID   melatonin  5 mg Oral QHS   multivitamin with minerals  1 tablet Oral Daily   neostigmine   0.25 mg Subcutaneous Q6H   nystatin    Topical BID   pantoprazole   40 mg Oral BID   rifaximin   550 mg Oral TID   sodium chloride  flush  10-40 mL Intracatheter Q12H   sodium chloride  flush  3 mL Intravenous Q12H   spironolactone   25 mg Oral Daily   ticagrelor   90 mg Oral BID   torsemide   20 mg Oral Daily    Infusions:  amiodarone  30 mg/hr (02/16/24 0446)     PRN Medications: acetaminophen , ALPRAZolam , atropine , diphenhydrAMINE , guaiFENesin -dextromethorphan , ipratropium-albuterol , metoCLOPramide  (REGLAN ) injection, mouth rinse, polyethylene glycol, simethicone , sodium chloride  flush, sodium chloride  flush  Patient Profile  Chase Combs is a 78 year old with a history of ICM, chronic biventricular  HFrEF, CAD s/p PCI to LAD in 08/25.      Readmitted 01/22/24 with abdominal pain/nausea/vomiting. Hospital course complicated by ischemic colitis w/ septic shock/GI bleed, stent thrombosis involving recent LAD stent and low-output HF.   Assessment/Plan  CAD, recent STEMI d/t stent thrombosis - 01/17/24- PCI/DES LAD  - 02/01/24 Cangrelor  stopped given Plavix  load and ST elevation. Acute stent thrombosis involving LAD stent treated with PTCA. - Plavix  Genetic Testing collected, pending (sent 9/10, checked with lab 9/22 and still in process) - stable w/o CP  - Continue ticagrelor  90mg  BID.  - Off aspirin  with GI bleed.  - Continue high-intensity statin.   New A fib RVR - Continue amiodarone  30/hr. Rate 110s today.  - Given his low output HF, would avoid giving additional IV beta blocker. - Hold off on DCCV until GI issues have resolved - Continue Eliquis  5 BID. Hgb stable. - Continue digoxin  0.0625   A/C HFrEF, ICM   -01/13/24 Echo EF  20-25% RV moderately reduced.   -02/01/24 Echo LVEF 20-25% RV moderately reduced.    - CO-OX 53% with Fick CI 2.2 on 0.125 milrinone . Milrinone  stopped 9/18.  - CO-OX 56% off milrinone . - CVP 8-9. Continue torsemide  20 mg daily  - Off SGLT2i with fungal rash - Continue spiro 25 mg daily - Continue digoxin  0.0625 mcg daily. Dig level < 0.6. - No BP room to further titrate GDMT - Renal function stable.    Anemia in the setting of GI Bleed -Transfused 9/8 with appropriate rise.  - 9/10 he had large bloody BM. Unfortunately given 600 mg Ibuprofen .  - Protonix  and Pepcid  per GI - GI following.  - S/p EGD and flex sig.  EGD with 1 superficial esophageal ulcer, LA grade C reflux esophagitis with no bleeding. Flex sig: Red blood in the rectum, sigmoid colon, descending colon and transverse colon. Few diverticula found. No obvious source of bleeding, possibly diverticular per GI. - Hgb stable.   Intestinal pneumatosis w/ concern for ischemic  colitis Ileus Psuedoobstruction - Gen surgery recommended conservative management - Treated with antibiotics for suspected septic shock. - Transverse colon measures 9.3 cm on Xray last week.  - CT A/P w/o on 09/22 w/ dilated loops of small bowel and colon w/ air fluid levels in distal colon and rectum w/ obstruction or pneumatosis - Getting neostigmine  per GI. Pt reports interval improvement.   Hypokalemia - Resolved, K 3.8 today  - continue spironolactone .     Code status: DNR  Length of Stay: [address] PA-C  02/16/2024, 10:52 AM  Advanced Heart Failure Team Pager [phone] (M-F; 7a - 5p)  Please contact [facility] for night-coverage after hours (5p -7a ) and weekends on [email or web address]  Patient seen with PA, I formulated the plan and agree with the above note.   Neostigmine  has helped, less distended and has had some watery BMs.    HR remains in 100s, atrial fibrillation on amiodarone  gtt.   Co-ox 56% off milrinone .  Creatinine higher 1.41 => 1.53, had IV Lasix  yesterday.  CVP 8-9 today.   General: NAD Neck: JVP 8 cm, no thyromegaly or thyroid  nodule.  Lungs: Clear to auscultation bilaterally with normal respiratory effort. CV: Nondisplaced PMI.  Heart irregular S1/S2, no S3/S4, no murmur.  No peripheral edema.   Abdomen: Soft, nontender, no hepatosplenomegaly, moderate distention.  Skin: Intact without lesions or rashes.  Neurologic: Alert and oriented x 3.  Psych: Normal affect. Extremities: No clubbing or cyanosis.  HEENT: Normal.   Patient remains in atrial fibrillation. Continue amiodarone  gtt and Eliquis .  Will need eventual DCCV if he does not convert spontaneously. Would like to see resolution of his abdominal process before cardioverting him. Possibly as early as Friday.    CVP 8-9 today with co-ox 56%.  Creatinine mildly higher.   - Continue digoxin  - Continue spironolactone  25 mg daily.   - With rise in creatinine, will just give torsemide   20 today.    S/p stent thrombosis with PTCA.  He is on ticagrelor , no ASA given recent GI bleeding and use of Eliquis .  He is on statin.  Waiting for clopidogrel  genetic testing to see if we can transition to clopidogrel .    Still with ileus but improving.  He will be getting [place] neostigmine  today.  GI following.   Chase Combs 02/16/2024 1:49 PM

## 2024-02-16 NOTE — Assessment & Plan Note (Deleted)
 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 Pt develops cardiogenic shock requiring IV milrinone  and transfer to CICU.  02-02-2024 to 02-09-2024 echo shows LVEF dropped to 20-25%. Pt kept on milrinone  until 02-09-2024 when it was eventually weaned off.

## 2024-02-16 NOTE — Plan of Care (Signed)
  Problem: Education: Goal: Knowledge of General Education information will improve Description: Including pain rating scale, medication(s)/side effects and non-pharmacologic comfort measures Outcome: Progressing   Problem: Health Behavior/Discharge Planning: Goal: Ability to manage health-related needs will improve Outcome: Progressing   Problem: Clinical Measurements: Goal: Ability to maintain clinical measurements within normal limits will improve Outcome: Progressing Goal: Will remain free from infection Outcome: Progressing Goal: Diagnostic test results will improve Outcome: Progressing Goal: Respiratory complications will improve Outcome: Progressing Goal: Cardiovascular complication will be avoided Outcome: Progressing   Problem: Activity: Goal: Risk for activity intolerance will decrease Outcome: Progressing   Problem: Nutrition: Goal: Adequate nutrition will be maintained Outcome: Progressing   Problem: Coping: Goal: Level of anxiety will decrease Outcome: Progressing   Problem: Elimination: Goal: Will not experience complications related to bowel motility Outcome: Progressing Goal: Will not experience complications related to urinary retention Outcome: Progressing   Problem: Pain Managment: Goal: General experience of comfort will improve and/or be controlled Outcome: Progressing   Problem: Safety: Goal: Ability to remain free from injury will improve Outcome: Progressing   Problem: Skin Integrity: Goal: Risk for impaired skin integrity will decrease Outcome: Progressing   Problem: Education: Goal: Ability to describe self-care measures that may prevent or decrease complications (Diabetes Survival Skills Education) will improve Outcome: Progressing Goal: Individualized Educational Video(s) Outcome: Progressing   Problem: Coping: Goal: Ability to adjust to condition or change in health will improve Outcome: Progressing   Problem: Fluid  Volume: Goal: Ability to maintain a balanced intake and output will improve Outcome: Progressing   Problem: Health Behavior/Discharge Planning: Goal: Ability to identify and utilize available resources and services will improve Outcome: Progressing Goal: Ability to manage health-related needs will improve Outcome: Progressing   Problem: Metabolic: Goal: Ability to maintain appropriate glucose levels will improve Outcome: Progressing   Problem: Nutritional: Goal: Maintenance of adequate nutrition will improve Outcome: Progressing Goal: Progress toward achieving an optimal weight will improve Outcome: Progressing

## 2024-02-16 NOTE — Progress Notes (Signed)
 PROGRESS NOTE    Chase Combs  FMW:[id number] DOB: [birth year] DOA: 01/22/2024 PCP: Wonda Worth SQUIBB, PA  Subjective: Pt seen and examined. Has had multiple liquid BM since IV neostigmine  started. Pt feels less distended in his abd. Pt still in rapid afib on amiodarone  gtts.   Hospital Course: CC: weakness, emesis HPI: 78 year old man w/ hx of ischemic cardiomyopathy and very recent LAD stent placement for NSTEMI discharged 8/27 (no d/c summary so unclear) with progressive nausea vomiting with CT images concerning for ischemic colitis.   Feeling weak and unwell since discharge 3 days ago.  Progressive nausea and vomiting not keeping down his antiplatelet medicines.  CT images reveal intestinal air in the wall of the intestine as well as concerning findings of ileus.  His lactate was elevated.  He was volume resuscitated.  Remained hypotensive.  Placed on broad-spectrum antibiotics he received vancomycin  Zosyn  doxycycline and Flagyl .  Labs notable for acute renal failure.  Anion gap metabolic acidosis.  Leukopenia, lymphopenia.  Significant Events: Admitted 01/22/2024 to ICU by PCCM due to intestinal pneumatosis concerning for ischemic colitis 01-24-2024 to 01-27-2024 eneral surgery recommended conservative management.   Concern patient not able to absorb antiplatelet therapy, he was placed on IV cangrelor .  Patient continue to be hypotensive despite IV fluids and was placed on norepinephrine  infusion, consistent with septic shock due to abdominal source/ ischemic colitis.  during hospitalization required PRBC transfusion due to blood loss anemia.   There was concerns of in-stent thrombosis from recent PCI therefore antiplatelets were switched from Cangrelor  to Plavix  and started on milrinone  drip due to cardiogenic shock.   Developed atrial fibrillation with rapid ventricular response, placed on IV amiodarone .  Transition to Aggrastat for anticoagulation.  09/05 GI consulted, clinical picture  consistent with colonic ileus, Ogilvie syndrome.  09/08 PRBC transfusion x1, transitioned to clopidogrel .  09/09 chest pain with new antero lateral ST elevations. Underwent urgent cardiac catheterization, LAD with ostially occluded stent with acute stent thrombosis (Culprit vessel). Successful percutaneous coronary intervention ostial proximal LAD, PTCA with balloon dilatation.  Significant volume overloaded with low SV02 and CVP very high, placed on IV furosemide  and milrinone .  09/10 completed antibiotic course.  09/10 EGD with esophageal ulcer with no bleeding and no stigmata of recent bleeding.  LA grade C reflux esophagitis with no bleeding.  Duodenal erosions without bleeding.  Flexible sigmoidoscopy positive blood in the rectum, in the sigmoid colon, in the descending colon and in the transverse colon. No obvious source of bleed.  Diverticulosis in the sigmoid colon and in the descending colon. No active bleeding.  A single superficial ulcer in the rectum. No active bleeding.   09/12 transition to ticagrelor .  Slowly weaned off TPN, diet as tolerated.   09/19 continue colonic pseudo obstruction, imaging with progression of colonic distention. Started on rifaximin .  Pending cardioversion until bowel function improves.  09/21 pending direct current cardioversion.  09/22 CT abdomen and pelvis dilated loops of small bowel and colon with air fluid levels in the distal colon and rectum. No obstruction. Appearance consistent with ileus or functional bowel disturbance.  09/23 trial of neostigmine    Admission Labs: Na 132, K 4.5, CO2 of 31, BUN 64, scr 2.55, glu 197 T prot 6.2, alb 3.0, AST 29, ALT 31, alk pho 64, t. Bili 1.4 WBC 2.4, HgB 13.9, plt 489 BNP 220 Procalcitonin 1.23  Admission Imaging Studies: CXR Low lung volumes with bibasilar linear and patchy opacities. 2. Small stable bilateral pleural effusion. 3.  Elevated right hemidiaphragm. CT chest/abd/pelvis Significantly improved  multifocal, bilateral pneumonia since 01/14/24. Persistent airspace and ground glass opacities in the right lower lobe, left lower lobe, and posterior left upper lobe/lingula, likely residual pneumonia or aspiration. 2. Gas along the wall of the ascending colon and cecum, suspicious for pneumatosis. Findings called to PA [place] at 3:19 pm. 3. Fluid-filled colon suggests a diarrheal state. Diffuse colonic and small bowel distention likely represent Ileus. 4. Increased small volume pelvic and new right-sided pericolonic trace fluid, likely secondary to the colonic proces. 5. Incidental findings, including: Pulmonary artery enlargement suggests pulmonary arterial hypertension. Aortic and coronary artery atherosclerosis. Right nephrolithiasis. Suspicion of minimal chronic calcific pancreatitis.  Significant Labs:   Significant Imaging Studies: 01-30-2024 CTA GI bleed Severe narrowing is noted at origin of celiac artery with poststenotic dilatation. No definite evidence of contrast extravasation to suggest gastrointestinal bleeding. Mild wall and fold thickening of ascending colon is noted suggesting possible infectious or inflammatory colitis 02-02-2024 echo LVEF 20-25%. eft ventricle has severely decreased function  02-14-2024 CT abd/pelvis Dilated loops of small bowel and colon with air-fluid levels in the distal colon and rectum indicating diarrheal process. No lead point for obstruction is identified. No pneumatosis or free air. Presumably the appearance is due to ileus or functional bowel disturbance. 2. 0.3 cm right kidney lower pole nonobstructive stone. 3. Multilevel lumbar degenerative facet arthropathy with multilevel foraminal impingement. 4. Umbilical hernia contains adipose tissue. 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 5. High density in the gallbladder probably from sludge. 6. Mild bibasilar atelectasis. 7. Aortic Atherosclerosis (ICD10-I70.0). Coronary atherosclerosis.  Prominent cardiomegaly.  Antibiotic Therapy: Anti-infectives (From admission, onward)    Start     Dose/Rate Route Frequency Ordered Stop   02/11/24 1000  rifaximin  (XIFAXAN ) tablet 550 mg        550 mg Oral 3 times daily 02/11/24 0908     02/01/24 1645  cefTRIAXone  (ROCEPHIN ) 2 g in sodium chloride  0.9 % 100 mL IVPB  Status:  Discontinued        2 g 200 mL/hr over 30 Minutes Intravenous Every 24 hours 02/01/24 1553 02/03/24 1029   01/29/24 2200  metroNIDAZOLE  (FLAGYL ) tablet 500 mg  Status:  Discontinued        500 mg Oral Every 12 hours 01/29/24 1627 02/03/24 1029   01/22/24 2200  piperacillin -tazobactam (ZOSYN ) IVPB 3.375 g        3.375 g 12.5 mL/hr over 240 Minutes Intravenous Every 8 hours 01/22/24 1602 01/28/24 2359   01/22/24 1800  metroNIDAZOLE  (FLAGYL ) IVPB 500 mg  Status:  Discontinued        500 mg 100 mL/hr over 60 Minutes Intravenous Every 8 hours 01/22/24 1653 01/23/24 0857   01/22/24 1545  metroNIDAZOLE  (FLAGYL ) IVPB 500 mg  Status:  Discontinued        500 mg 100 mL/hr over 60 Minutes Intravenous  Once 01/22/24 1533 01/22/24 1634   01/22/24 1330  doxycycline (VIBRAMYCIN) 100 mg in sodium chloride  0.9 % 250 mL IVPB  Status:  Discontinued        100 mg 125 mL/hr over 120 Minutes Intravenous  Once 01/22/24 1320 01/22/24 1649   01/22/24 1315  azithromycin (ZITHROMAX) 500 mg in sodium chloride  0.9 % 250 mL IVPB  Status:  Discontinued        500 mg 250 mL/hr over 60 Minutes Intravenous  Once 01/22/24 1309 01/22/24 1319   01/22/24 1315  ceFEPIme  (MAXIPIME ) 2 g in sodium chloride  0.9 % 100 mL  IVPB        2 g 200 mL/hr over 30 Minutes Intravenous  Once 01/22/24 1309 01/22/24 1515   01/22/24 1315  vancomycin  (VANCOREADY) IVPB 1750 mg/350 mL  Status:  Discontinued        1,750 mg 175 mL/hr over 120 Minutes Intravenous  Once 01/22/24 1311 01/22/24 1756       Procedures: 02-01-2024 LHC LM: Distal 45% stenosis,  LAD: Ostially occluded stent with acute stent thrombosis  (Culprit vessel),  [place]: 50% in-stent restenosis,  Lcx: Mild diffuse disease,  RCA: Not engaged today. LVEDP 27 mmHg.  Successful percutaneous coronary intervention ostial prox LAD.  PTCA with serial balloon dilatation, up to 3.0  X 12 mm [place] balloon up to 20 atm  Consultants: PCCM Cardiology General surgery    Assessment and Plan: * Pneumatosis intestinalis of large intestine-resolved as of 02/16/2024 01-22-2024 to 01-29-2024.  admitted to ICU by PCCM. General surgery consulted. CT abd showed pneumatosis. Surgery did not feel that pt was a operative candidate. IV abx started. Pt developed gastric distension and ileus.  Due to N/V and ileus, NG tube was placed for gastric decompression. By 01-26-2024, abd distension improved, NG tube removed and clear liquid diet started.   Atrial fibrillation with RVR (HCC) 02-04-2024 pt started on digoxin  and IV amiodarone  for rate control as pt's blood pressure too low to start any other rate controlled meds due to cardiogenic shock.  02-05-2024 through 02-15-2024 afib rate remains uncontrolled. Due to ongoing colonic ileus, cardiology does not want to cardiovert.  02-16-2024 remains on IV amiodarone  gtts. Rate still in the mid 100s. SBP borderline low at 90-100s  Colonic pseudoobstruction 01-27-2025 GI consulted due to colonic ileus that was not responding to conservative measures. Pt has persistent colonic distension.  GI recommended to continue Flexiseal and to treat empiric treatment with flagyl  for presumed SIBO(small intestine bacterial overgrowth).  02-01-2024 pt develops rectal bleeding and drops his HgB from 9.2 g/dl to 7.3 g/dl.  02-02-2024 pt taken for flex sigmoidoscopy and EGD that shows bright red blood in the colon from the rectum to the transverse colon underlying mucosa appeared normal few diverticuli noted in the left and sigmoid no active bleeding, single nonbleeding superficial ulcer in the rectum.  EGD showed superficial esophageal ulcer  with no bleeding, grade C esophagitis with no bleeding and a few erosions in the first portion of the duodenum  02-03-2024 to 02-10-2024 persistent abd distension despite liquid bowel movements.    02-11-2024 Xifaxan  started for additional SIBO treatment due to persistent abdominal distension.   02-15-2024 after 72 hours of no improvement with treatment with Xifaxan  for SIBO, pt started on IV neostigmine  with IV robinul  to help with colonic decompression.  02-16-2024 GI continuing with IV neostigmine  q6h.  AKI (acute kidney injury) 01-22-2024 through 01-28-2024. Admitted with AKI with Scr of 2.55. with IVF and treatment of his ileus/intestinal pneumatosis, his scr return to 1.07 by 01-28-2024  02-08-2024 through 02-11-2024 he develops another AKI during his cardiogenic shock. By 02-10-2024, scr back to 1.06  02-13-2024 scr back on the rise again after uncontrolled afib and starting of aldcatone/demadex .  Small intestinal bacterial overgrowth (SIBO) 01-27-2025 GI consulted due to colonic ileus that was not responding to conservative measures. Pt has persistent colonic distension.  GI recommended to continue Flexiseal and to treat empiric treatment with flagyl  for presumed SIBO(small intestine bacterial overgrowth).   02-03-2024 to 02-10-2024 persistent abd distension despite liquid bowel movements.     02-11-2024 Xifaxan  started  for additional SIBO treatment due to persistent abdominal distension.    02-15-2024 after 72 hours of no improvement with treatment with Xifaxan  for SIBO, pt started on IV neostigmine  with IV robinul  to help with colonic decompression.   02-16-2024 GI continuing with IV neostigmine  q6h.  Acute on chronic systolic CHF (congestive heart failure) (HCC) 02-02-2024 pt develops cardiogenic shock after STEMI requiring PTCA. Pt placed on milrinone  for cardiovascular support.  02-04-2024 through 02-09-2024 pt started on IV lasix  gtts for diuresis while he has cardiogenic shock to  help remove intravascular volume  02-14-2024 po demadex  and aldactone  started.  Cardiogenic shock (HCC)-resolved as of 02/16/2024 02-01-2024 after his STEMI from in-stent restenosis requiring PCTA on 02-01-2024. Pt develops cardiogenic shock requiring IV milrinone  and transfer to CICU.  02-02-2024 to 02-09-2024 echo shows LVEF dropped to 20-25%. Pt kept on milrinone  until 02-09-2024 when it was eventually weaned off.  Anemia due to acute blood loss-resolved as of 02/16/2024 GI bleed.   SP 4 units PRBC transfusion.   EGD with flexible sigmoidoscopy with red blood in the colon.  Grade 1 esophagitis.  Continue pantoprazole .  Follow up hgb is 11.3   Acute blood loss anemia-resolved as of 02/16/2024 02-01-2024 pt develops rectal bleeding and drops his HgB from 9.2 g/dl to 7.3 g/dl.  02-02-2024 pt transfused 2 units PRBC.  pt taken for flex sigmoidoscopy and EGD that shows bright red blood in the colon from the rectum to the transverse colon underlying mucosa appeared normal few diverticuli noted in the left and sigmoid no active bleeding, single nonbleeding superficial ulcer in the rectum.  EGD showed superficial esophageal ulcer with no bleeding, grade C esophagitis with no bleeding and a few erosions in the first portion of the duodenum  Gastrointestinal hemorrhage-resolved as of 02/16/2024 02-01-2024 pt develops rectal bleeding and drops his HgB from 9.2 g/dl to 7.3 g/dl.   02-02-2024 pt taken for flex sigmoidoscopy and EGD that shows bright red blood in the colon from the rectum to the transverse colon underlying mucosa appeared normal few diverticuli noted in the left and sigmoid no active bleeding, single nonbleeding superficial ulcer in the rectum.  EGD showed superficial esophageal ulcer with no bleeding, grade C esophagitis with no bleeding and a few erosions in the first portion of the duodenum  Type 2 diabetes mellitus with hyperlipidemia (HCC) 02-16-2024 CBG stable without  SSI  Pressure injury of skin Wound 02/01/24 1600 Pressure Injury Buttocks Stage 2 -  Partial thickness loss of dermis presenting as a shallow open injury with a red, pink wound bed without slough. (Active)    Malnutrition of moderate degree Nutrition Status: Nutrition Problem: Moderate Malnutrition Etiology: acute illness Signs/Symptoms: energy intake < 75% for > 7 days, moderate muscle depletion, mild muscle depletion Interventions: Boost Breeze, MVI, Refer to RD note for recommendations    Essential hypertension 02-17-2024 unable to start HTN meds due to low BP  Coronary artery disease involving native coronary artery of native heart without angina pectoris 09-09--2025 pt has in-stent thrombosis causing STEMI. Taken to cath lab for [facility] to PTCA.  02-16-2024 pt on Brilinta . ASA on hold due to GI bleed on 02-02-2024.  Restart crestor  20 mg daily.  ST elevation myocardial infarction involving left main coronary artery (HCC)-resolved as of 02/16/2024 02-01-2024 At around 1:15 PM, patient started having right shoulder pain and started feeling diaphoretic. EKG showed new anterolateral ST elevation. Cardiology felt that pt likely had occluded his recently placed LAD stent with several days of likely  suboptimal platelet inhibition. Pt taken emergently to cath lab where LHC showed LAD: Ostially occluded stent with acute stent thrombosis. Pt subsequently develops cardiogenic shock and requires IV milrinone  for 7 days.     DVT prophylaxis: Place and maintain sequential compression device Start: 02/04/24 1132 Place TED hose Start: 02/04/24 0941 SCD's Start: 02/01/24 1555 apixaban  (ELIQUIS ) tablet 5 mg     Code Status: Do not attempt resuscitation (DNR) PRE-ARREST INTERVENTIONS DESIRED Family Communication: no family at bedside Disposition Plan: unknown. May need SNF vs CIR Reason for continuing need for hospitalization: remains on IV amiodarone  gtts, IV neostigmine .  Objective: Vitals:    02/16/24 0820 02/16/24 1109 02/16/24 1533 02/16/24 1945  BP:  (!) 106/59 101/71 108/79  Pulse: 96 (!) 108 (!) 102 100  Resp:  20 17 18   Temp:  98 F (36.7 C) 98.3 F (36.8 C) 97.6 F (36.4 C)  TempSrc:  Oral Oral Axillary  SpO2:  99% 94% 91%  Weight:      Height:        Intake/Output Summary (Last 24 hours) at 02/16/2024 2018 Last data filed at 02/16/2024 1544 Gross per 24 hour  Intake 361.23 ml  Output --  Net 361.23 ml   Filed Weights   02/12/24 0448 02/13/24 0456 02/14/24 0354  Weight: 80.5 kg 81.5 kg 83.1 kg    Examination:  Physical Exam Vitals and nursing note reviewed.  Constitutional:      Comments: Appears chronically ill  HENT:     Head: Normocephalic and atraumatic.  Cardiovascular:     Rate and Rhythm: Tachycardia present. Rhythm irregular.  Pulmonary:     Effort: Pulmonary effort is normal.     Breath sounds: Normal breath sounds.  Abdominal:     General: There is distension.     Comments: High pitch BS but hypoactive  Musculoskeletal:     Right lower leg: 2+ Edema present.     Left lower leg: 2+ Edema present.     Comments: Bilateral +2 pitting LE edema  Skin:    General: Skin is warm and dry.     Capillary Refill: Capillary refill takes less than 2 seconds.  Neurological:     Mental Status: He is alert and oriented to person, place, and time.     Data Reviewed: I have personally reviewed following labs and imaging studies  CBC: Recent Labs  Lab 02/12/24 0500 02/13/24 0445 02/14/24 0420 02/15/24 0505 02/16/24 0515  WBC 6.1 5.5 6.2 3.3* 2.9*  HGB 11.1* 10.5* 11.3* 10.7* 10.8*  HCT 35.6* 33.2* 35.7* 33.9* 34.3*  MCV 88.6 89.2 89.3 89.0 88.9  PLT 725* 649* 723* 706* 694*   Basic Metabolic Panel: Recent Labs  Lab 02/11/24 0500 02/12/24 0500 02/13/24 0445 02/14/24 0420 02/15/24 0505 02/16/24 0515  NA 135 137 136 135 133* 135  K 3.8 3.6 4.1 4.2 3.8 3.8  CL 99 103 104 103 98 102  CO2 22 20* 22 22 21* 21*  GLUCOSE 143* 135* 141*  144* 139* 110*  BUN 31* 26* 28* 30* 37* 44*  CREATININE 1.06 1.21 1.43* 1.45* 1.41* 1.53*  CALCIUM  8.6* 8.6* 8.8* 9.2 9.0 8.6*  MG 2.4 2.4 2.2 2.4 2.2  --   PHOS 3.2  --   --   --   --   --    GFR: Estimated Creatinine Clearance: 45.1 mL/min (A) (by C-G formula based on SCr of 1.53 mg/dL (H)). Liver Function Tests: Recent Labs  Lab 02/12/24 0500 02/13/24 0445 02/14/24  9579 02/15/24 0505 02/16/24 0515  AST 22 16 17  12* 14*  ALT 24 20 20 17 14   ALKPHOS 80 70 79 69 68  BILITOT 0.8 0.7 0.4 0.6 0.6  PROT 6.4* 6.2* 6.6 6.0* 5.8*  ALBUMIN 2.9* 2.9* 3.1* 2.8* 2.7*    BNP (last 3 results) Recent Labs    01/11/24 0539 01/13/24 0537 01/22/24 1243  BNP 842.8* 1,747.3* 220.4*   CBG: Recent Labs  Lab 02/15/24 0603 02/15/24 1107 02/15/24 1559 02/15/24 2100 02/16/24 0601  GLUCAP 151* 146* 116* 131* 116*   Sepsis Labs: Recent Labs  Lab 02/10/24 1015  LATICACIDVEN 1.2   Scheduled Meds:  apixaban   5 mg Oral BID   Chlorhexidine  Gluconate Cloth  6 each Topical Q0600   digoxin   0.0625 mg Oral Daily   famotidine   20 mg Oral Daily   feeding supplement  237 mL Oral TID BM   Gerhardt's butt cream   Topical BID   melatonin  5 mg Oral QHS   multivitamin with minerals  1 tablet Oral Daily   neostigmine   0.25 mg Subcutaneous Q6H   nystatin    Topical BID   pantoprazole   40 mg Oral BID   rifaximin   550 mg Oral TID   [START ON 02/17/2024] rosuvastatin   20 mg Oral Daily   sodium chloride  flush  10-40 mL Intracatheter Q12H   sodium chloride  flush  3 mL Intravenous Q12H   spironolactone   25 mg Oral Daily   ticagrelor   90 mg Oral BID   torsemide   20 mg Oral Daily   Continuous Infusions:  amiodarone  30 mg/hr (02/16/24 1544)     LOS: 25 days   Time spent: 120 minutes in chart review, summarization of hospital course and management today.  Camellia Door, DO  [facility]  02/16/2024, 8:18 PM

## 2024-02-16 NOTE — Progress Notes (Signed)
 Progress Note   Assessment    78 year old male with a complicated coronary history with recent STEMI due to in-stent thrombosis, HFrEF, new onset atrial fibrillation with RVR, hospitalization complicated by probable ischemic colitis with transient lower GI bleeding, small bowel ileus and colonic pseudoobstruction now subacute  Principal Problem:   Intestinal ischemia Active Problems:   Coronary artery disease involving native coronary artery of native heart without angina pectoris   Essential hypertension   Malnutrition of moderate degree   Atrial fibrillation with RVR (HCC)   Anemia due to acute blood loss   AKI (acute kidney injury)   Type 2 diabetes mellitus with hyperlipidemia (HCC)   Recommendations  Subacute colonic pseudoobstruction/small bowel ileus --so far a favorable response to subcutaneous neostigmine  x 4 doses.  No apparent bradycardia or complication.  Given clinical response I do not think decompressive lower endoscopy is needed.  Continue current plan -- Neostigmine  subcutaneous 0.25 mg every 6 hours -- Continue current diet -- Frequent ambulation -- Avoid narcotics and other anticholinergic medications -- Close attention to electrolytes  2.  Question of SIBO --continue rifaximin  550 mg 3 times daily x 14 days -- Rifaximin  550 mg 3 times daily x 14 days started on 02/11/2024  3. Lower GI bleed/anemia --no recurrent lower GI bleeding despite DOAC and antiplatelet therapy. --Continue twice daily PPI --Close attention to hemoglobin  4.  CAD with recent STEMI (stent thrombosis)/new A-fib with RVR/HFrEF --on Brilinta , aspirin , amiodarone , Eliquis , digoxin  -- All per heart failure team  35 minutes total spent today including patient facing time, coordination of care, reviewing medical history/procedures/pertinent radiology studies, and documentation of the encounter.   Chief Complaint  Subcutaneous neostigmine  x 4 doses Patient feels considerably better today  with less abdominal distention and pain Episodes of flatus and liquid stool Working with PT now  Vital signs in last 24 hours: Temp:  [97.2 F (36.2 C)-99.4 F (37.4 C)] 98 F (36.7 C) (09/24 0559) Pulse Rate:  [76-108] 96 (09/24 0820) Resp:  [15-20] 20 (09/24 0559) BP: (93-112)/(66-79) 93/74 (09/24 0559) SpO2:  [93 %-97 %] 93 % (09/24 0559) Last BM Date : 02/16/24 Gen: awake, alert, NAD HEENT: anicteric  CV: irreg, irreg Pulm: CTA b/l Abd: Abdomen remains distended but somewhat softer today, positive bowel sounds, umbilical hernia noted Ext: no c/c/e Neuro: nonfocal   Intake/Output from previous day: 09/23 0701 - 09/24 0700 In: 376.3 [I.V.:376.3] Out: 600 [Urine:600] Intake/Output this shift: Total I/O In: 10 [I.V.:10] Out: -   Lab Results: Recent Labs    02/14/24 0420 02/15/24 0505 02/16/24 0515  WBC 6.2 3.3* 2.9*  HGB 11.3* 10.7* 10.8*  HCT 35.7* 33.9* 34.3*  PLT 723* 706* 694*   BMET Recent Labs    02/14/24 0420 02/15/24 0505 02/16/24 0515  NA 135 133* 135  K 4.2 3.8 3.8  CL 103 98 102  CO2 22 21* 21*  GLUCOSE 144* 139* 110*  BUN 30* 37* 44*  CREATININE 1.45* 1.41* 1.53*  CALCIUM  9.2 9.0 8.6*   LFT Recent Labs    02/16/24 0515  PROT 5.8*  ALBUMIN 2.7*  AST 14*  ALT 14  ALKPHOS 68  BILITOT 0.6   Studies/Results: CT ABDOMEN PELVIS WO CONTRAST Result Date: 02/14/2024 CLINICAL DATA:  Abdominal distension, colonic ileus EXAM: CT ABDOMEN AND PELVIS WITHOUT CONTRAST TECHNIQUE: Multidetector CT imaging of the abdomen and pelvis was performed following the standard protocol without IV contrast. RADIATION DOSE REDUCTION: This exam was performed according to the departmental dose-optimization  program which includes automated exposure control, adjustment of the mA and/or kV according to patient size and/or use of iterative reconstruction technique. COMPARISON:  Radiographs 02/10/2024 and CT scan 01/30/2024 FINDINGS: Lower chest: Right middle lobe and right  lower lobe atelectasis along the right hemidiaphragm. Mild lingular and left lower lobe atelectasis along the left hemidiaphragm. Prominent cardiomegaly. Coronary and aortic atherosclerosis. Possible central venous catheter tip the cavoatrial junction. Hepatobiliary: High density in the gallbladder probably from sludge. Otherwise unremarkable. Pancreas: Unremarkable Spleen: Unremarkable Adrenals/Urinary Tract: Stable renal cysts. 0.3 cm right kidney lower pole calcification suspicious for nonobstructive stone. No hydronephrosis or hydroureter. Right adrenal nodularity without overt mass, stable. Stomach/Bowel: Moderately distended stomach with air-fluid level. Scattered dilated loops of small bowel, index lesion in the pelvis 4.1 cm diameter. Dilated colon with air-fluid levels. Air-fluid levels noted in the distal colon and rectum indicating diarrheal process. Sigmoid colon diverticulosis without active diverticulitis. No lead point for obstruction is identified. No obvious twisted bowel or volvulus. No pneumatosis observed. Vascular/Lymphatic: Atherosclerosis is present, including aortoiliac atherosclerotic disease. Atheromatous plaque noted proximally in the superior mesenteric artery. Vascular patency not assessed on today's noncontrast CT examination although the SMA was patent on a CT angiogram of 01/30/2024. Narrowing of the proximal celiac trunk possibly related to median arcuate ligament. Reproductive: Fiducials along the mildly enlarged prostate gland. Prominent median lobe indents the bladder base. Other: No supplemental non-categorized findings. Musculoskeletal: Grade 1 degenerative anterolisthesis L4-5. Multilevel lumbar degenerative facet arthropathy with foraminal impingement bilaterally at L3-4 and on the right at L1-2, L2-3, and L4-5 as well. Umbilical hernia contains adipose tissue. 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 IMPRESSION: 1. Dilated loops of small bowel and colon with  air-fluid levels in the distal colon and rectum indicating diarrheal process. No lead point for obstruction is identified. No pneumatosis or free air. Presumably the appearance is due to ileus or functional bowel disturbance. 2. 0.3 cm right kidney lower pole nonobstructive stone. 3. Multilevel lumbar degenerative facet arthropathy with multilevel foraminal impingement. 4. Umbilical hernia contains adipose tissue. 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 5. High density in the gallbladder probably from sludge. 6. Mild bibasilar atelectasis. 7. Aortic Atherosclerosis (ICD10-I70.0). Coronary atherosclerosis. Prominent cardiomegaly. Electronically Signed   By: Ryan Salvage M.D.   On: 02/14/2024 16:42      LOS: 25 days   Gordy CHRISTELLA Starch, MD 02/16/2024, 10:19 AM See TRACEY,  GI, to contact our on call provider

## 2024-02-16 NOTE — Progress Notes (Signed)
 Inpatient Rehab Admissions Coordinator:   Possible DCCV late this week.  Will continue to follow for readiness to consider CIR.    Reche Lowers, PT, DPT Admissions Coordinator [phone] 02/16/24  3:37 PM

## 2024-02-16 NOTE — Assessment & Plan Note (Deleted)
 01-27-2025 GI consulted due to colonic ileus that was not responding to conservative measures. Pt has persistent colonic distension.  GI recommended to continue Flexiseal and to treat empiric treatment with flagyl  for presumed SIBO(small intestine bacterial overgrowth).   02-03-2024 to 02-10-2024 persistent abd distension despite liquid bowel movements.     02-11-2024 Xifaxan  started for additional SIBO treatment due to persistent abdominal distension.    02-15-2024 after 72 hours of no improvement with treatment with Xifaxan  for SIBO, pt started on IV neostigmine  with IV robinul  to help with colonic decompression.   02-16-2024 GI continuing with SQ neostigmine  q6h.  02/17/24 remains on Xifaxan  TID. GI treating him for 14 days. End date 02-24-2024. During prior admission, flagyl  seemed to work but no longer.  02/18/24 continues with Xifaxan  TID. End date of therapy 02-24-2024.  02/19/24 continues on Xifaxan . End date of therapy 02-24-2024. This will completed 14 days of treatment.  02/20/24 on Xifaxan . End date 02-24-2024. This is 14 days of therapy.  02/22/24 continue on Xifaxan . End date of therapy 02-24-2024.

## 2024-02-16 NOTE — Assessment & Plan Note (Deleted)
 01-27-2025 GI consulted due to colonic ileus that was not responding to conservative measures. Pt has persistent colonic distension.  GI recommended to continue Flexiseal and to treat empiric treatment with flagyl  for presumed SIBO(small intestine bacterial overgrowth).  02-01-2024 pt develops rectal bleeding and drops his HgB from 9.2 g/dl to 7.3 g/dl.  02-02-2024 pt taken for flex sigmoidoscopy and EGD that shows bright red blood in the colon from the rectum to the transverse colon underlying mucosa appeared normal few diverticuli noted in the left and sigmoid no active bleeding, single nonbleeding superficial ulcer in the rectum.  EGD showed superficial esophageal ulcer with no bleeding, grade C esophagitis with no bleeding and a few erosions in the first portion of the duodenum  02-03-2024 to 02-10-2024 persistent abd distension despite liquid bowel movements.    02-11-2024 Xifaxan  started for additional SIBO treatment due to persistent abdominal distension.   02-15-2024 after 72 hours of no improvement with treatment with Xifaxan  for SIBO, pt started on SQ neostigmine  with IV robinul  to help with colonic decompression.  02-16-2024 GI continuing with SQ neostigmine  q6h.  02-17-2024 GI managing colonic ileus. Abd XR ordered. Pt may need endoscopic decompression. GI has made pt NPO.  02/18/24 Pt states he has not had any BM overnight. Appears he was given 1 mg IV neostigmine  instead of SQ injection. He states he was extremely nauseated with IV neostigmine . Today, Abd XR still show extremely dilated colon. XR no changes from yesterday.  02-19-2024 s/p colonoscopy with decompression tube placed yesterday. Neostigmine  stopped on 02-18-2024.  02/20/24 still doing well. Having liquid Bms. Abd remains soft. GI to decide when colonic decompression tube can be removed.  02/21/24 still with colonic decompression tube. Passing flatus and liquid stools. Abd is nice and soft now.  02/22/24 GI has  advanced his diet to regular foot. Still has colonic decompression tube. Abd remains soft.

## 2024-02-16 NOTE — Assessment & Plan Note (Addendum)
 Wound 02/01/24 1600 Pressure Injury Buttocks Stage 2 -  Partial thickness loss of dermis presenting as a shallow open injury with a red, pink wound bed without slough. (Active)

## 2024-02-16 NOTE — Subjective & Objective (Addendum)
 Pt seen and examined. Still doing well from abd distension perspective. Having liquid BM every 2 hours. Tolerating clear liquids. Still in rapid afib. On IV amiodarone  gtts.

## 2024-02-16 NOTE — Progress Notes (Signed)
 Occupational Therapy Treatment Patient Details Name: Chase Combs MRN: [id number] DOB: 1945-10-14 Today's Date: 02/16/2024   History of present illness 78 year old man admitted 8/30 with progressive nausea vomiting with CT images concerning for ischemic colitis; pneumatosis intestinalis/ileus. 9/9  recurrent STEMI with in-stent stenosis.[name]  PMH: ischemic cardiomyopathy and very recent LAD stent placement for NSTEMI discharged 8/27.   OT comments  Pt presented in bed and agreeable to session. He wanted to complete toileting on [facility] and required CGA with RW to step pivot with max assist with peri care. He then was able to complete oral care in standing and then attempted to go for a short walker with RW and CGA about 20 ft. Patient will benefit from intensive inpatient follow-up therapy, >3 hours/day.  BP: Supine: 111/74 (88) Sitting: 103/78 (88) Post toileting: 98/75 (83) Post oral care: 95/66 (75) Post ambulation: 103/61 (75) HR 90-122.       If plan is discharge home, recommend the following:  A little help with walking and/or transfers;A little help with bathing/dressing/bathroom;Assistance with cooking/housework;Assist for transportation;Help with stairs or ramp for entrance   Equipment Recommendations   (TBD at next venue)    Recommendations for Other Services      Precautions / Restrictions Precautions Precautions: Fall Recall of Precautions/Restrictions: Intact Precaution/Restrictions Comments: fear of falls; not on O2 at baseline Restrictions Weight Bearing Restrictions Per Provider Order: No Other Position/Activity Restrictions: monitor O2       Mobility Bed Mobility Overal bed mobility: Modified Independent Bed Mobility: Supine to Sit     Supine to sit: Contact guard Sit to supine: Supervision        Transfers Overall transfer level: Needs assistance Equipment used: Rolling walker (2 wheels) Transfers: Sit to/from Stand Sit to Stand: Contact guard  assist           General transfer comment: Pt able to increase in use of BUE for hand placement in session     Balance Overall balance assessment: Needs assistance Sitting-balance support: Feet supported Sitting balance-Leahy Scale: Good     Standing balance support: Bilateral upper extremity supported, Single extremity supported, No upper extremity supported Standing balance-Leahy Scale: Fair Standing balance comment: Reliant on RW                           ADL either performed or assessed with clinical judgement   ADL Overall ADL's : Needs assistance/impaired Eating/Feeding: Independent;Sitting   Grooming: Set up;Sitting   Upper Body Bathing: Set up;Sitting   Lower Body Bathing: Sit to/from stand;Sitting/lateral leans;Maximal assistance Lower Body Bathing Details (indicate cue type and reason): needs assist with socks/shoes Upper Body Dressing : Set up;Sitting   Lower Body Dressing: Minimal assistance;Sit to/from stand   Toilet Transfer: Contact guard assist   Toileting- Clothing Manipulation and Hygiene: Maximal assistance;Sit to/from stand       Functional mobility during ADLs: Rolling walker (2 wheels)      Extremity/Trunk Assessment Upper Extremity Assessment Upper Extremity Assessment: Generalized weakness   Lower Extremity Assessment Lower Extremity Assessment: Defer to PT evaluation        Vision       Perception     Praxis     Communication Communication Communication: No apparent difficulties Factors Affecting Communication: Hearing impaired   Cognition Arousal: Alert Behavior During Therapy: WFL for tasks assessed/performed Cognition: No apparent impairments             OT - Cognition Comments:  Mild cog deficits including problem solving and STM but overall WFL                 Following commands: Intact        Cueing   Cueing Techniques: Verbal cues, Gestural cues  Exercises      Shoulder Instructions        General Comments      Pertinent Vitals/ Pain       Pain Assessment Pain Assessment: 0-10 Pain Score: 1  Faces Pain Scale: No hurt Facial Expression: Relaxed, neutral Body Movements: Absence of movements Muscle Tension: Relaxed Compliance with ventilator (intubated pts.): N/A Vocalization (extubated pts.): N/A CPOT Total: 0 Pain Location: stomach Pain Descriptors / Indicators: Discomfort Pain Intervention(s): Limited activity within patient's tolerance, Monitored during session  Home Living                                          Prior Functioning/Environment              Frequency  Min 2X/week        Progress Toward Goals  OT Goals(current goals can now be found in the care plan section)  Progress towards OT goals: Progressing toward goals  Acute Rehab OT Goals OT Goal Formulation: With patient Time For Goal Achievement: 03/01/24 Potential to Achieve Goals: Good ADL Goals Pt Will Perform Grooming: with modified independence;sitting Pt Will Perform Lower Body Bathing: with modified independence;with adaptive equipment;sit to/from stand Pt Will Perform Lower Body Dressing: with modified independence;with adaptive equipment;sit to/from stand;sitting/lateral leans Pt Will Transfer to Toilet: with modified independence;ambulating Pt Will Perform Toileting - Clothing Manipulation and hygiene: with supervision;sit to/from stand  Plan      Co-evaluation                 AM-PAC OT 6 Clicks Daily Activity     Outcome Measure   Help from another person eating meals?: None Help from another person taking care of personal grooming?: None Help from another person toileting, which includes using toliet, bedpan, or urinal?: A Little Help from another person bathing (including washing, rinsing, drying)?: A Little Help from another person to put on and taking off regular upper body clothing?: None Help from another person to put on and  taking off regular lower body clothing?: A Little 6 Click Score: 21    End of Session Equipment Utilized During Treatment: Gait belt;Rolling walker (2 wheels)  OT Visit Diagnosis: Unsteadiness on feet (R26.81);Muscle weakness (generalized) (M62.81)   Activity Tolerance Patient tolerated treatment well   Patient Left in chair;with call bell/phone within reach;with chair alarm set   Nurse Communication Mobility status        Time: 9071-8981 OT Time Calculation (min): 50 min  Charges: OT General Charges $OT Visit: 1 Visit OT Treatments $Self Care/Home Management : 38-52 mins  Chase Combs OTR/L  Acute Rehab Services  [phone] office number   Chase Berber 02/16/2024, 10:25 AM

## 2024-02-16 NOTE — Assessment & Plan Note (Deleted)
 02-01-2024 pt develops rectal bleeding and drops his HgB from 9.2 g/dl to 7.3 g/dl.   02-02-2024 pt taken for flex sigmoidoscopy and EGD that shows bright red blood in the colon from the rectum to the transverse colon underlying mucosa appeared normal few diverticuli noted in the left and sigmoid no active bleeding, single nonbleeding superficial ulcer in the rectum.  EGD showed superficial esophageal ulcer with no bleeding, grade C esophagitis with no bleeding and a few erosions in the first portion of the duodenum

## 2024-02-16 NOTE — Assessment & Plan Note (Deleted)
 02-01-2024 pt develops rectal bleeding and drops his HgB from 9.2 g/dl to 7.3 g/dl.  02-02-2024 pt transfused 2 units PRBC.  pt taken for flex sigmoidoscopy and EGD that shows bright red blood in the colon from the rectum to the transverse colon underlying mucosa appeared normal few diverticuli noted in the left and sigmoid no active bleeding, single nonbleeding superficial ulcer in the rectum.  EGD showed superficial esophageal ulcer with no bleeding, grade C esophagitis with no bleeding and a few erosions in the first portion of the duodenum

## 2024-02-16 NOTE — Assessment & Plan Note (Deleted)
 02-01-2024 At around 1:15 PM, patient started having right shoulder pain and started feeling diaphoretic. EKG showed new anterolateral ST elevation. Cardiology felt that pt likely had occluded his recently placed LAD stent with several days of likely suboptimal platelet inhibition. Pt taken emergently to cath lab where LHC showed LAD: Ostially occluded stent with acute stent thrombosis. Pt subsequently develops cardiogenic shock and requires IV milrinone  for 7 days.

## 2024-02-17 ENCOUNTER — Inpatient Hospital Stay (HOSPITAL_COMMUNITY)

## 2024-02-17 DIAGNOSIS — N179 Acute kidney failure, unspecified: Secondary | ICD-10-CM | POA: Diagnosis not present

## 2024-02-17 DIAGNOSIS — K6389 Other specified diseases of intestine: Secondary | ICD-10-CM | POA: Diagnosis not present

## 2024-02-17 DIAGNOSIS — K5981 Ogilvie syndrome: Secondary | ICD-10-CM | POA: Diagnosis not present

## 2024-02-17 DIAGNOSIS — K567 Ileus, unspecified: Secondary | ICD-10-CM | POA: Diagnosis not present

## 2024-02-17 DIAGNOSIS — I5023 Acute on chronic systolic (congestive) heart failure: Secondary | ICD-10-CM | POA: Diagnosis not present

## 2024-02-17 DIAGNOSIS — I4891 Unspecified atrial fibrillation: Secondary | ICD-10-CM | POA: Diagnosis not present

## 2024-02-17 LAB — CBC
HCT: 34.9 % — ABNORMAL LOW (ref 39.0–52.0)
Hemoglobin: 11.2 g/dL — ABNORMAL LOW (ref 13.0–17.0)
MCH: 28.1 pg (ref 26.0–34.0)
MCHC: 32.1 g/dL (ref 30.0–36.0)
MCV: 87.5 fL (ref 80.0–100.0)
Platelets: 698 K/uL — ABNORMAL HIGH (ref 150–400)
RBC: 3.99 MIL/uL — ABNORMAL LOW (ref 4.22–5.81)
RDW: 20.1 % — ABNORMAL HIGH (ref 11.5–15.5)
WBC: 4.4 K/uL (ref 4.0–10.5)
nRBC: 0 % (ref 0.0–0.2)

## 2024-02-17 LAB — COMPREHENSIVE METABOLIC PANEL WITH GFR
ALT: 15 U/L (ref 0–44)
AST: 14 U/L — ABNORMAL LOW (ref 15–41)
Albumin: 2.9 g/dL — ABNORMAL LOW (ref 3.5–5.0)
Alkaline Phosphatase: 73 U/L (ref 38–126)
Anion gap: 11 (ref 5–15)
BUN: 41 mg/dL — ABNORMAL HIGH (ref 8–23)
CO2: 23 mmol/L (ref 22–32)
Calcium: 8.7 mg/dL — ABNORMAL LOW (ref 8.9–10.3)
Chloride: 100 mmol/L (ref 98–111)
Creatinine, Ser: 1.47 mg/dL — ABNORMAL HIGH (ref 0.61–1.24)
GFR, Estimated: 49 mL/min — ABNORMAL LOW (ref >60)
Glucose, Bld: 134 mg/dL — ABNORMAL HIGH (ref 70–99)
Potassium: 3.3 mmol/L — ABNORMAL LOW (ref 3.5–5.1)
Sodium: 134 mmol/L — ABNORMAL LOW (ref 135–145)
Total Bilirubin: 0.4 mg/dL (ref 0.0–1.2)
Total Protein: 6.2 g/dL — ABNORMAL LOW (ref 6.5–8.1)

## 2024-02-17 LAB — COOXEMETRY PANEL
Carboxyhemoglobin: 2.2 % — ABNORMAL HIGH (ref 0.5–1.5)
Methemoglobin: <0.7 % (ref 0.0–1.5)
O2 Saturation: 56.6 %
Total hemoglobin: 11.6 g/dL — ABNORMAL LOW (ref 12.0–16.0)

## 2024-02-17 MED ORDER — MAGIC MOUTHWASH
5.0000 mL | Freq: Four times a day (QID) | ORAL | Status: DC
  Administered 2024-02-17 – 2024-02-25 (×23): 5 mL via ORAL
  Filled 2024-02-17 (×36): qty 5

## 2024-02-17 MED ORDER — TORSEMIDE 20 MG PO TABS
40.0000 mg | ORAL_TABLET | Freq: Every day | ORAL | Status: DC
Start: 2024-02-18 — End: 2024-02-23
  Administered 2024-02-18 – 2024-02-22 (×5): 40 mg via ORAL
  Filled 2024-02-17 (×5): qty 2

## 2024-02-17 MED ORDER — POTASSIUM CHLORIDE CRYS ER 20 MEQ PO TBCR
40.0000 meq | EXTENDED_RELEASE_TABLET | ORAL | Status: DC
  Filled 2024-02-17: qty 2

## 2024-02-17 MED ORDER — NEOSTIGMINE METHYLSULFATE 10 MG/10ML IV SOLN
2.0000 mg | Freq: Once | INTRAVENOUS | Status: AC
  Administered 2024-02-17: 2 mg via INTRAVENOUS
  Filled 2024-02-17: qty 2

## 2024-02-17 MED ORDER — FUROSEMIDE 10 MG/ML IJ SOLN
80.0000 mg | Freq: Once | INTRAMUSCULAR | Status: AC
  Administered 2024-02-17: 80 mg via INTRAVENOUS
  Filled 2024-02-17: qty 8

## 2024-02-17 MED ORDER — GLYCOPYRROLATE 0.2 MG/ML IJ SOLN
0.1000 mg | Freq: Once | INTRAMUSCULAR | Status: AC
Start: 2024-02-17 — End: 2024-02-17
  Administered 2024-02-17: 0.1 mg via INTRAVENOUS
  Filled 2024-02-17: qty 0.5

## 2024-02-17 MED ORDER — GLYCOPYRROLATE 0.2 MG/ML IJ SOLN: 0.1000 mg | Freq: Once | INTRAMUSCULAR | Status: DC

## 2024-02-17 MED ORDER — POTASSIUM CHLORIDE 20 MEQ PO PACK
20.0000 meq | PACK | Freq: Once | ORAL | Status: AC
  Administered 2024-02-17: 20 meq via ORAL
  Filled 2024-02-17: qty 1

## 2024-02-17 MED ORDER — POTASSIUM CHLORIDE 20 MEQ PO PACK
40.0000 meq | PACK | ORAL | Status: AC
  Administered 2024-02-17: 40 meq via ORAL
  Filled 2024-02-17: qty 2

## 2024-02-17 NOTE — Progress Notes (Signed)
 Patient ID: Chase Combs, male   DOB: 18-May-1946, 78 y.o.   MRN: [id number]    Progress Note   Subjective   Day # 25 CC; persistent small bowel ileus/pseudoobstruction and setting of very recent LAD stent with in-stent stenosis,/NSTEMI 12/2023, ischemic colitis, rapid A-fib  Neostigmine  every 6 hours  Labs today-WBC 4.4/hemoglobin 11.2/hematocrit 34.9/platelets 698 Sodium 134/potassium 3.3/BUN 41/creatinine 1.47 Co. ox panel/O2 sat 56  Last CT 02/14/2024 without contrast right middle lobe and right lower lobe atelectasis along the right hemidiaphragm, gallbladder sludge dilated loops of small bowel and colon with air-fluid levels in the distal colon and rectum indicating diarrheal process no pneumatosis or free air presumed appearance due to ileus or functional bowel disturbance.  Patient says his abdomen feels more distended today.  He did have a diarrheal stool last night but has not had any bowel movements through the night or this morning so far.  He says his appetite is still poor and everything tastes bad, some nausea.  No real abdominal pain just tight and bloated   Objective   Vital signs in last 24 hours: Temp:  [97.3 F (36.3 C)-98.3 F (36.8 C)] 97.3 F (36.3 C) (09/25 0723) Pulse Rate:  [95-108] 99 (09/25 0723) Resp:  [17-20] 20 (09/25 0723) BP: (101-118)/(59-88) 106/88 (09/25 0723) SpO2:  [91 %-99 %] 95 % (09/25 0723) Last BM Date : 02/16/24 General:    Elderly white male in NAD-wife participated by phone Heart:  irRegular rate and rhythm; no murmurs Lungs: Respirations even and unlabored, lungs CTA bilaterally Abdomen: Distended, fairly tight tympanitic, few bowel sounds are present, no focal tenderness Extremities:  Without edema. Neurologic:  Alert and oriented,  grossly normal neurologically. Psych:  Cooperative. Normal mood and affect.  Intake/Output from previous day: 09/24 0701 - 09/25 0700 In: 416.3 [I.V.:416.3] Out: -  Intake/Output this shift: Total  I/O In: 240 [P.O.:240] Out: -   Lab Results: Recent Labs    02/15/24 0505 02/16/24 0515 02/17/24 0510  WBC 3.3* 2.9* 4.4  HGB 10.7* 10.8* 11.2*  HCT 33.9* 34.3* 34.9*  PLT 706* 694* 698*   BMET Recent Labs    02/15/24 0505 02/16/24 0515 02/17/24 0510  NA 133* 135 134*  K 3.8 3.8 3.3*  CL 98 102 100  CO2 21* 21* 23  GLUCOSE 139* 110* 134*  BUN 37* 44* 41*  CREATININE 1.41* 1.53* 1.47*  CALCIUM  9.0 8.6* 8.7*   LFT Recent Labs    02/17/24 0510  PROT 6.2*  ALBUMIN 2.9*  AST 14*  ALT 15  ALKPHOS 73  BILITOT 0.4   PT/INR No results for input(s): LABPROT, INR in the last 72 hours.      Assessment / Plan:    #25 78 year old white male with recent NSTEMI secondary to in-stent thrombosis, heart failure with reduced EF, new onset A-fib with RVR.  This hospitalization complicated by probable ischemic colitis and transient lower GI bleeding in setting of Brilinta , aspirin , and Eliquis . Course complicated by small bowel and colonic ileus, versus pseudoobstruction  He has been initiated on neostigmine  over the past 48 hours.  Initially had good response, however has not had any bowel movements through the night or this a.m. and feels more distended today  #2 possible SIBO, on course of Xifaxan  550 3 times daily x 14 days #3 recent lower GI bleeding felt secondary to right sided ischemic colitis-stable no active bleeding #4 mild hypokalemia-correct  Plan; n.p.o. except sips of water  Plain abdominal films this a.m. [place]  enema to vent bowel Replace potassium May need colonic decompression pending results of today's films.     Active Problems:   Coronary artery disease involving native coronary artery of native heart without angina pectoris   Essential hypertension   Acute on chronic systolic CHF (congestive heart failure) (HCC)   Malnutrition of moderate degree   Colonic pseudoobstruction   Small intestinal bacterial overgrowth (SIBO)   Atrial  fibrillation with RVR (HCC)   Pressure injury of skin   AKI (acute kidney injury)   Type 2 diabetes mellitus with hyperlipidemia (HCC)     LOS: 26 days   Aeriana Speece PA-C 02/17/2024, 9:18 AM

## 2024-02-17 NOTE — Progress Notes (Signed)
 Physical Therapy Treatment Patient Details Name: Chase Combs MRN: [id number] DOB: 1945/10/17 Today's Date: 02/17/2024   History of Present Illness 78 year old man admitted 8/30 with progressive nausea vomiting with CT images concerning for ischemic colitis; pneumatosis intestinalis/ileus. 9/9  recurrent STEMI with in-stent stenosis.[name]  PMH: ischemic cardiomyopathy and very recent LAD stent placement for NSTEMI discharged 8/27.    PT Comments  Pt reports compliance with HEP and defers exercises/gait due to increased abdominal symptoms today, pt agreeable to therapy session to work on transfer training. Pt reports recent enema and some bowel urgency, needing up to CGA for transfer EOB>BSC with RW. HR elevated to 120 bpm with exertion, SpO2 93% on RA seated. RN/NT notified pt up on BSC, pt A&O agreeable to notify staff once done/within 5 mins if possible for his safety. No dizziness sitting up on BSC. Patient will benefit from intensive inpatient follow-up therapy, >3 hours/day.    If plan is discharge home, recommend the following: Assistance with cooking/housework;Assist for transportation;A little help with walking and/or transfers;A little help with bathing/dressing/bathroom   Can travel by private vehicle        Equipment Recommendations  None recommended by PT    Recommendations for Other Services       Precautions / Restrictions Precautions Precautions: Fall Recall of Precautions/Restrictions: Intact Precaution/Restrictions Comments: fear of falls; not on O2 at baseline Restrictions Weight Bearing Restrictions Per Provider Order: No Other Position/Activity Restrictions: monitor O2     Mobility  Bed Mobility Overal bed mobility: Modified Independent Bed Mobility: Supine to Sit     Supine to sit: Supervision, HOB elevated, Used rails     General bed mobility comments: cues for use of rail/self-assist as pt reaching for therapist arms when getting to R EOB. PTA assist with  line mgmt for pt safety    Transfers Overall transfer level: Needs assistance Equipment used: Rolling walker (2 wheels) Transfers: Sit to/from Stand, Bed to chair/wheelchair/BSC Sit to Stand: Supervision   Step pivot transfers: Contact guard assist       General transfer comment: EOB>RW and RW>BSC, cues for safety    Ambulation/Gait               General Gait Details: pt defers due to c/o abdominal discomfort and fatigue and wanting to sit on toilet   Stairs             Wheelchair Mobility     Tilt Bed    Modified Rankin (Stroke Patients Only)       Balance Overall balance assessment: Needs assistance Sitting-balance support: Feet supported Sitting balance-Leahy Scale: Good     Standing balance support: Bilateral upper extremity supported, Single extremity supported, No upper extremity supported Standing balance-Leahy Scale: Poor Standing balance comment: Reliant on RW, Fair with RW, poor wtihout                            Communication Communication Communication: No apparent difficulties Factors Affecting Communication: Hearing impaired  Cognition Arousal: Alert Behavior During Therapy: WFL for tasks assessed/performed, Flat affect   PT - Cognitive impairments: No apparent impairments                         Following commands: Intact      Cueing Cueing Techniques: Verbal cues, Gestural cues  Exercises Other Exercises Other Exercises: pt defers today due to c/o fatigue and recent enema, wanting only  to sit on [facility]    General Comments General comments (skin integrity, edema, etc.): small amount of watery BM on bed pad, NT notified; pt up on BSC at end of session and agreeable to notify staff after ~5 mins trying to have BM; SpO2 93% on RA, HR to ~120 bpm with step pivot to [facility]      Pertinent Vitals/Pain Pain Assessment Pain Assessment: Faces Faces Pain Scale: Hurts little more Pain Location: stomach/abdominal  distension Pain Descriptors / Indicators: Discomfort, Guarding Pain Intervention(s): Limited activity within patient's tolerance, Monitored during session, Repositioned    Home Living                          Prior Function            PT Goals (current goals can now be found in the care plan section) Acute Rehab PT Goals Patient Stated Goal: get well, return home, maybe rehab before home PT Goal Formulation: With patient Time For Goal Achievement: 02/19/24 Progress towards PT goals: Progressing toward goals    Frequency    Min 3X/week      PT Plan      Co-evaluation              AM-PAC PT 6 Clicks Mobility   Outcome Measure  Help needed turning from your back to your side while in a flat bed without using bedrails?: A Little Help needed moving from lying on your back to sitting on the side of a flat bed without using bedrails?: A Little Help needed moving to and from a bed to a chair (including a wheelchair)?: A Little Help needed standing up from a chair using your arms (e.g., wheelchair or bedside chair)?: A Little Help needed to walk in hospital room?: A Lot (due to need for chair follow) Help needed climbing 3-5 steps with a railing? : Total 6 Click Score: 15    End of Session Equipment Utilized During Treatment: Other (comment) (pt defers gait belt) Activity Tolerance: Patient limited by fatigue;Other (comment) (pt requesting to sit on [facility], recent enema) Patient left: with call bell/phone within reach;Other (comment) (on BSC adjacent to bed, pt agreeable to notify staff for assist once done wtih BM) Nurse Communication: Mobility status;Other (comment) (pt on BSC; NT also messaged) PT Visit Diagnosis: Unsteadiness on feet (R26.81);Other abnormalities of gait and mobility (R26.89);Muscle weakness (generalized) (M62.81);Difficulty in walking, not elsewhere classified (R26.2)     Time: 1451-1501 PT Time Calculation (min) (ACUTE ONLY): 10  min  Charges:    $Therapeutic Activity: 8-22 mins PT General Charges $$ ACUTE PT VISIT: 1 Visit                     Sharisa Toves P., PTA Acute Rehabilitation Services Secure Chat Preferred 9a-5:30pm Office: [phone]    [name] [facility] 02/17/2024, 3:10 PM

## 2024-02-17 NOTE — Progress Notes (Addendum)
 Advanced Heart Failure Rounding Note  Cardiologist: Alm Clay, MD  HF Consulting Cardiologist: Dr. Zenaida  Chief Complaint: Acute on chronic HFrEF  Subjective:   9/9 PTCA LAD d/t stent thrombosis. A fib w/ RVR. CO-OX 44 %. Started on milrinone  0.25 mcg.  9/10:  EGD and flex sig 9/10.  EGD with 1 superficial esophageal ulcer, LA grade C reflux esophagitis with no bleeding. Flex sig: Red blood in the rectum, sigmoid colon, descending colon and transverse colon. Few diverticula found. No obvious source of bleeding.  9/18 Milrinone  stopped.   CT A/P w/o on 09/22 w/ dilated loops of small bowel and colon w/ air fluid levels in distal colon and rectum w/ obstruction or pneumatosis.  Now on neostigmine . Initially had good response but no BM overnight nor this morning. Abdomen much more distended today F/u abdominal X-ray pending.   Co-ox remains stable off milrinone  57%.   Wt up 4 lb. CVP 8   Remains in Afib 110s.   Feels tired and weak. Mild dyspnea.   Objective:   Weight Range: 83.1 kg Body mass index is 24.85 kg/m.   Vital Signs:   Temp:  [97.3 F (36.3 C)-98.3 F (36.8 C)] 97.3 F (36.3 C) (09/25 0723) Pulse Rate:  [95-111] 111 (09/25 1013) Resp:  [17-20] 20 (09/25 0723) BP: (101-118)/(59-88) 106/88 (09/25 0723) SpO2:  [91 %-99 %] 95 % (09/25 0723) Last BM Date : 02/16/24  Weight change: Filed Weights   02/12/24 0448 02/13/24 0456 02/14/24 0354  Weight: 80.5 kg 81.5 kg 83.1 kg    Intake/Output:   Intake/Output Summary (Last 24 hours) at 02/17/2024 1031 Last data filed at 02/17/2024 0757 Gross per 24 hour  Intake 646.29 ml  Output --  Net 646.29 ml     Physical Exam   Vitals:   02/17/24 0723 02/17/24 1013  BP: 106/88   Pulse: 99 (!) 111  Resp: 20   Temp: (!) 97.3 F (36.3 C)   SpO2: 95%    Vitals:   02/17/24 1013 02/17/24 1100  BP:  100/84  Pulse: (!) 111 (!) 103  Resp:  17  Temp:    SpO2:  98%   CVP 8  GENERAL: NAD Lungs- diminished  at the bases  CARDIAC:  JVP: 8 cm         Irregularly irregular rate and regular rhythm. Trace b/l LEE  ABDOMEN: Markedly distended, mildly tender. + active BS.  EXTREMITIES: Warm and well perfused.  NEUROLOGIC: No obvious FND   Telemetry   Afib low 100s, personally reviewed    Labs    CBC Recent Labs    02/16/24 0515 02/17/24 0510  WBC 2.9* 4.4  HGB 10.8* 11.2*  HCT 34.3* 34.9*  MCV 88.9 87.5  PLT 694* 698*   Basic Metabolic Panel Recent Labs    90/76/74 0505 02/16/24 0515 02/17/24 0510  NA 133* 135 134*  K 3.8 3.8 3.3*  CL 98 102 100  CO2 21* 21* 23  GLUCOSE 139* 110* 134*  BUN 37* 44* 41*  CREATININE 1.41* 1.53* 1.47*  CALCIUM  9.0 8.6* 8.7*  MG 2.2  --   --    Liver Function Tests Recent Labs    02/16/24 0515 02/17/24 0510  AST 14* 14*  ALT 14 15  ALKPHOS 68 73  BILITOT 0.6 0.4  PROT 5.8* 6.2*  ALBUMIN 2.7* 2.9*    Medications:   Scheduled Medications:  apixaban   5 mg Oral BID   Chlorhexidine  Gluconate Cloth  6 each Topical Q0600   digoxin   0.0625 mg Oral Daily   famotidine   20 mg Oral Daily   feeding supplement  237 mL Oral TID BM   Gerhardt's butt cream   Topical BID   magic mouthwash  5 mL Oral QID   melatonin  5 mg Oral QHS   multivitamin with minerals  1 tablet Oral Daily   neostigmine   0.25 mg Subcutaneous Q6H   nystatin    Topical BID   pantoprazole   40 mg Oral BID   potassium chloride   40 mEq Oral Q4H   rifaximin   550 mg Oral TID   rosuvastatin   20 mg Oral Daily   sodium chloride  flush  10-40 mL Intracatheter Q12H   sodium chloride  flush  3 mL Intravenous Q12H   spironolactone   25 mg Oral Daily   ticagrelor   90 mg Oral BID   torsemide   20 mg Oral Daily    Infusions:  amiodarone  30 mg/hr (02/17/24 0525)     PRN Medications: acetaminophen , ALPRAZolam , atropine , diphenhydrAMINE , guaiFENesin -dextromethorphan , ipratropium-albuterol , metoCLOPramide  (REGLAN ) injection, mouth rinse, polyethylene glycol, simethicone , sodium  chloride flush, sodium chloride  flush  Patient Profile  Mr Chase Combs is a 78 year old with a history of ICM, chronic biventricular HFrEF, CAD s/p PCI to LAD in 08/25.      Readmitted 01/22/24 with abdominal pain/nausea/vomiting. Hospital course complicated by ischemic colitis w/ septic shock/GI bleed, stent thrombosis involving recent LAD stent and low-output HF.   Assessment/Plan  CAD, recent STEMI d/t stent thrombosis - 01/17/24- PCI/DES LAD  - 02/01/24 Cangrelor  stopped given Plavix  load and ST elevation. Acute stent thrombosis involving LAD stent treated with PTCA. - Plavix  Genetic Testing collected, pending (sent 9/10, checked with lab 9/22 and still in process) - stable w/o CP  - Continue ticagrelor  90mg  BID.  - Off aspirin  with GI bleed.  - Continue high-intensity statin.   New A fib RVR - Continue amiodarone  30/hr. Rate 110s today.  - Given his low output HF, would avoid giving additional IV beta blocker. - Hold off on DCCV until GI issues have resolved - Continue Eliquis  5 BID. Hgb stable. - Continue digoxin  0.0625   A/C HFrEF, ICM   -01/13/24 Echo EF  20-25% RV moderately reduced.   -02/01/24 Echo LVEF 20-25% RV moderately reduced.    - CO-OX 53% with Fick CI 2.2 on 0.125 milrinone . Milrinone  stopped 9/18.  - CO-OX 57% off milrinone . - CVP 8-9. Continue torsemide  20 mg daily  - Off SGLT2i with fungal rash - Continue spiro 25 mg daily - Continue digoxin  0.0625 mcg daily. Dig level < 0.6. - No BP room to further titrate GDMT - Renal function stable.    Anemia in the setting of GI Bleed -Transfused 9/8 with appropriate rise.  - 9/10 he had large bloody BM. Unfortunately given 600 mg Ibuprofen .  - Protonix  and Pepcid  per GI - GI following.  - S/p EGD and flex sig.  EGD with 1 superficial esophageal ulcer, LA grade C reflux esophagitis with no bleeding. Flex sig: Red blood in the rectum, sigmoid colon, descending colon and transverse colon. Few diverticula found. No obvious  source of bleeding, possibly diverticular per GI. - Hgb stable.   Intestinal pneumatosis w/ concern for ischemic colitis Ileus Psuedoobstruction - Gen surgery recommended conservative management - Treated with antibiotics for suspected septic shock. - Transverse colon measures 9.3 cm on Xray last week.  - CT A/P w/o on 09/22 w/ dilated loops of small bowel and colon w/  air fluid levels in distal colon and rectum w/ obstruction or pneumatosis - Getting neostigmine  per GI. Initially had good response but no BM overnight nor this morning. Abdomen much more distended today F/u abdominal X-ray pending.  - Plan per GI today is tap water  enema. May need endoscopic decompression   Hypokalemia - K 3.3 - give K supp  - continue spironolactone .     Code status: DNR      Length of Stay: [address] PA-C  02/17/2024, 10:31 AM  Advanced Heart Failure Team Pager [phone] (M-F; 7a - 5p)  Please contact [facility] for night-coverage after hours (5p -7a ) and weekends on [email or web address]  Patient seen with PA, I formulated the plan and agree with the above note.   Increase abdominal distention again, he was made NPO again with sips with meds.  Has had tap water  enema.  CVP 10-11 on my read, co-ox 57%.  HR around 110 in atrial fibrillation.   Feels weaker.   General: NAD Neck: JVP 10 cm, no thyromegaly or thyroid  nodule.  Lungs: Clear to auscultation bilaterally with normal respiratory effort. CV: Nondisplaced PMI.  Heart mildly tachy, irregular S1/S2, no S3/S4, no murmur.  No peripheral edema.   Abdomen: Soft, nontender, no hepatosplenomegaly, severe distention.  Skin: Intact without lesions or rashes.  Neurologic: Alert and oriented x 3.  Psych: Normal affect. Extremities: No clubbing or cyanosis.  HEENT: Normal.   Patient remains in atrial fibrillation. Continue amiodarone  gtt and Eliquis .  Will need eventual DCCV if he does not convert spontaneously. Would like to see  improvement of his abdominal process before cardioverting him. He may need a colonoscopy for decompression tomorrow so will not plan tomorrow.    CVP 10-11 today with co-ox 57%.  Creatinine stable at 1.47.    - Continue digoxin  - Continue spironolactone  25 mg daily.   - He had torsemide  20 mg x 1 this morning, with elevated CVP will give Lasix  80 mg IV this afternoon and increase daily torsemide  to 40 mg daily.  Replace K.     S/p stent thrombosis with PTCA.  He is on ticagrelor , no ASA given recent GI bleeding and use of Eliquis .  He is on statin.  Waiting for clopidogrel  genetic testing to see if we can transition to clopidogrel .    Colonic ileus appears worse today with more distention.  He will be getting [place] neostigmine  today.  Has had tapwater enema.  May need colonic decompression tomorrow.  GI following.   Ezra Shuck 02/17/2024 1:46 PM

## 2024-02-17 NOTE — Plan of Care (Signed)
  Problem: Education: Goal: Knowledge of General Education information will improve Description: Including pain rating scale, medication(s)/side effects and non-pharmacologic comfort measures Outcome: Progressing   Problem: Health Behavior/Discharge Planning: Goal: Ability to manage health-related needs will improve Outcome: Progressing   Problem: Clinical Measurements: Goal: Ability to maintain clinical measurements within normal limits will improve Outcome: Progressing   Problem: Nutrition: Goal: Adequate nutrition will be maintained Outcome: Progressing   Problem: Pain Managment: Goal: General experience of comfort will improve and/or be controlled Outcome: Progressing

## 2024-02-17 NOTE — Progress Notes (Signed)
 PROGRESS NOTE    Chase Combs  FMW:[id number] DOB: 1945/06/14 DOA: 01/22/2024 PCP: Wonda Worth SQUIBB, PA  Subjective: Pt seen and examined. Only 1 liquid BM last night. Feels that neostigmine  no longer working. Abd is still distended. Pt states he cannot eat due to lack of taste in food and abd distension. Still in rapid afib with HR in the 130s.   Hospital Course: CC: weakness, emesis HPI: 78 year old man w/ hx of ischemic cardiomyopathy and very recent LAD stent placement for NSTEMI discharged 8/27 (no d/c summary so unclear) with progressive nausea vomiting with CT images concerning for ischemic colitis.   Feeling weak and unwell since discharge 3 days ago.  Progressive nausea and vomiting not keeping down his antiplatelet medicines.  CT images reveal intestinal air in the wall of the intestine as well as concerning findings of ileus.  His lactate was elevated.  He was volume resuscitated.  Remained hypotensive.  Placed on broad-spectrum antibiotics he received vancomycin  Zosyn  doxycycline and Flagyl .  Labs notable for acute renal failure.  Anion gap metabolic acidosis.  Leukopenia, lymphopenia.  Significant Events: Admitted 01/22/2024 to ICU by PCCM due to intestinal pneumatosis concerning for ischemic colitis 01-24-2024 to 01-27-2024 eneral surgery recommended conservative management.   Concern patient not able to absorb antiplatelet therapy, he was placed on IV cangrelor .  Patient continue to be hypotensive despite IV fluids and was placed on norepinephrine  infusion, consistent with septic shock due to abdominal source/ ischemic colitis.  during hospitalization required PRBC transfusion due to blood loss anemia.   There was concerns of in-stent thrombosis from recent PCI therefore antiplatelets were switched from Cangrelor  to Plavix  and started on milrinone  drip due to cardiogenic shock.   Developed atrial fibrillation with rapid ventricular response, placed on IV amiodarone .  Transition to  Aggrastat for anticoagulation.  09/05 GI consulted, clinical picture consistent with colonic ileus, Ogilvie syndrome.  09/08 PRBC transfusion x1, transitioned to clopidogrel .  09/09 chest pain with new antero lateral ST elevations. Underwent urgent cardiac catheterization, LAD with ostially occluded stent with acute stent thrombosis (Culprit vessel). Successful percutaneous coronary intervention ostial proximal LAD, PTCA with balloon dilatation.  Significant volume overloaded with low SV02 and CVP very high, placed on IV furosemide  and milrinone .  09/10 completed antibiotic course.  09/10 EGD with esophageal ulcer with no bleeding and no stigmata of recent bleeding.  LA grade C reflux esophagitis with no bleeding.  Duodenal erosions without bleeding.  Flexible sigmoidoscopy positive blood in the rectum, in the sigmoid colon, in the descending colon and in the transverse colon. No obvious source of bleed.  Diverticulosis in the sigmoid colon and in the descending colon. No active bleeding.  A single superficial ulcer in the rectum. No active bleeding.   09/12 transition to ticagrelor .  Slowly weaned off TPN, diet as tolerated.   09/19 continue colonic pseudo obstruction, imaging with progression of colonic distention. Started on rifaximin .  Pending cardioversion until bowel function improves.  09/21 pending direct current cardioversion.  09/22 CT abdomen and pelvis dilated loops of small bowel and colon with air fluid levels in the distal colon and rectum. No obstruction. Appearance consistent with ileus or functional bowel disturbance.  09/23 trial of neostigmine    Admission Labs: Na 132, K 4.5, CO2 of 31, BUN 64, scr 2.55, glu 197 T prot 6.2, alb 3.0, AST 29, ALT 31, alk pho 64, t. Bili 1.4 WBC 2.4, HgB 13.9, plt 489 BNP 220 Procalcitonin 1.23  Admission Imaging Studies: CXR  Low lung volumes with bibasilar linear and patchy opacities. 2. Small stable bilateral pleural effusion. 3. Elevated  right hemidiaphragm. CT chest/abd/pelvis Significantly improved multifocal, bilateral pneumonia since 01/14/24. Persistent airspace and ground glass opacities in the right lower lobe, left lower lobe, and posterior left upper lobe/lingula, likely residual pneumonia or aspiration. 2. Gas along the wall of the ascending colon and cecum, suspicious for pneumatosis. Findings called to PA [place] at 3:19 pm. 3. Fluid-filled colon suggests a diarrheal state. Diffuse colonic and small bowel distention likely represent Ileus. 4. Increased small volume pelvic and new right-sided pericolonic trace fluid, likely secondary to the colonic proces. 5. Incidental findings, including: Pulmonary artery enlargement suggests pulmonary arterial hypertension. Aortic and coronary artery atherosclerosis. Right nephrolithiasis. Suspicion of minimal chronic calcific pancreatitis.  Significant Labs:   Significant Imaging Studies: 01-30-2024 CTA GI bleed Severe narrowing is noted at origin of celiac artery with poststenotic dilatation. No definite evidence of contrast extravasation to suggest gastrointestinal bleeding. Mild wall and fold thickening of ascending colon is noted suggesting possible infectious or inflammatory colitis 02-02-2024 echo LVEF 20-25%. eft ventricle has severely decreased function  02-14-2024 CT abd/pelvis Dilated loops of small bowel and colon with air-fluid levels in the distal colon and rectum indicating diarrheal process. No lead point for obstruction is identified. No pneumatosis or free air. Presumably the appearance is due to ileus or functional bowel disturbance. 2. 0.3 cm right kidney lower pole nonobstructive stone. 3. Multilevel lumbar degenerative facet arthropathy with multilevel foraminal impingement. 4. Umbilical hernia contains adipose tissue. 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 5. High density in the gallbladder probably from sludge. 6. Mild bibasilar atelectasis. 7. Aortic  Atherosclerosis (ICD10-I70.0). Coronary atherosclerosis. Prominent cardiomegaly.  Antibiotic Therapy: Anti-infectives (From admission, onward)    Start     Dose/Rate Route Frequency Ordered Stop   02/11/24 1000  rifaximin  (XIFAXAN ) tablet 550 mg        550 mg Oral 3 times daily 02/11/24 0908     02/01/24 1645  cefTRIAXone  (ROCEPHIN ) 2 g in sodium chloride  0.9 % 100 mL IVPB  Status:  Discontinued        2 g 200 mL/hr over 30 Minutes Intravenous Every 24 hours 02/01/24 1553 02/03/24 1029   01/29/24 2200  metroNIDAZOLE  (FLAGYL ) tablet 500 mg  Status:  Discontinued        500 mg Oral Every 12 hours 01/29/24 1627 02/03/24 1029   01/22/24 2200  piperacillin -tazobactam (ZOSYN ) IVPB 3.375 g        3.375 g 12.5 mL/hr over 240 Minutes Intravenous Every 8 hours 01/22/24 1602 01/28/24 2359   01/22/24 1800  metroNIDAZOLE  (FLAGYL ) IVPB 500 mg  Status:  Discontinued        500 mg 100 mL/hr over 60 Minutes Intravenous Every 8 hours 01/22/24 1653 01/23/24 0857   01/22/24 1545  metroNIDAZOLE  (FLAGYL ) IVPB 500 mg  Status:  Discontinued        500 mg 100 mL/hr over 60 Minutes Intravenous  Once 01/22/24 1533 01/22/24 1634   01/22/24 1330  doxycycline (VIBRAMYCIN) 100 mg in sodium chloride  0.9 % 250 mL IVPB  Status:  Discontinued        100 mg 125 mL/hr over 120 Minutes Intravenous  Once 01/22/24 1320 01/22/24 1649   01/22/24 1315  azithromycin (ZITHROMAX) 500 mg in sodium chloride  0.9 % 250 mL IVPB  Status:  Discontinued        500 mg 250 mL/hr over 60 Minutes Intravenous  Once 01/22/24 1309 01/22/24 1319  01/22/24 1315  ceFEPIme  (MAXIPIME ) 2 g in sodium chloride  0.9 % 100 mL IVPB        2 g 200 mL/hr over 30 Minutes Intravenous  Once 01/22/24 1309 01/22/24 1515   01/22/24 1315  vancomycin  (VANCOREADY) IVPB 1750 mg/350 mL  Status:  Discontinued        1,750 mg 175 mL/hr over 120 Minutes Intravenous  Once 01/22/24 1311 01/22/24 1756       Procedures: 02-01-2024 LHC LM: Distal 45% stenosis,  LAD:  Ostially occluded stent with acute stent thrombosis (Culprit vessel),  [place]: 50% in-stent restenosis,  Lcx: Mild diffuse disease,  RCA: Not engaged today. LVEDP 27 mmHg.  Successful percutaneous coronary intervention ostial prox LAD.  PTCA with serial balloon dilatation, up to 3.0  X 12 mm [place] balloon up to 20 atm  Consultants: PCCM Cardiology General surgery    Assessment and Plan: * Pneumatosis intestinalis of large intestine-resolved as of 02/16/2024 01-22-2024 to 01-29-2024.  admitted to ICU by PCCM. General surgery consulted. CT abd showed pneumatosis. Surgery did not feel that pt was a operative candidate. IV abx started. Pt developed gastric distension and ileus.  Due to N/V and ileus, NG tube was placed for gastric decompression. By 01-26-2024, abd distension improved, NG tube removed and clear liquid diet started.   Atrial fibrillation with RVR (HCC) 02-04-2024 pt started on digoxin  and IV amiodarone  for rate control as pt's blood pressure too low to start any other rate controlled meds due to cardiogenic shock.  02-05-2024 through 02-15-2024 afib rate remains uncontrolled. Due to ongoing colonic ileus, cardiology does not want to cardiovert.  02-16-2024 remains on IV amiodarone  gtts. Rate still in the mid 100s. SBP borderline low at 90-100s  02/17/24 pt remains on IV amiodarone  and eliquis . HR still uncontrolled. Cardiology managing his rapid afib.   Colonic pseudoobstruction 01-27-2025 GI consulted due to colonic ileus that was not responding to conservative measures. Pt has persistent colonic distension.  GI recommended to continue Flexiseal and to treat empiric treatment with flagyl  for presumed SIBO(small intestine bacterial overgrowth).  02-01-2024 pt develops rectal bleeding and drops his HgB from 9.2 g/dl to 7.3 g/dl.  02-02-2024 pt taken for flex sigmoidoscopy and EGD that shows bright red blood in the colon from the rectum to the transverse colon underlying mucosa appeared  normal few diverticuli noted in the left and sigmoid no active bleeding, single nonbleeding superficial ulcer in the rectum.  EGD showed superficial esophageal ulcer with no bleeding, grade C esophagitis with no bleeding and a few erosions in the first portion of the duodenum  02-03-2024 to 02-10-2024 persistent abd distension despite liquid bowel movements.    02-11-2024 Xifaxan  started for additional SIBO treatment due to persistent abdominal distension.   02-15-2024 after 72 hours of no improvement with treatment with Xifaxan  for SIBO, pt started on SQ neostigmine  with IV robinul  to help with colonic decompression.  02-16-2024 GI continuing with SQ neostigmine  q6h.  02-17-2024 GI managing colonic ileus. Abd XR ordered. Pt may need endoscopic decompression. GI has made pt NPO.  AKI (acute kidney injury) 01-22-2024 through 01-28-2024. Admitted with AKI with Scr of 2.55. with IVF and treatment of his ileus/intestinal pneumatosis, his scr return to 1.07 by 01-28-2024  02-08-2024 through 02-11-2024 he develops another AKI during his cardiogenic shock. By 02-10-2024, scr back to 1.06  02-13-2024 scr back on the rise again after uncontrolled afib and starting of aldcatone/demadex .  02/17/24 Scr stable at 1.4 - 1.5 on demadex  and aldactone .  Small intestinal bacterial overgrowth (SIBO) 01-27-2025 GI consulted due to colonic ileus that was not responding to conservative measures. Pt has persistent colonic distension.  GI recommended to continue Flexiseal and to treat empiric treatment with flagyl  for presumed SIBO(small intestine bacterial overgrowth).   02-03-2024 to 02-10-2024 persistent abd distension despite liquid bowel movements.     02-11-2024 Xifaxan  started for additional SIBO treatment due to persistent abdominal distension.    02-15-2024 after 72 hours of no improvement with treatment with Xifaxan  for SIBO, pt started on IV neostigmine  with IV robinul  to help with colonic decompression.    02-16-2024 GI continuing with SQ neostigmine  q6h.  02/17/24 remains on Xifaxan  TID. GI treating him for 14 days. End date 02-24-2024. During prior admission, flagyl  seemed to work but no longer.   Acute on chronic systolic CHF (congestive heart failure) (HCC) 02-02-2024 pt develops cardiogenic shock after STEMI requiring PTCA. Pt placed on milrinone  for cardiovascular support.  02-04-2024 through 02-09-2024 pt started on IV lasix  gtts for diuresis while he has cardiogenic shock to help remove intravascular volume  02-14-2024 po demadex  and aldactone  started.  02/17/24 stable on po demadex  and aldactone . Scr 1.4-1.5 over the last 4-5 days.   Cardiogenic shock (HCC)-resolved as of 02/16/2024 02-01-2024 after his STEMI from in-stent restenosis requiring PCTA on 02-01-2024. Pt develops cardiogenic shock requiring IV milrinone  and transfer to CICU.  02-02-2024 to 02-09-2024 echo shows LVEF dropped to 20-25%. Pt kept on milrinone  until 02-09-2024 when it was eventually weaned off.  Anemia due to acute blood loss-resolved as of 02/16/2024 GI bleed.   SP 4 units PRBC transfusion.   EGD with flexible sigmoidoscopy with red blood in the colon.  Grade 1 esophagitis.  Continue pantoprazole .  Follow up hgb is 11.3   Acute blood loss anemia-resolved as of 02/16/2024 02-01-2024 pt develops rectal bleeding and drops his HgB from 9.2 g/dl to 7.3 g/dl.  02-02-2024 pt transfused 2 units PRBC.  pt taken for flex sigmoidoscopy and EGD that shows bright red blood in the colon from the rectum to the transverse colon underlying mucosa appeared normal few diverticuli noted in the left and sigmoid no active bleeding, single nonbleeding superficial ulcer in the rectum.  EGD showed superficial esophageal ulcer with no bleeding, grade C esophagitis with no bleeding and a few erosions in the first portion of the duodenum  Gastrointestinal hemorrhage-resolved as of 02/16/2024 02-01-2024 pt develops rectal bleeding and  drops his HgB from 9.2 g/dl to 7.3 g/dl.   02-02-2024 pt taken for flex sigmoidoscopy and EGD that shows bright red blood in the colon from the rectum to the transverse colon underlying mucosa appeared normal few diverticuli noted in the left and sigmoid no active bleeding, single nonbleeding superficial ulcer in the rectum.  EGD showed superficial esophageal ulcer with no bleeding, grade C esophagitis with no bleeding and a few erosions in the first portion of the duodenum  Type 2 diabetes mellitus with hyperlipidemia (HCC) 02-16-2024 CBG stable without SSI  02/17/24 CBG stable   Pressure injury of skin Wound 02/01/24 1600 Pressure Injury Buttocks Stage 2 -  Partial thickness loss of dermis presenting as a shallow open injury with a red, pink wound bed without slough. (Active)    Malnutrition of moderate degree Nutrition Status: Nutrition Problem: Moderate Malnutrition Etiology: acute illness Signs/Symptoms: energy intake < 75% for > 7 days, moderate muscle depletion, mild muscle depletion Interventions: Boost Breeze, MVI, Refer to RD note for recommendations    Essential hypertension 02-17-2024 unable  to start HTN meds due to low BP  Coronary artery disease involving native coronary artery of native heart without angina pectoris 09-09--2025 pt has in-stent thrombosis causing STEMI. Taken to cath lab for [facility] to PTCA.  02-16-2024 pt on Brilinta . ASA on hold due to GI bleed on 02-02-2024.  Restart crestor  20 mg daily.  02/17/24 remains on Brilinta  and Eliquis . No ASA due to GI bleeding on 02-02-2024.   ST elevation myocardial infarction involving left main coronary artery (HCC)-resolved as of 02/16/2024 02-01-2024 At around 1:15 PM, patient started having right shoulder pain and started feeling diaphoretic. EKG showed new anterolateral ST elevation. Cardiology felt that pt likely had occluded his recently placed LAD stent with several days of likely suboptimal platelet inhibition. Pt  taken emergently to cath lab where LHC showed LAD: Ostially occluded stent with acute stent thrombosis. Pt subsequently develops cardiogenic shock and requires IV milrinone  for 7 days.   DVT prophylaxis: Place and maintain sequential compression device Start: 02/04/24 1132 Place TED hose Start: 02/04/24 0941 SCD's Start: 02/01/24 1555 apixaban  (ELIQUIS ) tablet 5 mg     Code Status: Do not attempt resuscitation (DNR) PRE-ARREST INTERVENTIONS DESIRED Family Communication: no family at bedside Disposition Plan: unknown Reason for continuing need for hospitalization: remains on SQ neostigmine , IV amiodarone . May need endoscopic decompression of colon today.  Objective: Vitals:   02/16/24 1945 02/16/24 2300 02/17/24 0400 02/17/24 0723  BP: 108/79 118/83 103/72 106/88  Pulse: 100 95 98 99  Resp: 18 17 20 20   Temp: 97.6 F (36.4 C) (!) 97.5 F (36.4 C) (!) 97.4 F (36.3 C) (!) 97.3 F (36.3 C)  TempSrc: Axillary Axillary Axillary Axillary  SpO2: 91% 95% 97% 95%  Weight:      Height:        Intake/Output Summary (Last 24 hours) at 02/17/2024 1011 Last data filed at 02/17/2024 0757 Gross per 24 hour  Intake 646.29 ml  Output --  Net 646.29 ml   Filed Weights   02/12/24 0448 02/13/24 0456 02/14/24 0354  Weight: 80.5 kg 81.5 kg 83.1 kg    Examination:  Physical Exam Vitals and nursing note reviewed.  Constitutional:      Comments: Chronically ill appearing  HENT:     Head: Normocephalic and atraumatic.  Eyes:     General: No scleral icterus. Cardiovascular:     Rate and Rhythm: Tachycardia present. Rhythm irregular.  Pulmonary:     Effort: Pulmonary effort is normal. No respiratory distress.     Breath sounds: Normal breath sounds.  Abdominal:     General: Abdomen is protuberant. Bowel sounds are decreased. There is distension.     Comments: High pitched BS. Hypoactive  Umbilical hernia  Musculoskeletal:     Comments: Trace LE edema  Skin:    General: Skin is warm  and dry.     Capillary Refill: Capillary refill takes less than 2 seconds.  Neurological:     Mental Status: He is alert and oriented to person, place, and time.     Data Reviewed: I have personally reviewed following labs and imaging studies  CBC: Recent Labs  Lab 02/13/24 0445 02/14/24 0420 02/15/24 0505 02/16/24 0515 02/17/24 0510  WBC 5.5 6.2 3.3* 2.9* 4.4  HGB 10.5* 11.3* 10.7* 10.8* 11.2*  HCT 33.2* 35.7* 33.9* 34.3* 34.9*  MCV 89.2 89.3 89.0 88.9 87.5  PLT 649* 723* 706* 694* 698*   Basic Metabolic Panel: Recent Labs  Lab 02/11/24 0500 02/12/24 0500 02/13/24 0445 02/14/24 0420 02/15/24  0505 02/16/24 0515 02/17/24 0510  NA 135 137 136 135 133* 135 134*  K 3.8 3.6 4.1 4.2 3.8 3.8 3.3*  CL 99 103 104 103 98 102 100  CO2 22 20* 22 22 21* 21* 23  GLUCOSE 143* 135* 141* 144* 139* 110* 134*  BUN 31* 26* 28* 30* 37* 44* 41*  CREATININE 1.06 1.21 1.43* 1.45* 1.41* 1.53* 1.47*  CALCIUM  8.6* 8.6* 8.8* 9.2 9.0 8.6* 8.7*  MG 2.4 2.4 2.2 2.4 2.2  --   --   PHOS 3.2  --   --   --   --   --   --    GFR: Estimated Creatinine Clearance: 46.9 mL/min (A) (by C-G formula based on SCr of 1.47 mg/dL (H)). Liver Function Tests: Recent Labs  Lab 02/13/24 0445 02/14/24 0420 02/15/24 0505 02/16/24 0515 02/17/24 0510  AST 16 17 12* 14* 14*  ALT 20 20 17 14 15   ALKPHOS 70 79 69 68 73  BILITOT 0.7 0.4 0.6 0.6 0.4  PROT 6.2* 6.6 6.0* 5.8* 6.2*  ALBUMIN 2.9* 3.1* 2.8* 2.7* 2.9*   BNP (last 3 results) Recent Labs    01/11/24 0539 01/13/24 0537 01/22/24 1243  BNP 842.8* 1,747.3* 220.4*   CBG: Recent Labs  Lab 02/15/24 1107 02/15/24 1559 02/15/24 2100 02/16/24 0601 02/16/24 2058  GLUCAP 146* 116* 131* 116* 166*   Sepsis Labs: Recent Labs  Lab 02/10/24 1015  LATICACIDVEN 1.2   Scheduled Meds:  apixaban   5 mg Oral BID   Chlorhexidine  Gluconate Cloth  6 each Topical Q0600   digoxin   0.0625 mg Oral Daily   famotidine   20 mg Oral Daily   feeding supplement   237 mL Oral TID BM   Gerhardt's butt cream   Topical BID   magic mouthwash  5 mL Oral QID   melatonin  5 mg Oral QHS   multivitamin with minerals  1 tablet Oral Daily   neostigmine   0.25 mg Subcutaneous Q6H   nystatin    Topical BID   pantoprazole   40 mg Oral BID   potassium chloride   40 mEq Oral Q4H   rifaximin   550 mg Oral TID   rosuvastatin   20 mg Oral Daily   sodium chloride  flush  10-40 mL Intracatheter Q12H   sodium chloride  flush  3 mL Intravenous Q12H   spironolactone   25 mg Oral Daily   ticagrelor   90 mg Oral BID   torsemide   20 mg Oral Daily   Continuous Infusions:  amiodarone  30 mg/hr (02/17/24 0525)     LOS: 26 days   Time spent: 55 minutes  Camellia Door, DO  [facility]  02/17/2024, 10:11 AM

## 2024-02-17 NOTE — Progress Notes (Signed)
 RN administered Tap water  enema. Patient positioned on right side in with knee to abd. Patient able to tolerate approximately 400 cc of enema. Patient had approximately 250 of type 7 clay colored bowel movement.

## 2024-02-18 ENCOUNTER — Inpatient Hospital Stay (HOSPITAL_COMMUNITY): Admitting: Registered Nurse

## 2024-02-18 ENCOUNTER — Inpatient Hospital Stay (HOSPITAL_COMMUNITY)

## 2024-02-18 ENCOUNTER — Encounter (HOSPITAL_COMMUNITY)
Admission: EM | Disposition: A | Discharge status: Home Health | Payer: Self-pay | Source: Home / Self Care | Attending: Internal Medicine

## 2024-02-18 DIAGNOSIS — R1084 Generalized abdominal pain: Secondary | ICD-10-CM | POA: Insufficient documentation

## 2024-02-18 DIAGNOSIS — K5939 Other megacolon: Secondary | ICD-10-CM

## 2024-02-18 DIAGNOSIS — E876 Hypokalemia: Secondary | ICD-10-CM | POA: Diagnosis not present

## 2024-02-18 DIAGNOSIS — K6389 Other specified diseases of intestine: Secondary | ICD-10-CM | POA: Diagnosis not present

## 2024-02-18 DIAGNOSIS — I10 Essential (primary) hypertension: Secondary | ICD-10-CM

## 2024-02-18 DIAGNOSIS — I5023 Acute on chronic systolic (congestive) heart failure: Secondary | ICD-10-CM | POA: Diagnosis not present

## 2024-02-18 DIAGNOSIS — K5981 Ogilvie syndrome: Secondary | ICD-10-CM | POA: Diagnosis not present

## 2024-02-18 DIAGNOSIS — K5730 Diverticulosis of large intestine without perforation or abscess without bleeding: Secondary | ICD-10-CM

## 2024-02-18 DIAGNOSIS — J9601 Acute respiratory failure with hypoxia: Secondary | ICD-10-CM

## 2024-02-18 DIAGNOSIS — I4891 Unspecified atrial fibrillation: Secondary | ICD-10-CM | POA: Diagnosis not present

## 2024-02-18 DIAGNOSIS — R935 Abnormal findings on diagnostic imaging of other abdominal regions, including retroperitoneum: Secondary | ICD-10-CM | POA: Insufficient documentation

## 2024-02-18 DIAGNOSIS — E46 Unspecified protein-calorie malnutrition: Secondary | ICD-10-CM

## 2024-02-18 DIAGNOSIS — I2510 Atherosclerotic heart disease of native coronary artery without angina pectoris: Secondary | ICD-10-CM

## 2024-02-18 DIAGNOSIS — N179 Acute kidney failure, unspecified: Secondary | ICD-10-CM | POA: Diagnosis not present

## 2024-02-18 HISTORY — PX: COLONOSCOPY: SHX5424

## 2024-02-18 HISTORY — PX: BOWEL DECOMPRESSION: SHX5532

## 2024-02-18 LAB — CBC
HCT: 34.2 % — ABNORMAL LOW (ref 39.0–52.0)
Hemoglobin: 10.9 g/dL — ABNORMAL LOW (ref 13.0–17.0)
MCH: 28 pg (ref 26.0–34.0)
MCHC: 31.9 g/dL (ref 30.0–36.0)
MCV: 87.9 fL (ref 80.0–100.0)
Platelets: 649 K/uL — ABNORMAL HIGH (ref 150–400)
RBC: 3.89 MIL/uL — ABNORMAL LOW (ref 4.22–5.81)
RDW: 20.3 % — ABNORMAL HIGH (ref 11.5–15.5)
WBC: 4.3 K/uL (ref 4.0–10.5)
nRBC: 0 % (ref 0.0–0.2)

## 2024-02-18 LAB — COOXEMETRY PANEL
Carboxyhemoglobin: 1.3 % (ref 0.5–1.5)
Methemoglobin: <0.7 % (ref 0.0–1.5)
O2 Saturation: 54.9 %
Total hemoglobin: 11.5 g/dL — ABNORMAL LOW (ref 12.0–16.0)

## 2024-02-18 LAB — COMPREHENSIVE METABOLIC PANEL WITH GFR
ALT: 15 U/L (ref 0–44)
AST: 13 U/L — ABNORMAL LOW (ref 15–41)
Albumin: 2.7 g/dL — ABNORMAL LOW (ref 3.5–5.0)
Alkaline Phosphatase: 68 U/L (ref 38–126)
Anion gap: 15 (ref 5–15)
BUN: 39 mg/dL — ABNORMAL HIGH (ref 8–23)
CO2: 21 mmol/L — ABNORMAL LOW (ref 22–32)
Calcium: 8.1 mg/dL — ABNORMAL LOW (ref 8.9–10.3)
Chloride: 97 mmol/L — ABNORMAL LOW (ref 98–111)
Creatinine, Ser: 1.4 mg/dL — ABNORMAL HIGH (ref 0.61–1.24)
GFR, Estimated: 52 mL/min — ABNORMAL LOW (ref >60)
Glucose, Bld: 228 mg/dL — ABNORMAL HIGH (ref 70–99)
Potassium: 3.4 mmol/L — ABNORMAL LOW (ref 3.5–5.1)
Sodium: 133 mmol/L — ABNORMAL LOW (ref 135–145)
Total Bilirubin: 0.8 mg/dL (ref 0.0–1.2)
Total Protein: 5.8 g/dL — ABNORMAL LOW (ref 6.5–8.1)

## 2024-02-18 LAB — GLUCOSE, CAPILLARY: Glucose-Capillary: 148 mg/dL — ABNORMAL HIGH (ref 70–99)

## 2024-02-18 LAB — MAGNESIUM: Magnesium: 2.1 mg/dL (ref 1.7–2.4)

## 2024-02-18 SURGERY — COLONOSCOPY
Anesthesia: Monitor Anesthesia Care

## 2024-02-18 MED ORDER — POTASSIUM CHLORIDE 20 MEQ PO PACK
40.0000 meq | PACK | Freq: Once | ORAL | Status: AC
  Administered 2024-02-18: 40 meq via ORAL
  Filled 2024-02-18: qty 2

## 2024-02-18 MED ORDER — POTASSIUM CHLORIDE 10 MEQ/100ML IV SOLN
10.0000 meq | INTRAVENOUS | Status: AC
  Administered 2024-02-18 (×2): 10 meq via INTRAVENOUS
  Filled 2024-02-18 (×3): qty 100

## 2024-02-18 MED ORDER — FUROSEMIDE 10 MG/ML IJ SOLN
80.0000 mg | Freq: Once | INTRAMUSCULAR | Status: AC
  Administered 2024-02-18: 80 mg via INTRAVENOUS
  Filled 2024-02-18: qty 8

## 2024-02-18 MED ORDER — SODIUM CHLORIDE 0.9 % IV SOLN: INTRAVENOUS | Status: DC | PRN

## 2024-02-18 MED ORDER — BOOST / RESOURCE BREEZE PO LIQD CUSTOM
1.0000 | Freq: Three times a day (TID) | ORAL | Status: DC
  Administered 2024-02-18 – 2024-02-21 (×3): 1 via ORAL

## 2024-02-18 MED ORDER — POTASSIUM CHLORIDE 10 MEQ/100ML IV SOLN
10.0000 meq | INTRAVENOUS | Status: AC
  Administered 2024-02-18 (×2): 10 meq via INTRAVENOUS
  Filled 2024-02-18: qty 100

## 2024-02-18 MED ORDER — PROPOFOL 500 MG/50ML IV EMUL
INTRAVENOUS | Status: DC | PRN
  Administered 2024-02-18: 75 ug/kg/min via INTRAVENOUS

## 2024-02-18 MED ORDER — VASOPRESSIN 20 UNIT/ML IV SOLN
INTRAVENOUS | Status: DC | PRN
  Administered 2024-02-18: 1.5 [IU] via INTRAVENOUS
  Administered 2024-02-18 (×3): 1 [IU] via INTRAVENOUS

## 2024-02-18 MED ORDER — PHENYLEPHRINE HCL-NACL 20-0.9 MG/250ML-% IV SOLN
INTRAVENOUS | Status: DC | PRN
  Administered 2024-02-18: 50 ug/min via INTRAVENOUS

## 2024-02-18 NOTE — Progress Notes (Addendum)
 Advanced Heart Failure Rounding Note  Cardiologist: Alm Clay, MD  HF Consulting Cardiologist: Dr. Zenaida  Chief Complaint: Acute on chronic HFrEF  Subjective:   9/9 PTCA LAD d/t stent thrombosis. A fib w/ RVR. CO-OX 44 %. Started on milrinone  0.25 mcg.  9/10:  EGD and flex sig 9/10.  EGD with 1 superficial esophageal ulcer, LA grade C reflux esophagitis with no bleeding. Flex sig: Red blood in the rectum, sigmoid colon, descending colon and transverse colon. Few diverticula found. No obvious source of bleeding.  9/18 Milrinone  stopped.   CT A/P w/o on 09/22 w/ dilated loops of small bowel and colon w/ air fluid levels in distal colon and rectum w/ obstruction or pneumatosis.  Little improvement w/ neostigmine . Abdomen remains distended. No BM yesterday. Says he feels miserable. Also feels breathing is more shallow today. O2 sats 97% on RA.    Co-ox 55% off milrinone . CVP 9-10 today  Remains in Afib 110s.    Objective:   Weight Range: 80.7 kg Body mass index is 24.13 kg/m.   Vital Signs:   Temp:  [96.4 F (35.8 C)-98.1 F (36.7 C)] 97.6 F (36.4 C) (09/26 0735) Pulse Rate:  [95-111] 95 (09/26 0735) Resp:  [14-20] 20 (09/26 0735) BP: (100-115)/(70-85) 101/74 (09/26 0735) SpO2:  [93 %-99 %] 96 % (09/26 0735) Weight:  [80.7 kg] 80.7 kg (09/26 0554) Last BM Date : 02/16/24  Weight change: Filed Weights   02/13/24 0456 02/14/24 0354 02/18/24 0554  Weight: 81.5 kg 83.1 kg 80.7 kg    Intake/Output:   Intake/Output Summary (Last 24 hours) at 02/18/2024 0942 Last data filed at 02/18/2024 0357 Gross per 24 hour  Intake 374.97 ml  Output 1350 ml  Net -975.03 ml     Physical Exam   Vitals:   02/18/24 0300 02/18/24 0735  BP: 101/70 101/74  Pulse: 98 95  Resp: 19 20  Temp: 98.1 F (36.7 C) 97.6 F (36.4 C)  SpO2: 93% 96%   Vitals:   02/18/24 0300 02/18/24 0735  BP: 101/70 101/74  Pulse: 98 95  Resp: 19 20  Temp: 98.1 F (36.7 C) 97.6 F (36.4 C)   SpO2: 93% 96%   CVP 9-10  GENERAL: fatigued appearing  Lungs- decreased BS at the bases bilaterally  CARDIAC:  JVD 9 cm         Irregularly irregular rate and rhythm  ABDOMEN: Markedly distended, mild diffuse tenderness, + active BS EXTREMITIES: warm and well perfused  NEUROLOGIC: No obvious FND    Telemetry   Afib 90s-low 100s, personally reviewed   Labs    CBC Recent Labs    02/17/24 0510 02/18/24 0440  WBC 4.4 4.3  HGB 11.2* 10.9*  HCT 34.9* 34.2*  MCV 87.5 87.9  PLT 698* 649*   Basic Metabolic Panel Recent Labs    90/74/74 0510 02/18/24 0440  NA 134* 133*  K 3.3* 3.4*  CL 100 97*  CO2 23 21*  GLUCOSE 134* 228*  BUN 41* 39*  CREATININE 1.47* 1.40*  CALCIUM  8.7* 8.1*  MG  --  2.1   Liver Function Tests Recent Labs    02/17/24 0510 02/18/24 0440  AST 14* 13*  ALT 15 15  ALKPHOS 73 68  BILITOT 0.4 0.8  PROT 6.2* 5.8*  ALBUMIN 2.9* 2.7*    Medications:   Scheduled Medications:  apixaban   5 mg Oral BID   Chlorhexidine  Gluconate Cloth  6 each Topical Q0600   digoxin   0.0625 mg  Oral Daily   famotidine   20 mg Oral Daily   feeding supplement  237 mL Oral TID BM   Gerhardt's butt cream   Topical BID   magic mouthwash  5 mL Oral QID   melatonin  5 mg Oral QHS   multivitamin with minerals  1 tablet Oral Daily   nystatin    Topical BID   pantoprazole   40 mg Oral BID   rifaximin   550 mg Oral TID   rosuvastatin   20 mg Oral Daily   sodium chloride  flush  10-40 mL Intracatheter Q12H   sodium chloride  flush  3 mL Intravenous Q12H   spironolactone   25 mg Oral Daily   ticagrelor   90 mg Oral BID   torsemide   40 mg Oral Daily    Infusions:  amiodarone  30 mg/hr (02/18/24 0011)   potassium chloride  10 mEq (02/18/24 0811)     PRN Medications: acetaminophen , ALPRAZolam , atropine , diphenhydrAMINE , guaiFENesin -dextromethorphan , ipratropium-albuterol , metoCLOPramide  (REGLAN ) injection, mouth rinse, polyethylene glycol, simethicone , sodium chloride  flush,  sodium chloride  flush  Patient Profile  Mr Chase Combs is a 78 year old with a history of ICM, chronic biventricular HFrEF, CAD s/p PCI to LAD in 08/25.      Readmitted 01/22/24 with abdominal pain/nausea/vomiting. Hospital course complicated by ischemic colitis w/ septic shock/GI bleed, stent thrombosis involving recent LAD stent and low-output HF.   Assessment/Plan  CAD, recent STEMI d/t stent thrombosis - 01/17/24- PCI/DES LAD  - 02/01/24 Cangrelor  stopped given Plavix  load and ST elevation. Acute stent thrombosis involving LAD stent treated with PTCA. - Plavix  Genetic Testing collected, pending (sent 9/10, checked with lab 9/22 and still in process) - stable w/o CP  - Continue ticagrelor  90mg  BID.  - Off aspirin  with GI bleed.  - Continue high-intensity statin.   New A fib RVR - Continue amiodarone  30/hr. Rate 110s today.  - Given his low output HF, would avoid giving additional IV beta blocker. - Hold off on DCCV until GI issues have resolved - Continue Eliquis  5 BID. Hgb stable. - Continue digoxin  0.0625   A/C HFrEF, ICM   -01/13/24 Echo EF  20-25% RV moderately reduced.   -02/01/24 Echo LVEF 20-25% RV moderately reduced.    - CO-OX 53% with Fick CI 2.2 on 0.125 milrinone . Milrinone  stopped 9/18.  - CO-OX 55% off milrinone . - CVP 10-11, repeat IV Lasix  80 mg x 1 give K supp  - Off SGLT2i with fungal rash - Continue spiro 25 mg daily - Continue digoxin  0.0625 mcg daily. Dig level < 0.6. - No BP room to further titrate GDMT - Renal function stable.    Anemia in the setting of GI Bleed -Transfused 9/8 with appropriate rise.  - 9/10 he had large bloody BM. Unfortunately given 600 mg Ibuprofen .  - Protonix  and Pepcid  per GI - GI following.  - S/p EGD and flex sig.  EGD with 1 superficial esophageal ulcer, LA grade C reflux esophagitis with no bleeding. Flex sig: Red blood in the rectum, sigmoid colon, descending colon and transverse colon. Few diverticula found. No obvious source of  bleeding, possibly diverticular per GI. - Hgb stable.   Intestinal pneumatosis w/ concern for ischemic colitis Ileus Psuedoobstruction - Gen surgery recommended conservative management - Treated with antibiotics for suspected septic shock. - Transverse colon measures 9.3 cm on Xray last week.  - CT A/P w/o on 09/22 w/ dilated loops of small bowel and colon w/ air fluid levels in distal colon and rectum w/ obstruction or pneumatosis - Getting  neostigmine  but poor response. Abdomen much more distended today F/u abdominal X-ray pending.  - Suspect will need endoscopic decompression. GI to determine today   Hypokalemia - K 3.4 - give K supp  - continue spironolactone .     Code status: DNR    Length of Stay: [address], PA-C  02/18/2024, 9:42 AM  Advanced Heart Failure Team Pager [phone] (M-F; 7a - 5p)  Please contact [facility] for night-coverage after hours (5p -7a ) and weekends on [email or web address]  Patient seen with PA, I formulated the plan and agree with the above note.   S/p colonic decompression.  Abdomen significantly less distended and he feels better.  HR in 90s, atrial fibrillation.  CVP 10-11.   General: NAD Neck: JVP 10 cm, no thyromegaly or thyroid  nodule.  Lungs: Clear to auscultation bilaterally with normal respiratory effort. CV: Nondisplaced PMI.  Heart irregular S1/S2, no S3/S4, no murmur.  No peripheral edema.   Abdomen: Soft, nontender, no hepatosplenomegaly, no distention.  Skin: Intact without lesions or rashes.  Neurologic: Alert and oriented x 3.  Psych: Normal affect. Extremities: No clubbing or cyanosis.  HEENT: Normal.   Feels significantly improved with colonic decompression.  Noted ischemic colitis transverse colon.  Breathing somewhat improved.   CVP remains elevated, will give Lasix  80 mg IV x 1 this evening with KCl replacement.  Continue torsemide  40 mg daily tomorrow.   He remains in AF on amiodarone  gtt + apixaban .  If he is  stable from a GI standpoint, aim for TEE-guided DCCV on Monday.   He remains on Brilinta  with recent stent thrombosis.  Pharmacy is looking into his clopidogrel  genetic testing (?can we transition him from Brilinta  + Eliquis  to Plavix  + Eliquis ).   Ezra Shuck 02/18/2024 3:24 PM

## 2024-02-18 NOTE — Assessment & Plan Note (Deleted)
 02/18/24 K 3.4 today. Given rapid afib and NPO status. Will give IV Kcl replacement.  02/19/24 K 3.5 today. Will give more KCL due to afib.  02/20/24 K 3.3 today. Give po and IV kcl. Will add scheduled doses of Kdur in the evening.  02/21/24 finally, K >4.0 today. K 4.4 today. Continue with kcl 40 meq doses at 6 pm and 10 pm at nightime.  02/22/24 keep K >4.0. today, K 3.9. give extra Kcl.

## 2024-02-18 NOTE — Procedures (Addendum)
 I spoke to the patient's wife by phone after the procedure.  I updated her to the procedure results and plan. Time provided for questions and answers and she thanked me for the call

## 2024-02-18 NOTE — Transfer of Care (Signed)
 Immediate Anesthesia Transfer of Care Note  Patient: Chase Combs  Procedure(s) Performed: COLONOSCOPY DECOMPRESSION, INTESTINE  Patient Location: PACU  Anesthesia Type:MAC  Level of Consciousness: drowsy and responds to stimulation  Airway & Oxygen Therapy: Patient Spontanous Breathing and Patient connected to face mask oxygen  Post-op Assessment: Report given to RN and Post -op Vital signs reviewed and stable  Post vital signs: Reviewed and stable  Last Vitals:  Vitals Value Taken Time  BP 105/71 02/18/24 13:25  Temp    Pulse 113 02/18/24 13:29  Resp 29 02/18/24 13:29  SpO2 99 % 02/18/24 13:29  Vitals shown include unfiled device data.  Last Pain:  Vitals:   02/18/24 1137  TempSrc: Temporal  PainSc: 3       Patients Stated Pain Goal: 0 (02/13/24 2338)  Complications: No notable events documented.

## 2024-02-18 NOTE — Anesthesia Postprocedure Evaluation (Signed)
 Anesthesia Post Note  Patient: Chase Combs  Procedure(s) Performed: COLONOSCOPY DECOMPRESSION, INTESTINE     Patient location during evaluation: PACU Anesthesia Type: MAC Level of consciousness: awake and alert Pain management: pain level controlled Vital Signs Assessment: post-procedure vital signs reviewed and stable Respiratory status: spontaneous breathing, nonlabored ventilation, respiratory function stable and patient connected to nasal cannula oxygen Cardiovascular status: stable and blood pressure returned to baseline Postop Assessment: no apparent nausea or vomiting Anesthetic complications: no   No notable events documented.  Last Vitals:  Vitals:   02/18/24 1409 02/18/24 1416  BP: (!) 88/67 (!) 93/59  Pulse: 99 99  Resp: (!) 21 18  Temp: 36.6 C 36.6 C  SpO2: 92% 90%    Last Pain:  Vitals:   02/18/24 1416  TempSrc: Oral  PainSc: 0-No pain                 Epifanio Lamar BRAVO

## 2024-02-18 NOTE — Progress Notes (Signed)
 OT Cancellation Note  Patient Details Name: JIYAN WALKOWSKI MRN: [id number] DOB: 1945-10-30   Cancelled Treatment:    Reason Eval/Treat Not Completed: Other (comment).  Pt. Declined skilled OT treatment session secondary to stomach is very distended and painful. Will check back as schedule allows and pt. Able to participate.    CHRISTELLA Nest Lorraine-COTA/L  02/18/2024, 8:48 AM

## 2024-02-18 NOTE — Anesthesia Preprocedure Evaluation (Signed)
 Anesthesia Evaluation  Patient identified by MRN, date of birth, ID band Patient awake    Reviewed: Allergy & Precautions, NPO status , Patient's Chart, lab work & pertinent test results  History of Anesthesia Complications (+) PONV and history of anesthetic complications  Airway Mallampati: II  TM Distance: >3 FB Neck ROM: Full    Dental  (+) Missing, Dental Advisory Given, Partial Upper,    Pulmonary sleep apnea (no cpap)    breath sounds clear to auscultation       Cardiovascular hypertension, Pt. on medications and Pt. on home beta blockers (-) angina + CAD, + Past MI (2006 2010), +CHF and + DOE   Rhythm:Regular  LV function severely reduced. RV mildly reduced on todays echo On milrinone  0.5 Amio for afib   Neuro/Psych negative neurological ROS  negative psych ROS   GI/Hepatic Neg liver ROS,,,GI hemorrhage, epigastric pain, large volume rectal bleeding   Endo/Other  neg diabetes    Renal/GU negative Renal ROS     Musculoskeletal  (+) Arthritis , Osteoarthritis,    Abdominal   Peds  Hematology  (+) Blood dyscrasia, anemia Hg 7.3, 1pRBC actively being transfused   Anesthesia Other Findings   Reproductive/Obstetrics                              Anesthesia Physical Anesthesia Plan  ASA: 4  Anesthesia Plan: MAC   Post-op Pain Management:    Induction:   PONV Risk Score and Plan: 2 and Propofol  infusion and TIVA  Airway Management Planned: Natural Airway and Simple Face Mask  Additional Equipment: None  Intra-op Plan:   Post-operative Plan:   Informed Consent: I have reviewed the patients History and Physical, chart, labs and discussed the procedure including the risks, benefits and alternatives for the proposed anesthesia with the patient or authorized representative who has indicated his/her understanding and acceptance.     Dental advisory given  Plan Discussed  with: CRNA  Anesthesia Plan Comments:          Anesthesia Quick Evaluation

## 2024-02-18 NOTE — Plan of Care (Signed)
  Problem: Education: Goal: Knowledge of General Education information will improve Description: Including pain rating scale, medication(s)/side effects and non-pharmacologic comfort measures Outcome: Progressing   Problem: Health Behavior/Discharge Planning: Goal: Ability to manage health-related needs will improve Outcome: Progressing   Problem: Clinical Measurements: Goal: Diagnostic test results will improve Outcome: Not Progressing Goal: Respiratory complications will improve Outcome: Progressing   Problem: Activity: Goal: Risk for activity intolerance will decrease Outcome: Progressing   Problem: Nutrition: Goal: Adequate nutrition will be maintained Outcome: Not Progressing   Problem: Metabolic: Goal: Ability to maintain appropriate glucose levels will improve Outcome: Progressing

## 2024-02-18 NOTE — Progress Notes (Signed)
 PROGRESS NOTE    Chase Combs  FMW:[id number] DOB: February 08, 1946 DOA: 01/22/2024 PCP: Wonda Worth SQUIBB, PA  Subjective: Pt seen and examined. HR improved. Down to 105. Pt states he has not had any BM overnight. Appears he was given 1 mg IV neostigmine  instead of SQ injection. He states he was extremely nauseated with IV neostigmine .  Abd XR still show extremely dilated colon.   Hospital Course: CC: weakness, emesis HPI: 78 year old man w/ hx of ischemic cardiomyopathy and very recent LAD stent placement for NSTEMI discharged 8/27 (no d/c summary so unclear) with progressive nausea vomiting with CT images concerning for ischemic colitis.   Feeling weak and unwell since discharge 3 days ago.  Progressive nausea and vomiting not keeping down his antiplatelet medicines.  CT images reveal intestinal air in the wall of the intestine as well as concerning findings of ileus.  His lactate was elevated.  He was volume resuscitated.  Remained hypotensive.  Placed on broad-spectrum antibiotics he received vancomycin  Zosyn  doxycycline and Flagyl .  Labs notable for acute renal failure.  Anion gap metabolic acidosis.  Leukopenia, lymphopenia.  Significant Events: Admitted 01/22/2024 to ICU by PCCM due to intestinal pneumatosis concerning for ischemic colitis 01-24-2024 to 01-27-2024 eneral surgery recommended conservative management.   Concern patient not able to absorb antiplatelet therapy, he was placed on IV cangrelor .  Patient continue to be hypotensive despite IV fluids and was placed on norepinephrine  infusion, consistent with septic shock due to abdominal source/ ischemic colitis.  during hospitalization required PRBC transfusion due to blood loss anemia.   There was concerns of in-stent thrombosis from recent PCI therefore antiplatelets were switched from Cangrelor  to Plavix  and started on milrinone  drip due to cardiogenic shock.   Developed atrial fibrillation with rapid ventricular response, placed on IV  amiodarone .  Transition to Aggrastat for anticoagulation.  09/05 GI consulted, clinical picture consistent with colonic ileus, Ogilvie syndrome.  09/08 PRBC transfusion x1, transitioned to clopidogrel .  09/09 chest pain with new antero lateral ST elevations. Underwent urgent cardiac catheterization, LAD with ostially occluded stent with acute stent thrombosis (Culprit vessel). Successful percutaneous coronary intervention ostial proximal LAD, PTCA with balloon dilatation.  Significant volume overloaded with low SV02 and CVP very high, placed on IV furosemide  and milrinone .  09/10 completed antibiotic course.  09/10 EGD with esophageal ulcer with no bleeding and no stigmata of recent bleeding.  LA grade C reflux esophagitis with no bleeding.  Duodenal erosions without bleeding.  Flexible sigmoidoscopy positive blood in the rectum, in the sigmoid colon, in the descending colon and in the transverse colon. No obvious source of bleed.  Diverticulosis in the sigmoid colon and in the descending colon. No active bleeding.  A single superficial ulcer in the rectum. No active bleeding.   09/12 transition to ticagrelor .  Slowly weaned off TPN, diet as tolerated.   09/19 continue colonic pseudo obstruction, imaging with progression of colonic distention. Started on rifaximin .  Pending cardioversion until bowel function improves.  09/21 pending direct current cardioversion.  09/22 CT abdomen and pelvis dilated loops of small bowel and colon with air fluid levels in the distal colon and rectum. No obstruction. Appearance consistent with ileus or functional bowel disturbance.  09/23 trial of neostigmine   02-18-2024 persistent colonic pseudo obstruction despite several days of SQ neostigmine  and 1 dose of IV neostigmine . All without improvement in his symptoms. Abd XR continue to show dilated colon.  Admission Labs: Na 132, K 4.5, CO2 of 31, BUN 64, scr  2.55, glu 197 T prot 6.2, alb 3.0, AST 29, ALT 31, alk pho 64,  t. Bili 1.4 WBC 2.4, HgB 13.9, plt 489 BNP 220 Procalcitonin 1.23  Admission Imaging Studies: CXR Low lung volumes with bibasilar linear and patchy opacities. 2. Small stable bilateral pleural effusion. 3. Elevated right hemidiaphragm. CT chest/abd/pelvis Significantly improved multifocal, bilateral pneumonia since 01/14/24. Persistent airspace and ground glass opacities in the right lower lobe, left lower lobe, and posterior left upper lobe/lingula, likely residual pneumonia or aspiration. 2. Gas along the wall of the ascending colon and cecum, suspicious for pneumatosis. Findings called to PA [place] at 3:19 pm. 3. Fluid-filled colon suggests a diarrheal state. Diffuse colonic and small bowel distention likely represent Ileus. 4. Increased small volume pelvic and new right-sided pericolonic trace fluid, likely secondary to the colonic proces. 5. Incidental findings, including: Pulmonary artery enlargement suggests pulmonary arterial hypertension. Aortic and coronary artery atherosclerosis. Right nephrolithiasis. Suspicion of minimal chronic calcific pancreatitis.  Significant Labs:   Significant Imaging Studies: 01-30-2024 CTA GI bleed Severe narrowing is noted at origin of celiac artery with poststenotic dilatation. No definite evidence of contrast extravasation to suggest gastrointestinal bleeding. Mild wall and fold thickening of ascending colon is noted suggesting possible infectious or inflammatory colitis 02-02-2024 echo LVEF 20-25%. eft ventricle has severely decreased function  02-14-2024 CT abd/pelvis Dilated loops of small bowel and colon with air-fluid levels in the distal colon and rectum indicating diarrheal process. No lead point for obstruction is identified. No pneumatosis or free air. Presumably the appearance is due to ileus or functional bowel disturbance. 2. 0.3 cm right kidney lower pole nonobstructive stone. 3. Multilevel lumbar degenerative facet arthropathy with multilevel  foraminal impingement. 4. Umbilical hernia contains adipose tissue. 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 5. High density in the gallbladder probably from sludge. 6. Mild bibasilar atelectasis. 7. Aortic Atherosclerosis (ICD10-I70.0). Coronary atherosclerosis. Prominent cardiomegaly.  Antibiotic Therapy: Anti-infectives (From admission, onward)    Start     Dose/Rate Route Frequency Ordered Stop   02/11/24 1000  rifaximin  (XIFAXAN ) tablet 550 mg        550 mg Oral 3 times daily 02/11/24 0908     02/01/24 1645  cefTRIAXone  (ROCEPHIN ) 2 g in sodium chloride  0.9 % 100 mL IVPB  Status:  Discontinued        2 g 200 mL/hr over 30 Minutes Intravenous Every 24 hours 02/01/24 1553 02/03/24 1029   01/29/24 2200  metroNIDAZOLE  (FLAGYL ) tablet 500 mg  Status:  Discontinued        500 mg Oral Every 12 hours 01/29/24 1627 02/03/24 1029   01/22/24 2200  piperacillin -tazobactam (ZOSYN ) IVPB 3.375 g        3.375 g 12.5 mL/hr over 240 Minutes Intravenous Every 8 hours 01/22/24 1602 01/28/24 2359   01/22/24 1800  metroNIDAZOLE  (FLAGYL ) IVPB 500 mg  Status:  Discontinued        500 mg 100 mL/hr over 60 Minutes Intravenous Every 8 hours 01/22/24 1653 01/23/24 0857   01/22/24 1545  metroNIDAZOLE  (FLAGYL ) IVPB 500 mg  Status:  Discontinued        500 mg 100 mL/hr over 60 Minutes Intravenous  Once 01/22/24 1533 01/22/24 1634   01/22/24 1330  doxycycline (VIBRAMYCIN) 100 mg in sodium chloride  0.9 % 250 mL IVPB  Status:  Discontinued        100 mg 125 mL/hr over 120 Minutes Intravenous  Once 01/22/24 1320 01/22/24 1649   01/22/24 1315  azithromycin (ZITHROMAX) 500 mg  in sodium chloride  0.9 % 250 mL IVPB  Status:  Discontinued        500 mg 250 mL/hr over 60 Minutes Intravenous  Once 01/22/24 1309 01/22/24 1319   01/22/24 1315  ceFEPIme  (MAXIPIME ) 2 g in sodium chloride  0.9 % 100 mL IVPB        2 g 200 mL/hr over 30 Minutes Intravenous  Once 01/22/24 1309 01/22/24 1515   01/22/24 1315   vancomycin  (VANCOREADY) IVPB 1750 mg/350 mL  Status:  Discontinued        1,750 mg 175 mL/hr over 120 Minutes Intravenous  Once 01/22/24 1311 01/22/24 1756       Procedures: 02-01-2024 LHC LM: Distal 45% stenosis,  LAD: Ostially occluded stent with acute stent thrombosis (Culprit vessel),  [place]: 50% in-stent restenosis,  Lcx: Mild diffuse disease,  RCA: Not engaged today. LVEDP 27 mmHg.  Successful percutaneous coronary intervention ostial prox LAD.  PTCA with serial balloon dilatation, up to 3.0  X 12 mm [place] balloon up to 20 atm  Consultants: PCCM Cardiology General surgery    Assessment and Plan: * Pneumatosis intestinalis of large intestine-resolved as of 02/16/2024 01-22-2024 to 01-29-2024.  admitted to ICU by PCCM. General surgery consulted. CT abd showed pneumatosis. Surgery did not feel that pt was a operative candidate. IV abx started. Pt developed gastric distension and ileus.  Due to N/V and ileus, NG tube was placed for gastric decompression. By 01-26-2024, abd distension improved, NG tube removed and clear liquid diet started.   Atrial fibrillation with RVR (HCC) 02-04-2024 pt started on digoxin  and IV amiodarone  for rate control as pt's blood pressure too low to start any other rate controlled meds due to cardiogenic shock.  02-05-2024 through 02-15-2024 afib rate remains uncontrolled. Due to ongoing colonic ileus, cardiology does not want to cardiovert.  02-16-2024 remains on IV amiodarone  gtts. Rate still in the mid 100s. SBP borderline low at 90-100s  02/17/24 pt remains on IV amiodarone  and eliquis . HR still uncontrolled. Cardiology managing his rapid afib.  02/18/24 remains on IV amiodarone  and eliquis . Cards managing afib. Potential for DCCV after colonic issues resolved.   Colonic pseudoobstruction 01-27-2025 GI consulted due to colonic ileus that was not responding to conservative measures. Pt has persistent colonic distension.  GI recommended to continue Flexiseal and  to treat empiric treatment with flagyl  for presumed SIBO(small intestine bacterial overgrowth).  02-01-2024 pt develops rectal bleeding and drops his HgB from 9.2 g/dl to 7.3 g/dl.  02-02-2024 pt taken for flex sigmoidoscopy and EGD that shows bright red blood in the colon from the rectum to the transverse colon underlying mucosa appeared normal few diverticuli noted in the left and sigmoid no active bleeding, single nonbleeding superficial ulcer in the rectum.  EGD showed superficial esophageal ulcer with no bleeding, grade C esophagitis with no bleeding and a few erosions in the first portion of the duodenum  02-03-2024 to 02-10-2024 persistent abd distension despite liquid bowel movements.    02-11-2024 Xifaxan  started for additional SIBO treatment due to persistent abdominal distension.   02-15-2024 after 72 hours of no improvement with treatment with Xifaxan  for SIBO, pt started on SQ neostigmine  with IV robinul  to help with colonic decompression.  02-16-2024 GI continuing with SQ neostigmine  q6h.  02-17-2024 GI managing colonic ileus. Abd XR ordered. Pt may need endoscopic decompression. GI has made pt NPO.  02/18/24 Pt states he has not had any BM overnight. Appears he was given 1 mg IV neostigmine  instead of SQ  injection. He states he was extremely nauseated with IV neostigmine . Today, Abd XR still show extremely dilated colon. XR no changes from yesterday.   AKI (acute kidney injury) 01-22-2024 through 01-28-2024. Admitted with AKI with Scr of 2.55. with IVF and treatment of his ileus/intestinal pneumatosis, his scr return to 1.07 by 01-28-2024  02-08-2024 through 02-11-2024 he develops another AKI during his cardiogenic shock. By 02-10-2024, scr back to 1.06  02-13-2024 scr back on the rise again after uncontrolled afib and starting of aldcatone/demadex .  02/17/24 Scr stable at 1.4 - 1.5 on demadex  and aldactone .  02/18/24 Scr stable at 1.4   Small intestinal bacterial overgrowth  (SIBO) 01-27-2025 GI consulted due to colonic ileus that was not responding to conservative measures. Pt has persistent colonic distension.  GI recommended to continue Flexiseal and to treat empiric treatment with flagyl  for presumed SIBO(small intestine bacterial overgrowth).   02-03-2024 to 02-10-2024 persistent abd distension despite liquid bowel movements.     02-11-2024 Xifaxan  started for additional SIBO treatment due to persistent abdominal distension.    02-15-2024 after 72 hours of no improvement with treatment with Xifaxan  for SIBO, pt started on IV neostigmine  with IV robinul  to help with colonic decompression.   02-16-2024 GI continuing with SQ neostigmine  q6h.  02/17/24 remains on Xifaxan  TID. GI treating him for 14 days. End date 02-24-2024. During prior admission, flagyl  seemed to work but no longer.  02/18/24 continues with Xifaxan  TID. End date of therapy 02-24-2024.   Acute on chronic systolic CHF (congestive heart failure) (HCC) 02-02-2024 pt develops cardiogenic shock after STEMI requiring PTCA. Pt placed on milrinone  for cardiovascular support.  02-04-2024 through 02-09-2024 pt started on IV lasix  gtts for diuresis while he has cardiogenic shock to help remove intravascular volume  02-14-2024 po demadex  and aldactone  started.  02/17/24 stable on po demadex  and aldactone . Scr 1.4-1.5 over the last 4-5 days.  02/18/24 stable. On demadex  and aldactone .   Hypokalemia 02/18/24 K 3.4 today. Given rapid afib and NPO status. Will give IV Kcl replacement.  Cardiogenic shock (HCC)-resolved as of 02/16/2024 02-01-2024 after his STEMI from in-stent restenosis requiring PCTA on 02-01-2024. Pt develops cardiogenic shock requiring IV milrinone  and transfer to CICU.  02-02-2024 to 02-09-2024 echo shows LVEF dropped to 20-25%. Pt kept on milrinone  until 02-09-2024 when it was eventually weaned off.  Anemia due to acute blood loss-resolved as of 02/16/2024 GI bleed.   SP 4 units PRBC  transfusion.   EGD with flexible sigmoidoscopy with red blood in the colon.  Grade 1 esophagitis.  Continue pantoprazole .  Follow up hgb is 11.3   Acute blood loss anemia-resolved as of 02/16/2024 02-01-2024 pt develops rectal bleeding and drops his HgB from 9.2 g/dl to 7.3 g/dl.  02-02-2024 pt transfused 2 units PRBC.  pt taken for flex sigmoidoscopy and EGD that shows bright red blood in the colon from the rectum to the transverse colon underlying mucosa appeared normal few diverticuli noted in the left and sigmoid no active bleeding, single nonbleeding superficial ulcer in the rectum.  EGD showed superficial esophageal ulcer with no bleeding, grade C esophagitis with no bleeding and a few erosions in the first portion of the duodenum  Gastrointestinal hemorrhage-resolved as of 02/16/2024 02-01-2024 pt develops rectal bleeding and drops his HgB from 9.2 g/dl to 7.3 g/dl.   02-02-2024 pt taken for flex sigmoidoscopy and EGD that shows bright red blood in the colon from the rectum to the transverse colon underlying mucosa appeared normal few diverticuli noted  in the left and sigmoid no active bleeding, single nonbleeding superficial ulcer in the rectum.  EGD showed superficial esophageal ulcer with no bleeding, grade C esophagitis with no bleeding and a few erosions in the first portion of the duodenum  Type 2 diabetes mellitus with hyperlipidemia (HCC) 02-16-2024 CBG stable without SSI  02/17/24 CBG stable  02/18/24 stable CBG  Pressure injury of skin Wound 02/01/24 1600 Pressure Injury Buttocks Stage 2 -  Partial thickness loss of dermis presenting as a shallow open injury with a red, pink wound bed without slough. (Active)    Malnutrition of moderate degree Nutrition Status: Nutrition Problem: Moderate Malnutrition Etiology: acute illness Signs/Symptoms: energy intake < 75% for > 7 days, moderate muscle depletion, mild muscle depletion Interventions: Boost Breeze, MVI, Refer to RD  note for recommendations    Essential hypertension 02-17-2024 unable to start HTN meds due to low BP  02/18/24 BP still borderline low due to rapid afib.  Coronary artery disease involving native coronary artery of native heart without angina pectoris 09-09--2025 pt has in-stent thrombosis causing STEMI. Taken to cath lab for [facility] to PTCA.  02-16-2024 pt on Brilinta . ASA on hold due to GI bleed on 02-02-2024.  Restart crestor  20 mg daily.  02/17/24 remains on Brilinta  and Eliquis . No ASA due to GI bleeding on 02-02-2024.  02/18/24 stable on Eliquis  and Brilinta . Cards holding on ASA due to GI bleeding on 02-02-2024   ST elevation myocardial infarction involving left main coronary artery (HCC)-resolved as of 02/16/2024 02-01-2024 At around 1:15 PM, patient started having right shoulder pain and started feeling diaphoretic. EKG showed new anterolateral ST elevation. Cardiology felt that pt likely had occluded his recently placed LAD stent with several days of likely suboptimal platelet inhibition. Pt taken emergently to cath lab where LHC showed LAD: Ostially occluded stent with acute stent thrombosis. Pt subsequently develops cardiogenic shock and requires IV milrinone  for 7 days.    DVT prophylaxis: Place and maintain sequential compression device Start: 02/04/24 1132 Place TED hose Start: 02/04/24 0941 SCD's Start: 02/01/24 1555 apixaban  (ELIQUIS ) tablet 5 mg     Code Status: Do not attempt resuscitation (DNR) PRE-ARREST INTERVENTIONS DESIRED Family Communication: talked with pt's wife [name] over phone today and gave her update Disposition Plan: home vs snf Reason for continuing need for hospitalization: remains NPO, on IV amiodarone  gtts. GI considering endoscopy today for colonic decompression.  Objective: Vitals:   02/17/24 2300 02/18/24 0300 02/18/24 0554 02/18/24 0735  BP: 101/76 101/70  101/74  Pulse: 100 98  95  Resp: 18 19  20   Temp: 97.7 F (36.5 C) 98.1 F (36.7 C)   97.6 F (36.4 C)  TempSrc: Oral Oral  Oral  SpO2: 96% 93%  96%  Weight:   80.7 kg   Height:        Intake/Output Summary (Last 24 hours) at 02/18/2024 1029 Last data filed at 02/18/2024 0357 Gross per 24 hour  Intake 374.97 ml  Output 1350 ml  Net -975.03 ml   Filed Weights   02/13/24 0456 02/14/24 0354 02/18/24 0554  Weight: 81.5 kg 83.1 kg 80.7 kg    Examination:  Physical Exam Vitals and nursing note reviewed.  Constitutional:      Appearance: He is obese.     Comments: Appears chronically ill  HENT:     Head: Normocephalic and atraumatic.  Cardiovascular:     Rate and Rhythm: Tachycardia present. Rhythm irregular.  Pulmonary:     Effort: Pulmonary effort is  normal. No respiratory distress.  Abdominal:     General: Bowel sounds are decreased. There is distension.     Comments: Absent BS. Distended. No longer having high pitched BS. No pain with palpation of his abd  Musculoskeletal:     Comments: Trace/+1 pretibial/ankle edema.  Skin:    General: Skin is warm and dry.     Capillary Refill: Capillary refill takes less than 2 seconds.  Neurological:     Mental Status: He is alert and oriented to person, place, and time.     Data Reviewed: I have personally reviewed following labs and imaging studies  CBC: Recent Labs  Lab 02/14/24 0420 02/15/24 0505 02/16/24 0515 02/17/24 0510 02/18/24 0440  WBC 6.2 3.3* 2.9* 4.4 4.3  HGB 11.3* 10.7* 10.8* 11.2* 10.9*  HCT 35.7* 33.9* 34.3* 34.9* 34.2*  MCV 89.3 89.0 88.9 87.5 87.9  PLT 723* 706* 694* 698* 649*   Basic Metabolic Panel: Recent Labs  Lab 02/12/24 0500 02/13/24 0445 02/14/24 0420 02/15/24 0505 02/16/24 0515 02/17/24 0510 02/18/24 0440  NA 137 136 135 133* 135 134* 133*  K 3.6 4.1 4.2 3.8 3.8 3.3* 3.4*  CL 103 104 103 98 102 100 97*  CO2 20* 22 22 21* 21* 23 21*  GLUCOSE 135* 141* 144* 139* 110* 134* 228*  BUN 26* 28* 30* 37* 44* 41* 39*  CREATININE 1.21 1.43* 1.45* 1.41* 1.53* 1.47* 1.40*   CALCIUM  8.6* 8.8* 9.2 9.0 8.6* 8.7* 8.1*  MG 2.4 2.2 2.4 2.2  --   --  2.1   GFR: Estimated Creatinine Clearance: 49.3 mL/min (A) (by C-G formula based on SCr of 1.4 mg/dL (H)). Liver Function Tests: Recent Labs  Lab 02/14/24 0420 02/15/24 0505 02/16/24 0515 02/17/24 0510 02/18/24 0440  AST 17 12* 14* 14* 13*  ALT 20 17 14 15 15   ALKPHOS 79 69 68 73 68  BILITOT 0.4 0.6 0.6 0.4 0.8  PROT 6.6 6.0* 5.8* 6.2* 5.8*  ALBUMIN 3.1* 2.8* 2.7* 2.9* 2.7*   BNP (last 3 results) Recent Labs    01/11/24 0539 01/13/24 0537 01/22/24 1243  BNP 842.8* 1,747.3* 220.4*   CBG: Recent Labs  Lab 02/15/24 1107 02/15/24 1559 02/15/24 2100 02/16/24 0601 02/16/24 2058  GLUCAP 146* 116* 131* 116* 166*   Radiology Studies: DG Abd 2 Views Result Date: 02/17/2024 EXAM: 2 VIEW XRAY OF THE ABDOMEN 02/17/2024 10:53:00 AM COMPARISON: None available. CLINICAL HISTORY: Ileus, unspecified. Patient has pain in abdomen. FINDINGS: BOWEL: Persistent dilatation of the small and large bowel loops with multiple air-fluid levels and transition point in the left lower quadrant of the abdomen at the level of the mid sigmoid colon. SOFT TISSUES: No opaque urinary calculi. BONES: No acute osseous abnormality. IMPRESSION: 1. No significant change in the appearance of diffuse colonic and small bowel dilatation compatible with ileus or functional bowel obstruction Electronically signed by: Livsey Stroud MD 02/17/2024 12:01 PM EDT RP Workstation: [id number]    Scheduled Meds:  apixaban   5 mg Oral BID   Chlorhexidine  Gluconate Cloth  6 each Topical Q0600   digoxin   0.0625 mg Oral Daily   famotidine   20 mg Oral Daily   feeding supplement  237 mL Oral TID BM   Gerhardt's butt cream   Topical BID   magic mouthwash  5 mL Oral QID   melatonin  5 mg Oral QHS   multivitamin with minerals  1 tablet Oral Daily   nystatin    Topical BID   pantoprazole   40 mg  Oral BID   rifaximin   550 mg Oral TID   rosuvastatin   20 mg Oral  Daily   sodium chloride  flush  10-40 mL Intracatheter Q12H   sodium chloride  flush  3 mL Intravenous Q12H   spironolactone   25 mg Oral Daily   ticagrelor   90 mg Oral BID   torsemide   40 mg Oral Daily   Continuous Infusions:  amiodarone  30 mg/hr (02/18/24 0011)   potassium chloride  10 mEq (02/18/24 0811)     LOS: 27 days   Time spent: 60 minutes  Camellia Door, DO  [facility]  02/18/2024, 10:29 AM

## 2024-02-18 NOTE — Progress Notes (Signed)
 Patient ID: Chase Combs, male   DOB: 1945-12-17, 78 y.o.   MRN: [id number]    Progress Note   Subjective   Day # 26 CC; persistent small bowel/colonic ileus/pseudoobstruction in setting of very recent LAD stent with in-stent stenosis, NSTEMI, rapid A-fib, ischemic colitis, and severe heart failure with EF approximately 20%. On Brilinta , aspirin  and Eliquis   Abdominal films yesterday showed persistently dilated large and small bowel  And was given a dose of IV neostigmine  late yesterday afternoon, he had very little results, had a small bowel movement but did not pass much flatus and says he felt worse after the neostigmine  with a choking sensation. Has not had any further bowel movement overnight, was sent for plain films this morning that look about the same with significant distention of the colon and small bowel, no critical distention.  Patient just had a bowel movement on his own but says he has not been passing any flatus his abdomen still feels very tight and uncomfortable.  He feels that something has to be done. Labs today WBC 4.3/hemoglobin 10.9/hematocrit 34.2 Sodium 133/potassium 3.4/BUN 39/creatinine 1.40 Co. ox panel with O2 sat 54.9   Objective   Vital signs in last 24 hours: Temp:  [96.4 F (35.8 C)-98.1 F (36.7 C)] 97.9 F (36.6 C) (09/26 1058) Pulse Rate:  [95-100] 99 (09/26 1058) Resp:  [14-20] 20 (09/26 1058) BP: (101-115)/(70-85) 105/71 (09/26 1058) SpO2:  [93 %-99 %] 96 % (09/26 0735) Weight:  [80.7 kg] 80.7 kg (09/26 0554) Last BM Date : 02/16/24 General:    Elderly white male in no acute distress, acute and chronically ill-appearing uncomfortable Heart:  tachyRegular rate and rhythm; no murmurs Lungs: Respirations even and unlabored, lungs CTA bilaterally Abdomen: Large, protuberant, distended, tympanitic bowel sounds are present but decreased with some high-pitched tinkling rather diffuse tenderness Extremities:  Without edema. Neurologic:  Alert and  oriented,  grossly normal neurologically. Psych:  Cooperative. Normal mood and affect.  Intake/Output from previous day: 09/25 0701 - 09/26 0700 In: 615 [P.O.:240; I.V.:375] Out: 1350 [Urine:1050; Stool:300] Intake/Output this shift: Total I/O In: 51.1 [I.V.:51.1] Out: -   Lab Results: Recent Labs    02/16/24 0515 02/17/24 0510 02/18/24 0440  WBC 2.9* 4.4 4.3  HGB 10.8* 11.2* 10.9*  HCT 34.3* 34.9* 34.2*  PLT 694* 698* 649*   BMET Recent Labs    02/16/24 0515 02/17/24 0510 02/18/24 0440  NA 135 134* 133*  K 3.8 3.3* 3.4*  CL 102 100 97*  CO2 21* 23 21*  GLUCOSE 110* 134* 228*  BUN 44* 41* 39*  CREATININE 1.53* 1.47* 1.40*  CALCIUM  8.6* 8.7* 8.1*   LFT Recent Labs    02/18/24 0440  PROT 5.8*  ALBUMIN 2.7*  AST 13*  ALT 15  ALKPHOS 68  BILITOT 0.8   PT/INR No results for input(s): LABPROT, INR in the last 72 hours.  Studies/Results: DG Abd 2 Views Result Date: 02/17/2024 EXAM: 2 VIEW XRAY OF THE ABDOMEN 02/17/2024 10:53:00 AM COMPARISON: None available. CLINICAL HISTORY: Ileus, unspecified. Patient has pain in abdomen. FINDINGS: BOWEL: Persistent dilatation of the small and large bowel loops with multiple air-fluid levels and transition point in the left lower quadrant of the abdomen at the level of the mid sigmoid colon. SOFT TISSUES: No opaque urinary calculi. BONES: No acute osseous abnormality. IMPRESSION: 1. No significant change in the appearance of diffuse colonic and small bowel dilatation compatible with ileus or functional bowel obstruction Electronically signed by: Birmingham Calk MD 02/17/2024  12:01 PM EDT RP Workstation: [id number]       Assessment / Plan:    #45 78 year old white male with recent NSTEMI secondary to in-stent thrombosis, heart failure with significantly reduced EF at 20%, new A-fib with RVR. This hospitalization complicated by probable right sided ischemic colitis and transient lower GI bleeding in setting of  Brilinta /aspirin /Eliquis .  After the episode of ischemic colitis he has developed what appears to be a small and large bowel ileus/pseudoobstruction which has been refractory. No response to subcu neostigmine  earlier in the week, he was given 1 dose of IV neostigmine  yesterday with no real improvement in symptoms but did not tolerate well with chest tightness and a choking sensation.  Abdominal films this morning look about the same-he has just had a small bowel movement.  #2 possible SIBO/completing a 14-day course of Xifaxan  #3 mild hypokalemia-continue replacement #4 malnutrition secondary to above-poor oral intake and no appetite  Plan keep n.p.o. this morning Patient will be scheduled for flex/colonoscopy with decompression per Dr. Albertus today.  Procedure has been discussed in detail with the patient including indications risk and benefits and he is agreeable to proceed  Continue to replace potassium and try to keep him at least 4.0 Add phosphorus with next labs Hopefully we can get him at least back on liquids with supplements post procedure today      Active Problems:   Coronary artery disease involving native coronary artery of native heart without angina pectoris   Essential hypertension   Hypokalemia   Acute on chronic systolic CHF (congestive heart failure) (HCC)   Malnutrition of moderate degree   Colonic pseudoobstruction   Small intestinal bacterial overgrowth (SIBO)   Atrial fibrillation with RVR (HCC)   Pressure injury of skin   AKI (acute kidney injury)   Type 2 diabetes mellitus with hyperlipidemia (HCC)     LOS: 27 days   Chase Kozma PA-C 02/18/2024, 11:07 AM

## 2024-02-18 NOTE — Op Note (Signed)
 [facility] Patient Name: Chase Combs Procedure Date : 02/18/2024 MRN: [id number] Attending MD: Gordy CHRISTELLA Starch , MD, [id number] Date of Birth: 30-Oct-1945 CSN: [id number] Age: 78 Admit Type: Inpatient Procedure:                Colonoscopy Indications:              Generalized abdominal pain, Abnormal CT of the GI                            tract, For therapy of Ogilvie's syndrome Providers:                Gordy CHRISTELLA. Starch, MD, Ozell Pouch, Coye Bade, Technician Referring MD:             [facility] Medicines:                Monitored Anesthesia Care Complications:            No immediate complications. Estimated Blood Loss:     Estimated blood loss: none. Procedure:                Pre-Anesthesia Assessment:                           - Prior to the procedure, a History and Physical                            was performed, and patient medications and                            allergies were reviewed. The patient's tolerance of                            previous anesthesia was also reviewed. The risks                            and benefits of the procedure and the sedation                            options and risks were discussed with the patient.                            All questions were answered, and informed consent                            was obtained. Prior Anticoagulants: The patient has                            taken Brilinta  (ticagrelor ), last dose was day of                            procedure. ASA Grade Assessment: IV - A patient  with severe systemic disease that is a constant                            threat to life. After reviewing the risks and                            benefits, the patient was deemed in satisfactory                            condition to undergo the procedure.                           After obtaining informed consent, the colonoscope                             was passed under direct vision. Throughout the                            procedure, the patient's blood pressure, pulse, and                            oxygen saturations were monitored continuously. The                            PCF-HQ190L ([id number]) Olympus colonoscope was                            introduced through the anus and advanced to the                            proximal transverse colon for evaluation. This was                            the intended extent. The colonoscopy was somewhat                            difficult due to abnormal anatomy. The patient                            tolerated the procedure well. The quality of the                            bowel preparation was none. Scope In: Scope Out: Findings:      The digital rectal exam was normal.      Segmental moderate inflammation characterized by altered vascularity,       erosions and shallow ulcerations was found in the proximal transverse       colon.      The lumen of the descending colon and transverse colon was significantly       dilated. Air and stool was removed with scope suction. A colonic       decompression tube was placed.      Scattered medium-mouthed and small-mouthed diverticula were found in the       sigmoid colon and descending colon. There was narrowing  of the colon in       association with the diverticular opening.      A guide wire was inserted though the scope and advanced to the hepatic       flexure under fluoroscopic guidance. A 10 Fr decompression was advanced       over the guide wire into the transverse colon. The colonoscope was       reinserted to ensure the tube had passed the mildly narrowed and       angulated sigmoid colon. Impression:               - Segmental moderate inflammation was found in the                            proximal transverse colon secondary to ischemic                            colitis.                           - Dilated in the  descending colon and in the                            transverse colon.                           - Mild diverticulosis in the sigmoid colon and in                            the descending colon. There was mild narrowing of                            the distal sigmoid colon.                           - Decompression tube placement was successfully                            performed.                           - No specimens collected. Moderate Sedation:      N/A Recommendation:           - Return patient to hospital ward for ongoing care.                           - Clear liquid diet.                           - Continue present medications.                           - Attach decompression tube to gravity drain and                            flush every 4 hours.                           -  Monitor clinical course. 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 All rights reserved. The codes documented in this report are preliminary and upon coder review may  be revised to meet current compliance  requirements. Gordy CHRISTELLA Starch, MD 02/18/2024 1:35:03 PM This report has been signed electronically. Number of Addenda: 0

## 2024-02-18 NOTE — Progress Notes (Signed)
 Nutrition Follow-up  DOCUMENTATION CODES:   Non-severe (moderate) malnutrition in context of acute illness/injury  INTERVENTION:  Add Boost Breeze po TID, each supplement provides 250 kcal and 9 grams of protein   Discontinue Ensure Plus High Protein po BID, each supplement provides 350 kcal and 20 grams of protein    Continue oral MVI w/ minerals   Monitor diet advancement and tolerance  If unable to advance/tolerate diet, recommend re-initiation of TPN to meet his estimated needs   Discontinue Magic cup TID with meals, each supplement provides 290 kcal and 9 grams of protein    NUTRITION DIAGNOSIS:  Moderate Malnutrition related to acute illness as evidenced by energy intake < 75% for > 7 days, moderate muscle depletion, mild muscle depletion.  GOAL:  Patient will meet greater than or equal to 90% of their needs  MONITOR:  PO intake, Supplement acceptance, Diet advancement, I & O's  REASON FOR ASSESSMENT:  Consult Calorie Count, Assessment of nutrition requirement/status, Diet education  ASSESSMENT:  Pt with PMH significant for: ischemic cardiomyopathy and recent LAD w/ stent placement for NSTEMI, HTN, arthritis, prostate cancer. Presented to ED with c/o nausea and vomiting. CT c/f intestinal pneumatosis w/ ischemic versus infectious colitis. Admitted w/ septic shock.  8/31 admitted 9/03 transitioned to [facility] care; diet advanced to CLD 9/04 NGT removed 9/05 - TPN initiated  9/07 FlexiSeal removed  9/08 - 1 unit PRBC transfused 9/09 - 1 unit PRBC transfused; downgraded to clear liquid diet 9/10 - EGD:  9/12 - advanced to full liquid diet 9/13 - advanced to DYS3/thin liquid diet 9/14 - TPN reduced to 1/2 9/15 - TPN stopped 9/19 - advanced to regular texture diet 9/22 - CT abd/pelvis: ileus or functional bowel disturbance 9/26 - flex/colonoscopy decompression   Patient not available at time of attempted assessment. Per GI note, patient with no response to subcutaneous  or IV neostigmine . Down for flex/colonoscopy decompression today. GI hopeful to advance to clear liquid diet post procedure.   Average Meal Intake 9/20: 25-75% x2 documented meals 9/21: 25% x1 documented meals 9/22: 100% x1 documented meals 9/25: 50% x1 documented meal   Documented with BMs yesterday. Abdomen remains distended. Intake declined d/t abdominal distention and patient preference. He has endorsed, since admission, the food at the hospital is unappetizing to him. If unable to tolerate or advance, will recommend re-initiation of TPN on Monday.   Admit Weight: 88.1 kg Current Weight: 80.7 kg +mild, non-pitting edema to BLEs   Weight stable throughout admission. Continues with documented mild-pitting edema. CVP back up 9-10 today from 5-7 at time of last assessment.  Given additional fluid on board and poor PO intake, suspect he has lost additional body weight. Will repeat NFPE on next visit.    Drains/Lines: L basilic: PICC, triple lumen (placed 9/01) Flatus tube: 10Fr (placed 9/26) UOP: x24 hours   Sodium low. Likely fluid-related. Continues on spironolactone  and torsemide . Has required potassium supplementation. Continues on MVI. No insulin  currently ordered.  Meds:  Famotidine  Melatonin  MVI w/ minerals Nystatin  Protonix  Spironolactone  Torsemide   Drips: IV KCl   Labs:  Na+ 135>134>133 (L) K+ 3.8>3.3>3.4(L) Mg 2.1 (wdl) Crt 1.53>1.47>1.40 (H) CBGs 134-228 x24 hours A1c 6.4 (08/2023)    NUTRITION - FOCUSED PHYSICAL EXAM:  Will repeat on next in-person meeting, as expect he has lost additional body weight/muscle mass.  Diet Order:   Diet Order             Diet NPO time specified Except for:  Sips with Meds, Ice Chips  Diet effective now            EDUCATION NEEDS:   No education needs have been identified at this time  Skin:  Skin Assessment: Reviewed RN Assessment  Last BM:  9/18 - type 7 x1  Height:  Ht Readings from Last 1 Encounters:   02/18/24 6' (1.829 m)   Weight:  Wt Readings from Last 1 Encounters:  02/18/24 80.7 kg   Ideal Body Weight:  80.9 kg  BMI:  Body mass index is 24.13 kg/m.  Estimated Nutritional Needs:   Kcal:  1900-2100 kcals  Protein:  95-110g  Fluid:  1.9-2.1L/day  Blair Deaner MS, RD, LDN Registered Dietitian Clinical Nutrition RD Inpatient Contact Info in Amion

## 2024-02-19 ENCOUNTER — Inpatient Hospital Stay (HOSPITAL_COMMUNITY)

## 2024-02-19 DIAGNOSIS — I4891 Unspecified atrial fibrillation: Secondary | ICD-10-CM | POA: Diagnosis not present

## 2024-02-19 DIAGNOSIS — K5981 Ogilvie syndrome: Secondary | ICD-10-CM | POA: Diagnosis not present

## 2024-02-19 DIAGNOSIS — K6389 Other specified diseases of intestine: Secondary | ICD-10-CM | POA: Diagnosis not present

## 2024-02-19 DIAGNOSIS — K560 Paralytic ileus: Secondary | ICD-10-CM | POA: Diagnosis not present

## 2024-02-19 DIAGNOSIS — I5023 Acute on chronic systolic (congestive) heart failure: Secondary | ICD-10-CM | POA: Diagnosis not present

## 2024-02-19 DIAGNOSIS — E440 Moderate protein-calorie malnutrition: Secondary | ICD-10-CM | POA: Diagnosis not present

## 2024-02-19 DIAGNOSIS — N179 Acute kidney failure, unspecified: Secondary | ICD-10-CM | POA: Diagnosis not present

## 2024-02-19 DIAGNOSIS — I4819 Other persistent atrial fibrillation: Secondary | ICD-10-CM | POA: Diagnosis not present

## 2024-02-19 DIAGNOSIS — Z66 Do not resuscitate: Secondary | ICD-10-CM

## 2024-02-19 LAB — COMPREHENSIVE METABOLIC PANEL WITH GFR
ALT: 13 U/L (ref 0–44)
AST: 13 U/L — ABNORMAL LOW (ref 15–41)
Albumin: 2.6 g/dL — ABNORMAL LOW (ref 3.5–5.0)
Alkaline Phosphatase: 69 U/L (ref 38–126)
Anion gap: 14 (ref 5–15)
BUN: 41 mg/dL — ABNORMAL HIGH (ref 8–23)
CO2: 19 mmol/L — ABNORMAL LOW (ref 22–32)
Calcium: 8.1 mg/dL — ABNORMAL LOW (ref 8.9–10.3)
Chloride: 100 mmol/L (ref 98–111)
Creatinine, Ser: 1.59 mg/dL — ABNORMAL HIGH (ref 0.61–1.24)
GFR, Estimated: 45 mL/min — ABNORMAL LOW (ref >60)
Glucose, Bld: 235 mg/dL — ABNORMAL HIGH (ref 70–99)
Potassium: 3.5 mmol/L (ref 3.5–5.1)
Sodium: 133 mmol/L — ABNORMAL LOW (ref 135–145)
Total Bilirubin: 0.5 mg/dL (ref 0.0–1.2)
Total Protein: 5.4 g/dL — ABNORMAL LOW (ref 6.5–8.1)

## 2024-02-19 LAB — CBC
HCT: 31.4 % — ABNORMAL LOW (ref 39.0–52.0)
Hemoglobin: 10.2 g/dL — ABNORMAL LOW (ref 13.0–17.0)
MCH: 28.7 pg (ref 26.0–34.0)
MCHC: 32.5 g/dL (ref 30.0–36.0)
MCV: 88.5 fL (ref 80.0–100.0)
Platelets: 623 K/uL — ABNORMAL HIGH (ref 150–400)
RBC: 3.55 MIL/uL — ABNORMAL LOW (ref 4.22–5.81)
RDW: 19.7 % — ABNORMAL HIGH (ref 11.5–15.5)
WBC: 4.5 K/uL (ref 4.0–10.5)
nRBC: 0 % (ref 0.0–0.2)

## 2024-02-19 LAB — COOXEMETRY PANEL
Carboxyhemoglobin: 1.3 % (ref 0.5–1.5)
Methemoglobin: 1.2 % (ref 0.0–1.5)
O2 Saturation: 57 %
Total hemoglobin: 10.5 g/dL — ABNORMAL LOW (ref 12.0–16.0)

## 2024-02-19 LAB — MAGNESIUM: Magnesium: 2 mg/dL (ref 1.7–2.4)

## 2024-02-19 LAB — PHOSPHORUS: Phosphorus: 4.3 mg/dL (ref 2.5–4.6)

## 2024-02-19 MED ORDER — POTASSIUM CHLORIDE CRYS ER 20 MEQ PO TBCR
40.0000 meq | EXTENDED_RELEASE_TABLET | ORAL | Status: DC
  Filled 2024-02-19: qty 2

## 2024-02-19 MED ORDER — MAGNESIUM SULFATE 2 GM/50ML IV SOLN
2.0000 g | Freq: Once | INTRAVENOUS | Status: AC
  Administered 2024-02-19: 2 g via INTRAVENOUS
  Filled 2024-02-19: qty 50

## 2024-02-19 MED ORDER — POTASSIUM CHLORIDE 10 MEQ/100ML IV SOLN
10.0000 meq | INTRAVENOUS | Status: AC
  Administered 2024-02-19 (×2): 10 meq via INTRAVENOUS
  Filled 2024-02-19 (×2): qty 100

## 2024-02-19 MED ORDER — POTASSIUM CHLORIDE 20 MEQ PO PACK
40.0000 meq | PACK | ORAL | Status: AC
  Administered 2024-02-19 (×2): 40 meq via ORAL
  Filled 2024-02-19 (×2): qty 2

## 2024-02-19 NOTE — Progress Notes (Signed)
 Rounding Note   Patient Name: Chase Combs Date of Encounter: 02/19/2024  [facility] Cardiologist: Alm Clay, MD   Subjective No CP or dyspnea; abdominal pain improved following decompression  Scheduled Meds:  apixaban   5 mg Oral BID   Chlorhexidine  Gluconate Cloth  6 each Topical Q0600   digoxin   0.0625 mg Oral Daily   famotidine   20 mg Oral Daily   feeding supplement  1 Container Oral TID BM   Gerhardt's butt cream   Topical BID   magic mouthwash  5 mL Oral QID   melatonin  5 mg Oral QHS   multivitamin with minerals  1 tablet Oral Daily   nystatin    Topical BID   pantoprazole   40 mg Oral BID   rifaximin   550 mg Oral TID   rosuvastatin   20 mg Oral Daily   sodium chloride  flush  10-40 mL Intracatheter Q12H   sodium chloride  flush  3 mL Intravenous Q12H   spironolactone   25 mg Oral Daily   ticagrelor   90 mg Oral BID   torsemide   40 mg Oral Daily   Continuous Infusions:  amiodarone  30 mg/hr (02/18/24 2352)   PRN Meds: acetaminophen , ALPRAZolam , atropine , diphenhydrAMINE , guaiFENesin -dextromethorphan , ipratropium-albuterol , metoCLOPramide  (REGLAN ) injection, mouth rinse, polyethylene glycol, simethicone , sodium chloride  flush, sodium chloride  flush   Vital Signs  Vitals:   02/18/24 1928 02/18/24 2352 02/19/24 0356 02/19/24 0500  BP: 104/76 105/77 (!) 88/63   Pulse: (!) 110 (!) 105 98   Resp: 20 20 19    Temp: 97.7 F (36.5 C) 97.6 F (36.4 C) 97.7 F (36.5 C)   TempSrc: Axillary Axillary Oral   SpO2: 95% 98% 94%   Weight:    79.4 kg  Height:        Intake/Output Summary (Last 24 hours) at 02/19/2024 0731 Last data filed at 02/19/2024 0358 Gross per 24 hour  Intake 755.96 ml  Output 1300 ml  Net -544.04 ml      02/19/2024    5:00 AM 02/18/2024   11:37 AM 02/18/2024    5:54 AM  Last 3 Weights  Weight (lbs) 175 lb 0.7 oz 177 lb 14.6 oz 177 lb 14.6 oz  Weight (kg) 79.4 kg 80.7 kg 80.7 kg      Telemetry Atrial fibrillation rate controlled-  Personally Reviewed   Physical Exam  GEN: No acute distress.   Neck: No JVD Cardiac: Irregular Respiratory: Clear to auscultation bilaterally. GI: Soft, mildly distended MS: No edem Neuro:  Nonfocal  Psych: Normal affect   Labs High Sensitivity Troponin:   Recent Labs  Lab 01/22/24 1243 01/22/24 1443  TROPONINIHS 126* 108*     Chemistry Recent Labs  Lab 02/15/24 0505 02/16/24 0515 02/17/24 0510 02/18/24 0440 02/19/24 0400  NA 133*   < > 134* 133* 133*  K 3.8   < > 3.3* 3.4* 3.5  CL 98   < > 100 97* 100  CO2 21*   < > 23 21* 19*  GLUCOSE 139*   < > 134* 228* 235*  BUN 37*   < > 41* 39* 41*  CREATININE 1.41*   < > 1.47* 1.40* 1.59*  CALCIUM  9.0   < > 8.7* 8.1* 8.1*  MG 2.2  --   --  2.1 2.0  PROT 6.0*   < > 6.2* 5.8* 5.4*  ALBUMIN 2.8*   < > 2.9* 2.7* 2.6*  AST 12*   < > 14* 13* 13*  ALT 17   < > 15  15 13  ALKPHOS 69   < > 73 68 69  BILITOT 0.6   < > 0.4 0.8 0.5  GFRNONAA 52*   < > 49* 52* 45*  ANIONGAP 14   < > 11 15 14    < > = values in this interval not displayed.    Hematology Recent Labs  Lab 02/17/24 0510 02/18/24 0440 02/19/24 0400  WBC 4.4 4.3 4.5  RBC 3.99* 3.89* 3.55*  HGB 11.2* 10.9* 10.2*  HCT 34.9* 34.2* 31.4*  MCV 87.5 87.9 88.5  MCH 28.1 28.0 28.7  MCHC 32.1 31.9 32.5  RDW 20.1* 20.3* 19.7*  PLT 698* 649* 623*    Radiology  DG Abd 2 Views Result Date: 02/18/2024 CLINICAL DATA:  Adynamic ileus EXAM: DG ABDOMEN 2V COMPARISON:  Abdominal radiograph dated 02/17/2024 FINDINGS: Slightly increased gaseous dilation of the colon. Decreased gas-filled dilation of the small bowel. No free air or pneumatosis. No abnormal radio-opaque calculi or mass effect. No acute or substantial osseous abnormality. The sacrum and coccyx are partially obscured by overlying bowel contents. Prostate fiducials project over the lower midline pelvis. IMPRESSION: Slightly increased gaseous dilation of the colon. Decreased gas-filled dilation of the small bowel.  Electronically Signed   By: Limin  Xu M.D.   On: 02/18/2024 14:58   DG C-Arm 1-60 Min-No Report Result Date: 02/18/2024 Fluoroscopy was utilized by the requesting physician.  No radiographic interpretation.   DG Abd 2 Views Result Date: 02/17/2024 EXAM: 2 VIEW XRAY OF THE ABDOMEN 02/17/2024 10:53:00 AM COMPARISON: None available. CLINICAL HISTORY: Ileus, unspecified. Patient has pain in abdomen. FINDINGS: BOWEL: Persistent dilatation of the small and large bowel loops with multiple air-fluid levels and transition point in the left lower quadrant of the abdomen at the level of the mid sigmoid colon. SOFT TISSUES: No opaque urinary calculi. BONES: No acute osseous abnormality. IMPRESSION: 1. No significant change in the appearance of diffuse colonic and small bowel dilatation compatible with ileus or functional bowel obstruction Electronically signed by: Waddell Calk MD 02/17/2024 12:01 PM EDT RP Workstation: [id number]    Patient Profile   78 y.o. male with coronary artery disease, ischemic cardiomyopathy, chronic biventricular heart failure, coronary artery disease admitted with ischemic colitis/sepsis.  Had PCI of the LAD 8/25; stent thrombosis with PCI of the LAD 9/9.  Echocardiogram September 10 showed ejection fraction 20 to 25%, moderate left ventricular hypertrophy, mild right ventricular enlargement, moderate RV dysfunction, mild to moderate tricuspid regurgitation, trace aortic insufficiency.  Initially required milrinone  which has been discontinued.  Assessment & Plan   1 coronary artery disease-patient denies chest pain following PCI of the LAD (secondary to stent thrombosis).  Continue Brilinta .  Not on aspirin  given recent GI bleed.  Continue statin.  2 atrial fibrillation with rapid ventricular response-continue IV amiodarone  and digoxin  (need to be careful with digoxin  dosing in the setting of renal insufficiency and amiodarone  use).  Continue apixaban .  Possible TEE guided  cardioversion on Monday.  3 acute on chronic heart failure reduced ejection fraction-volume status appears to be unchanged today.  Continue present dose of Demadex  and spironolactone .  Follow renal function.  Note he is not on an SGLT2 inhibitor with fungal rash.  Co. ox today is 57.  Blood pressure/renal function will not allow ARB or Entresto .  No beta-blocker in the setting of acute CHF.  4 acute kidney injury-continue to follow renal function with diuresis.  5 anemia/GI bleed-hemoglobin unchanged.  6 intestinal pneumatosis with concern for ischemic colitis/ileus followed by gastroenterology.  For questions or updates, please contact [facility] Please consult [email or web address] for contact info under    Signed, Redell Shallow, MD  02/19/2024, 7:31 AM

## 2024-02-19 NOTE — Assessment & Plan Note (Deleted)
 Made DNR/DNI on 01-29-2024 by palliative care

## 2024-02-19 NOTE — Progress Notes (Signed)
 Patient ID: Chase Combs, male   DOB: 02/21/1946, 78 y.o.   MRN: [id number]    Progress Note   Subjective   Day # 27 CC; persistent large and small bowel ileus  Status post colonoscopic decompression yesterday/successful, decompression tube left in place  Patient relates that he had multiple diarrheal bowel movements overnight, abdomen feels much better no nausea or vomiting still no appetite and complains of things tasting bad but has been taking clear liquids  KUB this a.m.-decrease distal colonic distention, gaseous distention at the hepatic flexure persists but has improved maximal distention 9 cm previously 12.4 similar gaseous small bowel distention centrally  Labs WBC 4.5/hemoglobin 10.2/hematocrit 31.4 stable Sodium 133/potassium 3.5/BUN 41/creatinine 1.59    Objective   Vital signs in last 24 hours: Temp:  [97.1 F (36.2 C)-97.9 F (36.6 C)] 97.7 F (36.5 C) (09/27 0356) Pulse Rate:  [90-119] 119 (09/27 0848) Resp:  [17-40] 19 (09/27 0356) BP: (88-114)/(59-90) 88/63 (09/27 0356) SpO2:  [90 %-100 %] 95 % (09/27 0900) Weight:  [79.4 kg-80.7 kg] 79.4 kg (09/27 0500) Last BM Date : 02/19/24 General:   Elderly white male in NAD Heart:  Regular rate and rhythm; no murmurs Lungs: Respirations even and unlabored, lungs CTA bilaterally Abdomen: Much softer, no current tympany, bowel sounds are more active, no focal tenderness, decompression tube in place, no stool in bag currently Extremities:  Without edema. Neurologic:  Alert and oriented,  grossly normal neurologically. Psych:  Cooperative. Normal mood and affect.  Intake/Output from previous day: 09/26 0701 - 09/27 0700 In: 807.1 [P.O.:120; I.V.:487.1; IV Piggyback:200] Out: 1300 [Urine:1300] Intake/Output this shift: No intake/output data recorded.  Lab Results: Recent Labs    02/17/24 0510 02/18/24 0440 02/19/24 0400  WBC 4.4 4.3 4.5  HGB 11.2* 10.9* 10.2*  HCT 34.9* 34.2* 31.4*  PLT 698* 649* 623*    BMET Recent Labs    02/17/24 0510 02/18/24 0440 02/19/24 0400  NA 134* 133* 133*  K 3.3* 3.4* 3.5  CL 100 97* 100  CO2 23 21* 19*  GLUCOSE 134* 228* 235*  BUN 41* 39* 41*  CREATININE 1.47* 1.40* 1.59*  CALCIUM  8.7* 8.1* 8.1*   LFT Recent Labs    02/19/24 0400  PROT 5.4*  ALBUMIN 2.6*  AST 13*  ALT 13  ALKPHOS 69  BILITOT 0.5   PT/INR No results for input(s): LABPROT, INR in the last 72 hours.  Studies/Results: DG Abd 2 Views Result Date: 02/18/2024 CLINICAL DATA:  Adynamic ileus EXAM: DG ABDOMEN 2V COMPARISON:  Abdominal radiograph dated 02/17/2024 FINDINGS: Slightly increased gaseous dilation of the colon. Decreased gas-filled dilation of the small bowel. No free air or pneumatosis. No abnormal radio-opaque calculi or mass effect. No acute or substantial osseous abnormality. The sacrum and coccyx are partially obscured by overlying bowel contents. Prostate fiducials project over the lower midline pelvis. IMPRESSION: Slightly increased gaseous dilation of the colon. Decreased gas-filled dilation of the small bowel. Electronically Signed   By: Limin  Xu M.D.   On: 02/18/2024 14:58   DG C-Arm 1-60 Min-No Report Result Date: 02/18/2024 Fluoroscopy was utilized by the requesting physician.  No radiographic interpretation.       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  Current hospitalization complicated by probable right-sided ischemic colitis and transient lower GI bleeding in setting of Brilinta /aspirin /Eliquis .  Patient subsequently developed a small  and large bowel ileus/pseudo obstruction which has been refractory. No response to subcu neostigmine  earlier this week and very minimal response to IV neostigmine  which also caused other adverse side effects. Patient underwent colonoscopic decompression yesterday successfully with  significant improvement in his abdominal distention and discomfort. Abdominal films today improved  Patient feeling much better, has been stooling liquid stool all night which is appropriate  #2 possible SIBO completing 14 days of Xifaxan  #3 mild hypokalemia continue replacement try to keep potassium closer to 4 #4 malnutrition secondary to acute illness and poor oral intake Continue clear liquids today at patient's preference with protein supplements 2-3 times daily  Plan; continue current regimen from GI perspective.  Expect he will have ongoing loose stools over the next couple of days, bowel needs to evacuate to continue to decompress.  Believe decompression tube in place this will gradually come out. Continue clear liquids for now at patient preference encourage protein supplements 2-3 times daily Any ability to move, turn, ambulate or be up in chair will be helpful Continue to replace potassium GI will follow with you      Active Problems:   Coronary artery disease involving native coronary artery of native heart without angina pectoris   Essential hypertension   Hypokalemia   Acute on chronic systolic CHF (congestive heart failure) (HCC)   Malnutrition of moderate degree   Colonic pseudoobstruction   Small intestinal bacterial overgrowth (SIBO)   Atrial fibrillation with RVR (HCC)   Pressure injury of skin   AKI (acute kidney injury)   Type 2 diabetes mellitus with hyperlipidemia (HCC)   Generalized abdominal pain   Abnormal x-ray of abdomen   DNR (do not resuscitate)     LOS: 28 days   Evey Mcmahan EsterwoodPA-C  02/19/2024, 11:04 AM

## 2024-02-19 NOTE — Progress Notes (Signed)
 PROGRESS NOTE    Chase Combs  FMW:[id number] DOB: 1945-07-22 DOA: 01/22/2024 PCP: Wonda Worth SQUIBB, PA  Subjective: Pt seen and examined. Had decompression colonoscopy yesterday with colonic decompression tube placed. Pt feeling better. Had multiple liquid Bms last night. Pt states he had 10 liquid BM around the tube in his anus. Able to tolerate liquids.  HR improved as well. Still in afib but HR down to low 100s.   Hospital Course: CC: weakness, emesis HPI: 78 year old man w/ hx of ischemic cardiomyopathy and very recent LAD stent placement for NSTEMI discharged 8/27 (no d/c summary so unclear) with progressive nausea vomiting with CT images concerning for ischemic colitis.   Feeling weak and unwell since discharge 3 days ago.  Progressive nausea and vomiting not keeping down his antiplatelet medicines.  CT images reveal intestinal air in the wall of the intestine as well as concerning findings of ileus.  His lactate was elevated.  He was volume resuscitated.  Remained hypotensive.  Placed on broad-spectrum antibiotics he received vancomycin  Zosyn  doxycycline and Flagyl .  Labs notable for acute renal failure.  Anion gap metabolic acidosis.  Leukopenia, lymphopenia.  Significant Events: Admitted 01/22/2024 to ICU by PCCM due to intestinal pneumatosis concerning for ischemic colitis 01-24-2024 to 01-27-2024 eneral surgery recommended conservative management.   Concern patient not able to absorb antiplatelet therapy, he was placed on IV cangrelor .  Patient continue to be hypotensive despite IV fluids and was placed on norepinephrine  infusion, consistent with septic shock due to abdominal source/ ischemic colitis.  during hospitalization required PRBC transfusion due to blood loss anemia.   There was concerns of in-stent thrombosis from recent PCI therefore antiplatelets were switched from Cangrelor  to Plavix  and started on milrinone  drip due to cardiogenic shock.   Developed atrial fibrillation  with rapid ventricular response, placed on IV amiodarone .  Transition to Aggrastat for anticoagulation.  09/05 GI consulted, clinical picture consistent with colonic ileus, Ogilvie syndrome.  09/08 PRBC transfusion x1, transitioned to clopidogrel .  09/09 chest pain with new antero lateral ST elevations. Underwent urgent cardiac catheterization, LAD with ostially occluded stent with acute stent thrombosis (Culprit vessel). Successful percutaneous coronary intervention ostial proximal LAD, PTCA with balloon dilatation.  Significant volume overloaded with low SV02 and CVP very high, placed on IV furosemide  and milrinone .  09/10 completed antibiotic course.  09/10 EGD with esophageal ulcer with no bleeding and no stigmata of recent bleeding.  LA grade C reflux esophagitis with no bleeding.  Duodenal erosions without bleeding.  Flexible sigmoidoscopy positive blood in the rectum, in the sigmoid colon, in the descending colon and in the transverse colon. No obvious source of bleed.  Diverticulosis in the sigmoid colon and in the descending colon. No active bleeding.  A single superficial ulcer in the rectum. No active bleeding.   09/12 transition to ticagrelor .  Slowly weaned off TPN, diet as tolerated.   09/19 continue colonic pseudo obstruction, imaging with progression of colonic distention. Started on rifaximin .  Pending cardioversion until bowel function improves.  09/21 pending direct current cardioversion.  09/22 CT abdomen and pelvis dilated loops of small bowel and colon with air fluid levels in the distal colon and rectum. No obstruction. Appearance consistent with ileus or functional bowel disturbance.  09/23 trial of neostigmine   02-18-2024 persistent colonic pseudo obstruction despite several days of SQ neostigmine  and 1 dose of IV neostigmine . All without improvement in his symptoms. Abd XR continue to show dilated colon.  Pt taken for decompression colonoscopy with  colonic decompression tube  placed  Admission Labs: Na 132, K 4.5, CO2 of 31, BUN 64, scr 2.55, glu 197 T prot 6.2, alb 3.0, AST 29, ALT 31, alk pho 64, t. Bili 1.4 WBC 2.4, HgB 13.9, plt 489 BNP 220 Procalcitonin 1.23  Admission Imaging Studies: CXR Low lung volumes with bibasilar linear and patchy opacities. 2. Small stable bilateral pleural effusion. 3. Elevated right hemidiaphragm. CT chest/abd/pelvis Significantly improved multifocal, bilateral pneumonia since 01/14/24. Persistent airspace and ground glass opacities in the right lower lobe, left lower lobe, and posterior left upper lobe/lingula, likely residual pneumonia or aspiration. 2. Gas along the wall of the ascending colon and cecum, suspicious for pneumatosis. Findings called to PA [place] at 3:19 pm. 3. Fluid-filled colon suggests a diarrheal state. Diffuse colonic and small bowel distention likely represent Ileus. 4. Increased small volume pelvic and new right-sided pericolonic trace fluid, likely secondary to the colonic proces. 5. Incidental findings, including: Pulmonary artery enlargement suggests pulmonary arterial hypertension. Aortic and coronary artery atherosclerosis. Right nephrolithiasis. Suspicion of minimal chronic calcific pancreatitis.  Significant Labs:   Significant Imaging Studies: 01-30-2024 CTA GI bleed Severe narrowing is noted at origin of celiac artery with poststenotic dilatation. No definite evidence of contrast extravasation to suggest gastrointestinal bleeding. Mild wall and fold thickening of ascending colon is noted suggesting possible infectious or inflammatory colitis 02-02-2024 echo LVEF 20-25%. eft ventricle has severely decreased function  02-14-2024 CT abd/pelvis Dilated loops of small bowel and colon with air-fluid levels in the distal colon and rectum indicating diarrheal process. No lead point for obstruction is identified. No pneumatosis or free air. Presumably the appearance is due to ileus or functional bowel  disturbance. 2. 0.3 cm right kidney lower pole nonobstructive stone. 3. Multilevel lumbar degenerative facet arthropathy with multilevel foraminal impingement. 4. Umbilical hernia contains adipose tissue. 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 5. High density in the gallbladder probably from sludge. 6. Mild bibasilar atelectasis. 7. Aortic Atherosclerosis (ICD10-I70.0). Coronary atherosclerosis. Prominent cardiomegaly.  Antibiotic Therapy: Anti-infectives (From admission, onward)    Start     Dose/Rate Route Frequency Ordered Stop   02/11/24 1000  rifaximin  (XIFAXAN ) tablet 550 mg        550 mg Oral 3 times daily 02/11/24 0908     02/01/24 1645  cefTRIAXone  (ROCEPHIN ) 2 g in sodium chloride  0.9 % 100 mL IVPB  Status:  Discontinued        2 g 200 mL/hr over 30 Minutes Intravenous Every 24 hours 02/01/24 1553 02/03/24 1029   01/29/24 2200  metroNIDAZOLE  (FLAGYL ) tablet 500 mg  Status:  Discontinued        500 mg Oral Every 12 hours 01/29/24 1627 02/03/24 1029   01/22/24 2200  piperacillin -tazobactam (ZOSYN ) IVPB 3.375 g        3.375 g 12.5 mL/hr over 240 Minutes Intravenous Every 8 hours 01/22/24 1602 01/28/24 2359   01/22/24 1800  metroNIDAZOLE  (FLAGYL ) IVPB 500 mg  Status:  Discontinued        500 mg 100 mL/hr over 60 Minutes Intravenous Every 8 hours 01/22/24 1653 01/23/24 0857   01/22/24 1545  metroNIDAZOLE  (FLAGYL ) IVPB 500 mg  Status:  Discontinued        500 mg 100 mL/hr over 60 Minutes Intravenous  Once 01/22/24 1533 01/22/24 1634   01/22/24 1330  doxycycline (VIBRAMYCIN) 100 mg in sodium chloride  0.9 % 250 mL IVPB  Status:  Discontinued        100 mg 125 mL/hr over 120  Minutes Intravenous  Once 01/22/24 1320 01/22/24 1649   01/22/24 1315  azithromycin (ZITHROMAX) 500 mg in sodium chloride  0.9 % 250 mL IVPB  Status:  Discontinued        500 mg 250 mL/hr over 60 Minutes Intravenous  Once 01/22/24 1309 01/22/24 1319   01/22/24 1315  ceFEPIme  (MAXIPIME ) 2 g in  sodium chloride  0.9 % 100 mL IVPB        2 g 200 mL/hr over 30 Minutes Intravenous  Once 01/22/24 1309 01/22/24 1515   01/22/24 1315  vancomycin  (VANCOREADY) IVPB 1750 mg/350 mL  Status:  Discontinued        1,750 mg 175 mL/hr over 120 Minutes Intravenous  Once 01/22/24 1311 01/22/24 1756       Procedures: 02-01-2024 LHC LM: Distal 45% stenosis,  LAD: Ostially occluded stent with acute stent thrombosis (Culprit vessel),  [place]: 50% in-stent restenosis,  Lcx: Mild diffuse disease,  RCA: Not engaged today. LVEDP 27 mmHg.  Successful percutaneous coronary intervention ostial prox LAD.  PTCA with serial balloon dilatation, up to 3.0  X 12 mm [place] balloon up to 20 atm 02-18-2024 endoscopic colonic decompression with tube placement  Consultants: PCCM Cardiology General surgery    Assessment and Plan: * Pneumatosis intestinalis of large intestine-resolved as of 02/16/2024 01-22-2024 to 01-29-2024.  admitted to ICU by PCCM. General surgery consulted. CT abd showed pneumatosis. Surgery did not feel that pt was a operative candidate. IV abx started. Pt developed gastric distension and ileus.  Due to N/V and ileus, NG tube was placed for gastric decompression. By 01-26-2024, abd distension improved, NG tube removed and clear liquid diet started.   Atrial fibrillation with RVR (HCC) 02-04-2024 pt started on digoxin  and IV amiodarone  for rate control as pt's blood pressure too low to start any other rate controlled meds due to cardiogenic shock.  02-05-2024 through 02-15-2024 afib rate remains uncontrolled. Due to ongoing colonic ileus, cardiology does not want to cardiovert.  02-16-2024 remains on IV amiodarone  gtts. Rate still in the mid 100s. SBP borderline low at 90-100s  02/17/24 pt remains on IV amiodarone  and eliquis . HR still uncontrolled. Cardiology managing his rapid afib.  02/18/24 on digoxin .  remains on IV amiodarone  and eliquis . Cards managing afib. Potential for DCCV after colonic issues  resolved.  02/19/24 remains on IV amiodarone  gtts and eliquis . Cards thinking about possible DCCV on Monday. Attempt to keep serum K >4.0 and Mg > 2.0   Colonic pseudoobstruction 01-27-2025 GI consulted due to colonic ileus that was not responding to conservative measures. Pt has persistent colonic distension.  GI recommended to continue Flexiseal and to treat empiric treatment with flagyl  for presumed SIBO(small intestine bacterial overgrowth).  02-01-2024 pt develops rectal bleeding and drops his HgB from 9.2 g/dl to 7.3 g/dl.  02-02-2024 pt taken for flex sigmoidoscopy and EGD that shows bright red blood in the colon from the rectum to the transverse colon underlying mucosa appeared normal few diverticuli noted in the left and sigmoid no active bleeding, single nonbleeding superficial ulcer in the rectum.  EGD showed superficial esophageal ulcer with no bleeding, grade C esophagitis with no bleeding and a few erosions in the first portion of the duodenum  02-03-2024 to 02-10-2024 persistent abd distension despite liquid bowel movements.    02-11-2024 Xifaxan  started for additional SIBO treatment due to persistent abdominal distension.   02-15-2024 after 72 hours of no improvement with treatment with Xifaxan  for SIBO, pt started on SQ neostigmine  with IV robinul  to  help with colonic decompression.  02-16-2024 GI continuing with SQ neostigmine  q6h.  02-17-2024 GI managing colonic ileus. Abd XR ordered. Pt may need endoscopic decompression. GI has made pt NPO.  02/18/24 Pt states he has not had any BM overnight. Appears he was given 1 mg IV neostigmine  instead of SQ injection. He states he was extremely nauseated with IV neostigmine . Today, Abd XR still show extremely dilated colon. XR no changes from yesterday.  02-19-2024 s/p colonoscopy with decompression tube placed yesterday. Neostigmine  stopped on 02-18-2024.   AKI (acute kidney injury) 01-22-2024 through 01-28-2024. Admitted with AKI  with Scr of 2.55. with IVF and treatment of his ileus/intestinal pneumatosis, his scr return to 1.07 by 01-28-2024  02-08-2024 through 02-11-2024 he develops another AKI during his cardiogenic shock. By 02-10-2024, scr back to 1.06  02-13-2024 scr back on the rise again after uncontrolled afib and starting of aldcatone/demadex .  02/17/24 Scr stable at 1.4 - 1.5 on demadex  and aldactone .  02/18/24 Scr stable at 1.4  02/19/24 Scr up to 1.59. cards wants to continue demadex /aldactone  despite rising Scr.     Small intestinal bacterial overgrowth (SIBO) 01-27-2025 GI consulted due to colonic ileus that was not responding to conservative measures. Pt has persistent colonic distension.  GI recommended to continue Flexiseal and to treat empiric treatment with flagyl  for presumed SIBO(small intestine bacterial overgrowth).   02-03-2024 to 02-10-2024 persistent abd distension despite liquid bowel movements.     02-11-2024 Xifaxan  started for additional SIBO treatment due to persistent abdominal distension.    02-15-2024 after 72 hours of no improvement with treatment with Xifaxan  for SIBO, pt started on IV neostigmine  with IV robinul  to help with colonic decompression.   02-16-2024 GI continuing with SQ neostigmine  q6h.  02/17/24 remains on Xifaxan  TID. GI treating him for 14 days. End date 02-24-2024. During prior admission, flagyl  seemed to work but no longer.  02/18/24 continues with Xifaxan  TID. End date of therapy 02-24-2024.  02/19/24 continues on Xifaxan . End date of therapy 02-24-2024. This will completed 14 days of treatment.    Acute on chronic systolic CHF (congestive heart failure) (HCC) 02-02-2024 pt develops cardiogenic shock after STEMI requiring PTCA. Pt placed on milrinone  for cardiovascular support.  02-04-2024 through 02-09-2024 pt started on IV lasix  gtts for diuresis while he has cardiogenic shock to help remove intravascular volume  02-14-2024 po demadex  and aldactone   started.  02/17/24 stable on po demadex  and aldactone . Scr 1.4-1.5 over the last 4-5 days.  02/18/24 stable. On demadex  and aldactone .  02/19/24 Scr up to 1.59. cards wants to continue demadex /aldactone  despite rising Scr.   Hypokalemia 02/18/24 K 3.4 today. Given rapid afib and NPO status. Will give IV Kcl replacement.  02/19/24 K 3.5 today. Will give more KCL due to afib.   Cardiogenic shock (HCC)-resolved as of 02/16/2024 02-01-2024 after his STEMI from in-stent restenosis requiring PCTA on 02-01-2024. Pt develops cardiogenic shock requiring IV milrinone  and transfer to CICU.  02-02-2024 to 02-09-2024 echo shows LVEF dropped to 20-25%. Pt kept on milrinone  until 02-09-2024 when it was eventually weaned off.  Anemia due to acute blood loss-resolved as of 02/16/2024 GI bleed.   SP 4 units PRBC transfusion.   EGD with flexible sigmoidoscopy with red blood in the colon.  Grade 1 esophagitis.  Continue pantoprazole .  Follow up hgb is 11.3   Acute blood loss anemia-resolved as of 02/16/2024 02-01-2024 pt develops rectal bleeding and drops his HgB from 9.2 g/dl to 7.3 g/dl.  02-02-2024 pt transfused  2 units PRBC.  pt taken for flex sigmoidoscopy and EGD that shows bright red blood in the colon from the rectum to the transverse colon underlying mucosa appeared normal few diverticuli noted in the left and sigmoid no active bleeding, single nonbleeding superficial ulcer in the rectum.  EGD showed superficial esophageal ulcer with no bleeding, grade C esophagitis with no bleeding and a few erosions in the first portion of the duodenum  Gastrointestinal hemorrhage-resolved as of 02/16/2024 02-01-2024 pt develops rectal bleeding and drops his HgB from 9.2 g/dl to 7.3 g/dl.   02-02-2024 pt taken for flex sigmoidoscopy and EGD that shows bright red blood in the colon from the rectum to the transverse colon underlying mucosa appeared normal few diverticuli noted in the left and sigmoid no active  bleeding, single nonbleeding superficial ulcer in the rectum.  EGD showed superficial esophageal ulcer with no bleeding, grade C esophagitis with no bleeding and a few erosions in the first portion of the duodenum  Type 2 diabetes mellitus with hyperlipidemia (HCC) 02-16-2024 CBG stable without SSI  02/17/24 CBG stable  02/18/24 stable CBG  02/19/24 CBG stable.   Pressure injury of skin Wound 02/01/24 1600 Pressure Injury Buttocks Stage 2 -  Partial thickness loss of dermis presenting as a shallow open injury with a red, pink wound bed without slough. (Active)    Malnutrition of moderate degree Nutrition Status: Nutrition Problem: Moderate Malnutrition Etiology: acute illness Signs/Symptoms: energy intake < 75% for > 7 days, moderate muscle depletion, mild muscle depletion Interventions: Boost Breeze, MVI, Refer to RD note for recommendations    Essential hypertension 02-17-2024 unable to start HTN meds due to low BP  02/18/24 BP still borderline low due to rapid afib.  02/19/24 BP low today. Cannot be on any further HTN meds due to low BP.   Coronary artery disease involving native coronary artery of native heart without angina pectoris 09-09--2025 pt has in-stent thrombosis causing STEMI. Taken to cath lab for [facility] to PTCA.  02-16-2024 pt on Brilinta . ASA on hold due to GI bleed on 02-02-2024.  Restart crestor  20 mg daily.  02/17/24 remains on Brilinta  and Eliquis . No ASA due to GI bleeding on 02-02-2024.  02/18/24 stable on Eliquis  and Brilinta . Cards holding on ASA due to GI bleeding on 02-02-2024  02/19/24 on Eliquis  and Brilinta . ASA on hold due to GI bleeding on 02-02-2024.    ST elevation myocardial infarction involving left main coronary artery (HCC)-resolved as of 02/16/2024 02-01-2024 At around 1:15 PM, patient started having right shoulder pain and started feeling diaphoretic. EKG showed new anterolateral ST elevation. Cardiology felt that pt likely had occluded  his recently placed LAD stent with several days of likely suboptimal platelet inhibition. Pt taken emergently to cath lab where LHC showed LAD: Ostially occluded stent with acute stent thrombosis. Pt subsequently develops cardiogenic shock and requires IV milrinone  for 7 days.   DNR (do not resuscitate) Made DNR/DNI on 01-29-2024 by palliative care    DVT prophylaxis: Place and maintain sequential compression device Start: 02/04/24 1132 Place TED hose Start: 02/04/24 0941 SCD's Start: 02/01/24 1555 apixaban  (ELIQUIS ) tablet 5 mg     Code Status: Do not attempt resuscitation (DNR) PRE-ARREST INTERVENTIONS DESIRED Family Communication: no family at bedside. Spoke with wife over phone yesterday Disposition Plan: unknown Reason for continuing need for hospitalization: remains on IV amiodarone  gtts. Has colonic decompression tube.  Objective: Vitals:   02/19/24 0356 02/19/24 0500 02/19/24 0848 02/19/24 0900  BP: (!) 88/63  Pulse: 98  (!) 119   Resp: 19     Temp: 97.7 F (36.5 C)     TempSrc: Oral     SpO2: 94%   95%  Weight:  79.4 kg    Height:        Intake/Output Summary (Last 24 hours) at 02/19/2024 1035 Last data filed at 02/19/2024 0358 Gross per 24 hour  Intake 755.96 ml  Output 1300 ml  Net -544.04 ml   Filed Weights   02/18/24 0554 02/18/24 1137 02/19/24 0500  Weight: 80.7 kg 80.7 kg 79.4 kg    Examination:  Physical Exam Vitals and nursing note reviewed.  Constitutional:      General: He is not in acute distress.    Appearance: He is not toxic-appearing.  HENT:     Head: Normocephalic.  Eyes:     General: No scleral icterus. Cardiovascular:     Rate and Rhythm: Tachycardia present. Rhythm irregular.  Pulmonary:     Effort: Pulmonary effort is normal.     Breath sounds: Normal breath sounds.  Abdominal:     Tenderness: There is no abdominal tenderness.     Comments: Abd much less distended. Soft today. Much better bowel sounds today. No longer high  pitched tinkling sounds   Musculoskeletal:     Right lower leg: Edema present.     Left lower leg: Edema present.     Comments: Trace to +1 pitting LE edema  Skin:    General: Skin is warm and dry.     Capillary Refill: Capillary refill takes less than 2 seconds.  Neurological:     Mental Status: He is alert and oriented to person, place, and time.     Data Reviewed: I have personally reviewed following labs and imaging studies  CBC: Recent Labs  Lab 02/15/24 0505 02/16/24 0515 02/17/24 0510 02/18/24 0440 02/19/24 0400  WBC 3.3* 2.9* 4.4 4.3 4.5  HGB 10.7* 10.8* 11.2* 10.9* 10.2*  HCT 33.9* 34.3* 34.9* 34.2* 31.4*  MCV 89.0 88.9 87.5 87.9 88.5  PLT 706* 694* 698* 649* 623*   Basic Metabolic Panel: Recent Labs  Lab 02/13/24 0445 02/14/24 0420 02/15/24 0505 02/16/24 0515 02/17/24 0510 02/18/24 0440 02/19/24 0400  NA 136 135 133* 135 134* 133* 133*  K 4.1 4.2 3.8 3.8 3.3* 3.4* 3.5  CL 104 103 98 102 100 97* 100  CO2 22 22 21* 21* 23 21* 19*  GLUCOSE 141* 144* 139* 110* 134* 228* 235*  BUN 28* 30* 37* 44* 41* 39* 41*  CREATININE 1.43* 1.45* 1.41* 1.53* 1.47* 1.40* 1.59*  CALCIUM  8.8* 9.2 9.0 8.6* 8.7* 8.1* 8.1*  MG 2.2 2.4 2.2  --   --  2.1 2.0  PHOS  --   --   --   --   --   --  4.3   GFR: Estimated Creatinine Clearance: 43.4 mL/min (A) (by C-G formula based on SCr of 1.59 mg/dL (H)). Liver Function Tests: Recent Labs  Lab 02/15/24 0505 02/16/24 0515 02/17/24 0510 02/18/24 0440 02/19/24 0400  AST 12* 14* 14* 13* 13*  ALT 17 14 15 15 13   ALKPHOS 69 68 73 68 69  BILITOT 0.6 0.6 0.4 0.8 0.5  PROT 6.0* 5.8* 6.2* 5.8* 5.4*  ALBUMIN 2.8* 2.7* 2.9* 2.7* 2.6*   BNP (last 3 results) Recent Labs    01/11/24 0539 01/13/24 0537 01/22/24 1243  BNP 842.8* 1,747.3* 220.4*   CBG: Recent Labs  Lab 02/15/24 1559 02/15/24 2100 02/16/24 0601  02/16/24 2058 02/18/24 2122  GLUCAP 116* 131* 116* 166* 148*   Radiology Studies: DG Abd 2 Views Result Date:  02/18/2024 CLINICAL DATA:  Adynamic ileus EXAM: DG ABDOMEN 2V COMPARISON:  Abdominal radiograph dated 02/17/2024 FINDINGS: Slightly increased gaseous dilation of the colon. Decreased gas-filled dilation of the small bowel. No free air or pneumatosis. No abnormal radio-opaque calculi or mass effect. No acute or substantial osseous abnormality. The sacrum and coccyx are partially obscured by overlying bowel contents. Prostate fiducials project over the lower midline pelvis. IMPRESSION: Slightly increased gaseous dilation of the colon. Decreased gas-filled dilation of the small bowel. Electronically Signed   By: Limin  Xu M.D.   On: 02/18/2024 14:58   DG C-Arm 1-60 Min-No Report Result Date: 02/18/2024 Fluoroscopy was utilized by the requesting physician.  No radiographic interpretation.   DG Abd 2 Views Result Date: 02/17/2024 EXAM: 2 VIEW XRAY OF THE ABDOMEN 02/17/2024 10:53:00 AM COMPARISON: None available. CLINICAL HISTORY: Ileus, unspecified. Patient has pain in abdomen. FINDINGS: BOWEL: Persistent dilatation of the small and large bowel loops with multiple air-fluid levels and transition point in the left lower quadrant of the abdomen at the level of the mid sigmoid colon. SOFT TISSUES: No opaque urinary calculi. BONES: No acute osseous abnormality. IMPRESSION: 1. No significant change in the appearance of diffuse colonic and small bowel dilatation compatible with ileus or functional bowel obstruction Electronically signed by: Lybrand Stroud MD 02/17/2024 12:01 PM EDT RP Workstation: [id number]    Scheduled Meds:  apixaban   5 mg Oral BID   Chlorhexidine  Gluconate Cloth  6 each Topical Q0600   digoxin   0.0625 mg Oral Daily   famotidine   20 mg Oral Daily   feeding supplement  1 Container Oral TID BM   Gerhardt's butt cream   Topical BID   magic mouthwash  5 mL Oral QID   melatonin  5 mg Oral QHS   multivitamin with minerals  1 tablet Oral Daily   nystatin    Topical BID   pantoprazole   40 mg Oral  BID   potassium chloride   40 mEq Oral Q4H   rifaximin   550 mg Oral TID   rosuvastatin   20 mg Oral Daily   sodium chloride  flush  10-40 mL Intracatheter Q12H   sodium chloride  flush  3 mL Intravenous Q12H   spironolactone   25 mg Oral Daily   ticagrelor   90 mg Oral BID   torsemide   40 mg Oral Daily   Continuous Infusions:  amiodarone  30 mg/hr (02/18/24 2352)   magnesium  sulfate bolus IVPB     potassium chloride        LOS: 28 days   Time spent: 60 minutes  Camellia Door, DO  [facility]  02/19/2024, 10:35 AM

## 2024-02-20 ENCOUNTER — Inpatient Hospital Stay (HOSPITAL_COMMUNITY)

## 2024-02-20 DIAGNOSIS — I5023 Acute on chronic systolic (congestive) heart failure: Secondary | ICD-10-CM | POA: Diagnosis not present

## 2024-02-20 DIAGNOSIS — K5981 Ogilvie syndrome: Secondary | ICD-10-CM | POA: Diagnosis not present

## 2024-02-20 DIAGNOSIS — K567 Ileus, unspecified: Secondary | ICD-10-CM | POA: Diagnosis not present

## 2024-02-20 DIAGNOSIS — K6389 Other specified diseases of intestine: Secondary | ICD-10-CM | POA: Diagnosis not present

## 2024-02-20 DIAGNOSIS — I4819 Other persistent atrial fibrillation: Secondary | ICD-10-CM | POA: Diagnosis not present

## 2024-02-20 DIAGNOSIS — I4891 Unspecified atrial fibrillation: Secondary | ICD-10-CM | POA: Diagnosis not present

## 2024-02-20 DIAGNOSIS — Z4682 Encounter for fitting and adjustment of non-vascular catheter: Secondary | ICD-10-CM | POA: Diagnosis not present

## 2024-02-20 DIAGNOSIS — N179 Acute kidney failure, unspecified: Secondary | ICD-10-CM | POA: Diagnosis not present

## 2024-02-20 LAB — CBC
HCT: 33.8 % — ABNORMAL LOW (ref 39.0–52.0)
Hemoglobin: 10.7 g/dL — ABNORMAL LOW (ref 13.0–17.0)
MCH: 28.2 pg (ref 26.0–34.0)
MCHC: 31.7 g/dL (ref 30.0–36.0)
MCV: 88.9 fL (ref 80.0–100.0)
Platelets: 567 K/uL — ABNORMAL HIGH (ref 150–400)
RBC: 3.8 MIL/uL — ABNORMAL LOW (ref 4.22–5.81)
RDW: 19.8 % — ABNORMAL HIGH (ref 11.5–15.5)
WBC: 6.5 K/uL (ref 4.0–10.5)
nRBC: 0 % (ref 0.0–0.2)

## 2024-02-20 LAB — COMPREHENSIVE METABOLIC PANEL WITH GFR
ALT: 13 U/L (ref 0–44)
AST: 12 U/L — ABNORMAL LOW (ref 15–41)
Albumin: 2.6 g/dL — ABNORMAL LOW (ref 3.5–5.0)
Alkaline Phosphatase: 64 U/L (ref 38–126)
Anion gap: 12 (ref 5–15)
BUN: 34 mg/dL — ABNORMAL HIGH (ref 8–23)
CO2: 19 mmol/L — ABNORMAL LOW (ref 22–32)
Calcium: 8.1 mg/dL — ABNORMAL LOW (ref 8.9–10.3)
Chloride: 102 mmol/L (ref 98–111)
Creatinine, Ser: 1.39 mg/dL — ABNORMAL HIGH (ref 0.61–1.24)
GFR, Estimated: 53 mL/min — ABNORMAL LOW (ref >60)
Glucose, Bld: 197 mg/dL — ABNORMAL HIGH (ref 70–99)
Potassium: 3.3 mmol/L — ABNORMAL LOW (ref 3.5–5.1)
Sodium: 133 mmol/L — ABNORMAL LOW (ref 135–145)
Total Bilirubin: 0.6 mg/dL (ref 0.0–1.2)
Total Protein: 5.6 g/dL — ABNORMAL LOW (ref 6.5–8.1)

## 2024-02-20 LAB — COOXEMETRY PANEL
Carboxyhemoglobin: 0.6 % (ref 0.5–1.5)
Methemoglobin: <0.7 % (ref 0.0–1.5)
O2 Saturation: 58.7 %
Total hemoglobin: 10.8 g/dL — ABNORMAL LOW (ref 12.0–16.0)

## 2024-02-20 LAB — MAGNESIUM: Magnesium: 2.2 mg/dL (ref 1.7–2.4)

## 2024-02-20 LAB — HELIX PHARMACOGENOMICS (PGX) CLOPIDOGREL TEST

## 2024-02-20 MED ORDER — POTASSIUM CHLORIDE CRYS ER 20 MEQ PO TBCR
40.0000 meq | EXTENDED_RELEASE_TABLET | Freq: Two times a day (BID) | ORAL | Status: DC
  Filled 2024-02-20: qty 2

## 2024-02-20 MED ORDER — POTASSIUM CHLORIDE 20 MEQ PO PACK
40.0000 meq | PACK | ORAL | Status: AC
  Administered 2024-02-20 (×2): 40 meq via ORAL
  Filled 2024-02-20 (×2): qty 2

## 2024-02-20 MED ORDER — POTASSIUM CHLORIDE CRYS ER 20 MEQ PO TBCR
40.0000 meq | EXTENDED_RELEASE_TABLET | ORAL | Status: DC
Start: 2024-02-20 — End: 2024-02-20
  Filled 2024-02-20: qty 2

## 2024-02-20 MED ORDER — POTASSIUM CHLORIDE 10 MEQ/100ML IV SOLN
10.0000 meq | INTRAVENOUS | Status: AC
  Administered 2024-02-20 (×4): 10 meq via INTRAVENOUS
  Filled 2024-02-20 (×4): qty 100

## 2024-02-20 MED ORDER — POTASSIUM CHLORIDE 20 MEQ PO PACK
40.0000 meq | PACK | Freq: Two times a day (BID) | ORAL | Status: DC
  Administered 2024-02-21 – 2024-02-22 (×5): 40 meq via ORAL
  Filled 2024-02-20 (×5): qty 2

## 2024-02-20 NOTE — Plan of Care (Signed)

## 2024-02-20 NOTE — Progress Notes (Signed)
 Rounding Note   Patient Name: Chase Combs Date of Encounter: 02/20/2024  [facility] Cardiologist: Alm Clay, MD   Subjective Pt denies CP or dyspnea  Scheduled Meds:  apixaban   5 mg Oral BID   Chlorhexidine  Gluconate Cloth  6 each Topical Q0600   digoxin   0.0625 mg Oral Daily   famotidine   20 mg Oral Daily   feeding supplement  1 Container Oral TID BM   Gerhardt's butt cream   Topical BID   magic mouthwash  5 mL Oral QID   melatonin  5 mg Oral QHS   multivitamin with minerals  1 tablet Oral Daily   nystatin    Topical BID   pantoprazole   40 mg Oral BID   rifaximin   550 mg Oral TID   rosuvastatin   20 mg Oral Daily   sodium chloride  flush  10-40 mL Intracatheter Q12H   sodium chloride  flush  3 mL Intravenous Q12H   spironolactone   25 mg Oral Daily   ticagrelor   90 mg Oral BID   torsemide   40 mg Oral Daily   Continuous Infusions:  amiodarone  30 mg/hr (02/20/24 0000)   PRN Meds: acetaminophen , ALPRAZolam , atropine , diphenhydrAMINE , guaiFENesin -dextromethorphan , ipratropium-albuterol , metoCLOPramide  (REGLAN ) injection, mouth rinse, polyethylene glycol, simethicone , sodium chloride  flush, sodium chloride  flush   Vital Signs  Vitals:   02/20/24 0006 02/20/24 0200 02/20/24 0300 02/20/24 0520  BP: 95/69   99/76  Pulse:    90  Resp: (!) 24 17  15   Temp: 98.4 F (36.9 C)   (!) 97.5 F (36.4 C)  TempSrc: Oral   Oral  SpO2: 96% 94%  96%  Weight:   77.7 kg   Height:        Intake/Output Summary (Last 24 hours) at 02/20/2024 0548 Last data filed at 02/20/2024 0403 Gross per 24 hour  Intake --  Output 1150 ml  Net -1150 ml      02/20/2024    3:00 AM 02/19/2024    5:00 AM 02/18/2024   11:37 AM  Last 3 Weights  Weight (lbs) 171 lb 4.8 oz 175 lb 0.7 oz 177 lb 14.6 oz  Weight (kg) 77.7 kg 79.4 kg 80.7 kg      Telemetry Atrial fibrillation rate controlled- Personally Reviewed   Physical Exam  GEN: NAD Neck: supple Cardiac: Irregular, no  rub Respiratory: CTA GI: Soft, mildly distended, no masses MS: No edema Neuro:  grossly intact Psych: Normal affect   Labs High Sensitivity Troponin:   Recent Labs  Lab 01/22/24 1243 01/22/24 1443  TROPONINIHS 126* 108*     Chemistry Recent Labs  Lab 02/15/24 0505 02/16/24 0515 02/17/24 0510 02/18/24 0440 02/19/24 0400  NA 133*   < > 134* 133* 133*  K 3.8   < > 3.3* 3.4* 3.5  CL 98   < > 100 97* 100  CO2 21*   < > 23 21* 19*  GLUCOSE 139*   < > 134* 228* 235*  BUN 37*   < > 41* 39* 41*  CREATININE 1.41*   < > 1.47* 1.40* 1.59*  CALCIUM  9.0   < > 8.7* 8.1* 8.1*  MG 2.2  --   --  2.1 2.0  PROT 6.0*   < > 6.2* 5.8* 5.4*  ALBUMIN 2.8*   < > 2.9* 2.7* 2.6*  AST 12*   < > 14* 13* 13*  ALT 17   < > 15 15 13   ALKPHOS 69   < > 73 68 69  BILITOT 0.6   < > 0.4 0.8 0.5  GFRNONAA 52*   < > 49* 52* 45*  ANIONGAP 14   < > 11 15 14    < > = values in this interval not displayed.    Hematology Recent Labs  Lab 02/17/24 0510 02/18/24 0440 02/19/24 0400  WBC 4.4 4.3 4.5  RBC 3.99* 3.89* 3.55*  HGB 11.2* 10.9* 10.2*  HCT 34.9* 34.2* 31.4*  MCV 87.5 87.9 88.5  MCH 28.1 28.0 28.7  MCHC 32.1 31.9 32.5  RDW 20.1* 20.3* 19.7*  PLT 698* 649* 623*    Radiology  DG Abd 2 Views Result Date: 02/19/2024 EXAM: 2 VIEW XRAY OF THE ABDOMEN 02/19/2024 07:35:00 AM COMPARISON: 02/18/2024 CLINICAL HISTORY: adynamic ileus FINDINGS: LINES, TUBES AND DEVICES: Catheter introduced through the rectum extends to the right upper quadrant in the region of the hepatic flexure. BOWEL: Decreased distal colonic distention from prior exam. Gaseous distention at the hepatic flexure persists but has improved, maximum dimension 9 cm, previously 12.4 cm. Similar gaseous small bowel distention centrally. There is no evidence of free air. SOFT TISSUES: No opaque urinary calculi. BONES: No acute osseous abnormality. IMPRESSION: 1. Improved colonic ileus. Persistent but improved gaseous distention at the hepatic  flexure. Similar gaseous small bowel distention centrally 2. No evidence of free air. Electronically signed by: Andrea Gasman MD 02/19/2024 11:16 AM EDT RP Workstation: [id number]   DG Abd 2 Views Result Date: 02/18/2024 CLINICAL DATA:  Adynamic ileus EXAM: DG ABDOMEN 2V COMPARISON:  Abdominal radiograph dated 02/17/2024 FINDINGS: Slightly increased gaseous dilation of the colon. Decreased gas-filled dilation of the small bowel. No free air or pneumatosis. No abnormal radio-opaque calculi or mass effect. No acute or substantial osseous abnormality. The sacrum and coccyx are partially obscured by overlying bowel contents. Prostate fiducials project over the lower midline pelvis. IMPRESSION: Slightly increased gaseous dilation of the colon. Decreased gas-filled dilation of the small bowel. Electronically Signed   By: Limin  Xu M.D.   On: 02/18/2024 14:58   DG C-Arm 1-60 Min-No Report Result Date: 02/18/2024 Fluoroscopy was utilized by the requesting physician.  No radiographic interpretation.    Patient Profile   78 y.o. male with coronary artery disease, ischemic cardiomyopathy, chronic biventricular heart failure, coronary artery disease admitted with ischemic colitis/sepsis.  Had PCI of the LAD 8/25; stent thrombosis with PCI of the LAD 9/9.  Echocardiogram September 10 showed ejection fraction 20 to 25%, moderate left ventricular hypertrophy, mild right ventricular enlargement, moderate RV dysfunction, mild to moderate tricuspid regurgitation, trace aortic insufficiency.  Initially required milrinone  which has been discontinued.  Assessment & Plan   1 coronary artery disease-no chest pain following PCI of the LAD (secondary to stent thrombosis).  Continue Brilinta .  Not on aspirin  given recent GI bleed.  Continue statin.  2 atrial fibrillation with rapid ventricular response-continue IV amiodarone  and digoxin  (need to be careful with digoxin  dosing in the setting of renal insufficiency and  amiodarone  use).  Continue apixaban .  Possible TEE guided cardioversion tomorrow if schedule allows.  3 acute on chronic heart failure reduced ejection fraction-remains euvolemic on exam. Continue present dose of Demadex  and spironolactone .  Bmet pending this AM. Note he is not on an SGLT2 inhibitor with fungal rash.  Blood pressure/renal function will not allow ARB or Entresto .  No beta-blocker in the setting of acute CHF.  4 acute kidney injury-Cr pending this AM.  5 anemia/GI bleed-follow Hgb.   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  For questions or updates, please contact [facility] Please consult [email or web address] for contact info under    Signed, Redell Shallow, MD  02/20/2024, 5:48 AM

## 2024-02-20 NOTE — Plan of Care (Signed)
  Problem: Education: Goal: Knowledge of General Education information will improve Description: Including pain rating scale, medication(s)/side effects and non-pharmacologic comfort measures Outcome: Progressing   Problem: Clinical Measurements: Goal: Ability to maintain clinical measurements within normal limits will improve Outcome: Progressing Goal: Will remain free from infection Outcome: Progressing Goal: Respiratory complications will improve Outcome: Progressing   Problem: Pain Managment: Goal: General experience of comfort will improve and/or be controlled Outcome: Progressing

## 2024-02-20 NOTE — Plan of Care (Signed)
   Medical records reviewed including progress, labs and imaging. Patient is tentatively planned for cardioversion tomorrow pending scheduling. Cr improved from 1.59 to 1.39 today. Improving GI symptoms after colonoscopic decompression and tube placement.  Goals of care are clear for full scope treatment. Patient and family have been encouraged to reach out for needs if they arise.   PMT will continue to follow peripherally. Thank you for your referral and allowing PMT to assist in [name]'s care.   Chase Finigan, PA-C Palliative Medicine Team  Team Phone # [phone]   NO CHARGE

## 2024-02-20 NOTE — Progress Notes (Signed)
 Patient ID: Chase Combs, male   DOB: Sep 05, 1945, 78 y.o.   MRN: [id number]    Progress Note   Subjective  Day # 28 CC; refractory large and small bowel ileus  Status post colonic decompression 02/18/2024-decompression tube left in place  Patient continues to have multiple loose to soft bowel movements per day says he was having a bowel movement every couple of hours tapered off some last night.  He actually states that he feels pretty good today which is the first time he has said that in the past couple of weeks.  Tolerating clear liquids still no appetite but willing to try full liquids nausea or vomiting no current complaints of abdominal pain  Labs-sodium 133/potassium 3.3/BUN 34/creatinine 1.39 Magnesium  2.2 WBC 6.5/hemoglobin 10.7/hematocrit 33.8   Objective   Vital signs in last 24 hours: Temp:  [97.5 F (36.4 C)-98.4 F (36.9 C)] 97.5 F (36.4 C) (09/28 0738) Pulse Rate:  [69-117] 117 (09/28 0738) Resp:  [15-24] 18 (09/28 0738) BP: (95-101)/(69-87) 101/87 (09/28 0738) SpO2:  [93 %-99 %] 99 % (09/28 0738) Weight:  [77.7 kg] 77.7 kg (09/28 0300) Last BM Date : 02/19/24 General:    Early white male in NAD Heart: Irregular rate and rhythm; no murmurs-heart rate 116 Lungs: Respirations even and unlabored, lungs CTA bilaterally Abdomen: Softer, nontender, bowel sounds are present, no tympany, rectal tube in place no stool in bag  Extremities:  Without edema. Neurologic:  Alert and oriented,  grossly normal neurologically. Psych:  Cooperative. Normal mood and affect.  Intake/Output from previous day: 09/27 0701 - 09/28 0700 In: -  Out: 1150 [Urine:1150] Intake/Output this shift: No intake/output data recorded.  Lab Results: Recent Labs    02/18/24 0440 02/19/24 0400 02/20/24 0537  WBC 4.3 4.5 6.5  HGB 10.9* 10.2* 10.7*  HCT 34.2* 31.4* 33.8*  PLT 649* 623* 567*   BMET Recent Labs    02/18/24 0440 02/19/24 0400 02/20/24 0537  NA 133* 133* 133*  K 3.4*  3.5 3.3*  CL 97* 100 102  CO2 21* 19* 19*  GLUCOSE 228* 235* 197*  BUN 39* 41* 34*  CREATININE 1.40* 1.59* 1.39*  CALCIUM  8.1* 8.1* 8.1*   LFT Recent Labs    02/20/24 0537  PROT 5.6*  ALBUMIN 2.6*  AST 12*  ALT 13  ALKPHOS 64  BILITOT 0.6   PT/INR No results for input(s): LABPROT, INR in the last 72 hours.  Studies/Results: DG Abd 2 Views Result Date: 02/19/2024 EXAM: 2 VIEW XRAY OF THE ABDOMEN 02/19/2024 07:35:00 AM COMPARISON: 02/18/2024 CLINICAL HISTORY: adynamic ileus FINDINGS: LINES, TUBES AND DEVICES: Catheter introduced through the rectum extends to the right upper quadrant in the region of the hepatic flexure. BOWEL: Decreased distal colonic distention from prior exam. Gaseous distention at the hepatic flexure persists but has improved, maximum dimension 9 cm, previously 12.4 cm. Similar gaseous small bowel distention centrally. There is no evidence of free air. SOFT TISSUES: No opaque urinary calculi. BONES: No acute osseous abnormality. IMPRESSION: 1. Improved colonic ileus. Persistent but improved gaseous distention at the hepatic flexure. Similar gaseous small bowel distention centrally 2. No evidence of free air. Electronically signed by: Andrea Gasman MD 02/19/2024 11:16 AM EDT RP Workstation: [id number]   DG Abd 2 Views Result Date: 02/18/2024 CLINICAL DATA:  Adynamic ileus EXAM: DG ABDOMEN 2V COMPARISON:  Abdominal radiograph dated 02/17/2024 FINDINGS: Slightly increased gaseous dilation of the colon. Decreased gas-filled dilation of the small bowel. No free air or pneumatosis. No abnormal radio-opaque  calculi or mass effect. No acute or substantial osseous abnormality. The sacrum and coccyx are partially obscured by overlying bowel contents. Prostate fiducials project over the lower midline pelvis. IMPRESSION: Slightly increased gaseous dilation of the colon. Decreased gas-filled dilation of the small bowel. Electronically Signed   By: Limin  Xu M.D.   On: 02/18/2024  14:58   DG C-Arm 1-60 Min-No Report Result Date: 02/18/2024 Fluoroscopy was utilized by the requesting physician.  No radiographic interpretation.       Assessment / Plan:    #58 78 year old male with recent NSTEMI secondary to in-stent thrombosis history of severe CHF with EF of about 20%, new A-fib with RVR.  Current prolonged hospitalization complicated by right-sided ischemic colitis and transient lower GI bleeding in setting of Brilinta /aspirin /Eliquis  and then development of refractory large and small bowel ileus  No response to subcu or IV neostigmine .  Did not tolerate IV neostigmine  well would not use in future Patient underwent colonoscopic decompression on 02/18/2024 with decompression tube left in place.  He has had significant proved meant over the past 48 hours, has had ongoing stooling which is a good sign. No current complaints of abdominal pain, tolerating clear liquids and willing to try advancing to full liquids.  #2 possible SIBO completing 14 days of Xifaxan  #3 hypokalemia-continue replacement #4 malnutrition secondary to acute prolonged illness and poor oral intake-better over the past 48 hours full liquids today needs protein shakes 2-3 times daily, hopefully tomorrow can advance to solid diet  Plan as above Check plain film today GI will continue to follow with you        Active Problems:   Coronary artery disease involving native coronary artery of native heart without angina pectoris   Essential hypertension   Hypokalemia   Acute on chronic systolic CHF (congestive heart failure) (HCC)   Malnutrition of moderate degree   Colonic pseudoobstruction   Small intestinal bacterial overgrowth (SIBO)   Atrial fibrillation with RVR (HCC)   Pressure injury of skin   AKI (acute kidney injury)   Type 2 diabetes mellitus with hyperlipidemia (HCC)   Generalized abdominal pain   Abnormal x-ray of abdomen   DNR (do not resuscitate)     LOS: 29 days   Grafton Warzecha PA-C 02/20/2024, 9:15 AM

## 2024-02-20 NOTE — Progress Notes (Signed)
 PROGRESS NOTE    Chase Combs  FMW:[id number] DOB: 04-15-1946 DOA: 01/22/2024 PCP: Wonda Worth SQUIBB, PA  Subjective: Pt seen and examined. Still doing well from abd distension perspective. Having liquid BM every 2 hours. Tolerating clear liquids. Still in rapid afib. On IV amiodarone  gtts.    Hospital Course: CC: weakness, emesis HPI: 78 year old man w/ hx of ischemic cardiomyopathy and very recent LAD stent placement for NSTEMI discharged 8/27 (no d/c summary so unclear) with progressive nausea vomiting with CT images concerning for ischemic colitis.   Feeling weak and unwell since discharge 3 days ago.  Progressive nausea and vomiting not keeping down his antiplatelet medicines.  CT images reveal intestinal air in the wall of the intestine as well as concerning findings of ileus.  His lactate was elevated.  He was volume resuscitated.  Remained hypotensive.  Placed on broad-spectrum antibiotics he received vancomycin  Zosyn  doxycycline and Flagyl .  Labs notable for acute renal failure.  Anion gap metabolic acidosis.  Leukopenia, lymphopenia.  Significant Events: Admitted 01/22/2024 to ICU by PCCM due to intestinal pneumatosis concerning for ischemic colitis 01-24-2024 to 01-27-2024 eneral surgery recommended conservative management.   Concern patient not able to absorb antiplatelet therapy, he was placed on IV cangrelor .  Patient continue to be hypotensive despite IV fluids and was placed on norepinephrine  infusion, consistent with septic shock due to abdominal source/ ischemic colitis.  during hospitalization required PRBC transfusion due to blood loss anemia.   There was concerns of in-stent thrombosis from recent PCI therefore antiplatelets were switched from Cangrelor  to Plavix  and started on milrinone  drip due to cardiogenic shock.   Developed atrial fibrillation with rapid ventricular response, placed on IV amiodarone .  Transition to Aggrastat for anticoagulation.  09/05 GI consulted,  clinical picture consistent with colonic ileus, Ogilvie syndrome.  09/08 PRBC transfusion x1, transitioned to clopidogrel .  09/09 chest pain with new antero lateral ST elevations. Underwent urgent cardiac catheterization, LAD with ostially occluded stent with acute stent thrombosis (Culprit vessel). Successful percutaneous coronary intervention ostial proximal LAD, PTCA with balloon dilatation.  Significant volume overloaded with low SV02 and CVP very high, placed on IV furosemide  and milrinone .  09/10 completed antibiotic course.  09/10 EGD with esophageal ulcer with no bleeding and no stigmata of recent bleeding.  LA grade C reflux esophagitis with no bleeding.  Duodenal erosions without bleeding.  Flexible sigmoidoscopy positive blood in the rectum, in the sigmoid colon, in the descending colon and in the transverse colon. No obvious source of bleed.  Diverticulosis in the sigmoid colon and in the descending colon. No active bleeding.  A single superficial ulcer in the rectum. No active bleeding.   09/12 transition to ticagrelor .  Slowly weaned off TPN, diet as tolerated.   09/19 continue colonic pseudo obstruction, imaging with progression of colonic distention. Started on rifaximin .  Pending cardioversion until bowel function improves.  09/21 pending direct current cardioversion.  09/22 CT abdomen and pelvis dilated loops of small bowel and colon with air fluid levels in the distal colon and rectum. No obstruction. Appearance consistent with ileus or functional bowel disturbance.  09/23 trial of neostigmine   02-18-2024 persistent colonic pseudo obstruction despite several days of SQ neostigmine  and 1 dose of IV neostigmine . All without improvement in his symptoms. Abd XR continue to show dilated colon.  Pt taken for decompression colonoscopy with colonic decompression tube placed  Admission Labs: Na 132, K 4.5, CO2 of 31, BUN 64, scr 2.55, glu 197 T prot 6.2, alb  3.0, AST 29, ALT 31, alk pho 64,  t. Bili 1.4 WBC 2.4, HgB 13.9, plt 489 BNP 220 Procalcitonin 1.23  Admission Imaging Studies: CXR Low lung volumes with bibasilar linear and patchy opacities. 2. Small stable bilateral pleural effusion. 3. Elevated right hemidiaphragm. CT chest/abd/pelvis Significantly improved multifocal, bilateral pneumonia since 01/14/24. Persistent airspace and ground glass opacities in the right lower lobe, left lower lobe, and posterior left upper lobe/lingula, likely residual pneumonia or aspiration. 2. Gas along the wall of the ascending colon and cecum, suspicious for pneumatosis. Findings called to PA [place] at 3:19 pm. 3. Fluid-filled colon suggests a diarrheal state. Diffuse colonic and small bowel distention likely represent Ileus. 4. Increased small volume pelvic and new right-sided pericolonic trace fluid, likely secondary to the colonic proces. 5. Incidental findings, including: Pulmonary artery enlargement suggests pulmonary arterial hypertension. Aortic and coronary artery atherosclerosis. Right nephrolithiasis. Suspicion of minimal chronic calcific pancreatitis.  Significant Labs:   Significant Imaging Studies: 01-30-2024 CTA GI bleed Severe narrowing is noted at origin of celiac artery with poststenotic dilatation. No definite evidence of contrast extravasation to suggest gastrointestinal bleeding. Mild wall and fold thickening of ascending colon is noted suggesting possible infectious or inflammatory colitis 02-02-2024 echo LVEF 20-25%. eft ventricle has severely decreased function  02-14-2024 CT abd/pelvis Dilated loops of small bowel and colon with air-fluid levels in the distal colon and rectum indicating diarrheal process. No lead point for obstruction is identified. No pneumatosis or free air. Presumably the appearance is due to ileus or functional bowel disturbance. 2. 0.3 cm right kidney lower pole nonobstructive stone. 3. Multilevel lumbar degenerative facet arthropathy with multilevel  foraminal impingement. 4. Umbilical hernia contains adipose tissue. 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 5. High density in the gallbladder probably from sludge. 6. Mild bibasilar atelectasis. 7. Aortic Atherosclerosis (ICD10-I70.0). Coronary atherosclerosis. Prominent cardiomegaly.  Antibiotic Therapy: Anti-infectives (From admission, onward)    Start     Dose/Rate Route Frequency Ordered Stop   02/11/24 1000  rifaximin  (XIFAXAN ) tablet 550 mg        550 mg Oral 3 times daily 02/11/24 0908     02/01/24 1645  cefTRIAXone  (ROCEPHIN ) 2 g in sodium chloride  0.9 % 100 mL IVPB  Status:  Discontinued        2 g 200 mL/hr over 30 Minutes Intravenous Every 24 hours 02/01/24 1553 02/03/24 1029   01/29/24 2200  metroNIDAZOLE  (FLAGYL ) tablet 500 mg  Status:  Discontinued        500 mg Oral Every 12 hours 01/29/24 1627 02/03/24 1029   01/22/24 2200  piperacillin -tazobactam (ZOSYN ) IVPB 3.375 g        3.375 g 12.5 mL/hr over 240 Minutes Intravenous Every 8 hours 01/22/24 1602 01/28/24 2359   01/22/24 1800  metroNIDAZOLE  (FLAGYL ) IVPB 500 mg  Status:  Discontinued        500 mg 100 mL/hr over 60 Minutes Intravenous Every 8 hours 01/22/24 1653 01/23/24 0857   01/22/24 1545  metroNIDAZOLE  (FLAGYL ) IVPB 500 mg  Status:  Discontinued        500 mg 100 mL/hr over 60 Minutes Intravenous  Once 01/22/24 1533 01/22/24 1634   01/22/24 1330  doxycycline (VIBRAMYCIN) 100 mg in sodium chloride  0.9 % 250 mL IVPB  Status:  Discontinued        100 mg 125 mL/hr over 120 Minutes Intravenous  Once 01/22/24 1320 01/22/24 1649   01/22/24 1315  azithromycin (ZITHROMAX) 500 mg in sodium chloride  0.9 % 250 mL  IVPB  Status:  Discontinued        500 mg 250 mL/hr over 60 Minutes Intravenous  Once 01/22/24 1309 01/22/24 1319   01/22/24 1315  ceFEPIme  (MAXIPIME ) 2 g in sodium chloride  0.9 % 100 mL IVPB        2 g 200 mL/hr over 30 Minutes Intravenous  Once 01/22/24 1309 01/22/24 1515   01/22/24 1315   vancomycin  (VANCOREADY) IVPB 1750 mg/350 mL  Status:  Discontinued        1,750 mg 175 mL/hr over 120 Minutes Intravenous  Once 01/22/24 1311 01/22/24 1756       Procedures: 02-01-2024 LHC LM: Distal 45% stenosis,  LAD: Ostially occluded stent with acute stent thrombosis (Culprit vessel),  [place]: 50% in-stent restenosis,  Lcx: Mild diffuse disease,  RCA: Not engaged today. LVEDP 27 mmHg.  Successful percutaneous coronary intervention ostial prox LAD.  PTCA with serial balloon dilatation, up to 3.0  X 12 mm [place] balloon up to 20 atm 02-18-2024 endoscopic colonic decompression with tube placement  Consultants: PCCM Cardiology General surgery    Assessment and Plan: * Pneumatosis intestinalis of large intestine-resolved as of 02/16/2024 01-22-2024 to 01-29-2024.  admitted to ICU by PCCM. General surgery consulted. CT abd showed pneumatosis. Surgery did not feel that pt was a operative candidate. IV abx started. Pt developed gastric distension and ileus.  Due to N/V and ileus, NG tube was placed for gastric decompression. By 01-26-2024, abd distension improved, NG tube removed and clear liquid diet started.   Atrial fibrillation with RVR (HCC) 02-04-2024 pt started on digoxin  and IV amiodarone  for rate control as pt's blood pressure too low to start any other rate controlled meds due to cardiogenic shock.  02-05-2024 through 02-15-2024 afib rate remains uncontrolled. Due to ongoing colonic ileus, cardiology does not want to cardiovert.  02-16-2024 remains on IV amiodarone  gtts. Rate still in the mid 100s. SBP borderline low at 90-100s  02/17/24 pt remains on IV amiodarone  and eliquis . HR still uncontrolled. Cardiology managing his rapid afib.  02/18/24 on digoxin .  remains on IV amiodarone  and eliquis . Cards managing afib. Potential for DCCV after colonic issues resolved.  02/19/24 remains on IV amiodarone  gtts and eliquis . Cards thinking about possible DCCV on Monday. Attempt to keep serum K  >4.0 and Mg > 2.0  02/20/24 continue IV amiodarone . With his frequent liquid Bms, having a hard time keeping up with his potassium losses. Will give more IV and po kcl today. Mg level looks ok. Cards looking to see if pt can get DCCV tomorrow. Will make him NPO after MN in case.   Colonic pseudoobstruction 01-27-2025 GI consulted due to colonic ileus that was not responding to conservative measures. Pt has persistent colonic distension.  GI recommended to continue Flexiseal and to treat empiric treatment with flagyl  for presumed SIBO(small intestine bacterial overgrowth).  02-01-2024 pt develops rectal bleeding and drops his HgB from 9.2 g/dl to 7.3 g/dl.  02-02-2024 pt taken for flex sigmoidoscopy and EGD that shows bright red blood in the colon from the rectum to the transverse colon underlying mucosa appeared normal few diverticuli noted in the left and sigmoid no active bleeding, single nonbleeding superficial ulcer in the rectum.  EGD showed superficial esophageal ulcer with no bleeding, grade C esophagitis with no bleeding and a few erosions in the first portion of the duodenum  02-03-2024 to 02-10-2024 persistent abd distension despite liquid bowel movements.    02-11-2024 Xifaxan  started for additional SIBO treatment due to persistent  abdominal distension.   02-15-2024 after 72 hours of no improvement with treatment with Xifaxan  for SIBO, pt started on SQ neostigmine  with IV robinul  to help with colonic decompression.  02-16-2024 GI continuing with SQ neostigmine  q6h.  02-17-2024 GI managing colonic ileus. Abd XR ordered. Pt may need endoscopic decompression. GI has made pt NPO.  02/18/24 Pt states he has not had any BM overnight. Appears he was given 1 mg IV neostigmine  instead of SQ injection. He states he was extremely nauseated with IV neostigmine . Today, Abd XR still show extremely dilated colon. XR no changes from yesterday.  02-19-2024 s/p colonoscopy with decompression tube  placed yesterday. Neostigmine  stopped on 02-18-2024.  02/20/24 still doing well. Having liquid Bms. Abd remains soft. GI to decide when colonic decompression tube can be removed.    AKI (acute kidney injury) 01-22-2024 through 01-28-2024. Admitted with AKI with Scr of 2.55. with IVF and treatment of his ileus/intestinal pneumatosis, his scr return to 1.07 by 01-28-2024  02-08-2024 through 02-11-2024 he develops another AKI during his cardiogenic shock. By 02-10-2024, scr back to 1.06  02-13-2024 scr back on the rise again after uncontrolled afib and starting of aldcatone/demadex .  02/17/24 Scr stable at 1.4 - 1.5 on demadex  and aldactone .  02/18/24 Scr stable at 1.4  02/20/24 Scr improved to 1.39 today. Was 1.59 yesterday.  Pt received multiple runs of IV KCL and IV Mg yesterday which were given with carrier   02/19/24 Scr up to 1.59. cards wants to continue demadex /aldactone  despite rising Scr.     Small intestinal bacterial overgrowth (SIBO) 01-27-2025 GI consulted due to colonic ileus that was not responding to conservative measures. Pt has persistent colonic distension.  GI recommended to continue Flexiseal and to treat empiric treatment with flagyl  for presumed SIBO(small intestine bacterial overgrowth).   02-03-2024 to 02-10-2024 persistent abd distension despite liquid bowel movements.     02-11-2024 Xifaxan  started for additional SIBO treatment due to persistent abdominal distension.    02-15-2024 after 72 hours of no improvement with treatment with Xifaxan  for SIBO, pt started on IV neostigmine  with IV robinul  to help with colonic decompression.   02-16-2024 GI continuing with SQ neostigmine  q6h.  02/17/24 remains on Xifaxan  TID. GI treating him for 14 days. End date 02-24-2024. During prior admission, flagyl  seemed to work but no longer.  02/18/24 continues with Xifaxan  TID. End date of therapy 02-24-2024.  02/19/24 continues on Xifaxan . End date of therapy 02-24-2024. This will  completed 14 days of treatment.  02/20/24 on Xifaxan . End date 02-24-2024. This is 14 days of therapy.     Acute on chronic systolic CHF (congestive heart failure) (HCC) 02-02-2024 pt develops cardiogenic shock after STEMI requiring PTCA. Pt placed on milrinone  for cardiovascular support.  02-04-2024 through 02-09-2024 pt started on IV lasix  gtts for diuresis while he has cardiogenic shock to help remove intravascular volume  02-14-2024 po demadex  and aldactone  started.  02/17/24 stable on po demadex  and aldactone . Scr 1.4-1.5 over the last 4-5 days.  02/18/24 stable. On demadex  and aldactone .  02/19/24 Scr up to 1.59. cards wants to continue demadex /aldactone  despite rising Scr.  02/20/24 Scr 1.39, BUN 34 today. Pt received multiple runs of IV KCL and IV Mg yesterday which were given with carrier  IVF. Cards wants to continue demadex /aldactone .    Hypokalemia 02/18/24 K 3.4 today. Given rapid afib and NPO status. Will give IV Kcl replacement.  02/19/24 K 3.5 today. Will give more KCL due to afib.  02/20/24 K  3.3 today. Give po and IV kcl. Will add scheduled doses of Kdur in the evening.    Cardiogenic shock (HCC)-resolved as of 02/16/2024 02-01-2024 after his STEMI from in-stent restenosis requiring PCTA on 02-01-2024. Pt develops cardiogenic shock requiring IV milrinone  and transfer to CICU.  02-02-2024 to 02-09-2024 echo shows LVEF dropped to 20-25%. Pt kept on milrinone  until 02-09-2024 when it was eventually weaned off.  Anemia due to acute blood loss-resolved as of 02/16/2024 GI bleed.   SP 4 units PRBC transfusion.   EGD with flexible sigmoidoscopy with red blood in the colon.  Grade 1 esophagitis.  Continue pantoprazole .  Follow up hgb is 11.3   Acute blood loss anemia-resolved as of 02/16/2024 02-01-2024 pt develops rectal bleeding and drops his HgB from 9.2 g/dl to 7.3 g/dl.  02-02-2024 pt transfused 2 units PRBC.  pt taken for flex sigmoidoscopy and EGD that shows  bright red blood in the colon from the rectum to the transverse colon underlying mucosa appeared normal few diverticuli noted in the left and sigmoid no active bleeding, single nonbleeding superficial ulcer in the rectum.  EGD showed superficial esophageal ulcer with no bleeding, grade C esophagitis with no bleeding and a few erosions in the first portion of the duodenum  Gastrointestinal hemorrhage-resolved as of 02/16/2024 02-01-2024 pt develops rectal bleeding and drops his HgB from 9.2 g/dl to 7.3 g/dl.   02-02-2024 pt taken for flex sigmoidoscopy and EGD that shows bright red blood in the colon from the rectum to the transverse colon underlying mucosa appeared normal few diverticuli noted in the left and sigmoid no active bleeding, single nonbleeding superficial ulcer in the rectum.  EGD showed superficial esophageal ulcer with no bleeding, grade C esophagitis with no bleeding and a few erosions in the first portion of the duodenum  Type 2 diabetes mellitus with hyperlipidemia (HCC) 02-16-2024 CBG stable without SSI  02/17/24 CBG stable  02/18/24 stable CBG  02/19/24 CBG stable.  02/20/24 stable   Pressure injury of skin Wound 02/01/24 1600 Pressure Injury Buttocks Stage 2 -  Partial thickness loss of dermis presenting as a shallow open injury with a red, pink wound bed without slough. (Active)    Malnutrition of moderate degree Nutrition Status: Nutrition Problem: Moderate Malnutrition Etiology: acute illness Signs/Symptoms: energy intake < 75% for > 7 days, moderate muscle depletion, mild muscle depletion Interventions: Boost Breeze, MVI, Refer to RD note for recommendations    Essential hypertension 02-17-2024 unable to start HTN meds due to low BP  02/18/24 BP still borderline low due to rapid afib.  02/19/24 BP low today. Cannot be on any further HTN meds due to low BP.  02/20/24 BP too low to add any additional meds. Maybe if he is back in NSR, his BP will  increase   Coronary artery disease involving native coronary artery of native heart without angina pectoris 09-09--2025 pt has in-stent thrombosis causing STEMI. Taken to cath lab for [facility] to PTCA.  02-16-2024 pt on Brilinta . ASA on hold due to GI bleed on 02-02-2024.  Restart crestor  20 mg daily.  02/17/24 remains on Brilinta  and Eliquis . No ASA due to GI bleeding on 02-02-2024.  02/18/24 stable on Eliquis  and Brilinta . Cards holding on ASA due to GI bleeding on 02-02-2024  02/19/24 on Eliquis  and Brilinta . ASA on hold due to GI bleeding on 02-02-2024.  02/20/24 on Brilinta  and Eliquis . Off ASA due to GI bleeding on 02-02-2024.   ST elevation myocardial infarction involving left main coronary artery (  HCC)-resolved as of 02/16/2024 02-01-2024 At around 1:15 PM, patient started having right shoulder pain and started feeling diaphoretic. EKG showed new anterolateral ST elevation. Cardiology felt that pt likely had occluded his recently placed LAD stent with several days of likely suboptimal platelet inhibition. Pt taken emergently to cath lab where LHC showed LAD: Ostially occluded stent with acute stent thrombosis. Pt subsequently develops cardiogenic shock and requires IV milrinone  for 7 days.   DNR (do not resuscitate) Made DNR/DNI on 01-29-2024 by palliative care        DVT prophylaxis: Place and maintain sequential compression device Start: 02/04/24 1132 Place TED hose Start: 02/04/24 0941 SCD's Start: 02/01/24 1555 apixaban  (ELIQUIS ) tablet 5 mg     Code Status: Do not attempt resuscitation (DNR) PRE-ARREST INTERVENTIONS DESIRED Family Communication: no family at bedside Disposition Plan: unknown Reason for continuing need for hospitalization: remains on IV amiodarone . Continues with colonic decompression tube. Getting more IV Kcl. Possible DCCV tomorrow with cardiology.  Objective: Vitals:   02/20/24 0200 02/20/24 0300 02/20/24 0520 02/20/24 0738  BP:   99/76 101/87  Pulse:    90 (!) 117  Resp: 17  15 18   Temp:   (!) 97.5 F (36.4 C) (!) 97.5 F (36.4 C)  TempSrc:   Oral Oral  SpO2: 94%  96% 99%  Weight:  77.7 kg    Height:        Intake/Output Summary (Last 24 hours) at 02/20/2024 0949 Last data filed at 02/20/2024 0403 Gross per 24 hour  Intake --  Output 1150 ml  Net -1150 ml   Filed Weights   02/18/24 1137 02/19/24 0500 02/20/24 0300  Weight: 80.7 kg 79.4 kg 77.7 kg    Examination:  Physical Exam Vitals and nursing note reviewed.  Constitutional:      General: He is not in acute distress.    Appearance: He is not toxic-appearing.  HENT:     Head: Normocephalic and atraumatic.  Eyes:     General: No scleral icterus. Cardiovascular:     Rate and Rhythm: Tachycardia present. Rhythm irregular.  Pulmonary:     Effort: Pulmonary effort is normal.     Breath sounds: Normal breath sounds.  Abdominal:     General: Bowel sounds are normal. There is no distension.     Palpations: Abdomen is soft.  Musculoskeletal:     Right lower leg: Edema present.     Left lower leg: Edema present.     Comments: Trace to +1 pitting pretibial/ankle edema bilaterally  Skin:    General: Skin is warm and dry.     Capillary Refill: Capillary refill takes less than 2 seconds.  Neurological:     Mental Status: He is alert and oriented to person, place, and time.     Data Reviewed: I have personally reviewed following labs and imaging studies  CBC: Recent Labs  Lab 02/16/24 0515 02/17/24 0510 02/18/24 0440 02/19/24 0400 02/20/24 0537  WBC 2.9* 4.4 4.3 4.5 6.5  HGB 10.8* 11.2* 10.9* 10.2* 10.7*  HCT 34.3* 34.9* 34.2* 31.4* 33.8*  MCV 88.9 87.5 87.9 88.5 88.9  PLT 694* 698* 649* 623* 567*   Basic Metabolic Panel: Recent Labs  Lab 02/14/24 0420 02/15/24 0505 02/16/24 0515 02/17/24 0510 02/18/24 0440 02/19/24 0400 02/20/24 0537  NA 135 133* 135 134* 133* 133* 133*  K 4.2 3.8 3.8 3.3* 3.4* 3.5 3.3*  CL 103 98 102 100 97* 100 102  CO2 22 21*  21* 23 21* 19* 19*  GLUCOSE 144* 139* 110* 134* 228* 235* 197*  BUN 30* 37* 44* 41* 39* 41* 34*  CREATININE 1.45* 1.41* 1.53* 1.47* 1.40* 1.59* 1.39*  CALCIUM  9.2 9.0 8.6* 8.7* 8.1* 8.1* 8.1*  MG 2.4 2.2  --   --  2.1 2.0 2.2  PHOS  --   --   --   --   --  4.3  --    GFR: Estimated Creatinine Clearance: 49.6 mL/min (A) (by C-G formula based on SCr of 1.39 mg/dL (H)). Liver Function Tests: Recent Labs  Lab 02/16/24 0515 02/17/24 0510 02/18/24 0440 02/19/24 0400 02/20/24 0537  AST 14* 14* 13* 13* 12*  ALT 14 15 15 13 13   ALKPHOS 68 73 68 69 64  BILITOT 0.6 0.4 0.8 0.5 0.6  PROT 5.8* 6.2* 5.8* 5.4* 5.6*  ALBUMIN 2.7* 2.9* 2.7* 2.6* 2.6*   BNP (last 3 results) Recent Labs    01/11/24 0539 01/13/24 0537 01/22/24 1243  BNP 842.8* 1,747.3* 220.4*   CBG: Recent Labs  Lab 02/15/24 1559 02/15/24 2100 02/16/24 0601 02/16/24 2058 02/18/24 2122  GLUCAP 116* 131* 116* 166* 148*   Radiology Studies: DG Abd 2 Views Result Date: 02/19/2024 EXAM: 2 VIEW XRAY OF THE ABDOMEN 02/19/2024 07:35:00 AM COMPARISON: 02/18/2024 CLINICAL HISTORY: adynamic ileus FINDINGS: LINES, TUBES AND DEVICES: Catheter introduced through the rectum extends to the right upper quadrant in the region of the hepatic flexure. BOWEL: Decreased distal colonic distention from prior exam. Gaseous distention at the hepatic flexure persists but has improved, maximum dimension 9 cm, previously 12.4 cm. Similar gaseous small bowel distention centrally. There is no evidence of free air. SOFT TISSUES: No opaque urinary calculi. BONES: No acute osseous abnormality. IMPRESSION: 1. Improved colonic ileus. Persistent but improved gaseous distention at the hepatic flexure. Similar gaseous small bowel distention centrally 2. No evidence of free air. Electronically signed by: Andrea Gasman MD 02/19/2024 11:16 AM EDT RP Workstation: [id number]   DG Abd 2 Views Result Date: 02/18/2024 CLINICAL DATA:  Adynamic ileus EXAM: DG ABDOMEN  2V COMPARISON:  Abdominal radiograph dated 02/17/2024 FINDINGS: Slightly increased gaseous dilation of the colon. Decreased gas-filled dilation of the small bowel. No free air or pneumatosis. No abnormal radio-opaque calculi or mass effect. No acute or substantial osseous abnormality. The sacrum and coccyx are partially obscured by overlying bowel contents. Prostate fiducials project over the lower midline pelvis. IMPRESSION: Slightly increased gaseous dilation of the colon. Decreased gas-filled dilation of the small bowel. Electronically Signed   By: Limin  Xu M.D.   On: 02/18/2024 14:58   DG C-Arm 1-60 Min-No Report Result Date: 02/18/2024 Fluoroscopy was utilized by the requesting physician.  No radiographic interpretation.    Scheduled Meds:  apixaban   5 mg Oral BID   Chlorhexidine  Gluconate Cloth  6 each Topical Q0600   digoxin   0.0625 mg Oral Daily   feeding supplement  1 Container Oral TID BM   Gerhardt's butt cream   Topical BID   magic mouthwash  5 mL Oral QID   melatonin  5 mg Oral QHS   multivitamin with minerals  1 tablet Oral Daily   nystatin    Topical BID   pantoprazole   40 mg Oral BID   potassium chloride   40 mEq Oral Q4H   potassium chloride   40 mEq Oral BID   rifaximin   550 mg Oral TID   rosuvastatin   20 mg Oral Daily   sodium chloride  flush  10-40 mL Intracatheter Q12H   sodium chloride  flush  3  mL Intravenous Q12H   spironolactone   25 mg Oral Daily   ticagrelor   90 mg Oral BID   torsemide   40 mg Oral Daily   Continuous Infusions:  amiodarone  30 mg/hr (02/20/24 0000)   potassium chloride  10 mEq (02/20/24 0916)     LOS: 29 days   Time spent: 55 minutes  Camellia Door, DO  [facility]  02/20/2024, 9:49 AM

## 2024-02-20 NOTE — H&P (View-Only) (Signed)
 Rounding Note   Patient Name: Chase Combs Date of Encounter: 02/20/2024  [facility] Cardiologist: Alm Clay, MD   Subjective Pt denies CP or dyspnea  Scheduled Meds:  apixaban   5 mg Oral BID   Chlorhexidine  Gluconate Cloth  6 each Topical Q0600   digoxin   0.0625 mg Oral Daily   famotidine   20 mg Oral Daily   feeding supplement  1 Container Oral TID BM   Gerhardt's butt cream   Topical BID   magic mouthwash  5 mL Oral QID   melatonin  5 mg Oral QHS   multivitamin with minerals  1 tablet Oral Daily   nystatin    Topical BID   pantoprazole   40 mg Oral BID   rifaximin   550 mg Oral TID   rosuvastatin   20 mg Oral Daily   sodium chloride  flush  10-40 mL Intracatheter Q12H   sodium chloride  flush  3 mL Intravenous Q12H   spironolactone   25 mg Oral Daily   ticagrelor   90 mg Oral BID   torsemide   40 mg Oral Daily   Continuous Infusions:  amiodarone  30 mg/hr (02/20/24 0000)   PRN Meds: acetaminophen , ALPRAZolam , atropine , diphenhydrAMINE , guaiFENesin -dextromethorphan , ipratropium-albuterol , metoCLOPramide  (REGLAN ) injection, mouth rinse, polyethylene glycol, simethicone , sodium chloride  flush, sodium chloride  flush   Vital Signs  Vitals:   02/20/24 0006 02/20/24 0200 02/20/24 0300 02/20/24 0520  BP: 95/69   99/76  Pulse:    90  Resp: (!) 24 17  15   Temp: 98.4 F (36.9 C)   (!) 97.5 F (36.4 C)  TempSrc: Oral   Oral  SpO2: 96% 94%  96%  Weight:   77.7 kg   Height:        Intake/Output Summary (Last 24 hours) at 02/20/2024 0548 Last data filed at 02/20/2024 0403 Gross per 24 hour  Intake --  Output 1150 ml  Net -1150 ml      02/20/2024    3:00 AM 02/19/2024    5:00 AM 02/18/2024   11:37 AM  Last 3 Weights  Weight (lbs) 171 lb 4.8 oz 175 lb 0.7 oz 177 lb 14.6 oz  Weight (kg) 77.7 kg 79.4 kg 80.7 kg      Telemetry Atrial fibrillation rate controlled- Personally Reviewed   Physical Exam  GEN: NAD Neck: supple Cardiac: Irregular, no  rub Respiratory: CTA GI: Soft, mildly distended, no masses MS: No edema Neuro:  grossly intact Psych: Normal affect   Labs High Sensitivity Troponin:   Recent Labs  Lab 01/22/24 1243 01/22/24 1443  TROPONINIHS 126* 108*     Chemistry Recent Labs  Lab 02/15/24 0505 02/16/24 0515 02/17/24 0510 02/18/24 0440 02/19/24 0400  NA 133*   < > 134* 133* 133*  K 3.8   < > 3.3* 3.4* 3.5  CL 98   < > 100 97* 100  CO2 21*   < > 23 21* 19*  GLUCOSE 139*   < > 134* 228* 235*  BUN 37*   < > 41* 39* 41*  CREATININE 1.41*   < > 1.47* 1.40* 1.59*  CALCIUM  9.0   < > 8.7* 8.1* 8.1*  MG 2.2  --   --  2.1 2.0  PROT 6.0*   < > 6.2* 5.8* 5.4*  ALBUMIN 2.8*   < > 2.9* 2.7* 2.6*  AST 12*   < > 14* 13* 13*  ALT 17   < > 15 15 13   ALKPHOS 69   < > 73 68 69  BILITOT 0.6   < > 0.4 0.8 0.5  GFRNONAA 52*   < > 49* 52* 45*  ANIONGAP 14   < > 11 15 14    < > = values in this interval not displayed.    Hematology Recent Labs  Lab 02/17/24 0510 02/18/24 0440 02/19/24 0400  WBC 4.4 4.3 4.5  RBC 3.99* 3.89* 3.55*  HGB 11.2* 10.9* 10.2*  HCT 34.9* 34.2* 31.4*  MCV 87.5 87.9 88.5  MCH 28.1 28.0 28.7  MCHC 32.1 31.9 32.5  RDW 20.1* 20.3* 19.7*  PLT 698* 649* 623*    Radiology  DG Abd 2 Views Result Date: 02/19/2024 EXAM: 2 VIEW XRAY OF THE ABDOMEN 02/19/2024 07:35:00 AM COMPARISON: 02/18/2024 CLINICAL HISTORY: adynamic ileus FINDINGS: LINES, TUBES AND DEVICES: Catheter introduced through the rectum extends to the right upper quadrant in the region of the hepatic flexure. BOWEL: Decreased distal colonic distention from prior exam. Gaseous distention at the hepatic flexure persists but has improved, maximum dimension 9 cm, previously 12.4 cm. Similar gaseous small bowel distention centrally. There is no evidence of free air. SOFT TISSUES: No opaque urinary calculi. BONES: No acute osseous abnormality. IMPRESSION: 1. Improved colonic ileus. Persistent but improved gaseous distention at the hepatic  flexure. Similar gaseous small bowel distention centrally 2. No evidence of free air. Electronically signed by: Andrea Gasman MD 02/19/2024 11:16 AM EDT RP Workstation: [id number]   DG Abd 2 Views Result Date: 02/18/2024 CLINICAL DATA:  Adynamic ileus EXAM: DG ABDOMEN 2V COMPARISON:  Abdominal radiograph dated 02/17/2024 FINDINGS: Slightly increased gaseous dilation of the colon. Decreased gas-filled dilation of the small bowel. No free air or pneumatosis. No abnormal radio-opaque calculi or mass effect. No acute or substantial osseous abnormality. The sacrum and coccyx are partially obscured by overlying bowel contents. Prostate fiducials project over the lower midline pelvis. IMPRESSION: Slightly increased gaseous dilation of the colon. Decreased gas-filled dilation of the small bowel. Electronically Signed   By: Limin  Xu M.D.   On: 02/18/2024 14:58   DG C-Arm 1-60 Min-No Report Result Date: 02/18/2024 Fluoroscopy was utilized by the requesting physician.  No radiographic interpretation.    Patient Profile   78 y.o. male with coronary artery disease, ischemic cardiomyopathy, chronic biventricular heart failure, coronary artery disease admitted with ischemic colitis/sepsis.  Had PCI of the LAD 8/25; stent thrombosis with PCI of the LAD 9/9.  Echocardiogram September 10 showed ejection fraction 20 to 25%, moderate left ventricular hypertrophy, mild right ventricular enlargement, moderate RV dysfunction, mild to moderate tricuspid regurgitation, trace aortic insufficiency.  Initially required milrinone  which has been discontinued.  Assessment & Plan   1 coronary artery disease-no chest pain following PCI of the LAD (secondary to stent thrombosis).  Continue Brilinta .  Not on aspirin  given recent GI bleed.  Continue statin.  2 atrial fibrillation with rapid ventricular response-continue IV amiodarone  and digoxin  (need to be careful with digoxin  dosing in the setting of renal insufficiency and  amiodarone  use).  Continue apixaban .  Possible TEE guided cardioversion tomorrow if schedule allows.  3 acute on chronic heart failure reduced ejection fraction-remains euvolemic on exam. Continue present dose of Demadex  and spironolactone .  Bmet pending this AM. Note he is not on an SGLT2 inhibitor with fungal rash.  Blood pressure/renal function will not allow ARB or Entresto .  No beta-blocker in the setting of acute CHF.  4 acute kidney injury-Cr pending this AM.  5 anemia/GI bleed-follow Hgb.   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  For questions or updates, please contact [facility] Please consult [email or web address] for contact info under    Signed, Redell Shallow, MD  02/20/2024, 5:48 AM

## 2024-02-21 ENCOUNTER — Inpatient Hospital Stay (HOSPITAL_COMMUNITY)

## 2024-02-21 ENCOUNTER — Inpatient Hospital Stay (HOSPITAL_COMMUNITY): Admitting: Anesthesiology

## 2024-02-21 ENCOUNTER — Encounter (HOSPITAL_COMMUNITY): Payer: Self-pay | Admitting: Internal Medicine

## 2024-02-21 ENCOUNTER — Encounter (HOSPITAL_COMMUNITY)
Admission: EM | Disposition: A | Discharge status: Home Health | Payer: Self-pay | Source: Home / Self Care | Attending: Internal Medicine

## 2024-02-21 DIAGNOSIS — K5981 Ogilvie syndrome: Secondary | ICD-10-CM | POA: Diagnosis not present

## 2024-02-21 DIAGNOSIS — I5023 Acute on chronic systolic (congestive) heart failure: Secondary | ICD-10-CM

## 2024-02-21 DIAGNOSIS — I4891 Unspecified atrial fibrillation: Secondary | ICD-10-CM

## 2024-02-21 DIAGNOSIS — K6389 Other specified diseases of intestine: Secondary | ICD-10-CM | POA: Diagnosis not present

## 2024-02-21 DIAGNOSIS — N179 Acute kidney failure, unspecified: Secondary | ICD-10-CM | POA: Diagnosis not present

## 2024-02-21 DIAGNOSIS — I110 Hypertensive heart disease with heart failure: Secondary | ICD-10-CM | POA: Diagnosis not present

## 2024-02-21 DIAGNOSIS — I351 Nonrheumatic aortic (valve) insufficiency: Secondary | ICD-10-CM

## 2024-02-21 DIAGNOSIS — I2510 Atherosclerotic heart disease of native coronary artery without angina pectoris: Secondary | ICD-10-CM | POA: Diagnosis not present

## 2024-02-21 DIAGNOSIS — I361 Nonrheumatic tricuspid (valve) insufficiency: Secondary | ICD-10-CM

## 2024-02-21 DIAGNOSIS — I340 Nonrheumatic mitral (valve) insufficiency: Secondary | ICD-10-CM

## 2024-02-21 HISTORY — PX: CARDIOVERSION: EP1203

## 2024-02-21 HISTORY — PX: TRANSESOPHAGEAL ECHOCARDIOGRAM (CATH LAB): EP1270

## 2024-02-21 LAB — COMPREHENSIVE METABOLIC PANEL WITH GFR
ALT: 14 U/L (ref 0–44)
AST: 15 U/L (ref 15–41)
Albumin: 2.8 g/dL — ABNORMAL LOW (ref 3.5–5.0)
Alkaline Phosphatase: 71 U/L (ref 38–126)
Anion gap: 10 (ref 5–15)
BUN: 34 mg/dL — ABNORMAL HIGH (ref 8–23)
CO2: 17 mmol/L — ABNORMAL LOW (ref 22–32)
Calcium: 8.5 mg/dL — ABNORMAL LOW (ref 8.9–10.3)
Chloride: 107 mmol/L (ref 98–111)
Creatinine, Ser: 1.55 mg/dL — ABNORMAL HIGH (ref 0.61–1.24)
GFR, Estimated: 46 mL/min — ABNORMAL LOW (ref >60)
Glucose, Bld: 113 mg/dL — ABNORMAL HIGH (ref 70–99)
Potassium: 4.4 mmol/L (ref 3.5–5.1)
Sodium: 134 mmol/L — ABNORMAL LOW (ref 135–145)
Total Bilirubin: 0.7 mg/dL (ref 0.0–1.2)
Total Protein: 5.9 g/dL — ABNORMAL LOW (ref 6.5–8.1)

## 2024-02-21 LAB — CBC
HCT: 35.5 % — ABNORMAL LOW (ref 39.0–52.0)
Hemoglobin: 11.4 g/dL — ABNORMAL LOW (ref 13.0–17.0)
MCH: 28.5 pg (ref 26.0–34.0)
MCHC: 32.1 g/dL (ref 30.0–36.0)
MCV: 88.8 fL (ref 80.0–100.0)
Platelets: 568 K/uL — ABNORMAL HIGH (ref 150–400)
RBC: 4 MIL/uL — ABNORMAL LOW (ref 4.22–5.81)
RDW: 19.6 % — ABNORMAL HIGH (ref 11.5–15.5)
WBC: 9 K/uL (ref 4.0–10.5)
nRBC: 0 % (ref 0.0–0.2)

## 2024-02-21 LAB — COOXEMETRY PANEL
Carboxyhemoglobin: 1.1 % (ref 0.5–1.5)
Methemoglobin: 0.7 % (ref 0.0–1.5)
O2 Saturation: 57 %
Total hemoglobin: 11.7 g/dL — ABNORMAL LOW (ref 12.0–16.0)

## 2024-02-21 LAB — MAGNESIUM: Magnesium: 2.2 mg/dL (ref 1.7–2.4)

## 2024-02-21 MED ORDER — LIDOCAINE 2% (20 MG/ML) 5 ML SYRINGE
INTRAMUSCULAR | Status: DC | PRN
  Administered 2024-02-21: 80 mg via INTRAVENOUS

## 2024-02-21 MED ORDER — PHENYLEPHRINE HCL (PRESSORS) 10 MG/ML IV SOLN
INTRAVENOUS | Status: DC | PRN
  Administered 2024-02-21 (×5): 100 ug via INTRAVENOUS

## 2024-02-21 MED ORDER — SODIUM CHLORIDE 0.9 % IV SOLN: 250.0000 mL | INTRAVENOUS | Status: DC | PRN

## 2024-02-21 MED ORDER — SODIUM CHLORIDE 0.9% FLUSH: 3.0000 mL | Freq: Two times a day (BID) | INTRAVENOUS | Status: DC

## 2024-02-21 MED ORDER — PROPOFOL 10 MG/ML IV BOLUS
INTRAVENOUS | Status: DC | PRN
  Administered 2024-02-21: 30 mg via INTRAVENOUS

## 2024-02-21 MED ORDER — SODIUM CHLORIDE 0.9% FLUSH: 3.0000 mL | INTRAVENOUS | Status: DC | PRN

## 2024-02-21 MED ORDER — PROPOFOL 500 MG/50ML IV EMUL
INTRAVENOUS | Status: DC | PRN
  Administered 2024-02-21: 80 ug/kg/min via INTRAVENOUS

## 2024-02-21 NOTE — Progress Notes (Signed)
 PROGRESS NOTE    Chase Combs  FMW:[id number] DOB: 1946/02/10 DOA: 01/22/2024 PCP: Wonda Worth SQUIBB, PA  Subjective: Pt seen and examined. Pt now passing both gas and liquid stools. Remains in afib with occ fast rates and on IV amio.  Today is his 30th day in the hospital.   Hospital Course: CC: weakness, emesis HPI: 78 year old man w/ hx of ischemic cardiomyopathy and very recent LAD stent placement for NSTEMI discharged 8/27 (no d/c summary so unclear) with progressive nausea vomiting with CT images concerning for ischemic colitis.   Feeling weak and unwell since discharge 3 days ago.  Progressive nausea and vomiting not keeping down his antiplatelet medicines.  CT images reveal intestinal air in the wall of the intestine as well as concerning findings of ileus.  His lactate was elevated.  He was volume resuscitated.  Remained hypotensive.  Placed on broad-spectrum antibiotics he received vancomycin  Zosyn  doxycycline and Flagyl .  Labs notable for acute renal failure.  Anion gap metabolic acidosis.  Leukopenia, lymphopenia.  Significant Events: Admitted 01/22/2024 to ICU by PCCM due to intestinal pneumatosis concerning for ischemic colitis 01-24-2024 to 01-27-2024 eneral surgery recommended conservative management.   Concern patient not able to absorb antiplatelet therapy, he was placed on IV cangrelor .  Patient continue to be hypotensive despite IV fluids and was placed on norepinephrine  infusion, consistent with septic shock due to abdominal source/ ischemic colitis.  during hospitalization required PRBC transfusion due to blood loss anemia.   There was concerns of in-stent thrombosis from recent PCI therefore antiplatelets were switched from Cangrelor  to Plavix  and started on milrinone  drip due to cardiogenic shock.   Developed atrial fibrillation with rapid ventricular response, placed on IV amiodarone .  Transition to Aggrastat for anticoagulation.  09/05 GI consulted, clinical picture  consistent with colonic ileus, Ogilvie syndrome.  09/08 PRBC transfusion x1, transitioned to clopidogrel .  09/09 chest pain with new antero lateral ST elevations. Underwent urgent cardiac catheterization, LAD with ostially occluded stent with acute stent thrombosis (Culprit vessel). Successful percutaneous coronary intervention ostial proximal LAD, PTCA with balloon dilatation.  Significant volume overloaded with low SV02 and CVP very high, placed on IV furosemide  and milrinone .  09/10 completed antibiotic course.  09/10 EGD with esophageal ulcer with no bleeding and no stigmata of recent bleeding.  LA grade C reflux esophagitis with no bleeding.  Duodenal erosions without bleeding.  Flexible sigmoidoscopy positive blood in the rectum, in the sigmoid colon, in the descending colon and in the transverse colon. No obvious source of bleed.  Diverticulosis in the sigmoid colon and in the descending colon. No active bleeding.  A single superficial ulcer in the rectum. No active bleeding.   09/12 transition to ticagrelor .  Slowly weaned off TPN, diet as tolerated.   09/19 continue colonic pseudo obstruction, imaging with progression of colonic distention. Started on rifaximin .  Pending cardioversion until bowel function improves.  09/21 pending direct current cardioversion.  09/22 CT abdomen and pelvis dilated loops of small bowel and colon with air fluid levels in the distal colon and rectum. No obstruction. Appearance consistent with ileus or functional bowel disturbance.  09/23 trial of neostigmine   02-18-2024 persistent colonic pseudo obstruction despite several days of SQ neostigmine  and 1 dose of IV neostigmine . All without improvement in his symptoms. Abd XR continue to show dilated colon.  Pt taken for decompression colonoscopy with colonic decompression tube placed 02-20-2024 significant improvement in abd distension. Pt now having multiple liquid BM.  Pt remains on IV  amiodarone  and eliquis  for rapid  afib.  Admission Labs: Na 132, K 4.5, CO2 of 31, BUN 64, scr 2.55, glu 197 T prot 6.2, alb 3.0, AST 29, ALT 31, alk pho 64, t. Bili 1.4 WBC 2.4, HgB 13.9, plt 489 BNP 220 Procalcitonin 1.23  Admission Imaging Studies: CXR Low lung volumes with bibasilar linear and patchy opacities. 2. Small stable bilateral pleural effusion. 3. Elevated right hemidiaphragm. CT chest/abd/pelvis Significantly improved multifocal, bilateral pneumonia since 01/14/24. Persistent airspace and ground glass opacities in the right lower lobe, left lower lobe, and posterior left upper lobe/lingula, likely residual pneumonia or aspiration. 2. Gas along the wall of the ascending colon and cecum, suspicious for pneumatosis. Findings called to PA [place] at 3:19 pm. 3. Fluid-filled colon suggests a diarrheal state. Diffuse colonic and small bowel distention likely represent Ileus. 4. Increased small volume pelvic and new right-sided pericolonic trace fluid, likely secondary to the colonic proces. 5. Incidental findings, including: Pulmonary artery enlargement suggests pulmonary arterial hypertension. Aortic and coronary artery atherosclerosis. Right nephrolithiasis. Suspicion of minimal chronic calcific pancreatitis.  Significant Labs:   Significant Imaging Studies: 01-30-2024 CTA GI bleed Severe narrowing is noted at origin of celiac artery with poststenotic dilatation. No definite evidence of contrast extravasation to suggest gastrointestinal bleeding. Mild wall and fold thickening of ascending colon is noted suggesting possible infectious or inflammatory colitis 02-02-2024 echo LVEF 20-25%. eft ventricle has severely decreased function  02-14-2024 CT abd/pelvis Dilated loops of small bowel and colon with air-fluid levels in the distal colon and rectum indicating diarrheal process. No lead point for obstruction is identified. No pneumatosis or free air. Presumably the appearance is due to ileus or functional bowel  disturbance. 2. 0.3 cm right kidney lower pole nonobstructive stone. 3. Multilevel lumbar degenerative facet arthropathy with multilevel foraminal impingement. 4. Umbilical hernia contains adipose tissue. 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 5. High density in the gallbladder probably from sludge. 6. Mild bibasilar atelectasis. 7. Aortic Atherosclerosis (ICD10-I70.0). Coronary atherosclerosis. Prominent cardiomegaly.  Antibiotic Therapy: Anti-infectives (From admission, onward)    Start     Dose/Rate Route Frequency Ordered Stop   02/11/24 1000  rifaximin  (XIFAXAN ) tablet 550 mg        550 mg Oral 3 times daily 02/11/24 0908     02/01/24 1645  cefTRIAXone  (ROCEPHIN ) 2 g in sodium chloride  0.9 % 100 mL IVPB  Status:  Discontinued        2 g 200 mL/hr over 30 Minutes Intravenous Every 24 hours 02/01/24 1553 02/03/24 1029   01/29/24 2200  metroNIDAZOLE  (FLAGYL ) tablet 500 mg  Status:  Discontinued        500 mg Oral Every 12 hours 01/29/24 1627 02/03/24 1029   01/22/24 2200  piperacillin -tazobactam (ZOSYN ) IVPB 3.375 g        3.375 g 12.5 mL/hr over 240 Minutes Intravenous Every 8 hours 01/22/24 1602 01/28/24 2359   01/22/24 1800  metroNIDAZOLE  (FLAGYL ) IVPB 500 mg  Status:  Discontinued        500 mg 100 mL/hr over 60 Minutes Intravenous Every 8 hours 01/22/24 1653 01/23/24 0857   01/22/24 1545  metroNIDAZOLE  (FLAGYL ) IVPB 500 mg  Status:  Discontinued        500 mg 100 mL/hr over 60 Minutes Intravenous  Once 01/22/24 1533 01/22/24 1634   01/22/24 1330  doxycycline (VIBRAMYCIN) 100 mg in sodium chloride  0.9 % 250 mL IVPB  Status:  Discontinued        100 mg 125 mL/hr  over 120 Minutes Intravenous  Once 01/22/24 1320 01/22/24 1649   01/22/24 1315  azithromycin (ZITHROMAX) 500 mg in sodium chloride  0.9 % 250 mL IVPB  Status:  Discontinued        500 mg 250 mL/hr over 60 Minutes Intravenous  Once 01/22/24 1309 01/22/24 1319   01/22/24 1315  ceFEPIme  (MAXIPIME ) 2 g in  sodium chloride  0.9 % 100 mL IVPB        2 g 200 mL/hr over 30 Minutes Intravenous  Once 01/22/24 1309 01/22/24 1515   01/22/24 1315  vancomycin  (VANCOREADY) IVPB 1750 mg/350 mL  Status:  Discontinued        1,750 mg 175 mL/hr over 120 Minutes Intravenous  Once 01/22/24 1311 01/22/24 1756       Procedures: 02-01-2024 LHC LM: Distal 45% stenosis,  LAD: Ostially occluded stent with acute stent thrombosis (Culprit vessel),  [place]: 50% in-stent restenosis,  Lcx: Mild diffuse disease,  RCA: Not engaged today. LVEDP 27 mmHg.  Successful percutaneous coronary intervention ostial prox LAD.  PTCA with serial balloon dilatation, up to 3.0  X 12 mm [place] balloon up to 20 atm 02-18-2024 endoscopic colonic decompression with tube placement  Consultants: PCCM Cardiology General surgery    Assessment and Plan: * Pneumatosis intestinalis of large intestine-resolved as of 02/16/2024 01-22-2024 to 01-29-2024.  admitted to ICU by PCCM. General surgery consulted. CT abd showed pneumatosis. Surgery did not feel that pt was a operative candidate. IV abx started. Pt developed gastric distension and ileus.  Due to N/V and ileus, NG tube was placed for gastric decompression. By 01-26-2024, abd distension improved, NG tube removed and clear liquid diet started.   AKI (acute kidney injury) 01-22-2024 through 01-28-2024. Admitted with AKI with Scr of 2.55. with IVF and treatment of his ileus/intestinal pneumatosis, his scr return to 1.07 by 01-28-2024  02-08-2024 through 02-11-2024 he develops another AKI during his cardiogenic shock. By 02-10-2024, scr back to 1.06  02-13-2024 scr back on the rise again after uncontrolled afib and starting of aldcatone/demadex .  02/17/24 Scr stable at 1.4 - 1.5 on demadex  and aldactone .  02/18/24 Scr stable at 1.4.  02/19/24 Scr up to 1.59. cards wants to continue demadex /aldactone  despite rising Scr.   02/20/24 Scr improved to 1.39 today. Was 1.59 yesterday.  Pt received multiple runs of  IV KCL and IV Mg yesterday which were given with carrier   02/21/24 Scr 1.55 today.  His baseline Scr of 0.7-0.8. AKI cause is multifactorial from cardiogenic shock, acute CHF, rapid afib. More recently, his Scr has been uptrending with continue demadex /aldactone . Cardiology continues to recommend continued treatment with demadex /aldactone . Defer to cards on diuretic management and how it impacts his AKI.   Atrial fibrillation with RVR (HCC) 02-04-2024 pt started on digoxin  and IV amiodarone  for rate control as pt's blood pressure too low to start any other rate controlled meds due to cardiogenic shock.  02-05-2024 through 02-15-2024 afib rate remains uncontrolled. Due to ongoing colonic ileus, cardiology does not want to cardiovert.  02-16-2024 remains on IV amiodarone  gtts. Rate still in the mid 100s. SBP borderline low at 90-100s  02/17/24 pt remains on IV amiodarone  and eliquis . HR still uncontrolled. Cardiology managing his rapid afib.  02/18/24 on digoxin .  remains on IV amiodarone  and eliquis . Cards managing afib. Potential for DCCV after colonic issues resolved.  02/19/24 remains on IV amiodarone  gtts and eliquis . Cards thinking about possible DCCV on Monday. Attempt to keep serum K >4.0 and Mg > 2.0  02/20/24 continue IV amiodarone . With his frequent liquid Bms, having a hard time keeping up with his potassium losses. Will give more IV and po kcl today. Mg level looks ok. Cards looking to see if pt can get DCCV tomorrow. Will make him NPO after MN in case.  02/21/24 cards to decide if pt needs DCCV today. Colonic ileus seems to have resolved. Remains on Eliquis  and IV amiodarone .   Colonic pseudoobstruction 01-27-2025 GI consulted due to colonic ileus that was not responding to conservative measures. Pt has persistent colonic distension.  GI recommended to continue Flexiseal and to treat empiric treatment with flagyl  for presumed SIBO(small intestine bacterial  overgrowth).  02-01-2024 pt develops rectal bleeding and drops his HgB from 9.2 g/dl to 7.3 g/dl.  02-02-2024 pt taken for flex sigmoidoscopy and EGD that shows bright red blood in the colon from the rectum to the transverse colon underlying mucosa appeared normal few diverticuli noted in the left and sigmoid no active bleeding, single nonbleeding superficial ulcer in the rectum.  EGD showed superficial esophageal ulcer with no bleeding, grade C esophagitis with no bleeding and a few erosions in the first portion of the duodenum  02-03-2024 to 02-10-2024 persistent abd distension despite liquid bowel movements.    02-11-2024 Xifaxan  started for additional SIBO treatment due to persistent abdominal distension.   02-15-2024 after 72 hours of no improvement with treatment with Xifaxan  for SIBO, pt started on SQ neostigmine  with IV robinul  to help with colonic decompression.  02-16-2024 GI continuing with SQ neostigmine  q6h.  02-17-2024 GI managing colonic ileus. Abd XR ordered. Pt may need endoscopic decompression. GI has made pt NPO.  02/18/24 Pt states he has not had any BM overnight. Appears he was given 1 mg IV neostigmine  instead of SQ injection. He states he was extremely nauseated with IV neostigmine . Today, Abd XR still show extremely dilated colon. XR no changes from yesterday.  02-19-2024 s/p colonoscopy with decompression tube placed yesterday. Neostigmine  stopped on 02-18-2024.  02/20/24 still doing well. Having liquid Bms. Abd remains soft. GI to decide when colonic decompression tube can be removed.  02/21/24 still with colonic decompression tube. Passing flatus and liquid stools. Abd is nice and soft now.   Small intestinal bacterial overgrowth (SIBO) 01-27-2025 GI consulted due to colonic ileus that was not responding to conservative measures. Pt has persistent colonic distension.  GI recommended to continue Flexiseal and to treat empiric treatment with flagyl  for presumed  SIBO(small intestine bacterial overgrowth).   02-03-2024 to 02-10-2024 persistent abd distension despite liquid bowel movements.     02-11-2024 Xifaxan  started for additional SIBO treatment due to persistent abdominal distension.    02-15-2024 after 72 hours of no improvement with treatment with Xifaxan  for SIBO, pt started on IV neostigmine  with IV robinul  to help with colonic decompression.   02-16-2024 GI continuing with SQ neostigmine  q6h.  02/17/24 remains on Xifaxan  TID. GI treating him for 14 days. End date 02-24-2024. During prior admission, flagyl  seemed to work but no longer.  02/18/24 continues with Xifaxan  TID. End date of therapy 02-24-2024.  02/19/24 continues on Xifaxan . End date of therapy 02-24-2024. This will completed 14 days of treatment.  02/20/24 on Xifaxan . End date 02-24-2024. This is 14 days of therapy.     Acute on chronic systolic CHF (congestive heart failure) (HCC) 02-02-2024 pt develops cardiogenic shock after STEMI requiring PTCA. Pt placed on milrinone  for cardiovascular support.  02-04-2024 through 02-09-2024 pt started on IV lasix  gtts for diuresis while  he has cardiogenic shock to help remove intravascular volume  02-14-2024 po demadex  and aldactone  started.  02/17/24 stable on po demadex  and aldactone . Scr 1.4-1.5 over the last 4-5 days.  02/18/24 stable. On demadex  and aldactone .  02/19/24 Scr up to 1.59. cards wants to continue demadex /aldactone  despite rising Scr.  02/20/24 Scr 1.39, BUN 34 today. Pt received multiple runs of IV KCL and IV Mg yesterday which were given with carrier  IVF. Cards wants to continue demadex /aldactone .  02/21/24 Scr at 1.55 today. Consistent with mild AKI. Cards continues to want demadex /aldactone .  02/21/24 AKI persistent with Scr 1.55. management of GDMT per cards.      Hypokalemia 02/18/24 K 3.4 today. Given rapid afib and NPO status. Will give IV Kcl replacement.  02/19/24 K 3.5 today. Will give more KCL  due to afib.  02/20/24 K 3.3 today. Give po and IV kcl. Will add scheduled doses of Kdur in the evening.  02/21/24 finally, K >4.0 today. K 4.4 today. Continue with kcl 40 meq doses at 6 pm and 10 pm at nightime.     Cardiogenic shock (HCC)-resolved as of 02/16/2024 02-01-2024 after his STEMI from in-stent restenosis requiring PCTA on 02-01-2024. Pt develops cardiogenic shock requiring IV milrinone  and transfer to CICU.  02-02-2024 to 02-09-2024 echo shows LVEF dropped to 20-25%. Pt kept on milrinone  until 02-09-2024 when it was eventually weaned off.  Anemia due to acute blood loss-resolved as of 02/16/2024 GI bleed.   SP 4 units PRBC transfusion.   EGD with flexible sigmoidoscopy with red blood in the colon.  Grade 1 esophagitis.  Continue pantoprazole .  Follow up hgb is 11.3   Acute blood loss anemia-resolved as of 02/16/2024 02-01-2024 pt develops rectal bleeding and drops his HgB from 9.2 g/dl to 7.3 g/dl.  02-02-2024 pt transfused 2 units PRBC.  pt taken for flex sigmoidoscopy and EGD that shows bright red blood in the colon from the rectum to the transverse colon underlying mucosa appeared normal few diverticuli noted in the left and sigmoid no active bleeding, single nonbleeding superficial ulcer in the rectum.  EGD showed superficial esophageal ulcer with no bleeding, grade C esophagitis with no bleeding and a few erosions in the first portion of the duodenum  Gastrointestinal hemorrhage-resolved as of 02/16/2024 02-01-2024 pt develops rectal bleeding and drops his HgB from 9.2 g/dl to 7.3 g/dl.   02-02-2024 pt taken for flex sigmoidoscopy and EGD that shows bright red blood in the colon from the rectum to the transverse colon underlying mucosa appeared normal few diverticuli noted in the left and sigmoid no active bleeding, single nonbleeding superficial ulcer in the rectum.  EGD showed superficial esophageal ulcer with no bleeding, grade C esophagitis with no bleeding and a few  erosions in the first portion of the duodenum  Type 2 diabetes mellitus with hyperlipidemia (HCC) 02-16-2024 CBG stable without SSI  02/17/24 CBG stable  02/18/24 stable CBG  02/19/24 CBG stable.  02/20/24 stable  02/21/24 stable    Pressure injury of skin Wound 02/01/24 1600 Pressure Injury Buttocks Stage 2 -  Partial thickness loss of dermis presenting as a shallow open injury with a red, pink wound bed without slough. (Active)    Malnutrition of moderate degree Nutrition Status: Nutrition Problem: Moderate Malnutrition Etiology: acute illness Signs/Symptoms: energy intake < 75% for > 7 days, moderate muscle depletion, mild muscle depletion Interventions: Boost Breeze, MVI, Refer to RD note for recommendations    Essential hypertension 02-17-2024 unable to start HTN meds  due to low BP  02/18/24 BP still borderline low due to rapid afib.  02/19/24 BP low today. Cannot be on any further HTN meds due to low BP.  02/20/24 BP too low to add any additional meds. Maybe if he is back in NSR, his BP will increase  02/21/24 unable to add any additional HTN meds due to low BP.   Coronary artery disease involving native coronary artery of native heart without angina pectoris 09-09--2025 pt has in-stent thrombosis causing STEMI. Taken to cath lab for [facility] to PTCA.  02-16-2024 pt on Brilinta . ASA on hold due to GI bleed on 02-02-2024.  Restart crestor  20 mg daily.  02/17/24 remains on Brilinta  and Eliquis . No ASA due to GI bleeding on 02-02-2024.  02/18/24 stable on Eliquis  and Brilinta . Cards holding on ASA due to GI bleeding on 02-02-2024  02/19/24 on Eliquis  and Brilinta . ASA on hold due to GI bleeding on 02-02-2024.  02/20/24 on Brilinta  and Eliquis . Off ASA due to GI bleeding on 02-02-2024.  02/21/24 pt's plavix  assay came back as he is a rapid metabolizer of plavix . Cards to determine how that impacts his CAD.     ST elevation myocardial infarction involving left main  coronary artery (HCC)-resolved as of 02/16/2024 02-01-2024 At around 1:15 PM, patient started having right shoulder pain and started feeling diaphoretic. EKG showed new anterolateral ST elevation. Cardiology felt that pt likely had occluded his recently placed LAD stent with several days of likely suboptimal platelet inhibition. Pt taken emergently to cath lab where LHC showed LAD: Ostially occluded stent with acute stent thrombosis. Pt subsequently develops cardiogenic shock and requires IV milrinone  for 7 days.   DNR (do not resuscitate) Made DNR/DNI on 01-29-2024 by palliative care        DVT prophylaxis: Place and maintain sequential compression device Start: 02/04/24 1132 Place TED hose Start: 02/04/24 0941 SCD's Start: 02/01/24 1555 apixaban  (ELIQUIS ) tablet 5 mg     Code Status: Do not attempt resuscitation (DNR) PRE-ARREST INTERVENTIONS DESIRED Family Communication: no family at bedside Disposition Plan: unknown Reason for continuing need for hospitalization: remains on IV amiodarone  and colonic decompression tube.  Objective: Vitals:   02/21/24 0532 02/21/24 0700 02/21/24 0805 02/21/24 1100  BP:  95/62 (!) 105/91 111/83  Pulse: (!) 189 (!) 222 (!) 206   Resp: 15 12 (!) 26 (!) 26  Temp:  98 F (36.7 C)  98 F (36.7 C)  TempSrc:  Oral  Oral  SpO2: 93% 97% 90% 96%  Weight: 77.9 kg     Height:        Intake/Output Summary (Last 24 hours) at 02/21/2024 1158 Last data filed at 02/20/2024 1847 Gross per 24 hour  Intake 150 ml  Output 600 ml  Net -450 ml   Filed Weights   02/19/24 0500 02/20/24 0300 02/21/24 0532  Weight: 79.4 kg 77.7 kg 77.9 kg    Examination:  Physical Exam Vitals and nursing note reviewed.  Constitutional:      General: He is not in acute distress.    Appearance: He is not toxic-appearing.     Comments: Appears chronically ill  HENT:     Head: Normocephalic and atraumatic.  Cardiovascular:     Rate and Rhythm: Tachycardia present. Rhythm  irregular.  Pulmonary:     Effort: Pulmonary effort is normal. No respiratory distress.     Breath sounds: Normal breath sounds.  Abdominal:     General: Bowel sounds are normal. There is no distension.  Palpations: Abdomen is soft.     Comments: +colonic decompression tube  Musculoskeletal:     Comments: Trace to +1 pitting bilateral pretibial, ankle/pedal edema  Skin:    General: Skin is warm and dry.     Capillary Refill: Capillary refill takes less than 2 seconds.  Neurological:     General: No focal deficit present.     Mental Status: He is alert and oriented to person, place, and time.     Data Reviewed: I have personally reviewed following labs and imaging studies  CBC: Recent Labs  Lab 02/17/24 0510 02/18/24 0440 02/19/24 0400 02/20/24 0537 02/21/24 0550  WBC 4.4 4.3 4.5 6.5 9.0  HGB 11.2* 10.9* 10.2* 10.7* 11.4*  HCT 34.9* 34.2* 31.4* 33.8* 35.5*  MCV 87.5 87.9 88.5 88.9 88.8  PLT 698* 649* 623* 567* 568*   Basic Metabolic Panel: Recent Labs  Lab 02/15/24 0505 02/16/24 0515 02/17/24 0510 02/18/24 0440 02/19/24 0400 02/20/24 0537 02/21/24 0550  NA 133*   < > 134* 133* 133* 133* 134*  K 3.8   < > 3.3* 3.4* 3.5 3.3* 4.4  CL 98   < > 100 97* 100 102 107  CO2 21*   < > 23 21* 19* 19* 17*  GLUCOSE 139*   < > 134* 228* 235* 197* 113*  BUN 37*   < > 41* 39* 41* 34* 34*  CREATININE 1.41*   < > 1.47* 1.40* 1.59* 1.39* 1.55*  CALCIUM  9.0   < > 8.7* 8.1* 8.1* 8.1* 8.5*  MG 2.2  --   --  2.1 2.0 2.2 2.2  PHOS  --   --   --   --  4.3  --   --    < > = values in this interval not displayed.   GFR: Estimated Creatinine Clearance: 44.5 mL/min (A) (by C-G formula based on SCr of 1.55 mg/dL (H)). Liver Function Tests: Recent Labs  Lab 02/17/24 0510 02/18/24 0440 02/19/24 0400 02/20/24 0537 02/21/24 0550  AST 14* 13* 13* 12* 15  ALT 15 15 13 13 14   ALKPHOS 73 68 69 64 71  BILITOT 0.4 0.8 0.5 0.6 0.7  PROT 6.2* 5.8* 5.4* 5.6* 5.9*  ALBUMIN 2.9* 2.7* 2.6*  2.6* 2.8*   BNP (last 3 results) Recent Labs    01/11/24 0539 01/13/24 0537 01/22/24 1243  BNP 842.8* 1,747.3* 220.4*   CBG: Recent Labs  Lab 02/15/24 1559 02/15/24 2100 02/16/24 0601 02/16/24 2058 02/18/24 2122  GLUCAP 116* 131* 116* 166* 148*   Radiology Studies: DG Abd 1 View Result Date: 02/20/2024 EXAM: 1 VIEW XRAY OF THE ABDOMEN 02/20/2024 09:46:00 AM COMPARISON: 02/19/2024 CLINICAL HISTORY: pt order states ileus. Pt order comments state Do portable, assess ileus, position of decompression tube FINDINGS: LINES, TUBES AND DEVICES: Percutaneous enteric tube in place with tip in the proximal transverse colon. Rectal tube in place. BOWEL: Nonobstructive bowel gas pattern. Decreased right colon distention. SOFT TISSUES: No opaque urinary calculi. BONES: No acute osseous abnormality. IMPRESSION: 1. Decreased right colon distention, consistent with improving ileus. Electronically signed by: Lonni Necessary MD 02/20/2024 10:32 AM EDT RP Workstation: [id number]    Scheduled Meds:  apixaban   5 mg Oral BID   Chlorhexidine  Gluconate Cloth  6 each Topical Q0600   digoxin   0.0625 mg Oral Daily   feeding supplement  1 Container Oral TID BM   Gerhardt's butt cream   Topical BID   magic mouthwash  5 mL Oral QID   melatonin  5 mg Oral QHS   multivitamin with minerals  1 tablet Oral Daily   nystatin    Topical BID   pantoprazole   40 mg Oral BID   potassium chloride   40 mEq Oral BID   rifaximin   550 mg Oral TID   rosuvastatin   20 mg Oral Daily   sodium chloride  flush  10-40 mL Intracatheter Q12H   sodium chloride  flush  3 mL Intravenous Q12H   spironolactone   25 mg Oral Daily   ticagrelor   90 mg Oral BID   torsemide   40 mg Oral Daily   Continuous Infusions:  amiodarone  30 mg/hr (02/21/24 0009)     LOS: 30 days   Time spent: 55 minutes  Camellia Door, DO  [facility]  02/21/2024, 11:58 AM

## 2024-02-21 NOTE — Progress Notes (Signed)
  Pt arrived from TEE/CV via Bed to HA. Report received from RN and CRNA. Pt vitals are stable, performed q38min see vitals flowsheet. Anesthesia recovery has been uneventful. Pt to transition back to 2C. Will continue to monitor patient while under holding area care.

## 2024-02-21 NOTE — Transfer of Care (Signed)
 Immediate Anesthesia Transfer of Care Note  Patient: Chase Combs  Procedure(s) Performed: CARDIOVERSION TRANSESOPHAGEAL ECHOCARDIOGRAM  Patient Location: PACU  Anesthesia Type:MAC  Level of Consciousness: drowsy  Airway & Oxygen Therapy: Patient Spontanous Breathing and Patient connected to nasal cannula oxygen  Post-op Assessment: Report given to RN and Post -op Vital signs reviewed and stable  Post vital signs: Reviewed and stable  Last Vitals:  Vitals Value Taken Time  BP 92/61 02/21/24 16:23  Temp 36.4 C 02/21/24 16:23  Pulse 65 02/21/24 16:23  Resp 16 02/21/24 16:23  SpO2 98 % 02/21/24 16:23    Last Pain:  Vitals:   02/21/24 1623  TempSrc: Temporal  PainSc: Asleep      Patients Stated Pain Goal: 0 (02/13/24 2338)  Complications: There were no known notable events for this encounter.

## 2024-02-21 NOTE — CV Procedure (Signed)
   TRANSESOPHAGEAL ECHOCARDIOGRAM GUIDED DIRECT CURRENT CARDIOVERSION  NAME:  Chase Combs   MRN: [id number] DOB:  1946-01-17   ADMIT DATE: 01/22/2024  INDICATIONS:  Atrial fibrillation  PROCEDURE:   Informed consent was obtained prior to the procedure. The risks, benefits and alternatives for the procedure were discussed and the patient comprehended these risks.  Risks include, but are not limited to, cough, sore throat, vomiting, nausea, somnolence, esophageal and stomach trauma or perforation, bleeding, low blood pressure, aspiration, pneumonia, infection, trauma to the teeth and death.    After a procedural time-out, the oropharynx was anesthetized and the patient was sedated by the anesthesia service. The transesophageal probe was inserted in the esophagus and stomach without difficulty and multiple views were obtained.   FINDINGS:  LEFT VENTRICLE: EF = 20-25% global HK  RIGHT VENTRICLE: Moderately HK  LEFT ATRIUM: Moderately dilated  LEFT ATRIAL APPENDAGE: No clot  RIGHT ATRIUM: Normal  AORTIC VALVE:  Trileaflet. Mildly calcified Mild AI  MITRAL VALVE:  Normal   Mild to mod MR  TRICUSPID VALVE: Normal Mild TR  PULMONIC VALVE: Normal trivial PI  INTERATRIAL SEPTUM: + lipomatous hypertrophy. No PFO.   PERICARDIUM: Trivial effusion   DESCENDING AORTA: Not visualize   CARDIOVERSION:     Indications:  Atrial Fibrillation  Procedure Details:  Once the TEE was complete, the patient had the defibrillator pads placed in the anterior and posterior position. Once an appropriate level of sedation was achieved, the patient received a single biphasic, synchronized 200J shock with prompt conversion to sinus rhythm. No apparent complications.   Toribio Fuel, MD  4:20 PM

## 2024-02-21 NOTE — Progress Notes (Signed)
 OT Cancellation Note  Patient Details Name: Chase Combs MRN: [id number] DOB: 1945/12/12   Cancelled Treatment:    Reason Eval/Treat Not Completed: Medical issues which prohibited therapy. Pt with tachycardia, cardiology requests cancellation of therapy until after cardioversion.   [name] 02/21/2024, 10:08 AM Mliss HERO, OTR/L Acute Rehabilitation Services Office: [phone]

## 2024-02-21 NOTE — Plan of Care (Signed)

## 2024-02-21 NOTE — Plan of Care (Signed)

## 2024-02-21 NOTE — Telephone Encounter (Signed)
 Called patient on 01/11/2024 and 02/01/2024  To schedule the following:  TYPE OF SURGERY: IMPLANT PLACEMENT D6010 TEETH/QUAD OR # OF IMPLANTS INVOLVED: EXT #9 implant #8,10 BLC DATE REQUESTED (IF PREARRANGED): pt requested after September 16th (can be with any surgical faculty) LENGTH OF PROCEDURE: 3 HOURS (180 minutes)  AMOUNT TO COLLECT: $3,256 RESOURCES NEEDED: STR BLT GUIDED and VERSAH BURS Prosth EXT kit  Additional Collection for Today (if applicable): $ NAA Patient insurance: Self pay  Do you have a Neurosurgeon (provide name)? No If you have IV who is 3rd person? n/a Is this a Perio/Pros shared patient? no Are they assisting at this appt? no

## 2024-02-21 NOTE — Progress Notes (Signed)
  Echocardiogram Echocardiogram Transesophageal has been performed.  Chase Combs 02/21/2024, 4:29 PM

## 2024-02-21 NOTE — Progress Notes (Addendum)
 Advanced Heart Failure Rounding Note  Cardiologist: Alm Clay, MD  HF Consulting Cardiologist: Dr. Zenaida  Chief Complaint: Acute on chronic HFrEF Subjective:    9/9 PTCA LAD d/t stent thrombosis. A fib w/ RVR. CO-OX 44 %. Started on milrinone  0.25 mcg.  9/10:  EGD and flex sig 9/10.  EGD with 1 superficial esophageal ulcer, LA grade C reflux esophagitis with no bleeding. Flex sig: Red blood in the rectum, sigmoid colon, descending colon and transverse colon. Few diverticula found. No obvious source of bleeding.  9/18 Milrinone  stopped.  9/28: NGT removed, ileus resolved.   CT A/P w/o on 9/22 w/ dilated loops of small bowel and colon w/ air fluid levels in distal colon and rectum w/ obstruction or pneumatosis.  Co-ox 57%. CVP 5-7  Feeling anxious this morning, lying in bed. No shortness of breath but some jittering.   Objective:    Weight Range: 77.9 kg Body mass index is 23.29 kg/m.   Vital Signs:   Temp:  [98 F (36.7 C)-98.4 F (36.9 C)] 98 F (36.7 C) (09/29 0700) Pulse Rate:  [95-222] 206 (09/29 0805) Resp:  [12-26] 26 (09/29 0805) BP: (92-111)/(62-91) 105/91 (09/29 0805) SpO2:  [90 %-97 %] 90 % (09/29 0805) Weight:  [77.9 kg] 77.9 kg (09/29 0532) Last BM Date : 02/19/24  Weight change: Filed Weights   02/19/24 0500 02/20/24 0300 02/21/24 0532  Weight: 79.4 kg 77.7 kg 77.9 kg   Intake/Output:  Intake/Output Summary (Last 24 hours) at 02/21/2024 1004 Last data filed at 02/20/2024 1847 Gross per 24 hour  Intake 150 ml  Output 600 ml  Net -450 ml    Physical Exam   General: Elderly appearing. No distress on RA Cardiac: JVP flat. S1 and S2 present. No murmurs or rub. Resp: Lung sounds clear and equal B/L Abdomen: Soft, non-tender, non-distended.  Extremities: Warm and dry.  No peripheral edema.  Neuro: Alert and oriented x3. Affect pleasant.   Telemetry   AF 90-110s (personally reviewed)  Labs    CBC Recent Labs    02/20/24 0537  02/21/24 0550  WBC 6.5 9.0  HGB 10.7* 11.4*  HCT 33.8* 35.5*  MCV 88.9 88.8  PLT 567* 568*   Basic Metabolic Panel Recent Labs    90/72/74 0400 02/20/24 0537 02/21/24 0550  NA 133* 133* 134*  K 3.5 3.3* 4.4  CL 100 102 107  CO2 19* 19* 17*  GLUCOSE 235* 197* 113*  BUN 41* 34* 34*  CREATININE 1.59* 1.39* 1.55*  CALCIUM  8.1* 8.1* 8.5*  MG 2.0 2.2 2.2  PHOS 4.3  --   --    Liver Function Tests Recent Labs    02/20/24 0537 02/21/24 0550  AST 12* 15  ALT 13 14  ALKPHOS 64 71  BILITOT 0.6 0.7  PROT 5.6* 5.9*  ALBUMIN 2.6* 2.8*    Medications:   Scheduled Medications:  apixaban   5 mg Oral BID   Chlorhexidine  Gluconate Cloth  6 each Topical Q0600   digoxin   0.0625 mg Oral Daily   feeding supplement  1 Container Oral TID BM   Gerhardt's butt cream   Topical BID   magic mouthwash  5 mL Oral QID   melatonin  5 mg Oral QHS   multivitamin with minerals  1 tablet Oral Daily   nystatin    Topical BID   pantoprazole   40 mg Oral BID   potassium chloride   40 mEq Oral BID   rifaximin   550 mg Oral TID  rosuvastatin   20 mg Oral Daily   sodium chloride  flush  10-40 mL Intracatheter Q12H   sodium chloride  flush  3 mL Intravenous Q12H   spironolactone   25 mg Oral Daily   ticagrelor   90 mg Oral BID   torsemide   40 mg Oral Daily    Infusions:  amiodarone  30 mg/hr (02/21/24 0009)     PRN Medications: acetaminophen , ALPRAZolam , atropine , diphenhydrAMINE , guaiFENesin -dextromethorphan , ipratropium-albuterol , metoCLOPramide  (REGLAN ) injection, mouth rinse, polyethylene glycol, simethicone , sodium chloride  flush, sodium chloride  flush  Patient Profile   Mr Tennell is a 78 year old with a history of ICM, chronic biventricular HFrEF, CAD s/p PCI to LAD in 08/25.      Readmitted 01/22/24 with abdominal pain/nausea/vomiting. Hospital course complicated by ischemic colitis w/ septic shock/GI bleed, stent thrombosis involving recent LAD stent and low-output HF.   Assessment/Plan    CAD, recent STEMI d/t stent thrombosis - 01/17/24- PCI/DES LAD  - 02/01/24 Cangrelor  stopped given Plavix  load and ST elevation. Acute stent thrombosis involving LAD stent treated with PTCA. - Plavix  Genetic Testing collected, pending (sent 9/10, checked with lab 9/22 and still in process) - stable w/o CP  - Continue ticagrelor  90mg  BID.  - Off aspirin  with GI bleed.  - Continue high-intensity statin.   New A fib RVR - Continue amiodarone  30/hr. Rate 110s today.  - Given his low output HF, would avoid giving additional IV beta blocker. - Continue Eliquis  5 BID. Hgb stable. - Continue digoxin  0.0625 - will plan for DCCV today.   A/C HFrEF, ICM   -01/13/24 Echo EF  20-25% RV moderately reduced.   -02/01/24 Echo LVEF 20-25% RV moderately reduced.    - CO-OX 53% with Fick CI 2.2 on 0.125 milrinone . Milrinone  stopped 9/18.  - CO-OX stable off milrinone . CVP - CVP 5-7, repeat IV Lasix  80 mg x 1 give K supp  - Off SGLT2i with fungal rash - Continue spiro 25 mg daily - Continue digoxin  0.0625 mcg daily. Dig level < 0.6. - No BP room to further titrate GDMT - Renal function stable.    Anemia in the setting of GI Bleed -Transfused 9/8 with appropriate rise.  - 9/10 he had large bloody BM. Unfortunately given 600 mg Ibuprofen .  - Protonix  and Pepcid  per GI - GI following.  - S/p EGD and flex sig.  EGD with 1 superficial esophageal ulcer, LA grade C reflux esophagitis with no bleeding. Flex sig: Red blood in the rectum, sigmoid colon, descending colon and transverse colon. Few diverticula found. No obvious source of bleeding, possibly diverticular per GI. - Hgb stable.   Intestinal pneumatosis w/ concern for ischemic colitis Ileus Psuedoobstruction - Gen surgery recommended conservative management - Treated with antibiotics for suspected septic shock. - Transverse colon measures 9.3 cm on Xray last week.  - CT A/P w/o on 09/22 w/ dilated loops of small bowel and colon w/ air fluid levels  in distal colon and rectum w/ obstruction or pneumatosis - Getting neostigmine  but poor response. Abdomen much more distended today F/u abdominal X-ray pending.  - Improved with decompression. Appreciate GI.   Hypokalemia - resolved   Code status: DNR   Length of Stay: 30  [place] Lee, NP  02/21/2024, 10:04 AM  Advanced Heart Failure Team Pager [phone] (M-F; 7a - 5p)  Please contact [facility] for night-coverage after hours (5p -7a ) and weekends on [email or web address]  Patient seen and examined with the above-signed Advanced Practice Provider and/or Housestaff. I personally reviewed laboratory data, imaging  studies and relevant notes. I independently examined the patient and formulated the important aspects of the plan. I have edited the note to reflect any of my changes or salient points. I have personally discussed the plan with the patient and/or family.   Belly feels better after decompression . No current bleeding. Denies CP or SOB but stil with some edema.   Co-ox marginal off milrinone   General:  Sitting up in bed. No resp difficulty HEENT: normal Neck: supple. JVP 6-7 Carotids 2+ bilat; no bruits. No lymphadenopathy or thryomegaly appreciated. Cor: PMI nondisplaced. Regular rate & rhythm. No rubs, gallops or murmurs. Lungs: clear Abdomen: soft, nontender, nondistended. No hepatosplenomegaly. No bruits or masses. Good bowel sounds. Extremities: no cyanosis, clubbing, rash, 2+ edema Neuro: alert & orientedx3, cranial nerves grossly intact. moves all 4 extremities w/o difficulty. Affect pleasant   He is now back in NSR> Continue Eliquis . Spoke with PharmD. He is Plavix  super responder can switch back to Plavix .   Agree with IV lasix  today. Watch co-ox closely off milrinone .  Toribio Fuel, MD  4:24 PM   Underwent successful TEE & DCCV today. EF 20-25%

## 2024-02-21 NOTE — Interval H&P Note (Signed)
 History and Physical Interval Note:  02/21/2024 4:05 PM  Chase Combs  has presented today for surgery, with the diagnosis of afib.  The various methods of treatment have been discussed with the patient and family. After consideration of risks, benefits and other options for treatment, the patient has consented to  Procedure(s): CARDIOVERSION (N/A) TRANSESOPHAGEAL ECHOCARDIOGRAM (N/A) as a surgical intervention.  The patient's history has been reviewed, patient examined, no change in status, stable for surgery.  I have reviewed the patient's chart and labs.  Questions were answered to the patient's satisfaction.     Dorell Gatlin

## 2024-02-21 NOTE — Anesthesia Preprocedure Evaluation (Addendum)
 Anesthesia Evaluation  Patient identified by MRN, date of birth, ID band Patient awake    Reviewed: Allergy & Precautions, NPO status , Patient's Chart, lab work & pertinent test results  History of Anesthesia Complications (+) PONV and history of anesthetic complications  Airway Mallampati: II  TM Distance: >3 FB Neck ROM: Full    Dental  (+) Missing,    Pulmonary sleep apnea    Pulmonary exam normal        Cardiovascular hypertension, + CAD, + Past MI, +CHF and + DOE  Normal cardiovascular exam  TTE 02/02/24: EF 20-25%, global hypokinesis, moderate LVH, moderate RV dysfunction, abnormal (paradoxical) septal motion consistent with right ventricular volume overload, moderate MR, mild to moderate TR    Neuro/Psych    GI/Hepatic negative GI ROS, Neg liver ROS,,,  Endo/Other  diabetes, Type 2    Renal/GU ARFRenal disease     Musculoskeletal  (+) Arthritis ,    Abdominal   Peds  Hematology negative hematology ROS (+)   Anesthesia Other Findings   Reproductive/Obstetrics                              Anesthesia Physical Anesthesia Plan  ASA: 4  Anesthesia Plan: General   Post-op Pain Management: Minimal or no pain anticipated   Induction: Intravenous  PONV Risk Score and Plan: Treatment may vary due to age or medical condition and Propofol  infusion  Airway Management Planned: Mask  Additional Equipment: None  Intra-op Plan:   Post-operative Plan:   Informed Consent: I have reviewed the patients History and Physical, chart, labs and discussed the procedure including the risks, benefits and alternatives for the proposed anesthesia with the patient or authorized representative who has indicated his/her understanding and acceptance.       Plan Discussed with: CRNA  Anesthesia Plan Comments:          Anesthesia Quick Evaluation

## 2024-02-21 NOTE — Progress Notes (Signed)
 Inpatient Rehab Admissions Coordinator:   Pt underwent decompression of SIBO per Dr. Albertus 9/26.  Today with pending DCCV.  Will continue to follow for stabilization and therapy assessments to best determine pt's rehab needs.   Reche Lowers, PT, DPT Admissions Coordinator [phone] 02/21/24  11:18 AM

## 2024-02-21 NOTE — Progress Notes (Signed)
 PT Cancellation Note  Patient Details Name: Chase Combs MRN: [id number] DOB: [birth year]   Cancelled Treatment:    Reason Eval/Treat Not Completed: (P) Medical issues which prohibited therapy (Defer PT tx for pt safety due to noted tachycardia HR >200 bpm in AM per chart review. Pt not yet on schedule for DCCV today.)   Drina Jobst M Talishia Betzler 02/21/2024, 10:00 AM

## 2024-02-22 ENCOUNTER — Other Ambulatory Visit (HOSPITAL_COMMUNITY): Payer: Self-pay

## 2024-02-22 ENCOUNTER — Other Ambulatory Visit (HOSPITAL_COMMUNITY): Payer: Self-pay | Admitting: Cardiology

## 2024-02-22 DIAGNOSIS — I5023 Acute on chronic systolic (congestive) heart failure: Secondary | ICD-10-CM | POA: Diagnosis not present

## 2024-02-22 DIAGNOSIS — K6389 Other specified diseases of intestine: Secondary | ICD-10-CM | POA: Diagnosis not present

## 2024-02-22 DIAGNOSIS — I4891 Unspecified atrial fibrillation: Secondary | ICD-10-CM | POA: Diagnosis not present

## 2024-02-22 DIAGNOSIS — K5981 Ogilvie syndrome: Secondary | ICD-10-CM | POA: Diagnosis not present

## 2024-02-22 DIAGNOSIS — N179 Acute kidney failure, unspecified: Secondary | ICD-10-CM | POA: Diagnosis not present

## 2024-02-22 DIAGNOSIS — R5381 Other malaise: Secondary | ICD-10-CM | POA: Diagnosis not present

## 2024-02-22 LAB — COOXEMETRY PANEL
Carboxyhemoglobin: 1.2 % (ref 0.5–1.5)
Methemoglobin: <0.7 % (ref 0.0–1.5)
O2 Saturation: 60.6 %
Total hemoglobin: 11.7 g/dL — ABNORMAL LOW (ref 12.0–16.0)

## 2024-02-22 LAB — CBC
HCT: 35.1 % — ABNORMAL LOW (ref 39.0–52.0)
Hemoglobin: 11.4 g/dL — ABNORMAL LOW (ref 13.0–17.0)
MCH: 28.4 pg (ref 26.0–34.0)
MCHC: 32.5 g/dL (ref 30.0–36.0)
MCV: 87.3 fL (ref 80.0–100.0)
Platelets: 486 K/uL — ABNORMAL HIGH (ref 150–400)
RBC: 4.02 MIL/uL — ABNORMAL LOW (ref 4.22–5.81)
RDW: 19.3 % — ABNORMAL HIGH (ref 11.5–15.5)
WBC: 8.7 K/uL (ref 4.0–10.5)
nRBC: 0 % (ref 0.0–0.2)

## 2024-02-22 LAB — COMPREHENSIVE METABOLIC PANEL WITH GFR
ALT: 15 U/L (ref 0–44)
AST: 18 U/L (ref 15–41)
Albumin: 2.7 g/dL — ABNORMAL LOW (ref 3.5–5.0)
Alkaline Phosphatase: 66 U/L (ref 38–126)
Anion gap: 13 (ref 5–15)
BUN: 25 mg/dL — ABNORMAL HIGH (ref 8–23)
CO2: 19 mmol/L — ABNORMAL LOW (ref 22–32)
Calcium: 8.3 mg/dL — ABNORMAL LOW (ref 8.9–10.3)
Chloride: 102 mmol/L (ref 98–111)
Creatinine, Ser: 1.37 mg/dL — ABNORMAL HIGH (ref 0.61–1.24)
GFR, Estimated: 53 mL/min — ABNORMAL LOW (ref >60)
Glucose, Bld: 185 mg/dL — ABNORMAL HIGH (ref 70–99)
Potassium: 3.9 mmol/L (ref 3.5–5.1)
Sodium: 134 mmol/L — ABNORMAL LOW (ref 135–145)
Total Bilirubin: 0.6 mg/dL (ref 0.0–1.2)
Total Protein: 5.6 g/dL — ABNORMAL LOW (ref 6.5–8.1)

## 2024-02-22 LAB — MAGNESIUM: Magnesium: 2 mg/dL (ref 1.7–2.4)

## 2024-02-22 MED ORDER — CLOPIDOGREL BISULFATE 75 MG PO TABS
300.0000 mg | ORAL_TABLET | Freq: Once | ORAL | Status: AC
Start: 2024-02-22 — End: 2024-02-22
  Administered 2024-02-22: 300 mg via ORAL
  Filled 2024-02-22: qty 4

## 2024-02-22 MED ORDER — MAGNESIUM SULFATE 2 GM/50ML IV SOLN
2.0000 g | Freq: Once | INTRAVENOUS | Status: AC
  Administered 2024-02-22: 2 g via INTRAVENOUS
  Filled 2024-02-22: qty 50

## 2024-02-22 MED ORDER — POTASSIUM CHLORIDE 20 MEQ PO PACK
40.0000 meq | PACK | Freq: Once | ORAL | Status: AC
  Administered 2024-02-22: 40 meq via ORAL
  Filled 2024-02-22: qty 2

## 2024-02-22 MED ORDER — AMIODARONE HCL 200 MG PO TABS
200.0000 mg | ORAL_TABLET | Freq: Two times a day (BID) | ORAL | Status: DC
  Administered 2024-02-22 – 2024-02-25 (×7): 200 mg via ORAL
  Filled 2024-02-22 (×7): qty 1

## 2024-02-22 MED ORDER — POTASSIUM CHLORIDE CRYS ER 20 MEQ PO TBCR: 40.0000 meq | EXTENDED_RELEASE_TABLET | ORAL | Status: DC

## 2024-02-22 MED ORDER — CLOPIDOGREL BISULFATE 75 MG PO TABS
75.0000 mg | ORAL_TABLET | Freq: Every day | ORAL | Status: DC
Start: 2024-02-23 — End: 2024-02-25
  Administered 2024-02-23 – 2024-02-25 (×3): 75 mg via ORAL
  Filled 2024-02-22 (×3): qty 1

## 2024-02-22 NOTE — TOC Progression Note (Signed)
 Transition of Care [facility]) - Progression Note    Patient Details  Name: Chase Combs MRN: [id number] Date of Birth: [birth year]  Transition of Care [facility]) CM/SW Contact  Justina Delcia Czar, RN Phone Number: [phone] 02/22/2024, 5:04 PM  Clinical Narrative:   CIR following for IP rehab, will begin auth.   Chart reviewed for discharge readiness, patient not medically stable for d/c.  Inpatient CM/CSW will continue to monitor pt's advancement through interdisciplinary progression rounds.   Expected Discharge Plan: Home w Home Health Services Barriers to Discharge: Continued Medical Work up               Expected Discharge Plan and Services   Discharge Planning Services: CM Consult   Living arrangements for the past 2 months: Single Family Home                                       Social Drivers of Health (SDOH) Interventions SDOH Screenings   Food Insecurity: No Food Insecurity (02/06/2024)  Housing: Low Risk  (02/06/2024)  Transportation Needs: No Transportation Needs (02/06/2024)  Utilities: Not At Risk (02/06/2024)  Social Connections: Moderately Integrated (02/06/2024)  Tobacco Use: Low Risk  (02/02/2024)    Readmission Risk Interventions    01/26/2024   12:46 PM 10/21/2023   11:36 AM  Readmission Risk Prevention Plan  Transportation Screening Complete Complete  PCP or Specialist Appt within 5-7 Days  Complete  Home Care Screening  Complete  Medication Review (RN CM)  Complete  Medication Review (RN Care Manager) Referral to Pharmacy   PCP or Specialist appointment within 3-5 days of discharge Complete   HRI or Home Care Consult Complete   SW Recovery Care/Counseling Consult Complete   Palliative Care Screening Not Applicable   Skilled Nursing Facility Not Applicable

## 2024-02-22 NOTE — Progress Notes (Cosign Needed)
 Advanced Heart Failure Rounding Note  Cardiologist: Alm Clay, MD  HF Consulting Cardiologist: Dr. Zenaida  Chief Complaint: Acute on chronic HFrEF Subjective:    9/9 PTCA LAD d/t stent thrombosis. A fib w/ RVR. CO-OX 44 %. Started on milrinone  0.25 mcg.  9/10:  EGD and flex sig 9/10.  EGD with 1 superficial esophageal ulcer, LA grade C reflux esophagitis with no bleeding. Flex sig: Red blood in the rectum, sigmoid colon, descending colon and transverse colon. Few diverticula found. No obvious source of bleeding.  9/18 Milrinone  stopped.  9/22: CT A/P. Dilated loops of small bowel and colon w/ air fluid levels in distal colon and rectum w/ obstruction or pneumatosis. 9/28: NGT removed, ileus resolved.  9/29: TEE/DCCV  Co-ox 61%. CVP <5  Sitting up in bed. Feeling well. No SOB, although reports dizziness with standing.   Objective:    Weight Range: 76.8 kg Body mass index is 22.96 kg/m.   Vital Signs:   Temp:  [97.5 F (36.4 C)-98.4 F (36.9 C)] 97.8 F (36.6 C) (09/30 1050) Pulse Rate:  [65-101] 69 (09/30 1050) Resp:  [14-24] 20 (09/30 1050) BP: (92-114)/(61-82) 110/69 (09/30 1050) SpO2:  [90 %-100 %] 93 % (09/30 1050) Weight:  [76.8 kg] 76.8 kg (09/30 0352) Last BM Date : 02/22/24  Weight change: Filed Weights   02/20/24 0300 02/21/24 0532 02/22/24 0352  Weight: 77.7 kg 77.9 kg 76.8 kg   Intake/Output:  Intake/Output Summary (Last 24 hours) at 02/22/2024 1214 Last data filed at 02/22/2024 0353 Gross per 24 hour  Intake 1626.44 ml  Output 1800 ml  Net -173.56 ml    Physical Exam   General: Elderly appearing. No distress on RA Cardiac: JVP flat. S1 and S2 present. No murmurs. Extremities: Warm and dry.  No peripheral edema.  Neuro: Alert and oriented x3. Affect pleasant.   Telemetry   SR 80-90s (personally reviewed)  Labs    CBC Recent Labs    02/21/24 0550 02/22/24 0553  WBC 9.0 8.7  HGB 11.4* 11.4*  HCT 35.5* 35.1*  MCV 88.8 87.3  PLT  568* 486*   Basic Metabolic Panel Recent Labs    90/70/74 0550 02/22/24 0553  NA 134* 134*  K 4.4 3.9  CL 107 102  CO2 17* 19*  GLUCOSE 113* 185*  BUN 34* 25*  CREATININE 1.55* 1.37*  CALCIUM  8.5* 8.3*  MG 2.2 2.0   Liver Function Tests Recent Labs    02/21/24 0550 02/22/24 0553  AST 15 18  ALT 14 15  ALKPHOS 71 66  BILITOT 0.7 0.6  PROT 5.9* 5.6*  ALBUMIN 2.8* 2.7*    Medications:   Scheduled Medications:  apixaban   5 mg Oral BID   Chlorhexidine  Gluconate Cloth  6 each Topical Q0600   [START ON 02/23/2024] clopidogrel   75 mg Oral Daily   digoxin   0.0625 mg Oral Daily   feeding supplement  1 Container Oral TID BM   Gerhardt's butt cream   Topical BID   magic mouthwash  5 mL Oral QID   melatonin  5 mg Oral QHS   multivitamin with minerals  1 tablet Oral Daily   nystatin    Topical BID   pantoprazole   40 mg Oral BID   potassium chloride   40 mEq Oral BID   potassium chloride   40 mEq Oral Q4H   rifaximin   550 mg Oral TID   rosuvastatin   20 mg Oral Daily   sodium chloride  flush  10-40 mL Intracatheter Q12H  sodium chloride  flush  3 mL Intravenous Q12H   spironolactone   25 mg Oral Daily   torsemide   40 mg Oral Daily    Infusions:  amiodarone  30 mg/hr (02/22/24 0057)     PRN Medications: acetaminophen , ALPRAZolam , atropine , diphenhydrAMINE , guaiFENesin -dextromethorphan , ipratropium-albuterol , metoCLOPramide  (REGLAN ) injection, mouth rinse, polyethylene glycol, simethicone , sodium chloride  flush, sodium chloride  flush  Patient Profile   Chase Combs is a 78 year old with a history of ICM, chronic biventricular HFrEF, CAD s/p PCI to LAD in 08/25.      Readmitted 01/22/24 with abdominal pain/nausea/vomiting. Hospital course complicated by ischemic colitis w/ septic shock/GI bleed, stent thrombosis involving recent LAD stent and low-output HF.   Assessment/Plan   CAD, recent STEMI d/t stent thrombosis - 01/17/24- PCI/DES LAD  - 02/01/24 Cangrelor  stopped given  Plavix  load and ST elevation. Acute stent thrombosis involving LAD stent treated with PTCA. - Plavix  Genetic Testing collected, pending (sent 9/10, checked with lab 9/22 and still in process) - Switched to Plavix , received load - Off aspirin  with GI bleed.  - Continue high-intensity statin.   New A fib RVR - Given his low output HF, would avoid giving additional IV beta blocker. - Continue Eliquis  5 bid. Hgb stable. - Continue digoxin  0.0625 - s/p successful DCCV yesterday - remains in NSR - switch IV amio to PO 200 mg bid   A/C HFrEF, ICM   - 01/13/24 Echo EF  20-25% RV moderately reduced.   - 02/01/24 Echo LVEF 20-25% RV moderately reduced.    - Started on milrinone , now off.  - Co-ox stable off milrinone . CVP - TEE yesterday with EF 20-25, mod HK RV - Off SGLT2i with fungal rash - Continue spiro 25 mg daily - Continue digoxin  0.0625 mcg daily. Dig level < 0.6. - No BP room to further titrate GDMT - Renal function stable.    Anemia in the setting of GI Bleed -Transfused 9/8 with appropriate rise.  - 9/10 he had large bloody BM. Unfortunately given 600 mg Ibuprofen .  - Protonix  and Pepcid  per GI - GI following.  - S/p EGD and flex sig.  EGD with 1 superficial esophageal ulcer, LA grade C reflux esophagitis with no bleeding. Flex sig: Red blood in the rectum, sigmoid colon, descending colon and transverse colon. Few diverticula found. No obvious source of bleeding, possibly diverticular per GI. - Hgb stable.   Intestinal pneumatosis w/ concern for ischemic colitis Ileus Psuedoobstruction - Gen surgery recommended conservative management - Treated with antibiotics for suspected septic shock. - Transverse colon measures 9.3 cm on Xray last week.  - CT A/P w/o on 09/22 w/ dilated loops of small bowel and colon w/ air fluid levels in distal colon and rectum w/ obstruction or pneumatosis - Getting neostigmine  but poor response. Abdomen much more distended today F/u abdominal X-ray  pending.  - Improved with decompression. Appreciate GI.   Hypokalemia - resolved  Orthostatic Hypotension - positive orthostatic vitals this morning   Code status: DNR   Length of Stay: 40  [place] Lee, NP  02/22/2024, 12:14 PM  Advanced Heart Failure Team Pager [phone] (M-F; 7a - 5p)  Please contact [facility] for night-coverage after hours (5p -7a ) and weekends on [email or web address]  Patient seen and examined with the above-signed Advanced Practice Provider and/or Housestaff. I personally reviewed laboratory data, imaging studies and relevant notes. I independently examined the patient and formulated the important aspects of the plan. I have edited the note to reflect any of my changes  or salient points. I have personally discussed the plan with the patient and/or family.  Remains in NSR after DC-CV yesterday. Still with PVCs.   Co-ox 61% CVP low  Denies CP or SOB. Belly ok  Orthostatic today  General:  Sitting up in chair No resp difficulty HEENT: normal Neck: supple. no JVD. Carotids 2+ bilat; no bruits. No lymphadenopathy or thryomegaly appreciated. Cor: PMI nondisplaced. Regular rate & rhythm. No rubs, gallops or murmurs. Lungs: clear Abdomen: soft, nontender, nondistended. No hepatosplenomegaly. No bruits or masses. Good bowel sounds. Extremities: no cyanosis, clubbing, rash, tr edema Neuro: alert & orientedx3, cranial nerves grossly intact. moves all 4 extremities w/o difficulty. Affect pleasant  Remains in NSR. Co-ox stable off milrinone    Switch IV amio to po. Hold diuretics.  Await CIR evaluate.   Toribio Fuel, MD  1:00 AM

## 2024-02-22 NOTE — Consult Note (Signed)
 Physical Medicine and Rehabilitation Consult Reason for Consult:debility  Referring Physician: Laurence   HPI: Chase Combs is a 78 y.o. male with a history of ischemic cardiomyopathy and CAD who presented on 01/22/2024 with progressive nausea and vomiting.  CT imaging was concerning for ischemic colitis, ileus.  The patient developed septic shock.  He required blood transfusion for anemia.  02/01/2024 patient had recurrent ST EMI with in-stent stenosis.  Patient developed lower GI bleeding while on Brilinta  aspirin  and Eliquis .  Developed a small and large bowel ileus with pseudoobstruction and underwent colonoscopic decompression on 9/26.  Patient was up with physical therapy today and walked 56 feet contact-guard assist, and he was sit to stand contact-guard to min assist.  For basic ADLs patient was min to max assist.  Patient lives at home with his spouse in a 1 level house with ramped entry.  He was driving and independent prior to this admission.    Home: Home Living Family/patient expects to be discharged to:: Private residence Living Arrangements: Spouse/significant other Available Help at Discharge: Family, Available 24 hours/day Type of Home: House Home Access: Ramped entrance Home Layout: One level Bathroom Shower/Tub: Engineer, manufacturing systems: Handicapped height Home Equipment: Agricultural consultant (2 wheels), Rollator (4 wheels), [employer] - single point, Lift chair, Grab bars - toilet, Tub bench, Wheelchair - manual, Art gallery manager Additional Comments: States wife with PD and recent TKA but able to care for herself. Hx of equipment a bit off during my evaluation compared to recent admission recordings. Would double check.  Functional History: Prior Function Prior Level of Function : Driving, Independent/Modified Independent Mobility Comments: Rollator use ADLs Comments: reports mod I PTA Functional Status:  Mobility: Bed Mobility Overal bed mobility: Needs  Assistance Bed Mobility: Supine to Sit, Rolling Rolling: Modified independent (Device/Increase time), Used rails Supine to sit: HOB elevated, Used rails, Contact guard Sit to supine: Supervision General bed mobility comments: able to perform rolling for peri care following bed pan use, able to get to EOB with CGA Transfers Overall transfer level: Needs assistance Equipment used: Rolling walker (2 wheels) Transfers: Sit to/from Stand, Bed to chair/wheelchair/BSC Sit to Stand: Contact guard assist, Min assist Bed to/from chair/wheelchair/BSC transfer type:: Step pivot Step pivot transfers: Contact guard assist General transfer comment: cues for hand placement and safety, patient required assistance to lower to chair Ambulation/Gait Ambulation/Gait assistance: Contact guard assist, Min assist, +2 safety/equipment Gait Distance (Feet): 56 Feet (15ft, seated break, 58ft, seated break, 61ft) Assistive device: Rolling walker (2 wheels) Gait Pattern/deviations: Step-through pattern, Decreased stride length, Trunk flexed, Staggering right (Bil genu varum) General Gait Details: Close chair follow for safety with pt c/o dizziness at 48ft, pt took seated break for BP assessment, then upon standing pt BP taken and pt noted to be orthostatic, but mild symptoms of dizziness and pt requesting to walk with chair follow again. Mild lateral LOB with pt fatigue needing minA and RW support to correct x2 occasions. Pt sits very abruptly after third short gait trial. Gait velocity: dec Gait velocity interpretation: <1.31 ft/sec, indicative of household ambulator Stairs:  (defer, symptomatic orthostatic hypotension)    ADL: ADL Overall ADL's : Needs assistance/impaired Eating/Feeding: Independent, Sitting Grooming: Set up, Sitting Upper Body Bathing: Set up, Sitting Lower Body Bathing: Sit to/from stand, Sitting/lateral leans, Maximal assistance Lower Body Bathing Details (indicate cue type and reason):  needs assist with socks/shoes Upper Body Dressing : Minimal assistance, Sitting Upper Body Dressing Details (indicate cue type and  reason): due to lines Lower Body Dressing: Maximal assistance Lower Body Dressing Details (indicate cue type and reason): compression stockings Toilet Transfer: Contact guard assist Toileting- Clothing Manipulation and Hygiene: Maximal assistance, Sit to/from stand Toileting - Clothing Manipulation Details (indicate cue type and reason): tech assisting patient with bed pan upon entry Functional mobility during ADLs: Rolling walker (2 wheels)  Cognition: Cognition Orientation Level: Oriented X4 Cognition Arousal: Alert Behavior During Therapy: WFL for tasks assessed/performed, Flat affect   Review of Systems  Constitutional:  Positive for malaise/fatigue.  HENT: Negative.    Eyes: Negative.   Respiratory:  Positive for cough and shortness of breath.   Gastrointestinal:  Positive for diarrhea and nausea.  Genitourinary:  Negative for dysuria.  Musculoskeletal:  Positive for myalgias.  Skin: Negative.   Neurological:  Positive for weakness.   Past Medical History:  Diagnosis Date   Abdominal hernia    per pt   Arthritis    hands   Benign localized prostatic hyperplasia with lower urinary tract symptoms (LUTS)    CAD (coronary artery disease) 05/2004   cardiologist--- dr anner;  positive stress test , had outpt cardiac cath 05-27-2004 stenosis RI;  06-18-2004 cath w/ PCI and BMS x1 to pRI;   STEMI 02-16-2009  s/p cath w/ PCI thrombectomy for total occluded RCA , BMS x1 to pRCA;  nuclear stress test 03-15-2012 low risk no ischemia but sig inferior-inferolateral infarct , ef 51%   Dyslipidemia    Essential hypertension    Heart murmur    History of COVID-19 05/2020   per pt mild symptoms that resolved   History of ST elevation myocardial infarction (STEMI) 02/16/2009   inferior STEMI   cath w/ pci w/ BMS to pRCA   History of urinary retention     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[facility]) 04/2021   urologist--- dr borden/ radiation oncology-- dr patrcia;  dx 12/ 2022,  Gleason 4+5, PSA 24.6, volume 94.6cc;  plan to start IMRT   OSA (obstructive sleep apnea)    per pt dx approx 2008, no cpap intolerant   PONV (postoperative nausea and vomiting)    Pre-diabetes    S/p bare metal coronary artery stent    01/ 2006  x1 BMS to pRI (CoStar study stent);  and 09/ 2010  x1 BMS to pRCA   Status post total knee replacement, 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   Past Surgical History:  Procedure Laterality Date   BOWEL DECOMPRESSION N/A 02/18/2024   Procedure: DECOMPRESSION, INTESTINE;  Surgeon: Albertus Gordy HERO, MD;  Location: [facility] ENDOSCOPY;  Service: Gastroenterology;  Laterality: N/A;   CARDIAC CATHETERIZATION  05/27/2004   outpatient by dr burnard [facility] cardiology) for positive stress test;  80% stensis pRI   CARDIOVERSION N/A 02/21/2024   Procedure: CARDIOVERSION;  Surgeon: Cherrie Toribio SAUNDERS, MD;  Location: [facility] INVASIVE CV LAB;  Service: Cardiovascular;  Laterality: N/A;   COLONOSCOPY N/A 02/18/2024   Procedure: COLONOSCOPY;  Surgeon: Albertus Gordy HERO, MD;  Location: [facility] ENDOSCOPY;  Service: Gastroenterology;  Laterality: N/A;   CORONARY ANGIOPLASTY WITH STENT PLACEMENT  06/18/2004   @[facility]  by Dr Lavon;  PCI w/ BMS to pRI  (CoStar study stent - 2.5 x 16)   CORONARY ANGIOPLASTY WITH STENT PLACEMENT  02/16/2009   @[facility]  by dr verlin;   Inferior STEMI:  PCI w/ thrombectomy total occluded RCA, BMS x1 to pRCA (Vision BMS 3.5 x 23 -> 4.0 mm), residual 40-50% pLAD, ef 45-50%   CORONARY PRESSURE/FFR STUDY N/A 01/17/2024   Procedure: CORONARY  PRESSURE/FFR STUDY;  Surgeon: Mady Bruckner, MD;  Location: [facility] INVASIVE CV LAB;  Service: Cardiovascular;  Laterality: N/A;   CORONARY STENT INTERVENTION N/A 01/17/2024   Procedure: CORONARY STENT INTERVENTION;  Surgeon: Mady Bruckner, MD;  Location: [facility] INVASIVE CV LAB;  Service: Cardiovascular;  Laterality: N/A;   CORONARY  ULTRASOUND/IVUS N/A 01/17/2024   Procedure: Coronary Ultrasound/IVUS;  Surgeon: Mady Bruckner, MD;  Location: [facility] INVASIVE CV LAB;  Service: Cardiovascular;  Laterality: N/A;   CORONARY/GRAFT ACUTE MI REVASCULARIZATION N/A 02/01/2024   Procedure: Coronary/Graft Acute MI Revascularization;  Surgeon: Elmira Newman PARAS, MD;  Location: [facility] INVASIVE CV LAB;  Service: Cardiovascular;  Laterality: N/A;   ESOPHAGOGASTRODUODENOSCOPY N/A 02/02/2024   Procedure: EGD (ESOPHAGOGASTRODUODENOSCOPY);  Surgeon: Nandigam, Kavitha V, MD;  Location: [facility] ENDOSCOPY;  Service: Gastroenterology;  Laterality: N/A;   FLEXIBLE SIGMOIDOSCOPY N/A 10/20/2023   Procedure: KINGSTON SIDE;  Surgeon: Legrand Victory LITTIE DOUGLAS, MD;  Location: [facility] ENDOSCOPY;  Service: Gastroenterology;  Laterality: N/A;   FLEXIBLE SIGMOIDOSCOPY N/A 02/02/2024   Procedure: SIGMOIDOSCOPY, FLEXIBLE;  Surgeon: Shila Gustav GAILS, MD;  Location: [facility] ENDOSCOPY;  Service: Gastroenterology;  Laterality: N/A;   GOLD SEED IMPLANT N/A 09/19/2021   Procedure: GOLD SEED IMPLANT;  Surgeon: Cam Morene ORN, MD;  Location: [facility];  Service: Urology;  Laterality: N/A;  ONLY NEEDS 30 MIN FOR ALL   INGUINAL HERNIA REPAIR Right 09/02/1999   @[facility] ;   recurrent repair right inguinal hernia;   previous repair approx 1990s   INGUINAL HERNIA REPAIR Left    1990s   KNEE ARTHROSCOPY W/ MENISCAL REPAIR Right 12/18/2005   @[facility]    LUMBAR DISC SURGERY  1982   L5--S1   RIGHT/LEFT HEART CATH AND CORONARY ANGIOGRAPHY N/A 01/17/2024   Procedure: RIGHT/LEFT HEART CATH AND CORONARY ANGIOGRAPHY;  Surgeon: Mady Bruckner, MD;  Location: [facility] INVASIVE CV LAB;  Service: Cardiovascular;  Laterality: N/A;   SPACE OAR INSTILLATION N/A 09/19/2021   Procedure: SPACE OAR INSTILLATION;  Surgeon: Cam Morene ORN, MD;  Location: [facility];  Service: Urology;  Laterality: N/A;   TOTAL KNEE ARTHROPLASTY Left 10/01/2023   Procedure: ARTHROPLASTY, KNEE, TOTAL;   Surgeon: Kay Kemps, MD;  Location: [facility] ORS;  Service: Orthopedics;  Laterality: Left;   TRANSESOPHAGEAL ECHOCARDIOGRAM (CATH LAB) N/A 02/21/2024   Procedure: TRANSESOPHAGEAL ECHOCARDIOGRAM;  Surgeon: Cherrie Toribio SAUNDERS, MD;  Location: [facility] INVASIVE CV LAB;  Service: Cardiovascular;  Laterality: N/A;   Family History  Problem Relation Age of Onset   Lung cancer Mother    Colon cancer Neg Hx    Esophageal cancer Neg Hx    Social History:  reports that he has never smoked. He has never used smokeless tobacco. He reports that he does not drink alcohol and does not use drugs. Allergies:  Allergies  Allergen Reactions   Codeine Nausea And Vomiting   Latex Rash    Oral blisters when used at dentist   Medications Prior to Admission  Medication Sig Dispense Refill   amiodarone  (PACERONE ) 200 MG tablet Take 1 tablet (200 mg total) by mouth daily. 30 tablet 0   aspirin  EC 81 MG tablet Take 81 mg by mouth 2 (two) times daily.     CALCIUM  PO Take 1 tablet by mouth daily.     clotrimazole-betamethasone (LOTRISONE) cream Apply 1 Application topically 2 (two) times daily as needed (skin irritation).     Cyanocobalamin  (VITAMIN B-12 PO) Take 1 tablet by mouth daily.     furosemide  (LASIX ) 20 MG tablet Take 1 tablet (  20 mg total) by mouth daily. 90 tablet 3   metoprolol  succinate (TOPROL -XL) 25 MG 24 hr tablet Take 1 tablet (25 mg total) by mouth daily. 30 tablet 0   Multiple Vitamins-Minerals (MULTIVITAMIN MEN 50+) TABS Take 1 tablet by mouth daily.     Probiotic Product (PROBIOTIC PO) Take 1 capsule by mouth daily.     rosuvastatin  (CRESTOR ) 20 MG tablet TAKE 1 TABLET(20 MG) BY MOUTH DAILY 30 tablet 11   sacubitril -valsartan  (ENTRESTO ) 49-51 MG Take 1 tablet by mouth 2 (two) times daily. 60 tablet 1   spironolactone  (ALDACTONE ) 25 MG tablet Take 0.5 tablets (12.5 mg total) by mouth daily. 30 tablet 1   tamsulosin  (FLOMAX ) 0.4 MG CAPS capsule Take 1 capsule (0.4 mg total) by mouth daily after  supper. 30 capsule 5   ticagrelor  (BRILINTA ) 90 MG TABS tablet Take 1 tablet (90 mg total) by mouth 2 (two) times daily. 60 tablet 1   nitroGLYCERIN  (NITROSTAT ) 0.4 MG SL tablet Place 1 tablet (0.4 mg total) under the tongue every 5 (five) minutes as needed for chest pain. (Patient not taking: Reported on 01/23/2024) 25 tablet 3   ondansetron  (ZOFRAN -ODT) 4 MG disintegrating tablet Take 1 tablet (4 mg total) by mouth every 8 (eight) hours as needed for nausea or vomiting. (Patient not taking: Reported on 01/23/2024) 20 tablet 0     Blood pressure 110/69, pulse 69, temperature 97.8 F (36.6 C), temperature source Oral, resp. rate 20, height 6' (1.829 m), weight 76.8 kg, SpO2 93%. Physical Exam Constitutional:      General: He is not in acute distress.    Appearance: He is ill-appearing.  HENT:     Head: Normocephalic.     Right Ear: External ear normal.     Left Ear: External ear normal.     Nose: Nose normal.     Mouth/Throat:     Mouth: Mucous membranes are moist.  Eyes:     Conjunctiva/sclera: Conjunctivae normal.  Cardiovascular:     Rate and Rhythm: Normal rate.  Pulmonary:     Effort: Pulmonary effort is normal.  Abdominal:     Palpations: Abdomen is soft.     Tenderness: There is no abdominal tenderness.  Genitourinary:    Comments: Condom cath, rectal tube Musculoskeletal:     Cervical back: Normal range of motion.  Skin:    General: Skin is warm.     Findings: Bruising present.  Neurological:     Mental Status: He is alert.     Comments: Alert and oriented x 3. Normal insight and awareness. Intact Memory. Normal language and speech. Cranial nerve exam unremarkable. MMT: BUE 4- deltoids, biceps and triceps, 4/5 hands and wrist. BLE: 3/5 HF, KE and 4/5 ADF/PF. Sensory exam normal for light touch and pain in all 4 limbs. No limb ataxia or cerebellar signs. No abnormal tone appreciated.  [name]    Psychiatric:     Comments: Pt flat but generally pleasant and cooperative      Results for orders placed or performed during the hospital encounter of 01/22/24 (from the past 24 hours)  Comprehensive metabolic panel     Status: Abnormal   Collection Time: 02/22/24  5:53 AM  Result Value Ref Range   Sodium 134 (L) 135 - 145 mmol/L   Potassium 3.9 3.5 - 5.1 mmol/L   Chloride 102 98 - 111 mmol/L   CO2 19 (L) 22 - 32 mmol/L   Glucose, Bld 185 (H) 70 - 99 mg/dL  BUN 25 (H) 8 - 23 mg/dL   Creatinine, Ser 8.62 (H) 0.61 - 1.24 mg/dL   Calcium  8.3 (L) 8.9 - 10.3 mg/dL   Total Protein 5.6 (L) 6.5 - 8.1 g/dL   Albumin 2.7 (L) 3.5 - 5.0 g/dL   AST 18 15 - 41 U/L   ALT 15 0 - 44 U/L   Alkaline Phosphatase 66 38 - 126 U/L   Total Bilirubin 0.6 0.0 - 1.2 mg/dL   GFR, Estimated 53 (L) >60 mL/min   Anion gap 13 5 - 15  Cooxemetry Panel (carboxy, met, total hgb, O2 sat)     Status: Abnormal   Collection Time: 02/22/24  5:53 AM  Result Value Ref Range   Total hemoglobin 11.7 (L) 12.0 - 16.0 g/dL   O2 Saturation 39.3 %   Carboxyhemoglobin 1.2 0.5 - 1.5 %   Methemoglobin <0.7 0.0 - 1.5 %  Magnesium      Status: None   Collection Time: 02/22/24  5:53 AM  Result Value Ref Range   Magnesium  2.0 1.7 - 2.4 mg/dL  CBC     Status: Abnormal   Collection Time: 02/22/24  5:53 AM  Result Value Ref Range   WBC 8.7 4.0 - 10.5 K/uL   RBC 4.02 (L) 4.22 - 5.81 MIL/uL   Hemoglobin 11.4 (L) 13.0 - 17.0 g/dL   HCT 64.8 (L) 60.9 - 47.9 %   MCV 87.3 80.0 - 100.0 fL   MCH 28.4 26.0 - 34.0 pg   MCHC 32.5 30.0 - 36.0 g/dL   RDW 80.6 (H) 88.4 - 84.4 %   Platelets 486 (H) 150 - 400 K/uL   nRBC 0.0 0.0 - 0.2 %   EP STUDY Result Date: 02/21/2024 See surgical note for result.  Assessment/Plan: Diagnosis: 78 year old male with debility after sepsis and ischemic colitis and multiple associated complications Does the need for close, 24 hr/day medical supervision in concert with the patient's rehab needs make it unreasonable for this patient to be served in a less intensive setting?  Yes Co-Morbidities requiring supervision/potential complications:  - GI bleeding -CAD -Ileus -Nutrition and wound care -persistent diarrhea Due to bladder management, bowel management, safety, skin/wound care, disease management, medication administration, pain management, and patient education, does the patient require 24 hr/day rehab nursing? Yes Does the patient require coordinated care of a physician, rehab nurse, therapy disciplines of PT, OT to address physical and functional deficits in the context of the above medical diagnosis(es)? Yes Addressing deficits in the following areas: balance, endurance, locomotion, strength, transferring, bowel/bladder control, bathing, dressing, feeding, grooming, toileting, and psychosocial support Can the patient actively participate in an intensive therapy program of at least 3 hrs of therapy per day at least 5 days per week? Yes The potential for patient to make measurable gains while on inpatient rehab is excellent Anticipated functional outcomes upon discharge from inpatient rehab are modified independent  with PT, modified independent and supervision with OT, n/a with SLP. Estimated rehab length of stay to reach the above functional goals is: 7-10 days Anticipated discharge destination: Home Overall Rehab/Functional Prognosis: excellent  POST ACUTE RECOMMENDATIONS: This patient's condition is appropriate for continued rehabilitative care in the following setting: CIR Patient has agreed to participate in recommended program. Yes Note that insurance prior authorization may be required for reimbursement for recommended care.  Comment: Pt with poor stamina and balance. Has ongoing GI/nutritional issues as well. Rehab Admissions Coordinator to follow up.       I have personally performed  a face to face diagnostic evaluation of this patient. Additionally, I have examined the patient's medical record including any pertinent labs and radiographic  images.    Thanks,  Arthea ONEIDA Gunther, MD 02/22/2024

## 2024-02-22 NOTE — Progress Notes (Signed)
 Occupational Therapy Treatment Patient Details Name: Chase Combs MRN: [id number] DOB: Jan 15, 1946 Today's Date: 02/22/2024   History of present illness 78 year old man admitted 8/30 with progressive nausea vomiting with CT images concerning for ischemic colitis; pneumatosis intestinalis/ileus. 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 Hospitalization complicated by probable right-sided ischemic colitis and transient lower GI bleeding in setting of Brilinta /aspirin /Eliquis .     Patient subsequently developed a small and large bowel ileus/pseudo obstruction which has been refractory.  Patient s/p colonoscopic decompression 9/26 with significant improvement in his abdominal distention and discomfort. PMH: ischemic cardiomyopathy and very recent LAD stent placement for NSTEMI discharged 8/27.   OT comments  Patient received in supine with nursing assisting with cleaning following bed pan use. Patient agreeable to therapy session and was able to get to EOB with CGA.  Patient performed mobility and transfer training with +2 assist for safety and line management.  HR in 70's during mobility with standing BP 79/59 and 105/78 at end of session in recliner. Patient will benefit from intensive inpatient follow-up therapy, >3 hours/day.  Acute OT to continue to follow to address established goals to facilitate DC to next venue of care.        If plan is discharge home, recommend the following:  A little help with walking and/or transfers;A little help with bathing/dressing/bathroom;Assistance with cooking/housework;Assist for transportation;Help with stairs or ramp for entrance   Equipment Recommendations  Other (comment) (TBD at next venue but not reporting DME needs if to go home)    Recommendations for Other Services      Precautions / Restrictions Precautions Precautions: Fall Recall of Precautions/Restrictions: Intact Precaution/Restrictions Comments: history of orthostatic hypotension; not on  O2 baseline; colonic decompression tube and LUE triple lumen catheter Restrictions Weight Bearing Restrictions Per Provider Order: No       Mobility Bed Mobility Overal bed mobility: Needs Assistance Bed Mobility: Supine to Sit, Rolling Rolling: Modified independent (Device/Increase time), Used rails   Supine to sit: HOB elevated, Used rails, Contact guard     General bed mobility comments: able to perform rolling for peri care following bed pan use, able to get to EOB with CGA    Transfers Overall transfer level: Needs assistance Equipment used: Rolling walker (2 wheels) Transfers: Sit to/from Stand, Bed to chair/wheelchair/BSC Sit to Stand: Contact guard assist, Min assist           General transfer comment: cues for hand placement and safety, patient required assistance to lower to chair     Balance Overall balance assessment: Needs assistance Sitting-balance support: Feet supported Sitting balance-Leahy Scale: Good     Standing balance support: Bilateral upper extremity supported, Single extremity supported, No upper extremity supported Standing balance-Leahy Scale: Poor Standing balance comment: Reliant on RW, lateral LOB with turning x2                           ADL either performed or assessed with clinical judgement   ADL Overall ADL's : Needs assistance/impaired     Grooming: Set up;Sitting           Upper Body Dressing : Minimal assistance;Sitting Upper Body Dressing Details (indicate cue type and reason): due to lines Lower Body Dressing: Maximal assistance Lower Body Dressing Details (indicate cue type and reason): compression stockings       Toileting - Clothing Manipulation Details (indicate cue type and reason): tech assisting patient with bed pan upon entry  Extremity/Trunk Assessment              Occupational psychologist Communication: No apparent  difficulties Factors Affecting Communication: Hearing impaired   Cognition Arousal: Alert Behavior During Therapy: WFL for tasks assessed/performed, Flat affect Cognition: No apparent impairments                               Following commands: Intact        Cueing   Cueing Techniques: Verbal cues, Gestural cues  Exercises      Shoulder Instructions       General Comments BP seated 91/69, standing 79/59, up in recliner with feet elevated at end of session 105/78    Pertinent Vitals/ Pain       Pain Assessment Pain Assessment: Faces Faces Pain Scale: Hurts a little bit Pain Location: generalized Pain Descriptors / Indicators: Grimacing, Guarding Pain Intervention(s): Monitored during session, Repositioned  Home Living                                          Prior Functioning/Environment              Frequency  Min 2X/week        Progress Toward Goals  OT Goals(current goals can now be found in the care plan section)  Progress towards OT goals: Progressing toward goals  Acute Rehab OT Goals OT Goal Formulation: With patient Time For Goal Achievement: 03/01/24 Potential to Achieve Goals: Good ADL Goals Pt Will Perform Grooming: with modified independence;sitting Pt Will Perform Lower Body Bathing: with modified independence;with adaptive equipment;sit to/from stand Pt Will Perform Lower Body Dressing: with modified independence;with adaptive equipment;sit to/from stand;sitting/lateral leans Pt Will Transfer to Toilet: with modified independence;ambulating Pt Will Perform Toileting - Clothing Manipulation and hygiene: with supervision;sit to/from stand  Plan      Co-evaluation    PT/OT/SLP Co-Evaluation/Treatment: Yes Reason for Co-Treatment: Complexity of the patient's impairments (multi-system involvement);For patient/therapist safety;To address functional/ADL transfers PT goals addressed during session:  Mobility/safety with mobility;Balance;Proper use of DME OT goals addressed during session: ADL's and self-care      AM-PAC OT 6 Clicks Daily Activity     Outcome Measure   Help from another person eating meals?: None Help from another person taking care of personal grooming?: None Help from another person toileting, which includes using toliet, bedpan, or urinal?: A Little Help from another person bathing (including washing, rinsing, drying)?: A Little Help from another person to put on and taking off regular upper body clothing?: None Help from another person to put on and taking off regular lower body clothing?: A Little 6 Click Score: 21    End of Session Equipment Utilized During Treatment: Gait belt;Rolling walker (2 wheels)  OT Visit Diagnosis: Unsteadiness on feet (R26.81);Muscle weakness (generalized) (M62.81)   Activity Tolerance Patient tolerated treatment well   Patient Left in chair;with call bell/phone within reach;with chair alarm set   Nurse Communication Mobility status        Time: 9071-9043 OT Time Calculation (min): 28 min  Charges: OT General Charges $OT Visit: 1 Visit OT Treatments $Self Care/Home Management : 8-22 mins  Dick Laine, OTA Acute Rehabilitation Services  Office [phone]   Jeb LITTIE Laine 02/22/2024,  11:02 AM

## 2024-02-22 NOTE — Progress Notes (Signed)
 PROGRESS NOTE    Chase Combs  FMW:[id number] DOB: [birth year] DOA: 01/22/2024 PCP: Wonda Worth SQUIBB, PA  Subjective: Pt seen and examined. Had DCCV yesterday. Still in NSR today. Still on IV amio today. Awaiting PT to see him. CIR still looking at his chart. Pt having liquid stools and passing flatus. GI has advanced his diet to regular food.   Hospital Course: CC: weakness, emesis HPI: 78 year old man w/ hx of ischemic cardiomyopathy and very recent LAD stent placement for NSTEMI discharged 8/27 (no d/c summary so unclear) with progressive nausea vomiting with CT images concerning for ischemic colitis.   Feeling weak and unwell since discharge 3 days ago.  Progressive nausea and vomiting not keeping down his antiplatelet medicines.  CT images reveal intestinal air in the wall of the intestine as well as concerning findings of ileus.  His lactate was elevated.  He was volume resuscitated.  Remained hypotensive.  Placed on broad-spectrum antibiotics he received vancomycin  Zosyn  doxycycline and Flagyl .  Labs notable for acute renal failure.  Anion gap metabolic acidosis.  Leukopenia, lymphopenia.  Significant Events: Admitted 01/22/2024 to ICU by PCCM due to intestinal pneumatosis concerning for ischemic colitis 01-24-2024 to 01-27-2024 eneral surgery recommended conservative management.   Concern patient not able to absorb antiplatelet therapy, he was placed on IV cangrelor .  Patient continue to be hypotensive despite IV fluids and was placed on norepinephrine  infusion, consistent with septic shock due to abdominal source/ ischemic colitis.  during hospitalization required PRBC transfusion due to blood loss anemia.   There was concerns of in-stent thrombosis from recent PCI therefore antiplatelets were switched from Cangrelor  to Plavix  and started on milrinone  drip due to cardiogenic shock.   Developed atrial fibrillation with rapid ventricular response, placed on IV amiodarone .  Transition to  Aggrastat for anticoagulation.  09/05 GI consulted, clinical picture consistent with colonic ileus, Ogilvie syndrome.  09/08 PRBC transfusion x1, transitioned to clopidogrel .  09/09 chest pain with new antero lateral ST elevations. Underwent urgent cardiac catheterization, LAD with ostially occluded stent with acute stent thrombosis (Culprit vessel). Successful percutaneous coronary intervention ostial proximal LAD, PTCA with balloon dilatation.  Significant volume overloaded with low SV02 and CVP very high, placed on IV furosemide  and milrinone .  09/10 completed antibiotic course.  09/10 EGD with esophageal ulcer with no bleeding and no stigmata of recent bleeding.  LA grade C reflux esophagitis with no bleeding.  Duodenal erosions without bleeding.  Flexible sigmoidoscopy positive blood in the rectum, in the sigmoid colon, in the descending colon and in the transverse colon. No obvious source of bleed.  Diverticulosis in the sigmoid colon and in the descending colon. No active bleeding.  A single superficial ulcer in the rectum. No active bleeding.   09/12 transition to ticagrelor .  Slowly weaned off TPN, diet as tolerated.   09/19 continue colonic pseudo obstruction, imaging with progression of colonic distention. Started on rifaximin .  Pending cardioversion until bowel function improves.  09/21 pending direct current cardioversion.  09/22 CT abdomen and pelvis dilated loops of small bowel and colon with air fluid levels in the distal colon and rectum. No obstruction. Appearance consistent with ileus or functional bowel disturbance.  09/23 trial of neostigmine   02-18-2024 persistent colonic pseudo obstruction despite several days of SQ neostigmine  and 1 dose of IV neostigmine . All without improvement in his symptoms. Abd XR continue to show dilated colon.  Pt taken for decompression colonoscopy with colonic decompression tube placed 02-20-2024 significant improvement in abd distension. Pt  now having  multiple liquid BM.  Pt remains on IV amiodarone  and eliquis  for rapid afib. 02-21-2024 pt successfully cardioverted back to NSR. Pt loaded with 300 mg plavix  after his plavix  metabolism test showed he is a high metabolizer of plavix .  Admission Labs: Na 132, K 4.5, CO2 of 31, BUN 64, scr 2.55, glu 197 T prot 6.2, alb 3.0, AST 29, ALT 31, alk pho 64, t. Bili 1.4 WBC 2.4, HgB 13.9, plt 489 BNP 220 Procalcitonin 1.23  Admission Imaging Studies: CXR Low lung volumes with bibasilar linear and patchy opacities. 2. Small stable bilateral pleural effusion. 3. Elevated right hemidiaphragm. CT chest/abd/pelvis Significantly improved multifocal, bilateral pneumonia since 01/14/24. Persistent airspace and ground glass opacities in the right lower lobe, left lower lobe, and posterior left upper lobe/lingula, likely residual pneumonia or aspiration. 2. Gas along the wall of the ascending colon and cecum, suspicious for pneumatosis. Findings called to PA [place] at 3:19 pm. 3. Fluid-filled colon suggests a diarrheal state. Diffuse colonic and small bowel distention likely represent Ileus. 4. Increased small volume pelvic and new right-sided pericolonic trace fluid, likely secondary to the colonic proces. 5. Incidental findings, including: Pulmonary artery enlargement suggests pulmonary arterial hypertension. Aortic and coronary artery atherosclerosis. Right nephrolithiasis. Suspicion of minimal chronic calcific pancreatitis.  Significant Labs:   Significant Imaging Studies: 01-30-2024 CTA GI bleed Severe narrowing is noted at origin of celiac artery with poststenotic dilatation. No definite evidence of contrast extravasation to suggest gastrointestinal bleeding. Mild wall and fold thickening of ascending colon is noted suggesting possible infectious or inflammatory colitis 02-02-2024 echo LVEF 20-25%. eft ventricle has severely decreased function  02-14-2024 CT abd/pelvis Dilated loops of small bowel and  colon with air-fluid levels in the distal colon and rectum indicating diarrheal process. No lead point for obstruction is identified. No pneumatosis or free air. Presumably the appearance is due to ileus or functional bowel disturbance. 2. 0.3 cm right kidney lower pole nonobstructive stone. 3. Multilevel lumbar degenerative facet arthropathy with multilevel foraminal impingement. 4. Umbilical hernia contains adipose tissue. 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 5. High density in the gallbladder probably from sludge. 6. Mild bibasilar atelectasis. 7. Aortic Atherosclerosis (ICD10-I70.0). Coronary atherosclerosis. Prominent cardiomegaly.  Antibiotic Therapy: Anti-infectives (From admission, onward)    Start     Dose/Rate Route Frequency Ordered Stop   02/11/24 1000  rifaximin  (XIFAXAN ) tablet 550 mg        550 mg Oral 3 times daily 02/11/24 0908     02/01/24 1645  cefTRIAXone  (ROCEPHIN ) 2 g in sodium chloride  0.9 % 100 mL IVPB  Status:  Discontinued        2 g 200 mL/hr over 30 Minutes Intravenous Every 24 hours 02/01/24 1553 02/03/24 1029   01/29/24 2200  metroNIDAZOLE  (FLAGYL ) tablet 500 mg  Status:  Discontinued        500 mg Oral Every 12 hours 01/29/24 1627 02/03/24 1029   01/22/24 2200  piperacillin -tazobactam (ZOSYN ) IVPB 3.375 g        3.375 g 12.5 mL/hr over 240 Minutes Intravenous Every 8 hours 01/22/24 1602 01/28/24 2359   01/22/24 1800  metroNIDAZOLE  (FLAGYL ) IVPB 500 mg  Status:  Discontinued        500 mg 100 mL/hr over 60 Minutes Intravenous Every 8 hours 01/22/24 1653 01/23/24 0857   01/22/24 1545  metroNIDAZOLE  (FLAGYL ) IVPB 500 mg  Status:  Discontinued        500 mg 100 mL/hr over 60 Minutes Intravenous  Once  01/22/24 1533 01/22/24 1634   01/22/24 1330  doxycycline (VIBRAMYCIN) 100 mg in sodium chloride  0.9 % 250 mL IVPB  Status:  Discontinued        100 mg 125 mL/hr over 120 Minutes Intravenous  Once 01/22/24 1320 01/22/24 1649   01/22/24 1315   azithromycin (ZITHROMAX) 500 mg in sodium chloride  0.9 % 250 mL IVPB  Status:  Discontinued        500 mg 250 mL/hr over 60 Minutes Intravenous  Once 01/22/24 1309 01/22/24 1319   01/22/24 1315  ceFEPIme  (MAXIPIME ) 2 g in sodium chloride  0.9 % 100 mL IVPB        2 g 200 mL/hr over 30 Minutes Intravenous  Once 01/22/24 1309 01/22/24 1515   01/22/24 1315  vancomycin  (VANCOREADY) IVPB 1750 mg/350 mL  Status:  Discontinued        1,750 mg 175 mL/hr over 120 Minutes Intravenous  Once 01/22/24 1311 01/22/24 1756       Procedures: 02-01-2024 LHC LM: Distal 45% stenosis,  LAD: Ostially occluded stent with acute stent thrombosis (Culprit vessel),  [place]: 50% in-stent restenosis,  Lcx: Mild diffuse disease,  RCA: Not engaged today. LVEDP 27 mmHg.  Successful percutaneous coronary intervention ostial prox LAD.  PTCA with serial balloon dilatation, up to 3.0  X 12 mm [place] balloon up to 20 atm 02-18-2024 endoscopic colonic decompression with tube placement 02-21-2024 TEE/DCCV successful. Back to NSR.  Consultants: PCCM Cardiology General surgery    Assessment and Plan: * Pneumatosis intestinalis of large intestine-resolved as of 02/16/2024 01-22-2024 to 01-29-2024.  admitted to ICU by PCCM. General surgery consulted. CT abd showed pneumatosis. Surgery did not feel that pt was a operative candidate. IV abx started. Pt developed gastric distension and ileus.  Due to N/V and ileus, NG tube was placed for gastric decompression. By 01-26-2024, abd distension improved, NG tube removed and clear liquid diet started.   AKI (acute kidney injury) 01-22-2024 through 01-28-2024. Admitted with AKI with Scr of 2.55. with IVF and treatment of his ileus/intestinal pneumatosis, his scr return to 1.07 by 01-28-2024  02-08-2024 through 02-11-2024 he develops another AKI during his cardiogenic shock. By 02-10-2024, scr back to 1.06  02-13-2024 scr back on the rise again after uncontrolled afib and starting of  aldcatone/demadex .  02/17/24 Scr stable at 1.4 - 1.5 on demadex  and aldactone .  02/18/24 Scr stable at 1.4.  02/19/24 Scr up to 1.59. cards wants to continue demadex /aldactone  despite rising Scr.   02/20/24 Scr improved to 1.39 today. Was 1.59 yesterday.  Pt received multiple runs of IV KCL and IV Mg yesterday which were given with carrier   02/21/24 Scr 1.55 today.  His baseline Scr of 0.7-0.8. AKI cause is multifactorial from cardiogenic shock, acute CHF, rapid afib. More recently, his Scr has been uptrending with continue demadex /aldactone . Cardiology continues to recommend continued treatment with demadex /aldactone . Defer to cards on diuretic management and how it impacts his AKI.  02/22/24 Scr 1.37, BUN 25. Stable. Will likely stay at this level since inability to stop diuretics given EF of 20%.   Atrial fibrillation with RVR (HCC) 02-04-2024 pt started on digoxin  and IV amiodarone  for rate control as pt's blood pressure too low to start any other rate controlled meds due to cardiogenic shock.  02-05-2024 through 02-15-2024 afib rate remains uncontrolled. Due to ongoing colonic ileus, cardiology does not want to cardiovert.  02-16-2024 remains on IV amiodarone  gtts. Rate still in the mid 100s. SBP borderline low at 90-100s  02/17/24 pt remains on IV amiodarone  and eliquis . HR still uncontrolled. Cardiology managing his rapid afib.  02/18/24 on digoxin .  remains on IV amiodarone  and eliquis . Cards managing afib. Potential for DCCV after colonic issues resolved.  02/19/24 remains on IV amiodarone  gtts and eliquis . Cards thinking about possible DCCV on Monday. Attempt to keep serum K >4.0 and Mg > 2.0  02/20/24 continue IV amiodarone . With his frequent liquid Bms, having a hard time keeping up with his potassium losses. Will give more IV and po kcl today. Mg level looks ok. Cards looking to see if pt can get DCCV tomorrow. Will make him NPO after MN in case.  02/21/24 cards to decide  if pt needs DCCV today. Colonic ileus seems to have resolved. Remains on Eliquis  and IV amiodarone .  02/22/24 s/p DCCV on 02-22-2024. Cardiology to decide on IV amiodarone . Remains on Eliquis  and digoxin .    Colonic pseudoobstruction 01-27-2025 GI consulted due to colonic ileus that was not responding to conservative measures. Pt has persistent colonic distension.  GI recommended to continue Flexiseal and to treat empiric treatment with flagyl  for presumed SIBO(small intestine bacterial overgrowth).  02-01-2024 pt develops rectal bleeding and drops his HgB from 9.2 g/dl to 7.3 g/dl.  02-02-2024 pt taken for flex sigmoidoscopy and EGD that shows bright red blood in the colon from the rectum to the transverse colon underlying mucosa appeared normal few diverticuli noted in the left and sigmoid no active bleeding, single nonbleeding superficial ulcer in the rectum.  EGD showed superficial esophageal ulcer with no bleeding, grade C esophagitis with no bleeding and a few erosions in the first portion of the duodenum  02-03-2024 to 02-10-2024 persistent abd distension despite liquid bowel movements.    02-11-2024 Xifaxan  started for additional SIBO treatment due to persistent abdominal distension.   02-15-2024 after 72 hours of no improvement with treatment with Xifaxan  for SIBO, pt started on SQ neostigmine  with IV robinul  to help with colonic decompression.  02-16-2024 GI continuing with SQ neostigmine  q6h.  02-17-2024 GI managing colonic ileus. Abd XR ordered. Pt may need endoscopic decompression. GI has made pt NPO.  02/18/24 Pt states he has not had any BM overnight. Appears he was given 1 mg IV neostigmine  instead of SQ injection. He states he was extremely nauseated with IV neostigmine . Today, Abd XR still show extremely dilated colon. XR no changes from yesterday.  02-19-2024 s/p colonoscopy with decompression tube placed yesterday. Neostigmine  stopped on 02-18-2024.  02/20/24 still doing  well. Having liquid Bms. Abd remains soft. GI to decide when colonic decompression tube can be removed.  02/21/24 still with colonic decompression tube. Passing flatus and liquid stools. Abd is nice and soft now.  02/22/24 GI has advanced his diet to regular foot. Still has colonic decompression tube. Abd remains soft.   Small intestinal bacterial overgrowth (SIBO) 01-27-2025 GI consulted due to colonic ileus that was not responding to conservative measures. Pt has persistent colonic distension.  GI recommended to continue Flexiseal and to treat empiric treatment with flagyl  for presumed SIBO(small intestine bacterial overgrowth).   02-03-2024 to 02-10-2024 persistent abd distension despite liquid bowel movements.     02-11-2024 Xifaxan  started for additional SIBO treatment due to persistent abdominal distension.    02-15-2024 after 72 hours of no improvement with treatment with Xifaxan  for SIBO, pt started on IV neostigmine  with IV robinul  to help with colonic decompression.   02-16-2024 GI continuing with SQ neostigmine  q6h.  02/17/24 remains on Xifaxan  TID. GI treating  him for 14 days. End date 02-24-2024. During prior admission, flagyl  seemed to work but no longer.  02/18/24 continues with Xifaxan  TID. End date of therapy 02-24-2024.  02/19/24 continues on Xifaxan . End date of therapy 02-24-2024. This will completed 14 days of treatment.  02/20/24 on Xifaxan . End date 02-24-2024. This is 14 days of therapy.  02/22/24 continue on Xifaxan . End date of therapy 02-24-2024.  Acute on chronic systolic CHF (congestive heart failure) (HCC) 02-02-2024 pt develops cardiogenic shock after STEMI requiring PTCA. Pt placed on milrinone  for cardiovascular support.  02-04-2024 through 02-09-2024 pt started on IV lasix  gtts for diuresis while he has cardiogenic shock to help remove intravascular volume  02-14-2024 po demadex  and aldactone  started.  02/17/24 stable on po demadex  and aldactone . Scr  1.4-1.5 over the last 4-5 days.  02/18/24 stable. On demadex  and aldactone .  02/19/24 Scr up to 1.59. cards wants to continue demadex /aldactone  despite rising Scr.  02/20/24 Scr 1.39, BUN 34 today. Pt received multiple runs of IV KCL and IV Mg yesterday which were given with carrier  IVF. Cards wants to continue demadex /aldactone .  02/21/24 Scr at 1.55 today. Consistent with mild AKI. Cards continues to want demadex /aldactone .  02/21/24 AKI persistent with Scr 1.55. management of GDMT per cards.  02/22/24 Scr 1.37, BUN 25, stable. Remains on demadex /aldactone . Cards to decide on GDMT. Right now, BP still too low to add any additional meds. Remains on digoxin .   Hypokalemia 02/18/24 K 3.4 today. Given rapid afib and NPO status. Will give IV Kcl replacement.  02/19/24 K 3.5 today. Will give more KCL due to afib.  02/20/24 K 3.3 today. Give po and IV kcl. Will add scheduled doses of Kdur in the evening.  02/21/24 finally, K >4.0 today. K 4.4 today. Continue with kcl 40 meq doses at 6 pm and 10 pm at nightime.  02/22/24 keep K >4.0. today, K 3.9. give extra Kcl.  Cardiogenic shock (HCC)-resolved as of 02/16/2024 02-01-2024 after his STEMI from in-stent restenosis requiring PCTA on 02-01-2024. Pt develops cardiogenic shock requiring IV milrinone  and transfer to CICU.  02-02-2024 to 02-09-2024 echo shows LVEF dropped to 20-25%. Pt kept on milrinone  until 02-09-2024 when it was eventually weaned off.  Anemia due to acute blood loss-resolved as of 02/16/2024 GI bleed.   SP 4 units PRBC transfusion.   EGD with flexible sigmoidoscopy with red blood in the colon.  Grade 1 esophagitis.  Continue pantoprazole .  Follow up hgb is 11.3   Acute blood loss anemia-resolved as of 02/16/2024 02-01-2024 pt develops rectal bleeding and drops his HgB from 9.2 g/dl to 7.3 g/dl.  02-02-2024 pt transfused 2 units PRBC.  pt taken for flex sigmoidoscopy and EGD that shows bright red blood in the colon from  the rectum to the transverse colon underlying mucosa appeared normal few diverticuli noted in the left and sigmoid no active bleeding, single nonbleeding superficial ulcer in the rectum.  EGD showed superficial esophageal ulcer with no bleeding, grade C esophagitis with no bleeding and a few erosions in the first portion of the duodenum  Gastrointestinal hemorrhage-resolved as of 02/16/2024 02-01-2024 pt develops rectal bleeding and drops his HgB from 9.2 g/dl to 7.3 g/dl.   02-02-2024 pt taken for flex sigmoidoscopy and EGD that shows bright red blood in the colon from the rectum to the transverse colon underlying mucosa appeared normal few diverticuli noted in the left and sigmoid no active bleeding, single nonbleeding superficial ulcer in the rectum.  EGD showed superficial esophageal  ulcer with no bleeding, grade C esophagitis with no bleeding and a few erosions in the first portion of the duodenum  Type 2 diabetes mellitus with hyperlipidemia (HCC) 02-16-2024 CBG stable without SSI  02/17/24 CBG stable  02/18/24 stable CBG  02/19/24 CBG stable.  02/20/24 stable  02/21/24 stable    Pressure injury of skin Wound 02/01/24 1600 Pressure Injury Buttocks Stage 2 -  Partial thickness loss of dermis presenting as a shallow open injury with a red, pink wound bed without slough. (Active)    Malnutrition of moderate degree Nutrition Status: Nutrition Problem: Moderate Malnutrition Etiology: acute illness Signs/Symptoms: energy intake < 75% for > 7 days, moderate muscle depletion, mild muscle depletion Interventions: Boost Breeze, MVI, Refer to RD note for recommendations    Essential hypertension 02-17-2024 unable to start HTN meds due to low BP  02/18/24 BP still borderline low due to rapid afib.  02/19/24 BP low today. Cannot be on any further HTN meds due to low BP.  02/20/24 BP too low to add any additional meds. Maybe if he is back in NSR, his BP will increase  02/21/24  unable to add any additional HTN meds due to low BP.   Coronary artery disease involving native coronary artery of native heart without angina pectoris 09-09--2025 pt has in-stent thrombosis causing STEMI. Taken to cath lab for [facility] to PTCA.  02-16-2024 pt on Brilinta . ASA on hold due to GI bleed on 02-02-2024.  Restart crestor  20 mg daily.  02/17/24 remains on Brilinta  and Eliquis . No ASA due to GI bleeding on 02-02-2024.  02/18/24 stable on Eliquis  and Brilinta . Cards holding on ASA due to GI bleeding on 02-02-2024  02/19/24 on Eliquis  and Brilinta . ASA on hold due to GI bleeding on 02-02-2024.  02/20/24 on Brilinta  and Eliquis . Off ASA due to GI bleeding on 02-02-2024.  02/21/24 pt's plavix  assay came back as he is a rapid metabolizer of plavix . Cards to determine how that impacts his CAD.    02/22/24 appears cardiology has loaded pt with 300 mg plavix  and then 75 mg daily plavix . Off Brilinta  now. Continues on Eliquis  and crestor . No betablocker due to low BP.   ST elevation myocardial infarction involving left main coronary artery (HCC)-resolved as of 02/16/2024 02-01-2024 At around 1:15 PM, patient started having right shoulder pain and started feeling diaphoretic. EKG showed new anterolateral ST elevation. Cardiology felt that pt likely had occluded his recently placed LAD stent with several days of likely suboptimal platelet inhibition. Pt taken emergently to cath lab where LHC showed LAD: Ostially occluded stent with acute stent thrombosis. Pt subsequently develops cardiogenic shock and requires IV milrinone  for 7 days.   DNR (do not resuscitate) Made DNR/DNI on 01-29-2024 by palliative care     DVT prophylaxis: Place and maintain sequential compression device Start: 02/04/24 1132 Place TED hose Start: 02/04/24 0941 SCD's Start: 02/01/24 1555 apixaban  (ELIQUIS ) tablet 5 mg     Code Status: Do not attempt resuscitation (DNR) PRE-ARREST INTERVENTIONS DESIRED Family Communication:  no family at bedside Disposition Plan: SNF vs CIR Reason for continuing need for hospitalization: medically stable  Objective: Vitals:   02/21/24 2256 02/22/24 0352 02/22/24 0734 02/22/24 1050  BP: 106/69 102/71 100/66 110/69  Pulse: 87 78 69 69  Resp: 20 20 20 20   Temp: 98.3 F (36.8 C) 97.9 F (36.6 C) 97.8 F (36.6 C) 97.8 F (36.6 C)  TempSrc: Oral Oral Oral Oral  SpO2: 97% 95% 93% 93%  Weight:  76.8  kg    Height:        Intake/Output Summary (Last 24 hours) at 02/22/2024 1155 Last data filed at 02/22/2024 0353 Gross per 24 hour  Intake 1626.44 ml  Output 1800 ml  Net -173.56 ml   Filed Weights   02/20/24 0300 02/21/24 0532 02/22/24 0352  Weight: 77.7 kg 77.9 kg 76.8 kg    Examination:  Physical Exam Vitals and nursing note reviewed.  Constitutional:      General: He is not in acute distress.    Appearance: He is not toxic-appearing.  HENT:     Head: Normocephalic and atraumatic.  Cardiovascular:     Rate and Rhythm: Normal rate and regular rhythm.  Pulmonary:     Effort: Pulmonary effort is normal.     Breath sounds: Normal breath sounds.  Abdominal:     General: Bowel sounds are normal. There is no distension.     Palpations: Abdomen is soft.  Musculoskeletal:     Comments: Trace pretibial edema  Skin:    General: Skin is warm and dry.     Capillary Refill: Capillary refill takes less than 2 seconds.  Neurological:     Mental Status: He is alert and oriented to person, place, and time.     Data Reviewed: I have personally reviewed following labs and imaging studies  CBC: Recent Labs  Lab 02/18/24 0440 02/19/24 0400 02/20/24 0537 02/21/24 0550 02/22/24 0553  WBC 4.3 4.5 6.5 9.0 8.7  HGB 10.9* 10.2* 10.7* 11.4* 11.4*  HCT 34.2* 31.4* 33.8* 35.5* 35.1*  MCV 87.9 88.5 88.9 88.8 87.3  PLT 649* 623* 567* 568* 486*   Basic Metabolic Panel: Recent Labs  Lab 02/18/24 0440 02/19/24 0400 02/20/24 0537 02/21/24 0550 02/22/24 0553  NA 133*  133* 133* 134* 134*  K 3.4* 3.5 3.3* 4.4 3.9  CL 97* 100 102 107 102  CO2 21* 19* 19* 17* 19*  GLUCOSE 228* 235* 197* 113* 185*  BUN 39* 41* 34* 34* 25*  CREATININE 1.40* 1.59* 1.39* 1.55* 1.37*  CALCIUM  8.1* 8.1* 8.1* 8.5* 8.3*  MG 2.1 2.0 2.2 2.2 2.0  PHOS  --  4.3  --   --   --    GFR: Estimated Creatinine Clearance: 49.8 mL/min (A) (by C-G formula based on SCr of 1.37 mg/dL (H)). Liver Function Tests: Recent Labs  Lab 02/18/24 0440 02/19/24 0400 02/20/24 0537 02/21/24 0550 02/22/24 0553  AST 13* 13* 12* 15 18  ALT 15 13 13 14 15   ALKPHOS 68 69 64 71 66  BILITOT 0.8 0.5 0.6 0.7 0.6  PROT 5.8* 5.4* 5.6* 5.9* 5.6*  ALBUMIN 2.7* 2.6* 2.6* 2.8* 2.7*   BNP (last 3 results) Recent Labs    01/11/24 0539 01/13/24 0537 01/22/24 1243  BNP 842.8* 1,747.3* 220.4*   CBG: Recent Labs  Lab 02/15/24 1559 02/15/24 2100 02/16/24 0601 02/16/24 2058 02/18/24 2122  GLUCAP 116* 131* 116* 166* 148*   Radiology Studies: EP STUDY Result Date: 02/21/2024 See surgical note for result.   Scheduled Meds:  apixaban   5 mg Oral BID   Chlorhexidine  Gluconate Cloth  6 each Topical Q0600   [START ON 02/23/2024] clopidogrel   75 mg Oral Daily   digoxin   0.0625 mg Oral Daily   feeding supplement  1 Container Oral TID BM   Gerhardt's butt cream   Topical BID   magic mouthwash  5 mL Oral QID   melatonin  5 mg Oral QHS   multivitamin with minerals  1  tablet Oral Daily   nystatin    Topical BID   pantoprazole   40 mg Oral BID   potassium chloride   40 mEq Oral BID   potassium chloride   40 mEq Oral Q4H   rifaximin   550 mg Oral TID   rosuvastatin   20 mg Oral Daily   sodium chloride  flush  10-40 mL Intracatheter Q12H   sodium chloride  flush  3 mL Intravenous Q12H   spironolactone   25 mg Oral Daily   torsemide   40 mg Oral Daily   Continuous Infusions:  amiodarone  30 mg/hr (02/22/24 0057)     LOS: 31 days   Time spent: 60 minutes  Camellia Door, DO  [facility]  02/22/2024,  11:55 AM

## 2024-02-22 NOTE — Progress Notes (Signed)
 Inpatient Rehab Coordinator Note:  I spoke with patient's spouse, [name], over the phone to discuss CIR recommendations and goals/expectations of CIR stay.  We reviewed 3 hrs/day of therapy, physician follow up, and average length of stay 2 weeks (dependent upon progress) with goals of modified independent/intermittent supervision for OT.  She and spouse are in agreement to pursue CIR.  I explained insurance auth process and I will start that today.    Reche Lowers, PT, DPT Admissions Coordinator [phone] 02/22/24  2:06 PM

## 2024-02-22 NOTE — Plan of Care (Signed)

## 2024-02-22 NOTE — Anesthesia Postprocedure Evaluation (Signed)
 Anesthesia Post Note  Patient: Chase Combs  Procedure(s) Performed: CARDIOVERSION TRANSESOPHAGEAL ECHOCARDIOGRAM     Patient location during evaluation: Cath Lab Anesthesia Type: General Level of consciousness: awake and alert Pain management: pain level controlled Vital Signs Assessment: post-procedure vital signs reviewed and stable Respiratory status: spontaneous breathing, nonlabored ventilation, respiratory function stable and patient connected to nasal cannula oxygen Cardiovascular status: stable and blood pressure returned to baseline Postop Assessment: no apparent nausea or vomiting Anesthetic complications: no   There were no known notable events for this encounter.  Last Vitals:  Vitals:   02/22/24 0352 02/22/24 0734  BP: 102/71 100/66  Pulse: 78 69  Resp: 20 20  Temp: 36.6 C 36.6 C  SpO2: 95% 93%    Last Pain:  Vitals:   02/22/24 0734  TempSrc: Oral  PainSc:                  [name] S

## 2024-02-22 NOTE — Progress Notes (Addendum)
 Physical Therapy Treatment Patient Details Name: Chase Combs MRN: [id number] DOB: 11-Aug-1945 Today's Date: 02/22/2024   History of Present Illness 78 year old man admitted 8/30 with progressive nausea vomiting with CT images concerning for ischemic colitis; pneumatosis intestinalis/ileus. 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 Hospitalization complicated by probable right-sided ischemic colitis and transient lower GI bleeding in setting of Brilinta /aspirin /Eliquis .     Patient subsequently developed a small and large bowel ileus/pseudo obstruction which has been refractory.  Patient s/p colonoscopic decompression 9/26 with significant improvement in his abdominal distention and discomfort. PMH: ischemic cardiomyopathy and very recent LAD stent placement for NSTEMI discharged 8/27.    PT Comments  Pt received in supine, requesting assist off bed pan, agreeable to therapy session after clean-up, totalA for hygiene assist and modI rolling to L/R off of bed pan. Pt performs bed mobility with HOB elevated to simulate transfer to EOB as he sleeps in recliner. Upon standing, pt without c/o distress but after short household distance gait trial ~63ft, pt c/o increased dizziness so VS assessed and pt noted to be orthostatic with sit<>stand transfers. Symptoms initially mild so pt able to perform x3 total short household distance gait trials with close chair follow for safety and assist with multiple lines. SpO2 92% and above on RA with exertional tasks today. Pt needing occasional minA to correct LOB with ambulation and min/modA for stand>sit transfer safety as he fatigued with poor eccentric control to sit, but only needing safety cues/CGA for sit>stand to RW. Patient will benefit from intensive inpatient follow-up therapy, >3 hours/day   Vital Signs  Patient Position (if appropriate) Orthostatic Vitals  Orthostatic Sitting  BP- Sitting 91/69  Pulse- Sitting 79  Orthostatic Standing at 0 minutes   BP- Standing at 0 minutes (!) 79/59  Pulse- Standing at 0 minutes  (did not record, helping patient)  Orthostatic Standing at 3 minutes  BP- Standing at 3 minutes  (defer, pt ambulating and needing +2 for safety/lines)   Orthostatic Lying   BP- Lying 105/78 (Recliner w/Combs >65 deg)  Pulse- Lying 78     If plan is discharge home, recommend the following: Assistance with cooking/housework;Assist for transportation;A little help with bathing/dressing/bathroom;A lot of help with walking and/or transfers;Help with stairs or ramp for entrance (+2 safety with amb)   Can travel by private vehicle        Equipment Recommendations  None recommended by PT (TBD pending progress)    Recommendations for Other Services Rehab consult     Precautions / Restrictions Precautions Precautions: Fall Recall of Precautions/Restrictions: Intact Precaution/Restrictions Comments: history of orthostatic hypotension; not on O2 baseline; colonic decompression tube and LUE triple lumen catheter Restrictions Weight Bearing Restrictions Per Provider Order: No     Mobility  Bed Mobility Overal bed mobility: Needs Assistance Bed Mobility: Supine to Sit, Rolling Rolling: Modified independent (Device/Increase time), Used rails   Supine to sit: HOB elevated, Used rails, Contact guard     General bed mobility comments: Rolling L/R prior to EOB for peri-care after using bed pan (NT present to assist with totalA for that portion of session) then CGA for transfer to EOB with use of hospital bed features, HOB up as pt reports he sleeps in a recliner at home. increased time to initiate and perform per patient request.    Transfers Overall transfer level: Needs assistance Equipment used: Rolling walker (2 wheels) Transfers: Sit to/from Stand, Bed to chair/wheelchair/BSC Sit to Stand: Contact guard assist, Min assist  General transfer comment: EOB (lower height)>RW and recliner chair<>RW x 3  reps, with CGA until final stand>sit when pt had poor eccentric control and needing min/modA for safety to sit in chair. Cues for safe UE placement with stand>sit needed as pt internally distracted and does not always remember to reach back.    Ambulation/Gait Ambulation/Gait assistance: Contact guard assist, Min assist, +2 safety/equipment Gait Distance (Feet): 56 Feet (41ft, seated break, 66ft, seated break, 37ft) Assistive device: Rolling walker (2 wheels) Gait Pattern/deviations: Step-through pattern, Decreased stride length, Trunk flexed, Staggering right (Bil genu varum)       General Gait Details: Close chair follow for safety with pt c/o dizziness at 90ft, pt took seated break for BP assessment, then upon standing pt BP taken and pt noted to be orthostatic, but mild symptoms of dizziness and pt requesting to walk with chair follow again. Mild lateral LOB with pt fatigue needing minA and RW support to correct x2 occasions. Pt sits very abruptly after third short gait trial.   Stairs Stairs:  (defer, symptomatic orthostatic hypotension)           Wheelchair Mobility     Tilt Bed    Modified Rankin (Stroke Patients Only)       Balance Overall balance assessment: Needs assistance Sitting-balance support: Feet supported Sitting balance-Leahy Scale: Good     Standing balance support: Bilateral upper extremity supported, Single extremity supported, No upper extremity supported Standing balance-Leahy Scale: Poor Standing balance comment: Reliant on RW, lateral LOB with turning x2                            Communication Communication Communication: No apparent difficulties Factors Affecting Communication: Hearing impaired  Cognition Arousal: Alert Behavior During Therapy: WFL for tasks assessed/performed, Flat affect   PT - Cognitive impairments: Memory                       PT - Cognition Comments: Pt asks this therapist name and does not seem  to recall working with PTA although he has been seen on this admission by PTA 4 sessions. Pt apologetic for his attitude although he was cooperative as able. Following commands: Intact      Cueing Cueing Techniques: Verbal cues, Gestural cues  Exercises      General Comments General comments (skin integrity, edema, etc.): TED hose donned throughout but needed assist at end of session to adjust where they had slid up his feet to his ankles. see BP in comments above, RN/MD notified of BP drop with standing      Pertinent Vitals/Pain Pain Assessment Pain Assessment: Faces Faces Pain Scale: Hurts a little bit Pain Location: generalized Pain Descriptors / Indicators: Grimacing, Guarding Pain Intervention(s): Monitored during session, Repositioned    Home Living                          Prior Function            PT Goals (current goals can now be found in the care plan section) Acute Rehab PT Goals Patient Stated Goal: get well, return home, maybe rehab before home PT Goal Formulation: With patient Time For Goal Achievement: 02/19/24 Progress towards PT goals: Progressing toward goals    Frequency    Min 3X/week      PT Plan      Co-evaluation PT/OT/SLP Co-Evaluation/Treatment: Yes Reason for Co-Treatment:  Complexity of the patient's impairments (multi-system involvement);For patient/therapist safety;To address functional/ADL transfers PT goals addressed during session: Mobility/safety with mobility;Balance;Proper use of DME        AM-PAC PT 6 Clicks Mobility   Outcome Measure  Help needed turning from your back to your side while in a flat bed without using bedrails?: A Little Help needed moving from lying on your back to sitting on the side of a flat bed without using bedrails?: A Little Help needed moving to and from a bed to a chair (including a wheelchair)?: A Little Help needed standing up from a chair using your arms (e.g., wheelchair or bedside  chair)?: A Little Help needed to walk in hospital room?: A Lot (due to need for chair follow) Help needed climbing 3-5 steps with a railing? : Total 6 Click Score: 15    End of Session Equipment Utilized During Treatment: Gait belt Activity Tolerance: Other (comment);Patient tolerated treatment well;Treatment limited secondary to medical complications (Comment) (orthostatic hypotension with standing) Patient left: with call bell/phone within reach;in chair;with chair alarm set Nurse Communication: Mobility status;Other (comment);Precautions (symptomatic orthostatic hypotension) PT Visit Diagnosis: Unsteadiness on feet (R26.81);Other abnormalities of gait and mobility (R26.89);Muscle weakness (generalized) (M62.81);Difficulty in walking, not elsewhere classified (R26.2)     Time: 9071-9043 PT Time Calculation (min) (ACUTE ONLY): 28 min  Charges:    $Gait Training: 8-22 mins PT General Charges $$ ACUTE PT VISIT: 1 Visit                     Kyliana Standen P., PTA Acute Rehabilitation Services Secure Chat Preferred 9a-5:30pm Office: [phone]    [name] [facility] 02/22/2024, 10:27 AM

## 2024-02-23 DIAGNOSIS — K5981 Ogilvie syndrome: Secondary | ICD-10-CM | POA: Diagnosis not present

## 2024-02-23 DIAGNOSIS — N179 Acute kidney failure, unspecified: Secondary | ICD-10-CM | POA: Diagnosis not present

## 2024-02-23 DIAGNOSIS — E440 Moderate protein-calorie malnutrition: Secondary | ICD-10-CM | POA: Diagnosis not present

## 2024-02-23 DIAGNOSIS — Z515 Encounter for palliative care: Secondary | ICD-10-CM | POA: Diagnosis not present

## 2024-02-23 DIAGNOSIS — I5023 Acute on chronic systolic (congestive) heart failure: Secondary | ICD-10-CM | POA: Diagnosis not present

## 2024-02-23 DIAGNOSIS — Z66 Do not resuscitate: Secondary | ICD-10-CM

## 2024-02-23 LAB — COMPREHENSIVE METABOLIC PANEL WITH GFR
ALT: 19 U/L (ref 0–44)
AST: 19 U/L (ref 15–41)
Albumin: 2.8 g/dL — ABNORMAL LOW (ref 3.5–5.0)
Alkaline Phosphatase: 74 U/L (ref 38–126)
Anion gap: 7 (ref 5–15)
BUN: 25 mg/dL — ABNORMAL HIGH (ref 8–23)
CO2: 19 mmol/L — ABNORMAL LOW (ref 22–32)
Calcium: 8.7 mg/dL — ABNORMAL LOW (ref 8.9–10.3)
Chloride: 106 mmol/L (ref 98–111)
Creatinine, Ser: 1.45 mg/dL — ABNORMAL HIGH (ref 0.61–1.24)
GFR, Estimated: 50 mL/min — ABNORMAL LOW
Glucose, Bld: 120 mg/dL — ABNORMAL HIGH (ref 70–99)
Potassium: 4.8 mmol/L (ref 3.5–5.1)
Sodium: 132 mmol/L — ABNORMAL LOW (ref 135–145)
Total Bilirubin: 0.6 mg/dL (ref 0.0–1.2)
Total Protein: 6 g/dL — ABNORMAL LOW (ref 6.5–8.1)

## 2024-02-23 LAB — COOXEMETRY PANEL
Carboxyhemoglobin: 1.4 % (ref 0.5–1.5)
Methemoglobin: 0.8 % (ref 0.0–1.5)
O2 Saturation: 59 %
Total hemoglobin: 12.6 g/dL (ref 12.0–16.0)

## 2024-02-23 LAB — CBC
HCT: 37.5 % — ABNORMAL LOW (ref 39.0–52.0)
Hemoglobin: 12.2 g/dL — ABNORMAL LOW (ref 13.0–17.0)
MCH: 28.2 pg (ref 26.0–34.0)
MCHC: 32.5 g/dL (ref 30.0–36.0)
MCV: 86.8 fL (ref 80.0–100.0)
Platelets: 505 K/uL — ABNORMAL HIGH (ref 150–400)
RBC: 4.32 MIL/uL (ref 4.22–5.81)
RDW: 19.2 % — ABNORMAL HIGH (ref 11.5–15.5)
WBC: 10 K/uL (ref 4.0–10.5)
nRBC: 0 % (ref 0.0–0.2)

## 2024-02-23 LAB — MAGNESIUM: Magnesium: 2.1 mg/dL (ref 1.7–2.4)

## 2024-02-23 NOTE — Assessment & Plan Note (Signed)
 Recent STEMI with stent thrombosis.  Initially placed on ticagrelor , holding asa due to GI bleeding.  Continue statin.   Noted patient is a rapid metabolism for clopidogrel .  Currently transitioned to clopidogrel  75 mg daily after a bolus of 300 mg daily.  Currently patient will continue clopidogrel  and DOAC with apixaban .

## 2024-02-23 NOTE — Assessment & Plan Note (Signed)
 Echocardiogram with reduced LV systolic function EF 20 to 25%, global hypokinesis, moderate LVH, diastolic dysfunction with EA fusion,  RV systolic function moderately reduced, mild RV enlargement, RVSP 30.7 mmHg, moderate mitral valve regurgitation, mild to moderate TR,   Resolved cardiogenic shock.    Urine output 1,100 ml.   Systolic blood pressure 100 mmHg range Sv02 59   Off milrinone   Medical therapy with digoxin  and spironolactone . Continue to hold on loop diuretic for now.

## 2024-02-23 NOTE — Progress Notes (Signed)
 Inpatient Rehab Admissions Coordinator:   Prior auth for CIR pending with [facility] Medicare.  Will follow.   Reche Lowers, PT, DPT Admissions Coordinator [phone] 02/23/24  10:09 AM

## 2024-02-23 NOTE — Progress Notes (Signed)
 Daily Progress Note   Patient Name: Chase Combs       Date: 02/23/2024 DOB: 1945/10/21  Age: 78 y.o. MRN#: [id number] Attending Physician: Noralee Elidia Toribio DEWAINE Primary Care Physician: Wonda Worth SQUIBB, PA Admit Date: 01/22/2024  Reason for Consultation/Follow-up: Establishing goals of care  Subjective: Medical records reviewed including progress notes, labs and imaging. Patient assessed at the bedside. He appears better than my last in-person visit on 9/12, reports feeling better as well though still tired. No family was present during my visit.  Created space and opportunity for patient's thoughts and feelings on his current illness. Reviewed course of hospitalization over the past couple of weeks and anticipated plan for CIR. He confirms that his goals of care remain consistent (return home and take care of himself/his home, enjoy retirement with his wife) and he is open to all available interventions that could help him reach these goals. Outpatient palliative care was explained and offered. He will consider this after CIR, understanding that things can change and he may or may not need this added layer of support.  Questions and concerns addressed.  Length of Stay: 32  Physical Exam Vitals and nursing note reviewed.  Constitutional:      General: He is not in acute distress.    Appearance: He is ill-appearing.  HENT:     Head: Normocephalic and atraumatic.  Cardiovascular:     Rate and Rhythm: Normal rate.  Pulmonary:     Effort: Pulmonary effort is normal.  Skin:    General: Skin is warm and dry.  Neurological:     Mental Status: He is alert and oriented to person, place, and time.            Vital Signs: BP 100/75 (BP Location: Right Arm)   Pulse 61   Temp 98.9 F (37.2  C) (Oral)   Resp 19   Ht 6' (1.829 m)   Wt 75.8 kg   SpO2 94%   BMI 22.66 kg/m  SpO2: SpO2: 94 % O2 Device: O2 Device: Room Air O2 Flow Rate: O2 Flow Rate (L/min): 10 L/min   Palliative Care Assessment & Plan   Patient Profile: 78 y.o. male  with past medical history of CAD/recent non-STEMI requiring LAD stent placement and discharged from the hospital on 01/19/2024, ischemic cardiomyopathy, recent decrease in EF from 40% to 25% by recent echocardiogram on 01/13/2024 compared to 07/12/2023, new onset A-fib with RVR, hypertension, hypercholesterolemia, diabetes mellitus, obesity admitted on 01/22/2024 with nausea and vomiting.    CT images concerning for intestinal pneumatosis with ischemic versus infectious colitis.  Patient was in septic shock, admitted to ICU under PCCM service.  He was started on broad-spectrum antibiotics.  Cardiology and general surgery consulted.  Patient was started on TPN. GI seeing. 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   Assessment: Goals of care conversation A/C HFrEF, ICM   recent STEMI d/t stent thrombosis Intestinal pneumatosis w/ concern for ischemic colitis Afib with RVR  Recommendations/Plan: Continue DNR Goals remain clear for full scope of care otherwise and return home to enjoy retirement and function after CIR GOC documented in [place] Psychosocial and emotional support provided PMT will continue to follow peripherally  and support as needed  Care plan was discussed with patient    Mishell Donalson, PA-C Palliative Medicine Team Team phone # [phone]  Thank you for allowing the Palliative Medicine Team to assist in the care of this patient. Please utilize secure chat with additional questions, if there is no response within 30 minutes please call the above phone number.  Palliative Medicine Team providers are available by phone from 7am to 7pm daily and can be reached through the team cell phone.  Should this  patient require assistance outside of these hours, please call the patient's attending physician.  Portions of this note are a verbal dictation therefore any spelling and/or grammatical errors are due to the Dragon Medical One system interpretation.    Time Total: 35  Visit consisted of counseling and education dealing with the complex and emotionally intense issues of symptom management and palliative care in the setting of serious and potentially life-threatening illness. Greater than 50% of this time was spent counseling and coordinating care related to the above assessment and plan.  Personally spent 35 minutes in patient care including extensive chart review (labs, imaging, progress/consult notes, vital signs), medically appropraite exam, discussed with treatment team, education to patient, family, and staff, documenting clinical information, medication review and management, coordination of care, and available advanced directive documents.

## 2024-02-23 NOTE — Assessment & Plan Note (Addendum)
 Intestinal ischemia Intestinal pneumatosis/ colonic adynamic ileus  Pseudo obstruction.  Complicated with sepsis, present on admission.  Completed antibiotic therapy on 09/10 TPN has been weaned off.    Trial of rifaximin  550 mg po tid.  Continue supportive care. Tolerating po well.  Follow up imaging with persistent small bowel and colonic dilatation, trial of neostigmine  was attempted.  Failed trial of neostigmine .  09/26 colonoscopy with segmental moderate inflammation in the proximal transverse colin secondary to ischemic colitis.  Dilatation in the ascending colon in the transverse colon.  Mild diverticulosis in the sigmoid colon and in the ascending colon.  Decompression tube placed successfully.   Continue to improve abdominal distention, now patient having more formed stools.

## 2024-02-23 NOTE — Assessment & Plan Note (Signed)
Continue blood pressure monitoring. Loop diuretic on hold.

## 2024-02-23 NOTE — Assessment & Plan Note (Signed)
 Hyponatremia, hypokalemia.   Patient currently euvolemic. Today renal function with serum cr at 1,34 with K at 4.4 and serum bicarbonate at 20  Na 132    Continue spironolactone  and will resume torsemide  at discharge.  Follow up renal function and electrolytes in 7 days as outpatient.

## 2024-02-23 NOTE — Assessment & Plan Note (Signed)
 GI bleed.    SP 4 units PRBC transfusion.    EGD with flexible sigmoidoscopy with red blood in the colon.  Grade 1 esophagitis.  Continue pantoprazole .  Follow up hgb is 12.2

## 2024-02-23 NOTE — Progress Notes (Signed)
 Physical Therapy Treatment Patient Details Name: Chase Combs MRN: [id number] DOB: [birth year] Today's Date: 02/23/2024   History of Present Illness 78 year old man admitted 8/30 with progressive nausea vomiting with CT images concerning for ischemic colitis; pneumatosis intestinalis/ileus. 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 Hospitalization complicated by probable right-sided ischemic colitis and transient lower GI bleeding in setting of Brilinta /aspirin /Eliquis .     Patient subsequently developed a small and large bowel ileus/pseudo obstruction which has been refractory.  Patient s/p colonoscopic decompression 9/26 with significant improvement in his abdominal distention and discomfort. Cardioversion on 9/29. PMH: ischemic cardiomyopathy and very recent LAD stent placement for NSTEMI discharged 8/27.    PT Comments  Pt tolerates treatment well, ambulating for multiple bouts with support of RW. Pt experiences one leftward loss of balance when ambulating and remains generally weak. Pt is motivated to mobilize, requesting more frequent PT services if possible. PT continues to recommend high intensity inpatient PT services at the time of discharge.    If plan is discharge home, recommend the following: Assistance with cooking/housework;Assist for transportation;A little help with bathing/dressing/bathroom;A lot of help with walking and/or transfers;Help with stairs or ramp for entrance   Can travel by private vehicle        Equipment Recommendations   (TBD pending progress)    Recommendations for Other Services       Precautions / Restrictions Precautions Precautions: Fall Recall of Precautions/Restrictions: Intact Precaution/Restrictions Comments: history of orthostatic hypotension; colonic decompression tube and LUE triple lumen catheter Restrictions Weight Bearing Restrictions Per Provider Order: No     Mobility  Bed Mobility Overal bed mobility: Needs Assistance Bed  Mobility: Supine to Sit     Supine to sit: Supervision          Transfers Overall transfer level: Needs assistance Equipment used: Rolling walker (2 wheels) Transfers: Sit to/from Stand, Bed to chair/wheelchair/BSC Sit to Stand: Contact guard assist   Step pivot transfers: Contact guard assist            Ambulation/Gait Ambulation/Gait assistance: Min assist Gait Distance (Feet): 60 Feet (60' x 2) Assistive device: Rolling walker (2 wheels) Gait Pattern/deviations: Step-through pattern Gait velocity: reduced Gait velocity interpretation: <1.8 ft/sec, indicate of risk for recurrent falls   General Gait Details: slowed step-through gait, one instance of leftward stagger requiring PT assistance to correct   Stairs             Wheelchair Mobility     Tilt Bed    Modified Rankin (Stroke Patients Only)       Balance Overall balance assessment: Needs assistance Sitting-balance support: No upper extremity supported, Feet supported Sitting balance-Leahy Scale: Good     Standing balance support: Bilateral upper extremity supported, Reliant on assistive device for balance Standing balance-Leahy Scale: Poor                              Communication Communication Communication: No apparent difficulties  Cognition Arousal: Alert Behavior During Therapy: WFL for tasks assessed/performed   PT - Cognitive impairments: No apparent impairments                         Following commands: Intact      Cueing Cueing Techniques: Verbal cues  Exercises      General Comments General comments (skin integrity, edema, etc.): VSS on RA, HR in 80-90s with activity, BP negative for orthostatics although  pt does continue to report symptoms of dizziness when mobilizing      Pertinent Vitals/Pain Pain Assessment Pain Assessment: Faces Faces Pain Scale: Hurts little more Pain Location: bottom Pain Descriptors / Indicators: Sore Pain  Intervention(s): Monitored during session    Home Living                          Prior Function            PT Goals (current goals can now be found in the care plan section) Acute Rehab PT Goals Patient Stated Goal: get well, return home, maybe rehab before home Progress towards PT goals: Progressing toward goals    Frequency    Min 3X/week      PT Plan      Co-evaluation              AM-PAC PT 6 Clicks Mobility   Outcome Measure  Help needed turning from your back to your side while in a flat bed without using bedrails?: None Help needed moving from lying on your back to sitting on the side of a flat bed without using bedrails?: A Little Help needed moving to and from a bed to a chair (including a wheelchair)?: A Little Help needed standing up from a chair using your arms (e.g., wheelchair or bedside chair)?: A Little Help needed to walk in hospital room?: A Little Help needed climbing 3-5 steps with a railing? : A Lot 6 Click Score: 18    End of Session   Activity Tolerance: Patient tolerated treatment well Patient left: in chair;with call bell/phone within reach;with chair alarm set;with family/visitor present Nurse Communication: Mobility status PT Visit Diagnosis: Unsteadiness on feet (R26.81);Other abnormalities of gait and mobility (R26.89);Muscle weakness (generalized) (M62.81);Difficulty in walking, not elsewhere classified (R26.2)     Time: 8573-8545 PT Time Calculation (min) (ACUTE ONLY): 28 min  Charges:    $Gait Training: 8-22 mins $Therapeutic Activity: 8-22 mins PT General Charges $$ ACUTE PT VISIT: 1 Visit                     Bernardino JINNY Ruth, PT, DPT Acute Rehabilitation Office [phone]    Bernardino JINNY Ruth 02/23/2024, 3:29 PM

## 2024-02-23 NOTE — Progress Notes (Addendum)
 Advanced Heart Failure Rounding Note  Cardiologist: Alm Clay, MD  HF Consulting Cardiologist: Dr. Zenaida Chief Complaint: Weakness Subjective:    Continues to be weak with standing and ambulation, otherwise feeling well. Awaiting CIR.  Objective:    Weight Range: 75.8 kg Body mass index is 22.66 kg/m.   Vital Signs:   Temp:  [96.9 F (36.1 C)-98.9 F (37.2 C)] 97.7 F (36.5 C) (10/01 1202) Pulse Rate:  [61-81] 69 (10/01 1202) Resp:  [18-20] 19 (10/01 1202) BP: (100-120)/(68-87) 112/77 (10/01 1202) SpO2:  [90 %-97 %] 96 % (10/01 1202) Weight:  [75.8 kg] 75.8 kg (10/01 0500) Last BM Date : 02/22/24  Weight change: Filed Weights   02/21/24 0532 02/22/24 0352 02/23/24 0500  Weight: 77.9 kg 76.8 kg 75.8 kg   Intake/Output:  Intake/Output Summary (Last 24 hours) at 02/23/2024 1232 Last data filed at 02/23/2024 0845 Gross per 24 hour  Intake 480 ml  Output 1275 ml  Net -795 ml    Physical Exam   General: Elderly appearing. Cardiac: S1 and S2 present. No murmurs Extremities: Warm and dry.  Trace BLE edema.  Neuro: Alert and oriented x3. Affect pleasant.   Telemetry   SR 70s (personally reviewed)  Labs    CBC Recent Labs    02/22/24 0553 02/23/24 0323  WBC 8.7 10.0  HGB 11.4* 12.2*  HCT 35.1* 37.5*  MCV 87.3 86.8  PLT 486* 505*   Basic Metabolic Panel Recent Labs    90/69/74 0553 02/23/24 0323  NA 134* 132*  K 3.9 4.8  CL 102 106  CO2 19* 19*  GLUCOSE 185* 120*  BUN 25* 25*  CREATININE 1.37* 1.45*  CALCIUM  8.3* 8.7*  MG 2.0 2.1   Liver Function Tests Recent Labs    02/22/24 0553 02/23/24 0323  AST 18 19  ALT 15 19  ALKPHOS 66 74  BILITOT 0.6 0.6  PROT 5.6* 6.0*  ALBUMIN 2.7* 2.8*    Medications:   Scheduled Medications:  amiodarone   200 mg Oral BID   apixaban   5 mg Oral BID   Chlorhexidine  Gluconate Cloth  6 each Topical Q0600   clopidogrel   75 mg Oral Daily   digoxin   0.0625 mg Oral Daily   feeding supplement  1  Container Oral TID BM   Gerhardt's butt cream   Topical BID   magic mouthwash  5 mL Oral QID   melatonin  5 mg Oral QHS   multivitamin with minerals  1 tablet Oral Daily   nystatin    Topical BID   pantoprazole   40 mg Oral BID   rifaximin   550 mg Oral TID   rosuvastatin   20 mg Oral Daily   sodium chloride  flush  10-40 mL Intracatheter Q12H   sodium chloride  flush  3 mL Intravenous Q12H   spironolactone   25 mg Oral Daily    Infusions:     PRN Medications: acetaminophen , ALPRAZolam , atropine , diphenhydrAMINE , guaiFENesin -dextromethorphan , ipratropium-albuterol , metoCLOPramide  (REGLAN ) injection, mouth rinse, polyethylene glycol, simethicone , sodium chloride  flush, sodium chloride  flush  Patient Profile   Chase Combs is a 78 year old with a history of ICM, chronic biventricular HFrEF, CAD s/p PCI to LAD in 08/25.      Readmitted 01/22/24 with abdominal pain/nausea/vomiting. Hospital course complicated by ischemic colitis w/ septic shock/GI bleed, stent thrombosis involving recent LAD stent and low-output HF.   Assessment/Plan   CAD, recent STEMI d/t stent thrombosis - 01/17/24- PCI/DES LAD  - 02/01/24 Cangrelor  stopped given Plavix  load and ST  elevation. Acute stent thrombosis involving LAD stent treated with PTCA. - Plavix  Genetic Testing collected, pending (sent 9/10, checked with lab 9/22 and still in process) - Switched to Plavix , received load - Off aspirin  with GI bleed.  - Continue high-intensity statin.   New A fib RVR - Given his low output HF, would avoid giving additional IV beta blocker. - Continue Eliquis  5 bid. Hgb stable. - Continue digoxin  0.0625 - s/p successful DCCV yesterday - remains in NSR - continue amio 200 mg bid   A/C HFrEF, ICM   - 01/13/24 Echo EF  20-25% RV moderately reduced.   - 02/01/24 Echo LVEF 20-25% RV moderately reduced.    - Started on milrinone , now off. Co-ox stable - TEE 9/29 with EF 20-25, mod HK RV - Hold diuresis with orthostasis - Off  SGLT2i with fungal rash - Continue spiro 25 mg daily - Continue digoxin  0.0625 mcg daily. Dig level < 0.6. - No BP room to further titrate GDMT - Renal function stable.    Anemia in the setting of GI Bleed -Transfused 9/8 with appropriate rise.  - 9/10 he had large bloody BM. Unfortunately given 600 mg Ibuprofen .  - Protonix  and Pepcid  per GI - GI following.  - S/p EGD and flex sig.  EGD with 1 superficial esophageal ulcer, LA grade C reflux esophagitis with no bleeding. Flex sig: Red blood in the rectum, sigmoid colon, descending colon and transverse colon. Few diverticula found. No obvious source of bleeding, possibly diverticular per GI.  Intestinal pneumatosis w/ concern for ischemic colitis Ileus Psuedoobstruction - Gen surgery recommended conservative management - Treated with antibiotics for suspected septic shock. - Transverse colon measures 9.3 cm on Xray last week.  - CT A/P: 9/22 w/ dilated loops of small bowel and colon w/ air fluid levels in distal colon and rectum w/ obstruction or pneumatosis - Improved with decompression. Appreciate GI.   Hypokalemia - resolved  Orthostatic Hypotension - positive orthostatic vitals yesterday - hold diuresis - needs to wear ted hose   Code status: DNR   Length of Stay: 1  [place] Lee, NP  02/23/2024, 12:32 PM  Advanced Heart Failure Team Pager [phone] (M-F; 7a - 5p)  Please contact [facility] for night-coverage after hours (5p -7a ) and weekends on [email or web address]  Patient seen and examined with the above-signed Advanced Practice Provider and/or Housestaff. I personally reviewed laboratory data, imaging studies and relevant notes. I independently examined the patient and formulated the important aspects of the plan. I have edited the note to reflect any of my changes or salient points. I have personally discussed the plan with the patient and/or family.  Remains in NSR. Still feels weak. Mildly orthostatic. Denies CP or  SOB  General:  Sitting up in bed No resp difficulty HEENT: normal Neck: supple. no JVD. Carotids 2+ bilat; no bruits. No lymphadenopathy or thryomegaly appreciated. Cor: PMI nondisplaced. Regular rate & rhythm. No rubs, gallops or murmurs. Lungs: clear Abdomen: soft, nontender, nondistended. No hepatosplenomegaly. No bruits or masses. Good bowel sounds. Extremities: no cyanosis, clubbing, rash, edema Neuro: alert & orientedx3, cranial nerves grossly intact. moves all 4 extremities w/o difficulty. Affect pleasant  Remains in NSR. Continue Eliquis . HF stable.   Main issues are weakness and mild orthostasis. Continue PT/OT. Place TED hose. Hold diuretics for now.  He is ready for CIR from our standpoint.   Toribio Fuel, MD  7:27 PM

## 2024-02-23 NOTE — Assessment & Plan Note (Signed)
 Glucose has been stable, will hold on insulin  therapy and will check capillary glucose as needed.   Continue statin therapy.

## 2024-02-23 NOTE — Progress Notes (Signed)
 Progress Note   Patient: Chase Combs FMW:[id number] DOB: 1946-01-11 DOA: 01/22/2024     32 DOS: the patient was seen and examined on 02/23/2024   Brief hospital course: Chase Combs was admitted to the hospital with the working diagnosis of ileitis/ ischemic colitis.    78 year old with history of ischemic cardiomyopathy/LAD and heart failure  admitted to the hospital for nausea and vomiting.  Recent hospitalization 08/15 tp 01/19/24 for heart failure, he was diagnosed with severe proximal LAD stenosis which was stented with drug eluding stent. Discharged on dual antiplatelet therapy, goal directed medical therapy for heart failure and 20 mg furosemide  for diuresis.  At home patient had progressive nausea and vomiting, not able to maintain oral medications including antiplatelet therapy.  [name] he was hypotensive with systolic blood pressure 94/51, HR 50, RR 23 and 02 saturation 94% temp 103.5  Lungs with increased work of breathing with no wheezing, heart with S1 and S2 present and tachycardic, abdomen with diffuse tenderness to palpation in the lower quadrants more right than left, no lower extremity edema.    Na 132, K 4.5 Cl 81 bicarbonate 31, glucose 197 bun 64 cr 2,55 AST 29 ALT 31  BNP 220  High sensitive troponin 126 and 108  Lactic acid 3,4  Wbc 2.4 hgb 13.9 plt 489  Urine analysis SG 1,020, protein negative, negative leukocytes and negative hgb  C diff negative    Chest radiograph with hypoinflation, positive cardiomegaly, bilateral basal atelectasis and small bilateral pleural effusions.    EKG 120 bpm, left axis deviation, qtc 501, sinus rhythm with multiple PVC (right bundle branch morphology), with no significant ST segment or T wave changes, (noisy baseline)    CT chest abdomen and pelvis. Bilateral faint ground glass opacities,  Gas along the wall of the ascending colon and cecum, suspicious for pneumoatosis.  Fluid filled colon suggests a  diarrheal state. Diffuse colonic and small bowel distention likely represent ileus.  Increased small volume pelvic and new right sided pericolonic trace fluid, likely secondary to colonic process.  Right nephrolithiasis Suspicion for minimal calcific pancreatitis.    General surgery recommended conservative management.   Concern patient not able to absorb antiplatelet therapy, he was placed on IV cangrelor .  Patient continue to be hypotensive despite IV fluids and was placed on norepinephrine  infusion, consistent with septic shock due to abdominal source/ ischemic colitis.    During hospitalization required PRBC transfusion due to blood loss anemia.   There was concerns of in-stent thrombosis from recent PCI therefore antiplatelets were switched from Cangrelor  to Plavix  and started on milrinone  drip due to cardiogenic shock.   Developed atrial fibrillation with rapid ventricular response, placed on IV amiodarone .  Transition to Aggrastat for anticoagulation.  09/05 GI consulted, clinical picture consistent with colonic ileus, Ogilvie syndrome.  09/08 PRBC transfusion x1, transitioned to clopidogrel .  09/09 chest pain with new antero lateral ST elevations. Underwent urgent cardiac catheterization, LAD with ostially occluded stent with acute stent thrombosis (Culprit vessel). Successful percutaneous coronary intervention ostial proximal LAD, PTCA with balloon dilatation.  Significant volume overloaded with low SV02 and CVP very high, placed on IV furosemide  and milrinone .  09/10 completed antibiotic course.  09/10 EGD with esophageal ulcer with no bleeding and no stigmata of recent bleeding.  LA grade C reflux esophagitis with no bleeding.  Duodenal erosions without bleeding.  Flexible sigmoidoscopy positive blood in the rectum, in the sigmoid colon, in the descending colon and in the transverse  colon. No obvious source of bleed.  Diverticulosis in the sigmoid colon and in the descending colon.  No active bleeding.  A single superficial ulcer in the rectum. No active bleeding.  09/12 transition to ticagrelor .  Slowly weaned off TPN, diet as tolerated.   09/19 continue colonic pseudo obstruction, imaging with progression of colonic distention. Started on rifaximin .  Pending cardioversion until bowel function improves.  09/21 pending direct current cardioversion.  09/22 CT abdomen and pelvis dilated loops of small bowel and colon with air fluid levels in the distal colon and rectum. No obstruction. Appearance consistent with ileus or functional bowel disturbance.  09/23 trial of neostigmine .  02-18-2024 persistent colonic pseudo obstruction despite several days of SQ neostigmine  and 1 dose of IV neostigmine . All without improvement in his symptoms. Abd XR continue to show dilated colon.  Pt taken for decompression colonoscopy with colonic decompression tube placed 02-20-2024 significant improvement in abd distension. Pt now having multiple liquid BM.  Pt remains on IV amiodarone  and eliquis  for rapid afib. 02-21-2024 pt successfully cardioverted back to NSR. Pt loaded with 300 mg plavix  after his plavix  metabolism test showed he is a high metabolizer of plavix .  Assessment and Plan: * Colonic pseudoobstruction Intestinal ischemia Intestinal pneumatosis/ colonic adynamic ileus  Pseudo obstruction.  Complicated with sepsis, present on admission.  Completed antibiotic therapy on 09/10 TPN has been weaned off.    Trial of rifaximin  550 mg po tid.  Continue supportive care. Tolerating po well.  Follow up imaging with persistent small bowel and colonic dilatation, trial of neostigmine  was attempted.  Failed trial of neostigmine .  09/26 colonoscopy with segmental moderate inflammation in the proximal transverse colin secondary to ischemic colitis.  Dilatation in the ascending colon in the transverse colon.  Mild diverticulosis in the sigmoid colon and in the ascending colon.   Decompression tube placed successfully.   Continue to improve abdominal distention, now patient having more formed stools.   Acute on chronic systolic CHF (congestive heart failure) (HCC) Echocardiogram with reduced LV systolic function EF 20 to 25%, global hypokinesis, moderate LVH, diastolic dysfunction with EA fusion,  RV systolic function moderately reduced, mild RV enlargement, RVSP 30.7 mmHg, moderate mitral valve regurgitation, mild to moderate TR,   Resolved cardiogenic shock.    Urine output 1,100 ml.   Systolic blood pressure 100 mmHg range Sv02 59   Off milrinone   Medical therapy with digoxin  and spironolactone . Continue to hold on loop diuretic for now.   Coronary artery disease involving native coronary artery of native heart without angina pectoris Recent STEMI with stent thrombosis.  Initially placed on ticagrelor , holding asa due to GI bleeding.  Continue statin.   Noted patient is a rapid metabolism for clopidogrel .  Currently transitioned to clopidogrel  75 mg daily after a bolus of 300 mg daily.    Atrial fibrillation with RVR (HCC) Atrial flutter.  09/30 successfully direct current cardioversion, currently on sinu rhythm.  Plan to continue rate and rhythm control with amiodarone  and digoxin .  Keep K at 4 and Mg at 2.   Essential hypertension Continue blood pressure monitoring Continue with spironolactone   AKI (acute kidney injury) Hyponatremia, hypokalemia.   Patient currently euvolemic, renal function has been stable with serum cr at 1.47 with K at 4,8 and serum bicarbonate at 19  Na 132 and Mg 2.1   Plan to continue to hold on loop diuretic and follow up renal function in am Continue with spironolactone .   Anemia due to acute blood loss  GI bleed.    SP 4 units PRBC transfusion.    EGD with flexible sigmoidoscopy with red blood in the colon.  Grade 1 esophagitis.  Continue pantoprazole .  Follow up hgb is 12.2  Type 2 diabetes mellitus with  hyperlipidemia (HCC) Glucose has been stable, will hold on insulin  therapy and will check capillary glucose as needed.   Continue statin therapy.   Malnutrition of moderate degree Continue nutritional supplements.   Pressure injury of skin Wound 02/01/24 1600 Pressure Injury Buttocks Stage 2 -  Partial thickness loss of dermis presenting as a shallow open injury with a red, pink wound bed without slough. (Active)        Subjective: Patient with no chest pain or dyspnea, tolerating po, abdominal distention has significantly improved, no nausea or vomiting   Physical Exam: Vitals:   02/22/24 2319 02/23/24 0314 02/23/24 0500 02/23/24 0757  BP: 105/68 120/73  100/75  Pulse: 74 79 75 61  Resp: 20 20  19   Temp: (!) 97.5 F (36.4 C) 97.8 F (36.6 C)  98.9 F (37.2 C)  TempSrc: Oral Oral  Oral  SpO2: 90% 95% 93% 94%  Weight:   75.8 kg   Height:       Neurology awake and alert ENT with mild pallor  Cardiovascular with S1 and S2 present and regular with no gallops or rubs, systolic murmur at the left lower sternal border No JVD Respiratory with no rales or wheezing, no rhonchi  Abdomen with no distention, soft and non tender No lower extremity edema  Data Reviewed:    Family Communication: no family at the bedside   Disposition: Status is: Inpatient Remains inpatient appropriate because: recovering ileus   Planned Discharge Destination: Rehab    Author: Elidia Toribio Furnace, MD 02/23/2024 9:00 AM  For on call review [email or web address].

## 2024-02-23 NOTE — Assessment & Plan Note (Signed)
Continue nutritional supplements. °

## 2024-02-23 NOTE — Assessment & Plan Note (Signed)
 Atrial flutter.  09/30 successfully direct current cardioversion, currently on sinus rhythm.  Plan to continue rate and rhythm control with amiodarone  and digoxin .  Keep K at 4 and Mg at 2.

## 2024-02-24 DIAGNOSIS — I2510 Atherosclerotic heart disease of native coronary artery without angina pectoris: Secondary | ICD-10-CM | POA: Diagnosis not present

## 2024-02-24 DIAGNOSIS — K5981 Ogilvie syndrome: Secondary | ICD-10-CM | POA: Diagnosis not present

## 2024-02-24 DIAGNOSIS — I5023 Acute on chronic systolic (congestive) heart failure: Secondary | ICD-10-CM | POA: Diagnosis not present

## 2024-02-24 DIAGNOSIS — N179 Acute kidney failure, unspecified: Secondary | ICD-10-CM | POA: Diagnosis not present

## 2024-02-24 DIAGNOSIS — E440 Moderate protein-calorie malnutrition: Secondary | ICD-10-CM | POA: Diagnosis not present

## 2024-02-24 DIAGNOSIS — I4891 Unspecified atrial fibrillation: Secondary | ICD-10-CM | POA: Diagnosis not present

## 2024-02-24 LAB — COMPREHENSIVE METABOLIC PANEL WITH GFR
ALT: 20 U/L (ref 0–44)
AST: 20 U/L (ref 15–41)
Albumin: 2.6 g/dL — ABNORMAL LOW (ref 3.5–5.0)
Alkaline Phosphatase: 66 U/L (ref 38–126)
Anion gap: 7 (ref 5–15)
BUN: 21 mg/dL (ref 8–23)
CO2: 20 mmol/L — ABNORMAL LOW (ref 22–32)
Calcium: 8.5 mg/dL — ABNORMAL LOW (ref 8.9–10.3)
Chloride: 105 mmol/L (ref 98–111)
Creatinine, Ser: 1.34 mg/dL — ABNORMAL HIGH (ref 0.61–1.24)
GFR, Estimated: 55 mL/min — ABNORMAL LOW (ref >60)
Glucose, Bld: 127 mg/dL — ABNORMAL HIGH (ref 70–99)
Potassium: 4.4 mmol/L (ref 3.5–5.1)
Sodium: 132 mmol/L — ABNORMAL LOW (ref 135–145)
Total Bilirubin: 0.5 mg/dL (ref 0.0–1.2)
Total Protein: 5.6 g/dL — ABNORMAL LOW (ref 6.5–8.1)

## 2024-02-24 LAB — CBC
HCT: 36 % — ABNORMAL LOW (ref 39.0–52.0)
Hemoglobin: 11.5 g/dL — ABNORMAL LOW (ref 13.0–17.0)
MCH: 27.7 pg (ref 26.0–34.0)
MCHC: 31.9 g/dL (ref 30.0–36.0)
MCV: 86.7 fL (ref 80.0–100.0)
Platelets: 443 K/uL — ABNORMAL HIGH (ref 150–400)
RBC: 4.15 MIL/uL — ABNORMAL LOW (ref 4.22–5.81)
RDW: 18.6 % — ABNORMAL HIGH (ref 11.5–15.5)
WBC: 7.9 K/uL (ref 4.0–10.5)
nRBC: 0 % (ref 0.0–0.2)

## 2024-02-24 LAB — MAGNESIUM: Magnesium: 2 mg/dL (ref 1.7–2.4)

## 2024-02-24 LAB — COOXEMETRY PANEL
Carboxyhemoglobin: 2.3 % — ABNORMAL HIGH (ref 0.5–1.5)
Methemoglobin: <0.7 % (ref 0.0–1.5)
O2 Saturation: 67.8 %
Total hemoglobin: 12.2 g/dL (ref 12.0–16.0)

## 2024-02-24 MED ORDER — PANTOPRAZOLE SODIUM 40 MG PO TBEC
40.0000 mg | DELAYED_RELEASE_TABLET | Freq: Every day | ORAL | 0 refills | Status: DC
  Filled 2024-02-24: qty 30, 30d supply, fill #0

## 2024-02-24 MED ORDER — SPIRONOLACTONE 25 MG PO TABS
25.0000 mg | ORAL_TABLET | Freq: Every day | ORAL | 0 refills | Status: DC
  Filled 2024-02-24: qty 30, 30d supply, fill #0

## 2024-02-24 MED ORDER — DIGOXIN 125 MCG PO TABS
0.0625 mg | ORAL_TABLET | Freq: Every day | ORAL | 0 refills | Status: DC
  Filled 2024-02-24: qty 15, 30d supply, fill #0

## 2024-02-24 MED ORDER — APIXABAN 5 MG PO TABS
5.0000 mg | ORAL_TABLET | Freq: Two times a day (BID) | ORAL | 0 refills | Status: DC
  Filled 2024-02-24: qty 60, 30d supply, fill #0

## 2024-02-24 MED ORDER — FUROSEMIDE 40 MG PO TABS
40.0000 mg | ORAL_TABLET | Freq: Every day | ORAL | 11 refills | Status: DC
Start: 2024-02-24 — End: 2024-03-09
  Filled 2024-02-24: qty 30, 30d supply, fill #0

## 2024-02-24 MED ORDER — CLOPIDOGREL BISULFATE 75 MG PO TABS
75.0000 mg | ORAL_TABLET | Freq: Every day | ORAL | 0 refills | Status: DC
  Filled 2024-02-24: qty 30, 30d supply, fill #0

## 2024-02-24 MED ORDER — AMIODARONE HCL 200 MG PO TABS
200.0000 mg | ORAL_TABLET | Freq: Two times a day (BID) | ORAL | 0 refills | Status: DC
  Filled 2024-02-24: qty 60, 30d supply, fill #0

## 2024-02-24 NOTE — Progress Notes (Signed)
 Pt up in chair at bedside.   States wants to stay in chair til after supper then have the PICC removed.  Christina RN aware and agrees to plan. Christina to message once pt in the bed.  Pt Wife at bedside also aware.

## 2024-02-24 NOTE — Discharge Summary (Signed)
 Physician Discharge Summary   Patient: Chase Combs MRN: [id number] DOB: 76-10-1945  Admit date:     01/22/2024  Discharge date: 02/25/24  Discharge Physician: Elidia Sieving Tyke Outman   PCP: Wonda Worth SQUIBB, PA   Recommendations at discharge:    Heart failure regimen with spironolactone  and digoxin , limited therapy due to risk of hypotension  Patient will continue diuresis with furosemide  40 mg po daily  Amiodarone  200 mg bid until follow up as outpatient  Follow up renal function and electrolytes in 7 days  Follow up with Worth Wonda PA  Follow up with Cardiology as outpatient   Discharge Diagnoses: Principal Problem:   Colonic pseudoobstruction Active Problems:   Acute on chronic systolic CHF (congestive heart failure) (HCC)   Coronary artery disease involving native coronary artery of native heart without angina pectoris   Atrial fibrillation with RVR (HCC)   Essential hypertension   AKI (acute kidney injury)   Anemia due to acute blood loss   Malnutrition of moderate degree   Type 2 diabetes mellitus with hyperlipidemia (HCC)   Pressure injury of skin   DNR (do not resuscitate)  Resolved Problems:   * No resolved hospital problems. [facility] Course: Chase Combs was admitted to the hospital with the working diagnosis of ileitis/ ischemic colitis.  Prolonged hospitalization complicated with NSTEMI and heart failure.    78 year old with history of ischemic cardiomyopathy/LAD and heart failure  admitted to the hospital for nausea and vomiting.  Recent hospitalization 08/15 tp 01/19/24 for heart failure, he was diagnosed with severe proximal LAD stenosis which was stented with drug eluding stent. Discharged on dual antiplatelet therapy, goal directed medical therapy for heart failure and 20 mg furosemide  for diuresis.  At home patient had progressive nausea and vomiting, not able to maintain oral medications including antiplatelet therapy.  On his initial physical  examination he was hypotensive with systolic blood pressure 94/51, HR 50, RR 23 and 02 saturation 94% temp 103.5  Lungs with increased work of breathing with no wheezing, heart with S1 and S2 present and tachycardic, abdomen with diffuse tenderness to palpation in the lower quadrants more right than left, no lower extremity edema.    Na 132, K 4.5 Cl 81 bicarbonate 31, glucose 197 bun 64 cr 2,55 AST 29 ALT 31  BNP 220  High sensitive troponin 126 and 108  Lactic acid 3,4  Wbc 2.4 hgb 13.9 plt 489  Urine analysis SG 1,020, protein negative, negative leukocytes and negative hgb  C diff negative    Chest radiograph with hypoinflation, positive cardiomegaly, bilateral basal atelectasis and small bilateral pleural effusions.    EKG 120 bpm, left axis deviation, qtc 501, sinus rhythm with multiple PVC (right bundle branch morphology), with no significant ST segment or T wave changes, (noisy baseline)    CT chest abdomen and pelvis. Bilateral faint ground glass opacities,  Gas along the wall of the ascending colon and cecum, suspicious for pneumoatosis.  Fluid filled colon suggests a diarrheal state. Diffuse colonic and small bowel distention likely represent ileus.  Increased small volume pelvic and new right sided pericolonic trace fluid, likely secondary to colonic process.  Right nephrolithiasis Suspicion for minimal calcific pancreatitis.    General surgery recommended conservative management.   Concern patient not able to absorb antiplatelet therapy, he was placed on IV cangrelor .  Patient continue to be hypotensive despite IV fluids and was placed on norepinephrine  infusion, consistent with septic shock due to abdominal source/ ischemic colitis.  During hospitalization required PRBC transfusion due to blood loss anemia.   There was concerns of in-stent thrombosis from recent PCI therefore antiplatelets were switched from Cangrelor  to Plavix  and started on milrinone  drip due to  cardiogenic shock.   Developed atrial fibrillation with rapid ventricular response, placed on IV amiodarone .  Transition to Aggrastat for anticoagulation.  09/05 GI consulted, clinical picture consistent with colonic ileus, Ogilvie syndrome.  09/08 PRBC transfusion x1, transitioned to clopidogrel .  09/09 chest pain with new antero lateral ST elevations. Underwent urgent cardiac catheterization, LAD with ostially occluded stent with acute stent thrombosis (Culprit vessel). Successful percutaneous coronary intervention ostial proximal LAD, PTCA with balloon dilatation.  Significant volume overloaded with low SV02 and CVP very high, placed on IV furosemide  and milrinone .  09/10 completed antibiotic course.  09/10 EGD with esophageal ulcer with no bleeding and no stigmata of recent bleeding.  LA grade C reflux esophagitis with no bleeding.  Duodenal erosions without bleeding.  Flexible sigmoidoscopy positive blood in the rectum, in the sigmoid colon, in the descending colon and in the transverse colon. No obvious source of bleed.  Diverticulosis in the sigmoid colon and in the descending colon. No active bleeding.  A single superficial ulcer in the rectum. No active bleeding.  09/12 transition to ticagrelor .  Slowly weaned off TPN, diet as tolerated.   09/19 continue colonic pseudo obstruction, imaging with progression of colonic distention. Started on rifaximin .  Pending cardioversion until bowel function improves.  09/21 pending direct current cardioversion.  09/22 CT abdomen and pelvis dilated loops of small bowel and colon with air fluid levels in the distal colon and rectum. No obstruction. Appearance consistent with ileus or functional bowel disturbance.  09/23 trial of neostigmine .  02-18-2024 persistent colonic pseudo obstruction despite several days of SQ neostigmine  and 1 dose of IV neostigmine . All without improvement in his symptoms. Abd XR continue to show dilated colon.  Pt taken for  decompression colonoscopy with colonic decompression tube placed 02-20-2024 significant improvement in abd distension. Pt now having multiple liquid BM.  Pt remains on IV amiodarone  and eliquis  for rapid afib. 02-21-2024 pt successfully cardioverted back to NSR. Pt loaded with 300 mg plavix  after his plavix  metabolism test showed he is a high metabolizer of plavix . 10/01 transitioned to po amiodarone , continue sinus rhythm.  10/02 removed rectal tube, possible transfer to CIR   Assessment and Plan: * Colonic pseudoobstruction Intestinal ischemia Intestinal pneumatosis/ colonic adynamic ileus  Pseudo obstruction.  Complicated with sepsis, present on admission.  Completed antibiotic therapy on 09/10 TPN has been weaned off.    Patient had a trial of rifaximin  550 mg po tid.  Failed trial of neostigmine .  09/26 colonoscopy with segmental moderate inflammation in the proximal transverse colin secondary to ischemic colitis.  Dilatation in the ascending colon in the transverse colon.  Mild diverticulosis in the sigmoid colon and in the ascending colon.  Decompression tube placed successfully.   Currently having formed stools, significant improvement in abdominal distention. No nausea or vomiting.  Rectal tube will be removed.   Acute on chronic systolic CHF (congestive heart failure) (HCC) Echocardiogram with reduced LV systolic function EF 20 to 25%, global hypokinesis, moderate LVH, diastolic dysfunction with EA fusion,  RV systolic function moderately reduced, mild RV enlargement, RVSP 30.7 mmHg, moderate mitral valve regurgitation, mild to moderate TR,   Resolved cardiogenic shock.    Patient was placed on IV furosemide  and milrinone .  Negative fluid balance was achieved, with at least 5 Kg  weight loss with good toleration. Drips were weaned off and he was transition to medical therapy.   Currently on digoxin  and spironolactone . Resume torsemide  at discharge 20 mg   Coronary  artery disease involving native coronary artery of native heart without angina pectoris Recent STEMI with stent thrombosis.  Initially placed on ticagrelor , holding asa due to GI bleeding.  Continue statin.   Noted patient is a rapid metabolism for clopidogrel .  Currently transitioned to clopidogrel  75 mg daily after a bolus of 300 mg daily.  Currently patient will continue clopidogrel  and DOAC with apixaban .   Atrial fibrillation with RVR (HCC) Atrial flutter.  09/30 successfully direct current cardioversion, currently on sinus rhythm.  Plan to continue rate and rhythm control with amiodarone  and digoxin .  Keep K at 4 and Mg at 2.   Essential hypertension Continue blood pressure monitoring Continue with spironolactone   AKI (acute kidney injury) Hyponatremia, hypokalemia.   Patient currently euvolemic. Today renal function with serum cr at 1,34 with K at 4.4 and serum bicarbonate at 20  Na 132    Continue spironolactone  and will resume torsemide  at discharge.  Follow up renal function and electrolytes in 7 days as outpatient.   Anemia due to acute blood loss GI bleed.    SP 4 units PRBC transfusion.    EGD with flexible sigmoidoscopy with red blood in the colon.  Grade 1 esophagitis.  Continue pantoprazole .  Follow up hgb is 11.5  Type 2 diabetes mellitus with hyperlipidemia (HCC) Glucose has been stable Insulin  therapy was discontinued.  Fasting glucose on day of discharge is 127 mg/dl   Continue statin therapy.   Malnutrition of moderate degree Continue nutritional supplements.   Pressure injury of skin Wound 02/01/24 1600 Pressure Injury Buttocks Stage 2 -  Partial thickness loss of dermis presenting as a shallow open injury with a red, pink wound bed without slough. (Active)        Consultants: cardiology, GI  Procedures performed: cardiac catheterization, colonoscopy   Disposition: Rehabilitation facility Diet recommendation:  Cardiac and Carb modified  diet DISCHARGE MEDICATION: Allergies as of 02/25/2024       Reactions   Codeine Nausea And Vomiting   Latex Rash   Oral blisters when used at dentist        Medication List     STOP taking these medications    aspirin  EC 81 MG tablet   Entresto  49-51 MG Generic drug: sacubitril -valsartan    metoprolol  succinate 25 MG 24 hr tablet Commonly known as: TOPROL -XL   nitroGLYCERIN  0.4 MG SL tablet Commonly known as: Nitrostat    ondansetron  4 MG disintegrating tablet Commonly known as: ZOFRAN -ODT   ticagrelor  90 MG Tabs tablet Commonly known as: BRILINTA        TAKE these medications    amiodarone  200 MG tablet Commonly known as: PACERONE  Take 1 tablet (200 mg total) by mouth 2 (two) times daily. What changed: when to take this   CALCIUM  PO Take 1 tablet by mouth daily.   clopidogrel  75 MG tablet Commonly known as: PLAVIX  Take 1 tablet (75 mg total) by mouth daily.   clotrimazole-betamethasone cream Commonly known as: LOTRISONE Apply 1 Application topically 2 (two) times daily as needed (skin irritation).   digoxin  0.125 MG tablet Commonly known as: LANOXIN  Take 0.5 tablets (0.0625 mg total) by mouth daily.   Eliquis  5 MG Tabs tablet Generic drug: apixaban  Take 1 tablet (5 mg total) by mouth 2 (two) times daily.   furosemide  40 MG tablet Commonly  known as: Lasix  Take 1 tablet (40 mg total) by mouth daily. What changed:  medication strength how much to take   Multivitamin Men 50+ Tabs Take 1 tablet by mouth daily.   pantoprazole  40 MG tablet Commonly known as: PROTONIX  Take 1 tablet (40 mg total) by mouth daily.   PROBIOTIC PO Take 1 capsule by mouth daily.   rosuvastatin  20 MG tablet Commonly known as: CRESTOR  TAKE 1 TABLET(20 MG) BY MOUTH DAILY   spironolactone  25 MG tablet Commonly known as: ALDACTONE  Take 1 tablet (25 mg total) by mouth daily. What changed: how much to take   tamsulosin  0.4 MG Caps capsule Commonly known as:  Flomax  Take 1 capsule (0.4 mg total) by mouth daily after supper.   VITAMIN B-12 PO Take 1 tablet by mouth daily.        Follow-up Information     Turmel, Caleb P, PA Follow up.   Specialty: Physician Assistant Why: Please call to arrange hospital follow up appt in 7-14 days Contact information: [address] [place] [address] [phone]                Discharge Exam: Filed Weights   02/21/24 0532 02/22/24 0352 02/23/24 0500  Weight: 77.9 kg 76.8 kg 75.8 kg   BP 115/71 (BP Location: Right Arm)   Pulse 67   Temp 97.7 F (36.5 C) (Axillary) Comment (Src): pt was eating ice cream when i went in to do vitals  Resp 16   Ht 6' (1.829 m)   Wt 75.8 kg   SpO2 96%   BMI 22.66 kg/m   Patient is feeling better, he is having formed stools, no nausea or vomiting, no chest pain.   Neurology awake and alert ENT with mild pallor with no icterus Cardiovascular with S1 and S2 present and regular, with no gallops or rubs, positive systolic murmur at the apex Respiratory with no rales or wheezing, no rhonchi  Abdomen soft, not distended and not tender No lower extremity edema.   Condition at discharge: stable  The results of significant diagnostics from this hospitalization (including imaging, microbiology, ancillary and laboratory) are listed below for reference.   Imaging Studies: EP STUDY Result Date: 02/21/2024 See surgical note for result.  DG Abd 1 View Result Date: 02/20/2024 EXAM: 1 VIEW XRAY OF THE ABDOMEN 02/20/2024 09:46:00 AM COMPARISON: 02/19/2024 CLINICAL HISTORY: pt order states ileus. Pt order comments state Do portable, assess ileus, position of decompression tube FINDINGS: LINES, TUBES AND DEVICES: Percutaneous enteric tube in place with tip in the proximal transverse colon. Rectal tube in place. BOWEL: Nonobstructive bowel gas pattern. Decreased right colon distention. SOFT TISSUES: No opaque urinary calculi. BONES: No acute osseous  abnormality. IMPRESSION: 1. Decreased right colon distention, consistent with improving ileus. Electronically signed by: Lonni Necessary MD 02/20/2024 10:32 AM EDT RP Workstation: [id number]   DG Abd 2 Views Result Date: 02/19/2024 EXAM: 2 VIEW XRAY OF THE ABDOMEN 02/19/2024 07:35:00 AM COMPARISON: 02/18/2024 CLINICAL HISTORY: adynamic ileus FINDINGS: LINES, TUBES AND DEVICES: Catheter introduced through the rectum extends to the right upper quadrant in the region of the hepatic flexure. BOWEL: Decreased distal colonic distention from prior exam. Gaseous distention at the hepatic flexure persists but has improved, maximum dimension 9 cm, previously 12.4 cm. Similar gaseous small bowel distention centrally. There is no evidence of free air. SOFT TISSUES: No opaque urinary calculi. BONES: No acute osseous abnormality. IMPRESSION: 1. Improved colonic ileus. Persistent but improved gaseous distention at the  hepatic flexure. Similar gaseous small bowel distention centrally 2. No evidence of free air. Electronically signed by: Andrea Gasman MD 02/19/2024 11:16 AM EDT RP Workstation: [id number]   DG Abd 2 Views Result Date: 02/18/2024 CLINICAL DATA:  Adynamic ileus EXAM: DG ABDOMEN 2V COMPARISON:  Abdominal radiograph dated 02/17/2024 FINDINGS: Slightly increased gaseous dilation of the colon. Decreased gas-filled dilation of the small bowel. No free air or pneumatosis. No abnormal radio-opaque calculi or mass effect. No acute or substantial osseous abnormality. The sacrum and coccyx are partially obscured by overlying bowel contents. Prostate fiducials project over the lower midline pelvis. IMPRESSION: Slightly increased gaseous dilation of the colon. Decreased gas-filled dilation of the small bowel. Electronically Signed   By: Limin  Xu M.D.   On: 02/18/2024 14:58   DG C-Arm 1-60 Min-No Report Result Date: 02/18/2024 Fluoroscopy was utilized by the requesting physician.  No radiographic interpretation.    DG Abd 2 Views Result Date: 02/17/2024 EXAM: 2 VIEW XRAY OF THE ABDOMEN 02/17/2024 10:53:00 AM COMPARISON: None available. CLINICAL HISTORY: Ileus, unspecified. Patient has pain in abdomen. FINDINGS: BOWEL: Persistent dilatation of the small and large bowel loops with multiple air-fluid levels and transition point in the left lower quadrant of the abdomen at the level of the mid sigmoid colon. SOFT TISSUES: No opaque urinary calculi. BONES: No acute osseous abnormality. IMPRESSION: 1. No significant change in the appearance of diffuse colonic and small bowel dilatation compatible with ileus or functional bowel obstruction Electronically signed by: Waddell Calk MD 02/17/2024 12:01 PM EDT RP Workstation: [id number]   CT ABDOMEN PELVIS WO CONTRAST Result Date: 02/14/2024 CLINICAL DATA:  Abdominal distension, colonic ileus EXAM: CT ABDOMEN AND PELVIS WITHOUT CONTRAST TECHNIQUE: Multidetector CT imaging of the abdomen and pelvis was performed following the standard protocol without IV contrast. RADIATION DOSE REDUCTION: This exam was performed according to the departmental dose-optimization program which includes automated exposure control, adjustment of the mA and/or kV according to patient size and/or use of iterative reconstruction technique. COMPARISON:  Radiographs 02/10/2024 and CT scan 01/30/2024 FINDINGS: Lower chest: Right middle lobe and right lower lobe atelectasis along the right hemidiaphragm. Mild lingular and left lower lobe atelectasis along the left hemidiaphragm. Prominent cardiomegaly. Coronary and aortic atherosclerosis. Possible central venous catheter tip the cavoatrial junction. Hepatobiliary: High density in the gallbladder probably from sludge. Otherwise unremarkable. Pancreas: Unremarkable Spleen: Unremarkable Adrenals/Urinary Tract: Stable renal cysts. 0.3 cm right kidney lower pole calcification suspicious for nonobstructive stone. No hydronephrosis or hydroureter. Right adrenal  nodularity without overt mass, stable. Stomach/Bowel: Moderately distended stomach with air-fluid level. Scattered dilated loops of small bowel, index lesion in the pelvis 4.1 cm diameter. Dilated colon with air-fluid levels. Air-fluid levels noted in the distal colon and rectum indicating diarrheal process. Sigmoid colon diverticulosis without active diverticulitis. No lead point for obstruction is identified. No obvious twisted bowel or volvulus. No pneumatosis observed. Vascular/Lymphatic: Atherosclerosis is present, including aortoiliac atherosclerotic disease. Atheromatous plaque noted proximally in the superior mesenteric artery. Vascular patency not assessed on today's noncontrast CT examination although the SMA was patent on a CT angiogram of 01/30/2024. Narrowing of the proximal celiac trunk possibly related to median arcuate ligament. Reproductive: Fiducials along the mildly enlarged prostate gland. Prominent median lobe indents the bladder base. Other: No supplemental non-categorized findings. Musculoskeletal: Grade 1 degenerative anterolisthesis L4-5. Multilevel lumbar degenerative facet arthropathy with foraminal impingement bilaterally at L3-4 and on the right at L1-2, L2-3, and L4-5 as well. Umbilical hernia contains adipose tissue. 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 IMPRESSION: 1. Dilated loops of small bowel and colon with air-fluid levels in the distal colon and rectum indicating diarrheal process. No lead point for obstruction is identified. No pneumatosis or free air. Presumably the appearance is due to ileus or functional bowel disturbance. 2. 0.3 cm right kidney lower pole nonobstructive stone. 3. Multilevel lumbar degenerative facet arthropathy with multilevel foraminal impingement. 4. Umbilical hernia contains adipose tissue. 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 5. High density in the gallbladder probably from sludge. 6. Mild bibasilar atelectasis.  7. Aortic Atherosclerosis (ICD10-I70.0). Coronary atherosclerosis. Prominent cardiomegaly. Electronically Signed   By: Ryan Salvage M.D.   On: 02/14/2024 16:42   DG Abd 1 View Result Date: 02/10/2024 CLINICAL DATA:  Abdominal distension. EXAM: ABDOMEN - 1 VIEW COMPARISON:  February 07, 2024. FINDINGS: Stable colonic distention is noted. Transverse colon measures 9.3 cm in diameter. No small bowel dilatation is noted. IMPRESSION: Stable colonic distention is noted. Electronically Signed   By: Lynwood Landy Raddle M.D.   On: 02/10/2024 10:49   DG CHEST PORT 1 VIEW Result Date: 02/08/2024 CLINICAL DATA:  Status post PICC placement. EXAM: PORTABLE CHEST 1 VIEW COMPARISON:  Earlier today, 1-1/2 hours prior. FINDINGS: Left upper extremity PICC in place. The previous distal coil is different in position, likely facing anteriorly or posteriorly, but still appears angulated. Tip is in the region of the upper SVC. Unchanged bibasilar atelectasis. Stable heart size and mediastinal contours. No pneumothorax. IMPRESSION: Left upper extremity PICC in place. The previous distal coil is different in position, likely facing anteriorly or posteriorly, but still appears angulated. Tip is in the region of the upper SVC. Electronically Signed   By: Andrea Gasman M.D.   On: 02/08/2024 16:43   DG CHEST PORT 1 VIEW Result Date: 02/08/2024 CLINICAL DATA:  PICC line placement EXAM: PORTABLE CHEST 1 VIEW COMPARISON:  01/24/2024 FINDINGS: Left PICC line in place. The tip coils, possibly entering the azygous vein. Bibasilar atelectasis. No effusions. Heart mediastinal contours within normal limits. IMPRESSION: PICC line tip coils in the region of the SVC, possibly in the azygous vein. Bibasilar atelectasis. Electronically Signed   By: Franky Crease M.D.   On: 02/08/2024 13:03   DG Abd 1 View Result Date: 02/07/2024 CLINICAL DATA:  Abdominal distention EXAM: ABDOMEN - 1 VIEW COMPARISON:  February 04, 2024 FINDINGS: Gaseous  distention of the large bowel loops, stable to prior exam suggestive of ileus/obstruction. No radio-opaque calculi or other significant radiographic abnormality are seen. No pneumoperitoneum. Multilevel degenerative changes of the spine. IMPRESSION: Gaseous distention of the colon, stable to prior. Electronically Signed   By: Megan  Zare M.D.   On: 02/07/2024 11:33   DG Abd 1 View Result Date: 02/04/2024 EXAM: 1 VIEW XRAY OF THE ABDOMEN 02/04/2024 08:17:07 PM COMPARISON: 01/28/2024 CLINICAL HISTORY: Ileus (HCC) [address]12[address]. Ileus FINDINGS: BOWEL: The gaseous distention of the colon is relatively stable which suggests an underlying colonic ileus. SOFT TISSUES: No opaque urinary calculi. BONES: No acute osseous abnormality. IMPRESSION: 1. Persistent colonic gaseous distension, relatively stable, suggestive of underlying colonic ileus. Electronically signed by: Dorethia Molt MD 02/04/2024 08:24 PM EDT RP Workstation: [id number]   ECHOCARDIOGRAM COMPLETE Result Date: 02/02/2024    ECHOCARDIOGRAM REPORT   Patient Name:   GILBERTO STANFORTH Date of Exam: 02/02/2024 Medical Rec #:  [id number]      Height:       72.0 in Accession #:    [id number]     Weight:  226.0 lb Date of Birth:  March 17, 1946      BSA:          2.244 m Patient Age:    78 years       BP:           102/69 mmHg Patient Gender: M              HR:           102 bpm. Exam Location:  Inpatient Procedure: 2D Echo, Cardiac Doppler and Color Doppler (Both Spectral and Color            Flow Doppler were utilized during procedure). Indications:    Congestive Heart Failure I50.9  History:        Patient has prior history of Echocardiogram examinations, most                 recent 01/13/2024. CAD; Risk Factors:Hypertension and Sleep                 Apnea.  Sonographer:    Jayson Gaskins Referring Phys: [phone] AMY D CLEGG IMPRESSIONS  1. Left ventricular ejection fraction, by estimation, is 20 to 25%. The left ventricle has severely decreased function. The left ventricle  demonstrates global hypokinesis. There is moderate concentric left ventricular hypertrophy. Indeterminate diastolic filling due to E-A fusion. There is abnormal (paradoxical) septal motion, consistent with right ventricular volume overload.  2. Right ventricular systolic function is moderately reduced. The right ventricular size is mildly enlarged. Tricuspid regurgitation signal is inadequate for assessing PA pressure. The estimated right ventricular systolic pressure is 30.7 mmHg.  3. The mitral valve is degenerative. Moderate mitral valve regurgitation.  4. Tricuspid valve regurgitation is mild to moderate.  5. The aortic valve is tricuspid. Aortic valve regurgitation is trivial. Aortic valve sclerosis is present, with no evidence of aortic valve stenosis.  6. The inferior vena cava is dilated in size with <50% respiratory variability, suggesting right atrial pressure of 15 mmHg. Comparison(s): Changes from prior study are noted. 01/13/2024: LVEF 25-30%, moderate RV systolic dysfunction. FINDINGS  Left Ventricle: Left ventricular ejection fraction, by estimation, is 20 to 25%. The left ventricle has severely decreased function. The left ventricle demonstrates global hypokinesis. The left ventricular internal cavity size was normal in size. There is moderate concentric left ventricular hypertrophy. Abnormal (paradoxical) septal motion, consistent with right ventricular volume overload. Indeterminate diastolic filling due to E-A fusion. Right Ventricle: The right ventricular size is mildly enlarged. No increase in right ventricular wall thickness. Right ventricular systolic function is moderately reduced. Tricuspid regurgitation signal is inadequate for assessing PA pressure. The tricuspid regurgitant velocity is 1.98 m/s, and with an assumed right atrial pressure of 15 mmHg, the estimated right ventricular systolic pressure is 30.7 mmHg. Left Atrium: Left atrial size was normal in size. Right Atrium: Right atrial  size was normal in size. Pericardium: There is no evidence of pericardial effusion. Mitral Valve: The mitral valve is degenerative in appearance. Moderate mitral valve regurgitation. Tricuspid Valve: The tricuspid valve is normal in structure. Tricuspid valve regurgitation is mild to moderate. Aortic Valve: The aortic valve is tricuspid. Aortic valve regurgitation is trivial. Aortic valve sclerosis is present, with no evidence of aortic valve stenosis. Aortic valve mean gradient measures 5.0 mmHg. Aortic valve peak gradient measures 8.0 mmHg. Aortic valve area, by VTI measures 2.17 cm. Pulmonic Valve: The pulmonic valve was normal in structure. Pulmonic valve regurgitation is trivial. Aorta: The aortic root is normal in size and  structure. Venous: The inferior vena cava is dilated in size with less than 50% respiratory variability, suggesting right atrial pressure of 15 mmHg. IAS/Shunts: No atrial level shunt detected by color flow Doppler.  LEFT VENTRICLE PLAX 2D LVIDd:         6.10 cm LVIDs:         5.70 cm LV PW:         1.20 cm LV IVS:        1.30 cm LVOT diam:     2.00 cm LV SV:         47 LV SV Index:   21 LVOT Area:     3.14 cm  RIGHT VENTRICLE RV S prime:     14.00 cm/s TAPSE (M-mode): 1.8 cm LEFT ATRIUM             Index        RIGHT ATRIUM           Index LA Vol (A2C):   93.1 ml 41.49 ml/m  RA Area:     15.90 cm LA Vol (A4C):   30.5 ml 13.59 ml/m  RA Volume:   40.70 ml  18.14 ml/m LA Biplane Vol: 56.5 ml 25.18 ml/m  AORTIC VALVE AV Area (Vmax):    2.12 cm AV Area (Vmean):   1.99 cm AV Area (VTI):     2.17 cm AV Vmax:           141.00 cm/s AV Vmean:          101.000 cm/s AV VTI:            0.216 m AV Peak Grad:      8.0 mmHg AV Mean Grad:      5.0 mmHg LVOT Vmax:         95.00 cm/s LVOT Vmean:        63.900 cm/s LVOT VTI:          0.149 m LVOT/AV VTI ratio: 0.69  AORTA Ao Root diam: 2.90 cm MITRAL VALVE               TRICUSPID VALVE MV Area (PHT): 2.93 cm    TR Peak grad:   15.7 mmHg MV Decel  Time: 259 msec    TR Vmax:        198.00 cm/s MV E velocity: 87.50 cm/s                            SHUNTS                            Systemic VTI:  0.15 m                            Systemic Diam: 2.00 cm Vinie Maxcy MD Electronically signed by Vinie Maxcy MD Signature Date/Time: 02/02/2024/3:33:36 PM    Final    CARDIAC CATHETERIZATION Result Date: 02/01/2024 Images from the original result were not included. Coronary angiography and intervention 02/01/2024: LM: Distal 45% stenosis LAD: Ostially occluded stent with acute stent thrombosis (Culprit vessel) Ramus: 50% in-stent restenosis Lcx: Mild diffuse disease RCA: Not engaged today LVEDP 27 mmHg Successful percutaneous coronary intervention ostial prox LAD        PTCA with serial balloon dilatation, up to 3.0  X 12 mm [employer] balloon up to 20 atm Manish JINNY Lawrence, MD  CT ANGIO GI BLEED Result Date: 01/30/2024 CLINICAL DATA:  Lower gastrointestinal bleeding. EXAM: CTA ABDOMEN AND PELVIS WITHOUT AND WITH CONTRAST TECHNIQUE: Multidetector CT imaging of the abdomen and pelvis was performed using the standard protocol during bolus administration of intravenous contrast. Multiplanar reconstructed images and MIPs were obtained and reviewed to evaluate the vascular anatomy. RADIATION DOSE REDUCTION: This exam was performed according to the departmental dose-optimization program which includes automated exposure control, adjustment of the mA and/or kV according to patient size and/or use of iterative reconstruction technique. CONTRAST:  OMNIPAQUE  IOHEXOL  350 MG/ML SOLN COMPARISON:  January 22, 2024. FINDINGS: VASCULAR Aorta: Atherosclerosis of abdominal aorta is noted without aneurysm or dissection. 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 No thrombus is noted. SMA: Patent without evidence of aneurysm, dissection, vasculitis or significant stenosis. Renals: Bilateral renal arteries are patent without evidence of  aneurysm, dissection, vasculitis, fibromuscular dysplasia or significant stenosis. IMA: Patent without evidence of aneurysm, dissection, vasculitis or significant stenosis. Inflow: Patent without evidence of aneurysm, dissection, vasculitis or significant stenosis. Proximal Outflow: Bilateral common femoral and visualized portions of the superficial and profunda femoral arteries are patent without evidence of aneurysm, dissection, vasculitis or significant stenosis. Veins: No obvious venous abnormality within the limitations of this arterial phase study. Review of the MIP images confirms the above findings. NON-VASCULAR Lower chest: Minimal bilateral pleural effusions are noted with adjacent subsegmental atelectasis. Hepatobiliary: No focal liver abnormality is seen. No gallstones, gallbladder wall thickening, or biliary dilatation. Pancreas: Unremarkable. No pancreatic ductal dilatation or surrounding inflammatory changes. Spleen: Normal in size without focal abnormality. Adrenals/Urinary Tract: Probable right adrenal adenoma. Left adrenal gland is unremarkable. Stable bilateral renal cystic abnormalities as noted on prior exam. No hydronephrosis or renal obstruction is noted. Urinary bladder is unremarkable. Stomach/Bowel: The stomach is unremarkable. There is no evidence of bowel obstruction. Sigmoid diverticulosis is noted. The appendix is unremarkable. Mild wall and fold thickening of ascending colon is noted suggesting possible infectious or inflammatory colitis. No definite evidence of contrast extravasation to suggest gastrointestinal bleeding. Rectal tube is noted. Lymphatic: No adenopathy is noted. 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 Other: Small fat containing periumbilical hernia.  No ascites. Musculoskeletal: No acute or significant osseous findings. 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 No  definite evidence of contrast extravasation to suggest gastrointestinal bleeding. Mild wall and fold thickening of ascending colon is noted suggesting possible infectious or inflammatory colitis. Aortic Atherosclerosis (ICD10-I70.0). Electronically Signed   By: Lynwood Landy Raddle M.D.   On: 01/30/2024 14:25   DG Abd 1 View Result Date: 01/28/2024 CLINICAL DATA:  [id number] Ileus [facility]) [id number] EXAM: ABDOMEN - 1 VIEW COMPARISON:  January 25, 2024, January 22, 2024 FINDINGS: The stomach is not evaluated.Similar diffuse gaseous distension of the entire colon with minimal rectal gas present. Generalized paucity of small bowel gas throughout the central abdomen.No pneumoperitoneum. Multilevel degenerative disc disease of the spine. IMPRESSION: Similar diffuse gaseous distension of the entire colon. Electronically Signed   By: Rogelia Myers M.D.   On: 01/28/2024 10:01   DG Abd 1 View Result Date: 01/25/2024 CLINICAL DATA:  Pneumatosis intestinalis EXAM: ABDOMEN - 1 VIEW COMPARISON:  Abdominal radiograph dated 01/24/2024. FINDINGS: Evaluation is limited due to body habitus. An enteric tube with tip over the epigastric area in the proximal stomach. Diffuse gaseous distension of the colon relatively similar to prior radiograph. No definite free air.  No acute osseous pathology. IMPRESSION: 1. Enteric tube with tip over the proximal stomach. 2. Diffuse gaseous distension of the colon. Electronically Signed   By: Vanetta Chou M.D.   On: 01/25/2024 14:37    Microbiology: Results for orders placed or performed during the hospital encounter of 01/22/24  Culture, blood (routine x 2)     Status: None   Collection Time: 01/22/24 12:43 PM   Specimen: BLOOD RIGHT ARM  Result Value Ref Range Status   Specimen Description BLOOD RIGHT ARM  Final   Special Requests   Final    BOTTLES DRAWN AEROBIC AND ANAEROBIC Blood Culture adequate volume   Culture   Final    NO GROWTH 5 DAYS Performed at [facility], [address], [place], [place] [address]    Report Status 01/27/2024 FINAL  Final  Culture, blood (routine x 2)     Status: None   Collection Time: 01/22/24 12:48 PM   Specimen: BLOOD RIGHT HAND  Result Value Ref Range Status   Specimen Description BLOOD RIGHT HAND  Final   Special Requests   Final    BOTTLES DRAWN AEROBIC ONLY Blood Culture results may not be optimal due to an inadequate volume of blood received in culture bottles   Culture   Final    NO GROWTH 5 DAYS Performed at [facility], [address], [place], [place] [address]    Report Status 01/27/2024 FINAL  Final  C Difficile Quick Screen w PCR reflex     Status: None   Collection Time: 01/22/24  1:10 PM   Specimen: STOOL  Result Value Ref Range Status   C Diff antigen NEGATIVE NEGATIVE Final   C Diff toxin NEGATIVE NEGATIVE Final   C Diff interpretation No C. difficile detected.  Final    Comment: Performed at [facility], [address], [place], [place] [address]  MRSA Next Gen by PCR, Nasal     Status: None   Collection Time: 01/22/24  1:10 PM   Specimen: Nasal Mucosa; Nasal Swab  Result Value Ref Range Status   MRSA by PCR Next Gen NOT DETECTED NOT DETECTED Final    Comment: (NOTE) The GeneXpert MRSA Assay (FDA approved for NASAL specimens only), is one component of a comprehensive MRSA colonization surveillance program. It is not intended to diagnose MRSA infection nor to guide or monitor treatment for MRSA infections. Test performance is not FDA approved in patients less than 46 years old. Performed at [facility], [address], [place], [place] [address]     Labs: CBC: Recent Labs  Lab 02/20/24 [phone] 02/21/24 0550 02/22/24 0553 02/23/24 0323 02/24/24 0348  WBC 6.5 9.0 8.7 10.0 7.9  HGB 10.7* 11.4* 11.4* 12.2* 11.5*  HCT 33.8* 35.5* 35.1* 37.5* 36.0*  MCV 88.9 88.8 87.3 86.8 86.7  PLT 567* 568* 486* 505* 443*   Basic Metabolic Panel: Recent Labs  Lab 02/19/24 0400 02/20/24 0537  02/21/24 0550 02/22/24 0553 02/23/24 0323 02/24/24 0348  NA 133* 133* 134* 134* 132* 132*  K 3.5 3.3* 4.4 3.9 4.8 4.4  CL 100 102 107 102 106 105  CO2 19* 19* 17* 19* 19* 20*  GLUCOSE 235* 197* 113* 185* 120* 127*  BUN 41* 34* 34* 25* 25* 21  CREATININE 1.59* 1.39* 1.55* 1.37* 1.45* 1.34*  CALCIUM  8.1* 8.1* 8.5* 8.3* 8.7* 8.5*  MG 2.0 2.2 2.2 2.0 2.1 2.0  PHOS 4.3  --   --   --   --   --  Liver Function Tests: Recent Labs  Lab 02/20/24 0537 02/21/24 0550 02/22/24 0553 02/23/24 0323 02/24/24 0348  AST 12* 15 18 19 20   ALT 13 14 15 19 20   ALKPHOS 64 71 66 74 66  BILITOT 0.6 0.7 0.6 0.6 0.5  PROT 5.6* 5.9* 5.6* 6.0* 5.6*  ALBUMIN 2.6* 2.8* 2.7* 2.8* 2.6*   CBG: Recent Labs  Lab 02/18/24 2122  GLUCAP 148*    Discharge time spent: greater than 30 minutes.  Signed: Elidia Toribio Furnace, MD [facility] 02/24/2024

## 2024-02-24 NOTE — Progress Notes (Signed)
 Physical Therapy Treatment Patient Details Name: Chase Combs MRN: [id number] DOB: Jul 09, 1945 Today's Date: 02/24/2024   History of Present Illness 78 year old man admitted 8/30 with progressive nausea vomiting with CT images concerning for ischemic colitis; pneumatosis intestinalis/ileus. 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 Hospitalization complicated by probable right-sided ischemic colitis and transient lower GI bleeding in setting of Brilinta /aspirin /Eliquis .     Patient subsequently developed a small and large bowel ileus/pseudo obstruction which has been refractory.  Patient s/p colonoscopic decompression 9/26 with significant improvement in his abdominal distention and discomfort. Cardioversion on 9/29. PMH: ischemic cardiomyopathy and very recent LAD stent placement for NSTEMI discharged 8/27.    PT Comments  Pt received in supine, A&O and agreeable to therapy session, spouse present and able to provide chair follow for pt safety during session. Pt performed transfers and gait with up to CGA for safety, but no physical assist needed this date for steadying. Pt able to perform household distance gait trial without needing seated break and BP taken in chair post-exertion reading SBP 118, an improvement compared with previous sessions. Pt has a ramp for home entry so does not need to practice stairs prior to DC. Pt continues to benefit from PT services to progress toward functional mobility goals, PTA discussed disposition plan with pt/spouse per recent progress, both agreeable to HHPT upon DC, discussed also with supervising PT Logan B.    If plan is discharge home, recommend the following: Assistance with cooking/housework;Assist for transportation;A little help with bathing/dressing/bathroom;Help with stairs or ramp for entrance;A little help with walking and/or transfers   Can travel by private vehicle        Equipment Recommendations  None recommended by PT    Recommendations  for Other Services       Precautions / Restrictions Precautions Precautions: Fall Recall of Precautions/Restrictions: Intact Precaution/Restrictions Comments: history of orthostatic hypotension; LUE triple lumen catheter Restrictions Weight Bearing Restrictions Per Provider Order: No     Mobility  Bed Mobility Overal bed mobility: Needs Assistance Bed Mobility: Supine to Sit     Supine to sit: Supervision, HOB elevated     General bed mobility comments: pt sleeps in recliner so PTA left HOB elevated while pt performs transfer    Transfers Overall transfer level: Needs assistance Equipment used: Rolling walker (2 wheels) Transfers: Sit to/from Stand Sit to Stand: Supervision           General transfer comment: EOB>RW and RW>chair; cues for hand placement and safety, fair eccentric control when sitting to recliner    Ambulation/Gait Ambulation/Gait assistance: Supervision, Contact guard assist, +2 safety/equipment Gait Distance (Feet): 150 Feet Assistive device: Rolling walker (2 wheels) Gait Pattern/deviations: Step-through pattern Gait velocity: reduced     General Gait Details: slowed step-through gait, no LOB, min cues for closer proximity to RW; chair follow for safety but pt did not need to sit down. SpO2/HR WFL on RA   Stairs Stairs:  (pt has a ramp)           Wheelchair Mobility     Tilt Bed    Modified Rankin (Stroke Patients Only)       Balance Overall balance assessment: Needs assistance Sitting-balance support: No upper extremity supported, Feet supported Sitting balance-Leahy Scale: Good     Standing balance support: Bilateral upper extremity supported, Reliant on assistive device for balance Standing balance-Leahy Scale: Poor Standing balance comment: RW  Communication Communication Communication: No apparent difficulties  Cognition Arousal: Alert Behavior During Therapy: WFL for tasks  assessed/performed   PT - Cognitive impairments: No apparent impairments                         Following commands: Intact      Cueing Cueing Techniques: Verbal cues  Exercises Other Exercises Other Exercises: seated BLE AROM: LAQ x10 reps ea, UE shoulder/elbow flex/ext x10 reps ea prior to standing    General Comments General comments (skin integrity, edema, etc.): mild redness toward top of compression socks, RN notified, socks rolled down 1/2 inch to not press on that spot      Pertinent Vitals/Pain Pain Assessment Pain Assessment: No/denies pain Pain Intervention(s): Monitored during session, Repositioned    Home Living                          Prior Function            PT Goals (current goals can now be found in the care plan section) Acute Rehab PT Goals Patient Stated Goal: get well, return home PT Goal Formulation: With patient Time For Goal Achievement: 02/19/24 Progress towards PT goals: Progressing toward goals    Frequency    Min 3X/week      PT Plan      Co-evaluation              AM-PAC PT 6 Clicks Mobility   Outcome Measure  Help needed turning from your back to your side while in a flat bed without using bedrails?: None Help needed moving from lying on your back to sitting on the side of a flat bed without using bedrails?: None Help needed moving to and from a bed to a chair (including a wheelchair)?: A Little Help needed standing up from a chair using your arms (e.g., wheelchair or bedside chair)?: A Little Help needed to walk in hospital room?: A Little Help needed climbing 3-5 steps with a railing? : A Lot 6 Click Score: 19    End of Session Equipment Utilized During Treatment: Gait belt Activity Tolerance: Patient tolerated treatment well Patient left: in chair;with call bell/phone within reach;with chair alarm set;with family/visitor present (spouse in room) Nurse Communication: Mobility status PT Visit  Diagnosis: Unsteadiness on feet (R26.81);Other abnormalities of gait and mobility (R26.89);Muscle weakness (generalized) (M62.81);Difficulty in walking, not elsewhere classified (R26.2)     Time: 8549-8492 PT Time Calculation (min) (ACUTE ONLY): 17 min  Charges:    $Gait Training: 8-22 mins PT General Charges $$ ACUTE PT VISIT: 1 Visit                     Ardis Fullwood P., PTA Acute Rehabilitation Services Secure Chat Preferred 9a-5:30pm Office: [phone]    [name] [facility] 02/24/2024, 3:18 PM

## 2024-02-24 NOTE — TOC Progression Note (Signed)
 Transition of Care [facility]) - Progression Note    Patient Details  Name: JABIN TAPP MRN: [id number] Date of Birth: 19-Oct-1945  [facility] CM/SW Contact  Roxie KANDICE Stain, RN Phone Number: 02/24/2024, 4:21 PM  Clinical Narrative:     Patient and wife are agreeable to use [facility] again for home health.  [name] with [facility] accepted referral.  Expected Discharge Plan: Home w Home Health Services Barriers to Discharge: Continued Medical Work up               Expected Discharge Plan and Services   Discharge Planning Services: CM Consult   Living arrangements for the past 2 months: Single Family Home                                       Social Drivers of Health (SDOH) Interventions SDOH Screenings   Food Insecurity: No Food Insecurity (02/06/2024)  Housing: Low Risk  (02/06/2024)  Transportation Needs: No Transportation Needs (02/06/2024)  Utilities: Not At Risk (02/06/2024)  Social Connections: Moderately Integrated (02/06/2024)  Tobacco Use: Low Risk  (02/02/2024)    Readmission Risk Interventions    01/26/2024   12:46 PM 10/21/2023   11:36 AM  Readmission Risk Prevention Plan  Transportation Screening Complete Complete  PCP or Specialist Appt within 5-7 Days  Complete  Home Care Screening  Complete  Medication Review (RN CM)  Complete  Medication Review (RN Care Manager) Referral to Pharmacy   PCP or Specialist appointment within 3-5 days of discharge Complete   HRI or Home Care Consult Complete   SW Recovery Care/Counseling Consult Complete   Palliative Care Screening Not Applicable   Skilled Nursing Facility Not Applicable

## 2024-02-24 NOTE — Progress Notes (Signed)
 LUE PICC removed per policy.  Pt verbalizes home care via teachback method.  No AND.

## 2024-02-24 NOTE — Progress Notes (Addendum)
 Advanced Heart Failure Rounding Note  Cardiologist: Alm Clay, MD  HF Consulting Cardiologist: Dr. Zenaida Chief Complaint: Weakness Subjective:    Appeal for CIR denied. Pt will discharge home. Stable from a HF standpoint from discharge.   Feeling well today, remains in normal rhythm. Walked well with PT.   Objective:    Weight Range: 75.8 kg Body mass index is 22.66 kg/m.   Vital Signs:   Temp:  [97.7 F (36.5 C)-98.8 F (37.1 C)] 98.3 F (36.8 C) (10/02 1126) Pulse Rate:  [64-86] 68 (10/02 1126) Resp:  [16-20] 18 (10/02 1126) BP: (102-115)/(66-79) 106/77 (10/02 1126) SpO2:  [90 %-96 %] 96 % (10/02 1126) Last BM Date : 02/23/24  Weight change: Filed Weights   02/21/24 0532 02/22/24 0352 02/23/24 0500  Weight: 77.9 kg 76.8 kg 75.8 kg   Intake/Output:  Intake/Output Summary (Last 24 hours) at 02/24/2024 1459 Last data filed at 02/24/2024 0750 Gross per 24 hour  Intake 120 ml  Output 700 ml  Net -580 ml    Physical Exam   General: Elderly appearing. No distress on RA Cardiac: JVP flat. S1 and S2 present. No murmurs or rub. Extremities: Warm and dry.  No peripheral edema. + TED hose Neuro: Alert and oriented x3. Affect pleasant.   Telemetry   SR 70s LBBB (personally reviewed)  Labs    CBC Recent Labs    02/23/24 0323 02/24/24 0348  WBC 10.0 7.9  HGB 12.2* 11.5*  HCT 37.5* 36.0*  MCV 86.8 86.7  PLT 505* 443*   Basic Metabolic Panel Recent Labs    89/98/74 0323 02/24/24 0348  NA 132* 132*  K 4.8 4.4  CL 106 105  CO2 19* 20*  GLUCOSE 120* 127*  BUN 25* 21  CREATININE 1.45* 1.34*  CALCIUM  8.7* 8.5*  MG 2.1 2.0   Liver Function Tests Recent Labs    02/23/24 0323 02/24/24 0348  AST 19 20  ALT 19 20  ALKPHOS 74 66  BILITOT 0.6 0.5  PROT 6.0* 5.6*  ALBUMIN 2.8* 2.6*    Medications:    Scheduled Medications:  amiodarone   200 mg Oral BID   apixaban   5 mg Oral BID   Chlorhexidine  Gluconate Cloth  6 each Topical Q0600    clopidogrel   75 mg Oral Daily   digoxin   0.0625 mg Oral Daily   feeding supplement  1 Container Oral TID BM   Gerhardt's butt cream   Topical BID   magic mouthwash  5 mL Oral QID   melatonin  5 mg Oral QHS   multivitamin with minerals  1 tablet Oral Daily   nystatin    Topical BID   pantoprazole   40 mg Oral BID   rifaximin   550 mg Oral TID   rosuvastatin   20 mg Oral Daily   sodium chloride  flush  10-40 mL Intracatheter Q12H   spironolactone   25 mg Oral Daily    Infusions:   PRN Medications: acetaminophen , ALPRAZolam , atropine , diphenhydrAMINE , guaiFENesin -dextromethorphan , ipratropium-albuterol , metoCLOPramide  (REGLAN ) injection, mouth rinse, polyethylene glycol, simethicone , sodium chloride  flush  Patient Profile   Mr Dalgleish is a 78 year old with a history of ICM, chronic biventricular HFrEF, CAD s/p PCI to LAD in 08/25.      Readmitted 01/22/24 with abdominal pain/nausea/vomiting. Hospital course complicated by ischemic colitis w/ septic shock/GI bleed, stent thrombosis involving recent LAD stent and low-output HF.   Assessment/Plan   CAD, recent STEMI d/t stent thrombosis - 01/17/24- PCI/DES LAD  - 02/01/24 Cangrelor  stopped  given Plavix  load and ST elevation. Acute stent thrombosis involving LAD stent treated with PTCA. - Plavix  Genetic Testing collected, pending (sent 9/10, checked with lab 9/22 and still in process) - Switched to Plavix , received load - Off aspirin  with GI bleed.  - Continue high-intensity statin.   New A fib RVR - Given his low output HF, would avoid giving additional IV beta blocker. - Continue Eliquis  5 bid. Hgb stable. - Continue digoxin  0.0625 - s/p successful DCCV yesterday - remains in NSR - continue amio 200 mg bid   A/C HFrEF, ICM   - 01/13/24 Echo EF  20-25% RV moderately reduced.   - 02/01/24 Echo LVEF 20-25% RV moderately reduced.    - Started on milrinone , now off. Co-ox stable - TEE 9/29 with EF 20-25, mod HK RV - Hold diuresis with  orthostasis - Off SGLT2i with fungal rash - Continue spiro 25 mg daily - Continue digoxin  0.0625 mcg daily. Dig level < 0.6. - No BP room to further titrate GDMT - Renal function stable.    Anemia in the setting of GI Bleed -Transfused 9/8 with appropriate rise.  - 9/10 he had large bloody BM. Unfortunately given 600 mg Ibuprofen .  - Protonix  and Pepcid  per GI - GI following.  - S/p EGD and flex sig.  EGD with 1 superficial esophageal ulcer, LA grade C reflux esophagitis with no bleeding. Flex sig: Red blood in the rectum, sigmoid colon, descending colon and transverse colon. Few diverticula found. No obvious source of bleeding, possibly diverticular per GI.  Intestinal pneumatosis w/ concern for ischemic colitis Ileus Psuedoobstruction - Gen surgery recommended conservative management - Treated with antibiotics for suspected septic shock. - Transverse colon measures 9.3 cm on Xray last week.  - CT A/P: 9/22 w/ dilated loops of small bowel and colon w/ air fluid levels in distal colon and rectum w/ obstruction or pneumatosis - Improved with decompression. Appreciate GI.   Hypokalemia - resolved  Orthostatic Hypotension - positive orthostatic vitals yesterday - hold diuresis - needs to wear ted hose  CIR/SNF denied twice. Will plan to go home with home with spouse. Medically ready for discharge from a HF standpoint.  Heart failure team will sign off as of 02/24/24  HF Team Medication Recommendations for Home: - amiodarone  200 mg bid - apixaban  5 mg bid - clopidegrel 75 mg daily - crestor  20 mg daily - furosemide  40 mg daily - spirolactone 25 mg daily  Has follow up appointment in AHF Clinic.   Code status: DNR   Length of Stay: 23  [place] Lee, NP  02/24/2024, 2:59 PM  Advanced Heart Failure Team Pager [phone] (M-F; 7a - 5p)  Please contact [facility] for night-coverage after hours (5p -7a ) and weekends on [email or web address]  Patient seen and examined with the  above-signed Advanced Practice Provider and/or Housestaff. I personally reviewed laboratory data, imaging studies and relevant notes. I independently examined the patient and formulated the important aspects of the plan. I have edited the note to reflect any of my changes or salient points. I have personally discussed the plan with the patient and/or family.  Remains in NSR. BP stable no more orthostasis. Volume status ok   General:  Sitting up in bed . No resp difficulty HEENT: normal Neck: supple. no JVD. Carotids 2+ bilat; no bruits. No lymphadenopathy or thryomegaly appreciated. Cor: PMI nondisplaced. Regular rate & rhythm. No rubs, gallops or murmurs. Lungs: clear Abdomen: soft, nontender, nondistended. No hepatosplenomegaly. No bruits or  masses. Good bowel sounds. Extremities: no cyanosis, clubbing, rash, edema Neuro: alert & orientedx3, cranial nerves grossly intact. moves all 4 extremities w/o difficulty. Affect pleasant  Stable for d/c from HF standpoint. Will arrange outpatient f/u.   Toribio Fuel, MD  3:43 PM

## 2024-02-24 NOTE — Progress Notes (Signed)
 Inpatient Rehab Admissions Coordinator:   I received a denial from [facility].  Updated pt/spouse at bedside and they prefer discharge home when medically cleared.  I notified TOC, therapy, and MD.  We will sign off for CIR at this time.   Reche Lowers, PT, DPT Admissions Coordinator [phone] 02/24/24  1:48 PM

## 2024-02-25 ENCOUNTER — Other Ambulatory Visit (HOSPITAL_COMMUNITY): Payer: Self-pay

## 2024-02-25 DIAGNOSIS — K5981 Ogilvie syndrome: Secondary | ICD-10-CM | POA: Diagnosis not present

## 2024-02-25 NOTE — Progress Notes (Signed)
 Patient is feeling better, rectal tube and picc line have been removed.  No chest pain, no dyspnea, no nausea or vomiting, bowel movements with formed stools.   BP 102/61 (BP Location: Right Arm)   Pulse 65   Temp 98.1 F (36.7 C) (Oral)   Resp 18   Ht 6' (1.829 m)   Wt 76.6 kg   SpO2 94%   BMI 22.90 kg/m   Neurology awake and alert ENT with no pallor Cardiovascular with S1 and S2 present and regular, with no gallops, mild systolic murmur at the left lower sternal border Respiratory with no rales or wheezing Abdomen soft and non tender No lower extremity edema  Compensated heart failure and resolved colonic ileus Plan for discharge home and follow up as outpatient Confirmed with Cardiology, plan to continue digoxin .

## 2024-02-25 NOTE — TOC Transition Note (Signed)
 Transition of Care [facility]) - Discharge Note   Patient Details  Name: Chase Combs MRN: [id number] Date of Birth: 11-14-1945  Transition of Care [facility] CM/SW Contact:  Justina Delcia Czar, RN Phone Number: [phone] 02/25/2024, 9:45 AM   Clinical Narrative:    Spoke to pt and wife will transport home. Has needed DME at home. Contacted [facility] to make aware of dc home.   PCP hospital follow up scheduled for 02/29/2024 at 2 pm.    Final next level of care: Home w Home Health Services Barriers to Discharge: No Barriers Identified   Patient Goals and CMS Choice Patient states their goals for this hospitalization and ongoing recovery are:: wants to remain independent CMS Medicare.gov Compare Post Acute Care list provided to:: Patient Choice offered to / list presented to : Patient      Discharge Placement                       Discharge Plan and Services Additional resources added to the After Visit Summary for     Discharge Planning Services: CM Consult                      HH Arranged: RN, PT, OT [facility] Agency: [facility] Date [facility] Agency Contacted: 02/24/24 Time HH Agency Contacted: 1623 Representative spoke with at [facility] Agency: [name]  Social Drivers of Health (SDOH) Interventions SDOH Screenings   Food Insecurity: No Food Insecurity (02/06/2024)  Housing: Low Risk  (02/06/2024)  Transportation Needs: No Transportation Needs (02/06/2024)  Utilities: Not At Risk (02/06/2024)  Social Connections: Moderately Integrated (02/06/2024)  Tobacco Use: Low Risk  (02/02/2024)     Readmission Risk Interventions    01/26/2024   12:46 PM 10/21/2023   11:36 AM  Readmission Risk Prevention Plan  Transportation Screening Complete Complete  PCP or Specialist Appt within 5-7 Days  Complete  Home Care Screening  Complete  Medication Review (RN CM)  Complete  Medication Review (RN Care Manager) Referral to Pharmacy   PCP or Specialist appointment within 3-5 days of  discharge Complete   HRI or Home Care Consult Complete   SW Recovery Care/Counseling Consult Complete   Palliative Care Screening Not Applicable   Skilled Nursing Facility Not Applicable

## 2024-02-29 DIAGNOSIS — Z742 Need for assistance at home and no other household member able to render care: Secondary | ICD-10-CM | POA: Diagnosis not present

## 2024-02-29 DIAGNOSIS — R110 Nausea: Secondary | ICD-10-CM | POA: Diagnosis not present

## 2024-02-29 DIAGNOSIS — K219 Gastro-esophageal reflux disease without esophagitis: Secondary | ICD-10-CM | POA: Diagnosis not present

## 2024-02-29 DIAGNOSIS — K5981 Ogilvie syndrome: Secondary | ICD-10-CM | POA: Diagnosis not present

## 2024-03-01 ENCOUNTER — Telehealth (HOSPITAL_COMMUNITY): Payer: Self-pay

## 2024-03-01 NOTE — Telephone Encounter (Signed)
 Called patient regarding interest in cardiac rehab, patient states he is not sure if he wants to attend program and will let us  know after his f/u on 10/16.

## 2024-03-06 ENCOUNTER — Telehealth: Payer: Self-pay | Admitting: Cardiology

## 2024-03-06 NOTE — Telephone Encounter (Signed)
 Update and Bp values   Seated bp 140/70 Sitting 90/60  In general he feels worst today and doesn't report a big different between seated and sitting Bp  If questions or concerns you are welcomed to call therapist or patient

## 2024-03-06 NOTE — Telephone Encounter (Signed)
 My recommendation will be to hold spironolactone  for now.  He needs to make sure that he stays adequately hydrated.  Unfortunately has not been seen since his hospitalization.  He had a very complex difficult hospitalization and should have close follow-up scheduled if not already scheduled. it may be with heart failure.  Alm Clay, MD

## 2024-03-07 NOTE — Telephone Encounter (Signed)
 Called patient with Dr Anner comments/  Per Dr Anner , Spironolactone  25 mg placed on hold   Keep hydrated , and keep upcomming appt with  Adv Hf.  Patient verbalized understanding.   Placed comment on medication list

## 2024-03-08 ENCOUNTER — Telehealth (HOSPITAL_COMMUNITY): Payer: Self-pay

## 2024-03-08 NOTE — Telephone Encounter (Signed)
 Called to confirm/remind patient of their appointment at the Advanced Heart Failure Clinic on 03/09/24.   Appointment:   [] Confirmed  [] Left mess   [] No answer/No voice mail  [] VM Full/unable to leave message  [x] Phone not in service

## 2024-03-09 ENCOUNTER — Encounter (HOSPITAL_COMMUNITY): Payer: Self-pay

## 2024-03-09 ENCOUNTER — Ambulatory Visit (HOSPITAL_COMMUNITY)
Admit: 2024-03-09 | Discharge: 2024-03-09 | Disposition: A | Discharge status: Home / Self Care | Attending: Cardiology | Admitting: Cardiology

## 2024-03-09 ENCOUNTER — Ambulatory Visit (HOSPITAL_COMMUNITY): Payer: Self-pay | Admitting: Cardiology

## 2024-03-09 VITALS — BP 102/68 | HR 81 | Wt 176.0 lb

## 2024-03-09 DIAGNOSIS — K922 Gastrointestinal hemorrhage, unspecified: Secondary | ICD-10-CM | POA: Insufficient documentation

## 2024-03-09 DIAGNOSIS — Z7901 Long term (current) use of anticoagulants: Secondary | ICD-10-CM | POA: Diagnosis not present

## 2024-03-09 DIAGNOSIS — Z79899 Other long term (current) drug therapy: Secondary | ICD-10-CM | POA: Insufficient documentation

## 2024-03-09 DIAGNOSIS — I951 Orthostatic hypotension: Secondary | ICD-10-CM | POA: Diagnosis not present

## 2024-03-09 DIAGNOSIS — I5022 Chronic systolic (congestive) heart failure: Secondary | ICD-10-CM | POA: Insufficient documentation

## 2024-03-09 DIAGNOSIS — I2510 Atherosclerotic heart disease of native coronary artery without angina pectoris: Secondary | ICD-10-CM | POA: Insufficient documentation

## 2024-03-09 DIAGNOSIS — I255 Ischemic cardiomyopathy: Secondary | ICD-10-CM

## 2024-03-09 DIAGNOSIS — D500 Iron deficiency anemia secondary to blood loss (chronic): Secondary | ICD-10-CM | POA: Insufficient documentation

## 2024-03-09 DIAGNOSIS — I480 Paroxysmal atrial fibrillation: Secondary | ICD-10-CM | POA: Diagnosis not present

## 2024-03-09 DIAGNOSIS — Z7902 Long term (current) use of antithrombotics/antiplatelets: Secondary | ICD-10-CM | POA: Diagnosis not present

## 2024-03-09 DIAGNOSIS — I110 Hypertensive heart disease with heart failure: Secondary | ICD-10-CM | POA: Insufficient documentation

## 2024-03-09 DIAGNOSIS — Z955 Presence of coronary angioplasty implant and graft: Secondary | ICD-10-CM | POA: Insufficient documentation

## 2024-03-09 DIAGNOSIS — D509 Iron deficiency anemia, unspecified: Secondary | ICD-10-CM

## 2024-03-09 DIAGNOSIS — T82867D Thrombosis of cardiac prosthetic devices, implants and grafts, subsequent encounter: Secondary | ICD-10-CM | POA: Diagnosis not present

## 2024-03-09 LAB — IRON AND TIBC
Iron: 48 ug/dL (ref 45–182)
Saturation Ratios: 14 % — ABNORMAL LOW (ref 17.9–39.5)
TIBC: 333 ug/dL (ref 250–450)
UIBC: 285 ug/dL

## 2024-03-09 LAB — CBC
HCT: 37.2 % — ABNORMAL LOW (ref 39.0–52.0)
Hemoglobin: 11.9 g/dL — ABNORMAL LOW (ref 13.0–17.0)
MCH: 27.9 pg (ref 26.0–34.0)
MCHC: 32 g/dL (ref 30.0–36.0)
MCV: 87.3 fL (ref 80.0–100.0)
Platelets: 489 K/uL — ABNORMAL HIGH (ref 150–400)
RBC: 4.26 MIL/uL (ref 4.22–5.81)
RDW: 18.3 % — ABNORMAL HIGH (ref 11.5–15.5)
WBC: 7.3 K/uL (ref 4.0–10.5)
nRBC: 0 % (ref 0.0–0.2)

## 2024-03-09 LAB — BASIC METABOLIC PANEL WITH GFR
Anion gap: 14 (ref 5–15)
BUN: 12 mg/dL (ref 8–23)
CO2: 25 mmol/L (ref 22–32)
Calcium: 9 mg/dL (ref 8.9–10.3)
Chloride: 99 mmol/L (ref 98–111)
Creatinine, Ser: 0.94 mg/dL (ref 0.61–1.24)
GFR, Estimated: >60 mL/min (ref >60)
Glucose, Bld: 102 mg/dL — ABNORMAL HIGH (ref 70–99)
Potassium: 3.4 mmol/L — ABNORMAL LOW (ref 3.5–5.1)
Sodium: 138 mmol/L (ref 135–145)

## 2024-03-09 LAB — BRAIN NATRIURETIC PEPTIDE: B Natriuretic Peptide: 618.6 pg/mL — ABNORMAL HIGH (ref 0.0–100.0)

## 2024-03-09 LAB — FERRITIN: Ferritin: 233 ng/mL (ref 24–336)

## 2024-03-09 NOTE — Progress Notes (Signed)
 ADVANCED HF CLINIC NOTE  Primary Care: Wonda Worth [place], [place] Primary Cardiologist: Alm Clay, MD HF Cardiologist: Dr. Zenaida  HPI: Chase Combs is a 78 y.o. male with history of chronic biventricular HFrEF due to ICM and new paroxysmal atrial fibrillation.   Admitted 8/25 with NSTEMI s/p DES to LAD. Echo 8/25 EF 20-25% (previously 40-45% 2/25), mod RV reduced.   Unfortunately readmitted later that month with constipation and GI bleed. Underwent repeat cardiac cath 02/01/24 showing LAD stent thrombosis that was ballooned with difficulty with good final result. Was noted to be in cardiogenic shock and was started on milrinone  breifly and diuresed. Developed AF with RVR and underwent TEE/DCCV 02/21/24. Echo was repeated minimal change. Course complicated by deconditioning and orthostatic hypotension. Had discharge wt of 75.8 kg.   He returns today for heart failure follow up with his wife. Overall feeling okay, although still deconditioned. Working with home PT once weekly. Hesitant to start cardiac rehab, appears anxious about it. NYHA III. Reports dyspnea, fatigue, and orthostatic hypotension. Reports wearing compression socks, but is just wearin standard high socks, we discussed the difference. Denies chest pain, lower extremity edema, palpitations, and abnormal bleeding. Appetite okay. Compliant with all medications.  Past Medical History:  Diagnosis Date   Abdominal hernia    per pt   Arthritis    hands   Benign localized prostatic hyperplasia with lower urinary tract symptoms (LUTS)    CAD (coronary artery disease) 05/2004   cardiologist--- dr clay;  positive stress test , had outpt cardiac cath 05-27-2004 stenosis RI;  06-18-2004 cath w/ PCI and BMS x1 to pRI;   STEMI 02-16-2009  s/p cath w/ PCI thrombectomy for total occluded RCA , BMS x1 to pRCA;  nuclear stress test 03-15-2012 low risk no ischemia but sig inferior-inferolateral infarct , ef 51%   Dyslipidemia    Essential  hypertension    Heart murmur    History of COVID-19 05/2020   per pt mild symptoms that resolved   History of ST elevation myocardial infarction (STEMI) 02/16/2009   inferior STEMI   cath w/ pci w/ BMS to pRCA   History of urinary retention    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   urologist--- dr borden/ radiation oncology-- dr patrcia;  dx 12/ 2022,  Gleason 4+5, PSA 24.6, volume 94.6cc;  plan to start IMRT   OSA (obstructive sleep apnea)    per pt dx approx 2008, no cpap intolerant   PONV (postoperative nausea and vomiting)    Pre-diabetes    S/p bare metal coronary artery stent    01/ 2006  x1 BMS to pRI (CoStar study stent);  and 09/ 2010  x1 BMS to pRCA   Status post total knee replacement, 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 60 tablet 0   apixaban  (ELIQUIS ) 5 MG TABS tablet Take 1 tablet (5 mg total) by mouth 2 (two) times daily. 60 tablet 0   CALCIUM  PO Take 1 tablet by mouth daily.     clopidogrel  (PLAVIX ) 75 MG tablet Take 1 tablet (75 mg total) by mouth daily. 30 tablet 0   clotrimazole-betamethasone (LOTRISONE) cream Apply 1 Application topically 2 (two) times daily as needed (skin irritation).     Cyanocobalamin  (VITAMIN B-12 PO) Take 1 tablet by mouth daily.     digoxin  (LANOXIN ) 0.125 MG  tablet Take 0.5 tablets (0.0625 mg total) by mouth daily. 15 tablet 0   Multiple Vitamins-Minerals (MULTIVITAMIN MEN 50+) TABS Take 1 tablet by mouth daily.     pantoprazole  (PROTONIX ) 40 MG tablet Take 1 tablet (40 mg total) by mouth daily. 30 tablet 0   Probiotic Product (PROBIOTIC PO) Take 1 capsule by mouth daily.     rosuvastatin  (CRESTOR ) 20 MG tablet TAKE 1 TABLET(20 MG) BY MOUTH DAILY 30 tablet 11   tamsulosin  (FLOMAX ) 0.4 MG CAPS capsule Take 1 capsule (0.4 mg total) by mouth daily after supper. 30 capsule 5   No current  facility-administered medications for this encounter.    Allergies  Allergen Reactions   Codeine Nausea And Vomiting   Latex Rash    Oral blisters when used at dentist      Social History   Socioeconomic History   Marital status: Married    Spouse name: [name]   Number of children: 3   Years of education: Not on file   Highest education level: Not on file  Occupational History   Not on file  Tobacco Use   Smoking status: Never   Smokeless tobacco: Never  Vaping Use   Vaping status: Never Used  Substance and Sexual Activity   Alcohol use: No    Alcohol/week: 0.0 standard drinks of alcohol   Drug use: Never   Sexual activity: Not on file  Other Topics Concern   Not on file  Social History Narrative   He is married, father of 3, grandfather of 5. Never smoked. Does not drink.    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    He does lots of lifting, pulling with ropes, using upper extremities.       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   Social Drivers of Corporate investment banker Strain: Not on file  Food Insecurity: No Food Insecurity (02/06/2024)   Hunger Vital Sign    Worried About Running Out of Food in the Last Year: Never true    Ran Out of Food in the Last Year: Never true  Transportation Needs: No Transportation Needs (02/06/2024)   PRAPARE - Administrator, Civil Service (Medical): No    Lack of Transportation (Non-Medical): No  Physical Activity: Not on file  Stress: Not on file  Social Connections: Moderately Integrated (02/06/2024)   Social Connection and Isolation Panel    Frequency of Communication with Friends and Family: Twice a week    Frequency of Social Gatherings with Friends and Family: Once a week    Attends Religious Services: 1  to 4 times per year    Active Member of [employer] or Organizations: No    Attends Banker Meetings: Never    Marital Status: Married  Catering manager Violence: Not At Risk (02/06/2024)   Humiliation, Afraid, Rape, and Kick questionnaire    Fear of Current or Ex-Partner: No    Emotionally Abused: No    Physically Abused: No    Sexually Abused: No      Family History  Problem Relation Age of Onset   Lung cancer Mother    Colon cancer Neg Hx    Esophageal cancer Neg Hx    Blood pressure 102/68,  pulse 81, weight 79.8 kg (176 lb), SpO2 95%.  Filed Weights   03/09/24 1424  Weight: 79.8 kg (176 lb)   PHYSICAL EXAM: General: Elderly appearing. No distress on RA Cardiac: JVP flat. S1 and S2 present. No murmurs Resp: Lung sounds clear and equal B/L Extremities: Warm and dry.  Trace BLE edema.  Neuro: Alert and oriented x3. Affect pleasant. Using walker  ECG (personally reviewed): NSR 83 bpm with RBBB, QRS 174 ms  ASSESSMENT & PLAN:  Chronic HFrEF, ICM   - Echo 8/25 and 9/25 EF  20-25% RV moderately reduced.      - Recent admission 9/25 requiring milrinone .  - GDMT limited by orthostatic hypotension - stop spiro and lasix , encouraged to increase PO intake - off SGLT2i with fungal rash - holding beta blocker with recent low output - continue digoxin  0.0625 mcg daily, hold prior to next appointment - BMET/BNP today - plans to start cardiac rehab after [facility] PT complete   Paroxysmal Atrial Fibrillation - new diagnosis - s/p successful DCCV 10/25 - ECG today with NSR - continue eliquis  5 bid, CBC today - continue amio 200 mg bid, decrease at next follow up   CAD, STEMI d/t stent thrombosis - s/p DES to LAD 8/25 - Plavix  Genetic Testing completed, rapid metabolizers; continue plavix  d/w PharmD - off aspirin  with GI bleed.  - continue crestor  20 mg daily   Blood Loss Anemia - recent GIB; off ASA - EGD and flex sig 9/25 with espoagal ulcer w/o bleeding, red blood in  rectum and throuhgout colon, and few diverticula. 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 blocker/assess volume)  [place] Zhoe Catania, NP 03/09/24

## 2024-03-09 NOTE — Patient Instructions (Signed)
 STOP Lasix   Stop Spironolactone   Labs done today, your results will be available in MyChart, we will contact you for abnormal readings.  Your physician recommends that you schedule a follow-up appointment as scheduled  If you have any questions or concerns before your next appointment please send us  a message through [place] or call our office at [phone].    TO LEAVE A MESSAGE FOR THE NURSE SELECT OPTION 2, PLEASE LEAVE A MESSAGE INCLUDING: YOUR NAME DATE OF BIRTH CALL BACK NUMBER REASON FOR CALL**this is important as we prioritize the call backs  YOU WILL RECEIVE A CALL BACK THE SAME DAY AS LONG AS YOU CALL BEFORE 4:00 PM At the Advanced Heart Failure Clinic, you and your health needs are our priority. As part of our continuing mission to provide you with exceptional heart care, we have created designated Provider Care Teams. These Care Teams include your primary Cardiologist (physician) and Advanced Practice Providers (APPs- Physician Assistants and Nurse Practitioners) who all work together to provide you with the care you need, when you need it.   You may see any of the following providers on your designated Care Team at your next follow up: Dr Toribio Fuel Dr Ezra Shuck Dr. Ria Commander Dr. Morene Brownie Amy Lenetta, NP Caffie Shed, [place] [facility] Bunch, [place] Beckey Coe, NP [place] Lee, NP Ellouise Class, NP Tinnie Redman, PharmD Jaun Bash, PharmD   Please be sure to bring in all your medications bottles to every appointment.    Thank you for choosing [facility]Advanced [facility] Failure [facility]

## 2024-03-10 MED ORDER — EMPAGLIFLOZIN 10 MG PO TABS: 10.0000 mg | ORAL_TABLET | Freq: Every day | ORAL | 6 refills | Status: DC

## 2024-03-10 NOTE — Addendum Note (Signed)
 Addended by: Madalynn Pickelsimer, DALTON HERO on: 03/10/2024 12:32 PM   Modules accepted: Orders

## 2024-03-14 ENCOUNTER — Telehealth (HOSPITAL_COMMUNITY): Payer: Self-pay

## 2024-03-14 MED ORDER — SPIRONOLACTONE 25 MG PO TABS: 12.5000 mg | ORAL_TABLET | Freq: Every day | ORAL | 3 refills | Status: DC

## 2024-03-14 NOTE — Telephone Encounter (Signed)
 Advanced Heart Failure Triage Encounter  Patient Name: Chase Combs  Date of Call: 03/14/24  Problem:  Patient has gained 5lbs in 5 days 171 lbs to 176 lbs. Patient denies swelling. Some shortness of breath.  Plan:  Sent to provider for further review   Rolin LOISE Height, CMA

## 2024-03-14 NOTE — Telephone Encounter (Signed)
 Patient notified. Patient already has a follow up appointment scheduled 10/30.

## 2024-03-16 DIAGNOSIS — H2513 Age-related nuclear cataract, bilateral: Secondary | ICD-10-CM | POA: Diagnosis not present

## 2024-03-16 DIAGNOSIS — H43393 Other vitreous opacities, bilateral: Secondary | ICD-10-CM | POA: Diagnosis not present

## 2024-03-16 DIAGNOSIS — H35033 Hypertensive retinopathy, bilateral: Secondary | ICD-10-CM | POA: Diagnosis not present

## 2024-03-16 DIAGNOSIS — H2181 Floppy iris syndrome: Secondary | ICD-10-CM | POA: Diagnosis not present

## 2024-03-20 ENCOUNTER — Telehealth: Payer: Self-pay | Admitting: Cardiology

## 2024-03-20 ENCOUNTER — Other Ambulatory Visit (HOSPITAL_COMMUNITY): Payer: Self-pay

## 2024-03-20 NOTE — Telephone Encounter (Signed)
*  STAT* If patient is at the pharmacy, call can be transferred to refill team.   1. Which medications need to be refilled? (please list name of each medication and dose if known)  apixaban  (ELIQUIS ) 5 MG TABS tablet  digoxin  (LANOXIN ) 0.125 MG tablet  clopidogrel  (PLAVIX ) 75 MG tablet  pantoprazole  (PROTONIX ) 40 MG tablet    2. Would you like to learn more about the convenience, safety, & potential cost savings by using the [facility]?    3. Are you open to using the [facility] (Type [facility].  ).  4. Which pharmacy/location (including street and city if local pharmacy) is medication to be sent to? [facility]  5. Do they need a 30 day or 90 day supply? 30 day

## 2024-03-22 ENCOUNTER — Telehealth (HOSPITAL_COMMUNITY): Payer: Self-pay

## 2024-03-22 ENCOUNTER — Other Ambulatory Visit (HOSPITAL_COMMUNITY): Payer: Self-pay

## 2024-03-22 ENCOUNTER — Telehealth: Payer: Self-pay | Admitting: Cardiology

## 2024-03-22 NOTE — Telephone Encounter (Signed)
 Calling to report the patient weight 189.6 up 4lbs in the last two days. Lungs are clear. Please advise

## 2024-03-22 NOTE — Telephone Encounter (Signed)
 Spoke with pt regarding increased weight, up to 189.6lbs, which is 4lbs more than his normal dry weight. Pt is compliant with his medications. Pt states he has no new shortness of breath, he states at baseline his has some shortness of breath but that this hasn't changed. Pt does notice some slight swelling in his ankles. Pt is scheduled to be seen in the heart failure clinic tomorrow at 2pm. Advised pt that we could address increased weight at office visit tomorrow. ED precautions given to pt for acute matters. Pt verbalizes understanding.

## 2024-03-22 NOTE — Telephone Encounter (Signed)
 Called to confirm/remind patient of their appointment at the [facility] on 03/23/24.   Appointment:   [x] Confirmed  [] Left mess   [] No answer/No voice mail  [] VM Full/unable to leave message  [] Phone not in service  Patient reminded to bring all medications and/or complete list.  Confirmed patient has transportation. Gave directions, instructed to utilize valet parking.

## 2024-03-23 ENCOUNTER — Other Ambulatory Visit (HOSPITAL_COMMUNITY): Payer: Self-pay

## 2024-03-23 ENCOUNTER — Encounter (HOSPITAL_COMMUNITY): Payer: Self-pay

## 2024-03-23 ENCOUNTER — Ambulatory Visit (HOSPITAL_COMMUNITY)
Admission: RE | Admit: 2024-03-23 | Discharge: 2024-03-23 | Disposition: A | Discharge status: Home / Self Care | Source: Ambulatory Visit | Attending: Physician Assistant | Admitting: Physician Assistant

## 2024-03-23 ENCOUNTER — Ambulatory Visit (HOSPITAL_COMMUNITY): Payer: Self-pay | Admitting: Physician Assistant

## 2024-03-23 ENCOUNTER — Other Ambulatory Visit (HOSPITAL_COMMUNITY): Payer: Self-pay | Admitting: *Deleted

## 2024-03-23 VITALS — BP 138/82 | HR 91 | Ht 72.0 in | Wt 196.2 lb

## 2024-03-23 DIAGNOSIS — I5022 Chronic systolic (congestive) heart failure: Secondary | ICD-10-CM

## 2024-03-23 DIAGNOSIS — I2510 Atherosclerotic heart disease of native coronary artery without angina pectoris: Secondary | ICD-10-CM | POA: Diagnosis not present

## 2024-03-23 DIAGNOSIS — I4510 Unspecified right bundle-branch block: Secondary | ICD-10-CM | POA: Diagnosis not present

## 2024-03-23 DIAGNOSIS — Z79899 Other long term (current) drug therapy: Secondary | ICD-10-CM | POA: Diagnosis not present

## 2024-03-23 DIAGNOSIS — Z8616 Personal history of COVID-19: Secondary | ICD-10-CM | POA: Diagnosis not present

## 2024-03-23 DIAGNOSIS — D508 Other iron deficiency anemias: Secondary | ICD-10-CM

## 2024-03-23 DIAGNOSIS — I255 Ischemic cardiomyopathy: Secondary | ICD-10-CM | POA: Diagnosis not present

## 2024-03-23 DIAGNOSIS — Z7901 Long term (current) use of anticoagulants: Secondary | ICD-10-CM | POA: Diagnosis not present

## 2024-03-23 DIAGNOSIS — Z8719 Personal history of other diseases of the digestive system: Secondary | ICD-10-CM | POA: Diagnosis not present

## 2024-03-23 DIAGNOSIS — E8770 Fluid overload, unspecified: Secondary | ICD-10-CM | POA: Diagnosis not present

## 2024-03-23 DIAGNOSIS — I4892 Unspecified atrial flutter: Secondary | ICD-10-CM | POA: Diagnosis not present

## 2024-03-23 DIAGNOSIS — I480 Paroxysmal atrial fibrillation: Secondary | ICD-10-CM | POA: Diagnosis not present

## 2024-03-23 DIAGNOSIS — Z955 Presence of coronary angioplasty implant and graft: Secondary | ICD-10-CM | POA: Diagnosis not present

## 2024-03-23 DIAGNOSIS — I110 Hypertensive heart disease with heart failure: Secondary | ICD-10-CM | POA: Diagnosis not present

## 2024-03-23 DIAGNOSIS — D509 Iron deficiency anemia, unspecified: Secondary | ICD-10-CM | POA: Diagnosis not present

## 2024-03-23 DIAGNOSIS — I440 Atrioventricular block, first degree: Secondary | ICD-10-CM | POA: Insufficient documentation

## 2024-03-23 DIAGNOSIS — I252 Old myocardial infarction: Secondary | ICD-10-CM | POA: Insufficient documentation

## 2024-03-23 DIAGNOSIS — I493 Ventricular premature depolarization: Secondary | ICD-10-CM | POA: Diagnosis not present

## 2024-03-23 LAB — COMPREHENSIVE METABOLIC PANEL WITH GFR
ALT: 17 U/L (ref 0–44)
AST: 20 U/L (ref 15–41)
Albumin: 3.2 g/dL — ABNORMAL LOW (ref 3.5–5.0)
Alkaline Phosphatase: 74 U/L (ref 38–126)
Anion gap: 12 (ref 5–15)
BUN: 9 mg/dL (ref 8–23)
CO2: 22 mmol/L (ref 22–32)
Calcium: 8.7 mg/dL — ABNORMAL LOW (ref 8.9–10.3)
Chloride: 105 mmol/L (ref 98–111)
Creatinine, Ser: 0.96 mg/dL (ref 0.61–1.24)
GFR, Estimated: >60 mL/min (ref >60)
Glucose, Bld: 94 mg/dL (ref 70–99)
Potassium: 3.4 mmol/L — ABNORMAL LOW (ref 3.5–5.1)
Sodium: 139 mmol/L (ref 135–145)
Total Bilirubin: 0.5 mg/dL (ref 0.0–1.2)
Total Protein: 6.4 g/dL — ABNORMAL LOW (ref 6.5–8.1)

## 2024-03-23 LAB — T4, FREE: Free T4: 1.29 ng/dL — ABNORMAL HIGH (ref 0.61–1.12)

## 2024-03-23 LAB — BRAIN NATRIURETIC PEPTIDE: B Natriuretic Peptide: 1985.4 pg/mL — ABNORMAL HIGH (ref 0.0–100.0)

## 2024-03-23 LAB — TSH: TSH: 2.31 u[IU]/mL (ref 0.350–4.500)

## 2024-03-23 MED ORDER — FUROSEMIDE 40 MG PO TABS: 40.0000 mg | ORAL_TABLET | Freq: Every day | ORAL | 3 refills | Status: DC

## 2024-03-23 MED ORDER — APIXABAN 5 MG PO TABS
5.0000 mg | ORAL_TABLET | Freq: Two times a day (BID) | ORAL | 5 refills | Status: DC
  Filled 2024-03-23: qty 60, 30d supply, fill #0
  Filled 2024-06-01: qty 60, 30d supply, fill #1
  Filled 2024-06-02: qty 60, 30d supply, fill #0

## 2024-03-23 MED ORDER — CLOPIDOGREL BISULFATE 75 MG PO TABS
75.0000 mg | ORAL_TABLET | Freq: Every day | ORAL | 5 refills | Status: AC
  Filled 2024-03-23: qty 30, 30d supply, fill #0
  Filled 2024-06-01: qty 30, 30d supply, fill #1
  Filled 2024-06-02: qty 30, 30d supply, fill #0

## 2024-03-23 MED ORDER — DIGOXIN 125 MCG PO TABS
0.0625 mg | ORAL_TABLET | Freq: Every day | ORAL | 5 refills | Status: DC
  Filled 2024-03-23: qty 15, 30d supply, fill #0

## 2024-03-23 MED ORDER — LOSARTAN POTASSIUM 25 MG PO TABS: 25.0000 mg | ORAL_TABLET | Freq: Every day | ORAL | 3 refills | Status: DC

## 2024-03-23 MED ORDER — POTASSIUM CHLORIDE CRYS ER 20 MEQ PO TBCR: 20.0000 meq | EXTENDED_RELEASE_TABLET | Freq: Every day | ORAL | 3 refills | Status: DC

## 2024-03-23 NOTE — Progress Notes (Addendum)
 ADVANCED [facility] CLINIC NOTE  Primary Care: Wonda Worth SQUIBB, PA Primary Cardiologist: Alm Clay, MD HF Cardiologist: Dr. Zenaida   HPI: Chase Combs is a 78 y.o. 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   Admitted 8/25 with NSTEMI s/p DES to LAD. Echo 8/25 EF 20-25% (previously 40-45% 2/25), mod RV reduced. Unfortunately readmitted later that month with GI bleed and intestinal pneumatosis/ileus/pseudoobstruction w/ suuspected septic shock. Underwent repeat cardiac cath 02/01/24 showing LAD stent thrombosis that was ballooned with difficulty with good final result. Was noted to be in cardiogenic shock and was started on milrinone  breifly and diuresed. Developed AFL with RVR and underwent TEE/DCCV 02/21/24. Echo was repeated minimal change. Course complicated by deconditioning and orthostatic hypotension. Had discharge wt of 75.8 kg.   Spiro and lasix  stopped at visit 2 weeks ago d/t orthostatic hypotension. He was later started on jardiance  and spiro d/t weight gain and elevated BNP.  He is here today for CHF follow-up. His home weight is up 18 lb since his last clinic visit, weighs 189 lb today. Having some nausea with new meds. He states he is more short of breath and has more lower extremity edema. Not able to walk as far. Sleeps sitting up in a recliner so hard to tell if he is having orthopnea or PND. Reports having some chest discomfort off and on, nothing predictable. Unable to discern if similar to prior angina. Taking all medications as prescribed.   Past Medical History:  Diagnosis Date   Abdominal hernia    per pt   Arthritis    hands   Benign localized prostatic hyperplasia with lower urinary tract symptoms (LUTS)    CAD (coronary artery disease) 05/2004   cardiologist--- dr clay;  positive stress test , had outpt cardiac cath 05-27-2004 stenosis RI;  06-18-2004 cath w/ PCI and BMS x1 to pRI;   STEMI  02-16-2009  s/p cath w/ PCI thrombectomy for total occluded RCA , BMS x1 to pRCA;  nuclear stress test 03-15-2012 low risk no ischemia but sig inferior-inferolateral infarct , ef 51%   Dyslipidemia    Essential hypertension    Heart murmur    History of COVID-19 05/2020   per pt mild symptoms that resolved   History of ST elevation myocardial infarction (STEMI) 02/16/2009   inferior STEMI   cath w/ pci w/ BMS to pRCA   History of urinary retention    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   urologist--- dr borden/ radiation oncology-- dr patrcia;  dx 12/ 2022,  Gleason 4+5, PSA 24.6, volume 94.6cc;  plan to start IMRT   OSA (obstructive sleep apnea)    per pt dx approx 2008, no cpap intolerant   PONV (postoperative nausea and vomiting)    Pre-diabetes    S/p bare metal coronary artery stent    01/ 2006  x1 BMS to pRI (CoStar study stent);  and 09/ 2010  x1 BMS to pRCA   Status post total knee replacement, 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 60 tablet 0   apixaban  (ELIQUIS ) 5 MG TABS tablet Take 1 tablet (5 mg total) by mouth 2 (two) times daily. 60 tablet 0   CALCIUM  PO Take 1 tablet by mouth daily.  clopidogrel  (PLAVIX ) 75 MG tablet Take 1 tablet (75 mg total) by mouth daily. 30 tablet 0   clotrimazole-betamethasone (LOTRISONE) cream Apply 1 Application topically 2 (two) times daily as needed (skin irritation).     Cyanocobalamin  (VITAMIN B-12 PO) Take 1 tablet by mouth daily.     digoxin  (LANOXIN ) 0.125 MG tablet Take 0.5 tablets (0.0625 mg total) by mouth daily. 15 tablet 0   empagliflozin  (JARDIANCE ) 10 MG TABS tablet Take 1 tablet (10 mg total) by mouth daily before breakfast. 30 tablet 6   Multiple Vitamins-Minerals (MULTIVITAMIN MEN 50+) TABS Take 1 tablet by mouth daily.     pantoprazole  (PROTONIX ) 40 MG tablet Take 1 tablet (40  mg total) by mouth daily. 30 tablet 0   Probiotic Product (PROBIOTIC PO) Take 1 capsule by mouth daily.     rosuvastatin  (CRESTOR ) 20 MG tablet TAKE 1 TABLET(20 MG) BY MOUTH DAILY 30 tablet 11   spironolactone  (ALDACTONE ) 25 MG tablet Take 0.5 tablets (12.5 mg total) by mouth daily. 45 tablet 3   tamsulosin  (FLOMAX ) 0.4 MG CAPS capsule Take 1 capsule (0.4 mg total) by mouth daily after supper. 30 capsule 5   No current facility-administered medications for this visit.    Allergies  Allergen Reactions   Codeine Nausea And Vomiting   Latex Rash    Oral blisters when used at dentist      Social History   Socioeconomic History   Marital status: Married    Spouse name: [name]   Number of children: 3   Years of education: Not on file   Highest education level: Not on file  Occupational History   Not on file  Tobacco Use   Smoking status: Never   Smokeless tobacco: Never  Vaping Use   Vaping status: Never Used  Substance and Sexual Activity   Alcohol use: No    Alcohol/week: 0.0 standard drinks of alcohol   Drug use: Never   Sexual activity: Not on file  Other Topics Concern   Not on file  Social History Narrative   He is married, father of 3, grandfather of 5. Never smoked. Does not drink.    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    He does lots of lifting, pulling with ropes, using upper extremities.       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   Social Drivers of Corporate Investment Banker Strain: Not on file  Food Insecurity: No Food Insecurity (02/06/2024)   Hunger Vital Sign    Worried About Running Out of Food in the Last Year: Never true    Ran Out of Food in the Last Year: Never true  Transportation Needs: No Transportation Needs (02/06/2024)   PRAPARE -  Administrator, Civil Service (Medical): No    Lack of Transportation (Non-Medical): No  Physical Activity: Not on file  Stress: Not on file  Social Connections: Moderately Integrated (02/06/2024)   Social Connection and Isolation Panel    Frequency of Communication with Friends and Family: Twice a week    Frequency of Social Gatherings with Friends and Family: Once a week    Attends Religious Services: 1 to 4 times per year    Active Member of [employer] or Organizations:  No    Attends Banker Meetings: Never    Marital Status: Married  Catering Manager Violence: Not At Risk (02/06/2024)   Humiliation, Afraid, Rape, and Kick questionnaire    Fear of Current or Ex-Partner: No    Emotionally Abused: No    Physically Abused: No    Sexually Abused: No      Family History  Problem Relation Age of Onset   Lung cancer Mother    Colon cancer Neg Hx    Esophageal cancer Neg Hx    There were no vitals taken for this visit.  There were no vitals filed for this visit.  PHYSICAL EXAM: General: Elderly appearing. No distress on RA Cardiac: JVP flat. S1 and S2 present. No murmurs Resp: Lung sounds clear and equal B/L Extremities: Warm and dry.  Trace BLE edema.  Neuro: Alert and oriented x3. Affect pleasant. Using walker  ECG (personally reviewed): AFL with V rate 92 bpm, RBBB  ASSESSMENT & PLAN:  Chronic HFrEF, ICM   - Echo 8/25 and 9/25 EF  20-25% RV moderately reduced.      - Recent admission 9/25 requiring milrinone .  - NYHA III today. Volume overloaded on exam and by ReDS which is 43%. Weight up 20 lb. Restart lasix  at 40 mg daily and 20 meq potassium daily.  - start losartan 25 mg daily, if tolerated switch to entresto  next visit - continue jardiance  10 mg daily - continue digoxin  0.0625 mg daily  - continue spiro 12.5 mg daily - off beta blocker with recent low output - CMET/BNP today - Back in AFL, need to restore SR, see below - plans to start cardiac  rehab after [facility] PT complete   Atrial flutter - new diagnosis during recent admit - s/p successful DCCV 10/25 - ECG today with atrial flutter, controlled rate - continue eliquis  5 bid,  - continue amio 200 mg bid for now. Having some nausea which could be d/t the medication but volume overload may also be playing a role. Arrange for DCCV within the week and plan to reduce amiodarone  in near future. Check TSH and LFTs today.  CAD, STEMI d/t stent thrombosis - s/p DES to LAD 8/25 - Remote hx of PCI to RI in 2006 and RCA in 2010 - Plavix  Genetic Testing completed, rapid metabolizer; continue plavix   - off aspirin  with GI bleed.  - recent chest discomfort may be d/t volume overload. Symptoms atypical for angina. - continue crestor  20 mg daily   Iron deficiency anemia - recent GIB; off ASA - EGD and flex sig 9/25 with esophageal ulcer w/o bleeding, red blood in rectum and throuhgout colon, and few diverticula. No obvious source of bleeding found. Per GI possibly diverticular. - continue protonix  - suspect component of Fe def anemia 2/2 chronic disease - Recent iron stores were low with Tsat 14%. Arrange for IV iron.   Follow up DCCV 11/05 then APP clinic 2 weeks. He was instructed to call the clinic (or report to ED if unstable) prior to cardioversion if has worsening heart failure symptoms.  Kieu Quiggle N, PA-C 03/23/24

## 2024-03-23 NOTE — Patient Instructions (Signed)
 Start Losartan 25 mg daily - Rx sent. Take Lasix  40 mg daily - Rx sent. Take potassium 20 meq daily - Rx sent. Labs today - will call you if abnormal.  We have ordered IV iron for you - they will call to schedule that infusion with you. We have ordered a cardioversion for you per Dr. Zenaida - see below. Return to Heart Failure APP Clinic in 2 weeks - see below. Please call us  at [phone] if any questions or concerns prior to next week.      Dear Chase Combs  You are scheduled for a Cardioversion on Wednesday, November 10 with Dr. Zenaida.  Please arrive at the [facility] (Main Entrance A) at [facility]: [address] [place], [place] [address] at 9:00 AM (This time is one hour(s) before your procedure to ensure your preparation).   Free valet parking service is available. You will check in at ADMITTING.   *Please Note: You will receive a call the day before your procedure to confirm the appointment time. That time may have changed from the original time based on the schedule for that day.*   DIET:  Nothing to eat or drink after midnight except a sip of water  with medications (see medication instructions below)        HOLD:    Jardiance  for 3 days prior to your cardioversion. Last dose Saturday 03/25/24  Do not take your Lasix  or Spiro the day of your cardioversion   FYI:  For your safety, and to allow us  to monitor your vital signs accurately during the surgery/procedure we request: If you have artificial nails, gel coating, SNS etc, please have those removed prior to your surgery/procedure. Not having the nail coverings /polish removed may result in cancellation or delay of your surgery/procedure.  Your support person will be asked to wait in the waiting room during your procedure.  It is OK to have someone drop you off and come back when you are ready to be discharged.  You cannot drive after the procedure and will need someone to drive you home.  Bring your insurance  cards.  *Special Note: Every effort is made to have your procedure done on time. Occasionally there are emergencies that occur at the hospital that may cause delays. Please be patient if a delay does occur.

## 2024-03-23 NOTE — Progress Notes (Signed)
 ReDS Vest / Clip - 03/23/24 1600       ReDS Vest / Clip   Station Marker C    Ruler Value 26    ReDS Value Range High volume overload    ReDS Actual Value 43

## 2024-03-24 ENCOUNTER — Telehealth (HOSPITAL_COMMUNITY): Payer: Self-pay | Admitting: Pharmacy Technician

## 2024-03-24 ENCOUNTER — Other Ambulatory Visit (HOSPITAL_COMMUNITY): Payer: Self-pay

## 2024-03-24 ENCOUNTER — Telehealth (HOSPITAL_COMMUNITY): Payer: Self-pay | Admitting: Physician Assistant

## 2024-03-24 ENCOUNTER — Telehealth (HOSPITAL_COMMUNITY): Payer: Self-pay | Admitting: Cardiology

## 2024-03-24 NOTE — Telephone Encounter (Signed)
 [name],PT with [facility] called to report Sitting b/p 140/80 Standing b/p 130/70  HR 93  Lung sounds- mini lower, moderate upper   Above is FYI unless changes are needed  Patient has complete home PT, today was last visit

## 2024-03-24 NOTE — Telephone Encounter (Signed)
 Patient referred to infusion pharmacy team for ambulatory infusion of IV iron.  Insurance - UHC Medicare  Site of care - [facility]  Dx code - I50.22  IV Iron Therapy - Feraheme 510 mg IV x 2 Infusion appointments - Scheduling team will schedule patient as soon as possible.    Pierce Barocio D. Delayla Hoffmaster, PharmD

## 2024-03-24 NOTE — Telephone Encounter (Signed)
 Auth Submission: NO AUTH NEEDED Site of care: [facility] Payer: UHC MEDICARE Medication & CPT/J Code(s) submitted: Feraheme (ferumoxytol) [id number] Diagnosis Code: I50.23 Route of submission (phone, fax, portal):  Phone # Fax # Auth type: Buy/Bill HB Units/visits requested: 510mg  x 2 doses Reference number: [id number] Approval from: 03/24/2024 to 05/24/24    Dagoberto Armour, CPhT [facility] Phone: [phone] 03/24/2024

## 2024-03-25 ENCOUNTER — Other Ambulatory Visit (HOSPITAL_COMMUNITY): Payer: Self-pay

## 2024-03-28 NOTE — Progress Notes (Signed)
 Pt called for pre procedure instructions. Arrival time 0900 NPO after midnight explained Instructed to take am meds with sip of water and confirmed blood thinner consistency. Instructed pt need for ride home tomorrow and have responsible adult with them for 24 hrs post procedure.

## 2024-03-29 ENCOUNTER — Encounter (HOSPITAL_COMMUNITY)
Admission: RE | Disposition: A | Discharge status: Home / Self Care | Payer: Self-pay | Source: Home / Self Care | Attending: Cardiology

## 2024-03-29 ENCOUNTER — Ambulatory Visit (HOSPITAL_COMMUNITY): Admitting: Anesthesiology

## 2024-03-29 ENCOUNTER — Ambulatory Visit (HOSPITAL_COMMUNITY)
Admission: RE | Admit: 2024-03-29 | Discharge: 2024-03-29 | Disposition: A | Discharge status: Home / Self Care | Attending: Cardiology | Admitting: Cardiology

## 2024-03-29 ENCOUNTER — Other Ambulatory Visit: Payer: Self-pay

## 2024-03-29 DIAGNOSIS — I2510 Atherosclerotic heart disease of native coronary artery without angina pectoris: Secondary | ICD-10-CM | POA: Diagnosis not present

## 2024-03-29 DIAGNOSIS — G4730 Sleep apnea, unspecified: Secondary | ICD-10-CM | POA: Insufficient documentation

## 2024-03-29 DIAGNOSIS — I110 Hypertensive heart disease with heart failure: Secondary | ICD-10-CM | POA: Insufficient documentation

## 2024-03-29 DIAGNOSIS — R0609 Other forms of dyspnea: Secondary | ICD-10-CM | POA: Insufficient documentation

## 2024-03-29 DIAGNOSIS — Z539 Procedure and treatment not carried out, unspecified reason: Secondary | ICD-10-CM | POA: Insufficient documentation

## 2024-03-29 DIAGNOSIS — I252 Old myocardial infarction: Secondary | ICD-10-CM | POA: Insufficient documentation

## 2024-03-29 DIAGNOSIS — I509 Heart failure, unspecified: Secondary | ICD-10-CM | POA: Insufficient documentation

## 2024-03-29 DIAGNOSIS — E119 Type 2 diabetes mellitus without complications: Secondary | ICD-10-CM | POA: Insufficient documentation

## 2024-03-29 DIAGNOSIS — I5022 Chronic systolic (congestive) heart failure: Secondary | ICD-10-CM

## 2024-03-29 SURGERY — CARDIOVERSION (CATH LAB)
Anesthesia: General

## 2024-03-29 MED ORDER — SODIUM CHLORIDE 0.9 % IV SOLN: INTRAVENOUS | Status: DC

## 2024-03-29 NOTE — Anesthesia Preprocedure Evaluation (Addendum)
 Anesthesia Evaluation  Patient identified by MRN, date of birth, ID band Patient awake    Reviewed: Allergy & Precautions, NPO status , Patient's Chart, lab work & pertinent test results  History of Anesthesia Complications (+) PONV and history of anesthetic complications  Airway Mallampati: II  TM Distance: >3 FB Neck ROM: Full    Dental  (+) Missing,    Pulmonary sleep apnea    Pulmonary exam normal        Cardiovascular hypertension, + CAD, + Past MI, +CHF and + DOE  Normal cardiovascular exam  TTE 02/02/24: EF 20-25%, global hypokinesis, moderate LVH, moderate RV dysfunction, abnormal (paradoxical) septal motion consistent with right ventricular volume overload, moderate MR, mild to moderate TR    Neuro/Psych    GI/Hepatic negative GI ROS, Neg liver ROS,,,  Endo/Other  diabetes, Type 2    Renal/GU ARFRenal disease     Musculoskeletal  (+) Arthritis ,    Abdominal   Peds  Hematology negative hematology ROS (+)   Anesthesia Other Findings   Reproductive/Obstetrics                              Anesthesia Physical Anesthesia Plan  ASA: 4  Anesthesia Plan: General   Post-op Pain Management: Minimal or no pain anticipated   Induction: Intravenous  PONV Risk Score and Plan: 3 and Treatment may vary due to age or medical condition and Propofol  infusion  Airway Management Planned: Mask  Additional Equipment: None  Intra-op Plan:   Post-operative Plan:   Informed Consent: I have reviewed the patients History and Physical, chart, labs and discussed the procedure including the risks, benefits and alternatives for the proposed anesthesia with the patient or authorized representative who has indicated his/her understanding and acceptance.       Plan Discussed with: CRNA  Anesthesia Plan Comments:          Anesthesia Quick Evaluation

## 2024-03-30 NOTE — H&P (Signed)
 Patient noted to be in NSR, case cancelled. Follow up in clinic.

## 2024-04-05 ENCOUNTER — Other Ambulatory Visit: Payer: Self-pay | Admitting: Cardiology

## 2024-04-06 ENCOUNTER — Telehealth (HOSPITAL_COMMUNITY): Payer: Self-pay

## 2024-04-06 ENCOUNTER — Other Ambulatory Visit (HOSPITAL_COMMUNITY): Payer: Self-pay

## 2024-04-06 NOTE — Telephone Encounter (Signed)
 Called to confirm/remind patient of their appointment at the [facility] on 04/07/24.   Appointment:   [x] Confirmed  [] Left mess   [] No answer/No voice mail  [] VM Full/unable to leave message  [] Phone not in service  Patient reminded to bring all medications and/or complete list.  Confirmed patient has transportation. Gave directions, instructed to utilize valet parking.

## 2024-04-07 ENCOUNTER — Ambulatory Visit (HOSPITAL_COMMUNITY)
Admission: RE | Admit: 2024-04-07 | Discharge: 2024-04-07 | Disposition: A | Discharge status: Home / Self Care | Source: Ambulatory Visit | Attending: Family Medicine | Admitting: Family Medicine

## 2024-04-07 ENCOUNTER — Encounter (HOSPITAL_COMMUNITY): Payer: Self-pay | Admitting: Physician Assistant

## 2024-04-07 ENCOUNTER — Ambulatory Visit (HOSPITAL_COMMUNITY): Payer: Self-pay | Admitting: Family Medicine

## 2024-04-07 ENCOUNTER — Telehealth (HOSPITAL_COMMUNITY): Payer: Self-pay

## 2024-04-07 ENCOUNTER — Other Ambulatory Visit (HOSPITAL_COMMUNITY): Payer: Self-pay

## 2024-04-07 ENCOUNTER — Encounter (HOSPITAL_COMMUNITY): Payer: Self-pay

## 2024-04-07 VITALS — BP 130/74 | HR 73 | Wt 187.4 lb

## 2024-04-07 DIAGNOSIS — I252 Old myocardial infarction: Secondary | ICD-10-CM | POA: Insufficient documentation

## 2024-04-07 DIAGNOSIS — Z7902 Long term (current) use of antithrombotics/antiplatelets: Secondary | ICD-10-CM | POA: Insufficient documentation

## 2024-04-07 DIAGNOSIS — Z79899 Other long term (current) drug therapy: Secondary | ICD-10-CM | POA: Diagnosis not present

## 2024-04-07 DIAGNOSIS — I4892 Unspecified atrial flutter: Secondary | ICD-10-CM | POA: Insufficient documentation

## 2024-04-07 DIAGNOSIS — I5022 Chronic systolic (congestive) heart failure: Secondary | ICD-10-CM | POA: Diagnosis present

## 2024-04-07 DIAGNOSIS — S4990XA Unspecified injury of shoulder and upper arm, unspecified arm, initial encounter: Secondary | ICD-10-CM | POA: Diagnosis not present

## 2024-04-07 DIAGNOSIS — Z7901 Long term (current) use of anticoagulants: Secondary | ICD-10-CM | POA: Diagnosis not present

## 2024-04-07 DIAGNOSIS — Z955 Presence of coronary angioplasty implant and graft: Secondary | ICD-10-CM | POA: Insufficient documentation

## 2024-04-07 DIAGNOSIS — I255 Ischemic cardiomyopathy: Secondary | ICD-10-CM | POA: Insufficient documentation

## 2024-04-07 DIAGNOSIS — I110 Hypertensive heart disease with heart failure: Secondary | ICD-10-CM | POA: Diagnosis not present

## 2024-04-07 DIAGNOSIS — Z8719 Personal history of other diseases of the digestive system: Secondary | ICD-10-CM | POA: Diagnosis not present

## 2024-04-07 DIAGNOSIS — X58XXXA Exposure to other specified factors, initial encounter: Secondary | ICD-10-CM | POA: Diagnosis not present

## 2024-04-07 DIAGNOSIS — D509 Iron deficiency anemia, unspecified: Secondary | ICD-10-CM | POA: Diagnosis not present

## 2024-04-07 DIAGNOSIS — Z86718 Personal history of other venous thrombosis and embolism: Secondary | ICD-10-CM | POA: Diagnosis not present

## 2024-04-07 DIAGNOSIS — I480 Paroxysmal atrial fibrillation: Secondary | ICD-10-CM | POA: Diagnosis not present

## 2024-04-07 DIAGNOSIS — Z7984 Long term (current) use of oral hypoglycemic drugs: Secondary | ICD-10-CM | POA: Insufficient documentation

## 2024-04-07 DIAGNOSIS — I2510 Atherosclerotic heart disease of native coronary artery without angina pectoris: Secondary | ICD-10-CM | POA: Diagnosis not present

## 2024-04-07 LAB — BASIC METABOLIC PANEL WITH GFR
Anion gap: 10 (ref 5–15)
BUN: 12 mg/dL (ref 8–23)
CO2: 27 mmol/L (ref 22–32)
Calcium: 9 mg/dL (ref 8.9–10.3)
Chloride: 106 mmol/L (ref 98–111)
Creatinine, Ser: 1.14 mg/dL (ref 0.61–1.24)
GFR, Estimated: >60 mL/min (ref >60)
Glucose, Bld: 82 mg/dL (ref 70–99)
Potassium: 4.2 mmol/L (ref 3.5–5.1)
Sodium: 143 mmol/L (ref 135–145)

## 2024-04-07 LAB — DIGOXIN LEVEL: Digoxin Level: 0.7 ng/mL — ABNORMAL LOW (ref 0.8–2.0)

## 2024-04-07 LAB — BRAIN NATRIURETIC PEPTIDE: B Natriuretic Peptide: 940.4 pg/mL — ABNORMAL HIGH (ref 0.0–100.0)

## 2024-04-07 MED ORDER — AMIODARONE HCL 200 MG PO TABS: 200.0000 mg | ORAL_TABLET | Freq: Every day | ORAL | 0 refills | Status: DC

## 2024-04-07 MED ORDER — FUROSEMIDE 40 MG PO TABS: 40.0000 mg | ORAL_TABLET | Freq: Two times a day (BID) | ORAL | 3 refills | Status: DC

## 2024-04-07 MED ORDER — SACUBITRIL-VALSARTAN 24-26 MG PO TABS: 1.0000 | ORAL_TABLET | Freq: Two times a day (BID) | ORAL | 2 refills | Status: DC

## 2024-04-07 MED ORDER — POTASSIUM CHLORIDE CRYS ER 20 MEQ PO TBCR: 20.0000 meq | EXTENDED_RELEASE_TABLET | Freq: Two times a day (BID) | ORAL | 3 refills | Status: DC

## 2024-04-07 NOTE — Addendum Note (Signed)
 Encounter addended by: Delana Manganello M, RN on: 04/07/2024 2:57 PM  Actions taken: Order list changed

## 2024-04-07 NOTE — Patient Instructions (Addendum)
 Good to see you today!   STOP losartan  START Entresto  24/26 mg Twice daily  DECREASE Amiodarone  to 200 mg daily  INCREASE lasix  to 40 mg Twice daily  INCREASE potassium to 20 meq Twice daily  Labs done today, your results will be available in MyChart, we will contact you for abnormal readings.  Your physician recommends that you schedule a follow-up appointment as scheduled  You have been referred to CARDIAC REHAB  If you have any questions or concerns before your next appointment please send us  a message through Julian or call our office at [phone].    TO LEAVE A MESSAGE FOR THE NURSE SELECT OPTION 2, PLEASE LEAVE A MESSAGE INCLUDING: YOUR NAME DATE OF BIRTH CALL BACK NUMBER REASON FOR CALL**this is important as we prioritize the call backs  YOU WILL RECEIVE A CALL BACK THE SAME DAY AS LONG AS YOU CALL BEFORE 4:00 PM At the [facility], you and your health needs are our priority. As part of our continuing mission to provide you with exceptional heart care, we have created designated Provider Care Teams. These Care Teams include your primary Cardiologist (physician) and Advanced Practice Providers (APPs- Physician Assistants and Nurse Practitioners) who all work together to provide you with the care you need, when you need it.   You may see any of the following providers on your designated Care Team at your next follow up: Dr Toribio Fuel Dr Ezra Shuck Dr. Morene Brownie [name], NP Caffie Shed, [place], [place] Beckey Coe, NP Jordan Lee, NP Ellouise Class, NP Tinnie Redman, PharmD Jaun Bash, PharmD   Please be sure to bring in all your medications bottles to every appointment.    Thank you for choosing [facility]

## 2024-04-07 NOTE — Addendum Note (Signed)
 Encounter addended by: Mona Ayars M, RN on: 04/07/2024 12:50 PM  Actions taken: Order list changed, Clinical Note Signed

## 2024-04-07 NOTE — Telephone Encounter (Signed)
 Advanced Heart Failure Patient Advocate Encounter  The patient was approved for a [employer] grant that will help cover the cost of Digoxin , Eliquis , Entresto , Spironolactone .  Total amount awarded, $7,500.  Effective: 03/08/2024 - 03/07/2025.  BIN [id number] PCN [id number] Group [id number] ID [id number]  Pharmacy provided with approval and processing information. Confirmed $0 copay for Entresto . Patient informed via voicemail.  Rachel DEL, CPhT Rx Patient Advocate Phone: [phone]

## 2024-04-07 NOTE — Addendum Note (Signed)
 Encounter addended by: Jakarius Flamenco M, RN on: 04/07/2024 12:47 PM  Actions taken: Medication long-term status modified, Order list changed, Clinical Note Signed

## 2024-04-07 NOTE — Progress Notes (Signed)
 ReDS Vest / Clip - 04/07/24 1200       ReDS Vest / Clip   Station Marker C    Ruler Value 24.5    ReDS Value Range High volume overload    ReDS Actual Value 44

## 2024-04-07 NOTE — Progress Notes (Signed)
 ADVANCED HF CLINIC NOTE  Primary Care: Wonda Worth S[facility], [place] Primary Cardiologist: Alm Clay, MD HF Cardiologist: Dr. Zenaida  HPI: Chase Combs is a 78 y.o. 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   Admitted 8/25 with NSTEMI s/p DES to LAD. Echo 8/25 EF 20-25% (previously 40-45% 2/25), mod RV reduced. Unfortunately readmitted later that month with GI bleed and intestinal pneumatosis/ileus/pseudoobstruction w/ suuspected septic shock. Underwent repeat cardiac cath 02/01/24 showing LAD stent thrombosis that was ballooned with difficulty with good final result. Was noted to be in cardiogenic shock and was started on milrinone  breifly and diuresed. Developed AFL with RVR and underwent TEE/DCCV 02/21/24. Echo was repeated minimal change. Course complicated by deconditioning and orthostatic hypotension. Had discharge wt of 75.8 kg.   Follow up 10/25, he was in AFL with controlled VR and volume overloaded. Diuretics adjusted and arranged for DCCV. At scheduled DCCV he was in NSR, procedure cancelled.   Today he returns for HF follow up with his wife. He has SOB walking short distances on flat ground and feels fatigued.  Denies palpitations, abnormal bleeding, CP, dizziness, edema, or PND/Orthopnea. Chronically sleeps reclined due to shoulder injury. Appetite ok. Weight at home 179 pounds. Taking all medications. Asking when he can stop medications. Finished HH PT.   Past Medical History:  Diagnosis Date   Abdominal hernia    per pt   Arthritis    hands   Benign localized prostatic hyperplasia with lower urinary tract symptoms (LUTS)    CAD (coronary artery disease) 05/2004   cardiologist--- dr clay;  positive stress test , had outpt cardiac cath 05-27-2004 stenosis RI;  06-18-2004 cath w/ PCI and BMS x1 to pRI;   STEMI 02-16-2009  s/p cath w/ PCI thrombectomy for total occluded RCA , BMS x1 to pRCA;  nuclear  stress test 03-15-2012 low risk no ischemia but sig inferior-inferolateral infarct , ef 51%   Dyslipidemia    Essential hypertension    Heart murmur    History of COVID-19 05/2020   per pt mild symptoms that resolved   History of ST elevation myocardial infarction (STEMI) 02/16/2009   inferior STEMI   cath w/ pci w/ BMS to pRCA   History of urinary retention    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   urologist--- dr borden/ radiation oncology-- dr patrcia;  dx 12/ 2022,  Gleason 4+5, PSA 24.6, volume 94.6cc;  plan to start IMRT   OSA (obstructive sleep apnea)    per pt dx approx 2008, no cpap intolerant   PONV (postoperative nausea and vomiting)    Pre-diabetes    S/p bare metal coronary artery stent    01/ 2006  x1 BMS to pRI (CoStar study stent);  and 09/ 2010  x1 BMS to pRCA   Status post total knee replacement, 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 60 tablet 0   apixaban  (ELIQUIS ) 5 MG TABS tablet Take 1 tablet (5 mg total) by mouth 2 (two) times daily. 60 tablet 5   CALCIUM  PO Take 500 mg by mouth daily.     clopidogrel  (PLAVIX ) 75 MG tablet Take 1 tablet (75 mg total) by mouth daily. 30 tablet 5   cyanocobalamin  (VITAMIN B12) 1000 MCG tablet Take 1,000  mcg by mouth daily.     digoxin  (LANOXIN ) 0.125 MG tablet Take 1/2 tablet (0.0625 mg total) by mouth daily. 15 tablet 5   empagliflozin  (JARDIANCE ) 10 MG TABS tablet Take 1 tablet (10 mg total) by mouth daily before breakfast. 30 tablet 6   furosemide  (LASIX ) 40 MG tablet Take 1 tablet (40 mg total) by mouth daily. 90 tablet 3   losartan (COZAAR) 25 MG tablet Take 1 tablet (25 mg total) by mouth daily. 90 tablet 3   Multiple Vitamins-Minerals (MULTIVITAMIN MEN 50+) TABS Take 1 tablet by mouth daily.     nitroGLYCERIN  (NITROSTAT ) 0.4 MG SL tablet Place 0.4 mg under the tongue every 5  (five) minutes as needed for chest pain.     ondansetron  (ZOFRAN ) 8 MG tablet Take 8 mg by mouth every 8 (eight) hours as needed for nausea or vomiting.     pantoprazole  (PROTONIX ) 40 MG tablet Take 1 tablet (40 mg total) by mouth daily. 30 tablet 0   potassium chloride  SA (KLOR-CON  M) 20 MEQ tablet Take 1 tablet (20 mEq total) by mouth daily. 90 tablet 3   Probiotic Product (PROBIOTIC PO) Take 1,000 mg by mouth daily. Chew     rosuvastatin  (CRESTOR ) 20 MG tablet TAKE 1 TABLET(20 MG) BY MOUTH DAILY 30 tablet 11   sertraline (ZOLOFT) 50 MG tablet Take 25-50 mg by mouth See admin instructions. Taking 25 mg for 7 day and 50 mg daily after     spironolactone  (ALDACTONE ) 25 MG tablet Take 0.5 tablets (12.5 mg total) by mouth daily. 45 tablet 3   tamsulosin  (FLOMAX ) 0.4 MG CAPS capsule Take 1 capsule (0.4 mg total) by mouth daily after supper. 30 capsule 5   No current facility-administered medications for this encounter.   Allergies  Allergen Reactions   Codeine Nausea And Vomiting   Latex Rash    Oral blisters when used at dentist    Social History   Socioeconomic History   Marital status: Married    Spouse name: [name]   Number of children: 3   Years of education: Not on file   Highest education level: Not on file  Occupational History   Not on file  Tobacco Use   Smoking status: Never   Smokeless tobacco: Never  Vaping Use   Vaping status: Never Used  Substance and Sexual Activity   Alcohol use: No    Alcohol/week: 0.0 standard drinks of alcohol   Drug use: Never   Sexual activity: Not on file  Other Topics Concern   Not on file  Social History Narrative   He is married, father of 3, grandfather of 5. Never smoked. Does not drink.    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    He does lots of lifting, pulling with ropes, using upper extremities.       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   Social Drivers of Corporate Investment Banker Strain: Not on file  Food Insecurity: No Food Insecurity (02/06/2024)   Hunger Vital Sign    Worried About Running Out of Food in the Last Year: Never true    Ran Out of Food in the Last Year: Never true  Transportation Needs: No Transportation Needs (02/06/2024)  PRAPARE - Administrator, Civil Service (Medical): No    Lack of Transportation (Non-Medical): No  Physical Activity: Not on file  Stress: Not on file  Social Connections: Moderately Integrated (02/06/2024)   Social Connection and Isolation Panel    Frequency of Communication with Friends and Family: Twice a week    Frequency of Social Gatherings with Friends and Family: Once a week    Attends Religious Services: 1 to 4 times per year    Active Member of [employer] or Organizations: No    Attends Banker Meetings: Never    Marital Status: Married  Catering Manager Violence: Not At Risk (02/06/2024)   Humiliation, Afraid, Rape, and Kick questionnaire    Fear of Current or Ex-Partner: No    Emotionally Abused: No    Physically Abused: No    Sexually Abused: No   Family History  Problem Relation Age of Onset   Lung cancer Mother    Colon cancer Neg Hx    Esophageal cancer Neg Hx    Blood pressure 130/74, pulse 73, weight 85 kg (187 lb 6.4 oz), SpO2 96%.  Wt Readings from Last 3 Encounters:  04/07/24 85 kg (187 lb 6.4 oz)  03/29/24 82.6 kg (182 lb)  03/23/24 89 kg (196 lb 3.2 oz)   BP 130/74   Pulse 73   Wt 85 kg (187 lb 6.4 oz)   SpO2 96%   BMI 25.42 kg/m   PHYSICAL EXAM: General:  NAD. No resp difficulty, walked into clinic, elderly HEENT: Normal Neck: Supple. JVP to jaw Cor: Regular rate & rhythm. No rubs, gallops or murmurs. Lungs: Clear Abdomen: Soft, nontender, +distended.  Extremities: No cyanosis, clubbing, rash,  edema Neuro: Alert & oriented x 3, moves all 4 extremities w/o difficulty. Affect pleasant.  ReDs reading: 44 %, abnormal  ECG (personally reviewed): NSR LBBB, QRS 148 msec  ASSESSMENT & PLAN:  Chronic HFrEF, ICM   - Echo 8/25 and 9/25 EF  20-25% RV moderately reduced.      - Recent admission 9/25 requiring milrinone .  - NYHA III today. Volume overloaded on exam and by ReDS 44%. Weight up 20 lb.  - Increase Lasix  to 40 mg bid, increase KCL to 20 bid - Stop losartan - Start Entresto  24/26 mg bid - Continue jardiance  10 mg daily - Continue digoxin  0.0625 mg daily  - Continue spiro 12.5 mg daily - off beta blocker with recent low output & volume overload - Refer to Cardiac Rehab - Labs today - Hesitant to add or change meds, will try to simplify med regimen as able   Atrial flutter - new diagnosis during recent admit - s/p successful DCCV 10/25 - NSR on ECG today - Decrease amiodarone  to 200 mg daily - Continue Eliquis  5 mg bid. No bleeding issues  CAD, STEMI d/t stent thrombosis - s/p DES to LAD 8/25 - Remote hx of PCI to RI in 2006 and RCA in 2010 - Plavix  Genetic Testing completed, rapid metabolizer; continue plavix   - No chest pain - off aspirin  with GI bleed.  - Continue crestor  20 mg daily   Iron deficiency anemia - recent GIB; off ASA - EGD and flex sig 9/25 with esophageal ulcer w/o bleeding, red blood in rectum and throuhgout colon, and few diverticula. No obvious source of bleeding found. Per GI possibly diverticular. - continue protonix  - suspect component of Fe def anemia 2/2 chronic disease - Recent iron stores were low with Tsat 14%.  -  IV iron arranged for next week.  Follow up in 2-3 weeks with APP for fluid check.   Harlene CHRISTELLA Gainer, FNP 04/07/24

## 2024-04-10 ENCOUNTER — Encounter (HOSPITAL_COMMUNITY)
Admission: RE | Admit: 2024-04-10 | Discharge: 2024-04-10 | Disposition: A | Discharge status: Home / Self Care | Source: Ambulatory Visit | Attending: Physician Assistant | Admitting: Physician Assistant

## 2024-04-10 VITALS — BP 143/127 | HR 50 | Temp 97.7°F | Resp 17

## 2024-04-10 DIAGNOSIS — I5023 Acute on chronic systolic (congestive) heart failure: Secondary | ICD-10-CM | POA: Insufficient documentation

## 2024-04-10 MED ORDER — SODIUM CHLORIDE 0.9 % IV SOLN
510.0000 mg | Freq: Once | INTRAVENOUS | Status: AC
  Administered 2024-04-10: 510 mg via INTRAVENOUS
  Filled 2024-04-10: qty 510

## 2024-04-17 ENCOUNTER — Telehealth (HOSPITAL_COMMUNITY): Payer: Self-pay

## 2024-04-17 ENCOUNTER — Encounter (HOSPITAL_COMMUNITY)
Admission: RE | Admit: 2024-04-17 | Discharge: 2024-04-17 | Disposition: A | Discharge status: Home / Self Care | Source: Ambulatory Visit | Attending: Physician Assistant | Admitting: Physician Assistant

## 2024-04-17 VITALS — BP 136/65 | HR 70 | Temp 98.0°F | Resp 17

## 2024-04-17 DIAGNOSIS — I5023 Acute on chronic systolic (congestive) heart failure: Secondary | ICD-10-CM | POA: Diagnosis not present

## 2024-04-17 MED ORDER — SODIUM CHLORIDE 0.9 % IV SOLN
510.0000 mg | Freq: Once | INTRAVENOUS | Status: AC
  Administered 2024-04-17: 510 mg via INTRAVENOUS
  Filled 2024-04-17: qty 510

## 2024-04-17 NOTE — Telephone Encounter (Signed)
 Pt is indecisive about the cardiac rehab program per note and will call if anything changes.   Closed referral

## 2024-04-17 NOTE — Telephone Encounter (Signed)
 N/a

## 2024-04-18 ENCOUNTER — Telehealth (HOSPITAL_COMMUNITY): Payer: Self-pay

## 2024-04-18 NOTE — Telephone Encounter (Signed)
 Per previous note pt will call if he wants to participate in cardiac rehab.  Closed referral.

## 2024-04-20 LAB — ECHO TEE

## 2024-04-21 ENCOUNTER — Encounter (HOSPITAL_BASED_OUTPATIENT_CLINIC_OR_DEPARTMENT_OTHER): Payer: Self-pay | Admitting: Emergency Medicine

## 2024-04-21 ENCOUNTER — Emergency Department (HOSPITAL_BASED_OUTPATIENT_CLINIC_OR_DEPARTMENT_OTHER): Admitting: Radiology

## 2024-04-21 ENCOUNTER — Emergency Department (HOSPITAL_BASED_OUTPATIENT_CLINIC_OR_DEPARTMENT_OTHER)

## 2024-04-21 ENCOUNTER — Other Ambulatory Visit: Payer: Self-pay

## 2024-04-21 ENCOUNTER — Inpatient Hospital Stay (HOSPITAL_BASED_OUTPATIENT_CLINIC_OR_DEPARTMENT_OTHER)
Admission: EM | Admit: 2024-04-21 | Discharge: 2024-05-11 | DRG: 682 | Disposition: A | Discharge status: Skilled Nursing Facility | Attending: Internal Medicine | Admitting: Internal Medicine

## 2024-04-21 DIAGNOSIS — I959 Hypotension, unspecified: Secondary | ICD-10-CM | POA: Diagnosis present

## 2024-04-21 DIAGNOSIS — K560 Paralytic ileus: Secondary | ICD-10-CM

## 2024-04-21 DIAGNOSIS — R7989 Other specified abnormal findings of blood chemistry: Secondary | ICD-10-CM | POA: Diagnosis present

## 2024-04-21 DIAGNOSIS — E876 Hypokalemia: Secondary | ICD-10-CM | POA: Diagnosis present

## 2024-04-21 DIAGNOSIS — D638 Anemia in other chronic diseases classified elsewhere: Secondary | ICD-10-CM | POA: Diagnosis present

## 2024-04-21 DIAGNOSIS — N179 Acute kidney failure, unspecified: Principal | ICD-10-CM | POA: Diagnosis present

## 2024-04-21 DIAGNOSIS — R42 Dizziness and giddiness: Secondary | ICD-10-CM

## 2024-04-21 DIAGNOSIS — K529 Noninfective gastroenteritis and colitis, unspecified: Secondary | ICD-10-CM | POA: Diagnosis present

## 2024-04-21 DIAGNOSIS — K5981 Ogilvie syndrome: Secondary | ICD-10-CM | POA: Diagnosis present

## 2024-04-21 DIAGNOSIS — W19XXXA Unspecified fall, initial encounter: Secondary | ICD-10-CM

## 2024-04-21 DIAGNOSIS — I4892 Unspecified atrial flutter: Secondary | ICD-10-CM | POA: Diagnosis present

## 2024-04-21 DIAGNOSIS — I5022 Chronic systolic (congestive) heart failure: Secondary | ICD-10-CM | POA: Diagnosis present

## 2024-04-21 DIAGNOSIS — I5042 Chronic combined systolic (congestive) and diastolic (congestive) heart failure: Secondary | ICD-10-CM

## 2024-04-21 DIAGNOSIS — K567 Ileus, unspecified: Secondary | ICD-10-CM | POA: Diagnosis present

## 2024-04-21 DIAGNOSIS — E785 Hyperlipidemia, unspecified: Secondary | ICD-10-CM | POA: Diagnosis present

## 2024-04-21 DIAGNOSIS — E119 Type 2 diabetes mellitus without complications: Secondary | ICD-10-CM

## 2024-04-21 LAB — MAGNESIUM: Magnesium: 1.8 mg/dL (ref 1.7–2.4)

## 2024-04-21 LAB — RESP PANEL BY RT-PCR (RSV, FLU A&B, COVID)  RVPGX2
Influenza A by PCR: NEGATIVE
Influenza B by PCR: NEGATIVE
Resp Syncytial Virus by PCR: NEGATIVE
SARS Coronavirus 2 by RT PCR: NEGATIVE

## 2024-04-21 LAB — CBC
HCT: 38.4 % — ABNORMAL LOW (ref 39.0–52.0)
Hemoglobin: 12.7 g/dL — ABNORMAL LOW (ref 13.0–17.0)
MCH: 30.3 pg (ref 26.0–34.0)
MCHC: 33.1 g/dL (ref 30.0–36.0)
MCV: 91.6 fL (ref 80.0–100.0)
Platelets: 364 K/uL (ref 150–400)
RBC: 4.19 MIL/uL — ABNORMAL LOW (ref 4.22–5.81)
RDW: 19.2 % — ABNORMAL HIGH (ref 11.5–15.5)
WBC: 3.3 K/uL — ABNORMAL LOW (ref 4.0–10.5)
nRBC: 0 % (ref 0.0–0.2)

## 2024-04-21 LAB — COMPREHENSIVE METABOLIC PANEL WITH GFR
ALT: 20 U/L (ref 0–44)
AST: 22 U/L (ref 15–41)
Albumin: 4.1 g/dL (ref 3.5–5.0)
Alkaline Phosphatase: 86 U/L (ref 38–126)
Anion gap: 19 — ABNORMAL HIGH (ref 5–15)
BUN: 38 mg/dL — ABNORMAL HIGH (ref 8–23)
CO2: 22 mmol/L (ref 22–32)
Calcium: 9.9 mg/dL (ref 8.9–10.3)
Chloride: 93 mmol/L — ABNORMAL LOW (ref 98–111)
Creatinine, Ser: 1.65 mg/dL — ABNORMAL HIGH (ref 0.61–1.24)
GFR, Estimated: 43 mL/min — ABNORMAL LOW (ref >60)
Glucose, Bld: 132 mg/dL — ABNORMAL HIGH (ref 70–99)
Potassium: 3 mmol/L — ABNORMAL LOW (ref 3.5–5.1)
Sodium: 134 mmol/L — ABNORMAL LOW (ref 135–145)
Total Bilirubin: 0.8 mg/dL (ref 0.0–1.2)
Total Protein: 7.3 g/dL (ref 6.5–8.1)

## 2024-04-21 LAB — LACTIC ACID, PLASMA: Lactic Acid, Venous: 1.2 mmol/L (ref 0.5–1.9)

## 2024-04-21 LAB — PROTIME-INR
INR: 1.7 — ABNORMAL HIGH (ref 0.8–1.2)
Prothrombin Time: 20.9 s — ABNORMAL HIGH (ref 11.4–15.2)

## 2024-04-21 LAB — CBG MONITORING, ED: Glucose-Capillary: 142 mg/dL — ABNORMAL HIGH (ref 70–99)

## 2024-04-21 LAB — TROPONIN T, HIGH SENSITIVITY
Troponin T High Sensitivity: 71 ng/L — ABNORMAL HIGH (ref 0–19)
Troponin T High Sensitivity: 67 ng/L — ABNORMAL HIGH (ref 0–19)

## 2024-04-21 MED ORDER — METOCLOPRAMIDE HCL 5 MG/ML IJ SOLN
5.0000 mg | Freq: Once | INTRAMUSCULAR | Status: AC
  Administered 2024-04-21: 5 mg via INTRAVENOUS
  Filled 2024-04-21: qty 2

## 2024-04-21 MED ORDER — ACETAMINOPHEN 325 MG PO TABS
650.0000 mg | ORAL_TABLET | Freq: Once | ORAL | Status: AC
  Administered 2024-04-21: 650 mg via ORAL
  Filled 2024-04-21: qty 2

## 2024-04-21 MED ORDER — LACTATED RINGERS IV BOLUS
500.0000 mL | Freq: Once | INTRAVENOUS | Status: AC
  Administered 2024-04-21: 500 mL via INTRAVENOUS

## 2024-04-21 MED ORDER — LACTATED RINGERS IV SOLN: INTRAVENOUS | Status: DC

## 2024-04-21 MED ORDER — POTASSIUM CHLORIDE CRYS ER 20 MEQ PO TBCR
40.0000 meq | EXTENDED_RELEASE_TABLET | Freq: Once | ORAL | Status: AC
  Administered 2024-04-21: 40 meq via ORAL
  Filled 2024-04-21: qty 2

## 2024-04-21 NOTE — ED Triage Notes (Signed)
 Pt via pov from home after a fall today. He was feeling ill yesterday with nausea and vomiting; today felt weak. He had a near syncopal episode and fell, hitting his right ear on the door jamb. He feels dizzy today. Takes eliquis . Pt a&o x4, seems sleepy during triage

## 2024-04-21 NOTE — ED Provider Notes (Incomplete)
  Physical Exam  BP (!) 87/52   Pulse 80   Temp 97.7 F (36.5 C) (Oral)   Resp 17   Ht 6' (1.829 m)   Wt 85 kg   SpO2 95%   BMI 25.41 kg/m   Physical Exam  Procedures  Procedures  ED Course / MDM   Clinical Course as of 04/21/24 2324  Fri Apr 21, 2024  2239 I discussed with Dr. Alfornia.  She is asking for a lactic acid prior to bed request as she is concerned he may need a higher level of care.  Current blood pressure 96/52.  He does not seem altered.  Will add a lactate and then message her with the results. [SG]    Clinical Course User Index [SG] Freddi Hamilton, MD   Medical Decision Making Amount and/or Complexity of Data Reviewed Labs: ordered. Radiology: ordered.  Risk OTC drugs. Prescription drug management. Decision regarding hospitalization.   50M hx of CHF presenting with nausea, vomiting, and diarrhea, hypotensive, lactic normal, BP better after 500cc IVF, waiting on hospitalist to admit.

## 2024-04-21 NOTE — ED Notes (Signed)
 Pt placed on 2lt [place] due to SATS in the 80s.

## 2024-04-21 NOTE — ED Notes (Signed)
 Pt assessed, 96% room air, SOB noted, bilateral breath sounds, fine crackles > RLL vs. LLL. Productive cough, thick, white, clear, moderate amounts of secretions.

## 2024-04-21 NOTE — ED Provider Notes (Addendum)
 [place] [facility]ERGENCY DEPARTMENT AT [facility] Provider Note   CSN: [id number] Arrival date & time: 04/21/24  8177     Patient presents with: Chase Combs is a 78 y.o. male.   HPI 77 year old male with a history of systolic CHF, hypertension, CAD, A-fib, and multiple other comorbidities presents with dizziness and fall.  He has been vomiting and having diarrhea since yesterday.  He has been overall feeling poorly and dizzy when standing.  Today he went to go to the bathroom and when he stood up was dizzy and then he fell due to the dizziness and hit the back of his ear.  He does not think he fully lost consciousness.  He denies any chest pain.  No fevers or cough, shortness of breath, or abdominal pain.  He has not been having any recent leg swelling.  He is on Eliquis  for paroxysmal A-fib.  Prior to Admission medications   Medication Sig Start Date End Date Taking? Authorizing Provider  amiodarone  (PACERONE ) 200 MG tablet Take 1 tablet (200 mg total) by mouth daily. 04/07/24   [place], Harlene HERO, FNP  apixaban  (ELIQUIS ) 5 MG TABS tablet Take 1 tablet (5 mg total) by mouth 2 (two) times daily. 03/23/24   Colletta Manuelita Garre, PA-C  CALCIUM  PO Take 500 mg by mouth daily.    [provider]  clopidogrel  (PLAVIX ) 75 MG tablet Take 1 tablet (75 mg total) by mouth daily. 03/23/24   Colletta Manuelita Garre, PA-C  cyanocobalamin  (VITAMIN B12) 1000 MCG tablet Take 1,000 mcg by mouth daily.    [provider]  digoxin  (LANOXIN ) 0.125 MG tablet Take 1/2 tablet (0.0625 mg total) by mouth daily. 03/23/24   Colletta Manuelita Garre, PA-C  empagliflozin  (JARDIANCE ) 10 MG TABS tablet Take 1 tablet (10 mg total) by mouth daily before breakfast. 03/10/24   Lee, Jordan, NP  furosemide  (LASIX ) 40 MG tablet Take 1 tablet (40 mg total) by mouth 2 (two) times daily. 04/07/24 07/06/24  Glena Harlene HERO, FNP  Multiple Vitamins-Minerals (MULTIVITAMIN MEN 50+) TABS Take 1 tablet by  mouth daily.    [provider]  nitroGLYCERIN  (NITROSTAT ) 0.4 MG SL tablet Place 0.4 mg under the tongue every 5 (five) minutes as needed for chest pain. 01/19/24   [provider]  ondansetron  (ZOFRAN ) 8 MG tablet Take 8 mg by mouth every 8 (eight) hours as needed for nausea or vomiting. 02/29/24   [provider]  pantoprazole  (PROTONIX ) 40 MG tablet Take 1 tablet (40 mg total) by mouth daily. 02/24/24   Arrien, Elidia Sieving, MD  potassium chloride  SA (KLOR-CON  M) 20 MEQ tablet Take 1 tablet (20 mEq total) by mouth 2 (two) times daily. 04/07/24   Glena Harlene HERO, FNP  Probiotic Product (PROBIOTIC PO) Take 1,000 mg by mouth daily. Chew    [provider]  rosuvastatin  (CRESTOR ) 20 MG tablet TAKE 1 TABLET(20 MG) BY MOUTH DAILY 11/29/23   Anner Alm ORN, MD  sacubitril -valsartan  (ENTRESTO ) 24-26 MG Take 1 tablet by mouth 2 (two) times daily. 04/07/24   Glena Harlene HERO, FNP  sertraline (ZOLOFT) 50 MG tablet Take 25-50 mg by mouth See admin instructions. Taking 25 mg for 7 day and 50 mg daily after    [provider]  spironolactone  (ALDACTONE ) 25 MG tablet Take 0.5 tablets (12.5 mg total) by mouth daily. 03/14/24 06/12/24  Glena Harlene HERO, FNP  tamsulosin  (FLOMAX ) 0.4 MG CAPS capsule Take 1 capsule (0.4 mg total) by mouth  daily after supper. 11/17/21   Patrcia Cough, MD    Allergies: Codeine and Latex    Review of Systems  Respiratory:  Negative for cough and shortness of breath.   Cardiovascular:  Negative for chest pain and leg swelling.  Gastrointestinal:  Positive for diarrhea and vomiting. Negative for abdominal pain and nausea.  Musculoskeletal:  Positive for neck pain.  Neurological:  Positive for weakness. Negative for headaches.    Updated Vital Signs BP (!) 87/52   Pulse 80   Temp 97.7 F (36.5 C) (Oral)   Resp 17   Ht 6' (1.829 m)   Wt 85 kg   SpO2 95%   BMI 25.41 kg/m   Physical Exam Vitals and nursing note  reviewed.  Constitutional:      General: He is not in acute distress.    Appearance: He is well-developed. He is not diaphoretic.  HENT:     Head: Normocephalic.     Comments: Small abrasion to the posterior left ear, no acute bleeding Cardiovascular:     Rate and Rhythm: Normal rate and regular rhythm.     Heart sounds: Normal heart sounds.  Pulmonary:     Effort: Pulmonary effort is normal.     Breath sounds: Normal breath sounds.  Abdominal:     General: There is distension.     Palpations: Abdomen is soft.     Tenderness: There is no abdominal tenderness.  Musculoskeletal:     Right lower leg: No edema.     Left lower leg: No edema.  Skin:    General: Skin is warm and dry.  Neurological:     Mental Status: He is alert.     (all labs ordered are listed, but only abnormal results are displayed) Labs Reviewed  COMPREHENSIVE METABOLIC PANEL WITH GFR - Abnormal; Notable for the following components:      Result Value   Sodium 134 (*)    Potassium 3.0 (*)    Chloride 93 (*)    Glucose, Bld 132 (*)    BUN 38 (*)    Creatinine, Ser 1.65 (*)    GFR, Estimated 43 (*)    Anion gap 19 (*)    All other components within normal limits  CBC - Abnormal; Notable for the following components:   WBC 3.3 (*)    RBC 4.19 (*)    Hemoglobin 12.7 (*)    HCT 38.4 (*)    RDW 19.2 (*)    All other components within normal limits  PROTIME-INR - Abnormal; Notable for the following components:   Prothrombin Time 20.9 (*)    INR 1.7 (*)    All other components within normal limits  CBG MONITORING, ED - Abnormal; Notable for the following components:   Glucose-Capillary 142 (*)    All other components within normal limits  TROPONIN T, HIGH SENSITIVITY - Abnormal; Notable for the following components:   Troponin T High Sensitivity 71 (*)    All other components within normal limits  TROPONIN T, HIGH SENSITIVITY - Abnormal; Notable for the following components:   Troponin T High  Sensitivity 67 (*)    All other components within normal limits  RESP PANEL BY RT-PCR (RSV, FLU A&B, COVID)  RVPGX2  MAGNESIUM   LACTIC ACID, PLASMA  URINALYSIS, ROUTINE W REFLEX MICROSCOPIC    EKG: EKG Interpretation Date/Time:  Friday April 21 2024 20:12:33 EST Ventricular Rate:  85 PR Interval:  158 QRS Duration:  217 QT Interval:  459 QTC  Calculation: 546 R Axis:   -85  Text Interpretation: Sinus rhythm RBBB and LAFB  artifact limit interpretation, otherwise appears stable compared to Apr 07 2024 Confirmed by Freddi Hamilton [phone]) on 04/21/2024 8:18:11 PM  Radiology: CT ABDOMEN PELVIS WO CONTRAST Result Date: 04/21/2024 CLINICAL DATA:  Bowel obstruction suspected. EXAM: CT ABDOMEN AND PELVIS WITHOUT CONTRAST TECHNIQUE: Multidetector CT imaging of the abdomen and pelvis was performed following the standard protocol without IV contrast. RADIATION DOSE REDUCTION: This exam was performed according to the departmental dose-optimization program which includes automated exposure control, adjustment of the mA and/or kV according to patient size and/or use of iterative reconstruction technique. COMPARISON:  CT abdomen and pelvis 02/14/2024. FINDINGS: Lower chest: No acute abnormality. Hepatobiliary: No focal liver abnormality is seen. No gallstones, gallbladder wall thickening, or biliary dilatation. Pancreas: Unremarkable. No pancreatic ductal dilatation or surrounding inflammatory changes. Spleen: Normal in size without focal abnormality. Adrenals/Urinary Tract: Low-density right adrenal nodule measures 12 Hounsfield units and appears unchanged (10 mm). The left adrenal gland is within normal limits. There is a single 3 mm calculus in the right kidney. Bilateral renal cysts appear unchanged. Subcentimeter rounded hyperdensity in the left cyst is also unchanged, possibly a complex cyst. There is no hydronephrosis. Anterior right bladder diverticulum present. Stomach/Bowel: There is diffuse  mild dilatation of the colon without definitive transition point. Air-fluid levels are seen throughout the colon. There is no wall thickening or surrounding inflammation. There is no pneumatosis or free air. The appendix is not seen. Additionally there is fluid distention of ileal loops. There is a moderate air-fluid level in the stomach. There is sigmoid colon diverticulosis. Vascular/Lymphatic: Aortic atherosclerosis. No enlarged abdominal or pelvic lymph nodes. Reproductive: The prostate gland is enlarged. Radiotherapy seeds are present. There is nodular protrusion into the bladder base which is rounded measuring 19 mm as seen on prior. Other: Fat containing umbilical hernia is unchanged.  No ascites. Musculoskeletal: No fracture is seen. IMPRESSION: 1. Diffuse mild dilatation of the colon with air-fluid levels. No definitive transition point. Findings may be related to ileus or colitis. 2. Fluid distention of ileal loops and stomach. 3. Nonobstructing right renal calculus. 4. Stable right adrenal nodule, likely an adenoma. 5. Stable bilateral renal cysts. 6. Stable nodular protrusion of the prostate gland into the bladder base. 7. Aortic atherosclerosis. Aortic Atherosclerosis (ICD10-I70.0). Electronically Signed   By: Greig Pique M.D.   On: 04/21/2024 21:25   DG Chest Port 1 View Result Date: 04/21/2024 CLINICAL DATA:  Shortness of breath. EXAM: PORTABLE CHEST 1 VIEW COMPARISON:  February 08, 2024 FINDINGS: The left-sided PICC line seen on the prior study has been removed. The cardiac silhouette is mildly enlarged and unchanged in size. Low lung volumes are noted. There is mild right perihilar linear scarring and/or atelectasis. No acute infiltrate, pleural effusion or pneumothorax is identified. Air-filled loops of large bowel is seen within the visualized portion of the upper abdomen. Marked severity degenerative changes are seen involving the right shoulder. The visualized skeletal structures are  otherwise unremarkable. IMPRESSION: Low lung volumes with mild right perihilar linear scarring and/or atelectasis. Electronically Signed   By: Suzen Dials M.D.   On: 04/21/2024 19:58   CT Cervical Spine Wo Contrast Result Date: 04/21/2024 EXAM: CT CERVICAL SPINE WITHOUT CONTRAST 04/21/2024 07:45:03 PM TECHNIQUE: CT of the cervical spine was performed without the administration of intravenous contrast. Multiplanar reformatted images are provided for review. Automated exposure control, iterative reconstruction, and/or weight based adjustment of the mA/kV was utilized  to reduce the radiation dose to as low as reasonably achievable. COMPARISON: None available. CLINICAL HISTORY: Neck trauma (Age >= 65y) FINDINGS: CERVICAL SPINE: BONES AND ALIGNMENT: No acute fracture or traumatic malalignment. DEGENERATIVE CHANGES: Moderate disc space height loss and degenerative endplate changes at C3-C4, C4-C5, and C5-C6. Posterior disc osteophyte complexes at these levels causing mild effacement of the ventral thecal sac. No severe spinal canal narrowing. SOFT TISSUES: No prevertebral soft tissue swelling. Right thyroid  nodules measuring up to 1.6 cm. IMPRESSION: 1. No acute abnormality of the cervical spine . 2. Incidental right thyroid  nodule measuring up to 1.6 cm. Recommend non-emergent thyroid  ultrasound per ACR guidelines. Electronically signed by: Norman Gatlin MD 04/21/2024 07:57 PM EST RP Workstation: [id number]   CT Head Wo Contrast Result Date: 04/21/2024 EXAM: CT HEAD WITHOUT 04/21/2024 07:45:03 PM TECHNIQUE: CT of the head was performed without the administration of intravenous contrast. Automated exposure control, iterative reconstruction, and/or weight based adjustment of the mA/kV was utilized to reduce the radiation dose to as low as reasonably achievable. COMPARISON: 12/ 29 / 11. CLINICAL HISTORY: Head trauma, minor (Age >= 65y) FINDINGS: BRAIN AND VENTRICLES: No acute intracranial hemorrhage. No mass  effect or midline shift. No extra-axial fluid collection. No evidence of acute infarct. No hydrocephalus. 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 Mild periventricular white matter disease. Moderate calcific atheromatous disease within carotid siphons and vertebral arteries. ORBITS: No acute abnormality. Bilateral lens replacement noted. SINUSES AND MASTOIDS: No acute abnormality. SOFT TISSUES AND SKULL: No acute skull fracture. No acute soft tissue abnormality. IMPRESSION: 1. No acute intracranial abnormality. Electronically signed by: Norman Gatlin MD 04/21/2024 07:54 PM EST RP Workstation: [id number]     .Critical Care  Performed by: Freddi Hamilton, MD Authorized by: Freddi Hamilton, MD   Critical care provider statement:    Critical care time (minutes):  35   Critical care time was exclusive of:  Separately billable procedures and treating other patients   Critical care was necessary to treat or prevent imminent or life-threatening deterioration of the following conditions:  Renal failure and shock   Critical care was time spent personally by me on the following activities:  Development of treatment plan with patient or surrogate, discussions with consultants, evaluation of patient's response to treatment, examination of patient, ordering and review of laboratory studies, ordering and review of radiographic studies, ordering and performing treatments and interventions, pulse oximetry, re-evaluation of patient's condition and review of old charts    Medications Ordered in the ED  lactated ringers  infusion ( Intravenous New Bag/Given 04/21/24 2242)  lactated ringers  bolus 500 mL (0 mLs Intravenous Stopped 04/21/24 2133)  acetaminophen  (TYLENOL ) tablet 650 mg (650 mg Oral Given 04/21/24 2002)  potassium chloride  SA (KLOR-CON  M) CR tablet 40 mEq (40 mEq Oral Given 04/21/24 2107)  metoCLOPramide  (REGLAN ) injection 5 mg (5 mg Intravenous Given 04/21/24 2256)    Clinical Course as of  04/21/24 2342  Fri Apr 21, 2024  2239 I discussed with Dr. Alfornia.  She is asking for a lactic acid prior to bed request as she is concerned he may need a higher level of care.  Current blood pressure 96/52.  He does not seem altered.  Will add a lactate and then message her with the results. [SG]    Clinical Course User Index [SG] Freddi Hamilton, MD  Medical Decision Making Amount and/or Complexity of Data Reviewed Independent Historian: spouse Labs: ordered.    Details: AKI Radiology: ordered and independent interpretation performed.    Details: No head bleed, ileus ECG/medicine tests: ordered and independent interpretation performed.    Details: Chronic findings, no acute ischemia or arrhythmia  Risk OTC drugs. Prescription drug management. Decision regarding hospitalization.   Patient with fall.  Likely from hypovolemia from recent diarrhea and vomiting.  He initially had some blood pressures but then developed soft and low pressures in the 80s.  Maps are always around 60 or 65.  Given some gentle fluids given his known history of significant systolic CHF.  His blood pressures are coming up and now 95/58 on last recheck during my care.  As above, lactate was sent but is negative.  Troponins are minimally elevated but seems consistent with priors and not consistent with ACS.  CT shows significant ileus but this has been present for a while.  Will need admission for AKI.  Likely from GI losses from a viral GI illness.  Discussed with Dr. Alfornia for admission.     Final diagnoses:  Acute kidney injury    ED Discharge Orders     None          Freddi Hamilton, MD 04/21/24 [id number]    Freddi Hamilton, MD 04/21/24 [phone]

## 2024-04-21 NOTE — Plan of Care (Signed)
 Plan of Care Note for accepted transfer   Patient name: Chase Combs FMW:[id number] DOB: Jun 07, 1945  Facility requesting transfer: [facility] ED Requesting Provider: Dr. 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  Patient vomiting and having diarrhea since yesterday.  No shortness of breath or chest pain reported.  Patient hypotensive with SBP in the 80s.  Hypoxemic with SpO2 in the high 80s on room air, improved with 2 L [place].  Noted to be in sinus rhythm and EKG without acute changes.  CBC notable for WBC count 3.3, hemoglobin 12.7 (stable).  CMP notable for sodium 134, potassium 3.0, chloride 93, glucose 132, BUN 38, creatinine 1.65 (baseline around 1.0).  UA pending.  INR 1.7.  Magnesium  1.8.  Troponin 71> 67.  COVID/influenza/RSV PCR negative.  Lactic acid 1.2.  CT head/C-spine negative for acute traumatic injuries.  Chest x-ray showing low lung volumes with mild right perihilar linear scarring and/or atelectasis.  CT abdomen pelvis showing findings suspicious for ileus versus colitis.  Patient was given Tylenol , Reglan , oral potassium, and 500 mL LR bolus and started on continuous IV fluid infusion.  SBP improved to 90s after IV fluids.  Plan of care: The patient is accepted for admission to Progressive unit at [facility].  [facility] will assume care on arrival to accepting facility. Until arrival, care as per EDP. However, [facility] available 24/7 for questions and assistance.  Check [email or web address] for on-call coverage.  Nursing staff, please call [facility] Admits & Consults System-Wide number under Amion on patient's arrival so appropriate admitting provider can evaluate the pt.

## 2024-04-21 NOTE — ED Notes (Signed)
-  Called [facility] for transportation to [id number].

## 2024-04-22 ENCOUNTER — Encounter (HOSPITAL_COMMUNITY): Payer: Self-pay | Admitting: Internal Medicine

## 2024-04-22 DIAGNOSIS — E876 Hypokalemia: Secondary | ICD-10-CM | POA: Diagnosis not present

## 2024-04-22 DIAGNOSIS — E782 Mixed hyperlipidemia: Secondary | ICD-10-CM

## 2024-04-22 DIAGNOSIS — Y92009 Unspecified place in unspecified non-institutional (private) residence as the place of occurrence of the external cause: Secondary | ICD-10-CM

## 2024-04-22 DIAGNOSIS — R55 Syncope and collapse: Secondary | ICD-10-CM

## 2024-04-22 DIAGNOSIS — I959 Hypotension, unspecified: Secondary | ICD-10-CM | POA: Diagnosis not present

## 2024-04-22 DIAGNOSIS — E1122 Type 2 diabetes mellitus with diabetic chronic kidney disease: Secondary | ICD-10-CM | POA: Diagnosis present

## 2024-04-22 DIAGNOSIS — R7989 Other specified abnormal findings of blood chemistry: Secondary | ICD-10-CM | POA: Diagnosis present

## 2024-04-22 DIAGNOSIS — I5023 Acute on chronic systolic (congestive) heart failure: Secondary | ICD-10-CM | POA: Diagnosis not present

## 2024-04-22 DIAGNOSIS — J189 Pneumonia, unspecified organism: Secondary | ICD-10-CM | POA: Diagnosis not present

## 2024-04-22 DIAGNOSIS — I4892 Unspecified atrial flutter: Secondary | ICD-10-CM | POA: Diagnosis present

## 2024-04-22 DIAGNOSIS — E041 Nontoxic single thyroid nodule: Secondary | ICD-10-CM | POA: Diagnosis present

## 2024-04-22 DIAGNOSIS — J9601 Acute respiratory failure with hypoxia: Secondary | ICD-10-CM | POA: Diagnosis not present

## 2024-04-22 DIAGNOSIS — Z1152 Encounter for screening for COVID-19: Secondary | ICD-10-CM | POA: Diagnosis not present

## 2024-04-22 DIAGNOSIS — D638 Anemia in other chronic diseases classified elsewhere: Secondary | ICD-10-CM | POA: Diagnosis not present

## 2024-04-22 DIAGNOSIS — E43 Unspecified severe protein-calorie malnutrition: Secondary | ICD-10-CM | POA: Diagnosis present

## 2024-04-22 DIAGNOSIS — R531 Weakness: Secondary | ICD-10-CM | POA: Diagnosis not present

## 2024-04-22 DIAGNOSIS — I4819 Other persistent atrial fibrillation: Secondary | ICD-10-CM | POA: Diagnosis present

## 2024-04-22 DIAGNOSIS — K279 Peptic ulcer, site unspecified, unspecified as acute or chronic, without hemorrhage or perforation: Secondary | ICD-10-CM | POA: Diagnosis present

## 2024-04-22 DIAGNOSIS — W19XXXA Unspecified fall, initial encounter: Secondary | ICD-10-CM

## 2024-04-22 DIAGNOSIS — R42 Dizziness and giddiness: Secondary | ICD-10-CM

## 2024-04-22 DIAGNOSIS — Z8546 Personal history of malignant neoplasm of prostate: Secondary | ICD-10-CM | POA: Diagnosis not present

## 2024-04-22 DIAGNOSIS — D6832 Hemorrhagic disorder due to extrinsic circulating anticoagulants: Secondary | ICD-10-CM | POA: Diagnosis present

## 2024-04-22 DIAGNOSIS — D631 Anemia in chronic kidney disease: Secondary | ICD-10-CM | POA: Diagnosis present

## 2024-04-22 DIAGNOSIS — K560 Paralytic ileus: Secondary | ICD-10-CM | POA: Diagnosis present

## 2024-04-22 DIAGNOSIS — K567 Ileus, unspecified: Secondary | ICD-10-CM | POA: Diagnosis not present

## 2024-04-22 DIAGNOSIS — K529 Noninfective gastroenteritis and colitis, unspecified: Secondary | ICD-10-CM | POA: Diagnosis present

## 2024-04-22 DIAGNOSIS — Z789 Other specified health status: Secondary | ICD-10-CM | POA: Diagnosis not present

## 2024-04-22 DIAGNOSIS — I2510 Atherosclerotic heart disease of native coronary artery without angina pectoris: Secondary | ICD-10-CM | POA: Diagnosis not present

## 2024-04-22 DIAGNOSIS — I130 Hypertensive heart and chronic kidney disease with heart failure and stage 1 through stage 4 chronic kidney disease, or unspecified chronic kidney disease: Secondary | ICD-10-CM | POA: Diagnosis present

## 2024-04-22 DIAGNOSIS — J9621 Acute and chronic respiratory failure with hypoxia: Secondary | ICD-10-CM | POA: Diagnosis present

## 2024-04-22 DIAGNOSIS — E119 Type 2 diabetes mellitus without complications: Secondary | ICD-10-CM | POA: Diagnosis not present

## 2024-04-22 DIAGNOSIS — I5A Non-ischemic myocardial injury (non-traumatic): Secondary | ICD-10-CM | POA: Diagnosis present

## 2024-04-22 DIAGNOSIS — Z7189 Other specified counseling: Secondary | ICD-10-CM | POA: Diagnosis not present

## 2024-04-22 DIAGNOSIS — I428 Other cardiomyopathies: Secondary | ICD-10-CM | POA: Diagnosis present

## 2024-04-22 DIAGNOSIS — N179 Acute kidney failure, unspecified: Secondary | ICD-10-CM

## 2024-04-22 DIAGNOSIS — W1830XA Fall on same level, unspecified, initial encounter: Secondary | ICD-10-CM | POA: Diagnosis present

## 2024-04-22 DIAGNOSIS — I255 Ischemic cardiomyopathy: Secondary | ICD-10-CM | POA: Diagnosis not present

## 2024-04-22 DIAGNOSIS — Z7902 Long term (current) use of antithrombotics/antiplatelets: Secondary | ICD-10-CM | POA: Diagnosis not present

## 2024-04-22 DIAGNOSIS — Z7901 Long term (current) use of anticoagulants: Secondary | ICD-10-CM | POA: Diagnosis not present

## 2024-04-22 DIAGNOSIS — I10 Essential (primary) hypertension: Secondary | ICD-10-CM | POA: Diagnosis not present

## 2024-04-22 DIAGNOSIS — Z8616 Personal history of COVID-19: Secondary | ICD-10-CM | POA: Diagnosis not present

## 2024-04-22 DIAGNOSIS — E785 Hyperlipidemia, unspecified: Secondary | ICD-10-CM | POA: Diagnosis not present

## 2024-04-22 DIAGNOSIS — I071 Rheumatic tricuspid insufficiency: Secondary | ICD-10-CM | POA: Diagnosis present

## 2024-04-22 DIAGNOSIS — I5042 Chronic combined systolic (congestive) and diastolic (congestive) heart failure: Secondary | ICD-10-CM | POA: Diagnosis not present

## 2024-04-22 DIAGNOSIS — K5981 Ogilvie syndrome: Secondary | ICD-10-CM | POA: Diagnosis not present

## 2024-04-22 DIAGNOSIS — I5022 Chronic systolic (congestive) heart failure: Secondary | ICD-10-CM

## 2024-04-22 DIAGNOSIS — Z66 Do not resuscitate: Secondary | ICD-10-CM | POA: Diagnosis present

## 2024-04-22 DIAGNOSIS — N1831 Chronic kidney disease, stage 3a: Secondary | ICD-10-CM | POA: Diagnosis present

## 2024-04-22 DIAGNOSIS — Z515 Encounter for palliative care: Secondary | ICD-10-CM | POA: Diagnosis not present

## 2024-04-22 DIAGNOSIS — I5043 Acute on chronic combined systolic (congestive) and diastolic (congestive) heart failure: Secondary | ICD-10-CM | POA: Diagnosis present

## 2024-04-22 DIAGNOSIS — I480 Paroxysmal atrial fibrillation: Secondary | ICD-10-CM | POA: Diagnosis not present

## 2024-04-22 DIAGNOSIS — I5020 Unspecified systolic (congestive) heart failure: Secondary | ICD-10-CM | POA: Diagnosis not present

## 2024-04-22 DIAGNOSIS — G9340 Encephalopathy, unspecified: Secondary | ICD-10-CM | POA: Diagnosis present

## 2024-04-22 LAB — URINALYSIS, ROUTINE W REFLEX MICROSCOPIC
Bilirubin Urine: NEGATIVE
Glucose, UA: NEGATIVE mg/dL
Hgb urine dipstick: NEGATIVE
Ketones, ur: NEGATIVE mg/dL
Leukocytes,Ua: NEGATIVE
Nitrite: NEGATIVE
Protein, ur: NEGATIVE mg/dL
Specific Gravity, Urine: >1.03 — ABNORMAL HIGH (ref 1.005–1.030)
pH: 5.5 (ref 5.0–8.0)

## 2024-04-22 LAB — BASIC METABOLIC PANEL WITH GFR
Anion gap: 15 (ref 5–15)
BUN: 59 mg/dL — ABNORMAL HIGH (ref 8–23)
CO2: 19 mmol/L — ABNORMAL LOW (ref 22–32)
Calcium: 8.5 mg/dL — ABNORMAL LOW (ref 8.9–10.3)
Chloride: 101 mmol/L (ref 98–111)
Creatinine, Ser: 2.05 mg/dL — ABNORMAL HIGH (ref 0.61–1.24)
GFR, Estimated: 33 mL/min — ABNORMAL LOW (ref >60)
Glucose, Bld: 104 mg/dL — ABNORMAL HIGH (ref 70–99)
Potassium: 3.4 mmol/L — ABNORMAL LOW (ref 3.5–5.1)
Sodium: 135 mmol/L (ref 135–145)

## 2024-04-22 LAB — C DIFFICILE QUICK SCREEN W PCR REFLEX
C Diff antigen: NEGATIVE
C Diff interpretation: NOT DETECTED
C Diff toxin: NEGATIVE

## 2024-04-22 LAB — COMPREHENSIVE METABOLIC PANEL WITH GFR
ALT: 25 U/L (ref 0–44)
AST: 27 U/L (ref 15–41)
Albumin: 2.7 g/dL — ABNORMAL LOW (ref 3.5–5.0)
Alkaline Phosphatase: 54 U/L (ref 38–126)
Anion gap: 10 (ref 5–15)
BUN: 46 mg/dL — ABNORMAL HIGH (ref 8–23)
CO2: 23 mmol/L (ref 22–32)
Calcium: 8.4 mg/dL — ABNORMAL LOW (ref 8.9–10.3)
Chloride: 96 mmol/L — ABNORMAL LOW (ref 98–111)
Creatinine, Ser: 2.1 mg/dL — ABNORMAL HIGH (ref 0.61–1.24)
GFR, Estimated: 32 mL/min — ABNORMAL LOW (ref >60)
Glucose, Bld: 127 mg/dL — ABNORMAL HIGH (ref 70–99)
Potassium: 3.2 mmol/L — ABNORMAL LOW (ref 3.5–5.1)
Sodium: 129 mmol/L — ABNORMAL LOW (ref 135–145)
Total Bilirubin: 0.7 mg/dL (ref 0.0–1.2)
Total Protein: 5.6 g/dL — ABNORMAL LOW (ref 6.5–8.1)

## 2024-04-22 LAB — CBC WITH DIFFERENTIAL/PLATELET
Basophils Absolute: 0 K/uL (ref 0.0–0.1)
Basophils Relative: 0 %
Eosinophils Absolute: 0 K/uL (ref 0.0–0.5)
Eosinophils Relative: 0 %
HCT: 33.3 % — ABNORMAL LOW (ref 39.0–52.0)
Hemoglobin: 11.1 g/dL — ABNORMAL LOW (ref 13.0–17.0)
Lymphocytes Relative: 16 %
Lymphs Abs: 0.8 K/uL (ref 0.7–4.0)
MCH: 30.1 pg (ref 26.0–34.0)
MCHC: 33.3 g/dL (ref 30.0–36.0)
MCV: 90.2 fL (ref 80.0–100.0)
Monocytes Absolute: 1 K/uL (ref 0.1–1.0)
Monocytes Relative: 22 %
Neutro Abs: 2.9 K/uL (ref 1.7–7.7)
Neutrophils Relative %: 62 %
Platelets: 309 K/uL (ref 150–400)
RBC: 3.69 MIL/uL — ABNORMAL LOW (ref 4.22–5.81)
RDW: 18.9 % — ABNORMAL HIGH (ref 11.5–15.5)
WBC: 4.7 K/uL (ref 4.0–10.5)
nRBC: 0 % (ref 0.0–0.2)

## 2024-04-22 LAB — DIGOXIN LEVEL: Digoxin Level: 0.7 ng/mL — ABNORMAL LOW (ref 0.8–2.0)

## 2024-04-22 LAB — BRAIN NATRIURETIC PEPTIDE: B Natriuretic Peptide: 844.7 pg/mL — ABNORMAL HIGH (ref 0.0–100.0)

## 2024-04-22 LAB — OSMOLALITY: Osmolality: 293 mosm/kg (ref 275–295)

## 2024-04-22 LAB — GLUCOSE, CAPILLARY
Glucose-Capillary: 123 mg/dL — ABNORMAL HIGH (ref 70–99)
Glucose-Capillary: 111 mg/dL — ABNORMAL HIGH (ref 70–99)
Glucose-Capillary: 110 mg/dL — ABNORMAL HIGH (ref 70–99)

## 2024-04-22 LAB — TSH: TSH: 1.029 u[IU]/mL (ref 0.350–4.500)

## 2024-04-22 LAB — MAGNESIUM
Magnesium: 1.6 mg/dL — ABNORMAL LOW (ref 1.7–2.4)
Magnesium: 2 mg/dL (ref 1.7–2.4)

## 2024-04-22 LAB — LACTIC ACID, PLASMA: Lactic Acid, Venous: 1.6 mmol/L (ref 0.5–1.9)

## 2024-04-22 LAB — SODIUM, URINE, RANDOM: Sodium, Ur: <30 mmol/L

## 2024-04-22 LAB — CREATININE, URINE, RANDOM: Creatinine, Urine: 138 mg/dL

## 2024-04-22 LAB — PROCALCITONIN: Procalcitonin: 8.67 ng/mL

## 2024-04-22 MED ORDER — ACETAMINOPHEN 325 MG PO TABS
650.0000 mg | ORAL_TABLET | Freq: Four times a day (QID) | ORAL | Status: DC | PRN
  Administered 2024-04-22 – 2024-05-08 (×13): 650 mg via ORAL
  Filled 2024-04-22 (×13): qty 2

## 2024-04-22 MED ORDER — AMIODARONE HCL 200 MG PO TABS
200.0000 mg | ORAL_TABLET | Freq: Every day | ORAL | Status: DC
  Administered 2024-04-22 – 2024-04-25 (×4): 200 mg via ORAL
  Filled 2024-04-22 (×4): qty 1

## 2024-04-22 MED ORDER — INSULIN ASPART 100 UNIT/ML IJ SOLN
0.0000 [IU] | Freq: Four times a day (QID) | INTRAMUSCULAR | Status: DC
  Administered 2024-04-25: 1 [IU] via SUBCUTANEOUS
  Filled 2024-04-22: qty 1

## 2024-04-22 MED ORDER — SODIUM CHLORIDE 0.9 % IV SOLN: INTRAVENOUS | Status: AC

## 2024-04-22 MED ORDER — APIXABAN 5 MG PO TABS
5.0000 mg | ORAL_TABLET | Freq: Two times a day (BID) | ORAL | Status: DC
  Administered 2024-04-22 – 2024-04-25 (×8): 5 mg via ORAL
  Filled 2024-04-22: qty 1
  Filled 2024-04-22: qty 2
  Filled 2024-04-22: qty 1
  Filled 2024-04-22 (×2): qty 2
  Filled 2024-04-22 (×3): qty 1

## 2024-04-22 MED ORDER — MELATONIN 3 MG PO TABS
3.0000 mg | ORAL_TABLET | Freq: Every evening | ORAL | Status: DC | PRN
  Administered 2024-04-25: 3 mg via ORAL
  Filled 2024-04-22: qty 1

## 2024-04-22 MED ORDER — POTASSIUM CHLORIDE CRYS ER 20 MEQ PO TBCR: 40.0000 meq | EXTENDED_RELEASE_TABLET | Freq: Once | ORAL | Status: DC

## 2024-04-22 MED ORDER — PANTOPRAZOLE SODIUM 40 MG IV SOLR
40.0000 mg | INTRAVENOUS | Status: DC
  Administered 2024-04-22 – 2024-04-23 (×2): 40 mg via INTRAVENOUS
  Filled 2024-04-22 (×2): qty 10

## 2024-04-22 MED ORDER — SODIUM CHLORIDE 0.9 % IV SOLN: Freq: Once | INTRAVENOUS | Status: AC

## 2024-04-22 MED ORDER — ONDANSETRON HCL 4 MG/2ML IJ SOLN
4.0000 mg | Freq: Four times a day (QID) | INTRAMUSCULAR | Status: DC | PRN
  Administered 2024-04-22 – 2024-05-02 (×5): 4 mg via INTRAVENOUS
  Filled 2024-04-22 (×5): qty 2

## 2024-04-22 MED ORDER — POTASSIUM CHLORIDE 20 MEQ PO PACK
40.0000 meq | PACK | Freq: Once | ORAL | Status: AC
  Administered 2024-04-22: 40 meq via ORAL
  Filled 2024-04-22: qty 2

## 2024-04-22 MED ORDER — MAGNESIUM SULFATE IN D5W 1-5 GM/100ML-% IV SOLN
1.0000 g | Freq: Once | INTRAVENOUS | Status: AC
  Administered 2024-04-22: 1 g via INTRAVENOUS
  Filled 2024-04-22: qty 100

## 2024-04-22 MED ORDER — ACETAMINOPHEN 650 MG RE SUPP: 650.0000 mg | Freq: Four times a day (QID) | RECTAL | Status: DC | PRN

## 2024-04-22 MED ORDER — POTASSIUM CHLORIDE 10 MEQ/100ML IV SOLN: 10.0000 meq | INTRAVENOUS | Status: DC

## 2024-04-22 MED ORDER — CLOPIDOGREL BISULFATE 75 MG PO TABS
75.0000 mg | ORAL_TABLET | Freq: Every day | ORAL | Status: DC
  Administered 2024-04-22 – 2024-05-11 (×20): 75 mg via ORAL
  Filled 2024-04-22 (×20): qty 1

## 2024-04-22 MED ORDER — POTASSIUM CHLORIDE 10 MEQ/100ML IV SOLN
10.0000 meq | INTRAVENOUS | Status: AC
  Administered 2024-04-22 (×3): 10 meq via INTRAVENOUS
  Filled 2024-04-22 (×3): qty 100

## 2024-04-22 MED ORDER — ROSUVASTATIN CALCIUM 20 MG PO TABS
20.0000 mg | ORAL_TABLET | Freq: Every day | ORAL | Status: DC
  Administered 2024-04-22 – 2024-05-11 (×20): 20 mg via ORAL
  Filled 2024-04-22 (×20): qty 1

## 2024-04-22 NOTE — Plan of Care (Signed)
  Problem: Nutrition: Goal: Adequate nutrition will be maintained Outcome: Progressing   Problem: Elimination: Goal: Will not experience complications related to bowel motility Outcome: Progressing Goal: Will not experience complications related to urinary retention Outcome: Progressing   Problem: Safety: Goal: Ability to remain free from injury will improve Outcome: Progressing   Problem: Skin Integrity: Goal: Risk for impaired skin integrity will decrease Outcome: Progressing   Problem: Education: Goal: Ability to describe self-care measures that may prevent or decrease complications (Diabetes Survival Skills Education) will improve Outcome: Progressing Goal: Individualized Educational Video(s) Outcome: Progressing

## 2024-04-22 NOTE — Progress Notes (Addendum)
 PROGRESS NOTE    Chase Combs  FMW:[id number] DOB: Apr 23, 1946 DOA: 04/21/2024 PCP: Wonda Worth SQUIBB, PA  Subjective:  No acute events since admission. As per nursing, noted to have low BP early this morning with some associated dizziness and lightheadedness. Seen and examined at bedside. Reports feeling weak, tired, thirsty and lightheaded. Denies chest pain, shortness of breath, nausea, constipation.    Hospital Course:  78 y.o. 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 Of note, patient had recent complicated admission for septic shock, cardiogenic shock, intestinal pneumatosis/ileus/pseudoobstruction/Ogilvie syndrome. Admitted for suspected gastroenterocolitis, AKI on CKD, hypotension and acute debilitation.    Assessment and Plan:  Presyncope Mechanical fall Hypotension Likely in the setting of dehydration/orthostatic hypotension from possible ileus/pseudoobstruction/gastroenterocolitis CT head unremarkable NPO for now  Hold BP meds as elsewhere Given LR in ED and started on 60ml/hr LR on admission Will stop LR Will give NS bolus then 100ml/hr Monitor on tele  Possible Ileus Gastroenterocolitis Recurrent Ogilvie Syndrome History of recent hospitalization (01/22/24-02/25/24) for similar GI symptoms with extensive workup with assistance from general surgery and GI services. Underwent colonic decompression (02/18/24)  with improvement.  Presented with nausea, vomiting, diarrhea  CT A/P w/o contrast with noted diffuse mild dilatation of colon, distended ileal loops and stomach Hold off on antibiotics for now Will keep NPO Zofran  PRN IV fluids as elsewhere Will check enteric panel  Will check C diff Aspiration precautions May need rectal tube if diarrhea gets persistent May need repeat colonic decompression Will get GI consult  AKI on CKD Cr 2.1 < 1.65, baseline 0.9-1.5 Etiology unclear, likely in the setting of dehydration from diarrhea CT A/P with no concern for obstructive disease IV fluids as elsewhere Hold home entresto , spironolactone , lasix  Urine lytes pending Place on external foley for strict I/Os Avoid nephrotoxic agents Renal dose medications Monitor BMP  Non-ischemic myocardial injury CAD Recent balloon angioplasty of LAD stent thrombosis (02/01/2024) due to inability to tolerate oral DAPT because of intractable nausea and vomiting History of remote PCI to RI and RCA EKG with known RBBB, LAFB, no significant changes from prior Troponin 67 < 71 Cont home plavix , eliquis  Will need to touch base with cardiology and switch to IV alternatives if unable to take plavix  and eliquis  orally Monitor on tele Monitor clinically  Acute hypoxia Noted to have cough, shortness of breath, R > Lung crackles on ED presentation. Concern for orthopnea also present Likely in the setting of abdominal distension from underlying ileus/pseudoobstruction/gastroenterocolitis Currently on 2L oxygen, not on oxygen at home See management of ileus as elsewhere Wean oxygen as tolerated   CHFrEF Recent prolonged hospitalization complicated by cardiogenic shock with acute CHF exacerbation in the setting of LAD stent stenosis  Last echo 01/2024 with LVEF 20-25% Not clinically fluid overloaded. In fact, appears hypovolemic on exam BNP better than prior labs Home home lasix , spironolactone , entresto  IV fluids as  elsewhere Monitor I/Os, daily weights, renal function, electrolytes  Paroxysmal Afib S/p TEE CV on 02/21/2024 Dig level 0.7 Cont  home amiodarone  Hold home digoxin  for now given AKI to avoid toxicity Cont home eliquis  Monitor on tele  Hyponatremia Etiology unclear, likely hypovolemic in nature given ongoing diarrhea Switch LR to NS IV fluids as elsewhere Monitor BMP q12h  Peptic ulcer disease Recent hospitalization with EGD (02/02/24) showing esophageal ulcer without bleeding, grade C reflux esophagitis without bleeding, duodenal erosions At home on pantoprazole  40mg  daily Switch to IV pantoprazole  while NPO Monitor for signs/symptoms of acute bleed  Hypokalemia Monitor and replete electrolytes as needed  Hypomagnesemia Monitor and replete electrolytes as needed  T2DM Hold home empagliflozin  Cont SSI with FS q6h while NPO  Incidental R thyroid  nodule Noted on CT C spine TSH normal Will need outpatient thyroid  US   Chronic anemia Hgb 11.2, baseline 10-12 Cont anticoagulation as elsewhere Monitor clinically  HLD Cont home statin  Acute debilitation Mechanical fall Monitor on fall precautions Will need PT/OT eval when more clinically stable  DVT prophylaxis: SCDs Start: 04/22/24 0137 apixaban  (ELIQUIS ) tablet 5 mg  Eliquis    Code Status: Limited: Do not attempt resuscitation (DNR) -DNR-LIMITED -Do Not Intubate/DNI  Disposition Plan: TBD pending clinical course Reason for continuing need for hospitalization: severity of illness  Objective: Vitals:   04/22/24 0230 04/22/24 0755 04/22/24 1131 04/22/24 1157  BP:  (!) 90/51  (!) 113/53  Pulse: 86 84  91  Resp: 20 19  20   Temp:  98.7 F (37.1 C) 98.5 F (36.9 C) 98.5 F (36.9 C)  TempSrc:  Oral Oral Oral  SpO2: 92% 96%  98%  Weight:      Height:        Intake/Output Summary (Last 24 hours) at 04/22/2024 1239 Last data filed at 04/22/2024 1155 Gross per 24 hour  Intake 500.28 ml  Output 331 ml  Net  169.28 ml   Filed Weights   04/21/24 1841 04/22/24 0207  Weight: 85 kg 83.5 kg    Examination:  Physical Exam Vitals and nursing note reviewed.  Constitutional:      General: He is not in acute distress.    Appearance: He is ill-appearing.     Comments: Weak, frail  HENT:     Head: Normocephalic and atraumatic.  Cardiovascular:     Rate and Rhythm: Normal rate and regular rhythm.     Pulses: Normal pulses.     Heart sounds: Normal heart sounds.  Pulmonary:     Effort: Pulmonary effort is normal.     Breath sounds: Normal breath sounds.  Abdominal:     General: Bowel sounds are normal.     Palpations: Abdomen is soft.  Musculoskeletal:     Right lower leg: No edema.     Left lower leg: No edema.  Neurological:     Mental Status: He is alert. Mental status is at baseline.     Data Reviewed: I have personally reviewed following labs and imaging studies  CBC: Recent Labs  Lab 04/21/24 1951 04/22/24 0331  WBC 3.3* 4.7  NEUTROABS  --  2.9  HGB 12.7* 11.1*  HCT 38.4* 33.3*  MCV 91.6 90.2  PLT 364 309   Basic Metabolic Panel: Recent Labs  Lab 04/21/24 1951 04/22/24 0331  NA 134* 129*  K 3.0* 3.2*  CL 93* 96*  CO2 22 23  GLUCOSE 132* 127*  BUN 38* 46*  CREATININE 1.65* 2.10*  CALCIUM  9.9 8.4*  MG 1.8 1.6*   GFR: Estimated Creatinine Clearance: 32.8 mL/min (A) (by C-G formula based on SCr of 2.1 mg/dL (H)).  Liver Function Tests: Recent Labs  Lab 04/21/24 1951 04/22/24 0331  AST 22 27  ALT 20 25  ALKPHOS 86 54  BILITOT 0.8 0.7  PROT 7.3 5.6*  ALBUMIN 4.1 2.7*   No results for input(s): LIPASE, AMYLASE in the last 168 hours. No results for input(s): AMMONIA in the last 168 hours. Coagulation Profile: Recent Labs  Lab 04/21/24 1951  INR 1.7*   Cardiac Enzymes: No results for input(s): CKTOTAL, CKMB, CKMBINDEX, TROPONINI in the last 168 hours. ProBNP, BNP (last 5 results) Recent Labs    01/07/24 2301 01/11/24 0539  01/22/24 1243 03/09/24 1444 03/23/24 1456 04/07/24 1235 04/22/24 0607  PROBNP 15,245.0*  --   --   --   --   --   --   BNP  --    < > 220.4* 618.6* 1,985.4* 940.4* 844.7*   < > = values in this interval not displayed.   HbA1C: No results for input(s): HGBA1C in the last 72 hours. CBG: Recent Labs  Lab 04/21/24 2044 04/22/24 0702 04/22/24 1229  GLUCAP 142* 123* 111*   Lipid Profile: No results for input(s): CHOL, HDL, LDLCALC, TRIG, CHOLHDL, LDLDIRECT in the last 72 hours. Thyroid  Function Tests: Recent Labs    04/22/24 0609  TSH 1.029   Anemia Panel: No results for input(s): VITAMINB12, FOLATE, FERRITIN, TIBC, IRON, RETICCTPCT in the last 72 hours. Sepsis Labs: Recent Labs  Lab 04/21/24 2240 04/22/24 0607 04/22/24 0843  PROCALCITON  --  8.67  --   LATICACIDVEN 1.2  --  1.6    Recent Results (from the past 240 hours)  Resp panel by RT-PCR (RSV, Flu A&B, Covid) Anterior Nasal Swab     Status: None   Collection Time: 04/21/24  7:51 PM   Specimen: Anterior Nasal Swab  Result Value Ref Range Status   SARS Coronavirus 2 by RT PCR NEGATIVE NEGATIVE Final    Comment: (NOTE) SARS-CoV-2 target nucleic acids are NOT DETECTED.  The SARS-CoV-2 RNA is generally detectable in upper respiratory specimens during the acute phase of infection. The lowest concentration of SARS-CoV-2 viral copies this assay can detect is 138 copies/mL. A negative result does not preclude SARS-Cov-2 infection and should not be used as the sole basis for treatment or other patient management decisions. A negative result may occur with  improper specimen collection/handling, submission of specimen other than nasopharyngeal swab, presence of viral mutation(s) within the areas targeted by this assay, and inadequate number of viral copies(<138 copies/mL). A negative result must be combined with clinical observations, patient history, and epidemiological information. The  expected result is Negative.  Fact Sheet for Patients:  bloggercourse.com  Fact Sheet for Healthcare Providers:  seriousbroker.it  This test is no t yet approved or cleared by the [place]  FDA and  has been authorized for detection and/or diagnosis of SARS-CoV-2 by FDA under an Emergency Use Authorization (EUA). This EUA will remain  in effect (meaning this test can be used) for the duration of the COVID-19 declaration under Section 564(b)(1) of the Act, 21 U.S.C.section 360bbb-3(b)(1), unless the authorization is terminated  or revoked sooner.       Influenza A by PCR NEGATIVE NEGATIVE Final   Influenza B by PCR NEGATIVE NEGATIVE Final    Comment: (NOTE) The Xpert Xpress SARS-CoV-2/FLU/RSV plus assay is intended as an aid in the diagnosis of influenza from Nasopharyngeal swab specimens and should not be used as a sole basis for treatment. Nasal washings and aspirates are unacceptable for Xpert Xpress  SARS-CoV-2/FLU/RSV testing.  Fact Sheet for Patients: bloggercourse.com  Fact Sheet for Healthcare Providers: seriousbroker.it  This test is not yet approved or cleared by the [place]  FDA and has been authorized for detection and/or diagnosis of SARS-CoV-2 by FDA under an Emergency Use Authorization (EUA). This EUA will remain in effect (meaning this test can be used) for the duration of the COVID-19 declaration under Section 564(b)(1) of the Act, 21 U.S.C. section 360bbb-3(b)(1), unless the authorization is terminated or revoked.     Resp Syncytial Virus by PCR NEGATIVE NEGATIVE Final    Comment: (NOTE) Fact Sheet for Patients: bloggercourse.com  Fact Sheet for Healthcare Providers: seriousbroker.it  This test is not yet approved or cleared by the [place]  FDA and has been authorized for detection and/or  diagnosis of SARS-CoV-2 by FDA under an Emergency Use Authorization (EUA). This EUA will remain in effect (meaning this test can be used) for the duration of the COVID-19 declaration under Section 564(b)(1) of the Act, 21 U.S.C. section 360bbb-3(b)(1), unless the authorization is terminated or revoked.  Performed at [facility], [address], [place], [place] [address]      Radiology Studies: CT ABDOMEN PELVIS WO CONTRAST Result Date: 04/21/2024 CLINICAL DATA:  Bowel obstruction suspected. EXAM: CT ABDOMEN AND PELVIS WITHOUT CONTRAST TECHNIQUE: Multidetector CT imaging of the abdomen and pelvis was performed following the standard protocol without IV contrast. RADIATION DOSE REDUCTION: This exam was performed according to the departmental dose-optimization program which includes automated exposure control, adjustment of the mA and/or kV according to patient size and/or use of iterative reconstruction technique. COMPARISON:  CT abdomen and pelvis 02/14/2024. FINDINGS: Lower chest: No acute abnormality. Hepatobiliary: No focal liver abnormality is seen. No gallstones, gallbladder wall thickening, or biliary dilatation. Pancreas: Unremarkable. No pancreatic ductal dilatation or surrounding inflammatory changes. Spleen: Normal in size without focal abnormality. Adrenals/Urinary Tract: Low-density right adrenal nodule measures 12 Hounsfield units and appears unchanged (10 mm). The left adrenal gland is within normal limits. There is a single 3 mm calculus in the right kidney. Bilateral renal cysts appear unchanged. Subcentimeter rounded hyperdensity in the left cyst is also unchanged, possibly a complex cyst. There is no hydronephrosis. Anterior right bladder diverticulum present. Stomach/Bowel: There is diffuse mild dilatation of the colon without definitive transition point. Air-fluid levels are seen throughout the colon. There is no wall thickening or surrounding inflammation.  There is no pneumatosis or free air. The appendix is not seen. Additionally there is fluid distention of ileal loops. There is a moderate air-fluid level in the stomach. There is sigmoid colon diverticulosis. Vascular/Lymphatic: Aortic atherosclerosis. No enlarged abdominal or pelvic lymph nodes. Reproductive: The prostate gland is enlarged. Radiotherapy seeds are present. There is nodular protrusion into the bladder base which is rounded measuring 19 mm as seen on prior. Other: Fat containing umbilical hernia is unchanged.  No ascites. Musculoskeletal: No fracture is seen. IMPRESSION: 1. Diffuse mild dilatation of the colon with air-fluid levels. No definitive transition point. Findings may be related to ileus or colitis. 2. Fluid distention of ileal loops and stomach. 3. Nonobstructing right renal calculus. 4. Stable right adrenal nodule, likely an adenoma. 5. Stable bilateral renal cysts. 6. Stable nodular protrusion of the prostate gland into the bladder base. 7. Aortic atherosclerosis. Aortic Atherosclerosis (ICD10-I70.0). Electronically Signed   By: Greig Pique M.D.   On: 04/21/2024 21:25   DG Chest Port 1 View Result Date: 04/21/2024 CLINICAL DATA:  Shortness of breath. EXAM: PORTABLE CHEST 1 VIEW COMPARISON:  February 08, 2024 FINDINGS: The left-sided PICC line seen on the prior study has been removed. The cardiac silhouette is mildly enlarged and unchanged in size. Low lung volumes are noted. There is mild right perihilar linear scarring and/or atelectasis. No acute infiltrate, pleural effusion or pneumothorax is identified. Air-filled loops of large bowel is seen within the visualized portion of the upper abdomen. Marked severity degenerative changes are seen involving the right shoulder. The visualized skeletal structures are otherwise unremarkable. IMPRESSION: Low lung volumes with mild right perihilar linear scarring and/or atelectasis. Electronically Signed   By: Suzen Dials M.D.   On:  04/21/2024 19:58   CT Cervical Spine Wo Contrast Result Date: 04/21/2024 EXAM: CT CERVICAL SPINE WITHOUT CONTRAST 04/21/2024 07:45:03 PM TECHNIQUE: CT of the cervical spine was performed without the administration of intravenous contrast. Multiplanar reformatted images are provided for review. Automated exposure control, iterative reconstruction, and/or weight based adjustment of the mA/kV was utilized to reduce the radiation dose to as low as reasonably achievable. COMPARISON: None available. CLINICAL HISTORY: Neck trauma (Age >= 65y) FINDINGS: CERVICAL SPINE: BONES AND ALIGNMENT: No acute fracture or traumatic malalignment. DEGENERATIVE CHANGES: Moderate disc space height loss and degenerative endplate changes at C3-C4, C4-C5, and C5-C6. Posterior disc osteophyte complexes at these levels causing mild effacement of the ventral thecal sac. No severe spinal canal narrowing. SOFT TISSUES: No prevertebral soft tissue swelling. Right thyroid  nodules measuring up to 1.6 cm. IMPRESSION: 1. No acute abnormality of the cervical spine . 2. Incidental right thyroid  nodule measuring up to 1.6 cm. Recommend non-emergent thyroid  ultrasound per ACR guidelines. Electronically signed by: Norman Gatlin MD 04/21/2024 07:57 PM EST RP Workstation: [id number]   CT Head Wo Contrast Result Date: 04/21/2024 EXAM: CT HEAD WITHOUT 04/21/2024 07:45:03 PM TECHNIQUE: CT of the head was performed without the administration of intravenous contrast. Automated exposure control, iterative reconstruction, and/or weight based adjustment of the mA/kV was utilized to reduce the radiation dose to as low as reasonably achievable. COMPARISON: 12/ 29 / 11. CLINICAL HISTORY: Head trauma, minor (Age >= 65y) FINDINGS: BRAIN AND VENTRICLES: No acute intracranial hemorrhage. No mass effect or midline shift. No extra-axial fluid collection. No evidence of acute infarct. No hydrocephalus. 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 Mild periventricular  white matter disease. Moderate calcific atheromatous disease within carotid siphons and vertebral arteries. ORBITS: No acute abnormality. Bilateral lens replacement noted. SINUSES AND MASTOIDS: No acute abnormality. SOFT TISSUES AND SKULL: No acute skull fracture. No acute soft tissue abnormality. IMPRESSION: 1. No acute intracranial abnormality. Electronically signed by: Norman Gatlin MD 04/21/2024 07:54 PM EST RP Workstation: [id number]    Scheduled Meds:  amiodarone   200 mg Oral Daily   apixaban   5 mg Oral BID   clopidogrel   75 mg Oral Daily   insulin  aspart  0-6 Units Subcutaneous Q6H   rosuvastatin   20 mg Oral Daily   Continuous Infusions:  sodium chloride  100 mL/hr at 04/22/24 0838     LOS: 0 days   Norval Bar, MD  [facility]  04/22/2024, 12:39 PM

## 2024-04-22 NOTE — ED Notes (Signed)
 [facility] in ED preparing pt for transfer

## 2024-04-22 NOTE — H&P (Signed)
 History and Physical      Chase Combs FMW:[id number] DOB: 27-Feb-1946 DOA: 04/21/2024; DOS: 04/22/2024  PCP: Wonda Worth SQUIBB, PA  Patient coming from: home   I have personally briefly reviewed patient's old medical records in [facility]  Chief Complaint: Episode of presyncope  HPI: [name] is a 78 y.o. male with medical history significant for chronic biventricular systolic heart failure, paroxysmal atrial flutter chronically anticoagulated on Eliquis , type 2 diabetes mellitus, who is admitted to [facility] on 04/21/2024 by way of transfer from [facility] with suspected gastroenterocolitis after presenting from home to the latter facility complaining of single episode of presyncope.  Over the last 1 to 2 days, the patient reports development of nausea, vomiting, diarrhea.  He he notes that his symptoms started with nausea resulting in 3-4 episodes of nonbloody, nonbilious emesis.  Following this, he notes development of loose watery stool, noting at least 5-6 episodes of loose watery stool over the course of the last day, noting that his nausea/vomiting is improved while continuing to experience the loose watery stool.  He specifically denies any associated melena or hematochezia.  The symptoms were associated with some generalized crampy, nonradiating abdominal discomfort.  Denies any associated dysuria or gross hematuria.  In the context of the above, he reports significant decline in oral intake over the last 1 to 2 days, including very limited consumption of food and water  over that timeframe.  He also noted associated subjective fever in the absence of chills, fully rigors.  He notes that over the last 6 hours, his loose stool has started to subside, noting that most recent bowel movement occurred approximately around 2000 on 11/28.  He notes that this has been associated with improvement in his crampy abdominal discomfort, with the patient now noting no residual  abdominal discomfort.  He continues to produce flatus and denies any residual nausea/vomiting at this time.  However, while at home yesterday, The nausea, vomiting, diarrhea, he notes that he attempted to rise from a seated to a standing position at which time he developed dizziness, lightheadedness, and while he did not formally lose consciousness, he reports that the symptoms resulted in a, will fall to the floor in which he struck his head on the floor.  This was not associated with any chest pain, shortness of breath.  He has a history of paroxysmal atrial flutter for which he is chronically anticoagulated on Eliquis .  Per chart review, he is status post TEE electrocardioversion on 02/21/2024.  Additionally patient medications notable for amiodarone  as well as digoxin .  His medical history is also notable for chronic biventricular systolic heart failure, with most recent TTE performed on 02/02/2024 demonstrating LVEF 20 to 25%, indeterminate diastolic parameters, moderately reduced right ventricular systolic function, moderate mitral digitation, mild to moderate tricuspid regurgitation and trivial aortic regurgitation.  Is on Lasix  as well as spironolactone  as an outpatient.  This is in addition to outpatient use of Entresto .  Per chart review, baseline creatinine appears to be 0.9-1.2.  Per chart review, it appears that his baseline systolic blood pressures are in the high 90s to low 100s mmHg.   In the setting of the above single episode of presyncope at home, he presented to [facility] for further evaluation and management thereof.     [facility] ED Course:  Vital signs in the ED were notable for the following: Afebrile; heart rates in the 70s to 80s; systolic blood pressures initially in the 70s to 80s, subsidy  improving into the 90s and ultimately the low 100s following initiation of IV fluids, as further detailed below.  Respiratory rate 17-23, oxygen saturation 93 to 99% on room air.  Labs  were notable for the following: CMP was notable for the following: Potassium 3.3, bicarb 22, creatinine 1.65 compared to most recent prior value of 1.14 on 04/07/2024, BUN DeGroote ratio 23, glucose 132, liver enzymes were within normal limits.  Initial high sensitive troponin I was 71, repeat trending down to 67, compared to most recent prior value of 51 on 01/07/2024.  Lactic acid 1.2.  CBC notable for white cell count 3300, hemoglobin 12.7 associated with normocytic/Norocarp properties and ALT up to most recent prior hemoglobin data point of 11.9 on 03/09/2024, platelet count 364.  INR 1.7.  Urinalysis has been ordered, with result currently pending.  COVID, influenza, RSV PCR were all negative.  Per my interpretation, EKG in ED demonstrated the following: Sinus rhythm with bifascicular block, heart rate 80 and no evidence of T wave or ST changes, including no evidence of ST elevation.  Imaging in the ED, per corresponding formal radiology read, was notable for the following: Unconscious CT head showed no evidence of acute intracranial process, including no evidence of intracranial hemorrhage or evidence of acute infarct.  CT cervical spine showed no evidence of acute cervical spine fracture or subluxation injury.  1 view chest x-ray showed low lung volumes with mild right perihilar linear scarring versus atelectasis, will all demonstrating no evidence of infiltrate edema, effusion, or pneumothorax.  CT abdomen/pelvis without contrast showed diffuse mild dilation of the colon without transition point, suggestive of colitis versus ileus, will also noting distention of ileal loops and stomach, without any evidence of bowel perforation, abscess, or obstruction.  No evidence of postrenal obstruction.  While in the ED, the following were administered: Acetaminophen  650 mg p.o. x 1 dose, Reglan  5 mg IV x 1 dose, potassium chloride  40 mill colons p.o. x 1 dose, lactated Ringer 's at 500 cc/h followed by initiation  continuous LR running at 75 cc/h.  Subsequently, the patient was admitted for further evaluation and management of suspected gastro enterocolitis with presenting labs notable for acute kidney injury, hypokalemia after presenting for evaluation of a single presyncopal episode resulting in ground-level fall as well as initial hypotension that is improved with IV fluids.     Review of Systems: As per HPI otherwise 10 point review of systems negative.   Past Medical History:  Diagnosis Date   Abdominal hernia    per pt   Arthritis    hands   Benign localized prostatic hyperplasia with lower urinary tract symptoms (LUTS)    CAD (coronary artery disease) 05/2004   cardiologist--- dr anner;  positive stress test , had outpt cardiac cath 05-27-2004 stenosis RI;  06-18-2004 cath w/ PCI and BMS x1 to pRI;   STEMI 02-16-2009  s/p cath w/ PCI thrombectomy for total occluded RCA , BMS x1 to pRCA;  nuclear stress test 03-15-2012 low risk no ischemia but sig inferior-inferolateral infarct , ef 51%   Dyslipidemia    Essential hypertension    Heart murmur    History of COVID-19 05/2020   per pt mild symptoms that resolved   History of ST elevation myocardial infarction (STEMI) 02/16/2009   inferior STEMI   cath w/ pci w/ BMS to pRCA   History of urinary retention    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[facility]) 04/2021   urologist--- dr borden/ radiation oncology-- dr patrcia;  dx 12/ 2022,  Gleason 4+5, PSA 24.6, volume 94.6cc;  plan to start IMRT   OSA (obstructive sleep apnea)    per pt dx approx 2008, no cpap intolerant   PONV (postoperative nausea and vomiting)    Pre-diabetes    S/p bare metal coronary artery stent    01/ 2006  x1 BMS to pRI (CoStar study stent);  and 09/ 2010  x1 BMS to pRCA   Status post total knee replacement, 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    Past Surgical History:  Procedure Laterality Date   BOWEL DECOMPRESSION N/A 02/18/2024   Procedure: DECOMPRESSION, INTESTINE;  Surgeon: Albertus Gordy HERO, MD;  Location: [facility] ENDOSCOPY;  Service: Gastroenterology;  Laterality: N/A;   CARDIAC CATHETERIZATION  05/27/2004   outpatient by dr burnard [facility] cardiology) for positive stress test;  80% stensis pRI   CARDIOVERSION N/A 02/21/2024   Procedure: CARDIOVERSION;  Surgeon: Cherrie Toribio SAUNDERS, MD;  Location: [facility] INVASIVE CV LAB;  Service: Cardiovascular;  Laterality: N/A;   COLONOSCOPY N/A 02/18/2024   Procedure: COLONOSCOPY;  Surgeon: Albertus Gordy HERO, MD;  Location: [facility] ENDOSCOPY;  Service: Gastroenterology;  Laterality: N/A;   CORONARY ANGIOPLASTY WITH STENT PLACEMENT  06/18/2004   @[facility]  by Dr Lavon;  PCI w/ BMS to pRI  (CoStar study stent - 2.5 x 16)   CORONARY ANGIOPLASTY WITH STENT PLACEMENT  02/16/2009   @[facility]  by dr verlin;   Inferior STEMI:  PCI w/ thrombectomy total occluded RCA, BMS x1 to pRCA (Vision BMS 3.5 x 23 -> 4.0 mm), residual 40-50% pLAD, ef 45-50%   CORONARY PRESSURE/FFR STUDY N/A 01/17/2024   Procedure: CORONARY PRESSURE/FFR STUDY;  Surgeon: Mady Bruckner, MD;  Location: [facility] INVASIVE CV LAB;  Service: Cardiovascular;  Laterality: N/A;   CORONARY STENT INTERVENTION N/A 01/17/2024   Procedure: CORONARY STENT INTERVENTION;  Surgeon: Mady Bruckner, MD;  Location: [facility] INVASIVE CV LAB;  Service: Cardiovascular;  Laterality: N/A;   CORONARY ULTRASOUND/IVUS N/A 01/17/2024   Procedure: Coronary Ultrasound/IVUS;  Surgeon: Mady Bruckner, MD;  Location: [facility] INVASIVE CV LAB;  Service: Cardiovascular;  Laterality: N/A;   CORONARY/GRAFT ACUTE MI REVASCULARIZATION N/A 02/01/2024   Procedure: Coronary/Graft Acute MI Revascularization;  Surgeon: Elmira Newman PARAS, MD;  Location: [facility] INVASIVE CV LAB;  Service: Cardiovascular;  Laterality: N/A;   ESOPHAGOGASTRODUODENOSCOPY N/A 02/02/2024   Procedure: EGD (ESOPHAGOGASTRODUODENOSCOPY);  Surgeon: Nandigam, Kavitha V, MD;  Location: [facility] ENDOSCOPY;  Service: Gastroenterology;  Laterality: N/A;   FLEXIBLE SIGMOIDOSCOPY N/A 10/20/2023   Procedure:  [place] SIDE;  Surgeon: Legrand Victory LITTIE DOUGLAS, MD;  Location: [facility] ENDOSCOPY;  Service: Gastroenterology;  Laterality: N/A;   FLEXIBLE SIGMOIDOSCOPY N/A 02/02/2024   Procedure: SIGMOIDOSCOPY, FLEXIBLE;  Surgeon: Shila Gustav GAILS, MD;  Location: [facility] ENDOSCOPY;  Service: Gastroenterology;  Laterality: N/A;   GOLD SEED IMPLANT N/A 09/19/2021   Procedure: GOLD SEED IMPLANT;  Surgeon: Cam Morene ORN, MD;  Location: [facility];  Service: Urology;  Laterality: N/A;  ONLY NEEDS 30 MIN FOR ALL   INGUINAL HERNIA REPAIR Right 09/02/1999   @[facility] ;   recurrent repair right inguinal hernia;   previous repair approx 1990s   INGUINAL HERNIA REPAIR Left    1990s   KNEE ARTHROSCOPY W/ MENISCAL REPAIR Right 12/18/2005   @[facility]    LUMBAR DISC SURGERY  1982   L5--S1   RIGHT/LEFT HEART CATH AND CORONARY ANGIOGRAPHY N/A 01/17/2024   Procedure: RIGHT/LEFT HEART CATH AND CORONARY ANGIOGRAPHY;  Surgeon: Mady Bruckner, MD;  Location: [facility] INVASIVE CV LAB;  Service: Cardiovascular;  Laterality: N/A;  SPACE OAR INSTILLATION N/A 09/19/2021   Procedure: SPACE OAR INSTILLATION;  Surgeon: Cam Morene ORN, MD;  Location: [facility];  Service: Urology;  Laterality: N/A;   TOTAL KNEE ARTHROPLASTY Left 10/01/2023   Procedure: ARTHROPLASTY, KNEE, TOTAL;  Surgeon: Kay Kemps, MD;  Location: [facility] ORS;  Service: Orthopedics;  Laterality: Left;   TRANSESOPHAGEAL ECHOCARDIOGRAM (CATH LAB) N/A 02/21/2024   Procedure: TRANSESOPHAGEAL ECHOCARDIOGRAM;  Surgeon: Cherrie Toribio SAUNDERS, MD;  Location: [facility] INVASIVE CV LAB;  Service: Cardiovascular;  Laterality: N/A;    Social History:  reports that he has never smoked. He has never used smokeless tobacco. He reports that he does not drink alcohol and does not use drugs.   Allergies  Allergen Reactions   Codeine Nausea And Vomiting   Latex Rash    Oral blisters when used at dentist    Family History  Problem Relation Age of Onset   Lung cancer  Mother    Colon cancer Neg Hx    Esophageal cancer Neg Hx     Family history reviewed and not pertinent    Prior to Admission medications   Medication Sig Start Date End Date Taking? Authorizing Provider  amiodarone  (PACERONE ) 200 MG tablet Take 1 tablet (200 mg total) by mouth daily. 04/07/24   Milford, Harlene HERO, FNP  apixaban  (ELIQUIS ) 5 MG TABS tablet Take 1 tablet (5 mg total) by mouth 2 (two) times daily. 03/23/24   Colletta Manuelita Garre, PA-C  CALCIUM  PO Take 500 mg by mouth daily.    [provider]  clopidogrel  (PLAVIX ) 75 MG tablet Take 1 tablet (75 mg total) by mouth daily. 03/23/24   Colletta Manuelita Garre, PA-C  cyanocobalamin  (VITAMIN B12) 1000 MCG tablet Take 1,000 mcg by mouth daily.    [provider]  digoxin  (LANOXIN ) 0.125 MG tablet Take 1/2 tablet (0.0625 mg total) by mouth daily. 03/23/24   Colletta Manuelita Garre, PA-C  empagliflozin  (JARDIANCE ) 10 MG TABS tablet Take 1 tablet (10 mg total) by mouth daily before breakfast. 03/10/24   Lee, Jordan, NP  furosemide  (LASIX ) 40 MG tablet Take 1 tablet (40 mg total) by mouth 2 (two) times daily. 04/07/24 07/06/24  Glena Harlene HERO, FNP  Multiple Vitamins-Minerals (MULTIVITAMIN MEN 50+) TABS Take 1 tablet by mouth daily.    [provider]  nitroGLYCERIN  (NITROSTAT ) 0.4 MG SL tablet Place 0.4 mg under the tongue every 5 (five) minutes as needed for chest pain. 01/19/24   [provider]  ondansetron  (ZOFRAN ) 8 MG tablet Take 8 mg by mouth every 8 (eight) hours as needed for nausea or vomiting. 02/29/24   [provider]  pantoprazole  (PROTONIX ) 40 MG tablet Take 1 tablet (40 mg total) by mouth daily. 02/24/24   Arrien, Mauricio Daniel, MD  potassium chloride  SA (KLOR-CON  M) 20 MEQ tablet Take 1 tablet (20 mEq total) by mouth 2 (two) times daily. 04/07/24   Glena Harlene HERO, FNP  Probiotic Product (PROBIOTIC PO) Take 1,000 mg by mouth daily. Chew    [provider]  rosuvastatin   (CRESTOR ) 20 MG tablet TAKE 1 TABLET(20 MG) BY MOUTH DAILY 11/29/23   Anner Alm ORN, MD  sacubitril -valsartan  (ENTRESTO ) 24-26 MG Take 1 tablet by mouth 2 (two) times daily. 04/07/24   Glena Harlene HERO, FNP  sertraline (ZOLOFT) 50 MG tablet Take 25-50 mg by mouth See admin instructions. Taking 25 mg for 7 day and 50 mg daily after    [provider]  spironolactone  (ALDACTONE ) 25 MG tablet Take  0.5 tablets (12.5 mg total) by mouth daily. 03/14/24 06/12/24  Glena Harlene HERO, FNP  tamsulosin  (FLOMAX ) 0.4 MG CAPS capsule Take 1 capsule (0.4 mg total) by mouth daily after supper. 11/17/21   Patrcia Cough, MD     Objective    Physical Exam: Vitals:   04/21/24 2330 04/22/24 0000 04/22/24 0117 04/22/24 0207  BP: (!) 95/58 (!) 96/49 (!) 106/49   Pulse: 77 81 81 84  Resp: 19 (!) 25 20 (!) 26  Temp:   99.3 F (37.4 C)   TempSrc:   Oral   SpO2: 94% 93% 99% 93%  Weight:    83.5 kg  Height:        General: appears to be stated age; alert Skin: warm, dry, no rash Head:  AT/[place] Mouth:  Oral mucosa membranes appear dry, normal dentition Neck: supple; trachea midline Heart:  RRR; 2/6 holosystolic murmur Lungs: CTAB, did not appreciate any wheezes, rales, or rhonchi Abdomen: + BS; soft, ND, NT Vascular: 2+ pedal pulses b/l; 2+ radial pulses b/l Extremities: trace edema in b/l LE's, no muscle wasting        Labs on Admission: I have personally reviewed following labs and imaging studies  CBC: Recent Labs  Lab 04/21/24 1951  WBC 3.3*  HGB 12.7*  HCT 38.4*  MCV 91.6  PLT 364   Basic Metabolic Panel: Recent Labs  Lab 04/21/24 1951  NA 134*  K 3.0*  CL 93*  CO2 22  GLUCOSE 132*  BUN 38*  CREATININE 1.65*  CALCIUM  9.9  MG 1.8   GFR: Estimated Creatinine Clearance: 41.8 mL/min (A) (by C-G formula based on SCr of 1.65 mg/dL (H)). Liver Function Tests: Recent Labs  Lab 04/21/24 1951  AST 22  ALT 20  ALKPHOS 86  BILITOT 0.8  PROT 7.3  ALBUMIN 4.1   No  results for input(s): LIPASE, AMYLASE in the last 168 hours. No results for input(s): AMMONIA in the last 168 hours. Coagulation Profile: Recent Labs  Lab 04/21/24 1951  INR 1.7*   Cardiac Enzymes: No results for input(s): CKTOTAL, CKMB, CKMBINDEX, TROPONINI in the last 168 hours. BNP (last 3 results) Recent Labs    01/07/24 2301  PROBNP 15,245.0*   HbA1C: No results for input(s): HGBA1C in the last 72 hours. CBG: Recent Labs  Lab 04/21/24 2044  GLUCAP 142*   Lipid Profile: No results for input(s): CHOL, HDL, LDLCALC, TRIG, CHOLHDL, LDLDIRECT in the last 72 hours. Thyroid  Function Tests: No results for input(s): TSH, T4TOTAL, FREET4, T3FREE, THYROIDAB in the last 72 hours. Anemia Panel: No results for input(s): VITAMINB12, FOLATE, FERRITIN, TIBC, IRON, RETICCTPCT in the last 72 hours. Urine analysis:    Component Value Date/Time   COLORURINE AMBER (A) 01/22/2024 1700   APPEARANCEUR HAZY (A) 01/22/2024 1700   LABSPEC 1.020 01/22/2024 1700   PHURINE 5.0 01/22/2024 1700   GLUCOSEU NEGATIVE 01/22/2024 1700   HGBUR NEGATIVE 01/22/2024 1700   BILIRUBINUR NEGATIVE 01/22/2024 1700   KETONESUR NEGATIVE 01/22/2024 1700   PROTEINUR NEGATIVE 01/22/2024 1700   UROBILINOGEN 0.2 02/17/2009 0645   NITRITE NEGATIVE 01/22/2024 1700   LEUKOCYTESUR NEGATIVE 01/22/2024 1700    Radiological Exams on Admission: CT ABDOMEN PELVIS WO CONTRAST Result Date: 04/21/2024 CLINICAL DATA:  Bowel obstruction suspected. EXAM: CT ABDOMEN AND PELVIS WITHOUT CONTRAST TECHNIQUE: Multidetector CT imaging of the abdomen and pelvis was performed following the standard protocol without IV contrast. RADIATION DOSE REDUCTION: This exam was performed according to the departmental dose-optimization program which includes automated  exposure control, adjustment of the mA and/or kV according to patient size and/or use of iterative reconstruction technique.  COMPARISON:  CT abdomen and pelvis 02/14/2024. FINDINGS: Lower chest: No acute abnormality. Hepatobiliary: No focal liver abnormality is seen. No gallstones, gallbladder wall thickening, or biliary dilatation. Pancreas: Unremarkable. No pancreatic ductal dilatation or surrounding inflammatory changes. Spleen: Normal in size without focal abnormality. Adrenals/Urinary Tract: Low-density right adrenal nodule measures 12 Hounsfield units and appears unchanged (10 mm). The left adrenal gland is within normal limits. There is a single 3 mm calculus in the right kidney. Bilateral renal cysts appear unchanged. Subcentimeter rounded hyperdensity in the left cyst is also unchanged, possibly a complex cyst. There is no hydronephrosis. Anterior right bladder diverticulum present. Stomach/Bowel: There is diffuse mild dilatation of the colon without definitive transition point. Air-fluid levels are seen throughout the colon. There is no wall thickening or surrounding inflammation. There is no pneumatosis or free air. The appendix is not seen. Additionally there is fluid distention of ileal loops. There is a moderate air-fluid level in the stomach. There is sigmoid colon diverticulosis. Vascular/Lymphatic: Aortic atherosclerosis. No enlarged abdominal or pelvic lymph nodes. Reproductive: The prostate gland is enlarged. Radiotherapy seeds are present. There is nodular protrusion into the bladder base which is rounded measuring 19 mm as seen on prior. Other: Fat containing umbilical hernia is unchanged.  No ascites. Musculoskeletal: No fracture is seen. IMPRESSION: 1. Diffuse mild dilatation of the colon with air-fluid levels. No definitive transition point. Findings may be related to ileus or colitis. 2. Fluid distention of ileal loops and stomach. 3. Nonobstructing right renal calculus. 4. Stable right adrenal nodule, likely an adenoma. 5. Stable bilateral renal cysts. 6. Stable nodular protrusion of the prostate gland into the  bladder base. 7. Aortic atherosclerosis. Aortic Atherosclerosis (ICD10-I70.0). Electronically Signed   By: Greig Pique M.D.   On: 04/21/2024 21:25   DG Chest Port 1 View Result Date: 04/21/2024 CLINICAL DATA:  Shortness of breath. EXAM: PORTABLE CHEST 1 VIEW COMPARISON:  February 08, 2024 FINDINGS: The left-sided PICC line seen on the prior study has been removed. The cardiac silhouette is mildly enlarged and unchanged in size. Low lung volumes are noted. There is mild right perihilar linear scarring and/or atelectasis. No acute infiltrate, pleural effusion or pneumothorax is identified. Air-filled loops of large bowel is seen within the visualized portion of the upper abdomen. Marked severity degenerative changes are seen involving the right shoulder. The visualized skeletal structures are otherwise unremarkable. IMPRESSION: Low lung volumes with mild right perihilar linear scarring and/or atelectasis. Electronically Signed   By: Suzen Dials M.D.   On: 04/21/2024 19:58   CT Cervical Spine Wo Contrast Result Date: 04/21/2024 EXAM: CT CERVICAL SPINE WITHOUT CONTRAST 04/21/2024 07:45:03 PM TECHNIQUE: CT of the cervical spine was performed without the administration of intravenous contrast. Multiplanar reformatted images are provided for review. Automated exposure control, iterative reconstruction, and/or weight based adjustment of the mA/kV was utilized to reduce the radiation dose to as low as reasonably achievable. COMPARISON: None available. CLINICAL HISTORY: Neck trauma (Age >= 65y) FINDINGS: CERVICAL SPINE: BONES AND ALIGNMENT: No acute fracture or traumatic malalignment. DEGENERATIVE CHANGES: Moderate disc space height loss and degenerative endplate changes at C3-C4, C4-C5, and C5-C6. Posterior disc osteophyte complexes at these levels causing mild effacement of the ventral thecal sac. No severe spinal canal narrowing. SOFT TISSUES: No prevertebral soft tissue swelling. Right thyroid  nodules  measuring up to 1.6 cm. IMPRESSION: 1. No acute abnormality of the cervical  spine . 2. Incidental right thyroid  nodule measuring up to 1.6 cm. Recommend non-emergent thyroid  ultrasound per ACR guidelines. Electronically signed by: Norman Gatlin MD 04/21/2024 07:57 PM EST RP Workstation: [id number]   CT Head Wo Contrast Result Date: 04/21/2024 EXAM: CT HEAD WITHOUT 04/21/2024 07:45:03 PM TECHNIQUE: CT of the head was performed without the administration of intravenous contrast. Automated exposure control, iterative reconstruction, and/or weight based adjustment of the mA/kV was utilized to reduce the radiation dose to as low as reasonably achievable. COMPARISON: 12/ 29 / 11. CLINICAL HISTORY: Head trauma, minor (Age >= 65y) FINDINGS: BRAIN AND VENTRICLES: No acute intracranial hemorrhage. No mass effect or midline shift. No extra-axial fluid collection. No evidence of acute infarct. No hydrocephalus. 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 Mild periventricular white matter disease. Moderate calcific atheromatous disease within carotid siphons and vertebral arteries. ORBITS: No acute abnormality. Bilateral lens replacement noted. SINUSES AND MASTOIDS: No acute abnormality. SOFT TISSUES AND SKULL: No acute skull fracture. No acute soft tissue abnormality. IMPRESSION: 1. No acute intracranial abnormality. Electronically signed by: Norman Gatlin MD 04/21/2024 07:54 PM EST RP Workstation: [id number]      Assessment/Plan   Principal Problem:   Enterocolitis Active Problems:   Chronic systolic CHF (congestive heart failure) (HCC)   Hypokalemia   HLD (hyperlipidemia)   AKI (acute kidney injury)   DM2 (diabetes mellitus, type 2) (HCC)   Postural dizziness with presyncope   Elevated troponin   Fall at home, initial encounter   Hypotension   Paroxysmal atrial flutter (HCC)   Anemia of chronic disease        #) Gastroenterocolitis: The patient's reported sequence and duration of symptoms  appeared consistent with gastroenterocolitis, noting that initially he developed nausea/vomiting followed by development of loose watery stool associate with some crampy abdominal discomfort.  Additionally, today CT of/pelvis appears consistent with this, noting diffuse mild dilation of the colon suggestive of colitis, will sedating distention of the ileal loops and stomach, without any evidence of bowel obstruction, perforation, or abscess.  No history of infiltrative bowel disease, and the self-limited nature of the patient's symptoms also speaks against inflammatory bowel disease.  Distribution also speaks against ischemic colitis, also noting that the patient denies any recent hematochezia.  No known history of any autoimmune disease.  Patient's gastroenterocolitis appears to be self-limited, noting improvement in the above symptoms, with most recent episode of loose stool occurring now 6 hours ago, which is also coincided with resolution of his abdominal discomfort.  He continues to produce flatus, and in the context of concomitant resolution of nausea/vomiting as well as abdominal discomfort, presentation appears less suggestive of bowel obstruction.  While his gastroenterocolitis appears self-limited, we will consider pursuit stool studies should he experience recurrence of the above GI symptoms.  Will refrain from initiation of IV antibiotics at this time.  It appears that his gastroenterocolitis has been complicated by ensuing development of dehydration resulting in acute kidney injury as well as intravascular depletion that has contributed to his single episode of presyncope, as further detailed below.  Given finding of nodule on thyroid , will also check TSH level.  Plan: Gentle IV fluids in the form of lactated Ringer 's at 14 cc/h.  Monitor strict I's and O's and daily weights.  BMP in the morning.  Repeat CMP, CBC in the morning.  Check TSH level.  As needed IV Zofran .  Monitor on telemetry.  N.p.o.  with sips of clears, advance as tolerated.                      #)  Hypokalemia: presenting potassium level noted to be 3.0, with contributions from recent increase in GI losses, as above.  He has received 40 mill colons of oral potassium chloride  at [facility].  In the context of concomitant acute kidney injury, we will refrain from additional potassium supplementation for now, but rather monitor closely for updated serum potassium level with morning labs, as below.  Additionally, presenting magnesium  level is 1.8.  Will optimize this magnesium  level as below.  Plan: monitor on tele.  Magnesium  sulfate 1 g IV over 1 hour. CMP, mag level in the AM.                         #) Acute Kidney Injury:   Limited to the patient's baseline creatinine range of 0.9-1.2, with most recent prior creatinine data point noted to be 1.14 on 03/07/2024, his presenting creatinine is elevated to 1.65, which appears to be prerenal in nature as resulted intravascular depletion stemming from recent increase in GI losses in the form of nausea, vomiting, diarrhea as a result of suspected gastroenterocolitis over the last 1 to 2 days, with associated decline in oral intake over that timeframe, compounded by patient's continuation of good compliance with his outpatient diuretic regimen.  Appears consistent with concomitant finding of acute prerenal azotemia, as quantified above.  Of note, this evening CT Abdo/pelvis showed no evidence of postrenal obstruction.  He has received a 500 cc LR bolus at [facility] earlier today.  Will proceed with gentle IV fluids, with close monitoring for development of acute volume overload given his systolic heart failure.  Plan: monitor strict I's & O's and daily weights. Attempt to avoid nephrotoxic agents.  Hold next doses of spironolactone  and Lasix .  Hold next dose of Entresto .  Refrain from NSAIDs. Repeat CMP in the morning. Check serum magnesium  level.   Check urinalysis with microscopy.  Add-on random urine sodium and random urine creatinine.  Bacteria is at 75 cc/h.                       #) Chronically elevated troponin: Documented history of such, with baseline troponin noted to be in the range of 38-54 over the last 6 months, with most recent prior high sensitive troponin I data point noted to be 51 on 01/07/2024.  Initial troponin this evening was 71, with repeat trending down to 67.  Slight interval increase in presenting troponin relative to most recent prior values from August 2025 may be on the basis of generalized hypoperfusion in the context of initial systemic hypotension, as well as suspected contribution from diminished renal clearance of troponin as a result of presenting acute kidney injury.  ACS is felt to be less likely in the absence of any recent chest pain, while presenting EKG shows no evidence of acute ischemic changes, including no evidence of ST elevation.  Plan: Monitor on telemetry.                      #) Presyncopal episode: 1 episode of pre-syncope that occurred when the patient was moving from a seated to standing position and associated with dizziness, lightheadedness, and the subjective sensation of impending loss of consciousness, but without any overt ensuing syncope.  This presentation appears suggestive of orthostatic hypotension in the setting of dehydration from recent nausea, vomiting, diarrhea with concomitant decline in oral intake, as above, and exacerbated by home pharmacologic factors, including diminished compensatory peripheral vasoconstrictive response  in the setting of outpatient Entresto , with further preload reduction on spironolactone  and Lasix .  Will check orthostatic vital signs, but with the caveat that the patient has already received IVF's in the ED, potentially altering the results of this evaluation.    Not associated with any overt acute focal neurologic  deficits. Clinically, acute ischemic stroke versus seizures appear less likely at this time. Presentation appears less consistent with ACS at this time, as further detailed above   Plan: I have placed a nursing communication order requesting that orthostatic vital signs x 1 set be checked and documented. LR at 75 cc/hr, as above.  Monitor on telemetry.  CMP, CBC, magnesium  level in the morning.  Fall precautions ordered.                      #) ground-level fall: As a consequence of the patient's presenting presyncopal episode, resulting in ground-level fall in which the patient struck his head, but did not lose consciousness.  He is chronically anticoagulated on Eliquis  in the setting of a history of paroxysmal atrial flutter.  Ensuing CT head showed no evidence of acute intracranial process, including no evidence of intracranial hemorrhage.  No evidence of acute traumatic findings or associated radiographic evaluation.  Plan: Further evaluation management of presyncopal episode as well as presenting acute kidney injury, as above.  Fall precautions ordered.                      #) Hypotension: Initial systolic blood pressures noted to be in the 70s to 80s, subsequently improving with IV fluids, with systolic blood pressures now appearing to be back to his baseline values in the low 100s mmHg. suspect that his initial relative hypotension was hypovolemic in nature as a result of intravascular depletion/dehydration from recent nausea, vomiting, diarrhea, with concomitant decline in oral intake over that timeframe, with further decline in preload via outpatient spironolactone  and Lasix , all superimposed on his antihypertensive regimen that includes Entresto .  Is also noted to be on digoxin .  Will check serum digoxin  level, including given his presenting nausea, vomiting, diarrhea.  His suspected underlying gastroenterocolitis appears self-limited, as above.  no evidence  of contributory additional underlying infectious process at this time, including chest x-ray which showed no evidence of infiltrate to suggest pneumonia. Will also follow-up for result of urinalysis.  Hemoglobin appears to be at baseline.  Acute pulmonary embolism appears clinically less likely, particular given that the patient is chronically anticoagulated on Eliquis .  Plan: Gentle IV fluids, as above.  Hold next doses of spironolactone  and Lasix , as above.  Monitor strict I's and O's and daily weights.  CMP, CBC in the morning.  Check digoxin  level.  Follow-up result urinalysis.  Check BNP as we continue to monitor closely considering volume status.  Add on procalcitonin level.                       #) Right thyroid  nodule: Noted incidentally on this evening's CT cervical spine, with nodule in noted to measure up to 1.6 cm.  Per associated read, radiology recommends a nonemergent thyroid  ultrasound to further evaluate.  Plan: wil convey radiology's recommendation forl nonemergent thyroid  ultrasound.                          #) Chronic biventricular systolic heart failure: documented history of such, with most recent TTE performed on 02/02/2024, which showed  LVEF 20 to 25%, moderately reduced right ventricular systolic function, as well as additional findings as outlined above. No clinical or radiographic evidence to suggest acutely decompensated heart failure at this time. home diuretic regimen reportedly consists of the following: Spironolactone  and Lasix . Home cardiac medications also include the following: Entresto .  In the setting of presenting acute kidney injury, will hold home Entresto  for now.  Additionally, in setting of clinical evidence of intravascular depletion resulting in acute kidney injury, will also hold next doses of spironolactone  and Lasix .  Close monitor for ensuing evidence to suggest development of acute volume overload, as below.  Plan:  monitor strict I's & O's and daily weights. Repeat BMP in AM. Check serum mag level.  Hold next doses of Entresto , spironolactone , and Lasix , as above.  Check BNP.                          #) Paroxysmal atrial flutter: Documented history of such.  Status post TEE with electrocardioversion on 02/21/2024 in setting of CHA2DS2-VASc score of 5, there is an indication for chronic anticoagulation for thromboembolic prophylaxis. Consistent with this, patient is chronically anticoagulated on Eliquis . Home AV nodal blocking regimen: Digoxin .  Additionally, he is on rhythm control strategy with outpatient amiodarone .  Most recent TTE was performed on 02/02/2024, with results as conveyed above. Presenting EKG demonstrates sinus rhythm without overt evidence of acute ischemic changes, as above..   Plan: monitor strict I's & O's and daily weights. CMP/CBC in AM. Check serum mag level. Will check digoxin  level prior to resumption of this medication.  Continue outpatient Eliquis .  Continue outpatient amiodarone .  Monitor on telemetry.                       #) Hyperlipidemia: documented h/o such. On high intensity rosuvastatin  as outpatient.   Plan: continue home statin.                        #) Type 2 Diabetes Mellitus: documented history of such. Home insulin  regimen: None. Home oral hypoglycemic agents: Empagliflozin , which is also notable as medical management in the setting of his chronic systolic heart failure. presenting blood sugar: 132. Most recent A1c noted to be 6.4% when checked on 09/22/2023.  Plan: In the setting of current n.p.o. status with sips of clears, will pursue Accu-Cheks every 6 hours with low-dose sliding scale insulin .  Add on hemoglobin A1c level.                      #) Anemia of chronic disease: Documented history of such, a/w with baseline hgb range 8-12, with presenting hgb slightly elevated relative to  this baseline range, which appears consistent with an element of hemoconcentration in the context of clinical evidence of underlying dehydration from recent GI losses, as further detailed above.  No evidence to suggest active bleed at this time.  Plan: Repeat CBC in the morning.         DVT prophylaxis: SCD's continuation of outpatient Eliquis  Code Status: DNR/DNI,   Consistent with active DNR form and also consistent with code status documentation from most recent prior hospitalization Family Communication: none Disposition Plan: Per Rounding Team Consults called: none;  Admission status: inpatient     I SPENT GREATER THAN 75  MINUTES IN CLINICAL CARE TIME/MEDICAL DECISION-MAKING IN COMPLETING THIS ADMISSION.      Dana Debo B Arlis Everly DO  [facility]  From 7PM - 7AM   04/22/2024, 3:26 AM

## 2024-04-23 ENCOUNTER — Inpatient Hospital Stay (HOSPITAL_COMMUNITY)

## 2024-04-23 LAB — BASIC METABOLIC PANEL WITH GFR
Anion gap: 13 (ref 5–15)
BUN: 79 mg/dL — ABNORMAL HIGH (ref 8–23)
CO2: 18 mmol/L — ABNORMAL LOW (ref 22–32)
Calcium: 8 mg/dL — ABNORMAL LOW (ref 8.9–10.3)
Chloride: 101 mmol/L (ref 98–111)
Creatinine, Ser: 2.17 mg/dL — ABNORMAL HIGH (ref 0.61–1.24)
GFR, Estimated: 31 mL/min — ABNORMAL LOW (ref >60)
Glucose, Bld: 90 mg/dL (ref 70–99)
Potassium: 3.2 mmol/L — ABNORMAL LOW (ref 3.5–5.1)
Sodium: 132 mmol/L — ABNORMAL LOW (ref 135–145)

## 2024-04-23 LAB — CBC WITH DIFFERENTIAL/PLATELET
Abs Immature Granulocytes: 0.07 K/uL (ref 0.00–0.07)
Basophils Absolute: 0.1 K/uL (ref 0.0–0.1)
Basophils Relative: 2 %
Eosinophils Absolute: 0 K/uL (ref 0.0–0.5)
Eosinophils Relative: 0 %
HCT: 38.2 % — ABNORMAL LOW (ref 39.0–52.0)
Hemoglobin: 12.4 g/dL — ABNORMAL LOW (ref 13.0–17.0)
Immature Granulocytes: 1 %
Lymphocytes Relative: 14 %
Lymphs Abs: 0.7 K/uL (ref 0.7–4.0)
MCH: 29.7 pg (ref 26.0–34.0)
MCHC: 32.5 g/dL (ref 30.0–36.0)
MCV: 91.6 fL (ref 80.0–100.0)
Monocytes Absolute: 1.2 K/uL — ABNORMAL HIGH (ref 0.1–1.0)
Monocytes Relative: 25 %
Neutro Abs: 2.9 K/uL (ref 1.7–7.7)
Neutrophils Relative %: 58 %
Platelets: 307 K/uL (ref 150–400)
RBC: 4.17 MIL/uL — ABNORMAL LOW (ref 4.22–5.81)
RDW: 18.8 % — ABNORMAL HIGH (ref 11.5–15.5)
Smear Review: NORMAL
WBC: 5 K/uL (ref 4.0–10.5)
nRBC: 0 % (ref 0.0–0.2)

## 2024-04-23 LAB — COMPREHENSIVE METABOLIC PANEL WITH GFR
ALT: 66 U/L — ABNORMAL HIGH (ref 0–44)
AST: 52 U/L — ABNORMAL HIGH (ref 15–41)
Albumin: 2.6 g/dL — ABNORMAL LOW (ref 3.5–5.0)
Alkaline Phosphatase: 60 U/L (ref 38–126)
Anion gap: 14 (ref 5–15)
BUN: 68 mg/dL — ABNORMAL HIGH (ref 8–23)
CO2: 18 mmol/L — ABNORMAL LOW (ref 22–32)
Calcium: 8.5 mg/dL — ABNORMAL LOW (ref 8.9–10.3)
Chloride: 100 mmol/L (ref 98–111)
Creatinine, Ser: 1.85 mg/dL — ABNORMAL HIGH (ref 0.61–1.24)
GFR, Estimated: 37 mL/min — ABNORMAL LOW (ref >60)
Glucose, Bld: 109 mg/dL — ABNORMAL HIGH (ref 70–99)
Potassium: 3 mmol/L — ABNORMAL LOW (ref 3.5–5.1)
Sodium: 132 mmol/L — ABNORMAL LOW (ref 135–145)
Total Bilirubin: 0.5 mg/dL (ref 0.0–1.2)
Total Protein: 5.7 g/dL — ABNORMAL LOW (ref 6.5–8.1)

## 2024-04-23 LAB — GASTROINTESTINAL PANEL BY PCR, STOOL (REPLACES STOOL CULTURE)

## 2024-04-23 LAB — GLUCOSE, CAPILLARY
Glucose-Capillary: 101 mg/dL — ABNORMAL HIGH (ref 70–99)
Glucose-Capillary: 107 mg/dL — ABNORMAL HIGH (ref 70–99)
Glucose-Capillary: 71 mg/dL (ref 70–99)
Glucose-Capillary: 87 mg/dL (ref 70–99)

## 2024-04-23 LAB — HEMOGLOBIN A1C
Hgb A1c MFr Bld: 5.5 % (ref 4.8–5.6)
Mean Plasma Glucose: 111 mg/dL

## 2024-04-23 LAB — MAGNESIUM: Magnesium: 2 mg/dL (ref 1.7–2.4)

## 2024-04-23 MED ORDER — SODIUM CHLORIDE 0.9 % IV SOLN: INTRAVENOUS | Status: AC

## 2024-04-23 MED ORDER — POTASSIUM CHLORIDE 10 MEQ/100ML IV SOLN
10.0000 meq | INTRAVENOUS | Status: AC
  Administered 2024-04-23 – 2024-04-24 (×4): 10 meq via INTRAVENOUS
  Filled 2024-04-23: qty 100

## 2024-04-23 MED ORDER — POTASSIUM CHLORIDE 10 MEQ/100ML IV SOLN
10.0000 meq | INTRAVENOUS | Status: AC
  Administered 2024-04-23 (×6): 10 meq via INTRAVENOUS
  Filled 2024-04-23 (×6): qty 100

## 2024-04-23 MED ORDER — DIGOXIN 125 MCG PO TABS
0.0625 mg | ORAL_TABLET | Freq: Every day | ORAL | Status: DC
  Administered 2024-04-23 – 2024-04-25 (×3): 0.0625 mg via ORAL
  Filled 2024-04-23 (×3): qty 1

## 2024-04-23 NOTE — Progress Notes (Addendum)
 PROGRESS NOTE    Chase Combs  FMW:[id number] DOB: April 17, 1946 DOA: 04/21/2024 PCP: Wonda Worth SQUIBB, PA  Subjective:  No acute events overnight. Seen and examined at bedside. Reports feeling slightly better. Having frequent loose stools that are now starting to get more formed. Denies nausea or vomiting or abdominal pain.    Hospital Course: 78 y.o. 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 Of note, patient had recent complicated admission for septic shock, cardiogenic shock, intestinal pneumatosis/ileus/pseudoobstruction/Ogilvie syndrome. Admitted for suspected gastroenterocolitis, AKI on CKD, hypotension and acute debilitation.    Assessment and Plan:  Presyncope Mechanical fall Hypotension Likely in the setting of dehydration/orthostatic hypotension from possible ileus/pseudoobstruction/gastroenterocolitis CT head unremarkable NPO for now  Hold BP meds as elsewhere Given LR in ED and 56ml/hr LR on admission Cont NS bolus then 135ml/hr Monitor on tele   Possible Ileus Recurrent Ogilvie Syndrome History of recent hospitalization (01/22/24-02/25/24) for similar GI symptoms with extensive workup with assistance from general surgery and GI services. Underwent colonic decompression (02/18/24) with improvement.  Presented with nausea, vomiting, diarrhea Remains afebrile and without leukocytosis C diff and enteric PCR panel  negative CT A/P w/o contrast with noted diffuse mild dilatation of colon, distended ileal loops and stomach Hold off on antibiotics for now Will keep NPO Zofran  PRN IV fluids as elsewhere Aspiration precautions May need rectal tube if diarrhea gets persistent May need repeat colonic decompression if doesn't improve Plan to give a few days to improve with conservative care as above May need GI consult if not improving   AKI on CKD Cr 1.85 < 2.1, baseline 0.9-1.5 Likely in the setting of dehydration from diarrhea CT A/P with no concern for obstructive disease IV fluids as elsewhere Hold home entresto , spironolactone , lasix  Place on external foley for strict I/Os Avoid nephrotoxic agents Renal dose medications Monitor BMP   Non-ischemic myocardial injury CAD Recent balloon angioplasty of LAD stent thrombosis (02/01/2024) due to inability to tolerate oral DAPT because of intractable nausea and vomiting History of remote PCI to RI and RCA EKG with known RBBB, LAFB, no significant changes from prior Troponin 67 < 71 Cont home plavix , eliquis  Will need to touch base with cardiology and switch to IV alternatives if unable to take plavix  and eliquis  orally Monitor on tele Monitor clinically   Acute hypoxia Noted to have cough, shortness of breath, R > Lung crackles on ED presentation. Concern for orthopnea also present Likely in the setting of abdominal distension from underlying ileus/pseudoobstruction/gastroenterocolitis Currently on 2L oxygen, not on oxygen at home See management of ileus as elsewhere Wean oxygen as tolerated    CHFrEF Recent prolonged hospitalization complicated by cardiogenic shock with acute CHF exacerbation in the setting of LAD stent stenosis  Last echo 01/2024 with LVEF 20-25% Not clinically fluid overloaded. In fact, appears hypovolemic on exam BNP better than prior labs Home home lasix , spironolactone , entresto  IV fluids as elsewhere Monitor I/Os, daily  weights, renal function, electrolytes   Paroxysmal Afib S/p TEE CV on 02/21/2024  Dig level 0.7 Cont home amiodarone  Resume home digoxin  as AKI improving Cont home eliquis  Recheck digoxin  level on 12/5, ordered Monitor on tele   Hyponatremia Na 132 < 135 < 129 Etiology unclear, likely hypovolemic in nature given ongoing diarrhea Switch LR to NS IV fluids as elsewhere Monitor BMP q12h   Peptic ulcer disease Recent hospitalization with EGD (02/02/24) showing esophageal ulcer without bleeding, grade C reflux esophagitis without bleeding, duodenal erosions At home on pantoprazole  40mg  daily Cont IV pantoprazole  while NPO Monitor for signs/symptoms of acute bleed   Hypokalemia Monitor and replete electrolytes as needed   Hypomagnesemia Monitor and replete electrolytes as needed   T2DM Hold home empagliflozin  Cont SSI with FS q6h while NPO   Incidental R thyroid  nodule Noted on CT C spine TSH normal Will need outpatient thyroid  US    Chronic anemia Hgb 11.2, baseline 10-12 Cont anticoagulation as elsewhere Monitor clinically   HLD Cont home statin   Acute debilitation Mechanical fall Monitor on fall precautions Will need PT/OT eval when more clinically stable   DVT prophylaxis: SCDs Start: 04/22/24 0137 apixaban  (ELIQUIS ) tablet 5 mg  Eliquis    Code Status: Limited: Do not attempt resuscitation (DNR) -DNR-LIMITED -Do Not Intubate/DNI  Disposition Plan: TBD Reason for continuing need for hospitalization: severity of illness  Objective: Vitals:   04/23/24 0459 04/23/24 0500 04/23/24 0738 04/23/24 0842  BP:   94/62 (!) 82/60  Pulse: 67  100 100  Resp: 18  (!) 21 (!) 24  Temp: 98 F (36.7 C)  97.7 F (36.5 C) 97.7 F (36.5 C)  TempSrc: Oral  Oral Oral  SpO2: 92%  95% 95%  Weight:  83.5 kg    Height:        Intake/Output Summary (Last 24 hours) at 04/23/2024 1030 Last data filed at 04/23/2024 1003 Gross per 24 hour  Intake 2183.78 ml  Output 530 ml   Net 1653.78 ml   Filed Weights   04/21/24 1841 04/22/24 0207 04/23/24 0500  Weight: 85 kg 83.5 kg 83.5 kg    Examination:  Physical Exam Vitals and nursing note reviewed.  Constitutional:      General: He is not in acute distress.    Appearance: He is ill-appearing.     Comments: Weak, frail  HENT:     Head: Normocephalic and atraumatic.  Cardiovascular:     Rate and Rhythm: Normal rate and regular rhythm.     Pulses: Normal pulses.     Heart sounds: Normal heart sounds.  Pulmonary:     Effort: Pulmonary effort is normal.     Breath sounds: Normal breath sounds.  Abdominal:     General: There is distension.     Palpations: Abdomen is soft. There is no mass.     Tenderness: There is no abdominal tenderness. There is no guarding or rebound.     Hernia: No hernia is present.  Musculoskeletal:     Right lower leg: No edema.     Left lower leg: No edema.  Neurological:     Mental Status: He is alert. Mental status is at baseline.     Data Reviewed: I have personally reviewed following labs and imaging studies  CBC: Recent Labs  Lab 04/21/24 1951 04/22/24 0331 04/23/24 0223  WBC 3.3* 4.7 5.0  NEUTROABS  --  2.9 2.9  HGB 12.7* 11.1* 12.4*  HCT 38.4* 33.3* 38.2*  MCV 91.6 90.2 91.6  PLT 364 309 307   Basic Metabolic Panel: Recent Labs  Lab 04/21/24 1951 04/22/24 0331 04/22/24 1351 04/23/24 0223  NA 134* 129* 135 132*  K 3.0* 3.2* 3.4* 3.0*  CL 93* 96* 101 100  CO2 22 23 19* 18*  GLUCOSE 132* 127* 104* 109*  BUN 38* 46* 59* 68*  CREATININE 1.65* 2.10* 2.05* 1.85*  CALCIUM  9.9 8.4* 8.5* 8.5*  MG 1.8 1.6* 2.0 2.0   GFR: Estimated Creatinine Clearance: 37.3 mL/min (A) (by C-G formula based on SCr of 1.85 mg/dL (H)). Liver Function Tests: Recent Labs  Lab 04/21/24 1951 04/22/24 0331 04/23/24 0223  AST 22 27 52*  ALT 20 25 66*  ALKPHOS 86 54 60  BILITOT 0.8 0.7 0.5  PROT 7.3 5.6* 5.7*  ALBUMIN 4.1 2.7* 2.6*   No results for input(s): LIPASE,  AMYLASE in the last 168 hours. No results for input(s): AMMONIA in the last 168 hours. Coagulation Profile: Recent Labs  Lab 04/21/24 1951  INR 1.7*   Cardiac Enzymes: No results for input(s): CKTOTAL, CKMB, CKMBINDEX, TROPONINI in the last 168 hours. ProBNP, BNP (last 5 results) Recent Labs    01/07/24 2301 01/11/24 0539 01/22/24 1243 03/09/24 1444 03/23/24 1456 04/07/24 1235 04/22/24 0607  PROBNP 15,245.0*  --   --   --   --   --   --   BNP  --    < > 220.4* 618.6* 1,985.4* 940.4* 844.7*   < > = values in this interval not displayed.   HbA1C: No results for input(s): HGBA1C in the last 72 hours. CBG: Recent Labs  Lab 04/22/24 0702 04/22/24 1229 04/22/24 1707 04/23/24 0010 04/23/24 0627  GLUCAP 123* 111* 110* 101* 107*   Lipid Profile: No results for input(s): CHOL, HDL, LDLCALC, TRIG, CHOLHDL, LDLDIRECT in the last 72 hours. Thyroid  Function Tests: Recent Labs    04/22/24 0609  TSH 1.029   Anemia Panel: No results for input(s): VITAMINB12, FOLATE, FERRITIN, TIBC, IRON, RETICCTPCT in the last 72 hours. Sepsis Labs: Recent Labs  Lab 04/21/24 2240 04/22/24 0607 04/22/24 0843  PROCALCITON  --  8.67  --   LATICACIDVEN 1.2  --  1.6    Recent Results (from the past 240 hours)  Resp panel by RT-PCR (RSV, Flu A&B, Covid) Anterior Nasal Swab     Status: None   Collection Time: 04/21/24  7:51 PM   Specimen: Anterior Nasal Swab  Result Value Ref Range Status   SARS Coronavirus 2 by RT PCR NEGATIVE NEGATIVE Final    Comment: (NOTE) SARS-CoV-2 target nucleic acids are NOT DETECTED.  The SARS-CoV-2 RNA is generally detectable in upper respiratory specimens during the acute phase of infection. The lowest concentration of SARS-CoV-2 viral copies this assay can detect is 138 copies/mL. A negative result does not preclude SARS-Cov-2 infection and should not be used as the sole basis for treatment or other patient management  decisions. A negative result may occur with  improper specimen collection/handling, submission of specimen other than nasopharyngeal swab, presence of viral mutation(s) within the areas targeted by this assay, and inadequate number of viral copies(<138 copies/mL). A negative result must be combined with clinical observations, patient history, and epidemiological information. The expected result is Negative.  Fact Sheet for Patients:  bloggercourse.com  Fact Sheet for Healthcare Providers:  seriousbroker.it  This test is no t yet approved or cleared by the [place]  FDA and  has been authorized for detection and/or diagnosis of SARS-CoV-2 by FDA under an Emergency Use Authorization (EUA). This EUA will remain  in effect (meaning this  test can be used) for the duration of the COVID-19 declaration under Section 564(b)(1) of the Act, 21 U.S.C.section 360bbb-3(b)(1), unless the authorization is terminated  or revoked sooner.       Influenza A by PCR NEGATIVE NEGATIVE Final   Influenza B by PCR NEGATIVE NEGATIVE Final    Comment: (NOTE) The Xpert Xpress SARS-CoV-2/FLU/RSV plus assay is intended as an aid in the diagnosis of influenza from Nasopharyngeal swab specimens and should not be used as a sole basis for treatment. Nasal washings and aspirates are unacceptable for Xpert Xpress SARS-CoV-2/FLU/RSV testing.  Fact Sheet for Patients: bloggercourse.com  Fact Sheet for Healthcare Providers: seriousbroker.it  This test is not yet approved or cleared by the [place]  FDA and has been authorized for detection and/or diagnosis of SARS-CoV-2 by FDA under an Emergency Use Authorization (EUA). This EUA will remain in effect (meaning this test can be used) for the duration of the COVID-19 declaration under Section 564(b)(1) of the Act, 21 U.S.C. section 360bbb-3(b)(1), unless the  authorization is terminated or revoked.     Resp Syncytial Virus by PCR NEGATIVE NEGATIVE Final    Comment: (NOTE) Fact Sheet for Patients: bloggercourse.com  Fact Sheet for Healthcare Providers: seriousbroker.it  This test is not yet approved or cleared by the [place]  FDA and has been authorized for detection and/or diagnosis of SARS-CoV-2 by FDA under an Emergency Use Authorization (EUA). This EUA will remain in effect (meaning this test can be used) for the duration of the COVID-19 declaration under Section 564(b)(1) of the Act, 21 U.S.C. section 360bbb-3(b)(1), unless the authorization is terminated or revoked.  Performed at [facility], [address] [place], [place] [address]   C Difficile Quick Screen w PCR reflex     Status: None   Collection Time: 04/22/24  6:33 PM   Specimen: STOOL  Result Value Ref Range Status   C Diff antigen NEGATIVE NEGATIVE Final   C Diff toxin NEGATIVE NEGATIVE Final   C Diff interpretation No C. difficile detected.  Final    Comment: Performed at [facility], [address] [place], [place] [address]  Gastrointestinal Panel by PCR , Stool     Status: None   Collection Time: 04/22/24  6:33 PM   Specimen: Stool  Result Value Ref Range Status   Campylobacter species NOT DETECTED NOT DETECTED Final   Plesimonas shigelloides NOT DETECTED NOT DETECTED Final   Salmonella species NOT DETECTED NOT DETECTED Final   Yersinia enterocolitica NOT DETECTED NOT DETECTED Final   Vibrio species NOT DETECTED NOT DETECTED Final   Vibrio cholerae NOT DETECTED NOT DETECTED Final   Enteroaggregative E coli (EAEC) NOT DETECTED NOT DETECTED Final   Enteropathogenic E coli (EPEC) NOT DETECTED NOT DETECTED Final   Enterotoxigenic E coli (ETEC) NOT DETECTED NOT DETECTED Final   Shiga like toxin producing E coli (STEC) NOT DETECTED NOT DETECTED Final   Shigella/Enteroinvasive E  coli (EIEC) NOT DETECTED NOT DETECTED Final   Cryptosporidium NOT DETECTED NOT DETECTED Final   Cyclospora cayetanensis NOT DETECTED NOT DETECTED Final   Entamoeba histolytica NOT DETECTED NOT DETECTED Final   Giardia lamblia NOT DETECTED NOT DETECTED Final   Adenovirus F40/41 NOT DETECTED NOT DETECTED Final   Astrovirus NOT DETECTED NOT DETECTED Final   Norovirus GI/GII NOT DETECTED NOT DETECTED Final   Rotavirus A NOT DETECTED NOT DETECTED Final   Sapovirus (I, II, IV, and V) NOT DETECTED NOT DETECTED Final    Comment: Performed at  [facility], [address], [place], [place] [address]     Radiology Studies: CT ABDOMEN PELVIS WO CONTRAST Result Date: 04/21/2024 CLINICAL DATA:  Bowel obstruction suspected. EXAM: CT ABDOMEN AND PELVIS WITHOUT CONTRAST TECHNIQUE: Multidetector CT imaging of the abdomen and pelvis was performed following the standard protocol without IV contrast. RADIATION DOSE REDUCTION: This exam was performed according to the departmental dose-optimization program which includes automated exposure control, adjustment of the mA and/or kV according to patient size and/or use of iterative reconstruction technique. COMPARISON:  CT abdomen and pelvis 02/14/2024. FINDINGS: Lower chest: No acute abnormality. Hepatobiliary: No focal liver abnormality is seen. No gallstones, gallbladder wall thickening, or biliary dilatation. Pancreas: Unremarkable. No pancreatic ductal dilatation or surrounding inflammatory changes. Spleen: Normal in size without focal abnormality. Adrenals/Urinary Tract: Low-density right adrenal nodule measures 12 Hounsfield units and appears unchanged (10 mm). The left adrenal gland is within normal limits. There is a single 3 mm calculus in the right kidney. Bilateral renal cysts appear unchanged. Subcentimeter rounded hyperdensity in the left cyst is also unchanged, possibly a complex cyst. There is no hydronephrosis. Anterior right bladder diverticulum  present. Stomach/Bowel: There is diffuse mild dilatation of the colon without definitive transition point. Air-fluid levels are seen throughout the colon. There is no wall thickening or surrounding inflammation. There is no pneumatosis or free air. The appendix is not seen. Additionally there is fluid distention of ileal loops. There is a moderate air-fluid level in the stomach. There is sigmoid colon diverticulosis. Vascular/Lymphatic: Aortic atherosclerosis. No enlarged abdominal or pelvic lymph nodes. Reproductive: The prostate gland is enlarged. Radiotherapy seeds are present. There is nodular protrusion into the bladder base which is rounded measuring 19 mm as seen on prior. Other: Fat containing umbilical hernia is unchanged.  No ascites. Musculoskeletal: No fracture is seen. IMPRESSION: 1. Diffuse mild dilatation of the colon with air-fluid levels. No definitive transition point. Findings may be related to ileus or colitis. 2. Fluid distention of ileal loops and stomach. 3. Nonobstructing right renal calculus. 4. Stable right adrenal nodule, likely an adenoma. 5. Stable bilateral renal cysts. 6. Stable nodular protrusion of the prostate gland into the bladder base. 7. Aortic atherosclerosis. Aortic Atherosclerosis (ICD10-I70.0). Electronically Signed   By: Greig Pique M.D.   On: 04/21/2024 21:25   DG Chest Port 1 View Result Date: 04/21/2024 CLINICAL DATA:  Shortness of breath. EXAM: PORTABLE CHEST 1 VIEW COMPARISON:  February 08, 2024 FINDINGS: The left-sided PICC line seen on the prior study has been removed. The cardiac silhouette is mildly enlarged and unchanged in size. Low lung volumes are noted. There is mild right perihilar linear scarring and/or atelectasis. No acute infiltrate, pleural effusion or pneumothorax is identified. Air-filled loops of large bowel is seen within the visualized portion of the upper abdomen. Marked severity degenerative changes are seen involving the right shoulder.  The visualized skeletal structures are otherwise unremarkable. IMPRESSION: Low lung volumes with mild right perihilar linear scarring and/or atelectasis. Electronically Signed   By: Suzen Dials M.D.   On: 04/21/2024 19:58   CT Cervical Spine Wo Contrast Result Date: 04/21/2024 EXAM: CT CERVICAL SPINE WITHOUT CONTRAST 04/21/2024 07:45:03 PM TECHNIQUE: CT of the cervical spine was performed without the administration of intravenous contrast. Multiplanar reformatted images are provided for review. Automated exposure control, iterative reconstruction, and/or weight based adjustment of the mA/kV was utilized to reduce the radiation dose to as low as reasonably achievable. COMPARISON: None available. CLINICAL HISTORY: Neck trauma (Age >= 65y) FINDINGS:  CERVICAL SPINE: BONES AND ALIGNMENT: No acute fracture or traumatic malalignment. DEGENERATIVE CHANGES: Moderate disc space height loss and degenerative endplate changes at C3-C4, C4-C5, and C5-C6. Posterior disc osteophyte complexes at these levels causing mild effacement of the ventral thecal sac. No severe spinal canal narrowing. SOFT TISSUES: No prevertebral soft tissue swelling. Right thyroid  nodules measuring up to 1.6 cm. IMPRESSION: 1. No acute abnormality of the cervical spine . 2. Incidental right thyroid  nodule measuring up to 1.6 cm. Recommend non-emergent thyroid  ultrasound per ACR guidelines. Electronically signed by: Norman Gatlin MD 04/21/2024 07:57 PM EST RP Workstation: [id number]   CT Head Wo Contrast Result Date: 04/21/2024 EXAM: CT HEAD WITHOUT 04/21/2024 07:45:03 PM TECHNIQUE: CT of the head was performed without the administration of intravenous contrast. Automated exposure control, iterative reconstruction, and/or weight based adjustment of the mA/kV was utilized to reduce the radiation dose to as low as reasonably achievable. COMPARISON: 12/ 29 / 11. CLINICAL HISTORY: Head trauma, minor (Age >= 65y) FINDINGS: BRAIN AND VENTRICLES: No  acute intracranial hemorrhage. No mass effect or midline shift. No extra-axial fluid collection. No evidence of acute infarct. No hydrocephalus. 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 Mild periventricular white matter disease. Moderate calcific atheromatous disease within carotid siphons and vertebral arteries. ORBITS: No acute abnormality. Bilateral lens replacement noted. SINUSES AND MASTOIDS: No acute abnormality. SOFT TISSUES AND SKULL: No acute skull fracture. No acute soft tissue abnormality. IMPRESSION: 1. No acute intracranial abnormality. Electronically signed by: Norman Gatlin MD 04/21/2024 07:54 PM EST RP Workstation: [id number]    Scheduled Meds:  amiodarone   200 mg Oral Daily   apixaban   5 mg Oral BID   clopidogrel   75 mg Oral Daily   insulin  aspart  0-6 Units Subcutaneous Q6H   pantoprazole  (PROTONIX ) IV  40 mg Intravenous Q24H   rosuvastatin   20 mg Oral Daily   Continuous Infusions:  sodium chloride      potassium chloride  10 mEq (04/23/24 1013)     LOS: 1 day   Norval Bar, MD  [facility]  04/23/2024, 10:30 AM

## 2024-04-23 NOTE — Hospital Course (Signed)
 78 y.o. 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 Of note, patient had recent complicated admission for septic shock, cardiogenic shock, intestinal pneumatosis/ileus/pseudoobstruction/Ogilvie syndrome. Admitted for suspected gastroenterocolitis, AKI on CKD, hypotension and acute debilitation.

## 2024-04-23 NOTE — Progress Notes (Signed)
 This RN noticed his HR in Afib on the monitor, 12 lead EKG also showed Afib RVR, rate is between 100-110, patient's BP on soft side, denies any symptoms, notified to the provider. POC continues.

## 2024-04-23 NOTE — Plan of Care (Signed)
  Problem: Nutrition: Goal: Adequate nutrition will be maintained Outcome: Progressing   Problem: Pain Managment: Goal: General experience of comfort will improve and/or be controlled Outcome: Progressing   Problem: Safety: Goal: Ability to remain free from injury will improve Outcome: Progressing   Problem: Fluid Volume: Goal: Ability to maintain a balanced intake and output will improve Outcome: Progressing   Problem: Metabolic: Goal: Ability to maintain appropriate glucose levels will improve Outcome: Progressing

## 2024-04-23 NOTE — Progress Notes (Signed)
 Patient's CBG was only 71 mg/dl at 8188,eu denied nausea so informed to the MD and 50 ml of orange juice provided, POC continues.

## 2024-04-24 LAB — MAGNESIUM: Magnesium: 2.1 mg/dL (ref 1.7–2.4)

## 2024-04-24 LAB — GLUCOSE, CAPILLARY
Glucose-Capillary: 62 mg/dL — ABNORMAL LOW (ref 70–99)
Glucose-Capillary: 120 mg/dL — ABNORMAL HIGH (ref 70–99)
Glucose-Capillary: 87 mg/dL (ref 70–99)
Glucose-Capillary: 96 mg/dL (ref 70–99)
Glucose-Capillary: 131 mg/dL — ABNORMAL HIGH (ref 70–99)
Glucose-Capillary: 142 mg/dL — ABNORMAL HIGH (ref 70–99)
Glucose-Capillary: 158 mg/dL — ABNORMAL HIGH (ref 70–99)

## 2024-04-24 LAB — BASIC METABOLIC PANEL WITH GFR
Anion gap: 11 (ref 5–15)
Anion gap: 8 (ref 5–15)
BUN: 70 mg/dL — ABNORMAL HIGH (ref 8–23)
BUN: 62 mg/dL — ABNORMAL HIGH (ref 8–23)
CO2: 20 mmol/L — ABNORMAL LOW (ref 22–32)
CO2: 20 mmol/L — ABNORMAL LOW (ref 22–32)
Calcium: 8.2 mg/dL — ABNORMAL LOW (ref 8.9–10.3)
Calcium: 8.5 mg/dL — ABNORMAL LOW (ref 8.9–10.3)
Chloride: 103 mmol/L (ref 98–111)
Chloride: 105 mmol/L (ref 98–111)
Creatinine, Ser: 1.53 mg/dL — ABNORMAL HIGH (ref 0.61–1.24)
Creatinine, Ser: 1.16 mg/dL (ref 0.61–1.24)
GFR, Estimated: 47 mL/min — ABNORMAL LOW (ref >60)
GFR, Estimated: >60 mL/min (ref >60)
Glucose, Bld: 88 mg/dL (ref 70–99)
Glucose, Bld: 144 mg/dL — ABNORMAL HIGH (ref 70–99)
Potassium: 2.9 mmol/L — ABNORMAL LOW (ref 3.5–5.1)
Potassium: 3.3 mmol/L — ABNORMAL LOW (ref 3.5–5.1)
Sodium: 134 mmol/L — ABNORMAL LOW (ref 135–145)
Sodium: 133 mmol/L — ABNORMAL LOW (ref 135–145)

## 2024-04-24 MED ORDER — SODIUM CHLORIDE 0.9 % IV SOLN: INTRAVENOUS | Status: DC

## 2024-04-24 MED ORDER — POTASSIUM CHLORIDE 10 MEQ/100ML IV SOLN: 10.0000 meq | INTRAVENOUS | Status: DC

## 2024-04-24 MED ORDER — POTASSIUM CHLORIDE 10 MEQ/100ML IV SOLN
10.0000 meq | Freq: Once | INTRAVENOUS | Status: AC
  Administered 2024-04-24: 10 meq via INTRAVENOUS

## 2024-04-24 MED ORDER — POTASSIUM CHLORIDE 10 MEQ/100ML IV SOLN
10.0000 meq | INTRAVENOUS | Status: DC
  Administered 2024-04-24: 10 meq via INTRAVENOUS

## 2024-04-24 MED ORDER — PANTOPRAZOLE SODIUM 40 MG IV SOLR
40.0000 mg | INTRAVENOUS | Status: DC
  Administered 2024-04-24: 40 mg via INTRAVENOUS
  Filled 2024-04-24: qty 10

## 2024-04-24 MED ORDER — POTASSIUM CHLORIDE 10 MEQ/100ML IV SOLN
10.0000 meq | INTRAVENOUS | Status: AC
  Administered 2024-04-24 (×8): 10 meq via INTRAVENOUS
  Filled 2024-04-24 (×10): qty 100

## 2024-04-24 MED ORDER — BANATROL TF EN LIQD
60.0000 mL | Freq: Two times a day (BID) | ENTERAL | Status: DC
  Administered 2024-04-24 – 2024-05-11 (×33): 60 mL via ORAL
  Filled 2024-04-24 (×34): qty 60

## 2024-04-24 MED ORDER — DEXTROSE 50 % IV SOLN
12.5000 g | INTRAVENOUS | Status: AC
  Administered 2024-04-24: 12.5 g via INTRAVENOUS

## 2024-04-24 MED ORDER — POTASSIUM CHLORIDE CRYS ER 20 MEQ PO TBCR
40.0000 meq | EXTENDED_RELEASE_TABLET | Freq: Once | ORAL | Status: AC
  Administered 2024-04-24: 40 meq via ORAL
  Filled 2024-04-24: qty 2

## 2024-04-24 MED ORDER — DEXTROSE 50 % IV SOLN
INTRAVENOUS | Status: AC
  Filled 2024-04-24: qty 50

## 2024-04-24 MED ORDER — BOOST PLUS PO LIQD
237.0000 mL | Freq: Three times a day (TID) | ORAL | Status: DC
  Administered 2024-04-24 – 2024-04-25 (×2): 237 mL via ORAL
  Filled 2024-04-24 (×7): qty 237

## 2024-04-24 NOTE — Progress Notes (Signed)
 PROGRESS NOTE    Chase Combs  FMW:[id number] DOB: 05-Feb-1946 DOA: 04/21/2024 PCP: Wonda Worth SQUIBB, PA  Subjective:  No acute events overnight. Seen and examined at bedside. Reports feeling slightly better. Having frequent loose stools that are now starting to get more formed. Denies nausea or vomiting or abdominal pain.     Hospital Course: 78 y.o. 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 Of note, patient had recent complicated admission for septic shock, cardiogenic shock, intestinal pneumatosis/ileus/pseudoobstruction/Ogilvie syndrome. Admitted for suspected gastroenterocolitis, AKI on CKD, hypotension and acute debilitation.    Assessment and Plan:  Possible Ileus Recurrent Ogilvie Syndrome Improving slowly History of recent hospitalization (01/22/24-02/25/24) for similar GI symptoms with extensive workup with assistance from general surgery and GI services. Underwent colonic decompression (02/18/24) with improvement.  Presented with nausea, vomiting, diarrhea Remains afebrile and without leukocytosis C diff and enteric PCR panel negative CT A/P w/o contrast with noted diffuse mild dilatation of colon, distended ileal loops and stomach Hold off on antibiotics for now Advance from NPO to full liquid diet Zofran  PRN IV fluids as elsewhere Aspiration precautions May need rectal tube if diarrhea gets persistent May need repeat  colonic decompression if doesn't improve Plan to give a few days to improve with conservative care as above May need GI consult if not improving  Hypokalemia Recurrent in nature Likely in the setting of frequent diarrhea Monitor and replete electrolytes as needed  AKI on CKD Cr 1.53 < 2.1, baseline 0.9-1.5 Likely in the setting of dehydration from diarrhea CT A/P with no concern for obstructive disease IV fluids as elsewhere Hold home entresto , spironolactone , lasix  Cont external foley for strict I/Os Avoid nephrotoxic agents Renal dose medications Monitor BMP  Hyponatremia Na 134 < 132 Likely hypovolemic in nature given ongoing diarrhea Cont NS as elsewhere Monitor BMP  Presyncope Hypotension Likely in the setting of dehydration/orthostatic hypotension from possible ileus/pseudoobstruction/gastroenterocolitis CT head unremarkable NPO for now  Hold BP meds as elsewhere Given LR in ED and 62ml/hr LR on admission Status post 2 additional NS 500ml boluses Cont NS 126ml/hr Monitor on tele   Non-ischemic myocardial injury CAD Recent balloon angioplasty of LAD stent thrombosis (02/01/2024) due to inability to tolerate oral DAPT because of intractable nausea and vomiting History of remote PCI to RI and RCA EKG with known RBBB, LAFB, no significant changes from prior Troponin 67 < 71 Cont home plavix , eliquis  Will need to touch base with cardiology and switch to IV alternatives if unable to take plavix  and eliquis  orally Monitor on tele Monitor clinically   Acute hypoxia Noted to have cough, shortness of breath, R > Lung crackles on ED presentation. Concern for orthopnea also present Likely in the setting of abdominal distension from underlying ileus/pseudoobstruction/gastroenterocolitis Currently on 2L oxygen, not on oxygen at home See management of ileus as elsewhere Wean oxygen as tolerated    CHFrEF Recent prolonged hospitalization complicated by cardiogenic  shock with acute CHF exacerbation in the setting of LAD stent stenosis  Last echo 01/2024  with LVEF 20-25% Not clinically fluid overloaded. In fact, appears hypovolemic on exam BNP better than prior labs Home home lasix , spironolactone , entresto  IV fluids as elsewhere Monitor I/Os, daily weights, renal function, electrolytes   Paroxysmal Afib S/p TEE CV on 02/21/2024 Dig level 0.7 Cont home amiodarone  Cont home digoxin  as AKI improving Cont home eliquis  Recheck digoxin  level on 12/5, ordered Monitor on tele   Peptic ulcer disease Recent hospitalization with EGD (02/02/24) showing esophageal ulcer without bleeding, grade C reflux esophagitis without bleeding, duodenal erosions At home on pantoprazole  40mg  daily Switch IV to PO pantoprazole  Monitor for signs/symptoms of acute bleed   Hypomagnesemia Monitor and replete electrolytes as needed   T2DM Hold home empagliflozin  Cont SSI with FS q6h while NPO   Incidental R thyroid  nodule Noted on CT C spine TSH normal Will need outpatient thyroid  US    Chronic anemia Hgb 11.2, baseline 10-12 Cont anticoagulation as elsewhere Monitor clinically   HLD Cont home statin   Acute debilitation Mechanical fall Monitor on fall precautions Will get PT/OT eval  DVT prophylaxis: SCDs Start: 04/22/24 0137 apixaban  (ELIQUIS ) tablet 5 mg  Eliquis    Code Status: Limited: Do not attempt resuscitation (DNR) -DNR-LIMITED -Do Not Intubate/DNI  Disposition Plan: TBD, PT eval ordered and pending Reason for continuing need for hospitalization: severity of illness, PT eval  Objective: Vitals:   04/23/24 1611 04/23/24 2022 04/24/24 0500 04/24/24 0804  BP: 95/61 108/60  (!) 105/49  Pulse: (!) 54 (!) 20  (!) 30  Resp: 20 18  16   Temp: 97.6 F (36.4 C) 97.8 F (36.6 C)    TempSrc: Oral Oral    SpO2: 98% 94%  90%  Weight:   83.5 kg   Height:        Intake/Output Summary (Last 24 hours) at 04/24/2024 1116 Last data filed at 04/24/2024  0804 Gross per 24 hour  Intake 50 ml  Output 1050 ml  Net -1000 ml   Filed Weights   04/22/24 0207 04/23/24 0500 04/24/24 0500  Weight: 83.5 kg 83.5 kg 83.5 kg    Examination:  Physical Exam Vitals and nursing note reviewed.  Constitutional:      General: He is not in acute distress.    Appearance: He is ill-appearing.     Comments: Weak, frail  HENT:     Head: Normocephalic and atraumatic.  Cardiovascular:     Rate and Rhythm: Normal rate and regular rhythm.     Pulses: Normal pulses.     Heart sounds: Normal heart sounds.  Pulmonary:     Effort: Pulmonary effort is normal.     Breath sounds: Normal breath sounds.  Abdominal:     General: There is distension.     Palpations: Abdomen is soft.     Tenderness: There is no abdominal tenderness. There is no guarding or rebound.     Hernia: No hernia is present.  Musculoskeletal:     Right lower leg: No edema.     Left lower leg: No edema.  Neurological:     Mental Status: He is alert. Mental status is at baseline.     Data Reviewed: I have personally reviewed following labs and imaging studies  CBC: Recent Labs  Lab 04/21/24 1951 04/22/24 0331 04/23/24 0223  WBC 3.3* 4.7 5.0  NEUTROABS  --  2.9 2.9  HGB 12.7* 11.1* 12.4*  HCT 38.4* 33.3* 38.2*  MCV 91.6 90.2 91.6  PLT 364 309 307   Basic Metabolic Panel: Recent Labs  Lab 04/21/24 1951 04/22/24 0331 04/22/24 1351 04/23/24 0223 04/23/24 1335 04/24/24 0653  NA 134* 129* 135 132* 132* 134*  K 3.0* 3.2* 3.4* 3.0* 3.2* 2.9*  CL 93* 96* 101 100 101 103  CO2 22 23 19* 18* 18* 20*  GLUCOSE 132* 127* 104* 109* 90 88  BUN 38* 46* 59* 68* 79* 70*  CREATININE 1.65* 2.10* 2.05* 1.85* 2.17* 1.53*  CALCIUM  9.9 8.4* 8.5* 8.5* 8.0* 8.2*  MG 1.8 1.6* 2.0 2.0  --  2.1   GFR: Estimated Creatinine Clearance: 45.1 mL/min (A) (by C-G formula based on SCr of 1.53 mg/dL (H)). Liver Function Tests: Recent Labs  Lab 04/21/24 1951 04/22/24 0331 04/23/24 0223  AST 22  27 52*  ALT 20 25 66*  ALKPHOS 86 54 60  BILITOT 0.8 0.7 0.5  PROT 7.3 5.6* 5.7*  ALBUMIN 4.1 2.7* 2.6*   No results for input(s): LIPASE, AMYLASE in the last 168 hours. No results for input(s): AMMONIA in the last 168 hours. Coagulation Profile: Recent Labs  Lab 04/21/24 1951  INR 1.7*   Cardiac Enzymes: No results for input(s): CKTOTAL, CKMB, CKMBINDEX, TROPONINI in the last 168 hours. ProBNP, BNP (last 5 results) Recent Labs    01/07/24 2301 01/11/24 0539 01/22/24 1243 03/09/24 1444 03/23/24 1456 04/07/24 1235 04/22/24 0607  PROBNP 15,245.0*  --   --   --   --   --   --   BNP  --    < > 220.4* 618.6* 1,985.4* 940.4* 844.7*   < > = values in this interval not displayed.   HbA1C: Recent Labs    04/22/24 0607  HGBA1C 5.5   CBG: Recent Labs  Lab 04/23/24 1305 04/23/24 1811 04/24/24 0122 04/24/24 0207 04/24/24 0649  GLUCAP 87 71 62* 120* 87   Lipid Profile: No results for input(s): CHOL, HDL, LDLCALC, TRIG, CHOLHDL, LDLDIRECT in the last 72 hours. Thyroid  Function Tests: Recent Labs    04/22/24 0609  TSH 1.029   Anemia Panel: No results for input(s): VITAMINB12, FOLATE, FERRITIN, TIBC, IRON, RETICCTPCT in the last 72 hours. Sepsis Labs: Recent Labs  Lab 04/21/24 2240 04/22/24 0607 04/22/24 0843  PROCALCITON  --  8.67  --   LATICACIDVEN 1.2  --  1.6    Recent Results (from the past 240 hours)  Resp panel by RT-PCR (RSV, Flu A&B, Covid) Anterior Nasal Swab     Status: None   Collection Time: 04/21/24  7:51 PM   Specimen: Anterior Nasal Swab  Result Value Ref Range Status   SARS Coronavirus 2 by RT PCR NEGATIVE NEGATIVE Final    Comment: (NOTE) SARS-CoV-2 target nucleic acids are NOT DETECTED.  The SARS-CoV-2 RNA is generally detectable in upper respiratory specimens during the acute phase of infection. The lowest concentration of SARS-CoV-2 viral copies this assay can detect is 138 copies/mL. A negative  result does not preclude SARS-Cov-2 infection and should not be used as the sole basis for treatment or other patient management decisions. A negative result may occur with  improper specimen collection/handling, submission of specimen other than nasopharyngeal swab, presence of viral mutation(s) within the areas targeted by this assay, and inadequate number of viral copies(<138 copies/mL). A negative result must be combined with clinical observations, patient history, and epidemiological information. The expected result is Negative.  Fact Sheet for Patients:  bloggercourse.com  Fact Sheet for Healthcare Providers:  seriousbroker.it  This test is no t yet approved or cleared by the [place]  FDA and  has been authorized for detection and/or diagnosis of SARS-CoV-2 by FDA under an Emergency Use Authorization (EUA). This EUA will remain  in effect (meaning this test can be used) for the duration of the COVID-19 declaration under Section 564(b)(1) of the Act, 21 U.S.C.section 360bbb-3(b)(1), unless the authorization is terminated  or revoked sooner.       Influenza A by PCR NEGATIVE NEGATIVE Final   Influenza B by PCR NEGATIVE NEGATIVE Final    Comment: (NOTE) The Xpert Xpress SARS-CoV-2/FLU/RSV plus assay is intended as an aid in the diagnosis of influenza from Nasopharyngeal swab specimens and should not be used as a sole basis for treatment. Nasal washings and aspirates are unacceptable for Xpert Xpress SARS-CoV-2/FLU/RSV testing.  Fact Sheet for Patients: bloggercourse.com  Fact Sheet for Healthcare Providers: seriousbroker.it  This test is not yet approved or cleared by the [place]  FDA and has been authorized for detection and/or diagnosis of SARS-CoV-2 by FDA under an Emergency Use Authorization (EUA). This EUA will remain in effect (meaning this test can be used)  for the duration of the COVID-19 declaration under Section 564(b)(1) of the Act, 21 U.S.C. section 360bbb-3(b)(1), unless the authorization is terminated or revoked.     Resp Syncytial Virus by PCR NEGATIVE NEGATIVE Final    Comment: (NOTE) Fact Sheet for Patients: bloggercourse.com  Fact Sheet for Healthcare Providers: seriousbroker.it  This test is not yet approved or cleared by the [place]  FDA and has been authorized for detection and/or diagnosis of SARS-CoV-2 by FDA under an Emergency Use Authorization (EUA). This EUA will remain in effect (meaning this test can be used) for the duration of the COVID-19 declaration under Section 564(b)(1) of the Act, 21 U.S.C. section 360bbb-3(b)(1), unless the authorization is terminated or revoked.  Performed at [facility], [address] [place], [place] [address]   C Difficile Quick Screen w PCR reflex     Status: None   Collection Time: 04/22/24  6:33 PM   Specimen: STOOL  Result Value Ref Range Status   C Diff antigen NEGATIVE NEGATIVE Final   C Diff toxin NEGATIVE NEGATIVE Final   C Diff interpretation No C. difficile detected.  Final    Comment: Performed at [facility], [address], [place], [place] [address]  Gastrointestinal Panel by PCR , Stool     Status: None   Collection Time: 04/22/24  6:33 PM   Specimen: Stool  Result Value Ref Range Status   Campylobacter species NOT DETECTED NOT DETECTED Final   Plesimonas shigelloides NOT DETECTED NOT DETECTED Final   Salmonella species NOT DETECTED NOT DETECTED Final   Yersinia enterocolitica NOT DETECTED NOT DETECTED Final   Vibrio species NOT DETECTED NOT DETECTED Final   Vibrio cholerae NOT DETECTED NOT DETECTED Final   Enteroaggregative E coli (EAEC) NOT DETECTED NOT DETECTED Final   Enteropathogenic E coli (EPEC) NOT DETECTED NOT DETECTED Final   Enterotoxigenic E coli (ETEC) NOT DETECTED  NOT DETECTED Final   Shiga like toxin producing E coli (STEC) NOT DETECTED NOT DETECTED Final   Shigella/Enteroinvasive E coli (EIEC) NOT DETECTED NOT DETECTED Final   Cryptosporidium NOT DETECTED NOT DETECTED Final   Cyclospora cayetanensis NOT DETECTED NOT DETECTED Final   Entamoeba histolytica NOT DETECTED NOT DETECTED Final   Giardia lamblia NOT DETECTED NOT DETECTED Final   Adenovirus F40/41 NOT DETECTED NOT DETECTED Final   Astrovirus NOT DETECTED NOT DETECTED Final   Norovirus GI/GII NOT DETECTED NOT DETECTED Final  Rotavirus A NOT DETECTED NOT DETECTED Final   Sapovirus (I, II, IV, and V) NOT DETECTED NOT DETECTED Final    Comment: Performed at [facility], [address], [place], [place] [address]     Radiology Studies: DG Abd 1 View Result Date: 04/23/2024 CLINICAL DATA:  Abdominal distension/ileus. EXAM: ABDOMEN - 1 VIEW COMPARISON:  KUB 02/20/2024, CT 04/21/2024 FINDINGS: Exam demonstrates continued evidence of multiple air-filled prominent colonic loops measuring up to 9.7 cm in diameter over the ascending colon as these findings are not significantly changed. Several air-filled mildly dilated distal small bowel loops measuring up to 4.7 cm slightly more prominent than the recent CT. No free peritoneal air. Remainder of the exam is unchanged. IMPRESSION: Findings suggesting persistent colonic ileus. Slight increase in distal small bowel dilatation which may be due to ileus versus early obstruction. Electronically Signed   By: Toribio Agreste M.D.   On: 04/23/2024 11:53    Scheduled Meds:  amiodarone   200 mg Oral Daily   apixaban   5 mg Oral BID   clopidogrel   75 mg Oral Daily   digoxin   0.0625 mg Oral Daily   insulin  aspart  0-6 Units Subcutaneous Q6H   pantoprazole  (PROTONIX ) IV  40 mg Intravenous Q24H   rosuvastatin   20 mg Oral Daily   Continuous Infusions:  sodium chloride      potassium chloride  10 mEq (04/24/24 1043)     LOS: 2 days   Norval Bar,  MD  [facility]  04/24/2024, 11:16 AM

## 2024-04-24 NOTE — Progress Notes (Signed)
 Heart Failure Navigator Progress Note  Assessed for Heart & Vascular TOC clinic readiness.  Patient does not meet criteria due to he is an Advanced Heart Failure Team patient of Dr. Zenaida. .   Navigator will sign off at this time.   Stephane Haddock, BSN, Scientist, clinical (histocompatibility and immunogenetics) Only

## 2024-04-24 NOTE — Progress Notes (Signed)
 Initial Nutrition Assessment  DOCUMENTATION CODES:   Non-severe (moderate) malnutrition in context of chronic illness Pt currently only meets moderate malnutrition diagnosis criteria based on physical findings but given severity of muscle depletion, pt at risk for worsening malnutrition if PO intake remains poor  INTERVENTION:   Boost Plus po TID, each supplement provides 360 kcal and 14 grams of protein  Banatrol BID orally to help with loose stools  Encouraged adequate PO intake of meals and supplements to meet nutrition needs  NUTRITION DIAGNOSIS:   Moderate Malnutrition related to chronic illness as evidenced by moderate fat depletion, severe muscle depletion, moderate muscle depletion.  GOAL:   Patient will meet greater than or equal to 90% of their needs  MONITOR:   Diet advancement, Labs  REASON FOR ASSESSMENT:   Consult Assessment of nutrition requirement/status  ASSESSMENT:   Pt with hx of heart failure, atrial fibrillation, type 2 diabetes, and OSA. Recent admission 8/30-10/2 for colonic pseudoobstruction and underwent EGD, decompression colonoscopy, and required TPN. Admitted with suspected gastroenterocolitis after 1-2 days of n/v, and diarrhea, diagnosed AKI on CKD, and debilitation.  11/28 admitted, NPO 12/1 diet advanced to full liquids  Pt appears with recurrent Ogilvie Syndrome and possible ileus. MD recommends conservative care and advanced to full liquids today. C Diff negative.   Spoke with pt who reports no issues with n/v or diarrhea this morning, but has had 4 type 7 bowel movements in last 24 hours. Discussed adding banatrol to help thicken stool which can be taken orally. Stomach still distended and may be holding fluid. Pt now advanced to full liquids, pt discloses he will agree to ONS like Ensure or Boost Plus. Diet had just been upgraded and pt had not received tray yet.  PTA, pt reports he was having diarrhea for 2 weeks leading up to admission  and n/v for 1 day. Pt reports his appetite was fair and he was still eating 3x per day. 24 hr recall as follows: Breakfast: eggs, toast, cereal Lunch: sub or sandwich  Dinner: meat, vegetable, side  Pt reports wife cooks all meals and he does not avoid any foods. Reports drinking Boost Plus at baseline and water .   Pt reports no recent weight fluctuations and has actually gained weight since last admission. Nutrition focused physical exam shows mild to moderate fat depletion and moderate to severe muscle depletion, indicative of malnutrition. Malnutrition likely in the setting of chronic Ogilvie Syndrome which has been ongoing for about 6 months with multiple hospitalizations. Pt currently only meets moderate malnutrition diagnosis criteria based on physical findings but given severity of muscle depletion, pt at risk for worsening malnutrition if PO intake remains poor.  Pt expressed eagerness to be discharged.    Medications: Dextrose  50% SSI 0-6 units q6h Protonix  Potassium chloride  10 mEq every hr   Labs: CBG x 24 h: 62-120 mg/dL Sodium 865 Potassium 2.9 BUN 70/Cr 1.53  NUTRITION - FOCUSED PHYSICAL EXAM: Stomach distended and likely holding fluid  Flowsheet Row Most Recent Value  Orbital Region Mild depletion  Upper Arm Region No depletion  Thoracic and Lumbar Region Moderate depletion  Buccal Region Mild depletion  Temple Region Moderate depletion  Clavicle Bone Region Severe depletion  Clavicle and Acromion Bone Region Severe depletion  Scapular Bone Region Severe depletion  Dorsal Hand Moderate depletion  Patellar Region Moderate depletion  Anterior Thigh Region Moderate depletion  Posterior Calf Region Severe depletion  Edema (RD Assessment) None  Hair Reviewed  Eyes Reviewed  Mouth  Reviewed  Skin Reviewed  Nails Reviewed    Diet Order:   Diet Order             Diet NPO time specified Except for: Sips with Meds  Diet effective now                    EDUCATION NEEDS:   Education needs have been addressed  Skin:  Skin Assessment: Reviewed RN Assessment  Last BM:  4 type 7s in last 24 hr  Height:   Ht Readings from Last 1 Encounters:  04/21/24 6' (1.829 m)    Weight:   Wt Readings from Last 1 Encounters:  04/24/24 83.5 kg    Ideal Body Weight:  80.9 kg  BMI:  Body mass index is 24.97 kg/m.  Estimated Nutritional Needs:   Kcal:  1900-2100  Protein:  90-110 g  Fluid:  2L    Josette Glance, MS, RDN, LDN Clinical Dietitian I Please reach out via secure chat

## 2024-04-25 ENCOUNTER — Inpatient Hospital Stay (HOSPITAL_COMMUNITY)

## 2024-04-25 LAB — BASIC METABOLIC PANEL WITH GFR
Anion gap: 8 (ref 5–15)
Anion gap: 8 (ref 5–15)
BUN: 49 mg/dL — ABNORMAL HIGH (ref 8–23)
BUN: 43 mg/dL — ABNORMAL HIGH (ref 8–23)
CO2: 16 mmol/L — ABNORMAL LOW (ref 22–32)
CO2: 21 mmol/L — ABNORMAL LOW (ref 22–32)
Calcium: 8.7 mg/dL — ABNORMAL LOW (ref 8.9–10.3)
Calcium: 9 mg/dL (ref 8.9–10.3)
Chloride: 112 mmol/L — ABNORMAL HIGH (ref 98–111)
Chloride: 108 mmol/L (ref 98–111)
Creatinine, Ser: 1.11 mg/dL (ref 0.61–1.24)
Creatinine, Ser: 1.04 mg/dL (ref 0.61–1.24)
GFR, Estimated: >60 mL/min (ref >60)
GFR, Estimated: >60 mL/min (ref >60)
Glucose, Bld: 159 mg/dL — ABNORMAL HIGH (ref 70–99)
Glucose, Bld: 135 mg/dL — ABNORMAL HIGH (ref 70–99)
Potassium: 3.8 mmol/L (ref 3.5–5.1)
Potassium: 3.8 mmol/L (ref 3.5–5.1)
Sodium: 136 mmol/L (ref 135–145)
Sodium: 137 mmol/L (ref 135–145)

## 2024-04-25 LAB — MAGNESIUM: Magnesium: 2.2 mg/dL (ref 1.7–2.4)

## 2024-04-25 LAB — GLUCOSE, CAPILLARY
Glucose-Capillary: 170 mg/dL — ABNORMAL HIGH (ref 70–99)
Glucose-Capillary: 136 mg/dL — ABNORMAL HIGH (ref 70–99)
Glucose-Capillary: 155 mg/dL — ABNORMAL HIGH (ref 70–99)
Glucose-Capillary: 81 mg/dL (ref 70–99)
Glucose-Capillary: 133 mg/dL — ABNORMAL HIGH (ref 70–99)

## 2024-04-25 MED ORDER — INSULIN ASPART 100 UNIT/ML IJ SOLN: 0.0000 [IU] | Freq: Every day | INTRAMUSCULAR | Status: DC

## 2024-04-25 MED ORDER — PANTOPRAZOLE SODIUM 40 MG PO TBEC
40.0000 mg | DELAYED_RELEASE_TABLET | Freq: Every day | ORAL | Status: DC
  Administered 2024-04-25: 40 mg via ORAL
  Filled 2024-04-25: qty 1

## 2024-04-25 MED ORDER — INSULIN ASPART 100 UNIT/ML IJ SOLN
0.0000 [IU] | Freq: Three times a day (TID) | INTRAMUSCULAR | Status: DC
  Administered 2024-04-25: 3 [IU] via SUBCUTANEOUS
  Filled 2024-04-25: qty 3

## 2024-04-25 MED ORDER — INSULIN ASPART 100 UNIT/ML IJ SOLN: 0.0000 [IU] | Freq: Three times a day (TID) | INTRAMUSCULAR | Status: DC

## 2024-04-25 NOTE — Progress Notes (Signed)
 PHARMACIST - PHYSICIAN COMMUNICATION  DR:   Cosette  CONCERNING: IV to Oral Route Change Policy  RECOMMENDATION: This patient is receiving protonix  by the intravenous route.  Based on criteria approved by the Pharmacy and Therapeutics Committee, the intravenous medication(s) is/are being converted to the equivalent oral dose form(s).   DESCRIPTION: These criteria include: The patient is eating (either orally or via tube) and/or has been taking other orally administered medications for a least 24 hours The patient has no evidence of active gastrointestinal bleeding or impaired GI absorption (gastrectomy, short bowel, patient on TNA or NPO).  If you have questions about this conversion, please contact the Pharmacy Department  []   [phone] )  [facility] []   [phone] )  [facility] [x]   [phone] )  [facility] []   [phone] )  [facility] []   [phone] )  [facility]

## 2024-04-25 NOTE — Consult Note (Signed)
 Cardiology Consultation   Patient ID: Chase Combs MRN: [id number]; DOB: January 06, 1946  Admit date: 04/21/2024 Date of Consult: 04/25/2024  PCP:  Wonda Worth SQUIBB, PA   [facility] Providers Cardiologist:  Alm Clay, MD   { Click here to update MD or APP on Care Team, Refresh:1}     Patient Profile: Chase Combs is a 78 y.o. male with a hx of paroxysmal atrial fibrillation on Eliquis ,  recent NSTEMI due to LAD stent thrombosis status post balloon angioplasty (02/01/24), CAD s/p PCI to LAD (01/17/2024) and remote PCI to RI and RCA, ischemic cardiomyopathy, HTN, HLD, type 2 diabetes mellitus, CKD, OSA with CPAP intolerance, ogilvie syndrome and prostate cancer  who is being seen 04/25/2024 for the evaluation of atrial fibrillation at the request of the Hospitalist Service.  History of Present Illness: Chase Combs ***   Past Medical History:  Diagnosis Date   Abdominal hernia    per pt   Arthritis    hands   Benign localized prostatic hyperplasia with lower urinary tract symptoms (LUTS)    CAD (coronary artery disease) 05/2004   cardiologist--- dr clay;  positive stress test , had outpt cardiac cath 05-27-2004 stenosis RI;  06-18-2004 cath w/ PCI and BMS x1 to pRI;   STEMI 02-16-2009  s/p cath w/ PCI thrombectomy for total occluded RCA , BMS x1 to pRCA;  nuclear stress test 03-15-2012 low risk no ischemia but sig inferior-inferolateral infarct , ef 51%   Dyslipidemia    Essential hypertension    Heart murmur    History of COVID-19 05/2020   per pt mild symptoms that resolved   History of ST elevation myocardial infarction (STEMI) 02/16/2009   inferior STEMI   cath w/ pci w/ BMS to pRCA   History of urinary retention    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[facility]) 04/2021   urologist--- dr borden/ radiation oncology-- dr patrcia;  dx 12/ 2022,  Gleason 4+5, PSA 24.6, volume 94.6cc;  plan to start IMRT   OSA (obstructive sleep apnea)    per pt dx approx 2008, no cpap  intolerant   PONV (postoperative nausea and vomiting)    Pre-diabetes    S/p bare metal coronary artery stent    01/ 2006  x1 BMS to pRI (CoStar study stent);  and 09/ 2010  x1 BMS to pRCA   Status post total knee replacement, 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    Past Surgical History:  Procedure Laterality Date   BOWEL DECOMPRESSION N/A 02/18/2024   Procedure: DECOMPRESSION, INTESTINE;  Surgeon: Albertus Gordy HERO, MD;  Location: [facility] ENDOSCOPY;  Service: Gastroenterology;  Laterality: N/A;   CARDIAC CATHETERIZATION  05/27/2004   outpatient by dr burnard [facility] cardiology) for positive stress test;  80% stensis pRI   CARDIOVERSION N/A 02/21/2024   Procedure: CARDIOVERSION;  Surgeon: Cherrie Toribio SAUNDERS, MD;  Location: [facility] INVASIVE CV LAB;  Service: Cardiovascular;  Laterality: N/A;   COLONOSCOPY N/A 02/18/2024   Procedure: COLONOSCOPY;  Surgeon: Albertus Gordy HERO, MD;  Location: [facility] ENDOSCOPY;  Service: Gastroenterology;  Laterality: N/A;   CORONARY ANGIOPLASTY WITH STENT PLACEMENT  06/18/2004   @[facility]  by Dr Lavon;  PCI w/ BMS to pRI  (CoStar study stent - 2.5 x 16)   CORONARY ANGIOPLASTY WITH STENT PLACEMENT  02/16/2009   @[facility]  by dr verlin;   Inferior STEMI:  PCI w/ thrombectomy total occluded RCA, BMS x1 to pRCA (Vision BMS 3.5 x 23 -> 4.0 mm), residual 40-50% pLAD, ef 45-50%   CORONARY PRESSURE/FFR  STUDY N/A 01/17/2024   Procedure: CORONARY PRESSURE/FFR STUDY;  Surgeon: Mady Bruckner, MD;  Location: [facility] INVASIVE CV LAB;  Service: Cardiovascular;  Laterality: N/A;   CORONARY STENT INTERVENTION N/A 01/17/2024   Procedure: CORONARY STENT INTERVENTION;  Surgeon: Mady Bruckner, MD;  Location: [facility] INVASIVE CV LAB;  Service: Cardiovascular;  Laterality: N/A;   CORONARY ULTRASOUND/IVUS N/A 01/17/2024   Procedure: Coronary Ultrasound/IVUS;  Surgeon: Mady Bruckner, MD;  Location: [facility] INVASIVE CV LAB;  Service: Cardiovascular;  Laterality: N/A;   CORONARY/GRAFT ACUTE MI REVASCULARIZATION N/A 02/01/2024   Procedure:  Coronary/Graft Acute MI Revascularization;  Surgeon: Elmira Newman PARAS, MD;  Location: [facility] INVASIVE CV LAB;  Service: Cardiovascular;  Laterality: N/A;   ESOPHAGOGASTRODUODENOSCOPY N/A 02/02/2024   Procedure: EGD (ESOPHAGOGASTRODUODENOSCOPY);  Surgeon: Nandigam, Kavitha V, MD;  Location: [facility] ENDOSCOPY;  Service: Gastroenterology;  Laterality: N/A;   FLEXIBLE SIGMOIDOSCOPY N/A 10/20/2023   Procedure: KINGSTON SIDE;  Surgeon: Legrand Victory LITTIE DOUGLAS, MD;  Location: [facility] ENDOSCOPY;  Service: Gastroenterology;  Laterality: N/A;   FLEXIBLE SIGMOIDOSCOPY N/A 02/02/2024   Procedure: SIGMOIDOSCOPY, FLEXIBLE;  Surgeon: Shila Gustav GAILS, MD;  Location: [facility] ENDOSCOPY;  Service: Gastroenterology;  Laterality: N/A;   GOLD SEED IMPLANT N/A 09/19/2021   Procedure: GOLD SEED IMPLANT;  Surgeon: Cam Morene ORN, MD;  Location: [facility];  Service: Urology;  Laterality: N/A;  ONLY NEEDS 30 MIN FOR ALL   INGUINAL HERNIA REPAIR Right 09/02/1999   @[facility] ;   recurrent repair right inguinal hernia;   previous repair approx 1990s   INGUINAL HERNIA REPAIR Left    1990s   KNEE ARTHROSCOPY W/ MENISCAL REPAIR Right 12/18/2005   @[facility]    LUMBAR DISC SURGERY  1982   L5--S1   RIGHT/LEFT HEART CATH AND CORONARY ANGIOGRAPHY N/A 01/17/2024   Procedure: RIGHT/LEFT HEART CATH AND CORONARY ANGIOGRAPHY;  Surgeon: Mady Bruckner, MD;  Location: [facility] INVASIVE CV LAB;  Service: Cardiovascular;  Laterality: N/A;   SPACE OAR INSTILLATION N/A 09/19/2021   Procedure: SPACE OAR INSTILLATION;  Surgeon: Cam Morene ORN, MD;  Location: [facility];  Service: Urology;  Laterality: N/A;   TOTAL KNEE ARTHROPLASTY Left 10/01/2023   Procedure: ARTHROPLASTY, KNEE, TOTAL;  Surgeon: Kay Kemps, MD;  Location: [facility] ORS;  Service: Orthopedics;  Laterality: Left;   TRANSESOPHAGEAL ECHOCARDIOGRAM (CATH LAB) N/A 02/21/2024   Procedure: TRANSESOPHAGEAL ECHOCARDIOGRAM;  Surgeon: Cherrie Toribio SAUNDERS, MD;  Location: [facility]  INVASIVE CV LAB;  Service: Cardiovascular;  Laterality: N/A;     Home Medications:  Prior to Admission medications   Medication Sig Start Date End Date Taking? Authorizing Provider  amiodarone  (PACERONE ) 200 MG tablet Take 1 tablet (200 mg total) by mouth daily. 04/07/24   Milford, Harlene HERO, FNP  apixaban  (ELIQUIS ) 5 MG TABS tablet Take 1 tablet (5 mg total) by mouth 2 (two) times daily. 03/23/24   Colletta Manuelita Garre, PA-C  CALCIUM  PO Take 500 mg by mouth daily.    [provider]  clopidogrel  (PLAVIX ) 75 MG tablet Take 1 tablet (75 mg total) by mouth daily. 03/23/24   Colletta Manuelita Garre, PA-C  cyanocobalamin  (VITAMIN B12) 1000 MCG tablet Take 1,000 mcg by mouth daily.    [provider]  digoxin  (LANOXIN ) 0.125 MG tablet Take 1/2 tablet (0.0625 mg total) by mouth daily. 03/23/24   Colletta Manuelita Garre, PA-C  empagliflozin  (JARDIANCE ) 10 MG TABS tablet Take 1 tablet (10 mg total) by mouth daily before breakfast. 03/10/24   Lee, Jordan, NP  furosemide  (LASIX ) 40 MG tablet Take 1 tablet (  40 mg total) by mouth 2 (two) times daily. 04/07/24 07/06/24  Glena Harlene HERO, FNP  Multiple Vitamins-Minerals (MULTIVITAMIN MEN 50+) TABS Take 1 tablet by mouth daily.    [provider]  nitroGLYCERIN  (NITROSTAT ) 0.4 MG SL tablet Place 0.4 mg under the tongue every 5 (five) minutes as needed for chest pain. 01/19/24   [provider]  ondansetron  (ZOFRAN ) 8 MG tablet Take 8 mg by mouth every 8 (eight) hours as needed for nausea or vomiting. 02/29/24   [provider]  pantoprazole  (PROTONIX ) 40 MG tablet Take 1 tablet (40 mg total) by mouth daily. 02/24/24   Arrien, Mauricio Daniel, MD  potassium chloride  SA (KLOR-CON  M) 20 MEQ tablet Take 1 tablet (20 mEq total) by mouth 2 (two) times daily. 04/07/24   Glena Harlene HERO, FNP  Probiotic Product (PROBIOTIC PO) Take 1,000 mg by mouth daily. Chew    [provider]  rosuvastatin  (CRESTOR ) 20 MG tablet  TAKE 1 TABLET(20 MG) BY MOUTH DAILY 11/29/23   Anner Alm ORN, MD  sacubitril -valsartan  (ENTRESTO ) 24-26 MG Take 1 tablet by mouth 2 (two) times daily. 04/07/24   Glena Harlene HERO, FNP  sertraline (ZOLOFT) 50 MG tablet Take 25-50 mg by mouth See admin instructions. Taking 25 mg for 7 day and 50 mg daily after    [provider]  spironolactone  (ALDACTONE ) 25 MG tablet Take 0.5 tablets (12.5 mg total) by mouth daily. 03/14/24 06/12/24  Glena Harlene HERO, FNP  tamsulosin  (FLOMAX ) 0.4 MG CAPS capsule Take 1 capsule (0.4 mg total) by mouth daily after supper. 11/17/21   Patrcia Cough, MD    Scheduled Meds:  amiodarone   200 mg Oral Daily   clopidogrel   75 mg Oral Daily   digoxin   0.0625 mg Oral Daily   fiber supplement (BANATROL TF)  60 mL Oral BID   pantoprazole   40 mg Oral Daily   rosuvastatin   20 mg Oral Daily   Continuous Infusions:  sodium chloride  Stopped (04/25/24 0500)   PRN Meds: acetaminophen  **OR** acetaminophen , melatonin, ondansetron  (ZOFRAN ) IV  Allergies:    Allergies  Allergen Reactions   Codeine Nausea And Vomiting   Latex Rash    Oral blisters when used at dentist    Social History:   Social History   Socioeconomic History   Marital status: Married    Spouse name: [name]   Number of children: 3   Years of education: Not on file   Highest education level: Not on file  Occupational History   Not on file  Tobacco Use   Smoking status: Never   Smokeless tobacco: Never  Vaping Use   Vaping status: Never Used  Substance and Sexual Activity   Alcohol use: No    Alcohol/week: 0.0 standard drinks of alcohol   Drug use: Never   Sexual activity: Not on file  Other Topics Concern   Not on file  Social History Narrative   He is married, father of 3, grandfather of 5. Never smoked. Does not drink.    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    He does lots of  lifting, pulling with ropes, using upper extremities.       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   Social Drivers of  Health   Financial Resource Strain: Not on file  Food Insecurity: No Food Insecurity (04/22/2024)   Hunger Vital Sign    Worried About Running Out of Food in the Last Year: Never true    Ran Out of Food in the Last Year: Never true  Transportation Needs: No Transportation Needs (04/22/2024)   PRAPARE - Administrator, Civil Service (Medical): No    Lack of Transportation (Non-Medical): No  Physical Activity: Not on file  Stress: Not on file  Social Connections: Moderately Integrated (04/24/2024)   Social Connection and Isolation Panel    Frequency of Communication with Friends and Family: Three times a week    Frequency of Social Gatherings with Friends and Family: Three times a week    Attends Religious Services: 1 to 4 times per year    Active Member of Clubs or Organizations: No    Attends Banker Meetings: Not on file    Marital Status: Married  Catering Manager Violence: Not At Risk (04/24/2024)   Humiliation, Afraid, Rape, and Kick questionnaire    Fear of Current or Ex-Partner: No    Emotionally Abused: No    Physically Abused: No    Sexually Abused: No    Family History:    Family History  Problem Relation Age of Onset   Lung cancer Mother    Colon cancer Neg Hx    Esophageal cancer Neg Hx      ROS:  Please see the history of present illness.   All other ROS reviewed and negative.     Physical Exam/Data: Vitals:   04/25/24 1323 04/25/24 1553 04/25/24 1928 04/25/24 2137  BP: 119/75 122/73 99/70 94/65   Pulse: 92 84 75 78  Resp:   17 17  Temp:  (!) 97.4 F (36.3 C) 97.6 F (36.4 C)   TempSrc:   Oral   SpO2: 95% 91% 95% 92%  Weight:      Height:        Intake/Output Summary (Last 24 hours) at  04/25/2024 2358 Last data filed at 04/25/2024 1945 Gross per 24 hour  Intake 983.71 ml  Output 1100 ml  Net -116.29 ml      04/25/2024    4:45 AM 04/24/2024    5:00 AM 04/23/2024    5:00 AM  Last 3 Weights  Weight (lbs) 193 lb 12.6 oz 184 lb 1.4 oz 184 lb 1.4 oz  Weight (kg) 87.9 kg 83.5 kg 83.5 kg     Body mass index is 26.28 kg/m.  General:  Well nourished, well developed, in no acute distress*** HEENT: normal Neck: no JVD Vascular: No carotid bruits; Distal pulses 2+ bilaterally Cardiac:  normal S1, S2; RRR; no murmur *** Lungs:  clear to auscultation bilaterally, no wheezing, rhonchi or rales  Abd: soft, nontender, no hepatomegaly  Ext: no edema Musculoskeletal:  No deformities, BUE and BLE strength normal and equal Skin: warm and dry  Neuro:  CNs 2-12 intact, no focal abnormalities noted Psych:  Normal affect   EKG:  The EKG was personally reviewed and demonstrates:  *** Telemetry:  Telemetry was personally reviewed and demonstrates:  ***  Relevant CV Studies: ***  Laboratory Data: High Sensitivity Troponin:  No results for input(s): TROPONINIHS in the last 720 hours.   Chemistry Recent Labs  Lab 04/23/24 0223 04/23/24 1335 04/24/24 0653 04/24/24 1756 04/25/24 0535 04/25/24 0745 04/25/24 1916  NA 132*   < > 134* 133*  --  136 137  K 3.0*   < > 2.9* 3.3*  --  3.8 3.8  CL 100   < > 103 105  --  112* 108  CO2 18*   < > 20* 20*  --  16* 21*  GLUCOSE 109*   < > 88 144*  --  159* 135*  BUN 68*   < > 70* 62*  --  49* 43*  CREATININE 1.85*   < > 1.53* 1.16  --  1.11 1.04  CALCIUM  8.5*   < > 8.2* 8.5*  --  8.7* 9.0  MG 2.0  --  2.1  --  2.2  --   --   GFRNONAA 37*   < > 47* >60  --  >60 >60  ANIONGAP 14   < > 11 8  --  8 8   < > = values in this interval not displayed.    Recent Labs  Lab 04/21/24 1951 04/22/24 0331 04/23/24 0223  PROT 7.3 5.6* 5.7*  ALBUMIN 4.1 2.7* 2.6*  AST 22 27 52*  ALT 20 25 66*  ALKPHOS 86 54 60  BILITOT 0.8 0.7 0.5   Lipids  No results for input(s): CHOL, TRIG, HDL, LABVLDL, LDLCALC, CHOLHDL in the last 168 hours.  Hematology Recent Labs  Lab 04/21/24 1951 04/22/24 0331 04/23/24 0223  WBC 3.3* 4.7 5.0  RBC 4.19* 3.69* 4.17*  HGB 12.7* 11.1* 12.4*  HCT 38.4* 33.3* 38.2*  MCV 91.6 90.2 91.6  MCH 30.3 30.1 29.7  MCHC 33.1 33.3 32.5  RDW 19.2* 18.9* 18.8*  PLT 364 309 307   Thyroid   Recent Labs  Lab 04/22/24 0609  TSH 1.029    BNP Recent Labs  Lab 04/22/24 0607  BNP 844.7*    DDimer No results for input(s): DDIMER in the last 168 hours.  Radiology/Studies:  DG Abd 1 View Result Date: 04/25/2024 CLINICAL DATA:  Abdominal distension.  Ileus. EXAM: ABDOMEN - 1 VIEW COMPARISON:  04/23/2024. FINDINGS: Persistent air-filled mildly dilated colonic loops with the ascending colon measuring 9.3 cm (previously 9.7 cm). Several air-filled dilated central small bowel loops with slight increased number of dilated small bowel loops measuring up to 4.7 cm in diameter which is not significantly changed. No free peritoneal air. Remainder the exam is unchanged. IMPRESSION: Persistent air-filled mildly dilated colonic loops with slight increased number of dilated small bowel loops measuring up to 4.7 cm in diameter. Findings likely due to ileus. Electronically Signed   By: Toribio Agreste M.D.   On: 04/25/2024 14:56   DG Chest Port 1 View Result Date: 04/25/2024 EXAM: 1 VIEW(S) XRAY OF THE CHEST 04/25/2024 06:25:00 AM COMPARISON: 04/21/2024 CLINICAL HISTORY: SOB (shortness of breath) FINDINGS: LUNGS AND PLEURA: Low lung volumes. Elevated right hemidiaphragm, unchanged. Increasing bilateral opacities. No pleural effusion. No pneumothorax. HEART AND MEDIASTINUM: Cardiomegaly, unchanged. BONES AND SOFT TISSUES: Degenerative changes of right shoulder. IMPRESSION: 1. Increasing bilateral opacities. 2. Cardiomegaly, unchanged. 3. Low lung volumes and elevated right hemidiaphragm, unchanged. Electronically signed by:  Waddell Calk MD 04/25/2024 06:32 AM EST RP Workstation: [id number]   DG Abd 1 View Result Date: 04/23/2024 CLINICAL DATA:  Abdominal distension/ileus. EXAM: ABDOMEN - 1 VIEW COMPARISON:  KUB 02/20/2024, CT 04/21/2024 FINDINGS: Exam demonstrates continued evidence of multiple air-filled prominent colonic loops measuring up to 9.7 cm in diameter over the ascending colon as these findings are not significantly changed. Several air-filled mildly dilated distal small bowel loops measuring up to 4.7 cm slightly more prominent than the recent CT. No  free peritoneal air. Remainder of the exam is unchanged. IMPRESSION: Findings suggesting persistent colonic ileus. Slight increase in distal small bowel dilatation which may be due to ileus versus early obstruction. Electronically Signed   By: Toribio Agreste M.D.   On: 04/23/2024 11:53     Assessment and Plan: ***   Risk Assessment/Risk Scores: {Complete the following score calculators/questions to meet required metrics.  Press F2         :[id number]}        CHA2DS2-VASc Score =    {Click here to calculate score.  REFRESH note before signing. :1} This indicates a  % annual risk of stroke. The patient's score is based upon:          For questions or updates, please contact [facility] Please consult [email or web address] for contact info under    {TIP  Split Shared Billing  Do NOT delete any part of this including brackets If split shared billing is based upon MDM, disregard If billing will be based upon TIME you MUST document the number of minutes and a detailed list of what was done in that time in the following format Example - I spent ** minutes seeing this patient. During that time I reviewed their history, evaluated their symptoms, reviewed available labs, EKGs, studies, performed an exam and formulated an assessment and plan   :1} {Select this only if you need to document critical care time (Optional):[phone]} Signed, Laurene DELENA Pacini, MD  04/25/2024 11:58 PM

## 2024-04-25 NOTE — Progress Notes (Addendum)
 2120: Pt c/o back pain, abd distended, labored breathing. pt denied nausea, but spitting up. Was informed that pt on tele A. Fib HR ranging from 99-140s by Consulting Civil Engineer. Secure chat to Hospitalist. EKG performed. Hospitalist and RR RN came to floor to assess pt. Orders received to transfer pt to higher level care. 2330: report called to 4E RN.  2340: [name] (Wife) notified of transfer to 4E bed 17 and and pt symptoms.

## 2024-04-25 NOTE — Progress Notes (Addendum)
 PROGRESS NOTE    Chase Combs  FMW:[id number] DOB: 1945-07-03 DOA: 04/21/2024 PCP: Wonda Worth SQUIBB, PA  Subjective:  Noted to have signs of fluid overload overnight for CXR done with increasing bilateral opacities and IV fluids stopped. Seen and examined at bedside. Resting comfortably flat in bed and reports feeling better. Still having frequent liquid stools. Denies chest pain, cough, shortness of breath, nausea, vomiting, constipation.  Hospital Course: 78 y.o. 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 Of note, patient had recent complicated admission for septic shock, cardiogenic shock, intestinal pneumatosis/ileus/pseudoobstruction/Ogilvie syndrome. Admitted for recurrent Ogilvie syndrome, suspected gastroenterocolitis, AKI on CKD, hypotension and acute debilitation.   No concern for acute infection so not given any antibiotics. Loose stools seem to be starting to taper off. Managed conservatively with bowel rest, aggressive IV hydration, and aggressive electrolyte repletion. Renal function improved with IV hydration. IV fluids held given concern for possible fluid overload state overnight 12/1. Need to advance diet as tolerated and reintroduce GDMT for CHF as able.     Assessment and Plan:  Possible Ileus Recurrent Ogilvie Syndrome Improving slowly History of recent hospitalization (01/22/24-02/25/24) for similar GI symptoms with  extensive workup with assistance from general surgery and GI services. Underwent colonic decompression (02/18/24) with improvement.  Presented with nausea, vomiting, diarrhea Remains afebrile and without leukocytosis C diff and enteric PCR panel negative CT A/P w/o contrast with noted diffuse mild dilatation of colon, distended ileal loops and stomach Hold off on antibiotics for now Stopped IV fluids on 12/2 Advance full liquid to mechanical soft diet Zofran  PRN Aspiration precautions Plan to give a few days to improve with conservative care as above May need rectal tube if diarrhea gets persistent with worsening skin irritation/breakdown May need repeat colonic decompression if doesn't improve May need GI consult if not improving   Hypokalemia Recurrent in nature Likely in the setting of frequent diarrhea Monitor and replete electrolytes as needed   AKI on CKD Resolved Cr 1.11 < 2.1, baseline 0.9-1.5 Likely in the setting of dehydration from diarrhea CT A/P with no concern for obstructive disease Stopped IV fluids Encourage oral intake Holding home entresto , spironolactone , lasix . Consider resuming slowly in next 1-2 days if taking PO well, loose stool frequency decreases, and Cr remains stable Cont external foley for strict I/Os Avoid nephrotoxic agents Renal dose medications Monitor BMP   Hyponatremia Resolved Na 136 < 132 Hypovolemic in nature given ongoing diarrhea Stopped IV fluids Encourage oral intake Monitor BMP   Presyncope Hypotension Likely in the setting of dehydration/orthostatic hypotension from possible ileus/pseudoobstruction/gastroenterocolitis CT head unremarkable Given LR in ED and 54ml/hr LR on admission Status post 2 additional NS 500ml boluses Stopped IV fluids Encourage oral intake Will check orthostatic vitals Hold BP meds as elsewhere Monitor on tele   Non-ischemic myocardial injury CAD Recent balloon angioplasty of LAD stent  thrombosis (02/01/2024) due to inability to tolerate oral DAPT because of intractable nausea and vomiting History of remote PCI to RI and RCA EKG with known RBBB,  LAFB, no significant changes from prior Troponin 67 < 71 Cont home plavix , eliquis  Will need to touch base with cardiology and switch to IV alternatives if unable to take plavix  and eliquis  orally Monitor on tele Monitor clinically   Acute hypoxia Noted to have cough, shortness of breath, R > Lung crackles on ED presentation. Concern for orthopnea also present Likely in the setting of abdominal distension from underlying ileus/pseudoobstruction/gastroenterocolitis Currently on 2L oxygen, not on oxygen at home See management of ileus as elsewhere Wean oxygen as tolerated    CHFrEF Recent prolonged hospitalization complicated by cardiogenic shock with acute CHF exacerbation in the setting of LAD stent stenosis  Last echo 01/2024 with LVEF 20-25% CXR 12/2 reviewed. Unsure if the bilateral opacities are related to pulmonary edema vs atelectasis from distended abdomen due to ileus BNP on admission better than prior labs Stopped IV fluids as elsewhere Home home lasix , spironolactone , entresto . Consider resuming slowly in next 1-2 days if taking PO well, loose stool frequency decreases, and Cr remains stable Monitor I/Os, daily weights, renal function, electrolytes   Paroxysmal Afib S/p TEE CV on 02/21/2024 Dig level 0.7 Cont home amiodarone  Cont home digoxin  as AKI resolved Cont home eliquis  Recheck digoxin  level on 12/5, ordered Monitor on tele   Peptic ulcer disease Recent hospitalization with EGD (02/02/24) showing esophageal ulcer without bleeding, grade C reflux esophagitis without bleeding, duodenal erosions At home on pantoprazole  40mg  daily Cont pantoprazole  Monitor for signs/symptoms of acute bleed   Hypomagnesemia Monitor and replete electrolytes as needed   T2DM Hold home empagliflozin  Cont SSI with FS q6h while  NPO   Incidental R thyroid  nodule Noted on CT C spine TSH normal Will need outpatient thyroid  US    Chronic anemia Hgb 11.2, baseline 10-12 Cont anticoagulation as elsewhere Monitor clinically   HLD Cont home statin   Acute debilitation Mechanical fall Monitor on fall precautions PT/OT following  DVT prophylaxis: SCDs Start: 04/22/24 0137 apixaban  (ELIQUIS ) tablet 5 mg  Eliquis    Code Status: Limited: Do not attempt resuscitation (DNR) -DNR-LIMITED -Do Not Intubate/DNI  Disposition Plan: Likely SNF Reason for continuing need for hospitalization: severity of illness, placement pending, SNF following  Objective: Vitals:   04/24/24 2348 04/25/24 0445 04/25/24 0458 04/25/24 0743  BP: (!) 116/57  99/78 (!) 111/99  Pulse: (!) 43  62 81  Resp:   17 18  Temp: 97.8 F (36.6 C)   (!) 97 F (36.1 C)  TempSrc:      SpO2: 93%  91% 92%  Weight:  87.9 kg    Height:        Intake/Output Summary (Last 24 hours) at 04/25/2024 1121 Last data filed at 04/25/2024 0500 Gross per 24 hour  Intake 2902.12 ml  Output 1000 ml  Net 1902.12 ml   Filed Weights   04/23/24 0500 04/24/24 0500 04/25/24 0445  Weight: 83.5 kg 83.5 kg 87.9 kg    Examination:  Physical Exam Vitals and nursing note reviewed.  Constitutional:      General: He is not in acute distress.    Appearance: He is obese. He is ill-appearing.  HENT:     Head: Normocephalic and atraumatic.  Cardiovascular:     Rate and Rhythm: Normal rate and regular rhythm.     Pulses: Normal pulses.     Heart sounds: Normal heart sounds.  Pulmonary:     Effort: Pulmonary effort is normal.     Breath sounds: Normal breath sounds.  Abdominal:  General: Bowel sounds are normal. There is distension.     Palpations: Abdomen is soft. There is no mass.     Tenderness: There is no abdominal tenderness. There is no guarding or rebound.     Hernia: No hernia is present.  Neurological:     Mental Status: He is alert.     Data  Reviewed: I have personally reviewed following labs and imaging studies  CBC: Recent Labs  Lab 04/21/24 1951 04/22/24 0331 04/23/24 0223  WBC 3.3* 4.7 5.0  NEUTROABS  --  2.9 2.9  HGB 12.7* 11.1* 12.4*  HCT 38.4* 33.3* 38.2*  MCV 91.6 90.2 91.6  PLT 364 309 307   Basic Metabolic Panel: Recent Labs  Lab 04/22/24 0331 04/22/24 1351 04/23/24 0223 04/23/24 1335 04/24/24 0653 04/24/24 1756 04/25/24 0535 04/25/24 0745  NA 129* 135 132* 132* 134* 133*  --  136  K 3.2* 3.4* 3.0* 3.2* 2.9* 3.3*  --  3.8  CL 96* 101 100 101 103 105  --  112*  CO2 23 19* 18* 18* 20* 20*  --  16*  GLUCOSE 127* 104* 109* 90 88 144*  --  159*  BUN 46* 59* 68* 79* 70* 62*  --  49*  CREATININE 2.10* 2.05* 1.85* 2.17* 1.53* 1.16  --  1.11  CALCIUM  8.4* 8.5* 8.5* 8.0* 8.2* 8.5*  --  8.7*  MG 1.6* 2.0 2.0  --  2.1  --  2.2  --    GFR: Estimated Creatinine Clearance: 62.1 mL/min (by C-G formula based on SCr of 1.11 mg/dL). Liver Function Tests: Recent Labs  Lab 04/21/24 1951 04/22/24 0331 04/23/24 0223  AST 22 27 52*  ALT 20 25 66*  ALKPHOS 86 54 60  BILITOT 0.8 0.7 0.5  PROT 7.3 5.6* 5.7*  ALBUMIN 4.1 2.7* 2.6*   No results for input(s): LIPASE, AMYLASE in the last 168 hours. No results for input(s): AMMONIA in the last 168 hours. Coagulation Profile: Recent Labs  Lab 04/21/24 1951  INR 1.7*   Cardiac Enzymes: No results for input(s): CKTOTAL, CKMB, CKMBINDEX, TROPONINI in the last 168 hours. ProBNP, BNP (last 5 results) Recent Labs    01/07/24 2301 01/11/24 0539 01/22/24 1243 03/09/24 1444 03/23/24 1456 04/07/24 1235 04/22/24 0607  PROBNP 15,245.0*  --   --   --   --   --   --   BNP  --    < > 220.4* 618.6* 1,985.4* 940.4* 844.7*   < > = values in this interval not displayed.   HbA1C: No results for input(s): HGBA1C in the last 72 hours. CBG: Recent Labs  Lab 04/24/24 1723 04/24/24 2145 04/24/24 2306 04/25/24 0454 04/25/24 0812  GLUCAP 131* 142* 158*  170* 136*   Lipid Profile: No results for input(s): CHOL, HDL, LDLCALC, TRIG, CHOLHDL, LDLDIRECT in the last 72 hours. Thyroid  Function Tests: No results for input(s): TSH, T4TOTAL, FREET4, T3FREE, THYROIDAB in the last 72 hours. Anemia Panel: No results for input(s): VITAMINB12, FOLATE, FERRITIN, TIBC, IRON, RETICCTPCT in the last 72 hours. Sepsis Labs: Recent Labs  Lab 04/21/24 2240 04/22/24 0607 04/22/24 0843  PROCALCITON  --  8.67  --   LATICACIDVEN 1.2  --  1.6    Recent Results (from the past 240 hours)  Resp panel by RT-PCR (RSV, Flu A&B, Covid) Anterior Nasal Swab     Status: None   Collection Time: 04/21/24  7:51 PM   Specimen: Anterior Nasal Swab  Result Value Ref Range Status  SARS Coronavirus 2 by RT PCR NEGATIVE NEGATIVE Final    Comment: (NOTE) SARS-CoV-2 target nucleic acids are NOT DETECTED.  The SARS-CoV-2 RNA is generally detectable in upper respiratory specimens during the acute phase of infection. The lowest concentration of SARS-CoV-2 viral copies this assay can detect is 138 copies/mL. A negative result does not preclude SARS-Cov-2 infection and should not be used as the sole basis for treatment or other patient management decisions. A negative result may occur with  improper specimen collection/handling, submission of specimen other than nasopharyngeal swab, presence of viral mutation(s) within the areas targeted by this assay, and inadequate number of viral copies(<138 copies/mL). A negative result must be combined with clinical observations, patient history, and epidemiological information. The expected result is Negative.  Fact Sheet for Patients:  bloggercourse.com  Fact Sheet for Healthcare Providers:  seriousbroker.it  This test is no t yet approved or cleared by the [place]  FDA and  has been authorized for detection and/or diagnosis of SARS-CoV-2  by FDA under an Emergency Use Authorization (EUA). This EUA will remain  in effect (meaning this test can be used) for the duration of the COVID-19 declaration under Section 564(b)(1) of the Act, 21 U.S.C.section 360bbb-3(b)(1), unless the authorization is terminated  or revoked sooner.       Influenza A by PCR NEGATIVE NEGATIVE Final   Influenza B by PCR NEGATIVE NEGATIVE Final    Comment: (NOTE) The Xpert Xpress SARS-CoV-2/FLU/RSV plus assay is intended as an aid in the diagnosis of influenza from Nasopharyngeal swab specimens and should not be used as a sole basis for treatment. Nasal washings and aspirates are unacceptable for Xpert Xpress SARS-CoV-2/FLU/RSV testing.  Fact Sheet for Patients: bloggercourse.com  Fact Sheet for Healthcare Providers: seriousbroker.it  This test is not yet approved or cleared by the [place]  FDA and has been authorized for detection and/or diagnosis of SARS-CoV-2 by FDA under an Emergency Use Authorization (EUA). This EUA will remain in effect (meaning this test can be used) for the duration of the COVID-19 declaration under Section 564(b)(1) of the Act, 21 U.S.C. section 360bbb-3(b)(1), unless the authorization is terminated or revoked.     Resp Syncytial Virus by PCR NEGATIVE NEGATIVE Final    Comment: (NOTE) Fact Sheet for Patients: bloggercourse.com  Fact Sheet for Healthcare Providers: seriousbroker.it  This test is not yet approved or cleared by the [place]  FDA and has been authorized for detection and/or diagnosis of SARS-CoV-2 by FDA under an Emergency Use Authorization (EUA). This EUA will remain in effect (meaning this test can be used) for the duration of the COVID-19 declaration under Section 564(b)(1) of the Act, 21 U.S.C. section 360bbb-3(b)(1), unless the authorization is terminated or revoked.  Performed at [facility], [address] [place], [place] [address]   C Difficile Quick Screen w PCR reflex     Status: None   Collection Time: 04/22/24  6:33 PM   Specimen: STOOL  Result Value Ref Range Status   C Diff antigen NEGATIVE NEGATIVE Final   C Diff toxin NEGATIVE NEGATIVE Final   C Diff interpretation No C. difficile detected.  Final    Comment: Performed at [facility], [address], [place], [place] [address]  Gastrointestinal Panel by PCR , Stool     Status: None   Collection Time: 04/22/24  6:33 PM   Specimen: Stool  Result Value Ref Range Status   Campylobacter species NOT DETECTED NOT DETECTED Final   Plesimonas shigelloides  NOT DETECTED NOT DETECTED Final   Salmonella species NOT DETECTED NOT DETECTED Final   Yersinia enterocolitica NOT DETECTED NOT DETECTED Final   Vibrio species NOT DETECTED NOT DETECTED Final   Vibrio cholerae NOT DETECTED NOT DETECTED Final   Enteroaggregative E coli (EAEC) NOT DETECTED NOT DETECTED Final   Enteropathogenic E coli (EPEC) NOT DETECTED NOT DETECTED Final   Enterotoxigenic E coli (ETEC) NOT DETECTED NOT DETECTED Final   Shiga like toxin producing E coli (STEC) NOT DETECTED NOT DETECTED Final   Shigella/Enteroinvasive E coli (EIEC) NOT DETECTED NOT DETECTED Final   Cryptosporidium NOT DETECTED NOT DETECTED Final   Cyclospora cayetanensis NOT DETECTED NOT DETECTED Final   Entamoeba histolytica NOT DETECTED NOT DETECTED Final   Giardia lamblia NOT DETECTED NOT DETECTED Final   Adenovirus F40/41 NOT DETECTED NOT DETECTED Final   Astrovirus NOT DETECTED NOT DETECTED Final   Norovirus GI/GII NOT DETECTED NOT DETECTED Final   Rotavirus A NOT DETECTED NOT DETECTED Final   Sapovirus (I, II, IV, and V) NOT DETECTED NOT DETECTED Final    Comment: Performed at [facility], [address] [place], [place] [address]     Radiology Studies: DG Chest Port 1 View Result Date: 04/25/2024 EXAM: 1 VIEW(S) XRAY OF THE  CHEST 04/25/2024 06:25:00 AM COMPARISON: 04/21/2024 CLINICAL HISTORY: SOB (shortness of breath) FINDINGS: LUNGS AND PLEURA: Low lung volumes. Elevated right hemidiaphragm, unchanged. Increasing bilateral opacities. No pleural effusion. No pneumothorax. HEART AND MEDIASTINUM: Cardiomegaly, unchanged. BONES AND SOFT TISSUES: Degenerative changes of right shoulder. IMPRESSION: 1. Increasing bilateral opacities. 2. Cardiomegaly, unchanged. 3. Low lung volumes and elevated right hemidiaphragm, unchanged. Electronically signed by: Waddell Calk MD 04/25/2024 06:32 AM EST RP Workstation: [id number]    Scheduled Meds:  amiodarone   200 mg Oral Daily   apixaban   5 mg Oral BID   clopidogrel   75 mg Oral Daily   digoxin   0.0625 mg Oral Daily   fiber supplement (BANATROL TF)  60 mL Oral BID   insulin  aspart  0-6 Units Subcutaneous Q6H   lactose free nutrition  237 mL Oral TID WC   pantoprazole   40 mg Oral Daily   rosuvastatin   20 mg Oral Daily   Continuous Infusions:  sodium chloride  Stopped (04/25/24 0500)     LOS: 3 days   Norval Bar, MD  [facility]  04/25/2024, 11:21 AM

## 2024-04-25 NOTE — Evaluation (Signed)
 Physical Therapy Evaluation Patient Details Name: Chase Combs MRN: [id number] DOB: 22-Jun-1945 Today's Date: 04/25/2024  History of Present Illness  Pt is a 78 y.o. male admitted 11/28 with enterocolitis, presyncope, and hypotension. Multiple recent hospitalizations for cardiac and GI issues.PMH: Ogilvie syndrome s/p colonic decompression, NSTEMI, CAD, heart failure, a fib, HTN, HLD, DMII, CKD, OSA, prostate ca   Clinical Impression  Pt admitted with above diagnosis. PTA pt lived at home with wife, I/mod I and driving. Pt currently with functional limitations due to the deficits listed below (see PT Problem List). On eval, pt required CGA bed mobility, and demo good sitting balance EOB. Unable to attempt standing/OOB due to profound weakness. Pt will benefit from acute skilled PT to increase their independence and safety with mobility to allow discharge. Post acute, pt would benefit from further therapy in inpatient setting < 3 hours/day. This recommendation is primarily based on current activity tolerance level and multiple recent hospitalizations. Pt is hopeful to progress to home with home health.           If plan is discharge home, recommend the following: A lot of help with walking and/or transfers;A lot of help with bathing/dressing/bathroom;Assistance with cooking/housework;Help with stairs or ramp for entrance   Can travel by private vehicle   Yes    Equipment Recommendations None recommended by PT  Recommendations for Other Services       Functional Status Assessment Patient has had a recent decline in their functional status and demonstrates the ability to make significant improvements in function in a reasonable and predictable amount of time.     Precautions / Restrictions Precautions Precautions: Fall;Other (comment) Recall of Precautions/Restrictions: Intact Precaution/Restrictions Comments: watch BP      Mobility  Bed Mobility Overal bed mobility: Needs  Assistance Bed Mobility: Supine to Sit, Sit to Supine     Supine to sit: Contact guard, Used rails, HOB elevated Sit to supine: HOB elevated, Contact guard assist   General bed mobility comments: increased time, assist for safety    Transfers                   General transfer comment: Pt reports he feels too weak to attempt standing.    Ambulation/Gait                  Stairs            Wheelchair Mobility     Tilt Bed    Modified Rankin (Stroke Patients Only)       Balance Overall balance assessment: Needs assistance Sitting-balance support: Feet supported Sitting balance-Leahy Scale: Good                                       Pertinent Vitals/Pain Pain Assessment Pain Assessment: No/denies pain    Home Living Family/patient expects to be discharged to:: Private residence Living Arrangements: Spouse/significant other Available Help at Discharge: Family;Available 24 hours/day Type of Home: House Home Access: Ramped entrance       Home Layout: One level Home Equipment: Agricultural Consultant (2 wheels);Rollator (4 wheels);Cane - single point;Lift chair;Grab bars - toilet;Tub bench;Wheelchair - It Trainer      Prior Function Prior Level of Function : Driving;Independent/Modified Independent             Mobility Comments: no AD at baseline       Extremity/Trunk Assessment  Upper Extremity Assessment Upper Extremity Assessment: Generalized weakness    Lower Extremity Assessment Lower Extremity Assessment: Generalized weakness    Cervical / Trunk Assessment Cervical / Trunk Assessment: Normal  Communication   Communication Communication: No apparent difficulties    Cognition Arousal: Alert Behavior During Therapy: WFL for tasks assessed/performed   PT - Cognitive impairments: Problem solving                         Following commands: Intact       Cueing Cueing Techniques:  Verbal cues     General Comments General comments (skin integrity, edema, etc.): VSS on RA    Exercises     Assessment/Plan    PT Assessment Patient needs continued PT services  PT Problem List Decreased strength       PT Treatment Interventions DME instruction;Therapeutic exercise;Gait training;Balance training;Functional mobility training;Therapeutic activities;Patient/family education    PT Goals (Current goals can be found in the Care Plan section)  Acute Rehab PT Goals Patient Stated Goal: get stronger and return home PT Goal Formulation: With patient Time For Goal Achievement: 05/09/24 Potential to Achieve Goals: Good    Frequency Min 2X/week     Co-evaluation               AM-PAC PT 6 Clicks Mobility  Outcome Measure Help needed turning from your back to your side while in a flat bed without using bedrails?: A Little Help needed moving from lying on your back to sitting on the side of a flat bed without using bedrails?: A Little Help needed moving to and from a bed to a chair (including a wheelchair)?: A Lot Help needed standing up from a chair using your arms (e.g., wheelchair or bedside chair)?: A Lot Help needed to walk in hospital room?: A Lot Help needed climbing 3-5 steps with a railing? : Total 6 Click Score: 13    End of Session   Activity Tolerance: Patient limited by fatigue Patient left: in bed;with call bell/phone within reach;with bed alarm set Nurse Communication: Mobility status PT Visit Diagnosis: Other abnormalities of gait and mobility (R26.89);Muscle weakness (generalized) (M62.81)    Time: 9176-9158 PT Time Calculation (min) (ACUTE ONLY): 18 min   Charges:   PT Evaluation $PT Eval Moderate Complexity: 1 Mod   PT General Charges $$ ACUTE PT VISIT: 1 Visit         Sari MATSU., PT  Office # [phone]   [name] 04/25/2024, 9:51 AM

## 2024-04-25 NOTE — Significant Event (Signed)
 Patient's nurse notified me that patient's abdomen is getting more distended and the patient also getting short of breath and in A-fib with RVR.[name]  On exam at bedside patient is appearing tachypneic.  Blood pressure was 94/68 pulse 120/min and A-fib with RVR.  Respiration around 24/min.  Maintaining sats more than 90%.  HEENT anicteric no pallor. Chest bilateral entry present. Abdomen distended bowel sounds not appreciated.  I reviewed patient's labs, medications and notes.  Patient has recent history of CAD status post PCI also recently admitted for Ogilvie's syndrome.  Patient has having progressively worsening abdominal distention.  I reviewed patient's recent x-ray done.  Also labs and medications.  I discussed with on-call general surgeon Dr. Paola at this time advised getting lactic acid level and consult GI for possible decompression of the colon.  I discussed with Dr. Charlanne on-call gastroenterologist for [facility].  Dr. Charlanne recommended getting labs including metabolic panel magnesium  KUB and rectal tube.  To keep patient NPO.  They will be seeing patient in consult.  I also consulted on-call cardiologist Dr. Gail with regarding to patient's A-fib with RVR and also recent PCI.  Will be starting patient on amiodarone  infusion heparin  infusion since patient is going to be n.p.o. and to continue Plavix .  They will be seeing patient in consult.  Patient will be transferred to progressive care.  Redia Cleaver MD.

## 2024-04-26 ENCOUNTER — Inpatient Hospital Stay (HOSPITAL_COMMUNITY)

## 2024-04-26 DIAGNOSIS — K5981 Ogilvie syndrome: Secondary | ICD-10-CM

## 2024-04-26 DIAGNOSIS — K529 Noninfective gastroenteritis and colitis, unspecified: Secondary | ICD-10-CM | POA: Diagnosis not present

## 2024-04-26 DIAGNOSIS — K567 Ileus, unspecified: Secondary | ICD-10-CM | POA: Diagnosis not present

## 2024-04-26 LAB — CBC WITH DIFFERENTIAL/PLATELET
Abs Immature Granulocytes: 0.14 K/uL — ABNORMAL HIGH (ref 0.00–0.07)
Basophils Absolute: 0.1 K/uL (ref 0.0–0.1)
Basophils Relative: 1 %
Eosinophils Absolute: 0.1 K/uL (ref 0.0–0.5)
Eosinophils Relative: 1 %
HCT: 39.7 % (ref 39.0–52.0)
Hemoglobin: 12.6 g/dL — ABNORMAL LOW (ref 13.0–17.0)
Immature Granulocytes: 2 %
Lymphocytes Relative: 10 %
Lymphs Abs: 0.8 K/uL (ref 0.7–4.0)
MCH: 29.4 pg (ref 26.0–34.0)
MCHC: 31.7 g/dL (ref 30.0–36.0)
MCV: 92.8 fL (ref 80.0–100.0)
Monocytes Absolute: 1.3 K/uL — ABNORMAL HIGH (ref 0.1–1.0)
Monocytes Relative: 16 %
Neutro Abs: 5.6 K/uL (ref 1.7–7.7)
Neutrophils Relative %: 70 %
Platelets: 378 K/uL (ref 150–400)
RBC: 4.28 MIL/uL (ref 4.22–5.81)
RDW: 19.3 % — ABNORMAL HIGH (ref 11.5–15.5)
Smear Review: NORMAL
WBC: 8 K/uL (ref 4.0–10.5)
nRBC: 0 % (ref 0.0–0.2)

## 2024-04-26 LAB — TROPONIN I (HIGH SENSITIVITY)
Troponin I (High Sensitivity): 38 ng/L — ABNORMAL HIGH (ref <18)
Troponin I (High Sensitivity): 39 ng/L — ABNORMAL HIGH (ref <18)

## 2024-04-26 LAB — BASIC METABOLIC PANEL WITH GFR
Anion gap: 4 — ABNORMAL LOW (ref 5–15)
Anion gap: 9 (ref 5–15)
BUN: 44 mg/dL — ABNORMAL HIGH (ref 8–23)
BUN: 45 mg/dL — ABNORMAL HIGH (ref 8–23)
CO2: 22 mmol/L (ref 22–32)
CO2: 22 mmol/L (ref 22–32)
Calcium: 8.4 mg/dL — ABNORMAL LOW (ref 8.9–10.3)
Calcium: 8.7 mg/dL — ABNORMAL LOW (ref 8.9–10.3)
Chloride: 107 mmol/L (ref 98–111)
Chloride: 110 mmol/L (ref 98–111)
Creatinine, Ser: 1.09 mg/dL (ref 0.61–1.24)
Creatinine, Ser: 1.1 mg/dL (ref 0.61–1.24)
GFR, Estimated: >60 mL/min (ref >60)
GFR, Estimated: >60 mL/min (ref >60)
Glucose, Bld: 157 mg/dL — ABNORMAL HIGH (ref 70–99)
Glucose, Bld: 169 mg/dL — ABNORMAL HIGH (ref 70–99)
Potassium: 3.9 mmol/L (ref 3.5–5.1)
Potassium: 4.3 mmol/L (ref 3.5–5.1)
Sodium: 136 mmol/L (ref 135–145)
Sodium: 138 mmol/L (ref 135–145)

## 2024-04-26 LAB — GLUCOSE, CAPILLARY
Glucose-Capillary: 125 mg/dL — ABNORMAL HIGH (ref 70–99)
Glucose-Capillary: 136 mg/dL — ABNORMAL HIGH (ref 70–99)
Glucose-Capillary: 145 mg/dL — ABNORMAL HIGH (ref 70–99)
Glucose-Capillary: 146 mg/dL — ABNORMAL HIGH (ref 70–99)
Glucose-Capillary: 152 mg/dL — ABNORMAL HIGH (ref 70–99)
Glucose-Capillary: 152 mg/dL — ABNORMAL HIGH (ref 70–99)
Glucose-Capillary: 157 mg/dL — ABNORMAL HIGH (ref 70–99)
Glucose-Capillary: 158 mg/dL — ABNORMAL HIGH (ref 70–99)

## 2024-04-26 LAB — LACTIC ACID, PLASMA
Lactic Acid, Venous: 1 mmol/L (ref 0.5–1.9)
Lactic Acid, Venous: 1.2 mmol/L (ref 0.5–1.9)

## 2024-04-26 LAB — MAGNESIUM: Magnesium: 2.3 mg/dL (ref 1.7–2.4)

## 2024-04-26 LAB — BRAIN NATRIURETIC PEPTIDE: B Natriuretic Peptide: 1984.7 pg/mL — ABNORMAL HIGH (ref 0.0–100.0)

## 2024-04-26 LAB — HEPARIN LEVEL (UNFRACTIONATED): Heparin Unfractionated: >1.1 [IU]/mL — ABNORMAL HIGH (ref 0.30–0.70)

## 2024-04-26 LAB — TYPE AND SCREEN: ABO/RH(D): O POS

## 2024-04-26 LAB — APTT: aPTT: 63 s — ABNORMAL HIGH (ref 24–36)

## 2024-04-26 MED ORDER — AMIODARONE LOAD VIA INFUSION
150.0000 mg | Freq: Once | INTRAVENOUS | Status: AC
  Administered 2024-04-26: 150 mg via INTRAVENOUS
  Filled 2024-04-26: qty 83.34

## 2024-04-26 MED ORDER — HEPARIN (PORCINE) 25000 UT/250ML-% IV SOLN
1300.0000 [IU]/h | INTRAVENOUS | Status: DC
  Administered 2024-04-26: 1200 [IU]/h via INTRAVENOUS
  Administered 2024-04-27: 1300 [IU]/h via INTRAVENOUS
  Filled 2024-04-26 (×2): qty 250

## 2024-04-26 MED ORDER — BISACODYL 10 MG RE SUPP
10.0000 mg | RECTAL | Status: AC
  Administered 2024-04-26: 10 mg via RECTAL
  Filled 2024-04-26: qty 1

## 2024-04-26 MED ORDER — AMIODARONE HCL IN DEXTROSE 360-4.14 MG/200ML-% IV SOLN
60.0000 mg/h | INTRAVENOUS | Status: DC
  Administered 2024-04-26 (×2): 60 mg/h via INTRAVENOUS
  Filled 2024-04-26: qty 200

## 2024-04-26 MED ORDER — AMIODARONE HCL IN DEXTROSE 360-4.14 MG/200ML-% IV SOLN
30.0000 mg/h | INTRAVENOUS | Status: DC
  Administered 2024-04-26 – 2024-05-01 (×10): 30 mg/h via INTRAVENOUS
  Filled 2024-04-26 (×11): qty 200

## 2024-04-26 MED ORDER — INSULIN ASPART 100 UNIT/ML IJ SOLN
0.0000 [IU] | INTRAMUSCULAR | Status: DC
  Administered 2024-04-26 – 2024-05-07 (×6): 1 [IU] via SUBCUTANEOUS
  Filled 2024-04-26 (×6): qty 1

## 2024-04-26 MED ORDER — PANTOPRAZOLE SODIUM 40 MG IV SOLR
40.0000 mg | INTRAVENOUS | Status: DC
  Administered 2024-04-26 – 2024-05-02 (×7): 40 mg via INTRAVENOUS
  Filled 2024-04-26 (×7): qty 10

## 2024-04-26 NOTE — Progress Notes (Signed)
 PROGRESS NOTE  Chase Combs FMW:[id number] DOB: 09-12-1945 DOA: 04/21/2024 PCP: Wonda Worth SQUIBB, PA  HPI/Recap of past 24 hours: 78 y.o. 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 Of note, patient had recent complicated admission for septic shock, cardiogenic shock, intestinal pneumatosis/ileus/pseudoobstruction/Ogilvie syndrome. Now admitted for recurrent Ogilvie syndrome, suspected gastroenterocolitis, AKI on CKD, hypotension and acute debilitation.      Today, pt abdomen noted to be significantly distended/tight, appears somewhat uncomfortable.  Patient denies any chest pain, nausea/vomiting, fever/chills.  Noted to be requiring about 3 L of O2.   Assessment/Plan: Principal Problem:   Enterocolitis Active Problems:   Chronic systolic CHF (congestive heart failure) (HCC)   Hypokalemia   HLD (hyperlipidemia)   Acute kidney injury   DM2 (diabetes mellitus, type 2) (HCC)   Postural dizziness with presyncope   Elevated troponin   Fall at home, initial encounter   Hypotension   Paroxysmal atrial flutter (HCC)   Anemia of chronic disease   Diffuse progressive ileus Recurrent Ogilvie Syndrome History of recent hospitalization (01/22/24-02/25/24) for similar GI symptoms with extensive workup. Underwent colonic decompression (02/18/24) with improvement Currently afebrile, with no leukocytosis C diff and enteric PCR panel negative CT A/P w/o contrast with noted diffuse mild dilatation of  colon, distended ileal loops and stomach Repeat abdominal x-ray on 12/3 showed diffuse ileus involving colon and small bowel GI consulted, appreciate recs, plan for tapwater enema today, if no improvement may consider colonic decompression versus neostigmine  Continue clear liquids Aspiration precautions Monitor closely  Acute hypoxic respiratory failure Currently requiring about 3 L of O2 Likely in the setting of abdominal distension from underlying ileus/pseudoobstruction/gastroenterocolitis in addition to advanced CHF (required IV fluid resuscitation due to AKI) Discontinued IV fluids for now Supplemental O2 as needed  CHFrEF Ischemic cardiomyopathy Recent prolonged hospitalization complicated by cardiogenic shock with acute CHF exacerbation in the setting of LAD stent stenosis  BNP 1984 Last echo 01/2024 with LVEF 20-25% CXR 12/2 showed bilateral opacities Hold home lasix , spironolactone , entresto , digoxin , Jardiance  Monitor I/Os, daily weights, renal function  Paroxysmal Afib/flutter with RVR Heart rate improvement S/p TEE CV on 02/21/2024 Cardiology on board, appreciate recs Continue IV amiodarone , IV heparin  (Eliquis  held) Telemetry  CAD s/p DES to LAD 8/25 Hyperlipidemia Recent balloon angioplasty of LAD stent thrombosis (02/01/2024) due to inability to tolerate oral DAPT  History of remote PCI to RI and RCA EKG with known RBBB, LAFB, no significant changes from prior Troponin 67 < 71 Cardiology consulted, appreciate recs Cont home plavix , currently on IV heparin , held Eliquis , continue Crestor  Telemetry  Hypotension Improving Likely in the setting of dehydration/orthostatic hypotension from possible ileus/pseudoobstruction/gastroenterocolitis CT head unremarkable S/p IV fluids Clear liquid diet Continue to hold BP meds  Hypokalemia/hypomagnesemia Likely in the setting of ileus, frequent diarrhea Monitor and replete electrolytes as needed   AKI on  CKD Resolved Cr 1.11 < 2.1, baseline 0.9-1.5 Likely in the setting of dehydration from diarrhea CT A/P with no concern for obstructive disease Holding home entresto , spironolactone , lasix  Cont external foley for strict I/Os Avoid nephrotoxic agents Renal dose  medications Monitor BMP   Peptic ulcer disease Recent hospitalization with EGD (02/02/24) showing esophageal ulcer without bleeding, grade C reflux esophagitis without bleeding, duodenal erosions Cont pantoprazole    T2DM Hold home Jardiance  Cont SSI   Incidental R thyroid  nodule Noted on CT C spine TSH normal Will need outpatient thyroid  US    Chronic normocytic anemia Hgb 11.2, baseline 10-12 Daily CBC   Acute debilitation Mechanical fall Monitor on fall precautions PT/OT following- rec SNF    Malnutrition Type:  Nutrition Problem: Moderate Malnutrition Etiology: chronic illness   Malnutrition Characteristics:  Signs/Symptoms: moderate fat depletion, severe muscle depletion, moderate muscle depletion   Nutrition Interventions:  Interventions: Refer to RD note for recommendations    Estimated body mass index is 26.61 kg/m as calculated from the following:   Height as of this encounter: 6' (1.829 m).   Weight as of this encounter: 89 kg.     Code Status: DNR  Family Communication: None at bedside  Disposition Plan: Status is: Inpatient Remains inpatient appropriate because: Level of care      Consultants: GI Cardiology  Procedures: None  Antimicrobials: None  DVT prophylaxis: IV heparin    Objective: Vitals:   04/26/24 0642 04/26/24 0809 04/26/24 1200 04/26/24 1225  BP: 126/89 110/62 (!) 143/78 (!) 135/99  Pulse: 76 (!) 51 (!) 21 94  Resp: 20 (!) 27 (!) 21 (!) 24  Temp:  97.8 F (36.6 C)  97.6 F (36.4 C)  TempSrc:    Oral  SpO2: 95% 92% 99% 98%  Weight:      Height:        Intake/Output Summary (Last 24 hours) at 04/26/2024 1351 Last data filed at 04/26/2024 0536 Gross  per 24 hour  Intake 200 ml  Output 800 ml  Net -600 ml   Filed Weights   04/24/24 0500 04/25/24 0445 04/26/24 0535  Weight: 83.5 kg 87.9 kg 89 kg    Exam: General: NAD, awake/alert Cardiovascular: S1, S2 present Respiratory: CTAB Abdomen: Firm, nontender, ++distended, bowel sounds present Musculoskeletal: No bilateral pedal edema noted Skin: Normal Psychiatry: fair mood     Data Reviewed: CBC: Recent Labs  Lab 04/21/24 1951 04/22/24 0331 04/23/24 0223 04/25/24 2339  WBC 3.3* 4.7 5.0 8.0  NEUTROABS  --  2.9 2.9 5.6  HGB 12.7* 11.1* 12.4* 12.6*  HCT 38.4* 33.3* 38.2* 39.7  MCV 91.6 90.2 91.6 92.8  PLT 364 309 307 378   Basic Metabolic Panel: Recent Labs  Lab 04/22/24 1351 04/23/24 0223 04/23/24 1335 04/24/24 0653 04/24/24 1756 04/25/24 0535 04/25/24 0745 04/25/24 1916 04/25/24 2339 04/26/24 0734  NA 135 132*   < > 134* 133*  --  136 137 136 138  K 3.4* 3.0*   < > 2.9* 3.3*  --  3.8 3.8 4.3 3.9  CL 101 100   < > 103 105  --  112* 108 110 107  CO2 19* 18*   < > 20* 20*  --  16* 21* 22 22  GLUCOSE 104* 109*   < > 88 144*  --  159* 135* 169* 157*  BUN 59* 68*   < > 70* 62*  --  49* 43* 45* 44*  CREATININE 2.05* 1.85*   < > 1.53* 1.16  --  1.11 1.04 1.09 1.10  CALCIUM  8.5* 8.5*   < > 8.2* 8.5*  --  8.7* 9.0 8.4* 8.7*  MG 2.0 2.0  --  2.1  --  2.2  --   --  2.3  --    < > =  values in this interval not displayed.   GFR: Estimated Creatinine Clearance: 62.7 mL/min (by C-G formula based on SCr of 1.1 mg/dL). Liver Function Tests: Recent Labs  Lab 04/21/24 1951 04/22/24 0331 04/23/24 0223  AST 22 27 52*  ALT 20 25 66*  ALKPHOS 86 54 60  BILITOT 0.8 0.7 0.5  PROT 7.3 5.6* 5.7*  ALBUMIN 4.1 2.7* 2.6*   No results for input(s): LIPASE, AMYLASE in the last 168 hours. No results for input(s): AMMONIA in the last 168 hours. Coagulation Profile: Recent Labs  Lab 04/21/24 1951  INR 1.7*   Cardiac Enzymes: No results for input(s): CKTOTAL,  CKMB, CKMBINDEX, TROPONINI in the last 168 hours. BNP (last 3 results) Recent Labs    01/07/24 2301  PROBNP 15,245.0*   HbA1C: No results for input(s): HGBA1C in the last 72 hours. CBG: Recent Labs  Lab 04/25/24 2100 04/26/24 0054 04/26/24 0412 04/26/24 0810 04/26/24 1053  GLUCAP 133* 157* 152* 152* 158*   Lipid Profile: No results for input(s): CHOL, HDL, LDLCALC, TRIG, CHOLHDL, LDLDIRECT in the last 72 hours. Thyroid  Function Tests: No results for input(s): TSH, T4TOTAL, FREET4, T3FREE, THYROIDAB in the last 72 hours. Anemia Panel: No results for input(s): VITAMINB12, FOLATE, FERRITIN, TIBC, IRON, RETICCTPCT in the last 72 hours. Urine analysis:    Component Value Date/Time   COLORURINE YELLOW 04/22/2024 1205   APPEARANCEUR CLEAR 04/22/2024 1205   LABSPEC >1.030 (H) 04/22/2024 1205   PHURINE 5.5 04/22/2024 1205   GLUCOSEU NEGATIVE 04/22/2024 1205   HGBUR NEGATIVE 04/22/2024 1205   BILIRUBINUR NEGATIVE 04/22/2024 1205   KETONESUR NEGATIVE 04/22/2024 1205   PROTEINUR NEGATIVE 04/22/2024 1205   UROBILINOGEN 0.2 02/17/2009 0645   NITRITE NEGATIVE 04/22/2024 1205   LEUKOCYTESUR NEGATIVE 04/22/2024 1205   Sepsis Labs: @LABRCNTIP (procalcitonin:4,lacticidven:4)  ) Recent Results (from the past 240 hours)  Resp panel by RT-PCR (RSV, Flu A&B, Covid) Anterior Nasal Swab     Status: None   Collection Time: 04/21/24  7:51 PM   Specimen: Anterior Nasal Swab  Result Value Ref Range Status   SARS Coronavirus 2 by RT PCR NEGATIVE NEGATIVE Final    Comment: (NOTE) SARS-CoV-2 target nucleic acids are NOT DETECTED.  The SARS-CoV-2 RNA is generally detectable in upper respiratory specimens during the acute phase of infection. The lowest concentration of SARS-CoV-2 viral copies this assay can detect is 138 copies/mL. A negative result does not preclude SARS-Cov-2 infection and should not be used as the sole basis for treatment or other  patient management decisions. A negative result may occur with  improper specimen collection/handling, submission of specimen other than nasopharyngeal swab, presence of viral mutation(s) within the areas targeted by this assay, and inadequate number of viral copies(<138 copies/mL). A negative result must be combined with clinical observations, patient history, and epidemiological information. The expected result is Negative.  Fact Sheet for Patients:  bloggercourse.com  Fact Sheet for Healthcare Providers:  seriousbroker.it  This test is no t yet approved or cleared by the [place]  FDA and  has been authorized for detection and/or diagnosis of SARS-CoV-2 by FDA under an Emergency Use Authorization (EUA). This EUA will remain  in effect (meaning this test can be used) for the duration of the COVID-19 declaration under Section 564(b)(1) of the Act, 21 U.S.C.section 360bbb-3(b)(1), unless the authorization is terminated  or revoked sooner.       Influenza A by PCR NEGATIVE NEGATIVE Final   Influenza B by PCR NEGATIVE NEGATIVE Final    Comment: (NOTE)  The Xpert Xpress SARS-CoV-2/FLU/RSV plus assay is intended as an aid in the diagnosis of influenza from Nasopharyngeal swab specimens and should not be used as a sole basis for treatment. Nasal washings and aspirates are unacceptable for Xpert Xpress SARS-CoV-2/FLU/RSV testing.  Fact Sheet for Patients: bloggercourse.com  Fact Sheet for Healthcare Providers: seriousbroker.it  This test is not yet approved or cleared by the [place]  FDA and has been authorized for detection and/or diagnosis of SARS-CoV-2 by FDA under an Emergency Use Authorization (EUA). This EUA will remain in effect (meaning this test can be used) for the duration of the COVID-19 declaration under Section 564(b)(1) of the Act, 21 U.S.C. section  360bbb-3(b)(1), unless the authorization is terminated or revoked.     Resp Syncytial Virus by PCR NEGATIVE NEGATIVE Final    Comment: (NOTE) Fact Sheet for Patients: bloggercourse.com  Fact Sheet for Healthcare Providers: seriousbroker.it  This test is not yet approved or cleared by the [place]  FDA and has been authorized for detection and/or diagnosis of SARS-CoV-2 by FDA under an Emergency Use Authorization (EUA). This EUA will remain in effect (meaning this test can be used) for the duration of the COVID-19 declaration under Section 564(b)(1) of the Act, 21 U.S.C. section 360bbb-3(b)(1), unless the authorization is terminated or revoked.  Performed at [facility], [address] [place], [place] [address]   C Difficile Quick Screen w PCR reflex     Status: None   Collection Time: 04/22/24  6:33 PM   Specimen: STOOL  Result Value Ref Range Status   C Diff antigen NEGATIVE NEGATIVE Final   C Diff toxin NEGATIVE NEGATIVE Final   C Diff interpretation No C. difficile detected.  Final    Comment: Performed at [facility], [address] [place], [place] [address]  Gastrointestinal Panel by PCR , Stool     Status: None   Collection Time: 04/22/24  6:33 PM   Specimen: Stool  Result Value Ref Range Status   Campylobacter species NOT DETECTED NOT DETECTED Final   Plesimonas shigelloides NOT DETECTED NOT DETECTED Final   Salmonella species NOT DETECTED NOT DETECTED Final   Yersinia enterocolitica NOT DETECTED NOT DETECTED Final   Vibrio species NOT DETECTED NOT DETECTED Final   Vibrio cholerae NOT DETECTED NOT DETECTED Final   Enteroaggregative E coli (EAEC) NOT DETECTED NOT DETECTED Final   Enteropathogenic E coli (EPEC) NOT DETECTED NOT DETECTED Final   Enterotoxigenic E coli (ETEC) NOT DETECTED NOT DETECTED Final   Shiga like toxin producing E coli (STEC) NOT DETECTED NOT DETECTED Final    Shigella/Enteroinvasive E coli (EIEC) NOT DETECTED NOT DETECTED Final   Cryptosporidium NOT DETECTED NOT DETECTED Final   Cyclospora cayetanensis NOT DETECTED NOT DETECTED Final   Entamoeba histolytica NOT DETECTED NOT DETECTED Final   Giardia lamblia NOT DETECTED NOT DETECTED Final   Adenovirus F40/41 NOT DETECTED NOT DETECTED Final   Astrovirus NOT DETECTED NOT DETECTED Final   Norovirus GI/GII NOT DETECTED NOT DETECTED Final   Rotavirus A NOT DETECTED NOT DETECTED Final   Sapovirus (I, II, IV, and V) NOT DETECTED NOT DETECTED Final    Comment: Performed at [facility], [address] [place], [place] [address]      Studies: DG Abd 1 View Result Date: 04/26/2024 CLINICAL DATA:  Ileus. EXAM: ABDOMEN - 1 VIEW COMPARISON:  04/25/2024 FINDINGS: Diffuse ileus involving colon and small bowel appears stable. It is difficult to accurately measure the cecum on the current  study. No signs of free air. IMPRESSION: Stable diffuse ileus involving colon and small bowel. Electronically Signed   By: Marcey Moan M.D.   On: 04/26/2024 09:14   DG Abd 1 View Result Date: 04/25/2024 CLINICAL DATA:  Abdominal distension.  Ileus. EXAM: ABDOMEN - 1 VIEW COMPARISON:  04/23/2024. FINDINGS: Persistent air-filled mildly dilated colonic loops with the ascending colon measuring 9.3 cm (previously 9.7 cm). Several air-filled dilated central small bowel loops with slight increased number of dilated small bowel loops measuring up to 4.7 cm in diameter which is not significantly changed. No free peritoneal air. Remainder the exam is unchanged. IMPRESSION: Persistent air-filled mildly dilated colonic loops with slight increased number of dilated small bowel loops measuring up to 4.7 cm in diameter. Findings likely due to ileus. Electronically Signed   By: Toribio Agreste M.D.   On: 04/25/2024 14:56    Scheduled Meds:  clopidogrel   75 mg Oral Daily   fiber supplement (BANATROL TF)  60 mL Oral BID   insulin   aspart  0-6 Units Subcutaneous Q4H   pantoprazole  (PROTONIX ) IV  40 mg Intravenous Q24H   rosuvastatin   20 mg Oral Daily    Continuous Infusions:  amiodarone  30 mg/hr (04/26/24 0700)   heparin  1,200 Units/hr (04/26/24 0841)     LOS: 4 days     Lebron JINNY Cage, MD [facility]  If 7PM-7AM, please contact night-coverage [email or web address] 04/26/2024, 1:51 PM

## 2024-04-26 NOTE — Plan of Care (Signed)

## 2024-04-26 NOTE — Progress Notes (Signed)
 PHARMACY - ANTICOAGULATION CONSULT NOTE  Pharmacy Consult for Heparin  (holding Apixaban ) Indication: atrial fibrillation  Allergies  Allergen Reactions   Codeine Nausea And Vomiting   Latex Rash    Oral blisters when used at dentist    Patient Measurements: Height: 6' (182.9 cm) Weight: 89 kg (196 lb 3.4 oz) IBW/kg (Calculated) : 77.6 HEPARIN  DW (KG): 85  Vital Signs: Temp: 97.6 F (36.4 C) (12/03 1700) Temp Source: Oral (12/03 1700) BP: 118/59 (12/03 1700) Pulse Rate: 54 (12/03 1700)  Labs: Recent Labs    04/25/24 1916 04/25/24 2339 04/26/24 0219 04/26/24 0734 04/26/24 1820  HGB  --  12.6*  --   --   --   HCT  --  39.7  --   --   --   PLT  --  378  --   --   --   APTT  --   --   --   --  63*  HEPARINUNFRC  --   --   --   --  >1.10*  CREATININE 1.04 1.09  --  1.10  --   TROPONINIHS  --  39* 38*  --   --     Estimated Creatinine Clearance: 62.7 mL/min (by C-G formula based on SCr of 1.1 mg/dL).   Medical History: Past Medical History:  Diagnosis Date   Abdominal hernia    per pt   Arthritis    hands   Benign localized prostatic hyperplasia with lower urinary tract symptoms (LUTS)    CAD (coronary artery disease) 05/2004   cardiologist--- dr anner;  positive stress test , had outpt cardiac cath 05-27-2004 stenosis RI;  06-18-2004 cath w/ PCI and BMS x1 to pRI;   STEMI 02-16-2009  s/p cath w/ PCI thrombectomy for total occluded RCA , BMS x1 to pRCA;  nuclear stress test 03-15-2012 low risk no ischemia but sig inferior-inferolateral infarct , ef 51%   Dyslipidemia    Essential hypertension    Heart murmur    History of COVID-19 05/2020   per pt mild symptoms that resolved   History of ST elevation myocardial infarction (STEMI) 02/16/2009   inferior STEMI   cath w/ pci w/ BMS to pRCA   History of urinary retention    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[facility]) 04/2021   urologist--- dr borden/ radiation oncology-- dr patrcia;  dx 12/ 2022,  Gleason 4+5, PSA  24.6, volume 94.6cc;  plan to start IMRT   OSA (obstructive sleep apnea)    per pt dx approx 2008, no cpap intolerant   PONV (postoperative nausea and vomiting)    Pre-diabetes    S/p bare metal coronary artery stent    01/ 2006  x1 BMS to pRI (CoStar study stent);  and 09/ 2010  x1 BMS to pRCA   Status post total knee replacement, 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 Will start heparin  this morning at 0730. Anticipate using aPTT to dose for now.   Initial aPTT slightly subtherapeutic on 1200 units/hr, anti-Xa level elevated as expected with apixaban  dose  Goal of Therapy:  Heparin  level 0.3-0.7 units/ml aPTT 66-102 seconds Monitor platelets by anticoagulation protocol: Yes   Plan:  Increase heparin  gtt to 1300 units/hr F/u 8 hour aPTT  Dorn Poot, PharmD, [facility] Clinical Pharmacist ED Pharmacist Phone # [phone] 04/26/2024  7:05 PM

## 2024-04-26 NOTE — Plan of Care (Signed)

## 2024-04-26 NOTE — Progress Notes (Signed)
  Progress Note  Patient Name: Chase Combs Date of Encounter: 04/26/2024 [facility] Cardiologist: Alm Clay, MD   Interval Summary    Resting in room Difficult to understand at times but A&O x 4 Reports stomach still feels tight and distended, no other complaints Pending evaluation from GI regarding possible ileus, recurrent Ogilvie syndrome  Rates are improving  Vital Signs Vitals:   04/26/24 0300 04/26/24 0535 04/26/24 0642 04/26/24 0809  BP: 118/63  126/89 110/62  Pulse: 98  76 (!) 51  Resp: 20  20 (!) 27  Temp: 97.9 F (36.6 C)   97.8 F (36.6 C)  TempSrc: Oral     SpO2: 96%  95% 92%  Weight:  89 kg    Height:       Intake/Output Summary (Last 24 hours) at 04/26/2024 1021 Last data filed at 04/26/2024 0536 Gross per 24 hour  Intake 200 ml  Output 800 ml  Net -600 ml      04/26/2024    5:35 AM 04/25/2024    4:45 AM 04/24/2024    5:00 AM  Last 3 Weights  Weight (lbs) 196 lb 3.4 oz 193 lb 12.6 oz 184 lb 1.4 oz  Weight (kg) 89 kg 87.9 kg 83.5 kg     Telemetry/ECG  A-Fib, rates 90-110s - Personally Reviewed  Physical Exam  GEN: No acute distress, on 3 L oxygen via [place], tired, difficult to understand but A&O x 3.   Neck: No JVD Cardiac: iRRR, no murmurs, rubs, or gallops.  Respiratory: Clear to auscultation bilaterally. GI:  Distended  MS: No edema  Assessment & Plan   Paroxysmal A-Fib/flutter with RVR Recently diagnosed, s/p recent DCCV  Home meds: amiodarone  200 mg daily, digoxin  0.0625 mg daily, Eliquis  5 mg BID Currently in A-Fib with HR 90-100s Started on IV amiodarone , remains on  Continue IV heparin  in setting of Eliquis    Coronary artery disease s/p DES to LAD 12/2023, prior PCI 2006, 2010 Hyperlipidemia  01/18/2024: HDL 37; LDL Cholesterol 49 04/23/2024: ALT 66  R/LHC 12/2023: severe single vessel CAD, stent to ostial/prox LAD, patent ramus intermedius stent, patent prox RCA stent  LHC 01/2024: ostially occluded stent with acute  stent thrombosis in LAD, PTCA with balloon dilation, successful  Complex antiplatelet therapy history: on DAPT then discontinued due to GI bleed, then started on IV cangrelor , then transitioned to Plavix  followed by stent thrombosis few hours later Finally placed on Plavix  75 mg daily as genetic 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  Continue Crestor  20 mg daily Continue Plavix  75 mg daily -- will discuss with MD regarding possible upcoming procedures   Ischemic cardiomyopathy  Hypertension LVEF 20-25%, RV moderately reduced  Home meds: digoxin  0.0625 mg daily, PO Lasix  40 mg BID with 20 mEq K BID, Entresto  24-26 mg BID, spironolactone  12.5 mg daily, Jardiance  10 mg daily No beta-blocker with recent low output  Appears euvolemic on exam  BP stable Renal function stable Continue daily weights, daily BMPs, strict I&O's   Per primary Possible ileus  Recurrent Ogilvie syndrome  Electrolyte disturbances AKI on CKD Peptic ulcer disease Diabetes Thyroid  nodule Chronic anemia Fall   For questions or updates, please contact [facility] Please consult [email or web address] for contact info under   Signed, [place] DELENA Donath, PA-C

## 2024-04-26 NOTE — Progress Notes (Signed)
 Tap water  enema given x 1, light brown watery stool noted

## 2024-04-26 NOTE — Progress Notes (Signed)
 2nd tap water  enema given, thin watery stool noted again.

## 2024-04-26 NOTE — Progress Notes (Addendum)
 Physical Therapy Treatment Patient Details Name: Chase Combs MRN: [id number] DOB: 17-Mar-1946 Today's Date: 04/26/2024   History of Present Illness Pt is a 78 y.o. male admitted 11/28 with enterocolitis, presyncope, and hypotension. Multiple recent hospitalizations for cardiac and GI issues.PMH: Ogilvie syndrome s/p colonic decompression, NSTEMI, CAD, heart failure, a fib, HTN, HLD, DMII, CKD, OSA, prostate ca    PT Comments  Pt received in supine agreeable to work with SPTA. Pt slightly lethargic throughout session c/o being dizzy, lightheaded, and nauseous sitting EOB. Supine BP 115/63 (78) HR 100 and BP sitting EOB 106/85 (94) HR 106. This is a decrease from prior BP in supine of 135/99 taken ~2 hours prior to session. Pt Min to Mod A (+2 safety/lines) with bed mobility and Max A+2 for squat pivot toward HOB. Pt deferred further EOB mobility, requesting to lay back down. MD present and requesting pt being positioned in sidelying for pressure relief and for ease of bowel movement. SPTA assisted in repositioning pt and educated him on the importance of mobility during his stay. Plan on using RW or Stedy with +2 to attempt OOB to BSC/chair transfers next session. Patient will benefit from continued inpatient follow up therapy, <3 hours/day.   If plan is discharge home, recommend the following: A lot of help with walking and/or transfers;A lot of help with bathing/dressing/bathroom;Assistance with cooking/housework;Assist for transportation;Help with stairs or ramp for entrance   Can travel by private vehicle     No  Equipment Recommendations  None recommended by PT    Recommendations for Other Services       Precautions / Restrictions Precautions Precautions: Fall;Other (comment) Recall of Precautions/Restrictions: Intact Precaution/Restrictions Comments: watch BP Restrictions Weight Bearing Restrictions Per Provider Order: No     Mobility  Bed Mobility Overal bed mobility: Needs  Assistance Bed Mobility: Rolling, Sidelying to Sit, Sit to Sidelying Rolling: Min assist Sidelying to sit: +2 for safety/equipment, Mod assist, Used rails, HOB elevated Supine to sit: Min assist, +2 for safety/equipment, HOB elevated     General bed mobility comments: Pt requires assist with lines and trunk support. Pt cued to use rails as needed to get EOB, pt requesting to use SPTA hand for better stability. Pt educated on log rolling for comfort due to rectal tube placement. Requires increased time.    Transfers Overall transfer level: Needs assistance Equipment used: 2 person hand held assist Transfers: Sit to/from Stand Sit to Stand: TotalA +2 physical assistance  Pt unable to stand fully on STS trial, so instead perform squat pivot as detailed below with +2 MaxA HHA and bed pad assist         General transfer comment: Attempted squat pivot toward HOB with +2 HHA and bed pad assist, limited propulsion achieved x2 trials          Modified Rankin (Stroke Patients Only)       Balance Overall balance assessment: Needs assistance Sitting-balance support: Single extremity supported, Feet supported Sitting balance-Leahy Scale: Fair Sitting balance - Comments: EOB                                    Communication Communication Communication: No apparent difficulties Factors Affecting Communication: Hearing impaired  Cognition Arousal: Lethargic Behavior During Therapy: Anxious   PT - Cognitive impairments: Attention, Initiation  PT - Cognition Comments: Pt slow to respond and frequently has eyes closed Spouse present and supportive, friend in/out of room to visit. Following commands: Intact      Cueing Cueing Techniques: Verbal cues  Exercises      General Comments General comments (skin integrity, edema, etc.): Pt max HR 135bpm. Pt repositioned in sidelying per MD request for pressure relief. Supine BP 115/63 (78) HR  100, BP sitting EOB 106/85 (94) HR 106. C/o being dizzy and lightheaded.      Pertinent Vitals/Pain Pain Assessment Pain Assessment: Faces Faces Pain Scale: Hurts even more Pain Intervention(s): Limited activity within patient's tolerance, Monitored during session, Repositioned    Home Living                          Prior Function            PT Goals (current goals can now be found in the care plan section) Acute Rehab PT Goals Patient Stated Goal: get stronger and return home PT Goal Formulation: With patient Time For Goal Achievement: 05/09/24 Progress towards PT goals: Progressing toward goals    Frequency    Min 2X/week      PT Plan      Co-evaluation              AM-PAC PT 6 Clicks Mobility   Outcome Measure  Help needed turning from your back to your side while in a flat bed without using bedrails?: A Lot Help needed moving from lying on your back to sitting on the side of a flat bed without using bedrails?: A Lot Help needed moving to and from a bed to a chair (including a wheelchair)?: Total Help needed standing up from a chair using your arms (e.g., wheelchair or bedside chair)?: Total Help needed to walk in hospital room?: Total Help needed climbing 3-5 steps with a railing? : Total 6 Click Score: 8    End of Session Equipment Utilized During Treatment: Oxygen Activity Tolerance: Patient limited by fatigue Patient left: in bed;with call bell/phone within reach;with family/visitor present Nurse Communication: Mobility status PT Visit Diagnosis: Other abnormalities of gait and mobility (R26.89);Muscle weakness (generalized) (M62.81)     Time: 1449-1510 PT Time Calculation (min) (ACUTE ONLY): 21 min  Charges:    $Therapeutic Activity: 8-22 mins PT General Charges $$ ACUTE PT VISIT: 1 Visit                     Johnnie Gaynelle RADDLE    [facility] Delayni Streed 04/26/2024, 4:14 PM

## 2024-04-26 NOTE — Progress Notes (Signed)
 PHARMACY - ANTICOAGULATION CONSULT NOTE  Pharmacy Consult for Heparin  (holding Apixaban ) Indication: atrial fibrillation  Allergies  Allergen Reactions   Codeine Nausea And Vomiting   Latex Rash    Oral blisters when used at dentist    Patient Measurements: Height: 6' (182.9 cm) Weight: 87.9 kg (193 lb 12.6 oz) IBW/kg (Calculated) : 77.6 HEPARIN  DW (KG): 85  Vital Signs: Temp: 97.8 F (36.6 C) (12/03 0037) Temp Source: Oral (12/03 0037) BP: 129/82 (12/03 0126) Pulse Rate: 65 (12/03 0126)  Labs: Recent Labs    04/25/24 0745 04/25/24 1916 04/25/24 2339  HGB  --   --  12.6*  HCT  --   --  39.7  PLT  --   --  378  CREATININE 1.11 1.04 1.09  TROPONINIHS  --   --  39*    Estimated Creatinine Clearance: 63.3 mL/min (by C-G formula based on SCr of 1.09 mg/dL).   Medical History: Past Medical History:  Diagnosis Date   Abdominal hernia    per pt   Arthritis    hands   Benign localized prostatic hyperplasia with lower urinary tract symptoms (LUTS)    CAD (coronary artery disease) 05/2004   cardiologist--- dr anner;  positive stress test , had outpt cardiac cath 05-27-2004 stenosis RI;  06-18-2004 cath w/ PCI and BMS x1 to pRI;   STEMI 02-16-2009  s/p cath w/ PCI thrombectomy for total occluded RCA , BMS x1 to pRCA;  nuclear stress test 03-15-2012 low risk no ischemia but sig inferior-inferolateral infarct , ef 51%   Dyslipidemia    Essential hypertension    Heart murmur    History of COVID-19 05/2020   per pt mild symptoms that resolved   History of ST elevation myocardial infarction (STEMI) 02/16/2009   inferior STEMI   cath w/ pci w/ BMS to pRCA   History of urinary retention    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[facility]) 04/2021   urologist--- dr borden/ radiation oncology-- dr patrcia;  dx 12/ 2022,  Gleason 4+5, PSA 24.6, volume 94.6cc;  plan to start IMRT   OSA (obstructive sleep apnea)    per pt dx approx 2008, no cpap intolerant   PONV (postoperative nausea  and vomiting)    Pre-diabetes    S/p bare metal coronary artery stent    01/ 2006  x1 BMS to pRI (CoStar study stent);  and 09/ 2010  x1 BMS to pRCA   Status post total knee replacement, 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 Will start heparin  this morning at 0730. Anticipate using aPTT to dose for now.   Goal of Therapy:  Heparin  level 0.3-0.7 units/ml aPTT 66-102 seconds Monitor platelets by anticoagulation protocol: Yes   Plan:  Start heparin  drip at 1200 units/hr Heparin  level and aPTT in 8 hours Daily CBC/Heparin  level/aPTT Monitor for bleeding  Lynwood Mckusick, PharmD, BCPS Clinical Pharmacist Phone: [phone]

## 2024-04-26 NOTE — Consult Note (Addendum)
 Consultation  Referring Provider: TR[facility]/Kakrakandy Primary Care Physician:  Wonda Worth SQUIBB, PA Primary Gastroenterologist:  Dr. Ozie only  Reason for Consultation: Progressive ileus/Ogilvie's  HPI: Chase Combs is a 78 y.o. 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  With sleep apnea, and history of prostate cancer.  Patient had been seen by our service in the spring 2025 after he had undergone total left knee replacement which was complicated by acute diffuse postop ileus.  He ultimately underwent flex sigmoid during that admission Which showed diverticulosis in the left colon and congested mucosa in the entire examined colon, no overt colitis. He was seen again on 01/28/2024 after STE MI and stenting x 2 mid August 2025.  Had a prolonged hospital course that was complicated by sepsis, intestinal pneumatosis and there was concern for ischemic colitis with associated ileus.  His picture was most consistent with a right sided ischemic colitis.  He developed acute GI bleeding and underwent EGD on 02/02/2024 that showed 1 superficial esophageal ulcer, grade C esophagitis and a few erosions in the duodenum. Sigmoidoscopy at that same setting with red blood noted in the rectum sigmoid colon and descending colon to the transverse colon no visible source, few medium sized diverticuli noted in the left colon and a nonbleeding superficial ulcer in the rectum. He had persistent problems with ileus/Ogilvie's.  Did not respond to Relistor .  He did have some initial response to neostigmine  of which he received several doses however this ultimately failed and he underwent colonoscopy on 02/18/2024 per Dr. Albertus for decompression.  Colonoscopy found to have moderate inflammation in a segmental pattern characterized by  altered vascularity erosions and shallow ulcers in the proximal transverse colon air and stool were removed and a colonic decompression tube was placed.  He did have good improvement and ultimate resolution of the ileus after placement of the decompression tube. He was readmitted on 04/21/2024 with a presyncopal episode secondary to hypotension.  By patient report he had been sick earlier that week with nausea vomiting and diarrhea as well as abdominal discomfort.  During that time he developed recurrent abdominal distention which has persisted since.\ He has had very little p.o. intake, says he has been having stools on a daily basis, 1 yesterday -liquid, nonbloody  CT of the abdomen and pelvis on admission showed diffuse mild dilation of the colon and air-fluid levels, no wall thickening, he also had distended ileal loops of bowel. KUB yesterday showed colonic distention with the a send and colon measuring 9.3 cm and also several loops of dilated small bowel measuring up to 4.7 cm.  KUB today-shows stable diffuse ileus involving the colon and small bowel  Hospital course this admission complicated by acute anemia injury on chronic kidney disease ongoing issues with nonischemic cardiomyopathy, heart failure. He had been hypoxic over the past couple of days now requiring oxygen at 3 L nasal cannula. Chest x-ray yesterday showed increasing bilateral opacities and low lung volumes  Labs 04/25/2024 WBC 8.0/hemoglobin 12.6/hematocrit 39.7 Potassium 3/BUN 54/creatinine 1.09 BNP 1984 Today lactate 1.0, troponins 39> 38  He was seen in consult by cardiology yesterday, started on IV amiodarone , continued on heparin  and Plavix .  Past Medical History:  Diagnosis Date   Abdominal hernia    per pt   Arthritis    hands  Benign localized prostatic hyperplasia with lower urinary tract symptoms (LUTS)    CAD (coronary artery disease) 05/2004   cardiologist--- dr anner;  positive stress test , had outpt  cardiac cath 05-27-2004 stenosis RI;  06-18-2004 cath w/ PCI and BMS x1 to pRI;   STEMI 02-16-2009  s/p cath w/ PCI thrombectomy for total occluded RCA , BMS x1 to pRCA;  nuclear stress test 03-15-2012 low risk no ischemia but sig inferior-inferolateral infarct , ef 51%   Dyslipidemia    Essential hypertension    Heart murmur    History of COVID-19 05/2020   per pt mild symptoms that resolved   History of ST elevation myocardial infarction (STEMI) 02/16/2009   inferior STEMI   cath w/ pci w/ BMS to pRCA   History of urinary retention    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[facility]) 04/2021   urologist--- dr borden/ radiation oncology-- dr patrcia;  dx 12/ 2022,  Gleason 4+5, PSA 24.6, volume 94.6cc;  plan to start IMRT   OSA (obstructive sleep apnea)    per pt dx approx 2008, no cpap intolerant   PONV (postoperative nausea and vomiting)    Pre-diabetes    S/p bare metal coronary artery stent    01/ 2006  x1 BMS to pRI (CoStar study stent);  and 09/ 2010  x1 BMS to pRCA   Status post total knee replacement, 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    Past Surgical History:  Procedure Laterality Date   BOWEL DECOMPRESSION N/A 02/18/2024   Procedure: DECOMPRESSION, INTESTINE;  Surgeon: Albertus Gordy HERO, MD;  Location: [facility] ENDOSCOPY;  Service: Gastroenterology;  Laterality: N/A;   CARDIAC CATHETERIZATION  05/27/2004   outpatient by dr burnard [facility] cardiology) for positive stress test;  80% stensis pRI   CARDIOVERSION N/A 02/21/2024   Procedure: CARDIOVERSION;  Surgeon: Cherrie Toribio SAUNDERS, MD;  Location: [facility] INVASIVE CV LAB;  Service: Cardiovascular;  Laterality: N/A;   COLONOSCOPY N/A 02/18/2024   Procedure: COLONOSCOPY;  Surgeon: Albertus Gordy HERO, MD;  Location: [facility] ENDOSCOPY;  Service: Gastroenterology;  Laterality: N/A;   CORONARY ANGIOPLASTY WITH STENT PLACEMENT  06/18/2004   @[facility]  by Dr Lavon;  PCI w/ BMS to pRI  (CoStar study stent - 2.5 x 16)   CORONARY ANGIOPLASTY WITH STENT PLACEMENT  02/16/2009   @[facility]  by dr verlin;    Inferior STEMI:  PCI w/ thrombectomy total occluded RCA, BMS x1 to pRCA (Vision BMS 3.5 x 23 -> 4.0 mm), residual 40-50% pLAD, ef 45-50%   CORONARY PRESSURE/FFR STUDY N/A 01/17/2024   Procedure: CORONARY PRESSURE/FFR STUDY;  Surgeon: Mady Bruckner, MD;  Location: [facility] INVASIVE CV LAB;  Service: Cardiovascular;  Laterality: N/A;   CORONARY STENT INTERVENTION N/A 01/17/2024   Procedure: CORONARY STENT INTERVENTION;  Surgeon: Mady Bruckner, MD;  Location: [facility] INVASIVE CV LAB;  Service: Cardiovascular;  Laterality: N/A;   CORONARY ULTRASOUND/IVUS N/A 01/17/2024   Procedure: Coronary Ultrasound/IVUS;  Surgeon: Mady Bruckner, MD;  Location: [facility] INVASIVE CV LAB;  Service: Cardiovascular;  Laterality: N/A;   CORONARY/GRAFT ACUTE MI REVASCULARIZATION N/A 02/01/2024   Procedure: Coronary/Graft Acute MI Revascularization;  Surgeon: Elmira Newman PARAS, MD;  Location: [facility] INVASIVE CV LAB;  Service: Cardiovascular;  Laterality: N/A;   ESOPHAGOGASTRODUODENOSCOPY N/A 02/02/2024   Procedure: EGD (ESOPHAGOGASTRODUODENOSCOPY);  Surgeon: Luisa Louk V, MD;  Location: [facility] ENDOSCOPY;  Service: Gastroenterology;  Laterality: N/A;   FLEXIBLE SIGMOIDOSCOPY N/A 10/20/2023   Procedure: KINGSTON SIDE;  Surgeon: Legrand Victory LITTIE DOUGLAS, MD;  Location: [facility] ENDOSCOPY;  Service: Gastroenterology;  Laterality: N/A;  FLEXIBLE SIGMOIDOSCOPY N/A 02/02/2024   Procedure: KINGSTON SIDE;  Surgeon: Shila Gustav GAILS, MD;  Location: [facility] ENDOSCOPY;  Service: Gastroenterology;  Laterality: N/A;   GOLD SEED IMPLANT N/A 09/19/2021   Procedure: GOLD SEED IMPLANT;  Surgeon: Cam Morene ORN, MD;  Location: [facility];  Service: Urology;  Laterality: N/A;  ONLY NEEDS 30 MIN FOR ALL   INGUINAL HERNIA REPAIR Right 09/02/1999   @[facility] ;   recurrent repair right inguinal hernia;   previous repair approx 1990s   INGUINAL HERNIA REPAIR Left    1990s   KNEE ARTHROSCOPY W/ MENISCAL REPAIR Right 12/18/2005   @[facility]     LUMBAR DISC SURGERY  1982   L5--S1   RIGHT/LEFT HEART CATH AND CORONARY ANGIOGRAPHY N/A 01/17/2024   Procedure: RIGHT/LEFT HEART CATH AND CORONARY ANGIOGRAPHY;  Surgeon: Mady Bruckner, MD;  Location: [facility] INVASIVE CV LAB;  Service: Cardiovascular;  Laterality: N/A;   SPACE OAR INSTILLATION N/A 09/19/2021   Procedure: SPACE OAR INSTILLATION;  Surgeon: Cam Morene ORN, MD;  Location: [facility];  Service: Urology;  Laterality: N/A;   TOTAL KNEE ARTHROPLASTY Left 10/01/2023   Procedure: ARTHROPLASTY, KNEE, TOTAL;  Surgeon: Kay Kemps, MD;  Location: [facility] ORS;  Service: Orthopedics;  Laterality: Left;   TRANSESOPHAGEAL ECHOCARDIOGRAM (CATH LAB) N/A 02/21/2024   Procedure: TRANSESOPHAGEAL ECHOCARDIOGRAM;  Surgeon: Cherrie Toribio SAUNDERS, MD;  Location: [facility] INVASIVE CV LAB;  Service: Cardiovascular;  Laterality: N/A;    Prior to Admission medications   Medication Sig Start Date End Date Taking? Authorizing Provider  amiodarone  (PACERONE ) 200 MG tablet Take 1 tablet (200 mg total) by mouth daily. 04/07/24   Milford, Harlene HERO, FNP  apixaban  (ELIQUIS ) 5 MG TABS tablet Take 1 tablet (5 mg total) by mouth 2 (two) times daily. 03/23/24   Colletta Manuelita Garre, PA-C  CALCIUM  PO Take 500 mg by mouth daily.    [provider]  clopidogrel  (PLAVIX ) 75 MG tablet Take 1 tablet (75 mg total) by mouth daily. 03/23/24   Colletta Manuelita Garre, PA-C  cyanocobalamin  (VITAMIN B12) 1000 MCG tablet Take 1,000 mcg by mouth daily.    [provider]  digoxin  (LANOXIN ) 0.125 MG tablet Take 1/2 tablet (0.0625 mg total) by mouth daily. 03/23/24   Colletta Manuelita Garre, PA-C  empagliflozin  (JARDIANCE ) 10 MG TABS tablet Take 1 tablet (10 mg total) by mouth daily before breakfast. 03/10/24   Lee, Jordan, NP  furosemide  (LASIX ) 40 MG tablet Take 1 tablet (40 mg total) by mouth 2 (two) times daily. 04/07/24 07/06/24  Glena Harlene HERO, FNP  Multiple Vitamins-Minerals (MULTIVITAMIN MEN 50+) TABS  Take 1 tablet by mouth daily.    [provider]  nitroGLYCERIN  (NITROSTAT ) 0.4 MG SL tablet Place 0.4 mg under the tongue every 5 (five) minutes as needed for chest pain. 01/19/24   [provider]  ondansetron  (ZOFRAN ) 8 MG tablet Take 8 mg by mouth every 8 (eight) hours as needed for nausea or vomiting. 02/29/24   [provider]  pantoprazole  (PROTONIX ) 40 MG tablet Take 1 tablet (40 mg total) by mouth daily. 02/24/24   Arrien, Mauricio Daniel, MD  potassium chloride  SA (KLOR-CON  M) 20 MEQ tablet Take 1 tablet (20 mEq total) by mouth 2 (two) times daily. 04/07/24   Glena Harlene HERO, FNP  Probiotic Product (PROBIOTIC PO) Take 1,000 mg by mouth daily. Chew    [provider]  rosuvastatin  (CRESTOR ) 20 MG tablet TAKE 1 TABLET(20 MG) BY MOUTH DAILY 11/29/23   Anner Alm ORN,  MD  sacubitril -valsartan  (ENTRESTO ) 24-26 MG Take 1 tablet by mouth 2 (two) times daily. 04/07/24   Glena Harlene HERO, FNP  sertraline (ZOLOFT) 50 MG tablet Take 25-50 mg by mouth See admin instructions. Taking 25 mg for 7 day and 50 mg daily after    [provider]  spironolactone  (ALDACTONE ) 25 MG tablet Take 0.5 tablets (12.5 mg total) by mouth daily. 03/14/24 06/12/24  Glena Harlene HERO, FNP  tamsulosin  (FLOMAX ) 0.4 MG CAPS capsule Take 1 capsule (0.4 mg total) by mouth daily after supper. 11/17/21   Patrcia Cough, MD    Current Facility-Administered Medications  Medication Dose Route Frequency Provider Last Rate Last Admin   acetaminophen  (TYLENOL ) tablet 650 mg  650 mg Oral Q6H PRN Howerter, Justin B, DO   650 mg at 04/25/24 8069   Or   acetaminophen  (TYLENOL ) suppository 650 mg  650 mg Rectal Q6H PRN Howerter, Justin B, DO       amiodarone  (NEXTERONE  PREMIX) 360-4.14 MG/200ML-% (1.8 mg/mL) IV infusion  30 mg/hr Intravenous Continuous Franky Redia SAILOR, MD 16.67 mL/hr at 04/26/24 0700 30 mg/hr at 04/26/24 0700   clopidogrel  (PLAVIX ) tablet 75 mg  75 mg Oral Daily  Howerter, Justin B, DO   75 mg at 04/26/24 0841   fiber supplement (BANATROL TF) liquid 60 mL  60 mL Oral BID Cosette Blackwater, MD   60 mL at 04/26/24 0841   heparin  ADULT infusion 100 units/mL (25000 units/250mL)  1,200 Units/hr Intravenous Continuous Clair Lynwood CROME, RPH 12 mL/hr at 04/26/24 0841 1,200 Units/hr at 04/26/24 9158   insulin  aspart (novoLOG ) injection 0-6 Units  0-6 Units Subcutaneous Q4H Franky Redia SAILOR, MD   1 Units at 04/26/24 9157   ondansetron  (ZOFRAN ) injection 4 mg  4 mg Intravenous Q6H PRN Howerter, Justin B, DO   4 mg at 04/25/24 2208   pantoprazole  (PROTONIX ) injection 40 mg  40 mg Intravenous Q24H Franky Redia SAILOR, MD   40 mg at 04/26/24 9948   rosuvastatin  (CRESTOR ) tablet 20 mg  20 mg Oral Daily Howerter, Justin B, DO   20 mg at 04/26/24 0841    Allergies as of 04/21/2024 - Review Complete 04/21/2024  Allergen Reaction Noted   Codeine Nausea And Vomiting 03/03/2013   Latex Rash 10/01/2023    Family History  Problem Relation Age of Onset   Lung cancer Mother    Colon cancer Neg Hx    Esophageal cancer Neg Hx     Social History   Socioeconomic History   Marital status: Married    Spouse name: [name]   Number of children: 3   Years of education: Not on file   Highest education level: Not on file  Occupational History   Not on file  Tobacco Use   Smoking status: Never   Smokeless tobacco: Never  Vaping Use   Vaping status: Never Used  Substance and Sexual Activity   Alcohol use: No    Alcohol/week: 0.0 standard drinks of alcohol   Drug use: Never   Sexual activity: Not on file  Other Topics Concern   Not on file  Social History Narrative   He is married, father of 3, grandfather of 5. Never smoked. Does not drink.    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    He does lots of lifting, pulling with ropes, using upper extremities.  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   Social Drivers of Corporate Investment Banker Strain: Not on file  Food Insecurity: No Food Insecurity (04/22/2024)   Hunger Vital Sign    Worried About Running Out of Food in the Last Year: Never true    Ran Out of Food in the Last Year: Never true  Transportation Needs: No Transportation Needs (04/22/2024)   PRAPARE - Administrator, Civil Service (Medical): No    Lack of Transportation (Non-Medical): No  Physical Activity: Not on file  Stress: Not on file  Social Connections: Moderately Integrated (04/24/2024)   Social Connection and Isolation Panel    Frequency of Communication with Friends and Family: Three times a week    Frequency of Social Gatherings with Friends and Family: Three times a week    Attends Religious Services: 1 to 4 times per year    Active Member of Clubs or Organizations: No    Attends Banker Meetings: Not on file    Marital Status: Married  Catering Manager Violence: Not At Risk (04/24/2024)   Humiliation, Afraid, Rape, and Kick questionnaire    Fear of Current or Ex-Partner: No    Emotionally Abused: No    Physically Abused: No    Sexually Abused: No    Review of Systems: Pertinent positive and negative review of systems were noted in the above HPI section.  All other review of systems was otherwise negative.   Physical Exam: Vital signs in last 24 hours: Temp:  [97.4 F (36.3 C)-97.9 F (36.6 C)] 97.8 F (36.6 C) (12/03 0809) Pulse Rate:  [51-98] 51 (12/03 0809) Resp:  [17-27] 27 (12/03 0809) BP: (94-129)/(62-89) 110/62 (12/03 0809) SpO2:  [91 %-96 %] 92 % (12/03 0809) Weight:  [89 kg] 89 kg (12/03 0535) Last BM Date : 04/25/24 General:   Alert,  Well-developed, acute and chronically ill-appearing elderly white male pleasant and cooperative in NAD, having some difficulty speaking  in sentences today, somewhat short of breath as well Head:  Normocephalic and atraumatic. Eyes:  Sclera clear, no icterus.   Conjunctiva pink. Ears:  Normal auditory acuity. Nose:  No deformity, discharge,  or lesions. Mouth:  No deformity or lesions.   Neck:  Supple; no masses or thyromegaly. Lungs: Rales bilateral  heart:  irRegular rate and rhythm; no murmurs, clicks, rubs,  or gallops. Abdomen: Large, distended, tympanitic, high-pitched tinkling bowel sounds mild generalized tenderness no rebound.   Rectal: Not done Msk:  Symmetrical without gross deformities. . Pulses:  Normal pulses noted. Extremities:  Without clubbing or edema. Neurologic:  Alert and  oriented x4;  grossly normal neurologically. Skin:  Intact without significant lesions or rashes.. Psych:  Alert and cooperative. Normal mood and affect.  Intake/Output from previous day: 12/02 0701 - 12/03 0700 In: 200 [P.O.:200] Out: 800 [Urine:600; Stool:200] Intake/Output this shift: No intake/output data recorded.  Lab Results: Recent Labs    04/25/24 2339  WBC 8.0  HGB 12.6*  HCT 39.7  PLT 378   BMET Recent Labs    04/25/24 1916 04/25/24 2339 04/26/24 0734  NA 137 136 138  K 3.8 4.3 3.9  CL 108 110 107  CO2 21* 22 22  GLUCOSE 135* 169* 157*  BUN 43* 45* 44*  CREATININE 1.04 1.09 1.10  CALCIUM  9.0 8.4* 8.7*   LFT  No results for input(s): PROT, ALBUMIN, AST, ALT, ALKPHOS, BILITOT, BILIDIR, IBILI in the last 72 hours. PT/INR No results for input(s): LABPROT, INR in the last 72 hours. Hepatitis Panel No results for input(s): HEPBSAG, HCVAB, HEPAIGM, HEPBIGM in the last 72 hours.    IMPRESSION:  #64 78 year old white male with severe schema cardiomyopathy with EF 20 to 25%, coronary artery disease status post MI August 2025 and new LAD stent.  That course was complicated by ischemic colitis, and prolonged ileus.  Also with history of diabetes mellitus, chronic kidney disease,  sleep apnea and prostate cancer. Admitted 4 days ago with presyncope, and found to be hypotensive, this was in the setting of being ill for 4 to 5 days previous to that with nausea vomiting abdominal pain/cramping and diarrhea. He developed recurrent abdominal distention which has persisted.  Imaging with CT abdomen and pelvis on admission shows diffuse colonic dilation and air-fluid levels no wall thickening and some dilated loops of ileum. KUB yesterday with ascending colon measuring 9.3 cm and small bowel loops up to 4.7 cm. KUB today stable  Patient had a difficult course with persistent ileus/Ogilvie's when seen in September 2025 did not respond to Relistor , had some initial response to neostigmine  but then failed and ultimately required colonoscopy for decompression with significant improvement in symptoms.  On exam today he has significant abdominal distention tympany and high-pitched tinkling bowel sounds consistent with recurrent diffuse ileus He has not been vomiting, has continued to have some liquid stool each day  He probably has chronic low flow state to his bowel due to the severe ischemic cardiomyopathy  #2 component of acute congestive heart failure with significantly elevated BNP and worsening changes on chest x-ray-now requiring O2 at 3 L #3 chronic A-fib/flutter-on Eliquis  #4 sleep apnea with CPAP intolerance #5 diabetes mellitus #6.  History of prostate cancer #7 as above status post MI August 2025 new stents, also on Plavix   Plan okay for sips of liquids Will give tapwater enemas today x 2 to help decompress colon  Probably need to retry neostigmine  however concerned with significant increased risk of cardiac complications given A-fib flutter, currently on amiodarone  and with variable pulse down as low as the high 40s.  He is also a high risk candidate for sedation though he did tolerate it during his last admission, we may need to consider repeat decompression within  the next 24 hours. GI will follow with you  Currently on IV heparin  and Plavix       Amy Esterwood PA-C 04/26/2024, 9:53 AM   Attending physician's note  I personally saw the patient and performed a substantive portion of the medical decision making process for this encounter (including a complete performance of the key components : MDM, Hx and Exam), in conjunction with the APP.  I agree with the APP's note, impression, and  the management plan for the number and complexity of problems addressed at the encounter for the patient and take responsibility for that plan with its inherent risk of complications, morbidity, or mortality with additional input as follows.    78 year old male with severe CHF, EF 20 to 25%, atrial flutter on chronic Eliquis , CAD with worsening ileus, colonic pseudoobstruction   On exam abdomen is distended and tympanic, he is passing liquid stool through the rectal tube Patient is unable to stand, he is very deconditioned, is also unable to turn in bed without assistance Maintain potassium ~4 and magnesium  ~ 2 Clear liquids as tolerated Turn in bed  to every 2 hours Out of bed as tolerated in Tapwater enema alternating with Dulcolax suppository daily  Patient was bradycardic earlier, will hold off neostigmine  but if he continues to have worsening distention will consider neostigmine  but will need to be transferred to ICU prior to administration of neostigmine   Consider palliative care consult to discuss goals of care   The patient was provided an opportunity to ask questions and all were answered. The patient agreed with the plan and demonstrated an understanding of the instructions.  LOIS Wilkie Mcgee , MD [phone]

## 2024-04-26 NOTE — TOC Initial Note (Signed)
 Transition of Care [facility]) - Initial/Assessment Note    Patient Details  Name: Chase Combs MRN: [id number] Date of Birth: Apr 01, 1946  Transition of Care [facility]) CM/SW Contact:    Whitfield Campanile, Student-Social Work Phone Number: 04/26/2024, 12:37 PM  Clinical Narrative:                  11:33 AM: MSW Intern presented to patient's bedside, introduced self and role. Patient confirmed address, DOB, and emergency contact. Intern explained PT's recommendation for rehab at SNF, patient agreeable to sending out referral to SNFs. SNF process explained. Patient stated to also reach out to spouse to let her know.  11:34 AM: MSW intern called patient's spouse Chase Combs to explain PT's recommendation for SNF and patient being agreeable to send out SNF referrals. Patient's spouse also agreeable to sending out SNF referrals.  TOC will follow up with bed offers.  Whitfield Campanile, MSW Intern        Patient Goals and CMS Choice            Expected Discharge Plan and Services                                              Prior Living Arrangements/Services                       Activities of Daily Living   ADL Screening (condition at time of admission) Independently performs ADLs?: Yes (appropriate for developmental age) Is the patient deaf or have difficulty hearing?: No Does the patient have difficulty seeing, even when wearing glasses/contacts?: No Does the patient have difficulty concentrating, remembering, or making decisions?: Yes  Permission Sought/Granted                  Emotional Assessment              Admission diagnosis:  Colitis [K52.9] Acute kidney injury [N17.9] Patient Active Problem List   Diagnosis Date Noted   Elevated troponin 04/22/2024   Fall at home, initial encounter 04/22/2024   Hypotension 04/22/2024   Paroxysmal atrial flutter (HCC) 04/22/2024   Anemia of chronic disease 04/22/2024   Enterocolitis 04/21/2024   DNR (do not  resuscitate) 02/19/2024   Generalized abdominal pain 02/18/2024   Abnormal x-ray of abdomen 02/18/2024   Acute kidney injury 02/11/2024   DM2 (diabetes mellitus, type 2) (HCC) 02/11/2024   PUD (peptic ulcer disease) 02/03/2024   Hyperglycemia 02/03/2024   Pressure injury of skin 02/02/2024   Atrial fibrillation with RVR (HCC) 02/01/2024   Anemia due to acute blood loss 02/01/2024   Persistent atrial fibrillation (HCC) 01/31/2024   Small intestinal bacterial overgrowth (SIBO) 01/29/2024   Malnutrition of moderate degree 01/28/2024   Colonic pseudoobstruction 01/28/2024   Chronic systolic CHF (congestive heart failure) (HCC) 01/17/2024   Ischemic cardiomyopathy 01/14/2024   Acute hypoxemic respiratory failure (HCC) 01/08/2024   Hypokalemia 10/16/2023   DOE (dyspnea on exertion) 06/21/2023   Postural dizziness with presyncope 06/13/2022   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(HCC) 06/17/2021   Hyperglycemia, unspecified 02/15/2020   Fatigue due to treatment 09/24/2017   Polypharmacy 06/10/2014   HLD (hyperlipidemia) 03/03/2013   Insomnia 03/03/2013   Essential hypertension    Coronary artery disease involving native coronary artery of native heart without angina pectoris 06/18/2004   PCP:  Wonda Worth SQUIBB, PA Pharmacy:  [id number] [address], [place] - [address] AT [facility] [address] [place] [address] Phone: [phone] Fax: [phone]     Social Drivers of Health (SDOH) Social History: SDOH Screenings   Food Insecurity: No Food Insecurity (04/22/2024)  Housing: Low Risk  (04/22/2024)  Transportation Needs: No Transportation Needs (04/22/2024)  Utilities: Not At Risk (04/22/2024)  Social Connections: Moderately Integrated (04/24/2024)  Tobacco Use: Low Risk  (04/22/2024)   SDOH Interventions:     Readmission Risk Interventions    01/26/2024   12:46 PM 10/21/2023   11:36 AM  Readmission Risk Prevention Plan  Transportation Screening  Complete Complete  PCP or Specialist Appt within 5-7 Days  Complete  Home Care Screening  Complete  Medication Review (RN CM)  Complete  Medication Review (RN Care Manager) Referral to Pharmacy   PCP or Specialist appointment within 3-5 days of discharge Complete   HRI or Home Care Consult Complete   SW Recovery Care/Counseling Consult Complete   Palliative Care Screening Not Applicable   Skilled Nursing Facility Not Applicable

## 2024-04-26 NOTE — NC FL2 (Signed)
 [place]  MEDICAID FL2 LEVEL OF CARE FORM     IDENTIFICATION  Patient Name: Chase Combs Birthdate: 08/22/1945 Sex: male Admission Date (Current Location): 04/21/2024  [facility] [place] Number:  Producer, Television/film/video and Address:  [facility], [address] [place], [place] [address]      Provider Number: [id number]  Attending Physician Name and Address:  Donnamarie Lebron PARAS, MD  Relative Name and Phone Number:  Delaney, Schnick (Spouse)  [phone] (Mobile)    Current Level of Care: Hospital Recommended Level of Care: Skilled Nursing Facility Prior Approval Number:    Date Approved/Denied:   PASRR Number: [id number]  Discharge Plan: SNF    Current Diagnoses: Patient Active Problem List   Diagnosis Date Noted   Elevated troponin 04/22/2024   Fall at home, initial encounter 04/22/2024   Hypotension 04/22/2024   Paroxysmal atrial flutter (HCC) 04/22/2024   Anemia of chronic disease 04/22/2024   Enterocolitis 04/21/2024   DNR (do not resuscitate) 02/19/2024   Generalized abdominal pain 02/18/2024   Abnormal x-ray of abdomen 02/18/2024   Acute kidney injury 02/11/2024   DM2 (diabetes mellitus, type 2) (HCC) 02/11/2024   PUD (peptic ulcer disease) 02/03/2024   Hyperglycemia 02/03/2024   Pressure injury of skin 02/02/2024   Atrial fibrillation with RVR (HCC) 02/01/2024   Anemia due to acute blood loss 02/01/2024   Persistent atrial fibrillation (HCC) 01/31/2024   Small intestinal bacterial overgrowth (SIBO) 01/29/2024   Malnutrition of moderate degree 01/28/2024   Colonic pseudoobstruction 01/28/2024   Chronic systolic CHF (congestive heart failure) (HCC) 01/17/2024   Ischemic cardiomyopathy 01/14/2024   Acute hypoxemic respiratory failure (HCC) 01/08/2024   Hypokalemia 10/16/2023   DOE (dyspnea on exertion) 06/21/2023   Postural dizziness with presyncope 06/13/2022   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(HCC) 06/17/2021   Hyperglycemia,  unspecified 02/15/2020   Fatigue due to treatment 09/24/2017   Polypharmacy 06/10/2014   HLD (hyperlipidemia) 03/03/2013   Insomnia 03/03/2013   Essential hypertension    Coronary artery disease involving native coronary artery of native heart without angina pectoris 06/18/2004    Orientation RESPIRATION BLADDER Height & Weight     Self, Time, Situation, Place  O2 (Nasul Cannula 2L/Min) Continent Weight: 196 lb 3.4 oz (89 kg) Height:  6' (182.9 cm)  BEHAVIORAL SYMPTOMS/MOOD NEUROLOGICAL BOWEL NUTRITION STATUS      Continent Diet (Please see discharge summary)  AMBULATORY STATUS COMMUNICATION OF NEEDS Skin   Limited Assist Verbally Normal                       Personal Care Assistance Level of Assistance  Bathing, Feeding, Dressing Bathing Assistance: Limited assistance Feeding assistance: Limited assistance Dressing Assistance: Limited assistance     Functional Limitations Info  Sight, Hearing, Speech Sight Info: Adequate Hearing Info: Adequate Speech Info: Adequate (Slurred/Dysarthria)    SPECIAL CARE FACTORS FREQUENCY  PT (By licensed PT), OT (By licensed OT)     PT Frequency: 5x a week OT Frequency: 5x a week            Contractures Contractures Info: Not present    Additional Factors Info  Code Status, Allergies, Insulin  Sliding Scale Code Status Info: DNR-Limited Allergies Info: Codeine, Latex   Insulin  Sliding Scale Info: insulin  aspart (novoLOG ) injection 0-6 Units       Current Medications (04/26/2024):  This is the current hospital active medication list Current Facility-Administered Medications  Medication Dose Route Frequency Provider Last Rate Last Admin  acetaminophen  (TYLENOL ) tablet 650 mg  650 mg Oral Q6H PRN Howerter, Justin B, DO   650 mg at 04/25/24 1930   Or   acetaminophen  (TYLENOL ) suppository 650 mg  650 mg Rectal Q6H PRN Howerter, Justin B, DO       amiodarone  (NEXTERONE  PREMIX) 360-4.14 MG/200ML-% (1.8 mg/mL) IV infusion  30  mg/hr Intravenous Continuous Franky Redia SAILOR, MD 16.67 mL/hr at 04/26/24 0700 30 mg/hr at 04/26/24 0700   clopidogrel  (PLAVIX ) tablet 75 mg  75 mg Oral Daily Howerter, Justin B, DO   75 mg at 04/26/24 0841   fiber supplement (BANATROL TF) liquid 60 mL  60 mL Oral BID Tariq, Hassan, MD   60 mL at 04/26/24 0841   heparin  ADULT infusion 100 units/mL (25000 units/250mL)  1,200 Units/hr Intravenous Continuous Clair Lynwood CROME, RPH 12 mL/hr at 04/26/24 0841 1,200 Units/hr at 04/26/24 0841   insulin  aspart (novoLOG ) injection 0-6 Units  0-6 Units Subcutaneous Q4H Franky Redia SAILOR, MD   1 Units at 04/26/24 9157   ondansetron  (ZOFRAN ) injection 4 mg  4 mg Intravenous Q6H PRN Howerter, Justin B, DO   4 mg at 04/25/24 2208   pantoprazole  (PROTONIX ) injection 40 mg  40 mg Intravenous Q24H Franky Redia SAILOR, MD   40 mg at 04/26/24 0051   rosuvastatin  (CRESTOR ) tablet 20 mg  20 mg Oral Daily Howerter, Justin B, DO   20 mg at 04/26/24 9158     Discharge Medications: Please see discharge summary for a list of discharge medications.  Relevant Imaging Results:  Relevant Lab Results:   Additional Information SSN: [id number]  Whitfield Campanile, Student-Social Work

## 2024-04-27 ENCOUNTER — Inpatient Hospital Stay (HOSPITAL_COMMUNITY)

## 2024-04-27 DIAGNOSIS — K5981 Ogilvie syndrome: Secondary | ICD-10-CM | POA: Diagnosis not present

## 2024-04-27 DIAGNOSIS — K529 Noninfective gastroenteritis and colitis, unspecified: Secondary | ICD-10-CM | POA: Diagnosis not present

## 2024-04-27 DIAGNOSIS — K567 Ileus, unspecified: Secondary | ICD-10-CM | POA: Diagnosis not present

## 2024-04-27 LAB — GLUCOSE, CAPILLARY
Glucose-Capillary: 136 mg/dL — ABNORMAL HIGH (ref 70–99)
Glucose-Capillary: 133 mg/dL — ABNORMAL HIGH (ref 70–99)
Glucose-Capillary: 136 mg/dL — ABNORMAL HIGH (ref 70–99)
Glucose-Capillary: 145 mg/dL — ABNORMAL HIGH (ref 70–99)
Glucose-Capillary: 124 mg/dL — ABNORMAL HIGH (ref 70–99)
Glucose-Capillary: 110 mg/dL — ABNORMAL HIGH (ref 70–99)
Glucose-Capillary: 116 mg/dL — ABNORMAL HIGH (ref 70–99)

## 2024-04-27 LAB — BLOOD GAS, ARTERIAL
Acid-base deficit: 2.2 mmol/L — ABNORMAL HIGH (ref 0.0–2.0)
Bicarbonate: 23.2 mmol/L (ref 20.0–28.0)
Drawn by: 24610
O2 Saturation: 98.1 %
Patient temperature: 36.4
pCO2 arterial: 40 mmHg (ref 32–48)
pH, Arterial: 7.37 (ref 7.35–7.45)
pO2, Arterial: 75 mmHg — ABNORMAL LOW (ref 83–108)

## 2024-04-27 LAB — BASIC METABOLIC PANEL WITH GFR
Anion gap: 7 (ref 5–15)
BUN: 41 mg/dL — ABNORMAL HIGH (ref 8–23)
CO2: 24 mmol/L (ref 22–32)
Calcium: 8.3 mg/dL — ABNORMAL LOW (ref 8.9–10.3)
Chloride: 107 mmol/L (ref 98–111)
Creatinine, Ser: 1.03 mg/dL (ref 0.61–1.24)
GFR, Estimated: >60 mL/min (ref >60)
Glucose, Bld: 134 mg/dL — ABNORMAL HIGH (ref 70–99)
Potassium: 4.2 mmol/L (ref 3.5–5.1)
Sodium: 138 mmol/L (ref 135–145)

## 2024-04-27 LAB — CBC WITH DIFFERENTIAL/PLATELET
Abs Immature Granulocytes: 0.07 K/uL (ref 0.00–0.07)
Basophils Absolute: 0 K/uL (ref 0.0–0.1)
Basophils Relative: 1 %
Eosinophils Absolute: 0.1 K/uL (ref 0.0–0.5)
Eosinophils Relative: 1 %
HCT: 37.1 % — ABNORMAL LOW (ref 39.0–52.0)
Hemoglobin: 11.9 g/dL — ABNORMAL LOW (ref 13.0–17.0)
Immature Granulocytes: 1 %
Lymphocytes Relative: 12 %
Lymphs Abs: 0.9 K/uL (ref 0.7–4.0)
MCH: 29.8 pg (ref 26.0–34.0)
MCHC: 32.1 g/dL (ref 30.0–36.0)
MCV: 92.8 fL (ref 80.0–100.0)
Monocytes Absolute: 1.1 K/uL — ABNORMAL HIGH (ref 0.1–1.0)
Monocytes Relative: 14 %
Neutro Abs: 5.7 K/uL (ref 1.7–7.7)
Neutrophils Relative %: 71 %
Platelets: 365 K/uL (ref 150–400)
RBC: 4 MIL/uL — ABNORMAL LOW (ref 4.22–5.81)
RDW: 19.5 % — ABNORMAL HIGH (ref 11.5–15.5)
Smear Review: NORMAL
WBC: 7.9 K/uL (ref 4.0–10.5)
nRBC: 0 % (ref 0.0–0.2)

## 2024-04-27 LAB — HEPARIN LEVEL (UNFRACTIONATED): Heparin Unfractionated: >1.1 [IU]/mL — ABNORMAL HIGH (ref 0.30–0.70)

## 2024-04-27 LAB — PROCALCITONIN: Procalcitonin: 0.26 ng/mL

## 2024-04-27 LAB — APTT: aPTT: 88 s — ABNORMAL HIGH (ref 24–36)

## 2024-04-27 LAB — D-DIMER, QUANTITATIVE: D-Dimer, Quant: 1.43 ug{FEU}/mL — ABNORMAL HIGH (ref 0.00–0.50)

## 2024-04-27 MED ORDER — BOOST / RESOURCE BREEZE PO LIQD CUSTOM
1.0000 | Freq: Three times a day (TID) | ORAL | Status: DC
  Administered 2024-04-27 – 2024-05-11 (×28): 1 via ORAL
  Filled 2024-04-27 (×3): qty 1

## 2024-04-27 MED ORDER — POLYETHYLENE GLYCOL 3350 17 G PO PACK
17.0000 g | PACK | Freq: Three times a day (TID) | ORAL | Status: DC
  Administered 2024-04-27 – 2024-04-29 (×6): 17 g via ORAL
  Filled 2024-04-27 (×7): qty 1

## 2024-04-27 MED ORDER — CHLORHEXIDINE GLUCONATE CLOTH 2 % EX PADS
6.0000 | MEDICATED_PAD | Freq: Every day | CUTANEOUS | Status: DC
  Administered 2024-04-29 – 2024-05-11 (×13): 6 via TOPICAL

## 2024-04-27 MED ORDER — LIDOCAINE 4 % EX CREA
TOPICAL_CREAM | Freq: Once | CUTANEOUS | Status: DC
  Filled 2024-04-27: qty 5

## 2024-04-27 NOTE — Progress Notes (Signed)
 CT called RN to ask if pt could be disconnected from one of his drips while he is getting the CT done since he has 2 drips and 2 IVs. Contrast needs to be administered. RN spoke with MD, Donnamarie, and RPH from main pharmacy. Both agreed the heparin  drip could be paused while pt is in CT. Continue with amiodarone  drip. Will pass on to night shift nurse.

## 2024-04-27 NOTE — Progress Notes (Addendum)
 Patient ID: Chase Combs, male   DOB: April 24, 1946, 78 y.o.   MRN: [id number]    Progress Note   Subjective  Day # 5 CC; recurrent diffuse ileus  IV heparin  IV amiodarone  Plavix   Labs today WBC 7.9/hemoglobin 11.9/hematocrit 37.1 Potassium 4.2/BUN 41/creatinine 1.03 D-dimer 1.43 Chest x-ray this afternoon, report pending CT angio chest ordered today PE protocol  Patient says he does feel more short of breath today, denies any chest pain, says he feels very worn out he is aware that he had a bowel movement earlier this afternoon, says his abdomen is not painful today    Objective   Vital signs in last 24 hours: Temp:  [97.5 F (36.4 C)-97.8 F (36.6 C)] 97.5 F (36.4 C) (12/04 1644) Pulse Rate:  [51-100] 85 (12/04 1644) Resp:  [19-20] 20 (12/04 1644) BP: (95-154)/(62-134) 100/85 (12/04 1644) SpO2:  [96 %-100 %] 98 % (12/04 1644) Weight:  [90.3 kg] 90.3 kg (12/04 0623) Last BM Date : 04/26/24 General:    Elderly white male acute and chronically ill-appearing, fatigued appearing, increased work of breathing today ,Sleepy-wife at bedside Heart:  irRegular rate and rhythm; no murmurs Lungs: Respirations even, using accessory muscles no wheezing Abdomen: Large, distended, bowel sounds are present, a bit softer than yesterday and no focal tenderness-Flexi-Seal now in place with small amount of liquid brown stool-he did have a large bowel movement in the bed earlier this afternoon Extremities:  Without edema. Neurologic:  Alert and oriented,  grossly normal neurologically. Psych:  Cooperative. Normal mood and affect.  Intake/Output from previous day: 12/03 0701 - 12/04 0700 In: 0  Out: 1100 [Urine:600; Stool:500] Intake/Output this shift: Total I/O In: 360 [P.O.:360] Out: 1150 [Urine:1150]  Lab Results: Recent Labs    04/25/24 2339 04/27/24 0353  WBC 8.0 7.9  HGB 12.6* 11.9*  HCT 39.7 37.1*  PLT 378 365   BMET Recent Labs    04/25/24 2339 04/26/24 0734  04/27/24 0353  NA 136 138 138  K 4.3 3.9 4.2  CL 110 107 107  CO2 22 22 24   GLUCOSE 169* 157* 134*  BUN 45* 44* 41*  CREATININE 1.09 1.10 1.03  CALCIUM  8.4* 8.7* 8.3*   LFT No results for input(s): PROT, ALBUMIN, AST, ALT, ALKPHOS, BILITOT, BILIDIR, IBILI in the last 72 hours. PT/INR No results for input(s): LABPROT, INR in the last 72 hours.  Studies/Results: DG Abd 1 View Result Date: 04/26/2024 CLINICAL DATA:  Ileus. EXAM: ABDOMEN - 1 VIEW COMPARISON:  04/25/2024 FINDINGS: Diffuse ileus involving colon and small bowel appears stable. It is difficult to accurately measure the cecum on the current study. No signs of free air. IMPRESSION: Stable diffuse ileus involving colon and small bowel. Electronically Signed   By: Marcey Moan M.D.   On: 04/26/2024 09:14       Assessment / Plan:    #33 78 year old white male with severe ischemic cardiomyopathy with EF 20 to 25%, coronary artery disease status post MI August 2025 with new LAD stent.  Very complicated long course with ischemic colitis and prolonged ileus.  Also has chronic kidney disease, diabetes mellitus, sleep apnea and history of prostate cancer.  Admitted 5 days ago with presyncope found to be hypotensive after being ill at home for 4 to 5 days with nausea, vomiting, abdominal cramping and diarrhea, then recurrent abdominal distention which has persisted since then  Imaging with CT abdomen pelvis on admit with diffuse colonic dilation with air-fluid levels and some dilated loops of  ileum. Films on 12 2 with ascending colon at 9.3 cm small bowel loops up to 4.7 cm.  Suspect that he may have a chronic low flow state at this time due to his severe ischemic cardiomyopathy that contributes to his recurrent ileus  He did eventually require colonic decompression during his last admission after he failed Relistor  and neostigmine .  He is a very high risk candidate for complications with sedation currently and  we are trying to avoid need for repeat colonic decompression as do not feel that he will tolerate.  Also very hesitant to use neostigmine  given his current cardiac status and arrhythmia  Fortunately he has had some bowel movements with enemas, now has rectal tube in place did have a bowel movement earlier this afternoon large amount of liquid stool Abdomen and a bit softer today  #2 acute on chronic CHF, now requiring oxygen, increased work of breathing today. Chest x-ray report pending- CT angio chest ordered  Plan; full liquid diet as tolerates MiraLAX  17 g in 8 ounces of water  3 times daily Okay to leave the rectal tube in, if he stops passing stool he may benefit from further enemas-Will reassess tomorrow Did not have any response to Relistor  during last admission Palliative care consult has been requested, pending      Principal Problem:   Enterocolitis Active Problems:   HLD (hyperlipidemia)   Postural dizziness with presyncope   Hypokalemia   Chronic systolic CHF (congestive heart failure) (HCC)   Ogilvie syndrome   Ileus, unspecified (HCC)   Acute kidney injury   DM2 (diabetes mellitus, type 2) (HCC)   Elevated troponin   Fall at home, initial encounter   Hypotension   Paroxysmal atrial flutter (HCC)   Anemia of chronic disease     LOS: 5 days   Amy Esterwood PA-C 04/27/2024, 5:52 PM    Attending physician's note   I personally saw the patient and performed a substantive portion of the medical decision making process for this encounter (including a complete performance of the key components : MDM, Hx and Exam), in conjunction with the APP.  I agree with the APP's note, impression, and  the management plan for the number and complexity of problems addressed at the encounter for the patient and take responsibility for that plan with its inherent risk of complications, morbidity, or mortality with additional input as follows.      He has liquid stool output in the  rectal tube Abdominal distention is slightly better  Continue bowel regimen, MiraLAX  1 capful 3 times daily Enema and Dulcolax suppository daily as needed May consider Relistor , patient did not have significant response in the past  Overall he is deconditioned, not even able to turn in bed independently.  Extremely high risk for anesthesia and procedure related complications, unlikely to benefit from repeat colonoscopy with decompression, will only be a temporary measure if anything  Palliative care consult pending to discuss goals of care . I have spent >35 minutes of patient care (this includes precharting, chart review, review of results, face-to-face time used for counseling as well as treatment plan and follow-up.    LOIS Wilkie Mcgee , MD [phone]

## 2024-04-27 NOTE — Progress Notes (Signed)
 PROGRESS NOTE  Chase Combs FMW:[id number] DOB: [birth year] DOA: 04/21/2024 PCP: Wonda Worth SQUIBB, PA  HPI/Recap of past 24 hours: 78 y.o. 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 Of note, patient had recent complicated admission for septic shock, cardiogenic shock, intestinal pneumatosis/ileus/pseudoobstruction/Ogilvie syndrome. Now admitted for recurrent Ogilvie syndrome, suspected gastroenterocolitis, AKI on CKD, hypotension and acute debilitation.       Today, pt abdomen slightly softer today, continues to complain of SOB, denies any chest pain, N/V, fever/chills. Noted to have a large liquid BM. Rectal tube with small liquid stool.  Appears lethargic, but alert and answered questions appropriately   Assessment/Plan: Principal Problem:   Enterocolitis Active Problems:   Ogilvie syndrome   Chronic systolic CHF (congestive heart failure) (HCC)   Hypokalemia   HLD (hyperlipidemia)   Acute kidney injury   DM2 (diabetes mellitus, type 2) (HCC)   Postural dizziness with presyncope   Ileus, unspecified (HCC)   Elevated troponin   Fall at home, initial encounter   Hypotension   Paroxysmal atrial flutter (HCC)   Anemia of chronic disease   Diffuse progressive ileus Recurrent Ogilvie Syndrome History of recent hospitalization (01/22/24-02/25/24) for similar GI symptoms with extensive workup. Underwent colonic decompression (02/18/24) with improvement Currently afebrile, with no  leukocytosis C diff and enteric PCR panel negative CT A/P w/o contrast with noted diffuse mild dilatation of colon, distended ileal loops and stomach Repeat abdominal x-ray on 12/3 showed diffuse ileus involving colon and small bowel GI consulted, appreciate recs, continue MiraLAX , rectal tube, high risk for complications with sedation for colonic decompression Continue clear liquids Aspiration precautions Monitor closely  Acute hypoxic respiratory failure Currently requiring about 3 L of O2 ABG showed pH 7.37, pO2 75, CO2 40 D-dimer mildly elevated Procalcitonin 0.26 Likely in the setting of abdominal distension from underlying ileus/pseudoobstruction/gastroenterocolitis in addition to advanced CHF (required IV fluid resuscitation due to AKI) Repeat chest x-ray pending CTA chest pending to rule out pneumonia versus PE Discontinued IV fluids for now Supplemental O2 as needed  CHFrEF Ischemic cardiomyopathy Recent prolonged hospitalization complicated by cardiogenic shock with acute CHF exacerbation in the setting of LAD stent stenosis  BNP 1984 Last echo 01/2024 with LVEF 20-25% CXR 12/2 showed bilateral opacities Cardiology consulted, appreciate recs Hold home lasix , spironolactone , entresto , digoxin , Jardiance  Monitor I/Os, daily weights, renal function  Paroxysmal Afib/flutter with RVR Heart rate improved S/p TEE CV on 02/21/2024 Cardiology on board, appreciate recs Continue IV amiodarone , IV heparin  (Eliquis  held) Telemetry  CAD s/p DES to LAD 8/25 Hyperlipidemia Recent balloon angioplasty of LAD stent thrombosis (02/01/2024) due to inability to tolerate oral DAPT  History of remote PCI to RI and RCA EKG with known RBBB, LAFB, no significant changes from prior Troponin 67 < 71 Cardiology consulted, appreciate recs Cont home plavix , currently on IV heparin , held Eliquis , continue Crestor  Telemetry  Hypotension Improving Likely in the setting of dehydration/orthostatic  hypotension from possible ileus/pseudoobstruction/gastroenterocolitis CT head unremarkable S/p IV fluids Clear liquid diet Continue to hold BP meds  Hypokalemia/hypomagnesemia Likely in the setting of ileus, frequent diarrhea Monitor  and replete electrolytes as needed   AKI on CKD Resolved Cr 1.11 < 2.1, baseline 0.9-1.5 Likely in the setting of dehydration from diarrhea CT A/P with no concern for obstructive disease Holding home entresto , spironolactone , lasix  Cont external foley for strict I/Os Avoid nephrotoxic agents Renal dose medications Monitor BMP   Peptic ulcer disease Recent hospitalization with EGD (02/02/24) showing esophageal ulcer without bleeding, grade C reflux esophagitis without bleeding, duodenal erosions Cont pantoprazole    T2DM Hold home Jardiance  Cont SSI   Incidental R thyroid  nodule Noted on CT C spine TSH normal Will need outpatient thyroid  US    Chronic normocytic anemia Hgb 11.2, baseline 10-12 Daily CBC   Acute debilitation Mechanical fall Monitor on fall precautions PT/OT following- rec SNF  GOC discussion Patient with poor prognosis, recurrent severe ileus, advanced CHF Palliative care consulted Currently DNR   Malnutrition Type:  Nutrition Problem: Moderate Malnutrition Etiology: chronic illness   Malnutrition Characteristics:  Signs/Symptoms: moderate fat depletion, severe muscle depletion, moderate muscle depletion   Nutrition Interventions:  Interventions: Refer to RD note for recommendations    Estimated body mass index is 27 kg/m as calculated from the following:   Height as of this encounter: 6' (1.829 m).   Weight as of this encounter: 90.3 kg.     Code Status: DNR  Family Communication: None at bedside  Disposition Plan: Status is: Inpatient Remains inpatient appropriate because: Level of care      Consultants: GI Cardiology  Procedures: None  Antimicrobials: None  DVT prophylaxis: IV  heparin    Objective: Vitals:   04/27/24 0623 04/27/24 0841 04/27/24 1236 04/27/24 1644  BP:  95/62 107/66 100/85  Pulse: 70 98 100 85  Resp: 19 20 20 20   Temp:  97.6 F (36.4 C) (!) 97.5 F (36.4 C) (!) 97.5 F (36.4 C)  TempSrc:  Oral Oral Oral  SpO2: 97% 96% 98% 98%  Weight: 90.3 kg     Height:        Intake/Output Summary (Last 24 hours) at 04/27/2024 1807 Last data filed at 04/27/2024 1806 Gross per 24 hour  Intake 1541.26 ml  Output 1150 ml  Net 391.26 ml   Filed Weights   04/25/24 0445 04/26/24 0535 04/27/24 0623  Weight: 87.9 kg 89 kg 90.3 kg    Exam: General: NAD, awake/alert, lethargic Cardiovascular: S1, S2 present Respiratory: CTAB Abdomen: Firm, nontender, ++distended, bowel sounds present Musculoskeletal: No bilateral pedal edema noted Skin: Normal Psychiatry: fair mood     Data Reviewed: CBC: Recent Labs  Lab 04/21/24 1951 04/22/24 0331 04/23/24 0223 04/25/24 2339 04/27/24 0353  WBC 3.3* 4.7 5.0 8.0 7.9  NEUTROABS  --  2.9 2.9 5.6 5.7  HGB 12.7* 11.1* 12.4* 12.6* 11.9*  HCT 38.4* 33.3* 38.2* 39.7 37.1*  MCV 91.6 90.2 91.6 92.8 92.8  PLT 364 309 307 378 365   Basic Metabolic Panel: Recent Labs  Lab 04/22/24 1351 04/23/24 0223 04/23/24 1335 04/24/24 0653 04/24/24 1756 04/25/24 0535 04/25/24 0745 04/25/24 1916 04/25/24 2339 04/26/24 0734 04/27/24 0353  NA 135 132*   < > 134*   < >  --  136 137 136 138 138  K 3.4* 3.0*   < > 2.9*   < >  --  3.8 3.8 4.3 3.9 4.2  CL 101 100   < > 103   < >  --  112* 108 110 107 107  CO2 19* 18*   < > 20*   < >  --  16* 21*  22 22 24   GLUCOSE 104* 109*   < > 88   < >  --  159* 135* 169* 157* 134*  BUN 59* 68*   < > 70*   < >  --  49* 43* 45* 44* 41*  CREATININE 2.05* 1.85*   < > 1.53*   < >  --  1.11 1.04 1.09 1.10 1.03  CALCIUM  8.5* 8.5*   < > 8.2*   < >  --  8.7* 9.0 8.4* 8.7* 8.3*  MG 2.0 2.0  --  2.1  --  2.2  --   --  2.3  --   --    < > = values in this interval not displayed.    GFR: Estimated Creatinine Clearance: 67 mL/min (by C-G formula based on SCr of 1.03 mg/dL). Liver Function Tests: Recent Labs  Lab 04/21/24 1951 04/22/24 0331 04/23/24 0223  AST 22 27 52*  ALT 20 25 66*  ALKPHOS 86 54 60  BILITOT 0.8 0.7 0.5  PROT 7.3 5.6* 5.7*  ALBUMIN 4.1 2.7* 2.6*   No results for input(s): LIPASE, AMYLASE in the last 168 hours. No results for input(s): AMMONIA in the last 168 hours. Coagulation Profile: Recent Labs  Lab 04/21/24 1951  INR 1.7*   Cardiac Enzymes: No results for input(s): CKTOTAL, CKMB, CKMBINDEX, TROPONINI in the last 168 hours. BNP (last 3 results) Recent Labs    01/07/24 2301  PROBNP 15,245.0*   HbA1C: No results for input(s): HGBA1C in the last 72 hours. CBG: Recent Labs  Lab 04/27/24 0132 04/27/24 0330 04/27/24 0837 04/27/24 1233 04/27/24 1640  GLUCAP 136* 133* 136* 145* 124*   Lipid Profile: No results for input(s): CHOL, HDL, LDLCALC, TRIG, CHOLHDL, LDLDIRECT in the last 72 hours. Thyroid  Function Tests: No results for input(s): TSH, T4TOTAL, FREET4, T3FREE, THYROIDAB in the last 72 hours. Anemia Panel: No results for input(s): VITAMINB12, FOLATE, FERRITIN, TIBC, IRON, RETICCTPCT in the last 72 hours. Urine analysis:    Component Value Date/Time   COLORURINE YELLOW 04/22/2024 1205   APPEARANCEUR CLEAR 04/22/2024 1205   LABSPEC >1.030 (H) 04/22/2024 1205   PHURINE 5.5 04/22/2024 1205   GLUCOSEU NEGATIVE 04/22/2024 1205   HGBUR NEGATIVE 04/22/2024 1205   BILIRUBINUR NEGATIVE 04/22/2024 1205   KETONESUR NEGATIVE 04/22/2024 1205   PROTEINUR NEGATIVE 04/22/2024 1205   UROBILINOGEN 0.2 02/17/2009 0645   NITRITE NEGATIVE 04/22/2024 1205   LEUKOCYTESUR NEGATIVE 04/22/2024 1205   Sepsis Labs: @LABRCNTIP (procalcitonin:4,lacticidven:4)  ) Recent Results (from the past 240 hours)  Resp panel by RT-PCR (RSV, Flu A&B, Covid) Anterior Nasal Swab     Status: None    Collection Time: 04/21/24  7:51 PM   Specimen: Anterior Nasal Swab  Result Value Ref Range Status   SARS Coronavirus 2 by RT PCR NEGATIVE NEGATIVE Final    Comment: (NOTE) SARS-CoV-2 target nucleic acids are NOT DETECTED.  The SARS-CoV-2 RNA is generally detectable in upper respiratory specimens during the acute phase of infection. The lowest concentration of SARS-CoV-2 viral copies this assay can detect is 138 copies/mL. A negative result does not preclude SARS-Cov-2 infection and should not be used as the sole basis for treatment or other patient management decisions. A negative result may occur with  improper specimen collection/handling, submission of specimen other than nasopharyngeal swab, presence of viral mutation(s) within the areas targeted by this assay, and inadequate number of viral copies(<138 copies/mL). A negative result must be combined with clinical observations, patient history, and epidemiological information. The expected  result is Negative.  Fact Sheet for Patients:  bloggercourse.com  Fact Sheet for Healthcare Providers:  seriousbroker.it  This test is no t yet approved or cleared by the [place]  FDA and  has been authorized for detection and/or diagnosis of SARS-CoV-2 by FDA under an Emergency Use Authorization (EUA). This EUA will remain  in effect (meaning this test can be used) for the duration of the COVID-19 declaration under Section 564(b)(1) of the Act, 21 U.S.C.section 360bbb-3(b)(1), unless the authorization is terminated  or revoked sooner.       Influenza A by PCR NEGATIVE NEGATIVE Final   Influenza B by PCR NEGATIVE NEGATIVE Final    Comment: (NOTE) The Xpert Xpress SARS-CoV-2/FLU/RSV plus assay is intended as an aid in the diagnosis of influenza from Nasopharyngeal swab specimens and should not be used as a sole basis for treatment. Nasal washings and aspirates are unacceptable for  Xpert Xpress SARS-CoV-2/FLU/RSV testing.  Fact Sheet for Patients: bloggercourse.com  Fact Sheet for Healthcare Providers: seriousbroker.it  This test is not yet approved or cleared by the [place]  FDA and has been authorized for detection and/or diagnosis of SARS-CoV-2 by FDA under an Emergency Use Authorization (EUA). This EUA will remain in effect (meaning this test can be used) for the duration of the COVID-19 declaration under Section 564(b)(1) of the Act, 21 U.S.C. section 360bbb-3(b)(1), unless the authorization is terminated or revoked.     Resp Syncytial Virus by PCR NEGATIVE NEGATIVE Final    Comment: (NOTE) Fact Sheet for Patients: bloggercourse.com  Fact Sheet for Healthcare Providers: seriousbroker.it  This test is not yet approved or cleared by the [place]  FDA and has been authorized for detection and/or diagnosis of SARS-CoV-2 by FDA under an Emergency Use Authorization (EUA). This EUA will remain in effect (meaning this test can be used) for the duration of the COVID-19 declaration under Section 564(b)(1) of the Act, 21 U.S.C. section 360bbb-3(b)(1), unless the authorization is terminated or revoked.  Performed at [facility], [address], [place], [place] [address]   C Difficile Quick Screen w PCR reflex     Status: None   Collection Time: 04/22/24  6:33 PM   Specimen: STOOL  Result Value Ref Range Status   C Diff antigen NEGATIVE NEGATIVE Final   C Diff toxin NEGATIVE NEGATIVE Final   C Diff interpretation No C. difficile detected.  Final    Comment: Performed at [facility], [address] [place], [place] [address]  Gastrointestinal Panel by PCR , Stool     Status: None   Collection Time: 04/22/24  6:33 PM   Specimen: Stool  Result Value Ref Range Status   Campylobacter species NOT DETECTED NOT DETECTED Final    Plesimonas shigelloides NOT DETECTED NOT DETECTED Final   Salmonella species NOT DETECTED NOT DETECTED Final   Yersinia enterocolitica NOT DETECTED NOT DETECTED Final   Vibrio species NOT DETECTED NOT DETECTED Final   Vibrio cholerae NOT DETECTED NOT DETECTED Final   Enteroaggregative E coli (EAEC) NOT DETECTED NOT DETECTED Final   Enteropathogenic E coli (EPEC) NOT DETECTED NOT DETECTED Final   Enterotoxigenic E coli (ETEC) NOT DETECTED NOT DETECTED Final   Shiga like toxin producing E coli (STEC) NOT DETECTED NOT DETECTED Final   Shigella/Enteroinvasive E coli (EIEC) NOT DETECTED NOT DETECTED Final   Cryptosporidium NOT DETECTED NOT DETECTED Final   Cyclospora cayetanensis NOT DETECTED NOT DETECTED Final   Entamoeba histolytica NOT DETECTED NOT DETECTED Final  Giardia lamblia NOT DETECTED NOT DETECTED Final   Adenovirus F40/41 NOT DETECTED NOT DETECTED Final   Astrovirus NOT DETECTED NOT DETECTED Final   Norovirus GI/GII NOT DETECTED NOT DETECTED Final   Rotavirus A NOT DETECTED NOT DETECTED Final   Sapovirus (I, II, IV, and V) NOT DETECTED NOT DETECTED Final    Comment: Performed at [facility], [address], [place], [place] [address]      Studies: No results found.   Scheduled Meds:  clopidogrel   75 mg Oral Daily   feeding supplement  1 Container Oral TID BM   fiber supplement (BANATROL TF)  60 mL Oral BID   insulin  aspart  0-6 Units Subcutaneous Q4H   pantoprazole  (PROTONIX ) IV  40 mg Intravenous Q24H   polyethylene glycol  17 g Oral TID BM   rosuvastatin   20 mg Oral Daily    Continuous Infusions:  amiodarone  30 mg/hr (04/27/24 1430)   heparin  1,300 Units/hr (04/27/24 0437)     LOS: 5 days     Chase JINNY Cage, MD [facility]  If 7PM-7AM, please contact night-coverage [email or web address] 04/27/2024, 6:07 PM

## 2024-04-27 NOTE — Evaluation (Signed)
 Occupational Therapy Evaluation Patient Details Name: Chase Combs MRN: [id number] DOB: [birth year] Today's Date: 04/27/2024   History of Present Illness   Pt is a 78 y.o. male admitted 11/28 with enterocolitis, presyncope, and hypotension. Multiple recent hospitalizations for cardiac and GI issues.PMH: Ogilvie syndrome s/p colonic decompression, NSTEMI, CAD, heart failure, a fib, HTN, HLD, DMII, CKD, OSA, prostate ca     Clinical Impressions Pt reports ind at baseline with ADLs/functional mobility, lives with spouse. Pt currently needs min-total A for ADLs, min-mod A for bed mobility and mod A +2 for x2 stands from EOB. SpO2 in 90s throughout on 2L O2, however pt with RR in 40s at times, cues for PLB. Pt presenting with impairments listed below, will follow acutely. Patient will benefit from continued inpatient follow up therapy, <3 hours/day to maximize safety/ind with ADL/functional mobility.      If plan is discharge home, recommend the following:   Two people to help with walking and/or transfers;A lot of help with bathing/dressing/bathroom;Assistance with cooking/housework;Direct supervision/assist for medications management;Direct supervision/assist for financial management;Help with stairs or ramp for entrance;Assist for transportation     Functional Status Assessment   Patient has had a recent decline in their functional status and demonstrates the ability to make significant improvements in function in a reasonable and predictable amount of time.     Equipment Recommendations   Other (comment) (defer)     Recommendations for Other Services   PT consult     Precautions/Restrictions   Precautions Precautions: Fall;Other (comment) Recall of Precautions/Restrictions: Intact Precaution/Restrictions Comments: watch BP Restrictions Weight Bearing Restrictions Per Provider Order: No     Mobility Bed Mobility Overal bed mobility: Needs Assistance Bed Mobility:  Rolling, Sidelying to Sit, Sit to Sidelying Rolling: Min assist Sidelying to sit: +2 for safety/equipment, Mod assist, Used rails, HOB elevated Supine to sit: Mod assist Sit to supine: Min assist        Transfers Overall transfer level: Needs assistance Equipment used: Rolling walker (2 wheels) Transfers: Sit to/from Stand Sit to Stand: Mod assist, +2 physical assistance                  Balance Overall balance assessment: Needs assistance Sitting-balance support: Feet supported Sitting balance-Leahy Scale: Fair Sitting balance - Comments: EOB   Standing balance support: Bilateral upper extremity supported Standing balance-Leahy Scale: Zero                             ADL either performed or assessed with clinical judgement   ADL Overall ADL's : Needs assistance/impaired Eating/Feeding: Set up   Grooming: Minimal assistance   Upper Body Bathing: Moderate assistance   Lower Body Bathing: Maximal assistance   Upper Body Dressing : Moderate assistance   Lower Body Dressing: Maximal assistance   Toilet Transfer: Moderate assistance;+2 for physical assistance   Toileting- Clothing Manipulation and Hygiene: Total assistance       Functional mobility during ADLs: Moderate assistance;+2 for physical assistance;Rolling walker (2 wheels)       Vision   Vision Assessment?: No apparent visual deficits     Perception Perception: Not tested       Praxis Praxis: Not tested       Pertinent Vitals/Pain Pain Assessment Pain Assessment: Faces Pain Score: 6  Faces Pain Scale: Hurts even more Pain Location: generalized with movement Pain Descriptors / Indicators: Discomfort Pain Intervention(s): Limited activity within patient's tolerance, Monitored during session, Repositioned  Extremity/Trunk Assessment Upper Extremity Assessment Upper Extremity Assessment: Generalized weakness   Lower Extremity Assessment Lower Extremity Assessment:  Defer to PT evaluation   Cervical / Trunk Assessment Cervical / Trunk Assessment: Normal   Communication Communication Communication: No apparent difficulties   Cognition Arousal: Alert Behavior During Therapy: WFL for tasks assessed/performed, Anxious               OT - Cognition Comments: not formally assessed, follows commands with incr time                 Following commands: Impaired Following commands impaired: Follows one step commands with increased time     Cueing  General Comments   Cueing Techniques: Verbal cues  RR to 40s on 2L O2 with SpO2 in 90s   Exercises     Shoulder Instructions      Home Living Family/patient expects to be discharged to:: Private residence Living Arrangements: Spouse/significant other Available Help at Discharge: Family;Available 24 hours/day Type of Home: House Home Access: Ramped entrance     Home Layout: One level     Bathroom Shower/Tub: Chief Strategy Officer: Handicapped height     Home Equipment: Agricultural Consultant (2 wheels);Rollator (4 wheels);Cane - single point;Lift chair;Grab bars - toilet;Tub bench;Wheelchair - It Trainer          Prior Functioning/Environment Prior Level of Function : Driving;Independent/Modified Independent             Mobility Comments: no AD at baseline ADLs Comments: ind PTA    OT Problem List: Decreased strength;Decreased range of motion;Decreased activity tolerance;Impaired balance (sitting and/or standing);Decreased cognition;Decreased safety awareness   OT Treatment/Interventions: Self-care/ADL training;Therapeutic exercise;Energy conservation;DME and/or AE instruction;Therapeutic activities;Patient/family education;Balance training      OT Goals(Current goals can be found in the care plan section)   Acute Rehab OT Goals Patient Stated Goal: none stated OT Goal Formulation: With patient Time For Goal Achievement: 05/11/24 Potential to  Achieve Goals: Good ADL Goals Pt Will Perform Upper Body Dressing: with supervision;sitting Pt Will Perform Lower Body Dressing: with supervision;sit to/from stand;sitting/lateral leans Pt Will Transfer to Toilet: with supervision;ambulating;regular height toilet Pt Will Perform Tub/Shower Transfer: Tub transfer;Shower transfer;with supervision;ambulating;rolling walker   OT Frequency:  Min 2X/week    Co-evaluation              AM-PAC OT 6 Clicks Daily Activity     Outcome Measure Help from another person eating meals?: A Little Help from another person taking care of personal grooming?: A Little Help from another person toileting, which includes using toliet, bedpan, or urinal?: A Lot Help from another person bathing (including washing, rinsing, drying)?: A Lot Help from another person to put on and taking off regular upper body clothing?: A Little Help from another person to put on and taking off regular lower body clothing?: A Lot 6 Click Score: 15   End of Session Equipment Utilized During Treatment: Rolling walker (2 wheels);Oxygen (2L) Nurse Communication: Mobility status  Activity Tolerance: Patient tolerated treatment well Patient left: in bed;with call bell/phone within reach;with bed alarm set;with family/visitor present;with nursing/sitter in room (RN and NT performing pericare)  OT Visit Diagnosis: Unsteadiness on feet (R26.81);Other abnormalities of gait and mobility (R26.89);Muscle weakness (generalized) (M62.81)                Time: 8480-8454 OT Time Calculation (min): 26 min Charges:  OT General Charges $OT Visit: 1 Visit OT Evaluation $OT Eval Moderate Complexity: 1 Mod OT  Treatments $Self Care/Home Management : 8-22 mins  Pammie Chirino K, OTD, OTR/L SecureChat Preferred Acute Rehab [phone]   Laneta MARLA Pereyra 04/27/2024, 4:17 PM

## 2024-04-27 NOTE — Plan of Care (Signed)
  Problem: Education: Goal: Knowledge of General Education information will improve Description: Including pain rating scale, medication(s)/side effects and non-pharmacologic comfort measures Outcome: Progressing   Problem: Nutrition: Goal: Adequate nutrition will be maintained Outcome: Progressing   Problem: Coping: Goal: Level of anxiety will decrease Outcome: Progressing   Problem: Elimination: Goal: Will not experience complications related to urinary retention Outcome: Progressing   Problem: Pain Managment: Goal: General experience of comfort will improve and/or be controlled Outcome: Progressing   Problem: Safety: Goal: Ability to remain free from injury will improve Outcome: Progressing   Problem: Skin Integrity: Goal: Risk for impaired skin integrity will decrease Outcome: Progressing   Problem: Education: Goal: Ability to describe self-care measures that may prevent or decrease complications (Diabetes Survival Skills Education) will improve Outcome: Progressing

## 2024-04-27 NOTE — Progress Notes (Addendum)
 PHARMACY - ANTICOAGULATION CONSULT NOTE  Pharmacy Consult for Heparin  (holding Apixaban ) Indication: atrial fibrillation  Allergies  Allergen Reactions   Codeine Nausea And Vomiting   Latex Rash    Oral blisters when used at dentist    Patient Measurements: Height: 6' (182.9 cm) Weight: 89 kg (196 lb 3.4 oz) IBW/kg (Calculated) : 77.6 HEPARIN  DW (KG): 85  Vital Signs: Temp: 97.6 F (36.4 C) (12/04 0328) Temp Source: Oral (12/04 0328) BP: 107/66 (12/04 0328) Pulse Rate: 74 (12/04 0328)  Labs: Recent Labs    04/25/24 1916 04/25/24 2339 04/26/24 0219 04/26/24 0734 04/26/24 1820 04/27/24 0353  HGB  --  12.6*  --   --   --  11.9*  HCT  --  39.7  --   --   --  37.1*  PLT  --  378  --   --   --  365  APTT  --   --   --   --  63* 88*  HEPARINUNFRC  --   --   --   --  >1.10*  --   CREATININE 1.04 1.09  --  1.10  --   --   TROPONINIHS  --  39* 38*  --   --   --     Estimated Creatinine Clearance: 62.7 mL/min (by C-G formula based on SCr of 1.1 mg/dL).  Assessment: 78 y/o M on with increasingly distended abdomen, on apixaban  PTA for afib, holding apixaban  and starting heparin  in case any procedures are needed, last dose of apixaban  was 12/2 around 1930. Will start heparin  this morning at 0730. Anticipate using aPTT to dose for now.   AM: aPTT therapeutic on 1300 units/hr, anti-Xa level still pending, presuming will be falsely elevated. Per RN, no signs/symptoms of bleeding.  Goal of Therapy:  Heparin  level 0.3-0.7 units/ml aPTT 66-102 seconds Monitor platelets by anticoagulation protocol: Yes   Plan:  Continue heparin  gtt at 1300 units/hr Monitor with aPTTs until correlates with heparin  level CBC daily  Thank you. Olam Monte, PharmD

## 2024-04-27 NOTE — TOC Progression Note (Signed)
 Transition of Care [facility]) - Progression Note    Patient Details  Name: Chase Combs MRN: [id number] Date of Birth: 20-Aug-1945  Transition of Care [facility]) CM/SW Contact  Whitfield Campanile, Student-Social Work Phone Number: 04/27/2024, 2:23 PM  Clinical Narrative:     2:19 PM: MSW intern presented back at bedside to provide SNF bed offers to patient and spouse. Stated first choice is [facility] and wanting more time to look over current offers for second choice.  [facility] stated they can't offer bed until patient is more stable.  Will follow up with spouse per patient's consent for second SNF choice.  TOC continuing to follow.  Whitfield Campanile, MSW Intern                    Expected Discharge Plan and Services                                               Social Drivers of Health (SDOH) Interventions SDOH Screenings   Food Insecurity: No Food Insecurity (04/22/2024)  Housing: Low Risk  (04/22/2024)  Transportation Needs: No Transportation Needs (04/22/2024)  Utilities: Not At Risk (04/22/2024)  Social Connections: Moderately Integrated (04/24/2024)  Tobacco Use: Low Risk  (04/22/2024)    Readmission Risk Interventions    01/26/2024   12:46 PM 10/21/2023   11:36 AM  Readmission Risk Prevention Plan  Transportation Screening Complete Complete  PCP or Specialist Appt within 5-7 Days  Complete  Home Care Screening  Complete  Medication Review (RN CM)  Complete  Medication Review (RN Care Manager) Referral to Pharmacy   PCP or Specialist appointment within 3-5 days of discharge Complete   HRI or Home Care Consult Complete   SW Recovery Care/Counseling Consult Complete   Palliative Care Screening Not Applicable   Skilled Nursing Facility Not Applicable

## 2024-04-27 NOTE — Significant Event (Addendum)
 Patient's nurse noticed patient to have gross hematuria on urinating.  I reviewed patient's medications labs and notes.  I have known this patient from recent interaction when patient was transferred to the floor for worsening ileus.  Bladder scan done shows 491 cc of urinary retention for which I ordered In-N-Out cath.  We are holding heparin  infusion for now due to gross hematuria.  Will check stat CBC and trend CBC.  Recheck bladder scan in few hours and closely monitor for hematuria.     At 6 AM 04/28/2024 patient's hematuria has cleared will restart heparin  infusion and closely observe.  Patient is able to void urine and did not require In-N-Out cath.  Redia Cleaver MD.

## 2024-04-27 NOTE — Progress Notes (Signed)
  Progress Note  Patient Name: Chase Combs Date of Encounter: 04/27/2024  [facility] Cardiologist: Alm Clay, MD   Interval Summary   Patient remains in rate controlled A-Fib Continue IV amiodarone  and IV heparin  GI recommended palliative care for GOC discussion  Vital Signs Vitals:   04/26/24 2258 04/27/24 0328 04/27/24 0623 04/27/24 0841  BP: 117/74 107/66  95/62  Pulse: (!) 51 74 70 98  Resp: 20 20 19 20   Temp: 97.8 F (36.6 C) 97.6 F (36.4 C)  97.6 F (36.4 C)  TempSrc: Oral Oral  Oral  SpO2: 97% 97% 97% 96%  Weight:   90.3 kg   Height:        Intake/Output Summary (Last 24 hours) at 04/27/2024 0952 Last data filed at 04/27/2024 0842 Gross per 24 hour  Intake 360 ml  Output 1700 ml  Net -1340 ml      04/27/2024    6:23 AM 04/26/2024    5:35 AM 04/25/2024    4:45 AM  Last 3 Weights  Weight (lbs) 199 lb 1.2 oz 196 lb 3.4 oz 193 lb 12.6 oz  Weight (kg) 90.3 kg 89 kg 87.9 kg     Telemetry/ECG  Rate controlled A-Fib, HR 90s - Personally Reviewed  Physical Exam  GEN: No acute distress, drowsy.   Neck: No JVD Cardiac: iRRR, no murmurs, rubs, or gallops.  Respiratory: Clear to auscultation bilaterally. GI: Distended  MS: No edema  Assessment & Plan   Paroxysmal A-Fib/flutter with RVR Recently diagnosed, s/p recent DCCV  Home meds: amiodarone  200 mg daily, digoxin  0.0625 mg daily, Eliquis  5 mg BID Currently in A-Fib with HR 90s Started on IV amiodarone , remains on  Continue IV heparin  in setting of Eliquis     Coronary artery disease s/p DES to LAD 12/2023, prior PCI 2006, 2010 Hyperlipidemia  01/18/2024: HDL 37; LDL Cholesterol 49 04/23/2024: ALT 66  R/LHC 12/2023: severe single vessel CAD, stent to ostial/prox LAD, patent ramus intermedius stent, patent prox RCA stent  LHC 01/2024: ostially occluded stent with acute stent thrombosis in LAD, PTCA with balloon dilation, successful  Complex antiplatelet therapy history: on DAPT then discontinued  due to GI bleed, then started on IV cangrelor , then transitioned to Plavix  followed by stent thrombosis few hours later Finally placed on Plavix  75 mg daily as genetic 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  Continue Crestor  20 mg daily Continue Plavix  75 mg daily, will ideally continue without stopping, will re-assess if urgent procedure is planned   Ischemic cardiomyopathy  Hypertension LVEF 20-25%, RV moderately reduced  Home meds: digoxin  0.0625 mg daily, PO Lasix  40 mg BID with 20 mEq K BID, Entresto  24-26 mg BID, spironolactone  12.5 mg daily, Jardiance  10 mg daily No beta-blocker with recent low output  Appears euvolemic on exam  BP stable Renal function stable Continue daily weights, daily BMPs, strict I&O's  Presently home heart failure medications are on hold given hypotension    Per primary Possible ileus  Recurrent Ogilvie syndrome  Electrolyte disturbances AKI on CKD Peptic ulcer disease Diabetes Thyroid  nodule Chronic anemia Fall   For questions or updates, please contact [facility] Please consult [email or web address] for contact info under    Signed, [place] DELENA Donath, PA-C

## 2024-04-28 ENCOUNTER — Inpatient Hospital Stay (HOSPITAL_COMMUNITY)

## 2024-04-28 DIAGNOSIS — K560 Paralytic ileus: Secondary | ICD-10-CM | POA: Diagnosis not present

## 2024-04-28 DIAGNOSIS — I5042 Chronic combined systolic (congestive) and diastolic (congestive) heart failure: Secondary | ICD-10-CM | POA: Diagnosis not present

## 2024-04-28 DIAGNOSIS — J9601 Acute respiratory failure with hypoxia: Secondary | ICD-10-CM

## 2024-04-28 DIAGNOSIS — K5981 Ogilvie syndrome: Secondary | ICD-10-CM | POA: Diagnosis not present

## 2024-04-28 DIAGNOSIS — K529 Noninfective gastroenteritis and colitis, unspecified: Secondary | ICD-10-CM | POA: Diagnosis not present

## 2024-04-28 LAB — CBC WITH DIFFERENTIAL/PLATELET
Abs Immature Granulocytes: 0.05 K/uL (ref 0.00–0.07)
Basophils Absolute: 0 K/uL (ref 0.0–0.1)
Basophils Relative: 1 %
Eosinophils Absolute: 0 K/uL (ref 0.0–0.5)
Eosinophils Relative: 0 %
HCT: 37.7 % — ABNORMAL LOW (ref 39.0–52.0)
Hemoglobin: 12.2 g/dL — ABNORMAL LOW (ref 13.0–17.0)
Immature Granulocytes: 1 %
Lymphocytes Relative: 12 %
Lymphs Abs: 1 K/uL (ref 0.7–4.0)
MCH: 29.5 pg (ref 26.0–34.0)
MCHC: 32.4 g/dL (ref 30.0–36.0)
MCV: 91.3 fL (ref 80.0–100.0)
Monocytes Absolute: 0.9 K/uL (ref 0.1–1.0)
Monocytes Relative: 11 %
Neutro Abs: 6.2 K/uL (ref 1.7–7.7)
Neutrophils Relative %: 75 %
Platelets: 373 K/uL (ref 150–400)
RBC: 4.13 MIL/uL — ABNORMAL LOW (ref 4.22–5.81)
RDW: 19.3 % — ABNORMAL HIGH (ref 11.5–15.5)
Smear Review: NORMAL
WBC: 8.2 K/uL (ref 4.0–10.5)
nRBC: 0 % (ref 0.0–0.2)

## 2024-04-28 LAB — BASIC METABOLIC PANEL WITH GFR
Anion gap: 10 (ref 5–15)
BUN: 34 mg/dL — ABNORMAL HIGH (ref 8–23)
CO2: 23 mmol/L (ref 22–32)
Calcium: 8.2 mg/dL — ABNORMAL LOW (ref 8.9–10.3)
Chloride: 105 mmol/L (ref 98–111)
Creatinine, Ser: 0.96 mg/dL (ref 0.61–1.24)
GFR, Estimated: >60 mL/min (ref >60)
Glucose, Bld: 110 mg/dL — ABNORMAL HIGH (ref 70–99)
Potassium: 3.7 mmol/L (ref 3.5–5.1)
Sodium: 138 mmol/L (ref 135–145)

## 2024-04-28 LAB — BLOOD GAS, ARTERIAL
Acid-base deficit: 5.6 mmol/L — ABNORMAL HIGH (ref 0.0–2.0)
Bicarbonate: 19.3 mmol/L — ABNORMAL LOW (ref 20.0–28.0)
Drawn by: 24610
O2 Saturation: 99.6 %
Patient temperature: 36.4
pCO2 arterial: 34 mmHg (ref 32–48)
pH, Arterial: 7.36 (ref 7.35–7.45)
pO2, Arterial: 97 mmHg (ref 83–108)

## 2024-04-28 LAB — GLUCOSE, CAPILLARY
Glucose-Capillary: 108 mg/dL — ABNORMAL HIGH (ref 70–99)
Glucose-Capillary: 138 mg/dL — ABNORMAL HIGH (ref 70–99)
Glucose-Capillary: 122 mg/dL — ABNORMAL HIGH (ref 70–99)
Glucose-Capillary: 113 mg/dL — ABNORMAL HIGH (ref 70–99)
Glucose-Capillary: 115 mg/dL — ABNORMAL HIGH (ref 70–99)

## 2024-04-28 LAB — APTT: aPTT: 41 s — ABNORMAL HIGH (ref 24–36)

## 2024-04-28 LAB — DIGOXIN LEVEL: Digoxin Level: <0.6 ng/mL — ABNORMAL LOW (ref 0.8–2.0)

## 2024-04-28 LAB — HEPARIN LEVEL (UNFRACTIONATED): Heparin Unfractionated: >1.1 [IU]/mL — ABNORMAL HIGH (ref 0.30–0.70)

## 2024-04-28 MED ORDER — FUROSEMIDE 40 MG PO TABS: 40.0000 mg | ORAL_TABLET | Freq: Two times a day (BID) | ORAL | Status: DC

## 2024-04-28 MED ORDER — LIDOCAINE HCL URETHRAL/MUCOSAL 2 % EX GEL
1.0000 | Freq: Once | CUTANEOUS | Status: AC
  Filled 2024-04-28: qty 6

## 2024-04-28 MED ORDER — TAMSULOSIN HCL 0.4 MG PO CAPS
0.4000 mg | ORAL_CAPSULE | Freq: Every day | ORAL | Status: DC
  Administered 2024-04-28 – 2024-05-11 (×14): 0.4 mg via ORAL
  Filled 2024-04-28 (×15): qty 1

## 2024-04-28 MED ORDER — FUROSEMIDE 40 MG PO TABS
40.0000 mg | ORAL_TABLET | Freq: Every day | ORAL | Status: DC
  Administered 2024-04-28 – 2024-05-11 (×14): 40 mg via ORAL
  Filled 2024-04-28 (×14): qty 1

## 2024-04-28 MED ORDER — REVEFENACIN 175 MCG/3ML IN SOLN
175.0000 ug | Freq: Every day | RESPIRATORY_TRACT | Status: DC
  Administered 2024-04-28 – 2024-05-11 (×14): 175 ug via RESPIRATORY_TRACT
  Filled 2024-04-28 (×17): qty 3

## 2024-04-28 MED ORDER — BUDESONIDE 0.25 MG/2ML IN SUSP
0.2500 mg | Freq: Two times a day (BID) | RESPIRATORY_TRACT | Status: DC
  Administered 2024-04-28 – 2024-05-11 (×25): 0.25 mg via RESPIRATORY_TRACT
  Filled 2024-04-28 (×27): qty 2

## 2024-04-28 MED ORDER — AZITHROMYCIN 500 MG PO TABS
500.0000 mg | ORAL_TABLET | Freq: Every day | ORAL | Status: DC
  Administered 2024-04-28 – 2024-05-01 (×4): 500 mg via ORAL
  Filled 2024-04-28 (×4): qty 1

## 2024-04-28 MED ORDER — SODIUM CHLORIDE 0.9 % IV SOLN
2.0000 g | Freq: Three times a day (TID) | INTRAVENOUS | Status: DC
  Administered 2024-04-28 – 2024-05-01 (×9): 2 g via INTRAVENOUS
  Filled 2024-04-28 (×9): qty 12.5

## 2024-04-28 MED ORDER — METHYLNALTREXONE BROMIDE 12 MG/0.6ML ~~LOC~~ SOLN
8.0000 mg | SUBCUTANEOUS | Status: AC
  Administered 2024-04-28 – 2024-04-29 (×2): 8 mg via SUBCUTANEOUS
  Filled 2024-04-28 (×3): qty 0.6

## 2024-04-28 MED ORDER — IOHEXOL 350 MG/ML SOLN
75.0000 mL | Freq: Once | INTRAVENOUS | Status: AC | PRN
  Administered 2024-04-28: 75 mL via INTRAVENOUS

## 2024-04-28 MED ORDER — IPRATROPIUM-ALBUTEROL 0.5-2.5 (3) MG/3ML IN SOLN
3.0000 mL | Freq: Four times a day (QID) | RESPIRATORY_TRACT | Status: DC
  Administered 2024-04-28 – 2024-04-29 (×4): 3 mL via RESPIRATORY_TRACT
  Filled 2024-04-28 (×4): qty 3

## 2024-04-28 MED ORDER — SODIUM CHLORIDE 0.9 % IV SOLN: 500.0000 mg | INTRAVENOUS | Status: DC

## 2024-04-28 NOTE — Progress Notes (Signed)
 REDS Clip  READING (normal 20-35%) = 40  CHEST RULER (in) =23 Clip Station =  C   Patient Reds reading 40, had some difficulty with test due to patient becoming more short of breath with sitting up in the correct position for Reds reading. Beckey Coe, NP with Advanced Heart Failure Team aware of results, she was contacting Dr. Loni with result as well.    Stephane Haddock, BSN, Scientist, Clinical (histocompatibility And Immunogenetics) Only

## 2024-04-28 NOTE — Progress Notes (Signed)
 MEWS Progress Note  Patient Details Name: Chase Combs MRN: [id number] DOB: [birth year] Today's Date: 04/28/2024   MEWS Flowsheet Documentation:  Assess: MEWS Score Temp: 97.9 F (36.6 C) BP: 106/62 MAP (mmHg): 75 Pulse Rate: (!) 110 ECG Heart Rate: (!) 103 Resp: (!) 21 Level of Consciousness: Alert SpO2: 94 % O2 Device: Room Air Patient Activity (if Appropriate): In bed O2 Flow Rate (L/min): 2 L/min Assess: MEWS Score MEWS Temp: 0 MEWS Systolic: 0 MEWS Pulse: 1 MEWS RR: 1 MEWS LOC: 0 MEWS Score: 2 MEWS Score Color: Yellow Assess: SIRS CRITERIA SIRS Temperature : 0 SIRS Respirations : 1 SIRS Pulse: 1 SIRS WBC: 0 SIRS Score Sum : 2 SIRS Temperature : 0 SIRS Pulse: 1 SIRS Respirations : 1 SIRS WBC: 0 SIRS Score Sum : 2 Assess: if the MEWS score is Yellow or Red Were vital signs accurate and taken at a resting state?: Yes Does the patient meet 2 or more of the SIRS criteria?: No MEWS guidelines implemented : Yes, yellow Treat MEWS Interventions: Considered administering scheduled or prn medications/treatments as ordered Take Vital Signs Increase Vital Sign Frequency : Yellow: Q2hr x1, continue Q4hrs until patient remains green for 12hrs Escalate MEWS: Escalate: Yellow: Discuss with charge nurse and consider notifying provider and/or RRT Notify: Charge Nurse/RN Name of Charge Nurse/RN Notified: nego Provider Notification Provider Name/Title: Donnamarie Date Provider Notified: 04/28/24 Time Provider Notified: [id number]      [name] 04/28/2024, 1:50 PM

## 2024-04-28 NOTE — Progress Notes (Signed)
 Pt enema administered about 550 ml of tap water  was instilled, patient able to hold in some we took a lot of breaks and I was able to get 550 in him. Afterwards patient probably had about 250-300 ml of stool brown and liquid come out. Pt stated his stomach felt better. External rectal pouch put on due to Flexical leaking significantly even after reinsertion this morning   [name], RN 04/28/2024 5:34 PM

## 2024-04-28 NOTE — Consult Note (Signed)
 Consultation: Gross hematuria, urinary retention Requested by: Chase Lebron PARAS, MD    History of Present Illness: 78 year old male patient with extensive medical history including ischemic cardiomyopathy with EF 20 to 25%, chronic atrial fibrillation, coronary artery disease, atrial fibrillation, and recurrent acute colonic pseudo obstruction now with hypoxia.  He is being diuresed and noticed progressive difficulty voiding the last couple of days.  Nursing tried to place a Foley catheter which was unsuccessful.  Patient noted gross hematuria last couple of days (red urine, no clots). Renal ultrasound revealed no hydronephrosis but a moderately distended bladder with some debris vs clot. Creatinine 0.96 - baseline.  A November 2025 CT scan revealed a benign GU tract. Pt reports he doesn't know if he has a weak stream, frequency, urgency, dysuria or gross hematuria.   He usually follows with Dr. 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  November 2025 PSA reported as low in office notes.  He has a history of BPH and a 95 g prostate on biopsy in 2022.  Finasteride started in 2018 after an episode of retention.  Now on tamsulosin .  Past Medical History:  Diagnosis Date   Abdominal hernia    per pt   Arthritis    hands   Benign localized prostatic hyperplasia with lower urinary tract symptoms (LUTS)    CAD (coronary artery disease) 05/2004   cardiologist--- dr anner;  positive stress test , had outpt cardiac cath 05-27-2004 stenosis RI;  06-18-2004 cath w/ PCI and BMS x1 to pRI;   STEMI 02-16-2009  s/p cath w/ PCI thrombectomy for total occluded RCA , BMS x1 to pRCA;  nuclear stress test 03-15-2012 low risk no ischemia but sig inferior-inferolateral infarct , ef 51%   Dyslipidemia    Essential hypertension    Heart murmur    History of COVID-19 05/2020   per pt mild symptoms that resolved   History of  ST elevation myocardial infarction (STEMI) 02/16/2009   inferior STEMI   cath w/ pci w/ BMS to pRCA   History of urinary retention    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[facility]) 04/2021   urologist--- dr borden/ radiation oncology-- dr patrcia;  dx 12/ 2022,  Gleason 4+5, PSA 24.6, volume 94.6cc;  plan to start IMRT   OSA (obstructive sleep apnea)    per pt dx approx 2008, no cpap intolerant   PONV (postoperative nausea and vomiting)    Pre-diabetes    S/p bare metal coronary artery stent    01/ 2006  x1 BMS to pRI (CoStar study stent);  and 09/ 2010  x1 BMS to pRCA   Status post total knee replacement, 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   Past Surgical History:  Procedure Laterality Date   BOWEL DECOMPRESSION N/A 02/18/2024   Procedure: DECOMPRESSION, INTESTINE;  Surgeon: Albertus Gordy HERO, MD;  Location: [facility] ENDOSCOPY;  Service: Gastroenterology;  Laterality: N/A;   CARDIAC CATHETERIZATION  05/27/2004   outpatient by dr burnard [facility] cardiology) for positive stress test;  80% stensis pRI   CARDIOVERSION N/A 02/21/2024   Procedure: CARDIOVERSION;  Surgeon: Cherrie Toribio SAUNDERS, MD;  Location: [facility] INVASIVE CV LAB;  Service: Cardiovascular;  Laterality: N/A;   COLONOSCOPY N/A 02/18/2024   Procedure: COLONOSCOPY;  Surgeon: Albertus Gordy HERO, MD;  Location: [facility] ENDOSCOPY;  Service: Gastroenterology;  Laterality: N/A;   CORONARY ANGIOPLASTY WITH STENT PLACEMENT  06/18/2004   @[facility]  by Dr Lavon;  PCI w/ BMS  to pRI  (CoStar study stent - 2.5 x 16)   CORONARY ANGIOPLASTY WITH STENT PLACEMENT  02/16/2009   @[facility]  by dr verlin;   Inferior STEMI:  PCI w/ thrombectomy total occluded RCA, BMS x1 to pRCA (Vision BMS 3.5 x 23 -> 4.0 mm), residual 40-50% pLAD, ef 45-50%   CORONARY PRESSURE/FFR STUDY N/A 01/17/2024   Procedure: CORONARY PRESSURE/FFR STUDY;  Surgeon: Mady Bruckner, MD;  Location: [facility] INVASIVE CV LAB;  Service: Cardiovascular;  Laterality: N/A;   CORONARY STENT INTERVENTION N/A 01/17/2024   Procedure: CORONARY STENT  INTERVENTION;  Surgeon: Mady Bruckner, MD;  Location: [facility] INVASIVE CV LAB;  Service: Cardiovascular;  Laterality: N/A;   CORONARY ULTRASOUND/IVUS N/A 01/17/2024   Procedure: Coronary Ultrasound/IVUS;  Surgeon: Mady Bruckner, MD;  Location: [facility] INVAS[facility] CV LAB;  Service: Cardiovascular;  Laterality: N/A;   CORONARY/GRAFT ACUTE MI REVASCULARIZATION N/A 02/01/2024   Procedure: Coronary/Graft Acute MI Revascularization;  Surgeon: Elmira Newman PARAS, MD;  Location: [facility] INV[facility] CV LAB;  Service: Cardiovascular;  Laterality: N/A;   ESOPHAGOGASTRODUODENOSCOPY N/A 02/02/2024   Procedure: EGD (ESOPHAGOGASTRODUODENOSCOPY);  Surgeon: Nandigam, Kavitha V, MD;  Location: [facility] ENDOSCOPY;  Service: Gastroenterology;  Laterality: N/A;   FLEXIBLE SIGMOIDOSCOPY N/A 10/20/2023   Procedure: KINGSTON SIDE;  Surgeon: Legrand Victory LITTIE DOUGLAS, MD;  Location: [facility] ENDOSCOPY;  Service: Gastroenterology;  Laterality: N/A;   FLEXIBLE SIGMOIDOSCOPY N/A 02/02/2024   Procedure: SIGMOIDOSCOPY, FLEXIBLE;  Surgeon: Shila Gustav GAILS, MD;  Location: [facility] ENDOSCOPY;  Service: Gastroenterology;  Laterality: N/A;   GOLD SEED IMPLANT N/A 09/19/2021   Procedure: GOLD SEED IMPLANT;  Surgeon: Cam Morene ORN, MD;  Location: [facility];  Service: Urology;  Laterality: N/A;  ONLY NEEDS 30 MIN FOR ALL   INGUINAL HERNIA REPAIR Right 09/02/1999   @[facility] ;   recurrent repair right inguinal hernia;   previous repair approx 1990s   INGUINAL HERNIA REPAIR Left    1990s   KNEE ARTHROSCOPY W/ MENISCAL REPAIR Right 12/18/2005   @[facility]    LUMBAR DISC SURGERY  1982   L5--S1   RIGHT/LEFT HEART CATH AND CORONARY ANGIOGRAPHY N/A 01/17/2024   Procedure: RIGHT/LEFT HEART CATH AND CORONARY ANGIOGRAPHY;  Surgeon: Mady Bruckner, MD;  Location: [facility] INVASIVE CV LAB;  Service: Cardiovascular;  Laterality: N/A;   SPACE OAR INSTILLATION N/A 09/19/2021   Procedure: SPACE OAR INSTILLATION;  Surgeon: Cam Morene ORN, MD;  Location: [facility];  Service: Urology;  Laterality: N/A;   TOTAL KNEE ARTHROPLASTY Left 10/01/2023   Procedure: ARTHROPLASTY, KNEE, TOTAL;  Surgeon: Kay Kemps, MD;  Location: [facility] ORS;  Service: Orthopedics;  Laterality: Left;   TRANSESOPHAGEAL ECHOCARDIOGRAM (CATH LAB) N/A 02/21/2024   Procedure: TRANSESOPHAGEAL ECHOCARDIOGRAM;  Surgeon: Cherrie Toribio SAUNDERS, MD;  Location: [facility] INVASIVE CV LAB;  Service: Cardiovascular;  Laterality: N/A;    Home Medications:  Medications Prior to Admission  Medication Sig Dispense Refill Last Dose/Taking   amiodarone  (PACERONE ) 200 MG tablet Take 1 tablet (200 mg total) by mouth daily. 60 tablet 0 Past Week   apixaban  (ELIQUIS ) 5 MG TABS tablet Take 1 tablet (5 mg total) by mouth 2 (two) times daily. 60 tablet 5 04/21/2024   CALCIUM  PO Take 500 mg by mouth daily.   Past Week   clopidogrel  (PLAVIX ) 75 MG tablet Take 1 tablet (75 mg total) by mouth daily. 30 tablet 5 Past Week   cyanocobalamin  (VITAMIN B12) 1000 MCG tablet Take 1,000 mcg by mouth daily.   Past Week   digoxin  (LANOXIN ) 0.125 MG tablet Take 1/2  tablet (0.0625 mg total) by mouth daily. 15 tablet 5 Past Week   empagliflozin  (JARDIANCE ) 10 MG TABS tablet Take 1 tablet (10 mg total) by mouth daily before breakfast. 30 tablet 6 Past Week   furosemide  (LASIX ) 40 MG tablet Take 1 tablet (40 mg total) by mouth 2 (two) times daily. 90 tablet 3 Past Week   hydrOXYzine (ATARAX) 25 MG tablet Take 12.5-25 mg by mouth at bedtime as needed for anxiety.   Unknown   Multiple Vitamins-Minerals (MULTIVITAMIN MEN 50+) TABS Take 1 tablet by mouth daily.   Past Week   nitroGLYCERIN  (NITROSTAT ) 0.4 MG SL tablet Place 0.4 mg under the tongue every 5 (five) minutes as needed for chest pain.   Unknown   ondansetron  (ZOFRAN ) 8 MG tablet Take 8 mg by mouth every 8 (eight) hours as needed for nausea or vomiting.   Unknown   pantoprazole  (PROTONIX ) 40 MG tablet Take 1 tablet (40 mg total) by mouth daily. 30 tablet 0 Past Week    potassium chloride  SA (KLOR-CON  M) 20 MEQ tablet Take 1 tablet (20 mEq total) by mouth 2 (two) times daily. 90 tablet 3 Past Week   Probiotic Product (PROBIOTIC PO) Take 1,000 mg by mouth daily. Chew   Past Week   rosuvastatin  (CRESTOR ) 20 MG tablet TAKE 1 TABLET(20 MG) BY MOUTH DAILY 30 tablet 11 Past Week   sacubitril -valsartan  (ENTRESTO ) 24-26 MG Take 1 tablet by mouth 2 (two) times daily. 60 tablet 2 Past Week   sertraline (ZOLOFT) 50 MG tablet Take 50 mg by mouth daily.   Past Week   spironolactone  (ALDACTONE ) 25 MG tablet Take 0.5 tablets (12.5 mg total) by mouth daily. 45 tablet 3 Past Week   tamsulosin  (FLOMAX ) 0.4 MG CAPS capsule Take 1 capsule (0.4 mg total) by mouth daily after supper. 30 capsule 5 Past Week   Allergies:  Allergies  Allergen Reactions   Codeine Nausea And Vomiting   Latex Rash    Oral blisters when used at dentist    Family History  Problem Relation Age of Onset   Lung cancer Mother    Colon cancer Neg Hx    Esophageal cancer Neg Hx    Social History:  reports that he has never smoked. He has never used smokeless tobacco. He reports that he does not drink alcohol and does not use drugs.  ROS: A complete review of systems was performed.  All systems are negative except for pertinent findings as noted. Review of Systems  All other systems reviewed and are negative.    Physical Exam:  Vital signs in last 24 hours: Temp:  [97.6 F (36.4 C)-98.1 F (36.7 C)] 97.6 F (36.4 C) (12/05 1757) Pulse Rate:  [59-120] 59 (12/05 1757) Resp:  [20-27] 20 (12/05 1757) BP: (103-121)/(57-87) 104/57 (12/05 1757) SpO2:  [94 %-99 %] 99 % (12/05 1757) Weight:  [88 kg] 88 kg (12/05 0401) General:  Alert and in No acute distress - nursing assessed and cleaned patient, removed ECD.  HEENT: Normocephalic, atraumatic Cardiovascular: Regular rate and rhythm Lungs: Regular rate and effort Abdomen: Soft, nontender, nondistended, no abdominal masses Back: No CVA  tenderness Extremities: No edema Neurologic: Grossly intact GU: Penis uncircumcised with a tight phimosis. No lesions or bleeding.   Procedure: The penis was prepped and draped in the usual sterile fashion a council tip catheter was advanced foreskin into the meatus and into the bladder without difficulty.  About 350 cc of dark urine drained.  Not red.  More  very light cola colored.  And then hand irrigated the catheter with a Toomey syringe and irrigated normally without clot.  There was equal return.  Laboratory Data:  Results for orders placed or performed during the hospital encounter of 04/21/24 (from the past 24 hours)  Glucose, capillary     Status: Abnormal   Collection Time: 04/27/24  8:06 PM  Result Value Ref Range   Glucose-Capillary 110 (H) 70 - 99 mg/dL  Glucose, capillary     Status: Abnormal   Collection Time: 04/27/24 11:27 PM  Result Value Ref Range   Glucose-Capillary 116 (H) 70 - 99 mg/dL  Digoxin  level     Status: Abnormal   Collection Time: 04/28/24 12:16 AM  Result Value Ref Range   Digoxin  Level <0.6 (L) 0.8 - 2.0 ng/mL  CBC with Differential/Platelet     Status: Abnormal   Collection Time: 04/28/24 12:16 AM  Result Value Ref Range   WBC 8.2 4.0 - 10.5 K/uL   RBC 4.13 (L) 4.22 - 5.81 MIL/uL   Hemoglobin 12.2 (L) 13.0 - 17.0 g/dL   HCT 62.2 (L) 60.9 - 47.9 %   MCV 91.3 80.0 - 100.0 fL   MCH 29.5 26.0 - 34.0 pg   MCHC 32.4 30.0 - 36.0 g/dL   RDW 80.6 (H) 88.4 - 84.4 %   Platelets 373 150 - 400 K/uL   nRBC 0.0 0.0 - 0.2 %   Neutrophils Relative % 75 %   Neutro Abs 6.2 1.7 - 7.7 K/uL   Lymphocytes Relative 12 %   Lymphs Abs 1.0 0.7 - 4.0 K/uL   Monocytes Relative 11 %   Monocytes Absolute 0.9 0.1 - 1.0 K/uL   Eosinophils Relative 0 %   Eosinophils Absolute 0.0 0.0 - 0.5 K/uL   Basophils Relative 1 %   Basophils Absolute 0.0 0.0 - 0.1 K/uL   WBC Morphology MORPHOLOGY UNREMARKABLE    RBC Morphology MORPHOLOGY UNREMARKABLE    Smear Review Normal platelet  morphology    Immature Granulocytes 1 %   Abs Immature Granulocytes 0.05 0.00 - 0.07 K/uL  Basic metabolic panel with GFR     Status: Abnormal   Collection Time: 04/28/24 12:16 AM  Result Value Ref Range   Sodium 138 135 - 145 mmol/L   Potassium 3.7 3.5 - 5.1 mmol/L   Chloride 105 98 - 111 mmol/L   CO2 23 22 - 32 mmol/L   Glucose, Bld 110 (H) 70 - 99 mg/dL   BUN 34 (H) 8 - 23 mg/dL   Creatinine, Ser 9.03 0.61 - 1.24 mg/dL   Calcium  8.2 (L) 8.9 - 10.3 mg/dL   GFR, Estimated >39 >39 mL/min   Anion gap 10 5 - 15  Glucose, capillary     Status: Abnormal   Collection Time: 04/28/24  3:58 AM  Result Value Ref Range   Glucose-Capillary 108 (H) 70 - 99 mg/dL  Glucose, capillary     Status: Abnormal   Collection Time: 04/28/24  8:40 AM  Result Value Ref Range   Glucose-Capillary 138 (H) 70 - 99 mg/dL  Glucose, capillary     Status: Abnormal   Collection Time: 04/28/24  1:57 PM  Result Value Ref Range   Glucose-Capillary 122 (H) 70 - 99 mg/dL  Heparin  level (unfractionated)     Status: Abnormal   Collection Time: 04/28/24  2:26 PM  Result Value Ref Range   Heparin  Unfractionated >1.10 (H) 0.30 - 0.70 IU/mL  APTT     Status:  Abnormal   Collection Time: 04/28/24  2:26 PM  Result Value Ref Range   aPTT 41 (H) 24 - 36 seconds  Glucose, capillary     Status: Abnormal   Collection Time: 04/28/24  3:57 PM  Result Value Ref Range   Glucose-Capillary 113 (H) 70 - 99 mg/dL   Recent Results (from the past 240 hours)  Resp panel by RT-PCR (RSV, Flu A&B, Covid) Anterior Nasal Swab     Status: None   Collection Time: 04/21/24  7:51 PM   Specimen: Anterior Nasal Swab  Result Value Ref Range Status   SARS Coronavirus 2 by RT PCR NEGATIVE NEGATIVE Final    Comment: (NOTE) SARS-CoV-2 target nucleic acids are NOT DETECTED.  The SARS-CoV-2 RNA is generally detectable in upper respiratory specimens during the acute phase of infection. The lowest concentration of SARS-CoV-2 viral copies this  assay can detect is 138 copies/mL. A negative result does not preclude SARS-Cov-2 infection and should not be used as the sole basis for treatment or other patient management decisions. A negative result may occur with  improper specimen collection/handling, submission of specimen other than nasopharyngeal swab, presence of viral mutation(s) within the areas targeted by this assay, and inadequate number of viral copies(<138 copies/mL). A negative result must be combined with clinical observations, patient history, and epidemiological information. The expected result is Negative.  Fact Sheet for Patients:  bloggercourse.com  Fact Sheet for Healthcare Providers:  seriousbroker.it  This test is no t yet approved or cleared by the [place]  FDA and  has been authorized for detection and/or diagnosis of SARS-CoV-2 by FDA under an Emergency Use Authorization (EUA). This EUA will remain  in effect (meaning this test can be used) for the duration of the COVID-19 declaration under Section 564(b)(1) of the Act, 21 U.S.C.section 360bbb-3(b)(1), unless the authorization is terminated  or revoked sooner.       Influenza A by PCR NEGATIVE NEGATIVE Final   Influenza B by PCR NEGATIVE NEGATIVE Final    Comment: (NOTE) The Xpert Xpress SARS-CoV-2/FLU/RSV plus assay is intended as an aid in the diagnosis of influenza from Nasopharyngeal swab specimens and should not be used as a sole basis for treatment. Nasal washings and aspirates are unacceptable for Xpert Xpress SARS-CoV-2/FLU/RSV testing.  Fact Sheet for Patients: bloggercourse.com  Fact Sheet for Healthcare Providers: seriousbroker.it  This test is not yet approved or cleared by the [place]  FDA and has been authorized for detection and/or diagnosis of SARS-CoV-2 by FDA under an Emergency Use Authorization (EUA). This EUA will  remain in effect (meaning this test can be used) for the duration of the COVID-19 declaration under Section 564(b)(1) of the Act, 21 U.S.C. section 360bbb-3(b)(1), unless the authorization is terminated or revoked.     Resp Syncytial Virus by PCR NEGATIVE NEGATIVE Final    Comment: (NOTE) Fact Sheet for Patients: bloggercourse.com  Fact Sheet for Healthcare Providers: seriousbroker.it  This test is not yet approved or cleared by the [place]  FDA and has been authorized for detection and/or diagnosis of SARS-CoV-2 by FDA under an Emergency Use Authorization (EUA). This EUA will remain in effect (meaning this test can be used) for the duration of the COVID-19 declaration under Section 564(b)(1) of the Act, 21 U.S.C. section 360bbb-3(b)(1), unless the authorization is terminated or revoked.  Performed at [facility], [address] [place], [place] [address]   C Difficile Quick Screen w PCR reflex     Status: None   Collection  Time: 04/22/24  6:33 PM   Specimen: STOOL  Result Value Ref Range Status   C Diff antigen NEGATIVE NEGATIVE Final   C Diff toxin NEGATIVE NEGATIVE Final   C Diff interpretation No C. difficile detected.  Final    Comment: Performed at [facility], [address], [place], [place] [address]  Gastrointestinal Panel by PCR , Stool     Status: None   Collection Time: 04/22/24  6:33 PM   Specimen: Stool  Result Value Ref Range Status   Campylobacter species NOT DETECTED NOT DETECTED Final   Plesimonas shigelloides NOT DETECTED NOT DETECTED Final   Salmonella species NOT DETECTED NOT DETECTED Final   Yersinia enterocolitica NOT DETECTED NOT DETECTED Final   Vibrio species NOT DETECTED NOT DETECTED Final   Vibrio cholerae NOT DETECTED NOT DETECTED Final   Enteroaggregative E coli (EAEC) NOT DETECTED NOT DETECTED Final   Enteropathogenic E coli (EPEC) NOT DETECTED NOT DETECTED  Final   Enterotoxigenic E coli (ETEC) NOT DETECTED NOT DETECTED Final   Shiga like toxin producing E coli (STEC) NOT DETECTED NOT DETECTED Final   Shigella/Enteroinvasive E coli (EIEC) NOT DETECTED NOT DETECTED Final   Cryptosporidium NOT DETECTED NOT DETECTED Final   Cyclospora cayetanensis NOT DETECTED NOT DETECTED Final   Entamoeba histolytica NOT DETECTED NOT DETECTED Final   Giardia lamblia NOT DETECTED NOT DETECTED Final   Adenovirus F40/41 NOT DETECTED NOT DETECTED Final   Astrovirus NOT DETECTED NOT DETECTED Final   Norovirus GI/GII NOT DETECTED NOT DETECTED Final   Rotavirus A NOT DETECTED NOT DETECTED Final   Sapovirus (I, II, IV, and V) NOT DETECTED NOT DETECTED Final    Comment: Performed at [facility], [address] [place] [place], [place] [address]   Creatinine: Recent Labs    04/24/24 1756 04/25/24 0745 04/25/24 1916 04/25/24 2339 04/26/24 0734 04/27/24 0353 04/28/24 0016  CREATININE 1.16 1.11 1.04 1.09 1.10 1.03 0.96   Ultrasound and CT scan images reviewed.   Impression/Assessment:  BPH, phimosis, reported gross hematuria, h/o PCa - urine appeared clear on foley placement and irrigates normally.   Plan:  Continue tamsulosin  and foley. Consider voiding trial once patient SOB improves and he is more ambulatory.  Consider outpatient cystoscopy.  Donnice Brooks 04/28/2024

## 2024-04-28 NOTE — Progress Notes (Addendum)
 Progress Note  Patient Name: Chase Combs Date of Encounter: 04/28/2024 [facility] Cardiologist: Alm Clay, MD   Interval Summary   Patient remains in rate controlled A-Fib Continue IV amiodarone  and IV heparin  - heparin  paused for hematuria, but resumed when hematuria cleared. GI recommended palliative care for GOC discussion He is rousable to questioning and answers appropriately but is somnolent. Denies pain at the moment.   Vital Signs Vitals:   04/28/24 0000 04/28/24 0401 04/28/24 0402 04/28/24 0800  BP:   121/87 103/68  Pulse:  (!) 120  98  Resp:  (!) 27 (!) 22 20  Temp: 97.9 F (36.6 C) 97.9 F (36.6 C)  98.1 F (36.7 C)  TempSrc: Oral Oral  Oral  SpO2:  98%  95%  Weight:  88 kg    Height:        Intake/Output Summary (Last 24 hours) at 04/28/2024 0937 Last data filed at 04/28/2024 0554 Gross per 24 hour  Intake 1656.42 ml  Output 550 ml  Net 1106.42 ml      04/28/2024    4:01 AM 04/27/2024    6:23 AM 04/26/2024    5:35 AM  Last 3 Weights  Weight (lbs) 194 lb 0.1 oz 199 lb 1.2 oz 196 lb 3.4 oz  Weight (kg) 88 kg 90.3 kg 89 kg     Telemetry/ECG  Rate controlled A-Fib, HR 90s - Personally Reviewed  Physical Exam  GEN: No acute distress, drowsy.   Neck: No JVD Cardiac: iRRR, no murmurs, rubs, or gallops.  Respiratory: Clear to auscultation bilaterally. GI: Distended, tinkling bowel sounds heard throughout abdomen MS: No edema, warm on exam.   Assessment & Plan   Persistent A-Fib/flutter with RVR Recently diagnosed, s/p recent DCCV  Home meds: amiodarone  200 mg daily, digoxin  0.0625 mg daily, Eliquis  5 mg BID Currently in A-Fib with HR 90s  IV amiodarone  Continue IV heparin  - On reviewing telemetry from [facility] as well as multiple units in the hospital, there is no telemetry evidence of heart rates less than 65 bpm.  I do not clearly see evidence of bradycardia.  Suspect the recorded vital signs that suggest bradycardia unless  formally documented elsewhere are likely spurious due to atrial fibrillation.  Reasonable to consider medical therapy for pseudoobstruction if indicated and can cautiously monitor heart rates in ICU.   Coronary artery disease s/p DES to LAD 12/2023, prior PCI 2006, 2010 Hyperlipidemia  01/18/2024: HDL 37; LDL Cholesterol 49 04/23/2024: ALT 66  R/LHC 12/2023: severe single vessel CAD, stent to ostial/prox LAD, patent ramus intermedius stent, patent prox RCA stent  LHC 01/2024: ostially occluded stent with acute stent thrombosis in LAD, PTCA with balloon dilation, successful  Complex antiplatelet therapy history: on DAPT then discontinued due to GI bleed, then started on IV cangrelor , then transitioned to Plavix  followed by stent thrombosis few hours later Finally placed on Plavix  75 mg daily as genetic 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  Continue Crestor  20 mg daily Continue Plavix  75 mg daily, will ideally continue without stopping, will re-assess if urgent procedure is planned   Ischemic cardiomyopathy  Hypertension LVEF 20-25%, RV moderately reduced  Home meds: digoxin  0.0625 mg daily, PO Lasix  40 mg BID with 20 mEq K BID, Entresto  24-26 mg BID, spironolactone  12.5 mg daily, Jardiance  10 mg daily No beta-blocker with recent low output  BP stable Renal function stable Continue daily weights, daily BMPs, strict I&O's  Presently home heart failure medications are on hold given hypotension  -  d/w AHF service, will obtain a Redsclip today to assess if volume up despite warm and dry appearing. I have discussed his care in a multidisciplinary fashion with the primary team and GI.  GI feels there are no optimal options for treatment at this time and recommend goals of care discussion as he is significantly debilitated.  I remain concerned about low output state, however lactate on admission was normal, renal function is normal, he is not hypotensive in the last 24 hours.  And it is unlikely that his  altered mental status is primarily driven by low output state.  Would have a low threshold to recheck a lactic acid if he becomes hypotensive or kidney function changes.  However overall the patient remains quite debilitated and limited options for treatment of his pseudoobstruction, we will await further guidance from palliative care and goals of care whether to pursue a more aggressive cardiovascular investigation. -addendum: redsclip 40% (elevated). Will reinitiate oral lasix , 40 mg po daily (home dose is lasix  40 mg BID). If not taking po, convert to IV.  GOC: - Do not suspect his somnolence is cardiac in origin, will get redsclip today for volume status. As above, low threshold to repeat cardiac investigations of lactate/picc/cvp/coox if clinical change occurs.     Per primary Ileus  Recurrent Ogilvie syndrome  Electrolyte disturbances AKI on CKD Peptic ulcer disease Diabetes Thyroid  nodule Chronic anemia Fall   For questions or updates, please contact [facility] Please consult [email or web address] for contact info under    Signed, Robi Dewolfe A Donelda Mailhot, MD

## 2024-04-28 NOTE — Plan of Care (Signed)

## 2024-04-28 NOTE — Progress Notes (Signed)
 PROGRESS NOTE  Chase Combs FMW:[id number] DOB: September 12, 1945 DOA: 04/21/2024 PCP: Wonda Worth SQUIBB, PA  HPI/Recap of past 24 hours: 78 y.o. 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 Of note, patient had recent complicated admission for septic shock, cardiogenic shock, intestinal pneumatosis/ileus/pseudoobstruction/Ogilvie syndrome. Now admitted for recurrent Ogilvie syndrome, suspected gastroenterocolitis, AKI on CKD, hypotension and acute debilitation.       Today, pt still SOB, very lethargic, abdomen still distended, poor oral intake. Now with hematuria that started overnight with poor urine output. Also difficulty with placing a foley possibly due to anatomy. Overall very poor prognosis given advanced HF    Assessment/Plan: Principal Problem:   Enterocolitis Active Problems:   Ogilvie syndrome   Chronic systolic CHF (congestive heart failure) (HCC)   Hypokalemia   HLD (hyperlipidemia)   Acute kidney injury   DM2 (diabetes mellitus, type 2) (HCC)   Postural dizziness with presyncope   Ileus, unspecified (HCC)   Elevated troponin   Fall at home, initial encounter   Hypotension   Paroxysmal atrial flutter (HCC)   Anemia of chronic disease   Adynamic ileus (HCC)   Chronic combined systolic and diastolic congestive heart failure (HCC)    Diffuse progressive ileus Recurrent Ogilvie Syndrome History of recent hospitalization (01/22/24-02/25/24) for similar GI symptoms with  extensive workup. Underwent colonic decompression (02/18/24) with improvement Currently afebrile, with no leukocytosis C diff and enteric PCR panel negative CT A/P w/o contrast with noted diffuse mild dilatation of colon, distended ileal loops and stomach Repeat abdominal x-ray on 12/3 showed diffuse ileus involving colon and small bowel GI consulted, appreciate recs, continue MiraLAX , rectal tube, high risk for complications with sedation for colonic decompression Continue clear liquids Aspiration precautions Monitor closely  Acute hypoxic respiratory failure Currently requiring about 3 L of O2 ABG showed pH 7.37, pO2 75, CO2 40 D-dimer mildly elevated Procalcitonin 0.26 Likely in the setting of abdominal distension from underlying ileus/pseudoobstruction/gastroenterocolitis in addition to advanced CHF (required IV fluid resuscitation due to AKI) CTA chest showing no PE, but new and increased bilateral ground glass opacity, airway thickening which may reflect atypical pneumonia, acute eosinophilic pneumonia, hypersensitivity pneumonitis, pulmonary edema or pulmonary hemorrhage, noted mucus in the right mainstem bronchus PCCM consulted Start IV antibiotics, DuoNebs, nebulizers Supplemental O2 as needed  CHFrEF Ischemic cardiomyopathy Recent prolonged hospitalization complicated by cardiogenic shock with acute CHF exacerbation in the setting of LAD stent stenosis  BNP 1984 Last echo 01/2024 with LVEF 20-25% CXR 12/2 showed bilateral opacities Cardiology consulted, appreciate recs Restart lasix  40 mg daily, hold spironolactone , entresto , digoxin , Jardiance  Monitor I/Os, daily weights, renal function  Paroxysmal Afib/flutter with RVR Heart rate improved S/p TEE CV on 02/21/2024 Cardiology on board, appreciate recs Continue IV amiodarone , IV heparin  held for now due to hematuria Eliquis  held Telemetry  Gross hematuria Noted hematuria on 12/4 Renal ultrasound showed enlarged prostate  protruding into the inferior bladder, layering debris's in the bladder could reflect blood clot Urology consulted, plan for possible bedside irrigation, with foley placement  CAD s/p DES to LAD 8/25 Hyperlipidemia Recent balloon angioplasty  of LAD stent thrombosis (02/01/2024) due to inability to tolerate oral DAPT  History of remote PCI to RI and RCA EKG with known RBBB, LAFB, no significant changes from prior Troponin 67 < 71 Cardiology consulted, appreciate recs Cont home plavix , held Eliquis , continue Crestor  Telemetry  Hypotension Improving Likely in the setting of dehydration/orthostatic hypotension from possible ileus/pseudoobstruction/gastroenterocolitis CT head unremarkable S/p IV fluids Clear liquid diet Continue to hold BP meds  Hypokalemia/hypomagnesemia Likely in the setting of ileus, frequent diarrhea Monitor and replete electrolytes as needed   AKI on CKD Resolved Cr 1.11 < 2.1, baseline 0.9-1.5 Likely in the setting of dehydration from diarrhea CT A/P with no concern for obstructive disease Holding home entresto , spironolactone  Cont external foley for strict I/Os Avoid nephrotoxic agents Renal dose medications Monitor BMP   Peptic ulcer disease Recent hospitalization with EGD (02/02/24) showing esophageal ulcer without bleeding, grade C reflux esophagitis without bleeding, duodenal erosions Cont pantoprazole    T2DM Hold home Jardiance  Cont SSI   Incidental R thyroid  nodule Noted on CT C spine TSH normal Will need outpatient thyroid  US    Chronic normocytic anemia Hgb 11.2, baseline 10-12 Daily CBC   Acute debilitation Mechanical fall Monitor on fall precautions PT/OT following- rec SNF  GOC discussion Patient with poor prognosis, recurrent severe ileus, advanced CHF Palliative care consulted, appreciate recs Currently DNR   Malnutrition Type:  Nutrition Problem: Moderate Malnutrition Etiology: chronic illness   Malnutrition  Characteristics:  Signs/Symptoms: moderate fat depletion, severe muscle depletion, moderate muscle depletion   Nutrition Interventions:  Interventions: Refer to RD note for recommendations    Estimated body mass index is 26.31 kg/m as calculated from the following:   Height as of this encounter: 6' (1.829 m).   Weight as of this encounter: 88 kg.     Code Status: DNR  Family Communication: Discussed with wife over the phone on 12/5  Disposition Plan: Status is: Inpatient Remains inpatient appropriate because: Level of care      Consultants: GI Cardiology PCCM Urology  Procedures: None  Antimicrobials: Cefepime  Azithromycin   DVT prophylaxis: IV heparin - held   Objective: Vitals:   04/28/24 1343 04/28/24 1600 04/28/24 1609 04/28/24 1613  BP: 106/62     Pulse: (!) 110     Resp: (!) 21 20    Temp: 97.9 F (36.6 C) 97.6 F (36.4 C)    TempSrc: Oral Oral    SpO2: 94%  98% 97%  Weight:      Height:        Intake/Output Summary (Last 24 hours) at 04/28/2024 1637 Last data filed at 04/28/2024 1621 Gross per 24 hour  Intake 2289.4 ml  Output 875 ml  Net 1414.4 ml   Filed Weights   04/26/24 0535 04/27/24 0623 04/28/24 0401  Weight: 89 kg 90.3 kg 88 kg    Exam: General: NAD, awake/alert, lethargic Cardiovascular: S1, S2 present Respiratory: Diminished BS bilaterally Abdomen: Firm, nontender, +distended, bowel sounds present Musculoskeletal: No bilateral pedal edema noted Skin: Normal Psychiatry: fair mood     Data Reviewed: CBC: Recent Labs  Lab 04/22/24 0331 04/23/24 0223 04/25/24 2339 04/27/24 0353 04/28/24 0016  WBC 4.7 5.0 8.0 7.9 8.2  NEUTROABS 2.9 2.9 5.6 5.7 6.2  HGB 11.1* 12.4* 12.6* 11.9* 12.2*  HCT 33.3* 38.2* 39.7 37.1* 37.7*  MCV 90.2 91.6 92.8 92.8 91.3  PLT 309 307 378 365 373   Basic Metabolic Panel: Recent Labs  Lab 04/22/24 1351 04/23/24 0223 04/23/24 1335 04/24/24 0653 04/24/24 1756 04/25/24 0535  04/25/24 0745 04/25/24 1916 04/25/24 2339 04/26/24 0734 04/27/24 0353 04/28/24 0016  NA 135 132*   < > 134*   < >  --    < > 137 136 138 138 138  K 3.4* 3.0*   < > 2.9*   < >  --    < > 3.8 4.3 3.9 4.2 3.7  CL 101 100   < > 103   < >  --    < > 108 110 107 107 105  CO2 19* 18*   < > 20*   < >  --    < > 21* 22 22 24 23   GLUCOSE 104* 109*   < > 88   < >  --    < > 135* 169* 157* 134* 110*  BUN 59* 68*   < > 70*   < >  --    < > 43* 45* 44* 41* 34*  CREATININE 2.05* 1.85*   < > 1.53*   < >  --    < > 1.04 1.09 1.10 1.03 0.96  CALCIUM  8.5* 8.5*   < > 8.2*   < >  --    < > 9.0 8.4* 8.7* 8.3* 8.2*  MG 2.0 2.0  --  2.1  --  2.2  --   --  2.3  --   --   --    < > = values in this interval not displayed.   GFR: Estimated Creatinine Clearance: 71.9 mL/min (by C-G formula based on SCr of 0.96 mg/dL). Liver Function Tests: Recent Labs  Lab 04/21/24 1951 04/22/24 0331 04/23/24 0223  AST 22 27 52*  ALT 20 25 66*  ALKPHOS 86 54 60  BILITOT 0.8 0.7 0.5  PROT 7.3 5.6* 5.7*  ALBUMIN 4.1 2.7* 2.6*   No results for input(s): LIPASE, AMYLASE in the last 168 hours. No results for input(s): AMMONIA in the last 168 hours. Coagulation Profile: Recent Labs  Lab 04/21/24 1951  INR 1.7*   Cardiac Enzymes: No results for input(s): CKTOTAL, CKMB, CKMBINDEX, TROPONINI in the last 168 hours. BNP (last 3 results) Recent Labs    01/07/24 2301  PROBNP 15,245.0*   HbA1C: No results for input(s): HGBA1C in the last 72 hours. CBG: Recent Labs  Lab 04/27/24 2327 04/28/24 0358 04/28/24 0840 04/28/24 1357 04/28/24 1557  GLUCAP 116* 108* 138* 122* 113*   Lipid Profile: No results for input(s): CHOL, HDL, LDLCALC, TRIG, CHOLHDL, LDLDIRECT in the last 72 hours. Thyroid  Function Tests: No results for input(s): TSH, T4TOTAL, FREET4, T3FREE, THYROIDAB in the last 72 hours. Anemia Panel: No results for input(s): VITAMINB12, FOLATE, FERRITIN, TIBC,  IRON, RETICCTPCT in the last 72 hours. Urine analysis:    Component Value Date/Time   COLORURINE YELLOW 04/22/2024 1205   APPEARANCEUR CLEAR 04/22/2024 1205   LABSPEC >1.030 (H) 04/22/2024 1205   PHURINE 5.5 04/22/2024 1205   GLUCOSEU NEGATIVE 04/22/2024 1205   HGBUR NEGATIVE 04/22/2024 1205   BILIRUBINUR NEGATIVE 04/22/2024 1205   KETONESUR NEGATIVE 04/22/2024 1205   PROTEINUR NEGATIVE 04/22/2024 1205   UROBILINOGEN 0.2 02/17/2009 0645   NITRITE NEGATIVE 04/22/2024 1205   LEUKOCYTESUR NEGATIVE 04/22/2024 1205   Sepsis Labs: @LABRCNTIP (procalcitonin:4,lacticidven:4)  ) Recent Results (from the past 240 hours)  Resp panel by RT-PCR (RSV, Flu A&B, Covid) Anterior Nasal Swab     Status: None   Collection Time: 04/21/24  7:51 PM   Specimen: Anterior Nasal Swab  Result Value Ref Range Status  SARS Coronavirus 2 by RT PCR NEGATIVE NEGATIVE Final    Comment: (NOTE) SARS-CoV-2 target nucleic acids are NOT DETECTED.  The SARS-CoV-2 RNA is generally detectable in upper respiratory specimens during the acute phase of infection. The lowest concentration of SARS-CoV-2 viral copies this assay can detect is 138 copies/mL. A negative result does not preclude SARS-Cov-2 infection and should not be used as the sole basis for treatment or other patient management decisions. A negative result may occur with  improper specimen collection/handling, submission of specimen other than nasopharyngeal swab, presence of viral mutation(s) within the areas targeted by this assay, and inadequate number of viral copies(<138 copies/mL). A negative result must be combined with clinical observations, patient history, and epidemiological information. The expected result is Negative.  Fact Sheet for Patients:  bloggercourse.com  Fact Sheet for Healthcare Providers:  seriousbroker.it  This test is no t yet approved or cleared by the [place]  FDA  and  has been authorized for detection and/or diagnosis of SARS-CoV-2 by FDA under an Emergency Use Authorization (EUA). This EUA will remain  in effect (meaning this test can be used) for the duration of the COVID-19 declaration under Section 564(b)(1) of the Act, 21 U.S.C.section 360bbb-3(b)(1), unless the authorization is terminated  or revoked sooner.       Influenza A by PCR NEGATIVE NEGATIVE Final   Influenza B by PCR NEGATIVE NEGATIVE Final    Comment: (NOTE) The Xpert Xpress SARS-CoV-2/FLU/RSV plus assay is intended as an aid in the diagnosis of influenza from Nasopharyngeal swab specimens and should not be used as a sole basis for treatment. Nasal washings and aspirates are unacceptable for Xpert Xpress SARS-CoV-2/FLU/RSV testing.  Fact Sheet for Patients: bloggercourse.com  Fact Sheet for Healthcare Providers: seriousbroker.it  This test is not yet approved or cleared by the [place]  FDA and has been authorized for detection and/or diagnosis of SARS-CoV-2 by FDA under an Emergency Use Authorization (EUA). This EUA will remain in effect (meaning this test can be used) for the duration of the COVID-19 declaration under Section 564(b)(1) of the Act, 21 U.S.C. section 360bbb-3(b)(1), unless the authorization is terminated or revoked.     Resp Syncytial Virus by PCR NEGATIVE NEGATIVE Final    Comment: (NOTE) Fact Sheet for Patients: bloggercourse.com  Fact Sheet for Healthcare Providers: seriousbroker.it  This test is not yet approved or cleared by the [place]  FDA and has been authorized for detection and/or diagnosis of SARS-CoV-2 by FDA under an Emergency Use Authorization (EUA). This EUA will remain in effect (meaning this test can be used) for the duration of the COVID-19 declaration under Section 564(b)(1) of the Act, 21 U.S.C. section 360bbb-3(b)(1),  unless the authorization is terminated or revoked.  Performed at [facility], [address] [place], [place] [address]   C Difficile Quick Screen w PCR reflex     Status: None   Collection Time: 04/22/24  6:33 PM   Specimen: STOOL  Result Value Ref Range Status   C Diff antigen NEGATIVE NEGATIVE Final   C Diff toxin NEGATIVE NEGATIVE Final   C Diff interpretation No C. difficile detected.  Final    Comment: Performed at [facility], [address], [place], [place] [address]  Gastrointestinal Panel by PCR , Stool     Status: None   Collection Time: 04/22/24  6:33 PM   Specimen: Stool  Result Value Ref Range Status   Campylobacter species NOT DETECTED NOT DETECTED Final   Plesimonas shigelloides  NOT DETECTED NOT DETECTED Final   Salmonella species NOT DETECTED NOT DETECTED Final   Yersinia enterocolitica NOT DETECTED NOT DETECTED Final   Vibrio species NOT DETECTED NOT DETECTED Final   Vibrio cholerae NOT DETECTED NOT DETECTED Final   Enteroaggregative E coli (EAEC) NOT DETECTED NOT DETECTED Final   Enteropathogenic E coli (EPEC) NOT DETECTED NOT DETECTED Final   Enterotoxigenic E coli (ETEC) NOT DETECTED NOT DETECTED Final   Shiga like toxin producing E coli (STEC) NOT DETECTED NOT DETECTED Final   Shigella/Enteroinvasive E coli (EIEC) NOT DETECTED NOT DETECTED Final   Cryptosporidium NOT DETECTED NOT DETECTED Final   Cyclospora cayetanensis NOT DETECTED NOT DETECTED Final   Entamoeba histolytica NOT DETECTED NOT DETECTED Final   Giardia lamblia NOT DETECTED NOT DETECTED Final   Adenovirus F40/41 NOT DETECTED NOT DETECTED Final   Astrovirus NOT DETECTED NOT DETECTED Final   Norovirus GI/GII NOT DETECTED NOT DETECTED Final   Rotavirus A NOT DETECTED NOT DETECTED Final   Sapovirus (I, II, IV, and V) NOT DETECTED NOT DETECTED Final    Comment: Performed at [facility], [address], [place], [place] [address]      Studies: DG Abd 1  View Result Date: 04/28/2024 CLINICAL DATA:  Abdominal distension.  Ileus. EXAM: ABDOMEN - 1 VIEW COMPARISON:  Radiograph 04/26/2024, CT 04/21/2024 FINDINGS: Diffuse colonic distension, without significant change. Decreasing gaseous small bowel distension centrally. There is excreted IV contrast in the renal collecting systems and ureters. No obvious free air. IMPRESSION: Diffuse colonic distension, without significant change. Decreasing gaseous small bowel distension centrally. Electronically Signed   By: Andrea Gasman M.D.   On: 04/28/2024 13:12   CT Angio Chest Pulmonary Embolism (PE) W or WO Contrast Result Date: 04/28/2024 EXAM: CTA of the Chest with contrast for PE 04/28/2024 12:16:08 PM TECHNIQUE: CTA of the chest was performed after the administration of 75 mL iohexol  (OMNIPAQUE ) 350 MG/ML injection. Multiplanar reformatted images are provided for review. MIP images are provided for review. Automated exposure control, iterative reconstruction, and/or weight based adjustment of the mA/kV was utilized to reduce the radiation dose to as low as reasonably achievable. COMPARISON: 01/22/2024 CLINICAL HISTORY: Pulmonary embolism (PE) suspected, low to intermediate probability, positive D-dimer; Rule out pneumonia. FINDINGS: PULMONARY ARTERIES: Pulmonary arteries are adequately opacified for evaluation. No filling defect in the pulmonary arterial tree to suggest pulmonary embolus. Main pulmonary artery is normal in caliber. MEDIASTINUM: Prominent cardiomegaly. Thoracic aortic, coronary artery, and branch vessel atheromatous vascular calcifications. Stable hypodense thyroid  nodules up to 1.4 cm in long axis. No further imaging workup is indicated. LYMPH NODES: No mediastinal, hilar or axillary lymphadenopathy. LUNGS AND PLEURA: Mucus in the right mainstem bronchus. New and increased patch of ground glass opacities in both lungs with band-like components in the lobes of the right lung and in the left upper lobe  volume loss most striking in the right lower lobe. Mild airway thickening noted bilaterally. The ground glass opacities are mostly confined to secondary pulmonary lobules or combination of secondary pulmonary lobules. Differential diagnostic considerations for this include atypical pneumonia, acute eosinophilic pneumonia, acute hypersensitivity pneumonitis, pulmonary edema, or pulmonary hemorrhage. No pleural effusion or pneumothorax. UPPER ABDOMEN: Osseous distention of colon in the upper abdomen with air-fluid level. SOFT TISSUES AND BONES: Bilateral gynecomastia. Severe right and moderate left degenerative glenohumeral arthropathy. IMPRESSION: 1. No evidence of pulmonary embolism. 2. New and increased bilateral ground glass opacities with band-like components, associated volume loss greatest in the right lower lobe, and  mild bilateral airway thickening, which may reflect atypical pneumonia, acute eosinophilic pneumonia, acute hypersensitivity pneumonitis, pulmonary edema, or pulmonary hemorrhage. 3. Severe right and moderate left glenohumeral degenerative arthropathy. 4. Several additional chronic and incidental findings are present, including stable thyroid  nodules up to 1.4 cm without further imaging recommended, atherosclerotic calcifications of the thoracic aorta and coronary and branch vessels, cardiomegaly, mucus in the right mainstem bronchus, bilateral gynecomastia, and colonic distention with air-fluid level. Electronically signed by: Ryan Salvage MD 04/28/2024 12:51 PM EST RP Workstation: [id number]     Scheduled Meds:  azithromycin   500 mg Oral Daily   budesonide  (PULMICORT ) nebulizer solution  0.25 mg Nebulization BID   Chlorhexidine  Gluconate Cloth  6 each Topical Daily   clopidogrel   75 mg Oral Daily   feeding supplement  1 Container Oral TID BM   fiber supplement (BANATROL TF)  60 mL Oral BID   furosemide   40 mg Oral Daily   insulin  aspart  0-6 Units Subcutaneous Q4H    ipratropium-albuterol   3 mL Nebulization Q6H   lidocaine   1 Application Urethral Once   methylnaltrexone   8 mg Subcutaneous Q24H   pantoprazole  (PROTONIX ) IV  40 mg Intravenous Q24H   polyethylene glycol  17 g Oral TID BM   revefenacin   175 mcg Nebulization Daily   rosuvastatin   20 mg Oral Daily    Continuous Infusions:  amiodarone  30 mg/hr (04/28/24 1620)   ceFEPime  (MAXIPIME ) IV 2 g (04/28/24 1618)   heparin  Stopped (04/27/24 2118)     LOS: 6 days     Lebron JINNY Cage, MD [facility]  If 7PM-7AM, please contact night-coverage [email or web address] 04/28/2024, 4:37 PM

## 2024-04-28 NOTE — Progress Notes (Signed)
 PT Cancellation Note  Patient Details Name: Chase Combs MRN: [id number] DOB: 05-Sep-1945   Cancelled Treatment:    Reason Eval/Treat Not Completed: (P) Medical issues which prohibited therapy (RN defer, pt not medically stable to participate today, also off unit for abdominal imaging/work-up. Note pending palliative consult.) Will continue efforts per PT plan of care next date or Monday as schedule permits once medically appropriate.   Chase Combs Chase Combs 04/28/2024, 11:05 AM

## 2024-04-28 NOTE — Consult Note (Signed)
 NAME:  Chase Combs, MRN:  [id number], DOB:  [birth year], LOS: 6 ADMISSION DATE:  04/21/2024, CONSULTATION DATE:  12/5 REFERRING MD:  Donnamarie, CHIEF COMPLAINT:  resp failure    History of Present Illness:  78 year old male patient with extensive medical history including ischemic cardiomyopathy with EF 20 to 25%, chronic atrial fibrillation, coronary artery disease, atrial fibrillation, and recurrent acute colonic pseudo obstruction Presented to the emergency room on 11/29 with episode of presyncope after 1 to 2 days of nausea vomiting and diarrhea.  In ER initially found to be hypotensive, responding initially to IV fluid hydration, had acute on chronic renal failure, and CT of abdomen and pelvis showed diffuse mild dilation of the colon without a transition point suggesting colitis versus ileus.  Was admitted to the medical team, gastroenterology services were consulted for progressive ileus/Ogilvie syndrome, who also felt likely the recurrent pseudoobstruction may also be exacerbated by chronic low flow state from his congestive heart failure.  Cardiology was consulted for ongoing atrial fibrillation initially.  Therapeutic interventions in regards to the Ogilvie syndrome has included initially IV hydration, antiemetics, and tapwater enemas.  From a cardiology standpoint his paroxysmal atrial fibrillation was treated with IV amiodarone .  On 12/3 noted to have increased abdominal tightness Worsening abdominal discomfort.  Mild increased need and FiO2.  On 12/4 began to report increased shortness of breath, and simply said just felt worn out, noted to be a bit more lethargic compared to prior exams.  IV fluids were discontinued on 12/5 continued to have significant lethargy, ongoing oxygen need, and worsening work of breathing and therefore a CT angiogram was obtained this demonstrated diffuse bilateral patchy airspace disease thickened esophagus and mucus in the right mainstem bronchus with increased  colonic distention and air-fluid level.  Because of ongoing hypoxia and CT findings pulmonary was asked to evaluate  Pertinent  Medical History  Ischemic cardiomyopathy with a EF 20 to 25%, coronary artery disease with prior stenting to the LAD in August 2025, atrial fibrillation, hyperlipidemia, hypertension type 2 diabetes, prostate cancer status post radiation 2023, recurrent acute colonic pseudoobstruction  Significant Hospital Events: Including procedures, antibiotic start and stop dates in addition to other pertinent events   Admitted 04/22/2024 PCCM consulted 12.05 for hypoxia   Interim History / Subjective:  Reports mild shortness of breath Denies aspiration Reports abd tenderness when examined   Objective    Blood pressure 106/62, pulse (!) 110, temperature 97.6 F (36.4 C), temperature source Oral, resp. rate 20, height 6' (1.829 m), weight 88 kg, SpO2 97%.        Intake/Output Summary (Last 24 hours) at 04/28/2024 1716 Last data filed at 04/28/2024 1621 Gross per 24 hour  Intake 2289.4 ml  Output 325 ml  Net 1964.4 ml   Filed Weights   04/26/24 0535 04/27/24 0623 04/28/24 0401  Weight: 89 kg 90.3 kg 88 kg    Examination: General: sleeping but wakes up to voice HENT: normal exam Lungs: scattered rales Cardiovascular: S1,S2 Abdomen: distended Extremities: mild edema Neuro: oriented   Resolved problem list   Assessment and Plan   Acute hypoxic respiratory failure from multifocal pneumonia Acute colonic pseudo-obstruction Fall Ischemic cardiomyopathy with EF 20-25%  ABG reviewed Ct chest shows new GGO b/l and mucous plug in right mainstem. Has some scarring visible on prior ct abd in lower lobes but not as prominent then Broad differentials- aspiration pneumonia/pneumonitis, viral pneumonia, less likely pulm edema, other rare etiologies like eosinophilic pneumonia, HP, pulm hemorrhage, etc Will  check viral swab, sputum culture Given new oxygen  requirement, agree with empiric antibiotics,. Currently on cefepime  and azithromycin . Can continue the same or change to zosyn  or levoquin as less likely for this to be atypical community acquired infections at this point, check MRSA nares Aspiration precautions  Goals of care ongoing   Labs   CBC: Recent Labs  Lab 04/22/24 0331 04/23/24 0223 04/25/24 2339 04/27/24 0353 04/28/24 0016  WBC 4.7 5.0 8.0 7.9 8.2  NEUTROABS 2.9 2.9 5.6 5.7 6.2  HGB 11.1* 12.4* 12.6* 11.9* 12.2*  HCT 33.3* 38.2* 39.7 37.1* 37.7*  MCV 90.2 91.6 92.8 92.8 91.3  PLT 309 307 378 365 373    Basic Metabolic Panel: Recent Labs  Lab 04/22/24 1351 04/23/24 0223 04/23/24 1335 04/24/24 0653 04/24/24 1756 04/25/24 0535 04/25/24 0745 04/25/24 1916 04/25/24 2339 04/26/24 0734 04/27/24 0353 04/28/24 0016  NA 135 132*   < > 134*   < >  --    < > 137 136 138 138 138  K 3.4* 3.0*   < > 2.9*   < >  --    < > 3.8 4.3 3.9 4.2 3.7  CL 101 100   < > 103   < >  --    < > 108 110 107 107 105  CO2 19* 18*   < > 20*   < >  --    < > 21* 22 22 24 23   GLUCOSE 104* 109*   < > 88   < >  --    < > 135* 169* 157* 134* 110*  BUN 59* 68*   < > 70*   < >  --    < > 43* 45* 44* 41* 34*  CREATININE 2.05* 1.85*   < > 1.53*   < >  --    < > 1.04 1.09 1.10 1.03 0.96  CALCIUM  8.5* 8.5*   < > 8.2*   < >  --    < > 9.0 8.4* 8.7* 8.3* 8.2*  MG 2.0 2.0  --  2.1  --  2.2  --   --  2.3  --   --   --    < > = values in this interval not displayed.   GFR: Estimated Creatinine Clearance: 71.9 mL/min (by C-G formula based on SCr of 0.96 mg/dL). Recent Labs  Lab 04/21/24 2240 04/22/24 0331 04/22/24 0607 04/22/24 0843 04/23/24 0223 04/25/24 2339 04/26/24 0219 04/27/24 0353 04/27/24 1344 04/28/24 0016  PROCALCITON  --   --  8.67  --   --   --   --   --  0.26  --   WBC  --    < >  --   --  5.0 8.0  --  7.9  --  8.2  LATICACIDVEN 1.2  --   --  1.6  --  1.2 1.0  --   --   --    < > = values in this interval not displayed.     Liver Function Tests: Recent Labs  Lab 04/21/24 1951 04/22/24 0331 04/23/24 0223  AST 22 27 52*  ALT 20 25 66*  ALKPHOS 86 54 60  BILITOT 0.8 0.7 0.5  PROT 7.3 5.6* 5.7*  ALBUMIN 4.1 2.7* 2.6*   No results for input(s): LIPASE, AMYLASE in the last 168 hours. No results for input(s): AMMONIA in the last 168 hours.  ABG    Component Value Date/Time   PHART 7.37 04/27/2024  1253   PCO2ART 40 04/27/2024 1253   PO2ART 75 (L) 04/27/2024 1253   HCO3 23.2 04/27/2024 1253   TCO2 34 (H) 01/22/2024 1259   ACIDBASEDEF 2.2 (H) 04/27/2024 1253   O2SAT 98.1 04/27/2024 1253     Coagulation Profile: Recent Labs  Lab 04/21/24 1951  INR 1.7*    Cardiac Enzymes: No results for input(s): CKTOTAL, CKMB, CKMBINDEX, TROPONINI in the last 168 hours.  HbA1C: Hgb A1c MFr Bld  Date/Time Value Ref Range Status  04/22/2024 06:07 AM 5.5 4.8 - 5.6 % Final    Comment:    (NOTE)         Prediabetes: 5.7 - 6.4         Diabetes: >6.4         Glycemic control for adults with diabetes: <7.0   09/22/2023 08:46 AM 6.4 (H) 4.8 - 5.6 % Final    Comment:    (NOTE) Pre diabetes:          5.7%-6.4%  Diabetes:              >6.4%  Glycemic control for   <7.0% adults with diabetes     CBG: Recent Labs  Lab 04/27/24 2327 04/28/24 0358 04/28/24 0840 04/28/24 1357 04/28/24 1557  GLUCAP 116* 108* 138* 122* 113*   Ct chest 04/28/2024 Mucus in the right mainstem bronchus. New and increased patch of ground glass opacities in both lungs with band-like components in the lobes of the right lung and in the left upper lobe volume loss most striking in the right lower lobe. Mild airway thickening noted bilaterally.  Review of Systems:   Denies cough, wheezing Abd pain  Past Medical History:  He,  has a past medical history of Abdominal hernia, Arthritis, Benign localized prostatic hyperplasia with lower urinary tract symptoms (LUTS), CAD (coronary artery disease) (05/2004),  Dyslipidemia, Essential hypertension, Heart murmur, History of COVID-19 (05/2020), History of ST elevation myocardial infarction (STEMI) (02/16/2009), History of urinary retention, 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(HCC) (04/2021), OSA (obstructive sleep apnea), PONV (postoperative nausea and vomiting), Pre-diabetes, S/p bare metal coronary artery stent, and Status post total knee replacement, 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   Surgical History:   Past Surgical History:  Procedure Laterality Date   BOWEL DECOMPRESSION N/A 02/18/2024   Procedure: DECOMPRESSION, INTESTINE;  Surgeon: Albertus Gordy HERO, MD;  Location: [facility] ENDOSCOPY;  Service: Gastroenterology;  Laterality: N/A;   CARDIAC CATHETERIZATION  05/27/2004   outpatient by dr burnard [facility] cardiology) for positive stress test;  80% stensis pRI   CARDIOVERSION N/A 02/21/2024   Procedure: CARDIOVERSION;  Surgeon: Cherrie Toribio SAUNDERS, MD;  Location: [facility] INVASIVE CV LAB;  Service: Cardiovascular;  Laterality: N/A;   COLONOSCOPY N/A 02/18/2024   Procedure: COLONOSCOPY;  Surgeon: Albertus Gordy HERO, MD;  Location: [facility] ENDOSCOPY;  Service: Gastroenterology;  Laterality: N/A;   CORONARY ANGIOPLASTY WITH STENT PLACEMENT  06/18/2004   @[facility]  by Dr Lavon;  PCI w/ BMS to pRI  (CoStar study stent - 2.5 x 16)   CORONARY ANGIOPLASTY WITH STENT PLACEMENT  02/16/2009   @[facility]  by dr verlin;   Inferior STEMI:  PCI w/ thrombectomy total occluded RCA, BMS x1 to pRCA (Vision BMS 3.5 x 23 -> 4.0 mm), residual 40-50% pLAD, ef 45-50%   CORONARY PRESSURE/FFR STUDY N/A 01/17/2024   Procedure: CORONARY PRESSURE/FFR STUDY;  Surgeon: Mady Bruckner, MD;  Location: [facility] INVASIVE CV LAB;  Service: Cardiovascular;  Laterality: N/A;   CORONARY STENT INTERVENTION N/A 01/17/2024   Procedure: CORONARY STENT INTERVENTION;  Surgeon: Mady Bruckner, MD;  Location: [facility] INVASIVE CV LAB;  Service: Cardiovascular;  Laterality: N/A;   CORONARY ULTRASOUND/IVUS N/A 01/17/2024   Procedure: Coronary Ultrasound/IVUS;   Surgeon: Mady Bruckner, MD;  Location: [facility] INVASIVE CV LAB;  Service: Cardiovascular;  Laterality: N/A;   CORONARY/GRAFT ACUTE MI REVASCULARIZATION N/A 02/01/2024   Procedure: Coronary/Graft Acute MI Revascularization;  Surgeon: Elmira Newman PARAS, MD;  Location: [facility] INVASIVE CV LAB;  Service: Cardiovascular;  Laterality: N/A;   ESOPHAGOGASTRODUODENOSCOPY N/A 02/02/2024   Procedure: EGD (ESOPHAGOGASTRODUODENOSCOPY);  Surgeon: Nandigam, Kavitha V, MD;  Location: [facility] ENDOSCOPY;  Service: Gastroenterology;  Laterality: N/A;   FLEXIBLE SIGMOIDOSCOPY N/A 10/20/2023   Procedure: KINGSTON SIDE;  Surgeon: Legrand Victory LITTIE DOUGLAS, MD;  Location: [facility] ENDOSCOPY;  Service: Gastroenterology;  Laterality: N/A;   FLEXIBLE SIGMOIDOSCOPY N/A 02/02/2024   Procedure: SIGMOIDOSCOPY, FLEXIBLE;  Surgeon: Shila Gustav GAILS, MD;  Location: [facility] ENDOSCOPY;  Service: Gastroenterology;  Laterality: N/A;   GOLD SEED IMPLANT N/A 09/19/2021   Procedure: GOLD SEED IMPLANT;  Surgeon: Cam Morene ORN, MD;  Location: [facility];  Service: Urology;  Laterality: N/A;  ONLY NEEDS 30 MIN FOR ALL   INGUINAL HERNIA REPAIR Right 09/02/1999   @[facility] ;   recurrent repair right inguinal hernia;   previous repair approx 1990s   INGUINAL HERNIA REPAIR Left    1990s   KNEE ARTHROSCOPY W/ MENISCAL REPAIR Right 12/18/2005   @[facility]    LUMBAR DISC SURGERY  1982   L5--S1   RIGHT/LEFT HEART CATH AND CORONARY ANGIOGRAPHY N/A 01/17/2024   Procedure: RIGHT/LEFT HEART CATH AND CORONARY ANGIOGRAPHY;  Surgeon: Mady Bruckner, MD;  Location: [facility] INVASIVE CV LAB;  Service: Cardiovascular;  Laterality: N/A;   SPACE OAR INSTILLATION N/A 09/19/2021   Procedure: SPACE OAR INSTILLATION;  Surgeon: Cam Morene ORN, MD;  Location: [facility];  Service: Urology;  Laterality: N/A;   TOTAL KNEE ARTHROPLASTY Left 10/01/2023   Procedure: ARTHROPLASTY, KNEE, TOTAL;  Surgeon: Kay Kemps, MD;  Location: [facility] ORS;  Service: Orthopedics;   Laterality: Left;   TRANSESOPHAGEAL ECHOCARDIOGRAM (CATH LAB) N/A 02/21/2024   Procedure: TRANSESOPHAGEAL ECHOCARDIOGRAM;  Surgeon: Cherrie Toribio SAUNDERS, MD;  Location: [facility] INVASIVE CV LAB;  Service: Cardiovascular;  Laterality: N/A;     Social History:   reports that he has never smoked. He has never used smokeless tobacco. He reports that he does not drink alcohol and does not use drugs.   Family History:  His family history includes Lung cancer in his mother. There is no history of Colon cancer or Esophageal cancer.   Allergies Allergies  Allergen Reactions   Codeine Nausea And Vomiting   Latex Rash    Oral blisters when used at dentist     Home Medications  Prior to Admission medications   Medication Sig Start Date End Date Taking? Authorizing Provider  amiodarone  (PACERONE ) 200 MG tablet Take 1 tablet (200 mg total) by mouth daily. 04/07/24  Yes Milford, Harlene HERO, FNP  apixaban  (ELIQUIS ) 5 MG TABS tablet Take 1 tablet (5 mg total) by mouth 2 (two) times daily. 03/23/24  Yes Colletta Manuelita Garre, PA-C  CALCIUM  PO Take 500 mg by mouth daily.   Yes [provider]  clopidogrel  (PLAVIX ) 75 MG tablet Take 1 tablet (75 mg total) by mouth daily. 03/23/24  Yes Colletta Manuelita Garre, PA-C  cyanocobalamin  (VITAMIN B12) 1000 MCG tablet Take 1,000 mcg by mouth daily.   Yes [provider]  digoxin  (LANOXIN ) 0.125 MG tablet Take 1/2 tablet (0.0625 mg total)  by mouth daily. 03/23/24  Yes Colletta Manuelita Garre, PA-C  empagliflozin  (JARDIANCE ) 10 MG TABS tablet Take 1 tablet (10 mg total) by mouth daily before breakfast. 03/10/24  Yes Lee, Jordan, NP  furosemide  (LASIX ) 40 MG tablet Take 1 tablet (40 mg total) by mouth 2 (two) times daily. 04/07/24 07/06/24 Yes Milford, Harlene HERO, FNP  hydrOXYzine (ATARAX) 25 MG tablet Take 12.5-25 mg by mouth at bedtime as needed for anxiety. 03/27/24  Yes [provider]  Multiple Vitamins-Minerals (MULTIVITAMIN MEN 50+) TABS Take 1  tablet by mouth daily.   Yes [provider]  nitroGLYCERIN  (NITROSTAT ) 0.4 MG SL tablet Place 0.4 mg under the tongue every 5 (five) minutes as needed for chest pain. 01/19/24  Yes [provider]  ondansetron  (ZOFRAN ) 8 MG tablet Take 8 mg by mouth every 8 (eight) hours as needed for nausea or vomiting. 02/29/24  Yes [provider]  pantoprazole  (PROTONIX ) 40 MG tablet Take 1 tablet (40 mg total) by mouth daily. 02/24/24  Yes Arrien, Elidia Sieving, MD  potassium chloride  SA (KLOR-CON  M) 20 MEQ tablet Take 1 tablet (20 mEq total) by mouth 2 (two) times daily. 04/07/24  Yes Milford, Harlene HERO, FNP  Probiotic Product (PROBIOTIC PO) Take 1,000 mg by mouth daily. Chew   Yes [provider]  rosuvastatin  (CRESTOR ) 20 MG tablet TAKE 1 TABLET(20 MG) BY MOUTH DAILY 11/29/23  Yes Anner Alm ORN, MD  sacubitril -valsartan  (ENTRESTO ) 24-26 MG Take 1 tablet by mouth 2 (two) times daily. 04/07/24  Yes Milford, Harlene HERO, FNP  sertraline (ZOLOFT) 50 MG tablet Take 50 mg by mouth daily.   Yes [provider]  spironolactone  (ALDACTONE ) 25 MG tablet Take 0.5 tablets (12.5 mg total) by mouth daily. 03/14/24 06/12/24 Yes Milford, Harlene HERO, FNP  tamsulosin  (FLOMAX ) 0.4 MG CAPS capsule Take 1 capsule (0.4 mg total) by mouth daily after supper. 11/17/21  Yes Patrcia Cough, MD     Critical care time: n/a

## 2024-04-28 NOTE — Progress Notes (Signed)
 PHARMACY - ANTICOAGULATION CONSULT NOTE  Pharmacy Consult for Heparin  (holding Apixaban ) Indication: atrial fibrillation  Allergies  Allergen Reactions   Codeine Nausea And Vomiting   Latex Rash    Oral blisters when used at dentist    Patient Measurements: Height: 6' (182.9 cm) Weight: 88 kg (194 lb 0.1 oz) IBW/kg (Calculated) : 77.6 HEPARIN  DW (KG): 85  Vital Signs: Temp: 98.1 F (36.7 C) (12/05 0800) Temp Source: Oral (12/05 0800) BP: 103/68 (12/05 0800) Pulse Rate: 98 (12/05 0800)  Labs: Recent Labs    04/25/24 2339 04/26/24 0219 04/26/24 0734 04/26/24 1820 04/27/24 0353 04/28/24 0016  HGB 12.6*  --   --   --  11.9* 12.2*  HCT 39.7  --   --   --  37.1* 37.7*  PLT 378  --   --   --  365 373  APTT  --   --   --  63* 88*  --   HEPARINUNFRC  --   --   --  >1.10* >1.10*  --   CREATININE 1.09  --  1.10  --  1.03 0.96  TROPONINIHS 39* 38*  --   --   --   --     Estimated Creatinine Clearance: 71.9 mL/min (by C-G formula based on SCr of 0.96 mg/dL).  Assessment: 78 y/o M on with increasingly distended abdomen, on apixaban  PTA for afib, holding apixaban  and starting heparin  in case any procedures are needed, last dose of apixaban  was 12/2 around 1930. Will start heparin  this morning at 0730. Anticipate using aPTT to dose for now.   Heparin  resumed this AM after being held for hematuria   Goal of Therapy:  Heparin  level 0.3-0.7 units/ml aPTT 66-102 seconds Monitor platelets by anticoagulation protocol: Yes   Plan:  Continue heparin  gtt at 1300 units/hr Monitor with aPTTs until correlates with heparin  level CBC daily  Thank you. Olam Monte, PharmD

## 2024-04-28 NOTE — Progress Notes (Addendum)
 Patient ID: Chase Combs, male   DOB: August 04, 1945, 78 y.o.   MRN: [id number]    Progress Note   Subjective   Day # 6 CC; recurrent diffuse ileus in patient with severe ischemic cardiomyopathy, acute on chronic congestive heart failure, atrial for flutter  Patient had an episode of overt hematuria during the night, has not recurred thus far this morning Patient more alert and more comfortable appearing than when seen yesterday.  Seems less short of breath He feels his abdomen is somewhat softer denies abdominal pain, he does not have any appetite at all and really has not been eating much of anything all week  Labs today-WBC 8.2/hemoglobin 12.2/hematocrit 37.7 Sodium 138/potassium 3.7 creatinine 0.96  CT angio chest -ordered, not done    Objective   Vital signs in last 24 hours: Temp:  [97.5 F (36.4 C)-98.1 F (36.7 C)] 98.1 F (36.7 C) (12/05 0800) Pulse Rate:  [85-120] 98 (12/05 0800) Resp:  [20-27] 20 (12/05 0800) BP: (100-121)/(66-87) 103/68 (12/05 0800) SpO2:  [95 %-98 %] 95 % (12/05 0800) Weight:  [88 kg] 88 kg (12/05 0401) Last BM Date : 04/26/24 General:    Frail appearing elderly white male in NAD, appears more comfortable than yesterday less short of breath and more communicative Heart:  irRegular rate and rhythm; no murmurs Lungs: Respirations even, some increased work of breathing improved over yesterday, scattered Rales Abdomen: Large protuberant, stented, bowel sounds are positive, remains tympanitic no focal tenderness Extremities:  Without edema. Neurologic:  Alert and oriented,  grossly normal neurologically. Psych:  Cooperative. Normal mood and affect.  Intake/Output from previous day: 12/04 0701 - 12/05 0700 In: 2016.4 [P.O.:597; I.V.:1419.4] Out: 1150 [Urine:1150] Intake/Output this shift: No intake/output data recorded.  Lab Results: Recent Labs    04/25/24 2339 04/27/24 0353 04/28/24 0016  WBC 8.0 7.9 8.2  HGB 12.6* 11.9* 12.2*  HCT 39.7  37.1* 37.7*  PLT 378 365 373   BMET Recent Labs    04/26/24 0734 04/27/24 0353 04/28/24 0016  NA 138 138 138  K 3.9 4.2 3.7  CL 107 107 105  CO2 22 24 23   GLUCOSE 157* 134* 110*  BUN 44* 41* 34*  CREATININE 1.10 1.03 0.96  CALCIUM  8.7* 8.3* 8.2*   LFT No results for input(s): PROT, ALBUMIN, AST, ALT, ALKPHOS, BILITOT, BILIDIR, IBILI in the last 72 hours. PT/INR No results for input(s): LABPROT, INR in the last 72 hours.  Studies/Results: DG CHEST PORT 1 VIEW Result Date: 04/27/2024 EXAM: 1 VIEW(S) XRAY OF THE CHEST 04/27/2024 01:29:11 PM COMPARISON: 04/25/2024 CLINICAL HISTORY: Dyspnea FINDINGS: LUNGS AND PLEURA: Low lung volumes. Diffuse interstitial and airspace opacities. Elevated right hemidiaphragm. No pleural effusion. No pneumothorax. HEART AND MEDIASTINUM: No acute abnormality of the cardiac and mediastinal silhouettes. BONES AND SOFT TISSUES: Degenerative changes of right shoulder. IMPRESSION: 1. Unchanged interstitial and airspace opacities. 2. Low lung volumes. 3. Elevated right hemidiaphragm. Electronically signed by: Franky Stanford MD 04/27/2024 08:07 PM EST RP Workstation: [id number]       Assessment / Plan:    #20 78 year old white with severe ischemic cardiomyopathy/EF 20 to 25%, coronary artery disease status post MI 12/2023 and new LAD stent.  Complicated course postop including ischemic colitis and prolonged ileus.  Also with chronic kidney disease diabetes mellitus sleep apnea and history of prostate cancer. Failed Relistor  and neostigmine  and eventually improved after colonic decompression  Admitted 6 days ago with presyncope and hypotension after being ill at home with nausea vomiting abdominal cramping  and diarrhea and then associated recurrent abdominal distention  CT on admit with diffuse colonic dilation and some dilated loops of ileum Abdominal films have been stable since not -critically dilated.  Suspect chronic low flow state due  to his severe cardiomyopathy contributing to recurrent ileus.  He has been doing poorly this admission with acute on chronic congestive heart failure, and is felt to be extremely high risk for complications with any sedation and we are trying to avoid colonic decompression  Do not feel that he will tolerate neostigmine   Plan; continue full liquid diet with supplements, patient does not want to advance his diet. If we are going to continue with aggressive management then he needs some form of alternative nutrition  Palliative care to see today Continue MiraLAX  3 times daily Will add Relistor  today and tomorrow 8 mg Tapwater enema later in the day to vent: Repeat abdominal films       Principal Problem:   Enterocolitis Active Problems:   HLD (hyperlipidemia)   Postural dizziness with presyncope   Hypokalemia   Chronic systolic CHF (congestive heart failure) (HCC)   Ogilvie syndrome   Ileus, unspecified (HCC)   Acute kidney injury   DM2 (diabetes mellitus, type 2) (HCC)   Elevated troponin   Fall at home, initial encounter   Hypotension   Paroxysmal atrial flutter (HCC)   Anemia of chronic disease     LOS: 6 days   Amy EsterwoodPA-C  04/28/2024, 10:50 AM   Attending physician's note   I personally saw the patient and performed a substantive portion of the medical decision making process for this encounter (including a complete performance of the key components : MDM, Hx and Exam), in conjunction with the APP.  I agree with the APP's note, impression, and  the management plan for the number and complexity of problems addressed at the encounter for the patient and take responsibility for that plan with its inherent risk of complications, morbidity, or mortality with additional input as follows.     Severe ischemic cardiomyopathy, congestive heart failure Pseudo colonic obstruction with chronic ileus  Abdomen is distended but slightly better compared to past few days,  remains tympanic, no tenderness or rebound  Continue bowel regimen with MiraLAX  1 capful 3 times daily Continue daily Relistor  Tapwater enema as needed daily  No plan for repeat colonoscopy for colonic decompression at this point  I have spent >35 minutes of patient care (this includes precharting, chart review, review of results, face-to-face time used for counseling as well as treatment plan and follow-up. The patient was provided an opportunity to ask questions and all were answered. The patient agreed with the plan and demonstrated an understanding of the instructions.   LOIS Wilkie Mcgee , MD [phone]

## 2024-04-28 NOTE — Progress Notes (Signed)
 PHARMACY - ANTICOAGULATION CONSULT NOTE  Pharmacy Consult for Heparin  (holding Apixaban ) Indication: atrial fibrillation  Allergies  Allergen Reactions   Codeine Nausea And Vomiting   Latex Rash    Oral blisters when used at dentist    Patient Measurements: Height: 6' (182.9 cm) Weight: 88 kg (194 lb 0.1 oz) IBW/kg (Calculated) : 77.6 HEPARIN  DW (KG): 85  Vital Signs: Temp: 97.6 F (36.4 C) (12/05 1600) Temp Source: Oral (12/05 1600) BP: 106/62 (12/05 1343) Pulse Rate: 110 (12/05 1343)  Labs: Recent Labs    04/25/24 2339 04/26/24 0219 04/26/24 0734 04/26/24 1820 04/27/24 0353 04/28/24 0016 04/28/24 1426  HGB 12.6*  --   --   --  11.9* 12.2*  --   HCT 39.7  --   --   --  37.1* 37.7*  --   PLT 378  --   --   --  365 373  --   APTT  --   --   --  63* 88*  --  41*  HEPARINUNFRC  --   --   --  >1.10* >1.10*  --  >1.10*  CREATININE 1.09  --  1.10  --  1.03 0.96  --   TROPONINIHS 39* 38*  --   --   --   --   --     Estimated Creatinine Clearance: 71.9 mL/min (by C-G formula based on SCr of 0.96 mg/dL).  Assessment: 78 y/o M on with increasingly distended abdomen, on apixaban  PTA for afib, holding apixaban  and starting heparin  in case any procedures are needed, last dose of apixaban  was 12/2 around 1930. Pharmacy consulted for heparin .     Heparin  level >1.1 is not correlating with APTT, which is 41 and subtherapeutic. Per RN, heparin  was never restarted this morning as hematuria (dark red) has continued and not improved. Per MD, hold heparin  given blood clot in renal/bladder ultrasound. Urology consulted  Goal of Therapy:  Heparin  level 0.3-0.7 units/ml aPTT 66-102 seconds Monitor platelets by anticoagulation protocol: Yes   Plan:  Hold heparin  gtt   F/u restart plan   Jinnie Door, PharmD, BCPS, BCCP Clinical Pharmacist  Please check AMION for all [facility] Pharmacy phone numbers After 10:00 PM, call Main Pharmacy [phone]

## 2024-04-28 NOTE — Consult Note (Signed)
 Palliative Care Consult Note                                  Date: 04/28/2024   Patient Name: Chase Combs  DOB:09/25/1945  FMW:[id number]  Age / Sex:78 y.o., male  PCP: Wonda Worth SQUIBB, PA Referring Physician: Donnamarie Lebron PARAS, MD  Reason for Consultation: Establishing goals of care  Past Medical History:  Diagnosis Date   Abdominal hernia    per pt   Arthritis    hands   Benign localized prostatic hyperplasia with lower urinary tract symptoms (LUTS)    CAD (coronary artery disease) 05/2004   cardiologist--- dr anner;  positive stress test , had outpt cardiac cath 05-27-2004 stenosis RI;  06-18-2004 cath w/ PCI and BMS x1 to pRI;   STEMI 02-16-2009  s/p cath w/ PCI thrombectomy for total occluded RCA , BMS x1 to pRCA;  nuclear stress test 03-15-2012 low risk no ischemia but sig inferior-inferolateral infarct , ef 51%   Dyslipidemia    Essential hypertension    Heart murmur    History of COVID-19 05/2020   per pt mild symptoms that resolved   History of ST elevation myocardial infarction (STEMI) 02/16/2009   inferior STEMI   cath w/ pci w/ BMS to pRCA   History of urinary retention    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[facility]) 04/2021   urologist--- dr borden/ radiation oncology-- dr patrcia;  dx 12/ 2022,  Gleason 4+5, PSA 24.6, volume 94.6cc;  plan to start IMRT   OSA (obstructive sleep apnea)    per pt dx approx 2008, no cpap intolerant   PONV (postoperative nausea and vomiting)    Pre-diabetes    S/p bare metal coronary artery stent    01/ 2006  x1 BMS to pRI (CoStar study stent);  and 09/ 2010  x1 BMS to pRCA   Status post total knee replacement, 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   Per H&P on 04/22/2024 by [facility] MD, patient presented after a couple days of N/V/D with decreased PO intake. Patient reported dizziness and lightheadedness resulting in a fall with head strike but no loss of consciousness.   Per cardiology progress note 04/28/2024 by Loni MD patient volume status elevated per redsclip (40%) with plans to reinitiate lasix .   Per GI progress note on 04/28/2024 by Nandigam MD interventions for acute colonic pseudo-obstruction limited by tenuous cardiac status without any current plans for repeat colonoscopy for decompression.   CT head on 04/21/24 negative for acute intracranial abnormality. CT abdomen on 11/28 demonstrated mild dilatation of colon with air-fluid levels with no transition point and distended loops and stomach. CT Angio Chest PE on 04/28/2024 negative for PE but increased bilateral GGO. TEE on 02/21/2024 showed EF 20-25%, LV severely decreased function and global hypokinesis, RV function moderately reduced.   Palliative medicine consulted for goals of care conversation.   SUMMARY OF RECOMMENDATIONS   DNR-limited Full scope, but family aware that options are limited by heart failure Continue current measures with hopes to improve enough to rehab Patient and family not ready for comfort measures at this time but aware of poor prognosis PMT will continue  to follow for continued goals of care conversation  Symptom Management:  Per primary team  Code Status: DNR - Limited (DNR/DNI)  Prognosis:  Unable to determine  Discharge Planning:  To Be Determined   Discussed with: Ezenduka MD on 04/28/2024 about patient and family goals of continuing current measures with hopes of rehab, but family aware that prognosis is poor.   Subjective:   Reviewed medical records, received report from team, assessed the patient and then meet at the patient's bedside to discuss diagnosis, prognosis, GOC, EOL wishes disposition and  options.  I met with patient at bedside initially and spoke with wife Mel) by phone. Met with patient, wife, and daughter Clabe) a second time to discuss new imaging results.    We meet to discuss diagnosis prognosis, GOC, EOL wishes, disposition and options. Concept of Palliative Care was introduced as specialized medical care for people and their families living with serious illness.  If focuses on providing relief from the symptoms and stress of a serious illness.  The goal is to improve quality of life for both the patient and the family. Values and goals of care important to patient and family were attempted to be elicited.  Created space and opportunity for patient  and family to explore thoughts and feelings regarding current medical situation   Natural trajectory and current clinical status were discussed. Questions and concerns addressed. Patient encouraged to call with questions or concerns.    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 - Recently retired this year with hopes to enjoy retirement but has been hospitalized many times since retirement and knee replacement (09/2023)  Baseline Status: - Patient reported that after he was previously discharged 02/2024, he was doing well with home PT/OT and that up until this admission he was working on a deck at home - Lives at home with wife and was fairly active and able to carry out tasks he wanted  Today's Discussion: - Discussed with patient and family about the patient's health prior to admission, current admission and progress, and possible pathways going forward - Patient and family know that he is in a tenuous  state right now with limited aggressive interventions for his heart and obstruction  - Wife shares that she understands that the current situation is not good but she expressed that the patient presented similarly 01/2024 and was able to recover enough to return home with rehab and is hopeful that the patient will have a similar outcome - Shared with wife that although that the medical team also hopes for improvement as well, that the family may need to consider other outcomes and pathways if the patient does not improve enough to participate in rehab - Wife inquired about what rehab would look like in a skilled nursing facility vs home health and it was shared with her that SNF rehab would be more robust compared to home PT/OT with 4-5 sessions a week compared to home health with consisted of about 2 sessions a week - Briefly discussed hospice if patient does not improve enough to rehab, reassured wife that certainly it is not a decision that needs to be made now, but to be aware it  may be a topic to revisit if progress is not made - Revisited patient, wife, and daughter after they requested another meeting to discuss new CT PE and shared with them that the patient does not have a PE, but findings were concerning for pneumonia but  will defer management to primary team - Daughter inquired about renal US  and it was shared that the imaging result was not back yet but are aware that the procedure was complete  Review of Systems  Constitutional:  Positive for activity change, appetite change and fatigue.  Gastrointestinal:  Positive for abdominal distention.  Genitourinary:  Positive for dysuria and hematuria.    Objective:   Primary Diagnoses: Present on Admission:  Enterocolitis  Hypokalemia  Acute kidney injury  Elevated troponin  Hypotension  Chronic systolic CHF (congestive heart failure) (HCC)  Paroxysmal atrial flutter (HCC)  HLD (hyperlipidemia)  Anemia of chronic disease  Ogilvie  syndrome  Ileus, unspecified (HCC)   Vital Signs:  BP 106/62 (BP Location: Right Arm)   Pulse (!) 110   Temp 97.9 F (36.6 C) (Oral)   Resp (!) 21   Ht 6' (1.829 m)   Wt 88 kg   SpO2 94%   BMI 26.31 kg/m   Physical Exam Constitutional:      Comments: Somnolent but alert  HENT:     Head: Normocephalic.     Nose: Nose normal.     Mouth/Throat:     Mouth: Mucous membranes are dry.  Cardiovascular:     Rate and Rhythm: Tachycardia present.  Pulmonary:     Comments: Dyspnea with conversation Abdominal:     General: There is distension.  Neurological:     General: No focal deficit present.  Psychiatric:     Comments: depressed    Palliative Assessment/Data: 50%    Thank you for allowing us  to participate in the care of Chase Combs PMT will continue to support holistically.  I personally spent a total of 75 minutes in the care of the patient today including preparing to see the patient, getting/reviewing separately obtained history, performing a medically appropriate exam/evaluation, counseling and educating, referring and communicating with other health care professionals, and documenting clinical information in the EHR.  Signed by: [name] Palliative Medicine Team  Team Phone # [phone] (Nights/Weekends)  04/28/2024, 3:25 PM

## 2024-04-28 NOTE — Care Management Important Message (Signed)
 Important Message  Patient Details  Name: Chase Combs MRN: [id number] Date of Birth: 07/09/1945   Important Message Given:  Yes - Medicare IM     [name] 04/28/2024, 9:44 AM

## 2024-04-29 DIAGNOSIS — K529 Noninfective gastroenteritis and colitis, unspecified: Secondary | ICD-10-CM | POA: Diagnosis not present

## 2024-04-29 LAB — RESPIRATORY PANEL BY PCR

## 2024-04-29 LAB — CBC WITH DIFFERENTIAL/PLATELET
Basophils Absolute: 0 K/uL (ref 0.0–0.1)
Basophils Relative: 0 %
Eosinophils Absolute: 0.1 K/uL (ref 0.0–0.5)
Eosinophils Relative: 2 %
HCT: 32.7 % — ABNORMAL LOW (ref 39.0–52.0)
Hemoglobin: 10.9 g/dL — ABNORMAL LOW (ref 13.0–17.0)
Lymphocytes Relative: 4 %
Lymphs Abs: 0.2 K/uL — ABNORMAL LOW (ref 0.7–4.0)
MCH: 30 pg (ref 26.0–34.0)
MCHC: 33.3 g/dL (ref 30.0–36.0)
MCV: 90.1 fL (ref 80.0–100.0)
Monocytes Absolute: 0.1 K/uL (ref 0.1–1.0)
Monocytes Relative: 1 %
Neutro Abs: 5.6 K/uL (ref 1.7–7.7)
Neutrophils Relative %: 93 %
Platelets: 290 K/uL (ref 150–400)
RBC: 3.63 MIL/uL — ABNORMAL LOW (ref 4.22–5.81)
RDW: 19 % — ABNORMAL HIGH (ref 11.5–15.5)
WBC: 6 K/uL (ref 4.0–10.5)
nRBC: 0 % (ref 0.0–0.2)

## 2024-04-29 LAB — BASIC METABOLIC PANEL WITH GFR
Anion gap: 9 (ref 5–15)
Anion gap: 9 (ref 5–15)
BUN: 33 mg/dL — ABNORMAL HIGH (ref 8–23)
BUN: 29 mg/dL — ABNORMAL HIGH (ref 8–23)
CO2: 19 mmol/L — ABNORMAL LOW (ref 22–32)
CO2: 19 mmol/L — ABNORMAL LOW (ref 22–32)
Calcium: 7.9 mg/dL — ABNORMAL LOW (ref 8.9–10.3)
Calcium: 7.9 mg/dL — ABNORMAL LOW (ref 8.9–10.3)
Chloride: 106 mmol/L (ref 98–111)
Chloride: 107 mmol/L (ref 98–111)
Creatinine, Ser: 0.95 mg/dL (ref 0.61–1.24)
Creatinine, Ser: 0.89 mg/dL (ref 0.61–1.24)
GFR, Estimated: >60 mL/min (ref >60)
GFR, Estimated: >60 mL/min (ref >60)
Glucose, Bld: 149 mg/dL — ABNORMAL HIGH (ref 70–99)
Glucose, Bld: 143 mg/dL — ABNORMAL HIGH (ref 70–99)
Potassium: 2.6 mmol/L — CL (ref 3.5–5.1)
Potassium: 3.5 mmol/L (ref 3.5–5.1)
Sodium: 134 mmol/L — ABNORMAL LOW (ref 135–145)
Sodium: 135 mmol/L (ref 135–145)

## 2024-04-29 LAB — GLUCOSE, CAPILLARY
Glucose-Capillary: 113 mg/dL — ABNORMAL HIGH (ref 70–99)
Glucose-Capillary: 124 mg/dL — ABNORMAL HIGH (ref 70–99)
Glucose-Capillary: 129 mg/dL — ABNORMAL HIGH (ref 70–99)
Glucose-Capillary: 129 mg/dL — ABNORMAL HIGH (ref 70–99)
Glucose-Capillary: 131 mg/dL — ABNORMAL HIGH (ref 70–99)
Glucose-Capillary: 138 mg/dL — ABNORMAL HIGH (ref 70–99)
Glucose-Capillary: 148 mg/dL — ABNORMAL HIGH (ref 70–99)

## 2024-04-29 LAB — HEPARIN LEVEL (UNFRACTIONATED): Heparin Unfractionated: >1.1 [IU]/mL — ABNORMAL HIGH (ref 0.30–0.70)

## 2024-04-29 LAB — MRSA NEXT GEN BY PCR, NASAL: MRSA by PCR Next Gen: NOT DETECTED

## 2024-04-29 LAB — MAGNESIUM: Magnesium: 2 mg/dL (ref 1.7–2.4)

## 2024-04-29 LAB — APTT: aPTT: 46 s — ABNORMAL HIGH (ref 24–36)

## 2024-04-29 MED ORDER — POTASSIUM CHLORIDE 10 MEQ/100ML IV SOLN
10.0000 meq | INTRAVENOUS | Status: AC
  Administered 2024-04-29 (×3): 10 meq via INTRAVENOUS
  Filled 2024-04-29 (×3): qty 100

## 2024-04-29 MED ORDER — ACETYLCYSTEINE 20 % IN SOLN
2.0000 mL | Freq: Three times a day (TID) | RESPIRATORY_TRACT | Status: DC
  Administered 2024-04-30 – 2024-05-01 (×4): 2 mL via RESPIRATORY_TRACT
  Filled 2024-04-29 (×9): qty 4

## 2024-04-29 MED ORDER — IPRATROPIUM BROMIDE 0.02 % IN SOLN
0.5000 mg | Freq: Three times a day (TID) | RESPIRATORY_TRACT | Status: DC
  Administered 2024-04-29 – 2024-04-30 (×6): 0.5 mg via RESPIRATORY_TRACT
  Filled 2024-04-29 (×6): qty 2.5

## 2024-04-29 MED ORDER — POTASSIUM CHLORIDE 20 MEQ PO PACK
40.0000 meq | PACK | Freq: Once | ORAL | Status: AC
  Administered 2024-04-29: 40 meq via ORAL
  Filled 2024-04-29: qty 2

## 2024-04-29 MED ORDER — HEPARIN (PORCINE) 25000 UT/250ML-% IV SOLN
2000.0000 [IU]/h | INTRAVENOUS | Status: DC
  Administered 2024-04-29: 1300 [IU]/h via INTRAVENOUS
  Administered 2024-04-30: 1450 [IU]/h via INTRAVENOUS
  Administered 2024-04-30: 1700 [IU]/h via INTRAVENOUS
  Administered 2024-05-01 – 2024-05-03 (×4): 1850 [IU]/h via INTRAVENOUS
  Administered 2024-05-03: 1900 [IU]/h via INTRAVENOUS
  Administered 2024-05-04 – 2024-05-06 (×3): 2000 [IU]/h via INTRAVENOUS
  Filled 2024-04-29 (×13): qty 250

## 2024-04-29 NOTE — Progress Notes (Signed)
 PHARMACY - ANTICOAGULATION CONSULT NOTE  Pharmacy Consult for Heparin  (holding Apixaban ) Indication: atrial fibrillation  Allergies  Allergen Reactions   Codeine Nausea And Vomiting   Latex Rash    Oral blisters when used at dentist    Patient Measurements: Height: 6' (182.9 cm) Weight: 89.4 kg (197 lb 1.5 oz) IBW/kg (Calculated) : 77.6 HEPARIN  DW (KG): 85  Vital Signs: Temp: 98 F (36.7 C) (12/06 1634) Temp Source: Oral (12/06 1634) BP: 118/84 (12/06 1634) Pulse Rate: 120 (12/06 1634)  Labs: Recent Labs    04/27/24 0353 04/28/24 0016 04/28/24 1426 04/29/24 0334 04/29/24 1354 04/29/24 1823  HGB 11.9* 12.2*  --  10.9*  --   --   HCT 37.1* 37.7*  --  32.7*  --   --   PLT 365 373  --  290  --   --   APTT 88*  --  41*  --   --  46*  HEPARINUNFRC >1.10*  --  >1.10*  --   --  >1.10*  CREATININE 1.03 0.96  --  0.95 0.89  --     Estimated Creatinine Clearance: 77.5 mL/min (by C-G formula based on SCr of 0.89 mg/dL).  Assessment: 78 y/o M on with increasingly distended abdomen, on apixaban  PTA for afib, holding apixaban  and starting heparin  in case any procedures are needed, last dose of apixaban  was 12/2 around 1930. Continue using aPTT to dose for now. Pharmacy consulted to manage heparin .  Resuming Heparin  again this AM after being held for hematuria   PM update: aPTT 46 is subtherapeutic on 1300 units/hr. aPTT was previously therapeutic on this rate. Per RN, no pauses/interruptions to the infusion, no issues with IV site. No bleeding reported.  Goal of Therapy:  Heparin  level 0.3-0.7 units/ml aPTT 66-102 seconds Monitor platelets by anticoagulation protocol: Yes   Plan:  Increase heparin  gtt to 1450 units/hr Check aPTT and heparin  level in 8hrs Monitor with aPTTs until correlates with heparin  level CBC daily  Thank you for allowing pharmacy to be a part of this patient's care.   Rocky Slade, PharmD, BCPS 04/29/2024 7:22 PM  **Pharmacist phone  directory can be found on [email or web address] listed under [facility]**

## 2024-04-29 NOTE — Progress Notes (Signed)
 PT Cancellation Note  Patient Details Name: Chase Combs MRN: [id number] DOB: 15-Sep-1945   Cancelled Treatment:    Reason Eval/Treat Not Completed: (P) Medical issues which prohibited therapy (RN deferral elevate HR with Rolling for pericare.)   Chase Combs 04/29/2024, 4:06 PM  Toya HAMS , PTA Acute Rehabilitation Services Office [phone]

## 2024-04-29 NOTE — Progress Notes (Signed)
 PHARMACY - ANTICOAGULATION CONSULT NOTE  Pharmacy Consult for Heparin  (holding Apixaban ) Indication: atrial fibrillation  Allergies  Allergen Reactions   Codeine Nausea And Vomiting   Latex Rash    Oral blisters when used at dentist    Patient Measurements: Height: 6' (182.9 cm) Weight: 89.4 kg (197 lb 1.5 oz) IBW/kg (Calculated) : 77.6 HEPARIN  DW (KG): 85  Vital Signs: Temp: 97.9 F (36.6 C) (12/06 0808) Temp Source: Oral (12/06 0808) BP: 90/70 (12/06 0808) Pulse Rate: 123 (12/06 0808)  Labs: Recent Labs    04/26/24 1820 04/27/24 0353 04/27/24 0353 04/28/24 0016 04/28/24 1426 04/29/24 0334  HGB  --  11.9*   < > 12.2*  --  10.9*  HCT  --  37.1*  --  37.7*  --  32.7*  PLT  --  365  --  373  --  290  APTT 63* 88*  --   --  41*  --   HEPARINUNFRC >1.10* >1.10*  --   --  >1.10*  --   CREATININE  --  1.03  --  0.96  --  0.95   < > = values in this interval not displayed.    Estimated Creatinine Clearance: 72.6 mL/min (by C-G formula based on SCr of 0.95 mg/dL).  Assessment: 78 y/o M on with increasingly distended abdomen, on apixaban  PTA for afib, holding apixaban  and starting heparin  in case any procedures are needed, last dose of apixaban  was 12/2 around 1930. Continue using aPTT to dose for now. Pharmacy consulted to manage heparin .  Resuming Heparin  again this AM after being held for hematuria   Goal of Therapy:  Heparin  level 0.3-0.7 units/ml aPTT 66-102 seconds Monitor platelets by anticoagulation protocol: Yes   Plan:  Restart heparin  gtt at 1300 units/hr Monitor with aPTTs until correlates with heparin  level CBC daily    Thank you for allowing pharmacy to be a part of this patient's care.   Bascom JAYSON Louder, PharmD 04/29/2024 8:35 AM  **Pharmacist phone directory can be found on [email or web address] listed under [facility]**

## 2024-04-29 NOTE — Progress Notes (Signed)
 PROGRESS NOTE  Chase Combs FMW:[id number] DOB: Dec 24, 1945 DOA: 04/21/2024 PCP: Wonda Worth SQUIBB, PA  HPI/Recap of past 24 hours: 78 y.o. 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 Of note, patient had recent complicated admission for septic shock, cardiogenic shock, intestinal pneumatosis/ileus/pseudoobstruction/Ogilvie syndrome. Now admitted for recurrent Ogilvie syndrome, suspected gastroenterocolitis, AKI on CKD, hypotension and acute debilitation.       Today, pt appears to be very lethargic. Very poor prognosis    Assessment/Plan: Principal Problem:   Enterocolitis Active Problems:   Ogilvie syndrome   Chronic systolic CHF (congestive heart failure) (HCC)   Hypokalemia   HLD (hyperlipidemia)   Acute kidney injury   DM2 (diabetes mellitus, type 2) (HCC)   Postural dizziness with presyncope   Ileus, unspecified (HCC)   Elevated troponin   Fall at home, initial encounter   Hypotension   Paroxysmal atrial flutter (HCC)   Anemia of chronic disease   Adynamic ileus (HCC)   Chronic combined systolic and diastolic congestive heart failure (HCC)    Diffuse progressive ileus Recurrent Ogilvie Syndrome History of recent hospitalization (01/22/24-02/25/24) for similar GI symptoms with extensive workup. Underwent colonic decompression (02/18/24) with improvement Currently afebrile, with no leukocytosis C diff and enteric PCR panel negative CT A/P w/o contrast with noted diffuse  mild dilatation of colon, distended ileal loops and stomach Repeat abdominal x-ray on 12/3 showed diffuse ileus involving colon and small bowel GI consulted, appreciate recs, continue MiraLAX , rectal tube, high risk for complications with sedation for colonic decompression Continue FLD Aspiration precautions Monitor closely  Acute hypoxic respiratory failure Currently requiring about 3 L of O2 ABG showed pH 7.37, pO2 75, CO2 40 D-dimer mildly elevated Procalcitonin 0.26 Likely in the setting of abdominal distension from underlying ileus/pseudoobstruction/gastroenterocolitis in addition to advanced CHF (required IV fluid resuscitation due to AKI) CTA chest showing no PE, but new and increased bilateral ground glass opacity, airway thickening which may reflect atypical pneumonia, acute eosinophilic pneumonia, hypersensitivity pneumonitis, pulmonary edema or pulmonary hemorrhage, noted mucus in the right mainstem bronchus PCCM consulted Continue IV antibiotics, DuoNebs, nebulizers Supplemental O2 as needed  CHFrEF Ischemic cardiomyopathy Recent prolonged hospitalization complicated by cardiogenic shock with acute CHF exacerbation in the setting of LAD stent stenosis  BNP 1984 Last echo 01/2024 with LVEF 20-25% CXR 12/2 showed bilateral opacities Cardiology consulted, appreciate recs Restart lasix  40 mg daily, hold spironolactone , entresto , digoxin , Jardiance  Monitor I/Os, daily weights, renal function  Paroxysmal Afib/flutter with RVR Heart rate improved S/p TEE CV on 02/21/2024 Cardiology on board, appreciate recs Continue IV amiodarone , IV heparin  Eliquis  held Telemetry  Gross hematuria Resolved Noted hematuria on 12/4 Renal ultrasound showed enlarged prostate protruding into the inferior bladder, layering debris's in the bladder could reflect blood clot Urology consulted, placed foley on 12/5 Continue flomax  Monitor closely while on IV heparin   CAD s/p DES to LAD  8/25 Hyperlipidemia Recent balloon angioplasty of LAD stent thrombosis (02/01/2024) due to inability to tolerate oral DAPT  History of remote PCI to RI and RCA EKG with known RBBB, LAFB, no significant changes from prior  Troponin 67 < 71 Cardiology consulted, appreciate recs Cont home plavix , held Eliquis , continue Crestor  Telemetry  Hypotension BP soft Likely in the setting of dehydration/orthostatic hypotension from possible ileus/pseudoobstruction/gastroenterocolitis CT head unremarkable S/p IV fluids Continue to hold BP meds  Hypokalemia/hypomagnesemia Likely in the setting of ileus, frequent diarrhea Monitor and replete electrolytes as needed   AKI on CKD Resolved Cr 1.11 < 2.1, baseline 0.9-1.5 Likely in the setting of dehydration from diarrhea CT A/P with no concern for obstructive disease Holding home entresto , spironolactone  Avoid nephrotoxic agents Renal dose medications Monitor BMP   Peptic ulcer disease Recent hospitalization with EGD (02/02/24) showing esophageal ulcer without bleeding, grade C reflux esophagitis without bleeding, duodenal erosions Cont pantoprazole    T2DM Hold home Jardiance  Cont SSI   Incidental R thyroid  nodule Noted on CT C spine TSH normal Will need outpatient thyroid  US    Chronic normocytic anemia Hgb 11.2, baseline 10-12 Daily CBC   Acute debilitation Mechanical fall Monitor on fall precautions PT/OT following- rec SNF  GOC discussion Patient with poor prognosis, recurrent severe ileus, advanced CHF Palliative care consulted, appreciate recs Currently DNR   Malnutrition Type:  Nutrition Problem: Moderate Malnutrition Etiology: chronic illness   Malnutrition Characteristics:  Signs/Symptoms: moderate fat depletion, severe muscle depletion, moderate muscle depletion   Nutrition Interventions:  Interventions: Refer to RD note for recommendations    Estimated body mass index is 26.73 kg/m as calculated from the  following:   Height as of this encounter: 6' (1.829 m).   Weight as of this encounter: 89.4 kg.     Code Status: DNR  Family Communication: Discussed with wife over the phone on 12/5  Disposition Plan: Status is: Inpatient Remains inpatient appropriate because: Level of care      Consultants: GI Cardiology PCCM Urology  Procedures: None  Antimicrobials: Cefepime  Azithromycin   DVT prophylaxis: IV heparin    Objective: Vitals:   04/29/24 1200 04/29/24 1300 04/29/24 1310 04/29/24 1400  BP: 97/73 (!) 89/72 105/70 106/70  Pulse: (!) 118 (!) 116 (!) 120 (!) 120  Resp: 20 20 (!) 25 (!) 25  Temp:      TempSrc:      SpO2: 98% 99% 97% 99%  Weight:      Height:        Intake/Output Summary (Last 24 hours) at 04/29/2024 1614 Last data filed at 04/29/2024 1528 Gross per 24 hour  Intake 711.52 ml  Output 1200 ml  Net -488.48 ml   Filed Weights   04/27/24 0623 04/28/24 0401 04/29/24 0420  Weight: 90.3 kg 88 kg 89.4 kg    Exam: General: NAD, awake/alert, lethargic Cardiovascular: S1, S2 present Respiratory: Diminished BS bilaterally Abdomen: Firm, nontender, +distended, bowel sounds present Musculoskeletal: No bilateral pedal edema noted Skin: Normal Psychiatry: fair mood     Data Reviewed: CBC: Recent Labs  Lab 04/23/24 0223 04/25/24 2339 04/27/24 0353 04/28/24 0016 04/29/24 0334  WBC 5.0 8.0 7.9 8.2 6.0  NEUTROABS 2.9 5.6 5.7 6.2 5.6  HGB 12.4* 12.6* 11.9* 12.2* 10.9*  HCT 38.2* 39.7 37.1* 37.7* 32.7*  MCV 91.6 92.8 92.8 91.3 90.1  PLT 307 378 365 373 290   Basic Metabolic Panel: Recent Labs  Lab 04/23/24 0223 04/23/24 1335 04/24/24 0653 04/24/24 1756 04/25/24 0535 04/25/24 0745 04/25/24 2339 04/26/24 0734 04/27/24 0353 04/28/24 0016 04/29/24 0334 04/29/24 1354  NA 132*   < > 134*   < >  --    < > 136 138 138 138 134* 135  K 3.0*   < >  2.9*   < >  --    < > 4.3 3.9 4.2 3.7 2.6* 3.5  CL 100   < > 103   < >  --    < > 110 107 107 105  106 107  CO2 18*   < > 20*   < >  --    < > 22 22 24 23  19* 19*  GLUCOSE 109*   < > 88   < >  --    < > 169* 157* 134* 110* 149* 143*  BUN 68*   < > 70*   < >  --    < > 45* 44* 41* 34* 33* 29*  CREATININE 1.85*   < > 1.53*   < >  --    < > 1.09 1.10 1.03 0.96 0.95 0.89  CALCIUM  8.5*   < > 8.2*   < >  --    < > 8.4* 8.7* 8.3* 8.2* 7.9* 7.9*  MG 2.0  --  2.1  --  2.2  --  2.3  --   --   --  2.0  --    < > = values in this interval not displayed.   GFR: Estimated Creatinine Clearance: 77.5 mL/min (by C-G formula based on SCr of 0.89 mg/dL). Liver Function Tests: Recent Labs  Lab 04/23/24 0223  AST 52*  ALT 66*  ALKPHOS 60  BILITOT 0.5  PROT 5.7*  ALBUMIN 2.6*   No results for input(s): LIPASE, AMYLASE in the last 168 hours. No results for input(s): AMMONIA in the last 168 hours. Coagulation Profile: No results for input(s): INR, PROTIME in the last 168 hours.  Cardiac Enzymes: No results for input(s): CKTOTAL, CKMB, CKMBINDEX, TROPONINI in the last 168 hours. BNP (last 3 results) Recent Labs    01/07/24 2301  PROBNP 15,245.0*   HbA1C: No results for input(s): HGBA1C in the last 72 hours. CBG: Recent Labs  Lab 04/28/24 2041 04/29/24 0004 04/29/24 0430 04/29/24 0810 04/29/24 1144  GLUCAP 115* 124* 138* 131* 148*   Lipid Profile: No results for input(s): CHOL, HDL, LDLCALC, TRIG, CHOLHDL, LDLDIRECT in the last 72 hours. Thyroid  Function Tests: No results for input(s): TSH, T4TOTAL, FREET4, T3FREE, THYROIDAB in the last 72 hours. Anemia Panel: No results for input(s): VITAMINB12, FOLATE, FERRITIN, TIBC, IRON, RETICCTPCT in the last 72 hours. Urine analysis:    Component Value Date/Time   COLORURINE YELLOW 04/22/2024 1205   APPEARANCEUR CLEAR 04/22/2024 1205   LABSPEC >1.030 (H) 04/22/2024 1205   PHURINE 5.5 04/22/2024 1205   GLUCOSEU NEGATIVE 04/22/2024 1205   HGBUR NEGATIVE 04/22/2024 1205   BILIRUBINUR  NEGATIVE 04/22/2024 1205   KETONESUR NEGATIVE 04/22/2024 1205   PROTEINUR NEGATIVE 04/22/2024 1205   UROBILINOGEN 0.2 02/17/2009 0645   NITRITE NEGATIVE 04/22/2024 1205   LEUKOCYTESUR NEGATIVE 04/22/2024 1205   Sepsis Labs: @LABRCNTIP (procalcitonin:4,lacticidven:4)  ) Recent Results (from the past 240 hours)  Resp panel by RT-PCR (RSV, Flu A&B, Covid) Anterior Nasal Swab     Status: None   Collection Time: 04/21/24  7:51 PM   Specimen: Anterior Nasal Swab  Result Value Ref Range Status   SARS Coronavirus 2 by RT PCR NEGATIVE NEGATIVE Final    Comment: (NOTE) SARS-CoV-2 target nucleic acids are NOT DETECTED.  The SARS-CoV-2 RNA is generally detectable in upper respiratory specimens during the acute phase of infection. The lowest concentration of SARS-CoV-2 viral copies this assay can detect is 138 copies/mL. A negative result does not preclude  SARS-Cov-2 infection and should not be used as the sole basis for treatment or other patient management decisions. A negative result may occur with  improper specimen collection/handling, submission of specimen other than nasopharyngeal swab, presence of viral mutation(s) within the areas targeted by this assay, and inadequate number of viral copies(<138 copies/mL). A negative result must be combined with clinical observations, patient history, and epidemiological information. The expected result is Negative.  Fact Sheet for Patients:  bloggercourse.com  Fact Sheet for Healthcare Providers:  seriousbroker.it  This test is no t yet approved or cleared by the [place]  FDA and  has been authorized for detection and/or diagnosis of SARS-CoV-2 by FDA under an Emergency Use Authorization (EUA). This EUA will remain  in effect (meaning this test can be used) for the duration of the COVID-19 declaration under Section 564(b)(1) of the Act, 21 U.S.C.section 360bbb-3(b)(1), unless the  authorization is terminated  or revoked sooner.       Influenza A by PCR NEGATIVE NEGATIVE Final   Influenza B by PCR NEGATIVE NEGATIVE Final    Comment: (NOTE) The Xpert Xpress SARS-CoV-2/FLU/RSV plus assay is intended as an aid in the diagnosis of influenza from Nasopharyngeal swab specimens and should not be used as a sole basis for treatment. Nasal washings and aspirates are unacceptable for Xpert Xpress SARS-CoV-2/FLU/RSV testing.  Fact Sheet for Patients: bloggercourse.com  Fact Sheet for Healthcare Providers: seriousbroker.it  This test is not yet approved or cleared by the [place]  FDA and has been authorized for detection and/or diagnosis of SARS-CoV-2 by FDA under an Emergency Use Authorization (EUA). This EUA will remain in effect (meaning this test can be used) for the duration of the COVID-19 declaration under Section 564(b)(1) of the Act, 21 U.S.C. section 360bbb-3(b)(1), unless the authorization is terminated or revoked.     Resp Syncytial Virus by PCR NEGATIVE NEGATIVE Final    Comment: (NOTE) Fact Sheet for Patients: bloggercourse.com  Fact Sheet for Healthcare Providers: seriousbroker.it  This test is not yet approved or cleared by the [place]  FDA and has been authorized for detection and/or diagnosis of SARS-CoV-2 by FDA under an Emergency Use Authorization (EUA). This EUA will remain in effect (meaning this test can be used) for the duration of the COVID-19 declaration under Section 564(b)(1) of the Act, 21 U.S.C. section 360bbb-3(b)(1), unless the authorization is terminated or revoked.  Performed at [facility], [address] [place], [place] [address]   C Difficile Quick Screen w PCR reflex     Status: None   Collection Time: 04/22/24  6:33 PM   Specimen: STOOL  Result Value Ref Range Status   C Diff antigen  NEGATIVE NEGATIVE Final   C Diff toxin NEGATIVE NEGATIVE Final   C Diff interpretation No C. difficile detected.  Final    Comment: Performed at [facility], [address], [place], [place] [address]  Gastrointestinal Panel by PCR , Stool     Status: None   Collection Time: 04/22/24  6:33 PM   Specimen: Stool  Result Value Ref Range Status   Campylobacter species NOT DETECTED NOT DETECTED Final   Plesimonas shigelloides NOT DETECTED NOT DETECTED Final   Salmonella species NOT DETECTED NOT DETECTED Final   Yersinia enterocolitica NOT DETECTED NOT DETECTED Final   Vibrio species NOT DETECTED NOT DETECTED Final   Vibrio cholerae NOT DETECTED NOT DETECTED Final   Enteroaggregative E coli (EAEC) NOT DETECTED NOT DETECTED Final   Enteropathogenic E coli (EPEC)  NOT DETECTED NOT DETECTED Final   Enterotoxigenic E coli (ETEC) NOT DETECTED NOT DETECTED Final   Shiga like toxin producing E coli (STEC) NOT DETECTED NOT DETECTED Final   Shigella/Enteroinvasive E coli (EIEC) NOT DETECTED NOT DETECTED Final   Cryptosporidium NOT DETECTED NOT DETECTED Final   Cyclospora cayetanensis NOT DETECTED NOT DETECTED Final   Entamoeba histolytica NOT DETECTED NOT DETECTED Final   Giardia lamblia NOT DETECTED NOT DETECTED Final   Adenovirus F40/41 NOT DETECTED NOT DETECTED Final   Astrovirus NOT DETECTED NOT DETECTED Final   Norovirus GI/GII NOT DETECTED NOT DETECTED Final   Rotavirus A NOT DETECTED NOT DETECTED Final   Sapovirus (I, II, IV, and V) NOT DETECTED NOT DETECTED Final    Comment: Performed at [facility], [address] [place], [place] [address]  Respiratory (~20 pathogens) panel by PCR     Status: None   Collection Time: 04/28/24  6:06 PM   Specimen: Nasopharyngeal Swab; Respiratory  Result Value Ref Range Status   Adenovirus NOT DETECTED NOT DETECTED Final   Coronavirus 229E NOT DETECTED NOT DETECTED Final    Comment: (NOTE) The Coronavirus on the Respiratory Panel, DOES  NOT test for the novel  Coronavirus (2019 nCoV)    Coronavirus HKU1 NOT DETECTED NOT DETECTED Final   Coronavirus NL63 NOT DETECTED NOT DETECTED Final   Coronavirus OC43 NOT DETECTED NOT DETECTED Final   Metapneumovirus NOT DETECTED NOT DETECTED Final   Rhinovirus / Enterovirus NOT DETECTED NOT DETECTED Final   Influenza A NOT DETECTED NOT DETECTED Final   Influenza B NOT DETECTED NOT DETECTED Final   Parainfluenza Virus 1 NOT DETECTED NOT DETECTED Final   Parainfluenza Virus 2 NOT DETECTED NOT DETECTED Final   Parainfluenza Virus 3 NOT DETECTED NOT DETECTED Final   Parainfluenza Virus 4 NOT DETECTED NOT DETECTED Final   Respiratory Syncytial Virus NOT DETECTED NOT DETECTED Final   Bordetella pertussis NOT DETECTED NOT DETECTED Final   Bordetella Parapertussis NOT DETECTED NOT DETECTED Final   Chlamydophila pneumoniae NOT DETECTED NOT DETECTED Final   Mycoplasma pneumoniae NOT DETECTED NOT DETECTED Final    Comment: Performed at [facility], [address] [place], [place] [address]      Studies: No results found.    Scheduled Meds:  acetylcysteine   2 mL Nebulization TID   azithromycin   500 mg Oral Daily   budesonide  (PULMICORT ) nebulizer solution  0.25 mg Nebulization BID   Chlorhexidine  Gluconate Cloth  6 each Topical Daily   clopidogrel   75 mg Oral Daily   feeding supplement  1 Container Oral TID BM   fiber supplement (BANATROL TF)  60 mL Oral BID   furosemide   40 mg Oral Daily   insulin  aspart  0-6 Units Subcutaneous Q4H   ipratropium  0.5 mg Nebulization Q8H   lidocaine   1 Application Urethral Once   pantoprazole  (PROTONIX ) IV  40 mg Intravenous Q24H   polyethylene glycol  17 g Oral TID BM   revefenacin   175 mcg Nebulization Daily   rosuvastatin   20 mg Oral Daily   tamsulosin   0.4 mg Oral QPC supper    Continuous Infusions:  amiodarone  30 mg/hr (04/29/24 0427)   ceFEPime  (MAXIPIME ) IV 2 g (04/29/24 1140)   heparin  1,300 Units/hr (04/29/24 1022)      LOS: 7 days     Lebron JINNY Cage, MD [facility]  If 7PM-7AM, please contact night-coverage [email or web address] 04/29/2024, 4:14 PM

## 2024-04-29 NOTE — Progress Notes (Addendum)
 NAME:  Chase Combs, MRN:  [id number], DOB:  01/10/1946, LOS: 7 ADMISSION DATE:  04/21/2024, CONSULTATION DATE:  12/5 REFERRING MD:  Donnamarie, CHIEF COMPLAINT:  resp failure    History of Present Illness:  78 year old male patient with extensive medical history including ischemic cardiomyopathy with EF 20 to 25%, chronic atrial fibrillation, coronary artery disease, atrial fibrillation, and recurrent acute colonic pseudo obstruction Presented to the emergency room on 11/29 with episode of presyncope after 1 to 2 days of nausea vomiting and diarrhea.  In ER initially found to be hypotensive, responding initially to IV fluid hydration, had acute on chronic renal failure, and CT of abdomen and pelvis showed diffuse mild dilation of the colon without a transition point suggesting colitis versus ileus.  Was admitted to the medical team, gastroenterology services were consulted for progressive ileus/Ogilvie syndrome, who also felt likely the recurrent pseudoobstruction may also be exacerbated by chronic low flow state from his congestive heart failure.  Cardiology was consulted for ongoing atrial fibrillation initially.  Therapeutic interventions in regards to the Ogilvie syndrome has included initially IV hydration, antiemetics, and tapwater enemas.  From a cardiology standpoint his paroxysmal atrial fibrillation was treated with IV amiodarone .  On 12/3 noted to have increased abdominal tightness Worsening abdominal discomfort.  Mild increased need and FiO2.  On 12/4 began to report increased shortness of breath, and simply said just felt worn out, noted to be a bit more lethargic compared to prior exams.  IV fluids were discontinued on 12/5 continued to have significant lethargy, ongoing oxygen need, and worsening work of breathing and therefore a CT angiogram was obtained this demonstrated diffuse bilateral patchy airspace disease thickened esophagus and mucus in the right mainstem bronchus with increased  colonic distention and air-fluid level.  Because of ongoing hypoxia and CT findings pulmonary was asked to evaluate  Pertinent  Medical History  Ischemic cardiomyopathy with a EF 20 to 25%, coronary artery disease with prior stenting to the LAD in August 2025, atrial fibrillation, hyperlipidemia, hypertension type 2 diabetes, prostate cancer status post radiation 2023, recurrent acute colonic pseudoobstruction  Significant Hospital Events: Including procedures, antibiotic start and stop dates in addition to other pertinent events   Admitted 04/22/2024 PCCM consulted 12.05 for hypoxia   Interim History / Subjective:  Reports mild shortness of breath, stable from yesterday Occasional dry cough  Objective    Blood pressure 111/77, pulse (!) 115, temperature 98 F (36.7 C), temperature source Oral, resp. rate 20, height 6' (1.829 m), weight 89.4 kg, SpO2 98%.    FiO2 (%):  [28 %] 28 %   Intake/Output Summary (Last 24 hours) at 04/29/2024 1210 Last data filed at 04/29/2024 0849 Gross per 24 hour  Intake 989.95 ml  Output 1225 ml  Net -235.05 ml   Filed Weights   04/27/24 0623 04/28/24 0401 04/29/24 0420  Weight: 90.3 kg 88 kg 89.4 kg    Examination: General: awake, laying on bed HENT: normal exam Lungs: scattered rales Cardiovascular: S1,S2 Abdomen: distended Extremities: mild edema Neuro: oriented   Resolved problem list   Assessment and Plan   Acute hypoxic respiratory failure from multifocal pneumonia Acute colonic pseudo-obstruction Fall Ischemic cardiomyopathy with EF 20-25%   Ct chest shows new GGO b/l and mucous plug in right mainstem. Has some scarring visible on prior ct abd in lower lobes but not as prominent then Broad differentials- aspiration pneumonia/pneumonitis, viral pneumonia, less likely pulm edema, other rare etiologies like eosinophilic pneumonia, HP, pulm hemorrhage, etc Viral  swab negative, unable to produce sputum Given new oxygen requirement,  agree with empiric antibiotics,. Currently on cefepime  and azithromycin . Can continue the same or change to zosyn  or levoquin as less likely for this to be atypical community acquired infections at this point, check MRSA nares Aspiration precautions Continue pulmicort  and yupelri , change duonebs to ipratropium neb given tachycardia. Add mucomyst   Goals of care ongoing   Labs   CBC: Recent Labs  Lab 04/23/24 0223 04/25/24 2339 04/27/24 0353 04/28/24 0016 04/29/24 0334  WBC 5.0 8.0 7.9 8.2 6.0  NEUTROABS 2.9 5.6 5.7 6.2 5.6  HGB 12.4* 12.6* 11.9* 12.2* 10.9*  HCT 38.2* 39.7 37.1* 37.7* 32.7*  MCV 91.6 92.8 92.8 91.3 90.1  PLT 307 378 365 373 290    Basic Metabolic Panel: Recent Labs  Lab 04/23/24 0223 04/23/24 1335 04/24/24 0653 04/24/24 1756 04/25/24 0535 04/25/24 0745 04/25/24 2339 04/26/24 0734 04/27/24 0353 04/28/24 0016 04/29/24 0334  NA 132*   < > 134*   < >  --    < > 136 138 138 138 134*  K 3.0*   < > 2.9*   < >  --    < > 4.3 3.9 4.2 3.7 2.6*  CL 100   < > 103   < >  --    < > 110 107 107 105 106  CO2 18*   < > 20*   < >  --    < > 22 22 24 23  19*  GLUCOSE 109*   < > 88   < >  --    < > 169* 157* 134* 110* 149*  BUN 68*   < > 70*   < >  --    < > 45* 44* 41* 34* 33*  CREATININE 1.85*   < > 1.53*   < >  --    < > 1.09 1.10 1.03 0.96 0.95  CALCIUM  8.5*   < > 8.2*   < >  --    < > 8.4* 8.7* 8.3* 8.2* 7.9*  MG 2.0  --  2.1  --  2.2  --  2.3  --   --   --  2.0   < > = values in this interval not displayed.   GFR: Estimated Creatinine Clearance: 72.6 mL/min (by C-G formula based on SCr of 0.95 mg/dL). Recent Labs  Lab 04/25/24 2339 04/26/24 0219 04/27/24 0353 04/27/24 1344 04/28/24 0016 04/29/24 0334  PROCALCITON  --   --   --  0.26  --   --   WBC 8.0  --  7.9  --  8.2 6.0  LATICACIDVEN 1.2 1.0  --   --   --   --     Liver Function Tests: Recent Labs  Lab 04/23/24 0223  AST 52*  ALT 66*  ALKPHOS 60  BILITOT 0.5  PROT 5.7*  ALBUMIN 2.6*   No  results for input(s): LIPASE, AMYLASE in the last 168 hours. No results for input(s): AMMONIA in the last 168 hours.  ABG    Component Value Date/Time   PHART 7.36 04/28/2024 1805   PCO2ART 34 04/28/2024 1805   PO2ART 97 04/28/2024 1805   HCO3 19.3 (L) 04/28/2024 1805   TCO2 34 (H) 01/22/2024 1259   ACIDBASEDEF 5.6 (H) 04/28/2024 1805   O2SAT 99.6 04/28/2024 1805     Coagulation Profile: No results for input(s): INR, PROTIME in the last 168 hours.   Cardiac Enzymes: No results for input(s):  CKTOTAL, CKMB, CKMBINDEX, TROPONINI in the last 168 hours.  HbA1C: Hgb A1c MFr Bld  Date/Time Value Ref Range Status  04/22/2024 06:07 AM 5.5 4.8 - 5.6 % Final    Comment:    (NOTE)         Prediabetes: 5.7 - 6.4         Diabetes: >6.4         Glycemic control for adults with diabetes: <7.0   09/22/2023 08:46 AM 6.4 (H) 4.8 - 5.6 % Final    Comment:    (NOTE) Pre diabetes:          5.7%-6.4%  Diabetes:              >6.4%  Glycemic control for   <7.0% adults with diabetes     CBG: Recent Labs  Lab 04/28/24 2041 04/29/24 0004 04/29/24 0430 04/29/24 0810 04/29/24 1144  GLUCAP 115* 124* 138* 131* 148*   Ct chest 04/28/2024 Mucus in the right mainstem bronchus. New and increased patch of ground glass opacities in both lungs with band-like components in the lobes of the right lung and in the left upper lobe volume loss most striking in the right lower lobe. Mild airway thickening noted bilaterally.  Review of Systems:   Denies cough, wheezing Abd pain  Past Medical History:  He,  has a past medical history of Abdominal hernia, Arthritis, Benign localized prostatic hyperplasia with lower urinary tract symptoms (LUTS), CAD (coronary artery disease) (05/2004), Dyslipidemia, Essential hypertension, Heart murmur, History of COVID-19 (05/2020), History of ST elevation myocardial infarction (STEMI) (02/16/2009), History of urinary retention, Malignant neoplasm  of prostate (HCC) (04/2021), OSA (obstructive sleep apnea), PONV (postoperative nausea and vomiting), Pre-diabetes, S/p bare metal coronary artery stent, and Status post total knee replacement, 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   Surgical History:   Past Surgical History:  Procedure Laterality Date   BOWEL DECOMPRESSION N/A 02/18/2024   Procedure: DECOMPRESSION, INTESTINE;  Surgeon: Albertus Gordy HERO, MD;  Location: [facility] ENDOSCOPY;  Service: Gastroenterology;  Laterality: N/A;   CARDIAC CATHETERIZATION  05/27/2004   outpatient by dr burnard [facility] cardiology) for positive stress test;  80% stensis pRI   CARDIOVERSION N/A 02/21/2024   Procedure: CARDIOVERSION;  Surgeon: Cherrie Toribio SAUNDERS, MD;  Location: [facility] INVASIVE CV LAB;  Service: Cardiovascular;  Laterality: N/A;   COLONOSCOPY N/A 02/18/2024   Procedure: COLONOSCOPY;  Surgeon: Albertus Gordy HERO, MD;  Location: [facility] ENDOSCOPY;  Service: Gastroenterology;  Laterality: N/A;   CORONARY ANGIOPLASTY WITH STENT PLACEMENT  06/18/2004   @[facility]  by Dr Lavon;  PCI w/ BMS to pRI  (CoStar study stent - 2.5 x 16)   CORONARY ANGIOPLASTY WITH STENT PLACEMENT  02/16/2009   @[facility]  by dr verlin;   Inferior STEMI:  PCI w/ thrombectomy total occluded RCA, BMS x1 to pRCA (Vision BMS 3.5 x 23 -> 4.0 mm), residual 40-50% pLAD, ef 45-50%   CORONARY PRESSURE/FFR STUDY N/A 01/17/2024   Procedure: CORONARY PRESSURE/FFR STUDY;  Surgeon: Mady Bruckner, MD;  Location: [facility] INVASIVE CV LAB;  Service: Cardiovascular;  Laterality: N/A;   CORONARY STENT INTERVENTION N/A 01/17/2024   Procedure: CORONARY STENT INTERVENTION;  Surgeon: Mady Bruckner, MD;  Location: [facility] INVASIVE CV LAB;  Service: Cardiovascular;  Laterality: N/A;   CORONARY ULTRASOUND/IVUS N/A 01/17/2024   Procedure: Coronary Ultrasound/IVUS;  Surgeon: Mady Bruckner, MD;  Location: [facility] INVASIVE CV LAB;  Service: Cardiovascular;  Laterality: N/A;   CORONARY/GRAFT ACUTE MI REVASCULARIZATION N/A 02/01/2024   Procedure: Coronary/Graft Acute  MI Revascularization;  Surgeon: Elmira Penman  J, MD;  Location: [facility] INVASIVE CV LAB;  Service: Cardiovascular;  Laterality: N/A;   ESOPHAGOGASTRODUODENOSCOPY N/A 02/02/2024   Procedure: EGD (ESOPHAGOGASTRODUODENOSCOPY);  Surgeon: Nandigam, Kavitha V, MD;  Location: [facility] ENDOSCOPY;  Service: Gastroenterology;  Laterality: N/A;   FLEXIBLE SIGMOIDOSCOPY N/A 10/20/2023   Procedure: KINGSTON SIDE;  Surgeon: Legrand Victory LITTIE DOUGLAS, MD;  Location[facility] ENDOSCOPY;  Service: Gastroenterology;  Laterality: N/A;   FLEXIBLE SIGMOIDOSCOPY N/A 02/02/2024   Procedure: SIGMOIDOSCOPY, FLEXIBLE;  Surgeon: Shila Gustav GAILS, MD;  Location: [facility] ENDOSCOPY;  Service: Gastroenterology;  Laterality: N/A;   GOLD SEED IMPLANT N/A 09/19/2021   Procedure: GOLD SEED IMPLANT;  Surgeon: Cam Morene ORN, MD;  Location: [facility];  Service: Urology;  Laterality: N/A;  ONLY NEEDS 30 MIN FOR ALL   INGUINAL HERNIA REPAIR Right 09/02/1999   @[facility] ;   recurrent repair right inguinal hernia;   previous repair approx 1990s   INGUINAL HERNIA REPAIR Left    1990s   KNEE ARTHROSCOPY W/ MENISCAL REPAIR Right 12/18/2005   @[facility]    LUMBAR DISC SURGERY  1982   L5--S1   RIGHT/LEFT HEART CATH AND CORONARY ANGIOGRAPHY N/A 01/17/2024   Procedure: RIGHT/LEFT HEART CATH AND CORONARY ANGIOGRAPHY;  Surgeon: Mady Bruckner, MD;  Location: [facility] INVASIVE CV LAB;  Service: Cardiovascular;  Laterality: N/A;   SPACE OAR INSTILLATION N/A 09/19/2021   Procedure: SPACE OAR INSTILLATION;  Surgeon: Cam Morene ORN, MD;  Location: [facility];  Service: Urology;  Laterality: N/A;   TOTAL KNEE ARTHROPLASTY Left 10/01/2023   Procedure: ARTHROPLASTY, KNEE, TOTAL;  Surgeon: Kay Kemps, MD;  Location: [facility] ORS;  Service: Orthopedics;  Laterality: Left;   TRANSESOPHAGEAL ECHOCARDIOGRAM (CATH LAB) N/A 02/21/2024   Procedure: TRANSESOPHAGEAL ECHOCARDIOGRAM;  Surgeon: Cherrie Toribio SAUNDERS, MD;  Location: [facility] INVASIVE CV LAB;  Service:  Cardiovascular;  Laterality: N/A;     Social History:   reports that he has never smoked. He has never used smokeless tobacco. He reports that he does not drink alcohol and does not use drugs.   Family History:  His family history includes Lung cancer in his mother. There is no history of Colon cancer or Esophageal cancer.   Allergies Allergies  Allergen Reactions   Codeine Nausea And Vomiting   Latex Rash    Oral blisters when used at dentist     Home Medications  Prior to Admission medications   Medication Sig Start Date End Date Taking? Authorizing Provider  amiodarone  (PACERONE ) 200 MG tablet Take 1 tablet (200 mg total) by mouth daily. 04/07/24  Yes Milford, Harlene HERO, FNP  apixaban  (ELIQUIS ) 5 MG TABS tablet Take 1 tablet (5 mg total) by mouth 2 (two) times daily. 03/23/24  Yes Colletta Manuelita Garre, PA-C  CALCIUM  PO Take 500 mg by mouth daily.   Yes [provider]  clopidogrel  (PLAVIX ) 75 MG tablet Take 1 tablet (75 mg total) by mouth daily. 03/23/24  Yes Colletta Manuelita Garre, PA-C  cyanocobalamin  (VITAMIN B12) 1000 MCG tablet Take 1,000 mcg by mouth daily.   Yes [provider]  digoxin  (LANOXIN ) 0.125 MG tablet Take 1/2 tablet (0.0625 mg total) by mouth daily. 03/23/24  Yes Colletta Manuelita Garre, PA-C  empagliflozin  (JARDIANCE ) 10 MG TABS tablet Take 1 tablet (10 mg total) by mouth daily before breakfast. 03/10/24  Yes Lee, Jordan, NP  furosemide  (LASIX ) 40 MG tablet Take 1 tablet (40 mg total) by mouth 2 (two) times daily. 04/07/24 07/06/24 Yes Milford, Harlene HERO, FNP  hydrOXYzine (ATARAX) 25 MG  tablet Take 12.5-25 mg by mouth at bedtime as needed for anxiety. 03/27/24  Yes [provider]  Multiple Vitamins-Minerals (MULTIVITAMIN MEN 50+) TABS Take 1 tablet by mouth daily.   Yes [provider]  nitroGLYCERIN  (NITROSTAT ) 0.4 MG SL tablet Place 0.4 mg under the tongue every 5 (five) minutes as needed for chest pain. 01/19/24  Yes [provider]  ondansetron  (ZOFRAN ) 8 MG tablet Take 8 mg by mouth every 8 (eight) hours as needed for nausea or vomiting. 02/29/24  Yes [provider]  pantoprazole  (PROTONIX ) 40 MG tablet Take 1 tablet (40 mg total) by mouth daily. 02/24/24  Yes Arrien, Elidia Sieving, MD  potassium chloride  SA (KLOR-CON  M) 20 MEQ tablet Take 1 tablet (20 mEq total) by mouth 2 (two) times daily. 04/07/24  Yes Milford, Harlene HERO, FNP  Probiotic Product (PROBIOTIC PO) Take 1,000 mg by mouth daily. Chew   Yes [provider]  rosuvastatin  (CRESTOR ) 20 MG tablet TAKE 1 TABLET(20 MG) BY MOUTH DAILY 11/29/23  Yes Anner Alm ORN, MD  sacubitril -valsartan  (ENTRESTO ) 24-26 MG Take 1 tablet by mouth 2 (two) times daily. 04/07/24  Yes Milford, Harlene HERO, FNP  sertraline (ZOLOFT) 50 MG tablet Take 50 mg by mouth daily.   Yes [provider]  spironolactone  (ALDACTONE ) 25 MG tablet Take 0.5 tablets (12.5 mg total) by mouth daily. 03/14/24 06/12/24 Yes Milford, Harlene HERO, FNP  tamsulosin  (FLOMAX ) 0.4 MG CAPS capsule Take 1 capsule (0.4 mg total) by mouth daily after supper. 11/17/21  Yes Patrcia Cough, MD     Critical care time: n/a

## 2024-04-30 ENCOUNTER — Inpatient Hospital Stay (HOSPITAL_COMMUNITY)

## 2024-04-30 DIAGNOSIS — K529 Noninfective gastroenteritis and colitis, unspecified: Secondary | ICD-10-CM | POA: Diagnosis not present

## 2024-04-30 DIAGNOSIS — K567 Ileus, unspecified: Secondary | ICD-10-CM | POA: Diagnosis not present

## 2024-04-30 DIAGNOSIS — K5981 Ogilvie syndrome: Secondary | ICD-10-CM | POA: Diagnosis not present

## 2024-04-30 LAB — CBC WITH DIFFERENTIAL/PLATELET
Abs Immature Granulocytes: 0.03 K/uL (ref 0.00–0.07)
Basophils Absolute: 0 K/uL (ref 0.0–0.1)
Basophils Relative: 0 %
Eosinophils Absolute: 0.1 K/uL (ref 0.0–0.5)
Eosinophils Relative: 1 %
HCT: 31.9 % — ABNORMAL LOW (ref 39.0–52.0)
Hemoglobin: 10.4 g/dL — ABNORMAL LOW (ref 13.0–17.0)
Immature Granulocytes: 1 %
Lymphocytes Relative: 12 %
Lymphs Abs: 0.8 K/uL (ref 0.7–4.0)
MCH: 29.5 pg (ref 26.0–34.0)
MCHC: 32.6 g/dL (ref 30.0–36.0)
MCV: 90.6 fL (ref 80.0–100.0)
Monocytes Absolute: 0.4 K/uL (ref 0.1–1.0)
Monocytes Relative: 7 %
Neutro Abs: 5 K/uL (ref 1.7–7.7)
Neutrophils Relative %: 79 %
Platelets: 276 K/uL (ref 150–400)
RBC: 3.52 MIL/uL — ABNORMAL LOW (ref 4.22–5.81)
RDW: 19 % — ABNORMAL HIGH (ref 11.5–15.5)
Smear Review: NORMAL
WBC: 6.3 K/uL (ref 4.0–10.5)
nRBC: 0.3 % — ABNORMAL HIGH (ref 0.0–0.2)

## 2024-04-30 LAB — BASIC METABOLIC PANEL WITH GFR
Anion gap: 6 (ref 5–15)
BUN: 22 mg/dL (ref 8–23)
CO2: 22 mmol/L (ref 22–32)
Calcium: 7.6 mg/dL — ABNORMAL LOW (ref 8.9–10.3)
Chloride: 106 mmol/L (ref 98–111)
Creatinine, Ser: 0.79 mg/dL (ref 0.61–1.24)
GFR, Estimated: >60 mL/min (ref >60)
Glucose, Bld: 109 mg/dL — ABNORMAL HIGH (ref 70–99)
Potassium: 3.1 mmol/L — ABNORMAL LOW (ref 3.5–5.1)
Sodium: 134 mmol/L — ABNORMAL LOW (ref 135–145)

## 2024-04-30 LAB — MAGNESIUM: Magnesium: 1.7 mg/dL (ref 1.7–2.4)

## 2024-04-30 LAB — GLUCOSE, CAPILLARY
Glucose-Capillary: 119 mg/dL — ABNORMAL HIGH (ref 70–99)
Glucose-Capillary: 180 mg/dL — ABNORMAL HIGH (ref 70–99)
Glucose-Capillary: 109 mg/dL — ABNORMAL HIGH (ref 70–99)
Glucose-Capillary: 137 mg/dL — ABNORMAL HIGH (ref 70–99)
Glucose-Capillary: 86 mg/dL (ref 70–99)

## 2024-04-30 LAB — BRAIN NATRIURETIC PEPTIDE: B Natriuretic Peptide: 1107.1 pg/mL — ABNORMAL HIGH (ref 0.0–100.0)

## 2024-04-30 LAB — APTT
aPTT: 45 s — ABNORMAL HIGH (ref 24–36)
aPTT: 72 s — ABNORMAL HIGH (ref 24–36)

## 2024-04-30 LAB — HEPARIN LEVEL (UNFRACTIONATED): Heparin Unfractionated: 0.95 [IU]/mL — ABNORMAL HIGH (ref 0.30–0.70)

## 2024-04-30 MED ORDER — POTASSIUM CHLORIDE 10 MEQ/100ML IV SOLN
10.0000 meq | INTRAVENOUS | Status: AC
  Administered 2024-04-30 (×3): 10 meq via INTRAVENOUS
  Filled 2024-04-30 (×3): qty 100

## 2024-04-30 MED ORDER — POLYETHYLENE GLYCOL 3350 17 G PO PACK
17.0000 g | PACK | Freq: Three times a day (TID) | ORAL | Status: DC
  Administered 2024-05-01 – 2024-05-04 (×12): 17 g via ORAL
  Filled 2024-04-30 (×13): qty 1

## 2024-04-30 MED ORDER — POLYETHYLENE GLYCOL 3350 17 G PO PACK: 17.0000 g | PACK | Freq: Two times a day (BID) | ORAL | Status: DC

## 2024-04-30 MED ORDER — FUROSEMIDE 10 MG/ML IJ SOLN
80.0000 mg | Freq: Once | INTRAMUSCULAR | Status: AC
  Administered 2024-04-30: 80 mg via INTRAVENOUS
  Filled 2024-04-30: qty 8

## 2024-04-30 MED ORDER — SPIRONOLACTONE 25 MG PO TABS
25.0000 mg | ORAL_TABLET | Freq: Every day | ORAL | Status: DC
  Administered 2024-04-30 – 2024-05-11 (×12): 25 mg via ORAL
  Filled 2024-04-30 (×12): qty 1

## 2024-04-30 NOTE — Progress Notes (Signed)
 PHARMACY - ANTICOAGULATION CONSULT NOTE  Pharmacy Consult for Heparin  (holding Apixaban ) Indication: atrial fibrillation  Allergies  Allergen Reactions   Codeine Nausea And Vomiting   Latex Rash    Oral blisters when used at dentist    Patient Measurements: Height: 6' (182.9 cm) Weight: 90.2 kg (198 lb 13.7 oz) IBW/kg (Calculated) : 77.6 HEPARIN  DW (KG): 85  Vital Signs: Temp: 98.4 F (36.9 C) (12/07 1119) Temp Source: Oral (12/07 1119) BP: 136/78 (12/07 1119) Pulse Rate: 95 (12/07 1119)  Labs: Recent Labs    04/28/24 0016 04/28/24 0016 04/28/24 1426 04/29/24 0334 04/29/24 1354 04/29/24 1823 04/30/24 0340 04/30/24 1317  HGB 12.2*  --   --  10.9*  --   --  10.4*  --   HCT 37.7*  --   --  32.7*  --   --  31.9*  --   PLT 373  --   --  290  --   --  276  --   APTT  --    < > 41*  --   --  46* 45* 72*  HEPARINUNFRC  --   --  >1.10*  --   --  >1.10* 0.95*  --   CREATININE 0.96  --   --  0.95 0.89  --  0.79  --    < > = values in this interval not displayed.    Estimated Creatinine Clearance: 86.2 mL/min (by C-G formula based on SCr of 0.79 mg/dL).  Assessment: 78 y/o M on with increasingly distended abdomen, on apixaban  PTA for afib, holding apixaban  and starting heparin  in case any procedures are needed, last dose of apixaban  was 12/2 around 1930. Continue using aPTT to dose for now. Pharmacy consulted to manage heparin .  aPTT is therapeutic this afternoon at 72 on 1700 units/hr.  Of note: aPTT was previously therapeutic on 1300 units/hr. No issues with infusion or bleeding reported. CBC stable.   Goal of Therapy:  Heparin  level 0.3-0.7 units/ml aPTT 66-102 seconds Monitor platelets by anticoagulation protocol: Yes   Plan:  Continue heparin  at 1700 units/hr Check aPTT 8hrs Monitor with aPTTs until correlates with heparin  level CBC daily   Thank you for allowing pharmacy to be a part of this patient's care.   Bascom JAYSON Louder, PharmD 04/30/2024 2:51  PM  **Pharmacist phone directory can be found on [email or web address] listed under [facility]**

## 2024-04-30 NOTE — Progress Notes (Addendum)
 PROGRESS NOTE  Chase Combs FMW:[id number] DOB: 10-12-1945 DOA: 04/21/2024 PCP: Wonda Worth SQUIBB, PA  HPI/Recap of past 24 hours: 78 y.o. 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 Of note, patient had recent complicated admission for septic shock, cardiogenic shock, intestinal pneumatosis/ileus/pseudoobstruction/Ogilvie syndrome. Now admitted for recurrent Ogilvie syndrome, suspected gastroenterocolitis, AKI on CKD, hypotension and acute debilitation.       Today, pt still dyspneic, lethargic and very deconditioned. Pt abdomen slowly improving, feels softer, but still distended although improving, still passing liquid stool, tolerating orally.    Assessment/Plan: Principal Problem:   Enterocolitis Active Problems:   Ogilvie syndrome   Chronic systolic CHF (congestive heart failure) (HCC)   Hypokalemia   HLD (hyperlipidemia)   Acute kidney injury   DM2 (diabetes mellitus, type 2) (HCC)   Postural dizziness with presyncope   Ileus, unspecified (HCC)   Elevated troponin   Fall at home, initial encounter   Hypotension   Paroxysmal atrial flutter (HCC)   Anemia of chronic disease   Adynamic ileus (HCC)   Chronic combined systolic and diastolic congestive heart failure (HCC)    Diffuse progressive ileus Recurrent Ogilvie Syndrome History of recent hospitalization (01/22/24-02/25/24) for similar GI symptoms with extensive workup. Underwent colonic decompression (02/18/24) with  improvement Currently afebrile, with no leukocytosis C diff and enteric PCR panel negative CT A/P w/o contrast with noted diffuse mild dilatation of colon, distended ileal loops and stomach Repeat abdominal x-ray on 12/3 showed diffuse ileus involving colon and small bowel GI consulted, appreciate recs, continue MiraLAX , rectal tube, high risk for complications with sedation for colonic decompression Continue soft diet Aspiration precautions Monitor closely  Acute hypoxic respiratory failure Currently requiring about 4 L of O2, dyspneic, tachypenic ABG showed pH 7.37, pO2 75, CO2 40 D-dimer mildly elevated Procalcitonin 0.26 Likely in the setting of abdominal distension from underlying ileus/pseudoobstruction/gastroenterocolitis in addition to advanced CHF (required IV fluid resuscitation due to AKI) CTA chest showing no PE, but new and increased bilateral ground glass opacity, airway thickening which may reflect atypical pneumonia, acute eosinophilic pneumonia, hypersensitivity pneumonitis, pulmonary edema or pulmonary hemorrhage, noted mucus in the right mainstem bronchus PCCM consulted Continue IV antibiotics, DuoNebs, nebulizers Supplemental O2 as needed  CHFrEF Ischemic cardiomyopathy Recent prolonged hospitalization complicated by cardiogenic shock with acute CHF exacerbation in the setting of LAD stent stenosis  BNP 1984 Last echo 01/2024 with LVEF 20-25% CXR 12/2 showed bilateral opacities Cardiology consulted, appreciate recs Restart lasix  40 mg daily, hold spironolactone , entresto , digoxin , Jardiance  Received 1 dose of IV lasix  80 mg once on 12/7 by cardiology Monitor I/Os, daily weights, renal function  Paroxysmal Afib/flutter with RVR Heart rate improved S/p TEE CV on 02/21/2024 Cardiology on board, appreciate recs Continue IV amiodarone , IV heparin  Eliquis  held Telemetry  Gross hematuria Resolved Noted hematuria on 12/4 Renal ultrasound showed enlarged prostate  protruding into the inferior bladder, layering debris's in the bladder could reflect blood clot Urology consulted, placed foley on 12/5 Continue flomax  Monitor closely while on IV heparin   CAD s/p DES to LAD 8/25 Hyperlipidemia Recent balloon angioplasty  of LAD stent thrombosis (02/01/2024) due to inability to tolerate oral DAPT  History of remote PCI to RI and RCA EKG with known RBBB, LAFB, no significant changes from prior Troponin 67 < 71 Cardiology consulted, appreciate recs Cont home plavix , held Eliquis , continue Crestor  Telemetry  Hypotension BP soft Likely in the setting of dehydration/orthostatic hypotension from possible ileus/pseudoobstruction/gastroenterocolitis CT head unremarkable S/p IV fluids Continue to hold BP meds  Hypokalemia/hypomagnesemia Likely in the setting of ileus, frequent diarrhea Monitor and replete electrolytes as needed   AKI on CKD Resolved Cr 1.11 < 2.1, baseline 0.9-1.5 Likely in the setting of dehydration from diarrhea CT A/P with no concern for obstructive disease Holding home entresto , spironolactone  Avoid nephrotoxic agents Renal dose medications Monitor BMP   Peptic ulcer disease Recent hospitalization with EGD (02/02/24) showing esophageal ulcer without bleeding, grade C reflux esophagitis without bleeding, duodenal erosions Cont pantoprazole    T2DM Hold home Jardiance  Cont SSI   Incidental R thyroid  nodule Noted on CT C spine TSH normal Will need outpatient thyroid  US    Chronic normocytic anemia Hgb 11.2, baseline 10-12 Daily CBC   Acute debilitation Mechanical fall Monitor on fall precautions PT/OT following- rec SNF  GOC discussion Patient with poor prognosis, recurrent severe ileus, advanced CHF Palliative care consulted, appreciate recs Currently DNR   Malnutrition Type:  Nutrition Problem: Moderate Malnutrition Etiology: chronic illness   Malnutrition Characteristics:  Signs/Symptoms: moderate fat  depletion, severe muscle depletion, moderate muscle depletion   Nutrition Interventions:  Interventions: Refer to RD note for recommendations    Estimated body mass index is 26.97 kg/m as calculated from the following:   Height as of this encounter: 6' (1.829 m).   Weight as of this encounter: 90.2 kg.     Code Status: DNR  Family Communication: Discussed with wife over the phone on 12/5  Disposition Plan: Status is: Inpatient Remains inpatient appropriate because: Level of care      Consultants: GI Cardiology PCCM Urology  Procedures: None  Antimicrobials: Cefepime  Azithromycin   DVT prophylaxis: IV heparin    Objective: Vitals:   04/30/24 0524 04/30/24 0528 04/30/24 0734 04/30/24 1119  BP:   123/84 136/78  Pulse:  (!) 121 79 95  Resp:  (!) 29 (!) 30 20  Temp:   97.9 F (36.6 C) 98.4 F (36.9 C)  TempSrc:   Oral Oral  SpO2: 100% 100% 99% 100%  Weight:  90.2 kg    Height:        Intake/Output Summary (Last 24 hours) at 04/30/2024 1435 Last data filed at 04/30/2024 1126 Gross per 24 hour  Intake 1401.57 ml  Output 800 ml  Net 601.57 ml   Filed Weights   04/28/24 0401 04/29/24 0420 04/30/24 0528  Weight: 88 kg 89.4 kg 90.2 kg    Exam: General: NAD, awake/alert, lethargic Cardiovascular: S1, S2 present Respiratory: Diminished BS bilaterally Abdomen: Soft, nontender, +distended, bowel sounds present Musculoskeletal: No bilateral pedal edema noted Skin: Normal Psychiatry: fair mood     Data Reviewed: CBC: Recent Labs  Lab 04/25/24 2339 04/27/24 0353 04/28/24 0016 04/29/24 0334 04/30/24 0340  WBC 8.0 7.9 8.2 6.0 6.3  NEUTROABS 5.6 5.7 6.2 5.6 5.0  HGB 12.6* 11.9* 12.2* 10.9* 10.4*  HCT 39.7 37.1* 37.7* 32.7* 31.9*  MCV 92.8 92.8 91.3 90.1 90.6  PLT 378 365 373 290 276   Basic Metabolic Panel: Recent Labs  Lab 04/24/24 0653 04/24/24 1756 04/25/24 0535 04/25/24 0745 04/25/24 2339 04/26/24 9265 04/27/24 0353 04/28/24 0016  04/29/24 9665  04/29/24 1354 04/30/24 0340 04/30/24 0756  NA 134*   < >  --    < > 136   < > 138 138 134* 135 134*  --   K 2.9*   < >  --    < > 4.3   < > 4.2 3.7 2.6* 3.5 3.1*  --   CL 103   < >  --    < > 110   < > 107 105 106 107 106  --   CO2 20*   < >  --    < > 22   < > 24 23 19* 19* 22  --   GLUCOSE 88   < >  --    < > 169*   < > 134* 110* 149* 143* 109*  --   BUN 70*   < >  --    < > 45*   < > 41* 34* 33* 29* 22  --   CREATININE 1.53*   < >  --    < > 1.09   < > 1.03 0.96 0.95 0.89 0.79  --   CALCIUM  8.2*   < >  --    < > 8.4*   < > 8.3* 8.2* 7.9* 7.9* 7.6*  --   MG 2.1  --  2.2  --  2.3  --   --   --  2.0  --   --  1.7   < > = values in this interval not displayed.   GFR: Estimated Creatinine Clearance: 86.2 mL/min (by C-G formula based on SCr of 0.79 mg/dL). Liver Function Tests: No results for input(s): AST, ALT, ALKPHOS, BILITOT, PROT, ALBUMIN in the last 168 hours.  No results for input(s): LIPASE, AMYLASE in the last 168 hours. No results for input(s): AMMONIA in the last 168 hours. Coagulation Profile: No results for input(s): INR, PROTIME in the last 168 hours.  Cardiac Enzymes: No results for input(s): CKTOTAL, CKMB, CKMBINDEX, TROPONINI in the last 168 hours. BNP (last 3 results) Recent Labs    01/07/24 2301  PROBNP 15,245.0*   HbA1C: No results for input(s): HGBA1C in the last 72 hours. CBG: Recent Labs  Lab 04/29/24 2001 04/29/24 2356 04/30/24 0409 04/30/24 0735 04/30/24 1206  GLUCAP 129* 113* 119* 180* 109*   Lipid Profile: No results for input(s): CHOL, HDL, LDLCALC, TRIG, CHOLHDL, LDLDIRECT in the last 72 hours. Thyroid  Function Tests: No results for input(s): TSH, T4TOTAL, FREET4, T3FREE, THYROIDAB in the last 72 hours. Anemia Panel: No results for input(s): VITAMINB12, FOLATE, FERRITIN, TIBC, IRON, RETICCTPCT in the last 72 hours. Urine analysis:    Component Value Date/Time    COLORURINE YELLOW 04/22/2024 1205   APPEARANCEUR CLEAR 04/22/2024 1205   LABSPEC >1.030 (H) 04/22/2024 1205   PHURINE 5.5 04/22/2024 1205   GLUCOSEU NEGATIVE 04/22/2024 1205   HGBUR NEGATIVE 04/22/2024 1205   BILIRUBINUR NEGATIVE 04/22/2024 1205   KETONESUR NEGATIVE 04/22/2024 1205   PROTEINUR NEGATIVE 04/22/2024 1205   UROBILINOGEN 0.2 02/17/2009 0645   NITRITE NEGATIVE 04/22/2024 1205   LEUKOCYTESUR NEGATIVE 04/22/2024 1205   Sepsis Labs: @LABRCNTIP (procalcitonin:4,lacticidven:4)  ) Recent Results (from the past 240 hours)  Resp panel by RT-PCR (RSV, Flu A&B, Covid) Anterior Nasal Swab     Status: None   Collection Time: 04/21/24  7:51 PM   Specimen: Anterior Nasal Swab  Result Value Ref Range Status   SARS Coronavirus 2 by RT PCR NEGATIVE NEGATIVE Final    Comment: (NOTE) SARS-CoV-2  target nucleic acids are NOT DETECTED.  The SARS-CoV-2 RNA is generally detectable in upper respiratory specimens during the acute phase of infection. The lowest concentration of SARS-CoV-2 viral copies this assay can detect is 138 copies/mL. A negative result does not preclude SARS-Cov-2 infection and should not be used as the sole basis for treatment or other patient management decisions. A negative result may occur with  improper specimen collection/handling, submission of specimen other than nasopharyngeal swab, presence of viral mutation(s) within the areas targeted by this assay, and inadequate number of viral copies(<138 copies/mL). A negative result must be combined with clinical observations, patient history, and epidemiological information. The expected result is Negative.  Fact Sheet for Patients:  bloggercourse.com  Fact Sheet for Healthcare Providers:  seriousbroker.it  This test is no t yet approved or cleared by the [place]  FDA and  has been authorized for detection and/or diagnosis of SARS-CoV-2 by FDA under an  Emergency Use Authorization (EUA). This EUA will remain  in effect (meaning this test can be used) for the duration of the COVID-19 declaration under Section 564(b)(1) of the Act, 21 U.S.C.section 360bbb-3(b)(1), unless the authorization is terminated  or revoked sooner.       Influenza A by PCR NEGATIVE NEGATIVE Final   Influenza B by PCR NEGATIVE NEGATIVE Final    Comment: (NOTE) The Xpert Xpress SARS-CoV-2/FLU/RSV plus assay is intended as an aid in the diagnosis of influenza from Nasopharyngeal swab specimens and should not be used as a sole basis for treatment. Nasal washings and aspirates are unacceptable for Xpert Xpress SARS-CoV-2/FLU/RSV testing.  Fact Sheet for Patients: bloggercourse.com  Fact Sheet for Healthcare Providers: seriousbroker.it  This test is not yet approved or cleared by the [place]  FDA and has been authorized for detection and/or diagnosis of SARS-CoV-2 by FDA under an Emergency Use Authorization (EUA). This EUA will remain in effect (meaning this test can be used) for the duration of the COVID-19 declaration under Section 564(b)(1) of the Act, 21 U.S.C. section 360bbb-3(b)(1), unless the authorization is terminated or revoked.     Resp Syncytial Virus by PCR NEGATIVE NEGATIVE Final    Comment: (NOTE) Fact Sheet for Patients: bloggercourse.com  Fact Sheet for Healthcare Providers: seriousbroker.it  This test is not yet approved or cleared by the [place]  FDA and has been authorized for detection and/or diagnosis of SARS-CoV-2 by FDA under an Emergency Use Authorization (EUA). This EUA will remain in effect (meaning this test can be used) for the duration of the COVID-19 declaration under Section 564(b)(1) of the Act, 21 U.S.C. section 360bbb-3(b)(1), unless the authorization is terminated or revoked.  Performed at [facility], [address], [place], [place] [address]   C Difficile Quick Screen w PCR reflex     Status: None   Collection Time: 04/22/24  6:33 PM   Specimen: STOOL  Result Value Ref Range Status   C Diff antigen NEGATIVE NEGATIVE Final   C Diff toxin NEGATIVE NEGATIVE Final   C Diff interpretation No C. difficile detected.  Final    Comment: Performed at [facility], [address] [place], [place] [address]  Gastrointestinal Panel by PCR , Stool     Status: None   Collection Time: 04/22/24  6:33 PM   Specimen: Stool  Result Value Ref Range Status   Campylobacter species NOT DETECTED NOT DETECTED Final   Plesimonas shigelloides NOT DETECTED NOT DETECTED Final   Salmonella species NOT DETECTED NOT DETECTED Final  Yersinia enterocolitica NOT DETECTED NOT DETECTED Final   Vibrio species NOT DETECTED NOT DETECTED Final   Vibrio cholerae NOT DETECTED NOT DETECTED Final   Enteroaggregative E coli (EAEC) NOT DETECTED NOT DETECTED Final   Enteropathogenic E coli (EPEC) NOT DETECTED NOT DETECTED Final   Enterotoxigenic E coli (ETEC) NOT DETECTED NOT DETECTED Final   Shiga like toxin producing E coli (STEC) NOT DETECTED NOT DETECTED Final   Shigella/Enteroinvasive E coli (EIEC) NOT DETECTED NOT DETECTED Final   Cryptosporidium NOT DETECTED NOT DETECTED Final   Cyclospora cayetanensis NOT DETECTED NOT DETECTED Final   Entamoeba histolytica NOT DETECTED NOT DETECTED Final   Giardia lamblia NOT DETECTED NOT DETECTED Final   Adenovirus F40/41 NOT DETECTED NOT DETECTED Final   Astrovirus NOT DETECTED NOT DETECTED Final   Norovirus GI/GII NOT DETECTED NOT DETECTED Final   Rotavirus A NOT DETECTED NOT DETECTED Final   Sapovirus (I, II, IV, and V) NOT DETECTED NOT DETECTED Final    Comment: Performed at [facility], [address] [place], [place] [address]  Respiratory (~20 pathogens) panel by PCR     Status: None   Collection Time: 04/28/24  6:06 PM   Specimen:  Nasopharyngeal Swab; Respiratory  Result Value Ref Range Status   Adenovirus NOT DETECTED NOT DETECTED Final   Coronavirus 229E NOT DETECTED NOT DETECTED Final    Comment: (NOTE) The Coronavirus on the Respiratory Panel, DOES NOT test for the novel  Coronavirus (2019 nCoV)    Coronavirus HKU1 NOT DETECTED NOT DETECTED Final   Coronavirus NL63 NOT DETECTED NOT DETECTED Final   Coronavirus OC43 NOT DETECTED NOT DETECTED Final   Metapneumovirus NOT DETECTED NOT DETECTED Final   Rhinovirus / Enterovirus NOT DETECTED NOT DETECTED Final   Influenza A NOT DETECTED NOT DETECTED Final   Influenza B NOT DETECTED NOT DETECTED Final   Parainfluenza Virus 1 NOT DETECTED NOT DETECTED Final   Parainfluenza Virus 2 NOT DETECTED NOT DETECTED Final   Parainfluenza Virus 3 NOT DETECTED NOT DETECTED Final   Parainfluenza Virus 4 NOT DETECTED NOT DETECTED Final   Respiratory Syncytial Virus NOT DETECTED NOT DETECTED Final   Bordetella pertussis NOT DETECTED NOT DETECTED Final   Bordetella Parapertussis NOT DETECTED NOT DETECTED Final   Chlamydophila pneumoniae NOT DETECTED NOT DETECTED Final   Mycoplasma pneumoniae NOT DETECTED NOT DETECTED Final    Comment: Performed at [facility], [address], [place], [place] [address]  MRSA Next Gen by PCR, Nasal     Status: None   Collection Time: 04/29/24  4:31 PM   Specimen: Nasal Mucosa; Nasal Swab  Result Value Ref Range Status   MRSA by PCR Next Gen NOT DETECTED NOT DETECTED Final    Comment: (NOTE) The GeneXpert MRSA Assay (FDA approved for NASAL specimens only), is one component of a comprehensive MRSA colonization surveillance program. It is not intended to diagnose MRSA infection nor to guide or monitor treatment for MRSA infections. Test performance is not FDA approved in patients less than 73 years old. Performed at [facility], [address], [place], [place] [address]       Studies: No results found.    Scheduled Meds:   acetylcysteine   2 mL Nebulization TID   azithromycin   500 mg Oral Daily   budesonide  (PULMICORT ) nebulizer solution  0.25 mg Nebulization BID   Chlorhexidine  Gluconate Cloth  6 each Topical Daily   clopidogrel   75 mg Oral Daily   feeding supplement  1 Container Oral TID BM   fiber supplement (BANATROL TF)  60 mL Oral BID   furosemide   80 mg Intravenous Once   furosemide   40 mg Oral Daily   insulin  aspart  0-6 Units Subcutaneous Q4H   ipratropium  0.5 mg Nebulization Q8H   lidocaine   1 Application Urethral Once   pantoprazole  (PROTONIX ) IV  40 mg Intravenous Q24H   polyethylene glycol  17 g Oral TID WC   revefenacin   175 mcg Nebulization Daily   rosuvastatin   20 mg Oral Daily   spironolactone   25 mg Oral Daily   tamsulosin   0.4 mg Oral QPC supper    Continuous Infusions:  amiodarone  30 mg/hr (04/30/24 0359)   ceFEPime  (MAXIPIME ) IV 2 g (04/30/24 0752)   heparin  1,700 Units/hr (04/30/24 0506)     LOS: 8 days     Lebron JINNY Cage, MD [facility]  If 7PM-7AM, please contact night-coverage [email or web address] 04/30/2024, 2:35 PM

## 2024-04-30 NOTE — Progress Notes (Signed)
 PHARMACY - ANTICOAGULATION CONSULT NOTE  Pharmacy Consult for Heparin  (holding Apixaban ) Indication: atrial fibrillation  Allergies  Allergen Reactions   Codeine Nausea And Vomiting   Latex Rash    Oral blisters when used at dentist    Patient Measurements: Height: 6' (182.9 cm) Weight: 89.4 kg (197 lb 1.5 oz) IBW/kg (Calculated) : 77.6 HEPARIN  DW (KG): 85  Vital Signs: Temp: 98 F (36.7 C) (12/07 0400) Temp Source: Oral (12/07 0400) BP: 106/61 (12/07 0400) Pulse Rate: 124 (12/07 0400)  Labs: Recent Labs    04/28/24 0016 04/28/24 1426 04/29/24 0334 04/29/24 1354 04/29/24 1823 04/30/24 0340  HGB 12.2*  --  10.9*  --   --  10.4*  HCT 37.7*  --  32.7*  --   --  31.9*  PLT 373  --  290  --   --  276  APTT  --  41*  --   --  46* 45*  HEPARINUNFRC  --  >1.10*  --   --  >1.10* 0.95*  CREATININE 0.96  --  0.95 0.89  --   --     Estimated Creatinine Clearance: 77.5 mL/min (by C-G formula based on SCr of 0.89 mg/dL).  Assessment: 78 y/o M on with increasingly distended abdomen, on apixaban  PTA for afib, holding apixaban  and starting heparin  in case any procedures are needed, last dose of apixaban  was 12/2 around 1930. Continue using aPTT to dose for now. Pharmacy consulted to manage heparin .  Resuming Heparin  again this AM after being held for hematuria   AM aPTT still is subtherapeutic on 1450 units/hr. aPTT was previously therapeutic on 1300 units/hr. Per RN, no pauses/interruptions to the infusion, no issues with IV site. No bleeding reported. CBC stable. Heparin  level still falsely elevated given recent apixaban  use.  Goal of Therapy:  Heparin  level 0.3-0.7 units/ml aPTT 66-102 seconds Monitor platelets by anticoagulation protocol: Yes   Plan:  Increase heparin  gtt to 1700 units/hr Check aPTT 8hrs Monitor with aPTTs until correlates with heparin  level CBC daily  Thank you for allowing pharmacy to be a part of this patient's care.   Lynwood Poplar, PharmD,  BCPS Clinical Pharmacist 04/30/2024 4:53 AM

## 2024-04-30 NOTE — Progress Notes (Addendum)
 Patient ID: Chase Combs, male   DOB: [birth year], 78 y.o.   MRN: [id number]    Progress Note   Subjective   Day # 9 CC; recurrent colonic ileus/diffuse send patient with severe ischemic cardiomyopathy, acute on chronic congestive heart failure/atrial fibs/flutter  Relistor  on 12/4 and 12/5 MiraLAX  3 times daily  CT angio chest 04/28/2024 no PE, new increased bilateral ground glass opacities bandlike components volume loss greatest in the right lower lobe and mild bilateral airway thickening may reflect atypical pneumonia, acute eosinophilic pneumonia, pneumonitis pulmonary edema or pulmonary hemorrhage  Labs today-WBC 6.3/hemoglobin 10.4/ crit 31.9 Sodium 134/potassium 3.1/BUN 22/creatinine 0.79 BNP 1007.1  Was seen earlier this morning, looked brighter today, says his abdomen is feeling better and said that he had multiple loose to liquid bowel movements overnight.  Willing to try some solid food, no nausea or vomiting, no current complaints of abdominal pain Patient says breathing about the same     Objective   Vital signs in last 24 hours: Temp:  [97.9 F (36.6 C)-98.4 F (36.9 C)] 98.4 F (36.9 C) (12/07 1119) Pulse Rate:  [62-124] 95 (12/07 1119) Resp:  [20-32] 20 (12/07 1119) BP: (89-136)/(61-84) 136/78 (12/07 1119) SpO2:  [97 %-100 %] 100 % (12/07 1119) FiO2 (%):  [28 %-36 %] 36 % (12/07 0524) Weight:  [90.2 kg] 90.2 kg (12/07 0528) Last BM Date : 04/29/24 General:   Elderly white male in NAD on O2 at 4 L Heart:  Regular rate and rhythm; no murmurs Lungs: Respirations even and unlabored, at present, scattered rhonchi bilaterally Abdomen: Definitely significantly softer than when last examined, no tympany, bowel sounds more normal active, no focal tenderness Extremities:  Without edema. Neurologic:  Alert and oriented,  grossly normal neurologically. Psych:  Cooperative. Normal mood and affect.  Intake/Output from previous day: 12/06 0701 - 12/07 0700 In: 1521.6  [P.O.:1320; I.V.:201.6] Out: 800 [Urine:800] Intake/Output this shift: No intake/output data recorded.  Lab Results: Recent Labs    04/28/24 0016 04/29/24 0334 04/30/24 0340  WBC 8.2 6.0 6.3  HGB 12.2* 10.9* 10.4*  HCT 37.7* 32.7* 31.9*  PLT 373 290 276   BMET Recent Labs    04/29/24 0334 04/29/24 1354 04/30/24 0340  NA 134* 135 134*  K 2.6* 3.5 3.1*  CL 106 107 106  CO2 19* 19* 22  GLUCOSE 149* 143* 109*  BUN 33* 29* 22  CREATININE 0.95 0.89 0.79  CALCIUM  7.9* 7.9* 7.6*   LFT No results for input(s): PROT, ALBUMIN, AST, ALT, ALKPHOS, BILITOT, BILIDIR, IBILI in the last 72 hours. PT/INR No results for input(s): LABPROT, INR in the last 72 hours.  Studies/Results: US  RENAL Result Date: 04/28/2024 EXAM: US  Retroperitoneum Complete, Renal. 04/28/2024 01:10:00 PM TECHNIQUE: Real-time ultrasonography of the retroperitoneum renal was performed. COMPARISON: US  Renal 12/22/2006 and CT abdomen performed 04/21/2024. CLINICAL HISTORY: Hematuria; Difficulty voiding. FINDINGS: FINDINGS: RIGHT KIDNEY/URETER: Right kidney measures 10.7 x 6.9 x 5.6 cm. Normal cortical echogenicity. No hydronephrosis. No calculus. No mass. Exophytic cyst off the mid pole measures up to 2.9 cm and appears simple. Posterior right mid pole cyst measures up to 4.5 cm and appears simple. LEFT KIDNEY/URETER: Left kidney measures 13.7 x 8.5 x 4.6 cm. Normal cortical echogenicity. No hydronephrosis. No calculus. No mass. 2 small left renal cysts appear benign, and measure up to 2 cm. BLADDER: Enlarged prostate protruding into the inferior bladder. Layering debris in the bladder could reflect blood clot. IMPRESSION: 1. Layering debris in the bladder possibly representing blood  clot. 2. Enlarged prostate protruding into the inferior bladder. Electronically signed by: Franky Crease MD 04/28/2024 05:14 PM EST RP Workstation: [id number]   DG Abd 1 View Result Date: 04/28/2024 CLINICAL DATA:  Abdominal  distension.  Ileus. EXAM: ABDOMEN - 1 VIEW COMPARISON:  Radiograph 04/26/2024, CT 04/21/2024 FINDINGS: Diffuse colonic distension, without significant change. Decreasing gaseous small bowel distension centrally. There is excreted IV contrast in the renal collecting systems and ureters. No obvious free air. IMPRESSION: Diffuse colonic distension, without significant change. Decreasing gaseous small bowel distension centrally. Electronically Signed   By: Andrea Gasman M.D.   On: 04/28/2024 13:12       Assessment / Plan:    #2   78 year old white male with severe ischemic cardiomyopathy/EF 20 to 25%, coronary artery disease status post MI August 2025 with new LAD stent, complicated postoperative course with prolonged ileus and ischemic colitis. He failed Relistor  and neostigmine  during that admission and eventually required colonic decompression with improvement in symptoms.  Admitted now over the past week after presenting with presyncope and hypotension after being ill at home with nausea vomiting, diarrhea then recurrent abdominal distention.  Again has had a persistent diffuse ileus predominantly colonic  His cardiopulmonary status has been quite tenuous, and not felt to be a candidate for sedation for decompression at this time.  Very hesitant to use neostigmine  given his arrhythmias and intermittent bradycardia  Have been managing with Relistor  8 mg-has had 2-3 doses. MiraLAX  3 times daily Intermittent enemas as needed  He has finally had multiple loose bowel movements and abdomen much softer today.  Ileus much improved.  #2 acute on chronic hypoxic respiratory failure with multifocal pneumonia-pulmonary following  Plan; okay to try soft diet will advance He is not on any scheduled Relistor , no plans for continuing Relistor  at this time MiraLAX  three times daily Please do not start him on Imodium, allow bowel to purge  Overall prognosis poor given his multiple severe  comorbidities. GI will sign off, please call for questions or problems   Principal Problem:   Enterocolitis Active Problems:   HLD (hyperlipidemia)   Postural dizziness with presyncope   Hypokalemia   Chronic systolic CHF (congestive heart failure) (HCC)   Ogilvie syndrome   Ileus, unspecified (HCC)   Acute kidney injury   DM2 (diabetes mellitus, type 2) (HCC)   Elevated troponin   Fall at home, initial encounter   Hypotension   Paroxysmal atrial flutter (HCC)   Anemia of chronic disease   Adynamic ileus (HCC)   Chronic combined systolic and diastolic congestive heart failure (HCC)     LOS: 8 days   Amy Esterwood PA-C 04/30/2024, 12:26 PM   Attending physician's note   I personally saw the patient and performed a substantive portion of the medical decision making process for this encounter (including a complete performance of the key components : MDM, Hx and Exam), in conjunction with the APP.  I agree with the APP's note, impression, and  the management plan for the number and complexity of problems addressed at the encounter for the patient and take responsibility for that plan with its inherent risk of complications, morbidity, or mortality with additional input as follows.     Abdominal distention improving.  He continues to have bowel movements  On exam abdomen is distended, tympanic but soft with no significant tenderness  Continue current bowel regimen with Relistor  daily, MiraLAX  1 capful 3 times daily Can hold enema if having good stool output Turn in bed  and ambulate out of bed as tolerated  GI signing off, please call with any questions  I have spent >35 minutes of patient care (this includes precharting, chart review, review of results, face-to-face time used for counseling as well as treatment plan and follow-up. The patient was provided an opportunity to ask questions and all were answered. The patient agreed with the plan and demonstrated an understanding of  the instructions.   LOIS Wilkie Mcgee , MD [phone]

## 2024-04-30 NOTE — Progress Notes (Signed)
 NAME:  Chase Combs, MRN:  [id number], DOB:  11-10-1945, LOS: 8 ADMISSION DATE:  04/21/2024, CONSULTATION DATE:  12/5 REFERRING MD:  Donnamarie, CHIEF COMPLAINT:  resp failure    History of Present Illness:  78 year old male patient with extensive medical history including ischemic cardiomyopathy with EF 20 to 25%, chronic atrial fibrillation, coronary artery disease, atrial fibrillation, and recurrent acute colonic pseudo obstruction Presented to the emergency room on 11/29 with episode of presyncope after 1 to 2 days of nausea vomiting and diarrhea.  In ER initially found to be hypotensive, responding initially to IV fluid hydration, had acute on chronic renal failure, and CT of abdomen and pelvis showed diffuse mild dilation of the colon without a transition point suggesting colitis versus ileus.  Was admitted to the medical team, gastroenterology services were consulted for progressive ileus/Ogilvie syndrome, who also felt likely the recurrent pseudoobstruction may also be exacerbated by chronic low flow state from his congestive heart failure.  Cardiology was consulted for ongoing atrial fibrillation initially.  Therapeutic interventions in regards to the Ogilvie syndrome has included initially IV hydration, antiemetics, and tapwater enemas.  From a cardiology standpoint his paroxysmal atrial fibrillation was treated with IV amiodarone .  On 12/3 noted to have increased abdominal tightness Worsening abdominal discomfort.  Mild increased need and FiO2.  On 12/4 began to report increased shortness of breath, and simply said just felt worn out, noted to be a bit more lethargic compared to prior exams.  IV fluids were discontinued on 12/5 continued to have significant lethargy, ongoing oxygen need, and worsening work of breathing and therefore a CT angiogram was obtained this demonstrated diffuse bilateral patchy airspace disease thickened esophagus and mucus in the right mainstem bronchus with increased  colonic distention and air-fluid level.  Because of ongoing hypoxia and CT findings pulmonary was asked to evaluate  Pertinent  Medical History  Ischemic cardiomyopathy with a EF 20 to 25%, coronary artery disease with prior stenting to the LAD in August 2025, atrial fibrillation, hyperlipidemia, hypertension type 2 diabetes, prostate cancer status post radiation 2023, recurrent acute colonic pseudoobstruction  Significant Hospital Events: Including procedures, antibiotic start and stop dates in addition to other pertinent events   11/29 Admitted  12/5 PCCM consulted for hypoxia 12/7 no acute issues overnight, remains on 4L [place]   Interim History / Subjective:  State he feels well and was able to produce some sputum   Objective    Blood pressure 123/84, pulse 79, temperature 97.9 F (36.6 C), temperature source Oral, resp. rate (!) 30, height 6' (1.829 m), weight 90.2 kg, SpO2 99%.    FiO2 (%):  [28 %-36 %] 36 %   Intake/Output Summary (Last 24 hours) at 04/30/2024 0820 Last data filed at 04/29/2024 1800 Gross per 24 hour  Intake 1161.57 ml  Output 800 ml  Net 361.57 ml   Filed Weights   04/28/24 0401 04/29/24 0420 04/30/24 0528  Weight: 88 kg 89.4 kg 90.2 kg    Examination: General: Acute on chronic ill-appearing malesitting up in bed in no acute distress HEENT: /AT, MM pink/moist, PERRL,  Neuro: Alert and oriented x 3, CV: s1s2 regular rate and rhythm, no murmur, rubs, or gallops,  PULM: Diminished with slight rhonchi bilaterally, on 4 L nasal cannula no increased work of breathing GI: soft, bowel sounds active in all 4 quadrants, non-tender, non-distended, tolerating oral diet Extremities: warm/dry, no edema  Skin: no rashes or lesions  Resolved problem list   Assessment and Plan  Acute hypoxic respiratory failure from multifocal pneumonia Acute colonic pseudo-obstruction -Ct chest shows new GGO b/l and mucous plug in right mainstem. Has some scarring visible on  prior ct abd in lower lobes but not as prominent then -Broad differentials- aspiration pneumonia/pneumonitis, viral pneumonia, less likely pulm edema, other rare etiologies like eosinophilic pneumonia, HP, pulm hemorrhage, etc. clinical presentation and imaging indicates likely aspiration event with deconditioning leading to mucous plugging P: Continue cefepime  and azithromycin  Scheduled Mucomyst  Scheduled bronchodilators Aggressive pulmonary hygiene Antibiotics  Critical care time: N/A  Oree Hislop D. Harris, NP-C [facility] Personal contact information can be found on Amion  If no contact or response made please call [phone] 04/30/2024, 8:21 AM

## 2024-04-30 NOTE — Progress Notes (Signed)
 Rounding Note   Patient Name: Chase Combs Date of Encounter: 04/30/2024  [facility] Cardiologist: Alm Clay, MD  AHF: Dr. Zenaida  Subjective C/o his chest being a little heavier. A little more sob.   Scheduled Meds:  acetylcysteine   2 mL Nebulization TID   azithromycin   500 mg Oral Daily   budesonide  (PULMICORT ) nebulizer solution  0.25 mg Nebulization BID   Chlorhexidine  Gluconate Cloth  6 each Topical Daily   clopidogrel   75 mg Oral Daily   feeding supplement  1 Container Oral TID BM   fiber supplement (BANATROL TF)  60 mL Oral BID   furosemide   40 mg Oral Daily   insulin  aspart  0-6 Units Subcutaneous Q4H   ipratropium  0.5 mg Nebulization Q8H   lidocaine   1 Application Urethral Once   pantoprazole  (PROTONIX ) IV  40 mg Intravenous Q24H   polyethylene glycol  17 g Oral TID BM   revefenacin   175 mcg Nebulization Daily   rosuvastatin   20 mg Oral Daily   spironolactone   25 mg Oral Daily   tamsulosin   0.4 mg Oral QPC supper   Continuous Infusions:  amiodarone  30 mg/hr (04/30/24 0359)   ceFEPime  (MAXIPIME ) IV 2 g (04/30/24 0752)   heparin  1,700 Units/hr (04/30/24 0506)   potassium chloride  10 mEq (04/30/24 1126)   PRN Meds: acetaminophen  **OR** acetaminophen , ondansetron  (ZOFRAN ) IV   Vital Signs  Vitals:   04/30/24 0524 04/30/24 0528 04/30/24 0734 04/30/24 1119  BP:   123/84 136/78  Pulse:  (!) 121 79 95  Resp:  (!) 29 (!) 30 20  Temp:   97.9 F (36.6 C) 98.4 F (36.9 C)  TempSrc:   Oral Oral  SpO2: 100% 100% 99% 100%  Weight:  90.2 kg    Height:        Intake/Output Summary (Last 24 hours) at 04/30/2024 1217 Last data filed at 04/29/2024 1800 Gross per 24 hour  Intake 921.57 ml  Output 800 ml  Net 121.57 ml      04/30/2024    5:28 AM 04/29/2024    4:20 AM 04/28/2024    4:01 AM  Last 3 Weights  Weight (lbs) 198 lb 13.7 oz 197 lb 1.5 oz 194 lb 0.1 oz  Weight (kg) 90.2 kg 89.4 kg 88 kg      Telemetry Nsr at 85/min - Personally  Reviewed  ECG  No new tracings.   Physical Exam  GEN: No acute distress.   Neck: No JVD Cardiac: RRR, no murmurs, rubs, or gallops.  Respiratory: Clear to auscultation bilaterally. GI: Soft, nontender, non-distended  MS: No edema; No deformity. Neuro:  Nonfocal  Psych: Normal affect   Labs High Sensitivity Troponin:   Recent Labs  Lab 04/25/24 2339 04/26/24 0219  TROPONINIHS 39* 38*     Chemistry Recent Labs  Lab 04/25/24 2339 04/26/24 0734 04/29/24 0334 04/29/24 1354 04/30/24 0340 04/30/24 0756  NA 136   < > 134* 135 134*  --   K 4.3   < > 2.6* 3.5 3.1*  --   CL 110   < > 106 107 106  --   CO2 22   < > 19* 19* 22  --   GLUCOSE 169*   < > 149* 143* 109*  --   BUN 45*   < > 33* 29* 22  --   CREATININE 1.09   < > 0.95 0.89 0.79  --   CALCIUM  8.4*   < > 7.9* 7.9* 7.6*  --  MG 2.3  --  2.0  --   --  1.7  GFRNONAA >60   < > >60 >60 >60  --   ANIONGAP 4*   < > 9 9 6   --    < > = values in this interval not displayed.    Lipids No results for input(s): CHOL, TRIG, HDL, LABVLDL, LDLCALC, CHOLHDL in the last 168 hours.  Hematology Recent Labs  Lab 04/28/24 0016 04/29/24 0334 04/30/24 0340  WBC 8.2 6.0 6.3  RBC 4.13* 3.63* 3.52*  HGB 12.2* 10.9* 10.4*  HCT 37.7* 32.7* 31.9*  MCV 91.3 90.1 90.6  MCH 29.5 30.0 29.5  MCHC 32.4 33.3 32.6  RDW 19.3* 19.0* 19.0*  PLT 373 290 276   Thyroid  No results for input(s): TSH, FREET4 in the last 168 hours.  BNP Recent Labs  Lab 04/25/24 2339 04/30/24 0858  BNP 1,984.7* 1,107.1*    DDimer  Recent Labs  Lab 04/27/24 1344  DDIMER 1.43*     Radiology  US  RENAL Result Date: 04/28/2024 EXAM: US  Retroperitoneum Complete, Renal. 04/28/2024 01:10:00 PM TECHNIQUE: Real-time ultrasonography of the retroperitoneum renal was performed. COMPARISON: US  Renal 12/22/2006 and CT abdomen performed 04/21/2024. CLINICAL HISTORY: Hematuria; Difficulty voiding. FINDINGS: FINDINGS: RIGHT KIDNEY/URETER: Right kidney  measures 10.7 x 6.9 x 5.6 cm. Normal cortical echogenicity. No hydronephrosis. No calculus. No mass. Exophytic cyst off the mid pole measures up to 2.9 cm and appears simple. Posterior right mid pole cyst measures up to 4.5 cm and appears simple. LEFT KIDNEY/URETER: Left kidney measures 13.7 x 8.5 x 4.6 cm. Normal cortical echogenicity. No hydronephrosis. No calculus. No mass. 2 small left renal cysts appear benign, and measure up to 2 cm. BLADDER: Enlarged prostate protruding into the inferior bladder. Layering debris in the bladder could reflect blood clot. IMPRESSION: 1. Layering debris in the bladder possibly representing blood clot. 2. Enlarged prostate protruding into the inferior bladder. Electronically signed by: Franky Crease MD 04/28/2024 05:14 PM EST RP Workstation: [id number]   DG Abd 1 View Result Date: 04/28/2024 CLINICAL DATA:  Abdominal distension.  Ileus. EXAM: ABDOMEN - 1 VIEW COMPARISON:  Radiograph 04/26/2024, CT 04/21/2024 FINDINGS: Diffuse colonic distension, without significant change. Decreasing gaseous small bowel distension centrally. There is excreted IV contrast in the renal collecting systems and ureters. No obvious free air. IMPRESSION: Diffuse colonic distension, without significant change. Decreasing gaseous small bowel distension centrally. Electronically Signed   By: Andrea Gasman M.D.   On: 04/28/2024 13:12    Cardiac Studies  Echocardiogram: 01/2024 IMPRESSIONS     1. Left ventricular ejection fraction, by estimation, is 20 to 25%. The  left ventricle has severely decreased function. The left ventricle  demonstrates global hypokinesis. There is moderate concentric left  ventricular hypertrophy. Indeterminate  diastolic filling due to E-A fusion. There is abnormal (paradoxical)  septal motion, consistent with right ventricular volume overload.   2. Right ventricular systolic function is moderately reduced. The right  ventricular size is mildly enlarged.  Tricuspid regurgitation signal is  inadequate for assessing PA pressure. The estimated right ventricular  systolic pressure is 30.7 mmHg.   3. The mitral valve is degenerative. Moderate mitral valve regurgitation.   4. Tricuspid valve regurgitation is mild to moderate.   5. The aortic valve is tricuspid. Aortic valve regurgitation is trivial.  Aortic valve sclerosis is present, with no evidence of aortic valve  stenosis.   6. The inferior vena cava is dilated in size with <50% respiratory  variability, suggesting right atrial  pressure of 15 mmHg.   Comparison(s): Changes from prior study are noted. 01/13/2024: LVEF 25-30%,  moderate RV systolic dysfunction.    Patient Profile   78 y.o. male w/ PMH of chronic HFrEF (EF 40-45% by echo in 06/2023, at 25-30% in 12/2023 and 20-25% in 01/2024), CAD (s/p remote history of PCI to RI and DES to RCA in 2010, NSTEMI in 12/2023 with DES to ost-prox LAD, repeat cath in 01/2024 showing acute LAD stent occlusion and treated with percutaneous coronary intervention), paroxysmal atrial fibrillation/flutter, HTN, HLD and iron deficiency anemia. Currently admitted for ileus and recurrent Ogilvie syndrome along with acute hypoxic respiratory failure. Cardiology consulted for CHF and atrial fibrillation with RVR.   Assessment & Plan   1. Acute on Chronic HFrEF - he appears a little volume overloaded. Weight is up a kg a day the last 2 days. We will give more diuretic  2. CAD - No anginal symptoms. Goal would be to keep his rate controlled.  3. Persistent Atrial Fibrillation - back in NSR on tele with a clear p wave in front of a QRS.  4. Progressive Ileus/Recurrent Ogilvie Syndrome - GI following.   For questions or updates, please contact [facility] Please consult [email or web address] for contact info under    Danelle Waddell COME

## 2024-05-01 ENCOUNTER — Ambulatory Visit (HOSPITAL_COMMUNITY)

## 2024-05-01 LAB — CBC WITH DIFFERENTIAL/PLATELET
Abs Immature Granulocytes: 0.03 K/uL (ref 0.00–0.07)
Basophils Absolute: 0 K/uL (ref 0.0–0.1)
Basophils Relative: 0 %
Eosinophils Absolute: 0.1 K/uL (ref 0.0–0.5)
Eosinophils Relative: 2 %
HCT: 32.6 % — ABNORMAL LOW (ref 39.0–52.0)
Hemoglobin: 10.8 g/dL — ABNORMAL LOW (ref 13.0–17.0)
Immature Granulocytes: 1 %
Lymphocytes Relative: 17 %
Lymphs Abs: 0.9 K/uL (ref 0.7–4.0)
MCH: 29.9 pg (ref 26.0–34.0)
MCHC: 33.1 g/dL (ref 30.0–36.0)
MCV: 90.3 fL (ref 80.0–100.0)
Monocytes Absolute: 0.4 K/uL (ref 0.1–1.0)
Monocytes Relative: 8 %
Neutro Abs: 3.9 K/uL (ref 1.7–7.7)
Neutrophils Relative %: 72 %
Platelets: 278 K/uL (ref 150–400)
RBC: 3.61 MIL/uL — ABNORMAL LOW (ref 4.22–5.81)
RDW: 18.5 % — ABNORMAL HIGH (ref 11.5–15.5)
WBC: 5.4 K/uL (ref 4.0–10.5)
nRBC: 0 % (ref 0.0–0.2)

## 2024-05-01 LAB — GLUCOSE, CAPILLARY
Glucose-Capillary: 101 mg/dL — ABNORMAL HIGH (ref 70–99)
Glucose-Capillary: 110 mg/dL — ABNORMAL HIGH (ref 70–99)
Glucose-Capillary: 133 mg/dL — ABNORMAL HIGH (ref 70–99)
Glucose-Capillary: 140 mg/dL — ABNORMAL HIGH (ref 70–99)
Glucose-Capillary: 157 mg/dL — ABNORMAL HIGH (ref 70–99)
Glucose-Capillary: 161 mg/dL — ABNORMAL HIGH (ref 70–99)
Glucose-Capillary: 97 mg/dL (ref 70–99)

## 2024-05-01 LAB — BASIC METABOLIC PANEL WITH GFR
Anion gap: 11 (ref 5–15)
Anion gap: 3 — ABNORMAL LOW (ref 5–15)
BUN: 18 mg/dL (ref 8–23)
BUN: 16 mg/dL (ref 8–23)
CO2: 26 mmol/L (ref 22–32)
CO2: 31 mmol/L (ref 22–32)
Calcium: 7.7 mg/dL — ABNORMAL LOW (ref 8.9–10.3)
Calcium: 7.8 mg/dL — ABNORMAL LOW (ref 8.9–10.3)
Chloride: 101 mmol/L (ref 98–111)
Chloride: 103 mmol/L (ref 98–111)
Creatinine, Ser: 0.83 mg/dL (ref 0.61–1.24)
Creatinine, Ser: 0.99 mg/dL (ref 0.61–1.24)
GFR, Estimated: >60 mL/min (ref >60)
GFR, Estimated: >60 mL/min (ref >60)
Glucose, Bld: 95 mg/dL (ref 70–99)
Glucose, Bld: 115 mg/dL — ABNORMAL HIGH (ref 70–99)
Potassium: 2.2 mmol/L — CL (ref 3.5–5.1)
Potassium: 3.2 mmol/L — ABNORMAL LOW (ref 3.5–5.1)
Sodium: 138 mmol/L (ref 135–145)
Sodium: 137 mmol/L (ref 135–145)

## 2024-05-01 LAB — MAGNESIUM: Magnesium: 1.5 mg/dL — ABNORMAL LOW (ref 1.7–2.4)

## 2024-05-01 LAB — APTT: aPTT: 46 s — ABNORMAL HIGH (ref 24–36)

## 2024-05-01 LAB — HEPARIN LEVEL (UNFRACTIONATED): Heparin Unfractionated: 0.26 [IU]/mL — ABNORMAL LOW (ref 0.30–0.70)

## 2024-05-01 MED ORDER — FUROSEMIDE 10 MG/ML IJ SOLN
40.0000 mg | Freq: Once | INTRAMUSCULAR | Status: AC
  Administered 2024-05-01: 40 mg via INTRAVENOUS
  Filled 2024-05-01: qty 4

## 2024-05-01 MED ORDER — POTASSIUM CHLORIDE 10 MEQ/100ML IV SOLN
10.0000 meq | INTRAVENOUS | Status: AC
  Administered 2024-05-01 (×4): 10 meq via INTRAVENOUS
  Filled 2024-05-01 (×4): qty 100

## 2024-05-01 MED ORDER — AMIODARONE HCL 200 MG PO TABS
200.0000 mg | ORAL_TABLET | Freq: Every day | ORAL | Status: DC
  Administered 2024-05-01 – 2024-05-11 (×11): 200 mg via ORAL
  Filled 2024-05-01 (×12): qty 1

## 2024-05-01 MED ORDER — IPRATROPIUM BROMIDE 0.02 % IN SOLN
0.5000 mg | Freq: Two times a day (BID) | RESPIRATORY_TRACT | Status: DC
  Administered 2024-05-01 – 2024-05-11 (×19): 0.5 mg via RESPIRATORY_TRACT
  Filled 2024-05-01 (×20): qty 2.5

## 2024-05-01 MED ORDER — POTASSIUM CHLORIDE 20 MEQ PO PACK
60.0000 meq | PACK | Freq: Once | ORAL | Status: AC
  Administered 2024-05-01: 60 meq via ORAL
  Filled 2024-05-01: qty 3

## 2024-05-01 MED ORDER — POTASSIUM CHLORIDE 20 MEQ PO PACK
20.0000 meq | PACK | Freq: Once | ORAL | Status: AC
  Administered 2024-05-01: 20 meq via ORAL
  Filled 2024-05-01: qty 1

## 2024-05-01 MED ORDER — MAGNESIUM SULFATE 4 GM/100ML IV SOLN
4.0000 g | Freq: Once | INTRAVENOUS | Status: AC
  Administered 2024-05-01: 4 g via INTRAVENOUS
  Filled 2024-05-01: qty 100

## 2024-05-01 MED ORDER — SODIUM CHLORIDE 0.9 % IV SOLN
1.0000 g | INTRAVENOUS | Status: AC
  Administered 2024-05-01 – 2024-05-02 (×2): 1 g via INTRAVENOUS
  Filled 2024-05-01 (×2): qty 10

## 2024-05-01 MED ORDER — ACETYLCYSTEINE 20 % IN SOLN
2.0000 mL | Freq: Two times a day (BID) | RESPIRATORY_TRACT | Status: DC
  Administered 2024-05-01 – 2024-05-10 (×18): 2 mL via RESPIRATORY_TRACT
  Filled 2024-05-01 (×20): qty 4

## 2024-05-01 MED ORDER — POTASSIUM CHLORIDE 20 MEQ PO PACK
40.0000 meq | PACK | Freq: Once | ORAL | Status: AC
  Administered 2024-05-01: 40 meq via ORAL
  Filled 2024-05-01: qty 2

## 2024-05-01 NOTE — Progress Notes (Addendum)
 PROGRESS NOTE  Chase Combs FMW:[id number] DOB: 13-Mar-1946 DOA: 04/21/2024 PCP: Wonda Worth SQUIBB, PA   HPI/Recap of past 24 hours: 78 y.o. 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 Of note, patient had recent complicated admission for septic shock, cardiogenic shock, intestinal pneumatosis/ileus/pseudoobstruction/Ogilvie syndrome. Now admitted for recurrent Ogilvie syndrome, suspected gastroenterocolitis, AKI on CKD, hypotension and acute debilitation.       Today, patient slowly improving, now back in normal sinus rhythm with better rate control.  Able to tolerate orally.  SOB subjectively improving.  Patient still appears overall deconditioned.    Assessment/Plan: Principal Problem:   Enterocolitis Active Problems:   Ogilvie syndrome   Chronic systolic CHF (congestive heart failure) (HCC)   Hypokalemia   HLD (hyperlipidemia)   Acute kidney injury   DM2 (diabetes mellitus, type 2) (HCC)   Postural dizziness with presyncope   Ileus, unspecified (HCC)   Elevated troponin   Fall at home, initial encounter   Hypotension   Paroxysmal atrial flutter (HCC)   Anemia of chronic disease   Adynamic ileus (HCC)   Chronic combined systolic and diastolic congestive heart failure (HCC)    Diffuse progressive ileus Recurrent Ogilvie Syndrome/pseudoobstruction History of recent hospitalization (01/22/24-02/25/24) for similar GI symptoms with extensive workup. Underwent colonic  decompression (02/18/24) with improvement Currently afebrile, with no leukocytosis C diff and enteric PCR panel negative CT A/P w/o contrast with noted diffuse mild dilatation of colon, distended ileal loops and stomach Repeat abdominal x-ray on 12/3 showed diffuse ileus involving colon and small bowel GI consulted, appreciate recs, continue MiraLAX , rectal tube, high risk for complications with sedation for colonic decompression Continue soft diet Aspiration precautions Monitor closely  Acute hypoxic respiratory failure Currently requiring about 2 L of O2, improving tachypenia ABG showed pH 7.37, pO2 75, CO2 40 D-dimer mildly elevated Procalcitonin 0.26 Likely in the setting of abdominal distension from underlying ileus/pseudoobstruction/gastroenterocolitis in addition to advanced CHF (required IV fluid resuscitation due to AKI) CTA chest showing no PE, but new and increased bilateral ground glass opacity, airway thickening which may reflect atypical pneumonia, acute eosinophilic pneumonia, hypersensitivity pneumonitis, pulmonary edema or pulmonary hemorrhage, noted mucus in the right mainstem bronchus PCCM consulted Continue IV antibiotics, DuoNebs, nebulizers, Mucomyst  Supplemental O2 as needed  CHFrEF Ischemic cardiomyopathy Recent prolonged hospitalization complicated by cardiogenic shock with acute CHF exacerbation in the setting of LAD stent stenosis  BNP 1984 Last echo 01/2024 with LVEF 20-25% CXR 12/2 showed bilateral opacities Cardiology consulted, appreciate recs Continue lasix , spironolactone , continue to hold entresto , digoxin , Jardiance  Received 1 dose of IV lasix  80 mg once on 12/7 by cardiology Monitor I/Os, daily weights, renal function  Persistent Afib/flutter with RVR Heart rate improved, now back in sinus rhythm as of 12/7 S/p TEE CV on 02/21/2024 Cardiology on board, appreciate recs Continue IV amiodarone , IV heparin  Eliquis  held Telemetry  Gross  hematuria Resolved Noted hematuria on 12/4 Renal ultrasound showed enlarged prostate protruding into the inferior bladder, layering debris's in the bladder could reflect blood clot Urology consulted, placed foley on 12/5 Continue flomax  Monitor closely while on  IV heparin   CAD s/p DES to LAD 8/25 Hyperlipidemia Recent balloon angioplasty of LAD stent thrombosis (02/01/2024) due to inability to tolerate oral DAPT  History of remote PCI to RI and RCA EKG with known RBBB, LAFB, no significant changes from prior Troponin 67 < 71 Cardiology consulted, appreciate recs Cont home plavix , held Eliquis , continue Crestor  Telemetry  Hypotension BP soft CT head unremarkable S/p IV fluids  Hypokalemia/hypomagnesemia Likely in the setting of ileus, frequent diarrhea Monitor and replete electrolytes as needed   AKI on CKD stage II Resolved Cr 1.11 < 2.1, baseline 0.9-1.5 Likely in the setting of dehydration from diarrhea CT A/P with no concern for obstructive disease Avoid nephrotoxic agents Renal dose medications Monitor BMP   Peptic ulcer disease Recent hospitalization with EGD (02/02/24) showing esophageal ulcer without bleeding, grade C reflux esophagitis without bleeding, duodenal erosions Cont pantoprazole    T2DM Hold home Jardiance  Cont SSI   Incidental R thyroid  nodule Noted on CT C spine TSH normal Will need outpatient thyroid  US    Chronic normocytic anemia Hgb 11.2, baseline 10-12 Daily CBC   Acute debilitation Mechanical fall Monitor on fall precautions PT/OT following- rec SNF  GOC discussion Patient with poor prognosis, recurrent severe ileus, advanced CHF Palliative care consulted, appreciate recs Currently DNR   Malnutrition Type:  Nutrition Problem: Moderate Malnutrition Etiology: chronic illness   Malnutrition Characteristics:  Signs/Symptoms: moderate fat depletion, severe muscle depletion, moderate muscle depletion   Nutrition  Interventions:  Interventions: Refer to RD note for recommendations    Estimated body mass index is 26.97 kg/m as calculated from the following:   Height as of this encounter: 6' (1.829 m).   Weight as of this encounter: 90.2 kg.     Code Status: DNR  Family Communication: Discussed with wife over the phone on 12/5  Disposition Plan: Status is: Inpatient Remains inpatient appropriate because: Level of care      Consultants: GI Cardiology PCCM Urology  Procedures: None  Antimicrobials: Cefepime  Azithromycin   DVT prophylaxis: IV heparin    Objective: Vitals:   04/30/24 2300 05/01/24 0221 05/01/24 0500 05/01/24 0749  BP: (!) 107/54 (!) 110/58  94/77  Pulse: 83 82  79  Resp: 20 20  (!) 22  Temp: 98.1 F (36.7 C) 98.1 F (36.7 C)  97.7 F (36.5 C)  TempSrc: Oral Axillary  Oral  SpO2: 100% 99%  99%  Weight:   90.2 kg   Height:        Intake/Output Summary (Last 24 hours) at 05/01/2024 1025 Last data filed at 05/01/2024 0300 Gross per 24 hour  Intake 2229.65 ml  Output 3650 ml  Net -1420.35 ml   Filed Weights   04/29/24 0420 04/30/24 0528 05/01/24 0500  Weight: 89.4 kg 90.2 kg 90.2 kg    Exam: General: NAD, awake/alert, deconditioned Cardiovascular: S1, S2 present Respiratory: Diminished BS bilaterally Abdomen: Soft, nontender, +distended, bowel sounds present Musculoskeletal: No bilateral pedal edema noted Skin: Normal Psychiatry: fair mood     Data Reviewed: CBC: Recent Labs  Lab 04/27/24 0353 04/28/24 0016 04/29/24 0334 04/30/24 0340 05/01/24 0355  WBC 7.9 8.2 6.0 6.3 5.4  NEUTROABS 5.7 6.2 5.6 5.0 3.9  HGB 11.9* 12.2* 10.9* 10.4* 10.8*  HCT 37.1* 37.7* 32.7* 31.9* 32.6*  MCV 92.8 91.3 90.1 90.6 90.3  PLT 365 373 290 276 278   Basic Metabolic Panel: Recent Labs  Lab 04/25/24 0535 04/25/24 0745 04/25/24 2339 04/26/24 0734 04/28/24 0016 04/29/24 0334 04/29/24 1354 04/30/24 0340 04/30/24 0756 05/01/24 0354  05/01/24 0355   NA  --    < > 136   < > 138 134* 135 134*  --   --  138  K  --    < > 4.3   < > 3.7 2.6* 3.5 3.1*  --   --  2.2*  CL  --    < > 110   < > 105 106 107 106  --   --  101  CO2  --    < > 22   < > 23 19* 19* 22  --   --  26  GLUCOSE  --    < > 169*   < > 110* 149* 143* 109*  --   --  95  BUN  --    < > 45*   < > 34* 33* 29* 22  --   --  18  CREATININE  --    < > 1.09   < > 0.96 0.95 0.89 0.79  --   --  0.83  CALCIUM   --    < > 8.4*   < > 8.2* 7.9* 7.9* 7.6*  --   --  7.7*  MG 2.2  --  2.3  --   --  2.0  --   --  1.7 1.5*  --    < > = values in this interval not displayed.   GFR: Estimated Creatinine Clearance: 83.1 mL/min (by C-G formula based on SCr of 0.83 mg/dL). Liver Function Tests: No results for input(s): AST, ALT, ALKPHOS, BILITOT, PROT, ALBUMIN in the last 168 hours.  No results for input(s): LIPASE, AMYLASE in the last 168 hours. No results for input(s): AMMONIA in the last 168 hours. Coagulation Profile: No results for input(s): INR, PROTIME in the last 168 hours.  Cardiac Enzymes: No results for input(s): CKTOTAL, CKMB, CKMBINDEX, TROPONINI in the last 168 hours. BNP (last 3 results) Recent Labs    01/07/24 2301  PROBNP 15,245.0*   HbA1C: No results for input(s): HGBA1C in the last 72 hours. CBG: Recent Labs  Lab 04/30/24 2039 05/01/24 0018 05/01/24 0422 05/01/24 0753 05/01/24 0953  GLUCAP 86 97 101* 161* 110*   Lipid Profile: No results for input(s): CHOL, HDL, LDLCALC, TRIG, CHOLHDL, LDLDIRECT in the last 72 hours. Thyroid  Function Tests: No results for input(s): TSH, T4TOTAL, FREET4, T3FREE, THYROIDAB in the last 72 hours. Anemia Panel: No results for input(s): VITAMINB12, FOLATE, FERRITIN, TIBC, IRON, RETICCTPCT in the last 72 hours. Urine analysis:    Component Value Date/Time   COLORURINE YELLOW 04/22/2024 1205   APPEARANCEUR CLEAR 04/22/2024 1205   LABSPEC >1.030 (H) 04/22/2024 1205    PHURINE 5.5 04/22/2024 1205   GLUCOSEU NEGATIVE 04/22/2024 1205   HGBUR NEGATIVE 04/22/2024 1205   BILIRUBINUR NEGATIVE 04/22/2024 1205   KETONESUR NEGATIVE 04/22/2024 1205   PROTEINUR NEGATIVE 04/22/2024 1205   UROBILINOGEN 0.2 02/17/2009 0645   NITRITE NEGATIVE 04/22/2024 1205   LEUKOCYTESUR NEGATIVE 04/22/2024 1205   Sepsis Labs: @LABRCNTIP (procalcitonin:4,lacticidven:4)  ) Recent Results (from the past 240 hours)  Resp panel by RT-PCR (RSV, Flu A&B, Covid) Anterior Nasal Swab     Status: None   Collection Time: 04/21/24  7:51 PM   Specimen: Anterior Nasal Swab  Result Value Ref Range Status   SARS Coronavirus 2 by RT PCR NEGATIVE NEGATIVE Final    Comment: (NOTE) SARS-CoV-2 target nucleic acids are NOT DETECTED.  The SARS-CoV-2 RNA is generally detectable in upper respiratory specimens during the acute phase of  infection. The lowest concentration of SARS-CoV-2 viral copies this assay can detect is 138 copies/mL. A negative result does not preclude SARS-Cov-2 infection and should not be used as the sole basis for treatment or other patient management decisions. A negative result may occur with  improper specimen collection/handling, submission of specimen other than nasopharyngeal swab, presence of viral mutation(s) within the areas targeted by this assay, and inadequate number of viral copies(<138 copies/mL). A negative result must be combined with clinical observations, patient history, and epidemiological information. The expected result is Negative.  Fact Sheet for Patients:  bloggercourse.com  Fact Sheet for Healthcare Providers:  seriousbroker.it  This test is no t yet approved or cleared by the [place]  FDA and  has been authorized for detection and/or diagnosis of SARS-CoV-2 by FDA under an Emergency Use Authorization (EUA). This EUA will remain  in effect (meaning this test can be used) for the duration  of the COVID-19 declaration under Section 564(b)(1) of the Act, 21 U.S.C.section 360bbb-3(b)(1), unless the authorization is terminated  or revoked sooner.       Influenza A by PCR NEGATIVE NEGATIVE Final   Influenza B by PCR NEGATIVE NEGATIVE Final    Comment: (NOTE) The Xpert Xpress SARS-CoV-2/FLU/RSV plus assay is intended as an aid in the diagnosis of influenza from Nasopharyngeal swab specimens and should not be used as a sole basis for treatment. Nasal washings and aspirates are unacceptable for Xpert Xpress SARS-CoV-2/FLU/RSV testing.  Fact Sheet for Patients: bloggercourse.com  Fact Sheet for Healthcare Providers: seriousbroker.it  This test is not yet approved or cleared by the [place]  FDA and has been authorized for detection and/or diagnosis of SARS-CoV-2 by FDA under an Emergency Use Authorization (EUA). This EUA will remain in effect (meaning this test can be used) for the duration of the COVID-19 declaration under Section 564(b)(1) of the Act, 21 U.S.C. section 360bbb-3(b)(1), unless the authorization is terminated or revoked.     Resp Syncytial Virus by PCR NEGATIVE NEGATIVE Final    Comment: (NOTE) Fact Sheet for Patients: bloggercourse.com  Fact Sheet for Healthcare Providers: seriousbroker.it  This test is not yet approved or cleared by the [place]  FDA and has been authorized for detection and/or diagnosis of SARS-CoV-2 by FDA under an Emergency Use Authorization (EUA). This EUA will remain in effect (meaning this test can be used) for the duration of the COVID-19 declaration under Section 564(b)(1) of the Act, 21 U.S.C. section 360bbb-3(b)(1), unless the authorization is terminated or revoked.  Performed at [facility], [address] [place], [place] [address]   C Difficile Quick Screen w PCR reflex     Status: None    Collection Time: 04/22/24  6:33 PM   Specimen: STOOL  Result Value Ref Range Status   C Diff antigen NEGATIVE NEGATIVE Final   C Diff toxin NEGATIVE NEGATIVE Final   C Diff interpretation No C. difficile detected.  Final    Comment: Performed at [facility], [address], [place], [place] [address]  Gastrointestinal Panel by PCR , Stool     Status: None   Collection Time: 04/22/24  6:33 PM   Specimen: Stool  Result Value Ref Range Status   Campylobacter species NOT DETECTED NOT DETECTED Final   Plesimonas shigelloides NOT DETECTED NOT DETECTED Final   Salmonella species NOT DETECTED NOT DETECTED Final   Yersinia enterocolitica NOT DETECTED NOT DETECTED Final   Vibrio species NOT DETECTED NOT DETECTED Final   Vibrio cholerae NOT  DETECTED NOT DETECTED Final   Enteroaggregative E coli (EAEC) NOT DETECTED NOT DETECTED Final   Enteropathogenic E coli (EPEC) NOT DETECTED NOT DETECTED Final   Enterotoxigenic E coli (ETEC) NOT DETECTED NOT DETECTED Final   Shiga like toxin producing E coli (STEC) NOT DETECTED NOT DETECTED Final   Shigella/Enteroinvasive E coli (EIEC) NOT DETECTED NOT DETECTED Final   Cryptosporidium NOT DETECTED NOT DETECTED Final   Cyclospora cayetanensis NOT DETECTED NOT DETECTED Final   Entamoeba histolytica NOT DETECTED NOT DETECTED Final   Giardia lamblia NOT DETECTED NOT DETECTED Final   Adenovirus F40/41 NOT DETECTED NOT DETECTED Final   Astrovirus NOT DETECTED NOT DETECTED Final   Norovirus GI/GII NOT DETECTED NOT DETECTED Final   Rotavirus A NOT DETECTED NOT DETECTED Final   Sapovirus (I, II, IV, and V) NOT DETECTED NOT DETECTED Final    Comment: Performed at [facility], [address] [place], [place] [address]  Respiratory (~20 pathogens) panel by PCR     Status: None   Collection Time: 04/28/24  6:06 PM   Specimen: Nasopharyngeal Swab; Respiratory  Result Value Ref Range Status   Adenovirus NOT DETECTED NOT DETECTED Final    Coronavirus 229E NOT DETECTED NOT DETECTED Final    Comment: (NOTE) The Coronavirus on the Respiratory Panel, DOES NOT test for the novel  Coronavirus (2019 nCoV)    Coronavirus HKU1 NOT DETECTED NOT DETECTED Final   Coronavirus NL63 NOT DETECTED NOT DETECTED Final   Coronavirus OC43 NOT DETECTED NOT DETECTED Final   Metapneumovirus NOT DETECTED NOT DETECTED Final   Rhinovirus / Enterovirus NOT DETECTED NOT DETECTED Final   Influenza A NOT DETECTED NOT DETECTED Final   Influenza B NOT DETECTED NOT DETECTED Final   Parainfluenza Virus 1 NOT DETECTED NOT DETECTED Final   Parainfluenza Virus 2 NOT DETECTED NOT DETECTED Final   Parainfluenza Virus 3 NOT DETECTED NOT DETECTED Final   Parainfluenza Virus 4 NOT DETECTED NOT DETECTED Final   Respiratory Syncytial Virus NOT DETECTED NOT DETECTED Final   Bordetella pertussis NOT DETECTED NOT DETECTED Final   Bordetella Parapertussis NOT DETECTED NOT DETECTED Final   Chlamydophila pneumoniae NOT DETECTED NOT DETECTED Final   Mycoplasma pneumoniae NOT DETECTED NOT DETECTED Final    Comment: Performed at [facility], [address] [place], [place] [address]  MRSA Next Gen by PCR, Nasal     Status: None   Collection Time: 04/29/24  4:31 PM   Specimen: Nasal Mucosa; Nasal Swab  Result Value Ref Range Status   MRSA by PCR Next Gen NOT DETECTED NOT DETECTED Final    Comment: (NOTE) The GeneXpert MRSA Assay (FDA approved for NASAL specimens only), is one component of a comprehensive MRSA colonization surveillance program. It is not intended to diagnose MRSA infection nor to guide or monitor treatment for MRSA infections. Test performance is not FDA approved in patients less than 45 years old. Performed at [facility], [address] [place], [place] [address]       Studies: DG CHEST PORT 1 VIEW Result Date: 04/30/2024 EXAM: 1 VIEW(S) XRAY OF THE CHEST 04/30/2024 06:19:00 PM COMPARISON: 04/27/2024 CLINICAL HISTORY: Dyspnea  FINDINGS: LUNGS AND PLEURA: Stable elevation of the right hemidiaphragm. Patchy bilateral perihilar pulmonary infiltrates are again identified, likely infectious or inflammatory in the acute setting. No pneumothorax or pleural effusion. HEART AND MEDIASTINUM: Cardiac size within normal limits. BONES AND SOFT TISSUES: Advanced degenerative changes seen within the right shoulder. No acute bone abnormality.  IMPRESSION: 1. Patchy bilateral perihilar pulmonary infiltrates, likely infectious or inflammatory in the acute setting. 2. Stable elevation of the right hemidiaphragm. 3. Advanced degenerative changes of the right shoulder. Electronically signed by: Dorethia Molt MD 04/30/2024 08:23 PM EST RP Workstation: [id number]      Scheduled Meds:  acetylcysteine   2 mL Nebulization TID   azithromycin   500 mg Oral Daily   budesonide  (PULMICORT ) nebulizer solution  0.25 mg Nebulization BID   Chlorhexidine  Gluconate Cloth  6 each Topical Daily   clopidogrel   75 mg Oral Daily   feeding supplement  1 Container Oral TID BM   fiber supplement (BANATROL TF)  60 mL Oral BID   furosemide   40 mg Oral Daily   insulin  aspart  0-6 Units Subcutaneous Q4H   ipratropium  0.5 mg Nebulization Q8H   lidocaine   1 Application Urethral Once   pantoprazole  (PROTONIX ) IV  40 mg Intravenous Q24H   polyethylene glycol  17 g Oral TID WC   revefenacin   175 mcg Nebulization Daily   rosuvastatin   20 mg Oral Daily   spironolactone   25 mg Oral Daily   tamsulosin   0.4 mg Oral QPC supper    Continuous Infusions:  amiodarone  30 mg/hr (05/01/24 9391)   ceFEPime  (MAXIPIME ) IV 2 g (05/01/24 1015)   heparin  1,850 Units/hr (05/01/24 1025)   magnesium  sulfate bolus IVPB     potassium chloride  10 mEq (05/01/24 0959)     LOS: 9 days     Lebron JINNY Cage, MD [facility]  If 7PM-7AM, please contact night-coverage [email or web address] 05/01/2024, 10:25 AM

## 2024-05-01 NOTE — Progress Notes (Signed)
 K 3.2 after earlier replacement. Ok to replace with 60meq more x1 per Dr. Ezenduka.  Sergio Batch, PharmD, BCIDP, AAHIVP, CPP Infectious Disease Pharmacist 05/01/2024 5:05 PM

## 2024-05-01 NOTE — Progress Notes (Addendum)
 NAME:  Chase Combs, MRN:  [id number], DOB:  [birth year], LOS: 9 ADMISSION DATE:  04/21/2024, CONSULTATION DATE:  12/5 REFERRING MD:  Donnamarie, CHIEF COMPLAINT:  resp failure    History of Present Illness:  78 year old male patient with extensive medical history including ischemic cardiomyopathy with EF 20 to 25%, chronic atrial fibrillation, coronary artery disease, atrial fibrillation, and recurrent acute colonic pseudo obstruction Presented to the emergency room on 11/29 with episode of presyncope after 1 to 2 days of nausea vomiting and diarrhea.  In ER initially found to be hypotensive, responding initially to IV fluid hydration, had acute on chronic renal failure, and CT of abdomen and pelvis showed diffuse mild dilation of the colon without a transition point suggesting colitis versus ileus.  Was admitted to the medical team, gastroenterology services were consulted for progressive ileus/Ogilvie syndrome, who also felt likely the recurrent pseudoobstruction may also be exacerbated by chronic low flow state from his congestive heart failure.  Cardiology was consulted for ongoing atrial fibrillation initially.  Therapeutic interventions in regards to the Ogilvie syndrome has included initially IV hydration, antiemetics, and tapwater enemas.  From a cardiology standpoint his paroxysmal atrial fibrillation was treated with IV amiodarone .  On 12/3 noted to have increased abdominal tightness Worsening abdominal discomfort.  Mild increased need and FiO2.  On 12/4 began to report increased shortness of breath, and simply said just felt worn out, noted to be a bit more lethargic compared to prior exams.  IV fluids were discontinued on 12/5 continued to have significant lethargy, ongoing oxygen need, and worsening work of breathing and therefore a CT angiogram was obtained this demonstrated diffuse bilateral patchy airspace disease thickened esophagus and mucus in the right mainstem bronchus with increased  colonic distention and air-fluid level.  Because of ongoing hypoxia and CT findings pulmonary was asked to evaluate  Pertinent  Medical History  Ischemic cardiomyopathy with a EF 20 to 25%, coronary artery disease with prior stenting to the LAD in August 2025, atrial fibrillation, hyperlipidemia, hypertension type 2 diabetes, prostate cancer status post radiation 2023, recurrent acute colonic pseudoobstruction  Significant Hospital Events: Including procedures, antibiotic start and stop dates in addition to other pertinent events   11/29 Admitted  12/5 PCCM consulted for hypoxia 12/7 no acute issues overnight, remains on 4L [place]  12/8 continuing to wean O2   Interim History / Subjective:  No acute events overnight. Denies chest pain or dyspnea.   Objective    Blood pressure 94/77, pulse 79, temperature 97.7 F (36.5 C), temperature source Oral, resp. rate (!) 22, height 6' (1.829 m), weight 90.2 kg, SpO2 99%.    FiO2 (%):  [36 %] 36 %   Intake/Output Summary (Last 24 hours) at 05/01/2024 1017 Last data filed at 05/01/2024 0300 Gross per 24 hour  Intake 2229.65 ml  Output 3650 ml  Net -1420.35 ml   Filed Weights   04/29/24 0420 04/30/24 0528 05/01/24 0500  Weight: 89.4 kg 90.2 kg 90.2 kg    Examination: General: acute on chronically ill appearing male, resting in bed comfortably, no acute distress HEENT: [place]/AT, MMM, sclera anicteric Neuro: alert and responsive, answers questions appropriately, follows commands throughout  CV: regular rate and rhythm, normal S1 and S2, no m/r/g  PULM: breathing comfortably on nasal cannula, expiratory wheeze, diminished bilateral bases GI: soft, non-tender, non-distended Extremities: warm, dry, mild dependent edema   Skin: no rashes or lesions  Labs/imaging in chart reviewed   Resolved problem list   Assessment and  Plan   Acute hypoxic respiratory failure, improving  Acute colonic pseudo-obstruction CT chest with new GGO b/l and mucous  plug in right mainstem. Suspect acute respiratory failure likely multifactorial due to compressive atelectasis in the setting of abdominal distension and deconditioning, also possible aspiration pneumonia/pneumonitis.  - Complete azithromycin , narrow to ceftriaxone  to complete 5 day course - Continue scheduled mucomyst  - Continue scheduled bronchodilators - Continue aggressive pulmonary hygiene and mobilization - Continue diuresis PRN  PCCM to sign off, please re-engage as needed.   Signature:   Rexene LOISE Blush, PA-C [facility] 05/01/24 10:54 AM  Please see [email or web address] for pager details.  From 7A-7P if no response, please call [phone] After hours, please call ELink [phone]

## 2024-05-01 NOTE — Progress Notes (Signed)
 Nutrition Follow-up  DOCUMENTATION CODES:   Non-severe (moderate) malnutrition in context of chronic illness  INTERVENTION:   Continue Boost Breeze po TID, each supplement provides 250 kcal and 9 grams of protein. Continue Banatrol BID-provides 45kcal, 5g soluble fiber and 2g protein per serving.  NUTRITION DIAGNOSIS:   Moderate Malnutrition related to chronic illness as evidenced by moderate fat depletion, severe muscle depletion, moderate muscle depletion; ongoing   GOAL:   Patient will meet greater than or equal to 90% of their needs; progressing   MONITOR:   Diet advancement, Labs  REASON FOR ASSESSMENT:   Consult Assessment of nutrition requirement/status  ASSESSMENT:   Pt with hx of heart failure, atrial fibrillation, type 2 diabetes, and OSA. Recent admission 8/30-10/2 for colonic pseudoobstruction and underwent EGD, decompression colonoscopy, and required TPN. Admitted with suspected gastroenterocolitis after 1-2 days of n/v, and diarrhea, diagnosed AKI on CKD, and debilitation.  Patient states he is feeling good today and reports good intake of meals. He continues to have diarrhea. He has been drinking Boost Breeze supplement. He was originally on [employer], but was changed to [employer] 12/4. He states that he likes both supplements and wants to continue the [facility] for now.  Patient is now on a soft diet.  Meal intakes: 70-100% (12/7)   Patient has had 8 bowel movements in the past 24 hours.   Admit weight 85 kg (11/28) Current weight 90.2 kg (12/8) Patient with generalized edema.  Labs reviewed.  K 2.2 Mag 1.5 CBG: 101-161-110-157  Medications reviewed and include banatrol, lasix , novolog , protonix , miralax , klor-con  x 2, IV mag sulfate x 1, IV potassium chloride  x 4 runs.  Diet Order:   Diet Order             DIET SOFT Room service appropriate? Yes; Fluid consistency: Thin  Diet effective now                   EDUCATION NEEDS:    Education needs have been addressed  Skin:  Skin Assessment: Reviewed RN Assessment  Last BM:  12/7 type 7 x 8  Height:   Ht Readings from Last 1 Encounters:  04/21/24 6' (1.829 m)    Weight:   Wt Readings from Last 1 Encounters:  05/01/24 90.2 kg    Ideal Body Weight:  80.9 kg  BMI:  Body mass index is 26.97 kg/m.  Estimated Nutritional Needs:   Kcal:  1900-2100  Protein:  90-110 g  Fluid:  2L   Suzen HUNT RD, LDN, CNSC Contact via secure chat. If unavailable, use group chat RD Inpatient.

## 2024-05-01 NOTE — Progress Notes (Signed)
 Daily Progress Note   Date: 05/01/2024   Patient Name: Chase Combs  DOB: July 20, 1945  MRN: [id number]  Age / Sex: 78 y.o., male  Attending Physician: Donnamarie Lebron PARAS, MD Primary Care Physician: Wonda Worth SQUIBB, PA Admit Date: 04/21/2024 Length of Stay: 9 days  Reason for Follow-up: Establishing goals of care  Past Medical History:  Diagnosis Date   Abdominal hernia    per pt   Arthritis    hands   Benign localized prostatic hyperplasia with lower urinary tract symptoms (LUTS)    CAD (coronary artery disease) 05/2004   cardiologist--- dr anner;  positive stress test , had outpt cardiac cath 05-27-2004 stenosis RI;  06-18-2004 cath w/ PCI and BMS x1 to pRI;   STEMI 02-16-2009  s/p cath w/ PCI thrombectomy for total occluded RCA , BMS x1 to pRCA;  nuclear stress test 03-15-2012 low risk no ischemia but sig inferior-inferolateral infarct , ef 51%   Dyslipidemia    Essential hypertension    Heart murmur    History of COVID-19 05/2020   per pt mild symptoms that resolved   History of ST elevation myocardial infarction (STEMI) 02/16/2009   inferior STEMI   cath w/ pci w/ BMS to pRCA   History of urinary retention    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   urologist--- dr borden/ radiation oncology-- dr patrcia;  dx 12/ 2022,  Gleason 4+5, PSA 24.6, volume 94.6cc;  plan to start IMRT   OSA (obstructive sleep apnea)    per pt dx approx 2008, no cpap intolerant   PONV (postoperative nausea and vomiting)    Pre-diabetes    S/p bare metal coronary artery stent    01/ 2006  x1 BMS to pRI (CoStar study stent);  and 09/ 2010  x1 BMS to pRCA   Status post total knee replacement, 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    Per H&P on 04/22/2024 by [facility] MD, patient presented after a couple days of N/V/D with decreased PO intake. Patient reported dizziness and lightheadedness resulting in a fall with head strike but no loss of consciousness.    Per cardiology progress note 04/28/2024 by Loni MD patient volume status elevated per redsclip (40%) with plans to reinitiate lasix .    Per GI progress note on 04/28/2024 by Nandigam MD interventions for acute colonic pseudo-obstruction limited by tenuous cardiac status without any current plans for repeat colonoscopy for decompression.    CT head on 04/21/24 negative for acute intracranial abnormality. CT abdomen on 11/28 demonstrated mild dilatation of colon with air-fluid levels with no transition point and distended loops and stomach. CT Angio Chest PE on 04/28/2024 negative for PE but increased bilateral GGO. TEE on 02/21/2024 showed EF 20-25%, LV severely decreased function and global hypokinesis, RV function moderately reduced.    Palliative medicine consulted for goals of care conversation.   SUMMARY OF RECOMMENDATIONS DNR-limited Full scope, family reiterates understanding of limited options Continue current measures with goal of improvement to participate in rehab Lightly discussed possibility of what if things don't get better or if the patient continues to deteriorate PMT will continue to follow for goals of care conversation  Symptom Management:  Per primary team  Code Status: DNR -  Limited (DNR/DNI)  Prognosis: Unable to determine  Discharge Planning: To Be Determined  Discussed with: Ezenduka MD about discussion with patient and wife, goal remains to get strong enough for rehab and is willing to participate to if he can recover some functionality.   Subjective:   Subjective: Chart Reviewed. Updates received. Patient Assessed. Created space and opportunity for patient  and family to explore thoughts and feelings regarding current medical  situation.  Per GI progress note on 04/30/2024 by Nandigam MD, plan to continue current management with relistor  daily, miralax  1 capful TID, with continued improvement in bowel movements and distension, they will sign off at this time.   Today's Discussion:  Met with patient and wife at bedside. Discussed overall progress from prior visit on 04/28/2024, they shared that patient is overall improving. Patient able to converse more compared to initial visit, not as short of breath, and continuing to move his bowels. Patient was able to participate in OT today and making progress towards his goals of becoming more functional again.  Shared with wife and patient that although he is feeling better, the medical team is still concerned due to his volume status, he is now 5 kg heavier than when he initially came showing that his heart failure is still progressing. He was surprised to hear that, but educated that the plan is the same with continuing to diurese him with hopes of a better volume status. Shared with him that he has a high likelihood of being hospitalized again for his pseudo-obstruction as well as his heart failure and that there may come a time continued aggressive measures and hospitalization may not make sense for his goals. He knows that he may have to consider a day when he no longer wants to pursue aggressive interventions, but for now he wants to focus on getting better to participate in rehab.   Review of Systems  Constitutional:  Positive for fatigue.    Objective:   Primary Diagnoses: Present on Admission:  Enterocolitis  Hypokalemia  Acute kidney injury  Elevated troponin  Hypotension  Chronic systolic CHF (congestive heart failure) (HCC)  Paroxysmal atrial flutter (HCC)  HLD (hyperlipidemia)  Anemia of chronic disease  Ogilvie syndrome  Ileus, unspecified (HCC)   Vital Signs:  BP 111/61   Pulse 81   Temp 97.7 F (36.5 C) (Oral)   Resp 19   Ht 6' (1.829 m)   Wt 90.2  kg   SpO2 100%   BMI 26.97 kg/m   Physical Exam Constitutional:      Appearance: He is ill-appearing.  HENT:     Head: Normocephalic.     Nose: Nose normal.     Mouth/Throat:     Mouth: Mucous membranes are dry.  Eyes:     Extraocular Movements: Extraocular movements intact.  Cardiovascular:     Rate and Rhythm: Normal rate.  Pulmonary:     Effort: Pulmonary effort is normal.     Comments: On nasal cannula.  Abdominal:     General: There is distension.     Palpations: Abdomen is soft.     Comments: Soft compared to prior exams.   Skin:    General: Skin is warm.  Neurological:     General: No focal deficit present.     Mental Status: He is alert.     Comments: Short term memory deficit.     Palliative Assessment/Data: 50%   Existing Vynca/ACP Documentation: GOC done 02/23/2024  Thank you for allowing us  to  participate in the care of Chase Combs PMT will continue to support holistically.  I personally spent a total of 35 minutes in the care of the patient today including preparing to see the patient, performing a medically appropriate exam/evaluation, counseling and educating, referring and communicating with other health care professionals, and documenting clinical information in the EHR.   Haliey Romberg E Amelia Macken, PA-C  Palliative Medicine Team  Team Phone # [phone] (Nights/Weekends) 05/01/2024 4:22 PM

## 2024-05-01 NOTE — Plan of Care (Signed)
  Problem: Education: Goal: Knowledge of General Education information will improve Description: Including pain rating scale, medication(s)/side effects and non-pharmacologic comfort measures Outcome: Progressing   Problem: Health Behavior/Discharge Planning: Goal: Ability to manage health-related needs will improve Outcome: Progressing   Problem: Clinical Measurements: Goal: Ability to maintain clinical measurements within normal limits will improve Outcome: Progressing Goal: Will remain free from infection Outcome: Progressing   Problem: Activity: Goal: Risk for activity intolerance will decrease Outcome: Progressing   Problem: Coping: Goal: Level of anxiety will decrease Outcome: Progressing   

## 2024-05-01 NOTE — Progress Notes (Addendum)
 Progress Note  Patient Name: Chase Combs Date of Encounter: 05/01/2024 [facility] Cardiologist: Alm Clay, MD    Interval Summary   Patient feeling well this AM. No chest pain. Denies shortness of breath. No palpitations. Maintaining NSR.   Vital Signs Vitals:   04/30/24 2300 05/01/24 0221 05/01/24 0500 05/01/24 0749  BP: (!) 107/54 (!) 110/58  94/77  Pulse: 83 82  79  Resp: 20 20  (!) 22  Temp: 98.1 F (36.7 C) 98.1 F (36.7 C)  97.7 F (36.5 C)  TempSrc: Oral Axillary  Oral  SpO2: 100% 99%  99%  Weight:   90.2 kg   Height:        Intake/Output Summary (Last 24 hours) at 05/01/2024 1019 Last data filed at 05/01/2024 0300 Gross per 24 hour  Intake 2229.65 ml  Output 3650 ml  Net -1420.35 ml      05/01/2024    5:00 AM 04/30/2024    5:28 AM 04/29/2024    4:20 AM  Last 3 Weights  Weight (lbs) 198 lb 13.7 oz 198 lb 13.7 oz 197 lb 1.5 oz  Weight (kg) 90.2 kg 90.2 kg 89.4 kg      Telemetry/ECG  NSR - Personally Reviewed  Physical Exam  GEN: No acute distress.  Chronically ill appearing. Sitting upright in the bed.  Neck: No JVD Cardiac:  RRR, no murmurs, rubs, or gallops.  Respiratory: Clear to auscultation bilaterally. Breathing unlabored  GI:  nontender, distended  MS: No edema in BLE  Assessment & Plan   Acute on Chronic HFrEF  - Echocardiogram 02/02/24 showed EF 20-25%, moderate LVH, moderately reduced RV systolic function, moderate MR, mild-moderate TR  - Patient is warm and well perfused on exam - He has been on PO lasix  40 mg daily. Also received IV lasix  yesterday. Put out 3.65 L urine. Kidney function stable  - Weight was 187 on arrival, up to 198 today.  - Ordered a dose of IV lasix  40 mg today. Has already received 40 mg PO. K was 2.2 this  morning, but patient has already received PO and IV potassium and magnesium   - Continue spironolactone  25 mg daily. BP cannot tolerate ARB/ARNI or BB at this time   CAD  - Cath in 01/2024 showed 45%  distal LM, ostially occluded LAD (culprit), 50% ISR in ramus. Underwent successful PCI to ostial-prox LAD with balloon dilatation  - Denies chest pain  - continue plavix    Persistent atrial fibrillation  - s/p DCCV 02/2024. Was on PO amiodarone , digoxin , eliquis  PTA  - Had recurrent afib with RVR earlier this admission. Started ON IV amiodarone  and IV heparin   - Converted to NSR yesterday and maintaining NSR this AM  - OK to transition from IV amiodarone  to PO amiodarone  200 mg daily  - Continue IV heparin . Plan to transition back to eliquis  prior to DC   Otherwise per primary  - Diffuse progressive ileus, recurrent Ogilvie syndrome  - Gross hematuria (resolved)  - Hypotension  - AKI on CKD (resolved)  - PUD  - Type 2 dm - Incidental thyroid  nodule  - Chronic normocytic anemia  - Acute debilitation, mechanical fall   For questions or updates, please contact [facility] Please consult [email or web address] for contact info under    Signed, Chase FABIENE Louder, PA-C   Patient seen and examined, note reviewed with the signed Advanced Practice Provider. I personally reviewed laboratory data, imaging studies and relevant notes. I independently examined the patient  and formulated the important aspects of the plan. I have personally discussed the plan with the patient and/or family. Comments or changes to the note/plan are indicated below.  HPI: 78 year old male with history of chronic HFrEF, CAD s/p PCI to ostial to proximal LAD, mild persistent atrial fibrillation s/p DCCV 02/2024.  Admitted with Ogilvie syndrome s/p colonic decompression 02/18/2024,  Currently admits to some worsening shortness of breath.  Currently having his ostomy changed.   My Exam:  Physical Exam Vitals and nursing note reviewed.  Constitutional:      Appearance: Normal appearance.  HENT:     Head: Normocephalic and atraumatic.  Eyes:     Conjunctiva/sclera: Conjunctivae normal.  Pulmonary:      Effort: Pulmonary effort is normal.  Musculoskeletal:        General: Swelling present.  Skin:    Coloration: Skin is not jaundiced or pale.  Neurological:     Mental Status: He is alert.       Assessment & Plan:  Recurrent Ogilvie's syndrome/pseudoobstruction; diffuse progressive ileus Acute hypoxic respiratory failure Acute on chronic HFrEF weight trending up and patient short of breath.  Agree with diuresis as above Ischemic cardiomyopathy Persistent atrial fibrillation Type 2 diabetes AKI on CKD  Time coordinating patient care: 35 minutes  Signed, Emeline Calender, DO [place]  [facility]  05/01/2024 3:05 PM

## 2024-05-01 NOTE — Progress Notes (Signed)
 Occupational Therapy Treatment Patient Details Name: Chase Combs MRN: [id number] DOB: [birth year] Today's Date: 05/01/2024   History of present illness Pt is a 78 y.o. male admitted 11/28 with enterocolitis, presyncope, and hypotension. Multiple recent hospitalizations for cardiac and GI issues.PMH: Ogilvie syndrome s/p colonic decompression, NSTEMI, CAD, heart failure, a fib, HTN, HLD, DMII, CKD, OSA, prostate ca   OT comments  Pt progressing toward goals this session, overall needing min A for LB ADL and for transfers/short distance ambulation in room. Pt needs min A overall for bed mobility. Pt with dizziness upon standing/ambulating, BP 80/50  (60) standing at end of session, pt also with n/v, RN notified. Pt presenting with impairments listed below, will follow acutely. Patient will benefit from continued inpatient follow up therapy, <3 hours/day.  BP 80/50 (60) standing post-ambulation BP 108/74 (85) supine end of session      If plan is discharge home, recommend the following:  A lot of help with bathing/dressing/bathroom;Assistance with cooking/housework;Direct supervision/assist for medications management;Direct supervision/assist for financial management;Help with stairs or ramp for entrance;Assist for transportation;A lot of help with walking and/or transfers   Equipment Recommendations  Other (comment) (defer)    Recommendations for Other Services PT consult    Precautions / Restrictions Precautions Precautions: Fall;Other (comment) Recall of Precautions/Restrictions: Intact Precaution/Restrictions Comments: watch BP Restrictions Weight Bearing Restrictions Per Provider Order: No       Mobility Bed Mobility Overal bed mobility: Needs Assistance Bed Mobility: Rolling, Sidelying to Sit, Sit to Sidelying Rolling: Min assist   Supine to sit: Min assist Sit to supine: Min assist        Transfers Overall transfer level: Needs assistance Equipment used: Rolling  walker (2 wheels) Transfers: Sit to/from Stand Sit to Stand: Min assist                 Balance Overall balance assessment: Needs assistance Sitting-balance support: Feet supported Sitting balance-Leahy Scale: Good Sitting balance - Comments: sits unsupported EOB   Standing balance support: Bilateral upper extremity supported Standing balance-Leahy Scale: Poor Standing balance comment: reliant on RW support                           ADL either performed or assessed with clinical judgement   ADL Overall ADL's : Needs assistance/impaired                     Lower Body Dressing: Minimal assistance Lower Body Dressing Details (indicate cue type and reason): reaches down to pull up socks Toilet Transfer: Minimal assistance;+2 for safety/equipment;Rolling walker (2 wheels)           Functional mobility during ADLs: Minimal assistance;+2 for safety/equipment;Rolling walker (2 wheels)      Extremity/Trunk Assessment Upper Extremity Assessment Upper Extremity Assessment: Generalized weakness   Lower Extremity Assessment Lower Extremity Assessment: Defer to PT evaluation        Vision   Vision Assessment?: No apparent visual deficits   Perception Perception Perception: Not tested   Praxis Praxis Praxis: Not tested   Communication Communication Communication: No apparent difficulties Factors Affecting Communication: Hearing impaired   Cognition Arousal: Alert Behavior During Therapy: WFL for tasks assessed/performed, Anxious Cognition: No apparent impairments                               Following commands: Impaired Following commands impaired: Follows one step commands with increased  time      Cueing   Cueing Techniques: Verbal cues  Exercises      Shoulder Instructions       General Comments orthostatic after room distance ambulation and pt with n/v    Pertinent Vitals/ Pain       Pain Assessment Pain  Assessment: Faces Pain Score: 5  Faces Pain Scale: Hurts even more Pain Location: LUE Pain Descriptors / Indicators: Discomfort Pain Intervention(s): Limited activity within patient's tolerance, Monitored during session, Repositioned  Home Living                                          Prior Functioning/Environment              Frequency  Min 2X/week        Progress Toward Goals  OT Goals(current goals can now be found in the care plan section)  Progress towards OT goals: Progressing toward goals  Acute Rehab OT Goals Patient Stated Goal: none stated OT Goal Formulation: With patient Time For Goal Achievement: 05/11/24 Potential to Achieve Goals: Good ADL Goals Pt Will Perform Upper Body Dressing: with supervision;sitting Pt Will Perform Lower Body Dressing: with supervision;sit to/from stand;sitting/lateral leans Pt Will Transfer to Toilet: with supervision;ambulating;regular height toilet Pt Will Perform Tub/Shower Transfer: Tub transfer;Shower transfer;with supervision;ambulating;rolling walker  Plan      Co-evaluation                 AM-PAC OT 6 Clicks Daily Activity     Outcome Measure   Help from another person eating meals?: A Little Help from another person taking care of personal grooming?: A Little Help from another person toileting, which includes using toliet, bedpan, or urinal?: A Lot Help from another person bathing (including washing, rinsing, drying)?: A Lot Help from another person to put on and taking off regular upper body clothing?: A Little Help from another person to put on and taking off regular lower body clothing?: A Little 6 Click Score: 16    End of Session Equipment Utilized During Treatment: Gait belt;Rolling walker (2 wheels);Oxygen  OT Visit Diagnosis: Unsteadiness on feet (R26.81);Other abnormalities of gait and mobility (R26.89);Muscle weakness (generalized) (M62.81)   Activity Tolerance Patient  tolerated treatment well   Patient Left in bed;with call bell/phone within reach;with bed alarm set   Nurse Communication Mobility status (getting K+)        Time: 8653-8594 OT Time Calculation (min): 19 min  Charges: OT General Charges $OT Visit: 1 Visit OT Treatments $Self Care/Home Management : 8-22 mins  Gurtha Picker K, OTD, OTR/L SecureChat Preferred Acute Rehab [phone]   Marjarie Irion K Koonce 05/01/2024, 2:22 PM

## 2024-05-01 NOTE — Progress Notes (Signed)
 PHARMACY - ANTICOAGULATION CONSULT NOTE  Pharmacy Consult for Heparin  (holding Apixaban ) Indication: atrial fibrillation  Allergies  Allergen Reactions   Codeine Nausea And Vomiting   Latex Rash    Oral blisters when used at dentist    Patient Measurements: Height: 6' (182.9 cm) Weight: 90.2 kg (198 lb 13.7 oz) IBW/kg (Calculated) : 77.6 HEPARIN  DW (KG): 85  Vital Signs: Temp: 97.7 F (36.5 C) (12/08 0749) Temp Source: Oral (12/08 0749) BP: 94/77 (12/08 0749) Pulse Rate: 79 (12/08 0749)  Labs: Recent Labs    04/29/24 0334 04/29/24 1354 04/29/24 1823 04/30/24 0340 04/30/24 1317 05/01/24 0355  HGB 10.9*  --   --  10.4*  --  10.8*  HCT 32.7*  --   --  31.9*  --  32.6*  PLT 290  --   --  276  --  278  APTT  --   --  46* 45* 72* 46*  HEPARINUNFRC  --   --  >1.10* 0.95*  --  0.26*  CREATININE 0.95 0.89  --  0.79  --  0.83    Estimated Creatinine Clearance: 83.1 mL/min (by C-G formula based on SCr of 0.83 mg/dL).  Assessment: 78 y/o M on with increasingly distended abdomen > ileus, on apixaban  PTA for afib, holding apixaban  and starting heparin  in case any procedures are needed, last dose of apixaban  was 12/2 around 1930. Continue using aPTT to dose for now. Pharmacy consulted to manage heparin .  aPTT is 46 sec and heparin  level 0.26 < goal on heparin  drip 1700 uts/hr  No issues with infusion or bleeding reported. CBC stable.   Goal of Therapy:  Heparin  level 0.3-0.7 units/ml aPTT 66-102 seconds Monitor platelets by anticoagulation protocol: Yes   Plan:  Increase heparin  drip 1850 units/hr Daily heparin  levels and CBC Monitor s/s bleeding    Olam Chalk Pharm.D. CPP, BCPS Clinical Pharmacist [phone] 05/01/2024 10:20 AM   **Pharmacist phone directory can be found on [email or web address] listed under [facility]**

## 2024-05-01 NOTE — Telephone Encounter (Signed)
 As per Dr. Joesph email, I have spoken with the patient's spouse, Ms. Laterrian Hevener. Patient is not in good health and due to being in and out of the hospital they would like to self dismiss and request a refund of monies already paid. Request to issue refund sent to [employer] as per Ms. Fahey's request.

## 2024-05-02 LAB — CBC WITH DIFFERENTIAL/PLATELET
Abs Immature Granulocytes: 0.04 K/uL (ref 0.00–0.07)
Basophils Absolute: 0 K/uL (ref 0.0–0.1)
Basophils Relative: 0 %
Eosinophils Absolute: 0.1 K/uL (ref 0.0–0.5)
Eosinophils Relative: 1 %
HCT: 34.9 % — ABNORMAL LOW (ref 39.0–52.0)
Hemoglobin: 11.5 g/dL — ABNORMAL LOW (ref 13.0–17.0)
Immature Granulocytes: 1 %
Lymphocytes Relative: 16 %
Lymphs Abs: 0.9 K/uL (ref 0.7–4.0)
MCH: 30.3 pg (ref 26.0–34.0)
MCHC: 33 g/dL (ref 30.0–36.0)
MCV: 92.1 fL (ref 80.0–100.0)
Monocytes Absolute: 0.5 K/uL (ref 0.1–1.0)
Monocytes Relative: 8 %
Neutro Abs: 4.3 K/uL (ref 1.7–7.7)
Neutrophils Relative %: 74 %
Platelets: 303 K/uL (ref 150–400)
RBC: 3.79 MIL/uL — ABNORMAL LOW (ref 4.22–5.81)
RDW: 18.4 % — ABNORMAL HIGH (ref 11.5–15.5)
WBC: 5.8 K/uL (ref 4.0–10.5)
nRBC: 0 % (ref 0.0–0.2)

## 2024-05-02 LAB — GLUCOSE, CAPILLARY
Glucose-Capillary: 100 mg/dL — ABNORMAL HIGH (ref 70–99)
Glucose-Capillary: 104 mg/dL — ABNORMAL HIGH (ref 70–99)
Glucose-Capillary: 105 mg/dL — ABNORMAL HIGH (ref 70–99)
Glucose-Capillary: 108 mg/dL — ABNORMAL HIGH (ref 70–99)
Glucose-Capillary: 117 mg/dL — ABNORMAL HIGH (ref 70–99)
Glucose-Capillary: 142 mg/dL — ABNORMAL HIGH (ref 70–99)
Glucose-Capillary: 98 mg/dL (ref 70–99)

## 2024-05-02 LAB — BASIC METABOLIC PANEL WITH GFR
Anion gap: 16 — ABNORMAL HIGH (ref 5–15)
BUN: 14 mg/dL (ref 8–23)
CO2: 19 mmol/L — ABNORMAL LOW (ref 22–32)
Calcium: 7.9 mg/dL — ABNORMAL LOW (ref 8.9–10.3)
Chloride: 102 mmol/L (ref 98–111)
Creatinine, Ser: 0.83 mg/dL (ref 0.61–1.24)
GFR, Estimated: >60 mL/min (ref >60)
Glucose, Bld: 110 mg/dL — ABNORMAL HIGH (ref 70–99)
Potassium: 2.9 mmol/L — ABNORMAL LOW (ref 3.5–5.1)
Sodium: 137 mmol/L (ref 135–145)

## 2024-05-02 LAB — HEPARIN LEVEL (UNFRACTIONATED): Heparin Unfractionated: 0.39 [IU]/mL (ref 0.30–0.70)

## 2024-05-02 LAB — MAGNESIUM: Magnesium: 2 mg/dL (ref 1.7–2.4)

## 2024-05-02 MED ORDER — POTASSIUM CHLORIDE 10 MEQ/100ML IV SOLN
10.0000 meq | INTRAVENOUS | Status: AC
  Administered 2024-05-02 (×4): 10 meq via INTRAVENOUS
  Filled 2024-05-02 (×4): qty 100

## 2024-05-02 MED ORDER — PANTOPRAZOLE SODIUM 40 MG PO TBEC
40.0000 mg | DELAYED_RELEASE_TABLET | Freq: Every day | ORAL | Status: DC
  Administered 2024-05-03 – 2024-05-11 (×9): 40 mg via ORAL
  Filled 2024-05-02 (×8): qty 1

## 2024-05-02 NOTE — Progress Notes (Signed)
 Physical Therapy Treatment Patient Details Name: Chase Combs MRN: [id number] DOB: 05-24-1946 Today's Date: 05/02/2024   History of Present Illness Pt is a 78 y.o. male admitted 11/28 with enterocolitis, presyncope, and hypotension. Multiple recent hospitalizations for cardiac and GI issues.PMH: Ogilvie syndrome s/p colonic decompression, NSTEMI, CAD, heart failure, a fib, HTN, HLD, DMII, CKD, OSA, prostate ca    PT Comments  Pt resting in bed on arrival, agreeable to session with encouragement as pt reporting increased fatigue this date. Pt demonstrating bed mobility and transfers sit<>stand with grossly min A to complete, however standing tolerance limited by dizziness with slight drop in BP (see below) with pt requesting to return to sitting. Encouraged pt to rest with HOB elevated as tolerated for increased tolerance of upright sitting and BP compliance with pt verbalizing understanding and agreeable to [facility] at 21 degrees at end of session. Pt continues to benefit from skilled PT services to progress toward functional mobility goals.    BP readings: Supine 96/60(71) Sitting EOB 104/57(71) Sitting after standing briefly 95/56 (68)    If plan is discharge home, recommend the following: A lot of help with walking and/or transfers;A lot of help with bathing/dressing/bathroom;Assistance with cooking/housework;Assist for transportation;Help with stairs or ramp for entrance   Can travel by private vehicle     No  Equipment Recommendations  None recommended by PT    Recommendations for Other Services       Precautions / Restrictions Precautions Precautions: Fall;Other (comment) Recall of Precautions/Restrictions: Intact Precaution/Restrictions Comments: watch BP Restrictions Weight Bearing Restrictions Per Provider Order: No     Mobility  Bed Mobility Overal bed mobility: Needs Assistance Bed Mobility: Supine to Sit, Sit to Supine     Supine to sit: Min assist Sit to supine:  Min assist   General bed mobility comments: Pt requires assist with lines and trunk support. Pt cued to use rails as needed to get EOB, pt requesting to use PTA hand for better stability. Requires increased time    Transfers Overall transfer level: Needs assistance Equipment used: Rolling walker (2 wheels) Transfers: Sit to/from Stand Sit to Stand: Min assist           General transfer comment: pt standing from EOB at lowest height, min A needed to facilitate anterior weight shift and boost to standing    Ambulation/Gait Ambulation/Gait assistance: Min assist Gait Distance (Feet): 2 Feet Assistive device: Rolling walker (2 wheels) Gait Pattern/deviations: Step-to pattern Gait velocity: dec     General Gait Details: pt taking a few side steps along EOB to HOB, pt declining further distance due to fatigue and dizziness   Stairs             Wheelchair Mobility     Tilt Bed    Modified Rankin (Stroke Patients Only)       Balance Overall balance assessment: Needs assistance Sitting-balance support: Feet supported Sitting balance-Leahy Scale: Good Sitting balance - Comments: sits unsupported EOB   Standing balance support: Bilateral upper extremity supported Standing balance-Leahy Scale: Poor Standing balance comment: reliant on RW support                            Communication Communication Communication: No apparent difficulties Factors Affecting Communication: Hearing impaired  Cognition Arousal: Alert Behavior During Therapy: WFL for tasks assessed/performed, Anxious  Following commands: Impaired Following commands impaired: Follows one step commands with increased time    Cueing Cueing Techniques: Verbal cues  Exercises      General Comments General comments (skin integrity, edema, etc.): BP supine 96/60(71), sitting EOB 104/57(71), sitting after standing briefly 95/56 (68)      Pertinent  Vitals/Pain Pain Assessment Pain Assessment: Faces Faces Pain Scale: Hurts even more Pain Location: gneralized Pain Descriptors / Indicators: Discomfort Pain Intervention(s): Monitored during session, Limited activity within patient's tolerance    Home Living                          Prior Function            PT Goals (current goals can now be found in the care plan section) Acute Rehab PT Goals Patient Stated Goal: get stronger and return home PT Goal Formulation: With patient Time For Goal Achievement: 05/09/24 Progress towards PT goals: Progressing toward goals    Frequency    Min 2X/week      PT Plan      Co-evaluation              AM-PAC PT 6 Clicks Mobility   Outcome Measure  Help needed turning from your back to your side while in a flat bed without using bedrails?: A Lot Help needed moving from lying on your back to sitting on the side of a flat bed without using bedrails?: A Lot Help needed moving to and from a bed to a chair (including a wheelchair)?: Total Help needed standing up from a chair using your arms (e.g., wheelchair or bedside chair)?: Total Help needed to walk in hospital room?: Total Help needed climbing 3-5 steps with a railing? : Total 6 Click Score: 8    End of Session Equipment Utilized During Treatment: Oxygen Activity Tolerance: Patient limited by fatigue Patient left: in bed;with call bell/phone within reach;with family/visitor present Nurse Communication: Mobility status PT Visit Diagnosis: Other abnormalities of gait and mobility (R26.89);Muscle weakness (generalized) (M62.81)     Time: 8548-8490 PT Time Calculation (min) (ACUTE ONLY): 18 min  Charges:    $Therapeutic Activity: 8-22 mins PT General Charges $$ ACUTE PT VISIT: 1 Visit                     Vu Liebman R. PTA Acute Rehabilitation Services Office: [phone]   [name] 05/02/2024, 4:03 PM

## 2024-05-02 NOTE — Progress Notes (Signed)
 PROGRESS NOTE  Chase Combs FMW:[id number] DOB: 05/07/1946 DOA: 04/21/2024 PCP: Wonda Worth SQUIBB, PA   HPI/Recap of past 24 hours: 78 y.o. 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 Of note, patient had recent complicated admission for septic shock, cardiogenic shock, intestinal pneumatosis/ileus/pseudoobstruction/Ogilvie syndrome. Now admitted for recurrent Ogilvie syndrome, suspected gastroenterocolitis, AKI on CKD, hypotension and acute debilitation.       Today, patient slowly improving, now back in normal sinus rhythm with better rate control.  Able to tolerate orally.  SOB subjectively improving.  Patient still appears overall deconditioned.    Assessment/Plan: Principal Problem:   Enterocolitis Active Problems:   Ogilvie syndrome   Chronic systolic CHF (congestive heart failure) (HCC)   Hypokalemia   HLD (hyperlipidemia)   Acute kidney injury   DM2 (diabetes mellitus, type 2) (HCC)   Postural dizziness with presyncope   Ileus, unspecified (HCC)   Elevated troponin   Fall at home, initial encounter   Hypotension   Paroxysmal atrial flutter (HCC)   Anemia of chronic disease   Adynamic ileus (HCC)   Chronic combined systolic and diastolic congestive heart failure (HCC)    Diffuse progressive ileus Recurrent Ogilvie Syndrome/pseudoobstruction History of recent hospitalization (01/22/24-02/25/24) for similar GI symptoms with extensive workup. Underwent colonic  decompression (02/18/24) with improvement Currently afebrile, with no leukocytosis C diff and enteric PCR panel negative CT A/P w/o contrast with noted diffuse mild dilatation of colon, distended ileal loops and stomach Repeat abdominal x-ray on 12/3 showed diffuse ileus involving colon and small bowel GI consulted, appreciate recs, continue MiraLAX , rectal tube, high risk for complications with sedation for colonic decompression Continue soft diet Aspiration precautions Monitor closely  Acute hypoxic respiratory failure Currently requiring about 2-3 L of O2, improving tachypenia ABG showed pH 7.37, pO2 75, CO2 40 D-dimer mildly elevated Procalcitonin 0.26 Likely in the setting of abdominal distension from underlying ileus/pseudoobstruction/gastroenterocolitis in addition to advanced CHF (required IV fluid resuscitation due to AKI) CTA chest showing no PE, but new and increased bilateral ground glass opacity, airway thickening which may reflect atypical pneumonia, acute eosinophilic pneumonia, hypersensitivity pneumonitis, pulmonary edema or pulmonary hemorrhage, noted mucus in the right mainstem bronchus PCCM consulted Completed 5 days of IV antibiotics, continue DuoNebs, nebulizers, Mucomyst  Supplemental O2 as needed  CHFrEF Ischemic cardiomyopathy Recent prolonged hospitalization complicated by cardiogenic shock with acute CHF exacerbation in the setting of LAD stent stenosis  BNP 1984 Last echo 01/2024 with LVEF 20-25% CXR 12/2 showed bilateral opacities Cardiology consulted, appreciate recs Continue lasix , spironolactone , continue to hold entresto , digoxin , Jardiance  Monitor I/Os, daily weights, renal function  Persistent Afib/flutter with RVR Heart rate improved, now back in sinus rhythm as of 12/7 S/p TEE CV on 02/21/2024 Cardiology on board, appreciate recs Continue amiodarone , IV heparin  Eliquis  held Telemetry  Gross hematuria Resolved Noted hematuria on 12/4 Renal  ultrasound showed enlarged prostate protruding into the inferior bladder, layering debris's in the bladder could reflect blood clot Urology consulted, placed foley on 12/5 Continue flomax  Monitor closely while on IV heparin   CAD s/p DES to LAD 8/25 Hyperlipidemia  Recent balloon angioplasty of LAD stent thrombosis (02/01/2024) due to inability to tolerate oral DAPT  History of remote PCI to RI and RCA EKG with known RBBB, LAFB, no significant changes from prior Troponin 67 < 71 Cardiology consulted, appreciate recs Cont home plavix , held Eliquis , continue Crestor  Telemetry  Hypotension BP soft CT head unremarkable S/p IV fluids  Hypokalemia/hypomagnesemia Likely in the setting of ileus, frequent diarrhea Monitor and replete electrolytes as needed   AKI on CKD stage II Resolved Cr 1.11 < 2.1, baseline 0.9-1.5 Likely in the setting of dehydration from diarrhea CT A/P with no concern for obstructive disease Avoid nephrotoxic agents Renal dose medications Monitor BMP   Peptic ulcer disease Recent hospitalization with EGD (02/02/24) showing esophageal ulcer without bleeding, grade C reflux esophagitis without bleeding, duodenal erosions Cont pantoprazole    T2DM Hold home Jardiance  Cont SSI   Incidental R thyroid  nodule Noted on CT C spine TSH normal Will need outpatient thyroid  US    Chronic normocytic anemia Hgb 11.2, baseline 10-12 Daily CBC   Acute debilitation Mechanical fall Monitor on fall precautions PT/OT following- rec SNF  GOC discussion Patient with poor prognosis, recurrent severe ileus, advanced CHF Palliative care consulted, appreciate recs Currently DNR   Malnutrition Type:  Nutrition Problem: Moderate Malnutrition Etiology: chronic illness   Malnutrition Characteristics:  Signs/Symptoms: moderate fat depletion, severe muscle depletion, moderate muscle depletion   Nutrition Interventions:  Interventions: Refer to RD note for  recommendations    Estimated body mass index is 25.24 kg/m as calculated from the following:   Height as of this encounter: 6' (1.829 m).   Weight as of this encounter: 84.4 kg.     Code Status: DNR  Family Communication: Discussed with wife over the phone on 12/5  Disposition Plan: Status is: Inpatient Remains inpatient appropriate because: Level of care      Consultants: GI Cardiology PCCM Urology  Procedures: None  Antimicrobials: Completed  DVT prophylaxis: IV heparin    Objective: Vitals:   05/02/24 0333 05/02/24 0800 05/02/24 0900 05/02/24 1105  BP: (!) 106/51 102/61  99/71  Pulse: 76 (!) 115 69 68  Resp: 16 (!) 27 (!) 25 18  Temp: 98.2 F (36.8 C) 97.6 F (36.4 C)  97.9 F (36.6 C)  TempSrc: Oral Oral  Oral  SpO2: 98% 99% 99% 99%  Weight: 84.4 kg     Height:        Intake/Output Summary (Last 24 hours) at 05/02/2024 1210 Last data filed at 05/02/2024 0730 Gross per 24 hour  Intake 1531.6 ml  Output 3400 ml  Net -1868.4 ml   Filed Weights   04/30/24 0528 05/01/24 0500 05/02/24 0333  Weight: 90.2 kg 90.2 kg 84.4 kg    Exam: General: NAD, awake/alert, deconditioned Cardiovascular: S1, S2 present Respiratory: Diminished BS bilaterally Abdomen: Soft, nontender, +distended, bowel sounds present Musculoskeletal: No bilateral pedal edema noted Skin: Normal Psychiatry: fair mood     Data Reviewed: CBC: Recent Labs  Lab 04/28/24 0016 04/29/24 0334 04/30/24 0340 05/01/24 0355 05/02/24 0408  WBC 8.2 6.0 6.3 5.4 5.8  NEUTROABS 6.2 5.6 5.0 3.9 4.3  HGB 12.2* 10.9* 10.4* 10.8* 11.5*  HCT 37.7* 32.7* 31.9* 32.6* 34.9*  MCV 91.3 90.1 90.6 90.3 92.1  PLT 373 290 276 278 303   Basic Metabolic Panel: Recent Labs  Lab 04/25/24 2339 04/26/24 0734 04/29/24 0334 04/29/24 1354 04/30/24 0340 04/30/24 0756 05/01/24 0354 05/01/24 0355 05/01/24 1558 05/02/24 0408  NA 136   < > 134* 135 134*  --   --  138 137 137  K 4.3   < > 2.6* 3.5  3.1*  --   --  2.2* 3.2* 2.9*  CL 110   < > 106 107 106  --   --  101 103 102  CO2 22   < > 19* 19* 22  --   --  26 31 19*  GLUCOSE 169*   < > 149* 143* 109*  --   --  95 115* 110*  BUN 45*   < > 33* 29* 22  --   --  18 16 14   CREATININE 1.09   < > 0.95 0.89 0.79  --   --  0.83 0.99 0.83  CALCIUM  8.4*   < > 7.9* 7.9* 7.6*  --   --  7.7* 7.8* 7.9*  MG 2.3  --  2.0  --   --  1.7 1.5*  --   --  2.0   < > = values in this interval not displayed.   GFR: Estimated Creatinine Clearance: 83.1 mL/min (by C-G formula based on SCr of 0.83 mg/dL). Liver Function Tests: No results for input(s): AST, ALT, ALKPHOS, BILITOT, PROT, ALBUMIN in the last 168 hours.  No results for input(s): LIPASE, AMYLASE in the last 168 hours. No results for input(s): AMMONIA in the last 168 hours. Coagulation Profile: No results for input(s): INR, PROTIME in the last 168 hours.  Cardiac Enzymes: No results for input(s): CKTOTAL, CKMB, CKMBINDEX, TROPONINI in the last 168 hours. BNP (last 3 results) Recent Labs    01/07/24 2301  PROBNP 15,245.0*   HbA1C: No results for input(s): HGBA1C in the last 72 hours. CBG: Recent Labs  Lab 05/01/24 2009 05/02/24 0015 05/02/24 0432 05/02/24 0803 05/02/24 1107  GLUCAP 133* 105* 108* 142* 98   Lipid Profile: No results for input(s): CHOL, HDL, LDLCALC, TRIG, CHOLHDL, LDLDIRECT in the last 72 hours. Thyroid  Function Tests: No results for input(s): TSH, T4TOTAL, FREET4, T3FREE, THYROIDAB in the last 72 hours. Anemia Panel: No results for input(s): VITAMINB12, FOLATE, FERRITIN, TIBC, IRON, RETICCTPCT in the last 72 hours. Urine analysis:    Component Value Date/Time   COLORURINE YELLOW 04/22/2024 1205   APPEARANCEUR CLEAR 04/22/2024 1205   LABSPEC >1.030 (H) 04/22/2024 1205   PHURINE 5.5 04/22/2024 1205   GLUCOSEU NEGATIVE 04/22/2024 1205   HGBUR NEGATIVE 04/22/2024 1205   BILIRUBINUR NEGATIVE  04/22/2024 1205   KETONESUR NEGATIVE 04/22/2024 1205   PROTEINUR NEGATIVE 04/22/2024 1205   UROBILINOGEN 0.2 02/17/2009 0645   NITRITE NEGATIVE 04/22/2024 1205   LEUKOCYTESUR NEGATIVE 04/22/2024 1205   Sepsis Labs: @LABRCNTIP (procalcitonin:4,lacticidven:4)  ) Recent Results (from the past 240 hours)  C Difficile Quick Screen w PCR reflex     Status: None   Collection Time: 04/22/24  6:33 PM   Specimen: STOOL  Result Value Ref Range Status   C Diff antigen NEGATIVE NEGATIVE Final   C Diff toxin NEGATIVE NEGATIVE Final   C Diff interpretation No C. difficile detected.  Final    Comment: Performed at [facility], [address] [place], [place] [address]  Gastrointestinal Panel by PCR , Stool     Status: None   Collection Time: 04/22/24  6:33 PM   Specimen: Stool  Result Value Ref Range Status   Campylobacter species NOT DETECTED NOT DETECTED Final   Plesimonas shigelloides NOT DETECTED NOT DETECTED Final   Salmonella species NOT DETECTED NOT DETECTED Final   Yersinia enterocolitica NOT DETECTED NOT DETECTED Final   Vibrio  species NOT DETECTED NOT DETECTED Final   Vibrio cholerae NOT DETECTED NOT DETECTED Final   Enteroaggregative E coli (EAEC) NOT DETECTED NOT DETECTED Final   Enteropathogenic E coli (EPEC) NOT DETECTED NOT DETECTED Final   Enterotoxigenic E coli (ETEC) NOT DETECTED NOT DETECTED Final   Shiga like toxin producing E coli (STEC) NOT DETECTED NOT DETECTED Final   Shigella/Enteroinvasive E coli (EIEC) NOT DETECTED NOT DETECTED Final   Cryptosporidium NOT DETECTED NOT DETECTED Final   Cyclospora cayetanensis NOT DETECTED NOT DETECTED Final   Entamoeba histolytica NOT DETECTED NOT DETECTED Final   Giardia lamblia NOT DETECTED NOT DETECTED Final   Adenovirus F40/41 NOT DETECTED NOT DETECTED Final   Astrovirus NOT DETECTED NOT DETECTED Final   Norovirus GI/GII NOT DETECTED NOT DETECTED Final   Rotavirus A NOT DETECTED NOT DETECTED Final   Sapovirus (I, II,  IV, and V) NOT DETECTED NOT DETECTED Final    Comment: Performed at [facility], [address] [place], [place] [address]  Respiratory (~20 pathogens) panel by PCR     Status: None   Collection Time: 04/28/24  6:06 PM   Specimen: Nasopharyngeal Swab; Respiratory  Result Value Ref Range Status   Adenovirus NOT DETECTED NOT DETECTED Final   Coronavirus 229E NOT DETECTED NOT DETECTED Final    Comment: (NOTE) The Coronavirus on the Respiratory Panel, DOES NOT test for the novel  Coronavirus (2019 nCoV)    Coronavirus HKU1 NOT DETECTED NOT DETECTED Final   Coronavirus NL63 NOT DETECTED NOT DETECTED Final   Coronavirus OC43 NOT DETECTED NOT DETECTED Final   Metapneumovirus NOT DETECTED NOT DETECTED Final   Rhinovirus / Enterovirus NOT DETECTED NOT DETECTED Final   Influenza A NOT DETECTED NOT DETECTED Final   Influenza B NOT DETECTED NOT DETECTED Final   Parainfluenza Virus 1 NOT DETECTED NOT DETECTED Final   Parainfluenza Virus 2 NOT DETECTED NOT DETECTED Final   Parainfluenza Virus 3 NOT DETECTED NOT DETECTED Final   Parainfluenza Virus 4 NOT DETECTED NOT DETECTED Final   Respiratory Syncytial Virus NOT DETECTED NOT DETECTED Final   Bordetella pertussis NOT DETECTED NOT DETECTED Final   Bordetella Parapertussis NOT DETECTED NOT DETECTED Final   Chlamydophila pneumoniae NOT DETECTED NOT DETECTED Final   Mycoplasma pneumoniae NOT DETECTED NOT DETECTED Final    Comment: Performed at [facility], [address], [place], [place] [address]  MRSA Next Gen by PCR, Nasal     Status: None   Collection Time: 04/29/24  4:31 PM   Specimen: Nasal Mucosa; Nasal Swab  Result Value Ref Range Status   MRSA by PCR Next Gen NOT DETECTED NOT DETECTED Final    Comment: (NOTE) The GeneXpert MRSA Assay (FDA approved for NASAL specimens only), is one component of a comprehensive MRSA colonization surveillance program. It is not intended to diagnose MRSA infection nor to guide or monitor  treatment for MRSA infections. Test performance is not FDA approved in patients less than 87 years old. Performed at [facility], [address], [place], [place] [address]       Studies: No results found.     Scheduled Meds:  acetylcysteine   2 mL Nebulization BID   amiodarone   200 mg Oral Daily   budesonide  (PULMICORT ) nebulizer solution  0.25 mg Nebulization BID   Chlorhexidine  Gluconate Cloth  6 each Topical Daily   clopidogrel   75 mg Oral Daily   feeding supplement  1 Container Oral TID BM   fiber supplement (  BANATROL TF)  60 mL Oral BID   furosemide   40 mg Oral Daily   insulin  aspart  0-6 Units Subcutaneous Q4H   ipratropium  0.5 mg Nebulization BID   lidocaine   1 Application Urethral Once   pantoprazole   40 mg Oral Daily   polyethylene glycol  17 g Oral TID WC   revefenacin   175 mcg Nebulization Daily   rosuvastatin   20 mg Oral Daily   spironolactone   25 mg Oral Daily   tamsulosin   0.4 mg Oral QPC supper    Continuous Infusions:  heparin  1,850 Units/hr (05/02/24 0425)   potassium chloride  10 mEq (05/02/24 1117)     LOS: 10 days     Lebron JINNY Cage, MD [facility]  If 7PM-7AM, please contact night-coverage [email or web address] 05/02/2024, 12:10 PM

## 2024-05-02 NOTE — Progress Notes (Addendum)
  Progress Note  Patient Name: Chase Combs Date of Encounter: 05/02/2024 [facility] Cardiologist: Alm Clay, MD   Interval Summary   Feeling better today.  Denies any shortness of breath.  Vital Signs Vitals:   05/02/24 0000 05/02/24 0333 05/02/24 0800 05/02/24 0900  BP: 112/66 (!) 106/51 102/61   Pulse: 80 76 (!) 115 69  Resp: 20 16 (!) 27 (!) 25  Temp: 98.2 F (36.8 C) 98.2 F (36.8 C) 97.6 F (36.4 C)   TempSrc: Oral Oral Oral   SpO2: 97% 98% 99% 99%  Weight:  84.4 kg    Height:        Intake/Output Summary (Last 24 hours) at 05/02/2024 0945 Last data filed at 05/02/2024 0730 Gross per 24 hour  Intake 1531.6 ml  Output 3400 ml  Net -1868.4 ml      05/02/2024    3:33 AM 05/01/2024    5:00 AM 04/30/2024    5:28 AM  Last 3 Weights  Weight (lbs) 186 lb 1.1 oz 198 lb 13.7 oz 198 lb 13.7 oz  Weight (kg) 84.4 kg 90.2 kg 90.2 kg      Telemetry/ECG  Normal sinus rhythm- Personally Reviewed  Physical Exam  GEN: No acute distress.   Neck: No JVD Cardiac: Pulse regular Respiratory: No respiratory distress GI: Soft, nontender, non-distended  MS: No edema  Assessment & Plan   Persistent A-Fib/flutter with RVR Recently diagnosed, s/p recent DCCV  Home meds: amiodarone  200 mg daily, digoxin  0.0625 mg daily, Eliquis  5 mg BID Currently in normal sinus rhythm Started on IV amiodarone , converted to PO 12/8  Continue IV heparin  in setting of Eliquis  holiday, transition back prior to discharge  Continue PO amiodarone  200 mg daily    Coronary artery disease s/p DES to LAD 12/2023, prior PCI 2006, 2010 Hyperlipidemia  01/18/2024: HDL 37; LDL Cholesterol 49 04/23/2024: ALT 66  R/LHC 12/2023: severe single vessel CAD, stent to ostial/prox LAD, patent ramus intermedius stent, patent prox RCA stent  LHC 01/2024: ostially occluded stent with acute stent thrombosis in LAD, PTCA with balloon dilation, successful  Complex antiplatelet therapy history: on DAPT then  discontinued due to GI bleed, then started on IV cangrelor , then transitioned to Plavix  followed by stent thrombosis few hours later Finally placed on Plavix  75 mg daily as genetic 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  Continue Crestor  20 mg daily Continue Plavix  75 mg daily   Acute on Chronic HFrEF  Ischemic cardiomyopathy  Hypertension LVEF 20-25%, RV moderately reduced  Home meds: digoxin  0.0625 mg daily, PO Lasix  40 mg BID with 20 mEq K BID, Entresto  24-26 mg BID, spironolactone  12.5 mg daily, Jardiance  10 mg daily No beta-blocker with recent low output  Appears euvolemic on exam  BP stable Renal function stable Weight went from 198 lb to 186 lb overnight, therefore unreliable  Out 3.4 L yesterday with IV Lasix  40 mg + PO Lasix  40 mg   Continue daily weights, daily BMPs, strict I&O's  Continue spironolactone  25 mg daily  Continue PO Lasix  40 mg daily, hold IV   Per primary Diffuse progressive ileus, recurrent Ogilvie syndrome  Gross hematuria (resolved)  Hypokalemia Hypotension  AKI on CKD (resolved)  PUD  Type 2 DM Incidental thyroid  nodule  Chronic normocytic anemia  Acute debilitation, mechanical fall   For questions or updates, please contact  [facility] Please consult [email or web address] for contact info under   Signed, Emeline FORBES Calender, DO

## 2024-05-02 NOTE — Plan of Care (Signed)
  Problem: Clinical Measurements: Goal: Will remain free from infection Outcome: Progressing   Problem: Activity: Goal: Risk for activity intolerance will decrease Outcome: Progressing   Problem: Nutrition: Goal: Adequate nutrition will be maintained Outcome: Progressing   Problem: Elimination: Goal: Will not experience complications related to bowel motility Outcome: Progressing Goal: Will not experience complications related to urinary retention Outcome: Progressing   Problem: Elimination: Goal: Will not experience complications related to urinary retention Outcome: Progressing

## 2024-05-02 NOTE — Progress Notes (Signed)
 PHARMACY - ANTICOAGULATION CONSULT NOTE  Pharmacy Consult for Heparin  (holding Apixaban ) Indication: atrial fibrillation  Allergies  Allergen Reactions   Codeine Nausea And Vomiting   Latex Rash    Oral blisters when used at dentist    Patient Measurements: Height: 6' (182.9 cm) Weight: 84.4 kg (186 lb 1.1 oz) IBW/kg (Calculated) : 77.6 HEPARIN  DW (KG): 85  Vital Signs: Temp: 97.6 F (36.4 C) (12/09 0800) Temp Source: Oral (12/09 0800) BP: 102/61 (12/09 0800) Pulse Rate: 115 (12/09 0800)  Labs: Recent Labs    04/30/24 0340 04/30/24 1317 05/01/24 0355 05/01/24 1558 05/02/24 0408  HGB 10.4*  --  10.8*  --  11.5*  HCT 31.9*  --  32.6*  --  34.9*  PLT 276  --  278  --  303  APTT 45* 72* 46*  --   --   HEPARINUNFRC 0.95*  --  0.26*  --  0.39  CREATININE 0.79  --  0.83 0.99 0.83    Estimated Creatinine Clearance: 83.1 mL/min (by C-G formula based on SCr of 0.83 mg/dL).  Assessment: 78 y/o M on with increasingly distended abdomen > ileus, on apixaban  PTA for afib, holding apixaban  and starting heparin  in case any procedures are needed, last dose of apixaban  was 12/2 around 1930.   Heparin  level 0.39 (therapeutic) on infusion at 1850 units/hr. No bleeding noted.  Goal of Therapy:  Heparin  level 0.3-0.7 units/ml Monitor platelets by anticoagulation protocol: Yes   Plan:  Continue heparin  drip at 1850 units/hr Daily heparin  levels and CBC Monitor s/s bleeding   Vito Ralph, PharmD, BCPS Please see amion for complete clinical pharmacist phone list 05/02/2024 8:56 AM

## 2024-05-02 NOTE — Care Management Important Message (Signed)
 Important Message  Patient Details  Name: Chase Combs MRN: [id number] Date of Birth: 03/21/1946   Important Message Given:  Yes - Medicare IM     [name] 05/02/2024, 11:43 AM

## 2024-05-03 DIAGNOSIS — Z7189 Other specified counseling: Secondary | ICD-10-CM | POA: Diagnosis not present

## 2024-05-03 DIAGNOSIS — I5023 Acute on chronic systolic (congestive) heart failure: Secondary | ICD-10-CM | POA: Diagnosis not present

## 2024-05-03 DIAGNOSIS — I4819 Other persistent atrial fibrillation: Secondary | ICD-10-CM | POA: Diagnosis not present

## 2024-05-03 LAB — GLUCOSE, CAPILLARY
Glucose-Capillary: 103 mg/dL — ABNORMAL HIGH (ref 70–99)
Glucose-Capillary: 106 mg/dL — ABNORMAL HIGH (ref 70–99)
Glucose-Capillary: 110 mg/dL — ABNORMAL HIGH (ref 70–99)
Glucose-Capillary: 93 mg/dL (ref 70–99)
Glucose-Capillary: 94 mg/dL (ref 70–99)
Glucose-Capillary: 97 mg/dL (ref 70–99)

## 2024-05-03 LAB — CBC WITH DIFFERENTIAL/PLATELET
Abs Immature Granulocytes: 0.04 K/uL (ref 0.00–0.07)
Basophils Absolute: 0 K/uL (ref 0.0–0.1)
Basophils Relative: 0 %
Eosinophils Absolute: 0.1 K/uL (ref 0.0–0.5)
Eosinophils Relative: 2 %
HCT: 31.8 % — ABNORMAL LOW (ref 39.0–52.0)
Hemoglobin: 10.2 g/dL — ABNORMAL LOW (ref 13.0–17.0)
Immature Granulocytes: 1 %
Lymphocytes Relative: 18 %
Lymphs Abs: 0.9 K/uL (ref 0.7–4.0)
MCH: 29.4 pg (ref 26.0–34.0)
MCHC: 32.1 g/dL (ref 30.0–36.0)
MCV: 91.6 fL (ref 80.0–100.0)
Monocytes Absolute: 0.5 K/uL (ref 0.1–1.0)
Monocytes Relative: 11 %
Neutro Abs: 3.2 K/uL (ref 1.7–7.7)
Neutrophils Relative %: 68 %
Platelets: 335 K/uL (ref 150–400)
RBC: 3.47 MIL/uL — ABNORMAL LOW (ref 4.22–5.81)
RDW: 17.9 % — ABNORMAL HIGH (ref 11.5–15.5)
Smear Review: NORMAL
WBC: 4.7 K/uL (ref 4.0–10.5)
nRBC: 0 % (ref 0.0–0.2)

## 2024-05-03 LAB — BASIC METABOLIC PANEL WITH GFR
Anion gap: 6 (ref 5–15)
BUN: 11 mg/dL (ref 8–23)
CO2: 27 mmol/L (ref 22–32)
Calcium: 8 mg/dL — ABNORMAL LOW (ref 8.9–10.3)
Chloride: 101 mmol/L (ref 98–111)
Creatinine, Ser: 0.81 mg/dL (ref 0.61–1.24)
GFR, Estimated: >60 mL/min (ref >60)
Glucose, Bld: 87 mg/dL (ref 70–99)
Potassium: 2.8 mmol/L — ABNORMAL LOW (ref 3.5–5.1)
Sodium: 134 mmol/L — ABNORMAL LOW (ref 135–145)

## 2024-05-03 LAB — HEPARIN LEVEL (UNFRACTIONATED): Heparin Unfractionated: 0.3 [IU]/mL (ref 0.30–0.70)

## 2024-05-03 MED ORDER — POTASSIUM CHLORIDE 20 MEQ PO PACK
40.0000 meq | PACK | Freq: Once | ORAL | Status: AC
  Administered 2024-05-03: 40 meq via ORAL
  Filled 2024-05-03: qty 2

## 2024-05-03 MED ORDER — POTASSIUM CHLORIDE 10 MEQ/100ML IV SOLN
10.0000 meq | INTRAVENOUS | Status: AC
  Administered 2024-05-03 (×4): 10 meq via INTRAVENOUS
  Filled 2024-05-03 (×4): qty 100

## 2024-05-03 NOTE — Progress Notes (Signed)
 Ok to replace with 4 runs of IV KCL and 1 dose of PO KCL 40meq for k 2.8 per Dr. Lue.  Sergio Batch, PharmD, BCIDP, AAHIVP, CPP Infectious Disease Pharmacist 05/03/2024 7:52 AM

## 2024-05-03 NOTE — Plan of Care (Signed)
  Problem: Clinical Measurements: Goal: Will remain free from infection Outcome: Progressing   Problem: Nutrition: Goal: Adequate nutrition will be maintained Outcome: Progressing   Problem: Safety: Goal: Ability to remain free from injury will improve Outcome: Progressing   Problem: Skin Integrity: Goal: Risk for impaired skin integrity will decrease Outcome: Progressing   

## 2024-05-03 NOTE — Progress Notes (Signed)
 Daily Progress Note   Date: 05/03/2024   Patient Name: Chase Combs  DOB: Nov 21, 1945  MRN: [id number]  Age / Sex: 78 y.o., male  Attending Physician: Lue Elsie BROCKS, MD Primary Care Physician: Wonda Worth SQUIBB, PA Admit Date: 04/21/2024 Length of Stay: 11 days  Reason for Follow-up: Establishing goals of care  Past Medical History:  Diagnosis Date   Abdominal hernia    per pt   Arthritis    hands   Benign localized prostatic hyperplasia with lower urinary tract symptoms (LUTS)    CAD (coronary artery disease) 05/2004   cardiologist--- dr anner;  positive stress test , had outpt cardiac cath 05-27-2004 stenosis RI;  06-18-2004 cath w/ PCI and BMS x1 to pRI;   STEMI 02-16-2009  s/p cath w/ PCI thrombectomy for total occluded RCA , BMS x1 to pRCA;  nuclear stress test 03-15-2012 low risk no ischemia but sig inferior-inferolateral infarct , ef 51%   Dyslipidemia    Essential hypertension    Heart murmur    History of COVID-19 05/2020   per pt mild symptoms that resolved   History of ST elevation myocardial infarction (STEMI) 02/16/2009   inferior STEMI   cath w/ pci w/ BMS to pRCA   History of urinary retention    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   urologist--- dr borden/ radiation oncology-- dr patrcia;  dx 12/ 2022,  Gleason 4+5, PSA 24.6, volume 94.6cc;  plan to start IMRT   OSA (obstructive sleep apnea)    per pt dx approx 2008, no cpap intolerant   PONV (postoperative nausea and vomiting)    Pre-diabetes    S/p bare metal coronary artery stent    01/ 2006  x1 BMS to pRI (CoStar study stent);  and 09/ 2010  x1 BMS to pRCA   Status post total knee replacement, 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    Per H&P on 04/22/2024 by [facility] MD, patient presented after a couple days of N/V/D with decreased PO intake. Patient reported dizziness and lightheadedness resulting in a fall with head strike but no loss of consciousness.    Per GI progress note on 04/28/2024 by Nandigam MD interventions for acute colonic pseudo-obstruction limited by tenuous cardiac status without any current plans for repeat colonoscopy for decompression.    CT head on 04/21/24 negative for acute intracranial abnormality. CT abdomen on 11/28 demonstrated mild dilatation of colon with air-fluid levels with no transition point and distended loops and stomach. CT Angio Chest PE on 04/28/2024 negative for PE but increased bilateral GGO. TEE on 02/21/2024 showed EF 20-25%, LV severely decreased function and global hypokinesis, RV function moderately reduced.    Palliative medicine consulted for goals of care conversation.   SUMMARY OF RECOMMENDATIONS DNR-limited Full scope Continue current measures with goal of rehab and eventually home PMT will continue to follow for GOC  Symptom Management:  Per primary team  Code Status: DNR - Limited (DNR/DNI)  Prognosis: Unable to determine  Discharge Planning: To Be Determined  Discussed with: Lue MD about the patients continued progress and anticipation of rehab.   Subjective:   Subjective: Chart Reviewed. Updates received. Patient Assessed. Created space and opportunity for patient  and family to explore  thoughts and feelings regarding current medical situation.  Today's Discussion:  Met with patient at the bedside without any visitors. Patient was alert and reported feeling better every day. Discussed patient's progress with physical therapy with patient and concern for continued lightheadedness when sitting up at EOB and standing up and brief hypotension. Patient shares that he's doing better with them and remains motivated to pursue rehab at home or at a  facility, but that all depends on the primary team. Patient inquired about GI doctors saying that he hasn't seen them yesterday or today, shared with him that they signed off on 05/01/2024 with plans to continue current bowel regimen with continued improvement.   Review of Systems  Respiratory:         Dyspnea with exertion.   All other systems reviewed and are negative.   Objective:   Primary Diagnoses: Present on Admission:  Enterocolitis  Hypokalemia  Acute kidney injury  Elevated troponin  Hypotension  Chronic systolic CHF (congestive heart failure) (HCC)  Paroxysmal atrial flutter (HCC)  HLD (hyperlipidemia)  Anemia of chronic disease  Ogilvie syndrome  Ileus, unspecified (HCC)   Vital Signs:  BP 111/63 (BP Location: Left Arm)   Pulse 77   Temp 98.5 F (36.9 C) (Oral)   Resp 20   Ht 6' (1.829 m)   Wt 84.2 kg   SpO2 99%   BMI 25.18 kg/m   Physical Exam Constitutional:      Appearance: He is ill-appearing.  HENT:     Head: Normocephalic.  Eyes:     Extraocular Movements: Extraocular movements intact.  Cardiovascular:     Pulses: Normal pulses.  Pulmonary:     Effort: Pulmonary effort is normal.  Abdominal:     General: There is distension.     Palpations: Abdomen is soft.  Skin:    General: Skin is warm.  Neurological:     Mental Status: He is alert. Mental status is at baseline.     Comments: Short term memory deficits - baseline.     Palliative Assessment/Data: 40%   Existing Vynca/ACP Documentation: GOC documentation from 02/2024  Thank you for allowing us  to participate in the care of Chase Combs PMT will continue to support holistically.  I personally spent a total of 25 minutes in the care of the patient today including preparing to see the patient, performing a medically appropriate exam/evaluation, counseling and educating, referring and communicating with other health care professionals, and documenting clinical information in the  EHR.  Chase Combs Chase Combs  Palliative Medicine Team  Team Phone # [phone] (Nights/Weekends) 05/03/2024 10:39 AM

## 2024-05-03 NOTE — Progress Notes (Signed)
 Physical Therapy Treatment Patient Details Name: Chase Combs MRN: [id number] DOB: 1946-01-14 Today's Date: 05/03/2024   History of Present Illness Pt is a 78 y.o. male admitted 11/28 with enterocolitis, presyncope, and hypotension. Multiple recent hospitalizations for cardiac and GI issues.PMH: Ogilvie syndrome s/p colonic decompression, NSTEMI, CAD, heart failure, a fib, HTN, HLD, DMII, CKD, OSA, prostate ca    PT Comments  Pt resting in bed on arrival with [facility] elevated and eager for mobility. Pt with good carryover of education on importance of upright sitting. Pt with decreased dizziness in standing this session, able to maintain standing at EOB ~5 mins with pt performing standing marching x2 sets and taking steps along EOB to [facility] prior to needing to sit due to increasing symptoms. Pt BP stable throughout, see below in general comments. Pt requiring grossly min A for be mobility and transfers this session. Pt continues to benefit from skilled PT services to progress toward functional mobility goals.     If plan is discharge home, recommend the following: A lot of help with walking and/or transfers;A lot of help with bathing/dressing/bathroom;Assistance with cooking/housework;Assist for transportation;Help with stairs or ramp for entrance   Can travel by private vehicle     No  Equipment Recommendations  None recommended by PT    Recommendations for Other Services       Precautions / Restrictions Precautions Precautions: Fall;Other (comment) Recall of Precautions/Restrictions: Intact Precaution/Restrictions Comments: watch BP Restrictions Weight Bearing Restrictions Per Provider Order: No     Mobility  Bed Mobility Overal bed mobility: Needs Assistance Bed Mobility: Supine to Sit, Sit to Supine     Supine to sit: Contact guard Sit to supine: Min assist   General bed mobility comments: min a to manage LEs back to bed    Transfers Overall transfer level: Needs  assistance Equipment used: Rolling walker (2 wheels) Transfers: Sit to/from Stand Sit to Stand: Min assist           General transfer comment: light min A to stand, steady rise with good hand placement    Ambulation/Gait Ambulation/Gait assistance: Min assist Gait Distance (Feet): 5 Feet Assistive device: Rolling walker (2 wheels) Gait Pattern/deviations: Step-to pattern Gait velocity: dec   Pre-gait activities: standing marching x20 x2 sets General Gait Details: pt taking a few side steps along EOB to [facility]   Stairs             Wheelchair Mobility     Tilt Bed    Modified Rankin (Stroke Patients Only)       Balance Overall balance assessment: Needs assistance Sitting-balance support: Feet supported Sitting balance-Leahy Scale: Good Sitting balance - Comments: sits unsupported EOB   Standing balance support: Bilateral upper extremity supported Standing balance-Leahy Scale: Poor Standing balance comment: reliant on RW support                            Communication Communication Communication: No apparent difficulties Factors Affecting Communication: Hearing impaired  Cognition Arousal: Alert Behavior During Therapy: WFL for tasks assessed/performed, Anxious                             Following commands: Impaired Following commands impaired: Follows one step commands with increased time    Cueing Cueing Techniques: Verbal cues  Exercises Other Exercises Other Exercises: standing marching x20 x 2 sets    General Comments General comments (skin integrity,  edema, etc.): BP stable throughout positional changes; prior to session 98/57(70) standing 99/66(78), supine at end of session 116/67(82)      Pertinent Vitals/Pain Pain Assessment Pain Assessment: Faces Faces Pain Scale: Hurts a little bit Pain Location: gneralized Pain Descriptors / Indicators: Discomfort Pain Intervention(s): Monitored during session, Limited  activity within patient's tolerance    Home Living                          Prior Function            PT Goals (current goals can now be found in the care plan section) Acute Rehab PT Goals Patient Stated Goal: get stronger and return home PT Goal Formulation: With patient Time For Goal Achievement: 05/09/24 Progress towards PT goals: Progressing toward goals    Frequency    Min 2X/week      PT Plan      Co-evaluation              AM-PAC PT 6 Clicks Mobility   Outcome Measure  Help needed turning from your back to your side while in a flat bed without using bedrails?: A Lot Help needed moving from lying on your back to sitting on the side of a flat bed without using bedrails?: A Lot Help needed moving to and from a bed to a chair (including a wheelchair)?: A Little Help needed standing up from a chair using your arms (e.g., wheelchair or bedside chair)?: A Little Help needed to walk in hospital room?: Total Help needed climbing 3-5 steps with a railing? : Total 6 Click Score: 12    End of Session Equipment Utilized During Treatment: Oxygen Activity Tolerance: Patient limited by fatigue Patient left: in bed;with call bell/phone within reach Nurse Communication: Mobility status PT Visit Diagnosis: Other abnormalities of gait and mobility (R26.89);Muscle weakness (generalized) (M62.81)     Time: 8554-8540 PT Time Calculation (min) (ACUTE ONLY): 14 min  Charges:    $Therapeutic Activity: 8-22 mins PT General Charges $$ ACUTE PT VISIT: 1 Visit                     Brand Siever R. PTA Acute Rehabilitation Services Office: [phone]   [name] 05/03/2024, 3:15 PM

## 2024-05-03 NOTE — Progress Notes (Signed)
 PHARMACY - ANTICOAGULATION CONSULT NOTE  Pharmacy Consult for Heparin  (holding Apixaban ) Indication: atrial fibrillation  Allergies  Allergen Reactions   Codeine Nausea And Vomiting   Latex Rash    Oral blisters when used at dentist    Patient Measurements: Height: 6' (182.9 cm) Weight: 84.2 kg (185 lb 10 oz) IBW/kg (Calculated) : 77.6 HEPARIN  DW (KG): 85  Vital Signs: Temp: 97.7 F (36.5 C) (12/10 0343) Temp Source: Oral (12/10 0343) BP: 106/56 (12/10 0343) Pulse Rate: 74 (12/10 0343)  Labs: Recent Labs    04/30/24 1317 05/01/24 0355 05/01/24 0355 05/01/24 1558 05/02/24 0408 05/03/24 0314  HGB  --  10.8*   < >  --  11.5* 10.2*  HCT  --  32.6*  --   --  34.9* 31.8*  PLT  --  278  --   --  303 335  APTT 72* 46*  --   --   --   --   HEPARINUNFRC  --  0.26*  --   --  0.39 0.30  CREATININE  --  0.83   < > 0.99 0.83 0.81   < > = values in this interval not displayed.    Estimated Creatinine Clearance: 85.2 mL/min (by C-G formula based on SCr of 0.81 mg/dL).  Assessment: 78 y/o M on with increasingly distended abdomen > ileus, on apixaban  PTA for afib, holding apixaban  and starting heparin  in case any procedures are needed, last dose of apixaban  was 12/2 around 1930.   Heparin  level came back therapeutic but on the low side. We will increase slightly then check a level in AM.   Goal of Therapy:  Heparin  level 0.3-0.7 units/ml Monitor platelets by anticoagulation protocol: Yes   Plan:  Increase heparin  drip to 1900 units/hr Daily heparin  levels and CBC Monitor s/s bleeding   Sergio Batch, PharmD, BCIDP, AAHIVP, CPP Infectious Disease Pharmacist 05/03/2024 7:55 AM

## 2024-05-03 NOTE — Progress Notes (Signed)
 PROGRESS NOTE    Chase Combs  FMW:[id number] DOB: [birth year] DOA: 04/21/2024 PCP: Wonda Worth SQUIBB, PA   Brief Narrative:  78 y.o. 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 Of note, patient had recent complicated admission for septic shock, cardiogenic shock, intestinal pneumatosis/ileus/pseudoobstruction/Ogilvie syndrome. Now admitted for recurrent Ogilvie syndrome, suspected gastroenterocolitis, AKI on CKD, hypotension and acute debilitation.   Assessment & Plan:   Principal Problem:   Enterocolitis Active Problems:   Ogilvie syndrome   Chronic systolic CHF (congestive heart failure) (HCC)   Hypokalemia   HLD (hyperlipidemia)   Acute kidney injury   DM2 (diabetes mellitus, type 2) (HCC)   Postural dizziness with presyncope   Ileus, unspecified (HCC)   Elevated troponin   Fall at home, initial encounter   Hypotension   Paroxysmal atrial flutter (HCC)   Anemia of chronic disease   Adynamic ileus (HCC)   Chronic combined systolic and diastolic congestive heart failure (HCC)  Diffuse progressive ileus Recurrent Ogilvie Syndrome/pseudoobstruction History of recent hospitalization (01/22/24-02/25/24) for similar GI symptoms with extensive workup. Underwent colonic decompression (02/18/24) with improvement Currently afebrile, with no leukocytosis C diff and enteric PCR panel negative CT A/P w/o contrast with noted diffuse mild dilatation of colon, distended ileal loops and  stomach Repeat abdominal x-ray on 12/3 with diffuse ileus involving colon and small bowel GI consulted, appreciate recs, continue MiraLAX , rectal tube, high risk for complications with sedation for colonic decompression   Acute hypoxic respiratory failure, improving Currently requiring about 2-3 L of O2, wean as appropriate D-dimer mildly elevated Procalcitonin 0.26 Likely in the setting of abdominal distension/limited pulmonary excursion from underlying ileus/pseudoobstruction/gastroenterocolitis  CTA chest negative for PE PCCM consulted - completed 5 days of IV antibiotics, continue DuoNebs, nebulizers, Mucomyst    CHFrEF Ischemic cardiomyopathy Recent prolonged hospitalization complicated by cardiogenic shock with acute CHF exacerbation in the setting of LAD stent stenosis  Last echo 01/2024 with LVEF 20-25% CXR 12/2 showed bilateral opacities Cardiology consulted, appreciate recs - continue lasix , spironolactone , continue to hold entresto , digoxin , Jardiance    Persistent Afib/flutter with RVR Heart rate improved, now back in sinus rhythm as of 12/7 S/p TEE CV on 02/21/2024 Cardiology on board, appreciate recs - continue amiodarone , IV heparin  (Eliquis  held given above)   Gross hematuria Resolved Noted hematuria on 12/4 in the setting of anticoagulation Renal ultrasound showed enlarged prostate protruding into the inferior bladder, layering debris's in the bladder could reflect blood clot Urology consulted, placed foley on 12/5 Continue flomax    CAD s/p DES to LAD 8/25 Hyperlipidemia Recent balloon angioplasty of LAD stent thrombosis (02/01/2024) due to inability to tolerate oral DAPT  History of remote PCI to RI and RCA EKG with known RBBB, LAFB, no significant changes from prior Troponin 67 < 71 Cardiology consulted - cont home plavix , held Eliquis , continue Crestor    Hypokalemia/hypomagnesemia Likely in the setting of ileus, frequent diarrhea - replete as appropriate   AKI  on CKD stage II Cr 1.11 < 2.1, baseline 0.9-1.5 Likely in the setting of dehydration from diarrhea   Peptic ulcer disease Recent hospitalization with  EGD (02/02/24) showing esophageal ulcer without bleeding, grade C reflux esophagitis without bleeding, duodenal erosions Cont pantoprazole    T2DM Hold home Jardiance  Cont SSI   Incidental R thyroid  nodule Noted on CT C spine TSH normal  - recommend outpatient thyroid  US    Chronic normocytic anemia - stable   Acute ambulatory dysfunction Mechanical fall PT/OT following- rec SNF given profound weakness from baseline   GOC discussion Patient with poor prognosis, recurrent severe ileus, advanced CHF Palliative care consulted, appreciate recs  DVT prophylaxis: SCDs Start: 04/22/24 0137 Code Status:   Code Status: Limited: Do not attempt resuscitation (DNR) -DNR-LIMITED -Do Not Intubate/DNI  Family Communication: None present  Status is: Inpatient  Dispo: The patient is from: Home              Anticipated d/c is to: SNF              Anticipated d/c date is: To be determined              Patient currently not medically stable for discharge  Consultants:  Palliative care, cardiology, pulmonology, GI, urology   Subjective: No acute issues or events overnight denies headache fevers chills chest pain shortness of breath nausea vomiting  Objective: Vitals:   05/02/24 1730 05/02/24 1953 05/02/24 2237 05/03/24 0343  BP:  (!) 98/54 (!) 115/59 (!) 106/56  Pulse: 75 66 85 74  Resp: (!) 22 20 20 20   Temp:  97.8 F (36.6 C) 98.1 F (36.7 C) 97.7 F (36.5 C)  TempSrc:  Oral Oral Oral  SpO2: 99% 93% 98% 100%  Weight:    84.2 kg  Height:        Intake/Output Summary (Last 24 hours) at 05/03/2024 0803 Last data filed at 05/03/2024 0650 Gross per 24 hour  Intake 1690 ml  Output 1800 ml  Net -110 ml   Filed Weights   05/01/24 0500 05/02/24 0333 05/03/24 0343  Weight: 90.2 kg 84.4 kg 84.2 kg    Examination:  General:   Pleasantly resting in bed, No acute distress. HEENT:  Normocephalic atraumatic.  Sclerae nonicteric, noninjected.  Extraocular movements intact bilaterally. Neck:  Without mass or deformity.  Trachea is midline. Lungs:  Clear to auscultate bilaterally without rhonchi, wheeze, or rales. Heart:  Regular rate and rhythm.  Without murmurs, rubs, or gallops. Abdomen: Distended, nontympanic, rectal tube noted with liquid retained stool   Data Reviewed: I have personally reviewed following labs and imaging studies  CBC: Recent Labs  Lab 04/29/24 0334 04/30/24 0340 05/01/24 0355 05/02/24 0408 05/03/24 0314  WBC 6.0 6.3 5.4 5.8 4.7  NEUTROABS 5.6 5.0 3.9 4.3 3.2  HGB 10.9* 10.4* 10.8* 11.5* 10.2*  HCT 32.7* 31.9* 32.6* 34.9* 31.8*  MCV 90.1 90.6 90.3 92.1 91.6  PLT 290 276 278 303 335   Basic Metabolic Panel: Recent Labs  Lab 04/29/24 0334 04/29/24 1354 04/30/24 0340 04/30/24 0756 05/01/24 0354 05/01/24 0355 05/01/24 1558 05/02/24 0408 05/03/24 0314  NA 134*   < > 134*  --   --  138 137 137 134*  K 2.6*   < > 3.1*  --   --  2.2* 3.2* 2.9* 2.8*  CL 106   < > 106  --   --  101 103 102 101  CO2 19*   < > 22  --   --  26 31 19* 27  GLUCOSE 149*   < > 109*  --   --  95 115* 110* 87  BUN  33*   < > 22  --   --  18 16 14 11   CREATININE 0.95   < > 0.79  --   --  0.83 0.99 0.83 0.81  CALCIUM  7.9*   < > 7.6*  --   --  7.7* 7.8* 7.9* 8.0*  MG 2.0  --   --  1.7 1.5*  --   --  2.0  --    < > = values in this interval not displayed.   GFR: Estimated Creatinine Clearance: 85.2 mL/min (by C-G formula based on SCr of 0.81 mg/dL). Liver Function Tests: No results for input(s): AST, ALT, ALKPHOS, BILITOT, PROT, ALBUMIN in the last 168 hours. No results for input(s): LIPASE, AMYLASE in the last 168 hours. No results for input(s): AMMONIA in the last 168 hours. Coagulation Profile: No results for input(s): INR, PROTIME in the last 168 hours. Cardiac Enzymes: No results  for input(s): CKTOTAL, CKMB, CKMBINDEX, TROPONINI in the last 168 hours. BNP (last 3 results) Recent Labs    01/07/24 2301  PROBNP 15,245.0*   HbA1C: No results for input(s): HGBA1C in the last 72 hours. CBG: Recent Labs  Lab 05/02/24 1107 05/02/24 1608 05/02/24 1955 05/02/24 2338 05/03/24 0343  GLUCAP 98 117* 104* 100* 94   Lipid Profile: No results for input(s): CHOL, HDL, LDLCALC, TRIG, CHOLHDL, LDLDIRECT in the last 72 hours. Thyroid  Function Tests: No results for input(s): TSH, T4TOTAL, FREET4, T3FREE, THYROIDAB in the last 72 hours. Anemia Panel: No results for input(s): VITAMINB12, FOLATE, FERRITIN, TIBC, IRON, RETICCTPCT in the last 72 hours. Sepsis Labs: Recent Labs  Lab 04/27/24 1344  PROCALCITON 0.26    Recent Results (from the past 240 hours)  Respiratory (~20 pathogens) panel by PCR     Status: None   Collection Time: 04/28/24  6:06 PM   Specimen: Nasopharyngeal Swab; Respiratory  Result Value Ref Range Status   Adenovirus NOT DETECTED NOT DETECTED Final   Coronavirus 229E NOT DETECTED NOT DETECTED Final    Comment: (NOTE) The Coronavirus on the Respiratory Panel, DOES NOT test for the novel  Coronavirus (2019 nCoV)    Coronavirus HKU1 NOT DETECTED NOT DETECTED Final   Coronavirus NL63 NOT DETECTED NOT DETECTED Final   Coronavirus OC43 NOT DETECTED NOT DETECTED Final   Metapneumovirus NOT DETECTED NOT DETECTED Final   Rhinovirus / Enterovirus NOT DETECTED NOT DETECTED Final   Influenza A NOT DETECTED NOT DETECTED Final   Influenza B NOT DETECTED NOT DETECTED Final   Parainfluenza Virus 1 NOT DETECTED NOT DETECTED Final   Parainfluenza Virus 2 NOT DETECTED NOT DETECTED Final   Parainfluenza Virus 3 NOT DETECTED NOT DETECTED Final   Parainfluenza Virus 4 NOT DETECTED NOT DETECTED Final   Respiratory Syncytial Virus NOT DETECTED NOT DETECTED Final   Bordetella pertussis NOT DETECTED NOT DETECTED Final    Bordetella Parapertussis NOT DETECTED NOT DETECTED Final   Chlamydophila pneumoniae NOT DETECTED NOT DETECTED Final   Mycoplasma pneumoniae NOT DETECTED NOT DETECTED Final    Comment: Performed at [facility], [address] [place], [place] [address]  MRSA Next Gen by PCR, Nasal     Status: None   Collection Time: 04/29/24  4:31 PM   Specimen: Nasal Mucosa; Nasal Swab  Result Value Ref Range Status   MRSA by PCR Next Gen NOT DETECTED NOT DETECTED Final    Comment: (NOTE) The GeneXpert MRSA Assay (FDA approved for NASAL specimens only), is one component of a comprehensive MRSA colonization  surveillance program. It is not intended to diagnose MRSA infection nor to guide or monitor treatment for MRSA infections. Test performance is not FDA approved in patients less than 56 years old. Performed at [facility], [address], [place], [place] [address]          Radiology Studies: No results found.      Scheduled Meds:  acetylcysteine   2 mL Nebulization BID   amiodarone   200 mg Oral Daily   budesonide  (PULMICORT ) nebulizer solution  0.25 mg Nebulization BID   Chlorhexidine  Gluconate Cloth  6 each Topical Daily   clopidogrel   75 mg Oral Daily   feeding supplement  1 Container Oral TID BM   fiber supplement (BANATROL TF)  60 mL Oral BID   furosemide   40 mg Oral Daily   insulin  aspart  0-6 Units Subcutaneous Q4H   ipratropium  0.5 mg Nebulization BID   lidocaine   1 Application Urethral Once   pantoprazole   40 mg Oral Daily   polyethylene glycol  17 g Oral TID WC   potassium chloride   40 mEq Oral Once   revefenacin   175 mcg Nebulization Daily   rosuvastatin   20 mg Oral Daily   spironolactone   25 mg Oral Daily   tamsulosin   0.4 mg Oral QPC supper   Continuous Infusions:  heparin  1,850 Units/hr (05/03/24 0534)   potassium chloride        LOS: 11 days   Time spent:  Elsie JAYSON Montclair, DO [facility]  If 7PM-7AM, please contact  night-coverage [email or web address]  05/03/2024, 8:03 AM

## 2024-05-03 NOTE — Progress Notes (Signed)
 Progress Note  Patient Name: Chase Combs Date of Encounter: 05/03/2024 [facility] Cardiologist: Alm Clay, MD   Interval Summary   Doing well today. Feels like he is improving.   Vital Signs Vitals:   05/02/24 1953 05/02/24 2237 05/03/24 0343 05/03/24 0822  BP: (!) 98/54 (!) 115/59 (!) 106/56 111/63  Pulse: 66 85 74 77  Resp: 20 20 20 20   Temp: 97.8 F (36.6 C) 98.1 F (36.7 C) 97.7 F (36.5 C) 98.5 F (36.9 C)  TempSrc: Oral Oral Oral Oral  SpO2: 93% 98% 100% 99%  Weight:   84.2 kg   Height:        Intake/Output Summary (Last 24 hours) at 05/03/2024 0930 Last data filed at 05/03/2024 0650 Gross per 24 hour  Intake 1450 ml  Output 1800 ml  Net -350 ml      05/03/2024    3:43 AM 05/02/2024    3:33 AM 05/01/2024    5:00 AM  Last 3 Weights  Weight (lbs) 185 lb 10 oz 186 lb 1.1 oz 198 lb 13.7 oz  Weight (kg) 84.2 kg 84.4 kg 90.2 kg      Telemetry/ECG  Normal sinus rhythm with blocked PACs- Personally Reviewed  Physical Exam  Physical Exam Vitals and nursing note reviewed.  Constitutional:      Appearance: Normal appearance.  HENT:     Head: Normocephalic and atraumatic.  Eyes:     Conjunctiva/sclera: Conjunctivae normal.  Cardiovascular:     Rate and Rhythm: Normal rate and regular rhythm.  Pulmonary:     Effort: Pulmonary effort is normal.  Musculoskeletal:        General: No swelling.  Skin:    Coloration: Skin is not jaundiced or pale.  Neurological:     Mental Status: He is alert.     Assessment & Plan   Persistent A-Fib/flutter with RVR Recently diagnosed, s/p recent DCCV  Home meds: amiodarone  200 mg daily, digoxin  0.0625 mg daily, Eliquis  5 mg BID Currently in normal sinus rhythm with blocked PACs Started on IV amiodarone , converted to PO 12/8  Continue IV heparin  in setting of Eliquis  holiday, transition back prior to discharge  Continue PO amiodarone  200 mg daily  No digoxin  with electrolyte abnormalities    Coronary artery disease s/p DES to LAD 12/2023, prior PCI 2006, 2010 Hyperlipidemia  01/18/2024: HDL 37; LDL Cholesterol 49 04/23/2024: ALT 66  R/LHC 12/2023: severe single vessel CAD, stent to ostial/prox LAD, patent ramus intermedius stent, patent prox RCA stent  LHC 01/2024: ostially occluded stent with acute stent thrombosis in LAD, PTCA with balloon dilation, successful  Complex antiplatelet therapy history: on DAPT then discontinued due to GI bleed, then started on IV cangrelor , then transitioned to Plavix  followed by stent thrombosis few hours later Finally placed on Plavix  75 mg daily as genetic 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  Continue Crestor  20 mg daily Continue Plavix  75 mg daily and anticoagulation   Acute on Chronic HFrEF  Ischemic cardiomyopathy  Hypertension LVEF 20-25%, RV moderately reduced  Home meds: digoxin  0.0625 mg daily, PO Lasix  40 mg BID with 20 mEq K BID, Entresto  24-26 mg BID, spironolactone  12.5 mg daily, Jardiance  10 mg daily No beta-blocker with recent low output  No digoxin  with electrolyte abnormalities Appears euvolemic on exam  BP stable Renal function stable Weight stable and at 84 kg   Continue daily weights, daily BMPs, strict I&O's  Continue spironolactone  25 mg daily  Continue PO Lasix  40 mg daily, hold  IV Restart Entresto  prior to discharge if BP and renal function allows.    Per primary Diffuse progressive ileus, recurrent Ogilvie syndrome  Gross hematuria (resolved)  Hypokalemia Hypotension  AKI on CKD (resolved)  PUD  Type 2 DM Incidental thyroid  nodule  Chronic normocytic anemia  Acute debilitation, mechanical fall   No further recommendations at this time. Cardiology will sign off. Please do not hesitate to reach back out if any further questions.   Time coordinating care: 35 minutes  For questions or updates, please contact [facility] Please consult [email or web address] for contact info under   Signed, Emeline FORBES Calender,  DO

## 2024-05-04 DIAGNOSIS — Z515 Encounter for palliative care: Secondary | ICD-10-CM | POA: Diagnosis not present

## 2024-05-04 DIAGNOSIS — Z66 Do not resuscitate: Secondary | ICD-10-CM | POA: Diagnosis not present

## 2024-05-04 DIAGNOSIS — Z789 Other specified health status: Secondary | ICD-10-CM | POA: Diagnosis not present

## 2024-05-04 LAB — GLUCOSE, CAPILLARY
Glucose-Capillary: 123 mg/dL — ABNORMAL HIGH (ref 70–99)
Glucose-Capillary: 140 mg/dL — ABNORMAL HIGH (ref 70–99)
Glucose-Capillary: 108 mg/dL — ABNORMAL HIGH (ref 70–99)
Glucose-Capillary: 99 mg/dL (ref 70–99)
Glucose-Capillary: 125 mg/dL — ABNORMAL HIGH (ref 70–99)

## 2024-05-04 LAB — HEPARIN LEVEL (UNFRACTIONATED)
Heparin Unfractionated: 0.26 [IU]/mL — ABNORMAL LOW (ref 0.30–0.70)
Heparin Unfractionated: 0.27 [IU]/mL — ABNORMAL LOW (ref 0.30–0.70)

## 2024-05-04 NOTE — Progress Notes (Signed)
 PHARMACY - ANTICOAGULATION CONSULT NOTE  Pharmacy Consult for Heparin  (holding Apixaban ) Indication: atrial fibrillation  Allergies  Allergen Reactions   Codeine Nausea And Vomiting   Latex Rash    Oral blisters when used at dentist    Patient Measurements: Height: 6' (182.9 cm) Weight: 84.4 kg (186 lb 1.1 oz) IBW/kg (Calculated) : 77.6 HEPARIN  DW (KG): 85  Vital Signs: Temp: 98.1 F (36.7 C) (12/11 0235) Temp Source: Oral (12/11 0235) BP: 106/48 (12/11 0235) Pulse Rate: 78 (12/11 0235)  Labs: Recent Labs    05/01/24 1558 05/02/24 0408 05/03/24 0314 05/04/24 0335  HGB  --  11.5* 10.2*  --   HCT  --  34.9* 31.8*  --   PLT  --  303 335  --   HEPARINUNFRC  --  0.39 0.30 0.26*  CREATININE 0.99 0.83 0.81  --     Estimated Creatinine Clearance: 85.2 mL/min (by C-G formula based on SCr of 0.81 mg/dL).  Assessment: 78 y/o M on with increasingly distended abdomen > ileus, on apixaban  PTA for afib, holding apixaban  and starting heparin  in case any procedures are needed, last dose of apixaban  was 12/2 around 1930.   12/11: Heparin  level subtherapeutic with heparin  running at 1900 units/hour. No signs of bleeding or issues with the heparin  infusion noted.    Goal of Therapy:  Heparin  level 0.3-0.7 units/ml Monitor platelets by anticoagulation protocol: Yes   Plan:  Increase heparin  drip to 2,000 units/hr Heparin  level in 8 hours  Daily heparin  levels  CBC Q72H  Monitor s/s bleeding   Massie Fila, PharmD Clinical Pharmacist  05/04/2024 8:23 AM

## 2024-05-04 NOTE — Progress Notes (Signed)
° °  °  Subjective: NAEON. Catheter draining clear yellow urine.   Objective: Vital signs in last 24 hours: Temp:  [97.8 F (36.6 C)-98.5 F (36.9 C)] 98.1 F (36.7 C) (12/11 0235) Pulse Rate:  [68-78] 78 (12/11 0235) Resp:  [15-20] 20 (12/11 0235) BP: (96-111)/(48-63) 106/48 (12/11 0235) SpO2:  [93 %-99 %] 96 % (12/11 0235) Weight:  [84.4 kg] 84.4 kg (12/11 0235)  Assessment/Plan: 30m followed by Dr. Renda w/ BPH and hx of high risk pCa, treated with ADT 2/23. 95g prostate 2022  #BPH #Phimosis  Foley placed by Dr. Nieves 12/5 Continue Flomax  0.4mg  daily Pt could undergo voiding trial at any point. He has not been ambulating. Ideally he would at least be able to stand at side of the bed prior to catheter removal. If no realistic expectation of mobility in the near future, remove foley in the morning at discretion of primary team, and place purewick.    Intake/Output from previous day: 12/10 0701 - 12/11 0700 In: 1360 [P.O.:1360] Out: 1100 [Urine:950; Stool:150]  Intake/Output this shift: No intake/output data recorded.  Physical Exam:  General: Alert and oriented CV: No cyanosis Lungs: equal chest rise Abdomen: Soft, NTND, no rebound or guarding Gu: foley in place draining clear yellow urine  Lab Results: Recent Labs    05/02/24 0408 05/03/24 0314  HGB 11.5* 10.2*  HCT 34.9* 31.8*   BMET Recent Labs    05/02/24 0408 05/03/24 0314  NA 137 134*  K 2.9* 2.8*  CL 102 101  CO2 19* 27  GLUCOSE 110* 87  BUN 14 11  CREATININE 0.83 0.81  CALCIUM  7.9* 8.0*  HGB 11.5* 10.2*  WBC 5.8 4.7     Studies/Results: No results found.    LOS: 12 days   Chase Bourdon, NP [facility] Pager: [phone]  05/04/2024, 7:48 AM

## 2024-05-04 NOTE — Progress Notes (Signed)
 PROGRESS NOTE    Chase Combs  FMW:[id number] DOB: 1946-04-01 DOA: 04/21/2024 PCP: Wonda Worth SQUIBB, PA   Brief Narrative:  78 y.o. 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 Of note, patient had recent complicated admission for septic shock, cardiogenic shock, intestinal pneumatosis/ileus/pseudoobstruction/Ogilvie syndrome. Now admitted for recurrent Ogilvie syndrome, suspected gastroenterocolitis, AKI on CKD, hypotension and acute debilitation.   Assessment & Plan:   Principal Problem:   Enterocolitis Active Problems:   Ogilvie syndrome   Chronic systolic CHF (congestive heart failure) (HCC)   Hypokalemia   HLD (hyperlipidemia)   Acute kidney injury   DM2 (diabetes mellitus, type 2) (HCC)   Postural dizziness with presyncope   Ileus, unspecified (HCC)   Elevated troponin   Fall at home, initial encounter   Hypotension   Paroxysmal atrial flutter (HCC)   Anemia of chronic disease   Adynamic ileus (HCC)   Chronic combined systolic and diastolic congestive heart failure (HCC)  Diffuse progressive ileus Recurrent Ogilvie Syndrome/pseudoobstruction History of recent hospitalization (01/22/24-02/25/24) for similar GI symptoms with extensive workup. Underwent colonic decompression (02/18/24) with improvement Currently afebrile, with no leukocytosis C diff and enteric PCR panel negative CT A/P w/o contrast with noted diffuse mild dilatation of colon, distended ileal loops and  stomach Repeat abdominal x-ray on 12/3 with diffuse ileus involving colon and small bowel GI consulted, appreciate recs, continue MiraLAX , rectal tube, high risk for complications with sedation for colonic decompression   Acute hypoxic respiratory failure, improving Currently requiring about 2-3 L of O2, wean as appropriate D-dimer mildly elevated Procalcitonin 0.26 Likely in the setting of abdominal distension/limited pulmonary excursion from underlying ileus/pseudoobstruction/gastroenterocolitis  CTA chest negative for PE PCCM consulted - completed 5 days of IV antibiotics, continue DuoNebs, nebulizers, Mucomyst    CHFrEF Ischemic cardiomyopathy Recent prolonged hospitalization complicated by cardiogenic shock with acute CHF exacerbation in the setting of LAD stent stenosis  Last echo 01/2024 with LVEF 20-25% CXR 12/2 showed bilateral opacities Cardiology consulted, appreciate recs - continue lasix , spironolactone , continue to hold entresto , digoxin , Jardiance    Persistent Afib/flutter with RVR Heart rate improved, now back in sinus rhythm as of 12/7 S/p TEE CV on 02/21/2024 Cardiology on board, appreciate recs - continue amiodarone , IV heparin  (Eliquis  held given above)   Gross hematuria Resolved Noted hematuria on 12/4 in the setting of anticoagulation Renal ultrasound showed enlarged prostate protruding into the inferior bladder, layering debris's in the bladder could reflect blood clot Urology consulted, placed foley on 12/5 Continue flomax    CAD s/p DES to LAD 8/25 Hyperlipidemia Recent balloon angioplasty of LAD stent thrombosis (02/01/2024) due to inability to tolerate oral DAPT  History of remote PCI to RI and RCA EKG with known RBBB, LAFB, no significant changes from prior Troponin 67 < 71 Cardiology consulted - cont home plavix , held Eliquis , continue Crestor    Hypokalemia/hypomagnesemia Likely in the setting of ileus, frequent diarrhea - replete as appropriate   AKI  on CKD stage II Cr 1.11 < 2.1, baseline 0.9-1.5 Likely in the setting of dehydration from diarrhea   Peptic ulcer disease Recent hospitalization with  EGD (02/02/24) showing esophageal ulcer without bleeding, grade C reflux esophagitis without bleeding, duodenal erosions Cont pantoprazole    T2DM Hold home Jardiance  Cont SSI   Incidental R thyroid  nodule Noted on CT C spine TSH normal  - recommend outpatient thyroid  US    Chronic normocytic anemia - stable   Acute ambulatory dysfunction Mechanical fall PT/OT following- rec SNF given profound weakness from baseline   GOC discussion Patient with poor prognosis, recurrent severe ileus, advanced CHF Palliative care consulted, appreciate recs  DVT prophylaxis: SCDs Start: 04/22/24 0137 Code Status:   Code Status: Limited: Do not attempt resuscitation (DNR) -DNR-LIMITED -Do Not Intubate/DNI  Family Communication: None present  Status is: Inpatient  Dispo: The patient is from: Home              Anticipated d/c is to: SNF              Anticipated d/c date is: To be determined              Patient currently not medically stable for discharge  Consultants:  Palliative care, cardiology, pulmonology, GI, urology   Subjective: No acute issues or events overnight denies headache fevers chills chest pain shortness of breath nausea vomiting  Objective: Vitals:   05/03/24 2003 05/03/24 2355 05/04/24 0235 05/04/24 0754  BP: (!) 106/59 (!) 103/57 (!) 106/48 (!) 102/52  Pulse: 77 70 78 69  Resp: 20 18 20 19   Temp: 97.8 F (36.6 C) 98.3 F (36.8 C) 98.1 F (36.7 C) 98.2 F (36.8 C)  TempSrc: Oral Oral Oral Oral  SpO2: 94% 93% 96% 93%  Weight:   84.4 kg   Height:        Intake/Output Summary (Last 24 hours) at 05/04/2024 0821 Last data filed at 05/04/2024 0700 Gross per 24 hour  Intake 1360 ml  Output 1100 ml  Net 260 ml   Filed Weights   05/02/24 0333 05/03/24 0343 05/04/24 0235  Weight: 84.4 kg 84.2 kg 84.4 kg     Examination:  General:  Pleasantly resting in bed, No acute distress. HEENT:  Normocephalic atraumatic.  Sclerae nonicteric, noninjected.  Extraocular movements intact bilaterally. Neck:  Without mass or deformity.  Trachea is midline. Lungs:  Clear to auscultate bilaterally without rhonchi, wheeze, or rales. Heart:  Regular rate and rhythm.  Without murmurs, rubs, or gallops. Abdomen: Distended, nontympanic, rectal tube noted with liquid retained stool   Data Reviewed: I have personally reviewed following labs and imaging studies  CBC: Recent Labs  Lab 04/29/24 0334 04/30/24 0340 05/01/24 0355 05/02/24 0408 05/03/24 0314  WBC 6.0 6.3 5.4 5.8 4.7  NEUTROABS 5.6 5.0 3.9 4.3 3.2  HGB 10.9* 10.4* 10.8* 11.5* 10.2*  HCT 32.7* 31.9* 32.6* 34.9* 31.8*  MCV 90.1 90.6 90.3 92.1 91.6  PLT 290 276 278 303 335   Basic Metabolic Panel: Recent Labs  Lab 04/29/24 0334 04/29/24 1354 04/30/24 0340 04/30/24 0756 05/01/24 0354 05/01/24 0355 05/01/24 1558 05/02/24 0408 05/03/24 0314  NA 134*   < > 134*  --   --  138 137 137 134*  K 2.6*   < > 3.1*  --   --  2.2* 3.2* 2.9* 2.8*  CL 106   < > 106  --   --  101 103 102 101  CO2 19*   < > 22  --   --  26 31 19* 27  GLUCOSE 149*   < > 109*  --   --  95 115* 110*  87  BUN 33*   < > 22  --   --  18 16 14 11   CREATININE 0.95   < > 0.79  --   --  0.83 0.99 0.83 0.81  CALCIUM  7.9*   < > 7.6*  --   --  7.7* 7.8* 7.9* 8.0*  MG 2.0  --   --  1.7 1.5*  --   --  2.0  --    < > = values in this interval not displayed.   GFR: Estimated Creatinine Clearance: 85.2 mL/min (by C-G formula based on SCr of 0.81 mg/dL). Liver Function Tests: No results for input(s): AST, ALT, ALKPHOS, BILITOT, PROT, ALBUMIN in the last 168 hours. No results for input(s): LIPASE, AMYLASE in the last 168 hours. No results for input(s): AMMONIA in the last 168 hours. Coagulation Profile: No results for input(s): INR, PROTIME in the last 168  hours. Cardiac Enzymes: No results for input(s): CKTOTAL, CKMB, CKMBINDEX, TROPONINI in the last 168 hours. BNP (last 3 results) Recent Labs    01/07/24 2301  PROBNP 15,245.0*   HbA1C: No results for input(s): HGBA1C in the last 72 hours. CBG: Recent Labs  Lab 05/03/24 1709 05/03/24 2004 05/03/24 2354 05/04/24 0305 05/04/24 0752  GLUCAP 93 103* 110* 123* 140*   Lipid Profile: No results for input(s): CHOL, HDL, LDLCALC, TRIG, CHOLHDL, LDLDIRECT in the last 72 hours. Thyroid  Function Tests: No results for input(s): TSH, T4TOTAL, FREET4, T3FREE, THYROIDAB in the last 72 hours. Anemia Panel: No results for input(s): VITAMINB12, FOLATE, FERRITIN, TIBC, IRON, RETICCTPCT in the last 72 hours. Sepsis Labs: Recent Labs  Lab 04/27/24 1344  PROCALCITON 0.26    Recent Results (from the past 240 hours)  Respiratory (~20 pathogens) panel by PCR     Status: None   Collection Time: 04/28/24  6:06 PM   Specimen: Nasopharyngeal Swab; Respiratory  Result Value Ref Range Status   Adenovirus NOT DETECTED NOT DETECTED Final   Coronavirus 229E NOT DETECTED NOT DETECTED Final    Comment: (NOTE) The Coronavirus on the Respiratory Panel, DOES NOT test for the novel  Coronavirus (2019 nCoV)    Coronavirus HKU1 NOT DETECTED NOT DETECTED Final   Coronavirus NL63 NOT DETECTED NOT DETECTED Final   Coronavirus OC43 NOT DETECTED NOT DETECTED Final   Metapneumovirus NOT DETECTED NOT DETECTED Final   Rhinovirus / Enterovirus NOT DETECTED NOT DETECTED Final   Influenza A NOT DETECTED NOT DETECTED Final   Influenza B NOT DETECTED NOT DETECTED Final   Parainfluenza Virus 1 NOT DETECTED NOT DETECTED Final   Parainfluenza Virus 2 NOT DETECTED NOT DETECTED Final   Parainfluenza Virus 3 NOT DETECTED NOT DETECTED Final   Parainfluenza Virus 4 NOT DETECTED NOT DETECTED Final   Respiratory Syncytial Virus NOT DETECTED NOT DETECTED Final   Bordetella pertussis  NOT DETECTED NOT DETECTED Final   Bordetella Parapertussis NOT DETECTED NOT DETECTED Final   Chlamydophila pneumoniae NOT DETECTED NOT DETECTED Final   Mycoplasma pneumoniae NOT DETECTED NOT DETECTED Final    Comment: Performed at [facility], [address] [place], [place] [address]  MRSA Next Gen by PCR, Nasal     Status: None   Collection Time: 04/29/24  4:31 PM   Specimen: Nasal Mucosa; Nasal Swab  Result Value Ref Range Status   MRSA by PCR Next Gen NOT DETECTED NOT DETECTED Final    Comment: (NOTE) The GeneXpert MRSA Assay (FDA approved for NASAL specimens only), is one component of a  comprehensive MRSA colonization surveillance program. It is not intended to diagnose MRSA infection nor to guide or monitor treatment for MRSA infections. Test performance is not FDA approved in patients less than 67 years old. Performed at [facility], [address] [place], [place] [address]          Radiology Studies: No results found.      Scheduled Meds:  acetylcysteine   2 mL Nebulization BID   amiodarone   200 mg Oral Daily   budesonide  (PULMICORT ) nebulizer solution  0.25 mg Nebulization BID   Chlorhexidine  Gluconate Cloth  6 each Topical Daily   clopidogrel   75 mg Oral Daily   feeding supplement  1 Container Oral TID BM   fiber supplement (BANATROL TF)  60 mL Oral BID   furosemide   40 mg Oral Daily   insulin  aspart  0-6 Units Subcutaneous Q4H   ipratropium  0.5 mg Nebulization BID   lidocaine   1 Application Urethral Once   pantoprazole   40 mg Oral Daily   polyethylene glycol  17 g Oral TID WC   revefenacin   175 mcg Nebulization Daily   rosuvastatin   20 mg Oral Daily   spironolactone   25 mg Oral Daily   tamsulosin   0.4 mg Oral QPC supper   Continuous Infusions:  heparin  1,900 Units/hr (05/03/24 1943)     LOS: 12 days   Time spent:  Elsie JAYSON [place], DO [facility]  If 7PM-7AM, please contact  night-coverage [email or web address]  05/04/2024, 8:21 AM

## 2024-05-04 NOTE — Progress Notes (Signed)
 Daily Progress Note   Date: 05/04/2024   Patient Name: Chase Combs  DOB: 06/19/1945  MRN: [id number]  Age / Sex: 78 y.o., male  Attending Physician: Lue Elsie BROCKS, MD Primary Care Physician: Wonda Worth SQUIBB, PA Admit Date: 04/21/2024 Length of Stay: 12 days  Reason for Follow-up: Establishing goals of care  Past Medical History:  Diagnosis Date   Abdominal hernia    per pt   Arthritis    hands   Benign localized prostatic hyperplasia with lower urinary tract symptoms (LUTS)    CAD (coronary artery disease) 05/2004   cardiologist--- dr anner;  positive stress test , had outpt cardiac cath 05-27-2004 stenosis RI;  06-18-2004 cath w/ PCI and BMS x1 to pRI;   STEMI 02-16-2009  s/p cath w/ PCI thrombectomy for total occluded RCA , BMS x1 to pRCA;  nuclear stress test 03-15-2012 low risk no ischemia but sig inferior-inferolateral infarct , ef 51%   Dyslipidemia    Essential hypertension    Heart murmur    History of COVID-19 05/2020   per pt mild symptoms that resolved   History of ST elevation myocardial infarction (STEMI) 02/16/2009   inferior STEMI   cath w/ pci w/ BMS to pRCA   History of urinary retention    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   urologist--- dr borden/ radiation oncology-- dr patrcia;  dx 12/ 2022,  Gleason 4+5, PSA 24.6, volume 94.6cc;  plan to start IMRT   OSA (obstructive sleep apnea)    per pt dx approx 2008, no cpap intolerant   PONV (postoperative nausea and vomiting)    Pre-diabetes    S/p bare metal coronary artery stent    01/ 2006  x1 BMS to pRI (CoStar study stent);  and 09/ 2010  x1 BMS to pRCA   Status post total knee replacement, 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    Per H&P on 04/22/2024 by [facility] MD, patient presented after a couple days of N/V/D with decreased PO intake. Patient reported dizziness and lightheadedness resulting in a fall with head strike but no loss of consciousness.    Per GI progress note on 04/28/2024 by Nandigam MD interventions for acute colonic pseudo-obstruction limited by tenuous cardiac status without any current plans for repeat colonoscopy for decompression.    CT head on 04/21/24 negative for acute intracranial abnormality. CT abdomen on 11/28 demonstrated mild dilatation of colon with air-fluid levels with no transition point and distended loops and stomach. CT Angio Chest PE on 04/28/2024 negative for PE but increased bilateral GGO. TEE on 02/21/2024 showed EF 20-25%, LV severely decreased function and global hypokinesis, RV function moderately reduced.    Palliative medicine consulted for goals of care conversation.   SUMMARY OF RECOMMENDATIONS  DNR-limited Full scope Continue current measures with goal of rehab PMT will continue to follow and monitor for outcomes  Symptom Management:  Per primary team  Code Status: DNR - Limited (DNR/DNI)  Prognosis: Unable to determine  Discharge Planning: To Be Determined  Discussed with: Lue MD 05/04/2024 about continued desire for progress towards rehab.   Subjective:   Subjective: Chart Reviewed. Updates received. Patient Assessed. Created space and opportunity for patient  and family to explore  thoughts and feelings regarding current medical situation.  Today's Discussion:  Met with patient at the bedside without any visitors. Patient shares that he has once again had a bowel movement and awaiting to be cleaned. Overall shares that he continues to feel better every day and with the same goal of getting to rehab and eventually home. No other complaints or discomfort at this time.  Review of Systems  Gastrointestinal:  Positive for abdominal distention.     Objective:   Primary Diagnoses: Present on Admission:  Enterocolitis  Hypokalemia  Acute kidney injury  Elevated troponin  Hypotension  Chronic systolic CHF (congestive heart failure) (HCC)  Paroxysmal atrial flutter (HCC)  HLD (hyperlipidemia)  Anemia of chronic disease  Ogilvie syndrome  Ileus, unspecified (HCC)   Vital Signs:  BP (!) 102/52 (BP Location: Left Arm)   Pulse 69   Temp 98.2 F (36.8 C) (Oral)   Resp 19   Ht 6' (1.829 m)   Wt 84.4 kg   SpO2 93%   BMI 25.24 kg/m   Physical Exam Constitutional:      Appearance: He is ill-appearing.  HENT:     Head: Normocephalic.     Mouth/Throat:     Mouth: Mucous membranes are dry.  Eyes:     Extraocular Movements: Extraocular movements intact.  Cardiovascular:     Rate and Rhythm: Normal rate.  Pulmonary:     Effort: Pulmonary effort is normal.  Abdominal:     Palpations: Abdomen is soft.  Skin:    General: Skin is warm.  Neurological:     General: No focal deficit present.     Mental Status: He is alert.  Psychiatric:        Mood and Affect: Mood normal.    Palliative Assessment/Data: 60%   Existing Vynca/ACP Documentation: None  Thank you for allowing us  to participate in the care of CASS VANDERMEULEN PMT will continue to support holistically.  I personally spent a total of 25 minutes in the care of the patient today including preparing to see the patient, performing a medically appropriate exam/evaluation, referring and communicating with other health care professionals, and documenting clinical information in the EHR.  [name]  Palliative Medicine Team  Team Phone # [phone] (Nights/Weekends) 05/04/2024 10:05 AM

## 2024-05-04 NOTE — Progress Notes (Signed)
 Occupational Therapy Treatment Patient Details Name: Chase Combs MRN: [id number] DOB: 15-Jun-1945 Today's Date: 05/04/2024   History of present illness Pt is a 78 y.o. male admitted 11/28 with enterocolitis, presyncope, and hypotension. Multiple recent hospitalizations for cardiac and GI issues.PMH: Ogilvie syndrome s/p colonic decompression, NSTEMI, CAD, heart failure, a fib, HTN, HLD, DMII, CKD, OSA, prostate ca   OT comments  Pt making progress with functional goals. Pt in be upon arrival with wife present. Pt able to roll to left and right side using rail with Sup, CGA to sit EOB to participate in UB CGA and LB ADLs tasks  mod A, grooming/hygiene. Pt returned to supine with CGA and instructed on long sitting with good carryover of education on importance of upright sitting. Pt states that he is concerned about rectal tube leaking as it did earlier today, but that he would like to get OOB with next therapy session later in afternoon. Pt with R shoulder impairments in FF  with ~60-75 degrees AROM from previous shoulder injury per pt. OT will continue to follow acutely to maximize level of function and safety      If plan is discharge home, recommend the following:  A lot of help with bathing/dressing/bathroom;Assistance with cooking/housework;Direct supervision/assist for medications management;Direct supervision/assist for financial management;Help with stairs or ramp for entrance;Assist for transportation;A lot of help with walking and/or transfers   Equipment Recommendations  Other (comment) (defer)    Recommendations for Other Services      Precautions / Restrictions Precautions Precautions: Fall;Other (comment) Recall of Precautions/Restrictions: Intact Precaution/Restrictions Comments: watch BP Restrictions Weight Bearing Restrictions Per Provider Order: No       Mobility Bed Mobility Overal bed mobility: Needs Assistance       Supine to sit: Contact guard Sit to  supine: Contact guard assist        Transfers                   General transfer comment: pt declined, is concerned about rectal tube leaking     Balance Overall balance assessment: Needs assistance Sitting-balance support: Feet supported Sitting balance-Leahy Scale: Good Sitting balance - Comments: sits unsupported EOB                                   ADL either performed or assessed with clinical judgement   ADL Overall ADL's : Needs assistance/impaired     Grooming: Wash/dry hands;Wash/dry face;Supervision/safety   Upper Body Bathing: Contact guard assist;Sitting Upper Body Bathing Details (indicate cue type and reason): simulated Lower Body Bathing: Moderate assistance;Sitting/lateral leans Lower Body Bathing Details (indicate cue type and reason): simulated Upper Body Dressing : Contact guard assist;Sitting                     General ADL Comments: Pt agreeable to long sititng in in bed and sitting EOB for functional tasks    Extremity/Trunk Assessment Upper Extremity Assessment Upper Extremity Assessment: LUE deficits/detail   Lower Extremity Assessment Lower Extremity Assessment: Defer to PT evaluation        Vision Ability to See in Adequate Light: 0 Adequate Patient Visual Report: No change from baseline     Perception     Praxis     Communication Communication Communication: No apparent difficulties Factors Affecting Communication: Hearing impaired   Cognition Arousal: Alert Behavior During Therapy: WFL for tasks assessed/performed, Anxious  Following commands: Impaired        Cueing   Cueing Techniques: Verbal cues  Exercises      Shoulder Instructions       General Comments      Pertinent Vitals/ Pain       Pain Assessment Pain Assessment: Faces Faces Pain Scale: Hurts a little bit Pain Location: gneralized Pain Descriptors / Indicators: Discomfort,  Grimacing Pain Intervention(s): Monitored during session, Repositioned  Home Living                                          Prior Functioning/Environment              Frequency  Min 2X/week        Progress Toward Goals  OT Goals(current goals can now be found in the care plan section)  Progress towards OT goals: Progressing toward goals     Plan      Co-evaluation                 AM-PAC OT 6 Clicks Daily Activity     Outcome Measure   Help from another person eating meals?: None Help from another person taking care of personal grooming?: A Little Help from another person toileting, which includes using toliet, bedpan, or urinal?: A Lot Help from another person bathing (including washing, rinsing, drying)?: A Lot Help from another person to put on and taking off regular upper body clothing?: A Little Help from another person to put on and taking off regular lower body clothing?: A Lot 6 Click Score: 16    End of Session    OT Visit Diagnosis: Other abnormalities of gait and mobility (R26.89);Muscle weakness (generalized) (M62.81)   Activity Tolerance Patient tolerated treatment well   Patient Left in bed;with call bell/phone within reach;with bed alarm set;with family/visitor present   Nurse Communication Mobility status        Time: 8690-8663 OT Time Calculation (min): 27 min  Charges: OT General Charges $OT Visit: 1 Visit OT Treatments $Self Care/Home Management : 8-22 mins $Therapeutic Activity: 8-22 mins   Jacques Karna Loose 05/04/2024, 2:02 PM

## 2024-05-05 LAB — GLUCOSE, CAPILLARY
Glucose-Capillary: 105 mg/dL — ABNORMAL HIGH (ref 70–99)
Glucose-Capillary: 106 mg/dL — ABNORMAL HIGH (ref 70–99)
Glucose-Capillary: 109 mg/dL — ABNORMAL HIGH (ref 70–99)
Glucose-Capillary: 111 mg/dL — ABNORMAL HIGH (ref 70–99)
Glucose-Capillary: 113 mg/dL — ABNORMAL HIGH (ref 70–99)
Glucose-Capillary: 115 mg/dL — ABNORMAL HIGH (ref 70–99)
Glucose-Capillary: 142 mg/dL — ABNORMAL HIGH (ref 70–99)

## 2024-05-05 LAB — HEPARIN LEVEL (UNFRACTIONATED): Heparin Unfractionated: 0.37 [IU]/mL (ref 0.30–0.70)

## 2024-05-05 MED ORDER — GERHARDT'S BUTT CREAM
TOPICAL_CREAM | Freq: Two times a day (BID) | CUTANEOUS | Status: DC
  Filled 2024-05-05 (×3): qty 60

## 2024-05-05 MED ORDER — POLYETHYLENE GLYCOL 3350 17 G PO PACK
17.0000 g | PACK | Freq: Every day | ORAL | Status: DC
  Administered 2024-05-11: 09:00:00 17 g via ORAL
  Filled 2024-05-05 (×4): qty 1

## 2024-05-05 NOTE — Progress Notes (Signed)
 Physical Therapy Treatment Patient Details Name: Chase Combs MRN: [id number] DOB: 04-06-1946 Today's Date: 05/05/2024   History of Present Illness Pt is a 78 y.o. male admitted 11/28 with enterocolitis, presyncope, and hypotension. Multiple recent hospitalizations for cardiac and GI issues.PMH: Ogilvie syndrome s/p colonic decompression, NSTEMI, CAD, heart failure, a fib, HTN, HLD, DMII, CKD, OSA, prostate ca    PT Comments  Pt resting in bed on arrival, eager for mobility and demonstrating steady progress towards acute goals. Pt requiring light min A to come to sitting EOB. Pt performing x5 serial sit<>stands x2 sets with seated rest between sets for increased LE strength. Pt with emesis after initial set of sit<>stands, however pt wanting to continue with session. Pt taking a few steps along EOB to [facility], however declining gait away from EOB due to rectal tube leaking. Continued to encourage frequent mobilization with pt verbalizing understanding. Pt continues to benefit from skilled PT services to progress toward functional mobility goals.     If plan is discharge home, recommend the following: A lot of help with walking and/or transfers;A lot of help with bathing/dressing/bathroom;Assistance with cooking/housework;Assist for transportation;Help with stairs or ramp for entrance   Can travel by private vehicle     No  Equipment Recommendations  None recommended by PT    Recommendations for Other Services       Precautions / Restrictions Precautions Precautions: Fall;Other (comment) Recall of Precautions/Restrictions: Intact Precaution/Restrictions Comments: watch BP Restrictions Weight Bearing Restrictions Per Provider Order: No     Mobility  Bed Mobility Overal bed mobility: Needs Assistance Bed Mobility: Supine to Sit, Sit to Supine     Supine to sit: Min assist Sit to supine: Contact guard assist   General bed mobility comments: light min A to elevate trunk with pt  requesting HHA    Transfers Overall transfer level: Needs assistance Equipment used: Rolling walker (2 wheels) Transfers: Sit to/from Stand Sit to Stand: Min assist, Contact guard assist           General transfer comment: pt completing x5 serial sit<>stands x2 sets. pt with emesis after first set, however motivated to complete more mobiltiy.    Ambulation/Gait Ambulation/Gait assistance: Contact guard assist Gait Distance (Feet): 2 Feet Assistive device: Rolling walker (2 wheels) Gait Pattern/deviations: Step-to pattern Gait velocity: dec     General Gait Details: pt taking a few side steps along EOB to HOB, declining gait away from EOB due to leaking rectal tube   Stairs             Wheelchair Mobility     Tilt Bed    Modified Rankin (Stroke Patients Only)       Balance Overall balance assessment: Needs assistance Sitting-balance support: Feet supported Sitting balance-Leahy Scale: Good Sitting balance - Comments: sits unsupported EOB   Standing balance support: Bilateral upper extremity supported Standing balance-Leahy Scale: Fair Standing balance comment: able to maintain static standing without UE support                            Communication Communication Communication: No apparent difficulties Factors Affecting Communication: Hearing impaired  Cognition Arousal: Alert Behavior During Therapy: WFL for tasks assessed/performed, Anxious                             Following commands: Impaired Following commands impaired: Follows one step commands with increased time  Cueing Cueing Techniques: Verbal cues  Exercises Other Exercises Other Exercises: serial sit<>stands x5 , x2 sets    General Comments General comments (skin integrity, edema, etc.): pt without c/o dizziness, nauseous with sit<>stands with emesis      Pertinent Vitals/Pain Pain Assessment Pain Assessment: Faces Faces Pain Scale: Hurts a little  bit Pain Location: gneralized Pain Descriptors / Indicators: Discomfort, Grimacing Pain Intervention(s): Monitored during session, Limited activity within patient's tolerance    Home Living                          Prior Function            PT Goals (current goals can now be found in the care plan section) Acute Rehab PT Goals Patient Stated Goal: get stronger and return home PT Goal Formulation: With patient Time For Goal Achievement: 05/09/24 Progress towards PT goals: Progressing toward goals    Frequency    Min 2X/week      PT Plan      Co-evaluation              AM-PAC PT 6 Clicks Mobility   Outcome Measure  Help needed turning from your back to your side while in a flat bed without using bedrails?: A Lot Help needed moving from lying on your back to sitting on the side of a flat bed without using bedrails?: A Lot Help needed moving to and from a bed to a chair (including a wheelchair)?: A Little Help needed standing up from a chair using your arms (e.g., wheelchair or bedside chair)?: A Little Help needed to walk in hospital room?: Total Help needed climbing 3-5 steps with a railing? : Total 6 Click Score: 12    End of Session Equipment Utilized During Treatment: Oxygen Activity Tolerance: Patient limited by fatigue Patient left: in bed;with call bell/phone within reach;with family/visitor present Nurse Communication: Mobility status PT Visit Diagnosis: Other abnormalities of gait and mobility (R26.89);Muscle weakness (generalized) (M62.81)     Time: 8560-8543 PT Time Calculation (min) (ACUTE ONLY): 17 min  Charges:    $Therapeutic Exercise: 8-22 mins PT General Charges $$ ACUTE PT VISIT: 1 Visit                     Chase Combs R. PTA Acute Rehabilitation Services Office: [phone]   [name] 05/05/2024, 3:06 PM

## 2024-05-05 NOTE — Progress Notes (Addendum)
 PROGRESS NOTE    Chase Combs  FMW:[id number] DOB: Nov 23, 1945 DOA: 04/21/2024 PCP: Wonda Worth SQUIBB, PA   Brief Narrative:  78 y.o. 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 Of note, patient had recent complicated admission for septic shock, cardiogenic shock, intestinal pneumatosis/ileus/pseudoobstruction/Ogilvie syndrome. Now admitted for recurrent Ogilvie syndrome, suspected gastroenterocolitis, AKI on CKD, hypotension and acute debilitation.   Assessment & Plan:   Principal Problem:   Enterocolitis Active Problems:   Ogilvie syndrome   Chronic systolic CHF (congestive heart failure) (HCC)   Hypokalemia   HLD (hyperlipidemia)   Acute kidney injury   DM2 (diabetes mellitus, type 2) (HCC)   Postural dizziness with presyncope   Ileus, unspecified (HCC)   Elevated troponin   Fall at home, initial encounter   Hypotension   Paroxysmal atrial flutter (HCC)   Anemia of chronic disease   Adynamic ileus (HCC)   Chronic combined systolic and diastolic congestive heart failure (HCC)  Diffuse progressive ileus Recurrent Ogilvie Syndrome/pseudoobstruction Severe protein caloric malnutrition Profound diffuse diarrhea History of recent hospitalization (01/22/24-02/25/24) for similar GI symptoms with extensive workup. Underwent colonic decompression (02/18/24) with improvement Currently afebrile, with no leukocytosis C diff and enteric PCR panel negative CT A/P w/o contrast with noted  diffuse mild dilatation of colon, distended ileal loops and stomach Repeat abdominal x-ray on 12/3 with diffuse ileus involving colon and small bowel GI consulted, appreciate recs, continue MiraLAX , rectal tube, high risk for complications with sedation for colonic decompression 24 hours of profound diffuse diarrhea with hypokalemia, MiraLAX  decreased from 3 times daily to daily -patient continues to require rectal tube for management of stool given its frequency and volume   Acute hypoxic respiratory failure, improving Currently requiring about 2-3 L of O2, wean as appropriate D-dimer mildly elevated Procalcitonin 0.26 Likely in the setting of abdominal distension/limited pulmonary excursion from underlying ileus/pseudoobstruction/gastroenterocolitis  CTA chest negative for PE PCCM consulted - completed 5 days of IV antibiotics, continue DuoNebs, nebulizers, Mucomyst    CHFrEF Ischemic cardiomyopathy Recent prolonged hospitalization complicated by cardiogenic shock with acute CHF exacerbation in the setting of LAD stent stenosis  Last echo 01/2024 with LVEF 20-25% CXR 12/2 showed bilateral opacities Cardiology consulted, appreciate recs - continue lasix , spironolactone , continue to hold entresto , digoxin , Jardiance    Persistent Afib/flutter with RVR Heart rate improved, now back in sinus rhythm as of 12/7 S/p TEE CV on 02/21/2024 Cardiology on board, appreciate recs - continue amiodarone , IV heparin  (Eliquis  held given above)   Gross hematuria Resolved Noted hematuria on 12/4 in the setting of anticoagulation Renal ultrasound showed enlarged prostate protruding into the inferior bladder, layering debris's in the bladder could reflect blood clot Urology consulted, placed foley on 12/5 Continue flomax    CAD s/p DES to LAD 8/25 Hyperlipidemia Recent balloon angioplasty of LAD stent thrombosis (02/01/2024) due to inability to tolerate oral DAPT  History of remote PCI to RI and RCA EKG  with known RBBB, LAFB, no significant changes from prior Troponin 67 < 71 Cardiology consulted - cont home plavix , held Eliquis , continue Crestor    Hypokalemia/hypomagnesemia Likely in the  setting of ileus, frequent diarrhea - replete as appropriate   AKI on CKD stage II Cr 1.11 < 2.1, baseline 0.9-1.5 Likely in the setting of dehydration from diarrhea   Peptic ulcer disease Recent hospitalization with EGD (02/02/24) showing esophageal ulcer without bleeding, grade C reflux esophagitis without bleeding, duodenal erosions Cont pantoprazole    T2DM Hold home Jardiance  Cont SSI   Incidental R thyroid  nodule Noted on CT C spine TSH normal  - recommend outpatient thyroid  US    Chronic normocytic anemia - stable   Acute ambulatory dysfunction Mechanical fall PT/OT following- rec SNF given profound weakness from baseline   GOC discussion Patient with poor prognosis, recurrent severe ileus, advanced CHF Palliative care consulted, appreciate recs  DVT prophylaxis: SCDs Start: 04/22/24 0137 Code Status:   Code Status: Limited: Do not attempt resuscitation (DNR) -DNR-LIMITED -Do Not Intubate/DNI  Family Communication: None present  Status is: Inpatient  Dispo: The patient is from: Home              Anticipated d/c is to: SNF              Anticipated d/c date is: To be determined              Patient currently not medically stable for discharge  Consultants:  Palliative care, cardiology, pulmonology, GI, urology   Subjective: No acute issues or events overnight denies headache fevers chills chest pain shortness of breath nausea vomiting  Objective: Vitals:   05/05/24 0400 05/05/24 0813 05/05/24 0815 05/05/24 0817  BP: 105/65     Pulse: 70     Resp: 17     Temp:      TempSrc:      SpO2: 94% 96% 99% 99%  Weight:      Height:        Intake/Output Summary (Last 24 hours) at 05/05/2024 0822 Last data filed at 05/05/2024 0654 Gross per 24 hour  Intake 720 ml  Output  2551 ml  Net -1831 ml   Filed Weights   05/03/24 0343 05/04/24 0235 05/05/24 0300  Weight: 84.2 kg 84.4 kg 84.3 kg    Examination:  General:  Pleasantly resting in bed, No acute distress. HEENT:  Normocephalic atraumatic.  Sclerae nonicteric, noninjected.  Extraocular movements intact bilaterally. Neck:  Without mass or deformity.  Trachea is midline. Lungs:  Clear to auscultate bilaterally without rhonchi, wheeze, or rales. Heart:  Regular rate and rhythm.  Without murmurs, rubs, or gallops. Abdomen: Distended, nontympanic, rectal tube noted with liquid retained stool   Data Reviewed: I have personally reviewed following labs and imaging studies  CBC: Recent Labs  Lab 04/29/24 0334 04/30/24 0340 05/01/24 0355 05/02/24 0408 05/03/24 0314  WBC 6.0 6.3 5.4 5.8 4.7  NEUTROABS 5.6 5.0 3.9 4.3 3.2  HGB 10.9* 10.4* 10.8* 11.5* 10.2*  HCT 32.7* 31.9* 32.6* 34.9* 31.8*  MCV 90.1 90.6 90.3 92.1 91.6  PLT 290 276 278 303 335   Basic Metabolic Panel: Recent Labs  Lab 04/29/24 0334 04/29/24 1354 04/30/24 0340 04/30/24 0756 05/01/24 0354 05/01/24 0355 05/01/24 1558 05/02/24 0408 05/03/24 0314  NA 134*   < > 134*  --   --  138 137 137 134*  K 2.6*   < > 3.1*  --   --  2.2* 3.2* 2.9* 2.8*  CL 106   < > 106  --   --  101 103 102 101  CO2 19*   < > 22  --   --  26 31 19* 27  GLUCOSE 149*   < > 109*  --   --  95 115* 110* 87  BUN 33*   < > 22  --   --  18 16 14 11   CREATININE 0.95   < > 0.79  --   --  0.83 0.99 0.83 0.81  CALCIUM  7.9*   < > 7.6*  --   --  7.7* 7.8* 7.9* 8.0*  MG 2.0  --   --  1.7 1.5*  --   --  2.0  --    < > = values in this interval not displayed.   GFR: Estimated Creatinine Clearance: 85.2 mL/min (by C-G formula based on SCr of 0.81 mg/dL). Liver Function Tests: No results for input(s): AST, ALT, ALKPHOS, BILITOT, PROT, ALBUMIN in the last 168 hours. No results for input(s): LIPASE, AMYLASE in the last 168 hours. No results for  input(s): AMMONIA in the last 168 hours. Coagulation Profile: No results for input(s): INR, PROTIME in the last 168 hours. Cardiac Enzymes: No results for input(s): CKTOTAL, CKMB, CKMBINDEX, TROPONINI in the last 168 hours. BNP (last 3 results) Recent Labs    01/07/24 2301  PROBNP 15,245.0*   HbA1C: No results for input(s): HGBA1C in the last 72 hours. CBG: Recent Labs  Lab 05/04/24 1138 05/04/24 1559 05/04/24 2103 05/05/24 0016 05/05/24 0425  GLUCAP 108* 99 125* 105* 106*   Lipid Profile: No results for input(s): CHOL, HDL, LDLCALC, TRIG, CHOLHDL, LDLDIRECT in the last 72 hours. Thyroid  Function Tests: No results for input(s): TSH, T4TOTAL, FREET4, T3FREE, THYROIDAB in the last 72 hours. Anemia Panel: No results for input(s): VITAMINB12, FOLATE, FERRITIN, TIBC, IRON, RETICCTPCT in the last 72 hours. Sepsis Labs: No results for input(s): PROCALCITON, LATICACIDVEN in the last 168 hours.   Recent Results (from the past 240 hours)  Respiratory (~20 pathogens) panel by PCR     Status: None   Collection Time: 04/28/24  6:06 PM   Specimen: Nasopharyngeal Swab; Respiratory  Result Value Ref Range Status   Adenovirus NOT DETECTED NOT DETECTED Final   Coronavirus 229E NOT DETECTED NOT DETECTED Final    Comment: (NOTE) The Coronavirus on the Respiratory Panel, DOES NOT test for the novel  Coronavirus (2019 nCoV)    Coronavirus HKU1 NOT DETECTED NOT DETECTED Final   Coronavirus NL63 NOT DETECTED NOT DETECTED Final   Coronavirus OC43 NOT DETECTED NOT DETECTED Final   Metapneumovirus NOT DETECTED NOT DETECTED Final   Rhinovirus / Enterovirus NOT DETECTED NOT DETECTED Final   Influenza A NOT DETECTED NOT DETECTED Final   Influenza B NOT DETECTED NOT DETECTED Final   Parainfluenza Virus 1 NOT DETECTED NOT DETECTED Final   Parainfluenza Virus 2 NOT DETECTED NOT DETECTED Final   Parainfluenza Virus 3 NOT DETECTED NOT DETECTED  Final   Parainfluenza Virus 4 NOT DETECTED NOT DETECTED Final   Respiratory Syncytial Virus NOT DETECTED NOT DETECTED Final   Bordetella pertussis NOT DETECTED NOT DETECTED Final   Bordetella Parapertussis NOT DETECTED NOT DETECTED Final   Chlamydophila pneumoniae NOT DETECTED NOT DETECTED Final   Mycoplasma pneumoniae NOT DETECTED NOT DETECTED Final    Comment: Performed at [facility], [address], [place], [place] [address]  MRSA Next Gen by PCR, Nasal     Status: None   Collection Time: 04/29/24  4:31 PM   Specimen: Nasal Mucosa; Nasal Swab  Result Value Ref Range Status   MRSA by PCR Next Gen NOT DETECTED NOT  DETECTED Final    Comment: (NOTE) The GeneXpert MRSA Assay (FDA approved for NASAL specimens only), is one component of a comprehensive MRSA colonization surveillance program. It is not intended to diagnose MRSA infection nor to guide or monitor treatment for MRSA infections. Test performance is not FDA approved in patients less than 44 years old. Performed at [facility], [address], [place], [place] [address]          Radiology Studies: No results found.      Scheduled Meds:  acetylcysteine   2 mL Nebulization BID   amiodarone   200 mg Oral Daily   budesonide  (PULMICORT ) nebulizer solution  0.25 mg Nebulization BID   Chlorhexidine  Gluconate Cloth  6 each Topical Daily   clopidogrel   75 mg Oral Daily   feeding supplement  1 Container Oral TID BM   fiber supplement (BANATROL TF)  60 mL Oral BID   furosemide   40 mg Oral Daily   insulin  aspart  0-6 Units Subcutaneous Q4H   ipratropium  0.5 mg Nebulization BID   lidocaine   1 Application Urethral Once   pantoprazole   40 mg Oral Daily   polyethylene glycol  17 g Oral TID WC   revefenacin   175 mcg Nebulization Daily   rosuvastatin   20 mg Oral Daily   spironolactone   25 mg Oral Daily   tamsulosin   0.4 mg Oral QPC supper   Continuous Infusions:  heparin  2,000 Units/hr (05/04/24 2221)      LOS: 13 days   Time spent:  Elsie JAYSON [place], DO [facility]  If 7PM-7AM, please contact night-coverage [email or web address]  05/05/2024, 8:22 AM

## 2024-05-05 NOTE — Progress Notes (Signed)
 Discontinued rectal tube:  Tube noted tube noted to be leaking all day with thin liquid stools.   Pt refused miralax  x 3 doses d/t stools.   Pt will use bed pan for now.

## 2024-05-05 NOTE — Plan of Care (Signed)

## 2024-05-05 NOTE — Progress Notes (Signed)
 PHARMACY - ANTICOAGULATION CONSULT NOTE  Pharmacy Consult for Heparin  (holding Apixaban ) Indication: atrial fibrillation  Allergies  Allergen Reactions   Codeine Nausea And Vomiting   Latex Rash    Oral blisters when used at dentist    Patient Measurements: Height: 6' (182.9 cm) Weight: 84.3 kg (185 lb 13.6 oz) IBW/kg (Calculated) : 77.6 HEPARIN  DW (KG): 84.3  Vital Signs: Temp: 98.2 F (36.8 C) (12/12 1238) Temp Source: Axillary (12/12 1238) BP: 123/68 (12/12 1238) Pulse Rate: 73 (12/12 1238)  Labs: Recent Labs    05/03/24 0314 05/04/24 0335 05/04/24 1642 05/05/24 0239  HGB 10.2*  --   --   --   HCT 31.8*  --   --   --   PLT 335  --   --   --   HEPARINUNFRC 0.30 0.26* 0.27* 0.37  CREATININE 0.81  --   --   --     Estimated Creatinine Clearance: 85.2 mL/min (by C-G formula based on SCr of 0.81 mg/dL).  Assessment: 78 y/o M on with increasingly distended abdomen > ileus, on apixaban  PTA for afib, holding apixaban  and starting heparin  in case any procedures are needed, last dose of apixaban  was 12/2 around 1930.   12/11: Heparin  level therapeutic at 0.37 with heparin  running at 2000 U/h. No signs of bleeding or issues with the heparin  infusion noted.     Goal of Therapy:  Heparin  level 0.3-0.7 units/ml Monitor platelets by anticoagulation protocol: Yes   Plan:  Cont heparin  gtt at 2000 units/hour  Daily heparin  levels  CBC Q72H  Monitor s/s bleeding   Massie Fila, PharmD Clinical Pharmacist  05/05/2024 12:45 PM

## 2024-05-06 LAB — GLUCOSE, CAPILLARY
Glucose-Capillary: 100 mg/dL — ABNORMAL HIGH (ref 70–99)
Glucose-Capillary: 111 mg/dL — ABNORMAL HIGH (ref 70–99)
Glucose-Capillary: 112 mg/dL — ABNORMAL HIGH (ref 70–99)
Glucose-Capillary: 117 mg/dL — ABNORMAL HIGH (ref 70–99)
Glucose-Capillary: 122 mg/dL — ABNORMAL HIGH (ref 70–99)
Glucose-Capillary: 95 mg/dL (ref 70–99)

## 2024-05-06 LAB — BASIC METABOLIC PANEL WITH GFR
Anion gap: 8 (ref 5–15)
BUN: 8 mg/dL (ref 8–23)
CO2: 30 mmol/L (ref 22–32)
Calcium: 8 mg/dL — ABNORMAL LOW (ref 8.9–10.3)
Chloride: 98 mmol/L (ref 98–111)
Creatinine, Ser: 0.74 mg/dL (ref 0.61–1.24)
GFR, Estimated: >60 mL/min (ref >60)
Glucose, Bld: 100 mg/dL — ABNORMAL HIGH (ref 70–99)
Potassium: 2.4 mmol/L — CL (ref 3.5–5.1)
Sodium: 136 mmol/L (ref 135–145)

## 2024-05-06 LAB — CBC
HCT: 31.1 % — ABNORMAL LOW (ref 39.0–52.0)
Hemoglobin: 10.3 g/dL — ABNORMAL LOW (ref 13.0–17.0)
MCH: 30.2 pg (ref 26.0–34.0)
MCHC: 33.1 g/dL (ref 30.0–36.0)
MCV: 91.2 fL (ref 80.0–100.0)
Platelets: 364 K/uL (ref 150–400)
RBC: 3.41 MIL/uL — ABNORMAL LOW (ref 4.22–5.81)
RDW: 17.9 % — ABNORMAL HIGH (ref 11.5–15.5)
WBC: 3.9 K/uL — ABNORMAL LOW (ref 4.0–10.5)
nRBC: 0 % (ref 0.0–0.2)

## 2024-05-06 LAB — HEPARIN LEVEL (UNFRACTIONATED): Heparin Unfractionated: 0.54 [IU]/mL (ref 0.30–0.70)

## 2024-05-06 MED ORDER — POTASSIUM CHLORIDE CRYS ER 20 MEQ PO TBCR
40.0000 meq | EXTENDED_RELEASE_TABLET | ORAL | Status: AC
  Administered 2024-05-06 (×4): 40 meq via ORAL
  Filled 2024-05-06 (×4): qty 2

## 2024-05-06 NOTE — Progress Notes (Signed)
 Date and time results received: 05/06/2024 0724  Test: Potassium Critical Value: 2.4  Name of Provider Notified: Lue, MD  Orders Received? Or Actions Taken?: Orders Received - See Orders for details

## 2024-05-06 NOTE — Progress Notes (Signed)
 PROGRESS NOTE    THERMON ZULAUF  FMW:[id number] DOB: 30-Mar-1946 DOA: 04/21/2024 PCP: Wonda Worth SQUIBB, PA   Brief Narrative:  78 y.o. 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 Of note, patient had recent complicated admission for septic shock, cardiogenic shock, intestinal pneumatosis/ileus/pseudoobstruction/Ogilvie syndrome. Now admitted for recurrent Ogilvie syndrome, suspected gastroenterocolitis, AKI on CKD, hypotension and acute debilitation.   Assessment & Plan:   Principal Problem:   Enterocolitis Active Problems:   Ogilvie syndrome   Chronic systolic CHF (congestive heart failure) (HCC)   Hypokalemia   HLD (hyperlipidemia)   Acute kidney injury   DM2 (diabetes mellitus, type 2) (HCC)   Postural dizziness with presyncope   Ileus, unspecified (HCC)   Elevated troponin   Fall at home, initial encounter   Hypotension   Paroxysmal atrial flutter (HCC)   Anemia of chronic disease   Adynamic ileus (HCC)   Chronic combined systolic and diastolic congestive heart failure (HCC)  Diffuse progressive ileus Recurrent Ogilvie Syndrome/pseudoobstruction Severe protein caloric malnutrition Profound diffuse diarrhea History of recent hospitalization (01/22/24-02/25/24) for similar GI symptoms with extensive workup. Underwent colonic decompression (02/18/24) with improvement Currently afebrile, with no leukocytosis C diff and enteric PCR panel negative CT A/P w/o contrast with noted  diffuse mild dilatation Repeat abdominal x-ray on 12/3 with diffuse ileus involving colon and small bowel GI consulted, no plan for intervention at this time given elevated risk of complications given sedation requirements Diarrhea ongoing but markedly improving after decreased dose of MiraLAX , rectal tube removed with moderate improvement in symptoms   Acute hypoxic respiratory failure, resolved Currently on room air at rest D-dimer mildly elevated Procalcitonin 0.26 Likely in the setting of abdominal distension/limited pulmonary excursion from underlying ileus/pseudoobstruction/gastroenterocolitis  CTA chest negative for PE PCCM consulted - completed 5 days of IV antibiotics, continue supportive care  - DuoNebs, nebulizers, Mucomyst    CHFrEF Ischemic cardiomyopathy Recent prolonged hospitalization complicated by cardiogenic shock with acute CHF exacerbation in the setting of LAD stent stenosis  Last echo 01/2024 with LVEF 20-25% CXR 12/2 showed bilateral opacities Cardiology consulted, appreciate recs - continue lasix , spironolactone , continue to hold entresto , digoxin , Jardiance    Persistent Afib/flutter with RVR Heart rate improved, now back in sinus rhythm as of 12/7 S/p TEE CV on 02/21/2024 Cardiology on board, appreciate recs - continue amiodarone , IV heparin  (Eliquis  held given above)   Gross hematuria Resolved Noted hematuria on 12/4 in the setting of anticoagulation Renal ultrasound showed enlarged prostate protruding into the inferior bladder, layering debris's in the bladder could reflect blood clot Urology consulted, placed foley on 12/5 Continue flomax    CAD s/p DES to LAD 8/25 Hyperlipidemia Recent balloon angioplasty of LAD stent thrombosis (02/01/2024) due to inability to tolerate oral DAPT  History of remote PCI to RI and RCA EKG with known RBBB, LAFB, no significant changes from prior Troponin 67 < 71 Cardiology consulted - cont home plavix , held Eliquis , continue  Crestor    Hypokalemia/hypomagnesemia Likely in the setting of ileus, frequent diarrhea - replete as appropriate   AKI on CKD stage II Cr 1.11 < 2.1,  baseline 0.9-1.5 Likely in the setting of dehydration from diarrhea   Peptic ulcer disease Recent hospitalization with EGD (02/02/24) showing esophageal ulcer without bleeding, grade C reflux esophagitis without bleeding, duodenal erosions Cont pantoprazole    T2DM Hold home Jardiance  Cont SSI   Incidental R thyroid  nodule Noted on CT C spine TSH normal  - recommend outpatient thyroid  US    Chronic normocytic anemia - stable   Acute ambulatory dysfunction Mechanical fall PT/OT following- rec SNF given profound weakness from baseline   GOC discussion Patient with poor prognosis, recurrent severe ileus, advanced CHF Palliative care consulted, appreciate recs  DVT prophylaxis: SCDs Start: 04/22/24 0137 Code Status:   Code Status: Limited: Do not attempt resuscitation (DNR) -DNR-LIMITED -Do Not Intubate/DNI  Family Communication: None present  Status is: Inpatient  Dispo: The patient is from: Home              Anticipated d/c is to: SNF              Anticipated d/c date is: To be determined              Patient currently not medically stable for discharge  Consultants:  Palliative care, cardiology, pulmonology, GI, urology   Subjective: No acute issues or events overnight denies headache fevers chills chest pain shortness of breath nausea vomiting  Objective: Vitals:   05/05/24 2008 05/05/24 2341 05/06/24 0339 05/06/24 0727  BP:  (!) 113/56 116/63 112/69  Pulse:  70 70 81  Resp:  16 (!) 23 18  Temp:  98 F (36.7 C) (!) 97.5 F (36.4 C) 97.6 F (36.4 C)  TempSrc:  Oral Oral Oral  SpO2: 100% 96% 92% 95%  Weight:   87.1 kg   Height:        Intake/Output Summary (Last 24 hours) at 05/06/2024 0745 Last data filed at 05/06/2024 0732 Gross per 24 hour  Intake 2126.34 ml  Output 1500 ml  Net 626.34 ml   Filed  Weights   05/04/24 0235 05/05/24 0300 05/06/24 0339  Weight: 84.4 kg 84.3 kg 87.1 kg    Examination:  General:  Pleasantly resting in bed, No acute distress. HEENT:  Normocephalic atraumatic.  Sclerae nonicteric, noninjected.  Extraocular movements intact bilaterally. Neck:  Without mass or deformity.  Trachea is midline. Lungs:  Clear to auscultate bilaterally without rhonchi, wheeze, or rales. Heart:  Regular rate and rhythm.  Without murmurs, rubs, or gallops. Abdomen: Distended, nontympanic, rectal tube noted with liquid retained stool   Data Reviewed: I have personally reviewed following labs and imaging studies  CBC: Recent Labs  Lab 04/30/24 0340 05/01/24 0355 05/02/24 0408 05/03/24 0314 05/06/24 0610  WBC 6.3 5.4 5.8 4.7 3.9*  NEUTROABS 5.0 3.9 4.3 3.2  --   HGB 10.4* 10.8* 11.5* 10.2* 10.3*  HCT 31.9* 32.6* 34.9* 31.8* 31.1*  MCV 90.6 90.3 92.1 91.6 91.2  PLT 276 278 303 335 364   Basic Metabolic Panel: Recent Labs  Lab 04/30/24 0756 05/01/24 0354 05/01/24 0355 05/01/24 1558 05/02/24 0408 05/03/24 0314 05/06/24 0610  NA  --   --  138 137 137 134* 136  K  --   --  2.2* 3.2* 2.9* 2.8* 2.4*  CL  --   --  101 103 102 101 98  CO2  --   --  26 31 19* 27 30  GLUCOSE  --   --  95 115* 110* 87 100*  BUN  --   --  18 16 14  11  8  CREATININE  --   --  0.83 0.99 0.83 0.81 0.74  CALCIUM   --   --  7.7* 7.8* 7.9* 8.0* 8.0*  MG 1.7 1.5*  --   --  2.0  --   --    GFR: Estimated Creatinine Clearance: 86.2 mL/min (by C-G formula based on SCr of 0.74 mg/dL). Liver Function Tests: No results for input(s): AST, ALT, ALKPHOS, BILITOT, PROT, ALBUMIN in the last 168 hours. No results for input(s): LIPASE, AMYLASE in the last 168 hours. No results for input(s): AMMONIA in the last 168 hours. Coagulation Profile: No results for input(s): INR, PROTIME in the last 168 hours. Cardiac Enzymes: No results for input(s): CKTOTAL, CKMB, CKMBINDEX,  TROPONINI in the last 168 hours. BNP (last 3 results) Recent Labs    01/07/24 2301  PROBNP 15,245.0*   HbA1C: No results for input(s): HGBA1C in the last 72 hours. CBG: Recent Labs  Lab 05/05/24 1707 05/05/24 2100 05/05/24 2342 05/06/24 0341 05/06/24 0727  GLUCAP 111* 115* 109* 112* 100*   Lipid Profile: No results for input(s): CHOL, HDL, LDLCALC, TRIG, CHOLHDL, LDLDIRECT in the last 72 hours. Thyroid  Function Tests: No results for input(s): TSH, T4TOTAL, FREET4, T3FREE, THYROIDAB in the last 72 hours. Anemia Panel: No results for input(s): VITAMINB12, FOLATE, FERRITIN, TIBC, IRON, RETICCTPCT in the last 72 hours. Sepsis Labs: No results for input(s): PROCALCITON, LATICACIDVEN in the last 168 hours.   Recent Results (from the past 240 hours)  Respiratory (~20 pathogens) panel by PCR     Status: None   Collection Time: 04/28/24  6:06 PM   Specimen: Nasopharyngeal Swab; Respiratory  Result Value Ref Range Status   Adenovirus NOT DETECTED NOT DETECTED Final   Coronavirus 229E NOT DETECTED NOT DETECTED Final    Comment: (NOTE) The Coronavirus on the Respiratory Panel, DOES NOT test for the novel  Coronavirus (2019 nCoV)    Coronavirus HKU1 NOT DETECTED NOT DETECTED Final   Coronavirus NL63 NOT DETECTED NOT DETECTED Final   Coronavirus OC43 NOT DETECTED NOT DETECTED Final   Metapneumovirus NOT DETECTED NOT DETECTED Final   Rhinovirus / Enterovirus NOT DETECTED NOT DETECTED Final   Influenza A NOT DETECTED NOT DETECTED Final   Influenza B NOT DETECTED NOT DETECTED Final   Parainfluenza Virus 1 NOT DETECTED NOT DETECTED Final   Parainfluenza Virus 2 NOT DETECTED NOT DETECTED Final   Parainfluenza Virus 3 NOT DETECTED NOT DETECTED Final   Parainfluenza Virus 4 NOT DETECTED NOT DETECTED Final   Respiratory Syncytial Virus NOT DETECTED NOT DETECTED Final   Bordetella pertussis NOT DETECTED NOT DETECTED Final   Bordetella  Parapertussis NOT DETECTED NOT DETECTED Final   Chlamydophila pneumoniae NOT DETECTED NOT DETECTED Final   Mycoplasma pneumoniae NOT DETECTED NOT DETECTED Final    Comment: Performed at [facility], [address], [place], [place] [address]  MRSA Next Gen by PCR, Nasal     Status: None   Collection Time: 04/29/24  4:31 PM   Specimen: Nasal Mucosa; Nasal Swab  Result Value Ref Range Status   MRSA by PCR Next Gen NOT DETECTED NOT DETECTED Final    Comment: (NOTE) The GeneXpert MRSA Assay (FDA approved for NASAL specimens only), is one component of a comprehensive MRSA colonization surveillance program. It is not intended to diagnose MRSA infection nor to guide or monitor treatment for MRSA infections. Test performance is not FDA approved in patients less than 103 years old. Performed at [facility], [address] [place], [place] [address]          Radiology Studies: No results found.      Scheduled Meds:  acetylcysteine   2 mL Nebulization BID   amiodarone   200 mg Oral Daily   budesonide  (PULMICORT ) nebulizer solution  0.25 mg Nebulization BID   Chlorhexidine  Gluconate Cloth  6 each Topical Daily   clopidogrel   75 mg Oral Daily   feeding supplement  1 Container Oral TID BM   fiber supplement (BANATROL TF)  60 mL Oral BID   furosemide   40 mg Oral Daily   Gerhardt's butt cream   Topical BID   insulin  aspart  0-6 Units Subcutaneous Q4H   ipratropium  0.5 mg Nebulization BID   lidocaine   1 Application Urethral Once   pantoprazole   40 mg Oral Daily   polyethylene glycol  17 g Oral Daily   potassium chloride   40 mEq Oral Q4H   revefenacin   175 mcg Nebulization Daily   rosuvastatin   20 mg Oral Daily   spironolactone   25 mg Oral Daily   tamsulosin   0.4 mg Oral QPC supper   Continuous Infusions:  heparin  2,000 Units/hr (05/06/24 0534)     LOS: 14 days   Time spent:  Elsie JAYSON Montclair, DO [facility]  If 7PM-7AM, please contact  night-coverage [email or web address]  05/06/2024, 7:45 AM

## 2024-05-06 NOTE — Progress Notes (Signed)
 PHARMACY - ANTICOAGULATION CONSULT NOTE  Pharmacy Consult for Heparin  (holding Apixaban ) Indication: atrial fibrillation  Allergies  Allergen Reactions   Codeine Nausea And Vomiting   Latex Rash    Oral blisters when used at dentist    Patient Measurements: Height: 6' (182.9 cm) Weight: 87.1 kg (192 lb 0.3 oz) IBW/kg (Calculated) : 77.6 HEPARIN  DW (KG): 84.3  Vital Signs: Temp: 97.6 F (36.4 C) (12/13 0727) Temp Source: Oral (12/13 0727) BP: 112/69 (12/13 0727) Pulse Rate: 81 (12/13 0727)  Labs: Recent Labs    05/04/24 1642 05/05/24 0239 05/06/24 0610  HGB  --   --  10.3*  HCT  --   --  31.1*  PLT  --   --  364  HEPARINUNFRC 0.27* 0.37 0.54  CREATININE  --   --  0.74    Estimated Creatinine Clearance: 86.2 mL/min (by C-G formula based on SCr of 0.74 mg/dL).  Assessment: 78 y/o M on with increasingly distended abdomen > ileus, on apixaban  PTA for afib, holding apixaban  and starting heparin  in case any procedures are needed, last dose of apixaban  was 12/2 around 1930.   12/13: Heparin  level therapeutic at 0.54 with heparin  running at 2000 U/h. No signs of bleeding or issues with the heparin  infusion per RN.     Goal of Therapy:  Heparin  level 0.3-0.7 units/ml Monitor platelets by anticoagulation protocol: Yes   Plan:  Cont heparin  gtt at 2000 units/hour  Daily heparin  levels  CBC Q72H  Monitor s/s bleeding   Dionicia Canavan, PharmD, RPh PGY1 Acute Care Pharmacy Resident [facility]  05/06/2024 8:46 AM

## 2024-05-07 DIAGNOSIS — K529 Noninfective gastroenteritis and colitis, unspecified: Secondary | ICD-10-CM | POA: Diagnosis not present

## 2024-05-07 LAB — GLUCOSE, CAPILLARY
Glucose-Capillary: 103 mg/dL — ABNORMAL HIGH (ref 70–99)
Glucose-Capillary: 115 mg/dL — ABNORMAL HIGH (ref 70–99)
Glucose-Capillary: 139 mg/dL — ABNORMAL HIGH (ref 70–99)
Glucose-Capillary: 161 mg/dL — ABNORMAL HIGH (ref 70–99)
Glucose-Capillary: 88 mg/dL (ref 70–99)
Glucose-Capillary: 97 mg/dL (ref 70–99)

## 2024-05-07 LAB — HEPARIN LEVEL (UNFRACTIONATED)
Heparin Unfractionated: 0.71 [IU]/mL — ABNORMAL HIGH (ref 0.30–0.70)
Heparin Unfractionated: 0.67 [IU]/mL (ref 0.30–0.70)

## 2024-05-07 MED ORDER — HEPARIN (PORCINE) 25000 UT/250ML-% IV SOLN
1750.0000 [IU]/h | INTRAVENOUS | Status: DC
  Administered 2024-05-07 (×2): 1900 [IU]/h via INTRAVENOUS
  Administered 2024-05-08 – 2024-05-11 (×5): 1750 [IU]/h via INTRAVENOUS
  Filled 2024-05-07 (×7): qty 250

## 2024-05-07 NOTE — Progress Notes (Signed)
 PHARMACY - ANTICOAGULATION CONSULT NOTE  Pharmacy Consult for Heparin  (holding Apixaban ) Indication: atrial fibrillation  Allergies  Allergen Reactions   Codeine Nausea And Vomiting   Latex Rash    Oral blisters when used at dentist    Patient Measurements: Height: 6' (182.9 cm) Weight: 82.8 kg (182 lb 8.7 oz) IBW/kg (Calculated) : 77.6 HEPARIN  DW (KG): 84.3  Vital Signs: Temp: 97.6 F (36.4 C) (12/14 0806) Temp Source: Tympanic (12/14 0806) BP: 111/64 (12/14 0806) Pulse Rate: 72 (12/14 0806)  Labs: Recent Labs    05/05/24 0239 05/06/24 0610 05/07/24 0345  HGB  --  10.3*  --   HCT  --  31.1*  --   PLT  --  364  --   HEPARINUNFRC 0.37 0.54 0.71*  CREATININE  --  0.74  --     Estimated Creatinine Clearance: 86.2 mL/min (by C-G formula based on SCr of 0.74 mg/dL).  Assessment: 78 y/o M on with increasingly distended abdomen > ileus, on apixaban  PTA for afib, holding apixaban  and starting heparin  in case any procedures are needed, last dose of apixaban  was 12/2 around 1930.   12/14: Heparin  level supratherapeutic at 0.71 with heparin  running at 2000 U/h. No signs of bleeding or issues with the heparin  infusion per RN.     Goal of Therapy:  Heparin  level 0.3-0.7 units/ml Monitor platelets by anticoagulation protocol: Yes   Plan:  Decrease heparin  gtt to 1900 units/hour  Recheck heparin  level in 6 hours Daily heparin  levels  CBC Q72H  Monitor s/s bleeding   Dionicia Canavan, PharmD, RPh PGY1 Acute Care Pharmacy Resident [facility]  05/07/2024 8:08 AM

## 2024-05-07 NOTE — Progress Notes (Signed)
 PHARMACY - ANTICOAGULATION CONSULT NOTE  Pharmacy Consult for Heparin  (holding Apixaban ) Indication: atrial fibrillation  Allergies  Allergen Reactions   Codeine Nausea And Vomiting   Latex Rash    Oral blisters when used at dentist    Patient Measurements: Height: 6' (182.9 cm) Weight: 82.8 kg (182 lb 8.7 oz) IBW/kg (Calculated) : 77.6 HEPARIN  DW (KG): 84.3  Vital Signs: Temp: 98.2 F (36.8 C) (12/14 1618) Temp Source: Oral (12/14 1618) BP: 116/65 (12/14 1618) Pulse Rate: 79 (12/14 1618)  Labs: Recent Labs    05/06/24 0610 05/07/24 0345 05/07/24 1613  HGB 10.3*  --   --   HCT 31.1*  --   --   PLT 364  --   --   HEPARINUNFRC 0.54 0.71* 0.67  CREATININE 0.74  --   --     Estimated Creatinine Clearance: 86.2 mL/min (by C-G formula based on SCr of 0.74 mg/dL).  Assessment: 78 y/o M on with increasingly distended abdomen > ileus, on apixaban  PTA for afib, holding apixaban  and starting heparin  in case any procedures are needed, last dose of apixaban  was 12/2 around 1930.   12/14: Heparin  level therapeutic at 0.67 with heparin  running at 1900 U/h.    Goal of Therapy:  Heparin  level 0.3-0.7 units/ml Monitor platelets by anticoagulation protocol: Yes   Plan:  Continue heparin  gtt at 1900 units/hour  Daily heparin  levels  CBC Q72H  Monitor s/s bleeding   Larraine Brazier, PharmD Clinical Pharmacist 05/07/2024  4:57 PM **Pharmacist phone directory can now be found on [email or web address] ([email or web address]).  Listed under [facility].

## 2024-05-07 NOTE — Progress Notes (Signed)
 PROGRESS NOTE    Chase Combs  FMW:[id number] DOB: 1946/05/17 DOA: 04/21/2024 PCP: Wonda Worth SQUIBB, PA   Brief Narrative:  78 y.o. 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 Of note, patient had recent complicated admission for septic shock, cardiogenic shock, intestinal pneumatosis/ileus/pseudoobstruction/Ogilvie syndrome. Now admitted for recurrent Ogilvie syndrome, suspected gastroenterocolitis, AKI on CKD, hypotension and acute debilitation.   Assessment & Plan:   Principal Problem:   Enterocolitis Active Problems:   Ogilvie syndrome   Chronic systolic CHF (congestive heart failure) (HCC)   Hypokalemia   HLD (hyperlipidemia)   Acute kidney injury   DM2 (diabetes mellitus, type 2) (HCC)   Postural dizziness with presyncope   Ileus, unspecified (HCC)   Elevated troponin   Fall at home, initial encounter   Hypotension   Paroxysmal atrial flutter (HCC)   Anemia of chronic disease   Adynamic ileus (HCC)   Chronic combined systolic and diastolic congestive heart failure (HCC)  Diffuse progressive ileus Recurrent Ogilvie Syndrome/pseudoobstruction Severe protein caloric malnutrition Profound diffuse diarrhea History of recent hospitalization (01/22/24-02/25/24) for similar GI symptoms with extensive workup. Underwent colonic decompression (02/18/24) with improvement Currently afebrile, with no leukocytosis C diff and enteric PCR panel negative CT A/P w/o contrast with noted  diffuse mild dilatation Repeat abdominal x-ray on 12/3 with diffuse ileus involving colon and small bowel GI consulted, no plan for intervention at this time given elevated risk of complications given sedation requirements Diarrhea markedly improving after decreased dose of MiraLAX , rectal tube removed with moderate improvement in symptoms - continues to improve over the last 48h  Acute hypoxic respiratory failure, resolved Currently on room air at rest D-dimer mildly elevated Procalcitonin 0.26 Likely in the setting of abdominal distension/limited pulmonary excursion from underlying ileus/pseudoobstruction/gastroenterocolitis  CTA chest negative for PE PCCM consulted - completed 5 days of IV antibiotics, continue supportive care  - DuoNebs, nebulizers, Mucomyst    CHFrEF Ischemic cardiomyopathy Recent prolonged hospitalization complicated by cardiogenic shock with acute CHF exacerbation in the setting of LAD stent stenosis  Last echo 01/2024 with LVEF 20-25% CXR 12/2 showed bilateral opacities Cardiology consulted, appreciate recs - continue lasix , spironolactone , continue to hold entresto , digoxin , Jardiance    Persistent Afib/flutter with RVR Heart rate improved, now back in sinus rhythm as of 12/7 S/p TEE CV on 02/21/2024 Cardiology on board, appreciate recs - continue amiodarone , IV heparin  (Eliquis  held given above)   Gross hematuria Resolved Noted hematuria on 12/4 in the setting of anticoagulation Renal ultrasound showed enlarged prostate protruding into the inferior bladder, layering debris's in the bladder could reflect blood clot Urology consulted, placed foley on 12/5 Continue flomax    CAD s/p DES to LAD 8/25 Hyperlipidemia Recent balloon angioplasty of LAD stent thrombosis (02/01/2024) due to inability to tolerate oral DAPT  History of remote PCI to RI and RCA EKG with known RBBB, LAFB, no significant changes from prior Troponin 67 < 71 Cardiology consulted - cont home  plavix , held Eliquis , continue Crestor    Hypokalemia/hypomagnesemia Likely in the setting of ileus, frequent diarrhea - replete as appropriate   AKI on CKD stage  II Cr 1.11 < 2.1, baseline 0.9-1.5 Likely in the setting of dehydration from diarrhea   Peptic ulcer disease Recent hospitalization with EGD (02/02/24) showing esophageal ulcer without bleeding, grade C reflux esophagitis without bleeding, duodenal erosions Cont pantoprazole    T2DM Hold home Jardiance  Cont SSI   Incidental R thyroid  nodule Noted on CT C spine TSH normal  - recommend outpatient thyroid  US    Chronic normocytic anemia - stable   Acute ambulatory dysfunction Mechanical fall PT/OT following- rec SNF given profound weakness from baseline   GOC discussion Patient with poor prognosis, recurrent severe ileus, advanced CHF Palliative care consulted, appreciate recs  DVT prophylaxis: SCDs Start: 04/22/24 0137 Code Status:   Code Status: Limited: Do not attempt resuscitation (DNR) -DNR-LIMITED -Do Not Intubate/DNI  Family Communication: None present  Status is: Inpatient  Dispo: The patient is from: Home              Anticipated d/c is to: SNF              Anticipated d/c date is: To be determined              Patient currently not medically stable for discharge  Consultants:  Palliative care, cardiology, pulmonology, GI, urology   Subjective: No acute issues or events overnight denies headache fevers chills chest pain shortness of breath nausea vomiting  Objective: Vitals:   05/06/24 2028 05/06/24 2355 05/07/24 0400 05/07/24 0806  BP:  112/65 116/63 111/64  Pulse:  78 75 72  Resp:  20 20 20   Temp:  98 F (36.7 C) 98 F (36.7 C) 97.6 F (36.4 C)  TempSrc:  Oral Oral Tympanic  SpO2: 100% 95% 93% 97%  Weight:   82.8 kg   Height:        Intake/Output Summary (Last 24 hours) at 05/07/2024 0915 Last data filed at 05/07/2024 0014 Gross per 24 hour  Intake 311.77 ml  Output 600 ml  Net  -288.23 ml   Filed Weights   05/05/24 0300 05/06/24 0339 05/07/24 0400  Weight: 84.3 kg 87.1 kg 82.8 kg    Examination:  General:  Pleasantly resting in bed, No acute distress. HEENT:  Normocephalic atraumatic.  Sclerae nonicteric, noninjected.  Extraocular movements intact bilaterally. Neck:  Without mass or deformity.  Trachea is midline. Lungs:  Clear to auscultate bilaterally without rhonchi, wheeze, or rales. Heart:  Regular rate and rhythm.  Without murmurs, rubs, or gallops. Abdomen: Distended, nontympanic, rectal tube noted with liquid retained stool   Data Reviewed: I have personally reviewed following labs and imaging studies  CBC: Recent Labs  Lab 05/01/24 0355 05/02/24 0408 05/03/24 0314 05/06/24 0610  WBC 5.4 5.8 4.7 3.9*  NEUTROABS 3.9 4.3 3.2  --   HGB 10.8* 11.5* 10.2* 10.3*  HCT 32.6* 34.9* 31.8* 31.1*  MCV 90.3 92.1 91.6 91.2  PLT 278 303 335 364   Basic Metabolic Panel: Recent Labs  Lab 05/01/24 0354 05/01/24 0355 05/01/24 1558 05/02/24 0408 05/03/24 0314 05/06/24 0610  NA  --  138 137 137 134* 136  K  --  2.2* 3.2* 2.9* 2.8* 2.4*  CL  --  101 103 102 101 98  CO2  --  26 31 19* 27 30  GLUCOSE  --  95 115* 110* 87 100*  BUN  --  18 16 14 11 8   CREATININE  --  0.83 0.99 0.83 0.81 0.74  CALCIUM   --  7.7* 7.8* 7.9* 8.0* 8.0*  MG 1.5*  --   --  2.0  --   --    GFR: Estimated Creatinine Clearance: 86.2 mL/min (by C-G formula based on SCr of 0.74 mg/dL). Liver Function Tests: No results for input(s): AST, ALT, ALKPHOS, BILITOT, PROT, ALBUMIN in the last 168 hours. No results for input(s): LIPASE, AMYLASE in the last 168 hours. No results for input(s): AMMONIA in the last 168 hours. Coagulation Profile: No results for input(s): INR, PROTIME in the last 168 hours. Cardiac Enzymes: No results for input(s): CKTOTAL, CKMB, CKMBINDEX, TROPONINI in the last 168 hours. BNP (last 3 results) Recent Labs    01/07/24 2301   PROBNP 15,245.0*   HbA1C: No results for input(s): HGBA1C in the last 72 hours. CBG: Recent Labs  Lab 05/06/24 1608 05/06/24 2015 05/06/24 2357 05/07/24 0411 05/07/24 0809  GLUCAP 95 122* 117* 115* 139*   Lipid Profile: No results for input(s): CHOL, HDL, LDLCALC, TRIG, CHOLHDL, LDLDIRECT in the last 72 hours. Thyroid  Function Tests: No results for input(s): TSH, T4TOTAL, FREET4, T3FREE, THYROIDAB in the last 72 hours. Anemia Panel: No results for input(s): VITAMINB12, FOLATE, FERRITIN, TIBC, IRON, RETICCTPCT in the last 72 hours. Sepsis Labs: No results for input(s): PROCALCITON, LATICACIDVEN in the last 168 hours.   Recent Results (from the past 240 hours)  Respiratory (~20 pathogens) panel by PCR     Status: None   Collection Time: 04/28/24  6:06 PM   Specimen: Nasopharyngeal Swab; Respiratory  Result Value Ref Range Status   Adenovirus NOT DETECTED NOT DETECTED Final   Coronavirus 229E NOT DETECTED NOT DETECTED Final    Comment: (NOTE) The Coronavirus on the Respiratory Panel, DOES NOT test for the novel  Coronavirus (2019 nCoV)    Coronavirus HKU1 NOT DETECTED NOT DETECTED Final   Coronavirus NL63 NOT DETECTED NOT DETECTED Final   Coronavirus OC43 NOT DETECTED NOT DETECTED Final   Metapneumovirus NOT DETECTED NOT DETECTED Final   Rhinovirus / Enterovirus NOT DETECTED NOT DETECTED Final   Influenza A NOT DETECTED NOT DETECTED Final   Influenza B NOT DETECTED NOT DETECTED Final   Parainfluenza Virus 1 NOT DETECTED NOT DETECTED Final   Parainfluenza Virus 2 NOT DETECTED NOT DETECTED Final   Parainfluenza Virus 3 NOT DETECTED NOT DETECTED Final   Parainfluenza Virus 4 NOT DETECTED NOT DETECTED Final   Respiratory Syncytial Virus NOT DETECTED NOT DETECTED Final   Bordetella pertussis NOT DETECTED NOT DETECTED Final   Bordetella Parapertussis NOT DETECTED NOT DETECTED Final   Chlamydophila pneumoniae NOT DETECTED NOT DETECTED  Final   Mycoplasma pneumoniae NOT DETECTED NOT DETECTED Final    Comment: Performed at [facility], [address], [place], [place] [address]  MRSA Next Gen by PCR, Nasal     Status: None   Collection Time: 04/29/24  4:31 PM   Specimen: Nasal Mucosa; Nasal Swab  Result Value Ref Range Status   MRSA by PCR Next Gen NOT DETECTED NOT DETECTED Final    Comment: (NOTE) The GeneXpert MRSA Assay (FDA approved for NASAL specimens only), is one component of a comprehensive MRSA colonization surveillance program. It is not intended to diagnose MRSA infection nor to guide or monitor treatment for MRSA infections. Test performance is not FDA approved in patients less than 63 years old. Performed at [facility], [address], [place], [place] [address]          Radiology Studies: No results found.      Scheduled Meds:  acetylcysteine   2 mL Nebulization BID   amiodarone   200 mg Oral Daily   budesonide  (PULMICORT ) nebulizer solution  0.25 mg Nebulization BID   Chlorhexidine  Gluconate Cloth  6 each Topical Daily   clopidogrel   75 mg Oral Daily   feeding supplement  1 Container Oral TID BM   fiber supplement (BANATROL TF)  60 mL Oral BID   furosemide   40 mg Oral Daily   Gerhardt's butt cream   Topical BID   insulin  aspart  0-6 Units Subcutaneous Q4H   ipratropium  0.5 mg Nebulization BID   lidocaine   1 Application Urethral Once   pantoprazole   40 mg Oral Daily   polyethylene glycol  17 g Oral Daily   revefenacin   175 mcg Nebulization Daily   rosuvastatin   20 mg Oral Daily   spironolactone   25 mg Oral Daily   tamsulosin   0.4 mg Oral QPC supper   Continuous Infusions:  heparin  1,900 Units/hr (05/07/24 0849)     LOS: 15 days   Time spent:  Elsie JAYSON Montclair, DO [facility]  If 7PM-7AM, please contact night-coverage [email or web address]  05/07/2024, 9:15 AM

## 2024-05-08 ENCOUNTER — Other Ambulatory Visit (HOSPITAL_COMMUNITY): Payer: Self-pay

## 2024-05-08 DIAGNOSIS — Z8546 Personal history of malignant neoplasm of prostate: Secondary | ICD-10-CM

## 2024-05-08 DIAGNOSIS — I5020 Unspecified systolic (congestive) heart failure: Secondary | ICD-10-CM

## 2024-05-08 LAB — GLUCOSE, CAPILLARY
Glucose-Capillary: 103 mg/dL — ABNORMAL HIGH (ref 70–99)
Glucose-Capillary: 103 mg/dL — ABNORMAL HIGH (ref 70–99)
Glucose-Capillary: 126 mg/dL — ABNORMAL HIGH (ref 70–99)
Glucose-Capillary: 108 mg/dL — ABNORMAL HIGH (ref 70–99)
Glucose-Capillary: 110 mg/dL — ABNORMAL HIGH (ref 70–99)
Glucose-Capillary: 120 mg/dL — ABNORMAL HIGH (ref 70–99)

## 2024-05-08 LAB — HEPARIN LEVEL (UNFRACTIONATED): Heparin Unfractionated: 0.75 [IU]/mL — ABNORMAL HIGH (ref 0.30–0.70)

## 2024-05-08 NOTE — Progress Notes (Signed)
 PHARMACY - ANTICOAGULATION CONSULT NOTE  Pharmacy Consult for Heparin  (holding Apixaban ) Indication: atrial fibrillation  Allergies  Allergen Reactions   Codeine Nausea And Vomiting   Latex Rash    Oral blisters when used at dentist    Patient Measurements: Height: 6' (182.9 cm) Weight: 81.6 kg (179 lb 14.3 oz) IBW/kg (Calculated) : 77.6 HEPARIN  DW (KG): 84.3  Vital Signs: Temp: 97.7 F (36.5 C) (12/15 0754) Temp Source: Oral (12/15 0754) BP: 109/75 (12/15 0754) Pulse Rate: 75 (12/15 0754)  Labs: Recent Labs    05/06/24 0610 05/07/24 0345 05/07/24 1613 05/08/24 0355  HGB 10.3*  --   --   --   HCT 31.1*  --   --   --   PLT 364  --   --   --   HEPARINUNFRC 0.54 0.71* 0.67 0.75*  CREATININE 0.74  --   --   --     Estimated Creatinine Clearance: 86.2 mL/min (by C-G formula based on SCr of 0.74 mg/dL).  Assessment: 78 y/o M on with increasingly distended abdomen > ileus, on apixaban  PTA for afib, holding apixaban  and starting heparin  in case any procedures are needed, last dose of apixaban  was 12/2 around 1930.   Heparin  level slightly elevated this AM  Goal of Therapy:  Heparin  level 0.3-0.7 units/ml Monitor platelets by anticoagulation protocol: Yes   Plan:  Decrease heparin  to 1750 units / hr Daily heparin  levels  CBC Q72H  Monitor s/s bleeding   Thank you. Olam Monte, PharmD 05/08/2024  8:40 AM **Pharmacist phone directory can now be found on [email or web address] ([email or web address]).  Listed under [facility].

## 2024-05-08 NOTE — Progress Notes (Signed)
 Daily Progress Note   Date: 05/08/2024   Patient Name: Chase Combs  DOB: 03/01/1946  MRN: [id number]  Age / Sex: 78 y.o., male  Attending Physician: Lue Elsie BROCKS, MD Primary Care Physician: Wonda Worth SQUIBB, PA Admit Date: 04/21/2024 Length of Stay: 16 days  Reason for Follow-up: Establishing goals of care  Past Medical History:  Diagnosis Date   Abdominal hernia    per pt   Arthritis    hands   Benign localized prostatic hyperplasia with lower urinary tract symptoms (LUTS)    CAD (coronary artery disease) 05/2004   cardiologist--- dr anner;  positive stress test , had outpt cardiac cath 05-27-2004 stenosis RI;  06-18-2004 cath w/ PCI and BMS x1 to pRI;   STEMI 02-16-2009  s/p cath w/ PCI thrombectomy for total occluded RCA , BMS x1 to pRCA;  nuclear stress test 03-15-2012 low risk no ischemia but sig inferior-inferolateral infarct , ef 51%   Dyslipidemia    Essential hypertension    Heart murmur    History of COVID-19 05/2020   per pt mild symptoms that resolved   History of ST elevation myocardial infarction (STEMI) 02/16/2009   inferior STEMI   cath w/ pci w/ BMS to pRCA   History of urinary retention    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   urologist--- dr borden/ radiation oncology-- dr patrcia;  dx 12/ 2022,  Gleason 4+5, PSA 24.6, volume 94.6cc;  plan to start IMRT   OSA (obstructive sleep apnea)    per pt dx approx 2008, no cpap intolerant   PONV (postoperative nausea and vomiting)    Pre-diabetes    S/p bare metal coronary artery stent    01/ 2006  x1 BMS to pRI (CoStar study stent);  and 09/ 2010  x1 BMS to pRCA   Status post total knee replacement, 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    Per H&P on 04/22/2024 by [facility] MD, patient presented after a couple days of N/V/D with decreased PO intake. Patient reported dizziness and lightheadedness resulting in a fall with head strike but no loss of consciousness.    Per GI progress note on 04/28/2024 by Nandigam MD interventions for acute colonic pseudo-obstruction limited by tenuous cardiac status without any current plans for repeat colonoscopy for decompression.    CT head on 04/21/24 negative for acute intracranial abnormality. CT abdomen on 11/28 demonstrated mild dilatation of colon with air-fluid levels with no transition point and distended loops and stomach. CT Angio Chest PE on 04/28/2024 negative for PE but increased bilateral GGO. TEE on 02/21/2024 showed EF 20-25%, LV severely decreased function and global hypokinesis, RV function moderately reduced.    Palliative medicine consulted for goals of care conversation.   SUMMARY OF RECOMMENDATIONS DNR-limited Full scope Continue current interventions with goal of rehab PMT will continue to follow and monitor for outcomes  Symptom Management:  Per primary team  Code Status: DNR - Limited (DNR/DNI)  Prognosis: Unable to determine  Discharge Planning: To Be Determined  Discussed with: Lue MD 05/08/2024 about patient's continued desire and slight progress towards rehab goals.    Subjective:   Subjective: Chart Reviewed. Updates received. Patient Assessed. Created space and opportunity for patient  and  family to explore thoughts and feelings regarding current medical situation.  Today's Discussion:  Met with patient at bedside without any visitors. Patient awake and alert. Remains pleasant and motivated to continue to progress towards his goal of rehab. Patient states that he is willing to go to a SNF for rehab for a short period of time when previously he was hesitant and preferred to pursue home with rehab. Patient denies any discomfort and continues  to have bowel movements.  Review of Systems  All other systems reviewed and are negative.   Objective:   Primary Diagnoses: Present on Admission:  Enterocolitis  Hypokalemia  Acute kidney injury  Elevated troponin  Hypotension  Chronic systolic CHF (congestive heart failure) (HCC)  Paroxysmal atrial flutter (HCC)  HLD (hyperlipidemia)  Anemia of chronic disease  Ogilvie syndrome  Ileus, unspecified (HCC)   Vital Signs:  BP 116/65 (BP Location: Left Arm)   Pulse 80   Temp (!) 97.5 F (36.4 C) (Oral)   Resp 20   Ht 6' (1.829 m)   Wt 81.6 kg   SpO2 94%   BMI 24.40 kg/m   Physical Exam HENT:     Head: Normocephalic.     Nose: Nose normal.     Mouth/Throat:     Mouth: Mucous membranes are dry.  Eyes:     Extraocular Movements: Extraocular movements intact.  Cardiovascular:     Rate and Rhythm: Normal rate.     Pulses: Normal pulses.  Pulmonary:     Effort: Pulmonary effort is normal.  Abdominal:     General: There is distension.     Palpations: Abdomen is soft.  Skin:    General: Skin is warm.  Neurological:     General: No focal deficit present.     Mental Status: He is alert.  Psychiatric:        Mood and Affect: Mood normal.    Palliative Assessment/Data: 60%   Existing Vynca/ACP Documentation: None  Thank you for allowing us  to participate in the care of Chase Combs PMT will continue to support holistically.  I personally spent a total of 25 minutes in the care of the patient today including preparing to see the patient, performing a medically appropriate exam/evaluation, counseling and educating, referring and communicating with other health care professionals, and documenting clinical information in the EHR.  [name]  Palliative Medicine Team  Team Phone # [phone] (Nights/Weekends) 05/08/2024 12:35 PM

## 2024-05-08 NOTE — Progress Notes (Signed)
 PROGRESS NOTE    Chase Combs  FMW:[id number] DOB: 1946/03/08 DOA: 04/21/2024 PCP: Wonda Worth SQUIBB, PA   Brief Narrative:  78 y.o. 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 Of note, patient had recent complicated admission for septic shock, cardiogenic shock, intestinal pneumatosis/ileus/pseudoobstruction/Ogilvie syndrome. Now admitted for recurrent Ogilvie syndrome, suspected gastroenterocolitis, AKI on CKD, hypotension and acute debilitation.    Assessment & Plan:   Principal Problem:   Enterocolitis Active Problems:   Ogilvie syndrome   Chronic systolic CHF (congestive heart failure) (HCC)   Hypokalemia   HLD (hyperlipidemia)   Acute kidney injury   DM2 (diabetes mellitus, type 2) (HCC)   Postural dizziness with presyncope   Ileus, unspecified (HCC)   Elevated troponin   Fall at home, initial encounter   Hypotension   Paroxysmal atrial flutter (HCC)   Anemia of chronic disease   Adynamic ileus (HCC)   Chronic combined systolic and diastolic congestive heart failure (HCC)  Diffuse progressive ileus Recurrent Ogilvie Syndrome/pseudoobstruction Severe protein caloric malnutrition Profound diffuse diarrhea History of recent hospitalization (01/22/24-02/25/24) for similar GI symptoms with extensive workup. Underwent colonic decompression (02/18/24) with improvement Currently afebrile, with no leukocytosis C diff and enteric PCR panel negative CT A/P w/o contrast with noted  diffuse mild dilatation Repeat abdominal x-ray on 12/3 with diffuse ileus involving colon and small bowel GI consulted, no plan for intervention at this time given elevated risk of complications given sedation requirements Diarrhea markedly improving over the past 48h after decreased dose of MiraLAX , rectal tube removed over the weekend - continues to improve.  Acute hypoxic respiratory failure, resolved Currently on room air at rest D-dimer mildly elevated Procalcitonin 0.26 Likely in the setting of abdominal distension/limited pulmonary excursion from underlying ileus/pseudoobstruction/gastroenterocolitis  CTA chest negative for PE PCCM consulted - completed 5 days of IV antibiotics, continue supportive care  - DuoNebs, nebulizers, Mucomyst    CHFrEF Ischemic cardiomyopathy Recent prolonged hospitalization complicated by cardiogenic shock with acute CHF exacerbation in the setting of LAD stent stenosis  Last echo 01/2024 with LVEF 20-25% CXR 12/2 showed bilateral opacities Cardiology consulted, appreciate recs - continue lasix , spironolactone , continue to hold entresto , digoxin , Jardiance    Persistent Afib/flutter with RVR Heart rate improved, now back in sinus rhythm as of 12/7 S/p TEE CV on 02/21/2024 Cardiology on board, appreciate recs - continue amiodarone , IV heparin  (Eliquis  held given above)   Gross hematuria Resolved Noted hematuria on 12/4 in the setting of anticoagulation Renal ultrasound showed enlarged prostate protruding into the inferior bladder, layering debris's in the bladder could reflect blood clot Urology consulted, placed foley on 12/5 Continue flomax    CAD s/p DES to LAD 8/25 Hyperlipidemia Recent balloon angioplasty of LAD stent thrombosis (02/01/2024) due to inability to tolerate oral DAPT  History of remote PCI to RI and RCA EKG with known RBBB, LAFB, no significant changes from prior Troponin 67 < 71 Cardiology consulted - cont home plavix , held Eliquis ,  continue Crestor    Hypokalemia/hypomagnesemia Likely in the setting of ileus, frequent diarrhea - replete as appropriate   AKI on CKD stage II  Cr 1.11 < 2.1, baseline 0.9-1.5 Likely in the setting of dehydration from diarrhea   Peptic ulcer disease Recent hospitalization with EGD (02/02/24) showing esophageal ulcer without bleeding, grade C reflux esophagitis without bleeding, duodenal erosions Cont pantoprazole    T2DM Hold home Jardiance  Cont SSI   Incidental R thyroid  nodule Noted on CT C spine TSH normal  - recommend outpatient thyroid  US    Chronic normocytic anemia - stable   Acute ambulatory dysfunction Mechanical fall PT/OT following- rec SNF given profound weakness from baseline   GOC discussion Patient with poor prognosis, recurrent severe ileus, advanced CHF Palliative care consulted, appreciate recs  DVT prophylaxis: SCDs Start: 04/22/24 0137 Code Status:   Code Status: Limited: Do not attempt resuscitation (DNR) -DNR-LIMITED -Do Not Intubate/DNI  Family Communication: None present  Status is: Inpatient  Dispo: The patient is from: Home              Anticipated d/c is to: SNF              Anticipated d/c date is: To be determined              Patient currently IS medically stable for discharge  Consultants:  Palliative care, cardiology, pulmonology, GI, urology   Subjective: No acute issues or events overnight denies headache fevers chills chest pain shortness of breath nausea vomiting - diarrhea markedly improving - reports soft BM now  Objective: Vitals:   05/08/24 0020 05/08/24 0034 05/08/24 0333 05/08/24 0754  BP:   125/65 109/75  Pulse: 73 74 76 75  Resp: 17 20 20 15   Temp:   98 F (36.7 C) 97.7 F (36.5 C)  TempSrc:   Oral Oral  SpO2: 90% 94% 94% 94%  Weight:   81.6 kg   Height:        Intake/Output Summary (Last 24 hours) at 05/08/2024 0808 Last data filed at 05/08/2024 0100 Gross per 24 hour  Intake 784.43 ml  Output 700 ml  Net  84.43 ml   Filed Weights   05/06/24 0339 05/07/24 0400 05/08/24 0333  Weight: 87.1 kg 82.8 kg 81.6 kg    Examination:  General:  Pleasantly resting in bed, No acute distress. HEENT:  Normocephalic atraumatic.  Sclerae nonicteric, noninjected.  Extraocular movements intact bilaterally. Neck:  Without mass or deformity.  Trachea is midline. Lungs:  Clear to auscultate bilaterally without rhonchi, wheeze, or rales. Heart:  Regular rate and rhythm.  Without murmurs, rubs, or gallops. Abdomen: Distended, nontympanic, rectal tube noted with liquid retained stool   Data Reviewed: I have personally reviewed following labs and imaging studies  CBC: Recent Labs  Lab 05/02/24 0408 05/03/24 0314 05/06/24 0610  WBC 5.8 4.7 3.9*  NEUTROABS 4.3 3.2  --   HGB 11.5* 10.2* 10.3*  HCT 34.9* 31.8* 31.1*  MCV 92.1 91.6 91.2  PLT 303 335 364   Basic Metabolic Panel: Recent Labs  Lab 05/01/24 1558 05/02/24 0408 05/03/24 0314 05/06/24 0610  NA 137 137 134* 136  K 3.2* 2.9* 2.8* 2.4*  CL 103 102 101 98  CO2 31 19* 27 30  GLUCOSE 115* 110* 87 100*  BUN 16 14 11 8   CREATININE 0.99 0.83 0.81 0.74  CALCIUM  7.8* 7.9* 8.0* 8.0*  MG  --  2.0  --   --    GFR: Estimated Creatinine Clearance: 86.2 mL/min (by C-G formula based on SCr of 0.74 mg/dL). Liver Function Tests: No results for input(s): AST, ALT, ALKPHOS, BILITOT, PROT, ALBUMIN in the  last 168 hours. No results for input(s): LIPASE, AMYLASE in the last 168 hours. No results for input(s): AMMONIA in the last 168 hours. Coagulation Profile: No results for input(s): INR, PROTIME in the last 168 hours. Cardiac Enzymes: No results for input(s): CKTOTAL, CKMB, CKMBINDEX, TROPONINI in the last 168 hours. BNP (last 3 results) Recent Labs    01/07/24 2301  PROBNP 15,245.0*   HbA1C: No results for input(s): HGBA1C in the last 72 hours. CBG: Recent Labs  Lab 05/07/24 1621 05/07/24 2008 05/07/24 2354  05/08/24 0426 05/08/24 0734  GLUCAP 161* 88 97 103* 103*   Lipid Profile: No results for input(s): CHOL, HDL, LDLCALC, TRIG, CHOLHDL, LDLDIRECT in the last 72 hours. Thyroid  Function Tests: No results for input(s): TSH, T4TOTAL, FREET4, T3FREE, THYROIDAB in the last 72 hours. Anemia Panel: No results for input(s): VITAMINB12, FOLATE, FERRITIN, TIBC, IRON, RETICCTPCT in the last 72 hours. Sepsis Labs: No results for input(s): PROCALCITON, LATICACIDVEN in the last 168 hours.   Recent Results (from the past 240 hours)  Respiratory (~20 pathogens) panel by PCR     Status: None   Collection Time: 04/28/24  6:06 PM   Specimen: Nasopharyngeal Swab; Respiratory  Result Value Ref Range Status   Adenovirus NOT DETECTED NOT DETECTED Final   Coronavirus 229E NOT DETECTED NOT DETECTED Final    Comment: (NOTE) The Coronavirus on the Respiratory Panel, DOES NOT test for the novel  Coronavirus (2019 nCoV)    Coronavirus HKU1 NOT DETECTED NOT DETECTED Final   Coronavirus NL63 NOT DETECTED NOT DETECTED Final   Coronavirus OC43 NOT DETECTED NOT DETECTED Final   Metapneumovirus NOT DETECTED NOT DETECTED Final   Rhinovirus / Enterovirus NOT DETECTED NOT DETECTED Final   Influenza A NOT DETECTED NOT DETECTED Final   Influenza B NOT DETECTED NOT DETECTED Final   Parainfluenza Virus 1 NOT DETECTED NOT DETECTED Final   Parainfluenza Virus 2 NOT DETECTED NOT DETECTED Final   Parainfluenza Virus 3 NOT DETECTED NOT DETECTED Final   Parainfluenza Virus 4 NOT DETECTED NOT DETECTED Final   Respiratory Syncytial Virus NOT DETECTED NOT DETECTED Final   Bordetella pertussis NOT DETECTED NOT DETECTED Final   Bordetella Parapertussis NOT DETECTED NOT DETECTED Final   Chlamydophila pneumoniae NOT DETECTED NOT DETECTED Final   Mycoplasma pneumoniae NOT DETECTED NOT DETECTED Final    Comment: Performed at [facility], [address], [place], [place] [address]  MRSA  Next Gen by PCR, Nasal     Status: None   Collection Time: 04/29/24  4:31 PM   Specimen: Nasal Mucosa; Nasal Swab  Result Value Ref Range Status   MRSA by PCR Next Gen NOT DETECTED NOT DETECTED Final    Comment: (NOTE) The GeneXpert MRSA Assay (FDA approved for NASAL specimens only), is one component of a comprehensive MRSA colonization surveillance program. It is not intended to diagnose MRSA infection nor to guide or monitor treatment for MRSA infections. Test performance is not FDA approved in patients less than 59 years old. Performed at [facility], [address] [place], [place] [address]          Radiology Studies: No results found.      Scheduled Meds:  acetylcysteine   2 mL Nebulization BID   amiodarone   200 mg Oral Daily   budesonide  (PULMICORT ) nebulizer solution  0.25 mg Nebulization BID   Chlorhexidine  Gluconate Cloth  6 each Topical Daily   clopidogrel   75 mg Oral Daily   feeding supplement  1 Container Oral TID BM   fiber supplement (BANATROL TF)  60 mL Oral BID   furosemide   40 mg Oral Daily   Gerhardt's butt cream   Topical BID   insulin  aspart  0-6 Units Subcutaneous Q4H   ipratropium  0.5 mg Nebulization BID   lidocaine   1 Application Urethral Once   pantoprazole   40 mg Oral Daily   polyethylene glycol  17 g Oral Daily   revefenacin   175 mcg Nebulization Daily   rosuvastatin   20 mg Oral Daily   spironolactone   25 mg Oral Daily   tamsulosin   0.4 mg Oral QPC supper   Continuous Infusions:  heparin  1,900 Units/hr (05/07/24 2242)     LOS: 16 days   Time spent:  Elsie JAYSON Montclair, DO [facility]  If 7PM-7AM, please contact night-coverage [email or web address]  05/08/2024, 8:08 AM

## 2024-05-08 NOTE — Progress Notes (Signed)
° °  °  Subjective: NAEON. Catheter draining clear yellow urine.   Objective: Vital signs in last 24 hours: Temp:  [97.7 F (36.5 C)-98.2 F (36.8 C)] 97.7 F (36.5 C) (12/15 0754) Pulse Rate:  [73-81] 75 (12/15 0754) Resp:  [13-20] 15 (12/15 0754) BP: (100-139)/(57-75) 109/75 (12/15 0754) SpO2:  [90 %-99 %] 94 % (12/15 0754) Weight:  [81.6 kg] 81.6 kg (12/15 0333)  Assessment/Plan: 66m followed by Dr. Renda w/ BPH and hx of high risk pCa, treated with ADT 2/23. 95g prostate 2022  #BPH #Phimosis  Foley placed by Dr. Nieves 12/5 Continue Flomax  0.4mg  daily TOV this am in anticipation of discharge. Place purewick for now if pt cannot stand. If bladder scans exceed , please replace with 5f coude and discharge home.   Intake/Output from previous day: 12/14 0701 - 12/15 0700 In: 784.4 [P.O.:477; I.V.:307.4] Out: 700 [Urine:700]  Intake/Output this shift: No intake/output data recorded.  Physical Exam:  General: Alert and oriented CV: No cyanosis Lungs: equal chest rise Abdomen: Soft, NTND, no rebound or guarding Gu: foley in place draining clear yellow urine  Lab Results: Recent Labs    05/06/24 0610  HGB 10.3*  HCT 31.1*   BMET Recent Labs    05/06/24 0610  NA 136  K 2.4*  CL 98  CO2 30  GLUCOSE 100*  BUN 8  CREATININE 0.74  CALCIUM  8.0*  HGB 10.3*  WBC 3.9*     Studies/Results: No results found.    LOS: 16 days   Chase Bourdon, NP [facility] Pager: [phone]  05/08/2024, 8:47 AM

## 2024-05-08 NOTE — TOC Progression Note (Signed)
 Transition of Care [facility]) - Progression Note    Patient Details  Name: Chase Combs MRN: [id number] Date of Birth: 02/04/1946  [facility]) CM/SW Contact  Montie LOISE Louder, [place] Phone Number: 05/08/2024, 10:02 AM  Clinical Narrative:     TOC continuing to follow Pt agreeable to SNF once stable- wants [facility]    Montie Louder, MSW, LCSW Clinical Social Worker                      Expected Discharge Plan and Services                                               Social Drivers of Health (SDOH) Interventions SDOH Screenings   Food Insecurity: No Food Insecurity (04/22/2024)  Housing: Low Risk (04/22/2024)  Transportation Needs: No Transportation Needs (04/22/2024)  Utilities: Not At Risk (04/22/2024)  Social Connections: Moderately Integrated (04/24/2024)  Tobacco Use: Low Risk (04/22/2024)    Readmission Risk Interventions    01/26/2024   12:46 PM 10/21/2023   11:36 AM  Readmission Risk Prevention Plan  Transportation Screening Complete Complete  PCP or Specialist Appt within 5-7 Days  Complete  Home Care Screening  Complete  Medication Review (RN CM)  Complete  Medication Review (RN Care Manager) Referral to Pharmacy   PCP or Specialist appointment within 3-5 days of discharge Complete   HRI or Home Care Consult Complete   SW Recovery Care/Counseling Consult Complete   Palliative Care Screening Not Applicable   Skilled Nursing Facility Not Applicable

## 2024-05-08 NOTE — Progress Notes (Signed)
 Nutrition Follow-up  DOCUMENTATION CODES:   Non-severe (moderate) malnutrition in context of chronic illness  INTERVENTION:  Continue current GI soft diet as ordered Boost Breeze po TID, each supplement provides 250 kcal and 9 grams of protein Banatrol BID-provides 45kcal, 5g soluble fiber and 2g protein per serving.  NUTRITION DIAGNOSIS:   Moderate Malnutrition related to chronic illness as evidenced by moderate fat depletion, severe muscle depletion, moderate muscle depletion. - remains applicable  GOAL:   Patient will meet greater than or equal to 90% of their needs - goal likely met   MONITOR:   Diet advancement, Labs  REASON FOR ASSESSMENT:   Consult Assessment of nutrition requirement/status  ASSESSMENT:   Pt with hx of heart failure, atrial fibrillation, type 2 diabetes, and OSA. Recent admission 8/30-10/2 for colonic pseudoobstruction and underwent EGD, decompression colonoscopy, and required TPN. Admitted with suspected gastroenterocolitis after 1-2 days of n/v, and diarrhea, diagnosed AKI on CKD, and debilitation.   Medically stable for discharge per MD. TOC working on SNF placement.   Per MD, stool output improving with discontinuation of miralax . Though nursing documented multiple type 7 stools toady, pt reports more soft and formed.   Meal completions 100% x7 recorded meals (12/01-1211) and 85% breakfast on 12/13. Per MAR continues to receive Boost Breeze supplements.   Admit weight 85 kg (11/28) Current weight 81.6 kg (12/15)  Medications: lasix  40mg  daily, SSI 0-6 units q4h, miralax  daily (not given), spironolactone   Labs: reviewed CBG's 97-126 x24 hours  Diet Order:   Diet Order             DIET SOFT Room service appropriate? Yes; Fluid consistency: Thin  Diet effective now                   EDUCATION NEEDS:   Education needs have been addressed  Skin:  Skin Assessment: Reviewed RN Assessment  Last BM:  12/15 type 7 x5  Height:    Ht Readings from Last 1 Encounters:  05/05/24 6' (1.829 m)    Weight:   Wt Readings from Last 1 Encounters:  05/08/24 81.6 kg    Ideal Body Weight:  80.9 kg  BMI:  Body mass index is 24.4 kg/m.  Estimated Nutritional Needs:   Kcal:  1900-2100  Protein:  90-110 g  Fluid:  2L  Allie Jerrard Bradburn, RDN, LDN Clinical Nutrition See AMiON for contact information.

## 2024-05-08 NOTE — Plan of Care (Signed)
°  Problem: Education: Goal: Knowledge of General Education information will improve Description: Including pain rating scale, medication(s)/side effects and non-pharmacologic comfort measures 05/08/2024 1721 by Heddy Jackquelyn BIRCH, RN Outcome: Progressing 05/08/2024 1721 by Heddy Jackquelyn BIRCH, RN Outcome: Progressing   Problem: Health Behavior/Discharge Planning: Goal: Ability to manage health-related needs will improve 05/08/2024 1721 by Heddy Jackquelyn BIRCH, RN Outcome: Progressing 05/08/2024 1721 by Heddy Jackquelyn D, RN Outcome: Progressing   Problem: Clinical Measurements: Goal: Will remain free from infection Outcome: Progressing Goal: Respiratory complications will improve Outcome: Progressing   Problem: Activity: Goal: Risk for activity intolerance will decrease 05/08/2024 1721 by Heddy Jackquelyn BIRCH, RN Outcome: Progressing 05/08/2024 1721 by Heddy Jackquelyn D, RN Outcome: Progressing   Problem: Nutrition: Goal: Adequate nutrition will be maintained Outcome: Progressing   Problem: Elimination: Goal: Will not experience complications related to bowel motility Outcome: Progressing

## 2024-05-08 NOTE — Progress Notes (Signed)
 Physical Therapy Treatment Patient Details Name: Chase Combs MRN: [id number] DOB: 07-10-1945 Today's Date: 05/08/2024   History of Present Illness Pt is a 78 y.o. male admitted 11/28 with enterocolitis, presyncope, and hypotension. Multiple recent hospitalizations for cardiac and GI issues.PMH: Ogilvie syndrome s/p colonic decompression, NSTEMI, CAD, heart failure, a fib, HTN, HLD, DMII, CKD, OSA, prostate ca    PT Comments  Pt resting in bed on arrival, pleasant and agreeable to session and demonstrating continued progress towards acute goals. Pt progressing ambulation this session, performing short in room gait with RW for support and grossly CGA for safety with assist for line/lead management. Gait distance limited by fatigue and pt stated tolerance. Pt agreeable to time up in the chair at end of session and verbalizing understanding of benefits of frequent mobilization with this PTA encouraging pt to use BSC vs bedpan with staff assist to increase time OOB and progress functional transfer tolerance. Pt continues to benefit from skilled PT services to progress toward functional mobility goals.     If plan is discharge home, recommend the following: A lot of help with walking and/or transfers;A lot of help with bathing/dressing/bathroom;Assistance with cooking/housework;Assist for transportation;Help with stairs or ramp for entrance   Can travel by private vehicle     No  Equipment Recommendations  None recommended by PT    Recommendations for Other Services       Precautions / Restrictions Precautions Precautions: Fall;Other (comment) Recall of Precautions/Restrictions: Intact Precaution/Restrictions Comments: watch BP Restrictions Weight Bearing Restrictions Per Provider Order: No     Mobility  Bed Mobility Overal bed mobility: Needs Assistance Bed Mobility: Supine to Sit     Supine to sit: Contact guard     General bed mobility comments: CGA for safety     Transfers Overall transfer level: Needs assistance Equipment used: Rolling walker (2 wheels) Transfers: Sit to/from Stand Sit to Stand: Contact guard assist           General transfer comment: pt standing from EOB at lowest height and BSC with CGA for safety. good recall for hand placement    Ambulation/Gait Ambulation/Gait assistance: Contact guard assist Gait Distance (Feet): 25 Feet Assistive device: Rolling walker (2 wheels) Gait Pattern/deviations: Step-through pattern Gait velocity: dec     General Gait Details: pt ambulating with slow shuffling steps, wide BOS noted, no overt LOB   Stairs             Wheelchair Mobility     Tilt Bed    Modified Rankin (Stroke Patients Only)       Balance Overall balance assessment: Needs assistance Sitting-balance support: Feet supported Sitting balance-Leahy Scale: Good Sitting balance - Comments: sits unsupported EOB   Standing balance support: Bilateral upper extremity supported Standing balance-Leahy Scale: Fair Standing balance comment: able to maintain static standing without UE support                            Communication Communication Communication: No apparent difficulties Factors Affecting Communication: Hearing impaired  Cognition Arousal: Alert Behavior During Therapy: WFL for tasks assessed/performed, Anxious                             Following commands: Impaired      Cueing Cueing Techniques: Verbal cues  Exercises      General Comments General comments (skin integrity, edema, etc.): VSS on RA  Pertinent Vitals/Pain Pain Assessment Pain Assessment: Faces Faces Pain Scale: Hurts a little bit Pain Location: generalized with mobility Pain Descriptors / Indicators: Grimacing Pain Intervention(s): Monitored during session, Limited activity within patient's tolerance    Home Living                          Prior Function            PT  Goals (current goals can now be found in the care plan section) Acute Rehab PT Goals Patient Stated Goal: get stronger and return home PT Goal Formulation: With patient Time For Goal Achievement: 05/09/24 Progress towards PT goals: Progressing toward goals    Frequency    Min 2X/week      PT Plan      Co-evaluation              AM-PAC PT 6 Clicks Mobility   Outcome Measure  Help needed turning from your back to your side while in a flat bed without using bedrails?: A Little Help needed moving from lying on your back to sitting on the side of a flat bed without using bedrails?: A Little Help needed moving to and from a bed to a chair (including a wheelchair)?: A Little Help needed standing up from a chair using your arms (e.g., wheelchair or bedside chair)?: A Little Help needed to walk in hospital room?: A Lot (<40') Help needed climbing 3-5 steps with a railing? : Total 6 Click Score: 15    End of Session   Activity Tolerance: Patient limited by fatigue Patient left: with call bell/phone within reach;in chair Nurse Communication: Mobility status PT Visit Diagnosis: Other abnormalities of gait and mobility (R26.89);Muscle weakness (generalized) (M62.81)     Time: 8561-8498 PT Time Calculation (min) (ACUTE ONLY): 23 min  Charges:    $Gait Training: 8-22 mins $Therapeutic Activity: 8-22 mins PT General Charges $$ ACUTE PT VISIT: 1 Visit                     Chase Combs R. PTA Acute Rehabilitation Services Office: [phone]   Chase Combs 05/08/2024, 3:05 PM

## 2024-05-09 DIAGNOSIS — E440 Moderate protein-calorie malnutrition: Secondary | ICD-10-CM | POA: Insufficient documentation

## 2024-05-09 LAB — BASIC METABOLIC PANEL WITH GFR
Anion gap: 6 (ref 5–15)
BUN: 7 mg/dL — ABNORMAL LOW (ref 8–23)
CO2: 30 mmol/L (ref 22–32)
Calcium: 8 mg/dL — ABNORMAL LOW (ref 8.9–10.3)
Chloride: 103 mmol/L (ref 98–111)
Creatinine, Ser: 0.62 mg/dL (ref 0.61–1.24)
GFR, Estimated: >60 mL/min (ref >60)
Glucose, Bld: 101 mg/dL — ABNORMAL HIGH (ref 70–99)
Potassium: 2.4 mmol/L — CL (ref 3.5–5.1)
Sodium: 139 mmol/L (ref 135–145)

## 2024-05-09 LAB — CBC
HCT: 34.3 % — ABNORMAL LOW (ref 39.0–52.0)
Hemoglobin: 11.4 g/dL — ABNORMAL LOW (ref 13.0–17.0)
MCH: 30.3 pg (ref 26.0–34.0)
MCHC: 33.2 g/dL (ref 30.0–36.0)
MCV: 91.2 fL (ref 80.0–100.0)
Platelets: 428 K/uL — ABNORMAL HIGH (ref 150–400)
RBC: 3.76 MIL/uL — ABNORMAL LOW (ref 4.22–5.81)
RDW: 18.3 % — ABNORMAL HIGH (ref 11.5–15.5)
WBC: 4.3 K/uL (ref 4.0–10.5)
nRBC: 0 % (ref 0.0–0.2)

## 2024-05-09 LAB — GLUCOSE, CAPILLARY
Glucose-Capillary: 92 mg/dL (ref 70–99)
Glucose-Capillary: 97 mg/dL (ref 70–99)
Glucose-Capillary: 99 mg/dL (ref 70–99)

## 2024-05-09 LAB — HEPARIN LEVEL (UNFRACTIONATED): Heparin Unfractionated: 0.44 [IU]/mL (ref 0.30–0.70)

## 2024-05-09 MED ORDER — POTASSIUM CHLORIDE CRYS ER 20 MEQ PO TBCR
40.0000 meq | EXTENDED_RELEASE_TABLET | ORAL | Status: AC
  Administered 2024-05-09 (×3): 40 meq via ORAL
  Filled 2024-05-09 (×3): qty 2

## 2024-05-09 NOTE — Progress Notes (Signed)
 PHARMACY - ANTICOAGULATION CONSULT NOTE  Pharmacy Consult for Heparin  (holding Apixaban ) Indication: atrial fibrillation  Allergies  Allergen Reactions   Codeine Nausea And Vomiting   Latex Rash    Oral blisters when used at dentist    Patient Measurements: Height: 6' (182.9 cm) Weight: 81.6 kg (179 lb 14.3 oz) IBW/kg (Calculated) : 77.6 HEPARIN  DW (KG): 84.3  Vital Signs: Temp: 97.8 F (36.6 C) (12/16 0418) Temp Source: Oral (12/16 0418) BP: 105/67 (12/16 0418) Pulse Rate: 76 (12/16 0418)  Labs: Recent Labs    05/07/24 1613 05/08/24 0355 05/09/24 0611  HGB  --   --  11.4*  HCT  --   --  34.3*  PLT  --   --  428*  HEPARINUNFRC 0.67 0.75* 0.44  CREATININE  --   --  0.62    Estimated Creatinine Clearance: 86.2 mL/min (by C-G formula based on SCr of 0.62 mg/dL).  Assessment: 78 y/o M on with increasingly distended abdomen > ileus, on apixaban  PTA for afib, holding apixaban  and starting heparin  in case any procedures are needed, last dose of apixaban  was 12/2 around 1930.   Noted hematuria 12/4 now resolved.  12/16: Heparin  level therapeutic at 0.44 on heparin  at 1750 units/hr.  Goal of Therapy:  Heparin  level 0.3-0.7 units/ml Monitor platelets by anticoagulation protocol: Yes   Plan:  Continue heparin  gtt at 1750 units/hour  Daily heparin  levels  CBC Q72H  Monitor s/s bleeding  Follow-up restart of apixaban    [employer], Pharm.D., BCPS Clinical Pharmacist Clinical phone for 05/09/2024 from 7:30-3:00 is [phone].  **Pharmacist phone directory can be found on [email or web address] listed under [facility].  05/09/2024 8:06 AM

## 2024-05-09 NOTE — Progress Notes (Signed)
 Notified by CCMD and verified with MDs of discontinuation of tele.

## 2024-05-09 NOTE — Progress Notes (Signed)
° °  °  Subjective: NAEON. Voiding without difficulty.   Objective: Vital signs in last 24 hours: Temp:  [97.5 F (36.4 C)-97.8 F (36.6 C)] 97.8 F (36.6 C) (12/16 0418) Pulse Rate:  [63-80] 76 (12/16 0418) Resp:  [16-20] 17 (12/16 0418) BP: (101-116)/(61-67) 105/67 (12/16 0418) SpO2:  [94 %-96 %] 96 % (12/16 0418) Weight:  [81.6 kg] 81.6 kg (12/16 0500)  Assessment/Plan: 35m followed by Dr. Renda w/ BPH and hx of high risk pCa, treated with ADT 2/23. 95g prostate 2022  #BPH #Phimosis  Foley placed by Dr. Nieves 12/5 Continue Flomax  0.4mg  daily Voiding trial yesterday. Voiding without difficulty. Bladder scans not collected. Nursing to get PVR and contact Urology on call. Replace foley if PVR > 500 mL with 33f coude.  Urology will sign off at this time. Please call with questions.  Intake/Output from previous day: 12/15 0701 - 12/16 0700 In: 395.9 [I.V.:395.9] Out: 800 [Urine:800]  Intake/Output this shift: No intake/output data recorded.  Physical Exam:  General: Alert and oriented CV: No cyanosis Lungs: equal chest rise Abdomen: Soft, NTND, no rebound or guarding Gu: foley removed, clear yellow urine from purewick  Lab Results: Recent Labs    05/09/24 0611  HGB 11.4*  HCT 34.3*   BMET Recent Labs    05/09/24 0611  NA 139  K 2.4*  CL 103  CO2 30  GLUCOSE 101*  BUN 7*  CREATININE 0.62  CALCIUM  8.0*  HGB 11.4*  WBC 4.3     Studies/Results: No results found.    LOS: 17 days   Chase Bourdon, NP [facility] Pager: [phone]  05/09/2024, 11:35 AM

## 2024-05-09 NOTE — Plan of Care (Signed)

## 2024-05-09 NOTE — Progress Notes (Signed)
 Occupational Therapy Treatment Patient Details Name: CARROLL LINGELBACH MRN: [id number] DOB: Oct 03, 1945 Today's Date: 05/09/2024   History of present illness Pt is a 78 y.o. male admitted 11/28 with enterocolitis, presyncope, and hypotension. Multiple recent hospitalizations for cardiac and GI issues.PMH: Ogilvie syndrome s/p colonic decompression, NSTEMI, CAD, heart failure, a fib, HTN, HLD, DMII, CKD, OSA, prostate ca   OT comments  Pt making progress with functional goals. Pt sat EOB with min A to elevate trunk with increased time. Pt reports dizziness once seated EOB but subsided within ~2 minutes. Pt participated in grooming/hygiene tasks with set up, UB selfcare min A, LB selfcare mod/min A  leaning side to side, STS to RW CGA to walk to [facility] for toileting tasks mod A to manage clothing and max A with posterior hygiene standing at [facility].. CGA to walk to recliner for transfer CGA with cues for hand placement. Pt with no c/o of dizziness during standing tasks. OT will continue to follow acutely to maximize level of function and safety      If plan is discharge home, recommend the following:  A lot of help with bathing/dressing/bathroom;Assistance with cooking/housework;Direct supervision/assist for medications management;Direct supervision/assist for financial management;Help with stairs or ramp for entrance;Assist for transportation;A little help with walking and/or transfers   Equipment Recommendations  Other (comment) (defer)    Recommendations for Other Services      Precautions / Restrictions Precautions Precautions: Fall;Other (comment) Recall of Precautions/Restrictions: Intact Precaution/Restrictions Comments: watch BP Restrictions Weight Bearing Restrictions Per Provider Order: No       Mobility Bed Mobility Overal bed mobility: Needs Assistance Bed Mobility: Supine to Sit     Supine to sit: Min assist, HOB elevated     General bed mobility comments: min A to elevate  trunk pulling up with therapist's hand. Pt reports dizziness once seated EOB, subsided within 2 minutes    Transfers Overall transfer level: Needs assistance Equipment used: Rolling walker (2 wheels) Transfers: Sit to/from Stand, Bed to chair/wheelchair/BSC Sit to Stand: Contact guard assist           General transfer comment: no c/o dizziness with STS and mobility to [facility] with RW     Balance Overall balance assessment: Needs assistance Sitting-balance support: Feet supported Sitting balance-Leahy Scale: Good     Standing balance support: Bilateral upper extremity supported, During functional activity Standing balance-Leahy Scale: Fair                             ADL either performed or assessed with clinical judgement   ADL Overall ADL's : Needs assistance/impaired     Grooming: Wash/dry hands;Wash/dry face;Set up;Sitting           Upper Body Dressing : Set up;Supervision/safety;Sitting   Lower Body Dressing: Minimal assistance Lower Body Dressing Details (indicate cue type and reason): socks Toilet Transfer: Contact guard assist;Rolling walker (2 wheels);Ambulation;BSC/3in1;Cueing for safety   Toileting- Clothing Manipulation and Hygiene: Moderate assistance;Maximal assistance;Sit to/from stand Toileting - Clothing Manipulation Details (indicate cue type and reason): mod A clothing mgt, max A posterior hygiene     Functional mobility during ADLs: Contact guard assist;Rolling walker (2 wheels);Cueing for safety      Extremity/Trunk Assessment Upper Extremity Assessment Upper Extremity Assessment: Generalized weakness;Right hand dominant;RUE deficits/detail RUE Deficits / Details: Pt with R shoulder impairments in FF with ~60-75 degrees AROM from previous shoulder injury per pt   Lower Extremity Assessment Lower Extremity Assessment: Defer  to PT evaluation        Vision Ability to See in Adequate Light: 0 Adequate Patient Visual Report: No  change from baseline     Perception     Praxis     Communication Communication Communication: No apparent difficulties Factors Affecting Communication: Hearing impaired   Cognition Arousal: Alert Behavior During Therapy: WFL for tasks assessed/performed, Anxious                                 Following commands: Impaired Following commands impaired: Follows one step commands with increased time      Cueing   Cueing Techniques: Verbal cues  Exercises      Shoulder Instructions       General Comments      Pertinent Vitals/ Pain       Pain Assessment Pain Assessment: Faces Faces Pain Scale: Hurts a little bit Pain Location: generalized with mobility Pain Descriptors / Indicators: Grimacing Pain Intervention(s): Monitored during session, Repositioned  Home Living                                          Prior Functioning/Environment              Frequency  Min 2X/week        Progress Toward Goals  OT Goals(current goals can now be found in the care plan section)  Progress towards OT goals: Progressing toward goals     Plan      Co-evaluation                 AM-PAC OT 6 Clicks Daily Activity     Outcome Measure   Help from another person eating meals?: None Help from another person taking care of personal grooming?: A Little Help from another person toileting, which includes using toliet, bedpan, or urinal?: A Lot Help from another person bathing (including washing, rinsing, drying)?: A Lot Help from another person to put on and taking off regular upper body clothing?: A Little Help from another person to put on and taking off regular lower body clothing?: A Lot 6 Click Score: 16    End of Session Equipment Utilized During Treatment: Gait belt;Rolling walker (2 wheels)  OT Visit Diagnosis: Other abnormalities of gait and mobility (R26.89);Muscle weakness (generalized) (M62.81)   Activity Tolerance  Patient tolerated treatment well   Patient Left with family/visitor present;in chair;with call bell/phone within reach   Nurse Communication Mobility status        Time: 1128-1203 OT Time Calculation (min): 35 min  Charges: OT General Charges $OT Visit: 1 Visit OT Treatments $Self Care/Home Management : 8-22 mins $Therapeutic Activity: 8-22 mins    Jacques Karna Loose 05/09/2024, 1:57 PM

## 2024-05-09 NOTE — Progress Notes (Signed)
 Daily Progress Note   Date: 05/09/2024   Patient Name: Chase Combs  DOB: 11/16/1945  MRN: [id number]  Age / Sex: 78 y.o., male  Attending Physician: Lue Elsie BROCKS, MD Primary Care Physician: Wonda Worth SQUIBB, PA Admit Date: 04/21/2024 Length of Stay: 17 days  Reason for Follow-up: Establishing goals of care  Past Medical History:  Diagnosis Date   Abdominal hernia    per pt   Arthritis    hands   Benign localized prostatic hyperplasia with lower urinary tract symptoms (LUTS)    CAD (coronary artery disease) 05/2004   cardiologist--- dr anner;  positive stress test , had outpt cardiac cath 05-27-2004 stenosis RI;  06-18-2004 cath w/ PCI and BMS x1 to pRI;   STEMI 02-16-2009  s/p cath w/ PCI thrombectomy for total occluded RCA , BMS x1 to pRCA;  nuclear stress test 03-15-2012 low risk no ischemia but sig inferior-inferolateral infarct , ef 51%   Dyslipidemia    Essential hypertension    Heart murmur    History of COVID-19 05/2020   per pt mild symptoms that resolved   History of ST elevation myocardial infarction (STEMI) 02/16/2009   inferior STEMI   cath w/ pci w/ BMS to pRCA   History of urinary retention    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   urologist--- dr borden/ radiation oncology-- dr patrcia;  dx 12/ 2022,  Gleason 4+5, PSA 24.6, volume 94.6cc;  plan to start IMRT   OSA (obstructive sleep apnea)    per pt dx approx 2008, no cpap intolerant   PONV (postoperative nausea and vomiting)    Pre-diabetes    S/p bare metal coronary artery stent    01/ 2006  x1 BMS to pRI (CoStar study stent);  and 09/ 2010  x1 BMS to pRCA   Status post total knee replacement, 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    Per H&P on 04/22/2024 by [facility] MD, patient presented after a couple days of N/V/D with decreased PO intake. Patient reported dizziness and lightheadedness resulting in a fall with head strike but no loss of consciousness.    Per GI progress note on 04/28/2024 by Nandigam MD interventions for acute colonic pseudo-obstruction limited by tenuous cardiac status without any current plans for repeat colonoscopy for decompression.    CT head on 04/21/24 negative for acute intracranial abnormality. CT abdomen on 11/28 demonstrated mild dilatation of colon with air-fluid levels with no transition point and distended loops and stomach. CT Angio Chest PE on 04/28/2024 negative for PE but increased bilateral GGO. TEE on 02/21/2024 showed EF 20-25%, LV severely decreased function and global hypokinesis, RV function moderately reduced.    Palliative medicine consulted for goals of care conversation.    SUMMARY OF RECOMMENDATIONS DNR-limited Full scope Continue current interventions with goal of rehab PMT will continue to follow and monitor for outcomes  Symptom Management:  Per primary team  Code Status: DNR - Limited (DNR/DNI)  Prognosis: Unable to determine  Discharge Planning: To Be Determined  Discussed with: Lue MD 05/09/2024 about my discussion with patient and his continued hopes of pursuing rehab and not being ready to discuss further the possibility if things deteriorate in the future.   Subjective:  Subjective: Chart Reviewed. Updates received. Patient Assessed. Created space and opportunity for patient  and family to explore thoughts and feelings regarding current medical situation.  Today's Discussion:  Met with patient and wife at the bedside. Patient continues to share that he feels better every day. Remains highly motivated to pursue rehab and anxious about getting a bed at his chosen facility. Patient requested an update about his placement and I shared that I  will reach out to [facility] to request an update. Patient and wife are not ready for further discussions on the possibility of a deterioration in the future. Educated patient and wife that his heart failure is progressive and will continue to deteriorate.   Review of Systems  Constitutional:  Positive for fatigue.  Gastrointestinal:  Positive for abdominal distention.    Objective:   Primary Diagnoses: Present on Admission:  Enterocolitis  Hypokalemia  Acute kidney injury  Elevated troponin  Hypotension  Chronic systolic CHF (congestive heart failure) (HCC)  Paroxysmal atrial flutter (HCC)  HLD (hyperlipidemia)  Anemia of chronic disease  Ogilvie syndrome  Ileus, unspecified (HCC)  Protein-calorie malnutrition, severe   Vital Signs:  BP (!) 108/53   Pulse 69   Temp 97.6 F (36.4 C) (Oral)   Resp 20   Ht 6' (1.829 m)   Wt 81.6 kg   SpO2 97%   BMI 24.40 kg/m   Physical Exam Constitutional:      Appearance: He is ill-appearing.  HENT:     Head: Normocephalic and atraumatic.     Mouth/Throat:     Mouth: Mucous membranes are dry.  Eyes:     Extraocular Movements: Extraocular movements intact.  Cardiovascular:     Rate and Rhythm: Normal rate.     Pulses: Normal pulses.  Pulmonary:     Effort: Pulmonary effort is normal.  Abdominal:     General: There is distension.     Palpations: Abdomen is soft.  Skin:    General: Skin is warm.  Neurological:     General: No focal deficit present.     Mental Status: He is alert.  Psychiatric:     Comments: Anxious and looking forward to discharge.     Palliative Assessment/Data: 70%   Existing Vynca/ACP Documentation: DNR order  Thank you for allowing us  to participate in the care of Chase Combs PMT will continue to support holistically.  I personally spent a total of 35 minutes in the care of the patient today including preparing to see the patient, performing a medically appropriate exam/evaluation, counseling and  educating, referring and communicating with other health care professionals, and documenting clinical information in the EHR.  [name]  Palliative Medicine Team  Team Phone # [phone] (Nights/Weekends) 05/09/2024 4:46 PM

## 2024-05-09 NOTE — Progress Notes (Signed)
 PROGRESS NOTE    Chase Combs  FMW:[id number] DOB: 01-Jan-1946 DOA: 04/21/2024 PCP: Wonda Worth SQUIBB, PA   Brief Narrative:  78 y.o. 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 Of note, patient had recent complicated admission for septic shock, cardiogenic shock, intestinal pneumatosis/ileus/pseudoobstruction/Ogilvie syndrome. Now admitted for recurrent Ogilvie syndrome, suspected gastroenterocolitis, AKI on CKD, hypotension and acute debilitation.    Assessment & Plan:   Principal Problem:   Enterocolitis Active Problems:   Ogilvie syndrome   Chronic systolic CHF (congestive heart failure) (HCC)   Hypokalemia   HLD (hyperlipidemia)   Acute kidney injury   Protein-calorie malnutrition, severe   DM2 (diabetes mellitus, type 2) (HCC)   Postural dizziness with presyncope   Ileus, unspecified (HCC)   Elevated troponin   Fall at home, initial encounter   Hypotension   Paroxysmal atrial flutter (HCC)   Anemia of chronic disease   Adynamic ileus (HCC)   Chronic combined systolic and diastolic congestive heart failure (HCC)  Diffuse progressive ileus Recurrent Ogilvie Syndrome/pseudoobstruction Severe protein caloric malnutrition Profound diffuse diarrhea History of recent hospitalization (01/22/24-02/25/24) for similar GI symptoms with extensive workup. Underwent colonic decompression (02/18/24) with improvement Currently afebrile, with no leukocytosis C diff and enteric PCR panel  negative CT A/P w/o contrast with noted diffuse mild dilatation Repeat abdominal x-ray on 12/3 with diffuse ileus involving colon and small bowel GI consulted, no plan for intervention at this time given elevated risk of complications given sedation requirements Diarrhea markedly improving over the past 48h after decreased dose of MiraLAX , rectal tube removed over the weekend - continues to improve.  Acute hypoxic respiratory failure, resolved Currently on room air at rest D-dimer mildly elevated Procalcitonin 0.26 Likely in the setting of abdominal distension/limited pulmonary excursion from underlying ileus/pseudoobstruction/gastroenterocolitis  CTA chest negative for PE PCCM consulted - completed 5 days of IV antibiotics, continue supportive care  - DuoNebs, nebulizers, Mucomyst    CHFrEF Ischemic cardiomyopathy Recent prolonged hospitalization complicated by cardiogenic shock with acute CHF exacerbation in the setting of LAD stent stenosis  Last echo 01/2024 with LVEF 20-25% CXR 12/2 showed bilateral opacities Cardiology consulted, appreciate recs - continue lasix , spironolactone , continue to hold entresto , digoxin , Jardiance    Persistent Afib/flutter with RVR Heart rate improved, now back in sinus rhythm as of 12/7 S/p TEE CV on 02/21/2024 Cardiology on board, appreciate recs - continue amiodarone , IV heparin  (Eliquis  held given above)   Gross hematuria Resolved Noted hematuria on 12/4 in the setting of anticoagulation Renal ultrasound showed enlarged prostate protruding into the inferior bladder, layering debris's in the bladder could reflect blood clot Urology consulted, placed foley on 12/5 Continue flomax    CAD s/p DES to LAD 8/25 Hyperlipidemia Recent balloon angioplasty of LAD stent thrombosis (02/01/2024) due to inability to tolerate oral DAPT  History of remote PCI to RI and RCA EKG with known RBBB, LAFB, no significant changes from prior Troponin 67 < 71 Cardiology  consulted - cont home plavix , held Eliquis , continue Crestor    Hypokalemia/hypomagnesemia Likely in the setting of loose stool - continue to follow - replete as  appropriate - may benefit from labs 1 week post discharge to ensure resolution   AKI on CKD stage II, resolved Cr 1.11 < 2.1, baseline 0.9-1.5 Likely in the setting of dehydration from diarrhea   Peptic ulcer disease Recent hospitalization with EGD (02/02/24) showing esophageal ulcer without bleeding, grade C reflux esophagitis without bleeding, duodenal erosions Cont pantoprazole    T2DM Hold home Jardiance  Cont SSI   Incidental R thyroid  nodule Noted on CT C spine TSH normal  - recommend outpatient thyroid  US    Chronic normocytic anemia - stable   Acute ambulatory dysfunction Mechanical fall PT/OT following- rec SNF given profound weakness from baseline   GOC discussion Patient with poor prognosis, recurrent severe ileus, advanced CHF Palliative care consulted, appreciate recs  DVT prophylaxis: SCDs Start: 04/22/24 0137 Code Status:   Code Status: Limited: Do not attempt resuscitation (DNR) -DNR-LIMITED -Do Not Intubate/DNI  Family Communication: None present  Status is: Inpatient  Dispo: The patient is from: Home              Anticipated d/c is to: SNF              Anticipated d/c date is: To be determined              Patient currently IS medically stable for discharge  Consultants:  Palliative care, cardiology, pulmonology, GI, urology   Subjective: No acute issues or events overnight denies headache fevers chills chest pain shortness of breath nausea vomiting - diarrhea markedly improving - reports soft BM now  Objective: Vitals:   05/08/24 1944 05/08/24 2328 05/09/24 0418 05/09/24 0500  BP: 103/61 113/65 105/67   Pulse: 63 73 76   Resp: 18 18 17    Temp: (!) 97.5 F (36.4 C) 97.6 F (36.4 C) 97.8 F (36.6 C)   TempSrc: Oral Oral Oral   SpO2: 95% 96% 96%   Weight:    81.6 kg  Height:         Intake/Output Summary (Last 24 hours) at 05/09/2024 0722 Last data filed at 05/09/2024 9385 Gross per 24 hour  Intake 395.92 ml  Output 800 ml  Net -404.08 ml   Filed Weights   05/07/24 0400 05/08/24 0333 05/09/24 0500  Weight: 82.8 kg 81.6 kg 81.6 kg    Examination:  General:  Pleasantly resting in bed, No acute distress. HEENT:  Normocephalic atraumatic.  Sclerae nonicteric, noninjected.  Extraocular movements intact bilaterally. Neck:  Without mass or deformity.  Trachea is midline. Lungs:  Clear to auscultate bilaterally without rhonchi, wheeze, or rales. Heart:  Regular rate and rhythm.  Without murmurs, rubs, or gallops. Abdomen: Distended, nontympanic, rectal tube noted with liquid retained stool   Data Reviewed: I have personally reviewed following labs and imaging studies  CBC: Recent Labs  Lab 05/03/24 0314 05/06/24 0610 05/09/24 0611  WBC 4.7 3.9* 4.3  NEUTROABS 3.2  --   --   HGB 10.2* 10.3* 11.4*  HCT 31.8* 31.1* 34.3*  MCV 91.6 91.2 91.2  PLT 335 364 428*   Basic Metabolic Panel: Recent Labs  Lab 05/03/24 0314 05/06/24 0610 05/09/24 0611  NA 134* 136 139  K 2.8* 2.4* 2.4*  CL 101 98 103  CO2 27 30 30   GLUCOSE 87 100* 101*  BUN 11 8 7*  CREATININE 0.81 0.74 0.62  CALCIUM  8.0* 8.0* 8.0*   GFR: Estimated Creatinine Clearance: 86.2 mL/min (by C-G formula based on SCr of 0.62 mg/dL). Liver Function Tests: No results for input(s): AST, ALT, ALKPHOS,  BILITOT, PROT, ALBUMIN in the last 168 hours. No results for input(s): LIPASE, AMYLASE in the last 168 hours. No results for input(s): AMMONIA in the last 168 hours. Coagulation Profile: No results for input(s): INR, PROTIME in the last 168 hours. Cardiac Enzymes: No results for input(s): CKTOTAL, CKMB, CKMBINDEX, TROPONINI in the last 168 hours. BNP (last 3 results) Recent Labs    01/07/24 2301  PROBNP 15,245.0*   HbA1C: No results for input(s): HGBA1C in  the last 72 hours. CBG: Recent Labs  Lab 05/08/24 1447 05/08/24 1614 05/08/24 1945 05/08/24 2329 05/09/24 0419  GLUCAP 126* 108* 110* 120* 97   Lipid Profile: No results for input(s): CHOL, HDL, LDLCALC, TRIG, CHOLHDL, LDLDIRECT in the last 72 hours. Thyroid  Function Tests: No results for input(s): TSH, T4TOTAL, FREET4, T3FREE, THYROIDAB in the last 72 hours. Anemia Panel: No results for input(s): VITAMINB12, FOLATE, FERRITIN, TIBC, IRON, RETICCTPCT in the last 72 hours. Sepsis Labs: No results for input(s): PROCALCITON, LATICACIDVEN in the last 168 hours.   Recent Results (from the past 240 hours)  MRSA Next Gen by PCR, Nasal     Status: None   Collection Time: 04/29/24  4:31 PM   Specimen: Nasal Mucosa; Nasal Swab  Result Value Ref Range Status   MRSA by PCR Next Gen NOT DETECTED NOT DETECTED Final    Comment: (NOTE) The GeneXpert MRSA Assay (FDA approved for NASAL specimens only), is one component of a comprehensive MRSA colonization surveillance program. It is not intended to diagnose MRSA infection nor to guide or monitor treatment for MRSA infections. Test performance is not FDA approved in patients less than 83 years old. Performed at [facility], [address] [place], [place] [address]          Radiology Studies: No results found.      Scheduled Meds:  acetylcysteine   2 mL Nebulization BID   amiodarone   200 mg Oral Daily   budesonide  (PULMICORT ) nebulizer solution  0.25 mg Nebulization BID   Chlorhexidine  Gluconate Cloth  6 each Topical Daily   clopidogrel   75 mg Oral Daily   feeding supplement  1 Container Oral TID BM   fiber supplement (BANATROL TF)  60 mL Oral BID   furosemide   40 mg Oral Daily   Gerhardt's butt cream   Topical BID   insulin  aspart  0-6 Units Subcutaneous Q4H   ipratropium  0.5 mg Nebulization BID   pantoprazole   40 mg Oral Daily   polyethylene glycol  17 g Oral Daily   potassium  chloride  40 mEq Oral Q4H   revefenacin   175 mcg Nebulization Daily   rosuvastatin   20 mg Oral Daily   spironolactone   25 mg Oral Daily   tamsulosin   0.4 mg Oral QPC supper   Continuous Infusions:  heparin  1,750 Units/hr (05/09/24 0614)     LOS: 17 days   Time spent:  Elsie JAYSON Montclair, DO [facility]  If 7PM-7AM, please contact night-coverage [email or web address]  05/09/2024, 7:22 AM

## 2024-05-10 DIAGNOSIS — Z7189 Other specified counseling: Secondary | ICD-10-CM | POA: Diagnosis not present

## 2024-05-10 DIAGNOSIS — Z66 Do not resuscitate: Secondary | ICD-10-CM | POA: Diagnosis not present

## 2024-05-10 DIAGNOSIS — Z515 Encounter for palliative care: Secondary | ICD-10-CM | POA: Diagnosis not present

## 2024-05-10 LAB — BASIC METABOLIC PANEL WITH GFR
Anion gap: 10 (ref 5–15)
BUN: 9 mg/dL (ref 8–23)
CO2: 26 mmol/L (ref 22–32)
Calcium: 8.5 mg/dL — ABNORMAL LOW (ref 8.9–10.3)
Chloride: 103 mmol/L (ref 98–111)
Creatinine, Ser: 0.75 mg/dL (ref 0.61–1.24)
GFR, Estimated: >60 mL/min (ref >60)
Glucose, Bld: 88 mg/dL (ref 70–99)
Potassium: 3.2 mmol/L — ABNORMAL LOW (ref 3.5–5.1)
Sodium: 138 mmol/L (ref 135–145)

## 2024-05-10 LAB — GLUCOSE, CAPILLARY
Glucose-Capillary: 98 mg/dL (ref 70–99)
Glucose-Capillary: 96 mg/dL (ref 70–99)
Glucose-Capillary: 89 mg/dL (ref 70–99)
Glucose-Capillary: 99 mg/dL (ref 70–99)
Glucose-Capillary: 101 mg/dL — ABNORMAL HIGH (ref 70–99)

## 2024-05-10 LAB — HEPARIN LEVEL (UNFRACTIONATED): Heparin Unfractionated: 0.65 [IU]/mL (ref 0.30–0.70)

## 2024-05-10 MED ORDER — POTASSIUM CHLORIDE CRYS ER 20 MEQ PO TBCR
40.0000 meq | EXTENDED_RELEASE_TABLET | Freq: Once | ORAL | Status: AC
  Administered 2024-05-10: 18:00:00 40 meq via ORAL
  Filled 2024-05-10: qty 2

## 2024-05-10 MED ORDER — POTASSIUM CHLORIDE CRYS ER 20 MEQ PO TBCR
20.0000 meq | EXTENDED_RELEASE_TABLET | Freq: Two times a day (BID) | ORAL | Status: DC
  Administered 2024-05-10 – 2024-05-11 (×3): 20 meq via ORAL
  Filled 2024-05-10 (×3): qty 1

## 2024-05-10 NOTE — Progress Notes (Addendum)
 Physical Therapy Treatment Patient Details Name: Chase Combs MRN: [id number] DOB: 06/06/1945 Today's Date: 05/10/2024   History of Present Illness Pt is a 78 y.o. male admitted 11/28 with enterocolitis, presyncope, and hypotension. Multiple recent hospitalizations for cardiac and GI issues.PMH: Ogilvie syndrome s/p colonic decompression, NSTEMI, CAD, heart failure, a fib, HTN, HLD, DMII, CKD, OSA, prostate ca    PT Comments  Pt resting in bed on arrival, pleasant and agreeable to session with pt demonstrating good progress towards acute goals. Pt completing bed mobility with CGA for safety and requiring min A for transfers sit<>stand and for gait with RW for support, +2 chair follow provided throughout gait for safety as pt fatigues quickly. Pt returning to bed at end of session as pt stating chair is uncomfortable. Continued education on importance of frequent mobilization to maximize functional mobility gains. Patient will benefit from continued inpatient follow up therapy, <3 hours/day to address deficits and maximize functional independence and decrease caregiver burden.  Pt continues to benefit from skilled PT services to progress toward functional mobility goals.     If plan is discharge home, recommend the following: A lot of help with walking and/or transfers;A lot of help with bathing/dressing/bathroom;Assistance with cooking/housework;Assist for transportation;Help with stairs or ramp for entrance   Can travel by private vehicle     No  Equipment Recommendations  None recommended by PT    Recommendations for Other Services       Precautions / Restrictions Precautions Precautions: Fall;Other (comment) Recall of Precautions/Restrictions: Intact Precaution/Restrictions Comments: watch BP Restrictions Weight Bearing Restrictions Per Provider Order: No     Mobility  Bed Mobility Overal bed mobility: Needs Assistance Bed Mobility: Supine to Sit, Sit to Supine     Supine to  sit: HOB elevated, Contact guard Sit to supine: Contact guard assist, HOB elevated   General bed mobility comments: CGA for safety, no physical assist needed this session    Transfers Overall transfer level: Needs assistance Equipment used: Rolling walker (2 wheels) Transfers: Sit to/from Stand, Bed to chair/wheelchair/BSC Sit to Stand: Min assist           General transfer comment: light min A to steady on rise to stand    Ambulation/Gait Ambulation/Gait assistance: Contact guard assist, +2 safety/equipment, Min assist Gait Distance (Feet): 135 Feet Assistive device: Rolling walker (2 wheels) Gait Pattern/deviations: Step-through pattern Gait velocity: dec     General Gait Details: pt ambulating with slow shuffling steps, wide BOS noted, no overt LOB, +2 for chair follow for safety, min A to steady with turning   Stairs             Wheelchair Mobility     Tilt Bed    Modified Rankin (Stroke Patients Only)       Balance Overall balance assessment: Needs assistance Sitting-balance support: Feet supported Sitting balance-Leahy Scale: Good Sitting balance - Comments: sits unsupported EOB   Standing balance support: Bilateral upper extremity supported, During functional activity Standing balance-Leahy Scale: Fair Standing balance comment: able to maintain static standing without UE support                            Communication Communication Communication: No apparent difficulties  Cognition Arousal: Alert Behavior During Therapy: WFL for tasks assessed/performed, Anxious                             Following  commands: Intact      Cueing Cueing Techniques: Verbal cues  Exercises      General Comments General comments (skin integrity, edema, etc.): VSS on RA, pt spouse present and supportive      Pertinent Vitals/Pain Pain Assessment Pain Assessment: Faces Faces Pain Scale: Hurts a little bit Pain Location:  generalized with mobility Pain Descriptors / Indicators: Grimacing Pain Intervention(s): Monitored during session, Limited activity within patient's tolerance    Home Living                          Prior Function            PT Goals (current goals can now be found in the care plan section) Acute Rehab PT Goals Patient Stated Goal: get stronger and return home PT Goal Formulation: With patient Time For Goal Achievement: 05/09/24 Progress towards PT goals: Progressing toward goals    Frequency    Min 2X/week      PT Plan      Co-evaluation              AM-PAC PT 6 Clicks Mobility   Outcome Measure  Help needed turning from your back to your side while in a flat bed without using bedrails?: A Little Help needed moving from lying on your back to sitting on the side of a flat bed without using bedrails?: A Little Help needed moving to and from a bed to a chair (including a wheelchair)?: A Little Help needed standing up from a chair using your arms (e.g., wheelchair or bedside chair)?: A Little Help needed to walk in hospital room?: A Little Help needed climbing 3-5 steps with a railing? : Total 6 Click Score: 16    End of Session   Activity Tolerance: Patient tolerated treatment well Patient left: with call bell/phone within reach;in bed;with family/visitor present Nurse Communication: Mobility status PT Visit Diagnosis: Other abnormalities of gait and mobility (R26.89);Muscle weakness (generalized) (M62.81)     Time: 8575-8558 PT Time Calculation (min) (ACUTE ONLY): 17 min  Charges:    $Gait Training: 8-22 mins PT General Charges $$ ACUTE PT VISIT: 1 Visit                     Shariyah Eland R. PTA Acute Rehabilitation Services Office: [phone]   [name] 05/10/2024, 2:50 PM

## 2024-05-10 NOTE — Progress Notes (Signed)
 PROGRESS NOTE    Chase Combs  FMW:[id number] DOB: 08-Aug-1945 DOA: 04/21/2024 PCP: Wonda Worth SQUIBB, PA   Brief Narrative:  78 y.o. 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 Of note, patient had recent complicated admission for septic shock, cardiogenic shock, intestinal pneumatosis/ileus/pseudoobstruction/Ogilvie syndrome. Now admitted for recurrent Ogilvie syndrome, suspected gastroenterocolitis, AKI on CKD, hypotension and acute debilitation.   Patient medically stable for discharge at this time, awaiting safe disposition/facility acceptance.  Assessment & Plan:   Principal Problem:   Enterocolitis Active Problems:   Ogilvie syndrome   Chronic systolic CHF (congestive heart failure) (HCC)   Hypokalemia   HLD (hyperlipidemia)   Acute kidney injury   Protein-calorie malnutrition, severe   DM2 (diabetes mellitus, type 2) (HCC)   Postural dizziness with presyncope   Ileus, unspecified (HCC)   Elevated troponin   Fall at home, initial encounter   Hypotension   Paroxysmal atrial flutter (HCC)   Anemia of chronic disease   Adynamic ileus (HCC)   Chronic combined systolic and diastolic congestive heart failure (HCC)   Malnutrition of moderate degree  Diffuse progressive ileus Recurrent Ogilvie Syndrome/pseudoobstruction Severe protein caloric malnutrition Profound diffuse diarrhea History of recent hospitalization (01/22/24-02/25/24) for similar GI symptoms with extensive workup.  Underwent colonic decompression (02/18/24) with improvement Currently afebrile, with no leukocytosis C diff and enteric PCR panel negative CT A/P w/o contrast with noted diffuse mild dilatation Repeat abdominal x-ray on 12/3 with diffuse ileus involving colon and small bowel GI consulted, no plan for intervention at this time given elevated risk of complications given sedation requirements Diarrhea resolving back to baseline which is loose stool.  Acute hypoxic respiratory failure, resolved Currently on room air at rest D-dimer mildly elevated Procalcitonin 0.26 Likely in the setting of abdominal distension/limited pulmonary excursion from underlying ileus/pseudoobstruction/gastroenterocolitis  CTA chest negative for PE PCCM consulted - completed 5 days of IV antibiotics, continue supportive care  - DuoNebs, nebulizers, Mucomyst    CHFrEF Ischemic cardiomyopathy Recent prolonged hospitalization complicated by cardiogenic shock with acute CHF exacerbation in the setting of LAD stent stenosis  Last echo 01/2024 with LVEF 20-25% CXR 12/2 showed bilateral opacities Cardiology consulted, appreciate recs - continue lasix , spironolactone , continue to hold entresto , digoxin , Jardiance    Persistent Afib/flutter with RVR Heart rate improved, now back in sinus rhythm as of 12/7 S/p TEE CV on 02/21/2024 Cardiology on board, appreciate recs - continue amiodarone , IV heparin  (Eliquis  held given above)   Gross hematuria Resolved Noted hematuria on 12/4 in the setting of anticoagulation Renal ultrasound showed enlarged prostate protruding into the inferior bladder, layering debris's in the bladder could reflect blood clot Urology consulted, placed foley on 12/5 Continue flomax    CAD s/p DES to LAD 8/25 Hyperlipidemia Recent balloon angioplasty of LAD stent thrombosis (02/01/2024) due to inability to tolerate oral DAPT  History of remote PCI to RI and RCA EKG with known RBBB, LAFB, no significant  changes from prior Troponin 67 < 71 Cardiology consulted - cont home plavix , held Eliquis , continue Crestor    Hypokalemia/hypomagnesemia Likely in the setting of loose stool -resume home  potassium as below   AKI on CKD stage II, resolved Urinary obstruction, unspecified, resolved Cr 1.11 < 2.1, baseline 0.9-1.5 Likely in the setting of dehydration from diarrhea and ?obstruction - resolved with foley placement and fluids Continue Flomax  0.4mg  daily - voiding trial successful 12/15   Peptic ulcer disease Recent hospitalization with EGD (02/02/24) showing esophageal ulcer without bleeding, grade C reflux esophagitis without bleeding, duodenal erosions Continue pantoprazole    Diabetes, type II, well-controlled Jardiance  -resume at discharge A1c 5.5   Incidental R thyroid  nodule Noted on CT C spine TSH normal  - recommend outpatient thyroid  US    Chronic normocytic anemia - stable   Acute ambulatory dysfunction Mechanical fall PT/OT following- rec SNF given profound weakness from baseline   GOC discussion Patient with poor prognosis, recurrent severe ileus, advanced CHF Palliative care consulted, appreciate recs  DVT prophylaxis: SCDs Start: 04/22/24 0137 Code Status:   Code Status: Limited: Do not attempt resuscitation (DNR) -DNR-LIMITED -Do Not Intubate/DNI  Family Communication: None present  Status is: Inpatient  Dispo: The patient is from: Home              Anticipated d/c is to: SNF              Anticipated d/c date is: To be determined              Patient currently IS medically stable for discharge  Consultants:  Palliative care, cardiology, pulmonology, GI, urology   Subjective: No acute issues or events overnight denies headache fevers chills chest pain shortness of breath nausea vomiting - diarrhea improving - reports soft BM now which is his baseline  Objective: Vitals:   05/10/24 0000 05/10/24 0335 05/10/24 0400 05/10/24 0746  BP: 105/61 (!) 112/58 115/67  129/89  Pulse: 69 69 68 79  Resp: 17 18 19  (!) 21  Temp:  97.6 F (36.4 C)  (!) 97.4 F (36.3 C)  TempSrc:  Oral  Oral  SpO2: 98% 96% 94% 94%  Weight:  66 kg    Height:        Intake/Output Summary (Last 24 hours) at 05/10/2024 0753 Last data filed at 05/10/2024 0336 Gross per 24 hour  Intake 240 ml  Output 700 ml  Net -460 ml   Filed Weights   05/08/24 0333 05/09/24 0500 05/10/24 0335  Weight: 81.6 kg 81.6 kg 66 kg    Examination:  General:  Pleasantly resting in bed, No acute distress. HEENT:  Normocephalic atraumatic.  Sclerae nonicteric, noninjected.  Extraocular movements intact bilaterally. Neck:  Without mass or deformity.  Trachea is midline. Lungs:  Clear to auscultate bilaterally without rhonchi, wheeze, or rales. Heart:  Regular rate and rhythm.  Without murmurs, rubs, or gallops. Abdomen: Distended, nontympanic, rectal tube noted with liquid retained stool   Data Reviewed: I have personally reviewed following labs and imaging studies  CBC: Recent Labs  Lab 05/06/24 0610 05/09/24 0611  WBC 3.9* 4.3  HGB 10.3* 11.4*  HCT 31.1* 34.3*  MCV 91.2 91.2  PLT 364 428*   Basic Metabolic Panel: Recent Labs  Lab 05/06/24 0610 05/09/24 0611  NA 136 139  K 2.4* 2.4*  CL 98 103  CO2 30 30  GLUCOSE 100* 101*  BUN 8 7*  CREATININE 0.74 0.62  CALCIUM  8.0* 8.0*   GFR: Estimated Creatinine Clearance: 73.3 mL/min (by C-G formula based on SCr of 0.62 mg/dL). Liver Function Tests: No results for input(s): AST, ALT, ALKPHOS, BILITOT, PROT, ALBUMIN in the last 168 hours.  No results for input(s): LIPASE, AMYLASE in the last 168 hours. No results for input(s): AMMONIA in the last 168 hours. Coagulation Profile: No results for input(s): INR, PROTIME in the last 168 hours. Cardiac Enzymes: No results for input(s): CKTOTAL, CKMB, CKMBINDEX, TROPONINI in the last 168 hours. BNP (last 3 results) Recent Labs    01/07/24 2301  PROBNP  15,245.0*   HbA1C: No results for input(s): HGBA1C in the last 72 hours. CBG: Recent Labs  Lab 05/09/24 0419 05/09/24 2040 05/09/24 2352 05/10/24 0334 05/10/24 0747  GLUCAP 97 92 99 98 96   Lipid Profile: No results for input(s): CHOL, HDL, LDLCALC, TRIG, CHOLHDL, LDLDIRECT in the last 72 hours. Thyroid  Function Tests: No results for input(s): TSH, T4TOTAL, FREET4, T3FREE, THYROIDAB in the last 72 hours. Anemia Panel: No results for input(s): VITAMINB12, FOLATE, FERRITIN, TIBC, IRON, RETICCTPCT in the last 72 hours. Sepsis Labs: No results for input(s): PROCALCITON, LATICACIDVEN in the last 168 hours.   No results found for this or any previous visit (from the past 240 hours).        Radiology Studies: No results found.      Scheduled Meds:  acetylcysteine   2 mL Nebulization BID   amiodarone   200 mg Oral Daily   budesonide  (PULMICORT ) nebulizer solution  0.25 mg Nebulization BID   Chlorhexidine  Gluconate Cloth  6 each Topical Daily   clopidogrel   75 mg Oral Daily   feeding supplement  1 Container Oral TID BM   fiber supplement (BANATROL TF)  60 mL Oral BID   furosemide   40 mg Oral Daily   Gerhardt's butt cream   Topical BID   ipratropium  0.5 mg Nebulization BID   pantoprazole   40 mg Oral Daily   polyethylene glycol  17 g Oral Daily   revefenacin   175 mcg Nebulization Daily   rosuvastatin   20 mg Oral Daily   spironolactone   25 mg Oral Daily   tamsulosin   0.4 mg Oral QPC supper   Continuous Infusions:  heparin  1,750 Units/hr (05/09/24 2142)     LOS: 18 days   Time spent:  Elsie JAYSON [place], DO [facility]  If 7PM-7AM, please contact night-coverage [email or web address]  05/10/2024, 7:53 AM

## 2024-05-10 NOTE — Progress Notes (Signed)
 Did not remove telemetry monitor due to patient still be listed as progressive care. Once clarified with treatment team that order can be carried out. Will continue to monitor.

## 2024-05-10 NOTE — Progress Notes (Signed)
 PHARMACY - ANTICOAGULATION CONSULT NOTE  Pharmacy Consult for Heparin  (holding Apixaban ) Indication: atrial fibrillation  Allergies  Allergen Reactions   Codeine Nausea And Vomiting   Latex Rash    Oral blisters when used at dentist    Patient Measurements: Height: 6' (182.9 cm) Weight: 66 kg (145 lb 8.1 oz) IBW/kg (Calculated) : 77.6 HEPARIN  DW (KG): 84.3  Vital Signs: Temp: 97.4 F (36.3 C) (12/17 1147) Temp Source: Oral (12/17 1147) BP: 118/73 (12/17 1147) Pulse Rate: 72 (12/17 1147)  Labs: Recent Labs    05/08/24 0355 05/09/24 0611 05/10/24 0332  HGB  --  11.4*  --   HCT  --  34.3*  --   PLT  --  428*  --   HEPARINUNFRC 0.75* 0.44 0.65  CREATININE  --  0.62  --     Estimated Creatinine Clearance: 73.3 mL/min (by C-G formula based on SCr of 0.62 mg/dL).  Assessment: 78 y/o M on with increasingly distended abdomen > ileus, on apixaban  PTA for afib, holding apixaban  and starting heparin  in case any procedures are needed, last dose of apixaban  was 12/2 around 1930.   Heparin  level 0.65 at goal on heparin  drip 1750 uts/hr  Last CBC stable no over bleeding noted    Goal of Therapy:  Heparin  level 0.3-0.7 units/ml Monitor platelets by anticoagulation protocol: Yes   Plan:  Continue heparin  1750 units / hr Daily heparin  levels  CBC Q72H  Monitor s/s bleeding     Olam Chalk Pharm.D. CPP, BCPS Clinical Pharmacist [phone] 05/10/2024 11:52 AM   **Pharmacist phone directory can now be found on [email or web address] ([email or web address]).  Listed under [facility].

## 2024-05-10 NOTE — TOC Progression Note (Signed)
 Transition of Care [facility]) - Progression Note    Patient Details  Name: Chase Combs MRN: [id number] Date of Birth: 1945-08-12  [facility]) CM/SW Contact  Montie LOISE Louder, [place] Phone Number: 05/10/2024, 10:13 AM  Clinical Narrative:     Patient and family preferred SNF choice is [facility]- at the time when there referral was sent to [facility] , SNF reports the patient needs to be more stable. CSW resent referral to SNF for reconsider for possible placement- waiting on response.   Spoke with patient's spouse, if unable to admit to [facility], they will accept bed offer w/[facility]. CSW informed [facility]- waiting on response to confirm availability.   TOC will continue to follow and assist with discharge planning.   Montie Louder, MSW, LCSW Clinical Social Worker                      Expected Discharge Plan and Services                                               Social Drivers of Health (SDOH) Interventions SDOH Screenings   Food Insecurity: No Food Insecurity (04/22/2024)  Housing: Low Risk (04/22/2024)  Transportation Needs: No Transportation Needs (04/22/2024)  Utilities: Not At Risk (04/22/2024)  Social Connections: Moderately Integrated (04/24/2024)  Tobacco Use: Low Risk (04/22/2024)    Readmission Risk Interventions    01/26/2024   12:46 PM 10/21/2023   11:36 AM  Readmission Risk Prevention Plan  Transportation Screening Complete Complete  PCP or Specialist Appt within 5-7 Days  Complete  Home Care Screening  Complete  Medication Review (RN CM)  Complete  Medication Review (RN Care Manager) Referral to Pharmacy   PCP or Specialist appointment within 3-5 days of discharge Complete   HRI or Home Care Consult Complete   SW Recovery Care/Counseling Consult Complete   Palliative Care Screening Not Applicable   Skilled Nursing Facility Not Applicable

## 2024-05-10 NOTE — TOC Progression Note (Signed)
 Transition of Care [facility]) - Progression Note    Patient Details  Name: COREY LASKI MRN: [id number] Date of Birth: 07-24-1945  Transition of [facility]) CM/SW Contact  Montie LOISE Louder, [place] Phone Number: 05/10/2024, 2:56 PM  Clinical Narrative:    CSW spoke with patient's spouse- she accepted [facility] bed offer.    [facility] confirmed bed offer- insurance authorization pending - reference # ID [id number].   Montie Louder, MSW, LCSW Clinical Social Worker                   Expected Discharge Plan and Services                                               Social Drivers of Health (SDOH) Interventions SDOH Screenings   Food Insecurity: No Food Insecurity (04/22/2024)  Housing: Low Risk (04/22/2024)  Transportation Needs: No Transportation Needs (04/22/2024)  Utilities: Not At Risk (04/22/2024)  Social Connections: Moderately Integrated (04/24/2024)  Tobacco Use: Low Risk (04/22/2024)    Readmission Risk Interventions    01/26/2024   12:46 PM 10/21/2023   11:36 AM  Readmission Risk Prevention Plan  Transportation Screening Complete Complete  PCP or Specialist Appt within 5-7 Days  Complete  Home Care Screening  Complete  Medication Review (RN CM)  Complete  Medication Review (RN Care Manager) Referral to Pharmacy   PCP or Specialist appointment within 3-5 days of discharge Complete   HRI or Home Care Consult Complete   SW Recovery Care/Counseling Consult Complete   Palliative Care Screening Not Applicable   Skilled Nursing Facility Not Applicable

## 2024-05-11 LAB — BASIC METABOLIC PANEL WITH GFR
Anion gap: 6 (ref 5–15)
BUN: 7 mg/dL — ABNORMAL LOW (ref 8–23)
CO2: 30 mmol/L (ref 22–32)
Calcium: 8.3 mg/dL — ABNORMAL LOW (ref 8.9–10.3)
Chloride: 105 mmol/L (ref 98–111)
Creatinine, Ser: 0.74 mg/dL (ref 0.61–1.24)
GFR, Estimated: >60 mL/min (ref >60)
Glucose, Bld: 103 mg/dL — ABNORMAL HIGH (ref 70–99)
Potassium: 3 mmol/L — ABNORMAL LOW (ref 3.5–5.1)
Sodium: 140 mmol/L (ref 135–145)

## 2024-05-11 LAB — HEPARIN LEVEL (UNFRACTIONATED): Heparin Unfractionated: 0.68 [IU]/mL (ref 0.30–0.70)

## 2024-05-11 MED ORDER — BUDESONIDE 0.25 MG/2ML IN SUSP: 0.2500 mg | Freq: Two times a day (BID) | RESPIRATORY_TRACT | Status: DC

## 2024-05-11 MED ORDER — FUROSEMIDE 40 MG PO TABS: 40.0000 mg | ORAL_TABLET | Freq: Every day | ORAL | Status: DC

## 2024-05-11 MED ORDER — SPIRONOLACTONE 25 MG PO TABS: 25.0000 mg | ORAL_TABLET | Freq: Every day | ORAL | Status: DC

## 2024-05-11 MED ORDER — GERHARDT'S BUTT CREAM: 1.0000 | TOPICAL_CREAM | Freq: Two times a day (BID) | CUTANEOUS | Status: DC

## 2024-05-11 MED ORDER — IPRATROPIUM BROMIDE 0.02 % IN SOLN: 0.5000 mg | Freq: Two times a day (BID) | RESPIRATORY_TRACT | 12 refills | Status: DC

## 2024-05-11 MED ORDER — POTASSIUM CHLORIDE CRYS ER 20 MEQ PO TBCR
40.0000 meq | EXTENDED_RELEASE_TABLET | Freq: Once | ORAL | Status: AC
  Administered 2024-05-11: 13:00:00 40 meq via ORAL
  Filled 2024-05-11: qty 2

## 2024-05-11 MED ORDER — BANATROL TF EN LIQD: 60.0000 mL | Freq: Two times a day (BID) | ENTERAL | Status: DC

## 2024-05-11 MED ORDER — REVEFENACIN 175 MCG/3ML IN SOLN: 175.0000 ug | Freq: Every day | RESPIRATORY_TRACT | Status: DC

## 2024-05-11 MED ORDER — POLYETHYLENE GLYCOL 3350 17 G PO PACK: 17.0000 g | PACK | Freq: Every day | ORAL | Status: DC

## 2024-05-11 NOTE — TOC Progression Note (Signed)
 Transition of Care [facility]) - Progression Note    Patient Details  Name: Chase Combs MRN: [id number] Date of Birth: 1945/08/13  Transition of Care [facility]) CM/SW Contact  Lauraine FORBES Saa, LCSWA Phone Number: 05/11/2024, 11:50 AM  Clinical Narrative:     11:50 AM Patient's SNF insurance authorization was approved and is valid 05/10/2024-05/12/2024. [facility] confirmed they could admit patient today. Medical team made aware.  Expected Discharge Plan: Skilled Nursing Facility Barriers to Discharge: Continued Medical Work up               Expected Discharge Plan and Services In-house Referral: Clinical Social Work   Post Acute Care Choice: Skilled Nursing Facility Living arrangements for the past 2 months: Single Family Home                                       Social Drivers of Health (SDOH) Interventions SDOH Screenings   Food Insecurity: No Food Insecurity (04/22/2024)  Housing: Low Risk (04/22/2024)  Transportation Needs: No Transportation Needs (04/22/2024)  Utilities: Not At Risk (04/22/2024)  Social Connections: Moderately Integrated (04/24/2024)  Tobacco Use: Low Risk (04/22/2024)    Readmission Risk Interventions    01/26/2024   12:46 PM 10/21/2023   11:36 AM  Readmission Risk Prevention Plan  Transportation Screening Complete Complete  PCP or Specialist Appt within 5-7 Days  Complete  Home Care Screening  Complete  Medication Review (RN CM)  Complete  Medication Review (RN Care Manager) Referral to Pharmacy   PCP or Specialist appointment within 3-5 days of discharge Complete   HRI or Home Care Consult Complete   SW Recovery Care/Counseling Consult Complete   Palliative Care Screening Not Applicable   Skilled Nursing Facility Not Applicable

## 2024-05-11 NOTE — Progress Notes (Signed)
 PHARMACY - ANTICOAGULATION CONSULT NOTE  Pharmacy Consult for Heparin  (holding Apixaban ) Indication: atrial fibrillation  Allergies  Allergen Reactions   Codeine Nausea And Vomiting   Latex Rash    Oral blisters when used at dentist    Patient Measurements: Height: 6' (182.9 cm) Weight: 66 kg (145 lb 8.1 oz) IBW/kg (Calculated) : 77.6 HEPARIN  DW (KG): 84.3  Vital Signs: Temp: 98.7 F (37.1 C) (12/18 1212) Temp Source: Oral (12/18 1212) BP: 119/82 (12/18 1212) Pulse Rate: 75 (12/18 1212)  Labs: Recent Labs    05/09/24 0611 05/10/24 0332 05/10/24 1206 05/11/24 0512  HGB 11.4*  --   --   --   HCT 34.3*  --   --   --   PLT 428*  --   --   --   HEPARINUNFRC 0.44 0.65  --  0.68  CREATININE 0.62  --  0.75 0.74    Estimated Creatinine Clearance: 73.3 mL/min (by C-G formula based on SCr of 0.74 mg/dL).  Assessment: 78 y/o M on with increasingly distended abdomen > ileus, on apixaban  PTA for afib, holding apixaban  and continuing on heparin  in case any procedures are needed, last dose of apixaban  was 12/2 around 1930.   Heparin  level therapeutic and high normal.  No bleeding reported.  Goal of Therapy:  Heparin  level 0.3-0.7 units/ml Monitor platelets by anticoagulation protocol: Yes   Plan:  Reduce heparin  gtt to 1700 units/hr Daily heparin  level CBC Q72H F/u with transitioning back to Eliquis   Brantlee Hinde D. L[place], PharmD, BCPS, BCCCP 05/11/2024, 12:46 PM

## 2024-05-11 NOTE — Progress Notes (Addendum)
° °  Brief Progress Note   _____________________________________________________________________________________________________________  Patient Name: Chase Combs Patient DOB: 1945-08-26 Date: 05/11/2024      Data: Patient to be transported via [facility].    Action: Called [facility] for update, patient is 3rd on their list at this time.    Response:  Sent secure chat to [employer] giving update of ETA for [facility].  ADDENDUM 1843: Updated [place] RN via secure chat with ETA for patient. Per [facility] dispatch, ambulance was at hospital to pick up patient.  _____________________________________________________________________________________________________________  [facility] RN Expeditor Tyshia Fenter Please contact us  directly via secure chat (search for [facility]) or by calling us  at [phone] [facility]).

## 2024-05-11 NOTE — Plan of Care (Signed)

## 2024-05-11 NOTE — Plan of Care (Signed)
 Problem: Education: Goal: Knowledge of General Education information will improve Description: Including pain rating scale, medication(s)/side effects and non-pharmacologic comfort measures 05/11/2024 0619 by Gilberto Tinnie KIDD, RN Outcome: Progressing 05/11/2024 0619 by Gilberto Tinnie KIDD, RN Outcome: Progressing   Problem: Health Behavior/Discharge Planning: Goal: Ability to manage health-related needs will improve 05/11/2024 0619 by Gilberto Tinnie KIDD, RN Outcome: Progressing 05/11/2024 0619 by Gilberto Tinnie KIDD, RN Outcome: Progressing   Problem: Clinical Measurements: Goal: Ability to maintain clinical measurements within normal limits will improve 05/11/2024 0619 by Gilberto Tinnie KIDD, RN Outcome: Progressing 05/11/2024 0619 by Gilberto Tinnie KIDD, RN Outcome: Progressing Goal: Will remain free from infection 05/11/2024 0619 by Gilberto Tinnie KIDD, RN Outcome: Progressing 05/11/2024 0619 by Gilberto Tinnie KIDD, RN Outcome: Progressing Goal: Diagnostic test results will improve 05/11/2024 0619 by Gilberto Tinnie KIDD, RN Outcome: Progressing 05/11/2024 0619 by Gilberto Tinnie KIDD, RN Outcome: Progressing Goal: Respiratory complications will improve 05/11/2024 0619 by Gilberto Tinnie KIDD, RN Outcome: Progressing 05/11/2024 0619 by Gilberto Tinnie KIDD, RN Outcome: Progressing Goal: Cardiovascular complication will be avoided 05/11/2024 0619 by Gilberto Tinnie KIDD, RN Outcome: Progressing 05/11/2024 0619 by Gilberto Tinnie KIDD, RN Outcome: Progressing   Problem: Activity: Goal: Risk for activity intolerance will decrease 05/11/2024 0619 by Gilberto Tinnie KIDD, RN Outcome: Progressing 05/11/2024 0619 by Gilberto Tinnie KIDD, RN Outcome: Progressing   Problem: Nutrition: Goal: Adequate nutrition will be maintained 05/11/2024 0619 by Gilberto Tinnie KIDD, RN Outcome: Progressing 05/11/2024 0619 by Gilberto Tinnie KIDD, RN Outcome: Progressing   Problem: Coping: Goal: Level of anxiety will  decrease 05/11/2024 0619 by Gilberto Tinnie KIDD, RN Outcome: Progressing 05/11/2024 0619 by Gilberto Tinnie KIDD, RN Outcome: Progressing   Problem: Elimination: Goal: Will not experience complications related to bowel motility 05/11/2024 0619 by Gilberto Tinnie KIDD, RN Outcome: Progressing 05/11/2024 0619 by Gilberto Tinnie KIDD, RN Outcome: Progressing Goal: Will not experience complications related to urinary retention 05/11/2024 0619 by Gilberto Tinnie KIDD, RN Outcome: Progressing 05/11/2024 0619 by Gilberto Tinnie KIDD, RN Outcome: Progressing   Problem: Pain Managment: Goal: General experience of comfort will improve and/or be controlled 05/11/2024 0619 by Gilberto Tinnie KIDD, RN Outcome: Progressing 05/11/2024 0619 by Gilberto Tinnie KIDD, RN Outcome: Progressing   Problem: Safety: Goal: Ability to remain free from injury will improve 05/11/2024 0619 by Gilberto Tinnie KIDD, RN Outcome: Progressing 05/11/2024 0619 by Gilberto Tinnie KIDD, RN Outcome: Progressing   Problem: Skin Integrity: Goal: Risk for impaired skin integrity will decrease 05/11/2024 0619 by Gilberto Tinnie KIDD, RN Outcome: Progressing 05/11/2024 0619 by Gilberto Tinnie KIDD, RN Outcome: Progressing   Problem: Education: Goal: Ability to describe self-care measures that may prevent or decrease complications (Diabetes Survival Skills Education) will improve 05/11/2024 0619 by Gilberto Tinnie KIDD, RN Outcome: Progressing 05/11/2024 0619 by Gilberto Tinnie KIDD, RN Outcome: Progressing Goal: Individualized Educational Video(s) 05/11/2024 0619 by Gilberto Tinnie KIDD, RN Outcome: Progressing 05/11/2024 0619 by Gilberto Tinnie KIDD, RN Outcome: Progressing   Problem: Coping: Goal: Ability to adjust to condition or change in health will improve 05/11/2024 0619 by Gilberto Tinnie KIDD, RN Outcome: Progressing 05/11/2024 0619 by Gilberto Tinnie KIDD, RN Outcome: Progressing   Problem: Fluid Volume: Goal: Ability to maintain a balanced  intake and output will improve 05/11/2024 0619 by Gilberto Tinnie KIDD, RN Outcome: Progressing 05/11/2024 0619 by Gilberto Tinnie KIDD, RN Outcome: Progressing   Problem: Health Behavior/Discharge Planning: Goal: Ability to identify and utilize available resources and services will improve 05/11/2024 0619 by Gilberto Tinnie KIDD, RN Outcome: Progressing 05/11/2024 [phone] by  Pawel Soules, Tinnie KIDD, RN Outcome: Progressing Goal: Ability to manage health-related needs will improve 05/11/2024 0619 by Gilberto Tinnie KIDD, RN Outcome: Progressing 05/11/2024 0619 by Gilberto Tinnie KIDD, RN Outcome: Progressing   Problem: Metabolic: Goal: Ability to maintain appropriate glucose levels will improve 05/11/2024 0619 by Gilberto Tinnie KIDD, RN Outcome: Progressing 05/11/2024 0619 by Gilberto Tinnie KIDD, RN Outcome: Progressing   Problem: Nutritional: Goal: Maintenance of adequate nutrition will improve 05/11/2024 0619 by Gilberto Tinnie KIDD, RN Outcome: Progressing 05/11/2024 0619 by Gilberto Tinnie KIDD, RN Outcome: Progressing Goal: Progress toward achieving an optimal weight will improve 05/11/2024 0619 by Gilberto Tinnie KIDD, RN Outcome: Progressing 05/11/2024 0619 by Gilberto Tinnie KIDD, RN Outcome: Progressing   Problem: Skin Integrity: Goal: Risk for impaired skin integrity will decrease 05/11/2024 0619 by Gilberto Tinnie KIDD, RN Outcome: Progressing 05/11/2024 0619 by Gilberto Tinnie KIDD, RN Outcome: Progressing   Problem: Tissue Perfusion: Goal: Adequacy of tissue perfusion will improve 05/11/2024 0619 by Gilberto Tinnie KIDD, RN Outcome: Progressing 05/11/2024 0619 by Gilberto Tinnie KIDD, RN Outcome: Progressing

## 2024-05-11 NOTE — Progress Notes (Signed)
 Daily Progress Note   Date: 05/11/2024   Patient Name: Chase Combs  DOB: [birth year]  MRN: [id number]  Age / Sex: 78 y.o., male  Attending Physician: Sebastian Toribio GAILS, MD Primary Care Physician: Wonda Worth SQUIBB, [place] Admit Date: 04/21/2024 Length of Stay: 19 days  Reason for Follow-up: Establishing goals of care  Past Medical History:  Diagnosis Date   Abdominal hernia    per pt   Arthritis    hands   Benign localized prostatic hyperplasia with lower urinary tract symptoms (LUTS)    CAD (coronary artery disease) 05/2004   cardiologist--- dr anner;  positive stress test , had outpt cardiac cath 05-27-2004 stenosis RI;  06-18-2004 cath w/ PCI and BMS x1 to pRI;   STEMI 02-16-2009  s/p cath w/ PCI thrombectomy for total occluded RCA , BMS x1 to pRCA;  nuclear stress test 03-15-2012 low risk no ischemia but sig inferior-inferolateral infarct , ef 51%   Dyslipidemia    Essential hypertension    Heart murmur    History of COVID-19 05/2020   per pt mild symptoms that resolved   History of ST elevation myocardial infarction (STEMI) 02/16/2009   inferior STEMI   cath w/ pci w/ BMS to pRCA   History of urinary retention    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[facility]) 04/2021   urologist--- dr borden/ radiation oncology-- dr patrcia;  dx 12/ 2022,  Gleason 4+5, PSA 24.6, volume 94.6cc;  plan to start IMRT   OSA (obstructive sleep apnea)    per pt dx approx 2008, no cpap intolerant   PONV (postoperative nausea and vomiting)    Pre-diabetes    S/p bare metal coronary artery stent    01/ 2006  x1 BMS to pRI ([employer]Star study stent);  and 09/ 2010  x1 BMS to pRCA   Status post total knee replacement, 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    Per H&P on 04/22/2024 by [facility] MD, patient presented after a couple days of N/V/D with decreased PO intake. Patient reported dizziness and lightheadedness resulting in a fall with head strike but no loss of consciousness.    Per GI progress note on 04/28/2024 by Nandigam MD interventions for acute colonic pseudo-obstruction limited by tenuous cardiac status without any current plans for repeat colonoscopy for decompression.    CT head on 04/21/24 negative for acute intracranial abnormality. CT abdomen on 11/28 demonstrated mild dilatation of colon with air-fluid levels with no transition point and distended loops and stomach. CT Angio Chest PE on 04/28/2024 negative for PE but increased bilateral GGO. TEE on 02/21/2024 showed EF 20-25%, LV severely decreased function and global hypokinesis, RV function moderately reduced.    Palliative medicine consulted for goals of care conversation.   SUMMARY OF RECOMMENDATIONS DNR-limited Full scope interventions, aware that there are limitations due to his heart failure  Symptom Management:  Per primary team  Code Status: DNR - Limited (DNR/DNI)  Prognosis: Unable to determine  Discharge Planning: Rehab facility  Discussed with: Thompson MD 05/11/2024 about the patient's continued eagerness to participate in rehab, continues to not be ready to discuss possible scenarios if things decline.   Subjective:   Subjective: Chart Reviewed. Updates received. Patient Assessed. Created space and opportunity  for patient  and family to explore thoughts and feelings regarding current medical situation.  Today's Discussion:  Met with patient at bedside without any visitors. Patient awake, alert, and eager to be discharged to [facility] for rehab. Hopeful that he will discharge today so that he may be able to get home before Christmas. Patient hopes that if he cannot be discharged home prior to Christmas that he at least has a chance to get home to visit  while he undergoes rehab. Shared with patient that it may be possible to at least visit but unlikely that he will be discharged prior to Christmas.   Review of Systems  Respiratory:  Positive for shortness of breath.        With activity  All other systems reviewed and are negative.   Objective:   Primary Diagnoses: Present on Admission:  Enterocolitis  Hypokalemia  Acute kidney injury  Elevated troponin  Hypotension  Chronic systolic CHF (congestive heart failure) (HCC)  Paroxysmal atrial flutter (HCC)  HLD (hyperlipidemia)  Anemia of chronic disease  Ogilvie syndrome  Ileus, unspecified (HCC)  Protein-calorie malnutrition, severe  Vital Signs:  BP 119/82 (BP Location: Left Arm)   Pulse 75   Temp 98.7 F (37.1 C) (Oral)   Resp 16   Ht 6' (1.829 m)   Wt 66 kg   SpO2 96%   BMI 19.73 kg/m   Physical Exam Constitutional:      Appearance: He is ill-appearing.  HENT:     Head: Normocephalic and atraumatic.     Nose: Nose normal.     Mouth/Throat:     Pharynx: Oropharynx is clear.  Eyes:     Extraocular Movements: Extraocular movements intact.  Cardiovascular:     Rate and Rhythm: Normal rate.     Pulses: Normal pulses.  Pulmonary:     Effort: Pulmonary effort is normal.  Abdominal:     General: There is distension.     Palpations: Abdomen is soft.  Skin:    General: Skin is warm and dry.  Neurological:     Mental Status: He is alert. Mental status is at baseline.  Psychiatric:        Mood and Affect: Mood normal.     Palliative Assessment/Data: 60%   Existing Vynca/ACP Documentation: None  Thank you for allowing us  to participate in the care of Chase Combs PMT will continue to support holistically.  I personally spent a total of 25 minutes in the care of the patient today including preparing to see the patient, counseling and educating, referring and communicating with other health care professionals, and documenting clinical information in the  EHR.  Khole Arterburn E Modestine Scherzinger, PA-C  Palliative Medicine Team  Team Phone # [phone] (Nights/Weekends) 05/11/2024 12:14 PM

## 2024-05-11 NOTE — Progress Notes (Signed)
 Physical Therapy Treatment Patient Details Name: Chase Combs MRN: [id number] DOB: [birth year] Today's Date: 05/11/2024   History of Present Illness Pt is a 78 y.o. male admitted 11/28 with enterocolitis, presyncope, and hypotension. Multiple recent hospitalizations for cardiac and GI issues.PMH: Ogilvie syndrome s/p colonic decompression, NSTEMI, CAD, heart failure, a fib, HTN, HLD, DMII, CKD, OSA, prostate ca    PT Comments  Pt admitted with above diagnosis. Pt was able to ambulate with RW without LOB and incr distance. Pt still needs some assist but is progressing. Pt fatigued after walk and wanted to save energy for leaving today so deferred exercises. Plan is to go to SNF today.   Met 0/4 goals and revised goals today. Pt currently with functional limitations due to the deficits listed below (see PT Problem List). Pt will benefit from acute skilled PT to increase their independence and safety with mobility to allow discharge.       If plan is discharge home, recommend the following: A lot of help with walking and/or transfers;A lot of help with bathing/dressing/bathroom;Assistance with cooking/housework;Assist for transportation;Help with stairs or ramp for entrance   Can travel by private vehicle     No  Equipment Recommendations  None recommended by PT    Recommendations for Other Services       Precautions / Restrictions Precautions Precautions: Fall;Other (comment) Recall of Precautions/Restrictions: Intact Precaution/Restrictions Comments: watch BP Restrictions Weight Bearing Restrictions Per Provider Order: No     Mobility  Bed Mobility Overal bed mobility: Needs Assistance Bed Mobility: Supine to Sit, Sit to Supine     Supine to sit: HOB elevated, Contact guard Sit to supine: Contact guard assist, HOB elevated   General bed mobility comments: On arrival, pt reports he had BM in bed.  Assisted pt with cleaning prior to moving with pt independent with rolling. CGA  for safety, no physical assist needed this session    Transfers Overall transfer level: Needs assistance Equipment used: Rolling walker (2 wheels) Transfers: Sit to/from Stand, Bed to chair/wheelchair/BSC Sit to Stand: Contact guard assist           General transfer comment: No assist needed but cues given for hand placement    Ambulation/Gait Ambulation/Gait assistance: Contact guard assist, +2 safety/equipment, Min assist Gait Distance (Feet): 145 Feet Assistive device: Rolling walker (2 wheels) Gait Pattern/deviations: Step-through pattern Gait velocity: dec     General Gait Details: pt ambulating with slow shuffling steps, wide BOS noted, no overt LOB. Pt able to withstand small challenges to balance.   Stairs             Wheelchair Mobility     Tilt Bed    Modified Rankin (Stroke Patients Only)       Balance Overall balance assessment: Needs assistance Sitting-balance support: Feet supported Sitting balance-Leahy Scale: Good Sitting balance - Comments: sits unsupported EOB   Standing balance support: Bilateral upper extremity supported, During functional activity Standing balance-Leahy Scale: Fair Standing balance comment: able to maintain static standing without UE support                            Communication Communication Communication: No apparent difficulties Factors Affecting Communication: Hearing impaired  Cognition Arousal: Alert Behavior During Therapy: WFL for tasks assessed/performed, Anxious                             Following commands:  Intact      Cueing Cueing Techniques: Verbal cues  Exercises      General Comments General comments (skin integrity, edema, etc.): VSS      Pertinent Vitals/Pain Pain Assessment Pain Assessment: Faces Faces Pain Scale: Hurts a little bit Pain Location: generalized with mobility Pain Descriptors / Indicators: Grimacing Pain Intervention(s): Limited activity  within patient's tolerance, Monitored during session, Repositioned    Home Living                          Prior Function            PT Goals (current goals can now be found in the care plan section) Acute Rehab PT Goals Patient Stated Goal: get stronger and return home PT Goal Formulation: With patient Time For Goal Achievement: 05/25/24 Progress towards PT goals: Progressing toward goals    Frequency    Min 2X/week      PT Plan      Co-evaluation              AM-PAC PT 6 Clicks Mobility   Outcome Measure  Help needed turning from your back to your side while in a flat bed without using bedrails?: A Little Help needed moving from lying on your back to sitting on the side of a flat bed without using bedrails?: A Little Help needed moving to and from a bed to a chair (including a wheelchair)?: A Little Help needed standing up from a chair using your arms (e.g., wheelchair or bedside chair)?: A Little Help needed to walk in hospital room?: A Little Help needed climbing 3-5 steps with a railing? : Total 6 Click Score: 16    End of Session Equipment Utilized During Treatment: Gait belt Activity Tolerance: Patient tolerated treatment well Patient left: with call bell/phone within reach;in bed;with bed alarm set Nurse Communication: Mobility status PT Visit Diagnosis: Other abnormalities of gait and mobility (R26.89);Muscle weakness (generalized) (M62.81)     Time: 8783-8759 PT Time Calculation (min) (ACUTE ONLY): 24 min  Charges:    $Gait Training: 8-22 mins $Self Care/Home Management: 8-22 PT General Charges $$ ACUTE PT VISIT: 1 Visit                     Chase Combs M,PT Acute Rehab Services [phone]    Chase Combs 05/11/2024, 2:28 PM

## 2024-05-11 NOTE — Plan of Care (Signed)
 Problem: Education: Goal: Knowledge of General Education information will improve Description: Including pain rating scale, medication(s)/side effects and non-pharmacologic comfort measures 05/11/2024 1419 by Raymond Tedi CROME, RN Outcome: Adequate for Discharge 05/11/2024 1419 by Raymond Tedi CROME, RN Outcome: Progressing   Problem: Health Behavior/Discharge Planning: Goal: Ability to manage health-related needs will improve 05/11/2024 1419 by Raymond Tedi CROME, RN Outcome: Adequate for Discharge 05/11/2024 1419 by Raymond Tedi CROME, RN Outcome: Progressing   Problem: Clinical Measurements: Goal: Ability to maintain clinical measurements within normal limits will improve 05/11/2024 1419 by Talyn Dessert L, RN Outcome: Adequate for Discharge 05/11/2024 1419 by Raymond Tedi CROME, RN Outcome: Progressing Goal: Will remain free from infection 05/11/2024 1419 by Raymond Tedi CROME, RN Outcome: Adequate for Discharge 05/11/2024 1419 by Raymond Tedi CROME, RN Outcome: Progressing Goal: Diagnostic test results will improve 05/11/2024 1419 by Raymond Tedi CROME, RN Outcome: Adequate for Discharge 05/11/2024 1419 by Raymond Tedi CROME, RN Outcome: Progressing Goal: Respiratory complications will improve 05/11/2024 1419 by Raymond Tedi CROME, RN Outcome: Adequate for Discharge 05/11/2024 1419 by Raymond Tedi CROME, RN Outcome: Progressing Goal: Cardiovascular complication will be avoided 05/11/2024 1419 by Stein Windhorst L, RN Outcome: Adequate for Discharge 05/11/2024 1419 by Raymond Tedi CROME, RN Outcome: Progressing   Problem: Activity: Goal: Risk for activity intolerance will decrease 05/11/2024 1419 by Raymond Tedi CROME, RN Outcome: Adequate for Discharge 05/11/2024 1419 by Raymond Tedi CROME, RN Outcome: Progressing   Problem: Nutrition: Goal: Adequate nutrition will be maintained 05/11/2024 1419 by Raymond Tedi CROME, RN Outcome: Adequate for Discharge 05/11/2024 1419 by Raymond Tedi CROME, RN Outcome: Progressing   Problem: Coping: Goal: Level of anxiety will decrease 05/11/2024 1419 by Raymond Tedi CROME, RN Outcome: Adequate for Discharge 05/11/2024 1419 by Raymond Tedi CROME, RN Outcome: Progressing   Problem: Elimination: Goal: Will not experience complications related to bowel motility 05/11/2024 1419 by Raymond Tedi CROME, RN Outcome: Adequate for Discharge 05/11/2024 1419 by Raymond Tedi CROME, RN Outcome: Progressing Goal: Will not experience complications related to urinary retention 05/11/2024 1419 by Raymond Tedi CROME, RN Outcome: Adequate for Discharge 05/11/2024 1419 by Raymond Tedi CROME, RN Outcome: Progressing   Problem: Pain Managment: Goal: General experience of comfort will improve and/or be controlled 05/11/2024 1419 by Raymond Tedi CROME, RN Outcome: Adequate for Discharge 05/11/2024 1419 by Raymond Tedi CROME, RN Outcome: Progressing   Problem: Safety: Goal: Ability to remain free from injury will improve 05/11/2024 1419 by Raymond Tedi CROME, RN Outcome: Adequate for Discharge 05/11/2024 1419 by Raymond Tedi CROME, RN Outcome: Progressing   Problem: Skin Integrity: Goal: Risk for impaired skin integrity will decrease 05/11/2024 1419 by Raymond Tedi CROME, RN Outcome: Adequate for Discharge 05/11/2024 1419 by Raymond Tedi CROME, RN Outcome: Progressing   Problem: Education: Goal: Ability to describe self-care measures that may prevent or decrease complications (Diabetes Survival Skills Education) will improve 05/11/2024 1419 by Raymond Tedi CROME, RN Outcome: Adequate for Discharge 05/11/2024 1419 by Raymond Tedi CROME, RN Outcome: Progressing Goal: Individualized Educational Video(s) 05/11/2024 1419 by Raymond Tedi CROME, RN Outcome: Adequate for Discharge 05/11/2024 1419 by Raymond Tedi CROME, RN Outcome: Progressing   Problem: Coping: Goal: Ability to adjust to condition or change in health will improve 05/11/2024 1419 by Ameen Mostafa L, RN Outcome: Adequate for Discharge 05/11/2024 1419 by Raymond Tedi CROME, RN Outcome: Progressing   Problem: Fluid Volume: Goal: Ability to maintain a balanced intake and output will improve 05/11/2024 1419 by Raymond Tedi CROME, RN Outcome: Adequate for Discharge 05/11/2024  1419 by Raymond Tedi CROME, RN Outcome: Progressing   Problem: Health Behavior/Discharge Planning: Goal: Ability to identify and utilize available resources and services will improve 05/11/2024 1419 by Arvis Miguez, Tedi CROME, RN Outcome: Adequate for Discharge 05/11/2024 1419 by Raymond Tedi CROME, RN Outcome: Progressing Goal: Ability to manage health-related needs will improve 05/11/2024 1419 by Raymond Tedi CROME, RN Outcome: Adequate for Discharge 05/11/2024 1419 by Raymond Tedi CROME, RN Outcome: Progressing   Problem: Metabolic: Goal: Ability to maintain appropriate glucose levels will improve 05/11/2024 1419 by Raymond Tedi CROME, RN Outcome: Adequate for Discharge 05/11/2024 1419 by Raymond Tedi CROME, RN Outcome: Progressing   Problem: Nutritional: Goal: Maintenance of adequate nutrition will improve 05/11/2024 1419 by Raymond Tedi CROME, RN Outcome: Adequate for Discharge 05/11/2024 1419 by Raymond Tedi CROME, RN Outcome: Progressing Goal: Progress toward achieving an optimal weight will improve 05/11/2024 1419 by Raymond Tedi CROME, RN Outcome: Adequate for Discharge 05/11/2024 1419 by Raymond Tedi CROME, RN Outcome: Progressing   Problem: Skin Integrity: Goal: Risk for impaired skin integrity will decrease 05/11/2024 1419 by Raymond Tedi CROME, RN Outcome: Adequate for Discharge 05/11/2024 1419 by Raymond Tedi CROME, RN Outcome: Progressing   Problem: Tissue  Perfusion: Goal: Adequacy of tissue perfusion will improve 05/11/2024 1419 by Armondo Cech, Tedi CROME, RN Outcome: Adequate for Discharge 05/11/2024 1419 by Raymond Tedi CROME, RN Outcome: Progressing   Problem: Malnutrition  (NI-5.2) Goal: Food and/or nutrient delivery Description: Individualized approach for food/nutrient provision. Outcome: Adequate for Discharge   Problem: Acute Rehab PT Goals(only PT should resolve) Goal: Pt Will Go Supine/Side To Sit Outcome: Adequate for Discharge Goal: Pt Will Go Sit To Supine/Side Outcome: Adequate for Discharge Goal: Patient Will Transfer Sit To/From Stand Outcome: Adequate for Discharge Goal: Pt Will Ambulate Outcome: Adequate for Discharge   Problem: Acute Rehab OT Goals (only OT should resolve) Goal: Pt. Will Perform Upper Body Dressing Outcome: Adequate for Discharge Goal: Pt. Will Perform Lower Body Dressing Outcome: Adequate for Discharge Goal: Pt. Will Transfer To Toilet Outcome: Adequate for Discharge Goal: Pt. Will Perform Tub/Shower Transfer Outcome: Adequate for Discharge

## 2024-05-11 NOTE — Progress Notes (Signed)
 Report called in to oncoming nurse at number provided by casemanager, pt belongings gathered, AVS printed , pt aware of transfer, PIV's removed, heparin  drip stopped per MD request.

## 2024-05-11 NOTE — TOC Transition Note (Addendum)
 Transition of Care [facility]) - Discharge Note   Patient Details  Name: Chase Combs MRN: [id number] Date of Birth: December 07, 1945  Transition of Care [facility] CM/SW Contact:  Lauraine FORBES Saa, LCSWA Phone Number: 05/11/2024, 1:43 PM   Clinical Narrative:     Patient will DC to: [facility] Anticipated DC date: 05/11/2024 Family notified: Clemente Dewey; Spouse; [phone] Transport by: [facility]   Per MD patient ready for DC to [facility]. RN to call report prior to discharge [phone]). RN, patient, patient's family (to be notified by patient), and facility notified of DC. Discharge Summary and FL2 sent to facility. DC packet on chart. Ambulance transport requested for patient at 13:42.  CSW will sign off for now as social work intervention is no longer needed. Please consult us  again if new needs arise.  3:16 PM CSW informed SNF of outpatient palliative recommendation.  Final next level of care: Skilled Nursing Facility Barriers to Discharge: Barriers Resolved   Patient Goals and CMS Choice            Discharge Placement              Patient chooses bed at: [facility], [place] Patient to be transferred to facility by: [facility] Name of family member notified: Claud Gowan; Spouse; [phone] Patient and family notified of of transfer: 05/11/24  Discharge Plan and Services Additional resources added to the After Visit Summary for   In-house Referral: Clinical Social Work   Post Acute Care Choice: Skilled Nursing Facility                               Social Drivers of Health (SDOH) Interventions SDOH Screenings   Food Insecurity: No Food Insecurity (04/22/2024)  Housing: Low Risk (04/22/2024)  Transportation Needs: No Transportation Needs (04/22/2024)  Utilities: Not At Risk (04/22/2024)  Social Connections: Moderately Integrated (04/24/2024)  Tobacco Use: Low Risk (04/22/2024)     Readmission Risk Interventions    01/26/2024   12:46 PM 10/21/2023    11:36 AM  Readmission Risk Prevention Plan  Transportation Screening Complete Complete  PCP or Specialist Appt within 5-7 Days  Complete  Home Care Screening  Complete  Medication Review (RN CM)  Complete  Medication Review (RN Care Manager) Referral to Pharmacy   PCP or Specialist appointment within 3-5 days of discharge Complete   HRI or Home Care Consult Complete   SW Recovery Care/Counseling Consult Complete   Palliative Care Screening Not Applicable   Skilled Nursing Facility Not Applicable

## 2024-05-11 NOTE — Discharge Summary (Signed)
 Physician Discharge Summary  SACRAMENTO MONDS FMW:[id number] DOB: Feb 06, 1946 DOA: 04/21/2024  PCP: Wonda Worth SQUIBB, PA  Admit date: 04/21/2024 Discharge date: 05/11/2024  Time spent: 60 minutes  Recommendations for Outpatient Follow-up:  Follow-up with MD at skilled nursing facility.  Patient will need a basic metabolic profile, magnesium  level done in 1 week to follow-up on electrolytes and renal function. Follow-up with Dr. Anner, cardiology in 2 weeks. Follow-up with Dr. Nieves, urology in 4 weeks. Follow-up with Dr. Stacia, gastroenterology in 3 weeks.   Discharge Diagnoses:  Principal Problem:   Enterocolitis Active Problems:   Ogilvie syndrome   Chronic systolic CHF (congestive heart failure) (HCC)   Hypokalemia   HLD (hyperlipidemia)   Acute kidney injury   Protein-calorie malnutrition, severe   DM2 (diabetes mellitus, type 2) (HCC)   Postural dizziness with presyncope   Ileus, unspecified (HCC)   Elevated troponin   Fall at home, initial encounter   Hypotension   Paroxysmal atrial flutter (HCC)   Anemia of chronic disease   Adynamic ileus (HCC)   Chronic combined systolic and diastolic congestive heart failure (HCC)   Malnutrition of moderate degree   Discharge Condition: Stable and improved.  Diet recommendation: Heart healthy  Filed Weights   05/08/24 0333 05/09/24 0500 05/10/24 0335  Weight: 81.6 kg 81.6 kg 66 kg    History of present illness:  HPI per Dr. Marcene Sherida JONETTA [place] is a 78 y.o. male with medical history significant for chronic biventricular systolic heart failure, paroxysmal atrial flutter chronically anticoagulated on Eliquis , type 2 diabetes mellitus, who is admitted to [facility] on 04/21/2024 by way of transfer from [facility] with suspected gastroenterocolitis after presenting from home to the latter facility complaining of single episode of presyncope.   Over the last 1 to 2 days, the patient reports development of  nausea, vomiting, diarrhea.  He he notes that his symptoms started with nausea resulting in 3-4 episodes of nonbloody, nonbilious emesis.  Following this, he notes development of loose watery stool, noting at least 5-6 episodes of loose watery stool over the course of the last day, noting that his nausea/vomiting is improved while continuing to experience the loose watery stool.  He specifically denies any associated melena or hematochezia.  The symptoms were associated with some generalized crampy, nonradiating abdominal discomfort.  Denies any associated dysuria or gross hematuria.  In the context of the above, he reports significant decline in oral intake over the last 1 to 2 days, including very limited consumption of food and water  over that timeframe.  He also noted associated subjective fever in the absence of chills, fully rigors.  He notes that over the last 6 hours, his loose stool has started to subside, noting that most recent bowel movement occurred approximately around 2000 on 11/28.  He notes that this has been associated with improvement in his crampy abdominal discomfort, with the patient now noting no residual abdominal discomfort.  He continues to produce flatus and denies any residual nausea/vomiting at this time.   However, while at home yesterday, The nausea, vomiting, diarrhea, he notes that he attempted to rise from a seated to a standing position at which time he developed dizziness, lightheadedness, and while he did not formally lose consciousness, he reports that the symptoms resulted in a, will fall to the floor in which he struck his head on the floor.  This was not associated with any chest pain, shortness of breath.  He has a history of paroxysmal  atrial flutter for which he is chronically anticoagulated on Eliquis .  Per chart review, he is status post TEE electrocardioversion on 02/21/2024.  Additionally patient medications notable for amiodarone  as well as digoxin .   His medical  history is also notable for chronic biventricular systolic heart failure, with most recent TTE performed on 02/02/2024 demonstrating LVEF 20 to 25%, indeterminate diastolic parameters, moderately reduced right ventricular systolic function, moderate mitral digitation, mild to moderate tricuspid regurgitation and trivial aortic regurgitation.  Is on Lasix  as well as spironolactone  as an outpatient.  This is in addition to outpatient use of Entresto .  Per chart review, baseline creatinine appears to be 0.9-1.2.   Per chart review, it appears that his baseline systolic blood pressures are in the high 90s to low 100s mmHg.    In the setting of the above single episode of presyncope at home, he presented to [facility] for further evaluation and management thereof.         [facility] ED Course:  Vital signs in the ED were notable for the following: Afebrile; heart rates in the 70s to 80s; systolic blood pressures initially in the 70s to 80s, subsidy improving into the 90s and ultimately the low 100s following initiation of IV fluids, as further detailed below.  Respiratory rate 17-23, oxygen saturation 93 to 99% on room air.   Labs were notable for the following: CMP was notable for the following: Potassium 3.3, bicarb 22, creatinine 1.65 compared to most recent prior value of 1.14 on 04/07/2024, BUN DeGroote ratio 23, glucose 132, liver enzymes were within normal limits.  Initial high sensitive troponin I was 71, repeat trending down to 67, compared to most recent prior value of 51 on 01/07/2024.  Lactic acid 1.2.  CBC notable for white cell count 3300, hemoglobin 12.7 associated with normocytic/Norocarp properties and ALT up to most recent prior hemoglobin data point of 11.9 on 03/09/2024, platelet count 364.  INR 1.7.  Urinalysis has been ordered, with result currently pending.  COVID, influenza, RSV PCR were all negative.   Per my interpretation, EKG in ED demonstrated the following: Sinus rhythm with  bifascicular block, heart rate 80 and no evidence of T wave or ST changes, including no evidence of ST elevation.   Imaging in the ED, per corresponding formal radiology read, was notable for the following: Unconscious CT head showed no evidence of acute intracranial process, including no evidence of intracranial hemorrhage or evidence of acute infarct.  CT cervical spine showed no evidence of acute cervical spine fracture or subluxation injury.  1 view chest x-ray showed low lung volumes with mild right perihilar linear scarring versus atelectasis, will all demonstrating no evidence of infiltrate edema, effusion, or pneumothorax.  CT abdomen/pelvis without contrast showed diffuse mild dilation of the colon without transition point, suggestive of colitis versus ileus, will also noting distention of ileal loops and stomach, without any evidence of bowel perforation, abscess, or obstruction.  No evidence of postrenal obstruction.   While in the ED, the following were administered: Acetaminophen  650 mg p.o. x 1 dose, Reglan  5 mg IV x 1 dose, potassium chloride  40 mill colons p.o. x 1 dose, lactated Ringer 's at 500 cc/h followed by initiation continuous LR running at 75 cc/h.   Subsequently, the patient was admitted for further evaluation and management of suspected gastro enterocolitis with presenting labs notable for acute kidney injury, hypokalemia after presenting for evaluation of a single presyncopal episode resulting in ground-level fall as well as initial hypotension that is  improved with IV fluids.     Hospital Course:  Diffuse progressive ileus Recurrent Ogilvie Syndrome/pseudoobstruction Severe protein caloric malnutrition Profound diffuse diarrhea History of recent hospitalization (01/22/24-02/25/24) for similar GI symptoms with extensive workup. Underwent colonic decompression (02/18/24) with improvement Patient remained afebrile, with no leukocytosis C diff and enteric PCR panel negative CT  A/P w/o contrast with noted diffuse mild dilatation Repeat abdominal x-ray on 12/3 with diffuse ileus involving colon and small bowel GI consulted, no plan for intervention at this time given elevated risk of complications given sedation requirements. - Patient placed on the bowel regimen with clinical improvement -Patient having bowel movements, abdomen was soft mildly distended and patient tolerated oral intake. Diarrhea resolving back to baseline which is loose stool. -Patient will be discharged on a bowel regimen of MiraLAX  daily. Outpatient follow-up with GI.  Acute hypoxic respiratory failure, resolved Currently on room air at rest D-dimer mildly elevated Procalcitonin 0.26 Likely in the setting of abdominal distension/limited pulmonary excursion from underlying ileus/pseudoobstruction/gastroenterocolitis  CTA chest negative for PE PCCM consulted - completed 5 days of IV antibiotics, patient maintained on supportive care  - DuoNebs, nebulizers, Mucomyst . - Patient was discharged in stable and improved condition.   CHFrEF Ischemic cardiomyopathy Recent prolonged hospitalization complicated by cardiogenic shock with acute CHF exacerbation in the setting of LAD stent stenosis  Last echo 01/2024 with LVEF 20-25% CXR 12/2 showed bilateral opacities Cardiology consulted, maintained on home regimen lasix , spironolactone . - Entresto , digoxin , Jardiance  was held during the hospitalization.  - Due to soft blood pressure Entresto  will be held on discharge until follow-up with primary cardiologist.  - Jardiance  will be resumed on discharge.  - Digoxin  also held on discharge until follow-up with primary cardiologist.  - Patient will be discharged in stable condition.  - Outpatient follow-up with primary cardiologist.    Persistent Afib/flutter with RVR Heart rate improved, now back in sinus rhythm as of 12/7 S/p TEE CV on 02/21/2024 Cardiology on board, appreciate recs - continue amiodarone ,  IV heparin  (Eliquis  held) during hospitalization the patient also had presented with gross hematuria and also ileus/Ogilvie syndrome as noted above. -Patient remained stable and rate remained controlled. -Patient monitored on IV heparin  without any further bleeding. -Eliquis  will be resumed on discharge. -Outpatient follow-up with primary cardiologist.   Gross hematuria Resolved Noted hematuria on 12/4 in the setting of anticoagulation Renal ultrasound showed enlarged prostate protruding into the inferior bladder, layering debris's in the bladder could reflect blood clot Urology consulted, placed foley on 12/5. - Patient maintained on Flomax . -Patient underwent a voiding trial which was successful -Patient had no further gross hematuria during the hospitalization. -Outpatient follow-up with urology.   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   - Patient maintained on home regimen Plavix , Crestor .   - Eliquis  was held and patient maintained on IV heparin  during the hospitalization and recommendations were to resume Eliquis  on discharge.   - Patient follow-up with primary cardiologist in 2 weeks.   Hypokalemia/hypomagnesemia Likely in the setting of loose stool. - Patient placed back on home regimen oral potassium supplementation. -Outpatient follow-up.  AKI on CKD stage II, resolved Urinary obstruction, unspecified, resolved Cr 1.11 < 2.1, baseline 0.9-1.5 Likely in the setting of dehydration from diarrhea and ?obstruction - resolved with foley placement and  fluids Patient maintained on Flomax  0.4 mg daily.   - Patient seen by urology during the hospitalization who had also recommended outpatient cystoscopy to be done.   - Patient followed by urology underwent a successful voiding trial on 05/08/2024.   - Patient will be discharged on Flomax .   - Outpatient follow-up with urology.     Peptic ulcer disease Recent hospitalization with EGD (02/02/24) showing esophageal ulcer without bleeding, grade C reflux esophagitis without bleeding, duodenal erosions. - Patient maintained on PPI during the hospitalization which he will be discharged on. -Outpatient follow-up with GI.   Diabetes, type II, well-controlled Hemoglobin A1c noted at 5.5 during the hospitalization  - Patient's Jardiance  was held during the hospitalization patient maintained on sliding scale insulin .   - Jardiance  will be resumed on discharge.    Incidental R thyroid  nodule Noted on CT C spine TSH normal  - recommend outpatient thyroid  US    Chronic normocytic anemia - stable    Acute ambulatory dysfunction Mechanical fall PT/OT following- rec SNF given profound weakness from baseline. - Patient be discharged to skilled nursing facility.   GOC discussion Patient with poor prognosis, recurrent severe ileus, advanced CHF Palliative care consulted, and follow the patient during the hospitalization. - Outpatient follow-up with palliative care.  Procedures: CT abdomen and pelvis 04/21/2024 CT head 04/21/2024 CT C-spine 04/21/2024 Renal ultrasound 04/28/2024   Consultations: Palliative care, cardiology, pulmonology, GI, urology    Discharge Exam: Vitals:   05/11/24 0840 05/11/24 1212  BP:  119/82  Pulse:  75  Resp:  16  Temp:  98.7 F (37.1 C)  SpO2: 99% 96%    General: NAD Cardiovascular: RRR no murmurs rubs or gallops.  No JVD.  No lower extremity edema. Respiratory: Clear to auscultation bilaterally.  No wheezes, no crackles, no rhonchi.  Fair air movement.   Speaking in full sentences.  Discharge Instructions   Discharge Instructions     Diet - low sodium heart healthy   Complete by: As directed    Increase activity slowly   Complete by: As directed    No wound care   Complete by: As directed       Allergies as of 05/11/2024       Reactions   Codeine Nausea And Vomiting   Latex Rash   Oral blisters when used at dentist        Medication List     PAUSE taking these medications    digoxin  0.125 MG tablet Wait to take this until your doctor or other care provider tells you to start again. Commonly known as: LANOXIN  Take 1/2 tablet (0.0625 mg total) by mouth daily.   sacubitril -valsartan  24-26 MG Wait to take this until your doctor or other care provider tells you to start again. Commonly known as: Entresto  Take 1 tablet by mouth 2 (two) times daily.       TAKE these medications    amiodarone  200 MG tablet Commonly known as: PACERONE  Take 1 tablet (200 mg total) by mouth daily.   budesonide  0.25 MG/2ML nebulizer solution Commonly known as: PULMICORT  Take 2 mLs (0.25 mg total) by nebulization 2 (two) times daily.   CALCIUM  PO Take 500 mg by mouth daily.   clopidogrel  75 MG tablet Commonly known as: PLAVIX  Take 1 tablet (75 mg  total) by mouth daily.   cyanocobalamin  1000 MCG tablet Commonly known as: VITAMIN B12 Take 1,000 mcg by mouth daily.   Eliquis  5 MG Tabs tablet Generic drug: apixaban  Take 1 tablet (5 mg total) by mouth 2 (two) times daily.   empagliflozin  10 MG Tabs tablet Commonly known as: Jardiance  Take 1 tablet (10 mg total) by mouth daily before breakfast.   fiber supplement (BANATROL TF) liquid Take 60 mLs by mouth 2 (two) times daily.   furosemide  40 MG tablet Commonly known as: LASIX  Take 1 tablet (40 mg total) by mouth daily. What changed: when to take this   Gerhardt's butt cream Crea Apply 1 Application topically 2 (two) times daily.   hydrOXYzine 25 MG tablet Commonly  known as: ATARAX Take 12.5-25 mg by mouth at bedtime as needed for anxiety.   ipratropium 0.02 % nebulizer solution Commonly known as: ATROVENT  Take 2.5 mLs (0.5 mg total) by nebulization 2 (two) times daily.   Multivitamin Men 50+ Tabs Take 1 tablet by mouth daily.   nitroGLYCERIN  0.4 MG SL tablet Commonly known as: NITROSTAT  Place 0.4 mg under the tongue every 5 (five) minutes as needed for chest pain.   ondansetron  8 MG tablet Commonly known as: ZOFRAN  Take 8 mg by mouth every 8 (eight) hours as needed for nausea or vomiting.   pantoprazole  40 MG tablet Commonly known as: PROTONIX  Take 1 tablet (40 mg total) by mouth daily.   polyethylene glycol 17 g packet Commonly known as: MIRALAX  / GLYCOLAX  Take 17 g by mouth daily. Start taking on: May 12, 2024   potassium chloride  SA 20 MEQ tablet Commonly known as: KLOR-CON  M Take 1 tablet (20 mEq total) by mouth 2 (two) times daily.   PROBIOTIC PO Take 1,000 mg by mouth daily. Chew   revefenacin  175 MCG/3ML nebulizer solution Commonly known as: YUPELRI  Take 3 mLs (175 mcg total) by nebulization daily. Start taking on: May 12, 2024   rosuvastatin  20 MG tablet Commonly known as: CRESTOR  TAKE 1 TABLET(20 MG) BY MOUTH DAILY   sertraline 50 MG tablet Commonly known as: ZOLOFT Take 50 mg by mouth daily.   spironolactone  25 MG tablet Commonly known as: ALDACTONE  Take 1 tablet (25 mg total) by mouth daily. What changed: how much to take   tamsulosin  0.4 MG Caps capsule Commonly known as: Flomax  Take 1 capsule (0.4 mg total) by mouth daily after supper.       Allergies[1]  Follow-up Information     MD at SNF Follow up.          Anner Alm ORN, MD. Schedule an appointment as soon as possible for a visit in 2 week(s).   Specialty: Cardiology Contact information: [address] [place] [address] [phone]         Stacia Glendia BRAVO, MD. Schedule an appointment as soon as possible  for a visit in 3 week(s).   Specialty: Gastroenterology Contact information: [address] [place] [address] [phone]         Nieves Cough, MD. Schedule an appointment as soon as possible for a visit in 4 week(s).   Specialty: Urology Contact information: [address] [place] [address] [phone]                  The results of significant diagnostics from this hospitalization (including imaging, microbiology, ancillary and laboratory) are listed below for reference.    Significant Diagnostic Studies: DG CHEST PORT 1 VIEW Result Date: 04/30/2024  EXAM: 1 VIEW(S) XRAY OF THE CHEST 04/30/2024 06:19:00 PM COMPARISON: 04/27/2024 CLINICAL HISTORY: Dyspnea FINDINGS: LUNGS AND PLEURA: Stable elevation of the right hemidiaphragm. Patchy bilateral perihilar pulmonary infiltrates are again identified, likely infectious or inflammatory in the acute setting. No pneumothorax or pleural effusion. HEART AND MEDIASTINUM: Cardiac size within normal limits. BONES AND SOFT TISSUES: Advanced degenerative changes seen within the right shoulder. No acute bone abnormality. IMPRESSION: 1. Patchy bilateral perihilar pulmonary infiltrates, likely infectious or inflammatory in the acute setting. 2. Stable elevation of the right hemidiaphragm. 3. Advanced degenerative changes of the right shoulder. Electronically signed by: Dorethia Molt MD 04/30/2024 08:23 PM EST RP Workstation: [id number]   US  RENAL Result Date: 04/28/2024 EXAM: US  Retroperitoneum Complete, Renal. 04/28/2024 01:10:00 PM TECHNIQUE: Real-time ultrasonography of the retroperitoneum renal was performed. COMPARISON: US  Renal 12/22/2006 and CT abdomen performed 04/21/2024. CLINICAL HISTORY: Hematuria; Difficulty voiding. FINDINGS: FINDINGS: RIGHT KIDNEY/URETER: Right kidney measures 10.7 x 6.9 x 5.6 cm. Normal cortical echogenicity. No hydronephrosis. No calculus. No mass. Exophytic cyst off the mid pole measures up to 2.9  cm and appears simple. Posterior right mid pole cyst measures up to 4.5 cm and appears simple. LEFT KIDNEY/URETER: Left kidney measures 13.7 x 8.5 x 4.6 cm. Normal cortical echogenicity. No hydronephrosis. No calculus. No mass. 2 small left renal cysts appear benign, and measure up to 2 cm. BLADDER: Enlarged prostate protruding into the inferior bladder. Layering debris in the bladder could reflect blood clot. IMPRESSION: 1. Layering debris in the bladder possibly representing blood clot. 2. Enlarged prostate protruding into the inferior bladder. Electronically signed by: Franky Crease MD 04/28/2024 05:14 PM EST RP Workstation: [id number]   DG Abd 1 View Result Date: 04/28/2024 CLINICAL DATA:  Abdominal distension.  Ileus. EXAM: ABDOMEN - 1 VIEW COMPARISON:  Radiograph 04/26/2024, CT 04/21/2024 FINDINGS: Diffuse colonic distension, without significant change. Decreasing gaseous small bowel distension centrally. There is excreted IV contrast in the renal collecting systems and ureters. No obvious free air. IMPRESSION: Diffuse colonic distension, without significant change. Decreasing gaseous small bowel distension centrally. Electronically Signed   By: Andrea Gasman M.D.   On: 04/28/2024 13:12   CT Angio Chest Pulmonary Embolism (PE) W or WO Contrast Result Date: 04/28/2024 EXAM: CTA of the Chest with contrast for PE 04/28/2024 12:16:08 PM TECHNIQUE: CTA of the chest was performed after the administration of 75 mL iohexol  (OMNIPAQUE ) 350 MG/ML injection. Multiplanar reformatted images are provided for review. MIP images are provided for review. Automated exposure control, iterative reconstruction, and/or weight based adjustment of the mA/kV was utilized to reduce the radiation dose to as low as reasonably achievable. COMPARISON: 01/22/2024 CLINICAL HISTORY: Pulmonary embolism (PE) suspected, low to intermediate probability, positive D-dimer; Rule out pneumonia. FINDINGS: PULMONARY ARTERIES: Pulmonary arteries  are adequately opacified for evaluation. No filling defect in the pulmonary arterial tree to suggest pulmonary embolus. Main pulmonary artery is normal in caliber. MEDIASTINUM: Prominent cardiomegaly. Thoracic aortic, coronary artery, and branch vessel atheromatous vascular calcifications. Stable hypodense thyroid  nodules up to 1.4 cm in long axis. No further imaging workup is indicated. LYMPH NODES: No mediastinal, hilar or axillary lymphadenopathy. LUNGS AND PLEURA: Mucus in the right mainstem bronchus. New and increased patch of ground glass opacities in both lungs with band-like components in the lobes of the right lung and in the left upper lobe volume loss most striking in the right lower lobe. Mild airway thickening noted bilaterally. The ground glass opacities are mostly confined to secondary pulmonary lobules or combination of secondary pulmonary  lobules. Differential diagnostic considerations for this include atypical pneumonia, acute eosinophilic pneumonia, acute hypersensitivity pneumonitis, pulmonary edema, or pulmonary hemorrhage. No pleural effusion or pneumothorax. UPPER ABDOMEN: Osseous distention of colon in the upper abdomen with air-fluid level. SOFT TISSUES AND BONES: Bilateral gynecomastia. Severe right and moderate left degenerative glenohumeral arthropathy. IMPRESSION: 1. No evidence of pulmonary embolism. 2. New and increased bilateral ground glass opacities with band-like components, associated volume loss greatest in the right lower lobe, and mild bilateral airway thickening, which may reflect atypical pneumonia, acute eosinophilic pneumonia, acute hypersensitivity pneumonitis, pulmonary edema, or pulmonary hemorrhage. 3. Severe right and moderate left glenohumeral degenerative arthropathy. 4. Several additional chronic and incidental findings are present, including stable thyroid  nodules up to 1.4 cm without further imaging recommended, atherosclerotic calcifications of the thoracic aorta  and coronary and branch vessels, cardiomegaly, mucus in the right mainstem bronchus, bilateral gynecomastia, and colonic distention with air-fluid level. Electronically signed by: Ryan Salvage MD 04/28/2024 12:51 PM EST RP Workstation: [id number]   DG CHEST PORT 1 VIEW Result Date: 04/27/2024 EXAM: 1 VIEW(S) XRAY OF THE CHEST 04/27/2024 01:29:11 PM COMPARISON: 04/25/2024 CLINICAL HISTORY: Dyspnea FINDINGS: LUNGS AND PLEURA: Low lung volumes. Diffuse interstitial and airspace opacities. Elevated right hemidiaphragm. No pleural effusion. No pneumothorax. HEART AND MEDIASTINUM: No acute abnormality of the cardiac and mediastinal silhouettes. BONES AND SOFT TISSUES: Degenerative changes of right shoulder. IMPRESSION: 1. Unchanged interstitial and airspace opacities. 2. Low lung volumes. 3. Elevated right hemidiaphragm. Electronically signed by: Franky Stanford MD 04/27/2024 08:07 PM EST RP Workstation: [id number]   DG Abd 1 View Result Date: 04/26/2024 CLINICAL DATA:  Ileus. EXAM: ABDOMEN - 1 VIEW COMPARISON:  04/25/2024 FINDINGS: Diffuse ileus involving colon and small bowel appears stable. It is difficult to accurately measure the cecum on the current study. No signs of free air. IMPRESSION: Stable diffuse ileus involving colon and small bowel. Electronically Signed   By: Marcey Moan M.D.   On: 04/26/2024 09:14   DG Abd 1 View Result Date: 04/25/2024 CLINICAL DATA:  Abdominal distension.  Ileus. EXAM: ABDOMEN - 1 VIEW COMPARISON:  04/23/2024. FINDINGS: Persistent air-filled mildly dilated colonic loops with the ascending colon measuring 9.3 cm (previously 9.7 cm). Several air-filled dilated central small bowel loops with slight increased number of dilated small bowel loops measuring up to 4.7 cm in diameter which is not significantly changed. No free peritoneal air. Remainder the exam is unchanged. IMPRESSION: Persistent air-filled mildly dilated colonic loops with slight increased number of dilated  small bowel loops measuring up to 4.7 cm in diameter. Findings likely due to ileus. Electronically Signed   By: Toribio Agreste M.D.   On: 04/25/2024 14:56   DG Chest Port 1 View Result Date: 04/25/2024 EXAM: 1 VIEW(S) XRAY OF THE CHEST 04/25/2024 06:25:00 AM COMPARISON: 04/21/2024 CLINICAL HISTORY: SOB (shortness of breath) FINDINGS: LUNGS AND PLEURA: Low lung volumes. Elevated right hemidiaphragm, unchanged. Increasing bilateral opacities. No pleural effusion. No pneumothorax. HEART AND MEDIASTINUM: Cardiomegaly, unchanged. BONES AND SOFT TISSUES: Degenerative changes of right shoulder. IMPRESSION: 1. Increasing bilateral opacities. 2. Cardiomegaly, unchanged. 3. Low lung volumes and elevated right hemidiaphragm, unchanged. Electronically signed by: Waddell Calk MD 04/25/2024 06:32 AM EST RP Workstation: [id number]   DG Abd 1 View Result Date: 04/23/2024 CLINICAL DATA:  Abdominal distension/ileus. EXAM: ABDOMEN - 1 VIEW COMPARISON:  KUB 02/20/2024, CT 04/21/2024 FINDINGS: Exam demonstrates continued evidence of multiple air-filled prominent colonic loops measuring up to 9.7 cm in diameter over the ascending colon as these findings are not significantly  changed. Several air-filled mildly dilated distal small bowel loops measuring up to 4.7 cm slightly more prominent than the recent CT. No free peritoneal air. Remainder of the exam is unchanged. IMPRESSION: Findings suggesting persistent colonic ileus. Slight increase in distal small bowel dilatation which may be due to ileus versus early obstruction. Electronically Signed   By: Toribio Agreste M.D.   On: 04/23/2024 11:53   CT ABDOMEN PELVIS WO CONTRAST Result Date: 04/21/2024 CLINICAL DATA:  Bowel obstruction suspected. EXAM: CT ABDOMEN AND PELVIS WITHOUT CONTRAST TECHNIQUE: Multidetector CT imaging of the abdomen and pelvis was performed following the standard protocol without IV contrast. RADIATION DOSE REDUCTION: This exam was performed according to the  departmental dose-optimization program which includes automated exposure control, adjustment of the mA and/or kV according to patient size and/or use of iterative reconstruction technique. COMPARISON:  CT abdomen and pelvis 02/14/2024. FINDINGS: Lower chest: No acute abnormality. Hepatobiliary: No focal liver abnormality is seen. No gallstones, gallbladder wall thickening, or biliary dilatation. Pancreas: Unremarkable. No pancreatic ductal dilatation or surrounding inflammatory changes. Spleen: Normal in size without focal abnormality. Adrenals/Urinary Tract: Low-density right adrenal nodule measures 12 Hounsfield units and appears unchanged (10 mm). The left adrenal gland is within normal limits. There is a single 3 mm calculus in the right kidney. Bilateral renal cysts appear unchanged. Subcentimeter rounded hyperdensity in the left cyst is also unchanged, possibly a complex cyst. There is no hydronephrosis. Anterior right bladder diverticulum present. Stomach/Bowel: There is diffuse mild dilatation of the colon without definitive transition point. Air-fluid levels are seen throughout the colon. There is no wall thickening or surrounding inflammation. There is no pneumatosis or free air. The appendix is not seen. Additionally there is fluid distention of ileal loops. There is a moderate air-fluid level in the stomach. There is sigmoid colon diverticulosis. Vascular/Lymphatic: Aortic atherosclerosis. No enlarged abdominal or pelvic lymph nodes. Reproductive: The prostate gland is enlarged. Radiotherapy seeds are present. There is nodular protrusion into the bladder base which is rounded measuring 19 mm as seen on prior. Other: Fat containing umbilical hernia is unchanged.  No ascites. Musculoskeletal: No fracture is seen. IMPRESSION: 1. Diffuse mild dilatation of the colon with air-fluid levels. No definitive transition point. Findings may be related to ileus or colitis. 2. Fluid distention of ileal loops and  stomach. 3. Nonobstructing right renal calculus. 4. Stable right adrenal nodule, likely an adenoma. 5. Stable bilateral renal cysts. 6. Stable nodular protrusion of the prostate gland into the bladder base. 7. Aortic atherosclerosis. Aortic Atherosclerosis (ICD10-I70.0). Electronically Signed   By: Greig Pique M.D.   On: 04/21/2024 21:25   DG Chest Port 1 View Result Date: 04/21/2024 CLINICAL DATA:  Shortness of breath. EXAM: PORTABLE CHEST 1 VIEW COMPARISON:  February 08, 2024 FINDINGS: The left-sided PICC line seen on the prior study has been removed. The cardiac silhouette is mildly enlarged and unchanged in size. Low lung volumes are noted. There is mild right perihilar linear scarring and/or atelectasis. No acute infiltrate, pleural effusion or pneumothorax is identified. Air-filled loops of large bowel is seen within the visualized portion of the upper abdomen. Marked severity degenerative changes are seen involving the right shoulder. The visualized skeletal structures are otherwise unremarkable. IMPRESSION: Low lung volumes with mild right perihilar linear scarring and/or atelectasis. Electronically Signed   By: Suzen Dials M.D.   On: 04/21/2024 19:58   CT Cervical Spine Wo Contrast Result Date: 04/21/2024 EXAM: CT CERVICAL SPINE WITHOUT CONTRAST 04/21/2024 07:45:03 PM TECHNIQUE: CT of the  cervical spine was performed without the administration of intravenous contrast. Multiplanar reformatted images are provided for review. Automated exposure control, iterative reconstruction, and/or weight based adjustment of the mA/kV was utilized to reduce the radiation dose to as low as reasonably achievable. COMPARISON: None available. CLINICAL HISTORY: Neck trauma (Age >= 65y) FINDINGS: CERVICAL SPINE: BONES AND ALIGNMENT: No acute fracture or traumatic malalignment. DEGENERATIVE CHANGES: Moderate disc space height loss and degenerative endplate changes at C3-C4, C4-C5, and C5-C6. Posterior disc  osteophyte complexes at these levels causing mild effacement of the ventral thecal sac. No severe spinal canal narrowing. SOFT TISSUES: No prevertebral soft tissue swelling. Right thyroid  nodules measuring up to 1.6 cm. IMPRESSION: 1. No acute abnormality of the cervical spine . 2. Incidental right thyroid  nodule measuring up to 1.6 cm. Recommend non-emergent thyroid  ultrasound per ACR guidelines. Electronically signed by: Norman Gatlin MD 04/21/2024 07:57 PM EST RP Workstation: [id number]   CT Head Wo Contrast Result Date: 04/21/2024 EXAM: CT HEAD WITHOUT 04/21/2024 07:45:03 PM TECHNIQUE: CT of the head was performed without the administration of intravenous contrast. Automated exposure control, iterative reconstruction, and/or weight based adjustment of the mA/kV was utilized to reduce the radiation dose to as low as reasonably achievable. COMPARISON: 12/ 29 / 11. CLINICAL HISTORY: Head trauma, minor (Age >= 65y) FINDINGS: BRAIN AND VENTRICLES: No acute intracranial hemorrhage. No mass effect or midline shift. No extra-axial fluid collection. No evidence of acute infarct. No hydrocephalus. 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 Mild periventricular white matter disease. Moderate calcific atheromatous disease within carotid siphons and vertebral arteries. ORBITS: No acute abnormality. Bilateral lens replacement noted. SINUSES AND MASTOIDS: No acute abnormality. SOFT TISSUES AND SKULL: No acute skull fracture. No acute soft tissue abnormality. IMPRESSION: 1. No acute intracranial abnormality. Electronically signed by: Norman Gatlin MD 04/21/2024 07:54 PM EST RP Workstation: [id number]    Microbiology: No results found for this or any previous visit (from the past 240 hours).   Labs: Basic Metabolic Panel: Recent Labs  Lab 05/06/24 0610 05/09/24 0611 05/10/24 1206 05/11/24 0512  NA 136 139 138 140  K 2.4* 2.4* 3.2* 3.0*  CL 98 103 103 105  CO2 30 30 26 30   GLUCOSE 100* 101* 88 103*  BUN  8 7* 9 7*  CREATININE 0.74 0.62 0.75 0.74  CALCIUM  8.0* 8.0* 8.5* 8.3*   Liver Function Tests: No results for input(s): AST, ALT, ALKPHOS, BILITOT, PROT, ALBUMIN in the last 168 hours. No results for input(s): LIPASE, AMYLASE in the last 168 hours. No results for input(s): AMMONIA in the last 168 hours. CBC: Recent Labs  Lab 05/06/24 0610 05/09/24 0611  WBC 3.9* 4.3  HGB 10.3* 11.4*  HCT 31.1* 34.3*  MCV 91.2 91.2  PLT 364 428*   Cardiac Enzymes: No results for input(s): CKTOTAL, CKMB, CKMBINDEX, TROPONINI in the last 168 hours. BNP: BNP (last 3 results) Recent Labs    04/22/24 0607 04/25/24 2339 04/30/24 0858  BNP 844.7* 1,984.7* 1,107.1*    ProBNP (last 3 results) Recent Labs    01/07/24 2301  PROBNP 15,245.0*    CBG: Recent Labs  Lab 05/10/24 0334 05/10/24 0747 05/10/24 1145 05/10/24 1644 05/10/24 1952  GLUCAP 98 96 89 99 101*       Signed:  Toribio Hummer MD.  [facility] 05/11/2024, 1:22 PM       [1]  Allergies Allergen Reactions   Codeine Nausea And Vomiting   Latex Rash    Oral blisters when used at dentist

## 2024-05-12 ENCOUNTER — Encounter: Payer: Self-pay | Admitting: Cardiology

## 2024-05-19 ENCOUNTER — Encounter (HOSPITAL_COMMUNITY): Payer: Self-pay | Admitting: Family Medicine

## 2024-05-19 ENCOUNTER — Emergency Department (HOSPITAL_COMMUNITY)

## 2024-05-19 ENCOUNTER — Inpatient Hospital Stay (HOSPITAL_COMMUNITY)
Admission: EM | Admit: 2024-05-19 | Discharge: 2024-06-01 | DRG: 392 | Disposition: A | Discharge status: Home / Self Care | Source: Skilled Nursing Facility | Attending: Internal Medicine | Admitting: Internal Medicine

## 2024-05-19 ENCOUNTER — Other Ambulatory Visit: Payer: Self-pay

## 2024-05-19 DIAGNOSIS — E119 Type 2 diabetes mellitus without complications: Secondary | ICD-10-CM | POA: Diagnosis not present

## 2024-05-19 DIAGNOSIS — I255 Ischemic cardiomyopathy: Secondary | ICD-10-CM | POA: Diagnosis present

## 2024-05-19 DIAGNOSIS — Z7902 Long term (current) use of antithrombotics/antiplatelets: Secondary | ICD-10-CM | POA: Diagnosis not present

## 2024-05-19 DIAGNOSIS — I4819 Other persistent atrial fibrillation: Secondary | ICD-10-CM | POA: Diagnosis present

## 2024-05-19 DIAGNOSIS — E1122 Type 2 diabetes mellitus with diabetic chronic kidney disease: Secondary | ICD-10-CM | POA: Diagnosis present

## 2024-05-19 DIAGNOSIS — I2583 Coronary atherosclerosis due to lipid rich plaque: Secondary | ICD-10-CM | POA: Diagnosis present

## 2024-05-19 DIAGNOSIS — N289 Disorder of kidney and ureter, unspecified: Secondary | ICD-10-CM

## 2024-05-19 DIAGNOSIS — Z8616 Personal history of COVID-19: Secondary | ICD-10-CM | POA: Diagnosis not present

## 2024-05-19 DIAGNOSIS — E876 Hypokalemia: Secondary | ICD-10-CM | POA: Diagnosis present

## 2024-05-19 DIAGNOSIS — Z79899 Other long term (current) drug therapy: Secondary | ICD-10-CM

## 2024-05-19 DIAGNOSIS — I5082 Biventricular heart failure: Secondary | ICD-10-CM | POA: Diagnosis present

## 2024-05-19 DIAGNOSIS — E871 Hypo-osmolality and hyponatremia: Secondary | ICD-10-CM | POA: Diagnosis present

## 2024-05-19 DIAGNOSIS — I4891 Unspecified atrial fibrillation: Secondary | ICD-10-CM | POA: Diagnosis not present

## 2024-05-19 DIAGNOSIS — I4892 Unspecified atrial flutter: Secondary | ICD-10-CM | POA: Diagnosis present

## 2024-05-19 DIAGNOSIS — Z885 Allergy status to narcotic agent status: Secondary | ICD-10-CM

## 2024-05-19 DIAGNOSIS — N179 Acute kidney failure, unspecified: Secondary | ICD-10-CM | POA: Diagnosis present

## 2024-05-19 DIAGNOSIS — I951 Orthostatic hypotension: Secondary | ICD-10-CM | POA: Diagnosis present

## 2024-05-19 DIAGNOSIS — F419 Anxiety disorder, unspecified: Secondary | ICD-10-CM | POA: Diagnosis present

## 2024-05-19 DIAGNOSIS — E042 Nontoxic multinodular goiter: Secondary | ICD-10-CM | POA: Diagnosis present

## 2024-05-19 DIAGNOSIS — I358 Other nonrheumatic aortic valve disorders: Secondary | ICD-10-CM | POA: Diagnosis present

## 2024-05-19 DIAGNOSIS — E785 Hyperlipidemia, unspecified: Secondary | ICD-10-CM | POA: Diagnosis present

## 2024-05-19 DIAGNOSIS — K279 Peptic ulcer, site unspecified, unspecified as acute or chronic, without hemorrhage or perforation: Secondary | ICD-10-CM | POA: Diagnosis present

## 2024-05-19 DIAGNOSIS — M19041 Primary osteoarthritis, right hand: Secondary | ICD-10-CM | POA: Diagnosis present

## 2024-05-19 DIAGNOSIS — F32A Depression, unspecified: Secondary | ICD-10-CM | POA: Diagnosis present

## 2024-05-19 DIAGNOSIS — E861 Hypovolemia: Secondary | ICD-10-CM | POA: Diagnosis present

## 2024-05-19 DIAGNOSIS — Z7901 Long term (current) use of anticoagulants: Secondary | ICD-10-CM | POA: Diagnosis not present

## 2024-05-19 DIAGNOSIS — I130 Hypertensive heart and chronic kidney disease with heart failure and stage 1 through stage 4 chronic kidney disease, or unspecified chronic kidney disease: Secondary | ICD-10-CM | POA: Diagnosis present

## 2024-05-19 DIAGNOSIS — K56609 Unspecified intestinal obstruction, unspecified as to partial versus complete obstruction: Secondary | ICD-10-CM

## 2024-05-19 DIAGNOSIS — Z955 Presence of coronary angioplasty implant and graft: Secondary | ICD-10-CM

## 2024-05-19 DIAGNOSIS — I493 Ventricular premature depolarization: Secondary | ICD-10-CM | POA: Diagnosis present

## 2024-05-19 DIAGNOSIS — R339 Retention of urine, unspecified: Secondary | ICD-10-CM | POA: Diagnosis present

## 2024-05-19 DIAGNOSIS — R112 Nausea with vomiting, unspecified: Secondary | ICD-10-CM

## 2024-05-19 DIAGNOSIS — Z8711 Personal history of peptic ulcer disease: Secondary | ICD-10-CM

## 2024-05-19 DIAGNOSIS — K5981 Ogilvie syndrome: Secondary | ICD-10-CM | POA: Diagnosis present

## 2024-05-19 DIAGNOSIS — R54 Age-related physical debility: Secondary | ICD-10-CM | POA: Diagnosis present

## 2024-05-19 DIAGNOSIS — N139 Obstructive and reflux uropathy, unspecified: Secondary | ICD-10-CM | POA: Diagnosis present

## 2024-05-19 DIAGNOSIS — Z66 Do not resuscitate: Secondary | ICD-10-CM | POA: Diagnosis present

## 2024-05-19 DIAGNOSIS — R197 Diarrhea, unspecified: Secondary | ICD-10-CM | POA: Diagnosis not present

## 2024-05-19 DIAGNOSIS — R079 Chest pain, unspecified: Secondary | ICD-10-CM

## 2024-05-19 DIAGNOSIS — Z7984 Long term (current) use of oral hypoglycemic drugs: Secondary | ICD-10-CM

## 2024-05-19 DIAGNOSIS — I5042 Chronic combined systolic (congestive) and diastolic (congestive) heart failure: Secondary | ICD-10-CM | POA: Diagnosis present

## 2024-05-19 DIAGNOSIS — Z1152 Encounter for screening for COVID-19: Secondary | ICD-10-CM | POA: Diagnosis not present

## 2024-05-19 DIAGNOSIS — Z8619 Personal history of other infectious and parasitic diseases: Secondary | ICD-10-CM

## 2024-05-19 DIAGNOSIS — N400 Enlarged prostate without lower urinary tract symptoms: Secondary | ICD-10-CM | POA: Diagnosis present

## 2024-05-19 DIAGNOSIS — I252 Old myocardial infarction: Secondary | ICD-10-CM

## 2024-05-19 DIAGNOSIS — G4733 Obstructive sleep apnea (adult) (pediatric): Secondary | ICD-10-CM | POA: Diagnosis present

## 2024-05-19 DIAGNOSIS — I2510 Atherosclerotic heart disease of native coronary artery without angina pectoris: Secondary | ICD-10-CM | POA: Diagnosis not present

## 2024-05-19 DIAGNOSIS — E041 Nontoxic single thyroid nodule: Secondary | ICD-10-CM | POA: Diagnosis present

## 2024-05-19 DIAGNOSIS — M19042 Primary osteoarthritis, left hand: Secondary | ICD-10-CM | POA: Diagnosis present

## 2024-05-19 DIAGNOSIS — R140 Abdominal distension (gaseous): Secondary | ICD-10-CM | POA: Diagnosis not present

## 2024-05-19 DIAGNOSIS — E875 Hyperkalemia: Secondary | ICD-10-CM | POA: Diagnosis not present

## 2024-05-19 DIAGNOSIS — E872 Acidosis, unspecified: Secondary | ICD-10-CM | POA: Diagnosis present

## 2024-05-19 DIAGNOSIS — Z91040 Latex allergy status: Secondary | ICD-10-CM

## 2024-05-19 DIAGNOSIS — C61 Malignant neoplasm of prostate: Secondary | ICD-10-CM

## 2024-05-19 DIAGNOSIS — I5022 Chronic systolic (congestive) heart failure: Secondary | ICD-10-CM | POA: Diagnosis not present

## 2024-05-19 DIAGNOSIS — D75839 Thrombocytosis, unspecified: Secondary | ICD-10-CM | POA: Diagnosis present

## 2024-05-19 DIAGNOSIS — Z96652 Presence of left artificial knee joint: Secondary | ICD-10-CM | POA: Diagnosis present

## 2024-05-19 DIAGNOSIS — Z7951 Long term (current) use of inhaled steroids: Secondary | ICD-10-CM

## 2024-05-19 DIAGNOSIS — K567 Ileus, unspecified: Secondary | ICD-10-CM | POA: Diagnosis present

## 2024-05-19 DIAGNOSIS — R059 Cough, unspecified: Secondary | ICD-10-CM | POA: Diagnosis not present

## 2024-05-19 DIAGNOSIS — Z8719 Personal history of other diseases of the digestive system: Secondary | ICD-10-CM

## 2024-05-19 DIAGNOSIS — R0602 Shortness of breath: Secondary | ICD-10-CM

## 2024-05-19 DIAGNOSIS — Z8546 Personal history of malignant neoplasm of prostate: Secondary | ICD-10-CM

## 2024-05-19 DIAGNOSIS — E7800 Pure hypercholesterolemia, unspecified: Secondary | ICD-10-CM | POA: Diagnosis not present

## 2024-05-19 DIAGNOSIS — N182 Chronic kidney disease, stage 2 (mild): Secondary | ICD-10-CM | POA: Diagnosis not present

## 2024-05-19 DIAGNOSIS — R159 Full incontinence of feces: Secondary | ICD-10-CM | POA: Diagnosis not present

## 2024-05-19 DIAGNOSIS — G4730 Sleep apnea, unspecified: Secondary | ICD-10-CM | POA: Diagnosis not present

## 2024-05-19 DIAGNOSIS — R109 Unspecified abdominal pain: Secondary | ICD-10-CM | POA: Diagnosis not present

## 2024-05-19 DIAGNOSIS — E869 Volume depletion, unspecified: Secondary | ICD-10-CM | POA: Diagnosis present

## 2024-05-19 DIAGNOSIS — N401 Enlarged prostate with lower urinary tract symptoms: Secondary | ICD-10-CM | POA: Diagnosis present

## 2024-05-19 DIAGNOSIS — K5989 Other specified functional intestinal disorders: Secondary | ICD-10-CM | POA: Diagnosis not present

## 2024-05-19 DIAGNOSIS — I959 Hypotension, unspecified: Secondary | ICD-10-CM | POA: Diagnosis not present

## 2024-05-19 HISTORY — DX: Type 2 diabetes mellitus without complications: E11.9

## 2024-05-19 LAB — CBC WITH DIFFERENTIAL/PLATELET
Basophils Absolute: 0 K/uL (ref 0.0–0.1)
Basophils Relative: 0 %
Eosinophils Absolute: 0 K/uL (ref 0.0–0.5)
Eosinophils Relative: 0 %
HCT: 41.2 % (ref 39.0–52.0)
Hemoglobin: 13.8 g/dL (ref 13.0–17.0)
Lymphocytes Relative: 15 %
Lymphs Abs: 0.8 K/uL (ref 0.7–4.0)
MCH: 30.6 pg (ref 26.0–34.0)
MCHC: 33.5 g/dL (ref 30.0–36.0)
MCV: 91.4 fL (ref 80.0–100.0)
Monocytes Absolute: 0.7 K/uL (ref 0.1–1.0)
Monocytes Relative: 13 %
Neutro Abs: 4 K/uL (ref 1.7–7.7)
Neutrophils Relative %: 72 %
Platelets: 480 K/uL — ABNORMAL HIGH (ref 150–400)
RBC: 4.51 MIL/uL (ref 4.22–5.81)
RDW: 17.6 % — ABNORMAL HIGH (ref 11.5–15.5)
WBC: 5.6 K/uL (ref 4.0–10.5)
nRBC: 0 % (ref 0.0–0.2)

## 2024-05-19 LAB — I-STAT CHEM 8, ED
BUN: 70 mg/dL — ABNORMAL HIGH (ref 8–23)
Calcium, Ion: 0.96 mmol/L — ABNORMAL LOW (ref 1.15–1.40)
Chloride: 92 mmol/L — ABNORMAL LOW (ref 98–111)
Creatinine, Ser: 2.4 mg/dL — ABNORMAL HIGH (ref 0.61–1.24)
Glucose, Bld: 75 mg/dL (ref 70–99)
HCT: 42 % (ref 39.0–52.0)
Hemoglobin: 14.3 g/dL (ref 13.0–17.0)
Potassium: 3.2 mmol/L — ABNORMAL LOW (ref 3.5–5.1)
Sodium: 130 mmol/L — ABNORMAL LOW (ref 135–145)
TCO2: 25 mmol/L (ref 22–32)

## 2024-05-19 LAB — RESP PANEL BY RT-PCR (RSV, FLU A&B, COVID)  RVPGX2
Influenza A by PCR: NEGATIVE
Influenza B by PCR: NEGATIVE
Resp Syncytial Virus by PCR: NEGATIVE
SARS Coronavirus 2 by RT PCR: NEGATIVE

## 2024-05-19 LAB — COMPREHENSIVE METABOLIC PANEL WITH GFR
ALT: 57 U/L — ABNORMAL HIGH (ref 0–44)
AST: 31 U/L (ref 15–41)
Albumin: 2.9 g/dL — ABNORMAL LOW (ref 3.5–5.0)
Alkaline Phosphatase: 106 U/L (ref 38–126)
Anion gap: 16 — ABNORMAL HIGH (ref 5–15)
BUN: 67 mg/dL — ABNORMAL HIGH (ref 8–23)
CO2: 22 mmol/L (ref 22–32)
Calcium: 8.2 mg/dL — ABNORMAL LOW (ref 8.9–10.3)
Chloride: 91 mmol/L — ABNORMAL LOW (ref 98–111)
Creatinine, Ser: 1.96 mg/dL — ABNORMAL HIGH (ref 0.61–1.24)
GFR, Estimated: 35 mL/min — ABNORMAL LOW
Glucose, Bld: 70 mg/dL (ref 70–99)
Potassium: 3.4 mmol/L — ABNORMAL LOW (ref 3.5–5.1)
Sodium: 128 mmol/L — ABNORMAL LOW (ref 135–145)
Total Bilirubin: 0.4 mg/dL (ref 0.0–1.2)
Total Protein: 5.6 g/dL — ABNORMAL LOW (ref 6.5–8.1)

## 2024-05-19 LAB — PROTIME-INR
INR: 2 — ABNORMAL HIGH (ref 0.8–1.2)
Prothrombin Time: 23.6 s — ABNORMAL HIGH (ref 11.4–15.2)

## 2024-05-19 LAB — PRO BRAIN NATRIURETIC PEPTIDE: Pro Brain Natriuretic Peptide: 10122 pg/mL — ABNORMAL HIGH

## 2024-05-19 LAB — LIPASE, BLOOD: Lipase: <10 U/L — ABNORMAL LOW (ref 11–51)

## 2024-05-19 LAB — APTT: aPTT: 39 s — ABNORMAL HIGH (ref 24–36)

## 2024-05-19 LAB — TROPONIN T, HIGH SENSITIVITY: Troponin T High Sensitivity: 49 ng/L — ABNORMAL HIGH (ref 0–19)

## 2024-05-19 LAB — MAGNESIUM: Magnesium: 1.8 mg/dL (ref 1.7–2.4)

## 2024-05-19 MED ORDER — PANTOPRAZOLE SODIUM 40 MG IV SOLR
40.0000 mg | INTRAVENOUS | Status: DC
  Administered 2024-05-19 – 2024-05-22 (×4): 40 mg via INTRAVENOUS
  Filled 2024-05-19 (×3): qty 10

## 2024-05-19 MED ORDER — POTASSIUM CHLORIDE 10 MEQ/100ML IV SOLN
10.0000 meq | INTRAVENOUS | Status: AC
  Administered 2024-05-19 (×2): 10 meq via INTRAVENOUS
  Filled 2024-05-19 (×2): qty 100

## 2024-05-19 MED ORDER — HEPARIN (PORCINE) 25000 UT/250ML-% IV SOLN
1450.0000 [IU]/h | INTRAVENOUS | Status: AC
  Administered 2024-05-19: 1000 [IU]/h via INTRAVENOUS
  Administered 2024-05-21: 1350 [IU]/h via INTRAVENOUS
  Administered 2024-05-21 – 2024-05-22 (×2): 1450 [IU]/h via INTRAVENOUS
  Filled 2024-05-19 (×3): qty 250

## 2024-05-19 MED ORDER — METOCLOPRAMIDE HCL 5 MG/ML IJ SOLN
5.0000 mg | Freq: Once | INTRAMUSCULAR | Status: AC
  Administered 2024-05-19: 5 mg via INTRAVENOUS
  Filled 2024-05-19: qty 2

## 2024-05-19 MED ORDER — LACTATED RINGERS IV BOLUS
500.0000 mL | Freq: Once | INTRAVENOUS | Status: AC
  Administered 2024-05-19: 500 mL via INTRAVENOUS

## 2024-05-19 MED ORDER — IPRATROPIUM BROMIDE 0.02 % IN SOLN
0.5000 mg | Freq: Two times a day (BID) | RESPIRATORY_TRACT | Status: DC
  Administered 2024-05-19 – 2024-05-22 (×6): 0.5 mg via RESPIRATORY_TRACT
  Filled 2024-05-19 (×5): qty 2.5

## 2024-05-19 MED ORDER — PROCHLORPERAZINE EDISYLATE 10 MG/2ML IJ SOLN
5.0000 mg | Freq: Four times a day (QID) | INTRAMUSCULAR | Status: DC | PRN
  Administered 2024-05-19 – 2024-06-01 (×7): 5 mg via INTRAVENOUS
  Filled 2024-05-19 (×4): qty 2

## 2024-05-19 MED ORDER — FENTANYL CITRATE (PF) 50 MCG/ML IJ SOSY
12.5000 ug | PREFILLED_SYRINGE | INTRAMUSCULAR | Status: DC | PRN
  Administered 2024-05-19 – 2024-05-23 (×2): 50 ug via INTRAVENOUS
  Administered 2024-05-25: 12.5 ug via INTRAVENOUS
  Administered 2024-05-25: 25 ug via INTRAVENOUS
  Administered 2024-05-29: 50 ug via INTRAVENOUS
  Filled 2024-05-19 (×2): qty 1

## 2024-05-19 MED ORDER — IPRATROPIUM-ALBUTEROL 0.5-2.5 (3) MG/3ML IN SOLN: 3.0000 mL | Freq: Four times a day (QID) | RESPIRATORY_TRACT | Status: DC | PRN

## 2024-05-19 MED ORDER — SODIUM CHLORIDE 0.9% FLUSH
3.0000 mL | Freq: Two times a day (BID) | INTRAVENOUS | Status: DC
  Administered 2024-05-19 – 2024-06-01 (×26): 3 mL via INTRAVENOUS

## 2024-05-19 MED ORDER — SODIUM CHLORIDE 0.9 % IV SOLN: INTRAVENOUS | Status: DC

## 2024-05-19 MED ORDER — MAGNESIUM SULFATE IN D5W 1-5 GM/100ML-% IV SOLN
1.0000 g | Freq: Once | INTRAVENOUS | Status: AC
  Administered 2024-05-19: 1 g via INTRAVENOUS
  Filled 2024-05-19: qty 100

## 2024-05-19 MED ORDER — BUDESONIDE 0.25 MG/2ML IN SUSP
0.2500 mg | Freq: Two times a day (BID) | RESPIRATORY_TRACT | Status: DC
  Administered 2024-05-19 – 2024-05-22 (×6): 0.25 mg via RESPIRATORY_TRACT
  Filled 2024-05-19 (×5): qty 2

## 2024-05-19 NOTE — ED Provider Notes (Signed)
 Patient received from previous provider please refer to previous note for full HPI.  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  He presents with approximately 24 hours of nausea vomiting diarrhea with increasing abdominal distention.  Patient denies shortness of breath, chest pain, dizziness, headache at this time.  Physical Exam  BP 103/63 (BP Location: Right Arm)   Pulse 97   Temp (!) 97.4 F (36.3 C) (Oral)   Resp 17   SpO2 94%   Physical Exam Vitals and nursing note reviewed.  Constitutional:      General: He is not in acute distress.    Appearance: He is well-developed. He is not ill-appearing or toxic-appearing.  HENT:     Head: Normocephalic and atraumatic.  Eyes:     General: No scleral icterus.    Extraocular Movements: Extraocular movements intact.     Pupils: Pupils are equal, round, and reactive to light.  Cardiovascular:     Rate and Rhythm: Normal rate.  Pulmonary:     Effort: Pulmonary effort is normal.     Breath sounds: Normal breath sounds.  Abdominal:     General: Abdomen is protuberant. Bowel sounds are normal. There is distension.     Tenderness: There is generalized abdominal tenderness.  Skin:    General: Skin is warm.     Capillary Refill: Capillary refill takes less than 2 seconds.  Neurological:     General: No focal deficit present.     Mental Status: He is alert.     Procedures  Procedures  ED Course / MDM     Initial evaluation patient sitting comfortably in ED bed in no acute distress at this time.  No active vomiting the patient denies nausea.  Abdomen is significantly distended.  Patient does have significant history of A-fib which was discussed with patient at bedside.  He reports he recently stopped blood thinners per his provider.  He reports approximately 24 hours without blood thinners.  CT chest abdomen pelvis was concerning for large bowel obstruction.  Lab  work is concerning for hyponatremia hypokalemia.  Dr. Avram was consulted with GI.  He reports this is a recurrence with his condition of a Ogilvie syndrome.  He advised to monitor potassium and sodium keep patient n.p.o. and place the NG tube and rectal tube.  They advised that they would consult with patient in the morning.  Hospitalist will be consulted for admission at this time.  Hospitalist agreed to admit patient at this time.       Myriam Fonda RAMAN, [place] 05/20/24 NANCYANN    Lenor Hollering, MD 05/21/24 [phone]

## 2024-05-19 NOTE — Progress Notes (Signed)
 ANTICOAGULATION CONSULT NOTE  Pharmacy Consult for Heparin  Indication: atrial fibrillation  Allergies[1]  Patient Measurements:   Heparin  Dosing Weight: 66 kg  Vital Signs: Temp: 97.4 F (36.3 C) (12/26 1713) Temp Source: Oral (12/26 1713) BP: 103/63 (12/26 1830) Pulse Rate: 97 (12/26 1830)  Labs: Recent Labs    05/19/24 1326 05/19/24 1418 05/19/24 1550 05/19/24 1632  HGB 13.8  --  14.3  --   HCT 41.2  --  42.0  --   PLT 480*  --   --   --   APTT  --  39*  --   --   LABPROT  --  23.6*  --   --   INR  --  2.0*  --   --   CREATININE  --   --  2.40* 1.96*    Estimated Creatinine Clearance: 29.9 mL/min (A) (by C-G formula based on SCr of 1.96 mg/dL (H)).   Medical History: Past Medical History:  Diagnosis Date   Abdominal hernia    per pt   Arthritis    hands   Benign localized prostatic hyperplasia with lower urinary tract symptoms (LUTS)    CAD (coronary artery disease) 05/2004   cardiologist--- dr anner;  positive stress test , had outpt cardiac cath 05-27-2004 stenosis RI;  06-18-2004 cath w/ PCI and BMS x1 to pRI;   STEMI 02-16-2009  s/p cath w/ PCI thrombectomy for total occluded RCA , BMS x1 to pRCA;  nuclear stress test 03-15-2012 low risk no ischemia but sig inferior-inferolateral infarct , ef 51%   Dyslipidemia    Essential hypertension    Heart murmur    History of COVID-19 05/2020   per pt mild symptoms that resolved   History of ST elevation myocardial infarction (STEMI) 02/16/2009   inferior STEMI   cath w/ pci w/ BMS to pRCA   History of urinary retention    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   urologist--- dr borden/ radiation oncology-- dr patrcia;  dx 12/ 2022,  Gleason 4+5, PSA 24.6, volume 94.6cc;  plan to start IMRT   OSA (obstructive sleep apnea)    per pt dx approx 2008, no cpap intolerant   PONV (postoperative nausea and vomiting)    Pre-diabetes    S/p bare metal coronary artery stent    01/ 2006  x1 BMS to pRI ([employer]  study stent);  and 09/ 2010  x1 BMS to pRCA   Status post total knee replacement, 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 Heparin  per pharmacy consult placed for atrial fibrillation.  Patient is on apixaban  prior to arrival. Last dose 12/25 pm or 12/26am patient is unsure. Will require aPTT monitoring due to likely falsely high anti-Xa level secondary to DOAC use.  Hgb 14.3; plt 480 aPTT 39 PT/INR 23.6/2  Goal of Therapy:  Heparin  level 0.3-0.7 units/ml aPTT 66-102 seconds Monitor platelets by anticoagulation protocol: Yes   Plan:  No initial heparin  bolus Start heparin  infusion at 1000 units/hr 10pm Check aPTT &  anti-Xa level in 8 hours and daily while on heparin  Continue to monitor via aPTT until levels are correlated Continue to monitor H&H and platelets  Dorn Buttner, PharmD, BCPS 05/19/2024 8:50 PM ED Clinical Pharmacist -  [phone]       [1]  Allergies Allergen Reactions   Codeine Nausea And Vomiting   Latex Rash    Oral blisters when used at dentist

## 2024-05-19 NOTE — ED Provider Notes (Signed)
 " [facility] Provider Note   CSN: [id number] Arrival date & time: 05/19/24  1256     Patient presents with: Abdominal Pain   Chase Combs is a 78 y.o. male.  Abdominal Pain Associated symptoms: chest pain, diarrhea, nausea, shortness of breath and vomiting   Patient is a 78 year old male presenting today for concerns for abdominal distention, nausea, vomiting, diarrhea that has been ongoing since last night, patient notes that he is at [facility].  With last p.o. intake last night and has been unable to tolerate p.o. since.  Noted to feel similar to when he was treated for ileus in the emergency department previously.  Additionally noting that he has also had some mild chest pain and shortness of breath on exertion.  Notes that is improved since arriving to the emergency department.  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   Denies fever, headache, vision changes, hemoptysis, melena, hematochezia, hematemesis, dysuria, hematuria, lower leg swelling.    Prior to Admission medications  Medication Sig Start Date End Date Taking? Authorizing Provider  amiodarone  (PACERONE ) 200 MG tablet Take 1 tablet (200 mg total) by mouth daily. 04/07/24   Milford, Harlene HERO, FNP  apixaban  (ELIQUIS ) 5 MG TABS tablet Take 1 tablet (5 mg total) by mouth 2 (two) times daily. 03/23/24   Colletta Manuelita Garre, PA-C  budesonide  (PULMICORT ) 0.25 MG/2ML nebulizer solution Take 2 mLs (0.25 mg total) by nebulization 2 (two) times daily. 05/11/24   Sebastian Toribio GAILS, MD  CALCIUM  PO Take 500 mg by mouth daily.    [provider]  clopidogrel  (PLAVIX ) 75 MG tablet Take 1 tablet (75 mg total) by mouth daily. 03/23/24   Colletta Manuelita Garre, PA-C  cyanocobalamin  (VITAMIN B12) 1000 MCG tablet Take 1,000 mcg by mouth daily.    [provider]   [Paused] digoxin  (LANOXIN ) 0.125 MG tablet Take 1/2 tablet (0.0625 mg total) by mouth daily. Wait to take this until your doctor or other care provider tells you to start again. 03/23/24   Colletta Manuelita Garre, PA-C  empagliflozin  (JARDIANCE ) 10 MG TABS tablet Take 1 tablet (10 mg total) by mouth daily before breakfast. 03/10/24   Lee, Jordan, NP  fiber supplement, BANATROL TF, liquid Take 60 mLs by mouth 2 (two) times daily. 05/11/24   Sebastian Toribio GAILS, MD  furosemide  (LASIX ) 40 MG tablet Take 1 tablet (40 mg total) by mouth daily. 05/11/24 08/09/24  Sebastian Toribio GAILS, MD  hydrOXYzine (ATARAX) 25 MG tablet Take 12.5-25 mg by mouth at bedtime as needed for anxiety. 03/27/24   [provider]  ipratropium (ATROVENT ) 0.02 % nebulizer solution Take 2.5 mLs (0.5 mg total) by nebulization 2 (two) times daily. 05/11/24   Sebastian Toribio GAILS, MD  Multiple Vitamins-Minerals (MULTIVITAMIN MEN 50+) TABS Take 1 tablet by mouth daily.    [provider]  nitroGLYCERIN  (NITROSTAT ) 0.4 MG SL tablet Place 0.4 mg under the tongue every 5 (five) minutes as needed for chest pain. 01/19/24   [provider]  Nystatin  (GERHARDT'S BUTT CREAM) CREA Apply 1 Application topically 2 (two) times daily. 05/11/24   Sebastian Toribio GAILS, MD  ondansetron  (ZOFRAN ) 8 MG tablet Take 8 mg by mouth every 8 (eight) hours as needed for nausea or vomiting. 02/29/24   [provider]  pantoprazole  (PROTONIX ) 40 MG tablet Take 1 tablet (40 mg total) by mouth daily. 02/24/24  Arrien, Mauricio Daniel, MD  polyethylene glycol (MIRALAX  / GLYCOLAX ) 17 g packet Take 17 g by mouth daily. 05/12/24   Sebastian Toribio GAILS, MD  potassium chloride  SA (KLOR-CON  M) 20 MEQ tablet Take 1 tablet (20 mEq total) by mouth 2 (two) times daily. 04/07/24   Glena Harlene HERO, FNP  Probiotic Product (PROBIOTIC PO) Take 1,000 mg by mouth daily. Chew    [provider]  revefenacin  (YUPELRI ) 175 MCG/3ML nebulizer solution  Take 3 mLs (175 mcg total) by nebulization daily. 05/12/24   Sebastian Toribio GAILS, MD  rosuvastatin  (CRESTOR ) 20 MG tablet TAKE 1 TABLET(20 MG) BY MOUTH DAILY 11/29/23   Anner Alm ORN, MD  [Paused] sacubitril -valsartan  (ENTRESTO ) 24-26 MG Take 1 tablet by mouth 2 (two) times daily. Wait to take this until your doctor or other care provider tells you to start again. 04/07/24   Glena Harlene HERO, FNP  sertraline (ZOLOFT) 50 MG tablet Take 50 mg by mouth daily.    [provider]  spironolactone  (ALDACTONE ) 25 MG tablet Take 1 tablet (25 mg total) by mouth daily. 05/11/24 08/09/24  Sebastian Toribio GAILS, MD  tamsulosin  (FLOMAX ) 0.4 MG CAPS capsule Take 1 capsule (0.4 mg total) by mouth daily after supper. 11/17/21   Patrcia Cough, MD    Allergies: Codeine and Latex    Review of Systems  Respiratory:  Positive for shortness of breath.   Cardiovascular:  Positive for chest pain.  Gastrointestinal:  Positive for abdominal distention, abdominal pain, diarrhea, nausea and vomiting.  All other systems reviewed and are negative.   Updated Vital Signs BP 98/66 (BP Location: Right Arm)   Pulse (!) 103   Temp 98.5 F (36.9 C) (Oral)   Resp 20   SpO2 96%   Physical Exam Vitals and nursing note reviewed.  Constitutional:      General: He is not in acute distress.    Appearance: Normal appearance. He is not ill-appearing or diaphoretic.  HENT:     Head: Normocephalic and atraumatic.  Eyes:     General: No scleral icterus.       Right eye: No discharge.        Left eye: No discharge.     Extraocular Movements: Extraocular movements intact.     Conjunctiva/sclera: Conjunctivae normal.  Cardiovascular:     Rate and Rhythm: Regular rhythm. Tachycardia present.     Pulses: Normal pulses.     Heart sounds: Normal heart sounds. No murmur heard.    No friction rub. No gallop.  Pulmonary:     Effort: Pulmonary effort is normal. No respiratory distress.     Breath sounds: No stridor. No  wheezing, rhonchi or rales.  Chest:     Chest wall: No tenderness.  Abdominal:     General: Abdomen is protuberant. There is distension.     Tenderness: There is generalized abdominal tenderness. There is no right CVA tenderness, left CVA tenderness, guarding or rebound.  Musculoskeletal:        General: No swelling, deformity or signs of injury.     Cervical back: Normal range of motion. No rigidity.     Right lower leg: No edema.     Left lower leg: No edema.  Skin:    General: Skin is warm and dry.     Findings: No bruising, erythema or lesion.  Neurological:     General: No focal deficit present.     Mental Status: He is alert and oriented to person, place, and time.  Mental status is at baseline.     Sensory: No sensory deficit.     Motor: No weakness.  Psychiatric:        Mood and Affect: Mood normal.     (all labs ordered are listed, but only abnormal results are displayed) Labs Reviewed  CBC WITH DIFFERENTIAL/PLATELET - Abnormal; Notable for the following components:      Result Value   RDW 17.6 (*)    Platelets 480 (*)    All other components within normal limits  PROTIME-INR - Abnormal; Notable for the following components:   Prothrombin Time 23.6 (*)    INR 2.0 (*)    All other components within normal limits  APTT - Abnormal; Notable for the following components:   aPTT 39 (*)    All other components within normal limits  RESP PANEL BY RT-PCR (RSV, FLU A&B, COVID)  RVPGX2  URINALYSIS, ROUTINE W REFLEX MICROSCOPIC  PRO BRAIN NATRIURETIC PEPTIDE  MAGNESIUM   COMPREHENSIVE METABOLIC PANEL WITH GFR  LIPASE, BLOOD  I-STAT CHEM 8, ED  TROPONIN T, HIGH SENSITIVITY    EKG: EKG Interpretation Date/Time:  Friday May 19 2024 13:58:13 EST Ventricular Rate:  96 PR Interval:  98 QRS Duration:  199 QT Interval:  446 QTC Calculation: 564 R Axis:   261  Text Interpretation: Sinus or ectopic atrial rhythm Short PR interval Consider right atrial enlargement RBBB  and LAFB Artifact in lead(s) I III aVL V4 and baseline wander in lead(s) V5 Confirmed by Towana Sharper [phone]) on 05/19/2024 2:39:38 PM  Radiology: DG Chest 2 View Result Date: 05/19/2024 CLINICAL DATA:  Shortness of breath, chest tightness EXAM: CHEST - 2 VIEW COMPARISON:  April 30, 2024 FINDINGS: Stable cardiomediastinal silhouette. Hypoinflation of the lungs. Stable right perihilar subsegmental atelectasis or scarring. Left lung is clear. IMPRESSION: Hypoinflation of the lungs with right perihilar subsegmental atelectasis or scarring. Electronically Signed   By: Lynwood Landy Raddle M.D.   On: 05/19/2024 14:27    Procedures   Medications Ordered in the ED  lactated ringers  bolus 500 mL (has no administration in time range)  metoCLOPramide  (REGLAN ) injection 5 mg (5 mg Intravenous Given 05/19/24 1411)    Medical Decision Making  This patient is a 78 year old male who presents to the ED for concern of abdominal ascension, nausea, vomiting, diarrhea from SNF facility with known history of Ogilvie syndrome.  Noted to also be endorsing shortness of breath, chest pain.    On physical exam, patient is in no acute distress, afebrile, alert and orient x 4, speaking in full sentences, nontachypneic.  Notably was borderline tachycardic with heart rate of high 90s additionally noting to have protuberant abdomen but his tympanic.  Patient currently anticoagulant Plavix  and Eliquis  however with patient shortness of breath and chest pain, history of cancer will still repeat PE study despite recent PE study in early December.  Additionally repeating abdominal scan for concerns for repeat obstruction with patient's diarrhea and protuberant abdomen.  Provide some fluids as well as empiric nausea medication with patient noting that his nausea is well-controlled at this time.  No repeat vomiting since, notably has not had fluids or food since last night.  Ordered some fluids.  Patient care transferred to [facility], PA-C pending labs and imaging, dissipate likely admission with GI consultation.  Differential diagnoses prior to evaluation: The emergent differential diagnosis includes, but is not limited to, ileus, SBO, LBO, malignancy, gastroenteritis, ACS, PE, AAS, pneumonia, URI. This is not an exhaustive differential.  Past Medical History / Co-morbidities / Social History: HTN, HLD, Ogilvie syndrome, prostate cancer, bare-metal coronary stent currently on Plavix  and Eliquis , arthritis, CAD, heart murmur, CHF, paroxysmal A-fib, type 2 diabetes  Additional history: Chart reviewed. Pertinent results include:   Discharge in the hospital on 05/11/2024 after being treated for progressive ileus/pseudoobstruction noting to have had presented with nausea, vomiting, diarrhea that was nonbloody/nonbilious and abdominal distention.  Last underwent colonic decompression on 9/26 2025 with improvement.  Noted to have been negative for PE after being treated for acute respiratory failure.  Last echo on 01/2024 with LVEF of 2025%.  Lab Tests/Imaging studies: I personally interpreted labs/imaging and the pertinent results include: CBC notes a thrombocytosis of 480 PT at INR and APTT all elevated.  23.6, 2.0, 39 respectively. Chest x-ray shows hypoinflation of the lungs with likely atelectasis versus scarring  Pending CT angio PE study, CT abdomen, UA, CMP, magnesium , lipase, Trope, respiratory panel, BNP  I agree with the radiologist interpretation.  Cardiac monitoring: EKG obtained and interpreted by myself and attending physician which shows: Sinus rhythm with short PR  EKG Interpretation Date/Time:  Friday May 19 2024 13:58:13 EST Ventricular Rate:  96 PR Interval:  98 QRS Duration:  199 QT Interval:  446 QTC Calculation: 564 R Axis:   261  Text Interpretation: Sinus or ectopic atrial rhythm Short PR interval Consider right atrial enlargement RBBB and LAFB Artifact in lead(s) I III aVL V4  and baseline wander in lead(s) V5 Confirmed by Towana Sharper [phone]) on 05/19/2024 2:39:38 PM          Medications: I ordered medication including Reglan , LR.  I have reviewed the patients home medicines and have made adjustments as needed.  Critical Interventions: None  Social Determinants of Health: Living at SNF facility  Disposition: 3:24 PM Care of Chase Combs transferred to [facility] and Dr. Lenor at the end of my shift as the patient will require reassessment once labs/imaging have resulted. Patient presentation, ED course, and plan of care discussed with review of all pertinent labs and imaging. Please see his/her note for further details regarding further ED course and disposition. Plan at time of handoff is await labs and imaging, dissipate likely admission with likely GI consult. This may be altered or completely changed at the discretion of the oncoming team pending results of further workup.    Final diagnoses:  Abdominal distention  Nausea vomiting and diarrhea  Chest pain, unspecified type  Shortness of breath    ED Discharge Orders     None          Beola Terrall GORMAN DEVONNA 05/19/24 1524    Towana Sharper BROCKS, MD 05/19/24 [phone]  "

## 2024-05-19 NOTE — H&P (Signed)
 " History and Physical    Chase Combs FMW:[id number] DOB: [birth year] DOA: 05/19/2024  PCP: Wonda Worth SQUIBB, PA   Patient coming from: SNF   Chief Complaint: Abdominal pain, N/V/D   HPI: Chase Combs is a 78 y.o. male with medical history significant for hypertension, type 2 diabetes mellitus, CAD, chronic HFrEF, atrial fibrillation on Eliquis , OSA with CPAP intolerance, and chronic ileus/Ogilvie syndrome who presents with recurrent abdominal pain, distention, nausea, and vomiting.  Patient reports that he was in his usual state until midday yesterday when he developed abdominal discomfort and nausea.  He went on to have progressive symptoms with pain across the abdomen, worsening distention, and recurrent bouts of vomiting that went on all through the night.  He continued to have nausea, vomiting, and inability to tolerate anything by mouth today, prompting his presentation to the ED.  ED Course: Upon arrival to the ED, patient is found to be afebrile and saturating mid 90s on room air with mild tachycardia and stable blood pressure.  Labs are most notable for sodium 128, BUN 67, creatinine 1.96, normal WBC, troponin 49, and proBNP 10,122.  CT is concerning for distal large bowel obstruction with transition point at the proximal sigmoid colon.  GI was consulted by the ED PA patient was given 500 mL LR and Reglan , and orders were placed for NG tube and rectal tube.  Review of Systems:  All other systems reviewed and apart from HPI, are negative.  Past Medical History:  Diagnosis Date   Abdominal hernia    per pt   Arthritis    hands   Benign localized prostatic hyperplasia with lower urinary tract symptoms (LUTS)    CAD (coronary artery disease) 05/2004   cardiologist--- dr anner;  positive stress test , had outpt cardiac cath 05-27-2004 stenosis RI;  06-18-2004 cath w/ PCI and BMS x1 to pRI;   STEMI 02-16-2009  s/p cath w/ PCI thrombectomy for total occluded RCA , BMS x1 to pRCA;   nuclear stress test 03-15-2012 low risk no ischemia but sig inferior-inferolateral infarct , ef 51%   Dyslipidemia    Essential hypertension    Heart murmur    History of COVID-19 05/2020   per pt mild symptoms that resolved   History of ST elevation myocardial infarction (STEMI) 02/16/2009   inferior STEMI   cath w/ pci w/ BMS to pRCA   History of urinary retention    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[facility]) 04/2021   urologist--- dr borden/ radiation oncology-- dr patrcia;  dx 12/ 2022,  Gleason 4+5, PSA 24.6, volume 94.6cc;  plan to start IMRT   OSA (obstructive sleep apnea)    per pt dx approx 2008, no cpap intolerant   PONV (postoperative nausea and vomiting)    Pre-diabetes    S/p bare metal coronary artery stent    01/ 2006  x1 BMS to pRI (CoStar study stent);  and 09/ 2010  x1 BMS to pRCA   Status post total knee replacement, 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    Past Surgical History:  Procedure Laterality Date   BOWEL DECOMPRESSION N/A 02/18/2024   Procedure: DECOMPRESSION, INTESTINE;  Surgeon: Albertus Gordy HERO, MD;  Location: [facility] ENDOSCOPY;  Service: Gastroenterology;  Laterality: N/A;   CARDIAC CATHETERIZATION  05/27/2004   outpatient by dr burnard [facility] cardiology) for positive stress test;  80% stensis pRI   CARDIOVERSION N/A 02/21/2024   Procedure: CARDIOVERSION;  Surgeon: Cherrie Toribio SAUNDERS, MD;  Location: [facility] INVASIVE CV LAB;  Service:  Cardiovascular;  Laterality: N/A;   COLONOSCOPY N/A 02/18/2024   Procedure: COLONOSCOPY;  Surgeon: Albertus Gordy HERO, MD;  Location: [facility] ENDOSCOPY;  Service: Gastroenterology;  Laterality: N/A;   CORONARY ANGIOPLASTY WITH STENT PLACEMENT  06/18/2004   @[facility]  by Dr Lavon;  PCI w/ BMS to pRI  (CoStar study stent - 2.5 x 16)   CORONARY ANGIOPLASTY WITH STENT PLACEMENT  02/16/2009   @[facility]  by dr verlin;   Inferior STEMI:  PCI w/ thrombectomy total occluded RCA, BMS x1 to pRCA (Vision BMS 3.5 x 23 -> 4.0 mm), residual 40-50% pLAD, ef 45-50%   CORONARY PRESSURE/FFR STUDY  N/A 01/17/2024   Procedure: CORONARY PRESSURE/FFR STUDY;  Surgeon: Mady Bruckner, MD;  Location: [facility] INVASIVE CV LAB;  Service: Cardiovascular;  Laterality: N/A;   CORONARY STENT INTERVENTION N/A 01/17/2024   Procedure: CORONARY STENT INTERVENTION;  Surgeon: Mady Bruckner, MD;  Location: [facility] INVASIVE CV LAB;  Service: Cardiovascular;  Laterality: N/A;   CORONARY ULTRASOUND/IVUS N/A 01/17/2024   Procedure: Coronary Ultrasound/IVUS;  Surgeon: Mady Bruckner, MD;  Location: [facility] INVASIVE CV LAB;  Service: Cardiovascular;  Laterality: N/A;   CORONARY/GRAFT ACUTE MI REVASCULARIZATION N/A 02/01/2024   Procedure: Coronary/Graft Acute MI Revascularization;  Surgeon: Elmira Newman PARAS, MD;  Location: [facility] INVASIVE CV LAB;  Service: Cardiovascular;  Laterality: N/A;   ESOPHAGOGASTRODUODENOSCOPY N/A 02/02/2024   Procedure: EGD (ESOPHAGOGASTRODUODENOSCOPY);  Surgeon: Nandigam, Kavitha V, MD;  Location: [facility] ENDOSCOPY;  Service: Gastroenterology;  Laterality: N/A;   FLEXIBLE SIGMOIDOSCOPY N/A 10/20/2023   Procedure: KINGSTON SIDE;  Surgeon: Legrand Victory LITTIE DOUGLAS, MD;  Location: [facility] ENDOSCOPY;  Service: Gastroenterology;  Laterality: N/A;   FLEXIBLE SIGMOIDOSCOPY N/A 02/02/2024   Procedure: SIGMOIDOSCOPY, FLEXIBLE;  Surgeon: Shila Gustav GAILS, MD;  Location: [facility] ENDOSCOPY;  Service: Gastroenterology;  Laterality: N/A;   GOLD SEED IMPLANT N/A 09/19/2021   Procedure: GOLD SEED IMPLANT;  Surgeon: Cam Morene ORN, MD;  Location[facility];  Service: Urology;  Laterality: N/A;  ONLY NEEDS 30 MIN FOR ALL   INGUINAL HERNIA REPAIR Right 09/02/1999   @[facility] ;   recurrent repair right inguinal hernia;   previous repair approx 1990s   INGUINAL HERNIA REPAIR Left    1990s   KNEE ARTHROSCOPY W/ MENISCAL REPAIR Right 12/18/2005   @[facility]    LUMBAR DISC SURGERY  1982   L5--S1   RIGHT/LEFT HEART CATH AND CORONARY ANGIOGRAPHY N/A 01/17/2024   Procedure: RIGHT/LEFT HEART CATH AND CORONARY ANGIOGRAPHY;   Surgeon: Mady Bruckner, MD;  Location: [facility] INVASIVE CV LAB;  Service: Cardiovascular;  Laterality: N/A;   SPACE OAR INSTILLATION N/A 09/19/2021   Procedure: SPACE OAR INSTILLATION;  Surgeon: Cam Morene ORN, MD;  Location: [facility];  Service: Urology;  Laterality: N/A;   TOTAL KNEE ARTHROPLASTY Left 10/01/2023   Procedure: ARTHROPLASTY, KNEE, TOTAL;  Surgeon: Kay Kemps, MD;  Location: [facility] ORS;  Service: Orthopedics;  Laterality: Left;   TRANSESOPHAGEAL ECHOCARDIOGRAM (CATH LAB) N/A 02/21/2024   Procedure: TRANSESOPHAGEAL ECHOCARDIOGRAM;  Surgeon: Cherrie Toribio SAUNDERS, MD;  Location: [facility] INVASIVE CV LAB;  Service: Cardiovascular;  Laterality: N/A;    Social History:   reports that he has never smoked. He has never used smokeless tobacco. He reports that he does not drink alcohol and does not use drugs.  Allergies[1]  Family History  Problem Relation Age of Onset   Lung cancer Mother    Colon cancer Neg Hx    Esophageal cancer Neg Hx      Prior to Admission medications  Medication Sig Start Date  End Date Taking? Authorizing Provider  amiodarone  (PACERONE ) 200 MG tablet Take 1 tablet (200 mg total) by mouth daily. 04/07/24   Milford, Harlene HERO, FNP  apixaban  (ELIQUIS ) 5 MG TABS tablet Take 1 tablet (5 mg total) by mouth 2 (two) times daily. 03/23/24   Colletta Manuelita Garre, PA-C  budesonide  (PULMICORT ) 0.25 MG/2ML nebulizer solution Take 2 mLs (0.25 mg total) by nebulization 2 (two) times daily. 05/11/24   Sebastian Toribio GAILS, MD  CALCIUM  PO Take 500 mg by mouth daily.    [provider]  clopidogrel  (PLAVIX ) 75 MG tablet Take 1 tablet (75 mg total) by mouth daily. 03/23/24   Colletta Manuelita Garre, PA-C  cyanocobalamin  (VITAMIN B12) 1000 MCG tablet Take 1,000 mcg by mouth daily.    [provider]  [Paused] digoxin  (LANOXIN ) 0.125 MG tablet Take 1/2 tablet (0.0625 mg total) by mouth daily. Wait to take this until your doctor or other care provider  tells you to start again. 03/23/24   Colletta Manuelita Garre, PA-C  empagliflozin  (JARDIANCE ) 10 MG TABS tablet Take 1 tablet (10 mg total) by mouth daily before breakfast. 03/10/24   Lee, Jordan, NP  fiber supplement, BANATROL TF, liquid Take 60 mLs by mouth 2 (two) times daily. 05/11/24   Sebastian Toribio GAILS, MD  furosemide  (LASIX ) 40 MG tablet Take 1 tablet (40 mg total) by mouth daily. 05/11/24 08/09/24  Sebastian Toribio GAILS, MD  hydrOXYzine (ATARAX) 25 MG tablet Take 12.5-25 mg by mouth at bedtime as needed for anxiety. 03/27/24   [provider]  ipratropium (ATROVENT ) 0.02 % nebulizer solution Take 2.5 mLs (0.5 mg total) by nebulization 2 (two) times daily. 05/11/24   Sebastian Toribio GAILS, MD  Multiple Vitamins-Minerals (MULTIVITAMIN MEN 50+) TABS Take 1 tablet by mouth daily.    [provider]  nitroGLYCERIN  (NITROSTAT ) 0.4 MG SL tablet Place 0.4 mg under the tongue every 5 (five) minutes as needed for chest pain. 01/19/24   [provider]  Nystatin  (GERHARDT'S BUTT CREAM) CREA Apply 1 Application topically 2 (two) times daily. 05/11/24   Sebastian Toribio GAILS, MD  ondansetron  (ZOFRAN ) 8 MG tablet Take 8 mg by mouth every 8 (eight) hours as needed for nausea or vomiting. 02/29/24   [provider]  pantoprazole  (PROTONIX ) 40 MG tablet Take 1 tablet (40 mg total) by mouth daily. 02/24/24   Arrien, Mauricio Daniel, MD  polyethylene glycol (MIRALAX  / GLYCOLAX ) 17 g packet Take 17 g by mouth daily. 05/12/24   Sebastian Toribio GAILS, MD  potassium chloride  SA (KLOR-CON  M) 20 MEQ tablet Take 1 tablet (20 mEq total) by mouth 2 (two) times daily. 04/07/24   Glena Harlene HERO, FNP  Probiotic Product (PROBIOTIC PO) Take 1,000 mg by mouth daily. Chew    [provider]  revefenacin  (YUPELRI ) 175 MCG/3ML nebulizer solution Take 3 mLs (175 mcg total) by nebulization daily. 05/12/24   Sebastian Toribio GAILS, MD  rosuvastatin  (CRESTOR ) 20 MG tablet TAKE 1 TABLET(20 MG) BY MOUTH DAILY  11/29/23   Anner Alm ORN, MD  [Paused] sacubitril -valsartan  (ENTRESTO ) 24-26 MG Take 1 tablet by mouth 2 (two) times daily. Wait to take this until your doctor or other care provider tells you to start again. 04/07/24   Glena Harlene HERO, FNP  sertraline (ZOLOFT) 50 MG tablet Take 50 mg by mouth daily.    [provider]  spironolactone  (ALDACTONE ) 25 MG tablet Take 1 tablet (25 mg total) by mouth daily. 05/11/24 08/09/24  Sebastian Toribio GAILS, MD  tamsulosin  (FLOMAX ) 0.4 MG CAPS capsule Take 1 capsule (0.4 mg total) by mouth daily after supper. 11/17/21   Patrcia Cough, MD    Physical Exam: Vitals:   05/19/24 1420 05/19/24 1645 05/19/24 1713 05/19/24 1830  BP: 98/66 (!) 108/58  103/63  Pulse: (!) 103 96 96 97  Resp: 20 19 (!) 24 17  Temp:   (!) 97.4 F (36.3 C)   TempSrc:   Oral   SpO2: 96% 93% 97% 94%    Constitutional: NAD, no pallor or diaphoresis   Eyes: PERTLA, lids and conjunctivae normal ENMT: Mucous membranes are moist. Posterior pharynx clear of any exudate or lesions.   Neck: supple, no masses  Respiratory: no wheezing, no crackles. No accessory muscle use.  Cardiovascular: S1 & S2 heard, regular rate and rhythm. Trace lower extremity edema.  Abdomen: Soft, distended, generally tender. Bowel sounds active.  Musculoskeletal: no clubbing / cyanosis. No joint deformity upper and lower extremities.   Skin: no significant rashes, lesions, ulcers. Warm, dry, well-perfused. Neurologic: CN 2-12 grossly intact. Moving all extremities. Alert and oriented.  Psychiatric: Calm. Cooperative.    Labs and Imaging on Admission: I have personally reviewed following labs and imaging studies  CBC: Recent Labs  Lab 05/19/24 1326 05/19/24 1550  WBC 5.6  --   NEUTROABS 4.0  --   HGB 13.8 14.3  HCT 41.2 42.0  MCV 91.4  --   PLT 480*  --    Basic Metabolic Panel: Recent Labs  Lab 05/19/24 1326 05/19/24 1550 05/19/24 1632  NA  --  130* 128*  K  --  3.2* 3.4*  CL  --   92* 91*  CO2  --   --  22  GLUCOSE  --  75 70  BUN  --  70* 67*  CREATININE  --  2.40* 1.96*  CALCIUM   --   --  8.2*  MG 1.8  --   --    GFR: Estimated Creatinine Clearance: 29.9 mL/min (A) (by C-G formula based on SCr of 1.96 mg/dL (H)). Liver Function Tests: Recent Labs  Lab 05/19/24 1632  AST 31  ALT 57*  ALKPHOS 106  BILITOT 0.4  PROT 5.6*  ALBUMIN 2.9*   Recent Labs  Lab 05/19/24 1632  LIPASE <10*   No results for input(s): AMMONIA in the last 168 hours. Coagulation Profile: Recent Labs  Lab 05/19/24 1418  INR 2.0*   Cardiac Enzymes: No results for input(s): CKTOTAL, CKMB, CKMBINDEX, TROPONINI in the last 168 hours. BNP (last 3 results) Recent Labs    01/07/24 2301 05/19/24 1326  PROBNP 15,245.0* 10,122.0*   HbA1C: No results for input(s): HGBA1C in the last 72 hours. CBG: No results for input(s): GLUCAP in the last 168 hours. Lipid Profile: No results for input(s): CHOL, HDL, LDLCALC, TRIG, CHOLHDL, LDLDIRECT in the last 72 hours. Thyroid  Function Tests: No results for input(s): TSH, T4TOTAL, FREET4, T3FREE, THYROIDAB in the last 72 hours. Anemia Panel: No results for input(s): VITAMINB12, FOLATE, FERRITIN, TIBC, IRON, RETICCTPCT in the last 72 hours. Urine analysis:    Component Value Date/Time   COLORURINE YELLOW 04/22/2024 1205   APPEARANCEUR CLEAR 04/22/2024 1205   LABSPEC >1.030 (H) 04/22/2024 1205   PHURINE 5.5 04/22/2024 1205   GLUCOSEU NEGATIVE 04/22/2024 1205   HGBUR NEGATIVE 04/22/2024 1205   BILIRUBINUR NEGATIVE 04/22/2024 1205   KETONESUR NEGATIVE 04/22/2024 1205   PROTEINUR NEGATIVE 04/22/2024 1205   UROBILINOGEN 0.2 02/17/2009 0645   NITRITE NEGATIVE 04/22/2024 1205  LEUKOCYTESUR NEGATIVE 04/22/2024 1205   Sepsis Labs: @LABRCNTIP (procalcitonin:4,lacticidven:4) ) Recent Results (from the past 240 hours)  Resp panel by RT-PCR (RSV, Flu A&B, Covid) Anterior Nasal Swab     Status:  None   Collection Time: 05/19/24  2:18 PM   Specimen: Anterior Nasal Swab  Result Value Ref Range Status   SARS Coronavirus 2 by RT PCR NEGATIVE NEGATIVE Final   Influenza A by PCR NEGATIVE NEGATIVE Final   Influenza B by PCR NEGATIVE NEGATIVE Final    Comment: (NOTE) The Xpert Xpress SARS-CoV-2/FLU/RSV plus assay is intended as an aid in the diagnosis of influenza from Nasopharyngeal swab specimens and should not be used as a sole basis for treatment. Nasal washings and aspirates are unacceptable for Xpert Xpress SARS-CoV-2/FLU/RSV testing.  Fact Sheet for Patients: bloggercourse.com  Fact Sheet for Healthcare Providers: seriousbroker.it  This test is not yet approved or cleared by the [place]  FDA and has been authorized for detection and/or diagnosis of SARS-CoV-2 by FDA under an Emergency Use Authorization (EUA). This EUA will remain in effect (meaning this test can be used) for the duration of the COVID-19 declaration under Section 564(b)(1) of the Act, 21 U.S.C. section 360bbb-3(b)(1), unless the authorization is terminated or revoked.     Resp Syncytial Virus by PCR NEGATIVE NEGATIVE Final    Comment: (NOTE) Fact Sheet for Patients: bloggercourse.com  Fact Sheet for Healthcare Providers: seriousbroker.it  This test is not yet approved or cleared by the [place]  FDA and has been authorized for detection and/or diagnosis of SARS-CoV-2 by FDA under an Emergency Use Authorization (EUA). This EUA will remain in effect (meaning this test can be used) for the duration of the COVID-19 declaration under Section 564(b)(1) of the Act, 21 U.S.C. section 360bbb-3(b)(1), unless the authorization is terminated or revoked.  Performed at [facility], [address] [place], [place] [address]      Radiological Exams on Admission: CT CHEST ABDOMEN PELVIS WO  CONTRAST Result Date: 05/19/2024 EXAM: CT CHEST, ABDOMEN AND PELVIS WITHOUT CONTRAST 05/19/2024 05:04:14 PM TECHNIQUE: CT of the chest, abdomen and pelvis was performed without the administration of intravenous contrast. Multiplanar reformatted images are provided for review. Automated exposure control, iterative reconstruction, and/or weight based adjustment of the mA/kV was utilized to reduce the radiation dose to as low as reasonably achievable. COMPARISON: CT abdomen and pelvis 04/21/2024. CLINICAL HISTORY: Abdominal distention, Hx of nonobstructive ileus with repeated symptoms of n/v/d. FINDINGS: CHEST: MEDIASTINUM AND LYMPH NODES: Coronary and aortic atherosclerotic calcifications are noted. The heart is moderately enlarged. Pericardium is unremarkable. There are multiple bilateral thyroid  nodules measuring up to 15 mm. The central airways are clear. No mediastinal, hilar or axillary lymphadenopathy. LUNGS AND PLEURA: There are bands of atelectasis in the right lower lobe, right middle lobe and inferior right upper lobe. No pulmonary edema. No pleural effusion or pneumothorax. ABDOMEN AND PELVIS: LIVER: The liver is unremarkable. GALLBLADDER AND BILE DUCTS: Gallbladder is unremarkable. No biliary ductal dilatation. SPLEEN: No acute abnormality. PANCREAS: No acute abnormality. ADRENAL GLANDS: Right adrenal nodule, likely adenoma, appears unchanged. Left adrenal gland within normal limits. KIDNEYS, URETERS AND BLADDER: Multiple bilateral renal cysts are again seen measuring up to 3.8 cm on the right. There are additional mildly hyperdense areas in the kidneys for example left kidney image 3/81 and right kidney image 3/64 measuring 2.8 cm, which are similar to the prior study. There is a single punctate calculus in the right kidney. No stones in the ureters. There  is no hydronephrosis. No perinephric or periureteral stranding. Nodular protrusion of the prostate gland into the bladder base which is unchanged.  Prostate radiotherapy seeds are present. GI AND BOWEL: There is moderate air fluid level in the stomach. Ascending colon measures 10 cm. Descending colon measures 6 cm. Air and fluid levels are seen throughout the colon. Transition is seen at the level of the proximal sigmoid colon. There is sigmoid colon diverticulosis. No focal inflammation or wall thickening identified in the colon. The appendix appears shortened and within normal limits. Additionally there is dilatation of ileal loops with air fluid levels to the level of the terminal ileum measuring up to 4.5 cm. Jejunal loops appear normal in size proximally. REPRODUCTIVE ORGANS: The prostate gland is enlarged. Prostate radiotherapy seeds are present. Nodular protrusion of the prostate gland into the bladder base which is unchanged. PERITONEUM AND RETROPERITONEUM: No ascites. No free air. No pneumatosis. There is a small fat containing umbilical hernia which is unchanged. VASCULATURE: Aorta is normal in caliber. There are atherosclerotic calcifications of the aorta. ABDOMINAL AND PELVIS LYMPH NODES: No lymphadenopathy. BONES AND SOFT TISSUES: Post degenerative changes of the right shoulder. There is bilateral gynecomastia. There is mild compression deformity of T11 which is unchanged. No acute osseous abnormality. No focal soft tissue abnormality. IMPRESSION: 1. Distal large bowel obstruction with transition at the proximal sigmoid colon and associated small bowel dilatation. 2. Sigmoid colon diverticulosis without diverticulitis. 3. Multiple thyroid  nodules measuring up to 1.5 cm, for which non-emergent thyroid  ultrasound is recommended. 4. Indeterminate bilateral hyperdense renal lesions measuring up to 2.8 cm, for which renal protocol MRI (preferred) or CT without and with contrast is recommended. 5. Punctate right renal calculus. Electronically signed by: Greig Pique MD 05/19/2024 06:34 PM EST RP Workstation: [id number]   DG Chest 2 View Result Date:  05/19/2024 CLINICAL DATA:  Shortness of breath, chest tightness EXAM: CHEST - 2 VIEW COMPARISON:  April 30, 2024 FINDINGS: Stable cardiomediastinal silhouette. Hypoinflation of the lungs. Stable right perihilar subsegmental atelectasis or scarring. Left lung is clear. IMPRESSION: Hypoinflation of the lungs with right perihilar subsegmental atelectasis or scarring. Electronically Signed   By: Lynwood Landy Raddle M.D.   On: 05/19/2024 14:27    EKG: Independently reviewed. Sinus or ectopic atrial rhythm.   Assessment/Plan   1. Ogilvie syndrome, recurrent pseudo-obstruction  - Presents with recurrent abdominal distension, pain, and N/V and has CT findings concerning for large bowel obstruction with transition at proximal sigmoid colon    - He continues to pass flatus and some liquid stool  - Appreciate GI recommendations, plan for NGT and rectal tube, monitor/correct electrolytes, supportive care   2. AKI  - SCr is 1.96 on admission, up from 0.74 on 05/11/24  - No hydronephrosis on CT in ED; this is likely prerenal in setting of N/V  - Hold diuretics, Jardiance , and Entresto , renally-dose medications, continue gentle IVF hydration, and repeat chem panel in am   3. Chronic HFrEF  - EF was 20-25% on echo from September 2025  - Appears compensated  - Monitor daily weight and I/Os  4. Hyponatremia  - Serum sodium 128 in setting of hypovolemia    - Hold diuretics, continue gentle IVF hydration, repeat chem panel in am    5. Atrial fibrillation  - Use IV heparin  for now until able to tolerate oral medications    6. CAD - No anginal symptoms  - Resume Plavix  when able to tolerate oral medications    7. PUD  -  PPI    8. Thyroid  nodules; renal lesions  - Indeterminate thyroid  nodules and b/l renal lesions noted incidentally on CT in ED  - Outpatient follow-up with thyroid  US  and renal protocol MRI recommended    DVT prophylaxis: IV heparin   Code Status: DNR/DNI  Level of Care: Level of  care: Telemetry Family Communication: None present   Disposition Plan:  Patient is from: SNF  Anticipated d/c is to: SNF  Anticipated d/c date is: 05/22/24  Patient currently: Pending return of bowel function, improved renal function  Consults called: GI  Admission status: Inpatient     Evalene GORMAN Sprinkles, MD [facility]  05/19/2024, 8:46 PM       [1]  Allergies Allergen Reactions   Codeine Nausea And Vomiting   Latex Rash    Oral blisters when used at dentist   "

## 2024-05-19 NOTE — ED Notes (Signed)
 Unsuccessful PIV attempt. Was able to draw blood

## 2024-05-19 NOTE — ED Triage Notes (Signed)
 Pt BIBA from [facility] acute episode of N/V/Diarrhea/Abd distention. Pt unable to keep PO intake since last night. Per EMS pt has chronic hx of abd distention and N/V/Diarrhea but was resolving. Denies any fever. Pt reports sharp abd pain across the abdomen. Denies any dysuria/urinary retention. Pt AOx4

## 2024-05-20 ENCOUNTER — Inpatient Hospital Stay (HOSPITAL_COMMUNITY)

## 2024-05-20 DIAGNOSIS — R197 Diarrhea, unspecified: Secondary | ICD-10-CM | POA: Diagnosis not present

## 2024-05-20 DIAGNOSIS — K5981 Ogilvie syndrome: Secondary | ICD-10-CM

## 2024-05-20 DIAGNOSIS — R112 Nausea with vomiting, unspecified: Secondary | ICD-10-CM

## 2024-05-20 DIAGNOSIS — R109 Unspecified abdominal pain: Secondary | ICD-10-CM

## 2024-05-20 LAB — C DIFFICILE QUICK SCREEN W PCR REFLEX
C Diff antigen: NEGATIVE
C Diff interpretation: NOT DETECTED
C Diff toxin: NEGATIVE

## 2024-05-20 LAB — URINALYSIS, ROUTINE W REFLEX MICROSCOPIC
Bilirubin Urine: NEGATIVE
Glucose, UA: NEGATIVE mg/dL
Hgb urine dipstick: NEGATIVE
Ketones, ur: NEGATIVE mg/dL
Leukocytes,Ua: NEGATIVE
Nitrite: NEGATIVE
Protein, ur: NEGATIVE mg/dL
Specific Gravity, Urine: 1.017 (ref 1.005–1.030)
pH: 5 (ref 5.0–8.0)

## 2024-05-20 LAB — CBC
HCT: 36.2 % — ABNORMAL LOW (ref 39.0–52.0)
Hemoglobin: 12.3 g/dL — ABNORMAL LOW (ref 13.0–17.0)
MCH: 30.4 pg (ref 26.0–34.0)
MCHC: 34 g/dL (ref 30.0–36.0)
MCV: 89.4 fL (ref 80.0–100.0)
Platelets: 377 K/uL (ref 150–400)
RBC: 4.05 MIL/uL — ABNORMAL LOW (ref 4.22–5.81)
RDW: 17.4 % — ABNORMAL HIGH (ref 11.5–15.5)
WBC: 4.3 K/uL (ref 4.0–10.5)
nRBC: 0 % (ref 0.0–0.2)

## 2024-05-20 LAB — BASIC METABOLIC PANEL WITH GFR
Anion gap: 15 (ref 5–15)
BUN: 74 mg/dL — ABNORMAL HIGH (ref 8–23)
CO2: 21 mmol/L — ABNORMAL LOW (ref 22–32)
Calcium: 7.9 mg/dL — ABNORMAL LOW (ref 8.9–10.3)
Chloride: 93 mmol/L — ABNORMAL LOW (ref 98–111)
Creatinine, Ser: 1.82 mg/dL — ABNORMAL HIGH (ref 0.61–1.24)
GFR, Estimated: 38 mL/min — ABNORMAL LOW
Glucose, Bld: 79 mg/dL (ref 70–99)
Potassium: 3 mmol/L — ABNORMAL LOW (ref 3.5–5.1)
Sodium: 129 mmol/L — ABNORMAL LOW (ref 135–145)

## 2024-05-20 LAB — APTT
aPTT: 51 s — ABNORMAL HIGH (ref 24–36)
aPTT: 52 s — ABNORMAL HIGH (ref 24–36)

## 2024-05-20 LAB — MAGNESIUM: Magnesium: 2.1 mg/dL (ref 1.7–2.4)

## 2024-05-20 LAB — HEPARIN LEVEL (UNFRACTIONATED): Heparin Unfractionated: >1.1 [IU]/mL — ABNORMAL HIGH (ref 0.30–0.70)

## 2024-05-20 MED ORDER — POTASSIUM CHLORIDE 10 MEQ/100ML IV SOLN
10.0000 meq | INTRAVENOUS | Status: AC
  Administered 2024-05-20 (×6): 10 meq via INTRAVENOUS
  Filled 2024-05-20 (×6): qty 100

## 2024-05-20 MED ORDER — POTASSIUM CHLORIDE IN NACL 20-0.9 MEQ/L-% IV SOLN
INTRAVENOUS | Status: DC
  Filled 2024-05-20 (×2): qty 1000

## 2024-05-20 NOTE — Consult Note (Signed)
 "                                               Consultation Note   Referring Provider:   [facility] PCP: Wonda Worth SQUIBB, PA Primary Gastroenterologist: Glendia Holt, MD  Reason for Consultation:  DOA: 05/19/2024         Hospital Day: 2   ASSESSMENT    78 year old male with multiple medical problems admitted with  hypotension, nausea, abdominal pain and distention  Recurrent ileus  / pseudo-obstruction with transition point at sigmoid colon on CT scan.  Colonic obstruction not suspected, patient has had sigmoidoscopies x 2 this year. This has been a chronic intermittent problem exacerbated by illness / medications and possible decreased gut perfusion.   History esophageal ulcer, grade C esophagitis on EGD on Sept 2025  AKI on CKD2 Hypokalemia Metabolic acidosis  Extensive cardiac history including CAD / NSTEMI  Chronic heart failure  DM 2  History of prostate cancer  See PMH for any additional medical history  / medical problems  Principal Problem:   Pseudo-obstruction of colon Active Problems:   Coronary artery disease involving native coronary artery of native heart without angina pectoris   Hypokalemia   Ogilvie syndrome   PUD (peptic ulcer disease)   Acute kidney injury   Paroxysmal atrial flutter (HCC)   Chronic combined systolic and diastolic congestive heart failure (HCC)   Thyroid  nodule   Renal lesion    PLAN:   --Electrolyte replacement in progress --Hesitant to give neostigmine  in light of significant cardiac history.  --Will likely give trial of Mestinon and or another trial of Relistor  though this will surely exacerbate diarrhea likely causing more electrolytes abnormalities / gut dysmotility.   -- Thankfully he is not requiring any narcotics -- Continue NGT decompression for now  HPI   Brief History:  Chase Combs is a 78 year old male with multiple medical problems.  He has been hospitalized multiple times this year with several  issues not limited to respiratory failure, heart failure, CAD / ACS , A-fib with RVR, orthopedic surgery , etc. Hospital courses have been prolonged and repeatedly complicated by recurrent ileus / pseudo- obstructions , and diarrhea for which we have seen him numerous times inpatient.  While hospitalized in August 2025 following a STEMI patient developed sepsis , found to have intestinal pneumatosis with concern for ischemic colitis.  Developed gastrointestinal bleeding, underwent EGD on 02/02/2024 with findings of superficial esophageal ulcer, grade C esophagitis and a few nonbleeding erosions in the duodenum.  Sigmoidoscopy with findings of red blood in the rectum , sigmoid colon and descending colon to the transverse colon but no visible source and a nonbleeding superficial in the rectum  (possibly from  Flexi-Seal ).  Ileus /Ogilvie's persisted.  He did not respond to Relistor .  Initially had some response to neostigmine  but eventually lost efficacy after several doses.  On 02/18/2024 we did a colonoscopy for decompression and a decompression tube was placed.  He had resolution of the ileus.   Most recently we saw patient in the hospital 04/26/2024 for progressive ileus /Ogilvie's.  He had been admitted a few days prior with hypotension, nausea, vomiting, diarrhea . refer to that note for details.  See our consult note for details.  In summary, we had concerns about retrial of neostigmine  with his extensive cardiac history and variable  heart rate,  We tried to decompress his colon with enemas, rectal tube placement for which he did have some response.  He was also given MiraLAX  3 times daily.  In the earlier he seemed to have lost response to Relistor  it appears from her notes that we did retry Relistor ?  We signed off on 04/30/2024.  Patient was discharged 05/11/2024.   Interval history Patient was readmitted yesterday with recurrent abdominal pain, nausea and vomiting.  Noncontrast CAT scan shows distal large  bowel obstruction with transition at the proximal sigmoid colon and associated small bowel dilation.  Patient tells me he was doing well until about 2 or 3 days ago.  He has been nauseated but vomiting mainly just phlegm.  He has been having diarrhea but that is chronic for him.  Says he is incontinent of stool and diaper.  He does not know of any blood being in his bowel movement but thinks he would have been told by someone if there was ( At [facility])  Recent Labs    05/19/24 1632  PROT 5.6*  ALBUMIN 2.9*  AST 31  ALT 57*  ALKPHOS 106  BILITOT 0.4   Recent Labs    05/19/24 1326 05/19/24 1550 05/20/24 0739  WBC 5.6  --  4.3  HGB 13.8 14.3 12.3*  HCT 41.2 42.0 36.2*  MCV 91.4  --  89.4  PLT 480*  --  377   Recent Labs    05/19/24 1550 05/19/24 1632 05/20/24 0739  NA 130* 128* 129*  K 3.2* 3.4* 3.0*  CL 92* 91* 93*  CO2  --  22 21*  GLUCOSE 75 70 79  BUN 70* 67* 74*  CREATININE 2.40* 1.96* 1.82*  CALCIUM   --  8.2* 7.9*     DG Abd 1 View CLINICAL DATA:  Nasogastric tube placement  EXAM: ABDOMEN - 1 VIEW  COMPARISON:  CT yesterday  FINDINGS: Tip and side port of the enteric tube below the diaphragm in the stomach. Gaseous colonic distention is again seen.  IMPRESSION: Tip and side port of the enteric tube below the diaphragm in the stomach.  Electronically Signed   By: Andrea Gasman M.D.   On: 05/20/2024 12:19       Prior Endoscopic Evaluations:    02/18/2024 colonoscopy (proximal transverse colon) for treatment of focal disease - Segmental moderate inflammation was found in the proximal transverse colon secondary to ischemic colitis. - Dilated in the descending colon and in the transverse colon. - Mild diverticulosis in the sigmoid colon and in the descending colon. There was mild narrowing of the distal sigmoid colon. - Decompression tube placement was successfully performed. - No specimens collected.  02/02/2024 sigmoidoscopy ( to left transverse)   - Blood in the rectum, in the sigmoid colon, in the descending colon and in the transverse colon. No obvious source of bleed - Diverticulosis in the sigmoid colon and in the descending colon. No active bleeding - A single superficial ulcer in the rectum. No active bleeding   02/02/2024 EGD GI bleed --Esophageal ulcer with no bleeding and no stigmata of recent bleeding -LA grade C reflux esophagitis -Normal stomach --Duodenal erosions without bleeding  09/30/2023 sigmoidoscopy (left transverse) to evaluate diarrhea and abnormal CT - Preparation of the colon was fair. - Diverticulosis in the left colon. - Congested mucosa in the entire examined colon. Biopsied. - Internal hemorrhoids. - The examination was otherwise normal.  Review of Systems: All systems reviewed and negative except where noted in HPI.  Physical Exam: Vital signs in last 24 hours: Temp:  [97.4 F (36.3 C)-98.9 F (37.2 C)] 97.4 F (36.3 C) (12/27 1042) Pulse Rate:  [88-103] 88 (12/27 1042) Resp:  [17-24] 17 (12/27 1042) BP: (94-124)/(58-73) 106/62 (12/27 1042) SpO2:  [93 %-97 %] 96 % (12/27 1042) Weight:  [75.6 kg] 75.6 kg (12/27 0444)   General:  Pleasant male in NAD Psych:  Cooperative. Normal mood and affect Eyes: Pupils equal Ears:  Normal auditory acuity Nose: No deformity, discharge or lesions Neck:  Supple, no masses felt Lungs:  Clear to auscultation.  Heart:  Regular rate, regular rhythm.  Abdomen:  Soft, largely distended , tinkling bowel sounds , tympanitic , nontender, umbilical hernia  Rectal : Rectal tube with collection bag in place .  Scant amount of light brown liquid stool in collection bag .  Msk: Symmetrical without gross deformities.  Neurologic:  Alert, oriented, grossly normal neurologically Extremities : No edema Skin:  Intact without significant lesions.   OUTPATIENT MEDICATIONS Prior to Admission medications  Medication Sig Start Date End Date Taking? Authorizing Provider  Amino  Acids-Protein Hydrolys (PRO-STAT) LIQD Take 30 mLs by mouth in the morning and at bedtime. 05/15/24  Yes [provider]  amiodarone  (PACERONE ) 200 MG tablet Take 1 tablet (200 mg total) by mouth daily. 04/07/24  Yes Milford, Harlene HERO, FNP  apixaban  (ELIQUIS ) 5 MG TABS tablet Take 1 tablet (5 mg total) by mouth 2 (two) times daily. 03/23/24  Yes Colletta Manuelita Garre, PA-C  barrier cream (NON-SPECIFIED) CREA Apply 1 Application topically in the morning, at noon, and at bedtime. Apply to buttocks every shift for skin protection   Yes [provider]  budesonide  (PULMICORT ) 0.25 MG/2ML nebulizer solution Take 2 mLs (0.25 mg total) by nebulization 2 (two) times daily. 05/11/24  Yes Sebastian Toribio GAILS, MD  Calcium  Carbonate (OYSTER SHELL CALCIUM  PO) Take 500 mg by mouth every morning.   Yes [provider]  clopidogrel  (PLAVIX ) 75 MG tablet Take 1 tablet (75 mg total) by mouth daily. 03/23/24  Yes Colletta Manuelita Garre, PA-C  cyanocobalamin  (VITAMIN B12) 1000 MCG tablet Take 1,000 mcg by mouth daily.   Yes [provider]  empagliflozin  (JARDIANCE ) 10 MG TABS tablet Take 1 tablet (10 mg total) by mouth daily before breakfast. 03/10/24  Yes Lee, Jordan, NP  furosemide  (LASIX ) 20 MG tablet Take 20 mg by mouth every morning. 05/20/24  Yes [provider]  hydrOXYzine (ATARAX) 25 MG tablet Take 25 mg by mouth at bedtime as needed for anxiety. 03/27/24  Yes [provider]  ipratropium (ATROVENT ) 0.02 % nebulizer solution Take 2.5 mLs (0.5 mg total) by nebulization 2 (two) times daily. 05/11/24  Yes Sebastian Toribio GAILS, MD  Multiple Vitamins-Minerals (MULTIVITAMIN MEN 50+) TABS Take 1 tablet by mouth daily.   Yes [provider]  nitroGLYCERIN  (NITROSTAT ) 0.4 MG SL tablet Place 0.4 mg under the tongue every 5 (five) minutes as needed for chest pain. Give one tablet (0.4mg ) sublingually every 5 minutes as needed for chest pain for up to 3 doses.  01/19/24  Yes [provider]  ondansetron  (ZOFRAN ) 8 MG tablet Take 8 mg by mouth every 8 (eight) hours as needed for nausea or vomiting. 02/29/24  Yes [provider]  pantoprazole  (PROTONIX ) 40 MG tablet Take 1 tablet (40 mg total) by mouth daily. 02/24/24  Yes Arrien, Mauricio Daniel, MD  polyethylene glycol (MIRALAX  / GLYCOLAX ) 17 g packet Take 17 g by mouth daily. 05/12/24  Yes Sebastian Toribio GAILS, MD  potassium chloride  SA (KLOR-CON  M) 20 MEQ tablet Take 1 tablet (20 mEq total) by mouth 2 (two) times daily. 04/07/24  Yes Milford, Harlene HERO, FNP  revefenacin  (YUPELRI ) 175 MCG/3ML nebulizer solution Take 3 mLs (175 mcg total) by nebulization daily. 05/12/24  Yes Sebastian Toribio GAILS, MD  rosuvastatin  (CRESTOR ) 20 MG tablet TAKE 1 TABLET(20 MG) BY MOUTH DAILY Patient taking differently: Take 20 mg by mouth at bedtime. 11/29/23  Yes Anner Alm ORN, MD  saccharomyces boulardii (FLORASTOR) 250 MG capsule Take 500 mg by mouth 2 (two) times daily.   Yes [provider]  sertraline  (ZOLOFT ) 50 MG tablet Take 50 mg by mouth every morning.   Yes [provider]  spironolactone  (ALDACTONE ) 25 MG tablet Take 25 mg by mouth daily. 05/20/24  Yes [provider]  tamsulosin  (FLOMAX ) 0.4 MG CAPS capsule Take 1 capsule (0.4 mg total) by mouth daily after supper. Patient taking differently: Take 0.4 mg by mouth at bedtime. 11/17/21  Yes Patrcia Cough, MD  [Paused] digoxin  (LANOXIN ) 0.125 MG tablet Take 1/2 tablet (0.0625 mg total) by mouth daily. Patient not taking: Reported on 05/20/2024 Wait to take this until your doctor or other care provider tells you to start again. 03/23/24   Colletta Manuelita Garre, PA-C  [Paused] sacubitril -valsartan  (ENTRESTO ) 24-26 MG Take 1 tablet by mouth 2 (two) times daily. Patient not taking: Reported on 05/20/2024 Wait to take this until your doctor or other care provider tells you to start again. 04/07/24   Glena Harlene HERO, FNP     Allergies as of 05/19/2024 - Review Complete 05/19/2024  Allergen Reaction Noted   Codeine Nausea And Vomiting 03/03/2013   Latex Rash 10/01/2023    INPATIENT MEDICATIONS Current Facility-Administered Medications  Medication Dose Route Frequency Provider Last Rate Last Admin   0.9 % NaCl with KCl 20 mEq/ L  infusion   Intravenous Continuous Kc, Ramesh, MD 50 mL/hr at 05/20/24 1022 New Bag at 05/20/24 1022   budesonide  (PULMICORT ) nebulizer solution 0.25 mg  0.25 mg Nebulization BID Opyd, Timothy S, MD   0.25 mg at 05/20/24 9093   fentaNYL  (SUBLIMAZE ) injection 12.5-50 mcg  12.5-50 mcg Intravenous Q2H PRN Opyd, Timothy S, MD   50 mcg at 05/19/24 2237   heparin  ADULT infusion 100 units/mL (25000 units/250mL)  1,150 Units/hr Intravenous Continuous Kc, Ramesh, MD 11.5 mL/hr at 05/20/24 1013 1,150 Units/hr at 05/20/24 1013   ipratropium (ATROVENT ) nebulizer solution 0.5 mg  0.5 mg Nebulization BID Opyd, Timothy S, MD   0.5 mg at 05/20/24 9093   ipratropium-albuterol  (DUONEB) 0.5-2.5 (3) MG/3ML nebulizer solution 3 mL  3 mL Nebulization Q6H PRN Opyd, Timothy S, MD       pantoprazole  (PROTONIX ) injection 40 mg  40 mg Intravenous Q24H Opyd, Timothy S, MD   40 mg at 05/19/24 2127   potassium chloride  10 mEq in 100 mL IVPB  10 mEq Intravenous Q1 Hr x 6 Kc, Ramesh, MD 100 mL/hr at 05/20/24 1139 10 mEq at 05/20/24 1139   prochlorperazine  (COMPAZINE ) injection 5 mg  5 mg Intravenous Q6H PRN Opyd, Timothy S, MD   5 mg at 05/19/24 2236   sodium chloride  flush (NS) 0.9 % injection 3 mL  3 mL Intravenous Q12H Opyd, Timothy S, MD   3 mL at 05/20/24 1015     Past Medical History:  Diagnosis Date   Abdominal hernia    per pt   Arthritis    hands  Benign localized prostatic hyperplasia with lower urinary tract symptoms (LUTS)    CAD (coronary artery disease) 05/2004   cardiologist--- dr anner;  positive stress test , had outpt cardiac cath 05-27-2004 stenosis RI;  06-18-2004 cath w/ PCI and BMS x1  to pRI;   STEMI 02-16-2009  s/p cath w/ PCI thrombectomy for total occluded RCA , BMS x1 to pRCA;  nuclear stress test 03-15-2012 low risk no ischemia but sig inferior-inferolateral infarct , ef 51%   Dyslipidemia    Essential hypertension    Heart murmur    History of COVID-19 05/2020   per pt mild symptoms that resolved   History of ST elevation myocardial infarction (STEMI) 02/16/2009   inferior STEMI   cath w/ pci w/ BMS to pRCA   History of urinary retention    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[facility]) 04/2021   urologist--- dr borden/ radiation oncology-- dr patrcia;  dx 12/ 2022,  Gleason 4+5, PSA 24.6, volume 94.6cc;  plan to start IMRT   OSA (obstructive sleep apnea)    per pt dx approx 2008, no cpap intolerant   PONV (postoperative nausea and vomiting)    Pre-diabetes    S/p bare metal coronary artery stent    01/ 2006  x1 BMS to pRI (CoStar study stent);  and 09/ 2010  x1 BMS to pRCA   Status post total knee replacement, 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    Past Surgical History:  Procedure Laterality Date   BOWEL DECOMPRESSION N/A 02/18/2024   Procedure: DECOMPRESSION, INTESTINE;  Surgeon: Albertus Gordy HERO, MD;  Location: [facility] ENDOSCOPY;  Service: Gastroenterology;  Laterality: N/A;   CARDIAC CATHETERIZATION  05/27/2004   outpatient by dr burnard [facility] cardiology) for positive stress test;  80% stensis pRI   CARDIOVERSION N/A 02/21/2024   Procedure: CARDIOVERSION;  Surgeon: Cherrie Toribio SAUNDERS, MD;  Location: [facility] INVASIVE CV LAB;  Service: Cardiovascular;  Laterality: N/A;   COLONOSCOPY N/A 02/18/2024   Procedure: COLONOSCOPY;  Surgeon: Albertus Gordy HERO, MD;  Location: [facility] ENDOSCOPY;  Service: Gastroenterology;  Laterality: N/A;   CORONARY ANGIOPLASTY WITH STENT PLACEMENT  06/18/2004   @[facility]  by Dr Lavon;  PCI w/ BMS to pRI  (CoStar study stent - 2.5 x 16)   CORONARY ANGIOPLASTY WITH STENT PLACEMENT  02/16/2009   @[facility]  by dr verlin;   Inferior STEMI:  PCI w/ thrombectomy total occluded RCA, BMS x1 to  pRCA (Vision BMS 3.5 x 23 -> 4.0 mm), residual 40-50% pLAD, ef 45-50%   CORONARY PRESSURE/FFR STUDY N/A 01/17/2024   Procedure: CORONARY PRESSURE/FFR STUDY;  Surgeon: Mady Bruckner, MD;  Location: [facility] INVASIVE CV LAB;  Service: Cardiovascular;  Laterality: N/A;   CORONARY STENT INTERVENTION N/A 01/17/2024   Procedure: CORONARY STENT INTERVENTION;  Surgeon: Mady Bruckner, MD;  Location: [facility] INVASIVE CV LAB;  Service: Cardiovascular;  Laterality: N/A;   CORONARY ULTRASOUND/IVUS N/A 01/17/2024   Procedure: Coronary Ultrasound/IVUS;  Surgeon: Mady Bruckner, MD;  Location: [facility] INVASIVE CV LAB;  Service: Cardiovascular;  Laterality: N/A;   CORONARY/GRAFT ACUTE MI REVASCULARIZATION N/A 02/01/2024   Procedure: Coronary/Graft Acute MI Revascularization;  Surgeon: Elmira Newman PARAS, MD;  Location: [facility] INVASIVE CV LAB;  Service: Cardiovascular;  Laterality: N/A;   ESOPHAGOGASTRODUODENOSCOPY N/A 02/02/2024   Procedure: EGD (ESOPHAGOGASTRODUODENOSCOPY);  Surgeon: Nandigam, Kavitha V, MD;  Location: [facility] ENDOSCOPY;  Service: Gastroenterology;  Laterality: N/A;   FLEXIBLE SIGMOIDOSCOPY N/A 10/20/2023   Procedure: KINGSTON SIDE;  Surgeon: Legrand Victory LITTIE DOUGLAS, MD;  Location: [facility] ENDOSCOPY;  Service: Gastroenterology;  Laterality: N/A;  FLEXIBLE SIGMOIDOSCOPY N/A 02/02/2024   Procedure: KINGSTON SIDE;  Surgeon: Shila Gustav GAILS, MD;  Location: [facility] ENDOSCOPY;  Service: Gastroenterology;  Laterality: N/A;   GOLD SEED IMPLANT N/A 09/19/2021   Procedure: GOLD SEED IMPLANT;  Surgeon: Cam Morene ORN, MD;  Location: [facility];  Service: Urology;  Laterality: N/A;  ONLY NEEDS 30 MIN FOR ALL   INGUINAL HERNIA REPAIR Right 09/02/1999   @[facility] ;   recurrent repair right inguinal hernia;   previous repair approx 1990s   INGUINAL HERNIA REPAIR Left    1990s   KNEE ARTHROSCOPY W/ MENISCAL REPAIR Right 12/18/2005   @[facility]    LUMBAR DISC SURGERY  1982   L5--S1   RIGHT/LEFT HEART CATH AND  CORONARY ANGIOGRAPHY N/A 01/17/2024   Procedure: RIGHT/LEFT HEART CATH AND CORONARY ANGIOGRAPHY;  Surgeon: Mady Bruckner, MD;  Location: [facility] INVASIVE CV LAB;  Service: Cardiovascular;  Laterality: N/A;   SPACE OAR INSTILLATION N/A 09/19/2021   Procedure: SPACE OAR INSTILLATION;  Surgeon: Cam Morene ORN, MD;  Location: [facility];  Service: Urology;  Laterality: N/A;   TOTAL KNEE ARTHROPLASTY Left 10/01/2023   Procedure: ARTHROPLASTY, KNEE, TOTAL;  Surgeon: Kay Kemps, MD;  Location: [facility] ORS;  Service: Orthopedics;  Laterality: Left;   TRANSESOPHAGEAL ECHOCARDIOGRAM (CATH LAB) N/A 02/21/2024   Procedure: TRANSESOPHAGEAL ECHOCARDIOGRAM;  Surgeon: Cherrie Toribio SAUNDERS, MD;  Location[facility] INVASIVE CV LAB;  Service: Cardiovascular;  Laterality: N/A;    Family History  Problem Relation Age of Onset   Lung cancer Mother    Colon cancer Neg Hx    Esophageal cancer Neg Hx     Social History   Socioeconomic History   Marital status: Married    Spouse name: [name]   Number of children: 3   Years of education: Not on file   Highest education level: Not on file  Occupational History   Not on file  Tobacco Use   Smoking status: Never   Smokeless tobacco: Never  Vaping Use   Vaping status: Never Used  Substance and Sexual Activity   Alcohol use: No    Alcohol/week: 0.0 standard drinks of alcohol   Drug use: Never   Sexual activity: Not on file  Other Topics Concern   Not on file  Social History Narrative   He is married, father of 3, grandfather of 5. Never smoked. Does not drink.    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    He does lots of lifting, pulling with ropes, using upper extremities.       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   Social Drivers of Health    Tobacco Use: Low Risk (05/19/2024)   Patient History    Smoking Tobacco Use: Never    Smokeless Tobacco Use: Never    Passive Exposure: Not on file  Financial Resource Strain: Not on file  Food Insecurity: No Food Insecurity (05/20/2024)   Epic    Worried About Programme Researcher, Broadcasting/film/video in the Last Year: Never true    Ran Out of Food in the Last Year: Never true  Transportation Needs: Unknown (05/20/2024)   Epic    Lack of Transportation (Medical): No    Lack of Transportation (Non-Medical): Not on file  Physical Activity: Not  on file  Stress: Not on file  Social Connections: Moderately Integrated (05/20/2024)   Social Connection and Isolation Panel    Frequency of Communication with Friends and Family: Three times a week    Frequency of Social Gatherings with Friends and Family: Three times a week    Attends Religious Services: 1 to 4 times per year    Active Member of Clubs or Organizations: No    Attends Banker Meetings: Never    Marital Status: Married  Catering Manager Violence: Not At Risk (05/20/2024)   Epic    Fear of Current or Ex-Partner: No    Emotionally Abused: No    Physically Abused: No    Sexually Abused: No  Depression (PHQ2-9): Not on file  Alcohol Screen: Not on file  Housing: Low Risk (05/20/2024)   Epic    Unable to Pay for Housing in the Last Year: No    Number of Times Moved in the Last Year: 0    Homeless in the Last Year: No  Utilities: Not At Risk (05/20/2024)   Epic    Threatened with loss of utilities: No  Health Literacy: Not on file    Code Status   Code Status: Limited: Do not attempt resuscitation (DNR) -DNR-LIMITED -Do Not Intubate/DNI    Vina Dasen, NP-C   05/20/2024, 12:59 PM     "

## 2024-05-20 NOTE — Progress Notes (Signed)
 PHARMACY - ANTICOAGULATION CONSULT NOTE  Pharmacy Consult for Heparin  Indication: atrial fibrillation  Allergies[1]  Patient Measurements: Height: 6' (182.9 cm) Weight: 75.6 kg (166 lb 10.7 oz) IBW/kg (Calculated) : 77.6 Heparin  dosing weight: 75.6 kg  Vital Signs: Temp: 98.9 F (37.2 C) (12/27 0416) Temp Source: Oral (12/27 0416) BP: 94/63 (12/27 0416) Pulse Rate: 91 (12/27 0906)  Labs: Recent Labs    05/19/24 1326 05/19/24 1418 05/19/24 1550 05/19/24 1632 05/20/24 0739  HGB 13.8  --  14.3  --  12.3*  HCT 41.2  --  42.0  --  36.2*  PLT 480*  --   --   --  377  APTT  --  39*  --   --  51*  LABPROT  --  23.6*  --   --   --   INR  --  2.0*  --   --   --   HEPARINUNFRC  --   --   --   --  >1.10*  CREATININE  --   --  2.40* 1.96* 1.82*    Estimated Creatinine Clearance: 36.9 mL/min (A) (by C-G formula based on SCr of 1.82 mg/dL (H)).  Assessment: 62 yom with a history of HTN, T2DM, CAD, HF, AF on eliquis . Heparin  per pharmacy consult placed for atrial fibrillation.   Patient is on apixaban  prior to arrival. Last dose 12/25 pm or 12/26 am patient is unsure. Will require aPTT monitoring due to likely falsely high anti-Xa level secondary to DOAC use.  Initial aPTT is subtherapeutic (51 seconds) and heparin  level falsely high >1.10.  Goal of Therapy:  Heparin  level 0.3-0.7 units/ml aPTT 66-102 seconds Monitor platelets by anticoagulation protocol: Yes   Plan:  Increase heparin  drip to 1150 units/hr aPTT ~8 hrs after rate change. Daily aPTT and heparin  level until correlating; daily CBC. Apixaban  on hold.  [name], RPh 05/20/2024,10:02 AM      [1]  Allergies Allergen Reactions   Codeine Nausea And Vomiting   Latex Rash    Oral blisters when used at dentist

## 2024-05-20 NOTE — Plan of Care (Signed)

## 2024-05-20 NOTE — Progress Notes (Signed)
 " PROGRESS NOTE Chase Combs  FMW:[id number] DOB: February 23, 1946 DOA: 05/19/2024 PCP: Wonda Worth SQUIBB, PA  Brief Narrative/Hospital Course: Chase Combs is a 78 y.o. 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  He has not been able to keep oral intake and has chronic abdominal distention and nausea vomiting diarrhea. Recent prolonged admit 11/28-12/18- 2/2 recurrent Ogilvie's syndrome, severe malnutrition profound diarrhea> and discharged to SNF. In ED  afebrile and  spo2 mid 90s on  RA,  mild tachycardia and stable blood pressure. Labs-sodium 128, BUN 67, creatinine 1.96, normal WBC, troponin 49, and proBNP 10,122. CT is concerning for distal large bowel obstruction with transition point at the proximal sigmoid colon. GI was consulted by the ED, s/p ivf 500 cc LR and Reglan , and  NG tube and rectal tube ordered.  Subjective: Seen and examined today Patient reports he feels much better this morning, HOB elevated, no nausea vomiting chest pain shortness of breath Overnight afebrile, VSS-BP being soft 90s-100, Labs pending this morning-subsequently resulted with hyponatremia hypokalemia mag okay.  Assessment and plan:  Recurrent Pseudo-obstruction of colon/Ogilvie syndrome Recurrent abdominal pain distention nausea vomiting from above: CT finding reviewed labs reviewed, GI has been consulted will continue with NG tube decompression, rectal tube Clinically feels less painful no nausea vomiting. Continue gentle hydration cautiously given low EF, monitor electrolytes-replace potassium, keep K above 4 mag above 2  AKI on CKD stage II Hypovolemic hyponatremia Hypokalemia Metabolic acidosis Recent urinary obstruction: Patient has  electrolyte balance in the setting of nausea vomiting and obstruction, volume depletion. reatinine improving from 2.4> 1.9 in the setting of #1 with volume depletion gently hydrate cautiously with low EF.  Monitor electrolytes, urine output, avoid nephrotoxic medication.  Resume Flomax  once able Recent Labs    05/01/24 0355 05/01/24 1558 05/02/24 0408 05/03/24 0314 05/06/24 0610 05/09/24 0611 05/10/24 1206 05/11/24 0512 05/19/24 1550 05/19/24 1632 05/20/24 0739  BUN 18 16 14 11 8  7* 9 7* 70* 67* 74*  CREATININE 0.83 0.99 0.83 0.81 0.74 0.62 0.75 0.74 2.40* 1.96* 1.82*  CO2 26 31 19* 27 30 30 26 30   --  22 21*  K 2.2* 3.2* 2.9* 2.8* 2.4* 2.4* 3.2* 3.0* 3.2* 3.4* 3.0*   Intake/Output Summary (Last 24 hours) at 05/20/2024 1005 Last data filed at 05/20/2024 0724 Gross per 24 hour  Intake 0 ml  Output 200 ml  Net -200 ml    CAD Ischemic cardiomyopathy: Mildly elevated troponin but no chest pain.  Resume Plavix  when able to take p.o.  Chronic HFrEF Recent prolonged hospitalization complicated by cardiogenic shock with acute CHF with LAD stent stenosis 01/25/24: Last EF 20-25%, Global HKN on 02/21/24, proBNP at baseline 10,122-was 15,2454 in 06/26/24.  Watch for fluid overload PTA on Jardiance  recently digoxin  Entresto  was held due to softer blood pressure, Net IO Since Admission: -200 mL [05/20/24 1005]   Atrial fibrillation on Eliquis : P.o. meds on hold continue heparin  monitoring on telemetry  History of PUD: Continue PPI  T2DM: Last A1c normal 5.5 last month, blood sugar is stable  OSA: CPAP intolerance   Thyroid  nodules; renal lesions  Indeterminate thyroid  nodules and b/l renal lesions noted incidentally on CT in ED  Outpatient follow-up with thyroid  US  and renal protocol MRI recommended  Goals of care: Patient remains very high risk for readmission with complex medical comorbidities and at risk for acute decompensation, overall prognosis does not appear bright.He was  seen by palliative care on his last admission-he is DNR limited with full scope of intervention.  Confirmed DNR status, explained him his overall serious medical condition.  DVT prophylaxis: Heparin  Code Status:   Code Status: Limited: Do not attempt resuscitation (DNR) -DNR-LIMITED -Do Not Intubate/DNI  Family Communication: plan of care discussed with patient at bedside. Patient status is: Remains hospitalized because of severity of illness Level of care: Telemetry   Dispo: The patient is from: SNF [facility].  prior to SNF was living at  home w/ wife            Anticipated disposition: TBD Objective: Vitals last 24 hrs: Vitals:   05/20/24 0416 05/20/24 0444 05/20/24 0707 05/20/24 0906  BP: 94/63     Pulse: 93   91  Resp: 20   18  Temp: 98.9 F (37.2 C)     TempSrc: Oral     SpO2: 93%   94%  Weight:  75.6 kg    Height:   6' (1.829 m)     Physical Examination: General exam: alert awake, oriented,  NGT+ HEENT:Oral mucosa moist, Ear/Nose WNL grossly Respiratory system: Bilaterally clear BS,no use of accessory muscle Cardiovascular system: S1 & S2 +, No JVD. Gastrointestinal system: Abdomen soft, moderately distended, bowel sounds absent  Nervous System: Alert, awake, moving all extremities,and following commands. Extremities: extremities warm, leg edema TRACE Skin: Warm, no rashes MSK: Normal muscle bulk,tone, power   Medications reviewed:  Scheduled Meds:  budesonide   0.25 mg Nebulization BID   ipratropium  0.5 mg Nebulization BID   pantoprazole  (PROTONIX ) IV  40 mg Intravenous Q24H   sodium chloride  flush  3 mL Intravenous Q12H   Continuous Infusions:  sodium chloride  50 mL/hr at 05/19/24 2256   heparin  1,000 Units/hr (05/19/24 2313)   Diet: Diet Order             Diet NPO time specified  Diet effective now                   Data Reviewed: I have personally reviewed following labs and imaging studies ( see epic result tab) CBC: Recent Labs  Lab 05/19/24 1326  05/19/24 1550 05/20/24 0739  WBC 5.6  --  4.3  NEUTROABS 4.0  --   --   HGB 13.8 14.3 12.3*  HCT 41.2 42.0 36.2*  MCV 91.4  --  89.4  PLT 480*  --  377   CMP: Recent Labs  Lab 05/19/24 1326 05/19/24 1550 05/19/24 1632 05/20/24 0739  NA  --  130* 128* 129*  K  --  3.2* 3.4* 3.0*  CL  --  92* 91* 93*  CO2  --   --  22 21*  GLUCOSE  --  75 70 79  BUN  --  70* 67* 74*  CREATININE  --  2.40* 1.96* 1.82*  CALCIUM   --   --  8.2* 7.9*  MG 1.8  --   --  2.1   GFR: Estimated Creatinine Clearance: 36.9 mL/min (A) (by C-G formula based on SCr of 1.82 mg/dL (H)). Recent Labs  Lab 05/19/24 1632  AST 31  ALT 57*  ALKPHOS 106  BILITOT 0.4  PROT 5.6*  ALBUMIN 2.9*    Recent Labs  Lab 05/19/24 1632  LIPASE <10*   No results for input(s): AMMONIA  in the last 168 hours. Coagulation Profile:  Recent Labs  Lab 05/19/24 1418  INR 2.0*   Unresulted Labs (From admission, onward)     Start     Ordered   05/21/24 0500  APTT  Daily,   R      05/19/24 2055   05/21/24 0500  Heparin  level (unfractionated)  Daily,   R      05/19/24 2055   05/20/24 1800  APTT  Once-Timed,   TIMED        05/20/24 1001   05/20/24 0823  C Difficile Quick Screen w PCR reflex  (C Difficile quick screen w PCR reflex panel )  Once, for 24 hours,   TIMED       References:    CDiff Information Tool   05/20/24 0823   05/20/24 0500  Basic metabolic panel  Daily,   R      05/19/24 2046   05/20/24 0500  CBC  Daily,   R      05/19/24 2046           Antimicrobials/Microbiology: Anti-infectives (From admission, onward)    None         Component Value Date/Time   SDES BLOOD RIGHT HAND 01/22/2024 1248   SPECREQUEST  01/22/2024 1248    BOTTLES DRAWN AEROBIC ONLY Blood Culture results may not be optimal due to an inadequate volume of blood received in culture bottles   CULT  01/22/2024 1248    NO GROWTH 5 DAYS Performed at [facility], [address], [place], [place] [address]    REPTSTATUS  01/27/2024 FINAL 01/22/2024 1248    Procedures:    Mennie LAMY, MD [facility] 05/20/2024, 10:06 AM   "

## 2024-05-20 NOTE — Progress Notes (Signed)
 PHARMACY - ANTICOAGULATION CONSULT NOTE  Pharmacy Consult for Heparin  Indication: atrial fibrillation  Allergies[1]  Patient Measurements: Height: 6' (182.9 cm) Weight: 75.6 kg (166 lb 10.7 oz) IBW/kg (Calculated) : 77.6 Heparin  dosing weight: 75.6 kg  Vital Signs: Temp: 97.9 F (36.6 C) (12/27 1824) Temp Source: Oral (12/27 1824) BP: 102/63 (12/27 1824) Pulse Rate: 85 (12/27 1824)  Labs: Recent Labs    05/19/24 1326 05/19/24 1418 05/19/24 1550 05/19/24 1632 05/20/24 0739 05/20/24 1858  HGB 13.8  --  14.3  --  12.3*  --   HCT 41.2  --  42.0  --  36.2*  --   PLT 480*  --   --   --  377  --   APTT  --  39*  --   --  51* 52*  LABPROT  --  23.6*  --   --   --   --   INR  --  2.0*  --   --   --   --   HEPARINUNFRC  --   --   --   --  >1.10*  --   CREATININE  --   --  2.40* 1.96* 1.82*  --     Estimated Creatinine Clearance: 36.9 mL/min (A) (by C-G formula based on SCr of 1.82 mg/dL (H)).  Assessment: 14 yom with a history of HTN, T2DM, CAD, HF, AF on eliquis . Heparin  per pharmacy consult placed for atrial fibrillation.   Patient is on apixaban  prior to arrival. Last dose 12/25 pm or 12/26 am patient is unsure. Will require aPTT monitoring due to likely falsely high anti-Xa level secondary to DOAC use.  aPTT tonight came back subtherapeutic at 52, on 1150 units/hr. No s/sx of bleeding or infusion issues.   Goal of Therapy:  Heparin  level 0.3-0.7 units/ml aPTT 66-102 seconds Monitor platelets by anticoagulation protocol: Yes   Plan:  Increase heparin  drip to 1350 units/hr aPTT ~8 hrs with AM labs  Daily aPTT and heparin  level until correlating; daily CBC. Apixaban  on hold.  Thank you for allowing pharmacy to participate in this patient's care,  Suzen Sour, PharmD, BCCCP Clinical Pharmacist  Phone: [phone] 05/20/2024 7:31 PM  Please check AMION for all [facility] phone numbers After 10:00 PM, call Main Pharmacy [phone]       [1]   Allergies Allergen Reactions   Codeine Nausea And Vomiting   Latex Rash    Oral blisters when used at dentist

## 2024-05-20 NOTE — Hospital Course (Addendum)
 Chase Combs is a 78 y.o. 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  He has not been able to keep oral intake and has chronic abdominal distention and nausea vomiting diarrhea. Recent prolonged admit 11/28-12/18- 2/2 recurrent Ogilvie's syndrome, severe malnutrition profound diarrhea> and discharged to SNF. In ED  afebrile and  spo2 mid 90s on  RA,  mild tachycardia and stable blood pressure. Labs-sodium 128, BUN 67, creatinine 1.96, normal WBC, troponin 49, and proBNP 10,122.  CT is concerning for distal large bowel obstruction with transition point at the proximal sigmoid colon. GI was consulted by the ED, s/p ivf 500 cc LR and Reglan , and  NG tube and rectal tube ordered. Managed with conservative treatment, NG clamped and subsequently removed 12/29  Assessment and plan  Recurrent Pseudo-obstruction of colon Ogilvie syndrome Recurrent abdominal pain Nausea/vomiting - S/P NG tube decompression, overall with some clinical improvement and NG tube removed 12/29 and diet started - s/p relistor  - last xray still marked distension as well as on exam; hyperpitched and tinkling sounds in abdomen still - continue trending K and Mag - tolerating diet so far as well  -rectal tube now out; having multiple BM in briefs and bed still; needs ongoing PT in order to regain some strength so he can get to bathroom more frequently and easily also  - GI continuing on mestinon  course; patient does seem to have softer abdomen  AKI on CKD stage II Hypovolemic hyponatremia Hypokalemia Metabolic acidosis Recent urinary obstruction -Will continue replating electrolytes as necessary - Maintaining adequate intake on diet -No longer needing IV  fluids  CAD Ischemic cardiomyopathy Mildly elevated troponin but no chest pain - Continue Plavix   Chronic HFrEF Recent prolonged hospitalization complicated by cardiogenic shock with acute CHF with LAD stent stenosis 01/25/24: Last EF 20-25%, Global HKN on 02/21/24, proBNP at baseline 10,122-was 15,2454 in 06/26/24.   PTA on Jardiance , Aldactone ,recently digoxin  Entresto  was held due to softer blood pressure Cardiology following close- not to resume digoxin  on d/c  Atrial fibrillation on Eliquis  Was on heparin > switched to oral Eliquis  as taking po. Cont amiodarone , monitor in telemetry  History of PUD Continue PPI p.o  T2DM Last A1c normal 5.5 last month, blood sugar is stable  OSA CPAP intolerance   Thyroid  nodules; renal lesions  Indeterminate thyroid  nodules and b/l renal lesions noted incidentally on CT in ED  Outpatient follow-up with thyroid  US  and renal protocol MRI recommended   Goals of care Patient remains very high risk for readmission with complex medical comorbidities and at risk for acute decompensation, overall prognosis does not appear bright.He was seen by palliative care on his last admission-he is DNR limited with full scope of intervention.  Depression Anxiety Mood is stable, continue SSRI, prn Atarax   Deconditioning PT Orders: Active PT Follow up Rec: Home Health Pt (If Spouse Unable To Provide Required Physical Assist, Could Consider Snf)05/27/2024 1700

## 2024-05-20 NOTE — Plan of Care (Signed)
" °  Problem: Education: Goal: Knowledge of General Education information will improve Description: Including pain rating scale, medication(s)/side effects and non-pharmacologic comfort measures 05/20/2024 1910 by Jearlean Morene FERNS, RN Outcome: Progressing 05/20/2024 1909 by Jearlean Morene FERNS, RN Outcome: Progressing   Problem: Health Behavior/Discharge Planning: Goal: Ability to manage health-related needs will improve 05/20/2024 1910 by Jearlean Morene FERNS, RN Outcome: Progressing 05/20/2024 1909 by Jearlean Morene I, RN Outcome: Progressing   Problem: Clinical Measurements: Goal: Ability to maintain clinical measurements within normal limits will improve 05/20/2024 1910 by Jearlean Morene FERNS, RN Outcome: Progressing 05/20/2024 1909 by Jearlean Morene I, RN Outcome: Progressing Goal: Will remain free from infection 05/20/2024 1910 by Jearlean Morene FERNS, RN Outcome: Progressing 05/20/2024 1909 by Jearlean Morene I, RN Outcome: Progressing Goal: Diagnostic test results will improve 05/20/2024 1910 by Jearlean Morene FERNS, RN Outcome: Progressing 05/20/2024 1909 by Jearlean Morene I, RN Outcome: Progressing Goal: Respiratory complications will improve 05/20/2024 1910 by Jearlean Morene FERNS, RN Outcome: Progressing 05/20/2024 1909 by Jearlean Morene I, RN Outcome: Progressing Goal: Cardiovascular complication will be avoided 05/20/2024 1910 by Jearlean Morene FERNS, RN Outcome: Progressing 05/20/2024 1909 by Jearlean Morene FERNS, RN Outcome: Progressing   Problem: Activity: Goal: Risk for activity intolerance will decrease 05/20/2024 1910 by Jearlean Morene FERNS, RN Outcome: Progressing 05/20/2024 1909 by Jearlean Morene FERNS, RN Outcome: Progressing   "

## 2024-05-21 ENCOUNTER — Inpatient Hospital Stay (HOSPITAL_COMMUNITY)

## 2024-05-21 DIAGNOSIS — R112 Nausea with vomiting, unspecified: Secondary | ICD-10-CM | POA: Diagnosis not present

## 2024-05-21 DIAGNOSIS — R197 Diarrhea, unspecified: Secondary | ICD-10-CM | POA: Diagnosis not present

## 2024-05-21 DIAGNOSIS — K5981 Ogilvie syndrome: Secondary | ICD-10-CM | POA: Diagnosis not present

## 2024-05-21 DIAGNOSIS — R109 Unspecified abdominal pain: Secondary | ICD-10-CM | POA: Diagnosis not present

## 2024-05-21 LAB — BASIC METABOLIC PANEL WITH GFR
Anion gap: 12 (ref 5–15)
BUN: 44 mg/dL — ABNORMAL HIGH (ref 8–23)
CO2: 17 mmol/L — ABNORMAL LOW (ref 22–32)
Calcium: 7.7 mg/dL — ABNORMAL LOW (ref 8.9–10.3)
Chloride: 105 mmol/L (ref 98–111)
Creatinine, Ser: 0.95 mg/dL (ref 0.61–1.24)
GFR, Estimated: >60 mL/min
Glucose, Bld: 48 mg/dL — ABNORMAL LOW (ref 70–99)
Potassium: 3 mmol/L — ABNORMAL LOW (ref 3.5–5.1)
Sodium: 134 mmol/L — ABNORMAL LOW (ref 135–145)

## 2024-05-21 LAB — POTASSIUM: Potassium: 4.4 mmol/L (ref 3.5–5.1)

## 2024-05-21 LAB — CBC
HCT: 34.7 % — ABNORMAL LOW (ref 39.0–52.0)
Hemoglobin: 11.3 g/dL — ABNORMAL LOW (ref 13.0–17.0)
MCH: 30 pg (ref 26.0–34.0)
MCHC: 32.6 g/dL (ref 30.0–36.0)
MCV: 92 fL (ref 80.0–100.0)
Platelets: 305 K/uL (ref 150–400)
RBC: 3.77 MIL/uL — ABNORMAL LOW (ref 4.22–5.81)
RDW: 17.3 % — ABNORMAL HIGH (ref 11.5–15.5)
WBC: 4.3 K/uL (ref 4.0–10.5)
nRBC: 0 % (ref 0.0–0.2)

## 2024-05-21 LAB — APTT
aPTT: 64 s — ABNORMAL HIGH (ref 24–36)
aPTT: 82 s — ABNORMAL HIGH (ref 24–36)

## 2024-05-21 LAB — HEPARIN LEVEL (UNFRACTIONATED): Heparin Unfractionated: >1.1 [IU]/mL — ABNORMAL HIGH (ref 0.30–0.70)

## 2024-05-21 MED ORDER — POTASSIUM CHLORIDE 10 MEQ/100ML IV SOLN
10.0000 meq | INTRAVENOUS | Status: AC
  Administered 2024-05-21 (×6): 10 meq via INTRAVENOUS
  Filled 2024-05-21 (×6): qty 100

## 2024-05-21 MED ORDER — ZINC OXIDE 40 % EX OINT
TOPICAL_OINTMENT | Freq: Four times a day (QID) | CUTANEOUS | Status: DC | PRN
  Filled 2024-05-21: qty 57

## 2024-05-21 MED ORDER — POTASSIUM CHLORIDE IN NACL 20-0.9 MEQ/L-% IV SOLN: INTRAVENOUS | Status: AC

## 2024-05-21 NOTE — Progress Notes (Signed)
 PHARMACY - ANTICOAGULATION CONSULT NOTE  Pharmacy Consult for Heparin  Indication: atrial fibrillation  Allergies[1]  Patient Measurements: Height: 6' (182.9 cm) Weight: 68 kg (149 lb 14.6 oz) IBW/kg (Calculated) : 77.6 Heparin  dosing weight: 68 kg  Vital Signs: Temp: 98.2 F (36.8 C) (12/28 1850) Temp Source: Oral (12/28 1850) BP: 102/59 (12/28 1850) Pulse Rate: 85 (12/28 1850)  Labs: Recent Labs    05/19/24 1326 05/19/24 1326 05/19/24 1418 05/19/24 1550 05/19/24 1632 05/20/24 0739 05/20/24 1858 05/21/24 0826 05/21/24 1931  HGB 13.8  --   --  14.3  --  12.3*  --  11.3*  --   HCT 41.2  --   --  42.0  --  36.2*  --  34.7*  --   PLT 480*  --   --   --   --  377  --  305  --   APTT  --    < > 39*  --   --  51* 52* 64* 82*  LABPROT  --   --  23.6*  --   --   --   --   --   --   INR  --   --  2.0*  --   --   --   --   --   --   HEPARINUNFRC  --   --   --   --   --  >1.10*  --  >1.10*  --   CREATININE  --    < >  --  2.40* 1.96* 1.82*  --  0.95  --    < > = values in this interval not displayed.    Estimated Creatinine Clearance: 63.6 mL/min (by C-G formula based on SCr of 0.95 mg/dL).  Assessment: 60 yom with a history of HTN, T2DM, CAD, HF, AF on eliquis . Heparin  per pharmacy consult placed for atrial fibrillation.   Patient is on apixaban  prior to arrival. Last dose 12/25 pm or 12/26 am patient is unsure. Will require aPTT monitoring due to likely falsely high anti-Xa level secondary to DOAC use.  12/28 PM: aPTT therapeutic, continue current rate  Goal of Therapy:  Heparin  level 0.3-0.7 units/ml aPTT 66-102 seconds Monitor platelets by anticoagulation protocol: Yes   Plan:  Continue heparin  drip at 1450 units/hr Daily aPTT and heparin  level until correlating; daily CBC. Apixaban  on hold.  Larraine CHRISTELLA Brazier, RPh 05/21/2024,8:03 PM        [1]  Allergies Allergen Reactions   Codeine Nausea And Vomiting   Latex Rash    Oral blisters when used at  dentist

## 2024-05-21 NOTE — Progress Notes (Signed)
 " PROGRESS NOTE VUE PAVON  FMW:[id number] DOB: 1945-11-27 DOA: 05/19/2024 PCP: Wonda Worth SQUIBB, PA  Brief Narrative/Hospital Course: Chase Combs is a 78 y.o. 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  He has not been able to keep oral intake and has chronic abdominal distention and nausea vomiting diarrhea. Recent prolonged admit 11/28-12/18- 2/2 recurrent Ogilvie's syndrome, severe malnutrition profound diarrhea> and discharged to SNF. In ED  afebrile and  spo2 mid 90s on  RA,  mild tachycardia and stable blood pressure. Labs-sodium 128, BUN 67, creatinine 1.96, normal WBC, troponin 49, and proBNP 10,122. CT is concerning for distal large bowel obstruction with transition point at the proximal sigmoid colon. GI was consulted by the ED, s/p ivf 500 cc LR and Reglan , and  NG tube and rectal tube ordered.  Subjective: Seen and examined  Overall feeling better, abdomen is less distended, soft. Overnight afebrile, BP remains stable, on RA, labs shows creatinine has improved to 0.9 but persistently low potassium, mild hyponatremia  NG output 900 cc   Assessment and plan:  Recurrent Pseudo-obstruction of colon/Ogilvie syndrome Recurrent abdominal pain distention nausea vomiting from above: GI o/b- continue NG tube decompression, repeat x-ray pending.  Per GI could consider administering p.o. Mestinon and clamping NG tube for short period of time ?Relistor . His abdomen is less distended and softer today. Continue gentle hydration cautiously given low EF, monitor electrolytes-replace potassium, keep K above 4 mag above 2  AKI on CKD stage II Hypovolemic hyponatremia Hypokalemia Metabolic acidosis Recent urinary  obstruction: Patient has electrolyte balance in the setting of nausea vomiting and obstruction, volume depletion. AKI resolved, bicarb remains low along with potassium and will be replacing aggressively w/ kcl in ivf  Recheck labs later today. cautiously with low EF.Avoid nephrotoxic medication.  Resume Flomax  once able Recent Labs    05/01/24 1558 05/02/24 0408 05/03/24 0314 05/06/24 0610 05/09/24 9388 05/10/24 1206 05/11/24 0512 05/19/24 1550 05/19/24 1632 05/20/24 0739 05/21/24 0826  BUN 16 14 11 8  7* 9 7* 70* 67* 74* 44*  CREATININE 0.99 0.83 0.81 0.74 0.62 0.75 0.74 2.40* 1.96* 1.82* 0.95  CO2 31 19* 27 30 30 26 30   --  22 21* 17*  K 3.2* 2.9* 2.8* 2.4* 2.4* 3.2* 3.0* 3.2* 3.4* 3.0* 3.0*   Intake/Output Summary (Last 24 hours) at 05/21/2024 1203 Last data filed at 05/21/2024 0942 Gross per 24 hour  Intake 1436.79 ml  Output 1400 ml  Net 36.79 ml    CAD Ischemic cardiomyopathy: Mildly elevated troponin but no chest pain.Resume Plavix  when able to take p.o.  Chronic HFrEF Recent prolonged hospitalization complicated by cardiogenic shock with acute CHF with LAD stent stenosis 01/25/24: Last EF 20-25%, Global HKN on 02/21/24, proBNP at baseline 10,122-was 15,2454 in 06/26/24.   PTA on Jardiance  recently digoxin  Entresto  was held due to softer blood pressure. On gentle IV rehydration while on NGT decompression n.p.o. Net IO Since Admission: -163.21 mL [05/21/24 1203]   Atrial fibrillation on Eliquis : P.o. meds on hold, remains in sinus rhythm.  Continue heparin  while Eliquis  on hold  History of PUD: Continue PPI  T2DM: Last A1c normal 5.5 last month, blood sugar is stable  OSA: CPAP intolerance   Thyroid  nodules; renal lesions  Indeterminate thyroid  nodules  and b/l renal lesions noted incidentally on CT in ED  Outpatient follow-up with thyroid  US  and renal protocol MRI recommended   Goals of care: Patient remains very high risk for readmission with complex medical  comorbidities and at risk for acute decompensation, overall prognosis does not appear bright.He was seen by palliative care on his last admission-he is DNR limited with full scope of intervention.  Confirmed DNR status, explained him his overall serious medical condition.  DVT prophylaxis: Heparin  Code Status:   Code Status: Limited: Do not attempt resuscitation (DNR) -DNR-LIMITED -Do Not Intubate/DNI  Family Communication: plan of care discussed with patient at bedside. Patient status is: Remains hospitalized because of severity of illness Level of care: Telemetry   Dispo: The patient is from: SNF [facility].  prior to SNF was living at  home w/ wife            Anticipated disposition: TBD Objective: Vitals last 24 hrs: Vitals:   05/20/24 2111 05/20/24 2224 05/21/24 0500 05/21/24 1048  BP: 111/60   (!) 101/59  Pulse: 87   81  Resp: 20   16  Temp: 98.8 F (37.1 C)   97.7 F (36.5 C)  TempSrc:    Oral  SpO2:  98%  96%  Weight:   68 kg   Height:        Physical Examination: General exam: AAOX3 NGT + HEENT:Oral mucosa moist, Ear/Nose WNL grossly Respiratory system: Bilaterally clear BS,no use of accessory muscle Cardiovascular system: S1 & S2 +, No JVD. Gastrointestinal system: Abdomen remains distended but less than yesterday and are soft , rectal tube NG tube present  Nervous System: Alert, awake, moving all extremities,and following commands. Extremities: extremities warm, leg edema mild Skin: Warm, no rashes MSK: Normal muscle bulk,tone, power   Medications reviewed:  Scheduled Meds:  budesonide   0.25 mg Nebulization BID   ipratropium  0.5 mg Nebulization BID   pantoprazole  (PROTONIX ) IV  40 mg Intravenous Q24H   sodium chloride  flush  3 mL Intravenous Q12H   Continuous Infusions:  0.9 % NaCl with KCl 20 mEq / L 50 mL/hr at 05/21/24 0756   heparin  1,450 Units/hr (05/21/24 0941)   potassium chloride  10 mEq (05/21/24 1101)   Diet: Diet Order             Diet NPO time  specified  Diet effective now                   Data Reviewed: I have personally reviewed following labs and imaging studies ( see epic result tab) CBC: Recent Labs  Lab 05/19/24 1326 05/19/24 1550 05/20/24 0739 05/21/24 0826  WBC 5.6  --  4.3 4.3  NEUTROABS 4.0  --   --   --   HGB 13.8 14.3 12.3* 11.3*  HCT 41.2 42.0 36.2* 34.7*  MCV 91.4  --  89.4 92.0  PLT 480*  --  377 305   CMP: Recent Labs  Lab 05/19/24 1326 05/19/24 1550 05/19/24 1632 05/20/24 0739 05/21/24 0826  NA  --  130* 128* 129* 134*  K  --  3.2* 3.4* 3.0* 3.0*  CL  --  92* 91* 93* 105  CO2  --   --  22 21* 17*  GLUCOSE  --  75 70 79 48*  BUN  --  70* 67* 74* 44*  CREATININE  --  2.40* 1.96* 1.82* 0.95  CALCIUM   --   --  8.2* 7.9* 7.7*  MG 1.8  --   --  2.1  --    GFR: Estimated Creatinine Clearance: 63.6 mL/min (by C-G formula based on SCr of 0.95 mg/dL). Recent Labs  Lab 05/19/24 1632  AST 31  ALT 57*  ALKPHOS 106  BILITOT 0.4  PROT 5.6*  ALBUMIN 2.9*    Recent Labs  Lab 05/19/24 1632  LIPASE <10*   No results for input(s): AMMONIA in the last 168 hours. Coagulation Profile:  Recent Labs  Lab 05/19/24 1418  INR 2.0*   Unresulted Labs (From admission, onward)     Start     Ordered   05/22/24 0500  APTT  Daily,   R      05/20/24 1939   05/22/24 0500  Heparin  level (unfractionated)  Daily,   R      05/20/24 1939   05/21/24 1800  APTT  Once-Timed,   TIMED        05/21/24 0925   05/21/24 1800  Potassium  Once-Timed,   TIMED       Comments: 2 hours after last potassium 10 mEq dose today. May modify timing of heparin  level to draw with K+    05/21/24 0939   05/20/24 0500  Basic metabolic panel  Daily,   R      05/19/24 2046   05/20/24 0500  CBC  Daily,   R      05/19/24 2046           Antimicrobials/Microbiology: Anti-infectives (From admission, onward)    None         Component Value Date/Time   SDES BLOOD RIGHT HAND 01/22/2024 1248   SPECREQUEST  01/22/2024  1248    BOTTLES DRAWN AEROBIC ONLY Blood Culture results may not be optimal due to an inadequate volume of blood received in culture bottles   CULT  01/22/2024 1248    NO GROWTH 5 DAYS Performed at [facility], [address], [place], [place] [address]    REPTSTATUS 01/27/2024 FINAL 01/22/2024 1248    Procedures:    Mennie LAMY, MD [facility] 05/21/2024, 12:03 PM   "

## 2024-05-21 NOTE — Progress Notes (Signed)
 PHARMACY - ANTICOAGULATION CONSULT NOTE  Pharmacy Consult for Heparin  Indication: atrial fibrillation  Allergies[1]  Patient Measurements: Height: 6' (182.9 cm) Weight: 68 kg (149 lb 14.6 oz) IBW/kg (Calculated) : 77.6 Heparin  dosing weight: 68 kg  Vital Signs:    Labs: Recent Labs    05/19/24 1326 05/19/24 1326 05/19/24 1418 05/19/24 1550 05/19/24 1632 05/20/24 0739 05/20/24 1858 05/21/24 0826  HGB 13.8  --   --  14.3  --  12.3*  --  11.3*  HCT 41.2  --   --  42.0  --  36.2*  --  34.7*  PLT 480*  --   --   --   --  377  --  305  APTT  --    < > 39*  --   --  51* 52* 64*  LABPROT  --   --  23.6*  --   --   --   --   --   INR  --   --  2.0*  --   --   --   --   --   HEPARINUNFRC  --   --   --   --   --  >1.10*  --  >1.10*  CREATININE  --    < >  --  2.40* 1.96* 1.82*  --  0.95   < > = values in this interval not displayed.    Estimated Creatinine Clearance: 63.6 mL/min (by C-G formula based on SCr of 0.95 mg/dL).  Assessment: 62 yom with a history of HTN, T2DM, CAD, HF, AF on eliquis . Heparin  per pharmacy consult placed for atrial fibrillation.   Patient is on apixaban  prior to arrival. Last dose 12/25 pm or 12/26 am patient is unsure. Will require aPTT monitoring due to likely falsely high anti-Xa level secondary to DOAC use.  aPTT is now just subtherapeutic (64 seconds) and heparin  level falsely high >1.10 on 1350 units/hr.   Goal of Therapy:  Heparin  level 0.3-0.7 units/ml aPTT 66-102 seconds Monitor platelets by anticoagulation protocol: Yes   Plan:  Increase heparin  drip to 1450 units/hr aPTT ~8 hrs after rate change. Daily aPTT and heparin  level until correlating; daily CBC. Apixaban  on hold.  Chase Combs, RPh 05/21/2024,9:51 AM       [1]  Allergies Allergen Reactions   Codeine Nausea And Vomiting   Latex Rash    Oral blisters when used at dentist

## 2024-05-21 NOTE — Evaluation (Signed)
 Occupational Therapy Evaluation Patient Details Name: Chase Combs MRN: [id number] DOB: 25-Mar-1946 Today's Date: 05/21/2024   History of Present Illness   Pt is a 78 y.o male admitted from [facility] for N/V/D. 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 PMH: NSTEMI, CAD, heart failure, a fib, HTN, HLD, DMII, CKD, OSA, prostate cancer, Ogilvie syndrome     Clinical Impressions Pt admitted based on above, and was seen based on problem list below. PTA pt was at [facility] completing skilled rehab. He reports working on mobility with RW, and ADLs. Today pt is requiring set up  to min  assist for ADLs. Bed mobility was s for safety. Eval limited to EOB d/t pt fatigue and concerns regarding lines. Pt and family hopeful to d/c home, with HHOT. Pt with adequate family support and DME, believe this goal is attainable. OT will continue to follow acutely to maximize functional independence.     If plan is discharge home, recommend the following:   A lot of help with walking and/or transfers;A lot of help with bathing/dressing/bathroom;Assistance with cooking/housework;Assist for transportation     Functional Status Assessment   Patient has had a recent decline in their functional status and demonstrates the ability to make significant improvements in function in a reasonable and predictable amount of time.     Equipment Recommendations   None recommended by OT      Precautions/Restrictions   Precautions Precautions: Fall;Other (comment) Recall of Precautions/Restrictions: Intact Precaution/Restrictions Comments: watch BP, NG tube, rectal pouch Restrictions Weight Bearing Restrictions Per Provider Order: No     Mobility Bed Mobility Overal bed mobility: Needs Assistance Bed Mobility: Supine to Sit, Sit to Supine     Supine to sit: HOB elevated, Supervision Sit to supine: HOB elevated, Supervision   General bed mobility comments: S for safety with line  management    Transfers     General transfer comment: Pt politely declining      Balance Overall balance assessment: Needs assistance Sitting-balance support: Feet supported Sitting balance-Leahy Scale: Good     ADL either performed or assessed with clinical judgement   ADL Overall ADL's : Needs assistance/impaired Eating/Feeding: Set up;Sitting   Grooming: Set up;Sitting   Upper Body Bathing: Set up;Sitting   Lower Body Bathing: Minimal assistance   Upper Body Dressing : Set up;Sitting   Lower Body Dressing: Minimal assistance Lower Body Dressing Details (indicate cue type and reason): Able to figure 4 legs in bed     Toileting- Clothing Manipulation and Hygiene: Total assistance Toileting - Clothing Manipulation Details (indicate cue type and reason): Rectal tube       General ADL Comments: Limited to EOB at pt's request     Vision Baseline Vision/History: 0 No visual deficits Vision Assessment?: No apparent visual deficits            Pertinent Vitals/Pain Pain Assessment Pain Assessment: Faces Faces Pain Scale: Hurts a little bit Pain Location: generalized with mobility Pain Descriptors / Indicators: Grimacing Pain Intervention(s): Limited activity within patient's tolerance     Extremity/Trunk Assessment Upper Extremity Assessment Upper Extremity Assessment: Generalized weakness   Lower Extremity Assessment Lower Extremity Assessment: Defer to PT evaluation       Communication Communication Communication: No apparent difficulties   Cognition Arousal: Alert Behavior During Therapy: Flat affect Cognition: No apparent impairments     Following commands: Intact       Cueing  General Comments   Cueing Techniques: Verbal cues  Family present for session  and supportive           Home Living Family/patient expects to be discharged to:: Private residence Living Arrangements: Spouse/significant other Available Help at Discharge:  Family;Available 24 hours/day Type of Home: House Home Access: Ramped entrance     Home Layout: One level     Bathroom Shower/Tub: Chief Strategy Officer: Handicapped height     Home Equipment: Agricultural Consultant (2 wheels);Rollator (4 wheels);Cane - single point;Lift chair;Grab bars - toilet;Tub bench;Wheelchair - It Trainer          Prior Functioning/Environment Prior Level of Function : Needs assist     Mobility Comments: Was at [facility] completing rehab. Reports short distances with RW, use of w/c for distances ADLs Comments: Was at [facility] working with rehab reports assistance for LB ADLs    OT Problem List: Decreased strength;Decreased range of motion;Decreased activity tolerance;Impaired balance (sitting and/or standing);Decreased cognition;Decreased safety awareness   OT Treatment/Interventions: Self-care/ADL training;Therapeutic exercise;Energy conservation;DME and/or AE instruction;Therapeutic activities;Patient/family education;Balance training      OT Goals(Current goals can be found in the care plan section)   Acute Rehab OT Goals Patient Stated Goal: To get better OT Goal Formulation: With patient Time For Goal Achievement: 06/04/24 Potential to Achieve Goals: Good   OT Frequency:  Min 2X/week       AM-PAC OT 6 Clicks Daily Activity     Outcome Measure Help from another person eating meals?: None Help from another person taking care of personal grooming?: A Little Help from another person toileting, which includes using toliet, bedpan, or urinal?: Total Help from another person bathing (including washing, rinsing, drying)?: A Lot Help from another person to put on and taking off regular upper body clothing?: A Little Help from another person to put on and taking off regular lower body clothing?: A Lot 6 Click Score: 15   End of Session Nurse Communication: Mobility status  Activity Tolerance: Patient limited by fatigue Patient  left: in bed;with call bell/phone within reach;with bed alarm set;with family/visitor present  OT Visit Diagnosis: Other abnormalities of gait and mobility (R26.89);Muscle weakness (generalized) (M62.81);Unsteadiness on feet (R26.81)                Time: 8587-8568 OT Time Calculation (min): 19 min Charges:  OT General Charges $OT Visit: 1 Visit OT Evaluation $OT Eval Moderate Complexity: 1 Mod  Adrianne BROCKS, OT  Acute Rehabilitation Services Office [phone] Secure chat preferred   Adrianne GORMAN Savers 05/21/2024, 3:53 PM

## 2024-05-21 NOTE — Progress Notes (Addendum)
 I have taken history, reviewed the chart and examined the patient. I performed a substantive portion of this encounter, including complete performance of at least one of the key components, in conjunction with the APP. I agree with the Advanced Practitioner's note, impression and recommendations.   [name] abdomen is softer today.  Also having less diarrhea.  Denies abdominal pain. Labs continue to show hypokalemia with potassium of 3.0.  Electrolyte abnormalities may be contributing to ongoing ileus and colonic pseudoobstruction.  Follow-up AXR performed today.  Report not uploaded.  By my review there is still chronically distended bowel loops.  Since he is feeling better today, I agree with attempts at a clamp trial and sips of clear fluids.  Once he is able to tolerate oral intake could consider trial of Mestinon 30 mg p.o. twice daily which he could potentially take in the long-term as an outpatient if needed.  Will need to continue monitoring potassiujm, magnesium  and phosphorus-maintaining normal levels to limit bowel dysfunction.  Dr. Legrand and Chase Corti, PA will assume rounding responsibilities for [facility] 05/22/2024   Chase Hausen, MD [facility]          Daily Progress Note  DOA: 05/19/2024 Hospital Day: 3  Cc:  Ileus / pseudo-obstruction  ASSESSMENT  & PLAN   78 year old male with multiple medical problems including an extensive cardiac history admitted with  hypotension, nausea, diarrhea , abdominal pain  /  distention and recurrent ileus  / pseudo-obstruction with transition point at sigmoid colon on CT scan.  Colonic obstruction not suspected, patient has had sigmoidoscopies x 2 this year. This has been a chronic intermittent problem exacerbated by illness / medications and possible decreased gut perfusion. Multiple previous treatments ( prior admits) have included miralax , enema, relistor , neostigmine  and colonic decompression ( Sept 2025).  TODAY:  KUB  pending but clinically looks better. No NGT output today. Abdomen less distended. He feels better. No nausea, no significant abdominal pain. Asking for liquids.  He was having diarrhea yesterday Will clamp NG tube.  If no nausea, abdominal distention over the next couple hours then can try clear liquids.  Monitor electrolytes. K+ repletion is in progress Await today's KUB results Revaluate in am.  He may continue to improve with electrolyte replacement alone. If not then consider trial of p.o. Mestinon.  Thankfully he is not requiring any narcotics Am KUB  Hypokalemia K+ still low at 3.0, repletion in progress, Mg+ normal.  History esophageal ulcer, grade C esophagitis on EGD on Sept 2025 Continue daily pantoprazole    AKI  Creatine has normalized.   Extensive cardiac history including CAD / NSTEMI  Chronic heart failure pBNP 10, 1221  DM 2   History of prostate cancer   Principal Problem:   Pseudo-obstruction of colon Active Problems:   Coronary artery disease involving native coronary artery of native heart without angina pectoris   Hypokalemia   Ogilvie syndrome   PUD (peptic ulcer disease)   Acute kidney injury   Paroxysmal atrial flutter (HCC)   Chronic combined systolic and diastolic congestive heart failure (HCC)   Thyroid  nodule   Renal lesion   Objective   Pertinent studies this admission  C. difficile antigen and toxin negative Non-contrast CT AP- Distal large bowel obstruction with transition at the proximal sigmoid colon and associated small bowel dilatation.  Recent Labs    05/19/24 1326 05/19/24 1550 05/20/24 0739 05/21/24 0826  WBC 5.6  --  4.3 4.3  HGB 13.8 14.3 12.3* 11.3*  HCT 41.2 42.0 36.2* 34.7*  MCV 91.4  --  89.4 92.0  PLT 480*  --  377 305   No results for input(s): FOLATE, VITAMINB12, FERRITIN, TIBC, IRONPCTSAT in the last 72 hours. Recent Labs    05/19/24 1632 05/20/24 0739 05/21/24 0826  NA 128* 129* 134*  K 3.4*  3.0* 3.0*  CL 91* 93* 105  CO2 22 21* 17*  GLUCOSE 70 79 48*  BUN 67* 74* 44*  CREATININE 1.96* 1.82* 0.95  CALCIUM  8.2* 7.9* 7.7*   Recent Labs    05/19/24 1632  PROT 5.6*  ALBUMIN 2.9*  AST 31  ALT 57*  ALKPHOS 106  BILITOT 0.4     Scheduled inpatient medications:   budesonide   0.25 mg Nebulization BID   ipratropium  0.5 mg Nebulization BID   pantoprazole  (PROTONIX ) IV  40 mg Intravenous Q24H   sodium chloride  flush  3 mL Intravenous Q12H   Continuous inpatient infusions:   0.9 % NaCl with KCl 20 mEq / L 40 mL/hr at 05/21/24 1247   heparin  1,450 Units/hr (05/21/24 0941)   potassium chloride  10 mEq (05/21/24 1331)   PRN inpatient medications: fentaNYL  (SUBLIMAZE ) injection, ipratropium-albuterol , liver oil-zinc  oxide, prochlorperazine   Vital signs in last 24 hours: Temp:  [97.7 F (36.5 C)-98.8 F (37.1 C)] 97.7 F (36.5 C) (12/28 1048) Pulse Rate:  [81-87] 81 (12/28 1048) Resp:  [16-20] 16 (12/28 1048) BP: (101-111)/(59-63) 101/59 (12/28 1048) SpO2:  [95 %-98 %] 96 % (12/28 1048) Weight:  [68 kg] 68 kg (12/28 0500) Last BM Date : 05/20/24  Intake/Output Summary (Last 24 hours) at 05/21/2024 1334 Last data filed at 05/21/2024 0942 Gross per 24 hour  Intake 1436.79 ml  Output 1400 ml  Net 36.79 ml    Intake/Output from previous day: 12/27 0701 - 12/28 0700 In: 0  Out: 1600 [Urine:700; Emesis/NG output:900] Intake/Output this shift: Total I/O In: 1436.8 [I.V.:1436.8] Out: -    Physical Exam:  General: Alert male in NAD family visiting in room.  NG tube to wall suction.  Wall canister empty Heart:  Regular rate and rhythm.  Pulmonary: Normal respiratory effort Abdomen: Soft, moderately distended  (much less so compared to yesterday ), tympanitic , still with tinkling bowel sounds .nontender . Extremities: No lower extremity edema  Neurologic: Alert and oriented Psych: Pleasant. Cooperative     LOS: 2 days   Chase Combs ,NP 05/21/2024, 1:34  PM

## 2024-05-22 ENCOUNTER — Encounter (HOSPITAL_COMMUNITY): Payer: Self-pay | Admitting: Family Medicine

## 2024-05-22 DIAGNOSIS — G4730 Sleep apnea, unspecified: Secondary | ICD-10-CM | POA: Diagnosis not present

## 2024-05-22 DIAGNOSIS — N182 Chronic kidney disease, stage 2 (mild): Secondary | ICD-10-CM

## 2024-05-22 DIAGNOSIS — R140 Abdominal distension (gaseous): Secondary | ICD-10-CM | POA: Diagnosis not present

## 2024-05-22 DIAGNOSIS — I4891 Unspecified atrial fibrillation: Secondary | ICD-10-CM | POA: Diagnosis not present

## 2024-05-22 DIAGNOSIS — I5022 Chronic systolic (congestive) heart failure: Secondary | ICD-10-CM

## 2024-05-22 DIAGNOSIS — E119 Type 2 diabetes mellitus without complications: Secondary | ICD-10-CM

## 2024-05-22 DIAGNOSIS — I255 Ischemic cardiomyopathy: Secondary | ICD-10-CM

## 2024-05-22 DIAGNOSIS — K5981 Ogilvie syndrome: Secondary | ICD-10-CM | POA: Diagnosis not present

## 2024-05-22 LAB — BASIC METABOLIC PANEL WITH GFR
Anion gap: 8 (ref 5–15)
BUN: 23 mg/dL (ref 8–23)
CO2: 21 mmol/L — ABNORMAL LOW (ref 22–32)
Calcium: 7.8 mg/dL — ABNORMAL LOW (ref 8.9–10.3)
Chloride: 109 mmol/L (ref 98–111)
Creatinine, Ser: 0.62 mg/dL (ref 0.61–1.24)
GFR, Estimated: >60 mL/min
Glucose, Bld: 81 mg/dL (ref 70–99)
Potassium: 4.2 mmol/L (ref 3.5–5.1)
Sodium: 138 mmol/L (ref 135–145)

## 2024-05-22 LAB — CBC
HCT: 38.7 % — ABNORMAL LOW (ref 39.0–52.0)
Hemoglobin: 12.6 g/dL — ABNORMAL LOW (ref 13.0–17.0)
MCH: 29.9 pg (ref 26.0–34.0)
MCHC: 32.6 g/dL (ref 30.0–36.0)
MCV: 91.7 fL (ref 80.0–100.0)
Platelets: 350 K/uL (ref 150–400)
RBC: 4.22 MIL/uL (ref 4.22–5.81)
RDW: 17.4 % — ABNORMAL HIGH (ref 11.5–15.5)
WBC: 5.7 K/uL (ref 4.0–10.5)
nRBC: 0 % (ref 0.0–0.2)

## 2024-05-22 LAB — APTT: aPTT: 99 s — ABNORMAL HIGH (ref 24–36)

## 2024-05-22 LAB — HEPARIN LEVEL (UNFRACTIONATED): Heparin Unfractionated: >1.1 [IU]/mL — ABNORMAL HIGH (ref 0.30–0.70)

## 2024-05-22 MED ORDER — POLYETHYLENE GLYCOL 3350 17 G PO PACK
17.0000 g | PACK | Freq: Two times a day (BID) | ORAL | Status: DC
  Administered 2024-05-22 – 2024-05-31 (×10): 17 g via ORAL
  Filled 2024-05-22 (×18): qty 1

## 2024-05-22 MED ORDER — TAMSULOSIN HCL 0.4 MG PO CAPS
0.4000 mg | ORAL_CAPSULE | Freq: Every day | ORAL | Status: DC
  Administered 2024-05-22 – 2024-05-31 (×10): 0.4 mg via ORAL
  Filled 2024-05-22 (×10): qty 1

## 2024-05-22 MED ORDER — AMIODARONE HCL 200 MG PO TABS
200.0000 mg | ORAL_TABLET | Freq: Every day | ORAL | Status: DC
  Administered 2024-05-22 – 2024-06-01 (×11): 200 mg via ORAL
  Filled 2024-05-22 (×11): qty 1

## 2024-05-22 MED ORDER — CLOPIDOGREL BISULFATE 75 MG PO TABS
75.0000 mg | ORAL_TABLET | Freq: Every day | ORAL | Status: DC
  Administered 2024-05-22 – 2024-06-01 (×11): 75 mg via ORAL
  Filled 2024-05-22 (×11): qty 1

## 2024-05-22 MED ORDER — REVEFENACIN 175 MCG/3ML IN SOLN
175.0000 ug | Freq: Every day | RESPIRATORY_TRACT | Status: DC
  Administered 2024-05-23 – 2024-06-01 (×9): 175 ug via RESPIRATORY_TRACT
  Filled 2024-05-22 (×10): qty 3

## 2024-05-22 MED ORDER — METHYLNALTREXONE BROMIDE 12 MG/0.6ML ~~LOC~~ SOLN
8.0000 mg | SUBCUTANEOUS | Status: AC
  Administered 2024-05-22 – 2024-05-24 (×3): 8 mg via SUBCUTANEOUS
  Filled 2024-05-22 (×3): qty 0.6

## 2024-05-22 NOTE — Consult Note (Addendum)
 "  Cardiology Consultation   Patient ID: DIAMANTE RUBIN MRN: [id number]; DOB: [birth year]  Admit date: 05/19/2024 Date of Consult: 05/22/2024  PCP:  Wonda Worth SQUIBB, PA   [facility] Providers Cardiologist:  Alm Clay, MD        Patient Profile: Chase Combs is a 78 y.o. 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 Mennie Lamy.  History of Present Illness: Chase Combs has past medical history as stated above. He presented to [facility] ED with complaints of abdominal pain/distention, N/V, diarrhea, mild chest pain, and SOB for approximately 24 hours prior to presentation. Of note, he recently had a prolonged hospitalization from 11/28-12/18 for recurrent Ogilvie syndrome, severe malnutrition, and profound diarrhea and was discharged to [facility].   ED course: Vitals stable with mild tachycardia and SpO2 in mid 90s on room air. Pro-BNP 10,122. Hs troponin 49. CMP with hyponatremia (128), hypokalemia (134), hypochloremia (91), elevated BUN/Cr ratio, hypoalbuminemia (2.9). CBC with thrombocytosis (480 K/uL). CT concerning for distal LBO with transition point at proximal sigmoid colon, GI consulted. S/p IVF 500 cc of LR, Reglan , NG tube, and rectal tube.  Upon speaking with the patient today, he states that he is doing better than when he first presented to the ED. States that chest pain and SOB have resolved. Denies any palpitations or dizziness/lightheadedness. Endorses ongoing abdominal distention, although somewhat better now. NG tube was removed earlier today. States that he was discharged  from his last hospitalization to [facility] where he ambulates with a walker. Denies any mobility difficulties or DOE. Prior to this, he was living at home with his wife and down the street from his daughter. States that his heart rate at home is typically in the 70s-80s and blood pressure in the 110s/80s. Denies any history of bradycardia or syncope. Has a smart watch with which he is able to monitor his heart rate.    Past Medical History:  Diagnosis Date   Abdominal hernia    per pt   Arthritis    hands   Benign localized prostatic hyperplasia with lower urinary tract symptoms (LUTS)    CAD (coronary artery disease) 05/2004   cardiologist--- dr clay;  positive stress test , had outpt cardiac cath 05-27-2004 stenosis RI;  06-18-2004 cath w/ PCI and BMS x1 to pRI;   STEMI 02-16-2009  s/p cath w/ PCI thrombectomy for total occluded RCA , BMS x1 to pRCA;  nuclear stress test 03-15-2012 low risk no ischemia but sig inferior-inferolateral infarct , ef 51%   DM II (diabetes mellitus, type II), controlled (HCC)    Dyslipidemia    Essential hypertension    History of ST elevation myocardial infarction (STEMI) 02/16/2009   inferior STEMI   cath w/ pci w/ BMS to pRCA   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   urologist--- dr borden/ radiation oncology-- dr patrcia;  dx 12/ 2022,  Gleason 4+5, PSA 24.6, volume 94.6cc;  plan to start IMRT   OSA (obstructive sleep apnea)    per pt dx approx 2008, no cpap intolerant   PONV (postoperative nausea and vomiting)  S/p bare metal coronary artery stent    01/ 2006  x1 BMS to pRI (CoStar study stent);  and 09/ 2010  x1 BMS to pRCA   Status post total knee replacement, 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    Past Surgical History:  Procedure Laterality Date   BOWEL DECOMPRESSION N/A 02/18/2024   Procedure: DECOMPRESSION, INTESTINE;  Surgeon: Albertus Gordy HERO, MD;  Location: [facility] ENDOSCOPY;  Service: Gastroenterology;  Laterality: N/A;   CARDIAC CATHETERIZATION  05/27/2004    outpatient by dr burnard [facility] cardiology) for positive stress test;  80% stensis pRI   CARDIOVERSION N/A 02/21/2024   Procedure: CARDIOVERSION;  Surgeon: Cherrie Toribio SAUNDERS, MD;  Location: [facility] INVASIVE CV LAB;  Service: Cardiovascular;  Laterality: N/A;   COLONOSCOPY N/A 02/18/2024   Procedure: COLONOSCOPY;  Surgeon: Albertus Gordy HERO, MD;  Location[facility]COPY;  Service: Gastroenterology;  Laterality: N/A;   CORONARY ANGIOPLASTY WITH STENT PLACEMENT  06/18/2004   @[facility]  by Dr Lavon;  PCI w/ BMS to pRI  (CoStar study stent - 2.5 x 16)   CORONARY ANGIOPLASTY WITH STENT PLACEMENT  02/16/2009   @[facility]  by dr verlin;   Inferior STEMI:  PCI w/ thrombectomy total occluded RCA, BMS x1 to pRCA (Vision BMS 3.5 x 23 -> 4.0 mm), residual 40-50% pLAD, ef 45-50%   CORONARY PRESSURE/FFR STUDY N/A 01/17/2024   Procedure: CORONARY PRESSURE/FFR STUDY;  Surgeon: Mady Bruckner, MD;  Location: [facility] INVASIVE CV LAB;  Service: Cardiovascular;  Laterality: N/A;   CORONARY STENT INTERVENTION N/A 01/17/2024   Procedure: CORONARY STENT INTERVENTION;  Surgeon: Mady Bruckner, MD;  Location: [facility] INVASIVE CV LAB;  Service: Cardiovascular;  Laterality: N/A;   CORONARY ULTRASOUND/IVUS N/A 01/17/2024   Procedure: Coronary Ultrasound/IVUS;  Surgeon: Mady Bruckner, MD;  Location: [facility] INVASIVE CV LAB;  Service: Cardiovascular;  Laterality: N/A;   CORONARY/GRAFT ACUTE MI REVASCULARIZATION N/A 02/01/2024   Procedure: Coronary/Graft Acute MI Revascularization;  Surgeon: Elmira Newman PARAS, MD;  Location: [facility] INVASIVE CV LAB;  Service: Cardiovascular;  Laterality: N/A;   ESOPHAGOGASTRODUODENOSCOPY N/A 02/02/2024   Procedure: EGD (ESOPHAGOGASTRODUODENOSCOPY);  Surgeon: Nandigam, Kavitha V, MD;  Location: [facility] ENDOSCOPY;  Service: Gastroenterology;  Laterality: N/A;   FLEXIBLE SIGMOIDOSCOPY N/A 10/20/2023   Procedure: KINGSTON SIDE;  Surgeon: Legrand Victory LITTIE DOUGLAS, MD;  Location: [facility] ENDOSCOPY;  Service: Gastroenterology;   Laterality: N/A;   FLEXIBLE SIGMOIDOSCOPY N/A 02/02/2024   Procedure: SIGMOIDOSCOPY, FLEXIBLE;  Surgeon: Shila Gustav GAILS, MD;  Location: [facility] ENDOSCOPY;  Service: Gastroenterology;  Laterality: N/A;   GOLD SEED IMPLANT N/A 09/19/2021   Procedure: GOLD SEED IMPLANT;  Surgeon: Cam Morene ORN, MD;  [facility];  Service: Urology;  Laterality: N/A;  ONLY NEEDS 30 MIN FOR ALL   INGUINAL HERNIA REPAIR Right 09/02/1999   @[facility] ;   recurrent repair right inguinal hernia;   previous repair approx 1990s   INGUINAL HERNIA REPAIR Left    1990s   KNEE ARTHROSCOPY W/ MENISCAL REPAIR Right 12/18/2005   @[facility]    LUMBAR DISC SURGERY  1982   L5--S1   RIGHT/LEFT HEART CATH AND CORONARY ANGIOGRAPHY N/A 01/17/2024   Procedure: RIGHT/LEFT HEART CATH AND CORONARY ANGIOGRAPHY;  Surgeon: Mady Bruckner, MD;  Location: [facility] INVASIVE CV LAB;  Service: Cardiovascular;  Laterality: N/A;   SPACE OAR INSTILLATION N/A 09/19/2021   Procedure: SPACE OAR INSTILLATION;  Surgeon: Cam Morene ORN, MD;  Location: [facility];  Service: Urology;  Laterality: N/A;   TOTAL KNEE ARTHROPLASTY Left 10/01/2023   Procedure: ARTHROPLASTY, KNEE, TOTAL;  Surgeon: Kay,  Marcey, MD;  Location: [facility] ORS;  Service: Orthopedics;  Laterality: Left;   TRANSESOPHAGEAL ECHOCARDIOGRAM (CATH LAB) N/A 02/21/2024   Procedure: TRANSESOPHAGEAL ECHOCARDIOGRAM;  Surgeon: Cherrie Toribio SAUNDERS, MD;  Location: [facility] INVASIVE CV LAB;  Service: Cardiovascular;  Laterality: N/A;       Scheduled Meds:  amiodarone   200 mg Oral Daily   clopidogrel   75 mg Oral Daily   methylnaltrexone   8 mg Subcutaneous Q24H   pantoprazole  (PROTONIX ) IV  40 mg Intravenous Q24H   polyethylene glycol  17 g Oral BID   revefenacin   175 mcg Nebulization Daily   sodium chloride  flush  3 mL Intravenous Q12H   tamsulosin   0.4 mg Oral QHS   Continuous Infusions:  heparin  1,450 Units/hr (05/22/24 1155)   PRN Meds: fentaNYL  (SUBLIMAZE ) injection,  ipratropium-albuterol , liver oil-zinc  oxide, prochlorperazine   Allergies:   Allergies[1]  Social History:   Social History   Socioeconomic History   Marital status: Married    Spouse name: [name]   Number of children: 3   Years of education: Not on file   Highest education level: Not on file  Occupational History   Not on file  Tobacco Use   Smoking status: Never   Smokeless tobacco: Never  Vaping Use   Vaping status: Never Used  Substance and Sexual Activity   Alcohol use: No    Alcohol/week: 0.0 standard drinks of alcohol   Drug use: Never   Sexual activity: Not on file  Other Topics Concern   Not on file  Social History Narrative   He is married, father of 3, grandfather of 5. Never smoked. Does not drink.    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       Social Drivers of Health   Tobacco Use: Low Risk (05/22/2024)   Patient History    Smoking Tobacco Use: Never    Smokeless Tobacco Use: Never    Passive Exposure: Not on file  Financial Resource Strain: Not on file  Food Insecurity: No Food Insecurity (05/20/2024)   Epic    Worried About Programme Researcher, Broadcasting/film/video in the Last Year: Never true    Ran Out of Food in the Last Year: Never true  Transportation Needs: Unknown (05/20/2024)   Epic    Lack of Transportation (Medical): No    Lack of Transportation (Non-Medical): Not on file  Physical Activity: Not on file  Stress: Not on file  Social Connections: Moderately Integrated (05/20/2024)   Social Connection and Isolation Panel    Frequency of Communication with Friends and Family: Three times a week    Frequency of Social Gatherings with Friends and Family: Three times a week    Attends Religious Services: 1 to 4 times per year    Active Member of Clubs or Organizations: No    Attends Banker Meetings: Never    Marital Status: Married  Catering Manager Violence: Not At Risk (05/20/2024)   Epic    Fear of Current or Ex-Partner: No    Emotionally Abused: No     Physically Abused: No    Sexually Abused: No  Depression (PHQ2-9): Not on file  Alcohol Screen: Not on file  Housing: Low Risk (05/20/2024)   Epic    Unable to Pay for Housing in the Last Year: No    Number of Times Moved in the Last Year: 0    Homeless in the Last Year: No  Utilities: Not At Risk (05/20/2024)   Epic    Threatened  with loss of utilities: No  Health Literacy: Not on file    Family History:    Family History  Problem Relation Age of Onset   Lung cancer Mother    Colon cancer Neg Hx    Esophageal cancer Neg Hx      ROS:  Please see the history of present illness.   All other ROS reviewed and negative.     Physical Exam/Data: Vitals:   05/21/24 2047 05/22/24 0530 05/22/24 0615 05/22/24 0800  BP:   109/72 119/68  Pulse:   90 80  Resp:   18   Temp:   (!) 97.5 F (36.4 C) 97.6 F (36.4 C)  TempSrc:   Oral Oral  SpO2: 92%  93% 96%  Weight:  75 kg    Height:        Intake/Output Summary (Last 24 hours) at 05/22/2024 1752 Last data filed at 05/22/2024 0300 Gross per 24 hour  Intake 1876.78 ml  Output 450 ml  Net 1426.78 ml      05/22/2024    5:30 AM 05/21/2024    5:00 AM 05/20/2024    4:44 AM  Last 3 Weights  Weight (lbs) 165 lb 5.5 oz 149 lb 14.6 oz 166 lb 10.7 oz  Weight (kg) 75 kg 68 kg 75.6 kg     Body mass index is 22.42 kg/m.  General: Resting in bed, in no acute distress. Occasionally shifts position in bed due to abdominal distention/discomfort Neck: No JVD Vascular: Distal pulses 2+ bilaterally Cardiac: Normal S1, S2; RRR Lungs: Clear to auscultation bilaterally, no wheezing, rhonchi or rales  Abd: Soft with moderate distention, rectal tube in place Ext: No peripheral edema Musculoskeletal: No deformities noted Skin: Warm and dry Neuro: No focal abnormalities noted Psych: Normal affect   EKG:  The ED EKG was personally reviewed and demonstrates: Sinus rhythm at 96 bpm, RBBB, LAFB Telemetry:  Telemetry was personally  reviewed and demonstrates: Sinus rhythm, rate in the 80s, low voltage QRS  Relevant CV Studies:  TEE (02/21/24): Left ventricular ejection fraction, by estimation, is 20 to 25% . The left ventricle has severely decreased function. The left ventricle demonstrates global hypokinesis. Right ventricular systolic function is moderately reduced. The right ventricular size is normal. Left atrial size was moderately dilated. No left atrial/ left atrial appendage thrombus was detected.  The mitral valve is normal in structure. Mild to moderate mitral valve regurgitation.  The aortic valve is tricuspid. There is mild calcification of the aortic valve. Aortic valve regurgitation is mild.  CTA chest/abd/pel (05/19/24): Coronary and aortic atherosclerotic calcifications are noted. The heart is moderately enlarged. Pericardium is unremarkable.    Laboratory Data: High Sensitivity Troponin:   Recent Labs  Lab 04/25/24 2339 04/26/24 0219  TROPONINIHS 39* 38*    Recent Labs  Lab 05/19/24 1632  TRNPT 49*      Chemistry Recent Labs  Lab 05/19/24 1326 05/19/24 1550 05/20/24 0739 05/21/24 0826 05/21/24 1931 05/22/24 0723  NA  --    < > 129* 134*  --  138  K  --    < > 3.0* 3.0* 4.4 4.2  CL  --    < > 93* 105  --  109  CO2  --    < > 21* 17*  --  21*  GLUCOSE  --    < > 79 48*  --  81  BUN  --    < > 74* 44*  --  23  CREATININE  --    < >  1.82* 0.95  --  0.62  CALCIUM   --    < > 7.9* 7.7*  --  7.8*  MG 1.8  --  2.1  --   --   --   GFRNONAA  --    < > 38* >60  --  >60  ANIONGAP  --    < > 15 12  --  8   < > = values in this interval not displayed.    Recent Labs  Lab 05/19/24 1632  PROT 5.6*  ALBUMIN 2.9*  AST 31  ALT 57*  ALKPHOS 106  BILITOT 0.4   Lipids No results for input(s): CHOL, TRIG, HDL, LABVLDL, LDLCALC, CHOLHDL in the last 168 hours.  Hematology Recent Labs  Lab 05/20/24 0739 05/21/24 0826 05/22/24 0723  WBC 4.3 4.3 5.7  RBC 4.05* 3.77* 4.22  HGB  12.3* 11.3* 12.6*  HCT 36.2* 34.7* 38.7*  MCV 89.4 92.0 91.7  MCH 30.4 30.0 29.9  MCHC 34.0 32.6 32.6  RDW 17.4* 17.3* 17.4*  PLT 377 305 350   Thyroid  No results for input(s): TSH, FREET4 in the last 168 hours.  BNP Recent Labs  Lab 05/19/24 1326  PROBNP 10,122.0*    DDimer No results for input(s): DDIMER in the last 168 hours.  Radiology/Studies:  DG Abd 1 View Result Date: 05/21/2024 CLINICAL DATA:  Ileus. EXAM: DG ABDOMEN 1V COMPARISON:  Radiograph yesterday FINDINGS: Tip and side port of the enteric tube below the diaphragm in the stomach. Stable diffuse gaseous distention of bowel loops, primarily colon. Few prominent air-filled loops of small bowel are also seen, mild improvement in small bowel distension. No obvious free air or pneumatosis. IMPRESSION: Stable gaseous colonic distension consistent with colonic ileus, although mild improvement in small bowel dilatation after NG tube placement. Electronically Signed   By: Andrea Gasman M.D.   On: 05/21/2024 16:52   DG Abd 1 View Result Date: 05/20/2024 CLINICAL DATA:  Nasogastric tube placement EXAM: ABDOMEN - 1 VIEW COMPARISON:  CT yesterday FINDINGS: Tip and side port of the enteric tube below the diaphragm in the stomach. Gaseous colonic distention is again seen. IMPRESSION: Tip and side port of the enteric tube below the diaphragm in the stomach. Electronically Signed   By: Andrea Gasman M.D.   On: 05/20/2024 12:19   CT CHEST ABDOMEN PELVIS WO CONTRAST Result Date: 05/19/2024 EXAM: CT CHEST, ABDOMEN AND PELVIS WITHOUT CONTRAST 05/19/2024 05:04:14 PM TECHNIQUE: CT of the chest, abdomen and pelvis was performed without the administration of intravenous contrast. Multiplanar reformatted images are provided for review. Automated exposure control, iterative reconstruction, and/or weight based adjustment of the mA/kV was utilized to reduce the radiation dose to as low as reasonably achievable. COMPARISON: CT abdomen and  pelvis 04/21/2024. CLINICAL HISTORY: Abdominal distention, Hx of nonobstructive ileus with repeated symptoms of n/v/d. FINDINGS: CHEST: MEDIASTINUM AND LYMPH NODES: Coronary and aortic atherosclerotic calcifications are noted. The heart is moderately enlarged. Pericardium is unremarkable. There are multiple bilateral thyroid  nodules measuring up to 15 mm. The central airways are clear. No mediastinal, hilar or axillary lymphadenopathy. LUNGS AND PLEURA: There are bands of atelectasis in the right lower lobe, right middle lobe and inferior right upper lobe. No pulmonary edema. No pleural effusion or pneumothorax. ABDOMEN AND PELVIS: LIVER: The liver is unremarkable. GALLBLADDER AND BILE DUCTS: Gallbladder is unremarkable. No biliary ductal dilatation. SPLEEN: No acute abnormality. PANCREAS: No acute abnormality. ADRENAL GLANDS: Right adrenal nodule, likely adenoma, appears unchanged. Left adrenal gland within normal limits.  KIDNEYS, URETERS AND BLADDER: Multiple bilateral renal cysts are again seen measuring up to 3.8 cm on the right. There are additional mildly hyperdense areas in the kidneys for example left kidney image 3/81 and right kidney image 3/64 measuring 2.8 cm, which are similar to the prior study. There is a single punctate calculus in the right kidney. No stones in the ureters. There is no hydronephrosis. No perinephric or periureteral stranding. Nodular protrusion of the prostate gland into the bladder base which is unchanged. Prostate radiotherapy seeds are present. GI AND BOWEL: There is moderate air fluid level in the stomach. Ascending colon measures 10 cm. Descending colon measures 6 cm. Air and fluid levels are seen throughout the colon. Transition is seen at the level of the proximal sigmoid colon. There is sigmoid colon diverticulosis. No focal inflammation or wall thickening identified in the colon. The appendix appears shortened and within normal limits. Additionally there is dilatation of  ileal loops with air fluid levels to the level of the terminal ileum measuring up to 4.5 cm. Jejunal loops appear normal in size proximally. REPRODUCTIVE ORGANS: The prostate gland is enlarged. Prostate radiotherapy seeds are present. Nodular protrusion of the prostate gland into the bladder base which is unchanged. PERITONEUM AND RETROPERITONEUM: No ascites. No free air. No pneumatosis. There is a small fat containing umbilical hernia which is unchanged. VASCULATURE: Aorta is normal in caliber. There are atherosclerotic calcifications of the aorta. ABDOMINAL AND PELVIS LYMPH NODES: No lymphadenopathy. BONES AND SOFT TISSUES: Post degenerative changes of the right shoulder. There is bilateral gynecomastia. There is mild compression deformity of T11 which is unchanged. No acute osseous abnormality. No focal soft tissue abnormality. IMPRESSION: 1. Distal large bowel obstruction with transition at the proximal sigmoid colon and associated small bowel dilatation. 2. Sigmoid colon diverticulosis without diverticulitis. 3. Multiple thyroid  nodules measuring up to 1.5 cm, for which non-emergent thyroid  ultrasound is recommended. 4. Indeterminate bilateral hyperdense renal lesions measuring up to 2.8 cm, for which renal protocol MRI (preferred) or CT without and with contrast is recommended. 5. Punctate right renal calculus. Electronically signed by: Greig Pique MD 05/19/2024 06:34 PM EST RP Workstation: [id number]   DG Chest 2 View Result Date: 05/19/2024 CLINICAL DATA:  Shortness of breath, chest tightness EXAM: CHEST - 2 VIEW COMPARISON:  April 30, 2024 FINDINGS: Stable cardiomediastinal silhouette. Hypoinflation of the lungs. Stable right perihilar subsegmental atelectasis or scarring. Left lung is clear. IMPRESSION: Hypoinflation of the lungs with right perihilar subsegmental atelectasis or scarring. Electronically Signed   By: Lynwood Landy Raddle M.D.   On: 05/19/2024 14:27    Assessment and Plan: Recurrent  pseudo-obstruction of colon/Ogilvie syndrome Recent prolonged hospitalization for same (11/28-12/18) S/p NG tube (now removed), on Miralax  and Relistor  9 mg [place] daily Multiple previous treatments include Miralax , enema, Relistor , neostigmine , and colonic decompression (01/2024) Given recurrent/resistant nature of ileus/pseudo-obstruction, can try pyridostigmine in efforts to improve bowel motility BP soft but stable, HR stable in the 80s Telemetry showed sinus rhythm, rate in the 80s, low voltage QRS Given concern for bradycardia and conduction abnormalities, recommend starting at low dose 0.25 mg q6 hours to minimize risk of side effects Continue to monitor vitals, continuous telemetry  Persistent atrial fibrillation/flutter Home meds: amiodarone  200 mg daily, digoxin  0.0625 mg daily, Eliquis  5 mg BID Telemetry shows sinus rhythm, rate in the 80s, low voltage QRS PO meds have been held due to pt inability to tolerate Continue heparin  while Eliquis  is held, can resume Eliquis  if patient able  to tolerate PO meds Discontinue digoxin  given conduction abnormalities on EKG and planned pyridostigmine use  Chronic biventricular HFrEF Ischemic cardiomyopathy Recent prolonged hospitalization c/b cardiogenic shock with acute HR with LAD stent stenosis (8/30-10/3) Echo (01/2024): LVEF 20-25% with global HK, moderately reduced RV systolic function, moderate LAE, mild-moderate MR, mild AR Home meds: Entresto  24-26 mg BID, spironolactone  12.5 mg daily, Jardiance  10 mg daily, digoxin  0.0625 mg daily, Lasix  40 mg PO BID with 20 mEq K BID Kidney function improved, creatinine 2.40 on admission >> now 0.62 Euvolemic on exam Net I/O +1,263.6 mL since admission [05/22/24 1748] Continue strict I/Os, daily weights, daily BMPs  History of NSTEMI s/p PCI with DES to LAD (01/17/24) History of remote STEMI s/p PCI with BMS to pRCA (2010) CAD s/p remote PCI with BMS to pRI (2006) Hyperlipidemia  DAPT c/b history of  GI bleed and stent thrombosis On Plavix  75 mg daily following genetic 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  Hypertension  Antihypertensive medications on hold due to soft BP  Per primary Diabetes mellitus type 2 OSA Prostate cancer PUD   Risk Assessment/Risk Scores:       New [place]  Heart Association (NYHA) Functional Class NYHA Class II  CHA2DS2-VASc Score = 5   This indicates a 7.2% annual risk of stroke. The patient's score is based upon: CHF History: 1 HTN History: 1 Diabetes History: 1 Stroke History: 0 Vascular Disease History: 0 Age Score: 2 Gender Score: 0     For questions or updates, please contact [facility] Please consult [email or web address] for contact info under    Signed, Owen MARLA Daniels, PA-C  05/22/2024 5:52 PM   History and all data above reviewed.  I personally took the history today, performed the physical exam and formulated the assessment and plan.  I reviewed all relevant tests and studies.  This nice patient has had a tough year.  He is now in with ileus as above and the question we are asked is can we use pyridostigmine.  He has a complicated past and recent cardiac history with recent cath x 2 with stenting of an LAD and then treatment of an acute occlusion of his LAD soon after.  He had cardiogenic shock.  He has most recently been in the hospital a couple of times with his bowel issues and has had some physical therapy at a nursing home.  He says that he was walking with a walker in the hallway a little bit after his last discharge.  He is weak.  He does not however, have acute cardiac complaints. In particular he denies any new SOB/PND.  He has no chest pain. He is not having any presyncope or syncope.  Patient examined.  I agree with the findings as above.  The patient exam reveals COR:RRR,   ,  Lungs: Clear  ,  Abd: Positive bowel sounds hyperactive, no rebound no guarding, Ext   Mild lower extremity edema  .  All available labs, radiology testing,  previous records reviewed. Agree with documented assessment and plan.   Med management:  He does have significant conduction abnormalities on his EKG (RBBB/LAFB) but no symptomatic bradyarrhythmias.  I think we could use pyridostigmine and follow for symptomatic bradycardia.  I might even suggest a home monitor for four weeks if he is to use this drug chronically.  I will stop the digoxin .    Chronic HF with reduced EF: He seems to be euvolemic .  GDMT has been held secondary to AKI and  hypotension.  Starting tomorrow I will likely recommend slowly restarting with Entresto  first and then over time titrating back to therapy based on BP and creat.  CAD:  Continued Plavix  uninterrupted.  No active symptoms.  Atrial arrhythmias:  Resume DOAC when OK with primary team.  Off when he was NPO.    Lynwood Handy Mcloud  6:01 PM  05/22/2024    [1]  Allergies Allergen Reactions   Codeine Nausea And Vomiting   Latex Rash    Oral blisters when used at dentist   "

## 2024-05-22 NOTE — Care Management Important Message (Signed)
 Important Message  Patient Details  Name: Chase Combs MRN: [id number] Date of Birth: [birth year]   Important Message Given:  Yes - Medicare IM     [name] 05/22/2024, 2:06 PM

## 2024-05-22 NOTE — Progress Notes (Signed)
 NG tube removed with no complaints of pain or discomfort. Tolerated well.

## 2024-05-22 NOTE — Evaluation (Signed)
 Physical Therapy Evaluation Patient Details Name: Chase Combs MRN: [id number] DOB: [birth year] Today's Date: 05/22/2024  History of Present Illness  Pt is a 78 y.o male admitted from [facility] for N/V/D. 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 PMH: NSTEMI, CAD, heart failure, a fib, HTN, HLD, DMII, CKD, OSA, prostate cancer, Ogilvie syndrome  Clinical Impression  Pt admitted with above diagnosis. Lives at home with his wife (who cannot provide much physical assist), in a single-level home with a ramped entrance; Prior to admission, pt was at [facility], working on walking and ADLs; He very much wants to dc home; Presents to PT with generalized weakness, decr activity tolerance, functional dependencies;  Today, pt was able to walk in his room with RW, stood with min physical assist; Note worthy decr functional endurance with DOE, 3/4; He has lots of DME, including a power scooter, and so endurance for progressive amb isn't absolutely critical for him to get home; Still, if slow progress we may need to reconsider SNF for rehab; Pt currently with functional limitations due to the deficits listed below (see PT Problem List). Pt will benefit from skilled PT to increase their independence and safety with mobility to allow discharge to the venue listed below.           If plan is discharge home, recommend the following: A lot of help with walking and/or transfers;A lot of help with bathing/dressing/bathroom;Assistance with cooking/housework;Assist for transportation;Help with stairs or ramp for entrance   Can travel by private vehicle        Equipment Recommendations None recommended by PT (well-equipped)  Recommendations for Other Services    Mobility Team   Functional Status Assessment Patient has had a recent decline in their functional status and demonstrates the ability to make significant improvements in function in a reasonable and predictable amount of time.      Precautions / Restrictions Precautions Precautions: Fall;Other (comment) Recall of Precautions/Restrictions: Intact Precaution/Restrictions Comments: watch BP,  rectal pouch Restrictions Weight Bearing Restrictions Per Provider Order: No      Mobility  Bed Mobility Overal bed mobility: Needs Assistance Bed Mobility: Supine to Sit, Sit to Supine     Supine to sit: HOB elevated, Supervision Sit to supine: HOB elevated, Supervision   General bed mobility comments: S for safety with line management    Transfers Overall transfer level: Needs assistance Equipment used: Rolling walker (2 wheels) Transfers: Sit to/from Stand Sit to Stand: Min assist           General transfer comment: Min assist to rise and steady    Ambulation/Gait Ambulation/Gait assistance: Contact guard assist, +2 safety/equipment, Min assist Gait Distance (Feet): 18 Feet Assistive device: Rolling walker (2 wheels) Gait Pattern/deviations: Step-through pattern Gait velocity: dec     General Gait Details: pt ambulating with slow shuffling steps, wide BOS noted, no overt LOB. Noting decr funcitonal endurance, fatigue and DOE limiting distance  Stairs            Wheelchair Mobility     Tilt Bed    Modified Rankin (Stroke Patients Only)       Balance Overall balance assessment: Needs assistance Sitting-balance support: Feet supported Sitting balance-Leahy Scale: Good       Standing balance-Leahy Scale: Poor                               Pertinent Vitals/Pain Pain Assessment Pain Assessment: Faces Faces Pain  Scale: Hurts a little bit Pain Location: generalized with mobility Pain Descriptors / Indicators: Grimacing Pain Intervention(s): Monitored during session    Home Living Family/patient expects to be discharged to:: Private residence Living Arrangements: Spouse/significant other Available Help at Discharge: Family;Available 24 hours/day Type of Home:  House Home Access: Ramped entrance       Home Layout: One level Home Equipment: Agricultural Consultant (2 wheels);Rollator (4 wheels);Cane - single point;Lift chair;Grab bars - toilet;Tub bench;Wheelchair - Pharmacist, Hospital Comments: States wife with PD, uses wc soem of the time; able to care for herself    Prior Function Prior Level of Function : Needs assist             Mobility Comments: Was at [facility] completing rehab. Reports short distances with RW, use of w/c for distances ADLs Comments: Was at [facility] working with rehab reports assistance for LB ADLs     Extremity/Trunk Assessment   Upper Extremity Assessment Upper Extremity Assessment: Defer to OT evaluation    Lower Extremity Assessment Lower Extremity Assessment: Generalized weakness    Cervical / Trunk Assessment Cervical / Trunk Assessment: Other exceptions Cervical / Trunk Exceptions: Abdominal distention  Communication   Communication Communication: No apparent difficulties    Cognition Arousal: Alert Behavior During Therapy: Flat affect                             Following commands: Intact       Cueing Cueing Techniques: Verbal cues     General Comments General comments (skin integrity, edema, etc.): Motivated to work to return home    Exercises     Assessment/Plan    PT Assessment Patient needs continued PT services  PT Problem List Decreased strength;Decreased range of motion;Decreased activity tolerance;Decreased balance;Decreased mobility;Decreased knowledge of use of DME;Decreased knowledge of precautions;Cardiopulmonary status limiting activity       PT Treatment Interventions DME instruction;Therapeutic exercise;Gait training;Balance training;Functional mobility training;Therapeutic activities;Patient/family education    PT Goals (Current goals can be found in the Care Plan section)  Acute Rehab PT Goals Patient Stated Goal: get stronger and return home PT  Goal Formulation: With patient Time For Goal Achievement: 06/05/24 Potential to Achieve Goals: Good    Frequency Min 2X/week     Co-evaluation               AM-PAC PT 6 Clicks Mobility  Outcome Measure Help needed turning from your back to your side while in a flat bed without using bedrails?: A Little Help needed moving from lying on your back to sitting on the side of a flat bed without using bedrails?: A Little Help needed moving to and from a bed to a chair (including a wheelchair)?: A Little Help needed standing up from a chair using your arms (e.g., wheelchair or bedside chair)?: A Little Help needed to walk in hospital room?: A Little Help needed climbing 3-5 steps with a railing? : A Lot 6 Click Score: 17    End of Session Equipment Utilized During Treatment: Gait belt Activity Tolerance: Patient limited by fatigue Patient left: with call bell/phone within reach;in bed;with bed alarm set Nurse Communication: Mobility status PT Visit Diagnosis: Other abnormalities of gait and mobility (R26.89);Muscle weakness (generalized) (M62.81)    Time: 8769-8745 PT Time Calculation (min) (ACUTE ONLY): 24 min   Charges:   PT Evaluation $PT Eval Moderate Complexity: 1 Mod PT Treatments $Gait Training: 8-22 mins PT General Charges $$ ACUTE  PT VISIT: 1 Visit         Silvano Currier, PT  Acute Rehabilitation Services Office [phone] Secure Chat welcomed   Silvano VEAR Currier 05/22/2024, 1:09 PM

## 2024-05-22 NOTE — TOC Initial Note (Signed)
 Transition of Care (TOC) - Initial/Assessment Note   Spoke to patient and wife [name] at bedside.   PT/OT recommendation home health PT/OT , patient already has DME ( walker, Rollator, bath seat, raised commode seat )   Patient and wife in agreement. They have had [facility] in the past and would like them again. They are requesting [name].   [name] with [facility] accepted referral. NCM requested [name]  Patient Details  Name: Chase Combs MRN: [id number] Date of Birth: [birth year]  Transition of Care [facility]) CM/SW Contact:    Stephane Powell Jansky, RN Phone Number: 05/22/2024, 2:41 PM  Clinical Narrative:                   Expected Discharge Plan: Home w Home Health Services Barriers to Discharge: Continued Medical Work up   Patient Goals and CMS Choice Patient states their goals for this hospitalization and ongoing recovery are:: to return to home CMS Medicare.gov Compare Post Acute Care list provided to:: Patient Choice offered to / list presented to : Patient, Spouse      Expected Discharge Plan and Services   Discharge Planning Services: CM Consult Post Acute Care Choice: Home Health Living arrangements for the past 2 months: Single Family Home                 DME Arranged: N/A DME Agency: NA       HH Arranged: PT, OT HH Agency: [facility] Date [facility] Agency Contacted: 05/22/24 Time HH Agency Contacted: 1440 Representative spoke with at [facility] Agency: [name]  Prior Living Arrangements/Services Living arrangements for the past 2 months: Single Family Home Lives with:: Spouse Patient language and need for interpreter reviewed:: Yes Do you feel safe going back to the place where you live?: Yes      Need for Family Participation in Patient Care: Yes (Comment) Care giver support system in place?: Yes (comment) Current home services: DME Criminal Activity/Legal Involvement Pertinent to Current Situation/Hospitalization: No - Comment as needed  Activities of Daily Living    ADL Screening (condition at time of admission) Independently performs ADLs?: No Does the patient have a NEW difficulty with bathing/dressing/toileting/self-feeding that is expected to last >3 days?: Yes (Initiates electronic notice to provider for possible OT consult) Does the patient have a NEW difficulty with getting in/out of bed, walking, or climbing stairs that is expected to last >3 days?: Yes (Initiates electronic notice to provider for possible PT consult) Does the patient have a NEW difficulty with communication that is expected to last >3 days?: No Is the patient deaf or have difficulty hearing?: Yes Does the patient have difficulty seeing, even when wearing glasses/contacts?: No Does the patient have difficulty concentrating, remembering, or making decisions?: No  Permission Sought/Granted   Permission granted to share information with : Yes, Verbal Permission Granted  Share Information with NAME: wife [name]  Permission granted to share info w AGENCY: [facility]        Emotional Assessment Appearance:: Appears stated age Attitude/Demeanor/Rapport: Engaged Affect (typically observed): Appropriate Orientation: : Oriented to Self, Oriented to Place, Oriented to  Time, Oriented to Situation Alcohol / Substance Use: Not Applicable Psych Involvement: No (comment)  Admission diagnosis:  Shortness of breath [R06.02] Abdominal distention [R14.0] Hyponatremia [E87.1] Large bowel obstruction (HCC) [K56.609] Nausea vomiting and diarrhea [R11.2, R19.7] Chest pain, unspecified type [R07.9] Patient Active Problem List   Diagnosis Date Noted   Abdominal distention 05/22/2024   Colonic pseudoobstruction 05/19/2024   Thyroid   nodule 05/19/2024   Renal lesion 05/19/2024   Malnutrition of moderate degree 05/09/2024   Adynamic ileus (HCC) 04/28/2024   Chronic combined systolic and diastolic congestive heart failure (HCC) 04/28/2024   Elevated troponin 04/22/2024   Fall at home,  initial encounter 04/22/2024   Hypotension 04/22/2024   Paroxysmal atrial flutter (HCC) 04/22/2024   Anemia of chronic disease 04/22/2024   Enterocolitis 04/21/2024   DNR (do not resuscitate) 02/19/2024   Generalized abdominal pain 02/18/2024   Abnormal x-ray of abdomen 02/18/2024   Acute kidney injury 02/11/2024   DM2 (diabetes mellitus, type 2) (HCC) 02/11/2024   PUD (peptic ulcer disease) 02/03/2024   Hyperglycemia 02/03/2024   Pressure injury of skin 02/02/2024   Atrial fibrillation with RVR (HCC) 02/01/2024   Anemia due to acute blood loss 02/01/2024   Persistent atrial fibrillation (HCC) 01/31/2024   Ileus, unspecified (HCC) 01/31/2024   Small intestinal bacterial overgrowth (SIBO) 01/29/2024   Protein-calorie malnutrition, severe 01/28/2024   Ogilvie syndrome 01/28/2024   Chronic systolic CHF (congestive heart failure) (HCC) 01/17/2024   Ischemic cardiomyopathy 01/14/2024   Acute hypoxemic respiratory failure (HCC) 01/08/2024   Hypokalemia 10/16/2023   DOE (dyspnea on exertion) 06/21/2023   Postural dizziness with presyncope 06/13/2022   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(HCC) 06/17/2021   Hyperglycemia, unspecified 02/15/2020   Fatigue due to treatment 09/24/2017   Polypharmacy 06/10/2014   HLD (hyperlipidemia) 03/03/2013   Insomnia 03/03/2013   Essential hypertension    Coronary artery disease involving native coronary artery of native heart without angina pectoris 06/18/2004   PCP:  Wonda Worth SQUIBB, PA Pharmacy:   [facility] [phone] [address], [place] - [address] AT [facility] [address] [place] [address] Phone: [phone] Fax: [phone]  [facility] - [place], [place] - [address] [place] [address] Phone: [phone] Fax: [phone]     Social Drivers of Health (SDOH) Social History: SDOH Screenings   Food Insecurity: No Food Insecurity (05/20/2024)  Housing: Low Risk  (05/20/2024)  Transportation Needs: Unknown (05/20/2024)  Utilities: Not At Risk (05/20/2024)  Social Connections: Moderately Integrated (05/20/2024)  Tobacco Use: Low Risk (05/19/2024)   SDOH Interventions:     Readmission Risk Interventions    01/26/2024   12:46 PM 10/21/2023   11:36 AM  Readmission Risk Prevention Plan  Transportation Screening Complete Complete  PCP or Specialist Appt within 5-7 Days  Complete  Home Care Screening  Complete  Medication Review (RN CM)  Complete  Medication Review (RN Care Manager) Referral to Pharmacy   PCP or Specialist appointment within 3-5 days of discharge Complete   HRI or Home Care Consult Complete   SW Recovery Care/Counseling Consult Complete   Palliative Care Screening Not Applicable   Skilled Nursing Facility Not Applicable

## 2024-05-22 NOTE — Progress Notes (Signed)
 " [name]  FMW:[id number] DOB: Oct 10, 1945 DOA: 05/19/2024 PCP: Wonda Worth SQUIBB, PA  Brief Narrative/Hospital Course: Chase Combs is a 78 y.o. 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  He has not been able to keep oral intake and has chronic abdominal distention and nausea vomiting diarrhea. Recent prolonged admit 11/28-12/18- 2/2 recurrent Ogilvie's syndrome, severe malnutrition profound diarrhea> and discharged to SNF. In ED  afebrile and  spo2 mid 90s on  RA,  mild tachycardia and stable blood pressure. Labs-sodium 128, BUN 67, creatinine 1.96, normal WBC, troponin 49, and proBNP 10,122. CT is concerning for distal large bowel obstruction with transition point at the proximal sigmoid colon. GI was consulted by the ED, s/p ivf 500 cc LR and Reglan , and  NG tube and rectal tube ordered.  Subjective: Seen and examined earlier in the morning Overall he feels much better Passing some flatus, NG tube being clamped> was later removed Overnight events afebrile vital stable labs shows hypokalemia has resolved CBC stable  Clear liquid started yesterday evening  Assessment and plan:  Recurrent Pseudo-obstruction of colon/Ogilvie syndrome Recurrent abdominal pain distention nausea vomiting from above: S/P NG tube decompression repeat x-ray 12/28 with stable gaseous colonic distention,Abdomen is less distended NGT removed this morning. GI following -continue PPI twice daily, pain management diet ADAT. keep K above 4 mag above 2-and has been stable now OOB and activity as tolerated.  IV fluids discontinued  AKI on CKD stage II Hypovolemic hyponatremia Hypokalemia Metabolic acidosis Recent urinary  obstruction: Patient has electrolyte balance in the setting of nausea vomiting and obstruction, volume depletion. Volume resuscitated and lites replaced at this time stabilized. Avoid nephrotoxic medication.  Resume Flomax  once able Recent Labs    05/02/24 0408 05/03/24 0314 05/06/24 0610 05/09/24 0611 05/10/24 1206 05/11/24 0512 05/19/24 1550 05/19/24 1632 05/20/24 0739 05/21/24 0826 05/21/24 1931 05/22/24 0723  BUN 14 11 8  7* 9 7* 70* 67* 74* 44*  --  23  CREATININE 0.83 0.81 0.74 0.62 0.75 0.74 2.40* 1.96* 1.82* 0.95  --  0.62  CO2 19* 27 30 30 26 30   --  22 21* 17*  --  21*  K 2.9* 2.8* 2.4* 2.4* 3.2* 3.0* 3.2* 3.4* 3.0* 3.0* 4.4 4.2   Intake/Output Summary (Last 24 hours) at 05/22/2024 1110 Last data filed at 05/22/2024 0300 Gross per 24 hour  Intake 1876.78 ml  Output 450 ml  Net 1426.78 ml    CAD Ischemic cardiomyopathy: Mildly elevated troponin but no chest pain.Resume Plavix  when able to take p.o.  Chronic HFrEF Recent prolonged hospitalization complicated by cardiogenic shock with acute CHF with LAD stent stenosis 01/25/24: Last EF 20-25%, Global HKN on 02/21/24, proBNP at baseline 10,122-was 15,2454 in 06/26/24.   PTA on Jardiance  recently digoxin  Entresto  was held due to softer blood pressure.Net IO Since Admission: 1,263.57 mL [05/22/24 1110]   Atrial fibrillation on Eliquis : P.o. meds on hold, remains in sinus rhythm.  Continue heparin  while Eliquis  on hold  History of PUD: Continue PPI  T2DM: Last A1c normal 5.5 last month, blood sugar is stable  OSA: CPAP intolerance   Thyroid  nodules; renal lesions  Indeterminate thyroid  nodules and b/l renal lesions noted incidentally on CT in ED  Outpatient follow-up with thyroid   US  and renal protocol MRI recommended   Goals of care: Patient remains very high risk for readmission with complex medical comorbidities and at risk for acute decompensation, overall prognosis does not appear bright.He was seen by  palliative care on his last admission-he is DNR limited with full scope of intervention.  Confirmed DNR status, explained him his overall serious medical condition.  Deconditioning debility: Request PT OT evaluation and ambulation Mobility: PT Orders: Active PT Follow up Rec:     DVT prophylaxis: Heparin  Code Status:   Code Status: Limited: Do not attempt resuscitation (DNR) -DNR-LIMITED -Do Not Intubate/DNI  Family Communication: plan of care discussed with patient at bedside. Patient status is: Remains hospitalized because of severity of illness Level of care: Telemetry   Dispo: The patient is from: SNF [facility].  prior to SNF was living at  home w/ wife            Anticipated disposition: TBD Objective: Vitals last 24 hrs: Vitals:   05/21/24 2047 05/22/24 0530 05/22/24 0615 05/22/24 0800  BP:   109/72 119/68  Pulse:   90 80  Resp:   18   Temp:   (!) 97.5 F (36.4 C) 97.6 F (36.4 C)  TempSrc:   Oral Oral  SpO2: 92%  93% 96%  Weight:  75 kg    Height:        Physical Examination: General exam: AAOX3 patient, not in distress HEENT:Oral mucosa moist, Ear/Nose WNL grossly Respiratory system: cta Bilaterally Cardiovascular system: S1 & S2 +, No JVD. Gastrointestinal system: Abdomen mildly distended, soft, NG tube rectal tube + Nervous System: Alert, awake, moving all extremities,and following commands. Extremities: extremities warm, leg edema mild Skin: Warm, no rashes MSK: Normal muscle bulk,tone, power   Medications reviewed:  Scheduled Meds:  methylnaltrexone   8 mg Subcutaneous Q24H   pantoprazole  (PROTONIX ) IV  40 mg Intravenous Q24H   polyethylene glycol  17 g Oral BID   sodium chloride  flush  3 mL Intravenous Q12H   Continuous Infusions:  heparin  1,450 Units/hr (05/21/24 1844)   Diet: Diet Order             Diet clear liquid Fluid consistency: Thin  Diet effective now                   Data Reviewed: I have personally reviewed following labs and  imaging studies ( see epic result tab) CBC: Recent Labs  Lab 05/19/24 1326 05/19/24 1550 05/20/24 0739 05/21/24 0826 05/22/24 0723  WBC 5.6  --  4.3 4.3 5.7  NEUTROABS 4.0  --   --   --   --   HGB 13.8 14.3 12.3* 11.3* 12.6*  HCT 41.2 42.0 36.2* 34.7* 38.7*  MCV 91.4  --  89.4 92.0 91.7  PLT 480*  --  377 305 350   CMP: Recent Labs  Lab 05/19/24 1326 05/19/24 1550 05/19/24 1550 05/19/24 1632 05/20/24 0739 05/21/24 0826 05/21/24 1931 05/22/24 0723  NA  --  130*  --  128* 129* 134*  --  138  K  --  3.2*   < > 3.4* 3.0* 3.0* 4.4 4.2  CL  --  92*  --  91* 93* 105  --  109  CO2  --   --   --  22 21* 17*  --  21*  GLUCOSE  --  75  --  70 79 48*  --  81  BUN  --  70*  --  67* 74*  44*  --  23  CREATININE  --  2.40*  --  1.96* 1.82* 0.95  --  0.62  CALCIUM   --   --   --  8.2* 7.9* 7.7*  --  7.8*  MG 1.8  --   --   --  2.1  --   --   --    < > = values in this interval not displayed.   GFR: Estimated Creatinine Clearance: 83.3 mL/min (by C-G formula based on SCr of 0.62 mg/dL). Recent Labs  Lab 05/19/24 1632  AST 31  ALT 57*  ALKPHOS 106  BILITOT 0.4  PROT 5.6*  ALBUMIN 2.9*    Recent Labs  Lab 05/19/24 1632  LIPASE <10*   No results for input(s): AMMONIA in the last 168 hours. Coagulation Profile:  Recent Labs  Lab 05/19/24 1418  INR 2.0*   Unresulted Labs (From admission, onward)     Start     Ordered   05/22/24 0500  APTT  Daily,   R      05/20/24 1939   05/22/24 0500  Heparin  level (unfractionated)  Daily,   R      05/20/24 1939   05/20/24 0500  Basic metabolic panel  Daily,   R      05/19/24 2046   05/20/24 0500  CBC  Daily,   R      05/19/24 2046           Antimicrobials/Microbiology: Anti-infectives (From admission, onward)    None         Component Value Date/Time   SDES BLOOD RIGHT HAND 01/22/2024 1248   SPECREQUEST  01/22/2024 1248    BOTTLES DRAWN AEROBIC ONLY Blood Culture results may not be optimal due to an inadequate volume  of blood received in culture bottles   CULT  01/22/2024 1248    NO GROWTH 5 DAYS Performed at [facility], [address] [place], [place] [address]    REPTSTATUS 01/27/2024 FINAL 01/22/2024 1248    Procedures:    Chase LAMY, MD [facility] 05/22/2024, 11:10 AM   "

## 2024-05-22 NOTE — Progress Notes (Signed)
 Heart Failure Navigator Progress Note  Assessed for Heart & Vascular TOC clinic readiness.  Patient does not meet criteria due to he is an Advanced Heart Failure Team patient of Dr. Zenaida. .   Navigator will sign off at this time.   Stephane Haddock, BSN, Scientist, clinical (histocompatibility and immunogenetics) Only

## 2024-05-22 NOTE — Progress Notes (Signed)
 PHARMACY - ANTICOAGULATION CONSULT NOTE  Pharmacy Consult for Heparin  Indication: atrial fibrillation  Allergies[1]  Patient Measurements: Height: 6' (182.9 cm) Weight: 75 kg (165 lb 5.5 oz) IBW/kg (Calculated) : 77.6 Heparin  dosing weight: 68 kg  Vital Signs: Temp: 97.6 F (36.4 C) (12/29 0800) Temp Source: Oral (12/29 0800) BP: 119/68 (12/29 0800) Pulse Rate: 80 (12/29 0800)  Labs: Recent Labs    05/19/24 1418 05/19/24 1550 05/20/24 0739 05/20/24 1858 05/21/24 0826 05/21/24 1931 05/22/24 0723  HGB  --    < > 12.3*  --  11.3*  --  12.6*  HCT  --    < > 36.2*  --  34.7*  --  38.7*  PLT  --   --  377  --  305  --  350  APTT 39*  --  51*   < > 64* 82* 99*  LABPROT 23.6*  --   --   --   --   --   --   INR 2.0*  --   --   --   --   --   --   HEPARINUNFRC  --   --  >1.10*  --  >1.10*  --  >1.10*  CREATININE  --    < > 1.82*  --  0.95  --  0.62   < > = values in this interval not displayed.    Estimated Creatinine Clearance: 83.3 mL/min (by C-G formula based on SCr of 0.62 mg/dL).  Assessment: 63 yom with a history of HTN, T2DM, CAD, HF, AF on eliquis . Heparin  per pharmacy consult placed for atrial fibrillation.   Patient is on apixaban  prior to arrival. Last dose 12/25 pm or 12/26 am patient is unsure. Will require aPTT monitoring due to likely falsely high anti-Xa level secondary to DOAC use.  12/29 AM: aPTT therapeutic, continue current rate  Goal of Therapy:  Heparin  level 0.3-0.7 units/ml aPTT 66-102 seconds Monitor platelets by anticoagulation protocol: Yes   Plan:  Continue heparin  drip at 1450 units/hr Daily aPTT and heparin  level until correlating; daily CBC. Apixaban  on hold.  Donny Alert, PharmD, [facility] Clinical Pharmacist Please see AMION for all Pharmacists' Contact Phone Numbers 05/22/2024, 8:51 AM           [1]  Allergies Allergen Reactions   Codeine Nausea And Vomiting   Latex Rash    Oral blisters when used at dentist

## 2024-05-22 NOTE — Progress Notes (Addendum)
 Patient ID: Chase Combs, male   DOB: 12/12/1945, 77 y.o.   MRN: [id number]    Progress Note   Subjective  Day #3 CC; recurrent pseudoobstruction/colonic ileus in setting of severe heart failure with EF 20 to 25%/ischemic  IV heparin  running  NG tube has been clamped since yesterday, tolerating clear liquids.  He says he believes his abdomen is a bit softer than yesterday, fecal pouch in place, liquid brown stool.  Denies any nausea or abdominal pain  Labs today-WBC 5.7/hemoglobin 12.6/hematocrit 38.7 Potassium 4.2/sodium 138/BUN 23/creatinine 0.62   Objective   Vital signs in last 24 hours: Temp:  [97.5 F (36.4 C)-98.2 F (36.8 C)] 97.6 F (36.4 C) (12/29 0800) Pulse Rate:  [80-90] 80 (12/29 0800) Resp:  [16-18] 18 (12/29 0615) BP: (101-119)/(59-75) 119/68 (12/29 0800) SpO2:  [92 %-100 %] 96 % (12/29 0800) Weight:  [75 kg] 75 kg (12/29 0530) Last BM Date : 05/21/24 General:    Frail appearing elderly white male no acute distress Heart:  Regular rate and rhythm; no murmurs Lungs: Respirations even and unlabored, lungs CTA bilaterally Abdomen:  Soft, bowel sounds are present, he has some palpable loops of bowel, not currently tympanitic but distended Extremities:  Without edema. Neurologic:  Alert and oriented,  grossly normal neurologically. Psych:  Cooperative. Normal mood and affect.  Intake/Output from previous day: 12/28 0701 - 12/29 0700 In: 3313.6 [P.O.:240; I.V.:2521.2; IV Piggyback:552.4] Out: 450 [Urine:450] Intake/Output this shift: No intake/output data recorded.  Lab Results: Recent Labs    05/20/24 0739 05/21/24 0826 05/22/24 0723  WBC 4.3 4.3 5.7  HGB 12.3* 11.3* 12.6*  HCT 36.2* 34.7* 38.7*  PLT 377 305 350   BMET Recent Labs    05/20/24 0739 05/21/24 0826 05/21/24 1931 05/22/24 0723  NA 129* 134*  --  138  K 3.0* 3.0* 4.4 4.2  CL 93* 105  --  109  CO2 21* 17*  --  21*  GLUCOSE 79 48*  --  81  BUN 74* 44*  --  23  CREATININE 1.82*  0.95  --  0.62  CALCIUM  7.9* 7.7*  --  7.8*   LFT Recent Labs    05/19/24 1632  PROT 5.6*  ALBUMIN 2.9*  AST 31  ALT 57*  ALKPHOS 106  BILITOT 0.4   PT/INR Recent Labs    05/19/24 1418  LABPROT 23.6*  INR 2.0*    Studies/Results: DG Abd 1 View Result Date: 05/21/2024 CLINICAL DATA:  Ileus. EXAM: DG ABDOMEN 1V COMPARISON:  Radiograph yesterday FINDINGS: Tip and side port of the enteric tube below the diaphragm in the stomach. Stable diffuse gaseous distention of bowel loops, primarily colon. Few prominent air-filled loops of small bowel are also seen, mild improvement in small bowel distension. No obvious free air or pneumatosis. IMPRESSION: Stable gaseous colonic distension consistent with colonic ileus, although mild improvement in small bowel dilatation after NG tube placement. Electronically Signed   By: Andrea Gasman M.D.   On: 05/21/2024 16:52       Assessment / Plan:    #34 78 year old male admitted with hypotension, nausea, diarrhea and abdominal pain/distention.  He has had 2 or 3 recent admissions with similar presentations with primarily colonic ileus/Ogilvie's type picture. He did require colonic decompression in September 2025.  He has been managed conservatively on the last 2 admissions with NG decompression, Flexi-Seal, MiraLAX , Relistor  etc. Suspect that these recurrent episodes are precipitated by low flow state in the setting of his severe heart failure  NG tube has been clamped since yesterday, patient tolerating.  Has tolerated clear liquids no complaints of nausea, no pain and feels that his abdomen is overall softer Stool liquid in Flexi-Seal  KUB yesterday stable with some mild improvement in the small bowel dilation, stable diffuse gaseous distention of the colon   #2 atrial fibrillation-generally on Eliquis , currently on IV heparin  #3 sleep apnea with CPAP tolerance #4 diabetes mellitus  #5.  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 Continue to watch electrolytes carefully And if he does not have continued improvement over the next 24 hours then we will give enemas tomorrow which have been helpful in the past and then replace the rectal tube. GI will continue to follow with you       Principal Problem:   Pseudo-obstruction of colon Active Problems:   Coronary artery disease involving native coronary artery of native heart without angina pectoris   Hypokalemia   Ogilvie syndrome   PUD (peptic ulcer disease)   Acute kidney injury   Paroxysmal atrial flutter (HCC)   Chronic combined systolic and diastolic congestive heart failure (HCC)   Thyroid  nodule   Renal lesion     LOS: 3 days   Amy EsterwoodPA-C  05/22/2024, 9:44 AM  I have taken an interval history, thoroughly reviewed the chart and examined the patient. I agree with the Advanced Practitioner's note, impression and recommendations, and have recorded additional findings, impressions and recommendations below. I performed a substantive portion of this encounter (>50% time spent), including a complete performance of the medical decision making.  My additional thoughts are as follows:  Signout received from Dr. Suzann and additional extensive chart review performed due to this patient's medical complexity. I also personally reviewed his last 2 KUB images.  He is softly distended with tinkling bowel sounds and little or no tenderness.  Frail elderly man, alert and conversational and oriented.  Breathing comfortably and without chest pain  Liquid stool without blood in the fecal collection system.   This is a challenging scenario of a severe chronic problem in a patient with significant cardiac  comorbidity.  This chronic colonic pseudoobstruction has thus far been recurrent despite multiple treatments outlined in multiple extensive prior notes.  Dr. Suzann suggested the possibility of using oral pyridostigmine for its cholinergic activity in hopes that it may improve his bowel motility.  We would need the opinion of the patient's cardiology team regarding the safety of trying this medication given the risk of bradycardia in the context of his severe CHF. If it were to be tried, the inpatient setting would seem the optimal place to do so while on cardiac monitor, but we would still need their opinion and further discussion with the patient regarding the risks and benefits.   _________________  This consultation required a moderate degree of medical decision making due to the nature and complexity of the acute condition(s) being evaluated as well as the patient's medical comorbidities.  Victory LITTIE Brand III [phone]

## 2024-05-23 ENCOUNTER — Inpatient Hospital Stay (HOSPITAL_COMMUNITY)

## 2024-05-23 DIAGNOSIS — R140 Abdominal distension (gaseous): Secondary | ICD-10-CM | POA: Diagnosis not present

## 2024-05-23 DIAGNOSIS — K5981 Ogilvie syndrome: Secondary | ICD-10-CM | POA: Diagnosis not present

## 2024-05-23 DIAGNOSIS — I4891 Unspecified atrial fibrillation: Secondary | ICD-10-CM | POA: Diagnosis not present

## 2024-05-23 DIAGNOSIS — G4730 Sleep apnea, unspecified: Secondary | ICD-10-CM | POA: Diagnosis not present

## 2024-05-23 LAB — BASIC METABOLIC PANEL WITH GFR
Anion gap: 6 (ref 5–15)
BUN: 14 mg/dL (ref 8–23)
CO2: 25 mmol/L (ref 22–32)
Calcium: 8.2 mg/dL — ABNORMAL LOW (ref 8.9–10.3)
Chloride: 104 mmol/L (ref 98–111)
Creatinine, Ser: 0.56 mg/dL — ABNORMAL LOW (ref 0.61–1.24)
GFR, Estimated: >60 mL/min
Glucose, Bld: 125 mg/dL — ABNORMAL HIGH (ref 70–99)
Potassium: 3.7 mmol/L (ref 3.5–5.1)
Sodium: 135 mmol/L (ref 135–145)

## 2024-05-23 LAB — CBC
HCT: 37.2 % — ABNORMAL LOW (ref 39.0–52.0)
Hemoglobin: 12.2 g/dL — ABNORMAL LOW (ref 13.0–17.0)
MCH: 29.9 pg (ref 26.0–34.0)
MCHC: 32.8 g/dL (ref 30.0–36.0)
MCV: 91.2 fL (ref 80.0–100.0)
Platelets: 356 K/uL (ref 150–400)
RBC: 4.08 MIL/uL — ABNORMAL LOW (ref 4.22–5.81)
RDW: 17.4 % — ABNORMAL HIGH (ref 11.5–15.5)
WBC: 6 K/uL (ref 4.0–10.5)
nRBC: 0 % (ref 0.0–0.2)

## 2024-05-23 LAB — APTT
aPTT: 47 s — ABNORMAL HIGH (ref 24–36)
aPTT: 46 s — ABNORMAL HIGH (ref 24–36)

## 2024-05-23 LAB — HEPARIN LEVEL (UNFRACTIONATED): Heparin Unfractionated: 0.72 [IU]/mL — ABNORMAL HIGH (ref 0.30–0.70)

## 2024-05-23 MED ORDER — BUDESONIDE 0.25 MG/2ML IN SUSP
0.2500 mg | Freq: Two times a day (BID) | RESPIRATORY_TRACT | Status: DC
  Administered 2024-05-23 – 2024-06-01 (×18): 0.25 mg via RESPIRATORY_TRACT
  Filled 2024-05-23 (×19): qty 2

## 2024-05-23 MED ORDER — SERTRALINE HCL 50 MG PO TABS
50.0000 mg | ORAL_TABLET | Freq: Every morning | ORAL | Status: DC
  Administered 2024-05-23 – 2024-06-01 (×10): 50 mg via ORAL
  Filled 2024-05-23 (×10): qty 1

## 2024-05-23 MED ORDER — PANTOPRAZOLE SODIUM 40 MG PO TBEC
40.0000 mg | DELAYED_RELEASE_TABLET | Freq: Every day | ORAL | Status: DC
  Administered 2024-05-23 – 2024-06-01 (×10): 40 mg via ORAL
  Filled 2024-05-23 (×10): qty 1

## 2024-05-23 MED ORDER — HEPARIN (PORCINE) 25000 UT/250ML-% IV SOLN
INTRAVENOUS | Status: AC
  Filled 2024-05-23: qty 250

## 2024-05-23 MED ORDER — APIXABAN 5 MG PO TABS
5.0000 mg | ORAL_TABLET | Freq: Two times a day (BID) | ORAL | Status: DC
  Administered 2024-05-23 – 2024-06-01 (×19): 5 mg via ORAL
  Filled 2024-05-23 (×19): qty 1

## 2024-05-23 MED ORDER — HYDROXYZINE HCL 25 MG PO TABS: 25.0000 mg | ORAL_TABLET | Freq: Every evening | ORAL | Status: DC | PRN

## 2024-05-23 MED ORDER — VITAMIN B-12 1000 MCG PO TABS
1000.0000 ug | ORAL_TABLET | Freq: Every day | ORAL | Status: DC
  Administered 2024-05-23 – 2024-06-01 (×10): 1000 ug via ORAL
  Filled 2024-05-23 (×10): qty 1

## 2024-05-23 NOTE — Plan of Care (Signed)
" °  Problem: Education: Goal: Knowledge of General Education information will improve Description: Including pain rating scale, medication(s)/side effects and non-pharmacologic comfort measures Outcome: Progressing   Problem: Health Behavior/Discharge Planning: Goal: Ability to manage health-related needs will improve Outcome: Progressing   Problem: Clinical Measurements: Goal: Will remain free from infection Outcome: Progressing   Problem: Clinical Measurements: Goal: Diagnostic test results will improve Outcome: Progressing   Problem: Activity: Goal: Risk for activity intolerance will decrease Outcome: Progressing   Problem: Safety: Goal: Ability to remain free from injury will improve Outcome: Progressing   Problem: Skin Integrity: Goal: Risk for impaired skin integrity will decrease Outcome: Progressing   "

## 2024-05-23 NOTE — Plan of Care (Signed)
" °  Problem: Health Behavior/Discharge Planning: Goal: Ability to manage health-related needs will improve Outcome: Progressing   Problem: Clinical Measurements: Goal: Will remain free from infection Outcome: Progressing   Problem: Clinical Measurements: Goal: Diagnostic test results will improve Outcome: Progressing   Problem: Pain Managment: Goal: General experience of comfort will improve and/or be controlled Outcome: Progressing   "

## 2024-05-23 NOTE — Progress Notes (Addendum)
 Patient ID: Chase Combs, male   DOB: 09-07-1945, 78 y.o.   MRN: [id number]    Progress Note   Subjective   Day # 4 CC; abdominal pain nausea vomiting diarrhea-recurrent pseudoobstruction/ileus in setting of severe heart failure with EF 20 to 25%/ischemic Patient has had previous admissions this year with similar symptoms, and had an episode of severe ischemic colitis August 2025 after acute MI  NG tube removed yesterday, patient has tolerated liquids, says he would like to try soft diet today, abdomen feels better, says he does have some mild tenderness, no nausea.  He is frustrated with the fecal collection system because he is having leakage and stools are loose, asking why he cannot use depends Does not want to stay on MiraLAX  because it gives  the runs.  KUB today pending  Labs WBC 6.0/hemoglobin 12.2/hematocrit 37.2 Sodium 135/potassium 3.7/BUN 14/creatinine 0.56   Objective   Vital signs in last 24 hours: Temp:  [97.4 F (36.3 C)-98.7 F (37.1 C)] 98.1 F (36.7 C) (12/30 0800) Pulse Rate:  [72-82] 77 (12/30 0800) Resp:  [16-17] 16 (12/30 0800) BP: (90-110)/(58-69) 105/69 (12/30 0800) SpO2:  [95 %-98 %] 98 % (12/30 0800) Weight:  [74.9 kg] 74.9 kg (12/30 0500) Last BM Date : 05/23/24 General:    Elderly white male in NAD Heart:  Regular rate and rhythm; no murmurs Lungs: Respirations even and unlabored, lungs CTA bilaterally Abdomen:  Soft,, bowel sounds are present, there are some palpable dilated loops but he is not grossly distended and tympanitic as has been in the past Extremities:  Without edema. Neurologic:  Alert and oriented,  grossly normal neurologically. Psych:  Cooperative. Normal mood and affect.  Intake/Output from previous day: 12/29 0701 - 12/30 0700 In: 3 [I.V.:3] Out: -  Intake/Output this shift: Total I/O In: -  Out: 250 [Urine:250]  Lab Results: Recent Labs    05/21/24 0826 05/22/24 0723 05/23/24 0528  WBC 4.3 5.7 6.0  HGB 11.3*  12.6* 12.2*  HCT 34.7* 38.7* 37.2*  PLT 305 350 356   BMET Recent Labs    05/21/24 0826 05/21/24 1931 05/22/24 0723 05/23/24 0528  NA 134*  --  138 135  K 3.0* 4.4 4.2 3.7  CL 105  --  109 104  CO2 17*  --  21* 25  GLUCOSE 48*  --  81 125*  BUN 44*  --  23 14  CREATININE 0.95  --  0.62 0.56*  CALCIUM  7.7*  --  7.8* 8.2*   LFT No results for input(s): PROT, ALBUMIN, AST, ALT, ALKPHOS, BILITOT, BILIDIR, IBILI in the last 72 hours. PT/INR No results for input(s): LABPROT, INR in the last 72 hours.      Assessment / Plan:    #79 78 year old white male admitted with hypotension, nausea, diarrhea abdominal pain/distention.  He has had 2 or 3 previous admissions this year with similar presentation and has had a pseudoobstruction/diffuse ileus though primarily colonic ileus picture. He required colonic decompression in September 2025. He had an episode of severe ischemic colitis August 2025 PostMI Has been managed conservatively with his last 2 admissions with NG decompression, high-dose MiraLAX , Relistor  and enemas  We have suspected that these episodes are related to recurrent issues with low flow state  Has tolerated clear liquids, asking for regular diet abdomen soft for him  Relistor  started yesterday  #2 atrial fibrillation-generally on Eliquis -currently on IV heparin  #3 sleep apnea with CPAP intolerance, using BiPAP here #4 diabetes mellitus #5 chronic  kidney disease stage II #6 urinary retention  Plan; advance to soft diet Continue MiraLAX  twice daily And Relistor  8 mg daily x 2 more days Follow-up this a.m.'s films. Patient asking for Flexi-Seal to be removed and to use depends-Will place order though usually not done while inpatient     Principal Problem:   Colonic pseudoobstruction Active Problems:   Coronary artery disease involving native coronary artery of native heart without angina pectoris   Hypokalemia   Ogilvie syndrome   PUD  (peptic ulcer disease)   Acute kidney injury   Paroxysmal atrial flutter (HCC)   Chronic combined systolic and diastolic congestive heart failure (HCC)   Thyroid  nodule   Renal lesion   Abdominal distention     LOS: 4 days   Amy Esterwood PA-C 05/23/2024, 10:10 AM    I have taken an interval history, thoroughly reviewed the chart and examined the patient. I agree with the Advanced Practitioner's note, impression and recommendations, and have recorded additional findings, impressions and recommendations below. I performed a substantive portion of this encounter (>50% time spent), including a complete performance of the medical decision making.  My additional thoughts are as follows:  While this patient has improved since admission from the standpoint of his abdominal distention, and both he and his family report that he is back to his home baseline in that regard, he is still having significant liquid output from the rectal tube (due to the colonic pseudoobstruction), and I am concerned that he will have recurrence of worsening symptoms after discharge.  It sounds like he is fairly ambulatory at home, and improvement in his volume and electrolyte status have probably helped improve his GI motility since admission.  I talked to him and his family at length about the possibility of trying pyridostigmine and that I would communicated with the cardiology service both yesterday and again today.  I reviewed their note both yesterday and today and greatly appreciate their input.  Chase Combs understands that if we tried this medication it must be done in the hospital setting due to its potential cardiac side effects that would include cardiac rhythm abnormalities, most notably bradycardia that could be quite serious and even fatal.  He has a DNR status and is clear that he would not want to be revived in the event of a cardiac arrest. The on the cardiac risk, this medicine could cause other  undesirable side effects such as blurred vision, excessive salivation and other cholinergic symptoms.  He would like to give it further consideration and let us  know his decision tomorrow morning.  I confirmed that he is currently on a continuous cardiac monitor.  If we were to start that medication, he would be at very low intravenous dose as recommended by Dr. Lavona yesterday.  He needs to keep the fecal collection system and in the meantime, both for sanitary reasons but also to decrease the chance of urinary tract infection or buttock/sacral skin breakdown and infection.  _________________  This consultation required a high degree of medical decision making due to the nature and complexity of the acute condition(s) being evaluated as well as the patient's medical comorbidities.  I spent total of 40 minutes in both face-to-face ( interview/exam) and non-face-to-face (  chart review, care coordination, documentation)  activities, excluding procedures performed, for the visit on the date of this encounter.    Chase Combs Office:[phone]

## 2024-05-23 NOTE — Progress Notes (Signed)
 Occupational Therapy Treatment Patient Details Name: Chase Combs MRN: [id number] DOB: 1945/07/21 Today's Date: 05/23/2024   History of present illness Pt is a 78 y.o male admitted from [facility] for N/V/D. 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 PMH: NSTEMI, CAD, heart failure, a fib, HTN, HLD, DMII, CKD, OSA, prostate cancer, Ogilvie syndrome   OT comments  Pt eager to participate in OOB activities, c/o dizziness and fatigue.  Pt BP soft, increased dizziness with activity, no LOB.  Supine 97/64 Sitting 84/60 (67) Standing 63/43 (51) Standing 3 mins 66/43 (51) Sitting after 40 feet ambulation 70/58 (65) Supine at end of session 117/79. Pt supervision to EOB with HOB elevated and bed rails, increased time due to weakness/fatigue. Pt stands with increased effort, CGA, RW for support. Assisted Pt with perineal hygiene, Pt tolerated standing/activity well. Pt able to don socks sitting EOB without physical assist. Pt ambulated 40 feet around room, CGA, assisted back to bed. Pt sat up in chair position in bed. Pt would benefit from continued acute OT to maximize activity tolerance and functional strength, DC plan HHOT follow up still recommended.       If plan is discharge home, recommend the following:  A lot of help with walking and/or transfers;A lot of help with bathing/dressing/bathroom;Assistance with cooking/housework;Assist for transportation   Equipment Recommendations  None recommended by OT    Recommendations for Other Services      Precautions / Restrictions Precautions Precautions: Fall;Other (comment) Recall of Precautions/Restrictions: Intact Precaution/Restrictions Comments: watch BP,  rectal pouch Restrictions Weight Bearing Restrictions Per Provider Order: No       Mobility Bed Mobility Overal bed mobility: Needs Assistance Bed Mobility: Supine to Sit, Sit to Supine     Supine to sit: Supervision, HOB elevated, Used rails Sit to supine:  Supervision   General bed mobility comments: supervision for safety    Transfers Overall transfer level: Needs assistance Equipment used: Rolling walker (2 wheels) Transfers: Sit to/from Stand, Bed to chair/wheelchair/BSC Sit to Stand: Contact guard assist     Step pivot transfers: Contact guard assist     General transfer comment: CGA overall. Pt stands with legs first then torso follows, increased time     Balance Overall balance assessment: Mild deficits observed, not formally tested                                         ADL either performed or assessed with clinical judgement   ADL Overall ADL's : Needs assistance/impaired Eating/Feeding: Independent   Grooming: Set up;Sitting               Lower Body Dressing: Minimal assistance;Sitting/lateral leans;Sit to/from stand   Toilet Transfer: Contact guard assist;Rolling walker (2 wheels)   Toileting- Clothing Manipulation and Hygiene: Moderate assistance;Sit to/from stand       Functional mobility during ADLs: Contact guard assist;Rolling walker (2 wheels) General ADL Comments: Pt CGA for STS and mobility using RW for support. Pt ambulated 40 feet with increased time. Pt needs help with perineal hygiene while standing. Pt able to don/doff socks sitting EOB.    Extremity/Trunk Assessment Upper Extremity Assessment Upper Extremity Assessment: RUE deficits/detail RUE Deficits / Details: Pt with history of R shoulder impairments, states he needs TSA, overall WFLs for basic ADLs. Pt with 78 y.o. 3rd distal phalange valgus injury/dislocation. RUE: Shoulder pain with ROM RUE Sensation: WNL RUE Coordination:  WNL            Vision       Perception     Praxis     Communication Communication Communication: No apparent difficulties   Cognition Arousal: Alert Behavior During Therapy: Flat affect Cognition: No apparent impairments             OT - Cognition Comments: grossly WFLs                  Following commands: Intact        Cueing   Cueing Techniques: Verbal cues  Exercises      Shoulder Instructions       General Comments      Pertinent Vitals/ Pain       Pain Assessment Pain Assessment: No/denies pain  Home Living                                          Prior Functioning/Environment              Frequency  Min 2X/week        Progress Toward Goals  OT Goals(current goals can now be found in the care plan section)  Progress towards OT goals: Progressing toward goals  Acute Rehab OT Goals Patient Stated Goal: to improve activity tolerance OT Goal Formulation: With patient Time For Goal Achievement: 06/04/24 Potential to Achieve Goals: Good ADL Goals Pt Will Perform Upper Body Dressing: with supervision;sitting Pt Will Perform Lower Body Dressing: with supervision;sit to/from stand Pt Will Transfer to Toilet: with supervision;ambulating;regular height toilet Pt Will Perform Toileting - Clothing Manipulation and hygiene: with supervision;sit to/from stand;sitting/lateral leans Pt Will Perform Tub/Shower Transfer: Tub transfer;Shower transfer;with supervision;ambulating;rolling walker Additional ADL Goal #1: Pt will verbalize at least 3 energy conservation strategies for ADLs  Plan      Co-evaluation                 AM-PAC OT 6 Clicks Daily Activity     Outcome Measure   Help from another person eating meals?: None Help from another person taking care of personal grooming?: A Little Help from another person toileting, which includes using toliet, bedpan, or urinal?: A Lot Help from another person bathing (including washing, rinsing, drying)?: A Lot Help from another person to put on and taking off regular upper body clothing?: A Little Help from another person to put on and taking off regular lower body clothing?: A Little 6 Click Score: 17    End of Session Equipment Utilized During  Treatment: Gait belt;Rolling walker (2 wheels)  OT Visit Diagnosis: Other abnormalities of gait and mobility (R26.89);Muscle weakness (generalized) (M62.81);Unsteadiness on feet (R26.81)   Activity Tolerance Patient tolerated treatment well   Patient Left in bed;with call bell/phone within reach   Nurse Communication Mobility status        Time: 8751-8681 OT Time Calculation (min): 30 min  Charges: OT General Charges $OT Visit: 1 Visit OT Treatments $Self Care/Home Management : 23-37 mins  [place], OTR/L   Karynn Deblasi R Laylanie Kruczek 05/23/2024, 1:55 PM

## 2024-05-23 NOTE — Progress Notes (Addendum)
 PHARMACY - ANTICOAGULATION CONSULT NOTE  Pharmacy Consult for Heparin  Indication: atrial fibrillation  Allergies[1]  Patient Measurements: Height: 6' (182.9 cm) Weight: 74.9 kg (165 lb 2 oz) IBW/kg (Calculated) : 77.6 Heparin  dosing weight: 68 kg  Vital Signs: Temp: 97.4 F (36.3 C) (12/30 0627) Temp Source: Oral (12/30 0627) BP: 90/58 (12/30 0627) Pulse Rate: 72 (12/30 0627)  Labs: Recent Labs    05/21/24 0826 05/21/24 1931 05/22/24 0723 05/23/24 0528  HGB 11.3*  --  12.6* 12.2*  HCT 34.7*  --  38.7* 37.2*  PLT 305  --  350 356  APTT 64* 82* 99* 47*  HEPARINUNFRC >1.10*  --  >1.10* 0.72*  CREATININE 0.95  --  0.62 0.56*    Estimated Creatinine Clearance: 83.2 mL/min (A) (by C-G formula based on SCr of 0.56 mg/dL (L)).  Assessment: 48 yom with a history of HTN, T2DM, CAD, HF, AF on Eliquis . Heparin  per pharmacy consult placed for atrial fibrillation.   Patient is on apixaban  prior to arrival. Last dose 12/25 pm or 12/26 am patient is unsure. Will require aPTT monitoring due to likely falsely high anti-Xa level secondary to DOAC use.  aPTT is sub-therapeutic and inconsistent with trend.  RN that the pump peeps a couple of times but were a addressed right away.  CBC stable; no bleeding reported.  Goal of Therapy:  Heparin  level 0.3-0.7 units/ml aPTT 66-102 seconds Monitor platelets by anticoagulation protocol: Yes   Plan:  Check stat repeat aPTT  Medina Degraffenreid D. Lendell, PharmD, BCPS, BCCCP 05/23/2024, 7:06 AM  ---------------------------------------------------  Addendum: Repeat aPTT subtherapeutic; however, Pharmacy consulted to transition patient to Eliquis   Eliquis  5mg  PO BID - stop IV heparin  when Eliquis  is administered  Amarilis Belflower D. Lendell, PharmD, BCPS, BCCCP 05/23/2024, 9:06 AM      [1]  Allergies Allergen Reactions   Codeine Nausea And Vomiting   Latex Rash    Oral blisters when used at dentist

## 2024-05-23 NOTE — Progress Notes (Signed)
 " PROGRESS NOTE Chase Combs  FMW:[id number] DOB: 01/01/1946 DOA: 05/19/2024 PCP: Wonda Worth SQUIBB, PA  Brief Narrative/Hospital Course: Chase Combs is a 78 y.o. 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  He has not been able to keep oral intake and has chronic abdominal distention and nausea vomiting diarrhea. Recent prolonged admit 11/28-12/18- 2/2 recurrent Ogilvie's syndrome, severe malnutrition profound diarrhea> and discharged to SNF. In ED  afebrile and  spo2 mid 90s on  RA,  mild tachycardia and stable blood pressure. Labs-sodium 128, BUN 67, creatinine 1.96, normal WBC, troponin 49, and proBNP 10,122. CT is concerning for distal large bowel obstruction with transition point at the proximal sigmoid colon. GI was consulted by the ED, s/p ivf 500 cc LR and Reglan , and  NG tube and rectal tube ordered. Managed with conservative treatment, NG clamped and subsequently removed 12/29  Subjective: Seen and examined  Rectal tube remains in place-semisolid/liquid stool Abdomen remains moderately distended but no abdominal pain no nausea or vomiting NG tube out yesterday Overnight afebrile BP stable Labs reviewed this morning electrolytes remains fairly stable along with CBC  Assessment and plan:  Recurrent Pseudo-obstruction of colon/Ogilvie syndrome Recurrent abdominal pain distention nausea vomiting from above: S/P NG tube decompression x-ray repeat showed stable gaseous colonic distention Overall patient clinically improving, NG tube removed 12/29 and placed on diet-tolerating so far Appreciate GI inputs-continue MiraLAX  twice daily,Relistor  x3 doses ?  Pyridostigmine trial-cardiology okay with that with monitoring of  HR- if  remains longer may arrange cardiac monitor at home,  digoxin  discontinued. keep K above 4 mag above 2-and has been stable now Increase activity OOB and activity as tolerated.  AKI on CKD stage II Hypovolemic hyponatremia Hypokalemia Metabolic acidosis Recent urinary obstruction: Patient has electrolyte balance in the setting of nausea vomiting and obstruction, volume depletion. Electrolytes has stabilized and improved monitor in the setting of #1 Avoid nephrotoxic medication.  Resumed Flomax  Recent Labs    05/03/24 0314 05/06/24 0610 05/09/24 0611 05/10/24 1206 05/11/24 0512 05/19/24 1550 05/19/24 1632 05/20/24 0739 05/21/24 0826 05/21/24 1931 05/22/24 0723 05/23/24 0528  BUN 11 8 7* 9 7* 70* 67* 74* 44*  --  23 14  CREATININE 0.81 0.74 0.62 0.75 0.74 2.40* 1.96* 1.82* 0.95  --  0.62 0.56*  CO2 27 30 30 26 30   --  22 21* 17*  --  21* 25  K 2.8* 2.4* 2.4* 3.2* 3.0* 3.2* 3.4* 3.0* 3.0* 4.4 4.2 3.7    CAD Ischemic cardiomyopathy: Mildly elevated troponin but no chest pain.back on Plavix  as taking po  Chronic HFrEF Recent prolonged hospitalization complicated by cardiogenic shock with acute CHF with LAD stent stenosis 01/25/24: Last EF 20-25%, Global HKN on 02/21/24, proBNP at baseline 10,122-was 15,2454 in 06/26/24.   PTA on Jardiance , Aldactone ,- recently digoxin  Entresto  was held due to softer blood pressure Cardiology has been consulted -discontinue digoxin , and possible reinitiation of GDMT trial with Entresto .  Net IO Since Admission: 1,016.57 mL [05/23/24 1038]   Atrial fibrillation on Eliquis : On heparin > switch to oral Eliquis  as taking po, amiodarone  resumed  History of PUD: Continue PPI p.o.  T2DM: Last A1c normal 5.5 last month, blood sugar is stable  OSA: CPAP intolerance   Thyroid  nodules; renal lesions  Indeterminate thyroid  nodules and b/l renal lesions noted incidentally on CT in ED  Outpatient follow-up with thyroid  US  and renal protocol MRI recommended    Goals of care: Patient remains very high risk for readmission with complex medical comorbidities and at risk for acute decompensation, overall prognosis does not appear bright.He was seen by palliative care on his last admission-he is DNR limited with full scope of intervention.  Depression Anxiety: Resume SSRI, prn Atarax  Deconditioning debility: Cont pto PT Orders: Active PT Follow up Rec: Home Health Pt (If Slow Progress, Might Need To Reconsider Snf)05/22/2024 1200    DVT prophylaxis: Heparin  Code Status:   Code Status: Limited: Do not attempt resuscitation (DNR) -DNR-LIMITED -Do Not Intubate/DNI  Family Communication: plan of care discussed with patient at bedside. Patient status is: Remains hospitalized because of severity of illness Level of care: Telemetry   Dispo: The patient is from: SNF [facility].  prior to SNF was living at  home w/ wife            Anticipated disposition: Home health versus SNF pending clinical improvement   Objective: Vitals last 24 hrs: Vitals:   05/22/24 2234 05/23/24 0500 05/23/24 0627 05/23/24 0800  BP: 110/69  (!) 90/58 105/69  Pulse: 82  72 77  Resp: 17  17 16   Temp: 97.7 F (36.5 C)  (!) 97.4 F (36.3 C) 98.1 F (36.7 C)  TempSrc:   Oral Oral  SpO2: 97%  97% 98%  Weight:  74.9 kg    Height:        Physical Examination: General exam: AAOX3, pleasant, HEENT:Oral mucosa moist, Ear/Nose WNL grossly Respiratory system: Bilaterally clear Cardiovascular system: S1 & S2 +, No JVD. Gastrointestinal system: Abdomen moderately distended, nontender ,B sluggish Nervous System: Alert, awake, moving all extremities,and following commands. Extremities: extremities warm, leg edema + Skin: Warm, no rashes MSK: Normal muscle bulk,tone, power   Medications reviewed:  Scheduled Meds:  amiodarone   200 mg Oral Daily   apixaban   5 mg Oral BID   budesonide   0.25 mg Nebulization BID   clopidogrel   75 mg Oral Daily   cyanocobalamin   1,000 mcg Oral  Daily   methylnaltrexone   8 mg Subcutaneous Q24H   pantoprazole   40 mg Oral Daily   polyethylene glycol  17 g Oral BID   revefenacin   175 mcg Nebulization Daily   sertraline   50 mg Oral q morning   sodium chloride  flush  3 mL Intravenous Q12H   tamsulosin   0.4 mg Oral QHS   Continuous Infusions:   Diet: Diet Order             Diet Heart Room service appropriate? Yes; Fluid consistency: Thin  Diet effective now                   Data Reviewed: I have personally reviewed following labs and imaging studies ( see epic result tab) CBC: Recent Labs  Lab 05/19/24 1326 05/19/24 1550 05/20/24 0739 05/21/24 0826 05/22/24 0723 05/23/24 0528  WBC 5.6  --  4.3 4.3 5.7 6.0  NEUTROABS 4.0  --   --   --   --   --   HGB 13.8 14.3 12.3* 11.3* 12.6* 12.2*  HCT 41.2 42.0 36.2* 34.7* 38.7* 37.2*  MCV 91.4  --  89.4 92.0 91.7 91.2  PLT 480*  --  377 305 350 356   CMP: Recent Labs  Lab 05/19/24 1326 05/19/24 1550 05/19/24 1632 05/20/24 0739 05/21/24 0826 05/21/24 1931  05/22/24 0723 05/23/24 0528  NA  --    < > 128* 129* 134*  --  138 135  K  --    < > 3.4* 3.0* 3.0* 4.4 4.2 3.7  CL  --    < > 91* 93* 105  --  109 104  CO2  --   --  22 21* 17*  --  21* 25  GLUCOSE  --    < > 70 79 48*  --  81 125*  BUN  --    < > 67* 74* 44*  --  23 14  CREATININE  --    < > 1.96* 1.82* 0.95  --  0.62 0.56*  CALCIUM   --   --  8.2* 7.9* 7.7*  --  7.8* 8.2*  MG 1.8  --   --  2.1  --   --   --   --    < > = values in this interval not displayed.   GFR: Estimated Creatinine Clearance: 83.2 mL/min (A) (by C-G formula based on SCr of 0.56 mg/dL (L)). Recent Labs  Lab 05/19/24 1632  AST 31  ALT 57*  ALKPHOS 106  BILITOT 0.4  PROT 5.6*  ALBUMIN 2.9*    Recent Labs  Lab 05/19/24 1632  LIPASE <10*   No results for input(s): AMMONIA in the last 168 hours. Coagulation Profile:  Recent Labs  Lab 05/19/24 1418  INR 2.0*   Unresulted Labs (From admission, onward)    None       Antimicrobials/Microbiology: Anti-infectives (From admission, onward)    None         Component Value Date/Time   SDES BLOOD RIGHT HAND 01/22/2024 1248   SPECREQUEST  01/22/2024 1248    BOTTLES DRAWN AEROBIC ONLY Blood Culture results may not be optimal due to an inadequate volume of blood received in culture bottles   CULT  01/22/2024 1248    NO GROWTH 5 DAYS Performed at [facility], [address], [place], [place] [address]    REPTSTATUS 01/27/2024 FINAL 01/22/2024 1248    Procedures:  Mennie LAMY, MD [facility] 05/23/2024, 10:40 AM   "

## 2024-05-23 NOTE — Progress Notes (Addendum)
 "  Progress Note  Patient Name: Chase Combs Date of Encounter: 05/23/2024 [facility] Cardiologist: Alm Clay, MD   Interval Summary   Denies any chest pain or shortness of breath.  Abdominal discomfort and nausea are improving.  Vital Signs Vitals:   05/22/24 2234 05/23/24 0500 05/23/24 0627 05/23/24 0800  BP: 110/69  (!) 90/58 105/69  Pulse: 82  72 77  Resp: 17  17 16   Temp: 97.7 F (36.5 C)  (!) 97.4 F (36.3 C) 98.1 F (36.7 C)  TempSrc:   Oral Oral  SpO2: 97%  97% 98%  Weight:  74.9 kg    Height:        Intake/Output Summary (Last 24 hours) at 05/23/2024 1045 Last data filed at 05/23/2024 0900 Gross per 24 hour  Intake 3 ml  Output 250 ml  Net -247 ml      05/23/2024    5:00 AM 05/22/2024    5:30 AM 05/21/2024    5:00 AM  Last 3 Weights  Weight (lbs) 165 lb 2 oz 165 lb 5.5 oz 149 lb 14.6 oz  Weight (kg) 74.9 kg 75 kg 68 kg      Telemetry/ECG  Normal sinus rhythm with heart rates in the 70s to 90s- Personally Reviewed  Physical Exam  GEN: No acute distress.   Neck: No JVD Cardiac: RRR, no murmurs, rubs, or gallops.  Respiratory: Mild bibasilar crackles. GI: Slightly tender and distended. MS: No edema  Assessment & Plan  Chase Combs is a 78 y.o. male with PMH of recurrent colonic pseudo-obstruction/Ogilvie syndrome, CKD stage II, chronic biventricular HFrEF due to ischemic cardiomyopathy, NSTEMI s/p DES to LAD (01/17/24), remote h/o STEMI s/p PCI with BMS to pRCA (2010), s/p PCI with BMS to pRI (2006), paroxysmal atrial flutter, HTN, HLD, diabetes mellitus type 2, OSA, PUD, prostate cancer who was seen for the evaluation of pyridostigmine use.   Recurrent pseudoobstruction of colon Has had recurrent abdominal pain, nausea, vomiting.  Previously had NG tube decompression.  Was recently placed on a diet and is tolerating. Yesterday it was felt like the patient would tolerate pyridostigmine. Management per primary   Persistent atrial  fibrillation The patient was recently placed on a diet.  Because of this IV heparin  was stopped and was started on Eliquis . Continue Eliquis  5mg  BID Continue Amiodarone  200mg  daily.   Chronic biventricular HFrEF Prior TEE on 01/2024 showed a reduced LVEF of 20 to 25%, global hypokinesis, moderately reduced RV systolic function, moderately dilated left atrium, mild MR, and mild aortic valve calcification. GDMT Start Entresto  24-26 twice daily   CAD s/p DES to LAD on 12/2023.  Also has a remote history of multiple PCI's. Continue Plavix  75mg  daily   Otherwise management per primary  For questions or updates, please contact  [facility] Please consult [email or web address] for contact info under    Signed, Morse Clause, PA-C   History and all data above reviewed.  I personally took the history today  He reports that he is feeling better and his diet is being advanced.  No chest pain.  I reviewed all relevant tests and studies. Patient examined.  I agree with the findings as above.     All available labs, radiology testing, previous records reviewed. Agree with documented assessment and plan.   Pseudo obstruction of the colon:     Still has not started pyridostigmine.  Will need to know from primary team if they start this as I would arrange follow  up monitoring.    We will see as needed.  Please contact us  if pyridostigmine is started.      Lynwood Tonette Koehne  12:23 PM  05/23/2024  "

## 2024-05-24 DIAGNOSIS — K5981 Ogilvie syndrome: Secondary | ICD-10-CM | POA: Diagnosis not present

## 2024-05-24 LAB — BASIC METABOLIC PANEL WITH GFR
Anion gap: 7 (ref 5–15)
BUN: 13 mg/dL (ref 8–23)
CO2: 25 mmol/L (ref 22–32)
Calcium: 8.1 mg/dL — ABNORMAL LOW (ref 8.9–10.3)
Chloride: 102 mmol/L (ref 98–111)
Creatinine, Ser: 0.58 mg/dL — ABNORMAL LOW (ref 0.61–1.24)
GFR, Estimated: >60 mL/min
Glucose, Bld: 96 mg/dL (ref 70–99)
Potassium: 3.3 mmol/L — ABNORMAL LOW (ref 3.5–5.1)
Sodium: 134 mmol/L — ABNORMAL LOW (ref 135–145)

## 2024-05-24 LAB — CBC
HCT: 36.8 % — ABNORMAL LOW (ref 39.0–52.0)
Hemoglobin: 12.4 g/dL — ABNORMAL LOW (ref 13.0–17.0)
MCH: 30.4 pg (ref 26.0–34.0)
MCHC: 33.7 g/dL (ref 30.0–36.0)
MCV: 90.2 fL (ref 80.0–100.0)
Platelets: 314 K/uL (ref 150–400)
RBC: 4.08 MIL/uL — ABNORMAL LOW (ref 4.22–5.81)
RDW: 17.3 % — ABNORMAL HIGH (ref 11.5–15.5)
WBC: 5.6 K/uL (ref 4.0–10.5)
nRBC: 0 % (ref 0.0–0.2)

## 2024-05-24 LAB — MAGNESIUM: Magnesium: 1.8 mg/dL (ref 1.7–2.4)

## 2024-05-24 MED ORDER — HEPARIN (PORCINE) 25000 UT/250ML-% IV SOLN
INTRAVENOUS | Status: AC
  Filled 2024-05-24: qty 250

## 2024-05-24 MED ORDER — SODIUM CHLORIDE 0.9 % IV BOLUS
250.0000 mL | Freq: Once | INTRAVENOUS | Status: AC
  Administered 2024-05-24: 250 mL via INTRAVENOUS

## 2024-05-24 MED ORDER — POTASSIUM CHLORIDE CRYS ER 20 MEQ PO TBCR
40.0000 meq | EXTENDED_RELEASE_TABLET | Freq: Once | ORAL | Status: AC
  Administered 2024-05-24: 40 meq via ORAL
  Filled 2024-05-24: qty 2

## 2024-05-24 MED ORDER — POTASSIUM CHLORIDE CRYS ER 20 MEQ PO TBCR
20.0000 meq | EXTENDED_RELEASE_TABLET | Freq: Two times a day (BID) | ORAL | Status: DC
  Administered 2024-05-24 – 2024-05-28 (×10): 20 meq via ORAL
  Filled 2024-05-24 (×10): qty 1

## 2024-05-24 MED ORDER — PYRIDOSTIGMINE BROMIDE 60 MG/5ML PO SOLN
15.0000 mg | Freq: Two times a day (BID) | ORAL | Status: DC
  Administered 2024-05-24 – 2024-05-26 (×5): 15.6 mg via ORAL
  Filled 2024-05-24 (×6): qty 1.3

## 2024-05-24 NOTE — Progress Notes (Signed)
 Physical Therapy Treatment Patient Details Name: Chase Combs MRN: [id number] DOB: 1945-10-04 Today's Date: 05/24/2024   History of Present Illness Pt is a 78 y.o male admitted from [facility] for N/V/D. 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 Symptomatic orthostatic hypotension 12/31 during PT session. PMH: NSTEMI, CAD, heart failure, a fib, HTN, HLD, DMII, CKD, OSA, prostate cancer, Ogilvie syndrome    PT Comments  Pt received in supine, agreeable to therapy session, with good participation and fair tolerance for transfer training. Pt limited this session due to symptomatic drop in BP with standing, RN/MD notified due to severity of orthostatic hypotension. Pt also with severely leaking rectal tube, MD/RN notified as this is increasing his risk of moisture-related skin breakdown around tubing. NT notified to assist pt with a bath per his request once pt up in chair, PTA did place new paper pad under him when soiled bed pad removed, but it is already soiled from step pivot transfer. Pt needing up to minA +2 to perform step pivot with RW due to symptoms and multiple lines. Pt continues to benefit from PT services to progress toward functional mobility goals, continue to recommend HHPT vs SNF pending pt progress. Orthostatic Lying   BP- Lying 96/61  Pulse- Lying 73  Orthostatic Sitting  BP- Sitting 93/62  Pulse- Sitting 89  Orthostatic Standing at 0 minutes  BP- Standing at 0 minutes (!) 56/41 (MAP (47))  Pulse- Standing at 0 minutes  (UTA due to need to assist pt physically/with lines and leaking rectal tube)  Orthostatic Standing at 3 minutes  BP- Standing at 3 minutes  (not safe to assess due to low BP when initially checked)   BP 99/67  BP Location Left Arm  BP Method Automatic  Patient Position (if appropriate)  (Recliner chair with legs up)     If plan is discharge home, recommend the following: A lot of help with walking and/or transfers;A lot of help with  bathing/dressing/bathroom;Assistance with cooking/housework;Assist for transportation;Help with stairs or ramp for entrance   Can travel by private vehicle        Equipment Recommendations  None recommended by PT (well-equipped)    Recommendations for Other Services       Precautions / Restrictions Precautions Precautions: Fall;Other (comment) Recall of Precautions/Restrictions: Intact Precaution/Restrictions Comments: watch BP,  rectal pouch Restrictions Weight Bearing Restrictions Per Provider Order: No     Mobility  Bed Mobility Overal bed mobility: Needs Assistance Bed Mobility: Supine to Sit, Rolling Rolling: Contact guard assist, Used rails   Supine to sit: Supervision, HOB elevated, Used rails     General bed mobility comments: CGA for rolling to L/R sides to clean up soiled bed pad prior to EOB transfer and new bed pad placed; supervision for safety and line mgmt to get to EOB    Transfers Overall transfer level: Needs assistance Equipment used: Rolling walker (2 wheels) Transfers: Sit to/from Stand, Bed to chair/wheelchair/BSC Sit to Stand: Contact guard assist, +2 safety/equipment   Step pivot transfers: +2 physical assistance, +2 safety/equipment, Min assist       General transfer comment: EOB>RW CGA, minA for RW managemetn and safety due to leaking flexi-seal and while pivoting due to increased c/o fatigue and lightheadedness; noted to have severe BP drop in standing so defer gait trial. BP improved in recliner.    Ambulation/Gait               General Gait Details: unsafe to attempt due to BP MAP (  47) when pt stood to pivot to chair   Stairs             Wheelchair Mobility     Tilt Bed    Modified Rankin (Stroke Patients Only)       Balance Overall balance assessment: Mild deficits observed, not formally tested                                          Communication Communication Communication: No apparent  difficulties  Cognition Arousal: Alert Behavior During Therapy: Flat affect   PT - Cognitive impairments: No apparent impairments                         Following commands: Intact      Cueing Cueing Techniques: Verbal cues, Gestural cues  Exercises      General Comments General comments (skin integrity, edema, etc.): see BP in comments above; HR WFL, SpO2 WFL on RA      Pertinent Vitals/Pain Pain Assessment Pain Assessment: No/denies pain    Home Living                          Prior Function            PT Goals (current goals can now be found in the care plan section) Acute Rehab PT Goals Patient Stated Goal: get stronger and return home PT Goal Formulation: With patient Time For Goal Achievement: 06/05/24 Progress towards PT goals: Progressing toward goals    Frequency    Min 2X/week      PT Plan      Co-evaluation              AM-PAC PT 6 Clicks Mobility   Outcome Measure  Help needed turning from your back to your side while in a flat bed without using bedrails?: A Little Help needed moving from lying on your back to sitting on the side of a flat bed without using bedrails?: A Little Help needed moving to and from a bed to a chair (including a wheelchair)?: A Little Help needed standing up from a chair using your arms (e.g., wheelchair or bedside chair)?: A Little Help needed to walk in hospital room?: Total (due to low BP) Help needed climbing 3-5 steps with a railing? : Total 6 Click Score: 14    End of Session Equipment Utilized During Treatment: Gait belt Activity Tolerance: Patient tolerated treatment well;Treatment limited secondary to medical complications (Comment);Other (comment);Patient limited by fatigue (symptomatic drop in BP standing and leaking flexi-seal) Patient left: in chair;with call bell/phone within reach;with chair alarm set;with family/visitor present;Other (comment) (recliner; spouse in  room) Nurse Communication: Mobility status;Precautions;Other (comment) (orthostatic hypotension with symptoms; leaking flexi-seal; pt requesting a bath) PT Visit Diagnosis: Other abnormalities of gait and mobility (R26.89);Muscle weakness (generalized) (M62.81)     Time: 8555-8477 PT Time Calculation (min) (ACUTE ONLY): 38 min  Charges:    $Therapeutic Activity: 38-52 mins PT General Charges $$ ACUTE PT VISIT: 1 Visit                     Cynthis Purington P., PTA Acute Rehabilitation Services Secure Chat Preferred 9a-5:30pm Office: [phone]    [name] [facility] 05/24/2024, 3:38 PM

## 2024-05-24 NOTE — Progress Notes (Signed)
 " PROGRESS NOTE DAYLON LAFAVOR  FMW:[id number] DOB: [birth year] DOA: 05/19/2024 PCP: Wonda Worth SQUIBB, PA  Brief Narrative/Hospital Course: KINLEY FERRENTINO is a 78 y.o. 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  He has not been able to keep oral intake and has chronic abdominal distention and nausea vomiting diarrhea. Recent prolonged admit 11/28-12/18- 2/2 recurrent Ogilvie's syndrome, severe malnutrition profound diarrhea> and discharged to SNF. In ED  afebrile and  spo2 mid 90s on  RA,  mild tachycardia and stable blood pressure. Labs-sodium 128, BUN 67, creatinine 1.96, normal WBC, troponin 49, and proBNP 10,122. CT is concerning for distal large bowel obstruction with transition point at the proximal sigmoid colon. GI was consulted by the ED, s/p ivf 500 cc LR and Reglan , and  NG tube and rectal tube ordered. Managed with conservative treatment, NG clamped and subsequently removed 12/29  Subjective: Seen and examined  Patient overall feeling better abdomen is still bloated and distended but somewhat better Overnight afebrile BP had been soft at times from 87-1 05 , labs this morning hypokalemia hyponatremia CBC overall stable He states he will go with the medication He still having output copious in the rectal tube  Assessment and plan:  Recurrent Pseudo-obstruction of colon/Ogilvie syndrome Recurrent abdominal pain distention nausea vomiting from above: S/P NG tube decompression, overall with some clinical improvement and NG tube removed 12/29 and diet started Patient was placed on Relistor  x 3 doses, rectal tube to be continued.  GI following X-ray abdomen 12/30 shows marked gaseous distention of colon and to lesser extent  small bowel not significantly changed Discussing risks and benefits and awaiting for patient decision for trial of pyridostigmine-cardiology consulted due to risk of cardiac side effects bradycardia-keeping on monitor, digoxin  to be not resumed keep K above 4 mag above 2-and has been stable now Increase activity OOB and activity as tolerated.  AKI on CKD stage II Hypovolemic hyponatremia Hypokalemia Metabolic acidosis Recent urinary obstruction: Patient has electrolyte balance in the setting of nausea vomiting and obstruction, volume depletion. Electrolytes overall stable, will replace potassium K-Dur 40 x1, and start 20meq bid  2/2 ogilvie syndrome Avoid nephrotoxic medication. Cont Flomax  Recent Labs    05/06/24 0610 05/09/24 0611 05/10/24 1206 05/11/24 0512 05/19/24 1550 05/19/24 1632 05/20/24 0739 05/21/24 0826 05/21/24 1931 05/22/24 0723 05/23/24 0528 05/24/24 0603  BUN 8 7* 9 7* 70* 67* 74* 44*  --  23 14 13   CREATININE 0.74 0.62 0.75 0.74 2.40* 1.96* 1.82* 0.95  --  0.62 0.56* 0.58*  CO2 30 30 26 30   --  22 21* 17*  --  21* 25 25  K 2.4* 2.4* 3.2* 3.0* 3.2* 3.4* 3.0* 3.0* 4.4 4.2 3.7 3.3*    CAD Ischemic cardiomyopathy: Mildly elevated troponin but no chest pain. Now back Plavix  as taking po  Chronic HFrEF Recent prolonged hospitalization complicated by cardiogenic shock with acute CHF with LAD stent stenosis 01/25/24: Last EF 20-25%, Global HKN on 02/21/24, proBNP at baseline 10,122-was 15,2454 in 06/26/24.   PTA on Jardiance , Aldactone ,recently digoxin  Entresto  was held due to softer blood pressure Cardiology following closel- not to resume digoxin  on d/c. ? reinitiation of GDMT here at some point.  Net IO Since Admission: 1,256.57 mL [05/24/24 1108]   Atrial  fibrillation on Eliquis : Was on heparin > switched to oral Eliquis  as taking po. Cont amiodarone , monitor in telemetry  History of PUD: Continue PPI p.o.  T2DM: Last A1c normal 5.5 last month, blood sugar is  stable  OSA: CPAP intolerance   Thyroid  nodules; renal lesions  Indeterminate thyroid  nodules and b/l renal lesions noted incidentally on CT in ED  Outpatient follow-up with thyroid  US  and renal protocol MRI recommended   Goals of care: Patient remains very high risk for readmission with complex medical comorbidities and at risk for acute decompensation, overall prognosis does not appear bright.He was seen by palliative care on his last admission-he is DNR limited with full scope of intervention.  Depression Anxiety: Mood is stable, continue SSRI, prn Atarax  Deconditioning debility: PT Orders: Active PT Follow up Rec: Home Health Pt (If Slow Progress, Might Need To Reconsider Snf)05/22/2024 1200    DVT prophylaxis: Heparin  Code Status:   Code Status: Limited: Do not attempt resuscitation (DNR) -DNR-LIMITED -Do Not Intubate/DNI  Family Communication: plan of care discussed with patient at bedside. Patient status is: Remains hospitalized because of severity of illness Level of care: Telemetry   Dispo: The patient is from: SNF [facility].  prior to SNF was living at  home w/ wife            Anticipated disposition: Home health versus SNF pending clinical improvement   Objective: Vitals last 24 hrs: Vitals:   05/24/24 0334 05/24/24 0500 05/24/24 0847 05/24/24 1005  BP: 99/64   97/62  Pulse: 73   78  Resp:    16  Temp: 97.6 F (36.4 C)   98.6 F (37 C)  TempSrc: Oral   Oral  SpO2: 94%  95% 97%  Weight:  74.6 kg    Height:        Physical Examination: General exam: AAOX3 HEENT:Oral mucosa moist, Ear/Nose WNL grossly Respiratory system: Bilaterally clear Cardiovascular system: S1 & S2 +, No JVD. Gastrointestinal system: Abdomen  soft distended nontender bowel sound tympanitic Nervous System: Alert, awake, nonfocal with generalized weakness Extremities: extremities warm, leg edema  none Skin: Warm, no rashes MSK: Normal muscle bulk,tone, power   Medications reviewed:   Scheduled Meds:  amiodarone   200 mg Oral Daily   apixaban   5 mg Oral BID   budesonide   0.25 mg Nebulization BID   clopidogrel   75 mg Oral Daily   cyanocobalamin   1,000 mcg Oral Daily   pantoprazole   40 mg Oral Daily   polyethylene glycol  17 g Oral BID   potassium chloride   20 mEq Oral BID   revefenacin   175 mcg Nebulization Daily   sertraline   50 mg Oral q morning   sodium chloride  flush  3 mL Intravenous Q12H   tamsulosin   0.4 mg Oral QHS   Continuous Infusions:   Diet: Diet Order             Diet Heart Room service appropriate? Yes; Fluid consistency: Thin  Diet effective now                   Data Reviewed: I have personally reviewed following labs and imaging studies ( see epic result tab) CBC: Recent Labs  Lab 05/19/24 1326 05/19/24 1550 05/20/24 0739 05/21/24 0826 05/22/24 0723 05/23/24 0528 05/24/24 0603  WBC 5.6  --  4.3 4.3 5.7 6.0 5.6  NEUTROABS 4.0  --   --   --   --   --   --   HGB 13.8   < >  12.3* 11.3* 12.6* 12.2* 12.4*  HCT 41.2   < > 36.2* 34.7* 38.7* 37.2* 36.8*  MCV 91.4  --  89.4 92.0 91.7 91.2 90.2  PLT 480*  --  377 305 350 356 314   < > = values in this interval not displayed.   CMP: Recent Labs  Lab 05/19/24 1326 05/19/24 1550 05/20/24 0739 05/21/24 0826 05/21/24 1931 05/22/24 0723 05/23/24 0528 05/24/24 0603  NA  --    < > 129* 134*  --  138 135 134*  K  --    < > 3.0* 3.0* 4.4 4.2 3.7 3.3*  CL  --    < > 93* 105  --  109 104 102  CO2  --    < > 21* 17*  --  21* 25 25  GLUCOSE  --    < > 79 48*  --  81 125* 96  BUN  --    < > 74* 44*  --  23 14 13   CREATININE  --    < > 1.82* 0.95  --  0.62 0.56* 0.58*  CALCIUM   --    < > 7.9* 7.7*  --  7.8* 8.2* 8.1*  MG 1.8  --  2.1  --   --   --   --  1.8   < > = values in this interval not displayed.   GFR: Estimated Creatinine Clearance: 82.9 mL/min (A) (by C-G formula based on SCr of 0.58 mg/dL (L)). Recent Labs  Lab 05/19/24 1632  AST 31  ALT 57*  ALKPHOS 106  BILITOT 0.4   PROT 5.6*  ALBUMIN 2.9*    Recent Labs  Lab 05/19/24 1632  LIPASE <10*   No results for input(s): AMMONIA in the last 168 hours. Coagulation Profile:  Recent Labs  Lab 05/19/24 1418  INR 2.0*   Unresulted Labs (From admission, onward)     Start     Ordered   05/24/24 0500  Basic metabolic panel with GFR  Daily,   R      05/23/24 1041   05/24/24 0500  CBC  Daily,   R      05/23/24 1041           Antimicrobials/Microbiology: Anti-infectives (From admission, onward)    None         Component Value Date/Time   SDES BLOOD RIGHT HAND 01/22/2024 1248   SPECREQUEST  01/22/2024 1248    BOTTLES DRAWN AEROBIC ONLY Blood Culture results may not be optimal due to an inadequate volume of blood received in culture bottles   CULT  01/22/2024 1248    NO GROWTH 5 DAYS Performed at [facility], [address], [place], [place] [address]    REPTSTATUS 01/27/2024 FINAL 01/22/2024 1248    Procedures:  Mennie LAMY, MD [facility] 05/24/2024, 11:08 AM   "

## 2024-05-24 NOTE — Progress Notes (Signed)
 [facility] Progress Note  Chief Complaint: Chronic colonic pseudoobstruction  History:  Chase Combs feels about the same as yesterday from the standpoint of his abdominal distention.  Still has copious liquid output in the fecal collection system. No cardiac or other clinical events since I saw him yesterday.  He denies chest pain or dyspnea   Objective:  Current Medications[1]     Vital signs in last 24 hrs: Vitals:   05/24/24 0847 05/24/24 1005  BP:  97/62  Pulse:  78  Resp:  16  Temp:  98.6 F (37 C)  SpO2: 95% 97%    Intake/Output Summary (Last 24 hours) at 05/24/2024 1040 Last data filed at 05/24/2024 0600 Gross per 24 hour  Intake 240 ml  Output --  Net 240 ml     Physical Exam Alert, conversational, no distress.  Chronically ill-appearing as before HEENT: sclera anicteric, oral mucosa without lesions Neck: supple, no thyromegaly, JVD or lymphadenopathy Cardiac: RRR without murmurs, S1S2 heard, no peripheral edema Pulm: clear to auscultation bilaterally, normal RR and effort noted Abdomen: soft, softly distended without tenderness, with active bowel sounds that are high-pitched consistent with his colonic pseudoobstruction. No guarding or palpable hepatosplenomegaly Skin; warm and dry, no jaundice  Recent Labs:     Latest Ref Rng & Units 05/24/2024    6:03 AM 05/23/2024    5:28 AM 05/22/2024    7:23 AM  CBC  WBC 4.0 - 10.5 K/uL 5.6  6.0  5.7   Hemoglobin 13.0 - 17.0 g/dL 87.5  87.7  87.3   Hematocrit 39.0 - 52.0 % 36.8  37.2  38.7   Platelets 150 - 400 K/uL 314  356  350     Recent Labs  Lab 05/19/24 1418  INR 2.0*      Latest Ref Rng & Units 05/24/2024    6:03 AM 05/23/2024    5:28 AM 05/22/2024    7:23 AM  CMP  Glucose 70 - 99 mg/dL 96  874  81   BUN 8 - 23 mg/dL 13  14  23    Creatinine 0.61 - 1.24 mg/dL 9.41  9.43  9.37   Sodium 135 - 145 mmol/L 134  135  138   Potassium 3.5 - 5.1 mmol/L 3.3  3.7  4.2   Chloride 98 - 111 mmol/L 102   104  109   CO2 22 - 32 mmol/L 25  25  21    Calcium  8.9 - 10.3 mg/dL 8.1  8.2  7.8      Radiologic studies:   Assessment & Plan  Assessment: 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  Naturally, we need to be cautious with the dosing.  Our cardiology consultant suggested 0.25 mg every 6 hours, though they may have meant 0.25 mg/kg every 6 hours (and presumably meant in the IV form).  Nevertheless, typical oral dosing for this indication is 30 mg by mouth twice daily.  I am checking with our pharmacist to see what strengths are available in oral form (either by tablet or suspension) to see if it might even be feasible to start with 15 mg twice daily. This patient's nurse was in the room when I saw him administering some of his morning medicines, so I also went over the entire plan with her and  she was involved in a chat message stream I have along with the [address]. pharmacist.  I told her about the medicine we will be trying its potential side effects, and that we need to closely watch his heart monitor throughout the day for possible bradycardia or any other concerns.  Will place orders for treatment after final communications with the pharmacy team, and then we will continue to follow him closely.  I spent total of 35 minutes in both face-to-face (patient exam and interview) and non-face-to-face ( chart review, care coordination, documentation)  activities, excluding procedures performed, for the visit on the date of this encounter.     Chase Combs Office: [phone]     [1]  Current Facility-Administered Medications:    amiodarone  (PACERONE ) tablet 200 mg, 200 mg, Oral, Daily, Kc, Ramesh, MD, 200 mg at 05/24/24 1017   apixaban  (ELIQUIS )  tablet 5 mg, 5 mg, Oral, BID, Dang, Thuy D, RPH, 5 mg at 05/24/24 1018   budesonide  (PULMICORT ) nebulizer solution 0.25 mg, 0.25 mg, Nebulization, BID, Kc, Ramesh, MD, 0.25 mg at 05/24/24 0847   clopidogrel  (PLAVIX ) tablet 75 mg, 75 mg, Oral, Daily, Kc, Ramesh, MD, 75 mg at 05/24/24 1018   cyanocobalamin  (VITAMIN B12) tablet 1,000 mcg, 1,000 mcg, Oral, Daily, Kc, Ramesh, MD, 1,000 mcg at 05/24/24 1018   fentaNYL  (SUBLIMAZE ) injection 12.5-50 mcg, 12.5-50 mcg, Intravenous, Q2H PRN, Opyd, Timothy S, MD, 50 mcg at 05/23/24 1537   hydrOXYzine (ATARAX) tablet 25 mg, 25 mg, Oral, QHS PRN, Kc, Ramesh, MD   ipratropium-albuterol  (DUONEB) 0.5-2.5 (3) MG/3ML nebulizer solution 3 mL, 3 mL, Nebulization, Q6H PRN, Opyd, Timothy S, MD   liver oil-zinc  oxide (DESITIN) 40 % ointment, , Topical, QID PRN, Kc, Ramesh, MD   methylnaltrexone  (RELISTOR ) injection 8 mg, 8 mg, Subcutaneous, Q24H, Esterwood, Amy S, PA-C, 8 mg at 05/23/24 9167   pantoprazole  (PROTONIX ) EC tablet 40 mg, 40 mg, Oral, Daily, Kc, Ramesh, MD, 40 mg at 05/24/24 1018   polyethylene glycol (MIRALAX  / GLYCOLAX ) packet 17 g, 17 g, Oral, BID, Esterwood, Amy S, PA-C, 17 g at 05/24/24 1018   potassium chloride  SA (KLOR-CON  M) CR tablet 20 mEq, 20 mEq, Oral, BID, Kc, Ramesh, MD, 20 mEq at 05/24/24 1018   prochlorperazine  (COMPAZINE ) injection 5 mg, 5 mg, Intravenous, Q6H PRN, Opyd, Timothy S, MD, 5 mg at 05/19/24 2236   revefenacin  (YUPELRI ) nebulizer solution 175 mcg, 175 mcg, Nebulization, Daily, Kc, Ramesh, MD, 175 mcg at 05/24/24 0847   sertraline  (ZOLOFT ) tablet 50 mg, 50 mg, Oral, q morning, Kc, Ramesh, MD, 50 mg at 05/24/24 1017   sodium chloride  flush (NS) 0.9 % injection 3 mL, 3 mL, Intravenous, Q12H, Opyd, Timothy S, MD, 3 mL at 05/24/24 1018   tamsulosin  (FLOMAX ) capsule 0.4 mg, 0.4 mg, Oral, QHS, Kc, Ramesh, MD, 0.4 mg at 05/23/24 2107

## 2024-05-24 NOTE — Progress Notes (Signed)
 Add pyridostigmine 15mg  suspension BID x72hrs per Dr. Legrand.   Sergio Batch, PharmD, BCIDP, AAHIVP, CPP Infectious Disease Pharmacist 05/24/2024 11:43 AM

## 2024-05-25 DIAGNOSIS — K5981 Ogilvie syndrome: Secondary | ICD-10-CM | POA: Diagnosis not present

## 2024-05-25 LAB — BASIC METABOLIC PANEL WITH GFR
Anion gap: 7 (ref 5–15)
BUN: 12 mg/dL (ref 8–23)
CO2: 24 mmol/L (ref 22–32)
Calcium: 8.2 mg/dL — ABNORMAL LOW (ref 8.9–10.3)
Chloride: 103 mmol/L (ref 98–111)
Creatinine, Ser: 0.63 mg/dL (ref 0.61–1.24)
GFR, Estimated: >60 mL/min
Glucose, Bld: 107 mg/dL — ABNORMAL HIGH (ref 70–99)
Potassium: 4.2 mmol/L (ref 3.5–5.1)
Sodium: 134 mmol/L — ABNORMAL LOW (ref 135–145)

## 2024-05-25 LAB — CBC
HCT: 38.4 % — ABNORMAL LOW (ref 39.0–52.0)
Hemoglobin: 13 g/dL (ref 13.0–17.0)
MCH: 30.2 pg (ref 26.0–34.0)
MCHC: 33.9 g/dL (ref 30.0–36.0)
MCV: 89.3 fL (ref 80.0–100.0)
Platelets: 329 K/uL (ref 150–400)
RBC: 4.3 MIL/uL (ref 4.22–5.81)
RDW: 17.5 % — ABNORMAL HIGH (ref 11.5–15.5)
WBC: 12.8 K/uL — ABNORMAL HIGH (ref 4.0–10.5)
nRBC: 0 % (ref 0.0–0.2)

## 2024-05-25 MED ORDER — ORAL CARE MOUTH RINSE: 15.0000 mL | OROMUCOSAL | Status: DC | PRN

## 2024-05-25 MED ORDER — ENSURE PLUS HIGH PROTEIN PO LIQD
237.0000 mL | Freq: Two times a day (BID) | ORAL | Status: DC
  Administered 2024-05-25 – 2024-05-31 (×10): 237 mL via ORAL

## 2024-05-25 NOTE — Progress Notes (Incomplete)
 Patient ID: Chase Combs, male   DOB: 04-Apr-1946, 79 y.o.   MRN: [id number]    Progress Note   Subjective  Day # 6 CC;  Labs today-WBC 12.8/hemoglobin 13/hematocrit 38.4 Sodium 134/potassium 4.2/BUN 12/creatinine 0.63   Objective   Vital signs in last 24 hours: Temp:  [97.4 F (36.3 C)-98.6 F (37 C)] 97.4 F (36.3 C) (01/01 9367) Pulse Rate:  [78-81] 81 (01/01 0632) Resp:  [14-16] 14 (12/31 2334) BP: (91-110)/(62-75) 91/75 (01/01 9367) SpO2:  [96 %-99 %] 96 % (01/01 9367) Weight:  [75.4 kg] 75.4 kg (01/01 0500) Last BM Date : 05/25/24 General:    white male in NAD Heart:  Regular rate and rhythm; no murmurs Lungs: Respirations even and unlabored, lungs CTA bilaterally Abdomen:  Soft, nontender and nondistended. Normal bowel sounds. Extremities:  Without edema. Neurologic:  Alert and oriented,  grossly normal neurologically. Psych:  Cooperative. Normal mood and affect.  Intake/Output from previous day: 12/31 0701 - 01/01 0700 In: 163.7 [IV Piggyback:163.7] Out: 400 [Urine:400] Intake/Output this shift: No intake/output data recorded.  Lab Results: Recent Labs    05/23/24 0528 05/24/24 0603 05/25/24 0502  WBC 6.0 5.6 12.8*  HGB 12.2* 12.4* 13.0  HCT 37.2* 36.8* 38.4*  PLT 356 314 329   BMET Recent Labs    05/23/24 0528 05/24/24 0603 05/25/24 0502  NA 135 134* 134*  K 3.7 3.3* 4.2  CL 104 102 103  CO2 25 25 24   GLUCOSE 125* 96 107*  BUN 14 13 12   CREATININE 0.56* 0.58* 0.63  CALCIUM  8.2* 8.1* 8.2*   LFT No results for input(s): PROT, ALBUMIN, AST, ALT, ALKPHOS, BILITOT, BILIDIR, IBILI in the last 72 hours. PT/INR No results for input(s): LABPROT, INR in the last 72 hours.       Assessment / Plan:         Principal Problem:   Colonic pseudoobstruction Active Problems:   Coronary artery disease involving native coronary artery of native heart without angina pectoris   Hypokalemia   Ogilvie syndrome   PUD (peptic  ulcer disease)   Acute kidney injury   Paroxysmal atrial flutter (HCC)   Chronic combined systolic and diastolic congestive heart failure (HCC)   Thyroid  nodule   Renal lesion   Abdominal distention     LOS: 6 days   Elliott Lasecki PA-C 05/25/2024, 8:48 AM

## 2024-05-25 NOTE — Progress Notes (Signed)
 Physical Therapy Treatment Patient Details Name: Chase Combs MRN: [id number] DOB: 1945/08/10 Today's Date: 05/25/2024   History of Present Illness Pt is a 79 y.o male admitted from [facility] for N/V/D. 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 Symptomatic orthostatic hypotension 12/31 during PT session. PMH: NSTEMI, CAD, heart failure, a fib, HTN, HLD, DMII, CKD, OSA, prostate cancer, Ogilvie syndrome    PT Comments  Pt received in supine, agreeable to therapy session with encouragement. Pt still with c/o dizziness with postural changes today, and given severity of his orthostatic hypotension yesterday, defer ambulation until pt has BLE TED hose or compression socks available to wear; pt encouraged to request his spouse bring in his compression socks from home for next session. VSS when checked sitting EOB, defer standing BP for pt safety as he his symptomatic with standing >20-30 seconds at a time. Pt needing up to CGA for safety performing functional mobility tasks. Worked on seated/standing BLE exercises and reciprocal transfers for strengthening, hopeful to progress ambulation next session with chair follow for safety if BP and symptoms more stable. Pt continues to benefit from PT services to progress toward functional mobility goals, continue to recommend HHPT as long as pt/spouse agreeable that they can manage at his current level of functioning.     If plan is discharge home, recommend the following: A lot of help with walking and/or transfers;A lot of help with bathing/dressing/bathroom;Assistance with cooking/housework;Assist for transportation;Help with stairs or ramp for entrance   Can travel by private vehicle        Equipment Recommendations  None recommended by PT (well-equipped)    Recommendations for Other Services       Precautions / Restrictions Precautions Precautions: Fall;Other (comment) Recall of Precautions/Restrictions:  Intact Precaution/Restrictions Comments: watch BP,  rectal pouch Restrictions Weight Bearing Restrictions Per Provider Order: No     Mobility  Bed Mobility Overal bed mobility: Needs Assistance Bed Mobility: Supine to Sit, Rolling, Sit to Supine Rolling: Used rails, Supervision   Supine to sit: Supervision, HOB elevated, Used rails Sit to supine: Supervision, HOB elevated, Used rails   General bed mobility comments: No physical assist needed, cues for lines/safety    Transfers Overall transfer level: Needs assistance Equipment used: Rolling walker (2 wheels) Transfers: Sit to/from Stand, Bed to chair/wheelchair/BSC Sit to Stand: Contact guard assist   Step pivot transfers: Contact guard assist       General transfer comment: from EOB<>RW and sidesteps x2 at EOB toward [facility] with RW and CGA for safety. No buckling or LOB but pt dizzy, and recent orthostatic hypotension (no TED hose in room yet) so defer ambulation away from bed for pt safety.    Ambulation/Gait Ambulation/Gait assistance: Contact guard assist, +2 safety/equipment, Min assist   Assistive device: Rolling walker (2 wheels)       Pre-gait activities: standing hip flexion ~10 reps at RW, CGA for safety General Gait Details: defer due to pt dizziness (no TED hose in room yet) and rectal pouch leaking, will need chair follow next session if BP stable for ambulation   Stairs             Wheelchair Mobility     Tilt Bed    Modified Rankin (Stroke Patients Only)       Balance Overall balance assessment: Mild deficits observed, not formally tested  Communication Communication Communication: No apparent difficulties  Cognition Arousal: Alert Behavior During Therapy: WFL for tasks assessed/performed   PT - Cognitive impairments: No apparent impairments                         Following commands: Intact      Cueing Cueing  Techniques: Verbal cues, Gestural cues  Exercises Other Exercises Other Exercises: reciprocal STS x 5 reps x2 sets with BUE support, seated break between trials. Other Exercises: seated BLE AROM: LAQ x10 reps ea Other Exercises: standing BLE AROM: hip flexion x10 reps ea    General Comments General comments (skin integrity, edema, etc.): sitting BP 99/70 (80) HR 97-103 bpm per tele (reading Vtach per tele but pt had been moving, central tele called by PTA but they report this was artifact and not true Vtach). SpO2 98% on RA sitting EOB.      Pertinent Vitals/Pain Pain Assessment Pain Assessment: Faces Faces Pain Scale: No hurt Pain Intervention(s): Monitored during session, Repositioned    Home Living                          Prior Function            PT Goals (current goals can now be found in the care plan section) Acute Rehab PT Goals Patient Stated Goal: get stronger and return home PT Goal Formulation: With patient Time For Goal Achievement: 06/05/24 Progress towards PT goals: Progressing toward goals    Frequency    Min 2X/week      PT Plan      Co-evaluation              AM-PAC PT 6 Clicks Mobility   Outcome Measure  Help needed turning from your back to your side while in a flat bed without using bedrails?: A Little Help needed moving from lying on your back to sitting on the side of a flat bed without using bedrails?: A Little Help needed moving to and from a bed to a chair (including a wheelchair)?: A Little Help needed standing up from a chair using your arms (e.g., wheelchair or bedside chair)?: A Little Help needed to walk in hospital room?: Total (due to low BP) Help needed climbing 3-5 steps with a railing? : Total 6 Click Score: 14    End of Session Equipment Utilized During Treatment: Gait belt Activity Tolerance: Patient tolerated treatment well;Treatment limited secondary to medical complications (Comment);Other  (comment);Patient limited by fatigue (Dizzy in standing and leaking flexi-seal) Patient left: with call bell/phone within reach;Other (comment);in bed;with bed alarm set (pt defers OOB to chair today) Nurse Communication: Mobility status;Precautions;Other (comment) (pt needs TED hose due to symptomatic drop in BP) PT Visit Diagnosis: Other abnormalities of gait and mobility (R26.89);Muscle weakness (generalized) (M62.81)     Time: 8497-8471 PT Time Calculation (min) (ACUTE ONLY): 26 min  Charges:    $Therapeutic Exercise: 8-22 mins $Therapeutic Activity: 8-22 mins PT General Charges $$ ACUTE PT VISIT: 1 Visit                     Caymen Dubray P., PTA Acute Rehabilitation Services Secure Chat Preferred 9a-5:30pm Office: [phone]    [name] [facility] 05/25/2024, 3:41 PM

## 2024-05-25 NOTE — Plan of Care (Signed)
  Problem: Clinical Measurements: Goal: Diagnostic test results will improve Outcome: Progressing   Problem: Nutrition: Goal: Adequate nutrition will be maintained Outcome: Progressing   Problem: Elimination: Goal: Will not experience complications related to bowel motility Outcome: Progressing   Problem: Pain Managment: Goal: General experience of comfort will improve and/or be controlled Outcome: Progressing

## 2024-05-25 NOTE — Progress Notes (Signed)
 " Progress Note    Chase Combs   FMW:[id number]  DOB: 1945-12-05  DOA: 05/19/2024     6 PCP: Wonda Worth SQUIBB, PA  Initial CC: Abdominal distention  Hospital Course: Chase Combs is a 79 y.o. 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  He has not been able to keep oral intake and has chronic abdominal distention and nausea vomiting diarrhea. Recent prolonged admit 11/28-12/18- 2/2 recurrent Ogilvie's syndrome, severe malnutrition profound diarrhea> and discharged to SNF. In ED  afebrile and  spo2 mid 90s on  RA,  mild tachycardia and stable blood pressure. Labs-sodium 128, BUN 67, creatinine 1.96, normal WBC, troponin 49, and proBNP 10,122. CT is concerning for distal large bowel obstruction with transition point at the proximal sigmoid colon. GI was consulted by the ED, s/p ivf 500 cc LR and Reglan , and  NG tube and rectal tube ordered. Managed with conservative treatment, NG clamped and subsequently removed 12/29  Assessment and plan:  Recurrent Pseudo-obstruction of colon Ogilvie syndrome Recurrent abdominal pain Nausea/vomiting S/P NG tube decompression, overall with some clinical improvement and NG tube removed 12/29 and diet started Patient was placed on Relistor  x 3 doses, rectal tube to be continued.  GI following X-ray abdomen 12/30 shows marked gaseous distention of colon and to lesser extent small bowel not significantly changed Discussing risks and benefits and awaiting for patient decision for trial of pyridostigmine-cardiology consulted due to risk of cardiac side effects bradycardia-keeping on monitor, digoxin  to be not resumed keep K above 4 mag above 2-and has been stable now Increase activity OOB and  activity as tolerated. - Continue monitoring response on pyridostigmine  AKI on CKD stage II Hypovolemic hyponatremia Hypokalemia Metabolic acidosis Recent urinary obstruction: Patient has electrolyte balance in the setting of nausea vomiting and obstruction, volume depletion. Electrolytes overall stable, will replace potassium K-Dur 40 x1, and start 20meq bid  2/2 ogilvie syndrome Avoid nephrotoxic medication. Cont Flomax   CAD Ischemic cardiomyopathy Mildly elevated troponin but no chest pain - Continue Plavix   Chronic HFrEF Recent prolonged hospitalization complicated by cardiogenic shock with acute CHF with LAD stent stenosis 01/25/24: Last EF 20-25%, Global HKN on 02/21/24, proBNP at baseline 10,122-was 15,2454 in 06/26/24.   PTA on Jardiance , Aldactone ,recently digoxin  Entresto  was held due to softer blood pressure Cardiology following closel- not to resume digoxin  on d/c. ? reinitiation of GDMT here at some point.   Atrial fibrillation on Eliquis  Was on heparin > switched to oral Eliquis  as taking po. Cont amiodarone , monitor in telemetry  History of PUD Continue PPI p.o  T2DM Last A1c normal 5.5 last month, blood sugar is stable  OSA CPAP intolerance   Thyroid  nodules; renal lesions  Indeterminate thyroid  nodules and b/l renal lesions noted incidentally on CT in ED  Outpatient follow-up with thyroid  US  and renal protocol MRI recommended   Goals of care Patient remains very high risk for readmission with complex medical comorbidities and at risk for acute decompensation, overall prognosis does not appear bright.He was seen by palliative care on his last admission-he is DNR limited with full scope of intervention.  Depression Anxiety Mood is stable, continue SSRI, prn Atarax  Deconditioning PT Orders: Active PT Follow up Rec: Home Health Pt (If Slow Progress, Might Need To  Reconsider Snf)05/22/2024 1200   Interval History:  No events overnight.  Has been started on  Mestinon for further treatment and tolerating well but no significant effect yet.   Antimicrobials:   DVT prophylaxis:   apixaban  (ELIQUIS ) tablet 5 mg   Code Status:   Code Status: Limited: Do not attempt resuscitation (DNR) -DNR-LIMITED -Do Not Intubate/DNI   Mobility Assessment (Last 72 Hours)     Mobility Assessment     Row Name 05/25/24 0800 05/24/24 2045 05/24/24 1500 05/24/24 1026 05/24/24 1021   Does the patient have exclusion criteria? No- Perform mobility assessment No- Perform mobility assessment -- No- Perform mobility assessment No- Perform mobility assessment   What is the highest level of mobility based on the mobility assessment? Level 4 (Ambulates with assistance) - Balance while stepping forward/back - Complete Level 2 (Chairfast) - Balance while sitting on edge of bed and cannot stand Level 3 (Stands with assistance) - Balance while standing  and cannot march in place Level 2 (Chairfast) - Balance while sitting on edge of bed and cannot stand Level 2 (Chairfast) - Balance while sitting on edge of bed and cannot stand   Is the above level different from baseline mobility prior to current illness? Yes - Recommend PT order Yes - Recommend PT order -- Yes - Recommend PT order Yes - Recommend PT order    Row Name 05/23/24 2045 05/23/24 1349 05/23/24 0841 05/23/24 0840 05/22/24 2115   Does the patient have exclusion criteria? No- Perform mobility assessment -- No- Perform mobility assessment No- Perform mobility assessment No- Perform mobility assessment   What is the highest level of mobility based on the mobility assessment? Level 2 (Chairfast) - Balance while sitting on edge of bed and cannot stand Level 4 (Ambulates with assistance) - Balance while stepping forward/back - Complete Level 2 (Chairfast) - Balance while sitting on edge of bed and cannot stand Level 2 (Chairfast) - Balance while sitting on edge of bed and cannot stand Level 2 (Chairfast) - Balance while sitting on  edge of bed and cannot stand   Is the above level different from baseline mobility prior to current illness? Yes - Recommend PT order -- Yes - Recommend PT order Yes - Recommend PT order Yes - Recommend PT order      Diet: Diet Orders (From admission, onward)     Start     Ordered   05/23/24 1010  Diet Heart Room service appropriate? Yes; Fluid consistency: Thin  Diet effective now       Comments: soft  Question Answer Comment  Room service appropriate? Yes   Fluid consistency: Thin      05/23/24 1009            Barriers to discharge: none Disposition Plan:  Home HH orders placed: n/a Status is: Inpt  Objective: Blood pressure 106/70, pulse 79, temperature (!) 97.4 F (36.3 C), temperature source Oral, resp. rate 18, height 6' (1.829 m), weight 75.4 kg, SpO2 93%.  Examination:  Physical Exam Constitutional:      General: He is not in acute distress.    Appearance: Normal appearance.  HENT:     Head: Normocephalic and atraumatic.     Mouth/Throat:     Mouth: Mucous membranes are moist.  Eyes:     Extraocular Movements: Extraocular movements intact.  Cardiovascular:     Rate and Rhythm: Normal rate and regular rhythm.  Pulmonary:     Effort: Pulmonary effort is normal. No respiratory distress.  Breath sounds: Normal breath sounds. No wheezing.  Abdominal:     General: Bowel sounds are normal. There is distension (very noticable).     Palpations: Abdomen is soft.     Tenderness: There is no abdominal tenderness.     Comments: Uncomfortable from distention but no overt pain  Musculoskeletal:        General: Normal range of motion.     Cervical back: Normal range of motion and neck supple.  Skin:    General: Skin is warm and dry.  Neurological:     General: No focal deficit present.     Mental Status: He is alert.  Psychiatric:        Mood and Affect: Mood normal.        Behavior: Behavior normal.      Consultants:  GI Cardiology  Procedures:     Data Reviewed: Results for orders placed or performed during the hospital encounter of 05/19/24 (from the past 24 hours)  Basic metabolic panel with GFR     Status: Abnormal   Collection Time: 05/25/24  5:02 AM  Result Value Ref Range   Sodium 134 (L) 135 - 145 mmol/L   Potassium 4.2 3.5 - 5.1 mmol/L   Chloride 103 98 - 111 mmol/L   CO2 24 22 - 32 mmol/L   Glucose, Bld 107 (H) 70 - 99 mg/dL   BUN 12 8 - 23 mg/dL   Creatinine, Ser 9.36 0.61 - 1.24 mg/dL   Calcium  8.2 (L) 8.9 - 10.3 mg/dL   GFR, Estimated >39 >39 mL/min   Anion gap 7 5 - 15  CBC     Status: Abnormal   Collection Time: 05/25/24  5:02 AM  Result Value Ref Range   WBC 12.8 (H) 4.0 - 10.5 K/uL   RBC 4.30 4.22 - 5.81 MIL/uL   Hemoglobin 13.0 13.0 - 17.0 g/dL   HCT 61.5 (L) 60.9 - 47.9 %   MCV 89.3 80.0 - 100.0 fL   MCH 30.2 26.0 - 34.0 pg   MCHC 33.9 30.0 - 36.0 g/dL   RDW 82.4 (H) 88.4 - 84.4 %   Platelets 329 150 - 400 K/uL   nRBC 0.0 0.0 - 0.2 %    I have reviewed pertinent nursing notes, vitals, labs, and images as necessary. I have ordered labwork to follow up on as indicated.  I have reviewed the last notes from staff over past 24 hours. I have discussed patient's care plan and test results with nursing staff, CM/SW, and other staff as appropriate.  Old records reviewed in assessment of this patient  Time spent: Greater than 50% of the 55 minute visit was spent in counseling/coordination of care for the patient as laid out in the A&P.   LOS: 6 days   Alm Apo, MD [facility] 05/25/2024, 2:09 PM "

## 2024-05-25 NOTE — Progress Notes (Signed)
 [facility] Progress Note  Chief Complaint: Chronic colonic pseudoobstruction  History:  Chase Combs feels about the same today.  He is tolerating the low-dose Mestinon so far (has had 3 doses).  He reports having had some brief lower abdominal cramps last evening but otherwise no apparent effects or side effects.  He thinks his abdomen feels about the same level of distention.  He has not had bradycardia on the monitor (and I looked at his monitor earlier), denies sweating, salivation or blurred vision. 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  Objective:  Current Medications[1]     Vital signs in last 24 hrs: Vitals:   05/25/24 0748 05/25/24 1146  BP:  106/70  Pulse:  79  Resp:  18  Temp:    SpO2: 96% 93%    Intake/Output Summary (Last 24 hours) at 05/25/2024 1155 Last data filed at 05/25/2024 0800 Gross per 24 hour  Intake 403.68 ml  Output 400 ml  Net 3.68 ml     Physical Exam  Chronically ill-appearing man, alert and in good spirits Cardiac: RRR without murmurs, S1S2 heard, no peripheral edema Pulm: clear to auscultation bilaterally, normal RR and effort noted Abdomen: soft, softly distended and tympanitic with high-pitched bowel sounds, similar to my exam yesterday.  No focal tenderness Skin; warm and dry, no jaundice  Recent Labs:     Latest Ref Rng & Units 05/25/2024    5:02 AM 05/24/2024    6:03 AM 05/23/2024    5:28 AM  CBC  WBC 4.0 - 10.5 K/uL 12.8  5.6  6.0   Hemoglobin 13.0 - 17.0 g/dL 86.9  87.5  87.7   Hematocrit 39.0 - 52.0 % 38.4  36.8  37.2   Platelets 150 - 400 K/uL 329  314  356     Recent Labs  Lab 05/19/24 1418  INR 2.0*      Latest Ref Rng & Units 05/25/2024    5:02 AM 05/24/2024    6:03 AM 05/23/2024    5:28 AM  CMP  Glucose 70 - 99 mg/dL 892  96  874   BUN 8 - 23 mg/dL 12  13  14    Creatinine 0.61 - 1.24 mg/dL 9.36  9.41  9.43   Sodium 135 - 145 mmol/L 134  134  135   Potassium 3.5 - 5.1 mmol/L 4.2  3.3   3.7   Chloride 98 - 111 mmol/L 103  102  104   CO2 22 - 32 mmol/L 24  25  25    Calcium  8.9 - 10.3 mg/dL 8.2  8.1  8.2      Radiologic studies:   Assessment & Plan  Assessment:  Chronic colonic pseudoobstruction -insufficient control with Relistor  and MiraLAX .  Started low-dose pyridostigmine yesterday (15 mg solution by mouth twice daily)  No apparent adverse effect so far, although also little if any improvement on exam.  My advice is to continue this dosing until tomorrow.  If still no adverse events and not much improvement, we will increase the dose to 30 mg twice daily.   Victory LITTIE Brand III Office: [phone]     [1]  Current Facility-Administered Medications:    amiodarone  (PACERONE ) tablet 200 mg, 200 mg, Oral, Daily, Kc, Ramesh, MD, 200 mg at 05/25/24 0918   apixaban  (ELIQUIS ) tablet 5 mg, 5 mg, Oral, BID, Dang, Thuy D, RPH, 5 mg at 05/25/24 9080   budesonide  (PULMICORT ) nebulizer solution 0.25 mg, 0.25 mg, Nebulization,  BID, Christobal [place], MD, 0.25 mg at 05/25/24 0748   clopidogrel  (PLAVIX ) tablet 75 mg, 75 mg, Oral, Daily, Kc, Ramesh, MD, 75 mg at 05/25/24 9081   cyanocobalamin  (VITAMIN B12) tablet 1,000 mcg, 1,000 mcg, Oral, Daily, Kc, Ramesh, MD, 1,000 mcg at 05/25/24 9081   feeding supplement (ENSURE PLUS HIGH PROTEIN) liquid 237 mL, 237 mL, Oral, BID BM, Patsy Lenis, MD   fentaNYL  (SUBLIMAZE ) injection 12.5-50 mcg, 12.5-50 mcg, Intravenous, Q2H PRN, Opyd, Timothy S, MD, 12.5 mcg at 05/25/24 0507   hydrOXYzine (ATARAX) tablet 25 mg, 25 mg, Oral, QHS PRN, Kc, Ramesh, MD   ipratropium-albuterol  (DUONEB) 0.5-2.5 (3) MG/3ML nebulizer solution 3 mL, 3 mL, Nebulization, Q6H PRN, Opyd, Timothy S, MD   liver oil-zinc  oxide (DESITIN) 40 % ointment, , Topical, QID PRN, Kc, Ramesh, MD   Oral care mouth rinse, 15 mL, Mouth Rinse, PRN, Patsy Lenis, MD   pantoprazole  (PROTONIX ) EC tablet 40 mg, 40 mg, Oral, Daily, Kc, Ramesh, MD, 40 mg at 05/25/24 0918   polyethylene glycol  (MIRALAX  / GLYCOLAX ) packet 17 g, 17 g, Oral, BID, Ester[place], Amy S, PA-C, 17 g at 05/25/24 9081   potassium chloride  SA (KLOR-CON  M) CR tablet 20 mEq, 20 mEq, Oral, BID, Kc, Ramesh, MD, 20 mEq at 05/25/24 9081   prochlorperazine  (COMPAZINE ) injection 5 mg, 5 mg, Intravenous, Q6H PRN, Opyd, Timothy S, MD, 5 mg at 05/24/24 1549   pyridostigmine (MESTINON) 60 MG/5ML solution 15.6 mg, 15.6 mg, Oral, BID, Pham, Minh Q, RPH-CPP, 15.6 mg at 05/25/24 0919   revefenacin  (YUPELRI ) nebulizer solution 175 mcg, 175 mcg, Nebulization, Daily, Kc, Ramesh, MD, 175 mcg at 05/25/24 0748   sertraline  (ZOLOFT ) tablet 50 mg, 50 mg, Oral, q morning, Kc, Ramesh, MD, 50 mg at 05/25/24 0918   sodium chloride  flush (NS) 0.9 % injection 3 mL, 3 mL, Intravenous, Q12H, Opyd, Timothy S, MD, 3 mL at 05/25/24 0919   tamsulosin  (FLOMAX ) capsule 0.4 mg, 0.4 mg, Oral, QHS, Kc, Ramesh, MD, 0.4 mg at 05/24/24 2147

## 2024-05-26 DIAGNOSIS — K5981 Ogilvie syndrome: Secondary | ICD-10-CM | POA: Diagnosis not present

## 2024-05-26 LAB — MAGNESIUM: Magnesium: 1.7 mg/dL (ref 1.7–2.4)

## 2024-05-26 LAB — BASIC METABOLIC PANEL WITH GFR
Anion gap: 9 (ref 5–15)
BUN: 15 mg/dL (ref 8–23)
CO2: 23 mmol/L (ref 22–32)
Calcium: 8.6 mg/dL — ABNORMAL LOW (ref 8.9–10.3)
Chloride: 102 mmol/L (ref 98–111)
Creatinine, Ser: 0.73 mg/dL (ref 0.61–1.24)
GFR, Estimated: >60 mL/min
Glucose, Bld: 136 mg/dL — ABNORMAL HIGH (ref 70–99)
Potassium: 4.2 mmol/L (ref 3.5–5.1)
Sodium: 133 mmol/L — ABNORMAL LOW (ref 135–145)

## 2024-05-26 MED ORDER — MAGNESIUM SULFATE 2 GM/50ML IV SOLN
2.0000 g | Freq: Once | INTRAVENOUS | Status: AC
  Administered 2024-05-26: 2 g via INTRAVENOUS
  Filled 2024-05-26: qty 50

## 2024-05-26 MED ORDER — PYRIDOSTIGMINE BROMIDE 60 MG/5ML PO SOLN
30.0000 mg | Freq: Two times a day (BID) | ORAL | Status: AC
  Administered 2024-05-26 – 2024-05-28 (×5): 30 mg via ORAL
  Filled 2024-05-26 (×5): qty 2.5

## 2024-05-26 MED ORDER — PYRIDOSTIGMINE BROMIDE 60 MG/5ML PO SOLN
15.0000 mg | Freq: Once | ORAL | Status: AC
  Administered 2024-05-26: 15.6 mg via ORAL
  Filled 2024-05-26: qty 1.3

## 2024-05-26 NOTE — Consult Note (Deleted)
 "  Progress Note  Patient Name: Chase Combs Date of Encounter: 05/26/2024 [facility] Cardiologist: Alm Clay, MD   Interval Summary   Patient was sitting up comfortably in bed during interview.  Reported that he had a cough that started last night.  Reported having a brief episode of shortness of breath this morning.  Denies any chest pain, or lower extremity edema.  Vital Signs Vitals:   05/25/24 1840 05/25/24 2025 05/25/24 2202 05/26/24 0430  BP: 102/63  101/70 (!) 90/58  Pulse: 81  81 81  Resp: 18  17 14   Temp:   (!) 97.5 F (36.4 C) 97.7 F (36.5 C)  TempSrc:      SpO2: 98% 99% 97% 97%  Weight:      Height:        Intake/Output Summary (Last 24 hours) at 05/26/2024 0844 Last data filed at 05/26/2024 9391 Gross per 24 hour  Intake 360 ml  Output 865 ml  Net -505 ml      05/25/2024    5:00 AM 05/24/2024    5:00 AM 05/23/2024    5:00 AM  Last 3 Weights  Weight (lbs) 166 lb 3.6 oz 164 lb 7.4 oz 165 lb 2 oz  Weight (kg) 75.4 kg 74.6 kg 74.9 kg      Telemetry/ECG  Normal sinus rhythm with heart rates in the 80s and 90s.  Wide QRS- Personally Reviewed  Physical Exam  GEN: No acute distress.  Alert and oriented on room air. Neck: No JVD Cardiac: RRR, no murmurs, rubs, or gallops.  Respiratory: Clear to auscultation bilaterally. GI: Soft, nontender, slightly distended. MS: No edema  Assessment & Plan  NENO HOHENSEE is a 79 y.o. male with PMH of recurrent colonic pseudo-obstruction/Ogilvie syndrome, CKD stage II, chronic biventricular HFrEF due to ischemic cardiomyopathy, NSTEMI s/p DES to LAD (01/17/24), remote h/o STEMI s/p PCI with BMS to pRCA (2010), s/p PCI with BMS to pRI (2006), paroxysmal atrial flutter, HTN, HLD, diabetes mellitus type 2, OSA, PUD, prostate cancer who was seen for the evaluation of pyridostigmine use.     Recurrent pseudoobstruction of colon Has had recurrent abdominal pain, nausea, vomiting.  Previously had NG tube decompression.   Was recently placed on a diet and is tolerating. It was felt like the patient would tolerate pyridostigmine. Patient was started on pyridostigmine on 05/24/2024 and plans to be stopped on 05/27/2024. Management per primary     Persistent atrial fibrillation The patient was recently placed on a diet.  Because of this IV heparin  was stopped and was started on Eliquis .  Patient is currently in sinus rhythm with heart rates in the 80s and 90s. Continue Eliquis  5mg  BID Continue Amiodarone  200mg  daily.     Chronic biventricular HFrEF Prior TEE on 01/2024 showed a reduced LVEF of 20 to 25%, global hypokinesis, moderately reduced RV systolic function, moderately dilated left atrium, mild MR, and mild aortic valve calcification.  Patient's weight is stable at 75 kg's.  Patient appears euvolemic on exam GDMT -  Limited by hypotension.  At about 4:30 AM this morning BP was 90/58. May consider starting Jardiance  10 mg daily.     CAD s/p DES to LAD on 12/2023.  Also has a remote history of multiple PCI's. Continue Plavix  75mg  daily     Otherwise management per primary     For questions or updates, please contact [facility] Please consult [email or web address] for contact info under  Signed, Morse Clause, PA-C   "

## 2024-05-26 NOTE — Progress Notes (Addendum)
 "  Progress Note  Patient Name: Chase Combs Date of Encounter: 05/26/2024 [facility] Cardiologist: Alm Clay, MD   Interval Summary   Patient was sitting up comfortably in bed during interview.  Reported that he had a cough that started last night.  Reported having a brief episode of shortness of breath this morning.  Denies any chest pain, or lower extremity edema.  Vital Signs Vitals:   05/25/24 1840 05/25/24 2025 05/25/24 2202 05/26/24 0430  BP: 102/63  101/70 (!) 90/58  Pulse: 81  81 81  Resp: 18  17 14   Temp:   (!) 97.5 F (36.4 C) 97.7 F (36.5 C)  TempSrc:      SpO2: 98% 99% 97% 97%  Weight:      Height:        Intake/Output Summary (Last 24 hours) at 05/26/2024 0844 Last data filed at 05/26/2024 9391 Gross per 24 hour  Intake 360 ml  Output 865 ml  Net -505 ml      05/25/2024    5:00 AM 05/24/2024    5:00 AM 05/23/2024    5:00 AM  Last 3 Weights  Weight (lbs) 166 lb 3.6 oz 164 lb 7.4 oz 165 lb 2 oz  Weight (kg) 75.4 kg 74.6 kg 74.9 kg      Telemetry/ECG  Normal sinus rhythm with heart rates in the 80s and 90s.  Wide QRS- Personally Reviewed  Physical Exam  GEN: No acute distress.  Alert and oriented on room air. Neck: No JVD Cardiac: RRR, no murmurs, rubs, or gallops.  Respiratory: Clear to auscultation bilaterally. GI: Soft, nontender, slightly distended. MS: No edema  Assessment & Plan  Chase Combs is a 79 y.o. male with PMH of recurrent colonic pseudo-obstruction/Ogilvie syndrome, CKD stage II, chronic biventricular HFrEF due to ischemic cardiomyopathy, NSTEMI s/p DES to LAD (01/17/24), remote h/o STEMI s/p PCI with BMS to pRCA (2010), s/p PCI with BMS to pRI (2006), paroxysmal atrial flutter, HTN, HLD, diabetes mellitus type 2, OSA, PUD, prostate cancer who was seen for the evaluation of pyridostigmine use.     Recurrent pseudoobstruction of colon Has had recurrent abdominal pain, nausea, vomiting.  Previously had NG tube decompression.   Was recently placed on a diet and is tolerating. It was felt like the patient would tolerate pyridostigmine. Patient was started on pyridostigmine on 05/24/2024 and plans to be stopped on 05/27/2024. Management per primary     Persistent atrial fibrillation The patient was recently placed on a diet.  Because of this IV heparin  was stopped and was started on Eliquis .  Patient is currently in sinus rhythm with heart rates in the 80s and 90s. Continue Eliquis  5mg  BID Continue Amiodarone  200mg  daily.     Chronic biventricular HFrEF Prior TEE on 01/2024 showed a reduced LVEF of 20 to 25%, global hypokinesis, moderately reduced RV systolic function, moderately dilated left atrium, mild MR, and mild aortic valve calcification.  Patient's weight is stable at 75 kg's.  Patient appears euvolemic on exam GDMT -  Limited by hypotension.  At about 4:30 AM this morning BP was 90/58. May consider starting Jardiance  10 mg daily.     CAD s/p DES to LAD on 12/2023.  Also has a remote history of multiple PCI's. Continue Plavix  75mg  daily     Otherwise management per primary     For questions or updates, please contact  [facility] Please consult [email or web address] for contact info under  Signed, Morse Clause, PA-C   History and all data above reviewed.  I personally took the history today, performed the physical exam and formulated the substantive portion of the assessment and plan.  I reviewed all relevant tests and studies.  He is up to eating solid foods.  He still has diarrhea.  He thinks he is feeling slowly better.  He had cough with white phlegm.  Patient examined.  I agree with the findings as above.  The patient exam reveals COR:RRR,  ,  Lungs: Decreased breath sounds at the bases  ,  Abd: Positive bowel sounds, no rebound no guarding, distended. , Ext mild edema   .  All available labs, radiology testing, previous records reviewed. Agree with documented assessment and plan.   Pseudo  obstruction. He is tolerating pyridostigmine with out bradycardia on tele.  He is only going to be on this a limited number of days so no home monitoring is needed.  Atrial fib:  Maintaining NSR.  No change in therapy.  Tolerating DOAC.     CAD:  No active ischemia.  Ischemic CM:  He seems to be euvolemic.  Hypotension precludes med titration.   Chase Combs  10:59 AM  05/26/2024  "

## 2024-05-26 NOTE — Progress Notes (Signed)
 " Progress Note    Chase Combs   FMW:[id number]  DOB: [birth year]  DOA: 05/19/2024     7 PCP: Wonda Worth SQUIBB, PA  Initial CC: Abdominal distention  Hospital Course: Chase Combs is a 79 y.o. 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  He has not been able to keep oral intake and has chronic abdominal distention and nausea vomiting diarrhea. Recent prolonged admit 11/28-12/18- 2/2 recurrent Ogilvie's syndrome, severe malnutrition profound diarrhea> and discharged to SNF. In ED  afebrile and  spo2 mid 90s on  RA,  mild tachycardia and stable blood pressure. Labs-sodium 128, BUN 67, creatinine 1.96, normal WBC, troponin 49, and proBNP 10,122.  CT is concerning for distal large bowel obstruction with transition point at the proximal sigmoid colon. GI was consulted by the ED, s/p ivf 500 cc LR and Reglan , and  NG tube and rectal tube ordered. Managed with conservative treatment, NG clamped and subsequently removed 12/29  Assessment and plan  Recurrent Pseudo-obstruction of colon Ogilvie syndrome Recurrent abdominal pain Nausea/vomiting - S/P NG tube decompression, overall with some clinical improvement and NG tube removed 12/29 and diet started - s/p relistor  - continue rectal tube; copious liquid stool still  - last xray still marked distension as well as on exam; hyperpitched and tinkling sounds in abdomen still - now on mestinon; cardiology following as well; overall seems to be tolerating. Phlegm/secretions tolerable for now and benefit of continuing still outweighs risk of stopping, esp as no other good alternatives - continue trending K and Mag - tolerating diet so far as well   AKI on CKD stage II Hypovolemic  hyponatremia Hypokalemia Metabolic acidosis Recent urinary obstruction Patient has electrolyte balance in the setting of nausea vomiting and obstruction, volume depletion. Electrolytes overall stable, will replace potassium K-Dur 40 x1, and start 20meq bid  2/2 ogilvie syndrome Avoid nephrotoxic medication. Cont Flomax   CAD Ischemic cardiomyopathy Mildly elevated troponin but no chest pain - Continue Plavix   Chronic HFrEF Recent prolonged hospitalization complicated by cardiogenic shock with acute CHF with LAD stent stenosis 01/25/24: Last EF 20-25%, Global HKN on 02/21/24, proBNP at baseline 10,122-was 15,2454 in 06/26/24.   PTA on Jardiance , Aldactone ,recently digoxin  Entresto  was held due to softer blood pressure Cardiology following close- not to resume digoxin  on d/c  Atrial fibrillation on Eliquis  Was on heparin > switched to oral Eliquis  as taking po. Cont amiodarone , monitor in telemetry  History of PUD Continue PPI p.o  T2DM Last A1c normal 5.5 last month, blood sugar is stable  OSA CPAP intolerance   Thyroid  nodules; renal lesions  Indeterminate thyroid  nodules and b/l renal lesions noted incidentally on CT in ED  Outpatient follow-up with thyroid  US  and renal protocol MRI recommended   Goals of care Patient remains very high risk for readmission with complex medical comorbidities and at risk for acute decompensation, overall prognosis does not appear bright.He was seen by palliative care on his last admission-he is DNR limited with full scope of intervention.  Depression Anxiety Mood is stable, continue SSRI, prn Atarax  Deconditioning PT Orders: Active PT Follow up Rec: Home Health Pt (If Slow Progress, Might Need To Reconsider Snf)05/22/2024 1200   Interval History:  No events overnight.  Has been started on Mestinon for  further treatment and tolerating well but no significant effect yet.   Antimicrobials:   DVT prophylaxis:   apixaban  (ELIQUIS ) tablet 5  mg   Code Status:   Code Status: Limited: Do not attempt resuscitation (DNR) -DNR-LIMITED -Do Not Intubate/DNI   Mobility Assessment (Last 72 Hours)     Mobility Assessment     Row Name 05/26/24 1100 05/25/24 2250 05/25/24 1600 05/25/24 1500 05/25/24 1200   Does the patient have exclusion criteria? No- Perform mobility assessment No- Perform mobility assessment No- Perform mobility assessment -- No- Perform mobility assessment   What is the highest level of mobility based on the mobility assessment? Level 4 (Ambulates with assistance) - Balance while stepping forward/back - Complete Level 4 (Ambulates with assistance) - Balance while stepping forward/back - Complete Level 4 (Ambulates with assistance) - Balance while stepping forward/back - Complete Level 3 (Stands with assistance) - Balance while standing  and cannot march in place Level 4 (Ambulates with assistance) - Balance while stepping forward/back - Complete   Is the above level different from baseline mobility prior to current illness? Yes - Recommend PT order Yes - Recommend PT order Yes - Recommend PT order -- Yes - Recommend PT order    Row Name 05/25/24 0800 05/24/24 2045 05/24/24 1500 05/24/24 1026 05/24/24 1021   Does the patient have exclusion criteria? No- Perform mobility assessment No- Perform mobility assessment -- No- Perform mobility assessment No- Perform mobility assessment   What is the highest level of mobility based on the mobility assessment? Level 4 (Ambulates with assistance) - Balance while stepping forward/back - Complete Level 2 (Chairfast) - Balance while sitting on edge of bed and cannot stand Level 3 (Stands with assistance) - Balance while standing  and cannot march in place Level 2 (Chairfast) - Balance while sitting on edge of bed and cannot stand Level 2 (Chairfast) - Balance while sitting on edge of bed and cannot stand   Is the above level different from baseline mobility prior to current illness? Yes -  Recommend PT order Yes - Recommend PT order -- Yes - Recommend PT order Yes - Recommend PT order    Row Name 05/23/24 2045           Does the patient have exclusion criteria? No- Perform mobility assessment       What is the highest level of mobility based on the mobility assessment? Level 2 (Chairfast) - Balance while sitting on edge of bed and cannot stand       Is the above level different from baseline mobility prior to current illness? Yes - Recommend PT order          Diet: Diet Orders (From admission, onward)     Start     Ordered   05/23/24 1010  Diet Heart Room service appropriate? Yes; Fluid consistency: Thin  Diet effective now       Comments: soft  Question Answer Comment  Room service appropriate? Yes   Fluid consistency: Thin      05/23/24 1009            Barriers to discharge: none Disposition Plan:  Home HH orders placed: n/a Status is: Inpt  Objective: Blood pressure 92/61, pulse 79, temperature 98.1 F (36.7 C), resp. rate 17, height 6' (1.829 m), weight 75.4 kg, SpO2 98%.  Examination:  Physical Exam Constitutional:      General: He is not in acute distress.    Appearance: Normal appearance.  HENT:  Head: Normocephalic and atraumatic.     Mouth/Throat:     Mouth: Mucous membranes are moist.  Eyes:     Extraocular Movements: Extraocular movements intact.  Cardiovascular:     Rate and Rhythm: Normal rate and regular rhythm.  Pulmonary:     Effort: Pulmonary effort is normal. No respiratory distress.     Breath sounds: Normal breath sounds. No wheezing.  Abdominal:     General: There is distension (very noticable).     Palpations: Abdomen is soft.     Tenderness: There is no abdominal tenderness.     Comments: Uncomfortable from distention but no overt pain.  Hyper pitched and tinkling bowel sounds  Musculoskeletal:        General: Normal range of motion.     Cervical back: Normal range of motion and neck supple.  Skin:    General:  Skin is warm and dry.  Neurological:     General: No focal deficit present.     Mental Status: He is alert.  Psychiatric:        Mood and Affect: Mood normal.        Behavior: Behavior normal.      Consultants:  GI Cardiology  Procedures:    Data Reviewed: Results for orders placed or performed during the hospital encounter of 05/19/24 (from the past 24 hours)  Basic metabolic panel with GFR     Status: Abnormal   Collection Time: 05/26/24  6:12 AM  Result Value Ref Range   Sodium 133 (L) 135 - 145 mmol/L   Potassium 4.2 3.5 - 5.1 mmol/L   Chloride 102 98 - 111 mmol/L   CO2 23 22 - 32 mmol/L   Glucose, Bld 136 (H) 70 - 99 mg/dL   BUN 15 8 - 23 mg/dL   Creatinine, Ser 9.26 0.61 - 1.24 mg/dL   Calcium  8.6 (L) 8.9 - 10.3 mg/dL   GFR, Estimated >39 >39 mL/min   Anion gap 9 5 - 15  Magnesium      Status: None   Collection Time: 05/26/24  6:12 AM  Result Value Ref Range   Magnesium  1.7 1.7 - 2.4 mg/dL    I have reviewed pertinent nursing notes, vitals, labs, and images as necessary. I have ordered labwork to follow up on as indicated.  I have reviewed the last notes from staff over past 24 hours. I have discussed patient's care plan and test results with nursing staff, CM/SW, and other staff as appropriate.  Old records reviewed in assessment of this patient  Time spent: Greater than 50% of the 55 minute visit was spent in counseling/coordination of care for the patient as laid out in the A&P.   LOS: 7 days   Chase Apo, MD [facility] 05/26/2024, 4:59 PM "

## 2024-05-26 NOTE — Progress Notes (Signed)
 [facility] Progress Note  Chief Complaint: Chronic colonic pseudoobstruction  History:  No change in abdominal girth.  He is having copious loose stool and thinks his fecal collection system may have become dislodged.  He has a puddle of loose liquid brown stool in the bed at present.  (No blood)  Stable on the cardiac monitor wide-complex sinus rhythm in the 70s at present  Cardiology has also been following, which we greatly appreciate.  Chase Combs is complaining of copious phlegm this morning.  He is bringing up clear sputum, not clear if it is coming from his lungs or if he has sinus drainage or excess salivation.  ROS: Cardiovascular: No chest pain Respiratory: Respiratory status stable Urinary: Denies dysuria, has catheter  Objective:  Current Medications[1]   magnesium  sulfate bolus IVPB 2 g (05/26/24 1034)     Vital signs in last 24 hrs: Vitals:   05/25/24 2202 05/26/24 0430  BP: 101/70 (!) 90/58  Pulse: 81 81  Resp: 17 14  Temp: (!) 97.5 F (36.4 C) 97.7 F (36.5 C)  SpO2: 97% 97%    Intake/Output Summary (Last 24 hours) at 05/26/2024 1117 Last data filed at 05/26/2024 1040 Gross per 24 hour  Intake 360 ml  Output 1065 ml  Net -705 ml     Physical Exam  Chronically ill-appearing man, though stable overall.  Looks about the same as I saw him yesterday. He has a spit bag with clear sputum and fluid and it Cardiac: RRR without murmurs, S1S2 heard, no peripheral edema Pulm: clear to auscultation bilaterally, normal RR and effort noted Abdomen: soft, softly distended and tympanitic with high-pitched bowel sounds and no tenderness, similar to my exam yesterday.   Skin; warm and dry, no jaundice  Recent Labs:     Latest Ref Rng & Units 05/25/2024    5:02 AM 05/24/2024    6:03 AM 05/23/2024    5:28 AM  CBC  WBC 4.0 - 10.5 K/uL 12.8  5.6  6.0   Hemoglobin 13.0 - 17.0 g/dL 86.9  87.5  87.7   Hematocrit 39.0 - 52.0 % 38.4  36.8  37.2   Platelets 150 - 400  K/uL 329  314  356     Recent Labs  Lab 05/19/24 1418  INR 2.0*      Latest Ref Rng & Units 05/26/2024    6:12 AM 05/25/2024    5:02 AM 05/24/2024    6:03 AM  CMP  Glucose 70 - 99 mg/dL 863  892  96   BUN 8 - 23 mg/dL 15  12  13    Creatinine 0.61 - 1.24 mg/dL 9.26  9.36  9.41   Sodium 135 - 145 mmol/L 133  134  134   Potassium 3.5 - 5.1 mmol/L 4.2  4.2  3.3   Chloride 98 - 111 mmol/L 102  103  102   CO2 22 - 32 mmol/L 23  24  25    Calcium  8.9 - 10.3 mg/dL 8.6  8.2  8.1      Radiologic studies:   Assessment & Plan  Assessment: Chronic colonic pseudoobstruction, no change thus far since starting low-dose Mestinon (15 mg twice daily) about 48 hours ago  Difficult to tell if his phlegm and URI fluid is excess salivation as a potential side effect of the pyridostigmine or something else like chronic sinus drainage or bronchial secretions. Lungs are clear on exam.  Cardiology service feels he is euvolemic   Plan: Chase Combs and  I had a discussion about it, and we agreed to try the higher dose of Mestinon (30 mg twice daily) to assess for both effect on bowel decompression and any side effects including but not limited to what he is describing today. Depending on whether or not that works and how he otherwise feels we can decide whether or not to continue.  If that does not work or does not seem to be tolerated due to some side effects, then we will be out of medicine options to treat this condition beyond what we have already tried.  I spent total of 35 minutes in both face-to-face (interview/exam) and non-face-to-face (chart review, care coordination, documentation)  activities, excluding procedures performed, for the visit on the date of this encounter.     Chase Combs Office: [phone]     [1]  Current Facility-Administered Medications:    amiodarone  (PACERONE ) tablet 200 mg, 200 mg, Oral, Daily, Kc, Ramesh, MD, 200 mg at 05/26/24 9161   apixaban  (ELIQUIS )  tablet 5 mg, 5 mg, Oral, BID, Dang, Thuy D, RPH, 5 mg at 05/26/24 9161   budesonide  (PULMICORT ) nebulizer solution 0.25 mg, 0.25 mg, Nebulization, BID, Kc, Ramesh, MD, 0.25 mg at 05/25/24 2024   clopidogrel  (PLAVIX ) tablet 75 mg, 75 mg, Oral, Daily, Kc, Ramesh, MD, 75 mg at 05/26/24 9162   cyanocobalamin  (VITAMIN B12) tablet 1,000 mcg, 1,000 mcg, Oral, Daily, Kc, Ramesh, MD, 1,000 mcg at 05/26/24 9162   feeding supplement (ENSURE PLUS HIGH PROTEIN) liquid 237 mL, 237 mL, Oral, BID BM, Girguis, David, MD, 237 mL at 05/25/24 1600   fentaNYL  (SUBLIMAZE ) injection 12.5-50 mcg, 12.5-50 mcg, Intravenous, Q2H PRN, Opyd, Timothy S, MD, 25 mcg at 05/25/24 1832   hydrOXYzine (ATARAX) tablet 25 mg, 25 mg, Oral, QHS PRN, Kc, Ramesh, MD   ipratropium-albuterol  (DUONEB) 0.5-2.5 (3) MG/3ML nebulizer solution 3 mL, 3 mL, Nebulization, Q6H PRN, Opyd, Timothy S, MD   liver oil-zinc  oxide (DESITIN) 40 % ointment, , Topical, QID PRN, Kc, Ramesh, MD   magnesium  sulfate IVPB 2 g 50 mL, 2 g, Intravenous, Once, Patsy Lenis, MD, Last Rate: 50 mL/hr at 05/26/24 1034, 2 g at 05/26/24 1034   Oral care mouth rinse, 15 mL, Mouth Rinse, PRN, Patsy Lenis, MD   pantoprazole  (PROTONIX ) EC tablet 40 mg, 40 mg, Oral, Daily, Kc, Ramesh, MD, 40 mg at 05/26/24 0837   polyethylene glycol (MIRALAX  / GLYCOLAX ) packet 17 g, 17 g, Oral, BID, Esterwood, Amy S, PA-C, 17 g at 05/26/24 9160   potassium chloride  SA (KLOR-CON  M) CR tablet 20 mEq, 20 mEq, Oral, BID, Kc, Ramesh, MD, 20 mEq at 05/26/24 9162   prochlorperazine  (COMPAZINE ) injection 5 mg, 5 mg, Intravenous, Q6H PRN, Opyd, Timothy S, MD, 5 mg at 05/26/24 0322   pyridostigmine (MESTINON) 60 MG/5ML solution 15.6 mg, 15.6 mg, Oral, BID, Pham, Minh Q, RPH-CPP, 15.6 mg at 05/26/24 0839   revefenacin  (YUPELRI ) nebulizer solution 175 mcg, 175 mcg, Nebulization, Daily, Kc, Ramesh, MD, 175 mcg at 05/25/24 0748   sertraline  (ZOLOFT ) tablet 50 mg, 50 mg, Oral, q morning, Kc, Ramesh, MD, 50 mg  at 05/26/24 0837   sodium chloride  flush (NS) 0.9 % injection 3 mL, 3 mL, Intravenous, Q12H, Opyd, Timothy S, MD, 3 mL at 05/26/24 9160   tamsulosin  (FLOMAX ) capsule 0.4 mg, 0.4 mg, Oral, QHS, Kc, Ramesh, MD, 0.4 mg at 05/25/24 2250

## 2024-05-26 NOTE — Progress Notes (Addendum)
 Physical Therapy Treatment Patient Details Name: Chase Combs MRN: [id number] DOB: 1946/01/07 Today's Date: 05/26/2024   History of Present Illness Pt is a 79 y.o male admitted from [facility] for N/V/D. 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 Symptomatic orthostatic hypotension 12/31 during PT session. PMH: NSTEMI, CAD, heart failure, a fib, HTN, HLD, DMII, CKD, OSA, prostate cancer, Ogilvie syndrome    PT Comments  Pt received in supine, agreeable to therapy session with encouragement, with emphasis on transfer and gait training/pre-gait tasks at bedside and vitals signs assessment with postural changes. Pt continue to demonstrate symptomatic orthostatic hypotension, despite BLE knee-high TED hose applied prior to OOB activity, however BP slightly improved this date compared with assessment 2 days ago when BLE compression socks not applied. Pt continues to benefit from PT services to progress toward functional mobility goals, continue to recommend HHPT upon DC as long as pt/spouse agree she can provide light lifting assist for him PRN. Discussion on energy conservation and benefits of OOB mobility, pt refusing to sit up in chair post-exertion this date.   Vital Signs  Patient Position (if appropriate) Orthostatic Vitals  Orthostatic Lying   BP- Lying 95/56  Pulse- Lying 94  Orthostatic Sitting  BP- Sitting (!) 80/64 (71)  Pulse- Sitting 95  BP - Sitting after 5 mins          84/69 (75) ; HR 95  Orthostatic Standing at 0 minutes  BP- Standing at 0 minutes (!) 76/54 (63)  Pulse- Standing at 0 minutes 96   Orthostatic Lying   BP- Lying after return to bed 120/70  Pulse- Lying 90     If plan is discharge home, recommend the following: A lot of help with walking and/or transfers;A lot of help with bathing/dressing/bathroom;Assistance with cooking/housework;Assist for transportation;Help with stairs or ramp for entrance   Can travel by private vehicle        Equipment  Recommendations  None recommended by PT (well-equipped)    Recommendations for Other Services       Precautions / Restrictions Precautions Precautions: Fall;Other (comment) Recall of Precautions/Restrictions: Intact Precaution/Restrictions Comments: watch BP, rectal pouch Restrictions Weight Bearing Restrictions Per Provider Order: No     Mobility  Bed Mobility Overal bed mobility: Needs Assistance Bed Mobility: Supine to Sit, Rolling, Sit to Supine Rolling: Used rails, Supervision   Supine to sit: Supervision, HOB elevated, Used rails Sit to supine: Supervision, HOB elevated, Used rails   General bed mobility comments: No physical assist needed, cues for lines/safety    Transfers Overall transfer level: Needs assistance Equipment used: Rolling walker (2 wheels) Transfers: Sit to/from Stand, Bed to chair/wheelchair/BSC Sit to Stand: Min assist   Step pivot transfers: Min assist       General transfer comment: EOB<>RW with CGA to minA, as he fatigued needed minA more frequently. Pt able to perform STS x 5 reps but unless using elevated bed rail, needs lift assist to stand.    Ambulation/Gait Ambulation/Gait assistance: Min assist Gait Distance (Feet): 6 Feet (x2 with seated rest breaks between) Assistive device: Rolling walker (2 wheels) Gait Pattern/deviations: Step-through pattern Gait velocity: dec   Pre-gait activities: standing marches x10 reps at RW prior to gait trials listed above General Gait Details: Distance limited to forward/backward stepping at bedside and lateral steps toward [facility] due to pt c/o fatigue and nausea; when orthostatic BP assessment taken, pt noted to be orthostatic MAP (63) standing, MAP (87) after return to supine. RN/MD notified.  Stairs             Wheelchair Mobility     Tilt Bed    Modified Rankin (Stroke Patients Only)       Balance Overall balance assessment: Mild deficits observed, not formally tested                                           Communication Communication Communication: No apparent difficulties  Cognition Arousal: Alert Behavior During Therapy: WFL for tasks assessed/performed   PT - Cognitive impairments: No apparent impairments                         Following commands: Intact      Cueing Cueing Techniques: Verbal cues, Gestural cues  Exercises Other Exercises Other Exercises: reciprocal STS x 5 reps x1 sets with BUE support. Other Exercises: standing BLE AROM: hip flexion x10 reps ea    General Comments General comments (skin integrity, edema, etc.): see BP in comments above; SpO2/HR WFL on RA      Pertinent Vitals/Pain Pain Assessment Pain Assessment: No/denies pain Faces Pain Scale: No hurt Pain Intervention(s): Monitored during session, Limited activity within patient's tolerance, Repositioned    Home Living                          Prior Function            PT Goals (current goals can now be found in the care plan section) Acute Rehab PT Goals Patient Stated Goal: get stronger and return home PT Goal Formulation: With patient Time For Goal Achievement: 06/05/24 Progress towards PT goals: Progressing toward goals    Frequency    Min 2X/week      PT Plan      Co-evaluation              AM-PAC PT 6 Clicks Mobility   Outcome Measure  Help needed turning from your back to your side while in a flat bed without using bedrails?: A Little Help needed moving from lying on your back to sitting on the side of a flat bed without using bedrails?: A Little Help needed moving to and from a bed to a chair (including a wheelchair)?: A Little Help needed standing up from a chair using your arms (e.g., wheelchair or bedside chair)?: A Little Help needed to walk in hospital room?: Total (<39ft) Help needed climbing 3-5 steps with a railing? : Total 6 Click Score: 14    End of Session Equipment Utilized During  Treatment: Gait belt;Other (comment) (disposable briefs donned prior to gait trials, doffed prior to return to supine) Activity Tolerance: Patient tolerated treatment well;Treatment limited secondary to medical complications (Comment);Other (comment);Patient limited by fatigue (Dizzy in standing and nausea, BP drop along with symptoms) Patient left: with call bell/phone within reach;Other (comment);in bed;with bed alarm set (pt defers OOB to chair today) Nurse Communication: Mobility status;Precautions;Other (comment) (pt needs TED hose due to symptomatic drop in BP) PT Visit Diagnosis: Other abnormalities of gait and mobility (R26.89);Muscle weakness (generalized) (M62.81)     Time: 8341-8275 PT Time Calculation (min) (ACUTE ONLY): 26 min  Charges:    $Gait Training: 8-22 mins $Therapeutic Activity: 8-22 mins PT General Charges $$ ACUTE PT VISIT: 1 Visit  Connell SQUIBB., PTA Acute Rehabilitation Services Secure Chat Preferred 9a-5:30pm Office: [phone]    Connell CHRISTELLA Blue 05/26/2024, 5:46 PM

## 2024-05-27 DIAGNOSIS — I2510 Atherosclerotic heart disease of native coronary artery without angina pectoris: Secondary | ICD-10-CM | POA: Diagnosis not present

## 2024-05-27 DIAGNOSIS — I4819 Other persistent atrial fibrillation: Secondary | ICD-10-CM | POA: Diagnosis not present

## 2024-05-27 DIAGNOSIS — I2583 Coronary atherosclerosis due to lipid rich plaque: Secondary | ICD-10-CM | POA: Diagnosis not present

## 2024-05-27 DIAGNOSIS — I5042 Chronic combined systolic (congestive) and diastolic (congestive) heart failure: Secondary | ICD-10-CM | POA: Diagnosis not present

## 2024-05-27 DIAGNOSIS — E7800 Pure hypercholesterolemia, unspecified: Secondary | ICD-10-CM | POA: Diagnosis not present

## 2024-05-27 DIAGNOSIS — K5981 Ogilvie syndrome: Secondary | ICD-10-CM | POA: Diagnosis not present

## 2024-05-27 LAB — BASIC METABOLIC PANEL WITH GFR
Anion gap: 9 (ref 5–15)
BUN: 15 mg/dL (ref 8–23)
CO2: 19 mmol/L — ABNORMAL LOW (ref 22–32)
Calcium: 8.1 mg/dL — ABNORMAL LOW (ref 8.9–10.3)
Chloride: 107 mmol/L (ref 98–111)
Creatinine, Ser: 0.7 mg/dL (ref 0.61–1.24)
GFR, Estimated: >60 mL/min
Glucose, Bld: 88 mg/dL (ref 70–99)
Potassium: 4.4 mmol/L (ref 3.5–5.1)
Sodium: 134 mmol/L — ABNORMAL LOW (ref 135–145)

## 2024-05-27 LAB — MAGNESIUM: Magnesium: 2 mg/dL (ref 1.7–2.4)

## 2024-05-27 MED ORDER — DEXTROSE IN LACTATED RINGERS 5 % IV SOLN: INTRAVENOUS | Status: AC

## 2024-05-27 MED ORDER — GERHARDT'S BUTT CREAM
TOPICAL_CREAM | Freq: Three times a day (TID) | CUTANEOUS | Status: DC
  Filled 2024-05-27: qty 60

## 2024-05-27 NOTE — Progress Notes (Signed)
 Occupational Therapy Treatment Patient Details Name: Chase Combs MRN: [id number] DOB: 08-10-1945 Today's Date: 05/27/2024   History of present illness Pt is a 79 y.o male admitted from [facility] for N/V/D. 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 Symptomatic orthostatic hypotension 12/31 during PT session. PMH: NSTEMI, CAD, heart failure, a fib, HTN, HLD, DMII, CKD, OSA, prostate cancer, Ogilvie syndrome   OT comments  Pt. Seen for skilled OT treatment session.  Pt. Still presenting with rectal tube leakage despite multiple adjustments from nursing staff.  However, pt. remains motivated and eager for participation in treatment session.  Bed mobility with S for full clean up and linen change.  Able to sit un supported eob without issue.  Sit/stand and side steps to hob with CGA.  Presented and reviewed 5Ps for energy conservation handout. Pt. Receptive to education and able to return demo of PLB tech.  BPs remain orthostatic.  RN aware see below for details.  Cont. With acute OT POC.    TED hose on Supine to sit: 126/111-116 Sit after stand and side steps: 89/58-69      If plan is discharge home, recommend the following:  A lot of help with walking and/or transfers;A lot of help with bathing/dressing/bathroom;Assistance with cooking/housework;Assist for transportation   Equipment Recommendations  None recommended by OT    Recommendations for Other Services      Precautions / Restrictions Precautions Precautions: Fall;Other (comment) Precaution/Restrictions Comments: watch BP, rectal pouch       Mobility Bed Mobility Overal bed mobility: Needs Assistance Bed Mobility: Rolling, Supine to Sit, Sit to Supine Rolling: Used rails, Supervision   Supine to sit: Supervision, HOB elevated, Used rails     General bed mobility comments: No physical assist needed    Transfers Overall transfer level: Needs assistance Equipment used: Rolling walker (2  wheels) Transfers: Sit to/from Stand Sit to Stand: Contact guard assist           General transfer comment: sit/stand from eob without physical assistance.  side step 2-3 steps without assistance towards hob in prep for back to bed     Balance                                           ADL either performed or assessed with clinical judgement   ADL Overall ADL's : Needs assistance/impaired                 Upper Body Dressing : Set up;Bed level Upper Body Dressing Details (indicate cue type and reason): don/doff clean gown       Toilet Transfer Details (indicate cue type and reason): simulated sit/stand from eob S and able to side step towards hob without physical assistance Toileting- Clothing Manipulation and Hygiene: Bed level;Total assistance Toileting - Clothing Manipulation Details (indicate cue type and reason): Rectal tube-total A for clean up cont. leakage bed level, pt. assists with rolling in bed w/o assistance     Functional mobility during ADLs: Contact guard assist;Rolling walker (2 wheels) General ADL Comments: Pt CGA for STS and mobility using RW for support.  5Ps handout provided and reviewed with pt., pt. Receptive to education     Extremity/Trunk Insurance Account Manager  Communication Communication: No apparent difficulties Factors Affecting Communication: Hearing impaired   Cognition Arousal: Alert Behavior During Therapy: WFL for tasks assessed/performed Cognition: No apparent impairments             OT - Cognition Comments: grossly WFLs                 Following commands: Intact Following commands impaired: Follows one step commands with increased time      Cueing   Cueing Techniques: Verbal cues, Gestural cues  Exercises      Shoulder Instructions       General Comments  Pt. Loves to help out with the fundraiser's at his church. He and his  brother in law smoke 89 pork shoulders and all the side dishes. Pt. Eager to return to these activities     Pertinent Vitals/ Pain       Pain Assessment Pain Assessment: No/denies pain  Home Living                                          Prior Functioning/Environment              Frequency  Min 2X/week        Progress Toward Goals  OT Goals(current goals can now be found in the care plan section)  Progress towards OT goals: Progressing toward goals     Plan      Co-evaluation                 AM-PAC OT 6 Clicks Daily Activity     Outcome Measure   Help from another person eating meals?: None Help from another person taking care of personal grooming?: A Little Help from another person toileting, which includes using toliet, bedpan, or urinal?: A Lot Help from another person bathing (including washing, rinsing, drying)?: A Lot Help from another person to put on and taking off regular upper body clothing?: A Little Help from another person to put on and taking off regular lower body clothing?: A Little 6 Click Score: 17    End of Session Equipment Utilized During Treatment: Rolling walker (2 wheels)  OT Visit Diagnosis: Other abnormalities of gait and mobility (R26.89);Muscle weakness (generalized) (M62.81);Unsteadiness on feet (R26.81)   Activity Tolerance Patient tolerated treatment well   Patient Left in bed;with call bell/phone within reach;with bed alarm set   Nurse Communication Other (comment) (rn states ok to work with pt., notified of leakage and full linen change and clean up, also BPs taken during session)        Time: 8488-8454 OT Time Calculation (min): 34 min  Charges: OT General Charges $OT Visit: 1 Visit OT Treatments $Self Care/Home Management : 23-37 mins  Randall, COTA/L Acute Rehabilitation [phone]   CHRISTELLA Nest Lorraine-COTA/L  05/27/2024, 4:27 PM

## 2024-05-27 NOTE — Progress Notes (Addendum)
 Patient ID: Chase Combs, male   DOB: [birth year], 79 y.o.   MRN: [id number]    Progress Note   Subjective   Day # 8 CC; chronic colonic pseudoobstruction with exacerbation  Mestinon  initiated about 48 hours ago, dose titrated up, rhythm remains stable on monitor wide-complex sinus rhythm rate in the 80s  Patient says he feels pretty good today he has been tolerating a soft diet.  He says he feels something is helping because he is not having any significant abdominal tenderness today, had increased soreness over the previous couple of days.  He is not having any nausea or vomiting.  Flexi-Seal remains in place with liquid stool current cough or complaint of increased sputum, phlegm   Labs today sodium 134/potassium 4.4/BUN 15/creatinine 0.7 magnesium  2.0    Objective   Vital signs in last 24 hours: Temp:  [97.9 F (36.6 C)-98.4 F (36.9 C)] 97.9 F (36.6 C) (01/03 1255) Pulse Rate:  [64-88] 75 (01/03 1255) Resp:  [15-18] 15 (01/03 1255) BP: (106-145)/(69-83) 109/69 (01/03 1255) SpO2:  [96 %-97 %] 96 % (01/03 1255) Weight:  [75 kg] 75 kg (01/03 0457) Last BM Date : 05/26/24 General: Elderly white male in NAD, pleasant, in good spirits Heart:  Regular rate and rhythm; no murmurs Lungs: Respirations even and unlabored, lungs CTA bilaterally Abdomen:  Softer no focal tenderness, no guarding, bowel sounds are present, no palpable loops of bowel Extremities:  Without edema. Neurologic:  Alert and oriented,  grossly normal neurologically. Psych:  Cooperative. Normal mood and affect.  Intake/Output from previous day: 01/02 0701 - 01/03 0700 In: 960 [P.O.:960] Out: 1900 [Urine:1100; Stool:800] Intake/Output this shift: No intake/output data recorded.  Lab Results: Recent Labs    05/25/24 0502  WBC 12.8*  HGB 13.0  HCT 38.4*  PLT 329   BMET Recent Labs    05/25/24 0502 05/26/24 0612 05/27/24 0825  NA 134* 133* 134*  K 4.2 4.2 4.4  CL 103 102 107  CO2 24 23 19*   GLUCOSE 107* 136* 88  BUN 12 15 15   CREATININE 0.63 0.73 0.70  CALCIUM  8.2* 8.6* 8.1*   LFT No results for input(s): PROT, ALBUMIN, AST, ALT, ALKPHOS, BILITOT, BILIDIR, IBILI in the last 72 hours. PT/INR No results for input(s): LABPROT, INR in the last 72 hours.      Assessment / Plan:    #60 79 year old white male with history of what appears to be a chronic colonic pseudoobstruction, admitted 8 days ago with exacerbation from his rehab facility.  Symptoms not as severe this admission as in the past He has not required decompression this admission.  No real response to Relistor  and Mestinon  was initiated, now titrated to 30 mg twice daily.  He seems to be tolerating this well, no adverse cardiac issues and continues on monitor. 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  Sleep apnea #7 history of ischemic colitis August 2025  Plan-continue current regimen, soft diet Continue Mestinon  30 mg p.o. twice daily Plan to remove the fecal collecting system tomorrow, and see how he does GI will continue to follow  Principal Problem:   Colonic pseudoobstruction Active Problems:   Coronary artery disease due to lipid rich plaque   Hypokalemia   Ogilvie syndrome   PUD (peptic  ulcer disease)   Acute kidney injury   Paroxysmal atrial flutter (HCC)   Chronic combined systolic (congestive) and diastolic (congestive) heart failure (HCC)   Thyroid  nodule   Renal lesion   Abdominal distention     LOS: 8 days   Amy Esterwood PA-C 05/27/2024, 6:18 PM  I have taken an interval history, thoroughly reviewed the chart and examined the patient. I agree with the Advanced Practitioner's note, impression and recommendations, and have recorded  additional findings, impressions and recommendations below. I performed a substantive portion of this encounter (>50% time spent), including a complete performance of the medical decision making.  My additional thoughts are as follows:  Feels a bit better with less abdominal distention.  Abdomen softly distended (less so than yesterday), with high-pitched bowel sounds, no focal tenderness.  Liquid stool in the fecal collection bag  He no longer has the phlegm production he was complaining of yesterday, and his cardiac rate and rhythm remained stable.  Our plan is to continue the Mestinon  30 mg by mouth twice daily, keep him on a regular diet, and discontinue his Flexi-Seal tube tomorrow.  His stool will continue to be loose, even if the Mestinon  has improved effect in the coming days to weeks because he has a chronic colonic pseudoobstruction.  Nevertheless, we hope that by removing his fecal collection system it will allow more natural passage of gas and stool as well as improve his ability to ambulate (which should improve his GI motility). _________________  This consultation required a moderate degree of medical decision making due to the nature and complexity of the acute condition(s) being evaluated as well as the patient's medical comorbidities.  Victory LITTIE Brand III [phone]

## 2024-05-27 NOTE — Progress Notes (Signed)
 Physical Therapy Treatment Patient Details Name: Chase Combs MRN: [id number] DOB: [birth year] Today's Date: 05/27/2024   History of Present Illness Pt is a 79 y.o male admitted from [facility] for N/V/D. 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 Symptomatic orthostatic hypotension 12/31 during PT session. PMH: NSTEMI, CAD, heart failure, a fib, HTN, HLD, DMII, CKD, OSA, prostate cancer, Ogilvie syndrome    PT Comments  Pt received in supine, c/o fatigue after working with OT but agreeable to therapy session at bed level for UE/LE strengthening. Pt able to tolerate some gentle graded resistance to UE/LE during exercises, c/o 6/10 modified RPE post-exertion. Worked on transfer to long sitting as well and pt encouraged to keep bed in chair posture at end of session to work on improved pulomonary clearance and build tolerance for OOB as pt defers transfer from bed to chair most of the week. Pt reports flexi-seal still leaking, nursing staff aware. Pt continues to benefit from PT services to progress toward functional mobility goals, continue to recommend HHPT upon DC unless spouse unable to provide minA lift assist.     If plan is discharge home, recommend the following: A lot of help with walking and/or transfers;A lot of help with bathing/dressing/bathroom;Assistance with cooking/housework;Assist for transportation;Help with stairs or ramp for entrance   Can travel by private vehicle        Equipment Recommendations  None recommended by PT    Recommendations for Other Services       Precautions / Restrictions Precautions Precautions: Fall;Other (comment) Recall of Precautions/Restrictions: Intact Precaution/Restrictions Comments: watch BP, rectal pouch Restrictions Weight Bearing Restrictions Per Provider Order: No     Mobility  Bed Mobility Overal bed mobility: Needs Assistance Bed Mobility: Supine to Sit, Sit to Supine     Supine to sit: Contact guard, HOB  elevated, Used rails (to long sit) Sit to supine: Supervision, HOB elevated, Used rails   General bed mobility comments: CGA to long sitting in bed, pt defers EOB/OOB due to fatigue and leaking rectal tube after recently returning to bed from standing with OT. Pt performed transfer to long sitting multiple reps during UE strengthening activity during session.    Transfers                   General transfer comment: pt defers after recently working with OT    Ambulation/Gait                   Stairs             Wheelchair Mobility     Tilt Bed    Modified Rankin (Stroke Patients Only)       Balance                                            Communication Communication Communication: No apparent difficulties Factors Affecting Communication: Hearing impaired  Cognition Arousal: Alert Behavior During Therapy: WFL for tasks assessed/performed   PT - Cognitive impairments: No apparent impairments                         Following commands: Intact Following commands impaired: Follows one step commands with increased time    Cueing Cueing Techniques: Verbal cues, Gestural cues  Exercises Other Exercises Other Exercises: bed crunches x10 reps in supine Other Exercises: supine to long  sitting x5 reps with single UE supported Other Exercises: bed chair posture BLE AROM: hip flexion, SAQ, hip ab/adduction (gentle resistance within tolerance each exercise) x10 reps ea Other Exercises: bed chair posture UE strengthening: pushing/pulling Rowing with each arm with gentle resistance within tolerance x10 reps ea    General Comments General comments (skin integrity, edema, etc.): HR WFL, no acute s/sx distress with bed-level exercises      Pertinent Vitals/Pain Pain Assessment Pain Assessment: No/denies pain    Home Living                          Prior Function            PT Goals (current goals can now be  found in the care plan section) Acute Rehab PT Goals Patient Stated Goal: Get stronger and return home PT Goal Formulation: With patient Time For Goal Achievement: 06/05/24 Progress towards PT goals: Progressing toward goals    Frequency    Min 2X/week      PT Plan      Co-evaluation              AM-PAC PT 6 Clicks Mobility   Outcome Measure  Help needed turning from your back to your side while in a flat bed without using bedrails?: A Little Help needed moving from lying on your back to sitting on the side of a flat bed without using bedrails?: A Little Help needed moving to and from a bed to a chair (including a wheelchair)?: A Little Help needed standing up from a chair using your arms (e.g., wheelchair or bedside chair)?: A Little Help needed to walk in hospital room?: Total Help needed climbing 3-5 steps with a railing? : Total 6 Click Score: 14    End of Session   Activity Tolerance: Patient tolerated treatment well;Patient limited by fatigue Patient left: in bed;with call bell/phone within reach;with bed alarm set;Other (comment) (bed elevated to chair posture, HOB ~50 deg and pillows under arms for support, pt encouraged to remain upright 30-45 mins if able) Nurse Communication: Mobility status;Other (comment) (pt encouraged to keep bed elevated for a while for improved pulmonary clearance; he refused OOB, already stood with OT) PT Visit Diagnosis: Other abnormalities of gait and mobility (R26.89);Muscle weakness (generalized) (M62.81)     Time: 8371-8359 PT Time Calculation (min) (ACUTE ONLY): 12 min  Charges:    $Therapeutic Exercise: 8-22 mins PT General Charges $$ ACUTE PT VISIT: 1 Visit                     Izaah Westman P., PTA Acute Rehabilitation Services Secure Chat Preferred 9a-5:30pm Office: [phone]    [name] [facility] 05/27/2024, 5:17 PM

## 2024-05-27 NOTE — Progress Notes (Signed)
 " Progress Note    Chase Combs   FMW:[id number]  DOB: 08/13/1945  DOA: 05/19/2024     8 PCP: Wonda Worth SQUIBB, PA  Initial CC: Abdominal distention  Hospital Course: Chase Combs is a 79 y.o. 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  He has not been able to keep oral intake and has chronic abdominal distention and nausea vomiting diarrhea. Recent prolonged admit 11/28-12/18- 2/2 recurrent Ogilvie's syndrome, severe malnutrition profound diarrhea> and discharged to SNF. In ED  afebrile and  spo2 mid 90s on  RA,  mild tachycardia and stable blood pressure. Labs-sodium 128, BUN 67, creatinine 1.96, normal WBC, troponin 49, and proBNP 10,122.  CT is concerning for distal large bowel obstruction with transition point at the proximal sigmoid colon. GI was consulted by the ED, s/p ivf 500 cc LR and Reglan , and  NG tube and rectal tube ordered. Managed with conservative treatment, NG clamped and subsequently removed 12/29  Assessment and plan  Recurrent Pseudo-obstruction of colon Ogilvie syndrome Recurrent abdominal pain Nausea/vomiting - S/P NG tube decompression, overall with some clinical improvement and NG tube removed 12/29 and diet started - s/p relistor  - continue rectal tube; copious liquid stool still  - last xray still marked distension as well as on exam; hyperpitched and tinkling sounds in abdomen still - Cardiology completing Mestinon  trial today; he may have benefited some as abdomen at least a little softer today - Does continue to at least have copious liquid stool leaking around rectal tube; unclear if seal is poor - continue trending K and Mag - tolerating diet so far as well   AKI on CKD stage  II Hypovolemic hyponatremia Hypokalemia Metabolic acidosis Recent urinary obstruction Patient has electrolyte balance in the setting of nausea vomiting and obstruction, volume depletion. Electrolytes overall stable, will replace potassium K-Dur 40 x1, and start 20meq bid  2/2 ogilvie syndrome Avoid nephrotoxic medication. Cont Flomax   CAD Ischemic cardiomyopathy Mildly elevated troponin but no chest pain - Continue Plavix   Chronic HFrEF Recent prolonged hospitalization complicated by cardiogenic shock with acute CHF with LAD stent stenosis 01/25/24: Last EF 20-25%, Global HKN on 02/21/24, proBNP at baseline 10,122-was 15,2454 in 06/26/24.   PTA on Jardiance , Aldactone ,recently digoxin  Entresto  was held due to softer blood pressure Cardiology following close- not to resume digoxin  on d/c  Atrial fibrillation on Eliquis  Was on heparin > switched to oral Eliquis  as taking po. Cont amiodarone , monitor in telemetry  History of PUD Continue PPI p.o  T2DM Last A1c normal 5.5 last month, blood sugar is stable  OSA CPAP intolerance   Thyroid  nodules; renal lesions  Indeterminate thyroid  nodules and b/l renal lesions noted incidentally on CT in ED  Outpatient follow-up with thyroid  US  and renal protocol MRI recommended   Goals of care Patient remains very high risk for readmission with complex medical comorbidities and at risk for acute decompensation, overall prognosis does not appear bright.He was seen by palliative care on his last admission-he is DNR limited with full scope of intervention.  Depression Anxiety Mood is stable, continue SSRI, prn Atarax   Deconditioning PT Orders: Active PT Follow up Rec: Home Health Pt (If Slow Progress, Might Need To Reconsider Snf)05/22/2024 1200   Interval History:  Abdomen is actually a  little bit softer, still distended. Ongoing output in the rectal tube.  Tolerating diet. Still has belching.  Antimicrobials:   DVT prophylaxis:    apixaban  (ELIQUIS ) tablet 5 mg   Code Status:   Code Status: Limited: Do not attempt resuscitation (DNR) -DNR-LIMITED -Do Not Intubate/DNI   Mobility Assessment (Last 72 Hours)     Mobility Assessment     Row Name 05/27/24 1700 05/27/24 1057 05/26/24 2138 05/26/24 1700 05/26/24 1100   Does the patient have exclusion criteria? -- No- Perform mobility assessment No- Perform mobility assessment -- No- Perform mobility assessment   What is the highest level of mobility based on the mobility assessment? Level 3 (Stands with assistance) - Balance while standing  and cannot march in place Level 3 (Stands with assistance) - Balance while standing  and cannot march in place Level 3 (Stands with assistance) - Balance while standing  and cannot march in place Level 3 (Stands with assistance) - Balance while standing  and cannot march in place Level 4 (Ambulates with assistance) - Balance while stepping forward/back - Complete   Is the above level different from baseline mobility prior to current illness? -- Yes - Recommend PT order Yes - Recommend PT order -- Yes - Recommend PT order    Row Name 05/25/24 2250 05/25/24 1600 05/25/24 1500 05/25/24 1200 05/25/24 0800   Does the patient have exclusion criteria? No- Perform mobility assessment No- Perform mobility assessment -- No- Perform mobility assessment No- Perform mobility assessment   What is the highest level of mobility based on the mobility assessment? Level 4 (Ambulates with assistance) - Balance while stepping forward/back - Complete Level 4 (Ambulates with assistance) - Balance while stepping forward/back - Complete Level 3 (Stands with assistance) - Balance while standing  and cannot march in place Level 4 (Ambulates with assistance) - Balance while stepping forward/back - Complete Level 4 (Ambulates with assistance) - Balance while stepping forward/back - Complete   Is the above level different from baseline mobility prior to current illness? Yes  - Recommend PT order Yes - Recommend PT order -- Yes - Recommend PT order Yes - Recommend PT order    Row Name 05/24/24 2045           Does the patient have exclusion criteria? No- Perform mobility assessment       What is the highest level of mobility based on the mobility assessment? Level 2 (Chairfast) - Balance while sitting on edge of bed and cannot stand       Is the above level different from baseline mobility prior to current illness? Yes - Recommend PT order          Diet: Diet Orders (From admission, onward)     Start     Ordered   05/23/24 1010  Diet Heart Room service appropriate? Yes; Fluid consistency: Thin  Diet effective now       Comments: soft  Question Answer Comment  Room service appropriate? Yes   Fluid consistency: Thin      05/23/24 1009            Barriers to discharge: none Disposition Plan:  Home HH orders placed: n/a Status is: Inpt  Objective: Blood pressure 109/69, pulse 75, temperature 97.9 F (36.6 C), temperature source Oral, resp. rate 15, height 6' (1.829 m), weight 75 kg, SpO2 96%.  Examination:  Physical Exam Constitutional:      General: He is not in acute distress.    Appearance: Normal  appearance.  HENT:     Head: Normocephalic and atraumatic.     Mouth/Throat:     Mouth: Mucous membranes are moist.  Eyes:     Extraocular Movements: Extraocular movements intact.  Cardiovascular:     Rate and Rhythm: Normal rate and regular rhythm.  Pulmonary:     Effort: Pulmonary effort is normal. No respiratory distress.     Breath sounds: Normal breath sounds. No wheezing.  Abdominal:     General: There is distension (very noticable).     Palpations: Abdomen is soft.     Tenderness: There is no abdominal tenderness.     Comments: Uncomfortable from distention but no overt pain.  Hyper pitched and tinkling bowel sounds.  Abdomen is mildly softer at least today, 1/3  Musculoskeletal:        General: Normal range of motion.      Cervical back: Normal range of motion and neck supple.  Skin:    General: Skin is warm and dry.  Neurological:     General: No focal deficit present.     Mental Status: He is alert.  Psychiatric:        Mood and Affect: Mood normal.        Behavior: Behavior normal.      Consultants:  GI Cardiology  Procedures:    Data Reviewed: Results for orders placed or performed during the hospital encounter of 05/19/24 (from the past 24 hours)  Basic metabolic panel with GFR     Status: Abnormal   Collection Time: 05/27/24  8:25 AM  Result Value Ref Range   Sodium 134 (L) 135 - 145 mmol/L   Potassium 4.4 3.5 - 5.1 mmol/L   Chloride 107 98 - 111 mmol/L   CO2 19 (L) 22 - 32 mmol/L   Glucose, Bld 88 70 - 99 mg/dL   BUN 15 8 - 23 mg/dL   Creatinine, Ser 9.29 0.61 - 1.24 mg/dL   Calcium  8.1 (L) 8.9 - 10.3 mg/dL   GFR, Estimated >39 >39 mL/min   Anion gap 9 5 - 15  Magnesium      Status: None   Collection Time: 05/27/24  8:25 AM  Result Value Ref Range   Magnesium  2.0 1.7 - 2.4 mg/dL    I have reviewed pertinent nursing notes, vitals, labs, and images as necessary. I have ordered labwork to follow up on as indicated.  I have reviewed the last notes from staff over past 24 hours. I have discussed patient's care plan and test results with nursing staff, CM/SW, and other staff as appropriate.  Old records reviewed in assessment of this patient  Time spent: Greater than 50% of the 55 minute visit was spent in counseling/coordination of care for the patient as laid out in the A&P.   LOS: 8 days   Alm Apo, MD [facility] 05/27/2024, 5:21 PM "

## 2024-05-27 NOTE — Plan of Care (Signed)
   Problem: Nutrition: Goal: Adequate nutrition will be maintained Outcome: Progressing   Problem: Coping: Goal: Level of anxiety will decrease Outcome: Progressing

## 2024-05-27 NOTE — Progress Notes (Signed)
 Changed patient again. Flushed rectal tube  and placed a new bag. Diarrhea continues to come out from around the tube. Bottom and surrounding skin cleaned and barrier cream applied.

## 2024-05-27 NOTE — Progress Notes (Addendum)
 "  Progress Note  Patient Name: Chase Combs Date of Encounter: 05/26/2024 [facility] Cardiologist: Alm Clay, MD   Interval Summary   Sitting up in bed eating breakfast.  Denies any CP or SOB.  Tele personally reviewed and demonstrates NSR with PVCs  Vital Signs Vitals:   05/25/24 1840 05/25/24 2025 05/25/24 2202 05/26/24 0430  BP: 102/63  101/70 (!) 90/58  Pulse: 81  81 81  Resp: 18  17 14   Temp:   (!) 97.5 F (36.4 C) 97.7 F (36.5 C)  TempSrc:      SpO2: 98% 99% 97% 97%  Weight:      Height:        Intake/Output Summary (Last 24 hours) at 05/26/2024 0844 Last data filed at 05/26/2024 9391 Gross per 24 hour  Intake 360 ml  Output 865 ml  Net -505 ml      05/25/2024    5:00 AM 05/24/2024    5:00 AM 05/23/2024    5:00 AM  Last 3 Weights  Weight (lbs) 166 lb 3.6 oz 164 lb 7.4 oz 165 lb 2 oz  Weight (kg) 75.4 kg 74.6 kg 74.9 kg      Telemetry/ECG  Normal sinus rhythm with heart rates in the 80s and 90s.  Wide QRS- Personally Reviewed  Physical Exam  GEN: Well nourished, well developed in no acute distress HEENT: Normal NECK: No JVD; No carotid bruits LYMPHATICS: No lymphadenopathy CARDIAC:RRR, no murmurs, rubs, gallops RESPIRATORY:  Clear to auscultation without rales, wheezing or rhonchi  ABDOMEN: Soft, non-tender, non-distended MUSCULOSKELETAL:  No edema; No deformity  SKIN: Warm and dry NEUROLOGIC:  Alert and oriented x 3 PSYCHIATRIC:  Normal affect   Assessment & Plan  Chase Combs is a 79 y.o. male with PMH of recurrent colonic pseudo-obstruction/Ogilvie syndrome, CKD stage II, chronic biventricular HFrEF due to ischemic cardiomyopathy, NSTEMI s/p DES to LAD (01/17/24), remote h/o STEMI s/p PCI with BMS to pRCA (2010), s/p PCI with BMS to pRI (2006), paroxysmal atrial flutter, HTN, HLD, diabetes mellitus type 2, OSA, PUD, prostate cancer who was seen for the evaluation of pyridostigmine  use.     Recurrent pseudoobstruction of colon Has had  recurrent abdominal pain, nausea, vomiting.  Previously had NG tube decompression.  Was recently placed on a diet and is tolerating. It was felt like the patient would tolerate pyridostigmine . Patient was started on pyridostigmine  on 05/24/2024 and plans to be stopped on 05/27/2024. Management per primary -no bradycardia on tele -plan is to stop pyridostigmine  today     Persistent atrial fibrillation The patient was recently placed on a diet.  Because of this IV heparin  was stopped and was started on Eliquis . He remains in NSR on tele this am with PVCs Continue Eliquis  5mg  BID Continue Amio 200mg  daily     Chronic biventricular HFrEF Prior TEE on 01/2024 showed a reduced LVEF of 20 to 25%, global hypokinesis, moderately reduced RV systolic function, moderately dilated left atrium, mild MR, and mild aortic valve calcification.  Patient's weight is stable at 75 kg's.   Wt this am remains stable at 75kg I&O's 1.9L out yesterday and net neg 184cc BP stable at 145/74mmHg this am but in the 90s over night and yesterday GDMT has been limited by hypotension and AKI this admit  SCr normalized but BP remains soft >> had been on Entresto  which was stopped 12/18 and Jardiance  and diuretics on hold>>  will hold off restarting until seen back in clinic give  persistent soft BPs     CAD s/p DES to LAD on 12/2023.  Also has a remote history of multiple PCI's. Denies any anginal symptoms Continue Plavix  75mg  daily Restart Crestor  20mg  daily      Otherwise management per primary  No new recommendations at this time.   [facility] will sign off.   Medication Recommendations:  Amiodarone  200mg  daily, Apixaban  5mg  BID, Plavix  75mg  daily, Crestor  20mg  daily Other recommendations (labs, testing, etc):  none Follow up as an outpatient:  Followup in 7-10 days in [facility] for management of his GDMT  I spent 30 minutes caring for this patient today face to face, ordering and reviewing labs,  reviewing records from 2D echo and TEE 01/2024 , seeing the patient, documenting in the record, and arranging for a close outpt followup    For questions or updates, please contact [facility] Please consult [email or web address] for contact info under       Chase Combs  8:20 AM  05/27/2024  "

## 2024-05-28 DIAGNOSIS — K5981 Ogilvie syndrome: Secondary | ICD-10-CM | POA: Diagnosis not present

## 2024-05-28 LAB — BASIC METABOLIC PANEL WITH GFR
Anion gap: 8 (ref 5–15)
BUN: 14 mg/dL (ref 8–23)
CO2: 22 mmol/L (ref 22–32)
Calcium: 8.1 mg/dL — ABNORMAL LOW (ref 8.9–10.3)
Chloride: 106 mmol/L (ref 98–111)
Creatinine, Ser: 0.71 mg/dL (ref 0.61–1.24)
GFR, Estimated: >60 mL/min
Glucose, Bld: 88 mg/dL (ref 70–99)
Potassium: 3.9 mmol/L (ref 3.5–5.1)
Sodium: 136 mmol/L (ref 135–145)

## 2024-05-28 LAB — MAGNESIUM: Magnesium: 1.8 mg/dL (ref 1.7–2.4)

## 2024-05-28 MED ORDER — MAGNESIUM SULFATE 2 GM/50ML IV SOLN
2.0000 g | Freq: Once | INTRAVENOUS | Status: AC
  Administered 2024-05-28: 2 g via INTRAVENOUS
  Filled 2024-05-28: qty 50

## 2024-05-28 NOTE — Progress Notes (Signed)
 Waiting on briefs

## 2024-05-28 NOTE — Progress Notes (Addendum)
 Patient ID: Chase Combs, male   DOB: March 11, 1946, 79 y.o.   MRN: [id number]    Progress Note   Subjective   Day # 8 CC; chronic colonic pseudoobstruction with exacerbation in setting of severe heart failure  Continues to do well on Mestinon  Rectal tube still in place this morning, waiting for depends patient feels that he should be able to get up and work with physical therapy, overall feeling better, tolerating solid diet no current complaints of abdominal pain  BMET-sodium 136/potassium 3.9/BUN 14/creatinine 7.1 Mg 1.8    Objective   Vital signs in last 24 hours: Temp:  [97.2 F (36.2 C)-98 F (36.7 C)] 97.2 F (36.2 C) (01/04 1137) Pulse Rate:  [71-80] 71 (01/04 1137) Resp:  [15-20] 15 (01/04 1137) BP: (90-116)/(59-74) 95/62 (01/04 1137) SpO2:  [94 %-97 %] 97 % (01/04 1137) Weight:  [74.8 kg] 74.8 kg (01/04 0500) Last BM Date : 05/26/24 General:   Well-developed elderly white male in NAD Heart:  Regular rate and rhythm; no murmurs Lungs: Respirations even and unlabored, lungs CTA bilaterally Abdomen:  Soft,, nontender, some palpable loops of bowel, bowel sounds are present, he has no focal tenderness.  Active bowel sounds, somewhat high-pitched and tinkling as before Extremities:  Without edema. Neurologic:  Alert and oriented,  grossly normal neurologically. Psych:  Cooperative. Normal mood and affect.  Intake/Output from previous day: 01/03 0701 - 01/04 0700 In: -  Out: 200 [Stool:200] Intake/Output this shift: Total I/O In: 360 [P.O.:360] Out: -   Lab Results: No results for input(s): WBC, HGB, HCT, PLT in the last 72 hours. BMET Recent Labs    05/26/24 0612 05/27/24 0825 05/28/24 0717  NA 133* 134* 136  K 4.2 4.4 3.9  CL 102 107 106  CO2 23 19* 22  GLUCOSE 136* 88 88  BUN 15 15 14   CREATININE 0.73 0.70 0.71  CALCIUM  8.6* 8.1* 8.1*   LFT No results for input(s): PROT, ALBUMIN, AST, ALT, ALKPHOS, BILITOT, BILIDIR, IBILI in  the last 72 hours. PT/INR No results for input(s): LABPROT, INR in the last 72 hours.      Assessment / Plan:    #50 79 year old white male with history of what appears to be chronic colonic pseudoobstruction day #8 of this admission admitted from rehab facility with exacerbation  Symptoms have not been quite as severe this admission as in the past  No real response to Relistor , MiraLAX  rectal tube etc.  Mestinon  initiated 72 hours ago, now on 30 mg twice daily and he seems to be having some response, no residual abdominal pain abdomen softer, tolerating solid diet without difficulty.  Stool is liquid in the rectal tube  #2 severe biventricular heart failure/secondary to ischemic cardiomyopathy #3 coronary artery disease status post MI 2025-on Plavix  #4 atrial fibrillation 5.  Diabetes mellitus 6.  Sleep apnea 7.  History of ischemic colitis August 2025 around the time of the MI  Plan continue soft diet Discontinue the rectal tube today we will see how he does with ability to have bowel movements on his own. Will continue Mestinon  30 mg p.o. twice daily for now. GI will continue to follow along Hopefully he can stay on a maintenance dose of Mestinon  on discharge, if he continues to do well.   Principal Problem:   Colonic pseudoobstruction Active Problems:   Coronary artery disease due to lipid rich plaque   Hypokalemia   Ogilvie syndrome   PUD (peptic ulcer disease)   Acute kidney injury  Paroxysmal atrial flutter (HCC)   Chronic combined systolic (congestive) and diastolic (congestive) heart failure (HCC)   Thyroid  nodule   Renal lesion   Abdominal distention     LOS: 9 days   Amy Esterwood PA-C 05/28/2024, 12:12 PM    I have taken an interval history, thoroughly reviewed the chart and examined the patient. I agree with the Advanced Practitioner's note, impression and recommendations, and have recorded additional findings, impressions and recommendations  below. I performed a substantive portion of this encounter (>50% time spent), including a complete performance of the medical decision making.  My additional thoughts are as follows:  Clinically stable and tolerating Mestinon  at current dose. He was anxious to get the rectal tube out today Needs further hospital observation Dr. Stacia will see tomorrow. _________________  This consultation required a moderate degree of medical decision making due to the nature and complexity of the acute condition(s) being evaluated as well as the patient's medical comorbidities.  Victory LITTIE Brand III Office:[phone]

## 2024-05-28 NOTE — Progress Notes (Signed)
 " Progress Note    Chase Combs   FMW:[id number]  DOB: 05-16-1946  DOA: 05/19/2024     9 PCP: Wonda Worth SQUIBB, PA  Initial CC: Abdominal distention  Hospital Course: Chase Combs is a 79 y.o. 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  He has not been able to keep oral intake and has chronic abdominal distention and nausea vomiting diarrhea. Recent prolonged admit 11/28-12/18- 2/2 recurrent Ogilvie's syndrome, severe malnutrition profound diarrhea> and discharged to SNF. In ED  afebrile and  spo2 mid 90s on  RA,  mild tachycardia and stable blood pressure. Labs-sodium 128, BUN 67, creatinine 1.96, normal WBC, troponin 49, and proBNP 10,122.  CT is concerning for distal large bowel obstruction with transition point at the proximal sigmoid colon. GI was consulted by the ED, s/p ivf 500 cc LR and Reglan , and  NG tube and rectal tube ordered. Managed with conservative treatment, NG clamped and subsequently removed 12/29  Assessment and plan  Recurrent Pseudo-obstruction of colon Ogilvie syndrome Recurrent abdominal pain Nausea/vomiting - S/P NG tube decompression, overall with some clinical improvement and NG tube removed 12/29 and diet started - s/p relistor  - last xray still marked distension as well as on exam; hyperpitched and tinkling sounds in abdomen still - continue trending K and Mag - tolerating diet so far as well  - Awaiting briefs in order to discontinue rectal tube and help with ambulation as well - GI continuing on mestinon  course; patient does seem to have softer abdomen and better bowel sounds the past 48 hours   AKI on CKD stage II Hypovolemic hyponatremia Hypokalemia Metabolic acidosis Recent  urinary obstruction -Will continue replating electrolytes as necessary - Maintaining adequate intake on diet -No longer needing IV fluids  CAD Ischemic cardiomyopathy Mildly elevated troponin but no chest pain - Continue Plavix   Chronic HFrEF Recent prolonged hospitalization complicated by cardiogenic shock with acute CHF with LAD stent stenosis 01/25/24: Last EF 20-25%, Global HKN on 02/21/24, proBNP at baseline 10,122-was 15,2454 in 06/26/24.   PTA on Jardiance , Aldactone ,recently digoxin  Entresto  was held due to softer blood pressure Cardiology following close- not to resume digoxin  on d/c  Atrial fibrillation on Eliquis  Was on heparin > switched to oral Eliquis  as taking po. Cont amiodarone , monitor in telemetry  History of PUD Continue PPI p.o  T2DM Last A1c normal 5.5 last month, blood sugar is stable  OSA CPAP intolerance   Thyroid  nodules; renal lesions  Indeterminate thyroid  nodules and b/l renal lesions noted incidentally on CT in ED  Outpatient follow-up with thyroid  US  and renal protocol MRI recommended   Goals of care Patient remains very high risk for readmission with complex medical comorbidities and at risk for acute decompensation, overall prognosis does not appear bright.He was seen by palliative care on his last admission-he is DNR limited with full scope of intervention.  Depression Anxiety Mood is stable, continue SSRI, prn Atarax   Deconditioning PT Orders: Active PT Follow up Rec: Home Health Pt (If Slow Progress, Might Need To Reconsider Snf)05/22/2024 1200   Interval History:  Abdomen continues to feel softer especially compared to yesterday.  Bowel sounds are also turning more regular instead of high-pitched/tinkling. Rectal tube still in place as they are waiting on adult briefs before  pulling it out.   Antimicrobials:   DVT prophylaxis:   apixaban  (ELIQUIS ) tablet 5 mg   Code Status:   Code Status: Limited: Do not attempt resuscitation (DNR)  -DNR-LIMITED -Do Not Intubate/DNI   Mobility Assessment (Last 72 Hours)     Mobility Assessment     Row Name 05/28/24 0830 05/27/24 2113 05/27/24 1700 05/27/24 1057 05/27/24 0213   Does the patient have exclusion criteria? -- No- Perform mobility assessment -- No- Perform mobility assessment --   What is the highest level of mobility based on the mobility assessment? Level 1 (Bedfast) - Unable to balance while sitting on edge of bed Level 3 (Stands with assistance) - Balance while standing  and cannot march in place Level 3 (Stands with assistance) - Balance while standing  and cannot march in place Level 3 (Stands with assistance) - Balance while standing  and cannot march in place --   Is the above level different from baseline mobility prior to current illness? -- Yes - Recommend PT order -- Yes - Recommend PT order --    Row Name 05/26/24 2138 05/26/24 1700 05/26/24 1100 05/25/24 2250 05/25/24 1600   Does the patient have exclusion criteria? No- Perform mobility assessment -- No- Perform mobility assessment No- Perform mobility assessment No- Perform mobility assessment   What is the highest level of mobility based on the mobility assessment? Level 3 (Stands with assistance) - Balance while standing  and cannot march in place Level 3 (Stands with assistance) - Balance while standing  and cannot march in place Level 4 (Ambulates with assistance) - Balance while stepping forward/back - Complete Level 4 (Ambulates with assistance) - Balance while stepping forward/back - Complete Level 4 (Ambulates with assistance) - Balance while stepping forward/back - Complete   Is the above level different from baseline mobility prior to current illness? Yes - Recommend PT order -- Yes - Recommend PT order Yes - Recommend PT order Yes - Recommend PT order    Row Name 05/25/24 1500           What is the highest level of mobility based on the mobility assessment? Level 3 (Stands with assistance) - Balance  while standing  and cannot march in place          Diet: Diet Orders (From admission, onward)     Start     Ordered   05/23/24 1010  Diet Heart Room service appropriate? Yes; Fluid consistency: Thin  Diet effective now       Comments: soft  Question Answer Comment  Room service appropriate? Yes   Fluid consistency: Thin      05/23/24 1009            Barriers to discharge: none Disposition Plan:  Home HH orders placed: n/a Status is: Inpt  Objective: Blood pressure 95/62, pulse 71, temperature (!) 97.2 F (36.2 C), resp. rate 15, height 6' (1.829 m), weight 74.8 kg, SpO2 97%.  Examination:  Physical Exam Constitutional:      General: He is not in acute distress.    Appearance: Normal appearance.  HENT:     Head: Normocephalic and atraumatic.     Mouth/Throat:     Mouth: Mucous membranes are moist.  Eyes:     Extraocular Movements: Extraocular movements intact.  Cardiovascular:     Rate and Rhythm: Normal rate and regular rhythm.  Pulmonary:     Effort: Pulmonary effort is normal. No respiratory distress.     Breath sounds:  Normal breath sounds. No wheezing.  Abdominal:     General: There is distension.     Palpations: Abdomen is soft.     Tenderness: There is no abdominal tenderness.     Comments: Remains distended but finally becoming softer over past 48 hours and bowel sounds transitioning from hyper pitched/tinkling to more traditional sounds  Musculoskeletal:        General: Normal range of motion.     Cervical back: Normal range of motion and neck supple.  Skin:    General: Skin is warm and dry.  Neurological:     General: No focal deficit present.     Mental Status: He is alert.  Psychiatric:        Mood and Affect: Mood normal.        Behavior: Behavior normal.      Consultants:  GI Cardiology  Procedures:    Data Reviewed: Results for orders placed or performed during the hospital encounter of 05/19/24 (from the past 24 hours)  Basic  metabolic panel with GFR     Status: Abnormal   Collection Time: 05/28/24  7:17 AM  Result Value Ref Range   Sodium 136 135 - 145 mmol/L   Potassium 3.9 3.5 - 5.1 mmol/L   Chloride 106 98 - 111 mmol/L   CO2 22 22 - 32 mmol/L   Glucose, Bld 88 70 - 99 mg/dL   BUN 14 8 - 23 mg/dL   Creatinine, Ser 9.28 0.61 - 1.24 mg/dL   Calcium  8.1 (L) 8.9 - 10.3 mg/dL   GFR, Estimated >39 >39 mL/min   Anion gap 8 5 - 15  Magnesium      Status: None   Collection Time: 05/28/24  7:17 AM  Result Value Ref Range   Magnesium  1.8 1.7 - 2.4 mg/dL    I have reviewed pertinent nursing notes, vitals, labs, and images as necessary. I have ordered labwork to follow up on as indicated.  I have reviewed the last notes from staff over past 24 hours. I have discussed patient's care plan and test results with nursing staff, CM/SW, and other staff as appropriate.  Old records reviewed in assessment of this patient  Time spent: Greater than 50% of the 55 minute visit was spent in counseling/coordination of care for the patient as laid out in the A&P.   LOS: 9 days   Alm Apo, MD [facility] 05/28/2024, 1:28 PM "

## 2024-05-28 NOTE — Plan of Care (Signed)

## 2024-05-29 DIAGNOSIS — K5989 Other specified functional intestinal disorders: Secondary | ICD-10-CM | POA: Diagnosis not present

## 2024-05-29 DIAGNOSIS — K5981 Ogilvie syndrome: Secondary | ICD-10-CM | POA: Diagnosis not present

## 2024-05-29 DIAGNOSIS — I959 Hypotension, unspecified: Secondary | ICD-10-CM | POA: Diagnosis not present

## 2024-05-29 LAB — MAGNESIUM: Magnesium: 2.3 mg/dL (ref 1.7–2.4)

## 2024-05-29 LAB — BASIC METABOLIC PANEL WITH GFR
Anion gap: 7 (ref 5–15)
BUN: 16 mg/dL (ref 8–23)
CO2: 23 mmol/L (ref 22–32)
Calcium: 8 mg/dL — ABNORMAL LOW (ref 8.9–10.3)
Chloride: 106 mmol/L (ref 98–111)
Creatinine, Ser: 0.79 mg/dL (ref 0.61–1.24)
GFR, Estimated: >60 mL/min
Glucose, Bld: 108 mg/dL — ABNORMAL HIGH (ref 70–99)
Potassium: 4.6 mmol/L (ref 3.5–5.1)
Sodium: 136 mmol/L (ref 135–145)

## 2024-05-29 MED ORDER — POTASSIUM CHLORIDE 20 MEQ PO PACK
20.0000 meq | PACK | Freq: Two times a day (BID) | ORAL | Status: DC
  Administered 2024-05-29 – 2024-05-30 (×4): 20 meq via ORAL
  Filled 2024-05-29 (×4): qty 1

## 2024-05-29 NOTE — Progress Notes (Signed)
 [facility] ROUNDING NOTE   Subjective: Patient feels a little better today.  Appetite continues to be good.  He has passed about 3 loose stools today.  Has minimal abdominal discomfort.  No nausea or vomiting. Glad to have rectal tube out.   Objective: Vital signs in last 24 hours: Temp:  [98.2 F (36.8 C)-98.4 F (36.9 C)] 98.2 F (36.8 C) (01/05 1709) Pulse Rate:  [67-76] 76 (01/05 1709) Resp:  [17] 17 (01/05 1709) BP: (93-108)/(63-76) 104/63 (01/05 1709) SpO2:  [97 %] 97 % (01/05 1709) Weight:  [74.8 kg] 74.8 kg (01/05 0500) Last BM Date : 05/28/24 General: NAD, pleasant Caucasian male, wife at bedside Lungs:  CTA b/l, no w/r/r Heart:  RRR, no m/r/g Abdomen:  Soft, distended, high-pitched/tinkling bowel sounds, +BS, nontender Ext:  No c/c/e    Intake/Output from previous day: 01/04 0701 - 01/05 0700 In: 840 [P.O.:840] Out: 350 [Urine:350] Intake/Output this shift: No intake/output data recorded.   Lab Results: No results for input(s): WBC, HGB, PLT, MCV in the last 72 hours. BMET Recent Labs    05/27/24 0825 05/28/24 0717 05/29/24 0444  NA 134* 136 136  K 4.4 3.9 4.6  CL 107 106 106  CO2 19* 22 23  GLUCOSE 88 88 108*  BUN 15 14 16   CREATININE 0.70 0.71 0.79  CALCIUM  8.1* 8.1* 8.0*   LFT No results for input(s): PROT, ALBUMIN, AST, ALT, ALKPHOS, BILITOT, BILIDIR, IBILI in the last 72 hours. PT/INR No results for input(s): INR in the last 72 hours.    Imaging/Other results: No results found.    Assessment and Plan:  79 year old male with extensive cardiac history and recurrent colonic pseudoobstruction with multiple admissions for the same, admitted for worsening abdominal distention, consistent with recurrent colonic pseudoobstruction. Currently appears comfortable, tolerating p.o. without any nausea or vomiting.  Abdominal distention and discomfort are significantly improved compared to admission. Mestinon   was started December 31 and it appears to be helping.  Chronic colonic pseudoobstruction - Improving with Mestinon  30 mg twice daily.  Although there is no standard dosing for Mestinon  in this clinical scenario (clinical studies have various dosages used), I suggested maintaining his current dose, as long as there is continued clinical improvement, and to consider increasing doses when his symptoms plateau or worsen.  We discussed the potential barriers of obtaining this medication as an outpatient based on insurance approval/cost.  Currently denies any anticholinergic side effects. - Continue soft diet for now - Continue to monitor renal function and electrolytes  I spent a total of 35 minutes reviewing the patient's medical record, interviewing and examining the patient, discussing his diagnosis and management of his condition going forward, and documenting in the medical record   [name], MD  05/29/2024, 9:43 PM [facility]

## 2024-05-29 NOTE — Plan of Care (Signed)

## 2024-05-29 NOTE — Progress Notes (Signed)
 " Progress Note    ICHAEL Combs   FMW:[id number]  DOB: 02-25-1946  DOA: 05/19/2024     10 PCP: Wonda Worth SQUIBB, PA  Initial CC: Abdominal distention  Hospital Course: Chase Combs is a 79 y.o. 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  He has not been able to keep oral intake and has chronic abdominal distention and nausea vomiting diarrhea. Recent prolonged admit 11/28-12/18- 2/2 recurrent Ogilvie's syndrome, severe malnutrition profound diarrhea> and discharged to SNF. In ED  afebrile and  spo2 mid 90s on  RA,  mild tachycardia and stable blood pressure. Labs-sodium 128, BUN 67, creatinine 1.96, normal WBC, troponin 49, and proBNP 10,122.  CT is concerning for distal large bowel obstruction with transition point at the proximal sigmoid colon. GI was consulted by the ED, s/p ivf 500 cc LR and Reglan , and  NG tube and rectal tube ordered. Managed with conservative treatment, NG clamped and subsequently removed 12/29  Assessment and plan  Recurrent Pseudo-obstruction of colon Ogilvie syndrome Recurrent abdominal pain Nausea/vomiting - S/P NG tube decompression, overall with some clinical improvement and NG tube removed 12/29 and diet started - s/p relistor  - last xray still marked distension as well as on exam; hyperpitched and tinkling sounds in abdomen still - continue trending K and Mag - tolerating diet so far as well  - Awaiting briefs in order to discontinue rectal tube and help with ambulation as well - GI continuing on mestinon  course; patient does seem to have softer abdomen  AKI on CKD stage II Hypovolemic hyponatremia Hypokalemia Metabolic acidosis Recent urinary obstruction -Will continue replating  electrolytes as necessary - Maintaining adequate intake on diet -No longer needing IV fluids  CAD Ischemic cardiomyopathy Mildly elevated troponin but no chest pain - Continue Plavix   Chronic HFrEF Recent prolonged hospitalization complicated by cardiogenic shock with acute CHF with LAD stent stenosis 01/25/24: Last EF 20-25%, Global HKN on 02/21/24, proBNP at baseline 10,122-was 15,2454 in 06/26/24.   PTA on Jardiance , Aldactone ,recently digoxin  Entresto  was held due to softer blood pressure Cardiology following close- not to resume digoxin  on d/c  Atrial fibrillation on Eliquis  Was on heparin > switched to oral Eliquis  as taking po. Cont amiodarone , monitor in telemetry  History of PUD Continue PPI p.o  T2DM Last A1c normal 5.5 last month, blood sugar is stable  OSA CPAP intolerance   Thyroid  nodules; renal lesions  Indeterminate thyroid  nodules and b/l renal lesions noted incidentally on CT in ED  Outpatient follow-up with thyroid  US  and renal protocol MRI recommended   Goals of care Patient remains very high risk for readmission with complex medical comorbidities and at risk for acute decompensation, overall prognosis does not appear bright.He was seen by palliative care on his last admission-he is DNR limited with full scope of intervention.  Depression Anxiety Mood is stable, continue SSRI, prn Atarax   Deconditioning PT Orders: Active PT Follow up Rec: Home Health Pt (If Slow Progress, Might Need To Reconsider Snf)05/22/2024 1200   Interval History:  Abd still soft. Little more tinkling of sounds today however. Rectal tube out. Brief on, but only has 2.   Antimicrobials:   DVT prophylaxis:   apixaban  (ELIQUIS ) tablet 5 mg   Code Status:   Code Status: Limited:  Do not attempt resuscitation (DNR) -DNR-LIMITED -Do Not Intubate/DNI   Mobility Assessment (Last 72 Hours)     Mobility Assessment     Row Name 05/28/24 2221 05/28/24 0830 05/27/24 2113 05/27/24 1700  05/27/24 1057   Does the patient have exclusion criteria? No- Perform mobility assessment -- No- Perform mobility assessment -- No- Perform mobility assessment   What is the highest level of mobility based on the mobility assessment? Level 3 (Stands with assistance) - Balance while standing  and cannot march in place Level 1 (Bedfast) - Unable to balance while sitting on edge of bed Level 3 (Stands with assistance) - Balance while standing  and cannot march in place Level 3 (Stands with assistance) - Balance while standing  and cannot march in place Level 3 (Stands with assistance) - Balance while standing  and cannot march in place   Is the above level different from baseline mobility prior to current illness? Yes - Recommend PT order -- Yes - Recommend PT order -- Yes - Recommend PT order    Row Name 05/27/24 9786 05/26/24 2138 05/26/24 1700       Does the patient have exclusion criteria? -- No- Perform mobility assessment --     What is the highest level of mobility based on the mobility assessment? -- Level 3 (Stands with assistance) - Balance while standing  and cannot march in place Level 3 (Stands with assistance) - Balance while standing  and cannot march in place     Is the above level different from baseline mobility prior to current illness? -- Yes - Recommend PT order --        Diet: Diet Orders (From admission, onward)     Start     Ordered   05/23/24 1010  Diet Heart Room service appropriate? Yes; Fluid consistency: Thin  Diet effective now       Comments: soft  Question Answer Comment  Room service appropriate? Yes   Fluid consistency: Thin      05/23/24 1009            Barriers to discharge: none Disposition Plan:  Home HH orders placed: n/a Status is: Inpt  Objective: Blood pressure 108/76, pulse 67, temperature 98.4 F (36.9 C), resp. rate 17, height 6' (1.829 m), weight 74.8 kg, SpO2 97%.  Examination:  Physical Exam Constitutional:      General: He is  not in acute distress.    Appearance: Normal appearance.  HENT:     Head: Normocephalic and atraumatic.     Mouth/Throat:     Mouth: Mucous membranes are moist.  Eyes:     Extraocular Movements: Extraocular movements intact.  Cardiovascular:     Rate and Rhythm: Normal rate and regular rhythm.  Pulmonary:     Effort: Pulmonary effort is normal. No respiratory distress.     Breath sounds: Normal breath sounds. No wheezing.  Abdominal:     General: There is distension.     Palpations: Abdomen is soft.     Tenderness: There is no abdominal tenderness.     Comments: Remains distended but finally becoming softer over past 48 hours and bowel sounds transitioning from hyper pitched/tinkling to more traditional sounds  Musculoskeletal:        General: Normal range of motion.     Cervical back: Normal range of motion and neck supple.  Skin:    General: Skin is warm and dry.  Neurological:     General: No focal deficit present.  Mental Status: He is alert.  Psychiatric:        Mood and Affect: Mood normal.        Behavior: Behavior normal.      Consultants:  GI Cardiology  Procedures:    Data Reviewed: Results for orders placed or performed during the hospital encounter of 05/19/24 (from the past 24 hours)  Magnesium      Status: None   Collection Time: 05/29/24  4:44 AM  Result Value Ref Range   Magnesium  2.3 1.7 - 2.4 mg/dL  Basic metabolic panel with GFR     Status: Abnormal   Collection Time: 05/29/24  4:44 AM  Result Value Ref Range   Sodium 136 135 - 145 mmol/L   Potassium 4.6 3.5 - 5.1 mmol/L   Chloride 106 98 - 111 mmol/L   CO2 23 22 - 32 mmol/L   Glucose, Bld 108 (H) 70 - 99 mg/dL   BUN 16 8 - 23 mg/dL   Creatinine, Ser 9.20 0.61 - 1.24 mg/dL   Calcium  8.0 (L) 8.9 - 10.3 mg/dL   GFR, Estimated >39 >39 mL/min   Anion gap 7 5 - 15    I have reviewed pertinent nursing notes, vitals, labs, and images as necessary. I have ordered labwork to follow up on as  indicated.  I have reviewed the last notes from staff over past 24 hours. I have discussed patient's care plan and test results with nursing staff, CM/SW, and other staff as appropriate.  Old records reviewed in assessment of this patient  Time spent: Greater than 50% of the 55 minute visit was spent in counseling/coordination of care for the patient as laid out in the A&P.   LOS: 10 days   Alm Apo, MD [facility] 05/29/2024, 2:19 PM "

## 2024-05-30 DIAGNOSIS — K5981 Ogilvie syndrome: Secondary | ICD-10-CM | POA: Diagnosis not present

## 2024-05-30 DIAGNOSIS — K5989 Other specified functional intestinal disorders: Secondary | ICD-10-CM | POA: Diagnosis not present

## 2024-05-30 DIAGNOSIS — I959 Hypotension, unspecified: Secondary | ICD-10-CM | POA: Diagnosis not present

## 2024-05-30 MED ORDER — PYRIDOSTIGMINE BROMIDE 60 MG/5ML PO SOLN
30.0000 mg | Freq: Two times a day (BID) | ORAL | Status: DC
  Administered 2024-05-30 – 2024-05-31 (×4): 30 mg via ORAL
  Filled 2024-05-30 (×5): qty 2.5

## 2024-05-30 NOTE — Progress Notes (Signed)
 " Progress Note    Chase Combs   FMW:[id number]  DOB: 1946-04-20  DOA: 05/19/2024     11 PCP: Wonda Worth SQUIBB, PA  Initial CC: Abdominal distention  Hospital Course: Chase Combs is a 79 y.o. 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  He has not been able to keep oral intake and has chronic abdominal distention and nausea vomiting diarrhea. Recent prolonged admit 11/28-12/18- 2/2 recurrent Ogilvie's syndrome, severe malnutrition profound diarrhea> and discharged to SNF. In ED  afebrile and  spo2 mid 90s on  RA,  mild tachycardia and stable blood pressure. Labs-sodium 128, BUN 67, creatinine 1.96, normal WBC, troponin 49, and proBNP 10,122.  CT is concerning for distal large bowel obstruction with transition point at the proximal sigmoid colon. GI was consulted by the ED, s/p ivf 500 cc LR and Reglan , and  NG tube and rectal tube ordered. Managed with conservative treatment, NG clamped and subsequently removed 12/29  Assessment and plan  Recurrent Pseudo-obstruction of colon Ogilvie syndrome Recurrent abdominal pain Nausea/vomiting - S/P NG tube decompression, overall with some clinical improvement and NG tube removed 12/29 and diet started - s/p relistor  - last xray still marked distension as well as on exam; hyperpitched and tinkling sounds in abdomen still - continue trending K and Mag - tolerating diet so far as well  -rectal tube now out; having multiple BM in briefs and bed still; needs ongoing PT in order to regain some strength so he can get to bathroom more frequently and easily also  - GI continuing on mestinon  course; patient does seem to have softer abdomen  AKI on CKD stage II Hypovolemic  hyponatremia Hypokalemia Metabolic acidosis Recent urinary obstruction -Will continue replating electrolytes as necessary - Maintaining adequate intake on diet -No longer needing IV fluids  CAD Ischemic cardiomyopathy Mildly elevated troponin but no chest pain - Continue Plavix   Chronic HFrEF Recent prolonged hospitalization complicated by cardiogenic shock with acute CHF with LAD stent stenosis 01/25/24: Last EF 20-25%, Global HKN on 02/21/24, proBNP at baseline 10,122-was 15,2454 in 06/26/24.   PTA on Jardiance , Aldactone ,recently digoxin  Entresto  was held due to softer blood pressure Cardiology following close- not to resume digoxin  on d/c  Atrial fibrillation on Eliquis  Was on heparin > switched to oral Eliquis  as taking po. Cont amiodarone , monitor in telemetry  History of PUD Continue PPI p.o  T2DM Last A1c normal 5.5 last month, blood sugar is stable  OSA CPAP intolerance   Thyroid  nodules; renal lesions  Indeterminate thyroid  nodules and b/l renal lesions noted incidentally on CT in ED  Outpatient follow-up with thyroid  US  and renal protocol MRI recommended   Goals of care Patient remains very high risk for readmission with complex medical comorbidities and at risk for acute decompensation, overall prognosis does not appear bright.He was seen by palliative care on his last admission-he is DNR limited with full scope of intervention.  Depression Anxiety Mood is stable, continue SSRI, prn Atarax   Deconditioning PT Orders: Active PT Follow up Rec: Home Health Pt (If Spouse Unable To Provide Required Physical Assist, Could Consider Snf)05/27/2024 1700   Interval History:   Mestinon  not given yesterday as orders somehow fell off.  Reordered today.  Still having multiple loose stools and unable  to control at times. He is deconditioned. Not much change with abdomen, still distended with hyper pitched bowel sounds.   Antimicrobials:   DVT prophylaxis:   apixaban   (ELIQUIS ) tablet 5 mg   Code Status:   Code Status: Limited: Do not attempt resuscitation (DNR) -DNR-LIMITED -Do Not Intubate/DNI   Mobility Assessment (Last 72 Hours)     Mobility Assessment     Row Name 05/30/24 0355 05/28/24 2221 05/28/24 0830 05/27/24 2113 05/27/24 1700   Does the patient have exclusion criteria? No- Perform mobility assessment No- Perform mobility assessment -- No- Perform mobility assessment --   What is the highest level of mobility based on the mobility assessment? Level 3 (Stands with assistance) - Balance while standing  and cannot march in place Level 3 (Stands with assistance) - Balance while standing  and cannot march in place Level 1 (Bedfast) - Unable to balance while sitting on edge of bed Level 3 (Stands with assistance) - Balance while standing  and cannot march in place Level 3 (Stands with assistance) - Balance while standing  and cannot march in place   Is the above level different from baseline mobility prior to current illness? Yes - Recommend PT order Yes - Recommend PT order -- Yes - Recommend PT order --      Diet: Diet Orders (From admission, onward)     Start     Ordered   05/23/24 1010  Diet Heart Room service appropriate? Yes; Fluid consistency: Thin  Diet effective now       Comments: soft  Question Answer Comment  Room service appropriate? Yes   Fluid consistency: Thin      05/23/24 1009            Barriers to discharge: none Disposition Plan:  Home HH orders placed: n/a Status is: Inpt  Objective: Blood pressure 100/68, pulse 79, temperature 97.8 F (36.6 C), temperature source Oral, resp. rate 15, height 6' (1.829 m), weight 72.5 kg, SpO2 97%.  Examination:  Physical Exam Constitutional:      General: He is not in acute distress.    Appearance: Normal appearance.  HENT:     Head: Normocephalic and atraumatic.     Mouth/Throat:     Mouth: Mucous membranes are moist.  Eyes:     Extraocular Movements: Extraocular  movements intact.  Cardiovascular:     Rate and Rhythm: Normal rate and regular rhythm.  Pulmonary:     Effort: Pulmonary effort is normal. No respiratory distress.     Breath sounds: Normal breath sounds. No wheezing.  Abdominal:     General: There is distension.     Palpations: Abdomen is soft.     Tenderness: There is no abdominal tenderness.     Comments: Remains distended but finally becoming softer over past 48 hours.  Bowel sounds rotating some between hyperpitched and tinkling compared to more traditional at times  Musculoskeletal:        General: Normal range of motion.     Cervical back: Normal range of motion and neck supple.  Skin:    General: Skin is warm and dry.  Neurological:     General: No focal deficit present.     Mental Status: He is alert.  Psychiatric:        Mood and Affect: Mood normal.        Behavior: Behavior normal.      Consultants:  GI Cardiology-signed off  Procedures:    Data Reviewed: No results  found for this or any previous visit (from the past 24 hours).   I have reviewed pertinent nursing notes, vitals, labs, and images as necessary. I have ordered labwork to follow up on as indicated.  I have reviewed the last notes from staff over past 24 hours. I have discussed patient's care plan and test results with nursing staff, CM/SW, and other staff as appropriate.  Old records reviewed in assessment of this patient  Time spent: Greater than 50% of the 55 minute visit was spent in counseling/coordination of care for the patient as laid out in the A&P.   LOS: 11 days   Alm Apo, MD [facility] 05/30/2024, 12:38 PM "

## 2024-05-30 NOTE — Progress Notes (Signed)
 Occupational Therapy Treatment Patient Details Name: Chase Combs MRN: [id number] DOB: 1946/04/12 Today's Date: 05/30/2024   History of present illness Pt is a 79 y.o male admitted from [facility] for N/V/D. 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 Symptomatic orthostatic hypotension 12/31 during PT session. PMH: NSTEMI, CAD, heart failure, a fib, HTN, HLD, DMII, CKD, OSA, prostate cancer, Ogilvie syndrome   OT comments  Pt progressing towards goals. Focus of session on progressing functional mobility and increasing activity tolerance and endurance to facilitate safe engagement in ADL tasks OOB. Pt required Max A LB dressing and CGA for functional transfers. BP in standing 49/38 (44) with c/o light dizziness. TED hose on. Primary nurse notified. Pt returned to supine. Educated Pt on strategies to increase BP following drops, Pt verbalizing understanding. OT to continue to follow Pt acutely to facilitate progress towards goals. Continue per POC.       If plan is discharge home, recommend the following:  A lot of help with walking and/or transfers;A lot of help with bathing/dressing/bathroom;Assistance with cooking/housework;Assist for transportation   Equipment Recommendations  None recommended by OT    Recommendations for Other Services      Precautions / Restrictions Precautions Precautions: Fall;Other (comment) Recall of Precautions/Restrictions: Intact Precaution/Restrictions Comments: watch BP, rectal pouch Restrictions Weight Bearing Restrictions Per Provider Order: No       Mobility Bed Mobility Overal bed mobility: Needs Assistance Bed Mobility: Supine to Sit, Sit to Supine     Supine to sit: Supervision Sit to supine: Supervision   General bed mobility comments: Supervision for safety to complete bed mobility. Pt requesting foot board be at shortest position against feet.    Transfers Overall transfer level: Needs assistance Equipment used: Rolling  walker (2 wheels) Transfers: Sit to/from Stand Sit to Stand: Contact guard assist           General transfer comment: CGA for sit to stand. BP taken in standing, 49/38 (44). Pt with c/o dizziness. Returned to supine and RN notified     Balance Overall balance assessment: Mild deficits observed, not formally tested                                         ADL either performed or assessed with clinical judgement   ADL Overall ADL's : Needs assistance/impaired                     Lower Body Dressing: Maximal assistance;Bed level Lower Body Dressing Details (indicate cue type and reason): Total A to don TED hose bed level,               General ADL Comments: respectfully declined ADLs this date    Extremity/Trunk Assessment Upper Extremity Assessment Upper Extremity Assessment: RUE deficits/detail RUE Deficits / Details: Pt with history of R shoulder impairments, states he needs TSA, overall WFLs for basic ADLs. Pt with 79 y.o. 3rd distal phalange valgus injury/dislocation. RUE: Shoulder pain with ROM RUE Sensation: WNL RUE Coordination: WNL            Vision       Perception     Praxis     Communication Communication Communication: Impaired Factors Affecting Communication: Hearing impaired   Cognition Arousal: Alert Behavior During Therapy: WFL for tasks assessed/performed Cognition: No apparent impairments             OT -  Cognition Comments: Pt minimally agitated regarding drops in BP and slow progress.                 Following commands: Intact        Cueing   Cueing Techniques: Verbal cues  Exercises      Shoulder Instructions       General Comments BP in standing 49/38 (44) with c/o light dizziness. TED hose on. Primary nurse notified. Pt returned to supine with all needs met.    Pertinent Vitals/ Pain       Pain Assessment Pain Assessment: No/denies pain  Home Living                                           Prior Functioning/Environment              Frequency  Min 2X/week        Progress Toward Goals  OT Goals(current goals can now be found in the care plan section)  Progress towards OT goals: Progressing toward goals     Plan      Co-evaluation                 AM-PAC OT 6 Clicks Daily Activity     Outcome Measure   Help from another person eating meals?: None Help from another person taking care of personal grooming?: A Little Help from another person toileting, which includes using toliet, bedpan, or urinal?: A Lot Help from another person bathing (including washing, rinsing, drying)?: A Lot Help from another person to put on and taking off regular upper body clothing?: A Little Help from another person to put on and taking off regular lower body clothing?: A Lot 6 Click Score: 16    End of Session Equipment Utilized During Treatment: Gait belt;Rolling walker (2 wheels)  OT Visit Diagnosis: Other abnormalities of gait and mobility (R26.89);Muscle weakness (generalized) (M62.81);Unsteadiness on feet (R26.81)   Activity Tolerance Other (comment) (Limited by BP, otherwise tolerated well)   Patient Left in bed;with call bell/phone within reach;with bed alarm set   Nurse Communication Other (comment) (BP)        Time: 8564-8495 OT Time Calculation (min): 29 min  Charges: OT General Charges $OT Visit: 1 Visit OT Treatments $Therapeutic Activity: 23-37 mins  Maurilio CROME, OTR/L.  [facility] Acute Rehabilitation  Office: [phone]   Maurilio PARAS Harveer Sadler 05/30/2024, 4:41 PM

## 2024-05-30 NOTE — Progress Notes (Signed)
 Physical Therapy Treatment Patient Details Name: Chase Combs MRN: [id number] DOB: 1946/03/08 Today's Date: 05/30/2024   History of Present Illness Pt is a 79 y.o male admitted from [facility] for N/V/D. 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 Symptomatic orthostatic hypotension 12/31 during PT session. PMH: NSTEMI, CAD, heart failure, a fib, HTN, HLD, DMII, CKD, OSA, prostate cancer, Ogilvie syndrome    PT Comments  Pt received in supine, agreeable to therapy session, with good participation as able. Standing tolerance limited due to symptomatic orthostatic hypotension, so worked on pre-gait standing exercises and reciprocal sit<>stand for LE strengthening and to build endurance. Pt defers OOB to chair due to discomfort in recliner, pt agreeable to try sitting in regular chair tomorrow if staff will allow this, will see if nursing team can find him alternate recliner. Plan to work on wheelchair mobility next session as pt has been consistently orthostatic and may be safer to mobilize out of room in wheelchair at this time. Pt continues to benefit from PT services to progress toward functional mobility goals, continue to recommend HHPT upon DC as long as pt/spouse feel they can manage him at CGA/minA level of mobility.   Orthostatic Lying   BP- Lying 95/62 (73)  Pulse- Lying 74  Orthostatic Sitting  BP- Sitting (!) 86/58 (69)  Pulse- Sitting 75   Orthostatic Sitting  (after reciprocal STS exercise and standing hip flexion completed)  BP- Sitting (!) 78/47 (58)  Pulse- Sitting 77   Orthostatic Lying   BP- Lying 110/69 (82) (after return to supine from seated/standing exercises)  Pulse- Lying 79     If plan is discharge home, recommend the following: A lot of help with walking and/or transfers;A lot of help with bathing/dressing/bathroom;Assistance with cooking/housework;Assist for transportation;Help with stairs or ramp for entrance   Can travel by private vehicle         Equipment Recommendations  None recommended by PT (may need hospital bed for home pending progress)    Recommendations for Other Services       Precautions / Restrictions Precautions Precautions: Fall;Other (comment) Recall of Precautions/Restrictions: Intact Precaution/Restrictions Comments: watch BP, bowel incontinence Restrictions Weight Bearing Restrictions Per Provider Order: No     Mobility  Bed Mobility Overal bed mobility: Needs Assistance Bed Mobility: Supine to Sit, Sit to Supine     Supine to sit: Supervision Sit to supine: Supervision   General bed mobility comments: Supervision for safety to complete bed mobility. Pt using hospital bed features to pull up to sitting.    Transfers Overall transfer level: Needs assistance Equipment used: Rolling walker (2 wheels) Transfers: Sit to/from Stand Sit to Stand: Contact guard assist           General transfer comment: CGA for sit to stand. Pt dizzy sitting and standing, BP noted to be lower sitting EOB after standing exercises performed. less dizzy after return to supine with noted improvement in BP    Ambulation/Gait Ambulation/Gait assistance: Min assist   Assistive device: Rolling walker (2 wheels)       Pre-gait activities: standing hip flexion at RW x10 reps and sidesteps toward [facility], defer gait in room due to symptomatic drop in BP and severe fatigue after seated/standing exercises and pre-gait tasks.     Stairs             Wheelchair Mobility     Tilt Bed    Modified Rankin (Stroke Patients Only)       Balance Overall balance assessment:  Mild deficits observed, not formally tested                                          Communication Communication Communication: Impaired Factors Affecting Communication: Hearing impaired  Cognition Arousal: Alert Behavior During Therapy: WFL for tasks assessed/performed   PT - Cognitive impairments: No apparent  impairments                         Following commands: Intact      Cueing Cueing Techniques: Verbal cues, Gestural cues  Exercises Other Exercises Other Exercises: standing hip flexion x10 reps ea, very fatigued after 10 reps Other Exercises: STS x 5 reps from EOB<>RW, using hands to push up    General Comments General comments (skin integrity, edema, etc.): see BP in comments above; orthostatic despite BLE TED hose.      Pertinent Vitals/Pain Pain Assessment Pain Assessment: No/denies pain    Home Living                          Prior Function            PT Goals (current goals can now be found in the care plan section) Acute Rehab PT Goals Patient Stated Goal: Get stronger and return home Progress towards PT goals: Progressing toward goals (slowly; BP limiting)    Frequency    Min 2X/week      PT Plan      Co-evaluation              AM-PAC PT 6 Clicks Mobility   Outcome Measure  Help needed turning from your back to your side while in a flat bed without using bedrails?: A Little Help needed moving from lying on your back to sitting on the side of a flat bed without using bedrails?: A Little (with hospital bed rails) Help needed moving to and from a bed to a chair (including a wheelchair)?: A Little Help needed standing up from a chair using your arms (e.g., wheelchair or bedside chair)?: A Little Help needed to walk in hospital room?: Total Help needed climbing 3-5 steps with a railing? : Total 6 Click Score: 14    End of Session Equipment Utilized During Treatment: Gait belt Activity Tolerance: Patient tolerated treatment well;Patient limited by fatigue Patient left: in bed;with call bell/phone within reach;with bed alarm set;Other (comment) (heels floated) Nurse Communication: Mobility status;Other (comment) (RN/MD notified about continued orthostatic symptoms despite TED hose) PT Visit Diagnosis: Other abnormalities of gait  and mobility (R26.89);Muscle weakness (generalized) (M62.81)     Time: 1735-1755 PT Time Calculation (min) (ACUTE ONLY): 20 min  Charges:    $Therapeutic Exercise: 8-22 mins PT General Charges $$ ACUTE PT VISIT: 1 Visit                     Jo Booze P., PTA Acute Rehabilitation Services Secure Chat Preferred 9a-5:30pm Office: [phone]    [name] [facility] 05/30/2024, 6:28 PM

## 2024-05-30 NOTE — Plan of Care (Signed)

## 2024-05-30 NOTE — Progress Notes (Signed)
 [facility] ROUNDING NOTE   Subjective: Patient doing pretty well today.  Had multiple loose stools with incontinence last night, but has only had 2 today.  Mestinon  order had apparently fallen off and he did not get any yesterday.  This has been restarted by hospitalist. He has been bothered by symptoms of orthostatic hypotension today, feels fine as long as he is lying down.   Objective: Vital signs in last 24 hours: Temp:  [97.5 F (36.4 C)-98.1 F (36.7 C)] 97.5 F (36.4 C) (01/06 1730) Pulse Rate:  [71-81] 72 (01/06 1730) Resp:  [15-18] 18 (01/06 1730) BP: (89-100)/(60-71) 99/71 (01/06 1730) SpO2:  [95 %-99 %] 99 % (01/06 1730) Weight:  [72.5 kg] 72.5 kg (01/06 0500) Last BM Date : 05/31/23 General: NAD, pleasant Caucasian male, wife at bedside Lungs:  CTA b/l, no w/r/r Heart:  RRR, no m/r/g Abdomen: Distended but soft, nontender, high pitched/tinkling bowel sounds; overall exam similar to yesterday Ext:  No c/c/e    Intake/Output from previous day: 01/05 0701 - 01/06 0700 In: 236 [P.O.:236] Out: 600 [Urine:600] Intake/Output this shift: No intake/output data recorded.   Lab Results: No results for input(s): WBC, HGB, PLT, MCV in the last 72 hours. BMET Recent Labs    05/28/24 0717 05/29/24 0444  NA 136 136  K 3.9 4.6  CL 106 106  CO2 22 23  GLUCOSE 88 108*  BUN 14 16  CREATININE 0.71 0.79  CALCIUM  8.1* 8.0*   LFT No results for input(s): PROT, ALBUMIN, AST, ALT, ALKPHOS, BILITOT, BILIDIR, IBILI in the last 72 hours. PT/INR No results for input(s): INR in the last 72 hours.    Imaging/Other results: No results found.    Assessment and Plan:  79 year old male with extensive cardiac history and recurrent colonic pseudoobstruction with multiple admissions for the same, admitted for worsening abdominal distention, consistent with recurrent colonic pseudoobstruction. Currently appears comfortable, tolerating  p.o. without any nausea or vomiting.  Abdominal distention and discomfort are significantly improved compared to admission. Mestinon  was started December 31 and it appears to be helping.   Chronic colonic pseudoobstruction - Improving with Mestinon  30 mg twice daily.  Although there is no standard dosing for Mestinon  in this clinical scenario (clinical studies have various dosages used), I suggested maintaining his current dose, as long as there is continued clinical improvement, and to consider increasing doses when his symptoms plateau or worsen.  We discussed the potential barriers of obtaining this medication as an outpatient based on insurance approval/cost.  Currently denies any anticholinergic side effects. - Continue soft diet for now - Continue to monitor renal function and electrolytes -Will begin to investigate the process of getting Mestinon  as outpatient tomorrow  Orthostatic hypotension - May be related to medications (tamsulosin ) - Recommended patient just be very cautious with transitioning from supine to standing, and take several minutes to acclimatize   [name], MD  05/30/2024, 10:06 PM [facility]

## 2024-05-31 DIAGNOSIS — E875 Hyperkalemia: Secondary | ICD-10-CM

## 2024-05-31 DIAGNOSIS — I951 Orthostatic hypotension: Secondary | ICD-10-CM

## 2024-05-31 LAB — BASIC METABOLIC PANEL WITH GFR
Anion gap: 7 (ref 5–15)
BUN: 18 mg/dL (ref 8–23)
CO2: 24 mmol/L (ref 22–32)
Calcium: 8.2 mg/dL — ABNORMAL LOW (ref 8.9–10.3)
Chloride: 106 mmol/L (ref 98–111)
Creatinine, Ser: 0.73 mg/dL (ref 0.61–1.24)
GFR, Estimated: >60 mL/min
Glucose, Bld: 86 mg/dL (ref 70–99)
Potassium: 5.2 mmol/L — ABNORMAL HIGH (ref 3.5–5.1)
Sodium: 136 mmol/L (ref 135–145)

## 2024-05-31 LAB — MAGNESIUM: Magnesium: 1.8 mg/dL (ref 1.7–2.4)

## 2024-05-31 MED ORDER — PYRIDOSTIGMINE BROMIDE 60 MG PO TABS
30.0000 mg | ORAL_TABLET | Freq: Two times a day (BID) | ORAL | Status: DC
  Administered 2024-06-01: 30 mg via ORAL
  Filled 2024-05-31 (×2): qty 0.5

## 2024-05-31 NOTE — Progress Notes (Addendum)
 " PROGRESS NOTE   Chase Combs  FMW:[id number]    DOB: 11/16/1945    DOA: 05/19/2024  PCP: Wonda Worth SQUIBB, PA   I have briefly reviewed patients previous medical records in [facility].   Brief Hospital Course:  Chase Combs is a 79 y.o. male, lives with his spouse, ambulance with the help of wheelchair, walker at home and has all DME's, with PMH of hypertension, T2DM, CAD, chronic HFrEF W/ ef 20-25%, Global HKN on 02/21/24 , atrial fibrillation on Eliquis , OSA with CPAP intolerance, and chronic ileus/Ogilvie syndrome who presented to ED on 12/26 with recurrent abdominal pain/distention, nausea and vomiting that started on midday 12/25.  He was not able to keep oral intake and has chronic abdominal distention and nausea vomiting diarrhea. Recent prolonged admit 11/28-12/18- 2/2 recurrent Ogilvie's syndrome, severe malnutrition profound diarrhea> and discharged to SNF. In ED  afebrile and  spo2 mid 90s on  RA,  mild tachycardia and stable blood pressure. Labs-sodium 128, BUN 67, creatinine 1.96, normal WBC, troponin 49, and proBNP 10,122.   CT is concerning for distal large bowel obstruction with transition point at the proximal sigmoid colon. GI was consulted by the ED, s/p ivf 500 cc LR and Reglan , and  NG tube and rectal tube ordered. Managed with conservative treatment, NG clamped and subsequently removed 12/29.  Clinical improvement on Mestinon .   Assessment & Plan:   Recurrent Pseudo-obstruction of colon Ogilvie syndrome Recurrent abdominal pain Nausea/vomiting - S/P NG tube decompression, overall with some clinical improvement and NG tube removed 12/29, rectal tube removed as well and diet started - s/p relistor  - Abdominal x-ray 12/30: Marked gaseous distention of primarily colon and to a lesser extent small bowel, not significantly changed from 12/29 exam - Potassium 5.2 after potassium supplements, discontinued same.  Magnesium  1.8. [facility] following.  Currently on  Mestinon  30 mg twice daily.  4 BMs documented over the last 24 hours yesterday.  3 BMs since this morning.  As per GI, there is likely an insurance barrier to get this approved for this indication but will await their follow-up.  Tolerating p.o.  Orthostatic hypotension May be multifactorial.  Continue bilateral lower extremity knee-high compression stockings.  Clinically euvolemic. Tamsulosin  may be contributing some but likely not fully the cause of his orthostatic hypotension. Discussed regarding gradual activity from supine to ambulation.   AKI on CKD stage II Hypovolemic hyponatremia Hypokalemia Metabolic acidosis Recent urinary obstruction All above resolved.   Mild hyperkalemia/5.2 on 1/7.  Discontinued standing potassium supplements.  Follow BMP in a.m.   CAD Ischemic cardiomyopathy Mildly elevated troponin but no chest pain - Continue Plavix  and Crestor  20 mg daily.   Chronic HFrEF Recent prolonged hospitalization complicated by cardiogenic shock with acute CHF with LAD stent stenosis 01/25/24: Last EF 20-25%, Global HKN on 02/21/24, proBNP at baseline 10,122-was 15,2454 in 06/26/24.   PTA on Jardiance , Aldactone ,recently digoxin  Entresto  was held due to softer blood pressure Cardiology following close- not to resume digoxin  on d/c Cardiology signoff note from 1/3 appreciated.  Entresto  was stopped 12/18.  Holding off on Jardiance  and diuretics until patient is seen back in cardiology clinic given persistent soft blood pressures.   Atrial fibrillation on Eliquis  Was on heparin > switched to oral Eliquis  as taking po. Cont amiodarone , monitor in telemetry   History of PUD Continue PPI p.o   T2DM Last A1c normal 5.5 last month, blood sugar is stable   OSA CPAP intolerance   Thyroid  nodules;  renal lesions  Indeterminate thyroid  nodules and b/l renal lesions noted incidentally on CT in ED  Outpatient follow-up with thyroid  US  and renal protocol MRI recommended    Goals of  care Patient remains very high risk for readmission with complex medical comorbidities and at risk for acute decompensation, overall prognosis does not appear bright.He was seen by palliative care on his last admission-he is DNR limited with full scope of intervention.   Depression Anxiety Mood is stable, continue SSRI, prn Atarax    Deconditioning Therapies recommended home health PT and OT on 1/6.  Body mass index is 21.68 kg/m.   DVT prophylaxis:   On apixaban  anticoagulation   Code Status: Limited: Do not attempt resuscitation (DNR) -DNR-LIMITED -Do Not Intubate/DNI :  Family Communication: None at bedside Disposition:  Status is: Inpatient Remains inpatient appropriate because: Pending further clinical improvement and GI clearance     Consultants:   [facility]  Procedures:   As noted above  Subjective:  When seen this morning, patient reported that he had 2 BMs overnight and felt like he was going to have another 1 shortly.  Felt that his abdominal distention was only mildly better since admission.  Denied abdominal pain.  Tolerating diet without nausea or vomiting.  Objective:   Vitals:   05/30/24 2238 05/31/24 0500 05/31/24 0747 05/31/24 1122  BP: (!) 86/56 (!) 87/56  107/74  Pulse: 70 69  78  Resp: 16 16  17   Temp: 97.6 F (36.4 C) 97.8 F (36.6 C)  (!) 97.5 F (36.4 C)  TempSrc:  Oral    SpO2: 98% 97% 97% 98%  Weight:      Height:        General exam: Elderly male, moderately built and frail/chronically ill looking lying comfortably supine in bed without distress. Respiratory system: Clear to auscultation. Respiratory effort normal. Cardiovascular system: S1 & S2 heard, RRR. No JVD, murmurs, rubs, gallops or clicks. No pedal edema.  Telemetry personally reviewed: Sinus rhythm, BBB morphology, occasional PVCs. Gastrointestinal system: Abdomen is significant distention, soft and nontender.  Tympanitic.  No organomegaly or masses felt.  Paucity of bowel  sounds/tinkling Central nervous system: Alert and oriented. No focal neurological deficits. Extremities: Symmetric 5 x 5 power. Skin: No rashes, lesions or ulcers Psychiatry: Judgement and insight appear normal. Mood & affect appropriate.     Data Reviewed:   I have personally reviewed following labs and imaging studies   CBC: Recent Labs  Lab 05/25/24 0502  WBC 12.8*  HGB 13.0  HCT 38.4*  MCV 89.3  PLT 329    Basic Metabolic Panel: Recent Labs  Lab 05/26/24 0612 05/27/24 0825 05/28/24 0717 05/29/24 0444 05/31/24 0415  NA 133* 134* 136 136 136  K 4.2 4.4 3.9 4.6 5.2*  CL 102 107 106 106 106  CO2 23 19* 22 23 24   GLUCOSE 136* 88 88 108* 86  BUN 15 15 14 16 18   CREATININE 0.73 0.70 0.71 0.79 0.73  CALCIUM  8.6* 8.1* 8.1* 8.0* 8.2*  MG 1.7 2.0 1.8 2.3 1.8    Liver Function Tests: No results for input(s): AST, ALT, ALKPHOS, BILITOT, PROT, ALBUMIN in the last 168 hours.  CBG: No results for input(s): GLUCAP in the last 168 hours.  Microbiology Studies:  No results found for this or any previous visit (from the past 240 hours).  Radiology Studies:  No results found.  Scheduled Meds:    amiodarone   200 mg Oral Daily   apixaban   5 mg  Oral BID   budesonide   0.25 mg Nebulization BID   clopidogrel   75 mg Oral Daily   cyanocobalamin   1,000 mcg Oral Daily   feeding supplement  237 mL Oral BID BM   Gerhardt's butt cream   Topical TID   pantoprazole   40 mg Oral Daily   polyethylene glycol  17 g Oral BID   pyridostigmine   30 mg Oral BID   revefenacin   175 mcg Nebulization Daily   sertraline   50 mg Oral q morning   sodium chloride  flush  3 mL Intravenous Q12H   tamsulosin   0.4 mg Oral QHS    Continuous Infusions:     LOS: 12 days     Trenda Mar, MD,  FACP, [facility], [facility], [facility]   [facility] [place]   To contact the attending provider between 7A-7P or the covering provider during after hours 7P-7A,  please log into the web site [email or web address] and access using universal [place] password for that web site. If you do not have the password, please call the hospital operator.  05/31/2024, 3:51 PM   "

## 2024-05-31 NOTE — Progress Notes (Addendum)
 Physical Therapy Treatment Patient Details Name: Chase Combs MRN: [id number] DOB: Oct 29, 1945 Today's Date: 05/31/2024   History of Present Illness Pt is a 79 y.o male admitted from [facility] for N/V/D. 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 Symptomatic orthostatic hypotension 12/31 during PT session. PMH: NSTEMI, CAD, heart failure, a fib, HTN, HLD, DMII, CKD, OSA, prostate cancer, Ogilvie syndrome    PT Comments  Pt received in supine, c/o fatigue after pivoting OOB to chair and back to bed earlier in the day and reports he had episode of nausea/vomiting when he pivoted to chair. Pt BP checked and noted to be low in supine, so defer OOB to wheelchair as originally planned, but pt agreeable to supine/bed chair posture UE/LE exercises for strengthening. After exercises, BP rechecked and more stable, so pt agreeable to [facility] being elevated and pillows to promote more upright posture, so pt can work on tolerance for chair/wheelchair in preparation for wheelchair mobility tomorrow if able. Pt continues to benefit from PT services to progress toward functional mobility goals, pt would benefit from palliative following to assess him closely given unstable vitals and pt is aware he would be safer at wheelchair level at this time given that BP has been consistently low, especially when standing.   Vital Signs  Pulse Rate 79  Pulse Rate Source Monitor  BP (!) 88/60 (70)  BP Location Left Arm  BP Method Automatic  Patient Position (if appropriate) Lying (prior to supine exercises)   Pulse Rate 71  Pulse Rate Source Monitor  BP 103/82 (90)  BP Location Left Arm  BP Method Automatic  Patient Position (if appropriate)  (bed in chair posture after UE/LE exercises, HOB >45 deg)     If plan is discharge home, recommend the following: A lot of help with walking and/or transfers;A lot of help with bathing/dressing/bathroom;Assistance with cooking/housework;Assist for transportation;Help  with stairs or ramp for entrance   Can travel by private vehicle        Equipment Recommendations  None recommended by PT (may need hospital bed for home pending progress; currently will be at wheelchair level)    Recommendations for Other Services       Precautions / Restrictions Precautions Precautions: Fall;Other (comment) Recall of Precautions/Restrictions: Intact Precaution/Restrictions Comments: watch BP, bowel incontinence Restrictions Weight Bearing Restrictions Per Provider Order: No     Mobility  Bed Mobility Overal bed mobility: Needs Assistance Bed Mobility: Supine to Sit     Supine to sit: Contact guard, Min assist Sit to supine: Supervision   General bed mobility comments: CGA to minA to reach long sitting with use of R bed rail and HHA/trunk support, pt defers EOB due to recent nausea/vomiting when getting OOB to chair with NT earlier in the day.    Transfers Overall transfer level: Needs assistance                 General transfer comment: PTA defer due to low BP initially and pt recent n/v; BP improved after supine exercises, but pt defer due to fatigue at that time.    Ambulation/Gait               General Gait Details: pt defer due to fatigue/nausea and dizziness with [facility] elevated   Stairs             Wheelchair Mobility     Tilt Bed    Modified Rankin (Stroke Patients Only)       Balance Overall balance assessment:  Mild deficits observed, not formally tested                                          Communication Communication Communication: Impaired Factors Affecting Communication: Hearing impaired  Cognition Arousal: Alert Behavior During Therapy: WFL for tasks assessed/performed   PT - Cognitive impairments: No apparent impairments                         Following commands: Intact      Cueing Cueing Techniques: Verbal cues  Exercises Other Exercises Other Exercises: bed  chair posture UE pushing/pulling with gentle resistance within tolerance x5 reps ea x2 sets (rowing type movements) Other Exercises: Bed chair posture BLE AROM: LAQ, hip flexion x5 reps ea x2 sets with gentle resistance wtihin tolerance Other Exercises: supine BLE AROM: hamstring sets pushing into mattress x10 reps ea and ankle pumps x10 Other Exercises: supine to long sitting using R bed rail and HHA PRN x 5 reps with cues for core activation    General Comments General comments (skin integrity, edema, etc.): see BP in comments above; BP initially very low, improved after UE/LE exercises, pt agreeable to [facility] elevated to ~50 deg, but refusing EOB/OOB due to fatigue. SpO2/HR WFL on RA.      Pertinent Vitals/Pain Pain Assessment Pain Assessment: Faces Faces Pain Scale: No hurt Pain Descriptors / Indicators: Grimacing Pain Intervention(s): Limited activity within patient's tolerance, Monitored during session, Repositioned    Home Living                          Prior Function            PT Goals (current goals can now be found in the care plan section) Acute Rehab PT Goals Patient Stated Goal: Get stronger and return home Progress towards PT goals: Not progressing toward goals - comment (due to low BP)    Frequency    Min 2X/week      PT Plan      Co-evaluation              AM-PAC PT 6 Clicks Mobility   Outcome Measure  Help needed turning from your back to your side while in a flat bed without using bedrails?: A Little Help needed moving from lying on your back to sitting on the side of a flat bed without using bedrails?: A Little (with hospital bed rails) Help needed moving to and from a bed to a chair (including a wheelchair)?: A Little Help needed standing up from a chair using your arms (e.g., wheelchair or bedside chair)?: A Little Help needed to walk in hospital room?: Total Help needed climbing 3-5 steps with a railing? : Total 6 Click Score:  14    End of Session   Activity Tolerance: Patient limited by fatigue;Treatment limited secondary to medical complications (Comment);Other (comment) (low BP and dizziness with HOB elevated; good effort for supine exercises) Patient left: in bed;with call bell/phone within reach;with bed alarm set;Other (comment) (heels floated, HOB >50 deg and pillow behind lower back and neck discourage sacral sitting/support lumbar spine) Nurse Communication: Mobility status;Other (comment);Precautions (pt nausea/dizziness wtih HOB elevated) PT Visit Diagnosis: Other abnormalities of gait and mobility (R26.89);Muscle weakness (generalized) (M62.81)     Time: 8384-8369 PT Time Calculation (min) (ACUTE ONLY): 15 min  Charges:    $  Therapeutic Exercise: 8-22 mins PT General Charges $$ ACUTE PT VISIT: 1 Visit                     Valicia Rief P., PTA Acute Rehabilitation Services Secure Chat Preferred 9a-5:30pm Office: [phone]    [name] [facility] 05/31/2024, 5:56 PM

## 2024-05-31 NOTE — Plan of Care (Signed)

## 2024-06-01 ENCOUNTER — Other Ambulatory Visit (HOSPITAL_COMMUNITY): Payer: Self-pay

## 2024-06-01 LAB — BASIC METABOLIC PANEL WITH GFR
Anion gap: 8 (ref 5–15)
BUN: 19 mg/dL (ref 8–23)
CO2: 22 mmol/L (ref 22–32)
Calcium: 8.1 mg/dL — ABNORMAL LOW (ref 8.9–10.3)
Chloride: 104 mmol/L (ref 98–111)
Creatinine, Ser: 0.77 mg/dL (ref 0.61–1.24)
GFR, Estimated: >60 mL/min
Glucose, Bld: 81 mg/dL (ref 70–99)
Potassium: 3.9 mmol/L (ref 3.5–5.1)
Sodium: 134 mmol/L — ABNORMAL LOW (ref 135–145)

## 2024-06-01 MED ORDER — POLYETHYLENE GLYCOL 3350 17 GM/SCOOP PO POWD
17.0000 g | Freq: Two times a day (BID) | ORAL | 0 refills | Status: DC
  Filled 2024-06-01: qty 476, 14d supply, fill #0

## 2024-06-01 MED ORDER — PYRIDOSTIGMINE BROMIDE 60 MG PO TABS
30.0000 mg | ORAL_TABLET | Freq: Two times a day (BID) | ORAL | 1 refills | Status: DC
  Filled 2024-06-01: qty 30, 30d supply, fill #0

## 2024-06-01 NOTE — Progress Notes (Signed)
 Occupational Therapy Treatment Patient Details Name: Chase Combs MRN: [id number] DOB: [birth year] Today's Date: 06/01/2024   History of present illness Pt is a 79 y.o male admitted from [facility] for N/V/D. 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 Symptomatic orthostatic hypotension 12/31 during PT session. PMH: NSTEMI, CAD, heart failure, a fib, HTN, HLD, DMII, CKD, OSA, prostate cancer, Ogilvie syndrome   OT comments  Session focused on progressing ADLs and ADL mobility. Pt's brief soiled with BM as well as bed pad. BP at rest in supine 92/60. Pt sat EOB with Sup to stand at EOB for assistance with hygiene, brief change, gown change and bed linen change. BP in standing with RW at EOB 78/54 with c/o light dizziness, RN notified and pt returned to supine. OT will continue to follow acutely to maximize level of function and safety      If plan is discharge home, recommend the following:  A lot of help with walking and/or transfers;A lot of help with bathing/dressing/bathroom;Assistance with cooking/housework;Assist for transportation   Equipment Recommendations  None recommended by OT    Recommendations for Other Services      Precautions / Restrictions Precautions Precautions: Fall;Other (comment) Recall of Precautions/Restrictions: Intact Precaution/Restrictions Comments: watch BP, bowel incontinence Restrictions Weight Bearing Restrictions Per Provider Order: No       Mobility Bed Mobility Overal bed mobility: Needs Assistance Bed Mobility: Supine to Sit, Sit to Supine     Supine to sit: Supervision Sit to supine: Supervision   General bed mobility comments: BP in supine 99/60, pt brief soiled with BM as well as top pad and sat EOB for assist with clean up and linen change. BP dropped to 88/54 stading at RW during posterior hygiene and brief change    Transfers Overall transfer level: Needs assistance Equipment used: Rolling walker (2 wheels) Transfers:  Sit to/from Stand Sit to Stand: Contact guard assist                 Balance Overall balance assessment: Mild deficits observed, not formally tested Sitting-balance support: Feet supported Sitting balance-Leahy Scale: Good     Standing balance support: Bilateral upper extremity supported, During functional activity Standing balance-Leahy Scale: Poor                             ADL either performed or assessed with clinical judgement   ADL Overall ADL's : Needs assistance/impaired     Grooming: Wash/dry hands;Wash/dry face;Supervision/safety;Sitting       Lower Body Bathing: Minimal assistance;Sitting/lateral leans   Upper Body Dressing : Supervision/safety;Sitting       Toilet Transfer: Contact guard assist;Rolling walker (2 wheels)   Toileting- Clothing Manipulation and Hygiene: Total assistance;Sit to/from stand Toileting - Clothing Manipulation Details (indicate cue type and reason): stood at RW at [facility] for posterior hygiene due to brief soiled with BM     Functional mobility during ADLs: Contact guard assist;Rolling walker (2 wheels)      Extremity/Trunk Assessment Upper Extremity Assessment Upper Extremity Assessment: Generalized weakness;Right hand dominant   Lower Extremity Assessment Lower Extremity Assessment: Defer to PT evaluation        Vision Ability to See in Adequate Light: 0 Adequate Patient Visual Report: No change from baseline     Perception     Praxis     Communication Communication Communication: Impaired Factors Affecting Communication: Hearing impaired   Cognition Arousal: Alert Behavior During Therapy: [facility] for tasks assessed/performed  Cognition: No apparent impairments                               Following commands: Intact Following commands impaired: Follows one step commands with increased time      Cueing   Cueing Techniques: Verbal cues  Exercises      Shoulder Instructions        General Comments      Pertinent Vitals/ Pain       Pain Assessment Pain Assessment: Faces Faces Pain Scale: No hurt  Home Living                                          Prior Functioning/Environment              Frequency  Min 2X/week        Progress Toward Goals  OT Goals(current goals can now be found in the care plan section)  Progress towards OT goals: Progressing toward goals     Plan      Co-evaluation                 AM-PAC OT 6 Clicks Daily Activity     Outcome Measure   Help from another person eating meals?: None Help from another person taking care of personal grooming?: A Little Help from another person toileting, which includes using toliet, bedpan, or urinal?: A Lot Help from another person bathing (including washing, rinsing, drying)?: A Lot Help from another person to put on and taking off regular upper body clothing?: A Little Help from another person to put on and taking off regular lower body clothing?: A Lot 6 Click Score: 16    End of Session Equipment Utilized During Treatment: Gait belt;Rolling walker (2 wheels)  OT Visit Diagnosis: Other abnormalities of gait and mobility (R26.89);Muscle weakness (generalized) (M62.81);Unsteadiness on feet (R26.81)   Activity Tolerance Other (comment) (BP)   Patient Left in bed;with call bell/phone within reach;with bed alarm set   Nurse Communication Mobility status;Other (comment) (BP)        Time: 8741-8678 OT Time Calculation (min): 23 min  Charges: OT General Charges $OT Visit: 1 Visit OT Treatments $Self Care/Home Management : 8-22 mins $Therapeutic Activity: 8-22 mins    Jacques Karna Loose 06/01/2024, 2:55 PM

## 2024-06-01 NOTE — Progress Notes (Signed)
 "  Nursing Discharge Note   Name: Chase Combs MRN: [id number] DOB: June 13, 1945    Admit Date:  05/19/2024  Discharge Date:  06/01/2024   Chase Combs is to be discharged home per MD order.  AVS completed. Reviewed with patient and family at bedside. Highlighted copy provided for patient to take home.  Patient/caregiver able to verbalize understanding of discharge instructions. PIV removed. Patient stable upon discharge.     Discharge Instructions      Additional Discharge Instructions   Please get your medications reviewed and adjusted by your Primary MD.  Please request your Primary MD to go over all Hospital Tests and Procedure/Radiological results at the follow up, please get all Hospital records sent to your Primary MD by signing hospital release before you go home.  If you had Pneumonia of Lung problems at the Hospital: Please get a 2 view Chest X ray done in approximately 4 weeks after hospital discharge or sooner if instructed by your Primary MD.  If you have Congestive Heart Failure: Please call your Cardiologist or Primary MD anytime you have any of the following symptoms:  1) 3 pound weight gain in 24 hours or 5 pounds in 1 week  2) shortness of breath, with or without a dry hacking cough  3) swelling in the hands, feet or stomach  4) if you have to sleep on extra pillows at night in order to breathe  Follow cardiac low salt diet and 1.5 lit/day fluid restriction.  If you have diabetes Accuchecks 4 times/day, Once in AM empty stomach and then before each meal. Log in all results and show them to your primary doctor at your next visit. If any glucose reading is under 80 or above 300 call your primary MD immediately.  If you have Seizure/Convulsions/Epilepsy: Please do not drive, operate heavy machinery, participate in activities at heights or participate in high speed sports until you have seen by Primary MD or a Neurologist and advised to do so again. Per [facility] statutes, patients with seizures are not allowed to drive until they have been seizure-free for six months.  Use caution when using heavy equipment or power tools. Avoid working on ladders or at heights. Take showers instead of baths. Ensure the water  temperature is not too high on the home water  heater. Do not go swimming alone. Do not lock yourself in a room alone (i.e. bathroom). When caring for infants or small children, sit down when holding, feeding, or changing them to minimize risk of injury to the child in the event you have a seizure. Maintain good sleep hygiene. Avoid alcohol .   If you had Gastrointestinal Bleeding: Please ask your Primary MD to check a complete blood count within one week of discharge or at your next visit. Your endoscopic/colonoscopic biopsies that are pending at the time of discharge, will also need to followed by your Primary MD.  Get Medicines reviewed and adjusted. Please take all your medications with you for your next visit with your Primary MD  Please request your Primary MD to go over all hospital tests and procedure/radiological results at the follow up, please ask your Primary MD to get all Hospital records sent to his/her office.  If you experience worsening of your admission symptoms, develop shortness of breath, life threatening emergency, suicidal or homicidal thoughts you must seek medical attention immediately by calling 911 or calling your MD immediately  if symptoms less severe.  You must read complete instructions/literature along  with all the possible adverse reactions/side effects for all the Medicines you take and that have been prescribed to you. Take any new Medicines after you have completely understood and accpet all the possible adverse reactions/side effects.   Do not drive or operate heavy machinery when taking Pain medications.   Do not take more than prescribed Pain, Sleep and Anxiety Medications  Special Instructions: If  you have smoked or chewed Tobacco  in the last 2 yrs please stop smoking, stop any regular Alcohol   and or any Recreational drug use.  Wear Seat belts while driving.  Please note You were cared for by a hospitalist during your hospital stay. If you have any questions about your discharge medications or the care you received while you were in the hospital after you are discharged, you can call the unit and asked to speak with the hospitalist on call if the hospitalist that took care of you is not available. Once you are discharged, your primary care physician will handle any further medical issues. Please note that NO REFILLS for any discharge medications will be authorized once you are discharged, as it is imperative that you return to your primary care physician (or establish a relationship with a primary care physician if you do not have one) for your aftercare needs so that they can reassess your need for medications and monitor your lab values.  You can reach the hospitalist office at phone [phone] or fax [phone]   If you do not have a primary care physician, you can call [phone] for a physician referral.       Discharge Instructions     (HEART FAILURE PATIENTS) Call MD:  Anytime you have any of the following symptoms: 1) 3 pound weight gain in 24 hours or 5 pounds in 1 week 2) shortness of breath, with or without a dry hacking cough 3) swelling in the hands, feet or stomach 4) if you have to sleep on extra pillows at night in order to breathe.   Complete by: As directed    Call MD for:  difficulty breathing, headache or visual disturbances   Complete by: As directed    Call MD for:  extreme fatigue   Complete by: As directed    Call MD for:  persistant dizziness or light-headedness   Complete by: As directed    Call MD for:  persistant nausea and vomiting   Complete by: As directed    Call MD for:  severe uncontrolled pain   Complete by: As directed    Call MD for:   temperature >100.4   Complete by: As directed    Diet - low sodium heart healthy   Complete by: As directed    Increase activity slowly   Complete by: As directed    No wound care   Complete by: As directed         Allergies as of 06/01/2024       Reactions   Codeine Nausea And Vomiting   Latex Rash   Oral blisters when used at dentist        Medication List     STOP taking these medications    digoxin  0.125 MG tablet Commonly known as: LANOXIN    empagliflozin  10 MG Tabs tablet Commonly known as: Jardiance    furosemide  20 MG tablet Commonly known as: LASIX    polyethylene glycol 17 g packet Commonly known as: MIRALAX  / GLYCOLAX  Replaced by: polyethylene glycol powder 17 GM/SCOOP powder   potassium chloride   SA 20 MEQ tablet Commonly known as: KLOR-CON  M   sacubitril -valsartan  24-26 MG Commonly known as: Entresto    spironolactone  25 MG tablet Commonly known as: ALDACTONE        TAKE these medications    amiodarone  200 MG tablet Commonly known as: PACERONE  Take 1 tablet (200 mg total) by mouth daily.   barrier cream Crea Commonly known as: non-specified Apply 1 Application topically in the morning, at noon, and at bedtime. Apply to buttocks every shift for skin protection   budesonide  0.25 MG/2ML nebulizer solution Commonly known as: PULMICORT  Take 2 mLs (0.25 mg total) by nebulization 2 (two) times daily.   clopidogrel  75 MG tablet Commonly known as: PLAVIX  Take 1 tablet (75 mg total) by mouth daily.   cyanocobalamin  1000 MCG tablet Commonly known as: VITAMIN B12 Take 1,000 mcg by mouth daily.   Eliquis  5 MG Tabs tablet Generic drug: apixaban  Take 1 tablet (5 mg total) by mouth 2 (two) times daily.   hydrOXYzine  25 MG tablet Commonly known as: ATARAX  Take 25 mg by mouth at bedtime as needed for anxiety.   ipratropium 0.02 % nebulizer solution Commonly known as: ATROVENT  Take 2.5 mLs (0.5 mg total) by nebulization 2 (two) times daily.    Multivitamin Men 50+ Tabs Take 1 tablet by mouth daily.   nitroGLYCERIN  0.4 MG SL tablet Commonly known as: NITROSTAT  Place 0.4 mg under the tongue every 5 (five) minutes as needed for chest pain. Give one tablet (0.4mg ) sublingually every 5 minutes as needed for chest pain for up to 3 doses.   ondansetron  8 MG tablet Commonly known as: ZOFRAN  Take 8 mg by mouth every 8 (eight) hours as needed for nausea or vomiting.   OYSTER SHELL CALCIUM  PO Take 500 mg by mouth every morning.   pantoprazole  40 MG tablet Commonly known as: PROTONIX  Take 1 tablet (40 mg total) by mouth daily.   polyethylene glycol powder 17 GM/SCOOP powder Commonly known as: GLYCOLAX /MIRALAX  Dissolve 1 capful (17g) in 4-8 ounces of liquid and take by mouth 2 (two) times daily. Replaces: polyethylene glycol 17 g packet   Pro-Stat Liqd Take 30 mLs by mouth in the morning and at bedtime.   pyridostigmine  60 MG tablet Commonly known as: MESTINON  Take 0.5 tablets (30 mg total) by mouth 2 (two) times daily.   revefenacin  175 MCG/3ML nebulizer solution Commonly known as: YUPELRI  Take 3 mLs (175 mcg total) by nebulization daily.   rosuvastatin  20 MG tablet Commonly known as: CRESTOR  TAKE 1 TABLET(20 MG) BY MOUTH DAILY What changed: See the new instructions.   saccharomyces boulardii 250 MG capsule Commonly known as: FLORASTOR Take 500 mg by mouth 2 (two) times daily.   sertraline  50 MG tablet Commonly known as: ZOLOFT  Take 50 mg by mouth every morning.   tamsulosin  0.4 MG Caps capsule Commonly known as: Flomax  Take 1 capsule (0.4 mg total) by mouth daily after supper. What changed: when to take this         Discharge Instructions/ Education: An After Visit Summary was printed and given to the patient. Discharge instructions given to patient/family with verbalized understanding. Discharge education completed with patient/family including: follow up instructions, medication list, discharge  activities, and limitations if indicated.  Additional discharge instructions as indicated by discharging provider also reviewed.  Patient and family able to verbalize understanding, all questions fully answered. Patient instructed to return to Emergency Department, call 911, or call MD for any changes in condition.   Patient escorted via wheelchair to  lobby and discharged home via private automobile.   "

## 2024-06-01 NOTE — Discharge Instructions (Signed)

## 2024-06-01 NOTE — Progress Notes (Signed)
 Reviewed AVS, patient expressed understanding of medications, MD follow up reviewed.   Removed IV, Site clean, dry and intact.  See LDA for information on wounds at discharge.  Patient states all belongings brought to the hospital at time of admission are accounted for and packed to take home.  Picked up medications from [facility] pharmacy.   [facility] [facility] contacted to transport patient to entrance A, where family member was waiting in vehicle to transport home.

## 2024-06-01 NOTE — Discharge Summary (Addendum)
 Physician Discharge Summary  JULES BATY FMW:[id number] DOB: 08-23-1945  PCP: Wonda Worth SQUIBB, PA  Admitted from: SNF Discharged to: Home  Admit date: 05/19/2024 Discharge date: 06/01/2024  Recommendations for Outpatient Follow-up:    Contact information for follow-up providers     Turmel, Caleb P, PA. Schedule an appointment as soon as possible for a visit in 1 week(s).   Specialty: Physician Assistant Why: To be seen with repeat labs (CBC & BMP). Contact information: [address] [place] [address] [phone]         Stacia Glendia BRAVO, MD. Schedule an appointment as soon as possible for a visit in 2 week(s).   Specialty: Gastroenterology Contact information: [address] [place] [address] [phone]              Contact information for after-discharge care     Home Medical Care     [facility]) Follow up.   Service: Home Health Services Contact information: [address] [place]  [address] [phone]                      Home Health: Home Health Orders (From admission, onward)     Start     Ordered   05/22/24 1439  Home Health  At discharge       Question Answer Comment  To provide the following care/treatments PT   To provide the following care/treatments OT      05/22/24 1439             Equipment/Devices: None.  As per [facility], has all DME at home.    Discharge Condition: Improved and stable.   Code Status: Limited: Do not attempt resuscitation (DNR) -DNR-LIMITED -Do Not Intubate/DNI  Diet recommendation:  Discharge Diet Orders (From admission, onward)     Start     Ordered   06/01/24 0000  Diet - low sodium heart healthy        06/01/24 1404             Discharge Diagnoses:  Principal Problem:   Colonic pseudoobstruction Active Problems:   Ogilvie syndrome   Coronary artery disease due to lipid rich plaque   Hypokalemia   Acute  kidney injury   PUD (peptic ulcer disease)   Paroxysmal atrial flutter (HCC)   Chronic combined systolic (congestive) and diastolic (congestive) heart failure (HCC)   Thyroid  nodule   Renal lesion   Abdominal distention   Brief Hospital Course:  Chase Combs is a 79 y.o. male, lives with his spouse, ambulance with the help of wheelchair, walker at home and has all DME's, with PMH of hypertension, T2DM, CAD, chronic HFrEF W/ ef 20-25%, Global HKN on 02/21/24 , atrial fibrillation on Eliquis , OSA with CPAP intolerance, and chronic ileus/Ogilvie syndrome who presented to ED on 12/26 with recurrent abdominal pain/distention, nausea and vomiting that started on midday 12/25.  He was not able to keep oral intake and has chronic abdominal distention and nausea vomiting diarrhea. Recent prolonged admit 11/28-12/18- 2/2 recurrent Ogilvie's syndrome, severe malnutrition profound diarrhea> and discharged to SNF. In ED  afebrile and  spo2 mid 90s on  RA,  mild tachycardia and stable blood pressure. Labs-sodium 128, BUN 67, creatinine 1.96, normal WBC, troponin 49, and proBNP 10,122.   CT is concerning for distal large bowel obstruction with transition point at the proximal sigmoid colon. GI was consulted by the ED, s/p  ivf 500 cc LR and Reglan , and  NG tube and rectal tube ordered. Managed with conservative treatment, NG clamped and subsequently removed 12/29.  Clinical improvement on Mestinon .     Assessment & Plan:    Recurrent Pseudo-obstruction of colon Ogilvie syndrome Recurrent abdominal pain Nausea/vomiting - S/P NG tube decompression, overall with some clinical improvement and NG tube removed 12/29, rectal tube removed as well and diet started - s/p relistor  - Abdominal x-ray 12/30: Marked gaseous distention of primarily colon and to a lesser extent small bowel, not significantly changed from 12/29 exam - Potassium 5.2 after potassium supplements, discontinued potassium supplements  especially given that diuretics have not been stopped.  Magnesium  1.8. [facility] following.  Currently on Mestinon  30 mg twice daily.  Having BMs, tolerating diet.  Communicated with Dr. Stacia, [facility] who has cleared him for DC home on the Mestinon .  Continue MiraLAX  17 g twice daily. Outpatient GI follow-up.   Orthostatic hypotension May be multifactorial.  Continue bilateral lower extremity knee-high compression stockings.  Clinically euvolemic. Tamsulosin  may be contributing some but likely not fully the cause of his orthostatic hypotension. Discussed regarding gradual activity from supine to ambulation.   AKI on CKD stage II Hypovolemic hyponatremia Hypokalemia Metabolic acidosis Recent urinary obstruction All above resolved.   Mild hyperkalemia/5.2 on 1/7.  Discontinued standing potassium supplements.  Resolved on today's labs.  Close outpatient follow-up with repeat BMP.   CAD Ischemic cardiomyopathy Mildly elevated troponin but no chest pain - Continue Plavix  and Crestor  20 mg daily.   Chronic HFrEF Recent prolonged hospitalization complicated by cardiogenic shock with acute CHF with LAD stent stenosis 01/25/24: Last EF 20-25%, Global HKN on 02/21/24, proBNP at baseline 10,122-was 15,245 on 01/07/2024.   PTA on Jardiance , Aldactone ,recently digoxin  Entresto  was held due to softer blood pressure Cardiology following close- not to resume digoxin  on d/c Cardiology signoff note from 1/3 appreciated.  Entresto  was stopped 12/18.  Holding off on Jardiance  and diuretics until patient is seen back in cardiology clinic given persistent soft blood pressures. As communicated with cardiology card master on day of discharge, patient has a follow-up appointment with the advanced heart failure clinic on 1/16 at 2 PM.   Atrial fibrillation on Eliquis  Currently in sinus rhythm.  Continue Eliquis  and amiodarone .   History of PUD Continue PPI p.o   T2DM Last A1c normal 5.5 last  month.   OSA CPAP intolerance   Thyroid  nodules; renal lesions  Indeterminate thyroid  nodules and b/l renal lesions noted incidentally on CT in ED  Outpatient follow-up with thyroid  US  and renal protocol MRI recommended    Goals of care Patient remains very high risk for readmission with complex medical comorbidities and at risk for acute decompensation, overall prognosis does not appear bright.He was seen by palliative care on his last admission-he is DNR limited with full scope of intervention.   Depression Anxiety Mood is stable, continue SSRI, prn Atarax    Deconditioning Therapies recommended home health PT and OT on 1/6.   Body mass index is 21.68 kg/m.     Consultants:    GI Cardiology   Procedures:   As noted above   Discharge Instructions  Discharge Instructions     (HEART FAILURE PATIENTS) Call MD:  Anytime you have any of the following symptoms: 1) 3 pound weight gain in 24 hours or 5 pounds in 1 week 2) shortness of breath, with or without a dry hacking cough 3) swelling in the hands, feet or stomach  4) if you have to sleep on extra pillows at night in order to breathe.   Complete by: As directed    Call MD for:  difficulty breathing, headache or visual disturbances   Complete by: As directed    Call MD for:  extreme fatigue   Complete by: As directed    Call MD for:  persistant dizziness or light-headedness   Complete by: As directed    Call MD for:  persistant nausea and vomiting   Complete by: As directed    Call MD for:  severe uncontrolled pain   Complete by: As directed    Call MD for:  temperature >100.4   Complete by: As directed    Diet - low sodium heart healthy   Complete by: As directed    Increase activity slowly   Complete by: As directed    No wound care   Complete by: As directed         Medication List     STOP taking these medications    digoxin  0.125 MG tablet Commonly known as: LANOXIN    empagliflozin  10 MG Tabs  tablet Commonly known as: Jardiance    furosemide  20 MG tablet Commonly known as: LASIX    potassium chloride  SA 20 MEQ tablet Commonly known as: KLOR-CON  M   sacubitril -valsartan  24-26 MG Commonly known as: Entresto    spironolactone  25 MG tablet Commonly known as: ALDACTONE        TAKE these medications    amiodarone  200 MG tablet Commonly known as: PACERONE  Take 1 tablet (200 mg total) by mouth daily.   barrier cream Crea Commonly known as: non-specified Apply 1 Application topically in the morning, at noon, and at bedtime. Apply to buttocks every shift for skin protection   budesonide  0.25 MG/2ML nebulizer solution Commonly known as: PULMICORT  Take 2 mLs (0.25 mg total) by nebulization 2 (two) times daily.   clopidogrel  75 MG tablet Commonly known as: PLAVIX  Take 1 tablet (75 mg total) by mouth daily.   cyanocobalamin  1000 MCG tablet Commonly known as: VITAMIN B12 Take 1,000 mcg by mouth daily.   Eliquis  5 MG Tabs tablet Generic drug: apixaban  Take 1 tablet (5 mg total) by mouth 2 (two) times daily.   hydrOXYzine  25 MG tablet Commonly known as: ATARAX  Take 25 mg by mouth at bedtime as needed for anxiety.   ipratropium 0.02 % nebulizer solution Commonly known as: ATROVENT  Take 2.5 mLs (0.5 mg total) by nebulization 2 (two) times daily.   Multivitamin Men 50+ Tabs Take 1 tablet by mouth daily.   nitroGLYCERIN  0.4 MG SL tablet Commonly known as: NITROSTAT  Place 0.4 mg under the tongue every 5 (five) minutes as needed for chest pain. Give one tablet (0.4mg ) sublingually every 5 minutes as needed for chest pain for up to 3 doses.   ondansetron  8 MG tablet Commonly known as: ZOFRAN  Take 8 mg by mouth every 8 (eight) hours as needed for nausea or vomiting.   OYSTER SHELL CALCIUM  PO Take 500 mg by mouth every morning.   pantoprazole  40 MG tablet Commonly known as: PROTONIX  Take 1 tablet (40 mg total) by mouth daily.   polyethylene glycol 17 g  packet Commonly known as: MIRALAX  / GLYCOLAX  Take 17 g by mouth 2 (two) times daily. What changed: when to take this   Pro-Stat Liqd Take 30 mLs by mouth in the morning and at bedtime.   pyridostigmine  60 MG tablet Commonly known as: MESTINON  Take 0.5 tablets (30 mg total) by mouth  2 (two) times daily.   revefenacin  175 MCG/3ML nebulizer solution Commonly known as: YUPELRI  Take 3 mLs (175 mcg total) by nebulization daily.   rosuvastatin  20 MG tablet Commonly known as: CRESTOR  TAKE 1 TABLET(20 MG) BY MOUTH DAILY What changed: See the new instructions.   saccharomyces boulardii 250 MG capsule Commonly known as: FLORASTOR Take 500 mg by mouth 2 (two) times daily.   sertraline  50 MG tablet Commonly known as: ZOLOFT  Take 50 mg by mouth every morning.   tamsulosin  0.4 MG Caps capsule Commonly known as: Flomax  Take 1 capsule (0.4 mg total) by mouth daily after supper. What changed: when to take this       Allergies[1]    Procedures/Studies: DG Abd 1 View Result Date: 05/23/2024 CLINICAL DATA:  Abdominal distension, ileus. EXAM: ABDOMEN - 1 VIEW COMPARISON:  Radiographs yesterday FINDINGS: Marked gaseous distention of primarily colon and to a lesser extent small bowel. No significant change from yesterday's exam. No obvious free air on supine views. IMPRESSION: Marked gaseous distention of primarily colon and to a lesser extent small bowel, not significantly changed from yesterday's exam. Electronically Signed   By: Andrea Gasman M.D.   On: 05/23/2024 16:09   DG Abd 1 View Result Date: 05/21/2024 CLINICAL DATA:  Ileus. EXAM: DG ABDOMEN 1V COMPARISON:  Radiograph yesterday FINDINGS: Tip and side port of the enteric tube below the diaphragm in the stomach. Stable diffuse gaseous distention of bowel loops, primarily colon. Few prominent air-filled loops of small bowel are also seen, mild improvement in small bowel distension. No obvious free air or pneumatosis. IMPRESSION:  Stable gaseous colonic distension consistent with colonic ileus, although mild improvement in small bowel dilatation after NG tube placement. Electronically Signed   By: Andrea Gasman M.D.   On: 05/21/2024 16:52   DG Abd 1 View Result Date: 05/20/2024 CLINICAL DATA:  Nasogastric tube placement EXAM: ABDOMEN - 1 VIEW COMPARISON:  CT yesterday FINDINGS: Tip and side port of the enteric tube below the diaphragm in the stomach. Gaseous colonic distention is again seen. IMPRESSION: Tip and side port of the enteric tube below the diaphragm in the stomach. Electronically Signed   By: Andrea Gasman M.D.   On: 05/20/2024 12:19   CT CHEST ABDOMEN PELVIS WO CONTRAST Result Date: 05/19/2024 EXAM: CT CHEST, ABDOMEN AND PELVIS WITHOUT CONTRAST 05/19/2024 05:04:14 PM TECHNIQUE: CT of the chest, abdomen and pelvis was performed without the administration of intravenous contrast. Multiplanar reformatted images are provided for review. Automated exposure control, iterative reconstruction, and/or weight based adjustment of the mA/kV was utilized to reduce the radiation dose to as low as reasonably achievable. COMPARISON: CT abdomen and pelvis 04/21/2024. CLINICAL HISTORY: Abdominal distention, Hx of nonobstructive ileus with repeated symptoms of n/v/d. FINDINGS: CHEST: MEDIASTINUM AND LYMPH NODES: Coronary and aortic atherosclerotic calcifications are noted. The heart is moderately enlarged. Pericardium is unremarkable. There are multiple bilateral thyroid  nodules measuring up to 15 mm. The central airways are clear. No mediastinal, hilar or axillary lymphadenopathy. LUNGS AND PLEURA: There are bands of atelectasis in the right lower lobe, right middle lobe and inferior right upper lobe. No pulmonary edema. No pleural effusion or pneumothorax. ABDOMEN AND PELVIS: LIVER: The liver is unremarkable. GALLBLADDER AND BILE DUCTS: Gallbladder is unremarkable. No biliary ductal dilatation. SPLEEN: No acute abnormality. PANCREAS:  No acute abnormality. ADRENAL GLANDS: Right adrenal nodule, likely adenoma, appears unchanged. Left adrenal gland within normal limits. KIDNEYS, URETERS AND BLADDER: Multiple bilateral renal cysts are again seen measuring up to  3.8 cm on the right. There are additional mildly hyperdense areas in the kidneys for example left kidney image 3/81 and right kidney image 3/64 measuring 2.8 cm, which are similar to the prior study. There is a single punctate calculus in the right kidney. No stones in the ureters. There is no hydronephrosis. No perinephric or periureteral stranding. Nodular protrusion of the prostate gland into the bladder base which is unchanged. Prostate radiotherapy seeds are present. GI AND BOWEL: There is moderate air fluid level in the stomach. Ascending colon measures 10 cm. Descending colon measures 6 cm. Air and fluid levels are seen throughout the colon. Transition is seen at the level of the proximal sigmoid colon. There is sigmoid colon diverticulosis. No focal inflammation or wall thickening identified in the colon. The appendix appears shortened and within normal limits. Additionally there is dilatation of ileal loops with air fluid levels to the level of the terminal ileum measuring up to 4.5 cm. Jejunal loops appear normal in size proximally. REPRODUCTIVE ORGANS: The prostate gland is enlarged. Prostate radiotherapy seeds are present. Nodular protrusion of the prostate gland into the bladder base which is unchanged. PERITONEUM AND RETROPERITONEUM: No ascites. No free air. No pneumatosis. There is a small fat containing umbilical hernia which is unchanged. VASCULATURE: Aorta is normal in caliber. There are atherosclerotic calcifications of the aorta. ABDOMINAL AND PELVIS LYMPH NODES: No lymphadenopathy. BONES AND SOFT TISSUES: Post degenerative changes of the right shoulder. There is bilateral gynecomastia. There is mild compression deformity of T11 which is unchanged. No acute osseous  abnormality. No focal soft tissue abnormality. IMPRESSION: 1. Distal large bowel obstruction with transition at the proximal sigmoid colon and associated small bowel dilatation. 2. Sigmoid colon diverticulosis without diverticulitis. 3. Multiple thyroid  nodules measuring up to 1.5 cm, for which non-emergent thyroid  ultrasound is recommended. 4. Indeterminate bilateral hyperdense renal lesions measuring up to 2.8 cm, for which renal protocol MRI (preferred) or CT without and with contrast is recommended. 5. Punctate right renal calculus. Electronically signed by: Greig Pique MD 05/19/2024 06:34 PM EST RP Workstation: [id number]   DG Chest 2 View Result Date: 05/19/2024 CLINICAL DATA:  Shortness of breath, chest tightness EXAM: CHEST - 2 VIEW COMPARISON:  April 30, 2024 FINDINGS: Stable cardiomediastinal silhouette. Hypoinflation of the lungs. Stable right perihilar subsegmental atelectasis or scarring. Left lung is clear. IMPRESSION: Hypoinflation of the lungs with right perihilar subsegmental atelectasis or scarring. Electronically Signed   By: Lynwood Landy Raddle M.D.   On: 05/19/2024 14:27      Subjective: When seen this morning, patient reported that he had 4 BMs in the last 24 hours including a BM at midnight and a BM early this morning.  Abdomen feels less distended.  Denies abdominal pain, nausea or vomiting.  Declines SNF.  Discharge Exam:  Vitals:   06/01/24 0535 06/01/24 0820 06/01/24 0822 06/01/24 0949  BP: 104/68   99/60  Pulse: 72   74  Resp:    17  Temp: 97.6 F (36.4 C)   (!) 97.4 F (36.3 C)  TempSrc: Oral   Oral  SpO2: 98% 97% 97% 99%  Weight:      Height:        General exam: Elderly male, moderately built and frail/chronically ill looking lying comfortably supine in bed without distress. Respiratory system: Clear to auscultation. Respiratory effort normal. Cardiovascular system: S1 & S2 heard, RRR. No JVD, murmurs, rubs, gallops or clicks. No pedal edema.  Telemetry  personally reviewed: Sinus rhythm, BBB  morphology, occasional PVCs. Gastrointestinal system: Abdomen less distended, mild to moderate distention.  Less tympanitic than yesterday.  No organomegaly or masses felt.  Chronic and stable tinkling bowel sounds. Central nervous system: Alert and oriented. No focal neurological deficits. Extremities: Symmetric 5 x 5 power. Skin: No rashes, lesions or ulcers Psychiatry: Judgement and insight appear normal. Mood & affect appropriate.     The results of significant diagnostics from this hospitalization (including imaging, microbiology, ancillary and laboratory) are listed below for reference.     Microbiology: No results found for this or any previous visit (from the past 240 hours).   Labs: CBC: No results for input(s): WBC, NEUTROABS, HGB, HCT, MCV, PLT in the last 168 hours.  Basic Metabolic Panel: Recent Labs  Lab 05/26/24 0612 05/27/24 0825 05/28/24 0717 05/29/24 0444 05/31/24 0415 06/01/24 0627  NA 133* 134* 136 136 136 134*  K 4.2 4.4 3.9 4.6 5.2* 3.9  CL 102 107 106 106 106 104  CO2 23 19* 22 23 24 22   GLUCOSE 136* 88 88 108* 86 81  BUN 15 15 14 16 18 19   CREATININE 0.73 0.70 0.71 0.79 0.73 0.77  CALCIUM  8.6* 8.1* 8.1* 8.0* 8.2* 8.1*  MG 1.7 2.0 1.8 2.3 1.8  --     Liver Function Tests: No results for input(s): AST, ALT, ALKPHOS, BILITOT, PROT, ALBUMIN  in the last 168 hours.  CBG: No results for input(s): GLUCAP in the last 168 hours.  Hgb A1c No results for input(s): HGBA1C in the last 72 hours.  Lipid Profile No results for input(s): CHOL, HDL, LDLCALC, TRIG, CHOLHDL, LDLDIRECT in the last 72 hours.  Thyroid  function studies No results for input(s): TSH, T4TOTAL, T3FREE, THYROIDAB in the last 72 hours.  Invalid input(s): FREET3  Anemia work up No results for input(s): VITAMINB12, FOLATE, FERRITIN, TIBC, IRON, RETICCTPCT in the last 72  hours.  Urinalysis    Component Value Date/Time   COLORURINE YELLOW 05/19/2024 1327   APPEARANCEUR CLEAR 05/19/2024 1327   LABSPEC 1.017 05/19/2024 1327   PHURINE 5.0 05/19/2024 1327   GLUCOSEU NEGATIVE 05/19/2024 1327   HGBUR NEGATIVE 05/19/2024 1327   BILIRUBINUR NEGATIVE 05/19/2024 1327   KETONESUR NEGATIVE 05/19/2024 1327   PROTEINUR NEGATIVE 05/19/2024 1327   UROBILINOGEN 0.2 02/17/2009 0645   NITRITE NEGATIVE 05/19/2024 1327   LEUKOCYTESUR NEGATIVE 05/19/2024 1327    Discussed in detail with patient's spouse via phone, updated care and answered all questions.  Time coordinating discharge: 40 minutes  SIGNED:  Trenda Mar, MD,  FACP, [facility], [facility], [facility]   [facility] [place]    To contact the attending provider between 7A-7P or the covering provider during after hours 7P-7A, please log into the web site [email or web address] and access using universal [place] password for that web site. If you do not have the password, please call the hospital operator.     [1]  Allergies Allergen Reactions   Codeine Nausea And Vomiting   Latex Rash    Oral blisters when used at dentist

## 2024-06-02 ENCOUNTER — Other Ambulatory Visit: Payer: Self-pay

## 2024-06-02 ENCOUNTER — Other Ambulatory Visit (HOSPITAL_COMMUNITY): Payer: Self-pay

## 2024-06-05 ENCOUNTER — Other Ambulatory Visit (HOSPITAL_COMMUNITY): Payer: Self-pay

## 2024-06-05 ENCOUNTER — Inpatient Hospital Stay (HOSPITAL_COMMUNITY)
Admission: EM | Admit: 2024-06-05 | Discharge: 2024-06-25 | DRG: 871 | Disposition: E | Attending: Critical Care Medicine | Admitting: Critical Care Medicine

## 2024-06-05 ENCOUNTER — Inpatient Hospital Stay (HOSPITAL_COMMUNITY)

## 2024-06-05 ENCOUNTER — Other Ambulatory Visit: Payer: Self-pay

## 2024-06-05 ENCOUNTER — Emergency Department (HOSPITAL_COMMUNITY)

## 2024-06-05 DIAGNOSIS — R5381 Other malaise: Secondary | ICD-10-CM | POA: Diagnosis present

## 2024-06-05 DIAGNOSIS — Z515 Encounter for palliative care: Secondary | ICD-10-CM

## 2024-06-05 DIAGNOSIS — R6521 Severe sepsis with septic shock: Secondary | ICD-10-CM | POA: Diagnosis present

## 2024-06-05 DIAGNOSIS — G9341 Metabolic encephalopathy: Secondary | ICD-10-CM | POA: Diagnosis present

## 2024-06-05 DIAGNOSIS — E872 Acidosis, unspecified: Secondary | ICD-10-CM | POA: Diagnosis present

## 2024-06-05 DIAGNOSIS — R579 Shock, unspecified: Secondary | ICD-10-CM | POA: Diagnosis not present

## 2024-06-05 DIAGNOSIS — E8729 Other acidosis: Secondary | ICD-10-CM

## 2024-06-05 DIAGNOSIS — J690 Pneumonitis due to inhalation of food and vomit: Secondary | ICD-10-CM | POA: Diagnosis present

## 2024-06-05 DIAGNOSIS — A419 Sepsis, unspecified organism: Principal | ICD-10-CM | POA: Diagnosis present

## 2024-06-05 DIAGNOSIS — E785 Hyperlipidemia, unspecified: Secondary | ICD-10-CM | POA: Diagnosis present

## 2024-06-05 DIAGNOSIS — E861 Hypovolemia: Secondary | ICD-10-CM | POA: Diagnosis present

## 2024-06-05 DIAGNOSIS — R64 Cachexia: Secondary | ICD-10-CM | POA: Diagnosis present

## 2024-06-05 DIAGNOSIS — Z7901 Long term (current) use of anticoagulants: Secondary | ICD-10-CM

## 2024-06-05 DIAGNOSIS — E43 Unspecified severe protein-calorie malnutrition: Secondary | ICD-10-CM | POA: Diagnosis present

## 2024-06-05 DIAGNOSIS — I5082 Biventricular heart failure: Secondary | ICD-10-CM | POA: Diagnosis present

## 2024-06-05 DIAGNOSIS — D649 Anemia, unspecified: Secondary | ICD-10-CM | POA: Diagnosis present

## 2024-06-05 DIAGNOSIS — I4891 Unspecified atrial fibrillation: Secondary | ICD-10-CM | POA: Diagnosis present

## 2024-06-05 DIAGNOSIS — E876 Hypokalemia: Secondary | ICD-10-CM | POA: Diagnosis present

## 2024-06-05 DIAGNOSIS — D509 Iron deficiency anemia, unspecified: Secondary | ICD-10-CM

## 2024-06-05 DIAGNOSIS — I5022 Chronic systolic (congestive) heart failure: Secondary | ICD-10-CM | POA: Diagnosis present

## 2024-06-05 DIAGNOSIS — G4733 Obstructive sleep apnea (adult) (pediatric): Secondary | ICD-10-CM | POA: Diagnosis present

## 2024-06-05 DIAGNOSIS — G9340 Encephalopathy, unspecified: Secondary | ICD-10-CM | POA: Diagnosis not present

## 2024-06-05 DIAGNOSIS — R578 Other shock: Secondary | ICD-10-CM | POA: Diagnosis not present

## 2024-06-05 DIAGNOSIS — R1110 Vomiting, unspecified: Secondary | ICD-10-CM | POA: Diagnosis not present

## 2024-06-05 DIAGNOSIS — Z8546 Personal history of malignant neoplasm of prostate: Secondary | ICD-10-CM

## 2024-06-05 DIAGNOSIS — D696 Thrombocytopenia, unspecified: Secondary | ICD-10-CM | POA: Diagnosis not present

## 2024-06-05 DIAGNOSIS — I110 Hypertensive heart disease with heart failure: Secondary | ICD-10-CM | POA: Diagnosis present

## 2024-06-05 DIAGNOSIS — R569 Unspecified convulsions: Secondary | ICD-10-CM

## 2024-06-05 DIAGNOSIS — N179 Acute kidney failure, unspecified: Secondary | ICD-10-CM | POA: Diagnosis present

## 2024-06-05 DIAGNOSIS — I2510 Atherosclerotic heart disease of native coronary artery without angina pectoris: Secondary | ICD-10-CM | POA: Diagnosis not present

## 2024-06-05 DIAGNOSIS — R627 Adult failure to thrive: Secondary | ICD-10-CM | POA: Diagnosis present

## 2024-06-05 DIAGNOSIS — R739 Hyperglycemia, unspecified: Secondary | ICD-10-CM | POA: Diagnosis not present

## 2024-06-05 DIAGNOSIS — Z7902 Long term (current) use of antithrombotics/antiplatelets: Secondary | ICD-10-CM

## 2024-06-05 DIAGNOSIS — Z955 Presence of coronary angioplasty implant and graft: Secondary | ICD-10-CM

## 2024-06-05 DIAGNOSIS — K5981 Ogilvie syndrome: Secondary | ICD-10-CM | POA: Diagnosis not present

## 2024-06-05 DIAGNOSIS — I252 Old myocardial infarction: Secondary | ICD-10-CM

## 2024-06-05 DIAGNOSIS — Z7951 Long term (current) use of inhaled steroids: Secondary | ICD-10-CM

## 2024-06-05 DIAGNOSIS — Z801 Family history of malignant neoplasm of trachea, bronchus and lung: Secondary | ICD-10-CM

## 2024-06-05 DIAGNOSIS — E1165 Type 2 diabetes mellitus with hyperglycemia: Secondary | ICD-10-CM | POA: Diagnosis present

## 2024-06-05 DIAGNOSIS — D75839 Thrombocytosis, unspecified: Secondary | ICD-10-CM | POA: Diagnosis present

## 2024-06-05 DIAGNOSIS — Z79899 Other long term (current) drug therapy: Secondary | ICD-10-CM

## 2024-06-05 DIAGNOSIS — Z66 Do not resuscitate: Secondary | ICD-10-CM | POA: Diagnosis present

## 2024-06-05 DIAGNOSIS — Z96652 Presence of left artificial knee joint: Secondary | ICD-10-CM | POA: Diagnosis present

## 2024-06-05 DIAGNOSIS — I5030 Unspecified diastolic (congestive) heart failure: Secondary | ICD-10-CM | POA: Diagnosis not present

## 2024-06-05 DIAGNOSIS — N400 Enlarged prostate without lower urinary tract symptoms: Secondary | ICD-10-CM | POA: Diagnosis present

## 2024-06-05 DIAGNOSIS — Z91040 Latex allergy status: Secondary | ICD-10-CM

## 2024-06-05 DIAGNOSIS — R0902 Hypoxemia: Secondary | ICD-10-CM | POA: Diagnosis not present

## 2024-06-05 DIAGNOSIS — Z885 Allergy status to narcotic agent status: Secondary | ICD-10-CM

## 2024-06-05 LAB — COMPREHENSIVE METABOLIC PANEL WITH GFR
ALT: 35 U/L (ref 0–44)
AST: 31 U/L (ref 15–41)
Albumin: 2.3 g/dL — ABNORMAL LOW (ref 3.5–5.0)
Alkaline Phosphatase: 119 U/L (ref 38–126)
Anion gap: 18 — ABNORMAL HIGH (ref 5–15)
BUN: 63 mg/dL — ABNORMAL HIGH (ref 8–23)
CO2: 18 mmol/L — ABNORMAL LOW (ref 22–32)
Calcium: 7.8 mg/dL — ABNORMAL LOW (ref 8.9–10.3)
Chloride: 104 mmol/L (ref 98–111)
Creatinine, Ser: 1.61 mg/dL — ABNORMAL HIGH (ref 0.61–1.24)
GFR, Estimated: 44 mL/min — ABNORMAL LOW
Glucose, Bld: 123 mg/dL — ABNORMAL HIGH (ref 70–99)
Potassium: 2.9 mmol/L — ABNORMAL LOW (ref 3.5–5.1)
Sodium: 139 mmol/L (ref 135–145)
Total Bilirubin: 0.5 mg/dL (ref 0.0–1.2)
Total Protein: 4.3 g/dL — ABNORMAL LOW (ref 6.5–8.1)

## 2024-06-05 LAB — PROTIME-INR
INR: 3.6 — ABNORMAL HIGH (ref 0.8–1.2)
Prothrombin Time: 37.5 s — ABNORMAL HIGH (ref 11.4–15.2)

## 2024-06-05 LAB — CBC WITH DIFFERENTIAL/PLATELET
Abs Immature Granulocytes: 0.3 K/uL — ABNORMAL HIGH (ref 0.00–0.07)
Band Neutrophils: 38 %
Basophils Absolute: 0 K/uL (ref 0.0–0.1)
Basophils Relative: 0 %
Eosinophils Absolute: 0 K/uL (ref 0.0–0.5)
Eosinophils Relative: 0 %
HCT: 33.9 % — ABNORMAL LOW (ref 39.0–52.0)
Hemoglobin: 11.4 g/dL — ABNORMAL LOW (ref 13.0–17.0)
Lymphocytes Relative: 5 %
Lymphs Abs: 0.3 K/uL — ABNORMAL LOW (ref 0.7–4.0)
MCH: 29.9 pg (ref 26.0–34.0)
MCHC: 33.6 g/dL (ref 30.0–36.0)
MCV: 89 fL (ref 80.0–100.0)
Metamyelocytes Relative: 3 %
Monocytes Absolute: 0.1 K/uL (ref 0.1–1.0)
Monocytes Relative: 2 %
Myelocytes: 1 %
Neutro Abs: 5.6 K/uL (ref 1.7–7.7)
Neutrophils Relative %: 51 %
Platelets: 507 K/uL — ABNORMAL HIGH (ref 150–400)
RBC: 3.81 MIL/uL — ABNORMAL LOW (ref 4.22–5.81)
RDW: 17.6 % — ABNORMAL HIGH (ref 11.5–15.5)
Smear Review: NORMAL
WBC: 6.3 K/uL (ref 4.0–10.5)
nRBC: 0.8 % — ABNORMAL HIGH (ref 0.0–0.2)

## 2024-06-05 LAB — URINALYSIS, W/ REFLEX TO CULTURE (INFECTION SUSPECTED)
Bacteria, UA: NONE SEEN
Glucose, UA: NEGATIVE mg/dL
Hgb urine dipstick: NEGATIVE
Ketones, ur: NEGATIVE mg/dL
Leukocytes,Ua: NEGATIVE
Nitrite: NEGATIVE
Protein, ur: NEGATIVE mg/dL
Specific Gravity, Urine: 1.025 (ref 1.005–1.030)
pH: 5 (ref 5.0–8.0)

## 2024-06-05 LAB — I-STAT VENOUS BLOOD GAS, ED
Acid-base deficit: 7 mmol/L — ABNORMAL HIGH (ref 0.0–2.0)
Bicarbonate: 17.2 mmol/L — ABNORMAL LOW (ref 20.0–28.0)
Calcium, Ion: 0.99 mmol/L — ABNORMAL LOW (ref 1.15–1.40)
HCT: 35 % — ABNORMAL LOW (ref 39.0–52.0)
Hemoglobin: 11.9 g/dL — ABNORMAL LOW (ref 13.0–17.0)
O2 Saturation: 89 %
Potassium: 2.7 mmol/L — CL (ref 3.5–5.1)
Sodium: 138 mmol/L (ref 135–145)
TCO2: 18 mmol/L — ABNORMAL LOW (ref 22–32)
pCO2, Ven: 29.6 mmHg — ABNORMAL LOW (ref 44–60)
pH, Ven: 7.373 (ref 7.25–7.43)
pO2, Ven: 57 mmHg — ABNORMAL HIGH (ref 32–45)

## 2024-06-05 LAB — I-STAT CG4 LACTIC ACID, ED
Lactic Acid, Venous: 3.5 mmol/L (ref 0.5–1.9)
Lactic Acid, Venous: 2.5 mmol/L (ref 0.5–1.9)

## 2024-06-05 LAB — CBG MONITORING, ED
Glucose-Capillary: 124 mg/dL — ABNORMAL HIGH (ref 70–99)
Glucose-Capillary: 168 mg/dL — ABNORMAL HIGH (ref 70–99)

## 2024-06-05 LAB — TROPONIN T, HIGH SENSITIVITY
Troponin T High Sensitivity: 60 ng/L — ABNORMAL HIGH (ref 0–19)
Troponin T High Sensitivity: 52 ng/L — ABNORMAL HIGH (ref 0–19)

## 2024-06-05 LAB — MAGNESIUM: Magnesium: 2.2 mg/dL (ref 1.7–2.4)

## 2024-06-05 LAB — PRO BRAIN NATRIURETIC PEPTIDE: Pro Brain Natriuretic Peptide: 9962 pg/mL — ABNORMAL HIGH

## 2024-06-05 MED ORDER — POTASSIUM CHLORIDE 10 MEQ/100ML IV SOLN
10.0000 meq | INTRAVENOUS | Status: AC
  Administered 2024-06-05 (×2): 10 meq via INTRAVENOUS
  Filled 2024-06-05 (×2): qty 100

## 2024-06-05 MED ORDER — POLYETHYLENE GLYCOL 3350 17 G PO PACK: 17.0000 g | PACK | Freq: Every day | ORAL | Status: DC | PRN

## 2024-06-05 MED ORDER — NOREPINEPHRINE 4 MG/250ML-% IV SOLN
0.0000 ug/min | INTRAVENOUS | Status: DC
  Administered 2024-06-05: 35 ug/min via INTRAVENOUS
  Administered 2024-06-05: 2 ug/min via INTRAVENOUS
  Administered 2024-06-05: 40 ug/min via INTRAVENOUS
  Administered 2024-06-06 (×2): 25 ug/min via INTRAVENOUS
  Administered 2024-06-06: 30 ug/min via INTRAVENOUS
  Filled 2024-06-05 (×6): qty 250

## 2024-06-05 MED ORDER — VASOPRESSIN 20 UNITS/100 ML INFUSION FOR SHOCK
0.0000 [IU]/min | INTRAVENOUS | Status: DC
  Administered 2024-06-05: 0.03 [IU]/min via INTRAVENOUS
  Administered 2024-06-06: 0.04 [IU]/min via INTRAVENOUS
  Administered 2024-06-06: 0.01 [IU]/min via INTRAVENOUS
  Administered 2024-06-06: 0.04 [IU]/min via INTRAVENOUS
  Administered 2024-06-06: 0.02 [IU]/min via INTRAVENOUS
  Filled 2024-06-05 (×5): qty 100

## 2024-06-05 MED ORDER — SODIUM CHLORIDE 0.9 % IV SOLN
2.0000 g | Freq: Once | INTRAVENOUS | Status: AC
  Administered 2024-06-05: 2 g via INTRAVENOUS
  Filled 2024-06-05: qty 12.5

## 2024-06-05 MED ORDER — SODIUM BICARBONATE 8.4 % IV SOLN
100.0000 meq | Freq: Once | INTRAVENOUS | Status: AC
  Administered 2024-06-05: 100 meq via INTRAVENOUS
  Filled 2024-06-05: qty 50

## 2024-06-05 MED ORDER — POTASSIUM CHLORIDE 2 MEQ/ML IV SOLN
INTRAVENOUS | Status: AC
  Filled 2024-06-05 (×2): qty 1000

## 2024-06-05 MED ORDER — METRONIDAZOLE 500 MG/100ML IV SOLN
500.0000 mg | Freq: Once | INTRAVENOUS | Status: AC
  Administered 2024-06-05: 500 mg via INTRAVENOUS
  Filled 2024-06-05: qty 100

## 2024-06-05 MED ORDER — SODIUM CHLORIDE 0.9 % IV SOLN
250.0000 mL | INTRAVENOUS | Status: AC
  Administered 2024-06-05: 250 mL via INTRAVENOUS

## 2024-06-05 MED ORDER — INSULIN ASPART 100 UNIT/ML IJ SOLN
0.0000 [IU] | INTRAMUSCULAR | Status: DC
  Administered 2024-06-05 – 2024-06-06 (×3): 1 [IU] via SUBCUTANEOUS
  Filled 2024-06-05 (×4): qty 1

## 2024-06-05 MED ORDER — SODIUM CHLORIDE 0.9 % IV SOLN
2.0000 g | Freq: Two times a day (BID) | INTRAVENOUS | Status: DC
  Administered 2024-06-06 (×2): 2 g via INTRAVENOUS
  Filled 2024-06-05 (×2): qty 12.5

## 2024-06-05 MED ORDER — SENNA 8.6 MG PO TABS: 1.0000 | ORAL_TABLET | Freq: Two times a day (BID) | ORAL | Status: DC | PRN

## 2024-06-05 MED ORDER — HYDROCORTISONE SOD SUC (PF) 100 MG IJ SOLR
100.0000 mg | Freq: Once | INTRAMUSCULAR | Status: AC
  Administered 2024-06-05: 100 mg via INTRAVENOUS
  Filled 2024-06-05: qty 2

## 2024-06-05 MED ORDER — CHLORHEXIDINE GLUCONATE CLOTH 2 % EX PADS
6.0000 | MEDICATED_PAD | Freq: Every day | CUTANEOUS | Status: DC
  Administered 2024-06-05 – 2024-06-06 (×2): 6 via TOPICAL

## 2024-06-05 MED ORDER — ALBUMIN HUMAN 25 % IV SOLN
25.0000 g | Freq: Once | INTRAVENOUS | Status: AC
  Administered 2024-06-05: 25 g via INTRAVENOUS
  Filled 2024-06-05: qty 100

## 2024-06-05 MED ORDER — IOHEXOL 350 MG/ML SOLN
75.0000 mL | Freq: Once | INTRAVENOUS | Status: AC | PRN
  Administered 2024-06-05: 75 mL via INTRAVENOUS

## 2024-06-05 MED ORDER — VANCOMYCIN HCL IN DEXTROSE 1-5 GM/200ML-% IV SOLN
1000.0000 mg | INTRAVENOUS | Status: DC
  Administered 2024-06-06: 1000 mg via INTRAVENOUS
  Filled 2024-06-05: qty 200

## 2024-06-05 MED ORDER — SODIUM CHLORIDE 0.9 % IV BOLUS (SEPSIS): 500.0000 mL | Freq: Once | INTRAVENOUS | Status: DC

## 2024-06-05 MED ORDER — SODIUM CHLORIDE 0.9 % IV BOLUS (SEPSIS)
1000.0000 mL | Freq: Once | INTRAVENOUS | Status: AC
  Administered 2024-06-05: 1000 mL via INTRAVENOUS

## 2024-06-05 MED ORDER — SODIUM CHLORIDE 0.9 % IV BOLUS
1000.0000 mL | Freq: Once | INTRAVENOUS | Status: AC
  Administered 2024-06-05: 1000 mL via INTRAVENOUS

## 2024-06-05 MED ORDER — ONDANSETRON HCL 4 MG/2ML IJ SOLN: 4.0000 mg | Freq: Four times a day (QID) | INTRAMUSCULAR | Status: DC | PRN

## 2024-06-05 MED ORDER — METRONIDAZOLE 500 MG/100ML IV SOLN
500.0000 mg | Freq: Two times a day (BID) | INTRAVENOUS | Status: DC
  Administered 2024-06-05 – 2024-06-06 (×2): 500 mg via INTRAVENOUS
  Filled 2024-06-05 (×3): qty 100

## 2024-06-05 MED ORDER — VANCOMYCIN HCL IN DEXTROSE 1-5 GM/200ML-% IV SOLN: 1000.0000 mg | Freq: Once | INTRAVENOUS | Status: DC

## 2024-06-05 MED ORDER — VANCOMYCIN HCL 1500 MG/300ML IV SOLN
1500.0000 mg | Freq: Once | INTRAVENOUS | Status: AC
  Administered 2024-06-05: 1500 mg via INTRAVENOUS
  Filled 2024-06-05: qty 300

## 2024-06-05 NOTE — ED Notes (Signed)
 Albumin  started late as it is not compatible with the other medications and there is not an available IV open to use. Will attempt to get a third peripheral, per MD marshall central line will be put in when pt gets to ICU.

## 2024-06-05 NOTE — ED Notes (Signed)
Bair Hugger in place

## 2024-06-05 NOTE — ED Provider Notes (Signed)
 Blood pressure (!) 87/54, pulse 61, temperature (!) 95.2 F (35.1 C), temperature source Rectal, resp. rate 20, height 6' (1.829 m), weight 71.2 kg, SpO2 96%.  Assuming care from Dr. Darra.  In short, Chase Combs is a 79 y.o. male with a chief complaint of Hypotension .  Refer to the original H&P for additional details.  The current plan of care is to f/u on labs and CT chest/abd/pelvis.  Lactic acid elevated to 3.5 --> 2.5. CBC without leukocytosis. No AKI. UA without infection. Troponin mildly elevated in the setting of hypotension. Suspect demand ischemia rather than prior ACS.  Discussed patient's case with ICU, Dr. Layman to request admission. Patient and family (if present) updated with plan. Care transferred to ICU service.  I reviewed all nursing notes, vitals, pertinent old records, EKGs, labs, imaging (as available).  CRITICAL CARE Performed by: Fonda KANDICE Darra Total critical care time: 45 minutes Critical care time was exclusive of separately billable procedures and treating other patients. Critical care was necessary to treat or prevent imminent or life-threatening deterioration. Critical care was time spent personally by me on the following activities: development of treatment plan with patient and/or surrogate as well as nursing, discussions with consultants, evaluation of patient's response to treatment, examination of patient, obtaining history from patient or surrogate, ordering and performing treatments and interventions, ordering and review of laboratory studies, ordering and review of radiographic studies, pulse oximetry and re-evaluation of patient's condition.  Fonda Darra, MD Emergency Medicine      Kloey Cazarez, Fonda KANDICE, MD 06/05/24 2102

## 2024-06-05 NOTE — ED Notes (Signed)
 Pt having increasing PVCs, having more frequent runs of bigeminy and then will be NSR for a minute before consistent bigeminy again. This has changed from earlier when the PVCs were not as consistent. MD marshall aware and no new orders at this time

## 2024-06-05 NOTE — ED Provider Notes (Signed)
 " [place] EMERGENCY DEPARTMENT AT [facility] Provider Note   CSN: [id number] Arrival date & time: 06/05/24  1444     Patient presents with: Hypotension   Chase Combs is a 79 y.o. male.   HPI Patient presents for lethargy.  Medical history includes prostate cancer CAD, arthritis, BPH, DM, HLD, HTN, OSA, chronic ileus/Ogilvie syndrome.  He is prescribed Eliquis .  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  He was discharged to a SNF.  3 weeks ago, he presented to the emergency department for worsened abdominal distention, nausea, vomiting, and diarrhea.  CT scan at the time showed concern of large bowel obstruction.  GI was consulted but patient was managed conservatively with NG tube decompression.  He was discharged home on M[place]inon  and advised to continue MiraLAX .  Today, family reported to EMS that he has had ongoing abdominal distention since his prior hospital discharge.  He has become increasingly lethargic.  When EMS arrived on scene, patient was hypotensive in the range of 80s over 40s.  While transferring him to a stretcher, he had a witnessed syncopal episode.  Repeat blood pressure was in the 60s SBP.  Patient remained alert and oriented.  Patient denies any current areas of discomfort.  He endorses a generalized weakness and fatigue.    Prior to Admission medications  Medication Sig Start Date End Date Taking? Authorizing Provider  Amino Acids -Protein Hydrolys (PRO-STAT) LIQD Take 30 mLs by mouth in the morning and at bedtime. 05/15/24   [provider]  amiodarone  (PACERONE ) 200 MG tablet Take 1 tablet (200 mg total) by mouth daily. 04/07/24   Milford, Harlene HERO, FNP  apixaban  (ELIQUIS ) 5 MG TABS tablet Take 1 tablet (5 mg total) by mouth 2 (two) times daily. 03/23/24   Colletta Manuelita Garre, PA-C  barrier cream (NON-SPECIFIED) CREA Apply 1 Application topically in the morning, at noon, and at bedtime. Apply to  buttocks every shift for skin protection    [provider]  budesonide  (PULMICORT ) 0.25 MG/2ML nebulizer solution Take 2 mLs (0.25 mg total) by nebulization 2 (two) times daily. 05/11/24   Sebastian Toribio GAILS, MD  Calcium  Carbonate (OYSTER SHELL CALCIUM  PO) Take 500 mg by mouth every morning.    [provider]  clopidogrel  (PLAVIX ) 75 MG tablet Take 1 tablet (75 mg total) by mouth daily. 03/23/24   Colletta Manuelita Garre, PA-C  cyanocobalamin  (VITAMIN B12) 1000 MCG tablet Take 1,000 mcg by mouth daily.    [provider]  hydrOXYzine  (ATARAX ) 25 MG tablet Take 25 mg by mouth at bedtime as needed for anxiety. 03/27/24   [provider]  ipratropium (ATROVENT ) 0.02 % nebulizer solution Take 2.5 mLs (0.5 mg total) by nebulization 2 (two) times daily. 05/11/24   Sebastian Toribio GAILS, MD  Multiple Vitamins-Minerals (MULTIVITAMIN MEN 50+) TABS Take 1 tablet by mouth daily.    [provider]  nitroGLYCERIN  (NITROSTAT ) 0.4 MG SL tablet Place 0.4 mg under the tongue every 5 (five) minutes as needed for chest pain. Give one tablet (0.4mg ) sublingually every 5 minutes as needed for chest pain for up to 3 doses. 01/19/24   [provider]  ondansetron  (ZOFRAN ) 8 MG tablet Take 8 mg by mouth every 8 (eight) hours as needed for nausea or vomiting. 02/29/24   [provider]  pantoprazole  (PROTONIX ) 40 MG tablet Take 1 tablet (40 mg total) by mouth daily. 02/24/24   Arrien, Elidia Toribio, MD  polyethylene glycol powder (GLYCOLAX /MIRALAX ) 17 GM/SCOOP powder Dissolve 1 capful (17g) in 4-8 ounces of liquid and take by mouth 2 (two) times daily. 06/01/24   Hongalgi, Anand D, MD  pyridostigmine  (MESTINON ) 60 MG tablet Take 0.5 tablets (30 mg total) by mouth 2 (two) times daily. 06/01/24   Hongalgi, Anand D, MD  revefenacin  (YUPELRI ) 175 MCG/3ML nebulizer solution Take 3 mLs (175 mcg total) by nebulization daily. 05/12/24   Sebastian Toribio GAILS, MD  rosuvastatin   (CRESTOR ) 20 MG tablet TAKE 1 TABLET(20 MG) BY MOUTH DAILY Patient taking differently: Take 20 mg by mouth at bedtime. 11/29/23   Anner Alm ORN, MD  saccharomyces boulardii (FLORASTOR) 250 MG capsule Take 500 mg by mouth 2 (two) times daily.    [provider]  sertraline  (ZOLOFT ) 50 MG tablet Take 50 mg by mouth every morning.    [provider]  tamsulosin  (FLOMAX ) 0.4 MG CAPS capsule Take 1 capsule (0.4 mg total) by mouth daily after supper. Patient taking differently: Take 0.4 mg by mouth at bedtime. 11/17/21   Patrcia Cough, MD    Allergies: Codeine and Latex    Review of Systems  Constitutional:  Positive for fatigue.  Gastrointestinal:  Positive for abdominal distention, nausea and vomiting.  Neurological:  Positive for syncope and weakness (Generalized).  All other systems reviewed and are negative.   Updated Vital Signs BP (!) 87/54   Pulse 61   Temp (!) 95.2 F (35.1 C) (Rectal)   Resp 20   Ht 6' (1.829 m)   Wt 71.2 kg   SpO2 96%   BMI 21.29 kg/m   Physical Exam Vitals and nursing note reviewed.  Constitutional:      General: He is not in acute distress.    Appearance: Normal appearance. He is well-developed. He is ill-appearing. He is not toxic-appearing or diaphoretic.  HENT:     Head: Normocephalic and atraumatic.     Right Ear: External ear normal.     Left Ear: External ear normal.     Nose: Nose normal.     Mouth/Throat:     Mouth: Mucous membranes are moist.  Eyes:     Extraocular Movements: Extraocular movements intact.     Conjunctiva/sclera: Conjunctivae normal.  Cardiovascular:     Rate and Rhythm: Normal rate and regular rhythm.     Heart sounds: No murmur heard. Pulmonary:     Effort: Pulmonary effort is normal. No respiratory distress.     Breath sounds: No wheezing or rales.  Abdominal:     General: There is distension.     Palpations: Abdomen is soft.     Tenderness: There is no abdominal tenderness.   Musculoskeletal:        General: No swelling.     Cervical back: Normal range of motion and neck supple.  Skin:    General: Skin is warm and dry.     Coloration: Skin is pale. Skin is not jaundiced.  Neurological:     General: No focal deficit present.     Mental Status: He is alert and oriented to person, place, and time.  Psychiatric:        Mood and Affect: Mood normal.        Behavior: Behavior normal.     (all labs ordered are listed, but only abnormal results are displayed) Labs Reviewed  I-STAT CG4 LACTIC ACID, ED - Abnormal; Notable for the following components:      Result Value   Lactic Acid, Venous  3.5 (*)    All other components within normal limits  I-STAT VENOUS BLOOD GAS, ED - Abnormal; Notable for the following components:   pCO2, Ven 29.6 (*)    pO2, Ven 57 (*)    Bicarbonate 17.2 (*)    TCO2 18 (*)    Acid-base deficit 7.0 (*)    Potassium 2.7 (*)    Calcium , Ion 0.99 (*)    HCT 35.0 (*)    Hemoglobin 11.9 (*)    All other components within normal limits  CBG MONITORING, ED - Abnormal; Notable for the following components:   Glucose-Capillary 124 (*)    All other components within normal limits  CULTURE, BLOOD (ROUTINE X 2)  CULTURE, BLOOD (ROUTINE X 2)  COMPREHENSIVE METABOLIC PANEL WITH GFR  CBC WITH DIFFERENTIAL/PLATELET  PROTIME-INR  URINALYSIS, W/ REFLEX TO CULTURE (INFECTION SUSPECTED)  PRO BRAIN NATRIURETIC PEPTIDE  MAGNESIUM   TROPONIN T, HIGH SENSITIVITY    EKG: EKG Interpretation Date/Time:  Monday June 05 2024 15:08:45 EST Ventricular Rate:  72 PR Interval:  64 QRS Duration:  224 QT Interval:  549 QTC Calculation: 601 R Axis:   -86  Text Interpretation: Sinus rhythm Multiform ventricular premature complexes Short PR interval 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   Procedures   Medications Ordered in the ED  sodium chloride  0.9  % bolus 1,000 mL (1,000 mLs Intravenous New Bag/Given 06/05/24 1523)  norepinephrine  (LEVOPHED ) 4mg  in (0.016 mg/mL) premix infusion (18 mcg/min Intravenous Rate/Dose Change 06/05/24 1610)  ceFEPIme  (MAXIPIME ) 2 g in sodium chloride  0.9 % 100 mL IVPB (has no administration in time range)  metroNIDAZOLE  (FLAGYL ) IVPB 500 mg (has no administration in time range)  hydrocortisone  sodium succinate  (SOLU-CORTEF ) 100 MG injection 100 mg (has no administration in time range)  vancomycin  (VANCOREADY) IVPB 1500 mg/300 mL (has no administration in time range)                                    Medical Decision Making Amount and/or Complexity of Data Reviewed Labs: ordered. Radiology: ordered.  Risk Prescription drug management.   This patient presents to the ED for concern of lethargy, this involves an extensive number of treatment options, and is a complaint that carries with it a high risk of complications and morbidity.  The differential diagnosis includes sepsis, polypharmacy, deconditioning, CHF, anemia   Co morbidities / Chronic conditions that complicate the patient evaluation  prostate cancer CAD, arthritis, BPH, DM, HLD, HTN, OSA, chronic ileus/Ogilvie syndrome   Additional history obtained:  Additional history obtained from EMR External records from outside source obtained and reviewed including EMS   Lab Tests:  I Ordered, and personally interpreted labs.  The pertinent results include: Initial lab work notable for lactic acidosis.  Remaining lab work pending at time of signout.   Imaging Studies ordered:  I ordered imaging studies including x-ray of chest and abdomen. I independently visualized and interpreted imaging which showed (pending at time of signout) I agree with the radiologist interpretation   Cardiac Monitoring: / EKG:  The patient was maintained on a cardiac monitor.  I personally viewed and interpreted the cardiac monitored which showed an underlying  rhythm of: Sinus rhythm   Problem List / ED Course / Critical interventions / Medication management  Patient presenting for ongoing abdominal distention, generalized weakness, fatigue,  hypotension and a witnessed syncopal episode with EMS.  On arrival he is alert and oriented.  Initial blood pressure is in the normal range, despite no interventions given by EMS.  Patient does have a markedly distended abdomen.  Despite this, he denies any current areas of discomfort.  IV fluids were ordered.  Workup was initiated.  Patient had recurrence of hypotension while in the ED.  Levophed  was ordered.  He was found to be hypothermic.  Broad-spectrum antibiotics and stress dose steroid were ordered.  Lab work and imaging pending at time of signout.  Care of patient signed out to oncoming ED provider. I ordered medication including IV fluids for hydration, Levophed  and Solu-Cortef  for hypotension, broad-spectrum antibiotics for empiric treatment of sepsis Reevaluation of the patient after these medicines showed that the patient improved I have reviewed the patients home medicines and have made adjustments as needed   Social Determinants of Health:  Lives at home with family  CRITICAL CARE Performed by: Bernardino Fireman   Total critical care time: 32 minutes  Critical care time was exclusive of separately billable procedures and treating other patients.  Critical care was necessary to treat or prevent imminent or life-threatening deterioration.  Critical care was time spent personally by me on the following activities: development of treatment plan with patient and/or surrogate as well as nursing, discussions with consultants, evaluation of patient's response to treatment, examination of patient, obtaining history from patient or surrogate, ordering and performing treatments and interventions, ordering and review of laboratory studies, ordering and review of radiographic studies, pulse oximetry and  re-evaluation of patient's condition.     Final diagnoses:  Shock [facility])    ED Discharge Orders     None          Fireman Bernardino, MD 06/05/24 1612  "

## 2024-06-05 NOTE — Progress Notes (Signed)
 Pharmacy Antibiotic Note  Chase Combs is a 79 y.o. male admitted on 06/05/2024 with sepsis.  Pharmacy has been consulted for vancomycin  dosing.  Vancomycin  1500 mg IV x 1 given in ED  Plan: Vancomycin  1g IV q 24h (eAUC 526) Monitor renal function, Cx and clinical progression to narrow Vancomycin  levels as indicated  Height: 6' (182.9 cm) Weight: 71.2 kg (157 lb) IBW/kg (Calculated) : 77.6  Temp (24hrs), Avg:95.3 F (35.2 C), Min:95.2 F (35.1 C), Max:95.3 F (35.2 C)  Recent Labs  Lab 05/31/24 0415 06/01/24 0627 06/05/24 1531 06/05/24 1543 06/05/24 1842  WBC  --   --  6.3  --   --   CREATININE 0.73 0.77 1.61*  --   --   LATICACIDVEN  --   --   --  3.5* 2.5*    Estimated Creatinine Clearance: 39.3 mL/min (A) (by C-G formula based on SCr of 1.61 mg/dL (H)).    Allergies[1]  Dorn Poot, PharmD, [facility] Clinical Pharmacist ED Pharmacist Phone # [phone] 06/05/2024 8:22 PM     [1]  Allergies Allergen Reactions   Codeine Nausea And Vomiting   Latex Rash    Oral blisters when used at dentist

## 2024-06-05 NOTE — ED Notes (Signed)
 MD marshall messaged about pt maxed out on levo and vaso with MAP staying 65-75. MD marshall advised for SBP goal >100 rather than map dt known HF

## 2024-06-05 NOTE — ED Triage Notes (Signed)
 Pt Chase Combs [facility] for hypotension, malaise and potential intestinal block. 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 Witnessed syncopal episode at the home with EMS. Oral intake decreased for past week with intermittent vomiting.   BP 80s systolic, 68/40 last BP, HR 72, 98% RA, 150 CBG, 15 RR

## 2024-06-05 NOTE — ED Notes (Signed)
 Warm blankets applied, bair hugger in place

## 2024-06-05 NOTE — H&P (Addendum)
 "  NAME:  Chase Combs, MRN:  [id number], DOB:  10-08-1945, LOS: 0 ADMISSION DATE:  06/05/2024, CONSULTATION DATE:  06/05/24 REFERRING MD:  EDP, CHIEF COMPLAINT:  syncope  History of Present Illness:  79 yo male presented to [facility] after near syncopal event. Found to be hypotensive. Received 1L fluid and then started on vasopressors, escalated to 15mcg.   Pt has known intestinal blockage and follows with GI. He was recently seen (last week) by GI. Pt reports decreased PO intake for past week with intermittent vomiting. Today he presented after continued malaise and syncopal event. He was found to be hypotensive with SBP in 80's.   Per chart review pt was admitted to hospital for 3 weeks for his chronic obstruction. He was subsequently discharged to SNF and then released home. Pt presented back to hospital 12/26 with similar symptoms and discharged again to home on 1/8. During this admission GI recommended conservative treatment. He was provided with fluids, reglan , NG and rectal tube and continued on Mestinon .   Wife at bedside and endorses that pt has been vomiting at home with poor po intake after the first day. He has been laying down vomiting as well and wife feels aspiration is very possible. He has continued with loose stool since discharge 2/2 medication per wife. Today he has been sleeping all day and non verbal as opposed to yesterday when he was interactive. Denies any sick contacts since he has arrived home. No noted fevers/chills. No complaints of abdominal pain but wife states she has noted his belly becoming increasingly distended.   Pertinent  Medical History  Prostate cancer Cad HFrEF (LVEF 20-25%) Afib on chronic eliquis  Arthritis Bph Dm2 with hyperglycemia Hyperlipidemia Htn Osa with cpap intolerance Chronic ileus/Ogilvie syndrome  Significant Hospital Events: Including procedures, antibiotic start and stop dates in addition to other pertinent events   Admitted to [facility]  06/05/24  Interim History / Subjective:    Objective    Blood pressure (!) 124/100, pulse 78, temperature (!) 95.3 F (35.2 C), temperature source Rectal, resp. rate 20, height 6' (1.829 m), weight 71.2 kg, SpO2 98%.        Intake/Output Summary (Last 24 hours) at 06/05/2024 1920 Last data filed at 06/05/2024 1817 Gross per 24 hour  Intake --  Output 50 ml  Net -50 ml   Filed Weights   06/05/24 1458  Weight: 71.2 kg    Examination: General: chronically ill appearing, sleeping in reclined position, arousable, will open eyes yet non verbal HENT: ncat, eomi, perrla, sclera anicteric Lungs: ctab Cardiovascular: + systolic murmur irreg Abdomen: distended, non tender, no rebound/guarding, bs severely diminished, somewhat firm to palpation Extremities: no c/c/e + purple discoloration to L knee Neuro: arousable but only opens eyes. Not following commands GU: deferred  Resolved problem list   Assessment and Plan  Syncope Shock, presumed sepsis of undetermined etiology Suspected aspiration vomiting Lactic acidosis Aki (baseline 0.7) hypokalemia HFrEF (LVEF 20-25%) Chronic obstruction Failure to thrive, cachectic Chronic normocytic anemia Thrombocytosis -syncope 2/2 hypotension in setting of sepsis, hypovolemia and HFrEF -cont with volume, will give albumin  as well -on 30mcg norepi, will add vaso -will need cvc and arterial line, wife consented in ED -echo in am -NPO, NG to intermittent low suction -consult GI in am, noted imaging  -zofran  prn -lactate improving -empiric abx -f/u cx -cxr in am to follow opacities on ct and potential blossoming pna -follow renal function, I/o -replace potassium, mag adequate -2amp bicarb now -hold a/c for  today -lengthy d/w family at bedside re: escalating pressors and goc. At this time will cont with vasopressors, cvc and arterial line placement. They will call family. Understand the next 24-48 hours are crucial to see improvement  but also recognize the multiple/lengthy hospitalizations are extremely concerning.     Labs   CBC: Recent Labs  Lab 06/05/24 1531 06/05/24 1543  WBC 6.3  --   NEUTROABS 5.6  --   HGB 11.4* 11.9*  HCT 33.9* 35.0*  MCV 89.0  --   PLT 507*  --     Basic Metabolic Panel: Recent Labs  Lab 05/31/24 0415 06/01/24 0627 06/05/24 1531 06/05/24 1543  NA 136 134* 139 138  K 5.2* 3.9 2.9* 2.7*  CL 106 104 104  --   CO2 24 22 18*  --   GLUCOSE 86 81 123*  --   BUN 18 19 63*  --   CREATININE 0.73 0.77 1.61*  --   CALCIUM  8.2* 8.1* 7.8*  --   MG 1.8  --  2.2  --    GFR: Estimated Creatinine Clearance: 39.3 mL/min (A) (by C-G formula based on SCr of 1.61 mg/dL (H)). Recent Labs  Lab 06/05/24 1531 06/05/24 1543 06/05/24 1842  WBC 6.3  --   --   LATICACIDVEN  --  3.5* 2.5*    Liver Function Tests: Recent Labs  Lab 06/05/24 1531  AST 31  ALT 35  ALKPHOS 119  BILITOT 0.5  PROT 4.3*  ALBUMIN  2.3*   No results for input(s): LIPASE, AMYLASE in the last 168 hours. No results for input(s): AMMONIA in the last 168 hours.  ABG    Component Value Date/Time   PHART 7.36 04/28/2024 1805   PCO2ART 34 04/28/2024 1805   PO2ART 97 04/28/2024 1805   HCO3 17.2 (L) 06/05/2024 1543   TCO2 18 (L) 06/05/2024 1543   ACIDBASEDEF 7.0 (H) 06/05/2024 1543   O2SAT 89 06/05/2024 1543     Coagulation Profile: Recent Labs  Lab 06/05/24 1531  INR 3.6*    Cardiac Enzymes: No results for input(s): CKTOTAL, CKMB, CKMBINDEX, TROPONINI in the last 168 hours.  HbA1C: Hgb A1c MFr Bld  Date/Time Value Ref Range Status  04/22/2024 06:07 AM 5.5 4.8 - 5.6 % Final    Comment:    (NOTE)         Prediabetes: 5.7 - 6.4         Diabetes: >6.4         Glycemic control for adults with diabetes: <7.0   09/22/2023 08:46 AM 6.4 (H) 4.8 - 5.6 % Final    Comment:    (NOTE) Pre diabetes:          5.7%-6.4%  Diabetes:              >6.4%  Glycemic control for   <7.0% adults with  diabetes     CBG: Recent Labs  Lab 06/05/24 1514  GLUCAP 124*    Review of Systems:   As per HPI. Limited 2/2 pt's mental status  Past Medical History:  He,  has a past medical history of Abdominal hernia, Arthritis, Benign localized prostatic hyperplasia with lower urinary tract symptoms (LUTS), CAD (coronary artery disease) (05/2004), DM II (diabetes mellitus, type II), controlled (HCC), Dyslipidemia, Essential hypertension, History of ST elevation myocardial infarction (STEMI) (02/16/2009), 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(HCC) (04/2021), OSA (obstructive sleep apnea), PONV (postoperative nausea and vomiting), S/p bare metal coronary artery stent, and Status post total knee replacement,  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   Surgical History:   Past Surgical History:  Procedure Laterality Date   BOWEL DECOMPRESSION N/A 02/18/2024   Procedure: DECOMPRESSION, INTESTINE;  Surgeon: Albertus Gordy HERO, MD;  Location: [facility] ENDOSCOPY;  Service: Gastroenterology;  Laterality: N/A;   CARDIAC CATHETERIZATION  05/27/2004   outpatient by dr burnard [facility] cardiology) for positive stress test;  80% stensis pRI   CARDIOVERSION N/A 02/21/2024   Procedure: CARDIOVERSION;  Surgeon: Cherrie Toribio SAUNDERS, MD;  Location: [facility] INVASIVE CV LAB;  Service: Cardiovascular;  Laterality: N/A;   COLONOSCOPY N/A 02/18/2024   Procedure: COLONOSCOPY;  Surgeon: Albertus Gordy HERO, MD;  Location: [facility] ENDOSCOPY;  Service: Gastroenterology;  Laterality: N/A;   CORONARY ANGIOPLASTY WITH STENT PLACEMENT  06/18/2004   @[facility]  by Dr Lavon;  PCI w/ BMS to pRI  (CoStar study stent - 2.5 x 16)   CORONARY ANGIOPLASTY WITH STENT PLACEMENT  02/16/2009   @[facility]  by dr verlin;   Inferior STEMI:  PCI w/ thrombectomy total occluded RCA, BMS x1 to pRCA (Vision BMS 3.5 x 23 -> 4.0 mm), residual 40-50% pLAD, ef 45-50%   CORONARY PRESSURE/FFR STUDY N/A 01/17/2024   Procedure: CORONARY PRESSURE/FFR STUDY;  Surgeon: Mady Bruckner, MD;  Location: [facility] INVASIVE CV LAB;   Service: Cardiovascular;  Laterality: N/A;   CORONARY STENT INTERVENTION N/A 01/17/2024   Procedure: CORONARY STENT INTERVENTION;  Surgeon: Mady Bruckner, MD;  Location: [facility] INVASIVE CV LAB;  Service: Cardiovascular;  Laterality: N/A;   CORONARY ULTRASOUND/IVUS N/A 01/17/2024   Procedure: Coronary Ultrasound/IVUS;  Surgeon: Mady Bruckner, MD;  Location: [facility] INVASIVE CV LAB;  Service: Cardiovascular;  Laterality: N/A;   CORONARY/GRAFT ACUTE MI REVASCULARIZATION N/A 02/01/2024   Procedure: Coronary/Graft Acute MI Revascularization;  Surgeon: Elmira Newman PARAS, MD;  Location: [facility] IN[facility] CV LAB;  Service: Cardiovascular;  Laterality: N/A;   ESOPHAGOGASTRODUODENOSCOPY N/A 02/02/2024   Procedure: EGD (ESOPHAGOGASTRODUODENOSCOPY);  Surgeon: Nandigam, Kavitha V, MD;  Location: [facility] ENDOSCOPY;  Service: Gastroenterology;  Laterality: N/A;   FLEXIBLE SIGMOIDOSCOPY N/A 10/20/2023   Procedure: KINGSTON SIDE;  Surgeon: Legrand Victory LITTIE DOUGLAS, MD;  Location: [facility] ENDOSCOPY;  Service: Gastroenterology;  Laterality: N/A;   FLEXIBLE SIGMOIDOSCOPY N/A 02/02/2024   Procedure: SIGMOIDOSCOPY, FLEXIBLE;  Surgeon: Shila Gustav GAILS, MD;  Location: [facility] ENDOSCOPY;  Service: Gastroenterology;  Laterality: N/A;   GOLD SEED IMPLANT N/A 09/19/2021   Procedure: GOLD SEED IMPLANT;  Surgeon: Cam Morene ORN, MD;  Location: [facility];  Service: Urology;  Laterality: N/A;  ONLY NEEDS 30 MIN FOR ALL   INGUINAL HERNIA REPAIR Right 09/02/1999   @[facility] ;   recurrent repair right inguinal hernia;   previous repair approx 1990s   INGUINAL HERNIA REPAIR Left    1990s   KNEE ARTHROSCOPY W/ MENISCAL REPAIR Right 12/18/2005   @[facility]    LUMBAR DISC SURGERY  1982   L5--S1   RIGHT/LEFT HEART CATH AND CORONARY ANGIOGRAPHY N/A 01/17/2024   Procedure: RIGHT/LEFT HEART CATH AND CORONARY ANGIOGRAPHY;  Surgeon: Mady Bruckner, MD;  Location: [facility] INVASIVE CV LAB;  Service: Cardiovascular;  Laterality: N/A;   SPACE OAR  INSTILLATION N/A 09/19/2021   Procedure: SPACE OAR INSTILLATION;  Surgeon: Cam Morene ORN, MD;  Location: [facility];  Service: Urology;  Laterality: N/A;   TOTAL KNEE ARTHROPLASTY Left 10/01/2023   Procedure: ARTHROPLASTY, KNEE, TOTAL;  Surgeon: Kay Kemps, MD;  Location: [facility] ORS;  Service: Orthopedics;  Laterality: Left;   TRANSESOPHAGEAL ECHOCARDIOGRAM (CATH LAB) N/A 02/21/2024   Procedure: TRANSESOPHAGEAL ECHOCARDIOGRAM;  Surgeon: Cherrie Toribio SAUNDERS, MD;  Location: [facility] INVASIVE CV LAB;  Service: Cardiovascular;  Laterality: N/A;     Social History:   reports that he has never smoked. He has never used smokeless tobacco. He reports that he does not drink alcohol  and does not use drugs.   Family History:  His family history includes Lung cancer in his mother. There is no history of Colon cancer or Esophageal cancer.   Allergies Allergies[1]   Home Medications  Prior to Admission medications  Medication Sig Start Date End Date Taking? Authorizing Provider  Amino Acids -Protein Hydrolys (PRO-STAT) LIQD Take 30 mLs by mouth in the morning and at bedtime. 05/15/24   [provider]  amiodarone  (PACERONE ) 200 MG tablet Take 1 tablet (200 mg total) by mouth daily. 04/07/24   Milford, Harlene HERO, FNP  apixaban  (ELIQUIS ) 5 MG TABS tablet Take 1 tablet (5 mg total) by mouth 2 (two) times daily. 03/23/24   Colletta Manuelita Garre, PA-C  barrier cream (NON-SPECIFIED) CREA Apply 1 Application topically in the morning, at noon, and at bedtime. Apply to buttocks every shift for skin protection    [provider]  budesonide  (PULMICORT ) 0.25 MG/2ML nebulizer solution Take 2 mLs (0.25 mg total) by nebulization 2 (two) times daily. 05/11/24   Sebastian Toribio GAILS, MD  Calcium  Carbonate (OYSTER SHELL CALCIUM  PO) Take 500 mg by mouth every morning.    [provider]  clopidogrel  (PLAVIX ) 75 MG tablet Take 1 tablet (75 mg total) by mouth daily. 03/23/24   Colletta Manuelita Garre, PA-C  cyanocobalamin  (VITAMIN B12) 1000 MCG tablet Take 1,000 mcg by mouth daily.    [provider]  hydrOXYzine  (ATARAX ) 25 MG tablet Take 25 mg by mouth at bedtime as needed for anxiety. 03/27/24   [provider]  ipratropium (ATROVENT ) 0.02 % nebulizer solution Take 2.5 mLs (0.5 mg total) by nebulization 2 (two) times daily. 05/11/24   Sebastian Toribio GAILS, MD  Multiple Vitamins-Minerals (MULTIVITAMIN MEN 50+) TABS Take 1 tablet by mouth daily.    [provider]  nitroGLYCERIN  (NITROSTAT ) 0.4 MG SL tablet Place 0.4 mg under the tongue every 5 (five) minutes as needed for chest pain. Give one tablet (0.4mg ) sublingually every 5 minutes as needed for chest pain for up to 3 doses. 01/19/24   [provider]  ondansetron  (ZOFRAN ) 8 MG tablet Take 8 mg by mouth every 8 (eight) hours as needed for nausea or vomiting. 02/29/24   [provider]  pantoprazole  (PROTONIX ) 40 MG tablet Take 1 tablet (40 mg total) by mouth daily. 02/24/24   Arrien, Mauricio Daniel, MD  polyethylene glycol powder (GLYCOLAX /MIRALAX ) 17 GM/SCOOP powder Dissolve 1 capful (17g) in 4-8 ounces of liquid and take by mouth 2 (two) times daily. 06/01/24   Hongalgi, Anand D, MD  pyridostigmine  (MESTINON ) 60 MG tablet Take 0.5 tablets (30 mg total) by mouth 2 (two) times daily. 06/01/24   Hongalgi, Anand D, MD  revefenacin  (YUPELRI ) 175 MCG/3ML nebulizer solution Take 3 mLs (175 mcg total) by nebulization daily. 05/12/24   Sebastian Toribio GAILS, MD  rosuvastatin  (CRESTOR ) 20 MG tablet TAKE 1 TABLET(20 MG) BY MOUTH DAILY Patient taking differently: Take 20 mg by mouth at bedtime. 11/29/23   Anner Alm ORN, MD  saccharomyces boulardii (FLORASTOR) 250 MG capsule Take 500 mg by mouth 2 (two) times daily.    [provider]  sertraline  (ZOLOFT ) 50 MG tablet Take 50 mg by mouth every morning.    [provider]  tamsulosin  (FLOMAX ) 0.4 MG  CAPS capsule Take 1 capsule (0.4 mg  total) by mouth daily after supper. Patient taking differently: Take 0.4 mg by mouth at bedtime. 11/17/21   Patrcia Cough, MD     Critical care time:               [1]  Allergies Allergen Reactions   Codeine Nausea And Vomiting   Latex Rash    Oral blisters when used at dentist   "

## 2024-06-06 ENCOUNTER — Inpatient Hospital Stay (HOSPITAL_COMMUNITY)

## 2024-06-06 DIAGNOSIS — R627 Adult failure to thrive: Secondary | ICD-10-CM | POA: Diagnosis not present

## 2024-06-06 DIAGNOSIS — N179 Acute kidney failure, unspecified: Secondary | ICD-10-CM | POA: Diagnosis not present

## 2024-06-06 DIAGNOSIS — G9340 Encephalopathy, unspecified: Secondary | ICD-10-CM

## 2024-06-06 DIAGNOSIS — R579 Shock, unspecified: Secondary | ICD-10-CM | POA: Diagnosis not present

## 2024-06-06 DIAGNOSIS — I5030 Unspecified diastolic (congestive) heart failure: Secondary | ICD-10-CM

## 2024-06-06 DIAGNOSIS — A419 Sepsis, unspecified organism: Principal | ICD-10-CM

## 2024-06-06 DIAGNOSIS — G9341 Metabolic encephalopathy: Secondary | ICD-10-CM

## 2024-06-06 DIAGNOSIS — I5022 Chronic systolic (congestive) heart failure: Secondary | ICD-10-CM | POA: Diagnosis not present

## 2024-06-06 DIAGNOSIS — K5981 Ogilvie syndrome: Secondary | ICD-10-CM | POA: Diagnosis not present

## 2024-06-06 DIAGNOSIS — E876 Hypokalemia: Secondary | ICD-10-CM | POA: Diagnosis not present

## 2024-06-06 DIAGNOSIS — R739 Hyperglycemia, unspecified: Secondary | ICD-10-CM | POA: Diagnosis not present

## 2024-06-06 DIAGNOSIS — I2510 Atherosclerotic heart disease of native coronary artery without angina pectoris: Secondary | ICD-10-CM | POA: Diagnosis not present

## 2024-06-06 DIAGNOSIS — R6521 Severe sepsis with septic shock: Secondary | ICD-10-CM

## 2024-06-06 DIAGNOSIS — R578 Other shock: Secondary | ICD-10-CM

## 2024-06-06 DIAGNOSIS — R569 Unspecified convulsions: Secondary | ICD-10-CM

## 2024-06-06 LAB — ECHOCARDIOGRAM COMPLETE
Height: 72 in
S' Lateral: 5.7 cm
Weight: 2564.39 [oz_av]

## 2024-06-06 LAB — BASIC METABOLIC PANEL WITH GFR
Anion gap: 19 — ABNORMAL HIGH (ref 5–15)
Anion gap: 18 — ABNORMAL HIGH (ref 5–15)
Anion gap: 16 — ABNORMAL HIGH (ref 5–15)
Anion gap: 15 (ref 5–15)
Anion gap: 15 (ref 5–15)
BUN: 76 mg/dL — ABNORMAL HIGH (ref 8–23)
BUN: 84 mg/dL — ABNORMAL HIGH (ref 8–23)
BUN: 87 mg/dL — ABNORMAL HIGH (ref 8–23)
BUN: 87 mg/dL — ABNORMAL HIGH (ref 8–23)
BUN: 85 mg/dL — ABNORMAL HIGH (ref 8–23)
CO2: 14 mmol/L — ABNORMAL LOW (ref 22–32)
CO2: 14 mmol/L — ABNORMAL LOW (ref 22–32)
CO2: 15 mmol/L — ABNORMAL LOW (ref 22–32)
CO2: 15 mmol/L — ABNORMAL LOW (ref 22–32)
CO2: 18 mmol/L — ABNORMAL LOW (ref 22–32)
Calcium: 6.8 mg/dL — ABNORMAL LOW (ref 8.9–10.3)
Calcium: 6.7 mg/dL — ABNORMAL LOW (ref 8.9–10.3)
Calcium: 6.7 mg/dL — ABNORMAL LOW (ref 8.9–10.3)
Calcium: 6.7 mg/dL — ABNORMAL LOW (ref 8.9–10.3)
Calcium: 6.6 mg/dL — ABNORMAL LOW (ref 8.9–10.3)
Chloride: 107 mmol/L (ref 98–111)
Chloride: 107 mmol/L (ref 98–111)
Chloride: 108 mmol/L (ref 98–111)
Chloride: 111 mmol/L (ref 98–111)
Chloride: 112 mmol/L — ABNORMAL HIGH (ref 98–111)
Creatinine, Ser: 1.63 mg/dL — ABNORMAL HIGH (ref 0.61–1.24)
Creatinine, Ser: 1.65 mg/dL — ABNORMAL HIGH (ref 0.61–1.24)
Creatinine, Ser: 1.58 mg/dL — ABNORMAL HIGH (ref 0.61–1.24)
Creatinine, Ser: 1.56 mg/dL — ABNORMAL HIGH (ref 0.61–1.24)
Creatinine, Ser: 1.52 mg/dL — ABNORMAL HIGH (ref 0.61–1.24)
GFR, Estimated: 43 mL/min — ABNORMAL LOW
GFR, Estimated: 43 mL/min — ABNORMAL LOW
GFR, Estimated: 45 mL/min — ABNORMAL LOW
GFR, Estimated: 46 mL/min — ABNORMAL LOW
GFR, Estimated: 47 mL/min — ABNORMAL LOW
Glucose, Bld: 160 mg/dL — ABNORMAL HIGH (ref 70–99)
Glucose, Bld: 117 mg/dL — ABNORMAL HIGH (ref 70–99)
Glucose, Bld: 125 mg/dL — ABNORMAL HIGH (ref 70–99)
Glucose, Bld: 140 mg/dL — ABNORMAL HIGH (ref 70–99)
Glucose, Bld: 183 mg/dL — ABNORMAL HIGH (ref 70–99)
Potassium: 2.7 mmol/L — CL (ref 3.5–5.1)
Potassium: 2.6 mmol/L — CL (ref 3.5–5.1)
Potassium: 2.5 mmol/L — CL (ref 3.5–5.1)
Potassium: 3.1 mmol/L — ABNORMAL LOW (ref 3.5–5.1)
Potassium: 2.7 mmol/L — CL (ref 3.5–5.1)
Sodium: 140 mmol/L (ref 135–145)
Sodium: 139 mmol/L (ref 135–145)
Sodium: 140 mmol/L (ref 135–145)
Sodium: 141 mmol/L (ref 135–145)
Sodium: 144 mmol/L (ref 135–145)

## 2024-06-06 LAB — POCT I-STAT 7, (LYTES, BLD GAS, ICA,H+H)
Acid-base deficit: 10 mmol/L — ABNORMAL HIGH (ref 0.0–2.0)
Acid-base deficit: 8 mmol/L — ABNORMAL HIGH (ref 0.0–2.0)
Bicarbonate: 14.2 mmol/L — ABNORMAL LOW (ref 20.0–28.0)
Bicarbonate: 15.4 mmol/L — ABNORMAL LOW (ref 20.0–28.0)
Calcium, Ion: 1 mmol/L — ABNORMAL LOW (ref 1.15–1.40)
Calcium, Ion: 1 mmol/L — ABNORMAL LOW (ref 1.15–1.40)
HCT: 26 % — ABNORMAL LOW (ref 39.0–52.0)
HCT: 23 % — ABNORMAL LOW (ref 39.0–52.0)
Hemoglobin: 8.8 g/dL — ABNORMAL LOW (ref 13.0–17.0)
Hemoglobin: 7.8 g/dL — ABNORMAL LOW (ref 13.0–17.0)
O2 Saturation: 96 %
O2 Saturation: 100 %
Patient temperature: 37.6
Patient temperature: 37.6
Potassium: 2.4 mmol/L — CL (ref 3.5–5.1)
Potassium: 2.5 mmol/L — CL (ref 3.5–5.1)
Sodium: 141 mmol/L (ref 135–145)
Sodium: 145 mmol/L (ref 135–145)
TCO2: 15 mmol/L — ABNORMAL LOW (ref 22–32)
TCO2: 16 mmol/L — ABNORMAL LOW (ref 22–32)
pCO2 arterial: 24.4 mmHg — ABNORMAL LOW (ref 32–48)
pCO2 arterial: 23 mmHg — ABNORMAL LOW (ref 32–48)
pH, Arterial: 7.374 (ref 7.35–7.45)
pH, Arterial: 7.435 (ref 7.35–7.45)
pO2, Arterial: 85 mmHg (ref 83–108)
pO2, Arterial: 202 mmHg — ABNORMAL HIGH (ref 83–108)

## 2024-06-06 LAB — COOXEMETRY PANEL
Carboxyhemoglobin: 0.8 % (ref 0.5–1.5)
Methemoglobin: <0.7 % (ref 0.0–1.5)
O2 Saturation: 71.5 %
Total hemoglobin: 8.8 g/dL — ABNORMAL LOW (ref 12.0–16.0)

## 2024-06-06 LAB — CBC
HCT: 27.8 % — ABNORMAL LOW (ref 39.0–52.0)
HCT: 25.6 % — ABNORMAL LOW (ref 39.0–52.0)
HCT: 24.7 % — ABNORMAL LOW (ref 39.0–52.0)
Hemoglobin: 9.4 g/dL — ABNORMAL LOW (ref 13.0–17.0)
Hemoglobin: 8.9 g/dL — ABNORMAL LOW (ref 13.0–17.0)
Hemoglobin: 8.6 g/dL — ABNORMAL LOW (ref 13.0–17.0)
MCH: 30 pg (ref 26.0–34.0)
MCH: 30.2 pg (ref 26.0–34.0)
MCH: 29.9 pg (ref 26.0–34.0)
MCHC: 33.8 g/dL (ref 30.0–36.0)
MCHC: 34.8 g/dL (ref 30.0–36.0)
MCHC: 34.8 g/dL (ref 30.0–36.0)
MCV: 88.8 fL (ref 80.0–100.0)
MCV: 86.8 fL (ref 80.0–100.0)
MCV: 85.8 fL (ref 80.0–100.0)
Platelets: 433 K/uL — ABNORMAL HIGH (ref 150–400)
Platelets: 443 K/uL — ABNORMAL HIGH (ref 150–400)
Platelets: 424 K/uL — ABNORMAL HIGH (ref 150–400)
RBC: 3.13 MIL/uL — ABNORMAL LOW (ref 4.22–5.81)
RBC: 2.95 MIL/uL — ABNORMAL LOW (ref 4.22–5.81)
RBC: 2.88 MIL/uL — ABNORMAL LOW (ref 4.22–5.81)
RDW: 17.5 % — ABNORMAL HIGH (ref 11.5–15.5)
RDW: 17.4 % — ABNORMAL HIGH (ref 11.5–15.5)
RDW: 17.4 % — ABNORMAL HIGH (ref 11.5–15.5)
WBC: 9.8 K/uL (ref 4.0–10.5)
WBC: 10.1 K/uL (ref 4.0–10.5)
WBC: 9.8 K/uL (ref 4.0–10.5)
nRBC: 0 % (ref 0.0–0.2)
nRBC: 0 % (ref 0.0–0.2)
nRBC: 0 % (ref 0.0–0.2)

## 2024-06-06 LAB — POCT I-STAT, CHEM 8
BUN: 75 mg/dL — ABNORMAL HIGH (ref 8–23)
Calcium, Ion: 1.03 mmol/L — ABNORMAL LOW (ref 1.15–1.40)
Chloride: 108 mmol/L (ref 98–111)
Creatinine, Ser: 1.8 mg/dL — ABNORMAL HIGH (ref 0.61–1.24)
Glucose, Bld: 115 mg/dL — ABNORMAL HIGH (ref 70–99)
HCT: 27 % — ABNORMAL LOW (ref 39.0–52.0)
Hemoglobin: 9.2 g/dL — ABNORMAL LOW (ref 13.0–17.0)
Potassium: 2.2 mmol/L — CL (ref 3.5–5.1)
Sodium: 141 mmol/L (ref 135–145)
TCO2: 15 mmol/L — ABNORMAL LOW (ref 22–32)

## 2024-06-06 LAB — CG4 I-STAT (LACTIC ACID)
Lactic Acid, Venous: 2 mmol/L (ref 0.5–1.9)
Lactic Acid, Venous: 1.3 mmol/L (ref 0.5–1.9)

## 2024-06-06 LAB — GLUCOSE, CAPILLARY
Glucose-Capillary: 134 mg/dL — ABNORMAL HIGH (ref 70–99)
Glucose-Capillary: 110 mg/dL — ABNORMAL HIGH (ref 70–99)
Glucose-Capillary: 122 mg/dL — ABNORMAL HIGH (ref 70–99)
Glucose-Capillary: 113 mg/dL — ABNORMAL HIGH (ref 70–99)
Glucose-Capillary: 159 mg/dL — ABNORMAL HIGH (ref 70–99)

## 2024-06-06 LAB — MAGNESIUM
Magnesium: 1.9 mg/dL (ref 1.7–2.4)
Magnesium: 1.8 mg/dL (ref 1.7–2.4)
Magnesium: 1.8 mg/dL (ref 1.7–2.4)
Magnesium: 1.7 mg/dL (ref 1.7–2.4)

## 2024-06-06 LAB — CBG MONITORING, ED: Glucose-Capillary: 168 mg/dL — ABNORMAL HIGH (ref 70–99)

## 2024-06-06 LAB — MRSA NEXT GEN BY PCR, NASAL: MRSA by PCR Next Gen: NOT DETECTED

## 2024-06-06 LAB — POC OCCULT BLOOD, ED: Fecal Occult Bld: POSITIVE — AB

## 2024-06-06 MED ORDER — MAGNESIUM SULFATE 4 GM/100ML IV SOLN
4.0000 g | Freq: Once | INTRAVENOUS | Status: DC
  Administered 2024-06-06: 4 g via INTRAVENOUS
  Filled 2024-06-06: qty 100

## 2024-06-06 MED ORDER — PYRIDOSTIGMINE BROMIDE 10 MG/2ML IV SOLN
2.0000 mg | Freq: Three times a day (TID) | INTRAVENOUS | Status: DC
  Filled 2024-06-06 (×2): qty 0.4

## 2024-06-06 MED ORDER — ORAL CARE MOUTH RINSE: 15.0000 mL | OROMUCOSAL | Status: DC

## 2024-06-06 MED ORDER — POTASSIUM CHLORIDE 10 MEQ/50ML IV SOLN
10.0000 meq | INTRAVENOUS | Status: AC
  Administered 2024-06-06 (×6): 10 meq via INTRAVENOUS
  Filled 2024-06-06 (×6): qty 50

## 2024-06-06 MED ORDER — HYDROMORPHONE BOLUS VIA INFUSION
1.0000 mg | INTRAVENOUS | Status: DC | PRN
  Administered 2024-06-06 – 2024-06-07 (×4): 1 mg via INTRAVENOUS

## 2024-06-06 MED ORDER — GLYCOPYRROLATE 1 MG PO TABS: 1.0000 mg | ORAL_TABLET | ORAL | Status: DC | PRN

## 2024-06-06 MED ORDER — HYDROMORPHONE HCL-NACL 50-0.9 MG/50ML-% IV SOLN
0.0000 mg/h | INTRAVENOUS | Status: DC
  Administered 2024-06-06: 1 mg/h via INTRAVENOUS
  Filled 2024-06-06 (×2): qty 50

## 2024-06-06 MED ORDER — LEVETIRACETAM (KEPPRA) 500 MG/5 ML ADULT IV PUSH: 500.0000 mg | Freq: Two times a day (BID) | INTRAVENOUS | Status: DC

## 2024-06-06 MED ORDER — CALCIUM GLUCONATE-NACL 2-0.675 GM/100ML-% IV SOLN
2.0000 g | Freq: Once | INTRAVENOUS | Status: AC
  Administered 2024-06-06: 2000 mg via INTRAVENOUS
  Filled 2024-06-06: qty 100

## 2024-06-06 MED ORDER — GERHARDT'S BUTT CREAM: TOPICAL_CREAM | CUTANEOUS | Status: DC | PRN

## 2024-06-06 MED ORDER — SODIUM CHLORIDE 0.9 % IV SOLN
0.7500 ug/kg/min | INTRAVENOUS | Status: DC
  Administered 2024-06-06: 0.75 ug/kg/min via INTRAVENOUS
  Filled 2024-06-06: qty 50

## 2024-06-06 MED ORDER — SODIUM BICARBONATE 8.4 % IV SOLN
INTRAVENOUS | Status: AC
  Administered 2024-06-06: 100 meq via INTRAVENOUS
  Filled 2024-06-06: qty 100

## 2024-06-06 MED ORDER — POLYVINYL ALCOHOL 1.4 % OP SOLN: 1.0000 [drp] | Freq: Four times a day (QID) | OPHTHALMIC | Status: DC | PRN

## 2024-06-06 MED ORDER — PERFLUTREN LIPID MICROSPHERE
1.0000 mL | INTRAVENOUS | Status: AC | PRN
  Administered 2024-06-06: 3 mL via INTRAVENOUS

## 2024-06-06 MED ORDER — GLYCOPYRROLATE 0.2 MG/ML IJ SOLN
0.2000 mg | INTRAMUSCULAR | Status: DC | PRN
  Filled 2024-06-06: qty 1

## 2024-06-06 MED ORDER — SODIUM BICARBONATE 8.4 % IV SOLN: 100.0000 meq | Freq: Once | INTRAVENOUS | Status: AC

## 2024-06-06 MED ORDER — NOREPINEPHRINE 4 MG/250ML-% IV SOLN
INTRAVENOUS | Status: AC
  Filled 2024-06-06: qty 250

## 2024-06-06 MED ORDER — ACETAMINOPHEN 650 MG RE SUPP: 650.0000 mg | Freq: Four times a day (QID) | RECTAL | Status: DC | PRN

## 2024-06-06 MED ORDER — HYDROCORTISONE SOD SUC (PF) 100 MG IJ SOLR
100.0000 mg | Freq: Two times a day (BID) | INTRAMUSCULAR | Status: DC
  Administered 2024-06-06: 100 mg via INTRAVENOUS
  Filled 2024-06-06: qty 2

## 2024-06-06 MED ORDER — LEVETIRACETAM (KEPPRA) 500 MG/5 ML ADULT IV PUSH: 60.0000 mg/kg | Freq: Once | INTRAVENOUS | Status: DC

## 2024-06-06 MED ORDER — POTASSIUM CHLORIDE 10 MEQ/50ML IV SOLN
10.0000 meq | INTRAVENOUS | Status: DC
  Administered 2024-06-06: 10 meq via INTRAVENOUS
  Filled 2024-06-06 (×2): qty 50

## 2024-06-06 MED ORDER — SODIUM CHLORIDE 0.9 % IV SOLN: INTRAVENOUS | Status: DC

## 2024-06-06 MED ORDER — GLYCOPYRROLATE 0.2 MG/ML IJ SOLN
0.2000 mg | INTRAMUSCULAR | Status: DC | PRN
  Administered 2024-06-06: 0.2 mg via INTRAVENOUS

## 2024-06-06 MED ORDER — ACETAMINOPHEN 325 MG PO TABS: 650.0000 mg | ORAL_TABLET | Freq: Four times a day (QID) | ORAL | Status: DC | PRN

## 2024-06-06 MED ORDER — ORAL CARE MOUTH RINSE: 15.0000 mL | OROMUCOSAL | Status: DC | PRN

## 2024-06-06 MED ORDER — MIDAZOLAM HCL (PF) 2 MG/2ML IJ SOLN
2.0000 mg | INTRAMUSCULAR | Status: DC | PRN
  Administered 2024-06-06: 2 mg via INTRAVENOUS
  Filled 2024-06-06: qty 4

## 2024-06-06 MED ORDER — NOREPINEPHRINE 16 MG/250ML-% IV SOLN
0.0000 ug/min | INTRAVENOUS | Status: DC
  Administered 2024-06-06: 33 ug/min via INTRAVENOUS
  Administered 2024-06-06: 30 ug/min via INTRAVENOUS
  Filled 2024-06-06 (×3): qty 250

## 2024-06-06 NOTE — Progress Notes (Addendum)
 CCM Night Shift Progress Note  Briefly, Mr. Stan is a 79 year old male with history of CAD, HFrEF, DM2 and OSA who is admitted with Ogilvie's syndrome and septic shock.   Vitals:   06/06/24 2015 06/06/24 2030  BP:    Pulse: 89 88  Resp: (!) 26 (!) 25  Temp: 99.9 F (37.7 C) 99.9 F (37.7 C)  SpO2: 100% 100%   Physical Exam: General: acute on chronically ill appearing male, lying in bed, increased work of breathing, minimally responsive  HENT: sclera anicteric, [place]/AT Neuro: does not open eyes to stimuli, PERRL, withdraws to painful stimuli x 4  Pulm: increased work of breathing on NRB, coarse bilaterally  CV: tachycardic, normal S1 and S2 GI: soft, distended  GU: deferred  Extremities: cool, dry, 1+ edema   I was called by nursing at 2159 that patient was having a tonic-clonic seizure. Described as full body extensor posturing and shaking that lasted less than 30 seconds. It had resolved by the time I arrived at bedside. Patient had desated to the 80's and was requiring BVM. Switched to NRB and now SpO2 100%. Coarse breath sounds bilaterally and increased work of breathing. Obtain CXR and ABG. Patient's mental status was poor prior to seizure activity - would only withdraw to painful stimuli. Unable to obtain [facility] given tenuous respiratory status and DNI. Will continue cangrelor  given recent stent. Will start Keppra . Patient also with escalating levophed  requirements now on 40 mcg/min. Will give 100 mEQ sodium bicarb. Niece at bedside called remaining family who plan to come to hospital.   Discussed with Dr. Gretta who agrees with above assessment and plan.   Signature The patient is critically ill with multiple organ system failure and requires high complexity decision making for assessment and support, frequent evaluation and titration of therapies, advanced monitoring, review of radiographic studies and interpretation of complex data.   Critical Care Time devoted to patient care  services, exclusive of separately billable procedures, described in this note is 35 minutes.  Rexene LOISE Blush, PA-C 06/06/2024 10:38 PM  Pulmonary & Critical Care  For contact information, see Amion.  After hours, 7PM- 7AM, please call on call APP for 2H.

## 2024-06-06 NOTE — IPAL (Signed)
" °  Interdisciplinary Goals of Care Family Meeting   Date carried out: 06/06/2024  Location of the meeting: Bedside  Member's involved: Physician, Bedside Registered Nurse, and Family Member or next of kin  Durable Power of Attorney or acting medical decision maker: Dennise Bamber, spouse  Discussion: We discussed goals of care for Chase Combs. Patient had a seizure and since then has been requiring maximal oxygen and IV blood pressure medicine support. It is unclear what caused the seizure it could be electrolyte abnormalities or possible stroke especially since he is on anti-platelet for his heart stent but given his respiratory status and wishes against intubating it is unsafe to take him to CT scan at this time. The risks of stopping the anti-platelet are too high given his recent stent. He is likely also aspirating given his altered mental status.   Family tells me his body has been through so much and that he has been in and out of the hospital since Thanksgiving and confirm his DNR/DNI wishes. I explain at this point we could focus on keeping him comfortable and avoiding interventions that could prolong suffering or continue full scope of care with the knowledge of his poor prognosis. Family has agreed on comfort measures at this time. Code status changed to reflect their wishes.   Code status:   Code Status: Limited: Do not attempt resuscitation (DNR) -DNR-LIMITED -Do Not Intubate/DNI    Disposition: In-patient comfort care  Time spent for the meeting: 8 minutes    Rexene LOISE Blush, PA-C  06/06/2024, 11:06 PM   "

## 2024-06-06 NOTE — Consult Note (Signed)
 WOC Nurse Consult Note: Consult requested for sacrum and right achilles.  Performed remotely after review of progress notes and photos in the EMR. Pt is critically ill with multiple systemic factors which can impair healing.  Right achilles area with dark red-purple deep tissue pressure (DTPI) injury, 1X1cm and present on admission, according to the bedside nurses' wound care flow sheet.    Sacrum with dark purple Deep tissue pressure injury, 3X2cm and present on admission, according to the bedside nurses' wound care flow sheet. Generalized erythemia to bilat buttocks and sacrum surrounding the location.     DTPI are high risk to evolve into full thickness tissue loss within 7-10 days. Pt is on a low airloss mattress to reduce pressure.  Dressing procedure/placement/frequency: Topical treatment orders provided for bedside nurses to perform as follows: Apply Xeroform gauze to sacrum and right achilles area Q day, then cover with foam dressing.  Chnge foam dressing Q 3 days or PRN soiling.  Please re-consult if further assistance is needed.  Thank-you,  Stephane Fought MSN, RN, CWOCN, CWCN-AP, CNS Contact Mon-Fri 0700-1500: [phone]

## 2024-06-06 NOTE — Procedures (Signed)
 Arterial Catheter Insertion Procedure Note  Chase Combs  [id number]  December 08, 1945  Date:06/06/2024  Time:6:48 AM    Provider Performing: Harlene Na    Procedure: Insertion of Arterial Line (63379) with US  guidance (23062)   Indication(s) Blood pressure monitoring and/or need for frequent ABGs  Consent Risks of the procedure as well as the alternatives and risks of each were explained to the patient and/or caregiver.  Consent for the procedure was obtained and is signed in the bedside chart  Anesthesia None   Time Out Verified patient identification, verified procedure, site/side was marked, verified correct patient position, special equipment/implants available, medications/allergies/relevant history reviewed, required imaging and test results available.   Sterile Technique Maximal sterile technique including full sterile barrier drape, hand hygiene, sterile gown, sterile gloves, mask, hair covering, sterile ultrasound probe cover (if used).   Procedure Description Area of catheter insertion was cleaned with chlorhexidine  and draped in sterile fashion. With real-time ultrasound guidance an arterial catheter was placed into the left radial artery.  Appropriate arterial tracings confirmed on monitor.     Complications/Tolerance None; patient tolerated the procedure well.   EBL Minimal   Specimen(s) None

## 2024-06-06 NOTE — Procedures (Signed)
 Central Venous Catheter Insertion Procedure Note  Chase Combs  [id number]  1945/08/14  Date:06/06/2024  Time:6:45 AM   Provider Performing:Britanie Harshman Layman   Procedure: Insertion of Non-tunneled Central Venous [phone]) with US  guidance (23062)   Indication(s) Medication administration  Consent Risks of the procedure as well as the alternatives and risks of each were explained to the patient and/or caregiver.  Consent for the procedure was obtained and is signed in the bedside chart  Anesthesia Topical only with 1% lidocaine    Timeout Verified patient identification, verified procedure, site/side was marked, verified correct patient position, special equipment/implants available, medications/allergies/relevant history reviewed, required imaging and test results available.  Sterile Technique Maximal sterile technique including full sterile barrier drape, hand hygiene, sterile gown, sterile gloves, mask, hair covering, sterile ultrasound probe cover (if used).  Procedure Description Area of catheter insertion was cleaned with chlorhexidine  and draped in sterile fashion.  With real-time ultrasound guidance a central venous catheter was placed into the left internal jugular vein. Wire was verified in vein in cross section and longitudinal view under u/s prior to dilation Nonpulsatile blood flow and easy flushing noted in all ports.  The catheter was sutured in place and sterile dressing applied.   Complications/Tolerance None; patient tolerated the procedure well. Chest X-ray is ordered to verify placement for internal jugular or subclavian cannulation.   Chest x-ray is not ordered for femoral cannulation.  EBL Minimal  Specimen(s) None,

## 2024-06-06 NOTE — Progress Notes (Signed)
 Echocardiogram 2D Echocardiogram has been performed.  Chase Combs 06/06/2024, 8:50 AM

## 2024-06-06 NOTE — Consult Note (Addendum)
 "      Consultation  Referring Provider:  [facility]  Primary Care Physician:  Wonda Worth SQUIBB, PA Primary Gastroenterologist:  Dr. Stacia  (hospital only)     Reason for Consultation:  recurrent ileus/Ogilvie's      LOS: 1 day          HPI:   Chase Combs is a 79 y.o. 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 With sleep apnea, and history of prostate cancer presents for recurrent ileus/ogilvie's  Spring 2025: underwent knee replacement complicated by postop ileus. Flex sig at that time showed diverticulosis in left colon and congested mucosa in entire examined colon, no overt colitis.  01/2024: patient had STEMI and stenting x2 mid August 2025 and hospital course was complicated by sepsis, intestinal pneumatosis with concerns for ischemic colitis with associated ileus. Developed GI bleeding underwent EGD on 02/02/2024 that showed 1 superficial esophageal ulcer, grade C esophagitis and a few erosions in the duodenum. Sigmoidoscopy at that same setting with red blood noted in the rectum sigmoid colon and descending colon to the transverse colon no visible source, few medium sized diverticuli noted in the left colon and a nonbleeding superficial ulcer in the rectum.  Failed relistor  and neostigmine  and underwent colonoscopy with Dr. Albertus 02/18/2024 we did a colonoscopy for decompression and a decompression tube was placed.  He had resolution of the ileus.   04/26/2024: seen again for progressive ileus/Oglivie's. we had concerns about retrial of neostigmine  with his extensive cardiac history and variable heart rate,  We tried to decompress his colon with enemas, rectal tube placement for which he did have some response.  He was also given MiraLAX  3 times daily.  In the earlier he seemed to have  lost response to Relistor  it appears from her notes that we did retry Relistor ?  We signed off on 04/30/2024.  Patient was discharged 05/11/2024.   05/20/24: readmitted for chronic colonic pseudoobstruction with significant improvement on mestinon  30mg  BID that was started 12/31 and patient was discharged 1/08  He presents 06/05/24 with recurrent symptoms of nausea, vomiting, syncope, and hypotension and admitted to PCCM with high pressor requirement and abdominal xray showing persistent marked gaseous distention of the colon.  Workup notable for: K 2.5 Magnesium  1.8 CO2 15 BUN 87, Cr. 1.58, GFR 45 WBC 9.8 Platelets 424 Positive fecal occult Lactic acid 3.5 Pro Brain Natriuretic Peptide  9,962  Past Medical History:  Diagnosis Date   Abdominal hernia    per pt   Arthritis    hands   Benign localized prostatic hyperplasia with lower urinary tract symptoms (LUTS)    CAD (coronary artery disease) 05/2004   cardiologist--- dr anner;  positive stress test , had outpt cardiac cath 05-27-2004 stenosis RI;  06-18-2004 cath w/ PCI and BMS x1 to pRI;   STEMI 02-16-2009  s/p cath w/ PCI thrombectomy for total occluded RCA , BMS x1 to pRCA;  nuclear stress test 03-15-2012 low risk no ischemia but sig inferior-inferolateral infarct , ef 51%   DM II (diabetes mellitus, type II), controlled (HCC)    Dyslipidemia    Essential hypertension    History of ST elevation myocardial infarction (STEMI) 02/16/2009   inferior STEMI   cath w/ pci w/ BMS to pRCA   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  urologist--- dr borden/ radiation oncology-- dr patrcia;  dx 12/ 2022,  Gleason 4+5, PSA 24.6, volume 94.6cc;  plan to start IMRT   OSA (obstructive sleep apnea)    per pt dx approx 2008, no cpap intolerant   PONV (postoperative nausea and vomiting)    S/p bare metal coronary artery stent    01/ 2006  x1 BMS to pRI (CoStar study stent);  and 09/ 2010  x1 BMS to pRCA   Status post total knee  replacement, 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    Surgical History:  He  has a past surgical history that includes Coronary angioplasty with stent (06/18/2004); Coronary angioplasty with stent (02/16/2009); Cardiac catheterization (05/27/2004); Inguinal hernia repair (Right, 09/02/1999); Inguinal hernia repair (Left); Lumbar disc surgery (1982); Knee arthroscopy w/ meniscal repair (Right, 12/18/2005); Gold seed implant (N/A, 09/19/2021); SPACE OAR INSTILLATION (N/A, 09/19/2021); Total knee arthroplasty (Left, 10/01/2023); Flexible sigmoidoscopy (N/A, 10/20/2023); RIGHT/LEFT HEART CATH AND CORONARY ANGIOGRAPHY (N/A, 01/17/2024); CORONARY PRESSURE/FFR STUDY (N/A, 01/17/2024); Coronary Ultrasound/IVUS (N/A, 01/17/2024); CORONARY STENT INTERVENTION (N/A, 01/17/2024); Coronary/Graft Acute MI Revascularization (N/A, 02/01/2024); Esophagogastroduodenoscopy (N/A, 02/02/2024); Flexible sigmoidoscopy (N/A, 02/02/2024); Colonoscopy (N/A, 02/18/2024); bowel decompression (N/A, 02/18/2024); CARDIOVERSION (N/A, 02/21/2024); and TRANSESOPHAGEAL ECHOCARDIOGRAM (N/A, 02/21/2024). Family History:  His family history includes Lung cancer in his mother. Social History:   reports that he has never smoked. He has never used smokeless tobacco. He reports that he does not drink alcohol  and does not use drugs.  Prior to Admission medications  Medication Sig Start Date End Date Taking? Authorizing Provider  acetaminophen  (TYLENOL ) 500 MG tablet Take 500 mg by mouth every 6 (six) hours as needed for mild pain (pain score 1-3) or headache.   Yes [provider]  amiodarone  (PACERONE ) 200 MG tablet Take 1 tablet (200 mg total) by mouth daily. 04/07/24  Yes Milford, Harlene HERO, FNP  apixaban  (ELIQUIS ) 5 MG TABS tablet Take 1 tablet (5 mg total) by mouth 2 (two) times daily. 03/23/24  Yes Colletta Manuelita Garre, PA-C  barrier cream (NON-SPECIFIED) CREA Apply 1 Application topically in the morning, at noon, and at bedtime. Apply to buttocks every shift for  skin protection   Yes [provider]  Calcium  Oyster Shell 500 MG TABS Take 500 mg by mouth in the morning.   Yes [provider]  clopidogrel  (PLAVIX ) 75 MG tablet Take 1 tablet (75 mg total) by mouth daily. 03/23/24  Yes Colletta Manuelita Garre, PA-C  cyanocobalamin  (VITAMIN B12) 1000 MCG tablet Take 1,000 mcg by mouth daily.   Yes [provider]  hydrOXYzine  (ATARAX ) 25 MG tablet Take 25 mg by mouth at bedtime as needed for anxiety. 03/27/24  Yes [provider]  Multiple Vitamins-Minerals (MULTIVITAMIN MEN 50+) TABS Take 1 tablet by mouth daily.   Yes [provider]  nitroGLYCERIN  (NITROSTAT ) 0.4 MG SL tablet Place 0.4 mg under the tongue every 5 (five) minutes as needed for chest pain. Give one tablet (0.4mg ) sublingually every 5 minutes as needed for chest pain for up to 3 doses. 01/19/24  Yes [provider]  ondansetron  (ZOFRAN ) 8 MG tablet Take 8 mg by mouth every 8 (eight) hours as needed for nausea or vomiting. 02/29/24  Yes [provider]  pantoprazole  (PROTONIX ) 40 MG tablet Take 1 tablet (40 mg total) by mouth daily. 02/24/24  Yes Arrien, Mauricio Daniel, MD  polyethylene glycol powder (GLYCOLAX /MIRALAX ) 17 GM/SCOOP powder Dissolve 1 capful (17g) in 4-8 ounces of liquid and take by mouth 2 (two) times daily. Patient taking differently: Take  17 g by mouth daily. 06/01/24  Yes Hongalgi, Anand D, MD  Probiotic Product (PROBIOTIC GUMMIES PO) Take 1,000 mg by mouth at bedtime.   Yes [provider]  pyridostigmine  (MESTINON ) 60 MG tablet Take 0.5 tablets (30 mg total) by mouth 2 (two) times daily. 06/01/24  Yes Hongalgi, Anand D, MD  rosuvastatin  (CRESTOR ) 20 MG tablet TAKE 1 TABLET(20 MG) BY MOUTH DAILY 11/29/23  Yes Anner Alm ORN, MD  saccharomyces boulardii (FLORASTOR) 250 MG capsule Take 500 mg by mouth 2 (two) times daily.   Yes [provider]  sertraline  (ZOLOFT ) 50 MG tablet Take 50 mg by mouth at bedtime.    Yes [provider]  tamsulosin  (FLOMAX ) 0.4 MG CAPS capsule Take 1 capsule (0.4 mg total) by mouth daily after supper. Patient taking differently: Take 0.4 mg by mouth at bedtime. 11/17/21  Yes Patrcia Cough, MD    Current Facility-Administered Medications  Medication Dose Route Frequency Provider Last Rate Last Admin   0.9 %  sodium chloride  infusion  250 mL Intravenous Continuous Layman Raisin, DO   Stopped at 06/06/24 0317   ceFEPIme  (MAXIPIME ) 2 g in sodium chloride  0.9 % 100 mL IVPB  2 g Intravenous Q12H Gaines Carrier, RPH 200 mL/hr at 06/06/24 0654 2 g at 06/06/24 0654   Chlorhexidine  Gluconate Cloth 2 % PADS 6 each  6 each Topical Daily Layman Raisin, DO   6 each at 06/06/24 1025   Gerhardt's butt cream   Topical PRN Gretta Doffing P, DO       hydrocortisone  sodium succinate  (SOLU-CORTEF ) 100 MG injection 100 mg  100 mg Intravenous Q12H Babcock, Peter E, NP   100 mg at 06/06/24 1238   insulin  aspart (novoLOG ) injection 0-6 Units  0-6 Units Subcutaneous Q4H Layman Raisin, DO   1 Units at 06/06/24 0059   lactated ringers  1,000 mL with potassium chloride  20 mEq infusion   Intravenous Continuous Layman Raisin, DO 40 mL/hr at 06/06/24 0500 Infusion Verify at 06/06/24 0500   metroNIDAZOLE  (FLAGYL ) IVPB 500 mg  500 mg Intravenous Q12H Layman Raisin, DO 100 mL/hr at 06/06/24 1014 500 mg at 06/06/24 1014   norepinephrine  (LEVOPHED ) 16 mg in (0.064 mg/mL) premix infusion  0-40 mcg/min Intravenous Continuous Jenna Maude BRAVO, NP 28.1 mL/hr at 06/06/24 0741 30 mcg/min at 06/06/24 0741   norepinephrine  (LEVOPHED ) 4-5 MG/250ML-% infusion SOLN            ondansetron  (ZOFRAN ) injection 4 mg  4 mg Intravenous Q6H PRN Marshall, Jessica, DO       polyethylene glycol (MIRALAX  / GLYCOLAX ) packet 17 g  17 g Oral Daily PRN Layman Raisin, DO       potassium chloride  10 mEq in 50 mL *CENTRAL LINE* IVPB  10 mEq Intravenous Q1 Hr x 6 Gretel Prentice BIRCH, RPH 50 mL/hr at 06/06/24  1342 10 mEq at 06/06/24 1342   senna (SENOKOT) tablet 8.6 mg  1 tablet Oral BID PRN Marshall, Jessica, DO       vancomycin  (VANCOCIN ) IVPB 1000 mg/200 mL premix  1,000 mg Intravenous Q24H Oriet, Jonathan, RPH       vasopressin  (PITRESSIN) 20 Units in 100 mL (0.2 unit/mL) infusion-*FOR SHOCK*  0-0.04 Units/min Intravenous Titrated Layman Raisin, DO 12 mL/hr at 06/06/24 1421 0.04 Units/min at 06/06/24 1421    Allergies as of 06/05/2024 - Review Complete 06/05/2024  Allergen Reaction Noted   Codeine Nausea And Vomiting 03/03/2013   Latex Rash 10/01/2023    Review of Systems  Unable to perform ROS: Critical illness       Physical Exam:  Vital signs in last 24 hours: Temp:  [95.2 F (35.1 C)-100 F (37.8 C)] 99.9 F (37.7 C) (01/13 1145) Pulse Rate:  [42-96] 82 (01/13 1145) Resp:  [12-28] 23 (01/13 1145) BP: (76-162)/(38-139) 105/49 (01/13 1145) SpO2:  [93 %-100 %] 96 % (01/13 1145) Arterial Line BP: (92-156)/(31-54) 127/47 (01/13 1145) Weight:  [71.2 kg-72.7 kg] 72.7 kg (01/13 0445)   Last BM recorded by nurses in past 5 days Stool Type: Type 7 (Liquid consistency with no solid pieces) (06/06/2024 12:20 PM)  General: chronically ill appearing male, unresponsive, NG tube with 200cc yellow fluid, on high dose pressors HEENT: normocephalic atraumatic pupils equal reactive mucous membranes dry Pulmonary: mild increased respiratory effort Cardiac: Regular rate and rhythm Abdomen soft, hypoactive, distended,NG tube with 200cc yellow fluid Extremities: scattered areas of ecchymosis and bilateral LE edema Neuro: minimally responsive.  Withdraws to pain only.   LAB RESULTS: Recent Labs    06/06/24 0219 06/06/24 0835 06/06/24 1021 06/06/24 1026 06/06/24 1201  WBC 9.8 10.1  --   --  9.8  HGB 9.4* 8.9* 8.8* 9.2* 8.6*  HCT 27.8* 25.6* 26.0* 27.0* 24.7*  PLT 433* 443*  --   --  424*   BMET Recent Labs    06/06/24 0219 06/06/24 0835 06/06/24 1021 06/06/24 1026  06/06/24 1201  NA 140 139 141 141 140  K 2.7* 2.6* 2.4* 2.2* 2.5*  CL 107 107  --  108 108  CO2 14* 14*  --   --  15*  GLUCOSE 160* 117*  --  115* 125*  BUN 76* 84*  --  75* 87*  CREATININE 1.63* 1.65*  --  1.80* 1.58*  CALCIUM  6.8* 6.7*  --   --  6.7*   LFT Recent Labs    06/05/24 1531  PROT 4.3*  ALBUMIN  2.3*  AST 31  ALT 35  ALKPHOS 119  BILITOT 0.5   PT/INR Recent Labs    06/05/24 1531  LABPROT 37.5*  INR 3.6*    STUDIES: ECHOCARDIOGRAM COMPLETE Result Date: 06/06/2024    ECHOCARDIOGRAM REPORT   Patient Name:   Chase Combs Date of Exam: 06/06/2024 Medical Rec #:  [id number]      Height:       72.0 in Accession #:    [id number]     Weight:       160.3 lb Date of Birth:  [birth year]      BSA:          1.939 m Patient Age:    78 years       BP:           109/50 mmHg Patient Gender: M              HR:           80 bpm. Exam Location:  Inpatient Procedure: 2D Echo, Cardiac Doppler, Color Doppler and Intracardiac            Opacification Agent (Both Spectral and Color Flow Doppler were            utilized during procedure). STAT ECHO Indications:    Shock  History:        Patient has prior history of Echocardiogram examinations, most                 recent 02/02/2024. Cardiomyopathy and CHF, CAD and Previous  Myocardial Infarction, Arrythmias:Atrial Fibrillation and Atrial                 Flutter, Signs/Symptoms:Dyspnea and Hypotension; Risk                 Factors:Hypertension, Dyslipidemia and Diabetes.  Sonographer:    Juliene Rucks Referring Phys: [id number] JESSICA MARSHALL  Sonographer Comments: No subcostal window. IMPRESSIONS  1. Left ventricular ejection fraction, by estimation, is 20 to 25%. The left ventricle has severely decreased function. The left ventricle demonstrates global hypokinesis. The left ventricular internal cavity size was mildly dilated. There is mild concentric left ventricular hypertrophy. Left ventricular diastolic function could not be evaluated.   2. Right ventricular systolic function is moderately reduced. The right ventricular size is normal. Tricuspid regurgitation signal is inadequate for assessing PA pressure.  3. The mitral valve is grossly normal. No evidence of mitral valve regurgitation. No evidence of mitral stenosis.  4. The aortic valve is tricuspid. Aortic valve regurgitation is mild. No aortic stenosis is present. Comparison(s): No significant change from prior study. FINDINGS  Left Ventricle: Left ventricular ejection fraction, by estimation, is 20 to 25%. The left ventricle has severely decreased function. The left ventricle demonstrates global hypokinesis. Definity  contrast agent was given IV to delineate the left ventricular endocardial borders. The left ventricular internal cavity size was mildly dilated. There is mild concentric left ventricular hypertrophy. Abnormal (paradoxical) septal motion, consistent with left bundle branch block. Left ventricular diastolic function could not be evaluated due to atrial fibrillation. Left ventricular diastolic function could not be evaluated. Right Ventricle: The right ventricular size is normal. No increase in right ventricular wall thickness. Right ventricular systolic function is moderately reduced. Tricuspid regurgitation signal is inadequate for assessing PA pressure. Left Atrium: Left atrial size was normal in size. Right Atrium: Right atrial size was normal in size. Pericardium: There is no evidence of pericardial effusion. Mitral Valve: The mitral valve is grossly normal. No evidence of mitral valve regurgitation. No evidence of mitral valve stenosis. Tricuspid Valve: The tricuspid valve is grossly normal. Tricuspid valve regurgitation is trivial. No evidence of tricuspid stenosis. Aortic Valve: The aortic valve is tricuspid. Aortic valve regurgitation is mild. No aortic stenosis is present. Pulmonic Valve: The pulmonic valve was grossly normal. Pulmonic valve regurgitation is mild. No  evidence of pulmonic stenosis. Aorta: The aortic root and ascending aorta are structurally normal, with no evidence of dilitation. Venous: The inferior vena cava was not well visualized. IAS/Shunts: The atrial septum is grossly normal.  LEFT VENTRICLE PLAX 2D LVIDd:         6.40 cm   Diastology LVIDs:         5.70 cm   LV e' medial:  4.46 cm/s LV PW:         1.10 cm   LV e' lateral: 7.72 cm/s LV IVS:        1.20 cm LVOT diam:     2.50 cm LV SV:         83 LV SV Index:   43 LVOT Area:     4.91 cm  RIGHT VENTRICLE RV Basal diam:  3.10 cm RV Mid diam:    2.80 cm RV S prime:     17.30 cm/s TAPSE (M-mode): 1.4 cm LEFT ATRIUM           Index        RIGHT ATRIUM          Index LA diam:  3.60 cm 1.86 cm/m   RA Area:     8.43 cm LA Vol (A4C): 32.3 ml 16.66 ml/m  RA Volume:   15.50 ml 7.99 ml/m  AORTIC VALVE LVOT Vmax:   125.00 cm/s LVOT Vmean:  76.700 cm/s LVOT VTI:    0.169 m  AORTA Ao Root diam: 4.10 cm Ao Asc diam:  3.30 cm  SHUNTS Systemic VTI:  0.17 m Systemic Diam: 2.50 cm Darryle Decent MD Electronically signed by Darryle Decent MD Signature Date/Time: 06/06/2024/8:54:16 AM    Final    DG CHEST PORT 1 VIEW Result Date: 06/06/2024 EXAM: 1 VIEW(S) XRAY OF THE CHEST 06/06/2024 06:59:04 AM COMPARISON: 05/19/2024 CLINICAL HISTORY: Central line complication, initial encounter. ICD10: 859.961 - Unspecified injury of blood vessel of thorax, initial encounter. FINDINGS: LINES, TUBES AND DEVICES: Left IJ CVC in place with tip directed superiorly, projecting over right brachiocephalic vein. Enteric tube in place with tip below diaphragm. The CVC tip is malpositioned. Consider immediate repositioning of the CVC. A repeat chest radiograph should be performed after repositioning to confirm appropriate tip placement. LUNGS AND PLEURA: Low lung volumes. Elevated right hemidiaphragm. Similar right perihilar subsegmental atelectasis or scarring. No pleural effusion. No pneumothorax. HEART AND MEDIASTINUM: No acute abnormality  of the cardiac and mediastinal silhouettes. BONES AND SOFT TISSUES: No acute osseous abnormality. IMPRESSION: 1. Left IJ central venous catheter tip projects over the right brachiocephalic vein and is directed cephalad, most consistent with catheter malposition. 2. Enteric tube tip courses below the diaphragm. 3. The urgent finding will be called to the ordering provider by the Professional Radiology Assistants (PRAs) and documented in the [facility] dashboard. Electronically signed by: Waddell Calk MD MD 06/06/2024 07:04 AM EST RP Workstation: [id number]   DG Abd 1 View Result Date: 06/06/2024 EXAM: 1 VIEW XRAY OF THE ABDOMEN 06/06/2024 05:07:56 AM COMPARISON: Comparison study dated 06/05/2024. CLINICAL HISTORY: Encounter for imaging study to confirm nasogastric (NG) tube placement. FINDINGS: LINES, TUBES AND DEVICES: A nasogastric tube has been advanced in the interim and its tip is within the fundus of the stomach with the side port in the mid body of the stomach. BOWEL: There is extensive gaseous distention of the visualized bowel, as before. SOFT TISSUES: No abnormal calcifications. BONES: No acute fracture. IMPRESSION: 1. Nasogastric tube tip within the fundus of the stomach and side port in the mid body of the stomach. 2. Extensive gaseous distention of the visualized bowel, as before. Electronically signed by: Evalene Coho MD MD 06/06/2024 05:16 AM EST RP Workstation: [id number]   DG Abdomen 1 View Result Date: 06/05/2024 CLINICAL DATA:  Enteric catheter placement EXAM: ABDOMEN - 1 VIEW COMPARISON:  06/05/2024 FINDINGS: Frontal view of the lower chest and upper abdomen demonstrates enteric catheter passing below diaphragm, tip projecting over the gastric fundus. Marked gaseous distention of the colon is noted. Lung bases are clear. IMPRESSION: 1. Enteric catheter tip projecting over the gastric fundus. 2. Persistent marked gaseous distension of the colon. Electronically Signed   By: Ozell Daring  M.D.   On: 06/05/2024 22:15   CT CHEST ABDOMEN PELVIS W CONTRAST Result Date: 06/05/2024 EXAM: CT CHEST, ABDOMEN AND PELVIS WITH CONTRAST 06/05/2024 05:36:14 PM TECHNIQUE: CT of the chest, abdomen and pelvis was performed with the administration of 75 mL of iohexol  (OMNIPAQUE ) 350 MG/ML injection. Multiplanar reformatted images are provided for review. Automated exposure control, iterative reconstruction, and/or weight based adjustment of the mA/kV was utilized to reduce the radiation dose to as low as reasonably achievable. COMPARISON:  CTA bleed 01/30/24, CT abdomen and pelvis [id number], CT chest abdomen pelvis [id number] CLINICAL HISTORY: Sepsis. History of prostate cancer. FINDINGS: CHEST: MEDIASTINUM AND LYMPH NODES: Heart and pericardium are unremarkable. The central airways are clear. Secretions are noted at the carina within the proximal right mainstem bronchi. No mediastinal, hilar or axillary lymphadenopathy. The pulmonary artery measures at the upper limits of normal. No central pulmonary embolus. Limited evaluation distally due to timing of contrast. 4-vessel coronary artery calcification. LUNGS AND PLEURA: Expiratory phase of respiration. Patchy peribronchovascular ground-glass airspace opacities of the right lower and posterior right upper lobe. No pleural effusion. No pneumothorax. ABDOMEN AND PELVIS: LIVER: Unremarkable. GALLBLADDER AND BILE DUCTS: Unremarkable. No biliary ductal dilatation. SPLEEN: No acute abnormality. PANCREAS: Diffusely atrophic pancreas. No focal lesion. Otherwise normal pancreatic contour. No surrounding inflammatory changes. No main pancreatic ductal dilatation. ADRENAL GLANDS: No acute abnormality. KIDNEYS, URETERS AND BLADDER: Fluid density lesions of the bilateral kidneys likely represent simple renal cysts. Simple renal cysts do not require additional follow-up unless clinically indicated due to signs/symptoms. Subcentimeter hypodense and hyperdense lesions are too small  to characterize. No stones in the kidneys or ureters. No hydronephrosis. No perinephric or periureteral stranding. Radiation seeds noted along the prostate. The prostate is enlarged in size, measuring up to 5.5 cm, with associated mass effect on the posterior wall of the urinary bladder lumen. Anterior urinary bladder lumen diverticula present. No excretion of intravenous contrast from either kidney on delayed view. GI AND BOWEL: Gaseous dilatation of the colon measuring up to 9 cm with a transition point at the mid sigmoid colon, where there is likely underlying irregular bowel wall thickening in the setting of underdistention/limited evaluation (series 3, images 109-111). Associated air-fluid levels. Associated gaseous dilatation of the ileum measuring up to 6 cm. No pericolonic fat stranding. No pneumatosis. No small bowel thickening. REPRODUCTIVE ORGANS: Radiation seeds noted along the prostate. The prostate is enlarged in size, measuring up to 5.5 cm, with associated mass effect on the posterior wall of the urinary bladder lumen. PERITONEUM AND RETROPERITONEUM: No ascites. No free air. VASCULATURE: Aorta is normal in caliber. Moderate atherosclerotic plaque of the aorta. The thoracic aorta is normal in caliber. ABDOMINAL AND PELVIS LYMPH NODES: No lymphadenopathy. BONES AND SOFT TISSUES: Severe degenerative changes of the right shoulder. Mild degenerative changes of the left shoulder. No acute displaced fracture. No suspicious lytic or blastic osseous lesions. Diffusely decreased bone density. Chronic-appearing T12 intervertebral wedge compression deformity. Multilevel degenerative changes. Question osteoma of the right sacroiliac (series 6, image 36). IMPRESSION: 1. Patchy peribronchovascular ground-glass airspace opacities in the right lower and posterior right upper lobes with airway secretions, suspicious for aspiration or infectious pneumonitis. 2. Large bowel obstruction with redemonstration of findings  suggestive of a mid sigmoid colon malignancy versus stricture. Gaseous dilatation of the colon up to 9 cm with a transition point at the mid sigmoid colon, with associated irregular wall thickening that is poorly evaluated due to underdistention. Recommend GI consultation and possible colonoscopy for further evaluation of likely underlying lesion/stricture. 3. No excretion of intravenous contrast from either kidney on delayed images, which may reflect impaired renal function. 4. Other, non-acute and/or normal findings as above. Electronically signed by: Morgane Naveau MD MD 06/05/2024 06:52 PM EST RP Workstation: [id number]     Patient profile   Chase Combs is a 79 y.o. 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 With sleep apnea, and history of prostate cancer presents for recurrent ileus/ogilvie's and shock   Impression/Plan   Recurrent pseudoobstruction/diffuse ileus  Shock Recurrent admissions with previous management including NG decompression, miralax , relistor , enemas, colonic decompression (01/2024) and was recently improved on pyridostigmine  prior to discharge 06/01/24 now readmitted with hypotension, syncope, nausea/vomiting, abdominal distention and admitted for shock with lactic acidosis thought to be secondary from Ogilvie's and possible aspiration. Patient is unresponsive on physical exam and only responds to painful stimuli with NG tube placed and significant abdominal distention. KUB with persistent gaseous distention. High pressor requirement.  -- consider restarting Mestinon  2 mg IV every 8 hours -- pressors per PCCM -- chronically ill patient with continued progressive and recurrent pseudoobstruction refractory to many regimens. Consider GOC discussion with palliative care.  Obviously not stable enough for decompression at this time. 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   Chase Combs  06/06/2024, 2:52 PM     Attending physician's note   I have taken history from family (pt not responsive), reviewed the chart and examined the patient. I performed a substantive portion of this encounter, including complete performance of at least one of the key components, in conjunction with the APP. I agree with the Advanced Practitioner's note, impression and recommendations.   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  Underlying significant comorbidities including LV EF 20-25%, Afib on eliquis .  Certainly at risk for ischemic bowel.  Plan - Supportive treatment for now. Correct electrolytes K>4, Mg>2 - NG suction - Can always consider Mestinon  2 mg IV Q8hrs. however, can cause hypotension. - Not stable for any endo procedures - Serial x-ray KUBs - Will follow along.   Anselm Bring, MD [facility] [phone]  "

## 2024-06-06 NOTE — Progress Notes (Addendum)
 "  NAME:  Chase Combs, MRN:  [id number], DOB:  [birth year], LOS: 1 ADMISSION DATE:  06/05/2024, CONSULTATION DATE:  06/05/24 REFERRING MD:  EDP, CHIEF COMPLAINT:  syncope  History of Present Illness:  79 yo male presented to [facility] after near syncopal event. Found to be hypotensive. Received 1L fluid and then started on vasopressors, escalated to 15mcg.   Pt has known intestinal blockage and follows with GI. He was recently seen (last week) by GI. Pt reports decreased PO intake for past week with intermittent vomiting. Today he presented after continued malaise and syncopal event. He was found to be hypotensive with SBP in 80's.   Per chart review pt was admitted to hospital for 3 weeks for his chronic obstruction. He was subsequently discharged to SNF and then released home. Pt presented back to hospital 12/26 with similar symptoms and discharged again to home on 1/8. During this admission GI recommended conservative treatment. He was provided with fluids, reglan , NG and rectal tube and continued on Mestinon .   Wife at bedside and endorses that pt has been vomiting at home with poor po intake after the first day. He has been laying down vomiting as well and wife feels aspiration is very possible. He has continued with loose stool since discharge 2/2 medication per wife. Today he has been sleeping all day and non verbal as opposed to yesterday when he was interactive. Denies any sick contacts since he has arrived home. No noted fevers/chills. No complaints of abdominal pain but wife states she has noted his belly becoming increasingly distended.   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   Interim History / Subjective:  Interval: admitted. Line placed, crosses midline and goes up right IJ.  High pressor requirement   NE currently 30 mcg/min VPA 0.02 ->0.04 Wbc: 6.3-->9.8 T max 98.8 Hgb 11.4-->9.4; PLTs 507->433 Na 138->140 K 2.9->2.7 (got 2 runs K and KCL in LR); f/u BMP pending Bicarb 18->14 w/ AG 18->19 (delta: )  Glucose: 123->160 Scr 1.61->1.63 Lactic acid 3.5->2.5->2.0 Objective    Blood pressure (!) 109/54, pulse 75, temperature 98.6 F (37 C), temperature source Bladder, resp. rate (!) 23, height 6' (1.829 m), weight 72.7 kg, SpO2 99%.        Intake/Output Summary (Last 24 hours) at 06/06/2024 0905 Last data filed at 06/06/2024 0500 Gross per 24 hour  Intake 3251.26 ml  Output 50 ml  Net 3201.26 ml   Filed Weights   06/05/24 1458 06/06/24 0445  Weight: 71.2 kg 72.7 kg    Examination: General General Chronically ill cachectic minimally responsive 79 year old male patient lying in bed currently on high dose pressors HEENT normocephalic atraumatic pupils equal reactive mucous membranes dry Pulmonary currently on room air with mild labored respiratory efforts, decreased bases.  PCXR: chronically elevated right HD. Left internal jugular crosses and goes to left Brachiocephalic Vein basilar vol loss  Cardiac: Regular rate and rhythm Abdomen soft, hypoactive, distended, NG tube at low intermittent suction GU concentrated yellow urine Extremities cool, cap refill less than 3 seconds, does have scattered areas of ecchymosis upper extremities and dependent edema in the legs and hands Neuro minimally  responsive.  Withdraws to pain only.  Not vocal Resolved problem list  Syncope  Assessment and Plan   Undifferentiated shock w/ lactic acidosis. Working dx: sepsis w/ hypovolemia as consequence of recurrent psuedo-obstruction of colon (Olgilvie syndrome), dehydration,  probable aspiration +/- gut translocation?  Plan Keep euvolemic Wean norepinephrine  for mean arterial pressure greater than 65 Vasopressin  at 0.04 Transduce central venous catheter after central line replaced, current left IJ access point terminates in the right brachiocephalic Obtain Co. 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  Plan NPO Cont LIWS PRN zofran   PPI GI to re-eval   HFrEF (LVEF 20-25%) LVEF remains 20-25% W/ GLOBAL HYPOKINESIS, LV is dilated. MV fxn mod. reduced. TAPSE (M-mode): 1.4 cm  Plan Ensure mean arterial pressure greater than 65 Hold any antihypertensives or diuretics Continue telemetry monitoring  Severe metabolic encephalopathy  2.2 sepsis acidosis and renal failure Plan Supportive care  Aki (baseline 0.7), with worsening metabolic acidosis.  Mixed NAGA and AGA Scr had been climbing, bicarbonate is following in spite of improved lactate.  His delta gap calculates to 0.6 which would be consistent with a mixed gap and nongap process Plan Renal dose meds Ensure MAP > 65 Keep euvolemic Strict I&O Serial chems Would probably benefit from bicarbonate replacement however potassium is critically low so we will hold off for now  Fluid and electrolyte imbalance: hypokalemia Plan Replace both K and Mg aggressively give obstruction, have written for 6 more runs Close obs of serial chems   Failure to thrive, cachectic, DNR/DNI Plan Supportive care  On-going GOC discussion  If does not respond to current therapies may be looking at comfort  Chronic normocytic anemia Hgb down to 9.4 from 11.4. I think this reflects prior hemoconcentrated state. No evidence of bleeding Plan Serial CBCs Trigger  for transfusion < 8 given his cardiac disease.   Thrombocytosis Plan Trend  DTI (POA) Plan See WOC consult 1/13    Critical care time:   I personally  spent 34 minutes  on this patient which included: review of medical records, nursing notes, progress notes, evaluation, interpretation of lab data and diagnostic studies, taking independent history, performing exam, documenting plan, ordering diagnostics and interventions for the following critical care issues: Circulatory shock with the following interventions which included: titration of hemodynamic drips to desired MAP, prevention of further deterioration             "

## 2024-06-09 ENCOUNTER — Ambulatory Visit (HOSPITAL_COMMUNITY)

## 2024-06-10 LAB — CULTURE, BLOOD (ROUTINE X 2): Culture: NO GROWTH

## 2024-06-11 LAB — CULTURE, BLOOD (ROUTINE X 2)

## 2024-06-25 NOTE — Death Summary Note (Signed)
 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 w/ nausea and vomiting  HFrEF (LVEF 20-25%) acute metabolic encephalopathy Seizure  Aki  Non anion gap metabolic acidosis  Anion gap metabolic acidosis  Fluid and electrolyte imbalance: hypokalemia Failure to thrive Severe protein calorie malnutrition  DNR/DNI Chronic normocytic anemia Thrombocytosis Deep tissue injury History of atrial fib History of CAD w/ stents   Condition on arrival (check all that apply)  Arrived to ER in septic shock with multiple organ failure, and lactic acidosis   Hospital course    79 yo male presented to [facility] after near syncopal event at home. On EMS arrival Found to be hypotensive. Received 1L fluid, transferred to the ER, started on vasopressor support and required rapid escalation of pressors.   Pt has known intestinal blockage and follows with GI. He was recently seen by GI (during last hospital course & just discharged on 1/8. Presented with decreased PO intake for past week with intermittent vomiting.On day of admit Today EMS called due to continued malaise and syncopal event. He was found to be hypotensive with SBP in 80's.    Wife at bedside and endorses that pt has been vomiting at home with poor po intake after the first day. He has been laying down vomiting as well and wife feels aspiration is very possible. He has continued with loose stool since discharge 2/2 medication per wife. On day of admit had  been sleeping all day and non verbal as opposed to yesterday when he was interactive. Denied any sick contacts since he has arrived home. No noted fevers/chills. No complaints of abdominal pain but wife states she has noted his belly becoming increasingly distended.   He was admitted to the Intensive care. Care included: IV resuscitation, broad spectrum antibiotics, Central access and arterial line insertion and titration of both norepinephrine  and vasopressin . His course was complicated by acute metabolic encephalopathy (due to sepsis), AKI, severe metabolic acidosis and hypokalemia. GI was called to assist with recommendations in regard to the recurrent olgilvies syndrome that was the cause of his obstruction and subsequent aspiration event. Recommendations were made.   On the evening of 06-15-24 patient had witnessed tonic clonic seizures. This lasted less than 30 seconds. It was complicated by worsening hypoxia and increased work of breathing. He was already a DNI and DNR per family request.. Keppra  started. Supplemental oxygen administered and family contacted. The decision was made to make him comfort care. He passed away in the intensive care at 0057.   Jeralyn FORBES Banner, NP   @SIGNATURE @

## 2024-06-25 DEATH — deceased

## 2024-06-26 ENCOUNTER — Ambulatory Visit: Admitting: Gastroenterology
